# Patient Record
Sex: Female | Born: 1935 | Race: White | Hispanic: No | Smoking: Former smoker
Health system: Southern US, Academic
[De-identification: ages and names within clinical notes are randomized; demographics above are authoritative.]

## PROBLEM LIST (undated history)

## (undated) DIAGNOSIS — I499 Cardiac arrhythmia, unspecified: Secondary | ICD-10-CM

## (undated) DIAGNOSIS — I509 Heart failure, unspecified: Secondary | ICD-10-CM

## (undated) DIAGNOSIS — K449 Diaphragmatic hernia without obstruction or gangrene: Secondary | ICD-10-CM

## (undated) DIAGNOSIS — M1990 Unspecified osteoarthritis, unspecified site: Secondary | ICD-10-CM

## (undated) DIAGNOSIS — E079 Disorder of thyroid, unspecified: Secondary | ICD-10-CM

## (undated) DIAGNOSIS — K219 Gastro-esophageal reflux disease without esophagitis: Secondary | ICD-10-CM

## (undated) DIAGNOSIS — I2699 Other pulmonary embolism without acute cor pulmonale: Secondary | ICD-10-CM

## (undated) DIAGNOSIS — I10 Essential (primary) hypertension: Secondary | ICD-10-CM

## (undated) DIAGNOSIS — Z7901 Long term (current) use of anticoagulants: Secondary | ICD-10-CM

## (undated) DIAGNOSIS — I4891 Unspecified atrial fibrillation: Secondary | ICD-10-CM

## (undated) DIAGNOSIS — R1310 Dysphagia, unspecified: Secondary | ICD-10-CM

## (undated) DIAGNOSIS — J302 Other seasonal allergic rhinitis: Secondary | ICD-10-CM

## (undated) DIAGNOSIS — I739 Peripheral vascular disease, unspecified: Secondary | ICD-10-CM

## (undated) DIAGNOSIS — F0390 Unspecified dementia without behavioral disturbance: Secondary | ICD-10-CM

## (undated) HISTORY — DX: Unspecified atrial fibrillation: I48.91

## (undated) HISTORY — DX: Heart failure, unspecified: I50.9

## (undated) HISTORY — DX: Dysphagia, unspecified: R13.10

## (undated) HISTORY — DX: Disorder of thyroid, unspecified: E07.9

## (undated) HISTORY — PX: COLONOSCOPY: SHX174

## (undated) HISTORY — PX: ELBOW SURGERY: SHX618

## (undated) HISTORY — DX: Other pulmonary embolism without acute cor pulmonale: I26.99

## (undated) HISTORY — DX: Diaphragmatic hernia without obstruction or gangrene: K44.9

## (undated) HISTORY — DX: Cardiac arrhythmia, unspecified: I49.9

## (undated) HISTORY — DX: Gastro-esophageal reflux disease without esophagitis: K21.9

## (undated) HISTORY — DX: Essential (primary) hypertension: I10

## (undated) HISTORY — DX: Long term (current) use of anticoagulants: Z79.01

## (undated) HISTORY — PX: LAPAROSCOPIC HYSTERECTOMY: SHX1926

## (undated) HISTORY — DX: Unspecified osteoarthritis, unspecified site: M19.90

## (undated) HISTORY — DX: Other seasonal allergic rhinitis: J30.2

## (undated) HISTORY — DX: Heart failure, unspecified (CMS HCC): I50.9

## (undated) HISTORY — PX: COLONOSCOPY: WVUENDOPRO10

## (undated) HISTORY — DX: Other pulmonary embolism without acute cor pulmonale (CMS HCC): I26.99

## (undated) HISTORY — DX: Unspecified atrial fibrillation (CMS HCC): I48.91

## (undated) HISTORY — PX: HX HYSTERECTOMY: SHX81

## (undated) HISTORY — PX: COLONOSCOPY, DIAGNOSTIC (SCREENING): SHX174

## (undated) HISTORY — PX: OTHER SURGICAL HISTORY: SHX169

## (undated) HISTORY — PX: HYSTERECTOMY: SHX81

---

## 1993-04-30 ENCOUNTER — Emergency Department: Admit: 1993-04-30 | Disposition: A | Discharge status: Home / Self Care | Payer: Self-pay | Source: Ambulatory Visit

## 1995-06-12 ENCOUNTER — Ambulatory Visit (INDEPENDENT_AMBULATORY_CARE_PROVIDER_SITE_OTHER): Admit: 1995-06-12 | Disposition: A | Discharge status: Home / Self Care | Payer: Self-pay | Source: Ambulatory Visit

## 1995-08-26 ENCOUNTER — Ambulatory Visit: Admit: 1995-08-26 | Disposition: A | Discharge status: Home / Self Care | Payer: Self-pay | Source: Ambulatory Visit

## 1995-09-30 ENCOUNTER — Ambulatory Visit: Admit: 1995-09-30 | Disposition: A | Discharge status: Home / Self Care | Payer: Self-pay | Source: Ambulatory Visit

## 1995-11-24 ENCOUNTER — Inpatient Hospital Stay
Admission: EM | Admit: 1995-11-24 | Disposition: A | Discharge status: Home / Self Care | Payer: Self-pay | Source: Ambulatory Visit

## 1996-08-17 ENCOUNTER — Ambulatory Visit: Admit: 1996-08-17 | Disposition: A | Discharge status: Home / Self Care | Payer: Self-pay | Source: Ambulatory Visit

## 1997-09-18 ENCOUNTER — Ambulatory Visit: Admit: 1997-09-18 | Disposition: A | Discharge status: Home / Self Care | Payer: Self-pay | Source: Ambulatory Visit

## 1998-10-24 ENCOUNTER — Ambulatory Visit
Admission: RE | Admit: 1998-10-24 | Disposition: A | Discharge status: Home / Self Care | Payer: Self-pay | Source: Ambulatory Visit

## 1998-11-06 ENCOUNTER — Ambulatory Visit: Admit: 1998-11-06 | Disposition: A | Discharge status: Home / Self Care | Payer: Self-pay | Source: Ambulatory Visit

## 1998-12-06 ENCOUNTER — Ambulatory Visit
Admission: RE | Admit: 1998-12-06 | Disposition: A | Discharge status: Home / Self Care | Payer: Self-pay | Source: Ambulatory Visit

## 1999-06-27 ENCOUNTER — Ambulatory Visit
Admission: RE | Admit: 1999-06-27 | Disposition: A | Discharge status: Home / Self Care | Payer: Self-pay | Source: Ambulatory Visit

## 1999-12-15 ENCOUNTER — Inpatient Hospital Stay
Admission: EM | Admit: 1999-12-15 | Disposition: A | Discharge status: Home / Self Care | Payer: Self-pay | Source: Ambulatory Visit

## 2000-01-07 ENCOUNTER — Ambulatory Visit
Admission: RE | Admit: 2000-01-07 | Disposition: A | Discharge status: Home / Self Care | Payer: Self-pay | Source: Ambulatory Visit

## 2000-01-13 ENCOUNTER — Ambulatory Visit
Admission: RE | Admit: 2000-01-13 | Disposition: A | Discharge status: Home / Self Care | Payer: Self-pay | Source: Ambulatory Visit

## 2000-02-20 ENCOUNTER — Ambulatory Visit
Admission: RE | Admit: 2000-02-20 | Disposition: A | Discharge status: Home / Self Care | Payer: Self-pay | Source: Ambulatory Visit

## 2000-03-16 ENCOUNTER — Ambulatory Visit: Admit: 2000-03-16 | Disposition: A | Discharge status: Home / Self Care | Payer: Self-pay | Source: Ambulatory Visit

## 2000-10-07 ENCOUNTER — Ambulatory Visit
Admission: RE | Admit: 2000-10-07 | Disposition: A | Discharge status: Home / Self Care | Payer: Self-pay | Source: Ambulatory Visit

## 2000-12-23 ENCOUNTER — Ambulatory Visit
Admission: RE | Admit: 2000-12-23 | Disposition: A | Discharge status: Home / Self Care | Payer: Self-pay | Source: Ambulatory Visit

## 2001-01-07 ENCOUNTER — Ambulatory Visit
Admission: RE | Admit: 2001-01-07 | Disposition: A | Discharge status: Home / Self Care | Payer: Self-pay | Source: Ambulatory Visit

## 2001-01-13 ENCOUNTER — Ambulatory Visit
Admission: RE | Admit: 2001-01-13 | Disposition: A | Discharge status: Home / Self Care | Payer: Self-pay | Source: Ambulatory Visit

## 2002-01-20 ENCOUNTER — Ambulatory Visit: Admit: 2002-01-20 | Disposition: A | Discharge status: Home / Self Care | Payer: Self-pay | Source: Ambulatory Visit

## 2002-02-07 ENCOUNTER — Ambulatory Visit
Admission: RE | Admit: 2002-02-07 | Disposition: A | Discharge status: Home / Self Care | Payer: Self-pay | Source: Ambulatory Visit

## 2002-02-18 ENCOUNTER — Ambulatory Visit
Admission: RE | Admit: 2002-02-18 | Disposition: A | Discharge status: Home / Self Care | Payer: Self-pay | Source: Ambulatory Visit

## 2002-08-05 ENCOUNTER — Emergency Department
Admission: EM | Admit: 2002-08-05 | Disposition: A | Discharge status: Home / Self Care | Payer: Self-pay | Source: Ambulatory Visit

## 2003-04-03 ENCOUNTER — Ambulatory Visit
Admission: RE | Admit: 2003-04-03 | Disposition: A | Discharge status: Home / Self Care | Payer: Self-pay | Source: Ambulatory Visit

## 2003-05-17 ENCOUNTER — Ambulatory Visit
Admission: RE | Admit: 2003-05-17 | Disposition: A | Discharge status: Home / Self Care | Payer: Self-pay | Source: Ambulatory Visit

## 2003-06-07 ENCOUNTER — Ambulatory Visit
Admission: RE | Admit: 2003-06-07 | Disposition: A | Discharge status: Home / Self Care | Payer: Self-pay | Source: Ambulatory Visit

## 2003-09-06 ENCOUNTER — Emergency Department
Admission: EM | Admit: 2003-09-06 | Disposition: A | Discharge status: Home / Self Care | Payer: Self-pay | Source: Ambulatory Visit

## 2004-02-23 ENCOUNTER — Ambulatory Visit (INDEPENDENT_AMBULATORY_CARE_PROVIDER_SITE_OTHER): Admit: 2004-02-23 | Disposition: A | Discharge status: Home / Self Care | Payer: Self-pay | Source: Ambulatory Visit

## 2004-03-11 ENCOUNTER — Ambulatory Visit (INDEPENDENT_AMBULATORY_CARE_PROVIDER_SITE_OTHER): Admit: 2004-03-11 | Disposition: A | Discharge status: Home / Self Care | Payer: Self-pay | Source: Ambulatory Visit

## 2004-07-01 ENCOUNTER — Ambulatory Visit
Admission: RE | Admit: 2004-07-01 | Disposition: A | Discharge status: Home / Self Care | Payer: Self-pay | Source: Ambulatory Visit

## 2004-11-21 ENCOUNTER — Ambulatory Visit
Admission: RE | Admit: 2004-11-21 | Disposition: A | Discharge status: Home / Self Care | Payer: Self-pay | Source: Ambulatory Visit

## 2004-12-14 ENCOUNTER — Ambulatory Visit (INDEPENDENT_AMBULATORY_CARE_PROVIDER_SITE_OTHER): Admit: 2004-12-14 | Disposition: A | Discharge status: Home / Self Care | Payer: Self-pay | Source: Ambulatory Visit

## 2004-12-18 ENCOUNTER — Ambulatory Visit: Admit: 2004-12-18 | Disposition: A | Discharge status: Home / Self Care | Payer: Self-pay | Source: Ambulatory Visit

## 2005-04-27 ENCOUNTER — Emergency Department
Admission: EM | Admit: 2005-04-27 | Disposition: A | Discharge status: Home / Self Care | Payer: Self-pay | Source: Ambulatory Visit

## 2005-06-09 ENCOUNTER — Ambulatory Visit (INDEPENDENT_AMBULATORY_CARE_PROVIDER_SITE_OTHER): Admit: 2005-06-09 | Disposition: A | Discharge status: Home / Self Care | Payer: Self-pay | Source: Ambulatory Visit

## 2005-07-23 ENCOUNTER — Ambulatory Visit
Admission: RE | Admit: 2005-07-23 | Disposition: A | Discharge status: Home / Self Care | Payer: Self-pay | Source: Ambulatory Visit

## 2005-08-06 ENCOUNTER — Ambulatory Visit
Admission: RE | Admit: 2005-08-06 | Disposition: A | Discharge status: Home / Self Care | Payer: Self-pay | Source: Ambulatory Visit

## 2006-05-25 ENCOUNTER — Ambulatory Visit: Admit: 2006-05-25 | Disposition: A | Discharge status: Home / Self Care | Payer: Self-pay | Source: Ambulatory Visit

## 2006-06-01 ENCOUNTER — Ambulatory Visit
Admission: RE | Admit: 2006-06-01 | Disposition: A | Discharge status: Home / Self Care | Payer: Self-pay | Source: Ambulatory Visit

## 2006-06-15 ENCOUNTER — Ambulatory Visit
Admission: RE | Admit: 2006-06-15 | Disposition: A | Discharge status: Home / Self Care | Payer: Self-pay | Source: Ambulatory Visit

## 2006-10-28 ENCOUNTER — Ambulatory Visit
Admission: RE | Admit: 2006-10-28 | Disposition: A | Discharge status: Home / Self Care | Payer: Self-pay | Source: Ambulatory Visit

## 2007-01-18 ENCOUNTER — Ambulatory Visit
Admission: RE | Admit: 2007-01-18 | Disposition: A | Discharge status: Home / Self Care | Payer: Self-pay | Source: Ambulatory Visit

## 2007-01-22 ENCOUNTER — Inpatient Hospital Stay
Admission: EM | Admit: 2007-01-22 | Disposition: A | Discharge status: Home / Self Care | Payer: Self-pay | Source: Ambulatory Visit

## 2007-04-13 ENCOUNTER — Ambulatory Visit
Admission: RE | Admit: 2007-04-13 | Disposition: A | Discharge status: Home / Self Care | Payer: Self-pay | Source: Ambulatory Visit

## 2007-05-06 ENCOUNTER — Ambulatory Visit
Admission: RE | Admit: 2007-05-06 | Disposition: A | Discharge status: Home / Self Care | Payer: Self-pay | Source: Ambulatory Visit

## 2007-06-25 ENCOUNTER — Observation Stay
Admission: EM | Admit: 2007-06-25 | Disposition: A | Discharge status: Home / Self Care | Payer: Self-pay | Source: Ambulatory Visit

## 2007-07-25 ENCOUNTER — Emergency Department
Admission: EM | Admit: 2007-07-25 | Disposition: A | Discharge status: Home / Self Care | Payer: Self-pay | Source: Ambulatory Visit

## 2007-08-24 ENCOUNTER — Emergency Department
Admission: EM | Admit: 2007-08-24 | Disposition: A | Discharge status: Home / Self Care | Payer: Self-pay | Source: Ambulatory Visit

## 2007-08-26 ENCOUNTER — Observation Stay
Admission: RE | Admit: 2007-08-26 | Disposition: A | Discharge status: Home / Self Care | Payer: Self-pay | Source: Ambulatory Visit

## 2008-08-21 ENCOUNTER — Ambulatory Visit
Admission: RE | Admit: 2008-08-21 | Disposition: A | Discharge status: Home / Self Care | Payer: Self-pay | Source: Ambulatory Visit

## 2008-09-24 ENCOUNTER — Observation Stay
Admission: EM | Admit: 2008-09-24 | Disposition: A | Discharge status: Home / Self Care | Payer: Self-pay | Source: Ambulatory Visit | Admitting: Internal Medicine

## 2008-11-16 ENCOUNTER — Ambulatory Visit (INDEPENDENT_AMBULATORY_CARE_PROVIDER_SITE_OTHER): Admit: 2008-11-16 | Disposition: A | Discharge status: Home / Self Care | Payer: Self-pay | Source: Ambulatory Visit

## 2008-12-25 DIAGNOSIS — E7800 Pure hypercholesterolemia, unspecified: Secondary | ICD-10-CM | POA: Insufficient documentation

## 2008-12-25 DIAGNOSIS — F411 Generalized anxiety disorder: Secondary | ICD-10-CM | POA: Insufficient documentation

## 2008-12-25 DIAGNOSIS — E039 Hypothyroidism, unspecified: Secondary | ICD-10-CM | POA: Diagnosis present

## 2008-12-25 DIAGNOSIS — K219 Gastro-esophageal reflux disease without esophagitis: Secondary | ICD-10-CM | POA: Diagnosis present

## 2009-04-04 DIAGNOSIS — J449 Chronic obstructive pulmonary disease, unspecified: Secondary | ICD-10-CM | POA: Insufficient documentation

## 2009-04-19 ENCOUNTER — Ambulatory Visit
Admission: RE | Admit: 2009-04-19 | Disposition: A | Discharge status: Home / Self Care | Payer: Self-pay | Source: Ambulatory Visit

## 2009-05-08 ENCOUNTER — Ambulatory Visit
Admission: RE | Admit: 2009-05-08 | Disposition: A | Discharge status: Home / Self Care | Payer: Self-pay | Source: Ambulatory Visit

## 2009-05-25 ENCOUNTER — Inpatient Hospital Stay
Admission: EM | Admit: 2009-05-25 | Disposition: A | Discharge status: Home / Self Care | Payer: Self-pay | Source: Ambulatory Visit | Admitting: Internal Medicine

## 2009-06-19 ENCOUNTER — Ambulatory Visit
Admission: RE | Admit: 2009-06-19 | Disposition: A | Discharge status: Home / Self Care | Payer: Self-pay | Source: Ambulatory Visit

## 2009-06-20 DIAGNOSIS — I4821 Permanent atrial fibrillation: Secondary | ICD-10-CM | POA: Diagnosis present

## 2009-09-19 ENCOUNTER — Ambulatory Visit
Admission: RE | Admit: 2009-09-19 | Disposition: A | Discharge status: Home / Self Care | Payer: Self-pay | Source: Ambulatory Visit

## 2010-05-06 ENCOUNTER — Ambulatory Visit
Admission: RE | Admit: 2010-05-06 | Disposition: A | Discharge status: Home / Self Care | Payer: Self-pay | Source: Ambulatory Visit

## 2010-05-27 ENCOUNTER — Ambulatory Visit: Admit: 2010-05-27 | Disposition: A | Discharge status: Home / Self Care | Payer: Self-pay | Source: Ambulatory Visit

## 2010-07-25 ENCOUNTER — Ambulatory Visit (INDEPENDENT_AMBULATORY_CARE_PROVIDER_SITE_OTHER): Admit: 2010-07-25 | Disposition: A | Discharge status: Home / Self Care | Payer: Self-pay | Source: Ambulatory Visit

## 2010-07-25 ENCOUNTER — Emergency Department
Admission: EM | Admit: 2010-07-25 | Disposition: A | Discharge status: Home / Self Care | Payer: Self-pay | Source: Ambulatory Visit

## 2011-08-07 ENCOUNTER — Ambulatory Visit
Admission: RE | Admit: 2011-08-07 | Disposition: A | Discharge status: Home / Self Care | Payer: Self-pay | Source: Ambulatory Visit

## 2013-10-14 LAB — ECG 12-LEAD
P Wave Axis: 56 deg
P Wave Axis: 68 deg
P Wave Axis: -40 deg
P Wave Axis: -66 deg
P Wave Axis: 65 deg
P Wave Duration: 102 ms
P Wave Duration: 118 ms
P Wave Duration: 128 ms
P Wave Duration: 76 ms
P Wave Duration: 82 ms
P-R Interval: 158 ms
P-R Interval: 170 ms
P-R Interval: 116 ms
P-R Interval: 124 ms
P-R Interval: 166 ms
Patient Age: 80 years
Patient Age: 80 years
Patient Age: 81 years
Patient Age: 81 years
Patient Age: 81 years
Patient Age: 81 years
Patient Age: 82 years
Q-T Dispersion: 14 ms
Q-T Dispersion: 26 ms
Q-T Dispersion: 18 ms
Q-T Dispersion: 28 ms
Q-T Dispersion: 32 ms
Q-T Dispersion: 38 ms
Q-T Dispersion: 50 ms
Q-T Interval(Corrected): 414 ms
Q-T Interval(Corrected): 442 ms
Q-T Interval(Corrected): 430 ms
Q-T Interval(Corrected): 449 ms
Q-T Interval(Corrected): 453 ms
Q-T Interval(Corrected): 460 ms
Q-T Interval(Corrected): 448 ms
Q-T Interval: 414 ms
Q-T Interval: 448 ms
Q-T Interval: 280 ms
Q-T Interval: 436 ms
Q-T Interval: 476 ms
Q-T Interval: 480 ms
Q-T Interval: 452 ms
QRS Axis: -24 deg
QRS Axis: -4 deg
QRS Axis: -1 deg
QRS Axis: -12 deg
QRS Axis: -13 deg
QRS Axis: -21 deg
QRS Axis: -4 deg
QRS Duration: 88 ms
QRS Duration: 90 ms
QRS Duration: 78 ms
QRS Duration: 84 ms
QRS Duration: 86 ms
QRS Duration: 90 ms
QRS Duration: 92 ms
T Axis: 3 deg
T Axis: 6 deg
T Axis: -1 deg
T Axis: 33 deg
T Axis: 37 deg
T Axis: 8 deg
T Axis: 11 deg
Ventricular Rate: 57 /min
Ventricular Rate: 60 /min
Ventricular Rate: 157 /min
Ventricular Rate: 45 /min
Ventricular Rate: 51 /min
Ventricular Rate: 57 /min
Ventricular Rate: 58 /min

## 2014-03-29 ENCOUNTER — Emergency Department: Payer: Medicare Other

## 2014-03-29 ENCOUNTER — Emergency Department
Admission: EM | Admit: 2014-03-29 | Discharge: 2014-03-29 | Disposition: A | Discharge status: Home / Self Care | Payer: Medicare Other | Attending: Emergency Medicine | Admitting: Emergency Medicine

## 2014-03-29 DIAGNOSIS — M25559 Pain in unspecified hip: Secondary | ICD-10-CM | POA: Insufficient documentation

## 2014-03-29 DIAGNOSIS — M5431 Sciatica, right side: Secondary | ICD-10-CM

## 2014-03-29 DIAGNOSIS — I10 Essential (primary) hypertension: Secondary | ICD-10-CM | POA: Insufficient documentation

## 2014-03-29 DIAGNOSIS — M5430 Sciatica, unspecified side: Secondary | ICD-10-CM | POA: Insufficient documentation

## 2014-03-29 HISTORY — DX: Essential (primary) hypertension: I10

## 2014-03-29 HISTORY — DX: Other pulmonary embolism without acute cor pulmonale: I26.99

## 2014-03-29 MED ORDER — LIDOCAINE 5 % EX PTCH
1.0000 | MEDICATED_PATCH | Freq: Every day | CUTANEOUS | Status: DC
Start: 2014-03-29 — End: 2014-05-03

## 2014-03-29 MED ORDER — GABAPENTIN 100 MG PO CAPS
200.0000 mg | ORAL_CAPSULE | Freq: Three times a day (TID) | ORAL | Status: DC
Start: 2014-03-29 — End: 2014-03-29

## 2014-03-29 MED ORDER — HYDROMORPHONE HCL 2 MG PO TABS
2.0000 mg | ORAL_TABLET | ORAL | Status: DC | PRN
Start: 2014-03-29 — End: 2014-03-29
  Administered 2014-03-29: 2 mg via ORAL

## 2014-03-29 MED ORDER — GABAPENTIN 300 MG PO CAPS
300.0000 mg | ORAL_CAPSULE | Freq: Two times a day (BID) | ORAL | Status: DC
Start: 2014-03-29 — End: 2014-05-05

## 2014-03-29 MED ORDER — PROMETHAZINE HCL 25 MG PO TABS
12.5000 mg | ORAL_TABLET | Freq: Once | ORAL | Status: AC
Start: 2014-03-29 — End: 2014-03-29
  Administered 2014-03-29: 12.5 mg via ORAL

## 2014-03-29 MED ORDER — HYDROMORPHONE HCL 2 MG PO TABS
ORAL_TABLET | ORAL | Status: AC
Start: 2014-03-29 — End: ?
  Filled 2014-03-29: qty 1

## 2014-03-29 MED ORDER — PROMETHAZINE HCL 25 MG PO TABS
ORAL_TABLET | ORAL | Status: AC
Start: 2014-03-29 — End: ?
  Filled 2014-03-29: qty 1

## 2014-03-29 NOTE — ED Notes (Signed)
Bed: E50-A  Expected date:   Expected time:   Means of arrival:   Comments:  Ems

## 2014-03-29 NOTE — ED Notes (Signed)
Pt has been having right hip pain x1 month. Pt was seen by PCP and dx with arthritis last week. Pt has been started on Gabapentin. Today pt has had increasing right hip pain that radiates down leg. Pt states she thinks its sciatica pain and not related to arthritis. Pt has no hx of sciatica. Pt walks with walker.

## 2014-03-29 NOTE — ED Provider Notes (Addendum)
Physician/Midlevel provider first contact with patient: 03/29/14 1718         History     Chief Complaint   Patient presents with   . Hip Pain     HPI Comments: Pt has had right hip pain throughout the past 2 months that has begun worsening today. The pain originates in her right buttock. The pain radiates down to her right ankle through her right thigh. The pain feels like burning. Pt has chronic ankle swelling. Pt has 2 bad knees. Pt has bad circulation. Pt especially pained by bearing weight. Certain positions have relieved the pain somewhat. Pt has not tried anything for her pain. She has been using a walker. She is not sure whether it is due to the way she lied down to sleep last night. No back pain. No leg numbness. Pt is on a blood thinner, so she normally does not take pain medicine. Pt has had blood clots in her leg and lungs. Pt has had an allergic reaction to a certain type of pain relieving patch.     Patient is a 80 y.o. female presenting with hip pain. The history is provided by the patient.   Hip Pain  This is a new problem. The current episode started more than 1 month ago (2 months). The problem occurs constantly. The problem has been gradually worsening. Pertinent negatives include no joint swelling, numbness or urinary symptoms. The symptoms are aggravated by walking and standing (bearing weight, certain positions). She has tried position changes for the symptoms. The treatment provided mild relief.         Past Medical History   Diagnosis Date   . HTN (hypertension)    . Pulmonary embolism        Past Surgical History   Procedure Laterality Date   . Left arm     . Hysterectomy         History reviewed. No pertinent family history.    Social  History   Substance Use Topics   . Smoking status: Never Smoker    . Smokeless tobacco: Never Used   . Alcohol Use: No       .     Allergies   Allergen Reactions   . Codeine    . Erythromycin    . Iodine    . Penicillins    . Sulfa Antibiotics    . Tramadol     . Zithromax [Azithromycin]        Discharge Medication List as of 03/29/2014  8:21 PM      CONTINUE these medications which have NOT CHANGED    Details   levothyroxine (SYNTHROID, LEVOTHROID) 75 MCG tablet Take 75 mcg by mouth Once a day at 6:00am., Until Discontinued, Historical Med      losartan (COZAAR) 100 MG tablet Take 100 mg by mouth daily., Until Discontinued, Historical Med      metoprolol (LOPRESSOR) 50 MG tablet Take 50 mg by mouth 2 (two) times daily., Until Discontinued, Historical Med      pantoprazole (PROTONIX) 40 MG tablet Take 40 mg by mouth daily., Until Discontinued, Historical Med      warfarin (COUMADIN) 2.5 MG tablet Take 2.5 mg by mouth daily., Until Discontinued, Historical Med              Review of Systems   Genitourinary: Negative for difficulty urinating.   Musculoskeletal: Positive for gait problem. Negative for back pain and joint swelling.  Pt can barely walk due to pain. She has been using a walker. Chronic ankle swelling. Pt has had right hip pain throughout the past 2 months that has begun worsening today. The pain originates in her right buttock. The pain radiates down to her right ankle through her right thigh. The pain feels like burning. Chronic ankle swelling    Neurological: Negative for numbness.   All other systems reviewed and are negative.      Physical Exam    BP: 184/91 mmHg, Heart Rate: 94, Temp: 98.6 F (37 C), Resp Rate: 16, SpO2: 97 %    Physical Exam   Constitutional: She is oriented to person, place, and time. She appears well-developed and well-nourished. No distress.   HENT:   Head: Normocephalic and atraumatic.   Right Ear: External ear normal.   Left Ear: External ear normal.   Nose: Nose normal.   Mouth/Throat: Oropharynx is clear and moist. No oropharyngeal exudate.   Eyes: Conjunctivae and EOM are normal. Right eye exhibits no discharge. Left eye exhibits no discharge. No scleral icterus.   Neck: Neck supple. No tracheal deviation present. No  thyromegaly present.   Cardiovascular: Normal rate, regular rhythm and intact distal pulses.  Exam reveals no gallop and no friction rub.    No murmur heard.  Chronic veinous stasis changes. Diminished distal pulses in both feet   Pulmonary/Chest: Effort normal and breath sounds normal. No stridor. No respiratory distress. She has no wheezes. She has no rales. She exhibits no tenderness.   Abdominal: Bowel sounds are normal. She exhibits no distension and no mass. There is no tenderness. There is no rebound and no guarding.   Musculoskeletal: She exhibits tenderness. She exhibits no edema.   Tender in the buttock. Pain with straight leg raising in right leg. Pain with back flexion and rotation.    Lymphadenopathy:     She has no cervical adenopathy.   Neurological: She is alert and oriented to person, place, and time. She exhibits normal muscle tone. Coordination normal.   Skin: Skin is warm and dry. No rash noted. She is not diaphoretic. No erythema. No pallor.   Feet cold to touch. hyperpigmentary changes in her lower legs   Psychiatric: She has a normal mood and affect. Her behavior is normal. Thought content normal.   Pt is difficult to try to medicate due to her numerous allergies. Pt says physical therapy does not work. She was told that she could not have a nerve stimulator due to her coumadin regimen.    Nursing note and vitals reviewed.      MDM and ED Course     ED Medication Orders    Start     Status Ordering Provider    03/29/14 1732  promethazine (PHENERGAN) tablet 12.5 mg   Once in ED     Route: Oral  Ordered Dose: 12.5 mg     Last MAR action:  Given Pari Lombard, Kristine Garbe    03/29/14 1731  HYDROmorphone (DILAUDID) tablet 2 mg   Every 3 hours PRN     Route: Oral  Ordered Dose: 2 mg     Last MAR action:  Given Sylvana Bonk, Kristine Garbe           MDM  Number of Diagnoses or Management Options  Sciatica, right:   Diagnosis management comments: DDx:  1. Sciatica  2. Peripheral vascular disease  3. Arthritis  4.  Deconditioning      She refused a blood pressure  reading on her right leg due to pain, as well as a percocet due to anticipated adverse reactions.          Amount and/or Complexity of Data Reviewed  Tests in the radiology section of CPT: ordered and reviewed          Procedures    Clinical Impression & Disposition     Clinical Impression  Final diagnoses:   Sciatica, right        ED Disposition    Discharge [facility] discharge to home/self care.    Condition at disposition: Stable             Discharge Medication List as of 03/29/2014  8:21 PM      START taking these medications    Details   lidocaine (LIDODERM) 5 % Place 1 patch onto the skin daily. Remove & Discard patch within 12 hours or as directed by MD, Starting 03/29/2014, Until Discontinued, Print                Myna Hidalgo, MD  03/29/14 2227

## 2014-03-29 NOTE — Discharge Instructions (Addendum)
Understanding Sciatica  Sciatica is leg pain often due to pressure on a nerve in your low back that connects to the sciatic nerve. This pressure may be caused by a damaged disk or by abnormal bone growth. You may feel pain, burning, tingling, or numbness that shoots down your leg.   Pressure from a Damaged Disk  Constant wear and tear on a disk can cause it to weaken. Part of the disk may push outward (herniate). Part of the disk may then press on nearby nerves. If this nerve leads to the sciatic nerve, you may feel pain down your leg.   Pressure from Bone  A disk can wear out and thin with age. This can cause the vertebrae above and below the disk to touch, putting pressure on a nerve. Abnormal bone growths called bone spurs can also form where the bones rub together. These can narrow the spinal canal and press on nerves.     2000-2014 [employer] [address] [place], [place] [address]. All rights reserved. This information is not intended as a substitute for professional medical care. Always follow your healthcare professional's instructions.      Sciatica    Sciatica ("Lumbar Radiculopathy") causes a pain that spreads from the lower back down into the buttock, hip and leg. Sometimes leg pain can occur without any back pain. Sciatica is due to irritation or pressure on a spinal nerve as it comes out of the spinal canal. This is most often due to a bulge or rupture of a nearby spinal disk (the cartilage cushion between each spinal bone), which presses on a nearby nerve. Other causes include spinal stenosis (narrowing of the spinal canal) and spasm of the pyriform muscle (a muscle in the buttocks that the sciatic nerve passes through).  Sciatica may begin after a sudden twisting/bending force (such as in a car accident), or sometimes after a simple awkward movement. In either case, muscle spasm is commonly present and contributes to the pain.  The diagnosis of sciatica is made from the  symptoms and physical exam. Unless you had a physical injury (such as a car accident or fall), X-rays are usually not ordered for the initial evaluation of sciatica because the nerves and disks cannot be seen on an x-ray. If signs of a compressed nerve are present (for example, loss of tendon reflex or strength in the leg), an MRI (magnetic resonance imaging) scan will need to be scheduled as an outpatient.  Most sciatica (80-90%) gets better with medicine, exercise, physical therapy. If symptoms continue after at least three months of medical treatment, surgery may be considered.  Home Care:  1. You may need to stay in bed the first few days. But, as soon as possible, begin sitting or walking to avoid problems with prolonged bed rest.  2. When in bed, try to find a position of comfort. A firm mattress is best. Try lying flat on your back with pillows under your knees. You can also try lying on your side with your knees bent up towards your chest and a pillow between your knees.  3. Avoid prolonged sitting. This puts more stress on the lower back than standing or walking.  4. Some persons find relief with heat (hot shower, hot bath or heating pad) and massage, while others prefer cold packs (crushed or cubed ice in a plastic bag, wrapped in a towel). Try both and use the method that feels best for 20 minutes several times a day.  5. You may use acetaminophen (Tylenol) or ibuprofen (Motrin, Advil) to control pain, unless another pain medicine was prescribed. [ NOTE: If you have chronic liver or kidney disease or ever had a stomach ulcer or GI bleeding, talk with your doctor before using these medicines.]  6. Be aware of safe lifting methods and do not lift anything over 15 pounds until all the pain is gone.  Follow Up  with your doctor or this facility if your symptoms do not start to improve after one week. Physical therapy or further testing may be needed.  [NOTE: If X-rays were taken, they will be reviewed by a  radiologist. You will be notified of any new findings that may affect your care.]  Get Prompt Medical Attention  if any of the following occur:   Pain becomes worse, not controlled by the prescribed medicine   Weakness or numbness in one or both legs   Numbness in the groin, genital area   Loss of bowel or bladder control   2000-2014 [employer] [address] [place], [place] [address]. All rights reserved. This information is not intended as a substitute for professional medical care. Always follow your healthcare professional's instructions.

## 2014-03-30 NOTE — ED Notes (Signed)
lidoderm patch requires a pre-auth. ED does not get involved with prior auths as a rule. Since pt pain seems somewhat chronic, recommend follow-up with Dr Florian Buff and let his office attempt prior auth if still deemed necessary.

## 2014-05-03 ENCOUNTER — Inpatient Hospital Stay: Payer: Medicare Other | Admitting: Internal Medicine

## 2014-05-03 ENCOUNTER — Inpatient Hospital Stay
Admission: EM | Admit: 2014-05-03 | Discharge: 2014-05-05 | DRG: 293 | Disposition: A | Discharge status: Home / Self Care | Payer: Medicare Other | Attending: Internal Medicine | Admitting: Internal Medicine

## 2014-05-03 ENCOUNTER — Emergency Department: Payer: Medicare Other

## 2014-05-03 DIAGNOSIS — I739 Peripheral vascular disease, unspecified: Secondary | ICD-10-CM | POA: Diagnosis present

## 2014-05-03 DIAGNOSIS — Z7901 Long term (current) use of anticoagulants: Secondary | ICD-10-CM

## 2014-05-03 DIAGNOSIS — I2789 Other specified pulmonary heart diseases: Secondary | ICD-10-CM | POA: Diagnosis present

## 2014-05-03 DIAGNOSIS — E039 Hypothyroidism, unspecified: Secondary | ICD-10-CM | POA: Diagnosis present

## 2014-05-03 DIAGNOSIS — R1310 Dysphagia, unspecified: Secondary | ICD-10-CM

## 2014-05-03 DIAGNOSIS — I509 Heart failure, unspecified: Principal | ICD-10-CM | POA: Diagnosis present

## 2014-05-03 DIAGNOSIS — Z86711 Personal history of pulmonary embolism: Secondary | ICD-10-CM | POA: Diagnosis present

## 2014-05-03 DIAGNOSIS — R079 Chest pain, unspecified: Secondary | ICD-10-CM | POA: Diagnosis present

## 2014-05-03 DIAGNOSIS — M79609 Pain in unspecified limb: Secondary | ICD-10-CM | POA: Diagnosis present

## 2014-05-03 DIAGNOSIS — Z881 Allergy status to other antibiotic agents status: Secondary | ICD-10-CM

## 2014-05-03 DIAGNOSIS — I4891 Unspecified atrial fibrillation: Secondary | ICD-10-CM | POA: Diagnosis present

## 2014-05-03 DIAGNOSIS — G8929 Other chronic pain: Secondary | ICD-10-CM | POA: Diagnosis present

## 2014-05-03 DIAGNOSIS — I129 Hypertensive chronic kidney disease with stage 1 through stage 4 chronic kidney disease, or unspecified chronic kidney disease: Secondary | ICD-10-CM | POA: Diagnosis present

## 2014-05-03 DIAGNOSIS — Z880 Allergy status to penicillin: Secondary | ICD-10-CM

## 2014-05-03 DIAGNOSIS — Z9109 Other allergy status, other than to drugs and biological substances: Secondary | ICD-10-CM

## 2014-05-03 DIAGNOSIS — I10 Essential (primary) hypertension: Secondary | ICD-10-CM | POA: Diagnosis present

## 2014-05-03 DIAGNOSIS — K209 Esophagitis, unspecified without bleeding: Secondary | ICD-10-CM | POA: Diagnosis present

## 2014-05-03 DIAGNOSIS — I4819 Other persistent atrial fibrillation: Secondary | ICD-10-CM | POA: Diagnosis present

## 2014-05-03 DIAGNOSIS — R0789 Other chest pain: Secondary | ICD-10-CM

## 2014-05-03 DIAGNOSIS — Z883 Allergy status to other anti-infective agents status: Secondary | ICD-10-CM

## 2014-05-03 DIAGNOSIS — K219 Gastro-esophageal reflux disease without esophagitis: Secondary | ICD-10-CM | POA: Diagnosis present

## 2014-05-03 DIAGNOSIS — I2699 Other pulmonary embolism without acute cor pulmonale: Secondary | ICD-10-CM | POA: Diagnosis present

## 2014-05-03 DIAGNOSIS — Z90710 Acquired absence of both cervix and uterus: Secondary | ICD-10-CM

## 2014-05-03 DIAGNOSIS — Z91199 Patient's noncompliance with other medical treatment and regimen due to unspecified reason: Secondary | ICD-10-CM

## 2014-05-03 DIAGNOSIS — J810 Acute pulmonary edema: Secondary | ICD-10-CM

## 2014-05-03 DIAGNOSIS — N183 Chronic kidney disease, stage 3 unspecified: Secondary | ICD-10-CM | POA: Diagnosis present

## 2014-05-03 DIAGNOSIS — Z882 Allergy status to sulfonamides status: Secondary | ICD-10-CM

## 2014-05-03 DIAGNOSIS — Z886 Allergy status to analgesic agent status: Secondary | ICD-10-CM

## 2014-05-03 DIAGNOSIS — Z86718 Personal history of other venous thrombosis and embolism: Secondary | ICD-10-CM

## 2014-05-03 DIAGNOSIS — Z885 Allergy status to narcotic agent status: Secondary | ICD-10-CM

## 2014-05-03 HISTORY — DX: Disorder of thyroid, unspecified: E07.9

## 2014-05-03 HISTORY — DX: Peripheral vascular disease, unspecified: I73.9

## 2014-05-03 LAB — CBC AND DIFFERENTIAL
Basophils %: 0.8 % (ref 0.0–3.0)
Basophils Absolute: 0.1 10*3/uL (ref 0.0–0.3)
Eosinophils %: 2.1 % (ref 0.0–7.0)
Eosinophils Absolute: 0.1 10*3/uL (ref 0.0–0.8)
Hematocrit: 45.4 % (ref 36.0–48.0)
Hemoglobin: 15 gm/dL (ref 12.0–16.0)
Lymphocytes Absolute: 2.1 10*3/uL (ref 0.6–5.1)
Lymphocytes: 28.2 % (ref 15.0–46.0)
MCH: 30 pg (ref 28–35)
MCHC: 33 gm/dL (ref 32–36)
MCV: 92 fL (ref 80–100)
MPV: 8.2 fL (ref 6.0–10.0)
Monocytes Absolute: 0.6 10*3/uL (ref 0.1–1.7)
Monocytes: 8.7 % (ref 3.0–15.0)
Neutrophils %: 60.3 % (ref 42.0–78.0)
Neutrophils Absolute: 4.4 10*3/uL (ref 1.7–8.6)
PLT CT: 205 10*3/uL (ref 130–440)
RBC: 4.94 10*6/uL (ref 3.80–5.00)
RDW: 14 % (ref 11.0–14.0)
WBC: 7.3 10*3/uL (ref 4.0–11.0)

## 2014-05-03 LAB — ECG 12-LEAD
Patient Age: 86 years
Q-T Dispersion: 84 ms
Q-T Interval(Corrected): 438 ms
Q-T Interval: 400 ms
QRS Axis: 2 deg
QRS Duration: 90 ms
T Axis: -8 deg
Ventricular Rate: 72 /min

## 2014-05-03 LAB — I-STAT CHEM 8 CARTRIDGE
Anion Gap I-Stat: 14 (ref 7–16)
BUN I-Stat: 9 mg/dL (ref 6–20)
Calcium Ionized I-Stat: 4.9 mg/dL (ref 4.35–5.10)
Chloride I-Stat: 102 mMol/L (ref 98–112)
Creatinine I-Stat: 1.1 mg/dL (ref 0.60–1.10)
EGFR: 47 mL/min/{1.73_m2}
Glucose I-Stat: 98 mg/dL (ref 70–99)
Hematocrit I-Stat: 44 % (ref 36.0–48.0)
Hemoglobin I-Stat: 15 gm/dL (ref 12.0–16.0)
Potassium I-Stat: 4.2 mMol/L (ref 3.5–5.3)
Sodium I-Stat: 139 mMol/L (ref 135–145)
TCO2 I-Stat: 29 mMol/L (ref 24–29)

## 2014-05-03 LAB — TROPONIN I: Troponin I: 0.01 ng/mL (ref 0.00–0.02)

## 2014-05-03 LAB — I-STAT TROPONIN: Troponin I I-Stat: <0.02 ng/mL (ref 0.00–0.02)

## 2014-05-03 LAB — B-TYPE NATRIURETIC PEPTIDE: B-Natriuretic Peptide: 227.4 pg/mL — ABNORMAL HIGH (ref 0.0–100.0)

## 2014-05-03 LAB — VH I-STAT CHEM 8 NOTIFICATION

## 2014-05-03 LAB — VH I-STAT INR: i-STAT INR: 2.1 (ref 0.0–3.0)

## 2014-05-03 LAB — VH I-STAT TROPONIN NOTIFICATION

## 2014-05-03 LAB — CK: Creatine Kinase (CK): 60 U/L (ref 30–189)

## 2014-05-03 LAB — VH I-STAT INR NOTIFICATION

## 2014-05-03 MED ORDER — VITAMIN D 1000 UNITS PO TABS
1.0000 | ORAL_TABLET | Freq: Two times a day (BID) | ORAL | Status: DC
Start: 2014-05-04 — End: 2014-05-05
  Administered 2014-05-04 – 2014-05-05 (×3): 1000 [IU] via ORAL
  Filled 2014-05-03 (×4): qty 1

## 2014-05-03 MED ORDER — FUROSEMIDE 10 MG/ML IJ SOLN
INTRAMUSCULAR | Status: AC
Start: 2014-05-03 — End: ?
  Filled 2014-05-03: qty 4

## 2014-05-03 MED ORDER — GABAPENTIN 300 MG PO CAPS
300.0000 mg | ORAL_CAPSULE | Freq: Two times a day (BID) | ORAL | Status: DC
Start: 2014-05-03 — End: 2014-05-05
  Administered 2014-05-03 – 2014-05-04 (×3): 300 mg via ORAL
  Filled 2014-05-03 (×5): qty 1

## 2014-05-03 MED ORDER — ONDANSETRON HCL 4 MG/2ML IJ SOLN
4.0000 mg | Freq: Three times a day (TID) | INTRAMUSCULAR | Status: DC | PRN
Start: 2014-05-03 — End: 2014-05-05

## 2014-05-03 MED ORDER — METOPROLOL TARTRATE 50 MG PO TABS
50.0000 mg | ORAL_TABLET | Freq: Two times a day (BID) | ORAL | Status: DC
Start: 2014-05-03 — End: 2014-05-05
  Administered 2014-05-03 – 2014-05-05 (×4): 50 mg via ORAL
  Filled 2014-05-03 (×5): qty 1

## 2014-05-03 MED ORDER — ACETAMINOPHEN 325 MG PO TABS
650.0000 mg | ORAL_TABLET | ORAL | Status: DC | PRN
Start: 2014-05-03 — End: 2014-05-05

## 2014-05-03 MED ORDER — LOSARTAN POTASSIUM 50 MG PO TABS
100.0000 mg | ORAL_TABLET | Freq: Every day | ORAL | Status: DC
Start: 2014-05-04 — End: 2014-05-05
  Administered 2014-05-04 – 2014-05-05 (×2): 100 mg via ORAL
  Filled 2014-05-03 (×2): qty 2

## 2014-05-03 MED ORDER — FUROSEMIDE 10 MG/ML IJ SOLN
40.0000 mg | Freq: Two times a day (BID) | INTRAMUSCULAR | Status: DC
Start: 2014-05-04 — End: 2014-05-05
  Administered 2014-05-04 – 2014-05-05 (×3): 40 mg via INTRAVENOUS
  Filled 2014-05-03 (×4): qty 4

## 2014-05-03 MED ORDER — SODIUM CHLORIDE 0.9 % IJ SOLN
0.4000 mg | INTRAMUSCULAR | Status: DC | PRN
Start: 2014-05-03 — End: 2014-05-05

## 2014-05-03 MED ORDER — SODIUM CHLORIDE 0.9 % IJ SOLN
3.0000 mL | Freq: Three times a day (TID) | INTRAMUSCULAR | Status: DC
Start: 2014-05-03 — End: 2014-05-05
  Administered 2014-05-04 – 2014-05-05 (×5): 3 mL via INTRAVENOUS

## 2014-05-03 MED ORDER — FUROSEMIDE 10 MG/ML IJ SOLN
40.0000 mg | Freq: Once | INTRAMUSCULAR | Status: AC
Start: 2014-05-03 — End: 2014-05-03
  Administered 2014-05-03: 40 mg via INTRAVENOUS

## 2014-05-03 MED ORDER — PANTOPRAZOLE SODIUM 40 MG PO TBEC
40.0000 mg | DELAYED_RELEASE_TABLET | Freq: Every day | ORAL | Status: DC
Start: 2014-05-04 — End: 2014-05-05
  Administered 2014-05-04 – 2014-05-05 (×2): 40 mg via ORAL
  Filled 2014-05-03 (×2): qty 1

## 2014-05-03 MED ORDER — HYDRALAZINE HCL 20 MG/ML IJ SOLN
10.0000 mg | Freq: Four times a day (QID) | INTRAMUSCULAR | Status: DC | PRN
Start: 2014-05-03 — End: 2014-05-05

## 2014-05-03 MED ORDER — WARFARIN SODIUM 2.5 MG PO TABS
2.5000 mg | ORAL_TABLET | Freq: Every day | ORAL | Status: DC
Start: 2014-05-04 — End: 2014-05-04
  Filled 2014-05-03: qty 1

## 2014-05-03 MED ORDER — LEVOTHYROXINE SODIUM 88 MCG PO TABS
88.0000 ug | ORAL_TABLET | Freq: Every day | ORAL | Status: DC
Start: 2014-05-04 — End: 2014-05-05
  Administered 2014-05-04 – 2014-05-05 (×2): 88 ug via ORAL
  Filled 2014-05-03 (×3): qty 1

## 2014-05-03 NOTE — Plan of Care (Signed)
Problem: Safety  Goal: Patient will be free from injury during hospitalization  Outcome: Progressing  Notify staff for any needs. Use call bell for assistance.    Problem: Pain  Goal: Patient's pain/discomfort is manageable  Outcome: Progressing  Notify staff if experiencing any pain and/or shortness of breath.

## 2014-05-03 NOTE — ED Notes (Signed)
Lasix given over several minutes, admitting md at bedside now

## 2014-05-03 NOTE — ED Notes (Signed)
Pt states she felt a heaviness midsternal, intermittently. Ate breakfast and wasn't feeling well, laid down and when she got up she started having shortness of breath with continued chest wall discomfort.

## 2014-05-03 NOTE — H&P (Signed)
HISTORY AND PHYSICAL - [facility]    Date Time: 05/03/2014 7:26 PM  Patient Name: Barbara [name],Barbara [name]  Attending Physician: Clovis Riley, MD  Primary Care Physician: Piedad Climes, MD    CC:   Chief Complaint   Patient presents with   . Chest Pain       Assessment:                                                                                       [facility]   Principal Problem:    Acute exacerbation of CHF (congestive heart failure)  Active Problems:    Chest pain    Hypertension    Hypothyroid    Chronic anticoagulation    Esophagitis, unspecified      Plan:                                                                                                   [facility]   Acute exacerbation of CHF (congestive heart failure) likely due to medication noncompliance, she stopped taking her Lasix for 1 week. She stopped taking because of frequent urination and pain when mobilization. The plan  Admit to medical floor  Continue IV diuresis with Lasix  Monitor I's and O  Telemetry monitoring  Chest pain resolved after pain medication   Continue nitroglycerin when necessary   Monitor serial cardiac enzymes   Hypertension on the high side   Continue home medication with holding parameters   Hydralazine IV when necessary  Hypothyroid   Continue Synthroid  Chronic anticoagulation   Monitor INR daily   Continue warfarin home dose  Esophagitis, unspecified   Continue Protonix home dose   Consider GI consultation if the epigastric pain worsened  DVT prophylaxis on warfarin by mouth    Service status: Inpatient: risk of morbidity and mortality and risk of progressive disease  Anticipate discharge in:2 days.  History of Presenting Illness:                                                          [name] Barbara [name] is a 80 y.o. female was significant past medical history of pulmonary embolism and hypertension who presents to the hospital with plans of chest pain and shortness of breath  of one day duration. She was seen in her usual state health when she noticed the epigastric substernal chest pain, pricking in nature but no radiation. She also noticed chest tightness and feeling of congestion associated with  nonproductive cough. She had no fever nor  chills. She admits not taking her Lasix because of pain in her lower leg while using bath room. She also complains of pain and swelling both gave history of esophageal narrowing status post amputation. Denies any vomiting or excessive salivation. She had no urinary urgency frequency nor dysuria. Currently she is chest pain-free at the time of my examination after she received IV Lasix.  Past Medical History:                                                                        [facility]     Past Medical History   Diagnosis Date   . HTN (hypertension)    . Pulmonary embolism    . PVD (peripheral vascular disease)    . Disorder of thyroid        Past Surgical History:                                                                       [facility]     Past Surgical History   Procedure Laterality Date   . Left arm     . Hysterectomy         Family History:                                                                                    [facility]     History reviewed. No pertinent family history.    Social History:                                                                                    [facility]     History     Social History   . Marital Status: Divorced     Spouse Name: N/A     Number of Children: N/A   . Years of Education: N/A     Occupational History   . Not on file.     Social History Main Topics   . Smoking status: Never Smoker    . Smokeless tobacco: Never Used   . Alcohol Use: No   . Drug Use: No   . Sexual Activity: Not on file     Other Topics Concern   . Not on file     Social History Narrative       Code Status:  No Order  Allergies:                                                                                              [facility]     Allergies   Allergen Reactions   . Codeine    . Erythromycin    . Iodine    . Penicillins    . Sulfa Antibiotics    . Tramadol    . Zithromax [Azithromycin]        Medications:                                                                                       [facility]     No current facility-administered medications on file prior to encounter.     Current Outpatient Prescriptions on File Prior to Encounter   Medication Sig Dispense Refill   . gabapentin (NEURONTIN) 300 MG capsule Take 1 capsule (300 mg total) by mouth 2 (two) times daily. 60 capsule 0   . levothyroxine (SYNTHROID, LEVOTHROID) 75 MCG tablet Take 88 mcg by mouth Once a day at 6:00am.        . losartan (COZAAR) 100 MG tablet Take 100 mg by mouth daily.     . metoprolol (LOPRESSOR) 50 MG tablet Take 50 mg by mouth 2 (two) times daily.     . pantoprazole (PROTONIX) 40 MG tablet Take 40 mg by mouth daily.     [name] Kitchen warfarin (COUMADIN) 2.5 MG tablet Take 2.5 mg by mouth daily.     . [DISCONTINUED] lidocaine (LIDODERM) 5 % Place 1 patch onto the skin daily. Remove & Discard patch within 12 hours or as directed by MD 30 patch 0       Review Of Systems:                                                                          [facility]     1. Constitutional:  Denies fever, weight loss or fatigue.    2. Eyes:  Denies blurry vision or double vision.    3. ENT:  Denies sore throat or trouble swallowing.    4. CV:  See history of present illness    5. Respiratory:  See history of present illness.    6. GI:  See history of present illness.    7. GU: Denies pain or burning with urination.    8. MS: Denies new muscle or joint pain.    9. Skin: Denies rash.  10. Neuro: Denies any new areas of numbness or weakness.    11. Psych: Denies being depressed or anxious.    12. Heme: Denies any bleeding or recent anemia.    13. Endocrine: Denies heat or cold intolerance. Denies excessive thirst or  urination.    14. Immune:  Denies seasonal allergies or asthma exacerbation.    Physical Exam:                                                                                   [facility]     Patient Vitals for the past 24 hrs:   BP Temp Pulse Resp SpO2 Height Weight   05/03/14 1900 - 98.1 F (36.7 C) - - - - -   05/03/14 1853 194/88 mmHg - 74 22 100 % - -   05/03/14 1830 - - 79 17 100 % - -   05/03/14 1722 - - 79 18 100 % - -   05/03/14 1702 (!) 187/95 mmHg - 71 18 100 % - -   05/03/14 1632 (!) 196/167 mmHg - 66 22 100 % - -   05/03/14 1617 195/59 mmHg - 66 (!) 24 100 % - -   05/03/14 1616 195/59 mmHg - 70 (!) 23 100 % 1.6 m (5\' 3" ) 106.595 kg (235 lb)     Body mass index is 41.64 kg/(m^2).    Intake/Output Summary (Last 24 hours) at 05/03/14 1926  Last data filed at 05/03/14 1925   Gross per 24 hour   Intake      0 ml   Output    425 ml   Net   -425 ml       General: awake, alert, oriented x 3; no acute distress.  HEENT: perrla, eomi, sclera anicteric  oropharynx clear without lesions, mucous membranes moist  Neck: supple, no lymphadenopathy, no thyromegaly, no JVD, no carotid bruits  Cardiovascular: regular rate and rhythm, no murmurs, rubs or gallops  Lungs: Decreased air entry bilaterally, without wheezing, rhonchi, or rales  Abdomen: soft, epigastric area tenderness, non-distended; no palpable masses, no hepatosplenomegaly, normoactive bowel sounds, no rebound or guarding  Extremities: Bilateral lower leg swelling nonpitting no clubbing, cyanosis,  Neuro: cranial nerves grossly intact, strength 5/5 in upper and lower extremities, sensation intact,   Skin: no rashes or lesions noted  Labs and Imaging:                                                                              [facility]     Results     Procedure Component Value Units Date/Time    BNP [[id number]]  (Abnormal) Collected:  05/03/14 1620    Specimen Information:  Blood / Blood Updated:  05/03/14 1904     B-Natriuretic Peptide  227.4 (H) pg/mL     Creatine Kinase (CK) [[id number]] Collected:  05/03/14 1620  Specimen Information:  Blood / Plasma Updated:  05/03/14 1656     Creatine Kinase (CK) 60 U/L     i-Stat Troponin [[id number]] Collected:  05/03/14 1643    Specimen Information:  Blood Updated:  05/03/14 1655     Trop I, ISTAT <0.02 ng/mL     I-Stat INR [[id number]] Collected:  05/03/14 1644    Specimen Information:  Blood Updated:  05/03/14 1648     i-STAT INR 2.1     i-Stat Chem 8 CartrIDge [[id number]] Collected:  05/03/14 1645    Specimen Information:  Blood Updated:  05/03/14 1648     i-STAT Sodium 139 mMol/L      i-STAT Potassium 4.2 mMol/L      i-STAT Chloride 102 mMol/L      TCO2, ISTAT 29 mMol/L      Ionized Ca, ISTAT 4.90 mg/dL      i-STAT Glucose 98 mg/dL      i-STAT Creatinine 1.10 mg/dL      i-STAT BUN 9 mg/dL      Anion Gap, ISTAT 08.6      EGFR 47 mL/min/1.18m2      i-STAT Hematocrit 44.0 %      i-STAT Hemoglobin 15.0 gm/dL     I-Stat INR [[id number]] Collected:  05/03/14 1640    Specimen Information:  ISTAT Updated:  05/03/14 1643     I-STAT Notification Istat Notification     I-Stat Chem 8 [[id number]] Collected:  05/03/14 1620    Specimen Information:  ISTAT Updated:  05/03/14 1643     I-STAT Notification Istat Notification     I-Stat Troponin [[id number]] Collected:  05/03/14 1620    Specimen Information:  ISTAT Updated:  05/03/14 1643     I-STAT Notification Istat Notification     CBC and differential [[id number]] Collected:  05/03/14 1620    Specimen Information:  Blood / Blood Updated:  05/03/14 1639     WBC 7.3 K/cmm      RBC 4.94 M/cmm      Hemoglobin 15.0 gm/dL      Hematocrit 59.5 %      MCV 92 fL      MCH 30 pg      MCHC 33 gm/dL      RDW 63.8 %      PLT CT 205 K/cmm      MPV 8.2 fL      NEUTROPHIL % 60.3 %      Lymphocytes 28.2 %      Monocytes 8.7 %      Eosinophils % 2.1 %      Basophils % 0.8 %      Neutrophils Absolute 4.4 K/cmm      Lymphocytes Absolute 2.1 K/cmm      Monocytes Absolute 0.6 K/cmm       Eosinophils Absolute 0.1 K/cmm      BASO Absolute 0.1 K/cmm           XR Chest 2 Views   Final Result   Clinical History:   Shortness of breath      Examination:   Frontal and lateral views of the chest.      Comparison:   07 August 2011      Findings:   Heart size is stable. It is probably enlarged. Heavy calcification is    present in the aorta. The interstitium is   thickened and there is faint perihilar airspace disease. The lungs are    otherwise grossly clear. No acute skeletal  abnormalities are seen. Severe degenerative changes are present in both    shoulders.      IMPRESSION:    Findings most suggestive of congestive heart failure.      ReadingStation:[id number]            Signed by: Cala Bradford, MD    [facility]  [address]  [place], [place] [address]  Pager [phone]  IE:[email or web address], Judith Blonder, MD

## 2014-05-03 NOTE — ED Provider Notes (Signed)
[facility]  EMERGENCY DEPARTMENT  History and Physical Exam       Patient Name: ANGELIKA, JERRETT  Encounter Date:  05/03/2014  Attending Physician: Misty Stanley A. Nunzio Cory, MD  PCP: Piedad Climes, MD  Patient DOB:  09/03/1928  MRN:  [id number]  Room:  C16/C16-A      History of Presenting Illness     Chief complaint: Chest Pain      HPI/ROS is limited by: none  HPI/ROS given by: Patient and family member    Sania Noy is a 80 y.o. female who presents with chest pain onset at rest today.  Pain described as a heaviness, middle of her chest.  Pt was not feeling well prior to onset of chest pain, laid down and tried to rest.  Sob and diaphoresis associated with chest pain.  Called her son, who stated patient was so short of breath he could barely understand her on the telephone so he called called EMS.  She has had increased leg swelling over the last week but has not been taking her "fluid pill" because it makes her have to use the bathroom too often.      Also c/o food getting stuck in her throat.  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  States that it was years ago.       Review of Systems     Review of Systems   Constitutional: Positive for diaphoresis. Negative for fever and chills.   HENT: Negative for congestion, ear pain and sore throat.    Eyes: Negative for visual disturbance.   Respiratory: Positive for shortness of breath. Negative for cough.    Cardiovascular: Positive for chest pain. Negative for palpitations.   Gastrointestinal: Negative for nausea, vomiting, abdominal pain and diarrhea.   Genitourinary: Negative for difficulty urinating.   Musculoskeletal: Negative for back pain and arthralgias.   Skin: Negative for rash.   Neurological: Negative for weakness, numbness and headaches.   Hematological: Negative for adenopathy. Does not bruise/bleed easily.   All other systems reviewed and are negative.       Allergies & Medications     Pt is  allergic to codeine; erythromycin; iodine; penicillins; sulfa antibiotics; tramadol; and zithromax.    Home Meds: EMR link not correct; nurses' notes reviewed for meds and dosages     Past Medical History     Pt has a past medical history of HTN (hypertension); Pulmonary embolism; PVD (peripheral vascular disease); and Disorder of thyroid.     Past Surgical History     Pt  has past surgical history that includes left arm and Hysterectomy.     Family History     The family history is not on file.     Social History     Pt reports that she has never smoked. She has never used smokeless tobacco. She reports that she does not drink alcohol or use illicit drugs.     Physical Exam     Blood pressure 194/88, pulse 74, resp. rate 22, height 1.6 m, weight 106.595 kg, SpO2 100 %.    Physical Exam   Constitutional: She is oriented to person, place, and time. She appears well-developed and well-nourished. No distress.   HENT:   Head: Normocephalic and atraumatic.   Right Ear: External ear normal.   Left Ear: External ear normal.   Nose: Nose normal.   Mouth/Throat: Oropharynx is clear and moist.  Eyes: Conjunctivae and EOM are normal. Pupils are equal, round, and reactive to light.   Neck: Normal range of motion. Neck supple.   Cardiovascular: Normal rate, regular rhythm, normal heart sounds and intact distal pulses.  Exam reveals no gallop and no friction rub.    No murmur heard.  Pulmonary/Chest: Effort normal. No respiratory distress. She has decreased breath sounds in the right lower field and the left lower field. She has no wheezes. She has no rales.   Abdominal: Soft. Bowel sounds are normal. She exhibits no distension. There is no tenderness. There is no rebound and no guarding.   Musculoskeletal: Normal range of motion. She exhibits edema. She exhibits no tenderness.   Edema left lower extremity   Lymphadenopathy:     She has no cervical adenopathy.   Neurological: She is alert and oriented to person, place, and time.    Skin: Skin is warm and dry. No rash noted.   Psychiatric: She has a normal mood and affect.   Nursing note and vitals reviewed.       Diagnostic Results     The results of the diagnostic studies below have been reviewed by myself:    Labs  Labs Reviewed   VH I-STAT CHEM 8 NOTIFICATION   VH I-STAT TROPONIN NOTIFICATION   VH I-STAT INR NOTIFICATION   CBC AND DIFFERENTIAL   CK   VH I-STAT INR   B-TYPE NATRIURETIC PEPTIDE   I-STAT CHEM 8 CARTRIDGE   I-STAT TROPONIN       Radiologic Studies  Xr Chest 2 Views    05/03/2014   Findings most suggestive of congestive heart failure.  ReadingStation:[id number]      EKG: Sinus rhythm; negative for ischemic changes.     ED Course & Treatment     Previous records reviewed.  D/dx, workup, anticipated clinical course discussed with patient/family.  Given patient's presentation, concern for CAD over esophageal stricture and need for further evaluation is warranted. Initially, unclear if patient had a history of CHF. Patient initially denied this, no indication of a diuretic on her medicine list.  After further discussion, patient states she did not include that medication because she is no longer taking it due to side effects. Patient vacillated several times whether or not she would or would not be admitted. Finally convinced to stay by her son. Agrees to admission. Results reviewed with patient/family.  Patient appreciative of care.  All questions answered.  Patient/family comfortable with treatment plan.  Case dicussed with admitting MD.  Dr. Theodoro Grist will see the patient in the emergency department and evaluate for admission. Lasix IV administered.       Medical Decision Making     The differential diagnosis includes, but is not limited to acute coronary syndrome, unstable angina, pericarditis, aortic dissection, pulmonary embolism, CHF, atrial fibrillation/ flutter, ventricular dysrhythmia, chest wall pain, costochondritis, myofascial strain, viral syndrome, pneumonia, bronchitis,  pleurisy, biliary disorder, GERD, abnormal EKG    This chart was generated by an EMR and may contain errors or omissions not intended by the user.     Procedures / Critical Care     None     Diagnosis / Disposition     Clinical Impression  1. Chest pain, unspecified chest pain type    2. Acute pulmonary edema    3. Dysphagia        Disposition  ED Disposition     Admit Bed Type: Telemetry [5]  Admitting Physician: Cala Bradford [[id number]]  Patient Class: Inpatient [101]            The documentation recorded by my scribe, Zane Herald, accurately reflects the services I personally performed and the decisions made by me.  Moxie Kalil A. Nunzio Cory, MD         Clovis Riley, MD  05/08/14 [phone]

## 2014-05-03 NOTE — ED Notes (Signed)
sbar report called to Shanda Bumps, family notified per request. Pt aware

## 2014-05-04 DIAGNOSIS — N183 Chronic kidney disease, stage 3 unspecified: Secondary | ICD-10-CM | POA: Diagnosis present

## 2014-05-04 DIAGNOSIS — Z86711 Personal history of pulmonary embolism: Secondary | ICD-10-CM | POA: Diagnosis present

## 2014-05-04 DIAGNOSIS — I2699 Other pulmonary embolism without acute cor pulmonale: Secondary | ICD-10-CM | POA: Diagnosis present

## 2014-05-04 DIAGNOSIS — I4819 Other persistent atrial fibrillation: Secondary | ICD-10-CM | POA: Diagnosis present

## 2014-05-04 LAB — BASIC METABOLIC PANEL
Anion Gap: 15 mMol/L (ref 7.0–18.0)
BUN / Creatinine Ratio: 10.1 Ratio (ref 10.0–30.0)
BUN: 11 mg/dL (ref 7–22)
CO2: 29 mMol/L (ref 20.0–30.0)
Calcium: 9.7 mg/dL (ref 8.5–10.5)
Chloride: 101 mMol/L (ref 98–110)
Creatinine: 1.09 mg/dL (ref 0.60–1.20)
EGFR: 48 mL/min/{1.73_m2}
Glucose: 95 mg/dL (ref 70–99)
Osmolality Calc: 280 mOsm/kg (ref 275–300)
Potassium: 4 mMol/L (ref 3.5–5.3)
Sodium: 141 mMol/L (ref 136–147)

## 2014-05-04 LAB — CBC
Hematocrit: 44.5 % (ref 36.0–48.0)
Hematocrit: 49.2 % — ABNORMAL HIGH (ref 36.0–48.0)
Hemoglobin: 14.9 gm/dL (ref 12.0–16.0)
Hemoglobin: 16.4 gm/dL — ABNORMAL HIGH (ref 12.0–16.0)
MCH: 31 pg (ref 28–35)
MCH: 31 pg (ref 28–35)
MCHC: 34 gm/dL (ref 32–36)
MCHC: 33 gm/dL (ref 32–36)
MCV: 92 fL (ref 80–100)
MCV: 92 fL (ref 80–100)
MPV: 8.6 fL (ref 6.0–10.0)
MPV: 9 fL (ref 6.0–10.0)
PLT CT: 206 10*3/uL (ref 130–440)
PLT CT: 205 10*3/uL (ref 130–440)
RBC: 4.81 10*6/uL (ref 3.80–5.00)
RBC: 5.33 10*6/uL — ABNORMAL HIGH (ref 3.80–5.00)
RDW: 14 % (ref 11.0–14.0)
RDW: 14.1 % — ABNORMAL HIGH (ref 11.0–14.0)
WBC: 8 10*3/uL (ref 4.0–11.0)
WBC: 7.9 10*3/uL (ref 4.0–11.0)

## 2014-05-04 LAB — CREATININE, SERUM
Creatinine: 1.27 mg/dL — ABNORMAL HIGH (ref 0.60–1.20)
EGFR: 40 mL/min/{1.73_m2}

## 2014-05-04 LAB — PT/INR
PT INR: 1.6 — ABNORMAL HIGH (ref 0.5–1.3)
PT: 17.2 s — ABNORMAL HIGH (ref 9.5–11.5)

## 2014-05-04 LAB — TSH: TSH: 3.2 u[IU]/mL (ref 0.40–4.20)

## 2014-05-04 MED ORDER — WARFARIN SODIUM 2.5 MG PO TABS
2.5000 mg | ORAL_TABLET | ORAL | Status: DC
Start: 2014-05-04 — End: 2014-05-05
  Administered 2014-05-04: 2.5 mg via ORAL
  Filled 2014-05-04: qty 1

## 2014-05-04 NOTE — Progress Notes (Signed)
MEDICINE PROGRESS NOTE    Date Time: 05/04/2014 9:17 AM  Patient Name: Barbara [name]  Attending Physician: Piedad Climes, MD      Subjective     CC: Acute exacerbation of CHF (congestive heart failure)    Interval History/24 hour events: Has had a good diuresis with 2 kg fluid loss, has less pain with improved dyspnea.  Did not rest well, mattress aggravates her chronic leg pain.  Troponin not elevated.    HPI/Subjective: I have followed this patient for several years with a variety of problems including a history of DVT with pulmonary emboli, subsequent pulmonary hypertension, atrial fibrillation, chronic leg pain felt to be neuropathic, and general poor health. She has been followed by a pulmonologist and the most recent echocardiogram was 16 months ago revealing a normal EF but moderate pulmonary hypertension. She is anticoagulated for atrial fibrillation, and is supposed to take furosemide on a daily basis. One week ago she decided to stop the medication since she has discomfort going to the bathroom because of leg pain. Yesterday she developed the fairly sudden onset of chest discomfort and dyspnea, she was admitted with a chest x-ray suggestive of heart failure and improved with a diuretic and the use of oxygen.    As noted above she has had long-standing problems with her legs and had previously been diagnosed with peripheral neuropathy by neurology. She has been taking medication with only partial benefit. She was referred back to neurology and was seen 3 days ago, I have not received the record yet but the patient tells me she is scheduled for additional studies including NCV and possibly an MRI of the lumbar spine for spinal stenosis.    She has a history of GERD and previously required dilatation for dysphagia. Some of her discomfort yesterday may have been related to heartburn but today is improved and she is taking the PPI. She has hypothyroidism and a thyroid nodule, the current TSH is pending.      Review of Systems:     Good appetite, no change in bowel function.  Voiding well without problems.    Physical Exam:     VITAL SIGNS PHYSICAL EXAM   Temp:  [97.7 F (36.5 C)-98.1 F (36.7 C)] 97.8 F (36.6 C)  Heart Rate:  [58-86] 58  Resp Rate:  [17-24] 20  BP: (130-196)/(59-167) 139/74 mmHg  Blood Glucose:    Telemetry: A fib with occasional rapid rate in 140s      Intake/Output Summary (Last 24 hours) at 05/04/14 0917  Last data filed at 05/04/14 0551   Gross per 24 hour   Intake    315 ml   Output   1025 ml   Net   -710 ml    Physical Exam  General: awake, alert X 3  Cardiovascular: Irreg, soft murmur at apex  Lungs: Few basilar crackles   Abdomen: Soft, nontender  Extremities: Trace edema  Other: Marked tenderness of legs        Meds:     Medications were reviewed: [x]     Labs:     Labs (last 72 hours):      Recent Labs  Lab 05/04/14  0637 05/03/14  1620   WBC 8.0 7.3   HEMOGLOBIN 14.9 15.0   HEMATOCRIT 44.5 45.4   PLATELETS 206 205       No results for input(s): PT, INR, PTT in the last 168 hours.   Recent Labs  Lab 05/04/14  [phone]  SODIUM 141   POTASSIUM 4.0   CHLORIDE 101   CO2 29.0   BUN 11   CREATININE 1.09   CALCIUM 9.7   GLUCOSE 95                 Imagining reports reviewed  Radiology Results (24 Hour)     Procedure Component Value Units Date/Time    XR Chest 2 Views [[id number]] Collected:  05/03/14 1825    Order Status:  Completed Updated:  05/03/14 1827    Narrative:      Clinical History:  Shortness of breath    Examination:  Frontal and lateral views of the chest.    Comparison:  07 August 2011    Findings:  Heart size is stable. It is probably enlarged. Heavy calcification is present in the aorta. The interstitium is  thickened and there is faint perihilar airspace disease. The lungs are otherwise grossly clear. No acute skeletal  abnormalities are seen. Severe degenerative changes are present in both shoulders.      Impression:      Findings most suggestive of congestive heart  failure.    ReadingStation:[id number]            Microbiology, reviewed and are significant for:  Assessment:   Principal Problem:    Acute exacerbation of CHF (congestive heart failure)  Active Problems:    Esophagitis, unspecified    Persistent atrial fibrillation    Thromboembolic pulmonary hypertension    History of pulmonary embolism    Chest pain    Hypertension    Hypothyroid    Chronic anticoagulation    CHF (congestive heart failure)  Resolved Problems:    * No resolved hospital problems. *          Plan:   See orders  no changes to medications indicated, new lab test(s) ordered, new imaging test ordered and new nursing orders    Continue diuresis  Clarify warfarin schedule  Echocardiogram  Possible [place] tomorrow if stable          Signed by: Piedad Climes, MD  Page [phone]

## 2014-05-04 NOTE — Progress Notes (Addendum)
[facility]  [address]  [place] [address]    INITIAL ASSESSMENT  Case Management / Social Work      Estimated D/C Date: 8/28      PCP/Last visit:  Dr. Florian Buff      Home assessment/PLOF:  Resides at home alone, independent with care, family provides transportation      Financials and Insurance:  Medicare/[employer]       Community Services:  None       DME's/Supplier:  None      Inpatient Plan of Care: Patient admitted to hospital with exacerbation of CHF. Patient did not take diuretic at home because it made her get up too much causing increased pain in her legs. Plan diuresis, clarify coumadin schedule, echo.      CM Interventions: CM met with patient who admits to not taking her lasix as prescribed. Discussed consequences of noncompliance with patient-she verbalized understanding. Discussed HH but she is not interested ?could benefit from CHF-Will need MD to order.      Transportation: Children      Barriers to discharge:  None      D/C Plan and Needs:  Home      Monna Fam, RN, BSN  Case Manager  [facility]  Ph [phone]  Fx [phone]

## 2014-05-04 NOTE — Plan of Care (Signed)
Problem: Pain  Goal: Patient's pain/discomfort is manageable  Outcome: Progressing

## 2014-05-04 NOTE — Plan of Care (Signed)
Problem: Safety  Goal: Patient will be free from injury during hospitalization  Outcome: Progressing

## 2014-05-04 NOTE — Progress Notes (Signed)
Patient arrived to room 552 with belongings. No evidence of distress. Patient stated that she did not remember the specific days she takes her coumadin but knows that she takes it four days a week rather than daily. Patient did state that she remembers taking it yesterday.  Will write a sticky note to MD and notify day shift.  Notified Doctor Cho about patient having no diet order. Verbal orders received to start patient on a Regular Diet 1800 fluid restriction. Call bell within reach, bed alarm on, and bed placed in the lowest position. Will continue to monitor throughout shift.

## 2014-05-04 NOTE — UM Notes (Signed)
Hx: PE, HTN, PVD, Thyroid disorder    C/O chest pain and shortness of breath x 1 day.  Developed epigastric substernal chest pain and chest tightness, and feeling of congestion associated with nonproductive cough.    To ED: BP 195/59, P70, R23, T36.7, sat 100% O2 2L  Completed: CXR  Given: IV Lasix 40mg   Labs: BNP=227.4  CXR-suggestive of congestive heart failure     Admit 05/03/14 Inpatient  Dx: Acute CHF Exacerbation  Vital signs q4hr  Decreased air entry bilaterally   1800 fluid restriction  Telemetry  IV Lasix 40mg  bid  Lopressor 50mg  q12hr  Cozaar 100mg  qdayCoumadin qday  Echo      Diannia Ruder RN  Utilization Management  Care Management Dept    [facility]  [address]  [place], [place] [address]  T [phone]    Fax [phone]   [email or web address]

## 2014-05-04 NOTE — Progress Notes (Signed)
Nutrition Note:    Pt screened for CHF Nutrition Therapy. Admitted with acute exacerbation of CHF.  Pt stopped taking her lasix for a week d/t frequent voiding. No issues eating, appetite good. No recent wt loss. Regular diet with FR ordered. ONS per protocol.    No education needs at this time    Talbert Cage, RD, CNSC  05/04/2014 8:51 AM

## 2014-05-04 NOTE — Progress Notes (Signed)
Uneventful shift. Patient appears to be resting at this time. Call bell within reach, bed alarm on, and bed placed in the lowest position.

## 2014-05-05 ENCOUNTER — Inpatient Hospital Stay: Payer: Medicare Other

## 2014-05-05 LAB — BASIC METABOLIC PANEL
Anion Gap: 16.7 mMol/L (ref 7.0–18.0)
BUN / Creatinine Ratio: 14.4 Ratio (ref 10.0–30.0)
BUN: 18 mg/dL (ref 7–22)
CO2: 28 mMol/L (ref 20.0–30.0)
Calcium: 9.9 mg/dL (ref 8.5–10.5)
Chloride: 97 mMol/L — ABNORMAL LOW (ref 98–110)
Creatinine: 1.25 mg/dL — ABNORMAL HIGH (ref 0.60–1.20)
EGFR: 41 mL/min/{1.73_m2}
Glucose: 98 mg/dL (ref 70–99)
Osmolality Calc: 278 mOsm/kg (ref 275–300)
Potassium: 3.7 mMol/L (ref 3.5–5.3)
Sodium: 138 mMol/L (ref 136–147)

## 2014-05-05 LAB — PT/INR
PT INR: 1.5 — ABNORMAL HIGH (ref 0.5–1.3)
PT: 16.3 s — ABNORMAL HIGH (ref 9.5–11.5)

## 2014-05-05 MED ORDER — WARFARIN SODIUM 2.5 MG PO TABS
2.5000 mg | ORAL_TABLET | Freq: Every day | ORAL | Status: DC
Start: 2014-05-05 — End: 2014-08-25

## 2014-05-05 MED ORDER — FUROSEMIDE 40 MG PO TABS
40.0000 mg | ORAL_TABLET | Freq: Every day | ORAL | Status: DC
Start: 2014-05-05 — End: 2015-04-03

## 2014-05-05 MED ORDER — GABAPENTIN 300 MG PO CAPS
300.0000 mg | ORAL_CAPSULE | Freq: Three times a day (TID) | ORAL | Status: AC
Start: 2014-05-05 — End: ?

## 2014-05-05 NOTE — Progress Notes (Signed)
Patient taken down to echo at this time, assessment done before transport took pt see charting.

## 2014-05-05 NOTE — Progress Notes (Signed)
Assumed care at this time, received report from Ardelle Anton. Call bell within reach, patient appears to be asleep at this time, will assess when awake. CV lab called to get pt for echo.

## 2014-05-05 NOTE — Discharge Instructions (Signed)
Pulmonary Edema  Your healthcare provider has told you that you have pulmonary edema. Read on to learn more about pulmonary edema and how it can be treated.  What Is Pulmonary Edema?     Pulmonary edema is fluid in the air sacs (alveoli) in the lungs.   Pulmonary edema occurs when the air sacs (alveoli) in your lungs fill with fluid. The fluid buildup makes it hard for the lungs to do their job, including getting oxygen from the air you breathe. This can make it difficult to breathe. The most common cause of pulmonary edema is heart failure. When the heart doesn't work properly, it can cause pressure to rise in the veins (blood vessels) of the lungs. As pressure builds, fluid fills the alveoli. The extra fluid prevents oxygen from moving through the lungs properly. But heart failure isn't the only cause of pulmonary edema. Damage to the lungs or kidney failure can also cause fluid to fill the lungs. And in some cases, living or exercising at high altitudes can lead to fluid buildup in the lungs.  How Is Pulmonary Edema Diagnosed?  Your healthcare provider examines you and asks about your health history. You may also have one or more of the following:   Blood teststo take samples of blood.   Imaging teststo take detailed pictures of inside the body. These may include a chest x-ray and ultrasound.   Electrocardiography (ECG or EKG) to test how well the heart is functioning.  How Is Pulmonary Edema Treated?  Treatment usually depends on what's causing the edema. For instance, if it's due to heart failure, treating the heart condition will treat the edema. Treatment can also ease symptoms. Therapy often includes the following:   Oxygen.This may be given through a mask that goes over the nose. It may be given through a small tube that sits under the nose. Or it may be given through a tube that's placed into the windpipe (trachea). A ventilator--often called a breathing machine--may also be  used.   Medications.These may include diuretics ("water pills") to help relieve the body of extra fluid. The fluidpasses out of the body as urine. Medications to treat the heart may also be given. These can help improve how the heart functions, which helps reduce fluid buildup in the lungs.  What Are the Long-term Concerns?  If treated right away, pulmonary edema can be improved. It may even be cured. But, in some cases, ongoing treatment is needed to help control the problem. This mayrequire having procedures or taking medications for months or years. In some cases, long-term use of oxygen or breathing equipment is needed. This can lead to complications such as damage to lung tissue. Your healthcare provider can tell you more if needed.  Call the healthcare provider right away if you have any of the following:   Chest pain (call 911)   Severe trouble breathing (call 911)   Coughing up blood (call 911)   Skin turns blue (call 911)   Unusual or irregular heartbeat   Unable to speak full sentences before running out of breath   Sweating more than usual    2000-2014 [employer] [address], [place] [address]. All rights reserved. This information is not intended as a substitute for professional medical care. Always follow your healthcare professional's instructions.

## 2014-05-05 NOTE — Discharge Summary (Signed)
Discharge Summary    Date:05/05/2014   Patient Name: Barbara [name],Barbara [name]  Attending Physician: Piedad Climes, MD    Date of Admission:   05/03/2014    Date of Discharge:   05/05/2014      Admitting Diagnosis:   CHF    Discharge Dx:   Pulmonary hypertension    Principal Diagnosis: Acute exacerbation of CHF (congestive heart failure)  Active Hospital Problems    Diagnosis POA   . Persistent atrial fibrillation Yes   . Thromboembolic pulmonary hypertension Yes     Chronic   . Esophagitis, unspecified Yes   . History of pulmonary embolism Yes     Chronic   . Chronic kidney disease (CKD), stage III (moderate) Yes     Chronic   . Chest pain Yes   . Hypertension Yes   . Hypothyroid Yes   . Chronic anticoagulation Not Applicable   . CHF (congestive heart failure) Yes      Resolved Hospital Problems    Diagnosis POA   . Principal Problem: Acute exacerbation of CHF (congestive heart failure) Yes         Treatment Team:   Treatment Team:   Attending Provider: Piedad Climes, MD     Procedures performed:   Radiology: all results from this admission  Xr Chest 2 Views    05/03/2014   Findings most suggestive of congestive heart failure.  ReadingStation:[id number]      Hospital Course:   Reason for admission / HPI: Admitted with dyspnea and pain after stopping diuretic.  See H&P and my first Progress Note for details.    Hospital Course: Following admission she was given IV diuretics and had a fairly rapid improvement in her breathing status, there was a 2.8 kg diuresis during the hospital stay. She had one burst of rapid atrial fibrillation but otherwise the rate was controlled. She was continued on warfarin for anticoagulation, the INR became subtherapeutic during the hospital stay. The oxygen was tapered and discontinued, she did not meet criteria for home oxygen. An echocardiogram was performed on the morning of discharge and the report was pending at the time she left the facility.    Her other medical problems remained stable  throughout the hospital stay and she was advised not to discontinue the diuretic again.    Patient condition at discharge: Improved  Today:  BP 133/64 mmHg  Pulse 64  Temp(Src) 97.9 F (36.6 C) (Oral)  Resp 16  Ht 1.6 m (5\' 3" )  Wt 97.251 kg (214 lb 6.4 oz)  BMI 37.99 kg/m2  SpO2 93%  Ranges for the last 24 hours:  Temp:  [97.8 F (36.6 C)-98.2 F (36.8 C)] 97.9 F (36.6 C)  Heart Rate:  [59-88] 64  Resp Rate:  [15-19] 16  BP: (106-133)/(64-81) 133/64 mmHg    Last set of labs     Recent Labs  Lab 05/04/14  1302   WBC 7.9   HEMOGLOBIN 16.4*   HEMATOCRIT 49.2*   PLATELETS 205       Recent Labs  Lab 05/05/14  0535   SODIUM 138   POTASSIUM 3.7   CHLORIDE 97*   CO2 28.0   BUN 18   CREATININE 1.25*   EGFR 41   GLUCOSE 98   CALCIUM 9.9       Recent Labs  Lab 05/05/14  0535   PT 16.3*   PT INR 1.5*       Recent Labs  Lab 05/03/14  2111  05/03/14  1620   CREATINE KINASE (CK)  --  60   TROPONIN I 0.01  --        Micro / Labs / Path pending:     Unresulted Labs     Procedure . . . Date/Time    Echocardiogram Adult Complete W Clr/ Dopp Waveform [[id number]] Resulted:  05/05/14 7062     Updated:  05/05/14 3762            Recommendations & Instructions for providers after discharge:  1. Monitor pulmonary status  2. Monitor Hct, INR, Chem 8    Discharge Instructions:           Follow-up Information     Follow up with Nolon Stalls, MD.    Specialties:  Gastroenterology, Internal Medicine    Why:  GI doctor to discuss esophageal dilitation    Contact information:    [address]  [address]  [place] [address]  [phone]          Follow up with Piedad Climes, MD In 1 week.    Specialty:  Internal Medicine    Contact information:    [address]  [place] [address]  [phone]            Discharge Diet: Cardiac Diet, 4 Gram Sodium (Salt) Restricted Diet        Activity/Weight bearing status: As tolerated  Disposition:  Home or Self Care     Discharge Medication List      Taking          furosemide 40 MG tablet    Dose:  40 mg   Commonly known as:  LASIX   Take 1 tablet (40 mg total) by mouth daily.       gabapentin 300 MG capsule   Dose:  300 mg   What changed:  when to take this   Commonly known as:  NEURONTIN   Take 1 capsule (300 mg total) by mouth 3 (three) times daily.       levothyroxine 88 MCG tablet   Commonly known as:  SYNTHROID, LEVOTHROID       losartan 100 MG tablet   Dose:  100 mg   Commonly known as:  COZAAR   Take 100 mg by mouth daily.       metoprolol 50 MG tablet   Dose:  50 mg   Commonly known as:  LOPRESSOR   Take 50 mg by mouth 2 (two) times daily.       pantoprazole 40 MG tablet   Dose:  40 mg   Commonly known as:  PROTONIX   Take 40 mg by mouth daily.       VITAMIN D PO   Dose:  1 tablet   Take 1 tablet by mouth 2 (two) times daily.       warfarin 2.5 MG tablet   Dose:  2.5 mg   What changed:    - when to take this  - additional instructions   Commonly known as:  COUMADIN   Take 1 tablet (2.5 mg total) by mouth daily. Take daily until FU with me           Minutes spent coordinating discharge and reviewing discharge plan: 20 minutes      Signed by: Piedad Climes, MD

## 2014-05-05 NOTE — Progress Notes (Signed)
Breathing easier, still using O2.  Has been ambulating in hall.  Has muscle cramps from diuresis    Will try to wean O2, if unsuccessful start O2 at home.    Echo performed this AM and pending. [place] later today if stable

## 2014-05-05 NOTE — Progress Notes (Signed)
Family called and stated that they woluld meet pt out front to pick her up for discharge. Reviewed instructions with pt. Stressed the importance of daily weight at home. Pt stated that she doesn't have a scale at home. Took pt down to main entrance in wheel chair and also told family that the pt needs to get a scale at home to weigh herself daily to assess for fluid weight gain.

## 2014-05-05 NOTE — Progress Notes (Signed)
[facility]   [address]   [place] [address]     PROGRESS NOTE  Case Management / Social Work       Estimated D/C Date: 8/28      Inpatient plan of care:      CM Interventions: Discharge home today. Does not qualify for home O2.      Barriers to discharge: None      D/C Plan and Needs: Home with outpatient follow up with PCP.      Monna Fam, RN, BSN  Case Manager  [facility]  Ph [phone]  Fx [phone]

## 2014-05-12 DIAGNOSIS — I2724 Chronic thromboembolic pulmonary hypertension: Secondary | ICD-10-CM | POA: Diagnosis present

## 2014-06-14 ENCOUNTER — Ambulatory Visit (HOSPITAL_BASED_OUTPATIENT_CLINIC_OR_DEPARTMENT_OTHER): Admit: 2014-06-14 | Payer: Self-pay | Admitting: Gastroenterology

## 2014-06-14 ENCOUNTER — Encounter (HOSPITAL_BASED_OUTPATIENT_CLINIC_OR_DEPARTMENT_OTHER): Payer: Self-pay

## 2014-06-14 SURGERY — DONT USE, USE 1095-ESOPHAGOGASTRODUODENOSCOPY (EGD), DIAGNOSTIC
Anesthesia: Conscious Sedation | Site: Abdomen

## 2014-08-25 ENCOUNTER — Observation Stay: Payer: Medicare Other | Admitting: Emergency Medicine

## 2014-08-25 ENCOUNTER — Observation Stay
Admission: EM | Admit: 2014-08-25 | Discharge: 2014-08-26 | Disposition: A | Discharge status: Home / Self Care | Payer: Medicare Other | Attending: Emergency Medicine | Admitting: Emergency Medicine

## 2014-08-25 ENCOUNTER — Emergency Department: Payer: Medicare Other

## 2014-08-25 DIAGNOSIS — R0789 Other chest pain: Secondary | ICD-10-CM

## 2014-08-25 DIAGNOSIS — Z87891 Personal history of nicotine dependence: Secondary | ICD-10-CM | POA: Insufficient documentation

## 2014-08-25 DIAGNOSIS — Z882 Allergy status to sulfonamides status: Secondary | ICD-10-CM | POA: Insufficient documentation

## 2014-08-25 DIAGNOSIS — I129 Hypertensive chronic kidney disease with stage 1 through stage 4 chronic kidney disease, or unspecified chronic kidney disease: Secondary | ICD-10-CM | POA: Insufficient documentation

## 2014-08-25 DIAGNOSIS — K219 Gastro-esophageal reflux disease without esophagitis: Secondary | ICD-10-CM

## 2014-08-25 DIAGNOSIS — Z885 Allergy status to narcotic agent status: Secondary | ICD-10-CM | POA: Insufficient documentation

## 2014-08-25 DIAGNOSIS — Z86711 Personal history of pulmonary embolism: Secondary | ICD-10-CM | POA: Insufficient documentation

## 2014-08-25 DIAGNOSIS — R072 Precordial pain: Principal | ICD-10-CM | POA: Insufficient documentation

## 2014-08-25 DIAGNOSIS — Z7901 Long term (current) use of anticoagulants: Secondary | ICD-10-CM | POA: Insufficient documentation

## 2014-08-25 DIAGNOSIS — I272 Other secondary pulmonary hypertension: Secondary | ICD-10-CM | POA: Insufficient documentation

## 2014-08-25 DIAGNOSIS — N183 Chronic kidney disease, stage 3 (moderate): Secondary | ICD-10-CM | POA: Insufficient documentation

## 2014-08-25 DIAGNOSIS — Z880 Allergy status to penicillin: Secondary | ICD-10-CM | POA: Insufficient documentation

## 2014-08-25 DIAGNOSIS — Z881 Allergy status to other antibiotic agents status: Secondary | ICD-10-CM | POA: Insufficient documentation

## 2014-08-25 DIAGNOSIS — Z91048 Other nonmedicinal substance allergy status: Secondary | ICD-10-CM | POA: Insufficient documentation

## 2014-08-25 DIAGNOSIS — I481 Persistent atrial fibrillation: Secondary | ICD-10-CM | POA: Insufficient documentation

## 2014-08-25 DIAGNOSIS — I739 Peripheral vascular disease, unspecified: Secondary | ICD-10-CM | POA: Insufficient documentation

## 2014-08-25 DIAGNOSIS — R079 Chest pain, unspecified: Secondary | ICD-10-CM | POA: Diagnosis present

## 2014-08-25 DIAGNOSIS — I10 Essential (primary) hypertension: Secondary | ICD-10-CM

## 2014-08-25 DIAGNOSIS — E039 Hypothyroidism, unspecified: Secondary | ICD-10-CM | POA: Insufficient documentation

## 2014-08-25 DIAGNOSIS — I509 Heart failure, unspecified: Secondary | ICD-10-CM | POA: Insufficient documentation

## 2014-08-25 DIAGNOSIS — K210 Gastro-esophageal reflux disease with esophagitis: Secondary | ICD-10-CM | POA: Insufficient documentation

## 2014-08-25 DIAGNOSIS — Z90710 Acquired absence of both cervix and uterus: Secondary | ICD-10-CM | POA: Insufficient documentation

## 2014-08-25 HISTORY — DX: Unspecified osteoarthritis, unspecified site: M19.90

## 2014-08-25 HISTORY — DX: Gastro-esophageal reflux disease without esophagitis: K21.9

## 2014-08-25 HISTORY — DX: Other seasonal allergic rhinitis: J30.2

## 2014-08-25 LAB — CBC AND DIFFERENTIAL
Basophils %: 0.2 % (ref 0.0–3.0)
Basophils Absolute: 0 10*3/uL (ref 0.0–0.3)
Eosinophils %: 1.5 % (ref 0.0–7.0)
Eosinophils Absolute: 0.1 10*3/uL (ref 0.0–0.8)
Hematocrit: 46.3 % (ref 36.0–48.0)
Hemoglobin: 15 gm/dL (ref 12.0–16.0)
Lymphocytes Absolute: 1.7 10*3/uL (ref 0.6–5.1)
Lymphocytes: 20.7 % (ref 15.0–46.0)
MCH: 31 pg (ref 28–35)
MCHC: 32 gm/dL (ref 32–36)
MCV: 97 fL (ref 80–100)
MPV: 8.5 fL (ref 6.0–10.0)
Monocytes Absolute: 0.6 10*3/uL (ref 0.1–1.7)
Monocytes: 7.9 % (ref 3.0–15.0)
Neutrophils %: 69.7 % (ref 42.0–78.0)
Neutrophils Absolute: 5.6 10*3/uL (ref 1.7–8.6)
PLT CT: 203 10*3/uL (ref 130–440)
RBC: 4.8 10*6/uL (ref 3.80–5.00)
RDW: 12.3 % (ref 11.0–14.0)
WBC: 8 10*3/uL (ref 4.0–11.0)

## 2014-08-25 LAB — ECG 12-LEAD
Patient Age: 86 years
Q-T Dispersion: 48 ms
Q-T Interval(Corrected): 436 ms
Q-T Interval: 390 ms
QRS Axis: 22 deg
QRS Duration: 86 ms
T Axis: -15 deg
Ventricular Rate: 75 /min

## 2014-08-25 LAB — BASIC METABOLIC PANEL
Anion Gap: 16.5 mMol/L (ref 7.0–18.0)
BUN / Creatinine Ratio: 11.7 Ratio (ref 10.0–30.0)
BUN: 14 mg/dL (ref 7–22)
CO2: 25.7 mMol/L (ref 20.0–30.0)
Calcium: 10.1 mg/dL (ref 8.5–10.5)
Chloride: 102 mMol/L (ref 98–110)
Creatinine: 1.2 mg/dL (ref 0.60–1.20)
EGFR: 43 mL/min/{1.73_m2}
Glucose: 94 mg/dL (ref 70–99)
Osmolality Calc: 278 mOsm/kg (ref 275–300)
Potassium: 5.2 mMol/L (ref 3.5–5.3)
Sodium: 139 mMol/L (ref 136–147)

## 2014-08-25 LAB — B-TYPE NATRIURETIC PEPTIDE: B-Natriuretic Peptide: 237.2 pg/mL — ABNORMAL HIGH (ref 0.0–100.0)

## 2014-08-25 LAB — PT/INR
PT INR: 2 — ABNORMAL HIGH (ref 0.5–1.3)
PT INR: 1.8 — ABNORMAL HIGH (ref 0.5–1.3)
PT: 22.1 s — ABNORMAL HIGH (ref 9.5–11.5)
PT: 19.9 s — ABNORMAL HIGH (ref 9.5–11.5)

## 2014-08-25 LAB — I-STAT TROPONIN: Troponin I I-Stat: <0.02 ng/mL (ref 0.00–0.02)

## 2014-08-25 LAB — VH I-STAT TROPONIN NOTIFICATION

## 2014-08-25 MED ORDER — ACETAMINOPHEN 650 MG RE SUPP
650.00 mg | RECTAL | Status: DC | PRN
Start: 2014-08-25 — End: 2014-08-26

## 2014-08-25 MED ORDER — FUROSEMIDE 40 MG PO TABS
40.0000 mg | ORAL_TABLET | Freq: Every day | ORAL | Status: DC
Start: 2014-08-26 — End: 2014-08-26
  Filled 2014-08-25: qty 1

## 2014-08-25 MED ORDER — DEXAMETHASONE 4 MG PO TABS
4.00 mg | ORAL_TABLET | Freq: Every morning | ORAL | Status: DC
Start: 2014-08-26 — End: 2014-08-26
  Filled 2014-08-25: qty 1

## 2014-08-25 MED ORDER — LEVOTHYROXINE SODIUM 88 MCG PO TABS
88.0000 ug | ORAL_TABLET | Freq: Every day | ORAL | Status: DC
Start: 2014-08-26 — End: 2014-08-26
  Administered 2014-08-26: 88 ug via ORAL
  Filled 2014-08-25 (×2): qty 1

## 2014-08-25 MED ORDER — METOPROLOL TARTRATE 25 MG PO TABS
50.0000 mg | ORAL_TABLET | Freq: Two times a day (BID) | ORAL | Status: DC
Start: 2014-08-25 — End: 2014-08-26
  Administered 2014-08-25 – 2014-08-26 (×2): 50 mg via ORAL
  Filled 2014-08-25 (×2): qty 2

## 2014-08-25 MED ORDER — ACETAMINOPHEN 325 MG PO TABS
650.00 mg | ORAL_TABLET | ORAL | Status: DC | PRN
Start: 2014-08-25 — End: 2014-08-26

## 2014-08-25 MED ORDER — WARFARIN SODIUM 2 MG PO TABS
2.0000 mg | ORAL_TABLET | Freq: Every day | ORAL | Status: DC
Start: 2014-08-25 — End: 2014-08-26
  Administered 2014-08-25 (×2): 2 mg via ORAL
  Filled 2014-08-25 (×3): qty 1

## 2014-08-25 MED ORDER — DEXAMETHASONE 0.5 MG PO TABS
1.00 mg | ORAL_TABLET | Freq: Every morning | ORAL | Status: DC
Start: 2014-09-06 — End: 2014-08-26

## 2014-08-25 MED ORDER — LOSARTAN POTASSIUM 50 MG PO TABS
100.0000 mg | ORAL_TABLET | Freq: Every day | ORAL | Status: DC
Start: 2014-08-26 — End: 2014-08-26
  Administered 2014-08-26: 100 mg via ORAL
  Filled 2014-08-25: qty 2

## 2014-08-25 MED ORDER — DEXAMETHASONE SODIUM PHOSPHATE 10 MG/ML IJ SOLN
10.00 mg | Freq: Once | INTRAMUSCULAR | Status: AC
Start: 2014-08-25 — End: 2014-08-25
  Administered 2014-08-25: 10 mg via INTRAVENOUS
  Filled 2014-08-25: qty 1

## 2014-08-25 MED ORDER — GABAPENTIN 300 MG PO CAPS
300.0000 mg | ORAL_CAPSULE | Freq: Three times a day (TID) | ORAL | Status: DC
Start: 2014-08-25 — End: 2014-08-26
  Administered 2014-08-25: 300 mg via ORAL
  Filled 2014-08-25 (×5): qty 1

## 2014-08-25 MED ORDER — PANTOPRAZOLE SODIUM 40 MG PO TBEC
40.00 mg | DELAYED_RELEASE_TABLET | Freq: Every morning | ORAL | Status: DC
Start: 2014-08-26 — End: 2014-08-26
  Administered 2014-08-26: 40 mg via ORAL
  Filled 2014-08-25: qty 1

## 2014-08-25 MED ORDER — WARFARIN SODIUM 2 MG PO TABS
2.00 mg | ORAL_TABLET | Freq: Every day | ORAL | Status: DC
Start: 2014-08-26 — End: 2014-08-25

## 2014-08-25 MED ORDER — SODIUM CHLORIDE 0.9 % IJ SOLN
3.00 mL | Freq: Three times a day (TID) | INTRAMUSCULAR | Status: DC
Start: 2014-08-25 — End: 2014-08-26
  Administered 2014-08-25 – 2014-08-26 (×3): 3 mL via INTRAVENOUS

## 2014-08-25 MED ORDER — DEXAMETHASONE 4 MG PO TABS
2.00 mg | ORAL_TABLET | Freq: Every morning | ORAL | Status: DC
Start: 2014-08-30 — End: 2014-08-26

## 2014-08-25 NOTE — ED Notes (Signed)
Bed: C14-A  Expected date:   Expected time:   Means of arrival: Ambulance  Comments:  EMS

## 2014-08-25 NOTE — ED Provider Notes (Signed)
Physician/Midlevel provider first contact with patient: 08/25/14 1755         History     Chief Complaint   Patient presents with   . Chest Pain     HPI Comments: Pt presents to the ED C/O Chest pain.  Pt states that yesterday she noticed a tingling numbness sensation that continued today.  Pt today started to develop chest pressure sensation in substernal area with no radiation and continued numbness in arms.  Pt states she did not take any ASA because she is on coumadin and lasix.  Hx of PEs, PVD, CHF, pulmonary HT.  Pt denies SOB, does not use O2 at home.     Patient is a 80 y.o. female presenting with chest pain. The history is provided by the patient.   Chest Pain  Pain location:  Substernal area  Pain quality: pressure    Pain radiates to:  Does not radiate  Pain radiates to the back: no    Pain severity:  Mild  Onset quality:  Unable to specify  Duration:  1 day  Timing:  Constant  Progression:  Unchanged  Chronicity:  New  Relieved by:  Nothing  Ineffective treatments:  Rest  Associated symptoms: numbness (tingling in extremities)    Associated symptoms: no abdominal pain, no cough, no nausea, no palpitations, no shortness of breath and not vomiting             Past Medical History   Diagnosis Date   . HTN (hypertension)    . Pulmonary embolism    . PVD (peripheral vascular disease)    . Disorder of thyroid    . Seasonal allergic rhinitis    . Gastroesophageal reflux disease    . Arthritis        Past Surgical History   Procedure Laterality Date   . Left arm     . Hysterectomy     . Colonoscopy         History reviewed. No pertinent family history.    Social  History   Substance Use Topics   . Smoking status: Never Smoker    . Smokeless tobacco: Never Used   . Alcohol Use: No       .     Allergies   Allergen Reactions   . Codeine    . Erythromycin    . Iodine    . Penicillins    . Percocet [Oxycodone-Acetaminophen]    . Sulfa Antibiotics    . Tramadol    . Zithromax [Azithromycin]        Current Discharge  Medication List      CONTINUE these medications which have NOT CHANGED    Details   Cholecalciferol (VITAMIN D PO) Take 1 tablet by mouth 2 (two) times daily.      furosemide (LASIX) 40 MG tablet Take 1 tablet (40 mg total) by mouth daily.      gabapentin (NEURONTIN) 300 MG capsule Take 1 capsule (300 mg total) by mouth 3 (three) times daily.  Qty: 60 capsule, Refills: 0      levothyroxine (SYNTHROID, LEVOTHROID) 88 MCG tablet       losartan (COZAAR) 100 MG tablet Take 100 mg by mouth daily.      metoprolol (LOPRESSOR) 50 MG tablet Take 50 mg by mouth 2 (two) times daily.      pantoprazole (PROTONIX) 40 MG tablet Take 40 mg by mouth daily.      !! warfarin (COUMADIN) 1 MG tablet  Take 1 mg by mouth daily.      !! warfarin (COUMADIN) 2 MG tablet Take 2 mg by mouth daily.       !! - Potential duplicate medications found. Please discuss with provider.           Review of Systems   Constitutional: Negative for unexpected weight change.   HENT: Negative.    Respiratory: Negative for cough and shortness of breath.    Cardiovascular: Positive for chest pain. Negative for palpitations and leg swelling.   Gastrointestinal: Negative for nausea, vomiting, abdominal pain and diarrhea.   Genitourinary: Negative.    Musculoskeletal: Negative.    Neurological: Positive for numbness (tingling in extremities).       Physical Exam    BP: (!) 169/95 mmHg, Heart Rate: 90, Temp: 98.2 F (36.8 C), Resp Rate: (!) 31, SpO2: 100 %, Weight: 97.977 kg    Physical Exam   Constitutional: She is oriented to person, place, and time. She appears well-developed and well-nourished. No distress.   HENT:   Head: Normocephalic and atraumatic.   Mouth/Throat: Oropharynx is clear and moist.   Eyes: EOM are normal. Pupils are equal, round, and reactive to light.   Neck: Normal range of motion. Neck supple.   Cardiovascular: Normal rate and intact distal pulses.  Exam reveals no gallop and no friction rub.    No murmur heard.  Pulmonary/Chest: Effort  normal and breath sounds normal. No respiratory distress. She exhibits no tenderness.   Musculoskeletal: Normal range of motion.   Neurological: She is alert and oriented to person, place, and time.   Skin: Skin is warm and dry. She is not diaphoretic.   Psychiatric: She has a normal mood and affect. Her behavior is normal.   Nursing note and vitals reviewed.      Radiology Results  Radiology Results (24 Hour)     Procedure Component Value Units Date/Time    XR Chest 2 Views [[id number]] Collected:  08/25/14 1855    Order Status:  Completed Updated:  08/25/14 1857    Narrative:      Clinical History:  Shortness of Breath    Examination:  Frontal and lateral views of the chest.    Comparison:  Chest x-ray performed on May 03, 2014.    Findings:  Today's examination redemonstrates the moderate to severe cardiomegaly. The lungs are clear. No focal infiltrate or  pulmonary edema is seen. Moderate to severe degenerative change of both shoulders is reidentified.      Impression:      Moderate severe cardiomegaly is unchanged. The lungs are clear. No pulmonary edema or focal infiltrate is seen.    ReadingStation:[id number]          Lab Results  Results     Procedure Component Value Units Date/Time    I-Stat Troponin [[id number]] Collected:  08/25/14 1910    Specimen Information:  ISTAT Updated:  08/25/14 1945     I-STAT Notification Istat Notification     i-Stat Troponin [[id number]] Collected:  08/25/14 1927    Specimen Information:  Blood Updated:  08/25/14 1939     Trop I, ISTAT <0.02 ng/mL     BNP [[id number]]  (Abnormal) Collected:  08/25/14 1827    Specimen Information:  Blood Updated:  08/25/14 1920     B-Natriuretic Peptide 237.2 (H) pg/mL     Basic Metabolic Panel [[id number]] Collected:  08/25/14 1827    Specimen Information:  Plasma Updated:  08/25/14 1857  Sodium 139 mMol/L      Potassium 5.2 mMol/L      Chloride 102 mMol/L      CO2 25.7 mMol/L      CALCIUM 10.1 mg/dL      Glucose 94 mg/dL      Creatinine 9.14  mg/dL      BUN 14 mg/dL      Anion Gap 78.2 mMol/L      BUN/Creatinine Ratio 11.7 Ratio      EGFR 43 mL/min/1.5m2      Osmolality Calc 278 mOsm/kg     PT/INR [[id number]]  (Abnormal) Collected:  08/25/14 1827    Specimen Information:  Blood Updated:  08/25/14 1849     PT 22.1 (H) sec      PT INR 2.0 (H)     CBC [[id number]] Collected:  08/25/14 1827    Specimen Information:  Blood / Blood Updated:  08/25/14 1835     WBC 8.0 K/cmm      RBC 4.80 M/cmm      Hemoglobin 15.0 gm/dL      Hematocrit 52.8 %      MCV 97 fL      MCH 31 pg      MCHC 32 gm/dL      RDW 41.3 %      PLT CT 203 K/cmm      MPV 8.5 fL      NEUTROPHIL % 69.7 %      Lymphocytes 20.7 %      Monocytes 7.9 %      Eosinophils % 1.5 %      Basophils % 0.2 %      Neutrophils Absolute 5.6 K/cmm      Lymphocytes Absolute 1.7 K/cmm      Monocytes Absolute 0.6 K/cmm      Eosinophils Absolute 0.1 K/cmm      BASO Absolute 0.0 K/cmm               MDM and ED Course     ED Medication Orders     None           1752 EKG: atrial fibrillation, rate 75.      7:45 PM  Spoke to OBS       MDM        The patient presents with chest pain of unclear etiology.  Initial cardiac markers and EKG are nondiagnostic. Based upon the presentation the patient appears to be at low risk for PE, aortic dissection, pneumothorax, pneumonia or other serious conditions. The plan is to admit this patient for cardiac evaluation, observation and further testing.  The admission plan was discussed with the patient and/or family and they will comply.  Results of lab/radiology/EKG tests were discussed with the patient and/or family. All questions were answered and concerns addressed and appropriate consultation was obtained.    Procedures    Clinical Impression & Disposition     Clinical Impression  Final diagnoses:   Other chest pain        ED Disposition     Admit Bed Type: OBS Unit [31]  Admitting Physician: Theora Master [[id number]]  Patient Class: Observation [104]             Current Discharge  Medication List                  The documentation may have been recorded by my scribe, Matt Lillis , and reflects the services I personally performed and the  decisions made by me.  Edrick Kins., DO    This chart was generated by an EMR and may contain errors, including typographical, or omissions not intended by the user.       Edrick Kins., DO  08/25/14 2248

## 2014-08-25 NOTE — H&P (Signed)
[facility]  OBSERVATION UNIT  HISTORY AND PHYSICAL       Patient: Barbara [name]  Admission Date: 08/25/2014    DOB: 1928-02-09  Age: 80 y.o.    MRN: [id number]  Sex: female    PCP: Piedad Climes, MD  Attending:          HISTORY OF PRESENT ILLNESS     Barbara [name] is a 80 y.o. female who presented to the Emergency Room with middle of the chest pressure/pain. Patient states that she has been having episodic midsternal chest pressure at various stages of activity and rest in the last 2 days. She will not describe it as pain and she does describe it as a pressure. She just told family about it today. She states that it just goes away doesn't matter if she rests or not. She has had a stress test in her doctor's office. She has never seen a cardiologist. She did have any echo done this year in August which showed an EF Of 65% with mild LVH. She does have a history of atrial fibrillation and her EKG showed A. fib with a rate of 75 for which she is on Coumadin therapy, her INR was 2.0 in the ED. BNP is 237 and she does take a daily Lasix of 40 mg. Patient was a smoker many years ago. She also complained of sciatica pain which radiates down her right leg. And due to this the patient does use a walker at home. She does also have a history of PE but states she is not having any shortness of breath or pain with inspiration. Patient is being transferred to observation for further cardiac workup.     PAST MEDICAL HISTORY     Code Status: Prior    Past Medical History   Diagnosis Date   . HTN (hypertension)    . Pulmonary embolism    . PVD (peripheral vascular disease)    . Disorder of thyroid        Past Surgical History   Procedure Laterality Date   . Left arm     . Hysterectomy         Current/Home Medications    CHOLECALCIFEROL (VITAMIN D PO)    Take 1 tablet by mouth 2 (two) times daily.    FUROSEMIDE (LASIX) 40 MG TABLET    Take 1 tablet (40 mg total) by mouth daily.    GABAPENTIN (NEURONTIN) 300 MG  CAPSULE    Take 1 capsule (300 mg total) by mouth 3 (three) times daily.    LEVOTHYROXINE (SYNTHROID, LEVOTHROID) 88 MCG TABLET        LOSARTAN (COZAAR) 100 MG TABLET    Take 100 mg by mouth daily.    METOPROLOL (LOPRESSOR) 50 MG TABLET    Take 50 mg by mouth 2 (two) times daily.    PANTOPRAZOLE (PROTONIX) 40 MG TABLET    Take 40 mg by mouth daily.    WARFARIN (COUMADIN) 2.5 MG TABLET    Take 1 tablet (2.5 mg total) by mouth daily. Take daily until FU with me       Allergies:   Allergies   Allergen Reactions   . Codeine    . Erythromycin    . Iodine    . Penicillins    . Percocet [Oxycodone-Acetaminophen]    . Sulfa Antibiotics    . Tramadol    . Zithromax [Azithromycin]        History reviewed. No pertinent  family history.    SHx:  reports that she has never smoked. She has never used smokeless tobacco. She reports that she does not drink alcohol or use illicit drugs.    REVIEW OF SYSTEMS     Review of Systems   Constitutional: Negative.    HENT: Negative.    Eyes: Negative.    Respiratory: Negative.    Cardiovascular: Positive for chest pain. Negative for palpitations, orthopnea, claudication, leg swelling and PND.   Gastrointestinal: Negative.    Genitourinary: Negative.    Musculoskeletal: Positive for back pain.        Back pain with sciatica, arthritis in hips and knees   Skin: Negative.        PHYSICAL EXAM     Vital Signs: Blood pressure 165/67, pulse 85, temperature 98.2 F (36.8 C), resp. rate 24, height 1.6 m (5' 2.99"), weight 97.977 kg (216 lb), SpO2 100 %.  Constitutional: Well developed, well nourished, active, in no apparent distress.  HENT:   Head: Normocephalic, atraumatic  Ears: No external lesions.  Nose: No external lesions. No epistaxis or drainage.  Eyes: PERRL. No scleral icterus. No conjunctival injection. EOMI.  Neck: Trachea is midline. No JVD. Normal range of motion. No apparent masses.  Cardiovascular: Regular rhythm, S1 normal and S2 normal.    No murmur heard.  Pulmonary/Chest: Effort  normal. Lungs clear to auscultation bilaterally.   Abdominal: Soft, non-tender, non-distended. No masses.   Genitourinary/Anorectal: Deferred  Musculoskeletal: Normal range of motion. No deformity or apparent injury.   Neurological: Pt is alert. Cranial nerves are grossly intact. Moving all extremities without apparent deficit.   Psychiatric: Affect is appropriate. There is no agitation.   Skin: Skin is warm, dry, well perfused. No rash noted. No cyanosis. No pallor. No apparent wound.    LABS & IMAGING     Labs:  Results for orders placed or performed during the hospital encounter of 08/25/14   BNP   Result Value Ref Range    B-Natriuretic Peptide 237.2 (H) 0.0 - 100.0 pg/mL   CBC   Result Value Ref Range    WBC 8.0 4.0 - 11.0 K/cmm    RBC 4.80 3.80 - 5.00 M/cmm    Hemoglobin 15.0 12.0 - 16.0 gm/dL    Hematocrit 16.1 09.6 - 48.0 %    MCV 97 80 - 100 fL    MCH 31 28 - 35 pg    MCHC 32 32 - 36 gm/dL    RDW 04.5 40.9 - 81.1 %    PLT CT 203 130 - 440 K/cmm    MPV 8.5 6.0 - 10.0 fL    NEUTROPHIL % 69.7 42.0 - 78.0 %    Lymphocytes 20.7 15.0 - 46.0 %    Monocytes 7.9 3.0 - 15.0 %    Eosinophils % 1.5 0.0 - 7.0 %    Basophils % 0.2 0.0 - 3.0 %    Neutrophils Absolute 5.6 1.7 - 8.6 K/cmm    Lymphocytes Absolute 1.7 0.6 - 5.1 K/cmm    Monocytes Absolute 0.6 0.1 - 1.7 K/cmm    Eosinophils Absolute 0.1 0.0 - 0.8 K/cmm    BASO Absolute 0.0 0.0 - 0.3 K/cmm   Basic Metabolic Panel   Result Value Ref Range    Sodium 139 136 - 147 mMol/L    Potassium 5.2 3.5 - 5.3 mMol/L    Chloride 102 98 - 110 mMol/L    CO2 25.7 20.0 - 30.0 mMol/L  CALCIUM 10.1 8.5 - 10.5 mg/dL    Glucose 94 70 - 99 mg/dL    Creatinine 1.61 0.96 - 1.20 mg/dL    BUN 14 7 - 22 mg/dL    Anion Gap 04.5 7.0 - 18.0 mMol/L    BUN/Creatinine Ratio 11.7 10.0 - 30.0 Ratio    EGFR 43 mL/min/1.34m2    Osmolality Calc 278 275 - 300 mOsm/kg   PT/INR   Result Value Ref Range    PT 22.1 (H) 9.5 - 11.5 sec    PT INR 2.0 (H) 0.5 - 1.3   I-Stat Troponin   Result Value Ref Range     I-STAT Notification Istat Notification    i-Stat Troponin   Result Value Ref Range    Trop I, ISTAT <0.02 0.00 - 0.02 ng/mL       Imaging:  XR Chest 2 Views   Final Result   Moderate severe cardiomegaly is unchanged. The lungs are clear. No pulmonary edema or focal infiltrate is seen.      ReadingStation:[id number]          EKG: atrial fibrillation rate 75     EMERGENCY DEPARTMENT COURSE     ED Medication Orders     None          ED documentation including nurses notes were reviewed by myself.  The case was discussed with the ED Attending.    ASSESSMENT & PLAN     Barbara [name] is a 80 y.o. female admitted under OBSERVATION with chest pain .    Assessment & Plan:  1. Transferred observation  2. Monitor on telemetry  3. Serial cardiac enzymes  4. EKG in a.m.  5. Lexiscan in a.m., N/A to read  6. Cardiology consult as needed  7. Decadron one dose then start dexamethasone tomorrow.       Anticipate d/c home tomorrow        I certify the need for admission to the Observation Unit based on the patient's history and the information above.    Esmond Plants, NP   08/25/2014 8:16 PM

## 2014-08-25 NOTE — ED Notes (Signed)
Pt c/o chest pressure, midsternal since last night. Pt states nothing helped it, worse with inspiration. Pt is in afib, but takes warfarin, refused ASA for this reason.

## 2014-08-26 ENCOUNTER — Observation Stay: Payer: Medicare Other

## 2014-08-26 DIAGNOSIS — I272 Other secondary pulmonary hypertension: Secondary | ICD-10-CM

## 2014-08-26 DIAGNOSIS — R072 Precordial pain: Secondary | ICD-10-CM

## 2014-08-26 DIAGNOSIS — I481 Persistent atrial fibrillation: Secondary | ICD-10-CM

## 2014-08-26 DIAGNOSIS — I509 Heart failure, unspecified: Secondary | ICD-10-CM

## 2014-08-26 DIAGNOSIS — I739 Peripheral vascular disease, unspecified: Secondary | ICD-10-CM

## 2014-08-26 LAB — TROPONIN I
Troponin I: 0.01 ng/mL (ref 0.00–0.02)
Troponin I: 0.02 ng/mL (ref 0.00–0.02)

## 2014-08-26 LAB — LIPID PANEL
Cholesterol: 227 mg/dL — ABNORMAL HIGH (ref 75–199)
Coronary Heart Disease Risk: 5.4
HDL: 42 mg/dL — ABNORMAL LOW (ref 45–65)
LDL Calculated: 168 mg/dL
Triglycerides: 85 mg/dL (ref 10–150)
VLDL: 17 (ref 0–40)

## 2014-08-26 MED ORDER — REGADENOSON 0.4 MG/5ML IV SOLN
INTRAVENOUS | Status: AC
Start: 2014-08-26 — End: ?
  Filled 2014-08-26: qty 5

## 2014-08-26 MED ORDER — REGADENOSON 0.4 MG/5ML IV SOLN
0.40 mg | Freq: Once | INTRAVENOUS | Status: AC
Start: 2014-08-26 — End: 2014-08-26
  Administered 2014-08-26: 0.4 mg via INTRAVENOUS
  Filled 2014-08-26: qty 5

## 2014-08-26 MED ORDER — TECHNETIUM TC 99M SESTAMIBI - CARDIOLITE
49.50 | Freq: Once | Status: AC | PRN
Start: 2014-08-26 — End: 2014-08-26
  Administered 2014-08-26: 50 via INTRAVENOUS

## 2014-08-26 MED ORDER — PREDNISONE 10 MG PO KIT
10.00 mg | PACK | Freq: Every day | ORAL | Status: AC
Start: 2014-08-26 — End: 2014-09-01

## 2014-08-26 MED ORDER — TECHNETIUM TC 99M MEBROFENIN
16.37 | Freq: Once | Status: AC | PRN
Start: 2014-08-26 — End: 2014-08-26
  Administered 2014-08-26: 16 via INTRAVENOUS

## 2014-08-26 NOTE — Progress Notes (Signed)
Lexiscan cardiolite     Ordering: Puray   PCP Florian Buff MD    Reason for stopping: per lexiscan protocol. Pt unable to walk     Indication: CP     1. Baseline EKG: afib  2. lexiscan 0.4 mg injected over 10 sec per protocol  3. Appropriate hemodynamic response  4. Symptom appropriate with nausea  5. Electrically nondiagnostic  IMAGES PENDING     E.Vail Basista PAC / T. NASHED MD

## 2014-08-26 NOTE — Plan of Care (Signed)
Problem: Psychosocial and Spiritual Needs  Goal: Demonstrates ability to cope with hospitalization/illness  Outcome: Progressing

## 2014-08-26 NOTE — Plan of Care (Signed)
Problem: Pain  Goal: Patient's pain/discomfort is manageable  Intervention: Include patient/family/caregiver in decisions related to pain management  Pt will have a manageable pain level in hosp and be included in plan

## 2014-08-26 NOTE — Discharge Instructions (Signed)
Chest Pain, Uncertain Cause  Chest pain can happen for a number of reasons. Sometimes the cause can not be determined. If yourcondition does not seem serious, and your pain does not appear to be coming from your heart, your doctor may recommend watching it closely. Sometimes the signs of a serious problem take more time to appear. Therefore, watch for the warning signs listed below.  Home care  After your visit, follow these recommendations:   Rest today and avoid strenuous activity.   Take any prescribed medicine as directed.  Follow-up care  Follow up with your doctor or this facility as instructed or if you do not start to feel better within 24 hours.  Call 911  Get immediate medical attention if any of the following occur:   A change in the type of pain: if it feels different, becomes more severe, lasts longer, or begins to spread into your shoulder, arm, neck, jaw or back   Shortness of breath or increased pain with breathing   Weakness, dizziness, or fainting   Rapid heart beat  Get prompt medical attention  Call your doctor right away if any of the following occur:   Cough with dark colored sputum (phlegm) or blood   Fever of 100.4F(38C) or higher, or as directed by your health care provider   Swelling, pain or redness in one leg   2000-2015 [employer] [address], [place] [address]. All rights reserved. This information is not intended as a substitute for professional medical care. Always follow your healthcare professional's instructions.

## 2014-08-26 NOTE — Progress Notes (Signed)
Pt seen by MD after stress test and will d/c home,MD also to speak with pt and family to answer questions prior to d/c,pt stating anxious about d/c with ride and family present to take home,pt stating can get upset easily,d/c paperwork reviewed with pt regarding meds,MD f/u visit,script given with pt stating understanding,pt d/c via w/c taking all belongings with no c/o voiced or further questions at time of d/c

## 2014-08-26 NOTE — Discharge Summary (Signed)
[facility] OBSERVATION UNIT      Patient: Barbara [name]  Admission Date: 08/25/2014   DOB: 22-May-1928  Discharge Date: 08/26/2014    MRN: [id number]  Discharge Attending: Dedra Skeens   Referring Physician: Piedad Climes, MD  PCP: Piedad Climes, MD       DISCHARGE SUMMARY     Discharge Information   Admission Diagnosis:   Chest pain     Discharge Diagnosis:   Patient Active Problem List    Diagnosis Date Noted   . Precordial pain 08/25/2014   . Persistent atrial fibrillation 05/04/2014   . History of pulmonary embolism 05/04/2014   . Thromboembolic pulmonary hypertension 05/04/2014   . Chronic kidney disease (CKD), stage III (moderate) 05/04/2014   . Chest pain 05/03/2014   . Hypertension 05/03/2014   . Hypothyroid 05/03/2014   . Chronic anticoagulation 05/03/2014   . Esophagitis, unspecified 05/03/2014   . CHF (congestive heart failure) 05/03/2014        Discharge Medications:     Medication List      CHANGE how you take these medications          gabapentin 300 MG capsule   Commonly known as:  NEURONTIN   Take 1 capsule (300 mg total) by mouth 3 (three) times daily.   What changed:  when to take this         CONTINUE taking these medications          furosemide 40 MG tablet   Commonly known as:  LASIX   Take 1 tablet (40 mg total) by mouth daily.       levothyroxine 88 MCG tablet   Commonly known as:  SYNTHROID, LEVOTHROID       losartan 100 MG tablet   Commonly known as:  COZAAR       metoprolol 50 MG tablet   Commonly known as:  LOPRESSOR       pantoprazole 40 MG tablet   Commonly known as:  PROTONIX       VITAMIN D PO       * warfarin 1 MG tablet   Commonly known as:  COUMADIN       * warfarin 2 MG tablet   Commonly known as:  COUMADIN       * Notice:  This list has 2 medication(s) that are the same as other medications prescribed for you. Read the directions carefully, and ask your doctor or other care provider to review them with you.              Hospital Course   Presentation History    Patient was admitted through the emergency department because of substernal pressure.    Hospital Course (1 Days)   Serial enzymes were negative. Nuclear medicine stress test had no acute changes.    Procedures/Imaging:   XR Chest 2 Views   Final Result   Moderate severe cardiomegaly is unchanged. The lungs are clear. No pulmonary edema or focal infiltrate is seen.      ReadingStation:[id number]      NM Myocardial Perfusion Spect    (Results Pending)   Cardiac Stress Test    (Results Pending)       Treatment Team:   Attending Provider: Dedra Skeens, MD       Progress Note/Physical Exam at Discharge   Filed Vitals:    08/26/14 0730 08/26/14 0731 08/26/14 1224 08/26/14 1411   BP: 193/93 187/90 169/92 141/85  Pulse: 69 69 69 76   Temp: 97.6 F (36.4 C)  97.9 F (36.6 C)    TempSrc: Oral  Oral    Resp:       Height:       Weight:       SpO2: 100%  99%        Patient is alert and oriented with no acute distress. Lungs are clear. Heart has regular rhythm. Abdomen soft nontender.      Diagnostics     Stress Test Results: Negative EKG response to exercise stress at heart rate achieved.    Labs/Studies Pending at Discharge: No    Last Labs     Recent Labs  Lab 08/25/14  1827   WBC 8.0   RBC 4.80   HEMOGLOBIN 15.0   HEMATOCRIT 46.3   MCV 97   PLATELETS 203         Recent Labs  Lab 08/25/14  1827   SODIUM 139   POTASSIUM 5.2   CHLORIDE 102   CO2 25.7   BUN 14   CREATININE 1.20   GLUCOSE 94   CALCIUM 10.1        Patient Instructions   Discharge Diet: regular diet  Discharge Activity:  activity as tolerated    Follow Up Appointment:        Follow-up Information     Follow up with Piedad Climes, MD.    Specialty:  Internal Medicine    Contact information:    [address]  [place] [address]  [phone]             Time spent examining patient, discussing with patient/family regarding hospital course, chart review, reconciling medications and discharge planning: 20 minutes.      Donalynn Furlong, MD    3:18  PM 08/26/2014

## 2014-08-28 NOTE — UM Notes (Signed)
[facility] Utilization Management Review Sheet    NAME: Barbara [name]  MR#: [id number]    CSN#: [id number]    ROOM: 2520/2520-A AGE: 80 y.o.    ADMIT DATE AND TIME: 08/25/2014  5:43 PM      PATIENT CLASS:observation     ATTENDING PHYSICIAN: No att. providers found  PAYOR:Payor: MEDICARE / Plan: MEDICARE PART A AND B / Product Type: *No Product type* /       AUTH #: Medicare    DIAGNOSIS: chest pain       HISTORY:   Past Medical History   Diagnosis Date   . HTN (hypertension)    . Pulmonary embolism    . PVD (peripheral vascular disease)    . Disorder of thyroid    . Seasonal allergic rhinitis    . Gastroesophageal reflux disease    . Arthritis        DATE OF REVIEW: 08/28/2014    VITALS: BP 170/89 mmHg  Pulse 78  Temp(Src) 98 F (36.7 C) (Oral)  Resp 20  Ht 1.6 m (5' 2.99")  Wt 97.977 kg (216 lb)  BMI 38.27 kg/m2  SpO2 98%    Presents to the ED with chest pain:     Vital signs:  98.2-90-31  SPO2 99%  193/93     Abnormal labs:  BNP 237  INR 2.0    Admitted to the obs unit:  Serial enzymes negative   Stress test was negative     Nickola Major RN  Utilization Management  (p) [phone]  [phone]

## 2014-09-14 ENCOUNTER — Encounter (HOSPITAL_BASED_OUTPATIENT_CLINIC_OR_DEPARTMENT_OTHER): Admission: RE | Payer: Self-pay | Source: Ambulatory Visit

## 2014-09-14 ENCOUNTER — Ambulatory Visit (HOSPITAL_BASED_OUTPATIENT_CLINIC_OR_DEPARTMENT_OTHER): Admission: RE | Admit: 2014-09-14 | Payer: Medicare Other | Source: Ambulatory Visit | Admitting: Gastroenterology

## 2014-09-14 SURGERY — DONT USE, USE 1095-ESOPHAGOGASTRODUODENOSCOPY (EGD), DIAGNOSTIC
Anesthesia: Conscious Sedation | Site: Abdomen

## 2015-04-03 ENCOUNTER — Ambulatory Visit
Admission: RE | Admit: 2015-04-03 | Discharge: 2015-04-03 | Disposition: A | Discharge status: Home / Self Care | Payer: Medicare Other | Source: Ambulatory Visit | Attending: Gastroenterology | Admitting: Gastroenterology

## 2015-04-03 ENCOUNTER — Encounter (HOSPITAL_BASED_OUTPATIENT_CLINIC_OR_DEPARTMENT_OTHER)
Admission: RE | Disposition: A | Discharge status: Home / Self Care | Payer: Self-pay | Source: Ambulatory Visit | Attending: Gastroenterology

## 2015-04-03 ENCOUNTER — Encounter (HOSPITAL_BASED_OUTPATIENT_CLINIC_OR_DEPARTMENT_OTHER): Payer: Self-pay

## 2015-04-03 ENCOUNTER — Encounter (HOSPITAL_BASED_OUTPATIENT_CLINIC_OR_DEPARTMENT_OTHER): Payer: Self-pay | Admitting: Anesthesiology

## 2015-04-03 ENCOUNTER — Ambulatory Visit (HOSPITAL_BASED_OUTPATIENT_CLINIC_OR_DEPARTMENT_OTHER): Payer: Medicare Other | Admitting: Gastroenterology

## 2015-04-03 DIAGNOSIS — K219 Gastro-esophageal reflux disease without esophagitis: Secondary | ICD-10-CM | POA: Insufficient documentation

## 2015-04-03 DIAGNOSIS — I10 Essential (primary) hypertension: Secondary | ICD-10-CM | POA: Insufficient documentation

## 2015-04-03 DIAGNOSIS — R1310 Dysphagia, unspecified: Secondary | ICD-10-CM | POA: Insufficient documentation

## 2015-04-03 DIAGNOSIS — Z5309 Procedure and treatment not carried out because of other contraindication: Secondary | ICD-10-CM | POA: Insufficient documentation

## 2015-04-03 HISTORY — DX: Heart failure, unspecified: I50.9

## 2015-04-03 HISTORY — DX: Dysphagia, unspecified: R13.10

## 2015-04-03 SURGERY — DONT USE, USE 1095-ESOPHAGOGASTRODUODENOSCOPY (EGD), DIAGNOSTIC
Anesthesia: Conscious Sedation | Site: Abdomen | Wound class: Clean Contaminated

## 2015-04-03 MED ORDER — PROPOFOL 10 MG/ML IV EMUL (WRAP)
INTRAVENOUS | Status: AC
Start: 2015-04-03 — End: ?
  Filled 2015-04-03: qty 20

## 2015-04-03 SURGICAL SUPPLY — 19 items
CATH GLD PROBE BICAP 7FX210CM (Supply) IMPLANT
CATH GLD PROBE BICAP 7FX300CM (Supply) IMPLANT
CLIP QUICKPRO REPOSITIONAL (Supply) IMPLANT
CRE BALLOON 10-12 DILATOR 5835 (Supply) IMPLANT
CRE BALLOON 12-15 DILATOR 5836 (TDC (Tubes, Draines, Catheters))
CRE BALLOON 12-15 DILATOR 5836 (TDC (Tubes, Drains, Catheters)) IMPLANT
CRE BALLOON 15-18 DILATOR 5837 (Supply) IMPLANT
CRE BALLOON 18-20 DILATOR 5838 (Supply) IMPLANT
CRE BALLOON 6-8 DILAT 5833 (Supply) IMPLANT
CRE BALLOON 8-10 DILAT 5834 (Supply) IMPLANT
CRE PYLORIC COL 10-12 DIL 5847 (Supply) IMPLANT
CRE PYLORIC COL 12-15 DIL 5848 (Supply) IMPLANT
CRE PYLORIC COL 15-18 DIL 5849 (Supply) IMPLANT
CRE PYLORIC COL 8-10 DIL 5846 (Supply) IMPLANT
DIL CRE 18-20 PYL CLN 5850 (Supply) IMPLANT
FORCEP BIOPSY RAD JAW 1333-40 (Supply) IMPLANT
MARKER ENDOSCOPIC SPOT (Supply) IMPLANT
NDL INTERJECT SCLERO 25G (Supply) IMPLANT
SYSTEM INFLATION ALLIANCE II (Supply) IMPLANT

## 2015-04-03 NOTE — Progress Notes (Signed)
Pt BP reading high, dr Jennette Kettle and dr Kizzie Ide made aware and are in to see pt. Pt states she did not take her BP meds this am and is nervous. Dr Kizzie Ide and Dr Jennette Kettle recommend rescheduling procedure for another time.

## 2015-04-03 NOTE — Anesthesia Preprocedure Evaluation (Signed)
Anesthesia Plan

## 2015-06-01 ENCOUNTER — Other Ambulatory Visit: Payer: Self-pay | Admitting: Specialist

## 2015-06-01 DIAGNOSIS — K449 Diaphragmatic hernia without obstruction or gangrene: Secondary | ICD-10-CM

## 2015-06-06 ENCOUNTER — Emergency Department
Admission: EM | Admit: 2015-06-06 | Discharge: 2015-06-06 | Disposition: A | Discharge status: Home / Self Care | Payer: Medicare Other | Attending: Emergency Medicine | Admitting: Emergency Medicine

## 2015-06-06 ENCOUNTER — Emergency Department: Payer: Medicare Other

## 2015-06-06 DIAGNOSIS — R0602 Shortness of breath: Secondary | ICD-10-CM | POA: Insufficient documentation

## 2015-06-06 DIAGNOSIS — R0600 Dyspnea, unspecified: Secondary | ICD-10-CM

## 2015-06-06 DIAGNOSIS — I482 Chronic atrial fibrillation, unspecified: Secondary | ICD-10-CM

## 2015-06-06 DIAGNOSIS — I10 Essential (primary) hypertension: Secondary | ICD-10-CM | POA: Insufficient documentation

## 2015-06-06 DIAGNOSIS — R1310 Dysphagia, unspecified: Secondary | ICD-10-CM | POA: Insufficient documentation

## 2015-06-06 LAB — CBC AND DIFFERENTIAL
Basophils %: 0.5 % (ref 0.0–3.0)
Basophils Absolute: 0 10*3/uL (ref 0.0–0.3)
Eosinophils %: 0.9 % (ref 0.0–7.0)
Eosinophils Absolute: 0.1 10*3/uL (ref 0.0–0.8)
Hematocrit: 44.8 % (ref 36.0–48.0)
Hemoglobin: 14.8 gm/dL (ref 12.0–16.0)
Lymphocytes Absolute: 1.7 10*3/uL (ref 0.6–5.1)
Lymphocytes: 19.7 % (ref 15.0–46.0)
MCH: 30 pg (ref 28–35)
MCHC: 33 gm/dL (ref 32–36)
MCV: 92 fL (ref 80–100)
MPV: 8.3 fL (ref 6.0–10.0)
Monocytes Absolute: 0.5 10*3/uL (ref 0.1–1.7)
Monocytes: 6.2 % (ref 3.0–15.0)
Neutrophils %: 72.7 % (ref 42.0–78.0)
Neutrophils Absolute: 6.2 10*3/uL (ref 1.7–8.6)
PLT CT: 229 10*3/uL (ref 130–440)
RBC: 4.88 10*6/uL (ref 3.80–5.00)
RDW: 13.9 % (ref 11.0–14.0)
WBC: 8.5 10*3/uL (ref 4.0–11.0)

## 2015-06-06 LAB — BASIC METABOLIC PANEL
Anion Gap: 14.8 mMol/L (ref 7.0–18.0)
BUN / Creatinine Ratio: 7.7 Ratio — ABNORMAL LOW (ref 10.0–30.0)
BUN: 9 mg/dL (ref 7–22)
CO2: 25.5 mMol/L (ref 20.0–30.0)
Calcium: 9.9 mg/dL (ref 8.5–10.5)
Chloride: 101 mMol/L (ref 98–110)
Creatinine: 1.17 mg/dL (ref 0.60–1.20)
EGFR: 44 mL/min/{1.73_m2}
Glucose: 98 mg/dL (ref 70–99)
Osmolality Calc: 272 mOsm/kg — ABNORMAL LOW (ref 275–300)
Potassium: 4.3 mMol/L (ref 3.5–5.3)
Sodium: 137 mMol/L (ref 136–147)

## 2015-06-06 LAB — D-DIMER, QUANTITATIVE: D-Dimer: 0.3 mg/L FEU (ref 0.19–0.52)

## 2015-06-06 LAB — B-TYPE NATRIURETIC PEPTIDE: B-Natriuretic Peptide: 233.7 pg/mL — ABNORMAL HIGH (ref 0.0–100.0)

## 2015-06-06 NOTE — ED Provider Notes (Signed)
[facility]  EMERGENCY DEPARTMENT  History and Physical Exam       Patient Name: Barbara [name], Barbara [name]  Encounter Date:  06/06/2015  Physician Assistant: Gweneth Dimitri, PA-C  Attending Physician: No att. providers found  PCP: Piedad Climes, MD  Patient DOB:  14-Nov-1927  MRN:  [id number]  Room:  E58/E58-A      History of Presenting Illness     Chief complaint: Other    HPI/ROS given by: Patient and son    Barbara [name] is a 81 y.o. female who presents with shortness of breath. She has a history of dysphagia and esophageal stricture. She had previous EGD with dilation. She has been having difficulty swallowing solids for almost one year. She had difficulty swallowing a boiled egg and toast his afternoon. Later during the day she had difficulty swallowing an apple. She felt like a piece of apple was stuck. She was choking, gasping for air, and felt like she could not breathe. Her son gave her some water, but she threw it up. Then he gave her some ginger ale and she had no problems since.     She has a barium swallow scheduled for June 12, 2015.    Her son called EMS, because he was worried that she could not breathe. He was most concern about her blood pressure was elevated. By the time EMS came, she was back to her normal self. She is worried about CHF. She has a history of hypertension and she is on metoprolol and Cozaar. She has clonidine that she takes as needed.     Note: She has chronic atrial fib and she is on Coumadin.  Review of Systems     Review of Systems   Constitutional: Negative for fever, chills and unexpected weight change.   HENT: Negative.    Eyes: Negative.    Respiratory: Positive for choking and shortness of breath.         SOB and choking has resolved.   Cardiovascular: Negative for chest pain and palpitations.   Gastrointestinal: Negative for nausea, vomiting and abdominal pain.        Dysphagia.   Genitourinary: Negative.    Musculoskeletal: Negative.    Skin: Negative for rash.    Neurological: Negative.       Rest of review of systems is negative.  Allergies & Medications     Pt is allergic to codeine; erythromycin; iodine; penicillins; percocet; sulfa antibiotics; tramadol; and zithromax.    Discharge Medication List as of 06/06/2015 10:28 PM      CONTINUE these medications which have NOT CHANGED    Details   cloNIDine (CATAPRES) 0.1 MG tablet , Starting 05/07/2015, Until Discontinued, Historical Med      losartan (COZAAR) 100 MG tablet Take 100 mg by mouth daily., Until Discontinued, Historical Med      metoprolol (LOPRESSOR) 50 MG tablet Take 50 mg by mouth 2 (two) times daily., Until Discontinued, Historical Med      Cholecalciferol (VITAMIN D PO) Take 1 tablet by mouth 2 (two) times daily., Until Discontinued, Historical Med      furosemide (LASIX) 20 MG tablet Starting 03/06/2015, Until Discontinued, Historical Med      gabapentin (NEURONTIN) 300 MG capsule Take 1 capsule (300 mg total) by mouth 3 (three) times daily., Starting 05/05/2014, Until Discontinued, No Print      levothyroxine (SYNTHROID, LEVOTHROID) 88 MCG tablet Starting 03/05/2014, Until Discontinued, Historical Med      nitrofurantoin (MACRODANTIN) 50 MG  capsule Starting 04/02/2015, Until Discontinued, Historical Med      pantoprazole (PROTONIX) 40 MG tablet Take 40 mg by mouth daily., Until Discontinued, Historical Med      !! warfarin (COUMADIN) 1 MG tablet Take 1 mg by mouth daily., Until Discontinued, Historical Med      !! warfarin (COUMADIN) 2 MG tablet Take 2 mg by mouth daily., Until Discontinued, Historical Med       !! - Potential duplicate medications found. Please discuss with provider.           Past Medical History     Pt has a past medical history of HTN (hypertension); Pulmonary embolism; PVD (peripheral vascular disease); Disorder of thyroid; Seasonal allergic rhinitis; Gastroesophageal reflux disease; Arthritis; Congestive heart failure; and Dysphagia.     Past Surgical History     Pt  has past surgical  history that includes left arm; Hysterectomy; and Colonoscopy.     Family History     The family history is not on file.     Social History     Pt reports that she has never smoked. She has never used smokeless tobacco. She reports that she does not drink alcohol or use illicit drugs.     Physical Exam     Blood pressure 179/90, pulse 69, temperature 98.3 F (36.8 C), temperature source Oral, resp. rate 18, height 1.6 m, weight 99.338 kg, SpO2 97 %.      Constitutional: Not in distress.  Well-hydrated.  HEENT: Eyes: PERRL, no scleral icterus or conjunctival pallor. Nose: normal appearance.  Oral: MMM, oropharynx without erythema or exudate.  Neck: Trachea midline, supple, no thyroidmegaly or masses.  Cardiovascular: RRR, normal S1 S2, no murmurs, gallops, or rubs.   Respiratory: Normal rate. Clear to auscultation bilaterally.  Gastrointestinal: +BS, non-distended, soft, non-tender, no rebound or guarding, no hepatosplenomegaly.  Musculoskeletal: Normal appearance.  Extremities: No edema or calf tenderness.  Skin: No rashes, lesions, or jaundice.  Neurologic: CN 2-12 grossly intact. No gross motor or sensory deficits.  Psychiatric: AAOx3, affect and mood appropriate. Alert, interactive, and appropriate.         Diagnostic Results     The results of the diagnostic studies below have been reviewed by myself:    Labs  Results     Procedure Component Value Units Date/Time    B-type Natriuretic Peptide [[id number]]  (Abnormal) Collected:  06/06/15 2137    Specimen Information:  Blood Updated:  06/06/15 2234     B-Natriuretic Peptide 233.7 (H) pg/mL     Basic Metabolic Panel [[id number]]  (Abnormal) Collected:  06/06/15 2137    Specimen Information:  Plasma Updated:  06/06/15 2227     Sodium 137 mMol/L      Potassium 4.3 mMol/L      Chloride 101 mMol/L      CO2 25.5 mMol/L      Calcium 9.9 mg/dL      Glucose 98 mg/dL      Creatinine 7.82 mg/dL      BUN 9 mg/dL      Anion Gap 95.6 mMol/L      BUN/Creatinine Ratio 7.7 (L)  Ratio      EGFR 44 mL/min/1.55m2      Osmolality Calc 272 (L) mOsm/kg     D-dimer, quantitative [[id number]] Collected:  06/06/15 2137    Specimen Information:  Blood Updated:  06/06/15 2217     D-Dimer 0.30 mg/L FEU     CBC [[id number]] Collected:  06/06/15 2137  Specimen Information:  Blood from Blood Updated:  06/06/15 2207     WBC 8.5 K/cmm      RBC 4.88 M/cmm      Hemoglobin 14.8 gm/dL      Hematocrit 16.1 %      MCV 92 fL      MCH 30 pg      MCHC 33 gm/dL      RDW 09.6 %      PLT CT 229 K/cmm      MPV 8.3 fL      NEUTROPHIL % 72.7 %      Lymphocytes 19.7 %      Monocytes 6.2 %      Eosinophils % 0.9 %      Basophils % 0.5 %      Neutrophils Absolute 6.2 K/cmm      Lymphocytes Absolute 1.7 K/cmm      Monocytes Absolute 0.5 K/cmm      Eosinophils Absolute 0.1 K/cmm      BASO Absolute 0.0 K/cmm           Radiologic Studies  Xr Chest 2 Views    06/06/2015  Mild pulmonary vascular congestion. No focal pulmonary consolidation, pneumothorax, or pleural effusion. Marked cardiomegaly as previous. No acute osseous abnormality. ReadingStation:[id number]             ED Course and Medical Decision Making     ED Medication Orders     None        Patient was seen for dysphagia, SOB, and hyperternsion. She currently does not have a food bolus stuck in her esophagus. She was able to drink water without any difficulty while in the emergency room. Her SOB has completely resolved.    There was no evidence for pneumonia, CHF, pulmonary embolism, pneumothorax, cardiac tamponade, or acute cardiopulmonary problems.    EKG shows atrial fibrillation with ventricular rate of 61.    Chest x-ray was unremarkable.    CBC, BMP, and d-dimer were normal. BNP was 233.    She will follow-up with her PCP in a few days to manage her blood pressure. She should take her clonidine if her blood pressure is > 160/90.     She should keep her appointment for the barium swallow. She may eventually need an EGD.    She was advised to return to the emergency  room for worsening or new symptoms.    In addition to the above history, please see nursing notes. Allergies, meds, past medical, family, social hx, and the results of the diagnostic studies performed have been reviewed by myself.    I discussed this case with the attending physician in the emergency department and they agree with the assessment and treatment plan.     This chart was generated by an EMR and may contain errors or omissions not intended by the user.     Procedures / Critical Care     None     Diagnosis / Disposition     Clinical Impression  1. Dyspnea, unspecified type    2. Dysphagia, unspecified type    3. Chronic atrial fibrillation    4. Essential hypertension        Disposition  ED Disposition     Discharge [facility] discharge to home/self care.    Condition at disposition: Stable              Follow up for Discharged Patients  Piedad Climes, MD  [address]  [place] [address]  [phone]    Schedule an appointment as soon as possible for a visit in 1 week        Prescriptions for Discharged Patients  Discharge Medication List as of 06/06/2015 10:28 PM                   Margret Chance, [place]  06/07/15 1243    Myna Hidalgo, MD  06/09/15 2348

## 2015-06-06 NOTE — Discharge Instructions (Signed)
Understanding Dysphagia    If you have a problem swallowing foods or liquids, you may have dysphagia. This condition has a number of causes. Your doctor can find out what is causing your problem and help relieve your symptoms.  Causes of Dysphagia  With dysphagia, foods or liquids do not easily pass down the esophagus. Dysphagia may occur if the esophagus walls thicken, causing a narrowing (stricture) of the passage. Dysphagia can also be caused by any of the following:   A problem in the esophagus, such as an ulcer,stricture, irritation, infection, or cancer   Muscles in your mouth, throat, or esophagus that don't work right or are not coordinated   A nerve or brain problem (such as a stroke) that leaves your mouth, tongue, or throat muscles weak or changes how your muscles coordinate  Common Symptoms  If you have dysphagia, you may:   Feel chest pressure or pain when you swallow   Choke or cough when swallowing   Vomit after eating or drinking   Aspirate (inhale into the lungs) foods or liquids when you swallow   Have fatigue and weight loss   2000-2015 [employer] [address], [place], [place] [address]. All rights reserved. This information is not intended as a substitute for professional medical care. Always follow your healthcare professional's instructions.          Shortness of Breath (Dyspnea)  Shortness of breath is thefeelingthat you can't catch your breath or get enough air. It is also known as dyspnea.  Dyspnea can be caused by many different conditions. They include:   Acute asthma attack   Worsening ofCOPD, chronic bronchitis, oremphysema   Heart failure. This is a weak heart muscle that allows extra fluid to collect inthe lungs.   Panic attacks or anxiety. Fear can cause rapid breathing (hyperventilation).   Pneumonia, or an infection in the lung tissue.   Exposure to toxicsubstances,fumes, smoke, or certain medicines   Blood clot in the lung (pulmonary  embolism). This is often from a piece of blood clot in a deep vein of the leg (deep vein thrombosis) that breaks off and travels to the lungs.   Heart attack or heart-related chest pain (angina)   Anemia   Collapsed lung (pneumothorax)   Dehydration   Pregnancy  Shortness of breath can also be caused by a blood clot in the lungs. Symptoms include:   Chest pain   Trouble breathing or shortness of breath   Fast heartbeat   Coughing (may cough up blood)   Sweating   Fainting  Based on your visit today, the exact cause of your shortness of breath is not certain. Your tests don't show any of the serious causes of dyspnea. You may need other tests to find out if you have a serious problem. It's important to watch for any new symptoms or symptoms that get worse. Follow up with your health care provider as directed.  Home care  Follow these tips to take care of yourself at home:   When your symptoms are better, go back to your usual activities.   If you smoke, you need to stop. Join a stop-smoking program or ask your health care provider for help.   Eat a healthy diet and get plenty of sleep.   Get regular exercise. But talk with your health care provider before starting to exercise, especially if you have other medical problems.   Cut down on the amount of caffeine and stimulants you have.  Follow-up care  Follow up with your health care provider.  If a culture was done, you will be told if your treatment needs to be changed. You can call in 2 to 3 days, or as directed, for the results.  If X-rays were taken, and a radiologist did not see them while you were here, they will be reviewed. You will be told if there is a change in the reading, especially if it affects your treatment.  When to call 911  Shortness of breath may be a sign of a serious medical problem. For example, it may be a problem with your heart or lungs.Call 911if you have shortness of breath or trouble breathing, especially with any of  the other symptoms below:   You are confused or it's difficult to wake you   You faint or lose consciousness   You have a fast heartbeat   You are coughing up blood   You have pain in your chest, arm, shoulder, neck, or upper back  When to seek medical advice  Call your health care provider right awayif any of these occur:   Slight shortness of breath or wheezing   Redness, pain or swelling in your leg, arm, or other body area   Swelling in both legs or ankles   Fast weight gain   Dizzinessorweakness   Feeling your heart beat fast or hard (palpitations)   Fever of 100.45F (38C) or higher, or as directed by your health care provider   2000-2015 [employer] [facility] [address], [place], [place] [address]. All rights reserved. This information is not intended as a substitute for professional medical care. Always follow your healthcare professional's instructions.          Established High Blood Pressure    High blood pressure (hypertension) is a chronic disease. Often health care providers don't know what causes it. But it can be caused by certain health conditions and medicines.  If you have high blood pressure, you may not have any symptoms. If you do have symptoms, they may include headache, dizziness, changes in your vision, chest pain, and shortness of breath. But even without symptoms, high blood pressure that's not treated raises your risk for heart attack and stroke. High blood pressure is a serious health risk and shouldn't be ignored.  A blood pressure reading is made up of two numbers: a higher number over a lower number.The top number is the systolic pressure. The bottom number is the diastolic pressure.A normal blood pressure is less than 120 over less than 80. High blood pressure is when either the top number is 140 or higher, or the bottom number is 90 or higher. This must be the result when taking your blood pressure a number of times.The blood pressures between normal and  high are called prehypertension.  Home care  If you have high blood pressure, you should do what is listed below to lower your blood pressure. If you are taking medicines for high blood pressure, these methods may reduce or end your need for medicines in the future.   Begin a weight-loss program if you are overweight.   Cut back on how much salt you get in your diet. Here's how to do this:   Don't eat foods that have a lot of salt. These include olives, pickles, smoked meats, and salted potato chips.   Don't add salt to your food at the table.   Use only small amounts of salt when cooking.  Begin an exercise program. Talk with your health care provider about the type of exercise program that would be best for you. It doesn't have to be hard. Even brisk walking for 20 minutes 3 times a week is a good form of exercise.   Don't take medicines that have heart stimulants. This includes many cold and sinus decongestant pills and sprays, as well as diet pills. Check the warnings about hypertension on the label. Stimulants such as amphetamine or cocaine could be lethal for someone with high blood pressure. Never take these.   Limit how much caffeine you get in your diet. Switch to caffeine-free products.   Stop smoking. If you are a long-time smoker, this can be hard. Enroll in a stop-smoking program to make it more likely that you will quit for good.   Learn how to handle stress. This is an important part of any program to lower blood pressure. Learn about relaxation methods like meditation, yoga, or biofeedback.   If your provider prescribed medicines, take them exactly as directed. Missing doses may cause your blood pressure get out of control.   Consider buying an automatic blood pressure machine. You can get one of these at most pharmacies. Use this to watch your blood pressure at home. Give the results to your provider.  Follow-up care  You will need to make regular visits to your health care provider.  This is to check your blood pressure and to make changes to your medicines. Make a follow-up appointment as directed.  When to seek medical advice  Call your health care provider right away if any of these occur:   Chest pain or shortness of breath   Severe headache   Throbbing or rushing sound in the ears   Nosebleed   Sudden severe pain in your belly (abdomen)   Extreme drowsiness, confusion, or fainting   Dizziness or dizziness with a spinning sensation (vertigo)   Weakness of an arm or leg or one side of the face   You have problems speaking or seeing   2000-2015 [employer] [address], [place], [place] [address]. All rights reserved. This information is not intended as a substitute for professional medical care. Always follow your healthcare professional's instructions.

## 2015-06-06 NOTE — ED Notes (Signed)
Bed: RAU-F  Expected date:   Expected time:   Means of arrival:   Comments:  EMS ETA 1831

## 2015-06-06 NOTE — ED Notes (Signed)
Pt has history of swallowing problems, pt has a barium swallow next week. Pt couldn't get food down today.

## 2015-06-11 LAB — ECG 12-LEAD
Patient Age: 87 years
Q-T Interval(Corrected): 423 ms
Q-T Interval: 420 ms
QRS Axis: -17 deg
QRS Duration: 102 ms
T Axis: -8 years
Ventricular Rate: 61 //min

## 2015-06-13 ENCOUNTER — Ambulatory Visit (HOSPITAL_BASED_OUTPATIENT_CLINIC_OR_DEPARTMENT_OTHER): Admit: 2015-06-13 | Payer: Self-pay | Admitting: Gastroenterology

## 2015-06-13 ENCOUNTER — Encounter (HOSPITAL_BASED_OUTPATIENT_CLINIC_OR_DEPARTMENT_OTHER): Payer: Self-pay

## 2015-06-13 SURGERY — DONT USE, USE 1095-ESOPHAGOGASTRODUODENOSCOPY (EGD), DIAGNOSTIC
Anesthesia: Conscious Sedation | Site: Abdomen

## 2015-06-14 ENCOUNTER — Ambulatory Visit
Admission: RE | Admit: 2015-06-14 | Discharge: 2015-06-14 | Disposition: A | Discharge status: Home / Self Care | Payer: Medicare Other | Source: Ambulatory Visit | Attending: Specialist | Admitting: Specialist

## 2015-06-14 ENCOUNTER — Ambulatory Visit: Payer: Medicare Other

## 2015-06-14 DIAGNOSIS — K224 Dyskinesia of esophagus: Secondary | ICD-10-CM | POA: Insufficient documentation

## 2015-06-14 DIAGNOSIS — R1013 Epigastric pain: Secondary | ICD-10-CM | POA: Insufficient documentation

## 2015-06-14 DIAGNOSIS — M2578 Osteophyte, vertebrae: Secondary | ICD-10-CM | POA: Insufficient documentation

## 2015-06-14 DIAGNOSIS — K449 Diaphragmatic hernia without obstruction or gangrene: Secondary | ICD-10-CM | POA: Insufficient documentation

## 2015-06-14 MED ORDER — BARIUM SULFATE 98 % PO SUSR
100.0000 mL | Freq: Once | ORAL | Status: AC | PRN
Start: 2015-06-14 — End: 2015-06-14
  Administered 2015-06-14: 100 mL via ORAL

## 2015-06-14 MED ORDER — BARIUM SULFATE 96 % PO SUSR
100.0000 mL | Freq: Once | ORAL | Status: AC | PRN
Start: 2015-06-14 — End: 2015-06-14
  Administered 2015-06-14: 100 mL via ORAL

## 2015-06-14 MED ORDER — SOD BICARB-CITRIC AC-SIMETH 2.21-1.53-0.04 G PO PACK
1.0000 | PACK | Freq: Once | ORAL | Status: AC | PRN
Start: 2015-06-14 — End: 2015-06-14
  Administered 2015-06-14: 1 via ORAL

## 2016-06-24 ENCOUNTER — Ambulatory Visit (INDEPENDENT_AMBULATORY_CARE_PROVIDER_SITE_OTHER): Payer: Medicare Other | Admitting: Physician Assistant

## 2016-06-24 ENCOUNTER — Encounter (INDEPENDENT_AMBULATORY_CARE_PROVIDER_SITE_OTHER): Payer: Self-pay

## 2016-06-24 ENCOUNTER — Emergency Department: Payer: Medicare Other

## 2016-06-24 ENCOUNTER — Observation Stay
Admission: EM | Admit: 2016-06-24 | Discharge: 2016-06-25 | Disposition: A | Discharge status: Home / Self Care | Payer: Medicare Other | Attending: Internal Medicine | Admitting: Internal Medicine

## 2016-06-24 VITALS — BP 181/76 | HR 71 | Temp 98.2°F | Resp 22 | Ht 63.0 in | Wt 212.0 lb

## 2016-06-24 DIAGNOSIS — I110 Hypertensive heart disease with heart failure: Secondary | ICD-10-CM | POA: Insufficient documentation

## 2016-06-24 DIAGNOSIS — Z90710 Acquired absence of both cervix and uterus: Secondary | ICD-10-CM | POA: Insufficient documentation

## 2016-06-24 DIAGNOSIS — R079 Chest pain, unspecified: Secondary | ICD-10-CM

## 2016-06-24 DIAGNOSIS — I5032 Chronic diastolic (congestive) heart failure: Secondary | ICD-10-CM | POA: Insufficient documentation

## 2016-06-24 DIAGNOSIS — R0789 Other chest pain: Principal | ICD-10-CM | POA: Insufficient documentation

## 2016-06-24 DIAGNOSIS — Z885 Allergy status to narcotic agent status: Secondary | ICD-10-CM | POA: Insufficient documentation

## 2016-06-24 DIAGNOSIS — Z880 Allergy status to penicillin: Secondary | ICD-10-CM | POA: Insufficient documentation

## 2016-06-24 DIAGNOSIS — G629 Polyneuropathy, unspecified: Secondary | ICD-10-CM | POA: Insufficient documentation

## 2016-06-24 DIAGNOSIS — I482 Chronic atrial fibrillation, unspecified: Secondary | ICD-10-CM

## 2016-06-24 DIAGNOSIS — I739 Peripheral vascular disease, unspecified: Secondary | ICD-10-CM | POA: Insufficient documentation

## 2016-06-24 DIAGNOSIS — E039 Hypothyroidism, unspecified: Secondary | ICD-10-CM | POA: Insufficient documentation

## 2016-06-24 DIAGNOSIS — K449 Diaphragmatic hernia without obstruction or gangrene: Secondary | ICD-10-CM | POA: Insufficient documentation

## 2016-06-24 DIAGNOSIS — J302 Other seasonal allergic rhinitis: Secondary | ICD-10-CM | POA: Insufficient documentation

## 2016-06-24 DIAGNOSIS — R7989 Other specified abnormal findings of blood chemistry: Secondary | ICD-10-CM

## 2016-06-24 DIAGNOSIS — R002 Palpitations: Secondary | ICD-10-CM

## 2016-06-24 DIAGNOSIS — R778 Other specified abnormalities of plasma proteins: Secondary | ICD-10-CM

## 2016-06-24 DIAGNOSIS — K219 Gastro-esophageal reflux disease without esophagitis: Secondary | ICD-10-CM | POA: Insufficient documentation

## 2016-06-24 DIAGNOSIS — Z86711 Personal history of pulmonary embolism: Secondary | ICD-10-CM | POA: Insufficient documentation

## 2016-06-24 DIAGNOSIS — I493 Ventricular premature depolarization: Secondary | ICD-10-CM

## 2016-06-24 DIAGNOSIS — R0602 Shortness of breath: Secondary | ICD-10-CM

## 2016-06-24 DIAGNOSIS — M1990 Unspecified osteoarthritis, unspecified site: Secondary | ICD-10-CM | POA: Insufficient documentation

## 2016-06-24 DIAGNOSIS — Z7901 Long term (current) use of anticoagulants: Secondary | ICD-10-CM | POA: Insufficient documentation

## 2016-06-24 DIAGNOSIS — Z882 Allergy status to sulfonamides status: Secondary | ICD-10-CM | POA: Insufficient documentation

## 2016-06-24 HISTORY — DX: Unspecified atrial fibrillation: I48.91

## 2016-06-24 HISTORY — DX: Diaphragmatic hernia without obstruction or gangrene: K44.9

## 2016-06-24 LAB — CBC
Hematocrit: 44.2 % (ref 36.0–48.0)
Hemoglobin: 14.6 gm/dL (ref 12.0–16.0)
MCH: 31 pg (ref 28–35)
MCHC: 33 gm/dL (ref 32–36)
MCV: 94 fL (ref 80–100)
MPV: 8.1 fL (ref 6.0–10.0)
PLT CT: 201 10*3/uL (ref 130–440)
RBC: 4.68 10*6/uL (ref 3.80–5.00)
RDW: 13.5 % (ref 11.0–14.0)
WBC: 8 10*3/uL (ref 4.0–11.0)

## 2016-06-24 LAB — CBC AND DIFFERENTIAL
Basophils %: 0.4 % (ref 0.0–3.0)
Basophils Absolute: 0 10*3/uL (ref 0.0–0.3)
Eosinophils %: 1.6 % (ref 0.0–7.0)
Eosinophils Absolute: 0.1 10*3/uL (ref 0.0–0.8)
Hematocrit: 44.2 % (ref 36.0–48.0)
Hemoglobin: 14.5 gm/dL (ref 12.0–16.0)
Lymphocytes Absolute: 1.5 10*3/uL (ref 0.6–5.1)
Lymphocytes: 19.6 % (ref 15.0–46.0)
MCH: 31 pg (ref 28–35)
MCHC: 33 gm/dL (ref 32–36)
MCV: 95 fL (ref 80–100)
MPV: 8.3 fL (ref 6.0–10.0)
Monocytes Absolute: 0.6 10*3/uL (ref 0.1–1.7)
Monocytes: 7.3 % (ref 3.0–15.0)
Neutrophils %: 71.1 % (ref 42.0–78.0)
Neutrophils Absolute: 5.4 10*3/uL (ref 1.7–8.6)
PLT CT: 199 10*3/uL (ref 130–440)
RBC: 4.65 10*6/uL (ref 3.80–5.00)
RDW: 13.4 % (ref 11.0–14.0)
WBC: 7.6 10*3/uL (ref 4.0–11.0)

## 2016-06-24 LAB — B-TYPE NATRIURETIC PEPTIDE: B-Natriuretic Peptide: 265.5 pg/mL — ABNORMAL HIGH (ref 0.0–100.0)

## 2016-06-24 LAB — BASIC METABOLIC PANEL
Anion Gap: 14.7 mMol/L (ref 7.0–18.0)
BUN / Creatinine Ratio: 7.5 Ratio — ABNORMAL LOW (ref 10.0–30.0)
BUN: 8 mg/dL (ref 7–22)
CO2: 27.5 mMol/L (ref 20.0–30.0)
Calcium: 9.9 mg/dL (ref 8.5–10.5)
Chloride: 100 mMol/L (ref 98–110)
Creatinine: 1.06 mg/dL (ref 0.60–1.20)
EGFR: 48 mL/min/{1.73_m2} — ABNORMAL LOW (ref 60–150)
Glucose: 102 mg/dL — ABNORMAL HIGH (ref 70–99)
Osmolality Calc: 274 mOsm/kg — ABNORMAL LOW (ref 275–300)
Potassium: 4.2 mMol/L (ref 3.5–5.3)
Sodium: 138 mMol/L (ref 136–147)

## 2016-06-24 LAB — PT AND APTT
PT INR: 2.9 — ABNORMAL HIGH (ref 0.5–1.3)
PT: 29.1 s — ABNORMAL HIGH (ref 9.5–11.5)
aPTT: 39.4 s — ABNORMAL HIGH (ref 24.0–34.0)

## 2016-06-24 LAB — TROPONIN I: Troponin I: 0.02 ng/mL (ref 0.00–0.02)

## 2016-06-24 MED ORDER — WARFARIN SODIUM 2 MG PO TABS
2.0000 mg | ORAL_TABLET | Freq: Every day | ORAL | Status: DC
Start: 2016-06-25 — End: 2016-06-25
  Administered 2016-06-25: 2 mg via ORAL
  Filled 2016-06-24: qty 1

## 2016-06-24 MED ORDER — VH WARFARIN THERAPY PLACEHOLDER
1.0000 | Status: DC
Start: 2016-06-24 — End: 2016-06-25

## 2016-06-24 MED ORDER — LEVOTHYROXINE SODIUM 88 MCG PO TABS
88.0000 ug | ORAL_TABLET | Freq: Every day | ORAL | Status: DC
Start: 2016-06-25 — End: 2016-06-25
  Administered 2016-06-25: 88 ug via ORAL
  Filled 2016-06-24 (×3): qty 1

## 2016-06-24 MED ORDER — FUROSEMIDE 10 MG/ML IJ SOLN
40.0000 mg | Freq: Once | INTRAMUSCULAR | Status: AC
Start: 2016-06-24 — End: 2016-06-24
  Administered 2016-06-24: 40 mg via INTRAVENOUS

## 2016-06-24 MED ORDER — ACETAMINOPHEN 160 MG/5ML PO SOLN
650.0000 mg | ORAL | Status: DC | PRN
Start: 2016-06-24 — End: 2016-06-25

## 2016-06-24 MED ORDER — ACETAMINOPHEN 650 MG RE SUPP
650.0000 mg | RECTAL | Status: DC | PRN
Start: 2016-06-24 — End: 2016-06-25

## 2016-06-24 MED ORDER — PANTOPRAZOLE SODIUM 40 MG PO TBEC
40.0000 mg | DELAYED_RELEASE_TABLET | Freq: Every day | ORAL | Status: DC
Start: 2016-06-25 — End: 2016-06-25
  Administered 2016-06-25: 40 mg via ORAL
  Filled 2016-06-24: qty 1

## 2016-06-24 MED ORDER — CLONIDINE HCL 0.1 MG PO TABS
0.1000 mg | ORAL_TABLET | Freq: Once | ORAL | Status: AC
Start: 2016-06-24 — End: 2016-06-24
  Administered 2016-06-24: 0.1 mg via ORAL

## 2016-06-24 MED ORDER — CLONIDINE HCL 0.1 MG PO TABS
ORAL_TABLET | ORAL | Status: AC
Start: 2016-06-24 — End: ?
  Filled 2016-06-24: qty 1

## 2016-06-24 MED ORDER — GABAPENTIN 300 MG PO CAPS
600.0000 mg | ORAL_CAPSULE | Freq: Two times a day (BID) | ORAL | Status: DC
Start: 2016-06-24 — End: 2016-06-25
  Administered 2016-06-24 – 2016-06-25 (×2): 600 mg via ORAL
  Filled 2016-06-24 (×4): qty 2

## 2016-06-24 MED ORDER — FUROSEMIDE 20 MG PO TABS
20.0000 mg | ORAL_TABLET | Freq: Every day | ORAL | Status: DC
Start: 2016-06-25 — End: 2016-06-25
  Filled 2016-06-24: qty 1

## 2016-06-24 MED ORDER — SODIUM CHLORIDE 0.9 % IJ SOLN
0.4000 mg | INTRAMUSCULAR | Status: DC | PRN
Start: 2016-06-24 — End: 2016-06-25

## 2016-06-24 MED ORDER — ENOXAPARIN SODIUM 40 MG/0.4ML SC SOLN
40.0000 mg | SUBCUTANEOUS | Status: DC
Start: 2016-06-24 — End: 2016-06-24

## 2016-06-24 MED ORDER — ACETAMINOPHEN 325 MG PO TABS
650.0000 mg | ORAL_TABLET | ORAL | Status: DC | PRN
Start: 2016-06-24 — End: 2016-06-25

## 2016-06-24 MED ORDER — FUROSEMIDE 10 MG/ML IJ SOLN
INTRAMUSCULAR | Status: AC
Start: 2016-06-24 — End: ?
  Filled 2016-06-24: qty 4

## 2016-06-24 MED ORDER — CLONIDINE HCL 0.1 MG PO TABS
0.1000 mg | ORAL_TABLET | Freq: Two times a day (BID) | ORAL | Status: DC
Start: 2016-06-24 — End: 2016-06-25
  Administered 2016-06-25: 0.1 mg via ORAL
  Filled 2016-06-24 (×3): qty 1

## 2016-06-24 MED ORDER — LOSARTAN POTASSIUM 50 MG PO TABS
100.0000 mg | ORAL_TABLET | Freq: Every day | ORAL | Status: DC
Start: 2016-06-25 — End: 2016-06-25
  Administered 2016-06-25: 100 mg via ORAL
  Filled 2016-06-24: qty 2

## 2016-06-24 MED ORDER — METOPROLOL TARTRATE 50 MG PO TABS
50.0000 mg | ORAL_TABLET | Freq: Two times a day (BID) | ORAL | Status: DC
Start: 2016-06-24 — End: 2016-06-25
  Administered 2016-06-24 – 2016-06-25 (×2): 50 mg via ORAL
  Filled 2016-06-24 (×4): qty 1

## 2016-06-24 NOTE — ED Provider Notes (Addendum)
Physician/Midlevel provider first contact with patient: 06/24/16 1809         EMERGENCY DEPARTMENT HISTORY AND PHYSICAL EXAM      Date Time: 06/24/16 6:30 PM  Patient Name: Barbara [name]  Attending Physician: Marvene Staff, MD    Assessment/Plan:   Chest pain  Acute on chronic CHF  Consulted hospitalist Dr Nolon Bussing who saw the patient and will admit      MDM   The patient presents with chest pain of unclear etiology but potentially from coronary artery disease.  Initial cardiac markers and EKG are nondiagnostic. Based upon the presentation the patient appears to be at low risk for PE, aortic dissection, pneumonia. The plan is to admit this patient for cardiac evaluation, observation and further testing, and I consulted the hospitalist for admission.      History of Presenting Illness:   History provided by:  Patient  Barbara [name] is a 82 y.o. female     Patient presents for chest pain.  Context: Patient states she was seen at [facility] today (10/17) and sent to this ED for L sided chest pressure.  She states she had 2-3 episodes of this chest pressure within the last week.  Patient also states "fluttering in her chest".  Denies any nausea or diaphoresis.  Denies any weakness or numbness in her upper or lower extremities.  Patient states SOB with exertion that has been worsening and has been dx with CHF in the past.  Denies any new cough or fevers.  No h/o MI.  H/o atrial fibrillation and is on Coumadin.  Patient had been admitted in the past for chest pain but nothing outstanding had been found.   Location: chest   Quality: pressure-like  Radiation: none  Severity: moderate  Duration: Onset today (10/17)  Associated signs/symptoms: SOB, palpitations  Exacerbating/mitigating factors: exertion increases SOB      Past Medical History:     Past Medical History:   Diagnosis Date   . A-fib    . Arthritis    . Congestive heart failure    . Disorder of thyroid    . Dysphagia    . Gastroesophageal reflux disease    .  Hernia, hiatal    . HTN (hypertension)    . Pulmonary embolism    . PVD (peripheral vascular disease)    . Seasonal allergic rhinitis        Past Surgical History:     Past Surgical History:   Procedure Laterality Date   . COLONOSCOPY     . HYSTERECTOMY     . left arm         Family History:     Family History   Problem Relation Age of Onset   . No known problems Mother    . No known problems Father        Social History:     Social History     Social History   . Marital status: Divorced     Spouse name: N/A   . Number of children: N/A   . Years of education: N/A     Social History Main Topics   . Smoking status: Never Smoker   . Smokeless tobacco: Never Used   . Alcohol use No   . Drug use: No   . Sexual activity: Not on file     Other Topics Concern   . Not on file     Social History Narrative   . No narrative on  file       Allergies:     Allergies   Allergen Reactions   . Codeine Other (See Comments)   . Erythromycin    . Iodine Other (See Comments)   . Penicillins Other (See Comments)   . Percocet [Oxycodone-Acetaminophen] Other (See Comments)   . Sulfa Antibiotics Other (See Comments)   . Tramadol Other (See Comments)   . Zithromax [Azithromycin] Other (See Comments)       Medications:     Previous Medications    CHOLECALCIFEROL (VITAMIN D PO)    Take 1 tablet by mouth 2 (two) times daily.    CLONIDINE (CATAPRES) 0.1 MG TABLET        FUROSEMIDE (LASIX) 20 MG TABLET        GABAPENTIN (NEURONTIN) 300 MG CAPSULE    Take 1 capsule (300 mg total) by mouth 3 (three) times daily.    LEVOTHYROXINE (SYNTHROID, LEVOTHROID) 88 MCG TABLET        LOSARTAN (COZAAR) 100 MG TABLET    Take 100 mg by mouth daily.    METOPROLOL (LOPRESSOR) 50 MG TABLET    Take 50 mg by mouth 2 (two) times daily.    NITROFURANTOIN (MACRODANTIN) 50 MG CAPSULE        PANTOPRAZOLE (PROTONIX) 40 MG TABLET    Take 40 mg by mouth daily.    WARFARIN (COUMADIN) 1 MG TABLET    Take 1 mg by mouth daily.Three days a week take 2. Four days a week take 1         WARFARIN (COUMADIN) 2 MG TABLET    Take 2 mg by mouth daily.        Review of Systems:   Constitutional:  No fever.  No chills    Eyes:  No blurred vision.    Ears:  No ear pain.    Nose:  No congestion.  No discharge    Throat:  No sore throat.  No difficulty swallowing.    Cardiovascular:  +Chest pressure.  +Palpitations.    Respiratory: No cough.  +Shortness of breath.    GI:  No abdominal pain.  No nausea.  No vomiting.  No diarrhea.    GU:  No dysuria.    Neurological:  No headache.  No weakness.    Musculoskeletal:  No pain.    Skin:  No rash.  No skin lesions.    Endocrine:  No weight change    Psychiatric:  No depression.  No anxiety.      All other systems reviewed and negative except as above, pertinent findings in HPI.      Physical Exam:   Blood pressure 157/75, pulse 75, temperature 97.9 F (36.6 C), temperature source Oral, resp. rate 21, height 1.6 m, weight 97.4 kg, SpO2 96 %.  Constitutional:  Vitals signs reviewed. Well-appearing.  Well-nourished. No acute distress.    Head:  Atraumatic, normocephalic    Eyes:  Pupils equal, round.  Conjunctiva no injection or erythema    Nose:  Mucous membranes moist.  No discharge.     Mouth & Throat:   No erythema.  No exudates     Neck:  Supple, non tender.  No cervical lymphadenopathy.    Respiratory:  Breath sounds have basilar rales.  No distress    Cardiovascular:  Heart regular rate and rhythm.  No murmurs/gallops/rubs.  Pulses +2 bilaterally    Abdomen:  Soft. Non-tender. No distension.    Back:  No CVA tenderness bilaterally  Extremities:  Full range of motion.  1+ edema of bilateral lower legs.  No cyanosis.  No deformity.    Skin:  Warm.  Dry.  No pallor.  No rashes.  No lesions.  No bruises    Neurological:  Alert. Oriented to person,place, time.  GCS 15.  No focal motor deficits.  Cranial Nerves II-XII intact.     Psychiatric:  Normal affect.  No anxiety.  No depression.  No agitation.      Lab Results     Labs Reviewed   BASIC METABOLIC PANEL  - Abnormal; Notable for the following:        Result Value    Glucose 102 (*)     BUN/Creatinine Ratio 7.5 (*)     EGFR 48 (*)     Osmolality Calc 274 (*)     All other components within normal limits   PT AND APTT - Abnormal; Notable for the following:     PT 29.1 (*)     PT INR 2.9 (*)     aPTT 39.4 (*)     All other components within normal limits   B-TYPE NATRIURETIC PEPTIDE - Abnormal; Notable for the following:     B-Natriuretic Peptide 265.5 (*)     All other components within normal limits   CBC AND DIFFERENTIAL   TROPONIN I   TROPONIN I       Radiology Results     XR Chest 2 Views   Final Result   Cardiomegaly and probable mild congestive failure or fluid overload.      ReadingStation:[email or web address]          Labs and Radiological Studies Reviewed    Final Impression     Final diagnoses:   Chest pain, unspecified type       Disposition     ED Disposition     ED Disposition Condition Date/Time Comment    Observation  Tue Jun 24, 2016 10:04 PM Admitting Physician: Astrid Drafts [[id number]]   Diagnosis: Chest pain [[id number]]   Estimated Length of Stay: < 2 midnights   Tentative Discharge Plan?: Home or Self Care [1]   Patient Class: Observation [104]            Follow-Up Provider   No follow-up provider specified.        ATTESTATIONS    The documentation recorded by my scribe, Mizrain del Leach, accurately reflects the services I personally performed and the decisions made by me.  Fletcher Ostermiller Ames Dura, MD                Ola Raap Ames Dura, MD         Marvene Staff, MD  06/24/16 2227       Marvene Staff, MD  06/24/16 2232

## 2016-06-24 NOTE — H&P (Signed)
HISTORY AND PHYSICAL - [facility]    Date Time: 06/24/16 10:39 PM  Patient Name: [name],[name]  Attending Physician: Barbara Staff, MD  Primary Care Physician: Piedad Climes, MD    CC:   Chief Complaint   Patient presents with   . Chest Pain   . Palpitations       Assessment and Plan                                                          [facility]     Active Problems:    Chest pain    Atypical chest pain  Rule out ACS  Admit to medical floor  Monitor on telemetry  Serial troponins  Treat chest pain with aspirin and nitroglycerin  Her last stress test was 2 years ago.  Will do stress test if troponins remain negative  Continue metoprolol and statins    Chronic atrial fibrillation-  Rate controlled, On anticoagulation with Coumadin monitor INR    CHF mild pulmonary edema -  Received a dose of IV Lasix in the emergency room,  Will monitor urine output, continue with her by mouth Lasix    Physical disability due to severe neuropathy in her legs-  Supportive care    Hypothyroidism- continue with levothyroxine      Hypertension- continue with losartan, blood pressures .uncontrolled  , IV hydralazine for his BP more than 170    dvt px on coumadin       History of Presenting Illness and ROS:                               Barbara [name] is a 82 y.o. female with PMHx of  atrialibrillation, CHF who presents to the hospital with discomfort in the mid sternal area.  Patient says she suddenly felt a pressure like sensation in the middle of the chest and felt mildly nauseated,    The discomfort was different from any heartburn or dyspepsia.   Patient reports She has swelling in her legs . She denied any significant dyspnea or palpitations.  In the emergency room  Patient was found to have high blood pressure, chest x-ray showed mild pulmonary edema.  patient was initially reluctant to be admitted but afterwards agreed to stay for just one day.        Past Medical  History:                                                            [facility]       Past Medical History:   Diagnosis Date   . A-fib    . Arthritis    . Congestive heart failure    . Disorder of thyroid    . Dysphagia    . Gastroesophageal reflux disease    . Hernia, hiatal    . HTN (hypertension)    . Pulmonary embolism    . PVD (peripheral vascular disease)    . Seasonal allergic  rhinitis        Past Surgical History:                                                            [facility]       Past Surgical History:   Procedure Laterality Date   . COLONOSCOPY     . HYSTERECTOMY     . left arm         Family History:                                                                         [facility]       Family History   Problem Relation Age of Onset   . No known problems Mother    . No known problems Father        Social History:                                                                         [facility]       History   Smoking Status   . Never Smoker   Smokeless Tobacco   . Never Used     History   Alcohol Use No     History   Drug Use No       Allergies:                                                                                   [facility]       Allergies   Allergen Reactions   . Codeine Other (See Comments)   . Erythromycin    . Iodine Other (See Comments)   . Penicillins Other (See Comments)   . Percocet [Oxycodone-Acetaminophen] Other (See Comments)   . Sulfa Antibiotics Other (See Comments)   . Tramadol Other (See Comments)   . Zithromax [Azithromycin] Other (See Comments)       Medications:                                                                             [facility]       Current/Home Medications  CHOLECALCIFEROL (VITAMIN D PO)    Take 1 tablet by mouth 2 (two) times daily.    CLONIDINE (CATAPRES) 0.1 MG TABLET        FUROSEMIDE (LASIX) 20 MG TABLET        GABAPENTIN (NEURONTIN) 300 MG CAPSULE    Take 1 capsule (300 mg total) by  mouth 3 (three) times daily.    LEVOTHYROXINE (SYNTHROID, LEVOTHROID) 88 MCG TABLET        LOSARTAN (COZAAR) 100 MG TABLET    Take 100 mg by mouth daily.    METOPROLOL (LOPRESSOR) 50 MG TABLET    Take 50 mg by mouth 2 (two) times daily.    NITROFURANTOIN (MACRODANTIN) 50 MG CAPSULE        PANTOPRAZOLE (PROTONIX) 40 MG TABLET    Take 40 mg by mouth daily.    WARFARIN (COUMADIN) 1 MG TABLET    Take 1 mg by mouth daily.Three days a week take 2. Four days a week take 1        WARFARIN (COUMADIN) 2 MG TABLET    Take 2 mg by mouth daily.        Review Of Systems:                                                               [facility]       Review of Systems   Constitutional: Negative for chills, diaphoresis, fever and weight loss.   HENT: Negative.    Eyes: Negative.    Respiratory: Negative for cough, hemoptysis, sputum production, shortness of breath and wheezing.    Cardiovascular: Positive for chest pain, palpitations and leg swelling. Negative for orthopnea, claudication and PND.   Gastrointestinal: Negative for abdominal pain, blood in stool, constipation, diarrhea, heartburn, melena, nausea and vomiting.   Genitourinary: Negative.    Musculoskeletal: Positive for back pain and neck pain.   Neurological: Negative.  Negative for weakness.   Psychiatric/Behavioral: Negative.          Physical Exam:                                                                         [facility]       Patient Vitals for the past 24 hrs:   BP Temp Temp src Pulse Resp SpO2 Height Weight   06/24/16 2216 157/75 - - 75 21 - - -   06/24/16 2215 - 97.9 F (36.6 C) Oral - - - - -   06/24/16 2201 152/76 - - 76 20 96 % - -   06/24/16 2146 (!) 154/96 - - 76 19 - - -   06/24/16 2046 165/72 - - 69 18 96 % - -   06/24/16 2031 163/79 - - 74 19 94 % - -   06/24/16 2002 162/89 - - 79 15 93 % - -   06/24/16 1947 196/82 - - 81 21 99 % - -   06/24/16 1932 177/81 - - 87 (!) 24 99 % - -  06/24/16 1916 (!) 212/198 - - 97 (!) 27 98 % -  -   06/24/16 1902 (!) 196/97 - - 89 19 98 % - -   06/24/16 1901 (!) 198/102 - - - - - - -   06/24/16 1855 199/80 - - 91 (!) 24 98 % - -   06/24/16 1753 (!) 188/97 - - 84 (!) 26 95 % - -   06/24/16 1747 (!) 188/97 98.3 F (36.8 C) Oral 87 22 96 % 1.6 m (5\' 3" ) 97.4 kg (214 lb 11.7 oz)     Body mass index is 38.04 kg/m.  No intake or output data in the 24 hours ending 06/24/16 2239    General: awake, alert, oriented x 3; no acute distress.obese  HEENT: perrla, eomi, sclera anicteric  oropharynx clear without lesions, mucous membranes moist  Glands: No cervical or axillary lymphadenopathy.  Neck: supple, no lymphadenopathy, no thyromegaly, no JVD, no carotid bruits  Cardiovascular: regular rate and rhythm, no rubs or gallops  Lungs: clear to auscultation bilaterally, without wheezing, rhonchi, or rales  Abdomen: soft, non-tender, non-distended; no palpable masses, no hepatosplenomegaly, normoactive bowel sounds, no rebound or guarding  Extremities: nonpitting edema in lower extremities bilaterally  Neuro: cranial nerves grossly intact, strength 5/5 in upper and lower extremities, sensation intact,   Psych: Normal affect, not depressed   Skin: no rashes or lesions noted    Labs and Imaging:                                                                   [facility]       Results     Procedure Component Value Units Date/Time    B-type Natriuretic Peptide [[id number]]  (Abnormal) Collected:  06/24/16 1751    Specimen:  Blood Updated:  06/24/16 2117     B-Natriuretic Peptide 265.5 (H) pg/mL     Troponin I (Stat Q4 x 3) [[id number]] Collected:  06/24/16 1751    Specimen:  Plasma Updated:  06/24/16 1834     Troponin I 0.02 ng/mL     Basic Metabolic Panel [[id number]]  (Abnormal) Collected:  06/24/16 1751    Specimen:  Plasma Updated:  06/24/16 1830     Sodium 138 mMol/L      Potassium 4.2 mMol/L      Chloride 100 mMol/L      CO2 27.5 mMol/L      Calcium 9.9 mg/dL      Glucose 784 (H) mg/dL      Creatinine 6.96 mg/dL       BUN 8 mg/dL      Anion Gap 29.5 mMol/L      BUN/Creatinine Ratio 7.5 (L) Ratio      EGFR 48 (L) mL/min/1.66m2      Osmolality Calc 274 (L) mOsm/kg     PT/APTT [[id number]]  (Abnormal) Collected:  06/24/16 1751    Specimen:  Blood Updated:  06/24/16 1815     PT 29.1 (H) sec      PT INR 2.9 (H)     aPTT 39.4 (H) sec     CBC and differential [[id number]] Collected:  06/24/16 1751    Specimen:  Blood from Blood Updated:  06/24/16 1812     WBC 7.6 K/cmm  RBC 4.65 M/cmm      Hemoglobin 14.5 gm/dL      Hematocrit 78.2 %      MCV 95 fL      MCH 31 pg      MCHC 33 gm/dL      RDW 95.6 %      PLT CT 199 K/cmm      MPV 8.3 fL      NEUTROPHIL % 71.1 %      Lymphocytes 19.6 %      Monocytes 7.3 %      Eosinophils % 1.6 %      Basophils % 0.4 %      Neutrophils Absolute 5.4 K/cmm      Lymphocytes Absolute 1.5 K/cmm      Monocytes Absolute 0.6 K/cmm      Eosinophils Absolute 0.1 K/cmm      BASO Absolute 0.0 K/cmm           Imaging: Xr Chest 2 Views    Result Date: 06/24/2016  Cardiomegaly and probable mild congestive failure or fluid overload. ReadingStation:[email or web address]    Signed by: Astrid Drafts, MD    Total time spent in counseling and/or coordination of care with other physicians, other health care professionals, patient and family is 70 minutes.     [facility]  [address]  [place], [id number]    HQ:[email or web address], Judith Blonder, MD

## 2016-06-24 NOTE — Patient Instructions (Addendum)
Understanding Atrial Fibrillation    An arrhythmia is any problem with the speed or pattern of the heartbeat. Atrial fibrillation (AFib) is a common type of arrhythmia. It causes fast, chaotic electrical signals in the atria. This leads to poor functioning of the heart. It also affects how much blood your heart can pump out to the body.  Afib may occur once in a while and go away on its own. Or it may continue for longer periods and need treatment.  AFib can lead to serious problems, such as stroke. Your healthcare provider will need to monitor and manage it.  What happens during atrial fibrillation?  The heart has an electrical system that sends signals to control the heartbeat. As signals move through the heart, they tell the heart's upper chambers (atria) and lower chambers (ventricles) when to squeeze (contract) and relax. This lets blood move through the heart and out to the body and lungs.  With AFib, the atria receive abnormal signals. This causes them to contract in a fast and irregular way, and out of sync with the ventricles. When this happens, the atria also have a harder time moving blood into the ventricles. Blood may then pool in the atria, which increases the risk for blood clots and stroke. The ventricles also may contract too quickly and irregularly. As a result, they may not pump blood to the body and lungs as well as they should. This can weaken the heart muscle over time and cause heart failure.  What causes atrial fibrillation?  AFib is more common in older adults. It has many possible causes including:   Coronary artery disease   Heart valve disease   Heart attack   Heart surgery   High blood pressure   Thyroid disease   Diabetes   Lung disease   Sleep apnea   Heavy alcohol use  In some cases of AFib, doctors do not know the cause.  What are the symptoms of atrial fibrillation?  AFib may or may not cause symptoms. If symptoms do occur, they may include:   A fast, pounding,  irregular heartbeat   Shortness of breath   Tiredness   Dizziness or fainting   Chest pain  How is atrial fibrillation treated?  Treatments for AFib can include any of the options below.   Medicines. You may be prescribed:   Heart rate medicines to help slow down the heartbeat   Heart rhythm medicines to help the heart beat more regularly   Anti-clotting medicines to help reduce the risk for blood clots and stroke   Electrical cardioversion. Your healthcare provider uses special pads or paddles to send one or more brief electrical shocks to the heart. This can help reset the heartbeat to normal.   Ablation. Long, thin tubes called catheters are threaded through a blood vessel to the heart. There, the catheters send out hot or cold energy to the areas causing the abnormal signals. This energy destroys the problem tissue or cells. This improves the chances that your heart will stay in normal rhythm without using medicines. If your heart rate and rhythm can't be controlled, you may need ablation and a pacemaker. These will help control the heart rate and regularity of the heartbeat.   Surgery. During surgery, your healthcare provider may use different methods to create scar tissue in the areas of the heart causing the abnormal signals. The scar tissue disrupts the abnormal signals and may stop AFib from occurring.  What are the complications of atrial   fibrillation?  These can include:   Blood clots   Stroke   Heart failure. This problem occurs when the heart muscle weakens so much that it can no longer pump blood well.  When should I call my healthcare provider?  Call your healthcare provider right away if you have any of these:   Symptoms that don't get better with treatment, or get worse   New symptoms   Date Last Reviewed: 01/07/2015   2000-2016 [employer] [address], [place] [address]. All rights reserved. This information is not intended as a substitute for professional  medical care. Always follow your healthcare professional's instructions.

## 2016-06-24 NOTE — ED Notes (Signed)
Dr. Bradly Bienenstock aware of Pts BP 198/102. See new orders.

## 2016-06-24 NOTE — ED Triage Notes (Signed)
Pt sent here from urgent care for chest pain, SOB, palpitations, and dizziness. Pt states she has had heaviness in her chest for the past month. She states she has had SOB for the past couple of months with exertion. Today she states she had 2-3 spells of dizziness. Pt did not fall. Denies headache or dizziness at this time. Pt has a history of PE and CHF and takes coumadin.

## 2016-06-24 NOTE — ED Notes (Signed)
Bed: N6-A  Expected date:   Expected time:   Means of arrival:   Comments:  EMS

## 2016-06-24 NOTE — ED Notes (Signed)
Pt updated on plan of care. No changes from most recent assessment. Pt denies any needs at this time. Pt given 40mg  Lasix IV. Pt resting comfortably at this time. Ready for transport

## 2016-06-24 NOTE — ED Notes (Signed)
RN spoke to pt and son about staying in the hospital. Pt willing to stay now. Admitting MD informed of this

## 2016-06-24 NOTE — Progress Notes (Signed)
Subjective:    Patient ID: Barbara [name] is a 82 y.o. female.    Chest Pain    This is a recurrent problem. Episode onset: 1 mo, heaviness worsened today. The onset quality is gradual. The problem occurs constantly. The problem has been waxing and waning. The pain is present in the substernal region. The pain is at a severity of 8/10. The pain is severe. The quality of the pain is described as heavy and pressure. Associated symptoms include leg pain, palpitations and weakness. Pertinent negatives include no cough, fever, shortness of breath or vomiting.   Her past medical history is significant for arrhythmia, CHF and PE.   Pertinent negatives for past medical history include no MI.       The following portions of the patient's history were reviewed and updated as appropriate: allergies, current medications, past family history, past medical history, past social history, past surgical history and problem list.    Review of Systems   Constitutional: Negative for fever.   Respiratory: Negative for cough and shortness of breath.    Cardiovascular: Positive for chest pain and palpitations.   Gastrointestinal: Negative for vomiting.   Skin: Negative for rash and wound.   Neurological: Positive for weakness.         Objective:    BP 181/76 (BP Site: Right arm, Patient Position: Sitting, Cuff Size: Large)   Pulse 71   Temp 98.2 F (36.8 C) (Oral)   Resp 22   Ht 1.6 m (5\' 3" )   Wt 96.2 kg (212 lb)   SpO2 97%   BMI 37.55 kg/m     Physical Exam   Constitutional: She appears well-developed and well-nourished. No distress.   HENT:   Head: Normocephalic.   Right Ear: External ear normal.   Left Ear: External ear normal.   Mouth/Throat: Oropharynx is clear and moist.   Eyes: Pupils are equal, round, and reactive to light.   Neck: Normal range of motion. Neck supple.   Cardiovascular: Normal rate and normal heart sounds.    No murmur heard.  Pulmonary/Chest: Effort normal and breath sounds normal. No stridor. No  respiratory distress. She has no wheezes. She has no rales.   Musculoskeletal: Normal range of motion.   Lymphadenopathy:     She has no cervical adenopathy.   Neurological: She is alert.   Skin: Skin is warm and dry. No rash noted. She is not diaphoretic. No erythema. No pallor.   Psychiatric: She has a normal mood and affect. Her behavior is normal. Thought content normal.   Nursing note and vitals reviewed.        Assessment and Plan:       Barbara [name] was seen today for chest pain.    Diagnoses and all orders for this visit:    Chest pain, unspecified type  -     ECG 12 lead    Chronic atrial fibrillation    PVC (premature ventricular contraction)     CHANGES IN EKG, NEW HEAVINESS IN CHEST.  NEEDS FURTHER EVALUATIONEmergency Department Transfer    Pt transferred to: [facility]    Reason for transfer: Chest Pain  Pertinent studies performed: see progress note below  Mode of transportation: 911    Spoke to: EMT at receiving facility    Pt expressed understanding of the reason for transfer to the ED, and agreed to go directly there.            Joyce Gross, PA  [facility]  06/24/2016  5:02 PM

## 2016-06-25 ENCOUNTER — Observation Stay: Payer: Medicare Other

## 2016-06-25 LAB — CBC AND DIFFERENTIAL
Basophils %: 0.6 % (ref 0.0–3.0)
Basophils Absolute: 0 10*3/uL (ref 0.0–0.3)
Eosinophils %: 2.1 % (ref 0.0–7.0)
Eosinophils Absolute: 0.1 10*3/uL (ref 0.0–0.8)
Hematocrit: 39.3 % (ref 36.0–48.0)
Hemoglobin: 13.1 gm/dL (ref 12.0–16.0)
Lymphocytes Absolute: 1.1 10*3/uL (ref 0.6–5.1)
Lymphocytes: 16.7 % (ref 15.0–46.0)
MCH: 31 pg (ref 28–35)
MCHC: 33 gm/dL (ref 32–36)
MCV: 94 fL (ref 80–100)
MPV: 7.7 fL (ref 6.0–10.0)
Monocytes Absolute: 0.7 10*3/uL (ref 0.1–1.7)
Monocytes: 10 % (ref 3.0–15.0)
Neutrophils %: 70.6 % (ref 42.0–78.0)
Neutrophils Absolute: 4.8 10*3/uL (ref 1.7–8.6)
PLT CT: 166 10*3/uL (ref 130–440)
RBC: 4.2 10*6/uL (ref 3.80–5.00)
RDW: 13.4 % (ref 11.0–14.0)
WBC: 6.7 10*3/uL (ref 4.0–11.0)

## 2016-06-25 LAB — COMPREHENSIVE METABOLIC PANEL
ALT: 8 U/L (ref 0–55)
AST (SGOT): 18 U/L (ref 10–42)
Albumin/Globulin Ratio: 0.82 Ratio (ref 0.70–1.50)
Albumin: 2.8 gm/dL — ABNORMAL LOW (ref 3.5–5.0)
Alkaline Phosphatase: 91 U/L (ref 40–145)
Anion Gap: 10.8 mMol/L (ref 7.0–18.0)
BUN / Creatinine Ratio: 8.3 Ratio — ABNORMAL LOW (ref 10.0–30.0)
BUN: 8 mg/dL (ref 7–22)
Bilirubin, Total: 1 mg/dL (ref 0.1–1.2)
CO2: 28.8 mMol/L (ref 20.0–30.0)
Calcium: 9 mg/dL (ref 8.5–10.5)
Chloride: 100 mMol/L (ref 98–110)
Creatinine: 0.96 mg/dL (ref 0.60–1.20)
EGFR: 54 mL/min/{1.73_m2} — ABNORMAL LOW (ref 60–150)
Globulin: 3.4 gm/dL (ref 2.0–4.0)
Glucose: 103 mg/dL — ABNORMAL HIGH (ref 70–99)
Osmolality Calc: 271 mOsm/kg — ABNORMAL LOW (ref 275–300)
Potassium: 3.6 mMol/L (ref 3.5–5.3)
Protein, Total: 6.2 gm/dL (ref 6.0–8.3)
Sodium: 136 mMol/L (ref 136–147)

## 2016-06-25 LAB — TROPONIN I
Troponin I: 0.02 ng/mL (ref 0.00–0.02)
Troponin I: 0.03 ng/mL — ABNORMAL HIGH (ref 0.00–0.02)

## 2016-06-25 LAB — PT/INR
PT INR: 2.6 — ABNORMAL HIGH (ref 0.5–1.3)
PT: 25.8 s — ABNORMAL HIGH (ref 9.5–11.5)

## 2016-06-25 LAB — CREATININE, SERUM
Creatinine: 1.03 mg/dL (ref 0.60–1.20)
EGFR: 49 mL/min/{1.73_m2} — ABNORMAL LOW (ref 60–150)

## 2016-06-25 MED ORDER — REGADENOSON 0.4 MG/5ML IV SOLN
0.4000 mg | Freq: Once | INTRAVENOUS | Status: AC
Start: 2016-06-25 — End: 2016-06-25
  Administered 2016-06-25: 0.4 mg via INTRAVENOUS
  Filled 2016-06-25: qty 5

## 2016-06-25 MED ORDER — TECHNETIUM TC 99M SESTAMIBI - CARDIOLITE
11.0000 | Freq: Once | Status: AC | PRN
Start: 2016-06-25 — End: 2016-06-25
  Administered 2016-06-25: 11 via INTRAVENOUS

## 2016-06-25 MED ORDER — TECHNETIUM TC 99M SESTAMIBI - CARDIOLITE
33.0000 | Freq: Once | Status: AC | PRN
Start: 2016-06-25 — End: 2016-06-25
  Administered 2016-06-25: 33 via INTRAVENOUS

## 2016-06-25 MED ORDER — CLONIDINE HCL 0.1 MG PO TABS
0.1000 mg | ORAL_TABLET | Freq: Two times a day (BID) | ORAL | Status: DC | PRN
Start: 2016-06-25 — End: 2016-06-25

## 2016-06-25 MED ORDER — REGADENOSON 0.4 MG/5ML IV SOLN
INTRAVENOUS | Status: AC
Start: 2016-06-25 — End: ?
  Filled 2016-06-25: qty 5

## 2016-06-25 NOTE — Progress Notes (Addendum)
Assumed care of patient.  Patient is alert, resting in bed with call bell within reach and has some complaints of lower left leg pain at this time.  Bilateral lower calves are firm at this time. Pulses are weak .   Bed is in the lowest position with wheels locked. Tele and IV are intact.  Whiteboard is updated.    9:38 AM  Dr. Wynn Banker is aware of patient's complaint of leg pain, hardness of BLE, and therapeutice INR 2.6.  No new orders.      11:12 AM   Per Dr. Wynn Banker:  Hold metoprolol for Stress test.  Do not give morning dose of Clonidine.  OK to give all meds after ST b/c patient cannot take meds without drinking a lot of water.  BP is WDL.    Patient off to ST.    3:31 PM  Patient back from Stress test.  Vitals are being taken.      3:34 PM    Per Dr. Wynn Banker:  Ok to change diet back to cardiac.      4:06 PM  Per Dr. Wynn Banker:  Change clonidine order to PRN for SBP> 160    4:58 PM  Patient states that is ok to share information with her daughter in law, Aggie Cosier.

## 2016-06-25 NOTE — UM Notes (Signed)
ED Treatment   Lasix 40 mg x1 IV  Admission Review  History   a 82 y.o. female with PMHx of  atrialibrillation, CHF who presents to the hospital with discomfort in the mid sternal area.  Patient says she suddenly felt a pressure like sensation in the middle of the chest and felt mildly nauseated,    The discomfort was different from any heartburn or dyspepsia.Per H&P  VS  188/97, 98.3, 87, 22, 96%  Labs  bnp 265, INR 2.9,   Imagining   Chest xray  Cardiomegaly and probable mild congestive failure or fluid overload  Assessment/Plan  Atypical chest pain  Rule out ACS  Admit to medical floor  Monitor on telemetry  Serial troponins  Treat chest pain with aspirin and nitroglycerin  Her last stress test was 2 years ago.  Will do stress test if troponins remain negative  Continue metoprolol and statins  Admission Orders  Tele, CE, stress test

## 2016-06-25 NOTE — Progress Notes (Signed)
10/18 RNCM: Chart reviewed. Admitted with chest pain. CHF.  Afib. History of PE. Trops negative. Stress test today. Pt was unable to do stress test on treadmill. IV lasix changed to PO lasix. Daughter in law assist in pts care - daughter in law can be reached at [phone]. Pts POA lives in [place]. Pt has been out of room during CM screens. CM to screen pt in am. CM following.

## 2016-06-25 NOTE — Progress Notes (Addendum)
12:02 AM   Pt to 3E. Pt is in bed resting at this time. SBP elevated, scheduled metoprolol given. Will reassess.     12:23 AM   Spoke with Dr Nolon Bussing regarding pt's clonidine due now. Pt received dose in ED. MD says that since pt's BP has been running high, we can give additional dose. MD says we are holding off on coumadin until tomorrow since INR is 2.7.    1:07 AM   Spoke with MD regarding pt's legs with redness and hot to the touch. MD says she does not think pt has a DVT at this time since INR is 2.7 and pt is on coumadin. MD gave no new orders at this time.     6:17 AM   Pt is in bed resting at this time. Bed in low position and call bell within reach. Will pass report to oncoming RN.

## 2016-06-25 NOTE — Plan of Care (Signed)
Problem: Moderate/High Fall Risk Score >5  Goal: Patient will remain free of falls  Outcome: Progressing  Patient will remain free from falls during hospitalization.  Patient has been instructed on appropriate use of call bell.  Fall precautions are in place including: yellow fall slippers and fall risk sign-age.   Bed alarm is on when patient is alone in room.

## 2016-06-25 NOTE — Plan of Care (Signed)
Problem: Hemodynamic Status: Cardiac  Goal: Stable vital signs and fluid balance  Outcome: Progressing   06/25/16 0110   Goal/Interventions addressed this shift   Stable vital signs and fluid balance Monitor/assess vital signs and telemetry per unit protocol;Weigh on admission and record weight daily;Assess signs and symptoms associated with cardiac rhythm changes;Monitor lab values;Monitor for leg swelling/edema and report to LIP if abnormal;Monitor intake/output per unit protocol and/or LIP order

## 2016-06-25 NOTE — Progress Notes (Signed)
Nuclear stress test; patient administered 0.4 mg Lexiscan IV over 20 seconds.  No noted ST changes.  Noted A fib at rest.  Patient was asymptomatic.  Awaiting sestamibi images.      [place] Icis Budreau, [place] Exercise Physiologist  [phone]

## 2016-06-25 NOTE — Progress Notes (Signed)
PROGRESS NOTE - [facility]    Date Time: 06/25/16 11:07 AM  Patient Name: BarbaraBarbara [name]  Attending Physician: Cyndra Numbers, MD    Assessment/Plan:                                                                            [facility]   Chest pain , intermittent: feeling better now, troponin level not significant. Currently better.     -stress test pending     Peripheral severe  Neuropathy(bilateral leg ) : continue Gabapentin         Chronic A fib : rate controlled, continue coumadin , INR 2.6     HTN; continue medication with holding parameter     Chronic diastolic CHF :continue Lasix po, Received IV lasix in the ER last night           Case discussed with: patient and RN  Lines:                                                                                                    [facility]     Patient Lines/Drains/Airways Status    Active PICC Line / CVC Line / PIV Line / Drain / Airway / Intraosseous Line / Epidural Line / ART Line / Line / Wound / Pressure Ulcer / NG/OG Tube     Name:   Placement date:   Placement time:   Site:   Days:    Peripheral IV 06/24/16 Left Antecubital  06/24/16    1755    Antecubital    less than 1                Disposition:                                                                                          [facility]     Today's date: 06/25/2016  Length of Stay: 0    Anticipated medical stability for discharge in : 1 days    Subjective  [facility]     CC: chest pain    Interval History/24 hour events: no pain    HPI/Subjective:  Leg pain       Review of Systems:                                                                             [facility]     Review of Systems   Constitutional: Negative for chills, diaphoresis, fever, malaise/fatigue and weight loss.   HENT: Negative for congestion, ear pain, hearing loss, nosebleeds and sore  throat.    Eyes: Negative for double vision, photophobia and pain.   Respiratory: Negative for cough, hemoptysis, sputum production and shortness of breath.    Cardiovascular: Positive for chest pain. Negative for palpitations, orthopnea, leg swelling and PND.   Gastrointestinal: Negative for abdominal pain, blood in stool, constipation, diarrhea, heartburn, nausea and vomiting.   Genitourinary: Negative for dysuria, frequency, hematuria and urgency.   Musculoskeletal: Negative for myalgias and neck pain.        Neuropathic pain LE   Neurological: Negative for dizziness, tingling, tremors, weakness and headaches.   Endo/Heme/Allergies: Negative for environmental allergies and polydipsia. Does not bruise/bleed easily.         Physical Exam:                                                                                     [facility]   Temp:  [97.8 F (36.6 C)-98.5 F (36.9 C)] 97.8 F (36.6 C)  Heart Rate:  [55-107] 60  Resp Rate:  [15-27] 18  BP: (103-212)/(55-198) 126/55    Intake/Output Summary (Last 24 hours) at 06/25/16 1107  Last data filed at 06/25/16 0435   Gross per 24 hour   Intake                0 ml   Output              450 ml   Net             -450 ml     HEENT: perrla, eomi, sclera anicteric  oropharynx clear without lesions, mucous membranes moist  Glands: No cervical or axillary lymphadenopathy.  Neck: supple, no lymphadenopathy, no thyromegaly, no JVD, no carotid bruits  Cardiovascular: regular rate and rhythm, no rubs or gallops  Lungs: clear to auscultation bilaterally, without wheezing, rhonchi, or rales  Abdomen: soft, non-tender, non-distended; no palpable masses, no hepatosplenomegaly, normoactive bowel sounds, no rebound or guarding  Extremities: nonpitting edema in lower extremities bilaterally, possible venous stasis, pulses are intact in the LE  Neuro: no focal neurologic deficit       Meds:  [facility]     Medications were reviewed in the electronic record.    Labs and Imaging:                                                                               [facility] (last 72 hours):      Recent Labs  Lab 06/25/16  0315 06/24/16  2339   WBC 6.7 8.0   Hemoglobin 13.1 14.6   Hematocrit 39.3 44.2   PLT CT 166 201         Recent Labs  Lab 06/25/16  0315 06/24/16  1751   PT 25.8* 29.1*   PT INR 2.6* 2.9*      Recent Labs  Lab 06/25/16  0315 06/24/16  2339 06/24/16  1751   Sodium 136  --  138   Potassium 3.6  --  4.2   Chloride 100  --  100   CO2 28.8  --  27.5   BUN 8  --  8   Creatinine 0.96 1.03 1.06   Calcium 9.0  --  9.9   Albumin 2.8*  --   --    Protein, Total 6.2  --   --    Bilirubin, Total 1.0  --   --    Alkaline Phosphatase 91  --   --    ALT 8  --   --    AST (SGOT) 18  --   --    Glucose 103*  --  102*                   Microbiology, reviewed and are significant for:  Microbiology Results     None          Imaging, reviewed and are significant for:    I have spent 35 minutes with the patient, discussing the plan of care, of which greater than 50% of the time was spent counseling and coordinating care.    Signed by: Cyndra Numbers, MD    [facility]  [address]  [place], [id number]    Note: This chart was generated by the Epic EMR system/speech recognition and may contain inherent errors or omissions not intended by the user. Grammatical errors, random word insertions, deletions, pronoun errors and incomplete sentences are occasional consequences of this technology due to software limitations. Not all errors are caught or corrected. If there are questions or concerns about the content of this note or information contained within the body of this dictation they should be addressed directly with the author for clarification

## 2016-06-25 NOTE — Progress Notes (Signed)
Pt unable to do treadmill. Lexiscan 0.4 mg IV administered per protocol.

## 2016-06-25 NOTE — Discharge Summary (Signed)
DISCHARGE SUMMARY - [facility]    Patient Name: Barbara [name],Barbara [name]  Attending Physician: Cyndra Numbers, MD  Primary Care Physician: Piedad Climes, MD    Date of Admission: 06/24/2016  Date of Discharge: 06/25/2016  Length of Stay in the Hospital: 0    Discharge Diagnoses:                                                                      [facility] Problems    Diagnosis POA   . Chest pain Yes      Stress test reported :   Risk Assessment: Low    Single day rest stress vasodilator sestamibi SPECT perfusion study.  There is a fixed apical perfusion defect which is likely  secondary to apical thinning/scar.  There is no inducible ischemia.    Normal left ventricular size and function. Post-rest ejection fraction 68    Past Medical History:   Diagnosis Date   . A-fib    . Arthritis    . Congestive heart failure    . Disorder of thyroid    . Dysphagia    . Gastroesophageal reflux disease    . Hernia, hiatal    . HTN (hypertension)    . Pulmonary embolism    . PVD (peripheral vascular disease)    . Seasonal allergic rhinitis          Discharge Condition: stable    Discharge Instructions:                                                                   [facility]      Diet:Heart healthy    Activity:as tolerated     Patient was instructed to follow up with:   Primary Care Doctor Piedad Climes, MD in  1 week & the following Consultants:  1. Cardiologist in 1 week, Dr.plitt     Discharge Code Status: Full Code    Complete instructions and follow up are in the patient's After Visit Summary (AVS).    Discharge Medications:                                                                    [facility]        Discharge Medication List      Taking    acyclovir 5 % ointment  Commonly known as:  ZOVIRAX  Apply topically as needed.     cloNIDine 0.1 MG tablet  Dose:  0.1 mg  Commonly known as:  CATAPRES  For:  High Blood Pressure Disorder  0.1 mg as needed (high  blood pressure).     furosemide 20 MG tablet  Dose:  20 mg  Commonly known as:  LASIX  20 mg daily.     gabapentin 300 MG capsule  Dose:  300 mg  What changed:   how much to take   when to take this  Commonly known as:  NEURONTIN  Take 1 capsule (300 mg total) by mouth 3 (three) times daily.     levothyroxine 88 MCG tablet  Dose:  88 mcg  Commonly known as:  SYNTHROID, LEVOTHROID  88 mcg daily.     losartan 100 MG tablet  Dose:  100 mg  Commonly known as:  COZAAR  Take 100 mg by mouth daily.     metoprolol tartrate 50 MG tablet  Dose:  50 mg  Commonly known as:  LOPRESSOR  Take 50 mg by mouth 2 (two) times daily.     pantoprazole 40 MG tablet  Dose:  40 mg  Commonly known as:  PROTONIX  Take 40 mg by mouth daily.     VITAMIN D PO  Dose:  1 tablet  Take 1 tablet by mouth 2 (two) times daily.     * warfarin 1 MG tablet  Dose:  1 mg  Commonly known as:  COUMADIN  For:  Blood Clot in a Deep Vein, Obstructive Blood Clot in a Blood Vessel of the Lung, except tues and thurs  Take 1 mg by mouth daily.Three days a week take 2. Four days a week take 1     * warfarin 2 MG tablet  Dose:  2 mg  Commonly known as:  COUMADIN  For:  Every tues and thursday  Take 2 mg by mouth daily.        * This list has 2 medication(s) that are the same as other medications prescribed for you. Read the directions carefully, and ask your doctor or other care provider to review them with you.            STOP taking these medications    raNITIdine 150 MG tablet  Commonly known as:  ZANTAC            Brief history/Hospital course:                                                          [facility]    82 y.o. female with PMHx of  atrialibrillation, CHF who presents to the hospital with discomfort in the mid sternal area. Troponin X 3 done , 0.02-0.03 and stress test done reported :Risk Assessment: Low    Single day rest stress vasodilator sestamibi SPECT perfusion study.  There is a fixed apical perfusion defect which is likely secondary to  apical thinning/scar.  There is no inducible ischemia.    Normal left ventricular size and function. Post-rest ejection fraction 68.  Chest pain resolved , patient asked to be discharged and patient asked to follow with cardiologist as out patient.   Denies nausea or vomiting and no chest discomfort   (See full History and Physical for details.)    Consultations:  [facility]     Treatment Team: Attending Provider: Cyndra Numbers, MD; Consulting Physician: Astrid Drafts, MD; Technician: Madelynn Done, CNA; Registered Nurse: Glenice Laine, RN; Case Manager: Allison-McCloud, Vernona Rieger, RN; Registered Nurse: Lolita Rieger, RN    Procedures/Radiology performed:                                                 [facility]     XR CHEST 2 VIEWS  NM MYOCARDIAL PERFUSION SPECT (STRESS AND REST)  CV CARDIAC STRESS TEST TRACING ONLY      No orders of the defined types were placed in this encounter.         Discharge Day Exam:  Temp:  [97.8 F (36.6 C)-98.5 F (36.9 C)] 97.8 F (36.6 C)  Heart Rate:  [55-107] 66  Resp Rate:  [15-27] 20  BP: (103-212)/(55-198) 159/69  Wt Readings from Last 3 Encounters:   06/24/16 99.8 kg (220 lb)   06/24/16 96.2 kg (212 lb)   06/06/15 99.3 kg (219 lb)       General: awake, alert, oriented x 3; no acute distress.  Cardiovascular: regular rate and rhythm, no murmurs.  Lungs: clear to auscultation bilaterally, without wheezing, rhonchi, or rales  Abdomen: soft, non-tender, non-distended; no palpable masses, no hepatosplenomegaly, normoactive bowel sounds, no rebound or guarding  Extremities: no clubbing, cyanosis, or edema        Discharge Condition:                                                                        [facility]     The patient was discharged in stable condition.  Time spent coordinating discharge and reviewing discharge plan: 30  minutes      Signed by:  Cyndra Numbers, MD    [facility]  [address]  [place], [place]    CC: Piedad Climes, MD

## 2016-06-25 NOTE — Discharge Instr - AVS First Page (Signed)
Follow up with primary care provider in 1 week    Follow up with cardiologist in 1 week     Check Pt/INR Friday 10/20

## 2016-06-25 NOTE — Discharge Instructions (Signed)
Noncardiac Chest Pain    Based on your visit today, the health care provider doesn't know what is causing your chest pain. In most cases, people who come to the emergency department with chest pain don't have a problem with their heart. Instead, the pain is caused by other conditions. These may be problems with the lungs, muscles, bones, digestive tract, nerves, or mental health.  Lung problems   Inflammation around the lungs (pleurisy)   Collapsed lung (pneumothorax)   Fluid around the lungs (pleural effusion)   Lung cancer. This is a rare cause of chest pain.  Muscle or bone problems   Inflamed cartilage between the ribs (pleurisy)   Fibromyalgia   Rheumatoid arthritis  Digestive system problems   Reflux   Stomach ulcer   Spasms of the esophagus   Gall stones   Gallbladder inflammation  Mental health conditions   Panic or anxiety attacks   Emotional distress  Your condition doesn't seem serious and your pain doesn't appear to be coming from your heart. But sometimes the signs of a serious problem take more time to appear. Watch for the warning signs listed below.  Home care  Follow these guidelines when caring for yourself at home:   Rest today and avoid strenuous activity.   Take any prescribed medicine as directed.  Follow-up care  Follow up with your health care provider, or as advised, if you don't start to feel better within 24 hours.  When to seek medical advice  Call your health care provider right away if any of these occur:   A change in the type of pain. Call if it feels different, becomes more serious, lasts longer, or begins to spread into your shoulder, arm, neck, jaw, or back.   Shortness of breath   You feel more pain when you breathe   Cough with dark-colored mucus or blood   Weakness, dizziness, or fainting   Fever of 100.4F (38C) or higher, or as directed by your health care provider   Swelling, pain, or redness in one leg  Date Last Reviewed: 08/01/2013   2000-2016  [employer] [address], [place] [address]. All rights reserved. This information is not intended as a substitute for professional medical care. Always follow your healthcare professional's instructions.

## 2016-06-26 LAB — ECG 12-LEAD
Patient Age: 88 years
Patient Age: 88 years
Q-T Interval(Corrected): 440 ms
Q-T Interval(Corrected): 451 ms
Q-T Interval: 374 ms
Q-T Interval: 480 ms
QRS Axis: -11 deg
QRS Axis: -22 deg
QRS Duration: 122 ms
QRS Duration: 98 ms
T Axis: 12 years
T Axis: 1 years
Ventricular Rate: 83 //min
Ventricular Rate: 53 //min

## 2016-06-27 ENCOUNTER — Telehealth (INDEPENDENT_AMBULATORY_CARE_PROVIDER_SITE_OTHER): Payer: Self-pay

## 2016-06-27 NOTE — Telephone Encounter (Signed)
Patient is feeling better.

## 2016-08-22 ENCOUNTER — Encounter

## 2016-08-22 ENCOUNTER — Encounter (INDEPENDENT_AMBULATORY_CARE_PROVIDER_SITE_OTHER): Payer: Self-pay | Admitting: Cardiovascular Disease

## 2016-08-22 NOTE — Progress Notes (Deleted)
[facility] [place]  [address]  [address]  [place] [id number]  [phone]    Date: 08/22/2016  Patient Name: Kathryn HighlandHattie Reyes  MRN#: [id number]  DOB: 05/29/1928    Provider: Lenda Kelpavid C Plitt, MD  PCP: No primary care provider on file.    History:   Kathryn Reyes is a 82 y.o. female here to establish care. [facility] due to chest pain. Troponins were negative, nuclear stress test was done and was low risk.  She was discharged the same day. She has a known h/o Afib, on coumadin, diastolic CHF, PE, and ?PAD. Today she     Past Medical History:     Past Medical History:   Diagnosis Date    Arthritis     Atrial fibrillation (HCC)     Congestive heart failure (CHF) (HCC)     Disorder of thyroid     Dysphagia     Esophageal reflux     Hernia, hiatal     Hypertension     Pulmonary embolism (HCC)     PVD (peripheral vascular disease) (HCC)     Seasonal allergic rhinitis          Past Surgical History:     Past Surgical History:   Procedure Laterality Date    COLONOSCOPY      HX HYSTERECTOMY           Allergies:   Allergies not on file    Medications:     No current outpatient prescriptions on file.       Family History:     Family Medical History     None              Social History:     Social History     Social History    Marital status: N/A     Spouse name: N/A    Number of children: N/A    Years of education: N/A     Social History Main Topics    Smoking status: Not on file    Smokeless tobacco: Not on file    Alcohol use Not on file    Drug use: Not on file    Sexual activity: Not on file     Other Topics Concern    Not on file     Social History Narrative    No narrative on file       Review of Systems:     All systems were reviewed and are negative other than noted in the HPI.    Physical Exam:   There were no vitals filed for this visit.  There is no height or weight on file to calculate BMI.    General: No acute distress  HEENT: Moist mucous  membranes  Neck: Supple, no JVD, no bruits  Heart/Cardiovascular:  RRR  No murmurs, rubs, clicks, or gallops  2+ radial pulses  2+ femoral pulses  2+ DP pulses  2+ PT pulses  Respiratory/Lungs: Non-labored, clear to auscultation  Abdominal/Gastrointestinal: Soft, non-tender  Extremities: Warm and well-perfused, no edema x4  Skin: No rash  Neurological: Alert and oriented x3, grossly nonfocal  Psych: Calm, no affect    Cardiovascular Workup:     ABI 11/21/04  Normal lower extremity ABIs with atherosclerotic plaque noted without hemodynamically significant stenosis    VQ scan 05/25/09  Low probability scan for emboli.    Echo 06/12/13  1.  Mild LVH with  preserved systolic function.  EF 65% or  greater without wall motion abnormalities or thinning.  Mild decreased compliance.  2.  Left atrium is top normal in size to mildly dilated.  3.  The right ventricle is normal size, normal function.  4.  The right atrium is normal in size.  5.  Interatrial septum is unremarkable.  No defects.  No masses.  6.  Aorta:  Ascending aorta is normal.  7.  Aortic valve is trileaflet.  There is mild sclerosis of the valve.  There is no significant stenosis.  There is      trace AI.  8.  Mitral valve normal morphology and function.  There is      no MS. there is trace MR.  9.  Tricuspid valve normal morphology with mild tricuspid insufficiency.  Estimated RV systolic pressure about 30 to 35 mmHg consistent with mild pulmonary hypertension.  10.  Pulmonic valve normal function.  11.  Pericardium normal    Nuclear stress test 08/25/14  Small fixed inferolateral defect.  No significant ischemia with Lexiscan stress.  Left ventricular function is normal.  Low risk stress test.    Nuclear stress test 06/24/16  Single day rest stress vasodilator sestamibi SPECT perfusion study.  There is a fixed apical perfusion defect which is likely secondary to apical thinning/scar.  There is no inducible ischemia.   Normal left ventricular size and function.  Post-rest ejection fraction 68% Low risk    Assessment and Plan:     Kathryn Reyes is 82 y.o. female with    Atrial fibrillation:   - CHA2DS2- VASc of 6-7 (HTN, age, DM, female, CHF, ?PAD)   - no palpitations   - EF WNL   - continue coumadin for stroke prophylaxis, INR followed by   - continue metoprolol for rate control    Diastolic CHF: Echo 06/12/13 Mild LVH with preserved systolic function.  EF 65% or  greater without wall motion abnormalities or thinning.  Mild decreased compliance.  - no SOB or LE edema   - continue BB, ARB, lasix    Coronary artery status: Nuclear stress test 06/24/16 Single day rest stress vasodilator sestamibi SPECT perfusion study.  There is a fixed apical perfusion defect which is likely secondary to apical thinning/scar.  There is no inducible ischemia. Normal left ventricular size and function. Post-rest ejection fraction 68% Low risk  - no CP or SOB    H/o PE   - continue coumadin     [facility] AB[facility] 2006 normal   - no claudication     HTN with mild LVH: BP controlled today   - continue meds    Hyperlipidemia: FLP 08/26/14 total 227 trig 85 HDL 42 LDL 168  - not on statin, encouraged heart healthy diet and exercise     Thank you for allowing me to participate in the care of your patient. Please feel free to contact me if there are further questions.     I am scribing for, and in the presence of Dr. Lenda Kelpavid C Plitt, MD for services provided on 08/22/2016.  Rudean HittEmily Amarianna Abplanalp       ** don't forget .HVPROVIDERSCRIBEATTESTATION**

## 2016-08-25 ENCOUNTER — Ambulatory Visit (INDEPENDENT_AMBULATORY_CARE_PROVIDER_SITE_OTHER): Payer: Self-pay | Admitting: Cardiovascular Disease

## 2016-10-01 ENCOUNTER — Encounter (INDEPENDENT_AMBULATORY_CARE_PROVIDER_SITE_OTHER): Payer: Self-pay | Admitting: Cardiovascular Disease

## 2016-10-01 NOTE — Nursing Note (Deleted)
Kathryn Reyes is a 83 y.o. female here to establish care. [facility]. Troponins were negative, nuclear stress test was done and was low risk.  She was discharged the same day. She has a known h/o Afib, on coumadin, diastolic CHF, PE, and ?PAD. Today she     ----------------------------------------------------------------------------    ABI 11/21/04  Normal lower extremity ABIs with atherosclerotic plaque noted without hemodynamically significant stenosis    VQ scan 05/25/09  Low probability scan for emboli.    Echo 06/12/13  1. Mild LVH with preserved systolic function. EF 65% or greater without wall motion abnormalities or thinning. Milddecreased compliance.  2. Left atrium is top normal in size to mildly dilated.  3. The right ventricle is normal size, normal function.  4. The right atrium is normal in size.  5. Interatrial septum is unremarkable. No defects. No masses.  6. Aorta: Ascending aorta is normal.  7. Aortic valve is trileaflet. There is mild sclerosisof the valve. There is no significant stenosis. There is  trace AI.  8. Mitral valve normal morphology and function. There is  no MS. there is trace MR.  9. Tricuspid valve normal morphology with mild tricuspidinsufficiency. Estimated RV systolic pressure about 30 to 35 mmHg consistent with mild pulmonary hypertension.  10. Pulmonic valve normal function.  11. Pericardium normal    Nuclear stress test 08/25/14  Small fixed inferolateral defect. No significant ischemia with Lexiscan stress. Left ventricular function is normal. Low risk stress test.    Nuclear stress test 06/24/16  Single day rest stress vasodilator sestamibi SPECT perfusion study. There is a fixed apical perfusion defect which is likelysecondary to apical thinning/scar. There is no inducible ischemia.   Normal left ventricular size and function. Post-rest ejection fraction 68% Low risk     --------------------------------------------------------------------------------    Atrial fibrillation:   - CHA2DS2- VASc of 6-7 (HTN, age, DM, female, CHF, ?PAD)   - no palpitations   - EF WNL   - continue coumadin for stroke prophylaxis, INR followed by   - continue metoprolol for rate control    Diastolic CHF: Echo 10/5/14Mild LVH with preserved systolic function. EF 65% or greater without wall motion abnormalities or thinning. Milddecreased compliance.  - no SOB or LE edema   - continue BB, ARB, lasix    Coronary artery status: Nuclear stress test 06/24/16 Single day rest stress vasodilator sestamibi SPECT perfusion study. There is a fixed apical perfusion defect which is likelysecondary to apical thinning/scar. There is no inducible ischemia. Normal left ventricular size and function. Post-rest ejection fraction 68% Low risk  - no CP or SOB    H/o PE   - continue coumadin     ?[facility] ABIs 2006 normal   - no claudication     HTN with mild LVH: BP controlled today   - continue meds    Hyperlipidemia: FLP 08/26/14 total 227 trig 85 HDL 42 LDL 168  - not on statin, encouraged heart healthy diet and exercise

## 2016-10-02 ENCOUNTER — Ambulatory Visit (INDEPENDENT_AMBULATORY_CARE_PROVIDER_SITE_OTHER): Payer: Medicare PPO | Admitting: Cardiovascular Disease

## 2016-10-02 ENCOUNTER — Encounter (INDEPENDENT_AMBULATORY_CARE_PROVIDER_SITE_OTHER): Payer: Self-pay | Admitting: Cardiovascular Disease

## 2016-10-02 VITALS — BP 143/84 | HR 80 | Ht 63.0 in | Wt 225.0 lb

## 2016-10-02 DIAGNOSIS — I480 Paroxysmal atrial fibrillation: Secondary | ICD-10-CM

## 2016-10-02 DIAGNOSIS — E7849 Other hyperlipidemia: Secondary | ICD-10-CM

## 2016-10-02 DIAGNOSIS — I2699 Other pulmonary embolism without acute cor pulmonale: Secondary | ICD-10-CM

## 2016-10-02 DIAGNOSIS — I10 Essential (primary) hypertension: Secondary | ICD-10-CM

## 2016-10-02 DIAGNOSIS — I5030 Unspecified diastolic (congestive) heart failure: Secondary | ICD-10-CM

## 2016-10-02 DIAGNOSIS — R600 Localized edema: Secondary | ICD-10-CM

## 2016-10-02 DIAGNOSIS — E784 Other hyperlipidemia: Secondary | ICD-10-CM

## 2016-10-02 MED ORDER — METOLAZONE 2.5 MG TABLET
2.5000 mg | ORAL_TABLET | Freq: Every day | ORAL | 0 refills | Status: DC
Start: 2016-10-02 — End: 2016-11-05

## 2016-10-02 MED ORDER — FUROSEMIDE 40 MG TABLET
40.0000 mg | ORAL_TABLET | Freq: Every day | ORAL | 1 refills | Status: DC
Start: 2016-10-02 — End: 2016-11-24

## 2016-10-02 NOTE — Patient Instructions (Signed)
-   increase lasix (furosemide) to 40 mg daily   - if no improvement, start metolazone 2.5mg  QD, take 60-3130min prior to lasix

## 2016-10-02 NOTE — Progress Notes (Signed)
[facility] [place]  [address]  [address]  [place] [address]  [phone]    Date: 10/02/2016  Patient Name: Kathryn Reyes  MRN#: [id number]  DOB: 15-Nov-1927    Provider: Lenda Kelp, MD  PCP: Rayburn Go, MD    Reason for visit: Establish Care; Arrhythmia (afib ); and Leg Swelling    History:   Kathryn Reyes is a 83 y.o. female here to establish care. She presented to [facility] on 06/25/16 due to chest pain. Troponins were negative, nuclear stress test was done and was low risk. She was discharged the same day. She has a known h/o Afib, on coumadin, as well as diastolic CHF, PE, and ?PAD. Today she reports SOB while walking, long-standing and stable. This causes her to stop and rest and do breathing exercises. She is not active at home, other than walking to her mailbox. She feels her SOB is about the same when compared to a year ago. She has LE edema, also long-standing and unchanged. She reports multiple esophageal dilations. No c/o any CP, palpitations, dizziness, presyncope, syncope, orthopnea, PND, or claudication sxs. She is a former smoker. Denies alcohol use.     Past Medical History:     Past Medical History:   Diagnosis Date   . Arthritis    . Atrial fibrillation (HCC)    . Congestive heart failure (CHF) (HCC)    . Disorder of thyroid    . Dysphagia    . Esophageal reflux    . Hernia, hiatal    . Hypertension    . Pulmonary embolism (HCC)    . Seasonal allergic rhinitis            Past Surgical History:     Past Surgical History:   Procedure Laterality Date   . COLONOSCOPY     . HX HYSTERECTOMY             Allergies:     Allergies   Allergen Reactions   . Azithromycin      Other reaction(s): Other (See Comments)   . Codeine      Other reaction(s): Other (See Comments)   . Erythromycin    . Iodine      Other reaction(s): Other (See Comments)   . Oxycodone-Acetaminophen      Other reaction(s): Other (See Comments)   . Penicillins      Other reaction(s): Other (See Comments)   .  Sulfasalazine      Other reaction(s): Other (See Comments)   . Tramadol      Other reaction(s): Other (See Comments)       Medications:     Current Outpatient Prescriptions   Medication Sig   . amLODIPine (NORVASC) 5 mg Oral Tablet Take 5 mg by mouth Once a day   . furosemide (LASIX) 40 mg Oral Tablet Take 1 Tab (40 mg total) by mouth Once a day   . gabapentin (NEURONTIN) 300 mg Oral Capsule Take 300 mg by mouth Twice daily   . losartan (COZAAR) 100 mg Oral Tablet Take 100 mg by mouth Once a day   . metOLazone (ZAROXOLYN) 2.5 mg Oral Tablet Take 1 Tab (2.5 mg total) by mouth Once a day   . metoprolol (LOPRESSOR) 50 mg Oral Tablet Take 50 mg by mouth Twice daily   . pantoprazole (PROTONIX) 40 mg Oral Tablet, Delayed Release (E.C.) Take 40 mg by mouth Once a day   . warfarin (COUMADIN) 1 mg Oral  Tablet Take 1 mg by mouth Once a day       Family History:     Family Medical History     Problem Relation (Age of Onset)    Cancer Brother    Stroke Mother              Social History:     Social History     Social History   . Marital status: Divorced     Spouse name: N/A   . Number of children: N/A   . Years of education: N/A     Occupational History   . retired      Social History Main Topics   . Smoking status: Former Smoker     Packs/day: 1.00     Types: Cigarettes     Quit date: 10/02/2001   . Smokeless tobacco: Never Used   . Alcohol use No   . Drug use: Not on file   . Sexual activity: Not on file     Other Topics Concern   . Not on file     Social History Narrative   . No narrative on file       Review of Systems:     All other systems were reviewed and are negative other than noted in the HPI. No bleeding issues.    Physical Exam:     Vitals:    10/02/16 1120   BP: (!) 143/84   Pulse: 80   SpO2: 92%   Weight: 102.1 kg (225 lb)   Height: 1.6 m (5\' 3" )     Body mass index is 39.86 kg/(m^2).    General: No acute distress, obese  HEENT: Moist mucous membranes  Neck: Supple, no JVD, no bruits  Heart/Cardiovascular:   Irregular  No murmurs, rubs, or gallops  2+ radial pulses  Unable to examine LE pulses due to edema  Respiratory/Lungs: Non-labored, clear to auscultation  Abdominal/Gastrointestinal: Soft, non-tender  Extremities: Warm and well-perfused, 4+ LE edema, pitting  Skin: No rash  Neurological: Alert and oriented x3, grossly nonfocal  Psych: Normal affect    Cardiovascular Workup:     ABI 11/21/04  Normal lower extremity ABIs with atherosclerotic plaque noted without hemodynamically significant stenosis    VQ scan 05/25/09  Low probability scan for emboli.    Echo 06/12/13  1. Mild LVH with preserved systolic function. EF 65% or greater without wall motion abnormalities or thinning. Milddecreased compliance.  2. Left atrium is top normal in size to mildly dilated.  3. The right ventricle is normal size, normal function.  4. The right atrium is normal in size.  5. Interatrial septum is unremarkable. No defects. No masses.  6. Aorta: Ascending aorta is normal.  7. Aortic valve is trileaflet. There is mild sclerosisof the valve. There is no significant stenosis. There istrace AI.  8. Mitral valve normal morphology and function. There isno MS. there is trace MR.  9. Tricuspid valve normal morphology with mild tricuspidinsufficiency. Estimated RV systolic pressure about 30 to 35 mmHg consistent with mild pulmonary hypertension.  10. Pulmonic valve normal function.  11. Pericardium normal    Nuclear stress test 08/25/14  Small fixed inferolateral defect. No significant ischemia with Lexiscan stress. Left ventricular function is normal. Low risk stress test.    Nuclear stress test 06/24/16  Single day rest stress vasodilator sestamibi SPECT perfusion study. There is a fixed apical perfusion defect which is likelysecondary to apical thinning/scar. There is no inducible ischemia.  Normal left ventricular size and function. Post-rest ejection fraction 68% Low risk    Assessment and Plan:      Kathryn Reyes is 83 y.o. female with    Atrial fibrillation: ECG today shows AFib, HR 66bpm  - CHA2DS2- VASc of 6-7 (HTN, age, DM, female, CHF, ?PAD)   - continue coumadin for stroke prophylaxis, INR followed by PCP   - continue metoprolol for rate control  - echo as below  - discussed possible DCCV if no improvement in SOB with diuretics as below     LE edema: stable but severe  - increase lasix to 40 mg daily   - BMP unremarkable 06/2016  - recommend daily weights  - if no improvement, start metolazone 2.5mg  QD, take 60-30 min prior to lasix  - order BMP to be done prior to next visit    - echo to re-assess cardiac function    Diastolic CHF: Echo 10/5/14Mild LVH with preserved systolic function. EF 65% or greater without wall motion abnormalities or thinning. Milddecreased compliance.  - c/o SOB, unchanged over the past year  - pt does not weight herself daily, recommended daily weights  - stable, significant LE edema   - continue BB, ARB  - changes as above    Coronary artery status: Nuclear stress test 06/24/16 Single day rest stress vasodilator sestamibi SPECT perfusion study. There is a fixed apical perfusion defect which is likelysecondary to apical thinning/scar. There is no inducible ischemia. Normal left ventricular size and function. Post-rest ejection fraction 68% Low risk  - no CP   - c/o SOB, unchanged over the past year (see above plan)    ?PAD per [facility] note: ABIs 2006 normal   - no claudication     HTN with mild LVH: BP upper normal  - continue meds, re-assess after diuresis    Lipids: FLP 08/26/14 total 227 trig 85 HDL 42 LDL 168  - not on statin, encouraged heart healthy diet and exercise   - f/u PCP    Former smoker    Thank you for allowing me to participate in the care of your patient. Please feel free to contact me if there are further questions.     I am scribing for, and in the presence of Dr. Lenda Kelp, MD for services provided on 10/02/2016.  Corky Mull  [place]  10/02/2016, 11:50    I personally performed the services described in this documentation, as scribed in my presence, and it is both accurate and complete.    Lenda Kelp, MD    Lenda Kelp, MD  10/02/2016, 12:03

## 2016-10-16 ENCOUNTER — Telehealth (INDEPENDENT_AMBULATORY_CARE_PROVIDER_SITE_OTHER): Payer: Self-pay | Admitting: Cardiovascular Disease

## 2016-10-16 NOTE — Telephone Encounter (Signed)
-----   Message from Lenda Kelpavid C Plitt, MD sent at 10/16/2016  1:20 PM EST -----  Regarding: RE: meds  I would have her stop metolazone and just continue increased dose of Lasix (40 mg once daily) for now.  If she gains >5 pounds would have her resume Metolazone prn to be taken 30 minutes prior to Lasix (but not daily, maybe three time weekly).  If she still feels dizzy after this she should be seen asap.  She should also have labs done prior to her scheduled f/u at end of the month (ordered last visit).    ----- Message -----     From: Lauree ChandlerWoelfel, Kert Shackett, LPN     Sent: 1/9/14782/04/2017   1:00 PM       To: Lenda Kelpavid C Plitt, MD  Subject: meds                                             Patient's daughter called in stating patient was started on a booster and a fluid pill at last visit per last ov (LE edema: stable but severe  - increase lasix to 40 mg daily   - BMP unremarkable 06/2016  - recommend daily weights  - if no improvement, start metolazone 2.5mg  QD, take 60-30 min prior to lasix  - order BMP to be done prior to next visit    - echo to re-assess cardiac function)  Patient states she now feels dizzy and week daughter is wondering if she could use a adjustment on meds. Advised to make sure patient is staying hydrated and taking the meds 30-60 mins apart. Daughter also stated LE edema is improved. Please advise

## 2016-10-16 NOTE — Telephone Encounter (Signed)
Called and spoke to Daughter advised to stop zaroxolyn if gains >5 pounds restart but only 3x a week not very day continue lasix at current dose lab order printed and mailed per request of daughter. Will have labs completed before follow up appt that is to be scheduled after all testing at the end of the month. If this does not help and dizziness persists advised to be worked in sooner. Spoke to insurance specialist they are calling daughter to schedule testing and appt no auth required. N o further needs at this time Lauree Chandlerathy Teondra [place], LPN

## 2016-10-20 ENCOUNTER — Other Ambulatory Visit (INDEPENDENT_AMBULATORY_CARE_PROVIDER_SITE_OTHER): Payer: Medicare PPO

## 2016-10-22 ENCOUNTER — Other Ambulatory Visit (INDEPENDENT_AMBULATORY_CARE_PROVIDER_SITE_OTHER): Payer: Medicare PPO

## 2016-10-22 ENCOUNTER — Inpatient Hospital Stay (INDEPENDENT_AMBULATORY_CARE_PROVIDER_SITE_OTHER)
Admission: RE | Admit: 2016-10-22 | Discharge: 2016-10-22 | Disposition: A | Discharge status: Home / Self Care | Payer: Medicare PPO | Source: Ambulatory Visit | Attending: Cardiovascular Disease | Admitting: Cardiovascular Disease

## 2016-10-22 ENCOUNTER — Ambulatory Visit
Admission: RE | Admit: 2016-10-22 | Discharge: 2016-10-22 | Disposition: A | Discharge status: Home / Self Care | Payer: Medicare (Managed Care) | Source: Ambulatory Visit | Attending: Cardiovascular Disease | Admitting: Cardiovascular Disease

## 2016-10-22 DIAGNOSIS — I480 Paroxysmal atrial fibrillation: Secondary | ICD-10-CM

## 2016-10-22 DIAGNOSIS — I361 Nonrheumatic tricuspid (valve) insufficiency: Secondary | ICD-10-CM

## 2016-10-22 DIAGNOSIS — I340 Nonrheumatic mitral (valve) insufficiency: Secondary | ICD-10-CM

## 2016-10-22 LAB — BASIC METABOLIC PANEL
Anion Gap: 14.9 mMol/L (ref 7.0–18.0)
BUN / Creatinine Ratio: 11.5 Ratio (ref 10.0–30.0)
BUN: 13 mg/dL (ref 7–22)
CO2: 31.3 mMol/L — ABNORMAL HIGH (ref 20.0–30.0)
Calcium: 9.8 mg/dL (ref 8.5–10.5)
Chloride: 88 mMol/L — ABNORMAL LOW (ref 98–110)
Creatinine: 1.13 mg/dL (ref 0.60–1.20)
EGFR: 44 mL/min/{1.73_m2} — ABNORMAL LOW (ref 60–150)
Glucose: 98 mg/dL (ref 70–99)
Osmolality Calc: 261 mOsm/kg — ABNORMAL LOW (ref 275–300)
Potassium: 4.2 mMol/L (ref 3.5–5.3)
Sodium: 130 mMol/L — ABNORMAL LOW (ref 136–147)

## 2016-10-27 ENCOUNTER — Ambulatory Visit (INDEPENDENT_AMBULATORY_CARE_PROVIDER_SITE_OTHER): Payer: Self-pay | Admitting: Cardiovascular Disease

## 2016-10-27 LAB — TRANSTHORACIC ECHOCARDIOGRAM - ADULT
AV mean gradient: 0
AV peak velocity post stress: 133
AVA VTI: 0
AVA Vmax: 0
AVE E/e': 0
Biplane Simpson EF: 0
EF MEASUREMENT VALUE: 83
Interventricular Septum Diastolic Thickness by 2D: 1.2 cm
LA Volume Index: 0
LVIDD - 2D: 4.1 cm
LVIDD - 2D: 4.1 cm
LVIDS 2D: 2 cm
LVPWD: 1.2 cm
Lateral MV Annulus e': 0
MV E/A: 0
Medial MV Annulus e': 7
RVSP: 0
TR VELOCITY: 409

## 2016-10-27 NOTE — Telephone Encounter (Signed)
Patients daughter called stating patient had stopped her zaroxolyn but had also took it upon herself to decrease her lasix back to 20 mg and then had noted edema and increased lasix back to 40 mg she since then has experienced episodes of shaking/tremors. Advised this is not likely to be related to her lasix and she should take her to her PCP because she should be evaluated. Verbalized understanding Lauree Chandlerathy Lavaughn Bisig, LPN

## 2016-11-05 ENCOUNTER — Encounter (INDEPENDENT_AMBULATORY_CARE_PROVIDER_SITE_OTHER): Payer: Self-pay | Admitting: Cardiovascular Disease

## 2016-11-05 ENCOUNTER — Ambulatory Visit (INDEPENDENT_AMBULATORY_CARE_PROVIDER_SITE_OTHER): Payer: Medicare PPO | Admitting: Cardiovascular Disease

## 2016-11-05 VITALS — BP 191/85 | HR 86 | Resp 20 | Ht 63.0 in | Wt 221.0 lb

## 2016-11-05 DIAGNOSIS — I5030 Unspecified diastolic (congestive) heart failure: Secondary | ICD-10-CM

## 2016-11-05 DIAGNOSIS — R600 Localized edema: Secondary | ICD-10-CM

## 2016-11-05 DIAGNOSIS — I10 Essential (primary) hypertension: Secondary | ICD-10-CM

## 2016-11-05 DIAGNOSIS — I4891 Unspecified atrial fibrillation: Secondary | ICD-10-CM

## 2016-11-05 MED ORDER — SPIRONOLACTONE 25 MG TABLET: 25 mg | Tab | Freq: Every morning | ORAL | 1 refills | 0 days | Status: DC

## 2016-11-05 NOTE — Progress Notes (Addendum)
[facility]ART [facility] VASCULAR [facility]ST[facility] [place]  [address]  [address]  [place] [address]  [phone]    Date: 11/05/2016  Patient Name: Kathryn Reyes  MRN#: [id number]  DOB: 16-Jul-1928    Provider: Lenda Kelp, MD  PCP: Rayburn Go, MD    Reason for visit: Arrhythmia    History:   Kathryn Reyes is a 83 y.o. female with a history of A-fib and diastolic CHF. She was last seen on 10/02/16 when she had stable, but severe LE edema. Her lasix was increased and she was started on Zaroxalyn. Her daughter-in-law later called the office stating this was making her dizzy so she was recommended to stop the Zaroxalyn. Her family called again and stated that the pt had reduced her lasix back to 20 mg and then had noted edema and increased lasix back to 40 mg. Today she reports she has been feeling okay but continues to have LE edema. Her weight has gone up since her last visit.  No c/o any CP, SOB, palpitations, syncope/presyncope, orthopnea/PND, or claudication.    Past Medical History:     Past Medical History:   Diagnosis Date   . Arthritis    . Atrial fibrillation (HCC)    . Congestive heart failure (CHF) (HCC)    . Disorder of thyroid    . Dysphagia    . Esophageal reflux    . Hernia, hiatal    . Hypertension    . Pulmonary embolism (HCC)    . Seasonal allergic rhinitis            Past Surgical History:     Past Surgical History:   Procedure Laterality Date   . COLONOSCOPY     . HX HYSTERECTOMY             Allergies:     Allergies   Allergen Reactions   . Azithromycin      Other reaction(s): Other (See Comments)   . Codeine      Other reaction(s): Other (See Comments)   . Erythromycin    . Iodine      Other reaction(s): Other (See Comments)   . Oxycodone-Acetaminophen      Other reaction(s): Other (See Comments)   . Penicillins      Other reaction(s): Other (See Comments)   . Sulfasalazine      Other reaction(s): Other (See Comments)   . Tramadol      Other reaction(s): Other (See Comments)       Medications:      Current Outpatient Prescriptions   Medication Sig   . amLODIPine (NORVASC) 5 mg Oral Tablet Take 5 mg by mouth Once a day   . furosemide (LASIX) 40 mg Oral Tablet Take 1 Tab (40 mg total) by mouth Once a day   . gabapentin (NEURONTIN) 300 mg Oral Capsule Take 300 mg by mouth Twice daily   . losartan (COZAAR) 100 mg Oral Tablet Take 100 mg by mouth Once a day   . metOLazone (ZAROXOLYN) 2.5 mg Oral Tablet Take 1 Tab (2.5 mg total) by mouth Once a day   . metoprolol (LOPRESSOR) 50 mg Oral Tablet Take 50 mg by mouth Twice daily   . pantoprazole (PROTONIX) 40 mg Oral Tablet, Delayed Release (E.C.) Take 40 mg by mouth Once a day   . spironolactone (ALDACTONE) 25 mg Oral Tablet Take 1 Tab (25 mg total) by mouth Every morning with breakfast   . warfarin (COUMADIN) 1 mg Oral  Tablet Take 1 mg by mouth Once a day       Family History:     Family Medical History     Problem Relation (Age of Onset)    Cancer Brother    Stroke Mother          Social History:     Social History     Social History   . Marital status: Divorced     Spouse name: N/A   . Number of children: N/A   . Years of education: N/A     Occupational History   . retired      Social History Main Topics   . Smoking status: Former Smoker     Packs/day: 1.00     Types: Cigarettes     Quit date: 10/02/2001   . Smokeless tobacco: Never Used   . Alcohol use No   . Drug use: Not on file   . Sexual activity: Not on file     Other Topics Concern   . Not on file     Social History Narrative       Review of Systems:     All other systems were reviewed and are negative other than noted in the HPI.    Physical Exam:     Vitals:    11/05/16 1538   BP: (!) 191/85   Pulse: 86   Resp: 20   SpO2: 90%   Weight: 100.2 kg (221 lb)   Height: 1.6 m (5\' 3" )     Body mass index is 39.15 kg/(m^2).    General: No acute distress, obese  HEENT: Moist mucous membranes  Neck: Supple, no JVD, no bruits  Heart/Cardiovascular:  Irregular  No murmurs, rubs, or gallops  2+ radial pulses  Unable to  examine LE pulses due to edema  Respiratory/Lungs: Non-labored, clear to auscultation  Abdominal/Gastrointestinal: Soft, non-tender  Extremities: Warm and well-perfused, 4+ LE edema, pitting  Skin: No rash  Neurological: Alert and oriented x3, grossly nonfocal  Psych: Normal affect    Cardiovascular Workup:     ABI 11/21/04  Normal lower extremity ABIs with atherosclerotic plaque noted without hemodynamically significant stenosis    VQ scan 05/25/09  Low probability scan for emboli.    Echo 06/12/13  1. Mild LVH with preserved systolic function. EF 65% or greater without wall motion abnormalities or thinning. Milddecreased compliance.  2. Left atrium is top normal in size to mildly dilated.  3. The right ventricle is normal size, normal function.  4. The right atrium is normal in size.  5. Interatrial septum is unremarkable. No defects. No masses.  6. Aorta: Ascending aorta is normal.  7. Aortic valve is trileaflet. There is mild sclerosisof the valve. There is no significant stenosis. There istrace AI.  8. Mitral valve normal morphology and function. There isno MS. there is trace MR.  9. Tricuspid valve normal morphology with mild tricuspidinsufficiency. Estimated RV systolic pressure about 30 to 35 mmHg consistent with mild pulmonary hypertension.  10. Pulmonic valve normal function.  11. Pericardium normal    Nuclear stress test 08/25/14  Small fixed inferolateral defect. No significant ischemia with Lexiscan stress. Left ventricular function is normal. Low risk stress test.    Nuclear stress test 06/24/16  Single day rest stress vasodilator sestamibi SPECT perfusion study. There is a fixed apical perfusion defect which is likelysecondary to apical thinning/scar. There is no inducible ischemia.   Normal left ventricular size and function. Post-rest ejection fraction 68%  Low risk    Echo 10/22/16  1. Normal left ventricular size though appears to be underfilling. LV Ejection  Fraction is ~70 %. There is  flattening of the interventricular septum in diastole, consistent with right ventricular volume overload.  Mild left ventriclar hypertrophy is present.  2. Total wall motion score is 1.00. There are no regional wall motion abnormalities.  3. Severely dilated right ventricle. RV systolic pressure is consistent with critical (near systemic)  pulmonary hypertension.  4. Severely dilated left atrium.  5. Severely dilated right atrium.  6. There is mild primary mitral valve regurgitation.  7. Mild aortic valve regurgitation. No Aortic Valve Stenosis.  8. Mild pulmonary valve stenosis.  9. The aortic root is normal in size.  10. The inferior vena cava is dilated.  11. Coronary sinus measured 1.4 cm which is dilated indicating possible presence of left persistant  superior vena cava    Assessment and Plan:     Kathryn Reyes is a 83 y.o. female with    Atrial fibrillation: ECG today shows AFib, HR 66bpm  - CHA2DS2- VASc of 6-7 (HTN, age, DM, female, CHF, ?PAD)   - continue coumadin for stroke prophylaxis, INR followed by PCP   - continue metoprolol for rate control  - discussed possible DCCV if no improvement with diuretics as below     Pulmonary HTN: Echo 10/22/16 severely dilated right ventricle. RV systolic pressure is consistent with critical (near systemic) pulmonary hypertension.  - continue CCB, diuretics  - can consider evaluation with serologies, VQ scan, RHC, etc but I am not sure what difference this will make in her therapy.    - check BNP    LE edema: stable but severe  - BMP unremarkable 06/2016  - recommend daily weights  -continued LE edema  - stopped Zaroxalyn given lightheadedness/dizziness  - continue on Lasix  - will start on Aldactone 25 mg qd  - BMP, BNP to be done before next visit  - will do a venous reflux study to r/o venous reflux disease    Diastolic CHF: Echo 10/5/14Mild LVH with preserved systolic function. EF 65% or greater without wall motion abnormalities or  thinning. Milddecreased compliance. Echo 10/22/16 EF ~70%  - c/o SOB, unchanged over the past year  - weight up from last visit  - continued LE edema  - stopped Zaroxalyn given lightheadedness/dizziness  - continue BB, ARB  - will start on Aldactone 25 mg qd  - BMP, BNP in 3-4 weeks    Coronary artery status: Nuclear stress test 06/24/16 Single day rest stress vasodilator sestamibi SPECT perfusion study. There is a fixed apical perfusion defect which is likelysecondary to apical thinning/scar. There is no inducible ischemia. Normal left ventricular size and function. Post-rest ejection fraction 68% Low risk  - no CP     ?PAD per [facility] note: ABIs 2006 normal   - no claudication     HTN with mild LVH: BP is elevated  - med changes as above    Lipids: FLP 08/26/14 total 227 trig 85 HDL 42 LDL 168  - not on statin, encouraged heart healthy diet and exercise     Former smoker    Thank you for allowing me to participate in the care of your patient. Please feel free to contact me if there are further questions.     I am scribing for, and in the presence of Dr. Lenda Kelp, MD for services provided on 11/05/2016.  Dustan Demello, SCRIBE  Dustan Demello, SCRIBE  11/05/2016, 15:58    I personally performed the services described in this documentation, as scribed in my presence, and it is both accurate and complete.    Lenda Kelpavid C Tel Hevia, MD    Lenda Kelpavid C Kaydence Baba, MD  11/05/2016, 16:15

## 2016-11-10 DIAGNOSIS — I482 Chronic atrial fibrillation: Secondary | ICD-10-CM | POA: Diagnosis not present

## 2016-11-10 DIAGNOSIS — Z5181 Encounter for therapeutic drug level monitoring: Secondary | ICD-10-CM | POA: Diagnosis not present

## 2016-11-10 DIAGNOSIS — Z7901 Long term (current) use of anticoagulants: Secondary | ICD-10-CM | POA: Diagnosis not present

## 2016-11-12 ENCOUNTER — Inpatient Hospital Stay (INDEPENDENT_AMBULATORY_CARE_PROVIDER_SITE_OTHER)
Admission: RE | Admit: 2016-11-12 | Discharge: 2016-11-12 | Disposition: A | Discharge status: Home / Self Care | Payer: Medicare PPO | Source: Ambulatory Visit | Attending: Cardiovascular Disease | Admitting: Cardiovascular Disease

## 2016-11-12 DIAGNOSIS — I5030 Unspecified diastolic (congestive) heart failure: Secondary | ICD-10-CM | POA: Diagnosis not present

## 2016-11-12 DIAGNOSIS — R600 Localized edema: Secondary | ICD-10-CM | POA: Diagnosis not present

## 2016-11-12 DIAGNOSIS — I872 Venous insufficiency (chronic) (peripheral): Secondary | ICD-10-CM | POA: Diagnosis not present

## 2016-11-16 ENCOUNTER — Other Ambulatory Visit: Payer: Self-pay

## 2016-11-24 ENCOUNTER — Other Ambulatory Visit (INDEPENDENT_AMBULATORY_CARE_PROVIDER_SITE_OTHER): Payer: Self-pay | Admitting: Cardiovascular Disease

## 2016-11-24 DIAGNOSIS — R600 Localized edema: Secondary | ICD-10-CM

## 2016-11-25 MED ORDER — FUROSEMIDE 40 MG TABLET
40.0000 mg | ORAL_TABLET | Freq: Every day | ORAL | 0 refills | Status: DC
Start: 2016-11-25 — End: 2017-01-01

## 2016-11-28 DIAGNOSIS — Z7901 Long term (current) use of anticoagulants: Secondary | ICD-10-CM | POA: Diagnosis not present

## 2016-11-28 DIAGNOSIS — Z5181 Encounter for therapeutic drug level monitoring: Secondary | ICD-10-CM | POA: Diagnosis not present

## 2016-11-28 DIAGNOSIS — I482 Chronic atrial fibrillation: Secondary | ICD-10-CM | POA: Diagnosis not present

## 2016-12-17 ENCOUNTER — Encounter (INDEPENDENT_AMBULATORY_CARE_PROVIDER_SITE_OTHER): Payer: Medicare PPO | Admitting: Cardiovascular Disease

## 2016-12-17 ENCOUNTER — Ambulatory Visit
Admission: RE | Admit: 2016-12-17 | Discharge: 2016-12-17 | Disposition: A | Discharge status: Home / Self Care | Payer: Medicare (Managed Care) | Attending: Gastroenterology | Admitting: Gastroenterology

## 2016-12-17 ENCOUNTER — Encounter (HOSPITAL_BASED_OUTPATIENT_CLINIC_OR_DEPARTMENT_OTHER): Payer: Self-pay

## 2016-12-17 ENCOUNTER — Ambulatory Visit (HOSPITAL_BASED_OUTPATIENT_CLINIC_OR_DEPARTMENT_OTHER): Payer: Medicare (Managed Care) | Admitting: Anesthesiology

## 2016-12-17 ENCOUNTER — Encounter (HOSPITAL_BASED_OUTPATIENT_CLINIC_OR_DEPARTMENT_OTHER)
Admission: RE | Disposition: A | Discharge status: Home / Self Care | Payer: Self-pay | Source: Home / Self Care | Attending: Gastroenterology

## 2016-12-17 ENCOUNTER — Ambulatory Visit (HOSPITAL_BASED_OUTPATIENT_CLINIC_OR_DEPARTMENT_OTHER): Payer: Medicare (Managed Care) | Admitting: Gastroenterology

## 2016-12-17 DIAGNOSIS — R109 Unspecified abdominal pain: Secondary | ICD-10-CM | POA: Diagnosis not present

## 2016-12-17 DIAGNOSIS — R1310 Dysphagia, unspecified: Secondary | ICD-10-CM | POA: Diagnosis not present

## 2016-12-17 DIAGNOSIS — K259 Gastric ulcer, unspecified as acute or chronic, without hemorrhage or perforation: Secondary | ICD-10-CM | POA: Diagnosis not present

## 2016-12-17 HISTORY — PX: EGD: SHX3789

## 2016-12-17 SURGERY — DONT USE, USE 1095-ESOPHAGOGASTRODUODENOSCOPY (EGD), DIAGNOSTIC
Anesthesia: Anesthesia MAC / Sedation | Site: Abdomen | Wound class: Clean Contaminated

## 2016-12-17 MED ORDER — SODIUM CHLORIDE 0.9 % IV SOLN
INTRAVENOUS | Status: DC | PRN
Start: 2016-12-17 — End: 2016-12-17

## 2016-12-17 MED ORDER — PEG 3350-KCL-NABCB-NACL-NASULF 236 G PO SOLR
4.0000 L | ORAL | Status: DC | PRN
Start: 2016-12-17 — End: 2016-12-17

## 2016-12-17 MED ORDER — PROPOFOL 200 MG/20ML IV EMUL
INTRAVENOUS | Status: AC
Start: 2016-12-17 — End: ?
  Filled 2016-12-17: qty 20

## 2016-12-17 MED ORDER — SODIUM CHLORIDE 0.9 % IV SOLN
INTRAVENOUS | Status: DC
Start: 2016-12-17 — End: 2016-12-17

## 2016-12-17 MED ORDER — PROPOFOL 200 MG/20ML IV EMUL
INTRAVENOUS | Status: DC | PRN
Start: 2016-12-17 — End: 2016-12-17
  Administered 2016-12-17: 80 mg via INTRAVENOUS
  Administered 2016-12-17: 50 mg via INTRAVENOUS

## 2016-12-17 MED ORDER — ONDANSETRON 4 MG PO TBDP
4.0000 mg | ORAL_TABLET | ORAL | Status: DC | PRN
Start: 2016-12-17 — End: 2016-12-17

## 2016-12-17 MED ORDER — LACTATED RINGERS IV SOLN
50.0000 mL/h | INTRAVENOUS | Status: DC
Start: 2016-12-17 — End: 2016-12-17

## 2016-12-17 MED ORDER — ONDANSETRON HCL 4 MG/2ML IJ SOLN
4.0000 mg | INTRAMUSCULAR | Status: DC | PRN
Start: 2016-12-17 — End: 2016-12-17

## 2016-12-17 SURGICAL SUPPLY — 54 items
BASKET MED RETRIEV HEX 45X15 (Supply) IMPLANT
BRUSH CLEANING COMBINATION (Supply) ×1
BRUSH HEDGEHOG DUAL END (Supply) ×1 IMPLANT
BRUSH HEDGEHOG SINGLE END DISP (Supply) ×1 IMPLANT
BRUSH SINGLE END DISP (Supply) ×1
CAPSULE BRAVO (Supply) IMPLANT
CATH BALLOON DILATATION 5837 (Supply) IMPLANT
CATH BARRX 60 RFA FOCA (Supply) IMPLANT
CATH BARRX 90 RFA FOCAL (Supply) IMPLANT
CATH BARRX CHANNEL RFA ENDO (Supply) IMPLANT
CATH BARRX ULTRA-LONG RFA FOCA (Supply) IMPLANT
CATH GLD PROBE BICAP 7FX210CM (Supply) IMPLANT
CATH GLD PROBE BICAP 7FX300CM (Supply) IMPLANT
CATH GOLD PROBE 10FR (Supply) IMPLANT
CATH GOLD PROBE INJECTION 10F (Supply) IMPLANT
CLIP RESOLUTION 360 (Supply) IMPLANT
CONN IRRG TORRENT 1WAYVAL ENDO (Supply) ×2 IMPLANT
CRE BALLOON 10-12 DILATOR 5835 (Supply) IMPLANT
CRE BALLOON 12-15 DILATOR 5836 (Supply) IMPLANT
CRE BALLOON 18-20 DILATOR 5838 (Supply) IMPLANT
CRE BALLOON 6-8 DILAT 5833 (Supply) IMPLANT
CRE BALLOON 8-10 DILAT 5834 (Supply) IMPLANT
CRE ESOPH PYLORIC COL 10-12 (Supply) IMPLANT
CRE ESOPH PYLORIC COL 12-15 (Supply) IMPLANT
CRE ESOPH PYLORIC COL 15-18 (Supply) IMPLANT
CRE ESOPH PYLORIC COL 18-20 (Supply) IMPLANT
CRE ESOPH PYLORIC COL 6-8 (Supply) IMPLANT
CRE ESOPH PYLORIC COL 8-10 (Supply) IMPLANT
CRE PYLORIC COL 10-12 DIL 5847 (Supply) IMPLANT
CRE PYLORIC COL 12-15 DIL 5848 (Supply) IMPLANT
CRE PYLORIC COL 15-18 DIL 5849 (Supply) IMPLANT
CRE PYLORIC COL 8-10 DIL 5846 (Supply) IMPLANT
DEVICE STERIFLATE INFLATION (Supply) IMPLANT
DEVICE TALON GRASP 2.4X160 (Supply) IMPLANT
DIL CRE 18-20 PYL CLN 5850 (Supply) IMPLANT
DREAMWIRE STR .035 450CM (Supply) IMPLANT
FORCEP BIOPSY HOT RADIAL JAW 4 (Supply) IMPLANT
FORCEP BIOPSY RAD JAW 1333-40 (Supply) ×2 IMPLANT
FORCEP RAT TOOTH GRASP 2.4 (Supply) IMPLANT
FORCEP RESCUE RAT/ALL 8X230 (Supply) IMPLANT
KIT ENDOZIME BEDSIDE CARE 500 (Supply) ×2 IMPLANT
KIT PEG 18 FR (Supply) IMPLANT
KIT STD PEG 20FR PULL (Supply) IMPLANT
MARKER ENDOSCOPIC SPOT (Supply) IMPLANT
NDL INTERJECT SCLERO 25G (Supply) IMPLANT
OVERTUBE GUARD ESOPH 10.0-17.9 (Supply) IMPLANT
OVERTUBE GUARD ESOPH 8.6-10.00 (Supply) IMPLANT
PAD CLINCH ENDO TRASPORT (Supply) ×2 IMPLANT
PEG TUBE 18F GASTRO ENDOV (Supply) IMPLANT
PROBE SIDE FIRE (Supply) IMPLANT
PROBE STRAIGHT FIRE APC (Supply) IMPLANT
RESCUENET RETRIEVAL  2.5X230 (Supply) IMPLANT
SNARE CAPTIVATOR ERM (Supply) IMPLANT
SPEEDBAND (Supply) IMPLANT

## 2016-12-17 NOTE — Transfer of Care (Signed)
Anesthesia Transfer of Care Note      Patient Name:     Barbara [name]    Procedures Performed:     Procedure(s):  EGD w/ dilitation    Anesthesia Type:     Conscious Sedation    Patient Location:     PACU    Last Vitals:     Vitals:    12/17/16 1351   BP: 154/82   Pulse: 79   Resp: 17   SpO2: 94%       Post Pain:     Patient not complaining of pain, continue current therapy    Mental Status:     Awake    Respiratory Function:     Adequate    Cardiovascular:       Stable    Nausea/Vomiting:      Patient not complaining of nausea     Hydration Status:     Adequate    Post Assessment:      No apparent anesthetic complications, no reportable events     Deon Pilling, MD  12/17/2016 2:05 PM             vss

## 2016-12-17 NOTE — Anesthesia Preprocedure Evaluation (Addendum)
Anesthesia Evaluation    AIRWAY    Mallampati: II    TM distance: >3 FB  Neck ROM: full  Mouth Opening:full   CARDIOVASCULAR    cardiovascular exam normal, regular and normal       DENTAL    no notable dental hx     PULMONARY    pulmonary exam normal and clear to auscultation     OTHER FINDINGS              Relevant Problems   No active problems are marked relevant to this note.               Anesthesia Plan    ASA 3     MAC                     intravenous induction   Detailed anesthesia plan: MAC        Post op pain management: per surgeon    informed consent obtained                   Signed by: Deon Pilling 12/17/16 1:13 PM

## 2016-12-17 NOTE — Discharge Instr - AVS First Page (Signed)
Endoscopy Discharge Instructions    After you leave the hospital:  1. Due to the effects of the sedatives, you may feel tired for the remainder of today.   2. We recommend you go home after discharge  3. It is advisable to have supervision or access to seek help for a few hours after discharge  4. Do not drive, operate machinery, or sign important documents until tomorrow.  5. Please rest and drink extra fluids today.   6.  Avoid alcohol today.  7. Start with light easily tolerated foods advance to a regular diet as tolerated.  8. Resume your normal activities / work tomorrow    When to seek help -     Nausea / Vomiting: - try small amounts of bland foods like crackers or toast, & liquids such as soda, electrolyte replacement drinks or ice chips. Call for:  . New onset or ongoing nausea and vomiting   . The symptoms worsen - cold skin, confusion, blood or unexplainable black appearing vomit  Bleeding:  . Passing of blood or clots  or a change in stool consistency and or black in color  . Vomiting or spitting up blood  Infection:  . Redness or swelling at the IV site that continues to worsen. Initial warm compress may help.  . Fever  Pain / other:  . New onset of pain that does not subside over time or prevents you from normal activity or eating  . Shortness of breath or breathing trouble  . Weakness, feeling faint, passing out  . Swollen or distended abdomen    During business hours call your physician's office.   After-hours call [facility] [phone] and a physician will be contacted    OBSTRUCTIVE SLEEP APNEA BREATHING RISK INFORMATION  Please read!    BREATHING PRECAUTIONS AFTER YOUR SURGERY TODAY:     Sleep with your head of bed elevated at least 30 degrees extra pillows may be used   Use pain medications sparingly    Please use any breathing machines you may have at home while sleeping   A care partner is suggested for the first 24 hours    Call 911 or seek assistance for any respiratory  distress  (i.e. fast or slow breathing that is different from your normal breathing, prolonged pauses in breathing, blue tinged lips or nailbeds, shortness of breath, or altered mental status such as confusion, sleepiness, or irritability).        Dear Patient,      Thank you for choosing [facility] for your surgery.  We care about your safety during your surgery and when you return home. Today you were screened to help us decide whether you were at risk for problems that may arise with your breathing during surgery.  We used a questionnaire to look for signs of problems you may have with breathing.  The questionnaire is only a screening tool.     Obstructive Sleep Apnea (OSA) is a common breathing disorder that may occur during sleep, during surgery, or after surgery.  Patients who have OSA can sometimes be at a higher risk of breathing complications during and after surgery because of the use of medications to relieve pain.  We use these medications to help manage your discomfort during and after surgery.  These medications may sometimes cause your breathing to slow down or cause the tissues in your throat to relax.  Because these medications can cause these things to happen, we will monitor   your breathing during and after surgery while you are in the hospital.        You have a score on your screening questionnaire that indicated you may wish to have further testing in order to look for problems that may arise with your breathing at home after surgery or when you are asleep.  Please share this letter with your primary doctor and discuss whether you may need further testing, such as a sleep study.  A sleep study is the diagnostic test for OSA.  Becoming aware that you have OSA and working with your doctor to treat OSA can help keep you safe.    Sincerely,    Nicolas C. Restrepo, MD  Vice President Medical Affairs  [facility]

## 2016-12-17 NOTE — Anesthesia Postprocedure Evaluation (Signed)
Anesthesia Post Operative Evaluation      Patient Name:     Barbara [name]    Procedures Performed:     Procedure(s):  EGD w/ dilitation    Anesthesia Type:      Conscious Sedation    Patient Location:     ENDO/ENDO    Last vitals:     Temp:     HR: Heart Rate: 79   BP: BP: 154/82   RR: Resp Rate: 17   SpO2 SpO2: 94 %     Post Op Pain:      Patient not complaining of pain, continue current therapy     Mental Status:      Awake    Respiratory Function:      Tolerating nasal cannula    Cardiovascular:      Stable    Nausea/Vomiting:      Patient not complaining of nausea or vomiting    Hydration Status:      Adequate    Post Assessment:      no apparent anesthetic complications    Deon Pilling, MD  12/17/2016 2:05 PM

## 2016-12-18 ENCOUNTER — Encounter (HOSPITAL_BASED_OUTPATIENT_CLINIC_OR_DEPARTMENT_OTHER): Payer: Self-pay | Admitting: Gastroenterology

## 2016-12-29 ENCOUNTER — Encounter (INDEPENDENT_AMBULATORY_CARE_PROVIDER_SITE_OTHER): Payer: Self-pay | Admitting: Cardiovascular Disease

## 2016-12-29 NOTE — Nursing Note (Signed)
[facility] HEART & VASCULAR INST[facility] [place]  [address]  [address]  [place] [address]  [phone]    History:   Kathryn Reyes is a 83 y.o. female with a history of A-fib and diastolic CHF. She was last seen on 11/05/16 when she had continued, severe LE edema. She was started on Aldactone 25 mg daily. BMP and BNP were ordered. A venous reflux study was ordered and completed on 11/12/16 showing mild insufficiency of the right CFV, right GSV at [facility], and left GSV at [facility].     PE:   General:No acute distress, obese  HEENT: Moist mucous membranes  Neck: Supple, no JVD, no bruits  Heart/Cardiovascular:  Irregular  No murmurs, rubs, or gallops  2+ radial pulses  Unable to examine LE pulses due to edema  Respiratory/Lungs: Non-labored, clear to auscultation  Abdominal/Gastrointestinal: Soft, non-tender  Extremities: Warm and well-perfused, 4+ LE edema, pitting  Skin: No rash  Neurological: Alert and oriented x3, grossly nonfocal  Psych: Normal affect    Cardiovascular Workup:   ABI 11/21/04  Normal lower extremity ABIs with atherosclerotic plaque noted without hemodynamically significant stenosis    VQ scan 05/25/09  Low probability scan for emboli.    Echo 06/12/13  1. Mild LVH with preserved systolic function. EF 65% or greater without wall motion abnormalities or thinning. Milddecreased compliance.  2. Left atrium is top normal in size to mildly dilated.  3. The right ventricle is normal size, normal function.  4. The right atrium is normal in size.  5. Interatrial septum is unremarkable. No defects. No masses.  6. Aorta: Ascending aorta is normal.  7. Aortic valve is trileaflet. There is mild sclerosisof the valve. There is no significant stenosis. There istrace AI.  8. Mitral valve normal morphology and function. There isno MS. there is trace MR.  9. Tricuspid valve normal morphology with mild tricuspidinsufficiency. Estimated RV systolic pressure about 30 to 35 mmHg consistent with  mild pulmonary hypertension.  10. Pulmonic valve normal function.  11. Pericardium normal    Nuclear stress test 08/25/14  Small fixed inferolateral defect. No significant ischemia with Lexiscan stress. Left ventricular function is normal. Low risk stress test.    Nuclear stress test 06/24/16  Single day rest stress vasodilator sestamibi SPECT perfusion study. There is a fixed apical perfusion defect which is likelysecondary to apical thinning/scar. There is no inducible ischemia.   Normal left ventricular size and function. Post-rest ejection fraction 68% Low risk    Echo 10/22/16  1. Normal left ventricular size though appears to be underfilling. LV Ejection Fraction is ~70 %. There is flattening of the interventricular septum in diastole, consistent with right ventricular volume overload. Mild left ventriclar hypertrophy is present.  2. Total wall motion score is 1.00. There are no regional wall motion abnormalities.  3. Severely dilated right ventricle. RV systolic pressure is consistent with critical (near systemic) pulmonary hypertension.  4. Severely dilated left atrium.  5. Severely dilated right atrium.  6. There is mild primary mitral valve regurgitation.  7. Mild aortic valve regurgitation. No Aortic Valve Stenosis.  8. Mild pulmonary valve stenosis.  9. The aortic root is normal in size.  10. The inferior vena cava is dilated.  11. Coronary sinus measured 1.4 cm which is dilated indicating possible presence of left persistant superior vena cava    Venous reflux 11/12/16  Right CFV has mild insufficiency.  Right GSV HAS mild insufficiency at [facility].  Left GSV HAS mild insufficiency  at [facility].  No DVT was visualized in both lower extremities arterial systems.    Assessment:     Kathryn Reyes is a 83 y.o. female with    Atrial fibrillation:   - CHA2DS2- VASc of 6-7 (HTN, age, DM, female, CHF, ?PAD)     - discussed possible DCCV if no improvement with diuretics as below     Pulmonary HTN: Echo 10/22/16  severely dilated right ventricle. RV systolic pressure is consistent with critical (near systemic) pulmonary hypertension.  - can consider evaluation with serologies, VQ scan, RHC, etc but I am not sure what difference this will make in her therapy.      LE edema: Venous reflux 11/12/16 mild insufficiency of the right CFV, right GSV at [facility], and left GSV at [facility]  - previously stopped Zaroxalyn given lightheadedness/dizziness    Diastolic CHF: Echo 10/5/14Mild LVH with preserved systolic function. EF 65% or greater without wall motion abnormalities or thinning. Milddecreased compliance. Echo 10/22/16 EF ~70%  - previously stopped Zaroxalyn given lightheadedness/dizziness    Coronary artery status: Nuclear stress test 06/24/16 Single day rest stress vasodilator sestamibi SPECT perfusion study. There is a fixed apical perfusion defect which is likelysecondary to apical thinning/scar. There is no inducible ischemia. Normal left ventricular size and function. Post-rest ejection fraction 68% Low risk    ?PAD per [facility] note: ABIs 2006 normal     HTN with mild LVH:     Lipids: FLP 08/26/14 total 227 trig 85 HDL 42 LDL 168    Former smoker

## 2016-12-31 ENCOUNTER — Encounter (INDEPENDENT_AMBULATORY_CARE_PROVIDER_SITE_OTHER): Payer: Medicare PPO | Admitting: Cardiovascular Disease

## 2017-01-01 ENCOUNTER — Ambulatory Visit (INDEPENDENT_AMBULATORY_CARE_PROVIDER_SITE_OTHER): Payer: Medicare PPO | Admitting: Cardiovascular Disease

## 2017-01-01 ENCOUNTER — Encounter (INDEPENDENT_AMBULATORY_CARE_PROVIDER_SITE_OTHER): Payer: Self-pay | Admitting: Cardiovascular Disease

## 2017-01-01 VITALS — BP 156/81 | HR 77 | Resp 18 | Ht 63.0 in | Wt 221.0 lb

## 2017-01-01 DIAGNOSIS — I5032 Chronic diastolic (congestive) heart failure: Secondary | ICD-10-CM | POA: Diagnosis not present

## 2017-01-01 DIAGNOSIS — I272 Pulmonary hypertension, unspecified: Secondary | ICD-10-CM | POA: Diagnosis not present

## 2017-01-01 DIAGNOSIS — R600 Localized edema: Secondary | ICD-10-CM | POA: Diagnosis not present

## 2017-01-01 DIAGNOSIS — I10 Essential (primary) hypertension: Secondary | ICD-10-CM | POA: Diagnosis not present

## 2017-01-01 DIAGNOSIS — I480 Paroxysmal atrial fibrillation: Secondary | ICD-10-CM | POA: Diagnosis not present

## 2017-01-01 MED ORDER — FUROSEMIDE 40 MG TABLET
40.0000 mg | ORAL_TABLET | Freq: Two times a day (BID) | ORAL | 0 refills | Status: DC
Start: 2017-01-01 — End: 2017-08-10

## 2017-01-01 MED ORDER — SPIRONOLACTONE 50 MG TABLET
50.0000 mg | ORAL_TABLET | Freq: Every morning | ORAL | 0 refills | Status: DC
Start: 2017-01-01 — End: 2017-08-11

## 2017-01-01 NOTE — Progress Notes (Signed)
[facility] [place]  [address]  [address]  [place] [address]  [phone]    Date: 01/01/2017  Patient Name: Kathryn Reyes  MRN#: [id number]  DOB: Jun 04, 1928    Provider: Lenda Kelp, MD  PCP: Rayburn Go, MD    Reason for visit: Arrhythmia    History:   Kathryn Reyes is a 83 y.o. female with a history of AFib and diastolic CHF. She was last seen on 11/05/16 when she had continued, severe LE edema. She was started on Aldactone 25 mg daily. BMP and BNP were ordered. A venous reflux study was ordered and completed on 11/12/16 showing mild insufficiency of the right CFV, right GSV at [facility], and left GSV at [facility].     Today she reports continued LE edema and her BP is still running high. Otherwise, she has no c/o any CP, SOB, palpitations, syncope/presyncope, orthopnea/PND, or claudication.  She did not have the blood work done.    Past Medical History:     Past Medical History:   Diagnosis Date   . Arthritis    . Atrial fibrillation (HCC)    . Congestive heart failure (CHF) (HCC)    . Disorder of thyroid    . Dysphagia    . Esophageal reflux    . Hernia, hiatal    . Hypertension    . Pulmonary embolism (HCC)    . Seasonal allergic rhinitis      Past Surgical History:     Past Surgical History:   Procedure Laterality Date   . COLONOSCOPY     . HX HYSTERECTOMY       Allergies:     Allergies   Allergen Reactions   . Azithromycin      Other reaction(s): Other (See Comments)   . Codeine      Other reaction(s): Other (See Comments)   . Erythromycin    . Iodine      Other reaction(s): Other (See Comments)   . Oxycodone-Acetaminophen      Other reaction(s): Other (See Comments)   . Penicillins      Other reaction(s): Other (See Comments)   . Sulfasalazine      Other reaction(s): Other (See Comments)   . Tramadol      Other reaction(s): Other (See Comments)       Medications:     Current Outpatient Prescriptions   Medication Sig   . amLODIPine (NORVASC) 5 mg Oral Tablet Take 5 mg by mouth Once a day    . furosemide (LASIX) 40 mg Oral Tablet Take 1 Tab (40 mg total) by mouth Twice daily   . gabapentin (NEURONTIN) 300 mg Oral Capsule Take 300 mg by mouth Twice daily   . losartan (COZAAR) 100 mg Oral Tablet Take 100 mg by mouth Once a day   . metoprolol (LOPRESSOR) 50 mg Oral Tablet Take 50 mg by mouth Twice daily   . pantoprazole (PROTONIX) 40 mg Oral Tablet, Delayed Release (E.C.) Take 40 mg by mouth Once a day   . spironolactone (ALDACTONE) 50 mg Oral Tablet Take 1 Tab (50 mg total) by mouth Every morning with breakfast   . warfarin (COUMADIN) 1 mg Oral Tablet Take 1 mg by mouth Once a day   . [DISCONTINUED] spironolactone (ALDACTONE) 25 mg Oral Tablet Take 1 Tab (25 mg total) by mouth Every morning with breakfast       Family History:     Family Medical History  Problem Relation (Age of Onset)    Cancer Brother    Stroke Mother        Social History:     Social History     Social History   . Marital status: Divorced     Spouse name: N/A   . Number of children: N/A   . Years of education: N/A     Occupational History   . retired      Social History Main Topics   . Smoking status: Former Smoker     Packs/day: 1.00     Types: Cigarettes     Quit date: 10/02/2001   . Smokeless tobacco: Never Used   . Alcohol use No   . Drug use: Not on file   . Sexual activity: Not on file     Other Topics Concern   . Not on file     Social History Narrative       Review of Systems:     All other systems were reviewed and are negative other than noted in the HPI.    Physical Exam:     Vitals:    01/01/17 1404   BP: (!) 156/81   Pulse: 77   Resp: 18   SpO2: 93%   Weight: 100.2 kg (221 lb)   Height: 1.6 m ( )     Body mass index is 39.15 kg/(m^2).    General:No acute distress, obese  HEENT: Moist mucous membranes  Neck: Supple, no JVD, no bruits  Heart/Cardiovascular:  Irregular  No murmurs, rubs, or gallops  2+ radial pulses  Unable to examine LE pulses due to edema  Respiratory/Lungs: Non-labored, clear to auscultation   Abdominal/Gastrointestinal: Soft, non-tender  Extremities: Warm and well-perfused, 4+ LE edema, pitting  Skin: No rash  Neurological: Alert and oriented x3, grossly nonfocal  Psych: Normal affect    Cardiovascular Workup:   ABI 11/21/04  Normal lower extremity ABIs with atherosclerotic plaque noted without hemodynamically significant stenosis    VQ scan 05/25/09  Low probability scan for emboli.    Echo 06/12/13  1. Mild LVH with preserved systolic function. EF 65% or greater without wall motion abnormalities or thinning. Milddecreased compliance.  2. Left atrium is top normal in size to mildly dilated.  3. The right ventricle is normal size, normal function.  4. The right atrium is normal in size.  5. Interatrial septum is unremarkable. No defects. No masses.  6. Aorta: Ascending aorta is normal.  7. Aortic valve is trileaflet. There is mild sclerosisof the valve. There is no significant stenosis. There istrace AI.  8. Mitral valve normal morphology and function. There isno MS. there is trace MR.  9. Tricuspid valve normal morphology with mild tricuspidinsufficiency. Estimated RV systolic pressure about 30 to 35 mmHg consistent with mild pulmonary hypertension.  10. Pulmonic valve normal function.  11. Pericardium normal    Nuclear stress test 08/25/14  Small fixed inferolateral defect. No significant ischemia with Lexiscan stress. Left ventricular function is normal. Low risk stress test.    Nuclear stress test 06/24/16  Single day rest stress vasodilator sestamibi SPECT perfusion study. There is a fixed apical perfusion defect which is likelysecondary to apical thinning/scar. There is no inducible ischemia.   Normal left ventricular size and function. Post-rest ejection fraction 68% Low risk    Echo 10/22/16  1. Normal left ventricular size though appears to be underfilling. LV Ejection Fraction is ~70 %. There is flattening of the interventricular septum in diastole,  consistent with right ventricular volume overload. Mild left ventriclar hypertrophy is present.  2. Total wall motion score is 1.00. There are no regional wall motion abnormalities.  3. Severely dilated right ventricle. RV systolic pressure is consistent with critical (near systemic) pulmonary hypertension.  4. Severely dilated left atrium.  5. Severely dilated right atrium.  6. There is mild primary mitral valve regurgitation.  7. Mild aortic valve regurgitation. No Aortic Valve Stenosis.  8. Mild pulmonary valve stenosis.  9. The aortic root is normal in size.  10. The inferior vena cava is dilated.  11. Coronary sinus measured 1.4 cm which is dilated indicating possible presence of left persistant superior vena cava    Venous reflux 11/12/16  Right CFV has mild insufficiency.  Right GSV HAS mild insufficiency at [facility].  Left GSV HAS mild insufficiency at [facility].  No DVT was visualized in both lower extremities arterial systems.    Assessment and Plan:     Kathryn Reyes is a 83 y.o. female with    Atrial fibrillation:   - CHA2DS2- VASc of 6-7 (HTN, age, DM, female, CHF, ?PAD)   - HR controlled  - continue BB for rate control   - continue Coumadin for stroke prophylaxis, INR followed by PCP    Pulmonary HTN: Echo 10/22/16 severely dilated right ventricle. RV systolic pressure is consistent with critical (near systemic) pulmonary hypertension.  - can consider evaluation with serologies, VQ scan, RHC, etc but I am not sure what difference this will make in her therapy at this time  - suspect she has untreated OSA   - no c/o overt SOB   - continue diuretics     LE edema: Venous reflux 11/12/16 mild insufficiency of the right CFV, right GSV at [facility], and left GSV at [facility]  - previously stopped Zaroxalyn given lightheadedness/dizziness  - increase Lasix to 40 mg BID   - increase aldactone to 50 mg qam   - she does not wish to wear compression stockings  - encouraged she elevate her legs at rest and massage them with coconut oil   - BMP/BNP prior to next visit     Diastolic CHF: Echo 10/5/14Mild LVH with preserved systolic function. EF 65% or greater without wall motion abnormalities or thinning. Milddecreased compliance. Echo 10/22/16 EF ~70%  - as above    Coronary artery status: Nuclear stress test 06/24/16 Single day rest stress vasodilator sestamibi SPECT perfusion study. There is a fixed apical perfusion defect which is likelysecondary to apical thinning/scar. There is no inducible ischemia. Normal left ventricular size and function. Post-rest ejection fraction 68% Low risk  - no c/o CP    ?PAD per [facility] note: ABIs 2006 normal   - no claudication     HTN with mild LVH: BP elevated today   - increase diuretics as above    Lipid status: FLP 08/26/14 total 227 trig 85 HDL 42 LDL 168  - not on a statin; encouraged heart healthy diet and exercise    ?OSA:   - she declined OSA testing today    Former smoker    Thank you for allowing me to participate in the care of your patient. Please feel free to contact me if there are further questions.     I am scribing for, and in the presence of Dr. Lenda Kelp, MD for services provided on 01/01/2017.  Marcelino Freestone, SCRIBE     Marcelino Freestone, SCRIBE  01/01/2017, 14:12    I personally performed  the services described in this documentation, as scribed in my presence, and it is both accurate and complete.    Lenda Kelp, MD    Lenda Kelp, MD  01/01/2017, 14:56

## 2017-01-12 DIAGNOSIS — Z7901 Long term (current) use of anticoagulants: Secondary | ICD-10-CM | POA: Diagnosis not present

## 2017-01-12 DIAGNOSIS — Z5181 Encounter for therapeutic drug level monitoring: Secondary | ICD-10-CM | POA: Diagnosis not present

## 2017-01-12 DIAGNOSIS — I482 Chronic atrial fibrillation: Secondary | ICD-10-CM | POA: Diagnosis not present

## 2017-01-29 DIAGNOSIS — R2689 Other abnormalities of gait and mobility: Secondary | ICD-10-CM | POA: Diagnosis not present

## 2017-01-29 DIAGNOSIS — R5381 Other malaise: Secondary | ICD-10-CM | POA: Diagnosis not present

## 2017-02-09 DIAGNOSIS — Z7901 Long term (current) use of anticoagulants: Secondary | ICD-10-CM | POA: Diagnosis not present

## 2017-02-09 DIAGNOSIS — I482 Chronic atrial fibrillation: Secondary | ICD-10-CM | POA: Diagnosis not present

## 2017-02-09 DIAGNOSIS — Z5181 Encounter for therapeutic drug level monitoring: Secondary | ICD-10-CM | POA: Diagnosis not present

## 2017-02-11 DIAGNOSIS — M79604 Pain in right leg: Secondary | ICD-10-CM | POA: Diagnosis not present

## 2017-02-11 DIAGNOSIS — M79605 Pain in left leg: Secondary | ICD-10-CM | POA: Diagnosis not present

## 2017-02-11 DIAGNOSIS — R262 Difficulty in walking, not elsewhere classified: Secondary | ICD-10-CM | POA: Diagnosis not present

## 2017-02-13 DIAGNOSIS — M79604 Pain in right leg: Secondary | ICD-10-CM | POA: Diagnosis not present

## 2017-02-13 DIAGNOSIS — M79605 Pain in left leg: Secondary | ICD-10-CM | POA: Diagnosis not present

## 2017-02-13 DIAGNOSIS — R262 Difficulty in walking, not elsewhere classified: Secondary | ICD-10-CM | POA: Diagnosis not present

## 2017-02-17 DIAGNOSIS — M79604 Pain in right leg: Secondary | ICD-10-CM | POA: Diagnosis not present

## 2017-02-17 DIAGNOSIS — M79605 Pain in left leg: Secondary | ICD-10-CM | POA: Diagnosis not present

## 2017-02-17 DIAGNOSIS — R262 Difficulty in walking, not elsewhere classified: Secondary | ICD-10-CM | POA: Diagnosis not present

## 2017-02-19 DIAGNOSIS — R262 Difficulty in walking, not elsewhere classified: Secondary | ICD-10-CM | POA: Diagnosis not present

## 2017-02-19 DIAGNOSIS — M79604 Pain in right leg: Secondary | ICD-10-CM | POA: Diagnosis not present

## 2017-02-19 DIAGNOSIS — M79605 Pain in left leg: Secondary | ICD-10-CM | POA: Diagnosis not present

## 2017-02-24 DIAGNOSIS — R262 Difficulty in walking, not elsewhere classified: Secondary | ICD-10-CM | POA: Diagnosis not present

## 2017-02-24 DIAGNOSIS — M79604 Pain in right leg: Secondary | ICD-10-CM | POA: Diagnosis not present

## 2017-02-24 DIAGNOSIS — M79605 Pain in left leg: Secondary | ICD-10-CM | POA: Diagnosis not present

## 2017-02-27 DIAGNOSIS — L03116 Cellulitis of left lower limb: Secondary | ICD-10-CM | POA: Diagnosis not present

## 2017-02-27 DIAGNOSIS — R262 Difficulty in walking, not elsewhere classified: Secondary | ICD-10-CM | POA: Diagnosis not present

## 2017-02-27 DIAGNOSIS — M79604 Pain in right leg: Secondary | ICD-10-CM | POA: Diagnosis not present

## 2017-02-27 DIAGNOSIS — M79605 Pain in left leg: Secondary | ICD-10-CM | POA: Diagnosis not present

## 2017-03-03 DIAGNOSIS — M79605 Pain in left leg: Secondary | ICD-10-CM | POA: Diagnosis not present

## 2017-03-03 DIAGNOSIS — M79604 Pain in right leg: Secondary | ICD-10-CM | POA: Diagnosis not present

## 2017-03-03 DIAGNOSIS — R262 Difficulty in walking, not elsewhere classified: Secondary | ICD-10-CM | POA: Diagnosis not present

## 2017-03-04 ENCOUNTER — Ambulatory Visit
Admission: RE | Admit: 2017-03-04 | Discharge: 2017-03-04 | Disposition: A | Discharge status: Home / Self Care | Payer: Medicare (Managed Care) | Source: Ambulatory Visit | Attending: Cardiovascular Disease | Admitting: Cardiovascular Disease

## 2017-03-04 DIAGNOSIS — R600 Localized edema: Secondary | ICD-10-CM | POA: Diagnosis not present

## 2017-03-04 DIAGNOSIS — I5030 Unspecified diastolic (congestive) heart failure: Secondary | ICD-10-CM | POA: Diagnosis not present

## 2017-03-04 LAB — BASIC METABOLIC PANEL
Anion Gap: 14.1 mMol/L (ref 7.0–18.0)
BUN / Creatinine Ratio: 6.9 Ratio — ABNORMAL LOW (ref 10.0–30.0)
BUN: 10 mg/dL (ref 7–22)
CO2: 27.3 mMol/L (ref 20.0–30.0)
Calcium: 9.9 mg/dL (ref 8.5–10.5)
Chloride: 95 mMol/L — ABNORMAL LOW (ref 98–110)
Creatinine: 1.44 mg/dL — ABNORMAL HIGH (ref 0.60–1.20)
EGFR: 33 mL/min/{1.73_m2} — ABNORMAL LOW (ref 60–150)
Glucose: 97 mg/dL (ref 71–99)
Osmolality Calc: 263 mOsm/kg — ABNORMAL LOW (ref 275–300)
Potassium: 4.4 mMol/L (ref 3.5–5.3)
Sodium: 132 mMol/L — ABNORMAL LOW (ref 136–147)

## 2017-03-04 LAB — B-TYPE NATRIURETIC PEPTIDE: B-Natriuretic Peptide: 182.7 pg/mL — ABNORMAL HIGH (ref 0.0–100.0)

## 2017-03-05 ENCOUNTER — Encounter (INDEPENDENT_AMBULATORY_CARE_PROVIDER_SITE_OTHER): Payer: Self-pay | Admitting: Cardiovascular Disease

## 2017-03-05 DIAGNOSIS — M79605 Pain in left leg: Secondary | ICD-10-CM | POA: Diagnosis not present

## 2017-03-05 DIAGNOSIS — M79604 Pain in right leg: Secondary | ICD-10-CM | POA: Diagnosis not present

## 2017-03-05 DIAGNOSIS — R262 Difficulty in walking, not elsewhere classified: Secondary | ICD-10-CM | POA: Diagnosis not present

## 2017-03-05 NOTE — Nursing Note (Signed)
[facility] [place]  [address]  [address]  [place] [address]  [phone]    Date: 03/05/2017  Patient Name: Kathryn Reyes  MRN#: [id number]  DOB: [birth year]    Provider: Lenda Kelp, MD  PCP: Rayburn Go, MD      Reason for visit: No chief complaint on file.    History:     Kathryn Reyes is a 83 y.o. female with a history of AFib and diastolic CHF. She was last seen on 01/01/17 when she reported continued LE edema therefore her Lasix was increased to 40 mg BID. She also reported continued High BP so the Aldactone was increased to 50 mg QAM. BMP/BNP labs were ordered and not yet completed.       Physical Exam:   General:No acute distress, obese  HEENT: Moist mucous membranes  Neck: Supple, no JVD, no bruits  Heart/Cardiovascular:  Irregular  No murmurs, rubs, or gallops  2+ radial pulses  Unable to examine LE pulses due to edema  Respiratory/Lungs: Non-labored, clear to auscultation  Abdominal/Gastrointestinal: Soft, non-tender  Extremities: Warm and well-perfused, 4+ LE edema, pitting  Skin: No rash  Neurological: Alert and oriented x3, grossly nonfocal  Psych: Normal affect    Cardiovascular Workup:   ABI 11/21/04  Normal lower extremity ABIs with atherosclerotic plaque noted without hemodynamically significant stenosis    VQ scan 05/25/09  Low probability scan for emboli.    Echo 06/12/13  1. Mild LVH with preserved systolic function. EF 65% or greater without wall motion abnormalities or thinning. Milddecreased compliance.  2. Left atrium is top normal in size to mildly dilated.  3. The right ventricle is normal size, normal function.  4. The right atrium is normal in size.  5. Interatrial septum is unremarkable. No defects. No masses.  6. Aorta: Ascending aorta is normal.  7. Aortic valve is trileaflet. There is mild sclerosisof the valve. There is no significant stenosis. There istrace AI.  8. Mitral valve normal morphology and function. There isno MS.  there is trace MR.  9. Tricuspid valve normal morphology with mild tricuspidinsufficiency. Estimated RV systolic pressure about 30 to 35 mmHg consistent with mild pulmonary hypertension.  10. Pulmonic valve normal function.  11. Pericardium normal    Nuclear stress test 08/25/14  Small fixed inferolateral defect. No significant ischemia with Lexiscan stress. Left ventricular function is normal. Low risk stress test.    Nuclear stress test 06/24/16  Single day rest stress vasodilator sestamibi SPECT perfusion study. There is a fixed apical perfusion defect which is likelysecondary to apical thinning/scar. There is no inducible ischemia.   Normal left ventricular size and function. Post-rest ejection fraction 68% Low risk    Echo 10/22/16  1. Normal left ventricular size though appears to be underfilling. LV Ejection Fraction is ~70 %. There isflattening of the interventricular septum in diastole, consistent with right ventricular volume overload.Mild left ventriclar hypertrophy is present.  2. Total wall motion score is 1.00. There are no regional wall motion abnormalities.  3. Severely dilated right ventricle. RV systolic pressure is consistent with critical (near systemic)pulmonary hypertension.  4. Severely dilated left atrium.  5. Severely dilated right atrium.  6. There is mild primary mitral valve regurgitation.  7. Mild aortic valve regurgitation. No Aortic Valve Stenosis.  8. Mild pulmonary valve stenosis.  9. The aortic root is normal in size.  10. The inferior vena cava is dilated.  11. Coronary sinus measured 1.4 cm which is  dilated indicating possible presence of left persistantsuperior vena cava    Venous reflux 11/12/16  Right CFV has mild insufficiency.  [facility]  [facility]  No DVT was visualized in both lower extremities arterial systems.      Assessment and Plan:     Kathryn Reyes is a 83 y.o. female with      Atrial  fibrillation:   - CHA2DS2- VASc of 6-7 (HTN, age, DM, female, CHF, ?PAD)   - continue Coumadin for stroke prophylaxis, INR followed by PCP    Pulmonary HTN: Echo 10/22/16 severely dilated right ventricle. RV systolic pressure is consistent with critical (near systemic) pulmonary hypertension.  - can consider evaluation with serologies, VQ scan, RHC, etc but I am not sure what difference this will make in her therapy at this time    LE edema: Venous reflux 11/12/16 mild insufficiency of the right CFV[facility]  - previously stopped Zaroxalyn given lightheadedness/dizziness    Diastolic CHF: Echo 10/5/14Mild LVH with preserved systolic function. EF 65% or greater without wall motion abnormalities or thinning. Milddecreased compliance. Echo 10/22/16 EF ~70%    Coronary artery status: Nuclear stress test 06/24/16 Single day rest stress vasodilator sestamibi SPECT perfusion study. There is a fixed apical perfusion defect which is likelysecondary to apical thinning/scar. There is no inducible ischemia. Normal left ventricular size and function. Post-rest ejection fraction 68% Low risk    [facility]     HTN with mild LVH:    Lipid status: FLP 08/26/14 total 227 trig 85 HDL 42 LDL 168    ?OSA:     Former smoker

## 2017-03-09 ENCOUNTER — Encounter (INDEPENDENT_AMBULATORY_CARE_PROVIDER_SITE_OTHER): Payer: Self-pay | Admitting: Cardiovascular Disease

## 2017-03-09 ENCOUNTER — Ambulatory Visit (INDEPENDENT_AMBULATORY_CARE_PROVIDER_SITE_OTHER): Payer: Medicare PPO | Admitting: Cardiovascular Disease

## 2017-03-09 VITALS — BP 134/82 | HR 63 | Ht 63.0 in | Wt 184.0 lb

## 2017-03-09 DIAGNOSIS — I5032 Chronic diastolic (congestive) heart failure: Secondary | ICD-10-CM | POA: Diagnosis not present

## 2017-03-09 DIAGNOSIS — I482 Chronic atrial fibrillation: Secondary | ICD-10-CM | POA: Diagnosis not present

## 2017-03-09 DIAGNOSIS — Z7901 Long term (current) use of anticoagulants: Secondary | ICD-10-CM | POA: Diagnosis not present

## 2017-03-09 DIAGNOSIS — Z5181 Encounter for therapeutic drug level monitoring: Secondary | ICD-10-CM | POA: Diagnosis not present

## 2017-03-09 DIAGNOSIS — I480 Paroxysmal atrial fibrillation: Secondary | ICD-10-CM | POA: Diagnosis not present

## 2017-03-09 DIAGNOSIS — I10 Essential (primary) hypertension: Secondary | ICD-10-CM | POA: Diagnosis not present

## 2017-03-09 DIAGNOSIS — R600 Localized edema: Secondary | ICD-10-CM | POA: Diagnosis not present

## 2017-03-09 DIAGNOSIS — I272 Pulmonary hypertension, unspecified: Secondary | ICD-10-CM | POA: Diagnosis not present

## 2017-03-09 DIAGNOSIS — R69 Illness, unspecified: Secondary | ICD-10-CM

## 2017-03-09 NOTE — Progress Notes (Signed)
[facility] HEART & VASCULAR [facility]ST[facility] [place]  [address]  [address]  [place] [address]  [phone]    Date: 03/09/2017  Patient Name: Kathryn Reyes  MRN#: [id number]  DOB: Oct 05, 1927    Provider: Lenda Kelp, MD  PCP: Rayburn Go, MD      Reason for visit: Arrhythmia; Hypertension; CHF; and Shortness of Breath    History:     Ogechi Kuehnel is a 83 y.o. female with a history of AFib and diastolic CHF. She was last seen on 01/01/17 when she reported continued LE edema therefore her Lasix was increased to 40 mg BID. She also reported continued High BP so the Aldactone was increased to 50 mg QAM. BMP/BNP labs were ordered. Today she reports SOB but this is improved. She also has lost ~ 30 pounds since last visit and has less LE edema. Compared to her last visit she feels well. She also sees a physical therapist every week to work with her legs and this has been helping. She has occasional, fleeting right sided parasternal soreness but this is not exertional/anginal and is reproducible, longstanding and self-resolves. No c/o any palpitations, syncope/presyncope, orthopnea/PND, or claudication.    Past Medical History:     Past Medical History:   Diagnosis Date   . Arthritis    . Atrial fibrillation (CMS HCC)    . Congestive heart failure (CHF) (CMS HCC)    . Disorder of thyroid    . Dysphagia    . Esophageal reflux    . Hernia, hiatal    . Hypertension    . Pulmonary embolism (CMS HCC)    . Seasonal allergic rhinitis          Past Surgical History:     Past Surgical History:   Procedure Laterality Date   . COLONOSCOPY     . ELBOW SURGERY Left    . HX HYSTERECTOMY           Allergies:     Allergies   Allergen Reactions   . Azithromycin      Other reaction(s): Other (See Comments)   . Codeine      Other reaction(s): Other (See Comments)   . Erythromycin    . Iodine      Other reaction(s): Other (See Comments)   . Oxycodone-Acetaminophen      Other reaction(s): Other (See Comments)   . Penicillins      Other  reaction(s): Other (See Comments)   . Sulfasalazine      Other reaction(s): Other (See Comments)   . Tramadol      Other reaction(s): Other (See Comments)       Medications:     Current Outpatient Prescriptions   Medication Sig   . amLODIPine (NORVASC) 5 mg Oral Tablet Take 5 mg by mouth Once a day   . furosemide (LASIX) 40 mg Oral Tablet Take 1 Tab (40 mg total) by mouth Twice daily   . gabapentin (NEURONTIN) 300 mg Oral Capsule Take 300 mg by mouth Twice daily   . losartan (COZAAR) 100 mg Oral Tablet Take 100 mg by mouth Once a day   . metoprolol (LOPRESSOR) 50 mg Oral Tablet Take 50 mg by mouth Twice daily   . pantoprazole (PROTONIX) 40 mg Oral Tablet, Delayed Release (E.C.) Take 40 mg by mouth Once a day   . spironolactone (ALDACTONE) 50 mg Oral Tablet Take 1 Tab (50 mg total) by mouth Every morning with breakfast   . warfarin (  COUMADIN) 1 mg Oral Tablet Take 1 mg by mouth Once a day       Family History:     Family Medical History     Problem Relation (Age of Onset)    Cancer Brother    Stroke Mother              Social History:     Social History     Social History   . Marital status: Divorced     Spouse name: N/A   . Number of children: N/A   . Years of education: N/A     Occupational History   . retired      Social History Main Topics   . Smoking status: Former Smoker     Packs/day: 1.00     Types: Cigarettes     Quit date: 10/02/2001   . Smokeless tobacco: Never Used   . Alcohol use No   . Drug use: No   . Sexual activity: Not on file     Other Topics Concern   . Not on file     Social History Narrative       Review of Systems:     All other systems were reviewed and are negative other than noted in the HPI.  No bleeding issues.    Physical Exam:     Vitals:    03/09/17 1332   BP: 134/82   Pulse: 63   SpO2: 98%   Weight: 83.5 kg (184 lb)   Height: 1.6 m (5\' 3" )     Body mass index is 32.59 kg/(m^2).    General:No acute distress, obese  HEENT: Moist mucous membranes  Neck: Supple, no JVD, no bruits   Heart/Cardiovascular:  Irregular  No murmurs, rubs, or gallops  2+ radial pulses  Respiratory/Lungs: Non-labored, clear to auscultation  Abdominal/Gastrointestinal: Soft, non-tender  Extremities: Warm and well-perfused, 1-2+ LE edema, mildly pitting  Skin: No rash  Neurological: Alert and oriented x3, grossly nonfocal  Psych: Normal affect      Cardiovascular Workup:   ABI 11/21/04  Normal lower extremity ABIs with atherosclerotic plaque noted without hemodynamically significant stenosis    VQ scan 05/25/09  Low probability scan for emboli.    Echo 06/12/13  1. Mild LVH with preserved systolic function. EF 65% or greater without wall motion abnormalities or thinning. Milddecreased compliance.  2. Left atrium is top normal in size to mildly dilated.  3. The right ventricle is normal size, normal function.  4. The right atrium is normal in size.  5. Interatrial septum is unremarkable. No defects. No masses.  6. Aorta: Ascending aorta is normal.  7. Aortic valve is trileaflet. There is mild sclerosisof the valve. There is no significant stenosis. There istrace AI.  8. Mitral valve normal morphology and function. There isno MS. there is trace MR.  9. Tricuspid valve normal morphology with mild tricuspidinsufficiency. Estimated RV systolic pressure about 30 to 35 mmHg consistent with mild pulmonary hypertension.  10. Pulmonic valve normal function.  11. Pericardium normal    Nuclear stress test 08/25/14  Small fixed inferolateral defect. No significant ischemia with Lexiscan stress. Left ventricular function is normal. Low risk stress test.    Nuclear stress test 06/24/16  Single day rest stress vasodilator sestamibi SPECT perfusion study. There is a fixed apical perfusion defect which is likelysecondary to apical thinning/scar. There is no inducible ischemia.   Normal left ventricular size and function. Post-rest ejection fraction 68% Low risk  Echo 10/22/16  1. Normal left  ventricular size though appears to be underfilling. LV Ejection Fraction is ~70 %. There isflattening of the interventricular septum in diastole, consistent with right ventricular volume overload.Mild left ventriclar hypertrophy is present.  2. Total wall motion score is 1.00. There are no regional wall motion abnormalities.  3. Severely dilated right ventricle. RV systolic pressure is consistent with critical (near systemic)pulmonary hypertension.  4. Severely dilated left atrium.  5. Severely dilated right atrium.  6. There is mild primary mitral valve regurgitation.  7. Mild aortic valve regurgitation. No Aortic Valve Stenosis.  8. Mild pulmonary valve stenosis.  9. The aortic root is normal in size.  10. The inferior vena cava is dilated.  11. Coronary sinus measured 1.4 cm which is dilated indicating possible presence of left persistantsuperior vena cava    Venous reflux 11/12/16  Right CFV has mild insufficiency.  [facility]  [facility]  No DVT was visualized in both lower extremities arterial systems.    Assessment and Plan:     Fredonia HighlandHattie Lindvall is a 83 y.o. female with    Atrial fibrillation:   - CHA2DS2- VASc of 6-7 (HTN, age, DM, female, CHF, ?PAD)   - continue Coumadin for stroke prophylaxis, INRs followed by PCP    Pulmonary HTN: Echo 10/22/16 severely dilated right ventricle. RV systolic pressure is consistent with critical (near systemic) pulmonary hypertension.  - can consider evaluation with serologies, VQ scan, RHC, etc but I am not sure what difference this will make in her therapy at this time; fluid management is the main goal    LE edema: Venous reflux 11/12/16 mild insufficiency of the right CFV, [facility]  - likely a combination of CVI, pulm HTN, and possibly HFpEF  - previously stopped Zaroxalyn given lightheadedness/dizziness  - continue Lasix and Aldactone  - recommended to monitor her weight at home  (goal ~185 lbs)  - recommended getting her labs checked every 3 months due to the medications she is on  - Order BMP and B12 (per her request) to be done in about 3 months     Diastolic CHF: Echo 10/5/14Mild LVH with preserved systolic function. EF 65% or greater without wall motion abnormalities or thinning. Milddecreased compliance. Echo 10/22/16 EF ~70%  - last BNP only mildly elevated and improved    Coronary artery status: Nuclear stress test 06/24/16 Single day rest stress vasodilator sestamibi SPECT perfusion study. There is a fixed apical perfusion defect which is likelysecondary to apical thinning/scar. There is no inducible ischemia. Normal left ventricular size and function. Post-rest ejection fraction 68% Low risk  - no anginal CP    ?[facility] ABIs 2006 normal     HTN with mild LVH: BP controlled today    Lipid status: FLP 08/26/14 total 227 trig 85 HDL 42 LDL 168    Former smoker    Thank you for allowing me to participate in the care of your patient. Please feel free to contact me if there are further questions.     I am scribing for, and in the presence of Dr. Lenda Kelpavid C Tajuanna Burnett, MD for services provided on 03/09/2017.  Drinda Buttsaylor Marie Collins, SCRIBE   Drinda Buttsaylor Marie Collins, SCRIBE  03/09/2017, 13:36      I personally performed the services described in this documentation, as scribed in my presence, and it is both accurate and  complete.    Lenda Kelp, MD    Lenda Kelp, MD  03/09/2017, 14:06

## 2017-03-10 DIAGNOSIS — M79604 Pain in right leg: Secondary | ICD-10-CM | POA: Diagnosis not present

## 2017-03-10 DIAGNOSIS — R262 Difficulty in walking, not elsewhere classified: Secondary | ICD-10-CM | POA: Diagnosis not present

## 2017-03-10 DIAGNOSIS — M79605 Pain in left leg: Secondary | ICD-10-CM | POA: Diagnosis not present

## 2017-03-12 DIAGNOSIS — M79605 Pain in left leg: Secondary | ICD-10-CM | POA: Diagnosis not present

## 2017-03-12 DIAGNOSIS — R262 Difficulty in walking, not elsewhere classified: Secondary | ICD-10-CM | POA: Diagnosis not present

## 2017-03-12 DIAGNOSIS — M79604 Pain in right leg: Secondary | ICD-10-CM | POA: Diagnosis not present

## 2017-03-17 DIAGNOSIS — M79605 Pain in left leg: Secondary | ICD-10-CM | POA: Diagnosis not present

## 2017-03-17 DIAGNOSIS — M79604 Pain in right leg: Secondary | ICD-10-CM | POA: Diagnosis not present

## 2017-03-17 DIAGNOSIS — R262 Difficulty in walking, not elsewhere classified: Secondary | ICD-10-CM | POA: Diagnosis not present

## 2017-03-19 DIAGNOSIS — R531 Weakness: Secondary | ICD-10-CM | POA: Diagnosis not present

## 2017-03-19 DIAGNOSIS — M25512 Pain in left shoulder: Secondary | ICD-10-CM | POA: Diagnosis not present

## 2017-03-19 DIAGNOSIS — M79605 Pain in left leg: Secondary | ICD-10-CM | POA: Diagnosis not present

## 2017-03-19 DIAGNOSIS — R279 Unspecified lack of coordination: Secondary | ICD-10-CM | POA: Diagnosis not present

## 2017-03-19 DIAGNOSIS — M79604 Pain in right leg: Secondary | ICD-10-CM | POA: Diagnosis not present

## 2017-03-19 DIAGNOSIS — R262 Difficulty in walking, not elsewhere classified: Secondary | ICD-10-CM | POA: Diagnosis not present

## 2017-03-24 DIAGNOSIS — M79604 Pain in right leg: Secondary | ICD-10-CM | POA: Diagnosis not present

## 2017-03-24 DIAGNOSIS — R262 Difficulty in walking, not elsewhere classified: Secondary | ICD-10-CM | POA: Diagnosis not present

## 2017-03-24 DIAGNOSIS — R531 Weakness: Secondary | ICD-10-CM | POA: Diagnosis not present

## 2017-03-24 DIAGNOSIS — R279 Unspecified lack of coordination: Secondary | ICD-10-CM | POA: Diagnosis not present

## 2017-03-24 DIAGNOSIS — M79605 Pain in left leg: Secondary | ICD-10-CM | POA: Diagnosis not present

## 2017-03-24 DIAGNOSIS — M25512 Pain in left shoulder: Secondary | ICD-10-CM | POA: Diagnosis not present

## 2017-03-26 DIAGNOSIS — R279 Unspecified lack of coordination: Secondary | ICD-10-CM | POA: Diagnosis not present

## 2017-03-26 DIAGNOSIS — M25512 Pain in left shoulder: Secondary | ICD-10-CM | POA: Diagnosis not present

## 2017-03-26 DIAGNOSIS — M79604 Pain in right leg: Secondary | ICD-10-CM | POA: Diagnosis not present

## 2017-03-26 DIAGNOSIS — M79605 Pain in left leg: Secondary | ICD-10-CM | POA: Diagnosis not present

## 2017-03-26 DIAGNOSIS — R531 Weakness: Secondary | ICD-10-CM | POA: Diagnosis not present

## 2017-03-26 DIAGNOSIS — R262 Difficulty in walking, not elsewhere classified: Secondary | ICD-10-CM | POA: Diagnosis not present

## 2017-03-27 DIAGNOSIS — I10 Essential (primary) hypertension: Secondary | ICD-10-CM | POA: Diagnosis not present

## 2017-03-27 DIAGNOSIS — K219 Gastro-esophageal reflux disease without esophagitis: Secondary | ICD-10-CM | POA: Diagnosis not present

## 2017-03-27 DIAGNOSIS — J449 Chronic obstructive pulmonary disease, unspecified: Secondary | ICD-10-CM | POA: Diagnosis not present

## 2017-03-31 DIAGNOSIS — M25512 Pain in left shoulder: Secondary | ICD-10-CM | POA: Diagnosis not present

## 2017-03-31 DIAGNOSIS — R279 Unspecified lack of coordination: Secondary | ICD-10-CM | POA: Diagnosis not present

## 2017-03-31 DIAGNOSIS — M79605 Pain in left leg: Secondary | ICD-10-CM | POA: Diagnosis not present

## 2017-03-31 DIAGNOSIS — R531 Weakness: Secondary | ICD-10-CM | POA: Diagnosis not present

## 2017-03-31 DIAGNOSIS — R262 Difficulty in walking, not elsewhere classified: Secondary | ICD-10-CM | POA: Diagnosis not present

## 2017-03-31 DIAGNOSIS — M79604 Pain in right leg: Secondary | ICD-10-CM | POA: Diagnosis not present

## 2017-04-02 DIAGNOSIS — M25512 Pain in left shoulder: Secondary | ICD-10-CM | POA: Diagnosis not present

## 2017-04-02 DIAGNOSIS — R531 Weakness: Secondary | ICD-10-CM | POA: Diagnosis not present

## 2017-04-02 DIAGNOSIS — R279 Unspecified lack of coordination: Secondary | ICD-10-CM | POA: Diagnosis not present

## 2017-04-03 DIAGNOSIS — M79605 Pain in left leg: Secondary | ICD-10-CM | POA: Diagnosis not present

## 2017-04-03 DIAGNOSIS — R262 Difficulty in walking, not elsewhere classified: Secondary | ICD-10-CM | POA: Diagnosis not present

## 2017-04-03 DIAGNOSIS — M79604 Pain in right leg: Secondary | ICD-10-CM | POA: Diagnosis not present

## 2017-04-07 DIAGNOSIS — R279 Unspecified lack of coordination: Secondary | ICD-10-CM | POA: Diagnosis not present

## 2017-04-07 DIAGNOSIS — R531 Weakness: Secondary | ICD-10-CM | POA: Diagnosis not present

## 2017-04-07 DIAGNOSIS — M79605 Pain in left leg: Secondary | ICD-10-CM | POA: Diagnosis not present

## 2017-04-07 DIAGNOSIS — M25512 Pain in left shoulder: Secondary | ICD-10-CM | POA: Diagnosis not present

## 2017-04-07 DIAGNOSIS — M79604 Pain in right leg: Secondary | ICD-10-CM | POA: Diagnosis not present

## 2017-04-07 DIAGNOSIS — R262 Difficulty in walking, not elsewhere classified: Secondary | ICD-10-CM | POA: Diagnosis not present

## 2017-04-08 DIAGNOSIS — Z7901 Long term (current) use of anticoagulants: Secondary | ICD-10-CM | POA: Diagnosis not present

## 2017-04-08 DIAGNOSIS — I482 Chronic atrial fibrillation: Secondary | ICD-10-CM | POA: Diagnosis not present

## 2017-04-08 DIAGNOSIS — Z5181 Encounter for therapeutic drug level monitoring: Secondary | ICD-10-CM | POA: Diagnosis not present

## 2017-04-09 DIAGNOSIS — R279 Unspecified lack of coordination: Secondary | ICD-10-CM | POA: Diagnosis not present

## 2017-04-09 DIAGNOSIS — M25512 Pain in left shoulder: Secondary | ICD-10-CM | POA: Diagnosis not present

## 2017-04-09 DIAGNOSIS — R531 Weakness: Secondary | ICD-10-CM | POA: Diagnosis not present

## 2017-04-10 DIAGNOSIS — M79605 Pain in left leg: Secondary | ICD-10-CM | POA: Diagnosis not present

## 2017-04-10 DIAGNOSIS — R262 Difficulty in walking, not elsewhere classified: Secondary | ICD-10-CM | POA: Diagnosis not present

## 2017-04-10 DIAGNOSIS — M79604 Pain in right leg: Secondary | ICD-10-CM | POA: Diagnosis not present

## 2017-04-14 DIAGNOSIS — M79605 Pain in left leg: Secondary | ICD-10-CM | POA: Diagnosis not present

## 2017-04-14 DIAGNOSIS — M25512 Pain in left shoulder: Secondary | ICD-10-CM | POA: Diagnosis not present

## 2017-04-14 DIAGNOSIS — R279 Unspecified lack of coordination: Secondary | ICD-10-CM | POA: Diagnosis not present

## 2017-04-14 DIAGNOSIS — R262 Difficulty in walking, not elsewhere classified: Secondary | ICD-10-CM | POA: Diagnosis not present

## 2017-04-14 DIAGNOSIS — M79604 Pain in right leg: Secondary | ICD-10-CM | POA: Diagnosis not present

## 2017-04-14 DIAGNOSIS — R531 Weakness: Secondary | ICD-10-CM | POA: Diagnosis not present

## 2017-04-16 DIAGNOSIS — R531 Weakness: Secondary | ICD-10-CM | POA: Diagnosis not present

## 2017-04-16 DIAGNOSIS — M79604 Pain in right leg: Secondary | ICD-10-CM | POA: Diagnosis not present

## 2017-04-16 DIAGNOSIS — R262 Difficulty in walking, not elsewhere classified: Secondary | ICD-10-CM | POA: Diagnosis not present

## 2017-04-16 DIAGNOSIS — M25512 Pain in left shoulder: Secondary | ICD-10-CM | POA: Diagnosis not present

## 2017-04-16 DIAGNOSIS — M79605 Pain in left leg: Secondary | ICD-10-CM | POA: Diagnosis not present

## 2017-04-16 DIAGNOSIS — R279 Unspecified lack of coordination: Secondary | ICD-10-CM | POA: Diagnosis not present

## 2017-04-21 DIAGNOSIS — M79604 Pain in right leg: Secondary | ICD-10-CM | POA: Diagnosis not present

## 2017-04-21 DIAGNOSIS — R262 Difficulty in walking, not elsewhere classified: Secondary | ICD-10-CM | POA: Diagnosis not present

## 2017-04-21 DIAGNOSIS — M79605 Pain in left leg: Secondary | ICD-10-CM | POA: Diagnosis not present

## 2017-04-23 DIAGNOSIS — M79604 Pain in right leg: Secondary | ICD-10-CM | POA: Diagnosis not present

## 2017-04-23 DIAGNOSIS — R262 Difficulty in walking, not elsewhere classified: Secondary | ICD-10-CM | POA: Diagnosis not present

## 2017-04-23 DIAGNOSIS — M79605 Pain in left leg: Secondary | ICD-10-CM | POA: Diagnosis not present

## 2017-04-30 DIAGNOSIS — R531 Weakness: Secondary | ICD-10-CM | POA: Diagnosis not present

## 2017-04-30 DIAGNOSIS — R279 Unspecified lack of coordination: Secondary | ICD-10-CM | POA: Diagnosis not present

## 2017-04-30 DIAGNOSIS — M79604 Pain in right leg: Secondary | ICD-10-CM | POA: Diagnosis not present

## 2017-04-30 DIAGNOSIS — M25512 Pain in left shoulder: Secondary | ICD-10-CM | POA: Diagnosis not present

## 2017-04-30 DIAGNOSIS — M79605 Pain in left leg: Secondary | ICD-10-CM | POA: Diagnosis not present

## 2017-04-30 DIAGNOSIS — R262 Difficulty in walking, not elsewhere classified: Secondary | ICD-10-CM | POA: Diagnosis not present

## 2017-05-07 DIAGNOSIS — R531 Weakness: Secondary | ICD-10-CM | POA: Diagnosis not present

## 2017-05-07 DIAGNOSIS — R279 Unspecified lack of coordination: Secondary | ICD-10-CM | POA: Diagnosis not present

## 2017-05-07 DIAGNOSIS — M79604 Pain in right leg: Secondary | ICD-10-CM | POA: Diagnosis not present

## 2017-05-07 DIAGNOSIS — M25512 Pain in left shoulder: Secondary | ICD-10-CM | POA: Diagnosis not present

## 2017-05-07 DIAGNOSIS — M79605 Pain in left leg: Secondary | ICD-10-CM | POA: Diagnosis not present

## 2017-05-07 DIAGNOSIS — R262 Difficulty in walking, not elsewhere classified: Secondary | ICD-10-CM | POA: Diagnosis not present

## 2017-05-12 DIAGNOSIS — R262 Difficulty in walking, not elsewhere classified: Secondary | ICD-10-CM | POA: Diagnosis not present

## 2017-05-12 DIAGNOSIS — R531 Weakness: Secondary | ICD-10-CM | POA: Diagnosis not present

## 2017-05-12 DIAGNOSIS — M79604 Pain in right leg: Secondary | ICD-10-CM | POA: Diagnosis not present

## 2017-05-12 DIAGNOSIS — M25512 Pain in left shoulder: Secondary | ICD-10-CM | POA: Diagnosis not present

## 2017-05-12 DIAGNOSIS — R279 Unspecified lack of coordination: Secondary | ICD-10-CM | POA: Diagnosis not present

## 2017-05-12 DIAGNOSIS — M79605 Pain in left leg: Secondary | ICD-10-CM | POA: Diagnosis not present

## 2017-05-14 DIAGNOSIS — M79605 Pain in left leg: Secondary | ICD-10-CM | POA: Diagnosis not present

## 2017-05-14 DIAGNOSIS — M79604 Pain in right leg: Secondary | ICD-10-CM | POA: Diagnosis not present

## 2017-05-14 DIAGNOSIS — R262 Difficulty in walking, not elsewhere classified: Secondary | ICD-10-CM | POA: Diagnosis not present

## 2017-05-14 DIAGNOSIS — M25512 Pain in left shoulder: Secondary | ICD-10-CM | POA: Diagnosis not present

## 2017-05-14 DIAGNOSIS — R531 Weakness: Secondary | ICD-10-CM | POA: Diagnosis not present

## 2017-05-14 DIAGNOSIS — R279 Unspecified lack of coordination: Secondary | ICD-10-CM | POA: Diagnosis not present

## 2017-06-02 DIAGNOSIS — M79604 Pain in right leg: Secondary | ICD-10-CM | POA: Diagnosis not present

## 2017-06-02 DIAGNOSIS — R262 Difficulty in walking, not elsewhere classified: Secondary | ICD-10-CM | POA: Diagnosis not present

## 2017-06-02 DIAGNOSIS — M79605 Pain in left leg: Secondary | ICD-10-CM | POA: Diagnosis not present

## 2017-06-04 ENCOUNTER — Encounter (INDEPENDENT_AMBULATORY_CARE_PROVIDER_SITE_OTHER): Payer: Self-pay | Admitting: Cardiovascular Disease

## 2017-06-04 DIAGNOSIS — M79605 Pain in left leg: Secondary | ICD-10-CM | POA: Diagnosis not present

## 2017-06-04 DIAGNOSIS — M79604 Pain in right leg: Secondary | ICD-10-CM | POA: Diagnosis not present

## 2017-06-04 DIAGNOSIS — R262 Difficulty in walking, not elsewhere classified: Secondary | ICD-10-CM | POA: Diagnosis not present

## 2017-06-05 DIAGNOSIS — Z7901 Long term (current) use of anticoagulants: Secondary | ICD-10-CM | POA: Diagnosis not present

## 2017-06-05 DIAGNOSIS — I482 Chronic atrial fibrillation: Secondary | ICD-10-CM | POA: Diagnosis not present

## 2017-06-05 DIAGNOSIS — Z5181 Encounter for therapeutic drug level monitoring: Secondary | ICD-10-CM | POA: Diagnosis not present

## 2017-06-09 DIAGNOSIS — M79604 Pain in right leg: Secondary | ICD-10-CM | POA: Diagnosis not present

## 2017-06-09 DIAGNOSIS — M25512 Pain in left shoulder: Secondary | ICD-10-CM | POA: Diagnosis not present

## 2017-06-09 DIAGNOSIS — R531 Weakness: Secondary | ICD-10-CM | POA: Diagnosis not present

## 2017-06-09 DIAGNOSIS — R279 Unspecified lack of coordination: Secondary | ICD-10-CM | POA: Diagnosis not present

## 2017-06-09 DIAGNOSIS — R262 Difficulty in walking, not elsewhere classified: Secondary | ICD-10-CM | POA: Diagnosis not present

## 2017-06-09 DIAGNOSIS — M79605 Pain in left leg: Secondary | ICD-10-CM | POA: Diagnosis not present

## 2017-06-11 ENCOUNTER — Encounter (INDEPENDENT_AMBULATORY_CARE_PROVIDER_SITE_OTHER): Payer: Self-pay | Admitting: Cardiovascular Disease

## 2017-06-11 DIAGNOSIS — R262 Difficulty in walking, not elsewhere classified: Secondary | ICD-10-CM | POA: Diagnosis not present

## 2017-06-11 DIAGNOSIS — M79605 Pain in left leg: Secondary | ICD-10-CM | POA: Diagnosis not present

## 2017-06-11 DIAGNOSIS — M25512 Pain in left shoulder: Secondary | ICD-10-CM | POA: Diagnosis not present

## 2017-06-11 DIAGNOSIS — M79604 Pain in right leg: Secondary | ICD-10-CM | POA: Diagnosis not present

## 2017-06-11 DIAGNOSIS — R531 Weakness: Secondary | ICD-10-CM | POA: Diagnosis not present

## 2017-06-11 DIAGNOSIS — R279 Unspecified lack of coordination: Secondary | ICD-10-CM | POA: Diagnosis not present

## 2017-06-16 DIAGNOSIS — M79605 Pain in left leg: Secondary | ICD-10-CM | POA: Diagnosis not present

## 2017-06-16 DIAGNOSIS — M79604 Pain in right leg: Secondary | ICD-10-CM | POA: Diagnosis not present

## 2017-06-16 DIAGNOSIS — R262 Difficulty in walking, not elsewhere classified: Secondary | ICD-10-CM | POA: Diagnosis not present

## 2017-06-18 DIAGNOSIS — M79605 Pain in left leg: Secondary | ICD-10-CM | POA: Diagnosis not present

## 2017-06-18 DIAGNOSIS — R262 Difficulty in walking, not elsewhere classified: Secondary | ICD-10-CM | POA: Diagnosis not present

## 2017-06-18 DIAGNOSIS — R279 Unspecified lack of coordination: Secondary | ICD-10-CM | POA: Diagnosis not present

## 2017-06-18 DIAGNOSIS — M79604 Pain in right leg: Secondary | ICD-10-CM | POA: Diagnosis not present

## 2017-06-18 DIAGNOSIS — R531 Weakness: Secondary | ICD-10-CM | POA: Diagnosis not present

## 2017-06-18 DIAGNOSIS — M25512 Pain in left shoulder: Secondary | ICD-10-CM | POA: Diagnosis not present

## 2017-06-23 DIAGNOSIS — M79605 Pain in left leg: Secondary | ICD-10-CM | POA: Diagnosis not present

## 2017-06-23 DIAGNOSIS — M79604 Pain in right leg: Secondary | ICD-10-CM | POA: Diagnosis not present

## 2017-06-23 DIAGNOSIS — R279 Unspecified lack of coordination: Secondary | ICD-10-CM | POA: Diagnosis not present

## 2017-06-23 DIAGNOSIS — R531 Weakness: Secondary | ICD-10-CM | POA: Diagnosis not present

## 2017-06-23 DIAGNOSIS — R262 Difficulty in walking, not elsewhere classified: Secondary | ICD-10-CM | POA: Diagnosis not present

## 2017-06-23 DIAGNOSIS — M25512 Pain in left shoulder: Secondary | ICD-10-CM | POA: Diagnosis not present

## 2017-06-25 DIAGNOSIS — R262 Difficulty in walking, not elsewhere classified: Secondary | ICD-10-CM | POA: Diagnosis not present

## 2017-06-25 DIAGNOSIS — M79605 Pain in left leg: Secondary | ICD-10-CM | POA: Diagnosis not present

## 2017-06-25 DIAGNOSIS — M79604 Pain in right leg: Secondary | ICD-10-CM | POA: Diagnosis not present

## 2017-06-29 DIAGNOSIS — G608 Other hereditary and idiopathic neuropathies: Secondary | ICD-10-CM | POA: Diagnosis not present

## 2017-06-29 DIAGNOSIS — M5430 Sciatica, unspecified side: Secondary | ICD-10-CM | POA: Diagnosis not present

## 2017-06-30 DIAGNOSIS — R262 Difficulty in walking, not elsewhere classified: Secondary | ICD-10-CM | POA: Diagnosis not present

## 2017-06-30 DIAGNOSIS — M25512 Pain in left shoulder: Secondary | ICD-10-CM | POA: Diagnosis not present

## 2017-06-30 DIAGNOSIS — M79605 Pain in left leg: Secondary | ICD-10-CM | POA: Diagnosis not present

## 2017-06-30 DIAGNOSIS — M79604 Pain in right leg: Secondary | ICD-10-CM | POA: Diagnosis not present

## 2017-06-30 DIAGNOSIS — R531 Weakness: Secondary | ICD-10-CM | POA: Diagnosis not present

## 2017-06-30 DIAGNOSIS — R279 Unspecified lack of coordination: Secondary | ICD-10-CM | POA: Diagnosis not present

## 2017-07-01 DIAGNOSIS — R1314 Dysphagia, pharyngoesophageal phase: Secondary | ICD-10-CM | POA: Diagnosis not present

## 2017-07-01 DIAGNOSIS — H903 Sensorineural hearing loss, bilateral: Secondary | ICD-10-CM | POA: Diagnosis not present

## 2017-07-01 DIAGNOSIS — H6060 Unspecified chronic otitis externa, unspecified ear: Secondary | ICD-10-CM | POA: Diagnosis not present

## 2017-07-06 DIAGNOSIS — Z7901 Long term (current) use of anticoagulants: Secondary | ICD-10-CM | POA: Diagnosis not present

## 2017-07-06 DIAGNOSIS — Z5181 Encounter for therapeutic drug level monitoring: Secondary | ICD-10-CM | POA: Diagnosis not present

## 2017-07-06 DIAGNOSIS — I482 Chronic atrial fibrillation: Secondary | ICD-10-CM | POA: Diagnosis not present

## 2017-07-07 DIAGNOSIS — M79604 Pain in right leg: Secondary | ICD-10-CM | POA: Diagnosis not present

## 2017-07-07 DIAGNOSIS — M25512 Pain in left shoulder: Secondary | ICD-10-CM | POA: Diagnosis not present

## 2017-07-07 DIAGNOSIS — R531 Weakness: Secondary | ICD-10-CM | POA: Diagnosis not present

## 2017-07-07 DIAGNOSIS — M79605 Pain in left leg: Secondary | ICD-10-CM | POA: Diagnosis not present

## 2017-07-07 DIAGNOSIS — R262 Difficulty in walking, not elsewhere classified: Secondary | ICD-10-CM | POA: Diagnosis not present

## 2017-07-07 DIAGNOSIS — R279 Unspecified lack of coordination: Secondary | ICD-10-CM | POA: Diagnosis not present

## 2017-07-08 ENCOUNTER — Encounter (INDEPENDENT_AMBULATORY_CARE_PROVIDER_SITE_OTHER): Payer: Self-pay | Admitting: Cardiovascular Disease

## 2017-07-08 ENCOUNTER — Ambulatory Visit (INDEPENDENT_AMBULATORY_CARE_PROVIDER_SITE_OTHER): Payer: Medicare PPO | Admitting: Cardiovascular Disease

## 2017-07-08 VITALS — BP 131/75 | HR 65 | Ht 63.0 in | Wt 211.0 lb

## 2017-07-08 DIAGNOSIS — I480 Paroxysmal atrial fibrillation: Secondary | ICD-10-CM | POA: Diagnosis not present

## 2017-07-08 DIAGNOSIS — I5030 Unspecified diastolic (congestive) heart failure: Secondary | ICD-10-CM | POA: Diagnosis not present

## 2017-07-08 DIAGNOSIS — I272 Pulmonary hypertension, unspecified: Secondary | ICD-10-CM | POA: Diagnosis not present

## 2017-07-08 DIAGNOSIS — E7849 Other hyperlipidemia: Secondary | ICD-10-CM | POA: Diagnosis not present

## 2017-07-08 DIAGNOSIS — I10 Essential (primary) hypertension: Secondary | ICD-10-CM | POA: Diagnosis not present

## 2017-07-08 NOTE — Progress Notes (Signed)
[facility] HEART & VASCULAR INST[facility] [place]  [address]  [address]  [place] [address]  [phone]    Date: 07/08/2017  Patient Name: Kathryn Reyes  MRN#: [id number]  DOB: 04/20/1928    Provider: Lenda Kelp, MD  PCP: Rayburn Go, MD      Reason for visit: Arrhythmia; Hypertension; and CHF    History:     Kathryn Reyes is a 82 y.o. female with a history of AFib and diastolic CHF. She was last seen on 03/09/17 with no complaints and no changes were made. Today she states is feels fairly well, her continues to have stable DOE. She is walking more at home. No c/o any CP, palpitations, syncope/presyncope, orthopnea/PND, or claudication. She is making an appointment with an Endocrinologist for her thyroid disorder.  She has stable, mild LE edema.  She does not weigh herself at home.    Past Medical History:     Past Medical History:   Diagnosis Date   . Arthritis    . Atrial fibrillation (CMS HCC)    . Congestive heart failure (CHF) (CMS HCC)    . Disorder of thyroid    . Dysphagia    . Esophageal reflux    . Hernia, hiatal    . Hypertension    . Pulmonary embolism (CMS HCC)    . Seasonal allergic rhinitis          Past Surgical History:     Past Surgical History:   Procedure Laterality Date   . COLONOSCOPY     . ELBOW SURGERY Left    . HX HYSTERECTOMY           Allergies:     Allergies   Allergen Reactions   . Acetaminophen    . Amoxicillin    . Azithromycin      Other reaction(s): Other (See Comments)   . Codeine      Other reaction(s): Other (See Comments)   . Erythromycin    . Iodine      Other reaction(s): Other (See Comments)   . Oxycodone (Bulk)    . Oxycodone-Acetaminophen      Other reaction(s): Other (See Comments)   . Penicillins      Other reaction(s): Other (See Comments)   . Sulfasalazine      Other reaction(s): Other (See Comments)   . Tramadol      Other reaction(s): Other (See Comments)       Medications:     Current Outpatient Prescriptions   Medication Sig   . amLODIPine (NORVASC) 5 mg Oral  Tablet Take 5 mg by mouth Once a day   . cholecalciferol, vitamin D3, 1,000 unit Oral Tablet Take 2,000 Units by mouth Once a day   . furosemide (LASIX) 40 mg Oral Tablet Take 1 Tab (40 mg total) by mouth Twice daily   . gabapentin (NEURONTIN) 300 mg Oral Capsule Take 300 mg by mouth Twice daily   . levothyroxine (LEVOXYL) 88 mcg Oral Tablet Take 88 mcg by mouth Every morning   . losartan (COZAAR) 100 mg Oral Tablet Take 100 mg by mouth Once a day   . metoprolol (LOPRESSOR) 50 mg Oral Tablet Take 50 mg by mouth Twice daily   . nortriptyline (PAMELOR) 10 mg Oral Capsule Take 10 mg by mouth Every night   . pantoprazole (PROTONIX) 40 mg Oral Tablet, Delayed Release (E.C.) Take 40 mg by mouth Once a day   . spironolactone (ALDACTONE) 50 mg Oral Tablet  Take 1 Tab (50 mg total) by mouth Every morning with breakfast   . warfarin (COUMADIN) 1 mg Oral Tablet Take 1 mg by mouth Once a day Take 1- 4 days a week and 2 -3 days a week       Family History:     Family Medical History:     Problem Relation (Age of Onset)    Cancer Brother    Stroke Mother              Social History:     Social History     Social History   . Marital status: Divorced     Spouse name: N/A   . Number of children: N/A   . Years of education: N/A     Occupational History   . retired      Social History Main Topics   . Smoking status: Former Smoker     Packs/day: 1.00     Types: Cigarettes     Quit date: 10/02/2001   . Smokeless tobacco: Never Used   . Alcohol use No   . Drug use: No   . Sexual activity: Not on file     Other Topics Concern   . Not on file     Social History Narrative       Review of Systems:     All other systems were reviewed and are negative other than noted in the HPI.  No bleeding issues.    Physical Exam:     Vitals:    07/08/17 1337   BP: 131/75   Pulse: 65   SpO2: 97%   Weight: 95.7 kg (211 lb)   Height: 1.6 m (5\' 3" )     Body mass index is 37.38 kg/(m^2).    General:No acute distress, obese  HEENT: Moist mucous membranes  Neck:  Supple, no JVD, no bruits  Heart/Cardiovascular:  Irregular  No murmurs, rubs, or gallops  2+ radial pulses  Respiratory/Lungs: Non-labored, clear to auscultation  Abdominal/Gastrointestinal: Soft, non-tender  Extremities: Warm and well-perfused, 1+ LE edema, mildly pitting  Skin: No rash  Neurological: Alert and oriented x3, grossly nonfocal  Psych: Normal affect      Cardiovascular Workup:   ABI 11/21/04  Normal lower extremity ABIs with atherosclerotic plaque noted without hemodynamically significant stenosis    VQ scan 05/25/09  Low probability scan for emboli.    Echo 06/12/13  1. Mild LVH with preserved systolic function. EF 65% or greater without wall motion abnormalities or thinning. Milddecreased compliance.  2. Left atrium is top normal in size to mildly dilated.  3. The right ventricle is normal size, normal function.  4. The right atrium is normal in size.  5. Interatrial septum is unremarkable. No defects. No masses.  6. Aorta: Ascending aorta is normal.  7. Aortic valve is trileaflet. There is mild sclerosisof the valve. There is no significant stenosis. There istrace AI.  8. Mitral valve normal morphology and function. There isno MS. there is trace MR.  9. Tricuspid valve normal morphology with mild tricuspidinsufficiency. Estimated RV systolic pressure about 30 to 35 mmHg consistent with mild pulmonary hypertension.  10. Pulmonic valve normal function.  11. Pericardium normal    Nuclear stress test 08/25/14  Small fixed inferolateral defect. No significant ischemia with Lexiscan stress. Left ventricular function is normal. Low risk stress test.    Nuclear stress test 06/24/16  Single day rest stress vasodilator sestamibi SPECT perfusion study. There is a fixed apical perfusion  defect which is likelysecondary to apical thinning/scar. There is no inducible ischemia.   Normal left ventricular size and function. Post-rest ejection fraction 68% Low risk    Echo  10/22/16  1. Normal left ventricular size though appears to be underfilling. LV Ejection Fraction is ~70 %. There isflattening of the interventricular septum in diastole, consistent with right ventricular volume overload.Mild left ventriclar hypertrophy is present.  2. Total wall motion score is 1.00. There are no regional wall motion abnormalities.  3. Severely dilated right ventricle. RV systolic pressure is consistent with critical (near systemic)pulmonary hypertension.  4. Severely dilated left atrium.  5. Severely dilated right atrium.  6. There is mild primary mitral valve regurgitation.  7. Mild aortic valve regurgitation. No Aortic Valve Stenosis.  8. Mild pulmonary valve stenosis.  9. The aortic root is normal in size.  10. The inferior vena cava is dilated.  11. Coronary sinus measured 1.4 cm which is dilated indicating possible presence of left persistantsuperior vena cava    Venous reflux 11/12/16  Right CFV has mild insufficiency.  Right GSV HAS mild insufficiency at [facility].  Left GSV HAS mild insufficiency at [facility].  No DVT was visualized in both lower extremities arterial systems.    Assessment and Plan:     Kathryn Reyes is a 83 y.o. female with    Atrial fibrillation:   - CHA2DS2- VASc of 6-7 (HTN, age, DM, female, CHF, ?PAD)   - continue Coumadin for stroke prophylaxis, INRs followed by PCP    Pulmonary HTN: Echo 10/22/16 severely dilated right ventricle. RV systolic pressure is consistent with critical (near systemic) pulmonary hypertension.  - can consider evaluation with serologies, VQ scan, RHC, etc but I am not sure what difference this will make in her therapy at this time; fluid management is the main goal  - TTE prior to next visit to follow RV and PA pressures    LE edema: Venous reflux 11/12/16 mild insufficiency of the right CFV, right GSV at [facility], and left GSV at [facility]  -  likely a combination of CVI, pulm HTN, and possibly HFpEF  - previously stopped Zaroxalyn given lightheadedness/dizziness   - continue Lasix and Aldactone  - recommended to monitor her weight at home (goal ~185 lbs)  - BMP ordered today    Diastolic CHF: Echo 10/5/14Mild LVH with preserved systolic function. EF 65% or greater without wall motion abnormalities or thinning. Milddecreased compliance. Echo 10/22/16 EF ~70%  - stable SOB  - last BNP only mildly elevated and improved    Coronary artery status: Nuclear stress test 06/24/16 Single day rest stress vasodilator sestamibi SPECT perfusion study. There is a fixed apical perfusion defect which is likelysecondary to apical thinning/scar. There is no inducible ischemia. Normal left ventricular size and function. Post-rest ejection fraction 68% Low risk  - no CP    ?PAD per [facility] note: ABIs 2006 normal     HTN with mild LVH: BP controlled today    Lipid status: FLP 08/26/14 total 227 trig 85 HDL 42 LDL 168    Former smoker    Follow up in 6 months with echo done prior to visit    Thank you for allowing me to participate in the care of your patient. Please feel free to contact me if there are further questions.     I am scribing for, and in the presence of Dr. Lenda Kelp, MD for services provided on 07/08/2017.  Lowanda Foster, SCRIBE  Lowanda Foster, SCRIBE  07/08/2017, 13:54    I personally performed the services described in this documentation, as scribed in my presence, and it is both accurate and complete.    Lenda Kelpavid C Kazue Cerro, MD    Lenda Kelpavid C Guthrie Lemme, MD  07/08/2017, 14:09

## 2017-07-09 DIAGNOSIS — K219 Gastro-esophageal reflux disease without esophagitis: Secondary | ICD-10-CM | POA: Diagnosis not present

## 2017-07-09 DIAGNOSIS — I10 Essential (primary) hypertension: Secondary | ICD-10-CM | POA: Diagnosis not present

## 2017-07-09 DIAGNOSIS — I482 Chronic atrial fibrillation: Secondary | ICD-10-CM | POA: Diagnosis not present

## 2017-07-09 DIAGNOSIS — J449 Chronic obstructive pulmonary disease, unspecified: Secondary | ICD-10-CM | POA: Diagnosis not present

## 2017-07-14 DIAGNOSIS — M79604 Pain in right leg: Secondary | ICD-10-CM | POA: Diagnosis not present

## 2017-07-14 DIAGNOSIS — M79605 Pain in left leg: Secondary | ICD-10-CM | POA: Diagnosis not present

## 2017-07-14 DIAGNOSIS — M25512 Pain in left shoulder: Secondary | ICD-10-CM | POA: Diagnosis not present

## 2017-07-14 DIAGNOSIS — R262 Difficulty in walking, not elsewhere classified: Secondary | ICD-10-CM | POA: Diagnosis not present

## 2017-07-14 DIAGNOSIS — R279 Unspecified lack of coordination: Secondary | ICD-10-CM | POA: Diagnosis not present

## 2017-07-14 DIAGNOSIS — R531 Weakness: Secondary | ICD-10-CM | POA: Diagnosis not present

## 2017-07-16 DIAGNOSIS — M79605 Pain in left leg: Secondary | ICD-10-CM | POA: Diagnosis not present

## 2017-07-16 DIAGNOSIS — R262 Difficulty in walking, not elsewhere classified: Secondary | ICD-10-CM | POA: Diagnosis not present

## 2017-07-16 DIAGNOSIS — M79604 Pain in right leg: Secondary | ICD-10-CM | POA: Diagnosis not present

## 2017-07-20 ENCOUNTER — Encounter: Payer: Self-pay | Admitting: "Endocrinology

## 2017-07-20 DIAGNOSIS — E039 Hypothyroidism, unspecified: Secondary | ICD-10-CM | POA: Diagnosis not present

## 2017-07-20 DIAGNOSIS — I10 Essential (primary) hypertension: Secondary | ICD-10-CM | POA: Diagnosis not present

## 2017-07-20 DIAGNOSIS — E041 Nontoxic single thyroid nodule: Secondary | ICD-10-CM | POA: Diagnosis not present

## 2017-07-21 DIAGNOSIS — M79605 Pain in left leg: Secondary | ICD-10-CM | POA: Diagnosis not present

## 2017-07-21 DIAGNOSIS — R531 Weakness: Secondary | ICD-10-CM | POA: Diagnosis not present

## 2017-07-21 DIAGNOSIS — R262 Difficulty in walking, not elsewhere classified: Secondary | ICD-10-CM | POA: Diagnosis not present

## 2017-07-21 DIAGNOSIS — R279 Unspecified lack of coordination: Secondary | ICD-10-CM | POA: Diagnosis not present

## 2017-07-21 DIAGNOSIS — M79604 Pain in right leg: Secondary | ICD-10-CM | POA: Diagnosis not present

## 2017-07-21 DIAGNOSIS — M25512 Pain in left shoulder: Secondary | ICD-10-CM | POA: Diagnosis not present

## 2017-07-22 ENCOUNTER — Ambulatory Visit
Admission: RE | Admit: 2017-07-22 | Discharge: 2017-07-22 | Disposition: A | Discharge status: Home / Self Care | Payer: Medicare (Managed Care) | Source: Ambulatory Visit | Attending: "Endocrinology | Admitting: "Endocrinology

## 2017-07-22 DIAGNOSIS — E039 Hypothyroidism, unspecified: Secondary | ICD-10-CM | POA: Diagnosis not present

## 2017-07-22 DIAGNOSIS — E042 Nontoxic multinodular goiter: Secondary | ICD-10-CM | POA: Diagnosis not present

## 2017-07-23 DIAGNOSIS — M25512 Pain in left shoulder: Secondary | ICD-10-CM | POA: Diagnosis not present

## 2017-07-23 DIAGNOSIS — R279 Unspecified lack of coordination: Secondary | ICD-10-CM | POA: Diagnosis not present

## 2017-07-23 DIAGNOSIS — R531 Weakness: Secondary | ICD-10-CM | POA: Diagnosis not present

## 2017-07-28 DIAGNOSIS — M25512 Pain in left shoulder: Secondary | ICD-10-CM | POA: Diagnosis not present

## 2017-07-28 DIAGNOSIS — R262 Difficulty in walking, not elsewhere classified: Secondary | ICD-10-CM | POA: Diagnosis not present

## 2017-07-28 DIAGNOSIS — M79605 Pain in left leg: Secondary | ICD-10-CM | POA: Diagnosis not present

## 2017-07-28 DIAGNOSIS — M79604 Pain in right leg: Secondary | ICD-10-CM | POA: Diagnosis not present

## 2017-07-28 DIAGNOSIS — R279 Unspecified lack of coordination: Secondary | ICD-10-CM | POA: Diagnosis not present

## 2017-07-28 DIAGNOSIS — R531 Weakness: Secondary | ICD-10-CM | POA: Diagnosis not present

## 2017-08-04 DIAGNOSIS — M25512 Pain in left shoulder: Secondary | ICD-10-CM | POA: Diagnosis not present

## 2017-08-04 DIAGNOSIS — R531 Weakness: Secondary | ICD-10-CM | POA: Diagnosis not present

## 2017-08-04 DIAGNOSIS — R279 Unspecified lack of coordination: Secondary | ICD-10-CM | POA: Diagnosis not present

## 2017-08-05 DIAGNOSIS — I482 Chronic atrial fibrillation: Secondary | ICD-10-CM | POA: Diagnosis not present

## 2017-08-05 DIAGNOSIS — Z7901 Long term (current) use of anticoagulants: Secondary | ICD-10-CM | POA: Diagnosis not present

## 2017-08-05 DIAGNOSIS — Z5181 Encounter for therapeutic drug level monitoring: Secondary | ICD-10-CM | POA: Diagnosis not present

## 2017-08-06 DIAGNOSIS — R279 Unspecified lack of coordination: Secondary | ICD-10-CM | POA: Diagnosis not present

## 2017-08-06 DIAGNOSIS — R531 Weakness: Secondary | ICD-10-CM | POA: Diagnosis not present

## 2017-08-06 DIAGNOSIS — M25512 Pain in left shoulder: Secondary | ICD-10-CM | POA: Diagnosis not present

## 2017-08-10 ENCOUNTER — Other Ambulatory Visit (INDEPENDENT_AMBULATORY_CARE_PROVIDER_SITE_OTHER): Payer: Self-pay | Admitting: Cardiovascular Disease

## 2017-08-10 DIAGNOSIS — R600 Localized edema: Secondary | ICD-10-CM

## 2017-08-10 MED ORDER — FUROSEMIDE 40 MG TABLET
40.0000 mg | ORAL_TABLET | Freq: Two times a day (BID) | ORAL | 1 refills | Status: DC
Start: 2017-08-10 — End: 2017-12-24

## 2017-08-10 NOTE — Telephone Encounter (Signed)
Labs Reviewed   BASIC METABOLIC PANEL - Abnormal; Notable for the following:   Result Value   Glucose 102 (*)   BUN/Creatinine Ratio 7.5 (*)   EGFR 48 (*)   Osmolality Calc 274 (*)   All other components within normal limits   PT AND APTT - Abnormal; Notable for the following:   PT 29.1 (*)   PT INR 2.9 (*)   aPTT 39.4 (*)   All other components within normal limits   B-TYPE NATRIURETIC PEPTIDE - Abnormal; Notable for the following:   B-Natriuretic Peptide 265.5 (*)   All other components within normal limits   CBC AND DIFFERENTIAL   TROPONIN I   TROPONIN I     Radiology Results

## 2017-08-10 NOTE — Telephone Encounter (Signed)
Basic Metabolic NBVAP0/14/1030  [place]  Component Name Value Ref Range   Sodium 132 (L) 136 - 147 mMol/L    Potassium 4.4 3.5 - 5.3 mMol/L    Chloride 95 (L) 98 - 110 mMol/L    CO2 27.3 20.0 - 30.0 mMol/L    Calcium 9.9 8.5 - 10.5 mg/dL   Glucose 97 71 - 99 mg/dL   Creatinine 1.44 (H) 0.60 - 1.20 mg/dL   BUN 10 7 - 22 mg/dL   Anion Gap 14.1 7.0 - 18.0 mMol/L    BUN/Creatinine Ratio 6.9 (L) 10.0 - 30.0 Ratio    EGFR 33 (L)   Comment:  eGFR determined using CKD-EPI equation 60 - 150 mL/min/1.21m    Osmolality Calc 263 (L)   Comment:  The above 12 analytes were performed by [facility] ([phone]  [address],[place] [id number] - 300 mOsm/kg

## 2017-08-11 ENCOUNTER — Other Ambulatory Visit (INDEPENDENT_AMBULATORY_CARE_PROVIDER_SITE_OTHER): Payer: Self-pay | Admitting: Cardiovascular Disease

## 2017-08-11 DIAGNOSIS — R279 Unspecified lack of coordination: Secondary | ICD-10-CM | POA: Diagnosis not present

## 2017-08-11 DIAGNOSIS — M25512 Pain in left shoulder: Secondary | ICD-10-CM | POA: Diagnosis not present

## 2017-08-11 DIAGNOSIS — R531 Weakness: Secondary | ICD-10-CM | POA: Diagnosis not present

## 2017-08-11 DIAGNOSIS — I5032 Chronic diastolic (congestive) heart failure: Secondary | ICD-10-CM

## 2017-08-11 DIAGNOSIS — R600 Localized edema: Secondary | ICD-10-CM

## 2017-08-11 MED ORDER — SPIRONOLACTONE 50 MG TABLET
50.0000 mg | ORAL_TABLET | Freq: Every morning | ORAL | 0 refills | Status: DC
Start: 2017-08-11 — End: 2017-12-24

## 2017-08-12 ENCOUNTER — Other Ambulatory Visit (INDEPENDENT_AMBULATORY_CARE_PROVIDER_SITE_OTHER): Payer: Self-pay | Admitting: Cardiovascular Disease

## 2017-08-12 DIAGNOSIS — I10 Essential (primary) hypertension: Secondary | ICD-10-CM

## 2017-08-13 DIAGNOSIS — R279 Unspecified lack of coordination: Secondary | ICD-10-CM | POA: Diagnosis not present

## 2017-08-13 DIAGNOSIS — M25512 Pain in left shoulder: Secondary | ICD-10-CM | POA: Diagnosis not present

## 2017-08-13 DIAGNOSIS — R531 Weakness: Secondary | ICD-10-CM | POA: Diagnosis not present

## 2017-08-18 DIAGNOSIS — M25512 Pain in left shoulder: Secondary | ICD-10-CM | POA: Diagnosis not present

## 2017-08-18 DIAGNOSIS — R279 Unspecified lack of coordination: Secondary | ICD-10-CM | POA: Diagnosis not present

## 2017-08-18 DIAGNOSIS — R531 Weakness: Secondary | ICD-10-CM | POA: Diagnosis not present

## 2017-08-20 DIAGNOSIS — R531 Weakness: Secondary | ICD-10-CM | POA: Diagnosis not present

## 2017-08-20 DIAGNOSIS — M25512 Pain in left shoulder: Secondary | ICD-10-CM | POA: Diagnosis not present

## 2017-08-20 DIAGNOSIS — R279 Unspecified lack of coordination: Secondary | ICD-10-CM | POA: Diagnosis not present

## 2017-08-25 DIAGNOSIS — R279 Unspecified lack of coordination: Secondary | ICD-10-CM | POA: Diagnosis not present

## 2017-08-25 DIAGNOSIS — M25512 Pain in left shoulder: Secondary | ICD-10-CM | POA: Diagnosis not present

## 2017-08-25 DIAGNOSIS — R531 Weakness: Secondary | ICD-10-CM | POA: Diagnosis not present

## 2017-08-27 DIAGNOSIS — R279 Unspecified lack of coordination: Secondary | ICD-10-CM | POA: Diagnosis not present

## 2017-08-27 DIAGNOSIS — M25512 Pain in left shoulder: Secondary | ICD-10-CM | POA: Diagnosis not present

## 2017-08-27 DIAGNOSIS — R531 Weakness: Secondary | ICD-10-CM | POA: Diagnosis not present

## 2017-09-10 DIAGNOSIS — R531 Weakness: Secondary | ICD-10-CM | POA: Diagnosis not present

## 2017-09-10 DIAGNOSIS — M25512 Pain in left shoulder: Secondary | ICD-10-CM | POA: Diagnosis not present

## 2017-09-10 DIAGNOSIS — R279 Unspecified lack of coordination: Secondary | ICD-10-CM | POA: Diagnosis not present

## 2017-09-11 DIAGNOSIS — Z5181 Encounter for therapeutic drug level monitoring: Secondary | ICD-10-CM | POA: Diagnosis not present

## 2017-09-11 DIAGNOSIS — I482 Chronic atrial fibrillation: Secondary | ICD-10-CM | POA: Diagnosis not present

## 2017-09-11 DIAGNOSIS — Z791 Long term (current) use of non-steroidal anti-inflammatories (NSAID): Secondary | ICD-10-CM | POA: Diagnosis not present

## 2017-09-15 DIAGNOSIS — R279 Unspecified lack of coordination: Secondary | ICD-10-CM | POA: Diagnosis not present

## 2017-09-15 DIAGNOSIS — M25512 Pain in left shoulder: Secondary | ICD-10-CM | POA: Diagnosis not present

## 2017-09-15 DIAGNOSIS — R531 Weakness: Secondary | ICD-10-CM | POA: Diagnosis not present

## 2017-09-17 DIAGNOSIS — M25512 Pain in left shoulder: Secondary | ICD-10-CM | POA: Diagnosis not present

## 2017-09-17 DIAGNOSIS — R279 Unspecified lack of coordination: Secondary | ICD-10-CM | POA: Diagnosis not present

## 2017-09-17 DIAGNOSIS — R531 Weakness: Secondary | ICD-10-CM | POA: Diagnosis not present

## 2017-09-22 DIAGNOSIS — M25512 Pain in left shoulder: Secondary | ICD-10-CM | POA: Diagnosis not present

## 2017-09-22 DIAGNOSIS — R279 Unspecified lack of coordination: Secondary | ICD-10-CM | POA: Diagnosis not present

## 2017-09-22 DIAGNOSIS — R531 Weakness: Secondary | ICD-10-CM | POA: Diagnosis not present

## 2017-09-29 DIAGNOSIS — R531 Weakness: Secondary | ICD-10-CM | POA: Diagnosis not present

## 2017-09-29 DIAGNOSIS — M25512 Pain in left shoulder: Secondary | ICD-10-CM | POA: Diagnosis not present

## 2017-09-29 DIAGNOSIS — R279 Unspecified lack of coordination: Secondary | ICD-10-CM | POA: Diagnosis not present

## 2017-10-12 DIAGNOSIS — G629 Polyneuropathy, unspecified: Secondary | ICD-10-CM | POA: Diagnosis not present

## 2017-10-12 DIAGNOSIS — E039 Hypothyroidism, unspecified: Secondary | ICD-10-CM | POA: Diagnosis not present

## 2017-10-12 DIAGNOSIS — I10 Essential (primary) hypertension: Secondary | ICD-10-CM | POA: Diagnosis not present

## 2017-10-12 DIAGNOSIS — I482 Chronic atrial fibrillation: Secondary | ICD-10-CM | POA: Diagnosis not present

## 2017-10-12 DIAGNOSIS — Z5181 Encounter for therapeutic drug level monitoring: Secondary | ICD-10-CM | POA: Diagnosis not present

## 2017-10-12 DIAGNOSIS — Z7901 Long term (current) use of anticoagulants: Secondary | ICD-10-CM | POA: Diagnosis not present

## 2017-11-02 ENCOUNTER — Other Ambulatory Visit (INDEPENDENT_AMBULATORY_CARE_PROVIDER_SITE_OTHER): Payer: Self-pay

## 2017-11-10 ENCOUNTER — Inpatient Hospital Stay (INDEPENDENT_AMBULATORY_CARE_PROVIDER_SITE_OTHER)
Admission: RE | Admit: 2017-11-10 | Discharge: 2017-11-10 | Disposition: A | Discharge status: Home / Self Care | Payer: Medicare PPO | Source: Ambulatory Visit

## 2017-11-10 DIAGNOSIS — Z7901 Long term (current) use of anticoagulants: Secondary | ICD-10-CM | POA: Diagnosis not present

## 2017-11-10 DIAGNOSIS — Z5181 Encounter for therapeutic drug level monitoring: Secondary | ICD-10-CM | POA: Diagnosis not present

## 2017-11-10 DIAGNOSIS — I5030 Unspecified diastolic (congestive) heart failure: Secondary | ICD-10-CM | POA: Diagnosis not present

## 2017-11-10 DIAGNOSIS — I482 Chronic atrial fibrillation: Secondary | ICD-10-CM | POA: Diagnosis not present

## 2017-11-10 DIAGNOSIS — I361 Nonrheumatic tricuspid (valve) insufficiency: Secondary | ICD-10-CM | POA: Diagnosis not present

## 2017-11-26 DIAGNOSIS — M4606 Spinal enthesopathy, lumbar region: Secondary | ICD-10-CM | POA: Diagnosis not present

## 2017-11-26 DIAGNOSIS — M4316 Spondylolisthesis, lumbar region: Secondary | ICD-10-CM | POA: Diagnosis not present

## 2017-11-26 DIAGNOSIS — M9903 Segmental and somatic dysfunction of lumbar region: Secondary | ICD-10-CM | POA: Diagnosis not present

## 2017-12-02 DIAGNOSIS — I482 Chronic atrial fibrillation: Secondary | ICD-10-CM | POA: Diagnosis not present

## 2017-12-02 DIAGNOSIS — Z7901 Long term (current) use of anticoagulants: Secondary | ICD-10-CM | POA: Diagnosis not present

## 2017-12-02 DIAGNOSIS — Z5181 Encounter for therapeutic drug level monitoring: Secondary | ICD-10-CM | POA: Diagnosis not present

## 2017-12-23 DIAGNOSIS — Z5181 Encounter for therapeutic drug level monitoring: Secondary | ICD-10-CM | POA: Diagnosis not present

## 2017-12-23 DIAGNOSIS — I482 Chronic atrial fibrillation: Secondary | ICD-10-CM | POA: Diagnosis not present

## 2017-12-23 DIAGNOSIS — Z7901 Long term (current) use of anticoagulants: Secondary | ICD-10-CM | POA: Diagnosis not present

## 2017-12-24 ENCOUNTER — Ambulatory Visit (INDEPENDENT_AMBULATORY_CARE_PROVIDER_SITE_OTHER): Payer: Medicare PPO | Admitting: Cardiovascular Disease

## 2017-12-24 ENCOUNTER — Encounter (INDEPENDENT_AMBULATORY_CARE_PROVIDER_SITE_OTHER): Payer: Self-pay | Admitting: Cardiovascular Disease

## 2017-12-24 VITALS — BP 177/84 | HR 73 | Resp 20 | Ht 62.0 in | Wt 222.0 lb

## 2017-12-24 DIAGNOSIS — Z87891 Personal history of nicotine dependence: Secondary | ICD-10-CM | POA: Diagnosis not present

## 2017-12-24 DIAGNOSIS — I10 Essential (primary) hypertension: Secondary | ICD-10-CM | POA: Diagnosis not present

## 2017-12-24 DIAGNOSIS — I4891 Unspecified atrial fibrillation: Secondary | ICD-10-CM | POA: Diagnosis not present

## 2017-12-24 DIAGNOSIS — I5032 Chronic diastolic (congestive) heart failure: Secondary | ICD-10-CM | POA: Diagnosis not present

## 2017-12-24 DIAGNOSIS — I272 Pulmonary hypertension, unspecified: Secondary | ICD-10-CM | POA: Diagnosis not present

## 2017-12-24 DIAGNOSIS — R600 Localized edema: Secondary | ICD-10-CM | POA: Diagnosis not present

## 2017-12-24 DIAGNOSIS — I38 Endocarditis, valve unspecified: Secondary | ICD-10-CM | POA: Diagnosis not present

## 2017-12-24 DIAGNOSIS — I5030 Unspecified diastolic (congestive) heart failure: Secondary | ICD-10-CM | POA: Diagnosis not present

## 2017-12-24 MED ORDER — SPIRONOLACTONE 50 MG TABLET
50.00 mg | ORAL_TABLET | Freq: Every morning | ORAL | 0 refills | Status: DC
Start: 2017-12-24 — End: 2018-05-05

## 2017-12-24 MED ORDER — BUMETANIDE 1 MG TABLET: 1 mg | Tab | Freq: Two times a day (BID) | ORAL | 1 refills | 0 days | Status: DC

## 2017-12-24 NOTE — Patient Instructions (Signed)
Stop amlodipine    Stop Lasix    Start Bumex 1 mg twice a day. If weigh does not decrease in 1 week, increase to 2 mg twice a day

## 2017-12-24 NOTE — Progress Notes (Signed)
[facility] [place]  [address]  [address]  [place] [address]  [phone]    Date: 12/24/2017  Patient Name: Kathryn Reyes  MRN#: [id number]  DOB: 1928-03-26    Provider: Lenda Kelp, MD  PCP: Rayburn Go, MD      Reason for visit: Arrhythmia; CHF; and Follow-up After Testing    History:     Kathryn Reyes is a 84 y.o. female with a history of AFib, pulmonary HTN, and diastolic CHF. She was last seen on 07/08/17 when she reported stable DOE; an Echo was ordered and completed to reassess her RV and PA pressures.    Today she reports worsening SOB. Her LE edema fluctuates; she does not weigh herself daily at home. She ran out of her spironolactone last week. No c/o any CP, palpitations, syncope/presyncope, orthopnea/PND, or claudication.  INRs f/b PCP and therapeutic.  Home BPs run 120's systolic on meds per report.      Past Medical History:     Past Medical History:   Diagnosis Date   . Arthritis    . Atrial fibrillation (CMS HCC)    . Congestive heart failure (CHF) (CMS HCC)    . Disorder of thyroid    . Dysphagia    . Esophageal reflux    . Hernia, hiatal    . Hypertension    . Pulmonary embolism (CMS HCC)    . Seasonal allergic rhinitis          Past Surgical History:     Past Surgical History:   Procedure Laterality Date   . COLONOSCOPY     . ELBOW SURGERY Left    . HX HYSTERECTOMY           Allergies:     Allergies   Allergen Reactions   . Acetaminophen    . Amoxicillin    . Azithromycin      Other reaction(s): Other (See Comments)   . Codeine      Other reaction(s): Other (See Comments)   . Erythromycin    . Iodine      Other reaction(s): Other (See Comments)   . Oxycodone (Bulk)    . Oxycodone-Acetaminophen      Other reaction(s): Other (See Comments)   . Penicillins      Other reaction(s): Other (See Comments)   . Sulfasalazine      Other reaction(s): Other (See Comments)   . Tramadol      Other reaction(s): Other (See Comments)       Medications:     Current Outpatient  Medications   Medication Sig   . bumetanide (BUMEX) 1 mg Oral Tablet Take 1 Tab (1 mg total) by mouth Twice daily   . cholecalciferol, vitamin D3, 1,000 unit Oral Tablet Take 2,000 Units by mouth Once a day   . gabapentin (NEURONTIN) 300 mg Oral Capsule Take 300 mg by mouth Twice daily   . levothyroxine (LEVOXYL) 88 mcg Oral Tablet Take 88 mcg by mouth Every morning   . losartan (COZAAR) 100 mg Oral Tablet Take 100 mg by mouth Once a day   . metoprolol (LOPRESSOR) 50 mg Oral Tablet Take 50 mg by mouth Twice daily   . nortriptyline (PAMELOR) 10 mg Oral Capsule Take 10 mg by mouth Every night   . pantoprazole (PROTONIX) 40 mg Oral Tablet, Delayed Release (E.C.) Take 40 mg by mouth Once a day   . spironolactone (ALDACTONE) 50 mg Oral Tablet Take 1 Tab (  50 mg total) by mouth Every morning with breakfast   . warfarin (COUMADIN) 1 mg Oral Tablet Take 1 mg by mouth Once a day Take 1- 4 days a week and 2 -3 days a week       Family History:     Family Medical History:     Problem Relation (Age of Onset)    Cancer Brother    Stroke Mother              Social History:     Social History     Socioeconomic History   . Marital status: Divorced     Spouse name: Not on file   . Number of children: Not on file   . Years of education: Not on file   . Highest education level: Not on file   Occupational History   . Occupation: retired   Tobacco Use   . Smoking status: Former Smoker     Packs/day: 1.00     Types: Cigarettes     Last attempt to quit: 10/02/2001     Years since quitting: 16.2   . Smokeless tobacco: Never Used   Substance and Sexual Activity   . Alcohol use: No   . Drug use: No       Review of Systems:     All other systems were reviewed and are negative other than noted in the HPI.  No bleeding issues.    Physical Exam:       Vitals:    12/24/17 1449   BP: (!) 177/84   Pulse: 73   Resp: 20   SpO2: 99%   Weight: 100.7 kg (222 lb)   Height: 1.575 m (5\' 2" )   BMI: 40.69       General:No acute distress, obese  HEENT: Moist  mucous membranes  Neck: Supple, no JVD, no bruits  Heart/Cardiovascular:  Irregular  No murmurs, rubs, or gallops  2+ radial pulses  Respiratory/Lungs: Non-labored, clear to auscultation  Abdominal/Gastrointestinal: Soft, non-tender  Extremities: Warm and well-perfused, 3+ LE edema  Skin: No rash  Neurological: Alert and oriented x3, grossly nonfocal  Psych: Normal affect      Cardiovascular Workup:   ABI 11/21/04  Normal lower extremity ABIs with atherosclerotic plaque noted without hemodynamically significant stenosis    VQ scan 05/25/09  Low probability scan for emboli.    Echo 06/12/13  1. Mild LVH with preserved systolic function. EF 65% or greater without wall motion abnormalities or thinning. Milddecreased compliance.  2. Left atrium is top normal in size to mildly dilated.  3. The right ventricle is normal size, normal function.  4. The right atrium is normal in size.  5. Interatrial septum is unremarkable. No defects. No masses.  6. Aorta: Ascending aorta is normal.  7. Aortic valve is trileaflet. There is mild sclerosisof the valve. There is no significant stenosis. There istrace AI.  8. Mitral valve normal morphology and function. There isno MS. there is trace MR.  9. Tricuspid valve normal morphology with mild tricuspidinsufficiency. Estimated RV systolic pressure about 30 to 35 mmHg consistent with mild pulmonary hypertension.  10. Pulmonic valve normal function.  11. Pericardium normal    Nuclear stress test 08/25/14  Small fixed inferolateral defect. No significant ischemia with Lexiscan stress. Left ventricular function is normal. Low risk stress test.    Nuclear stress test 06/24/16  Single day rest stress vasodilator sestamibi SPECT perfusion study. There is a fixed apical perfusion defect which is likelysecondary  to apical thinning/scar. There is no inducible ischemia.   Normal left ventricular size and function. Post-rest ejection fraction 68% Low  risk    Echo 10/22/16  1. Normal left ventricular size though appears to be underfilling. LV Ejection Fraction is ~70 %. There isflattening of the interventricular septum in diastole, consistent with right ventricular volume overload.Mild left ventriclar hypertrophy is present.  2. Total wall motion score is 1.00. There are no regional wall motion abnormalities.  3. Severely dilated right ventricle. RV systolic pressure is consistent with critical (near systemic)pulmonary hypertension.  4. Severely dilated left atrium.  5. Severely dilated right atrium.  6. There is mild primary mitral valve regurgitation.  7. Mild aortic valve regurgitation. No Aortic Valve Stenosis.  8. Mild pulmonary valve stenosis.  9. The aortic root is normal in size.  10. The inferior vena cava is dilated.  11. Coronary sinus measured 1.4 cm which is dilated indicating possible presence of left persistantsuperior vena cava    Venous reflux 11/12/16  Right CFV has mild insufficiency.  Right GSV HAS mild insufficiency at [facility].  Left GSV HAS mild insufficiency at [facility].  No DVT was visualized in both lower extremities arterial systems.    Echo 11/10/17  1. LV Ejection Fraction is > 70 %. There is flattening of the interventricular septum in both systole and diastole, consistent with right ventricular pressure and volume overload.  2. Severely dilated right ventricle. RV systolic pressure is consistent with severe pulmonary hypertension.  3. There is severe tricuspid regurgitation.    Assessment and Plan:     Mikal Wisman is a 84 y.o. female with    Atrial fibrillation:   - EKG today shows rate controlled Afib  - CHA2DS2- VASc of 6-7 (HTN, age, DM, female, CHF, ?PAD)   - continue Coumadin for stroke prophylaxis, INRs followed by PCP (Dr. Fuller Song)  - continue metoprolol for rate control    Pulmonary HTN: Echo 10/22/16 severely dilated right ventricle. RV systolic pressure is consistent with critical (near systemic) pulmonary hypertension. Echo  11/10/17 71.0 mmHg. Severely dilated RV. Interventricular septum flattening in both systole and diasole. Severe TR.  - worsening SOB  - can consider evaluation with serologies, VQ scan, RHC, etc but I am not sure what difference this will make in her therapy at this time; fluid management is the main goal.  Discussed TVR, she is understandably not interested.  - has never been tested for OSA  - see below    Valvular disease/mild pulmonic valve stenosis/severe TR: Echo 10/22/16 mild pulmonic valve stenosis. Echo 11/10/17 severe TR. Moderate mitral annular calcification.  - volume control is mainstay of therapy    LE edema: Venous reflux 11/12/16 mild insufficiency of the right CFV, right GSV at [facility], and left GSV at [facility]  -  likely a combination of CVI, pulm HTN, and possibly HFpEF  - previously stopped Zaroxalyn given lightheadedness/dizziness  - continue Aldactone  - d/c Lasix; start Bumex 1 mg BID; advised to double to 2 mg BID if her weights do not go down in 1 week  - d/c amlodipine given LE edema  - recommended to monitor her weight at home  - order BMP to be done prior to follow up in 4 weeks    Coronary artery status: Nuclear stress test 06/24/16 Single day rest stress vasodilator sestamibi SPECT perfusion study. There is a fixed apical perfusion defect which is likelysecondary to apical thinning/scar. There is no inducible ischemia. Normal left ventricular size and  function. Post-rest ejection fraction 68% Low risk  - no CP    ?PAD [facility] note: ABIs 2006 normal     HTN with mild LVH: BP elevated today; reported to be controlled at home  - d/c amlodipine given LE edema, monitor at home  - switch Lasix to Bumex as above    Lipid status: FLP 08/26/14 total 227 trig 85 HDL 42 LDL 168    Former smoker      Thank you for allowing me to participate in the care of your patient. Please feel free to contact me if there are further questions.     I am scribing for, and in the presence of Dr. Lenda Kelpavid C Wilhelmena Zea, MD for  services provided on 12/24/2017.  [address] Hood    RienziSean Hood, [place]IBE  12/24/2017, 15:13    I personally performed the services described in this documentation, as scribed in my presence, and it is both accurate and complete.    Lenda Kelpavid C Francetta Ilg, MD    Lenda Kelpavid C Kripa Foskey, MD  12/24/2017, 15:19

## 2018-01-13 ENCOUNTER — Ambulatory Visit (INDEPENDENT_AMBULATORY_CARE_PROVIDER_SITE_OTHER): Payer: Self-pay | Admitting: Cardiovascular Disease

## 2018-01-13 DIAGNOSIS — Z7901 Long term (current) use of anticoagulants: Secondary | ICD-10-CM | POA: Diagnosis not present

## 2018-01-13 DIAGNOSIS — Z5181 Encounter for therapeutic drug level monitoring: Secondary | ICD-10-CM | POA: Diagnosis not present

## 2018-01-13 DIAGNOSIS — I482 Chronic atrial fibrillation: Secondary | ICD-10-CM | POA: Diagnosis not present

## 2018-01-13 NOTE — Telephone Encounter (Signed)
Patient had her daughter call to see if having numbness in her arms in the morning is a side effect of the bumex. Daughter does not believe it is the medication, but more that the patient is not eating well or drinking enough water. She also states she is not doing daily weights. Daughter is attempting to help patient. Reminded her of patient's appointment next Friday and to have lab work completed prior to this appointment and they could discuss with Dr. Orvis Brill the plan going forward.    No other needs at this time.    Angelene Giovanni, RN  01/13/2018, 10:16

## 2018-01-22 ENCOUNTER — Encounter (INDEPENDENT_AMBULATORY_CARE_PROVIDER_SITE_OTHER): Payer: Self-pay | Admitting: Cardiovascular Disease

## 2018-01-22 ENCOUNTER — Ambulatory Visit (INDEPENDENT_AMBULATORY_CARE_PROVIDER_SITE_OTHER): Payer: Medicare PPO | Admitting: Cardiovascular Disease

## 2018-01-22 VITALS — BP 130/71 | HR 64 | Resp 20 | Ht 62.0 in | Wt 211.0 lb

## 2018-01-22 DIAGNOSIS — I5030 Unspecified diastolic (congestive) heart failure: Secondary | ICD-10-CM | POA: Diagnosis not present

## 2018-01-22 DIAGNOSIS — I10 Essential (primary) hypertension: Secondary | ICD-10-CM | POA: Diagnosis not present

## 2018-01-22 DIAGNOSIS — Z87891 Personal history of nicotine dependence: Secondary | ICD-10-CM | POA: Diagnosis not present

## 2018-01-22 DIAGNOSIS — I272 Pulmonary hypertension, unspecified: Secondary | ICD-10-CM | POA: Diagnosis not present

## 2018-01-22 DIAGNOSIS — I4891 Unspecified atrial fibrillation: Secondary | ICD-10-CM | POA: Diagnosis not present

## 2018-01-22 DIAGNOSIS — R600 Localized edema: Secondary | ICD-10-CM | POA: Diagnosis not present

## 2018-01-22 DIAGNOSIS — E7849 Other hyperlipidemia: Secondary | ICD-10-CM | POA: Diagnosis not present

## 2018-01-22 DIAGNOSIS — I38 Endocarditis, valve unspecified: Secondary | ICD-10-CM | POA: Diagnosis not present

## 2018-01-22 NOTE — Progress Notes (Signed)
[facility] HEART & VASCULAR INST[facility] [place]  [address]  [address]  [place] [address]  [phone]    Date: 01/22/2018  Patient Name: Kathryn Reyes  MRN#: [id number]  DOB: 1928-05-22    Provider: Lenda Kelp, MD  PCP: Rayburn Go, MD      Reason for visit: Arrhythmia and CHF    History:     Latrease Kunde is a 84 y.o. female with a history of AFib, pulmonary HTN, and diastolic CHF. She was last seen on 12/24/17 when she had c/o worsened SOB and LE edema; her norvasc was dc'd and her Lasix was changed to Bumex. Labs were ordered to be done in 4 weeks. Today she states she has been feeling improved since her last visit. She has been working with physical therapy for the past few days and gets tired with this. She continues to have LE edema but this is improved and her weight is down. No c/o any CP, palpitations, syncope/presyncope, orthopnea/PND, or claudication.  DOE is stable.    Past Medical History:     Past Medical History:   Diagnosis Date   . Arthritis    . Atrial fibrillation (CMS HCC)    . Congestive heart failure (CHF) (CMS HCC)    . Disorder of thyroid    . Dysphagia    . Esophageal reflux    . Hernia, hiatal    . Hypertension    . Pulmonary embolism (CMS HCC)    . Seasonal allergic rhinitis          Past Surgical History:     Past Surgical History:   Procedure Laterality Date   . COLONOSCOPY     . ELBOW SURGERY Left    . HX HYSTERECTOMY           Allergies:     Allergies   Allergen Reactions   . Acetaminophen    . Amoxicillin    . Azithromycin      Other reaction(s): Other (See Comments)   . Codeine      Other reaction(s): Other (See Comments)   . Erythromycin    . Iodine      Other reaction(s): Other (See Comments)   . Oxycodone (Bulk)    . Oxycodone-Acetaminophen      Other reaction(s): Other (See Comments)   . Penicillins      Other reaction(s): Other (See Comments)   . Sulfasalazine      Other reaction(s): Other (See Comments)   . Tramadol      Other reaction(s): Other (See Comments)              Medications:     Current Outpatient Medications   Medication Sig   . bumetanide (BUMEX) 1 mg Oral Tablet Take 1 Tab (1 mg total) by mouth Twice daily   . cholecalciferol, vitamin D3, 1,000 unit Oral Tablet Take 2,000 Units by mouth Once a day   . gabapentin (NEURONTIN) 300 mg Oral Capsule Take 300 mg by mouth Twice daily   . levothyroxine (LEVOXYL) 88 mcg Oral Tablet Take 88 mcg by mouth Every morning   . losartan (COZAAR) 100 mg Oral Tablet Take 100 mg by mouth Once a day   . metoprolol (LOPRESSOR) 50 mg Oral Tablet Take 50 mg by mouth Twice daily   . nortriptyline (PAMELOR) 10 mg Oral Capsule Take 10 mg by mouth Every night   . pantoprazole (PROTONIX) 40 mg Oral Tablet, Delayed Release (E.C.) Take 40 mg by  mouth Once a day   . spironolactone (ALDACTONE) 50 mg Oral Tablet Take 1 Tab (50 mg total) by mouth Every morning with breakfast   . warfarin (COUMADIN) 1 mg Oral Tablet Take 1 mg by mouth Once a day Take 1- 4 days a week and 2 -3 days a week       Family History:     Family Medical History:     Problem Relation (Age of Onset)    Cancer Brother    Stroke Mother              Social History:     Social History     Socioeconomic History   . Marital status: Divorced     Spouse name: Not on file   . Number of children: Not on file   . Years of education: Not on file   . Highest education level: Not on file   Occupational History   . Occupation: retired   Tobacco Use   . Smoking status: Former Smoker     Packs/day: 1.00     Types: Cigarettes     Last attempt to quit: 10/02/2001     Years since quitting: 16.3   . Smokeless tobacco: Never Used   Substance and Sexual Activity   . Alcohol use: No   . Drug use: No       Review of Systems:     All other systems were reviewed and are negative other than noted in the HPI.  No bleeding issues.    Physical Exam:       Vitals:    01/22/18 1355   BP: 130/71   Pulse: 64   Resp: 20   SpO2: 93%   Weight: 95.7 kg (211 lb)   Height: 1.575 m ( )   BMI: 38.67       General:No  acute distress, obese  HEENT: Moist mucous membranes  Neck: Supple, no JVD, no bruits  Heart/Cardiovascular:  Irregular  No murmurs, rubs, or gallops  2+ radial pulses  Respiratory/Lungs: Non-labored, clear to auscultation  Abdominal/Gastrointestinal: Soft, non-tender  Extremities: Warm and well-perfused, 1-2+ LE edema  Skin: No rash  Neurological: Alert and oriented x3, grossly nonfocal  Psych: Normal affect      Cardiovascular Workup:   ABI 11/21/04  Normal lower extremity ABIs with atherosclerotic plaque noted without hemodynamically significant stenosis    VQ scan 05/25/09  Low probability scan for emboli.    Echo 06/12/13  1. Mild LVH with preserved systolic function. EF 65% or greater without wall motion abnormalities or thinning. Milddecreased compliance.  2. Left atrium is top normal in size to mildly dilated.  3. The right ventricle is normal size, normal function.  4. The right atrium is normal in size.  5. Interatrial septum is unremarkable. No defects. No masses.  6. Aorta: Ascending aorta is normal.  7. Aortic valve is trileaflet. There is mild sclerosisof the valve. There is no significant stenosis. There istrace AI.  8. Mitral valve normal morphology and function. There isno MS. there is trace MR.  9. Tricuspid valve normal morphology with mild tricuspidinsufficiency. Estimated RV systolic pressure about 30 to 35 mmHg consistent with mild pulmonary hypertension.  10. Pulmonic valve normal function.  11. Pericardium normal    Nuclear stress test 08/25/14  Small fixed inferolateral defect. No significant ischemia with Lexiscan stress. Left ventricular function is normal. Low risk stress test.    Nuclear stress test 06/24/16  Single day rest stress  vasodilator sestamibi SPECT perfusion study. There is a fixed apical perfusion defect which is likelysecondary to apical thinning/scar. There is no inducible ischemia.   Normal left ventricular size and function.  Post-rest ejection fraction 68% Low risk    Echo 10/22/16  1. Normal left ventricular size though appears to be underfilling. LV Ejection Fraction is ~70 %. There isflattening of the interventricular septum in diastole, consistent with right ventricular volume overload.Mild left ventriclar hypertrophy is present.  2. Total wall motion score is 1.00. There are no regional wall motion abnormalities.  3. Severely dilated right ventricle. RV systolic pressure is consistent with critical (near systemic)pulmonary hypertension.  4. Severely dilated left atrium.  5. Severely dilated right atrium.  6. There is mild primary mitral valve regurgitation.  7. Mild aortic valve regurgitation. No Aortic Valve Stenosis.  8. Mild pulmonary valve stenosis.  9. The aortic root is normal in size.  10. The inferior vena cava is dilated.  11. Coronary sinus measured 1.4 cm which is dilated indicating possible presence of left persistantsuperior vena cava    Venous reflux 11/12/16  Right CFV has mild insufficiency.  Right GSV HAS mild insufficiency at [facility].  Left GSV HAS mild insufficiency at [facility].  No DVT was visualized in both lower extremities arterial systems.    Echo 11/10/17  1. LV Ejection Fraction is > 70 %. There is flattening of the interventricular septum in both systole and diastole, consistent with right ventricular pressure and volume overload.  2. Severely dilated right ventricle. RV systolic pressure is consistent with severe pulmonary hypertension.  3. There is severe tricuspid regurgitation.    Assessment and Plan:     Krissy Orebaugh is a 84 y.o. female with    Atrial fibrillation:   - CHA2DS2- VASc of 6-7 (HTN, age, DM, female, CHF, ?PAD)   - continue Coumadin for stroke prophylaxis, INRs followed by PCP (Dr. Fuller Song)  - continue metoprolol for rate control    Pulmonary HTN: Echo 10/22/16 severely dilated right ventricle. RV systolic pressure is consistent with critical (near systemic) pulmonary hypertension. Echo  11/10/17 71.0 mmHg. Severely dilated RV. Interventricular septum flattening in both systole and diasole. Severe TR.  - can consider evaluation with serologies, VQ scan, RHC, etc but I am not sure what difference this will make in her therapy at this time; fluid management is the main goal.  Discussed TVR previously, she is understandably not interested.  - has never been tested for OSA, likely a contributing cause    Valvular disease/mild pulmonic valve stenosis/severe TR: Echo 10/22/16 mild pulmonic valve stenosis. Echo 11/10/17 severe TR. Moderate mitral annular calcification.  - volume control is mainstay of therapy    LE edema: Venous reflux 11/12/16 mild insufficiency of the right CFV, right GSV at [facility], and left GSV at [facility]  - weight down from previous visit  - likely a combination of CVI, pulm HTN, and possibly HFpEF  - previously stopped Zaroxalyn given lightheadedness/dizziness  - continue Aldactone, Bumex  - encouraged to take evening dose of Bumex in the afternoon rather than at night so she isn't up all night  - recommended to monitor her weight at home and take an extra dose of Bumex prn weight gain >2 lbs  - BMP to be done in 3 months    Coronary artery status: Nuclear stress test 06/24/16 Single day rest stress vasodilator sestamibi SPECT perfusion study. There is a fixed apical perfusion defect which is likelysecondary to apical thinning/scar. There is no  inducible ischemia. Normal left ventricular size and function. Post-rest ejection fraction 68% Low risk  - no CP    ?PAD per [facility] note: ABIs 2006 normal     HTN with mild LVH: controlled today  - continue current meds    Lipid status: FLP 08/26/14 total 227 trig 85 HDL 42 LDL 168    Former smoker      Thank you for allowing me to participate in the care of your patient. Please feel free to contact me if there are further questions.     I am scribing for, and in the presence of Dr. Lenda Kelp, MD for services provided on 01/22/2018.  Dustan DeMello,  SCRIBE    Dustan Demello, SCRIBE  01/22/2018, 14:13    I personally performed the services described in this documentation, as scribed in my presence, and it is both accurate and complete.    Lenda Kelp, MD    Lenda Kelp, MD  01/22/2018, 14:31

## 2018-02-10 DIAGNOSIS — M62830 Muscle spasm of back: Secondary | ICD-10-CM | POA: Diagnosis not present

## 2018-02-10 DIAGNOSIS — M9907 Segmental and somatic dysfunction of upper extremity: Secondary | ICD-10-CM | POA: Diagnosis not present

## 2018-02-10 DIAGNOSIS — M9903 Segmental and somatic dysfunction of lumbar region: Secondary | ICD-10-CM | POA: Diagnosis not present

## 2018-02-10 DIAGNOSIS — M9906 Segmental and somatic dysfunction of lower extremity: Secondary | ICD-10-CM | POA: Diagnosis not present

## 2018-02-10 DIAGNOSIS — M9901 Segmental and somatic dysfunction of cervical region: Secondary | ICD-10-CM | POA: Diagnosis not present

## 2018-02-10 DIAGNOSIS — M9902 Segmental and somatic dysfunction of thoracic region: Secondary | ICD-10-CM | POA: Diagnosis not present

## 2018-02-11 DIAGNOSIS — M9902 Segmental and somatic dysfunction of thoracic region: Secondary | ICD-10-CM | POA: Diagnosis not present

## 2018-02-11 DIAGNOSIS — M9903 Segmental and somatic dysfunction of lumbar region: Secondary | ICD-10-CM | POA: Diagnosis not present

## 2018-02-11 DIAGNOSIS — M9906 Segmental and somatic dysfunction of lower extremity: Secondary | ICD-10-CM | POA: Diagnosis not present

## 2018-02-11 DIAGNOSIS — M9901 Segmental and somatic dysfunction of cervical region: Secondary | ICD-10-CM | POA: Diagnosis not present

## 2018-02-11 DIAGNOSIS — M62830 Muscle spasm of back: Secondary | ICD-10-CM | POA: Diagnosis not present

## 2018-02-11 DIAGNOSIS — M9907 Segmental and somatic dysfunction of upper extremity: Secondary | ICD-10-CM | POA: Diagnosis not present

## 2018-02-12 DIAGNOSIS — Z5181 Encounter for therapeutic drug level monitoring: Secondary | ICD-10-CM | POA: Diagnosis not present

## 2018-02-12 DIAGNOSIS — Z7901 Long term (current) use of anticoagulants: Secondary | ICD-10-CM | POA: Diagnosis not present

## 2018-02-12 DIAGNOSIS — I482 Chronic atrial fibrillation: Secondary | ICD-10-CM | POA: Diagnosis not present

## 2018-02-15 DIAGNOSIS — M9901 Segmental and somatic dysfunction of cervical region: Secondary | ICD-10-CM | POA: Diagnosis not present

## 2018-02-15 DIAGNOSIS — M9902 Segmental and somatic dysfunction of thoracic region: Secondary | ICD-10-CM | POA: Diagnosis not present

## 2018-02-15 DIAGNOSIS — M9906 Segmental and somatic dysfunction of lower extremity: Secondary | ICD-10-CM | POA: Diagnosis not present

## 2018-02-15 DIAGNOSIS — M62830 Muscle spasm of back: Secondary | ICD-10-CM | POA: Diagnosis not present

## 2018-02-15 DIAGNOSIS — M9903 Segmental and somatic dysfunction of lumbar region: Secondary | ICD-10-CM | POA: Diagnosis not present

## 2018-02-15 DIAGNOSIS — M9907 Segmental and somatic dysfunction of upper extremity: Secondary | ICD-10-CM | POA: Diagnosis not present

## 2018-02-17 DIAGNOSIS — M62830 Muscle spasm of back: Secondary | ICD-10-CM | POA: Diagnosis not present

## 2018-02-17 DIAGNOSIS — M9907 Segmental and somatic dysfunction of upper extremity: Secondary | ICD-10-CM | POA: Diagnosis not present

## 2018-02-17 DIAGNOSIS — M9906 Segmental and somatic dysfunction of lower extremity: Secondary | ICD-10-CM | POA: Diagnosis not present

## 2018-02-17 DIAGNOSIS — M9902 Segmental and somatic dysfunction of thoracic region: Secondary | ICD-10-CM | POA: Diagnosis not present

## 2018-02-17 DIAGNOSIS — M9901 Segmental and somatic dysfunction of cervical region: Secondary | ICD-10-CM | POA: Diagnosis not present

## 2018-02-17 DIAGNOSIS — M9903 Segmental and somatic dysfunction of lumbar region: Secondary | ICD-10-CM | POA: Diagnosis not present

## 2018-02-25 DIAGNOSIS — M9903 Segmental and somatic dysfunction of lumbar region: Secondary | ICD-10-CM | POA: Diagnosis not present

## 2018-02-25 DIAGNOSIS — M9901 Segmental and somatic dysfunction of cervical region: Secondary | ICD-10-CM | POA: Diagnosis not present

## 2018-02-25 DIAGNOSIS — M9906 Segmental and somatic dysfunction of lower extremity: Secondary | ICD-10-CM | POA: Diagnosis not present

## 2018-02-25 DIAGNOSIS — M9907 Segmental and somatic dysfunction of upper extremity: Secondary | ICD-10-CM | POA: Diagnosis not present

## 2018-02-25 DIAGNOSIS — M9902 Segmental and somatic dysfunction of thoracic region: Secondary | ICD-10-CM | POA: Diagnosis not present

## 2018-02-25 DIAGNOSIS — M62830 Muscle spasm of back: Secondary | ICD-10-CM | POA: Diagnosis not present

## 2018-02-26 DIAGNOSIS — Z713 Dietary counseling and surveillance: Secondary | ICD-10-CM | POA: Diagnosis not present

## 2018-02-26 DIAGNOSIS — I872 Venous insufficiency (chronic) (peripheral): Secondary | ICD-10-CM | POA: Diagnosis not present

## 2018-02-26 DIAGNOSIS — R238 Other skin changes: Secondary | ICD-10-CM | POA: Diagnosis not present

## 2018-03-01 DIAGNOSIS — M62830 Muscle spasm of back: Secondary | ICD-10-CM | POA: Diagnosis not present

## 2018-03-01 DIAGNOSIS — M9907 Segmental and somatic dysfunction of upper extremity: Secondary | ICD-10-CM | POA: Diagnosis not present

## 2018-03-01 DIAGNOSIS — M9906 Segmental and somatic dysfunction of lower extremity: Secondary | ICD-10-CM | POA: Diagnosis not present

## 2018-03-01 DIAGNOSIS — M9903 Segmental and somatic dysfunction of lumbar region: Secondary | ICD-10-CM | POA: Diagnosis not present

## 2018-03-01 DIAGNOSIS — M9901 Segmental and somatic dysfunction of cervical region: Secondary | ICD-10-CM | POA: Diagnosis not present

## 2018-03-01 DIAGNOSIS — M9902 Segmental and somatic dysfunction of thoracic region: Secondary | ICD-10-CM | POA: Diagnosis not present

## 2018-03-03 DIAGNOSIS — M9907 Segmental and somatic dysfunction of upper extremity: Secondary | ICD-10-CM | POA: Diagnosis not present

## 2018-03-03 DIAGNOSIS — M62830 Muscle spasm of back: Secondary | ICD-10-CM | POA: Diagnosis not present

## 2018-03-03 DIAGNOSIS — M9906 Segmental and somatic dysfunction of lower extremity: Secondary | ICD-10-CM | POA: Diagnosis not present

## 2018-03-03 DIAGNOSIS — M9901 Segmental and somatic dysfunction of cervical region: Secondary | ICD-10-CM | POA: Diagnosis not present

## 2018-03-03 DIAGNOSIS — M9902 Segmental and somatic dysfunction of thoracic region: Secondary | ICD-10-CM | POA: Diagnosis not present

## 2018-03-03 DIAGNOSIS — M9903 Segmental and somatic dysfunction of lumbar region: Secondary | ICD-10-CM | POA: Diagnosis not present

## 2018-03-04 DIAGNOSIS — M9907 Segmental and somatic dysfunction of upper extremity: Secondary | ICD-10-CM | POA: Diagnosis not present

## 2018-03-04 DIAGNOSIS — M9903 Segmental and somatic dysfunction of lumbar region: Secondary | ICD-10-CM | POA: Diagnosis not present

## 2018-03-04 DIAGNOSIS — M9902 Segmental and somatic dysfunction of thoracic region: Secondary | ICD-10-CM | POA: Diagnosis not present

## 2018-03-04 DIAGNOSIS — M9901 Segmental and somatic dysfunction of cervical region: Secondary | ICD-10-CM | POA: Diagnosis not present

## 2018-03-04 DIAGNOSIS — M62830 Muscle spasm of back: Secondary | ICD-10-CM | POA: Diagnosis not present

## 2018-03-04 DIAGNOSIS — M9906 Segmental and somatic dysfunction of lower extremity: Secondary | ICD-10-CM | POA: Diagnosis not present

## 2018-03-08 DIAGNOSIS — M9906 Segmental and somatic dysfunction of lower extremity: Secondary | ICD-10-CM | POA: Diagnosis not present

## 2018-03-08 DIAGNOSIS — M9907 Segmental and somatic dysfunction of upper extremity: Secondary | ICD-10-CM | POA: Diagnosis not present

## 2018-03-08 DIAGNOSIS — M9901 Segmental and somatic dysfunction of cervical region: Secondary | ICD-10-CM | POA: Diagnosis not present

## 2018-03-08 DIAGNOSIS — M9903 Segmental and somatic dysfunction of lumbar region: Secondary | ICD-10-CM | POA: Diagnosis not present

## 2018-03-08 DIAGNOSIS — M9902 Segmental and somatic dysfunction of thoracic region: Secondary | ICD-10-CM | POA: Diagnosis not present

## 2018-03-08 DIAGNOSIS — M62830 Muscle spasm of back: Secondary | ICD-10-CM | POA: Diagnosis not present

## 2018-04-02 DIAGNOSIS — Z7901 Long term (current) use of anticoagulants: Secondary | ICD-10-CM | POA: Diagnosis not present

## 2018-04-02 DIAGNOSIS — I482 Chronic atrial fibrillation: Secondary | ICD-10-CM | POA: Diagnosis not present

## 2018-04-02 DIAGNOSIS — Z5181 Encounter for therapeutic drug level monitoring: Secondary | ICD-10-CM | POA: Diagnosis not present

## 2018-04-05 DIAGNOSIS — M9903 Segmental and somatic dysfunction of lumbar region: Secondary | ICD-10-CM | POA: Diagnosis not present

## 2018-04-05 DIAGNOSIS — M62830 Muscle spasm of back: Secondary | ICD-10-CM | POA: Diagnosis not present

## 2018-04-05 DIAGNOSIS — M9902 Segmental and somatic dysfunction of thoracic region: Secondary | ICD-10-CM | POA: Diagnosis not present

## 2018-04-05 DIAGNOSIS — M9901 Segmental and somatic dysfunction of cervical region: Secondary | ICD-10-CM | POA: Diagnosis not present

## 2018-04-05 DIAGNOSIS — M9906 Segmental and somatic dysfunction of lower extremity: Secondary | ICD-10-CM | POA: Diagnosis not present

## 2018-04-05 DIAGNOSIS — M9907 Segmental and somatic dysfunction of upper extremity: Secondary | ICD-10-CM | POA: Diagnosis not present

## 2018-04-07 DIAGNOSIS — M62830 Muscle spasm of back: Secondary | ICD-10-CM | POA: Diagnosis not present

## 2018-04-07 DIAGNOSIS — M9901 Segmental and somatic dysfunction of cervical region: Secondary | ICD-10-CM | POA: Diagnosis not present

## 2018-04-07 DIAGNOSIS — M9903 Segmental and somatic dysfunction of lumbar region: Secondary | ICD-10-CM | POA: Diagnosis not present

## 2018-04-07 DIAGNOSIS — M9902 Segmental and somatic dysfunction of thoracic region: Secondary | ICD-10-CM | POA: Diagnosis not present

## 2018-04-07 DIAGNOSIS — M9907 Segmental and somatic dysfunction of upper extremity: Secondary | ICD-10-CM | POA: Diagnosis not present

## 2018-04-07 DIAGNOSIS — M9906 Segmental and somatic dysfunction of lower extremity: Secondary | ICD-10-CM | POA: Diagnosis not present

## 2018-04-08 DIAGNOSIS — G629 Polyneuropathy, unspecified: Secondary | ICD-10-CM | POA: Diagnosis not present

## 2018-04-08 DIAGNOSIS — R609 Edema, unspecified: Secondary | ICD-10-CM | POA: Diagnosis not present

## 2018-04-08 DIAGNOSIS — N390 Urinary tract infection, site not specified: Secondary | ICD-10-CM | POA: Diagnosis not present

## 2018-04-08 DIAGNOSIS — M545 Low back pain: Secondary | ICD-10-CM | POA: Diagnosis not present

## 2018-04-08 DIAGNOSIS — W1830XA Fall on same level, unspecified, initial encounter: Secondary | ICD-10-CM | POA: Diagnosis not present

## 2018-04-08 DIAGNOSIS — W19XXXA Unspecified fall, initial encounter: Secondary | ICD-10-CM | POA: Diagnosis not present

## 2018-04-08 DIAGNOSIS — R32 Unspecified urinary incontinence: Secondary | ICD-10-CM | POA: Diagnosis not present

## 2018-04-08 DIAGNOSIS — G9009 Other idiopathic peripheral autonomic neuropathy: Secondary | ICD-10-CM | POA: Diagnosis not present

## 2018-04-12 DIAGNOSIS — M62830 Muscle spasm of back: Secondary | ICD-10-CM | POA: Diagnosis not present

## 2018-04-12 DIAGNOSIS — M9901 Segmental and somatic dysfunction of cervical region: Secondary | ICD-10-CM | POA: Diagnosis not present

## 2018-04-12 DIAGNOSIS — M9902 Segmental and somatic dysfunction of thoracic region: Secondary | ICD-10-CM | POA: Diagnosis not present

## 2018-04-12 DIAGNOSIS — M9906 Segmental and somatic dysfunction of lower extremity: Secondary | ICD-10-CM | POA: Diagnosis not present

## 2018-04-12 DIAGNOSIS — M9907 Segmental and somatic dysfunction of upper extremity: Secondary | ICD-10-CM | POA: Diagnosis not present

## 2018-04-12 DIAGNOSIS — M9903 Segmental and somatic dysfunction of lumbar region: Secondary | ICD-10-CM | POA: Diagnosis not present

## 2018-04-13 DIAGNOSIS — M71571 Other bursitis, not elsewhere classified, right ankle and foot: Secondary | ICD-10-CM | POA: Diagnosis not present

## 2018-04-16 DIAGNOSIS — Z7901 Long term (current) use of anticoagulants: Secondary | ICD-10-CM | POA: Diagnosis not present

## 2018-04-16 DIAGNOSIS — Z5181 Encounter for therapeutic drug level monitoring: Secondary | ICD-10-CM | POA: Diagnosis not present

## 2018-04-16 DIAGNOSIS — I480 Paroxysmal atrial fibrillation: Secondary | ICD-10-CM | POA: Diagnosis not present

## 2018-04-23 DIAGNOSIS — G603 Idiopathic progressive neuropathy: Secondary | ICD-10-CM | POA: Diagnosis not present

## 2018-04-26 DIAGNOSIS — R609 Edema, unspecified: Secondary | ICD-10-CM | POA: Diagnosis not present

## 2018-04-26 DIAGNOSIS — I5030 Unspecified diastolic (congestive) heart failure: Secondary | ICD-10-CM | POA: Diagnosis not present

## 2018-05-03 DIAGNOSIS — J449 Chronic obstructive pulmonary disease, unspecified: Secondary | ICD-10-CM | POA: Diagnosis not present

## 2018-05-03 DIAGNOSIS — I10 Essential (primary) hypertension: Secondary | ICD-10-CM | POA: Diagnosis not present

## 2018-05-03 DIAGNOSIS — R609 Edema, unspecified: Secondary | ICD-10-CM | POA: Diagnosis not present

## 2018-05-03 DIAGNOSIS — I5030 Unspecified diastolic (congestive) heart failure: Secondary | ICD-10-CM | POA: Diagnosis not present

## 2018-05-05 ENCOUNTER — Ambulatory Visit (INDEPENDENT_AMBULATORY_CARE_PROVIDER_SITE_OTHER): Payer: Medicare PPO | Admitting: Cardiovascular Disease

## 2018-05-05 ENCOUNTER — Encounter (INDEPENDENT_AMBULATORY_CARE_PROVIDER_SITE_OTHER): Payer: Self-pay | Admitting: Cardiovascular Disease

## 2018-05-05 VITALS — BP 190/96 | HR 67 | Resp 20 | Ht 63.0 in | Wt 218.0 lb

## 2018-05-05 DIAGNOSIS — M9901 Segmental and somatic dysfunction of cervical region: Secondary | ICD-10-CM | POA: Diagnosis not present

## 2018-05-05 DIAGNOSIS — M62830 Muscle spasm of back: Secondary | ICD-10-CM | POA: Diagnosis not present

## 2018-05-05 DIAGNOSIS — M9906 Segmental and somatic dysfunction of lower extremity: Secondary | ICD-10-CM | POA: Diagnosis not present

## 2018-05-05 DIAGNOSIS — I10 Essential (primary) hypertension: Secondary | ICD-10-CM | POA: Diagnosis not present

## 2018-05-05 DIAGNOSIS — R600 Localized edema: Secondary | ICD-10-CM | POA: Diagnosis not present

## 2018-05-05 DIAGNOSIS — E7849 Other hyperlipidemia: Secondary | ICD-10-CM | POA: Diagnosis not present

## 2018-05-05 DIAGNOSIS — Z87891 Personal history of nicotine dependence: Secondary | ICD-10-CM | POA: Diagnosis not present

## 2018-05-05 DIAGNOSIS — I5032 Chronic diastolic (congestive) heart failure: Secondary | ICD-10-CM | POA: Diagnosis not present

## 2018-05-05 DIAGNOSIS — M9907 Segmental and somatic dysfunction of upper extremity: Secondary | ICD-10-CM | POA: Diagnosis not present

## 2018-05-05 DIAGNOSIS — I38 Endocarditis, valve unspecified: Secondary | ICD-10-CM | POA: Diagnosis not present

## 2018-05-05 DIAGNOSIS — I4891 Unspecified atrial fibrillation: Secondary | ICD-10-CM | POA: Diagnosis not present

## 2018-05-05 DIAGNOSIS — I272 Pulmonary hypertension, unspecified: Secondary | ICD-10-CM | POA: Diagnosis not present

## 2018-05-05 DIAGNOSIS — M9903 Segmental and somatic dysfunction of lumbar region: Secondary | ICD-10-CM | POA: Diagnosis not present

## 2018-05-05 DIAGNOSIS — M9902 Segmental and somatic dysfunction of thoracic region: Secondary | ICD-10-CM | POA: Diagnosis not present

## 2018-05-05 DIAGNOSIS — I5030 Unspecified diastolic (congestive) heart failure: Secondary | ICD-10-CM

## 2018-05-05 MED ORDER — SPIRONOLACTONE 50 MG TABLET
50.00 mg | ORAL_TABLET | Freq: Every morning | ORAL | 1 refills | Status: AC
Start: 2018-05-05 — End: ?

## 2018-05-05 MED ORDER — LOSARTAN 100 MG TABLET
100.0000 mg | ORAL_TABLET | Freq: Every day | ORAL | 1 refills | Status: AC
Start: 2018-05-05 — End: ?

## 2018-05-05 MED ORDER — METOPROLOL TARTRATE 50 MG TABLET
50.00 mg | ORAL_TABLET | Freq: Two times a day (BID) | ORAL | 1 refills | Status: DC
Start: 2018-05-05 — End: 2019-02-16

## 2018-05-05 MED ORDER — BUMETANIDE 1 MG TABLET
1.00 mg | ORAL_TABLET | Freq: Two times a day (BID) | ORAL | 1 refills | Status: AC
Start: 2018-05-05 — End: ?

## 2018-05-05 NOTE — Progress Notes (Signed)
[facility] [place]  [address]  [address]  [place] [id number]  [phone]    Date: 05/05/2018  Patient Name: Kathryn HighlandHattie Hauger  MRN#: [id number]  DOB: 10/06/1927    Provider: Lenda Kelpavid C Arianie Couse, MD  PCP: Rayburn GoMark Schroeder, MD      Reason for visit: Arrhythmia    History:     Kathryn HighlandHattie Vanduyn is a 84 y.o. female with a history of AFib, pulmonary HTN, and diastolic CHF. She was last seen on 01/22/18 when she reported improved SOB and LE edema. A BMP was ordered.    Today, she reports feeling well from a cardiac standpoint with stable SOB.  Her BP is elevated in clinic (I rechecked it on the RA and it was 150/80); Rosey Batheresa reports that she is not able to check it consistently at home, but will try in the future.  She also states that Luetta NuttingHattie is not taking all of her meds regularly. EKG today shows A Fib. She has gained about 7 pounds of weight since her last visit in May. No c/o any CP, palpitations, syncope/presyncope, orthopnea/PND, or claudication.  She continues to have b/l LE edema and DOE.     Past Medical History:     Past Medical History:   Diagnosis Date   . Arthritis    . Atrial fibrillation (CMS HCC)    . Congestive heart failure (CHF) (CMS HCC)    . Disorder of thyroid    . Dysphagia    . Esophageal reflux    . Hernia, hiatal    . Hypertension    . Pulmonary embolism (CMS HCC)    . Seasonal allergic rhinitis          Past Surgical History:     Past Surgical History:   Procedure Laterality Date   . COLONOSCOPY     . ELBOW SURGERY Left    . HX HYSTERECTOMY           Allergies:     Allergies   Allergen Reactions   . Acetaminophen    . Amoxicillin    . Azithromycin      Other reaction(s): Other (See Comments)   . Codeine      Other reaction(s): Other (See Comments)   . Erythromycin    . Iodine      Other reaction(s): Other (See Comments)   . Oxycodone (Bulk)    . Oxycodone-Acetaminophen      Other reaction(s): Other (See Comments)   . Penicillins      Other reaction(s): Other (See Comments)   .  Sulfasalazine      Other reaction(s): Other (See Comments)   . Tramadol      Other reaction(s): Other (See Comments)       Medications:     Current Outpatient Medications   Medication Sig   . bumetanide (BUMEX) 1 mg Oral Tablet Take 1 Tab (1 mg total) by mouth Twice daily   . cholecalciferol, vitamin D3, 1,000 unit Oral Tablet Take 2,000 Units by mouth Once a day   . gabapentin (NEURONTIN) 300 mg Oral Capsule Take 300 mg by mouth Twice daily   . levothyroxine (LEVOXYL) 88 mcg Oral Tablet Take 88 mcg by mouth Every morning   . losartan (COZAAR) 100 mg Oral Tablet Take 1 Tab (100 mg total) by mouth Once a day   . metoprolol tartrate (LOPRESSOR) 50 mg Oral Tablet Take 1 Tab (50 mg total) by mouth Twice daily   .  nortriptyline (PAMELOR) 10 mg Oral Capsule Take 10 mg by mouth Every night   . pantoprazole (PROTONIX) 40 mg Oral Tablet, Delayed Release (E.C.) Take 40 mg by mouth Once a day   . spironolactone (ALDACTONE) 50 mg Oral Tablet Take 1 Tab (50 mg total) by mouth Every morning with breakfast   . warfarin (COUMADIN) 1 mg Oral Tablet Take 1 mg by mouth Once a day Take 1- 4 days a week and 2 -3 days a week       Family History:     Family Medical History:     Problem Relation (Age of Onset)    Cancer Brother    Stroke Mother              Social History:     Social History     Socioeconomic History   . Marital status: Divorced     Spouse name: Not on file   . Number of children: Not on file   . Years of education: Not on file   . Highest education level: Not on file   Occupational History   . Occupation: retired   Tobacco Use   . Smoking status: Former Smoker     Packs/day: 1.00     Types: Cigarettes     Last attempt to quit: 10/02/2001     Years since quitting: 16.6   . Smokeless tobacco: Never Used   Substance and Sexual Activity   . Alcohol use: No   . Drug use: No       Review of Systems:     All other systems were reviewed and are negative other than noted in the HPI.  No bleeding issues.    Physical Exam:              Vitals:    05/05/18 1548   BP: (!) 190/96   Pulse: 67   Resp: 20   SpO2: 92%   Weight: 98.9 kg (218 lb)   Height: 1.6 m (5\' 3" )   BMI: 38.7     BP recheck lower as above  General:No acute distress, obese  HEENT: Moist mucous membranes  Neck: Supple, +JVD, no bruits  Heart/Cardiovascular:  Irregular  No murmurs, rubs, or gallops  2+ radial pulses  Respiratory/Lungs: Non-labored, clear to auscultation  Abdominal/Gastrointestinal: Soft, non-tender  Extremities: Warm and well-perfused, 3+ LE edema  Skin: No rash  Neurological: Alert and oriented x3, grossly nonfocal  Psych: Normal affect      Cardiovascular Workup:   ABI 11/21/04  Normal lower extremity ABIs with atherosclerotic plaque noted without hemodynamically significant stenosis    VQ scan 05/25/09  Low probability scan for emboli.    Echo 06/12/13  1. Mild LVH with preserved systolic function. EF 65% or greater without wall motion abnormalities or thinning. Milddecreased compliance.  2. Left atrium is top normal in size to mildly dilated.  3. The right ventricle is normal size, normal function.  4. The right atrium is normal in size.  5. Interatrial septum is unremarkable. No defects. No masses.  6. Aorta: Ascending aorta is normal.  7. Aortic valve is trileaflet. There is mild sclerosisof the valve. There is no significant stenosis. There istrace AI.  8. Mitral valve normal morphology and function. There isno MS. there is trace MR.  9. Tricuspid valve normal morphology with mild tricuspidinsufficiency. Estimated RV systolic pressure about 30 to 35 mmHg consistent with mild pulmonary hypertension.  10. Pulmonic valve normal function.  11. Pericardium normal  Nuclear stress test 08/25/14  Small fixed inferolateral defect. No significant ischemia with Lexiscan stress. Left ventricular function is normal. Low risk stress test.    Nuclear stress test 06/24/16  Single day rest stress vasodilator sestamibi SPECT perfusion  study. There is a fixed apical perfusion defect which is likelysecondary to apical thinning/scar. There is no inducible ischemia.   Normal left ventricular size and function. Post-rest ejection fraction 68% Low risk    Echo 10/22/16  1. Normal left ventricular size though appears to be underfilling. LV Ejection Fraction is ~70 %. There isflattening of the interventricular septum in diastole, consistent with right ventricular volume overload.Mild left ventriclar hypertrophy is present.  2. Total wall motion score is 1.00. There are no regional wall motion abnormalities.  3. Severely dilated right ventricle. RV systolic pressure is consistent with critical (near systemic)pulmonary hypertension.  4. Severely dilated left atrium.  5. Severely dilated right atrium.  6. There is mild primary mitral valve regurgitation.  7. Mild aortic valve regurgitation. No Aortic Valve Stenosis.  8. Mild pulmonary valve stenosis.  9. The aortic root is normal in size.  10. The inferior vena cava is dilated.  11. Coronary sinus measured 1.4 cm which is dilated indicating possible presence of left persistantsuperior vena cava    Venous reflux 11/12/16  Right CFV has mild insufficiency.  Right [facility].  Left GSV HAS mild insufficiency at [facility].  No DVT was visualized in both lower extremities arterial systems.    Echo 11/10/17  1. LV Ejection Fraction is > 70 %. There is flattening of the interventricular septum in both systole and diastole, consistent with right ventricular pressure and volume overload.  2. Severely dilated right ventricle. RV systolic pressure is consistent with severe pulmonary hypertension.  3. There is severe tricuspid regurgitation.    EKG 05/05/18  A fib  Low voltage QRS    Assessment and Plan:     Eadie Repetto is a 84 y.o. female with    Atrial fibrillation:   - CHA2DS2- VASc of 6-7 (HTN, age, DM, female, CHF, ?PAD)   - continue Coumadin for stroke prophylaxis, INRs followed by PCP (Dr.  Janalyn Shy)  - continue metoprolol for rate control    LE edema: Venous reflux 11/12/16 mild insufficiency of the right CFV, right GSV at [facility], and left GSV at [facility]  - likely a combination of CVI, pulm HTN, and possibly HFpEF  - previously stopped Zaroxalyn given lightheadedness/dizziness  - continue Aldactone, Bumex  - recommended to monitor her weight at home and take an extra dose of Bumex prn weight gain >2 lbs  - BMP 04/08/18 stable, will recheck with BMP in 6 months    Pulmonary HTN: Echo 10/22/16 severely dilated right ventricle. RV systolic pressure is consistent with critical (near systemic) pulmonary hypertension. Echo 11/10/17 71.0 mmHg. Severely dilated RV. Interventricular septum flattening in both systole and diasole. Severe TR.  - can consider evaluation with serologies, VQ scan, RHC, etc but I am not sure what difference this will make in her therapy at this time; fluid management is the main goal.  Discussed TVR previously, she is understandably not interested.    Valvular disease/mild pulmonic valve stenosis/severe TR: Echo 10/22/16 mild pulmonic valve stenosis. Echo 11/10/17 severe TR. Moderate mitral annular calcification.  - volume control is mainstay of therapy    Coronary artery status: Nuclear stress test 06/24/16 Single day rest stress vasodilator sestamibi SPECT perfusion study. There is a fixed apical perfusion  defect which is likelysecondary to apical thinning/scar. There is no inducible ischemia. Normal left ventricular size and function. Post-rest ejection fraction 68% Low risk  - no CP    HTN with mild LVH: elevated today  - recommended that she check her BP at home 3-4 times per week and bring a log to the next visit  - continue current anti-hypertensive meds    Lipid status: FLP 10/12/17 Total 169, trig 84, HDL 64, LDL 108    Former smoker    Thank you for allowing me to participate in the care of your patient. Please feel free to contact me if there are further questions.     I am scribing  for, and in the presence of Dr. Lenda Kelp, MD for services provided on 05/05/2018.  Earna Coder Sites, SCRIBE    [place]xton, SCRIBE  05/05/2018, 16:03    I personally performed the services described in this documentation, as scribed in my presence, and it is both accurate and complete.    Lenda Kelp, MD    Lenda Kelp, MD  05/05/2018, 16:45

## 2018-05-06 DIAGNOSIS — M62830 Muscle spasm of back: Secondary | ICD-10-CM | POA: Diagnosis not present

## 2018-05-06 DIAGNOSIS — M9906 Segmental and somatic dysfunction of lower extremity: Secondary | ICD-10-CM | POA: Diagnosis not present

## 2018-05-06 DIAGNOSIS — M9902 Segmental and somatic dysfunction of thoracic region: Secondary | ICD-10-CM | POA: Diagnosis not present

## 2018-05-06 DIAGNOSIS — M9903 Segmental and somatic dysfunction of lumbar region: Secondary | ICD-10-CM | POA: Diagnosis not present

## 2018-05-06 DIAGNOSIS — M9907 Segmental and somatic dysfunction of upper extremity: Secondary | ICD-10-CM | POA: Diagnosis not present

## 2018-05-06 DIAGNOSIS — M9901 Segmental and somatic dysfunction of cervical region: Secondary | ICD-10-CM | POA: Diagnosis not present

## 2018-05-20 DIAGNOSIS — M9902 Segmental and somatic dysfunction of thoracic region: Secondary | ICD-10-CM | POA: Diagnosis not present

## 2018-05-20 DIAGNOSIS — M9906 Segmental and somatic dysfunction of lower extremity: Secondary | ICD-10-CM | POA: Diagnosis not present

## 2018-05-20 DIAGNOSIS — M9903 Segmental and somatic dysfunction of lumbar region: Secondary | ICD-10-CM | POA: Diagnosis not present

## 2018-05-20 DIAGNOSIS — M9907 Segmental and somatic dysfunction of upper extremity: Secondary | ICD-10-CM | POA: Diagnosis not present

## 2018-05-20 DIAGNOSIS — M9901 Segmental and somatic dysfunction of cervical region: Secondary | ICD-10-CM | POA: Diagnosis not present

## 2018-05-20 DIAGNOSIS — M62830 Muscle spasm of back: Secondary | ICD-10-CM | POA: Diagnosis not present

## 2018-05-25 DIAGNOSIS — G629 Polyneuropathy, unspecified: Secondary | ICD-10-CM | POA: Diagnosis not present

## 2018-05-25 DIAGNOSIS — R609 Edema, unspecified: Secondary | ICD-10-CM | POA: Diagnosis not present

## 2018-05-25 DIAGNOSIS — E039 Hypothyroidism, unspecified: Secondary | ICD-10-CM | POA: Diagnosis not present

## 2018-05-25 DIAGNOSIS — I872 Venous insufficiency (chronic) (peripheral): Secondary | ICD-10-CM | POA: Diagnosis not present

## 2018-05-25 DIAGNOSIS — G609 Hereditary and idiopathic neuropathy, unspecified: Secondary | ICD-10-CM | POA: Diagnosis not present

## 2018-05-25 DIAGNOSIS — R32 Unspecified urinary incontinence: Secondary | ICD-10-CM | POA: Diagnosis not present

## 2018-05-25 DIAGNOSIS — N39498 Other specified urinary incontinence: Secondary | ICD-10-CM | POA: Diagnosis not present

## 2018-05-27 DIAGNOSIS — M62830 Muscle spasm of back: Secondary | ICD-10-CM | POA: Diagnosis not present

## 2018-05-27 DIAGNOSIS — M9903 Segmental and somatic dysfunction of lumbar region: Secondary | ICD-10-CM | POA: Diagnosis not present

## 2018-05-27 DIAGNOSIS — M9901 Segmental and somatic dysfunction of cervical region: Secondary | ICD-10-CM | POA: Diagnosis not present

## 2018-05-27 DIAGNOSIS — M9907 Segmental and somatic dysfunction of upper extremity: Secondary | ICD-10-CM | POA: Diagnosis not present

## 2018-05-27 DIAGNOSIS — M9906 Segmental and somatic dysfunction of lower extremity: Secondary | ICD-10-CM | POA: Diagnosis not present

## 2018-05-27 DIAGNOSIS — M9902 Segmental and somatic dysfunction of thoracic region: Secondary | ICD-10-CM | POA: Diagnosis not present

## 2018-06-01 DIAGNOSIS — Z7901 Long term (current) use of anticoagulants: Secondary | ICD-10-CM | POA: Diagnosis not present

## 2018-06-01 DIAGNOSIS — I482 Chronic atrial fibrillation: Secondary | ICD-10-CM | POA: Diagnosis not present

## 2018-06-01 DIAGNOSIS — Z5181 Encounter for therapeutic drug level monitoring: Secondary | ICD-10-CM | POA: Diagnosis not present

## 2018-06-03 DIAGNOSIS — M9906 Segmental and somatic dysfunction of lower extremity: Secondary | ICD-10-CM | POA: Diagnosis not present

## 2018-06-03 DIAGNOSIS — M9907 Segmental and somatic dysfunction of upper extremity: Secondary | ICD-10-CM | POA: Diagnosis not present

## 2018-06-03 DIAGNOSIS — M9901 Segmental and somatic dysfunction of cervical region: Secondary | ICD-10-CM | POA: Diagnosis not present

## 2018-06-03 DIAGNOSIS — M9903 Segmental and somatic dysfunction of lumbar region: Secondary | ICD-10-CM | POA: Diagnosis not present

## 2018-06-03 DIAGNOSIS — M9902 Segmental and somatic dysfunction of thoracic region: Secondary | ICD-10-CM | POA: Diagnosis not present

## 2018-06-03 DIAGNOSIS — M62830 Muscle spasm of back: Secondary | ICD-10-CM | POA: Diagnosis not present

## 2018-06-10 ENCOUNTER — Ambulatory Visit
Admission: RE | Admit: 2018-06-10 | Discharge: 2018-06-10 | Disposition: A | Discharge status: Home / Self Care | Payer: Medicare (Managed Care) | Source: Ambulatory Visit | Attending: Emergency Medicine | Admitting: Emergency Medicine

## 2018-06-10 VITALS — BP 194/100 | HR 68 | Temp 98.8°F | Resp 16 | Wt 214.9 lb

## 2018-06-10 DIAGNOSIS — L97921 Non-pressure chronic ulcer of unspecified part of left lower leg limited to breakdown of skin: Secondary | ICD-10-CM | POA: Diagnosis not present

## 2018-06-10 DIAGNOSIS — M9901 Segmental and somatic dysfunction of cervical region: Secondary | ICD-10-CM | POA: Diagnosis not present

## 2018-06-10 DIAGNOSIS — M9902 Segmental and somatic dysfunction of thoracic region: Secondary | ICD-10-CM | POA: Diagnosis not present

## 2018-06-10 DIAGNOSIS — M9906 Segmental and somatic dysfunction of lower extremity: Secondary | ICD-10-CM | POA: Diagnosis not present

## 2018-06-10 DIAGNOSIS — L97911 Non-pressure chronic ulcer of unspecified part of right lower leg limited to breakdown of skin: Secondary | ICD-10-CM | POA: Diagnosis not present

## 2018-06-10 DIAGNOSIS — M62830 Muscle spasm of back: Secondary | ICD-10-CM | POA: Diagnosis not present

## 2018-06-10 DIAGNOSIS — M9907 Segmental and somatic dysfunction of upper extremity: Secondary | ICD-10-CM | POA: Diagnosis not present

## 2018-06-10 DIAGNOSIS — M9903 Segmental and somatic dysfunction of lumbar region: Secondary | ICD-10-CM | POA: Diagnosis not present

## 2018-06-10 DIAGNOSIS — I890 Lymphedema, not elsewhere classified: Secondary | ICD-10-CM | POA: Diagnosis not present

## 2018-06-10 NOTE — Patient Instructions (Signed)
How to care for your wound.    1) Gather all of the supplies that you will need  2) Open all sealed supplies.  3) Wash your hands with soap and water.  4) Remove the old dressing and throw it away.  5) Wash your hands again. Put on clean medical gloves if you have them.  6) Clean your wound as recommended by the wound care center.   7) Gently dry the wound with gauze.  8) Apply recommended wound dressing.  9) Cover with gauze or other recommended product.  10) Secure as recommended.    Signs of infection     Temperature greater than 101 degrees   Increased redness or swelling   Increased warmth around the wound   Red streaks around the wound or moving up the extremity   Increased drainage or discharge from the wound   Foul or pungent odor coming from the wound even after cleaning.   Increased pain    The wound care center is open weekdays from 8:00am-4:30pm. If a problem occurs after hours, call your primary doctor or go to the nearest urgent care or emergency room.       Nutrition    Eat at least 0.5 grams of protein per 1 pound of your ideal body weight unless you have a medical condition that restricts your protein intake. This number will be given to you by the wound care staff.    You can get protein from lean meat, fish, beans, milk, yogurt, cheese, nuts, and supplements such as Ensure or whey and casein powders.     Take 1000 milligrams of Vitamin C  twice per day or 2000 mgs consumed thoughout the day. This can be done with supplements, fruit, juices, and/or vegetables.    Drink 6-8 cups of water unless you are on a fluid restriction.    Take a daily multivitamin.    Quit or cut down on nicotine. Nicotine significantly hinders a wounds ability to heal    If you are a diabetic, monitor you blood sugar and stick to a healthy diet.      Myths about wound care    Leaving my wounds open to the air is helpful.  My wounds need air.  Even though you can see them, wounds are the inside of your body. The  exposed tissue is usually covered by skin.  Wounds, like your heart and other organs, get their oxygen and nutrients from the blood stream. Air can cause a wound to dry out, slowing the healing process.    Scabs are good.  A scab is not a true closure to a wound.  Scabs are dead tissue and promote bacterial growth under the scab    Hydrogen peroxide and betadine are good for cleaning wounds.  Hydrogen peroxide and betadine are harmful to certain cells (fibroblasts) that are needed for the wound to close.  Soap & water, or a specialized wound cleanser, along with gauze or a clean cloth are appropriate for cleaning wounds.      Multi-Layer Compression wraps    . The goal of compression wrapping is to support the veins and remove fluid from the legs. This will reduce the pressure in the leg and aid in wound healing.    . This wrap is to be replaced by the wound center staff on a weekly basis.    . If you have pneumatic pumps you should continue to use them while in compression wraps.    .   Showers may be taken only if the wrap is securely covered with a plastic wrap. A cast cover may be useful and can be picked up at a pharmacy.    . If your leg itches under the wrap do not stick anything under the wrap to scratch the itch. This risks the creation of new wounds.    . Let the wound center team know if you have itching on your next visit.    . Elevate your legs, higher than your heart, often during the day. Especially after extended periods of walking or sitting with your legs hanging down.    . There may be drainage on the bandage. These areas may be covered with gauze or paper towels to prevent soiling clothing or linens.    . If drainage is excessive, call the wound center for an earlier appointment.    . If the wrap gets wet, or if you experience pain, numbness, tingling, discoloration, or swelling of the toes that is not relieved with rest, remove the compression wrap promptly and contact the wound care center.      . If the wrap rolls or slides down the leg, snip the roll to relieve pressure and provide comfort.    . It is recommended that you only use safety scissors, bandage scissors, or EMT shears to cut the compression wrap. Normal scissors/shears are not recommended as the sharp edges may cause another wound.    . If you do not have access to these tools it is recommended that you unwrap the outer layer by hand.     . If you have to remove the wrap make sure the wound is covered with a dressing. If you have access to compression garments you may use those until you can return to the wound center.    . Seek immediate medical care if you notice foul odor, increasing pain, increasing drainage, fever, chills, nausea, and/or red streaking up the leg.

## 2018-06-10 NOTE — Progress Notes (Signed)
Pt presents with right lateral calf traumatic ulcer of 3 weeks' duration related to hitting it on her walker. She has sen dermatology for this, as well as for bilateral lower extremity skin changes. She has been treating with neosporin and band aids, and has compression but does not really wear them. She has a home health aide with her today. Reports no pain from the wound, denies F/C/N/V.            The wound is full thickness, measuring 1.2 x 1.6 x 0.2, with base of slough over 5% granular buds and draining moderate serosanguinous. The periwound is pink and intact, with 3+ edema noted bilaterally. The left leg has weeping pinpoints. Areas cleansed with vashe soaked gauze. Debridement performed. Dr. Roseanna Rainbow in to evaluate.    The wound was dressed with Aquacel Ag+ and ABD, moisturizer to both legs, compression with Coban 2 Lite. Pt was educated to elevate legs at home and to cut down wraps if they get wet. [name] Kitchen RTC 7-10 days.

## 2018-06-11 NOTE — Progress Notes (Signed)
[facility] History and Physical Exam      Patient Name: Barbara [name],Barbara [name]  Encounter Date:  06/10/2018  Attending Physician: Theodora Blow, MD  PCP: Piedad Climes, MD  Patient DOB:  1928-07-15  MRN:  [id number]        History of Presenting Illness     Chief complaint: wound care initial visit      Barbara [name] is a 84 y.o. female who presents to the wound care center for a right lower extremity wound sustained approximately 3 weeks ago after striking her leg on a wheelchair.  She has been using Neosporin and Band-Aids to cover the wound.  She has seen no progress toward healing.  She does have a history of lymphedema, and her aide with her today reports that she does not like to wear compression stockings, but will if directed to do so.      Review of Systems     Review of Systems   The patient denies fever, chills, nausea, vomiting or other constitutional symptoms.  10 point review of systems otherwise negative except as above.  Allergies     Pt is allergic to codeine; erythromycin; iodine; penicillins; percocet [oxycodone-acetaminophen]; sulfa antibiotics; tramadol; and zithromax [azithromycin].    Medications       Current Outpatient Medications:   .  B Complex Vitamins (VITAMIN-B COMPLEX) Tab, 1 tablet po QD, Disp: , Rfl:   .  acyclovir (ZOVIRAX) 5 % ointment, Apply topically as needed., Disp: , Rfl:   .  bumetanide (BUMEX) 1 MG tablet, Take 1 mg by mouth 2 (two) times daily, Disp: , Rfl: 1  .  cholecalciferol (VITAMIN D-1000 MAX ST) 1000 units tablet, Take 2,000 Units by mouth, Disp: , Rfl:   .  cloNIDine (CATAPRES) 0.1 MG tablet, 0.1 mg as needed (high blood pressure).  , Disp: , Rfl:   .  furosemide (LASIX) 20 MG tablet, 20 mg daily.  , Disp: , Rfl:   .  gabapentin (NEURONTIN) 300 MG capsule, Take 1 capsule (300 mg total) by mouth 3 (three) times daily. (Patient taking differently: Take 600 mg by mouth 2 (two) times daily.  ), Disp: 60 capsule, Rfl: 0  .  levothyroxine  (SYNTHROID, LEVOTHROID) 88 MCG tablet, 88 mcg daily.  , Disp: , Rfl:   .  losartan (COZAAR) 100 MG tablet, Take 100 mg by mouth daily., Disp: , Rfl:   .  metoprolol (LOPRESSOR) 50 MG tablet, Take 50 mg by mouth 2 (two) times daily., Disp: , Rfl:   .  pantoprazole (PROTONIX) 40 MG tablet, Take 40 mg by mouth daily., Disp: , Rfl:   .  spironolactone (ALDACTONE) 50 MG tablet, TAKE 1 TAB (50 MG TOTAL) BY MOUTH EVERY MORNING WITH BREAKFAST, Disp: , Rfl: 1  .  warfarin (COUMADIN) 1 MG tablet, Take 1 mg by mouth daily.Three days a week take 2. Four days a week take 1  , Disp: , Rfl:   .  warfarin (COUMADIN) 2 MG tablet, Take 2 mg by mouth daily., Disp: , Rfl:      Past Medical History     Pt has a past medical history of A-fib, Arthritis, Congestive heart failure, Disorder of thyroid, Dysphagia, Gastroesophageal reflux disease, Hernia, hiatal, HTN (hypertension), Pulmonary embolism, PVD (peripheral vascular disease), and Seasonal allergic rhinitis.    Past Surgical History     Pt has a past surgical history that includes left arm; Hysterectomy; Colonoscopy; and EGD (N/A, 12/17/2016).  Family History     The family history includes No known problems in her father and mother.    Social History     Pt reports that she has never smoked. She has never used smokeless tobacco. She reports that she does not drink alcohol or use drugs.    Physical Exam     Blood pressure (!) 194/100, pulse 68, temperature 98.8 F (37.1 C), temperature source Oral, resp. rate 16, weight 97.5 kg (214 lb 15.2 oz), SpO2 91 %.     Constitutional:  Vitals signs reviewed, well-appearing  Eyes:  Normal to inspection, no discharge  ENT:  Mouth normal to inspection, MMM  Head:  Atraumatic, normocephalic  Neck: supple  Resp/Chest:  No distress  Neuro:  GCS 15, no focal motor deficits  Skin:  Warm, dry, no rash  Upper Extremities:  Inspection normal, no cyanosis  Lower Extremities: 3+ edema noted of the bilateral lower extremities.  She has advanced  hemosiderin staining and lipodermatosclerosis as well.  The left lower extremity has multiple pinpoint openings which are weeping serous fluid.  The right lower extremity has a 1.2 x 1.6 x 0.2 cm full-thickness wound with a fibro-granular base and pink wound edges.  No surrounding erythema or induration.  No lymphangitis.  No wound odor or grossly purulent drainage.  Feet are warm.  Capillary refill is less than 3 seconds.  DP and PT pulses not palpated, likely due to edema.      Procedures     None    Diagnosis / Disposition     Clinical Impression  1. Skin ulcer of left lower leg, limited to breakdown of skin    2. Skin ulcer of right lower leg, limited to breakdown of skin        Plan: We will use Aquacel silver on the wounds covered by ABD pads.  We will compress with Coban lite.  Follow-up in the wound care center in 7 to 10 days.    Prescriptions  New Prescriptions    No medications on file           This note has been electronically signed by Theodora Blow, M.D.        *This note was generated by the Epic EMR system/ Dragon speech recognition and may contain inherent errors or omissions not intended by the user. Grammatical errors, random word insertions, deletions, pronoun errors and incomplete sentences are occasional consequences of this technology due to software limitations. Not all errors are caught or corrected. If there are questions or concerns about the content of this note or information contained within the body of this dictation they should be addressed directly with the author for clarification

## 2018-06-16 ENCOUNTER — Emergency Department
Admission: EM | Admit: 2018-06-16 | Discharge: 2018-06-16 | Disposition: A | Discharge status: Home / Self Care | Payer: Medicare (Managed Care) | Attending: Emergency Medicine | Admitting: Emergency Medicine

## 2018-06-16 ENCOUNTER — Emergency Department: Payer: Medicare (Managed Care)

## 2018-06-16 DIAGNOSIS — N183 Chronic kidney disease, stage 3 unspecified: Secondary | ICD-10-CM

## 2018-06-16 DIAGNOSIS — M1712 Unilateral primary osteoarthritis, left knee: Secondary | ICD-10-CM | POA: Diagnosis not present

## 2018-06-16 DIAGNOSIS — M79605 Pain in left leg: Secondary | ICD-10-CM | POA: Diagnosis not present

## 2018-06-16 DIAGNOSIS — J810 Acute pulmonary edema: Secondary | ICD-10-CM | POA: Diagnosis not present

## 2018-06-16 DIAGNOSIS — M6281 Muscle weakness (generalized): Secondary | ICD-10-CM | POA: Diagnosis not present

## 2018-06-16 DIAGNOSIS — N3001 Acute cystitis with hematuria: Secondary | ICD-10-CM | POA: Diagnosis not present

## 2018-06-16 DIAGNOSIS — I129 Hypertensive chronic kidney disease with stage 1 through stage 4 chronic kidney disease, or unspecified chronic kidney disease: Secondary | ICD-10-CM | POA: Diagnosis not present

## 2018-06-16 DIAGNOSIS — M1711 Unilateral primary osteoarthritis, right knee: Secondary | ICD-10-CM | POA: Diagnosis not present

## 2018-06-16 DIAGNOSIS — R0602 Shortness of breath: Secondary | ICD-10-CM | POA: Diagnosis not present

## 2018-06-16 DIAGNOSIS — M79606 Pain in leg, unspecified: Secondary | ICD-10-CM | POA: Diagnosis not present

## 2018-06-16 HISTORY — DX: Unspecified dementia, unspecified severity, without behavioral disturbance, psychotic disturbance, mood disturbance, and anxiety: F03.90

## 2018-06-16 LAB — CBC AND DIFFERENTIAL
Basophils %: 1.2 % (ref 0.0–3.0)
Basophils Absolute: 0.1 10*3/uL (ref 0.0–0.3)
Eosinophils %: 1.7 % (ref 0.0–7.0)
Eosinophils Absolute: 0.1 10*3/uL (ref 0.0–0.8)
Hematocrit: 43.5 % (ref 36.0–48.0)
Hemoglobin: 14.2 gm/dL (ref 12.0–16.0)
Lymphocytes Absolute: 0.9 10*3/uL (ref 0.6–5.1)
Lymphocytes: 13.3 % — ABNORMAL LOW (ref 15.0–46.0)
MCH: 32 pg (ref 28–35)
MCHC: 33 gm/dL (ref 32–36)
MCV: 99 fL (ref 80–100)
MPV: 9.1 fL (ref 6.0–10.0)
Monocytes Absolute: 0.5 10*3/uL (ref 0.1–1.7)
Monocytes: 7.3 % (ref 3.0–15.0)
Neutrophils %: 76.5 % (ref 42.0–78.0)
Neutrophils Absolute: 5.3 10*3/uL (ref 1.7–8.6)
PLT CT: 175 10*3/uL (ref 130–440)
RBC: 4.39 10*6/uL (ref 3.80–5.00)
RDW: 13.3 % (ref 11.0–14.0)
WBC: 6.9 10*3/uL (ref 4.0–11.0)

## 2018-06-16 LAB — COMPREHENSIVE METABOLIC PANEL
ALT: 13 U/L (ref 0–55)
AST (SGOT): 31 U/L (ref 10–42)
Albumin/Globulin Ratio: 0.84 Ratio (ref 0.80–2.00)
Albumin: 3.2 gm/dL — ABNORMAL LOW (ref 3.5–5.0)
Alkaline Phosphatase: 143 U/L (ref 40–145)
Anion Gap: 16.8 mMol/L (ref 7.0–18.0)
BUN / Creatinine Ratio: 7.8 Ratio — ABNORMAL LOW (ref 10.0–30.0)
BUN: 8 mg/dL (ref 7–22)
Bilirubin, Total: 1.9 mg/dL — ABNORMAL HIGH (ref 0.1–1.2)
CO2: 19 mMol/L — ABNORMAL LOW (ref 20–30)
Calcium: 9.8 mg/dL (ref 8.5–10.5)
Chloride: 95 mMol/L — ABNORMAL LOW (ref 98–110)
Creatinine: 1.02 mg/dL (ref 0.60–1.20)
EGFR: 49 mL/min/{1.73_m2} — ABNORMAL LOW (ref 60–150)
Globulin: 3.8 gm/dL (ref 2.0–4.0)
Glucose: 98 mg/dL (ref 71–99)
Osmolality Calc: 252 mOsm/kg — ABNORMAL LOW (ref 275–300)
Potassium: 4.8 mMol/L (ref 3.5–5.3)
Protein, Total: 7 gm/dL (ref 6.0–8.3)
Sodium: 126 mMol/L — ABNORMAL LOW (ref 136–147)

## 2018-06-16 LAB — ECG 12-LEAD
Patient Age: 89 years
Q-T Interval(Corrected): 442 ms
Q-T Interval: 428 ms
QRS Axis: -48 deg
QRS Duration: 119 ms
T Axis: 13 years
Ventricular Rate: 64 //min

## 2018-06-16 LAB — TROPONIN I: Troponin I: 0.01 ng/mL (ref 0.00–0.02)

## 2018-06-16 LAB — VH URINALYSIS WITH MICROSCOPIC
Bilirubin, UA: NEGATIVE
Blood, UA: NEGATIVE
Glucose, UA: NEGATIVE mg/dL
Ketones UA: NEGATIVE mg/dL
Leukocyte Esterase, UA: 250 Leu/uL — AB
Nitrite, UA: NEGATIVE
Protein, UR: NEGATIVE mg/dL
RBC, UA: 5 /hpf (ref 0–5)
Squam Epithel, UA: 4 /hpf — ABNORMAL HIGH (ref 0–2)
Urine Specific Gravity: 1.011 (ref 1.001–1.040)
Urobilinogen, UA: NORMAL mg/dL
WBC, UA: 28 /hpf — ABNORMAL HIGH (ref 0–4)
pH, Urine: 8 pH (ref 5.0–8.0)

## 2018-06-16 LAB — B-TYPE NATRIURETIC PEPTIDE: B-Natriuretic Peptide: 336.1 pg/mL — ABNORMAL HIGH (ref 0.0–100.0)

## 2018-06-16 LAB — PT/INR
PT INR: 2.9 — ABNORMAL HIGH (ref 0.5–1.3)
PT: 27.5 s — ABNORMAL HIGH (ref 9.5–11.5)

## 2018-06-16 MED ORDER — CEFTRIAXONE SODIUM 1 G IJ SOLR
INTRAMUSCULAR | Status: AC
Start: 2018-06-16 — End: ?
  Filled 2018-06-16: qty 1000

## 2018-06-16 MED ORDER — CEFTRIAXONE SODIUM 1 G IJ SOLR
1.00 g | Freq: Once | INTRAMUSCULAR | Status: AC
Start: 2018-06-16 — End: 2018-06-16
  Administered 2018-06-16: 16:00:00 1 g via INTRAVENOUS

## 2018-06-16 MED ORDER — VH SPIRONOLACTONE 25 MG PO TABS
50.00 mg | ORAL_TABLET | Freq: Every day | ORAL | Status: DC
Start: 2018-06-16 — End: 2018-06-16
  Administered 2018-06-16: 17:00:00 50 mg via ORAL
  Filled 2018-06-16 (×2): qty 2

## 2018-06-16 MED ORDER — VH SPIRONOLACTONE 25 MG PO TABS
25.00 mg | ORAL_TABLET | Freq: Every day | ORAL | Status: DC
Start: 2018-06-16 — End: 2018-06-16

## 2018-06-16 MED ORDER — STERILE WATER FOR INJECTION IJ SOLN
INTRAMUSCULAR | Status: AC
Start: 2018-06-16 — End: ?
  Filled 2018-06-16: qty 10

## 2018-06-16 MED ORDER — CEPHALEXIN 500 MG PO CAPS
500.00 mg | ORAL_CAPSULE | Freq: Four times a day (QID) | ORAL | 0 refills | Status: AC
Start: 2018-06-16 — End: 2018-06-23

## 2018-06-16 MED ORDER — BUMETANIDE 1 MG PO TABS
1.00 mg | ORAL_TABLET | Freq: Every day | ORAL | Status: DC
Start: 2018-06-16 — End: 2018-06-16
  Administered 2018-06-16: 17:00:00 1 mg via ORAL
  Filled 2018-06-16: qty 1

## 2018-06-16 NOTE — ED Notes (Signed)
Pt denies any pain at this time states " I ready to go home, I want all of this off." pt made aware she is not discharged yet.

## 2018-06-16 NOTE — ED Notes (Signed)
Pt would like to speak with Dr. Nechama Guard, pt would like to go home,  Dr. Nechama Guard reports pt will not be going home.

## 2018-06-16 NOTE — ED Notes (Signed)
Pt asking the paramedic student " arent you the doctor"

## 2018-06-16 NOTE — ED Notes (Signed)
Pt and pt daughter in law who is at bedside, constantly arguing over everything from medications to symptoms, to what the pt came in for today.

## 2018-06-16 NOTE — ED Triage Notes (Addendum)
Per pt she called her daughter in law who cares for her at [facility] she was having trouble walking due to left knee pain. Per daughter in law in law,  Who reports the pt called her complaining of chest pain and left arm pain and SOB as well as nausea. Pt denies this when the daughter in law mentions it. Pt only reports c/o leg pain, per daughter in law,pt also has a wound to right leg. Pt has wraps on bilateral legs and ultrasound at bedside, will remove and assess once ultrasound is done. Pt has hx of dementia. Pt and daughter in law very argumentative with each other.

## 2018-06-16 NOTE — ED Notes (Signed)
Pt given a phone to call her son per request. Spoke with her son who is on the way. Pt concerned because her daughter in law is now a pt.

## 2018-06-16 NOTE — ED Notes (Signed)
Pt able to ambulate with minimal assist with walker, to hallway and back to bed. MD aware, reports it is okay for pt to be discharged.

## 2018-06-16 NOTE — ED Notes (Signed)
Pt son here aware MD would like pt to stay and pt does not want to stay. He is at bedside await a walker to ambulate pt.

## 2018-06-16 NOTE — ED Provider Notes (Signed)
ED DOC NOTE     History provided by the patient and daughter in law    History is limited by pateint is a poor historian and there is disagreement between patient and daughter in law over whey she is here    HPI     84 y.o. female  Multiple complaitns  Right arm pain in the fourth and fifth fingers radiating up  Chronic sob  Leg pain left greater than right (worried about dvt)  Leg swelling chronically  Difficulty walking, family concerned she would fall but patient states she "walks just fine with a walker"    These problems are all chronic, patient denies any new complaints today.  Associated symptoms no chest pain.    Patient stated to me and the staff, that she just wanted to have her daughter in law with her today    Review of Systems   Review of Systems  Rest of systems negative      PAST MEDICAL/SURGICAL HISTORY:  Past Medical History:   Diagnosis Date   . A-fib    . Arthritis    . Congestive heart failure    . Dementia    . Disorder of thyroid    . Dysphagia    . Gastroesophageal reflux disease    . Hernia, hiatal    . HTN (hypertension)    . Pulmonary embolism    . PVD (peripheral vascular disease)    . Seasonal allergic rhinitis       Past Surgical History:   Procedure Laterality Date   . COLONOSCOPY     . EGD N/A 12/17/2016    Procedure: EGD w/ dilitation;  Surgeon: Jayme Cloud, MD;  Location: [facility];  Service: Gastroenterology;  Laterality: N/A;   . HYSTERECTOMY     . left arm       Additional Past Medical/Surgical History:       SOCIAL AND FAMILY HISTORY:  Social History     Tobacco Use   . Smoking status: Never Smoker   . Smokeless tobacco: Never Used   Substance Use Topics   . Alcohol use: No   . Drug use: No     Family History   Problem Relation Age of Onset   . No known problems Mother    . No known problems Father         Additional Social and Family History:  Additional SH: [x]   I reviewed SH above       Additional FH: [x]  I reviewed FH above    Allergies   Allergen Reactions   . Codeine  Other (See Comments)   . Erythromycin    . Iodine Other (See Comments)   . Penicillins Other (See Comments)   . Percocet [Oxycodone-Acetaminophen] Other (See Comments)   . Sulfa Antibiotics Other (See Comments)   . Tramadol Other (See Comments)   . Zithromax [Azithromycin] Other (See Comments)     Additional Allergies:    Prior to Admission medications    Medication Sig Start Date End Date Taking? Authorizing Provider   acyclovir (ZOVIRAX) 5 % ointment Apply topically as needed.    [provider]   B Complex Vitamins (VITAMIN-B COMPLEX) Tab 1 tablet po QD 07/08/17   [provider]   bumetanide (BUMEX) 1 MG tablet Take 1 mg by mouth 2 (two) times daily 05/05/18   [provider]   cholecalciferol (VITAMIN D-1000 MAX ST) 1000 units tablet Take 2,000 Units by mouth  [provider]   cloNIDine (CATAPRES) 0.1 MG tablet 0.1 mg as needed (high blood pressure).     05/07/15   [provider]   furosemide (LASIX) 20 MG tablet 20 mg daily.     03/06/15   [provider]   gabapentin (NEURONTIN) 300 MG capsule Take 1 capsule (300 mg total) by mouth 3 (three) times daily.  Patient taking differently: Take 600 mg by mouth 2 (two) times daily.     05/05/14   Piedad Climes, MD   levothyroxine (SYNTHROID, LEVOTHROID) 88 MCG tablet 88 mcg daily.     03/05/14   [provider]   losartan (COZAAR) 100 MG tablet Take 100 mg by mouth daily.    [provider]   metoprolol (LOPRESSOR) 50 MG tablet Take 50 mg by mouth 2 (two) times daily.    [provider]   pantoprazole (PROTONIX) 40 MG tablet Take 40 mg by mouth daily.    [provider]   spironolactone (ALDACTONE) 50 MG tablet TAKE 1 TAB (50 MG TOTAL) BY MOUTH EVERY MORNING WITH BREAKFAST 05/05/18   [provider]   warfarin (COUMADIN) 1 MG tablet Take 1 mg by mouth daily.Three days a week take 2. Four days a week take 1        [provider]   warfarin (COUMADIN) 2 MG  tablet Take 2 mg by mouth daily.    [provider]       PHYSICAL EXAM   Vitals:    06/16/18 1800   BP: (S) (!) 201/66   Pulse: 73   Resp: 18   Temp: 98.6 F (37 C)   SpO2: 100%     [x]  Nursing note reviewed [x]  Vitals reviewed   Pulse Oximetry on Room Air Normal  Physical Exam  Constitutional:       Appearance: Normal appearance.   HENT:      Head: Normocephalic.      Right Ear: External ear normal.      Left Ear: External ear normal.      Nose: Nose normal.      Mouth/Throat:      Mouth: Mucous membranes are moist.   Eyes:      Pupils: Pupils are equal, round, and reactive to light.   Neck:      Musculoskeletal: Normal range of motion.   Cardiovascular:      Rate and Rhythm: Normal rate. Rhythm irregularly irregular.   Pulmonary:      Effort: Pulmonary effort is normal. No respiratory distress.      Breath sounds: Normal breath sounds.   Musculoskeletal:      Right lower leg: Edema present.      Left lower leg: Edema present.      Comments: Chronic edema   Skin:     Comments:  well healing ulcer right lateral calf   Neurological:      General: No focal deficit present.      Mental Status: She is alert.   Psychiatric:         Mood and Affect: Mood normal.         LABS/TESTS:  Results     Procedure Component Value Units Date/Time    Urine Culture [[id number]] Collected:  06/16/18 1449    Specimen:  Urine from Voided Updated:  06/16/18 1715    Narrative:       Specimen/Source: Urine Specimens/Voided  Collected: 06/16/2018 14:49     Status: Valued  Last Updated: 06/16/2018 17:15                Culture Result (Prelim)      Culture In Progress          Prothrombin time/INR [[id number]]  (Abnormal) Collected:  06/16/18 1457    Specimen:  Blood Updated:  06/16/18 1517     PT 27.5 sec      PT INR 2.9    Narrative:       This order is a replacement of the rejected order with accession number [id number].    CMP [[id number]]  (Abnormal) Collected:  06/16/18 1400    Specimen:  Plasma Updated:  06/16/18 1515      Sodium 126 mMol/L      Potassium 4.8 mMol/L      Chloride 95 mMol/L      CO2 19 mMol/L      Calcium 9.8 mg/dL      Glucose 98 mg/dL      Creatinine 6.57 mg/dL      BUN 8 mg/dL      Protein, Total 7.0 gm/dL      Albumin 3.2 gm/dL      Alkaline Phosphatase 143 U/L      ALT 13 U/L      AST (SGOT) 31 U/L      Bilirubin, Total 1.9 mg/dL      Albumin/Globulin Ratio 0.84 Ratio      Anion Gap 16.8 mMol/L      BUN/Creatinine Ratio 7.8 Ratio      EGFR 49 mL/min/1.49m2      Osmolality Calc 252 mOsm/kg      Globulin 3.8 gm/dL     Urinalysis with Microscopic [[id number]]  (Abnormal) Collected:  06/16/18 1449    Specimen:  Urine, Random Updated:  06/16/18 1503     Color, UA Yellow     Clarity, UA Slightly Cloudy     Specific Gravity, UR 1.011     pH, Urine 8.0 pH      Protein, UR Negative mg/dL      Glucose, UA Negative mg/dL      Ketones UA Negative mg/dL      Bilirubin, UA Negative     Blood, UA Negative     Nitrite, UA Negative     Urobilinogen, UA Normal mg/dL      Leukocyte Esterase, UA 250 Leu/uL      UR Micro Performed     WBC, UA 28 /hpf      RBC, UA 5 /hpf      Bacteria, UA Rare /hpf      Squam Epithel, UA 4 /hpf      Amorphous, UA Rare /uL     BNP [[id number]]  (Abnormal) Collected:  06/16/18 1400    Specimen:  Blood Updated:  06/16/18 1435     B-Natriuretic Peptide 336.1 pg/mL     Troponin I (Stat) [[id number]] Collected:  06/16/18 1400    Specimen:  Plasma Updated:  06/16/18 1435     Troponin I 0.01 ng/mL     CBC [[id number]]  (Abnormal) Collected:  06/16/18 1400    Specimen:  Blood Updated:  06/16/18 1431     WBC 6.9 K/cmm      RBC 4.39 M/cmm      Hemoglobin 14.2 gm/dL      Hematocrit 56.3 %      MCV 99 fL      MCH 32 pg      MCHC 33 gm/dL  RDW 13.3 %      PLT CT 175 K/cmm      MPV 9.1 fL      NEUTROPHIL % 76.5 %      Lymphocytes 13.3 %      Monocytes 7.3 %      Eosinophils % 1.7 %      Basophils % 1.2 %      Neutrophils Absolute 5.3 K/cmm      Lymphocytes Absolute 0.9 K/cmm      Monocytes Absolute 0.5 K/cmm       Eosinophils Absolute 0.1 K/cmm      BASO Absolute 0.1 K/cmm             Xr Chest 2 Views    Result Date: 06/16/2018  Cardiomegaly with interstitial edema. ReadingStation:[email or web address]    Xr Knee Left 4+ Views    Result Date: 06/16/2018  There is moderate to severe degenerative change of the left knee. No fracture or dislocation is identified. No joint effusion is seen. There is mild diffuse bony osteoporosis. ReadingStation:[email or web address]    Xr Knee Right 4+ Views    Result Date: 06/16/2018  IMPRESSION: Osteoarthritic changes of the knee. No acute process. ReadingStation:[email or web address]    US Venous Low Extrem Duplex [employer] Bilat    Result Date: 06/16/2018  No evidence of deep vein thrombosis either leg. ReadingStation:[email or web address]            EKG:   I have interpreted the EKG at the time it was performed and my official reading is as follows:  Last EKG Result     Procedure Component Value Units Date/Time    ECG 12 lead (Specify reason for exam) [[id number]] Collected:  06/16/18 1230     Updated:  06/16/18 1235     Patient Age 65 years      Patient DOB 1927/12/22     Patient Height --     Patient Weight --     Interpretation Text --     Atrial fibrillation  Incomplete RBBB and LAFB  Probable anterior infarct, old  Compared to ECG 06/25/2016 02:16:00  Left anterior fascicular block now present  Incomplete right bundle-branch block now present  Right bundle-branch block now present  Myocardial infarct finding still present       Physician Interpreter --     Ventricular Rate 64 //min      QRS Duration 119 ms      P-R Interval -- ms      Q-T Interval 428 ms      Q-T Interval(Corrected) 442 ms      P Wave Axis -- deg      QRS Axis -48 deg      T Axis 13 years             ED COURSE:  BP (S) (!) 201/66 Comment: MD AWARE   Pulse 73   Temp 98.6 F (37 C) (Oral)   Resp 18   Wt 98.8 kg   SpO2 100%   BMI 38.58 kg/m   ED Course as of Jun 16 2226   Wed Jun 16, 2018   1558 This appears to be chronic   Sodium(!): 126 [SB]   1559 Bacteria, UA(!):  Rare [SB]   1559 WBC, UA(!): 28 [SB]   1559 Leukocyte Esterase, UA(!): 250 [SB]   1603 Patient has not been taking her Bumex nor her Aldactone    [SB]   1607 Paged [facility] physicians for admission    [SB]  1625 I have given patient information on her test results.  She was adamant that she wanted to be discharged home.  Her daughter-in-law who is here with her in the emergency department is currently being evaluated for a possible syncopal episode.  Patient states she lives alone uses a walker.  Her son is reportedly coming up to pick her up.  I had a long conversation that I was concerned that she may not be safe at home given that this was her daughter-in-law's concern.  She was again adamant that she did not want to be admitted to the hospital.  I have requested the nurses walk the patient in the emergency department to determine safety.    [SB]      ED Course User Index  [SB] Harriette Bouillon, MD         PROCEDURES:        IMPRESSION:     84 y.o. female with  1. Acute cystitis with hematuria    2. Generalized muscle weakness    3. Chronic kidney disease (CKD), stage III (moderate)        DIFFERENTIAL DIAGNOSIS:  UTI, Vaginitis, Herpes labialis, cystitis, bladder spasm, urinary retention      MEDICAL DECISION MAKING:  I have evaluated all labs and imaging studies in the scope of a single Emergency Department visit and have determined that at this time an acute life threatening illness  does not exist. I have explained all results to the patient and all questions have been answered.  The patient have been given strict return instructions to return to the ER for any worsening or changing symptoms and that they should follow up with a primary care provider. The patient was discharge home in stable condition.      PLAN:   ED Disposition     ED Disposition Condition Date/Time Comment    Discharge  Wed Jun 16, 2018  5:01 PM [facility] discharge to home/self care.    Condition at disposition: Stable        Discharge  Medication List as of 06/16/2018  5:01 PM      START taking these medications    Details   cephalexin (KEFLEX) 500 MG capsule Take 1 capsule (500 mg total) by mouth 4 (four) times daily for 7 days, Starting Wed 06/16/2018, Until Wed 06/23/2018, Normal                Harriette Bouillon, MD  06/16/18 2227

## 2018-06-16 NOTE — ED Notes (Signed)
Dr. Nechama Guard reports he wants pt to stay, pt wants to go home, asked this RN to walk pt.

## 2018-06-16 NOTE — ED Notes (Signed)
Pt in xray, pt daughter in law at bedside.

## 2018-06-16 NOTE — ED Notes (Addendum)
Unable to assess pt or place IV, ultrasound at bedside,with the lights off so  will do so once they are finished.

## 2018-06-16 NOTE — ED Notes (Signed)
Dr. Nechama Guard aware increased BP, pt did not take her bumex or aldactone this am. See new orders.

## 2018-06-16 NOTE — ED Notes (Signed)
Pt urinated on bed pan, pt noted to have wet pad on her panties. Pt insistent about leaving it on despite it being wet, this RN gave pt a pull up and then placed her panties over the pull up instead. Pt educated on the need to not be wearing wet pads/clothes due to UTI's

## 2018-06-16 NOTE — ED Notes (Signed)
Wraps removed from legs, dr. Nechama Guard at bedside.

## 2018-06-16 NOTE — ED Notes (Signed)
Pt and son at bedside, dr. Nechama Guard speaking with them, per pt son, his wife who is currently a pt in [facility], would like the doctor to come to [facility] and speak with her, dr. Nechama Guard aware, dr. Nechama Guard at bedside speaking with pt and pt son.

## 2018-06-16 NOTE — Discharge Instructions (Signed)
Understanding Urinary Tract Infections (UTIs)  Most UTIs are caused by bacteria, although they may also be caused by viruses or fungi. Bacteria from the bowel are the most common source of infection.The infection may start because of any of the following:   Sexual activity. During sex, bacteria can travel from the penis, vagina, or rectum into the urethra.   Bacteria on the skinoutside the rectum may travel into the urethra. This is more common in women since the rectum and urethra are closer to each other than in men. Wiping from front to back after using the toilet and keeping the area clean can help prevent germs from getting to the urethra.   Blockage of urine flow through the urinary tract. If urine sits too long, germs may start to grow out of control.      Parts of the urinary tract  The infection canoccur in any part of the urinary tract.   The kidneys collect and store urine.   The ureters carry urine from the kidneys to the bladder.   The bladder holds urine until you are ready to let it out.   The urethra carries urine from the bladder out of the body. It is shorter in women, so bacteria can move throughit more easily.The urethra is longer in men, so a UTI is less likely to reach the bladder or kidneys in men.  Date Last Reviewed: 09/09/2015   2000-2019 [employer] [address], [place] [address]. All rights reserved. This information is not intended as a substitute for professional medical care. Always follow your healthcare professional's instructions.        Bladder Infection,Female (Adult)    Urine is normally doesn't have any bacteria in it. But bacteria can get into the urinary tract from the skin around the rectum. Or they can travel in the blood from elsewhere in the body. Once they are in your urinary tract, they can cause infection in the urethra (urethritis), the bladder (cystitis), or the kidneys (pyelonephritis).  The most common place for an infection  is in the bladder. This is called a bladder infection. This is one of the most common infections in women. Most bladder infections are easily treated. They are not serious unless the infection spreads to the kidney.  The phrases "bladder infection," "UTI," and "cystitis" are often used to describe the same thing. But they are not always the same. Cystitis is an inflammation of the bladder. Themost common cause of cystitis is an infection.  Symptoms  The infection causes inflammation in the urethra and bladder. This causes many of the symptoms. The most common symptoms of a bladder infection are:   Pain or burning when urinating   Having to urinate more often than usual   Urgent need to urinate   Only a small amount of urine comes out   Blood in urine   Abdominal discomfort. This is usually in the lower abdomen above the pubic bone.   Cloudy urine   Strong- or bad-smelling urine   Unable to urinate (urinary retention)   Unable to hold urine in (urinary incontinence)   Fever   Loss of appetite   Confusion (in older adults)  Causes  Bladder infections are not contagious. You can't get one from someone else, from a toilet seat, or from sharing a bath.  The most common cause of bladder infections is bacteria from the bowels. The bacteria get onto the skin around the opening of the urethra. From   there, they can get into the urine and travel up to the bladder, causing inflammation and infection. This usually happens because of:   Wiping improperly after urinating. Always wipe from front to back.   Bowel incontinence   Pregnancy   Procedures such as having a catheter inserted   Older age   Not emptying your bladder. This can allow bacteria a chance to grow in your urine.   Dehydration   Constipation   Sex   Use of a diaphragm for birth control  Treatment  Bladder infections are diagnosed by a urine test. They are treated with antibiotics and usuallyclear up quickly without complications. Treatment  helps prevent a more serious kidney infection.  Medicines  Medicines can help in the treatment of a bladder infection:   Take antibiotics until they are used up, even if you feel better. It is important to finish them to make sure the infection has cleared.   You can use acetaminophen or ibuprofen for pain, fever, or discomfort, unless another medicine was prescribed. If you have chronic liver or kidney disease, talk with your healthcareprovider before usingthese medicines. Also talk with your provider if you've ever had a stomach ulcer or gastrointestinal bleeding, or are taking blood-thinner medicines.   If you are givenphenazopydridine to reduce burning with urination, it will cause your urine to become a bright orange color. This can stain clothing.  Care and prevention  These self-care steps can help prevent future infections:   Drink plenty of fluids to prevent dehydration and flush out your bladder. Do thisunless you must restrict fluids for other health reasons, or your doctor told you not to.   Proper cleaning after going to the bathroom is important. Wipe from front to back after using the toilet to prevent the spread of bacteria.   Urinate more often. Don't try to hold urine in for a long time.   Wear loose-fitting clothes and cotton underwear. Avoid tight-fitting pants.   Improve your diet and prevent constipation. Eat more fresh fruit and vegetables, andfiber, and less junk and fatty foods.   Avoid sex until your symptoms are gone.   Avoid caffeine, alcohol, and spicy foods. These can irritate your bladder.   Urinate right after intercourse to flush out your bladder.   If you use birth control pills and have frequent bladder infections, discuss it with your doctor.  Follow-up care  Call your healthcare provider if all symptoms are not gone after 3 days of treatment. This is especially important if you have repeat infections.  If a culture was done, you will be told if your treatment  needs to be changed. If directed, you can callto find out the results.  If X-rays were done, you will be told if the results will affect yourtreatment.  Call 911  Call 911 if any of the following occur:   Trouble breathing   Hard to wake up orconfusion   Fainting or loss of consciousness   Rapid heart rate  When to seek medical advice  Call your healthcare provider right away if any of these occur:   Fever of 100.4F (38.0C) or higher, or as directed by your healthcare provider   Symptoms are not betterby the third day of treatment   Back or belly (abdominal) pain that gets worse   Repeated vomiting, or unable to keep medicine down   Weakness or dizziness   Vaginal discharge   Pain, redness, or swelling in the outer vaginal area (labia)  Date Last   Date Last Reviewed: 06/09/2015   2000-2019 [employer] [place]. [address] [place], [place] [address]. All rights reserved. This information is not intended as a substitute for professional medical care. Always follow your healthcare professional's instructions.          Weakness with Uncertain Cause  Based on your exam today, the exact cause of your weakness is not certain. But your weakness does not seem to be a sign of a serious illness at this time. Keep an eye on your symptoms and get medical advice as instructed below.  Home care   Rest at home today. Don't over-exert yourself.   Take any medicine as prescribed.   For thenext few days, drink extra fluids (unless your healthcare provider wants you to restrict fluids for other reasons). Don't skip meals.   Unless otherwise directed, continue to take any prescription medicines.   Contact your healthcare provider if you have any questions or concerns.  Follow-up care  Follow up with your healthcare provider, or as advised.  When to seek medical advice  Call your healthcare provider right awayfor any of the following:   Symptoms get worse   Symptoms don't start getting better within 2  days   Fever of 100.4 F (38 C) or higher, or as directed by your healthcare provider  Call 911  Call 911 for any of these:   Chest, arm, neck, jaw, or upper back pain   Trouble breathing   Numbness or weakness of the face, one arm, or one leg   Slurred speech, confusion, ortrouble speaking, walking, or seeing   Blood in vomit or stool (black or red color)   Loss of consciousness   Severe headache  Date Last Reviewed: 06/08/2016   2000-2019 [employer], [place]. [address] [place], [place] [address]. All rights reserved. This information is not intended as a substitute for professional medical care. Always follow your healthcare professional's instructions.

## 2018-06-16 NOTE — ED Notes (Signed)
Pt son has left bedside, unable to convince pt to stay.

## 2018-06-16 NOTE — ED Notes (Signed)
Pt in bathroom, pt had wet pull/panties, pt wanted to put her wet panties back on over new pull up, this RN would not allow pt to do so and gave pt mesh panties to put on over pull up, pt again educated on need to not wear wet pads/pull ups/clothes due to UTIs.

## 2018-06-16 NOTE — ED Notes (Signed)
Asked the secretary to order pt a walker.

## 2018-06-16 NOTE — ED Notes (Signed)
Called pharmacy about bumex and aldactone at this time.

## 2018-06-16 NOTE — ED Notes (Signed)
Pt in the bathroom with student, will attempt IV once pt finishes

## 2018-06-16 NOTE — ED Notes (Addendum)
IV unsuccessful to [facility], RN aware.

## 2018-06-22 ENCOUNTER — Ambulatory Visit
Admission: RE | Admit: 2018-06-22 | Discharge: 2018-06-22 | Disposition: A | Discharge status: Home / Self Care | Payer: Medicare (Managed Care) | Source: Ambulatory Visit | Attending: Family Medicine | Admitting: Family Medicine

## 2018-06-22 DIAGNOSIS — L97911 Non-pressure chronic ulcer of unspecified part of right lower leg limited to breakdown of skin: Secondary | ICD-10-CM | POA: Diagnosis not present

## 2018-06-22 DIAGNOSIS — L97921 Non-pressure chronic ulcer of unspecified part of left lower leg limited to breakdown of skin: Secondary | ICD-10-CM | POA: Diagnosis not present

## 2018-06-22 NOTE — Patient Instructions (Signed)
Multi-Layer Compression wraps    . The goal of compression wrapping is to support the veins and remove fluid from the legs. This will reduce the pressure in the leg and aid in wound healing.    . This wrap is to be replaced by the wound center staff on a weekly basis.    . If you have pneumatic pumps you should continue to use them while in compression wraps.    . Showers may be taken only if the wrap is securely covered with a plastic wrap. A cast cover may be useful and can be picked up at a pharmacy.    . If your leg itches under the wrap do not stick anything under the wrap to scratch the itch. This risks the creation of new wounds.    . Let the wound center team know if you have itching on your next visit.    . Elevate your legs, higher than your heart, often during the day. Especially after extended periods of walking or sitting with your legs hanging down.    . There may be drainage on the bandage. These areas may be covered with gauze or paper towels to prevent soiling clothing or linens.    . If drainage is excessive, call the wound center for an earlier appointment.    . If the wrap gets wet, or if you experience pain, numbness, tingling, discoloration, or swelling of the toes that is not relieved with rest, remove the compression wrap promptly and contact the wound care center.     . If the wrap rolls or slides down the leg, snip the roll to relieve pressure and provide comfort.    . It is recommended that you only use safety scissors, bandage scissors, or EMT shears to cut the compression wrap. Normal scissors/shears are not recommended as the sharp edges may cause another wound.    . If you do not have access to these tools it is recommended that you unwrap the outer layer by hand.     . If you have to remove the wrap make sure the wound is covered with a dressing. If you have access to compression garments you may use those until you can return to the wound center.    . Seek immediate medical care if  you notice foul odor, increasing pain, increasing drainage, fever, chills, nausea, and/or red streaking up the leg.

## 2018-06-22 NOTE — Progress Notes (Signed)
Pt presents with right lateral calf traumatic ulcer of 4 weeks' duration related to hitting it on her walker. She has sen dermatology for this, as well as for bilateral lower extremity skin changes. She was treated with compression wraps,however she had the wraps removed 5 days ago while in the ED for "knee pain"-since then she has left the wound OTA and no compression. Today the wound is dry and scabbed -there is 3+edema to both legs.      Reports no pain from the wound, denies F/C/N/V.    Lidocaine applied- I debrided with a 3mm curette to remove the scab     The wound is full thickness, measuring 1.1 X 2.0 X 0.2 , with a red base- minimal serous drainage    The left leg has weeping pinpoints.     Areas cleansed with vashe soaked gauze.    The wound was dressed with Aquacel Ag+ , moisturizer to both legs, compression with Coban 2 Lite. Pt was educated to elevate legs at home and to cut down wraps if they get wet. [name] Kitchen RTC 7-10 days.    Plan; reduce swelling and drainage with compression wraps-monitor for S&S of infection.

## 2018-06-29 ENCOUNTER — Ambulatory Visit
Admission: RE | Admit: 2018-06-29 | Discharge: 2018-06-29 | Disposition: A | Discharge status: Home / Self Care | Payer: Medicare (Managed Care) | Source: Ambulatory Visit | Attending: Family Medicine | Admitting: Family Medicine

## 2018-06-29 DIAGNOSIS — L97921 Non-pressure chronic ulcer of unspecified part of left lower leg limited to breakdown of skin: Secondary | ICD-10-CM | POA: Diagnosis not present

## 2018-06-29 DIAGNOSIS — L97811 Non-pressure chronic ulcer of other part of right lower leg limited to breakdown of skin: Secondary | ICD-10-CM | POA: Diagnosis not present

## 2018-06-29 DIAGNOSIS — L97911 Non-pressure chronic ulcer of unspecified part of right lower leg limited to breakdown of skin: Secondary | ICD-10-CM

## 2018-06-29 NOTE — Progress Notes (Signed)
Pt presents with right lateral calf traumatic ulcer of 5 weeks' duration related to hitting it on her walker. She has sen dermatology for this, as well as for bilateral lower extremity skin changes     Reports no pain from the wound, denies F/C/N/V.    Lidocaine applied-    The wound is full thickness, measuring 1.1 X 1.4 X 0.2 , with a red base- minimal serous drainage    The left leg has fragile closure      I debrided the wound with a size 3mm curette - she tolerated well    The wound was dressed with Aquacel Ag+ , moisturizer to both legs, compression with Coban 2 Lite. Pt was educated to elevate legs at home and to cut down wraps if they get wet. [name] Kitchen RTC 7-10 days.    Plan; reduce swelling and drainage with compression wraps-monitor for S&S of infection.

## 2018-06-29 NOTE — Patient Instructions (Signed)
Multi-Layer Compression wraps    . The goal of compression wrapping is to support the veins and remove fluid from the legs. This will reduce the pressure in the leg and aid in wound healing.    . This wrap is to be replaced by the wound center staff on a weekly basis.    . If you have pneumatic pumps you should continue to use them while in compression wraps.    . Showers may be taken only if the wrap is securely covered with a plastic wrap. A cast cover may be useful and can be picked up at a pharmacy.    . If your leg itches under the wrap do not stick anything under the wrap to scratch the itch. This risks the creation of new wounds.    . Let the wound center team know if you have itching on your next visit.    . Elevate your legs, higher than your heart, often during the day. Especially after extended periods of walking or sitting with your legs hanging down.    . There may be drainage on the bandage. These areas may be covered with gauze or paper towels to prevent soiling clothing or linens.    . If drainage is excessive, call the wound center for an earlier appointment.    . If the wrap gets wet, or if you experience pain, numbness, tingling, discoloration, or swelling of the toes that is not relieved with rest, remove the compression wrap promptly and contact the wound care center.     . If the wrap rolls or slides down the leg, snip the roll to relieve pressure and provide comfort.    . It is recommended that you only use safety scissors, bandage scissors, or EMT shears to cut the compression wrap. Normal scissors/shears are not recommended as the sharp edges may cause another wound.    . If you do not have access to these tools it is recommended that you unwrap the outer layer by hand.     . If you have to remove the wrap make sure the wound is covered with a dressing. If you have access to compression garments you may use those until you can return to the wound center.    . Seek immediate medical care if  you notice foul odor, increasing pain, increasing drainage, fever, chills, nausea, and/or red streaking up the leg.

## 2018-07-01 DIAGNOSIS — M9902 Segmental and somatic dysfunction of thoracic region: Secondary | ICD-10-CM | POA: Diagnosis not present

## 2018-07-01 DIAGNOSIS — M9906 Segmental and somatic dysfunction of lower extremity: Secondary | ICD-10-CM | POA: Diagnosis not present

## 2018-07-01 DIAGNOSIS — M62830 Muscle spasm of back: Secondary | ICD-10-CM | POA: Diagnosis not present

## 2018-07-01 DIAGNOSIS — M9907 Segmental and somatic dysfunction of upper extremity: Secondary | ICD-10-CM | POA: Diagnosis not present

## 2018-07-01 DIAGNOSIS — M9903 Segmental and somatic dysfunction of lumbar region: Secondary | ICD-10-CM | POA: Diagnosis not present

## 2018-07-01 DIAGNOSIS — M9901 Segmental and somatic dysfunction of cervical region: Secondary | ICD-10-CM | POA: Diagnosis not present

## 2018-07-06 ENCOUNTER — Ambulatory Visit
Admission: RE | Admit: 2018-07-06 | Discharge: 2018-07-06 | Disposition: A | Discharge status: Home / Self Care | Payer: Medicare (Managed Care) | Source: Ambulatory Visit | Attending: Family Medicine | Admitting: Family Medicine

## 2018-07-06 DIAGNOSIS — I87311 Chronic venous hypertension (idiopathic) with ulcer of right lower extremity: Secondary | ICD-10-CM | POA: Diagnosis not present

## 2018-07-06 DIAGNOSIS — L97912 Non-pressure chronic ulcer of unspecified part of right lower leg with fat layer exposed: Secondary | ICD-10-CM | POA: Diagnosis not present

## 2018-07-06 DIAGNOSIS — L97921 Non-pressure chronic ulcer of unspecified part of left lower leg limited to breakdown of skin: Secondary | ICD-10-CM | POA: Diagnosis not present

## 2018-07-06 NOTE — Progress Notes (Signed)
[facility] [place] [facility] History and Physical Exam      Patient Name: Barbara [name],Barbara [name]  Encounter Date:  07/06/2018  Attending Physician: Theodora Blow, MD  PCP: Geraldo Docker, Wallie Renshaw, MD  Patient DOB:  August 16, 1928  MRN:  [id number]        History of Presenting Illness     Chief complaint: wound care follow up      [facility] Barbara [name] is a 84 y.o. female with a history of obesity, lymphedema, and presumed venous insufficiency.  She is followed at the wound care center for traumatic ulcerations of the bilateral lower extremities.  She's been treated with Aquacel silver dressings and Coban 2 wraps for compression.  She has tolerated this well and has been steadily improving.  She arrives today in the company of her caregiver and daughter.  Her daughter intends to take her on a 2 to 4-week trip to [place] and requests that we use alternate compression and avoid compression wraps.      Review of Systems     Review of Systems   The patient denies fever, chills, nausea, vomiting or other constitutional symptoms.  Allergies     Pt is allergic to codeine; erythromycin; iodine; penicillins; percocet [oxycodone-acetaminophen]; sulfa antibiotics; tramadol; and zithromax [azithromycin].    Medications       Current Outpatient Medications:   .  B Complex Vitamins (VITAMIN-B COMPLEX) Tab, 1 tablet po QD, Disp: , Rfl:   .  bumetanide (BUMEX) 1 MG tablet, Take 1 mg by mouth 2 (two) times daily, Disp: , Rfl: 1  .  gabapentin (NEURONTIN) 300 MG capsule, Take 1 capsule (300 mg total) by mouth 3 (three) times daily. (Patient taking differently: Take 600 mg by mouth 2 (two) times daily.  ), Disp: 60 capsule, Rfl: 0  .  levothyroxine (SYNTHROID, LEVOTHROID) 88 MCG tablet, 88 mcg daily.  , Disp: , Rfl:   .  losartan (COZAAR) 100 MG tablet, Take 100 mg by mouth daily., Disp: , Rfl:   .  metoprolol (LOPRESSOR) 50 MG tablet, Take 50 mg by mouth 2 (two) times daily., Disp: , Rfl:   .  pantoprazole (PROTONIX) 40 MG tablet,  Take 40 mg by mouth daily., Disp: , Rfl:   .  spironolactone (ALDACTONE) 50 MG tablet, TAKE 1 TAB (50 MG TOTAL) BY MOUTH EVERY MORNING WITH BREAKFAST, Disp: , Rfl: 1  .  vitamin D (CHOLECALCIFEROL) 1000 UNIT tablet, Take 1,000 Units by mouth daily, Disp: , Rfl:   .  warfarin (COUMADIN) 1 MG tablet, Take 1 mg by mouth Every tues, thurs, sat, sun , Disp: , Rfl:   .  warfarin (COUMADIN) 2 MG tablet, Take 2 mg by mouth Once every Monday, Wednesday, and Friday evening  , Disp: , Rfl:      Past Medical History     Pt has a past medical history of A-fib, Arthritis, Congestive heart failure, Dementia, Disorder of thyroid, Dysphagia, Gastroesophageal reflux disease, Hernia, hiatal, HTN (hypertension), Pulmonary embolism, PVD (peripheral vascular disease), and Seasonal allergic rhinitis.    Past Surgical History     Pt has a past surgical history that includes left arm; Hysterectomy; Colonoscopy; and EGD (N/A, 12/17/2016).    Family History     The family history includes No known problems in her father and mother.    Social History     Pt reports that she has never smoked. She has never used smokeless tobacco. She reports that she does not  drink alcohol or use drugs.    Physical Exam     There were no vitals taken for this visit.     Patient arrives ambulatory and in no acute distress.  She is alert and oriented x3 and nontoxic-appearing.  Takedown of her dressings reveals partially compressed legs with 1+ edema noted bilaterally.  Her left lower extremity wound is pinpoint in her right lower extremity wound measures 1.1 x 1.0 x 0.2 cm.  Both wound bases are red and granular.  Wound edges are pink and surrounding skin is dark with hemosiderin staining but intact.  No wound odor or grossly purulent drainage.  No surrounding erythema or induration.      Procedures     None    Diagnosis / Disposition     Clinical Impression  1. Skin ulcer of right lower leg with fat layer exposed    2. Skin ulcer of left lower leg, limited to  breakdown of skin        Plan: We will use Aquacel silver on the wounds covered with a bordered gauze and double layer Tubigrip for compression.  Follow-up in the wound care center when she returns to the area.    Prescriptions  New Prescriptions    No medications on file           This note has been electronically signed by Theodora Blow, M.D.        *This note was generated by the Epic EMR system/ Dragon speech recognition and may contain inherent errors or omissions not intended by the user. Grammatical errors, random word insertions, deletions, pronoun errors and incomplete sentences are occasional consequences of this technology due to software limitations. Not all errors are caught or corrected. If there are questions or concerns about the content of this note or information contained within the body of this dictation they should be addressed directly with the author for clarification

## 2018-07-06 NOTE — Progress Notes (Signed)
Pt presents for evaluation of right anterior and left anterior traumatic ulcerations complicated by venous insufficiency. Reports no pain from the wound, denies F/C/N/V. She has tolerated compression well. She would like to not be wrapped as she will be traveling to [facility] for an extended stay.     The wound measures 1.1 x 1.0 x 0.1 to the right anterior leg. The base is yellow moist biofilm covering red granulation. The wound is full-thickness. There is minimal to moderate sero/sang drainage noted. The left anterior leg measures 0.1 x 0.1 x 0.1 with a red moist base. The wound is partial thickness. There is minimal to moderate drainage noted.     I debrided the right anterior with a 3 mm curette. Pt tolerated well. Depth is increased to 0.2 cm. Vashe soak applied. The left was non-selectively debrided with gauze. Dr. Roseanna Rainbow into evaluate and discuss treatment plan. We will order the pt compression garments and dressings to be changed every 3 days.     The wound was dressed with Aquacel Ag and covered with bordered gauze. I applied Tubigrip "F" double layer to both lower legs. The pt's caregivers were educated on dressing changes and to remove Tubigrip if it becomes too tight.      POC:  reduce swelling with compression   monitor for S&S of infection.   Goal is wound healing

## 2018-07-06 NOTE — Patient Instructions (Signed)
Compression stockings    . The goal of stockings is to support the veins and help maintain healthy leg circulation. This will help to aid in healing and/or prevent the return of leg ulcers and leg swelling.    . Wear stockings every day.    . Put them on first thing in the morning.  o Moisturize legs at night as stockings are difficult to apply on moist legs  o DO NOT stretch socks up the leg but apply with short strokes or they will stretch beyond the knee palcement    . If sleeping in a bed, remove them at night. If sleeping in a chair they may be left on.    . If you have intermittent pneumatic pumps you should continue to use them while in compression stockings.    . Keep pressure off of the toes.    . Socks should be clean and have no wrinkles.    . Check socks throughout the day. If they slide down make sure to pull them up. Straighten out wrinkles.  o Roll on adhesive may assist keeping socks in place    . Follow washing/drying instructions.  o Most can be put in washer and dryer- place in lingerie bag so they are not stretched out    . Replace socks every 2-3 months of wear.  o Keep box label of sock brand and size in secure place such as a sock drawer to make reordering easy  o If socks are too tight then most likely you are swollen and need to take measures for edema reduction and/or call the Wound Care Service    . Having multiple pair is recommended.    . If they are easy to put on it is time to replace them.

## 2018-07-07 DIAGNOSIS — Z961 Presence of intraocular lens: Secondary | ICD-10-CM | POA: Diagnosis not present

## 2018-07-07 DIAGNOSIS — H353131 Nonexudative age-related macular degeneration, bilateral, early dry stage: Secondary | ICD-10-CM | POA: Diagnosis not present

## 2018-07-08 DIAGNOSIS — Z7901 Long term (current) use of anticoagulants: Secondary | ICD-10-CM | POA: Diagnosis not present

## 2018-07-08 DIAGNOSIS — E039 Hypothyroidism, unspecified: Secondary | ICD-10-CM | POA: Diagnosis not present

## 2018-07-08 DIAGNOSIS — Z0000 Encounter for general adult medical examination without abnormal findings: Secondary | ICD-10-CM | POA: Diagnosis not present

## 2018-07-08 DIAGNOSIS — Z5181 Encounter for therapeutic drug level monitoring: Secondary | ICD-10-CM | POA: Diagnosis not present

## 2018-07-08 DIAGNOSIS — I482 Chronic atrial fibrillation, unspecified: Secondary | ICD-10-CM | POA: Diagnosis not present

## 2018-07-08 DIAGNOSIS — N390 Urinary tract infection, site not specified: Secondary | ICD-10-CM | POA: Diagnosis not present

## 2018-07-27 DIAGNOSIS — R399 Unspecified symptoms and signs involving the genitourinary system: Secondary | ICD-10-CM | POA: Diagnosis not present

## 2018-07-27 DIAGNOSIS — R269 Unspecified abnormalities of gait and mobility: Secondary | ICD-10-CM | POA: Diagnosis not present

## 2018-07-27 DIAGNOSIS — I5022 Chronic systolic (congestive) heart failure: Secondary | ICD-10-CM | POA: Diagnosis not present

## 2018-07-27 DIAGNOSIS — Z7901 Long term (current) use of anticoagulants: Secondary | ICD-10-CM | POA: Diagnosis not present

## 2018-07-27 DIAGNOSIS — Z86711 Personal history of pulmonary embolism: Secondary | ICD-10-CM | POA: Diagnosis not present

## 2018-08-07 DIAGNOSIS — M19071 Primary osteoarthritis, right ankle and foot: Secondary | ICD-10-CM | POA: Diagnosis not present

## 2018-08-07 DIAGNOSIS — M1712 Unilateral primary osteoarthritis, left knee: Secondary | ICD-10-CM | POA: Diagnosis not present

## 2018-08-07 DIAGNOSIS — M1711 Unilateral primary osteoarthritis, right knee: Secondary | ICD-10-CM | POA: Diagnosis not present

## 2018-08-19 DIAGNOSIS — I87311 Chronic venous hypertension (idiopathic) with ulcer of right lower extremity: Secondary | ICD-10-CM | POA: Diagnosis not present

## 2018-09-07 DIAGNOSIS — T148XXA Other injury of unspecified body region, initial encounter: Secondary | ICD-10-CM | POA: Diagnosis not present

## 2018-09-20 DIAGNOSIS — Z7901 Long term (current) use of anticoagulants: Secondary | ICD-10-CM | POA: Diagnosis not present

## 2018-10-14 DIAGNOSIS — R946 Abnormal results of thyroid function studies: Secondary | ICD-10-CM | POA: Diagnosis not present

## 2018-10-14 DIAGNOSIS — I10 Essential (primary) hypertension: Secondary | ICD-10-CM | POA: Diagnosis not present

## 2018-10-14 DIAGNOSIS — Z7901 Long term (current) use of anticoagulants: Secondary | ICD-10-CM | POA: Diagnosis not present

## 2018-10-14 DIAGNOSIS — R5383 Other fatigue: Secondary | ICD-10-CM | POA: Diagnosis not present

## 2018-10-14 DIAGNOSIS — Z79899 Other long term (current) drug therapy: Secondary | ICD-10-CM | POA: Diagnosis not present

## 2018-10-15 DIAGNOSIS — Z79899 Other long term (current) drug therapy: Secondary | ICD-10-CM | POA: Diagnosis not present

## 2018-10-22 DIAGNOSIS — Z7901 Long term (current) use of anticoagulants: Secondary | ICD-10-CM | POA: Diagnosis not present

## 2018-10-22 DIAGNOSIS — E0590 Thyrotoxicosis, unspecified without thyrotoxic crisis or storm: Secondary | ICD-10-CM | POA: Diagnosis not present

## 2018-10-29 DIAGNOSIS — I10 Essential (primary) hypertension: Secondary | ICD-10-CM | POA: Diagnosis not present

## 2018-10-29 DIAGNOSIS — E0590 Thyrotoxicosis, unspecified without thyrotoxic crisis or storm: Secondary | ICD-10-CM | POA: Diagnosis not present

## 2018-10-29 DIAGNOSIS — Z7901 Long term (current) use of anticoagulants: Secondary | ICD-10-CM | POA: Diagnosis not present

## 2018-11-08 ENCOUNTER — Ambulatory Visit: Payer: Medicare HMO | Attending: Family Medicine | Admitting: Physical Therapy

## 2018-11-08 ENCOUNTER — Telehealth: Payer: Self-pay

## 2018-11-08 ENCOUNTER — Other Ambulatory Visit: Payer: Self-pay

## 2018-11-08 ENCOUNTER — Encounter: Payer: Self-pay | Admitting: Physical Therapy

## 2018-11-08 DIAGNOSIS — M25512 Pain in left shoulder: Secondary | ICD-10-CM | POA: Diagnosis present

## 2018-11-08 DIAGNOSIS — G8929 Other chronic pain: Secondary | ICD-10-CM | POA: Insufficient documentation

## 2018-11-08 DIAGNOSIS — M6281 Muscle weakness (generalized): Secondary | ICD-10-CM

## 2018-11-08 DIAGNOSIS — M25511 Pain in right shoulder: Secondary | ICD-10-CM | POA: Diagnosis present

## 2018-11-08 DIAGNOSIS — R2689 Other abnormalities of gait and mobility: Secondary | ICD-10-CM | POA: Diagnosis present

## 2018-11-08 DIAGNOSIS — R293 Abnormal posture: Secondary | ICD-10-CM | POA: Diagnosis present

## 2018-11-08 DIAGNOSIS — Z7901 Long term (current) use of anticoagulants: Secondary | ICD-10-CM | POA: Diagnosis not present

## 2018-11-08 NOTE — Therapy (Signed)
[facility] [address], [address] [place], [place], [address] Phone: [phone]   Fax:  [phone]  Physical Therapy Evaluation  Patient Details  Name: [name] MRN: [id number] Date of Birth: 1928-05-01 Referring Provider (PT): Shirlean Mylar, MD   Encounter Date: 11/08/2018  PT End of Session - 11/08/18 1943    Visit Number  1    Date for PT Re-Evaluation  01/03/19    Authorization Type  [employer]    PT Start Time  1450    PT Stop Time  1530    PT Time Calculation (min)  40 min    Equipment Utilized During Treatment  Other (comment)   manual W/C, 2-wheel walker   Activity Tolerance  Patient limited by fatigue;Patient limited by pain;Patient limited by lethargy    Behavior During Therapy  [facility] for tasks assessed/performed       Past Medical History:  Diagnosis Date  . Anticoagulant long-term use   . Essential hypertension   . Pulmonary embolus (HCC)     History reviewed. No pertinent surgical history.  There were no vitals filed for this visit.   Subjective Assessment - 11/08/18 1454    Subjective  Pt is an 85yo female presenting in manual W/C and is accompanied by her adult daughter with whom she lives.  She has been referred to PT for gait abnormality and bil shoulder pain.  She has pain in both shoulders and upper arms (points to biceps).  Pain is intermittent and wakes her at night.  She has trouble reaching above shoulder height bilaterally.  Mostly lays on Rt shoulder and back for sleep due to increased Lt shoulder pain in Lt sidelying.  Tylenol helps somewhat.  Pt uses 2 wheel walker at home and is quite limited in how much she can walk due to bil knee pain and weakness of LEs.  She uses manual W/C for all community ambulation.  She is on long-term anticoagulants (Warfarin) due to history of pulmonary emboli x 2 episodes.     Patient is accompained by:  Family member   daughter   Pertinent History  long-term  anticoagulants (Warfarin) due to history of pulmonary emboli x 2 episodes, mostly sedentary, uses 2 wheel walker in home and manual W/C in community    Limitations  Standing;Walking;House hold activities    How long can you stand comfortably?  2-5 min    How long can you walk comfortably?  2-5 min, limited by cardiovascular fatigue and bil knee pain    Diagnostic tests  none for shoulders    Patient Stated Goals  walk better with less knee pain, less pain in shoulders, daughter would like new walker ordered through Pt's insurance    Currently in Pain?  No/denies   bil shoulders can get up to 6 or 7/10        [facility] PT Assessment - 11/08/18 0001      Assessment   Medical Diagnosis  R26.89 (ICD-10-CM) - Other abnormalities of gait and mobility  M25.511 (ICD-10-CM) - Pain in right shoulder M25.512 (ICD-10-CM) - Pain in left shoulder    Referring Provider (PT)  Shirlean Mylar, MD    Onset Date/Surgical Date  --   over 10 years ago   Hand Dominance  Right    Next MD Visit  --   12/27/18   Prior Therapy  yes, home health      Precautions   Precautions  Fall  Restrictions   Weight Bearing Restrictions  No      Balance Screen   Has the patient fallen in the past 6 months  No    Has the patient had a decrease in activity level because of a fear of falling?   No    Is the patient reluctant to leave their home because of a fear of falling?   No      Home Public house manager residence    Living Arrangements  Children;Other relatives   lives with adult daughter and son in law   Type of Home  House    Home Access  Ramped entrance    Home Layout  One level    Home Equipment  Walker - 2 wheels;Wheelchair - manual;Toilet riser    Additional Comments  bathes at sink to avoid in/out of shower/tub      Prior Function   Level of Independence  Independent with household mobility with device;Requires assistive device for independence    Vocation  Retired    Leisure  [employer], TV, talks on phone      Cognition   Overall Cognitive Status  Within Functional Limits for tasks assessed      Observation/Other Assessments   Observations  significant crepitus noted in bil knees and shoulders on ROM/strength testing    Focus on Therapeutic Outcomes (FOTO)   54%   goal 43%     Observation/Other Assessments-Edema    Edema  --   bil LE pitting edema present     Posture/Postural Control   Posture Comments  Pt uses Lt hand on walker but leans on Rt forearm vs hand due to needing more support for her Rt knee      ROM / Strength   AROM / PROM / Strength  AROM;Strength;PROM      AROM   Overall AROM Comments  bil shoulders flexion 95 deg, abduction 70 deg, uses max elbow flexion and neck flexion to touch back of head, cannot reach past hips for functional ext/IR      PROM   Overall PROM Comments  bil shoulder flexion 100 deg with firm end feel, pain reported bil at end range      Strength   Overall Strength Comments  grossly 4-/5 bil LEs, 3+/5 bil ankle DF    Strength Assessment Site  Shoulder    Right/Left Shoulder  Right;Left    Right Shoulder Flexion  4/5    Right Shoulder Extension  4/5    Right Shoulder ABduction  3+/5    Right Shoulder Internal Rotation  4-/5    Right Shoulder External Rotation  3+/5    Left Shoulder Flexion  4/5    Left Shoulder Extension  4/5    Left Shoulder ABduction  3+/5    Left Shoulder Internal Rotation  4-/5    Left Shoulder External Rotation  3+/5      Transfers   Transfers  Sit to Stand;Stand to Sit    Sit to Stand  6: Modified independent (Device/Increase time);Multiple attempts;With armrests   uses momentum and reaches for walker   Stand to Sit  6: Modified independent (Device/Increase time);With upper extremity assist;Uncontrolled descent;With armrests      Ambulation/Gait   Ambulation/Gait  Yes    Ambulation/Gait Assistance  6: Modified independent (Device/Increase time)    Ambulation Distance (Feet)  30 Feet     Assistive device  Rolling walker   2 wheel walker  Gait Pattern  Step-through pattern;Decreased step length - right;Decreased step length - left;Decreased stance time - right;Decreased stance time - left;Decreased stride length;Antalgic;Right flexed knee in stance;Left flexed knee in stance;Decreased weight shift to right    Ambulation Surface  Level;Indoor    Gait Comments  Pt leans on Rt forearm on walker due to needing more support for Rt knee      Standardized Balance Assessment   Standardized Balance Assessment  Timed Up and Go Test      Timed Up and Go Test   TUG  Normal TUG    Normal TUG (seconds)  37    TUG Comments  with 2 wheel walker, forearm weight bearning on Rt                Objective measurements completed on examination: See above findings.                PT Short Term Goals - 11/08/18 2018      PT SHORT TERM GOAL #1   Title  Pt will be I in initial HEP for gentle UE/LE ROM and strength.    Time  4    Period  Weeks    Status  New    Target Date  12/06/18      PT SHORT TERM GOAL #2   Title  Pt will be able to demonstrate sit to stand and stand to sit x 3 trials with proper body mechanics and safety with use of bil UEs and walker.    Time  4    Period  Weeks    Status  New    Target Date  12/06/18      PT SHORT TERM GOAL #3   Title  Pt will report at least 20% reduction in bil shoulder pain to improve tasks such as shoulder height UE use and sleeping with less pain.    Time  4    Period  Weeks    Status  New    Target Date  12/06/18      PT SHORT TERM GOAL #4   Title  Pt will be able to ambulate at least 80 feet with weight bearing through bil hands on walker demonstrating improved strength and confidence in Rt LE/knee.    Baseline  bears weight through Rt forearm and Lt hand    Time  4    Period  Weeks    Status  New    Target Date  12/06/18        PT Long Term Goals - 11/08/18 2023      PT LONG TERM GOAL #1   Title  Pt  will be I in advanced HEP targeting UE/LE ROM, strength, balance and gait endurance.    Time  8    Period  Weeks    Status  New    Target Date  01/03/19      PT LONG TERM GOAL #2   Title  Pt will achieve strength rating of at least 4/5 throughout bil UEs/LEs for improved daily task performance such as gait and ADLs.    Time  8    Period  Weeks    Status  New    Target Date  01/03/19      PT LONG TERM GOAL #3   Title  Pt will reduce FOTO to </= 43% to demo reduced limitation in daily activities.    Time  8    Period  Weeks  Status  New    Target Date  01/03/19      PT LONG TERM GOAL #4   Title  Pt will perform TUG in </= 32 sec demonstrating reduction in fall risk.    Time  8    Period  Weeks    Status  New    Target Date  01/03/19      PT LONG TERM GOAL #5   Title  Pt will ambulate at least 200 feet on level surface with walker, weight bearing through bil hands on walker, to improve household ambulation and demo progress towards community ambulation.    Baseline  weight bears through Rt forearm and Lt hand    Time  8    Period  Weeks    Status  New    Target Date  01/03/19             Plan - 11/08/18 1954    Clinical Impression Statement  Pt is a pleasant 85yo female accompanied by her adult daughter with whom she lives.  She is referred to PT for gait abnormality and bil shoulder pain.  She has become progressively inactive and dependent on walker and W/C.  Shoulder pain has been present for 10+ years.  Pt currently performs very limited household ambulation with use of a 2-wheel walker and uses manual push W/C for all community outings.  Ambulation is limited by bil knee pain/weakness as well as poor cardiovascular health.  Pt performs Rt forearm weightbearing through walker secondary to feeling too weak and painful through Rt knee.  Transfers for sit to stand required multiple attempts and use of momentum/bil UEs.  TUG was 37 seconds placing her in category of high  fall risk.  She has significant pain and restriction in range of motion and strength in bil shoulders and knees.  Significant crepitus noted by PT in bil shoulders and bil knees.  Pt needed bathroom break during session so no treatment performed today as time was limited.  Pt's daughter hopes treating PT will work with referring MD on recommendation for new walker through insurance.  Pt will benefit from skilled PT for gait training, transfer training, UE/LE strength, UE/LE ROM, endurance, balance and Pt education to improve her safety, overall quality of life, and participation in daily activities in and out of the home.    Personal Factors and Comorbidities  Fitness;Age;Time since onset of injury/illness/exacerbation    Examination-Activity Limitations  Bathing;Transfers;Locomotion Level;Reach Overhead;Bend;Sleep;Continence;Stand;Toileting;Stairs;Carry;Dressing;Squat;Bed Mobility    Examination-Participation Restrictions  Driving;Cleaning;Meal Prep;Shop;Community Activity;Yard Work;Laundry;Medication Management    Stability/Clinical Decision Making  Evolving/Moderate complexity    Clinical Decision Making  Moderate    Rehab Potential  Good    PT Frequency  2x / week    PT Duration  8 weeks    PT Treatment/Interventions  ADLs/Self Care Home Management;Cryotherapy;Electrical Stimulation;Iontophoresis 4mg /ml Dexamethasone;Moist Heat;Gait training;DME Instruction;Stair training;Functional mobility training;Therapeutic activities;Therapeutic exercise;Balance training;Neuromuscular re-education;Patient/family education;Wheelchair mobility training;Manual techniques;Passive range of motion;Energy conservation;Taping;Vasopneumatic Device;Joint Manipulations;Spinal Manipulations    PT Next Visit Plan  begin HEP, trial shoulder pulleys, gait training with ongoing assessment of best assistive device, seated LE strength, transfer training, sit to stand, pre-gait activities   ask Pt's daughter for complete medical  history list and update in chart   PT Home Exercise Plan  begin next visit, time limited today    Consulted and Agree with Plan of Care  Patient;Family member/caregiver    Family Member Consulted  Pt's daughter       Patient  will benefit from skilled therapeutic intervention in order to improve the following deficits and impairments:  Abnormal gait, Decreased coordination, Decreased range of motion, Difficulty walking, Cardiopulmonary status limiting activity, Decreased endurance, Impaired UE functional use, Obesity, Decreased activity tolerance, Pain, Decreased balance, Hypomobility, Impaired flexibility, Improper body mechanics, Decreased mobility, Decreased strength, Increased edema, Postural dysfunction  Visit Diagnosis: Other abnormalities of gait and mobility - Plan: PT plan of care cert/re-cert  Chronic left shoulder pain - Plan: PT plan of care cert/re-cert  Chronic right shoulder pain - Plan: PT plan of care cert/re-cert  Muscle weakness (generalized) - Plan: PT plan of care cert/re-cert  Abnormal posture - Plan: PT plan of care cert/re-cert     Problem List There are no active problems to display for this patient.  Loistine Simas Averi Kilty, PT 11/08/18 8:30 PM   [facility] [address] [place], [place], [address] Phone: [phone]   Fax:  [phone]  Name: [name] MRN: [id number] Date of Birth: [birth year]

## 2018-11-08 NOTE — Telephone Encounter (Signed)
NOTES ON FILE 

## 2018-11-15 ENCOUNTER — Encounter: Payer: Self-pay | Admitting: Physical Therapy

## 2018-11-15 ENCOUNTER — Ambulatory Visit: Payer: Medicare HMO | Admitting: Physical Therapy

## 2018-11-15 DIAGNOSIS — G8929 Other chronic pain: Secondary | ICD-10-CM | POA: Diagnosis not present

## 2018-11-15 DIAGNOSIS — R293 Abnormal posture: Secondary | ICD-10-CM

## 2018-11-15 DIAGNOSIS — M25511 Pain in right shoulder: Secondary | ICD-10-CM | POA: Diagnosis not present

## 2018-11-15 DIAGNOSIS — M6281 Muscle weakness (generalized): Secondary | ICD-10-CM | POA: Diagnosis not present

## 2018-11-15 DIAGNOSIS — R2689 Other abnormalities of gait and mobility: Secondary | ICD-10-CM | POA: Diagnosis not present

## 2018-11-15 DIAGNOSIS — M25512 Pain in left shoulder: Secondary | ICD-10-CM | POA: Diagnosis not present

## 2018-11-15 NOTE — Therapy (Signed)
[facility] [address] [place], [place], [address] Phone: [phone]   Fax:  [phone]  Physical Therapy Treatment  Patient Details  Name: [name] MRN: [id number] Date of Birth: 1927/11/26 Referring Provider (PT): Shirlean Mylar, MD   Encounter Date: 11/15/2018  PT End of Session - 11/15/18 1448    Visit Number  2    Date for PT Re-Evaluation  01/03/19    Authorization Type  [employer]na Medicare    PT Start Time  1445    PT Stop Time  1525    PT Time Calculation (min)  40 min    Equipment Utilized During Treatment  Other (comment)   Rolling walker   Activity Tolerance  Patient tolerated treatment well    Behavior During Therapy  [facility] for tasks assessed/performed       Past Medical History:  Diagnosis Date  . Anticoagulant long-term use   . Essential hypertension   . Pulmonary embolus (HCC)     History reviewed. No pertinent surgical history.  There were no vitals filed for this visit.  Subjective Assessment - 11/15/18 1450    Subjective  Pt arrives accompanied by her daughter in her WC.    Patient is accompained by:  Family member    Currently in Pain?  Yes   Shoulders have a miserable ache today and her leg hurt all night.    Multiple Pain Sites  No                       [facility] Adult PT Treatment/Exercise - 11/15/18 0001      Transfers   Sit to Stand  6: Modified independent (Device/Increase time);With upper extremity assist   3x in a row, improved hand placement     Ambulation/Gait   Ambulation/Gait  Yes    Ambulation/Gait Assistance  6: Modified independent (Device/Increase time)    Ambulation Distance (Feet)  80 Feet   40 ft x 2 with RW behind   Assistive device  Rolling walker    Gait Pattern  Step-through pattern    Ambulation Surface  Level;Indoor    Gait Comments  Pt able to do with both hands on the walker handles.       Knee/Hip Exercises: Standing   Other Standing Knee  Exercises  Standing at walker x 50 sec             PT Education - 11/15/18 1531    Education Details  Initial HEP started: see [employer]    Person(s) Educated  Patient;Caregiver(s)    Methods  Explanation;Demonstration;Verbal cues;Handout    Comprehension  Returned demonstration;Verbalized understanding   Demo about 2-5x of each ex      PT Short Term Goals - 11/08/18 2018      PT SHORT TERM GOAL #1   Title  Pt will be I in initial HEP for gentle UE/LE ROM and strength.    Time  4    Period  Weeks    Status  New    Target Date  12/06/18      PT SHORT TERM GOAL #2   Title  Pt will be able to demonstrate sit to stand and stand to sit x 3 trials with proper body mechanics and safety with use of bil UEs and walker.    Time  4    Period  Weeks    Status  New    Target Date  12/06/18  PT SHORT TERM GOAL #3   Title  Pt will report at least 20% reduction in bil shoulder pain to improve tasks such as shoulder height UE use and sleeping with less pain.    Time  4    Period  Weeks    Status  New    Target Date  12/06/18      PT SHORT TERM GOAL #4   Title  Pt will be able to ambulate at least 80 feet with weight bearing through bil hands on walker demonstrating improved strength and confidence in Rt LE/knee.    Baseline  bears weight through Rt forearm and Lt hand    Time  4    Period  Weeks    Status  New    Target Date  12/06/18        PT Long Term Goals - 11/08/18 2023      PT LONG TERM GOAL #1   Title  Pt will be I in advanced HEP targeting UE/LE ROM, strength, balance and gait endurance.    Time  8    Period  Weeks    Status  New    Target Date  01/03/19      PT LONG TERM GOAL #2   Title  Pt will achieve strength rating of at least 4/5 throughout bil UEs/LEs for improved daily task performance such as gait and ADLs.    Time  8    Period  Weeks    Status  New    Target Date  01/03/19      PT LONG TERM GOAL #3   Title  Pt will reduce FOTO to </= 43% to  demo reduced limitation in daily activities.    Time  8    Period  Weeks    Status  New    Target Date  01/03/19      PT LONG TERM GOAL #4   Title  Pt will perform TUG in </= 32 sec demonstrating reduction in fall risk.    Time  8    Period  Weeks    Status  New    Target Date  01/03/19      PT LONG TERM GOAL #5   Title  Pt will ambulate at least 200 feet on level surface with walker, weight bearing through bil hands on walker, to improve household ambulation and demo progress towards community ambulation.    Baseline  weight bears through Rt forearm and Lt hand    Time  8    Period  Weeks    Status  New    Target Date  01/03/19            Plan - 11/15/18 1503    Clinical Impression Statement  Pt arrived to therapy today accompnaied by her daughter, in a WC. Initial HEP was started for shoulder AROM and AA/ROM exercises and LAQ. Pt had pretty lengthy sneezing fit that kept her from demonstrating more than a few repetitions. We discussed safety concerns regarding putting her hand on the front cross bar of her walker when she stands and walks versus using the handles. She reluctantly agreed to use the handles in the clinic. She voiced a strong preferance to put her forearm down on something to tak the weight off her shoulder & knee.  pt walked 40 ft 2x with a rolling walker, erect through her trunk, and hands on the proper handles.     Personal Factors and Comorbidities  Fitness;Age;Time since onset of injury/illness/exacerbation    Examination-Activity Limitations  Bathing;Transfers;Locomotion Level;Reach Overhead;Bend;Sleep;Continence;Stand;Toileting;Stairs;Carry;Dressing;Squat;Bed Mobility    Examination-Participation Restrictions  Driving;Cleaning;Meal Prep;Shop;Community Activity;Yard Work;Laundry;Medication Management    Stability/Clinical Decision Making  Evolving/Moderate complexity    Rehab Potential  Good    PT Frequency  2x / week    PT Duration  8 weeks    PT  Treatment/Interventions  ADLs/Self Care Home Management;Cryotherapy;Electrical Stimulation;Iontophoresis 4mg /ml Dexamethasone;Moist Heat;Gait training;DME Instruction;Stair training;Functional mobility training;Therapeutic activities;Therapeutic exercise;Balance training;Neuromuscular re-education;Patient/family education;Wheelchair mobility training;Manual techniques;Passive range of motion;Energy conservation;Taping;Vasopneumatic Device;Joint Manipulations;Spinal Manipulations    PT Next Visit Plan  Review HEP given today. Seated LE strength, shoulder isometrics,     Recommended Other Services  Access Code: [email or web address]    Consulted and Agree with Plan of Care  Patient;Family member/caregiver    Family Member Consulted  Pt's daughter       Patient will benefit from skilled therapeutic intervention in order to improve the following deficits and impairments:  Abnormal gait, Decreased coordination, Decreased range of motion, Difficulty walking, Cardiopulmonary status limiting activity, Decreased endurance, Impaired UE functional use, Obesity, Decreased activity tolerance, Pain, Decreased balance, Hypomobility, Impaired flexibility, Improper body mechanics, Decreased mobility, Decreased strength, Increased edema, Postural dysfunction  Visit Diagnosis: Other abnormalities of gait and mobility  Chronic left shoulder pain  Chronic right shoulder pain  Muscle weakness (generalized)  Abnormal posture     Problem List There are no active problems to display for this patient.   Deloy Archey, PTA 11/15/2018, 3:42 PM  [facility] [address], [address] [place], [place], [address] Phone: [phone]   Fax:  [phone]  Name: [name] MRN: [id number] Date of Birth: 03/16/1928  Access Code: [email or web address]  URL: [email or web address]  Date: 11/15/2018  Prepared by: Ane PaymentJennifer Hodaya Curto   Exercises  Seated Shoulder Rolls - 10 reps  - 1 sets - 3x daily - 7x weekly  Seated Scapular Retraction - 10 reps - 1 sets - 5 hold - 3x daily - 7x weekly  Supine Shoulder Flexion AAROM with Hands Clasped - 10 reps - 1 sets - 4x daily - 7x weekly  Seated Long Arc Quad - 5 reps - 1 sets - 5 hold - 3x daily - 7x weekly

## 2018-11-18 DIAGNOSIS — Z7901 Long term (current) use of anticoagulants: Secondary | ICD-10-CM | POA: Diagnosis not present

## 2018-11-19 DIAGNOSIS — E871 Hypo-osmolality and hyponatremia: Secondary | ICD-10-CM | POA: Diagnosis not present

## 2018-11-19 DIAGNOSIS — G4710 Hypersomnia, unspecified: Secondary | ICD-10-CM | POA: Diagnosis not present

## 2018-11-19 DIAGNOSIS — R5383 Other fatigue: Secondary | ICD-10-CM | POA: Diagnosis not present

## 2018-11-22 ENCOUNTER — Other Ambulatory Visit: Payer: Self-pay

## 2018-11-22 ENCOUNTER — Ambulatory Visit: Payer: Medicare HMO | Admitting: Physical Therapy

## 2018-11-22 ENCOUNTER — Encounter: Payer: Self-pay | Admitting: Physical Therapy

## 2018-11-22 DIAGNOSIS — R2689 Other abnormalities of gait and mobility: Secondary | ICD-10-CM

## 2018-11-22 DIAGNOSIS — M6281 Muscle weakness (generalized): Secondary | ICD-10-CM | POA: Diagnosis not present

## 2018-11-22 DIAGNOSIS — R293 Abnormal posture: Secondary | ICD-10-CM

## 2018-11-22 DIAGNOSIS — M25512 Pain in left shoulder: Secondary | ICD-10-CM | POA: Diagnosis not present

## 2018-11-22 DIAGNOSIS — G8929 Other chronic pain: Secondary | ICD-10-CM | POA: Diagnosis not present

## 2018-11-22 DIAGNOSIS — M25511 Pain in right shoulder: Secondary | ICD-10-CM

## 2018-11-22 NOTE — Therapy (Signed)
[facility] [address] [place], [place], [address] Phone: [phone]   Fax:  [phone]  Physical Therapy Treatment  Patient Details  Name: [name] MRN: [id number] Date of Birth: 1928-05-20 Referring Provider (PT): Shirlean Mylar, MD   Encounter Date: 11/22/2018  PT End of Session - 11/22/18 1450    Visit Number  3    Date for PT Re-Evaluation  01/03/19    Authorization Type  [employer]    PT Start Time  1450    PT Stop Time  1530    PT Time Calculation (min)  40 min    Equipment Utilized During Treatment  Other (comment)    Activity Tolerance  Patient tolerated treatment well    Behavior During Therapy  [facility] for tasks assessed/performed       Past Medical History:  Diagnosis Date  . Anticoagulant long-term use   . Arrhythmia   . Arthritis   . Atrial fibrillation (HCC)   . Congestive heart failure (CHF) (HCC)   . Dysphagia   . Esophageal reflux   . Essential hypertension   . Hiatal hernia   . Pulmonary embolus (HCC)   . Thyroid disease     Past Surgical History:  Procedure Laterality Date  . COLONOSCOPY    . ELBOW SURGERY    . LAPAROSCOPIC HYSTERECTOMY      There were no vitals filed for this visit.  Subjective Assessment - 11/22/18 1453    Subjective  Felt fine after last session. Don't feel too bad today.     Pertinent History  long-term anticoagulants (Warfarin) due to history of pulmonary emboli x 2 episodes, mostly sedentary, uses 2 wheel walker in home and manual W/C in community    Currently in Pain?  No/denies    Multiple Pain Sites  No                       [facility] Adult PT Treatment/Exercise - 11/22/18 0001      Ambulation/Gait   Ambulation/Gait Assistance  6: Modified independent (Device/Increase time)    Ambulation Distance (Feet)  90 Feet   45 feet 2x with seated rest break in between   Assistive device  Rolling walker    Gait Pattern  Step-through pattern     Ambulation Surface  Level;Indoor    Gait Comments  Pt able to do with both hands on the walker handles.    VC to safety when sitting     Knee/Hip Exercises: Seated   Long Arc Quad  Strengthening;Both;1 set;10 reps;Weights    Long Arc Quad Weight  2 lbs.   Vc for pt to count outloud to aide pt;s breathing   Knee/Hip Flexion  2# bil 10x, VC to count outloud      Shoulder Exercises: Seated   Other Seated Exercises  shoulder circles 10x     Other Seated Exercises  Hands clapsed flexion 5x      Shoulder Exercises: Pulleys   Flexion  2 minutes      Shoulder Exercises: Isometric Strengthening   Flexion  3X3"    Extension  3X5"   Bil   External Rotation  3X3"    ADduction  3X5"   Bil              PT Short Term Goals - 11/22/18 1618      PT SHORT TERM GOAL #1   Title  Pt will be I in initial  HEP for gentle UE/LE ROM and strength.    Time  4    Period  Weeks    Status  Achieved        PT Long Term Goals - 11/08/18 2023      PT LONG TERM GOAL #1   Title  Pt will be I in advanced HEP targeting UE/LE ROM, strength, balance and gait endurance.    Time  8    Period  Weeks    Status  New    Target Date  01/03/19      PT LONG TERM GOAL #2   Title  Pt will achieve strength rating of at least 4/5 throughout bil UEs/LEs for improved daily task performance such as gait and ADLs.    Time  8    Period  Weeks    Status  New    Target Date  01/03/19      PT LONG TERM GOAL #3   Title  Pt will reduce FOTO to </= 43% to demo reduced limitation in daily activities.    Time  8    Period  Weeks    Status  New    Target Date  01/03/19      PT LONG TERM GOAL #4   Title  Pt will perform TUG in </= 32 sec demonstrating reduction in fall risk.    Time  8    Period  Weeks    Status  New    Target Date  01/03/19      PT LONG TERM GOAL #5   Title  Pt will ambulate at least 200 feet on level surface with walker, weight bearing through bil hands on walker, to improve household  ambulation and demo progress towards community ambulation.    Baseline  weight bears through Rt forearm and Lt hand    Time  8    Period  Weeks    Status  New    Target Date  01/03/19            Plan - 11/22/18 1509    Clinical Impression Statement  Pt arrived accompanied by daughter. Pt is compliant with initial HEP. Added shoulder isometrics to HEP that her daughter will help her with at home. Pt again ambulated well using the RW with Bil UE. She sbecomes so fatigued that she choses to "plop" into the walker vs sitting slowly with control. She is fully aware of this and reports it's called "age." Also added 2# weights for LE strength today. This resistance was fatiguing to muscles and her breathing became labored. She did better when cued to count outloud.     Personal Factors and Comorbidities  Fitness;Age;Time since onset of injury/illness/exacerbation    Examination-Activity Limitations  Bathing;Transfers;Locomotion Level;Reach Overhead;Bend;Sleep;Continence;Stand;Toileting;Stairs;Carry;Dressing;Squat;Bed Mobility    Examination-Participation Restrictions  Driving;Cleaning;Meal Prep;Shop;Community Activity;Yard Work;Laundry;Medication Management    Stability/Clinical Decision Making  Evolving/Moderate complexity    Rehab Potential  Good    PT Frequency  2x / week    PT Duration  8 weeks    PT Treatment/Interventions  ADLs/Self Care Home Management;Cryotherapy;Electrical Stimulation;Iontophoresis 4mg /ml Dexamethasone;Moist Heat;Gait training;DME Instruction;Stair training;Functional mobility training;Therapeutic activities;Therapeutic exercise;Balance training;Neuromuscular re-education;Patient/family education;Wheelchair mobility training;Manual techniques;Passive range of motion;Energy conservation;Taping;Vasopneumatic Device;Joint Manipulations;Spinal Manipulations    PT Next Visit Plan  review shoulder isometrics, continue with LE strength.     PT Home Exercise Plan  Access code  [email or web address]    Consulted and Agree with Plan of Care  Patient;Family member/caregiver    Family  Member Consulted  Pt's daughter       Patient will benefit from skilled therapeutic intervention in order to improve the following deficits and impairments:  Abnormal gait, Decreased coordination, Decreased range of motion, Difficulty walking, Cardiopulmonary status limiting activity, Decreased endurance, Impaired UE functional use, Obesity, Decreased activity tolerance, Pain, Decreased balance, Hypomobility, Impaired flexibility, Improper body mechanics, Decreased mobility, Decreased strength, Increased edema, Postural dysfunction  Visit Diagnosis: Other abnormalities of gait and mobility  Chronic left shoulder pain  Chronic right shoulder pain  Abnormal posture  Muscle weakness (generalized)     Problem List There are no active problems to display for this patient.   Quavon Keisling, PTA 11/22/2018, 4:20 PM  [facility] [address] [place], [place], [address] Phone: [phone]   Fax:  [phone]  Name: [name] MRN: [id number] Date of Birth: 1928/03/30  Access Code: [email or web address]  URL: [email or web address]  Date: 11/22/2018  Prepared by: Ane Payment   Exercises  Seated Shoulder Rolls - 10 reps - 1 sets - 3x daily - 7x weekly  Seated Scapular Retraction - 10 reps - 1 sets - 5 hold - 3x daily - 7x weekly  Supine Shoulder Flexion AAROM with Hands Clasped - 10 reps - 1 sets - 4x daily - 7x weekly  Seated Long Arc Quad - 5 reps - 1 sets - 5 hold - 3x daily - 7x weekly  Isometric Shoulder Extension at Wall - 10 reps - 1 sets - 5 hold - 1x daily - 7x weekly  Isometric Shoulder Flexion at Wall - 10 reps - 1 sets - 5 hold - 1x daily - 7x weekly  Seated Isometric Shoulder External Rotation - 10 reps - 1 sets - 5 hold - 1x daily - 7x weekly  Isometric Shoulder Adduction - 10 reps - 3 sets - 1x daily -  7x weekly

## 2018-11-25 ENCOUNTER — Telehealth: Payer: Self-pay | Admitting: Cardiology

## 2018-11-25 NOTE — Telephone Encounter (Signed)
Appointment cancelled

## 2018-11-25 NOTE — Telephone Encounter (Signed)
   Primary Cardiologist:  [name] (new)  Patient contacted.  History reviewed.  No symptoms to suggest any unstable cardiac conditions.  Based on discussion, with current pandemic situation, we will be postponing this appointment.  If symptoms change, she has been instructed to contact our office.  Spoke to her daughter at length, with patient in the background. She has been feeling well overall, with the exception of some fatigue, which they are attributing to poor sleep. She has been seen routinely by Dr. Hyman Hopes and Dr. Kateri Plummer, last visit last week. We discussed options. Purpose of visit was to establish care after her move from [place]. We discussed that if she has any symptoms or concerns to call the office to have her seen sooner.  We will plan to reschedule in about 4 weeks.  Routing to C19 CANCEL pool for tracking (P CV DIV CV19 CANCEL).    Cardiac Questionnaire:    Since your last visit or hospitalization:    1. Have you been having new or worsening chest pain? no   2. Have you been having new or worsening shortness of breath? no 3. Have you been having new or worsening leg swelling, wt gain, or increase in abdominal girth (pants fitting more tightly)? no   4. Have you had any passing out spells? no    *A YES to any of these questions would result in the appointment being kept. *If all the answers to these questions are NO, we should indicate that given the current situation regarding the worldwide coronarvirus pandemic, at the recommendation of the CDC, we are looking to limit gatherings in our waiting area, and thus will reschedule their appointment beyond four weeks from today.     [name], MD  11/25/2018 12:24 PM         .

## 2018-11-26 ENCOUNTER — Ambulatory Visit: Payer: Medicare HMO | Admitting: Cardiology

## 2018-11-29 ENCOUNTER — Telehealth: Payer: Self-pay | Admitting: Physical Therapy

## 2018-11-29 ENCOUNTER — Encounter: Payer: Medicare HMO | Admitting: Physical Therapy

## 2018-11-29 NOTE — Telephone Encounter (Signed)
PTA called pt after office closure due to pandemic. Left message she would receive one more follow up call later in the week in case she had questions.   Ane Payment, PTA @TODAY @ 3:14 PM

## 2018-12-02 ENCOUNTER — Encounter: Payer: Medicare HMO | Admitting: Physical Therapy

## 2018-12-03 ENCOUNTER — Telehealth: Payer: Self-pay | Admitting: Cardiology

## 2018-12-03 DIAGNOSIS — Z7901 Long term (current) use of anticoagulants: Secondary | ICD-10-CM | POA: Diagnosis not present

## 2018-12-03 NOTE — Telephone Encounter (Signed)
New message    Patient's mom is calling to check on setting up a cancelled appointment for patient to see Dr. Cristal Deer per the message in the notes she would need to be seen in 4 weeks.

## 2018-12-06 ENCOUNTER — Encounter: Payer: Medicare HMO | Admitting: Physical Therapy

## 2018-12-06 NOTE — Telephone Encounter (Signed)
Follow up ° °Patient's daughter is returning your call. °

## 2018-12-06 NOTE — Telephone Encounter (Signed)
Left message to call back  

## 2018-12-07 NOTE — Telephone Encounter (Signed)
Left message to call back  

## 2018-12-09 ENCOUNTER — Encounter: Payer: Medicare HMO | Admitting: Physical Therapy

## 2018-12-10 DIAGNOSIS — Z7901 Long term (current) use of anticoagulants: Secondary | ICD-10-CM | POA: Diagnosis not present

## 2018-12-13 ENCOUNTER — Encounter: Payer: Medicare HMO | Admitting: Physical Therapy

## 2018-12-16 ENCOUNTER — Encounter: Payer: Medicare HMO | Admitting: Physical Therapy

## 2018-12-20 ENCOUNTER — Encounter: Payer: Medicare HMO | Admitting: Physical Therapy

## 2018-12-23 ENCOUNTER — Encounter: Payer: Medicare HMO | Admitting: Physical Therapy

## 2018-12-24 NOTE — Telephone Encounter (Signed)
Attempted to contact daughter x 2 to rescheduled cancelled appointment to virtual visit. Left message to call back.

## 2018-12-27 ENCOUNTER — Encounter: Payer: Medicare HMO | Admitting: Physical Therapy

## 2018-12-27 DIAGNOSIS — Z7901 Long term (current) use of anticoagulants: Secondary | ICD-10-CM | POA: Diagnosis not present

## 2018-12-28 ENCOUNTER — Telehealth: Payer: Self-pay | Admitting: *Deleted

## 2018-12-28 ENCOUNTER — Encounter: Payer: Medicare HMO | Admitting: Physical Therapy

## 2018-12-28 NOTE — Telephone Encounter (Signed)
A message was left to call to reschedule appointment.

## 2018-12-29 DIAGNOSIS — R269 Unspecified abnormalities of gait and mobility: Secondary | ICD-10-CM | POA: Diagnosis not present

## 2018-12-29 DIAGNOSIS — I5022 Chronic systolic (congestive) heart failure: Secondary | ICD-10-CM | POA: Diagnosis not present

## 2018-12-29 DIAGNOSIS — Z86711 Personal history of pulmonary embolism: Secondary | ICD-10-CM | POA: Diagnosis not present

## 2018-12-30 ENCOUNTER — Encounter: Payer: Medicare HMO | Admitting: Physical Therapy

## 2019-01-03 ENCOUNTER — Encounter: Payer: Medicare HMO | Admitting: Physical Therapy

## 2019-01-06 ENCOUNTER — Encounter: Payer: Medicare HMO | Admitting: Physical Therapy

## 2019-01-10 DIAGNOSIS — E0590 Thyrotoxicosis, unspecified without thyrotoxic crisis or storm: Secondary | ICD-10-CM | POA: Diagnosis not present

## 2019-01-10 DIAGNOSIS — Z7901 Long term (current) use of anticoagulants: Secondary | ICD-10-CM | POA: Diagnosis not present

## 2019-02-10 DIAGNOSIS — S81802A Unspecified open wound, left lower leg, initial encounter: Secondary | ICD-10-CM | POA: Diagnosis not present

## 2019-02-10 DIAGNOSIS — I872 Venous insufficiency (chronic) (peripheral): Secondary | ICD-10-CM | POA: Diagnosis not present

## 2019-02-10 DIAGNOSIS — Z7901 Long term (current) use of anticoagulants: Secondary | ICD-10-CM | POA: Diagnosis not present

## 2019-02-10 DIAGNOSIS — R600 Localized edema: Secondary | ICD-10-CM | POA: Diagnosis not present

## 2019-02-15 DIAGNOSIS — Z7901 Long term (current) use of anticoagulants: Secondary | ICD-10-CM | POA: Diagnosis not present

## 2019-02-16 ENCOUNTER — Other Ambulatory Visit (INDEPENDENT_AMBULATORY_CARE_PROVIDER_SITE_OTHER): Payer: Self-pay | Admitting: Cardiovascular Disease

## 2019-02-16 MED ORDER — METOPROLOL TARTRATE 50 MG TABLET
50.00 mg | ORAL_TABLET | Freq: Two times a day (BID) | ORAL | 0 refills | Status: AC
Start: 2019-02-16 — End: ?

## 2019-02-22 ENCOUNTER — Telehealth (INDEPENDENT_AMBULATORY_CARE_PROVIDER_SITE_OTHER): Payer: Medicare HMO | Admitting: Cardiology

## 2019-02-22 ENCOUNTER — Encounter: Payer: Self-pay | Admitting: Cardiology

## 2019-02-22 DIAGNOSIS — I272 Pulmonary hypertension, unspecified: Secondary | ICD-10-CM

## 2019-02-22 DIAGNOSIS — R600 Localized edema: Secondary | ICD-10-CM

## 2019-02-22 DIAGNOSIS — I4821 Permanent atrial fibrillation: Secondary | ICD-10-CM | POA: Diagnosis not present

## 2019-02-22 DIAGNOSIS — I5033 Acute on chronic diastolic (congestive) heart failure: Secondary | ICD-10-CM | POA: Diagnosis not present

## 2019-02-22 DIAGNOSIS — Z7189 Other specified counseling: Secondary | ICD-10-CM

## 2019-02-22 DIAGNOSIS — Z7901 Long term (current) use of anticoagulants: Secondary | ICD-10-CM | POA: Diagnosis not present

## 2019-02-22 DIAGNOSIS — I10 Essential (primary) hypertension: Secondary | ICD-10-CM

## 2019-02-22 DIAGNOSIS — R0602 Shortness of breath: Secondary | ICD-10-CM

## 2019-02-22 NOTE — Patient Instructions (Addendum)
Medication Instructions:  Your Physician recommend you continue on your current medication as directed.    If you need a refill on your cardiac medications before your next appointment, please call your pharmacy.   Lab work: None   Testing/Procedures: None  Follow-Up: Your physician recommends a follow up appointment on 03/02/19 at 2:15 pm with Kerin Ransom, PA  Do the following things EVERY DAY:  1) Weigh yourself EVERY morning after you go to the bathroom but before you eat or drink anything. Write this number down in a weight log/diary. If you gain 3 pounds overnight or 5 pounds in a week, call the office.  2) Take your medicines as prescribed. If you have concerns about your medications, please call us before you stop taking them.   3) Eat low salt foods-Limit salt (sodium) to 2000 mg per day. This will help prevent your body from holding onto fluid. Read food labels as many processed foods have a lot of sodium, especially canned goods and prepackaged meats. If you would like some assistance choosing low sodium foods, we would be happy to set you up with a nutritionist.  4) Stay as active as you can everyday. Staying active will give you more energy and make your muscles stronger. Start with 5 minutes at a time and work your way up to 30 minutes a day. Break up your activities--do some in the morning and some in the afternoon. Start with 3 days per week and work your way up to 5 days as you can.  If you have chest pain, feel short of breath, dizzy, or lightheaded, STOP. If you don't feel better after a short rest, call 911. If you do feel better, call the office to let us know you have symptoms with exercise.  5) Limit all fluids for the day to less than 2 liters. Fluid includes all drinks, coffee, juice, ice chips, soup, jello, and all other liquids.

## 2019-02-22 NOTE — Progress Notes (Signed)
Virtual Visit via Telephone Note   This visit type was conducted due to national recommendations for restrictions regarding the COVID-19 Pandemic (e.g. social distancing) in an effort to limit this patient's exposure and mitigate transmission in our community.  Due to her co-morbid illnesses, this patient is at least at moderate risk for complications without adequate follow up.  This format is felt to be most appropriate for this patient at this time.  The patient did not have access to video technology/had technical difficulties with video requiring transitioning to audio format only (telephone).  All issues noted in this document were discussed and addressed.  No physical exam could be performed with this format.  Please refer to the patient's chart for her  consent to telehealth for [facility].   Date:  02/22/2019   ID:  [name], DOB [birth year], MRN [id number]  Patient Location: Home Provider Location: Home  PCP:  Maurice Small, MD  Cardiologist:  Buford Dresser, MD  Electrophysiologist:  None   Evaluation Performed:  New Patient Evaluation  Chief Complaint:  Weeping bilateral edema  History of Present Illness:    [name] is a 85 y.o. female with PMH atrial fibrillation on coumadin, essential hypertension, pulmonary hypertension, and chronic diastolic heart failure  The patient does not have symptoms concerning for COVID-19 infection (fever, chills, cough, or new shortness of breath).   Telephone visit with patient and her daughter [name] today. The only records I have access to are from Dr. Tish Men at [facility], and these are scanned under the media tab. I reviewed 34 pages of notes prior to today's visit.  Had a well visit with Dr. Justin Mend yesterday. Starting around 6/4, they began noticing progressive LE edema. Legs are currently very swollen, have had legs wrapped and re-wrapped several times. There are areas of bleeding/weeping. Tries to use support  hose, but has had several days where they weren't used (daughter was traveling). Tries to elevate but legs tend to hang down during the day. Yesterday was 220 lbs (per prior notes in system, dry weight had been 185 lbs--they are not sure of current dry weight). Watching salt, elevating feet. Bought a lift chair to try to keep feet up.   Pt had been with daughter since 07/2018, has had issues with the legs leaking several times in the last several months. Has been told it is due to CHF. We discussed this definition at length today. I reviewed findings of pulmonary hypertension on echo. They do not remember discussing this (does appear that TVR was discussed at some point per notes). We reviewed different types of heart failure, difference causes of pHTN, and management strategies at length.   No PND, does have 3 pillows but moves them at night per patient; daughter says that even lying back in the reclining chair yesterday made her uncomfortable.  Taking bumex once daily currently (over the last week), had taken every other day before that. Hasn't wanted to take BID as it wears her out, makes her use the bathroom. Get very fatigued, worn out with even minimal exertion.  Significant shortness of breath intermittently. Is currently in atrial fibrillation per ECG in Dr. Jason Nest office yesterday.  Past Medical History:  Diagnosis Date  . Anticoagulant long-term use   . Arrhythmia   . Arthritis   . Atrial fibrillation ([place])   . Congestive heart failure (CHF) ([place])   . Dysphagia   . Esophageal reflux   . Essential hypertension   .  Hiatal hernia   . Pulmonary embolus ([facility])   . Thyroid disease    Past Surgical History:  Procedure Laterality Date  . COLONOSCOPY    . ELBOW SURGERY    . LAPAROSCOPIC HYSTERECTOMY       Current Meds  Medication Sig  . bumetanide (BUMEX) 1 MG tablet Take 1 mg by mouth daily.  . cholecalciferol (VITAMIN D3) 25 MCG (1000 UT) tablet Take 1,000 Units by mouth daily.  [name] Kitchen.  gabapentin (NEURONTIN) 300 MG capsule Take 300 mg by mouth 2 (two) times daily.  [name] Kitchen. levothyroxine (SYNTHROID, LEVOTHROID) 100 MCG tablet Take 100 mcg by mouth daily before breakfast.  . losartan (COZAAR) 100 MG tablet Take 100 mg by mouth daily.  . metoprolol tartrate (LOPRESSOR) 50 MG tablet Take 50 mg by mouth 2 (two) times daily.  . Multiple Vitamins-Minerals (PRESERVISION/LUTEIN) CAPS Take by mouth daily.  . nortriptyline (PAMELOR) 10 MG capsule Take 10 mg by mouth at bedtime.  . pantoprazole (PROTONIX) 40 MG tablet Take 40 mg by mouth daily.  [name] Kitchen. spironolactone (ALDACTONE) 50 MG tablet Take 50 mg by mouth. Every morning with breakfast.  . warfarin (COUMADIN) 1 MG tablet Take 1 mg by mouth one time only at 6 PM. 2 tablets on Wednesday 1 tablet every other day     Allergies:   Acetaminophen, Amoxicillin, Codeine, Erythromycin, Iodine, Oxycodone, Oxycodone-acetaminophen, Penicillins, Sulfasalazine, and Tramadol   Social History   Tobacco Use  . Smoking status: Former Smoker    Packs/day: 1.00    Types: Cigarettes    Quit date: 10/02/2001    Years since quitting: 17.4  . Smokeless tobacco: Never Used  . Tobacco comment: former smoker  Substance Use Topics  . Alcohol use: Not on file  . Drug use: Not on file     Family Hx: The patient's family history includes Cancer in her brother; Stroke in her mother.  ROS:   Please see the history of present illness.    Constitutional: Negative for chills, fever, night sweats, unintentional weight loss  HENT: Negative for ear pain and hearing loss.   Eyes: Negative for loss of vision and eye pain.  Respiratory: Negative for cough, sputum, wheezing.   Cardiovascular: See HPI. Gastrointestinal: Negative for abdominal pain, melena, and hematochezia.  Genitourinary: Negative for dysuria and hematuria.  Musculoskeletal: Negative for falls and myalgias.  Skin: Negative for itching and rash.  Neurological: Negative for focal weakness, focal sensory  changes and loss of consciousness. Has baseline difficulty with ambulation, uses wheelchair frequently Endo/Heme/Allergies: Does bruise/bleed easily.  All other systems reviewed and are negative.  Prior CV studies:   The following studies were reviewed today: Per notes: echo 2014, lexiscan 2015 low risk, echo 2019 hyperdynamic EF, septal flattening, severe RV dilation, RVSP noted as critical (near systemic) pulmonary hypertension, severe biatrial enlargement, dilated coronary sinus ?persistent left SVC. Venous study 2019 with bilateral mild insufficiency  Echo 11/10/17 with hyperdynamic EF, septal flattening, severe RV dilation, severe TR, severe pulmonary hypertension (listed at 71 mmHg in note)  Labs/Other Tests and Data Reviewed:    EKG:  Scanned ECGs in system difficult to read due to quality, but 05/05/18 (most recent) appears to be atrial fibrillation  Recent Labs: No results found for requested labs within last 8760 hours. Limited labs available in [facility].  Recent Lipid Panel No results found for: CHOL, TRIG, HDL, CHOLHDL, LDLCALC, LDLDIRECT  Wt Readings from Last 3 Encounters:  No data found for Wt     Objective:  Vital Signs:  There were no vitals taken for this visit. No vitals available.  Majority of history per daughter, though patient able to answer questions and provide fair history. In no respiratory distress on the phone.  ASSESSMENT & PLAN:    Severe peripheral edema with weeping: difficult to assess via virtual visit today. Only able to do phone (not video) visit. Needs to be seen in person. Suspect this is acute on chronic diastolic heart failure and severe pulmonary hypertension -I discussed at length the difference between right and left sided heart failure. No mention of diastolic dysfunction (likely due to afib), but hyperdynamic LV function.  -reviewed that very elevated RV pressures can occur for a variety of reasons, but the most likely management strategy will  be to avoid hypervolemia.  -note sent states that workup for this had been considered (including RHC, VQ, serologies, etc) as well as TVR. Note states that pt had not been interested in TVR, which suggests that pHTN had been addressed previously, though patient and daughter cannot recall a prior discussion. -notes state that she had lightheadedness on metolazone, and she was changed from lasix to bumex -reported dry weight per notes around 185 lbs -gave heart failure education, included on AVS below -will have them see in office next week with Corine ShelterLuke Kilroy for in person evaluation of LE edema -may need to repeat echo, though I suspect it will again show severe PH. However, if EF has dropped may change management -currently on bumex 1 mg daily. Daughter and patient somewhat disagree on whether she is taking this daily or twice daily (recommended for twice daily per report). They will bring all meds to visit -on spironolactone as well.  -Recommend checking BMET at office visit (per [facility], Cr 1.14 11/19/18, K 4.8 at that time) -may benefit from home visits for monitoring  Hypertension: on scanned note, systolic BP was 190 at one time, though better controlled on other visits. Mention is also made of LVH, though I cannot see how much. This supports diastolic dysfunction as possibly component -check BP in office -on losartan, bunex, metoprolol, spironolactone  Atrial fibrillation, permanent -CHA2DS2/VAS=at least 5 -on coumadin, managed per PCP. Last INR 2.4 02/21/19 -on metoprolol for rate control   COVID-19 Education: The signs and symptoms of COVID-19 were discussed with the patient and how to seek care for testing (follow up with PCP or arrange E-visit).  The importance of social distancing was discussed today.  Time:   Today, I have spent 47 minutes with the patient with telehealth technology discussing the above problems, including medication reconciliation time.    I also spent >45 minutes  non-face to face time reviewing 54 pages of records sent from Dr. Bernell ListPritt's office  Documentation of Prolonged Non Face-To-Face Time (> 31 minutes) I personally reviewed prior medical records (both internal and external) for this encounter.  This included review of historical hospital records, office notes, cardiac studies (e.g. Echocardiograms, Stress Tests, Cardiac Catheterizations, Event Monitors, etc), radiologic studies (e.g. MRIs, CTs, Xrays, etc), laboratory data, etc.  The pertinent findings are outlined in my note.  The total non face to face time spent for record review was >45 minutes.  This does not include the review of my own personal notes and records.  Patient Instructions  Medication Instructions:  Your Physician recommend you continue on your current medication as directed.    If you need a refill on your cardiac medications before your next appointment, please call your pharmacy.  Lab work: None   Testing/Procedures: None  Follow-Up: Your physician recommends a follow up appointment on 03/02/19 at 2:15 pm with Corine ShelterLuke Kilroy, PA  Do the following things EVERY DAY:  1) Weigh yourself EVERY morning after you go to the bathroom but before you eat or drink anything. Write this number down in a weight log/diary. If you gain 3 pounds overnight or 5 pounds in a week, call the office.  2) Take your medicines as prescribed. If you have concerns about your medications, please call us before you stop taking them.   3) Eat low salt foods-Limit salt (sodium) to 2000 mg per day. This will help prevent your body from holding onto fluid. Read food labels as many processed foods have a lot of sodium, especially canned goods and prepackaged meats. If you would like some assistance choosing low sodium foods, we would be happy to set you up with a nutritionist.  4) Stay as active as you can everyday. Staying active will give you more energy and make your muscles stronger. Start with 5 minutes  at a time and work your way up to 30 minutes a day. Break up your activities--do some in the morning and some in the afternoon. Start with 3 days per week and work your way up to 5 days as you can.  If you have chest pain, feel short of breath, dizzy, or lightheaded, STOP. If you don't feel better after a short rest, call 911. If you do feel better, call the office to let us know you have symptoms with exercise.  5) Limit all fluids for the day to less than 2 liters. Fluid includes all drinks, coffee, juice, ice chips, soup, jello, and all other liquids.    Medication Adjustments/Labs and Tests Ordered: Current medicines are reviewed at length with the patient today.  Concerns regarding medicines are outlined above.   Follow Up:  Next week in office for ASAP visit  Signed, Jodelle RedBridgette Baltazar Pekala, MD  02/22/2019 9:48 PM    [facility]

## 2019-02-24 ENCOUNTER — Telehealth: Payer: Self-pay

## 2019-02-24 NOTE — Telephone Encounter (Signed)
Left message for patient to contact office to answer covid-19 screening questions prior to appt.

## 2019-02-25 NOTE — Telephone Encounter (Signed)
Spoke with patients daughter, Phyllis,per dpr, and asked covid-19 questions. Informed [name] that she would not be able to come to the office because we are not allowing vistor's in the office at this time. She stated that her mom has memory issues and she would like to speak with the provider about her mom's health. Informed her that her mom  Meets exception standards and she will be able to come to the office with her mom. She voiced understanding.           COVID-19 Pre-Screening Questions:  . In the past 7 to 10 days have you had a cough,  shortness of breath, headache, congestion, fever (100 or greater) body aches, chills, sore throat, or sudden loss of taste or sense of smell? NO . Have you been around anyone with known Covid 19. NO . Have you been around anyone who is awaiting Covid 19 test results in the past 7 to 10 days? NO . Have you been around anyone who has been exposed to Covid 19, or has mentioned symptoms of Covid 19 within the past 7 to 10 days? NO  If you have any concerns/questions about symptoms patients report during screening (either on the phone or at threshold). Contact the provider seeing the patient or DOD for further guidance.  If neither are available contact a member of the leadership team.

## 2019-03-01 ENCOUNTER — Telehealth: Payer: Self-pay | Admitting: Cardiology

## 2019-03-01 NOTE — Telephone Encounter (Signed)
LVM for patient to confirm appt on 03-02-19 °

## 2019-03-02 ENCOUNTER — Encounter: Payer: Self-pay | Admitting: General Practice

## 2019-03-02 ENCOUNTER — Ambulatory Visit (INDEPENDENT_AMBULATORY_CARE_PROVIDER_SITE_OTHER): Payer: Medicare HMO | Admitting: General Practice

## 2019-03-02 ENCOUNTER — Other Ambulatory Visit: Payer: Self-pay

## 2019-03-02 VITALS — BP 126/62 | HR 65 | Temp 97.9°F | Ht 63.0 in | Wt 227.2 lb

## 2019-03-02 DIAGNOSIS — I10 Essential (primary) hypertension: Secondary | ICD-10-CM

## 2019-03-02 DIAGNOSIS — I4821 Permanent atrial fibrillation: Secondary | ICD-10-CM

## 2019-03-02 DIAGNOSIS — Z7189 Other specified counseling: Secondary | ICD-10-CM

## 2019-03-02 DIAGNOSIS — R600 Localized edema: Secondary | ICD-10-CM

## 2019-03-02 DIAGNOSIS — Z7901 Long term (current) use of anticoagulants: Secondary | ICD-10-CM

## 2019-03-02 DIAGNOSIS — R0602 Shortness of breath: Secondary | ICD-10-CM

## 2019-03-02 DIAGNOSIS — I5033 Acute on chronic diastolic (congestive) heart failure: Secondary | ICD-10-CM | POA: Diagnosis not present

## 2019-03-02 MED ORDER — BUMETANIDE 1 MG PO TABS: 1.0000 mg | ORAL_TABLET | Freq: Two times a day (BID) | ORAL | 2 refills | Status: DC

## 2019-03-02 MED ORDER — SPIRONOLACTONE 25 MG PO TABS: 25.0000 mg | ORAL_TABLET | Freq: Every day | ORAL | 1 refills | Status: DC

## 2019-03-02 NOTE — Progress Notes (Signed)
Cardiology Clinic Note   Patient Name: [name] Date of Encounter: 03/02/2019  Primary Care Provider:  Shirlean MylarWebb, Carol, MD Primary Cardiologist:  Jodelle RedBridgette Christopher, MD  Patient Profile    [name] is an 85 year old female presents today for follow-up evaluation of her lower extremity edema . Past Medical History    Past Medical History:  Diagnosis Date  . Anticoagulant long-term use   . Arrhythmia   . Arthritis   . Atrial fibrillation ([facility])   . Congestive heart failure (CHF) ([facility])   . Dysphagia   . Esophageal reflux   . Essential hypertension   . Hiatal hernia   . Pulmonary embolus ([facility])   . Thyroid disease    Past Surgical History:  Procedure Laterality Date  . COLONOSCOPY    . ELBOW SURGERY    . LAPAROSCOPIC HYSTERECTOMY      Allergies  Allergies  Allergen Reactions  . Acetaminophen   . Amoxicillin   . Codeine   . Erythromycin   . Iodine   . Oxycodone   . Oxycodone-Acetaminophen   . Penicillins   . Sulfasalazine   . Tramadol     History of Present Illness    [name] was last seen by Cristal Deerhristopher virtually on 02/22/2019.  During that time it was noted that she had bilateral weeping edema.  [place] she was followed by Dr. Orvis [facility]illPlitt [facility].  Her previous records can be located under the media tab.  She is also established with a PCP, Dr. Shirlean Mylararol Webb.  According to the last note her dry weight is somewhere around 185 pounds during the last visit she weighed around 220 pounds.  She also noted that she was watching her salt elevating her feet and recently bought a lift chair to try to keep her feet up.  She also recently increased her Bumex from  every other day to daily.   She has a PMH of atrial fibrillation rate controlled and on Coumadin (goal INR 2-3), essential hypertension, pulmonary hypertension, and chronic diastolic heart failure.  She presents today with her daughter.  Her lower  extremities are significantly swollen.  She has pitting edema up to her thighs and crackles are apparent during auscultation.  Her weight today is 227 pounds.  Her daughter states that she is only able to take steps before needing to sit down to rest.  She has been sleeping with 4 pillows behind her each night.  She states that she frequently does not take her Bumex medication when she is planning to go out for the day.  She is also stopped taking her spironolactone.  Patient also states that she frequently enjoys processed foods, snack foods, and eats fast food ([employer]).   On further assessment there are no open or weeping wounds on her lower extremities.  When given the option to be admitted to the hospital and receive IV diuresis she stated that she would not go to the hospital.  She was agreeable to intensive outpatient diuresis.  She was educated about fluid volume overload and encouraged to call EMS if she develops severe shortness of breath at rest.  I have ordered a lift chair and should arrive today.  She was also at her primary care doctors on Monday for a annual physical exam, and blood work was drawn.  We will request these labs.  She denies chest pain, melena, hematuria, hemoptysis, syncope, and PND.  Patient was  educated about medication compliance, heart failure, the importance of a low-sodium diet, and healthy food choices.  Home Medications    Prior to Admission medications   Medication Sig Start Date End Date Taking? Authorizing Provider  bumetanide (BUMEX) 1 MG tablet Take 1 mg by mouth daily.    [provider]  cholecalciferol (VITAMIN D3) 25 MCG (1000 UT) tablet Take 1,000 Units by mouth daily.    [provider]  gabapentin (NEURONTIN) 300 MG capsule Take 300 mg by mouth 2 (two) times daily.    [provider]  levothyroxine (SYNTHROID, LEVOTHROID) 100 MCG tablet Take 100 mcg by mouth daily before breakfast.    [provider]  losartan  (COZAAR) 100 MG tablet Take 100 mg by mouth daily.    [provider]  metoprolol tartrate (LOPRESSOR) 50 MG tablet Take 50 mg by mouth 2 (two) times daily.    [provider]  Multiple Vitamins-Minerals (PRESERVISION/LUTEIN) CAPS Take by mouth daily.    [provider]  nortriptyline (PAMELOR) 10 MG capsule Take 10 mg by mouth at bedtime.    [provider]  pantoprazole (PROTONIX) 40 MG tablet Take 40 mg by mouth daily.    [provider]  spironolactone (ALDACTONE) 50 MG tablet Take 50 mg by mouth. Every morning with breakfast.    [provider]  warfarin (COUMADIN) 1 MG tablet Take 1 mg by mouth one time only at 6 PM. 2 tablets on Wednesday 1 tablet every other day    [provider]    Family History    Family History  Problem Relation Age of Onset  . Stroke Mother   . Cancer Brother    She indicated that the status of her mother is unknown. She indicated that the status of her brother is unknown.  Social History    Social History   Socioeconomic History  . Marital status: Widowed    Spouse name: Not on file  . Number of children: Not on file  . Years of education: Not on file  . Highest education level: Not on file  Occupational History  . Not on file  Social Needs  . Financial resource strain: Not on file  . Food insecurity    Worry: Not on file    Inability: Not on file  . Transportation needs    Medical: Not on file    Non-medical: Not on file  Tobacco Use  . Smoking status: Former Smoker    Packs/day: 1.00    Types: Cigarettes    Quit date: 10/02/2001    Years since quitting: 17.4  . Smokeless tobacco: Never Used  . Tobacco comment: former smoker  Substance and Sexual Activity  . Alcohol use: Not on file  . Drug use: Not on file  . Sexual activity: Not on file  Lifestyle  . Physical activity    Days per week: Not on file    Minutes per session: Not on file  . Stress: Not on file   Relationships  . Social Musicianconnections    Talks on phone: Not on file    Gets together: Not on file    Attends religious service: Not on file    Active member of club or organization: Not on file    Attends meetings of clubs or organizations: Not on file    Relationship status: Not on file  . Intimate partner violence    Fear of current or ex partner: Not on file  Emotionally abused: Not on file    Physically abused: Not on file    Forced sexual activity: Not on file  Other Topics Concern  . Not on file  Social History Narrative  . Not on file     Review of Systems    General:  No chills, fever, night sweats or weight changes.  Cardiovascular:  No chest pain, +++dyspnea on exertion,+++ edema bilateral lower extremities, +++orthopnea, palpitations, paroxysmal nocturnal dyspnea. Dermatological: No rash, lesions/masses Respiratory: No cough, dyspnea Urologic: No hematuria, dysuria Abdominal:   No nausea, vomiting, diarrhea, bright red blood per rectum, melena, or hematemesis Neurologic:  No visual changes, wkns, changes in mental status. All other systems reviewed and are otherwise negative except as noted above.  Physical Exam    VS:  BP 126/62   Pulse 65   Temp 97.9 F (36.6 C)   Ht 5\' 3"  (1.6 m)   Wt 227 lb 3.2 oz (103.1 kg)   SpO2 92%   BMI 40.25 kg/m  , BMI Body mass index is 40.25 kg/m. GEN: Well nourished, well developed, in no acute distress. HEENT: normal. Neck: Supple, ++ JVD, carotid bruits, or masses. Cardiac: irregularly irregular, no murmurs, rubs, or gallops. No clubbing, cyanosis, edema.  Radials/DP/PT 2+ and equal bilaterally.  Respiratory:  Respirations regular and unlabored, ++crackles bilateral bases. GI: Soft, nontender, nondistended, BS + x 4. MS: no deformity or atrophy. Skin: warm and dry, no rash. Neuro:  Strength and sensation are intact. Psych: Normal affect.  Accessory Clinical Findings    ECG personally reviewed by me today-atrial  fibrillation no acute changes 65 bpm-chronic  Assessment & Plan   1.  Acute on chronic diastolic heart failure- bilateral pitting edema up to thighs and crackles bilateral lung bases Increase Bumex 2 mg in the a.m. and 1 mg in the afternoon x3 days then, 1 mg in the a.m. and 1 mg in the afternoon. Close follow up. Restart Spironolactone 25 mg tablet daily Obtain CMP CBC from Dr. Justin Mend Follow-up lab work in 1 week 2.  Bilateral lower extremity edema/heart failure education- bilateral pitting edema, severe RV dilation, severe TR  Wear support stockings Increase physical activity as tolerated low-sodium diet 3.  Permanent atrial fibrillation-INR 2.3 (goal 2-3) Continue warfarin Continue INR checks 4.  Essential hypertension- well-controlled Continue losartan 100 mg tablet daily Continue Toprol tartrate 50 mg tablet twice daily  Patient educated about when to call EMS.  She was instructed that if she has severe shortness of breath at rest she needs to call EMS.  Patient agrees to this plan and verifies new medication regimen.  Disposition: Follow-up with Dr. Harrell Gave in 1 to 2 weeks   Deberah Pelton, NP 03/02/2019, 4:46 PM

## 2019-03-02 NOTE — Patient Instructions (Addendum)
Medication Instructions:  START Bumex Take 2mg  in the morning and 1mg  in the afternoon for 3 days then Take 1mg  twice daily (morning and afternoon)  DECREASE  Spironolactone to 25mg  OR you can take half tablet  Of 50mg  once a day  If you need a refill on your cardiac medications before your next appointment, please call your pharmacy.   Lab work: None-will request labs from Dr [employer] office If you have labs (blood work) drawn today and your tests are completely normal, you will receive your results only by: [name] Kitchen MyChart Message (if you have MyChart) OR . A paper copy in the mail If you have any lab test that is abnormal or we need to change your treatment, we will call you to review the results.  Testing/Procedures: None   Follow-Up: At [facility], you and your health needs are our priority.  As part of our continuing mission to provide you with exceptional heart care, we have created designated Provider Care Teams.  These Care Teams include your primary Cardiologist (physician) and Advanced Practice Providers (APPs -  Physician Assistants and Nurse Practitioners) who all work together to provide you with the care you need, when you need it. You will need a follow up appointment in 1 weeks.You may see Buford Dresser, MD or one of the following Advanced Practice Providers on your designated Care Team:   Rosaria Ferries, PA-C . Jory Sims, DNP, ANP  Any Other Special Instructions Will Be Listed Below (If Applicable). 1. CHECK YOUR WEIGHT AT THE SAME TIME EVERY DAY 2. Low sodium diet 3. Elevate her legs  4. Support hose 5. Increase activity as tolerated    Low-Sodium Eating Plan Sodium, which is an element that makes up salt, helps you maintain a healthy balance of fluids in your body. Too much sodium can increase your blood pressure and cause fluid and waste to be held in your body. Your health care provider or dietitian may recommend following this plan if you have high  blood pressure (hypertension), kidney disease, liver disease, or heart failure. Eating less sodium can help lower your blood pressure, reduce swelling, and protect your heart, liver, and kidneys. What are tips for following this plan? General guidelines  Most people on this plan should limit their sodium intake to 1,500-2,000 mg (milligrams) of sodium each day. Reading food labels   The Nutrition Facts label lists the amount of sodium in one serving of the food. If you eat more than one serving, you must multiply the listed amount of sodium by the number of servings.  Choose foods with less than 140 mg of sodium per serving.  Avoid foods with 300 mg of sodium or more per serving. Shopping  Look for lower-sodium products, often labeled as "low-sodium" or "no salt added."  Always check the sodium content even if foods are labeled as "unsalted" or "no salt added".  Buy fresh foods. ? Avoid canned foods and premade or frozen meals. ? Avoid canned, cured, or processed meats  Buy breads that have less than 80 mg of sodium per slice. Cooking  Eat more home-cooked food and less restaurant, buffet, and fast food.  Avoid adding salt when cooking. Use salt-free seasonings or herbs instead of table salt or sea salt. Check with your health care provider or pharmacist before using salt substitutes.  Cook with plant-based oils, such as canola, sunflower, or olive oil. Meal planning  When eating at a restaurant, ask that your food be prepared with less  salt or no salt, if possible.  Avoid foods that contain MSG (monosodium glutamate). MSG is sometimes added to Cong[place]inese food, bouillon, and some canned foods. What foods are recommended? The items listed may not be a complete list. Talk with your dietitian about what dietary choices are best for you. Grains Low-sodium cereals, including oats, puffed wheat and rice, and shredded wheat. Low-sodium crackers. Unsalted rice. Unsalted pasta. Low-sodium  bread. Whole-grain breads and whole-grain pasta. Vegetables Fresh or frozen vegetables. "No salt added" canned vegetables. "No salt added" tomato sauce and paste. Low-sodium or reduced-sodium tomato and vegetable juice. Fruits Fresh, frozen, or canned fruit. Fruit juice. Meats and other protein foods Fresh or frozen (no salt added) meat, poultry, seafood, and fish. Low-sodium canned tuna and salmon. Unsalted nuts. Dried peas, beans, and lentils without added salt. Unsalted canned beans. Eggs. Unsalted nut butters. Dairy Milk. Soy milk. Cheese that is naturally low in sodium, such as ricotta cheese, fresh mozzarella, or Swiss cheese Low-sodium or reduced-sodium cheese. Cream cheese. Yogurt. Fats and oils Unsalted butter. Unsalted margarine with no trans fat. Vegetable oils such as canola or olive oils. Seasonings and other foods Fresh and dried herbs and spices. Salt-free seasonings. Low-sodium mustard and ketchup. Sodium-free salad dressing. Sodium-free light mayonnaise. Fresh or refrigerated horseradish. Lemon juice. Vinegar. Homemade, reduced-sodium, or low-sodium soups. Unsalted popcorn and pretzels. Low-salt or salt-free chips. What foods are not recommended? The items listed may not be a complete list. Talk with your dietitian about what dietary choices are best for you. Grains Instant hot cereals. Bread stuffing, pancake, and biscuit mixes. Croutons. Seasoned rice or pasta mixes. Noodle soup cups. Boxed or frozen macaroni and cheese. Regular salted crackers. Self-rising flour. Vegetables Sauerkraut, pickled vegetables, and relishes. Olives. [place] fries. Onion rings. Regular canned vegetables (not low-sodium or reduced-sodium). Regular canned tomato sauce and paste (not low-sodium or reduced-sodium). Regular tomato and vegetable juice (not low-sodium or reduced-sodium). Frozen vegetables in sauces. Meats and other protein foods Meat or fish that is salted, canned, smoked, spiced, or  pickled. Bacon, ham, sausage, hotdogs, corned beef, chipped beef, packaged lunch meats, salt pork, jerky, pickled herring, anchovies, regular canned tuna, sardines, salted nuts. Dairy Processed cheese and cheese spreads. Cheese curds. Blue cheese. Feta cheese. String cheese. Regular cottage cheese. Buttermilk. Canned milk. Fats and oils Salted butter. Regular margarine. Ghee. Bacon fat. Seasonings and other foods Onion salt, garlic salt, seasoned salt, table salt, and sea salt. Canned and packaged gravies. Worcestershire sauce. Tartar sauce. Barbecue sauce. Teriyaki sauce. Soy sauce, including reduced-sodium. Steak sauce. Fish sauce. Oyster sauce. Cocktail sauce. Horseradish that you find on the shelf. Regular ketchup and mustard. Meat flavorings and tenderizers. Bouillon cubes. Hot sauce and Tabasco sauce. Premade or packaged marinades. Premade or packaged taco seasonings. Relishes. Regular salad dressings. Salsa. Potato and tortilla chips. Corn chips and puffs. Salted popcorn and pretzels. Canned or dried soups. Pizza. Frozen entrees and pot pies. Summary  Eating less sodium can help lower your blood pressure, reduce swelling, and protect your heart, liver, and kidneys.  Most people on this plan should limit their sodium intake to 1,500-2,000 mg (milligrams) of sodium each day.  Canned, boxed, and frozen foods are high in sodium. Restaurant foods, fast foods, and pizza are also very high in sodium. You also get sodium by adding salt to food.  Try to cook at home, eat more fresh fruits and vegetables, and eat less fast food, canned, processed, or prepared foods. This information is not intended to replace advice given  to you by your health care provider. Make sure you discuss any questions you have with your health care provider. Document Released: 02/14/2002 Document Revised: 08/18/2016 Document Reviewed: 08/18/2016 Elsevier Interactive Patient Education  2019 [employer]

## 2019-03-09 ENCOUNTER — Ambulatory Visit: Payer: Medicare HMO | Admitting: Cardiology

## 2019-03-15 ENCOUNTER — Ambulatory Visit (INDEPENDENT_AMBULATORY_CARE_PROVIDER_SITE_OTHER): Payer: Medicare HMO | Admitting: Cardiology

## 2019-03-15 ENCOUNTER — Encounter: Payer: Self-pay | Admitting: Cardiology

## 2019-03-15 ENCOUNTER — Other Ambulatory Visit: Payer: Self-pay

## 2019-03-15 VITALS — Ht 63.0 in | Wt 207.0 lb

## 2019-03-15 DIAGNOSIS — I4821 Permanent atrial fibrillation: Secondary | ICD-10-CM

## 2019-03-15 DIAGNOSIS — I5033 Acute on chronic diastolic (congestive) heart failure: Secondary | ICD-10-CM | POA: Diagnosis not present

## 2019-03-15 DIAGNOSIS — I272 Pulmonary hypertension, unspecified: Secondary | ICD-10-CM

## 2019-03-15 DIAGNOSIS — R600 Localized edema: Secondary | ICD-10-CM | POA: Diagnosis not present

## 2019-03-15 DIAGNOSIS — Z79899 Other long term (current) drug therapy: Secondary | ICD-10-CM | POA: Diagnosis not present

## 2019-03-15 DIAGNOSIS — Z7189 Other specified counseling: Secondary | ICD-10-CM

## 2019-03-15 NOTE — Patient Instructions (Addendum)
Medication Instructions:  Your Physician recommend you continue on your current medication as directed.    If you need a refill on your cardiac medications before your next appointment, please call your pharmacy.   Lab work: Your physician recommends that you return for lab work in 1 week (BMP)    Testing/Procedures: None  Follow-Up: At [facility], you and your health needs are our priority.  As part of our continuing mission to provide you with exceptional heart care, we have created designated Provider Care Teams.  These Care Teams include your primary Cardiologist (physician) and Advanced Practice Providers (APPs -  Physician Assistants and Nurse Practitioners) who all work together to provide you with the care you need, when you need it. You will need a follow up appointment in 1 weeks.  Please call our office 2 months in advance to schedule this appointment.  You may see Buford Dresser, MD or one of the following Advanced Practice Providers on your designated Care Team:   Rosaria Ferries, PA-C . Jory Sims, DNP, ANP

## 2019-03-15 NOTE — Progress Notes (Signed)
Cardiology Office Note:    Date:  03/15/2019   ID:  [name], DOB 14-Aug-1928, MRN [id number]  PCP:  Maurice Small, MD  Cardiologist:  Buford Dresser, MD PhD  Referring MD: Maurice Small, MD   Chief Complaint  Patient presents with  . Follow-up    History of Present Illness:    [name] is a 85 y.o. female with a hx of atrial fibrillation on coumadin, essential hypertension, pulmonary hypertension, and chronic diastolic heart failure. Her initial consult (telemedicine) was 11/25/18, seen most recently last week by Ned Clines.  Cardiac history: Previously followed by Dr. Tish Men at [facility] (notes under media tab). Per notes:  -echo 2014, lexiscan 2015 low risk,  -echo 2019 hyperdynamic EF, septal flattening, severe RV dilation, RVSP noted as critical (near systemic) pulmonary hypertension, severe biatrial enlargement, dilated coronary sinus ?persistent left SVC.  -Venous study 2019 with bilateral mild insufficiency -Echo 11/10/17 with hyperdynamic EF, septal flattening, severe RV dilation, severe TR, severe pulmonary hypertension (listed at 71 mmHg in note)  Today: Weights have been improving. Cooking more at home, watching salt. Today 204.6 lbs, peak 227.2 lbs on 03/02/19. Previously reported dry weight of 185 lbs.   We reviewed at length today the importance of salt avoidance, elevation, and compression. Patient's daughter [name] is very good at coordinating this, to patient's dismay. They would both like to avoid admission in the future for heart failure if possible, as she has had multiple admissions for this in the past.   Denies chest pain, shortness of breath at rest. No PND. Still with mild orthopnea, LE edema improving but still severe, worse today than usual. No unexpected weight gain. No syncope or palpitations.  Past Medical History:  Diagnosis Date  . Anticoagulant long-term use   . Arrhythmia   . Arthritis   . Atrial fibrillation ([place])   .  Congestive heart failure (CHF) ([place])   . Dysphagia   . Esophageal reflux   . Essential hypertension   . Hiatal hernia   . Pulmonary embolus ([place])   . Thyroid disease     Past Surgical History:  Procedure Laterality Date  . COLONOSCOPY    . ELBOW SURGERY    . LAPAROSCOPIC HYSTERECTOMY      Current Medications: Current Outpatient Medications on File Prior to Visit  Medication Sig  . bumetanide (BUMEX) 1 MG tablet Take 1 tablet (1 mg total) by mouth 2 (two) times daily. Take as directed  . cholecalciferol (VITAMIN D3) 25 MCG (1000 UT) tablet Take 1,000 Units by mouth 2 (two) times daily.   [name] Kitchen gabapentin (NEURONTIN) 300 MG capsule Take 600 mg by mouth 2 (two) times daily.   [name] Kitchen levothyroxine (SYNTHROID) 112 MCG tablet Take 112 mcg by mouth daily before breakfast.   . losartan (COZAAR) 100 MG tablet Take 100 mg by mouth daily.  . metoprolol tartrate (LOPRESSOR) 50 MG tablet Take 50 mg by mouth 2 (two) times daily.  . Multiple Vitamins-Minerals (PRESERVISION/LUTEIN) CAPS Take 1 capsule by mouth 2 (two) times daily.   . nortriptyline (PAMELOR) 10 MG capsule Take 10 mg by mouth at bedtime.  . pantoprazole (PROTONIX) 40 MG tablet Take 40 mg by mouth daily.  [name] Kitchen spironolactone (ALDACTONE) 25 MG tablet Take 1 tablet (25 mg total) by mouth daily. Every morning with breakfast.  . warfarin (COUMADIN) 1 MG tablet Take 1 mg by mouth one time only at 6 PM. 2 tablets on Monday, and Thursday and 1 tablet on  remaining days.   No current facility-administered medications on file prior to visit.      Allergies:   Acetaminophen, Amoxicillin, Codeine, Erythromycin, Iodine, Oxycodone, Oxycodone-acetaminophen, Penicillins, Sulfasalazine, and Tramadol   Social History   Socioeconomic History  . Marital status: Widowed    Spouse name: Not on file  . Number of children: Not on file  . Years of education: Not on file  . Highest education level: Not on file  Occupational History  . Not on file  Social  Needs  . Financial resource strain: Not on file  . Food insecurity    Worry: Not on file    Inability: Not on file  . Transportation needs    Medical: Not on file    Non-medical: Not on file  Tobacco Use  . Smoking status: Former Smoker    Packs/day: 1.00    Types: Cigarettes    Quit date: 10/02/2001    Years since quitting: 17.4  . Smokeless tobacco: Never Used  . Tobacco comment: former smoker  Substance and Sexual Activity  . Alcohol use: Not on file  . Drug use: Not on file  . Sexual activity: Not on file  Lifestyle  . Physical activity    Days per week: Not on file    Minutes per session: Not on file  . Stress: Not on file  Relationships  . Social Musicianconnections    Talks on phone: Not on file    Gets together: Not on file    Attends religious service: Not on file    Active member of club or organization: Not on file    Attends meetings of clubs or organizations: Not on file    Relationship status: Not on file  Other Topics Concern  . Not on file  Social History Narrative  . Not on file     Family History: The patient's family history includes Cancer in her brother; Stroke in her mother.  ROS:   Please see the history of present illness.  Additional pertinent ROS:  Constitutional: Negative for chills, fever, night sweats, unintentional weight loss. Does feel cold today, has blanket on, with long pants and long sleeves in July. HENT: Negative for ear pain and hearing loss.   Eyes: Negative for loss of vision and eye pain.  Respiratory: Negative for cough, sputum, shortness of breath, wheezing.   Cardiovascular: See HPI. Gastrointestinal: Negative for abdominal pain, melena, and hematochezia.  Genitourinary: Negative for dysuria and hematuria.  Musculoskeletal: Negative for falls and myalgias.  Skin: Negative for itching and rash.  Neurological: Negative for focal weakness, focal sensory changes and loss of consciousness.  Endo/Heme/Allergies: Does not  bruise/bleed easily.    EKGs/Labs/Other Studies Reviewed:    The following studies were reviewed today: See cardiac history, above  EKG:  EKG is personally reviewed.  The ekg ordered 03/02/19 demonstrates afib at 65 bpm  Recent Labs: No results found for requested labs within last 8760 hours.  Recent Lipid Panel No results found for: CHOL, TRIG, HDL, CHOLHDL, VLDL, LDLCALC, LDLDIRECT  Physical Exam:    VS:  Ht 5\' 3"  (1.6 m)   Wt 207 lb (93.9 kg)   BMI 36.67 kg/m     Wt Readings from Last 3 Encounters:  03/15/19 207 lb (93.9 kg)  03/02/19 227 lb 3.2 oz (103.1 kg)     GEN: frail, elderly appearing in no acute distress HEENT: Normal NECK: JVD elevated just above clavicle sitting upright; No carotid bruits LYMPHATICS: No lymphadenopathy CARDIAC:  irregularly irregular S1 and S2, no appreciated murmurs, rubs, gallops. +RV heave. Radial pulses 2+ bilaterally. DP pulses palpable through edema. RESPIRATORY:  Clear to auscultation without rales, wheezing or rhonchi  ABDOMEN: Soft, non-tender, non-distended MUSCULOSKELETAL:  Bilateral severe pitting LE edema to above knees, daughter reports worse than usual but improved from most swollen state  SKIN: Warm and dry NEUROLOGIC:  Alert and oriented x 3 PSYCHIATRIC:  Normal affect   ASSESSMENT:    1. Acute on chronic diastolic heart failure (HCC)   2. Medication management   3. Moderate to severe pulmonary hypertension (HCC)   4. Bilateral leg edema   5. Permanent atrial fibrillation   6. Encounter for education about heart failure    PLAN:    Severe peripheral edema 2/2 acute on chronic diastolic heart failure and severe pulmonary hypertension -please see my initial consult note--on review of the records, appears that TVR and full pHTN had been proposed in the past -notes state that she had lightheadedness on metolazone, and she was changed from lasix to bumex -reported dry weight per notes around 185 lbs -attempted to get BMET  today, but lab already closed. Will bring back in one week, with BMET to monitor renal function and potassium -still losing significant volume. Continue bumex 1 mg BID and spironolactone (taking 12.5 mg daily). -continue daily weights, leg elevation, compression stockings, salt avoidance -reviewed HF education again today  Hypertension: last BP 126/62, continue current medications  Atrial fibrillation, permanent -CHA2DS2/VAS=at least 5 -on coumadin, managed per PCP. INR today was low, having coumadin increased -on metoprolol for rate control  Plan for follow up: 1 week with BMET.  Medication Adjustments/Labs and Tests Ordered: Current medicines are reviewed at length with the patient today.  Concerns regarding medicines are outlined above.  Orders Placed This Encounter  Procedures  . Basic metabolic panel   No orders of the defined types were placed in this encounter.   Patient Instructions  Medication Instructions:  Your Physician recommend you continue on your current medication as directed.    If you need a refill on your cardiac medications before your next appointment, please call your pharmacy.   Lab work: Your physician recommends that you return for lab work in 1 week (BMP)    Testing/Procedures: None  Follow-Up: At [facility], you and your health needs are our priority.  As part of our continuing mission to provide you with exceptional heart care, we have created designated Provider Care Teams.  These Care Teams include your primary Cardiologist (physician) and Advanced Practice Providers (APPs -  Physician Assistants and Nurse Practitioners) who all work together to provide you with the care you need, when you need it. You will need a follow up appointment in 1 weeks.  Please call our office 2 months in advance to schedule this appointment.  You may see Jodelle RedBridgette Matteo Banke, MD or one of the following Advanced Practice Providers on your designated Care Team:    Theodore DemarkRhonda Barrett, PA-C . Joni ReiningKathryn Lawrence, DNP, ANP       Signed, Jodelle RedBridgette Slevin Gunby, MD PhD 03/15/2019 5:20 PM     [facility]

## 2019-03-21 ENCOUNTER — Telehealth: Payer: Self-pay | Admitting: Cardiology

## 2019-03-21 NOTE — Telephone Encounter (Signed)
New Message:   Pt daughter says she have to go in with pt for her appt. She says pt has trouble remembering. She said she went back with her before during this COVID-19 Crisis.

## 2019-03-21 NOTE — Telephone Encounter (Signed)
I called pt's daughter to confirm pt's appt with Dr Harrell Gave on 03-22-19.        COVID-19 Pre-Screening Questions:   In the past 7 to 10 days have you had a cough,  shortness of breath, headache, congestion, fever (100 or greater) body aches, chills, sore throat, or sudden loss of taste or sense of smell? no  Have you been around anyone with known Covid 19.  Have you been around anyone who is awaiting Covid 19 test results in the past 7 to 10 days?  no  Have you been around anyone who has been exposed to Covid 19, or has mentioned symptoms of Covid 19 within the past 7 to 10 days?  no  If you have any concerns/questions about symptoms patients report during screening (either on the phone or at threshold). Contact the provider seeing the patient or DOD for further guidance.  If neither are available contact a member of the leadership team.           ;

## 2019-03-22 ENCOUNTER — Other Ambulatory Visit: Payer: Self-pay

## 2019-03-22 ENCOUNTER — Ambulatory Visit (INDEPENDENT_AMBULATORY_CARE_PROVIDER_SITE_OTHER): Payer: Medicare HMO | Admitting: Cardiology

## 2019-03-22 ENCOUNTER — Encounter: Payer: Self-pay | Admitting: Cardiology

## 2019-03-22 VITALS — BP 101/70 | HR 70 | Temp 98.2°F | Ht 63.0 in | Wt 204.0 lb

## 2019-03-22 DIAGNOSIS — I272 Pulmonary hypertension, unspecified: Secondary | ICD-10-CM | POA: Diagnosis not present

## 2019-03-22 DIAGNOSIS — Z7189 Other specified counseling: Secondary | ICD-10-CM

## 2019-03-22 DIAGNOSIS — Z7901 Long term (current) use of anticoagulants: Secondary | ICD-10-CM | POA: Diagnosis not present

## 2019-03-22 DIAGNOSIS — I4821 Permanent atrial fibrillation: Secondary | ICD-10-CM | POA: Diagnosis not present

## 2019-03-22 DIAGNOSIS — R600 Localized edema: Secondary | ICD-10-CM

## 2019-03-22 DIAGNOSIS — Z79899 Other long term (current) drug therapy: Secondary | ICD-10-CM

## 2019-03-22 NOTE — Telephone Encounter (Signed)
FYI for appointment today-  Thanks!

## 2019-03-22 NOTE — Telephone Encounter (Signed)
Will make MD aware.

## 2019-03-22 NOTE — Patient Instructions (Signed)
Medication Instructions:  Your Physician recommend you continue on your current medication as directed.    If you need a refill on your cardiac medications before your next appointment, please call your pharmacy.   Lab work: Your physician recommends that you return for lab work today (BMP)  If you have labs (blood work) drawn today and your tests are completely normal, you will receive your results only by: [name] Kitchen MyChart Message (if you have MyChart) OR . A paper copy in the mail If you have any lab test that is abnormal or we need to change your treatment, we will call you to review the results.  Testing/Procedures: None  Follow-Up: At [facility], you and your health needs are our priority.  As part of our continuing mission to provide you with exceptional heart care, we have created designated Provider Care Teams.  These Care Teams include your primary Cardiologist (physician) and Advanced Practice Providers (APPs -  Physician Assistants and Nurse Practitioners) who all work together to provide you with the care you need, when you need it. You will need a virtual follow up appointment in 2 weeks.  Please call our office 2 months in advance to schedule this appointment.  You may see Buford Dresser, MD or one of the following Advanced Practice Providers on your designated Care Team:   Rosaria Ferries, PA-C . Jory Sims, DNP, ANP

## 2019-03-22 NOTE — Progress Notes (Signed)
Cardiology Office Note:    Date:  03/22/2019   ID:  [name], DOB 06/26/1928, MRN [id number]  PCP:  Shirlean MylarWebb, Carol, MD  Cardiologist:  Jodelle RedBridgette Jaylenn Baiza, MD PhD  Referring MD: Shirlean MylarWebb, Carol, MD   CC: close follow up of LE edema  History of Present Illness:    [name] is a 85 y.o. female with a hx of atrial fibrillation on coumadin, essential hypertension, pulmonary hypertension, and chronic diastolic heart failure. Her initial consult (telemedicine) was 11/25/18.  Cardiac history: Previously followed by Dr. Orvis [facility]illPlitt at [facility] (notes under media tab). Per notes:  -echo 2014, lexiscan 2015 low risk,  -echo 2019 hyperdynamic EF, septal flattening, severe RV dilation, RVSP noted as critical (near systemic) pulmonary hypertension, severe biatrial enlargement, dilated coronary sinus ?persistent left SVC.  -Venous study 2019 with bilateral mild insufficiency -Echo 11/10/17 with hyperdynamic EF, septal flattening, severe RV dilation, severe TR, severe pulmonary hypertension (listed at 71 mmHg in note)  Today: Wt down to 198. Legs slowly improving. Does have two small areas of weeping on front left calf, dressed today. Prior reported dry weight was 185 lbs.  We spent a long time today discussing the cause of her swelling. I discussed that all I had was the history/timeline documented previously. I discussed that her workup before noted severe pulmonary hypertension with near systemic pulm pressures, severe TR, severe RV dilation. Notes suggest discussion for pHTN workup as well as possible TV repair were discussed and declined. Patient reaffirms today that she would not want surgery.   [name], the patient's daughter, does endorse that she has had blood clots and thinks some have gone to her lungs. She things at least three, potentially more, times she has been told she has clots in her lungs. I do not have records of this. Her EF is normal to hyperdynamic, and she had a low risk  lexiscan. I do not have diastolic information available.  We discussed that because of her severe pulmonary hypertension, as well as documented venous insufficiency, she will likely always struggle with some fluid. Discussed anatomy, how the right heart affects fluid, etc.  We discussed continued vigilance about diet, foot elevation, and compression stockings.   Denies chest pain, shortness of breath at rest. No PND. Still with mild orthopnea, LE edema improving but still severe. No unexpected weight gain. No syncope or palpitations.  Past Medical History:  Diagnosis Date  . Anticoagulant long-term use   . Arrhythmia   . Arthritis   . Atrial fibrillation (HCC)   . Congestive heart failure (CHF) (HCC)   . Dysphagia   . Esophageal reflux   . Essential hypertension   . Hiatal hernia   . Pulmonary embolus (HCC)   . Thyroid disease     Past Surgical History:  Procedure Laterality Date  . COLONOSCOPY    . ELBOW SURGERY    . LAPAROSCOPIC HYSTERECTOMY      Current Medications: Current Outpatient Medications on File Prior to Visit  Medication Sig  . bumetanide (BUMEX) 1 MG tablet Take 1 tablet (1 mg total) by mouth 2 (two) times daily. Take as directed  . cholecalciferol (VITAMIN D3) 25 MCG (1000 UT) tablet Take 1,000 Units by mouth 2 (two) times daily.   [name] Kitchen. gabapentin (NEURONTIN) 300 MG capsule Take 600 mg by mouth 2 (two) times daily.   [name] Kitchen. levothyroxine (SYNTHROID) 112 MCG tablet Take 112 mcg by mouth daily before breakfast.   . losartan (COZAAR) 100 MG tablet  Take 100 mg by mouth daily.  . metoprolol tartrate (LOPRESSOR) 50 MG tablet Take 50 mg by mouth 2 (two) times daily.  . Multiple Vitamins-Minerals (PRESERVISION/LUTEIN) CAPS Take 1 capsule by mouth 2 (two) times daily.   . nortriptyline (PAMELOR) 10 MG capsule Take 10 mg by mouth at bedtime.  . pantoprazole (PROTONIX) 40 MG tablet Take 40 mg by mouth daily.  [name] Kitchen. spironolactone (ALDACTONE) 25 MG tablet Take 1 tablet (25 mg total)  by mouth daily. Every morning with breakfast.  . warfarin (COUMADIN) 1 MG tablet Take 1 mg by mouth one time only at 6 PM. 2 tablets on Monday, and Thursday and 1 tablet on remaining days.   No current facility-administered medications on file prior to visit.      Allergies:   Acetaminophen, Amoxicillin, Codeine, Erythromycin, Iodine, Oxycodone, Oxycodone-acetaminophen, Penicillins, Sulfasalazine, and Tramadol   Social History   Socioeconomic History  . Marital status: Widowed    Spouse name: Not on file  . Number of children: Not on file  . Years of education: Not on file  . Highest education level: Not on file  Occupational History  . Not on file  Social Needs  . Financial resource strain: Not on file  . Food insecurity    Worry: Not on file    Inability: Not on file  . Transportation needs    Medical: Not on file    Non-medical: Not on file  Tobacco Use  . Smoking status: Former Smoker    Packs/day: 1.00    Types: Cigarettes    Quit date: 10/02/2001    Years since quitting: 17.4  . Smokeless tobacco: Never Used  . Tobacco comment: former smoker  Substance and Sexual Activity  . Alcohol use: Not on file  . Drug use: Not on file  . Sexual activity: Not on file  Lifestyle  . Physical activity    Days per week: Not on file    Minutes per session: Not on file  . Stress: Not on file  Relationships  . Social Musicianconnections    Talks on phone: Not on file    Gets together: Not on file    Attends religious service: Not on file    Active member of club or organization: Not on file    Attends meetings of clubs or organizations: Not on file    Relationship status: Not on file  Other Topics Concern  . Not on file  Social History Narrative  . Not on file     Family History: The patient's family history includes Cancer in her brother; Stroke in her mother.  ROS:   Please see the history of present illness.  Additional pertinent ROS: Constitutional: Negative for chills,  fever, night sweats, unintentional weight loss. Always feels a little cold at baseline HENT: Negative for ear pain and hearing loss.   Eyes: Negative for loss of vision and eye pain.  Respiratory: Negative for cough, sputum, wheezing.   Cardiovascular: See HPI. Gastrointestinal: Negative for abdominal pain, melena, and hematochezia.  Genitourinary: Negative for dysuria and hematuria.  Musculoskeletal: Negative for falls and myalgias.  Skin: Negative for itching and rash.  Neurological: Negative for focal weakness, focal sensory changes and loss of consciousness.  Endo/Heme/Allergies: Does not bruise/bleed easily.    EKGs/Labs/Other Studies Reviewed:    The following studies were reviewed today: See cardiac history, above  EKG:  EKG is personally reviewed.  The ekg ordered 03/02/19 demonstrates afib at 65 bpm  Recent Labs: No  results found for requested labs within last 8760 hours.  Recent Lipid Panel No results found for: CHOL, TRIG, HDL, CHOLHDL, VLDL, LDLCALC, LDLDIRECT  Physical Exam:    VS:  BP 101/70   Pulse 70   Temp 98.2 F (36.8 C)   Ht 5\' 3"  (1.6 m)   Wt 204 lb (92.5 kg)   SpO2 91%   BMI 36.14 kg/m     Wt Readings from Last 3 Encounters:  03/22/19 204 lb (92.5 kg)  03/15/19 207 lb (93.9 kg)  03/02/19 227 lb 3.2 oz (103.1 kg)  Home wt 198   GEN: frail appearing, in no acute distress HEENT: Normal NECK: JVD with TR pulsations visible above clavicle sitting upright LYMPHATICS: No lymphadenopathy CARDIAC: irregularly irregular rhythm, normal S1 and S2, no murmurs, rubs, gallops. +RV heave. Radial pulses 2+ bilaterally. RESPIRATORY:  Clear to auscultation without rales, wheezing or rhonchi  ABDOMEN: Soft, non-tender, non-distended MUSCULOSKELETAL:  +bilateral LE edema, severe, pitting with two punctate areas on anterior left calf draining clear fluid. Improved from exam last week though still severe. SKIN: Warm and dry NEUROLOGIC:  Alert and oriented x  3 PSYCHIATRIC:  Normal affect   ASSESSMENT:    No diagnosis found. PLAN:    Severe peripheral edema 2/2 severe pulmonary hypertension -improving today, but still very severe. We discussed at length today that this would be a chronic issue given her pHTN -unclear etiology of her pHTN. Daughter reports history of possible PE multiple times. Declined workup per notes in the past. -notes state that she had lightheadedness on metolazone, and she was changed from lasix to bumex -reported dry weight per notes around 185 lbs -check BMET today. Last Cr per [facility] was 1.09 02/21/19. If Cr elevated, would cut back bumex to once daily and monitor closely for increasing weight/fluid -Continue bumex 1 mg BID (or daily if Cr elevated) and spironolactone (taking 12.5 mg daily). -continue daily weights, leg elevation, compression stockings, salt avoidance -reviewed HF education again today  Hypertension: running low today but feeling ok  Atrial fibrillation, permanent -CHA2DS2/VAS=at least 5 -on coumadin, managed per PCP. INR today was low, having coumadin increased -on metoprolol for rate control  Plan for follow up: 2 weeks virtually  Medication Adjustments/Labs and Tests Ordered: Current medicines are reviewed at length with the patient today.  Concerns regarding medicines are outlined above.  Orders Placed This Encounter  Procedures  . Basic metabolic panel   No orders of the defined types were placed in this encounter.   Patient Instructions  Medication Instructions:  Your Physician recommend you continue on your current medication as directed.    If you need a refill on your cardiac medications before your next appointment, please call your pharmacy.   Lab work: Your physician recommends that you return for lab work today (BMP)  If you have labs (blood work) drawn today and your tests are completely normal, you will receive your results only by: [name] Kitchen MyChart Message (if you have  MyChart) OR . A paper copy in the mail If you have any lab test that is abnormal or we need to change your treatment, we will call you to review the results.  Testing/Procedures: None  Follow-Up: At [facility], you and your health needs are our priority.  As part of our continuing mission to provide you with exceptional heart care, we have created designated Provider Care Teams.  These Care Teams include your primary Cardiologist (physician) and Advanced Practice Providers (APPs -  Physician  Assistants and Nurse Practitioners) who all work together to provide you with the care you need, when you need it. You will need a virtual follow up appointment in 2 weeks.  Please call our office 2 months in advance to schedule this appointment.  You may see Jodelle RedBridgette Trygg Mantz, MD or one of the following Advanced Practice Providers on your designated Care Team:   Theodore DemarkRhonda Barrett, PA-C . Joni ReiningKathryn Lawrence, DNP, ANP        Signed, Jodelle RedBridgette Kimberlin Scheel, MD PhD 03/22/2019  [facility]

## 2019-03-23 ENCOUNTER — Encounter: Payer: Self-pay | Admitting: Cardiology

## 2019-03-23 LAB — BASIC METABOLIC PANEL
BUN/Creatinine Ratio: 18 (ref 12–28)
BUN: 27 mg/dL (ref 8–27)
CO2: 26 mmol/L (ref 20–29)
Calcium: 9.6 mg/dL (ref 8.7–10.3)
Chloride: 93 mmol/L — ABNORMAL LOW (ref 96–106)
Creatinine, Ser: 1.46 mg/dL — ABNORMAL HIGH (ref 0.57–1.00)
GFR calc Af Amer: 37 mL/min/{1.73_m2} — ABNORMAL LOW (ref >59)
GFR calc non Af Amer: 32 mL/min/{1.73_m2} — ABNORMAL LOW (ref >59)
Glucose: 93 mg/dL (ref 65–99)
Potassium: 4.3 mmol/L (ref 3.5–5.2)
Sodium: 136 mmol/L (ref 134–144)

## 2019-03-24 ENCOUNTER — Other Ambulatory Visit: Payer: Self-pay

## 2019-03-24 DIAGNOSIS — I5033 Acute on chronic diastolic (congestive) heart failure: Secondary | ICD-10-CM

## 2019-03-24 MED ORDER — BUMETANIDE 1 MG PO TABS: 1.0000 mg | ORAL_TABLET | Freq: Every day | ORAL | 2 refills | Status: DC

## 2019-04-04 NOTE — Progress Notes (Signed)
Virtual Visit via Telephone Note   This visit type was conducted due to national recommendations for restrictions regarding the COVID-19 Pandemic (e.g. social distancing) in an effort to limit this patient's exposure and mitigate transmission in our community.  Due to her co-morbid illnesses, this patient is at least at moderate risk for complications without adequate follow up.  This format is felt to be most appropriate for this patient at this time.  The patient did not have access to video technology/had technical difficulties with video requiring transitioning to audio format only (telephone).  All issues noted in this document were discussed and addressed.  No physical exam could be performed with this format.  Please refer to the patient's chart for her  consent to telehealth for [facility].   Date:  04/05/2019   ID:  Megan MorrowHattie M [name], DOB 02/10/1928, MRN [id number]  Patient Location: Home Provider Location: Office  PCP:  Shirlean MylarWebb, Carol, MD  Cardiologist:  Jodelle RedBridgette Christopher, MD  Electrophysiologist:  None   Evaluation Performed:  Follow-Up Visit  Chief Complaint: Chronic Diastolic CHF  History of Present Illness:    [name] is a 85 y.o. female who presents for ongoing assessment and management of atrial fibrillation on coumadin, chronic diastolic heart failure, pulmonary hypertension, and chronic LEE.   Per Dr. Di Kindlehristopher's last note 03/15/2019 Cardiac history: Previously followed by [facility]. [facility]Plitt [facility] (notes under media tab). Per notes:  -echo 2014, lexiscan 2015 low risk,  -echo 2019 hyperdynamic EF, septal flattening, severe RV dilation, RVSP noted as critical (near systemic) pulmonary hypertension, severe biatrial enlargement, dilated coronary sinus ?persistent left SVC.  -Venous study 2019 with bilateral mild insufficiency -Echo 11/10/17 with hyperdynamic EF, septal flattening, severe RV dilation, severe TR, severe pulmonary hypertension (listed at 71  mmHg in note).  Dr.Christopher has noted that she is not tolerant of metolazone due to significant lightheadedness. She remained on Bumex, with dry weight of 185 lbs. She was continued on coumadin for CHADS VASC Score of 4. She was to have BMET for follow up. Creatinine 1.46, potassium 4.3.    Wound on lower left leg, oozing greenish drainage. Dressing changes by daughter with follow up with Dr. Hyman HopesWebb.  Due to see Dr. Hyman HopesWebb first week of August.  Dr. Hyman HopesWebb also manages Coumadin dosing.  She is walking much better using her walker. She walked 30 yards without issues with normal breathing. She has lost down to 188 lbs. She has been more active she denies chest pain, fatigue, increasing lower extremity edema.  Her daughter is very attentive to her and manages her medications.  They have followed very strict COVID restrictions.  The patient does not have symptoms concerning for COVID-19 infection (fever, chills, cough, or new shortness of breath).    Past Medical History:  Diagnosis Date  . Anticoagulant long-term use   . Arrhythmia   . Arthritis   . Atrial fibrillation (HCC)   . Congestive heart failure (CHF) (HCC)   . Dysphagia   . Esophageal reflux   . Essential hypertension   . Hiatal hernia   . Pulmonary embolus (HCC)   . Thyroid disease    Past Surgical History:  Procedure Laterality Date  . COLONOSCOPY    . ELBOW SURGERY    . LAPAROSCOPIC HYSTERECTOMY       Current Meds  Medication Sig  . bumetanide (BUMEX) 1 MG tablet Take 1 tablet (1 mg total) by mouth daily. Take as directed  . cholecalciferol (  VITAMIN D3) 25 MCG (1000 UT) tablet Take 1,000 Units by mouth 2 (two) times daily.   [name] Kitchen. gabapentin (NEURONTIN) 300 MG capsule Take 600 mg by mouth 2 (two) times daily.   [name] Kitchen. levothyroxine (SYNTHROID) 112 MCG tablet Take 112 mcg by mouth daily before breakfast.   . losartan (COZAAR) 100 MG tablet Take 100 mg by mouth daily.  . metoprolol tartrate (LOPRESSOR) 50 MG tablet Take 50 mg by  mouth 2 (two) times daily.  . Multiple Vitamins-Minerals (PRESERVISION/LUTEIN) CAPS Take 1 capsule by mouth 2 (two) times daily.   . mupirocin ointment (BACTROBAN) 2 % Place 1 application into the nose 2 (two) times daily.  . nortriptyline (PAMELOR) 10 MG capsule Take 10 mg by mouth at bedtime.  . pantoprazole (PROTONIX) 40 MG tablet Take 40 mg by mouth daily.  [name] Kitchen. spironolactone (ALDACTONE) 25 MG tablet Take by mouth daily. Pt takes 12.5mg  daily  . warfarin (COUMADIN) 1 MG tablet Take 1 mg by mouth one time only at 6 PM. 2 tablets on Monday, and Thursday and Saturday and 1 tablet on remaining days.     Allergies:   Acetaminophen, Amoxicillin, Codeine, Erythromycin, Iodine, Oxycodone, Oxycodone-acetaminophen, Penicillins, Sulfasalazine, and Tramadol   Social History   Tobacco Use  . Smoking status: Former Smoker    Packs/day: 1.00    Types: Cigarettes    Quit date: 10/02/2001    Years since quitting: 17.5  . Smokeless tobacco: Never Used  . Tobacco comment: former smoker  Substance Use Topics  . Alcohol use: Not on file  . Drug use: Not on file     Family Hx: The patient's family history includes Cancer in her brother; Stroke in her mother.  ROS:   Please see the history of present illness.    All other systems reviewed and are negative.   Prior CV studies:   The following studies were reviewed today: Echocardiogram dated 11/10/2017 (transcribed from scanned report-please see report for further details.)  1.  LV ejection fraction is greater than 70%.  There is flattening of the intraventricular septum in both             systole and diastole consistent with right ventricular pressure and volume overload. 2.  Severely dilated right ventricle.  RV systolic pressures consistent with severe pulmonary                          hypertension. 3.  There is severe tricuspid regurgitation.  Labs/Other Tests and Data Reviewed:   ECG reviewed.  EKG:  No  Recent Labs: 03/22/2019: BUN 27;  Creatinine, Ser 1.46; Potassium 4.3; Sodium 136   Recent Lipid Panel No results found for: CHOL, TRIG, HDL, CHOLHDL, LDLCALC, LDLDIRECT  Wt Readings from Last 3 Encounters:  04/05/19 188 lb 9.6 oz (85.5 kg)  03/22/19 204 lb (92.5 kg)  03/15/19 207 lb (93.9 kg)     Objective:    Vital Signs:  BP 121/64   Pulse 61   Ht 5\' 3"  (1.6 m)   Wt 188 lb 9.6 oz (85.5 kg)   BMI 33.41 kg/m    VITAL SIGNS:  reviewed GEN:  no acute distress NEURO:  alert and oriented x 3, no obvious focal deficit PSYCH:  normal affect  ASSESSMENT & PLAN:    1. Chronic Diastolic CHF: The patient has lost approximately 35 pounds since being started on diuretic therapy and optimal medical therapy.  I will not make any changes in her  current therapy.  The patient is responded well to therapy is up walking around using a walker is not having any shortness of breath or pressure in her chest.  2. LEE Wound: Daughter is dressing it and applying Ace bandage.  It is being followed by her primary care Dr. Justin Mend.  3.  Atrial fib: Heart rate is currently well controlled.  She remains on Coumadin therapy which is also followed by primary care physician.  She denies rapid heart rhythm or shortness of breath with activity.  She remains on metoprolol 50 mg twice daily.  4.  Hypertension: Blood pressures currently well controlled at home.  She will remain on spironolactone, metoprolol.  COVID-19 Education: The signs and symptoms of COVID-19 were discussed with the patient and how to seek care for testing (follow up with PCP or arrange E-visit).  The importance of social distancing was discussed today.  Time:   Today, I have spent 15 minutes with the patient with telehealth technology discussing the above problems.     Medication Adjustments/Labs and Tests Ordered: Current medicines are reviewed at length with the patient today.  Concerns regarding medicines are outlined above.   Tests Ordered: No orders of the defined  types were placed in this encounter.   Medication Changes: No orders of the defined types were placed in this encounter.   Disposition:  Follow up follow-up with Dr. Harrell Gave in October in person visit.  Signed, Phill Myron. West Pugh, ANP, AACC  04/05/2019 2:37 PM    [facility]

## 2019-04-05 ENCOUNTER — Telehealth: Payer: Self-pay | Admitting: Adult Health

## 2019-04-05 ENCOUNTER — Telehealth (INDEPENDENT_AMBULATORY_CARE_PROVIDER_SITE_OTHER): Payer: Medicare HMO | Admitting: Adult Health

## 2019-04-05 ENCOUNTER — Encounter: Payer: Self-pay | Admitting: Adult Health

## 2019-04-05 VITALS — BP 121/64 | HR 61 | Ht 63.0 in | Wt 188.6 lb

## 2019-04-05 DIAGNOSIS — I4891 Unspecified atrial fibrillation: Secondary | ICD-10-CM

## 2019-04-05 DIAGNOSIS — I5032 Chronic diastolic (congestive) heart failure: Secondary | ICD-10-CM

## 2019-04-05 DIAGNOSIS — I10 Essential (primary) hypertension: Secondary | ICD-10-CM

## 2019-04-05 NOTE — Telephone Encounter (Signed)
LVM, asking pt to call back and give consent for Virtual Visit on 04-05-19. °

## 2019-04-05 NOTE — Patient Instructions (Signed)
Medication Instructions:  Continue current medications  If you need a refill on your cardiac medications before your next appointment, please call your pharmacy.  Labwork: None Ordered   Testing/Procedures: None Ordered  Follow-Up: . You have an appointment Monday October 26th @ 2:40 with Dr Harrell Gave  At [facility], you and your health needs are our priority.  As part of our continuing mission to provide you with exceptional heart care, we have created designated Provider Care Teams.  These Care Teams include your primary Cardiologist (physician) and Advanced Practice Providers (APPs -  Physician Assistants and Nurse Practitioners) who all work together to provide you with the care you need, when you need it.  Thank you for choosing [facility] at [facility]!!

## 2019-04-05 NOTE — Telephone Encounter (Signed)
Virtual Visit Pre-Appointment Phone Call  "(Name), I am calling you today to discuss your upcoming appointment. We are currently trying to limit exposure to the virus that causes COVID-19 by seeing patients at home rather than in the office."  1. Confirm consent - "In the setting of the current Covid19 crisis, you are scheduled for a (phone or video) visit with your provider on (date) at (time).  Just as we do with many in-office visits, in order for you to participate in this visit, we must obtain consent.  If you'd like, I can send this to your mychart (if signed up) or email for you to review.  Otherwise, I can obtain your verbal consent now.  All virtual visits are billed to your insurance company just like a normal visit would be.  By agreeing to a virtual visit, we'd like you to understand that the technology does not allow for your provider to perform an examination, and thus may limit your provider's ability to fully assess your condition. If your provider identifies any concerns that need to be evaluated in person, we will make arrangements to do so.  Finally, though the technology is pretty good, we cannot assure that it will always work on either your or our end, and in the setting of a video visit, we may have to convert it to a phone-only visit.  In either situation, we cannot ensure that we have a secure connection.  Are you willing to proceed?" STAFF: Did the patient verbally acknowledge consent to telehealth visit? Document YES/NO here: Yes  [place] VISIT   I hereby voluntarily request, consent and authorize [facility] and its employed or contracted physicians, physician assistants, nurse practitioners or other licensed health care professionals (the Practitioner), to provide me with telemedicine health care services (the Services") as deemed necessary by the treating Practitioner. I acknowledge and consent to receive the Services by the Practitioner via  telemedicine. I understand that the telemedicine visit will involve communicating with the Practitioner through live audiovisual communication technology and the disclosure of certain medical information by electronic transmission. I acknowledge that I have been given the opportunity to request an in-person assessment or other available alternative prior to the telemedicine visit and am voluntarily participating in the telemedicine visit.  I understand that I have the right to withhold or withdraw my consent to the use of telemedicine in the course of my care at any time, without affecting my right to future care or treatment, and that the Practitioner or I may terminate the telemedicine visit at any time. I understand that I have the right to inspect all information obtained and/or recorded in the course of the telemedicine visit and may receive copies of available information for a reasonable fee.  I understand that some of the potential risks of receiving the Services via telemedicine include:   Delay or interruption in medical evaluation due to technological equipment failure or disruption;  Information transmitted may not be sufficient (e.g. poor resolution of images) to allow for appropriate medical decision making by the Practitioner; and/or   In rare instances, security protocols could fail, causing a breach of personal health information.  Furthermore, I acknowledge that it is my responsibility to provide information about my medical history, conditions and care that is complete and accurate to the best of my ability. I acknowledge that Practitioner's advice, recommendations, and/or decision may be based on factors not within their control, such as incomplete or inaccurate data provided  by me or distortions of diagnostic images or specimens that may result from electronic transmissions. I understand that the practice of medicine is not an exact science and that Practitioner makes no warranties or  guarantees regarding treatment outcomes. I acknowledge that I will receive a copy of this consent concurrently upon execution via email to the email address I last provided but may also request a printed copy by calling the office of [place].    I understand that my insurance will be billed for this visit.   I have read or had this consent read to me.  I understand the contents of this consent, which adequately explains the benefits and risks of the Services being provided via telemedicine.   I have been provided ample opportunity to ask questions regarding this consent and the Services and have had my questions answered to my satisfaction.  I give my informed consent for the services to be provided through the use of telemedicine in my medical care  By participating in this telemedicine visit I agree to the above.

## 2019-04-07 ENCOUNTER — Telehealth: Payer: Self-pay | Admitting: Adult Health

## 2019-04-07 NOTE — Telephone Encounter (Signed)
New Message:    Daughter called  And said she forgot to ask how many fluid pills should pt be taking a day? Also when does she need to have lab work to see how her liver is functioning?  If she is not there, please leave her a detailed message.

## 2019-04-07 NOTE — Telephone Encounter (Signed)
Notes recorded by Meryl Crutch, RN on 03/24/2019 at 3:59 PM EDT  Daughter updated and voiced understanding. Med list updated.  ------  Notes recorded by Meryl Crutch, RN on 03/24/2019 at 11:51 AM EDT  Left message to call back.  ------  Notes recorded by Waylan Rocher, LPN on 8/63/8177 at 1:16 PM EDT  [email or web address]-daughter Algie Coffer [phone]  ------  Notes recorded by Buford Dresser, MD on 03/23/2019 at 9:15 AM EDT  Potassium is fine, kidney function is slightly worse from labs drawn in PCP office (Cr has gone from 1.09 to 1.46). I would keep the spironolactone and the morning dose of Bumex, but I would stop the afternoon bumex dose for now. If the daily weights or leg swelling gets worse off of the second dose of bumex, please call me. Otherwise we will stay with this plan for now and check in as we discussed in a few weeks.

## 2019-05-21 ENCOUNTER — Other Ambulatory Visit: Payer: Self-pay | Admitting: General Practice

## 2019-05-21 DIAGNOSIS — I5033 Acute on chronic diastolic (congestive) heart failure: Secondary | ICD-10-CM

## 2019-06-10 ENCOUNTER — Other Ambulatory Visit: Payer: Self-pay

## 2019-06-10 ENCOUNTER — Ambulatory Visit (INDEPENDENT_AMBULATORY_CARE_PROVIDER_SITE_OTHER): Payer: Medicare HMO | Admitting: Cardiology

## 2019-06-10 ENCOUNTER — Encounter: Payer: Self-pay | Admitting: Cardiology

## 2019-06-10 VITALS — BP 119/59 | HR 57 | Ht 63.0 in | Wt 189.2 lb

## 2019-06-10 DIAGNOSIS — R600 Localized edema: Secondary | ICD-10-CM

## 2019-06-10 DIAGNOSIS — I10 Essential (primary) hypertension: Secondary | ICD-10-CM

## 2019-06-10 DIAGNOSIS — Z7901 Long term (current) use of anticoagulants: Secondary | ICD-10-CM

## 2019-06-10 DIAGNOSIS — I272 Pulmonary hypertension, unspecified: Secondary | ICD-10-CM

## 2019-06-10 DIAGNOSIS — I4821 Permanent atrial fibrillation: Secondary | ICD-10-CM

## 2019-06-10 DIAGNOSIS — I5032 Chronic diastolic (congestive) heart failure: Secondary | ICD-10-CM

## 2019-06-10 NOTE — Patient Instructions (Signed)

## 2019-06-10 NOTE — Progress Notes (Signed)
Cardiology Office Note:    Date:  06/10/2019   ID:  [name], DOB 06/29/1928, MRN [id number]  PCP:  Shirlean MylarWebb, Carol, MD  Cardiologist:  Jodelle RedBridgette Starling Christofferson, MD PhD  Referring MD: Shirlean MylarWebb, Carol, MD   CC: close follow up of LE edema  History of Present Illness:    [name] is a 85 y.o. female with a hx of atrial fibrillation on coumadin, essential hypertension, pulmonary hypertension, and chronic diastolic heart failure. Her initial consult (telemedicine) was 11/25/18.  Cardiac history: Previously followed by Dr. Orvis BrillPlitt at [facility] (notes under media tab). Per notes:  -echo 2014, lexiscan 2015 low risk,  -echo 2019 hyperdynamic EF, septal flattening, severe RV dilation, RVSP noted as critical (near systemic) pulmonary hypertension, severe biatrial enlargement, dilated coronary sinus ?persistent left SVC.  -Venous study 2019 with bilateral mild insufficiency -Echo 11/10/17 with hyperdynamic EF, septal flattening, severe RV dilation, severe TR, severe pulmonary hypertension (listed at 71 mmHg in note)  Today: Patient and her daughter both feel that she is doing well. On home scale, weight was 182.6 lbs. Walking better, feeling better. Brings extensive log of weights, reviewed.  Wants to go to her home in [place] to give away furniture, asking about risk to travel. Also asking about whether she can live independently. Concern is that when living independently previously, she took diuretics intermittently and gained >40 lbs fluid.  We spent significant time today reviewing her pulmonary hypertension, right vs. Left heart failure. We reviewed that fluid/edema will be a long term issue with her pulmonary hypertension. Reviewed that temporary holds on diuretics (ie for a long car trip) are ok, but frequent holding of diuretics is likely to results in fluid retention/weight gain. Discussed importance of long term use of diuretics. Reviewed again that prior workup showed pulmonary  hypertension, and we have discussed in the past and again today that [name] does not want invasive evaluation of this. Discussed that we do not know etiology of pHTN, could be CTEPH or other.   We discussed continued vigilance about diet, foot elevation, and compression stockings.   Reviewed most recent labs 03/2019, but reported that it was done since by Dr. Hyman HopesWebb. Was referred to kidney specialist because something was low, not sure what. I reviewed labs in [facility], Cr 1.4, K 4.4  Doing bumex 1 mg daily, spironolactone 12.5 mg every day. Tolerating this well.  Denies chest pain, shortness of breath at rest or with normal exertion. PND, orthopnea, LE edema significantly. No syncope or palpitations.  Past Medical History:  Diagnosis Date  . Anticoagulant long-term use   . Arrhythmia   . Arthritis   . Atrial fibrillation (HCC)   . Congestive heart failure (CHF) (HCC)   . Dysphagia   . Esophageal reflux   . Essential hypertension   . Hiatal hernia   . Pulmonary embolus (HCC)   . Thyroid disease     Past Surgical History:  Procedure Laterality Date  . COLONOSCOPY    . ELBOW SURGERY    . LAPAROSCOPIC HYSTERECTOMY      Current Medications: Current Outpatient Medications on File Prior to Visit  Medication Sig  . bumetanide (BUMEX) 1 MG tablet Take 1 tablet (1 mg total) by mouth daily.  . cholecalciferol (VITAMIN D3) 25 MCG (1000 UT) tablet Take 1,000 Units by mouth 2 (two) times daily.   [name] Kitchen. gabapentin (NEURONTIN) 300 MG capsule Take 600 mg by mouth 2 (two) times daily.   [name] Kitchen. levothyroxine (SYNTHROID)  112 MCG tablet Take 112 mcg by mouth daily before breakfast.   . losartan (COZAAR) 100 MG tablet Take 100 mg by mouth daily.  . metoprolol tartrate (LOPRESSOR) 50 MG tablet Take 50 mg by mouth 2 (two) times daily.  . Multiple Vitamins-Minerals (PRESERVISION/LUTEIN) CAPS Take 1 capsule by mouth 2 (two) times daily.   . mupirocin ointment (BACTROBAN) 2 % Place 1 application into the nose 2  (two) times daily.  . nortriptyline (PAMELOR) 10 MG capsule Take 10 mg by mouth at bedtime.  . pantoprazole (PROTONIX) 40 MG tablet Take 40 mg by mouth daily.  [name] Kitchen spironolactone (ALDACTONE) 25 MG tablet Take by mouth daily. Pt takes 12.5mg  daily  . warfarin (COUMADIN) 1 MG tablet Take 1 mg by mouth one time only at 6 PM. 2 tablets on Monday, and Thursday and Saturday and 1 tablet on remaining days.   No current facility-administered medications on file prior to visit.      Allergies:   Acetaminophen, Amoxicillin, Codeine, Erythromycin, Iodine, Oxycodone, Oxycodone-acetaminophen, Penicillins, Sulfasalazine, and Tramadol   Social History   Socioeconomic History  . Marital status: Widowed    Spouse name: Not on file  . Number of children: Not on file  . Years of education: Not on file  . Highest education level: Not on file  Occupational History  . Not on file  Social Needs  . Financial resource strain: Not on file  . Food insecurity    Worry: Not on file    Inability: Not on file  . Transportation needs    Medical: Not on file    Non-medical: Not on file  Tobacco Use  . Smoking status: Former Smoker    Packs/day: 1.00    Types: Cigarettes    Quit date: 10/02/2001    Years since quitting: 17.6  . Smokeless tobacco: Never Used  . Tobacco comment: former smoker  Substance and Sexual Activity  . Alcohol use: Not on file  . Drug use: Not on file  . Sexual activity: Not on file  Lifestyle  . Physical activity    Days per week: Not on file    Minutes per session: Not on file  . Stress: Not on file  Relationships  . Social Musician on phone: Not on file    Gets together: Not on file    Attends religious service: Not on file    Active member of club or organization: Not on file    Attends meetings of clubs or organizations: Not on file    Relationship status: Not on file  Other Topics Concern  . Not on file  Social History Narrative  . Not on file     Family  History: The patient's family history includes Cancer in her brother; Stroke in her mother.  ROS:   Please see the history of present illness.  Additional pertinent ROS: Constitutional: Negative for chills, fever, night sweats, unintentional weight loss. Always feels cold at baseline. HENT: Negative for ear pain and hearing loss.   Eyes: Negative for loss of vision and eye pain.  Respiratory: Negative for cough, sputum, wheezing.   Cardiovascular: See HPI. Gastrointestinal: Negative for abdominal pain, melena, and hematochezia.  Genitourinary: Negative for dysuria and hematuria.  Musculoskeletal: Negative for falls and myalgias.  Skin: Negative for itching and rash.  Neurological: Negative for focal weakness, focal sensory changes and loss of consciousness.  Endo/Heme/Allergies: Does not bruise/bleed easily.    EKGs/Labs/Other Studies Reviewed:  The following studies were reviewed today: See cardiac history, above  EKG:  EKG is personally reviewed.  The ekg ordered 03/02/19 demonstrates afib at 65 bpm  Recent Labs: 03/22/2019: BUN 27; Creatinine, Ser 1.46; Potassium 4.3; Sodium 136  Recent Lipid Panel No results found for: CHOL, TRIG, HDL, CHOLHDL, VLDL, LDLCALC, LDLDIRECT  Physical Exam:    VS:  BP (!) 119/59   Pulse (!) 57   Ht  (1.6 m)   Wt 189 lb 3.2 oz (85.8 kg)   SpO2 96%   BMI 33.52 kg/m     Wt Readings from Last 3 Encounters:  06/10/19 189 lb 3.2 oz (85.8 kg)  04/05/19 188 lb 9.6 oz (85.5 kg)  03/22/19 204 lb (92.5 kg)   GEN: Well nourished, well developed in no acute distress HEENT: Normal, moist mucous membranes NECK: No sustained JVD but TR pulsations visible above clavicle while sitting upright in wheelchair CARDIAC: irregularly irregular rhythm, normal S1 and S2, no rubs, gallops. +RV heave, +2/6 SM VASCULAR: Radial and DP pulses 2+ bilaterally. No carotid bruits RESPIRATORY:  Clear to auscultation without rales, wheezing or rhonchi  ABDOMEN: Soft,  non-tender, non-distended MUSCULOSKELETAL:  Ambulates independently SKIN: Warm and dry, Edema only minimal and significantly improved bilaterally, no weeping, wrinkles in skin, not tense at all. NEUROLOGIC:  Alert and oriented x 3. No focal neuro deficits noted. PSYCHIATRIC:  Normal affect   ASSESSMENT:    1. Moderate to severe pulmonary hypertension (HCC)   2. Diastolic CHF, chronic (HCC)   3. Essential hypertension   4. Bilateral leg edema   5. Permanent atrial fibrillation (HCC)   6. Anticoagulant long-term use    PLAN:    Peripheral edema 2/2 severe pulmonary hypertension and chronic diastolic heart failure -much improved today. At her prior baseline weight, down about 40 lbs total from peak -we spent significant time today again reviewing long term management of this -patient has expressed a desire to return to [place] (short term) but also possibly live independently again. I discussed that this is a family decision, but my concern would be consistent use of diuretics. Patient does not like how they make her urinate frequently, and self-dosing with reduced use likely contributed to severe edema and weight gain in the past. -I counseled that pHTN will be lifelong condition, and main treatment is volume management with diuretics -Continue bumex 1 mg daily and spironolactone 12.5 mg daily. -continue daily weights, leg elevation, compression stockings, salt avoidance -reviewed HF education again today  Hypertension: well controlled -continue diuretics as above -metoprolol as below for rate control -continue losartan 100 mg daily  Atrial fibrillation, permanent -CHA2DS2/VAS=at least 5 -on coumadin, managed per PCP.  -on metoprolol for rate control, 50 mg BID  Plan for follow up: 3 mos or sooner PRN  TIME SPENT WITH PATIENT: >40 minutes of direct patient care. More than 50% of that time was spent on coordination of care and counseling regarding diuretics, pHTN, etiology,  long term management.  [name], MD, PhD [place]  [facility]   Medication Adjustments/Labs and Tests Ordered: Current medicines are reviewed at length with the patient today.  Concerns regarding medicines are outlined above.  No orders of the defined types were placed in this encounter.  No orders of the defined types were placed in this encounter.   Patient Instructions  Medication Instructions:  Your Physician recommend you continue on your current medication as directed.    If you need a refill on your cardiac  medications before your next appointment, please call your pharmacy.   Lab work: None  Testing/Procedures: None  Follow-Up: At [facility], you and your health needs are our priority.  As part of our continuing mission to provide you with exceptional heart care, we have created designated Provider Care Teams.  These Care Teams include your primary Cardiologist (physician) and Advanced Practice Providers (APPs -  Physician Assistants and Nurse Practitioners) who all work together to provide you with the care you need, when you need it. You will need a follow up appointment in 3 months.  Please call our office 2 months in advance to schedule this appointment.  You may see Buford Dresser, MD or one of the following Advanced Practice Providers on your designated Care Team:   Rosaria Ferries, PA-C . Jory Sims, DNP, ANP       Signed, Buford Dresser, MD PhD 06/10/2019  [facility]

## 2019-06-12 ENCOUNTER — Encounter: Payer: Self-pay | Admitting: Cardiology

## 2019-07-04 ENCOUNTER — Ambulatory Visit: Payer: Medicare HMO | Admitting: Cardiology

## 2019-08-30 ENCOUNTER — Other Ambulatory Visit: Payer: Self-pay

## 2019-08-30 ENCOUNTER — Encounter: Payer: Self-pay | Admitting: Cardiology

## 2019-08-30 ENCOUNTER — Ambulatory Visit (INDEPENDENT_AMBULATORY_CARE_PROVIDER_SITE_OTHER): Payer: Medicare HMO | Admitting: Cardiology

## 2019-08-30 VITALS — BP 116/84 | HR 57 | Temp 94.1°F | Ht 62.0 in | Wt 192.2 lb

## 2019-08-30 DIAGNOSIS — Z7901 Long term (current) use of anticoagulants: Secondary | ICD-10-CM

## 2019-08-30 DIAGNOSIS — Z79899 Other long term (current) drug therapy: Secondary | ICD-10-CM

## 2019-08-30 DIAGNOSIS — R600 Localized edema: Secondary | ICD-10-CM | POA: Diagnosis not present

## 2019-08-30 DIAGNOSIS — I272 Pulmonary hypertension, unspecified: Secondary | ICD-10-CM | POA: Diagnosis not present

## 2019-08-30 DIAGNOSIS — I4821 Permanent atrial fibrillation: Secondary | ICD-10-CM

## 2019-08-30 DIAGNOSIS — I10 Essential (primary) hypertension: Secondary | ICD-10-CM

## 2019-08-30 DIAGNOSIS — I5032 Chronic diastolic (congestive) heart failure: Secondary | ICD-10-CM | POA: Diagnosis not present

## 2019-08-30 NOTE — Patient Instructions (Signed)
Medication Instructions:  Your Physician recommend you continue on your current medication as directed.    *If you need a refill on your cardiac medications before your next appointment, please call your pharmacy*  Lab Work: Your physician recommends that you return for lab work today (BMP)  If you have labs (blood work) drawn today and your tests are completely normal, you will receive your results only by: . MyChart Message (if you have MyChart) OR . A paper copy in the mail If you have any lab test that is abnormal or we need to change your treatment, we will call you to review the results.  Testing/Procedures: None  Follow-Up: At [facility], you and your health needs are our priority.  As part of our continuing mission to provide you with exceptional heart care, we have created designated Provider Care Teams.  These Care Teams include your primary Cardiologist (physician) and Advanced Practice Providers (APPs -  Physician Assistants and Nurse Practitioners) who all work together to provide you with the care you need, when you need it.  Your next appointment:   3 month(s)  The format for your next appointment:   In Person  Provider:   Bridgette Christopher, MD   

## 2019-08-30 NOTE — Progress Notes (Signed)
Cardiology Office Note:    Date:  08/30/2019   ID:  [name], DOB 1928-04-16, MRN [id number]  PCP:  Maurice Small, MD  Cardiologist:  Buford Dresser, MD PhD  Referring MD: Maurice Small, MD   CC: close follow up of LE edema  History of Present Illness:    [name] is a 85 y.o. female with a hx of atrial fibrillation on coumadin, essential hypertension, pulmonary hypertension, and chronic diastolic heart failure. Her initial consult (telemedicine) was 11/25/18.  Cardiac history: Previously followed by Dr. Tish Men at [facility] (notes under media tab). Per notes:  -echo 2014, lexiscan 2015 low risk,  -echo 2019 hyperdynamic EF, septal flattening, severe RV dilation, RVSP noted as critical (near systemic) pulmonary hypertension, severe biatrial enlargement, dilated coronary sinus ?persistent left SVC.  -Venous study 2019 with bilateral mild insufficiency -Echo 11/10/17 with hyperdynamic EF, septal flattening, severe RV dilation, severe TR, severe pulmonary hypertension (listed at 71 mmHg in note)  Today: Brings log of weights with her, very good range between 183-185 lbs. Has loosened diet some, occasional potato chips and coke, but overall doing very well. Able to travel to [place], stayed for about a week. No significant worsening of symptoms. We discussed long term management--she will need to stay on these medications, but as she has remained largely stable, ok to loosen diet occasionally for special events/treats. Reviewed heart failure education, weights again today. Doing very well with this.  Denies chest pain, shortness of breath at rest or with normal exertion. No PND, orthopnea or unexpected weight gain. No syncope or palpitations.  Past Medical History:  Diagnosis Date  . Anticoagulant long-term use   . Arrhythmia   . Arthritis   . Atrial fibrillation ([place])   . Congestive heart failure (CHF) ([place])   . Dysphagia   . Esophageal reflux   . Essential  hypertension   . Hiatal hernia   . Pulmonary embolus ([place])   . Thyroid disease     Past Surgical History:  Procedure Laterality Date  . COLONOSCOPY    . ELBOW SURGERY    . LAPAROSCOPIC HYSTERECTOMY      Current Medications: Current Outpatient Medications on File Prior to Visit  Medication Sig  . bumetanide (BUMEX) 1 MG tablet Take 1 tablet (1 mg total) by mouth daily.  . cholecalciferol (VITAMIN D3) 25 MCG (1000 UT) tablet Take 1,000 Units by mouth 2 (two) times daily.   [name] Kitchen gabapentin (NEURONTIN) 300 MG capsule Take 600 mg by mouth 2 (two) times daily.   [name] Kitchen levothyroxine (SYNTHROID) 112 MCG tablet Take 112 mcg by mouth daily before breakfast.   . losartan (COZAAR) 100 MG tablet Take 100 mg by mouth daily.  . metoprolol tartrate (LOPRESSOR) 50 MG tablet Take 50 mg by mouth 2 (two) times daily.  . Multiple Vitamins-Minerals (PRESERVISION/LUTEIN) CAPS Take 1 capsule by mouth 2 (two) times daily.   . mupirocin ointment (BACTROBAN) 2 % Place 1 application into the nose 2 (two) times daily.  . nortriptyline (PAMELOR) 10 MG capsule Take 10 mg by mouth at bedtime.  . pantoprazole (PROTONIX) 40 MG tablet Take 40 mg by mouth daily.  [name] Kitchen spironolactone (ALDACTONE) 25 MG tablet Take by mouth daily. Pt takes 12.5mg  daily  . warfarin (COUMADIN) 1 MG tablet Take 1 mg by mouth one time only at 6 PM. 2 tablets on Monday, and Thursday and Saturday and 1 tablet on remaining days.   No current facility-administered medications on file prior to  visit.     Allergies:   Acetaminophen, Amoxicillin, Codeine, Erythromycin, Iodine, Oxycodone, Oxycodone-acetaminophen, Penicillins, Sulfasalazine, and Tramadol   Social History   Tobacco Use  . Smoking status: Former Smoker    Packs/day: 1.00    Types: Cigarettes    Quit date: 10/02/2001    Years since quitting: 17.9  . Smokeless tobacco: Never Used  . Tobacco comment: former smoker  Substance Use Topics  . Alcohol use: Not on file  . Drug use: Not on file      Family History: The patient's family history includes Cancer in her brother; Stroke in her mother.  ROS:   Please see the history of present illness.  Additional pertinent ROS: Constitutional: Negative for chills, fever, night sweats, unintentional weight loss  HENT: Negative for ear pain and hearing loss.   Eyes: Negative for loss of vision and eye pain.  Respiratory: Negative for cough, sputum, wheezing.   Cardiovascular: See HPI. Gastrointestinal: Negative for abdominal pain, melena, and hematochezia.  Genitourinary: Negative for dysuria and hematuria.  Musculoskeletal: Negative for falls and myalgias.  Skin: Negative for itching and rash.  Neurological: Negative for focal weakness, focal sensory changes and loss of consciousness.  Endo/Heme/Allergies: Does not bruise/bleed easily.    EKGs/Labs/Other Studies Reviewed:    The following studies were reviewed today: See cardiac history, above  EKG:  EKG is personally reviewed.  The ekg ordered 03/02/19 demonstrates afib at 65 bpm  Recent Labs: 03/22/2019: BUN 27; Creatinine, Ser 1.46; Potassium 4.3; Sodium 136  Recent Lipid Panel No results found for: CHOL, TRIG, HDL, CHOLHDL, VLDL, LDLCALC, LDLDIRECT  Physical Exam:    VS:  BP 116/84   Pulse (!) 57   Temp (!) 94.1 F (34.5 C)   Ht 5\' 2"  (1.575 m)   Wt 192 lb 3.2 oz (87.2 kg)   BMI 35.15 kg/m     Wt Readings from Last 3 Encounters:  08/30/19 192 lb 3.2 oz (87.2 kg)  06/10/19 189 lb 3.2 oz (85.8 kg)  04/05/19 188 lb 9.6 oz (85.5 kg)    GEN: Well nourished, well developed in no acute distress HEENT: Normal, moist mucous membranes NECK: JVD sustained at clavicle with prominent TR pulse higher at 90 degrees CARDIAC: irregularly irregular rhythm, normal S1 and S2, no rubs or gallops. 2/6 SM VASCULAR: Radial and DP pulses 2+ bilaterally. No carotid bruits RESPIRATORY:  Clear to auscultation without rales, wheezing or rhonchi  ABDOMEN: Soft, non-tender,  non-distended MUSCULOSKELETAL:  Ambulates independently SKIN: Warm and dry, compression stockings in place with trace edema.  NEUROLOGIC:  Alert and oriented x 3. No focal neuro deficits noted. PSYCHIATRIC:  Normal affect   ASSESSMENT:    1. Medication management   2. Moderate to severe pulmonary hypertension (HCC)   3. Diastolic CHF, chronic (HCC)   4. Bilateral leg edema   5. Permanent atrial fibrillation (HCC)   6. Anticoagulant long-term use   7. Essential hypertension    PLAN:    Peripheral edema 2/2 severe pulmonary hypertension and chronic diastolic heart failure -weights have been stable. Patient's daughter is very diligent and taking excellent care of her -I counseled that pHTN will be lifelong condition, and main treatment is volume management with diuretics -Continue bumex and spironolactone as noted -continue daily weights, leg elevation, compression stockings, salt avoidance -reviewed HF education again today, very diligent about this -check renal function today  Hypertension: well controlled -continue diuretics as above -metoprolol as below for rate control -continue losartan 100 mg daily  Atrial fibrillation, permanent -CHA2DS2/VAS=at least 5 -on coumadin, managed per PCP.  -on metoprolol for rate control, 50 mg BID  Plan for follow up: 3 mos or sooner PRN. Discussed alternating virtual and in person, but they prefer that she is seen in person.  TIME SPENT WITH PATIENT: 25 minutes of direct patient care. More than 50% of that time was spent on coordination of care and counseling regarding heart failure/pulmonary hypertension long term management.  Jodelle RedBridgette Syncere Kaminski, MD, PhD [place]  [facility]   Medication Adjustments/Labs and Tests Ordered: Current medicines are reviewed at length with the patient today.  Concerns regarding medicines are outlined above.  Orders Placed This Encounter  Procedures  . Basic metabolic panel   No orders of  the defined types were placed in this encounter.   Patient Instructions  Medication Instructions:  Your Physician recommend you continue on your current medication as directed.    *If you need a refill on your cardiac medications before your next appointment, please call your pharmacy*  Lab Work: Your physician recommends that you return for lab work today (BMP)  If you have labs (blood work) drawn today and your tests are completely normal, you will receive your results only by: [name] Kitchen. MyChart Message (if you have MyChart) OR . A paper copy in the mail If you have any lab test that is abnormal or we need to change your treatment, we will call you to review the results.  Testing/Procedures: None  Follow-Up: At [facility], you and your health needs are our priority.  As part of our continuing mission to provide you with exceptional heart care, we have created designated Provider Care Teams.  These Care Teams include your primary Cardiologist (physician) and Advanced Practice Providers (APPs -  Physician Assistants and Nurse Practitioners) who all work together to provide you with the care you need, when you need it.  Your next appointment:   3 month(s)  The format for your next appointment:   In Person  Provider:   Jodelle RedBridgette Ramah Langhans, MD      Signed, Jodelle RedBridgette Tegh Franek, MD PhD 08/30/2019  [facility]

## 2019-08-31 LAB — BASIC METABOLIC PANEL
BUN/Creatinine Ratio: 19 (ref 12–28)
BUN: 28 mg/dL — ABNORMAL HIGH (ref 8–27)
CO2: 22 mmol/L (ref 20–29)
Calcium: 9.8 mg/dL (ref 8.7–10.3)
Chloride: 95 mmol/L — ABNORMAL LOW (ref 96–106)
Creatinine, Ser: 1.46 mg/dL — ABNORMAL HIGH (ref 0.57–1.00)
GFR calc Af Amer: 37 mL/min/{1.73_m2} — ABNORMAL LOW (ref >59)
GFR calc non Af Amer: 32 mL/min/{1.73_m2} — ABNORMAL LOW (ref >59)
Glucose: 92 mg/dL (ref 65–99)
Potassium: 4.9 mmol/L (ref 3.5–5.2)
Sodium: 135 mmol/L (ref 134–144)

## 2019-09-01 ENCOUNTER — Encounter: Payer: Self-pay | Admitting: Cardiology

## 2019-10-14 ENCOUNTER — Other Ambulatory Visit: Payer: Self-pay | Admitting: General Practice

## 2019-11-08 ENCOUNTER — Other Ambulatory Visit: Payer: Self-pay

## 2019-11-08 ENCOUNTER — Ambulatory Visit (INDEPENDENT_AMBULATORY_CARE_PROVIDER_SITE_OTHER): Payer: Medicare HMO | Admitting: Cardiology

## 2019-11-08 ENCOUNTER — Encounter: Payer: Self-pay | Admitting: Cardiology

## 2019-11-08 VITALS — BP 132/62 | HR 45 | Ht 63.0 in | Wt 183.0 lb

## 2019-11-08 DIAGNOSIS — I4821 Permanent atrial fibrillation: Secondary | ICD-10-CM

## 2019-11-08 DIAGNOSIS — I10 Essential (primary) hypertension: Secondary | ICD-10-CM

## 2019-11-08 DIAGNOSIS — I272 Pulmonary hypertension, unspecified: Secondary | ICD-10-CM

## 2019-11-08 DIAGNOSIS — R600 Localized edema: Secondary | ICD-10-CM

## 2019-11-08 DIAGNOSIS — Z7901 Long term (current) use of anticoagulants: Secondary | ICD-10-CM

## 2019-11-08 DIAGNOSIS — I5032 Chronic diastolic (congestive) heart failure: Secondary | ICD-10-CM

## 2019-11-08 MED ORDER — METOPROLOL SUCCINATE ER 50 MG PO TB24: 50.0000 mg | ORAL_TABLET | Freq: Every day | ORAL | 3 refills | Status: DC
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
## 2019-11-08 NOTE — Patient Instructions (Signed)
Medication Instructions:  Stop: Metoprolol Tartrate 50 mg twice a day Start: Metoprolol Succinate 50 mg daily  *If you need a refill on your cardiac medications before your next appointment, please call your pharmacy*   Lab Work: None  Testing/Procedures: None   Follow-Up: At [facility], you and your health needs are our priority.  As part of our continuing mission to provide you with exceptional heart care, we have created designated Provider Care Teams.  These Care Teams include your primary Cardiologist (physician) and Advanced Practice Providers (APPs -  Physician Assistants and Nurse Practitioners) who all work together to provide you with the care you need, when you need it.  We recommend signing up for the patient portal called "MyChart".  Sign up information is provided on this After Visit Summary.  MyChart is used to connect with patients for Virtual Visits (Telemedicine).  Patients are able to view lab/test results, encounter notes, upcoming appointments, etc.  Non-urgent messages can be sent to your provider as well.   To learn more about what you can do with MyChart, go to [email or web address].    Your next appointment:   3-4 month(s)  The format for your next appointment:   In Person  Provider:   [name], MD

## 2020-02-09 ENCOUNTER — Other Ambulatory Visit: Payer: Self-pay

## 2020-02-09 ENCOUNTER — Ambulatory Visit (INDEPENDENT_AMBULATORY_CARE_PROVIDER_SITE_OTHER): Payer: Medicare HMO | Admitting: Cardiology

## 2020-02-09 VITALS — BP 121/61 | HR 53 | Wt 186.6 lb

## 2020-02-09 DIAGNOSIS — R600 Localized edema: Secondary | ICD-10-CM | POA: Diagnosis not present

## 2020-02-09 DIAGNOSIS — I4821 Permanent atrial fibrillation: Secondary | ICD-10-CM | POA: Diagnosis not present

## 2020-02-09 DIAGNOSIS — I5032 Chronic diastolic (congestive) heart failure: Secondary | ICD-10-CM

## 2020-02-09 DIAGNOSIS — Z7901 Long term (current) use of anticoagulants: Secondary | ICD-10-CM | POA: Diagnosis not present

## 2020-02-09 DIAGNOSIS — I272 Pulmonary hypertension, unspecified: Secondary | ICD-10-CM

## 2020-02-09 DIAGNOSIS — I10 Essential (primary) hypertension: Secondary | ICD-10-CM

## 2020-02-09 NOTE — Progress Notes (Signed)
Cardiology Office Note:    Date:  02/09/2020   ID:  ADAMARIZ [name], DOB 02/19/1928, MRN [id number]  PCP:  Maurice Small, MD  Cardiologist:  Buford Dresser, MD PhD  Referring MD: Maurice Small, MD   CC: follow up  History of Present Illness:    [name] is a 86 y.o. female with a hx of atrial fibrillation on coumadin, essential hypertension, pulmonary hypertension, and chronic diastolic heart failure. Her initial consult (telemedicine) was 11/25/18.  Cardiac history: Previously followed by Dr. Tish Men at [facility] (notes under media tab). Per notes:  -echo 2014, lexiscan 2015 low risk,  -echo 2019 hyperdynamic EF, septal flattening, severe RV dilation, RVSP noted as critical (near systemic) pulmonary hypertension, severe biatrial enlargement, dilated coronary sinus ?persistent left SVC.  -Venous study 2019 with bilateral mild insufficiency -Echo 11/10/17 with hyperdynamic EF, septal flattening, severe RV dilation, severe TR, severe pulmonary hypertension (listed at 71 mmHg in note)  Today: Doing very well. Weights doing well, 183 lb at home. Swelling well controlled. Trip to [place] went well. Even with travel, eating more salt, intermittent lasix she did very well.   BP is a little low for her, but she is asymptomatic. Is on a muscle relaxer for hip and leg pain.  Denies chest pain, shortness of breath at rest or with normal exertion. No PND, orthopnea, LE edema or unexpected weight gain. No syncope or palpitations.  Past Medical History:  Diagnosis Date  . Anticoagulant long-term use   . Arrhythmia   . Arthritis   . Atrial fibrillation ([place])   . Congestive heart failure (CHF) ([place])   . Dysphagia   . Esophageal reflux   . Essential hypertension   . Hiatal hernia   . Pulmonary embolus ([place])   . Thyroid disease     Past Surgical History:  Procedure Laterality Date  . COLONOSCOPY    . ELBOW SURGERY    . LAPAROSCOPIC HYSTERECTOMY      Current  Medications: Current Outpatient Medications on File Prior to Visit  Medication Sig  . bumetanide (BUMEX) 1 MG tablet Take 1 tablet (1 mg total) by mouth daily.  . cholecalciferol (VITAMIN D3) 25 MCG (1000 UT) tablet Take 1,000 Units by mouth 2 (two) times daily.   [name] Kitchen gabapentin (NEURONTIN) 300 MG capsule Take 600 mg by mouth 2 (two) times daily.   [name] Kitchen levothyroxine (SYNTHROID) 112 MCG tablet Take 112 mcg by mouth daily before breakfast.   . losartan (COZAAR) 100 MG tablet Take 100 mg by mouth daily.  . methocarbamol (ROBAXIN) 500 MG tablet Take 500 mg by mouth 2 (two) times daily as needed.  . metoprolol succinate (TOPROL-XL) 50 MG 24 hr tablet Take 1 tablet (50 mg total) by mouth daily. Take with or immediately following a meal.  . Multiple Vitamins-Minerals (PRESERVISION/LUTEIN) CAPS Take 1 capsule by mouth 2 (two) times daily.   . pantoprazole (PROTONIX) 40 MG tablet Take 40 mg by mouth daily.  [name] Kitchen spironolactone (ALDACTONE) 25 MG tablet TAKE 1 TABLET (25 MG TOTAL) BY MOUTH DAILY. EVERY MORNING WITH BREAKFAST.  [name] Kitchen warfarin (COUMADIN) 1 MG tablet Take 1 mg by mouth one time only at 6 PM. 2 tablets on Monday, and Thursday and Saturday and 1 tablet on remaining days.   No current facility-administered medications on file prior to visit.     Allergies:   Acetaminophen, Amoxicillin, Codeine, Erythromycin, Iodine, Oxycodone, Oxycodone-acetaminophen, Penicillins, Sulfasalazine, and Tramadol   Social History   Tobacco Use  .  Smoking status: Former Smoker    Packs/day: 1.00    Types: Cigarettes    Quit date: 10/02/2001    Years since quitting: 18.3  . Smokeless tobacco: Never Used  . Tobacco comment: former smoker  Substance Use Topics  . Alcohol use: Not on file  . Drug use: Not on file     Family History: The patient's family history includes Cancer in her brother; Stroke in her mother.  ROS:   Please see the history of present illness.  Additional pertinent ROS otherwise  unremarkable.  EKGs/Labs/Other Studies Reviewed:    The following studies were reviewed today: See cardiac history, above  EKG:  EKG is personally reviewed.  The ekg ordered 01/05/20 demonstrates afib at 45 bpm  Recent Labs: 08/30/2019: BUN 28; Creatinine, Ser 1.46; Potassium 4.9; Sodium 135  Recent Lipid Panel No results found for: CHOL, TRIG, HDL, CHOLHDL, VLDL, LDLCALC, LDLDIRECT  Physical Exam:    VS:  BP 121/61   Pulse (!) 53   SpO2 98%     Wt Readings from Last 3 Encounters:  11/08/19 183 lb (83 kg)  08/30/19 192 lb 3.2 oz (87.2 kg)  06/10/19 189 lb 3.2 oz (85.8 kg)   GEN: Well nourished, well developed in no acute distress HEENT: Normal, moist mucous membranes NECK: No sustained JVD but prominent TR pulsations CARDIAC: irregularly irregular rhythm, normal S1 and S2, no rubs or gallops. 2/6 systolic murmur. VASCULAR: Radial and DP pulses 2+ bilaterally. No carotid bruits RESPIRATORY:  Clear to auscultation without rales, wheezing or rhonchi  ABDOMEN: Soft, non-tender, non-distended MUSCULOSKELETAL:  Moves all 4 limbs independently SKIN: Warm and dry, trace bilateral LE edema NEUROLOGIC:  Alert and oriented x 3. No focal neuro deficits noted. PSYCHIATRIC:  Normal affect   ASSESSMENT:    1. Diastolic CHF, chronic (HCC)   2. Permanent atrial fibrillation (HCC)   3. Anticoagulant long-term use   4. Bilateral leg edema   5. Essential hypertension   6. Moderate to severe pulmonary hypertension (HCC)    PLAN:    Peripheral edema 2/2 severe pulmonary hypertension and chronic diastolic heart failure -has stabilized significantly. Daughter very involved in her care. Weights and symptoms stable. -Continue bumex and spironolactone as noted -continue daily weights, leg elevation, compression stockings, salt avoidance -we have extensively discussed her pulmonary hypertension, see prior notes  Hypertension: at goal -continue diuretics as above -metoprolol succinate as  below for rate control -continue losartan 100 mg daily  Atrial fibrillation, permanent -CHA2DS2/VAS=at least 5 -on coumadin, managed per PCP.  -tolerating metoprolol succinate 50 mg daily, monitor  Plan for follow up: 6 mos or sooner as needed  [name], MD, PhD Accident  [facility]   Medication Adjustments/Labs and Tests Ordered: Current medicines are reviewed at length with the patient today.  Concerns regarding medicines are outlined above.  No orders of the defined types were placed in this encounter.  No orders of the defined types were placed in this encounter.   Patient Instructions  Medication Instructions:  Your Physician recommend you continue on your current medication as directed.    *If you need a refill on your cardiac medications before your next appointment, please call your pharmacy*   Lab Work: None   Testing/Procedures: None   Follow-Up: At [facility], you and your health needs are our priority.  As part of our continuing mission to provide you with exceptional heart care, we have created designated Provider Care Teams.  These Care Teams include  your primary Cardiologist (physician) and Advanced Practice Providers (APPs -  Physician Assistants and Nurse Practitioners) who all work together to provide you with the care you need, when you need it.  We recommend signing up for the patient portal called "MyChart".  Sign up information is provided on this After Visit Summary.  MyChart is used to connect with patients for Virtual Visits (Telemedicine).  Patients are able to view lab/test results, encounter notes, upcoming appointments, etc.  Non-urgent messages can be sent to your provider as well.   To learn more about what you can do with MyChart, go to [email or web address].    Your next appointment:   6 month(s)  The format for your next appointment:   In Person  Provider:   [name], MD        Signed, [name], MD PhD 02/09/2020  [facility]

## 2020-02-09 NOTE — Patient Instructions (Signed)

## 2020-02-11 ENCOUNTER — Other Ambulatory Visit: Payer: Self-pay | Admitting: Cardiology

## 2020-02-13 NOTE — Telephone Encounter (Signed)
Rx request sent to pharmacy.  

## 2020-04-09 NOTE — Progress Notes (Addendum)
[facility] Note  04/10/2020     CHIEF COMPLAINT Patient presents for Retina Evaluation   HISTORY OF PRESENT ILLNESS: [name] is a 86 y.o. female who presents to the clinic today for:   HPI    Retina Evaluation    In both eyes.  This started 1 month ago.  Duration of 1 month.  Context:  distance vision and near vision.  I, the attending physician,  performed the HPI with the patient and updated documentation appropriately.          Comments    Hx of CE/IOL OU in [place]--unknown date Pt states she has noticed a decrease in her vision in her right eye mostly but states she feels like her vision is "ok".  Denies eye pain or discomfort.  Patient denies any new or worsening floaters or fol OU.       Last edited by Bernarda Caffey, MD on 04/10/2020  2:19 PM. (History)    pt is here on the referral of Dr. Frederico Hamman for concern for retinal evaluation, pt has been living with her daughter for 2 years and has seen Dr. Frederico Hamman twice, pt states she has a hx of dry macular degeneration, she has been told to take AREDS 2, pt denies being diabetic, pt denies using any gtts  Referring physician: Gevena Cotton, MD [place],  [place] [id number]  HISTORICAL INFORMATION:   Selected notes from the MEDICAL RECORD NUMBER Referred by Dr. Frederico Hamman for ret eval OU   CURRENT MEDICATIONS: No current outpatient medications on file. (Ophthalmic Drugs)   No current facility-administered medications for this visit. (Ophthalmic Drugs)   Current Outpatient Medications (Other)  Medication Sig  . bumetanide (BUMEX) 1 MG tablet Take 1 tablet (1 mg total) by mouth daily.  . cholecalciferol (VITAMIN D3) 25 MCG (1000 UT) tablet Take 1,000 Units by mouth 2 (two) times daily.   [name] Kitchen gabapentin (NEURONTIN) 300 MG capsule Take 600 mg by mouth 2 (two) times daily.   [name] Kitchen levothyroxine (SYNTHROID) 112 MCG tablet Take 112 mcg by mouth daily before breakfast.   .  losartan (COZAAR) 100 MG tablet Take 100 mg by mouth daily.  . methocarbamol (ROBAXIN) 500 MG tablet Take 500 mg by mouth 2 (two) times daily as needed.  . metoprolol succinate (TOPROL-XL) 50 MG 24 hr tablet Take 1 tablet (50 mg total) by mouth daily. Take with or immediately following a meal.  . Multiple Vitamins-Minerals (PRESERVISION/LUTEIN) CAPS Take 1 capsule by mouth 2 (two) times daily.   . pantoprazole (PROTONIX) 40 MG tablet Take 40 mg by mouth daily.  [name] Kitchen spironolactone (ALDACTONE) 25 MG tablet TAKE 1 TABLET BY MOUTH DAILY. EVERY MORNING WITH BREAKFAST  . warfarin (COUMADIN) 1 MG tablet Take 1 mg by mouth one time only at 6 PM. 2 tablets on Monday, and Thursday and Saturday and 1 tablet on remaining days.   No current facility-administered medications for this visit. (Other)      REVIEW OF SYSTEMS: ROS    Positive for: Eyes   Negative for: Constitutional, Gastrointestinal, Neurological, Skin, Genitourinary, Musculoskeletal, HENT, Endocrine, Cardiovascular, Respiratory, Psychiatric, Allergic/Imm, Heme/Lymph   Last edited by Doneen Poisson on 04/10/2020  1:27 PM. (History)       ALLERGIES Allergies  Allergen Reactions  . Acetaminophen   . Amoxicillin   . Codeine   . Erythromycin   . Iodine   . Oxycodone   . Oxycodone-Acetaminophen   .  Penicillins   . Sulfasalazine   . Tramadol     PAST MEDICAL HISTORY Past Medical History:  Diagnosis Date  . Anticoagulant long-term use   . Arrhythmia   . Arthritis   . Atrial fibrillation ([place])   . Congestive heart failure (CHF) ([place])   . Dysphagia   . Esophageal reflux   . Essential hypertension   . Hiatal hernia   . Pulmonary embolus ([place])   . Thyroid disease    Past Surgical History:  Procedure Laterality Date  . COLONOSCOPY    . ELBOW SURGERY    . LAPAROSCOPIC HYSTERECTOMY      FAMILY HISTORY Family History  Problem Relation Age of Onset  . Stroke Mother   . Cancer Brother     SOCIAL HISTORY Social History    Tobacco Use  . Smoking status: Former Smoker    Packs/day: 1.00    Types: Cigarettes    Quit date: 10/02/2001    Years since quitting: 18.5  . Smokeless tobacco: Never Used  . Tobacco comment: former smoker  Substance Use Topics  . Alcohol use: Not on file  . Drug use: Not on file         OPHTHALMIC EXAM:  Base Eye Exam    Visual Acuity (Snellen - Linear)      Right Left   Dist cc 20/150 +1 20/40 -2   Dist ph cc 20/100 +1 20/40 +2   Correction: Glasses       Tonometry (Tonopen, 1:59 PM)      Right Left   Pressure 15 15       Pupils      Dark Light Shape React APD   Right 3 2 Round Brisk 0   Left 3 2 Round Brisk 0       Visual Fields      Left Right    Full Full       Extraocular Movement      Right Left    Full Full       Neuro/Psych    Oriented x3: Yes   Mood/Affect: Normal       Dilation    Both eyes: 1.0% Mydriacyl, 2.5% Phenylephrine @ 2:00 PM        Slit Lamp and Fundus Exam    Slit Lamp Exam      Right Left   Lids/Lashes Dermatochalasis - upper lid, mild Meibomian gland dysfunction Dermatochalasis - upper lid, mild Meibomian gland dysfunction   Conjunctiva/Sclera White and quiet White and quiet   Cornea 2-3+ Punctate epithelial erosions, irregular tear film, decreased TBUT arcus, 2-3+ Punctate epithelial erosions, irregular tear film, decreased TBUT   Anterior Chamber Deep and clear, narrow temporal angle Deep and clear, narrow temporal angle   Iris Round and dilated Round and dilated   Lens PC IOL in good position, 2-3+ Posterior capsular opacification PC IOL in good position, 1-2+ Posterior capsular opacification   Vitreous Vitreous syneresis Vitreous syneresis       Fundus Exam      Right Left   Disc Pink and Sharp, 360PPA mild Pallor, Sharp rim   C/D Ratio 0.3 0.3   Macula Flat, Blunted foveal reflex, RPE mottling, clumping and atrophy, Drusen, No heme or edema Flat, Blunted foveal reflex, RPE mottling, clumping and atrophy, +GA,  No heme or edema   Vessels Vascular attenuation, mild tortuousity Vascular attenuation, Tortuous   Periphery Attached, reticular degeneration, No heme  Attached, mild reticular degeneration, No heme  Refraction    Wearing Rx      Sphere Cylinder Axis   Right -1.50 +1.50 005   Left -0.25 +1.50 175       Manifest Refraction      Sphere Cylinder Axis Dist VA   Right -1.25 +1.25 005 20/150+2   Left -0.50 +1.50 175 20/40+2          IMAGING AND PROCEDURES  Imaging and Procedures for 04/10/2020  OCT, Retina - OU - Both Eyes       Right Eye Quality was good. Central Foveal Thickness: 241. Progression has no prior data. Findings include normal foveal contour, no IRF, no SRF, retinal drusen , intraretinal hyper-reflective material, outer retinal atrophy.   Left Eye Quality was good. Central Foveal Thickness: 281. Progression has no prior data. Findings include normal foveal contour, no IRF, no SRF, retinal drusen , outer retinal atrophy, intraretinal hyper-reflective material.   Notes *Images captured and stored on drive  Diagnosis / Impression:  Nonexudative ARMD OU Focal peripapillary ORA OU No IRF/SRF OU  Clinical management:  See below  Abbreviations: NFP - Normal foveal profile. CME - cystoid macular edema. PED - pigment epithelial detachment. IRF - intraretinal fluid. SRF - subretinal fluid. EZ - ellipsoid zone. ERM - epiretinal membrane. ORA - outer retinal atrophy. ORT - outer retinal tubulation. SRHM - subretinal hyper-reflective material. IRHM - intraretinal hyper-reflective material                 ASSESSMENT/PLAN:    ICD-10-CM   1. Advanced atrophic nonexudative age-related macular degeneration of both eyes without subfoveal involvement  H35.3133   2. Retinal edema  H35.81 OCT, Retina - OU - Both Eyes  3. Essential hypertension  I10   4. Hypertensive retinopathy of both eyes  H35.033   5. Pseudophakia, both eyes  Z96.1   6. PCO (posterior  capsular opacification), bilateral  H26.493   7. Dry eyes  H04.123     1,2. Age related macular degeneration, non-exudative, with +GA, both eyes (OS>OD)  - The incidence, anatomy, and pathology of dry AMD, risk of progression, and the AREDS and AREDS 2 study including smoking risks discussed with patient.  - Recommend amsler grid monitoring  - f/u 3-4 months, DFE, OCT  3,4. Hypertensive retinopathy OU - discussed importance of tight BP control - monitor  5. Pseudophakia OU  - s/p CE/IOL OU  - IOLs in good position - monitor  6. PCO OU (OD > OS)  - PCO OD likely contributing to decreased VA  - clear from a retina standpoint to proceed with YAG capsulotomy  7. Dry eyes OU  - recommend artificial tears and lubricating ointment as needed  Ophthalmic Meds Ordered this visit:  No orders of the defined types were placed in this encounter.      Return for 3-4 mos - f/u non-exu ARMD OU, DFE, OCT.  There are no Patient Instructions on file for this visit.   Explained the diagnoses, plan, and follow up with the patient and they expressed understanding.  Patient expressed understanding of the importance of proper follow up care.  This document serves as a record of services personally performed by Gardiner Sleeper, MD, PhD. It was created on their behalf by Estill Bakes, COT an ophthalmic technician. The creation of this record is the provider's dictation and/or activities during the visit.    Electronically signed by: Estill Bakes, COT 8.2.21 @ 4:53 PM   Gardiner Sleeper, M.D., Ph.D. Diseases &  Surgery of the Retina and Vitreous [facility]  I have reviewed the above documentation for accuracy and completeness, and I agree with the above. Gardiner Sleeper, M.D., Ph.D. 04/10/20 4:53 PM    Abbreviations: M myopia (nearsighted); A astigmatism; H hyperopia (farsighted); P presbyopia; Mrx spectacle prescription;  CTL contact lenses; OD right eye; OS left eye;  OU both eyes  XT exotropia; ET esotropia; PEK punctate epithelial keratitis; PEE punctate epithelial erosions; DES dry eye syndrome; MGD meibomian gland dysfunction; ATs artificial tears; PFAT's preservative free artificial tears; [place] nuclear sclerotic cataract; PSC posterior subcapsular cataract; ERM epi-retinal membrane; PVD posterior vitreous detachment; RD retinal detachment; DM diabetes mellitus; DR diabetic retinopathy; NPDR non-proliferative diabetic retinopathy; PDR proliferative diabetic retinopathy; CSME clinically significant macular edema; DME diabetic macular edema; dbh dot blot hemorrhages; CWS cotton wool spot; POAG primary open angle glaucoma; C/D cup-to-disc ratio; HVF humphrey visual field; GVF goldmann visual field; OCT optical coherence tomography; IOP intraocular pressure; BRVO Branch retinal vein occlusion; CRVO central retinal vein occlusion; CRAO central retinal artery occlusion; BRAO branch retinal artery occlusion; RT retinal tear; SB scleral buckle; PPV pars plana vitrectomy; VH Vitreous hemorrhage; PRP panretinal laser photocoagulation; IVK intravitreal kenalog; VMT vitreomacular traction; MH Macular hole;  NVD neovascularization of the disc; NVE neovascularization elsewhere; AREDS age related eye disease study; ARMD age related macular degeneration; POAG primary open angle glaucoma; EBMD epithelial/anterior basement membrane dystrophy; ACIOL anterior chamber intraocular lens; IOL intraocular lens; PCIOL posterior chamber intraocular lens; Phaco/IOL phacoemulsification with intraocular lens placement; [place] photorefractive keratectomy; LASIK laser assisted in situ keratomileusis; HTN hypertension; DM diabetes mellitus; COPD chronic obstructive pulmonary disease

## 2020-04-10 ENCOUNTER — Encounter (INDEPENDENT_AMBULATORY_CARE_PROVIDER_SITE_OTHER): Payer: Self-pay | Admitting: Ophthalmology

## 2020-04-10 ENCOUNTER — Other Ambulatory Visit: Payer: Self-pay

## 2020-04-10 ENCOUNTER — Ambulatory Visit (INDEPENDENT_AMBULATORY_CARE_PROVIDER_SITE_OTHER): Payer: Medicare HMO | Admitting: Ophthalmology

## 2020-04-10 DIAGNOSIS — H35033 Hypertensive retinopathy, bilateral: Secondary | ICD-10-CM | POA: Diagnosis not present

## 2020-04-10 DIAGNOSIS — H353133 Nonexudative age-related macular degeneration, bilateral, advanced atrophic without subfoveal involvement: Secondary | ICD-10-CM | POA: Diagnosis not present

## 2020-04-10 DIAGNOSIS — H3581 Retinal edema: Secondary | ICD-10-CM | POA: Diagnosis not present

## 2020-04-10 DIAGNOSIS — H04123 Dry eye syndrome of bilateral lacrimal glands: Secondary | ICD-10-CM

## 2020-04-10 DIAGNOSIS — Z961 Presence of intraocular lens: Secondary | ICD-10-CM

## 2020-04-10 DIAGNOSIS — H26493 Other secondary cataract, bilateral: Secondary | ICD-10-CM

## 2020-04-10 DIAGNOSIS — I10 Essential (primary) hypertension: Secondary | ICD-10-CM | POA: Diagnosis not present

## 2020-04-16 ENCOUNTER — Encounter: Payer: Self-pay | Admitting: Cardiology

## 2020-04-29 ENCOUNTER — Other Ambulatory Visit: Payer: Self-pay | Admitting: Cardiology

## 2020-04-29 DIAGNOSIS — I5033 Acute on chronic diastolic (congestive) heart failure: Secondary | ICD-10-CM

## 2020-06-14 ENCOUNTER — Other Ambulatory Visit: Payer: Self-pay | Admitting: Family Medicine

## 2020-06-15 ENCOUNTER — Other Ambulatory Visit: Payer: Self-pay | Admitting: Family Medicine

## 2020-06-15 DIAGNOSIS — M7989 Other specified soft tissue disorders: Secondary | ICD-10-CM

## 2020-06-22 ENCOUNTER — Ambulatory Visit
Admission: RE | Admit: 2020-06-22 | Discharge: 2020-06-22 | Disposition: A | Discharge status: Home / Self Care | Payer: Medicare HMO | Source: Ambulatory Visit | Attending: Family Medicine | Admitting: Family Medicine

## 2020-06-22 DIAGNOSIS — M7989 Other specified soft tissue disorders: Secondary | ICD-10-CM

## 2020-08-02 ENCOUNTER — Emergency Department (HOSPITAL_COMMUNITY): Payer: Medicare HMO

## 2020-08-02 ENCOUNTER — Encounter (HOSPITAL_COMMUNITY): Payer: Self-pay | Admitting: Radiology

## 2020-08-02 ENCOUNTER — Emergency Department (HOSPITAL_COMMUNITY)
Admission: EM | Admit: 2020-08-02 | Discharge: 2020-08-02 | Disposition: A | Discharge status: Home / Self Care | Payer: Medicare HMO | Attending: Emergency Medicine | Admitting: Emergency Medicine

## 2020-08-02 ENCOUNTER — Other Ambulatory Visit: Payer: Self-pay

## 2020-08-02 DIAGNOSIS — I4891 Unspecified atrial fibrillation: Secondary | ICD-10-CM | POA: Diagnosis not present

## 2020-08-02 DIAGNOSIS — M79662 Pain in left lower leg: Secondary | ICD-10-CM | POA: Insufficient documentation

## 2020-08-02 DIAGNOSIS — I110 Hypertensive heart disease with heart failure: Secondary | ICD-10-CM | POA: Diagnosis not present

## 2020-08-02 DIAGNOSIS — M79661 Pain in right lower leg: Secondary | ICD-10-CM | POA: Insufficient documentation

## 2020-08-02 DIAGNOSIS — Z87891 Personal history of nicotine dependence: Secondary | ICD-10-CM | POA: Diagnosis not present

## 2020-08-02 DIAGNOSIS — Z7901 Long term (current) use of anticoagulants: Secondary | ICD-10-CM | POA: Insufficient documentation

## 2020-08-02 DIAGNOSIS — R030 Elevated blood-pressure reading, without diagnosis of hypertension: Secondary | ICD-10-CM

## 2020-08-02 DIAGNOSIS — K219 Gastro-esophageal reflux disease without esophagitis: Secondary | ICD-10-CM | POA: Diagnosis not present

## 2020-08-02 DIAGNOSIS — R748 Abnormal levels of other serum enzymes: Secondary | ICD-10-CM | POA: Diagnosis not present

## 2020-08-02 DIAGNOSIS — R1011 Right upper quadrant pain: Secondary | ICD-10-CM

## 2020-08-02 DIAGNOSIS — I5030 Unspecified diastolic (congestive) heart failure: Secondary | ICD-10-CM | POA: Diagnosis not present

## 2020-08-02 DIAGNOSIS — Z86711 Personal history of pulmonary embolism: Secondary | ICD-10-CM | POA: Diagnosis not present

## 2020-08-02 DIAGNOSIS — N3000 Acute cystitis without hematuria: Secondary | ICD-10-CM | POA: Diagnosis not present

## 2020-08-02 DIAGNOSIS — Z20822 Contact with and (suspected) exposure to covid-19: Secondary | ICD-10-CM | POA: Diagnosis not present

## 2020-08-02 DIAGNOSIS — Z79899 Other long term (current) drug therapy: Secondary | ICD-10-CM | POA: Diagnosis not present

## 2020-08-02 DIAGNOSIS — R0781 Pleurodynia: Secondary | ICD-10-CM | POA: Diagnosis not present

## 2020-08-02 LAB — CBC WITH DIFFERENTIAL/PLATELET
Abs Immature Granulocytes: 0.02 10*3/uL (ref 0.00–0.07)
Basophils Absolute: 0.1 10*3/uL (ref 0.0–0.1)
Basophils Relative: 1 %
Eosinophils Absolute: 0.3 10*3/uL (ref 0.0–0.5)
Eosinophils Relative: 3 %
HCT: 39.9 % (ref 36.0–46.0)
Hemoglobin: 12.8 g/dL (ref 12.0–15.0)
Immature Granulocytes: 0 %
Lymphocytes Relative: 14 %
Lymphs Abs: 1.1 10*3/uL (ref 0.7–4.0)
MCH: 31.2 pg (ref 26.0–34.0)
MCHC: 32.1 g/dL (ref 30.0–36.0)
MCV: 97.3 fL (ref 80.0–100.0)
Monocytes Absolute: 0.9 10*3/uL (ref 0.1–1.0)
Monocytes Relative: 11 %
Neutro Abs: 5.4 10*3/uL (ref 1.7–7.7)
Neutrophils Relative %: 71 %
Platelets: 221 10*3/uL (ref 150–400)
RBC: 4.1 MIL/uL (ref 3.87–5.11)
RDW: 13.7 % (ref 11.5–15.5)
WBC: 7.7 10*3/uL (ref 4.0–10.5)
nRBC: 0 % (ref 0.0–0.2)

## 2020-08-02 LAB — COMPREHENSIVE METABOLIC PANEL
ALT: 13 U/L (ref 0–44)
AST: 25 U/L (ref 15–41)
Albumin: 3.7 g/dL (ref 3.5–5.0)
Alkaline Phosphatase: 156 U/L — ABNORMAL HIGH (ref 38–126)
Anion gap: 12 (ref 5–15)
BUN: 23 mg/dL (ref 8–23)
CO2: 25 mmol/L (ref 22–32)
Calcium: 9.7 mg/dL (ref 8.9–10.3)
Chloride: 97 mmol/L — ABNORMAL LOW (ref 98–111)
Creatinine, Ser: 1.33 mg/dL — ABNORMAL HIGH (ref 0.44–1.00)
GFR, Estimated: 38 mL/min — ABNORMAL LOW (ref >60)
Glucose, Bld: 103 mg/dL — ABNORMAL HIGH (ref 70–99)
Potassium: 4.5 mmol/L (ref 3.5–5.1)
Sodium: 134 mmol/L — ABNORMAL LOW (ref 135–145)
Total Bilirubin: 1.2 mg/dL (ref 0.3–1.2)
Total Protein: 7.5 g/dL (ref 6.5–8.1)

## 2020-08-02 LAB — URINALYSIS, ROUTINE W REFLEX MICROSCOPIC
Bilirubin Urine: NEGATIVE
Glucose, UA: NEGATIVE mg/dL
Hgb urine dipstick: NEGATIVE
Ketones, ur: NEGATIVE mg/dL
Nitrite: POSITIVE — AB
Protein, ur: NEGATIVE mg/dL
Specific Gravity, Urine: 1.014 (ref 1.005–1.030)
pH: 6 (ref 5.0–8.0)

## 2020-08-02 LAB — RESP PANEL BY RT-PCR (FLU A&B, COVID) ARPGX2
Influenza A by PCR: NEGATIVE
Influenza B by PCR: NEGATIVE
SARS Coronavirus 2 by RT PCR: NEGATIVE

## 2020-08-02 LAB — LIPASE, BLOOD: Lipase: 37 U/L (ref 11–51)

## 2020-08-02 LAB — PROTIME-INR
INR: 2.1 — ABNORMAL HIGH (ref 0.8–1.2)
Prothrombin Time: 22.6 seconds — ABNORMAL HIGH (ref 11.4–15.2)

## 2020-08-02 MED ORDER — FUROSEMIDE 10 MG/ML IJ SOLN: 60.0000 mg | Freq: Once | INTRAMUSCULAR | Status: DC

## 2020-08-02 MED ORDER — LIDOCAINE 5 % EX PTCH
1.0000 | MEDICATED_PATCH | CUTANEOUS | Status: DC
  Administered 2020-08-02: 1 via TRANSDERMAL
  Filled 2020-08-02: qty 1

## 2020-08-02 MED ORDER — ACETAMINOPHEN 325 MG PO TABS
650.0000 mg | ORAL_TABLET | Freq: Once | ORAL | Status: AC
  Administered 2020-08-02: 650 mg via ORAL
  Filled 2020-08-02: qty 2

## 2020-08-02 MED ORDER — CEPHALEXIN 500 MG PO CAPS: 500.0000 mg | ORAL_CAPSULE | Freq: Three times a day (TID) | ORAL | 0 refills | Status: AC

## 2020-08-02 MED ORDER — LOSARTAN POTASSIUM 25 MG PO TABS
100.0000 mg | ORAL_TABLET | Freq: Once | ORAL | Status: AC
  Administered 2020-08-02: 100 mg via ORAL
  Filled 2020-08-02: qty 4

## 2020-08-02 MED ORDER — METOPROLOL TARTRATE 25 MG PO TABS
50.0000 mg | ORAL_TABLET | Freq: Once | ORAL | Status: AC
  Administered 2020-08-02: 50 mg via ORAL
  Filled 2020-08-02: qty 2

## 2020-08-02 MED ORDER — CEPHALEXIN 500 MG PO CAPS
500.0000 mg | ORAL_CAPSULE | Freq: Once | ORAL | Status: AC
  Administered 2020-08-02: 500 mg via ORAL
  Filled 2020-08-02: qty 1

## 2020-08-02 NOTE — ED Notes (Signed)
Pt to xray

## 2020-08-02 NOTE — ED Triage Notes (Signed)
Pt came from home via EMS. C/C Right sided abd pain that radiates to her back. Started yesterday late afternoon. Pain 8/10. PMH of HTN, Afib.  100 mcg of fentanyl, slightly relieved pain. Denies SOB, chest, N/V/D. Able to ambulate with walker with EMS. Hsn't had her AM meds. Took tylenol last night and tylenol this AM around 0500.  CBG 106 18 in left forearm A&O x4

## 2020-08-02 NOTE — ED Notes (Signed)
Pt sleeping comfortably.

## 2020-08-02 NOTE — ED Notes (Signed)
Pt moving around a lot to be placed on bed pan. HR elevated due to that activity

## 2020-08-02 NOTE — ED Notes (Signed)
Pt placed on purewick at 60 mmHg. 

## 2020-08-02 NOTE — ED Notes (Addendum)
D/c papers given and pt signed. Waiting on daughter to come back from cafeteria to finish d/c process

## 2020-08-02 NOTE — ED Notes (Signed)
Purewick irritated pt's labia. This nurse removed purewick and placed pt on bedpan to urinate

## 2020-08-02 NOTE — ED Provider Notes (Signed)
[facility] DEPT Provider Note   CSN: [id number] Arrival date & time: 08/02/20  0701     History Chief Complaint  Patient presents with  . Abdominal Pain    [name] is a 86 y.o. female with a past medical history significant for atrial fibrillation on chronic Coumadin which she has been compliant with, diastolic congestive heart failure, hypertension, history of PE, and pulmonary hypertension who presents to the ED via EMS due to right-sided rib/ upper abdominal pain that started yesterday evening.  Patient denies any aggravating or alleviating factors.  She has been taking Tylenol for pain with mild relief.  She notes it is difficult to describe the pain. Denies shortness of breath and central chest pain. Admits to longstanding bilateral calf pain with no change over the past few days. Denies overlying rash to region. Denies injury to right-sided ribs.  Denies fever, chills, nausea, vomiting, diarrhea, cough, urinary and vaginal symptoms. Denies any previous abdominal surgeries.   History obtained from patient and past medical records. No interpreter used during encounter.      Past Medical History:  Diagnosis Date  . Anticoagulant long-term use   . Arrhythmia   . Arthritis   . Atrial fibrillation ([place])   . Congestive heart failure (CHF) ([place])   . Dysphagia   . Esophageal reflux   . Essential hypertension   . Hiatal hernia   . Pulmonary embolus ([place])   . Thyroid disease     There are no problems to display for this patient.   Past Surgical History:  Procedure Laterality Date  . COLONOSCOPY    . ELBOW SURGERY    . LAPAROSCOPIC HYSTERECTOMY       OB History   No obstetric history on file.     Family History  Problem Relation Age of Onset  . Stroke Mother   . Cancer Brother     Social History   Tobacco Use  . Smoking status: Former Smoker    Packs/day: 1.00    Types: Cigarettes    Quit date: 10/02/2001    Years since  quitting: 18.8  . Smokeless tobacco: Never Used  . Tobacco comment: former smoker  Substance Use Topics  . Alcohol use: Not on file  . Drug use: Not on file    Home Medications Prior to Admission medications   Medication Sig Start Date End Date Taking? Authorizing Provider  bumetanide (BUMEX) 1 MG tablet TAKE 1 TABLET BY MOUTH EVERY DAY Patient taking differently: Take 1 mg by mouth daily.  04/30/20  Yes Buford Dresser, MD  cholecalciferol (VITAMIN D3) 25 MCG (1000 UT) tablet Take 1,000 Units by mouth 2 (two) times daily.    Yes [provider]  gabapentin (NEURONTIN) 300 MG capsule Take 300 mg by mouth 2 (two) times daily.    Yes [provider]  levothyroxine (SYNTHROID) 112 MCG tablet Take 112 mcg by mouth daily before breakfast.    Yes [provider]  losartan (COZAAR) 100 MG tablet Take 100 mg by mouth daily.   Yes [provider]  metoprolol succinate (TOPROL-XL) 50 MG 24 hr tablet Take 1 tablet (50 mg total) by mouth daily. Take with or immediately following a meal. 11/08/19 11/02/20 Yes Buford Dresser, MD  Multiple Vitamins-Minerals (PRESERVISION/LUTEIN) CAPS Take 1 capsule by mouth 2 (two) times daily.    Yes [provider]  pantoprazole (PROTONIX) 40 MG tablet Take 40 mg by mouth daily.   Yes [provider]  spironolactone (ALDACTONE) 25 MG tablet TAKE 1 TABLET BY MOUTH DAILY. EVERY MORNING WITH BREAKFAST Patient taking differently: Take 25 mg by mouth daily.  02/13/20  Yes Buford Dresser, MD  vitamin B-12 (CYANOCOBALAMIN) 500 MCG tablet Take 500 mcg by mouth daily.   Yes [provider]  warfarin (COUMADIN) 1 MG tablet Take 1-2 mg by mouth See admin instructions. Take 1 tablet ($RemoveB'1mg'buadvEis$ ) by mouth on Monday and Friday. All other days take 2 tablets ($RemoveBe'2mg'BVFmWDXdM$ )   Yes [provider]  cephALEXin (KEFLEX) 500 MG capsule Take 1 capsule (500 mg total) by mouth 3 (three) times daily for 5 days. 08/02/20  08/07/20  Suzy Bouchard, PA-C    Allergies    Acetaminophen, Amoxicillin, Codeine, Erythromycin, Iodine, Oxycodone, Oxycodone-acetaminophen, Penicillins, Sulfasalazine, and Tramadol  Review of Systems   Review of Systems  Constitutional: Negative for chills and fever.  Respiratory: Negative for cough and shortness of breath.   Cardiovascular: Positive for chest pain (right sided rib pain) and leg swelling.  Gastrointestinal: Negative for diarrhea, nausea and vomiting.  Genitourinary: Negative for dysuria.  Musculoskeletal: Negative for back pain.  Skin: Negative for rash.  Neurological: Negative for headaches.  All other systems reviewed and are negative.   Physical Exam Updated Vital Signs BP (!) 183/64   Pulse (!) 53   Temp 97.6 F (36.4 C) (Oral)   Resp 19   Ht $R'5\' 3"'jO$  (1.6 m)   Wt 84.6 kg   SpO2 99%   BMI 33.04 kg/m   Physical Exam Vitals and nursing note reviewed.  Constitutional:      General: She is not in acute distress. HENT:     Head: Normocephalic.  Eyes:     Pupils: Pupils are equal, round, and reactive to light.  Cardiovascular:     Rate and Rhythm: Normal rate and regular rhythm.     Pulses: Normal pulses.     Heart sounds: Normal heart sounds. No murmur heard.  No friction rub. No gallop.   Pulmonary:     Effort: Pulmonary effort is normal.     Breath sounds: Normal breath sounds.  Abdominal:     General: Abdomen is flat. Bowel sounds are normal. There is no distension.     Palpations: Abdomen is soft.     Tenderness: There is no abdominal tenderness. There is no guarding or rebound.     Comments: Abdomen soft, nondistended, with mild RUQ tenderness that radiates to right side. No rebound or guarding.   Musculoskeletal:     Cervical back: Neck supple.     Comments: Bilateral lower extremity edema. Bilateral calf tenderness.  Skin:    General: Skin is warm and dry.     Findings: No rash.     Comments: No overlying rash in right rib region    Neurological:     General: No focal deficit present.  Psychiatric:        Mood and Affect: Mood normal.        Behavior: Behavior normal.     ED Results / Procedures / Treatments   Labs (all labs ordered are listed, but only abnormal results are displayed) Labs Reviewed  COMPREHENSIVE METABOLIC PANEL - Abnormal; Notable for the following components:      Result Value   Sodium 134 (*)    Chloride 97 (*)    Glucose, Bld 103 (*)    Creatinine, Ser 1.33 (*)    Alkaline Phosphatase 156 (*)    GFR, Estimated 38 (*)  All other components within normal limits  PROTIME-INR - Abnormal; Notable for the following components:   Prothrombin Time 22.6 (*)    INR 2.1 (*)    All other components within normal limits  URINALYSIS, ROUTINE W REFLEX MICROSCOPIC - Abnormal; Notable for the following components:   Nitrite POSITIVE (*)    Leukocytes,Ua TRACE (*)    Bacteria, UA RARE (*)    All other components within normal limits  RESP PANEL BY RT-PCR (FLU A&B, COVID) ARPGX2  URINE CULTURE  CBC WITH DIFFERENTIAL/PLATELET  LIPASE, BLOOD    EKG None  Radiology CT ABDOMEN PELVIS WO CONTRAST  Result Date: 08/02/2020 CLINICAL DATA:  Right-sided abdominal pain radiating to the back EXAM: CT ABDOMEN AND PELVIS WITHOUT CONTRAST TECHNIQUE: Multidetector CT imaging of the abdomen and pelvis was performed following the standard protocol without IV contrast. COMPARISON:  None. FINDINGS: Lower chest: Cardiomegaly. Bibasilar atelectasis. Scattered areas of ground-glass opacity within the right middle lobe and left lower lobe. Hepatobiliary: Unremarkable unenhanced appearance of the liver. No focal liver lesion identified. Gallbladder within normal limits. No hyperdense gallstone. No biliary dilatation. Pancreas: Unremarkable. No pancreatic ductal dilatation or surrounding inflammatory changes. Spleen: Normal in size without focal abnormality. Adrenals/Urinary Tract: Unremarkable adrenal glands. Simple 3.7  cm upper pole left renal cyst. Kidneys have an otherwise unremarkable unenhanced appearance. No renal stone or hydronephrosis. Bilateral ureters are unremarkable. Urinary bladder is within normal limits. Stomach/Bowel: Stomach is within normal limits. Colonic diverticulosis, most pronounced within the sigmoid colon. No evidence of bowel wall thickening, distention, or inflammatory changes. Vascular/Lymphatic: Extensive aortoiliac atherosclerotic calcification including bulky calcifications at the origins of the superior mesenteric and bilateral renal arteries. No aneurysm. No abdominopelvic lymphadenopathy. Reproductive: Status post hysterectomy. No adnexal masses. Other: No free fluid. No abdominopelvic fluid collection. No pneumoperitoneum. No abdominal wall hernia. 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 Slight lumbar levocurvature with advanced multilevel lumbar spondylosis. Mild bilateral hip arthropathy. Moderate arthropathy of the pubic symphysis. No acute osseous abnormality. No suspicious bony lesion. IMPRESSION: 1. No acute abdominopelvic findings. 2. Colonic diverticulosis without evidence of acute diverticulitis. 3. Cardiomegaly. 4. Scattered areas of ground-glass opacity within the right middle lobe and left lower lobe, which may represent an infectious or inflammatory process. 5. Advanced multilevel lumbar spondylosis. 6. Aortic atherosclerosis. (ICD10-I70.0). Electronically Signed   By: Davina Poke D.O.   On: 08/02/2020 11:41   DG Ribs Unilateral W/Chest Right  Result Date: 08/02/2020 CLINICAL DATA:  Right-sided rib pain. EXAM: RIGHT RIBS AND CHEST - 3+ VIEW COMPARISON:  None FINDINGS: Cardiac enlargement. Aortic atherosclerotic calcifications. No pleural effusion or edema. No airspace opacities. No displaced rib fractures identified. Marked degenerative changes are noted involving both glenohumeral joints.  IMPRESSION: No acute findings.  No displaced rib fractures identified. Cardiac enlargement and Aortic Atherosclerosis (ICD10-I70.0). Electronically Signed   By: Kerby Moors M.D.   On: 08/02/2020 08:29    Procedures Procedures (including critical care time)  Medications Ordered in ED Medications  lidocaine (LIDODERM) 5 % 1 patch (1 patch Transdermal Patch Applied 08/02/20 0808)  cephALEXin (KEFLEX) capsule 500 mg (has no administration in time range)  acetaminophen (TYLENOL) tablet 650 mg (650 mg Oral Given 08/02/20 1306)  losartan (COZAAR) tablet 100 mg (100 mg Oral Given 08/02/20 1309)  metoprolol tartrate (LOPRESSOR) tablet 50 mg (50 mg Oral Given 08/02/20 1329)    ED Course  I have reviewed the triage vital signs and the nursing notes.  Pertinent  labs & imaging results that were available during my care of the patient were reviewed by me and considered in my medical decision making (see chart for details).  Clinical Course as of Aug 03 1527  Thu Aug 02, 2020  7846 Alkaline Phosphatase(!): 156 [CA]  0942 Creatinine(!): 1.33 [CA]  0943 INR(!): 2.1 [CA]  0951 Upon reassessment, patient has significant RUQ tenderness. Will obtain CT abdomen to rule out acute abdomen. HR in the 80s during reassessment. Will give HTN medications.   [CA]  1104 Informed by daughter that patient may have a contrast allergy. Unsure which dye. No documented CT with contrast in the patient's chart. Will switch to CT WO contrast. Discussed with patient and daughter the limitations of study.   [CA]  1105 Lipase: 37 [CA]  1240 SARS Coronavirus 2 by RT PCR: NEGATIVE [CA]  1304 Influenza B By PCR: NEGATIVE [CA]  1402 BP continues to trend up. With pulmonary opacities and fluid overload on exam, will give lasix $RemoveBe'60mg'wmdXlsWwu$  and continue to monitor BP. Discussed case with Dr. [name] Sparrow who evaluated patient at bedside and agrees with assessment and plan   [CA]  1406 Nitrite(!): POSITIVE [CA]  1445 Leukocytes,Ua(!): TRACE  [CA]  1527 Daughter notes that patient's lower extremities are always the edematous with no change from baseline. Patient would prefer to take Bumex at home rather than being given lasix here in the ED.   [CA]    Clinical Course User Index [CA] Suzy Bouchard, PA-C   MDM Rules/Calculators/A&P                         86 year old female presents to the ED due to right-sided rib pain that started yesterday evening.  Patient has a history of PE but has been compliant with her Coumadin. Patient denies any respiratory symptoms. Denies central chest pain.  Upon arrival, patient is afebrile, not tachycardic or hypoxic.  Blood pressure elevated at 186/71.  Patient in no acute distress and nontoxic-appearing physical exam reassuring.  No reproducible right-sided tenderness.  Abdomen soft, nondistended with mild RUQ tenderness that radiates to right side. Negative CVA tenderness bilaterally.  Low suspicion for pyelonephritis and kidney stone. No overlying rash to suggest shingles. Will obtain routine labs. INR to ensure in therapeutic window to rule out PE. CXR to rule out pneumonia and rib injury. Suspect symptoms could be related to possible muscle spasms or early shingles infection.   CXR personally reviewed which demonstrates: IMPRESSION:  No acute findings. No displaced rib fractures identified.    Cardiac enlargement and Aortic Atherosclerosis (ICD10-I70.0).   CBC unremarkable with no leukocytosis and normal hemoglobin. CMP significant for elevation in Alk phos, mild hyponatremia at 134, and elevated creatinine at 1.33 (which appears better than baseline). INR within therapeutic window, low suspicion for PE. Lipase normal. Doubt pancreatitis. CT scan personally reviewed which demonstrates: IMPRESSION:  1. No acute abdominopelvic findings.  2. Colonic diverticulosis without evidence of acute diverticulitis.  3. Cardiomegaly.  4. Scattered areas of ground-glass opacity within the right middle    lobe and left lower lobe, which may represent an infectious or  inflammatory process.  5. Advanced multilevel lumbar spondylosis.  6. Aortic atherosclerosis. (ICD10-I70.0).   COVID/Flu negative. UA positive for nitrites and leukocytes. Will treat for acute cystitis. Suspect right-sided pain could possibly be MSK etiology given increased activity yesterday apart from her baseline. Instructed patient to follow-up with PCP within the next week to recheck BP and alk phos.  BP improved during patient's ED stay. Patient discharged with Keflex for acute cystitis. Advised patient to take tylenol as needed for pain. Strict ED precautions discussed with patient. Patient states understanding and agrees to plan. Patient discharged home in no acute distress and stable vitals  Discussed case with Dr. [name] Sparrow who evaluated patient at bedside and agrees with assessment and plan.  Final Clinical Impression(s) / ED Diagnoses Final diagnoses:  RUQ abdominal pain  Rib pain on right side  Elevated blood pressure reading  Acute cystitis without hematuria  Elevated alkaline phosphatase level    Rx / DC Orders ED Discharge Orders         Ordered    cephALEXin (KEFLEX) 500 MG capsule  3 times daily        08/02/20 [place], Neola Worrall C, [place] 08/02/20 1544    Pattricia Boss, MD 08/03/20 [phone]

## 2020-08-02 NOTE — Discharge Instructions (Addendum)
As discussed, all of your labs and images were reassuring. Your urine showed you have a urinary tract infection. I am sending you home with an antibiotic. Take as prescribed. Please follow-up with your PCP within the next week to recheck blood pressure and check your alkaline phosphatase level. Return to the ER for new or worsening symptoms.

## 2020-08-04 LAB — URINE CULTURE: Culture: >=100000 — AB

## 2020-08-04 NOTE — Progress Notes (Signed)
[facility] Note  08/07/2020     CHIEF COMPLAINT Patient presents for Retina Follow Up   HISTORY OF PRESENT ILLNESS: [name] is a 86 y.o. female who presents to the clinic today for:   HPI    Retina Follow Up    Patient presents with  Dry AMD.  In both eyes.  Severity is moderate.  Duration of 4 months.  Since onset it is gradually improving.  I, the attending physician,  performed the HPI with the patient and updated documentation appropriately.          Comments    Patient states vision improved some after yag cap with Dr. Frederico Hamman. ? Which eye was done--patient not sure.        Last edited by Bernarda Caffey, MD on 08/07/2020  9:43 PM. (History)    pt had Yag with Dr. Frederico Hamman and states vision has improved since then, pt is using Refresh drops as needed for dryness  Referring physician: Maurice Small, MD [address],  [place] [address]  HISTORICAL INFORMATION:   Selected notes from the MEDICAL RECORD NUMBER Referred by Dr. Frederico Hamman for ret eval OU   CURRENT MEDICATIONS: No current outpatient medications on file. (Ophthalmic Drugs)   No current facility-administered medications for this visit. (Ophthalmic Drugs)   Current Outpatient Medications (Other)  Medication Sig  . bumetanide (BUMEX) 1 MG tablet TAKE 1 TABLET BY MOUTH EVERY DAY (Patient taking differently: Take 1 mg by mouth daily. )  . cephALEXin (KEFLEX) 500 MG capsule Take 1 capsule (500 mg total) by mouth 3 (three) times daily for 5 days.  . cholecalciferol (VITAMIN D3) 25 MCG (1000 UT) tablet Take 1,000 Units by mouth 2 (two) times daily.   [name] Kitchen gabapentin (NEURONTIN) 300 MG capsule Take 300 mg by mouth 2 (two) times daily.   [name] Kitchen levothyroxine (SYNTHROID) 112 MCG tablet Take 112 mcg by mouth daily before breakfast.   . losartan (COZAAR) 100 MG tablet Take 100 mg by mouth daily.  . metoprolol succinate (TOPROL-XL) 50 MG 24 hr tablet Take 1 tablet (50 mg  total) by mouth daily. Take with or immediately following a meal.  . Multiple Vitamins-Minerals (PRESERVISION/LUTEIN) CAPS Take 1 capsule by mouth 2 (two) times daily.   . pantoprazole (PROTONIX) 40 MG tablet Take 40 mg by mouth daily.  [name] Kitchen spironolactone (ALDACTONE) 25 MG tablet TAKE 1 TABLET BY MOUTH DAILY. EVERY MORNING WITH BREAKFAST (Patient taking differently: Take 25 mg by mouth daily. )  . vitamin B-12 (CYANOCOBALAMIN) 500 MCG tablet Take 500 mcg by mouth daily.  [name] Kitchen warfarin (COUMADIN) 1 MG tablet Take 1-2 mg by mouth See admin instructions. Take 1 tablet ($RemoveB'1mg'AOpPUgum$ ) by mouth on Monday and Friday. All other days take 2 tablets ($RemoveBe'2mg'iuiMqTYvc$ )   No current facility-administered medications for this visit. (Other)      REVIEW OF SYSTEMS: ROS    Positive for: Eyes   Negative for: Constitutional, Gastrointestinal, Neurological, Skin, Genitourinary, Musculoskeletal, HENT, Endocrine, Cardiovascular, Respiratory, Psychiatric, Allergic/Imm, Heme/Lymph   Last edited by Roselee Nova D, COT on 08/07/2020  1:27 PM. (History)       ALLERGIES Allergies  Allergen Reactions  . Acetaminophen   . Amoxicillin   . Codeine   . Erythromycin   . Iodine   . Oxycodone   . Oxycodone-Acetaminophen   . Penicillins   . Sulfasalazine   . Tramadol     PAST MEDICAL HISTORY Past Medical History:  Diagnosis Date  . Anticoagulant long-term use   . Arrhythmia   . Arthritis   . Atrial fibrillation ([place])   . Congestive heart failure (CHF) ([place])   . Dysphagia   . Esophageal reflux   . Essential hypertension   . Hiatal hernia   . Pulmonary embolus ([place])   . Thyroid disease    Past Surgical History:  Procedure Laterality Date  . COLONOSCOPY    . ELBOW SURGERY    . LAPAROSCOPIC HYSTERECTOMY      FAMILY HISTORY Family History  Problem Relation Age of Onset  . Stroke Mother   . Cancer Brother     SOCIAL HISTORY Social History   Tobacco Use  . Smoking status: Former Smoker    Packs/day: 1.00     Types: Cigarettes    Quit date: 10/02/2001    Years since quitting: 18.8  . Smokeless tobacco: Never Used  . Tobacco comment: former smoker  Substance Use Topics  . Alcohol use: Not on file  . Drug use: Not on file         OPHTHALMIC EXAM:  Base Eye Exam    Visual Acuity (Snellen - Linear)      Right Left   Dist cc 20/40 -2 20/50 -2   Dist ph cc 20/40 -2 20/40 +2   Correction: Glasses       Tonometry (Tonopen, 1:38 PM)      Right Left   Pressure 11 12       Pupils      Dark Light Shape React APD   Right 4 3 Round Brisk None   Left 4 3 Round Brisk None       Visual Fields (Counting fingers)      Left Right    Full Full       Extraocular Movement      Right Left    Full, Ortho Full, Ortho       Neuro/Psych    Oriented x3: Yes   Mood/Affect: Normal       Dilation    Both eyes: 1.0% Mydriacyl, 2.5% Phenylephrine @ 1:38 PM        Slit Lamp and Fundus Exam    Slit Lamp Exam      Right Left   Lids/Lashes Dermatochalasis - upper lid, mild Meibomian gland dysfunction Dermatochalasis - upper lid, mild Meibomian gland dysfunction   Conjunctiva/Sclera White and quiet White and quiet   Cornea arcus, 2+ Punctate epithelial erosions, irregular tear film, decreased TBUT, mild central haze arcus, 2-3+ Punctate epithelial erosions, irregular tear film, decreased TBUT, mild central haze   Anterior Chamber Deep and clear, narrow temporal angle Deep and clear, narrow temporal angle   Iris Round and dilated Round and dilated   Lens PC IOL in good position with open PC PC IOL in good position, 1-2+ Posterior capsular opacification   Vitreous Vitreous syneresis Vitreous syneresis       Fundus Exam      Right Left   Disc 360 PPA, mld Pallor, Sharp rim mild Pallor, Sharp rim   C/D Ratio 0.3 0.3   Macula Flat, Blunted foveal reflex, RPE mottling, clumping and atrophy, Drusen, No heme or edema Flat, Blunted foveal reflex, RPE mottling, clumping and atrophy, +GA, No heme or  edema   Vessels Vascular attenuation, mild tortuousity Vascular attenuation, Tortuous   Periphery Attached, reticular degeneration, No heme  Attached, mild reticular degeneration, No heme         Refraction  Wearing Rx      Sphere Cylinder Axis   Right -1.50 +1.50 005   Left -0.25 +1.50 175          IMAGING AND PROCEDURES  Imaging and Procedures for 08/07/2020  OCT, Retina - OU - Both Eyes       Right Eye Quality was good. Central Foveal Thickness: 231. Progression has been stable. Findings include normal foveal contour, no IRF, no SRF, retinal drusen , intraretinal hyper-reflective material, outer retinal atrophy.   Left Eye Quality was good. Central Foveal Thickness: 230. Progression has been stable. Findings include normal foveal contour, no IRF, no SRF, retinal drusen , outer retinal atrophy, intraretinal hyper-reflective material.   Notes *Images captured and stored on drive  Diagnosis / Impression:  Nonexudative ARMD OU Focal peripapillary ORA OU No IRF/SRF OU  Clinical management:  See below  Abbreviations: NFP - Normal foveal profile. CME - cystoid macular edema. PED - pigment epithelial detachment. IRF - intraretinal fluid. SRF - subretinal fluid. EZ - ellipsoid zone. ERM - epiretinal membrane. ORA - outer retinal atrophy. ORT - outer retinal tubulation. SRHM - subretinal hyper-reflective material. IRHM - intraretinal hyper-reflective material                 ASSESSMENT/PLAN:    ICD-10-CM   1. Advanced atrophic nonexudative age-related macular degeneration of both eyes without subfoveal involvement  H35.3133   2. Retinal edema  H35.81 OCT, Retina - OU - Both Eyes  3. Essential hypertension  I10   4. Hypertensive retinopathy of both eyes  H35.033   5. Pseudophakia, both eyes  Z96.1   6. PCO (posterior capsular opacification), left  H26.492   7. Dry eyes  H04.123     1,2. Age related macular degeneration, non-exudative, with +GA, both eyes  (OS>OD)  - focal peripapillary ORA OU  - stable from prior  - BCVA 20/40 OU  - The incidence, anatomy, and pathology of dry AMD, risk of progression, and the AREDS and AREDS 2 study including smoking risks discussed with patient.  - Recommend amsler grid monitoring  - f/u 6 months, DFE, OCT  3,4. Hypertensive retinopathy OU - discussed importance of tight BP control - monitor  5. Pseudophakia OU  - s/p CE/IOL OU  - IOLs in good position  - s/p YAG Cap OD (Dr. Frederico Hamman) - monitor  6. PCO OS  - mild  7. Dry eyes OU  - recommend artificial tears and lubricating ointment as needed  Ophthalmic Meds Ordered this visit:  No orders of the defined types were placed in this encounter.      Return in about 6 months (around 02/04/2021) for f/u non-exu ARMD OU, DFE, OCT.  There are no Patient Instructions on file for this visit.   Explained the diagnoses, plan, and follow up with the patient and they expressed understanding.  Patient expressed understanding of the importance of proper follow up care.  This document serves as a record of services personally performed by Gardiner Sleeper, MD, PhD. It was created on their behalf by Roselee Nova, COMT. The creation of this record is the provider's dictation and/or activities during the visit.  Electronically signed by: Roselee Nova, COMT 08/07/20 9:51 PM  Gardiner Sleeper, M.D., Ph.D. Diseases & Surgery of the Retina and Vitreous [facility]  I have reviewed the above documentation for accuracy and completeness, and I agree with the above. Gardiner Sleeper, M.D., Ph.D. 08/07/20 9:51 PM  Abbreviations: M myopia (nearsighted); A astigmatism; H hyperopia (farsighted); P presbyopia; Mrx spectacle prescription;  CTL contact lenses; OD right eye; OS left eye; OU both eyes  XT exotropia; ET esotropia; PEK punctate epithelial keratitis; PEE punctate epithelial erosions; DES dry eye syndrome; MGD meibomian gland dysfunction;  ATs artificial tears; PFAT's preservative free artificial tears; [place] nuclear sclerotic cataract; PSC posterior subcapsular cataract; ERM epi-retinal membrane; PVD posterior vitreous detachment; RD retinal detachment; DM diabetes mellitus; DR diabetic retinopathy; NPDR non-proliferative diabetic retinopathy; PDR proliferative diabetic retinopathy; CSME clinically significant macular edema; DME diabetic macular edema; dbh dot blot hemorrhages; CWS cotton wool spot; POAG primary open angle glaucoma; C/D cup-to-disc ratio; HVF humphrey visual field; GVF goldmann visual field; OCT optical coherence tomography; IOP intraocular pressure; BRVO Branch retinal vein occlusion; CRVO central retinal vein occlusion; CRAO central retinal artery occlusion; BRAO branch retinal artery occlusion; RT retinal tear; SB scleral buckle; PPV pars plana vitrectomy; VH Vitreous hemorrhage; PRP panretinal laser photocoagulation; IVK intravitreal kenalog; VMT vitreomacular traction; MH Macular hole;  NVD neovascularization of the disc; NVE neovascularization elsewhere; AREDS age related eye disease study; ARMD age related macular degeneration; POAG primary open angle glaucoma; EBMD epithelial/anterior basement membrane dystrophy; ACIOL anterior chamber intraocular lens; IOL intraocular lens; PCIOL posterior chamber intraocular lens; Phaco/IOL phacoemulsification with intraocular lens placement; [place] photorefractive keratectomy; LASIK laser assisted in situ keratomileusis; HTN hypertension; DM diabetes mellitus; COPD chronic obstructive pulmonary disease

## 2020-08-05 ENCOUNTER — Telehealth (HOSPITAL_BASED_OUTPATIENT_CLINIC_OR_DEPARTMENT_OTHER): Payer: Self-pay | Admitting: Emergency Medicine

## 2020-08-05 NOTE — Telephone Encounter (Signed)
Post ED Visit - Positive Culture Follow-up  Culture report reviewed by antimicrobial stewardship pharmacist: [facility] []  , Pharm.D. []  Enzo Bi, Pharm.D., BCPS AQ-ID []  , Pharm.D., BCPS []  Celedonio Miyamoto, .D., BCPS []  [place], .D., BCPS, AAHIVP []  Georgina Pillion, Pharm.D., BCPS, AAHIVP []  [address], PharmD, BCPS []  , PharmD, BCPS []  [place], PharmD, BCPS []  [place], PharmD []  , PharmD, BCPS []  Estella Husk, PharmD  Pharmacy Team []  Lysle Pearl, PharmD []  , PharmD []  Phillips Climes, PharmD []  , Rph []  Agapito Games) , PharmD []  Verlan Friends, PharmD []  , PharmD [x]  Mervyn Gay, PharmD []  , PharmD []  Vinnie Level, PharmD []  [facility], PharmD []  , PharmD []  Len Childs, PharmD   Positive urine culture Treated with Cephalexin, organism sensitive to the same and no further patient follow-up is required at this time.  Jaleesa Cervi 08/05/2020, 12:23 PM

## 2020-08-07 ENCOUNTER — Ambulatory Visit (INDEPENDENT_AMBULATORY_CARE_PROVIDER_SITE_OTHER): Payer: Medicare HMO | Admitting: Ophthalmology

## 2020-08-07 ENCOUNTER — Other Ambulatory Visit: Payer: Self-pay

## 2020-08-07 ENCOUNTER — Encounter (INDEPENDENT_AMBULATORY_CARE_PROVIDER_SITE_OTHER): Payer: Self-pay | Admitting: Ophthalmology

## 2020-08-07 DIAGNOSIS — H35033 Hypertensive retinopathy, bilateral: Secondary | ICD-10-CM | POA: Diagnosis not present

## 2020-08-07 DIAGNOSIS — H353133 Nonexudative age-related macular degeneration, bilateral, advanced atrophic without subfoveal involvement: Secondary | ICD-10-CM | POA: Diagnosis not present

## 2020-08-07 DIAGNOSIS — H3581 Retinal edema: Secondary | ICD-10-CM

## 2020-08-07 DIAGNOSIS — Z961 Presence of intraocular lens: Secondary | ICD-10-CM

## 2020-08-07 DIAGNOSIS — H26492 Other secondary cataract, left eye: Secondary | ICD-10-CM

## 2020-08-07 DIAGNOSIS — H04123 Dry eye syndrome of bilateral lacrimal glands: Secondary | ICD-10-CM

## 2020-08-07 DIAGNOSIS — I10 Essential (primary) hypertension: Secondary | ICD-10-CM | POA: Diagnosis not present

## 2020-08-08 ENCOUNTER — Ambulatory Visit (INDEPENDENT_AMBULATORY_CARE_PROVIDER_SITE_OTHER): Payer: Medicare HMO | Admitting: Cardiology

## 2020-08-08 ENCOUNTER — Encounter: Payer: Self-pay | Admitting: Cardiology

## 2020-08-08 VITALS — BP 141/58 | HR 49 | Ht 63.0 in | Wt 190.0 lb

## 2020-08-08 DIAGNOSIS — I4821 Permanent atrial fibrillation: Secondary | ICD-10-CM | POA: Diagnosis not present

## 2020-08-08 DIAGNOSIS — I272 Pulmonary hypertension, unspecified: Secondary | ICD-10-CM | POA: Diagnosis not present

## 2020-08-08 DIAGNOSIS — I5032 Chronic diastolic (congestive) heart failure: Secondary | ICD-10-CM | POA: Diagnosis not present

## 2020-08-08 DIAGNOSIS — Z7901 Long term (current) use of anticoagulants: Secondary | ICD-10-CM

## 2020-08-08 DIAGNOSIS — K5900 Constipation, unspecified: Secondary | ICD-10-CM

## 2020-08-08 NOTE — Patient Instructions (Signed)

## 2020-08-08 NOTE — Progress Notes (Signed)
Cardiology Office Note:    Date:  08/08/2020   ID:  [name], DOB 01/05/1928, MRN [id number]  PCP:  Megan Mylar, MD  Cardiologist:  Megan Red, MD PhD  Referring MD: Megan Mylar, MD   CC: follow up  History of Present Illness:    [name] is a 86 y.o. female with a hx of atrial fibrillation on coumadin, essential hypertension, pulmonary hypertension, and chronic diastolic heart failure. Her initial consult (telemedicine) was 11/25/18.  Cardiac history: Previously followed by Dr. Orvis Brill at [facility] (notes under media tab). Per notes:  -echo 2014, lexiscan 2015 low risk,  -echo 2019 hyperdynamic EF, septal flattening, severe RV dilation, RVSP noted as critical (near systemic) pulmonary hypertension, severe biatrial enlargement, dilated coronary sinus ?persistent left SVC.  -Venous study 2019 with bilateral mild insufficiency -Echo 11/10/17 with hyperdynamic EF, septal flattening, severe RV dilation, severe TR, severe pulmonary hypertension (listed at 71 mmHg in note)  Today: Was in ER on Thanksgiving for right sided upper abdominal pain. Etiology unclear. Pain was so severe that daughter called 911. At home, temp was 97.2 F. When EMS came, they couldn't get it temporally. Reported that they tried to take around her neck and read 2 F. She did have her pajamas and layers up to her neck at the time.   In ER, temp 97.6 F. CT abd/pelvis unremarkable. Had a UTI (>100k colonies of e coli), but thought that it was unlikely that this was the cause of the pain--treated just in case. Did have a fall about 2 weeks prior but hit her left side, not her right. Seen by Dr. Hyman Hopes about a week later, thought to have bruised ribs.   Denies lightheadedness/dizziness. Chronically fatigued during the day as she doesn't sleep well at night. HR today 49 bpm, denies symptoms.  She has also been struggling with constipation, discussed several options for management today.  Past  Medical History:  Diagnosis Date  . Anticoagulant long-term use   . Arrhythmia   . Arthritis   . Atrial fibrillation (HCC)   . Congestive heart failure (CHF) (HCC)   . Dysphagia   . Esophageal reflux   . Essential hypertension   . Hiatal hernia   . Pulmonary embolus (HCC)   . Thyroid disease     Past Surgical History:  Procedure Laterality Date  . COLONOSCOPY    . ELBOW SURGERY    . LAPAROSCOPIC HYSTERECTOMY      Current Medications: Current Outpatient Medications on File Prior to Visit  Medication Sig  . bumetanide (BUMEX) 1 MG tablet TAKE 1 TABLET BY MOUTH EVERY DAY (Patient taking differently: Take 1 mg by mouth daily. )  . cholecalciferol (VITAMIN D3) 25 MCG (1000 UT) tablet Take 1,000 Units by mouth 2 (two) times daily.   [name] Kitchen gabapentin (NEURONTIN) 300 MG capsule Take 300 mg by mouth 2 (two) times daily.   [name] Kitchen levothyroxine (SYNTHROID) 112 MCG tablet Take 112 mcg by mouth daily before breakfast.   . losartan (COZAAR) 100 MG tablet Take 100 mg by mouth daily.  . metoprolol succinate (TOPROL-XL) 50 MG 24 hr tablet Take 1 tablet (50 mg total) by mouth daily. Take with or immediately following a meal.  . Multiple Vitamins-Minerals (PRESERVISION/LUTEIN) CAPS Take 1 capsule by mouth 2 (two) times daily.   . pantoprazole (PROTONIX) 40 MG tablet Take 40 mg by mouth daily.  [name] Kitchen spironolactone (ALDACTONE) 25 MG tablet TAKE 1 TABLET BY MOUTH DAILY. EVERY MORNING WITH BREAKFAST (  Patient taking differently: Take 25 mg by mouth daily. )  . vitamin B-12 (CYANOCOBALAMIN) 500 MCG tablet Take 500 mcg by mouth daily.  [name] Kitchen warfarin (COUMADIN) 1 MG tablet Take 1-2 mg by mouth See admin instructions. Take 1 tablet (1mg ) by mouth on Monday and Friday. All other days take 2 tablets (2mg )   No current facility-administered medications on file prior to visit.     Allergies:   Acetaminophen, Amoxicillin, Codeine, Erythromycin, Iodine, Oxycodone, Oxycodone-acetaminophen, Penicillins, Sulfasalazine, and  Tramadol   Social History   Tobacco Use  . Smoking status: Former Smoker    Packs/day: 1.00    Types: Cigarettes    Quit date: 10/02/2001    Years since quitting: 18.8  . Smokeless tobacco: Never Used  . Tobacco comment: former smoker  Substance Use Topics  . Alcohol use: Not on file  . Drug use: Not on file     Family History: The patient's family history includes Cancer in her brother; Stroke in her mother.  ROS:   Please see the history of present illness.  Additional pertinent ROS otherwise unremarkable.  EKGs/Labs/Other Studies Reviewed:    The following studies were reviewed today: See cardiac history, above  EKG:  EKG is personally reviewed.  The ekg ordered 01/05/20 demonstrates afib at 45 bpm  Recent Labs: 08/02/2020: ALT 13; BUN 23; Creatinine, Ser 1.33; Hemoglobin 12.8; Platelets 221; Potassium 4.5; Sodium 134  Recent Lipid Panel No results found for: CHOL, TRIG, HDL, CHOLHDL, VLDL, LDLCALC, LDLDIRECT  Physical Exam:    VS:  BP (!) 141/58   Pulse (!) 49   Ht 5\' 3"  (1.6 m)   Wt 190 lb (86.2 kg)   SpO2 100%   BMI 33.66 kg/m     Wt Readings from Last 3 Encounters:  08/08/20 190 lb (86.2 kg)  08/02/20 186 lb 8.2 oz (84.6 kg)  02/09/20 186 lb 9.6 oz (84.6 kg)   GEN: Well nourished, well developed in no acute distress HEENT: Normal, moist mucous membranes NECK: No JVD CARDIAC: irregularly irregular rhythm, normal S1 and S2, no rubs or gallops. 2/6 systolic murmur. VASCULAR: Radial and DP pulses 2+ bilaterally. No carotid bruits RESPIRATORY:  Clear to auscultation without rales, wheezing or rhonchi  ABDOMEN: Soft, non-tender, non-distended MUSCULOSKELETAL: moves all 4 limbs independently SKIN: Warm and dry, trace bilateral LE edema NEUROLOGIC:  Alert and oriented x 3. No focal neuro deficits noted. PSYCHIATRIC:  Normal affect   ASSESSMENT:    1. Moderate to severe pulmonary hypertension (HCC)   2. Diastolic CHF, chronic (HCC)   3. Permanent atrial  fibrillation (HCC)   4. Anticoagulant long-term use   5. Constipation, unspecified constipation type    PLAN:    Peripheral edema 2/2 severe pulmonary hypertension and chronic diastolic heart failure -has stabilized significantly. Daughter very involved in her care. Weights and symptoms stable. -Continue bumex and spironolactone as noted -continue daily weights, leg elevation, compression stockings, salt avoidance -we have extensively discussed her pulmonary hypertension, see prior notes  Hypertension: elevated today, but typically at goal -continue diuretics as above -metoprolol succinate as below for rate control -continue losartan 100 mg daily  Atrial fibrillation, permanent -CHA2DS2/VAS=at least 5 -on coumadin, managed per PCP.  -tolerating metoprolol succinate 50 mg daily, monitor  Constipation: discussed both OTC and prescription options for management today. Diet is limited.  Plan for follow up: 6 mos or sooner as needed  14/01/21, MD, PhD, [facility] [place]  [facility]   Medication Adjustments/Labs and Tests  Ordered: Current medicines are reviewed at length with the patient today.  Concerns regarding medicines are outlined above.  No orders of the defined types were placed in this encounter.  No orders of the defined types were placed in this encounter.   Patient Instructions  Medication Instructions:  Your Physician recommend you continue on your current medication as directed.    *If you need a refill on your cardiac medications before your next appointment, please call your pharmacy*   Lab Work: None   Testing/Procedures: None   Follow-Up: At [facility], you and your health needs are our priority.  As part of our continuing mission to provide you with exceptional heart care, we have created designated Provider Care Teams.  These Care Teams include your primary Cardiologist (physician) and Advanced Practice Providers (APPs -   Physician Assistants and Nurse Practitioners) who all work together to provide you with the care you need, when you need it.  We recommend signing up for the patient portal called "MyChart".  Sign up information is provided on this After Visit Summary.  MyChart is used to connect with patients for Virtual Visits (Telemedicine).  Patients are able to view lab/test results, encounter notes, upcoming appointments, etc.  Non-urgent messages can be sent to your provider as well.   To learn more about what you can do with MyChart, go to [email or web address].    Your next appointment:   6 month(s)  The format for your next appointment:   In Person  Provider:   [name], MD       Signed, Megan Red, MD PhD 08/08/2020  [facility]

## 2020-08-21 ENCOUNTER — Other Ambulatory Visit: Payer: Self-pay | Admitting: Cardiology

## 2020-11-06 ENCOUNTER — Other Ambulatory Visit: Payer: Self-pay | Admitting: Cardiology

## 2020-11-06 DIAGNOSIS — I4821 Permanent atrial fibrillation: Secondary | ICD-10-CM

## 2020-11-19 ENCOUNTER — Encounter: Payer: Self-pay | Admitting: Cardiology

## 2020-12-06 ENCOUNTER — Other Ambulatory Visit: Payer: Self-pay | Admitting: Cardiology

## 2021-02-05 ENCOUNTER — Other Ambulatory Visit: Payer: Self-pay

## 2021-02-05 ENCOUNTER — Encounter (INDEPENDENT_AMBULATORY_CARE_PROVIDER_SITE_OTHER): Payer: Medicare HMO | Admitting: Ophthalmology

## 2021-02-05 ENCOUNTER — Ambulatory Visit (INDEPENDENT_AMBULATORY_CARE_PROVIDER_SITE_OTHER): Payer: Medicare HMO | Admitting: Cardiology

## 2021-02-05 ENCOUNTER — Encounter: Payer: Self-pay | Admitting: Cardiology

## 2021-02-05 VITALS — BP 124/56 | HR 54 | Ht 62.0 in | Wt 190.0 lb

## 2021-02-05 DIAGNOSIS — I5032 Chronic diastolic (congestive) heart failure: Secondary | ICD-10-CM

## 2021-02-05 DIAGNOSIS — I272 Pulmonary hypertension, unspecified: Secondary | ICD-10-CM

## 2021-02-05 DIAGNOSIS — Z7901 Long term (current) use of anticoagulants: Secondary | ICD-10-CM | POA: Diagnosis not present

## 2021-02-05 DIAGNOSIS — I4821 Permanent atrial fibrillation: Secondary | ICD-10-CM

## 2021-02-05 DIAGNOSIS — R600 Localized edema: Secondary | ICD-10-CM

## 2021-02-05 DIAGNOSIS — I10 Essential (primary) hypertension: Secondary | ICD-10-CM

## 2021-02-05 NOTE — Patient Instructions (Signed)
Medication Instructions:  Your Physician recommend you continue on your current medication as directed.    *If you need a refill on your cardiac medications before your next appointment, please call your pharmacy*   Lab Work: None ordered today   Testing/Procedures: None ordered   Follow-Up: At [facility], you and your health needs are our priority.  As part of our continuing mission to provide you with exceptional heart care, we have created designated Provider Care Teams.  These Care Teams include your primary Cardiologist (physician) and Advanced Practice Providers (APPs -  Physician Assistants and Nurse Practitioners) who all work together to provide you with the care you need, when you need it.  We recommend signing up for the patient portal called "MyChart".  Sign up information is provided on this After Visit Summary.  MyChart is used to connect with patients for Virtual Visits (Telemedicine).  Patients are able to view lab/test results, encounter notes, upcoming appointments, etc.  Non-urgent messages can be sent to your provider as well.   To learn more about what you can do with MyChart, go to [email or web address].    Your next appointment:   6 month(s) @ [address] [place], [place] [address]   The format for your next appointment:   In Person  Provider:   [name], MD

## 2021-02-05 NOTE — Progress Notes (Signed)
Cardiology Office Note:    Date:  02/05/2021   ID:  [name], DOB November 09, 1927, MRN [id number]  PCP:  Megan Small, MD  Cardiologist:  Megan Dresser, MD PhD  Referring MD: Megan Small, MD   CC: follow up  History of Present Illness:    [name] is a 87 y.o. female with a hx of atrial fibrillation on coumadin, essential hypertension, pulmonary hypertension, and chronic diastolic heart failure. Her initial consult (telemedicine) was 11/25/18.  Cardiac history: Previously followed by Dr. Tish [name] at [facility] (notes under media tab). Per notes:  -echo 2014, lexiscan 2015 low risk,  -echo 2019 hyperdynamic EF, septal flattening, severe RV dilation, RVSP noted as critical (near systemic) pulmonary hypertension, severe biatrial enlargement, dilated coronary sinus ?persistent left SVC.  -Venous study 2019 with bilateral mild insufficiency -Echo 11/10/17 with hyperdynamic EF, septal flattening, severe RV dilation, severe TR, severe pulmonary hypertension (listed at 71 mmHg in note)  Today: She is accompanied by her daughter, who also provides some history. She has some LE edema at this visit. Her daughter reports she is drinking more water recently, and is cutting back on soda. At home, her weight is typically 183-185 pounds. When she approaches 190 pounds they will change her diet including cutting out salt.    She has several bruises on her left LE. Also she has pain in her left arm due to arthritis.  She denies any chest pain, shortness of breath, palpitations, or exertional symptoms. No headaches, lightheadedness, or syncope to report. Also has no orthopnea or PND.   Past Medical History:  Diagnosis Date  . Anticoagulant long-term use   . Arrhythmia   . Arthritis   . Atrial fibrillation ([place])   . Congestive heart failure (CHF) ([place])   . Dysphagia   . Esophageal reflux   . Essential hypertension   . Hiatal hernia   . Pulmonary embolus ([name])   . Thyroid disease      Past Surgical History:  Procedure Laterality Date  . COLONOSCOPY    . ELBOW SURGERY    . LAPAROSCOPIC HYSTERECTOMY      Current Medications: Current Outpatient Medications on File Prior to Visit  Medication Sig  . bumetanide (BUMEX) 1 MG tablet TAKE 1 TABLET BY MOUTH EVERY DAY (Patient taking differently: Take 1 mg by mouth daily. )  . cholecalciferol (VITAMIN D3) 25 MCG (1000 UT) tablet Take 1,000 Units by mouth 2 (two) times daily.   [name] Kitchen gabapentin (NEURONTIN) 300 MG capsule Take 300 mg by mouth 2 (two) times daily.   [name] Kitchen levothyroxine (SYNTHROID) 112 MCG tablet Take 112 mcg by mouth daily before breakfast.   . losartan (COZAAR) 100 MG tablet Take 100 mg by mouth daily.  . metoprolol succinate (TOPROL-XL) 50 MG 24 hr tablet Take 1 tablet (50 mg total) by mouth daily. Take with or immediately following a meal.  . Multiple Vitamins-Minerals (PRESERVISION/LUTEIN) CAPS Take 1 capsule by mouth 2 (two) times daily.   . pantoprazole (PROTONIX) 40 MG tablet Take 40 mg by mouth daily.  [name] Kitchen spironolactone (ALDACTONE) 25 MG tablet TAKE 1 TABLET BY MOUTH EVERY MORNING WITH BREAKFAST  . vitamin B-12 (CYANOCOBALAMIN) 500 MCG tablet Take 500 mcg by mouth daily.  [name] Kitchen warfarin (COUMADIN) 1 MG tablet Take 1-2 mg by mouth See admin instructions. Take 1 tablet (11m) by mouth on Monday and Friday. All other days take 2 tablets (220m   No current facility-administered medications on file prior to visit.  Allergies:   Acetaminophen, Amoxicillin, Codeine, Erythromycin, Iodine, Oxycodone, Oxycodone-acetaminophen, Penicillins, Sulfasalazine, and Tramadol   Social History   Tobacco Use  . Smoking status: Former Smoker    Packs/day: 1.00    Types: Cigarettes    Quit date: 10/02/2001    Years since quitting: 19.3  . Smokeless tobacco: Never Used  . Tobacco comment: former smoker     Family History: The patient's family history includes Cancer in her brother; Stroke in her mother.  ROS:   Please see  the history of present illness.   (+) LE edema (+) Left arm pain Additional pertinent ROS otherwise unremarkable.  EKGs/Labs/Other Studies Reviewed:    The following studies were reviewed today: See cardiac history, above  EKG:  EKG is personally reviewed.   02/05/2021: Atrial fibrillation, Rate 54 bpm, iRBBB 01/05/20: Afib at 45 bpm  Recent Labs: 08/02/2020: ALT 13; BUN 23; Creatinine, Ser 1.33; Hemoglobin 12.8; Platelets 221; Potassium 4.5; Sodium 134  Recent Lipid Panel No results found for: CHOL, TRIG, HDL, CHOLHDL, VLDL, LDLCALC, LDLDIRECT  Physical Exam:    VS:  BP (!) 124/56 (BP Location: Left Arm, Patient Position: Sitting, Cuff Size: Normal)   Pulse (!) 54   Ht _0  (1.575 m)   Wt 190 lb (86.2 kg)   BMI 34.75 kg/m     Wt Readings from Last 3 Encounters:  02/05/21 190 lb (86.2 kg)  08/08/20 190 lb (86.2 kg)  08/02/20 186 lb 8.2 oz (84.6 kg)   GEN: Well nourished, well developed in no acute distress HEENT: Normal, moist mucous membranes NECK: No JVD CARDIAC: irregularly irregular rhythm, normal S1 and S2, no rubs or gallops. 2/6 systolic murmur. VASCULAR: Radial and DP pulses 2+ bilaterally. No carotid bruits RESPIRATORY:  Clear to auscultation without rales, wheezing or rhonchi  ABDOMEN: Soft, non-tender, non-distended MUSCULOSKELETAL: moves all 4 limbs independently SKIN: Warm and dry, trace bilateral LE edema NEUROLOGIC:  Alert and oriented x 3. No focal neuro deficits noted. PSYCHIATRIC:  Normal affect   ASSESSMENT:    1. Permanent atrial fibrillation ([place])   2. Diastolic CHF, chronic ([place])   3. Anticoagulant long-term use   4. Moderate to severe pulmonary hypertension ([place])   5. Bilateral leg edema   6. Essential hypertension    PLAN:    Peripheral edema 2/2 severe pulmonary hypertension and chronic diastolic heart failure -has stabilized significantly. Daughter very involved in her care. Weights and symptoms stable. -Continue bumex and  spironolactone as noted -continue daily weights, leg elevation, compression stockings, salt avoidance -we have extensively discussed her pulmonary hypertension, see prior notes -overall leg swelling significantly improved since when I first met her. They feel there is a good balance of avoiding swelling and allowing her to have good quality of life (diet, frequency of urination, etc).  Hypertension: at goal -continue diuretics as above -metoprolol succinate as below for rate control -continue losartan 100 mg daily  Atrial fibrillation, permanent -CHA2DS2/VAS=at least 5 -on coumadin, managed per PCP.  -tolerating metoprolol succinate 50 mg daily, monitor  Plan for follow up: 6 mos or sooner as needed  Megan Dresser, MD, PhD, [facility]   Medication Adjustments/Labs and Tests Ordered: Current medicines are reviewed at length with the patient today.  Concerns regarding medicines are outlined above.  Orders Placed This Encounter  Procedures  . EKG 12-Lead   No orders of the defined types were placed in this encounter.   Patient Instructions  Medication Instructions:  Your Physician  recommend you continue on your current medication as directed.    *If you need a refill on your cardiac medications before your next appointment, please call your pharmacy*   Lab Work: None ordered today   Testing/Procedures: None ordered   Follow-Up: At [facility], you and your health needs are our priority.  As part of our continuing mission to provide you with exceptional heart care, we have created designated Provider Care Teams.  These Care Teams include your primary Cardiologist (physician) and Advanced Practice Providers (APPs -  Physician Assistants and Nurse Practitioners) who all work together to provide you with the care you need, when you need it.  We recommend signing up for the patient portal called "MyChart".  Sign up information is provided on  this After Visit Summary.  MyChart is used to connect with patients for Virtual Visits (Telemedicine).  Patients are able to view lab/test results, encounter notes, upcoming appointments, etc.  Non-urgent messages can be sent to your provider as well.   To learn more about what you can do with MyChart, go to [email or web address].    Your next appointment:   6 month(s) @ [address] [place], [place] [address]   The format for your next appointment:   In Person  Provider:   Buford Dresser, MD       [facility] Stumpf,acting as a scribe for Megan Dresser, MD.,have documented all relevant documentation on the behalf of Megan Dresser, MD,as directed by  Megan Dresser, MD while in the presence of Megan Dresser, MD.  I, Megan Dresser, MD, have reviewed all documentation for this visit. The documentation on 02/12/21 for the exam, diagnosis, procedures, and orders are all accurate and complete.  Signed, Megan Dresser, MD PhD 02/05/2021  [facility]

## 2021-02-12 ENCOUNTER — Encounter: Payer: Self-pay | Admitting: Cardiology

## 2021-02-27 ENCOUNTER — Encounter (INDEPENDENT_AMBULATORY_CARE_PROVIDER_SITE_OTHER): Payer: Medicare HMO | Admitting: Ophthalmology

## 2021-04-02 NOTE — Progress Notes (Signed)
[facility] Note  04/03/2021     CHIEF COMPLAINT Patient presents for Retina Follow Up   HISTORY OF PRESENT ILLNESS: [name] is a 87 y.o. female who presents to the clinic today for:   HPI     Retina Follow Up   Patient presents with  Dry AMD.  In both eyes.  Duration of 6 months.  Since onset it is stable.  I, the attending physician,  performed the HPI with the patient and updated documentation appropriately.        Comments   Pt here for 6 mo ret f/u for non exu ARMD OU. Pt states vision is about the same, no major changes noticed.       Last edited by Bernarda Caffey, MD on 04/04/2021 10:47 PM.     Referring physician: Maurice Small, MD [address],  [place] [address]  HISTORICAL INFORMATION:   Selected notes from the MEDICAL RECORD NUMBER Referred by Dr. Frederico Hamman for ret eval OU   CURRENT MEDICATIONS: No current outpatient medications on file. (Ophthalmic Drugs)   No current facility-administered medications for this visit. (Ophthalmic Drugs)   Current Outpatient Medications (Other)  Medication Sig   bumetanide (BUMEX) 1 MG tablet TAKE 1 TABLET BY MOUTH EVERY DAY (Patient taking differently: Take 1 mg by mouth daily.)   cholecalciferol (VITAMIN D3) 25 MCG (1000 UT) tablet Take 1,000 Units by mouth 2 (two) times daily.    gabapentin (NEURONTIN) 300 MG capsule Take 300 mg by mouth 2 (two) times daily.    levothyroxine (SYNTHROID) 112 MCG tablet Take 112 mcg by mouth daily before breakfast.    losartan (COZAAR) 100 MG tablet Take 100 mg by mouth daily.   metoprolol succinate (TOPROL-XL) 50 MG 24 hr tablet Take 1 tablet (50 mg total) by mouth daily. Take with or immediately following a meal.   Multiple Vitamins-Minerals (PRESERVISION/LUTEIN) CAPS Take 1 capsule by mouth 2 (two) times daily.    pantoprazole (PROTONIX) 40 MG tablet Take 40 mg by mouth daily.   spironolactone (ALDACTONE) 25 MG tablet TAKE 1  TABLET BY MOUTH EVERY MORNING WITH BREAKFAST   vitamin B-12 (CYANOCOBALAMIN) 500 MCG tablet Take 500 mcg by mouth daily.   warfarin (COUMADIN) 1 MG tablet Take 1-2 mg by mouth See admin instructions. Take 1 tablet (25m) by mouth on Monday and Friday. All other days take 2 tablets (240m   No current facility-administered medications for this visit. (Other)      REVIEW OF SYSTEMS: ROS   Positive for: Eyes Negative for: Constitutional, Gastrointestinal, Neurological, Skin, Genitourinary, Musculoskeletal, HENT, Endocrine, Cardiovascular, Respiratory, Psychiatric, Allergic/Imm, Heme/Lymph Last edited by SiKingsley SpittleCOT on 04/03/2021  2:38 PM.        ALLERGIES Allergies  Allergen Reactions   Acetaminophen    Amoxicillin    Codeine    Erythromycin    Iodine    Oxycodone    Oxycodone-Acetaminophen    Penicillins    Sulfasalazine    Tramadol     PAST MEDICAL HISTORY Past Medical History:  Diagnosis Date   Anticoagulant long-term use    Arrhythmia    Arthritis    Atrial fibrillation ([facility])    Congestive heart failure (CHF) ([facility])    Dysphagia    Esophageal reflux    Essential hypertension    Hiatal hernia    Pulmonary embolus ([facility]   Thyroid disease    Past Surgical History:  Procedure  Laterality Date   COLONOSCOPY     ELBOW SURGERY     LAPAROSCOPIC HYSTERECTOMY      FAMILY HISTORY Family History  Problem Relation Age of Onset   Stroke Mother    Cancer Brother     SOCIAL HISTORY Social History   Tobacco Use   Smoking status: Former    Packs/day: 1.00    Types: Cigarettes    Quit date: 10/02/2001    Years since quitting: 19.5   Smokeless tobacco: Never   Tobacco comments:    former smoker         OPHTHALMIC EXAM:  Base Eye Exam     Visual Acuity (Snellen - Linear)       Right Left   Dist cc 20/60 -2 20/50 -2   Dist ph cc 20/50 -1 20/50 +2    Correction: Glasses         Tonometry (Tonopen, 2:53 PM)       Right Left   Pressure  10 12         Pupils       Dark Light Shape React APD   Right 4 3 Round Brisk None   Left 4 3 Round Brisk None         Visual Fields (Counting fingers)       Left Right    Full Full         Extraocular Movement       Right Left    Full, Ortho Full, Ortho         Neuro/Psych     Oriented x3: Yes   Mood/Affect: Normal         Dilation     Both eyes: 1.0% Mydriacyl, 2.5% Phenylephrine @ 2:54 PM           Slit Lamp and Fundus Exam     Slit Lamp Exam       Right Left   Lids/Lashes Dermatochalasis - upper lid, mild Meibomian gland dysfunction Dermatochalasis - upper lid, mild Meibomian gland dysfunction   Conjunctiva/Sclera White and quiet White and quiet   Cornea arcus, 2+ Punctate epithelial erosions, irregular tear film, decreased TBUT, mild central haze arcus, 2-3+ Punctate epithelial erosions, irregular tear film, decreased TBUT, mild central haze   Anterior Chamber Deep and clear, narrow temporal angle Deep and clear, narrow temporal angle   Iris Round and dilated Round and dilated   Lens PC IOL in good position with open PC PC IOL in good position, 1-2+ Posterior capsular opacification   Vitreous Vitreous syneresis Vitreous syneresis         Fundus Exam       Right Left   Disc 360 PPA, mld Pallor, Sharp rim mild Pallor, Sharp rim   C/D Ratio 0.3 0.3   Macula Flat, Blunted foveal reflex, RPE mottling, clumping and atrophy, Drusen, mild progression of GA, No heme or edema Flat, Blunted foveal reflex, RPE mottling, clumping and atrophy, +GA--mild progression, No heme or edema   Vessels Vascular attenuation, mild tortuousity Vascular attenuation, Tortuous   Periphery Attached, reticular degeneration, No heme  Attached, mild reticular degeneration, No heme            Refraction     Wearing Rx       Sphere Cylinder Axis   Right -1.50 +1.50 005   Left -0.25 +1.50 175            IMAGING AND PROCEDURES  Imaging and Procedures for  04/03/2021  OCT, Retina - OU - Both Eyes       Right Eye Quality was borderline. Central Foveal Thickness: 226. Progression has worsened. Findings include normal foveal contour, no IRF, no SRF, retinal drusen , intraretinal hyper-reflective material, outer retinal atrophy (Mild interval progression of ORA/GA).   Left Eye Quality was borderline. Central Foveal Thickness: 268. Progression has been stable. Findings include normal foveal contour, no IRF, no SRF, retinal drusen , outer retinal atrophy, intraretinal hyper-reflective material (Mild Interval progression of ORA/GA).   Notes *Images captured and stored on drive  Diagnosis / Impression:  Nonexudative ARMD OU Focal peripapillary ORA OU Mild interval progression of ORA/GA OU No IRF/SRF OU  Clinical management:  See below  Abbreviations: NFP - Normal foveal profile. CME - cystoid macular edema. PED - pigment epithelial detachment. IRF - intraretinal fluid. SRF - subretinal fluid. EZ - ellipsoid zone. ERM - epiretinal membrane. ORA - outer retinal atrophy. ORT - outer retinal tubulation. SRHM - subretinal hyper-reflective material. IRHM - intraretinal hyper-reflective material            ASSESSMENT/PLAN:    ICD-10-CM   1. Advanced atrophic nonexudative age-related macular degeneration of both eyes without subfoveal involvement  H35.3133     2. Retinal edema  H35.81 OCT, Retina - OU - Both Eyes    3. Essential hypertension  I10     4. Hypertensive retinopathy of both eyes  H35.033     5. Pseudophakia, both eyes  Z96.1     6. PCO (posterior capsular opacification), left  H26.492     7. Dry eyes  H04.123      1,2. Age related macular degeneration, non-exudative, with +GA, both eyes (OS>OD)  - focal peripapillary ORA OU  - OCT shows mild interval progression of GA/ORA OU at 8 mo interval  - BCVA 20/50-1 OD; 20/50+2 OS -- decreased OU  - The incidence, anatomy, and pathology of dry AMD, risk of progression, and the  AREDS and AREDS 2 study including smoking risks discussed with patient.  - Cont amsler grid monitoring  - f/u 6-9 months, DFE, OCT  3,4. Hypertensive retinopathy OU - discussed importance of tight BP control - monitor  5. Pseudophakia OU  - s/p CE/IOL OU  - IOLs in good position  - s/p YAG Cap OD (Dr. Frederico Hamman) - monitor  6. PCO OS  - mild  7. Dry eyes OU  - recommend artificial tears and lubricating ointment as needed  Ophthalmic Meds Ordered this visit:  No orders of the defined types were placed in this encounter.      Return in about 6 months (around 10/04/2021) for 6-9 months non exu ARMD OU.  There are no Patient Instructions on file for this visit.   Explained the diagnoses, plan, and follow up with the patient and they expressed understanding.  Patient expressed understanding of the importance of proper follow up care.  This document serves as a record of services personally performed by Gardiner Sleeper, MD, PhD. It was created on their behalf by Roselee Nova, COMT. The creation of this record is the provider's dictation and/or activities during the visit.  Electronically signed by: Roselee Nova, COMT 04/04/21 10:54 PM  This document serves as a record of services personally performed by Gardiner Sleeper, MD, PhD. It was created on their behalf by Leeann Must, [place], an ophthalmic technician. The creation of this record is the provider's dictation and/or activities during the visit.    Electronically signed by: Caryl Pina  English, COA _0 @ 10:54 PM  Gardiner Sleeper, M.D., Ph.D. Diseases & Surgery of the Retina and Vitreous [facility]  I have reviewed the above documentation for accuracy and completeness, and I agree with the above. Gardiner Sleeper, M.D., Ph.D. 04/04/21 10:54 PM  Abbreviations: M myopia (nearsighted); A astigmatism; H hyperopia (farsighted); P presbyopia; Mrx spectacle prescription;  CTL contact lenses; OD right eye; OS left  eye; OU both eyes  XT exotropia; ET esotropia; PEK punctate epithelial keratitis; PEE punctate epithelial erosions; DES dry eye syndrome; MGD meibomian gland dysfunction; ATs artificial tears; PFAT's preservative free artificial tears; [place] nuclear sclerotic cataract; PSC posterior subcapsular cataract; ERM epi-retinal membrane; PVD posterior vitreous detachment; RD retinal detachment; DM diabetes mellitus; DR diabetic retinopathy; NPDR non-proliferative diabetic retinopathy; PDR proliferative diabetic retinopathy; CSME clinically significant macular edema; DME diabetic macular edema; dbh dot blot hemorrhages; CWS cotton wool spot; POAG primary open angle glaucoma; C/D cup-to-disc ratio; HVF humphrey visual field; GVF goldmann visual field; OCT optical coherence tomography; IOP intraocular pressure; BRVO Branch retinal vein occlusion; CRVO central retinal vein occlusion; CRAO central retinal artery occlusion; BRAO branch retinal artery occlusion; RT retinal tear; SB scleral buckle; PPV pars plana vitrectomy; VH Vitreous hemorrhage; PRP panretinal laser photocoagulation; IVK intravitreal kenalog; VMT vitreomacular traction; MH Macular hole;  NVD neovascularization of the disc; NVE neovascularization elsewhere; AREDS age related eye disease study; ARMD age related macular degeneration; POAG primary open angle glaucoma; EBMD epithelial/anterior basement membrane dystrophy; ACIOL anterior chamber intraocular lens; IOL intraocular lens; PCIOL posterior chamber intraocular lens; Phaco/IOL phacoemulsification with intraocular lens placement; [place] photorefractive keratectomy; LASIK laser assisted in situ keratomileusis; HTN hypertension; DM diabetes mellitus; COPD chronic obstructive pulmonary disease

## 2021-04-03 ENCOUNTER — Encounter (INDEPENDENT_AMBULATORY_CARE_PROVIDER_SITE_OTHER): Payer: Self-pay | Admitting: Ophthalmology

## 2021-04-03 ENCOUNTER — Other Ambulatory Visit: Payer: Self-pay

## 2021-04-03 ENCOUNTER — Ambulatory Visit (INDEPENDENT_AMBULATORY_CARE_PROVIDER_SITE_OTHER): Payer: Medicare HMO | Admitting: Ophthalmology

## 2021-04-03 DIAGNOSIS — I10 Essential (primary) hypertension: Secondary | ICD-10-CM

## 2021-04-03 DIAGNOSIS — H3581 Retinal edema: Secondary | ICD-10-CM

## 2021-04-03 DIAGNOSIS — H35033 Hypertensive retinopathy, bilateral: Secondary | ICD-10-CM | POA: Diagnosis not present

## 2021-04-03 DIAGNOSIS — H26492 Other secondary cataract, left eye: Secondary | ICD-10-CM

## 2021-04-03 DIAGNOSIS — Z961 Presence of intraocular lens: Secondary | ICD-10-CM

## 2021-04-03 DIAGNOSIS — H353133 Nonexudative age-related macular degeneration, bilateral, advanced atrophic without subfoveal involvement: Secondary | ICD-10-CM | POA: Diagnosis not present

## 2021-04-03 DIAGNOSIS — H04123 Dry eye syndrome of bilateral lacrimal glands: Secondary | ICD-10-CM

## 2021-04-04 ENCOUNTER — Encounter (INDEPENDENT_AMBULATORY_CARE_PROVIDER_SITE_OTHER): Payer: Self-pay | Admitting: Ophthalmology

## 2021-05-05 ENCOUNTER — Other Ambulatory Visit: Payer: Self-pay | Admitting: Cardiology

## 2021-05-05 DIAGNOSIS — I4821 Permanent atrial fibrillation: Secondary | ICD-10-CM

## 2021-06-19 ENCOUNTER — Other Ambulatory Visit: Payer: Self-pay | Admitting: Cardiology

## 2021-06-19 DIAGNOSIS — I5033 Acute on chronic diastolic (congestive) heart failure: Secondary | ICD-10-CM

## 2021-06-19 NOTE — Telephone Encounter (Signed)
Rx(s) sent to pharmacy electronically.  

## 2021-07-24 ENCOUNTER — Other Ambulatory Visit: Payer: Self-pay | Admitting: Cardiology

## 2021-09-30 NOTE — Progress Notes (Shared)
[facility] Note  10/02/2021     CHIEF COMPLAINT Patient presents for No chief complaint on file.    HISTORY OF PRESENT ILLNESS: [name] is a 88 y.o. female who presents to the clinic today for:     Referring physician: Maurice Small, MD [address],  [place] [address]  HISTORICAL INFORMATION:   Selected notes from the MEDICAL RECORD NUMBER Referred by Dr. Frederico Hamman for ret eval OU   CURRENT MEDICATIONS: No current outpatient medications on file. (Ophthalmic Drugs)   No current facility-administered medications for this visit. (Ophthalmic Drugs)   Current Outpatient Medications (Other)  Medication Sig   bumetanide (BUMEX) 1 MG tablet TAKE 1 TABLET BY MOUTH EVERY DAY   cholecalciferol (VITAMIN D3) 25 MCG (1000 UT) tablet Take 1,000 Units by mouth 2 (two) times daily.    gabapentin (NEURONTIN) 300 MG capsule Take 300 mg by mouth 2 (two) times daily.    levothyroxine (SYNTHROID) 112 MCG tablet Take 112 mcg by mouth daily before breakfast.    losartan (COZAAR) 100 MG tablet Take 100 mg by mouth daily.   metoprolol succinate (TOPROL-XL) 50 MG 24 hr tablet TAKE 1 TABLET BY MOUTH DAILY. TAKE WITH OR IMMEDIATELY FOLLOWING A MEAL.   Multiple Vitamins-Minerals (PRESERVISION/LUTEIN) CAPS Take 1 capsule by mouth 2 (two) times daily.    pantoprazole (PROTONIX) 40 MG tablet Take 40 mg by mouth daily.   spironolactone (ALDACTONE) 25 MG tablet TAKE 1 TABLET BY MOUTH EVERY MORNING WITH BREAKFAST   vitamin B-12 (CYANOCOBALAMIN) 500 MCG tablet Take 500 mcg by mouth daily.   warfarin (COUMADIN) 1 MG tablet Take 1-2 mg by mouth See admin instructions. Take 1 tablet ($RemoveB'1mg'rDoPzRCp$ ) by mouth on Monday and Friday. All other days take 2 tablets ($RemoveBe'2mg'MadFAhZJq$ )   No current facility-administered medications for this visit. (Other)      REVIEW OF SYSTEMS:      ALLERGIES Allergies  Allergen Reactions   Acetaminophen    Amoxicillin    Codeine     Erythromycin    Iodine    Oxycodone    Oxycodone-Acetaminophen    Penicillins    Sulfasalazine    Tramadol     PAST MEDICAL HISTORY Past Medical History:  Diagnosis Date   Anticoagulant long-term use    Arrhythmia    Arthritis    Atrial fibrillation (HCC)    Congestive heart failure (CHF) (HCC)    Dysphagia    Esophageal reflux    Essential hypertension    Hiatal hernia    Pulmonary embolus (HCC)    Thyroid disease    Past Surgical History:  Procedure Laterality Date   COLONOSCOPY     ELBOW SURGERY     LAPAROSCOPIC HYSTERECTOMY      FAMILY HISTORY Family History  Problem Relation Age of Onset   Stroke Mother    Cancer Brother     SOCIAL HISTORY Social History   Tobacco Use   Smoking status: Former    Packs/day: 1.00    Types: Cigarettes    Quit date: 10/02/2001    Years since quitting: 20.0   Smokeless tobacco: Never   Tobacco comments:    former smoker         OPHTHALMIC EXAM:  Not recorded     IMAGING AND PROCEDURES  Imaging and Procedures for 10/02/2021          ASSESSMENT/PLAN:    ICD-10-CM   1. Advanced atrophic nonexudative age-related  macular degeneration of both eyes without subfoveal involvement  H35.3133     2. Essential hypertension  I10     3. Hypertensive retinopathy of both eyes  H35.033     4. Pseudophakia, both eyes  Z96.1     5. PCO (posterior capsular opacification), left  H26.492     6. Dry eyes  H04.123      1,2. Age related macular degeneration, non-exudative, with +GA, both eyes (OS>OD)  - focal peripapillary ORA OU  - OCT shows mild interval progression of GA/ORA OU at 8 mo interval  - BCVA 20/50-1 OD; 20/50+2 OS -- decreased OU  - The incidence, anatomy, and pathology of dry AMD, risk of progression, and the AREDS and AREDS 2 study including smoking risks discussed with patient.  - Cont amsler grid monitoring  - f/u 6-9 months, DFE, OCT  3,4. Hypertensive retinopathy OU - discussed importance of tight  BP control - monitor  5. Pseudophakia OU  - s/p CE/IOL OU  - IOLs in good position  - s/p YAG Cap OD (Dr. Frederico Hamman) - monitor  6. PCO OS  - mild  7. Dry eyes OU  - recommend artificial tears and lubricating ointment as needed  Ophthalmic Meds Ordered this visit:  No orders of the defined types were placed in this encounter.       No follow-ups on file.  There are no Patient Instructions on file for this visit.   Explained the diagnoses, plan, and follow up with the patient and they expressed understanding.  Patient expressed understanding of the importance of proper follow up care.  This document serves as a record of services personally performed by Gardiner Sleeper, MD, PhD. It was created on their behalf by San Jetty. Owens Shark, OA an ophthalmic technician. The creation of this record is the provider's dictation and/or activities during the visit.    Electronically signed by: San Jetty. Owens Shark, [place] 01.23.2023 12:26 PM   Gardiner Sleeper, M.D., Ph.D. Diseases & Surgery of the Retina and Vitreous [facility]: M myopia (nearsighted); A astigmatism; H hyperopia (farsighted); P presbyopia; Mrx spectacle prescription;  CTL contact lenses; OD right eye; OS left eye; OU both eyes  XT exotropia; ET esotropia; PEK punctate epithelial keratitis; PEE punctate epithelial erosions; DES dry eye syndrome; MGD meibomian gland dysfunction; ATs artificial tears; PFAT's preservative free artificial tears; [place] nuclear sclerotic cataract; PSC posterior subcapsular cataract; ERM epi-retinal membrane; PVD posterior vitreous detachment; RD retinal detachment; DM diabetes mellitus; DR diabetic retinopathy; NPDR non-proliferative diabetic retinopathy; PDR proliferative diabetic retinopathy; CSME clinically significant macular edema; DME diabetic macular edema; dbh dot blot hemorrhages; CWS cotton wool spot; POAG primary open angle glaucoma; C/D cup-to-disc ratio; HVF  humphrey visual field; GVF goldmann visual field; OCT optical coherence tomography; IOP intraocular pressure; BRVO Branch retinal vein occlusion; CRVO central retinal vein occlusion; CRAO central retinal artery occlusion; BRAO branch retinal artery occlusion; RT retinal tear; SB scleral buckle; PPV pars plana vitrectomy; VH Vitreous hemorrhage; PRP panretinal laser photocoagulation; IVK intravitreal kenalog; VMT vitreomacular traction; MH Macular hole;  NVD neovascularization of the disc; NVE neovascularization elsewhere; AREDS age related eye disease study; ARMD age related macular degeneration; POAG primary open angle glaucoma; EBMD epithelial/anterior basement membrane dystrophy; ACIOL anterior chamber intraocular lens; IOL intraocular lens; PCIOL posterior chamber intraocular lens; Phaco/IOL phacoemulsification with intraocular lens placement; [place] photorefractive keratectomy; LASIK laser assisted in situ keratomileusis; HTN hypertension; DM diabetes mellitus; COPD chronic obstructive pulmonary disease

## 2021-10-02 ENCOUNTER — Encounter (INDEPENDENT_AMBULATORY_CARE_PROVIDER_SITE_OTHER): Payer: Medicare HMO | Admitting: Ophthalmology

## 2021-10-02 DIAGNOSIS — Z961 Presence of intraocular lens: Secondary | ICD-10-CM

## 2021-10-02 DIAGNOSIS — H353133 Nonexudative age-related macular degeneration, bilateral, advanced atrophic without subfoveal involvement: Secondary | ICD-10-CM

## 2021-10-02 DIAGNOSIS — I10 Essential (primary) hypertension: Secondary | ICD-10-CM

## 2021-10-02 DIAGNOSIS — H26492 Other secondary cataract, left eye: Secondary | ICD-10-CM

## 2021-10-02 DIAGNOSIS — H35033 Hypertensive retinopathy, bilateral: Secondary | ICD-10-CM

## 2021-10-02 DIAGNOSIS — H04123 Dry eye syndrome of bilateral lacrimal glands: Secondary | ICD-10-CM

## 2021-10-09 NOTE — Progress Notes (Signed)
[facility] Note  10/11/2021     CHIEF COMPLAINT Patient presents for Retina Follow Up    HISTORY OF PRESENT ILLNESS: [name] is a 88 y.o. female who presents to the clinic today for:   HPI     Retina Follow Up   Patient presents with  Dry AMD.  In both eyes.  This started 6 months ago.  I, the attending physician,  performed the HPI with the patient and updated documentation appropriately.        Comments   Patient here for 6 month retina follow up for non exu ARMD OU. Patient states vision is so, so. No eye pain.       Last edited by Bernarda Caffey, MD on 10/15/2021 12:53 AM.     Patient states vision is so, so. Is being evaluated for UTI by primary care.    Referring physician: Maurice Small, MD [address],  [place] [address]  HISTORICAL INFORMATION:   Selected notes from the MEDICAL RECORD NUMBER Referred by Dr. Frederico Hamman for ret eval OU   CURRENT MEDICATIONS: No current outpatient medications on file. (Ophthalmic Drugs)   No current facility-administered medications for this visit. (Ophthalmic Drugs)   Current Outpatient Medications (Other)  Medication Sig   bumetanide (BUMEX) 1 MG tablet TAKE 1 TABLET BY MOUTH EVERY DAY   cholecalciferol (VITAMIN D3) 25 MCG (1000 UT) tablet Take 1,000 Units by mouth 2 (two) times daily.    gabapentin (NEURONTIN) 300 MG capsule Take 300 mg by mouth 2 (two) times daily.    levothyroxine (SYNTHROID) 112 MCG tablet Take 112 mcg by mouth daily before breakfast.    losartan (COZAAR) 100 MG tablet Take 100 mg by mouth daily.   metoprolol succinate (TOPROL-XL) 50 MG 24 hr tablet TAKE 1 TABLET BY MOUTH DAILY. TAKE WITH OR IMMEDIATELY FOLLOWING A MEAL.   Multiple Vitamins-Minerals (PRESERVISION/LUTEIN) CAPS Take 1 capsule by mouth 2 (two) times daily.    pantoprazole (PROTONIX) 40 MG tablet Take 40 mg by mouth daily.   spironolactone (ALDACTONE) 25 MG tablet TAKE 1 TABLET BY  MOUTH EVERY MORNING WITH BREAKFAST   vitamin B-12 (CYANOCOBALAMIN) 500 MCG tablet Take 500 mcg by mouth daily.   warfarin (COUMADIN) 1 MG tablet Take 1-2 mg by mouth See admin instructions. Take 1 tablet ($RemoveB'1mg'lTEuqxSl$ ) by mouth on Monday and Friday. All other days take 2 tablets ($RemoveBe'2mg'lTDUvTXTH$ )   No current facility-administered medications for this visit. (Other)   REVIEW OF SYSTEMS: ROS   Positive for: Eyes Negative for: Constitutional, Gastrointestinal, Neurological, Skin, Genitourinary, Musculoskeletal, HENT, Endocrine, Cardiovascular, Respiratory, Psychiatric, Allergic/Imm, Heme/Lymph Last edited by Theodore Demark, COA on 10/11/2021  2:08 PM.     ALLERGIES Allergies  Allergen Reactions   Acetaminophen    Amoxicillin    Codeine    Erythromycin    Iodine    Oxycodone    Oxycodone-Acetaminophen    Penicillins    Sulfasalazine    Tramadol    PAST MEDICAL HISTORY Past Medical History:  Diagnosis Date   Anticoagulant long-term use    Arrhythmia    Arthritis    Atrial fibrillation (HCC)    Congestive heart failure (CHF) (HCC)    Dysphagia    Esophageal reflux    Essential hypertension    Hiatal hernia    Pulmonary embolus ([place])    Thyroid disease    Past Surgical History:  Procedure Laterality Date   COLONOSCOPY  ELBOW SURGERY     LAPAROSCOPIC HYSTERECTOMY     FAMILY HISTORY Family History  Problem Relation Age of Onset   Stroke Mother    Cancer Brother    SOCIAL HISTORY Social History   Tobacco Use   Smoking status: Former    Packs/day: 1.00    Types: Cigarettes    Quit date: 10/02/2001    Years since quitting: 20.0   Smokeless tobacco: Never   Tobacco comments:    former smoker  Vaping Use   Vaping Use: Never used       OPHTHALMIC EXAM:  Base Eye Exam     Visual Acuity (Snellen - Linear)       Right Left   Dist cc 20/60 -2 20/40   Dist ph cc 20/50 -2 20/40 +2    Correction: Glasses         Tonometry (Tonopen, 2:06 PM)       Right Left    Pressure 06 07         Pupils       Dark Light Shape React APD   Right 4 3 Round Brisk None   Left 4 3 Round Brisk None         Visual Fields (Counting fingers)       Left Right    Full Full         Extraocular Movement       Right Left    Full, Ortho Full, Ortho         Neuro/Psych     Oriented x3: Yes   Mood/Affect: Normal         Dilation     Both eyes: 1.0% Mydriacyl, 2.5% Phenylephrine @ 2:05 PM           Slit Lamp and Fundus Exam     Slit Lamp Exam       Right Left   Lids/Lashes Dermatochalasis - upper lid, mild Meibomian gland dysfunction Dermatochalasis - upper lid, mild Meibomian gland dysfunction   Conjunctiva/Sclera White and quiet White and quiet   Cornea arcus, 1-2+ Punctate epithelial erosions, irregular tear film, decreased TBUT, mild central haze, 2+ guttata, 1-2+, Descemet's folds arcus, 1-2+ Punctate epithelial erosions, irregular tear film, decreased TBUT, mild central haze, 1+ guttata, no edema   Anterior Chamber Deep and clear, narrow temporal angle Deep and clear, narrow temporal angle   Iris Round and dilated Round and dilated   Lens PC IOL in good position with open PC PC IOL in good position, 1-2+ Posterior capsular opacification   Anterior Vitreous Vitreous syneresis Vitreous syneresis         Fundus Exam       Right Left   Disc 360 PPA, mld Pallor, Sharp rim mild Pallor, Sharp rim   C/D Ratio 0.3 0.3   Macula Flat, Blunted foveal reflex, RPE mottling, clumping and atrophy, Drusen, mild progression of GA, No heme or edema Flat, Blunted foveal reflex, RPE mottling, clumping and atrophy, +GA--stable, No heme or edema   Vessels Vascular attenuation, mild tortuousity Vascular attenuation, Tortuous   Periphery Attached, reticular degeneration, No heme  Attached, mild reticular degeneration, No heme            Refraction     Wearing Rx       Sphere Cylinder Axis   Right -1.50 +1.50 005   Left -0.25 +1.50 175             IMAGING AND PROCEDURES  Imaging and  Procedures for 10/11/2021  OCT, Retina - OU - Both Eyes       Right Eye Quality was good. Central Foveal Thickness: 231. Progression has worsened. Findings include normal foveal contour, no IRF, no SRF, retinal drusen , intraretinal hyper-reflective material, outer retinal atrophy (Mild interval progression of ORA/GA).   Left Eye Quality was good. Central Foveal Thickness: 222. Progression has worsened. Findings include normal foveal contour, no IRF, no SRF, retinal drusen , outer retinal atrophy, intraretinal hyper-reflective material (Mild Interval progression of ORA/GA).   Notes *Images captured and stored on drive  Diagnosis / Impression:  Nonexudative ARMD OU Focal peripapillary ORA OU Mild interval progression of ORA/GA OU No IRF/SRF OU  Clinical management:  See below  Abbreviations: NFP - Normal foveal profile. CME - cystoid macular edema. PED - pigment epithelial detachment. IRF - intraretinal fluid. SRF - subretinal fluid. EZ - ellipsoid zone. ERM - epiretinal membrane. ORA - outer retinal atrophy. ORT - outer retinal tubulation. SRHM - subretinal hyper-reflective material. IRHM - intraretinal hyper-reflective material             ASSESSMENT/PLAN:    ICD-10-CM   1. Advanced atrophic nonexudative age-related macular degeneration of both eyes without subfoveal involvement  H35.3133 OCT, Retina - OU - Both Eyes    2. Essential hypertension  I10     3. Hypertensive retinopathy of both eyes  H35.033     4. Pseudophakia, both eyes  Z96.1     5. PCO (posterior capsular opacification), left  H26.492     6. Dry eyes  H04.123       1. Age related macular degeneration, non-exudative, with +GA, both eyes (OS>OD)  - focal peripapillary ORA OU  - OCT shows mild interval progression of GA/ORA OU at 6 mo interval  - BCVA 20/50-2 OD; 20/40+2 OS -- stable OD, improved OS  - The incidence, anatomy, and pathology of dry AMD,  risk of progression, and the AREDS and AREDS 2 study including smoking risks discussed with patient.   - Cont amsler grid monitoring  - f/u 6 months, DFE, OCT  2,3. Hypertensive retinopathy OU - discussed importance of tight BP control - monitor   4. Pseudophakia OU  - s/p CE/IOL OU  - IOLs in good position  - s/p YAG Cap OD (Dr. Frederico Hamman) - monitor   5. PCO OS  - mild  - monitor  6. Dry eyes OU  - recommend artificial tears and lubricating ointment as needed   Ophthalmic Meds Ordered this visit:  No orders of the defined types were placed in this encounter.    Return in about 6 months (around 04/10/2022) for DFE, OCT.  There are no Patient Instructions on file for this visit.   Explained the diagnoses, plan, and follow up with the patient and they expressed understanding.  Patient expressed understanding of the importance of proper follow up care.  This document serves as a record of services personally performed by Gardiner Sleeper, MD, PhD. It was created on their behalf by Leonie Douglas, an ophthalmic technician. The creation of this record is the provider's dictation and/or activities during the visit.    Electronically signed by: Leonie Douglas COA, 10/15/21  1:00 AM  This document serves as a record of services personally performed by Gardiner Sleeper, MD, PhD. It was created on their behalf by Roselee Nova, COMT. The creation of this record is the provider's dictation and/or activities during the visit.  Electronically signed by: Roselee Nova,  COMT 10/15/21 1:00 AM  Gardiner Sleeper, M.D., Ph.D. Diseases & Surgery of the Retina and [place] 10/11/2021  I have reviewed the above documentation for accuracy and completeness, and I agree with the above. Gardiner Sleeper, M.D., Ph.D. 10/15/21 1:04 AM   Abbreviations: M myopia (nearsighted); A astigmatism; H hyperopia (farsighted); P presbyopia; Mrx spectacle prescription;  CTL contact  lenses; OD right eye; OS left eye; OU both eyes  XT exotropia; ET esotropia; PEK punctate epithelial keratitis; PEE punctate epithelial erosions; DES dry eye syndrome; MGD meibomian gland dysfunction; ATs artificial tears; PFAT's preservative free artificial tears; [place] nuclear sclerotic cataract; PSC posterior subcapsular cataract; ERM epi-retinal membrane; PVD posterior vitreous detachment; RD retinal detachment; DM diabetes mellitus; DR diabetic retinopathy; NPDR non-proliferative diabetic retinopathy; PDR proliferative diabetic retinopathy; CSME clinically significant macular edema; DME diabetic macular edema; dbh dot blot hemorrhages; CWS cotton wool spot; POAG primary open angle glaucoma; C/D cup-to-disc ratio; HVF humphrey visual field; GVF goldmann visual field; OCT optical coherence tomography; IOP intraocular pressure; BRVO Branch retinal vein occlusion; CRVO central retinal vein occlusion; CRAO central retinal artery occlusion; BRAO branch retinal artery occlusion; RT retinal tear; SB scleral buckle; PPV pars plana vitrectomy; VH Vitreous hemorrhage; PRP panretinal laser photocoagulation; IVK intravitreal kenalog; VMT vitreomacular traction; MH Macular hole;  NVD neovascularization of the disc; NVE neovascularization elsewhere; AREDS age related eye disease study; ARMD age related macular degeneration; POAG primary open angle glaucoma; EBMD epithelial/anterior basement membrane dystrophy; ACIOL anterior chamber intraocular lens; IOL intraocular lens; PCIOL posterior chamber intraocular lens; Phaco/IOL phacoemulsification with intraocular lens placement; [place] photorefractive keratectomy; LASIK laser assisted in situ keratomileusis; HTN hypertension; DM diabetes mellitus; COPD chronic obstructive pulmonary disease

## 2021-10-11 ENCOUNTER — Ambulatory Visit (INDEPENDENT_AMBULATORY_CARE_PROVIDER_SITE_OTHER): Payer: Medicare HMO | Admitting: Ophthalmology

## 2021-10-11 ENCOUNTER — Encounter (INDEPENDENT_AMBULATORY_CARE_PROVIDER_SITE_OTHER): Payer: Self-pay | Admitting: Ophthalmology

## 2021-10-11 ENCOUNTER — Other Ambulatory Visit: Payer: Self-pay

## 2021-10-11 DIAGNOSIS — I10 Essential (primary) hypertension: Secondary | ICD-10-CM | POA: Diagnosis not present

## 2021-10-11 DIAGNOSIS — H35033 Hypertensive retinopathy, bilateral: Secondary | ICD-10-CM | POA: Diagnosis not present

## 2021-10-11 DIAGNOSIS — H353133 Nonexudative age-related macular degeneration, bilateral, advanced atrophic without subfoveal involvement: Secondary | ICD-10-CM | POA: Diagnosis not present

## 2021-10-11 DIAGNOSIS — Z961 Presence of intraocular lens: Secondary | ICD-10-CM | POA: Diagnosis not present

## 2021-10-11 DIAGNOSIS — H26492 Other secondary cataract, left eye: Secondary | ICD-10-CM

## 2021-10-11 DIAGNOSIS — H04123 Dry eye syndrome of bilateral lacrimal glands: Secondary | ICD-10-CM

## 2021-10-15 ENCOUNTER — Encounter (INDEPENDENT_AMBULATORY_CARE_PROVIDER_SITE_OTHER): Payer: Self-pay | Admitting: Ophthalmology

## 2021-11-09 ENCOUNTER — Other Ambulatory Visit: Payer: Self-pay | Admitting: Cardiology

## 2021-11-11 NOTE — Telephone Encounter (Signed)
Rx(s) sent to pharmacy electronically.  

## 2021-11-15 ENCOUNTER — Emergency Department (HOSPITAL_COMMUNITY): Payer: Medicare HMO

## 2021-11-15 ENCOUNTER — Observation Stay (HOSPITAL_COMMUNITY): Payer: Medicare HMO

## 2021-11-15 ENCOUNTER — Encounter (HOSPITAL_COMMUNITY): Payer: Self-pay

## 2021-11-15 ENCOUNTER — Other Ambulatory Visit: Payer: Self-pay

## 2021-11-15 ENCOUNTER — Inpatient Hospital Stay (HOSPITAL_COMMUNITY)
Admission: EM | Admit: 2021-11-15 | Discharge: 2021-11-21 | DRG: 504 | Disposition: A | Discharge status: Home / Self Care | Payer: Medicare HMO | Attending: Student | Admitting: Student

## 2021-11-15 DIAGNOSIS — I5022 Chronic systolic (congestive) heart failure: Secondary | ICD-10-CM

## 2021-11-15 DIAGNOSIS — I4821 Permanent atrial fibrillation: Secondary | ICD-10-CM | POA: Diagnosis present

## 2021-11-15 DIAGNOSIS — Z886 Allergy status to analgesic agent status: Secondary | ICD-10-CM

## 2021-11-15 DIAGNOSIS — M869 Osteomyelitis, unspecified: Secondary | ICD-10-CM | POA: Diagnosis not present

## 2021-11-15 DIAGNOSIS — D649 Anemia, unspecified: Secondary | ICD-10-CM

## 2021-11-15 DIAGNOSIS — L97819 Non-pressure chronic ulcer of other part of right lower leg with unspecified severity: Secondary | ICD-10-CM | POA: Diagnosis present

## 2021-11-15 DIAGNOSIS — N183 Chronic kidney disease, stage 3 unspecified: Secondary | ICD-10-CM

## 2021-11-15 DIAGNOSIS — K219 Gastro-esophageal reflux disease without esophagitis: Secondary | ICD-10-CM | POA: Diagnosis present

## 2021-11-15 DIAGNOSIS — I872 Venous insufficiency (chronic) (peripheral): Secondary | ICD-10-CM | POA: Diagnosis present

## 2021-11-15 DIAGNOSIS — I2724 Chronic thromboembolic pulmonary hypertension: Secondary | ICD-10-CM | POA: Diagnosis present

## 2021-11-15 DIAGNOSIS — L03115 Cellulitis of right lower limb: Secondary | ICD-10-CM | POA: Diagnosis not present

## 2021-11-15 DIAGNOSIS — Z87891 Personal history of nicotine dependence: Secondary | ICD-10-CM

## 2021-11-15 DIAGNOSIS — F419 Anxiety disorder, unspecified: Secondary | ICD-10-CM | POA: Diagnosis present

## 2021-11-15 DIAGNOSIS — Z882 Allergy status to sulfonamides status: Secondary | ICD-10-CM

## 2021-11-15 DIAGNOSIS — Z79890 Hormone replacement therapy (postmenopausal): Secondary | ICD-10-CM

## 2021-11-15 DIAGNOSIS — D696 Thrombocytopenia, unspecified: Secondary | ICD-10-CM

## 2021-11-15 DIAGNOSIS — N179 Acute kidney failure, unspecified: Secondary | ICD-10-CM

## 2021-11-15 DIAGNOSIS — R7881 Bacteremia: Secondary | ICD-10-CM

## 2021-11-15 DIAGNOSIS — D631 Anemia in chronic kidney disease: Secondary | ICD-10-CM | POA: Diagnosis present

## 2021-11-15 DIAGNOSIS — Z86711 Personal history of pulmonary embolism: Secondary | ICD-10-CM

## 2021-11-15 DIAGNOSIS — Z90710 Acquired absence of both cervix and uterus: Secondary | ICD-10-CM

## 2021-11-15 DIAGNOSIS — I130 Hypertensive heart and chronic kidney disease with heart failure and stage 1 through stage 4 chronic kidney disease, or unspecified chronic kidney disease: Secondary | ICD-10-CM | POA: Diagnosis present

## 2021-11-15 DIAGNOSIS — L97519 Non-pressure chronic ulcer of other part of right foot with unspecified severity: Secondary | ICD-10-CM | POA: Diagnosis present

## 2021-11-15 DIAGNOSIS — M79604 Pain in right leg: Secondary | ICD-10-CM | POA: Diagnosis not present

## 2021-11-15 DIAGNOSIS — D638 Anemia in other chronic diseases classified elsewhere: Secondary | ICD-10-CM

## 2021-11-15 DIAGNOSIS — E871 Hypo-osmolality and hyponatremia: Secondary | ICD-10-CM

## 2021-11-15 DIAGNOSIS — Z79899 Other long term (current) drug therapy: Secondary | ICD-10-CM

## 2021-11-15 DIAGNOSIS — R21 Rash and other nonspecific skin eruption: Secondary | ICD-10-CM | POA: Diagnosis present

## 2021-11-15 DIAGNOSIS — E039 Hypothyroidism, unspecified: Secondary | ICD-10-CM | POA: Diagnosis present

## 2021-11-15 DIAGNOSIS — A419 Sepsis, unspecified organism: Principal | ICD-10-CM

## 2021-11-15 DIAGNOSIS — J449 Chronic obstructive pulmonary disease, unspecified: Secondary | ICD-10-CM | POA: Diagnosis present

## 2021-11-15 DIAGNOSIS — Z66 Do not resuscitate: Secondary | ICD-10-CM | POA: Diagnosis present

## 2021-11-15 DIAGNOSIS — I071 Rheumatic tricuspid insufficiency: Secondary | ICD-10-CM | POA: Diagnosis present

## 2021-11-15 DIAGNOSIS — N1832 Chronic kidney disease, stage 3b: Secondary | ICD-10-CM

## 2021-11-15 DIAGNOSIS — I5032 Chronic diastolic (congestive) heart failure: Secondary | ICD-10-CM

## 2021-11-15 DIAGNOSIS — Z880 Allergy status to penicillin: Secondary | ICD-10-CM

## 2021-11-15 DIAGNOSIS — Z7901 Long term (current) use of anticoagulants: Secondary | ICD-10-CM

## 2021-11-15 DIAGNOSIS — E0590 Thyrotoxicosis, unspecified without thyrotoxic crisis or storm: Secondary | ICD-10-CM | POA: Insufficient documentation

## 2021-11-15 DIAGNOSIS — Z20822 Contact with and (suspected) exposure to covid-19: Secondary | ICD-10-CM | POA: Diagnosis present

## 2021-11-15 DIAGNOSIS — L0390 Cellulitis, unspecified: Secondary | ICD-10-CM

## 2021-11-15 DIAGNOSIS — Z91041 Radiographic dye allergy status: Secondary | ICD-10-CM

## 2021-11-15 DIAGNOSIS — M2041 Other hammer toe(s) (acquired), right foot: Secondary | ICD-10-CM | POA: Diagnosis present

## 2021-11-15 DIAGNOSIS — Z885 Allergy status to narcotic agent status: Secondary | ICD-10-CM

## 2021-11-15 DIAGNOSIS — D72829 Elevated white blood cell count, unspecified: Secondary | ICD-10-CM

## 2021-11-15 DIAGNOSIS — E785 Hyperlipidemia, unspecified: Secondary | ICD-10-CM | POA: Diagnosis present

## 2021-11-15 DIAGNOSIS — I5033 Acute on chronic diastolic (congestive) heart failure: Secondary | ICD-10-CM

## 2021-11-15 DIAGNOSIS — R531 Weakness: Secondary | ICD-10-CM

## 2021-11-15 DIAGNOSIS — I10 Essential (primary) hypertension: Secondary | ICD-10-CM | POA: Diagnosis present

## 2021-11-15 LAB — CBC WITH DIFFERENTIAL/PLATELET
Abs Immature Granulocytes: 0.05 10*3/uL (ref 0.00–0.07)
Basophils Absolute: 0 10*3/uL (ref 0.0–0.1)
Basophils Relative: 0 %
Eosinophils Absolute: 0.2 10*3/uL (ref 0.0–0.5)
Eosinophils Relative: 2 %
HCT: 35.9 % — ABNORMAL LOW (ref 36.0–46.0)
Hemoglobin: 11.7 g/dL — ABNORMAL LOW (ref 12.0–15.0)
Immature Granulocytes: 0 %
Lymphocytes Relative: 2 %
Lymphs Abs: 0.3 10*3/uL — ABNORMAL LOW (ref 0.7–4.0)
MCH: 30.3 pg (ref 26.0–34.0)
MCHC: 32.6 g/dL (ref 30.0–36.0)
MCV: 93 fL (ref 80.0–100.0)
Monocytes Absolute: 0.5 10*3/uL (ref 0.1–1.0)
Monocytes Relative: 4 %
Neutro Abs: 11.3 10*3/uL — ABNORMAL HIGH (ref 1.7–7.7)
Neutrophils Relative %: 92 %
Platelets: 174 10*3/uL (ref 150–400)
RBC: 3.86 MIL/uL — ABNORMAL LOW (ref 3.87–5.11)
RDW: 14.3 % (ref 11.5–15.5)
WBC: 12.4 10*3/uL — ABNORMAL HIGH (ref 4.0–10.5)
nRBC: 0 % (ref 0.0–0.2)

## 2021-11-15 LAB — COMPREHENSIVE METABOLIC PANEL
ALT: 20 U/L (ref 0–44)
AST: 30 U/L (ref 15–41)
Albumin: 3.3 g/dL — ABNORMAL LOW (ref 3.5–5.0)
Alkaline Phosphatase: 141 U/L — ABNORMAL HIGH (ref 38–126)
Anion gap: 9 (ref 5–15)
BUN: 24 mg/dL — ABNORMAL HIGH (ref 8–23)
CO2: 26 mmol/L (ref 22–32)
Calcium: 9 mg/dL (ref 8.9–10.3)
Chloride: 95 mmol/L — ABNORMAL LOW (ref 98–111)
Creatinine, Ser: 1.29 mg/dL — ABNORMAL HIGH (ref 0.44–1.00)
GFR, Estimated: 39 mL/min — ABNORMAL LOW (ref >60)
Glucose, Bld: 130 mg/dL — ABNORMAL HIGH (ref 70–99)
Potassium: 4.4 mmol/L (ref 3.5–5.1)
Sodium: 130 mmol/L — ABNORMAL LOW (ref 135–145)
Total Bilirubin: 0.8 mg/dL (ref 0.3–1.2)
Total Protein: 6.7 g/dL (ref 6.5–8.1)

## 2021-11-15 LAB — URINALYSIS, ROUTINE W REFLEX MICROSCOPIC
Bilirubin Urine: NEGATIVE
Glucose, UA: NEGATIVE mg/dL
Hgb urine dipstick: NEGATIVE
Ketones, ur: NEGATIVE mg/dL
Leukocytes,Ua: NEGATIVE
Nitrite: NEGATIVE
Protein, ur: 30 mg/dL — AB
Specific Gravity, Urine: 1.012 (ref 1.005–1.030)
pH: 5 (ref 5.0–8.0)

## 2021-11-15 LAB — RESP PANEL BY RT-PCR (FLU A&B, COVID) ARPGX2
Influenza A by PCR: NEGATIVE
Influenza B by PCR: NEGATIVE
SARS Coronavirus 2 by RT PCR: NEGATIVE

## 2021-11-15 LAB — PROTIME-INR
INR: 2.9 — ABNORMAL HIGH (ref 0.8–1.2)
Prothrombin Time: 30 seconds — ABNORMAL HIGH (ref 11.4–15.2)

## 2021-11-15 LAB — LACTIC ACID, PLASMA: Lactic Acid, Venous: 2.3 mmol/L (ref 0.5–1.9)

## 2021-11-15 MED ORDER — LACTATED RINGERS IV BOLUS (SEPSIS)
1000.0000 mL | Freq: Once | INTRAVENOUS | Status: AC
  Administered 2021-11-15: 1000 mL via INTRAVENOUS

## 2021-11-15 MED ORDER — ACETAMINOPHEN 650 MG RE SUPP
650.0000 mg | Freq: Four times a day (QID) | RECTAL | Status: DC | PRN
Start: 2021-11-15 — End: 2021-11-21

## 2021-11-15 MED ORDER — LACTATED RINGERS IV BOLUS (SEPSIS): 1000.0000 mL | Freq: Once | INTRAVENOUS | Status: DC

## 2021-11-15 MED ORDER — SODIUM CHLORIDE 0.9 % IV SOLN
2.0000 g | INTRAVENOUS | Status: DC
  Administered 2021-11-16 – 2021-11-17 (×2): 2 g via INTRAVENOUS
  Filled 2021-11-15 (×2): qty 2

## 2021-11-15 MED ORDER — SODIUM CHLORIDE 0.9 % IV SOLN
2.0000 g | Freq: Once | INTRAVENOUS | Status: AC
  Administered 2021-11-15: 2 g via INTRAVENOUS
  Filled 2021-11-15: qty 2

## 2021-11-15 MED ORDER — SODIUM CHLORIDE 0.9 % IV SOLN: 2.0000 g | Freq: Once | INTRAVENOUS | Status: DC

## 2021-11-15 MED ORDER — ACETAMINOPHEN 325 MG PO TABS
650.0000 mg | ORAL_TABLET | Freq: Four times a day (QID) | ORAL | Status: DC | PRN
  Administered 2021-11-16 – 2021-11-19 (×9): 650 mg via ORAL
  Filled 2021-11-15 (×9): qty 2

## 2021-11-15 MED ORDER — PANTOPRAZOLE SODIUM 40 MG PO TBEC
40.0000 mg | DELAYED_RELEASE_TABLET | Freq: Every day | ORAL | Status: DC
  Administered 2021-11-16 – 2021-11-21 (×6): 40 mg via ORAL
  Filled 2021-11-15 (×6): qty 1

## 2021-11-15 MED ORDER — VANCOMYCIN HCL IN DEXTROSE 1-5 GM/200ML-% IV SOLN: 1000.0000 mg | INTRAVENOUS | Status: DC

## 2021-11-15 MED ORDER — VANCOMYCIN HCL 2000 MG/400ML IV SOLN
2000.0000 mg | Freq: Once | INTRAVENOUS | Status: AC
  Administered 2021-11-16: 2000 mg via INTRAVENOUS
  Filled 2021-11-15: qty 400

## 2021-11-15 MED ORDER — LEVOTHYROXINE SODIUM 112 MCG PO TABS
112.0000 ug | ORAL_TABLET | Freq: Every day | ORAL | Status: DC
  Administered 2021-11-16 – 2021-11-21 (×6): 112 ug via ORAL
  Filled 2021-11-15 (×6): qty 1

## 2021-11-15 MED ORDER — LACTATED RINGERS IV SOLN: INTRAVENOUS | Status: DC

## 2021-11-15 MED ORDER — METRONIDAZOLE 500 MG/100ML IV SOLN
500.0000 mg | Freq: Once | INTRAVENOUS | Status: AC
  Administered 2021-11-16: 500 mg via INTRAVENOUS
  Filled 2021-11-15: qty 100

## 2021-11-15 MED ORDER — SPIRONOLACTONE 25 MG PO TABS
50.0000 mg | ORAL_TABLET | Freq: Every day | ORAL | Status: DC
  Administered 2021-11-16: 50 mg via ORAL
  Filled 2021-11-15 (×2): qty 2

## 2021-11-15 MED ORDER — SODIUM CHLORIDE 0.9% FLUSH
3.0000 mL | Freq: Two times a day (BID) | INTRAVENOUS | Status: DC
  Administered 2021-11-16 – 2021-11-20 (×8): 3 mL via INTRAVENOUS

## 2021-11-15 MED ORDER — METOPROLOL TARTRATE 50 MG PO TABS
50.0000 mg | ORAL_TABLET | Freq: Two times a day (BID) | ORAL | Status: DC
  Administered 2021-11-16 – 2021-11-17 (×4): 50 mg via ORAL
  Filled 2021-11-15: qty 2
  Filled 2021-11-15: qty 1
  Filled 2021-11-15: qty 2
  Filled 2021-11-15: qty 1

## 2021-11-15 MED ORDER — WARFARIN SODIUM 2 MG PO TABS
2.0000 mg | ORAL_TABLET | Freq: Once | ORAL | Status: DC
Start: 2021-11-16 — End: 2021-11-16
  Filled 2021-11-15: qty 1

## 2021-11-15 MED ORDER — WARFARIN - PHARMACIST DOSING INPATIENT: Freq: Every day | Status: DC

## 2021-11-15 MED ORDER — VANCOMYCIN HCL IN DEXTROSE 1-5 GM/200ML-% IV SOLN: 1000.0000 mg | Freq: Once | INTRAVENOUS | Status: DC

## 2021-11-15 MED ORDER — POLYETHYLENE GLYCOL 3350 17 G PO PACK: 17.0000 g | PACK | Freq: Every day | ORAL | Status: DC | PRN

## 2021-11-15 MED ORDER — LOSARTAN POTASSIUM 25 MG PO TABS
100.0000 mg | ORAL_TABLET | Freq: Every day | ORAL | Status: DC
  Administered 2021-11-16: 100 mg via ORAL
  Filled 2021-11-15 (×2): qty 4

## 2021-11-15 NOTE — H&P (Signed)
History and Physical   [name] IRJ:[id number] DOB: 03-24-1928 DOA: 11/15/2021  PCP: [name], MD   Patient coming from: Home  Chief Complaint: Leg pain, general weakness  HPI: [name] is a 88 y.o. female with medical history significant of A-fib, CHF, GERD, hypertension, pulmonary embolism, thyroid disease, COPD, pulmonary hypertension, anxiety, hyper lipidemia, venous insufficiency presenting with ongoing leg pain and swelling, with some generalized weakness and concern of possible UTI.  History obtained with assistance of daughter.  Patient currently lives with her daughter last week there was concern that she was also may be having symptoms of a UTI.  She was seen by her primary doctor and started on nitrofurantoin outpatient.  She has some worsening leg wound recently and noticed a rash around the wound.  Thought this may be due to the antibiotics.  Came in for further evaluation.  Does report generalized weakness and cough.  Denies fevers, chills, chest pain, shortness of breath, abdominal pain, constipation, diarrhea, nausea, vomiting.  ED Course: Vital signs in the ED significant for temperature of 100.2, heart rate in the 60s to 70s likely due to A-fib rhythm, blood pressure in the 301S to 010X systolic, respiratory rate in The 20s.  Lab work-up showed CMP with sodium 130, chloride 95, BUN 24, creatinine stable at 1.29, glucose 130, albumin 3.3, alk phos 141 which is stable.  CBC with hemoglobin stable at 11.7 and leukocytosis to 12.4.  PT and INR both elevated at 30 and 2.9 respectively.  Lactic acid elevated at 2.3.  Repeat pending.  Respiratory panel for flu and COVID-negative.  Urinalysis showing protein and rare bacteria only.  Blood cultures are pending.  Chest x-ray showed stable cardiomegaly and no other acute abnormality.  Tib-fib x-ray on the right showed no acute abnormality.  Right second toe x-ray showed changes suspicious for osteomyelitis with recommendation  of MRI if concern for osteomyelitis.  Patient received pain, cefepime, Flagyl in the ED as well as 3 L of fluids and started on a rate of fluids.  Review of Systems: As per HPI otherwise all other systems reviewed and are negative.  Past Medical History:  Diagnosis Date   Anticoagulant long-term use    Arrhythmia    Arthritis    Atrial fibrillation (HCC)    Congestive heart failure (CHF) (HCC)    Dysphagia    Esophageal reflux    Essential hypertension    Hiatal hernia    Pulmonary embolus ([place])    Thyroid disease     Past Surgical History:  Procedure Laterality Date   COLONOSCOPY     ELBOW SURGERY     LAPAROSCOPIC HYSTERECTOMY      Social History  reports that she quit smoking about 20 years ago. Her smoking use included cigarettes. She smoked an average of 1 pack per day. She has never used smokeless tobacco. No history on file for alcohol use and drug use.  Allergies  Allergen Reactions   Acetaminophen    Amoxicillin    Codeine    Erythromycin    Iodine    Oxycodone    Oxycodone-Acetaminophen    Penicillins    Sulfasalazine    Tramadol     Family History  Problem Relation Age of Onset   Stroke Mother    Cancer Brother   Reviewed on admission  Prior to Admission medications   Medication Sig Start Date End Date Taking? Authorizing Provider  bumetanide (BUMEX) 1 MG tablet TAKE 1 TABLET BY MOUTH  EVERY DAY 06/19/21   Buford Dresser, MD  cholecalciferol (VITAMIN D3) 25 MCG (1000 UT) tablet Take 1,000 Units by mouth 2 (two) times daily.     [provider]  gabapentin (NEURONTIN) 300 MG capsule Take 300 mg by mouth 2 (two) times daily.     [provider]  levothyroxine (SYNTHROID) 112 MCG tablet Take 112 mcg by mouth daily before breakfast.     [provider]  losartan (COZAAR) 100 MG tablet Take 100 mg by mouth daily.    [provider]  metoprolol succinate (TOPROL-XL) 50 MG 24 hr tablet TAKE 1 TABLET BY MOUTH DAILY.  TAKE WITH OR IMMEDIATELY FOLLOWING A MEAL. 05/06/21   Buford Dresser, MD  Multiple Vitamins-Minerals (PRESERVISION/LUTEIN) CAPS Take 1 capsule by mouth 2 (two) times daily.     [provider]  pantoprazole (PROTONIX) 40 MG tablet Take 40 mg by mouth daily.    [provider]  spironolactone (ALDACTONE) 25 MG tablet TAKE 1 TABLET BY MOUTH EVERY MORNING WITH BREAKFAST 11/11/21   Buford Dresser, MD  vitamin B-12 (CYANOCOBALAMIN) 500 MCG tablet Take 500 mcg by mouth daily.    [provider]  warfarin (COUMADIN) 1 MG tablet Take 1-2 mg by mouth See admin instructions. Take 1 tablet (55m) by mouth on Monday and Friday. All other days take 2 tablets (222m    [provider]    Physical Exam: Vitals:   11/15/21 1848 11/15/21 1850 11/15/21 1900 11/15/21 2000  BP: (!) 122/55  (!) 121/51 137/64  Pulse: 62  (!) 40 72  Resp: 18 19 (!) 24 19  Temp:      TempSrc:      SpO2: 92%  90% 92%  Weight:      Height:        Physical Exam Constitutional:      General: She is not in acute distress.    Appearance: Normal appearance.  HENT:     Head: Normocephalic and atraumatic.     Mouth/Throat:     Mouth: Mucous membranes are moist.     Pharynx: Oropharynx is clear.  Eyes:     Extraocular Movements: Extraocular movements intact.     Pupils: Pupils are equal, round, and reactive to light.  Cardiovascular:     Rate and Rhythm: Normal rate. Rhythm irregular.     Pulses: Normal pulses.     Heart sounds: Normal heart sounds.  Pulmonary:     Effort: Pulmonary effort is normal. No respiratory distress.     Breath sounds: Normal breath sounds.  Abdominal:     General: Bowel sounds are normal. There is no distension.     Palpations: Abdomen is soft.     Tenderness: There is no abdominal tenderness.  Musculoskeletal:     Comments: Right lower extremity erythema, warmth, edema, not as much on the foot itself but on the shin near wound and also on the right  second toe.  Skin:    General: Skin is warm and dry.  Neurological:     General: No focal deficit present.     Mental Status: Mental status is at baseline.   Labs on Admission: I have personally reviewed following labs and imaging studies  CBC: Recent Labs  Lab 11/15/21 1743  WBC 12.4*  NEUTROABS 11.3*  HGB 11.7*  HCT 35.9*  MCV 93.0  PLT 17638  Basic Metabolic Panel: Recent Labs  Lab 11/15/21 1743  NA 130*  K 4.4  CL 95*  CO2 26  GLUCOSE 130*  BUN 24*  CREATININE 1.29*  CALCIUM 9.0    GFR: Estimated Creatinine Clearance: 28.9 mL/min (A) (by C-G formula based on SCr of 1.29 mg/dL (H)).  Liver Function Tests: Recent Labs  Lab 11/15/21 1743  AST 30  ALT 20  ALKPHOS 141*  BILITOT 0.8  PROT 6.7  ALBUMIN 3.3*    Urine analysis:    Component Value Date/Time   COLORURINE YELLOW 11/15/2021 1743   APPEARANCEUR CLEAR 11/15/2021 1743   LABSPEC 1.012 11/15/2021 1743   PHURINE 5.0 11/15/2021 1743   GLUCOSEU NEGATIVE 11/15/2021 1743   HGBUR NEGATIVE 11/15/2021 1743   BILIRUBINUR NEGATIVE 11/15/2021 1743   [place] 11/15/2021 1743   PROTEINUR 30 (A) 11/15/2021 1743   NITRITE NEGATIVE 11/15/2021 1743   LEUKOCYTESUR NEGATIVE 11/15/2021 1743    Radiological Exams on Admission: DG Tibia/Fibula Right  Result Date: 11/15/2021 CLINICAL DATA:  Wound. Leg pain. Allergic reaction to the lower extremities after taking an antibiotic. EXAM: RIGHT TIBIA AND FIBULA - 2 VIEW COMPARISON:  None. FINDINGS: There is diffuse decreased bone mineralization. Severe medial compartment of the knee joint space narrowing and peripheral osteophytosis degenerative change. No acute fracture is seen within the tibia or fibula. There are dense vascular calcifications. Mild anterior tibiotalar osteoarthritis. IMPRESSION: No acute fracture is seen. Severe medial knee compartment osteoarthritis. Electronically Signed   By: [name] M.D.   On: 11/15/2021 19:25   DG Chest Port 1  View  Result Date: 11/15/2021 CLINICAL DATA:  Fever and cough.  Shortness of breath. EXAM: PORTABLE CHEST 1 VIEW COMPARISON:  08/02/2020 FINDINGS: Marked cardiac enlargement remains stable. Aortic atherosclerotic calcification noted. Both lungs are clear. IMPRESSION: Stable marked cardiac enlargement. No active lung disease. Electronically Signed   By: Marlaine Hind M.D.   On: 11/15/2021 18:02   DG Toe 2nd Right  Result Date: 11/15/2021 CLINICAL DATA:  wound EXAM: RIGHT SECOND TOE COMPARISON:  None. FINDINGS: Query vague cortical erosion along the medial base of the second digit proximal phalanx on frontal view. There is no evidence of fracture or dislocation. Degenerative changes of the first digit. Subcutaneus soft tissue edema of the second digit. Vascular calcification IMPRESSION: Query vague cortical erosion along the medial base of the second digit proximal phalanx. If high clinical suspicion for osteomyelitis, recommend MRI for further evaluation (with intravenous contrast if GFR greater than 30). Electronically Signed   By: Iven Finn M.D.   On: 11/15/2021 19:26    EKG: Independently reviewed.  Atrial fibrillation at 69 bpm.  Nonspecific T wave changes.  Assessment/Plan Principal Problem:   Osteomyelitis of second toe of right foot (HCC) Active Problems:   Chronic systolic heart failure (HCC)   Chronic thromboembolic pulmonary hypertension (HCC)   Gastroesophageal reflux disease   Essential hypertension   Hypothyroidism   Permanent atrial fibrillation (HCC)   Cellulitis  Osteomyelitis of the second toe of the right foot Cellulitis > Patient presenting with leg pain and swelling with concern for possible rash on leg. > Appears to have cellulitis and not rash secondary to medication as patient thought. > Leukocytosis of 12.4, temperature of 100.2, lactic acid initially elevated to 2.3 with repeat pending. > X-ray of the right second toe with evidence of osteomyelitis with  recommendation for MRI to confirm. > Received vancomycin, cefepime, Flagyl in the ED as well as 3 L of fluid and started on a rate of fluids. - Monitor on telemetry - Continue with vancomycin and cefepime - Trend  fever curve and white count - Check CRP  - MRI of the right foot - Trend fever curve and white count - Consider podiatry versus orthopedic consult in the morning  CHF Hypertension Chronic thromboembolic pulmonary hypertension > Last echo in 2019 and [place] with EF greater than 70%, severely dilated RV, severe pulmonary hypertension, severe tricuspid regurgitation - Hold off on any further IV fluids considering his history - Continue home Bumex, losartan, metoprolol, spironolactone  GERD - Continue PPI  Hypothyroidism - Continue home Synthroid  Atrial fibrillation - Continue home metoprolol - Warfarin per pharmacy  DVT prophylaxis: Warfarin Code Status:   DNR Family Communication:  Updated at bedside Disposition Plan:   Patient is from:  Home  Anticipated DC to:  Home  Anticipated DC date:  Pending clinical course  Anticipated DC barriers: None   Consults called:  None, may benefit from Ortho versus podiatry in the morning. Admission status:  Observation, telemetry  Severity of Illness: The appropriate patient status for this patient is OBSERVATION. Observation status is judged to be reasonable and necessary in order to provide the required intensity of service to ensure the patient's safety. The patient's presenting symptoms, physical exam findings, and initial radiographic and laboratory data in the context of their medical condition is felt to place them at decreased risk for further clinical deterioration. Furthermore, it is anticipated that the patient will be medically stable for discharge from the hospital within 2 midnights of admission.    Marcelyn Bruins MD [facility]  How to contact the [facility] Attending or Consulting provider [place] or  covering provider during after hours [place], for this patient?   Check the care team in [facility] and look for a) attending/consulting [facility] provider listed and b) the [facility] team listed Log into [email or web address] and use [place]'s universal password to access. If you do not have the password, please contact the hospital operator. Locate the [facility] provider you are looking for under [facility] and page to a number that you can be directly reached. If you still have difficulty reaching the provider, please page the [facility] (Director on Call) for the Hospitalists listed on amion for assistance.  11/15/2021, 8:50 PM

## 2021-11-15 NOTE — Progress Notes (Signed)
PHARMACY -  BRIEF ANTIBIOTIC NOTE  ? ?Pharmacy has received consult(s) for aztreonam and vancomycin from an ED provider.  The patient's profile has been reviewed for ht/wt/allergies/indication/available labs.   ? ?Pharmacy to investigate beta-lactam allergy and change aztreonam to cefepime if patient has tolerated cephalosporins in the past. Patient received prescription for Keflex in 2021. ? ?One time order(s) placed for vancomycin 2 g + cefepime 2 g ? ?Further antibiotics/pharmacy consults should be ordered by admitting physician if indicated.       ?                ?Thank you, ? ?Pricilla Riffle, PharmD, BCPS ?Clinical Pharmacist ?11/15/2021 7:52 PM ? ? ?

## 2021-11-15 NOTE — ED Notes (Signed)
Patient back from MRI.

## 2021-11-15 NOTE — Sepsis Progress Note (Signed)
Elink following Code Sepsis. 

## 2021-11-15 NOTE — Sepsis Progress Note (Signed)
Pt now admitted. Messaged new Bedside RN about need to draw repeat lactic acid  ?

## 2021-11-15 NOTE — ED Notes (Signed)
Sepsis navigator nurse advised patient needed second lactic drawn.  Will draw when boluses are completed.   ?

## 2021-11-15 NOTE — Progress Notes (Signed)
ANTICOAGULATION CONSULT NOTE - Initial Consult ? ?Pharmacy Consult for warfarin ?Indication: atrial fibrillation ? ?Allergies  ?Allergen Reactions  ? Acetaminophen   ? Amoxicillin   ? Azithromycin   ?  Other reaction(s): unsure  ? Codeine   ?  Other reaction(s): unsure  ? Erythromycin   ? Erythromycin Ethylsuccinate   ? Green Dyes Other (See Comments)  ?  Allergic to ALL dyes  ? Iodine   ? Misc. Sulfonamide Containing Compounds   ? Oxycodone   ? Oxycodone-Acetaminophen   ? Penicillins   ? Sulfasalazine   ? Tramadol Hives  ? ? ?Patient Measurements: ?Height: 5\' 2"  (157.5 cm) ?Weight: 86.2 kg (190 lb 0.6 oz) ?IBW/kg (Calculated) : 50.1 ?Heparin Dosing Weight:  ? ?Vital Signs: ?Temp: 98.4 ?F (36.9 ?C) (03/10 1705) ?Temp Source: Oral (03/10 1705) ?BP: 137/64 (03/10 2000) ?Pulse Rate: 72 (03/10 2000) ? ?Labs: ?Recent Labs  ?  11/15/21 ?1743  ?HGB 11.7*  ?HCT 35.9*  ?PLT 174  ?LABPROT 30.0*  ?INR 2.9*  ?CREATININE 1.29*  ? ? ?Estimated Creatinine Clearance: 28.9 mL/min (A) (by C-G formula based on SCr of 1.29 mg/dL (H)). ? ? ?Medical History: ?Past Medical History:  ?Diagnosis Date  ? Anticoagulant long-term use   ? Arrhythmia   ? Arthritis   ? Atrial fibrillation (HCC)   ? Congestive heart failure (CHF) (HCC)   ? Dysphagia   ? Esophageal reflux   ? Essential hypertension   ? Hiatal hernia   ? Pulmonary embolus (HCC)   ? Thyroid disease   ? ? ? ?Assessment: ?88 yo female presents with osteomyelitis of second toe of rt foot.  History of Afib on warfarin at home, pharmacy consulted to manage. ? ?Home dose warfarin:1mg  on Mon and Friday, 2mg  all other days. Last dose unknown ? ?INR 2.9, Hgb 11.7, Plts WNL ? ?INR ?Goal of Therapy:  ?INR 2-3 ? ?  ?Plan:  ?Warfarin 2mg  po x 1 starting tomorrow at 1600 ?Daily INR ? ?Friday RPh ?11/15/2021, 11:41 PM ? ? ?

## 2021-11-15 NOTE — ED Notes (Signed)
Patient assisted to [facility]SC.

## 2021-11-15 NOTE — Sepsis Progress Note (Signed)
Notified bedside nurse of need to draw repeat lactic acid. 

## 2021-11-15 NOTE — ED Triage Notes (Signed)
Pt BIB EMS from home for possible UTI, leg pain, and an allergic reaction to her lower extremities after taking an antibiotic. Pt also has leg pain and a wound on her right shin. Pt has been prescribed macrobid for UTI and silvasorb for her wound that she's been taking.  ? ?190/72 ?88-108 hr ?28 rr ?95% room air ?29-35 CO2 ?192 cbg ?99.9 F ?15 GCS ? ?18g L AC ? ? ? ? ?

## 2021-11-15 NOTE — ED Notes (Signed)
Patient to MRI.

## 2021-11-15 NOTE — ED Provider Notes (Signed)
[facility]-EMERGENCY DEPT Provider Note   CSN: [id number] Arrival date & time: 11/15/21  1647     History  Chief Complaint  Patient presents with   Possible UTI   Leg Pain    [name] is a 88 y.o. female.  HPI 88 year old female history of A-fib, on chronic anticoagulation, history of hypertension, PE, presents today complaining of leg pain and swelling, concern for reaction from antibiotic.  Patient endorses some generalized weakness.  She states she has had some cough. Daughter is in route and awaiting further history. Daughter states her mother has lived with her for 3 years and felt like she was a little different las Thursday and thought she had a uti.  She took urine to [facility] last Thursday- and did not think she had a uti alhtough traces of leukocyts and sent for cx and told she had a uti. Antibiotic started yesterday nitrofurantoin 1 loc and one this am.  She has had leg wound and toe seemed worse and noted leg with rash spreading up.  They thought the rash might be due to antibiotic. Walking with her walker.  Taking po ok.  Wound to right second toe with redness. PMD is [name], was Dr. Hyman [name] who has left.     Home Medications Prior to Admission medications   Medication Sig Start Date End Date Taking? Authorizing Provider  bumetanide (BUMEX) 1 MG tablet TAKE 1 TABLET BY MOUTH EVERY DAY 06/19/21   Jodelle Redhristopher, Bridgette, MD  cholecalciferol (VITAMIN D3) 25 MCG (1000 UT) tablet Take 1,000 Units by mouth 2 (two) times daily.     [provider]  gabapentin (NEURONTIN) 300 MG capsule Take 300 mg by mouth 2 (two) times daily.     [provider]  levothyroxine (SYNTHROID) 112 MCG tablet Take 112 mcg by mouth daily before breakfast.     [provider]  losartan (COZAAR) 100 MG tablet Take 100 mg by mouth daily.    [provider]  metoprolol succinate (TOPROL-XL) 50 MG 24 hr tablet TAKE 1 TABLET BY MOUTH DAILY. TAKE WITH  OR IMMEDIATELY FOLLOWING A MEAL. 05/06/21   Jodelle Redhristopher, Bridgette, MD  Multiple Vitamins-Minerals (PRESERVISION/LUTEIN) CAPS Take 1 capsule by mouth 2 (two) times daily.     [provider]  pantoprazole (PROTONIX) 40 MG tablet Take 40 mg by mouth daily.    [provider]  spironolactone (ALDACTONE) 25 MG tablet TAKE 1 TABLET BY MOUTH EVERY MORNING WITH BREAKFAST 11/11/21   Jodelle Redhristopher, Bridgette, MD  vitamin B-12 (CYANOCOBALAMIN) 500 MCG tablet Take 500 mcg by mouth daily.    [provider]  warfarin (COUMADIN) 1 MG tablet Take 1-2 mg by mouth See admin instructions. Take 1 tablet (1mg ) by mouth on Monday and Friday. All other days take 2 tablets (2mg )    [provider]      Allergies    Acetaminophen, Amoxicillin, Codeine, Erythromycin, Iodine, Oxycodone, Oxycodone-acetaminophen, Penicillins, Sulfasalazine, and Tramadol    Review of Systems   Review of Systems  Constitutional:  Positive for fatigue.  Respiratory:  Positive for choking.   All other systems reviewed and are negative.  Physical Exam Updated Vital Signs BP 137/64    Pulse 72    Temp 98.4 F (36.9 C) (Oral)    Resp 19    Ht 1.575 m (5\' 2" )    Wt 86.2 kg    SpO2 92%    BMI 34.76 kg/m  Physical Exam Vitals and nursing note reviewed.  Constitutional:      General: She is not in acute distress.    Appearance: Normal appearance. She is obese.  HENT:     Head: Normocephalic.     Right Ear: External ear normal.     Left Ear: External ear normal.     Nose: Nose normal.     Mouth/Throat:     Mouth: Mucous membranes are dry.     Pharynx: Oropharynx is clear.  Eyes:     Extraocular Movements: Extraocular movements intact.     Pupils: Pupils are equal, round, and reactive to light.  Cardiovascular:     Rate and Rhythm: Normal rate. Rhythm irregular.  Pulmonary:     Effort: Pulmonary effort is normal.     Breath sounds: Normal breath sounds.  Abdominal:     General: Bowel sounds are  normal.  Musculoskeletal:        General: Normal range of motion.     Cervical back: Normal range of motion.     Comments: Erythema right lower extremity from knee down Wound anterior right lower extremity with some discharge no surrounding fluctuance Right second toe wound red with dorsal ulcer  Skin:    Capillary Refill: Capillary refill takes less than 2 seconds.  Neurological:     General: No focal deficit present.     Mental Status: She is alert.  Psychiatric:        Mood and Affect: Mood normal.    ED Results / Procedures / Treatments   Labs (all labs ordered are listed, but only abnormal results are displayed) Labs Reviewed  CBC WITH DIFFERENTIAL/PLATELET - Abnormal; Notable for the following components:      Result Value   WBC 12.4 (*)    RBC 3.86 (*)    Hemoglobin 11.7 (*)    HCT 35.9 (*)    Neutro Abs 11.3 (*)    Lymphs Abs 0.3 (*)    All other components within normal limits  COMPREHENSIVE METABOLIC PANEL - Abnormal; Notable for the following components:   Sodium 130 (*)    Chloride 95 (*)    Glucose, Bld 130 (*)    BUN 24 (*)    Creatinine, Ser 1.29 (*)    Albumin 3.3 (*)    Alkaline Phosphatase 141 (*)    GFR, Estimated 39 (*)    All other components within normal limits  PROTIME-INR - Abnormal; Notable for the following components:   Prothrombin Time 30.0 (*)    INR 2.9 (*)    All other components within normal limits  URINALYSIS, ROUTINE W REFLEX MICROSCOPIC - Abnormal; Notable for the following components:   Protein, ur 30 (*)    Bacteria, UA RARE (*)    All other components within normal limits  LACTIC ACID, PLASMA - Abnormal; Notable for the following components:   Lactic Acid, Venous 2.3 (*)    All other components within normal limits  RESP PANEL BY RT-PCR (FLU A&B, COVID) ARPGX2  CULTURE, BLOOD (ROUTINE X 2)  CULTURE, BLOOD (ROUTINE X 2)  LACTIC ACID, PLASMA  CBC  COMPREHENSIVE METABOLIC PANEL  C-REACTIVE PROTEIN  C-REACTIVE PROTEIN     EKG EKG Interpretation  Date/Time:  Friday November 15 2021 19:53:38 EST Ventricular Rate:  69 PR Interval:    QRS Duration: 104 QT Interval:  415 QTC Calculation: 445 R Axis:   117 Text Interpretation: Atrial fibrillation Right axis deviation Probable anteroseptal infarct, old Confirmed by Margarita Grizzle [phone]) on 11/15/2021 7:59:02 PM  Radiology  DG Tibia/Fibula Right  Result Date: 11/15/2021 CLINICAL DATA:  Wound. Leg pain. Allergic reaction to the lower extremities after taking an antibiotic. EXAM: RIGHT TIBIA AND FIBULA - 2 VIEW COMPARISON:  None. FINDINGS: There is diffuse decreased bone mineralization. Severe medial compartment of the knee joint space narrowing and peripheral osteophytosis degenerative change. No acute fracture is seen within the tibia or fibula. There are dense vascular calcifications. Mild anterior tibiotalar osteoarthritis. IMPRESSION: No acute fracture is seen. Severe medial knee compartment osteoarthritis. Electronically Signed   By: Neita Garnet M.D.   On: 11/15/2021 19:25   DG Chest Port 1 View  Result Date: 11/15/2021 CLINICAL DATA:  Fever and cough.  Shortness of breath. EXAM: PORTABLE CHEST 1 VIEW COMPARISON:  08/02/2020 FINDINGS: Marked cardiac enlargement remains stable. Aortic atherosclerotic calcification noted. Both lungs are clear. IMPRESSION: Stable marked cardiac enlargement. No active lung disease. Electronically Signed   By: Danae Orleans M.D.   On: 11/15/2021 18:02   DG Toe 2nd Right  Result Date: 11/15/2021 CLINICAL DATA:  wound EXAM: RIGHT SECOND TOE COMPARISON:  None. FINDINGS: Query vague cortical erosion along the medial base of the second digit proximal phalanx on frontal view. There is no evidence of fracture or dislocation. Degenerative changes of the first digit. Subcutaneus soft tissue edema of the second digit. Vascular calcification IMPRESSION: Query vague cortical erosion along the medial base of the second digit proximal phalanx. If  high clinical suspicion for osteomyelitis, recommend MRI for further evaluation (with intravenous contrast if GFR greater than 30). Electronically Signed   By: Tish Frederickson M.D.   On: 11/15/2021 19:26    Procedures Procedures    Medications Ordered in ED Medications  metroNIDAZOLE (FLAGYL) IVPB 500 mg (has no administration in time range)  lactated ringers bolus 1,000 mL (1,000 mLs Intravenous New Bag/Given 11/15/21 1951)    And  lactated ringers bolus 1,000 mL (has no administration in time range)    And  lactated ringers bolus 1,000 mL (1,000 mLs Intravenous New Bag/Given 11/15/21 1951)  vancomycin (VANCOREADY) IVPB 2000 mg/400 mL (has no administration in time range)  losartan (COZAAR) tablet 100 mg (has no administration in time range)  metoprolol tartrate (LOPRESSOR) tablet 50 mg (has no administration in time range)  spironolactone (ALDACTONE) tablet 50 mg (has no administration in time range)  levothyroxine (SYNTHROID) tablet 112 mcg (has no administration in time range)  pantoprazole (PROTONIX) EC tablet 40 mg (has no administration in time range)  sodium chloride flush (NS) 0.9 % injection 3 mL (has no administration in time range)  acetaminophen (TYLENOL) tablet 650 mg (has no administration in time range)    Or  acetaminophen (TYLENOL) suppository 650 mg (has no administration in time range)  polyethylene glycol (MIRALAX / GLYCOLAX) packet 17 g (has no administration in time range)  ceFEPIme (MAXIPIME) 2 g in sodium chloride 0.9 % 100 mL IVPB (2 g Intravenous New Bag/Given 11/15/21 2020)    ED Course/ Medical Decision Making/ A&P Clinical Course as of 11/15/21 2058  Fri Nov 15, 2021  1850 WBC(!): 12.4 Cbc reviewed and interpreted. Leukocytosis noted [DR]  1851 Protime-INR(!) INR reviewed interpreted and therapeutic  [DR]  1851 CBC with Differential(!) [DR]  1852 Hgb slightly down from first prior [DR]  1930 Initial lactic acid elevated at 2.3 Patient with blood  pressure 121/50  [DR]  1930 Urinalysis, Routine w reflex microscopic Urine, Catheterized(!) Urinalysis significant for 0-5 white blood cells, 0-5 red blood cells, rare bacteria, this is catheterized specimen [  DR]  1950 Chest x-Armani Gawlik reviewed and interpreted with cardiomegaly without evidence of acute abnormality noted Radiologist interpretation reviewed and notes stable marked cardiac enlargement no active lung disease [DR]  1950 Toe x-Halleigh Comes reviewed and vague cortical erosion noted by radiologist which could represent osteomyelitis and may require MRI for further evaluation [DR]    Clinical Course User Index [DR] Margarita Grizzle, MD                           Medical Decision Making Fever, weakness, uti, rle ? UTI Rle cellulitis, vs osteo vs abscess Patient will be given broad-spectrum antibiotics and IV fluids given initial elevated lactic acid.  88 year old female presents today with weakness and concern for sepsis.  Here in the ED she has a leukocytosis and elevated lactic acid.  Heart rate has been normal in the 60s to 70s although there was one recorded as 40, heart rate has not been low in any of my evaluations.  Patient has multiple positive blood sources with reports of recent urinary tract infection on nitrofurantoin, wound to right lower leg with probable cellulitis spreading up to the knee, erythema and infection of right second toe with possible osteomyelitis underlying it. Patient is receiving broad-spectrum antibiotics, IV fluids, and appears hemodynamically stable at this time. She has some mild coughing and sats have been 92 to 93%.  Chest x-Virgilio Broadhead does not show any evidence of acute infiltrate.  COVID, flu swab pending.  Amount and/or Complexity of Data Reviewed Independent Historian:     Details: Daughter at bedside gives additional history as per HPI Labs: ordered. Decision-making details documented in ED Course. Radiology: ordered and independent interpretation performed.  Decision-making details documented in ED Course. ECG/medicine tests: ordered. Decision-making details documented in ED Course.    Details: Patient remains on monitor with heart rate in 60s to 70s and in A-fib rhythm Oxygen saturations 93% on room air Discussion of management or test interpretation with external provider(s): Care discussed with Dr. Alinda Money who will see for admission  Risk Prescription drug management. Decision regarding hospitalization.           Final Clinical Impression(s) / ED Diagnoses Final diagnoses:  Sepsis, due to unspecified organism, unspecified whether acute organ dysfunction present [facility])  Osteomyelitis of other site, unspecified type [facility])    Rx / DC Orders ED Discharge Orders     None         Margarita Grizzle, MD 11/15/21 [phone]

## 2021-11-15 NOTE — Progress Notes (Signed)
Pharmacy Antibiotic Note ? ?[name] is a 88 y.o. female admitted on 11/15/2021 with leg pain and a possible allergic reaction to lower extremities after taking an antibiotic. She was prescribed nitrofurantoin for UTI, was also using topical cream/ointment for wound. Pharmacy has been consulted for vancomycin and cefepime dosing for osteomyelitis. ? ?Today, 11/15/21 ?- MRI pending ?- SCr 1.29 on admission, appears consistent (slightly lower) with previous values in Epic - no labs in Care Everywhere ? ?Plan: ?Vancomycin 2 g IV followed by 1000 mg IV q48h  ?Goal AUC 400-550 ?Expected AUC: 503 ?SCr used: 1.29 ? ?Cefepime 2 g IV q24h ? ?Continue to follow cultures, labs and clinical progress for necessary antibiotic dose adjustments and/or appropriate de-escalation ? ? ?Height: 5\' 2"  (157.5 cm) ?Weight: 86.2 kg (190 lb 0.6 oz) ?IBW/kg (Calculated) : 50.1 ? ?Temp (24hrs), Avg:98.4 ?F (36.9 ?C), Min:98.4 ?F (36.9 ?C), Max:98.4 ?F (36.9 ?C) ? ?Recent Labs  ?Lab 11/15/21 ?1743 11/15/21 ?[id number]  ?WBC 12.4*  --   ?CREATININE 1.29*  --   ?LATICACIDVEN  --  2.3*  ?  ?Estimated Creatinine Clearance: 28.9 mL/min (A) (by C-G formula based on SCr of 1.29 mg/dL (H)).   ? ?Allergies  ?Allergen Reactions  ? Acetaminophen   ? Amoxicillin   ? Codeine   ? Erythromycin   ? Iodine   ? Oxycodone   ? Oxycodone-Acetaminophen   ? Penicillins   ? Sulfasalazine   ? Tramadol   ? ? ?Antimicrobials this admission: ?Vancomcyin 3/10 >> ?Cefepime 3/10 >> ? ?Dose adjustments this admission: NA ? ?Microbiology results: ?3/10 BCx: pending ? ? ?Thank you for allowing pharmacy to be a part of this patient?s care. ? ?Tawnya Crook, PharmD, BCPS ?Clinical Pharmacist ?11/15/2021 9:07 PM ? ? ?

## 2021-11-16 DIAGNOSIS — I071 Rheumatic tricuspid insufficiency: Secondary | ICD-10-CM | POA: Diagnosis present

## 2021-11-16 DIAGNOSIS — J449 Chronic obstructive pulmonary disease, unspecified: Secondary | ICD-10-CM | POA: Diagnosis present

## 2021-11-16 DIAGNOSIS — Z90710 Acquired absence of both cervix and uterus: Secondary | ICD-10-CM | POA: Diagnosis not present

## 2021-11-16 DIAGNOSIS — N183 Chronic kidney disease, stage 3 unspecified: Secondary | ICD-10-CM

## 2021-11-16 DIAGNOSIS — N1832 Chronic kidney disease, stage 3b: Secondary | ICD-10-CM | POA: Diagnosis present

## 2021-11-16 DIAGNOSIS — I5032 Chronic diastolic (congestive) heart failure: Secondary | ICD-10-CM

## 2021-11-16 DIAGNOSIS — N179 Acute kidney failure, unspecified: Secondary | ICD-10-CM | POA: Diagnosis present

## 2021-11-16 DIAGNOSIS — D696 Thrombocytopenia, unspecified: Secondary | ICD-10-CM | POA: Diagnosis present

## 2021-11-16 DIAGNOSIS — M869 Osteomyelitis, unspecified: Secondary | ICD-10-CM | POA: Diagnosis present

## 2021-11-16 DIAGNOSIS — I2724 Chronic thromboembolic pulmonary hypertension: Secondary | ICD-10-CM | POA: Diagnosis present

## 2021-11-16 DIAGNOSIS — L97819 Non-pressure chronic ulcer of other part of right lower leg with unspecified severity: Secondary | ICD-10-CM | POA: Diagnosis present

## 2021-11-16 DIAGNOSIS — E871 Hypo-osmolality and hyponatremia: Secondary | ICD-10-CM | POA: Diagnosis present

## 2021-11-16 DIAGNOSIS — E785 Hyperlipidemia, unspecified: Secondary | ICD-10-CM | POA: Diagnosis present

## 2021-11-16 DIAGNOSIS — Z66 Do not resuscitate: Secondary | ICD-10-CM | POA: Diagnosis present

## 2021-11-16 DIAGNOSIS — L97519 Non-pressure chronic ulcer of other part of right foot with unspecified severity: Secondary | ICD-10-CM | POA: Diagnosis present

## 2021-11-16 DIAGNOSIS — I110 Hypertensive heart disease with heart failure: Secondary | ICD-10-CM | POA: Diagnosis not present

## 2021-11-16 DIAGNOSIS — D631 Anemia in chronic kidney disease: Secondary | ICD-10-CM | POA: Diagnosis present

## 2021-11-16 DIAGNOSIS — Z20822 Contact with and (suspected) exposure to covid-19: Secondary | ICD-10-CM | POA: Diagnosis present

## 2021-11-16 DIAGNOSIS — I130 Hypertensive heart and chronic kidney disease with heart failure and stage 1 through stage 4 chronic kidney disease, or unspecified chronic kidney disease: Secondary | ICD-10-CM | POA: Diagnosis present

## 2021-11-16 DIAGNOSIS — I4891 Unspecified atrial fibrillation: Secondary | ICD-10-CM | POA: Diagnosis not present

## 2021-11-16 DIAGNOSIS — I5033 Acute on chronic diastolic (congestive) heart failure: Secondary | ICD-10-CM

## 2021-11-16 DIAGNOSIS — M79604 Pain in right leg: Secondary | ICD-10-CM | POA: Diagnosis present

## 2021-11-16 DIAGNOSIS — L03115 Cellulitis of right lower limb: Secondary | ICD-10-CM | POA: Diagnosis present

## 2021-11-16 DIAGNOSIS — Z87891 Personal history of nicotine dependence: Secondary | ICD-10-CM | POA: Diagnosis not present

## 2021-11-16 DIAGNOSIS — I4821 Permanent atrial fibrillation: Secondary | ICD-10-CM | POA: Diagnosis present

## 2021-11-16 DIAGNOSIS — E039 Hypothyroidism, unspecified: Secondary | ICD-10-CM | POA: Diagnosis present

## 2021-11-16 DIAGNOSIS — Z86711 Personal history of pulmonary embolism: Secondary | ICD-10-CM | POA: Diagnosis not present

## 2021-11-16 DIAGNOSIS — I509 Heart failure, unspecified: Secondary | ICD-10-CM | POA: Diagnosis not present

## 2021-11-16 DIAGNOSIS — D72829 Elevated white blood cell count, unspecified: Secondary | ICD-10-CM

## 2021-11-16 DIAGNOSIS — D638 Anemia in other chronic diseases classified elsewhere: Secondary | ICD-10-CM | POA: Diagnosis not present

## 2021-11-16 DIAGNOSIS — K219 Gastro-esophageal reflux disease without esophagitis: Secondary | ICD-10-CM | POA: Diagnosis present

## 2021-11-16 DIAGNOSIS — M2041 Other hammer toe(s) (acquired), right foot: Secondary | ICD-10-CM | POA: Diagnosis present

## 2021-11-16 LAB — COMPREHENSIVE METABOLIC PANEL
ALT: 18 U/L (ref 0–44)
AST: 26 U/L (ref 15–41)
Albumin: 2.9 g/dL — ABNORMAL LOW (ref 3.5–5.0)
Alkaline Phosphatase: 112 U/L (ref 38–126)
Anion gap: 9 (ref 5–15)
BUN: 24 mg/dL — ABNORMAL HIGH (ref 8–23)
CO2: 24 mmol/L (ref 22–32)
Calcium: 8.8 mg/dL — ABNORMAL LOW (ref 8.9–10.3)
Chloride: 97 mmol/L — ABNORMAL LOW (ref 98–111)
Creatinine, Ser: 1.3 mg/dL — ABNORMAL HIGH (ref 0.44–1.00)
GFR, Estimated: 39 mL/min — ABNORMAL LOW (ref >60)
Glucose, Bld: 100 mg/dL — ABNORMAL HIGH (ref 70–99)
Potassium: 4.6 mmol/L (ref 3.5–5.1)
Sodium: 130 mmol/L — ABNORMAL LOW (ref 135–145)
Total Bilirubin: 1.2 mg/dL (ref 0.3–1.2)
Total Protein: 5.9 g/dL — ABNORMAL LOW (ref 6.5–8.1)

## 2021-11-16 LAB — SURGICAL PCR SCREEN
MRSA, PCR: NEGATIVE
Staphylococcus aureus: POSITIVE — AB

## 2021-11-16 LAB — BLOOD CULTURE ID PANEL (REFLEXED) - BCID2

## 2021-11-16 LAB — CBC
HCT: 33.1 % — ABNORMAL LOW (ref 36.0–46.0)
Hemoglobin: 10.5 g/dL — ABNORMAL LOW (ref 12.0–15.0)
MCH: 30.3 pg (ref 26.0–34.0)
MCHC: 31.7 g/dL (ref 30.0–36.0)
MCV: 95.4 fL (ref 80.0–100.0)
Platelets: 144 10*3/uL — ABNORMAL LOW (ref 150–400)
RBC: 3.47 MIL/uL — ABNORMAL LOW (ref 3.87–5.11)
RDW: 14.5 % (ref 11.5–15.5)
WBC: 10.4 10*3/uL (ref 4.0–10.5)
nRBC: 0 % (ref 0.0–0.2)

## 2021-11-16 LAB — LACTIC ACID, PLASMA: Lactic Acid, Venous: 1.7 mmol/L (ref 0.5–1.9)

## 2021-11-16 LAB — C-REACTIVE PROTEIN: CRP: 4.7 mg/dL — ABNORMAL HIGH (ref <1.0)

## 2021-11-16 LAB — PROTIME-INR
INR: 2.8 — ABNORMAL HIGH (ref 0.8–1.2)
Prothrombin Time: 29.6 seconds — ABNORMAL HIGH (ref 11.4–15.2)

## 2021-11-16 MED ORDER — CEFAZOLIN SODIUM-DEXTROSE 2-4 GM/100ML-% IV SOLN
2.0000 g | INTRAVENOUS | Status: AC
  Administered 2021-11-17: 2 g via INTRAVENOUS

## 2021-11-16 MED ORDER — CHLORHEXIDINE GLUCONATE 4 % EX LIQD
60.0000 mL | Freq: Once | CUTANEOUS | Status: AC
  Administered 2021-11-17: 4 via TOPICAL
  Filled 2021-11-16 (×2): qty 60

## 2021-11-16 MED ORDER — METRONIDAZOLE 500 MG/100ML IV SOLN
500.0000 mg | Freq: Two times a day (BID) | INTRAVENOUS | Status: DC
  Administered 2021-11-16 – 2021-11-18 (×5): 500 mg via INTRAVENOUS
  Filled 2021-11-16 (×5): qty 100

## 2021-11-16 MED ORDER — CHLORHEXIDINE GLUCONATE CLOTH 2 % EX PADS
6.0000 | MEDICATED_PAD | Freq: Every day | CUTANEOUS | Status: AC
  Administered 2021-11-16 – 2021-11-20 (×5): 6 via TOPICAL

## 2021-11-16 MED ORDER — MUPIROCIN 2 % EX OINT
1.0000 "application " | TOPICAL_OINTMENT | Freq: Two times a day (BID) | CUTANEOUS | Status: AC
  Administered 2021-11-16 – 2021-11-20 (×10): 1 via NASAL
  Filled 2021-11-16 (×2): qty 22

## 2021-11-16 MED ORDER — GABAPENTIN 100 MG PO CAPS
100.0000 mg | ORAL_CAPSULE | Freq: Two times a day (BID) | ORAL | Status: DC
  Administered 2021-11-16 – 2021-11-21 (×11): 100 mg via ORAL
  Filled 2021-11-16 (×11): qty 1

## 2021-11-16 NOTE — Assessment & Plan Note (Addendum)
Continue Toprol-XL and home warfarin.  INR therapeutic.

## 2021-11-16 NOTE — Assessment & Plan Note (Deleted)
CKD IIIb ?-creatinine at baseline. Monitor ?

## 2021-11-16 NOTE — Assessment & Plan Note (Signed)
-  mild. monitor

## 2021-11-16 NOTE — Progress Notes (Signed)
Received secure chat from bedside RN with concerns about pt code status. Upon speaking with pt and asking if she would prefer no extreme or invasive measure be taken if she was to stop breathing or her heart was to stop she states "I don't want to just die." When asked if she is ok with chest compressions and possible medications to sustain/save her life she states "Yes, of course." Pt appears to be alert and oriented x4.  Will change pt to full code for now.  ? ?Audrea Muscat, NP ?[facility] 7p-7a  ?[phone] ?

## 2021-11-16 NOTE — Assessment & Plan Note (Signed)
Hypertension Chronic pulmonary hypertension - Currently compensated.  Strict input and output.  Daily weights.  Last echo in 2019 in [place] showed EF of greater than 70% with severe pulmonary hypertension and severe tricuspid regurgitation -Strict input and output.  Daily weights.  Fluid restriction.  Continue Bumex, losartan, metoprolol, spironolactone, outpatient follow-up with PCP/cardiology

## 2021-11-16 NOTE — Consult Note (Signed)
Reason for Consult: Right second toe ulcer Referring Physician: Dr. Lajoyce Corners [name] is an 88 y.o. female.  HPI: 88 year old female with past medical history significant for recurrent pulmonary embolism requiring chronic anticoagulation was admitted to the hospital yesterday for right foot swelling and erythema.  She has had an ulcer on the dorsum of the second toe for many weeks.  She has a wound on the pretibial area of the right leg that happened after a bruise at the end of December.  This area has been slowly healing with wound care provided by a home health RN.  She has not been able to wear her compression stockings due to the wound on the leg.  As a result the dorsum of her second toe where she has a chronic hammertoe deformity has been rubbing the top of her shoe.  The ulcer has had a scab until just recently.  The scab sloughed off.  She began feeling poorly earlier this week.  There was suspicion for a urinary tract infection, and she was started on nitrofurantoin.  She began having increased swelling and redness of the right foot over the last couple of days.  She was found to be hypertensive yesterday and was brought to the emergency room.  Her white blood cell count was elevated along with her C-reactive protein.  She was started on antibiotics.  She is not diabetic.  She is not a smoker.  Her daughter is at bedside and provides much of the history.  The patient denies any pain at the toe.  She has been off of her Coumadin for a couple of days now.  Past Medical History:  Diagnosis Date   Anticoagulant long-term use    Arrhythmia    Arthritis    Atrial fibrillation (HCC)    Congestive heart failure (CHF) (HCC)    Dysphagia    Esophageal reflux    Essential hypertension    Hiatal hernia    Pulmonary embolus (HCC)    Thyroid disease     Past Surgical History:  Procedure Laterality Date   COLONOSCOPY     ELBOW SURGERY     LAPAROSCOPIC HYSTERECTOMY      Family History   Problem Relation Age of Onset   Stroke Mother    Cancer Brother     Social History:  reports that she quit smoking about 20 years ago. Her smoking use included cigarettes. She smoked an average of 1 pack per day. She has never used smokeless tobacco. No history on file for alcohol use and drug use.  Allergies:  Allergies  Allergen Reactions   Acetaminophen    Amoxicillin    Azithromycin     Other reaction(s): unsure   Codeine     Other reaction(s): unsure   Erythromycin    Erythromycin Ethylsuccinate    Green Dyes Other (See Comments)    Allergic to ALL dyes   Iodine    Misc. Sulfonamide Containing Compounds    Oxycodone    Oxycodone-Acetaminophen    Penicillins    Sulfasalazine    Tramadol Hives    Medications: I have reviewed the patient's current medications.  Results for orders placed or performed during the hospital encounter of 11/15/21 (from the past 48 hour(s))  CBC with Differential     Status: Abnormal   Collection Time: 11/15/21  5:43 PM  Result Value Ref Range   WBC 12.4 (H) 4.0 - 10.5 K/uL   RBC 3.86 (L) 3.87 - 5.11 MIL/uL  Hemoglobin 11.7 (L) 12.0 - 15.0 g/dL   HCT 35.9 (L) 36.0 - 46.0 %   MCV 93.0 80.0 - 100.0 fL   MCH 30.3 26.0 - 34.0 pg   MCHC 32.6 30.0 - 36.0 g/dL   RDW 14.3 11.5 - 15.5 %   Platelets 174 150 - 400 K/uL   nRBC 0.0 0.0 - 0.2 %   Neutrophils Relative % 92 %   Neutro Abs 11.3 (H) 1.7 - 7.7 K/uL   Lymphocytes Relative 2 %   Lymphs Abs 0.3 (L) 0.7 - 4.0 K/uL   Monocytes Relative 4 %   Monocytes Absolute 0.5 0.1 - 1.0 K/uL   Eosinophils Relative 2 %   Eosinophils Absolute 0.2 0.0 - 0.5 K/uL   Basophils Relative 0 %   Basophils Absolute 0.0 0.0 - 0.1 K/uL   Immature Granulocytes 0 %   Abs Immature Granulocytes 0.05 0.00 - 0.07 K/uL    Comment: Performed at [facility], [place] [address] [place], [place] [address]  Comprehensive metabolic panel     Status: Abnormal   Collection Time: 11/15/21  5:43 PM  Result  Value Ref Range   Sodium 130 (L) 135 - 145 mmol/L   Potassium 4.4 3.5 - 5.1 mmol/L   Chloride 95 (L) 98 - 111 mmol/L   CO2 26 22 - 32 mmol/L   Glucose, Bld 130 (H) 70 - 99 mg/dL    Comment: Glucose reference range applies only to samples taken after fasting for at least 8 hours.   BUN 24 (H) 8 - 23 mg/dL   Creatinine, Ser 1.29 (H) 0.44 - 1.00 mg/dL   Calcium 9.0 8.9 - 10.3 mg/dL   Total Protein 6.7 6.5 - 8.1 g/dL   Albumin 3.3 (L) 3.5 - 5.0 g/dL   AST 30 15 - 41 U/L   ALT 20 0 - 44 U/L   Alkaline Phosphatase 141 (H) 38 - 126 U/L   Total Bilirubin 0.8 0.3 - 1.2 mg/dL   GFR, Estimated 39 (L) >60 mL/min    Comment: (NOTE) Calculated using the CKD-EPI Creatinine Equation (2021)    Anion gap 9 5 - 15    Comment: Performed at [facility], [place] [address], [place], [place] [address]  Protime-INR     Status: Abnormal   Collection Time: 11/15/21  5:43 PM  Result Value Ref Range   Prothrombin Time 30.0 (H) 11.4 - 15.2 seconds   INR 2.9 (H) 0.8 - 1.2    Comment: (NOTE) INR goal varies based on device and disease states. Performed at [facility], [place] [address], [place], [place] [address]   Urinalysis, Routine w reflex microscopic Urine, Catheterized     Status: Abnormal   Collection Time: 11/15/21  5:43 PM  Result Value Ref Range   Color, Urine YELLOW YELLOW   APPearance CLEAR CLEAR   Specific Gravity, Urine 1.012 1.005 - 1.030   pH 5.0 5.0 - 8.0   Glucose, UA NEGATIVE NEGATIVE mg/dL   Hgb urine dipstick NEGATIVE NEGATIVE   Bilirubin Urine NEGATIVE NEGATIVE   Ketones, ur NEGATIVE NEGATIVE mg/dL   Protein, ur 30 (A) NEGATIVE mg/dL   Nitrite NEGATIVE NEGATIVE   Leukocytes,Ua NEGATIVE NEGATIVE   RBC / HPF 0-5 0 - 5 RBC/hpf   WBC, UA 0-5 0 - 5 WBC/hpf   Bacteria, UA RARE (A) NONE SEEN   Squamous Epithelial / LPF 0-5 0 - 5   Mucus PRESENT     Comment:  Performed at [facility], [place] [address] [place], [place] [address]   Lactic acid, plasma     Status: Abnormal   Collection Time: 11/15/21  5:44 PM  Result Value Ref Range   Lactic Acid, Venous 2.3 (HH) 0.5 - 1.9 mmol/L    Comment: CRITICAL RESULT CALLED TO, READ BACK BY AND VERIFIED WITH: TIFFANY, RN @ 1916 ON 11/15/2021 BY Ruffin Frederick, MLT Performed at [facility], [place] [address], [place], [place] [address]   Resp Panel by RT-PCR (Flu A&B, Covid) Nasopharyngeal Swab     Status: None   Collection Time: 11/15/21  5:44 PM   Specimen: Nasopharyngeal Swab; Nasopharyngeal(NP) swabs in vial transport medium  Result Value Ref Range   SARS Coronavirus 2 by RT PCR NEGATIVE NEGATIVE    Comment: (NOTE) SARS-CoV-2 target nucleic acids are NOT DETECTED.  The SARS-CoV-2 RNA is generally detectable in upper respiratory specimens during the acute phase of infection. The lowest concentration of SARS-CoV-2 viral copies this assay can detect is 138 copies/mL. A negative result does not preclude SARS-Cov-2 infection and should not be used as the sole basis for treatment or other patient management decisions. A negative result may occur with  improper specimen collection/handling, submission of specimen other than nasopharyngeal swab, presence of viral mutation(s) within the areas targeted by this assay, and inadequate number of viral copies(<138 copies/mL). A negative result must be combined with clinical observations, patient history, and epidemiological information. The expected result is Negative.  Fact Sheet for Patients:  EntrepreneurPulse.com.au  Fact Sheet for Healthcare Providers:  IncredibleEmployment.be  This test is no t yet approved or cleared by the [place] FDA and  has been authorized for detection and/or diagnosis of SARS-CoV-2 by FDA under an Emergency Use Authorization (EUA). This EUA will remain  in effect (meaning this test can be used) for the duration of the COVID-19 declaration under  Section 564(b)(1) of the Act, 21 U.S.C.section 360bbb-3(b)(1), unless the authorization is terminated  or revoked sooner.       Influenza A by PCR NEGATIVE NEGATIVE   Influenza B by PCR NEGATIVE NEGATIVE    Comment: (NOTE) The Xpert Xpress SARS-CoV-2/FLU/RSV plus assay is intended as an aid in the diagnosis of influenza from Nasopharyngeal swab specimens and should not be used as a sole basis for treatment. Nasal washings and aspirates are unacceptable for Xpert Xpress SARS-CoV-2/FLU/RSV testing.  Fact Sheet for Patients: EntrepreneurPulse.com.au  Fact Sheet for Healthcare Providers: IncredibleEmployment.be  This test is not yet approved or cleared by the [place] FDA and has been authorized for detection and/or diagnosis of SARS-CoV-2 by FDA under an Emergency Use Authorization (EUA). This EUA will remain in effect (meaning this test can be used) for the duration of the COVID-19 declaration under Section 564(b)(1) of the Act, 21 U.S.C. section 360bbb-3(b)(1), unless the authorization is terminated or revoked.  Performed at [facility], [place] [address] [place], [place] [address]   Blood culture (routine x 2)     Status: None (Preliminary result)   Collection Time: 11/15/21  6:00 PM   Specimen: BLOOD  Result Value Ref Range   Specimen Description      BLOOD RIGHT ANTECUBITAL Performed at [place] [address] [place], [place] [address]    Special Requests      BOTTLES DRAWN AEROBIC AND ANAEROBIC Blood Culture results may not be optimal due to an inadequate volume of blood received in culture bottles Performed at [facility], [place] [address] [place], [place] [address]    Culture      NO GROWTH < 12 HOURS Performed at [place] [address] [place], [place] [address]    Report Status PENDING   Blood culture (routine x 2)     Status: None (Preliminary  result)   Collection Time: 11/15/21  6:10 PM   Specimen: BLOOD  Result Value Ref Range   Specimen Description      BLOOD LEFT ANTECUBITAL Performed at [place] [address], [place], [place] [address]    Special Requests      BOTTLES DRAWN AEROBIC AND ANAEROBIC Blood Culture adequate volume Performed at [place] [address], [place], [place] [address]    Culture      NO GROWTH < 12 HOURS Performed at [place] [address], [place], [place] [address]    Report Status PENDING   Lactic acid, plasma     Status: None   Collection Time: 11/15/21 11:37 PM  Result Value Ref Range   Lactic Acid, Venous 1.7 0.5 - 1.9 mmol/L    Comment: Performed at [facility], [place] [address], [place], [place] [address]  C-reactive protein     Status: Abnormal   Collection Time: 11/15/21 11:37 PM  Result Value Ref Range   CRP 4.7 (H) <1.0 mg/dL    Comment: Performed at [place] [address] [place], [place] [address]  CBC     Status: Abnormal   Collection Time: 11/16/21  7:09 AM  Result Value Ref Range   WBC 10.4 4.0 - 10.5 K/uL   RBC 3.47 (L) 3.87 - 5.11 MIL/uL   Hemoglobin 10.5 (L) 12.0 - 15.0 g/dL   HCT 33.1 (L) 36.0 - 46.0 %   MCV 95.4 80.0 - 100.0 fL   MCH 30.3 26.0 - 34.0 pg   MCHC 31.7 30.0 - 36.0 g/dL   RDW 14.5 11.5 - 15.5 %   Platelets 144 (L) 150 - 400 K/uL    Comment: SPECIMEN CHECKED FOR CLOTS REPEATED TO VERIFY    nRBC 0.0 0.0 - 0.2 %    Comment: Performed at [facility], [place] [address], [place], [place] [address]  Comprehensive metabolic panel     Status: Abnormal   Collection Time: 11/16/21  7:09 AM  Result Value Ref Range   Sodium 130 (L) 135 - 145 mmol/L   Potassium 4.6 3.5 - 5.1 mmol/L   Chloride 97 (L) 98 - 111 mmol/L   CO2 24 22 - 32 mmol/L   Glucose, Bld 100 (H) 70 - 99 mg/dL    Comment: Glucose reference range applies only to samples taken after  fasting for at least 8 hours.   BUN 24 (H) 8 - 23 mg/dL   Creatinine, Ser 1.30 (H) 0.44 - 1.00 mg/dL   Calcium 8.8 (L) 8.9 - 10.3 mg/dL   Total Protein 5.9 (L) 6.5 - 8.1 g/dL   Albumin 2.9 (L) 3.5 - 5.0 g/dL   AST 26 15 - 41 U/L   ALT 18 0 - 44 U/L   Alkaline Phosphatase 112 38 - 126 U/L   Total Bilirubin 1.2 0.3 - 1.2 mg/dL   GFR, Estimated 39 (L) >60 mL/min    Comment: (NOTE) Calculated using the CKD-EPI Creatinine Equation (2021)    Anion gap 9 5 - 15    Comment: Performed at [facility], [place] [address], [place], [place] [address]  Protime-INR     Status: Abnormal   Collection Time: 11/16/21  7:09 AM  Result Value Ref Range   Prothrombin Time 29.6 (H) 11.4 - 15.2 seconds   INR 2.8 (H) 0.8 - 1.2    Comment: (NOTE) INR goal varies based on device and disease states. Performed at [facility], [place] [address] [place],  [address]     DG Tibia/Fibula Right  Result Date: 11/15/2021 CLINICAL DATA:  Wound. Leg pain. Allergic reaction to the lower extremities after taking an antibiotic. EXAM: RIGHT TIBIA AND FIBULA - 2 VIEW COMPARISON:  None. FINDINGS: There is diffuse decreased bone mineralization. Severe medial compartment of the knee joint space narrowing and peripheral osteophytosis degenerative change. No acute fracture is seen within the tibia or fibula. There are dense vascular calcifications. Mild anterior tibiotalar osteoarthritis. IMPRESSION: No acute fracture is seen. Severe medial knee compartment osteoarthritis. Electronically Signed   By: [name] M.D.   On: 11/15/2021 19:25   MR FOOT RIGHT WO CONTRAST  Result Date: 11/16/2021 CLINICAL DATA:  Foot swelling EXAM: MRI OF THE RIGHT FOREFOOT WITHOUT CONTRAST TECHNIQUE: Multiplanar, multisequence MR imaging of the right forefoot was performed. No intravenous contrast was administered. COMPARISON:  Radiographs 11/15/2021 FINDINGS: Despite efforts by the technologist and patient,  motion artifact is present on today's exam and could not be eliminated. This reduces exam sensitivity and specificity. Bones/Joint/Cartilage On STIR images there is abnormal diffuse marrow edema in the middle and distal phalanges of the second toe as on image 17 series 6, suspicious for osteomyelitis. No substantial degree of proximal phalangeal edema is observed. There is much more ambiguous accentuated STIR signal in the middle and distal phalanges of the fourth and fifth toes as well as in the distal phalanx of the third toe. These findings in the third through fifth toes may well be due to field heterogeneity. Spurring along the head of the first metatarsal and along the adjacent sesamoids. Small erosion or degenerative subcortical cyst medially along the first metatarsal head. Ligaments Lisfranc ligament appears intact. Muscles and Tendons Regional muscular atrophy. Soft tissues Dorsal subcutaneous edema in the forefoot. This tracks into the toes. IMPRESSION: 1. Diffuse marrow edema in the middle and distal phalanges of the second toe suspicious for osteomyelitis. No other definite suspicious regions identified. 2. Degenerative findings along the first metatarsal head. Small erosion or degenerative subcortical cyst medially along the first metatarsal head. 3. Dorsal subcutaneous edema in the forefoot tracking into the toes. 4. Regional muscular atrophy. Electronically Signed   By: Van Clines M.D.   On: 11/16/2021 08:18   DG Chest Port 1 View  Result Date: 11/15/2021 CLINICAL DATA:  Fever and cough.  Shortness of breath. EXAM: PORTABLE CHEST 1 VIEW COMPARISON:  08/02/2020 FINDINGS: Marked cardiac enlargement remains stable. Aortic atherosclerotic calcification noted. Both lungs are clear. IMPRESSION: Stable marked cardiac enlargement. No active lung disease. Electronically Signed   By: Marlaine Hind M.D.   On: 11/15/2021 18:02   DG Toe 2nd Right  Result Date: 11/15/2021 CLINICAL DATA:  wound EXAM:  RIGHT SECOND TOE COMPARISON:  None. FINDINGS: Query vague cortical erosion along the medial base of the second digit proximal phalanx on frontal view. There is no evidence of fracture or dislocation. Degenerative changes of the first digit. Subcutaneus soft tissue edema of the second digit. Vascular calcification IMPRESSION: Query vague cortical erosion along the medial base of the second digit proximal phalanx. If  high clinical suspicion for osteomyelitis, recommend MRI for further evaluation (with intravenous contrast if GFR greater than 30). Electronically Signed   By: Iven Finn M.D.   On: 11/15/2021 19:26    ROS: Positive for vomiting, chills and lethargy.  Low-grade fever in the emergency room.  10 system review was otherwise negative. PE:  Blood pressure 108/74, pulse 84, temperature 97.9 F (36.6 C), temperature source Oral, resp. rate 19, height 5\' 2"  (1.575 m), weight 86.2 kg, SpO2 95 %. Well-nourished well-developed elderly woman in no apparent distress.  Alert and oriented.  Extraocular motions are intact.  Hard of hearing.  Respirations are unlabored.  The right foot has a significant bunion deformity as well as a second hammertoe.  The second toe is crossing over the hallux slightly.  There is an ulcer over the DIP joint with visible extensor tendon.  The joint is partially visible.  There is some erythema around the wound.  The pretibial area has a oblique longitudinal wound about 6 cm x 1 cm with some fibrinous exudate.  No signs of cellulitis at that wound.  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  At this point I believe it is medically necessary to consider surgical treatment.  Specifically she would need amputation of the right foot second toe through the MTP joint.  The patient and her daughter understand the risks and benefits of the alternative treatment  options and would like to proceed with surgical treatment.  We will get her scheduled for surgery tomorrow morning.  She can eat until then.  N.p.o. after midnight.  Hold blood thinners.  The risks and benefits of the alternative treatment options have been discussed in detail.  The patient wishes to proceed with surgery and specifically understands risks of bleeding, infection, nerve damage, blood clots, need for additional surgery, amputation and death.   Wylene Simmer 11/29/21, 1:37 PM

## 2021-11-16 NOTE — Assessment & Plan Note (Signed)
resolved 

## 2021-11-16 NOTE — Progress Notes (Signed)
PHARMACY - PHYSICIAN COMMUNICATION ?CRITICAL VALUE ALERT - BLOOD CULTURE IDENTIFICATION (BCID) ? ?[name] is an 88 y.o. female who presented to [facility] on 11/15/2021 with a chief complaint of  right foot swelling and erythema ? ?Assessment:  GPC aerobic, 1/4, staph epi, no Resist ? ?Name of physician (or Provider) Contacted:  x Blount ? ?Current antibiotics: vancomycin and cefepime ? ?Changes to prescribed antibiotics recommended:  ?none ? ?Results for orders placed or performed during the hospital encounter of 11/15/21  ?Blood Culture ID Panel (Reflexed) (Collected: 11/15/2021  6:10 PM)  ?Result Value Ref Range  ? Enterococcus faecalis NOT DETECTED NOT DETECTED  ? Enterococcus Faecium NOT DETECTED NOT DETECTED  ? Listeria monocytogenes NOT DETECTED NOT DETECTED  ? Staphylococcus species DETECTED (A) NOT DETECTED  ? Staphylococcus aureus (BCID) NOT DETECTED NOT DETECTED  ? Staphylococcus epidermidis DETECTED (A) NOT DETECTED  ? Staphylococcus lugdunensis NOT DETECTED NOT DETECTED  ? Streptococcus species NOT DETECTED NOT DETECTED  ? Streptococcus agalactiae NOT DETECTED NOT DETECTED  ? Streptococcus pneumoniae NOT DETECTED NOT DETECTED  ? Streptococcus pyogenes NOT DETECTED NOT DETECTED  ? A.calcoaceticus-baumannii NOT DETECTED NOT DETECTED  ? Bacteroides fragilis NOT DETECTED NOT DETECTED  ? Enterobacterales NOT DETECTED NOT DETECTED  ? Enterobacter cloacae complex NOT DETECTED NOT DETECTED  ? Escherichia coli NOT DETECTED NOT DETECTED  ? Klebsiella aerogenes NOT DETECTED NOT DETECTED  ? Klebsiella oxytoca NOT DETECTED NOT DETECTED  ? Klebsiella pneumoniae NOT DETECTED NOT DETECTED  ? Proteus species NOT DETECTED NOT DETECTED  ? Salmonella species NOT DETECTED NOT DETECTED  ? Serratia marcescens NOT DETECTED NOT DETECTED  ? Haemophilus influenzae NOT DETECTED NOT DETECTED  ? Neisseria meningitidis NOT DETECTED NOT DETECTED  ? Pseudomonas aeruginosa NOT DETECTED NOT DETECTED  ? Stenotrophomonas maltophilia  NOT DETECTED NOT DETECTED  ? Candida albicans NOT DETECTED NOT DETECTED  ? Candida auris NOT DETECTED NOT DETECTED  ? Candida glabrata NOT DETECTED NOT DETECTED  ? Candida krusei NOT DETECTED NOT DETECTED  ? Candida parapsilosis NOT DETECTED NOT DETECTED  ? Candida tropicalis NOT DETECTED NOT DETECTED  ? Cryptococcus neoformans/gattii NOT DETECTED NOT DETECTED  ? Methicillin resistance mecA/C NOT DETECTED NOT DETECTED  ? ?Dolly Rias RPh ?11/16/2021, 11:56 PM ? ?

## 2021-11-16 NOTE — Assessment & Plan Note (Signed)
Continue PPI ?

## 2021-11-16 NOTE — H&P (View-Only) (Signed)
Reason for Consult: Right second toe ulcer Referring Physician: Dr. Lajoyce Corners [name] is an 88 y.o. female.  HPI: 88 year old female with past medical history significant for recurrent pulmonary embolism requiring chronic anticoagulation was admitted to the hospital yesterday for right foot swelling and erythema.  She has had an ulcer on the dorsum of the second toe for many weeks.  She has a wound on the pretibial area of the right leg that happened after a bruise at the end of December.  This area has been slowly healing with wound care provided by a home health RN.  She has not been able to wear her compression stockings due to the wound on the leg.  As a result the dorsum of her second toe where she has a chronic hammertoe deformity has been rubbing the top of her shoe.  The ulcer has had a scab until just recently.  The scab sloughed off.  She began feeling poorly earlier this week.  There was suspicion for a urinary tract infection, and she was started on nitrofurantoin.  She began having increased swelling and redness of the right foot over the last couple of days.  She was found to be hypertensive yesterday and was brought to the emergency room.  Her white blood cell count was elevated along with her C-reactive protein.  She was started on antibiotics.  She is not diabetic.  She is not a smoker.  Her daughter is at bedside and provides much of the history.  The patient denies any pain at the toe.  She has been off of her Coumadin for a couple of days now.  Past Medical History:  Diagnosis Date   Anticoagulant long-term use    Arrhythmia    Arthritis    Atrial fibrillation (HCC)    Congestive heart failure (CHF) (HCC)    Dysphagia    Esophageal reflux    Essential hypertension    Hiatal hernia    Pulmonary embolus (HCC)    Thyroid disease     Past Surgical History:  Procedure Laterality Date   COLONOSCOPY     ELBOW SURGERY     LAPAROSCOPIC HYSTERECTOMY      Family History   Problem Relation Age of Onset   Stroke Mother    Cancer Brother     Social History:  reports that she quit smoking about 20 years ago. Her smoking use included cigarettes. She smoked an average of 1 pack per day. She has never used smokeless tobacco. No history on file for alcohol use and drug use.  Allergies:  Allergies  Allergen Reactions   Acetaminophen    Amoxicillin    Azithromycin     Other reaction(s): unsure   Codeine     Other reaction(s): unsure   Erythromycin    Erythromycin Ethylsuccinate    Green Dyes Other (See Comments)    Allergic to ALL dyes   Iodine    Misc. Sulfonamide Containing Compounds    Oxycodone    Oxycodone-Acetaminophen    Penicillins    Sulfasalazine    Tramadol Hives    Medications: I have reviewed the patient's current medications.  Results for orders placed or performed during the hospital encounter of 11/15/21 (from the past 48 hour(s))  CBC with Differential     Status: Abnormal   Collection Time: 11/15/21  5:43 PM  Result Value Ref Range   WBC 12.4 (H) 4.0 - 10.5 K/uL   RBC 3.86 (L) 3.87 - 5.11 MIL/uL  Hemoglobin 11.7 (L) 12.0 - 15.0 g/dL   HCT 35.9 (L) 36.0 - 46.0 %   MCV 93.0 80.0 - 100.0 fL   MCH 30.3 26.0 - 34.0 pg   MCHC 32.6 30.0 - 36.0 g/dL   RDW 14.3 11.5 - 15.5 %   Platelets 174 150 - 400 K/uL   nRBC 0.0 0.0 - 0.2 %   Neutrophils Relative % 92 %   Neutro Abs 11.3 (H) 1.7 - 7.7 K/uL   Lymphocytes Relative 2 %   Lymphs Abs 0.3 (L) 0.7 - 4.0 K/uL   Monocytes Relative 4 %   Monocytes Absolute 0.5 0.1 - 1.0 K/uL   Eosinophils Relative 2 %   Eosinophils Absolute 0.2 0.0 - 0.5 K/uL   Basophils Relative 0 %   Basophils Absolute 0.0 0.0 - 0.1 K/uL   Immature Granulocytes 0 %   Abs Immature Granulocytes 0.05 0.00 - 0.07 K/uL    Comment: Performed at [facility], [place] [address] [place], [place] [address]  Comprehensive metabolic panel     Status: Abnormal   Collection Time: 11/15/21  5:43 PM  Result  Value Ref Range   Sodium 130 (L) 135 - 145 mmol/L   Potassium 4.4 3.5 - 5.1 mmol/L   Chloride 95 (L) 98 - 111 mmol/L   CO2 26 22 - 32 mmol/L   Glucose, Bld 130 (H) 70 - 99 mg/dL    Comment: Glucose reference range applies only to samples taken after fasting for at least 8 hours.   BUN 24 (H) 8 - 23 mg/dL   Creatinine, Ser 1.29 (H) 0.44 - 1.00 mg/dL   Calcium 9.0 8.9 - 10.3 mg/dL   Total Protein 6.7 6.5 - 8.1 g/dL   Albumin 3.3 (L) 3.5 - 5.0 g/dL   AST 30 15 - 41 U/L   ALT 20 0 - 44 U/L   Alkaline Phosphatase 141 (H) 38 - 126 U/L   Total Bilirubin 0.8 0.3 - 1.2 mg/dL   GFR, Estimated 39 (L) >60 mL/min    Comment: (NOTE) Calculated using the CKD-EPI Creatinine Equation (2021)    Anion gap 9 5 - 15    Comment: Performed at [facility], [place] [address] [place], [place] [address]  Protime-INR     Status: Abnormal   Collection Time: 11/15/21  5:43 PM  Result Value Ref Range   Prothrombin Time 30.0 (H) 11.4 - 15.2 seconds   INR 2.9 (H) 0.8 - 1.2    Comment: (NOTE) INR goal varies based on device and disease states. Performed at [facility], [place] [address], [place], [place] [address]   Urinalysis, Routine w reflex microscopic Urine, Catheterized     Status: Abnormal   Collection Time: 11/15/21  5:43 PM  Result Value Ref Range   Color, Urine YELLOW YELLOW   APPearance CLEAR CLEAR   Specific Gravity, Urine 1.012 1.005 - 1.030   pH 5.0 5.0 - 8.0   Glucose, UA NEGATIVE NEGATIVE mg/dL   Hgb urine dipstick NEGATIVE NEGATIVE   Bilirubin Urine NEGATIVE NEGATIVE   Ketones, ur NEGATIVE NEGATIVE mg/dL   Protein, ur 30 (A) NEGATIVE mg/dL   Nitrite NEGATIVE NEGATIVE   Leukocytes,Ua NEGATIVE NEGATIVE   RBC / HPF 0-5 0 - 5 RBC/hpf   WBC, UA 0-5 0 - 5 WBC/hpf   Bacteria, UA RARE (A) NONE SEEN   Squamous Epithelial / LPF 0-5 0 - 5   Mucus PRESENT     Comment:  Performed at [facility], [place] [address] [place], [place] [address]   Lactic acid, plasma     Status: Abnormal   Collection Time: 11/15/21  5:44 PM  Result Value Ref Range   Lactic Acid, Venous 2.3 (HH) 0.5 - 1.9 mmol/L    Comment: CRITICAL RESULT CALLED TO, READ BACK BY AND VERIFIED WITH: TIFFANY, RN @ 1916 ON 11/15/2021 BY Ruffin Frederick, MLT Performed at [facility], [place] [address] [place], [place] [address]   Resp Panel by RT-PCR (Flu A&B, Covid) Nasopharyngeal Swab     Status: None   Collection Time: 11/15/21  5:44 PM   Specimen: Nasopharyngeal Swab; Nasopharyngeal(NP) swabs in vial transport medium  Result Value Ref Range   SARS Coronavirus 2 by RT PCR NEGATIVE NEGATIVE    Comment: (NOTE) SARS-CoV-2 target nucleic acids are NOT DETECTED.  The SARS-CoV-2 RNA is generally detectable in upper respiratory specimens during the acute phase of infection. The lowest concentration of SARS-CoV-2 viral copies this assay can detect is 138 copies/mL. A negative result does not preclude SARS-Cov-2 infection and should not be used as the sole basis for treatment or other patient management decisions. A negative result may occur with  improper specimen collection/handling, submission of specimen other than nasopharyngeal swab, presence of viral mutation(s) within the areas targeted by this assay, and inadequate number of viral copies(<138 copies/mL). A negative result must be combined with clinical observations, patient history, and epidemiological information. The expected result is Negative.  Fact Sheet for Patients:  EntrepreneurPulse.com.au  Fact Sheet for Healthcare Providers:  IncredibleEmployment.be  This test is no t yet approved or cleared by the [place] FDA and  has been authorized for detection and/or diagnosis of SARS-CoV-2 by FDA under an Emergency Use Authorization (EUA). This EUA will remain  in effect (meaning this test can be used) for the duration of the COVID-19 declaration under  Section 564(b)(1) of the Act, 21 U.S.C.section 360bbb-3(b)(1), unless the authorization is terminated  or revoked sooner.       Influenza A by PCR NEGATIVE NEGATIVE   Influenza B by PCR NEGATIVE NEGATIVE    Comment: (NOTE) The Xpert Xpress SARS-CoV-2/FLU/RSV plus assay is intended as an aid in the diagnosis of influenza from Nasopharyngeal swab specimens and should not be used as a sole basis for treatment. Nasal washings and aspirates are unacceptable for Xpert Xpress SARS-CoV-2/FLU/RSV testing.  Fact Sheet for Patients: EntrepreneurPulse.com.au  Fact Sheet for Healthcare Providers: IncredibleEmployment.be  This test is not yet approved or cleared by the [place] FDA and has been authorized for detection and/or diagnosis of SARS-CoV-2 by FDA under an Emergency Use Authorization (EUA). This EUA will remain in effect (meaning this test can be used) for the duration of the COVID-19 declaration under Section 564(b)(1) of the Act, 21 U.S.C. section 360bbb-3(b)(1), unless the authorization is terminated or revoked.  Performed at [facility], [place] [address] [place], [place] [address]   Blood culture (routine x 2)     Status: None (Preliminary result)   Collection Time: 11/15/21  6:00 PM   Specimen: BLOOD  Result Value Ref Range   Specimen Description      BLOOD RIGHT ANTECUBITAL Performed at [place] [address], [place],  [address]    Special Requests      BOTTLES DRAWN AEROBIC AND ANAEROBIC Blood Culture results may not be optimal due to an inadequate volume of blood received in culture bottles Performed at [facility], [place] [address] [place], [place] [address]    Culture      NO GROWTH < 12 HOURS Performed at [place] [address] [place], [place] [address]    Report Status PENDING   Blood culture (routine x 2)     Status: None (Preliminary  result)   Collection Time: 11/15/21  6:10 PM   Specimen: BLOOD  Result Value Ref Range   Specimen Description      BLOOD LEFT ANTECUBITAL Performed at [place] [address], [place], [place] [address]    Special Requests      BOTTLES DRAWN AEROBIC AND ANAEROBIC Blood Culture adequate volume Performed at [place] [address], [place], [place] [address]    Culture      NO GROWTH < 12 HOURS Performed at [place] [address] [place], [place] [address]    Report Status PENDING   Lactic acid, plasma     Status: None   Collection Time: 11/15/21 11:37 PM  Result Value Ref Range   Lactic Acid, Venous 1.7 0.5 - 1.9 mmol/L    Comment: Performed at [facility], [place] [address], [place], [place] [address]  C-reactive protein     Status: Abnormal   Collection Time: 11/15/21 11:37 PM  Result Value Ref Range   CRP 4.7 (H) <1.0 mg/dL    Comment: Performed at [place] [address], [place], [place] [address]  CBC     Status: Abnormal   Collection Time: 11/16/21  7:09 AM  Result Value Ref Range   WBC 10.4 4.0 - 10.5 K/uL   RBC 3.47 (L) 3.87 - 5.11 MIL/uL   Hemoglobin 10.5 (L) 12.0 - 15.0 g/dL   HCT 33.1 (L) 36.0 - 46.0 %   MCV 95.4 80.0 - 100.0 fL   MCH 30.3 26.0 - 34.0 pg   MCHC 31.7 30.0 - 36.0 g/dL   RDW 14.5 11.5 - 15.5 %   Platelets 144 (L) 150 - 400 K/uL    Comment: SPECIMEN CHECKED FOR CLOTS REPEATED TO VERIFY    nRBC 0.0 0.0 - 0.2 %    Comment: Performed at [facility], [place] [address], [place],  [address]  Comprehensive metabolic panel     Status: Abnormal   Collection Time: 11/16/21  7:09 AM  Result Value Ref Range   Sodium 130 (L) 135 - 145 mmol/L   Potassium 4.6 3.5 - 5.1 mmol/L   Chloride 97 (L) 98 - 111 mmol/L   CO2 24 22 - 32 mmol/L   Glucose, Bld 100 (H) 70 - 99 mg/dL    Comment: Glucose reference range applies only to samples taken after  fasting for at least 8 hours.   BUN 24 (H) 8 - 23 mg/dL   Creatinine, Ser 1.30 (H) 0.44 - 1.00 mg/dL   Calcium 8.8 (L) 8.9 - 10.3 mg/dL   Total Protein 5.9 (L) 6.5 - 8.1 g/dL   Albumin 2.9 (L) 3.5 - 5.0 g/dL   AST 26 15 - 41 U/L   ALT 18 0 - 44 U/L   Alkaline Phosphatase 112 38 - 126 U/L   Total Bilirubin 1.2 0.3 - 1.2 mg/dL   GFR, Estimated 39 (L) >60 mL/min    Comment: (NOTE) Calculated using the CKD-EPI Creatinine Equation (2021)    Anion gap 9 5 - 15    Comment: Performed at [facility], [address] [place], [place] [address]  °Protime-INR     Status: Abnormal  ° Collection Time: 11/16/21  7:09 AM  °Result Value Ref Range  ° Prothrombin Time 29.6 (H) 11.4 - 15.2 seconds  ° INR 2.8 (H) 0.8 - 1.2  °  Comment: (NOTE) °INR goal varies based on device and disease states. °Performed at [facility], [address] °[place], [place] [address] °  ° ° °DG Tibia/Fibula Right ° °Result Date: 11/15/2021 °CLINICAL DATA:  Wound. Leg pain. Allergic reaction to the lower extremities after taking an antibiotic. EXAM: RIGHT TIBIA AND FIBULA - 2 VIEW COMPARISON:  None. FINDINGS: There is diffuse decreased bone mineralization. Severe medial compartment of the knee joint space narrowing and peripheral osteophytosis degenerative change. No acute fracture is seen within the tibia or fibula. There are dense vascular calcifications. Mild anterior tibiotalar osteoarthritis. IMPRESSION: No acute fracture is seen. Severe medial knee compartment osteoarthritis. Electronically Signed   By: Ronald  Viola M.D.   On: 11/15/2021 19:25  ° °MR FOOT RIGHT WO CONTRAST ° °Result Date: 11/16/2021 °CLINICAL DATA:  Foot swelling EXAM: MRI OF THE RIGHT FOREFOOT WITHOUT CONTRAST TECHNIQUE: Multiplanar, multisequence MR imaging of the right forefoot was performed. No intravenous contrast was administered. COMPARISON:  Radiographs 11/15/2021 FINDINGS: Despite efforts by the technologist and patient,  motion artifact is present on today's exam and could not be eliminated. This reduces exam sensitivity and specificity. Bones/Joint/Cartilage On STIR images there is abnormal diffuse marrow edema in the middle and distal phalanges of the second toe as on image 17 series 6, suspicious for osteomyelitis. No substantial degree of proximal phalangeal edema is observed. There is much more ambiguous accentuated STIR signal in the middle and distal phalanges of the fourth and fifth toes as well as in the distal phalanx of the third toe. These findings in the third through fifth toes may well be due to field heterogeneity. Spurring along the head of the first metatarsal and along the adjacent sesamoids. Small erosion or degenerative subcortical cyst medially along the first metatarsal head. Ligaments Lisfranc ligament appears intact. Muscles and Tendons Regional muscular atrophy. Soft tissues Dorsal subcutaneous edema in the forefoot. This tracks into the toes. IMPRESSION: 1. Diffuse marrow edema in the middle and distal phalanges of the second toe suspicious for osteomyelitis. No other definite suspicious regions identified. 2. Degenerative findings along the first metatarsal head. Small erosion or degenerative subcortical cyst medially along the first metatarsal head. 3. Dorsal subcutaneous edema in the forefoot tracking into the toes. 4. Regional muscular atrophy. Electronically Signed   By: Walter  Liebkemann M.D.   On: 11/16/2021 08:18  ° °DG Chest Port 1 View ° °Result Date: 11/15/2021 °CLINICAL DATA:  Fever and cough.  Shortness of breath. EXAM: PORTABLE CHEST 1 VIEW COMPARISON:  08/02/2020 FINDINGS: Marked cardiac enlargement remains stable. Aortic atherosclerotic calcification noted. Both lungs are clear. IMPRESSION: Stable marked cardiac enlargement. No active lung disease. Electronically Signed   By: Cagney Degrace A Stahl M.D.   On: 11/15/2021 18:02  ° °DG Toe 2nd Right ° °Result Date: 11/15/2021 °CLINICAL DATA:  wound EXAM:  RIGHT SECOND TOE COMPARISON:  None. FINDINGS: Query vague cortical erosion along the medial base of the second digit proximal phalanx on frontal view. There is no evidence of fracture or dislocation. Degenerative changes of the first digit. Subcutaneus soft tissue edema of the second digit. Vascular calcification IMPRESSION: Query vague cortical erosion along the medial base of the second digit proximal phalanx. If   high clinical suspicion for osteomyelitis, recommend MRI for further evaluation (with intravenous contrast if GFR greater than 30). Electronically Signed   By: Iven Finn M.D.   On: 11/15/2021 19:26    ROS: Positive for vomiting, chills and lethargy.  Low-grade fever in the emergency room.  10 system review was otherwise negative. PE:  Blood pressure 108/74, pulse 84, temperature 97.9 F (36.6 C), temperature source Oral, resp. rate 19, height 5\' 2"  (1.575 m), weight 86.2 kg, SpO2 95 %. Well-nourished well-developed elderly woman in no apparent distress.  Alert and oriented.  Extraocular motions are intact.  Hard of hearing.  Respirations are unlabored.  The right foot has a significant bunion deformity as well as a second hammertoe.  The second toe is crossing over the hallux slightly.  There is an ulcer over the DIP joint with visible extensor tendon.  The joint is partially visible.  There is some erythema around the wound.  The pretibial area has a oblique longitudinal wound about 6 cm x 1 cm with some fibrinous exudate.  No signs of cellulitis at that wound.  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  At this point I believe it is medically necessary to consider surgical treatment.  Specifically she would need amputation of the right foot second toe through the MTP joint.  The patient and her daughter understand the risks and benefits of the alternative treatment  options and would like to proceed with surgical treatment.  We will get her scheduled for surgery tomorrow morning.  She can eat until then.  N.p.o. after midnight.  Hold blood thinners.  The risks and benefits of the alternative treatment options have been discussed in detail.  The patient wishes to proceed with surgery and specifically understands risks of bleeding, infection, nerve damage, blood clots, need for additional surgery, amputation and death.   Wylene Simmer Nov 26, 2021, 1:37 PM

## 2021-11-16 NOTE — Assessment & Plan Note (Signed)
Lower extremity cellulitis Right lower shin ulcer -X-ray of the right foot was concerning for right second toe osteomyelitis.  MRI is concerning for the same.  Currently on broad-spectrum antibiotics. -Spoke to Dr. Hewitt/orthopedics who recommended to hold Coumadin and make her n.p.o. for now. -Patient also has an ulcer in the right lower shin.  Tib-fib x-ray did not show fracture.

## 2021-11-16 NOTE — Assessment & Plan Note (Signed)
Continue Synthroid °

## 2021-11-16 NOTE — Progress Notes (Signed)
?  Progress Note ? ? ?Patient: [name] [id number] DOB: September 22, 1927 DOA: 11/15/2021     0 ?DOS: the patient was seen and examined on 11/16/2021 ?  ?Brief hospital course: ?88 y.o. female with medical history significant of A-fib, CHF, GERD, hypertension, pulmonary embolism, thyroid disease, COPD, pulmonary hypertension, anxiety, hyperlipidemia, venous insufficiency presented with leg wound and pain and generalized weakness.  On presentation, right foot x-ray showed concern for possible osteomyelitis and recommended MRI.  Patient was started on broad-spectrum antibiotics. ? ?Assessment and Plan: ?* Osteomyelitis of second toe of right foot ([place]) ?Lower extremity cellulitis ?Right lower shin ulcer ?-X-ray of the right foot was concerning for right second toe osteomyelitis.  MRI is concerning for the same.  Currently on broad-spectrum antibiotics. ?-Spoke to Dr. Hewitt/orthopedics who recommended to hold Coumadin and make her n.p.o. for now. ?-Patient also has an ulcer in the right lower shin.  Tib-fib x-ray did not show fracture. ? ?Chronic diastolic CHF (congestive heart failure) ([place]) ?Hypertension ?Chronic pulmonary hypertension ?- Currently compensated.  Strict input and output.  Daily weights.  Last echo in 2019 in [place] showed EF of greater than 70% with severe pulmonary hypertension and severe tricuspid regurgitation ?-Strict input and output.  Daily weights.  Fluid restriction.  Continue Bumex, losartan, metoprolol, spironolactone, outpatient follow-up with PCP/cardiology ? ?Permanent atrial fibrillation ([place]) ?- Hold Coumadin because of possible orthopedic procedure.  Continue metoprolol.  Outpatient follow-up with cardiology. ? ?CKD (chronic kidney disease), stage III ([place]) ?CKD IIIb ?-creatinine at baseline. Monitor ? ?Hyponatremia ?-mild. monitor ? ?Hypothyroidism ?- Continue Synthroid ? ?Gastroesophageal reflux disease ?- Continue PPI ? ?Leukocytosis ?-resolved ? ? ? ? ?  ? ?Subjective:   ?Patient seen and examined at bedside hard of hearing.  Complains of right foot pain.  No overnight fever, agitation, seizures reported. ? ?Physical Exam: ?Vitals:  ? 11/15/21 2341 11/16/21 0400 11/16/21 0748 11/16/21 0800  ?BP: (!) 161/72 131/69 108/74   ?Pulse: 83 89 84   ?Resp: 17 17 19    ?Temp: 98.3 ?F (36.8 ?C) (!) 97.5 ?F (36.4 ?C) 97.9 ?F (36.6 ?C)   ?TempSrc: Oral Oral Oral   ?SpO2: 95% 90% (!) 84% 95%  ?Weight:      ?Height:      ? ?General: No acute distress, currently on room air.  Hard of hearing. ?ENT/neck: No elevated JVD.  No obvious masses  ?respiratory: Bilateral decreased breath sounds at bases with some scattered crackles ?CVS: S1-S2 heard, rate controlled ?Abdominal: Soft, nontender, nondistended, no organomegaly, bowel sounds heard ?Extremities: No cyanosis, clubbing; trace lower extremity edema  ?CNS: Awake, extremely slow to respond, poor historian.  No focal neurologic deficit.  Moving extremities. ?Lymph: No cervical lymphadenopathy ?Skin: Right lower extremity edema with wound on the lower shin and right second toe  ?psych: Could not be assessed because of mental status  ?musculoskeletal: No obvious other joint deformity/tenderness/swelling ? ?Data Reviewed: ?I have reviewed patient's investigation including blood work and imaging done during this hospitalization myself.  Today sodium is 130, potassium 4.6, creatinine 1.3, WBC 10.4, hemoglobin 10.5, platelets 144, INR 2.8 ? ?Family Communication: spoke to daughter on phone. She confirmed that patient is DNR ? ?Disposition: ?Status is: Inpatient ?Remains inpatient appropriate because: Of severity of illness.  Need for IV antibiotics ? Planned Discharge Destination: Home ? ? ? ?Author: ?Aline August, MD ?11/16/2021 10:47 AM ? ?For on call review [email or web address].  ?

## 2021-11-16 NOTE — Hospital Course (Signed)
88 y.o. female with medical history significant of A-fib, CHF, GERD, hypertension, pulmonary embolism, thyroid disease, COPD, pulmonary hypertension, anxiety, hyperlipidemia, venous insufficiency presented with leg wound and pain and generalized weakness.  On presentation, right foot x-ray showed concern for possible osteomyelitis and recommended MRI.  Patient was started on broad-spectrum antibiotics.

## 2021-11-17 ENCOUNTER — Inpatient Hospital Stay (HOSPITAL_COMMUNITY): Payer: Medicare HMO | Admitting: Certified Registered Nurse Anesthetist

## 2021-11-17 ENCOUNTER — Encounter (HOSPITAL_COMMUNITY)
Admission: EM | Disposition: A | Discharge status: Home / Self Care | Payer: Self-pay | Source: Home / Self Care | Attending: Internal Medicine

## 2021-11-17 ENCOUNTER — Other Ambulatory Visit: Payer: Self-pay

## 2021-11-17 DIAGNOSIS — N179 Acute kidney failure, unspecified: Secondary | ICD-10-CM

## 2021-11-17 DIAGNOSIS — D638 Anemia in other chronic diseases classified elsewhere: Secondary | ICD-10-CM | POA: Diagnosis not present

## 2021-11-17 DIAGNOSIS — D649 Anemia, unspecified: Secondary | ICD-10-CM

## 2021-11-17 DIAGNOSIS — I2724 Chronic thromboembolic pulmonary hypertension: Secondary | ICD-10-CM | POA: Diagnosis not present

## 2021-11-17 DIAGNOSIS — M869 Osteomyelitis, unspecified: Secondary | ICD-10-CM

## 2021-11-17 DIAGNOSIS — D696 Thrombocytopenia, unspecified: Secondary | ICD-10-CM

## 2021-11-17 DIAGNOSIS — I110 Hypertensive heart disease with heart failure: Secondary | ICD-10-CM

## 2021-11-17 DIAGNOSIS — I4891 Unspecified atrial fibrillation: Secondary | ICD-10-CM

## 2021-11-17 DIAGNOSIS — I5032 Chronic diastolic (congestive) heart failure: Secondary | ICD-10-CM | POA: Diagnosis not present

## 2021-11-17 DIAGNOSIS — I509 Heart failure, unspecified: Secondary | ICD-10-CM

## 2021-11-17 HISTORY — PX: AMPUTATION TOE: SHX6595

## 2021-11-17 LAB — BASIC METABOLIC PANEL
Anion gap: 10 (ref 5–15)
BUN: 32 mg/dL — ABNORMAL HIGH (ref 8–23)
CO2: 21 mmol/L — ABNORMAL LOW (ref 22–32)
Calcium: 8.8 mg/dL — ABNORMAL LOW (ref 8.9–10.3)
Chloride: 95 mmol/L — ABNORMAL LOW (ref 98–111)
Creatinine, Ser: 1.94 mg/dL — ABNORMAL HIGH (ref 0.44–1.00)
GFR, Estimated: 24 mL/min — ABNORMAL LOW (ref >60)
Glucose, Bld: 104 mg/dL — ABNORMAL HIGH (ref 70–99)
Potassium: 4.4 mmol/L (ref 3.5–5.1)
Sodium: 126 mmol/L — ABNORMAL LOW (ref 135–145)

## 2021-11-17 LAB — CBC WITH DIFFERENTIAL/PLATELET
Abs Immature Granulocytes: 0.05 10*3/uL (ref 0.00–0.07)
Basophils Absolute: 0.1 10*3/uL (ref 0.0–0.1)
Basophils Relative: 1 %
Eosinophils Absolute: 0.9 10*3/uL — ABNORMAL HIGH (ref 0.0–0.5)
Eosinophils Relative: 14 %
HCT: 34.4 % — ABNORMAL LOW (ref 36.0–46.0)
Hemoglobin: 10.8 g/dL — ABNORMAL LOW (ref 12.0–15.0)
Immature Granulocytes: 1 %
Lymphocytes Relative: 10 %
Lymphs Abs: 0.7 10*3/uL (ref 0.7–4.0)
MCH: 30.3 pg (ref 26.0–34.0)
MCHC: 31.4 g/dL (ref 30.0–36.0)
MCV: 96.4 fL (ref 80.0–100.0)
Monocytes Absolute: 0.7 10*3/uL (ref 0.1–1.0)
Monocytes Relative: 10 %
Neutro Abs: 4.4 10*3/uL (ref 1.7–7.7)
Neutrophils Relative %: 64 %
Platelets: 135 10*3/uL — ABNORMAL LOW (ref 150–400)
RBC: 3.57 MIL/uL — ABNORMAL LOW (ref 3.87–5.11)
RDW: 14.7 % (ref 11.5–15.5)
WBC: 6.9 10*3/uL (ref 4.0–10.5)
nRBC: 0 % (ref 0.0–0.2)

## 2021-11-17 LAB — PROTIME-INR
INR: 2.8 — ABNORMAL HIGH (ref 0.8–1.2)
Prothrombin Time: 29.2 seconds — ABNORMAL HIGH (ref 11.4–15.2)

## 2021-11-17 LAB — MAGNESIUM: Magnesium: 2.1 mg/dL (ref 1.7–2.4)

## 2021-11-17 SURGERY — AMPUTATION, TOE
Anesthesia: Monitor Anesthesia Care | Site: Toe | Laterality: Right

## 2021-11-17 MED ORDER — BUPIVACAINE-EPINEPHRINE (PF) 0.5% -1:200000 IJ SOLN
INTRAMUSCULAR | Status: AC
  Filled 2021-11-17: qty 30

## 2021-11-17 MED ORDER — SODIUM CHLORIDE 0.9 % IV SOLN: INTRAVENOUS | Status: DC

## 2021-11-17 MED ORDER — METOCLOPRAMIDE HCL 5 MG/ML IJ SOLN: 5.0000 mg | Freq: Three times a day (TID) | INTRAMUSCULAR | Status: DC | PRN

## 2021-11-17 MED ORDER — 0.9 % SODIUM CHLORIDE (POUR BTL) OPTIME
TOPICAL | Status: DC | PRN
  Administered 2021-11-17: 1000 mL

## 2021-11-17 MED ORDER — FENTANYL CITRATE PF 50 MCG/ML IJ SOSY: 25.0000 ug | PREFILLED_SYRINGE | INTRAMUSCULAR | Status: DC | PRN

## 2021-11-17 MED ORDER — CEFAZOLIN SODIUM-DEXTROSE 2-4 GM/100ML-% IV SOLN
INTRAVENOUS | Status: AC
  Filled 2021-11-17: qty 100

## 2021-11-17 MED ORDER — PROPOFOL 10 MG/ML IV BOLUS
INTRAVENOUS | Status: DC | PRN
  Administered 2021-11-17: 10 mg via INTRAVENOUS
  Administered 2021-11-17: 20 mg via INTRAVENOUS
  Administered 2021-11-17 (×6): 10 mg via INTRAVENOUS

## 2021-11-17 MED ORDER — HYDRALAZINE HCL 25 MG PO TABS
25.0000 mg | ORAL_TABLET | Freq: Four times a day (QID) | ORAL | Status: DC | PRN
  Administered 2021-11-17 – 2021-11-20 (×3): 25 mg via ORAL
  Filled 2021-11-17 (×4): qty 1

## 2021-11-17 MED ORDER — BUPIVACAINE-EPINEPHRINE 0.5% -1:200000 IJ SOLN
INTRAMUSCULAR | Status: DC | PRN
  Administered 2021-11-17: 10 mL

## 2021-11-17 MED ORDER — VANCOMYCIN HCL 750 MG/150ML IV SOLN
750.0000 mg | INTRAVENOUS | Status: DC
  Administered 2021-11-17: 750 mg via INTRAVENOUS
  Filled 2021-11-17: qty 150

## 2021-11-17 MED ORDER — ONDANSETRON HCL 4 MG/2ML IJ SOLN
INTRAMUSCULAR | Status: AC
  Filled 2021-11-17: qty 2

## 2021-11-17 MED ORDER — ONDANSETRON HCL 4 MG/2ML IJ SOLN
INTRAMUSCULAR | Status: DC | PRN
  Administered 2021-11-17: 4 mg via INTRAVENOUS

## 2021-11-17 MED ORDER — LACTATED RINGERS IV SOLN: INTRAVENOUS | Status: DC | PRN

## 2021-11-17 MED ORDER — LIDOCAINE HCL (PF) 2 % IJ SOLN
INTRAMUSCULAR | Status: AC
  Filled 2021-11-17: qty 5

## 2021-11-17 MED ORDER — ONDANSETRON HCL 4 MG PO TABS: 4.0000 mg | ORAL_TABLET | Freq: Four times a day (QID) | ORAL | Status: DC | PRN

## 2021-11-17 MED ORDER — LACTATED RINGERS IV SOLN: INTRAVENOUS | Status: DC

## 2021-11-17 MED ORDER — ONDANSETRON HCL 4 MG/2ML IJ SOLN: 4.0000 mg | Freq: Four times a day (QID) | INTRAMUSCULAR | Status: DC | PRN

## 2021-11-17 MED ORDER — LIDOCAINE HCL (CARDIAC) PF 100 MG/5ML IV SOSY
PREFILLED_SYRINGE | INTRAVENOUS | Status: DC | PRN
  Administered 2021-11-17: 20 mg via INTRAVENOUS

## 2021-11-17 MED ORDER — EPHEDRINE SULFATE-NACL 50-0.9 MG/10ML-% IV SOSY
PREFILLED_SYRINGE | INTRAVENOUS | Status: DC | PRN
  Administered 2021-11-17: 5 mg via INTRAVENOUS

## 2021-11-17 MED ORDER — METOCLOPRAMIDE HCL 5 MG PO TABS: 5.0000 mg | ORAL_TABLET | Freq: Three times a day (TID) | ORAL | Status: DC | PRN

## 2021-11-17 MED ORDER — PROPOFOL 10 MG/ML IV BOLUS
INTRAVENOUS | Status: AC
  Filled 2021-11-17: qty 20

## 2021-11-17 MED ORDER — DOCUSATE SODIUM 100 MG PO CAPS
100.0000 mg | ORAL_CAPSULE | Freq: Two times a day (BID) | ORAL | Status: DC
  Administered 2021-11-17 – 2021-11-21 (×8): 100 mg via ORAL
  Filled 2021-11-17 (×9): qty 1

## 2021-11-17 SURGICAL SUPPLY — 57 items
BAG COUNTER SPONGE SURGICOUNT (BAG) IMPLANT
BAG SURGICOUNT SPONGE COUNTING (BAG)
BLADE OSCILLATING/SAGITTAL (BLADE)
BLADE SURG 15 STRL LF DISP TIS (BLADE) ×1 IMPLANT
BLADE SURG 15 STRL SS (BLADE) ×2
BLADE SW THK.38XMED LNG THN (BLADE) IMPLANT
BNDG COHESIVE 4X5 TAN ST LF (GAUZE/BANDAGES/DRESSINGS) ×3 IMPLANT
BNDG ELASTIC 4X5.8 VLCR STR LF (GAUZE/BANDAGES/DRESSINGS) ×2 IMPLANT
BNDG ESMARK 4X9 LF (GAUZE/BANDAGES/DRESSINGS) ×3 IMPLANT
COVER BACK TABLE 60X90IN (DRAPES) ×3 IMPLANT
CUFF TOURN SGL QUICK 24 (TOURNIQUET CUFF)
CUFF TRNQT CYL 24X4X16.5-23 (TOURNIQUET CUFF) IMPLANT
DRAPE SHEET LG 3/4 BI-LAMINATE (DRAPES) ×6 IMPLANT
DRAPE U-SHAPE 47X51 STRL (DRAPES) ×3 IMPLANT
DRSG ADAPTIC 3X8 NADH LF (GAUZE/BANDAGES/DRESSINGS) ×2 IMPLANT
DRSG EMULSION OIL 3X3 NADH (GAUZE/BANDAGES/DRESSINGS) IMPLANT
DRSG MEPITEL 4X7.2 (GAUZE/BANDAGES/DRESSINGS) ×3 IMPLANT
DRSG PAD ABDOMINAL 8X10 ST (GAUZE/BANDAGES/DRESSINGS) ×2 IMPLANT
DURAPREP 26ML APPLICATOR (WOUND CARE) ×3 IMPLANT
ELECT REM PT RETURN 15FT ADLT (MISCELLANEOUS) ×3 IMPLANT
GAUZE SPONGE 4X4 12PLY STRL (GAUZE/BANDAGES/DRESSINGS) ×3 IMPLANT
GLOVE SRG 8 PF TXTR STRL LF DI (GLOVE) ×2 IMPLANT
GLOVE SURG ENC MOIS LTX SZ8 (GLOVE) ×3 IMPLANT
GLOVE SURG NEOPR MICRO LF SZ8 (GLOVE) ×3 IMPLANT
GLOVE SURG UNDER POLY LF SZ8 (GLOVE) ×4
GOWN STRL REUS W/ TWL LRG LVL3 (GOWN DISPOSABLE) ×1 IMPLANT
GOWN STRL REUS W/ TWL XL LVL3 (GOWN DISPOSABLE) ×1 IMPLANT
GOWN STRL REUS W/TWL LRG LVL3 (GOWN DISPOSABLE) ×2
GOWN STRL REUS W/TWL XL LVL3 (GOWN DISPOSABLE) ×2
KIT BASIN OR (CUSTOM PROCEDURE TRAY) ×3 IMPLANT
KIT TURNOVER KIT A (KITS) IMPLANT
NDL HYPO 25X1 1.5 SAFETY (NEEDLE) IMPLANT
NDL SAFETY ECLIPSE 18X1.5 (NEEDLE) IMPLANT
NEEDLE HYPO 18GX1.5 SHARP (NEEDLE)
NEEDLE HYPO 25X1 1.5 SAFETY (NEEDLE) IMPLANT
NS IRRIG 1000ML POUR BTL (IV SOLUTION) ×3 IMPLANT
PADDING CAST COTTON 6X4 STRL (CAST SUPPLIES) ×2 IMPLANT
PENCIL SMOKE EVACUATOR (MISCELLANEOUS) IMPLANT
PROTECTOR NERVE ULNAR (MISCELLANEOUS) ×3 IMPLANT
SANITIZER HAND PURELL 535ML FO (MISCELLANEOUS) ×3 IMPLANT
SLEEVE SCD COMPRESS KNEE MED (STOCKING) ×3 IMPLANT
SPIKE FLUID TRANSFER (MISCELLANEOUS) IMPLANT
SPONGE T-LAP 18X18 ~~LOC~~+RFID (SPONGE) ×3 IMPLANT
STOCKINETTE 6  STRL (DRAPES) ×2
STOCKINETTE 6 STRL (DRAPES) ×1 IMPLANT
SUCTION FRAZIER HANDLE 10FR (MISCELLANEOUS)
SUCTION TUBE FRAZIER 10FR DISP (MISCELLANEOUS) IMPLANT
SUT ETHILON 2 0 PS N (SUTURE) IMPLANT
SUT ETHILON 2 0 PSLX (SUTURE) ×3 IMPLANT
SUT MNCRL AB 3-0 PS2 18 (SUTURE) IMPLANT
SWAB COLLECTION DEVICE MRSA (MISCELLANEOUS) IMPLANT
SWAB CULTURE ESWAB REG 1ML (MISCELLANEOUS) IMPLANT
SYR BULB EAR ULCER 3OZ GRN STR (SYRINGE) ×3 IMPLANT
SYR CONTROL 10ML LL (SYRINGE) IMPLANT
TUBING CONNECTING 10 (TUBING) IMPLANT
TUBING CONNECTING 10' (TUBING)
UNDERPAD 30X36 HEAVY ABSORB (UNDERPADS AND DIAPERS) ×3 IMPLANT

## 2021-11-17 NOTE — Progress Notes (Signed)
Orthopedic Tech Progress Note ?Patient Details:  ?[name] ?12-May-1928 ?[id number] ? ?Ortho Devices ?Type of Ortho Device: Postop shoe/boot ?Ortho Device/Splint Location: right ?Ortho Device/Splint Interventions: Application ?  ?Post Interventions ?Patient Tolerated: Well ?Instructions Provided: Care of device ? ?Saul Fordyce ?11/17/2021, 8:31 AM ? ?

## 2021-11-17 NOTE — Transfer of Care (Signed)
Immediate Anesthesia Transfer of Care Note ? ?Patient: [name] ? ?Procedure(s) Performed: RIGHT SECOND TOE AMPUTATION (Right: Toe) ? ?Patient Location: PACU ? ?Anesthesia Type:MAC ? ?Level of Consciousness: awake, alert , oriented, drowsy and patient cooperative ? ?Airway & Oxygen Therapy: Patient Spontanous Breathing and Patient connected to face mask oxygen ? ?Post-op Assessment: Report given to RN and Post -op Vital signs reviewed and stable ? ?Post vital signs: Reviewed and stable ? ?Last Vitals:  ?Vitals Value Taken Time  ?BP 166/68 11/17/21 0808  ?Temp 36.5 ?C 11/17/21 0808  ?Pulse 49 11/17/21 0813  ?Resp 19 11/17/21 0813  ?SpO2 94 % 11/17/21 0813  ?Vitals shown include unvalidated device data. ? ?Last Pain:  ?Vitals:  ? 11/17/21 0510  ?TempSrc: Oral  ?PainSc:   ?   ? ?Patients Stated Pain Goal: 0 (11/16/21 0800) ? ?Complications: No notable events documented. ?

## 2021-11-17 NOTE — Anesthesia Postprocedure Evaluation (Signed)
Anesthesia Post Note ? ?Patient: IVETTE CASTRONOVA ? ?Procedure(s) Performed: RIGHT SECOND TOE AMPUTATION (Right: Toe) ? ?  ? ?Patient location during evaluation: PACU ?Anesthesia Type: MAC ?Level of consciousness: awake and alert ?Pain management: pain level controlled ?Vital Signs Assessment: post-procedure vital signs reviewed and stable ?Respiratory status: spontaneous breathing, nonlabored ventilation, respiratory function stable and patient connected to nasal cannula oxygen ?Cardiovascular status: blood pressure returned to baseline and stable ?Postop Assessment: no apparent nausea or vomiting ?Anesthetic complications: no ? ? ?No notable events documented. ? ?Last Vitals:  ?Vitals:  ? 11/17/21 0830 11/17/21 0845  ?BP: (!) 183/69   ?Pulse: (!) 51 (!) 47  ?Resp: 18 (!) 7  ?Temp:  36.5 ?C  ?SpO2: 97% 91%  ?  ?Last Pain:  ?Vitals:  ? 11/17/21 0845  ?TempSrc:   ?PainSc: 0-No pain  ? ? ?  ?  ?  ?  ?  ?  ? ?Estoria Geary L Tiah Heckel ? ? ? ? ?

## 2021-11-17 NOTE — Evaluation (Signed)
Physical Therapy Evaluation ?Patient Details ?Name: [name] ?MRN: [id number] ?DOB: December 02, 1927 ?Today's Date: 11/17/2021 ? ?History of Present Illness ? 88 yo female admitted with osteomyelitis. S/P R 2nd toe amputation 11/17/21.  ?Clinical Impression ? On eval, pt required Min-Mod A for mobility. She walked ~10 feet with a RW. Dyspnea 2/4 with activity. Will plan to follow and progress activity as tolerated.    ?   ? ?Recommendations for follow up therapy are one component of a multi-disciplinary discharge planning process, led by the attending physician.  Recommendations may be updated based on patient status, additional functional criteria and insurance authorization. ? ?Follow Up Recommendations Home health PT ? ?  ?Assistance Recommended at Discharge Frequent or constant Supervision/Assistance  ?Patient can return home with the following ? A little help with walking and/or transfers;A little help with bathing/dressing/bathroom;Assistance with cooking/housework;Assist for transportation;Help with stairs or ramp for entrance ? ?  ?Equipment Recommendations None recommended by PT  ?Recommendations for Other Services ?    ?  ?Functional Status Assessment Patient has had a recent decline in their functional status and demonstrates the ability to make significant improvements in function in a reasonable and predictable amount of time.  ? ?  ?Precautions / Restrictions Precautions ?Precautions: Fall ?Precaution Comments: post op shoe on R, regular shoe on L foot ?Restrictions ?Weight Bearing Restrictions: No ?Other Position/Activity Restrictions: WBAT with post op shoe and RW  ? ?  ? ?Mobility ? Bed Mobility ?Overal bed mobility: Needs Assistance ?Bed Mobility: Supine to Sit, Sit to Supine ?  ?  ?Supine to sit: Min guard, HOB elevated ?Sit to supine: Min guard, HOB elevated ?  ?General bed mobility comments: Min guard or safety, lines. ?  ? ?Transfers ?Overall transfer level: Needs assistance ?Equipment used: Rolling  walker (2 wheels) ?Transfers: Sit to/from Stand, Bed to chair/wheelchair/BSC ?Sit to Stand: Mod assist ?  ?Step pivot transfers: Min assist ?  ?  ?  ?General transfer comment: Assist to power up, stabilize, control descent. Cues for safey, hand/feet placement, technique. Poor anterior weightshift at times.  Pt prefers to brace forearm on handle and hold to front of walker to stand up. ?  ? ?Ambulation/Gait ?Ambulation/Gait assistance: Min assist ?Gait Distance (Feet): 10 Feet ?Assistive device: Rolling walker (2 wheels) ?Gait Pattern/deviations: Step-through pattern, Decreased stride length ?  ?  ?  ?General Gait Details: walked across room and back with RW. Min a to steady. ? ?Stairs ?  ?  ?  ?  ?  ? ?Wheelchair Mobility ?  ? ?Modified Rankin (Stroke Patients Only) ?  ? ?  ? ?Balance Overall balance assessment: Needs assistance ?  ?  ?  ?  ?Standing balance support: Bilateral upper extremity supported, During functional activity, Reliant on assistive device for balance ?Standing balance-Leahy Scale: Poor ?  ?  ?  ?  ?  ?  ?  ?  ?  ?  ?  ?  ?   ? ? ? ?Pertinent Vitals/Pain Pain Assessment ?Pain Assessment: No/denies pain  ? ? ?Home Living Family/patient expects to be discharged to:: Private residence ?Living Arrangements: Children ?Available Help at Discharge: Family ?Type of Home: House ?Home Access:  (incline) ?  ?  ?  ?Home Layout: One level ?Home Equipment: Conservation officer, nature (2 wheels) ?   ?  ?Prior Function Prior Level of Function : Independent/Modified Independent ?  ?  ?  ?  ?  ?  ?  ?  ?  ? ? ?Hand  Dominance  ?   ? ?  ?Extremity/Trunk Assessment  ? Upper Extremity Assessment ?Upper Extremity Assessment: Defer to OT evaluation ?  ? ?Lower Extremity Assessment ?Lower Extremity Assessment: Generalized weakness ?  ? ?Cervical / Trunk Assessment ?Cervical / Trunk Assessment: Normal  ?Communication  ? Communication: No difficulties  ?Cognition Arousal/Alertness: Awake/alert ?Behavior During Therapy: [facility] for tasks  assessed/performed ?Overall Cognitive Status: Within Functional Limits for tasks assessed ?  ?  ?  ?  ?  ?  ?  ?  ?  ?  ?  ?  ?  ?  ?  ?  ?  ?  ?  ? ?  ?General Comments   ? ?  ?Exercises    ? ?Assessment/Plan  ?  ?PT Assessment Patient needs continued PT services  ?PT Problem List Decreased strength;Decreased mobility;Decreased activity tolerance;Decreased balance;Decreased knowledge of use of DME ? ?   ?  ?PT Treatment Interventions DME instruction;Gait training;Therapeutic exercise;Balance training;Functional mobility training;Therapeutic activities;Patient/family education   ? ?PT Goals (Current goals can be found in the Care Plan section)  ?Acute Rehab PT Goals ?Patient Stated Goal: home soon ?PT Goal Formulation: With patient ?Time For Goal Achievement: 12/01/21 ?Potential to Achieve Goals: Good ? ?  ?Frequency Min 3X/week ?  ? ? ?Co-evaluation   ?  ?  ?  ?  ? ? ?  ?AM-PAC PT "6 Clicks" Mobility  ?Outcome Measure Help needed turning from your back to your side while in a flat bed without using bedrails?: A Little ?Help needed moving from lying on your back to sitting on the side of a flat bed without using bedrails?: A Little ?Help needed moving to and from a bed to a chair (including a wheelchair)?: A Little ?Help needed standing up from a chair using your arms (e.g., wheelchair or bedside chair)?: A Lot ?Help needed to walk in hospital room?: A Little ?Help needed climbing 3-5 steps with a railing? : A Lot ?6 Click Score: 16 ? ?  ?End of Session Equipment Utilized During Treatment: Gait belt ?Activity Tolerance: Patient tolerated treatment well ?Patient left: in bed;with call bell/phone within reach;with bed alarm set ?  ?PT Visit Diagnosis: Muscle weakness (generalized) (M62.81);Difficulty in walking, not elsewhere classified (R26.2) ?  ? ?Time: [id number] ?PT Time Calculation (min) (ACUTE ONLY): 34 min ? ? ?Charges:   PT Evaluation ?$PT Eval Moderate Complexity: 1 Mod ?PT Treatments ?$Gait Training: 8-22  mins ?  ?   ? ? ? ? ? ?Doreatha Massed, PT ?Acute Rehabilitation  ?Office: [phone] ?Pager: [phone] ? ?  ? ?

## 2021-11-17 NOTE — Interval H&P Note (Signed)
History and Physical Interval Note: ? ?11/17/2021 ?7:28 AM ? ?[name]  has presented today for surgery, with the diagnosis of RIGHT SECOND TOE OSTEOMYELITIS.  The various methods of treatment have been discussed with the patient and family. After consideration of risks, benefits and other options for treatment, the patient has consented to  Procedure(s): ?AMPUTATION TOE (Right) (second toe) as a surgical intervention.  The patient's history has been reviewed, patient examined, no change in status, stable for surgery.  I have reviewed the patient's chart and labs.  Questions were answered to the patient's satisfaction.   ? ?The risks and benefits of the alternative treatment options have been discussed in detail.  The patient wishes to proceed with surgery and specifically understands risks of bleeding, infection, nerve damage, blood clots, need for additional surgery, amputation and death.  ?Wylene Simmer ? ? ?

## 2021-11-17 NOTE — Progress Notes (Signed)
?Progress Note ? ? ?Patient: [name] [id number] DOB: March 26, 1928 DOA: 11/15/2021     1 ?DOS: the patient was seen and examined on 11/17/2021 ?  ?Brief hospital course: ?88 y.o. female with medical history significant of A-fib, CHF, GERD, hypertension, pulmonary embolism, thyroid disease, COPD, pulmonary hypertension, anxiety, hyperlipidemia, venous insufficiency presented with leg wound and pain and generalized weakness.  On presentation, right foot x-ray showed concern for possible osteomyelitis and recommended MRI.  Patient was started on broad-spectrum antibiotics.  Orthopedics was consulted ? ?Assessment and Plan: ?* Osteomyelitis of second toe of right foot ([place]) ?Lower extremity cellulitis ?Right lower shin ulcer ?-X-ray of the right foot was concerning for right second toe osteomyelitis.  MRI is concerning for the same.  Currently on broad-spectrum antibiotics. ?-Orthopedics following.  Plan for possible right second toe amputation today ?-Patient also has an ulcer in the right lower shin.  Tib-fib x-ray did not show fracture. ? ?Chronic diastolic CHF (congestive heart failure) ([place]) ?Hypertension ?Chronic pulmonary hypertension ?- Currently compensated.  Strict input and output.  Daily weights.  Last echo in 2019 in [place] showed EF of greater than 70% with severe pulmonary hypertension and severe tricuspid regurgitation ?-Strict input and output.  Daily weights.  Fluid restriction.  Continue metoprolol.  Hold losartan, Bumex, spironolactone today because of acute kidney injury.  Outpatient follow-up with PCP/cardiology ? ?Staph epidermidis bacteremia ?-Only in 1 bottle.  Possible contaminant. ? ?Permanent atrial fibrillation ([place]) ?-Coumadin on hold.  Continue metoprolol.  Outpatient follow-up with cardiology. ? ?Acute kidney injury on CKD (chronic kidney disease), stage III ([place]) ?CKD IIIb ?-Creatinine has worsened to 1.94 today.  Hold diuretics and losartan.  IV fluids normal saline at 75 cc  an hour for today.  Repeat a.m. labs. ? ?Hyponatremia ?-Worsening.  Sodium 126 today. ? ?Hypothyroidism ?- Continue Synthroid ? ?Gastroesophageal reflux disease ?- Continue PPI ? ?Leukocytosis ?-resolved ? ?Anemia of chronic disease ?-Hemoglobin stable ? ?Thrombocytopenia ?-Mild.  No signs of bleeding.  Monitor intermittently. ? ? ? ?  ? ?Subjective:  ?Patient seen and examined at bedside.  Poor historian.  Hard of hearing.  No agitation, seizures, vomiting reported.  Still complains of lower extremity pain. ?Physical Exam: ?Vitals:  ? 11/17/21 0645 11/17/21 [id number] 11/17/21 0815 11/17/21 0826  ?BP: (!) 154/63 (!) 166/68 (!) 172/65   ?Pulse:  70 (!) 53 (!) 52  ?Resp: (!) 26 18 (!) 26 (!) 24  ?Temp: 97.8 ?F (36.6 ?C) 97.7 ?F (36.5 ?C)    ?TempSrc:      ?SpO2: 94% 91% 96% 93%  ?Weight:      ?Height:      ? ?General: On room air.  No distress.  Elderly female lying in bed.  Extremely hard of hearing.  ENT/neck: No thyromegaly.  JVD is not elevated  ?respiratory: Decreased breath sounds at bases bilaterally with some crackles; no wheezing.  Intermittently tachypneic  ?CVS: S1-S2 heard, intermittently bradycardic  ?abdominal: Soft, nontender, slightly distended; no organomegaly, normal bowel sounds are heard ?Extremities: Trace lower extremity edema; no cyanosis  ?CNS: Awake, extremely slow to respond.  Very poor historian.  No focal neurologic deficit.  Moves extremities ?Lymph: No obvious lymphadenopathy ?Skin:Right lower extremity edema with wound on the lower shin and right second toe.  No obvious petechiae ?psych: Affect is mostly flat.  Currently not agitated.   ?Musculoskeletal: No obvious joint swelling/deformity ? ? ?Data Reviewed: ?I have reviewed patient's investigation including blood work and imaging done during this hospitalization myself.  Today sodium is 126, creatinine 1.94, WBC 6.9, hemoglobin 10.8, platelets 135, INR 2.8 ?Family Communication: spoke to daughter and granddaughter at  bedside ? ?Disposition: ?Status is: Inpatient ?Remains inpatient appropriate because: Of severity of illness.  Need for IV antibiotics ? Planned Discharge Destination: Home ? ? ? ?Author: ?Aline August, MD ?11/17/2021 8:31 AM ? ?For on call review [email or web address].  ?

## 2021-11-17 NOTE — Anesthesia Preprocedure Evaluation (Addendum)
Anesthesia Evaluation  ?Patient identified by MRN, date of birth, ID band ?Patient awake ? ? ? ?Reviewed: ?Allergy & Precautions, NPO status , Patient's Chart, lab work & pertinent test results, reviewed documented beta blocker date and time  ? ?Airway ?Mallampati: IV ? ?TM Distance: >3 FB ?Neck ROM: Full ? ?Mouth opening: Limited Mouth Opening ? Dental ? ?(+) Edentulous Upper, Edentulous Lower ?  ?Pulmonary ?COPD, former smoker, PE ?  ?Pulmonary exam normal ?breath sounds clear to auscultation ? ? ? ? ? ? Cardiovascular ?hypertension, Pt. on home beta blockers and Pt. on medications ?pulmonary hypertension+CHF  ?Normal cardiovascular exam+ dysrhythmias Atrial Fibrillation  ?Rhythm:Regular Rate:Normal ? ?TTE 2019 ?Normal EF, severe pulm HTN, severe TR ?  ?Neuro/Psych ?PSYCHIATRIC DISORDERS Anxiety negative neurological ROS ?   ? GI/Hepatic ?Neg liver ROS, hiatal hernia, GERD  ,  ?Endo/Other  ?Hypothyroidism  ? Renal/GU ?Renal disease  ?negative genitourinary ?  ?Musculoskeletal ? ?(+) Arthritis ,  ? Abdominal ?  ?Peds ? Hematology ?negative hematology ROS ?(+)   ?Anesthesia Other Findings ? ? Reproductive/Obstetrics ? ?  ? ? ? ? ? ? ? ? ? ? ? ? ? ?  ?  ? ? ? ? ? ? ? ?Anesthesia Physical ?Anesthesia Plan ? ?ASA: 3 ? ?Anesthesia Plan: MAC  ? ?Post-op Pain Management: Tylenol PO (pre-op)*  ? ?Induction: Intravenous ? ?PONV Risk Score and Plan: 2 and Ondansetron, Treatment may vary due to age or medical condition and Propofol infusion ? ?Airway Management Planned: Simple Face Mask and Natural Airway ? ?Additional Equipment:  ? ?Intra-op Plan:  ? ?Post-operative Plan:  ? ?Informed Consent: I have reviewed the patients History and Physical, chart, labs and discussed the procedure including the risks, benefits and alternatives for the proposed anesthesia with the patient or authorized representative who has indicated his/her understanding and acceptance.  ? ?Patient has DNR.  ?Discussed  DNR with patient and Suspend DNR. ?  ?Dental advisory given ? ?Plan Discussed with: CRNA ? ?Anesthesia Plan Comments:   ? ? ? ? ? ? ?Anesthesia Quick Evaluation ? ?

## 2021-11-17 NOTE — Op Note (Signed)
11/17/2021 ? ?8:03 AM ? ?PATIENT:  [name]  88 y.o. female ? ?PRE-OPERATIVE DIAGNOSIS:  RIGHT SECOND TOE OSTEOMYELITIS ? ?POST-OPERATIVE DIAGNOSIS:  RIGHT SECOND TOE OSTEOMYELITIS ? ?Procedure(s): ?RIGHT SECOND TOE AMPUTATION ? ?SURGEON:  Wylene Simmer, MD ? ?ASSISTANT: none ? ?ANESTHESIA:   local, mAC ? ?EBL:  minimal  ? ?TOURNIQUET:   ?Total Tourniquet Time Documented: ?Calf (Right) - 9 minutes ?Total: Calf (Right) - 9 minutes ? ?COMPLICATIONS:  None apparent ? ?DISPOSITION:  Extubated, awake and stable to recovery. ? ?INDICATION FOR PROCEDURE:  88 y/o female with a complex past medical history has an ulcer on the dorsum of the right foot second toe for at least the last month.  She has developed osteomyelitis and presents now for second toe amputation.  She and her daughter understand the risks and benefits of the alternative treatment options and elect surgical treatment.  They specifically understand risks of bleeding, infection, nerve damage, blood clots, need for additional surgery, revision amputation and death. ? ?PROCEDURE IN DETAIL:  After pre operative consent was obtained, and the correct operative site was identified, the patient was brought to the operating room and placed supine on the OR table.  Anesthesia was administered.  Pre-operative antibiotics were administered.  A surgical timeout was taken.  Local anesthetic was infiltrated as a metatarsal block for the right foot second toe.  The right foot was then prepped and draped in standard sterile fashion.  The foot was exsanguinated and a 4 inch Esmarch tourniquet wrapped around the ankle.  A racquet incision was marked on the skin around the base of the toe.  The incision was then made and dissection carried sharply down through the subcutaneous tissues to the level of the bone of the proximal phalanx.  Subperiosteal dissection was then carried proximally to the MTP joint.  The toe was disarticulated through the MTP joint and passed off the  field.  The neurovascular bundles were cauterized.  The wound was irrigated copiously.  Horizontal mattress sutures of 3-0 nylon were used to close the skin incision.  Sterile dressings were applied followed by a padded compression dressing.  The dressing was extended proximally to include the superficial wound at the leg.  The tourniquet was released after application of the dressings.  The patient was awakened from anesthesia and transported to the recovery room in stable condition. ? ? ?FOLLOW UP PLAN: Weightbearing as tolerated in a flat postop shoe.  Follow-up in the office in 3 weeks for a wound check and suture removal.  DVT prophylaxis with Coumadin which she takes at baseline.  Okay to resume anticoagulation on postop day 1. ? ? ?

## 2021-11-17 NOTE — Progress Notes (Signed)
Pharmacy Antibiotic Note ? ?[name] is a 88 y.o. female admitted on 11/15/2021 with leg pain and a possible allergic reaction to lower extremities after taking an antibiotic. She was prescribed nitrofurantoin for UTI, was also using topical cream/ointment for wound. Pharmacy has been consulted for vancomycin and cefepime dosing for osteomyelitis. ? ?Today,11/17/21 ?Patient is afebrile, WBC WNL, and serum creatinine up to 1.94 ?Last dose of vancomycin was 2 g IV on 3/11 at 0014 ? ?Plan: ?Change vancomycin to 750 mg IV every 48 hours ?Continue Cefepime 2 g IV q24h ?Continue to follow cultures, labs and clinical progress for necessary antibiotic dose adjustments and/or appropriate de-escalation ? ? ?Height: 5\' 2"  (157.5 cm) ?Weight: 88.4 kg (194 lb 14.2 oz) ?IBW/kg (Calculated) : 50.1 ? ?Temp (24hrs), Avg:97.7 ?F (36.5 ?C), Min:97.4 ?F (36.3 ?C), Max:98.1 ?F (36.7 ?C) ? ?Recent Labs  ?Lab 11/15/21 ?1743 11/15/21 ?1744 11/15/21 ?2337 11/16/21 ?01/16/22 11/17/21 ?0602  ?WBC 12.4*  --   --  10.4 6.9  ?CREATININE 1.29*  --   --  1.30* 1.94*  ?LATICACIDVEN  --  2.3* 1.7  --   --   ? ?  ?Estimated Creatinine Clearance: 19.5 mL/min (A) (by C-G formula based on SCr of 1.94 mg/dL (H)).   ? ?Allergies  ?Allergen Reactions  ? Acetaminophen   ? Amoxicillin   ? Azithromycin   ?  Other reaction(s): unsure  ? Codeine   ?  Other reaction(s): unsure  ? Erythromycin   ? Erythromycin Ethylsuccinate   ? Green Dyes Other (See Comments)  ?  Allergic to ALL dyes  ? Iodine   ? Misc. Sulfonamide Containing Compounds   ? Oxycodone   ? Oxycodone-Acetaminophen   ? Penicillins   ? Sulfasalazine   ? Tramadol Hives  ? ? ?Antimicrobials this admission: ?Vancomcyin 3/10 >> ?Cefepime 3/10 >> ? ?Dose adjustments this admission:  ?3/12: change vancomycin from 1 g IV q48h to 750 mg IV q48h ? ?Microbiology results: ?3/10 BCx: GPC aerobic, 1/4, staph epi, no Resist ? ? ?Thank you for allowing pharmacy to be a part of this patient?s care. ? ?5/10,  PharmD, BCPS ?Clinical Pharmacist ?11/17/2021 1:57 PM ? ? ?

## 2021-11-18 ENCOUNTER — Encounter (HOSPITAL_COMMUNITY): Payer: Self-pay | Admitting: Orthopedic Surgery

## 2021-11-18 DIAGNOSIS — I5032 Chronic diastolic (congestive) heart failure: Secondary | ICD-10-CM | POA: Diagnosis not present

## 2021-11-18 DIAGNOSIS — L03115 Cellulitis of right lower limb: Secondary | ICD-10-CM | POA: Diagnosis not present

## 2021-11-18 DIAGNOSIS — N179 Acute kidney failure, unspecified: Secondary | ICD-10-CM | POA: Diagnosis not present

## 2021-11-18 DIAGNOSIS — M869 Osteomyelitis, unspecified: Secondary | ICD-10-CM | POA: Diagnosis not present

## 2021-11-18 LAB — CBC WITH DIFFERENTIAL/PLATELET
Abs Immature Granulocytes: 0.02 10*3/uL (ref 0.00–0.07)
Basophils Absolute: 0 10*3/uL (ref 0.0–0.1)
Basophils Relative: 1 %
Eosinophils Absolute: 0.8 10*3/uL — ABNORMAL HIGH (ref 0.0–0.5)
Eosinophils Relative: 13 %
HCT: 33.2 % — ABNORMAL LOW (ref 36.0–46.0)
Hemoglobin: 10.7 g/dL — ABNORMAL LOW (ref 12.0–15.0)
Immature Granulocytes: 0 %
Lymphocytes Relative: 11 %
Lymphs Abs: 0.7 10*3/uL (ref 0.7–4.0)
MCH: 30 pg (ref 26.0–34.0)
MCHC: 32.2 g/dL (ref 30.0–36.0)
MCV: 93 fL (ref 80.0–100.0)
Monocytes Absolute: 0.7 10*3/uL (ref 0.1–1.0)
Monocytes Relative: 10 %
Neutro Abs: 4.4 10*3/uL (ref 1.7–7.7)
Neutrophils Relative %: 65 %
Platelets: 145 10*3/uL — ABNORMAL LOW (ref 150–400)
RBC: 3.57 MIL/uL — ABNORMAL LOW (ref 3.87–5.11)
RDW: 14.6 % (ref 11.5–15.5)
WBC: 6.7 10*3/uL (ref 4.0–10.5)
nRBC: 0 % (ref 0.0–0.2)

## 2021-11-18 LAB — CULTURE, BLOOD (ROUTINE X 2): Special Requests: ADEQUATE

## 2021-11-18 LAB — BASIC METABOLIC PANEL
Anion gap: 8 (ref 5–15)
BUN: 32 mg/dL — ABNORMAL HIGH (ref 8–23)
CO2: 21 mmol/L — ABNORMAL LOW (ref 22–32)
Calcium: 8.6 mg/dL — ABNORMAL LOW (ref 8.9–10.3)
Chloride: 96 mmol/L — ABNORMAL LOW (ref 98–111)
Creatinine, Ser: 1.52 mg/dL — ABNORMAL HIGH (ref 0.44–1.00)
GFR, Estimated: 32 mL/min — ABNORMAL LOW (ref >60)
Glucose, Bld: 98 mg/dL (ref 70–99)
Potassium: 4.4 mmol/L (ref 3.5–5.1)
Sodium: 125 mmol/L — ABNORMAL LOW (ref 135–145)

## 2021-11-18 LAB — PROTIME-INR
INR: 2.3 — ABNORMAL HIGH (ref 0.8–1.2)
Prothrombin Time: 25.1 seconds — ABNORMAL HIGH (ref 11.4–15.2)

## 2021-11-18 LAB — MAGNESIUM: Magnesium: 2.1 mg/dL (ref 1.7–2.4)

## 2021-11-18 MED ORDER — WARFARIN - PHARMACIST DOSING INPATIENT: Freq: Every day | Status: DC

## 2021-11-18 MED ORDER — VANCOMYCIN HCL IN DEXTROSE 1-5 GM/200ML-% IV SOLN: 1000.0000 mg | INTRAVENOUS | Status: DC

## 2021-11-18 MED ORDER — SODIUM CHLORIDE 1 G PO TABS
1.0000 g | ORAL_TABLET | Freq: Three times a day (TID) | ORAL | Status: DC
  Administered 2021-11-18 – 2021-11-19 (×5): 1 g via ORAL
  Filled 2021-11-18 (×6): qty 1

## 2021-11-18 MED ORDER — METOPROLOL SUCCINATE ER 50 MG PO TB24
50.0000 mg | ORAL_TABLET | Freq: Every day | ORAL | Status: DC
  Administered 2021-11-18 – 2021-11-21 (×4): 50 mg via ORAL
  Filled 2021-11-18 (×4): qty 1

## 2021-11-18 MED ORDER — WARFARIN 0.5 MG HALF TABLET
0.5000 mg | ORAL_TABLET | Freq: Once | ORAL | Status: AC
  Administered 2021-11-18: 0.5 mg via ORAL
  Filled 2021-11-18: qty 1

## 2021-11-18 NOTE — Progress Notes (Signed)
?Progress Note ? ? ?Patient: [name] [id number] DOB: 1927-11-26 DOA: 11/15/2021     2 ?DOS: the patient was seen and examined on 11/18/2021 ?  ?Brief hospital course: ?88 y.o. female with medical history significant of A-fib, CHF, GERD, hypertension, pulmonary embolism, thyroid disease, COPD, pulmonary hypertension, anxiety, hyperlipidemia, venous insufficiency presented with leg wound and pain and generalized weakness.  On presentation, right foot x-ray showed concern for possible osteomyelitis and recommended MRI.  Patient was started on broad-spectrum antibiotics.  Orthopedics was consulted ? ?Assessment and Plan: ?* Osteomyelitis of second toe of right foot ([place]) ?Lower extremity cellulitis ?Right lower shin ulcer ?-Currently on broad-spectrum antibiotics. ?-Orthopedics following.  Status post right second toe amputation on 11/25/2021.  Wound care as per orthopedics recommendations. ? ? ?Chronic diastolic CHF (congestive heart failure) ([place]) ?Hypertension ?Chronic pulmonary hypertension ?- Currently compensated.  Strict input and output.  Daily weights.  Last echo in 2019 in [place] showed EF of greater than 70% with severe pulmonary hypertension and severe tricuspid regurgitation ?-Strict input and output.  Daily weights.  Fluid restriction.  Continue metoprolol.  Held losartan, Bumex, spironolactone on 11/17/2021 because of acute kidney injury.  Outpatient follow-up with PCP/cardiology ? ?Staph epidermidis bacteremia ?-Only in 1 bottle.  Possible contaminant. ? ?Permanent atrial fibrillation ([place]) ?-Coumadin on hold.  Resume Coumadin today as orthopedics has given clearance.  Continue metoprolol.  Outpatient follow-up with cardiology. ? ?Acute kidney injury on CKD (chronic kidney disease), stage III ([place]) ?CKD IIIb ?-Creatinine has improved to 1.53 today. diuretics and losartan on hold.  DC IV fluids if oral intake is improved.  Repeat a.m. labs. ? ?Hyponatremia ?-Worsening.  Sodium 125 today.   Repeat a.m. sodium. ? ?Hypothyroidism ?- Continue Synthroid ? ?Gastroesophageal reflux disease ?- Continue PPI ? ?Leukocytosis ?-resolved ? ?Anemia of chronic disease ?-Hemoglobin stable ? ?Thrombocytopenia ?-Mild.  No signs of bleeding.  Monitor intermittently. ? ? ? ?  ? ?Subjective:  ?Patient seen and examined at bedside.  Poor historian.  Hard of hearing.  No fever, vomiting, agitation reported.  Wants to go home today. ?Physical Exam: ?Vitals:  ? 11/17/21 1357 11/17/21 2024 11/18/21 0108 11/18/21 0527  ?BP: 132/73 (!) 150/63 (!) 167/97 127/73  ?Pulse: (!) 59 68 63 60  ?Resp: 15 14 14 14   ?Temp: 97.7 ?F (36.5 ?C) 98.1 ?F (36.7 ?C) 98.1 ?F (36.7 ?C) 97.6 ?F (36.4 ?C)  ?TempSrc: Oral Oral Oral Oral  ?SpO2: 99% 95% 96% 98%  ?Weight:    96.1 kg  ?Height:      ? ?General: No acute distress.  Currently on 3 L oxygen via nasal cannula.  Elderly female lying in bed.  Extremely hard of hearing.   ?ENT/neck: No JVD elevation.  No palpable thyromegaly ?respiratory: Bilateral decreased breath sounds at bases with basilar crackles  ?CVS: Currently rate controlled; S1-S2 heard  ?abdominal: Soft, nontender, distended mildly no organomegaly, bowel sounds heard ?Extremities: Trace lower extremity edema present; no cyanosis  ?CNS: Alert; still very slow to respond.  Very poor historian.  No focal neurologic deficit.  Moving extremities  ?lymph: No palpable cervical lymphadenopathy skin: Right foot dressing present.  No obvious ecchymosis/lesions  ?psych: No signs of agitation.  Flat affect.   ?Musculoskeletal: No obvious other joint tenderness/swelling ? ?Data Reviewed: ?I have reviewed patient's investigation including blood work and imaging done during this hospitalization myself.  Today sodium is 125, creatinine 1.52, WBC 6.7, hemoglobin 10.7, platelets 145, INR 2.3 ?Family Communication: spoke to daughter and granddaughter  at bedside on 11/17/2021 ? ?Disposition: ?Status is: Inpatient ?Remains inpatient appropriate because: Of  severity of illness.  Need for IV antibiotics ? Planned Discharge Destination: Home ? ? ? ?Author: ?Aline August, MD ?11/18/2021 7:40 AM ? ?For on call review [email or web address]  ?

## 2021-11-18 NOTE — Progress Notes (Signed)
ANTICOAGULATION CONSULT NOTE  ? ?Pharmacy Consult for warfarin ?Indication: hx atrial fibrillation and pulmonary embolus ? ?Allergies  ?Allergen Reactions  ? Acetaminophen   ? Amoxicillin   ? Azithromycin   ?  Other reaction(s): unsure  ? Codeine   ?  Other reaction(s): unsure  ? Erythromycin   ? Erythromycin Ethylsuccinate   ? Green Dyes Other (See Comments)  ?  Allergic to ALL dyes  ? Iodine   ? Misc. Sulfonamide Containing Compounds   ? Oxycodone   ? Oxycodone-Acetaminophen   ? Penicillins   ? Sulfasalazine   ? Tramadol Hives  ? ? ?Patient Measurements: ?Height: 5\' 2"  (157.5 cm) ?Weight: 96.1 kg (211 lb 13.8 oz) ?IBW/kg (Calculated) : 50.1 ?Heparin Dosing Weight:  ? ?Vital Signs: ?Temp: 97.6 ?F (36.4 ?C) (03/13 [id number]) ?Temp Source: Oral (03/13 0527) ?BP: 127/73 (03/13 0527) ?Pulse Rate: 60 (03/13 0527) ? ?Labs: ?Recent Labs  ?  11/16/21 ?0709 11/17/21 ?0602 11/18/21 ?0427  ?HGB 10.5* 10.8* 10.7*  ?HCT 33.1* 34.4* 33.2*  ?PLT 144* 135* 145*  ?LABPROT 29.6* 29.2* 25.1*  ?INR 2.8* 2.8* 2.3*  ?CREATININE 1.30* 1.94* 1.52*  ? ? ?Estimated Creatinine Clearance: 26.1 mL/min (A) (by C-G formula based on SCr of 1.52 mg/dL (H)). ? ? ?Medications:  ?- PTA warfarin regimen: 2mg  daily (last dose taken on 3/9) ? ?Assessment: ?Patient is a 88 y.o F with hx PE and afib on warfarin PTA, presented to the ED on 11/15/2021 with c/o leg pain, right second toe ulcer and generalized weakness. Right foot MRI on 11/15/21 showed findings that's suspicious of right second toe osteomyelitis and she subsequently underwent right second toe amputation on 11/17/21.  Pharmacy has been consulted to resume warfarin back on 11/18/21. ? ?Today, 11/18/2021: ?- INR is therapeutic at 2.3 with last dose of warfarin taken PTA on 3/9 ?- cbc somewhat stable ?- no bleeding documented ?- Drug-drug intxns: being on abx can make pt more sensitive to warfarin. She's currently on vancomycin, cefepime and flagyl ? ?Goal of Therapy:  ?INR 2-3 ?Monitor platelets by  anticoagulation protocol: Yes ?  ?Plan:  ?- warfarin 0.5 mg PO x1 today ?- daily INR ?- monitor for s/sx bleeding ? ?[name] P ?11/18/2021,8:45 AM ? ? ?

## 2021-11-18 NOTE — Progress Notes (Signed)
Pharmacy Antibiotic Note ? ?[name] is a 88 y.o. female with presented to the ED on 11/15/2021 with c/o leg pain, right second toe ulcer and generalized weakness. Right foot MRI on 11/15/21 showed findings that's suspicious of right second toe osteomyelitis.  She underwent right second toe amputation on 11/17/21.  She's currently vancomycin, cefepime and flagyl for infection. ? ?Today, 11/18/2021: ?- day #3 abx ?- afib, wbc wnl ?- scr down 1.52 with hydration, crcl ~26 (hx CKD, baseline scr 1.4?) ?- 1 of 4 blood cx bottles with staph epi ? ? ?Plan: ?- adjust vancomycin to 1000 mg IV q48h for est AUC 515 ?- continue cefepime 2gm q24h and flagyl 500 mg q12h ?- monitor renal function closely ? ?______________________________________ ? ?Height: 5\' 2"  (157.5 cm) ?Weight: 96.1 kg (211 lb 13.8 oz) ?IBW/kg (Calculated) : 50.1 ? ?Temp (24hrs), Avg:97.8 ?F (36.6 ?C), Min:97.4 ?F (36.3 ?C), Max:98.1 ?F (36.7 ?C) ? ?Recent Labs  ?Lab 11/15/21 ?1743 11/15/21 ?1744 11/15/21 ?2337 11/16/21 ?01/16/22 11/17/21 ?0602 11/18/21 ?0427  ?WBC 12.4*  --   --  10.4 6.9 6.7  ?CREATININE 1.29*  --   --  1.30* 1.94* 1.52*  ?LATICACIDVEN  --  2.3* 1.7  --   --   --   ?  ?Estimated Creatinine Clearance: 26.1 mL/min (A) (by C-G formula based on SCr of 1.52 mg/dL (H)).   ? ?Allergies  ?Allergen Reactions  ? Acetaminophen   ? Amoxicillin   ? Azithromycin   ?  Other reaction(s): unsure  ? Codeine   ?  Other reaction(s): unsure  ? Erythromycin   ? Erythromycin Ethylsuccinate   ? Green Dyes Other (See Comments)  ?  Allergic to ALL dyes  ? Iodine   ? Misc. Sulfonamide Containing Compounds   ? Oxycodone   ? Oxycodone-Acetaminophen   ? Penicillins   ? Sulfasalazine   ? Tramadol Hives  ? ?3/10 Cefepime>> ?3/11 Flagyl>> ?3/11 vancomycin>> ?  ?3/10 BCx: 1/4 staph epi  ? ?Thank you for allowing pharmacy to be a part of this patientMegans care. ? ?5/10 ?11/18/2021 8:32 AM ? ?

## 2021-11-18 NOTE — Evaluation (Signed)
Occupational Therapy Evaluation ?Patient Details ?Name: [name] ?MRN: [id number] ?DOB: 04/07/1928 ?Today's Date: 11/18/2021 ? ? ?History of Present Illness 88 yo female admitted with osteomyelitis. S/P R 2nd toe amputation 11/17/21.  ? ?Clinical Impression ?  ?This 88 yo female admitted and underwent above presents to acute OT with PLOF of being Mod I from RW level for basic ADLs. Currently she is setup/Mod A for basic ADLs. I spoke to dtr over the phone and she reports she can be with pt anytime she is up on her feet (as long as her mother lets her know she is getting up) and can A her with LB ADLs as well. Dtr is aware pt needs to have post op shoe on anytime she is up on her feet and have other shoe on as well to even her out.We will continue to follow pt.  ?   ? ?Recommendations for follow up therapy are one component of a multi-disciplinary discharge planning process, led by the attending physician.  Recommendations may be updated based on patient status, additional functional criteria and insurance authorization.  ? ?Follow Up Recommendations ? Home health OT  ?  ?Assistance Recommended at Discharge Frequent or constant Supervision/Assistance  ?Patient can return home with the following A little help with walking and/or transfers;A little help with bathing/dressing/bathroom;Help with stairs or ramp for entrance;Assist for transportation;Assistance with cooking/housework;Direct supervision/assist for financial management;Direct supervision/assist for medications management ? ?  ?Functional Status Assessment ? Patient has had a recent decline in their functional status and demonstrates the ability to make significant improvements in function in a reasonable and predictable amount of time.  ?Equipment Recommendations ? None recommended by OT  ?  ?   ?Precautions / Restrictions Precautions ?Precautions: Fall ?Precaution Comments: post op shoe on R, regular shoe on L foot ?Restrictions ?Weight Bearing  Restrictions: No ?Other Position/Activity Restrictions: WBAT with post op shoe and RW  ? ?  ? ?Mobility Bed Mobility ?Overal bed mobility: Needs Assistance ?Bed Mobility: Supine to Sit ?  ?  ?Supine to sit: Min guard, HOB elevated ?  ?  ?  ?  ? ?Transfers ?Overall transfer level: Needs assistance ?Equipment used: Rolling walker (2 wheels) ?Transfers: Sit to/from Stand ?Sit to Stand: Min assist, From elevated surface ?Stand pivot transfers: Min assist ?  ?  ?  ?  ?General transfer comment: Assist to power up, stabilize, control descent. Cues for safey, hand/feet placement. Pt prefer to brace forearm on handle and hold to front of walker to stand up. ?  ? ?  ?Balance Overall balance assessment: Needs assistance ?Sitting-balance support: No upper extremity supported, Feet supported ?Sitting balance-Leahy Scale: Good ?  ?  ?Standing balance support: Bilateral upper extremity supported, Reliant on assistive device for balance ?Standing balance-Leahy Scale: Poor ?  ?  ?  ?  ?  ?  ?  ?  ?  ?  ?  ?  ?   ? ?ADL either performed or assessed with clinical judgement  ? ?ADL Overall ADL's : Needs assistance/impaired ?Eating/Feeding: Independent;Sitting ?  ?Grooming: Set up;Supervision/safety;Sitting ?  ?Upper Body Bathing: Set up;Supervision/ safety;Sitting ?  ?Lower Body Bathing: Moderate assistance ?Lower Body Bathing Details (indicate cue type and reason): min A sit<>stand ?Upper Body Dressing : Set up;Supervision/safety;Sitting ?  ?Lower Body Dressing: Moderate assistance ?Lower Body Dressing Details (indicate cue type and reason): min A sit<>stand ?Toilet Transfer: Minimal assistance;Stand-pivot;Rolling walker (2 wheels) ?Toilet Transfer Details (indicate cue type and reason): bed>recliner ?Toileting- Water quality scientist  and Hygiene: Minimal assistance;Sit to/from stand ?  ?  ?  ?  ?   ? ? ? ?Vision Patient Visual Report: No change from baseline ?   ?   ?   ?   ? ?Pertinent Vitals/Pain Pain Assessment ?Pain Assessment:  Faces ?Faces Pain Scale: Hurts a little bit ?Pain Location: tip of big toe on right ?Pain Descriptors / Indicators: Aching, Sore ?Pain Intervention(s): Limited activity within patient's tolerance, Monitored during session, Repositioned  ? ? ? ?Hand Dominance Right ?  ?Extremity/Trunk Assessment Upper Extremity Assessment ?Upper Extremity Assessment: Generalized weakness ?  ?  ?  ?  ?  ?Communication Communication ?Communication: No difficulties ?  ?Cognition Arousal/Alertness: Awake/alert ?Behavior During Therapy: [employer] for tasks assessed/performed ?Overall Cognitive Status: Impaired/Different from baseline ?Area of Impairment: Safety/judgement ?  ?  ?  ?  ?  ?  ?  ?  ?  ?  ?  ?  ?Safety/Judgement: Decreased awareness of safety ?  ?  ?General Comments: kept repeating herself ?  ?  ?General Comments  O2 on RA 94% (had O2 on in room, but not in nose when I entered) ? ?  ?   ?   ? ? ?Home Living Family/patient expects to be discharged to:: Private residence ?Living Arrangements: Children ?Available Help at Discharge: Family;Available 24 hours/day ?Type of Home: House ?Home Access:  (incline) ?  ?  ?Home Layout: One level ?  ?  ?Bathroom Shower/Tub: Sponge bathes at baseline ?  ?Bathroom Toilet: Standard ?  ?  ?Home Equipment: Rolling Walker (2 wheels);BSC/3in1, tub bench ?  ?  ?  ? ?  ?Prior Functioning/Environment Prior Level of Function : Independent/Modified Independent ?  ?  ?  ?  ?  ?  ?Mobility Comments: from a RW level ?ADLs Comments: from a RW level ?  ? ?  ?  ?OT Problem List: Decreased strength;Impaired balance (sitting and/or standing);Decreased cognition;Decreased safety awareness;Pain ?  ?   ?OT Treatment/Interventions: Self-care/ADL training;DME and/or AE instruction;Patient/family education;Balance training  ?  ?OT Goals(Current goals can be found in the care plan section) Acute Rehab OT Goals ?Patient Stated Goal: to go home and feel better ?OT Goal Formulation: With patient/family ?Time For Goal  Achievement: 12/02/21 ?Potential to Achieve Goals: Good  ?OT Frequency: Min 2X/week ?  ? ?   ?AM-PAC OT "6 Clicks" Daily Activity     ?Outcome Measure Help from another person eating meals?: None ?Help from another person taking care of personal grooming?: A Little ?Help from another person toileting, which includes using toliet, bedpan, or urinal?: A Little ?Help from another person bathing (including washing, rinsing, drying)?: A Lot ?Help from another person to put on and taking off regular upper body clothing?: A Little ?Help from another person to put on and taking off regular lower body clothing?: A Lot ?6 Click Score: 17 ?  ?End of Session Equipment Utilized During Treatment: Rolling walker (2 wheels) ?Nurse Communication:  (RN in room when I got pt up from bed and into recliner) ? ?Activity Tolerance: Patient limited by fatigue ?Patient left: in chair;with call bell/phone within reach;with chair alarm set ? ?OT Visit Diagnosis: Unsteadiness on feet (R26.81);Other abnormalities of gait and mobility (R26.89);Muscle weakness (generalized) (M62.81);Pain ?Pain - Right/Left: Right ?Pain - part of body:  (tip of great toe)  ?              ?Time: [id number] ?OT Time Calculation (min): 31 min ?Charges:  OT General Charges ?$OT Visit:  1 Visit ?OT Evaluation ?$OT Eval Moderate Complexity: 1 Mod ?OT Treatments ?$Self Care/Home Management : 8-22 mins ? ?[place], OTR/L ?Acute Rehab Services ?Pager [phone] ?Office [phone] ? ? ? ?Almon Register ?11/18/2021, 10:46 AM ?

## 2021-11-18 NOTE — Progress Notes (Signed)
Subjective: ?1 Day Post-Op Procedure(s) (LRB): ?RIGHT SECOND TOE AMPUTATION (Right) ? ?Patient reports pain as mild.  Resting comfortably in bed.  Denies fever, chills, CP.  Tolerating POs well.   ? ?Objective:  ? ?VITALS:  Temp:  [97.4 ?F (36.3 ?C)-98.1 ?F (36.7 ?C)] 97.6 ?F (36.4 ?C) (03/13 [id number]) ?Pulse Rate:  [47-70] 60 (03/13 0527) ?Resp:  [7-26] 14 (03/13 0527) ?BP: (127-193)/(63-97) 127/73 (03/13 0527) ?SpO2:  [91 %-99 %] 98 % (03/13 0527) ?Weight:  [96.1 kg] 96.1 kg (03/13 0527) ? ?General: WDWN patient in NAD. ?Psych:  Appropriate mood and affect. ?Neuro:  A&O x 3, Moving all extremities, sensation intact to light touch ?HEENT:  EOMs intact ?Chest:  Even non-labored respirations ?Skin:  Dressing D/I with scant amount of dry blood at toes, no rashes or lesions ?Extremities: warm/dry, no visible edema, erythema or echymosis.  No lymphadenopathy. ?Pulses: Popliteus 2+ ?MSK:  ROM: EHL/FHL intact, MMT: able to perform quad set ? ? ?LABS ?Recent Labs  ?  11/15/21 ?1743 11/16/21 ?0709 11/17/21 ?0602 11/18/21 ?0427  ?HGB 11.7* 10.5* 10.8* 10.7*  ?WBC 12.4* 10.4 6.9 6.7  ?PLT 174 144* 135* 145*  ? ?Recent Labs  ?  11/17/21 ?0602 11/18/21 ?0427  ?NA 126* 125*  ?K 4.4 4.4  ?CL 95* 96*  ?CO2 21* 21*  ?BUN 32* 32*  ?CREATININE 1.94* 1.52*  ?GLUCOSE 104* 98  ? ?Recent Labs  ?  11/17/21 ?0602 11/18/21 ?0427  ?INR 2.8* 2.3*  ? ? ? ?Assessment/Plan: ?1 Day Post-Op Procedure(s) (LRB): ?RIGHT SECOND TOE AMPUTATION (Right) ? ?WBAT R LE in flat post-op shoe. ?DVT ppx:  May resume Coumadin today. ?Reinforce dressing prn.  ?Plan for 3 week outpatient post-op visit. ? ?Mechele Claude PA-C ?[facility] ?Office:  [phone]  ?

## 2021-11-18 NOTE — TOC Initial Note (Signed)
Transition of Care (TOC) - Initial/Assessment Note  ? ? ?Patient Details  ?Name: [name] ?MRN: [id number] ?Date of Birth: [birth year] ? ?Transition of Care [facility]) CM/SW Contact:    ?Dessa Phi, RN ?Phone Number: ?11/18/2021, 1:14 PM ? ?Clinical Narrative: Active [facility]/PT/OT-rep Stacie aware.                 ? ? ?Expected Discharge Plan: [place] ?Barriers to Discharge: Continued Medical Work up ? ? ?Patient Goals and CMS Choice ?Patient states their goals for this hospitalization and ongoing recovery are:: Home ?CMS Medicare.gov Compare Post Acute Care list provided to:: Patient Represenative (must comment) [name] dtr [phone]) ?Choice offered to / list presented to : Adult Children ? ?Expected Discharge Plan and Services ?Expected Discharge Plan: [place] ?  ?Discharge Planning Services: CM Consult ?Post Acute Care Choice: Home Health ?Living arrangements for the past 2 months: [place] ?                ?  ?  ?  ?  ?  ?HH Arranged: RN, PT, OT ?[place] Agency: [place] ?Date HH Agency Contacted: 11/18/21 ?Time [place]: [id number] spoke with at [place]: [place] ? ?Prior Living Arrangements/Services ?Living arrangements for the past 2 months: [place] ?Lives with:: Adult Children ?Patient language and need for interpreter reviewed:: Yes ?Do you feel safe going back to the place where you live?: Yes      ?Need for Family Participation in Patient Care: Yes (Comment) ?Care giver support system in place?: Yes (comment) ?Current home services: Home PT, Home RN, Home OT [facility]) ?Criminal Activity/Legal Involvement Pertinent to Current Situation/Hospitalization: No - Comment as needed ? ?Activities of Daily Living ?Home Assistive Devices/Equipment: Gilford Rile (specify type), Eyeglasses, Raised toilet seat with rails (grabber, lift chair) ?ADL Screening (condition at time of admission) ?Patient's cognitive  ability adequate to safely complete daily activities?: Yes ?Is the patient deaf or have difficulty hearing?: No ?Does the patient have difficulty seeing, even when wearing glasses/contacts?: No ?Does the patient have difficulty concentrating, remembering, or making decisions?: Yes (forgetting some things) ?Patient able to express need for assistance with ADLs?: Yes ?Does the patient have difficulty dressing or bathing?: No ?Independently performs ADLs?: Yes (appropriate for developmental age) (independent with a walker) ?Does the patient have difficulty walking or climbing stairs?: Yes ?Weakness of Legs: None ?Weakness of Arms/Hands: None ? ?Permission Sought/Granted ?Permission sought to share information with : Case Manager ?Permission granted to share information with : Yes, Verbal Permission Granted ? Share Information with NAME: Case manager ?   ?   ?   ? ?Emotional Assessment ?Appearance:: Appears stated age ?Attitude/Demeanor/Rapport: Gracious ?Affect (typically observed): Accepting ?Orientation: : Oriented to Self, Oriented to Place, Oriented to  Time, Oriented to Situation ?Alcohol / Substance Use: Not Applicable ?Psych Involvement: No (comment) ? ?Admission diagnosis:  Osteomyelitis of other site, unspecified type ([place]) [M86.9] ?Osteomyelitis of second toe of right foot ([place]) [M86.9] ?Sepsis, due to unspecified organism, unspecified whether acute organ dysfunction present ([place]) [A41.9] ?Osteomyelitis ([place]) [M86.9] ?Patient Active Problem List  ? Diagnosis Date Noted  ? Thrombocytopenia ([place]) 11/17/2021  ? Anemia of chronic disease 11/17/2021  ? AKI (acute kidney injury) ([place]) 11/17/2021  ? Osteomyelitis ([place]) 11/16/2021  ? Chronic diastolic CHF (congestive heart failure) ([place]) 11/16/2021  ? Hyponatremia 11/16/2021  ? CKD (chronic kidney disease), stage III ([place]) 11/16/2021  ? Leukocytosis 11/16/2021  ?  Essential hypertension 11/15/2021  ? Hyperthyroidism 11/15/2021  ? Peripheral venous insufficiency  11/15/2021  ? Personal history of pulmonary embolism 11/15/2021  ? Osteomyelitis of second toe of right foot ([place]) 11/15/2021  ? Cellulitis 11/15/2021  ? Chronic thromboembolic pulmonary hypertension ([place]) 05/12/2014  ? Permanent atrial fibrillation ([place]) 06/20/2009  ? Chronic obstructive lung disease ([place]) 04/04/2009  ? Gastroesophageal reflux disease 12/25/2008  ? Generalized anxiety disorder 12/25/2008  ? Hypercholesterolemia 12/25/2008  ? Hypothyroidism 12/25/2008  ? ?PCP:  [name], MD ?Pharmacy:   ?[id number] - [place], [place] [address] ?[place] ?[place] [address] ?Phone: [phone] Fax: [phone] ? ? ? ? ?Social Determinants of Health (SDOH) Interventions ?  ? ?Readmission Risk Interventions ?Readmission Risk Prevention Plan 11/18/2021  ?Transportation Screening Complete  ?PCP or Specialist Appt within 3-5 Days Complete  ?[place] or Home Care Consult Complete  ?Social Work Consult for [place] Planning/Counseling Complete  ?Palliative Care Screening Complete  ?Medication Review Press photographer) Complete  ?Some recent data might be hidden  ? ? ? ?

## 2021-11-18 NOTE — Discharge Instructions (Signed)
Megan Simmer, MD ?[name] ? ?Please read the following information regarding your care after surgery. ? ?Medications  ?You only need a prescription for the narcotic pain medicine (ex. oxycodone, Percocet, Norco).  All of the other medicines listed below are available over the counter. ?? acetominophen (Tylenol) 650 mg every 4-6 hours as you need for minor to moderate pain ? ? ?? To help prevent blood clots, resume Coumadin after surgery.  You should also get up every hour while you are awake to move around.   ? ?Weight Bearing ?? Bear weight only on your operated foot in the post-op shoe. ? ? ?Cast / Splint / Dressing ?? Keep your splint, cast or dressing clean and dry.  Don?t put anything (coat hanger, pencil, etc) down inside of it.  If it gets damp, use a hair dryer on the cool setting to dry it.  If it gets soaked, call the office to schedule an appointment for a cast change. ? ? ?After your dressing, cast or splint is removed; you may shower, but do not soak or scrub the wound.  Allow the water to run over it, and then gently pat it dry. ? ?Swelling ?It is normal for you to have swelling where you had surgery.  To reduce swelling and pain, keep your toes above your nose for at least 3 days after surgery.  It may be necessary to keep your foot or leg elevated for several weeks.  If it hurts, it should be elevated. ? ?Follow Up ?Call my office at [phone] when you are discharged from the hospital or surgery center to schedule an appointment to be seen three weeks after surgery. ? ?Call my office at [phone] if you develop a fever >101.5? F, nausea, vomiting, bleeding from the surgical site or severe pain.   ?  ?

## 2021-11-18 NOTE — Evaluation (Signed)
Clinical/Bedside Swallow Evaluation ?Patient Details  ?Name: [name] ?MRN: [id number] ?Date of Birth: 1927/11/19 ? ?Today's Date: 11/18/2021 ?Time: SLP Start Time (ACUTE ONLY): 1455 SLP Stop Time (ACUTE ONLY): 1510 ?SLP Time Calculation (min) (ACUTE ONLY): 15 min ? ?Past Medical History:  ?Past Medical History:  ?Diagnosis Date  ? Anticoagulant long-term use   ? Arrhythmia   ? Arthritis   ? Atrial fibrillation (HCC)   ? Congestive heart failure (CHF) (HCC)   ? Dysphagia   ? Esophageal reflux   ? Essential hypertension   ? Hiatal hernia   ? Pulmonary embolus (HCC)   ? Thyroid disease   ? ?Past Surgical History:  ?Past Surgical History:  ?Procedure Laterality Date  ? AMPUTATION TOE Right 11/17/2021  ? Procedure: RIGHT SECOND TOE AMPUTATION;  Surgeon: [name], MD;  Location: [facility];  Service: Orthopedics;  Laterality: Right;  ? COLONOSCOPY    ? ELBOW SURGERY    ? LAPAROSCOPIC HYSTERECTOMY    ? ?HPI:  ?88 yo female admitted with osteomyelitis. S/P R 2nd toe amputation 11/17/21. Clinical/bedside swallow evaluation ordered on 3/13 after nursing observed patient having issues when eating eggs at breakfast meal as well as some coughing when taking her PO medications.  ?  ?Assessment / Plan / Recommendation  ?Clinical Impression ? Patient currently not presenting with clinical s/s of oral or pharyngeal phase dysphagia but do suspect esophageal phase dysphagia secondary to instances of belching, patient endorsing globus sensation with solids (not during this test) and her daughter reporting patient with long h/o GERD. SLP observed patient consume straw sips of thin liquids (water) and a small container of gelatin. She did not exhibit any overt s/s aspiration or penetration but did exhibit mild belching consistent with her h/o GERD. Patient reported she has difficulty with foods that are too dry or crumbly and if she isnt able to chew it adequately just wont eat it. SLP suspects that patient's dysphagia is primarily  esophageal in nature and do not suspect she has had any acute changes in her swallow function. No further SLP intervention recommended at this time. ?SLP Visit Diagnosis: Dysphagia, unspecified (R13.10) ?   ?Aspiration Risk ? No limitations;Mild aspiration risk  ?  ?Diet Recommendation Regular;Thin liquid  ? ?Supervision: Patient able to self feed ?Compensations: Slow rate;Small sips/bites;Follow solids with liquid  ?  ?Other  Recommendations Oral Care Recommendations: Oral care BID   ? ?Recommendations for follow up therapy are one component of a multi-disciplinary discharge planning process, led by the attending physician.  Recommendations may be updated based on patient status, additional functional criteria and insurance authorization. ? ?Follow up Recommendations No SLP follow up  ? ? ?  ?Assistance Recommended at Discharge None  ?Functional Status Assessment Patient has had a recent decline in their functional status and demonstrates the ability to make significant improvements in function in a reasonable and predictable amount of time.  ?Frequency and Duration   N/A ?  ?  ?   ? ?Prognosis   N/A ? ?  ? ?Swallow Study   ?General Date of Onset: 11/18/21 ?HPI: 88 yo female admitted with osteomyelitis. S/P R 2nd toe amputation 11/17/21. Clinical/bedside swallow evaluation ordered on 3/13 after nursing observed patient having issues when eating eggs at breakfast meal as well as some coughing when taking her PO medications. ?Type of Study: Bedside Swallow Evaluation ?Previous Swallow Assessment: none found ?Diet Prior to this Study: Regular;Thin liquids ?Temperature Spikes Noted: No ?Respiratory Status: Nasal cannula ?History of  Recent Intubation: No ?Behavior/Cognition: Alert;Cooperative;Pleasant mood ?Oral Cavity Assessment: Within Functional Limits ?Oral Care Completed by SLP: No ?Oral Cavity - Dentition: Dentures, top;Dentures, bottom ?Vision: Functional for self-feeding ?Self-Feeding Abilities: Able to feed  self ?Patient Positioning: Upright in bed ?Baseline Vocal Quality: Normal ?Volitional Cough: Strong ?Volitional Swallow: Able to elicit  ?  ?Oral/Motor/Sensory Function Overall Oral Motor/Sensory Function: Within functional limits   ?Ice Chips     ?Thin Liquid Thin Liquid: Within functional limits ?Presentation: Straw;Self Fed  ?  ?Nectar Thick     ?Honey Thick     ?Puree Puree: Not tested   ?Solid ? ? ?  Presentation: Self Fed ?Other Comments: Patient consumed gelatin without any observed difficulties or s/s aspiration or penetration  ? ?  ? ?Angela Nevin, MA, CCC-SLP ?Speech Therapy ? ? ? ? ?

## 2021-11-18 NOTE — Progress Notes (Signed)
Physical Therapy Treatment ?Patient Details ?Name: [name] ?MRN: [id number] ?DOB: 14-Dec-1927 ?Today's Date: 11/18/2021 ? ? ?History of Present Illness 88 yo female admitted with osteomyelitis. S/P R 2nd toe amputation 11/17/21. ? ?  ?PT Comments  ? ? Pt took a few steps around the room with a RW. She is unsteady. O2 sats fluctuating during session. Pt is weak and fatigues easily. Will continue to follow and progress activity as tolerated.    ?Recommendations for follow up therapy are one component of a multi-disciplinary discharge planning process, led by the attending physician.  Recommendations may be updated based on patient status, additional functional criteria and insurance authorization. ? ?Follow Up Recommendations ? Home health PT ?  ?  ?Assistance Recommended at Discharge Frequent or constant Supervision/Assistance  ?Patient can return home with the following A little help with walking and/or transfers;A little help with bathing/dressing/bathroom;Assistance with cooking/housework;Assist for transportation;Help with stairs or ramp for entrance ?  ?Equipment Recommendations ? None recommended by PT  ?  ?Recommendations for Other Services   ? ? ?  ?Precautions / Restrictions Precautions ?Precautions: Fall ?Precaution Comments: post op shoe on R, regular shoe on L foot ?Restrictions ?Weight Bearing Restrictions: No ?Other Position/Activity Restrictions: WBAT with post op shoe and RW  ?  ? ?Mobility ? Bed Mobility ?  ?  ?  ?  ?  ?  ?  ?General bed mobility comments: oob in recliner ?  ? ?Transfers ?Overall transfer level: Needs assistance ?Equipment used: Rolling walker (2 wheels) ?Transfers: Sit to/from Stand ?Sit to Stand: Min assist ?  ?  ?  ?  ?  ?General transfer comment: Assist to power up, stabilize, control descent. Cues for safey, hand/feet placement. Pt prefer to brace forearm on handle and hold to front of walker to stand up. Sit to stand x 4 for safety, technique, strengthening. ?   ? ?Ambulation/Gait ?Ambulation/Gait assistance: Min assist, +2 safety/equipment ?Gait Distance (Feet): 5 Feet ?Assistive device: Rolling walker (2 wheels) ?Gait Pattern/deviations: Step-through pattern, Decreased stride length ?  ?  ?  ?General Gait Details: Assist to stabilzie pt while she walked ~ 5 feet to bsc then back to recliner. Cues for safety, posture. Unsteady. Fatigues eaily. Fluctuating O2 readings on pulse ox. ? ? ?Stairs ?  ?  ?  ?  ?  ? ? ?Wheelchair Mobility ?  ? ?Modified Rankin (Stroke Patients Only) ?  ? ? ?  ?Balance Overall balance assessment: Needs assistance ?  ?  ?  ?  ?Standing balance support: Bilateral upper extremity supported, Reliant on assistive device for balance ?Standing balance-Leahy Scale: Poor ?  ?  ?  ?  ?  ?  ?  ?  ?  ?  ?  ?  ?  ? ?  ?Cognition Arousal/Alertness: Awake/alert ?  ?Overall Cognitive Status: Impaired/Different from baseline ?Area of Impairment: Safety/judgement ?  ?  ?  ?  ?  ?  ?  ?  ?  ?  ?  ?  ?Safety/Judgement: Decreased awareness of safety ?  ?  ?General Comments: cues for safety throughout ?  ?  ? ?  ?Exercises   ? ?  ?General Comments General comments (skin integrity, edema, etc.): O2 on RA 94% (had O2 on in room, but not in nose when I entered) ?  ?  ? ?Pertinent Vitals/Pain Pain Assessment ?Pain Assessment: Faces ?Faces Pain Scale: Hurts a little bit ?Pain Location: tip of big toe on right, UE/back ?Pain Descriptors /  Indicators: Discomfort, Sore ?Pain Intervention(s): Limited activity within patient's tolerance, Monitored during session, Repositioned  ? ? ?Home Living Family/patient expects to be discharged to:: Private residence ?Living Arrangements: Children ?Available Help at Discharge: Family;Available 24 hours/day ?Type of Home: House ?Home Access:  (incline) ?  ?  ?  ?Home Layout: One level ?Home Equipment: Rolling Walker (2 wheels);BSC/3in1 ?   ?  ?Prior Function    ?  ?  ?   ? ?PT Goals (current goals can now be found in the care plan section)  Progress towards PT goals: Progressing toward goals (slowly) ? ?  ?Frequency ? ? ? Min 3X/week ? ? ? ?  ?PT Plan Current plan remains appropriate  ? ? ?Co-evaluation   ?  ?  ?  ?  ? ?  ?AM-PAC PT "6 Clicks" Mobility   ?Outcome Measure ? Help needed turning from your back to your side while in a flat bed without using bedrails?: A Little ?Help needed moving from lying on your back to sitting on the side of a flat bed without using bedrails?: A Little ?Help needed moving to and from a bed to a chair (including a wheelchair)?: A Little ?Help needed standing up from a chair using your arms (e.g., wheelchair or bedside chair)?: A Little ?Help needed to walk in hospital room?: A Lot ?Help needed climbing 3-5 steps with a railing? : A Lot ?6 Click Score: 16 ? ?  ?End of Session Equipment Utilized During Treatment: Gait belt ?Activity Tolerance: Patient limited by fatigue ?Patient left: in bed;with call bell/phone within reach;with bed alarm set ?  ?PT Visit Diagnosis: Muscle weakness (generalized) (M62.81);Difficulty in walking, not elsewhere classified (R26.2) ?  ? ? ?Time: [id number] ?PT Time Calculation (min) (ACUTE ONLY): 21 min ? ?Charges:  $Gait Training: 8-22 mins          ?          ? ? ? ? ? ?Doreatha Massed, PT ?Acute Rehabilitation  ?Office: [phone] ?Pager: [phone] ? ?  ? ?

## 2021-11-19 DIAGNOSIS — D72829 Elevated white blood cell count, unspecified: Secondary | ICD-10-CM

## 2021-11-19 LAB — PROTIME-INR
INR: 2.3 — ABNORMAL HIGH (ref 0.8–1.2)
Prothrombin Time: 25.4 seconds — ABNORMAL HIGH (ref 11.4–15.2)

## 2021-11-19 LAB — CBC WITH DIFFERENTIAL/PLATELET
Abs Immature Granulocytes: 0.05 10*3/uL (ref 0.00–0.07)
Basophils Absolute: 0.1 10*3/uL (ref 0.0–0.1)
Basophils Relative: 1 %
Eosinophils Absolute: 0.6 10*3/uL — ABNORMAL HIGH (ref 0.0–0.5)
Eosinophils Relative: 9 %
HCT: 33.7 % — ABNORMAL LOW (ref 36.0–46.0)
Hemoglobin: 11 g/dL — ABNORMAL LOW (ref 12.0–15.0)
Immature Granulocytes: 1 %
Lymphocytes Relative: 9 %
Lymphs Abs: 0.6 10*3/uL — ABNORMAL LOW (ref 0.7–4.0)
MCH: 30.6 pg (ref 26.0–34.0)
MCHC: 32.6 g/dL (ref 30.0–36.0)
MCV: 93.6 fL (ref 80.0–100.0)
Monocytes Absolute: 0.8 10*3/uL (ref 0.1–1.0)
Monocytes Relative: 12 %
Neutro Abs: 4.8 10*3/uL (ref 1.7–7.7)
Neutrophils Relative %: 68 %
Platelets: 165 10*3/uL (ref 150–400)
RBC: 3.6 MIL/uL — ABNORMAL LOW (ref 3.87–5.11)
RDW: 14.5 % (ref 11.5–15.5)
WBC: 7 10*3/uL (ref 4.0–10.5)
nRBC: 0 % (ref 0.0–0.2)

## 2021-11-19 LAB — BASIC METABOLIC PANEL
Anion gap: 7 (ref 5–15)
BUN: 25 mg/dL — ABNORMAL HIGH (ref 8–23)
CO2: 20 mmol/L — ABNORMAL LOW (ref 22–32)
Calcium: 8.9 mg/dL (ref 8.9–10.3)
Chloride: 97 mmol/L — ABNORMAL LOW (ref 98–111)
Creatinine, Ser: 1.31 mg/dL — ABNORMAL HIGH (ref 0.44–1.00)
GFR, Estimated: 38 mL/min — ABNORMAL LOW (ref >60)
Glucose, Bld: 80 mg/dL (ref 70–99)
Potassium: 4.5 mmol/L (ref 3.5–5.1)
Sodium: 124 mmol/L — ABNORMAL LOW (ref 135–145)

## 2021-11-19 LAB — C-REACTIVE PROTEIN
CRP: 6.6 mg/dL — ABNORMAL HIGH (ref <1.0)
CRP: 4.6 mg/dL — ABNORMAL HIGH (ref <1.0)

## 2021-11-19 LAB — MAGNESIUM: Magnesium: 2 mg/dL (ref 1.7–2.4)

## 2021-11-19 MED ORDER — FUROSEMIDE 10 MG/ML IJ SOLN
60.0000 mg | Freq: Two times a day (BID) | INTRAMUSCULAR | Status: DC
  Administered 2021-11-19 (×2): 60 mg via INTRAVENOUS
  Filled 2021-11-19 (×3): qty 6

## 2021-11-19 MED ORDER — WARFARIN SODIUM 1 MG PO TABS
1.0000 mg | ORAL_TABLET | Freq: Once | ORAL | Status: DC
  Filled 2021-11-19: qty 1

## 2021-11-19 MED ORDER — WARFARIN SODIUM 1 MG PO TABS
1.5000 mg | ORAL_TABLET | Freq: Once | ORAL | Status: AC
  Administered 2021-11-19: 1.5 mg via ORAL
  Filled 2021-11-19: qty 1

## 2021-11-19 NOTE — Progress Notes (Signed)
Physical Therapy Treatment ?Patient Details ?Name: [name] ?MRN: [id number] ?DOB: [birth year] ?Today's Date: 11/19/2021 ? ? ?History of Present Illness 88 yo female admitted with osteomyelitis. S/P R 2nd toe amputation 11/17/21. ? ?  ?PT Comments  ? ? General Comments: AxO x 2 pleasant and following all directions.  Daughter present and very helpful as she has been caring for her mom > 3 years. General transfer comment: 50% VC's on safety with turns assisted to toilet.  Pt prefers to pull up on walker "this is how I do it".  Required assist for peri care and steady for balance.  Pt flexed forward and tends to place R forearm on walker for support. General transfer comment: 50% VC's on safety with turns assisted to toilet.  Pt prefers to pull up on walker "this is how I do it".  Required assist for peri care and steady for balance.  Pt flexed forward and tends to place R forearm on walker for support. General bed mobility comments: assist B LE up onto bed. ?Pt plans to return with daughter who is able to care for her 24/7.  Has all equipment already.  ?Recommendations for follow up therapy are one component of a multi-disciplinary discharge planning process, led by the attending physician.  Recommendations may be updated based on patient status, additional functional criteria and insurance authorization. ? ?Follow Up Recommendations ? Home health PT ?  ?  ?Assistance Recommended at Discharge Frequent or constant Supervision/Assistance  ?Patient can return home with the following A little help with walking and/or transfers;A little help with bathing/dressing/bathroom;Assistance with cooking/housework;Assist for transportation;Help with stairs or ramp for entrance ?  ?Equipment Recommendations ? None recommended by PT  ?  ?Recommendations for Other Services   ? ? ?  ?Precautions / Restrictions Precautions ?Precautions: Fall ?Precaution Comments: post op shoe on R, regular shoe on L foot ?Restrictions ?Weight Bearing  Restrictions: No ?Other Position/Activity Restrictions: WBAT with post op shoe and RW  ?  ? ?Mobility ? Bed Mobility ?Overal bed mobility: Needs Assistance ?Bed Mobility: Sit to Supine ?  ?  ?  ?Sit to supine: Min assist ?  ?General bed mobility comments: assist B LE up onto bed ?  ? ?Transfers ?Overall transfer level: Needs assistance ?Equipment used: Rolling walker (2 wheels) ?Transfers: Sit to/from Stand ?Sit to Stand: Min assist ?  ?  ?  ?  ?  ?General transfer comment: 50% VC's on safety with turns assisted to toilet.  Pt prefers to pull up on walker "this is how I do it".  Required assist for peri care and steady for balance.  Pt flexed forward and tends to place R forearm on walker for support. ?  ? ?Ambulation/Gait ?Ambulation/Gait assistance: Min assist ?Gait Distance (Feet): 20 Feet (10 feet x 2 to and from bathroom) ?Assistive device: Rolling walker (2 wheels) ?Gait Pattern/deviations: Step-through pattern, Decreased stride length, Shuffle, Trunk flexed ?Gait velocity: decreased ?  ?  ?General Gait Details: assisted with amb to and from bathroom with walker at [place] for balance/safety.  Pt tends to lean on walker with her R UE with c/o shoulder pain. ? ? ?Stairs ?  ?  ?  ?  ?  ? ? ?Wheelchair Mobility ?  ? ?Modified Rankin (Stroke Patients Only) ?  ? ? ?  ?Balance   ?  ?  ?  ?  ?  ?  ?  ?  ?  ?  ?  ?  ?  ?  ?  ?  ?  ?  ?  ? ?  ?  Cognition Arousal/Alertness: Awake/alert ?Behavior During Therapy: [facility] for tasks assessed/performed ?Overall Cognitive Status: Within Functional Limits for tasks assessed ?  ?  ?  ?  ?  ?  ?  ?  ?  ?  ?  ?  ?  ?  ?  ?  ?General Comments: AxO x 2 pleasant and following all directions.  Daughter present and very helpful as she has been caring for her mom > 3 years. ?  ?  ? ?  ?Exercises   ? ?  ?General Comments   ?  ?  ? ?Pertinent Vitals/Pain Pain Assessment ?Pain Assessment: Faces ?Faces Pain Scale: Hurts a little bit ?Pain Location: R shoulder.  "foot feels good" ?Pain  Descriptors / Indicators: Aching, Grimacing ?Pain Intervention(s): Monitored during session  ? ? ?Home Living   ?  ?  ?  ?  ?  ?  ?  ?  ?  ?   ?  ?Prior Function    ?  ?  ?   ? ?PT Goals (current goals can now be found in the care plan section) Progress towards PT goals: Progressing toward goals ? ?  ?Frequency ? ? ? Min 3X/week ? ? ? ?  ?PT Plan Current plan remains appropriate  ? ? ?Co-evaluation   ?  ?  ?  ?  ? ?  ?AM-PAC PT "6 Clicks" Mobility   ?Outcome Measure ? Help needed turning from your back to your side while in a flat bed without using bedrails?: A Little ?Help needed moving from lying on your back to sitting on the side of a flat bed without using bedrails?: A Little ?Help needed moving to and from a bed to a chair (including a wheelchair)?: A Little ?Help needed standing up from a chair using your arms (e.g., wheelchair or bedside chair)?: A Little ?Help needed to walk in hospital room?: A Lot ?Help needed climbing 3-5 steps with a railing? : A Lot ?6 Click Score: 16 ? ?  ?End of Session Equipment Utilized During Treatment: Gait belt ?Activity Tolerance: Patient limited by fatigue ?Patient left: in bed;with call bell/phone within reach;with bed alarm set ?Nurse Communication: Mobility status ?PT Visit Diagnosis: Muscle weakness (generalized) (M62.81);Difficulty in walking, not elsewhere classified (R26.2) ?  ? ? ?Time: 1436-1500 ?PT Time Calculation (min) (ACUTE ONLY): 24 min ? ?Charges:  $Gait Training: 8-22 mins ?$Therapeutic Activity: 8-22 mins          ?          ? ?Rica Koyanagi  PTA ?Acute  Rehabilitation Services ?Pager      [phone] ?Office      [phone] ? ?

## 2021-11-19 NOTE — Progress Notes (Signed)
ANTICOAGULATION CONSULT NOTE  ? ?Pharmacy Consult for warfarin ?Indication: hx atrial fibrillation and pulmonary embolus ? ?Allergies  ?Allergen Reactions  ? Acetaminophen   ? Amoxicillin   ? Azithromycin   ?  Other reaction(s): unsure  ? Codeine   ?  Other reaction(s): unsure  ? Erythromycin   ? Erythromycin Ethylsuccinate   ? Green Dyes Other (See Comments)  ?  Allergic to ALL dyes  ? Iodine   ? Misc. Sulfonamide Containing Compounds   ? Oxycodone   ? Oxycodone-Acetaminophen   ? Penicillins   ? Sulfasalazine   ? Tramadol Hives  ? ? ?Patient Measurements: ?Height: 5\' 2"  (157.5 cm) ?Weight: 96.1 kg (211 lb 13.8 oz) ?IBW/kg (Calculated) : 50.1 ? ?Vital Signs: ?Temp: 97.7 ?F (36.5 ?C) (03/14 0551) ?Temp Source: Oral (03/14 0551) ?BP: 151/131 (03/14 0551) ?Pulse Rate: 75 (03/14 0551) ? ?Labs: ?Recent Labs  ?  11/17/21 ?0602 11/18/21 ?0427 11/19/21 ?[id number]  ?HGB 10.8* 10.7* 11.0*  ?HCT 34.4* 33.2* 33.7*  ?PLT 135* 145* 165  ?LABPROT 29.2* 25.1* 25.4*  ?INR 2.8* 2.3* 2.3*  ?CREATININE 1.94* 1.52* 1.31*  ? ? ? ?Estimated Creatinine Clearance: 30.2 mL/min (A) (by C-G formula based on SCr of 1.31 mg/dL (H)). ? ? ?Medications:  ?- PTA warfarin regimen: 1mg  on M/W/F and 2mg  on Tues/Thurs/Sat/Sun (confirmed on 3/14 with [place] office and patient's daughter) - last dose PTA taken on 3/9 ? ?Assessment: ?Patient is a 88 y.o F with hx PE and afib on warfarin PTA, presented to the ED on 11/15/2021 with c/o leg pain, right second toe ulcer and generalized weakness. Right foot MRI on 11/15/21 showed findings that's suspicious of right second toe osteomyelitis. She subsequently underwent right second toe amputation on 11/17/21. Pharmacy has been consulted to resume warfarin on 11/18/21. ? ?Today, 11/19/2021: ?- INR is therapeutic at 2.3  ?- CBC: Hgb 11, low but stable. Pltc WNL. ?- No bleeding documented ?- No major drug interactions noted (antibiotics discontinued yesterday) ? ?Goal of Therapy:  ?INR 2-3 ?Monitor platelets by  anticoagulation protocol: Yes ?  ?Plan:  ?- Warfarin 1.5 mg PO x 1 today ?- Daily PT/INR ?- Monitor CBC and for s/sx of bleeding ? ? ?Lindell Spar, PharmD, BCPS ?Clinical Pharmacist  ?11/19/2021,7:41 AM ? ? ?

## 2021-11-19 NOTE — Care Management Important Message (Signed)
Important Message ? ?Patient Details IM Letter placed in Patients room. ?Name: [name] ?MRN: [id number] ?Date of Birth: June 18, 1928 ? ? ?Medicare Important Message Given:  Yes ? ? ? ? ?Kerin Salen ?11/19/2021, 10:30 AM ?

## 2021-11-19 NOTE — Progress Notes (Signed)
?Progress Note ? ? ?Patient: [name] [id number] DOB: 1928-06-05 DOA: 11/15/2021     3 ?DOS: the patient was seen and examined on 11/19/2021 ?  ?Brief hospital course: ?88 y.o. female with medical history significant of A-fib, CHF, GERD, hypertension, pulmonary embolism, thyroid disease, COPD, pulmonary hypertension, anxiety, hyperlipidemia, venous insufficiency presented with leg wound and pain and generalized weakness.  On presentation, right foot x-ray showed concern for possible osteomyelitis and recommended MRI.  Patient was started on broad-spectrum antibiotics.  Orthopedics was consulted ? ?Assessment and Plan: ?* Osteomyelitis of second toe of right foot ([place]) ?Lower extremity cellulitis ?Right lower shin ulcer ?-Status post right second toe amputation on 11/25/2021.  Wound care as per orthopedics recommendations. ?-Antibiotics discontinued on 11/18/2021 after discussion with orthopedics team. ?-PT recommends HHPT ? ?Chronic diastolic CHF (congestive heart failure) ([place]) ?Hypertension ?Chronic pulmonary hypertension ?- Currently compensated.  Strict input and output.  Daily weights.  Last echo in 2019 in [place] showed EF of greater than 70% with severe pulmonary hypertension and severe tricuspid regurgitation ?-Strict input and output.  Daily weights.  Fluid restriction.  Continue metoprolol.  Held losartan, Bumex, spironolactone on 11/17/2021 because of acute kidney injury.  Outpatient follow-up with PCP/cardiology ?-will start IV Lasix today because of worsening hyponatremia. ? ?Staph epidermidis bacteremia ?-Only in 1 bottle.  Possible contaminant. ? ?Permanent atrial fibrillation ([place]) ?- Resumed Coumadin on 11/18/2021 after orthopedics gave clearance.  Continue metoprolol.  Outpatient follow-up with cardiology.  Follow INR. ? ?Acute kidney injury on CKD (chronic kidney disease), stage III ([place]) ?CKD IIIb ?-Creatinine has improved to 1.31 today.  IV fluids discontinued on 11/18/2021.  Diuretics  and losartan on hold.  Will give IV Lasix today.  Repeat a.m. labs. ? ?Hyponatremia ?-Worsening.  Sodium 124 today.  Diuretic plan as above.  Repeat a.m. sodium.   ? ?Hypothyroidism ?- Continue Synthroid ? ?Gastroesophageal reflux disease ?- Continue PPI ? ?Leukocytosis ?-resolved ? ?Anemia of chronic disease ?-Hemoglobin stable ? ?Thrombocytopenia ?-Resolved ? ? ? ?  ? ?Subjective:  ?Patient seen and examined at bedside.  Poor historian.  Hard of hearing.  No agitation, seizures or fever reported.   ?Physical Exam: ?Vitals:  ? 11/18/21 0527 11/18/21 1310 11/18/21 2055 11/19/21 0551  ?BP: 127/73 137/69 94/77 (!) 151/131  ?Pulse: 60 (!) 48 (!) 56 75  ?Resp: 14 16 20 16   ?Temp: 97.6 ?F (36.4 ?C) 97.9 ?F (36.6 ?C) 98.5 ?F (36.9 ?C) 97.7 ?F (36.5 ?C)  ?TempSrc: Oral Oral Oral Oral  ?SpO2: 98% 94% 92% 92%  ?Weight: 96.1 kg     ?Height:      ? ?General: On room air.  No distress.  Elderly female lying in bed. ?ENT/neck: No thyromegaly.  JVD is not elevated  ?respiratory: Decreased breath sounds at bases bilaterally with some crackles; no wheezing CVS: S1-S2 heard, intermittently bradycardic  ?abdominal: Soft, nontender, slightly distended; no organomegaly, normal bowel sounds are heard ?Extremities: No clubbing; lower extremity edema present  ?CNS: Wakes up slightly, very slow to respond.  Poor historian.  No focal neurologic deficit.  Moves extremities ?Lymph: No obvious lymphadenopathy ?Skin: No obvious ecchymosis/lesions  ?psych: Does not participate in conversation much.  Affect is mostly flat.   ?Musculoskeletal: Right foot dressing present ? ?Data Reviewed: ?I have reviewed patient's investigation including blood work and imaging done during this hospitalization myself.  Today sodium is 124, creatinine 1.31, WBC 7, hemoglobin 11, platelets 165, INR 2.3 ?Family Communication: spoke to daughter and granddaughter at bedside  on 11/17/2021 ? ?Disposition: ?Status is: Inpatient ?Remains inpatient appropriate because: Of  severity of illness.  Need for IV antibiotics ? Planned Discharge Destination: Home ? ? ? ?Author: ?Aline August, MD ?11/19/2021 10:38 AM ? ?For on call review [email or web address].  ?

## 2021-11-20 DIAGNOSIS — R531 Weakness: Secondary | ICD-10-CM

## 2021-11-20 DIAGNOSIS — R7881 Bacteremia: Secondary | ICD-10-CM

## 2021-11-20 LAB — CBC WITH DIFFERENTIAL/PLATELET
Abs Immature Granulocytes: 0.04 10*3/uL (ref 0.00–0.07)
Basophils Absolute: 0.1 10*3/uL (ref 0.0–0.1)
Basophils Relative: 1 %
Eosinophils Absolute: 0.5 10*3/uL (ref 0.0–0.5)
Eosinophils Relative: 9 %
HCT: 34.7 % — ABNORMAL LOW (ref 36.0–46.0)
Hemoglobin: 11.4 g/dL — ABNORMAL LOW (ref 12.0–15.0)
Immature Granulocytes: 1 %
Lymphocytes Relative: 13 %
Lymphs Abs: 0.7 10*3/uL (ref 0.7–4.0)
MCH: 30.7 pg (ref 26.0–34.0)
MCHC: 32.9 g/dL (ref 30.0–36.0)
MCV: 93.5 fL (ref 80.0–100.0)
Monocytes Absolute: 0.7 10*3/uL (ref 0.1–1.0)
Monocytes Relative: 13 %
Neutro Abs: 3.5 10*3/uL (ref 1.7–7.7)
Neutrophils Relative %: 63 %
Platelets: 181 10*3/uL (ref 150–400)
RBC: 3.71 MIL/uL — ABNORMAL LOW (ref 3.87–5.11)
RDW: 14.5 % (ref 11.5–15.5)
WBC: 5.5 10*3/uL (ref 4.0–10.5)
nRBC: 0 % (ref 0.0–0.2)

## 2021-11-20 LAB — BASIC METABOLIC PANEL
Anion gap: 10 (ref 5–15)
BUN: 24 mg/dL — ABNORMAL HIGH (ref 8–23)
CO2: 22 mmol/L (ref 22–32)
Calcium: 9.1 mg/dL (ref 8.9–10.3)
Chloride: 96 mmol/L — ABNORMAL LOW (ref 98–111)
Creatinine, Ser: 1.16 mg/dL — ABNORMAL HIGH (ref 0.44–1.00)
GFR, Estimated: 45 mL/min — ABNORMAL LOW (ref >60)
Glucose, Bld: 95 mg/dL (ref 70–99)
Potassium: 3.8 mmol/L (ref 3.5–5.1)
Sodium: 128 mmol/L — ABNORMAL LOW (ref 135–145)

## 2021-11-20 LAB — CULTURE, BLOOD (ROUTINE X 2): Culture: NO GROWTH

## 2021-11-20 LAB — C-REACTIVE PROTEIN: CRP: 4.7 mg/dL — ABNORMAL HIGH (ref <1.0)

## 2021-11-20 LAB — MAGNESIUM: Magnesium: 1.9 mg/dL (ref 1.7–2.4)

## 2021-11-20 LAB — PROTIME-INR
INR: 2.6 — ABNORMAL HIGH (ref 0.8–1.2)
Prothrombin Time: 27.6 seconds — ABNORMAL HIGH (ref 11.4–15.2)

## 2021-11-20 MED ORDER — LOSARTAN POTASSIUM 50 MG PO TABS
50.0000 mg | ORAL_TABLET | Freq: Every day | ORAL | Status: DC
  Administered 2021-11-20 – 2021-11-21 (×2): 50 mg via ORAL
  Filled 2021-11-20 (×2): qty 1

## 2021-11-20 MED ORDER — SPIRONOLACTONE 25 MG PO TABS: 25.0000 mg | ORAL_TABLET | Freq: Every day | ORAL | 0 refills | Status: DC

## 2021-11-20 MED ORDER — LOSARTAN POTASSIUM 100 MG PO TABS: 100.0000 mg | ORAL_TABLET | Freq: Every day | ORAL | Status: DC

## 2021-11-20 MED ORDER — BUMETANIDE 1 MG PO TABS: ORAL_TABLET | ORAL | 0 refills | Status: DC

## 2021-11-20 MED ORDER — SENNOSIDES-DOCUSATE SODIUM 8.6-50 MG PO TABS: 1.0000 | ORAL_TABLET | Freq: Two times a day (BID) | ORAL | 0 refills | Status: DC | PRN

## 2021-11-20 MED ORDER — BUMETANIDE 1 MG PO TABS
1.0000 mg | ORAL_TABLET | Freq: Two times a day (BID) | ORAL | Status: DC
  Administered 2021-11-20 – 2021-11-21 (×2): 1 mg via ORAL
  Filled 2021-11-20 (×3): qty 1

## 2021-11-20 MED ORDER — WARFARIN 0.5 MG HALF TABLET
0.5000 mg | ORAL_TABLET | Freq: Once | ORAL | Status: AC
  Administered 2021-11-20: 0.5 mg via ORAL
  Filled 2021-11-20: qty 1

## 2021-11-20 MED ORDER — BUMETANIDE 1 MG PO TABS
1.0000 mg | ORAL_TABLET | Freq: Once | ORAL | Status: AC
  Administered 2021-11-20: 1 mg via ORAL
  Filled 2021-11-20: qty 1

## 2021-11-20 NOTE — Assessment & Plan Note (Addendum)
Likely from anxiety and holding home antihypertensive meds.  Improved after resuming losartan at lower dose. ?-Continue losartan at 50 mg daily in the setting of hyponatremia.  Was on 100 mg daily at home ?-Continue Toprol-XL 50 mg daily ?-Continue Bumex 1 mg twice daily ?-Hold Aldactone for 5 more days given hyponatremia ?-Reassess blood pressure, renal functions and electrolytes, and adjust meds as appropriate ?

## 2021-11-20 NOTE — Assessment & Plan Note (Signed)
PT/OT recommended home health and DME ?

## 2021-11-20 NOTE — Progress Notes (Signed)
Occupational Therapy Treatment ?Patient Details ?Name: [name] ?MRN: [id number] ?DOB: [birth year] ?Today's Date: 11/20/2021 ? ? ?History of present illness 88 yo female admitted with osteomyelitis. S/P R 2nd toe amputation 11/17/21. ?  ?OT comments ? Today patient limited by drowsiness and reports of pain in left foot. She was min assist to power up and min guard to take minimal steps to [facility] and then recliner with walker. She stays bent over walker and bears weight through right forearm instead of hands. She exhibits some heavy breathing with minimal activity but o2 sats 91% and above. Patient reports fatigue and being cold.  Patient has 24/7 assistance at home and has a BSC.   ? ?Recommendations for follow up therapy are one component of a multi-disciplinary discharge planning process, led by the attending physician.  Recommendations may be updated based on patient status, additional functional criteria and insurance authorization. ?   ?Follow Up Recommendations ? Home health OT  ?  ?Assistance Recommended at Discharge Frequent or constant Supervision/Assistance  ?Patient can return home with the following ? A little help with walking and/or transfers;Help with stairs or ramp for entrance;Assist for transportation;Assistance with cooking/housework;Direct supervision/assist for financial management;Direct supervision/assist for medications management;A lot of help with bathing/dressing/bathroom ?  ?Equipment Recommendations ? None recommended by OT  ?  ?Recommendations for Other Services   ? ?  ?Precautions / Restrictions Precautions ?Precautions: Fall ?Precaution Comments: post op shoe on R, regular shoe on L foot ?Restrictions ?Weight Bearing Restrictions: No ?Other Position/Activity Restrictions: WBAT with post op shoe and RW  ? ? ?  ? ?Mobility Bed Mobility ?Overal bed mobility: Needs Assistance ?Bed Mobility: Supine to Sit ?  ?  ?Supine to sit: Min guard, HOB elevated ?  ?  ?  ?  ? ?Transfers ?Overall transfer  level: Needs assistance ?Equipment used: Rolling walker (2 wheels) ?Transfers: Sit to/from Stand, Bed to chair/wheelchair/BSC ?Sit to Stand: Min assist, From elevated surface ?Stand pivot transfers: Min guard ?  ?  ?  ?  ?General transfer comment: min assist to power up and min guard with RW to take steps to [facility] then recliner. ?  ?  ?Balance Overall balance assessment: Needs assistance ?Sitting-balance support: No upper extremity supported, Feet supported ?Sitting balance-Leahy Scale: Good ?  ?  ?Standing balance support: Reliant on assistive device for balance ?Standing balance-Leahy Scale: Poor ?  ?  ?  ?  ?  ?  ?  ?  ?  ?  ?  ?  ?   ? ?ADL either performed or assessed with clinical judgement  ? ?ADL Overall ADL's : Needs assistance/impaired ?  ?  ?  ?  ?  ?  ?  ?  ?  ?  ?Lower Body Dressing: Sit to/from stand;Maximal assistance ?Lower Body Dressing Details (indicate cue type and reason): needed assistance to don shoes. demonstrates the need to have assistance to pull up clothing today ?Toilet Transfer: Stand-pivot;Rolling walker (2 wheels);BSC/3in1;Minimal assistance ?Toilet Transfer Details (indicate cue type and reason): transferred to [facility] usnig rw. min assist to power up ?Toileting- Clothing Manipulation and Hygiene: Maximal assistance;Sit to/from stand ?Toileting - Clothing Manipulation Details (indicate cue type and reason): needs assistance for clothing management while she held on to walker ?  ?  ?Functional mobility during ADLs: Rolling walker (2 wheels);Minimal assistance ?General ADL Comments: min assist, increased time and RW to power up into standing. patient pulling up on walker and bears through right elbow on walker. ?  ? ? ? ?  Cognition Arousal/Alertness: Awake/alert ?Behavior During Therapy: [facility] for tasks assessed/performed ?Overall Cognitive Status: Within Functional Limits for tasks assessed ?  ?  ?  ?  ?  ?  ?  ?  ?  ?  ?  ?  ?  ?  ?  ?  ?General Comments: Reports being sleepy today from not  sleeping last night ?  ?  ?   ?   ?   ?   ? ? ?Pertinent Vitals/ Pain       Pain Assessment ?Pain Assessment: Faces ?Faces Pain Scale: Hurts little more ?Pain Location: L foot ?Pain Descriptors / Indicators: Aching, Grimacing ?Pain Intervention(s): Monitored during session ? ? ?Frequency ? Min 2X/week  ? ? ? ? ?  ?Progress Toward Goals ? ?OT Goals(current goals can now be found in the care plan section) ? Progress towards OT goals: OT to reassess next treatment ? ?Acute Rehab OT Goals ?Patient Stated Goal: sleep ?OT Goal Formulation: With patient/family ?Time For Goal Achievement: 12/02/21 ?Potential to Achieve Goals: Good  ?Plan Discharge plan remains appropriate   ? ?Co-evaluation ? ? ?   ?  ?  ?  ?  ? ?  ?AM-PAC OT "6 Clicks" Daily Activity     ?Outcome Measure ? ? Help from another person eating meals?: None ?Help from another person taking care of personal grooming?: A Little ?Help from another person toileting, which includes using toliet, bedpan, or urinal?: A Little ?Help from another person bathing (including washing, rinsing, drying)?: A Lot ?Help from another person to put on and taking off regular upper body clothing?: A Little ?Help from another person to put on and taking off regular lower body clothing?: A Lot ?6 Click Score: 17 ? ?  ?End of Session Equipment Utilized During Treatment: Rolling walker (2 wheels) ? ?OT Visit Diagnosis: Unsteadiness on feet (R26.81);Other abnormalities of gait and mobility (R26.89);Muscle weakness (generalized) (M62.81);Pain ?Pain - Right/Left: Right ?  ?Activity Tolerance Patient limited by fatigue ?  ?Patient Left in chair;with call bell/phone within reach;with chair alarm set ?  ?Nurse Communication Mobility status ?  ? ?   ? ?Time: 2035-5974 ?OT Time Calculation (min): 26 min ? ?Charges: OT General Charges ?$OT Visit: 1 Visit ?OT Treatments ?$Self Care/Home Management : 8-22 mins ?$Therapeutic Activity: 8-22 mins ? ?Chevon Laufer, OTR/L ?Acute Care Rehab Services  ?Office  [phone] ?Pager: [phone]  ? ?Chistian Kasler L Dayane Hillenburg ?11/20/2021, 11:59 AM ?

## 2021-11-20 NOTE — Progress Notes (Signed)
A consult was placed to the IV Therapist for a new iv site, as the pt's BP was elevated;  pt had been given an oral medication approx 30 minutes prior to IV assessment;  pt currently doesn't have any IV Medication ordered for high BP;  staff to recheck pt's BP, and if an IV medication is ordered, RN will re-consult IV Therapy; pt with multiple bruises and stated "I don't want to be stuck anymore."   Family at bedside;  explained to pt's daughter that if an IV is needed, one would be placed.   ?

## 2021-11-20 NOTE — Assessment & Plan Note (Signed)
Blood culture with Staph epidermidis in 1 out of 2 bottles likely contaminant.  Bacteremia ruled out. ?

## 2021-11-20 NOTE — Progress Notes (Signed)
PROGRESS NOTE  [name]:[id number] DOB: Nov 17, 1927   PCP: [name], MD  Patient is from: Home  DOA: 11/15/2021 LOS: 4  Chief complaints Chief Complaint  Patient presents with   Possible UTI   Leg Pain     Brief Narrative / Interim history: 88 y.o. female with medical history significant of A-fib, CHF, GERD, hypertension, pulmonary embolism, thyroid disease, COPD, pulmonary hypertension, anxiety, hyperlipidemia, venous insufficiency presented with leg wound and pain and generalized weakness.  On presentation, right foot x-ray showed concern for possible osteomyelitis and recommended MRI.  Patient was started on broad-spectrum antibiotics.  Orthopedic surgery consulted.  She underwent right second toe amputation on 11/25/2021 with source control.  Antibiotics discontinued.  Therapy recommended home health and DME.  Hospital course complicated by hyponatremia, AKI and uncontrolled hypertension.   Subjective: Seen and examined earlier this morning.  No major events overnight of this morning.  Patient was sleepy but wakes to voice earlier this morning.  She says she did not have good sleep.  She denies chest pain, dyspnea, GI or UTI symptoms.  Objective: Vitals:   11/20/21 1449 11/20/21 1552 11/20/21 1610 11/20/21 1617  BP: (!) 195/74 (!) 206/65 (!) 190/88 (!) 192/78  Pulse: 72 84    Resp: 17     Temp: 98 F (36.7 C)     TempSrc: Oral     SpO2: 96%     Weight:      Height:        Examination:  GENERAL: No apparent distress.  Nontoxic. HEENT: MMM.  Vision and hearing grossly intact.  NECK: Supple.  No apparent JVD.  RESP:  No IWOB.  Fair aeration bilaterally. CVS:  RRR. Heart sounds normal.  ABD/GI/GU: BS+. Abd soft, NTND.  MSK/EXT:  Moves extremities.  Dressing over right foot DCI. SKIN: no apparent skin lesion or wound NEURO: Awake, alert and oriented appropriately.  No apparent focal neuro deficit. PSYCH: Calm. Normal affect.   Procedures:  3/12-right  second toe amputation  Microbiology summarized: COVID-19 and influenza PCR nonreactive. MRSA PCR negative. Blood culture with Staph epidermidis in 1 out of 2 bottles  Assessment and Plan: * Osteomyelitis of second toe of right foot (HCC) S/p right second toe amputation with infection source control on 11/15/2021. Broad-spectrum antibiotics discontinued Wound care and pain control per orthopedic surgery  AKI on CKD-3B Recent Labs    11/15/21 1743 11/16/21 0709 11/17/21 0602 11/18/21 0427 11/19/21 0506 11/20/21 0514  BUN 24* 24* 32* 32* 25* 24*  CREATININE 1.29* 1.30* 1.94* 1.52* 1.31* 1.16*  AKI seems to have resolved. Decreased home losartan.  Holding Aldactone. -Recheck in the morning   Hyponatremia Recent Labs  Lab 11/15/21 1743 11/16/21 0709 11/17/21 0602 11/18/21 0427 11/19/21 0506 11/20/21 0514  NA 130* 130* 126* 125* 124* 128*  Improved.  Continue Bumex.  Continue holding Aldactone.  Continue fluid restriction.  Recheck in the morning   Chronic diastolic CHF (congestive heart failure) (HCC) Last TTE in 2019 in [place] showed EF > 70% with severe P HTN and severe TVR.  She has chronic venous insufficiency.  She is on Bumex and Aldactone at home.  Diuresed with IV Lasix.  She had about 2.2 L UOP/24 hours.  Still net +1 L.  Creatinine improved. -Resume home Bumex 1 mg twice daily -Continue holding Aldactone in the setting of hyponatremia -Monitor renal functions, electrolytes and fluid status -Sodium and fluid restriction  Uncontrolled hypertension SBP in the range of 190s to 200.  They stated in the setting of holding her losartan and Aldactone for hyponatremia -P.o. Bumex 1 mg twice daily and state of home Lasix. -Resume home losartan at 50 mg daily -Continue Toprol-XL 50 mg daily -P.o. hydralazine 25 mg every 6 hours as needed  Permanent atrial fibrillation (HCC) Continue Toprol-XL and home warfarin.  Hypothyroidism - Continue  Synthroid  Gastroesophageal reflux disease - Continue PPI  Generalized weakness PT/OT recommended home health and DME  Positive blood culture Blood culture with Staph epidermidis in 1 out of 2 bottles likely contaminant.  Bacteremia ruled out.  Leukocytosis -resolved   Class II obesity Body mass index is 36.21 kg/m.         DVT prophylaxis:  SCDs Start: 11/17/21 1610  Code Status: DNR Family Communication: Patient and/or RN. Available if any question.  Level of care: Telemetry Status is: Inpatient Remains inpatient appropriate because: Due to uncontrolled hypertension, hyponatremia   Final disposition: Home with home health  Consultants:  Orthopedic surgery  Sch Meds:  Scheduled Meds:  bumetanide  1 mg Oral BID   docusate sodium  100 mg Oral BID   gabapentin  100 mg Oral BID   levothyroxine  112 mcg Oral Q0600   losartan  50 mg Oral Daily   metoprolol succinate  50 mg Oral Daily   mupirocin ointment  1 application. Nasal BID   pantoprazole  40 mg Oral Daily   sodium chloride flush  3 mL Intravenous Q12H   Warfarin - Pharmacist Dosing Inpatient   Does not apply q1600   Continuous Infusions: PRN Meds:.acetaminophen **OR** acetaminophen, hydrALAZINE, metoCLOPramide **OR** metoCLOPramide (REGLAN) injection, ondansetron **OR** ondansetron (ZOFRAN) IV, polyethylene glycol  Antimicrobials: Anti-infectives (From admission, onward)    Start     Dose/Rate Route Frequency Ordered Stop   11/19/21 2200  vancomycin (VANCOCIN) IVPB 1000 mg/200 mL premix  Status:  Discontinued        1,000 mg 200 mL/hr over 60 Minutes Intravenous Every 48 hours 11/18/21 0843 11/18/21 1121   11/17/21 2200  vancomycin (VANCOCIN) IVPB 1000 mg/200 mL premix  Status:  Discontinued        1,000 mg 200 mL/hr over 60 Minutes Intravenous Every 48 hours 11/15/21 2110 11/17/21 1356   11/17/21 2200  vancomycin (VANCOREADY) IVPB 750 mg/150 mL  Status:  Discontinued        750 mg 150 mL/hr over 60  Minutes Intravenous Every 48 hours 11/17/21 1356 11/18/21 0843   11/17/21 0727  ceFAZolin (ANCEF) 2-4 GM/100ML-% IVPB       Note to Pharmacy: Crista [facility] L: cabinet override      11/17/21 0727 11/17/21 0737   11/17/21 0600  ceFAZolin (ANCEF) IVPB 2g/100 mL premix        2 g 200 mL/hr over 30 Minutes Intravenous On call to O.R. 11/16/21 1402 11/17/21 0807   11/16/21 2100  ceFEPIme (MAXIPIME) 2 g in sodium chloride 0.9 % 100 mL IVPB  Status:  Discontinued        2 g 200 mL/hr over 30 Minutes Intravenous Every 24 hours 11/15/21 2110 11/18/21 1121   11/16/21 1200  metroNIDAZOLE (FLAGYL) IVPB 500 mg  Status:  Discontinued        500 mg 100 mL/hr over 60 Minutes Intravenous 2 times daily 11/16/21 1036 11/18/21 1121   11/15/21 2000  ceFEPIme (MAXIPIME) 2 g in sodium chloride 0.9 % 100 mL IVPB        2 g 200 mL/hr over 30 Minutes Intravenous  Once 11/15/21  1951 11/15/21 2059   11/15/21 2000  vancomycin (VANCOREADY) IVPB 2000 mg/400 mL        2,000 mg 200 mL/hr over 120 Minutes Intravenous  Once 11/15/21 1951 11/16/21 0700   11/15/21 1945  aztreonam (AZACTAM) 2 g in sodium chloride 0.9 % 100 mL IVPB  Status:  Discontinued        2 g 200 mL/hr over 30 Minutes Intravenous  Once 11/15/21 1943 11/15/21 1951   11/15/21 1945  metroNIDAZOLE (FLAGYL) IVPB 500 mg        500 mg 100 mL/hr over 60 Minutes Intravenous  Once 11/15/21 1943 11/16/21 0700   11/15/21 1945  vancomycin (VANCOCIN) IVPB 1000 mg/200 mL premix  Status:  Discontinued        1,000 mg 200 mL/hr over 60 Minutes Intravenous  Once 11/15/21 1943 11/15/21 1951        I have personally reviewed the following labs and images: CBC: Recent Labs  Lab 11/15/21 1743 11/16/21 0709 11/17/21 0602 11/18/21 0427 11/19/21 0506 11/20/21 0514  WBC 12.4* 10.4 6.9 6.7 7.0 5.5  NEUTROABS 11.3*  --  4.4 4.4 4.8 3.5  HGB 11.7* 10.5* 10.8* 10.7* 11.0* 11.4*  HCT 35.9* 33.1* 34.4* 33.2* 33.7* 34.7*  MCV 93.0 95.4 96.4 93.0 93.6 93.5  PLT 174  144* 135* 145* 165 181   BMP &GFR Recent Labs  Lab 11/16/21 0709 11/17/21 0602 11/18/21 0427 11/19/21 0506 11/20/21 0514  NA 130* 126* 125* 124* 128*  K 4.6 4.4 4.4 4.5 3.8  CL 97* 95* 96* 97* 96*  CO2 24 21* 21* 20* 22  GLUCOSE 100* 104* 98 80 95  BUN 24* 32* 32* 25* 24*  CREATININE 1.30* 1.94* 1.52* 1.31* 1.16*  CALCIUM 8.8* 8.8* 8.6* 8.9 9.1  MG  --  2.1 2.1 2.0 1.9   Estimated Creatinine Clearance: 32.9 mL/min (A) (by C-G formula based on SCr of 1.16 mg/dL (H)). Liver & Pancreas: Recent Labs  Lab 11/15/21 1743 11/16/21 0709  AST 30 26  ALT 20 18  ALKPHOS 141* 112  BILITOT 0.8 1.2  PROT 6.7 5.9*  ALBUMIN 3.3* 2.9*   No results for input(s): LIPASE, AMYLASE in the last 168 hours. No results for input(s): AMMONIA in the last 168 hours. Diabetic: No results for input(s): HGBA1C in the last 72 hours. No results for input(s): GLUCAP in the last 168 hours. Cardiac Enzymes: No results for input(s): CKTOTAL, CKMB, CKMBINDEX, TROPONINI in the last 168 hours. No results for input(s): PROBNP in the last 8760 hours. Coagulation Profile: Recent Labs  Lab 11/16/21 0709 11/17/21 0602 11/18/21 0427 11/19/21 0506 11/20/21 0514  INR 2.8* 2.8* 2.3* 2.3* 2.6*   Thyroid Function Tests: No results for input(s): TSH, T4TOTAL, FREET4, T3FREE, THYROIDAB in the last 72 hours. Lipid Profile: No results for input(s): CHOL, HDL, LDLCALC, TRIG, CHOLHDL, LDLDIRECT in the last 72 hours. Anemia Panel: No results for input(s): VITAMINB12, FOLATE, FERRITIN, TIBC, IRON, RETICCTPCT in the last 72 hours. Urine analysis:    Component Value Date/Time   COLORURINE YELLOW 11/15/2021 1743   APPEARANCEUR CLEAR 11/15/2021 1743   LABSPEC 1.012 11/15/2021 1743   PHURINE 5.0 11/15/2021 1743   GLUCOSEU NEGATIVE 11/15/2021 1743   HGBUR NEGATIVE 11/15/2021 1743   BILIRUBINUR NEGATIVE 11/15/2021 1743   KETONESUR NEGATIVE 11/15/2021 1743   PROTEINUR 30 (A) 11/15/2021 1743   NITRITE NEGATIVE  11/15/2021 1743   LEUKOCYTESUR NEGATIVE 11/15/2021 1743   Sepsis Labs: Invalid input(s): PROCALCITONIN, LACTICIDVEN  Microbiology: Recent Results (from the  past 240 hour(s))  Resp Panel by RT-PCR (Flu A&B, Covid) Nasopharyngeal Swab     Status: None   Collection Time: 11/15/21  5:44 PM   Specimen: Nasopharyngeal Swab; Nasopharyngeal(NP) swabs in vial transport medium  Result Value Ref Range Status   SARS Coronavirus 2 by RT PCR NEGATIVE NEGATIVE Final    Comment: (NOTE) SARS-CoV-2 target nucleic acids are NOT DETECTED.  The SARS-CoV-2 RNA is generally detectable in upper respiratory specimens during the acute phase of infection. The lowest concentration of SARS-CoV-2 viral copies this assay can detect is 138 copies/mL. A negative result does not preclude SARS-Cov-2 infection and should not be used as the sole basis for treatment or other patient management decisions. A negative result may occur with  improper specimen collection/handling, submission of specimen other than nasopharyngeal swab, presence of viral mutation(s) within the areas targeted by this assay, and inadequate number of viral copies(<138 copies/mL). A negative result must be combined with clinical observations, patient history, and epidemiological information. The expected result is Negative.  Fact Sheet for Patients:  BloggerCourse.com  Fact Sheet for Healthcare Providers:  SeriousBroker.it  This test is no t yet approved or cleared by the [place] FDA and  has been authorized for detection and/or diagnosis of SARS-CoV-2 by FDA under an Emergency Use Authorization (EUA). This EUA will remain  in effect (meaning this test can be used) for the duration of the COVID-19 declaration under Section 564(b)(1) of the Act, 21 U.S.C.section 360bbb-3(b)(1), unless the authorization is terminated  or revoked sooner.       Influenza A by PCR NEGATIVE NEGATIVE Final    Influenza B by PCR NEGATIVE NEGATIVE Final    Comment: (NOTE) The Xpert Xpress SARS-CoV-2/FLU/RSV plus assay is intended as an aid in the diagnosis of influenza from Nasopharyngeal swab specimens and should not be used as a sole basis for treatment. Nasal washings and aspirates are unacceptable for Xpert Xpress SARS-CoV-2/FLU/RSV testing.  Fact Sheet for Patients: BloggerCourse.com  Fact Sheet for Healthcare Providers: SeriousBroker.it  This test is not yet approved or cleared by the [place] FDA and has been authorized for detection and/or diagnosis of SARS-CoV-2 by FDA under an Emergency Use Authorization (EUA). This EUA will remain in effect (meaning this test can be used) for the duration of the COVID-19 declaration under Section 564(b)(1) of the Act, 21 U.S.C. section 360bbb-3(b)(1), unless the authorization is terminated or revoked.  Performed at [facility], [address] [place], [place] [address]   Blood culture (routine x 2)     Status: None   Collection Time: 11/15/21  6:00 PM   Specimen: BLOOD  Result Value Ref Range Status   Specimen Description   Final    BLOOD RIGHT ANTECUBITAL Performed at [facility], [address], [place], [place] [address]    Special Requests   Final    BOTTLES DRAWN AEROBIC AND ANAEROBIC Blood Culture results may not be optimal due to an inadequate volume of blood received in culture bottles Performed at [facility], [address] [place], [place] [address]    Culture   Final    NO GROWTH 5 DAYS Performed at [facility], [address], [place], [place] [address]    Report Status 11/20/2021 FINAL  Final  Blood culture (routine x 2)     Status: Abnormal   Collection Time: 11/15/21  6:10 PM   Specimen: BLOOD  Result Value Ref Range Status  Specimen Description   Final    BLOOD LEFT ANTECUBITAL Performed at  [facility], [address], [place], [place] [address]    Special Requests   Final    BOTTLES DRAWN AEROBIC AND ANAEROBIC Blood Culture adequate volume Performed at [facility], [address], [place], [place] [address]    Culture  Setup Time   Final    GRAM POSITIVE COCCI AEROBIC BOTTLE ONLY CRITICAL RESULT CALLED TO, READ BACK BY AND VERIFIED WITH: PHARMD ELLEN JACKSON 11/16/21@23 :52 BY TW    Culture (A)  Final    STAPHYLOCOCCUS EPIDERMIDIS THE SIGNIFICANCE OF ISOLATING THIS ORGANISM FROM A SINGLE SET OF BLOOD CULTURES WHEN MULTIPLE SETS ARE DRAWN IS UNCERTAIN. PLEASE NOTIFY THE MICROBIOLOGY DEPARTMENT WITHIN ONE WEEK IF SPECIATION AND SENSITIVITIES ARE REQUIRED. Performed at [facility], [address], [place], [place] [address]    Report Status 11/18/2021 FINAL  Final  Blood Culture ID Panel (Reflexed)     Status: Abnormal   Collection Time: 11/15/21  6:10 PM  Result Value Ref Range Status   Enterococcus faecalis NOT DETECTED NOT DETECTED Final   Enterococcus Faecium NOT DETECTED NOT DETECTED Final   Listeria monocytogenes NOT DETECTED NOT DETECTED Final   Staphylococcus species DETECTED (A) NOT DETECTED Final    Comment: CRITICAL RESULT CALLED TO, READ BACK BY AND VERIFIED WITH: PHARMD ELLEN JACKSON 11/16/21@23 :52 BY TW    Staphylococcus aureus (BCID) NOT DETECTED NOT DETECTED Final   Staphylococcus epidermidis DETECTED (A) NOT DETECTED Final    Comment: CRITICAL RESULT CALLED TO, READ BACK BY AND VERIFIED WITH: PHARMD ELLEN JACKSON 11/16/21@23 :52 BY TW    Staphylococcus lugdunensis NOT DETECTED NOT DETECTED Final   Streptococcus species NOT DETECTED NOT DETECTED Final   Streptococcus agalactiae NOT DETECTED NOT DETECTED Final   Streptococcus pneumoniae NOT DETECTED NOT DETECTED Final   Streptococcus pyogenes NOT DETECTED NOT DETECTED Final   A.calcoaceticus-baumannii NOT DETECTED NOT DETECTED Final   Bacteroides fragilis NOT  DETECTED NOT DETECTED Final   Enterobacterales NOT DETECTED NOT DETECTED Final   Enterobacter cloacae complex NOT DETECTED NOT DETECTED Final   Escherichia coli NOT DETECTED NOT DETECTED Final   Klebsiella aerogenes NOT DETECTED NOT DETECTED Final   Klebsiella oxytoca NOT DETECTED NOT DETECTED Final   Klebsiella pneumoniae NOT DETECTED NOT DETECTED Final   Proteus species NOT DETECTED NOT DETECTED Final   Salmonella species NOT DETECTED NOT DETECTED Final   Serratia marcescens NOT DETECTED NOT DETECTED Final   Haemophilus influenzae NOT DETECTED NOT DETECTED Final   Neisseria meningitidis NOT DETECTED NOT DETECTED Final   Pseudomonas aeruginosa NOT DETECTED NOT DETECTED Final   Stenotrophomonas maltophilia NOT DETECTED NOT DETECTED Final   Candida albicans NOT DETECTED NOT DETECTED Final   Candida auris NOT DETECTED NOT DETECTED Final   Candida glabrata NOT DETECTED NOT DETECTED Final   Candida krusei NOT DETECTED NOT DETECTED Final   Candida parapsilosis NOT DETECTED NOT DETECTED Final   Candida tropicalis NOT DETECTED NOT DETECTED Final   Cryptococcus neoformans/gattii NOT DETECTED NOT DETECTED Final   Methicillin resistance mecA/C NOT DETECTED NOT DETECTED Final    Comment: Performed at [facility], [address] [place], [place] [address]  Surgical PCR screen     Status: Abnormal   Collection Time: 11/16/21  2:42 PM   Specimen: Nasal Mucosa; Nasal Swab  Result Value Ref Range Status   MRSA, PCR NEGATIVE NEGATIVE Final   Staphylococcus aureus POSITIVE (A) NEGATIVE Final  Comment: (NOTE) The Xpert SA Assay (FDA approved for NASAL specimens in patients 56 years of age and older), is one component of a comprehensive surveillance program. It is not intended to diagnose infection nor to guide or monitor treatment. Performed at [facility], [address] [place], [place] [address]     Radiology Studies: No results found.    Silvestre Mines T.  Camrynn Mcclintic [facility]  If 7PM-7AM, please contact night-coverage [email or web address] 11/20/2021, 4:38 PM

## 2021-11-20 NOTE — Progress Notes (Signed)
ANTICOAGULATION CONSULT NOTE  ? ?Pharmacy Consult for warfarin ?Indication: hx atrial fibrillation and pulmonary embolus ? ?Allergies  ?Allergen Reactions  ? Acetaminophen   ? Amoxicillin   ? Azithromycin   ?  Other reaction(s): unsure  ? Codeine   ?  Other reaction(s): unsure  ? Erythromycin   ? Erythromycin Ethylsuccinate   ? Green Dyes Other (See Comments)  ?  Allergic to ALL dyes  ? Iodine   ? Misc. Sulfonamide Containing Compounds   ? Oxycodone   ? Oxycodone-Acetaminophen   ? Penicillins   ? Sulfasalazine   ? Tramadol Hives  ? ? ?Patient Measurements: ?Height: 5\' 2"  (157.5 cm) ?Weight: 89.8 kg (197 lb 15.6 oz) ?IBW/kg (Calculated) : 50.1 ? ?Vital Signs: ?Temp: 98 ?F (36.7 ?C) (03/15 [id number]) ?Temp Source: Oral (03/15 [id number]) ?BP: 162/92 (03/15 0610) ?Pulse Rate: 61 (03/15 0610) ? ?Labs: ?Recent Labs  ?  11/18/21 ?0427 11/19/21 ?[id number] 11/20/21 ?[id number]  ?HGB 10.7* 11.0* 11.4*  ?HCT 33.2* 33.7* 34.7*  ?PLT 145* 165 181  ?LABPROT 25.1* 25.4* 27.6*  ?INR 2.3* 2.3* 2.6*  ?CREATININE 1.52* 1.31* 1.16*  ? ? ? ?Estimated Creatinine Clearance: 32.9 mL/min (A) (by C-G formula based on SCr of 1.16 mg/dL (H)). ? ? ?Medications:  ?- PTA warfarin regimen: 1mg  on M/W/F and 2mg  on Tues/Thurs/Sat/Sun (confirmed on 3/14 with [facility] office and patient's daughter) - last dose PTA taken on 3/9 ? ?Assessment: ?Patient is a 88 y.o F with hx PE and afib on warfarin PTA, presented to the ED on 11/15/2021 with c/o leg pain, right second toe ulcer and generalized weakness. Right foot MRI on 11/15/21 showed findings that's suspicious of right second toe osteomyelitis. She subsequently underwent right second toe amputation on 11/17/21. Pharmacy has been consulted to resume warfarin on 11/18/21. ? ?Today, 11/20/2021: ?- INR is therapeutic at 2.6, trending up ?- CBC stable ?- No bleeding documented ?- No major drug interactions noted ? ?Goal of Therapy:  ?INR 2-3 ?Monitor platelets by anticoagulation protocol: Yes ?  ?Plan:  ?- Warfarin 0.5  mg PO x 1 today (reduced dose given INR trend upward despite decreased doses from home regimen) ?- Daily PT/INR ?- Monitor CBC and for s/sx of bleeding ? ?Tawnya Crook, PharmD, BCPS ?Clinical Pharmacist ?11/20/2021 8:27 AM ? ? ? ?

## 2021-11-20 NOTE — Assessment & Plan Note (Addendum)
Recent Labs  ?  11/15/21 ?1743 11/16/21 ?0709 11/17/21 ?0602 11/18/21 ?0427 11/19/21 ?4332 11/20/21 ?9518 11/21/21 ?0532  ?BUN 24* 24* 32* 32* 25* 24* 20  ?CREATININE 1.29* 1.30* 1.94* 1.52* 1.31* 1.16* 1.24*  1.29*  ?AKI seems to have resolved. Decreased home losartan.  Holding Aldactone. ?-Recheck in 1 to 2 weeks ? ?

## 2021-11-21 LAB — VITAMIN B12: Vitamin B-12: 1514 pg/mL — ABNORMAL HIGH (ref 180–914)

## 2021-11-21 LAB — RENAL FUNCTION PANEL
Albumin: 2.8 g/dL — ABNORMAL LOW (ref 3.5–5.0)
Anion gap: 7 (ref 5–15)
BUN: 20 mg/dL (ref 8–23)
CO2: 27 mmol/L (ref 22–32)
Calcium: 8.9 mg/dL (ref 8.9–10.3)
Chloride: 93 mmol/L — ABNORMAL LOW (ref 98–111)
Creatinine, Ser: 1.24 mg/dL — ABNORMAL HIGH (ref 0.44–1.00)
GFR, Estimated: 41 mL/min — ABNORMAL LOW (ref >60)
Glucose, Bld: 99 mg/dL (ref 70–99)
Phosphorus: 2.4 mg/dL — ABNORMAL LOW (ref 2.5–4.6)
Potassium: 4.1 mmol/L (ref 3.5–5.1)
Sodium: 127 mmol/L — ABNORMAL LOW (ref 135–145)

## 2021-11-21 LAB — FOLATE: Folate: 8.1 ng/mL (ref >5.9)

## 2021-11-21 LAB — IRON AND TIBC
Iron: 40 ug/dL (ref 28–170)
Saturation Ratios: 17 % (ref 10.4–31.8)
TIBC: 238 ug/dL — ABNORMAL LOW (ref 250–450)
UIBC: 198 ug/dL

## 2021-11-21 LAB — CBC WITH DIFFERENTIAL/PLATELET
Abs Immature Granulocytes: 0.09 10*3/uL — ABNORMAL HIGH (ref 0.00–0.07)
Basophils Absolute: 0.1 10*3/uL (ref 0.0–0.1)
Basophils Relative: 1 %
Eosinophils Absolute: 0.4 10*3/uL (ref 0.0–0.5)
Eosinophils Relative: 6 %
HCT: 34.6 % — ABNORMAL LOW (ref 36.0–46.0)
Hemoglobin: 11.4 g/dL — ABNORMAL LOW (ref 12.0–15.0)
Immature Granulocytes: 2 %
Lymphocytes Relative: 14 %
Lymphs Abs: 0.9 10*3/uL (ref 0.7–4.0)
MCH: 30.6 pg (ref 26.0–34.0)
MCHC: 32.9 g/dL (ref 30.0–36.0)
MCV: 93 fL (ref 80.0–100.0)
Monocytes Absolute: 0.8 10*3/uL (ref 0.1–1.0)
Monocytes Relative: 13 %
Neutro Abs: 4 10*3/uL (ref 1.7–7.7)
Neutrophils Relative %: 64 %
Platelets: 195 10*3/uL (ref 150–400)
RBC: 3.72 MIL/uL — ABNORMAL LOW (ref 3.87–5.11)
RDW: 14.6 % (ref 11.5–15.5)
WBC: 6.2 10*3/uL (ref 4.0–10.5)
nRBC: 0 % (ref 0.0–0.2)

## 2021-11-21 LAB — FERRITIN: Ferritin: 108 ng/mL (ref 11–307)

## 2021-11-21 LAB — PROTIME-INR
INR: 2.2 — ABNORMAL HIGH (ref 0.8–1.2)
Prothrombin Time: 24 seconds — ABNORMAL HIGH (ref 11.4–15.2)

## 2021-11-21 LAB — RETICULOCYTES
Immature Retic Fract: 25.7 % — ABNORMAL HIGH (ref 2.3–15.9)
RBC.: 3.68 MIL/uL — ABNORMAL LOW (ref 3.87–5.11)
Retic Count, Absolute: 87.6 10*3/uL (ref 19.0–186.0)
Retic Ct Pct: 2.4 % (ref 0.4–3.1)

## 2021-11-21 LAB — MAGNESIUM: Magnesium: 1.7 mg/dL (ref 1.7–2.4)

## 2021-11-21 LAB — CREATININE, SERUM
Creatinine, Ser: 1.29 mg/dL — ABNORMAL HIGH (ref 0.44–1.00)
GFR, Estimated: 39 mL/min — ABNORMAL LOW (ref >60)

## 2021-11-21 MED ORDER — LIP MEDEX EX OINT: 1.0000 "application " | TOPICAL_OINTMENT | CUTANEOUS | Status: DC | PRN

## 2021-11-21 MED ORDER — LIP MEDEX EX OINT: TOPICAL_OINTMENT | CUTANEOUS | Status: DC | PRN

## 2021-11-21 MED ORDER — WARFARIN SODIUM 1 MG PO TABS
1.0000 mg | ORAL_TABLET | Freq: Once | ORAL | Status: AC
  Administered 2021-11-21: 1 mg via ORAL
  Filled 2021-11-21: qty 1

## 2021-11-21 MED ORDER — LOSARTAN POTASSIUM 50 MG PO TABS: 50.0000 mg | ORAL_TABLET | Freq: Every day | ORAL | 1 refills | Status: DC

## 2021-11-21 NOTE — Progress Notes (Signed)
Patient discharged to home with daughter. Discharge instructions reviewed extensively with patient and daughter who verbalized understanding. Three in one sent home with patient. ?

## 2021-11-21 NOTE — Progress Notes (Signed)
ANTICOAGULATION CONSULT NOTE  ? ?Pharmacy Consult for warfarin ?Indication: hx atrial fibrillation and pulmonary embolus ? ?Allergies  ?Allergen Reactions  ? Acetaminophen   ? Amoxicillin   ? Azithromycin   ?  Other reaction(s): unsure  ? Codeine   ?  Other reaction(s): unsure  ? Erythromycin   ? Erythromycin Ethylsuccinate   ? Green Dyes Other (See Comments)  ?  Allergic to ALL dyes  ? Iodine   ? Misc. Sulfonamide Containing Compounds   ? Oxycodone   ? Oxycodone-Acetaminophen   ? Penicillins   ? Sulfasalazine   ? Tramadol Hives  ? ? ?Patient Measurements: ?Height: 5\' 2"  (157.5 cm) ?Weight: 87.7 kg (193 lb 5.5 oz) ?IBW/kg (Calculated) : 50.1 ? ?Vital Signs: ?Temp: 98 ?F (36.7 ?C) (03/16 0511) ?Temp Source: Oral (03/16 0511) ?BP: 157/77 (03/16 0511) ?Pulse Rate: 71 (03/16 0511) ? ?Labs: ?Recent Labs  ?  11/19/21 ?11/21/21 11/20/21 ?11/22/21 11/21/21 ?0532  ?HGB 11.0* 11.4* 11.4*  ?HCT 33.7* 34.7* 34.6*  ?PLT 165 181 195  ?LABPROT 25.4* 27.6* 24.0*  ?INR 2.3* 2.6* 2.2*  ?CREATININE 1.31* 1.16* 1.24*  1.29*  ? ? ? ?Estimated Creatinine Clearance: 29.2 mL/min (A) (by C-G formula based on SCr of 1.29 mg/dL (H)). ? ? ?Medications:  ?- PTA warfarin regimen: 1mg  on M/W/F and 2mg  on Tues/Thurs/Sat/Sun (confirmed on 3/14 with [facility] office and patient's daughter) - last dose PTA taken on 3/9 ? ?Assessment: ?Patient is a 88 y.o F with hx PE and afib on warfarin PTA, presented to the ED on 11/15/2021 with c/o leg pain, right second toe ulcer and generalized weakness. Right foot MRI on 11/15/21 showed findings that's suspicious of right second toe osteomyelitis. She subsequently underwent right second toe amputation on 11/17/21. Pharmacy has been consulted to resume warfarin on 11/18/21. ? ?Today, 11/21/2021: ?- INR is therapeutic at 2.2 (down from 2.6 after dose reduced to 0.5 mg on 3/15) ?- CBC stable ?- No bleeding documented ?- No major drug interactions noted ? ?Goal of Therapy:  ?INR 2-3 ?Monitor platelets by  anticoagulation protocol: Yes ?  ?Plan:  ?- Warfarin 1 mg PO x1 at 4p if she is not discharged today ?- Daily PT/INR ?- Monitor CBC and for s/sx of bleeding ? ?11/20/21, PharmD, BCPS ?Clinical Pharmacist ?11/21/2021 9:20 AM ? ? ? ?

## 2021-11-21 NOTE — Discharge Summary (Signed)
? ?Physician Discharge Summary  ?[name] JSH:[id number] DOB: September 11, 1927 DOA: 11/15/2021 ? ?PCP: [name], MD ? ?Admit date: 11/15/2021 ?Discharge date: 11/21/2021 ?Admitted From: Home ?Disposition: Home ?Recommendations for Outpatient Follow-up:  ?Follow ups as below. ?Please obtain CBC/BMP/Mag at follow up ?Reassess blood pressure and fluid status at follow-up. ?Adjust antihypertensive meds and diuretics as appropriate ?Please follow up on the following pending results: None ? ?Home Health: PT/OT/RN ?Equipment/Devices: 3 in 1 commode ? ?Discharge Condition: Stable ?CODE STATUS: DNR/DNI ? Follow-up Information   ? ? Megan Arthurs, MD Follow up in 2 week(s).   ?Specialty: Orthopedic Surgery ?Contact information: ?[address] ?[address] ?[place] [address] ?[phone] ? ? ?  ?  ? ? [facility] Follow up.   ?Specialty: Home Health Services ?Why: HH nursing/physical therapy/occupational therapy ?Contact information: ?[address] ?[address] ?[place] [address] ?[phone] ? ? ?  ?  ? ? [name], MD. Schedule an appointment as soon as possible for a visit in 1 week(s).   ?Specialty: Family Medicine ?Contact information: ?[address] ?[address] ?[place] [address] ?[phone] ? ? ?  ?  ? ? [name], MD .   ?Specialty: Cardiology ?Contact information: ?[address] ?[address] ?[place] [address] ?[phone] ? ? ?  ?  ? ?  ?  ? ?  ? ? ?Hospital course ?88 y.o. female with medical history significant of A-fib, CHF, GERD, hypertension, pulmonary embolism, thyroid disease, COPD, pulmonary hypertension, anxiety, hyperlipidemia, venous insufficiency presented with leg wound and pain and generalized weakness.  On presentation, right foot x-ray showed concern for possible osteomyelitis and recommended MRI.  Patient was started on broad-spectrum antibiotics.  Orthopedic surgery consulted.  She underwent right second toe amputation on 11/25/2021 with source  control.  Antibiotics discontinued.  ? ?Hospital course complicated by hyponatremia, AKI and uncontrolled hypertension, that has improved with adjustment of medications.  Patient to hold Aldactone for 5 days.  Decreased losartan to 50 mg daily.  She will continue home Bumex at 1 mg twice daily.  Recommend rechecking renal functions, electrolytes and blood pressure, and adjusting medications as appropriate. ? ?Patient has been cleared for discharge by orthopedic surgery for outpatient follow-up.  ?Home health and DME ordered as recommended by therapy.  ? ?See individual problem list below for more on hospital course. ? ?Problems addressed during this hospitalization ?Problem  ?Osteomyelitis of Second Toe of Right Foot (Hcc)  ?AKI on CKD-3B  ?Chronic Diastolic Chf (Congestive Heart Failure) (Hcc)  ?Hyponatremia  ?Uncontrolled Hypertension  ?Permanent Atrial Fibrillation (Hcc)  ?Positive Blood Culture  ?Generalized Weakness  ?  ?Assessment and Plan: ?* Osteomyelitis of second toe of right foot (HCC) ?S/p right second toe amputation with infection source control on 11/15/2021. ?Broad-spectrum antibiotics discontinued ?NWB on right foot and no dressing change until follow-up with orthopedic surgery. ? ?AKI on CKD-3B ?Recent Labs  ?  11/15/21 ?1743 11/16/21 ?0709 11/17/21 ?0602 11/18/21 ?0427 11/19/21 ?9675 11/20/21 ?9163 11/21/21 ?0532  ?BUN 24* 24* 32* 32* 25* 24* 20  ?CREATININE 1.29* 1.30* 1.94* 1.52* 1.31* 1.16* 1.24*  1.29*  ?AKI seems to have resolved. Decreased home losartan.  Holding Aldactone. ?-Recheck in 1 to 2 weeks ? ? ?Hyponatremia ?Recent Labs  ?Lab 11/15/21 ?1743 11/16/21 ?0709 11/17/21 ?0602 11/18/21 ?0427 11/19/21 ?8466 11/20/21 ?5993  ?NA 130* 130* 126* 125* 124* 128*  ?Improved.  Continue Bumex.  Continue holding Aldactone.  Continue fluid restriction.  Recheck in the morning ? ? ?Chronic  diastolic CHF (congestive heart failure) (HCC) ?Last TTE in 2019 in [place] showed EF > 70% with severe P  HTN and severe TVR.  She has chronic venous insufficiency.  She is on Bumex and Aldactone at home.  Diuresed with IV Lasix and transition to home Bumex 1 mg twice daily. ?-Continue home Bumex 1 mg twice daily ?-Continue holding Aldactone in the setting of hyponatremia ?-Reassess fluid status, renal functions and electrolytes at follow-up. ? ?Uncontrolled hypertension ?Likely from anxiety and holding home antihypertensive meds.  Improved after resuming losartan at lower dose. ?-Continue losartan at 50 mg daily in the setting of hyponatremia.  Was on 100 mg daily at home ?-Continue Toprol-XL 50 mg daily ?-Continue Bumex 1 mg twice daily ?-Hold Aldactone for 5 more days given hyponatremia ?-Reassess blood pressure, renal functions and electrolytes, and adjust meds as appropriate ? ?Permanent atrial fibrillation (HCC) ?Continue Toprol-XL and home warfarin.  INR therapeutic. ? ?Hypothyroidism ?- Continue Synthroid ? ?Gastroesophageal reflux disease ?- Continue PPI ? ?Generalized weakness ?PT/OT recommended home health and DME ? ?Positive blood culture ?Blood culture with Staph epidermidis in 1 out of 2 bottles likely contaminant.  Bacteremia ruled out. ? ?Leukocytosis ?-resolved ? ? ? ? ?  ?  ?  ?  ? ?  ? ?Vital signs ?Vitals:  ? 11/20/21 1837 11/20/21 2053 11/20/21 2316 11/21/21 0511  ?BP: (!) 178/63 ?Comment: MD aware (!) 181/59 (!) 146/76 (!) 157/77  ?Pulse: (!) 59 69 64 71  ?Temp:  98.6 ?F (37 ?C)  98 ?F (36.7 ?C)  ?Resp:  14  14  ?Height:      ?Weight:    87.7 kg  ?SpO2: 97% 95%  95%  ?TempSrc:  Oral  Oral  ?BMI (Calculated):    35.35  ?  ? ?Discharge exam ? ?GENERAL: No apparent distress.  Nontoxic. ?HEENT: MMM.  Vision and hearing grossly intact.  ?NECK: Supple.  No apparent JVD.  ?RESP: On RA.  No IWOB.  Fair aeration bilaterally. ?CVS: Irregular rhythm.  Normal rate.  3/6 SEM over LLSB. ?ABD/GI/GU: BS+. Abd soft, NTND.  ?MSK/EXT:  Moves extremities.  Dressing over right foot DCI.  Small residual petechiae  proximally ?SKIN: As above. ?NEURO: Sleepy but wakes to voice.  Oriented x4.  No apparent focal neuro deficit. ?PSYCH: Calm. Normal affect.  ? ?Discharge Instructions ?Discharge Instructions   ? ? (HEART FAILURE PATIENTS) Call MD:  Anytime you have any of the following symptoms: 1) 3 pound weight gain in 24 hours or 5 pounds in 1 week 2) shortness of breath, with or without a dry hacking cough 3) swelling in the hands, feet or stomach 4) if you have to sleep on extra pillows at night in order to breathe.   Complete by: As directed ?  ? Call MD for:  difficulty breathing, headache or visual disturbances   Complete by: As directed ?  ? Call MD for:  extreme fatigue   Complete by: As directed ?  ? Call MD for:  redness, tenderness, or signs of infection (pain, swelling, redness, odor or green/yellow discharge around incision site)   Complete by: As directed ?  ? Call MD for:  severe uncontrolled pain   Complete by: As directed ?  ? Call MD for:  temperature >100.4   Complete by: As directed ?  ? Diet - low sodium heart healthy   Complete by: As directed ?  ? Discharge instructions   Complete by: As directed ?  ? It  has been a pleasure taking care of you! ? ?You were hospitalized due to right foot infection for which you have been treated surgically medically.  There are some changes to your medication during this hospitalization.  Please review your new medication list and the directions on your medications before you take them.  Follow instruction by the surgeon about wound care and follow-up.  We also recommend follow-up with your primary care doctor and cardiologist in 1 to 2 weeks.  ? ?In addition to taking your medications as prescribed, we also recommend you avoid alcohol or over-the-counter pain medication other than plain Tylenol, limit the amount of water/fluid you drink to less than 6 cups (1500 cc) a day,  limit your sodium (salt) intake to less than 2 g (2000 mg) a day and weigh yourself daily at the same  time and keeping your weight log.   ? ? ?Take care,  ? Increase activity slowly   Complete by: As directed ?  ? No wound care   Complete by: As directed ?  ? Follow-up instruction by the surgeon  ? ?  ? ?Allergie

## 2021-12-02 ENCOUNTER — Other Ambulatory Visit: Payer: Self-pay

## 2021-12-16 NOTE — Progress Notes (Signed)
? ?Office Visit  ?  ?Patient Name: [name] ?Date of Encounter: 12/17/2021 ? ?PCP:  [name], MD ?  ?[place]  ?Cardiologist:  Buford Dresser, MD  ?Advanced Practice Provider:  No care team member to display ?Electrophysiologist:  None  ? ? ?Chief Complaint  ?  ?[name] is a 88 y.o. female with a hx of  atrial fibrillation, HTN, pulmonary hypertension, chronic diastolic heart failure presents today for hospital follow up.   ? ?Past Medical History  ?  ?Past Medical History:  ?Diagnosis Date  ? Anticoagulant long-term use   ? Arrhythmia   ? Arthritis   ? Atrial fibrillation ([place])   ? Congestive heart failure (CHF) ([place])   ? Dysphagia   ? Esophageal reflux   ? Essential hypertension   ? Hiatal hernia   ? Pulmonary embolus ([place])   ? Thyroid disease   ? ?Past Surgical History:  ?Procedure Laterality Date  ? AMPUTATION TOE Right 11/17/2021  ? Procedure: RIGHT SECOND TOE AMPUTATION;  Surgeon: Wylene Simmer, MD;  Location: [facility];  Service: Orthopedics;  Laterality: Right;  ? COLONOSCOPY    ? ELBOW SURGERY    ? LAPAROSCOPIC HYSTERECTOMY    ? ? ?Allergies ? ?Allergies  ?Allergen Reactions  ? Acetaminophen   ? Amoxicillin   ? Azithromycin   ?  Other reaction(s): unsure  ? Codeine   ?  Other reaction(s): unsure  ? Erythromycin   ? Erythromycin Ethylsuccinate   ? Green Dyes Other (See Comments)  ?  Allergic to ALL dyes  ? Iodine   ? Misc. Sulfonamide Containing Compounds   ? Oxycodone   ? Oxycodone-Acetaminophen   ? Penicillins   ? Sulfasalazine   ? Tramadol Hives  ? ? ?History of Present Illness  ?  ?[name] is a 88 y.o. female with a hx of atrial fibrillation, HTN, pulmonary hypertension, chronic diastolic heart failure last seen 02/05/21. ? ?Previously followed by Dr. Tish Men at [facility]. 2015 myoview low risk study. Echo 219 hyperdynamic EF, severe RV dilation, pulmonary hypertension,. Venous study 2019 with mild insufficiency. She established with Dr.  Harrell Gave in 2020.  ? ?Admitted 3/10-3/16/23 with osteomyelitis of right foot and had right second toe amputation 11/25/21. Hospital course complicated by hyponatremia, AKI, uncontrolled hypertension. Her Aldactone was held for 5 days, Losartan reduced to 50mg  QD, and Bumex 1mg  BID continued. ? ?She presents today for follow up with her daughter.  Since discharge notes no chest pain, shortness of breath.  Has had some increased lower extremity edema to the right leg.  She does elevate and wear compression stockings.  She has been released by the surgeon to follow-up as needed.  Has home health resuming today.  Blood pressure when checked at office visits is routinely been less than [id number] systolic. ? ?EKGs/Labs/Other Studies Reviewed:  ? ?The following studies were reviewed today: ? ?EKG: No EKG today ? ?Recent Labs: ?11/16/2021: ALT 18 ?11/21/2021: BUN 20; Creatinine, Ser 1.29; Creatinine, Ser 1.24; Hemoglobin 11.4; Magnesium 1.7; Platelets 195; Potassium 4.1; Sodium 127  ?Recent Lipid Panel ?No results found for: CHOL, TRIG, HDL, CHOLHDL, VLDL, LDLCALC, LDLDIRECT ? ?Risk Assessment/Calculations:  ? ?CHA2DS2-VASc Score = 5  ? This indicates a 7.2% annual risk of stroke. ?The patient's score is based upon: ?CHF History: 1 ?HTN History: 1 ?Diabetes History: 0 ?Stroke History: 0 ?Vascular Disease History: 0 ?Age Score: 2 ?Gender Score: 1 ?  ?Home Medications  ? ?Current  Meds  ?Medication Sig  ? acetaminophen (TYLENOL) 325 MG tablet Take 650 mg by mouth every 6 (six) hours as needed for moderate pain.  ? Apoaequorin (PREVAGEN PO) Take 1 capsule by mouth at bedtime.  ? bumetanide (BUMEX) 1 MG tablet Take 1 tablet (1 mg total) by mouth 2 (two) times daily for 5 days, THEN 1 tablet (1 mg total) daily for 25 days.  ? carboxymethylcellulose (REFRESH PLUS) 0.5 % SOLN 1 drop in the morning and at bedtime.  ? cholecalciferol (VITAMIN D3) 25 MCG (1000 UT) tablet Take 1,000 Units by mouth daily.  ? clotrimazole-betamethasone  (LOTRISONE) cream Apply 1 application. topically 2 (two) times daily as needed (rash/irritation).  ? diclofenac Sodium (VOLTAREN) 1 % GEL Apply 2 g topically 4 (four) times daily as needed (pain).  ? gabapentin (NEURONTIN) 300 MG capsule Take 300 mg by mouth 2 (two) times daily.   ? levothyroxine (SYNTHROID) 100 MCG tablet Take 100 mcg by mouth every morning.  ? losartan (COZAAR) 50 MG tablet Take 1 tablet (50 mg total) by mouth daily.  ? Menthol, Topical Analgesic, (BIOFREEZE) 10 % CREA Apply 1 application. topically daily as needed (pain).  ? metoprolol succinate (TOPROL-XL) 50 MG 24 hr tablet TAKE 1 TABLET BY MOUTH DAILY. TAKE WITH OR IMMEDIATELY FOLLOWING A MEAL.  ? Multiple Vitamins-Minerals (PRESERVISION/LUTEIN) CAPS Take 1 capsule by mouth 2 (two) times daily.   ? pantoprazole (PROTONIX) 40 MG tablet Take 40 mg by mouth daily.  ? senna-docusate (SENOKOT-S) 8.6-50 MG tablet Take 1 tablet by mouth 2 (two) times daily between meals as needed for moderate constipation or mild constipation.  ? spironolactone (ALDACTONE) 25 MG tablet Take 1 tablet (25 mg total) by mouth daily with breakfast.  ? vitamin B-12 (CYANOCOBALAMIN) 1000 MCG tablet Take 1,000 mcg by mouth daily.  ? warfarin (COUMADIN) 1 MG tablet Take 1-2 mg by mouth See admin instructions. Take 1mg  by mouth on Mondays, Wednesdays, and Fridays. Then take two of the 1 mg tablets total of 2 mg by mouth on Tuesdays, Thursdays, Saturdays, and Sundays  ?  ?Review of Systems  ?    ?All other systems reviewed and are otherwise negative except as noted above. ? ?Physical Exam  ?  ?VS:  BP (!) 144/62 (BP Location: Left Arm, Patient Position: Sitting, Cuff Size: Large)   Pulse 65   Ht 5\' 2"  (1.575 m)   Wt 184 lb (83.5 kg)   SpO2 92%   BMI 33.65 kg/m?  , BMI Body mass index is 33.65 kg/m?. ? ?Wt Readings from Last 3 Encounters:  ?12/17/21 184 lb (83.5 kg)  ?11/21/21 193 lb 5.5 oz (87.7 kg)  ?02/05/21 190 lb (86.2 kg)  ?  ?GEN: Well nourished, well developed, in  no acute distress. ?HEENT: normal. ?Neck: Supple, no JVD, carotid bruits, or masses. ?Cardiac: IRIR, no murmurs, rubs, or gallops. No clubbing, cyanosis.  Radials/PT 2+ and equal bilaterally.  ?Respiratory:  Respirations regular and unlabored, clear to auscultation bilaterally. ?GI: Soft, nontender, nondistended. ?MS: No deformity or atrophy. ?Skin: Warm and dry, no rash.  Bilateral lower extremity compression stockings.  RLE with nonpitting edema. ?Neuro:  Strength and sensation are intact. ?Psych: Normal affect. ? ?Assessment & Plan  ?  ?Permanent atrial fibrillation / Hypercoagulable state - Rate controlled. Continue Toprol 50mg  QD. Continue Coumadin which is managed by PCP.  ? ?Diastolic heart failure-continue Bumex 1 mg daily, spironolactone 5 mg daily. Low sodium diet, fluid restriction <2L, and daily weights encouraged. Educated to  contact our office for weight gain of 2 lbs overnight or 5 lbs in one week.  ? ?HTN -BP reasonably well controlled today and well controlled at recent office visit.  She will contact our office if BP consistently greater than 140/90. ? ?PAH -continue current dose diuretic. ? ?LE edema -continue Bumex 1 mg twice daily, spironolactone 25 mg daily.  Encouraged to continue compression stockings elevating the leg when sitting.  May take additional 1 mg Bumex in the afternoon for weight gain 3 pounds overnight, 5 pounds in 1 week or lower extremity edema. ? ?Disposition: Follow up in 2 month(s) with Buford Dresser, MD or APP. ? ?Signed, ?Loel Dubonnet, NP ?12/17/2021, 3:32 PM ?[place] ?

## 2021-12-17 ENCOUNTER — Encounter (HOSPITAL_BASED_OUTPATIENT_CLINIC_OR_DEPARTMENT_OTHER): Payer: Self-pay | Admitting: Family

## 2021-12-17 ENCOUNTER — Ambulatory Visit (INDEPENDENT_AMBULATORY_CARE_PROVIDER_SITE_OTHER): Payer: Medicare HMO | Admitting: Family

## 2021-12-17 VITALS — BP 144/62 | HR 65 | Ht 62.0 in | Wt 184.0 lb

## 2021-12-17 DIAGNOSIS — I10 Essential (primary) hypertension: Secondary | ICD-10-CM

## 2021-12-17 DIAGNOSIS — I4821 Permanent atrial fibrillation: Secondary | ICD-10-CM | POA: Diagnosis not present

## 2021-12-17 DIAGNOSIS — I272 Pulmonary hypertension, unspecified: Secondary | ICD-10-CM | POA: Diagnosis not present

## 2021-12-17 DIAGNOSIS — I5032 Chronic diastolic (congestive) heart failure: Secondary | ICD-10-CM

## 2021-12-17 DIAGNOSIS — R600 Localized edema: Secondary | ICD-10-CM

## 2021-12-17 MED ORDER — LOSARTAN POTASSIUM 50 MG PO TABS: 50.0000 mg | ORAL_TABLET | Freq: Every day | ORAL | 1 refills | Status: DC

## 2021-12-17 MED ORDER — BUMETANIDE 1 MG PO TABS: 1.0000 mg | ORAL_TABLET | Freq: Every day | ORAL | 1 refills | Status: DC

## 2021-12-17 MED ORDER — SPIRONOLACTONE 25 MG PO TABS: 25.0000 mg | ORAL_TABLET | Freq: Every day | ORAL | 1 refills | Status: DC

## 2021-12-17 NOTE — Patient Instructions (Addendum)
Medication Instructions:  ?Your physician has recommended you make the following change in your medication:   ? ?CONTINUE Bumex one tablet daily ? ?*may take an additional tablet in the afternoon as needed for swelling or if weight goes up 3 pounds overnight or 5 pounds in one week ? ?*if you are having to take an extra dose more than once per week, please call and let us know ? ?CONTINUE Losartan 50mg  daily  ? ?*If blood pressure is consistently more than 140/90, please call and we will consider adjusting the dose ? ?*If you need a refill on your cardiac medications before your next appointment, please call your pharmacy* ? ?Lab Work: ?None ordered today.  ? ?Testing/Procedures: ?None ordered today.  ? ?Follow-Up: ?At [facility], you and your health needs are our priority.  As part of our continuing mission to provide you with exceptional heart care, we have created designated Provider Care Teams.  These Care Teams include your primary Cardiologist (physician) and Advanced Practice Providers (APPs -  Physician Assistants and Nurse Practitioners) who all work together to provide you with the care you need, when you need it. ? ?We recommend signing up for the patient portal called "MyChart".  Sign up information is provided on this After Visit Summary.  MyChart is used to connect with patients for Virtual Visits (Telemedicine).  Patients are able to view lab/test results, encounter notes, upcoming appointments, etc.  Non-urgent messages can be sent to your provider as well.   ?To learn more about what you can do with MyChart, go to [facility].   ? ?Your next appointment:   ?As scheduled in June with Dr. July ? ?Other Instructions ? ?Our goal is for your blood pressure to be less than 140/90. If blood pressure at home consistently less than 140/90, please let Cristal Deer know. Recommend checking blood pressure 2-3 times per week.  ? ?Tips to Measure your Blood Pressure Correctly ?Here's what you can  do to ensure a correct reading: ? Don't drink a caffeinated beverage or smoke during the 30 minutes before the test. ? Sit quietly for five minutes before the test begins. ? During the measurement, sit in a chair with your feet on the floor and your arm supported so your elbow is at about heart level. ? The inflatable part of the cuff should completely cover at least 80% of your upper arm, and the cuff should be placed on bare skin, not over a shirt. ? Don't talk during the measurement. ? ?Blood Pressure Log ? ? ?Date ?  ?Time  ?Blood Pressure  ?Example: Nov 1 9 AM 124/78  ? ?    ? ?    ? ?    ? ?    ? ?    ? ?    ? ?    ? ?    ? ? ?  ?Important Information About Sugar ? ? ? ? ?  ?

## 2021-12-18 ENCOUNTER — Telehealth: Payer: Self-pay | Admitting: Family

## 2021-12-18 DIAGNOSIS — I5032 Chronic diastolic (congestive) heart failure: Secondary | ICD-10-CM

## 2021-12-18 DIAGNOSIS — I10 Essential (primary) hypertension: Secondary | ICD-10-CM

## 2021-12-18 NOTE — Telephone Encounter (Signed)
?  Pt c/o BP issue: STAT if pt c/o blurred vision, one-sided weakness or slurred speech ? ?1. What are your last 5 BP readings?  ?Yesterday 172/76, 9:55 pm 161/62 71, 10:01 pm 157/63 66  ?Today morning 12:05 108/52 63, 4 pm 120/51 64 ? ?2. Are you having any other symptoms (ex. Dizziness, headache, blurred vision, passed out)? None ? ?3. What is your BP issue? Pt's daughter calling and provided pt's BP reading. She wanted to know if pt's losartan dosage needs to change ?

## 2021-12-19 MED ORDER — LOSARTAN POTASSIUM 100 MG PO TABS: 100.0000 mg | ORAL_TABLET | Freq: Every day | ORAL | 3 refills | Status: DC

## 2021-12-19 NOTE — Telephone Encounter (Signed)
Recommend increase Losartan to 100mg  daily with repeat BMP in 1-2 weeks. Report if BP consistently >130/80 or <110/60. ? ?Loel Dubonnet, NP  ?

## 2021-12-19 NOTE — Telephone Encounter (Signed)
RN called recommendation to daughter (ok per DPR) and left on VM (ok per DPR) with instructions to call back for any questions. New prescription sent to pharmacy on file. Lab slips mailed to patient.  ? ? ?"Recommend increase Losartan to 100mg  daily with repeat BMP in 1-2 weeks. Report if BP consistently >130/80 or <110/60. ?  ? , NP " ?

## 2022-01-09 ENCOUNTER — Telehealth: Payer: Self-pay | Admitting: Cardiology

## 2022-01-09 NOTE — Telephone Encounter (Signed)
FYI

## 2022-01-09 NOTE — Telephone Encounter (Signed)
Patient's daughter called wanting to let Dr. Cristal Deer know that her mother is having lab work done at her PCP on May the 9th she will have results of them sent to [place]. She will have the BMET done there, so her mother doesn't have to get stuck twice.  ?

## 2022-01-22 NOTE — Telephone Encounter (Signed)
Labs received and provided to MD for review. ? ? ?

## 2022-01-28 ENCOUNTER — Encounter (HOSPITAL_BASED_OUTPATIENT_CLINIC_OR_DEPARTMENT_OTHER): Payer: Medicare HMO | Attending: Internal Medicine | Admitting: Internal Medicine

## 2022-01-28 DIAGNOSIS — L97512 Non-pressure chronic ulcer of other part of right foot with fat layer exposed: Secondary | ICD-10-CM | POA: Diagnosis present

## 2022-01-28 DIAGNOSIS — I4891 Unspecified atrial fibrillation: Secondary | ICD-10-CM | POA: Insufficient documentation

## 2022-01-28 DIAGNOSIS — M869 Osteomyelitis, unspecified: Secondary | ICD-10-CM | POA: Diagnosis not present

## 2022-01-28 DIAGNOSIS — I890 Lymphedema, not elsewhere classified: Secondary | ICD-10-CM | POA: Insufficient documentation

## 2022-01-28 DIAGNOSIS — L97812 Non-pressure chronic ulcer of other part of right lower leg with fat layer exposed: Secondary | ICD-10-CM | POA: Diagnosis not present

## 2022-01-28 DIAGNOSIS — L97822 Non-pressure chronic ulcer of other part of left lower leg with fat layer exposed: Secondary | ICD-10-CM | POA: Insufficient documentation

## 2022-01-28 DIAGNOSIS — Z7901 Long term (current) use of anticoagulants: Secondary | ICD-10-CM | POA: Insufficient documentation

## 2022-01-28 DIAGNOSIS — T8781 Dehiscence of amputation stump: Secondary | ICD-10-CM | POA: Insufficient documentation

## 2022-01-28 DIAGNOSIS — Z89421 Acquired absence of other right toe(s): Secondary | ICD-10-CM | POA: Diagnosis not present

## 2022-01-28 DIAGNOSIS — I872 Venous insufficiency (chronic) (peripheral): Secondary | ICD-10-CM | POA: Diagnosis not present

## 2022-01-28 DIAGNOSIS — G9009 Other idiopathic peripheral autonomic neuropathy: Secondary | ICD-10-CM | POA: Diagnosis not present

## 2022-01-28 DIAGNOSIS — G609 Hereditary and idiopathic neuropathy, unspecified: Secondary | ICD-10-CM | POA: Diagnosis not present

## 2022-01-28 DIAGNOSIS — J449 Chronic obstructive pulmonary disease, unspecified: Secondary | ICD-10-CM | POA: Insufficient documentation

## 2022-01-28 NOTE — Progress Notes (Signed)
[name], [name] ([id number]) Visit Report for 01/28/2022 Abuse Risk Screen Details Patient Name: Date of Service: [name]. 01/28/2022 9:00 A M Medical Record Number: [id number] Patient Account Number: [id number] Date of Birth/Sex: Treating RN: [birth year] (88 y.o. [name], [name] Primary Care Lennell Shanks: [name] Other Clinician: Referring Monnie Gudgel: Treating Synia Douglass/Extender: [name] in Treatment: 0 Abuse Risk Screen Items Answer ABUSE RISK SCREEN: Has anyone close to you tried to hurt or harm you recentlyo No Do you feel uncomfortable with anyone in your familyo No Has anyone forced you do things that you didnt want to doo No Electronic Signature(s) Signed: 01/28/2022 4:34:51 PM By: Shawn Stall RN, BSN Entered By: Shawn Stall on 01/28/2022 09:51:29 -------------------------------------------------------------------------------- Activities of Daily Living Details Patient Name: Date of Service: [name] 01/28/2022 9:00 A M Medical Record Number: [id number] Patient Account Number: [id number] Date of Birth/Sex: Treating RN: 1927-10-18 (88 y.o. [name], [name] Primary Care Darin Arndt: [name] Other Clinician: Referring Aasiyah Auerbach: Treating Binta Statzer/Extender: [name] in Treatment: 0 Activities of Daily Living Items Answer Activities of Daily Living (Please select one for each item) Drive Automobile Not Able T Medications ake Completely Able Use T elephone Completely Able Care for Appearance Need Assistance Use T oilet Need Assistance Bath / Shower Need Assistance Dress Self Need Assistance Feed Self Completely Able Walk Completely Able Get In / Out Bed Need Assistance Housework Not Able Prepare Meals Not Able Handle Money Not Able Shop for Self Not Able Electronic Signature(s) Signed: 01/28/2022 4:34:51 PM By: Shawn Stall RN, BSN Entered By: Shawn Stall on 01/28/2022  09:51:55 -------------------------------------------------------------------------------- Education Screening Details Patient Name: Date of Service: [name]. 01/28/2022 9:00 A M Medical Record Number: [id number] Patient Account Number: [id number] Date of Birth/Sex: Treating RN: 1928-03-17 (89 y.o. [name], [name] Primary Care [name]: [name] Other Clinician: Referring [name]: Treating [name]/Extender: [name] in Treatment: 0 Primary Learner Assessed: Caregiver daughter Learning Preferences/Education Level/Primary Language Learning Preference: Explanation, Demonstration, Printed Material Highest Education Level: College or Above Preferred Language: English Cognitive Barrier Language Barrier: No Translator Needed: No Memory Deficit: No Emotional Barrier: No Cultural/Religious Beliefs Affecting Medical Care: No Physical Barrier Impaired Vision: Yes Glasses Impaired Hearing: No Decreased Hand dexterity: No Knowledge/Comprehension Knowledge Level: High Comprehension Level: High Ability to understand written instructions: High Ability to understand verbal instructions: High Motivation Anxiety Level: Calm Cooperation: Cooperative Education Importance: Acknowledges Need Interest in Health Problems: Asks Questions Perception: Coherent Willingness to Engage in Self-Management High Activities: Readiness to Engage in Self-Management High Activities: Notes per daughter patient at times forgetful. Daughter provides care. Electronic Signature(s) Signed: 01/28/2022 4:34:51 PM By: Shawn Stall RN, BSN Entered By: Shawn Stall on 01/28/2022 09:52:42 -------------------------------------------------------------------------------- Fall Risk Assessment Details Patient Name: Date of Service: [name]. 01/28/2022 9:00 A M Medical Record Number: [id number] Patient Account Number: [id number] Date of Birth/Sex: Treating RN: April 11, 1928  (88 y.o. [name], [name] Primary Care [name]: [name] Other Clinician: Referring [name]: Treating [name]/Extender: [name] in Treatment: 0 Fall Risk Assessment Items Have you had 2 or more falls in the last 12 monthso 0 Yes Have you had any fall that resulted in injury in the last 12 monthso 0 No FALLS RISK SCREEN History of falling - immediate or within 3 months 0 No Secondary diagnosis (Do you have 2 or more medical diagnoseso) 0 No Ambulatory aid None/bed rest/wheelchair/nurse 0 Yes Crutches/cane/walker 15 Yes Furniture 0 No  Intravenous therapy Access/Saline/Heparin Lock 0 No Gait/Transferring Normal/ bed rest/ wheelchair 0 No Weak (short steps with or without shuffle, stooped but able to lift head while walking, may seek 10 Yes support from furniture) Impaired (short steps with shuffle, may have difficulty arising from chair, head down, impaired 0 No balance) Mental Status Oriented to own ability 0 No Electronic Signature(s) Signed: 01/28/2022 4:34:51 PM By: Shawn Stall RN, BSN Entered By: Shawn Stall on 01/28/2022 09:53:02 -------------------------------------------------------------------------------- Foot Assessment Details Patient Name: Date of Service: [name]. 01/28/2022 9:00 A M Medical Record Number: [id number] Patient Account Number: [id number] Date of Birth/Sex: Treating RN: 10-Oct-1927 (88 y.o. [name] Primary Care Sehaj Mcenroe: [name] Other Clinician: Referring [name]: Treating Leotha Voeltz/Extender: [name] Weeks in Treatment: 0 Foot Assessment Items Site Locations + = Sensation present, - = Sensation absent, C = Callus, U = Ulcer R = Redness, W = Warmth, M = Maceration, PU = Pre-ulcerative lesion F = Fissure, S = Swelling, D = Dryness Assessment Right: Left: Other Deformity: No No Prior Foot Ulcer: No No Prior Amputation: No No Charcot Joint: No  No Ambulatory Status: Ambulatory With Help Assistance Device: Wheelchair Gait: Surveyor, mining) Signed: 01/28/2022 4:34:51 PM By: Shawn Stall RN, BSN Entered By: Shawn Stall on 01/28/2022 10:23:53 -------------------------------------------------------------------------------- Nutrition Risk Screening Details Patient Name: Date of Service: [name]. 01/28/2022 9:00 A M Medical Record Number: [id number] Patient Account Number: [id number] Date of Birth/Sex: Treating RN: 01-17-1928 (88 y.o. [name], [name] Primary Care Leland Staszewski: [name] Other Clinician: Referring Emarie Paul: Treating Tymara Saur/Extender: [name] Weeks in Treatment: 0 Height (in): 62 Weight (lbs): 181 Body Mass Index (BMI): 33.1 Nutrition Risk Screening Items Score Screening NUTRITION RISK SCREEN: I have an illness or condition that made me change the kind and/or amount of food I eat 2 Yes I eat fewer than two meals per day 0 No I eat few fruits and vegetables, or milk products 0 No I have three or more drinks of beer, liquor or wine almost every day 0 No I have tooth or mouth problems that make it hard for me to eat 0 No I don't always have enough money to buy the food I need 0 No I eat alone most of the time 0 No I take three or more different prescribed or over-the-counter drugs a day 1 Yes Without wanting to, I have lost or gained 10 pounds in the last six months 0 No I am not always physically able to shop, cook and/or feed myself 0 No Nutrition Protocols Good Risk Protocol Moderate Risk Protocol 0 Provide education on nutrition High Risk Proctocol Risk Level: Moderate Risk Score: 3 Electronic Signature(s) Signed: 01/28/2022 4:34:51 PM By: Shawn Stall RN, BSN Entered By: Shawn Stall on 01/28/2022 09:53:11

## 2022-01-29 ENCOUNTER — Ambulatory Visit (HOSPITAL_COMMUNITY)
Admission: RE | Admit: 2022-01-29 | Discharge: 2022-01-29 | Disposition: A | Discharge status: Home / Self Care | Payer: Medicare HMO | Source: Ambulatory Visit | Attending: Internal Medicine | Admitting: Internal Medicine

## 2022-01-29 ENCOUNTER — Other Ambulatory Visit (HOSPITAL_COMMUNITY): Payer: Self-pay | Admitting: Internal Medicine

## 2022-01-29 DIAGNOSIS — L97512 Non-pressure chronic ulcer of other part of right foot with fat layer exposed: Secondary | ICD-10-CM | POA: Diagnosis present

## 2022-01-30 ENCOUNTER — Ambulatory Visit (HOSPITAL_COMMUNITY)
Admission: RE | Admit: 2022-01-30 | Discharge: 2022-01-30 | Disposition: A | Discharge status: Home / Self Care | Payer: Medicare HMO | Source: Ambulatory Visit | Attending: Internal Medicine | Admitting: Internal Medicine

## 2022-01-30 ENCOUNTER — Encounter (HOSPITAL_BASED_OUTPATIENT_CLINIC_OR_DEPARTMENT_OTHER): Payer: Medicare HMO | Admitting: Internal Medicine

## 2022-01-30 ENCOUNTER — Ambulatory Visit (INDEPENDENT_AMBULATORY_CARE_PROVIDER_SITE_OTHER)
Admission: RE | Admit: 2022-01-30 | Discharge: 2022-01-30 | Disposition: A | Discharge status: Home / Self Care | Payer: Medicare HMO | Source: Ambulatory Visit | Attending: Internal Medicine | Admitting: Internal Medicine

## 2022-01-30 ENCOUNTER — Other Ambulatory Visit (HOSPITAL_COMMUNITY): Payer: Self-pay | Admitting: Internal Medicine

## 2022-01-30 DIAGNOSIS — I739 Peripheral vascular disease, unspecified: Secondary | ICD-10-CM

## 2022-01-31 ENCOUNTER — Encounter (HOSPITAL_COMMUNITY): Payer: Medicare HMO

## 2022-02-03 ENCOUNTER — Other Ambulatory Visit: Payer: Self-pay | Admitting: Cardiology

## 2022-02-03 DIAGNOSIS — I4821 Permanent atrial fibrillation: Secondary | ICD-10-CM

## 2022-02-04 NOTE — Telephone Encounter (Signed)
Rx(s) sent to pharmacy electronically.  

## 2022-02-06 ENCOUNTER — Encounter: Payer: Self-pay | Admitting: Vascular Surgery

## 2022-02-06 ENCOUNTER — Other Ambulatory Visit: Payer: Self-pay

## 2022-02-06 ENCOUNTER — Ambulatory Visit (INDEPENDENT_AMBULATORY_CARE_PROVIDER_SITE_OTHER): Payer: Medicare HMO | Admitting: Vascular Surgery

## 2022-02-06 VITALS — BP 100/46 | HR 59 | Temp 97.9°F | Resp 20 | Ht 62.0 in | Wt 181.4 lb

## 2022-02-06 DIAGNOSIS — I70299 Other atherosclerosis of native arteries of extremities, unspecified extremity: Secondary | ICD-10-CM

## 2022-02-06 DIAGNOSIS — L97909 Non-pressure chronic ulcer of unspecified part of unspecified lower leg with unspecified severity: Secondary | ICD-10-CM

## 2022-02-06 DIAGNOSIS — I872 Venous insufficiency (chronic) (peripheral): Secondary | ICD-10-CM

## 2022-02-06 NOTE — Progress Notes (Signed)
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  This is clearly a limb threatening situation.  I have recommended that we proceed with arteriography to see what options she might have for revascularization.  Given her age she would not be a good candidate for an extensive bypass but hopefully she would have an endovascular approach to address her infrainguinal arterial occlusive disease.   I have reviewed with the patient the indications for arteriography. In addition, I have reviewed the potential complications of arteriography including but not limited to: Bleeding, arterial injury, arterial thrombosis, dye action, renal insufficiency, or other unpredictable medical problems. I have explained to the patient that if we find disease amenable to angioplasty we could potentially address this at the same time. I have discussed the potential complications of angioplasty and stenting, including but not limited to: Bleeding, arterial thrombosis, arterial injury, dissection, or the need for surgical intervention.  She does have a history of a dye allergy so she will need to be premedicated.  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  Her arteriogram is scheduled for 02/21/2022.  I will make further recommendations pending these results.  CHRONIC VENOUS INSUFFICIENCY: The patient also has evidence of chronic venous insufficiency on exam and a history of previous DVTs in the past.  Certainly the swelling in the leg is also interfering with healing.  I explained that if we can improve her arterial flow we will better be able to have her elevate her leg and do some compression therapy.  For now although I think these options are limited because of her PAD.   REASON FOR CONSULT:    Peripheral arterial  disease with right foot ulcer.  The consult is requested by the wound care center.  HPI:   [name] is a 88 y.o. female who is referred with peripheral arterial disease and an ulcer on the right foot.  I have reviewed the records from the referring office.  Of note the patient underwent noninvasive studies on 01/30/2022 and those results are discussed below.  The patient is followed at the [facility].  The patient is scheduled to be seen again tomorrow.  On my history the patient developed a wound on the right second toe and required amputation I believe about 6 weeks ago.  She has developed a wound at the base of the toe and reportedly underwent an x-ray which did not show underlying osteomyelitis.  Her activity is very limited so I do not get any history of claudication.  She denies any history of rest pain.  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  She also has a history of atrial fibrillation.  Her risk factors for peripheral arterial disease include hypertension and a remote history of tobacco use.  She quit in 2003.  She denies any history of diabetes, hypercholesterolemia, or family history of premature cardiovascular disease.  Past Medical History:  Diagnosis Date   Anticoagulant long-term use    Arrhythmia    Arthritis    Atrial fibrillation (HCC)    Congestive heart failure (CHF) (HCC)    Dysphagia    Esophageal reflux  Essential hypertension    Hiatal hernia    Pulmonary embolus (HCC)    Thyroid disease     Family History  Problem Relation Age of Onset   Stroke Mother    Cancer Brother     SOCIAL HISTORY: Social History   Tobacco Use   Smoking status: Former    Packs/day: 1.00    Types: Cigarettes    Quit date: 10/02/2001    Years since quitting: 20.3   Smokeless tobacco: Never   Tobacco comments:    former smoker  Substance Use Topics   Alcohol use: Not on  file    Allergies  Allergen Reactions   Acetaminophen    Amoxicillin    Azithromycin     Other reaction(s): unsure   Codeine     Other reaction(s): unsure   Erythromycin    Erythromycin Ethylsuccinate    Green Dyes Other (See Comments)    Allergic to ALL dyes   Iodine    Misc. Sulfonamide Containing Compounds    Oxycodone    Oxycodone-Acetaminophen    Penicillins    Sulfasalazine    Tramadol Hives    Current Outpatient Medications  Medication Sig Dispense Refill   acetaminophen (TYLENOL) 325 MG tablet Take 650 mg by mouth every 6 (six) hours as needed for moderate pain.     Apoaequorin (PREVAGEN PO) Take 1 capsule by mouth at bedtime.     bumetanide (BUMEX) 1 MG tablet Take 1 tablet (1 mg total) by mouth daily. May take an additional tablet as needed for weight gain of 3 pounds overnight or 5 pounds in one week. 100 tablet 1   carboxymethylcellulose (REFRESH PLUS) 0.5 % SOLN 1 drop in the morning and at bedtime.     cholecalciferol (VITAMIN D3) 25 MCG (1000 UT) tablet Take 1,000 Units by mouth daily.     clotrimazole-betamethasone (LOTRISONE) cream Apply 1 application. topically 2 (two) times daily as needed (rash/irritation).     diclofenac Sodium (VOLTAREN) 1 % GEL Apply 2 g topically 4 (four) times daily as needed (pain).     gabapentin (NEURONTIN) 300 MG capsule Take 300 mg by mouth 2 (two) times daily.      levothyroxine (SYNTHROID) 100 MCG tablet Take 100 mcg by mouth every morning.     losartan (COZAAR) 100 MG tablet Take 1 tablet (100 mg total) by mouth daily. 90 tablet 3   Menthol, Topical Analgesic, (BIOFREEZE) 10 % CREA Apply 1 application. topically daily as needed (pain).     metoprolol succinate (TOPROL-XL) 50 MG 24 hr tablet TAKE 1 TABLET BY MOUTH EVERY DAY WITH OR IMMEDIATELY FOLLOWING A MEAL 90 tablet 2   Multiple Vitamins-Minerals (PRESERVISION/LUTEIN) CAPS Take 1 capsule by mouth 2 (two) times daily.      pantoprazole (PROTONIX) 40 MG tablet Take 40 mg by  mouth daily.     senna-docusate (SENOKOT-S) 8.6-50 MG tablet Take 1 tablet by mouth 2 (two) times daily between meals as needed for moderate constipation or mild constipation.  0   spironolactone (ALDACTONE) 25 MG tablet Take 1 tablet (25 mg total) by mouth daily with breakfast. 90 tablet 1   vitamin B-12 (CYANOCOBALAMIN) 1000 MCG tablet Take 1,000 mcg by mouth daily.     warfarin (COUMADIN) 1 MG tablet Take 1-2 mg by mouth See admin instructions. Take 1mg  by mouth on Mondays, Wednesdays, and Fridays. Then take two of the 1 mg tablets total of 2 mg by mouth on Tuesdays, Thursdays, Saturdays, and Sundays  No current facility-administered medications for this visit.    REVIEW OF SYSTEMS:  [X]  denotes positive finding, [ ]  denotes negative finding Cardiac  Comments:  Chest pain or chest pressure:    Shortness of breath upon exertion: x   Short of breath when lying flat:    Irregular heart rhythm: x       Vascular    Pain in calf, thigh, or hip brought on by ambulation: x   Pain in feet at night that wakes you up from your sleep:  x   Blood clot in your veins: x   Leg swelling:  x       Pulmonary    Oxygen at home:    Productive cough:     Wheezing:         Neurologic    Sudden weakness in arms or legs:     Sudden numbness in arms or legs:     Sudden onset of difficulty speaking or slurred speech:    Temporary loss of vision in one eye:     Problems with dizziness:         Gastrointestinal    Blood in stool:     Vomited blood:         Genitourinary    Burning when urinating:     Blood in urine:        Psychiatric    Major depression:         Hematologic    Bleeding problems:    Problems with blood clotting too easily:        Skin    Rashes or ulcers:        Constitutional    Fever or chills:    -  PHYSICAL EXAM:   Vitals:   02/06/22 1459  BP: (!) 100/46  Pulse: (!) 59  Resp: 20  Temp: 97.9 F (36.6 C)  SpO2: 92%  Weight: 181 lb 6.4 oz (82.3 kg)   Height: 5\' 2"  (1.575 m)   Body mass index is 33.18 kg/m. GENERAL: The patient is a well-nourished female, in no acute distress. The vital signs are documented above. CARDIAC: There is a regular rate and rhythm.  VASCULAR: I do not detect carotid bruits. She does have palpable femoral pulses. I cannot palpate popliteal or pedal pulses. She has bilateral lower extremity swelling and hyperpigmentation consistent with chronic venous insufficiency. She has a wound at the base of her right second toe amputation site as shown in the photograph below.   PULMONARY: There is good air exchange bilaterally without wheezing or rales. ABDOMEN: Soft and non-tender with normal pitched bowel sounds.  MUSCULOSKELETAL: There are no major deformities. NEUROLOGIC: No focal weakness or paresthesias are detected. SKIN: There are no ulcers or rashes noted. PSYCHIATRIC: The patient has a normal affect.  DATA:    ARTERIAL DOPPLER STUDY: I reviewed the arterial Doppler study that was done on 01/30/2022.  On the right side there is a monophasic dorsalis pedis and posterior tibial signal.  ABIs 52%.  Toe pressures 0.  On the left side there is a monophasic dorsalis pedis and posterior tibial signal.  ABI is 93%.  Toe pressures 45 mmHg.  ARTERIAL DUPLEX: I have reviewed the arterial duplex scan that was done on 01/30/2022.  On the right side there is a biphasic common femoral artery signal.  There is evidence of moderate stenosis in the proximal and mid superficial femoral artery.  There is biphasic flow down to the popliteal artery  and then monophasic flow distally.  The patient appears to have tibial artery occlusive disease.  On the left side, there is a biphasic common femoral signal.  There is biphasic flow down to the distal superficial femoral artery.  There appears to be severe tibial artery occlusive disease on the left.  Images were somewhat limited on the left because of movement.  LABS: I reviewed  the labs from 11/21/2021.  GFR was 39.  Creatinine was 1.29.   Deitra Mayo [facility]

## 2022-02-07 ENCOUNTER — Encounter (HOSPITAL_BASED_OUTPATIENT_CLINIC_OR_DEPARTMENT_OTHER): Payer: Medicare HMO | Attending: Internal Medicine | Admitting: Internal Medicine

## 2022-02-07 DIAGNOSIS — I4891 Unspecified atrial fibrillation: Secondary | ICD-10-CM | POA: Diagnosis not present

## 2022-02-07 DIAGNOSIS — G609 Hereditary and idiopathic neuropathy, unspecified: Secondary | ICD-10-CM | POA: Insufficient documentation

## 2022-02-07 DIAGNOSIS — L97512 Non-pressure chronic ulcer of other part of right foot with fat layer exposed: Secondary | ICD-10-CM | POA: Diagnosis present

## 2022-02-07 DIAGNOSIS — Z7901 Long term (current) use of anticoagulants: Secondary | ICD-10-CM | POA: Insufficient documentation

## 2022-02-07 DIAGNOSIS — I872 Venous insufficiency (chronic) (peripheral): Secondary | ICD-10-CM | POA: Insufficient documentation

## 2022-02-07 DIAGNOSIS — L03031 Cellulitis of right toe: Secondary | ICD-10-CM | POA: Diagnosis not present

## 2022-02-07 DIAGNOSIS — M869 Osteomyelitis, unspecified: Secondary | ICD-10-CM | POA: Diagnosis not present

## 2022-02-07 DIAGNOSIS — J449 Chronic obstructive pulmonary disease, unspecified: Secondary | ICD-10-CM | POA: Insufficient documentation

## 2022-02-07 DIAGNOSIS — I890 Lymphedema, not elsewhere classified: Secondary | ICD-10-CM | POA: Diagnosis not present

## 2022-02-07 DIAGNOSIS — Z89421 Acquired absence of other right toe(s): Secondary | ICD-10-CM | POA: Insufficient documentation

## 2022-02-07 NOTE — Progress Notes (Signed)
SABA, GOMM ([id number]) Visit Report for 02/07/2022 Arrival Information Details Patient Name: Date of Service: AMARA, JUSTEN. 02/07/2022 9:45 A M Medical Record Number: [id number] Patient Account Number: [id number] Date of Birth/Sex: Treating RN: [birth year] (88 y.o. [name], [name] Primary Care Zuri Lascala: [name] Other Clinician: Referring Tanaja Ganger: Treating Betzalel Umbarger/Extender: [name] in Treatment: 1 Visit Information History Since Last Visit Added or deleted any medications: No Patient Arrived: Wheel Chair Any new allergies or adverse reactions: No Arrival Time: 10:12 Had a fall or experienced change in No Accompanied By: daughter activities of daily living that may affect Transfer Assistance: Manual risk of falls: Patient Identification Verified: Yes Signs or symptoms of abuse/neglect since last visito No Secondary Verification Process Completed: Yes Hospitalized since last visit: No Patient Requires Transmission-Based Precautions: No Implantable device outside of the clinic excluding No Patient Has Alerts: Yes cellular tissue based products placed in the center Patient Alerts: Patient on Blood Thinner since last visit: Has Dressing in Place as Prescribed: Yes Pain Present Now: No Electronic Signature(s) Signed: 02/07/2022 1:47:28 PM By: Shawn Stall RN, BSN Entered By: Shawn Stall on 02/07/2022 10:17:51 -------------------------------------------------------------------------------- Encounter Discharge Information Details Patient Name: Date of Service: [name] 02/07/2022 9:45 A M Medical Record Number: [id number] Patient Account Number: [id number] Date of Birth/Sex: Treating RN: 30-Apr-1928 (88 y.o. [name], [name] Primary Care Undra Trembath: [name] Other Clinician: Referring Zoeie Ritter: Treating Tremaine Fuhriman/Extender: [name] in Treatment: 1 Encounter Discharge Information Items Post Procedure  Vitals Discharge Condition: Stable Temperature (F): 98.1 Ambulatory Status: Wheelchair Pulse (bpm): 53 Discharge Destination: Home Respiratory Rate (breaths/min): 20 Transportation: Private Auto Blood Pressure (mmHg): 106/49 Accompanied By: daughter Schedule Follow-up Appointment: Yes Clinical Summary of Care: Electronic Signature(s) Signed: 02/07/2022 1:47:28 PM By: Shawn Stall RN, BSN Entered By: Shawn Stall on 02/07/2022 12:11:11 -------------------------------------------------------------------------------- Lower Extremity Assessment Details Patient Name: Date of Service: [name] 02/07/2022 9:45 A M Medical Record Number: [id number] Patient Account Number: [id number] Date of Birth/Sex: Treating RN: 05/11/1928 (88 y.o. [name], [name] Primary Care Gennett Garcia: [name] Other Clinician: Referring Jeriah Skufca: Treating Ayiden Milliman/Extender: [name] Weeks in Treatment: 1 Edema Assessment Assessed: Kyra Searles: Yes] Franne Forts: Yes] Edema: [Left: Yes] [Right: Yes] Calf Left: Right: Point of Measurement: 30 cm From Medial Instep 40 cm 44.8 cm Ankle Left: Right: Point of Measurement: 9 cm From Medial Instep 28 cm 24.6 cm Electronic Signature(s) Signed: 02/07/2022 1:47:28 PM By: Shawn Stall RN, BSN Entered By: Shawn Stall on 02/07/2022 10:23:58 -------------------------------------------------------------------------------- Multi Wound Chart Details Patient Name: Date of Service: [name] 02/07/2022 9:45 A M Medical Record Number: [id number] Patient Account Number: [id number] Date of Birth/Sex: Treating RN: [birth year] (88 y.o. [name], [name] Primary Care Saryn Cherry: [name] Other Clinician: Referring Veleka Djordjevic: Treating Arlow Spiers/Extender: [name] Weeks in Treatment: 1 Vital Signs Height(in): 62 Pulse(bpm): 53 Weight(lbs): 181 Blood Pressure(mmHg): 106/49 Body Mass Index(BMI): 33.1 Temperature(F):  98.1 Respiratory Rate(breaths/min): 20 Photos: Right, Lateral Lower Leg Right Amputation Site - Toe Left, Anterior Lower Leg Wound Location: Bump Surgical Injury Bump Wounding Event: Abrasion Open Surgical Wound Abrasion Primary Etiology: Lymphedema N/A Lymphedema Secondary Etiology: Cataracts, Anemia, Congestive Heart Cataracts, Anemia, Congestive Heart Cataracts, Anemia, Congestive Heart Comorbid History: Failure, Hypotension, Peripheral Failure, Hypotension, Peripheral Failure, Hypotension, Peripheral Venous Disease, Osteomyelitis, Venous Disease, Osteomyelitis, Venous Disease, Osteomyelitis, Neuropathy Neuropathy Neuropathy 01/14/2022 11/17/2021 01/14/2022 Date A cquired: 1 1 1  Weeks of Treatment: Healed - Epithelialized Open Open Wound Status: No No  No Wound Recurrence: 0x0x0 0.8x0.3x0.6 0x0x0 Measurements L x W x D (cm) 0 0.188 0 A (cm) : rea 0 0.113 0 Volume (cm) : 100.00% 56.50% 100.00% % Reduction in A rea: 100.00% 47.70% 100.00% % Reduction in Volume: Full Thickness Without Exposed Full Thickness Without Exposed Full Thickness Without Exposed Classification: Support Structures Support Structures Support Structures None Present Medium None Present Exudate A mount: N/A Serosanguineous N/A Exudate Type: N/A red, brown N/A Exudate Color: Distinct, outline attached Epibole Distinct, outline attached Wound Margin: None Present (0%) None Present (0%) None Present (0%) Granulation A mount: N/A N/A N/A Granulation Quality: None Present (0%) Large (67-100%) None Present (0%) Necrotic A mount: Fascia: No Fat Layer (Subcutaneous Tissue): Yes Fascia: No Exposed Structures: Fat Layer (Subcutaneous Tissue): No Fascia: No Fat Layer (Subcutaneous Tissue): No Tendon: No Tendon: No Tendon: No Muscle: No Muscle: No Muscle: No Joint: No Joint: No Joint: No Bone: No Bone: No Bone: No Large (67-100%) None Large (67-100%) Epithelialization: N/A N/A  N/A Debridement: N/A N/A N/A Pain Control: N/A N/A N/A Tissue Debrided: N/A N/A N/A Level: N/A N/A N/A Debridement A (sq cm): rea N/A N/A N/A Instrument: N/A N/A N/A Bleeding: N/A N/A N/A Hemostasis A chieved: N/A N/A N/A Procedural Pain: N/A N/A N/A Post Procedural Pain: Debridement Treatment Response: N/A N/A N/A Post Debridement Measurements L x N/A N/A N/A W x D (cm) N/A N/A N/A Post Debridement Volume: (cm) N/A N/A N/A Procedures Performed: Wound Number: 5 N/A N/A Photos: N/A N/A Right T Great oe N/A N/A Wound Location: Shear/Friction N/A N/A Wounding Event: Abrasion N/A N/A Primary Etiology: N/A N/A N/A Secondary Etiology: Cataracts, Anemia, Congestive Heart N/A N/A Comorbid History: Failure, Hypotension, Peripheral Venous Disease, Osteomyelitis, Neuropathy 02/06/2022 N/A N/A Date Acquired: 0 N/A N/A Weeks of Treatment: Open N/A N/A Wound Status: No N/A N/A Wound Recurrence: 0.7x0.7x0.1 N/A N/A Measurements L x W x D (cm) 0.385 N/A N/A A (cm) : rea 0.038 N/A N/A Volume (cm) : N/A N/A N/A % Reduction in Area: N/A N/A N/A % Reduction in Volume: Full Thickness Without Exposed N/A N/A Classification: Support Structures Medium N/A N/A Exudate Amount: Serosanguineous N/A N/A Exudate Type: red, brown N/A N/A Exudate Color: Distinct, outline attached N/A N/A Wound Margin: Medium (34-66%) N/A N/A Granulation Amount: Pink, Pale N/A N/A Granulation Quality: None Present (0%) N/A N/A Necrotic Amount: Fat Layer (Subcutaneous Tissue): Yes N/A N/A Exposed Structures: Fascia: No Tendon: No Muscle: No Joint: No Bone: No None N/A N/A Epithelialization: Debridement - Excisional N/A N/A Debridement: 10:35 N/A N/A Pre-procedure Verification/Time Out Taken: Other N/A N/A Pain Control: Subcutaneous, Slough N/A N/A Tissue Debrided: Skin/Subcutaneous Tissue N/A N/A Level: 0.49 N/A N/A Debridement A (sq cm): rea Curette N/A  N/A Instrument: Minimum N/A N/A Bleeding: Pressure N/A N/A Hemostasis A chieved: 0 N/A N/A Procedural Pain: 0 N/A N/A Post Procedural Pain: Procedure was tolerated well N/A N/A Debridement Treatment Response: 0.7x0.7x0.1 N/A N/A Post Debridement Measurements L x W x D (cm) 0.038 N/A N/A Post Debridement Volume: (cm) Debridement N/A N/A Procedures Performed: Treatment Notes Electronic Signature(s) Signed: 02/07/2022 12:22:48 PM By: Geralyn Corwin DO Signed: 02/07/2022 1:47:28 PM By: Shawn Stall RN, BSN Entered By: Geralyn Corwin on 02/07/2022 11:28:35 -------------------------------------------------------------------------------- Multi-Disciplinary Care Plan Details Patient Name: Date of Service: [name] 02/07/2022 9:45 A M Medical Record Number: [id number] Patient Account Number: [id number] Date of Birth/Sex: Treating RN: 17-Jun-1928 (88 y.o. [name] Primary Care Soyla Bainter: [name] Other Clinician: Referring Lun Muro: Treating  Javel Hersh/Extender: [name] Weeks in Treatment: 1 Active Inactive Nutrition Nursing Diagnoses: Potential for alteratiion in Nutrition/Potential for imbalanced nutrition Goals: Patient/caregiver agrees to and verbalizes understanding of need to use nutritional supplements and/or vitamins as prescribed Date Initiated: 01/28/2022 Target Resolution Date: 02/14/2022 Goal Status: Active Interventions: Assess patient nutrition upon admission and as needed per policy Provide education on nutrition Treatment Activities: Patient referred to Primary Care Physician for further nutritional evaluation : 01/28/2022 Notes: Pain, Acute or Chronic Nursing Diagnoses: Pain, acute or chronic: actual or potential Potential alteration in comfort, pain Goals: Patient will verbalize adequate pain control and receive pain control interventions during procedures as needed Date Initiated: 01/28/2022 Target Resolution  Date: 02/13/2022 Goal Status: Active Interventions: Encourage patient to take pain medications as prescribed Provide education on pain management Treatment Activities: Administer pain control measures as ordered : 01/28/2022 Notes: Wound/Skin Impairment Nursing Diagnoses: Knowledge deficit related to ulceration/compromised skin integrity Goals: Patient/caregiver will verbalize understanding of skin care regimen Date Initiated: 01/28/2022 Target Resolution Date: 02/14/2022 Goal Status: Active Interventions: Assess patient/caregiver ability to perform ulcer/skin care regimen upon admission and as needed Assess ulceration(s) every visit Provide education on ulcer and skin care Treatment Activities: Skin care regimen initiated : 01/28/2022 Topical wound management initiated : 01/28/2022 Notes: Electronic Signature(s) Signed: 02/07/2022 1:47:28 PM By: Shawn Stall RN, BSN Entered By: Shawn Stall on 02/07/2022 10:41:29 -------------------------------------------------------------------------------- Pain Assessment Details Patient Name: Date of Service: [name] 02/07/2022 9:45 A M Medical Record Number: [id number] Patient Account Number: [id number] Date of Birth/Sex: Treating RN: [birth year] (88 y.o. [name] Primary Care Mariadelosang Wynns: [name] Other Clinician: Referring Taytum Wheller: Treating Tonilynn Bieker/Extender: [name] in Treatment: 1 Active Problems Location of Pain Severity and Description of Pain Patient Has Paino No Site Locations Rate the pain. Current Pain Level: 0 Pain Management and Medication Current Pain Management: Medication: No Cold Application: No Rest: No Massage: No Activity: No T.E.N.S.: No Heat Application: No Leg drop or elevation: No Is the Current Pain Management Adequate: Adequate How does your wound impact your activities of daily livingo Sleep: No Bathing: No Appetite: No Relationship With Others:  No Bladder Continence: No Emotions: No Bowel Continence: No Work: No Toileting: No Drive: No Dressing: No Hobbies: No Psychologist, prison and probation services) Signed: 02/07/2022 1:47:28 PM By: Shawn Stall RN, BSN Entered By: Shawn Stall on 02/07/2022 10:18:10 -------------------------------------------------------------------------------- Patient/Caregiver Education Details Patient Name: Date of Service: [name] 6/2/2023andnbsp9:45 A M Medical Record Number: [id number] Patient Account Number: [id number] Date of Birth/Gender: Treating RN: 01-12-1928 (88 y.o. [name] Primary Care Physician: [name] Other Clinician: Referring Physician: Treating Physician/Extender: [name] in Treatment: 1 Education Assessment Education Provided To: Patient Education Topics Provided Wound/Skin Impairment: Handouts: Skin Care Do's and Dont's Methods: Explain/Verbal Responses: Reinforcements needed Electronic Signature(s) Signed: 02/07/2022 1:47:28 PM By: Shawn Stall RN, BSN Entered By: Shawn Stall on 02/07/2022 10:42:03 -------------------------------------------------------------------------------- Wound Assessment Details Patient Name: Date of Service: [name] 02/07/2022 9:45 A M Medical Record Number: [id number] Patient Account Number: [id number] Date of Birth/Sex: Treating RN: [birth year] (88 y.o. [name], [name] Primary Care Marthann Abshier: [name] Other Clinician: Referring Greogry Goodwyn: Treating Marlenne Ridge/Extender: [name] Weeks in Treatment: 1 Wound Status Wound Number: 2 Primary Abrasion Etiology: Wound Location: Right, Lateral Lower Leg Secondary Lymphedema Wounding Event: Bump Etiology: Etiology: Date Acquired: 01/14/2022 Wound Healed - Epithelialized Weeks Of Treatment: 1 Status: Clustered Wound: No Comorbid Cataracts, Anemia, Congestive Heart Failure, Hypotension, History:  Peripheral Venous  Disease, Osteomyelitis, Neuropathy Photos Wound Measurements Length: (cm) Width: (cm) Depth: (cm) Area: (cm) Volume: (cm) 0 % Reduction in Area: 100% 0 % Reduction in Volume: 100% 0 Epithelialization: Large (67-100%) 0 Tunneling: No 0 Undermining: No Wound Description Classification: Full Thickness Without Exposed Support Structu Wound Margin: Distinct, outline attached Exudate Amount: None Present res Foul Odor After Cleansing: No Slough/Fibrino No Wound Bed Granulation Amount: None Present (0%) Exposed Structure Necrotic Amount: None Present (0%) Fascia Exposed: No Fat Layer (Subcutaneous Tissue) Exposed: No Tendon Exposed: No Muscle Exposed: No Joint Exposed: No Bone Exposed: No Electronic Signature(s) Signed: 02/07/2022 1:47:28 PM By: Shawn Stalleaton, Bobbi RN, BSN Entered By: Shawn Stalleaton, Bobbi on 02/07/2022 10:25:49 -------------------------------------------------------------------------------- Wound Assessment Details Patient Name: Date of Service: [name], [name] 02/07/2022 9:45 A M Medical Record Number: [id number] Patient Account Number: [id number] Date of Birth/Sex: Treating RN: 10/06/1927 (88 y.o. Debara PickettF) Deaton, Millard.LoaBobbi Primary Care Kaylee Trivett: Mila PalmerWolters, Sharon Other Clinician: Referring Niyla Marone: Treating Stan Cantave/Extender: Sallee ProvencalHoffman, Jessica Wolters, Sharon Weeks in Treatment: 1 Wound Status Wound Number: 3 Primary Open Surgical Wound Etiology: Wound Location: Right Amputation Site - Toe Wound Open Wounding Event: Surgical Injury Status: Date Acquired: 11/17/2021 Comorbid Cataracts, Anemia, Congestive Heart Failure, Hypotension, Weeks Of Treatment: 1 History: Peripheral Venous Disease, Osteomyelitis, Neuropathy Clustered Wound: No Photos Wound Measurements Length: (cm) 0.8 Width: (cm) 0.3 Depth: (cm) 0.6 Area: (cm) 0.188 Volume: (cm) 0.113 % Reduction in Area: 56.5% % Reduction in Volume: 47.7% Epithelialization: None Tunneling: No Undermining: No Wound  Description Classification: Full Thickness Without Exposed Support Structures Wound Margin: Epibole Exudate Amount: Medium Exudate Type: Serosanguineous Exudate Color: red, brown Foul Odor After Cleansing: No Slough/Fibrino Yes Wound Bed Granulation Amount: None Present (0%) Exposed Structure Necrotic Amount: Large (67-100%) Fascia Exposed: No Necrotic Quality: Adherent Slough Fat Layer (Subcutaneous Tissue) Exposed: Yes Tendon Exposed: No Muscle Exposed: No Joint Exposed: No Bone Exposed: No Treatment Notes Wound #3 (Amputation Site - Toe) Wound Laterality: Right Cleanser Soap and Water Discharge Instruction: May shower and wash wound with dial antibacterial soap and water prior to dressing change. Wound Cleanser Discharge Instruction: Cleanse the wound with wound cleanser prior to applying a clean dressing using gauze sponges, not tissue or cotton balls. Peri-Wound Care Topical Compounding T opical Antibiotics Discharge Instruction: [facility] once [facility] arrives, in place of the Dakin's. Primary Dressing Dakin's Solution 0.25%, 16 (oz) Discharge Instruction: Moisten gauze with Dakin's solution Secondary Dressing Woven Gauze Sponge, Non-Sterile 4x4 in Discharge Instruction: Apply over primary dressing as directed. Secured With Conforming Stretch Gauze Bandage, Sterile 2x75 (in/in) Discharge Instruction: Secure with stretch gauze as directed. Paper Tape, 2x10 (in/yd) Discharge Instruction: Secure dressing with tape as directed. Compression Wrap Compression Stockings Add-Ons Electronic Signature(s) Signed: 02/07/2022 1:47:28 PM By: Shawn Stalleaton, Bobbi RN, BSN Signed: 02/07/2022 1:47:28 PM By: Shawn Stalleaton, Bobbi RN, BSN Entered By: Shawn Stalleaton, Bobbi on 02/07/2022 10:27:19 -------------------------------------------------------------------------------- Wound Assessment Details Patient Name: Date of Service: [name], [name] 02/07/2022 9:45 A  M Medical Record Number: [id number] Patient Account Number: [id number] Date of Birth/Sex: Treating RN: 04/20/1928 (88 y.o. Debara PickettF) Deaton, Yvonne KendallBobbi Primary Care Rhesa Forsberg: Mila PalmerWolters, Sharon Other Clinician: Referring Tereso Unangst: Treating Daryle Amis/Extender: Sallee ProvencalHoffman, Jessica Wolters, Sharon Weeks in Treatment: 1 Wound Status Wound Number: 4 Primary Abrasion Etiology: Wound Location: Left, Anterior Lower Leg Secondary Lymphedema Wounding Event: Bump Etiology: Date Acquired: 01/14/2022 Wound Open Weeks Of Treatment: 1 Status: Clustered Wound: No Comorbid Cataracts, Anemia, Congestive Heart Failure, Hypotension, History: Peripheral Venous Disease, Osteomyelitis, Neuropathy Photos Wound  Measurements Length: (cm) Width: (cm) Depth: (cm) Area: (cm) Volume: (cm) 0 % Reduction in Area: 100% 0 % Reduction in Volume: 100% 0 Epithelialization: Large (67-100%) 0 Tunneling: No 0 Undermining: No Wound Description Classification: Full Thickness Without Exposed Support Structures Wound Margin: Distinct, outline attached Exudate Amount: None Present Foul Odor After Cleansing: No Slough/Fibrino No Wound Bed Granulation Amount: None Present (0%) Exposed Structure Necrotic Amount: None Present (0%) Fascia Exposed: No Fat Layer (Subcutaneous Tissue) Exposed: No Tendon Exposed: No Muscle Exposed: No Joint Exposed: No Bone Exposed: No Electronic Signature(s) Signed: 02/07/2022 1:47:28 PM By: Shawn Stall RN, BSN Entered By: Shawn Stall on 02/07/2022 10:26:21 -------------------------------------------------------------------------------- Wound Assessment Details Patient Name: Date of Service: [name] 02/07/2022 9:45 A M Medical Record Number: [id number] Patient Account Number: [id number] Date of Birth/Sex: Treating RN: 04/22/1928 (88 y.o. [name], [name] Primary Care Bethsaida Siegenthaler: [name] Other Clinician: Referring Anahi Belmar: Treating Tildon Silveria/Extender: [name] Weeks in Treatment: 1 Wound Status Wound Number: 5 Primary Abrasion Etiology: Wound Location: Right T Great oe Wound Open Wounding Event: Shear/Friction Status: Date Acquired: 02/06/2022 Comorbid Cataracts, Anemia, Congestive Heart Failure, Hypotension, Weeks Of Treatment: 0 History: Peripheral Venous Disease, Osteomyelitis, Neuropathy Clustered Wound: No Photos Wound Measurements Length: (cm) 0.7 Width: (cm) 0.7 Depth: (cm) 0.1 Area: (cm) 0.385 Volume: (cm) 0.038 % Reduction in Area: % Reduction in Volume: Epithelialization: None Tunneling: No Undermining: No Wound Description Classification: Full Thickness Without Exposed Support Structures Wound Margin: Distinct, outline attached Exudate Amount: Medium Exudate Type: Serosanguineous Exudate Color: red, brown Foul Odor After Cleansing: No Slough/Fibrino No Wound Bed Granulation Amount: Medium (34-66%) Exposed Structure Granulation Quality: Pink, Pale Fascia Exposed: No Necrotic Amount: None Present (0%) Fat Layer (Subcutaneous Tissue) Exposed: Yes Tendon Exposed: No Muscle Exposed: No Joint Exposed: No Bone Exposed: No Treatment Notes Wound #5 (Toe Great) Wound Laterality: Right Cleanser Soap and Water Discharge Instruction: May shower and wash wound with dial antibacterial soap and water prior to dressing change. Peri-Wound Care Topical Mupirocin Ointment Discharge Instruction: IN clinic only as instructed bacitracin Discharge Instruction: apply daily Primary Dressing Secondary Dressing Woven Gauze Sponges 2x2 in Discharge Instruction: Apply over primary dressing as directed. Secured With Conforming Stretch Gauze Bandage, Sterile 2x75 (in/in) Discharge Instruction: Secure with stretch gauze as directed. Compression Wrap Compression Stockings Add-Ons Electronic Signature(s) Signed: 02/07/2022 1:47:28 PM By: Shawn Stall RN, BSN Entered By: Shawn Stall on 02/07/2022  10:28:51 -------------------------------------------------------------------------------- Vitals Details Patient Name: Date of Service: [name] 02/07/2022 9:45 A M Medical Record Number: [id number] Patient Account Number: [id number] Date of Birth/Sex: Treating RN: 1927/12/02 (88 y.o. [name], [name] Primary Care Dareion Kneece: [name] Other Clinician: Referring Dastan Krider: Treating Draco Malczewski/Extender: [name] in Treatment: 1 Vital Signs Time Taken: 10:10 Temperature (F): 98.1 Height (in): 62 Pulse (bpm): 53 Weight (lbs): 181 Respiratory Rate (breaths/min): 20 Body Mass Index (BMI): 33.1 Blood Pressure (mmHg): 106/49 Reference Range: 80 - 120 mg / dl Electronic Signature(s) Signed: 02/07/2022 1:47:28 PM By: Shawn Stall RN, BSN Entered By: Shawn Stall on 02/07/2022 10:29:05

## 2022-02-07 NOTE — Progress Notes (Signed)
VALLEN, TEER ([id number]) Visit Report for 02/07/2022 Chief Complaint Document Details Patient Name: Date of Service: [name], [name]. 02/07/2022 9:45 A [name] Medical Record Number: [id number] Patient Account Number: [id number] Date of Birth/Sex: Treating RN: 1927/11/13 (88 y.o. [name] Primary Care Provider: [name] Other Clinician: Referring Provider: Treating Provider/Extender: Theodosia Quay in Treatment: 1 Information Obtained from: Patient Chief Complaint 01/28/2022; right second toe amputation site dehiscence and bilateral lower extremity wounds. Electronic Signature(s) Signed: 02/07/2022 12:22:48 PM By: Kalman Shan DO Entered By: Kalman Shan on 02/07/2022 11:28:42 -------------------------------------------------------------------------------- Debridement Details Patient Name: Date of Service: [name]. 02/07/2022 9:45 A [name] Medical Record Number: [id number] Patient Account Number: [id number] Date of Birth/Sex: Treating RN: 02-03-1928 (88 y.o. [name], [name] Primary Care Provider: [name] Other Clinician: Referring Provider: Treating Provider/Extender: Theodosia Quay in Treatment: 1 Debridement Performed for Assessment: Wound #5 Right T Great oe Performed By: Physician Kalman Shan, DO Debridement Type: Debridement Level of Consciousness (Pre-procedure): Awake and Alert Pre-procedure Verification/Time Out Yes - 10:35 Taken: Start Time: 10:36 Pain Control: Other : Benzocaine 20% spray T Area Debrided (L x W): otal 0.7 (cm) x 0.7 (cm) = 0.49 (cm) Tissue and other material debrided: Viable, Non-Viable, Slough, Subcutaneous, Skin: Dermis , Skin: Epidermis, Slough Level: Skin/Subcutaneous Tissue Debridement Description: Excisional Instrument: Curette Bleeding: Minimum Hemostasis Achieved: Pressure End Time: 10:42 Procedural Pain: 0 Post Procedural Pain: 0 Response to Treatment:  Procedure was tolerated well Level of Consciousness (Post- Awake and Alert procedure): Post Debridement Measurements of Total Wound Length: (cm) 0.7 Width: (cm) 0.7 Depth: (cm) 0.1 Volume: (cm) 0.038 Character of Wound/Ulcer Post Debridement: Improved Post Procedure Diagnosis Same as Pre-procedure Electronic Signature(s) Signed: 02/07/2022 12:22:48 PM By: Kalman Shan DO Signed: 02/07/2022 1:47:28 PM By: Deon Pilling RN, BSN Entered By: Deon Pilling on 02/07/2022 10:42:55 -------------------------------------------------------------------------------- HPI Details Patient Name: Date of Service: [name]. 02/07/2022 9:45 A [name] Medical Record Number: [id number] Patient Account Number: [id number] Date of Birth/Sex: Treating RN: [birth year] (88 y.o. [name] Primary Care Provider: [name] Other Clinician: Referring Provider: Treating Provider/Extender: Theodosia Quay in Treatment: 1 History of Present Illness HPI Description: Admission 01/28/2022 [name] is a 88 year old female with a past medical history of idiopathic peripheral neuropathy status post amputation to the second right toe secondary to osteomyelitis, COPD and A-fib on Coumadin the presents to the clinic for a 25-month history of nonhealing ulcer to a previous amputation site on the second right toe. She states she has tried Medihoney and silver alginate in the past to this area with little benefit. She also has 2 small areas limited to skin breakdown to her lower extremities bilaterally. She has chronic venous insufficiency but not has not been wearing her compression stockings. She states she bumped her legs against an object and not so the wound started. She has been using Medihoney to the sites. She denies signs of infection. 6/2; patient presents for follow-up. She had an x-ray of her right foot done at last clinic visit and this was negative for evidence of osteomyelitis.  She also had a wound culture done that showed extra high levels of Staph aureus. I recommended [facility] antibiotics for this and this was ordered. She had ABIs with TBI's done as well that showed monophasic waveforms to the right foot with TBI of 0 and an ABI of 0.52. Urgent referral was made to vein and vascular and she saw Dr.  Dixon on 6/1, yesterday and he recommended an arteriogram. This is scheduled for 6/16. Patient also reports a new wound to the right great toe. This is a blister that has ruptured. She also reports increased erythema to the toe. Electronic Signature(s) Signed: 02/07/2022 12:22:48 PM By: Kalman Shan DO Entered By: Kalman Shan on 02/07/2022 11:30:15 -------------------------------------------------------------------------------- Physical Exam Details Patient Name: Date of Service: [name]. 02/07/2022 9:45 A [name] Medical Record Number: [id number] Patient Account Number: [id number] Date of Birth/Sex: Treating RN: 07/07/1928 (87 y.o. [name] Primary Care Provider: [name] Other Clinician: Referring Provider: Treating Provider/Extender: Edgardo Roys Weeks in Treatment: 1 Constitutional respirations regular, non-labored and within target range for patient.[name] Kitchen Psychiatric pleasant and cooperative. Notes Right foot: T the second digit there is an amputation site that is open with nonviable tissue and scant granulation tissue. Does not probe to bone. No o surrounding signs of infection. T the great toe there is a blister that has ruptured. Postdebridement there was granulation tissue throughout. Increased o erythema to the great toe. Bilateral lower extremity has epithelization to the previous wound site. No weeping noted. Venous stasis dermatitis bilaterally Faint pedal pulses Electronic Signature(s) Signed: 02/07/2022 12:22:48 PM By: Kalman Shan DO Entered By: Kalman Shan on 02/07/2022  12:13:05 -------------------------------------------------------------------------------- Physician Orders Details Patient Name: Date of Service: [name]. 02/07/2022 9:45 A [name] Medical Record Number: [id number] Patient Account Number: [id number] Date of Birth/Sex: Treating RN: 1928/06/11 (88 y.o. [name], [name] Primary Care Provider: [name] Other Clinician: Referring Provider: Treating Provider/Extender: Theodosia Quay in Treatment: 1 Verbal / Phone Orders: No Diagnosis Coding ICD-10 Coding Code Description [phone] Non-pressure chronic ulcer of other part of right foot with fat layer exposed L97.812 Non-pressure chronic ulcer of other part of right lower leg with fat layer exposed L97.822 Non-pressure chronic ulcer of other part of left lower leg with fat layer exposed G90.09 Other idiopathic peripheral autonomic neuropathy M86.9 Osteomyelitis, unspecified I48.91 Unspecified atrial fibrillation Z79.01 Long term (current) use of anticoagulants I89.0 Lymphedema, not elsewhere classified I87.2 Venous insufficiency (chronic) (peripheral) J44.9 Chronic obstructive pulmonary disease, unspecified Follow-up Appointments ppointment in 1 week. - Dr. Heber [place] [facility] [place], Room 8 Tuesday 02/18/2022 Return A Other: - Use bacitracin ointment for right great toe. ***Pick up oral antibiotic Keflex from your pharmacy start taking.*** ***[facility] will call you to ship the topical antibiotics.*** Once it arrives use topical antibiotics in place of Dakin's. Please bring it in at your appt times.*** Edema Control - Lymphedema / SCD / Other Elevate legs to the level of the heart or above for 30 minutes daily and/or when sitting, a frequency of: - throughout the day. Avoid standing for long periods of time. Home Health New wound care orders this week; continue Home Health for wound care. May utilize formulary equivalent dressing for wound treatment orders  unless otherwise specified. - Home health weekly dressing changes. All other days daughter to change. Other Home Health Orders/Instructions: - [place]. Wound Treatment Wound #3 - Amputation Site - Toe Wound Laterality: Right Cleanser: Soap and Water (Home Health) 1 x Per Day/30 Days Discharge Instructions: May shower and wash wound with dial antibacterial soap and water prior to dressing change. Cleanser: Wound Cleanser (Home Health) 1 x Per Day/30 Days Discharge Instructions: Cleanse the wound with wound cleanser prior to applying a clean dressing using gauze sponges, not tissue or cotton balls. Topical: Compounding T opical Antibiotics (Home Health) 1 x Per Day/30  Days Discharge Instructions: Apply T opical Antibiotics from [facility] daily once it arrives, in place of the Dakin's. Prim Dressing: Dakin's Solution 0.25%, 16 (oz) (Home Health) 1 x Per Day/30 Days ary Discharge Instructions: Moisten gauze with Dakin's solution Secondary Dressing: Woven Gauze Sponge, Non-Sterile 4x4 in (Home Health) 1 x Per Day/30 Days Discharge Instructions: Apply over primary dressing as directed. Secured With: Child psychotherapist, Sterile 2x75 (in/in) (Home Health) 1 x Per Day/30 Days Discharge Instructions: Secure with stretch gauze as directed. Secured With: Paper Tape, 2x10 (in/yd) (Home Health) 1 x Per Day/30 Days Discharge Instructions: Secure dressing with tape as directed. Wound #5 - T Great oe Wound Laterality: Right Cleanser: Soap and Water (Home Health) 1 x Per Day/30 Days Discharge Instructions: May shower and wash wound with dial antibacterial soap and water prior to dressing change. Topical: Mupirocin Ointment 1 x Per Day/30 Days Discharge Instructions: IN clinic only as instructed Topical: bacitracin (Home Health) 1 x Per Day/30 Days Discharge Instructions: apply daily Secondary Dressing: Woven Gauze Sponges 2x2 in ([place]) 1 x Per Day/30 Days Discharge  Instructions: Apply over primary dressing as directed. Secured With: Child psychotherapist, Sterile 2x75 (in/in) (Home Health) 1 x Per Day/30 Days Discharge Instructions: Secure with stretch gauze as directed. Electronic Signature(s) Signed: 02/07/2022 12:22:48 PM By: Kalman Shan DO Previous Signature: 02/07/2022 10:49:53 AM Version By: Kalman Shan DO Entered By: Kalman Shan on 02/07/2022 12:13:17 -------------------------------------------------------------------------------- Problem List Details Patient Name: Date of Service: [name]. 02/07/2022 9:45 A [name] Medical Record Number: [id number] Patient Account Number: [id number] Date of Birth/Sex: Treating RN: 1928/06/03 (88 y.o. [name], [name] Primary Care Provider: [name] Other Clinician: Referring Provider: Treating Provider/Extender: Theodosia Quay in Treatment: 1 Active Problems ICD-10 Encounter Code Description Active Date MDM Diagnosis [phone] Non-pressure chronic ulcer of other part of right foot with fat layer exposed 01/28/2022 No Yes L97.812 Non-pressure chronic ulcer of other part of right lower leg with fat layer 01/28/2022 No Yes exposed L97.822 Non-pressure chronic ulcer of other part of left lower leg with fat layer exposed5/23/2023 No Yes G90.09 Other idiopathic peripheral autonomic neuropathy 01/28/2022 No Yes M86.9 Osteomyelitis, unspecified 01/28/2022 No Yes I48.91 Unspecified atrial fibrillation 01/28/2022 No Yes Z79.01 Long term (current) use of anticoagulants 01/28/2022 No Yes I89.0 Lymphedema, not elsewhere classified 01/28/2022 No Yes I87.2 Venous insufficiency (chronic) (peripheral) 01/28/2022 No Yes J44.9 Chronic obstructive pulmonary disease, unspecified 01/28/2022 No Yes Inactive Problems Resolved Problems Electronic Signature(s) Signed: 02/07/2022 12:22:48 PM By: Kalman Shan DO Entered By: Kalman Shan on 02/07/2022  11:28:29 -------------------------------------------------------------------------------- Progress Note Details Patient Name: Date of Service: [name]. 02/07/2022 9:45 A [name] Medical Record Number: [id number] Patient Account Number: [id number] Date of Birth/Sex: Treating RN: October 28, 1927 (88 y.o. [name] Primary Care Provider: [name] Other Clinician: Referring Provider: Treating Provider/Extender: Theodosia Quay in Treatment: 1 Subjective Chief Complaint Information obtained from Patient 01/28/2022; right second toe amputation site dehiscence and bilateral lower extremity wounds. History of Present Illness (HPI) Admission 01/28/2022 Ms. Pavielle Wofford is a 88 year old female with a past medical history of idiopathic peripheral neuropathy status post amputation to the second right toe secondary to osteomyelitis, COPD and A-fib on Coumadin the presents to the clinic for a 41-month history of nonhealing ulcer to a previous amputation site on the second right toe. She states she has tried Medihoney and silver alginate in the past to this area with little benefit. She also has  2 small areas limited to skin breakdown to her lower extremities bilaterally. She has chronic venous insufficiency but not has not been wearing her compression stockings. She states she bumped her legs against an object and not so the wound started. She has been using Medihoney to the sites. She denies signs of infection. 6/2; patient presents for follow-up. She had an x-ray of her right foot done at last clinic visit and this was negative for evidence of osteomyelitis. She also had a wound culture done that showed extra high levels of Staph aureus. I recommended [facility] antibiotics for this and this was ordered. She had ABIs with TBI's done as well that showed monophasic waveforms to the right foot with TBI of 0 and an ABI of 0.52. Urgent referral was made to vein and vascular and she  saw Dr. Durwin Noraixon on 6/1, yesterday and he recommended an arteriogram. This is scheduled for 6/16. Patient also reports a new wound to the right great toe. This is a blister that has ruptured. She also reports increased erythema to the toe. Patient History Information obtained from Patient, Caregiver. Family History Cancer - Siblings, Heart Disease, Stroke - Mother, No family history of Diabetes. Social History Former smoker - quit 2003, Marital Status - Widowed, Alcohol Use - Never, Drug Use - No History, Caffeine Use - Never. Medical History Eyes Patient has history of Cataracts Hematologic/Lymphatic Patient has history of Anemia Respiratory Denies history of Chronic Obstructive Pulmonary Disease (COPD) Cardiovascular Patient has history of Congestive Heart Failure, Hypotension, Peripheral Venous Disease Musculoskeletal Patient has history of 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 Neurologic Patient has history of Neuropathy Medical A Surgical History Notes nd Hematologic/Lymphatic Thrombocytopenia Endocrine Hyperthyroidism, Hypothyroidism Objective Constitutional respirations regular, non-labored and within target range for patient.. Vitals Time Taken: 10:10 AM, Height: 62 in, Weight: 181 lbs, BMI: 33.1, Temperature: 98.1 F, Pulse: 53 bpm, Respiratory Rate: 20 breaths/min, Blood Pressure: 106/49 mmHg. Psychiatric pleasant and cooperative. General Notes: Right foot: T the second digit there is an amputation site that is open with nonviable tissue and scant granulation tissue. Does not probe to o bone. No surrounding signs of infection. T the great toe there is a blister that has ruptured. Postdebridement there was granulation tissue throughout. o Increased erythema to the great toe. Bilateral lower extremity has epithelization to the previous wound site. No weeping noted. Venous stasis dermatitis bilaterally Faint pedal pulses Integumentary (Hair, Skin) Wound #2  status is Healed - Epithelialized. Original cause of wound was Bump. The date acquired was: 01/14/2022. The wound has been in treatment 1 weeks. The wound is located on the Right,Lateral Lower Leg. The wound measures 0cm length x 0cm width x 0cm depth; 0cm^2 area and 0cm^3 volume. There is no tunneling or undermining noted. There is a none present amount of drainage noted. The wound margin is distinct with the outline attached to the wound base. There is no granulation within the wound bed. There is no necrotic tissue within the wound bed. Wound #3 status is Open. Original cause of wound was Surgical Injury. The date acquired was: 11/17/2021. The wound has been in treatment 1 weeks. The wound is located on the Right Amputation Site - T The wound measures 0.8cm length x 0.3cm width x 0.6cm depth; 0.188cm^2 area and 0.113cm^3 volume. oe. There is Fat Layer (Subcutaneous Tissue) exposed. There is no tunneling or undermining noted. There is a medium amount of serosanguineous drainage noted. The wound margin is epibole. There is no granulation within  the wound bed. There is a large (67-100%) amount of necrotic tissue within the wound bed including Adherent Slough. Wound #4 status is Open. Original cause of wound was Bump. The date acquired was: 01/14/2022. The wound has been in treatment 1 weeks. The wound is located on the Left,Anterior Lower Leg. The wound measures 0cm length x 0cm width x 0cm depth; 0cm^2 area and 0cm^3 volume. There is no tunneling or undermining noted. There is a none present amount of drainage noted. The wound margin is distinct with the outline attached to the wound base. There is no granulation within the wound bed. There is no necrotic tissue within the wound bed. Wound #5 status is Open. Original cause of wound was Shear/Friction. The date acquired was: 02/06/2022. The wound is located on the Right T Great. The oe wound measures 0.7cm length x 0.7cm width x 0.1cm depth; 0.385cm^2 area  and 0.038cm^3 volume. There is Fat Layer (Subcutaneous Tissue) exposed. There is no tunneling or undermining noted. There is a medium amount of serosanguineous drainage noted. The wound margin is distinct with the outline attached to the wound base. There is medium (34-66%) pink, pale granulation within the wound bed. There is no necrotic tissue within the wound bed. Assessment Active Problems ICD-10 Non-pressure chronic ulcer of other part of right foot with fat layer exposed Non-pressure chronic ulcer of other part of right lower leg with fat layer exposed Non-pressure chronic ulcer of other part of left lower leg with fat layer exposed Other idiopathic peripheral autonomic neuropathy Osteomyelitis, unspecified Unspecified atrial fibrillation Long term (current) use of anticoagulants Lymphedema, not elsewhere classified Venous insufficiency (chronic) (peripheral) Chronic obstructive pulmonary disease, unspecified Leg wounds have healed. Patient has a new wound to the right great toe, Likely from friction. I debrided nonviable tissue. I recommended bacitracin ointment here and I will prescribe her Keflex for soft tissue infection. Her amputation site on the right foot is stable. I ordered a culture at last clinic visit and we ordered [facility] antibiotics for high levels of Staph aureus. We gave her the number to call to obtain this medicine. She can start this when this arrives. For now she can use Dakin's wet-to-dry dressings. She is scheduled for an arteriogram due to abnormal ABI and TBI results. She will have this done on 6/16. Follow-up in 1 week. Procedures Wound #5 Pre-procedure diagnosis of Wound #5 is an Abrasion located on the Right T Great . There was a Excisional Skin/Subcutaneous Tissue Debridement with a oe total area of 0.49 sq cm performed by Kalman Shan, DO. With the following instrument(s): Curette to remove Viable and Non-Viable tissue/material. Material removed  includes Subcutaneous Tissue, Slough, Skin: Dermis, and Skin: Epidermis after achieving pain control using Other (Benzocaine 20% spray). A time out was conducted at 10:35, prior to the start of the procedure. A Minimum amount of bleeding was controlled with Pressure. The procedure was tolerated well with a pain level of 0 throughout and a pain level of 0 following the procedure. Post Debridement Measurements: 0.7cm length x 0.7cm width x 0.1cm depth; 0.038cm^3 volume. Character of Wound/Ulcer Post Debridement is improved. Post procedure Diagnosis Wound #5: Same as Pre-Procedure Plan Follow-up Appointments: Return Appointment in 1 week. - Dr. Heber [place] and [place], Room 8 Tuesday 02/18/2022 Other: - Use bacitracin ointment for right great toe. ***Pick up oral antibiotic Keflex from your pharmacy start taking.*** ***[facility] will call you to ship the topical antibiotics.*** Once it arrives use topical antibiotics in place of Dakin's.  Please bring it in at your appt times.*** Edema Control - Lymphedema / SCD / Other: Elevate legs to the level of the heart or above for 30 minutes daily and/or when sitting, a frequency of: - throughout the day. Avoid standing for long periods of time. Home Health: New wound care orders this week; continue Home Health for wound care. May utilize formulary equivalent dressing for wound treatment orders unless otherwise specified. - Home health weekly dressing changes. All other days daughter to change. Other Home Health Orders/Instructions: - [place]. WOUND #3: - Amputation Site - T oe Wound Laterality: Right Cleanser: Soap and Water (Home Health) 1 x Per Day/30 Days Discharge Instructions: May shower and wash wound with dial antibacterial soap and water prior to dressing change. Cleanser: Wound Cleanser (Home Health) 1 x Per Day/30 Days Discharge Instructions: Cleanse the wound with wound cleanser prior to applying a clean dressing using gauze  sponges, not tissue or cotton balls. Topical: Compounding T opical Antibiotics (Home Health) 1 x Per Day/30 Days Discharge Instructions: Apply T opical Antibiotics from [facility] daily once it arrives, in place of the Dakin's. Prim Dressing: Dakin's Solution 0.25%, 16 (oz) (Home Health) 1 x Per Day/30 Days ary Discharge Instructions: Moisten gauze with Dakin's solution Secondary Dressing: Woven Gauze Sponge, Non-Sterile 4x4 in (Home Health) 1 x Per Day/30 Days Discharge Instructions: Apply over primary dressing as directed. Secured With: Child psychotherapist, Sterile 2x75 (in/in) (Home Health) 1 x Per Day/30 Days Discharge Instructions: Secure with stretch gauze as directed. Secured With: Paper T ape, 2x10 (in/yd) ([place]) 1 x Per Day/30 Days Discharge Instructions: Secure dressing with tape as directed. WOUND #5: - T Great Wound Laterality: Right oe Cleanser: Soap and Water (Home Health) 1 x Per Day/30 Days Discharge Instructions: May shower and wash wound with dial antibacterial soap and water prior to dressing change. Topical: Mupirocin Ointment 1 x Per Day/30 Days Discharge Instructions: IN clinic only as instructed Topical: bacitracin (Home Health) 1 x Per Day/30 Days Discharge Instructions: apply daily Secondary Dressing: Woven Gauze Sponges 2x2 in ([place]) 1 x Per Day/30 Days Discharge Instructions: Apply over primary dressing as directed. Secured With: Child psychotherapist, Sterile 2x75 (in/in) (Home Health) 1 x Per Day/30 Days Discharge Instructions: Secure with stretch gauze as directed. 1. Keflex 2. Bacitracin 3. Dakin's wet-to-dry 4. [facility] 5. Follow-up in 1 week 6. In office sharp debridement Electronic Signature(s) Signed: 02/07/2022 12:22:48 PM By: Kalman Shan DO Entered By: Kalman Shan on 02/07/2022 12:21:27 -------------------------------------------------------------------------------- HxROS Details Patient Name:  Date of Service: Derrek Gu TTIE [name]. 02/07/2022 9:45 A [name] Medical Record Number: [id number] Patient Account Number: [id number] Date of Birth/Sex: Treating RN: October 13, 1927 (88 y.o. [name] Primary Care Provider: [name] Other Clinician: Referring Provider: Treating Provider/Extender: Theodosia Quay in Treatment: 1 Information Obtained From Patient Caregiver Eyes Medical History: Positive for: Cataracts Hematologic/Lymphatic Medical History: Positive for: Anemia Past Medical History Notes: Thrombocytopenia Respiratory Medical History: Negative for: Chronic Obstructive Pulmonary Disease (COPD) Cardiovascular Medical History: Positive for: Congestive Heart Failure; Hypotension; Peripheral Venous Disease Endocrine Medical History: Past Medical History Notes: Hyperthyroidism, Hypothyroidism Musculoskeletal Medical History: Positive for: 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 Neurologic Medical History: Positive for: Neuropathy HBO Extended History Items Eyes: Cataracts Immunizations Pneumococcal Vaccine: Received Pneumococcal Vaccination: No Implantable Devices None Family and Social History Cancer: Yes - Siblings; Diabetes: No; Heart Disease: Yes; Stroke: Yes - Mother; Former smoker - quit 2003; Marital Status -  Widowed; Alcohol Use: Never; Drug Use: No History; Caffeine Use: Never; Financial Concerns: No; Food, Clothing or Shelter Needs: No; Support System Lacking: No; Transportation Concerns: No Electronic Signature(s) Signed: 02/07/2022 12:22:48 PM By: Kalman Shan DO Signed: 02/07/2022 1:47:28 PM By: Deon Pilling RN, BSN Entered By: Kalman Shan on 02/07/2022 11:30:19 -------------------------------------------------------------------------------- SuperBill Details Patient Name: Date of Service: [name]. 02/07/2022 Medical Record Number: [id number] Patient Account Number: [id number] Date of  Birth/Sex: Treating RN: Oct 13, 1927 (88 y.o. [name], [name] Primary Care Provider: [name] Other Clinician: Referring Provider: Treating Provider/Extender: Theodosia Quay in Treatment: 1 Diagnosis Coding ICD-10 Codes Code Description [phone] Non-pressure chronic ulcer of other part of right foot with fat layer exposed L97.812 Non-pressure chronic ulcer of other part of right lower leg with fat layer exposed L97.822 Non-pressure chronic ulcer of other part of left lower leg with fat layer exposed G90.09 Other idiopathic peripheral autonomic neuropathy M86.9 Osteomyelitis, unspecified I48.91 Unspecified atrial fibrillation Z79.01 Long term (current) use of anticoagulants I89.0 Lymphedema, not elsewhere classified I87.2 Venous insufficiency (chronic) (peripheral) J44.9 Chronic obstructive pulmonary disease, unspecified L03.031 Cellulitis of right toe Facility Procedures CPT4 Code: [id number] Description: 11042 - DEB SUBQ TISSUE 20 SQ CM/< ICD-10 Diagnosis Description L97.512 Non-pressure chronic ulcer of other part of right foot with fat layer exposed Modifier: Quantity: 1 Physician Procedures : CPT4 Code Description Modifier [id number] - WC PHYS LEVEL 4 - EST PT ICD-10 Diagnosis Description L03.031 Cellulitis of right toe L97.512 Non-pressure chronic ulcer of other part of right foot with fat layer exposed I87.2 Venous insufficiency  (chronic) (peripheral) M86.9 Osteomyelitis, unspecified Quantity: 1 : [id number] 11042 - WC PHYS SUBQ TISS 20 SQ CM ICD-10 Diagnosis Description L97.512 Non-pressure chronic ulcer of other part of right foot with fat layer exposed Quantity: 1 Electronic Signature(s) Signed: 02/07/2022 12:22:48 PM By: Kalman Shan DO Entered By: Kalman Shan on 02/07/2022 12:22:07

## 2022-02-11 ENCOUNTER — Encounter (HOSPITAL_BASED_OUTPATIENT_CLINIC_OR_DEPARTMENT_OTHER): Payer: Medicare HMO | Admitting: Internal Medicine

## 2022-02-11 DIAGNOSIS — L97512 Non-pressure chronic ulcer of other part of right foot with fat layer exposed: Secondary | ICD-10-CM | POA: Diagnosis not present

## 2022-02-11 NOTE — Progress Notes (Signed)
[name], [name] ([id number]) Visit Report for 02/11/2022 HPI Details Patient Name: Date of Service: [name], [name]. 02/11/2022 1:45 PM Medical Record Number: [id number] Patient Account Number: [id number] Date of Birth/Sex: Treating RN: [birth year] (88 y.o. [name] Primary Care Provider: Jonathon [name] Other Clinician: Referring Provider: Treating Provider/Extender: [name] in Treatment: 2 History of Present Illness HPI Description: Admission 01/28/2022 [name] is a 88 year old female with a past medical history of idiopathic peripheral neuropathy status post amputation to the second right toe secondary to osteomyelitis, COPD and A-fib on Coumadin the presents to the clinic for a 108-month history of nonhealing ulcer to a previous amputation site on the second right toe. She states she has tried Medihoney and silver alginate in the past to this area with little benefit. She also has 2 small areas limited to skin breakdown to her lower extremities bilaterally. She has chronic venous insufficiency but not has not been wearing her compression stockings. She states she bumped her legs against an object and not so the wound started. She has been using Medihoney to the sites. She denies signs of infection. 6/2; patient presents for follow-up. She had an x-ray of her right foot done at last clinic visit and this was negative for evidence of osteomyelitis. She also had a wound culture done that showed extra high levels of Staph aureus. I recommended Megan antibiotics for this and this was ordered. She had ABIs with TBI's done as well that showed monophasic waveforms to the right foot with TBI of 0 and an ABI of 0.52. Urgent referral was made to vein and vascular and she saw Dr. Doren [name] on 6/1, yesterday and he recommended an arteriogram. This is scheduled for 6/16. Patient also reports a new wound to the right great toe. This is a blister that has ruptured. She  also reports increased erythema to the toe. 6/6; the patient was worked in urgently today at the insistence of her daughter out of concern for a new wound on the lateral part of the plantar right great toe. She has her original postsurgical wound after the amputation of the right second toe, she has a wound on the medial part of the right great toe. The patient is apparently going for an angiogram by Dr. Doren [name] in 2 weeks time. Electronic Signature(s) Signed: 02/11/2022 5:45:31 PM By: Megan Ham MD Entered By: [name] on 02/11/2022 15:34:56 -------------------------------------------------------------------------------- Physical Exam Details Patient Name: Date of Service: [name]. 02/11/2022 1:45 PM Medical Record Number: [id number] Patient Account Number: [id number] Date of Birth/Sex: Treating RN: 16-May-1928 (88 y.o. [name] Primary Care Provider: Jonathon [name] Other Clinician: Referring Provider: Treating Provider/Extender: [name] in Treatment: 2 Constitutional Patient is hypertensive.. Pulse regular and within target range for patient.[name] Kitchen Respirations regular, non-labored and within target range.. Temperature is normal and within the target range for the patient.[name] Kitchen Appears in no distress. Notes Wound exam; right foot Second toe amputation site has a small wound but that probes roughly 1 cm. I used a #3 curette to remove surface slough there was no bleeding. Tissue looks clean Area on the medial part of the first toe appears to be stable to improved The area of concern on the posterior lateral part of the first toe that the daughter called about I cannot identify a wound, and neither could our intake nurse. The toe itself probably is constantly in a varus situation and that may be the problem here.  We will put gauze laterally in this toe to try and keep it forward.. Electronic Signature(s) Signed: 02/11/2022 5:45:31 PM By: Megan Najjar MD Entered By: [name] on 02/11/2022 15:36:41 -------------------------------------------------------------------------------- Physician Orders Details Patient Name: Date of Service: [name]. 02/11/2022 1:45 PM Medical Record Number: [id number] Patient Account Number: [id number] Date of Birth/Sex: Treating RN: 12-22-1927 (88 y.o. [name], [name] Primary Care Provider: Mila [name] Other Clinician: Referring Provider: Treating Provider/Extender: [name] in Treatment: 2 Verbal / Phone Orders: No Diagnosis Coding ICD-10 Coding Code Description [phone] Non-pressure chronic ulcer of other part of right foot with fat layer exposed L97.812 Non-pressure chronic ulcer of other part of right lower leg with fat layer exposed L97.822 Non-pressure chronic ulcer of other part of left lower leg with fat layer exposed G90.09 Other idiopathic peripheral autonomic neuropathy M86.9 Osteomyelitis, unspecified I48.91 Unspecified atrial fibrillation Z79.01 Long term (current) use of anticoagulants I89.0 Lymphedema, not elsewhere classified I87.2 Venous insufficiency (chronic) (peripheral) J44.9 Chronic obstructive pulmonary disease, unspecified Follow-up Appointments ppointment in 1 week. - Dr. Mikey [name] and [name], Room 8 Tuesday 02/18/2022 Return A Other: - Use bacitracin ointment for right great toe. ***[name] start using when it arrives under the Prisma to right amputation toe site.*** Edema Control - Lymphedema / SCD / Other Elevate legs to the level of the heart or above for 30 minutes daily and/or when sitting, a frequency of: - throughout the day. Avoid standing for long periods of time. Home Health New wound care orders this week; continue Home Health for wound care. May utilize formulary equivalent dressing for wound treatment orders unless otherwise specified. - Home health weekly dressing changes. All other days daughter to  change. Other Home Health Orders/Instructions: - [facility]. Wound Treatment Wound #3 - Amputation Site - Toe Wound Laterality: Right Cleanser: Soap and Water (Home Health) 1 x Per Day/30 Days Discharge Instructions: May shower and wash wound with dial antibacterial soap and water prior to dressing change. Cleanser: Wound Cleanser (Home Health) 1 x Per Day/30 Days Discharge Instructions: Cleanse the wound with wound cleanser prior to applying a clean dressing using gauze sponges, not tissue or cotton balls. Topical: Compounding T opical Antibiotics (Home Health) 1 x Per Day/30 Days Discharge Instructions: Apply T opical Antibiotics from [facility] daily once it arrives, applies over the [place]. Prim Dressing: Promogran Prisma Matrix, 4.34 (sq in) (silver collagen) (Home Health) 1 x Per Day/30 Days ary Discharge Instructions: Moisten collagen with saline or hydrogel directly to wound bed. apply over the [place] when arrives. Secondary Dressing: Woven Gauze Sponges 2x2 in (Home Health) 1 x Per Day/30 Days Discharge Instructions: Apply over primary dressing. ***Apply a rolled gauze under the right great toe. Secured With: Insurance underwriter, Sterile 2x75 (in/in) (Home Health) 1 x Per Day/30 Days Discharge Instructions: Secure with stretch gauze as directed. Secured With: Paper Tape, 2x10 (in/yd) (Home Health) 1 x Per Day/30 Days Discharge Instructions: Secure dressing with tape as directed. Wound #5 - T Great oe Wound Laterality: Right Cleanser: Soap and Water (Home Health) 1 x Per Day/30 Days Discharge Instructions: May shower and wash wound with dial antibacterial soap and water prior to dressing change. Topical: Mupirocin Ointment 1 x Per Day/30 Days Discharge Instructions: IN clinic only as instructed Topical: bacitracin (Home Health) 1 x Per Day/30 Days Discharge Instructions: apply daily Secondary Dressing: Woven Gauze Sponges 2x2 in (Home Health) 1 x Per  Day/30 Days Discharge Instructions: Apply over primary  dressing ensure rolled 2x2 gauze to keep toe straight. Secured With: Child psychotherapist, Sterile 2x75 (in/in) (Home Health) 1 x Per Day/30 Days Discharge Instructions: Secure with stretch gauze as directed. Electronic Signature(s) Signed: 02/11/2022 5:45:31 PM By: Megan Ham MD Signed: 02/11/2022 6:06:53 PM By: Deon Pilling RN, BSN Entered By: Deon Pilling on 02/11/2022 15:14:01 -------------------------------------------------------------------------------- Problem List Details Patient Name: Date of Service: [name]. 02/11/2022 1:45 PM Medical Record Number: [id number] Patient Account Number: [id number] Date of Birth/Sex: Treating RN: November 30, 1927 (88 y.o. [name], Tammi Klippel Primary Care Provider: Jonathon [name] Other Clinician: Referring Provider: Treating Provider/Extender: [name] in Treatment: 2 Active Problems ICD-10 Encounter Code Description Active Date MDM Diagnosis [phone] Non-pressure chronic ulcer of other part of right foot with fat layer exposed 01/28/2022 No Yes L97.822 Non-pressure chronic ulcer of other part of left lower leg with fat layer exposed5/23/2023 No Yes G90.09 Other idiopathic peripheral autonomic neuropathy 01/28/2022 No Yes M86.9 Osteomyelitis, unspecified 01/28/2022 No Yes I48.91 Unspecified atrial fibrillation 01/28/2022 No Yes Z79.01 Long term (current) use of anticoagulants 01/28/2022 No Yes I89.0 Lymphedema, not elsewhere classified 01/28/2022 No Yes I87.2 Venous insufficiency (chronic) (peripheral) 01/28/2022 No Yes J44.9 Chronic obstructive pulmonary disease, unspecified 01/28/2022 No Yes Inactive Problems ICD-10 Code Description Active Date Inactive Date L97.812 Non-pressure chronic ulcer of other part of right lower leg with fat layer exposed 01/28/2022 01/28/2022 Resolved Problems Electronic Signature(s) Signed: 02/11/2022 5:45:31 PM By: Megan Ham MD Entered By: [name] on 02/11/2022 15:33:46 -------------------------------------------------------------------------------- Progress Note Details Patient Name: Date of Service: [name]. 02/11/2022 1:45 PM Medical Record Number: [id number] Patient Account Number: [id number] Date of Birth/Sex: Treating RN: May 04, 1928 (88 y.o. [name] Primary Care Provider: Jonathon [name] Other Clinician: Referring Provider: Treating Provider/Extender: [name] in Treatment: 2 Subjective History of Present Illness (HPI) Admission 01/28/2022 [name] is a 88 year old female with a past medical history of idiopathic peripheral neuropathy status post amputation to the second right toe secondary to osteomyelitis, COPD and A-fib on Coumadin the presents to the clinic for a 76-month history of nonhealing ulcer to a previous amputation site on the second right toe. She states she has tried Medihoney and silver alginate in the past to this area with little benefit. She also has 2 small areas limited to skin breakdown to her lower extremities bilaterally. She has chronic venous insufficiency but not has not been wearing her compression stockings. She states she bumped her legs against an object and not so the wound started. She has been using Medihoney to the sites. She denies signs of infection. 6/2; patient presents for follow-up. She had an x-ray of her right foot done at last clinic visit and this was negative for evidence of osteomyelitis. She also had a wound culture done that showed extra high levels of Staph aureus. I recommended Megan antibiotics for this and this was ordered. She had ABIs with TBI's done as well that showed monophasic waveforms to the right foot with TBI of 0 and an ABI of 0.52. Urgent referral was made to vein and vascular and she saw Dr. Doren [name] on 6/1, yesterday and he recommended an arteriogram. This is scheduled for 6/16.  Patient also reports a new wound to the right great toe. This is a blister that has ruptured. She also reports increased erythema to the toe. 6/6; the patient was worked in urgently today at the insistence of her daughter out of concern for a new  wound on the lateral part of the plantar right great toe. She has her original postsurgical wound after the amputation of the right second toe, she has a wound on the medial part of the right great toe. The patient is apparently going for an angiogram by Dr. Doren [name] in 2 weeks time. Objective Constitutional Patient is hypertensive.. Pulse regular and within target range for patient.[name] Kitchen Respirations regular, non-labored and within target range.. Temperature is normal and within the target range for the patient.[name] Kitchen Appears in no distress. Vitals Time Taken: 2:36 PM, Height: 62 in, Weight: 181 lbs, BMI: 33.1, Temperature: 98.1 F, Pulse: 55 bpm, Respiratory Rate: 20 breaths/min, Blood Pressure: 154/67 mmHg. General Notes: Wound exam; right foot oo Second toe amputation site has a small wound but that probes roughly 1 cm. I used a #3 curette to remove surface slough there was no bleeding. Tissue looks clean oo Area on the medial part of the first toe appears to be stable to improved oo The area of concern on the posterior lateral part of the first toe that the daughter called about I cannot identify a wound, and neither could our intake nurse. The toe itself probably is constantly in a varus situation and that may be the problem here. We will put gauze laterally in this toe to try and keep it forward.. Integumentary (Hair, Skin) Wound #3 status is Open. Original cause of wound was Surgical Injury. The date acquired was: 11/17/2021. The wound has been in treatment 2 weeks. The wound is located on the Right Amputation Site - T The wound measures 0.7cm length x 0.05cm width x 0.1cm depth; 0.027cm^2 area and 0.003cm^3 volume. oe. There is Fat Layer (Subcutaneous  Tissue) exposed. There is no tunneling or undermining noted. There is a medium amount of serosanguineous drainage noted. The wound margin is epibole. There is no granulation within the wound bed. There is a large (67-100%) amount of necrotic tissue within the wound bed including Adherent Slough. Wound #5 status is Open. Original cause of wound was Shear/Friction. The date acquired was: 02/06/2022. The wound is located on the Right T Great. The oe wound measures 0.5cm length x 0.7cm width x 0.1cm depth; 0.275cm^2 area and 0.027cm^3 volume. There is Fat Layer (Subcutaneous Tissue) exposed. There is no tunneling or undermining noted. There is a medium amount of serosanguineous drainage noted. The wound margin is distinct with the outline attached to the wound base. There is medium (34-66%) pink, pale granulation within the wound bed. There is no necrotic tissue within the wound bed. 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 - Dr. Heber [place] and [place], Room 8 Tuesday 02/18/2022 Other: - Use bacitracin ointment for right great toe. ***[name] start using when it arrives under the Prisma to right amputation toe site.*** Edema Control - Lymphedema / SCD / Other: Elevate legs to the level of the heart or above for 30 minutes daily and/or when sitting, a frequency of: - throughout the day. Avoid standing for long periods of time. Home Health: New wound care orders this week; continue Home Health for wound  care. May utilize formulary equivalent dressing for wound treatment orders  unless otherwise specified. - Home health weekly dressing changes. All other days daughter to change. Other Home Health Orders/Instructions: - [place]. WOUND #3: - Amputation Site - T oe Wound Laterality: Right Cleanser: Soap and Water (Home Health) 1 x Per Day/30 Days Discharge Instructions: May shower and wash wound with dial antibacterial soap and water prior to dressing change. Cleanser: Wound Cleanser (Home Health) 1 x Per Day/30 Days Discharge Instructions: Cleanse the wound with wound cleanser prior to applying a clean dressing using gauze sponges, not tissue or cotton balls. Topical: Compounding T opical Antibiotics (Home Health) 1 x Per Day/30 Days Discharge Instructions: Apply T opical Antibiotics from [facility] daily once it arrives, applies over the [place]. Prim Dressing: Promogran Prisma Matrix, 4.34 (sq in) (silver collagen) (Home Health) 1 x Per Day/30 Days ary Discharge Instructions: Moisten collagen with saline or hydrogel directly to wound bed. apply over the [place] when arrives. Secondary Dressing: Woven Gauze Sponges 2x2 in (Home Health) 1 x Per Day/30 Days Discharge Instructions: Apply over primary dressing. ***Apply a rolled gauze under the right great toe. Secured With: Child psychotherapist, Sterile 2x75 (in/in) (Home Health) 1 x Per Day/30 Days Discharge Instructions: Secure with stretch gauze as directed. Secured With: Paper T ape, 2x10 (in/yd) ([place]) 1 x Per Day/30 Days Discharge Instructions: Secure dressing with tape as directed. WOUND #5: - T Great Wound Laterality: Right oe Cleanser: Soap and Water (Home Health) 1 x Per Day/30 Days Discharge Instructions: May shower and wash wound with dial antibacterial soap and water prior to dressing change. Topical: Mupirocin Ointment 1 x Per Day/30 Days Discharge Instructions: IN clinic only as  instructed Topical: bacitracin (Home Health) 1 x Per Day/30 Days Discharge Instructions: apply daily Secondary Dressing: Woven Gauze Sponges 2x2 in ([place]) 1 x Per Day/30 Days Discharge Instructions: Apply over primary dressing ensure rolled 2x2 gauze to keep toe straight. Secured With: Child psychotherapist, Sterile 2x75 (in/in) (Home Health) 1 x Per Day/30 Days Discharge Instructions: Secure with stretch gauze as directed. 1. I am going to change the packing to the small hole to Prisma moistened with hydrogel. 2. T the medial part of her great toe I am continuing with the same topical antibiotic. o 3. With reference to the concern the daughter had I do not see any open wound in the area that she is concerned about but the toe itself may be pressured against this area so we will try to straighten it out with gauze Electronic Signature(s) Signed: 02/11/2022 5:45:31 PM By: Megan Ham MD Entered By: [name] on 02/11/2022 15:37:58 -------------------------------------------------------------------------------- SuperBill Details Patient Name: Date of Service: [name]. 02/11/2022 Medical Record Number: [id number] Patient Account Number: [id number] Date of Birth/Sex: Treating RN: 1928/05/13 (88 y.o. [name], [name] Primary Care Provider: Jonathon [name] Other Clinician: Referring Provider: Treating Provider/Extender: [name] in Treatment: 2 Diagnosis Coding ICD-10 Codes Code Description [phone] Non-pressure chronic ulcer of other part of right foot with fat layer exposed L97.822 Non-pressure chronic ulcer of other part of left lower leg with fat layer exposed G90.09 Other idiopathic peripheral autonomic neuropathy M86.9 Osteomyelitis, unspecified I48.91 Unspecified atrial fibrillation Z79.01 Long term (current) use of anticoagulants I89.0 Lymphedema, not elsewhere classified I87.2 Venous insufficiency (chronic)  (peripheral) J44.9 Chronic obstructive pulmonary disease, unspecified Physician Procedures : CPT4 Code Description Modifier [id number] - WC PHYS LEVEL 3 - EST PT ICD-10 Diagnosis Description L97.512 Non-pressure chronic ulcer of other part of right  foot with fat layer exposed Quantity: 1 Electronic Signature(s) Signed: 02/11/2022 5:45:31 PM By: Megan Ham MD Entered By: [name] on 02/11/2022 15:38:39

## 2022-02-13 ENCOUNTER — Ambulatory Visit (HOSPITAL_BASED_OUTPATIENT_CLINIC_OR_DEPARTMENT_OTHER): Payer: Medicare HMO | Admitting: Internal Medicine

## 2022-02-13 ENCOUNTER — Ambulatory Visit (INDEPENDENT_AMBULATORY_CARE_PROVIDER_SITE_OTHER): Payer: Medicare HMO | Admitting: Cardiology

## 2022-02-13 ENCOUNTER — Encounter (HOSPITAL_BASED_OUTPATIENT_CLINIC_OR_DEPARTMENT_OTHER): Payer: Self-pay | Admitting: Cardiology

## 2022-02-13 VITALS — BP 80/72 | HR 44 | Ht 62.0 in | Wt 181.0 lb

## 2022-02-13 DIAGNOSIS — I739 Peripheral vascular disease, unspecified: Secondary | ICD-10-CM

## 2022-02-13 DIAGNOSIS — I10 Essential (primary) hypertension: Secondary | ICD-10-CM | POA: Diagnosis not present

## 2022-02-13 DIAGNOSIS — I5032 Chronic diastolic (congestive) heart failure: Secondary | ICD-10-CM | POA: Diagnosis not present

## 2022-02-13 DIAGNOSIS — I4821 Permanent atrial fibrillation: Secondary | ICD-10-CM

## 2022-02-13 DIAGNOSIS — D6869 Other thrombophilia: Secondary | ICD-10-CM

## 2022-02-13 DIAGNOSIS — I272 Pulmonary hypertension, unspecified: Secondary | ICD-10-CM

## 2022-02-13 DIAGNOSIS — Z7901 Long term (current) use of anticoagulants: Secondary | ICD-10-CM

## 2022-02-13 NOTE — Patient Instructions (Addendum)
Medication Instructions:  Today's vital signs are while she was sleeping--not super useful. If the home nurses check, I am interested in blood pressure and heart rate. With her poor circulation, I'm ok with the blood pressure being a little higher (but not too high). I also don't want her heart rate to be too low.  If Blood pressure consistently >150/90 -->call me If blood pressure 120-159/70-90  --> no change to losartan If blood pressure 110-120 on the top -->cut losartan back to 50 mg If blood pressure consistently <110 -->stop losartan and call me  If heart rate ~60 bpm on average, no change to metoprolol If heart rate 50s --> cut to 1/2 pill (25 mg) twice a day If heart rate consistently less than 50 --> stop metoprolol and call me  *If you need a refill on your cardiac medications before your next appointment, please call your pharmacy*   Lab Work: None ordered today   Testing/Procedures: None ordered today   Follow-Up: At [facility], you and your health needs are our priority.  As part of our continuing mission to provide you with exceptional heart care, we have created designated Provider Care Teams.  These Care Teams include your primary Cardiologist (physician) and Advanced Practice Providers (APPs -  Physician Assistants and Nurse Practitioners) who all work together to provide you with the care you need, when you need it.  We recommend signing up for the patient portal called "MyChart".  Sign up information is provided on this After Visit Summary.  MyChart is used to connect with patients for Virtual Visits (Telemedicine).  Patients are able to view lab/test results, encounter notes, upcoming appointments, etc.  Non-urgent messages can be sent to your provider as well.   To learn more about what you can do with MyChart, go to [email or web address].    Your next appointment:   3 month(s)  The format for your next appointment:   In Person  Provider:   [name], MD{

## 2022-02-13 NOTE — Progress Notes (Signed)
Cardiology Office Note:    Date:  02/13/2022   ID:  [name], DOB November 13, 1927, MRN [id number]  PCP:  [name], MD  Cardiologist:  Buford Dresser, MD PhD  Referring MD: Maurice Small, MD   CC: follow up  History of Present Illness:    [name] is a 88 y.o. female with a hx of atrial fibrillation on coumadin, essential hypertension, pulmonary hypertension, and chronic diastolic heart failure. Her initial consult (telemedicine) was 11/25/18.  Cardiac history: Previously followed by Dr. Tish Men at [facility] (notes under media tab). Per notes:  -echo 2014, lexiscan 2015 low risk,  -echo 2019 hyperdynamic EF, septal flattening, severe RV dilation, RVSP noted as critical (near systemic) pulmonary hypertension, severe biatrial enlargement, dilated coronary sinus ?persistent left SVC.  -Venous study 2019 with bilateral mild insufficiency -Echo 11/10/17 with hyperdynamic EF, septal flattening, severe RV dilation, severe TR, severe pulmonary hypertension (listed at 71 mmHg in note)  At her last appointment she had some LE edema. She was drinking more water and reducing soda. Her weight averaged 183-185 lbs; at 190 lbs they planned to begin dietary changes such as cutting out salt.   Today: She was admitted to the hospital 11/15/2021 after presentation with leg wound, pain, and generalized weakness. 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 Her hospital course was complicated by hyponatremia, AKI, and uncontrolled hypertension, improved with medication adjustments. Aldactone was held for 5 days. Her losartan was decreased to 50 mg daily and continued on home Bumex at 1 mg twice daily.   She followed up with Laurann Montana, NP on 12/17/2021 and reported some increased RLE edema. She was advised to continue compression stockings and to take an additional 1 mg Bumex in the afternoon for weight gain of 3 lbs overnight, 5 lbs in 1  week, or LE edema. On 12/18/21 her daughter called and reported she had blood pressures of 878-676 systolic in the evening. Her losartan was increased to 100 mg daily.  Today she is accompanied by her daughter, who provides most of the history. She appears significantly fatigued which they attribute to having 3 different clinic visits today. She is scheduled for a peripheral vascular catheterization on 02/21/2022 with Dr. Scot Dock.   They recently noticed a strange blister that appeared on her right foot. Her daughter also noticed more skin discoloration underneath her right great toe. She also has multiple slow-healing lesions on her bilateral LE, including one laceration from a fall on 09/07/21. They had a home health nurse start recently who is assisting with wound care. In the past week she has been doing better with keeping her legs elevated.  In clinic today her blood pressure is 80/72. Her daughter notes that they did not have a good breakfast this morning. She was unable to keep down a meal from [place] earlier. Also, it was thought her blood pressure is lower because of being on multiple antibiotics. At one time she suffered from a skin reaction thought to be caused by an interaction between an antibiotic and warfarin. For a time her blood pressure was better controlled on 100 mg losartan, as they noticed blood pressures in the 130's.  She denies any palpitations, or chest pain. No lightheadedness, headaches, syncope, orthopnea, or PND.   Past Medical History:  Diagnosis Date   Anticoagulant long-term use    Arrhythmia    Arthritis    Atrial fibrillation (HCC)    Congestive heart failure (  CHF) ([place])    Dysphagia    Esophageal reflux    Essential hypertension    Hiatal hernia    Pulmonary embolus ([place])    Thyroid disease     Past Surgical History:  Procedure Laterality Date   AMPUTATION TOE Right 11/17/2021   Procedure: RIGHT SECOND TOE AMPUTATION;  Surgeon: Wylene Simmer, MD;   Location: [facility];  Service: Orthopedics;  Laterality: Right;   COLONOSCOPY     ELBOW SURGERY     LAPAROSCOPIC HYSTERECTOMY      Current Medications: Current Outpatient Medications on File Prior to Visit  Medication Sig   acetaminophen (TYLENOL) 325 MG tablet Take 650 mg by mouth every 6 (six) hours as needed for moderate pain.   Apoaequorin (PREVAGEN PO) Take 1 capsule by mouth at bedtime.   bumetanide (BUMEX) 1 MG tablet Take 1 tablet (1 mg total) by mouth daily. May take an additional tablet as needed for weight gain of 3 pounds overnight or 5 pounds in one week.   carboxymethylcellulose (REFRESH PLUS) 0.5 % SOLN 1 drop in the morning and at bedtime.   cholecalciferol (VITAMIN D3) 25 MCG (1000 UT) tablet Take 1,000 Units by mouth daily.   clotrimazole-betamethasone (LOTRISONE) cream Apply 1 application. topically 2 (two) times daily as needed (rash/irritation).   diclofenac Sodium (VOLTAREN) 1 % GEL Apply 2 g topically 4 (four) times daily as needed (pain).   gabapentin (NEURONTIN) 300 MG capsule Take 300 mg by mouth 2 (two) times daily.    levothyroxine (SYNTHROID) 100 MCG tablet Take 100 mcg by mouth every morning.   losartan (COZAAR) 100 MG tablet Take 1 tablet (100 mg total) by mouth daily.   Menthol, Topical Analgesic, (BIOFREEZE) 10 % CREA Apply 1 application. topically daily as needed (pain).   metoprolol succinate (TOPROL-XL) 50 MG 24 hr tablet TAKE 1 TABLET BY MOUTH EVERY DAY WITH OR IMMEDIATELY FOLLOWING A MEAL   Multiple Vitamins-Minerals (PRESERVISION/LUTEIN) CAPS Take 1 capsule by mouth 2 (two) times daily.    pantoprazole (PROTONIX) 40 MG tablet Take 40 mg by mouth daily.   senna-docusate (SENOKOT-S) 8.6-50 MG tablet Take 1 tablet by mouth 2 (two) times daily between meals as needed for moderate constipation or mild constipation.   spironolactone (ALDACTONE) 25 MG tablet Take 1 tablet (25 mg total) by mouth daily with breakfast.   vitamin B-12 (CYANOCOBALAMIN) 1000 MCG tablet  Take 1,000 mcg by mouth daily.   warfarin (COUMADIN) 1 MG tablet Take 1-2 mg by mouth See admin instructions. Take 1mg  by mouth on Mondays, Wednesdays, and Fridays. Then take two of the 1 mg tablets total of 2 mg by mouth on Tuesdays, Thursdays, Saturdays, and Sundays   No current facility-administered medications on file prior to visit.     Allergies:   Acetaminophen, Amoxicillin, Azithromycin, Codeine, Erythromycin, Erythromycin ethylsuccinate, Green dyes, Iodine, Misc. sulfonamide containing compounds, Oxycodone, Oxycodone-acetaminophen, Penicillins, Sulfasalazine, and Tramadol   Social History   Tobacco Use   Smoking status: Former    Packs/day: 1.00    Types: Cigarettes    Quit date: 10/02/2001    Years since quitting: 20.3   Smokeless tobacco: Never   Tobacco comments:    former smoker  Vaping Use   Vaping Use: Never used     Family History: The patient's family history includes Cancer in her brother; Stroke in her mother.  ROS:   Please see the history of present illness.   (+) Bilateral LE edema (+) Erythematous/purplish skin discoloration of bilateral LE,  R>L (+) Fatigue Additional pertinent ROS otherwise unremarkable.  EKGs/Labs/Other Studies Reviewed:    The following studies were reviewed today: See cardiac history, above  Bilateral LE Arterial Doppler 01/30/2022: Summary:  Right: Limited visualization due to involuntary movement and swelling.  Elevated velocities in the proximal/mid superficial femoral artery  suggesting 30-49% stenosis. Probable tibial occlusive disease - Posterior  tibial artery appears occluded with  reconstitution of flow in the distal calf. Peroneal artery unable to be  visualized.   Left: Limited visualization due to involuntary movement and swelling.  Elevated velocities in the proximal/mid superficial femoral artery  suggesting 50-74% stenosis. Probable tibial occlusive disease - Posterior  tibial artery appears occluded. Peroneal   artery unable to be visualized.   ABI Doppler 01/30/2022: Summary:  Right: Resting right ankle-brachial index indicates moderate right lower  extremity arterial disease.   Left: Resting left ankle-brachial index indicates mild left lower  extremity arterial disease. The left toe-brachial index is abnormal.    EKG:  EKG is personally reviewed.   02/13/2022:  atrial fibrillation at 44 bpm, iRBBB 02/05/2021: Atrial fibrillation, Rate 54 bpm, iRBBB 01/05/20: Afib at 45 bpm  Recent Labs: 11/16/2021: ALT 18 11/21/2021: BUN 20; Creatinine, Ser 1.29; Creatinine, Ser 1.24; Hemoglobin 11.4; Magnesium 1.7; Platelets 195; Potassium 4.1; Sodium 127   Recent Lipid Panel No results found for: "CHOL", "TRIG", "HDL", "CHOLHDL", "VLDL", "LDLCALC", "LDLDIRECT"  Physical Exam:    VS:  BP (!) 80/72   Pulse (!) 44   Ht $R'5\' 2"'QH$  (1.575 m)   Wt 181 lb (82.1 kg)   SpO2 96%   BMI 33.11 kg/m     Wt Readings from Last 3 Encounters:  02/13/22 181 lb (82.1 kg)  02/06/22 181 lb 6.4 oz (82.3 kg)  12/17/21 184 lb (83.5 kg)   GEN: Well nourished, well developed in no acute distress HEENT: Normal, moist mucous membranes NECK: No JVD CARDIAC: slow, irregularly irregular rhythm, normal S1 and S2, no rubs or gallops. 2/6 systolic murmur. VASCULAR: Radial and DP pulses 2+ bilaterally. No carotid bruits RESPIRATORY:  Clear to auscultation without rales, wheezing or rhonchi  ABDOMEN: Soft, non-tender, non-distended MUSCULOSKELETAL: moves all 4 limbs independently SKIN: Warm and dry, trace bilateral LE edema, Erythematous/purplish skin discoloration of bilateral LE (R>L) with dry wound on R great toe NEUROLOGIC:  Alert and oriented x 3. No focal neuro deficits noted. PSYCHIATRIC:  Normal affect   ASSESSMENT:    1. Permanent atrial fibrillation ([place])   2. Peripheral vascular disease, unspecified ([place])   3. Essential hypertension   4. Chronic diastolic heart failure ([place])   5. Anticoagulant long-term use   6.  Moderate to severe pulmonary hypertension ([place])   7. Secondary hypercoagulable state ([place])     PLAN:    Peripheral edema 2/2 severe pulmonary hypertension and chronic diastolic heart failure -has stabilized significantly. Daughter very involved in her care. Weights and symptoms stable. -Continue bumex and spironolactone as noted -continue daily weights, leg elevation, compression stockings, salt avoidance -we have extensively discussed her pulmonary hypertension, see prior notes -overall leg swelling significantly improved since when I first met her. They feel there is a good balance of avoiding swelling and allowing her to have good quality of life (diet, frequency of urination, etc).  Hypertension: low today -continue diuretics as above -metoprolol succinate, losartan range given below  If Blood pressure consistently >150/90 -->call me If blood pressure 120-159/70-90  --> no change to losartan If blood pressure 110-120 on the top -->cut losartan back  to 50 mg If blood pressure consistently <110 -->stop losartan and call me  If heart rate ~60 bpm on average, no change to metoprolol If heart rate 50s --> cut to 1/2 pill (25 mg) twice a day If heart rate consistently less than 50 --> stop metoprolol and call me   Atrial fibrillation, permanent Slow ventricular response -CHA2DS2/VAS=at least 5 -on coumadin, managed per PCP.  -see above re: metoprolol  Lower extremity wound Concern for significant PAD -following with Dr. Scot Dock, planned for arteriogram  Plan for follow up: 3 mos or sooner as needed  Total time of encounter: 50 minutes total time of encounter, including 43 minutes spent in face-to-face patient care. This time includes coordination of care and counseling regarding above conditions. Remainder of non-face-to-face time involved reviewing chart documents/testing relevant to the patient encounter and documentation in the medical record.  Buford Dresser, MD,  PhD, [facility]    Medication Adjustments/Labs and Tests Ordered: Current medicines are reviewed at length with the patient today.  Concerns regarding medicines are outlined above.   Orders Placed This Encounter  Procedures   EKG 12-Lead   No orders of the defined types were placed in this encounter.  Patient Instructions  Medication Instructions:  Today's vital signs are while she was sleeping--not super useful. If the home nurses check, I am interested in blood pressure and heart rate. With her poor circulation, I'm ok with the blood pressure being a little higher (but not too high). I also don't want her heart rate to be too low.  If Blood pressure consistently >150/90 -->call me If blood pressure 120-159/70-90  --> no change to losartan If blood pressure 110-120 on the top -->cut losartan back to 50 mg If blood pressure consistently <110 -->stop losartan and call me  If heart rate ~60 bpm on average, no change to metoprolol If heart rate 50s --> cut to 1/2 pill (25 mg) twice a day If heart rate consistently less than 50 --> stop metoprolol and call me  *If you need a refill on your cardiac medications before your next appointment, please call your pharmacy*   Lab Work: None ordered today   Testing/Procedures: None ordered today   Follow-Up: At [facility], you and your health needs are our priority.  As part of our continuing mission to provide you with exceptional heart care, we have created designated Provider Care Teams.  These Care Teams include your primary Cardiologist (physician) and Advanced Practice Providers (APPs -  Physician Assistants and Nurse Practitioners) who all work together to provide you with the care you need, when you need it.  We recommend signing up for the patient portal called "MyChart".  Sign up information is provided on this After Visit Summary.  MyChart is used to connect with patients for Virtual Visits  (Telemedicine).  Patients are able to view lab/test results, encounter notes, upcoming appointments, etc.  Non-urgent messages can be sent to your provider as well.   To learn more about what you can do with MyChart, go to [email or web address].    Your next appointment:   3 month(s)  The format for your next appointment:   In Person  Provider:   Buford Dresser, MD{            I,Mathew Stumpf,acting as a scribe for Buford Dresser, MD.,have documented all relevant documentation on the behalf of Buford Dresser, MD,as directed by  Buford Dresser, MD while in the presence of Buford Dresser, MD.  I, Buford Dresser, MD, have reviewed all documentation for this visit. The documentation on 02/13/22 for the exam, diagnosis, procedures, and orders are all accurate and complete.  Signed, Buford Dresser, MD PhD 02/13/2022  [place]

## 2022-02-18 ENCOUNTER — Encounter (HOSPITAL_BASED_OUTPATIENT_CLINIC_OR_DEPARTMENT_OTHER): Payer: Medicare HMO | Admitting: Internal Medicine

## 2022-02-18 DIAGNOSIS — L97512 Non-pressure chronic ulcer of other part of right foot with fat layer exposed: Secondary | ICD-10-CM

## 2022-02-18 DIAGNOSIS — I872 Venous insufficiency (chronic) (peripheral): Secondary | ICD-10-CM

## 2022-02-18 DIAGNOSIS — M869 Osteomyelitis, unspecified: Secondary | ICD-10-CM | POA: Diagnosis not present

## 2022-02-18 NOTE — Progress Notes (Signed)
[name], [name] ([id number]) Visit Report for 01/28/2022 Allergy List Details Patient Name: Date of Service: [name], [name]. 01/28/2022 9:00 A M Medical Record Number: [id number] Patient Account Number: [id number] Date of Birth/Sex: Treating RN: [birth year] (88 y.o. [name] Primary Care Grissel Tyrell: [name] Other Clinician: Referring Mahonri Seiden: Treating Osie Merkin/Extender: [name] Weeks in Treatment: 0 Allergies Active Allergies penicillin erythromycin base green dyes Type: Food iodine sulfasalazine tramadol oxycodone codeine Allergy Notes Electronic Signature(s) Signed: 01/28/2022 4:34:51 PM By: Shawn Stall RN, BSN Entered By: Shawn Stall on 01/28/2022 10:18:03 -------------------------------------------------------------------------------- Arrival Information Details Patient Name: Date of Service: [name]. 01/28/2022 9:00 A M Medical Record Number: [id number] Patient Account Number: [id number] Date of Birth/Sex: Treating RN: 1927/09/21 (88 y.o. F) Primary Care Khary Schaben: [name] Other Clinician: Referring Rainn Zupko: Treating Firas Guardado/Extender: [name] in Treatment: 0 Visit Information Patient Arrived: Wheel Chair Arrival Time: 09:29 Accompanied By: daughter Transfer Assistance: Manual Patient Identification Verified: Yes Secondary Verification Process Completed: Yes Patient Requires Transmission-Based Precautions: No Patient Has Alerts: Yes Patient Alerts: Patient on Blood Thinner Electronic Signature(s) Signed: 02/18/2022 8:46:54 AM By: Thayer Dallas Entered By: Thayer Dallas on 01/28/2022 09:31:44 -------------------------------------------------------------------------------- Clinic Level of Care Assessment Details Patient Name: Date of Service: [name], [name] 01/28/2022 9:00 A M Medical Record Number: [id number] Patient Account Number: [id number] Date of Birth/Sex: Treating  RN: March 25, 1928 (88 y.o. [name] Primary Care Elianys Conry: [name] Other Clinician: Referring Filiberto Wamble: Treating Hayleigh Bawa/Extender: [name] in Treatment: 0 Clinic Level of Care Assessment Items TOOL 1 Quantity Score X- 1 0 Use when EandM and Procedure is performed on INITIAL visit ASSESSMENTS - Nursing Assessment / Reassessment X- 1 20 General Physical Exam (combine w/ comprehensive assessment (listed just below) when performed on new pt. evals) X- 1 25 Comprehensive Assessment (HX, ROS, Risk Assessments, Wounds Hx, etc.) ASSESSMENTS - Wound and Skin Assessment / Reassessment X- 1 10 Dermatologic / Skin Assessment (not related to wound area) ASSESSMENTS - Ostomy and/or Continence Assessment and Care []  - 0 Incontinence Assessment and Management []  - 0 Ostomy Care Assessment and Management (repouching, etc.) PROCESS - Coordination of Care []  - 0 Simple Patient / Family Education for ongoing care X- 1 20 Complex (extensive) Patient / Family Education for ongoing care X- 1 10 Staff obtains , Records, T Results / Process Orders est X- 1 10 Staff telephones HHA, Nursing Homes / Clarify orders / etc []  - 0 Routine Transfer to another Facility (non-emergent condition) []  - 0 Routine Hospital Admission (non-emergent condition) X- 1 15 New Admissions / / Ordering NPWT Apligraf, etc. , []  - 0 Emergency Hospital Admission (emergent condition) PROCESS - Special Needs []  - 0 Pediatric / Minor Patient Management []  - 0 Isolation Patient Management []  - 0 Hearing / Language / Visual special needs []  - 0 Assessment of Community assistance (transportation, D/C planning, etc.) []  - 0 Additional assistance / Altered mentation []  - 0 Support Surface(s) Assessment (bed, cushion, seat, etc.) INTERVENTIONS - Miscellaneous []  - 0 External ear exam []  - 0 Patient Transfer (multiple staff / /  Similar devices) []  - 0 Simple Staple / Suture removal (25 or less) []  - 0 Complex Staple / Suture removal (26 or more) []  - 0 Hypo/Hyperglycemic Management (do not check if billed separately) X- 1 15 Ankle / Brachial Index (ABI) - do not check if billed separately Has the patient been seen at the hospital within  the last three years: Yes Total Score: 125 Level Of Care: New/Established - Level 4 Electronic Signature(s) Signed: 01/28/2022 4:34:51 PM By: Shawn Stalleaton, Bobbi RN, BSN Entered By: Shawn Stalleaton, Bobbi on 01/28/2022 10:42:52 -------------------------------------------------------------------------------- Encounter Discharge Information Details Patient Name: Date of Service: Megan ReapHILL, [name] 01/28/2022 9:00 A M Medical Record Number: [id number] Patient Account Number: [id number] Date of Birth/Sex: Treating RN: 07/07/1928 (88 y.o. Debara PickettF) Deaton, Yvonne KendallBobbi Primary Care Shyasia Funches: Mila PalmerWolters, Sharon Other Clinician: Referring Pamlea Finder: Treating Goble Fudala/Extender: Burman RiisHoffman, Jessica Wolters, Sharon Weeks in Treatment: 0 Encounter Discharge Information Items Post Procedure Vitals Discharge Condition: Stable Temperature (F): 98.1 Ambulatory Status: Wheelchair Pulse (bpm): 61 Discharge Destination: Home Respiratory Rate (breaths/min): 18 Transportation: Private Auto Blood Pressure (mmHg): 112/52 Accompanied By: daughter Schedule Follow-up Appointment: Yes Clinical Summary of Care: Electronic Signature(s) Signed: 01/28/2022 4:34:51 PM By: Shawn Stalleaton, Bobbi RN, BSN Entered By: Shawn Stalleaton, Bobbi on 01/28/2022 10:43:37 -------------------------------------------------------------------------------- Lower Extremity Assessment Details Patient Name: Date of Service: Megan ReapHILL, [name] 01/28/2022 9:00 A M Medical Record Number: [id number] Patient Account Number: [id number] Date of Birth/Sex: Treating RN: 06/02/1928 (88 y.o. Debara PickettF) Deaton, Millard.LoaBobbi Primary Care Jeanette Rauth: Mila PalmerWolters, Sharon Other Clinician: Referring  Ellakate Gonsalves: Treating Mubarak Bevens/Extender: Sallee ProvencalHoffman, Jessica Wolters, Sharon Weeks in Treatment: 0 Edema Assessment Assessed: Kyra Searles[Left: Yes] Franne Forts[Right: Yes] Edema: [Left: Yes] [Right: Yes] Calf Left: Right: Point of Measurement: 30 cm From Medial Instep 42 cm 44.5 cm Ankle Left: Right: Point of Measurement: 9 cm From Medial Instep 22 cm 23 cm Knee To Floor Left: Right: From Medial Instep 37 cm 37 cm Vascular Assessment Pulses: Dorsalis Pedis Palpable: [Left:Yes] [Right:Yes] Doppler Audible: [Left:Yes] [Right:Yes] Posterior Tibial Palpable: [Left:Yes] [Right:Yes] Doppler Audible: [Left:Yes] [Right:Yes] Blood Pressure: Brachial: [Left:112] [Right:112] Ankle: [Left:Dorsalis Pedis: 170 1.52] [Right:Dorsalis Pedis: 140 1.25] Notes monophasic pulses noted to all wound DP and TP. Drayson Dorko made aware. Electronic Signature(s) Signed: 01/28/2022 4:34:51 PM By: Shawn Stalleaton, Bobbi RN, BSN Entered By: Shawn Stalleaton, Bobbi on 01/28/2022 10:02:06 -------------------------------------------------------------------------------- Multi Wound Chart Details Patient Name: Date of Service: Megan ReapHILL, [name] 01/28/2022 9:00 A M Medical Record Number: [id number] Patient Account Number: [id number] Date of Birth/Sex: Treating RN: 10/17/1927 (88 y.o. F) Primary Care Antha Niday: Mila PalmerWolters, Sharon Other Clinician: Referring Felita Bump: Treating Burwell Bethel/Extender: Burman RiisHoffman, Jessica Wolters, Sharon Weeks in Treatment: 0 Vital Signs Height(in): 62 Pulse(bpm): 61 Weight(lbs): 181 Blood Pressure(mmHg): 112/52 Body Mass Index(BMI): 33.1 Temperature(F): 98.1 Respiratory Rate(breaths/min): 18 Photos: Right, Lateral Lower Leg Right Amputation Site - Toe Left, Anterior Lower Leg Wound Location: Bump Surgical Injury Bump Wounding Event: Abrasion Open Surgical Wound Abrasion Primary Etiology: Lymphedema N/A Lymphedema Secondary Etiology: 01/14/2022 11/17/2021 01/14/2022 Date Acquired: 0 0 0 Weeks of Treatment: Open Open  Open Wound Status: No No No Wound Recurrence: 0.4x0.5x0.1 1.1x0.5x0.5 0.5x0.7x0.1 Measurements L x W x D (cm) 0.157 0.432 0.275 A (cm) : rea 0.016 0.216 0.027 Volume (cm) : 0.00% N/A N/A % Reduction in A rea: 0.00% N/A N/A % Reduction in Volume: Full Thickness Without Exposed Full Thickness Without Exposed Full Thickness Without Exposed Classification: Support Structures Support Structures Support Structures Medium Medium Medium Exudate Amount: Serosanguineous Serosanguineous Serosanguineous Exudate Type: red, brown red, brown red, brown Exudate Color: Distinct, outline attached Epibole Distinct, outline attached Wound Margin: Large (67-100%) None Present (0%) Large (67-100%) Granulation Amount: Pink, Pale N/A Red Granulation Quality: None Present (0%) Large (67-100%) None Present (0%) Necrotic Amount: Fat Layer (Subcutaneous Tissue): Yes Fat Layer (Subcutaneous Tissue): Yes Fat Layer (Subcutaneous Tissue): Yes Exposed Structures: Fascia: No Fascia: No Fascia: No Tendon: No Tendon: No Tendon: No Muscle: No Muscle: No  Muscle: No Joint: No Joint: No Joint: No Bone: No Bone: No Bone: No Medium (34-66%) None Large (67-100%) Epithelialization: Chemical/Enzymatic/Mechanical Debridement - Excisional Chemical/Enzymatic/Mechanical Debridement: Pre-procedure Verification/Time Out N/A 10:20 N/A Taken: N/A Other N/A Pain Control: N/A Subcutaneous, Slough N/A Tissue Debrided: N/A Skin/Subcutaneous Tissue N/A Level: N/A 0.55 N/A Debridement A (sq cm): rea N/A Curette, N/A N/A Instrument: None None None Bleeding: Procedure was tolerated well Procedure was tolerated well Procedure was tolerated well Debridement Treatment Response: 0.4x0.5x0.1 1.1x0.5x0.5 0.5x0.7x0.1 Post Debridement Measurements L x W x D (cm) 0.016 0.216 0.027 Post Debridement Volume: (cm) Debridement Debridement Debridement Procedures Performed: Treatment Notes Wound #2 (Lower Leg)  Wound Laterality: Right, Lateral Cleanser Soap and Water Discharge Instruction: May shower and wash wound with dial antibacterial soap and water prior to dressing change. Wound Cleanser Discharge Instruction: Cleanse the wound with wound cleanser prior to applying a clean dressing using gauze sponges, not tissue or cotton balls. Peri-Wound Care Topical bacitracin ointment Discharge Instruction: Apply directly to wound bed. Primary Dressing Secondary Dressing Zetuvit Plus Silicone Border Dressing 4x4 (in/in) Discharge Instruction: Apply silicone border or bandaids. Secured With Compression Wrap Compression Stockings Add-Ons Wound #3 (Amputation Site - Toe) Wound Laterality: Right Cleanser Soap and Water Discharge Instruction: May shower and wash wound with dial antibacterial soap and water prior to dressing change. Wound Cleanser Discharge Instruction: Cleanse the wound with wound cleanser prior to applying a clean dressing using gauze sponges, not tissue or cotton balls. Peri-Wound Care Topical Primary Dressing Dakin's Solution 0.25%, 16 (oz) Discharge Instruction: Moisten gauze with Dakin's solution Secondary Dressing Woven Gauze Sponge, Non-Sterile 4x4 in Discharge Instruction: Apply over primary dressing as directed. Secured With Conforming Stretch Gauze Bandage, Sterile 2x75 (in/in) Discharge Instruction: Secure with stretch gauze as directed. Paper Tape, 2x10 (in/yd) Discharge Instruction: Secure dressing with tape as directed. Compression Wrap Compression Stockings Add-Ons Wound #4 (Lower Leg) Wound Laterality: Left, Anterior Cleanser Soap and Water Discharge Instruction: May shower and wash wound with dial antibacterial soap and water prior to dressing change. Wound Cleanser Discharge Instruction: Cleanse the wound with wound cleanser prior to applying a clean dressing using gauze sponges, not tissue or cotton balls. Peri-Wound Care Topical bacitracin  ointment Discharge Instruction: Apply directly to wound bed. Primary Dressing Secondary Dressing Zetuvit Plus Silicone Border Dressing 4x4 (in/in) Discharge Instruction: Apply silicone border or bandaids. Secured With Compression Wrap Compression Stockings Add-Ons Electronic Signature(s) Signed: 01/28/2022 12:39:58 PM By: Geralyn Corwin DO Entered By: Geralyn Corwin on 01/28/2022 12:19:55 -------------------------------------------------------------------------------- Multi-Disciplinary Care Plan Details Patient Name: Date of Service: [name]. 01/28/2022 9:00 A M Medical Record Number: [id number] Patient Account Number: [id number] Date of Birth/Sex: Treating RN: October 08, 1927 (88 y.o. [name], [name] Primary Care Karlton Maya: [name] Other Clinician: Referring Izamar Linden: Treating Wayden Schwertner/Extender: [name] in Treatment: 0 Active Inactive Nutrition Nursing Diagnoses: Potential for alteratiion in Nutrition/Potential for imbalanced nutrition Goals: Patient/caregiver agrees to and verbalizes understanding of need to use nutritional supplements and/or vitamins as prescribed Date Initiated: 01/28/2022 Target Resolution Date: 02/14/2022 Goal Status: Active Interventions: Assess patient nutrition upon admission and as needed per policy Provide education on nutrition Treatment Activities: Patient referred to Primary Care Physician for further nutritional evaluation : 01/28/2022 Notes: Orientation to the Wound Care Program Nursing Diagnoses: Knowledge deficit related to the wound healing center program Goals: Patient/caregiver will verbalize understanding of the Wound Healing Center Program Date Initiated: 01/28/2022 Target Resolution Date: 02/13/2022 Goal Status: Active Interventions: Provide education on orientation to the wound center  Notes: Pain, Acute or Chronic Nursing Diagnoses: Pain, acute or chronic: actual or  potential Potential alteration in comfort, pain Goals: Patient will verbalize adequate pain control and receive pain control interventions during procedures as needed Date Initiated: 01/28/2022 Target Resolution Date: 02/13/2022 Goal Status: Active Interventions: Encourage patient to take pain medications as prescribed Provide education on pain management Treatment Activities: Administer pain control measures as ordered : 01/28/2022 Notes: Wound/Skin Impairment Nursing Diagnoses: Knowledge deficit related to ulceration/compromised skin integrity Goals: Patient/caregiver will verbalize understanding of skin care regimen Date Initiated: 01/28/2022 Target Resolution Date: 02/14/2022 Goal Status: Active Interventions: Assess patient/caregiver ability to perform ulcer/skin care regimen upon admission and as needed Assess ulceration(s) every visit Provide education on ulcer and skin care Treatment Activities: Skin care regimen initiated : 01/28/2022 Topical wound management initiated : 01/28/2022 Notes: Electronic Signature(s) Signed: 01/28/2022 4:34:51 PM By: Shawn Stall RN, BSN Entered By: Shawn Stall on 01/28/2022 10:21:50 -------------------------------------------------------------------------------- Pain Assessment Details Patient Name: Date of Service: [name]. 01/28/2022 9:00 A M Medical Record Number: [id number] Patient Account Number: [id number] Date of Birth/Sex: Treating RN: [birth year] (88 y.o. [name] Primary Care Miloh Alcocer: [name] Other Clinician: Referring Symphoni Helbling: Treating Donovan Persley/Extender: [name] in Treatment: 0 Active Problems Location of Pain Severity and Description of Pain Patient Has Paino No Site Locations Rate the pain. Current Pain Level: 0 Pain Management and Medication Current Pain Management: Medication: No Cold Application: No Rest: No Massage: No Activity: No T.E.N.S.: No Heat  Application: No Leg drop or elevation: No Is the Current Pain Management Adequate: Adequate How does your wound impact your activities of daily livingo Sleep: No Bathing: No Appetite: No Relationship With Others: No Bladder Continence: No Emotions: No Bowel Continence: No Work: No Toileting: No Drive: No Dressing: No Hobbies: No Psychologist, prison and probation services) Signed: 01/28/2022 4:34:51 PM By: Shawn Stall RN, BSN Entered By: Shawn Stall on 01/28/2022 09:53:22 -------------------------------------------------------------------------------- Patient/Caregiver Education Details Patient Name: Date of Service: [name], [name] 5/23/2023andnbsp9:00 A M Medical Record Number: [id number] Patient Account Number: [id number] Date of Birth/Gender: Treating RN: 09-Nov-1927 (88 y.o. [name], [name] Primary Care Physician: [name] Other Clinician: Referring Physician: Treating Physician/Extender: [name] in Treatment: 0 Education Assessment Education Provided To: Patient and Caregiver daughter Education Topics Provided Welcome [facility]: o Handouts: Welcome [facility] o Methods: Explain/Verbal, Printed Responses: Reinforcements needed Electronic Signature(s) Signed: 01/28/2022 4:34:51 PM By: Shawn Stall RN, BSN Entered By: Shawn Stall on 01/28/2022 10:22:12 -------------------------------------------------------------------------------- Wound Assessment Details Patient Name: Date of Service: [name]. 01/28/2022 9:00 A M Medical Record Number: [id number] Patient Account Number: [id number] Date of Birth/Sex: Treating RN: [birth year] (88 y.o. [name], [name] Primary Care Coleman Kalas: [name] Other Clinician: Referring Darek Eifler: Treating Giavana Rooke/Extender: [name] Weeks in Treatment: 0 Wound Status Wound Number: 2 Primary Etiology: Abrasion Wound Location: Right, Lateral Lower  Leg Secondary Etiology: Lymphedema Wounding Event: Bump Wound Status: Open Date Acquired: 01/14/2022 Weeks Of Treatment: 0 Clustered Wound: No Photos Wound Measurements Length: (cm) 0.4 Width: (cm) 0.5 Depth: (cm) 0.1 Area: (cm) 0.157 Volume: (cm) 0.016 % Reduction in Area: 0% % Reduction in Volume: 0% Epithelialization: Medium (34-66%) Tunneling: No Undermining: No Wound Description Classification: Full Thickness Without Exposed Support Structures Wound Margin: Distinct, outline attached Exudate Amount: Medium Exudate Type: Serosanguineous Exudate Color: red, brown Foul Odor After Cleansing: No Slough/Fibrino No Wound Bed Granulation Amount: Large (67-100%) Exposed Structure Granulation Quality: Pink,  Pale Fascia Exposed: No Necrotic Amount: None Present (0%) Fat Layer (Subcutaneous Tissue) Exposed: Yes Tendon Exposed: No Muscle Exposed: No Joint Exposed: No Bone Exposed: No Electronic Signature(s) Signed: 01/28/2022 4:34:51 PM By: Shawn Stall RN, BSN Entered By: Shawn Stall on 01/28/2022 09:49:23 -------------------------------------------------------------------------------- Wound Assessment Details Patient Name: Date of Service: [name]. 01/28/2022 9:00 A M Medical Record Number: [id number] Patient Account Number: [id number] Date of Birth/Sex: Treating RN: 1928-08-24 (88 y.o. [name], [name] Primary Care Francena Zender: [name] Other Clinician: Referring Chandni Gagan: Treating Jenisha Faison/Extender: [name] Weeks in Treatment: 0 Wound Status Wound Number: 3 Primary Etiology: Open Surgical Wound Wound Location: Right Amputation Site - Toe Wound Status: Open Wounding Event: Surgical Injury Date Acquired: 11/17/2021 Weeks Of Treatment: 0 Clustered Wound: No Photos Wound Measurements Length: (cm) 1.1 Width: (cm) 0.5 Depth: (cm) 0.5 Area: (cm) 0.432 Volume: (cm) 0.216 % Reduction in Area: % Reduction in  Volume: Epithelialization: None Tunneling: No Undermining: No Wound Description Classification: Full Thickness Without Exposed Support Structu Wound Margin: Epibole Exudate Amount: Medium Exudate Type: Serosanguineous Exudate Color: red, brown res Foul Odor After Cleansing: No Slough/Fibrino Yes Wound Bed Granulation Amount: None Present (0%) Exposed Structure Necrotic Amount: Large (67-100%) Fascia Exposed: No Necrotic Quality: Adherent Slough Fat Layer (Subcutaneous Tissue) Exposed: Yes Tendon Exposed: No Muscle Exposed: No Joint Exposed: No Bone Exposed: No Electronic Signature(s) Signed: 01/28/2022 4:34:51 PM By: Shawn Stall RN, BSN Entered By: Shawn Stall on 01/28/2022 09:49:07 -------------------------------------------------------------------------------- Wound Assessment Details Patient Name: Date of Service: [name]. 01/28/2022 9:00 A M Medical Record Number: [id number] Patient Account Number: [id number] Date of Birth/Sex: Treating RN: 04/12/1928 (88 y.o. [name], [name] Primary Care Oiva Dibari: [name] Other Clinician: Referring Jayland Null: Treating Anaia Frith/Extender: [name] Weeks in Treatment: 0 Wound Status Wound Number: 4 Primary Etiology: Abrasion Wound Location: Left, Anterior Lower Leg Secondary Etiology: Lymphedema Wounding Event: Bump Wound Status: Open Date Acquired: 01/14/2022 Weeks Of Treatment: 0 Clustered Wound: No Photos Wound Measurements Length: (cm) 0.5 Width: (cm) 0.7 Depth: (cm) 0.1 Area: (cm) 0.275 Volume: (cm) 0.027 % Reduction in Area: % Reduction in Volume: Epithelialization: Large (67-100%) Tunneling: No Undermining: No Wound Description Classification: Full Thickness Without Exposed Support Structures Wound Margin: Distinct, outline attached Exudate Amount: Medium Exudate Type: Serosanguineous Exudate Color: red, brown Foul Odor After Cleansing: No Slough/Fibrino Yes Wound  Bed Granulation Amount: Large (67-100%) Exposed Structure Granulation Quality: Red Fascia Exposed: No Necrotic Amount: None Present (0%) Fat Layer (Subcutaneous Tissue) Exposed: Yes Tendon Exposed: No Muscle Exposed: No Joint Exposed: No Bone Exposed: No Electronic Signature(s) Signed: 01/28/2022 4:34:51 PM By: Shawn Stall RN, BSN Entered By: Shawn Stall on 01/28/2022 09:50:40 -------------------------------------------------------------------------------- Vitals Details Patient Name: Date of Service: [name]. 01/28/2022 9:00 A M Medical Record Number: [id number] Patient Account Number: [id number] Date of Birth/Sex: Treating RN: 1927/10/14 (88 y.o. F) Primary Care Jaleeya Mcnelly: [name] Other Clinician: Referring Carlus Stay: Treating [name] [name]/Extender: [name] in Treatment: 0 Vital Signs Time Taken: 09:31 Temperature (F): 98.1 Height (in): 62 Pulse (bpm): 61 Source: Stated Respiratory Rate (breaths/min): 18 Weight (lbs): 181 Blood Pressure (mmHg): 112/52 Source: Stated Reference Range: 80 - 120 mg / dl Body Mass Index (BMI): 33.1 Electronic Signature(s) Signed: 02/18/2022 8:46:54 AM By: Thayer Dallas Entered By: Thayer Dallas on 01/28/2022 09:32:55

## 2022-02-18 NOTE — Progress Notes (Addendum)
MACIL, STROMGREN ([id number]) Visit Report for 02/11/2022 Arrival Information Details Patient Name: Date of Service: [name], [name] 02/11/2022 1:45 PM Medical Record Number: [id number] Patient Account Number: [id number] Date of Birth/Sex: Treating RN: 01/24/1928 (88 y.o. [name], Tammi Klippel Primary Care Bennye Nix: [name] Other Clinician: Referring Jocelynn Gioffre: Treating Curtis Uriarte/Extender: Cheri Guppy in Treatment: 2 Visit Information History Since Last Visit Added or deleted any medications: No Patient Arrived: Wheel Chair Any new allergies or adverse reactions: No Arrival Time: 14:34 Had a fall or experienced change in No Accompanied By: daughter activities of daily living that may affect Transfer Assistance: Manual risk of falls: Patient Identification Verified: Yes Signs or symptoms of abuse/neglect since last visito No Secondary Verification Process Completed: Yes Hospitalized since last visit: No Patient Requires Transmission-Based Precautions: No Implantable device outside of the clinic excluding No Patient Has Alerts: Yes cellular tissue based products placed in the center Patient Alerts: Patient on Blood Thinner since last visit: Has Dressing in Place as Prescribed: Yes Pain Present Now: No Electronic Signature(s) Signed: 02/18/2022 8:46:10 AM By: Erenest Blank Entered By: Erenest Blank on 02/11/2022 14:36:28 -------------------------------------------------------------------------------- Lower Extremity Assessment Details Patient Name: Date of Service: Megan Bogus. 02/11/2022 1:45 PM Medical Record Number: [id number] Patient Account Number: [id number] Date of Birth/Sex: Treating RN: 08-11-1928 (88 y.o. [name], Tammi Klippel Primary Care Stephana Morell: [name] Other Clinician: Referring Ronne Stefanski: Treating Christmas Faraci/Extender: Cheri Guppy in Treatment: 2 Edema Assessment Assessed: [name]: No] [name]: No] Edema: [Left:  Yes] [Right: Yes] Calf Left: Right: Point of Measurement: 30 cm From Medial Instep 44 cm Ankle Left: Right: Point of Measurement: 9 cm From Medial Instep 25 cm Vascular Assessment Pulses: Dorsalis Pedis Palpable: [Right:Yes] Electronic Signature(s) Signed: 02/11/2022 6:06:53 PM By: Deon Pilling RN, BSN Signed: 02/18/2022 8:46:10 AM By: Erenest Blank Entered By: Erenest Blank on 02/11/2022 14:40:41 -------------------------------------------------------------------------------- Multi Wound Chart Details Patient Name: Date of Service: Megan Bogus. 02/11/2022 1:45 PM Medical Record Number: [id number] Patient Account Number: [id number] Date of Birth/Sex: Treating RN: 1927/10/11 (88 y.o. [name], [name] Primary Care Lysette Lindenbaum: [name] Other Clinician: Referring Jazmeen Axtell: Treating Inis Borneman/Extender: Cheri Guppy in Treatment: 2 Vital Signs Height(in): 36 Pulse(bpm): 5 Weight(lbs): 181 Blood Pressure(mmHg): 154/67 Body Mass Index(BMI): 33.1 Temperature(F): 98.1 Respiratory Rate(breaths/min): 20 Photos: [N/A:N/A] Right Amputation Site - Toe Right T Great oe N/A Wound Location: Surgical Injury Shear/Friction N/A Wounding Event: Open Surgical Wound Abrasion N/A Primary Etiology: Cataracts, Anemia, Congestive Heart Cataracts, Anemia, Congestive Heart N/A Comorbid History: Failure, Hypotension, Peripheral Failure, Hypotension, Peripheral Venous Disease, Osteomyelitis, Venous Disease, Osteomyelitis, Neuropathy Neuropathy 11/17/2021 02/06/2022 N/A Date Acquired: 2 0 N/A Weeks of Treatment: Open Open N/A Wound Status: No No N/A Wound Recurrence: 0.7x0.05x0.1 0.5x0.7x0.1 N/A Measurements L x W x D (cm) 0.027 0.275 N/A A (cm) : rea 0.003 0.027 N/A Volume (cm) : 93.80% 28.60% N/A % Reduction in Area: 98.60% 28.90% N/A % Reduction in Volume: Full Thickness Without Exposed Full Thickness Without Exposed N/A Classification: Support  Structures Support Structures Medium Medium N/A Exudate Amount: Serosanguineous Serosanguineous N/A Exudate Type: red, brown red, brown N/A Exudate Color: Epibole Distinct, outline attached N/A Wound Margin: None Present (0%) Medium (34-66%) N/A Granulation Amount: N/A Pink, Pale N/A Granulation Quality: Large (67-100%) None Present (0%) N/A Necrotic Amount: Fat Layer (Subcutaneous Tissue): Yes Fat Layer (Subcutaneous Tissue): Yes N/A Exposed Structures: Fascia: No Fascia: No Tendon: No Tendon: No Muscle: No Muscle: No Joint: No Joint: No Bone: No Bone: No  None None N/A Epithelialization: Treatment Notes Electronic Signature(s) Signed: 02/11/2022 5:45:31 PM By: Linton Ham MD Signed: 02/11/2022 6:06:53 PM By: Deon Pilling RN, BSN Entered By: Linton Ham on 02/11/2022 15:33:53 -------------------------------------------------------------------------------- Multi-Disciplinary Care Plan Details Patient Name: Date of Service: Megan Bogus. 02/11/2022 1:45 PM Medical Record Number: [id number] Patient Account Number: [id number] Date of Birth/Sex: Treating RN: 1927/10/29 (88 y.o. [name], Tammi Klippel Primary Care Vimal Derego: [name] Other Clinician: Referring Jazmin Vensel: Treating Breindel Collier/Extender: Cheri Guppy in Treatment: 2 Active Inactive Nutrition Nursing Diagnoses: Potential for alteratiion in Nutrition/Potential for imbalanced nutrition Goals: Patient/caregiver agrees to and verbalizes understanding of need to use nutritional supplements and/or vitamins as prescribed Date Initiated: 01/28/2022 Target Resolution Date: 03/07/2022 Goal Status: Active Interventions: Assess patient nutrition upon admission and as needed per policy Provide education on nutrition Treatment Activities: Patient referred to Primary Care Physician for further nutritional evaluation : 01/28/2022 Notes: Pain, Acute or Chronic Nursing Diagnoses: Pain, acute  or chronic: actual or potential Potential alteration in comfort, pain Goals: Patient will verbalize adequate pain control and receive pain control interventions during procedures as needed Date Initiated: 01/28/2022 Target Resolution Date: 03/07/2022 Goal Status: Active Interventions: Encourage patient to take pain medications as prescribed Provide education on pain management Treatment Activities: Administer pain control measures as ordered : 01/28/2022 Notes: Wound/Skin Impairment Nursing Diagnoses: Knowledge deficit related to ulceration/compromised skin integrity Goals: Patient/caregiver will verbalize understanding of skin care regimen Date Initiated: 01/28/2022 Target Resolution Date: 03/07/2022 Goal Status: Active Interventions: Assess patient/caregiver ability to perform ulcer/skin care regimen upon admission and as needed Assess ulceration(s) every visit Provide education on ulcer and skin care Treatment Activities: Skin care regimen initiated : 01/28/2022 Topical wound management initiated : 01/28/2022 Notes: Electronic Signature(s) Signed: 02/11/2022 6:06:53 PM By: Deon Pilling RN, BSN Entered By: Deon Pilling on 02/11/2022 15:04:45 -------------------------------------------------------------------------------- Pain Assessment Details Patient Name: Date of Service: Megan Bogus. 02/11/2022 1:45 PM Medical Record Number: [id number] Patient Account Number: [id number] Date of Birth/Sex: Treating RN: [birth year] (88 y.o. [name], Tammi Klippel Primary Care Meshawn Oconnor: [name] Other Clinician: Referring Cuyler Vandyken: Treating Adarian Bur/Extender: Cheri Guppy in Treatment: 2 Active Problems Location of Pain Severity and Description of Pain Patient Has Paino No Site Locations Pain Management and Medication Current Pain Management: Electronic Signature(s) Signed: 02/11/2022 6:06:53 PM By: Deon Pilling RN, BSN Signed: 02/18/2022 8:46:10 AM By: Erenest Blank Entered By: Erenest Blank on 02/11/2022 14:43:17 -------------------------------------------------------------------------------- Patient/Caregiver Education Details Patient Name: Date of Service: Megan Bogus 6/6/2023andnbsp1:45 PM Medical Record Number: [id number] Patient Account Number: [id number] Date of Birth/Gender: Treating RN: 1928-07-29 (88 y.o. [name] Primary Care Physician: [name] Other Clinician: Referring Physician: Treating Physician/Extender: Cheri Guppy in Treatment: 2 Education Assessment Education Provided To: Patient and Caregiver daughter Education Topics Provided Wound/Skin Impairment: Handouts: Skin Care Do's and Dont's Methods: Explain/Verbal Responses: Reinforcements needed Electronic Signature(s) Signed: 02/11/2022 6:06:53 PM By: Deon Pilling RN, BSN Entered By: Deon Pilling on 02/11/2022 15:04:59 -------------------------------------------------------------------------------- Wound Assessment Details Patient Name: Date of Service: Megan Bogus. 02/11/2022 1:45 PM Medical Record Number: [id number] Patient Account Number: [id number] Date of Birth/Sex: Treating RN: 29-Apr-1928 (88 y.o. [name] Primary Care Brandee Markin: [name] Other Clinician: Referring Yerlin Gasparyan: Treating Rogen Porte/Extender: Cheri Guppy in Treatment: 2 Wound Status Wound Number: 3 Primary Open Surgical Wound Etiology: Wound Location: Right Amputation Site - Toe Wound Open Wounding Event: Surgical Injury Status: Date Acquired: 11/17/2021 Comorbid Cataracts, Anemia, Congestive Heart  Failure, Hypotension, Weeks Of Treatment: 2 History: Peripheral Venous Disease, Osteomyelitis, Neuropathy Clustered Wound: No Photos Wound Measurements Length: (cm) 0.7 Width: (cm) 0.05 Depth: (cm) 1 Area: (cm) 0.027 Volume: (cm) 0.027 % Reduction in Area: 93.8% % Reduction in Volume:  87.5% Epithelialization: None Tunneling: No Undermining: No Wound Description Classification: Full Thickness Without Exposed Support Structu Wound Margin: Epibole Exudate Amount: Medium Exudate Type: Serosanguineous Exudate Color: red, brown Wound Bed Granulation Amount: None Present (0%) Necrotic Amount: Large (67-100%) Necrotic Quality: Adherent Slough res Foul Odor After Cleansing: No Slough/Fibrino Yes Exposed Structure Fascia Exposed: No Fat Layer (Subcutaneous Tissue) Exposed: Yes Tendon Exposed: No Muscle Exposed: No Joint Exposed: No Bone Exposed: No Electronic Signature(s) Signed: 02/18/2022 3:13:09 PM By: Deon Pilling RN, BSN Previous Signature: 02/11/2022 6:06:53 PM Version By: Deon Pilling RN, BSN Previous Signature: 02/18/2022 8:46:10 AM Version By: Erenest Blank Entered By: Deon Pilling on 02/18/2022 15:13:09 -------------------------------------------------------------------------------- Wound Assessment Details Patient Name: Date of Service: Megan Bogus. 02/11/2022 1:45 PM Medical Record Number: [id number] Patient Account Number: [id number] Date of Birth/Sex: Treating RN: 25-Apr-1928 (88 y.o. [name], [name] Primary Care Valentin Benney: [name] Other Clinician: Referring Valen Gillison: Treating Ziasia Lenoir/Extender: Cheri Guppy in Treatment: 2 Wound Status Wound Number: 5 Primary Abrasion Etiology: Wound Location: Right T Great oe Wound Open Wounding Event: Shear/Friction Status: Date Acquired: 02/06/2022 Comorbid Cataracts, Anemia, Congestive Heart Failure, Hypotension, Weeks Of Treatment: 0 History: Peripheral Venous Disease, Osteomyelitis, Neuropathy Clustered Wound: No Photos Wound Measurements Length: (cm) 0.5 Width: (cm) 0.7 Depth: (cm) 0.1 Area: (cm) 0.275 Volume: (cm) 0.027 % Reduction in Area: 28.6% % Reduction in Volume: 28.9% Epithelialization: None Tunneling: No Undermining: No Wound  Description Classification: Full Thickness Without Exposed Support Structures Wound Margin: Distinct, outline attached Exudate Amount: Medium Exudate Type: Serosanguineous Exudate Color: red, brown Foul Odor After Cleansing: No Slough/Fibrino No Wound Bed Granulation Amount: Medium (34-66%) Exposed Structure Granulation Quality: Pink, Pale Fascia Exposed: No Necrotic Amount: None Present (0%) Fat Layer (Subcutaneous Tissue) Exposed: Yes Tendon Exposed: No Muscle Exposed: No Joint Exposed: No Bone Exposed: No Electronic Signature(s) Signed: 02/11/2022 6:06:53 PM By: Deon Pilling RN, BSN Signed: 02/18/2022 8:46:10 AM By: Erenest Blank Entered By: Erenest Blank on 02/11/2022 14:45:12 -------------------------------------------------------------------------------- Vitals Details Patient Name: Date of Service: Megan Bogus. 02/11/2022 1:45 PM Medical Record Number: [id number] Patient Account Number: [id number] Date of Birth/Sex: Treating RN: February 10, 1928 (88 y.o. [name], [name] Primary Care Fantasia Jinkins: [name] Other Clinician: Referring Veleta Yamamoto: Treating Kassius Battiste/Extender: Cheri Guppy in Treatment: 2 Vital Signs Time Taken: 14:36 Temperature (F): 98.1 Height (in): 62 Pulse (bpm): 55 Weight (lbs): 181 Respiratory Rate (breaths/min): 20 Body Mass Index (BMI): 33.1 Blood Pressure (mmHg): 154/67 Reference Range: 80 - 120 mg / dl Electronic Signature(s) Signed: 02/18/2022 8:46:10 AM By: Erenest Blank Entered By: Erenest Blank on 02/11/2022 14:42:01

## 2022-02-18 NOTE — Progress Notes (Addendum)
Megan, [name] ([id number]) Visit Report for 02/18/2022 Chief Complaint Document Details Patient Name: Date of Service: [name], [name]. 02/18/2022 2:15 PM Medical Record Number: [id number] Patient Account Number: [id number] Date of Birth/Sex: Treating RN: Mar 27, 1928 (88 y.o. [name] Primary Care Provider: [name] Other Clinician: Referring Provider: Treating Provider/Extender: [name] in Treatment: 3 Information Obtained from: Patient Chief Complaint 01/28/2022; right second toe amputation site dehiscence and bilateral lower extremity wounds. Electronic Signature(s) Signed: 02/18/2022 5:27:49 PM By: Geralyn Corwin DO Entered By: Geralyn Corwin on 02/18/2022 16:50:24 -------------------------------------------------------------------------------- Debridement Details Patient Name: Date of Service: [name]. 02/18/2022 2:15 PM Medical Record Number: [id number] Patient Account Number: [id number] Date of Birth/Sex: Treating RN: September 14, 1927 (88 y.o. [name], [name] Primary Care Provider: [name] Other Clinician: Referring Provider: Treating Provider/Extender: [name] in Treatment: 3 Debridement Performed for Assessment: Wound #5 Right T Great oe Performed By: Physician Geralyn Corwin, DO Debridement Type: Debridement Level of Consciousness (Pre-procedure): Awake and Alert Pre-procedure Verification/Time Out Yes - 15:10 Taken: Start Time: 15:11 Pain Control: Lidocaine 5% topical ointment T Area Debrided (L x W): otal 0.4 (cm) x 0.6 (cm) = 0.24 (cm) Tissue and other material debrided: Viable, Non-Viable, Slough, Subcutaneous, Skin: Dermis , Skin: Epidermis, Fibrin/Exudate, Slough Level: Skin/Subcutaneous Tissue Debridement Description: Excisional Instrument: Curette Bleeding: Minimum Hemostasis Achieved: Pressure End Time: 15:15 Procedural Pain: 0 Post Procedural Pain: 0 Response to  Treatment: Procedure was tolerated well Level of Consciousness (Post- Awake and Alert procedure): Post Debridement Measurements of Total Wound Length: (cm) 0.4 Width: (cm) 0.6 Depth: (cm) 0.2 Volume: (cm) 0.038 Character of Wound/Ulcer Post Debridement: Improved Post Procedure Diagnosis Same as Pre-procedure Electronic Signature(s) Signed: 02/18/2022 5:07:46 PM By: Shawn Stall RN, BSN Signed: 02/18/2022 5:27:49 PM By: Geralyn Corwin DO Entered By: Shawn Stall on 02/18/2022 15:15:27 -------------------------------------------------------------------------------- HPI Details Patient Name: Date of Service: [name]. 02/18/2022 2:15 PM Medical Record Number: [id number] Patient Account Number: [id number] Date of Birth/Sex: Treating RN: Oct 08, 1927 (88 y.o. [name] Primary Care Provider: [name] Other Clinician: Referring Provider: Treating Provider/Extender: [name] in Treatment: 3 History of Present Illness HPI Description: Admission 01/28/2022 [name] is a 88 year old female with a past medical history of idiopathic peripheral neuropathy status post amputation to the second right toe secondary to osteomyelitis, COPD and A-fib on Coumadin the presents to the clinic for a 52-month history of nonhealing ulcer to a previous amputation site on the second right toe. She states she has tried Medihoney and silver alginate in the past to this area with little benefit. She also has 2 small areas limited to skin breakdown to her lower extremities bilaterally. She has chronic venous insufficiency but not has not been wearing her compression stockings. She states she bumped her legs against an object and not so the wound started. She has been using Medihoney to the sites. She denies signs of infection. 6/2; patient presents for follow-up. She had an x-ray of her right foot done at last clinic visit and this was negative for evidence of  osteomyelitis. She also had a wound culture done that showed extra high levels of Staph aureus. I recommended Keystone antibiotics for this and this was ordered. She had ABIs with TBI's done as well that showed monophasic waveforms to the right foot with TBI of 0 and an ABI of 0.52. Urgent referral was made to vein and vascular and she saw Dr. Durwin Nora on 6/1,  yesterday and he recommended an arteriogram. This is scheduled for 6/16. Patient also reports a new wound to the right great toe. This is a blister that has ruptured. She also reports increased erythema to the toe. 6/6; the patient was worked in urgently today at the insistence of her daughter out of concern for a new wound on the lateral part of the plantar right great toe. She has her original postsurgical wound after the amputation of the right second toe, she has a wound on the medial part of the right great toe. The patient is apparently going for an angiogram by Dr. Durwin Nora in 2 weeks time. 6/13; patient presents for follow-up. She has been using bacitracin to the abrasion on the right great toe. She has been using collagen and Keystone antibiotic to the amputation site. She has no issues or complaints today. She denies signs of infection. Electronic Signature(s) Signed: 02/18/2022 5:27:49 PM By: Geralyn Corwin DO Entered By: Geralyn Corwin on 02/18/2022 16:51:13 -------------------------------------------------------------------------------- Physical Exam Details Patient Name: Date of Service: [name], Megan Catholic. 02/18/2022 2:15 PM Medical Record Number: [id number] Patient Account Number: [id number] Date of Birth/Sex: Treating RN: 1927-12-04 (89 y.o. [name] Primary Care Provider: [name] Other Clinician: Referring Provider: Treating Provider/Extender: [name] Weeks in Treatment: 3 Constitutional respirations regular, non-labored and within target range for patient.. Cardiovascular 2+ dorsalis  pedis/posterior tibialis pulses. Psychiatric pleasant and cooperative. Notes Right foot: T the second digit there is an amputation site that is open with nonviable tissue and granulation tissue. Does not probe to bone. No surrounding o signs of infection. T the lateral aspect of the great toe there is an open wound with nonviable surface. o Electronic Signature(s) Signed: 02/18/2022 5:27:49 PM By: Geralyn Corwin DO Entered By: Geralyn Corwin on 02/18/2022 16:53:19 -------------------------------------------------------------------------------- Physician Orders Details Patient Name: Date of Service: [name]. 02/18/2022 2:15 PM Medical Record Number: [id number] Patient Account Number: [id number] Date of Birth/Sex: Treating RN: 03/25/1928 (88 y.o. [name], [name] Primary Care Provider: [name] Other Clinician: Referring Provider: Treating Provider/Extender: [name] in Treatment: 3 Verbal / Phone Orders: No Diagnosis Coding ICD-10 Coding Code Description [phone] Non-pressure chronic ulcer of other part of right foot with fat layer exposed L97.822 Non-pressure chronic ulcer of other part of left lower leg with fat layer exposed G90.09 Other idiopathic peripheral autonomic neuropathy M86.9 Osteomyelitis, unspecified I48.91 Unspecified atrial fibrillation Z79.01 Long term (current) use of anticoagulants I89.0 Lymphedema, not elsewhere classified I87.2 Venous insufficiency (chronic) (peripheral) J44.9 Chronic obstructive pulmonary disease, unspecified Follow-up Appointments ppointment in 1 week. - Dr. Mikey Bussing and [place], Room 8 Thursday 215pm 02/27/2022 Return A Dr. Mikey Bussing and [place], Room 8 Thursday 215pm 03/06/2022 Other: - [facility] continue to use when it arrives under the Prisma to right amputation toe site.*** medihoney to right great toe and right 5th toe. Angiogram with [facility] 02/21/2022 Edema Control - Lymphedema / SCD /  Other Elevate legs to the level of the heart or above for 30 minutes daily and/or when sitting, a frequency of: - throughout the day. Avoid standing for long periods of time. Home Health New wound care orders this week; continue Home Health for wound care. May utilize formulary equivalent dressing for wound treatment orders unless otherwise specified. - Home health weekly dressing changes. All other days daughter to change. medihoney to right great toe and right 5th toe. Other Home Health Orders/Instructions: - [facility]. Wound Treatment Wound #3 - Amputation Site -  Toe Wound Laterality: Right Cleanser: Soap and Water (Home Health) 1 x Per Day/30 Days Discharge Instructions: May shower and wash wound with dial antibacterial soap and water prior to dressing change. Cleanser: Wound Cleanser (Home Health) 1 x Per Day/30 Days Discharge Instructions: Cleanse the wound with wound cleanser prior to applying a clean dressing using gauze sponges, not tissue or cotton balls. Topical: Compounding T opical Antibiotics (Home Health) 1 x Per Day/30 Days Discharge Instructions: [facility], applies over the [place]. Prim Dressing: Promogran Prisma Matrix, 4.34 (sq in) (silver collagen) (Home Health) 1 x Per Day/30 Days ary Discharge Instructions: Moisten collagen with saline or hydrogel directly to wound bed. apply over the [place]. Secondary Dressing: Woven Gauze Sponges 2x2 in (Home Health) 1 x Per Day/30 Days Discharge Instructions: Apply over primary dressing. ***Apply a rolled gauze under the right great toe. Secured With: Insurance underwriterConforming Stretch Gauze Bandage, Sterile 2x75 (in/in) (Home Health) 1 x Per Day/30 Days Discharge Instructions: Secure with stretch gauze as directed. Secured With: Paper Tape, 2x10 (in/yd) (Home Health) 1 x Per Day/30 Days Discharge Instructions: Secure dressing with tape as directed. Wound #5 - T  Great oe Wound Laterality: Right Cleanser: Soap and Water (Home Health) 1 x Per Day/30 Days Discharge Instructions: May shower and wash wound with dial antibacterial soap and water prior to dressing change. Prim Dressing: MediHoney Gel, tube 1.5 (oz) (Home Health) 1 x Per Day/30 Days ary Discharge Instructions: Apply to wound bed as instructed Secondary Dressing: Woven Gauze Sponges 2x2 in (Home Health) 1 x Per Day/30 Days Discharge Instructions: Apply over primary dressing ensure rolled 2x2 gauze to keep toe straight. Secured With: Insurance underwriterConforming Stretch Gauze Bandage, Sterile 2x75 (in/in) (Home Health) 1 x Per Day/30 Days Discharge Instructions: Secure with stretch gauze as directed. Electronic Signature(s) Signed: 02/18/2022 5:27:49 PM By: Geralyn CorwinHoffman, Arias Weinert DO Entered By: Geralyn CorwinHoffman, Demetrice Amstutz on 02/18/2022 16:53:27 -------------------------------------------------------------------------------- Problem List Details Patient Name: Date of Service: Megan ReapHILL, [name] 02/18/2022 2:15 PM Medical Record Number: [id number] Patient Account Number: [id number] Date of Birth/Sex: Treating RN: 03/20/1928 (88 y.o. Debara PickettF) Deaton, Yvonne KendallBobbi Primary Care Provider: Mila PalmerWolters, Sharon Other Clinician: Referring Provider: Treating Provider/Extender: Burman RiisHoffman, Srihari Shellhammer Wolters, Sharon Weeks in Treatment: 3 Active Problems ICD-10 Encounter Code Description Active Date MDM Diagnosis [phone] Non-pressure chronic ulcer of other part of right foot with fat layer exposed 01/28/2022 No Yes L97.822 Non-pressure chronic ulcer of other part of left lower leg with fat layer exposed5/23/2023 No Yes G90.09 Other idiopathic peripheral autonomic neuropathy 01/28/2022 No Yes M86.9 Osteomyelitis, unspecified 01/28/2022 No Yes I48.91 Unspecified atrial fibrillation 01/28/2022 No Yes Z79.01 Long term (current) use of anticoagulants 01/28/2022 No Yes I89.0 Lymphedema, not elsewhere classified 01/28/2022 No Yes I87.2 Venous insufficiency (chronic)  (peripheral) 01/28/2022 No Yes J44.9 Chronic obstructive pulmonary disease, unspecified 01/28/2022 No Yes Inactive Problems ICD-10 Code Description Active Date Inactive Date L97.812 Non-pressure chronic ulcer of other part of right lower leg with fat layer exposed 01/28/2022 01/28/2022 Resolved Problems Electronic Signature(s) Signed: 02/18/2022 5:27:49 PM By: Geralyn CorwinHoffman, Rheannon Cerney DO Entered By: Geralyn CorwinHoffman, Hope Holst on 02/18/2022 16:50:08 -------------------------------------------------------------------------------- Progress Note Details Patient Name: Date of Service: Megan ReapHILL, [name] 02/18/2022 2:15 PM Medical Record Number: [id number] Patient Account Number: [id number] Date of Birth/Sex: Treating RN: 07/19/1928 (88 y.o. [name]) Deaton, Bobbi Primary Care Provider: Mila PalmerWolters, Sharon Other Clinician: Referring Provider: Treating Provider/Extender: Burman RiisHoffman, Lashe Oliveira Wolters, Sharon Weeks in Treatment: 3 Subjective Chief Complaint Information obtained from Patient 01/28/2022; right second toe amputation  site dehiscence and bilateral lower extremity wounds. History of Present Illness (HPI) Admission 01/28/2022 Ms. Sarha Bartelt is a 88 year old female with a past medical history of idiopathic peripheral neuropathy status post amputation to the second right toe secondary to osteomyelitis, COPD and A-fib on Coumadin the presents to the clinic for a 68-month history of nonhealing ulcer to a previous amputation site on the second right toe. She states she has tried Medihoney and silver alginate in the past to this area with little benefit. She also has 2 small areas limited to skin breakdown to her lower extremities bilaterally. She has chronic venous insufficiency but not has not been wearing her compression stockings. She states she bumped her legs against an object and not so the wound started. She has been using Medihoney to the sites. She denies signs of infection. 6/2; patient presents for follow-up. She had an  x-ray of her right foot done at last clinic visit and this was negative for evidence of osteomyelitis. She also had a wound culture done that showed extra high levels of Staph aureus. I recommended Keystone antibiotics for this and this was ordered. She had ABIs with TBI's done as well that showed monophasic waveforms to the right foot with TBI of 0 and an ABI of 0.52. Urgent referral was made to vein and vascular and she saw Dr. Durwin Nora on 6/1, yesterday and he recommended an arteriogram. This is scheduled for 6/16. Patient also reports a new wound to the right great toe. This is a blister that has ruptured. She also reports increased erythema to the toe. 6/6; the patient was worked in urgently today at the insistence of her daughter out of concern for a new wound on the lateral part of the plantar right great toe. She has her original postsurgical wound after the amputation of the right second toe, she has a wound on the medial part of the right great toe. The patient is apparently going for an angiogram by Dr. Durwin Nora in 2 weeks time. 6/13; patient presents for follow-up. She has been using bacitracin to the abrasion on the right great toe. She has been using collagen and Keystone antibiotic to the amputation site. She has no issues or complaints today. She denies signs of infection. Patient History Information obtained from Patient, Caregiver. Family History Cancer - Siblings, Heart Disease, Stroke - Mother, No family history of Diabetes. Social History Former smoker - quit 2003, Marital Status - Widowed, Alcohol Use - Never, Drug Use - No History, Caffeine Use - Never. Medical History Eyes Patient has history of Cataracts Hematologic/Lymphatic Patient has history of Anemia Respiratory Denies history of Chronic Obstructive Pulmonary Disease (COPD) Cardiovascular Patient has history of Congestive Heart Failure, Hypotension, Peripheral Venous Disease Musculoskeletal Patient has history of  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 Neurologic Patient has history of Neuropathy Medical A Surgical History Notes nd Hematologic/Lymphatic Thrombocytopenia Endocrine Hyperthyroidism, Hypothyroidism Objective Constitutional respirations regular, non-labored and within target range for patient.. Vitals Time Taken: 2:45 PM, Height: 62 in, Weight: 181 lbs, BMI: 33.1, Temperature: 98.1 F, Pulse: 61 bpm, Respiratory Rate: 18 breaths/min, Blood Pressure: 104/64 mmHg. Cardiovascular 2+ dorsalis pedis/posterior tibialis pulses. Psychiatric pleasant and cooperative. General Notes: Right foot: T the second digit there is an amputation site that is open with nonviable tissue and granulation tissue. Does not probe to bone. No o surrounding signs of infection. T the lateral aspect of the great toe there is an open wound with nonviable surface. o Integumentary (Hair, Skin) Wound #  3 status is Open. Original cause of wound was Surgical Injury. The date acquired was: 11/17/2021. The wound has been in treatment 3 weeks. The wound is located on the Right Amputation Site - T The wound measures 0.9cm length x 0.3cm width x 0.3cm depth; 0.212cm^2 area and 0.064cm^3 volume. oe. There is Fat Layer (Subcutaneous Tissue) exposed. There is no tunneling or undermining noted. There is a medium amount of serosanguineous drainage noted. The wound margin is epibole. There is no granulation within the wound bed. There is a large (67-100%) amount of necrotic tissue within the wound bed including Adherent Slough. Wound #5 status is Open. Original cause of wound was Shear/Friction. The date acquired was: 02/06/2022. The wound has been in treatment 1 weeks. The wound is located on the Right T Great. The wound measures 0.4cm length x 0.6cm width x 0.2cm depth; 0.188cm^2 area and 0.038cm^3 volume. There is Fat Layer oe (Subcutaneous Tissue) exposed. There is no tunneling or undermining noted. There is a medium  amount of serosanguineous drainage noted. The wound margin is distinct with the outline attached to the wound base. There is medium (34-66%) pink, pale granulation within the wound bed. There is no necrotic tissue within the wound bed. Assessment Active Problems ICD-10 Non-pressure chronic ulcer of other part of right foot with fat layer exposed Non-pressure chronic ulcer of other part of left lower leg with fat layer exposed Other idiopathic peripheral autonomic neuropathy Osteomyelitis, unspecified Unspecified atrial fibrillation Long term (current) use of anticoagulants Lymphedema, not elsewhere classified Venous insufficiency (chronic) (peripheral) Chronic obstructive pulmonary disease, unspecified Patient's wounds are stable. I debrided nonviable tissue to the right great toe. I recommended switching to Medihoney to this wound As bacitracin is not making much of a difference. She can continue Winifred and collagen to the second toe amputation site. No signs of surrounding infection. Follow-up in 1 week. Procedures Wound #5 Pre-procedure diagnosis of Wound #5 is an Abrasion located on the Right T Great . There was a Excisional Skin/Subcutaneous Tissue Debridement with a oe total area of 0.24 sq cm performed by Geralyn Corwin, DO. With the following instrument(s): Curette to remove Viable and Non-Viable tissue/material. Material removed includes Subcutaneous Tissue, Slough, Skin: Dermis, Skin: Epidermis, and Fibrin/Exudate after achieving pain control using Lidocaine 5% topical ointment. A time out was conducted at 15:10, prior to the start of the procedure. A Minimum amount of bleeding was controlled with Pressure. The procedure was tolerated well with a pain level of 0 throughout and a pain level of 0 following the procedure. Post Debridement Measurements: 0.4cm length x 0.6cm width x 0.2cm depth; 0.038cm^3 volume. Character of Wound/Ulcer Post Debridement is improved. Post  procedure Diagnosis Wound #5: Same as Pre-Procedure Plan Follow-up Appointments: Return Appointment in 1 week. - Dr. Mikey Bussing and [place], Room 8 Thursday 215pm 02/27/2022 Dr. Mikey Bussing and [place], Room 8 Thursday 215pm 03/06/2022 Other: - [facility] continue to use when it arrives under the Prisma to right amputation toe site.*** medihoney to right great toe and right 5th toe. Angiogram with [facility] 02/21/2022 Edema Control - Lymphedema / SCD / Other: Elevate legs to the level of the heart or above for 30 minutes daily and/or when sitting, a frequency of: - throughout the day. Avoid standing for long periods of time. Home Health: New wound care orders this week; continue Home Health for wound care. May utilize formulary equivalent dressing for wound treatment orders unless otherwise specified. - Home health weekly dressing changes. All other days daughter to  change. medihoney to right great toe and right 5th toe. Other Home Health Orders/Instructions: - [facility]. WOUND #3: - Amputation Site - T oe Wound Laterality: Right Cleanser: Soap and Water (Home Health) 1 x Per Day/30 Days Discharge Instructions: May shower and wash wound with dial antibacterial soap and water prior to dressing change. Cleanser: Wound Cleanser (Home Health) 1 x Per Day/30 Days Discharge Instructions: Cleanse the wound with wound cleanser prior to applying a clean dressing using gauze sponges, not tissue or cotton balls. Topical: Compounding T opical Antibiotics (Home Health) 1 x Per Day/30 Days Discharge Instructions: Apply T opical Antibiotics from [facility] daily once it arrives, applies over the [place]. Prim Dressing: Promogran Prisma Matrix, 4.34 (sq in) (silver collagen) (Home Health) 1 x Per Day/30 Days ary Discharge Instructions: Moisten collagen with saline or hydrogel directly to wound bed. apply over the [place] when arrives. Secondary Dressing: Woven Gauze Sponges 2x2 in (Home Health) 1 x  Per Day/30 Days Discharge Instructions: Apply over primary dressing. ***Apply a rolled gauze under the right great toe. Secured With: Insurance underwriter, Sterile 2x75 (in/in) (Home Health) 1 x Per Day/30 Days Discharge Instructions: Secure with stretch gauze as directed. Secured With: Paper T ape, 2x10 (in/yd) (Home Health) 1 x Per Day/30 Days Discharge Instructions: Secure dressing with tape as directed. WOUND #5: - T Great Wound Laterality: Right oe Cleanser: Soap and Water (Home Health) 1 x Per Day/30 Days Discharge Instructions: May shower and wash wound with dial antibacterial soap and water prior to dressing change. Prim Dressing: MediHoney Gel, tube 1.5 (oz) (Home Health) 1 x Per Day/30 Days ary Discharge Instructions: Apply to wound bed as instructed Secondary Dressing: Woven Gauze Sponges 2x2 in (Home Health) 1 x Per Day/30 Days Discharge Instructions: Apply over primary dressing ensure rolled 2x2 gauze to keep toe straight. Secured With: Insurance underwriter, Sterile 2x75 (in/in) (Home Health) 1 x Per Day/30 Days Discharge Instructions: Secure with stretch gauze as directed. 1. Medihoney 2. Keystone antibiotic with collagen 3. Follow-up in 1 week Electronic Signature(s) Signed: 02/18/2022 5:27:49 PM By: Geralyn Corwin DO Entered By: Geralyn Corwin on 02/18/2022 17:03:07 -------------------------------------------------------------------------------- HxROS Details Patient Name: Date of Service: [name]. 02/18/2022 2:15 PM Medical Record Number: [id number] Patient Account Number: [id number] Date of Birth/Sex: Treating RN: 08-02-1928 (88 y.o. [name] Primary Care Provider: [name] Other Clinician: Referring Provider: Treating Provider/Extender: [name] in Treatment: 3 Information Obtained From Patient Caregiver Eyes Medical History: Positive for: Cataracts Hematologic/Lymphatic Medical  History: Positive for: Anemia Past Medical History Notes: Thrombocytopenia Respiratory Medical History: Negative for: Chronic Obstructive Pulmonary Disease (COPD) Cardiovascular Medical History: Positive for: Congestive Heart Failure; Hypotension; Peripheral Venous Disease Endocrine Medical History: Past Medical History Notes: Hyperthyroidism, Hypothyroidism Musculoskeletal Medical History: Positive for: 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 Neurologic Medical History: Positive for: Neuropathy HBO Extended History Items Eyes: Cataracts Immunizations Pneumococcal Vaccine: Received Pneumococcal Vaccination: No Implantable Devices None Family and Social History Cancer: Yes - Siblings; Diabetes: No; Heart Disease: Yes; Stroke: Yes - Mother; Former smoker - quit 2003; Marital Status - Widowed; Alcohol Use: Never; Drug Use: No History; Caffeine Use: Never; Financial Concerns: No; Food, Clothing or Shelter Needs: No; Support System Lacking: No; Transportation Concerns: No Electronic Signature(s) Signed: 02/18/2022 5:07:46 PM By: Shawn Stall RN, BSN Signed: 02/18/2022 5:27:49 PM By: Geralyn Corwin DO Entered By: Geralyn Corwin on 02/18/2022 16:51:20 -------------------------------------------------------------------------------- SuperBill Details Patient Name: Date of Service: Lalanne, HA  TTIE M. 02/18/2022 Medical Record Number: [id number] Patient Account Number: [id number] Date of Birth/Sex: Treating RN: 1928-06-21 (88 y.o. 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 M86.9 Osteomyelitis, unspecified I48.91 Unspecified atrial fibrillation Z79.01 Long term (current) use of anticoagulants I89.0 Lymphedema, not elsewhere classified I87.2 Venous insufficiency (chronic) (peripheral) J44.9 Chronic obstructive pulmonary disease, unspecified Facility Procedures CPT4 Code: [id number] Description: 11042 - DEB SUBQ TISSUE 20 SQ CM/< ICD-10 Diagnosis Description L97.512 Non-pressure chronic ulcer of other part of right foot with fat layer exposed Modifier: Quantity: 1 Physician Procedures : CPT4 Code Description Modifier [id number] - WC PHYS LEVEL 2 - EST PT ICD-10 Diagnosis Description L97.512 Non-pressure chronic ulcer of other part of right foot with fat layer exposed M86.9 Osteomyelitis, unspecified I87.2 Venous insufficiency  (chronic) (peripheral) Quantity: 1 : [id number] 11042 - WC PHYS SUBQ TISS 20 SQ CM ICD-10 Diagnosis Description L97.512 Non-pressure chronic ulcer of other part of right foot with fat layer exposed Quantity: 1 Electronic Signature(s) Signed: 02/18/2022 5:27:49 PM By: Geralyn Corwin DO Entered By: Geralyn Corwin on 02/18/2022 17:03:48

## 2022-02-19 NOTE — Progress Notes (Signed)
AUDRA, HERT ([id number]) Visit Report for 02/18/2022 Arrival Information Details Patient Name: Date of Service: [name], POLLINS. 02/18/2022 2:15 PM Medical Record Number: [id number] Patient Account Number: [id number] Date of Birth/Sex: Treating RN: Mar 30, 1928 (88 y.o. [name], Tammi Klippel Primary Care Jullia Mulligan: [name] Other Clinician: Referring Dion Sibal: Treating Tian Mcmurtrey/Extender: Theodosia Quay in Treatment: 3 Visit Information History Since Last Visit Added or deleted any medications: No Patient Arrived: Wheel Chair Any new allergies or adverse reactions: No Arrival Time: 14:38 Had a fall or experienced change in No Accompanied By: Daughter activities of daily living that may affect Transfer Assistance: Manual risk of falls: Patient Requires Transmission-Based Precautions: No Signs or symptoms of abuse/neglect since last visito No Patient Has Alerts: Yes Hospitalized since last visit: No Patient Alerts: Patient on Blood Thinner Implantable device outside of the clinic excluding No cellular tissue based products placed in the center since last visit: Has Dressing in Place as Prescribed: Yes Pain Present Now: No Electronic Signature(s) Signed: 02/19/2022 8:50:49 AM By: Erenest Blank Entered By: Erenest Blank on 02/18/2022 14:45:09 -------------------------------------------------------------------------------- Encounter Discharge Information Details Patient Name: Date of Service: [name]. 02/18/2022 2:15 PM Medical Record Number: [id number] Patient Account Number: [id number] Date of Birth/Sex: Treating RN: 1928-05-01 (88 y.o. [name], Tammi Klippel Primary Care Cruise Baumgardner: [name] Other Clinician: Referring Roxana Lai: Treating Tricha Ruggirello/Extender: Theodosia Quay in Treatment: 3 Encounter Discharge Information Items Post Procedure Vitals Discharge Condition: Stable Temperature (F): 98.1 Ambulatory Status:  Wheelchair Pulse (bpm): 61 Discharge Destination: Home Respiratory Rate (breaths/min): 20 Transportation: Private Auto Blood Pressure (mmHg): 104/64 Accompanied By: daughter Schedule Follow-up Appointment: Yes Clinical Summary of Care: Electronic Signature(s) Signed: 02/18/2022 5:07:46 PM By: Deon Pilling RN, BSN Entered By: Deon Pilling on 02/18/2022 15:20:55 -------------------------------------------------------------------------------- Lower Extremity Assessment Details Patient Name: Date of Service: [name]. 02/18/2022 2:15 PM Medical Record Number: [id number] Patient Account Number: [id number] Date of Birth/Sex: Treating RN: 18-Jul-1928 (88 y.o. [name], Tammi Klippel Primary Care Tyon Cerasoli: [name] Other Clinician: Referring Camille Thau: Treating Jenna Routzahn/Extender: Edgardo Roys Weeks in Treatment: 3 Edema Assessment Assessed: [Left: No] [Right: No] Edema: [Left: Yes] [Right: Yes] Calf Left: Right: Point of Measurement: 30 cm From Medial Instep 44.4 cm Ankle Left: Right: Point of Measurement: 9 cm From Medial Instep 24.2 cm Vascular Assessment Pulses: Dorsalis Pedis Palpable: [Right:Yes] Electronic Signature(s) Signed: 02/18/2022 5:07:46 PM By: Deon Pilling RN, BSN Signed: 02/19/2022 8:50:49 AM By: Erenest Blank Entered By: Erenest Blank on 02/18/2022 14:57:24 -------------------------------------------------------------------------------- Multi Wound Chart Details Patient Name: Date of Service: [name]. 02/18/2022 2:15 PM Medical Record Number: [id number] Patient Account Number: [id number] Date of Birth/Sex: Treating RN: 1927/09/27 (88 y.o. [name], [name] Primary Care Azha Constantin: [name] Other Clinician: Referring Ferguson Gertner: Treating Shaida Route/Extender: Theodosia Quay in Treatment: 3 Vital Signs Height(in): 62 Pulse(bpm): 61 Weight(lbs): 181 Blood Pressure(mmHg): 104/64 Body Mass  Index(BMI): 33.1 Temperature(F): 98.1 Respiratory Rate(breaths/min): 18 Photos: [3:Right Amputation Site - Toe] [5:Right T Great oe] [N/A:N/A N/A] Wound Location: [3:Surgical Injury] [5:Shear/Friction] [N/A:N/A] Wounding Event: [3:Open Surgical Wound] [5:Abrasion] [N/A:N/A] Primary Etiology: [3:Cataracts, Anemia, Congestive Heart Cataracts, Anemia, Congestive Heart N/A] Comorbid History: [3:Failure, Hypotension, Peripheral Venous Disease, Osteomyelitis, Neuropathy 11/17/2021] [5:Failure, Hypotension, Peripheral Venous Disease, Osteomyelitis, Neuropathy 02/06/2022] [N/A:N/A] Date Acquired: [3:3] [5:1] [N/A:N/A] Weeks of Treatment: [3:Open] [5:Open] [N/A:N/A] Wound Status: [3:No] [5:No] [N/A:N/A] Wound Recurrence: [3:0.9x0.3x0.3] [5:0.4x0.6x0.2] [N/A:N/A] Measurements L x W x D (cm) [3:0.212] [5:0.188] [N/A:N/A] A (cm) : rea [3:0.064] [5:0.038] [N/A:N/A] Volume (cm) : [  3:50.90%] [5:51.20%] [N/A:N/A] % Reduction in A [3:rea: 70.40%] [5:0.00%] [N/A:N/A] % Reduction in Volume: [3:Full Thickness Without Exposed] [5:Full Thickness Without Exposed] [N/A:N/A] Classification: [3:Support Structures Medium] [5:Support Structures Medium] [N/A:N/A] Exudate A mount: [3:Serosanguineous] [5:Serosanguineous] [N/A:N/A] Exudate Type: [3:red, brown] [5:red, brown] [N/A:N/A] Exudate Color: [3:Epibole] [5:Distinct, outline attached] [N/A:N/A] Wound Margin: [3:None Present (0%)] [5:Medium (34-66%)] [N/A:N/A] Granulation A mount: [3:N/A] [5:Pink, Pale] [N/A:N/A] Granulation Quality: [3:Large (67-100%)] [5:None Present (0%)] [N/A:N/A] Necrotic A mount: [3:Fat Layer (Subcutaneous Tissue): Yes Fat Layer (Subcutaneous Tissue): Yes N/A] Exposed Structures: [3:Fascia: No Tendon: No Muscle: No Joint: No Bone: No None] [5:Fascia: No Tendon: No Muscle: No Joint: No Bone: No None] [N/A:N/A] Epithelialization: [3:N/A] [5:Debridement - Excisional] [N/A:N/A] Debridement: Pre-procedure Verification/Time Out N/A [5:15:10]  [N/A:N/A] Taken: [3:N/A] [5:Lidocaine 5% topical ointment] [N/A:N/A] Pain Control: [3:N/A] [5:Subcutaneous, Slough] [N/A:N/A] Tissue Debrided: [3:N/A] [5:Skin/Subcutaneous Tissue] [N/A:N/A] Level: [3:N/A] [5:0.24] [N/A:N/A] Debridement A (sq cm): [3:rea N/A] [5:Curette] [N/A:N/A] Instrument: [3:N/A] [5:Minimum] [N/A:N/A] Bleeding: [3:N/A] [5:Pressure] [N/A:N/A] Hemostasis A chieved: [3:N/A] [5:0] [N/A:N/A] Procedural Pain: [3:N/A] [5:0] [N/A:N/A] Post Procedural Pain: [3:N/A] [5:Procedure was tolerated well] [N/A:N/A] Debridement Treatment Response: [3:N/A] [5:0.4x0.6x0.2] [N/A:N/A] Post Debridement Measurements L x W x D (cm) [3:N/A] [5:0.038] [N/A:N/A] Post Debridement Volume: (cm) [3:N/A] [5:Debridement] [N/A:N/A] Treatment Notes Wound #3 (Amputation Site - Toe) Wound Laterality: Right Cleanser Soap and Water Discharge Instruction: May shower and wash wound with dial antibacterial soap and water prior to dressing change. Wound Cleanser Discharge Instruction: Cleanse the wound with wound cleanser prior to applying a clean dressing using gauze sponges, not tissue or cotton balls. Peri-Wound Care Topical Compounding T opical Antibiotics Discharge Instruction: Apply T opical Antibiotics from [facility] daily once it arrives, applies over the [place]. Primary Dressing Promogran Prisma Matrix, 4.34 (sq in) (silver collagen) Discharge Instruction: Moisten collagen with saline or hydrogel directly to wound bed. apply over the [place] when arrives. Secondary Dressing Woven Gauze Sponges 2x2 in Discharge Instruction: Apply over primary dressing. ***Apply a rolled gauze under the right great toe. Secured With Conforming Stretch Gauze Bandage, Sterile 2x75 (in/in) Discharge Instruction: Secure with stretch gauze as directed. Paper Tape, 2x10 (in/yd) Discharge Instruction: Secure dressing with tape as directed. Compression Wrap Compression Stockings Add-Ons Wound #5 (Toe Great)  Wound Laterality: Right Cleanser Soap and Water Discharge Instruction: May shower and wash wound with dial antibacterial soap and water prior to dressing change. Peri-Wound Care Topical Primary Dressing MediHoney Gel, tube 1.5 (oz) Discharge Instruction: Apply to wound bed as instructed Secondary Dressing Woven Gauze Sponges 2x2 in Discharge Instruction: Apply over primary dressing ensure rolled 2x2 gauze to keep toe straight. Secured With Conforming Stretch Gauze Bandage, Sterile 2x75 (in/in) Discharge Instruction: Secure with stretch gauze as directed. Compression Wrap Compression Stockings Add-Ons Electronic Signature(s) Signed: 02/18/2022 5:07:46 PM By: Deon Pilling RN, BSN Signed: 02/18/2022 5:27:49 PM By: Kalman Shan DO Entered By: Kalman Shan on 02/18/2022 16:50:17 -------------------------------------------------------------------------------- Multi-Disciplinary Care Plan Details Patient Name: Date of Service: [name]. 02/18/2022 2:15 PM Medical Record Number: [id number] Patient Account Number: [id number] Date of Birth/Sex: Treating RN: 30-Jul-1928 (88 y.o. [name], Tammi Klippel Primary Care Roselind Klus: [name] Other Clinician: Referring Nene Aranas: Treating Ezzie Senat/Extender: Theodosia Quay in Treatment: 3 Active Inactive Nutrition Nursing Diagnoses: Potential for alteratiion in Nutrition/Potential for imbalanced nutrition Goals: Patient/caregiver agrees to and verbalizes understanding of need to use nutritional supplements and/or vitamins as prescribed Date Initiated: 01/28/2022 Target Resolution Date: 03/07/2022 Goal Status: Active Interventions: Assess patient nutrition upon admission and as needed per policy Provide education  on nutrition Treatment Activities: Patient referred to Primary Care Physician for further nutritional evaluation : 01/28/2022 Notes: Pain, Acute or Chronic Nursing Diagnoses: Pain, acute or  chronic: actual or potential Potential alteration in comfort, pain Goals: Patient will verbalize adequate pain control and receive pain control interventions during procedures as needed Date Initiated: 01/28/2022 Target Resolution Date: 03/07/2022 Goal Status: Active Interventions: Encourage patient to take pain medications as prescribed Provide education on pain management Treatment Activities: Administer pain control measures as ordered : 01/28/2022 Notes: Wound/Skin Impairment Nursing Diagnoses: Knowledge deficit related to ulceration/compromised skin integrity Goals: Patient/caregiver will verbalize understanding of skin care regimen Date Initiated: 01/28/2022 Target Resolution Date: 03/07/2022 Goal Status: Active Interventions: Assess patient/caregiver ability to perform ulcer/skin care regimen upon admission and as needed Assess ulceration(s) every visit Provide education on ulcer and skin care Treatment Activities: Skin care regimen initiated : 01/28/2022 Topical wound management initiated : 01/28/2022 Notes: Electronic Signature(s) Signed: 02/18/2022 5:07:46 PM By: Deon Pilling RN, BSN Entered By: Deon Pilling on 02/18/2022 15:14:05 -------------------------------------------------------------------------------- Pain Assessment Details Patient Name: Date of Service: [name]. 02/18/2022 2:15 PM Medical Record Number: [id number] Patient Account Number: [id number] Date of Birth/Sex: Treating RN: 03-22-1928 (88 y.o. [name], Tammi Klippel Primary Care Marki Frede: [name] Other Clinician: Referring Sharna Gabrys: Treating Shail Urbas/Extender: Theodosia Quay in Treatment: 3 Active Problems Location of Pain Severity and Description of Pain Patient Has Paino No Site Locations Pain Management and Medication Current Pain Management: Electronic Signature(s) Signed: 02/18/2022 5:07:46 PM By: Deon Pilling RN, BSN Signed: 02/19/2022 8:50:49 AM By: Erenest Blank Entered By: Erenest Blank on 02/18/2022 14:46:53 -------------------------------------------------------------------------------- Patient/Caregiver Education Details Patient Name: Date of Service: [name] 6/13/2023andnbsp2:15 PM Medical Record Number: [id number] Patient Account Number: [id number] Date of Birth/Gender: Treating RN: 11-11-1927 (88 y.o. [name] Primary Care Physician: [name] Other Clinician: Referring Physician: Treating Physician/Extender: Theodosia Quay in Treatment: 3 Education Assessment Education Provided To: Patient and Caregiver Education Topics Provided Wound/Skin Impairment: Methods: Explain/Verbal Responses: State content correctly Electronic Signature(s) Signed: 02/18/2022 5:07:46 PM By: Deon Pilling RN, BSN Entered By: Deon Pilling on 02/18/2022 15:14:17 -------------------------------------------------------------------------------- Wound Assessment Details Patient Name: Date of Service: [name]. 02/18/2022 2:15 PM Medical Record Number: [id number] Patient Account Number: [id number] Date of Birth/Sex: Treating RN: [birth year] (88 y.o. [name], [name] Primary Care Isatou Agredano: [name] Other Clinician: Referring Lemoine Goyne: Treating Zephan Beauchaine/Extender: Edgardo Roys Weeks in Treatment: 3 Wound Status Wound Number: 3 Primary Open Surgical Wound Etiology: Wound Location: Right Amputation Site - Toe Wound Open Wounding Event: Surgical Injury Status: Date Acquired: 11/17/2021 Comorbid Cataracts, Anemia, Congestive Heart Failure, Hypotension, Weeks Of Treatment: 3 History: Peripheral Venous Disease, Osteomyelitis, Neuropathy Clustered Wound: No Photos Wound Measurements Length: (cm) 0.9 Width: (cm) 0.3 Depth: (cm) 0.3 Area: (cm) 0.212 Volume: (cm) 0.064 % Reduction in Area: 50.9% % Reduction in Volume: 70.4% Epithelialization: None Tunneling:  No Undermining: No Wound Description Classification: Full Thickness Without Exposed Support Structures Wound Margin: Epibole Exudate Amount: Medium Exudate Type: Serosanguineous Exudate Color: red, brown Foul Odor After Cleansing: No Slough/Fibrino Yes Wound Bed Granulation Amount: None Present (0%) Exposed Structure Necrotic Amount: Large (67-100%) Fascia Exposed: No Necrotic Quality: Adherent Slough Fat Layer (Subcutaneous Tissue) Exposed: Yes Tendon Exposed: No Muscle Exposed: No Joint Exposed: No Bone Exposed: No Electronic Signature(s) Signed: 02/18/2022 5:07:46 PM By: Deon Pilling RN, BSN Signed: 02/19/2022 8:50:49 AM By: Erenest Blank Entered By: Erenest Blank on 02/18/2022 15:02:30 -------------------------------------------------------------------------------- Wound Assessment Details  Patient Name: Date of Service: BAILLIE, NASON 02/18/2022 2:15 PM Medical Record Number: [id number] Patient Account Number: [id number] Date of Birth/Sex: Treating RN: Jan 13, 1928 (88 y.o. [name], [name] Primary Care Sherman Donaldson: [name] Other Clinician: Referring Ladavia Lindenbaum: Treating Carle Dargan/Extender: Edgardo Roys Weeks in Treatment: 3 Wound Status Wound Number: 5 Primary Abrasion Etiology: Wound Location: Right T Great oe Wound Open Wounding Event: Shear/Friction Status: Date Acquired: 02/06/2022 Comorbid Cataracts, Anemia, Congestive Heart Failure, Hypotension, Weeks Of Treatment: 1 History: Peripheral Venous Disease, Osteomyelitis, Neuropathy Clustered Wound: No Photos Wound Measurements Length: (cm) 0.4 Width: (cm) 0.6 Depth: (cm) 0.2 Area: (cm) 0.188 Volume: (cm) 0.038 % Reduction in Area: 51.2% % Reduction in Volume: 0% Epithelialization: None Tunneling: No Undermining: No Wound Description Classification: Full Thickness Without Exposed Support Structures Wound Margin: Distinct, outline attached Exudate Amount: Medium Exudate Type:  Serosanguineous Exudate Color: red, brown Foul Odor After Cleansing: No Slough/Fibrino No Wound Bed Granulation Amount: Medium (34-66%) Exposed Structure Granulation Quality: Pink, Pale Fascia Exposed: No Necrotic Amount: None Present (0%) Fat Layer (Subcutaneous Tissue) Exposed: Yes Tendon Exposed: No Muscle Exposed: No Joint Exposed: No Bone Exposed: No Treatment Notes Wound #5 (Toe Great) Wound Laterality: Right Cleanser Soap and Water Discharge Instruction: May shower and wash wound with dial antibacterial soap and water prior to dressing change. Peri-Wound Care Topical Primary Dressing MediHoney Gel, tube 1.5 (oz) Discharge Instruction: Apply to wound bed as instructed Secondary Dressing Woven Gauze Sponges 2x2 in Discharge Instruction: Apply over primary dressing ensure rolled 2x2 gauze to keep toe straight. Secured With Conforming Stretch Gauze Bandage, Sterile 2x75 (in/in) Discharge Instruction: Secure with stretch gauze as directed. Compression Wrap Compression Stockings Add-Ons Electronic Signature(s) Signed: 02/18/2022 5:07:46 PM By: Deon Pilling RN, BSN Signed: 02/19/2022 8:50:49 AM By: Erenest Blank Signed: 02/19/2022 8:50:49 AM By: Erenest Blank Entered By: Erenest Blank on 02/18/2022 15:03:11 -------------------------------------------------------------------------------- Vitals Details Patient Name: Date of Service: [name]. 02/18/2022 2:15 PM Medical Record Number: [id number] Patient Account Number: [id number] Date of Birth/Sex: Treating RN: 06/16/1928 (88 y.o. [name], Tammi Klippel Primary Care Oletta Buehring: [name] Other Clinician: Referring Ashlin Kreps: Treating Audriella Blakeley/Extender: Theodosia Quay in Treatment: 3 Vital Signs Time Taken: 14:45 Temperature (F): 98.1 Height (in): 62 Pulse (bpm): 61 Weight (lbs): 181 Respiratory Rate (breaths/min): 18 Body Mass Index (BMI): 33.1 Blood Pressure (mmHg):  104/64 Reference Range: 80 - 120 mg / dl Electronic Signature(s) Signed: 02/19/2022 8:50:49 AM By: Erenest Blank Entered By: Erenest Blank on 02/18/2022 14:46:40

## 2022-02-20 ENCOUNTER — Telehealth: Payer: Self-pay

## 2022-02-20 NOTE — Telephone Encounter (Signed)
-----   Message from Chuck Hint, MD sent at 02/20/2022 12:50 PM EDT ----- Regarding: RE: FYI No. That sounds like a good plan. Thayer Ohm  ----- Message ----- From: Primitivo Gauze, RN Sent: 02/20/2022  11:55 AM EDT To: Chuck Hint, MD Subject: FYI                                            Dr. Charlton Haws- Patient is scheduled for aortogram tomorrow for PAD with ulcer right leg. Her daughter called concerned regarding INR 3.7 on June 8 (previously 4.7 week prior). Patient was instructed to hold Coumadin 5 days for procedure, so last dose on June 10. I recommended for patient to have her INR checked today and call back with results. Any additional recs?   Georgianne Fick

## 2022-02-20 NOTE — Telephone Encounter (Signed)
Spoke with patient's daughter, [name]. Reports INR 1.3 today.

## 2022-02-21 ENCOUNTER — Ambulatory Visit (HOSPITAL_COMMUNITY)
Admission: RE | Admit: 2022-02-21 | Discharge: 2022-02-21 | Disposition: A | Discharge status: Home / Self Care | Payer: Medicare HMO | Attending: Vascular Surgery | Admitting: Vascular Surgery

## 2022-02-21 ENCOUNTER — Other Ambulatory Visit: Payer: Self-pay

## 2022-02-21 ENCOUNTER — Encounter (HOSPITAL_COMMUNITY)
Admission: RE | Disposition: A | Discharge status: Home / Self Care | Payer: Self-pay | Source: Home / Self Care | Attending: Vascular Surgery

## 2022-02-21 DIAGNOSIS — L97519 Non-pressure chronic ulcer of other part of right foot with unspecified severity: Secondary | ICD-10-CM | POA: Insufficient documentation

## 2022-02-21 DIAGNOSIS — Z539 Procedure and treatment not carried out, unspecified reason: Secondary | ICD-10-CM | POA: Diagnosis not present

## 2022-02-21 DIAGNOSIS — I70235 Atherosclerosis of native arteries of right leg with ulceration of other part of foot: Secondary | ICD-10-CM | POA: Insufficient documentation

## 2022-02-21 DIAGNOSIS — L97909 Non-pressure chronic ulcer of unspecified part of unspecified lower leg with unspecified severity: Secondary | ICD-10-CM

## 2022-02-21 LAB — BASIC METABOLIC PANEL
Anion gap: 12 (ref 5–15)
BUN: 47 mg/dL — ABNORMAL HIGH (ref 8–23)
CO2: 21 mmol/L — ABNORMAL LOW (ref 22–32)
Calcium: 9.5 mg/dL (ref 8.9–10.3)
Chloride: 103 mmol/L (ref 98–111)
Creatinine, Ser: 2.08 mg/dL — ABNORMAL HIGH (ref 0.44–1.00)
GFR, Estimated: 22 mL/min — ABNORMAL LOW (ref >60)
Glucose, Bld: 86 mg/dL (ref 70–99)
Potassium: 4.6 mmol/L (ref 3.5–5.1)
Sodium: 136 mmol/L (ref 135–145)

## 2022-02-21 LAB — PROTIME-INR
INR: 1.3 — ABNORMAL HIGH (ref 0.8–1.2)
Prothrombin Time: 16.1 seconds — ABNORMAL HIGH (ref 11.4–15.2)

## 2022-02-21 LAB — POCT I-STAT, CHEM 8
BUN: 48 mg/dL — ABNORMAL HIGH (ref 8–23)
Calcium, Ion: 1.33 mmol/L (ref 1.15–1.40)
Chloride: 101 mmol/L (ref 98–111)
Creatinine, Ser: 2.1 mg/dL — ABNORMAL HIGH (ref 0.44–1.00)
Glucose, Bld: 92 mg/dL (ref 70–99)
HCT: 36 % (ref 36.0–46.0)
Hemoglobin: 12.2 g/dL (ref 12.0–15.0)
Potassium: 5 mmol/L (ref 3.5–5.1)
Sodium: 136 mmol/L (ref 135–145)
TCO2: 26 mmol/L (ref 22–32)

## 2022-02-21 SURGERY — ABDOMINAL AORTOGRAM W/LOWER EXTREMITY
Anesthesia: LOCAL

## 2022-02-21 MED ORDER — DIPHENHYDRAMINE HCL 50 MG/ML IJ SOLN
25.0000 mg | Freq: Once | INTRAMUSCULAR | Status: AC
  Administered 2022-02-21: 25 mg via INTRAVENOUS
  Filled 2022-02-21: qty 1

## 2022-02-21 MED ORDER — SODIUM CHLORIDE 0.9 % IV SOLN: INTRAVENOUS | Status: DC

## 2022-02-21 MED ORDER — METHYLPREDNISOLONE SODIUM SUCC 125 MG IJ SOLR
125.0000 mg | INTRAMUSCULAR | Status: AC
  Administered 2022-02-21: 125 mg via INTRAVENOUS
  Filled 2022-02-21: qty 2

## 2022-02-21 NOTE — Progress Notes (Signed)
VASCULAR SURGERY:  This is a 89 year old woman who came in for an arteriogram because of a small wound on her right foot.  Her creatinine back in March was 1.2.  When she came in today her creatinine was 2.1.  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  I did not think we could do arteriography with adequate visualization given that it was mostly tibial disease and for this reason her arteriogram was canceled.  We will have her evaluated by nephrology and then reschedule her arteriogram taking precautions to prevent renal insufficiency.  I did look at her wound today and is stable on the right foot.  She can resume her Coumadin for now.       Cari Caraway, MD 10:30 AM

## 2022-02-24 ENCOUNTER — Telehealth: Payer: Self-pay

## 2022-02-24 NOTE — Telephone Encounter (Signed)
Patient was scheduled for aortogram with Dr. Edilia Bo on 02/21/22 but canceled due to elevated creatinine. She was advised to make an appointment with [facility], then follow up with Dr. Edilia Bo in office prior to rescheduling procedure.   Contacted patient's daughter, [name] to inquire if nephrology appointment made. She stated, patient has never been seen at [facility] and have been waiting for months to be seen from a prior referral. She has since contacted PCP office to request a referral to another practice and expecting a call soon. [name] will contact our office once she has further information or this appointment has been completed.

## 2022-02-27 ENCOUNTER — Encounter (HOSPITAL_BASED_OUTPATIENT_CLINIC_OR_DEPARTMENT_OTHER): Payer: Medicare HMO | Admitting: Internal Medicine

## 2022-02-27 DIAGNOSIS — L97512 Non-pressure chronic ulcer of other part of right foot with fat layer exposed: Secondary | ICD-10-CM

## 2022-02-27 NOTE — Progress Notes (Addendum)
TAMONICA, [name] ([id number]) Visit Report for 02/27/2022 Chief Complaint Document Details Patient Name: Date of Service: [name], [name] 02/27/2022 2:15 PM Medical Record Number: [id number] Patient Account Number: [id number] Date of Birth/Sex: Treating RN: July 11, 1928 (88 y.o. [name] Primary Care Provider: Jonathon [name] Other Clinician: Referring Provider: Treating Provider/Extender: [name] in Treatment: 4 Information Obtained from: Patient Chief Complaint 01/28/2022; right second toe amputation site dehiscence and bilateral lower extremity wounds. Electronic Signature(s) Signed: 02/27/2022 4:03:56 PM By: Megan Shan DO Entered By: [name] on 02/27/2022 15:21:39 -------------------------------------------------------------------------------- Debridement Details Patient Name: Date of Service: [name]. 02/27/2022 2:15 PM Medical Record Number: [id number] Patient Account Number: [id number] Date of Birth/Sex: Treating RN: [birth year] (88 y.o. [name], [name] Primary Care Provider: Jonathon [name] Other Clinician: Referring Provider: Treating Provider/Extender: [name] in Treatment: 4 Debridement Performed for Assessment: Wound #5 Right T Great oe Performed By: Physician Megan Shan, DO Debridement Type: Debridement Level of Consciousness (Pre-procedure): Awake and Alert Pre-procedure Verification/Time Out Yes - 15:00 Taken: Start Time: 15:01 Pain Control: Lidocaine 5% topical ointment T Area Debrided (L x W): otal 0.4 (cm) x 0.3 (cm) = 0.12 (cm) Tissue and other material debrided: Non-Viable, Slough, Slough Level: Non-Viable Tissue Debridement Description: Selective/Open Wound Instrument: Curette Bleeding: Minimum Hemostasis Achieved: Pressure End Time: 15:05 Procedural Pain: 0 Post Procedural Pain: 0 Response to Treatment: Procedure was tolerated well Level of Consciousness  (Post- Awake and Alert procedure): Post Debridement Measurements of Total Wound Length: (cm) 0.4 Width: (cm) 0.3 Depth: (cm) 0.2 Volume: (cm) 0.019 Character of Wound/Ulcer Post Debridement: Improved Post Procedure Diagnosis Same as Pre-procedure Electronic Signature(s) Signed: 02/27/2022 4:03:56 PM By: Megan Shan DO Signed: 02/27/2022 5:10:40 PM By: Megan Pilling RN, BSN Entered By: [name] on 02/27/2022 15:06:54 -------------------------------------------------------------------------------- Debridement Details Patient Name: Date of Service: [name]. 02/27/2022 2:15 PM Medical Record Number: [id number] Patient Account Number: [id number] Date of Birth/Sex: Treating RN: 05/07/1928 (88 y.o. [name], [name] Primary Care Provider: Jonathon [name] Other Clinician: Referring Provider: Treating Provider/Extender: [name] in Treatment: 4 Debridement Performed for Assessment: Wound #3 Right Amputation Site - Toe Performed By: Physician Megan Shan, DO Debridement Type: Debridement Level of Consciousness (Pre-procedure): Awake and Alert Pre-procedure Verification/Time Out Yes - 15:00 Taken: Start Time: 15:01 Pain Control: Lidocaine 5% topical ointment T Area Debrided (L x W): otal 1 (cm) x 0.2 (cm) = 0.2 (cm) Tissue and other material debrided: Non-Viable, Slough, Slough Level: Non-Viable Tissue Debridement Description: Selective/Open Wound Instrument: Curette Bleeding: Minimum Hemostasis Achieved: Pressure End Time: 15:05 Procedural Pain: 0 Post Procedural Pain: 0 Response to Treatment: Procedure was tolerated well Level of Consciousness (Post- Awake and Alert procedure): Post Debridement Measurements of Total Wound Length: (cm) 1 Width: (cm) 0.2 Depth: (cm) 0.3 Volume: (cm) 0.047 Character of Wound/Ulcer Post Debridement: Improved Post Procedure Diagnosis Same as Pre-procedure Electronic Signature(s) Signed:  02/27/2022 4:03:56 PM By: Megan Shan DO Signed: 02/27/2022 5:10:40 PM By: Megan Pilling RN, BSN Entered By: [name] on 02/27/2022 15:07:13 -------------------------------------------------------------------------------- HPI Details Patient Name: Date of Service: [name]. 02/27/2022 2:15 PM Medical Record Number: [id number] Patient Account Number: [id number] Date of Birth/Sex: Treating RN: 1928-01-27 (88 y.o. [name] Primary Care Provider: Jonathon [name] Other Clinician: Referring Provider: Treating Provider/Extender: [name] in Treatment: 4 History of Present Illness HPI Description: Admission 01/28/2022 [name] is a 88 year old female with a past medical history of  idiopathic peripheral neuropathy status post amputation to the second right toe secondary to osteomyelitis, COPD and A-fib on Coumadin the presents to the clinic for a 13-month history of nonhealing ulcer to a previous amputation site on the second right toe. She states she has tried Medihoney and silver alginate in the past to this area with little benefit. She also has 2 small areas limited to skin breakdown to her lower extremities bilaterally. She has chronic venous insufficiency but not has not been wearing her compression stockings. She states she bumped her legs against an object and not so the wound started. She has been using Medihoney to the sites. She denies signs of infection. 6/2; patient presents for follow-up. She had an x-ray of her right foot done at last clinic visit and this was negative for evidence of osteomyelitis. She also had a wound culture done that showed extra high levels of Staph aureus. I recommended Keystone antibiotics for this and this was ordered. She had ABIs with TBI's done as well that showed monophasic waveforms to the right foot with TBI of 0 and an ABI of 0.52. Urgent referral was made to vein and vascular and she saw Dr. Doren [name]  on 6/1, yesterday and he recommended an arteriogram. This is scheduled for 6/16. Patient also reports a new wound to the right great toe. This is a blister that has ruptured. She also reports increased erythema to the toe. 6/6; the patient was worked in urgently today at the insistence of her daughter out of concern for a new wound on the lateral part of the plantar right great toe. She has her original postsurgical wound after the amputation of the right second toe, she has a wound on the medial part of the right great toe. The patient is apparently going for an angiogram by Dr. Doren [name] in 2 weeks time. 6/13; patient presents for follow-up. She has been using bacitracin to the abrasion on the right great toe. She has been using collagen and Keystone antibiotic to the amputation site. She has no issues or complaints today. She denies signs of infection. 6/22; patient presents for follow-up. She states that her abdominal aortogram was canceled due to her renal function. She has been using Keystone antibiotics to the amputation site and Medihoney to the right great toe wound. At the pace of the right great toe she has a slitlike open area that she thinks was caused by the tape from the dressing. Electronic Signature(s) Signed: 02/27/2022 4:03:56 PM By: Megan Shan DO Entered By: [name] on 02/27/2022 15:24:51 -------------------------------------------------------------------------------- Physical Exam Details Patient Name: Date of Service: [name]. 02/27/2022 2:15 PM Medical Record Number: [id number] Patient Account Number: [id number] Date of Birth/Sex: Treating RN: 16-Jun-1928 (88 y.o. [name] Primary Care Provider: Jonathon [name] Other Clinician: Referring Provider: Treating Provider/Extender: Edgardo Roys Weeks in Treatment: 4 Constitutional respirations regular, non-labored and within target range for patient.[name] Kitchen Psychiatric pleasant and  cooperative. Notes Right foot: T the second digit there is an amputation site that is open with nonviable tissue and granulation tissue. Does not probe to bone. No surrounding o signs of infection. T the lateral aspect of the great toe there is an open wound with nonviable surface And scant granulation tissue. At the base of the right o great toe on the plantar aspect there is a slitlike open wound with granulation tissue throughout. This is very superficial. Electronic Signature(s) Signed: 02/27/2022 4:03:56 PM By: Megan Shan DO Entered By: [name]  on 02/27/2022 15:25:50 -------------------------------------------------------------------------------- Physician Orders Details Patient Name: Date of Service: [name], [name] 02/27/2022 2:15 PM Medical Record Number: [id number] Patient Account Number: [id number] Date of Birth/Sex: Treating RN: March 03, 1928 (88 y.o. [name], [name] Primary Care Provider: Jonathon [name] Other Clinician: Referring Provider: Treating Provider/Extender: [name] in Treatment: 4 Verbal / Phone Orders: No Diagnosis Coding ICD-10 Coding Code Description [phone] Non-pressure chronic ulcer of other part of right foot with fat layer exposed L97.822 Non-pressure chronic ulcer of other part of left lower leg with fat layer exposed G90.09 Other idiopathic peripheral autonomic neuropathy M86.9 Osteomyelitis, unspecified I48.91 Unspecified atrial fibrillation Z79.01 Long term (current) use of anticoagulants I89.0 Lymphedema, not elsewhere classified I87.2 Venous insufficiency (chronic) (peripheral) J44.9 Chronic obstructive pulmonary disease, unspecified Follow-up Appointments ppointment in 1 week. - Dr. Heber [place] and [place], Room 8 Thursday 215pm 03/06/2022 Return A Dr. Heber [place] and [place], Room 8 Thursday 3pm 03/13/2022 Other: - [facility] continue to use when it arrives under the Prisma to right amputation toe  site.*** medihoney to right great toe and right 5th toe. bactracin under the right great toe for protection. Edema Control - Lymphedema / SCD / Other Elevate legs to the level of the heart or above for 30 minutes daily and/or when sitting, a frequency of: - throughout the day. Avoid standing for long periods of time. Home Health No change in wound care orders this week; continue Home Health for wound care. May utilize formulary equivalent dressing for wound treatment orders unless otherwise specified. - Home health weekly dressing changes. All other days daughter to change. medihoney to right great toe and right 5th toe. Bacitracin ointment under right great toe as needed. Other Home Health Orders/Instructions: - [place]. Wound Treatment Wound #3 - Amputation Site - Toe Wound Laterality: Right Cleanser: Soap and Water (Home Health) 1 x Per Day/30 Days Discharge Instructions: May shower and wash wound with dial antibacterial soap and water prior to dressing change. Cleanser: Wound Cleanser (Home Health) 1 x Per Day/30 Days Discharge Instructions: Cleanse the wound with wound cleanser prior to applying a clean dressing using gauze sponges, not tissue or cotton balls. Topical: Compounding T opical Antibiotics (Home Health) 1 x Per Day/30 Days Discharge Instructions: Apply T opical Antibiotics from [facility] daily once it arrives, applies over the [place]. Prim Dressing: Promogran Prisma Matrix, 4.34 (sq in) (silver collagen) (Home Health) 1 x Per Day/30 Days ary Discharge Instructions: Moisten collagen with saline or hydrogel directly to wound bed. apply over the [place] when arrives. Secondary Dressing: Woven Gauze Sponges 2x2 in (Home Health) 1 x Per Day/30 Days Discharge Instructions: Apply over primary dressing. ***Apply a rolled gauze under the right great toe. Secured With: Child psychotherapist, Sterile 2x75 (in/in) (Home Health) 1 x Per Day/30  Days Discharge Instructions: Secure with stretch gauze or use paper tape to secure the gauze. Secured With: Paper Tape, 2x10 (in/yd) (Home Health) 1 x Per Day/30 Days Discharge Instructions: Secure dressing with tape as directed. Wound #5 - T Great oe Wound Laterality: Right Cleanser: Soap and Water (Home Health) 1 x Per Day/30 Days Discharge Instructions: May shower and wash wound with dial antibacterial soap and water prior to dressing change. Prim Dressing: MediHoney Gel, tube 1.5 (oz) (Home Health) 1 x Per Day/30 Days ary Discharge Instructions: Apply to wound bed as instructed Secondary Dressing: Woven Gauze Sponges 2x2 in ([place]) 1 x Per Day/30 Days Discharge Instructions: Apply over primary dressing ensure rolled  2x2 gauze to keep toe straight. Secured With: Child psychotherapist, Sterile 2x75 (in/in) (Home Health) 1 x Per Day/30 Days Discharge Instructions: Secure with stretch gauze or use paper tape to secure the gauze. Electronic Signature(s) Signed: 02/27/2022 4:03:56 PM By: Megan Shan DO Entered By: [name] on 02/27/2022 15:26:02 -------------------------------------------------------------------------------- Problem List Details Patient Name: Date of Service: [name]. 02/27/2022 2:15 PM Medical Record Number: [id number] Patient Account Number: [id number] Date of Birth/Sex: Treating RN: 01-Jun-1928 (88 y.o. [name], Tammi Klippel Primary Care Provider: Jonathon [name] Other Clinician: Referring Provider: Treating Provider/Extender: [name] in Treatment: 4 Active Problems ICD-10 Encounter Code Description Active Date MDM Diagnosis [phone] Non-pressure chronic ulcer of other part of right foot with fat layer exposed 01/28/2022 No Yes L97.822 Non-pressure chronic ulcer of other part of left lower leg with fat layer exposed5/23/2023 No Yes G90.09 Other idiopathic peripheral autonomic neuropathy 01/28/2022 No  Yes M86.9 Osteomyelitis, unspecified 01/28/2022 No Yes I48.91 Unspecified atrial fibrillation 01/28/2022 No Yes Z79.01 Long term (current) use of anticoagulants 01/28/2022 No Yes I89.0 Lymphedema, not elsewhere classified 01/28/2022 No Yes I87.2 Venous insufficiency (chronic) (peripheral) 01/28/2022 No Yes J44.9 Chronic obstructive pulmonary disease, unspecified 01/28/2022 No Yes Inactive Problems ICD-10 Code Description Active Date Inactive Date L97.812 Non-pressure chronic ulcer of other part of right lower leg with fat layer exposed 01/28/2022 01/28/2022 Resolved Problems Electronic Signature(s) Signed: 02/27/2022 4:03:56 PM By: Megan Shan DO Entered By: [name] on 02/27/2022 15:20:59 -------------------------------------------------------------------------------- Progress Note Details Patient Name: Date of Service: [name]. 02/27/2022 2:15 PM Medical Record Number: [id number] Patient Account Number: [id number] Date of Birth/Sex: Treating RN: [birth year] (88 y.o. [name] Primary Care Provider: Jonathon [name] Other Clinician: Referring Provider: Treating Provider/Extender: [name] in Treatment: 4 Subjective Chief Complaint Information obtained from Patient 01/28/2022; right second toe amputation site dehiscence and bilateral lower extremity wounds. History of Present Illness (HPI) Admission 01/28/2022 [name] is a 88 year old female with a past medical history of idiopathic peripheral neuropathy status post amputation to the second right toe secondary to osteomyelitis, COPD and A-fib on Coumadin the presents to the clinic for a 23-month history of nonhealing ulcer to a previous amputation site on the second right toe. She states she has tried Medihoney and silver alginate in the past to this area with little benefit. She also has 2 small areas limited to skin breakdown to her lower extremities bilaterally. She has chronic  venous insufficiency but not has not been wearing her compression stockings. She states she bumped her legs against an object and not so the wound started. She has been using Medihoney to the sites. She denies signs of infection. 6/2; patient presents for follow-up. She had an x-ray of her right foot done at last clinic visit and this was negative for evidence of osteomyelitis. She also had a wound culture done that showed extra high levels of Staph aureus. I recommended Keystone antibiotics for this and this was ordered. She had ABIs with TBI's done as well that showed monophasic waveforms to the right foot with TBI of 0 and an ABI of 0.52. Urgent referral was made to vein and vascular and she saw Dr. Doren [name] on 6/1, yesterday and he recommended an arteriogram. This is scheduled for 6/16. Patient also reports a new wound to the right great toe. This is a blister that has ruptured. She also reports increased erythema to the toe. 6/6; the patient was worked in urgently today  at the insistence of her daughter out of concern for a new wound on the lateral part of the plantar right great toe. She has her original postsurgical wound after the amputation of the right second toe, she has a wound on the medial part of the right great toe. The patient is apparently going for an angiogram by Dr. Doren [name] in 2 weeks time. 6/13; patient presents for follow-up. She has been using bacitracin to the abrasion on the right great toe. She has been using collagen and Keystone antibiotic to the amputation site. She has no issues or complaints today. She denies signs of infection. 6/22; patient presents for follow-up. She states that her abdominal aortogram was canceled due to her renal function. She has been using Keystone antibiotics to the amputation site and Medihoney to the right great toe wound. At the pace of the right great toe she has a slitlike open area that she thinks was caused by the tape from the  dressing. Patient History Information obtained from Patient, Caregiver. Family History Cancer - Siblings, Heart Disease, Stroke - Mother, No family history of Diabetes. Social History Former smoker - quit 2003, Marital Status - Widowed, Alcohol Use - Never, Drug Use - No History, Caffeine Use - Never. Medical History Eyes Patient has history of Cataracts Hematologic/Lymphatic Patient has history of Anemia Respiratory Denies history of Chronic Obstructive Pulmonary Disease (COPD) Cardiovascular Patient has history of Congestive Heart Failure, Hypotension, Peripheral Venous Disease Musculoskeletal Patient has history of 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 Neurologic Patient has history of Neuropathy Medical A Surgical History Notes nd Hematologic/Lymphatic Thrombocytopenia Endocrine Hyperthyroidism, Hypothyroidism Objective Constitutional respirations regular, non-labored and within target range for patient.. Vitals Time Taken: 2:35 PM, Height: 62 in, Weight: 181 lbs, BMI: 33.1, Temperature: 98 F, Pulse: 70 bpm, Respiratory Rate: 18 breaths/min, Blood Pressure: 166/68 mmHg. Psychiatric pleasant and cooperative. General Notes: Right foot: T the second digit there is an amputation site that is open with nonviable tissue and granulation tissue. Does not probe to bone. No o surrounding signs of infection. T the lateral aspect of the great toe there is an open wound with nonviable surface And scant granulation tissue. At the base of o the right great toe on the plantar aspect there is a slitlike open wound with granulation tissue throughout. This is very superficial. Integumentary (Hair, Skin) Wound #3 status is Open. Original cause of wound was Surgical Injury. The date acquired was: 11/17/2021. The wound has been in treatment 4 weeks. The wound is located on the Right Amputation Site - T The wound measures 1cm length x 0.2cm width x 0.3cm depth; 0.157cm^2 area and  0.047cm^3 volume. oe. There is Fat Layer (Subcutaneous Tissue) exposed. There is no tunneling or undermining noted. There is a medium amount of serosanguineous drainage noted. The wound margin is epibole. There is no granulation within the wound bed. There is a large (67-100%) amount of necrotic tissue within the wound bed including Adherent Slough. Wound #5 status is Open. Original cause of wound was Shear/Friction. The date acquired was: 02/06/2022. The wound has been in treatment 2 weeks. The wound is located on the Right T Great. The wound measures 0.4cm length x 0.3cm width x 0.2cm depth; 0.094cm^2 area and 0.019cm^3 volume. There is Fat Layer oe (Subcutaneous Tissue) exposed. There is no tunneling or undermining noted. There is a medium amount of serosanguineous drainage noted. The wound margin is distinct with the outline attached to the wound base. There is medium (34-66%)  pink, pale granulation within the wound bed. There is no necrotic tissue within the wound bed. Assessment Active Problems ICD-10 Non-pressure chronic ulcer of other part of right foot with fat layer exposed Non-pressure chronic ulcer of other part of left lower leg with fat layer exposed Other idiopathic peripheral autonomic neuropathy Osteomyelitis, unspecified Unspecified atrial fibrillation Long term (current) use of anticoagulants Lymphedema, not elsewhere classified Venous insufficiency (chronic) (peripheral) Chronic obstructive pulmonary disease, unspecified Unfortunately patient's arteriogram had to be canceled due to her renal function. Per patient they are repeating her blood work. Her wounds have shown slight improvement in appearance and size since last clinic visit. I debrided nonviable tissue. I recommended continuing Medihoney to the right great toe wound and Keystone antibiotics to the amputation site. She has a superficial slitlike wound to the base of the right great toe that I recommended just  placing antibiotic ointment and not putting any pressure to this area with bandage. Follow-up in 1 week. Procedures Wound #3 Pre-procedure diagnosis of Wound #3 is an Open Surgical Wound located on the Right Amputation Site - T . There was a Selective/Open Wound Non-Viable oe Tissue Debridement with a total area of 0.2 sq cm performed by Megan Shan, DO. With the following instrument(s): Curette to remove Non-Viable tissue/material. Material removed includes [facility] after achieving pain control using Lidocaine 5% topical ointment. A time out was conducted at 15:00, prior to the start of the procedure. A Minimum amount of bleeding was controlled with Pressure. The procedure was tolerated well with a pain level of 0 throughout and a pain level of 0 following the procedure. Post Debridement Measurements: 1cm length x 0.2cm width x 0.3cm depth; 0.047cm^3 volume. Character of Wound/Ulcer Post Debridement is improved. Post procedure Diagnosis Wound #3: Same as Pre-Procedure Wound #5 Pre-procedure diagnosis of Wound #5 is an Abrasion located on the Right T Great . There was a Selective/Open Wound Non-Viable Tissue Debridement with a oe total area of 0.12 sq cm performed by Megan Shan, DO. With the following instrument(s): Curette to remove Non-Viable tissue/material. Material removed includes [facility] after achieving pain control using Lidocaine 5% topical ointment. A time out was conducted at 15:00, prior to the start of the procedure. A Minimum amount of bleeding was controlled with Pressure. The procedure was tolerated well with a pain level of 0 throughout and a pain level of 0 following the procedure. Post Debridement Measurements: 0.4cm length x 0.3cm width x 0.2cm depth; 0.019cm^3 volume. Character of Wound/Ulcer Post Debridement is improved. Post procedure Diagnosis Wound #5: Same as Pre-Procedure Plan Follow-up Appointments: Return Appointment in 1 week. - Dr. Heber [place] and [place], Room  8 Thursday 215pm 03/06/2022 Dr. Heber [place] and [place], Room 8 Thursday 3pm 03/13/2022 Other: - [facility] continue to use when it arrives under the Prisma to right amputation toe site.*** medihoney to right great toe and right 5th toe. bactracin under the right great toe for protection. Edema Control - Lymphedema / SCD / Other: Elevate legs to the level of the heart or above for 30 minutes daily and/or when sitting, a frequency of: - throughout the day. Avoid standing for long periods of time. Home Health: No change in wound care orders this week; continue Home Health for wound care. May utilize formulary equivalent dressing for wound treatment orders unless otherwise specified. - Home health weekly dressing changes. All other days daughter to change. medihoney to right great toe and right 5th toe. Bacitracin ointment under right great toe as needed. Other Home Health  Orders/Instructions: - [facility] [facility] WOUND #3: - Amputation Site - T oe Wound Laterality: Right Cleanser: Soap and Water (Home Health) 1 x Per Day/30 Days Discharge Instructions: May shower and wash wound with dial antibacterial soap and water prior to dressing change. Cleanser: Wound Cleanser (Home Health) 1 x Per Day/30 Days Discharge Instructions: Cleanse the wound with wound cleanser prior to applying a clean dressing using gauze sponges, not tissue or cotton balls. Topical: Compounding T opical Antibiotics (Home Health) 1 x Per Day/30 Days Discharge Instructions: Apply T opical Antibiotics from [facility] daily once it arrives, applies over the [place]. Prim Dressing: Promogran Prisma Matrix, 4.34 (sq in) (silver collagen) (Home Health) 1 x Per Day/30 Days ary Discharge Instructions: Moisten collagen with saline or hydrogel directly to wound bed. apply over the [place] when arrives. Secondary Dressing: Woven Gauze Sponges 2x2 in (Home Health) 1 x Per Day/30 Days Discharge Instructions: Apply over primary  dressing. ***Apply a rolled gauze under the right great toe. Secured With: Insurance underwriter, Sterile 2x75 (in/in) (Home Health) 1 x Per Day/30 Days Discharge Instructions: Secure with stretch gauze or use paper tape to secure the gauze. Secured With: Paper T ape, 2x10 (in/yd) (Home Health) 1 x Per Day/30 Days Discharge Instructions: Secure dressing with tape as directed. WOUND #5: - T Great Wound Laterality: Right oe Cleanser: Soap and Water (Home Health) 1 x Per Day/30 Days Discharge Instructions: May shower and wash wound with dial antibacterial soap and water prior to dressing change. Prim Dressing: MediHoney Gel, tube 1.5 (oz) (Home Health) 1 x Per Day/30 Days ary Discharge Instructions: Apply to wound bed as instructed Secondary Dressing: Woven Gauze Sponges 2x2 in (Home Health) 1 x Per Day/30 Days Discharge Instructions: Apply over primary dressing ensure rolled 2x2 gauze to keep toe straight. Secured With: Insurance underwriter, Sterile 2x75 (in/in) (Home Health) 1 x Per Day/30 Days Discharge Instructions: Secure with stretch gauze or use paper tape to secure the gauze. 1. In office sharp debridement 2. Keystone antibiotics 3. Medihoney 4. Bacitracin ointment 5. Follow-up in 1 week Electronic Signature(s) Signed: 02/27/2022 4:03:56 PM By: Geralyn Corwin DO Entered By: Geralyn Corwin on 02/27/2022 15:28:58 -------------------------------------------------------------------------------- HxROS Details Patient Name: Date of Service: [name]. 02/27/2022 2:15 PM Medical Record Number: [id number] Patient Account Number: [id number] Date of Birth/Sex: Treating RN: 07/30/1928 (88 y.o. [name] Primary Care Provider: [name] Other Clinician: Referring Provider: Treating Provider/Extender: [name] in Treatment: 4 Information Obtained From Patient Caregiver Eyes Medical History: Positive for:  Cataracts Hematologic/Lymphatic Medical History: Positive for: Anemia Past Medical History Notes: Thrombocytopenia Respiratory Medical History: Negative for: Chronic Obstructive Pulmonary Disease (COPD) Cardiovascular Medical History: Positive for: Congestive Heart Failure; Hypotension; Peripheral Venous Disease Endocrine Medical History: Past Medical History Notes: Hyperthyroidism, Hypothyroidism Musculoskeletal Medical History: Positive for: 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 Neurologic Medical History: Positive for: Neuropathy HBO Extended History Items Eyes: Cataracts Immunizations Pneumococcal Vaccine: Received Pneumococcal Vaccination: No Implantable Devices None Family and Social History Cancer: Yes - Siblings; Diabetes: No; Heart Disease: Yes; Stroke: Yes - Mother; Former smoker - quit 2003; Marital Status - Widowed; Alcohol Use: Never; Drug Use: No History; Caffeine Use: Never; Financial Concerns: No; Food, Clothing or Shelter Needs: No; Support System Lacking: No; Transportation Concerns: No Electronic Signature(s) Signed: 02/27/2022 4:03:56 PM By: Geralyn Corwin DO Signed: 02/27/2022 5:10:40 PM By: Shawn Stall RN, BSN Entered By: Geralyn Corwin on 02/27/2022 15:25:00 -------------------------------------------------------------------------------- SuperBill Details Patient Name:  Date of Service: BIVIANA, SADDLER 02/27/2022 Medical Record Number: [id number] Patient Account Number: [id number] Date of Birth/Sex: Treating RN: 1928/05/21 (88 y.o. [name], [name] Primary Care Provider: [name] Other Clinician: Referring Provider: Treating Provider/Extender: [name] in Treatment: 4 Diagnosis Coding ICD-10 Codes Code Description [phone] Non-pressure chronic ulcer of other part of right foot with fat layer exposed L97.822 Non-pressure chronic ulcer of other part of left lower leg with fat layer  exposed G90.09 Other idiopathic peripheral autonomic neuropathy M86.9 Osteomyelitis, unspecified I48.91 Unspecified atrial fibrillation Z79.01 Long term (current) use of anticoagulants I89.0 Lymphedema, not elsewhere classified I87.2 Venous insufficiency (chronic) (peripheral) J44.9 Chronic obstructive pulmonary disease, unspecified Facility Procedures CPT4 Code: [id number] Description: [phone] - DEBRIDE WOUND 1ST 20 SQ CM OR < ICD-10 Diagnosis Description L97.512 Non-pressure chronic ulcer of other part of right foot with fat layer exposed Modifier: Quantity: 1 Physician Procedures : CPT4 Code Description Modifier [id number] - WC PHYS DEBR WO ANESTH 20 SQ CM ICD-10 Diagnosis Description L97.512 Non-pressure chronic ulcer of other part of right foot with fat layer exposed Quantity: 1 Electronic Signature(s) Signed: 02/27/2022 4:03:56 PM By: Geralyn Corwin DO Entered By: Geralyn Corwin on 02/27/2022 15:29:15

## 2022-03-06 ENCOUNTER — Encounter (HOSPITAL_BASED_OUTPATIENT_CLINIC_OR_DEPARTMENT_OTHER): Payer: Medicare HMO | Admitting: Internal Medicine

## 2022-03-06 DIAGNOSIS — L97512 Non-pressure chronic ulcer of other part of right foot with fat layer exposed: Secondary | ICD-10-CM | POA: Diagnosis not present

## 2022-03-06 NOTE — Progress Notes (Signed)
FERNE, ELLINGWOOD ([id number]) Visit Report for 03/06/2022 Arrival Information Details Patient Name: Date of Service: RAKISHA, PINCOCK 03/06/2022 2:15 PM Medical Record Number: [id number] Patient Account Number: [id number] Date of Birth/Sex: Treating RN: [birth year] (88 y.o. [name], [name] Primary Care Quame Spratlin: [name] Other Clinician: Referring Raelle Chambers: Treating Thresea Doble/Extender: [name] in Treatment: 5 Visit Information History Since Last Visit Added or deleted any medications: No Patient Arrived: Wheel Chair Any new allergies or adverse reactions: No Arrival Time: 14:47 Had a fall or experienced change in No Accompanied By: daughter activities of daily living that may affect Transfer Assistance: None risk of falls: Patient Identification Verified: Yes Signs or symptoms of abuse/neglect since No Secondary Verification Process Completed: Yes last visito Patient Requires Transmission-Based Precautions: No Hospitalized since last visit: No Patient Has Alerts: Yes Implantable device outside of the clinic No Patient Alerts: Patient on Blood Thinner excluding cellular tissue based products placed in the center since last visit: Has Dressing in Place as Prescribed: Yes Has Footwear/Offloading in Place as Yes Prescribed: Right: Surgical Shoe with Pressure Relief Insole Pain Present Now: No Electronic Signature(s) Signed: 03/06/2022 5:00:34 PM By: Shawn Stall RN, BSN Entered By: Shawn Stall on 03/06/2022 14:48:10 -------------------------------------------------------------------------------- Encounter Discharge Information Details Patient Name: Date of Service: [name] 03/06/2022 2:15 PM Medical Record Number: [id number] Patient Account Number: [id number] Date of Birth/Sex: Treating RN: [birth year] (88 y.o. [name], [name] Primary Care Kirandeep Fariss: [name] Other Clinician: Referring Luie Laneve: Treating Terance Pomplun/Extender:  [name] in Treatment: 5 Encounter Discharge Information Items Post Procedure Vitals Discharge Condition: Stable Temperature (F): 98.2 Ambulatory Status: Wheelchair Pulse (bpm): 65 Discharge Destination: Home Respiratory Rate (breaths/min): 20 Transportation: Private Auto Blood Pressure (mmHg): 144/73 Accompanied By: daughter Schedule Follow-up Appointment: Yes Clinical Summary of Care: Electronic Signature(s) Signed: 03/06/2022 5:00:34 PM By: Shawn Stall RN, BSN Entered By: Shawn Stall on 03/06/2022 15:24:00 -------------------------------------------------------------------------------- Lower Extremity Assessment Details Patient Name: Date of Service: [name] 03/06/2022 2:15 PM Medical Record Number: [id number] Patient Account Number: [id number] Date of Birth/Sex: Treating RN: April 30, 1928 (88 y.o. [name], [name] Primary Care Sarahelizabeth Conway: [name] Other Clinician: Referring Zehra Rucci: Treating Cherish Runde/Extender: [name] Weeks in Treatment: 5 Edema Assessment Assessed: [Left: No] [Right: No] Edema: [Left: Yes] [Right: Yes] Calf Left: Right: Point of Measurement: 30 cm From Medial Instep 39 cm Ankle Left: Right: Point of Measurement: 9 cm From Medial Instep 24 cm Electronic Signature(s) Signed: 03/06/2022 5:00:34 PM By: Shawn Stall RN, BSN Entered By: Shawn Stall on 03/06/2022 14:56:53 -------------------------------------------------------------------------------- Multi Wound Chart Details Patient Name: Date of Service: [name] 03/06/2022 2:15 PM Medical Record Number: [id number] Patient Account Number: [id number] Date of Birth/Sex: Treating RN: 04-10-1928 (88 y.o. [name], [name] Primary Care Jagjit Riner: [name] Other Clinician: Referring Lachelle Rissler: Treating Walid Haig/Extender: [name] in Treatment: 5 Vital Signs Height(in): 62 Pulse(bpm):  65 Weight(lbs): 181 Blood Pressure(mmHg): 144/73 Body Mass Index(BMI): 33.1 Temperature(F): 98.2 Respiratory Rate(breaths/min): 20 Photos: [N/A:N/A] Right Amputation Site - Toe Right T Great oe N/A Wound Location: Surgical Injury Shear/Friction N/A Wounding Event: Open Surgical Wound Abrasion N/A Primary Etiology: Cataracts, Anemia, Congestive Heart Cataracts, Anemia, Congestive Heart N/A Comorbid History: Failure, Hypotension, Peripheral Failure, Hypotension, Peripheral Venous Disease, Osteomyelitis, Venous Disease, Osteomyelitis, Neuropathy Neuropathy 11/17/2021 02/06/2022 N/A Date Acquired: 5 3 N/A Weeks of Treatment: Open Open N/A Wound Status: No No N/A Wound Recurrence: 0.7x0.3x0.7 0.3x0.4x0.2 N/A Measurements L x W x D (cm) 0.165  0.094 N/A A (cm) : rea 0.115 0.019 N/A Volume (cm) : 61.80% 75.60% N/A % Reduction in A rea: 46.80% 50.00% N/A % Reduction in Volume: 12 Position 1 (o'clock): 0.5 Maximum Distance 1 (cm): Yes No N/A Tunneling: Full Thickness Without Exposed Full Thickness Without Exposed N/A Classification: Support Structures Support Structures Medium Medium N/A Exudate A mount: Serosanguineous Serosanguineous N/A Exudate Type: red, brown red, brown N/A Exudate Color: Epibole Distinct, outline attached N/A Wound Margin: Large (67-100%) Medium (34-66%) N/A Granulation A mount: Red Pink, Pale N/A Granulation Quality: None Present (0%) None Present (0%) N/A Necrotic A mount: Fat Layer (Subcutaneous Tissue): Yes Fat Layer (Subcutaneous Tissue): Yes N/A Exposed Structures: Fascia: No Fascia: No Tendon: No Tendon: No Muscle: No Muscle: No Joint: No Joint: No Bone: No Bone: No None None N/A Epithelialization: Debridement - Excisional Debridement - Excisional N/A Debridement: Pre-procedure Verification/Time Out 15:10 15:10 N/A Taken: Lidocaine 5% topical ointment Lidocaine 5% topical ointment N/A Pain Control: Tendon,  Subcutaneous, Slough Subcutaneous, Slough N/A Tissue Debrided: Skin/Subcutaneous Tissue/Muscle Skin/Subcutaneous Tissue N/A Level: 0.21 0.12 N/A Debridement A (sq cm): rea Curette Curette N/A Instrument: Minimum Minimum N/A Bleeding: 0 0 N/A Procedural Pain: 0 0 N/A Post Procedural Pain: Procedure was tolerated well Procedure was tolerated well N/A Debridement Treatment Response: 0.7x0.3x0.7 0.3x0.4x0.2 N/A Post Debridement Measurements L x W x D (cm) 0.115 0.019 N/A Post Debridement Volume: (cm) Debridement Debridement N/A Procedures Performed: Treatment Notes Wound #3 (Amputation Site - Toe) Wound Laterality: Right Cleanser Soap and Water Discharge Instruction: May shower and wash wound with dial antibacterial soap and water prior to dressing change. Wound Cleanser Discharge Instruction: Cleanse the wound with wound cleanser prior to applying a clean dressing using gauze sponges, not tissue or cotton balls. Peri-Wound Care Topical Compounding T opical Antibiotics Discharge Instruction: Apply T opical Antibiotics from [facility] daily once it arrives, applies over the [place]. Primary Dressing Promogran Prisma Matrix, 4.34 (sq in) (silver collagen) Discharge Instruction: Moisten collagen with saline or hydrogel directly to wound bed. apply over the [place] when arrives. Secondary Dressing Woven Gauze Sponges 2x2 in Discharge Instruction: Apply over primary dressing. ***Apply a rolled gauze under the right great toe. Secured With Conforming Stretch Gauze Bandage, Sterile 2x75 (in/in) Discharge Instruction: Secure with stretch gauze or use paper tape to secure the gauze. Paper Tape, 2x10 (in/yd) Discharge Instruction: Secure dressing with tape as directed. Compression Wrap Compression Stockings Add-Ons Wound #5 (Toe Great) Wound Laterality: Right Cleanser Soap and Water Discharge Instruction: May shower and wash wound with dial antibacterial soap and water  prior to dressing change. Wound Cleanser Discharge Instruction: Cleanse the wound with wound cleanser prior to applying a clean dressing using gauze sponges, not tissue or cotton balls. Peri-Wound Care Topical Compounding T opical Antibiotics Discharge Instruction: Apply T opical Antibiotics from [facility] daily once it arrives, applies over the [place]. Primary Dressing Promogran Prisma Matrix, 4.34 (sq in) (silver collagen) Discharge Instruction: Moisten collagen with saline or hydrogel directly to wound bed. apply over the [place] when arrives. Secondary Dressing Woven Gauze Sponges 2x2 in Discharge Instruction: Apply over primary dressing. may tape down with medipore tape. Secured With Conforming Stretch Gauze Bandage, Sterile 2x75 (in/in) Discharge Instruction: Secure with stretch gauze or use paper tape to secure the gauze. Paper Tape, 2x10 (in/yd) Discharge Instruction: Secure dressing with tape as directed. Compression Wrap Compression Stockings Add-Ons Electronic Signature(s) Signed: 03/06/2022 3:54:07 PM By: Kalman Shan DO Signed: 03/06/2022 5:00:34 PM By: Deon Pilling RN, BSN Entered By: Heber Beecher,  Jessica on 03/06/2022 15:29:04 -------------------------------------------------------------------------------- Multi-Disciplinary Care Plan Details Patient Name: Date of Service: ERINA, GIORGIANNI 03/06/2022 2:15 PM Medical Record Number: [id number] Patient Account Number: [id number] Date of Birth/Sex: Treating RN: 02-Jul-1928 (88 y.o. [name], Tammi Klippel Primary Care Myishia Kasik: [name] Other Clinician: Referring Draysen Weygandt: Treating Oneda Duffett/Extender: Theodosia Quay in Treatment: 5 Active Inactive Nutrition Nursing Diagnoses: Potential for alteratiion in Nutrition/Potential for imbalanced nutrition Goals: Patient/caregiver agrees to and verbalizes understanding of need to use nutritional supplements and/or vitamins as prescribed Date  Initiated: 01/28/2022 Target Resolution Date: 04/04/2022 Goal Status: Active Interventions: Assess patient nutrition upon admission and as needed per policy Provide education on nutrition Treatment Activities: Patient referred to Primary Care Physician for further nutritional evaluation : 01/28/2022 Notes: Pain, Acute or Chronic Nursing Diagnoses: Pain, acute or chronic: actual or potential Potential alteration in comfort, pain Goals: Patient will verbalize adequate pain control and receive pain control interventions during procedures as needed Date Initiated: 01/28/2022 Target Resolution Date: 04/04/2022 Goal Status: Active Interventions: Encourage patient to take pain medications as prescribed Provide education on pain management Treatment Activities: Administer pain control measures as ordered : 01/28/2022 Notes: Wound/Skin Impairment Nursing Diagnoses: Knowledge deficit related to ulceration/compromised skin integrity Goals: Patient/caregiver will verbalize understanding of skin care regimen Date Initiated: 01/28/2022 Target Resolution Date: 04/04/2022 Goal Status: Active Interventions: Assess patient/caregiver ability to perform ulcer/skin care regimen upon admission and as needed Assess ulceration(s) every visit Provide education on ulcer and skin care Treatment Activities: Skin care regimen initiated : 01/28/2022 Topical wound management initiated : 01/28/2022 Notes: Electronic Signature(s) Signed: 03/06/2022 5:00:34 PM By: Deon Pilling RN, BSN Entered By: Deon Pilling on 03/06/2022 14:58:04 -------------------------------------------------------------------------------- Pain Assessment Details Patient Name: Date of Service: Alonza Bogus. 03/06/2022 2:15 PM Medical Record Number: [id number] Patient Account Number: [id number] Date of Birth/Sex: Treating RN: [birth year] (88 y.o. [name] Primary Care Reed Dady: [name] Other Clinician: Referring  Ladell Bey: Treating Bernardo Brayman/Extender: Theodosia Quay in Treatment: 5 Active Problems Location of Pain Severity and Description of Pain Patient Has Paino No Site Locations Rate the pain. Rate the pain. Current Pain Level: 0 Pain Management and Medication Current Pain Management: Medication: No Cold Application: No Rest: No Massage: No Activity: No T.E.N.S.: No Heat Application: No Leg drop or elevation: No Is the Current Pain Management Adequate: Adequate How does your wound impact your activities of daily livingo Sleep: No Bathing: No Appetite: No Relationship With Others: No Bladder Continence: No Emotions: No Bowel Continence: No Work: No Toileting: No Drive: No Dressing: No Hobbies: No Engineer, maintenance) Signed: 03/06/2022 5:00:34 PM By: Deon Pilling RN, BSN Entered By: Deon Pilling on 03/06/2022 14:48:35 -------------------------------------------------------------------------------- Patient/Caregiver Education Details Patient Name: Date of Service: Alonza Bogus 6/29/2023andnbsp2:15 PM Medical Record Number: [id number] Patient Account Number: [id number] Date of Birth/Gender: Treating RN: 14-Jul-1928 (88 y.o. [name] Primary Care Physician: [name] Other Clinician: Referring Physician: Treating Physician/Extender: Theodosia Quay in Treatment: 5 Education Assessment Education Provided To: Patient Education Topics Provided Wound/Skin Impairment: Handouts: Skin Care Do's and Dont's Methods: Explain/Verbal Responses: Reinforcements needed Electronic Signature(s) Signed: 03/06/2022 5:00:34 PM By: Deon Pilling RN, BSN Entered By: Deon Pilling on 03/06/2022 14:58:15 -------------------------------------------------------------------------------- Wound Assessment Details Patient Name: Date of Service: Alonza Bogus. 03/06/2022 2:15 PM Medical Record Number: [id number] Patient  Account Number: [id number] Date of Birth/Sex: Treating RN: 1928-02-21 (88 y.o. [name], Tammi Klippel Primary Care Angelica Wix: [name] Other Clinician: Referring Amonie Wisser: Treating Sung [place]/Extender: Heber Hand  Beverlyn Roux, Jasmine December Weeks in Treatment: 5 Wound Status Wound Number: 3 Primary Open Surgical Wound Etiology: Wound Location: Right Amputation Site - Toe Wound Open Wounding Event: Surgical Injury Status: Date Acquired: 11/17/2021 Comorbid Cataracts, Anemia, Congestive Heart Failure, Hypotension, Weeks Of Treatment: 5 History: Peripheral Venous Disease, Osteomyelitis, Neuropathy Clustered Wound: No Photos Wound Measurements Length: (cm) 0.7 Width: (cm) 0.3 Depth: (cm) 0.7 Area: (cm) 0.165 Volume: (cm) 0.115 % Reduction in Area: 61.8% % Reduction in Volume: 46.8% Epithelialization: None Tunneling: Yes Position (o'clock): 12 Maximum Distance: (cm) 0.5 Wound Description Classification: Full Thickness Without Exposed Support Structures Wound Margin: Epibole Exudate Amount: Medium Exudate Type: Serosanguineous Exudate Color: red, brown Foul Odor After Cleansing: No Slough/Fibrino Yes Wound Bed Granulation Amount: Large (67-100%) Exposed Structure Granulation Quality: Red Fascia Exposed: No Necrotic Amount: None Present (0%) Fat Layer (Subcutaneous Tissue) Exposed: Yes Tendon Exposed: No Muscle Exposed: No Joint Exposed: No Bone Exposed: No Treatment Notes Wound #3 (Amputation Site - Toe) Wound Laterality: Right Cleanser Soap and Water Discharge Instruction: May shower and wash wound with dial antibacterial soap and water prior to dressing change. Wound Cleanser Discharge Instruction: Cleanse the wound with wound cleanser prior to applying a clean dressing using gauze sponges, not tissue or cotton balls. Peri-Wound Care Topical Compounding T opical Antibiotics Discharge Instruction: Apply T opical Antibiotics from [facility] daily once it arrives,  applies over the [place]. Primary Dressing Promogran Prisma Matrix, 4.34 (sq in) (silver collagen) Discharge Instruction: Moisten collagen with saline or hydrogel directly to wound bed. apply over the [place] when arrives. Secondary Dressing Woven Gauze Sponges 2x2 in Discharge Instruction: Apply over primary dressing. ***Apply a rolled gauze under the right great toe. Secured With Conforming Stretch Gauze Bandage, Sterile 2x75 (in/in) Discharge Instruction: Secure with stretch gauze or use paper tape to secure the gauze. Paper Tape, 2x10 (in/yd) Discharge Instruction: Secure dressing with tape as directed. Compression Wrap Compression Stockings Add-Ons Electronic Signature(s) Signed: 03/06/2022 5:00:34 PM By: Shawn Stall RN, BSN Entered By: Shawn Stall on 03/06/2022 14:55:32 -------------------------------------------------------------------------------- Wound Assessment Details Patient Name: Date of Service: [name] 03/06/2022 2:15 PM Medical Record Number: [id number] Patient Account Number: [id number] Date of Birth/Sex: Treating RN: 03/20/1928 (88 y.o. [name], [name] Primary Care Vira Chaplin: [name] Other Clinician: Referring Denette Hass: Treating Odella Appelhans/Extender: [name] Weeks in Treatment: 5 Wound Status Wound Number: 5 Primary Abrasion Etiology: Wound Location: Right T Great oe Wound Open Wounding Event: Shear/Friction Status: Date Acquired: 02/06/2022 Comorbid Cataracts, Anemia, Congestive Heart Failure, Hypotension, Weeks Of Treatment: 3 History: Peripheral Venous Disease, Osteomyelitis, Neuropathy Clustered Wound: No Photos Wound Measurements Length: (cm) 0.3 Width: (cm) 0.4 Depth: (cm) 0.2 Area: (cm) 0.094 Volume: (cm) 0.019 Wound Description Classification: Full Thickness Without Exposed Support Structu Wound Margin: Distinct, outline attached Exudate Amount: Medium Exudate Type: Serosanguineous Exudate  Color: red, brown Foul Odor After Cleansing: Slough/Fibrino % Reduction in Area: 75.6% % Reduction in Volume: 50% Epithelialization: None Tunneling: No Undermining: No res No Yes Wound Bed Granulation Amount: Medium (34-66%) Exposed Structure Granulation Quality: Pink, Pale Fascia Exposed: No Necrotic Amount: None Present (0%) Fat Layer (Subcutaneous Tissue) Exposed: Yes Tendon Exposed: No Muscle Exposed: No Joint Exposed: No Bone Exposed: No Treatment Notes Wound #5 (Toe Great) Wound Laterality: Right Cleanser Soap and Water Discharge Instruction: May shower and wash wound with dial antibacterial soap and water prior to dressing change. Wound Cleanser Discharge Instruction: Cleanse the wound with wound cleanser prior to applying a clean dressing using gauze sponges, not  tissue or cotton balls. Peri-Wound Care Topical Compounding T opical Antibiotics Discharge Instruction: Apply T opical Antibiotics from [facility] daily once it arrives, applies over the [place]. Primary Dressing Promogran Prisma Matrix, 4.34 (sq in) (silver collagen) Discharge Instruction: Moisten collagen with saline or hydrogel directly to wound bed. apply over the [place] when arrives. Secondary Dressing Woven Gauze Sponges 2x2 in Discharge Instruction: Apply over primary dressing. may tape down with medipore tape. Secured With Conforming Stretch Gauze Bandage, Sterile 2x75 (in/in) Discharge Instruction: Secure with stretch gauze or use paper tape to secure the gauze. Paper Tape, 2x10 (in/yd) Discharge Instruction: Secure dressing with tape as directed. Compression Wrap Compression Stockings Add-Ons Electronic Signature(s) Signed: 03/06/2022 5:00:34 PM By: Deon Pilling RN, BSN Entered By: Deon Pilling on 03/06/2022 14:56:08 -------------------------------------------------------------------------------- Vitals Details Patient Name: Date of Service: Alonza Bogus. 03/06/2022 2:15  PM Medical Record Number: [id number] Patient Account Number: [id number] Date of Birth/Sex: Treating RN: Mar 20, 1928 (88 y.o. [name], Tammi Klippel Primary Care Suzane Vanderweide: [name] Other Clinician: Referring Susano Cleckler: Treating Nimrat Woolworth/Extender: Theodosia Quay in Treatment: 5 Vital Signs Time Taken: 14:45 Temperature (F): 98.2 Height (in): 62 Pulse (bpm): 65 Weight (lbs): 181 Respiratory Rate (breaths/min): 20 Body Mass Index (BMI): 33.1 Blood Pressure (mmHg): 144/73 Reference Range: 80 - 120 mg / dl Electronic Signature(s) Signed: 03/06/2022 5:00:34 PM By: Deon Pilling RN, BSN Entered By: Deon Pilling on 03/06/2022 14:48:24

## 2022-03-06 NOTE — Progress Notes (Addendum)
[name], [name] ([id number]) Visit Report for 03/06/2022 Chief Complaint Document Details Patient Name: Date of Service: [name] 03/06/2022 2:15 PM Medical Record Number: [id number] Patient Account Number: [id number] Date of Birth/Sex: Treating RN: 1928/02/25 (88 y.o. [name] Primary Care Provider: [name] Other Clinician: Referring Provider: Treating Provider/Extender: Theodosia Quay in Treatment: 5 Information Obtained from: Patient Chief Complaint 01/28/2022; right second toe amputation site dehiscence and bilateral lower extremity wounds. Electronic Signature(s) Signed: 03/06/2022 3:54:07 PM By: Kalman Shan DO Entered By: Kalman Shan on 03/06/2022 15:29:12 -------------------------------------------------------------------------------- Debridement Details Patient Name: Date of Service: [name]. 03/06/2022 2:15 PM Medical Record Number: [id number] Patient Account Number: [id number] Date of Birth/Sex: Treating RN: [birth year] (88 y.o. [name], [name] Primary Care Provider: [name] Other Clinician: Referring Provider: Treating Provider/Extender: Theodosia Quay in Treatment: 5 Debridement Performed for Assessment: Wound #3 Right Amputation Site - Toe Performed By: Physician Kalman Shan, DO Debridement Type: Debridement Level of Consciousness (Pre-procedure): Awake and Alert Pre-procedure Verification/Time Out Yes - 15:10 Taken: Start Time: 15:11 Pain Control: Lidocaine 5% topical ointment T Area Debrided (L x W): otal 0.7 (cm) x 0.3 (cm) = 0.21 (cm) Tissue and other material debrided: Viable, Non-Viable, Slough, Subcutaneous, Tendon, Slough Level: Skin/Subcutaneous Tissue/Muscle Debridement Description: Excisional Instrument: Curette Bleeding: Minimum End Time: 15:16 Procedural Pain: 0 Post Procedural Pain: 0 Response to Treatment: Procedure was tolerated well Level of  Consciousness (Post- Awake and Alert procedure): Post Debridement Measurements of Total Wound Length: (cm) 0.7 Width: (cm) 0.3 Depth: (cm) 0.7 Volume: (cm) 0.115 Character of Wound/Ulcer Post Debridement: Improved Post Procedure Diagnosis Same as Pre-procedure Electronic Signature(s) Signed: 03/06/2022 3:54:07 PM By: Kalman Shan DO Signed: 03/06/2022 5:00:34 PM By: Deon Pilling RN, BSN Entered By: Deon Pilling on 03/06/2022 15:16:56 -------------------------------------------------------------------------------- Debridement Details Patient Name: Date of Service: [name]. 03/06/2022 2:15 PM Medical Record Number: [id number] Patient Account Number: [id number] Date of Birth/Sex: Treating RN: 09/19/1927 (88 y.o. [name], [name] Primary Care Provider: [name] Other Clinician: Referring Provider: Treating Provider/Extender: Theodosia Quay in Treatment: 5 Debridement Performed for Assessment: Wound #5 Right T Great oe Performed By: Physician Kalman Shan, DO Debridement Type: Debridement Level of Consciousness (Pre-procedure): Awake and Alert Pre-procedure Verification/Time Out Yes - 15:10 Taken: Start Time: 15:11 Pain Control: Lidocaine 5% topical ointment T Area Debrided (L x W): otal 0.3 (cm) x 0.4 (cm) = 0.12 (cm) Tissue and other material debrided: Viable, Non-Viable, Slough, Subcutaneous, Slough Level: Skin/Subcutaneous Tissue Debridement Description: Excisional Instrument: Curette Bleeding: Minimum End Time: 15:16 Procedural Pain: 0 Post Procedural Pain: 0 Response to Treatment: Procedure was tolerated well Level of Consciousness (Post- Awake and Alert procedure): Post Debridement Measurements of Total Wound Length: (cm) 0.3 Width: (cm) 0.4 Depth: (cm) 0.2 Volume: (cm) 0.019 Character of Wound/Ulcer Post Debridement: Improved Post Procedure Diagnosis Same as Pre-procedure Electronic Signature(s) Signed:  03/06/2022 3:54:07 PM By: Kalman Shan DO Signed: 03/06/2022 5:00:34 PM By: Deon Pilling RN, BSN Entered By: Deon Pilling on 03/06/2022 15:17:16 -------------------------------------------------------------------------------- HPI Details Patient Name: Date of Service: [name]. 03/06/2022 2:15 PM Medical Record Number: [id number] Patient Account Number: [id number] Date of Birth/Sex: Treating RN: [birth year] (88 y.o. [name] Primary Care Provider: [name] Other Clinician: Referring Provider: Treating Provider/Extender: Theodosia Quay in Treatment: 5 History of Present Illness HPI Description: Admission 01/28/2022 [name] is a 88 year old female with a past medical history of idiopathic peripheral neuropathy  status post amputation to the second right toe secondary to osteomyelitis, COPD and A-fib on Coumadin the presents to the clinic for a 34-month history of nonhealing ulcer to a previous amputation site on the second right toe. She states she has tried Medihoney and silver alginate in the past to this area with little benefit. She also has 2 small areas limited to skin breakdown to her lower extremities bilaterally. She has chronic venous insufficiency but not has not been wearing her compression stockings. She states she bumped her legs against an object and not so the wound started. She has been using Medihoney to the sites. She denies signs of infection. 6/2; patient presents for follow-up. She had an x-ray of her right foot done at last clinic visit and this was negative for evidence of osteomyelitis. She also had a wound culture done that showed extra high levels of Staph aureus. I recommended Keystone antibiotics for this and this was ordered. She had ABIs with TBI's done as well that showed monophasic waveforms to the right foot with TBI of 0 and an ABI of 0.52. Urgent referral was made to vein and vascular and she saw Dr. Durwin Nora  on 6/1, yesterday and he recommended an arteriogram. This is scheduled for 6/16. Patient also reports a new wound to the right great toe. This is a blister that has ruptured. She also reports increased erythema to the toe. 6/6; the patient was worked in urgently today at the insistence of her daughter out of concern for a new wound on the lateral part of the plantar right great toe. She has her original postsurgical wound after the amputation of the right second toe, she has a wound on the medial part of the right great toe. The patient is apparently going for an angiogram by Dr. Durwin Nora in 2 weeks time. 6/13; patient presents for follow-up. She has been using bacitracin to the abrasion on the right great toe. She has been using collagen and Keystone antibiotic to the amputation site. She has no issues or complaints today. She denies signs of infection. 6/22; patient presents for follow-up. She states that her abdominal aortogram was canceled due to her renal function. She has been using Keystone antibiotics to the amputation site and Medihoney to the right great toe wound. At the pace of the right great toe she has a slitlike open area that she thinks was caused by the tape from the dressing. 6/29; patient presents for follow-up. She has been using Keystone antibiotics to the amputation site along with collagen. She has been using Medihoney to the right great toe wound. She has no other wounds. She denies signs of infection. Electronic Signature(s) Signed: 03/06/2022 3:54:07 PM By: Geralyn Corwin DO Entered By: Geralyn Corwin on 03/06/2022 15:30:04 -------------------------------------------------------------------------------- Physical Exam Details Patient Name: Date of Service: [name]. 03/06/2022 2:15 PM Medical Record Number: [id number] Patient Account Number: [id number] Date of Birth/Sex: Treating RN: 1927/09/25 (88 y.o. [name] Primary Care Provider: [name] Other  Clinician: Referring Provider: Treating Provider/Extender: [name] Weeks in Treatment: 5 Constitutional respirations regular, non-labored and within target range for patient.. Cardiovascular 2+ dorsalis pedis/posterior tibialis pulses. Psychiatric pleasant and cooperative. Notes Right foot: T the second digit there is an amputation site that is open with nonviable tissue and granulation tissue. Does not probe to bone. No surrounding o signs of infection. T the lateral aspect of the great toe there is an open wound with nonviable surface And scant granulation  tissue. T the base of the right o o great toe there is epithelization to the previous wound site. Electronic Signature(s) Signed: 03/06/2022 3:54:07 PM By: Kalman Shan DO Entered By: Kalman Shan on 03/06/2022 15:30:49 -------------------------------------------------------------------------------- Physician Orders Details Patient Name: Date of Service: [name]. 03/06/2022 2:15 PM Medical Record Number: [id number] Patient Account Number: [id number] Date of Birth/Sex: Treating RN: 03-Jun-1928 (88 y.o. [name], [name] Primary Care Provider: [name] Other Clinician: Referring Provider: Treating Provider/Extender: Theodosia Quay in Treatment: 5 Verbal / Phone Orders: No Diagnosis Coding ICD-10 Coding Code Description [phone] Non-pressure chronic ulcer of other part of right foot with fat layer exposed L97.822 Non-pressure chronic ulcer of other part of left lower leg with fat layer exposed G90.09 Other idiopathic peripheral autonomic neuropathy M86.9 Osteomyelitis, unspecified I48.91 Unspecified atrial fibrillation Z79.01 Long term (current) use of anticoagulants I89.0 Lymphedema, not elsewhere classified I87.2 Venous insufficiency (chronic) (peripheral) J44.9 Chronic obstructive pulmonary disease, unspecified Follow-up Appointments ppointment in 1  week. - Dr. Heber [place] and [place], Room 8 Thursday 3pm 03/13/2022 Return A ppointment in 2 weeks. - Dr. Heber [place] and [place], Room 8 Thursday 03/20/2022 3pm Return A Other: - **[facility] continue to use when it arrives under the Prisma to right amputation toe site.*** bactracin under the right great toe for as needed protection. Edema Control - Lymphedema / SCD / Other Elevate legs to the level of the heart or above for 30 minutes daily and/or when sitting, a frequency of: - throughout the day. Avoid standing for long periods of time. Home Health New wound care orders this week; continue Home Health for wound care. May utilize formulary equivalent dressing for wound treatment orders unless otherwise specified. - Home health weekly dressing changes. All other days daughter to change. Bacitracin ointment under right great toe as needed. all wounds keystone topical antibiotics and prisma. Other Home Health Orders/Instructions: - [place]. Wound Treatment Wound #3 - Amputation Site - Toe Wound Laterality: Right Cleanser: Soap and Water (Home Health) 1 x Per Day/30 Days Discharge Instructions: May shower and wash wound with dial antibacterial soap and water prior to dressing change. Cleanser: Wound Cleanser (Home Health) 1 x Per Day/30 Days Discharge Instructions: Cleanse the wound with wound cleanser prior to applying a clean dressing using gauze sponges, not tissue or cotton balls. Topical: Compounding T opical Antibiotics (Home Health) 1 x Per Day/30 Days Discharge Instructions: Apply T opical Antibiotics from [facility] daily once it arrives, applies over the [place]. Prim Dressing: Promogran Prisma Matrix, 4.34 (sq in) (silver collagen) (Home Health) 1 x Per Day/30 Days ary Discharge Instructions: Moisten collagen with saline or hydrogel directly to wound bed. apply over the [place] when arrives. Secondary Dressing: Woven Gauze Sponges 2x2 in (Home Health) 1 x Per Day/30  Days Discharge Instructions: Apply over primary dressing. ***Apply a rolled gauze under the right great toe. Secured With: Child psychotherapist, Sterile 2x75 (in/in) (Home Health) 1 x Per Day/30 Days Discharge Instructions: Secure with stretch gauze or use paper tape to secure the gauze. Secured With: Paper Tape, 2x10 (in/yd) (Home Health) 1 x Per Day/30 Days Discharge Instructions: Secure dressing with tape as directed. Wound #5 - T Great oe Wound Laterality: Right Cleanser: Soap and Water (Home Health) 1 x Per Day/30 Days Discharge Instructions: May shower and wash wound with dial antibacterial soap and water prior to dressing change. Cleanser: Wound Cleanser (Home Health) 1 x Per Day/30 Days Discharge Instructions: Cleanse the wound with  wound cleanser prior to applying a clean dressing using gauze sponges, not tissue or cotton balls. Topical: Compounding T opical Antibiotics (Home Health) 1 x Per Day/30 Days Discharge Instructions: Apply T opical Antibiotics from [facility] daily once it arrives, applies over the [place]. Prim Dressing: Promogran Prisma Matrix, 4.34 (sq in) (silver collagen) (Home Health) 1 x Per Day/30 Days ary Discharge Instructions: Moisten collagen with saline or hydrogel directly to wound bed. apply over the [place] when arrives. Secondary Dressing: Woven Gauze Sponges 2x2 in (Home Health) 1 x Per Day/30 Days Discharge Instructions: Apply over primary dressing. may tape down with medipore tape. Secured With: Child psychotherapist, Sterile 2x75 (in/in) (Home Health) 1 x Per Day/30 Days Discharge Instructions: Secure with stretch gauze or use paper tape to secure the gauze. Secured With: Paper Tape, 2x10 (in/yd) (Home Health) 1 x Per Day/30 Days Discharge Instructions: Secure dressing with tape as directed. Electronic Signature(s) Signed: 03/06/2022 3:54:07 PM By: Kalman Shan DO Entered By: Kalman Shan on 03/06/2022  15:30:57 -------------------------------------------------------------------------------- Problem List Details Patient Name: Date of Service: [name]. 03/06/2022 2:15 PM Medical Record Number: [id number] Patient Account Number: [id number] Date of Birth/Sex: Treating RN: 1927/09/09 (88 y.o. [name], Tammi Klippel Primary Care Provider: [name] Other Clinician: Referring Provider: Treating Provider/Extender: Theodosia Quay in Treatment: 5 Active Problems ICD-10 Encounter Code Description Active Date MDM Diagnosis [phone] Non-pressure chronic ulcer of other part of right foot with fat layer exposed 01/28/2022 No Yes L97.822 Non-pressure chronic ulcer of other part of left lower leg with fat layer exposed5/23/2023 No Yes G90.09 Other idiopathic peripheral autonomic neuropathy 01/28/2022 No Yes M86.9 Osteomyelitis, unspecified 01/28/2022 No Yes I48.91 Unspecified atrial fibrillation 01/28/2022 No Yes Z79.01 Long term (current) use of anticoagulants 01/28/2022 No Yes I89.0 Lymphedema, not elsewhere classified 01/28/2022 No Yes I87.2 Venous insufficiency (chronic) (peripheral) 01/28/2022 No Yes J44.9 Chronic obstructive pulmonary disease, unspecified 01/28/2022 No Yes Inactive Problems ICD-10 Code Description Active Date Inactive Date L97.812 Non-pressure chronic ulcer of other part of right lower leg with fat layer exposed 01/28/2022 01/28/2022 Resolved Problems Electronic Signature(s) Signed: 03/06/2022 3:54:07 PM By: Kalman Shan DO Entered By: Kalman Shan on 03/06/2022 15:28:58 -------------------------------------------------------------------------------- Progress Note Details Patient Name: Date of Service: [name]. 03/06/2022 2:15 PM Medical Record Number: [id number] Patient Account Number: [id number] Date of Birth/Sex: Treating RN: [birth year] (88 y.o. [name] Primary Care Provider: [name] Other Clinician: Referring  Provider: Treating Provider/Extender: Theodosia Quay in Treatment: 5 Subjective Chief Complaint Information obtained from Patient 01/28/2022; right second toe amputation site dehiscence and bilateral lower extremity wounds. History of Present Illness (HPI) Admission 01/28/2022 Ms. Leverta Schrader is a 88 year old female with a past medical history of idiopathic peripheral neuropathy status post amputation to the second right toe secondary to osteomyelitis, COPD and A-fib on Coumadin the presents to the clinic for a 52-month history of nonhealing ulcer to a previous amputation site on the second right toe. She states she has tried Medihoney and silver alginate in the past to this area with little benefit. She also has 2 small areas limited to skin breakdown to her lower extremities bilaterally. She has chronic venous insufficiency but not has not been wearing her compression stockings. She states she bumped her legs against an object and not so the wound started. She has been using Medihoney to the sites. She denies signs of infection. 6/2; patient presents for follow-up. She had an x-ray of her right foot done  at last clinic visit and this was negative for evidence of osteomyelitis. She also had a wound culture done that showed extra high levels of Staph aureus. I recommended Keystone antibiotics for this and this was ordered. She had ABIs with TBI's done as well that showed monophasic waveforms to the right foot with TBI of 0 and an ABI of 0.52. Urgent referral was made to vein and vascular and she saw Dr. Doren Custard on 6/1, yesterday and he recommended an arteriogram. This is scheduled for 6/16. Patient also reports a new wound to the right great toe. This is a blister that has ruptured. She also reports increased erythema to the toe. 6/6; the patient was worked in urgently today at the insistence of her daughter out of concern for a new wound on the lateral part of the plantar  right great toe. She has her original postsurgical wound after the amputation of the right second toe, she has a wound on the medial part of the right great toe. The patient is apparently going for an angiogram by Dr. Doren Custard in 2 weeks time. 6/13; patient presents for follow-up. She has been using bacitracin to the abrasion on the right great toe. She has been using collagen and Keystone antibiotic to the amputation site. She has no issues or complaints today. She denies signs of infection. 6/22; patient presents for follow-up. She states that her abdominal aortogram was canceled due to her renal function. She has been using Keystone antibiotics to the amputation site and Medihoney to the right great toe wound. At the pace of the right great toe she has a slitlike open area that she thinks was caused by the tape from the dressing. 6/29; patient presents for follow-up. She has been using Keystone antibiotics to the amputation site along with collagen. She has been using Medihoney to the right great toe wound. She has no other wounds. She denies signs of infection. Patient History Information obtained from Patient, Caregiver. Family History Cancer - Siblings, Heart Disease, Stroke - Mother, No family history of Diabetes. Social History Former smoker - quit 2003, Marital Status - Widowed, Alcohol Use - Never, Drug Use - No History, Caffeine Use - Never. Medical History Eyes Patient has history of Cataracts Hematologic/Lymphatic Patient has history of Anemia Respiratory Denies history of Chronic Obstructive Pulmonary Disease (COPD) Cardiovascular Patient has history of Congestive Heart Failure, Hypotension, Peripheral Venous Disease Musculoskeletal Patient has history of 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 Neurologic Patient has history of Neuropathy Medical A Surgical History Notes nd Hematologic/Lymphatic Thrombocytopenia Endocrine Hyperthyroidism,  Hypothyroidism Objective Constitutional respirations regular, non-labored and within target range for patient.. Vitals Time Taken: 2:45 PM, Height: 62 in, Weight: 181 lbs, BMI: 33.1, Temperature: 98.2 F, Pulse: 65 bpm, Respiratory Rate: 20 breaths/min, Blood Pressure: 144/73 mmHg. Cardiovascular 2+ dorsalis pedis/posterior tibialis pulses. Psychiatric pleasant and cooperative. General Notes: Right foot: T the second digit there is an amputation site that is open with nonviable tissue and granulation tissue. Does not probe to bone. No o surrounding signs of infection. T the lateral aspect of the great toe there is an open wound with nonviable surface And scant granulation tissue. T the base of o o the right great toe there is epithelization to the previous wound site. Integumentary (Hair, Skin) Wound #3 status is Open. Original cause of wound was Surgical Injury. The date acquired was: 11/17/2021. The wound has been in treatment 5 weeks. The wound is located on the Right Amputation Site - T The  wound measures 0.7cm length x 0.3cm width x 0.7cm depth; 0.165cm^2 area and 0.115cm^3 volume. oe. There is Fat Layer (Subcutaneous Tissue) exposed. There is tunneling at 12:00 with a maximum distance of 0.5cm. There is a medium amount of serosanguineous drainage noted. The wound margin is epibole. There is large (67-100%) red granulation within the wound bed. There is no necrotic tissue within the wound bed. Wound #5 status is Open. Original cause of wound was Shear/Friction. The date acquired was: 02/06/2022. The wound has been in treatment 3 weeks. The wound is located on the Right T Great. The wound measures 0.3cm length x 0.4cm width x 0.2cm depth; 0.094cm^2 area and 0.019cm^3 volume. There is Fat Layer oe (Subcutaneous Tissue) exposed. There is no tunneling or undermining noted. There is a medium amount of serosanguineous drainage noted. The wound margin is distinct with the outline attached to  the wound base. There is medium (34-66%) pink, pale granulation within the wound bed. There is no necrotic tissue within the wound bed. Assessment Active Problems ICD-10 Non-pressure chronic ulcer of other part of right foot with fat layer exposed Non-pressure chronic ulcer of other part of left lower leg with fat layer exposed Other idiopathic peripheral autonomic neuropathy Osteomyelitis, unspecified Unspecified atrial fibrillation Long term (current) use of anticoagulants Lymphedema, not elsewhere classified Venous insufficiency (chronic) (peripheral) Chronic obstructive pulmonary disease, unspecified Patient's wounds are stable. I debrided nonviable tissue. At this time I recommended Keystone and collagen to both wounds. Unfortunately patient has inadequate blood flow for wound healing on the right lower extremity. She cannot obtain an aortogram due to her kidney function. She is following up with nephrology and vein and vascular for the next steps. Procedures Wound #3 Pre-procedure diagnosis of Wound #3 is an Open Surgical Wound located on the Right Amputation Site - T . There was a Excisional Skin/Subcutaneous oe Tissue/Muscle Debridement with a total area of 0.21 sq cm performed by Geralyn Corwin, DO. With the following instrument(s): Curette to remove Viable and Non-Viable tissue/material. Material removed includes T endon, Subcutaneous Tissue, and Slough after achieving pain control using Lidocaine 5% topical ointment. A time out was conducted at 15:10, prior to the start of the procedure. A Minimum amount of bleeding was controlled with N/A. The procedure was tolerated well with a pain level of 0 throughout and a pain level of 0 following the procedure. Post Debridement Measurements: 0.7cm length x 0.3cm width x 0.7cm depth; 0.115cm^3 volume. Character of Wound/Ulcer Post Debridement is improved. Post procedure Diagnosis Wound #3: Same as Pre-Procedure Wound #5 Pre-procedure  diagnosis of Wound #5 is an Abrasion located on the Right T Great . There was a Excisional Skin/Subcutaneous Tissue Debridement with a oe total area of 0.12 sq cm performed by Geralyn Corwin, DO. With the following instrument(s): Curette to remove Viable and Non-Viable tissue/material. Material removed includes Subcutaneous Tissue and Slough and after achieving pain control using Lidocaine 5% topical ointment. A time out was conducted at 15:10, prior to the start of the procedure. A Minimum amount of bleeding was controlled with N/A. The procedure was tolerated well with a pain level of 0 throughout and a pain level of 0 following the procedure. Post Debridement Measurements: 0.3cm length x 0.4cm width x 0.2cm depth; 0.019cm^3 volume. Character of Wound/Ulcer Post Debridement is improved. Post procedure Diagnosis Wound #5: Same as Pre-Procedure Plan Follow-up Appointments: Return Appointment in 1 week. - Dr. Mikey Bussing and [place], Room 8 Thursday 3pm 03/13/2022 Return Appointment in 2 weeks. - Dr. Mikey Bussing  and Tammi Klippel, Room 8 Thursday 03/20/2022 3pm Other: - [facility] continue to use when it arrives under the Prisma to right amputation toe site.*** bactracin under the right great toe for as needed protection. Edema Control - Lymphedema / SCD / Other: Elevate legs to the level of the heart or above for 30 minutes daily and/or when sitting, a frequency of: - throughout the day. Avoid standing for long periods of time. Home Health: New wound care orders this week; continue Home Health for wound care. May utilize formulary equivalent dressing for wound treatment orders unless otherwise specified. - Home health weekly dressing changes. All other days daughter to change. Bacitracin ointment under right great toe as needed. all wounds keystone topical antibiotics and prisma. Other Home Health Orders/Instructions: - [place]. WOUND #3: - Amputation Site - T oe Wound Laterality:  Right Cleanser: Soap and Water (Home Health) 1 x Per Day/30 Days Discharge Instructions: May shower and wash wound with dial antibacterial soap and water prior to dressing change. Cleanser: Wound Cleanser (Home Health) 1 x Per Day/30 Days Discharge Instructions: Cleanse the wound with wound cleanser prior to applying a clean dressing using gauze sponges, not tissue or cotton balls. Topical: Compounding T opical Antibiotics (Home Health) 1 x Per Day/30 Days Discharge Instructions: Apply T opical Antibiotics from [facility] daily once it arrives, applies over the [place]. Prim Dressing: Promogran Prisma Matrix, 4.34 (sq in) (silver collagen) (Home Health) 1 x Per Day/30 Days ary Discharge Instructions: Moisten collagen with saline or hydrogel directly to wound bed. apply over the [place] when arrives. Secondary Dressing: Woven Gauze Sponges 2x2 in (Home Health) 1 x Per Day/30 Days Discharge Instructions: Apply over primary dressing. ***Apply a rolled gauze under the right great toe. Secured With: Child psychotherapist, Sterile 2x75 (in/in) (Home Health) 1 x Per Day/30 Days Discharge Instructions: Secure with stretch gauze or use paper tape to secure the gauze. Secured With: Paper T ape, 2x10 (in/yd) ([place]) 1 x Per Day/30 Days Discharge Instructions: Secure dressing with tape as directed. WOUND #5: - T Great Wound Laterality: Right oe Cleanser: Soap and Water (Home Health) 1 x Per Day/30 Days Discharge Instructions: May shower and wash wound with dial antibacterial soap and water prior to dressing change. Cleanser: Wound Cleanser (Home Health) 1 x Per Day/30 Days Discharge Instructions: Cleanse the wound with wound cleanser prior to applying a clean dressing using gauze sponges, not tissue or cotton balls. Topical: Compounding T opical Antibiotics (Home Health) 1 x Per Day/30 Days Discharge Instructions: Apply T opical Antibiotics from [facility] daily once it  arrives, applies over the [place]. Prim Dressing: Promogran Prisma Matrix, 4.34 (sq in) (silver collagen) (Home Health) 1 x Per Day/30 Days ary Discharge Instructions: Moisten collagen with saline or hydrogel directly to wound bed. apply over the [place] when arrives. Secondary Dressing: Woven Gauze Sponges 2x2 in (Home Health) 1 x Per Day/30 Days Discharge Instructions: Apply over primary dressing. may tape down with medipore tape. Secured With: Child psychotherapist, Sterile 2x75 (in/in) (Home Health) 1 x Per Day/30 Days Discharge Instructions: Secure with stretch gauze or use paper tape to secure the gauze. Secured With: Paper T ape, 2x10 (in/yd) ([place]) 1 x Per Day/30 Days Discharge Instructions: Secure dressing with tape as directed. 1. In office sharp debridement 2. Keystone antibiotic and collagen 3. Follow-up in 1 week Electronic Signature(s) Signed: 03/06/2022 3:54:07 PM By: Kalman Shan DO Signed: 03/06/2022 3:54:07 PM By: Kalman Shan DO Entered  By: Kalman Shan on 03/06/2022 15:35:20 -------------------------------------------------------------------------------- HxROS Details Patient Name: Date of Service: [name], [name]. 03/06/2022 2:15 PM Medical Record Number: [id number] Patient Account Number: [id number] Date of Birth/Sex: Treating RN: 07/10/1928 (88 y.o. [name] Primary Care Provider: [name] Other Clinician: Referring Provider: Treating Provider/Extender: Theodosia Quay in Treatment: 5 Information Obtained From Patient Caregiver Eyes Medical History: Positive for: Cataracts Hematologic/Lymphatic Medical History: Positive for: Anemia Past Medical History Notes: Thrombocytopenia Respiratory Medical History: Negative for: Chronic Obstructive Pulmonary Disease (COPD) Cardiovascular Medical History: Positive for: Congestive Heart Failure; Hypotension; Peripheral Venous  Disease Endocrine Medical History: Past Medical History Notes: Hyperthyroidism, Hypothyroidism Musculoskeletal Medical History: Positive for: 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 Neurologic Medical History: Positive for: Neuropathy HBO Extended History Items Eyes: Cataracts Immunizations Pneumococcal Vaccine: Received Pneumococcal Vaccination: No Implantable Devices None Family and Social History Cancer: Yes - Siblings; Diabetes: No; Heart Disease: Yes; Stroke: Yes - Mother; Former smoker - quit 2003; Marital Status - Widowed; Alcohol Use: Never; Drug Use: No History; Caffeine Use: Never; Financial Concerns: No; Food, Clothing or Shelter Needs: No; Support System Lacking: No; Transportation Concerns: No Electronic Signature(s) Signed: 03/06/2022 3:54:07 PM By: Kalman Shan DO Signed: 03/06/2022 5:00:34 PM By: Deon Pilling RN, BSN Entered By: Kalman Shan on 03/06/2022 15:30:10 -------------------------------------------------------------------------------- SuperBill Details Patient Name: Date of Service: [name]. 03/06/2022 Medical Record Number: [id number] Patient Account Number: [id number] Date of Birth/Sex: Treating RN: Nov 04, 1927 (88 y.o. [name], [name] Primary Care Provider: [name] Other Clinician: Referring Provider: Treating Provider/Extender: Theodosia Quay in Treatment: 5 Diagnosis Coding ICD-10 Codes Code Description [phone] Non-pressure chronic ulcer of other part of right foot with fat layer exposed L97.822 Non-pressure chronic ulcer of other part of left lower leg with fat layer exposed G90.09 Other idiopathic peripheral autonomic neuropathy M86.9 Osteomyelitis, unspecified I48.91 Unspecified atrial fibrillation Z79.01 Long term (current) use of anticoagulants I89.0 Lymphedema, not elsewhere classified I87.2 Venous insufficiency (chronic) (peripheral) J44.9 Chronic obstructive pulmonary disease,  unspecified Facility Procedures CPT4 Code: [id number] Description: [id number] - DEB SUBQ TISSUE 20 SQ CM/< ICD-10 Diagnosis Description L97.512 Non-pressure chronic ulcer of other part of right foot with fat layer exposed Modifier: Quantity: 1 CPT4 Code: [id number] Description: [place] - DEB MUSC/FASCIA 20 SQ CM/< ICD-10 Diagnosis Description L97.512 Non-pressure chronic ulcer of other part of right foot with fat layer exposed Modifier: 59 Quantity: 1 Physician Procedures : CPT4 Code Description Modifier [id number] - WC PHYS SUBQ TISS 20 SQ CM ICD-10 Diagnosis Description L97.512 Non-pressure chronic ulcer of other part of right foot with fat layer exposed Quantity: 1 : [id number] - WC PHYS DEBR MUSCLE/FASCIA 20 SQ CM 59 ICD-10 Diagnosis Description L97.512 Non-pressure chronic ulcer of other part of right foot with fat layer exposed Quantity: 1 Electronic Signature(s) Signed: 03/06/2022 3:54:07 PM By: Kalman Shan DO Entered By: Kalman Shan on 03/06/2022 15:35:32

## 2022-03-13 ENCOUNTER — Ambulatory Visit (HOSPITAL_BASED_OUTPATIENT_CLINIC_OR_DEPARTMENT_OTHER): Payer: Medicare HMO | Admitting: Internal Medicine

## 2022-03-14 ENCOUNTER — Telehealth: Payer: Self-pay

## 2022-03-14 NOTE — Telephone Encounter (Signed)
Pt's daughter, [name], called stating that the pt had a surgery scheduled that was canceled d/t kidney function. She was frustrated because she wasn't getting an appt with [facility] and the wound center was worried that the pt may lose her foot if not seen soon.  Reviewed pt's chart, returned call, two identifiers used. She had finally gotten an appt with [facility] for 8/30. Instructed her to reach out to them and see if she could get an earlier appt if someone canceled. She will then call this office for an appt to see Dr Edilia Bo before rescheduling surgery. Confirmed understanding.

## 2022-03-17 ENCOUNTER — Encounter (HOSPITAL_BASED_OUTPATIENT_CLINIC_OR_DEPARTMENT_OTHER): Payer: Self-pay | Admitting: Cardiology

## 2022-03-19 NOTE — Progress Notes (Addendum)
[name], [name] ([id number]) Visit Report for 01/28/2022 Chief Complaint Document Details Patient Name: Date of Service: [name], BLOWER. 01/28/2022 9:00 A M Medical Record Number: [id number] Patient Account Number: [id number] Date of Birth/Sex: Treating RN: [birth year]egan (88 y.o. F) Primary Care Provider: Mila [name] Other Clinician: Referring Provider: Treating Provider/Extender: [name] in Treatment: 0 Information Obtained from: Patient Chief Complaint 01/28/2022; right second toe amputation site dehiscence and bilateral lower extremity wounds. Electronic Signature(s) Signed: 01/28/2022 12:39:58 PM By: Megan Corwin DO Entered By: [name] on 01/28/2022 12:24:14 -------------------------------------------------------------------------------- Debridement Details Patient Name: Date of Service: [name]. 01/28/2022 9:00 A M Medical Record Number: [id number] Patient Account Number: [id number] Date of Birth/Sex: Treating RN: [name], [name] Primary Care Provider: Mila [name] Other Clinician: Referring Provider: Treating Provider/Extender: [name] in Treatment: 0 Debridement Performed for Assessment: Wound #2 Right,Lateral Lower Leg Performed By: Clinician Megan Stall, RN Debridement Type: Chemical/Enzymatic/Mechanical Agent Used: gauze and wound cleanser Level of Consciousness (Pre-procedure): Awake and Alert Pre-procedure Verification/Time Out No Taken: Bleeding: None Response to Treatment: Procedure was tolerated well Level of Consciousness (Post- Awake and Alert procedure): Post Debridement Measurements of Total Wound Length: (cm) 0.4 Width: (cm) 0.5 Depth: (cm) 0.1 Volume: (cm) 0.016 Character of Wound/Ulcer Post Debridement: Stable Post Procedure Diagnosis Same as Pre-procedure Electronic Signature(s) Signed: 01/28/2022 12:39:58 PM By: Megan Corwin DO Signed:  01/28/2022 4:34:51 PM By: Megan Stall RN, BSN Entered By: [name] on 01/28/2022 10:26:37 -------------------------------------------------------------------------------- Debridement Details Patient Name: Date of Service: [name]. 01/28/2022 9:00 A M Medical Record Number: [id number] Patient Account Number: [id number] Date of Birth/Sex: Treating RN: [name], [name] Primary Care Provider: Mila [name] Other Clinician: Referring Provider: Treating Provider/Extender: [name] in Treatment: 0 Debridement Performed for Assessment: Wound #4 Left,Anterior Lower Leg Performed By: Clinician Megan Stall, RN Debridement Type: Chemical/Enzymatic/Mechanical Agent Used: gauze and wound cleanser Level of Consciousness (Pre-procedure): Awake and Alert Pre-procedure Verification/Time Out No Taken: Bleeding: None Response to Treatment: Procedure was tolerated well Level of Consciousness (Post- Awake and Alert procedure): Post Debridement Measurements of Total Wound Length: (cm) 0.5 Width: (cm) 0.7 Depth: (cm) 0.1 Volume: (cm) 0.027 Character of Wound/Ulcer Post Debridement: Stable Post Procedure Diagnosis Same as Pre-procedure Electronic Signature(s) Signed: 01/28/2022 12:39:58 PM By: Megan Corwin DO Signed: 01/28/2022 4:34:51 PM By: Megan Stall RN, BSN Entered By: [name] on 01/28/2022 10:27:19 -------------------------------------------------------------------------------- Debridement Details Patient Name: Date of Service: [name]. 01/28/2022 9:00 A M Medical Record Number: [id number] Patient Account Number: [id number] Date of Birth/Sex: Treating RN: [name], [name], [name] Primary Care Provider: Mila [name] Other Clinician: Referring Provider: Treating Provider/Extender: [name] in Treatment: 0 Debridement Performed for Assessment: Wound #3 Right  Amputation Site - Toe Performed By: Physician Megan Corwin, DO Debridement Type: Debridement Level of Consciousness (Pre-procedure): Awake and Alert Pre-procedure Verification/Time Out Yes - 10:20 Taken: Start Time: 10:21 Pain Control: Other : benzocaine 20% T Area Debrided (L x W): otal 1.1 (cm) x 0.5 (cm) = 0.55 (cm) Tissue and other material debrided: Viable, Non-Viable, Slough, Subcutaneous, Slough Level: Skin/Subcutaneous Tissue Debridement Description: Excisional Instrument: Curette, N/A Bleeding: None Response to Treatment: Procedure was tolerated well Level of Consciousness (Post- Awake and Alert procedure): Post Debridement Measurements of Total Wound Length: (cm) 1.1 Width: (cm) 0.5 Depth: (cm) 0.5 Volume: (cm) 0.216 Character of Wound/Ulcer Post Debridement: Improved Post Procedure Diagnosis Same  as Architectural technologist) Signed: 01/28/2022 12:39:58 PM By: Megan Corwin DO Signed: 01/28/2022 4:34:51 PM By: Megan Stall RN, BSN Entered By: [name] on 01/28/2022 10:28:54 -------------------------------------------------------------------------------- HPI Details Patient Name: Date of Service: [name] TTIE M. 01/28/2022 9:00 A M Medical Record Number: [id number] Patient Account Number: [id number] Date of Birth/Sex: Treating RN: [birth year]Megan (88 y.o. F) Primary Care Provider: Mila [name] Other Clinician: Referring Provider: Treating Provider/Extender: [name] in Treatment: 0 History of Present Illness HPI Description: Admission 01/28/2022 [name] is a 88 year old female with a past medical history of idiopathic peripheral neuropathy status post amputation to the second right toe secondary to osteomyelitis, COPD and A-fib on Coumadin the presents to the clinic for a 60-month history of nonhealing ulcer to a previous amputation site on the second right toe. She states she has tried Medihoney and  silver alginate in the past to this area with little benefit. She also has 2 small areas limited to skin breakdown to her lower extremities bilaterally. She has chronic venous insufficiency but not has not been wearing her compression stockings. She states she bumped her legs against an object and not so the wound started. She has been using Medihoney to the sites. She denies signs of infection. Electronic Signature(s) Signed: 01/28/2022 12:39:58 PM By: Megan Corwin DO Entered By: [name] on 01/28/2022 12:27:53 -------------------------------------------------------------------------------- Physical Exam Details Patient Name: Date of Service: Megan Rings M. 01/28/2022 9:00 A M Medical Record Number: [id number] Patient Account Number: [id number] Date of Birth/Sex: Treating RN: 10[birth year]/[name] (88 y.o. F) Primary Care Provider: Mila [name] Other Clinician: Referring Provider: Treating Provider/Extender: [name] in Treatment: 0 Constitutional respirations regular, non-labored and within target range for patient.[name] Kitchen Psychiatric pleasant and cooperative. Notes Right foot: T the second digit there is an amputation site that is open with nonviable tissue and scant granulation tissue. Does not probe to bone. No o surrounding signs of infection. Bilateral lower extremity wounds limited to skin breakdown. No weeping noted. Venous stasis dermatitis bilaterally Faint pedal pulses Electronic Signature(s) Signed: 01/28/2022 12:39:58 PM By: Megan Corwin DO Entered By: [name] on 01/28/2022 12:28:51 -------------------------------------------------------------------------------- Physician Orders Details Patient Name: Date of Service: [name]. 01/28/2022 9:00 A M Medical Record Number: [id number] Patient Account Number: [id number] Date of Birth/Sex: Treating RN: [birth year][name] (88 y.o. [name], [name] Primary Care Provider: Mila [name] Other Clinician: Referring Provider: Treating Provider/Extender: [name] in Treatment: 0 Verbal / Phone Orders: No Diagnosis Coding ICD-10 Coding Code Description [phone] Non-pressure chronic ulcer of other part of right foot with fat layer exposed L97.812 Non-pressure chronic ulcer of other part of right lower leg with fat layer exposed L97.822 Non-pressure chronic ulcer of other part of left lower leg with fat layer exposed G90.09 Other idiopathic peripheral autonomic neuropathy M86.9 Osteomyelitis, unspecified I48.91 Unspecified atrial fibrillation Z79.01 Long term (current) use of anticoagulants I89.0 Lymphedema, not elsewhere classified Follow-up Appointments ppointment in 1 week. - Dr. Mikey Bussing and [place], Room 8 02/07/2022 0945 Friday Return A Dr. Mikey Bussing and [place], Room 8 6/8/223 215pm Thursday Other: - ***Pick up at your pharmacy [facility].*** ***Pick up bacitracin ointment for the leg wounds.** ***Once culture is back and if positive will order from [facility]. They will call you to ship the topical antibiotics.*** Edema Control - Lymphedema / SCD / Other Elevate legs to the level of the heart or above for 30 minutes daily and/or when sitting, a frequency of: - throughout  the day. Avoid standing for long periods of time. Home Health New wound care orders this week; continue Home Health for wound care. Megan utilize formulary equivalent dressing for wound treatment orders unless otherwise specified. - Home health weekly dressing changes. All other days daughter to change. Other Home Health Orders/Instructions: - [facility]. Wound Treatment Wound #2 - Lower Leg Wound Laterality: Right, Lateral Cleanser: Soap and Water (Home Health) 1 x Per Day/30 Days Discharge Instructions: Megan shower and wash wound with dial antibacterial soap and water prior to dressing change. Cleanser: Wound Cleanser (Home Health) 1 x Per Day/30  Days Discharge Instructions: Cleanse the wound with wound cleanser prior to applying a clean dressing using gauze sponges, not tissue or cotton balls. Topical: bacitracin ointment (Home Health) 1 x Per Day/30 Days Discharge Instructions: Apply directly to wound bed. Secondary Dressing: Zetuvit Plus Silicone Border Dressing 4x4 (in/in) (Home Health) 1 x Per Day/30 Days Discharge Instructions: Apply silicone border or bandaids. Wound #3 - Amputation Site - Toe Wound Laterality: Right Cleanser: Soap and Water (Home Health) 1 x Per Day/30 Days Discharge Instructions: Megan shower and wash wound with dial antibacterial soap and water prior to dressing change. Cleanser: Wound Cleanser (Home Health) 1 x Per Day/30 Days Discharge Instructions: Cleanse the wound with wound cleanser prior to applying a clean dressing using gauze sponges, not tissue or cotton balls. Prim Dressing: [facility] Solution 0.25%, 16 (oz) (Home Health) 1 x Per Day/30 Days ary Discharge Instructions: Moisten gauze with [facility]in's solution Secondary Dressing: Woven Gauze Sponge, Non-Sterile 4x4 in (Home Health) 1 x Per Day/30 Days Discharge Instructions: Apply over primary dressing as directed. Secured With: Insurance underwriter, Sterile 2x75 (in/in) (Home Health) 1 x Per Day/30 Days Discharge Instructions: Secure with stretch gauze as directed. Secured With: Paper Tape, 2x10 (in/yd) (Home Health) 1 x Per Day/30 Days Discharge Instructions: Secure dressing with tape as directed. Wound #4 - Lower Leg Wound Laterality: Left, Anterior Cleanser: Soap and Water (Home Health) 1 x Per Day/30 Days Discharge Instructions: Megan shower and wash wound with dial antibacterial soap and water prior to dressing change. Cleanser: Wound Cleanser (Home Health) 1 x Per Day/30 Days Discharge Instructions: Cleanse the wound with wound cleanser prior to applying a clean dressing using gauze sponges, not tissue or cotton balls. Topical: bacitracin  ointment (Home Health) 1 x Per Day/30 Days Discharge Instructions: Apply directly to wound bed. Secondary Dressing: Zetuvit Plus Silicone Border Dressing 4x4 (in/in) (Home Health) 1 x Per Day/30 Days Discharge Instructions: Apply silicone border or bandaids. Laboratory erobe culture (MICRO) - PCR culture of right foot. Once the culture returns will call you if Bacteria identified in Unspecified specimen by A positive. - (ICD10 L97.512 - Non-pressure chronic ulcer of other part of right foot with fat layer exposed) LOINC Code: 634-6 Convenience Name: Aerobic culture-specimen not specified Radiology X-ray, foot right - x-ray of right foot related to nonhealing surgical foot wound looking for infection. - (ICD10 L97.512 - Non-pressure chronic ulcer of other part of right foot with fat layer exposed) Services and Therapies rterial Studies- Bilateral - Vein and Vascular arterial studies to Bilateral lower legs. A Patient Medications llergies: penicillin, erythromycin base, green dyes, iodine, sulfasalazine, tramadol, oxycodone, codeine A Notifications Medication Indication Start End 01/28/2022 [facility] Solution DOSE 1 - miscellaneous 0.25 % solution - apply to gauze for wet to dry dressings Electronic Signature(s) Signed: 01/28/2022 12:39:58 PM By: Megan Corwin DO Previous Signature: 01/28/2022 11:05:44 AM Version By: Megan Corwin DO Entered  By: Megan CorwinHoffman, Brittaney Beaulieu on 01/28/2022 12:30:14 Prescription 01/28/2022 -------------------------------------------------------------------------------- Lefkowitz, Neysha M. Megan CorwinHoffman, Maribel Luis DO Patient Name: Provider: [birth year]Megan [id number] Date of Birth: NPI#: [id number] Sex: DEA #: [id number] Phone #: License #[facility] Patient Address: [address] [place], [place] [address] [address] [place], [place] [address] [phone] Allergies penicillin; erythromycin base; green  dyes; iodine; sulfasalazine; tramadol; oxycodone; codeine Provider's Orders X-ray, foot right - ICD10: L97.512 - x-ray of right foot related to nonhealing surgical foot wound looking for infection. Hand Signature: Date(s): Prescription 01/28/2022 Peter, Raylan M. Megan CorwinHoffman, Jeyli Zwicker DO Patient Name: Provider: [birth year]Megan [id number] Date of Birth: NPI#: [id number] Sex: DEA #: [id number] Phone #: License #: [facility] Patient Address: [address] [place], [place] [address] [address] Floor [place], [place] [address] [phone] Allergies penicillin; erythromycin base; green dyes; iodine; sulfasalazine; tramadol; oxycodone; codeine Provider's Orders rterial Studies- Bilateral - Vein and Vascular arterial studies to Bilateral lower legs. A Hand Signature: Date(s): Electronic Signature(s) Signed: 01/28/2022 12:39:58 PM By: Megan CorwinHoffman, Maurissa Ambrose DO Entered By: Megan CorwinHoffman, Admir Candelas on 01/28/2022 12:30:14 -------------------------------------------------------------------------------- Problem List Details Patient Name: Date of Service: Megan ReapHILL, [name] 01/28/2022 9:00 A M Medical Record Number: [id number] Patient Account Number: [id number] Date of Birth/Sex: Treating RN: [birth year]/Megan (88 y.o. F) Primary Care Provider: Mila PalmerWolters, Sharon Other Clinician: Referring Provider: Treating Provider/Extender: Megan RiisHoffman, Primrose Oler Wolters, Sharon Weeks in Treatment: 0 Active Problems ICD-10 Encounter Code Description Active Date MDM Diagnosis L97.512 Non-pressure chronic ulcer of other part of right foot with fat layer exposed 01/28/2022 No Yes L97.812 Non-pressure chronic ulcer of other part of right lower leg with fat layer 01/28/2022 No Yes exposed L97.822 Non-pressure chronic ulcer of other part of left lower leg with fat layer exposed5/23/2023 No Yes G90.09 Other idiopathic peripheral autonomic neuropathy 01/28/2022 No Yes M86.9  Osteomyelitis, unspecified 01/28/2022 No Yes I48.91 Unspecified atrial fibrillation 01/28/2022 No Yes Z79.01 Long term (current) use of anticoagulants 01/28/2022 No Yes I89.0 Lymphedema, not elsewhere classified 01/28/2022 No Yes I87.2 Venous insufficiency (chronic) (peripheral) 01/28/2022 No Yes J44.9 Chronic obstructive pulmonary disease, unspecified 01/28/2022 No Yes Inactive Problems Resolved Problems Electronic Signature(s) Signed: 01/28/2022 12:39:58 PM By: Megan CorwinHoffman, Lateesha Bezold DO Entered By: Megan CorwinHoffman, Ericberto Padget on 01/28/2022 12:38:48 -------------------------------------------------------------------------------- Progress Note Details Patient Name: Date of Service: Megan ReapHILL, [name] 01/28/2022 9:00 A M Medical Record Number: [id number] Patient Account Number: [id number] Date of Birth/Sex: Treating RN: [birth year]/Megan (88 y.o. F) Primary Care Provider: Mila PalmerWolters, Sharon Other Clinician: Referring Provider: Treating Provider/Extender: Megan RiisHoffman, Sirius Woodford Wolters, Sharon Weeks in Treatment: 0 Subjective Chief Complaint Information obtained from Patient 01/28/2022; right second toe amputation site dehiscence and bilateral lower extremity wounds. History of Present Illness (HPI) Admission 01/28/2022 Ms. Fredonia HighlandHattie Comins is a 88 year old female with a past medical history of idiopathic peripheral neuropathy status post amputation to the second right toe secondary to osteomyelitis, COPD and A-fib on Coumadin the presents to the clinic for a 1447-month history of nonhealing ulcer to a previous amputation site on the second right toe. She states she has tried Medihoney and silver alginate in the past to this area with little benefit. She also has 2 small areas limited to skin breakdown to her lower extremities bilaterally. She has chronic venous insufficiency but not has not been wearing her compression stockings. She states she bumped her legs against an object and not so the wound started. She has been using Medihoney to  the sites. She  denies signs of infection. Patient History Information obtained from Patient, Caregiver. Allergies penicillin, erythromycin base, green dyes, iodine, sulfasalazine, tramadol, oxycodone, codeine Family History Cancer - Siblings, Heart Disease, Stroke - Mother, No family history of Diabetes. Social History Former smoker - quit 2003, Marital Status - Widowed, Alcohol Use - Never, Drug Use - No History, Caffeine Use - Never. Medical History Eyes Patient has history of Cataracts Hematologic/Lymphatic Patient has history of Anemia Respiratory Denies history of Chronic Obstructive Pulmonary Disease (COPD) Cardiovascular Patient has history of Congestive Heart Failure, Hypotension, Peripheral Venous Disease Musculoskeletal Patient has history of 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 Neurologic Patient has history of Neuropathy Medical A Surgical History Notes nd Hematologic/Lymphatic Thrombocytopenia Endocrine Hyperthyroidism, Hypothyroidism Review of Systems (ROS) Eyes Complains or has symptoms of Glasses / Contacts. Ear/Nose/Mouth/Throat Denies complaints or symptoms of Chronic sinus problems or rhinitis. Cardiovascular Afib Gastrointestinal GERD Integumentary (Skin) Complains or has symptoms of Wounds - BLE. Psychiatric Denies complaints or symptoms of Claustrophobia, Suicidal. Objective Constitutional respirations regular, non-labored and within target range for patient.. Vitals Time Taken: 9:[name] AM, Height: 62 in, Source: Stated, Weight: 181 lbs, Source: Stated, BMI: 33.1, Temperature: 98.1 F, Pulse: 61 bpm, Respiratory Rate: 18 breaths/min, Blood Pressure: 112/52 mmHg. Psychiatric pleasant and cooperative. General Notes: Right foot: T the second digit there is an amputation site that is open with nonviable tissue and scant granulation tissue. Does not probe to o bone. No surrounding signs of infection. Bilateral lower extremity wounds limited  to skin breakdown. No weeping noted. Venous stasis dermatitis bilaterally Faint pedal pulses Integumentary (Hair, Skin) Wound #2 status is Open. Original cause of wound was Bump. The date acquired was: 01/14/2022. The wound is located on the Right,Lateral Lower Leg. The wound measures 0.4cm length x 0.5cm width x 0.1cm depth; 0.157cm^2 area and 0.016cm^3 volume. There is Fat Layer (Subcutaneous Tissue) exposed. There is no tunneling or undermining noted. There is a medium amount of serosanguineous drainage noted. The wound margin is distinct with the outline attached to the wound base. There is large (67-100%) pink, pale granulation within the wound bed. There is no necrotic tissue within the wound bed. Wound #3 status is Open. Original cause of wound was Surgical Injury. The date acquired was: 11/17/2021. The wound is located on the Right Amputation Site - T The wound measures 1.1cm length x 0.5cm width x 0.5cm depth; 0.432cm^2 area and 0.216cm^3 volume. There is Fat Layer (Subcutaneous Tissue) oe. exposed. There is no tunneling or undermining noted. There is a medium amount of serosanguineous drainage noted. The wound margin is epibole. There is no granulation within the wound bed. There is a large (67-100%) amount of necrotic tissue within the wound bed including Adherent Slough. Wound #4 status is Open. Original cause of wound was Bump. The date acquired was: 01/14/2022. The wound is located on the Left,Anterior Lower Leg. The wound measures 0.5cm length x 0.7cm width x 0.1cm depth; 0.275cm^2 area and 0.027cm^3 volume. There is Fat Layer (Subcutaneous Tissue) exposed. There is no tunneling or undermining noted. There is a medium amount of serosanguineous drainage noted. The wound margin is distinct with the outline attached to the wound base. There is large (67-100%) red granulation within the wound bed. There is no necrotic tissue within the wound bed. Assessment Active  Problems ICD-10 Non-pressure chronic ulcer of other part of right foot with fat layer exposed Non-pressure chronic ulcer of other part of right lower leg with fat layer exposed Non-pressure chronic ulcer of other  part of left lower leg with fat layer exposed Other idiopathic peripheral autonomic neuropathy Osteomyelitis, unspecified Unspecified atrial fibrillation Long term (current) use of anticoagulants Lymphedema, not elsewhere classified Venous insufficiency (chronic) (peripheral) Chronic obstructive pulmonary disease, unspecified Patient presents with a 63-month history of nonhealing ulcer to the right second toe amputation site. I debrided nonviable tissue. I think she would benefit from [facility] antibiotic to address bioburden and I did a PCR culture today. I am concerned about her vascular status for wound healing as I could not feel pedal pulses easily. We will obtain formal arterial studies. In the meantime she can use [facility]in's wet-to-dry dressings to the wound site. Also recommended an x-ray to assure that there is no residual bony destruction suggesting osteomyelitis. Her bilateral lower extremity wounds are superficial and appear well-healing. For now I recommended bacitracin ointment and keeping the areas covered. If these do not improve over the next 1 to 2 weeks then she would likely need compression wraps. Follow-up in 1 week. 52 minutes was spent on the encounter including face-to-face, EMR review and coordination of care Procedures Wound #2 Pre-procedure diagnosis of Wound #2 is an Abrasion located on the Right,Lateral Lower Leg . There was a Chemical/Enzymatic/Mechanical debridement performed by Megan Stall, RN.. Other agent used was gauze and wound cleanser. There was no bleeding. The procedure was tolerated well. Post Debridement Measurements: 0.4cm length x 0.5cm width x 0.1cm depth; 0.016cm^3 volume. Character of Wound/Ulcer Post Debridement is stable. Post procedure  Diagnosis Wound #2: Same as Pre-Procedure Wound #3 Pre-procedure diagnosis of Wound #3 is an Open Surgical Wound located on the Right Amputation Site - T . There was a Excisional Skin/Subcutaneous oe Tissue Debridement with a total area of 0.55 sq cm performed by Megan Corwin, DO. With the following instrument(s): Curette to remove Viable and Non- Viable tissue/material. Material removed includes Subcutaneous Tissue and Slough and after achieving pain control using Other (benzocaine 20%). A time out was conducted at 10:20, prior to the start of the procedure. There was no bleeding. The procedure was tolerated well. Post Debridement Measurements: 1.1cm length x 0.5cm width x 0.5cm depth; 0.216cm^3 volume. Character of Wound/Ulcer Post Debridement is improved. Post procedure Diagnosis Wound #3: Same as Pre-Procedure Wound #4 Pre-procedure diagnosis of Wound #4 is an Abrasion located on the Left,Anterior Lower Leg . There was a Chemical/Enzymatic/Mechanical debridement performed by Megan Stall, RN.. Other agent used was gauze and wound cleanser. There was no bleeding. The procedure was tolerated well. Post Debridement Measurements: 0.5cm length x 0.7cm width x 0.1cm depth; 0.027cm^3 volume. Character of Wound/Ulcer Post Debridement is stable. Post procedure Diagnosis Wound #4: Same as Pre-Procedure Plan Follow-up Appointments: Return Appointment in 1 week. - Dr. Mikey Bussing and [place], Room 8 02/07/[address]22 [address]. Mikey Bussing and [place], Room 8 6/8/223 215pm Thursday Other: - ***Pick up at your pharmacy [facility].*** ***Pick up bacitracin ointment for the leg wounds.** ***Once culture is back and if positive will order from [facility]. They will call you to ship the topical antibiotics.*** Edema Control - Lymphedema / SCD / Other: Elevate legs to the level of the heart or above for 30 minutes daily and/or when sitting, a frequency of: - throughout the day. Avoid standing for long periods of  time. Home Health: New wound care orders this week; continue Home Health for wound care. Megan utilize formulary equivalent dressing for wound treatment orders unless otherwise specified. - Home health weekly dressing changes. All other days daughter to change. Other Home Health Orders/Instructions: -  [facility]. Radiology ordered were: X-ray, foot right - x-ray of right foot related to nonhealing surgical foot wound looking for infection. Laboratory ordered were: Aerobic culture-specimen not specified - PCR culture of right foot. Once the culture returns will call you if positive. Services and Therapies ordered were: Arterial Studies- Bilateral - Vein and Vascular arterial studies to Bilateral lower legs. The following medication(s) was prescribed: [facility] Solution miscellaneous 0.25 % solution 1 apply to gauze for wet to dry dressings starting 01/28/2022 WOUND #2: - Lower Leg Wound Laterality: Right, Lateral Cleanser: Soap and Water (Home Health) 1 x Per Day/30 Days Discharge Instructions: Megan shower and wash wound with dial antibacterial soap and water prior to dressing change. Cleanser: Wound Cleanser (Home Health) 1 x Per Day/30 Days Discharge Instructions: Cleanse the wound with wound cleanser prior to applying a clean dressing using gauze sponges, not tissue or cotton balls. Topical: bacitracin ointment (Home Health) 1 x Per Day/30 Days Discharge Instructions: Apply directly to wound bed. Secondary Dressing: Zetuvit Plus Silicone Border Dressing 4x4 (in/in) (Home Health) 1 x Per Day/30 Days Discharge Instructions: Apply silicone border or bandaids. WOUND #3: - Amputation Site - T oe Wound Laterality: Right Cleanser: Soap and Water (Home Health) 1 x Per Day/30 Days Discharge Instructions: Megan shower and wash wound with dial antibacterial soap and water prior to dressing change. Cleanser: Wound Cleanser (Home Health) 1 x Per Day/30 Days Discharge Instructions: Cleanse the wound  with wound cleanser prior to applying a clean dressing using gauze sponges, not tissue or cotton balls. Prim Dressing: [facility] Solution 0.25%, 16 (oz) (Home Health) 1 x Per Day/30 Days ary Discharge Instructions: Moisten gauze with [facility] solution Secondary Dressing: Woven Gauze Sponge, Non-Sterile 4x4 in (Home Health) 1 x Per Day/30 Days Discharge Instructions: Apply over primary dressing as directed. Secured With: Insurance underwriter, Sterile 2x75 (in/in) (Home Health) 1 x Per Day/30 Days Discharge Instructions: Secure with stretch gauze as directed. Secured With: Paper T ape, 2x10 (in/yd) (Home Health) 1 x Per Day/30 Days Discharge Instructions: Secure dressing with tape as directed. WOUND #4: - Lower Leg Wound Laterality: Left, Anterior Cleanser: Soap and Water (Home Health) 1 x Per Day/30 Days Discharge Instructions: Megan shower and wash wound with dial antibacterial soap and water prior to dressing change. Cleanser: Wound Cleanser (Home Health) 1 x Per Day/30 Days Discharge Instructions: Cleanse the wound with wound cleanser prior to applying a clean dressing using gauze sponges, not tissue or cotton balls. Topical: bacitracin ointment (Home Health) 1 x Per Day/30 Days Discharge Instructions: Apply directly to wound bed. Secondary Dressing: Zetuvit Plus Silicone Border Dressing 4x4 (in/in) (Home Health) 1 x Per Day/30 Days Discharge Instructions: Apply silicone border or bandaids. 1. In office sharp debridement 2. PCR culture 3. Right foot x-ray 4. [facility] wet-to-dry dressings 5. Bacitracin 6. Follow-up in 1 week Electronic Signature(s) Signed: 03/19/2022 11:[name]:41 AM By: Megan Corwin DO Previous Signature: 01/28/2022 12:39:58 PM Version By: Megan Corwin DO Entered By: [name] on 01/30/2022 16:34:57 -------------------------------------------------------------------------------- HxROS Details Patient Name: Date of Service: [name] TTIE M. 01/28/2022  9:00 A M Medical Record Number: [id number] Patient Account Number: [id number] Date of Birth/Sex: Treating RN: [name] (88 y.o. F) Primary Care Provider: Mila [name] Other Clinician: Referring Provider: Treating Provider/Extender: [name] in Treatment: 0 Information Obtained From Patient Caregiver Eyes Complaints and Symptoms: Positive for: Glasses / Contacts Medical History: Positive for: Cataracts Ear/Nose/Mouth/Throat Complaints and Symptoms: Negative for: Chronic sinus problems or rhinitis Integumentary (  Skin) Complaints and Symptoms: Positive for: Wounds - BLE Psychiatric Complaints and Symptoms: Negative for: Claustrophobia; Suicidal Hematologic/Lymphatic Medical History: Positive for: Anemia Past Medical History Notes: Thrombocytopenia Respiratory Medical History: Negative for: Chronic Obstructive Pulmonary Disease (COPD) Cardiovascular Complaints and Symptoms: Review of System Notes: Afib Medical History: Positive for: Congestive Heart Failure; Hypotension; Peripheral Venous Disease Gastrointestinal Complaints and Symptoms: Review of System Notes: GERD Endocrine Medical History: Past Medical History Notes: Hyperthyroidism, Hypothyroidism Genitourinary Musculoskeletal Medical History: Positive for: 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 Neurologic Medical History: Positive for: Neuropathy Oncologic HBO Extended History Items Eyes: Cataracts Immunizations Pneumococcal Vaccine: Received Pneumococcal Vaccination: No Implantable Devices None Family and Social History Cancer: Yes - Siblings; Diabetes: No; Heart Disease: Yes; Stroke: Yes - Mother; Former smoker - quit 2003; Marital Status - Widowed; Alcohol Use: Never; Drug Use: No History; Caffeine Use: Never; Financial Concerns: No; Food, Clothing or Shelter Needs: No; Support System Lacking: No; Transportation Concerns: No Electronic  Signature(s) Signed: 01/28/2022 12:39:58 PM By: Megan Corwin DO Signed: 02/18/2022 8:46:54 AM By: Thayer Dallas Entered By: Thayer Dallas on 01/28/2022 09:51:14 -------------------------------------------------------------------------------- SuperBill Details Patient Name: Date of Service: [name]. 01/28/2022 Medical Record Number: [id number] Patient Account Number: [id number] Date of Birth/Sex: Treating RN: [birth year] (88 y.o. [name], [name] Primary Care Provider: Mila [name] Other Clinician: Referring Provider: Treating Provider/Extender: [name] in Treatment: 0 Diagnosis Coding ICD-10 Codes Code Description [phone] Non-pressure chronic ulcer of other part of right foot with fat layer exposed L97.812 Non-pressure chronic ulcer of other part of right lower leg with fat layer exposed L97.822 Non-pressure chronic ulcer of other part of left lower leg with fat layer exposed G90.09 Other idiopathic peripheral autonomic neuropathy M86.9 Osteomyelitis, unspecified I48.91 Unspecified atrial fibrillation Z79.01 Long term (current) use of anticoagulants I89.0 Lymphedema, not elsewhere classified I87.2 Venous insufficiency (chronic) (peripheral) J44.9 Chronic obstructive pulmonary disease, unspecified Facility Procedures CPT4 Code: 46962952 Description: 99214 - WOUND CARE VISIT-LEV 4 EST PT Modifier: Quantity: 1 CPT4 Code: [id number] Description: 11042 - DEB SUBQ TISSUE 20 SQ CM/< ICD-10 Diagnosis Description L97.512 Non-pressure chronic ulcer of other part of right foot with fat layer exposed Modifier: Quantity: 1 Physician Procedures : CPT4 Code Description Modifier [id number] 99204 - WC PHYS LEVEL 4 - NEW PT ICD-10 Diagnosis Description L97.512 Non-pressure chronic ulcer of other part of right foot with fat layer exposed L97.812 Non-pressure chronic ulcer of other part of right lower  leg with fat layer exposed L97.822 Non-pressure chronic  ulcer of other part of left lower leg with fat layer exposed I87.2 Venous insufficiency (chronic) (peripheral) Quantity: 1 : [id number] 11042 - WC PHYS SUBQ TISS 20 SQ CM ICD-10 Diagnosis Description L97.512 Non-pressure chronic ulcer of other part of right foot with fat layer exposed Quantity: 1 Electronic Signature(s) Signed: 01/28/2022 12:39:58 PM By: Megan Corwin DO Entered By: [name] on 01/28/2022 12:39:18

## 2022-03-20 ENCOUNTER — Encounter (HOSPITAL_BASED_OUTPATIENT_CLINIC_OR_DEPARTMENT_OTHER): Payer: Medicare HMO | Attending: Internal Medicine | Admitting: Internal Medicine

## 2022-03-20 DIAGNOSIS — I872 Venous insufficiency (chronic) (peripheral): Secondary | ICD-10-CM | POA: Insufficient documentation

## 2022-03-20 DIAGNOSIS — Z89421 Acquired absence of other right toe(s): Secondary | ICD-10-CM | POA: Diagnosis not present

## 2022-03-20 DIAGNOSIS — I739 Peripheral vascular disease, unspecified: Secondary | ICD-10-CM | POA: Diagnosis not present

## 2022-03-20 DIAGNOSIS — L97822 Non-pressure chronic ulcer of other part of left lower leg with fat layer exposed: Secondary | ICD-10-CM | POA: Insufficient documentation

## 2022-03-20 DIAGNOSIS — L97512 Non-pressure chronic ulcer of other part of right foot with fat layer exposed: Secondary | ICD-10-CM | POA: Insufficient documentation

## 2022-03-20 DIAGNOSIS — M869 Osteomyelitis, unspecified: Secondary | ICD-10-CM

## 2022-03-20 DIAGNOSIS — I890 Lymphedema, not elsewhere classified: Secondary | ICD-10-CM | POA: Diagnosis not present

## 2022-03-20 DIAGNOSIS — J449 Chronic obstructive pulmonary disease, unspecified: Secondary | ICD-10-CM

## 2022-03-20 DIAGNOSIS — G9009 Other idiopathic peripheral autonomic neuropathy: Secondary | ICD-10-CM | POA: Diagnosis not present

## 2022-03-20 DIAGNOSIS — I509 Heart failure, unspecified: Secondary | ICD-10-CM | POA: Diagnosis not present

## 2022-03-20 NOTE — Progress Notes (Addendum)
Megan MorrowHILL, Megan M. ([id number]) Visit Report for 03/20/2022 Chief Complaint Document Details Patient Name: Date of Service: [name], [name] 03/20/2022 3:00 PM Medical Record Number: [id number] Patient Account Number: [id number] Date of Birth/Sex: Treating RN: 08/19/1928 (88 y.o. [name]) [name] Primary Care Provider: Mila PalmerWolters, [name] Other Clinician: Referring Provider: Treating Provider/Extender: Megan RiisHoffman, Zidane [name] Wolters, [name] Weeks in Treatment: 7 Information Obtained from: Patient Chief Complaint 01/28/2022; right second toe amputation site dehiscence and bilateral lower extremity wounds. Electronic Signature(s) Signed: 03/20/2022 4:21:31 PM By: Megan CorwinHoffman, Tavarius Grewe DO Entered By: Megan CorwinHoffman, Srah Ake on 03/20/2022 16:09:59 -------------------------------------------------------------------------------- HPI Details Patient Name: Date of Service: Catanese, [name] 03/20/2022 3:00 PM Medical Record Number: [id number] Patient Account Number: [id number] Date of Birth/Sex: Treating RN: 03/03/1928 (88 y.o. [name]) [name] Primary Care Provider: Mila PalmerWolters, [name] Other Clinician: Referring Provider: Treating Provider/Extender: Megan RiisHoffman, Charleton Deyoung Wolters, [name] Weeks in Treatment: 7 History of Present Illness HPI Description: Admission 01/28/2022 [name] Consiglio is a 88 year old female with a past medical history of idiopathic peripheral neuropathy status post amputation to the second right toe secondary to osteomyelitis, COPD and A-fib on Coumadin the presents to the clinic for a 1951-month history of nonhealing ulcer to a previous amputation site on the second right toe. She states she has tried Medihoney and silver alginate in the past to this area with little benefit. She also has 2 small areas limited to skin breakdown to her lower extremities bilaterally. She has chronic venous insufficiency but not has not been wearing her compression stockings. She states she bumped her legs against an object and not so  the wound started. She has been using Medihoney to the sites. She denies signs of infection. 6/2; patient presents for follow-up. She had an x-ray of her right foot done at last clinic visit and this was negative for evidence of osteomyelitis. She also had a wound culture done that showed extra high levels of Staph aureus. I recommended Keystone antibiotics for this and this was ordered. She had ABIs with TBI's done as well that showed monophasic waveforms to the right foot with TBI of 0 and an ABI of 0.52. Urgent referral was made to vein and vascular and she saw Dr. Durwin Noraixon on 6/1, yesterday and he recommended an arteriogram. This is scheduled for 6/16. Patient also reports a new wound to the right great toe. This is a blister that has ruptured. She also reports increased erythema to the toe. 6/6; the patient was worked in urgently today at the insistence of her daughter out of concern for a new wound on the lateral part of the plantar right great toe. She has her original postsurgical wound after the amputation of the right second toe, she has a wound on the medial part of the right great toe. The patient is apparently going for an angiogram by Dr. Durwin Noraixon in 2 weeks time. 6/13; patient presents for follow-up. She has been using bacitracin to the abrasion on the right great toe. She has been using collagen and Keystone antibiotic to the amputation site. She has no issues or complaints today. She denies signs of infection. 6/22; patient presents for follow-up. She states that her abdominal aortogram was canceled due to her renal function. She has been using Keystone antibiotics to the amputation site and Medihoney to the right great toe wound. At the pace of the right great toe she has a slitlike open area that she thinks was caused by the tape from the dressing. 6/29; patient presents for follow-up. She has been  using Keystone antibiotics to the amputation site along with collagen. She has been using  Medihoney to the right great toe wound. She has no other wounds. She denies signs of infection. 7/13; patient presents for follow-up. She has been using Keystone antibiotics and collagen to the wound sites. She currently denies signs of infection. She states she is scheduled to see me nephrology next month. Electronic Signature(s) Signed: 03/20/2022 4:21:31 PM By: Megan Corwin DO Entered By: [name] on 03/20/2022 16:10:36 -------------------------------------------------------------------------------- Physical Exam Details Patient Name: Date of Service: Chhim, [name] 03/20/2022 3:00 PM Medical Record Number: [id number] Patient Account Number: [id number] Date of Birth/Sex: Treating RN: May 07, 1928 (88 y.o. Megan Silence Primary Care Provider: [name] Other Clinician: Referring Provider: Treating Provider/Extender: [name] in Treatment: 7 Constitutional respirations regular, non-labored and within target range for patient.[name] Kitchen Psychiatric pleasant and cooperative. Notes Right foot: T the second digit there is an amputation site that is open with pale granulation tissue. Does not probe to bone. No surrounding signs of infection. o T the lateral aspect of the great toe there is an open wound with pale granulation tissue. No surrounding signs of infection to either wound bed. o Electronic Signature(s) Signed: 03/20/2022 4:21:31 PM By: Megan Corwin DO Entered By: [name] on 03/20/2022 16:13:05 -------------------------------------------------------------------------------- Physician Orders Details Patient Name: Date of Service: Megan Reap. 03/20/2022 3:00 PM Medical Record Number: [id number] Patient Account Number: [id number] Date of Birth/Sex: Treating RN: [birth year] (88 y.o. [name], [name] Primary Care Provider: [name] Other Clinician: Referring Provider: Treating Provider/Extender: [name] in Treatment: 7 Verbal / Phone Orders: No Diagnosis Coding ICD-10 Coding Code Description [phone] Non-pressure chronic ulcer of other part of right foot with fat layer exposed L97.822 Non-pressure chronic ulcer of other part of left lower leg with fat layer exposed G90.09 Other idiopathic peripheral autonomic neuropathy M86.9 Osteomyelitis, unspecified I48.91 Unspecified atrial fibrillation Z79.01 Long term (current) use of anticoagulants I89.0 Lymphedema, not elsewhere classified I87.2 Venous insufficiency (chronic) (peripheral) J44.9 Chronic obstructive pulmonary disease, unspecified Follow-up Appointments ppointment in 1 week. - Dr. Mikey Bussing and [place], Room 8 03/27/2022 3pm Thursday Return A Other: - [facility] continue to use when it arrives under the Prisma to right amputation toe site.*** bactracin under the right great toe for as needed protection. Edema Control - Lymphedema / SCD / Other Elevate legs to the level of the heart or above for 30 minutes daily and/or when sitting, a frequency of: - throughout the day. Avoid standing for long periods of time. Home Health No change in wound care orders this week; continue Home Health for wound care. May utilize formulary equivalent dressing for wound treatment orders unless otherwise specified. - Home health weekly dressing changes. All other days daughter to change. Bacitracin ointment under right great toe as needed. all wounds keystone topical antibiotics and prisma. Other Home Health Orders/Instructions: - [facility]. Wound Treatment Wound #3 - Amputation Site - Toe Wound Laterality: Right Cleanser: Soap and Water (Home Health) 1 x Per Day/30 Days Discharge Instructions: May shower and wash wound with dial antibacterial soap and water prior to dressing change. Cleanser: Wound Cleanser (Home Health) 1 x Per Day/30 Days Discharge Instructions: Cleanse the wound with wound cleanser prior to applying a  clean dressing using gauze sponges, not tissue or cotton balls. Topical: Compounding T opical Antibiotics (Home Health) 1 x Per Day/30 Days Discharge Instructions: Apply T opical Antibiotics from [facility] daily once  [place], applies over the Prisma. Prim Dressing: Promogran Prisma Matrix, 4.34 (sq in) (silver collagen) (Home Health) 1 x Per Day/30 Days ary Discharge Instructions: Moisten collagen with saline or hydrogel directly to wound bed. apply over the [place] when arrives. Secondary Dressing: Woven Gauze Sponges 2x2 in (Home Health) 1 x Per Day/30 Days Discharge Instructions: Apply over primary dressing. ***Apply a rolled gauze under the right great toe. Secured With: Insurance underwriter, Sterile 2x75 (in/in) (Home Health) 1 x Per Day/30 Days Discharge Instructions: Secure with stretch gauze or use paper tape to secure the gauze. Secured With: Paper Tape, 2x10 (in/yd) (Home Health) 1 x Per Day/30 Days Discharge Instructions: Secure dressing with tape as directed. Wound #5 - T Great oe Wound Laterality: Right Cleanser: Soap and Water (Home Health) 1 x Per Day/30 Days Discharge Instructions: May shower and wash wound with dial antibacterial soap and water prior to dressing change. Cleanser: Wound Cleanser (Home Health) 1 x Per Day/30 Days Discharge Instructions: Cleanse the wound with wound cleanser prior to applying a clean dressing using gauze sponges, not tissue or cotton balls. Topical: Compounding T opical Antibiotics (Home Health) 1 x Per Day/30 Days Discharge Instructions: Apply T opical Antibiotics from [facility] daily once it arrives, applies over the [place]. Prim Dressing: Promogran Prisma Matrix, 4.34 (sq in) (silver collagen) (Home Health) 1 x Per Day/30 Days ary Discharge Instructions: Moisten collagen with saline or hydrogel directly to wound bed. apply over the [place] when arrives. Secondary Dressing: Woven Gauze Sponges 2x2 in (Home  Health) 1 x Per Day/30 Days Discharge Instructions: Apply over primary dressing. may tape down with medipore tape. Secured With: Insurance underwriter, Sterile 2x75 (in/in) (Home Health) 1 x Per Day/30 Days Discharge Instructions: Secure with stretch gauze or use paper tape to secure the gauze. Secured With: Paper Tape, 2x10 (in/yd) (Home Health) 1 x Per Day/30 Days Discharge Instructions: Secure dressing with tape as directed. Electronic Signature(s) Signed: 03/20/2022 4:21:31 PM By: Megan Corwin DO Entered By: [name] on 03/20/2022 16:13:13 -------------------------------------------------------------------------------- Problem List Details Patient Name: Date of Service: Megan Reap. 03/20/2022 3:00 PM Medical Record Number: [id number] Patient Account Number: [id number] Date of Birth/Sex: Treating RN: [birth year] (88 y.o. [name], [name] Primary Care Provider: [name] Other Clinician: Referring Provider: Treating Provider/Extender: [name] in Treatment: 7 Active Problems ICD-10 Encounter Code Description Active Date MDM Diagnosis L97.512 Non-pressure chronic ulcer of other part of right foot with fat layer exposed 01/28/2022 No Yes L97.822 Non-pressure chronic ulcer of other part of left lower leg with fat layer exposed5/23/2023 No Yes G90.09 Other idiopathic peripheral autonomic neuropathy 01/28/2022 No Yes M86.9 Osteomyelitis, unspecified 01/28/2022 No Yes I48.91 Unspecified atrial fibrillation 01/28/2022 No Yes Z79.01 Long term (current) use of anticoagulants 01/28/2022 No Yes I89.0 Lymphedema, not elsewhere classified 01/28/2022 No Yes I87.2 Venous insufficiency (chronic) (peripheral) 01/28/2022 No Yes J44.9 Chronic obstructive pulmonary disease, unspecified 01/28/2022 No Yes Inactive Problems ICD-10 Code Description Active Date Inactive Date L97.812 Non-pressure chronic ulcer of other part of right lower leg with  fat layer exposed 01/28/2022 01/28/2022 Resolved Problems Electronic Signature(s) Signed: 03/20/2022 4:21:31 PM By: Megan Corwin DO Entered By: [name] on 03/20/2022 16:09:41 -------------------------------------------------------------------------------- Progress Note Details Patient Name: Date of Service: Megan Reap. 03/20/2022 3:00 PM Medical Record Number: [id number] Patient Account Number: [id number] Date of Birth/Sex: Treating RN: 17-Jan-1928 (88 y.o. [name], [name] Primary Care Provider: [name] Other Clinician: Referring Provider: Treating Provider/Extender: [name]  Megan Palmer Weeks in Treatment: 7 Subjective Chief Complaint Information obtained from Patient 01/28/2022; right second toe amputation site dehiscence and bilateral lower extremity wounds. History of Present Illness (HPI) Admission 01/28/2022 Ms. Sheretha Shadd is a 88 year old female with a past medical history of idiopathic peripheral neuropathy status post amputation to the second right toe secondary to osteomyelitis, COPD and A-fib on Coumadin the presents to the clinic for a 61-month history of nonhealing ulcer to a previous amputation site on the second right toe. She states she has tried Medihoney and silver alginate in the past to this area with little benefit. She also has 2 small areas limited to skin breakdown to her lower extremities bilaterally. She has chronic venous insufficiency but not has not been wearing her compression stockings. She states she bumped her legs against an object and not so the wound started. She has been using Medihoney to the sites. She denies signs of infection. 6/2; patient presents for follow-up. She had an x-ray of her right foot done at last clinic visit and this was negative for evidence of osteomyelitis. She also had a wound culture done that showed extra high levels of Staph aureus. I recommended Keystone antibiotics for this and this was  ordered. She had ABIs with TBI's done as well that showed monophasic waveforms to the right foot with TBI of 0 and an ABI of 0.52. Urgent referral was made to vein and vascular and she saw Dr. Durwin Nora on 6/1, yesterday and he recommended an arteriogram. This is scheduled for 6/16. Patient also reports a new wound to the right great toe. This is a blister that has ruptured. She also reports increased erythema to the toe. 6/6; the patient was worked in urgently today at the insistence of her daughter out of concern for a new wound on the lateral part of the plantar right great toe. She has her original postsurgical wound after the amputation of the right second toe, she has a wound on the medial part of the right great toe. The patient is apparently going for an angiogram by Dr. Durwin Nora in 2 weeks time. 6/13; patient presents for follow-up. She has been using bacitracin to the abrasion on the right great toe. She has been using collagen and Keystone antibiotic to the amputation site. She has no issues or complaints today. She denies signs of infection. 6/22; patient presents for follow-up. She states that her abdominal aortogram was canceled due to her renal function. She has been using Keystone antibiotics to the amputation site and Medihoney to the right great toe wound. At the pace of the right great toe she has a slitlike open area that she thinks was caused by the tape from the dressing. 6/29; patient presents for follow-up. She has been using Keystone antibiotics to the amputation site along with collagen. She has been using Medihoney to the right great toe wound. She has no other wounds. She denies signs of infection. 7/13; patient presents for follow-up. She has been using Keystone antibiotics and collagen to the wound sites. She currently denies signs of infection. She states she is scheduled to see me nephrology next month. Patient History Information obtained from Patient, Caregiver. Family  History Cancer - Siblings, Heart Disease, Stroke - Mother, No family history of Diabetes. Social History Former smoker - quit 2003, Marital Status - Widowed, Alcohol Use - Never, Drug Use - No History, Caffeine Use - Never. Medical History Eyes Patient has history of Cataracts Hematologic/Lymphatic Patient has history of Anemia  Respiratory Denies history of Chronic Obstructive Pulmonary Disease (COPD) Cardiovascular Patient has history of Congestive Heart Failure, Hypotension, Peripheral Venous Disease Musculoskeletal Patient has history of 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 Neurologic Patient has history of Neuropathy Medical A Surgical History Notes nd Hematologic/Lymphatic Thrombocytopenia Endocrine Hyperthyroidism, Hypothyroidism Objective Constitutional respirations regular, non-labored and within target range for patient.. Vitals Time Taken: 3:12 PM, Height: 62 in, Weight: 181 lbs, BMI: 33.1, Temperature: 98.3 F, Pulse: 56 bpm, Respiratory Rate: 18 breaths/min, Blood Pressure: 120/65 mmHg. Psychiatric pleasant and cooperative. General Notes: Right foot: T the second digit there is an amputation site that is open with pale granulation tissue. Does not probe to bone. No surrounding o signs of infection. T the lateral aspect of the great toe there is an open wound with pale granulation tissue. No surrounding signs of infection to either wound o bed. Integumentary (Hair, Skin) Wound #3 status is Open. Original cause of wound was Surgical Injury. The date acquired was: 11/17/2021. The wound has been in treatment 7 weeks. The wound is located on the Right Amputation Site - T The wound measures 0.6cm length x 0.2cm width x 0.4cm depth; 0.094cm^2 area and 0.038cm^3 volume. oe. There is Fat Layer (Subcutaneous Tissue) exposed. There is no tunneling or undermining noted. There is a medium amount of serosanguineous drainage noted. The wound margin is epibole. There  is small (1-33%) red granulation within the wound bed. There is a large (67-100%) amount of necrotic tissue within the wound bed including Adherent Slough. Wound #5 status is Open. Original cause of wound was Shear/Friction. The date acquired was: 02/06/2022. The wound has been in treatment 5 weeks. The wound is located on the Right T Great. The wound measures 0.4cm length x 0.2cm width x 0.1cm depth; 0.063cm^2 area and 0.006cm^3 volume. There is Fat Layer oe (Subcutaneous Tissue) exposed. There is no tunneling or undermining noted. There is a medium amount of serosanguineous drainage noted. The wound margin is distinct with the outline attached to the wound base. There is small (1-33%) pink, pale granulation within the wound bed. There is a large (67-100%) amount of necrotic tissue within the wound bed. Assessment Active Problems ICD-10 Non-pressure chronic ulcer of other part of right foot with fat layer exposed Non-pressure chronic ulcer of other part of left lower leg with fat layer exposed Other idiopathic peripheral autonomic neuropathy Osteomyelitis, unspecified Unspecified atrial fibrillation Long term (current) use of anticoagulants Lymphedema, not elsewhere classified Venous insufficiency (chronic) (peripheral) Chronic obstructive pulmonary disease, unspecified Patient's wounds are stable. No signs of surrounding infection. I recommended continuing collagen and Keystone antibiotics. She does not have adequate blood flow for wound healing. She cannot obtain an arteriogram due to her kidney function currently. She is scheduled to see nephrology next month to address this. Plan Follow-up Appointments: Return Appointment in 1 week. - Dr. Mikey Bussing and [place], Room 8 03/27/2022 3pm Thursday Other: [facility] continue to use when it arrives under the Prisma to right amputation toe site.*** bactracin under the right great toe for as needed protection. Edema Control - Lymphedema /  SCD / Other: Elevate legs to the level of the heart or above for 30 minutes daily and/or when sitting, a frequency of: - throughout the day. Avoid standing for long periods of time. Home Health: No change in wound care orders this week; continue Home Health for wound care. May utilize formulary equivalent dressing for wound treatment orders unless otherwise specified. - Home health weekly dressing changes. All  other days daughter to change. Bacitracin ointment under right great toe as needed. all wounds keystone topical antibiotics and prisma. Other Home Health Orders/Instructions: - [facility]. WOUND #3: - Amputation Site - T oe Wound Laterality: Right Cleanser: Soap and Water (Home Health) 1 x Per Day/30 Days Discharge Instructions: May shower and wash wound with dial antibacterial soap and water prior to dressing change. Cleanser: Wound Cleanser (Home Health) 1 x Per Day/30 Days Discharge Instructions: Cleanse the wound with wound cleanser prior to applying a clean dressing using gauze sponges, not tissue or cotton balls. Topical: Compounding T opical Antibiotics (Home Health) 1 x Per Day/30 Days Discharge Instructions: Apply T opical Antibiotics from [facility] daily once it arrives, applies over the [place]. Prim Dressing: Promogran Prisma Matrix, 4.34 (sq in) (silver collagen) (Home Health) 1 x Per Day/30 Days ary Discharge Instructions: Moisten collagen with saline or hydrogel directly to wound bed. apply over the [place] when arrives. Secondary Dressing: Woven Gauze Sponges 2x2 in (Home Health) 1 x Per Day/30 Days Discharge Instructions: Apply over primary dressing. ***Apply a rolled gauze under the right great toe. Secured With: Insurance underwriter, Sterile 2x75 (in/in) (Home Health) 1 x Per Day/30 Days Discharge Instructions: Secure with stretch gauze or use paper tape to secure the gauze. Secured With: Paper T ape, 2x10 (in/yd) (Home Health) 1 x Per  Day/30 Days Discharge Instructions: Secure dressing with tape as directed. WOUND #5: - T Great Wound Laterality: Right oe Cleanser: Soap and Water (Home Health) 1 x Per Day/30 Days Discharge Instructions: May shower and wash wound with dial antibacterial soap and water prior to dressing change. Cleanser: Wound Cleanser (Home Health) 1 x Per Day/30 Days Discharge Instructions: Cleanse the wound with wound cleanser prior to applying a clean dressing using gauze sponges, not tissue or cotton balls. Topical: Compounding T opical Antibiotics (Home Health) 1 x Per Day/30 Days Discharge Instructions: Apply T opical Antibiotics from [facility] daily once it arrives, applies over the [place]. Prim Dressing: Promogran Prisma Matrix, 4.34 (sq in) (silver collagen) (Home Health) 1 x Per Day/30 Days ary Discharge Instructions: Moisten collagen with saline or hydrogel directly to wound bed. apply over the [place] when arrives. Secondary Dressing: Woven Gauze Sponges 2x2 in (Home Health) 1 x Per Day/30 Days Discharge Instructions: Apply over primary dressing. may tape down with medipore tape. Secured With: Insurance underwriter, Sterile 2x75 (in/in) (Home Health) 1 x Per Day/30 Days Discharge Instructions: Secure with stretch gauze or use paper tape to secure the gauze. Secured With: Paper Tape, 2x10 (in/yd) (Home Health) 1 x Per Day/30 Days Discharge Instructions: Secure dressing with tape as directed. 1. Keystone antibiotic and collagen to the wound beds 2. Follow-up in 1 week Electronic Signature(s) Signed: 03/20/2022 4:21:31 PM By: Megan Corwin DO Entered By: [name] on 03/20/2022 16:15:12 -------------------------------------------------------------------------------- HxROS Details Patient Name: Date of Service: Cheese, [name] 03/20/2022 3:00 PM Medical Record Number: [id number] Patient Account Number: [id number] Date of Birth/Sex: Treating RN: 1928/04/29 (88 y.o. Megan Silence Primary Care Provider: [name] Other Clinician: Referring Provider: Treating Provider/Extender: [name] in Treatment: 7 Information Obtained From Patient Caregiver Eyes Medical History: Positive for: Cataracts Hematologic/Lymphatic Medical History: Positive for: Anemia Past Medical History Notes: Thrombocytopenia Respiratory Medical History: Negative for: Chronic Obstructive Pulmonary Disease (COPD) Cardiovascular Medical History: Positive for: Congestive Heart Failure; Hypotension; Peripheral Venous Disease Endocrine Medical History: Past Medical History Notes: Hyperthyroidism, Hypothyroidism Musculoskeletal Medical History: Positive for:  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 Neurologic Medical History: Positive for: Neuropathy HBO Extended History Items Eyes: Cataracts Immunizations Pneumococcal Vaccine: Received Pneumococcal Vaccination: No Implantable Devices None Family and Social History Cancer: Yes - Siblings; Diabetes: No; Heart Disease: Yes; Stroke: Yes - Mother; Former smoker - quit 2003; Marital Status - Widowed; Alcohol Use: Never; Drug Use: No History; Caffeine Use: Never; Financial Concerns: No; Food, Clothing or Shelter Needs: No; Support System Lacking: No; Transportation Concerns: No Electronic Signature(s) Signed: 03/20/2022 4:21:31 PM By: Megan Corwin DO Signed: 03/20/2022 4:34:49 PM By: Shawn Stall RN, BSN Entered By: [name] on 03/20/2022 16:11:33 -------------------------------------------------------------------------------- SuperBill Details Patient Name: Date of Service: Megan Reap. 03/20/2022 Medical Record Number: [id number] Patient Account Number: [id number] Date of Birth/Sex: Treating RN: 12/05/1927 (88 y.o. [name], [name] Primary Care Provider: [name] Other Clinician: Referring Provider: Treating Provider/Extender: [name] in Treatment: 7 Diagnosis Coding ICD-10 Codes Code Description [phone] Non-pressure chronic ulcer of other part of right foot with fat layer exposed L97.822 Non-pressure chronic ulcer of other part of left lower leg with fat layer exposed G90.09 Other idiopathic peripheral autonomic neuropathy M86.9 Osteomyelitis, unspecified I48.91 Unspecified atrial fibrillation Z79.01 Long term (current) use of anticoagulants I89.0 Lymphedema, not elsewhere classified I87.2 Venous insufficiency (chronic) (peripheral) J44.9 Chronic obstructive pulmonary disease, unspecified Facility Procedures CPT4 Code: [id number] Description: 99214 - WOUND CARE VISIT-LEV 4 EST PT Modifier: Quantity: 1 Physician Procedures : CPT4 Code Description Modifier [id number] - WC PHYS LEVEL 3 - EST PT ICD-10 Diagnosis Description L97.512 Non-pressure chronic ulcer of other part of right foot with fat layer exposed M86.9 Osteomyelitis, unspecified J44.9 Chronic obstructive  pulmonary disease, unspecified I87.2 Venous insufficiency (chronic) (peripheral) Quantity: 1 Electronic Signature(s) Signed: 03/26/2022 3:48:19 PM By: Shawn Stall RN, BSN Signed: 03/27/2022 4:23:48 PM By: Megan Corwin DO Previous Signature: 03/20/2022 4:21:31 PM Version By: Megan Corwin DO Entered By: Shawn Stall on 03/26/2022 15:48:19

## 2022-03-21 NOTE — Progress Notes (Signed)
[name] ([id number]) Visit Report for 03/20/2022 Arrival Information Details Patient Name: Date of Service: [name]. 03/20/2022 3:00 PM Medical Record Number: [id number] Patient Account Number: [id number] Date of Birth/Sex: Treating RN: 06/30/1928 (88 y.o. [name], [name] Primary Care [name]: [name] Other Clinician: Referring [name]: Treating [name]/Extender: [name] in Treatment: 7 Visit Information History Since Last Visit Added or deleted any medications: Yes Patient Arrived: Wheel Chair Any new allergies or adverse reactions: No Arrival Time: 15:07 Had a fall or experienced change in No Accompanied By: daughter activities of daily living that may affect Transfer Assistance: None risk of falls: Patient Identification Verified: Yes Signs or symptoms of abuse/neglect since last visito No Secondary Verification Process Completed: Yes Hospitalized since last visit: No Patient Requires Transmission-Based Precautions: No Implantable device outside of the clinic excluding No Patient Has Alerts: Yes cellular tissue based products placed in the center Patient Alerts: Patient on Blood Thinner since last visit: Has Dressing in Place as Prescribed: Yes Pain Present Now: No Electronic Signature(s) Signed: 03/20/2022 4:37:01 PM By: [name] Entered By: [name] on 03/20/2022 15:12:30 -------------------------------------------------------------------------------- Clinic Level of Care Assessment Details Patient Name: Date of Service: [name] 03/20/2022 3:00 PM Medical Record Number: [id number] Patient Account Number: [id number] Date of Birth/Sex: Treating RN: [birth year] (88 y.o. [name], [name] Primary Care [name]: [name] Other Clinician: Referring [name]: Treating [name]/Extender: [name] in Treatment: 7 Clinic Level of Care Assessment Items TOOL 4 Quantity  Score X- 1 0 Use when only an EandM is performed on FOLLOW-UP visit ASSESSMENTS - Nursing Assessment / Reassessment X- 1 10 Reassessment of Co-morbidities (includes updates in patient status) X- 1 5 Reassessment of Adherence to Treatment Plan ASSESSMENTS - Wound and Skin A ssessment / Reassessment []  - 0 Simple Wound Assessment / Reassessment - one wound X- 2 5 Complex Wound Assessment / Reassessment - multiple wounds X- 1 10 Dermatologic / Skin Assessment (not related to wound area) ASSESSMENTS - Focused Assessment X- 1 5 Circumferential Edema Measurements - multi extremities X- 1 10 Nutritional Assessment / Counseling / Intervention []  - 0 Lower Extremity Assessment (monofilament, tuning fork, pulses) []  - 0 Peripheral Arterial Disease Assessment (using hand held doppler) ASSESSMENTS - Ostomy and/or Continence Assessment and Care []  - 0 Incontinence Assessment and Management []  - 0 Ostomy Care Assessment and Management (repouching, etc.) PROCESS - Coordination of Care []  - 0 Simple Patient / Family Education for ongoing care X- 1 20 Complex (extensive) Patient / Family Education for ongoing care X- 1 10 Staff obtains , Records, T Results / Process Orders est X- 1 10 Staff telephones HHA, Nursing Homes / Clarify orders / etc []  - 0 Routine Transfer to another Facility (non-emergent condition) []  - 0 Routine Hospital Admission (non-emergent condition) []  - 0 New Admissions / / Ordering NPWT Apligraf, etc. , []  - 0 Emergency Hospital Admission (emergent condition) []  - 0 Simple Discharge Coordination X- 1 15 Complex (extensive) Discharge Coordination PROCESS - Special Needs []  - 0 Pediatric / Minor Patient Management []  - 0 Isolation Patient Management []  - 0 Hearing / Language / Visual special needs []  - 0 Assessment of Community assistance (transportation, D/C planning, etc.) []  - 0 Additional assistance / Altered  mentation []  - 0 Support Surface(s) Assessment (bed, cushion, seat, etc.) INTERVENTIONS - Wound Cleansing / Measurement []  - 0 Simple Wound Cleansing - one wound X- 2 5 Complex Wound Cleansing -  multiple wounds X- 1 5 Wound Imaging (photographs - any number of wounds) []  - 0 Wound Tracing (instead of photographs) []  - 0 Simple Wound Measurement - one wound X- 2 5 Complex Wound Measurement - multiple wounds INTERVENTIONS - Wound Dressings X - Small Wound Dressing one or multiple wounds 2 10 []  - 0 Medium Wound Dressing one or multiple wounds []  - 0 Large Wound Dressing one or multiple wounds []  - 0 Application of Medications - topical []  - 0 Application of Medications - injection INTERVENTIONS - Miscellaneous []  - 0 External ear exam []  - 0 Specimen Collection (cultures, biopsies, blood, body fluids, etc.) []  - 0 Specimen(s) / Culture(s) sent or taken to Lab for analysis []  - 0 Patient Transfer (multiple staff / / Similar devices) []  - 0 Simple Staple / Suture removal (25 or less) []  - 0 Complex Staple / Suture removal (26 or more) []  - 0 Hypo / Hyperglycemic Management (close monitor of Blood Glucose) []  - 0 Ankle / Brachial Index (ABI) - do not check if billed separately X- 1 5 Vital Signs Has the patient been seen at the hospital within the last three years: Yes Total Score: 155 Level Of Care: New/Established - Level 4 Electronic Signature(s) Signed: 03/20/2022 4:34:49 PM By: RN, BSN Entered By: on 03/20/2022 15:41:28 -------------------------------------------------------------------------------- Encounter Discharge Information Details Patient Name: Date of Service: . 03/20/2022 3:00 PM Medical Record Number: Patient Account Number: Date of Birth/Sex: Treating RN: 08/28/1928 (88 y.o. Primary Care [name]: Other Clinician: Referring [name]: Treating  [name]/Extender: in Treatment: 7 Encounter Discharge Information Items Discharge Condition: Stable Ambulatory Status: Wheelchair Discharge Destination: Home Transportation: Private Auto Accompanied By: daughter Schedule Follow-up Appointment: Yes Clinical Summary of Care: Electronic Signature(s) Signed: 03/20/2022 4:34:49 PM By: 03/22/2022 RN, BSN Entered By: Shawn Stall on 03/20/2022 15:42:04 -------------------------------------------------------------------------------- Lower Extremity Assessment Details Patient Name: Date of Service: 03/22/2022. 03/20/2022 3:00 PM Medical Record Number: 03/22/2022 Patient Account Number: [id number] Date of Birth/Sex: Treating RN: Mar 16, 1928 (88 y.o. 99, [name] Primary Care Scottie Metayer: [name] Other Clinician: Referring Alithea Lapage: Treating Klaus Casteneda/Extender: [name] Weeks in Treatment: 7 Edema Assessment Assessed: [name]: No] 03/22/2022: Yes] Edema: [Left: Yes] [Right: Yes] Calf Left: Right: Point of Measurement: 30 cm From Medial Instep 42 cm Ankle Left: Right: Point of Measurement: 9 cm From Medial Instep 23.5 cm Vascular Assessment Pulses: Dorsalis Pedis Palpable: [Right:Yes] Electronic Signature(s) Signed: 03/21/2022 11:57:18 AM By: Shawn Stall RN Entered By: 03/22/2022 on 03/20/2022 15:22:12 -------------------------------------------------------------------------------- Multi Wound Chart Details Patient Name: Date of Service: 03/22/2022. 03/20/2022 3:00 PM Medical Record Number: [id number] Patient Account Number: 05/11/1928 Date of Birth/Sex: Treating RN: [birth year] (88 y.o. [name], [name] Primary Care Eisa Necaise: Kyra Searles Other Clinician: Referring Hosie Sharman: Treating Maebry Obrien/Extender: Franne Forts in Treatment: 7 Vital Signs Height(in): 62 Pulse(bpm): 56 Weight(lbs): 181 Blood  Pressure(mmHg): 120/65 Body Mass Index(BMI): 33.1 Temperature(F): 98.3 Respiratory Rate(breaths/min): 18 Photos: [N/A:N/A] Right Amputation Site - Toe Right T Great oe N/A Wound Location: Surgical Injury Shear/Friction N/A Wounding Event: Open Surgical Wound Abrasion N/A Primary Etiology: Cataracts, Anemia, Congestive Heart Cataracts, Anemia, Congestive Heart N/A Comorbid History: Failure, Hypotension, Peripheral Failure, Hypotension, Peripheral Venous Disease, Osteomyelitis, Venous Disease, Osteomyelitis, Neuropathy Neuropathy 11/17/2021 02/06/2022 N/A Date Acquired: 7 5 N/A Weeks of Treatment: Open Open N/A Wound Status: No No N/A Wound Recurrence: 0.6x0.2x0.4  0.4x0.2x0.1 N/A Measurements L x W x D (cm) 0.094 0.063 N/A A (cm) : rea 0.038 0.006 N/A Volume (cm) : 78.20% 83.60% N/A % Reduction in Area: 82.40% 84.20% N/A % Reduction in Volume: Full Thickness Without Exposed Full Thickness Without Exposed N/A Classification: Support Structures Support Structures Medium Medium N/A Exudate Amount: Serosanguineous Serosanguineous N/A Exudate Type: red, brown red, brown N/A Exudate Color: Epibole Distinct, outline attached N/A Wound Margin: Small (1-33%) Small (1-33%) N/A Granulation Amount: Red Pink, Pale N/A Granulation Quality: Large (67-100%) Large (67-100%) N/A Necrotic Amount: Fat Layer (Subcutaneous Tissue): Yes Fat Layer (Subcutaneous Tissue): Yes N/A Exposed Structures: Fascia: No Fascia: No Tendon: No Tendon: No Muscle: No Muscle: No Joint: No Joint: No Bone: No Bone: No Small (1-33%) Small (1-33%) N/A Epithelialization: Treatment Notes Wound #3 (Amputation Site - Toe) Wound Laterality: Right Cleanser Soap and Water Discharge Instruction: May shower and wash wound with dial antibacterial soap and water prior to dressing change. Wound Cleanser Discharge Instruction: Cleanse the wound with wound cleanser prior to applying a clean dressing  using gauze sponges, not tissue or cotton balls. Peri-Wound Care Topical Compounding T opical Antibiotics Discharge Instruction: [facility] once it arrives, applies over the [place]. Primary Dressing Promogran Prisma Matrix, 4.34 (sq in) (silver collagen) Discharge Instruction: Moisten collagen with saline or hydrogel directly to wound bed. apply over the [place] when arrives. Secondary Dressing Woven Gauze Sponges 2x2 in Discharge Instruction: Apply over primary dressing. ***Apply a rolled gauze under the right great toe. Secured With Conforming Stretch Gauze Bandage, Sterile 2x75 (in/in) Discharge Instruction: Secure with stretch gauze or use paper tape to secure the gauze. Paper Tape, 2x10 (in/yd) Discharge Instruction: Secure dressing with tape as directed. Compression Wrap Compression Stockings Add-Ons Wound #5 (Toe Great) Wound Laterality: Right Cleanser Soap and Water Discharge Instruction: May shower and wash wound with dial antibacterial soap and water prior to dressing change. Wound Cleanser Discharge Instruction: Cleanse the wound with wound cleanser prior to applying a clean dressing using gauze sponges, not tissue or cotton balls. Peri-Wound Care Topical Compounding T opical Antibiotics Discharge Instruction: [facility] once it arrives, applies over the [place]. Primary Dressing Promogran Prisma Matrix, 4.34 (sq in) (silver collagen) Discharge Instruction: Moisten collagen with saline or hydrogel directly to wound bed. apply over the [place] when arrives. Secondary Dressing Woven Gauze Sponges 2x2 in Discharge Instruction: Apply over primary dressing. may tape down with medipore tape. Secured With Conforming Stretch Gauze Bandage, Sterile 2x75 (in/in) Discharge Instruction: Secure with stretch gauze or use paper tape to secure the gauze. Paper Tape, 2x10 (in/yd) Discharge  Instruction: Secure dressing with tape as directed. Compression Wrap Compression Stockings Add-Ons Electronic Signature(s) Signed: 03/20/2022 4:21:31 PM By: Geralyn CorwinHoffman, Jessica DO Signed: 03/20/2022 4:34:49 PM By: Shawn Stalleaton, Bobbi RN, BSN Signed: 03/20/2022 4:34:49 PM By: Shawn Stalleaton, Bobbi RN, BSN Entered By: Geralyn CorwinHoffman, Jessica on 03/20/2022 16:09:47 -------------------------------------------------------------------------------- Multi-Disciplinary Care Plan Details Patient Name: Date of Service: [name], [name] 03/20/2022 3:00 PM Medical Record Number: [id number] Patient Account Number: [id number] Date of Birth/Sex: Treating RN: 06/08/1928 (88 y.o. [name]) Deaton, Bobbi Primary Care Stephenie Navejas: Megan PalmerWolters, Sharon Other Clinician: Referring Mayetta Castleman: Treating Shayn Madole/Extender: Megan RiisHoffman, Jessica Wolters, Sharon Weeks in Treatment: 7 Active Inactive Nutrition Nursing Diagnoses: Potential for alteratiion in Nutrition/Potential for imbalanced nutrition Goals: Patient/caregiver agrees to and verbalizes understanding of need to use nutritional supplements and/or vitamins as prescribed Date Initiated: 01/28/2022 Target Resolution Date: 04/04/2022 Goal Status: Active Interventions: Assess  patient nutrition upon admission and as needed per policy Provide education on nutrition Treatment Activities: Patient referred to Primary Care Physician for further nutritional evaluation : 01/28/2022 Notes: Pain, Acute or Chronic Nursing Diagnoses: Pain, acute or chronic: actual or potential Potential alteration in comfort, pain Goals: Patient will verbalize adequate pain control and receive pain control interventions during procedures as needed Date Initiated: 01/28/2022 Target Resolution Date: 04/04/2022 Goal Status: Active Interventions: Encourage patient to take pain medications as prescribed Provide education on pain management Treatment Activities: Administer pain control measures as ordered :  01/28/2022 Notes: Wound/Skin Impairment Nursing Diagnoses: Knowledge deficit related to ulceration/compromised skin integrity Goals: Patient/caregiver will verbalize understanding of skin care regimen Date Initiated: 01/28/2022 Target Resolution Date: 04/04/2022 Goal Status: Active Interventions: Assess patient/caregiver ability to perform ulcer/skin care regimen upon admission and as needed Assess ulceration(s) every visit Provide education on ulcer and skin care Treatment Activities: Skin care regimen initiated : 01/28/2022 Topical wound management initiated : 01/28/2022 Notes: Electronic Signature(s) Signed: 03/20/2022 4:34:49 PM By: Shawn Stall RN, BSN Entered By: Shawn Stall on 03/20/2022 15:14:05 -------------------------------------------------------------------------------- Pain Assessment Details Patient Name: Date of Service: Megan Reap. 03/20/2022 3:00 PM Medical Record Number: [id number] Patient Account Number: [id number] Date of Birth/Sex: Treating RN: 29-Aug-1928 (88 y.o. [name], [name] Primary Care Nakul Avino: [name] Other Clinician: Referring Brinlyn Cena: Treating Junior Huezo/Extender: [name] in Treatment: 7 Active Problems Location of Pain Severity and Description of Pain Patient Has Paino No Site Locations Pain Management and Medication Current Pain Management: Electronic Signature(s) Signed: 03/20/2022 4:34:49 PM By: Shawn Stall RN, BSN Signed: 03/20/2022 4:37:01 PM By: [name] Entered By: [name] on 03/20/2022 15:14:55 -------------------------------------------------------------------------------- Patient/Caregiver Education Details Patient Name: Date of Service: Megan Reap 7/13/2023andnbsp3:00 PM Medical Record Number: [id number] Patient Account Number: [id number] Date of Birth/Gender: Treating RN: 03-Feb-1928 (88 y.o. [name] Primary Care Physician: [name] Other  Clinician: Referring Physician: Treating Physician/Extender: [name] in Treatment: 7 Education Assessment Education Provided To: Patient Education Topics Provided Wound/Skin Impairment: Handouts: Skin Care Do's and Dont's Methods: Explain/Verbal Responses: Reinforcements needed Electronic Signature(s) Signed: 03/20/2022 4:34:49 PM By: Shawn Stall RN, BSN Entered By: Shawn Stall on 03/20/2022 15:14:15 -------------------------------------------------------------------------------- Wound Assessment Details Patient Name: Date of Service: Megan Reap. 03/20/2022 3:00 PM Medical Record Number: [id number] Patient Account Number: [id number] Date of Birth/Sex: Treating RN: October 24, 1927 (88 y.o. [name], [name] Primary Care Lyrique Hakim: [name] Other Clinician: Referring Kriste Broman: Treating Jaryah Aracena/Extender: [name] Weeks in Treatment: 7 Wound Status Wound Number: 3 Primary Open Surgical Wound Etiology: Wound Location: Right Amputation Site - Toe Wound Open Wounding Event: Surgical Injury Status: Date Acquired: 11/17/2021 Comorbid Cataracts, Anemia, Congestive Heart Failure, Hypotension, Weeks Of Treatment: 7 History: Peripheral Venous Disease, Osteomyelitis, Neuropathy Clustered Wound: No Photos Wound Measurements Length: (cm) 0.6 Width: (cm) 0.2 Depth: (cm) 0.4 Area: (cm) 0.094 Volume: (cm) 0.038 % Reduction in Area: 78.2% % Reduction in Volume: 82.4% Epithelialization: Small (1-33%) Tunneling: No Undermining: No Wound Description Classification: Full Thickness Without Exposed Support Structures Wound Margin: Epibole Exudate Amount: Medium Exudate Type: Serosanguineous Exudate Color: red, brown Foul Odor After Cleansing: No Slough/Fibrino Yes Wound Bed Granulation Amount: Small (1-33%) Exposed Structure Granulation Quality: Red Fascia Exposed: No Necrotic Amount: Large (67-100%) Fat Layer  (Subcutaneous Tissue) Exposed: Yes Necrotic Quality: Adherent Slough Tendon Exposed: No Muscle Exposed: No Joint Exposed: No Bone Exposed: No Treatment Notes Wound #3 (Amputation Site - Toe) Wound Laterality: Right Cleanser  Soap and Water Discharge Instruction: May shower and wash wound with dial antibacterial soap and water prior to dressing change. Wound Cleanser Discharge Instruction: Cleanse the wound with wound cleanser prior to applying a clean dressing using gauze sponges, not tissue or cotton balls. Peri-Wound Care Topical Compounding T opical Antibiotics Discharge Instruction: Apply T opical Antibiotics from [facility] daily once it arrives, applies over the [place]. Primary Dressing Promogran Prisma Matrix, 4.34 (sq in) (silver collagen) Discharge Instruction: Moisten collagen with saline or hydrogel directly to wound bed. apply over the [place] when arrives. Secondary Dressing Woven Gauze Sponges 2x2 in Discharge Instruction: Apply over primary dressing. ***Apply a rolled gauze under the right great toe. Secured With Conforming Stretch Gauze Bandage, Sterile 2x75 (in/in) Discharge Instruction: Secure with stretch gauze or use paper tape to secure the gauze. Paper Tape, 2x10 (in/yd) Discharge Instruction: Secure dressing with tape as directed. Compression Wrap Compression Stockings Add-Ons Electronic Signature(s) Signed: 03/21/2022 11:57:18 AM By: Fonnie Mu RN Entered By: Fonnie Mu on 03/20/2022 15:24:18 -------------------------------------------------------------------------------- Wound Assessment Details Patient Name: Date of Service: Megan Reap. 03/20/2022 3:00 PM Medical Record Number: [id number] Patient Account Number: [id number] Date of Birth/Sex: Treating RN: 1928-03-31 (88 y.o. [name], [name] Primary Care Sye Schroepfer: [name] Other Clinician: Referring Myalee Stengel: Treating Graciana Sessa/Extender: [name] Weeks in Treatment: 7 Wound Status Wound Number: 5 Primary Abrasion Etiology: Wound Location: Right T Great oe Wound Open Wounding Event: Shear/Friction Status: Date Acquired: 02/06/2022 Comorbid Cataracts, Anemia, Congestive Heart Failure, Hypotension, Weeks Of Treatment: 5 History: Peripheral Venous Disease, Osteomyelitis, Neuropathy Clustered Wound: No Photos Wound Measurements Length: (cm) 0.4 Width: (cm) 0.2 Depth: (cm) 0.1 Area: (cm) 0.063 Volume: (cm) 0.006 % Reduction in Area: 83.6% % Reduction in Volume: 84.2% Epithelialization: Small (1-33%) Tunneling: No Undermining: No Wound Description Classification: Full Thickness Without Exposed Support Structures Wound Margin: Distinct, outline attached Exudate Amount: Medium Exudate Type: Serosanguineous Exudate Color: red, brown Foul Odor After Cleansing: No Slough/Fibrino Yes Wound Bed Granulation Amount: Small (1-33%) Exposed Structure Granulation Quality: Pink, Pale Fascia Exposed: No Necrotic Amount: Large (67-100%) Fat Layer (Subcutaneous Tissue) Exposed: Yes Tendon Exposed: No Muscle Exposed: No Joint Exposed: No Bone Exposed: No Treatment Notes Wound #5 (Toe Great) Wound Laterality: Right Cleanser Soap and Water Discharge Instruction: May shower and wash wound with dial antibacterial soap and water prior to dressing change. Wound Cleanser Discharge Instruction: Cleanse the wound with wound cleanser prior to applying a clean dressing using gauze sponges, not tissue or cotton balls. Peri-Wound Care Topical Compounding T opical Antibiotics Discharge Instruction: Apply T opical Antibiotics from [facility] daily once it arrives, applies over the [place]. Primary Dressing Promogran Prisma Matrix, 4.34 (sq in) (silver collagen) Discharge Instruction: Moisten collagen with saline or hydrogel directly to wound bed. apply over the [place] when arrives. Secondary Dressing Woven Gauze Sponges 2x2  in Discharge Instruction: Apply over primary dressing. may tape down with medipore tape. Secured With Conforming Stretch Gauze Bandage, Sterile 2x75 (in/in) Discharge Instruction: Secure with stretch gauze or use paper tape to secure the gauze. Paper Tape, 2x10 (in/yd) Discharge Instruction: Secure dressing with tape as directed. Compression Wrap Compression Stockings Add-Ons Electronic Signature(s) Signed: 03/21/2022 11:57:18 AM By: Fonnie Mu RN Signed: 03/21/2022 11:57:18 AM By: Fonnie Mu RN Entered By: Fonnie Mu on 03/20/2022 15:22:54 -------------------------------------------------------------------------------- Vitals Details Patient Name: Date of Service: Megan Reap. 03/20/2022 3:00 PM Medical Record Number: [id number] Patient Account Number: [id number] Date of Birth/Sex: Treating RN: Aug 24, 1928 (88 y.o. F) Deaton,  XHBZJ Primary Care Laquinn Shippy: [name] Other Clinician: Referring Briele Lagasse: Treating Davius Goudeau/Extender: [name] in Treatment: 7 Vital Signs Time Taken: 15:12 Temperature (F): 98.3 Height (in): 62 Pulse (bpm): 56 Weight (lbs): 181 Respiratory Rate (breaths/min): 18 Body Mass Index (BMI): 33.1 Blood Pressure (mmHg): 120/65 Reference Range: 80 - 120 mg / dl Electronic Signature(s) Signed: 03/20/2022 4:37:01 PM By: [name] Entered By: [name] on 03/20/2022 15:14:45

## 2022-03-27 ENCOUNTER — Encounter (HOSPITAL_BASED_OUTPATIENT_CLINIC_OR_DEPARTMENT_OTHER): Payer: Medicare HMO | Admitting: Internal Medicine

## 2022-03-27 DIAGNOSIS — M869 Osteomyelitis, unspecified: Secondary | ICD-10-CM | POA: Diagnosis not present

## 2022-03-27 DIAGNOSIS — L97512 Non-pressure chronic ulcer of other part of right foot with fat layer exposed: Secondary | ICD-10-CM

## 2022-03-27 DIAGNOSIS — I890 Lymphedema, not elsewhere classified: Secondary | ICD-10-CM

## 2022-03-27 DIAGNOSIS — I739 Peripheral vascular disease, unspecified: Secondary | ICD-10-CM | POA: Diagnosis not present

## 2022-03-27 NOTE — Progress Notes (Signed)
PAULETTA, [name] ([id number]) Visit Report for 03/27/2022 Arrival Information Details Patient Name: Date of Service: Megan, [name]. 03/27/2022 3:00 PM Medical Record Number: [id number] Patient Account Number: [id number] Date of Birth/Sex: Treating RN: 15-Feb-1928 (88 y.o. [name], [name] Primary Care Yohana Bartha: [name] Other Clinician: Referring Mahati Vajda: Treating Stephaine Breshears/Extender: [name] in Treatment: 8 Visit Information History Since Last Visit Added or deleted any medications: No Patient Arrived: Wheel Chair Any new allergies or adverse reactions: No Arrival Time: 15:30 Had a fall or experienced change in No Accompanied By: daughter activities of daily living that may affect Transfer Assistance: None risk of falls: Patient Identification Verified: Yes Signs or symptoms of abuse/neglect since last visito No Secondary Verification Process Completed: Yes Hospitalized since last visit: No Patient Requires Transmission-Based Precautions: No Implantable device outside of the clinic excluding No Patient Has Alerts: Yes cellular tissue based products placed in the center Patient Alerts: Patient on Blood Thinner since last visit: Has Dressing in Place as Prescribed: Yes Pain Present Now: No Electronic Signature(s) Signed: 03/27/2022 4:23:23 PM By: Thayer Dallas Entered By: Thayer Dallas on 03/27/2022 15:32:34 -------------------------------------------------------------------------------- Clinic Level of Care Assessment Details Patient Name: Date of Service: [name] 03/27/2022 3:00 PM Medical Record Number: [id number] Patient Account Number: [id number] Date of Birth/Sex: Treating RN: [birth year] (88 y.o. [name] Primary Care Danaija Eskridge: [name] Other Clinician: Referring Loye Reininger: Treating Okema Rollinson/Extender: [name] in Treatment: 8 Clinic Level of Care Assessment Items TOOL 4 Quantity  Score X- 1 0 Use when only an EandM is performed on FOLLOW-UP visit ASSESSMENTS - Nursing Assessment / Reassessment X- 1 10 Reassessment of Co-morbidities (includes updates in patient status) X- 1 5 Reassessment of Adherence to Treatment Plan ASSESSMENTS - Wound and Skin A ssessment / Reassessment []  - 0 Simple Wound Assessment / Reassessment - one wound X- 2 5 Complex Wound Assessment / Reassessment - multiple wounds X- 1 10 Dermatologic / Skin Assessment (not related to wound area) ASSESSMENTS - Focused Assessment X- 1 5 Circumferential Edema Measurements - multi extremities X- 1 10 Nutritional Assessment / Counseling / Intervention []  - 0 Lower Extremity Assessment (monofilament, tuning fork, pulses) []  - 0 Peripheral Arterial Disease Assessment (using hand held doppler) ASSESSMENTS - Ostomy and/or Continence Assessment and Care []  - 0 Incontinence Assessment and Management []  - 0 Ostomy Care Assessment and Management (repouching, etc.) PROCESS - Coordination of Care []  - 0 Simple Patient / Family Education for ongoing care X- 1 20 Complex (extensive) Patient / Family Education for ongoing care X- 1 10 Staff obtains , Records, T Results / Process Orders est X- 1 10 Staff telephones HHA, Nursing Homes / Clarify orders / etc []  - 0 Routine Transfer to another Facility (non-emergent condition) []  - 0 Routine Hospital Admission (non-emergent condition) []  - 0 New Admissions / / Ordering NPWT Apligraf, etc. , []  - 0 Emergency Hospital Admission (emergent condition) []  - 0 Simple Discharge Coordination X- 1 15 Complex (extensive) Discharge Coordination PROCESS - Special Needs []  - 0 Pediatric / Minor Patient Management []  - 0 Isolation Patient Management []  - 0 Hearing / Language / Visual special needs []  - 0 Assessment of Community assistance (transportation, D/C planning, etc.) []  - 0 Additional assistance / Altered  mentation []  - 0 Support Surface(s) Assessment (bed, cushion, seat, etc.) INTERVENTIONS - Wound Cleansing / Measurement []  - 0 Simple Wound Cleansing - one wound X- 2 5 Complex Wound Cleansing -  multiple wounds X- 1 5 Wound Imaging (photographs - any number of wounds) []  - 0 Wound Tracing (instead of photographs) []  - 0 Simple Wound Measurement - one wound X- 2 5 Complex Wound Measurement - multiple wounds INTERVENTIONS - Wound Dressings X - Small Wound Dressing one or multiple wounds 2 10 []  - 0 Medium Wound Dressing one or multiple wounds []  - 0 Large Wound Dressing one or multiple wounds []  - 0 Application of Medications - topical []  - 0 Application of Medications - injection INTERVENTIONS - Miscellaneous []  - 0 External ear exam []  - 0 Specimen Collection (cultures, biopsies, blood, body fluids, etc.) []  - 0 Specimen(s) / Culture(s) sent or taken to Lab for analysis []  - 0 Patient Transfer (multiple staff / / Similar devices) []  - 0 Simple Staple / Suture removal (25 or less) []  - 0 Complex Staple / Suture removal (26 or more) []  - 0 Hypo / Hyperglycemic Management (close monitor of Blood Glucose) []  - 0 Ankle / Brachial Index (ABI) - do not check if billed separately X- 1 5 Vital Signs Has the patient been seen at the hospital within the last three years: Yes Total Score: 155 Level Of Care: New/Established - Level 4 Electronic Signature(s) Signed: 03/27/2022 5:46:02 PM By: RN, BSN Entered By: on 03/27/2022 16:02:01 -------------------------------------------------------------------------------- Encounter Discharge Information Details Patient Name: Date of Service: . 03/27/2022 3:00 PM Medical Record Number: Patient Account Number: Date of Birth/Sex: Treating RN: Dec 14, 1927 (88 y.o. Primary Care Quayshaun Hubbert: Other Clinician: Referring Rindy Kollman: Treating  Neel Buffone/Extender: in Treatment: 8 Encounter Discharge Information Items Discharge Condition: Stable Ambulatory Status: Wheelchair Discharge Destination: Home Transportation: Private Auto Accompanied By: daughter Schedule Follow-up Appointment: Yes Clinical Summary of Care: Electronic Signature(s) Signed: 03/27/2022 5:46:02 PM By: 03/29/2022 RN, BSN Entered By: Shawn Stall on 03/27/2022 16:02:35 -------------------------------------------------------------------------------- Lower Extremity Assessment Details Patient Name: Date of Service: 03/29/2022. 03/27/2022 3:00 PM Medical Record Number: 03/29/2022 Patient Account Number: [id number] Date of Birth/Sex: Treating RN: May 25, 1928 (88 y.o. 99, [name] Primary Care Katrese Shell: [name] Other Clinician: Referring Kalyani Maeda: Treating Caeleigh Prohaska/Extender: [name] Weeks in Treatment: 8 Edema Assessment Assessed: [Left: No] [Right: No] Edema: [Left: Yes] [Right: Yes] Calf Left: Right: Point of Measurement: 30 cm From Medial Instep 43.5 cm Ankle Left: Right: Point of Measurement: 9 cm From Medial Instep 23.6 cm Electronic Signature(s) Signed: 03/27/2022 4:23:23 PM By: Shawn Stall Signed: 03/27/2022 5:46:02 PM By: 03/29/2022 RN, BSN Entered By: [name] on 03/27/2022 15:43:22 -------------------------------------------------------------------------------- Multi Wound Chart Details Patient Name: Date of Service: [id number]. 03/27/2022 3:00 PM Medical Record Number: 05/11/1928 Patient Account Number: [id number] Date of Birth/Sex: Treating RN: 1928-08-03 (88 y.o. [name], [name] Primary Care Schon Zeiders: 03/29/2022 Other Clinician: Referring Chaska Hagger: Treating Tomy Khim/Extender: Thayer Dallas in Treatment: 8 Vital Signs Height(in): 62 Pulse(bpm): 60 Weight(lbs): 181 Blood Pressure(mmHg): 111/66 Body Mass  Index(BMI): 33.1 Temperature(F): 98.3 Respiratory Rate(breaths/min): 18 Photos: [N/A:N/A] Right Amputation Site - Toe Right T Great oe N/A Wound Location: Surgical Injury Shear/Friction N/A Wounding Event: Open Surgical Wound Abrasion N/A Primary Etiology: Cataracts, Anemia, Congestive Heart Cataracts, Anemia, Congestive Heart N/A Comorbid History: Failure, Hypotension, Peripheral Failure, Hypotension, Peripheral Venous Disease, Osteomyelitis, Venous Disease, Osteomyelitis, Neuropathy Neuropathy 11/17/2021 02/06/2022 N/A Date Acquired: 8 6 N/A Weeks of Treatment: Open Open N/A Wound Status: No No N/A Wound Recurrence:  0.6x0.3x0.6 0.4x0.3x0.1 N/A Measurements L x W x D (cm) 0.141 0.094 N/A A (cm) : rea 0.085 0.009 N/A Volume (cm) : 67.40% 75.60% N/A % Reduction in Area: 60.60% 76.30% N/A % Reduction in Volume: Full Thickness Without Exposed Full Thickness Without Exposed N/A Classification: Support Structures Support Structures Medium Medium N/A Exudate Amount: Serosanguineous Serosanguineous N/A Exudate Type: red, brown red, brown N/A Exudate Color: Epibole Distinct, outline attached N/A Wound Margin: Small (1-33%) Small (1-33%) N/A Granulation Amount: Red Pink, Pale N/A Granulation Quality: Large (67-100%) Large (67-100%) N/A Necrotic Amount: Fat Layer (Subcutaneous Tissue): Yes Fat Layer (Subcutaneous Tissue): Yes N/A Exposed Structures: Fascia: No Fascia: No Tendon: No Tendon: No Muscle: No Muscle: No Joint: No Joint: No Bone: No Bone: No Small (1-33%) Small (1-33%) N/A Epithelialization: Treatment Notes Wound #3 (Amputation Site - Toe) Wound Laterality: Right Cleanser Soap and Water Discharge Instruction: May shower and wash wound with dial antibacterial soap and water prior to dressing change. Wound Cleanser Discharge Instruction: Cleanse the wound with wound cleanser prior to applying a clean dressing using gauze sponges, not tissue or  cotton balls. Peri-Wound Care Topical Compounding T opical Antibiotics Discharge Instruction: Apply T opical Antibiotics from [facility] daily once it arrives, applies over the [place]. Primary Dressing Promogran Prisma Matrix, 4.34 (sq in) (silver collagen) Discharge Instruction: Moisten collagen with saline or hydrogel directly to wound bed. apply over the [place] when arrives. Secondary Dressing Woven Gauze Sponges 2x2 in Discharge Instruction: Apply over primary dressing. ***Apply a rolled gauze under the right great toe. Secured With Conforming Stretch Gauze Bandage, Sterile 2x75 (in/in) Discharge Instruction: Secure with stretch gauze or use paper tape to secure the gauze. Paper Tape, 2x10 (in/yd) Discharge Instruction: Secure dressing with tape as directed. Compression Wrap Compression Stockings Add-Ons Wound #5 (Toe Great) Wound Laterality: Right Cleanser Soap and Water Discharge Instruction: May shower and wash wound with dial antibacterial soap and water prior to dressing change. Wound Cleanser Discharge Instruction: Cleanse the wound with wound cleanser prior to applying a clean dressing using gauze sponges, not tissue or cotton balls. Peri-Wound Care Topical Compounding T opical Antibiotics Discharge Instruction: Apply T opical Antibiotics from [facility] daily once it arrives, applies over the [employer]. Primary Dressing Promogran Prisma Matrix, 4.34 (sq in) (silver collagen) Discharge Instruction: Moisten collagen with saline or hydrogel directly to wound bed. apply over the [place] when arrives. Secondary Dressing Woven Gauze Sponges 2x2 in Discharge Instruction: Apply over primary dressing. may tape down with medipore tape. Secured With Conforming Stretch Gauze Bandage, Sterile 2x75 (in/in) Discharge Instruction: Secure with stretch gauze or use paper tape to secure the gauze. Paper Tape, 2x10 (in/yd) Discharge Instruction: Secure dressing with tape  as directed. Compression Wrap Compression Stockings Add-Ons Electronic Signature(s) Signed: 03/27/2022 4:23:48 PM By: Geralyn Corwin DO Signed: 03/27/2022 5:46:02 PM By: Shawn Stall RN, BSN Entered By: Geralyn Corwin on 03/27/2022 16:09:30 -------------------------------------------------------------------------------- Multi-Disciplinary Care Plan Details Patient Name: Date of Service: [name] 03/27/2022 3:00 PM Medical Record Number: [id number] Patient Account Number: [id number] Date of Birth/Sex: Treating RN: 05/31/1928 (88 y.o. [name], [name] Primary Care Lilyanna Lunt: [name] Other Clinician: Referring Jana Swartzlander: Treating Willliam Pettet/Extender: [name] in Treatment: 8 Active Inactive Nutrition Nursing Diagnoses: Potential for alteratiion in Nutrition/Potential for imbalanced nutrition Goals: Patient/caregiver agrees to and verbalizes understanding of need to use nutritional supplements and/or vitamins as prescribed Date Initiated: 01/28/2022 Target Resolution Date: 04/04/2022 Goal Status: Active Interventions: Assess patient nutrition upon admission and as needed per  policy Provide education on nutrition Treatment Activities: Patient referred to Primary Care Physician for further nutritional evaluation : 01/28/2022 Notes: Pain, Acute or Chronic Nursing Diagnoses: Pain, acute or chronic: actual or potential Potential alteration in comfort, pain Goals: Patient will verbalize adequate pain control and receive pain control interventions during procedures as needed Date Initiated: 01/28/2022 Target Resolution Date: 04/04/2022 Goal Status: Active Interventions: Encourage patient to take pain medications as prescribed Provide education on pain management Treatment Activities: Administer pain control measures as ordered : 01/28/2022 Notes: Wound/Skin Impairment Nursing Diagnoses: Knowledge deficit related to ulceration/compromised  skin integrity Goals: Patient/caregiver will verbalize understanding of skin care regimen Date Initiated: 01/28/2022 Target Resolution Date: 04/04/2022 Goal Status: Active Interventions: Assess patient/caregiver ability to perform ulcer/skin care regimen upon admission and as needed Assess ulceration(s) every visit Provide education on ulcer and skin care Treatment Activities: Skin care regimen initiated : 01/28/2022 Topical wound management initiated : 01/28/2022 Notes: Electronic Signature(s) Signed: 03/27/2022 5:46:02 PM By: Shawn Stalleaton, Bobbi RN, BSN Entered By: Shawn Stalleaton, Bobbi on 03/27/2022 15:58:37 -------------------------------------------------------------------------------- Pain Assessment Details Patient Name: Date of Service: [name], [name] 03/27/2022 3:00 PM Medical Record Number: [id number] Patient Account Number: [id number] Date of Birth/Sex: Treating RN: 05/05/1928 (88 y.o. Megan PickettF) Deaton, Megan KendallBobbi Primary Care Evellyn Tuff: Mila PalmerWolters, Sharon Other Clinician: Referring Yurika Pereda: Treating Valor [place]/Extender: Burman RiisHoffman, Jessica Wolters, Sharon Weeks in Treatment: 8 Active Problems Location of Pain Severity and Description of Pain Patient Has Paino No Site Locations Pain Management and Medication Current Pain Management: Electronic Signature(s) Signed: 03/27/2022 4:23:23 PM By: Thayer Dallasick, Kimberly Signed: 03/27/2022 5:46:02 PM By: Shawn Stalleaton, Bobbi RN, BSN Entered By: Thayer Dallasick, Kimberly on 03/27/2022 15:34:41 -------------------------------------------------------------------------------- Patient/Caregiver Education Details Patient Name: Date of Service: [name], [name] 7/20/2023andnbsp3:00 PM Medical Record Number: [id number] Patient Account Number: [id number] Date of Birth/Gender: Treating RN: 03/06/1928 (88 y.o. [name]) Deaton, Bobbi Primary Care Physician: Mila PalmerWolters, Sharon Other Clinician: Referring Physician: Treating Physician/Extender: Burman RiisHoffman, Jessica Wolters, Sharon Weeks in Treatment:  8 Education Assessment Education Provided To: Patient Education Topics Provided Wound/Skin Impairment: Handouts: Skin Care Do's and Dont's Methods: Explain/Verbal Responses: Reinforcements needed Electronic Signature(s) Signed: 03/27/2022 5:46:02 PM By: Shawn Stalleaton, Bobbi RN, BSN Entered By: Shawn Stalleaton, Bobbi on 03/27/2022 15:58:47 -------------------------------------------------------------------------------- Wound Assessment Details Patient Name: Date of Service: [name], [name] 03/27/2022 3:00 PM Medical Record Number: [id number] Patient Account Number: [id number] Date of Birth/Sex: Treating RN: 01/23/1928 (88 y.o. Megan PickettF) Deaton, Millard.LoaBobbi Primary Care Rayvon Brandvold: Mila PalmerWolters, Sharon Other Clinician: Referring [place] Lemaster: Treating Chelly Dombeck/Extender: Sallee ProvencalHoffman, Jessica Wolters, Sharon Weeks in Treatment: 8 Wound Status Wound Number: 3 Primary Open Surgical Wound Etiology: Wound Location: Right Amputation Site - Toe Wound Open Wounding Event: Surgical Injury Status: Date Acquired: 11/17/2021 Comorbid Cataracts, Anemia, Congestive Heart Failure, Hypotension, Weeks Of Treatment: 8 History: Peripheral Venous Disease, Osteomyelitis, Neuropathy Clustered Wound: No Photos Wound Measurements Length: (cm) 0.6 Width: (cm) 0.3 Depth: (cm) 0.6 Area: (cm) 0.141 Volume: (cm) 0.085 % Reduction in Area: 67.4% % Reduction in Volume: 60.6% Epithelialization: Small (1-33%) Tunneling: No Undermining: No Wound Description Classification: Full Thickness Without Exposed Support Structures Wound Margin: Epibole Exudate Amount: Medium Exudate Type: Serosanguineous Exudate Color: red, brown Foul Odor After Cleansing: No Slough/Fibrino Yes Wound Bed Granulation Amount: Small (1-33%) Exposed Structure Granulation Quality: Red Fascia Exposed: No Necrotic Amount: Large (67-100%) Fat Layer (Subcutaneous Tissue) Exposed: Yes Necrotic Quality: Adherent Slough Tendon Exposed: No Muscle Exposed: No Joint  Exposed: No Bone Exposed: No Treatment Notes Wound #3 (Amputation Site - Toe) Wound Laterality: Right Cleanser Soap and Water Discharge Instruction: May shower and  wash wound with dial antibacterial soap and water prior to dressing change. Wound Cleanser Discharge Instruction: Cleanse the wound with wound cleanser prior to applying a clean dressing using gauze sponges, not tissue or cotton balls. Peri-Wound Care Topical Compounding T opical Antibiotics Discharge Instruction: Apply T opical Antibiotics from [facility] daily once it arrives, applies over the [place]. Primary Dressing Promogran Prisma Matrix, 4.34 (sq in) (silver collagen) Discharge Instruction: Moisten collagen with saline or hydrogel directly to wound bed. apply over the [place] when arrives. Secondary Dressing Woven Gauze Sponges 2x2 in Discharge Instruction: Apply over primary dressing. ***Apply a rolled gauze under the right great toe. Secured With Conforming Stretch Gauze Bandage, Sterile 2x75 (in/in) Discharge Instruction: Secure with stretch gauze or use paper tape to secure the gauze. Paper Tape, 2x10 (in/yd) Discharge Instruction: Secure dressing with tape as directed. Compression Wrap Compression Stockings Add-Ons Electronic Signature(s) Signed: 03/27/2022 4:23:23 PM By: Thayer Dallas Signed: 03/27/2022 5:46:02 PM By: Shawn Stall RN, BSN Entered By: Thayer Dallas on 03/27/2022 15:46:35 -------------------------------------------------------------------------------- Wound Assessment Details Patient Name: Date of Service: [name] 03/27/2022 3:00 PM Medical Record Number: [id number] Patient Account Number: [id number] Date of Birth/Sex: Treating RN: 01-03-1928 (88 y.o. [name] Primary Care Lyndon Chapel: [name] Other Clinician: Referring Jahson Emanuele: Treating [name] Biehn/Extender: [name] Weeks in Treatment: 8 Wound Status Wound Number: 5 Primary  Abrasion Etiology: Wound Location: Right T Great oe Wound Open Wounding Event: Shear/Friction Status: Date Acquired: 02/06/2022 Comorbid Cataracts, Anemia, Congestive Heart Failure, Hypotension, Weeks Of Treatment: 6 History: Peripheral Venous Disease, Osteomyelitis, Neuropathy Clustered Wound: No Photos Wound Measurements Length: (cm) 0.4 Width: (cm) 0.3 Depth: (cm) 0.1 Area: (cm) 0.094 Volume: (cm) 0.009 % Reduction in Area: 75.6% % Reduction in Volume: 76.3% Epithelialization: Small (1-33%) Tunneling: No Undermining: No Wound Description Classification: Full Thickness Without Exposed Support Structures Wound Margin: Distinct, outline attached Exudate Amount: Medium Exudate Type: Serosanguineous Exudate Color: red, brown Foul Odor After Cleansing: No Slough/Fibrino Yes Wound Bed Granulation Amount: Small (1-33%) Exposed Structure Granulation Quality: Pink, Pale Fascia Exposed: No Necrotic Amount: Large (67-100%) Fat Layer (Subcutaneous Tissue) Exposed: Yes Tendon Exposed: No Muscle Exposed: No Joint Exposed: No Bone Exposed: No Treatment Notes Wound #5 (Toe Great) Wound Laterality: Right Cleanser Soap and Water Discharge Instruction: May shower and wash wound with dial antibacterial soap and water prior to dressing change. Wound Cleanser Discharge Instruction: Cleanse the wound with wound cleanser prior to applying a clean dressing using gauze sponges, not tissue or cotton balls. Peri-Wound Care Topical Compounding T opical Antibiotics Discharge Instruction: Apply T opical Antibiotics from [facility] daily once it arrives, applies over the [place]. Primary Dressing Promogran Prisma Matrix, 4.34 (sq in) (silver collagen) Discharge Instruction: Moisten collagen with saline or hydrogel directly to wound bed. apply over the [place] when arrives. Secondary Dressing Woven Gauze Sponges 2x2 in Discharge Instruction: Apply over primary dressing. may tape  down with medipore tape. Secured With Conforming Stretch Gauze Bandage, Sterile 2x75 (in/in) Discharge Instruction: Secure with stretch gauze or use paper tape to secure the gauze. Paper Tape, 2x10 (in/yd) Discharge Instruction: Secure dressing with tape as directed. Compression Wrap Compression Stockings Add-Ons Electronic Signature(s) Signed: 03/27/2022 4:23:23 PM By: Thayer Dallas Signed: 03/27/2022 5:46:02 PM By: Shawn Stall RN, BSN Entered By: Thayer Dallas on 03/27/2022 15:47:24 -------------------------------------------------------------------------------- Vitals Details Patient Name: Date of Service: [name] 03/27/2022 3:00 PM Medical Record Number: [id number] Patient Account Number: [id number] Date of Birth/Sex: Treating RN: 1928/01/05 (88 y.o. F) Deaton,  ZOXWR Primary Care Mickie Badders: [name] Other Clinician: Referring Veleria Barnhardt: Treating Lamberto Dinapoli/Extender: [name] in Treatment: 8 Vital Signs Time Taken: 15:32 Temperature (F): 98.3 Height (in): 62 Pulse (bpm): 60 Weight (lbs): 181 Respiratory Rate (breaths/min): 18 Body Mass Index (BMI): 33.1 Blood Pressure (mmHg): 111/66 Reference Range: 80 - 120 mg / dl Electronic Signature(s) Signed: 03/27/2022 4:23:23 PM By: Thayer Dallas Entered By: Thayer Dallas on 03/27/2022 15:34:27

## 2022-03-27 NOTE — Progress Notes (Signed)
KELANI, ROBART ([id number]) Visit Report for 03/27/2022 Chief Complaint Document Details Patient Name: Date of Service: [name], [name]. 03/27/2022 3:00 PM Medical Record Number: [id number] Patient Account Number: [id number] Date of Birth/Sex: Treating RN: 09/02/1928 (88 y.o. [name] Primary Care Provider: [name] Other Clinician: Referring Provider: Treating Provider/Extender: [name] in Treatment: 8 Information Obtained from: Patient Chief Complaint 01/28/2022; right second toe amputation site dehiscence and bilateral lower extremity wounds. Electronic Signature(s) Signed: 03/27/2022 4:23:48 PM By: Geralyn Corwin DO Entered By: Geralyn Corwin on 03/27/2022 16:09:42 -------------------------------------------------------------------------------- HPI Details Patient Name: Date of Service: Ethelda Chick TTIE M. 03/27/2022 3:00 PM Medical Record Number: [id number] Patient Account Number: [id number] Date of Birth/Sex: Treating RN: 1928/04/12 (88 y.o. [name] Primary Care Provider: [name] Other Clinician: Referring Provider: Treating Provider/Extender: [name] in Treatment: 8 History of Present Illness HPI Description: Admission 01/28/2022 Ms. Janeice Stegall is a 88 year old female with a past medical history of idiopathic peripheral neuropathy status post amputation to the second right toe secondary to osteomyelitis, COPD and A-fib on Coumadin the presents to the clinic for a 87-month history of nonhealing ulcer to a previous amputation site on the second right toe. She states she has tried Medihoney and silver alginate in the past to this area with little benefit. She also has 2 small areas limited to skin breakdown to her lower extremities bilaterally. She has chronic venous insufficiency but not has not been wearing her compression stockings. She states she bumped her legs against an object and not so  the wound started. She has been using Medihoney to the sites. She denies signs of infection. 6/2; patient presents for follow-up. She had an x-ray of her right foot done at last clinic visit and this was negative for evidence of osteomyelitis. She also had a wound culture done that showed extra high levels of Staph aureus. I recommended Keystone antibiotics for this and this was ordered. She had ABIs with TBI's done as well that showed monophasic waveforms to the right foot with TBI of 0 and an ABI of 0.52. Urgent referral was made to vein and vascular and she saw Dr. Durwin Nora on 6/1, yesterday and he recommended an arteriogram. This is scheduled for 6/16. Patient also reports a new wound to the right great toe. This is a blister that has ruptured. She also reports increased erythema to the toe. 6/6; the patient was worked in urgently today at the insistence of her daughter out of concern for a new wound on the lateral part of the plantar right great toe. She has her original postsurgical wound after the amputation of the right second toe, she has a wound on the medial part of the right great toe. The patient is apparently going for an angiogram by Dr. Durwin Nora in 2 weeks time. 6/13; patient presents for follow-up. She has been using bacitracin to the abrasion on the right great toe. She has been using collagen and Keystone antibiotic to the amputation site. She has no issues or complaints today. She denies signs of infection. 6/22; patient presents for follow-up. She states that her abdominal aortogram was canceled due to her renal function. She has been using Keystone antibiotics to the amputation site and Medihoney to the right great toe wound. At the pace of the right great toe she has a slitlike open area that she thinks was caused by the tape from the dressing. 6/29; patient presents for follow-up. She has been  using Keystone antibiotics to the amputation site along with collagen. She has been using  Medihoney to the right great toe wound. She has no other wounds. She denies signs of infection. 7/13; patient presents for follow-up. She has been using Keystone antibiotics and collagen to the wound sites. She currently denies signs of infection. She states she is scheduled to see me nephrology next month. 7/20; patient presents for follow-up. She continue Keystone antibiotics and collagen to the wound sites. She has no issues or complaints today. Electronic Signature(s) Signed: 03/27/2022 4:23:48 PM By: Geralyn Corwin DO Entered By: Geralyn Corwin on 03/27/2022 16:10:32 -------------------------------------------------------------------------------- Physical Exam Details Patient Name: Date of Service: [name] 03/27/2022 3:00 PM Medical Record Number: [id number] Patient Account Number: [id number] Date of Birth/Sex: Treating RN: [birth year] (88 y.o. [name] Primary Care Provider: [name] Other Clinician: Referring Provider: Treating Provider/Extender: [name] in Treatment: 8 Constitutional respirations regular, non-labored and within target range for patient.[name] Kitchen Psychiatric pleasant and cooperative. Notes Right foot: T the second digit there is an amputation site that is open with pale granulation tissue. Does not probe to bone. No surrounding signs of infection. o T the lateral aspect of the great toe there is an open wound with pale granulation tissue. No surrounding signs of infection to either wound bed. o Electronic Signature(s) Signed: 03/27/2022 4:23:48 PM By: Geralyn Corwin DO Entered By: Geralyn Corwin on 03/27/2022 16:11:37 -------------------------------------------------------------------------------- Physician Orders Details Patient Name: Date of Service: [name] 03/27/2022 3:00 PM Medical Record Number: [id number] Patient Account Number: [id number] Date of Birth/Sex: Treating RN: 1927/10/26 (88 y.o. [name], [name] Primary Care Provider: [name] Other Clinician: Referring Provider: Treating Provider/Extender: [name] in Treatment: 8 Verbal / Phone Orders: No Diagnosis Coding ICD-10 Coding Code Description [phone] Non-pressure chronic ulcer of other part of right foot with fat layer exposed L97.822 Non-pressure chronic ulcer of other part of left lower leg with fat layer exposed G90.09 Other idiopathic peripheral autonomic neuropathy M86.9 Osteomyelitis, unspecified I48.91 Unspecified atrial fibrillation Z79.01 Long term (current) use of anticoagulants I89.0 Lymphedema, not elsewhere classified I87.2 Venous insufficiency (chronic) (peripheral) J44.9 Chronic obstructive pulmonary disease, unspecified Follow-up Appointments ppointment in 2 weeks. - Dr. Mikey Bussing and [place], Room 8 04/08/2022 3pm Tuesday Return A Other: - [facility] continue to use when it arrives under the Prisma to right amputation toe site.*** bactracin under the right great toe for as needed protection. 04/17/2022 [place] [facility] Edema Control - Lymphedema / SCD / Other Elevate legs to the level of the heart or above for 30 minutes daily and/or when sitting, a frequency of: - throughout the day. Avoid standing for long periods of time. Home Health No change in wound care orders this week; continue Home Health for wound care. May utilize formulary equivalent dressing for wound treatment orders unless otherwise specified. - Home health weekly dressing changes. All other days daughter to change. Bacitracin ointment under right great toe as needed. all wounds keystone topical antibiotics and prisma. Other Home Health Orders/Instructions: - [facility]. Wound Treatment Wound #3 - Amputation Site - Toe Wound Laterality: Right Cleanser: Soap and Water (Home Health) 1 x Per Day/30 Days Discharge Instructions: May shower and wash wound with dial antibacterial soap and  water prior to dressing change. Cleanser: Wound Cleanser (Home Health) 1 x Per Day/30 Days Discharge Instructions: Cleanse the wound with wound cleanser prior to applying a clean dressing using gauze sponges, not tissue or  cotton balls. Topical: Compounding T opical Antibiotics (Home Health) 1 x Per Day/30 Days Discharge Instructions: Apply T opical Antibiotics from [facility] daily once it arrives, applies over the [place]. Prim Dressing: Promogran Prisma Matrix, 4.34 (sq in) (silver collagen) (Home Health) 1 x Per Day/30 Days ary Discharge Instructions: Moisten collagen with saline or hydrogel directly to wound bed. apply over the [place] when arrives. Secondary Dressing: Woven Gauze Sponges 2x2 in (Home Health) 1 x Per Day/30 Days Discharge Instructions: Apply over primary dressing. ***Apply a rolled gauze under the right great toe. Secured With: Insurance underwriter, Sterile 2x75 (in/in) (Home Health) 1 x Per Day/30 Days Discharge Instructions: Secure with stretch gauze or use paper tape to secure the gauze. Secured With: Paper Tape, 2x10 (in/yd) (Home Health) 1 x Per Day/30 Days Discharge Instructions: Secure dressing with tape as directed. Wound #5 - T Great oe Wound Laterality: Right Cleanser: Soap and Water (Home Health) 1 x Per Day/30 Days Discharge Instructions: May shower and wash wound with dial antibacterial soap and water prior to dressing change. Cleanser: Wound Cleanser (Home Health) 1 x Per Day/30 Days Discharge Instructions: Cleanse the wound with wound cleanser prior to applying a clean dressing using gauze sponges, not tissue or cotton balls. Topical: Compounding T opical Antibiotics (Home Health) 1 x Per Day/30 Days Discharge Instructions: Apply T opical Antibiotics from [facility] daily once it arrives, applies over the [place]. Prim Dressing: Promogran Prisma Matrix, 4.34 (sq in) (silver collagen) (Home Health) 1 x Per Day/30 Days ary Discharge  Instructions: Moisten collagen with saline or hydrogel directly to wound bed. apply over the [place] when arrives. Secondary Dressing: Woven Gauze Sponges 2x2 in (Home Health) 1 x Per Day/30 Days Discharge Instructions: Apply over primary dressing. may tape down with medipore tape. Secured With: Insurance underwriter, Sterile 2x75 (in/in) (Home Health) 1 x Per Day/30 Days Discharge Instructions: Secure with stretch gauze or use paper tape to secure the gauze. Secured With: Paper Tape, 2x10 (in/yd) (Home Health) 1 x Per Day/30 Days Discharge Instructions: Secure dressing with tape as directed. Electronic Signature(s) Signed: 03/27/2022 4:23:48 PM By: Geralyn Corwin DO Entered By: Geralyn Corwin on 03/27/2022 16:11:46 -------------------------------------------------------------------------------- Problem List Details Patient Name: Date of Service: [name] 03/27/2022 3:00 PM Medical Record Number: [id number] Patient Account Number: [id number] Date of Birth/Sex: Treating RN: Feb 01, 1928 (88 y.o. [name], [name] Primary Care Provider: [name] Other Clinician: Referring Provider: Treating Provider/Extender: [name] in Treatment: 8 Active Problems ICD-10 Encounter Code Description Active Date MDM Diagnosis [phone] Non-pressure chronic ulcer of other part of right foot with fat layer exposed 01/28/2022 No Yes L97.822 Non-pressure chronic ulcer of other part of left lower leg with fat layer exposed5/23/2023 No Yes G90.09 Other idiopathic peripheral autonomic neuropathy 01/28/2022 No Yes M86.9 Osteomyelitis, unspecified 01/28/2022 No Yes I48.91 Unspecified atrial fibrillation 01/28/2022 No Yes Z79.01 Long term (current) use of anticoagulants 01/28/2022 No Yes I89.0 Lymphedema, not elsewhere classified 01/28/2022 No Yes I87.2 Venous insufficiency (chronic) (peripheral) 01/28/2022 No Yes J44.9 Chronic obstructive pulmonary disease,  unspecified 01/28/2022 No Yes I73.9 Peripheral vascular disease, unspecified 03/27/2022 No Yes Inactive Problems ICD-10 Code Description Active Date Inactive Date L97.812 Non-pressure chronic ulcer of other part of right lower leg with fat layer exposed 01/28/2022 01/28/2022 Resolved Problems Electronic Signature(s) Signed: 03/27/2022 4:23:48 PM By: Geralyn Corwin DO Entered By: Geralyn Corwin on 03/27/2022 16:14:29 -------------------------------------------------------------------------------- Progress Note Details Patient Name: Date of Service: [place], [name] 03/27/2022 3:00  PM Medical Record Number: [id number] Patient Account Number: [id number] Date of Birth/Sex: Treating RN: 1928/03/27 (88 y.o. [name] Primary Care Provider: [name] Other Clinician: Referring Provider: Treating Provider/Extender: [name] in Treatment: 8 Subjective Chief Complaint Information obtained from Patient 01/28/2022; right second toe amputation site dehiscence and bilateral lower extremity wounds. History of Present Illness (HPI) Admission 01/28/2022 Ms. Lavene Penagos is a 88 year old female with a past medical history of idiopathic peripheral neuropathy status post amputation to the second right toe secondary to osteomyelitis, COPD and A-fib on Coumadin the presents to the clinic for a 41-month history of nonhealing ulcer to a previous amputation site on the second right toe. She states she has tried Medihoney and silver alginate in the past to this area with little benefit. She also has 2 small areas limited to skin breakdown to her lower extremities bilaterally. She has chronic venous insufficiency but not has not been wearing her compression stockings. She states she bumped her legs against an object and not so the wound started. She has been using Medihoney to the sites. She denies signs of infection. 6/2; patient presents for follow-up. She had an x-ray of  her right foot done at last clinic visit and this was negative for evidence of osteomyelitis. She also had a wound culture done that showed extra high levels of Staph aureus. I recommended Keystone antibiotics for this and this was ordered. She had ABIs with TBI's done as well that showed monophasic waveforms to the right foot with TBI of 0 and an ABI of 0.52. Urgent referral was made to vein and vascular and she saw Dr. Durwin Nora on 6/1, yesterday and he recommended an arteriogram. This is scheduled for 6/16. Patient also reports a new wound to the right great toe. This is a blister that has ruptured. She also reports increased erythema to the toe. 6/6; the patient was worked in urgently today at the insistence of her daughter out of concern for a new wound on the lateral part of the plantar right great toe. She has her original postsurgical wound after the amputation of the right second toe, she has a wound on the medial part of the right great toe. The patient is apparently going for an angiogram by Dr. Durwin Nora in 2 weeks time. 6/13; patient presents for follow-up. She has been using bacitracin to the abrasion on the right great toe. She has been using collagen and Keystone antibiotic to the amputation site. She has no issues or complaints today. She denies signs of infection. 6/22; patient presents for follow-up. She states that her abdominal aortogram was canceled due to her renal function. She has been using Keystone antibiotics to the amputation site and Medihoney to the right great toe wound. At the pace of the right great toe she has a slitlike open area that she thinks was caused by the tape from the dressing. 6/29; patient presents for follow-up. She has been using Keystone antibiotics to the amputation site along with collagen. She has been using Medihoney to the right great toe wound. She has no other wounds. She denies signs of infection. 7/13; patient presents for follow-up. She has been  using Keystone antibiotics and collagen to the wound sites. She currently denies signs of infection. She states she is scheduled to see me nephrology next month. 7/20; patient presents for follow-up. She continue Keystone antibiotics and collagen to the wound sites. She has no issues or complaints today. Patient History Information obtained from  Patient, Caregiver. Family History Cancer - Siblings, Heart Disease, Stroke - Mother, No family history of Diabetes. Social History Former smoker - quit 2003, Marital Status - Widowed, Alcohol Use - Never, Drug Use - No History, Caffeine Use - Never. Medical History Eyes Patient has history of Cataracts Hematologic/Lymphatic Patient has history of Anemia Respiratory Denies history of Chronic Obstructive Pulmonary Disease (COPD) Cardiovascular Patient has history of Congestive Heart Failure, Hypotension, Peripheral Venous Disease Musculoskeletal Patient has history of 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 Neurologic Patient has history of Neuropathy Medical A Surgical History Notes nd Hematologic/Lymphatic Thrombocytopenia Endocrine Hyperthyroidism, Hypothyroidism Objective Constitutional respirations regular, non-labored and within target range for patient.. Vitals Time Taken: 3:32 PM, Height: 62 in, Weight: 181 lbs, BMI: 33.1, Temperature: 98.3 F, Pulse: 60 bpm, Respiratory Rate: 18 breaths/min, Blood Pressure: 111/66 mmHg. Psychiatric pleasant and cooperative. General Notes: Right foot: T the second digit there is an amputation site that is open with pale granulation tissue. Does not probe to bone. No surrounding o signs of infection. T the lateral aspect of the great toe there is an open wound with pale granulation tissue. No surrounding signs of infection to either wound o bed. Integumentary (Hair, Skin) Wound #3 status is Open. Original cause of wound was Surgical Injury. The date acquired was: 11/17/2021. The wound  has been in treatment 8 weeks. The wound is located on the Right Amputation Site - T The wound measures 0.6cm length x 0.3cm width x 0.6cm depth; 0.141cm^2 area and 0.085cm^3 volume. oe. There is Fat Layer (Subcutaneous Tissue) exposed. There is no tunneling or undermining noted. There is a medium amount of serosanguineous drainage noted. The wound margin is epibole. There is small (1-33%) red granulation within the wound bed. There is a large (67-100%) amount of necrotic tissue within the wound bed including Adherent Slough. Wound #5 status is Open. Original cause of wound was Shear/Friction. The date acquired was: 02/06/2022. The wound has been in treatment 6 weeks. The wound is located on the Right T Great. The wound measures 0.4cm length x 0.3cm width x 0.1cm depth; 0.094cm^2 area and 0.009cm^3 volume. There is Fat Layer oe (Subcutaneous Tissue) exposed. There is no tunneling or undermining noted. There is a medium amount of serosanguineous drainage noted. The wound margin is distinct with the outline attached to the wound base. There is small (1-33%) pink, pale granulation within the wound bed. There is a large (67-100%) amount of necrotic tissue within the wound bed. Assessment Active Problems ICD-10 Non-pressure chronic ulcer of other part of right foot with fat layer exposed Non-pressure chronic ulcer of other part of left lower leg with fat layer exposed Other idiopathic peripheral autonomic neuropathy Osteomyelitis, unspecified Unspecified atrial fibrillation Long term (current) use of anticoagulants Lymphedema, not elsewhere classified Venous insufficiency (chronic) (peripheral) Chronic obstructive pulmonary disease, unspecified Patient's wounds are stable. Her main issue here is blood flow. She is unable to obtain an arteriogram due to her kidney function. She is scheduled to see nephrology on 8/10. For now can continue [facility] antibiotic and collagen. Follow-up in 2  weeks. Plan Follow-up Appointments: Return Appointment in 2 weeks. - Dr. Mikey Bussing and [place], Room 8 04/08/2022 3pm Tuesday Other: - **[facility] continue to use when it arrives under the Prisma to right amputation toe site.*** bactracin under the right great toe for as needed protection. 04/17/2022 [place] [facility] Edema Control - Lymphedema / SCD / Other: Elevate legs to the level of the heart or above for 30 minutes  daily and/or when sitting, a frequency of: - throughout the day. Avoid standing for long periods of time. Home Health: No change in wound care orders this week; continue Home Health for wound care. May utilize formulary equivalent dressing for wound treatment orders unless otherwise specified. - Home health weekly dressing changes. All other days daughter to change. Bacitracin ointment under right great toe as needed. all wounds keystone topical antibiotics and prisma. Other Home Health Orders/Instructions: - [facility]. WOUND #3: - Amputation Site - T oe Wound Laterality: Right Cleanser: Soap and Water (Home Health) 1 x Per Day/30 Days Discharge Instructions: May shower and wash wound with dial antibacterial soap and water prior to dressing change. Cleanser: Wound Cleanser (Home Health) 1 x Per Day/30 Days Discharge Instructions: Cleanse the wound with wound cleanser prior to applying a clean dressing using gauze sponges, not tissue or cotton balls. Topical: Compounding T opical Antibiotics (Home Health) 1 x Per Day/30 Days Discharge Instructions: [facility] once [facility], applies over the [place]. Prim Dressing: Promogran Prisma Matrix, 4.34 (sq in) (silver collagen) (Home Health) 1 x Per Day/30 Days ary Discharge Instructions: Moisten collagen with saline or hydrogel directly to wound bed. apply over the [place] when arrives. Secondary Dressing: Woven Gauze Sponges 2x2 in (Home Health) 1 x Per Day/30 Days Discharge  Instructions: Apply over primary dressing. ***Apply a rolled gauze under the right great toe. Secured With: Insurance underwriterConforming Stretch Gauze Bandage, Sterile 2x75 (in/in) (Home Health) 1 x Per Day/30 Days Discharge Instructions: Secure with stretch gauze or use paper tape to secure the gauze. Secured With: Paper Tape, 2x10 (in/yd) (Home Health) 1 x Per Day/30 Days Discharge Instructions: Secure dressing with tape as directed. WOUND #5: - T Great Wound Laterality: Right oe Cleanser: Soap and Water (Home Health) 1 x Per Day/30 Days Discharge Instructions: May shower and wash wound with dial antibacterial soap and water prior to dressing change. Cleanser: Wound Cleanser (Home Health) 1 x Per Day/30 Days Discharge Instructions: Cleanse the wound with wound cleanser prior to applying a clean dressing using gauze sponges, not tissue or cotton balls. Topical: Compounding T opical Antibiotics (Home Health) 1 x Per Day/30 Days Discharge Instructions: [facility] once [facility], applies over the [place]. Prim Dressing: Promogran Prisma Matrix, 4.34 (sq in) (silver collagen) (Home Health) 1 x Per Day/30 Days ary Discharge Instructions: Moisten collagen with saline or hydrogel directly to wound bed. apply over the [place] when arrives. Secondary Dressing: Woven Gauze Sponges 2x2 in (Home Health) 1 x Per Day/30 Days Discharge Instructions: Apply over primary dressing. may tape down with medipore tape. Secured With: Insurance underwriterConforming Stretch Gauze Bandage, Sterile 2x75 (in/in) (Home Health) 1 x Per Day/30 Days Discharge Instructions: Secure with stretch gauze or use paper tape to secure the gauze. Secured With: Paper Tape, 2x10 (in/yd) (Home Health) 1 x Per Day/30 Days Discharge Instructions: Secure dressing with tape as directed. 1. Keystone antibiotic and collagen 2. Follow-up in 2 weeks Electronic Signature(s) Signed: 03/27/2022 4:23:48 PM By: Geralyn CorwinHoffman, Hara Milholland DO Entered  By: Geralyn CorwinHoffman, Ashlinn Hemrick on 03/27/2022 16:12:41 -------------------------------------------------------------------------------- HxROS Details Patient Name: Date of Service: Ethelda ChickHILL, [name] 03/27/2022 3:00 PM Medical Record Number: [id number] Patient Account Number: [id number] Date of Birth/Sex: Treating RN: 04/09/1928 (88 y.o. [name]) Deaton, Bobbi Primary Care Provider: Mila PalmerWolters, Sharon Other Clinician: Referring Provider: Treating Provider/Extender: Burman RiisHoffman, Roylene Heaton Wolters, Sharon Weeks in Treatment: 8 Information Obtained From Patient Caregiver Eyes Medical History: Positive for:  Cataracts Hematologic/Lymphatic Medical History: Positive for: Anemia Past Medical History Notes: Thrombocytopenia Respiratory Medical History: Negative for: Chronic Obstructive Pulmonary Disease (COPD) Cardiovascular Medical History: Positive for: Congestive Heart Failure; Hypotension; Peripheral Venous Disease Endocrine Medical History: Past Medical History Notes: Hyperthyroidism, Hypothyroidism Musculoskeletal Medical History: Positive for: 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 Neurologic Medical History: Positive for: Neuropathy HBO Extended History Items Eyes: Cataracts Immunizations Pneumococcal Vaccine: Received Pneumococcal Vaccination: No Implantable Devices None Family and Social History Cancer: Yes - Siblings; Diabetes: No; Heart Disease: Yes; Stroke: Yes - Mother; Former smoker - quit 2003; Marital Status - Widowed; Alcohol Use: Never; Drug Use: No History; Caffeine Use: Never; Financial Concerns: No; Food, Clothing or Shelter Needs: No; Support System Lacking: No; Transportation Concerns: No Electronic Signature(s) Signed: 03/27/2022 4:23:48 PM By: Geralyn Corwin DO Signed: 03/27/2022 5:46:02 PM By: Shawn Stall RN, BSN Entered By: Geralyn Corwin on 03/27/2022 16:10:38 -------------------------------------------------------------------------------- SuperBill  Details Patient Name: Date of Service: [name] 03/27/2022 Medical Record Number: [id number] Patient Account Number: [id number] Date of Birth/Sex: Treating RN: [birth year] (88 y.o. [name], [name] Primary Care Provider: [name] Other Clinician: Referring Provider: Treating Provider/Extender: [name] in Treatment: 8 Diagnosis Coding ICD-10 Codes Code Description [phone] Non-pressure chronic ulcer of other part of right foot with fat layer exposed L97.822 Non-pressure chronic ulcer of other part of left lower leg with fat layer exposed G90.09 Other idiopathic peripheral autonomic neuropathy M86.9 Osteomyelitis, unspecified I48.91 Unspecified atrial fibrillation Z79.01 Long term (current) use of anticoagulants I89.0 Lymphedema, not elsewhere classified I87.2 Venous insufficiency (chronic) (peripheral) J44.9 Chronic obstructive pulmonary disease, unspecified I73.9 Peripheral vascular disease, unspecified Facility Procedures CPT4 Code: [id number] Description: 99214 - WOUND CARE VISIT-LEV 4 EST PT Modifier: Quantity: 1 Physician Procedures : CPT4 Code Description Modifier [id number] - WC PHYS LEVEL 3 - EST PT ICD-10 Diagnosis Description L97.512 Non-pressure chronic ulcer of other part of right foot with fat layer exposed M86.9 Osteomyelitis, unspecified I73.9 Peripheral vascular  disease, unspecified I89.0 Lymphedema, not elsewhere classified Quantity: 1 Electronic Signature(s) Signed: 03/27/2022 4:23:48 PM By: Geralyn Corwin DO Entered By: Geralyn Corwin on 03/27/2022 16:15:13

## 2022-03-28 NOTE — Progress Notes (Signed)
[facility] Note  04/11/2022     CHIEF COMPLAINT Patient presents for Retina Follow Up   HISTORY OF PRESENT ILLNESS: [name] is a 88 y.o. female who presents to the clinic today for:   HPI     Retina Follow Up   Patient presents with  Dry AMD.  In both eyes.  This started 6 months ago.  I, the attending physician,  performed the HPI with the patient and updated documentation appropriately.        Comments   Patient here for 6 months retina follow up for atropic non exu ARMD OU. Patient states vision as good as expected. No eye pain.       Last edited by Bernarda Caffey, MD on 04/12/2022  2:38 AM.    Patient states vision is stable, eyes are very dry, uses AT's BID OU, pt still follows with Dr. Frederico Hamman   Referring physician: Gevena Cotton, MD [address] [place],  [place] [address]  HISTORICAL INFORMATION:   Selected notes from the MEDICAL RECORD NUMBER Referred by Dr. Frederico Hamman for ret eval OU   CURRENT MEDICATIONS: Current Outpatient Medications (Ophthalmic Drugs)  Medication Sig   carboxymethylcellulose (REFRESH PLUS) 0.5 % SOLN Place 1 drop into both eyes 2 (two) times daily.   No current facility-administered medications for this visit. (Ophthalmic Drugs)   Current Outpatient Medications (Other)  Medication Sig   acetaminophen (TYLENOL) 325 MG tablet Take 650 mg by mouth every 6 (six) hours as needed for moderate pain.   bumetanide (BUMEX) 1 MG tablet Take 1 tablet (1 mg total) by mouth daily. May take an additional tablet as needed for weight gain of 3 pounds overnight or 5 pounds in one week.   cholecalciferol (VITAMIN D3) 25 MCG (1000 UT) tablet Take 1,000 Units by mouth daily.   clotrimazole-betamethasone (LOTRISONE) cream Apply 1 application. topically 2 (two) times daily as needed (rash/irritation).   diclofenac Sodium (VOLTAREN) 1 % GEL Apply 2 g topically 4 (four) times daily as needed (pain).   gabapentin  (NEURONTIN) 300 MG capsule Take 300 mg by mouth 2 (two) times daily.    levothyroxine (SYNTHROID) 100 MCG tablet Take 100 mcg by mouth every morning.   losartan (COZAAR) 100 MG tablet Take 1 tablet (100 mg total) by mouth daily.   Menthol, Topical Analgesic, (BIOFREEZE) 10 % CREA Apply 1 application. topically daily as needed (pain).   metoprolol succinate (TOPROL-XL) 50 MG 24 hr tablet TAKE 1 TABLET BY MOUTH EVERY DAY WITH OR IMMEDIATELY FOLLOWING A MEAL (Patient taking differently: Take 50 mg by mouth daily.)   Multiple Vitamins-Minerals (PRESERVISION/LUTEIN) CAPS Take 1 capsule by mouth 2 (two) times daily.    pantoprazole (PROTONIX) 40 MG tablet Take 40 mg by mouth daily.   senna-docusate (SENOKOT-S) 8.6-50 MG tablet Take 1 tablet by mouth 2 (two) times daily between meals as needed for moderate constipation or mild constipation.   spironolactone (ALDACTONE) 25 MG tablet Take 1 tablet (25 mg total) by mouth daily with breakfast.   vitamin B-12 (CYANOCOBALAMIN) 1000 MCG tablet Take 1,000 mcg by mouth daily.   warfarin (COUMADIN) 1 MG tablet Take 1-2 mg by mouth See admin instructions. Take 92m by mouth on Tues, Thur, Saturday & Sunday, Then take  2 mg on Monday, Wednesday, Friday.   Apoaequorin (PREVAGEN PO) Take 1 capsule by mouth at bedtime. (Patient not taking: Reported on 04/11/2022)   No current facility-administered medications for this  visit. (Other)   REVIEW OF SYSTEMS: ROS   Positive for: Eyes Negative for: Constitutional, Gastrointestinal, Neurological, Skin, Genitourinary, Musculoskeletal, HENT, Endocrine, Cardiovascular, Respiratory, Psychiatric, Allergic/Imm, Heme/Lymph Last edited by Theodore Demark, COA on 04/11/2022  2:18 PM.     ALLERGIES Allergies  Allergen Reactions   Amoxicillin Other (See Comments)    UNK reaction   Azithromycin     Other reaction(s): unsure   Codeine     Other reaction(s): unsure   Erythromycin Other (See Comments)    UNK reaction    Erythromycin Ethylsuccinate    Green Dyes Other (See Comments)    Allergic to ALL dyes   Iodine Other (See Comments)    Unk reaction   Misc. Sulfonamide Containing Compounds    Oxycodone Other (See Comments)    Unk reaction   Oxycodone-Acetaminophen Other (See Comments)    Unk reaction   Penicillins Other (See Comments)    Unk reaction   Sulfasalazine    Tramadol Hives   PAST MEDICAL HISTORY Past Medical History:  Diagnosis Date   Anticoagulant long-term use    Arrhythmia    Arthritis    Atrial fibrillation (HCC)    Congestive heart failure (CHF) (HCC)    Dysphagia    Esophageal reflux    Essential hypertension    Hiatal hernia    Pulmonary embolus ([place])    Thyroid disease    Past Surgical History:  Procedure Laterality Date   AMPUTATION TOE Right 11/17/2021   Procedure: RIGHT SECOND TOE AMPUTATION;  Surgeon: Wylene Simmer, MD;  Location: [facility];  Service: Orthopedics;  Laterality: Right;   COLONOSCOPY     ELBOW SURGERY     LAPAROSCOPIC HYSTERECTOMY     FAMILY HISTORY Family History  Problem Relation Age of Onset   Stroke Mother    Cancer Brother    SOCIAL HISTORY Social History   Tobacco Use   Smoking status: Former    Packs/day: 1.00    Types: Cigarettes    Quit date: 10/02/2001    Years since quitting: 20.5   Smokeless tobacco: Never   Tobacco comments:    former smoker  Vaping Use   Vaping Use: Never used       OPHTHALMIC EXAM:  Base Eye Exam     Visual Acuity (Snellen - Linear)       Right Left   Dist cc 20/70 -1 20/50 -2   Dist ph cc 20/60 -2 20/40 -2    Correction: Glasses         Tonometry (Tonopen, 2:15 PM)       Right Left   Pressure 09 11         Pupils       Dark Light Shape React APD   Right 4 3 Round Brisk None   Left 4 3 Round Brisk None         Visual Fields (Counting fingers)       Left Right    Full Full         Extraocular Movement       Right Left    Full, Ortho Full, Ortho          Neuro/Psych     Oriented x3: Yes   Mood/Affect: Normal         Dilation     Both eyes: 1.0% Mydriacyl, 2.5% Phenylephrine @ 2:14 PM           Slit Lamp and Fundus Exam  Slit Lamp Exam       Right Left   Lids/Lashes Dermatochalasis - upper lid, mild Meibomian gland dysfunction Dermatochalasis - upper lid, mild Meibomian gland dysfunction   Conjunctiva/Sclera White and quiet White and quiet   Cornea arcus, 2+ Punctate epithelial erosions, irregular tear film, decreased TBUT, mild central haze, 2+ guttata, 1-2+, Descemet's folds, tear film debris arcus, 1-2+ Punctate epithelial erosions, irregular tear film with debris, decreased TBUT, mild central haze, 1+ guttata, no edema   Anterior Chamber Deep and clear, narrow temporal angle Deep and clear, narrow temporal angle   Iris Round and dilated Round and dilated   Lens PC IOL in good position with open PC PC IOL in good position, 1-2+ Posterior capsular opacification   Anterior Vitreous Vitreous syneresis Vitreous syneresis         Fundus Exam       Right Left   Disc 360 PPA, mld Pallor, Sharp rim mild Pallor, Sharp rim   C/D Ratio 0.3 0.3   Macula Flat, Blunted foveal reflex, RPE mottling, clumping and atrophy, Drusen, prominent GA superior and nasal to fovea, No heme or edema Flat, Blunted foveal reflex, RPE mottling, clumping and atrophy, +GA -- stable, No heme or edema   Vessels Vascular attenuation, mild tortuousity attenuated, Tortuous   Periphery Attached, reticular degeneration, No heme  Attached, mild reticular degeneration, No heme            IMAGING AND PROCEDURES  Imaging and Procedures for 04/11/2022  OCT, Retina - OU - Both Eyes       Right Eye Quality was good. Central Foveal Thickness: 295. Progression has no prior data. Findings include normal foveal contour, no IRF, no SRF, retinal drusen , intraretinal hyper-reflective material, outer retinal atrophy (perifoveal ORA/GA).   Left Eye Quality was  good. Central Foveal Thickness: 320. Progression has no prior data. Findings include normal foveal contour, no IRF, no SRF, retinal drusen , intraretinal hyper-reflective material, outer retinal atrophy (Diffuse ORA/GA greatest superior and nasal mac).   Notes *Images captured and stored on drive  Diagnosis / Impression:  Nonexudative ARMD OU OD: perifoveal ORA/GA OS: Diffuse ORA/GA greatest superior and nasal mac No IRF/SRF OU  Clinical management:  See below  Abbreviations: NFP - Normal foveal profile. CME - cystoid macular edema. PED - pigment epithelial detachment. IRF - intraretinal fluid. SRF - subretinal fluid. EZ - ellipsoid zone. ERM - epiretinal membrane. ORA - outer retinal atrophy. ORT - outer retinal tubulation. SRHM - subretinal hyper-reflective material. IRHM - intraretinal hyper-reflective material            ASSESSMENT/PLAN:    ICD-10-CM   1. Advanced atrophic nonexudative age-related macular degeneration of both eyes without subfoveal involvement  H35.3133 OCT, Retina - OU - Both Eyes    2. Essential hypertension  I10     3. Hypertensive retinopathy of both eyes  H35.033     4. Pseudophakia, both eyes  Z96.1     5. PCO (posterior capsular opacification), left  H26.492     6. Dry eyes  H04.123      1. Age related macular degeneration, non-exudative, with +GA, both eyes (OS>OD)  - focal peripapillary ORA OU  - OCT shows OD: perifoveal ORA/GA; OS: Diffuse ORA/GA greatest superior and nasal mac  - BCVA 20/60-2 OD; 20/40-2 OS  - The incidence, anatomy, and pathology of dry AMD, risk of progression, and the AREDS and AREDS 2 study including smoking risks discussed with patient.   -  Cont amsler grid monitoring  - f/u 6 mos, DFE, OCT  2,3. Hypertensive retinopathy OU - discussed importance of tight BP control - monitor  4. Pseudophakia OU  - s/p CE/IOL OU  - IOLs in good position  - s/p YAG Cap OD (Dr. Frederico Hamman) - monitor  5. PCO OS  - 1-2+ -- likely  visually significant  - discussed possible YAG cap OS  - pt wishes to f/u in 3-4 wks for possible YAG OS  6. Dry eyes OU  - recommend artificial tears and lubricating ointment as needed   Ophthalmic Meds Ordered this visit:  No orders of the defined types were placed in this encounter.    Return in about 6 months (around 10/12/2022) for f/u 3-4 weeks, possible Yag OS, DFE, OCT.  There are no Patient Instructions on file for this visit.   Explained the diagnoses, plan, and follow up with the patient and they expressed understanding.  Patient expressed understanding of the importance of proper follow up care.  This document serves as a record of services personally performed by Gardiner Sleeper, MD, PhD. It was created on their behalf by Renaldo Reel, [place] an ophthalmic technician. The creation of this record is the provider's dictation and/or activities during the visit.    Electronically signed by:  Renaldo Reel, COT  03/28/22 2:42 AM  This document serves as a record of services personally performed by Gardiner Sleeper, MD, PhD. It was created on their behalf by San Jetty. Owens Shark, OA an ophthalmic technician. The creation of this record is the provider's dictation and/or activities during the visit.    Electronically signed by: San Jetty. Owens Shark, [place] 08.04.2023 2:42 AM   Gardiner Sleeper, M.D., Ph.D. Diseases & Surgery of the Retina and Vitreous [facility]  I have reviewed the above documentation for accuracy and completeness, and I agree with the above. Gardiner Sleeper, M.D., Ph.D. 04/12/22 2:42 AM  Abbreviations: M myopia (nearsighted); A astigmatism; H hyperopia (farsighted); P presbyopia; Mrx spectacle prescription;  CTL contact lenses; OD right eye; OS left eye; OU both eyes  XT exotropia; ET esotropia; PEK punctate epithelial keratitis; PEE punctate epithelial erosions; DES dry eye syndrome; MGD meibomian gland dysfunction; ATs artificial tears;  PFAT's preservative free artificial tears; [place] nuclear sclerotic cataract; PSC posterior subcapsular cataract; ERM epi-retinal membrane; PVD posterior vitreous detachment; RD retinal detachment; DM diabetes mellitus; DR diabetic retinopathy; NPDR non-proliferative diabetic retinopathy; PDR proliferative diabetic retinopathy; CSME clinically significant macular edema; DME diabetic macular edema; dbh dot blot hemorrhages; CWS cotton wool spot; POAG primary open angle glaucoma; C/D cup-to-disc ratio; HVF humphrey visual field; GVF goldmann visual field; OCT optical coherence tomography; IOP intraocular pressure; BRVO Branch retinal vein occlusion; CRVO central retinal vein occlusion; CRAO central retinal artery occlusion; BRAO branch retinal artery occlusion; RT retinal tear; SB scleral buckle; PPV pars plana vitrectomy; VH Vitreous hemorrhage; PRP panretinal laser photocoagulation; IVK intravitreal kenalog; VMT vitreomacular traction; MH Macular hole;  NVD neovascularization of the disc; NVE neovascularization elsewhere; AREDS age related eye disease study; ARMD age related macular degeneration; POAG primary open angle glaucoma; EBMD epithelial/anterior basement membrane dystrophy; ACIOL anterior chamber intraocular lens; IOL intraocular lens; PCIOL posterior chamber intraocular lens; Phaco/IOL phacoemulsification with intraocular lens placement; [place] photorefractive keratectomy; LASIK laser assisted in situ keratomileusis; HTN hypertension; DM diabetes mellitus; COPD chronic obstructive pulmonary disease

## 2022-04-08 ENCOUNTER — Encounter (HOSPITAL_BASED_OUTPATIENT_CLINIC_OR_DEPARTMENT_OTHER): Payer: Medicare HMO | Attending: Internal Medicine | Admitting: Internal Medicine

## 2022-04-08 DIAGNOSIS — I890 Lymphedema, not elsewhere classified: Secondary | ICD-10-CM | POA: Diagnosis not present

## 2022-04-08 DIAGNOSIS — Z7901 Long term (current) use of anticoagulants: Secondary | ICD-10-CM | POA: Diagnosis not present

## 2022-04-08 DIAGNOSIS — M869 Osteomyelitis, unspecified: Secondary | ICD-10-CM | POA: Insufficient documentation

## 2022-04-08 DIAGNOSIS — Z89421 Acquired absence of other right toe(s): Secondary | ICD-10-CM | POA: Insufficient documentation

## 2022-04-08 DIAGNOSIS — I739 Peripheral vascular disease, unspecified: Secondary | ICD-10-CM | POA: Insufficient documentation

## 2022-04-08 DIAGNOSIS — I872 Venous insufficiency (chronic) (peripheral): Secondary | ICD-10-CM | POA: Diagnosis not present

## 2022-04-08 DIAGNOSIS — L97512 Non-pressure chronic ulcer of other part of right foot with fat layer exposed: Secondary | ICD-10-CM | POA: Insufficient documentation

## 2022-04-08 DIAGNOSIS — I4891 Unspecified atrial fibrillation: Secondary | ICD-10-CM | POA: Insufficient documentation

## 2022-04-08 DIAGNOSIS — J449 Chronic obstructive pulmonary disease, unspecified: Secondary | ICD-10-CM | POA: Diagnosis not present

## 2022-04-08 DIAGNOSIS — L97822 Non-pressure chronic ulcer of other part of left lower leg with fat layer exposed: Secondary | ICD-10-CM | POA: Diagnosis not present

## 2022-04-08 DIAGNOSIS — G9009 Other idiopathic peripheral autonomic neuropathy: Secondary | ICD-10-CM | POA: Diagnosis not present

## 2022-04-08 NOTE — Progress Notes (Signed)
[place] MorrowHILL, Danaya M. ([id number]) Visit Report for 04/08/2022 Arrival Information Details Patient Name: Date of Service: [name], [name] 04/08/2022 3:00 PM Medical Record Number: [id number] Patient Account Number: [id number] Date of Birth/Sex: Treating RN: 02/05/1928 (88 y.o. Debara PickettF) Deaton, Yvonne KendallBobbi Primary Care Toye Rouillard: Mila PalmerWolters, Sharon Other Clinician: Referring Oberia Beaudoin: Treating Cherylene Ferrufino/Extender: Burman RiisHoffman, Jessica Wolters, Sharon Weeks in Treatment: 10 Visit Information History Since Last Visit Added or deleted any medications: No Patient Arrived: Wheel Chair Any new allergies or adverse reactions: No Arrival Time: 15:09 Had a fall or experienced change in No Accompanied By: daughter activities of daily living that may affect Transfer Assistance: None risk of falls: Patient Identification Verified: Yes Signs or symptoms of abuse/neglect since last visito No Secondary Verification Process Completed: Yes Hospitalized since last visit: No Patient Requires Transmission-Based Precautions: No Implantable device outside of the clinic excluding No Patient Has Alerts: Yes cellular tissue based products placed in the center Patient Alerts: Patient on Blood Thinner since last visit: Has Dressing in Place as Prescribed: Yes Pain Present Now: No Electronic Signature(s) Signed: 04/08/2022 4:26:53 PM By: Thayer Dallasick, Kimberly Entered By: Thayer Dallasick, Kimberly on 04/08/2022 15:10:55 -------------------------------------------------------------------------------- Clinic Level of Care Assessment Details Patient Name: Date of Service: [name], [name] 04/08/2022 3:00 PM Medical Record Number: [id number] Patient Account Number: [id number] Date of Birth/Sex: Treating RN: 12/03/1927 (88 y.o. Debara PickettF) Deaton, Millard.LoaBobbi Primary Care Tatayana Beshears: Mila PalmerWolters, Sharon Other Clinician: Referring Shandel Busic: Treating Harmonie Verrastro/Extender: Burman RiisHoffman, Jessica Wolters, Sharon Weeks in Treatment: 10 Clinic Level of Care Assessment Items TOOL 4 Quantity  Score X- 1 0 Use when only an EandM is performed on FOLLOW-UP visit ASSESSMENTS - Nursing Assessment / Reassessment X- 1 10 Reassessment of Co-morbidities (includes updates in patient status) X- 1 5 Reassessment of Adherence to Treatment Plan ASSESSMENTS - Wound and Skin A ssessment / Reassessment []  - 0 Simple Wound Assessment / Reassessment - one wound X- 2 5 Complex Wound Assessment / Reassessment - multiple wounds X- 1 10 Dermatologic / Skin Assessment (not related to wound area) ASSESSMENTS - Focused Assessment X- 1 5 Circumferential Edema Measurements - multi extremities []  - 0 Nutritional Assessment / Counseling / Intervention []  - 0 Lower Extremity Assessment (monofilament, tuning fork, pulses) []  - 0 Peripheral Arterial Disease Assessment (using hand held doppler) ASSESSMENTS - Ostomy and/or Continence Assessment and Care []  - 0 Incontinence Assessment and Management []  - 0 Ostomy Care Assessment and Management (repouching, etc.) PROCESS - Coordination of Care []  - 0 Simple Patient / Family Education for ongoing care X- 1 20 Complex (extensive) Patient / Family Education for ongoing care X- 1 10 Staff obtains ChiropractorConsents, Records, T Results / Process Orders est X- 1 10 Staff telephones HHA, Nursing Homes / Clarify orders / etc []  - 0 Routine Transfer to another Facility (non-emergent condition) []  - 0 Routine Hospital Admission (non-emergent condition) []  - 0 New Admissions / Manufacturing engineernsurance Authorizations / Ordering NPWT Apligraf, etc. , []  - 0 Emergency Hospital Admission (emergent condition) []  - 0 Simple Discharge Coordination X- 1 15 Complex (extensive) Discharge Coordination PROCESS - Special Needs []  - 0 Pediatric / Minor Patient Management []  - 0 Isolation Patient Management []  - 0 Hearing / Language / Visual special needs []  - 0 Assessment of Community assistance (transportation, D/C planning, etc.) []  - 0 Additional assistance / Altered  mentation []  - 0 Support Surface(s) Assessment (bed, cushion, seat, etc.) INTERVENTIONS - Wound Cleansing / Measurement []  - 0 Simple Wound Cleansing - one wound X- 2 5 Complex Wound Cleansing -  multiple wounds X- 1 5 Wound Imaging (photographs - any number of wounds)  - 0 Wound Tracing (instead of photographs)  - 0 Simple Wound Measurement - one wound X- 2 5 Complex Wound Measurement - multiple wounds INTERVENTIONS - Wound Dressings X - Small Wound Dressing one or multiple wounds 2 10  - 0 Medium Wound Dressing one or multiple wounds  - 0 Large Wound Dressing one or multiple wounds  - 0 Application of Medications - topical  - 0 Application of Medications - injection INTERVENTIONS - Miscellaneous  - 0 External ear exam  - 0 Specimen Collection (cultures, biopsies, blood, body fluids, etc.)  - 0 Specimen(s) / Culture(s) sent or taken to Lab for analysis  - 0 Patient Transfer (multiple staff / Nurse, adult / Similar devices)  - 0 Simple Staple / Suture removal (25 or less)  - 0 Complex Staple / Suture removal (26 or more)  - 0 Hypo / Hyperglycemic Management (close monitor of Blood Glucose)  - 0 Ankle / Brachial Index (ABI) - do not check if billed separately X- 1 5 Vital Signs Has the patient been seen at the hospital within the last three years: Yes Total Score: 145 Level Of Care: New/Established - Level 4 Electronic Signature(s) Signed: 04/08/2022 4:41:40 PM By: Shawn Stall RN, BSN Entered By: Shawn Stall on 04/08/2022 15:37:20 -------------------------------------------------------------------------------- Encounter Discharge Information Details Patient Name: Date of Service: [name] 04/08/2022 3:00 PM Medical Record Number: [id number] Patient Account Number: [id number] Date of Birth/Sex: Treating RN: Nov 18, 1927 (88 y.o. [name] Primary Care Grissel Tyrell: [name] Other Clinician: Referring Vicente Weidler: Treating  Cordney Barstow/Extender: [name] in Treatment: 10 Encounter Discharge Information Items Discharge Condition: Stable Ambulatory Status: Wheelchair Discharge Destination: Home Transportation: Private Auto Accompanied By: daughter Schedule Follow-up Appointment: Yes Clinical Summary of Care: Electronic Signature(s) Signed: 04/08/2022 4:41:40 PM By: Shawn Stall RN, BSN Entered By: Shawn Stall on 04/08/2022 15:37:47 -------------------------------------------------------------------------------- Lower Extremity Assessment Details Patient Name: Date of Service: [name] 04/08/2022 3:00 PM Medical Record Number: [id number] Patient Account Number: [id number] Date of Birth/Sex: Treating RN: 1928/08/17 (88 y.o. [name], [name] Primary Care Waylyn Tenbrink: [name] Other Clinician: Referring Farhaan Mabee: Treating Brinnley Lacap/Extender: [name] Weeks in Treatment: 10 Edema Assessment Assessed: Kyra Searles: No] Franne Forts: No] Edema: [Left: Yes] [Right: Yes] Calf Left: Right: Point of Measurement: 30 cm From Medial Instep 41.4 cm Ankle Left: Right: Point of Measurement: 9 cm From Medial Instep 23.4 cm Electronic Signature(s) Signed: 04/08/2022 4:26:53 PM By: Thayer Dallas Signed: 04/08/2022 4:41:40 PM By: Shawn Stall RN, BSN Entered By: Thayer Dallas on 04/08/2022 15:21:52 -------------------------------------------------------------------------------- Multi Wound Chart Details Patient Name: Date of Service: [name] 04/08/2022 3:00 PM Medical Record Number: [id number] Patient Account Number: [id number] Date of Birth/Sex: Treating RN: 01-Nov-1927 (88 y.o. [name], [name] Primary Care Clarisse Rodriges: [name] Other Clinician: Referring Nimisha Rathel: Treating Jesusa Stenerson/Extender: [name] in Treatment: 10 Vital Signs Height(in): 62 Pulse(bpm): 60 Weight(lbs): 181 Blood Pressure(mmHg): 133/57 Body Mass  Index(BMI): 33.1 Temperature(F): 98.1 Respiratory Rate(breaths/min): 18 Photos: [N/A:N/A] Right Amputation Site - Toe Right T Great oe N/A Wound Location: Surgical Injury Shear/Friction N/A Wounding Event: Open Surgical Wound Abrasion N/A Primary Etiology: Cataracts, Anemia, Congestive Heart Cataracts, Anemia, Congestive Heart N/A Comorbid History: Failure, Hypotension, Peripheral Failure, Hypotension, Peripheral Venous Disease, Osteomyelitis, Venous Disease, Osteomyelitis, Neuropathy Neuropathy 11/17/2021 02/06/2022 N/A Date Acquired: 10 8 N/A Weeks of Treatment: Open Open N/A Wound Status: No No N/A Wound Recurrence:  0.5x0.3x0.3 0.1x0.1x0.1 N/A Measurements L x W x D (cm) 0.118 0.008 N/A A (cm) : rea 0.035 0.001 N/A Volume (cm) : 72.70% 97.90% N/A % Reduction in Area: 83.80% 97.40% N/A % Reduction in Volume: Full Thickness Without Exposed Full Thickness Without Exposed N/A Classification: Support Structures Support Structures Medium Medium N/A Exudate Amount: Serosanguineous Serosanguineous N/A Exudate Type: red, brown red, brown N/A Exudate Color: Epibole Distinct, outline attached N/A Wound Margin: Small (1-33%) Small (1-33%) N/A Granulation Amount: Red Pink, Pale N/A Granulation Quality: Large (67-100%) Large (67-100%) N/A Necrotic Amount: Fat Layer (Subcutaneous Tissue): Yes Fat Layer (Subcutaneous Tissue): Yes N/A Exposed Structures: Fascia: No Fascia: No Tendon: No Tendon: No Muscle: No Muscle: No Joint: No Joint: No Bone: No Bone: No Small (1-33%) Small (1-33%) N/A Epithelialization: Treatment Notes Wound #3 (Amputation Site - Toe) Wound Laterality: Right Cleanser Soap and Water Discharge Instruction: May shower and wash wound with dial antibacterial soap and water prior to dressing change. Wound Cleanser Discharge Instruction: Cleanse the wound with wound cleanser prior to applying a clean dressing using gauze sponges, not tissue or  cotton balls. Peri-Wound Care Topical Compounding T opical Antibiotics Discharge Instruction: Apply T opical Antibiotics from [facility] daily once it arrives, applies over the [place]. Primary Dressing Promogran Prisma Matrix, 4.34 (sq in) (silver collagen) Discharge Instruction: Moisten collagen with saline or hydrogel directly to wound bed. apply over the [place] when arrives. Secondary Dressing Woven Gauze Sponges 2x2 in Discharge Instruction: Apply over primary dressing. ***Apply a rolled gauze under the right great toe. Secured With Conforming Stretch Gauze Bandage, Sterile 2x75 (in/in) Discharge Instruction: Secure with stretch gauze or use paper tape to secure the gauze. Paper Tape, 2x10 (in/yd) Discharge Instruction: Secure dressing with tape as directed. Compression Wrap Compression Stockings Add-Ons Wound #5 (Toe Great) Wound Laterality: Right Cleanser Soap and Water Discharge Instruction: May shower and wash wound with dial antibacterial soap and water prior to dressing change. Wound Cleanser Discharge Instruction: Cleanse the wound with wound cleanser prior to applying a clean dressing using gauze sponges, not tissue or cotton balls. Peri-Wound Care Topical Compounding T opical Antibiotics Discharge Instruction: Apply T opical Antibiotics from [facility] daily once it arrives, applies over the [place]. Primary Dressing Promogran Prisma Matrix, 4.34 (sq in) (silver collagen) Discharge Instruction: Moisten collagen with saline or hydrogel directly to wound bed. apply over the [place] when arrives. Secondary Dressing Woven Gauze Sponges 2x2 in Discharge Instruction: Apply over primary dressing. may tape down with medipore tape. Secured With Conforming Stretch Gauze Bandage, Sterile 2x75 (in/in) Discharge Instruction: Secure with stretch gauze or use paper tape to secure the gauze. Paper Tape, 2x10 (in/yd) Discharge Instruction: Secure dressing with tape  as directed. Compression Wrap Compression Stockings Add-Ons Electronic Signature(s) Signed: 04/08/2022 4:05:23 PM By: Geralyn Corwin DO Signed: 04/08/2022 4:41:40 PM By: Shawn Stall RN, BSN Entered By: Geralyn Corwin on 04/08/2022 15:50:58 -------------------------------------------------------------------------------- Multi-Disciplinary Care Plan Details Patient Name: Date of Service: [name] 04/08/2022 3:00 PM Medical Record Number: [id number] Patient Account Number: [id number] Date of Birth/Sex: Treating RN: December 11, 1927 (88 y.o. [name], [name] Primary Care Jakala Herford: [name] Other Clinician: Referring Antoria Lanza: Treating Jalan Bodi/Extender: [name] in Treatment: 10 Active Inactive Nutrition Nursing Diagnoses: Potential for alteratiion in Nutrition/Potential for imbalanced nutrition Goals: Patient/caregiver agrees to and verbalizes understanding of need to use nutritional supplements and/or vitamins as prescribed Date Initiated: 01/28/2022 Target Resolution Date: 05/02/2022 Goal Status: Active Interventions: Assess patient nutrition upon admission and as needed per  policy Provide education on nutrition Treatment Activities: Patient referred to Primary Care Physician for further nutritional evaluation : 01/28/2022 Notes: Pain, Acute or Chronic Nursing Diagnoses: Pain, acute or chronic: actual or potential Potential alteration in comfort, pain Goals: Patient will verbalize adequate pain control and receive pain control interventions during procedures as needed Date Initiated: 01/28/2022 Target Resolution Date: 05/02/2022 Goal Status: Active Interventions: Encourage patient to take pain medications as prescribed Provide education on pain management Treatment Activities: Administer pain control measures as ordered : 01/28/2022 Notes: Wound/Skin Impairment Nursing Diagnoses: Knowledge deficit related to ulceration/compromised  skin integrity Goals: Patient/caregiver will verbalize understanding of skin care regimen Date Initiated: 01/28/2022 Target Resolution Date: 05/09/2022 Goal Status: Active Interventions: Assess patient/caregiver ability to perform ulcer/skin care regimen upon admission and as needed Assess ulceration(s) every visit Provide education on ulcer and skin care Treatment Activities: Skin care regimen initiated : 01/28/2022 Topical wound management initiated : 01/28/2022 Notes: Electronic Signature(s) Signed: 04/08/2022 4:41:40 PM By: Shawn Stall RN, BSN Entered By: Shawn Stall on 04/08/2022 15:34:46 -------------------------------------------------------------------------------- Pain Assessment Details Patient Name: Date of Service: [name] 04/08/2022 3:00 PM Medical Record Number: [id number] Patient Account Number: [id number] Date of Birth/Sex: Treating RN: [birth year] (88 y.o. [name], [name] Primary Care Tameria Patti: [name] Other Clinician: Referring Xuan Mateus: Treating Raenette Sakata/Extender: [name] in Treatment: 10 Active Problems Location of Pain Severity and Description of Pain Patient Has Paino No Site Locations Pain Management and Medication Current Pain Management: Electronic Signature(s) Signed: 04/08/2022 4:26:53 PM By: Thayer Dallas Signed: 04/08/2022 4:41:40 PM By: Shawn Stall RN, BSN Entered By: Thayer Dallas on 04/08/2022 15:15:12 -------------------------------------------------------------------------------- Patient/Caregiver Education Details Patient Name: Date of Service: [name] 8/1/2023andnbsp3:00 PM Medical Record Number: [id number] Patient Account Number: [id number] Date of Birth/Gender: Treating RN: 1928-03-19 (88 y.o. [name] Primary Care Physician: [name] Other Clinician: Referring Physician: Treating Physician/Extender: [name] in Treatment:  10 Education Assessment Education Provided To: Patient and Caregiver Education Topics Provided Wound/Skin Impairment: Handouts: Skin Care Do's and Dont's Methods: Explain/Verbal Responses: Reinforcements needed Electronic Signature(s) Signed: 04/08/2022 4:41:40 PM By: Shawn Stall RN, BSN Entered By: Shawn Stall on 04/08/2022 15:36:32 -------------------------------------------------------------------------------- Wound Assessment Details Patient Name: Date of Service: [name] 04/08/2022 3:00 PM Medical Record Number: [id number] Patient Account Number: [id number] Date of Birth/Sex: Treating RN: 24-Mar-1928 (88 y.o. [name], [name] Primary Care Aleksia Freiman: [name] Other Clinician: Referring Kendall Arnell: Treating Jahmiya Guidotti/Extender: [name] Weeks in Treatment: 10 Wound Status Wound Number: 3 Primary Open Surgical Wound Etiology: Wound Location: Right Amputation Site - Toe Wound Open Wounding Event: Surgical Injury Status: Date Acquired: 11/17/2021 Comorbid Cataracts, Anemia, Congestive Heart Failure, Hypotension, Weeks Of Treatment: 10 History: Peripheral Venous Disease, Osteomyelitis, Neuropathy Clustered Wound: No Photos Wound Measurements Length: (cm) 0.5 Width: (cm) 0.3 Depth: (cm) 0.3 Area: (cm) 0.118 Volume: (cm) 0.035 % Reduction in Area: 72.7% % Reduction in Volume: 83.8% Epithelialization: Small (1-33%) Tunneling: No Undermining: No Wound Description Classification: Full Thickness Without Exposed Support Structures Wound Margin: Epibole Exudate Amount: Medium Exudate Type: Serosanguineous Exudate Color: red, brown Foul Odor After Cleansing: No Slough/Fibrino Yes Wound Bed Granulation Amount: Small (1-33%) Exposed Structure Granulation Quality: Red Fascia Exposed: No Necrotic Amount: Large (67-100%) Fat Layer (Subcutaneous Tissue) Exposed: Yes Necrotic Quality: Adherent Slough Tendon Exposed: No Muscle Exposed:  No Joint Exposed: No Bone Exposed: No Treatment Notes Wound #3 (Amputation Site - Toe) Wound Laterality: Right Cleanser Soap and Water Discharge Instruction: May  shower and wash wound with dial antibacterial soap and water prior to dressing change. Wound Cleanser Discharge Instruction: Cleanse the wound with wound cleanser prior to applying a clean dressing using gauze sponges, not tissue or cotton balls. Peri-Wound Care Topical Compounding T opical Antibiotics Discharge Instruction: Apply T opical Antibiotics from [facility] daily once it arrives, applies over the [place]. Primary Dressing Promogran Prisma Matrix, 4.34 (sq in) (silver collagen) Discharge Instruction: Moisten collagen with saline or hydrogel directly to wound bed. apply over the [place] when arrives. Secondary Dressing Woven Gauze Sponges 2x2 in Discharge Instruction: Apply over primary dressing. ***Apply a rolled gauze under the right great toe. Secured With Conforming Stretch Gauze Bandage, Sterile 2x75 (in/in) Discharge Instruction: Secure with stretch gauze or use paper tape to secure the gauze. Paper Tape, 2x10 (in/yd) Discharge Instruction: Secure dressing with tape as directed. Compression Wrap Compression Stockings Add-Ons Electronic Signature(s) Signed: 04/08/2022 4:26:53 PM By: Thayer Dallas Signed: 04/08/2022 4:41:40 PM By: Shawn Stall RN, BSN Entered By: Thayer Dallas on 04/08/2022 15:22:42 -------------------------------------------------------------------------------- Wound Assessment Details Patient Name: Date of Service: [name] 04/08/2022 3:00 PM Medical Record Number: [id number] Patient Account Number: [id number] Date of Birth/Sex: Treating RN: 02/13/1928 (88 y.o. [name], [name] Primary Care Islam Villescas: [name] Other Clinician: Referring Yarisbel Miranda: Treating Shanekia Latella/Extender: [name] Weeks in Treatment: 10 Wound Status Wound Number: 5 Primary  Abrasion Etiology: Wound Location: Right T Great oe Wound Open Wounding Event: Shear/Friction Status: Date Acquired: 02/06/2022 Comorbid Cataracts, Anemia, Congestive Heart Failure, Hypotension, Weeks Of Treatment: 8 History: Peripheral Venous Disease, Osteomyelitis, Neuropathy Clustered Wound: No Photos Wound Measurements Length: (cm) 0.1 Width: (cm) 0.1 Depth: (cm) 0.1 Area: (cm) 0.008 Volume: (cm) 0.001 % Reduction in Area: 97.9% % Reduction in Volume: 97.4% Epithelialization: Small (1-33%) Tunneling: No Undermining: No Wound Description Classification: Full Thickness Without Exposed Support Structures Wound Margin: Distinct, outline attached Exudate Amount: Medium Exudate Type: Serosanguineous Exudate Color: red, brown Foul Odor After Cleansing: No Slough/Fibrino Yes Wound Bed Granulation Amount: Small (1-33%) Exposed Structure Granulation Quality: Pink, Pale Fascia Exposed: No Necrotic Amount: Large (67-100%) Fat Layer (Subcutaneous Tissue) Exposed: Yes Tendon Exposed: No Muscle Exposed: No Joint Exposed: No Bone Exposed: No Treatment Notes Wound #5 (Toe Great) Wound Laterality: Right Cleanser Soap and Water Discharge Instruction: May shower and wash wound with dial antibacterial soap and water prior to dressing change. Wound Cleanser Discharge Instruction: Cleanse the wound with wound cleanser prior to applying a clean dressing using gauze sponges, not tissue or cotton balls. Peri-Wound Care Topical Compounding T opical Antibiotics Discharge Instruction: Apply T opical Antibiotics from [facility] daily once it arrives, applies over the [place]. Primary Dressing Promogran Prisma Matrix, 4.34 (sq in) (silver collagen) Discharge Instruction: Moisten collagen with saline or hydrogel directly to wound bed. apply over the [place] when arrives. Secondary Dressing Woven Gauze Sponges 2x2 in Discharge Instruction: Apply over primary dressing. may tape  down with medipore tape. Secured With Conforming Stretch Gauze Bandage, Sterile 2x75 (in/in) Discharge Instruction: Secure with stretch gauze or use paper tape to secure the gauze. Paper Tape, 2x10 (in/yd) Discharge Instruction: Secure dressing with tape as directed. Compression Wrap Compression Stockings Add-Ons Electronic Signature(s) Signed: 04/08/2022 4:26:53 PM By: Thayer Dallas Signed: 04/08/2022 4:41:40 PM By: Shawn Stall RN, BSN Entered By: Thayer Dallas on 04/08/2022 15:23:30 -------------------------------------------------------------------------------- Vitals Details Patient Name: Date of Service: [name] 04/08/2022 3:00 PM Medical Record Number: [id number] Patient Account Number: [id number] Date of Birth/Sex: Treating RN: 09/22/1927 (88 y.o.  [name], [name] Primary Care Milo Solana: [name] Other Clinician: Referring Criss Pallone: Treating Celestia Duva/Extender: [name] in Treatment: 10 Vital Signs Time Taken: 15:12 Temperature (F): 98.1 Height (in): 62 Pulse (bpm): 60 Weight (lbs): 181 Respiratory Rate (breaths/min): 18 Body Mass Index (BMI): 33.1 Blood Pressure (mmHg): 133/57 Reference Range: 80 - 120 mg / dl Electronic Signature(s) Signed: 04/08/2022 4:26:53 PM By: Thayer Dallas Entered By: Thayer Dallas on 04/08/2022 15:15:03

## 2022-04-08 NOTE — Progress Notes (Signed)
KORENE, DULA ([id number]) Visit Report for 04/08/2022 Chief Complaint Document Details Patient Name: Date of Service: LOWANDA, CASHAW. 04/08/2022 3:00 PM Medical Record Number: [id number] Patient Account Number: [id number] Date of Birth/Sex: Treating RN: 05/28/1928 (88 y.o. [name] Primary Care Provider: [name] Other Clinician: Referring Provider: Treating Provider/Extender: [name] in Treatment: 10 Information Obtained from: Patient Chief Complaint 01/28/2022; right second toe amputation site dehiscence and bilateral lower extremity wounds. Electronic Signature(s) Signed: 04/08/2022 4:05:23 PM By: Geralyn Corwin DO Entered By: Geralyn Corwin on 04/08/2022 15:51:14 -------------------------------------------------------------------------------- HPI Details Patient Name: Date of Service: Mensing, [name] 04/08/2022 3:00 PM Medical Record Number: [id number] Patient Account Number: [id number] Date of Birth/Sex: Treating RN: [birth year] (88 y.o. [name] Primary Care Provider: [name] Other Clinician: Referring Provider: Treating Provider/Extender: [name] in Treatment: 10 History of Present Illness HPI Description: Admission 01/28/2022 Ms. Ena Demary is a 88 year old female with a past medical history of idiopathic peripheral neuropathy status post amputation to the second right toe secondary to osteomyelitis, COPD and A-fib on Coumadin the presents to the clinic for a 26-month history of nonhealing ulcer to a previous amputation site on the second right toe. She states she has tried Medihoney and silver alginate in the past to this area with little benefit. She also has 2 small areas limited to skin breakdown to her lower extremities bilaterally. She has chronic venous insufficiency but not has not been wearing her compression stockings. She states she bumped her legs against an object and not so  the wound started. She has been using Medihoney to the sites. She denies signs of infection. 6/2; patient presents for follow-up. She had an x-ray of her right foot done at last clinic visit and this was negative for evidence of osteomyelitis. She also had a wound culture done that showed extra high levels of Staph aureus. I recommended Keystone antibiotics for this and this was ordered. She had ABIs with TBI's done as well that showed monophasic waveforms to the right foot with TBI of 0 and an ABI of 0.52. Urgent referral was made to vein and vascular and she saw Dr. Durwin Nora on 6/1, yesterday and he recommended an arteriogram. This is scheduled for 6/16. Patient also reports a new wound to the right great toe. This is a blister that has ruptured. She also reports increased erythema to the toe. 6/6; the patient was worked in urgently today at the insistence of her daughter out of concern for a new wound on the lateral part of the plantar right great toe. She has her original postsurgical wound after the amputation of the right second toe, she has a wound on the medial part of the right great toe. The patient is apparently going for an angiogram by Dr. Durwin Nora in 2 weeks time. 6/13; patient presents for follow-up. She has been using bacitracin to the abrasion on the right great toe. She has been using collagen and Keystone antibiotic to the amputation site. She has no issues or complaints today. She denies signs of infection. 6/22; patient presents for follow-up. She states that her abdominal aortogram was canceled due to her renal function. She has been using Keystone antibiotics to the amputation site and Medihoney to the right great toe wound. At the pace of the right great toe she has a slitlike open area that she thinks was caused by the tape from the dressing. 6/29; patient presents for follow-up. She has been  using Keystone antibiotics to the amputation site along with collagen. She has been using  Medihoney to the right great toe wound. She has no other wounds. She denies signs of infection. 7/13; patient presents for follow-up. She has been using Keystone antibiotics and collagen to the wound sites. She currently denies signs of infection. She states she is scheduled to see me nephrology next month. 7/20; patient presents for follow-up. She continue Keystone antibiotics and collagen to the wound sites. She has no issues or complaints today. 8/1; patient presents for follow up. She continues to use keystone antibiotics and collagen to the wound sites. She has no issues or complaints today. Electronic Signature(s) Signed: 04/08/2022 4:05:23 PM By: Geralyn Corwin DO Entered By: Geralyn Corwin on 04/08/2022 15:54:48 -------------------------------------------------------------------------------- Physical Exam Details Patient Name: Date of Service: Sow, [name] 04/08/2022 3:00 PM Medical Record Number: [id number] Patient Account Number: [id number] Date of Birth/Sex: Treating RN: 04-20-1928 (88 y.o. [name] Primary Care Provider: [name] Other Clinician: Referring Provider: Treating Provider/Extender: [name] in Treatment: 10 Constitutional respirations regular, non-labored and within target range for patient.[name] Kitchen Psychiatric pleasant and cooperative. Notes Right foot: T the second digit there is an amputation site that is open with pale granulation tissue. Does not probe to bone. No surrounding signs of infection. o T the lateral aspect of the great toe there is an open wound with pale granulation tissue. No surrounding signs of infection to either wound bed. o Electronic Signature(s) Signed: 04/08/2022 4:05:23 PM By: Geralyn Corwin DO Entered By: Geralyn Corwin on 04/08/2022 16:01:25 -------------------------------------------------------------------------------- Physician Orders Details Patient Name: Date of Service: [name] 04/08/2022 3:00 PM Medical Record Number: [id number] Patient Account Number: [id number] Date of Birth/Sex: Treating RN: 03-Aug-1928 (88 y.o. [name], [name] Primary Care Provider: [name] Other Clinician: Referring Provider: Treating Provider/Extender: [name] in Treatment: 10 Verbal / Phone Orders: No Diagnosis Coding ICD-10 Coding Code Description [phone] Non-pressure chronic ulcer of other part of right foot with fat layer exposed L97.822 Non-pressure chronic ulcer of other part of left lower leg with fat layer exposed G90.09 Other idiopathic peripheral autonomic neuropathy M86.9 Osteomyelitis, unspecified I48.91 Unspecified atrial fibrillation Z79.01 Long term (current) use of anticoagulants I89.0 Lymphedema, not elsewhere classified I87.2 Venous insufficiency (chronic) (peripheral) J44.9 Chronic obstructive pulmonary disease, unspecified I73.9 Peripheral vascular disease, unspecified Follow-up Appointments ppointment in 2 weeks. - Dr. Mikey Bussing and [place], Room 8 04/22/2022 3pm Tuesday Return A Other: - [facility] continue to use when it arrives under the Prisma to right amputation toe site.*** bactracin under the right great toe for as needed protection. 04/17/2022 [place] [facility] Edema Control - Lymphedema / SCD / Other Elevate legs to the level of the heart or above for 30 minutes daily and/or when sitting, a frequency of: - throughout the day. Avoid standing for long periods of time. Home Health No change in wound care orders this week; continue Home Health for wound care. May utilize formulary equivalent dressing for wound treatment orders unless otherwise specified. - Home health weekly dressing changes. All other days daughter to change. Bacitracin ointment under right great toe as needed. all wounds keystone topical antibiotics and prisma. Other Home Health Orders/Instructions: - [facility]. Wound  Treatment Wound #3 - Amputation Site - Toe Wound Laterality: Right Cleanser: Soap and Water (Home Health) 1 x Per Day/30 Days Discharge Instructions: May shower and wash wound with dial antibacterial soap and water prior to dressing change.  Cleanser: Wound Cleanser (Home Health) 1 x Per Day/30 Days Discharge Instructions: Cleanse the wound with wound cleanser prior to applying a clean dressing using gauze sponges, not tissue or cotton balls. Topical: Compounding T opical Antibiotics (Home Health) 1 x Per Day/30 Days Discharge Instructions: Apply T opical Antibiotics from [facility] daily once it arrives, applies over the [place]. Prim Dressing: Promogran Prisma Matrix, 4.34 (sq in) (silver collagen) (Home Health) 1 x Per Day/30 Days ary Discharge Instructions: Moisten collagen with saline or hydrogel directly to wound bed. apply over the [place] when arrives. Secondary Dressing: Woven Gauze Sponges 2x2 in (Home Health) 1 x Per Day/30 Days Discharge Instructions: Apply over primary dressing. ***Apply a rolled gauze under the right great toe. Secured With: Insurance underwriter, Sterile 2x75 (in/in) (Home Health) 1 x Per Day/30 Days Discharge Instructions: Secure with stretch gauze or use paper tape to secure the gauze. Secured With: Paper Tape, 2x10 (in/yd) (Home Health) 1 x Per Day/30 Days Discharge Instructions: Secure dressing with tape as directed. Wound #5 - T Great oe Wound Laterality: Right Cleanser: Soap and Water (Home Health) 1 x Per Day/30 Days Discharge Instructions: May shower and wash wound with dial antibacterial soap and water prior to dressing change. Cleanser: Wound Cleanser (Home Health) 1 x Per Day/30 Days Discharge Instructions: Cleanse the wound with wound cleanser prior to applying a clean dressing using gauze sponges, not tissue or cotton balls. Topical: Compounding T opical Antibiotics (Home Health) 1 x Per Day/30 Days Discharge Instructions: Apply T  opical Antibiotics from [facility] daily once it arrives, applies over the [place]. Prim Dressing: Promogran Prisma Matrix, 4.34 (sq in) (silver collagen) (Home Health) 1 x Per Day/30 Days ary Discharge Instructions: Moisten collagen with saline or hydrogel directly to wound bed. apply over the [place] when arrives. Secondary Dressing: Woven Gauze Sponges 2x2 in (Home Health) 1 x Per Day/30 Days Discharge Instructions: Apply over primary dressing. may tape down with medipore tape. Secured With: Insurance underwriter, Sterile 2x75 (in/in) (Home Health) 1 x Per Day/30 Days Discharge Instructions: Secure with stretch gauze or use paper tape to secure the gauze. Secured With: Paper Tape, 2x10 (in/yd) (Home Health) 1 x Per Day/30 Days Discharge Instructions: Secure dressing with tape as directed. Electronic Signature(s) Signed: 04/08/2022 4:05:23 PM By: Geralyn Corwin DO Entered By: Geralyn Corwin on 04/08/2022 16:01:32 -------------------------------------------------------------------------------- Problem List Details Patient Name: Date of Service: [name] 04/08/2022 3:00 PM Medical Record Number: [id number] Patient Account Number: [id number] Date of Birth/Sex: Treating RN: 16-Jan-1928 (88 y.o. [name], [name] Primary Care Provider: [name] Other Clinician: Referring Provider: Treating Provider/Extender: [name] in Treatment: 10 Active Problems ICD-10 Encounter Code Description Active Date MDM Diagnosis [phone] Non-pressure chronic ulcer of other part of right foot with fat layer exposed 01/28/2022 No Yes L97.822 Non-pressure chronic ulcer of other part of left lower leg with fat layer exposed5/23/2023 No Yes G90.09 Other idiopathic peripheral autonomic neuropathy 01/28/2022 No Yes M86.9 Osteomyelitis, unspecified 01/28/2022 No Yes I48.91 Unspecified atrial fibrillation 01/28/2022 No Yes Z79.01 Long term (current) use of  anticoagulants 01/28/2022 No Yes I89.0 Lymphedema, not elsewhere classified 01/28/2022 No Yes I87.2 Venous insufficiency (chronic) (peripheral) 01/28/2022 No Yes J44.9 Chronic obstructive pulmonary disease, unspecified 01/28/2022 No Yes I73.9 Peripheral vascular disease, unspecified 03/27/2022 No Yes Inactive Problems ICD-10 Code Description Active Date Inactive Date L97.812 Non-pressure chronic ulcer of other part of right lower leg with fat layer exposed 01/28/2022 01/28/2022 Resolved Problems Electronic Signature(s)  Signed: 04/08/2022 4:05:23 PM By: Geralyn Corwin DO Entered By: Geralyn Corwin on 04/08/2022 15:50:52 -------------------------------------------------------------------------------- Progress Note Details Patient Name: Date of Service: [place], [name] 04/08/2022 3:00 PM Medical Record Number: [id number] Patient Account Number: [id number] Date of Birth/Sex: Treating RN: May 19, 1928 (88 y.o. [name] Primary Care Provider: [name] Other Clinician: Referring Provider: Treating Provider/Extender: [name] in Treatment: 10 Subjective Chief Complaint Information obtained from Patient 01/28/2022; right second toe amputation site dehiscence and bilateral lower extremity wounds. History of Present Illness (HPI) Admission 01/28/2022 Ms. Jenalyn Girdner is a 88 year old female with a past medical history of idiopathic peripheral neuropathy status post amputation to the second right toe secondary to osteomyelitis, COPD and A-fib on Coumadin the presents to the clinic for a 67-month history of nonhealing ulcer to a previous amputation site on the second right toe. She states she has tried Medihoney and silver alginate in the past to this area with little benefit. She also has 2 small areas limited to skin breakdown to her lower extremities bilaterally. She has chronic venous insufficiency but not has not been wearing her compression stockings. She  states she bumped her legs against an object and not so the wound started. She has been using Medihoney to the sites. She denies signs of infection. 6/2; patient presents for follow-up. She had an x-ray of her right foot done at last clinic visit and this was negative for evidence of osteomyelitis. She also had a wound culture done that showed extra high levels of Staph aureus. I recommended Keystone antibiotics for this and this was ordered. She had ABIs with TBI's done as well that showed monophasic waveforms to the right foot with TBI of 0 and an ABI of 0.52. Urgent referral was made to vein and vascular and she saw Dr. Durwin Nora on 6/1, yesterday and he recommended an arteriogram. This is scheduled for 6/16. Patient also reports a new wound to the right great toe. This is a blister that has ruptured. She also reports increased erythema to the toe. 6/6; the patient was worked in urgently today at the insistence of her daughter out of concern for a new wound on the lateral part of the plantar right great toe. She has her original postsurgical wound after the amputation of the right second toe, she has a wound on the medial part of the right great toe. The patient is apparently going for an angiogram by Dr. Durwin Nora in 2 weeks time. 6/13; patient presents for follow-up. She has been using bacitracin to the abrasion on the right great toe. She has been using collagen and Keystone antibiotic to the amputation site. She has no issues or complaints today. She denies signs of infection. 6/22; patient presents for follow-up. She states that her abdominal aortogram was canceled due to her renal function. She has been using Keystone antibiotics to the amputation site and Medihoney to the right great toe wound. At the pace of the right great toe she has a slitlike open area that she thinks was caused by the tape from the dressing. 6/29; patient presents for follow-up. She has been using Keystone antibiotics to the  amputation site along with collagen. She has been using Medihoney to the right great toe wound. She has no other wounds. She denies signs of infection. 7/13; patient presents for follow-up. She has been using Keystone antibiotics and collagen to the wound sites. She currently denies signs of infection. She states she is scheduled to see me  nephrology next month. 7/20; patient presents for follow-up. She continue Keystone antibiotics and collagen to the wound sites. She has no issues or complaints today. 8/1; patient presents for follow up. She continues to use keystone antibiotics and collagen to the wound sites. She has no issues or complaints today. Patient History Information obtained from Patient, Caregiver. Family History Cancer - Siblings, Heart Disease, Stroke - Mother, No family history of Diabetes. Social History Former smoker - quit 2003, Marital Status - Widowed, Alcohol Use - Never, Drug Use - No History, Caffeine Use - Never. Medical History Eyes Patient has history of Cataracts Hematologic/Lymphatic Patient has history of Anemia Respiratory Denies history of Chronic Obstructive Pulmonary Disease (COPD) Cardiovascular Patient has history of Congestive Heart Failure, Hypotension, Peripheral Venous Disease Musculoskeletal Patient has history of 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 Neurologic Patient has history of Neuropathy Medical A Surgical History Notes nd Hematologic/Lymphatic Thrombocytopenia Endocrine Hyperthyroidism, Hypothyroidism Objective Constitutional respirations regular, non-labored and within target range for patient.. Vitals Time Taken: 3:12 PM, Height: 62 in, Weight: 181 lbs, BMI: 33.1, Temperature: 98.1 F, Pulse: 60 bpm, Respiratory Rate: 18 breaths/min, Blood Pressure: 133/57 mmHg. Psychiatric pleasant and cooperative. General Notes: Right foot: T the second digit there is an amputation site that is open with pale granulation tissue.  Does not probe to bone. No surrounding o signs of infection. T the lateral aspect of the great toe there is an open wound with pale granulation tissue. No surrounding signs of infection to either wound o bed. Integumentary (Hair, Skin) Wound #3 status is Open. Original cause of wound was Surgical Injury. The date acquired was: 11/17/2021. The wound has been in treatment 10 weeks. The wound is located on the Right Amputation Site - T The wound measures 0.5cm length x 0.3cm width x 0.3cm depth; 0.118cm^2 area and 0.035cm^3 volume. oe. There is Fat Layer (Subcutaneous Tissue) exposed. There is no tunneling or undermining noted. There is a medium amount of serosanguineous drainage noted. The wound margin is epibole. There is small (1-33%) red granulation within the wound bed. There is a large (67-100%) amount of necrotic tissue within the wound bed including Adherent Slough. Wound #5 status is Open. Original cause of wound was Shear/Friction. The date acquired was: 02/06/2022. The wound has been in treatment 8 weeks. The wound is located on the Right T Great. The wound measures 0.1cm length x 0.1cm width x 0.1cm depth; 0.008cm^2 area and 0.001cm^3 volume. There is Fat Layer oe (Subcutaneous Tissue) exposed. There is no tunneling or undermining noted. There is a medium amount of serosanguineous drainage noted. The wound margin is distinct with the outline attached to the wound base. There is small (1-33%) pink, pale granulation within the wound bed. There is a large (67-100%) amount of necrotic tissue within the wound bed. Assessment Active Problems ICD-10 Non-pressure chronic ulcer of other part of right foot with fat layer exposed Non-pressure chronic ulcer of other part of left lower leg with fat layer exposed Other idiopathic peripheral autonomic neuropathy Osteomyelitis, unspecified Unspecified atrial fibrillation Long term (current) use of anticoagulants Lymphedema, not elsewhere  classified Venous insufficiency (chronic) (peripheral) Chronic obstructive pulmonary disease, unspecified Peripheral vascular disease, unspecified Patient's wounds are stable. No signs of infection. This is a palliative wound care case due to PAD and no option for revascularization. I recommended continuing keystone and collagen. Follow up in 2 weeks. Plan Follow-up Appointments: Return Appointment in 2 weeks. - Dr. Mikey BussingHoffman and [place], Room 8 04/22/2022 3pm Tuesday Other: - **  [facility] continue to use when it arrives under the Prisma to right amputation toe site.*** bactracin under the right great toe for as needed protection. 04/17/2022 [place] [facility] Edema Control - Lymphedema / SCD / Other: Elevate legs to the level of the heart or above for 30 minutes daily and/or when sitting, a frequency of: - throughout the day. Avoid standing for long periods of time. Home Health: No change in wound care orders this week; continue Home Health for wound care. May utilize formulary equivalent dressing for wound treatment orders unless otherwise specified. - Home health weekly dressing changes. All other days daughter to change. Bacitracin ointment under right great toe as needed. all wounds keystone topical antibiotics and prisma. Other Home Health Orders/Instructions: - [facility]. WOUND #3: - Amputation Site - T oe Wound Laterality: Right Cleanser: Soap and Water (Home Health) 1 x Per Day/30 Days Discharge Instructions: May shower and wash wound with dial antibacterial soap and water prior to dressing change. Cleanser: Wound Cleanser (Home Health) 1 x Per Day/30 Days Discharge Instructions: Cleanse the wound with wound cleanser prior to applying a clean dressing using gauze sponges, not tissue or cotton balls. Topical: Compounding T opical Antibiotics (Home Health) 1 x Per Day/30 Days Discharge Instructions: Apply T opical Antibiotics from [facility] daily once it arrives,  applies over the [place]. Prim Dressing: Promogran Prisma Matrix, 4.34 (sq in) (silver collagen) (Home Health) 1 x Per Day/30 Days ary Discharge Instructions: Moisten collagen with saline or hydrogel directly to wound bed. apply over the [place] when arrives. Secondary Dressing: Woven Gauze Sponges 2x2 in (Home Health) 1 x Per Day/30 Days Discharge Instructions: Apply over primary dressing. ***Apply a rolled gauze under the right great toe. Secured With: Insurance underwriter, Sterile 2x75 (in/in) (Home Health) 1 x Per Day/30 Days Discharge Instructions: Secure with stretch gauze or use paper tape to secure the gauze. Secured With: Paper Tape, 2x10 (in/yd) (Home Health) 1 x Per Day/30 Days Discharge Instructions: Secure dressing with tape as directed. WOUND #5: - T Great Wound Laterality: Right oe Cleanser: Soap and Water (Home Health) 1 x Per Day/30 Days Discharge Instructions: May shower and wash wound with dial antibacterial soap and water prior to dressing change. Cleanser: Wound Cleanser (Home Health) 1 x Per Day/30 Days Discharge Instructions: Cleanse the wound with wound cleanser prior to applying a clean dressing using gauze sponges, not tissue or cotton balls. Topical: Compounding T opical Antibiotics (Home Health) 1 x Per Day/30 Days Discharge Instructions: Apply T opical Antibiotics from [facility] daily once it arrives, applies over the [place]. Prim Dressing: Promogran Prisma Matrix, 4.34 (sq in) (silver collagen) (Home Health) 1 x Per Day/30 Days ary Discharge Instructions: Moisten collagen with saline or hydrogel directly to wound bed. apply over the [place] when arrives. Secondary Dressing: Woven Gauze Sponges 2x2 in (Home Health) 1 x Per Day/30 Days Discharge Instructions: Apply over primary dressing. may tape down with medipore tape. Secured With: Insurance underwriter, Sterile 2x75 (in/in) (Home Health) 1 x Per Day/30 Days Discharge  Instructions: Secure with stretch gauze or use paper tape to secure the gauze. Secured With: Paper Tape, 2x10 (in/yd) (Home Health) 1 x Per Day/30 Days Discharge Instructions: Secure dressing with tape as directed. 1. Keystone and collagen daily 2. Follow up in 2 weeks Electronic Signature(s) Signed: 04/08/2022 4:05:23 PM By: Geralyn Corwin DO Entered By: Geralyn Corwin on 04/08/2022 16:03:31 -------------------------------------------------------------------------------- HxROS Details Patient Name: Date of Service: Scalf, [name]  04/08/2022 3:00 PM Medical Record Number: [id number] Patient Account Number: [id number] Date of Birth/Sex: Treating RN: [birth year] (88 y.o. [name] Primary Care Provider: [name] Other Clinician: Referring Provider: Treating Provider/Extender: [name] in Treatment: 10 Information Obtained From Patient Caregiver Eyes Medical History: Positive for: Cataracts Hematologic/Lymphatic Medical History: Positive for: Anemia Past Medical History Notes: Thrombocytopenia Respiratory Medical History: Negative for: Chronic Obstructive Pulmonary Disease (COPD) Cardiovascular Medical History: Positive for: Congestive Heart Failure; Hypotension; Peripheral Venous Disease Endocrine Medical History: Past Medical History Notes: Hyperthyroidism, Hypothyroidism Musculoskeletal Medical History: Positive for: 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 Neurologic Medical History: Positive for: Neuropathy HBO Extended History Items Eyes: Cataracts Immunizations Pneumococcal Vaccine: Received Pneumococcal Vaccination: No Implantable Devices None Family and Social History Cancer: Yes - Siblings; Diabetes: No; Heart Disease: Yes; Stroke: Yes - Mother; Former smoker - quit 2003; Marital Status - Widowed; Alcohol Use: Never; Drug Use: No History; Caffeine Use: Never; Financial Concerns: No; Food, Clothing or  Shelter Needs: No; Support System Lacking: No; Transportation Concerns: No Electronic Signature(s) Signed: 04/08/2022 4:05:23 PM By: Geralyn Corwin DO Signed: 04/08/2022 4:41:40 PM By: Shawn Stall RN, BSN Entered By: Geralyn Corwin on 04/08/2022 15:54:53 -------------------------------------------------------------------------------- SuperBill Details Patient Name: Date of Service: [name] 04/08/2022 Medical Record Number: [id number] Patient Account Number: [id number] Date of Birth/Sex: Treating RN: 1928-05-04 (88 y.o. [name], [name] Primary Care Provider: [name] Other Clinician: Referring Provider: Treating Provider/Extender: [name] in Treatment: 10 Diagnosis Coding ICD-10 Codes Code Description [id number] Non-pressure chronic ulcer of other part of right foot with fat layer exposed L97.822 Non-pressure chronic ulcer of other part of left lower leg with fat layer exposed G90.09 Other idiopathic peripheral autonomic neuropathy M86.9 Osteomyelitis, unspecified I48.91 Unspecified atrial fibrillation Z79.01 Long term (current) use of anticoagulants I89.0 Lymphedema, not elsewhere classified I87.2 Venous insufficiency (chronic) (peripheral) J44.9 Chronic obstructive pulmonary disease, unspecified I73.9 Peripheral vascular disease, unspecified Facility Procedures Physician Procedures : CPT4 Code Description Modifier [id number] - WC PHYS LEVEL 3 - EST PT ICD-10 Diagnosis Description L97.512 Non-pressure chronic ulcer of other part of right foot with fat layer exposed M86.9 Osteomyelitis, unspecified I73.9 Peripheral vascular  disease, unspecified I87.2 Venous insufficiency (chronic) (peripheral) Quantity: 1 Electronic Signature(s) Signed: 04/08/2022 4:05:23 PM By: Geralyn Corwin DO Entered By: Geralyn Corwin on 04/08/2022 16:04:12

## 2022-04-11 ENCOUNTER — Encounter (INDEPENDENT_AMBULATORY_CARE_PROVIDER_SITE_OTHER): Payer: Self-pay | Admitting: Ophthalmology

## 2022-04-11 ENCOUNTER — Ambulatory Visit (INDEPENDENT_AMBULATORY_CARE_PROVIDER_SITE_OTHER): Payer: Medicare HMO | Admitting: Ophthalmology

## 2022-04-11 DIAGNOSIS — H353133 Nonexudative age-related macular degeneration, bilateral, advanced atrophic without subfoveal involvement: Secondary | ICD-10-CM | POA: Diagnosis not present

## 2022-04-11 DIAGNOSIS — I10 Essential (primary) hypertension: Secondary | ICD-10-CM

## 2022-04-11 DIAGNOSIS — Z961 Presence of intraocular lens: Secondary | ICD-10-CM

## 2022-04-11 DIAGNOSIS — H26492 Other secondary cataract, left eye: Secondary | ICD-10-CM

## 2022-04-11 DIAGNOSIS — H04123 Dry eye syndrome of bilateral lacrimal glands: Secondary | ICD-10-CM

## 2022-04-11 DIAGNOSIS — H35033 Hypertensive retinopathy, bilateral: Secondary | ICD-10-CM | POA: Diagnosis not present

## 2022-04-12 ENCOUNTER — Encounter (INDEPENDENT_AMBULATORY_CARE_PROVIDER_SITE_OTHER): Payer: Self-pay | Admitting: Ophthalmology

## 2022-04-22 ENCOUNTER — Encounter (HOSPITAL_BASED_OUTPATIENT_CLINIC_OR_DEPARTMENT_OTHER): Payer: Medicare HMO | Admitting: Internal Medicine

## 2022-04-22 DIAGNOSIS — L97512 Non-pressure chronic ulcer of other part of right foot with fat layer exposed: Secondary | ICD-10-CM

## 2022-04-22 DIAGNOSIS — I739 Peripheral vascular disease, unspecified: Secondary | ICD-10-CM

## 2022-04-22 DIAGNOSIS — M869 Osteomyelitis, unspecified: Secondary | ICD-10-CM | POA: Diagnosis not present

## 2022-04-22 DIAGNOSIS — I872 Venous insufficiency (chronic) (peripheral): Secondary | ICD-10-CM | POA: Diagnosis not present

## 2022-04-23 NOTE — Progress Notes (Signed)
[name] ([id number]) Visit Report for 04/22/2022 Chief Complaint Document Details Patient Name: Date of Service: [name]. 04/22/2022 3:00 PM Medical Record Number: [id number] Patient Account Number: [id number] Date of Birth/Sex: Treating RN: May 30, 1928 (88 y.o. [name] Primary Care Provider: [name] Other Clinician: Referring Provider: Treating Provider/Extender: Theodosia Quay in Treatment: 12 Information Obtained from: Patient Chief Complaint 01/28/2022; right second toe amputation site dehiscence and bilateral lower extremity wounds. Electronic Signature(s) Signed: 04/22/2022 4:20:15 PM By: Kalman Shan DO Entered By: Kalman Shan on 04/22/2022 15:58:53 -------------------------------------------------------------------------------- HPI Details Patient Name: Date of Service: Grau, [name] 04/22/2022 3:00 PM Medical Record Number: [id number] Patient Account Number: [id number] Date of Birth/Sex: Treating RN: 1928-02-08 (88 y.o. [name] Primary Care Provider: [name] Other Clinician: Referring Provider: Treating Provider/Extender: Theodosia Quay in Treatment: 12 History of Present Illness HPI Description: Admission 01/28/2022 [name] is a 88 year old female with a past medical history of idiopathic peripheral neuropathy status post amputation to the second right toe secondary to osteomyelitis, COPD and A-fib on Coumadin the presents to the clinic for a 36-month history of nonhealing ulcer to a previous amputation site on the second right toe. She states she has tried Medihoney and silver alginate in the past to this area with little benefit. She also has 2 small areas limited to skin breakdown to her lower extremities bilaterally. She has chronic venous insufficiency but not has not been wearing her compression stockings. She states she bumped her legs against an object and not  so the wound started. She has been using Medihoney to the sites. She denies signs of infection. 6/2; patient presents for follow-up. She had an x-ray of her right foot done at last clinic visit and this was negative for evidence of osteomyelitis. She also had a wound culture done that showed extra high levels of Staph aureus. I recommended Keystone antibiotics for this and this was ordered. She had ABIs with TBI's done as well that showed monophasic waveforms to the right foot with TBI of 0 and an ABI of 0.52. Urgent referral was made to vein and vascular and she saw Dr. Doren Custard on 6/1, yesterday and he recommended an arteriogram. This is scheduled for 6/16. Patient also reports a new wound to the right great toe. This is a blister that has ruptured. She also reports increased erythema to the toe. 6/6; the patient was worked in urgently today at the insistence of her daughter out of concern for a new wound on the lateral part of the plantar right great toe. She has her original postsurgical wound after the amputation of the right second toe, she has a wound on the medial part of the right great toe. The patient is apparently going for an angiogram by Dr. Doren Custard in 2 weeks time. 6/13; patient presents for follow-up. She has been using bacitracin to the abrasion on the right great toe. She has been using collagen and Keystone antibiotic to the amputation site. She has no issues or complaints today. She denies signs of infection. 6/22; patient presents for follow-up. She states that her abdominal aortogram was canceled due to her renal function. She has been using Keystone antibiotics to the amputation site and Medihoney to the right great toe wound. At the pace of the right great toe she has a slitlike open area that she thinks was caused by the tape from the dressing. 6/29; patient presents for follow-up. She has been  using Keystone antibiotics to the amputation site along with collagen. She has been using  Medihoney to the right great toe wound. She has no other wounds. She denies signs of infection. 7/13; patient presents for follow-up. She has been using Keystone antibiotics and collagen to the wound sites. She currently denies signs of infection. She states she is scheduled to see me nephrology next month. 7/20; patient presents for follow-up. She continue Keystone antibiotics and collagen to the wound sites. She has no issues or complaints today. 8/1; patient presents for follow up. She continues to use keystone antibiotics and collagen to the wound sites. She has no issues or complaints today. 8/15; patient presents for follow-up. She has been using Keystone antibiotics and collagen to the wound sites. She followed up with her nephrologist who made medication changes. She is supposed to get a repeat BMP in 2 weeks. Her decrease in renal function was a limiting factor in obtaining an arteriogram for potential intervention for revascularization. She currently denies signs of infection. Electronic Signature(s) Signed: 04/22/2022 4:20:15 PM By: Kalman Shan DO Entered By: Kalman Shan on 04/22/2022 16:00:08 -------------------------------------------------------------------------------- Physical Exam Details Patient Name: Date of Service: [name], [name] 04/22/2022 3:00 PM Medical Record Number: [id number] Patient Account Number: [id number] Date of Birth/Sex: Treating RN: [birth year] (88 y.o. [name] Primary Care Provider: [name] Other Clinician: Referring Provider: Treating Provider/Extender: Theodosia Quay in Treatment: 12 Constitutional respirations regular, non-labored and within target range for patient.. Cardiovascular 2+ dorsalis pedis/posterior tibialis pulses. Psychiatric pleasant and cooperative. Notes Right foot: T the second digit there is an amputation site that is open with pale granulation tissue. Does not probe to bone. No  surrounding signs of infection. o T the lateral aspect of the great toe there is an open wound with pale granulation tissue. No surrounding signs of infection to either wound bed. o Electronic Signature(s) Signed: 04/22/2022 4:20:15 PM By: Kalman Shan DO Entered By: Kalman Shan on 04/22/2022 16:01:52 -------------------------------------------------------------------------------- Physician Orders Details Patient Name: Date of Service: [name]. 04/22/2022 3:00 PM Medical Record Number: [id number] Patient Account Number: [id number] Date of Birth/Sex: Treating RN: May 26, 1928 (88 y.o. [name], [name] Primary Care Provider: [name] Other Clinician: Referring Provider: Treating Provider/Extender: Theodosia Quay in Treatment: 12 Verbal / Phone Orders: No Diagnosis Coding ICD-10 Coding Code Description [phone] Non-pressure chronic ulcer of other part of right foot with fat layer exposed L97.822 Non-pressure chronic ulcer of other part of left lower leg with fat layer exposed G90.09 Other idiopathic peripheral autonomic neuropathy M86.9 Osteomyelitis, unspecified I48.91 Unspecified atrial fibrillation Z79.01 Long term (current) use of anticoagulants I89.0 Lymphedema, not elsewhere classified I87.2 Venous insufficiency (chronic) (peripheral) J44.9 Chronic obstructive pulmonary disease, unspecified I73.9 Peripheral vascular disease, unspecified Follow-up Appointments Return appointment in 1 month. - Dr. Heber Carpenter and [place], Room 8 05/20/2022 64m Tuesday Other: - bactracin under the right great toe for as needed protection. Follow up with [facility]. Anesthetic Wound #3 Right Amputation Site - Toe (In clinic) Topical Lidocaine 5% applied to wound bed Wound #5 Right T Great oe (In clinic) Topical Lidocaine 5% applied to wound bed Edema Control - Lymphedema / SCD / Other Elevate legs to the level of the heart or above for 30 minutes daily  and/or when sitting, a frequency of: - throughout the day. Avoid standing for long periods of time. Home Health No change in wound care orders this week; continue Home Health for wound care. May utilize  formulary equivalent dressing for wound treatment orders unless otherwise specified. - Home health weekly dressing changes. All other days daughter to change. Bacitracin ointment under right great toe as needed. all wounds keystone topical antibiotics and prisma. Other Home Health Orders/Instructions: - [facility]. Wound Treatment Wound #3 - Amputation Site - Toe Wound Laterality: Right Cleanser: Soap and Water (Home Health) 1 x Per Day/30 Days Discharge Instructions: May shower and wash wound with dial antibacterial soap and water prior to dressing change. Cleanser: Wound Cleanser (Home Health) 1 x Per Day/30 Days Discharge Instructions: Cleanse the wound with wound cleanser prior to applying a clean dressing using gauze sponges, not tissue or cotton balls. Topical: Compounding T opical Antibiotics (Home Health) 1 x Per Day/30 Days Discharge Instructions: Apply T opical Antibiotics from [facility] daily once it arrives, applies over the [place]. Prim Dressing: Promogran Prisma Matrix, 4.34 (sq in) (silver collagen) (Home Health) 1 x Per Day/30 Days ary Discharge Instructions: Moisten collagen with saline or hydrogel directly to wound bed. apply over the [place] when arrives. Secondary Dressing: Woven Gauze Sponges 2x2 in (Home Health) 1 x Per Day/30 Days Discharge Instructions: Apply over primary dressing. ***Apply a rolled gauze under the right great toe. Secured With: Insurance underwriter, Sterile 2x75 (in/in) (Home Health) 1 x Per Day/30 Days Discharge Instructions: Secure with stretch gauze or use paper tape to secure the gauze. Secured With: Paper Tape, 2x10 (in/yd) (Home Health) 1 x Per Day/30 Days Discharge Instructions: Secure dressing with tape as  directed. Wound #5 - T Great oe Wound Laterality: Right Cleanser: Soap and Water (Home Health) 1 x Per Day/30 Days Discharge Instructions: May shower and wash wound with dial antibacterial soap and water prior to dressing change. Cleanser: Wound Cleanser (Home Health) 1 x Per Day/30 Days Discharge Instructions: Cleanse the wound with wound cleanser prior to applying a clean dressing using gauze sponges, not tissue or cotton balls. Topical: Compounding T opical Antibiotics (Home Health) 1 x Per Day/30 Days Discharge Instructions: Apply T opical Antibiotics from [facility] daily once it arrives, applies over the [place]. Prim Dressing: Promogran Prisma Matrix, 4.34 (sq in) (silver collagen) (Home Health) 1 x Per Day/30 Days ary Discharge Instructions: Moisten collagen with saline or hydrogel directly to wound bed. apply over the [place] when arrives. Secondary Dressing: Woven Gauze Sponges 2x2 in (Home Health) 1 x Per Day/30 Days Discharge Instructions: Apply over primary dressing. may tape down with medipore tape. Secured With: Insurance underwriter, Sterile 2x75 (in/in) (Home Health) 1 x Per Day/30 Days Discharge Instructions: Secure with stretch gauze or use paper tape to secure the gauze. Secured With: Paper Tape, 2x10 (in/yd) (Home Health) 1 x Per Day/30 Days Discharge Instructions: Secure dressing with tape as directed. Patient Medications llergies: penicillin, erythromycin base, green dyes, iodine, sulfasalazine, tramadol, oxycodone, codeine A Notifications Medication Indication Start End 04/22/2022 lidocaine DOSE 0 - topical 5 % gel - apply topically for any debridements in clinic Electronic Signature(s) Signed: 04/22/2022 4:20:15 PM By: Geralyn Corwin DO Entered By: Geralyn Corwin on 04/22/2022 16:02:00 Prescription 04/22/2022 -------------------------------------------------------------------------------- Stender, Chayse M. Geralyn Corwin DO Patient  Name: Provider: 04/26/1928 [id number] Date of Birth: NPI#: [id number] Sex: DEA #: [phone] Phone #: License #: Eligha Bridegroom [facility] Patient Address: [address] [facility] [address] [place], [place] [address] [address] [place], [place] [address] [phone] Allergies penicillin; erythromycin base; green dyes; iodine; sulfasalazine; tramadol; oxycodone; codeine Medication Medication: Route: Strength: Form:  lidocaine 5 % topical gel topical 5% gel Class: TOPICAL LOCAL ANESTHETICS Dose: Frequency / Time: Indication: 0 apply topically for any debridements in clinic Number of Refills: Number of Units: 0 Generic Substitution: Start Date: End Date: One Time Use: Substitution Permitted [id number] No Note to Pharmacy: Hand Signature: Date(s): Electronic Signature(s) Signed: 04/22/2022 4:20:15 PM By: Kalman Shan DO Entered By: Kalman Shan on 04/22/2022 16:02:00 -------------------------------------------------------------------------------- Problem List Details Patient Name: Date of Service: [name]. 04/22/2022 3:00 PM Medical Record Number: [id number] Patient Account Number: [id number] Date of Birth/Sex: Treating RN: Mar 23, 1928 (88 y.o. [name], Tammi Klippel Primary Care Provider: [name] Other Clinician: Referring Provider: Treating Provider/Extender: Theodosia Quay in Treatment: 12 Active Problems ICD-10 Encounter Code Description Active Date MDM Diagnosis [phone] Non-pressure chronic ulcer of other part of right foot with fat layer exposed 01/28/2022 No Yes L97.822 Non-pressure chronic ulcer of other part of left lower leg with fat layer exposed5/23/2023 No Yes G90.09 Other idiopathic peripheral autonomic neuropathy 01/28/2022 No Yes M86.9 Osteomyelitis, unspecified 01/28/2022 No Yes I48.91 Unspecified atrial fibrillation 01/28/2022 No Yes Z79.01 Long term (current) use of  anticoagulants 01/28/2022 No Yes I89.0 Lymphedema, not elsewhere classified 01/28/2022 No Yes I87.2 Venous insufficiency (chronic) (peripheral) 01/28/2022 No Yes J44.9 Chronic obstructive pulmonary disease, unspecified 01/28/2022 No Yes I73.9 Peripheral vascular disease, unspecified 03/27/2022 No Yes Inactive Problems ICD-10 Code Description Active Date Inactive Date L97.812 Non-pressure chronic ulcer of other part of right lower leg with fat layer exposed 01/28/2022 01/28/2022 Resolved Problems Electronic Signature(s) Signed: 04/22/2022 4:20:15 PM By: Kalman Shan DO Entered By: Kalman Shan on 04/22/2022 15:58:43 -------------------------------------------------------------------------------- Progress Note Details Patient Name: Date of Service: [name]. 04/22/2022 3:00 PM Medical Record Number: [id number] Patient Account Number: [id number] Date of Birth/Sex: Treating RN: [birth year] (88 y.o. [name] Primary Care Provider: [name] Other Clinician: Referring Provider: Treating Provider/Extender: Theodosia Quay in Treatment: 12 Subjective Chief Complaint Information obtained from Patient 01/28/2022; right second toe amputation site dehiscence and bilateral lower extremity wounds. History of Present Illness (HPI) Admission 01/28/2022 [name] is a 88 year old female with a past medical history of idiopathic peripheral neuropathy status post amputation to the second right toe secondary to osteomyelitis, COPD and A-fib on Coumadin the presents to the clinic for a 1-month history of nonhealing ulcer to a previous amputation site on the second right toe. She states she has tried Medihoney and silver alginate in the past to this area with little benefit. She also has 2 small areas limited to skin breakdown to her lower extremities bilaterally. She has chronic venous insufficiency but not has not been wearing her compression stockings. She  states she bumped her legs against an object and not so the wound started. She has been using Medihoney to the sites. She denies signs of infection. 6/2; patient presents for follow-up. She had an x-ray of her right foot done at last clinic visit and this was negative for evidence of osteomyelitis. She also had a wound culture done that showed extra high levels of Staph aureus. I recommended Keystone antibiotics for this and this was ordered. She had ABIs with TBI's done as well that showed monophasic waveforms to the right foot with TBI of 0 and an ABI of 0.52. Urgent referral was made to vein and vascular and she saw Dr. Doren Custard on 6/1, yesterday and he recommended an arteriogram. This is scheduled for 6/16. Patient also reports a new wound to the right great toe.  This is a blister that has ruptured. She also reports increased erythema to the toe. 6/6; the patient was worked in urgently today at the insistence of her daughter out of concern for a new wound on the lateral part of the plantar right great toe. She has her original postsurgical wound after the amputation of the right second toe, she has a wound on the medial part of the right great toe. The patient is apparently going for an angiogram by Dr. Doren Custard in 2 weeks time. 6/13; patient presents for follow-up. She has been using bacitracin to the abrasion on the right great toe. She has been using collagen and Keystone antibiotic to the amputation site. She has no issues or complaints today. She denies signs of infection. 6/22; patient presents for follow-up. She states that her abdominal aortogram was canceled due to her renal function. She has been using Keystone antibiotics to the amputation site and Medihoney to the right great toe wound. At the pace of the right great toe she has a slitlike open area that she thinks was caused by the tape from the dressing. 6/29; patient presents for follow-up. She has been using Keystone antibiotics to the  amputation site along with collagen. She has been using Medihoney to the right great toe wound. She has no other wounds. She denies signs of infection. 7/13; patient presents for follow-up. She has been using Keystone antibiotics and collagen to the wound sites. She currently denies signs of infection. She states she is scheduled to see me nephrology next month. 7/20; patient presents for follow-up. She continue Keystone antibiotics and collagen to the wound sites. She has no issues or complaints today. 8/1; patient presents for follow up. She continues to use keystone antibiotics and collagen to the wound sites. She has no issues or complaints today. 8/15; patient presents for follow-up. She has been using Keystone antibiotics and collagen to the wound sites. She followed up with her nephrologist who made medication changes. She is supposed to get a repeat BMP in 2 weeks. Her decrease in renal function was a limiting factor in obtaining an arteriogram for potential intervention for revascularization. She currently denies signs of infection. Patient History Information obtained from Patient, Caregiver. Family History Cancer - Siblings, Heart Disease, Stroke - Mother, No family history of Diabetes. Social History Former smoker - quit 2003, Marital Status - Widowed, Alcohol Use - Never, Drug Use - No History, Caffeine Use - Never. Medical History Eyes Patient has history of Cataracts Hematologic/Lymphatic Patient has history of Anemia Respiratory Denies history of Chronic Obstructive Pulmonary Disease (COPD) Cardiovascular Patient has history of Congestive Heart Failure, Hypotension, Peripheral Venous Disease Musculoskeletal Patient has history of 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 Neurologic Patient has history of Neuropathy Medical A Surgical History Notes nd Hematologic/Lymphatic Thrombocytopenia Endocrine Hyperthyroidism,  Hypothyroidism Objective Constitutional respirations regular, non-labored and within target range for patient.. Vitals Time Taken: 3:27 PM, Height: 62 in, Weight: 181 lbs, BMI: 33.1, Temperature: 98.7 F, Pulse: 63 bpm, Respiratory Rate: 20 breaths/min, Blood Pressure: 137/72 mmHg. Cardiovascular 2+ dorsalis pedis/posterior tibialis pulses. Psychiatric pleasant and cooperative. General Notes: Right foot: T the second digit there is an amputation site that is open with pale granulation tissue. Does not probe to bone. No surrounding o signs of infection. T the lateral aspect of the great toe there is an open wound with pale granulation tissue. No surrounding signs of infection to either wound o bed. Integumentary (Hair, Skin) Wound #3 status is Open. Original  cause of wound was Surgical Injury. The date acquired was: 11/17/2021. The wound has been in treatment 12 weeks. The wound is located on the Right Amputation Site - T The wound measures 0.5cm length x 0.3cm width x 0.5cm depth; 0.118cm^2 area and 0.059cm^3 volume. oe. There is Fat Layer (Subcutaneous Tissue) exposed. There is no tunneling or undermining noted. There is a medium amount of serosanguineous drainage noted. The wound margin is epibole. There is medium (34-66%) red granulation within the wound bed. There is a medium (34-66%) amount of necrotic tissue within the wound bed including Adherent Slough. Wound #5 status is Open. Original cause of wound was Shear/Friction. The date acquired was: 02/06/2022. The wound has been in treatment 10 weeks. The wound is located on the Right T Great. The wound measures 0.3cm length x 0.4cm width x 0.2cm depth; 0.094cm^2 area and 0.019cm^3 volume. There is Fat oe Layer (Subcutaneous Tissue) exposed. There is no tunneling or undermining noted. There is a medium amount of serosanguineous drainage noted. The wound margin is distinct with the outline attached to the wound base. There is medium  (34-66%) pink, pale granulation within the wound bed. There is a medium (34- 66%) amount of necrotic tissue within the wound bed including Adherent Slough. Assessment Active Problems ICD-10 Non-pressure chronic ulcer of other part of right foot with fat layer exposed Non-pressure chronic ulcer of other part of left lower leg with fat layer exposed Other idiopathic peripheral autonomic neuropathy Osteomyelitis, unspecified Unspecified atrial fibrillation Long term (current) use of anticoagulants Lymphedema, not elsewhere classified Venous insufficiency (chronic) (peripheral) Chronic obstructive pulmonary disease, unspecified Peripheral vascular disease, unspecified Patient's amputation site is stable. Her right great toe wound appears well-healing. I recommended continuing Keystone antibiotic with collagen. Patient's ability to obtain the arteriogram for potential revascularization is on hold because of her decrease in renal function. Medication changes have been made by her nephrologist and She is supposed to have repeat lab work in 2 weeks. Pending these results she will follow-up with vein and vascular to see if she needs to reschedule her arteriogram. These wounds will be very slow to heal and her amputation site may not heal at all. She is understanding of this. Follow-up in 1 month. Plan Follow-up Appointments: Return appointment in 1 month. - Dr. Mikey Bussing and [place], Room 8 05/20/2022 44m Tuesday Other: - bactracin under the right great toe for as needed protection. Follow up with [facility]. Anesthetic: Wound #3 Right Amputation Site - T oe: (In clinic) Topical Lidocaine 5% applied to wound bed Wound #5 Right T Great: oe (In clinic) Topical Lidocaine 5% applied to wound bed Edema Control - Lymphedema / SCD / Other: Elevate legs to the level of the heart or above for 30 minutes daily and/or when sitting, a frequency of: - throughout the day. Avoid standing for long periods of  time. Home Health: No change in wound care orders this week; continue Home Health for wound care. May utilize formulary equivalent dressing for wound treatment orders unless otherwise specified. - Home health weekly dressing changes. All other days daughter to change. Bacitracin ointment under right great toe as needed. all wounds keystone topical antibiotics and prisma. Other Home Health Orders/Instructions: - [facility]. The following medication(s) was prescribed: lidocaine topical 5 % gel 0 apply topically for any debridements in clinic was prescribed at facility WOUND #3: - Amputation Site - T oe Wound Laterality: Right Cleanser: Soap and Water (Home Health) 1 x Per Day/30 Days Discharge Instructions:  May shower and wash wound with dial antibacterial soap and water prior to dressing change. Cleanser: Wound Cleanser (Home Health) 1 x Per Day/30 Days Discharge Instructions: Cleanse the wound with wound cleanser prior to applying a clean dressing using gauze sponges, not tissue or cotton balls. Topical: Compounding T opical Antibiotics (Home Health) 1 x Per Day/30 Days Discharge Instructions: Apply T opical Antibiotics from [facility] daily once it arrives, applies over the [place]. Prim Dressing: Promogran Prisma Matrix, 4.34 (sq in) (silver collagen) (Home Health) 1 x Per Day/30 Days ary Discharge Instructions: Moisten collagen with saline or hydrogel directly to wound bed. apply over the [place] when arrives. Secondary Dressing: Woven Gauze Sponges 2x2 in (Home Health) 1 x Per Day/30 Days Discharge Instructions: Apply over primary dressing. ***Apply a rolled gauze under the right great toe. Secured With: Child psychotherapist, Sterile 2x75 (in/in) (Home Health) 1 x Per Day/30 Days Discharge Instructions: Secure with stretch gauze or use paper tape to secure the gauze. Secured With: Paper Tape, 2x10 (in/yd) (Home Health) 1 x Per Day/30 Days Discharge  Instructions: Secure dressing with tape as directed. WOUND #5: - T Great Wound Laterality: Right oe Cleanser: Soap and Water (Home Health) 1 x Per Day/30 Days Discharge Instructions: May shower and wash wound with dial antibacterial soap and water prior to dressing change. Cleanser: Wound Cleanser (Home Health) 1 x Per Day/30 Days Discharge Instructions: Cleanse the wound with wound cleanser prior to applying a clean dressing using gauze sponges, not tissue or cotton balls. Topical: Compounding T opical Antibiotics (Home Health) 1 x Per Day/30 Days Discharge Instructions: Apply T opical Antibiotics from [facility] daily once it arrives, applies over the [place]. Prim Dressing: Promogran Prisma Matrix, 4.34 (sq in) (silver collagen) (Home Health) 1 x Per Day/30 Days ary Discharge Instructions: Moisten collagen with saline or hydrogel directly to wound bed. apply over the [place] when arrives. Secondary Dressing: Woven Gauze Sponges 2x2 in (Home Health) 1 x Per Day/30 Days Discharge Instructions: Apply over primary dressing. may tape down with medipore tape. Secured With: Child psychotherapist, Sterile 2x75 (in/in) (Home Health) 1 x Per Day/30 Days Discharge Instructions: Secure with stretch gauze or use paper tape to secure the gauze. Secured With: Paper Tape, 2x10 (in/yd) (Home Health) 1 x Per Day/30 Days Discharge Instructions: Secure dressing with tape as directed. 1. [place] antibiotic and collagen 2. Follow-up in 1 month Electronic Signature(s) Signed: 04/22/2022 4:20:15 PM By: Kalman Shan DO Entered By: Kalman Shan on 04/22/2022 16:05:55 -------------------------------------------------------------------------------- HxROS Details Patient Name: Date of Service: [name], [name] 04/22/2022 3:00 PM Medical Record Number: [id number] Patient Account Number: [id number] Date of Birth/Sex: Treating RN: 03-May-1928 (88 y.o. [name] Primary Care Provider:  [name] Other Clinician: Referring Provider: Treating Provider/Extender: Theodosia Quay in Treatment: 12 Information Obtained From Patient Caregiver Eyes Medical History: Positive for: Cataracts Hematologic/Lymphatic Medical History: Positive for: Anemia Past Medical History Notes: Thrombocytopenia Respiratory Medical History: Negative for: Chronic Obstructive Pulmonary Disease (COPD) Cardiovascular Medical History: Positive for: Congestive Heart Failure; Hypotension; Peripheral Venous Disease Endocrine Medical History: Past Medical History Notes: Hyperthyroidism, Hypothyroidism Musculoskeletal Medical History: Positive for: 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 Neurologic Medical History: Positive for: Neuropathy HBO Extended History Items Eyes: Cataracts Immunizations Pneumococcal Vaccine: Received Pneumococcal Vaccination: No Implantable Devices None Family and Social History Cancer: Yes - Siblings; Diabetes: No; Heart Disease: Yes; Stroke: Yes - Mother; Former smoker - quit 2003; Marital Status - Widowed; Alcohol Use:  Never; Drug Use: No History; Caffeine Use: Never; Financial Concerns: No; Food, Clothing or Shelter Needs: No; Support System Lacking: No; Transportation Concerns: No Electronic Signature(s) Signed: 04/22/2022 4:20:15 PM By: Kalman Shan DO Signed: 04/22/2022 4:22:40 PM By: Deon Pilling RN, BSN Entered By: Kalman Shan on 04/22/2022 16:00:12 -------------------------------------------------------------------------------- SuperBill Details Patient Name: Date of Service: [name]. 04/22/2022 Medical Record Number: [id number] Patient Account Number: [id number] Date of Birth/Sex: Treating RN: [birth year] (88 y.o. [name], [name] Primary Care Provider: [name] Other Clinician: Referring Provider: Treating Provider/Extender: Theodosia Quay in Treatment:  12 Diagnosis Coding ICD-10 Codes Code Description [phone] Non-pressure chronic ulcer of other part of right foot with fat layer exposed L97.822 Non-pressure chronic ulcer of other part of left lower leg with fat layer exposed G90.09 Other idiopathic peripheral autonomic neuropathy M86.9 Osteomyelitis, unspecified I48.91 Unspecified atrial fibrillation Z79.01 Long term (current) use of anticoagulants I89.0 Lymphedema, not elsewhere classified I87.2 Venous insufficiency (chronic) (peripheral) J44.9 Chronic obstructive pulmonary disease, unspecified I73.9 Peripheral vascular disease, unspecified Facility Procedures Physician Procedures : CPT4 Code Description Modifier [id number] - WC PHYS LEVEL 3 - EST PT ICD-10 Diagnosis Description L97.512 Non-pressure chronic ulcer of other part of right foot with fat layer exposed I73.9 Peripheral vascular disease, unspecified M86.9  Osteomyelitis, unspecified I87.2 Venous insufficiency (chronic) (peripheral) Quantity: 1 Electronic Signature(s) Signed: 04/22/2022 4:20:15 PM By: Kalman Shan DO Entered By: Kalman Shan on 04/22/2022 16:06:29

## 2022-05-09 ENCOUNTER — Encounter (INDEPENDENT_AMBULATORY_CARE_PROVIDER_SITE_OTHER): Payer: Medicare HMO | Admitting: Ophthalmology

## 2022-05-09 DIAGNOSIS — Z961 Presence of intraocular lens: Secondary | ICD-10-CM

## 2022-05-09 DIAGNOSIS — H04123 Dry eye syndrome of bilateral lacrimal glands: Secondary | ICD-10-CM

## 2022-05-09 DIAGNOSIS — H26492 Other secondary cataract, left eye: Secondary | ICD-10-CM

## 2022-05-09 DIAGNOSIS — I10 Essential (primary) hypertension: Secondary | ICD-10-CM

## 2022-05-09 DIAGNOSIS — H353133 Nonexudative age-related macular degeneration, bilateral, advanced atrophic without subfoveal involvement: Secondary | ICD-10-CM

## 2022-05-09 DIAGNOSIS — H35033 Hypertensive retinopathy, bilateral: Secondary | ICD-10-CM

## 2022-05-09 NOTE — Progress Notes (Signed)
[name] ([id number]) Visit Report for 04/22/2022 Arrival Information Details Patient Name: Date of Service: [name], [place]. 04/22/2022 3:00 PM Medical Record Number: [id number] Patient Account Number: [id number] Date of Birth/Sex: Treating RN: 04-Feb-1928 (88 y.o. [name], [name] Primary Care Terrin Imparato: [name] Other Clinician: Referring Sahith Nurse: Treating Annalycia Done/Extender: [name] in Treatment: 12 Visit Information History Since Last Visit All ordered tests and consults were completed: Yes Patient Arrived: Wheel Chair Added or deleted any medications: Yes Arrival Time: 15:20 Any new allergies or adverse reactions: No Accompanied By: daughter Had a fall or experienced change in No Transfer Assistance: None activities of daily living that may affect Patient Identification Verified: Yes risk of falls: Secondary Verification Process Completed: Yes Signs or symptoms of abuse/neglect since last visito No Patient Requires Transmission-Based Precautions: No Hospitalized since last visit: No Patient Has Alerts: Yes Implantable device outside of the clinic excluding No Patient Alerts: Patient on Blood Thinner cellular tissue based products placed in the center since last visit: Has Dressing in Place as Prescribed: Yes Pain Present Now: No Notes Per daughter patient went to [facility] the doctor adjusted the BP and fluid pill will do blood work the next two weeks decide if patient can proceed with vascular procedure. Electronic Signature(s) Signed: 04/22/2022 4:22:40 PM By: Shawn Stall RN, BSN Entered By: Shawn Stall on 04/22/2022 15:27:19 -------------------------------------------------------------------------------- Clinic Level of Care Assessment Details Patient Name: Date of Service: [name]. 04/22/2022 3:00 PM Medical Record Number: [id number] Patient Account Number: [id number] Date of Birth/Sex: Treating  RN: [birth year] (88 y.o. [name], [name] Primary Care Tenita Cue: [name] Other Clinician: Referring Junell Cullifer: Treating Chaun Uemura/Extender: [name] in Treatment: 12 Clinic Level of Care Assessment Items TOOL 4 Quantity Score X- 1 0 Use when only an EandM is performed on FOLLOW-UP visit ASSESSMENTS - Nursing Assessment / Reassessment X- 1 10 Reassessment of Co-morbidities (includes updates in patient status) X- 1 5 Reassessment of Adherence to Treatment Plan ASSESSMENTS - Wound and Skin A ssessment / Reassessment []  - 0 Simple Wound Assessment / Reassessment - one wound X- 2 5 Complex Wound Assessment / Reassessment - multiple wounds X- 1 10 Dermatologic / Skin Assessment (not related to wound area) ASSESSMENTS - Focused Assessment X- 1 5 Circumferential Edema Measurements - multi extremities X- 1 10 Nutritional Assessment / Counseling / Intervention []  - 0 Lower Extremity Assessment (monofilament, tuning fork, pulses) []  - 0 Peripheral Arterial Disease Assessment (using hand held doppler) ASSESSMENTS - Ostomy and/or Continence Assessment and Care []  - 0 Incontinence Assessment and Management []  - 0 Ostomy Care Assessment and Management (repouching, etc.) PROCESS - Coordination of Care []  - 0 Simple Patient / Family Education for ongoing care X- 1 20 Complex (extensive) Patient / Family Education for ongoing care X- 1 10 Staff obtains , Records, T Results / Process Orders est X- 1 10 Staff telephones HHA, Nursing Homes / Clarify orders / etc []  - 0 Routine Transfer to another Facility (non-emergent condition) []  - 0 Routine Hospital Admission (non-emergent condition) []  - 0 New Admissions / / Ordering NPWT Apligraf, etc. , []  - 0 Emergency Hospital Admission (emergent condition) []  - 0 Simple Discharge Coordination X- 1 15 Complex (extensive) Discharge Coordination PROCESS - Special Needs []   - 0 Pediatric / Minor Patient Management []  - 0 Isolation Patient Management []  - 0 Hearing / Language / Visual special needs []  - 0 Assessment of Community assistance (transportation,  D/C planning, etc.) []  - 0 Additional assistance / Altered mentation []  - 0 Support Surface(s) Assessment (bed, cushion, seat, etc.) INTERVENTIONS - Wound Cleansing / Measurement []  - 0 Simple Wound Cleansing - one wound X- 2 5 Complex Wound Cleansing - multiple wounds X- 1 5 Wound Imaging (photographs - any number of wounds) []  - 0 Wound Tracing (instead of photographs) []  - 0 Simple Wound Measurement - one wound X- 2 5 Complex Wound Measurement - multiple wounds INTERVENTIONS - Wound Dressings X - Small Wound Dressing one or multiple wounds 2 10 []  - 0 Medium Wound Dressing one or multiple wounds []  - 0 Large Wound Dressing one or multiple wounds []  - 0 Application of Medications - topical []  - 0 Application of Medications - injection INTERVENTIONS - Miscellaneous []  - 0 External ear exam []  - 0 Specimen Collection (cultures, biopsies, blood, body fluids, etc.) []  - 0 Specimen(s) / Culture(s) sent or taken to Lab for analysis []  - 0 Patient Transfer (multiple staff / / Similar devices) []  - 0 Simple Staple / Suture removal (25 or less) []  - 0 Complex Staple / Suture removal (26 or more) []  - 0 Hypo / Hyperglycemic Management (close monitor of Blood Glucose) []  - 0 Ankle / Brachial Index (ABI) - do not check if billed separately X- 1 5 Vital Signs Has the patient been seen at the hospital within the last three years: Yes Total Score: 155 Level Of Care: New/Established - Level 4 Electronic Signature(s) Signed: 04/22/2022 4:22:40 PM By: RN, BSN Entered By: on 04/22/2022 15:49:55 -------------------------------------------------------------------------------- Encounter Discharge Information Details Patient Name: Date of Service: . 04/22/2022 3:00 PM Medical Record Number: Patient Account Number: Date of Birth/Sex: Treating RN: [birth year] (88 y.o. Primary Care Melvern Ramone: Other Clinician: Referring Rosette Bellavance: Treating Chania Kochanski/Extender: Nurse, adult in Treatment: 12 Encounter Discharge Information Items Discharge Condition: Stable Ambulatory Status: Wheelchair Discharge Destination: Home Transportation: Private Auto Accompanied By: daughter Schedule Follow-up Appointment: Yes Clinical Summary of Care: Electronic Signature(s) Signed: 04/22/2022 4:22:40 PM By: RN, BSN Entered By: on 04/22/2022 15:50:32 -------------------------------------------------------------------------------- Lower Extremity Assessment Details Patient Name: Date of Service: 04/24/2022. 04/22/2022 3:00 PM Medical Record Number: Shawn Stall Patient Account Number: 04/24/2022 Date of Birth/Sex: Treating RN: 1928-08-05 (88 y.o. [id number], [id number] Primary Care Jamil Castillo: 05/11/1928 Other Clinician: Referring Jalan Bodi: Treating Bernetta Sutley/Extender: 99 Weeks in Treatment: 12 Edema Assessment Assessed: [name]: No] [name]: Yes] Edema: [Left: Yes] [Right: Yes] Calf Left: Right: Point of Measurement: 30 cm From Medial Instep 41 cm Ankle Left: Right: Point of Measurement: 9 cm From Medial Instep 23 cm Vascular Assessment Pulses: Dorsalis Pedis Palpable: [Right:Yes] Electronic Signature(s) Signed: 04/22/2022 4:22:40 PM By: 04/24/2022 RN, BSN Entered By: Shawn Stall on 04/22/2022 15:30:16 -------------------------------------------------------------------------------- Multi Wound Chart Details Patient Name: Date of Service: 04/24/2022. 04/22/2022 3:00 PM Medical Record Number: 04/24/2022 Patient Account Number: [id number] Date of Birth/Sex: Treating RN: [birth year] (88 y.o. 99,  [name] Primary Care Zenith Kercheval: [name] Other Clinician: Referring Janith Nielson: Treating Alean Kromer/Extender: [name] in Treatment: 12 Vital Signs Height(in): 62 Pulse(bpm): 63 Weight(lbs): 181 Blood Pressure(mmHg): 137/72 Body Mass Index(BMI): 33.1 Temperature(F): 98.7 Respiratory Rate(breaths/min): 20 Photos: [N/A:N/A] Right Amputation Site - Toe Right T Great oe N/A Wound Location: Surgical Injury Shear/Friction N/A Wounding Event: Open Surgical Wound Abrasion N/A Primary Etiology: Cataracts, Anemia, Congestive  Heart Cataracts, Anemia, Congestive Heart N/A Comorbid History: Failure, Hypotension, Peripheral Failure, Hypotension, Peripheral Venous Disease, Osteomyelitis, Venous Disease, Osteomyelitis, Neuropathy Neuropathy 11/17/2021 02/06/2022 N/A Date Acquired: 12 10 N/A Weeks of Treatment: Open Open N/A Wound Status: No No N/A Wound Recurrence: 0.5x0.3x0.5 0.3x0.4x0.2 N/A Measurements L x W x D (cm) 0.118 0.094 N/A A (cm) : rea 0.059 0.019 N/A Volume (cm) : 72.70% 75.60% N/A % Reduction in Area: 72.70% 50.00% N/A % Reduction in Volume: Full Thickness Without Exposed Full Thickness Without Exposed N/A Classification: Support Structures Support Structures Medium Medium N/A Exudate Amount: Serosanguineous Serosanguineous N/A Exudate Type: red, brown red, brown N/A Exudate Color: Epibole Distinct, outline attached N/A Wound Margin: Medium (34-66%) Medium (34-66%) N/A Granulation Amount: Red Pink, Pale N/A Granulation Quality: Medium (34-66%) Medium (34-66%) N/A Necrotic Amount: Fat Layer (Subcutaneous Tissue): Yes Fat Layer (Subcutaneous Tissue): Yes N/A Exposed Structures: Fascia: No Fascia: No Tendon: No Tendon: No Muscle: No Muscle: No Joint: No Joint: No Bone: No Bone: No Small (1-33%) Small (1-33%) N/A Epithelialization: Treatment Notes Wound #3 (Amputation Site - Toe) Wound Laterality:  Right Cleanser Soap and Water Discharge Instruction: May shower and wash wound with dial antibacterial soap and water prior to dressing change. Wound Cleanser Discharge Instruction: Cleanse the wound with wound cleanser prior to applying a clean dressing using gauze sponges, not tissue or cotton balls. Peri-Wound Care Topical Compounding T opical Antibiotics Discharge Instruction: Apply T opical Antibiotics from [facility] daily once it arrives, applies over the [place]. Primary Dressing Promogran Prisma Matrix, 4.34 (sq in) (silver collagen) Discharge Instruction: Moisten collagen with saline or hydrogel directly to wound bed. apply over the [place] when arrives. Secondary Dressing Woven Gauze Sponges 2x2 in Discharge Instruction: Apply over primary dressing. ***Apply a rolled gauze under the right great toe. Secured With Conforming Stretch Gauze Bandage, Sterile 2x75 (in/in) Discharge Instruction: Secure with stretch gauze or use paper tape to secure the gauze. Paper Tape, 2x10 (in/yd) Discharge Instruction: Secure dressing with tape as directed. Compression Wrap Compression Stockings Add-Ons Wound #5 (Toe Great) Wound Laterality: Right Cleanser Soap and Water Discharge Instruction: May shower and wash wound with dial antibacterial soap and water prior to dressing change. Wound Cleanser Discharge Instruction: Cleanse the wound with wound cleanser prior to applying a clean dressing using gauze sponges, not tissue or cotton balls. Peri-Wound Care Topical Compounding T opical Antibiotics Discharge Instruction: Apply T opical Antibiotics from [facility] daily once it arrives, applies over the [place]. Primary Dressing Promogran Prisma Matrix, 4.34 (sq in) (silver collagen) Discharge Instruction: Moisten collagen with saline or hydrogel directly to wound bed. apply over the [place] when arrives. Secondary Dressing Woven Gauze Sponges 2x2 in Discharge Instruction:  Apply over primary dressing. may tape down with medipore tape. Secured With Conforming Stretch Gauze Bandage, Sterile 2x75 (in/in) Discharge Instruction: Secure with stretch gauze or use paper tape to secure the gauze. Paper Tape, 2x10 (in/yd) Discharge Instruction: Secure dressing with tape as directed. Compression Wrap Compression Stockings Add-Ons Electronic Signature(s) Signed: 04/22/2022 4:20:15 PM By: Kalman Shan DO Signed: 04/22/2022 4:22:40 PM By: Deon Pilling RN, BSN Entered By: Kalman Shan on 04/22/2022 15:58:48 -------------------------------------------------------------------------------- Multi-Disciplinary Care Plan Details Patient Name: Date of Service: [name]. 04/22/2022 3:00 PM Medical Record Number: [id number] Patient Account Number: [id number] Date of Birth/Sex: Treating RN: 04-Apr-1928 (88 y.o. [name] Primary Care Loy Little: [name] Other Clinician: Referring Jolly Carlini: Treating Diany Formosa/Extender: Theodosia Quay in Treatment: 12 Active Inactive Nutrition Nursing Diagnoses: Potential for  alteratiion in Nutrition/Potential for imbalanced nutrition Goals: Patient/caregiver agrees to and verbalizes understanding of need to use nutritional supplements and/or vitamins as prescribed Date Initiated: 01/28/2022 Target Resolution Date: 06/06/2022 Goal Status: Active Interventions: Assess patient nutrition upon admission and as needed per policy Provide education on nutrition Treatment Activities: Patient referred to Primary Care Physician for further nutritional evaluation : 01/28/2022 Notes: Pain, Acute or Chronic Nursing Diagnoses: Pain, acute or chronic: actual or potential Potential alteration in comfort, pain Goals: Patient will verbalize adequate pain control and receive pain control interventions during procedures as needed Date Initiated: 01/28/2022 Target Resolution Date: 06/06/2022 Goal Status:  Active Interventions: Encourage patient to take pain medications as prescribed Provide education on pain management Treatment Activities: Administer pain control measures as ordered : 01/28/2022 Notes: Wound/Skin Impairment Nursing Diagnoses: Knowledge deficit related to ulceration/compromised skin integrity Goals: Patient/caregiver will verbalize understanding of skin care regimen Date Initiated: 01/28/2022 Target Resolution Date: 06/06/2022 Goal Status: Active Interventions: Assess patient/caregiver ability to perform ulcer/skin care regimen upon admission and as needed Assess ulceration(s) every visit Provide education on ulcer and skin care Treatment Activities: Skin care regimen initiated : 01/28/2022 Topical wound management initiated : 01/28/2022 Notes: Electronic Signature(s) Signed: 04/22/2022 4:22:40 PM By: Deon Pilling RN, BSN Entered By: Deon Pilling on 04/22/2022 15:35:17 -------------------------------------------------------------------------------- Pain Assessment Details Patient Name: Date of Service: [name]. 04/22/2022 3:00 PM Medical Record Number: [id number] Patient Account Number: [id number] Date of Birth/Sex: Treating RN: Dec 16, 1927 (88 y.o. [name] Primary Care Lydia Toren: [name] Other Clinician: Referring Jaylee Freeze: Treating Keylee Shrestha/Extender: Theodosia Quay in Treatment: 12 Active Problems Location of Pain Severity and Description of Pain Patient Has Paino No Site Locations Rate the pain. Current Pain Level: 0 Pain Management and Medication Current Pain Management: Medication: No Cold Application: No Rest: No Massage: No Activity: No T.E.N.S.: No Heat Application: No Leg drop or elevation: No Is the Current Pain Management Adequate: Adequate How does your wound impact your activities of daily livingo Sleep: No Bathing: No Appetite: No Relationship With Others: No Bladder Continence:  No Emotions: No Bowel Continence: No Work: No Toileting: No Drive: No Dressing: No Hobbies: No Engineer, maintenance) Signed: 04/22/2022 4:22:40 PM By: Deon Pilling RN, BSN Entered By: Deon Pilling on 04/22/2022 15:27:40 -------------------------------------------------------------------------------- Patient/Caregiver Education Details Patient Name: Date of Service: [name] 8/15/2023andnbsp3:00 PM Medical Record Number: [id number] Patient Account Number: [id number] Date of Birth/Gender: Treating RN: [birth year] (88 y.o. [name] Primary Care Physician: [name] Other Clinician: Referring Physician: Treating Physician/Extender: Theodosia Quay in Treatment: 12 Education Assessment Education Provided To: Patient Education Topics Provided Nutrition: Handouts: Elevated Blood Sugars: How Do They Affect Wound Healing Methods: Explain/Verbal Responses: Reinforcements needed Electronic Signature(s) Signed: 04/22/2022 4:22:40 PM By: Deon Pilling RN, BSN Entered By: Deon Pilling on 04/22/2022 15:35:29 -------------------------------------------------------------------------------- Wound Assessment Details Patient Name: Date of Service: [name]. 04/22/2022 3:00 PM Medical Record Number: [id number] Patient Account Number: [id number] Date of Birth/Sex: Treating RN: 08/28/1928 (88 y.o. [name], Tammi Klippel Primary Care Flo Berroa: [name] Other Clinician: Referring Valen Mascaro: Treating Deshay Kirstein/Extender: Theodosia Quay in Treatment: 12 Wound Status Wound Number: 3 Primary Open Surgical Wound Etiology: Wound Location: Right Amputation Site - Toe Wound Open Wounding Event: Surgical Injury Status: Date Acquired: 11/17/2021 Comorbid Cataracts, Anemia, Congestive Heart Failure, Hypotension, Weeks Of Treatment: 12 History: Peripheral Venous Disease, Osteomyelitis, Neuropathy Clustered Wound:  No Photos Wound Measurements Length: (cm) 0.5 Width: (cm) 0.3 Depth: (cm) 0.5  Area: (cm) 0.118 Volume: (cm) 0.059 % Reduction in Area: 72.7% % Reduction in Volume: 72.7% Epithelialization: Small (1-33%) Tunneling: No Undermining: No Wound Description Classification: Full Thickness Without Exposed Support Structures Wound Margin: Epibole Exudate Amount: Medium Exudate Type: Serosanguineous Exudate Color: red, brown Foul Odor After Cleansing: No Slough/Fibrino Yes Wound Bed Granulation Amount: Medium (34-66%) Exposed Structure Granulation Quality: Red Fascia Exposed: No Necrotic Amount: Medium (34-66%) Fat Layer (Subcutaneous Tissue) Exposed: Yes Necrotic Quality: Adherent Slough Tendon Exposed: No Muscle Exposed: No Joint Exposed: No Bone Exposed: No Treatment Notes Wound #3 (Amputation Site - Toe) Wound Laterality: Right Cleanser Soap and Water Discharge Instruction: May shower and wash wound with dial antibacterial soap and water prior to dressing change. Wound Cleanser Discharge Instruction: Cleanse the wound with wound cleanser prior to applying a clean dressing using gauze sponges, not tissue or cotton balls. Peri-Wound Care Topical Compounding T opical Antibiotics Discharge Instruction: Apply T opical Antibiotics from [facility] daily once it arrives, applies over the [place]. Primary Dressing Promogran Prisma Matrix, 4.34 (sq in) (silver collagen) Discharge Instruction: Moisten collagen with saline or hydrogel directly to wound bed. apply over the [place] when arrives. Secondary Dressing Woven Gauze Sponges 2x2 in Discharge Instruction: Apply over primary dressing. ***Apply a rolled gauze under the right great toe. Secured With Conforming Stretch Gauze Bandage, Sterile 2x75 (in/in) Discharge Instruction: Secure with stretch gauze or use paper tape to secure the gauze. Paper Tape, 2x10 (in/yd) Discharge Instruction: Secure dressing with tape as  directed. Compression Wrap Compression Stockings Add-Ons Electronic Signature(s) Signed: 04/22/2022 4:22:40 PM By: Deon Pilling RN, BSN Signed: 05/09/2022 1:11:09 PM By: Rhae Hammock RN Entered By: Rhae Hammock on 04/22/2022 15:34:00 -------------------------------------------------------------------------------- Wound Assessment Details Patient Name: Date of Service: [name]. 04/22/2022 3:00 PM Medical Record Number: [id number] Patient Account Number: [id number] Date of Birth/Sex: Treating RN: 04-Mar-1928 (88 y.o. [name], [name] Primary Care Hailyn Zarr: [name] Other Clinician: Referring Tatiyana Foucher: Treating Sieanna Vanstone/Extender: Theodosia Quay in Treatment: 12 Wound Status Wound Number: 5 Primary Abrasion Etiology: Wound Location: Right T Great oe Wound Open Wounding Event: Shear/Friction Status: Date Acquired: 02/06/2022 Comorbid Cataracts, Anemia, Congestive Heart Failure, Hypotension, Weeks Of Treatment: 10 History: Peripheral Venous Disease, Osteomyelitis, Neuropathy Clustered Wound: No Photos Wound Measurements Length: (cm) 0.3 Width: (cm) 0.4 Depth: (cm) 0.2 Area: (cm) 0.094 Volume: (cm) 0.019 % Reduction in Area: 75.6% % Reduction in Volume: 50% Epithelialization: Small (1-33%) Tunneling: No Undermining: No Wound Description Classification: Full Thickness Without Exposed Support Structures Wound Margin: Distinct, outline attached Exudate Amount: Medium Exudate Type: Serosanguineous Exudate Color: red, brown Foul Odor After Cleansing: No Slough/Fibrino Yes Wound Bed Granulation Amount: Medium (34-66%) Exposed Structure Granulation Quality: Pink, Pale Fascia Exposed: No Necrotic Amount: Medium (34-66%) Fat Layer (Subcutaneous Tissue) Exposed: Yes Necrotic Quality: Adherent Slough Tendon Exposed: No Muscle Exposed: No Joint Exposed: No Bone Exposed: No Treatment Notes Wound #5 (Toe Great) Wound Laterality:  Right Cleanser Soap and Water Discharge Instruction: May shower and wash wound with dial antibacterial soap and water prior to dressing change. Wound Cleanser Discharge Instruction: Cleanse the wound with wound cleanser prior to applying a clean dressing using gauze sponges, not tissue or cotton balls. Peri-Wound Care Topical Compounding T opical Antibiotics Discharge Instruction: Apply T opical Antibiotics from [facility] daily once it arrives, applies over the [place]. Primary Dressing Promogran Prisma Matrix, 4.34 (sq in) (silver collagen) Discharge Instruction: Moisten collagen with saline or hydrogel directly to wound bed. apply over the [place] when arrives.  Secondary Dressing Woven Gauze Sponges 2x2 in Discharge Instruction: Apply over primary dressing. may tape down with medipore tape. Secured With Conforming Stretch Gauze Bandage, Sterile 2x75 (in/in) Discharge Instruction: Secure with stretch gauze or use paper tape to secure the gauze. Paper Tape, 2x10 (in/yd) Discharge Instruction: Secure dressing with tape as directed. Compression Wrap Compression Stockings Add-Ons Electronic Signature(s) Signed: 04/22/2022 4:22:40 PM By: Deon Pilling RN, BSN Signed: 05/09/2022 1:11:09 PM By: Rhae Hammock RN Entered By: Rhae Hammock on 04/22/2022 15:34:19 -------------------------------------------------------------------------------- Vitals Details Patient Name: Date of Service: [name]. 04/22/2022 3:00 PM Medical Record Number: [id number] Patient Account Number: [id number] Date of Birth/Sex: Treating RN: 10/05/1927 (88 y.o. [name], Tammi Klippel Primary Care Caylan Chenard: [name] Other Clinician: Referring Yoali Conry: Treating Nickia Boesen/Extender: Theodosia Quay in Treatment: 12 Vital Signs Time Taken: 15:27 Temperature (F): 98.7 Height (in): 62 Pulse (bpm): 63 Weight (lbs): 181 Respiratory Rate (breaths/min): 20 Body Mass Index  (BMI): 33.1 Blood Pressure (mmHg): 137/72 Reference Range: 80 - 120 mg / dl Electronic Signature(s) Signed: 04/22/2022 4:22:40 PM By: Deon Pilling RN, BSN Entered By: Deon Pilling on 04/22/2022 15:27:32

## 2022-05-20 ENCOUNTER — Encounter (HOSPITAL_BASED_OUTPATIENT_CLINIC_OR_DEPARTMENT_OTHER): Payer: Medicare HMO | Attending: Internal Medicine | Admitting: Internal Medicine

## 2022-05-20 DIAGNOSIS — I739 Peripheral vascular disease, unspecified: Secondary | ICD-10-CM | POA: Diagnosis not present

## 2022-05-20 DIAGNOSIS — I890 Lymphedema, not elsewhere classified: Secondary | ICD-10-CM | POA: Diagnosis not present

## 2022-05-20 DIAGNOSIS — L97822 Non-pressure chronic ulcer of other part of left lower leg with fat layer exposed: Secondary | ICD-10-CM | POA: Diagnosis not present

## 2022-05-20 DIAGNOSIS — G9009 Other idiopathic peripheral autonomic neuropathy: Secondary | ICD-10-CM | POA: Diagnosis not present

## 2022-05-20 DIAGNOSIS — G629 Polyneuropathy, unspecified: Secondary | ICD-10-CM | POA: Insufficient documentation

## 2022-05-20 DIAGNOSIS — M869 Osteomyelitis, unspecified: Secondary | ICD-10-CM | POA: Insufficient documentation

## 2022-05-20 DIAGNOSIS — E039 Hypothyroidism, unspecified: Secondary | ICD-10-CM | POA: Diagnosis not present

## 2022-05-20 DIAGNOSIS — I509 Heart failure, unspecified: Secondary | ICD-10-CM | POA: Insufficient documentation

## 2022-05-20 DIAGNOSIS — I872 Venous insufficiency (chronic) (peripheral): Secondary | ICD-10-CM | POA: Insufficient documentation

## 2022-05-20 DIAGNOSIS — L97512 Non-pressure chronic ulcer of other part of right foot with fat layer exposed: Secondary | ICD-10-CM | POA: Insufficient documentation

## 2022-05-20 NOTE — Progress Notes (Signed)
[name], Megan M. ([id number]) Visit Report for 05/20/2022 Chief Complaint Document Details Patient Name: Date of Service: [name], [name] 05/20/2022 1:45 PM Medical Record Number: [id number] Patient Account Number: [id number] Date of Birth/Sex: Treating RN: 09/16/1927 (88 y.o. [name]) [name], Megan Primary Care Provider: Mila [name], Megan Other Clinician: Referring Provider: Treating Provider/Extender: Megan RiisHoffman, Lorenia Hoston [name], [name] in Treatment: 16 Information Obtained from: Patient Chief Complaint 01/28/2022; right second toe amputation site dehiscence and bilateral lower extremity wounds. Electronic Signature(s) Signed: 05/20/2022 2:17:28 PM By: Megan CorwinHoffman, [name] Entered By: Megan CorwinHoffman, Domanique Huesman on 05/20/2022 14:12:54 -------------------------------------------------------------------------------- HPI Details Patient Name: Date of Service: Megan ChickHILL, [name] 05/20/2022 1:45 PM Medical Record Number: [id number] Patient Account Number: [id number] Date of Birth/Sex: Treating RN: 07/06/1928 (88 y.o. [name]) [name], Megan Primary Care Provider: Mila [name], Megan Other Clinician: Referring Provider: Treating Provider/Extender: Megan RiisHoffman, Elenna Spratling [name], [name] in Treatment: 16 History of Present Illness HPI Description: Admission 01/28/2022 Ms. Megan HighlandHattie [name] is a 88 year old female with a past medical history of idiopathic peripheral neuropathy status post amputation to the second right toe secondary to osteomyelitis, COPD and A-fib on Coumadin the presents to the clinic for a 5073-month history of nonhealing ulcer to a previous amputation site on the second right toe. She states she has tried Medihoney and silver alginate in the past to this area with little benefit. She also has 2 small areas limited to skin breakdown to her lower extremities bilaterally. She has chronic venous insufficiency but not has not been wearing her compression stockings. She states she bumped her legs against an object and not  so the wound started. She has been using Medihoney to the sites. She denies signs of infection. 6/2; patient presents for follow-up. She had an x-ray of her right foot done at last clinic visit and this was negative for evidence of osteomyelitis. She also had a wound culture done that showed extra high levels of Staph aureus. I recommended Keystone antibiotics for this and this was ordered. She had ABIs with TBI's done as well that showed monophasic waveforms to the right foot with TBI of 0 and an ABI of 0.52. Urgent referral was made to vein and vascular and she saw Dr. Durwin Noraixon on 6/1, yesterday and he recommended an arteriogram. This is scheduled for 6/16. Patient also reports a new wound to the right great toe. This is a blister that has ruptured. She also reports increased erythema to the toe. 6/6; the patient was worked in urgently today at the insistence of her daughter out of concern for a new wound on the lateral part of the plantar right great toe. She has her original postsurgical wound after the amputation of the right second toe, she has a wound on the medial part of the right great toe. The patient is apparently going for an angiogram by Dr. Durwin Noraixon in 2 [name] time. 6/13; patient presents for follow-up. She has been using bacitracin to the abrasion on the right great toe. She has been using collagen and Keystone antibiotic to the amputation site. She has no issues or complaints today. She denies signs of infection. 6/22; patient presents for follow-up. She states that her abdominal aortogram was canceled due to her renal function. She has been using Keystone antibiotics to the amputation site and Medihoney to the right great toe wound. At the pace of the right great toe she has a slitlike open area that she thinks was caused by the tape from the dressing. 6/29; patient presents for follow-up. She has been  using Keystone antibiotics to the amputation site along with collagen. She has been using  Medihoney to the right great toe wound. She has no other wounds. She denies signs of infection. 7/13; patient presents for follow-up. She has been using Keystone antibiotics and collagen to the wound sites. She currently denies signs of infection. She states she is scheduled to see me nephrology next month. 7/20; patient presents for follow-up. She continue Keystone antibiotics and collagen to the wound sites. She has no issues or complaints today. 8/1; patient presents for follow up. She continues to use keystone antibiotics and collagen to the wound sites. She has no issues or complaints today. 8/15; patient presents for follow-up. She has been using Keystone antibiotics and collagen to the wound sites. She followed up with her nephrologist who made medication changes. She is supposed to get a repeat BMP in 2 [name]. Her decrease in renal function was a limiting factor in obtaining an arteriogram for potential intervention for revascularization. She currently denies signs of infection. 9/2; patient presents for follow-up. She has been using Keystone antibiotic and collagen to the wound beds. She has no issues or complaints today. Reports there has been improvement in kidney function however not cleared to have her arteriogram just yet. Electronic Signature(s) Signed: 05/20/2022 2:17:28 PM By: Megan Corwin [name] Entered By: [name] on 05/20/2022 14:13:26 -------------------------------------------------------------------------------- Physical Exam Details Patient Name: Date of Service: [name]. 05/20/2022 1:45 PM Medical Record Number: [id number] Patient Account Number: [id number] Date of Birth/Sex: Treating RN: 01-Mar-1928 (88 y.o. Megan Silence Primary Care Provider: [name] Other Clinician: Referring Provider: Treating Provider/Extender: [name] in Treatment: 16 Constitutional respirations regular, non-labored and within target range  for patient.. Cardiovascular 2+ dorsalis pedis/posterior tibialis pulses. Psychiatric pleasant and cooperative. Notes Right foot: T the second digit there is an amputation site that is open with pale granulation tissue. Does not probe to bone. No surrounding signs of infection. o T the lateral aspect of the great toe there is an open wound with pale granulation tissue. No surrounding signs of infection to either wound bed. o Electronic Signature(s) Signed: 05/20/2022 2:17:28 PM By: Megan Corwin [name] Entered By: [name] on 05/20/2022 14:13:52 -------------------------------------------------------------------------------- Physician Orders Details Patient Name: Date of Service: [name]. 05/20/2022 1:45 PM Medical Record Number: [id number] Patient Account Number: [id number] Date of Birth/Sex: Treating RN: Apr 24, 1928 (88 y.o. Roel Cluck Primary Care Provider: [name] Other Clinician: Referring Provider: Treating Provider/Extender: [name] in Treatment: [phone] Verbal / Phone Orders: No Diagnosis Coding ICD-10 Coding Code Description [phone] Non-pressure chronic ulcer of other part of right foot with fat layer exposed L97.822 Non-pressure chronic ulcer of other part of left lower leg with fat layer exposed G90.09 Other idiopathic peripheral autonomic neuropathy M86.9 Osteomyelitis, unspecified I48.91 Unspecified atrial fibrillation Z79.01 Long term (current) use of anticoagulants I89.0 Lymphedema, not elsewhere classified I87.2 Venous insufficiency (chronic) (peripheral) J44.9 Chronic obstructive pulmonary disease, unspecified I73.9 Peripheral vascular disease, unspecified Follow-up Appointments Return appointment in 1 month. - with Dr. Mikey Bussing and [name] Other: - Follow up with [facility]. Anesthetic (In clinic) Topical Lidocaine 5% applied to wound bed (In clinic) Topical Lidocaine 5% applied to wound bed (In clinic)  Topical Lidocaine 4% applied to wound bed (In clinic) Topical Lidocaine 4% applied to wound bed Edema Control - Lymphedema / SCD / Other Elevate legs to the level of the heart or above for 30 minutes daily and/or when sitting,  a frequency of: - throughout the day. Avoid standing for long periods of time. Home Health No change in wound care orders this week; continue Home Health for wound care. May utilize formulary equivalent dressing for wound treatment orders unless otherwise specified. - Home health weekly dressing changes. All other days daughter to change. All wounds keystone topical antibiotics and Prisma. Other Home Health Orders/Instructions: - [facility]. Wound Treatment Wound #3 - Amputation Site - Toe Wound Laterality: Right Cleanser: Soap and Water (Home Health) 1 x Per Day/30 Days Discharge Instructions: May shower and wash wound with dial antibacterial soap and water prior to dressing change. Cleanser: Wound Cleanser (Home Health) 1 x Per Day/30 Days Discharge Instructions: Cleanse the wound with wound cleanser prior to applying a clean dressing using gauze sponges, not tissue or cotton balls. Topical: Compounding T opical Antibiotics (Home Health) 1 x Per Day/30 Days Discharge Instructions: Apply T opical Antibiotics from [facility] daily once it arrives, applies over the [place]. Prim Dressing: Promogran Prisma Matrix, 4.34 (sq in) (silver collagen) (Home Health) 1 x Per Day/30 Days ary Discharge Instructions: Moisten collagen with saline or hydrogel directly to wound bed. apply over the [place] when arrives. Secondary Dressing: Woven Gauze Sponges 2x2 in (Home Health) 1 x Per Day/30 Days Discharge Instructions: Apply over primary dressing. ***Apply a rolled gauze under the right great toe. Secured With: Insurance underwriter, Sterile 2x75 (in/in) (Home Health) 1 x Per Day/30 Days Discharge Instructions: Secure with stretch gauze or use paper tape  to secure the gauze. Secured With: Paper Tape, 2x10 (in/yd) (Home Health) 1 x Per Day/30 Days Discharge Instructions: Secure dressing with tape as directed. Wound #5 - T Great oe Wound Laterality: Right Cleanser: Soap and Water (Home Health) 1 x Per Day/30 Days Discharge Instructions: May shower and wash wound with dial antibacterial soap and water prior to dressing change. Cleanser: Wound Cleanser (Home Health) 1 x Per Day/30 Days Discharge Instructions: Cleanse the wound with wound cleanser prior to applying a clean dressing using gauze sponges, not tissue or cotton balls. Topical: Compounding T opical Antibiotics (Home Health) 1 x Per Day/30 Days Discharge Instructions: Apply T opical Antibiotics from [facility] daily once it arrives, applies over the [place]. Prim Dressing: Promogran Prisma Matrix, 4.34 (sq in) (silver collagen) (Home Health) 1 x Per Day/30 Days ary Discharge Instructions: Moisten collagen with saline or hydrogel directly to wound bed. apply over the [place] when arrives. Secondary Dressing: Woven Gauze Sponges 2x2 in (Home Health) 1 x Per Day/30 Days Discharge Instructions: Apply over primary dressing. may tape down with medipore tape. Secured With: Insurance underwriter, Sterile 2x75 (in/in) (Home Health) 1 x Per Day/30 Days Discharge Instructions: Secure with stretch gauze or use paper tape to secure the gauze. Secured With: Paper Tape, 2x10 (in/yd) (Home Health) 1 x Per Day/30 Days Discharge Instructions: Secure dressing with tape as directed. Electronic Signature(s) Signed: 05/20/2022 2:17:28 PM By: Megan Corwin [name] Entered By: [name] on 05/20/2022 14:14:03 -------------------------------------------------------------------------------- Problem List Details Patient Name: Date of Service: [name]. 05/20/2022 1:45 PM Medical Record Number: [id number] Patient Account Number: [id number] Date of Birth/Sex: Treating RN: 1927/11/08  (88 y.o. Roel Cluck Primary Care Provider: [name] Other Clinician: Referring Provider: Treating Provider/Extender: [name] in Treatment: 16 Active Problems ICD-10 Encounter Code Description Active Date MDM Diagnosis L97.512 Non-pressure chronic ulcer of other part of right foot with fat layer exposed 01/28/2022 No Yes L97.822 Non-pressure chronic  ulcer of other part of left lower leg with fat layer exposed5/23/2023 No Yes G90.09 Other idiopathic peripheral autonomic neuropathy 01/28/2022 No Yes M86.9 Osteomyelitis, unspecified 01/28/2022 No Yes I48.91 Unspecified atrial fibrillation 01/28/2022 No Yes Z79.01 Long term (current) use of anticoagulants 01/28/2022 No Yes I89.0 Lymphedema, not elsewhere classified 01/28/2022 No Yes I87.2 Venous insufficiency (chronic) (peripheral) 01/28/2022 No Yes J44.9 Chronic obstructive pulmonary disease, unspecified 01/28/2022 No Yes I73.9 Peripheral vascular disease, unspecified 03/27/2022 No Yes Inactive Problems ICD-10 Code Description Active Date Inactive Date L97.812 Non-pressure chronic ulcer of other part of right lower leg with fat layer exposed 01/28/2022 01/28/2022 Resolved Problems Electronic Signature(s) Signed: 05/20/2022 2:17:28 PM By: Megan Corwin [name] Entered By: [name] on 05/20/2022 14:12:43 -------------------------------------------------------------------------------- Progress Note Details Patient Name: Date of Service: [name]. 05/20/2022 1:45 PM Medical Record Number: [id number] Patient Account Number: [id number] Date of Birth/Sex: Treating RN: 11/03/1927 (88 y.o. Megan Silence Primary Care Provider: [name] Other Clinician: Referring Provider: Treating Provider/Extender: [name] in Treatment: 16 Subjective Chief Complaint Information obtained from Patient 01/28/2022; right second toe amputation site dehiscence and bilateral  lower extremity wounds. History of Present Illness (HPI) Admission 01/28/2022 Ms. Tatisha Cerino is a 88 year old female with a past medical history of idiopathic peripheral neuropathy status post amputation to the second right toe secondary to osteomyelitis, COPD and A-fib on Coumadin the presents to the clinic for a 71-month history of nonhealing ulcer to a previous amputation site on the second right toe. She states she has tried Medihoney and silver alginate in the past to this area with little benefit. She also has 2 small areas limited to skin breakdown to her lower extremities bilaterally. She has chronic venous insufficiency but not has not been wearing her compression stockings. She states she bumped her legs against an object and not so the wound started. She has been using Medihoney to the sites. She denies signs of infection. 6/2; patient presents for follow-up. She had an x-ray of her right foot done at last clinic visit and this was negative for evidence of osteomyelitis. She also had a wound culture done that showed extra high levels of Staph aureus. I recommended Keystone antibiotics for this and this was ordered. She had ABIs with TBI's done as well that showed monophasic waveforms to the right foot with TBI of 0 and an ABI of 0.52. Urgent referral was made to vein and vascular and she saw Dr. Durwin Nora on 6/1, yesterday and he recommended an arteriogram. This is scheduled for 6/16. Patient also reports a new wound to the right great toe. This is a blister that has ruptured. She also reports increased erythema to the toe. 6/6; the patient was worked in urgently today at the insistence of her daughter out of concern for a new wound on the lateral part of the plantar right great toe. She has her original postsurgical wound after the amputation of the right second toe, she has a wound on the medial part of the right great toe. The patient is apparently going for an angiogram by Dr. Durwin Nora in 2  [name] time. 6/13; patient presents for follow-up. She has been using bacitracin to the abrasion on the right great toe. She has been using collagen and Keystone antibiotic to the amputation site. She has no issues or complaints today. She denies signs of infection. 6/22; patient presents for follow-up. She states that her abdominal aortogram was canceled due to her renal function. She has been using  Keystone antibiotics to the amputation site and Medihoney to the right great toe wound. At the pace of the right great toe she has a slitlike open area that she thinks was caused by the tape from the dressing. 6/29; patient presents for follow-up. She has been using Keystone antibiotics to the amputation site along with collagen. She has been using Medihoney to the right great toe wound. She has no other wounds. She denies signs of infection. 7/13; patient presents for follow-up. She has been using Keystone antibiotics and collagen to the wound sites. She currently denies signs of infection. She states she is scheduled to see me nephrology next month. 7/20; patient presents for follow-up. She continue Keystone antibiotics and collagen to the wound sites. She has no issues or complaints today. 8/1; patient presents for follow up. She continues to use keystone antibiotics and collagen to the wound sites. She has no issues or complaints today. 8/15; patient presents for follow-up. She has been using Keystone antibiotics and collagen to the wound sites. She followed up with her nephrologist who made medication changes. She is supposed to get a repeat BMP in 2 [name]. Her decrease in renal function was a limiting factor in obtaining an arteriogram for potential intervention for revascularization. She currently denies signs of infection. 9/2; patient presents for follow-up. She has been using Keystone antibiotic and collagen to the wound beds. She has no issues or complaints today. Reports there has been  improvement in kidney function however not cleared to have her arteriogram just yet. Patient History Information obtained from Patient, Caregiver. Family History Cancer - Siblings, Heart Disease, Stroke - Mother, No family history of Diabetes. Social History Former smoker - quit 2003, Marital Status - Widowed, Alcohol Use - Never, Drug Use - No History, Caffeine Use - Never. Medical History Eyes Patient has history of Cataracts Hematologic/Lymphatic Patient has history of Anemia Respiratory Denies history of Chronic Obstructive Pulmonary Disease (COPD) Cardiovascular Patient has history of Congestive Heart Failure, Hypotension, Peripheral Venous Disease Musculoskeletal Patient has history of 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 Neurologic Patient has history of Neuropathy Medical A Surgical History Notes nd Hematologic/Lymphatic Thrombocytopenia Endocrine Hyperthyroidism, Hypothyroidism Objective Constitutional respirations regular, non-labored and within target range for patient.. Vitals Time Taken: 1:53 PM, Height: 62 in, Weight: 181 lbs, BMI: 33.1, Temperature: 98.2 F, Pulse: 64 bpm, Respiratory Rate: 18 breaths/min, Blood Pressure: 129/68 mmHg. Cardiovascular 2+ dorsalis pedis/posterior tibialis pulses. Psychiatric pleasant and cooperative. General Notes: Right foot: T the second digit there is an amputation site that is open with pale granulation tissue. Does not probe to bone. No surrounding o signs of infection. T the lateral aspect of the great toe there is an open wound with pale granulation tissue. No surrounding signs of infection to either wound o bed. Integumentary (Hair, Skin) Wound #3 status is Open. Original cause of wound was Surgical Injury. The date acquired was: 11/17/2021. The wound has been in treatment 16 [name]. The wound is located on the Right Amputation Site - T The wound measures 0.4cm length x 0.4cm width x 0.3cm depth; 0.126cm^2 area  and 0.038cm^3 volume. oe. There is Fat Layer (Subcutaneous Tissue) exposed. There is no tunneling or undermining noted. There is a medium amount of serosanguineous drainage noted. The wound margin is epibole. There is medium (34-66%) pink, pale granulation within the wound bed. There is a medium (34-66%) amount of necrotic tissue within the wound bed including Adherent Slough. Wound #5 status is Open. Original cause of wound  was Shear/Friction. The date acquired was: 02/06/2022. The wound has been in treatment 14 [name]. The wound is located on the Right T Great. The wound measures 0.3cm length x 0.4cm width x 0.2cm depth; 0.094cm^2 area and 0.019cm^3 volume. There is Fat oe Layer (Subcutaneous Tissue) exposed. There is no tunneling or undermining noted. There is a medium amount of serosanguineous drainage noted. The wound margin is distinct with the outline attached to the wound base. There is large (67-100%) pink granulation within the wound bed. There is no necrotic tissue within the wound bed. Assessment Active Problems ICD-10 Non-pressure chronic ulcer of other part of right foot with fat layer exposed Non-pressure chronic ulcer of other part of left lower leg with fat layer exposed Other idiopathic peripheral autonomic neuropathy Osteomyelitis, unspecified Unspecified atrial fibrillation Long term (current) use of anticoagulants Lymphedema, not elsewhere classified Venous insufficiency (chronic) (peripheral) Chronic obstructive pulmonary disease, unspecified Peripheral vascular disease, unspecified Patient's wounds are stable. I recommended continuing Keystone and collagen. Follow-up in 1 month. Patient has a history of osteomyelitis to the wound site in the setting of peripheral vascular disease without the ability for revascularization. Wounds will likely not heal. Plan Follow-up Appointments: Return appointment in 1 month. - with Dr. Mikey Bussing and [name] Other: - Follow up with  [facility]. Anesthetic: (In clinic) Topical Lidocaine 5% applied to wound bed (In clinic) Topical Lidocaine 5% applied to wound bed (In clinic) Topical Lidocaine 4% applied to wound bed (In clinic) Topical Lidocaine 4% applied to wound bed Edema Control - Lymphedema / SCD / Other: Elevate legs to the level of the heart or above for 30 minutes daily and/or when sitting, a frequency of: - throughout the day. Avoid standing for long periods of time. Home Health: No change in wound care orders this week; continue Home Health for wound care. May utilize formulary equivalent dressing for wound treatment orders unless otherwise specified. - Home health weekly dressing changes. All other days daughter to change. All wounds keystone topical antibiotics and Prisma. Other Home Health Orders/Instructions: - [facility]. WOUND #3: - Amputation Site - T oe Wound Laterality: Right Cleanser: Soap and Water (Home Health) 1 x Per Day/30 Days Discharge Instructions: May shower and wash wound with dial antibacterial soap and water prior to dressing change. Cleanser: Wound Cleanser (Home Health) 1 x Per Day/30 Days Discharge Instructions: Cleanse the wound with wound cleanser prior to applying a clean dressing using gauze sponges, not tissue or cotton balls. Topical: Compounding T opical Antibiotics (Home Health) 1 x Per Day/30 Days Discharge Instructions: Apply T opical Antibiotics from [facility] daily once it arrives, applies over the [place]. Prim Dressing: Promogran Prisma Matrix, 4.34 (sq in) (silver collagen) (Home Health) 1 x Per Day/30 Days ary Discharge Instructions: Moisten collagen with saline or hydrogel directly to wound bed. apply over the [place] when arrives. Secondary Dressing: Woven Gauze Sponges 2x2 in (Home Health) 1 x Per Day/30 Days Discharge Instructions: Apply over primary dressing. ***Apply a rolled gauze under the right great toe. Secured With: Doctor, hospital, Sterile 2x75 (in/in) (Home Health) 1 x Per Day/30 Days Discharge Instructions: Secure with stretch gauze or use paper tape to secure the gauze. Secured With: Paper T ape, 2x10 (in/yd) (Home Health) 1 x Per Day/30 Days Discharge Instructions: Secure dressing with tape as directed. WOUND #5: - T Great Wound Laterality: Right oe Cleanser: Soap and Water (Home Health) 1 x Per Day/30 Days Discharge Instructions: May shower and wash wound with  dial antibacterial soap and water prior to dressing change. Cleanser: Wound Cleanser (Home Health) 1 x Per Day/30 Days Discharge Instructions: Cleanse the wound with wound cleanser prior to applying a clean dressing using gauze sponges, not tissue or cotton balls. Topical: Compounding T opical Antibiotics (Home Health) 1 x Per Day/30 Days Discharge Instructions: Apply T opical Antibiotics from [facility] daily once it arrives, applies over the [place]. Prim Dressing: Promogran Prisma Matrix, 4.34 (sq in) (silver collagen) (Home Health) 1 x Per Day/30 Days ary Discharge Instructions: Moisten collagen with saline or hydrogel directly to wound bed. apply over the [place] when arrives. Secondary Dressing: Woven Gauze Sponges 2x2 in (Home Health) 1 x Per Day/30 Days Discharge Instructions: Apply over primary dressing. may tape down with medipore tape. Secured With: Insurance underwriter, Sterile 2x75 (in/in) (Home Health) 1 x Per Day/30 Days Discharge Instructions: Secure with stretch gauze or use paper tape to secure the gauze. Secured With: Paper T ape, 2x10 (in/yd) (Home Health) 1 x Per Day/30 Days Discharge Instructions: Secure dressing with tape as directed. 1. [place] and collagen to the wound beds 2. Follow-up in 1 month Electronic Signature(s) Signed: 05/20/2022 2:17:28 PM By: Megan Corwin [name] Entered By: [name] on 05/20/2022  14:16:59 -------------------------------------------------------------------------------- HxROS Details Patient Name: Date of Service: Megan Chick TTIE M. 05/20/2022 1:45 PM Medical Record Number: [id number] Patient Account Number: [id number] Date of Birth/Sex: Treating RN: 1927-09-22 (88 y.o. Megan Silence Primary Care Provider: [name] Other Clinician: Referring Provider: Treating Provider/Extender: [name] in Treatment: 16 Information Obtained From Patient Caregiver Eyes Medical History: Positive for: Cataracts Hematologic/Lymphatic Medical History: Positive for: Anemia Past Medical History Notes: Thrombocytopenia Respiratory Medical History: Negative for: Chronic Obstructive Pulmonary Disease (COPD) Cardiovascular Medical History: Positive for: Congestive Heart Failure; Hypotension; Peripheral Venous Disease Endocrine Medical History: Past Medical History Notes: Hyperthyroidism, Hypothyroidism Musculoskeletal Medical History: Positive for: 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 Neurologic Medical History: Positive for: Neuropathy HBO Extended History Items Eyes: Cataracts Immunizations Pneumococcal Vaccine: Received Pneumococcal Vaccination: No Implantable Devices None Family and Social History Cancer: Yes - Siblings; Diabetes: No; Heart Disease: Yes; Stroke: Yes - Mother; Former smoker - quit 2003; Marital Status - Widowed; Alcohol Use: Never; Drug Use: No History; Caffeine Use: Never; Financial Concerns: No; Food, Clothing or Shelter Needs: No; Support System Lacking: No; Transportation Concerns: No Electronic Signature(s) Signed: 05/20/2022 2:17:28 PM By: Megan Corwin [name] Signed: 05/20/2022 5:16:45 PM By: Shawn Stall RN, BSN Entered By: [name] on 05/20/2022 14:13:31 -------------------------------------------------------------------------------- SuperBill Details Patient Name: Date of  Service: [name]. 05/20/2022 Medical Record Number: [id number] Patient Account Number: [id number] Date of Birth/Sex: Treating RN: 1927-12-15 (88 y.o. Roel Cluck Primary Care Provider: [name] Other Clinician: Referring Provider: Treating Provider/Extender: [name] in Treatment: 16 Diagnosis Coding ICD-10 Codes Code Description [phone] Non-pressure chronic ulcer of other part of right foot with fat layer exposed L97.822 Non-pressure chronic ulcer of other part of left lower leg with fat layer exposed G90.09 Other idiopathic peripheral autonomic neuropathy M86.9 Osteomyelitis, unspecified I48.91 Unspecified atrial fibrillation Z79.01 Long term (current) use of anticoagulants I89.0 Lymphedema, not elsewhere classified I87.2 Venous insufficiency (chronic) (peripheral) J44.9 Chronic obstructive pulmonary disease, unspecified I73.9 Peripheral vascular disease, unspecified Facility Procedures CPT4 Code: [id number] Description: 99213 - WOUND CARE VISIT-LEV 3 EST PT Modifier: Quantity: 1 Physician Procedures : CPT4 Code Description Modifier [id number] - WC PHYS LEVEL 3 - EST PT ICD-10 Diagnosis Description L97.512 Non-pressure  chronic ulcer of other part of right foot with fat layer exposed M86.9 Osteomyelitis, unspecified I73.9 Peripheral vascular  disease, unspecified I87.2 Venous insufficiency (chronic) (peripheral) Quantity: 1 Electronic Signature(s) Signed: 05/20/2022 2:17:28 PM By: Megan Corwin [name] Entered By: [name] on 05/20/2022 14:17:06

## 2022-05-20 NOTE — Progress Notes (Signed)
[name] ([id number]) Visit Report for 05/20/2022 Arrival Information Details Patient Name: Date of Service: [name], [name]. 05/20/2022 1:45 PM Medical Record Number: [id number] Patient Account Number: [id number] Date of Birth/Sex: Treating RN: 1927/12/11 (88 y.o. [name] Primary Care Babette Stum: [name] Other Clinician: Referring Nakiesha Rumsey: Treating Machael Raine/Extender: [name] in Treatment: 16 Visit Information History Since Last Visit Added or deleted any medications: No Patient Arrived: Wheel Chair Any new allergies or adverse reactions: No Arrival Time: 13:45 Had a fall or experienced change in No Accompanied By: Daughter activities of daily living that may affect Transfer Assistance: Manual risk of falls: Patient Identification Verified: Yes Signs or symptoms of abuse/neglect since No Secondary Verification Process Completed: Yes last visito Patient Requires Transmission-Based Precautions: No Hospitalized since last visit: No Patient Has Alerts: Yes Implantable device outside of the clinic No Patient Alerts: Patient on Blood Thinner excluding cellular tissue based products placed in the center since last visit: Has Dressing in Place as Prescribed: Yes Has Footwear/Offloading in Place as Yes Prescribed: Right: Surgical Shoe with Pressure Relief Insole Pain Present Now: No Electronic Signature(s) Signed: 05/20/2022 4:35:55 PM By: Antonieta Iba Entered By: Antonieta Iba on 05/20/2022 13:51:47 -------------------------------------------------------------------------------- Clinic Level of Care Assessment Details Patient Name: Date of Service: [name], [name] 05/20/2022 1:45 PM Medical Record Number: [id number] Patient Account Number: [id number] Date of Birth/Sex: Treating RN: [birth year] (88 y.o. [name] Primary Care [name]: [name] Other Clinician: Referring Taedyn Glasscock: Treating Thana Ramp/Extender:  [name] in Treatment: 16 Clinic Level of Care Assessment Items TOOL 4 Quantity Score X- 1 0 Use when only an EandM is performed on FOLLOW-UP visit ASSESSMENTS - Nursing Assessment / Reassessment X- 1 10 Reassessment of Co-morbidities (includes updates in patient status) X- 1 5 Reassessment of Adherence to Treatment Plan ASSESSMENTS - Wound and Skin A ssessment / Reassessment []  - 0 Simple Wound Assessment / Reassessment - one wound X- 2 5 Complex Wound Assessment / Reassessment - multiple wounds []  - 0 Dermatologic / Skin Assessment (not related to wound area) ASSESSMENTS - Focused Assessment []  - 0 Circumferential Edema Measurements - multi extremities []  - 0 Nutritional Assessment / Counseling / Intervention []  - 0 Lower Extremity Assessment (monofilament, tuning fork, pulses) []  - 0 Peripheral Arterial Disease Assessment (using hand held doppler) ASSESSMENTS - Ostomy and/or Continence Assessment and Care []  - 0 Incontinence Assessment and Management []  - 0 Ostomy Care Assessment and Management (repouching, etc.) PROCESS - Coordination of Care []  - 0 Simple Patient / Family Education for ongoing care X- 1 20 Complex (extensive) Patient / Family Education for ongoing care []  - 0 Staff obtains , Records, T Results / Process Orders est X- 1 10 Staff telephones HHA, Nursing Homes / Clarify orders / etc []  - 0 Routine Transfer to another Facility (non-emergent condition) []  - 0 Routine Hospital Admission (non-emergent condition) []  - 0 New Admissions / / Ordering NPWT Apligraf, etc. , []  - 0 Emergency Hospital Admission (emergent condition) []  - 0 Simple Discharge Coordination []  - 0 Complex (extensive) Discharge Coordination PROCESS - Special Needs []  - 0 Pediatric / Minor Patient Management []  - 0 Isolation Patient Management []  - 0 Hearing / Language / Visual special needs []  - 0 Assessment  of Community assistance (transportation, D/C planning, etc.) []  - 0 Additional assistance / Altered mentation []  - 0 Support Surface(s) Assessment (bed, cushion, seat, etc.) INTERVENTIONS - Wound Cleansing / Measurement []  -  0 Simple Wound Cleansing - one wound X- 2 5 Complex Wound Cleansing - multiple wounds X- 1 5 Wound Imaging (photographs - any number of wounds) []  - 0 Wound Tracing (instead of photographs) []  - 0 Simple Wound Measurement - one wound X- 2 5 Complex Wound Measurement - multiple wounds INTERVENTIONS - Wound Dressings X - Small Wound Dressing one or multiple wounds 2 10 []  - 0 Medium Wound Dressing one or multiple wounds []  - 0 Large Wound Dressing one or multiple wounds []  - 0 Application of Medications - topical []  - 0 Application of Medications - injection INTERVENTIONS - Miscellaneous []  - 0 External ear exam []  - 0 Specimen Collection (cultures, biopsies, blood, body fluids, etc.) []  - 0 Specimen(s) / Culture(s) sent or taken to Lab for analysis []  - 0 Patient Transfer (multiple staff / / Similar devices) []  - 0 Simple Staple / Suture removal (25 or less) []  - 0 Complex Staple / Suture removal (26 or more) []  - 0 Hypo / Hyperglycemic Management (close monitor of Blood Glucose) []  - 0 Ankle / Brachial Index (ABI) - do not check if billed separately X- 1 5 Vital Signs Has the patient been seen at the hospital within the last three years: Yes Total Score: 105 Level Of Care: New/Established - Level 3 Electronic Signature(s) Signed: 05/20/2022 4:35:55 PM By: Entered By: on 05/20/2022 14:11:28 -------------------------------------------------------------------------------- Encounter Discharge Information Details Patient Name: Date of Service: . 05/20/2022 1:45 PM Medical Record Number: Patient Account Number: Date of Birth/Sex: Treating RN: December 21, 1927 (88 y.o. Primary Care [name]: Other Clinician: Referring Lona Six: Treating Aubreyana Saltz/Extender: in Treatment: 16 Encounter Discharge Information Items Discharge Condition: Stable Ambulatory Status: Wheelchair Discharge Destination: Home Transportation: Private Auto Accompanied By: daughter Schedule Follow-up Appointment: Yes Clinical Summary of Care: Provided on 05/20/2022 Form Type Recipient Paper Patient Patient Electronic Signature(s) Signed: 05/20/2022 4:35:55 PM By: Antonieta Iba Entered By: Antonieta Iba on 05/20/2022 14:20:48 -------------------------------------------------------------------------------- Lower Extremity Assessment Details Patient Name: Date of Service: [name] 05/20/2022 1:45 PM Medical Record Number: [id number] Patient Account Number: [id number] Date of Birth/Sex: Treating RN: 1928/04/26 (88 y.o. [name] Primary Care Klee Kolek: [name] Other Clinician: Referring Kasheem Toner: Treating Sullivan Blasing/Extender: [name] Weeks in Treatment: 16 Edema Assessment Assessed: 07/20/2022: No] 07/20/2022: Yes] Edema: [Left: Ye] [Right: s] Calf Left: Right: Point of Measurement: 30 cm From Medial Instep 42 cm Ankle Left: Right: Point of Measurement: 9 cm From Medial Instep 23.2 cm Vascular Assessment Pulses: Dorsalis Pedis Palpable: [Right:Yes] Electronic Signature(s) Signed: 05/20/2022 4:35:55 PM By: Antonieta Iba Entered By: 07/20/2022 on 05/20/2022 13:58:50 -------------------------------------------------------------------------------- Multi Wound Chart Details Patient Name: Date of Service: 07/20/2022. 05/20/2022 1:45 PM Medical Record Number: [id number] Patient Account Number: 05/11/1928 Date of Birth/Sex: Treating RN: 20-Oct-1927 (88 y.o. [name], [name] Primary Care Kacin Dancy: Kyra Searles Other Clinician: Referring Cyndee Giammarco: Treating  Ethelyne Erich/Extender: Franne Forts in Treatment: 16 Vital Signs Height(in): 62 Pulse(bpm): 64 Weight(lbs): 181 Blood Pressure(mmHg): 129/68 Body Mass Index(BMI): 33.1 Temperature(F): 98.2 Respiratory Rate(breaths/min): 18 Photos: [N/A:N/A] Right Amputation Site - Toe Right T Great oe N/A Wound Location: Surgical Injury Shear/Friction N/A Wounding Event: Open Surgical Wound Abrasion N/A Primary Etiology: Cataracts, Anemia, Congestive Heart Cataracts, Anemia, Congestive Heart N/A Comorbid History: Failure, Hypotension, Peripheral Failure, Hypotension, Peripheral Venous Disease, Osteomyelitis, Venous Disease, Osteomyelitis, Neuropathy Neuropathy 11/17/2021 02/06/2022 N/A Date  Acquired: 16 14 N/A Weeks of Treatment: Open Open N/A Wound Status: No No N/A Wound Recurrence: 0.4x0.4x0.3 0.3x0.4x0.2 N/A Measurements L x W x D (cm) 0.126 0.094 N/A A (cm) : rea 0.038 0.019 N/A Volume (cm) : 70.80% 75.60% N/A % Reduction in Area: 82.40% 50.00% N/A % Reduction in Volume: Full Thickness Without Exposed Full Thickness Without Exposed N/A Classification: Support Structures Support Structures Medium Medium N/A Exudate Amount: Serosanguineous Serosanguineous N/A Exudate Type: red, brown red, brown N/A Exudate Color: Epibole Distinct, outline attached N/A Wound Margin: Medium (34-66%) Large (67-100%) N/A Granulation Amount: Pink, Pale Pink N/A Granulation Quality: Medium (34-66%) None Present (0%) N/A Necrotic Amount: Fat Layer (Subcutaneous Tissue): Yes Fat Layer (Subcutaneous Tissue): Yes N/A Exposed Structures: Fascia: No Fascia: No Tendon: No Tendon: No Muscle: No Muscle: No Joint: No Joint: No Bone: No Bone: No Small (1-33%) Large (67-100%) N/A Epithelialization: Treatment Notes Electronic Signature(s) Signed: 05/20/2022 2:17:28 PM By: Geralyn CorwinHoffman, Jessica DO Signed: 05/20/2022 5:16:45 PM By: Shawn Stalleaton, Bobbi RN, BSN Entered By: Geralyn CorwinHoffman,  Jessica on 05/20/2022 14:12:48 -------------------------------------------------------------------------------- Multi-Disciplinary Care Plan Details Patient Name: Date of Service: [name], [name] 05/20/2022 1:45 PM Medical Record Number: [id number] Patient Account Number: [id number] Date of Birth/Sex: Treating RN: 03/08/1928 (88 y.o. Megan CluckF) Barnhart, Jodi Primary Care Jden Want: Mila PalmerWolters, Sharon Other Clinician: Referring Renuka Farfan: Treating Aurther Harlin/Extender: Burman RiisHoffman, Jessica Wolters, Sharon Weeks in Treatment: 16 Active Inactive Nutrition Nursing Diagnoses: Potential for alteratiion in Nutrition/Potential for imbalanced nutrition Goals: Patient/caregiver agrees to and verbalizes understanding of need to use nutritional supplements and/or vitamins as prescribed Date Initiated: 01/28/2022 Target Resolution Date: 06/06/2022 Goal Status: Active Interventions: Assess patient nutrition upon admission and as needed per policy Provide education on nutrition Treatment Activities: Education provided on Nutrition : 04/22/2022 Patient referred to Primary Care Physician for further nutritional evaluation : 01/28/2022 Notes: Pain, Acute or Chronic Nursing Diagnoses: Pain, acute or chronic: actual or potential Potential alteration in comfort, pain Goals: Patient will verbalize adequate pain control and receive pain control interventions during procedures as needed Date Initiated: 01/28/2022 Target Resolution Date: 06/06/2022 Goal Status: Active Interventions: Encourage patient to take pain medications as prescribed Provide education on pain management Treatment Activities: Administer pain control measures as ordered : 01/28/2022 Notes: Wound/Skin Impairment Nursing Diagnoses: Knowledge deficit related to ulceration/compromised skin integrity Goals: Patient/caregiver will verbalize understanding of skin care regimen Date Initiated: 01/28/2022 Target Resolution Date: 06/06/2022 Goal Status:  Active Interventions: Assess patient/caregiver ability to perform ulcer/skin care regimen upon admission and as needed Assess ulceration(s) every visit Provide education on ulcer and skin care Treatment Activities: Skin care regimen initiated : 01/28/2022 Topical wound management initiated : 01/28/2022 Notes: Electronic Signature(s) Signed: 05/20/2022 4:35:55 PM By: Antonieta IbaBarnhart, Jodi Entered By: Antonieta IbaBarnhart, Jodi on 05/20/2022 13:44:50 -------------------------------------------------------------------------------- Pain Assessment Details Patient Name: Date of Service: [name], [name] 05/20/2022 1:45 PM Medical Record Number: [id number] Patient Account Number: [id number] Date of Birth/Sex: Treating RN: 07/16/1928 (88 y.o. Megan CluckF) Barnhart, Jodi Primary Care Kina Shiffman: Mila PalmerWolters, Sharon Other Clinician: Referring Hershell Brandl: Treating Dallan Schonberg/Extender: Burman RiisHoffman, Jessica Wolters, Sharon Weeks in Treatment: 16 Active Problems Location of Pain Severity and Description of Pain Patient Has Paino No Site Locations Pain Management and Medication Current Pain Management: Electronic Signature(s) Signed: 05/20/2022 4:35:55 PM By: Antonieta IbaBarnhart, Jodi Entered By: Antonieta IbaBarnhart, Jodi on 05/20/2022 13:53:43 -------------------------------------------------------------------------------- Patient/Caregiver Education Details Patient Name: Date of Service: [name], [name] 9/12/2023andnbsp1:45 PM Medical Record Number: [id number] Patient Account Number: [id number] Date of Birth/Gender: Treating RN: 07/27/1928 (88 y.o. Megan CluckF) Barnhart, Jodi Primary Care Physician: Mila PalmerWolters, Sharon  Other Clinician: Referring Physician: Treating Physician/Extender: [name] in Treatment: 16 Education Assessment Education Provided To: Patient Education Topics Provided Nutrition: Methods: Explain/Verbal, Printed Responses: State content correctly Wound/Skin Impairment: Methods: Demonstration, Explain/Verbal,  Printed Responses: State content correctly Electronic Signature(s) Signed: 05/20/2022 4:35:55 PM By: Antonieta Iba Entered By: Antonieta Iba on 05/20/2022 13:45:20 -------------------------------------------------------------------------------- Wound Assessment Details Patient Name: Date of Service: [name] 05/20/2022 1:45 PM Medical Record Number: [id number] Patient Account Number: [id number] Date of Birth/Sex: Treating RN: Sep 19, 1927 (88 y.o. [name] Primary Care Qualyn Oyervides: [name] Other Clinician: Referring Jarek Longton: Treating Toiya Morrish/Extender: [name] in Treatment: 16 Wound Status Wound Number: 3 Primary Open Surgical Wound Etiology: Wound Location: Right Amputation Site - Toe Wound Open Wounding Event: Surgical Injury Status: Date Acquired: 11/17/2021 Comorbid Cataracts, Anemia, Congestive Heart Failure, Hypotension, Weeks Of Treatment: 16 History: Peripheral Venous Disease, Osteomyelitis, Neuropathy Clustered Wound: No Photos Wound Measurements Length: (cm) 0.4 Width: (cm) 0.4 Depth: (cm) 0.3 Area: (cm) 0.126 Volume: (cm) 0.038 % Reduction in Area: 70.8% % Reduction in Volume: 82.4% Epithelialization: Small (1-33%) Tunneling: No Undermining: No Wound Description Classification: Full Thickness Without Exposed Support Structures Wound Margin: Epibole Exudate Amount: Medium Exudate Type: Serosanguineous Exudate Color: red, brown Foul Odor After Cleansing: No Slough/Fibrino Yes Wound Bed Granulation Amount: Medium (34-66%) Exposed Structure Granulation Quality: Pink, Pale Fascia Exposed: No Necrotic Amount: Medium (34-66%) Fat Layer (Subcutaneous Tissue) Exposed: Yes Necrotic Quality: Adherent Slough Tendon Exposed: No Muscle Exposed: No Joint Exposed: No Bone Exposed: No Treatment Notes Wound #3 (Amputation Site - Toe) Wound Laterality: Right Cleanser Soap and Water Discharge Instruction: May  shower and wash wound with dial antibacterial soap and water prior to dressing change. Wound Cleanser Discharge Instruction: Cleanse the wound with wound cleanser prior to applying a clean dressing using gauze sponges, not tissue or cotton balls. Peri-Wound Care Topical Compounding T opical Antibiotics Discharge Instruction: Apply T opical Antibiotics from [facility] daily once it arrives, applies over the [place]. Primary Dressing Promogran Prisma Matrix, 4.34 (sq in) (silver collagen) Discharge Instruction: Moisten collagen with saline or hydrogel directly to wound bed. apply over the [place] when arrives. Secondary Dressing Woven Gauze Sponges 2x2 in Discharge Instruction: Apply over primary dressing. ***Apply a rolled gauze under the right great toe. Secured With Conforming Stretch Gauze Bandage, Sterile 2x75 (in/in) Discharge Instruction: Secure with stretch gauze or use paper tape to secure the gauze. Paper Tape, 2x10 (in/yd) Discharge Instruction: Secure dressing with tape as directed. Compression Wrap Compression Stockings Add-Ons Electronic Signature(s) Signed: 05/20/2022 4:35:55 PM By: Antonieta Iba Entered By: Antonieta Iba on 05/20/2022 14:01:03 -------------------------------------------------------------------------------- Wound Assessment Details Patient Name: Date of Service: [name] 05/20/2022 1:45 PM Medical Record Number: [id number] Patient Account Number: [id number] Date of Birth/Sex: Treating RN: February 25, 1928 (88 y.o. [name] Primary Care Ebone Alcivar: [name] Other Clinician: Referring Guillaume Weninger: Treating Maylen Waltermire/Extender: [name] in Treatment: 16 Wound Status Wound Number: 5 Primary Abrasion Etiology: Wound Location: Right T Great oe Wound Open Wounding Event: Shear/Friction Status: Date Acquired: 02/06/2022 Comorbid Cataracts, Anemia, Congestive Heart Failure, Hypotension, Weeks Of  Treatment: 14 History: Peripheral Venous Disease, Osteomyelitis, Neuropathy Clustered Wound: No Photos Wound Measurements Length: (cm) 0.3 Width: (cm) 0.4 Depth: (cm) 0.2 Area: (cm) 0.094 Volume: (cm) 0.019 % Reduction in Area: 75.6% % Reduction in Volume: 50% Epithelialization: Large (67-100%) Tunneling: No Undermining: No Wound Description Classification: Full Thickness Without Exposed Support Structures Wound Margin: Distinct, outline attached Exudate Amount:  Medium Exudate Type: Serosanguineous Exudate Color: red, brown Foul Odor After Cleansing: No Slough/Fibrino No Wound Bed Granulation Amount: Large (67-100%) Exposed Structure Granulation Quality: Pink Fascia Exposed: No Necrotic Amount: None Present (0%) Fat Layer (Subcutaneous Tissue) Exposed: Yes Tendon Exposed: No Muscle Exposed: No Joint Exposed: No Bone Exposed: No Treatment Notes Wound #5 (Toe Great) Wound Laterality: Right Cleanser Soap and Water Discharge Instruction: May shower and wash wound with dial antibacterial soap and water prior to dressing change. Wound Cleanser Discharge Instruction: Cleanse the wound with wound cleanser prior to applying a clean dressing using gauze sponges, not tissue or cotton balls. Peri-Wound Care Topical Compounding T opical Antibiotics Discharge Instruction: Apply T opical Antibiotics from [name] daily once it arrives, applies over the [place]. Primary Dressing Promogran Prisma Matrix, 4.34 (sq in) (silver collagen) Discharge Instruction: Moisten collagen with saline or hydrogel directly to wound bed. apply over the [place] when arrives. Secondary Dressing Woven Gauze Sponges 2x2 in Discharge Instruction: Apply over primary dressing. may tape down with medipore tape. Secured With Conforming Stretch Gauze Bandage, Sterile 2x75 (in/in) Discharge Instruction: Secure with stretch gauze or use paper tape to secure the gauze. Paper Tape, 2x10  (in/yd) Discharge Instruction: Secure dressing with tape as directed. Compression Wrap Compression Stockings Add-Ons Electronic Signature(s) Signed: 05/20/2022 4:35:55 PM By: Antonieta Iba Entered By: Antonieta Iba on 05/20/2022 14:08:21 -------------------------------------------------------------------------------- Vitals Details Patient Name: Date of Service: [name] 05/20/2022 1:45 PM Medical Record Number: [id number] Patient Account Number: [id number] Date of Birth/Sex: Treating RN: 1928/03/24 (88 y.o. [name] Primary Care Taequan Stockhausen: [name] Other Clinician: Referring Anahlia Iseminger: Treating Trashawn Oquendo/Extender: [name] in Treatment: 16 Vital Signs Time Taken: 13:53 Temperature (F): 98.2 Height (in): 62 Pulse (bpm): 64 Weight (lbs): 181 Respiratory Rate (breaths/min): 18 Body Mass Index (BMI): 33.1 Blood Pressure (mmHg): 129/68 Reference Range: 80 - 120 mg / dl Electronic Signature(s) Signed: 05/20/2022 4:35:55 PM By: Antonieta Iba Entered By: Antonieta Iba on 05/20/2022 13:53:32

## 2022-05-21 ENCOUNTER — Encounter (HOSPITAL_BASED_OUTPATIENT_CLINIC_OR_DEPARTMENT_OTHER): Payer: Self-pay | Admitting: Cardiology

## 2022-05-21 ENCOUNTER — Ambulatory Visit (INDEPENDENT_AMBULATORY_CARE_PROVIDER_SITE_OTHER): Payer: Medicare HMO | Admitting: Cardiology

## 2022-05-21 VITALS — BP 108/57 | HR 53 | Ht 62.0 in | Wt 182.2 lb

## 2022-05-21 DIAGNOSIS — I739 Peripheral vascular disease, unspecified: Secondary | ICD-10-CM

## 2022-05-21 DIAGNOSIS — D6869 Other thrombophilia: Secondary | ICD-10-CM

## 2022-05-21 DIAGNOSIS — I4821 Permanent atrial fibrillation: Secondary | ICD-10-CM | POA: Diagnosis not present

## 2022-05-21 DIAGNOSIS — I272 Pulmonary hypertension, unspecified: Secondary | ICD-10-CM | POA: Diagnosis not present

## 2022-05-21 NOTE — Progress Notes (Signed)
Cardiology Office Note:    Date:  05/21/2022   ID:  TALULA [name], DOB 09/21/1927, MRN [id number]  PCP:  [name], MD  Cardiologist:  Buford Dresser, MD PhD  Referring MD: [name], MD   CC: follow up  History of Present Illness:    [name] is a 88 y.o. female with a hx of atrial fibrillation on coumadin, essential hypertension, pulmonary hypertension, and chronic diastolic heart failure, here for follow up. Her initial consult (telemedicine) was 11/25/18.   Cardiac history: Previously followed by Dr. Tish Men at [facility] (notes under media tab). Per notes:  -echo 2014, lexiscan 2015 low risk,  -echo 2019 hyperdynamic EF, septal flattening, severe RV dilation, RVSP noted as critical (near systemic) pulmonary hypertension, severe biatrial enlargement, dilated coronary sinus ?persistent left SVC.  -Venous study 2019 with bilateral mild insufficiency -Echo 11/10/17 with hyperdynamic EF, septal flattening, severe RV dilation, severe TR, severe pulmonary hypertension (listed at 71 mmHg in note)  Today:  She is accompanied by her daughter, who provided the history. She says she has been okay.   Her at home systolic blood pressures are around 108, 110, 112, 120's. If she is more worried about something it will read around 140. She is not sure the machine was accurate, as the nurse would come to check her BP and it would read around 120. She has since ordered a new machine.   She has been experiencing some stress over a family member's recent parkinson's diagnosis.  She denies any palpitations, chest pain, shortness of breath, or peripheral edema. No lightheadedness, headaches, syncope, orthopnea, or PND.   Past Medical History:  Diagnosis Date   Anticoagulant long-term use    Arrhythmia    Arthritis    Atrial fibrillation (HCC)    Congestive heart failure (CHF) (HCC)    Dysphagia    Esophageal reflux    Essential hypertension    Hiatal hernia     Pulmonary embolus ([place])    Thyroid disease     Past Surgical History:  Procedure Laterality Date   AMPUTATION TOE Right 11/17/2021   Procedure: RIGHT SECOND TOE AMPUTATION;  Surgeon: Wylene Simmer, MD;  Location: [facility];  Service: Orthopedics;  Laterality: Right;   COLONOSCOPY     ELBOW SURGERY     LAPAROSCOPIC HYSTERECTOMY      Current Medications: Current Outpatient Medications on File Prior to Visit  Medication Sig   acetaminophen (TYLENOL) 325 MG tablet Take 650 mg by mouth every 6 (six) hours as needed for moderate pain.   Apoaequorin (PREVAGEN PO) Take 1 capsule by mouth at bedtime. (Patient not taking: Reported on 04/11/2022)   bumetanide (BUMEX) 1 MG tablet Take 1 tablet (1 mg total) by mouth daily. May take an additional tablet as needed for weight gain of 3 pounds overnight or 5 pounds in one week.   carboxymethylcellulose (REFRESH PLUS) 0.5 % SOLN Place 1 drop into both eyes 2 (two) times daily.   cholecalciferol (VITAMIN D3) 25 MCG (1000 UT) tablet Take 1,000 Units by mouth daily.   clotrimazole-betamethasone (LOTRISONE) cream Apply 1 application. topically 2 (two) times daily as needed (rash/irritation).   diclofenac Sodium (VOLTAREN) 1 % GEL Apply 2 g topically 4 (four) times daily as needed (pain).   gabapentin (NEURONTIN) 300 MG capsule Take 300 mg by mouth 2 (two) times daily.    levothyroxine (SYNTHROID) 100 MCG tablet Take 100 mcg by mouth every morning.   losartan (COZAAR) 100 MG tablet  Take 1 tablet (100 mg total) by mouth daily.   Menthol, Topical Analgesic, (BIOFREEZE) 10 % CREA Apply 1 application. topically daily as needed (pain).   metoprolol succinate (TOPROL-XL) 50 MG 24 hr tablet TAKE 1 TABLET BY MOUTH EVERY DAY WITH OR IMMEDIATELY FOLLOWING A MEAL (Patient taking differently: Take 50 mg by mouth daily.)   Multiple Vitamins-Minerals (PRESERVISION/LUTEIN) CAPS Take 1 capsule by mouth 2 (two) times daily.    pantoprazole (PROTONIX) 40 MG tablet Take 40 mg by mouth  daily.   senna-docusate (SENOKOT-S) 8.6-50 MG tablet Take 1 tablet by mouth 2 (two) times daily between meals as needed for moderate constipation or mild constipation.   spironolactone (ALDACTONE) 25 MG tablet Take 1 tablet (25 mg total) by mouth daily with breakfast.   vitamin B-12 (CYANOCOBALAMIN) 1000 MCG tablet Take 1,000 mcg by mouth daily.   warfarin (COUMADIN) 1 MG tablet Take 1-2 mg by mouth See admin instructions. Take 18m by mouth on Tues, Thur, Saturday & Sunday, Then take  2 mg on Monday, Wednesday, Friday.   No current facility-administered medications on file prior to visit.     Allergies:   Amoxicillin, Azithromycin, Codeine, Erythromycin, Erythromycin ethylsuccinate, Green dyes, Iodine, Misc. sulfonamide containing compounds, Oxycodone, Oxycodone-acetaminophen, Penicillins, Sulfasalazine, and Tramadol   Social History   Tobacco Use   Smoking status: Former    Packs/day: 1.00    Types: Cigarettes    Quit date: 10/02/2001    Years since quitting: 20.6   Smokeless tobacco: Never   Tobacco comments:    former smoker  Vaping Use   Vaping Use: Never used     Family History: The patient's family history includes Cancer in her brother; Stroke in her mother.  ROS:   Please see the history of present illness.   (+) Stress  Additional pertinent ROS otherwise unremarkable.  EKGs/Labs/Other Studies Reviewed:    The following studies were reviewed today:  Bilateral LE Arterial Doppler 01/30/2022: Summary:  Right: Limited visualization due to involuntary movement and swelling.  Elevated velocities in the proximal/mid superficial femoral artery  suggesting 30-49% stenosis. Probable tibial occlusive disease - Posterior  tibial artery appears occluded with  reconstitution of flow in the distal calf. Peroneal artery unable to be  visualized.   Left: Limited visualization due to involuntary movement and swelling.  Elevated velocities in the proximal/mid superficial femoral  artery  suggesting 50-74% stenosis. Probable tibial occlusive disease - Posterior  tibial artery appears occluded. Peroneal  artery unable to be visualized.   ABI Doppler 01/30/2022: Summary:  Right: Resting right ankle-brachial index indicates moderate right lower  extremity arterial disease.   Left: Resting left ankle-brachial index indicates mild left lower  extremity arterial disease. The left toe-brachial index is abnormal.    EKG:  EKG is personally reviewed.   05/21/22: not ordered today 02/13/2022:  atrial fibrillation at 44 bpm, iRBBB 02/05/2021: Atrial fibrillation, Rate 54 bpm, iRBBB 01/05/20: Afib at 45 bpm  Recent Labs: 11/16/2021: ALT 18 11/21/2021: Magnesium 1.7; Platelets 195 02/21/2022: BUN 47; Creatinine, Ser 2.08; Hemoglobin 12.2; Potassium 4.6; Sodium 136   Recent Lipid Panel No results found for: "CHOL", "TRIG", "HDL", "CHOLHDL", "VLDL", "LDLCALC", "LDLDIRECT"  Physical Exam:    VS:  BP (!) 108/57 (BP Location: Left Arm, Patient Position: Sitting, Cuff Size: Large)   Pulse (!) 53   Ht 5' 2" (1.575 m)   Wt 182 lb 3.2 oz (82.6 kg)   SpO2 97%   BMI 33.32 kg/m     Wt  Readings from Last 3 Encounters:  05/21/22 182 lb 3.2 oz (82.6 kg)  02/21/22 181 lb (82.1 kg)  02/13/22 181 lb (82.1 kg)   GEN: Well nourished, well developed in no acute distress, in wheelchair HEENT: Normal, moist mucous membranes NECK: No JVD CARDIAC: slow, irregularly irregular rhythm, normal S1 and S2, no rubs or gallops. 2/6 systolic murmur. VASCULAR: Radial and DP pulses 2+ bilaterally. No carotid bruits RESPIRATORY:  Clear to auscultation without rales, wheezing or rhonchi  ABDOMEN: Soft, non-tender, non-distended MUSCULOSKELETAL: moves all 4 limbs independently SKIN: Warm and dry, trace bilateral LE edema, Erythematous/purplish skin discoloration of bilateral LE (R>L) NEUROLOGIC:  Alert and oriented x 3. No focal neuro deficits noted. PSYCHIATRIC:  Normal affect   ASSESSMENT:     No diagnosis found.   PLAN:    Peripheral edema 2/2 severe pulmonary hypertension and chronic diastolic heart failure -has stabilized significantly. Daughter very involved in her care. Weights and symptoms stable. -Continue bumex and spironolactone as noted -continue daily weights, leg elevation, compression stockings, salt avoidance -we have extensively discussed her pulmonary hypertension, see prior notes -overall leg swelling significantly improved since when I first met her. They feel there is a good balance of avoiding swelling and allowing her to have good quality of life (diet, frequency of urination, etc).  Hypertension: l -continue diuretics as above -metoprolol succinate, losartan range given below  If Blood pressure consistently >150/90 -->call me If blood pressure 120-159/70-90  --> no change to losartan If blood pressure 110-120 on the top -->cut losartan back to 50 mg If blood pressure consistently <110 -->stop losartan and call me  If heart rate ~60 bpm on average, no change to metoprolol If heart rate 50s --> cut to 1/2 pill (25 mg) twice a day If heart rate consistently less than 50 --> stop metoprolol and call me   Atrial fibrillation, permanent Slow ventricular response -CHA2DS2/VAS=at least 5 -on coumadin, managed per PCP.  -see above re: metoprolol  Lower extremity wound Concern for significant PAD -following with Dr. Scot Dock, managing conservatively  Plan for follow up: 3 months.   Buford Dresser, MD, PhD, [facility]    Medication Adjustments/Labs and Tests Ordered: Current medicines are reviewed at length with the patient today.  Concerns regarding medicines are outlined above.   No orders of the defined types were placed in this encounter.  No orders of the defined types were placed in this encounter.  Patient Instructions  Medication Instructions:  Your Physician recommend you continue on your current  medication as directed.    *If you need a refill on your cardiac medications before your next appointment, please call your pharmacy*   Lab Work: None ordered today   Testing/Procedures: None ordered today   Follow-Up: At [facility], you and your health needs are our priority.  As part of our continuing mission to provide you with exceptional heart care, we have created designated Provider Care Teams.  These Care Teams include your primary Cardiologist (physician) and Advanced Practice Providers (APPs -  Physician Assistants and Nurse Practitioners) who all work together to provide you with the care you need, when you need it.  We recommend signing up for the patient portal called "MyChart".  Sign up information is provided on this After Visit Summary.  MyChart is used to connect with patients for Virtual Visits (Telemedicine).  Patients are able to view lab/test results, encounter notes, upcoming appointments, etc.  Non-urgent messages can be sent to  your provider as well.   To learn more about what you can do with MyChart, go to [email or web address].    Your next appointment:   3 month(s)  The format for your next appointment:   In Person  Provider:   Buford Dresser, MD              I,Breanna Adamick,acting as a scribe for Buford Dresser, MD.,have documented all relevant documentation on the behalf of Buford Dresser, MD,as directed by  Buford Dresser, MD while in the presence of Buford Dresser, MD.   I, Buford Dresser, MD, have reviewed all documentation for this visit. The documentation on 05/21/22 for the exam, diagnosis, procedures, and orders are all accurate and complete.  Signed, Buford Dresser, MD PhD 05/21/2022  [place]

## 2022-05-21 NOTE — Patient Instructions (Signed)
Medication Instructions:  Your Physician recommend you continue on your current medication as directed.    *If you need a refill on your cardiac medications before your next appointment, please call your pharmacy*   Lab Work: None ordered today   Testing/Procedures: None ordered today   Follow-Up: At [facility], you and your health needs are our priority.  As part of our continuing mission to provide you with exceptional heart care, we have created designated Provider Care Teams.  These Care Teams include your primary Cardiologist (physician) and Advanced Practice Providers (APPs -  Physician Assistants and Nurse Practitioners) who all work together to provide you with the care you need, when you need it.  We recommend signing up for the patient portal called "MyChart".  Sign up information is provided on this After Visit Summary.  MyChart is used to connect with patients for Virtual Visits (Telemedicine).  Patients are able to view lab/test results, encounter notes, upcoming appointments, etc.  Non-urgent messages can be sent to your provider as well.   To learn more about what you can do with MyChart, go to [email or web address].    Your next appointment:   3 month(s)  The format for your next appointment:   In Person  Provider:   Bridgette Christopher, MD          

## 2022-05-30 IMAGING — MR MR FOOT*R* W/O CM
6 series · 40 of 40 positions shown · non-contrast
Comparison: Radiographs 11/15/2021

CLINICAL DATA: Foot swelling

EXAM:
MRI OF THE RIGHT FOREFOOT WITHOUT CONTRAST
TECHNIQUE: Multiplanar, multisequence MR imaging of the right forefoot was
performed. No intravenous contrast was administered.

[Series 4: T1 · coronal · right · 3.0mm · 0.47mm/px · 9 of 32 slices shown (1 of 2)]
[im 1/32]
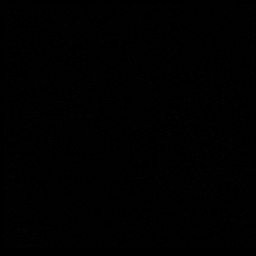
[im 4/32]
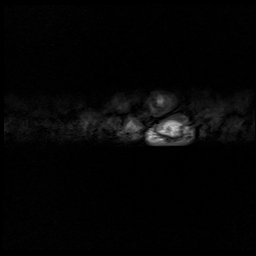
[im 8/32]
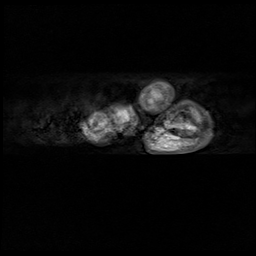
[im 12/32]
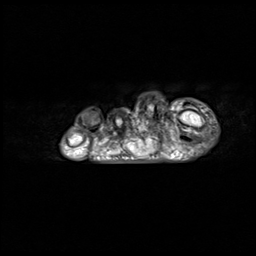
[im 16/32]
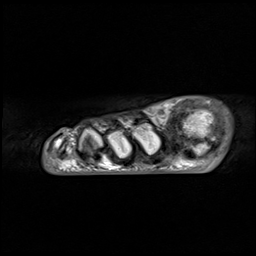
[im 20/32]
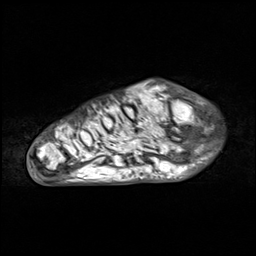
[im 24/32]
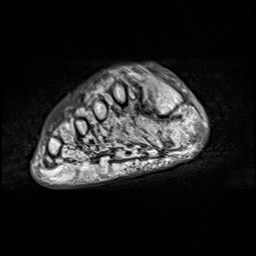
[im 28/32]
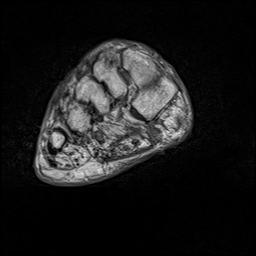
[im 32/32]
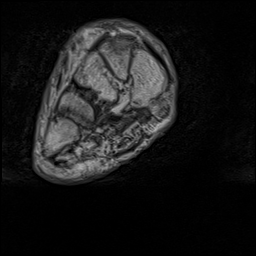

[Series 5: T2 fat-sat · coronal · right · 3.0mm · 0.38mm/px · 8 of 35 slices shown (1 of 3)]
[im 1/35]
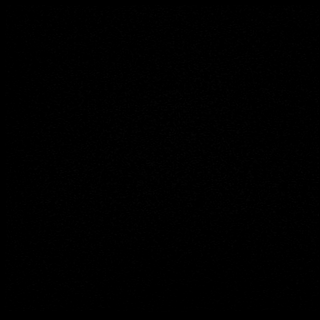
[im 5/35]
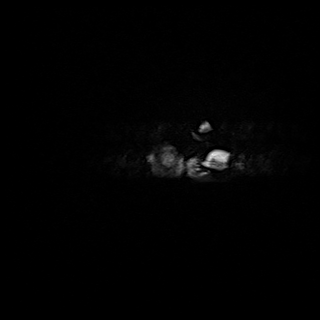
[im 10/35]
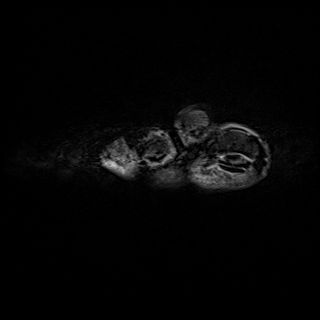
[im 15/35]
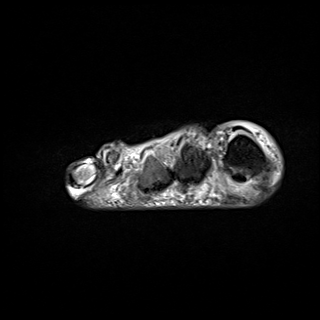
[im 20/35]
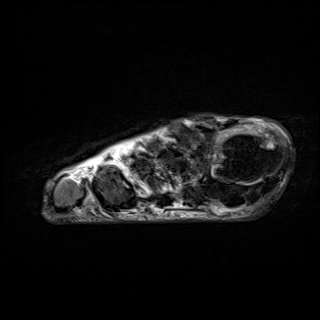
[im 25/35]
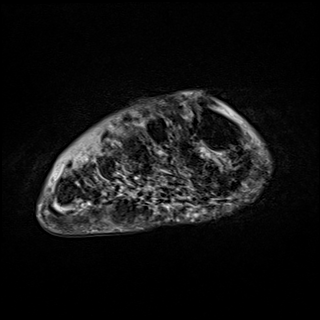
[im 30/35]
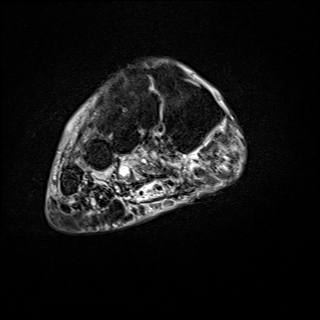
[im 35/35]
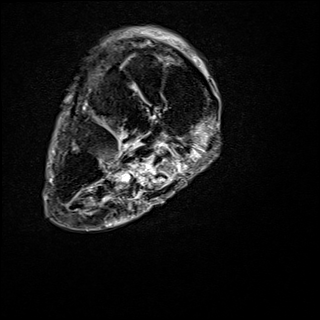

[Series 6: T2 fat-sat · axial · right · 3.0mm · 0.61mm/px · z∈[-83,-16]mm · 5 of 22 slices shown (2 of 3)]
[im 1/22]
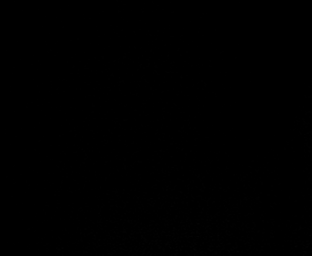
[im 6/22]
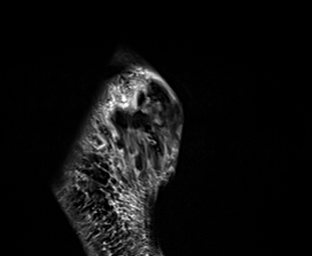
[im 11/22]
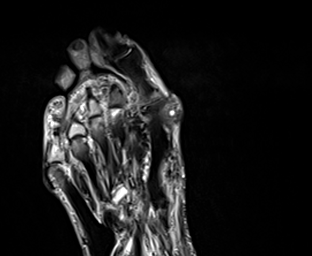
[im 16/22]
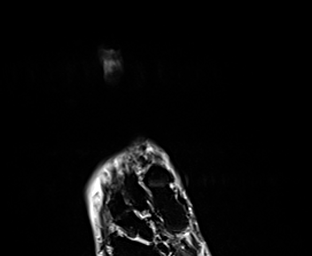
[im 22/22]
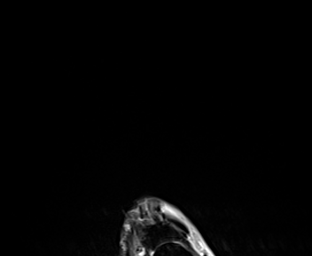

[Series 7: T1 · axial · right · 3.0mm · 0.60mm/px · z∈[-83,-23]mm · 4 of 18 slices shown (2 of 2)]
[im 1/18]
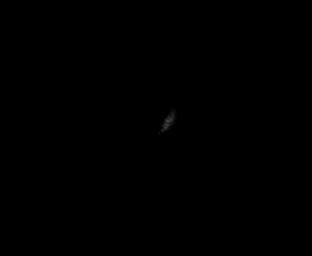
[im 6/18]
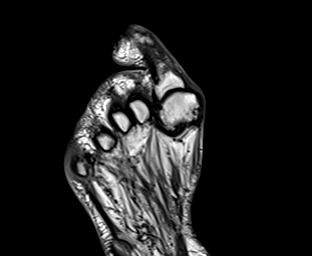
[im 12/18]
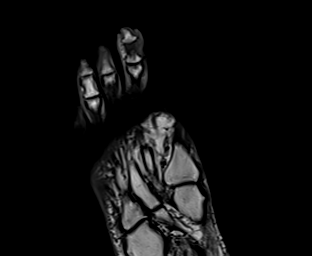
[im 18/18]
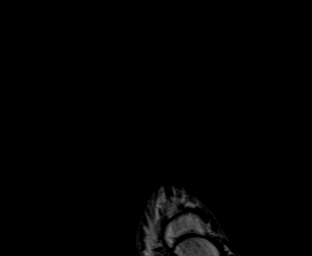

[Series 8: STIR · sagittal · right · 3.0mm · 0.35mm/px · 6 of 27 slices shown]
[im 1/27]
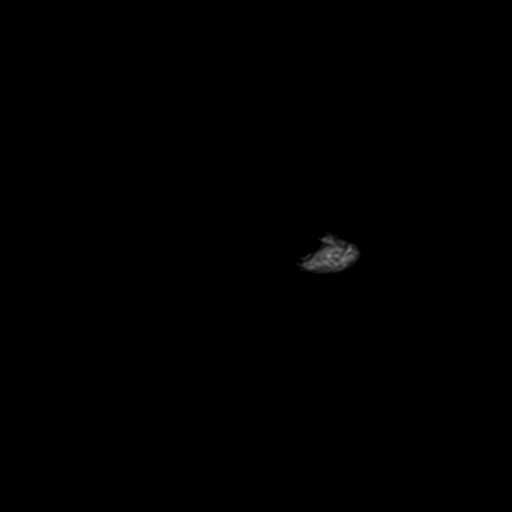
[im 6/27]
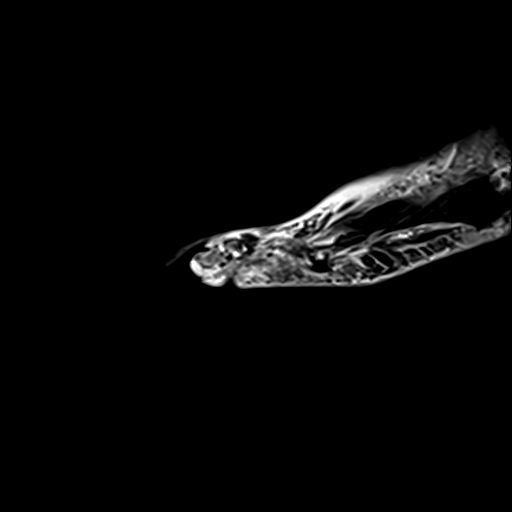
[im 11/27]
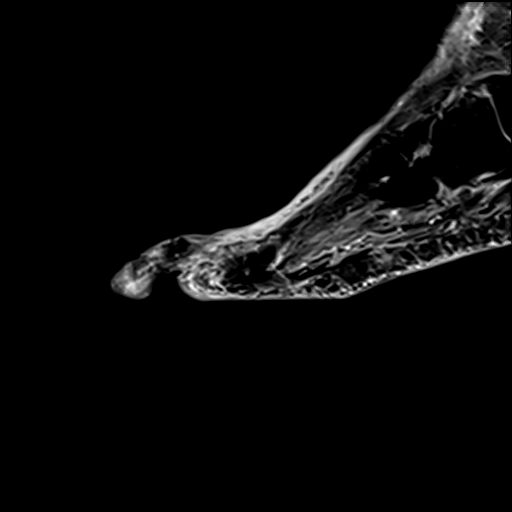
[im 16/27]
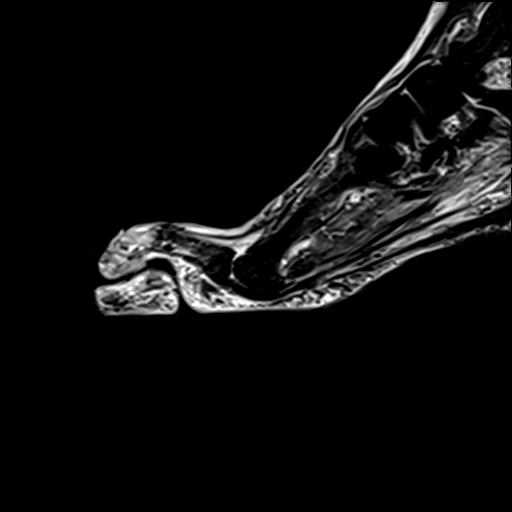
[im 21/27]
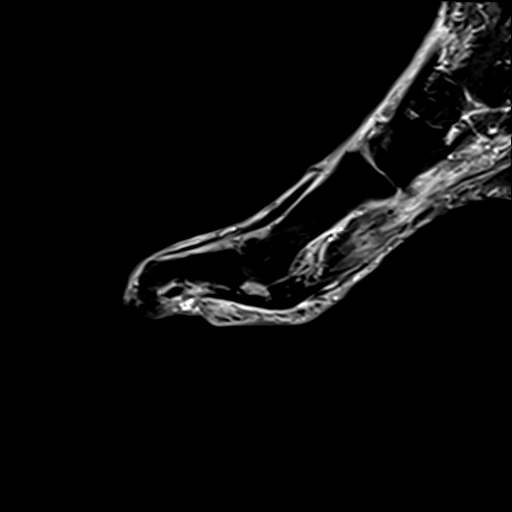
[im 27/27]
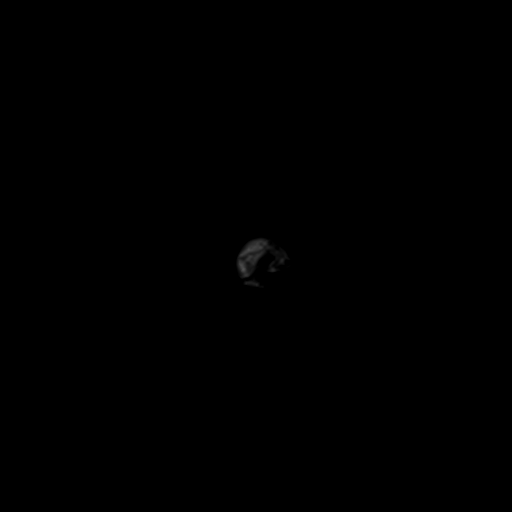

[Series 9: T2 fat-sat · coronal · right · 3.0mm · 0.38mm/px · 8 of 35 slices shown (3 of 3)]
[im 1/35]
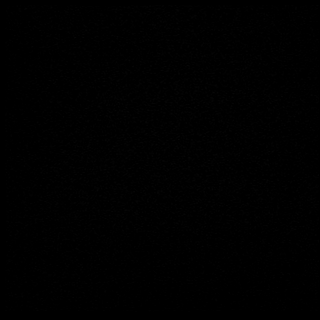
[im 5/35]
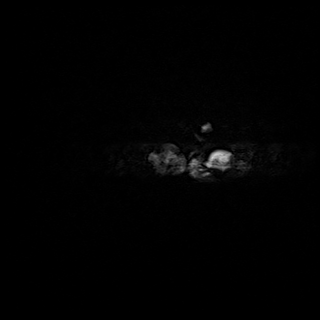
[im 10/35]
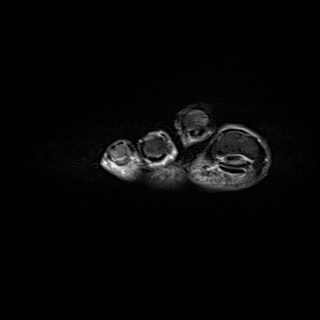
[im 15/35]
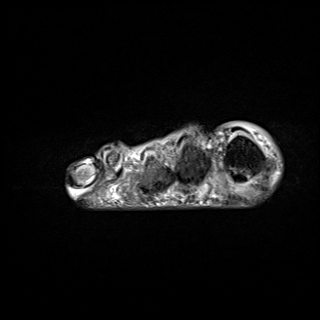
[im 20/35]
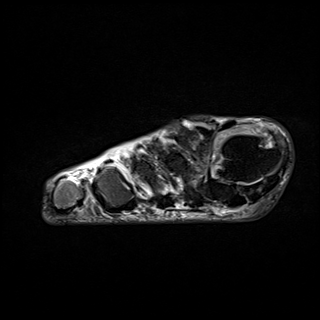
[im 25/35]
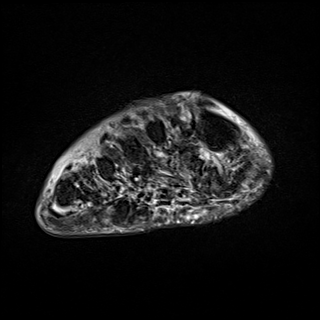
[im 30/35]
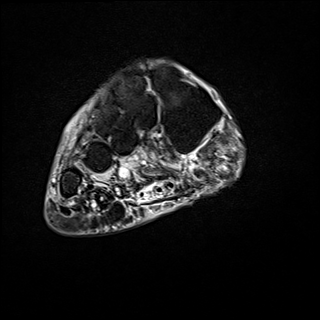
[im 35/35]
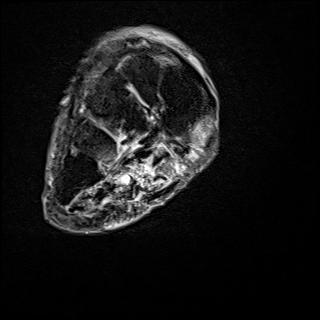

[40 of 40 positions shown; findings below may reference images not displayed]

FINDINGS: Despite efforts by the technologist and patient, motion artifact is
present on today's exam and could not be eliminated. This reduces
exam sensitivity and specificity.

Bones/Joint/Cartilage

On STIR images there is abnormal diffuse marrow edema in the middle
and distal phalanges of the second toe as on image 17 series 6,
suspicious for osteomyelitis. No substantial degree of proximal
phalangeal edema is observed.

There is much more ambiguous accentuated STIR signal in the middle
and distal phalanges of the fourth and fifth toes as well as in the
distal phalanx of the third toe. These findings in the third through
fifth toes may well be due to field heterogeneity.

Spurring along the head of the first metatarsal and along the
adjacent sesamoids. Small erosion or degenerative subcortical cyst
medially along the first metatarsal head.

Ligaments

Lisfranc ligament appears intact.

Muscles and Tendons

Regional muscular atrophy.

Soft tissues

Dorsal subcutaneous edema in the forefoot. This tracks into the
toes.
IMPRESSION: 1. Diffuse marrow edema in the middle and distal phalanges of the
second toe suspicious for osteomyelitis. No other definite
suspicious regions identified.
2. Degenerative findings along the first metatarsal head. Small
erosion or degenerative subcortical cyst medially along the first
metatarsal head.
3. Dorsal subcutaneous edema in the forefoot tracking into the toes.
4. Regional muscular atrophy.

## 2022-06-03 ENCOUNTER — Telehealth: Payer: Self-pay

## 2022-06-03 NOTE — Telephone Encounter (Signed)
Spoke with patient's daughter [name] to inquire if [facility] appointment completed so patient can follow up with Dr. Scot Dock as recommended, since no contact with office has been made. [name] reports patient was seen by Dr. Johnney Ou on 04/17/22 and advised not to do anything with [facility] for now regarding aortogram until kidney function improves. She continues to follow up with the wound care center and will see Dr. Johnney Ou tentatively in November.

## 2022-06-09 ENCOUNTER — Encounter (HOSPITAL_BASED_OUTPATIENT_CLINIC_OR_DEPARTMENT_OTHER): Payer: Self-pay | Admitting: Cardiology

## 2022-06-17 ENCOUNTER — Encounter (HOSPITAL_BASED_OUTPATIENT_CLINIC_OR_DEPARTMENT_OTHER): Payer: Medicare HMO | Attending: Internal Medicine | Admitting: Internal Medicine

## 2022-06-17 DIAGNOSIS — I739 Peripheral vascular disease, unspecified: Secondary | ICD-10-CM | POA: Diagnosis not present

## 2022-06-17 DIAGNOSIS — L97512 Non-pressure chronic ulcer of other part of right foot with fat layer exposed: Secondary | ICD-10-CM | POA: Diagnosis present

## 2022-06-17 DIAGNOSIS — S81801A Unspecified open wound, right lower leg, initial encounter: Secondary | ICD-10-CM

## 2022-06-17 DIAGNOSIS — X58XXXA Exposure to other specified factors, initial encounter: Secondary | ICD-10-CM | POA: Diagnosis not present

## 2022-06-17 DIAGNOSIS — Z89411 Acquired absence of right great toe: Secondary | ICD-10-CM | POA: Insufficient documentation

## 2022-06-17 DIAGNOSIS — Z89421 Acquired absence of other right toe(s): Secondary | ICD-10-CM | POA: Diagnosis not present

## 2022-06-17 DIAGNOSIS — I890 Lymphedema, not elsewhere classified: Secondary | ICD-10-CM

## 2022-06-17 DIAGNOSIS — I4891 Unspecified atrial fibrillation: Secondary | ICD-10-CM | POA: Insufficient documentation

## 2022-06-17 DIAGNOSIS — I872 Venous insufficiency (chronic) (peripheral): Secondary | ICD-10-CM | POA: Insufficient documentation

## 2022-06-17 DIAGNOSIS — Z7901 Long term (current) use of anticoagulants: Secondary | ICD-10-CM | POA: Insufficient documentation

## 2022-06-17 DIAGNOSIS — G9009 Other idiopathic peripheral autonomic neuropathy: Secondary | ICD-10-CM | POA: Insufficient documentation

## 2022-06-17 DIAGNOSIS — M869 Osteomyelitis, unspecified: Secondary | ICD-10-CM | POA: Insufficient documentation

## 2022-06-17 DIAGNOSIS — J449 Chronic obstructive pulmonary disease, unspecified: Secondary | ICD-10-CM | POA: Insufficient documentation

## 2022-06-17 NOTE — Progress Notes (Signed)
AFUA, HOOTS ([id number]) [id number] Page 1 of 9 Visit Report for 06/17/2022 Chief Complaint Document Details Patient Name: Date of Service: [name], [name] 06/17/2022 2:15 PM Medical Record Number: [id number] Patient Account Number: [id number] Date of Birth/Sex: Treating RN: 1928-05-13 (88 y.o. [name] Primary Care Provider: Windy Carina Other Clinician: Referring Provider: Treating Provider/Extender: Theodosia Quay in Treatment: 20 Information Obtained from: Patient Chief Complaint 01/28/2022; right second toe amputation site dehiscence and bilateral lower extremity wounds. Electronic Signature(s) Signed: 06/17/2022 4:32:14 PM By: Kalman Shan DO Entered By: Kalman Shan on 06/17/2022 15:19:04 -------------------------------------------------------------------------------- HPI Details Patient Name: Date of Service: [name]. 06/17/2022 2:15 PM Medical Record Number: [id number] Patient Account Number: [id number] Date of Birth/Sex: Treating RN: 09/24/1927 (88 y.o. [name] Primary Care Provider: Windy Carina Other Clinician: Referring Provider: Treating Provider/Extender: Theodosia Quay in Treatment: 20 History of Present Illness HPI Description: Admission 01/28/2022 [name] is a 88 year old female with a past medical history of idiopathic peripheral neuropathy status post amputation to the second right toe secondary to osteomyelitis, COPD and A-fib on Coumadin the presents to the clinic for a 19-month history of nonhealing ulcer to a previous amputation site on the second right toe. She states she has tried Medihoney and silver alginate in the past to this area with little benefit. She also has 2 small areas limited to skin breakdown to her lower extremities bilaterally. She has chronic venous insufficiency but not has not been wearing her compression stockings. She  states she bumped her legs against an object and not so the wound started. She has been using Medihoney to the sites. She denies signs of infection. 6/2; patient presents for follow-up. She had an x-ray of her right foot done at last clinic visit and this was negative for evidence of osteomyelitis. She also had a wound culture done that showed extra high levels of Staph aureus. I recommended Keystone antibiotics for this and this was ordered. She had ABIs with TBI's done as well that showed monophasic waveforms to the right foot with TBI of 0 and an ABI of 0.52. Urgent referral was made to vein and vascular and she saw Dr. Doren Custard on 6/1, yesterday and he recommended an arteriogram. This is scheduled for 6/16. Patient also reports a new wound to the right great toe. This is a blister that has ruptured. She also reports increased erythema to the toe. 6/6; the patient was worked in urgently today at the insistence of her daughter out of concern for a new wound on the lateral part of the plantar right great toe. She has her original postsurgical wound after the amputation of the right second toe, she has a wound on the medial part of the right great toe. The patient is apparently going for an angiogram by Dr. Doren Custard in 2 weeks time. 6/13; patient presents for follow-up. She has been using bacitracin to the abrasion on the right great toe. She has been using collagen and Keystone antibiotic to the amputation site. She has no issues or complaints today. She denies signs of infection. 6/22; patient presents for follow-up. She states that her abdominal aortogram was canceled due to her renal function. She has been using Keystone antibiotics to the amputation site and Medihoney to the right great toe wound. At the pace of the right great toe she has a slitlike open area that she thinks was caused by the tape from the dressing. 6/29; patient presents  for follow-up. She has been using Keystone antibiotics to the  amputation site along with collagen. She has been using Medihoney to the right great toe wound. She has no other wounds. She denies signs of infection. 7/13; patient presents for follow-up. She has been using Keystone antibiotics and collagen to the wound sites. She currently denies signs of infection. She states she is scheduled to see me nephrology next month. [name] ([id number]) [id number] Page 2 of 9 7/20; patient presents for follow-up. She continue Keystone antibiotics and collagen to the wound sites. She has no issues or complaints today. 8/1; patient presents for follow up. She continues to use keystone antibiotics and collagen to the wound sites. She has no issues or complaints today. 8/15; patient presents for follow-up. She has been using Keystone antibiotics and collagen to the wound sites. She followed up with her nephrologist who made medication changes. She is supposed to get a repeat BMP in 2 weeks. Her decrease in renal function was a limiting factor in obtaining an arteriogram for potential intervention for revascularization. She currently denies signs of infection. 9/2; patient presents for follow-up. She has been using Keystone antibiotic and collagen to the wound beds. She has no issues or complaints today. Reports there has been improvement in kidney function however not cleared to have her arteriogram just yet. 10/10; patient presents for follow-up. She has been using Keystone antibiotic and collagen to the wound beds. She reports 2 new wounds 1 to the anterior right lower extremity and another to the plantar aspect of the right foot. She states that the right plantar foot wound was caused by the home health nurse changing the dressing and causing a skin tear. She is not sure how the right anterior leg wound started. It appears to be from trauma. She denies signs of infection. Electronic Signature(s) Signed: 06/17/2022 4:32:14 PM By: Kalman Shan DO Entered By: Kalman Shan on 06/17/2022 15:20:51 -------------------------------------------------------------------------------- Physical Exam Details Patient Name: Date of Service: [name]. 06/17/2022 2:15 PM Medical Record Number: [id number] Patient Account Number: [id number] Date of Birth/Sex: Treating RN: 09-05-1928 (88 y.o. [name] Primary Care Provider: Windy Carina Other Clinician: Referring Provider: Treating Provider/Extender: Theodosia Quay in Treatment: 20 Constitutional respirations regular, non-labored and within target range for patient.. Cardiovascular 2+ dorsalis pedis/posterior tibialis pulses. Psychiatric pleasant and cooperative. Notes Right lower extremity: T the second digit there is an amputation site with that appears healed. T the lateral aspect of the great toe there is an open wound with o o pale granulation tissue. T the anterior aspect there is an open wound with granulation tissue and tightly adhered nonviable tissue. T the plantar foot wound o o there is an area of skin breakdown to the first met head. Electronic Signature(s) Signed: 06/17/2022 4:32:14 PM By: Kalman Shan DO Entered By: Kalman Shan on 06/17/2022 15:30:41 -------------------------------------------------------------------------------- Physician Orders Details Patient Name: Date of Service: [name]. 06/17/2022 2:15 PM Medical Record Number: [id number] Patient Account Number: [id number] Date of Birth/Sex: Treating RN: [birth year] (88 y.o. [name] Primary Care Provider: Windy Carina Other Clinician: Referring Provider: Treating Provider/Extender: Theodosia Quay in Treatment: 20 Verbal / Phone Orders: No Diagnosis Coding Follow-up Appointments ppointment in 1 week. - with Dr. Heber [place] and [address], [place] M ([id number]) [id number] Page 3 of  9 Edema Control - Lymphedema / SCD / Other Elevate legs to the level of the heart or above for 30  minutes daily and/or when sitting, a frequency of: - throughout the day. Avoid standing for long periods of time. Home Health New wound care orders this week; continue Home Health for wound care. May utilize formulary equivalent dressing for wound treatment orders unless otherwise specified. - Home health weekly dressing changes. All other days daughter to change. All wounds keystone topical antibiotics and Prisma left great toe. RIght lower leg alginate and bordered foam. right first met head alginate and foam donut. tubigrip size E one layer to right foot and leg. Other Home Health Orders/Instructions: - [place]. Wound Treatment Wound #5 - T Great oe Wound Laterality: Right Cleanser: Soap and Water (Home Health) 1 x Per Day/30 Days Discharge Instructions: May shower and wash wound with dial antibacterial soap and water prior to dressing change. Cleanser: Wound Cleanser (Home Health) 1 x Per Day/30 Days Discharge Instructions: Cleanse the wound with wound cleanser prior to applying a clean dressing using gauze sponges, not tissue or cotton balls. Topical: Compounding T opical Antibiotics (Home Health) 1 x Per Day/30 Days Discharge Instructions: Apply T opical Antibiotics from [facility] daily once it arrives, applies over the [place]. Prim Dressing: Promogran Prisma Matrix, 4.34 (sq in) (silver collagen) (Home Health) 1 x Per Day/30 Days ary Discharge Instructions: Moisten collagen with saline or hydrogel directly to wound bed. apply over the [place] when arrives. Secondary Dressing: Woven Gauze Sponges 2x2 in (Home Health) 1 x Per Day/30 Days Discharge Instructions: Apply over primary dressing. may tape down with medipore tape. Secured With: Child psychotherapist, Sterile 2x75 (in/in) (Home Health) 1 x Per Day/30 Days Discharge Instructions: Secure with stretch  gauze or use paper tape to secure the gauze. Secured With: Paper Tape, 2x10 (in/yd) (Home Health) 1 x Per Day/30 Days Discharge Instructions: Secure dressing with tape as directed. Wound #6 - Lower Leg Wound Laterality: Right, Anterior Cleanser: Wound Cleanser (Home Health) 1 x Per Day/30 Days Discharge Instructions: Cleanse the wound with wound cleanser prior to applying a clean dressing using gauze sponges, not tissue or cotton balls. Prim Dressing: KerraCel Ag Gelling Fiber Dressing, 2x2 in (silver alginate) (Home Health) 1 x Per Day/30 Days ary Discharge Instructions: Apply silver alginate to wound bed as instructed Secondary Dressing: Zetuvit Plus Silicone Border Dressing 4x4 (in/in) ([place]) 1 x Per Day/30 Days Discharge Instructions: Apply silicone border over primary dressing as directed. Compression Wrap: Tubigrip size E 1 x Per Day/30 Days Discharge Instructions: one layer only. Wound #7 - Metatarsal head first Wound Laterality: Right Cleanser: Wound Cleanser (Home Health) 1 x Per Day/30 Days Discharge Instructions: Cleanse the wound with wound cleanser prior to applying a clean dressing using gauze sponges, not tissue or cotton balls. Prim Dressing: KerraCel Ag Gelling Fiber Dressing, 2x2 in (silver alginate) (Home Health) 1 x Per Day/30 Days ary Discharge Instructions: Apply silver alginate to wound bed as instructed Secondary Dressing: Woven Gauze Sponges 2x2 in 1 x Per Day/30 Days Discharge Instructions: Apply over primary dressing as directed. Secondary Dressing: callous pad (patient's daughter to purchase over the counter) ([place]) 1 x Per Day/30 Days Discharge Instructions: apply to periwound. Secured With: 69M Medipore H Soft Cloth Surgical T ape, 4 x 10 (in/yd) (Home Health) 1 x Per Day/30 Days Discharge Instructions: Secure with tape as directed. Electronic Signature(s) Signed: 06/17/2022 4:32:14 PM By: Kalman Shan DO Entered By: Kalman Shan on  06/17/2022 15:31:11 Vipond, Marylynn Pearson ([id number]) [id number] Page 4 of 9 -------------------------------------------------------------------------------- Problem List Details Patient Name: Date  of Service: DUSTI, TETRO 06/17/2022 2:15 PM Medical Record Number: [id number] Patient Account Number: [id number] Date of Birth/Sex: Treating RN: [birth year] (88 y.o. [name], Tammi Klippel Primary Care Provider: Windy Carina Other Clinician: Referring Provider: Treating Provider/Extender: Theodosia Quay in Treatment: 20 Active Problems ICD-10 Encounter Code Description Active Date MDM Diagnosis L97.512 Non-pressure chronic ulcer of other part of right foot with fat layer exposed 01/28/2022 No Yes G90.09 Other idiopathic peripheral autonomic neuropathy 01/28/2022 No Yes M86.9 Osteomyelitis, unspecified 01/28/2022 No Yes I48.91 Unspecified atrial fibrillation 01/28/2022 No Yes Z79.01 Long term (current) use of anticoagulants 01/28/2022 No Yes I89.0 Lymphedema, not elsewhere classified 01/28/2022 No Yes I87.2 Venous insufficiency (chronic) (peripheral) 01/28/2022 No Yes J44.9 Chronic obstructive pulmonary disease, unspecified 01/28/2022 No Yes I73.9 Peripheral vascular disease, unspecified 03/27/2022 No Yes S81.801A Unspecified open wound, right lower leg, initial encounter 06/17/2022 No Yes Inactive Problems ICD-10 Code Description Active Date Inactive Date L97.812 Non-pressure chronic ulcer of other part of right lower leg with fat layer exposed 01/28/2022 01/28/2022 Resolved Problems ICD-10 Code Description Active Date Resolved Date L97.822 Non-pressure chronic ulcer of other part of left lower leg with fat layer exposed 01/28/2022 01/28/2022 Dionisio, Wynonia M ([id number]) [id number] Page 5 of 9 Electronic Signature(s) Signed: 06/17/2022 4:32:14 PM By: Kalman Shan DO Entered By: Kalman Shan on 06/17/2022  15:18:47 -------------------------------------------------------------------------------- Progress Note Details Patient Name: Date of Service: [name]. 06/17/2022 2:15 PM Medical Record Number: [id number] Patient Account Number: [id number] Date of Birth/Sex: Treating RN: 09-Oct-1927 (88 y.o. [name] Primary Care Provider: Windy Carina Other Clinician: Referring Provider: Treating Provider/Extender: Theodosia Quay in Treatment: 20 Subjective Chief Complaint Information obtained from Patient 01/28/2022; right second toe amputation site dehiscence and bilateral lower extremity wounds. History of Present Illness (HPI) Admission 01/28/2022 Ms. Jasara Corrigan is a 88 year old female with a past medical history of idiopathic peripheral neuropathy status post amputation to the second right toe secondary to osteomyelitis, COPD and A-fib on Coumadin the presents to the clinic for a 24-month history of nonhealing ulcer to a previous amputation site on the second right toe. She states she has tried Medihoney and silver alginate in the past to this area with little benefit. She also has 2 small areas limited to skin breakdown to her lower extremities bilaterally. She has chronic venous insufficiency but not has not been wearing her compression stockings. She states she bumped her legs against an object and not so the wound started. She has been using Medihoney to the sites. She denies signs of infection. 6/2; patient presents for follow-up. She had an x-ray of her right foot done at last clinic visit and this was negative for evidence of osteomyelitis. She also had a wound culture done that showed extra high levels of Staph aureus. I recommended Keystone antibiotics for this and this was ordered. She had ABIs with TBI's done as well that showed monophasic waveforms to the right foot with TBI of 0 and an ABI of 0.52. Urgent referral was made to vein and vascular and she  saw Dr. Doren Custard on 6/1, yesterday and he recommended an arteriogram. This is scheduled for 6/16. Patient also reports a new wound to the right great toe. This is a blister that has ruptured. She also reports increased erythema to the toe. 6/6; the patient was worked in urgently today at the insistence of her daughter out of concern for a new wound on the lateral part of the plantar right great toe. She  has her original postsurgical wound after the amputation of the right second toe, she has a wound on the medial part of the right great toe. The patient is apparently going for an angiogram by Dr. Doren Custard in 2 weeks time. 6/13; patient presents for follow-up. She has been using bacitracin to the abrasion on the right great toe. She has been using collagen and Keystone antibiotic to the amputation site. She has no issues or complaints today. She denies signs of infection. 6/22; patient presents for follow-up. She states that her abdominal aortogram was canceled due to her renal function. She has been using Keystone antibiotics to the amputation site and Medihoney to the right great toe wound. At the pace of the right great toe she has a slitlike open area that she thinks was caused by the tape from the dressing. 6/29; patient presents for follow-up. She has been using Keystone antibiotics to the amputation site along with collagen. She has been using Medihoney to the right great toe wound. She has no other wounds. She denies signs of infection. 7/13; patient presents for follow-up. She has been using Keystone antibiotics and collagen to the wound sites. She currently denies signs of infection. She states she is scheduled to see me nephrology next month. 7/20; patient presents for follow-up. She continue Keystone antibiotics and collagen to the wound sites. She has no issues or complaints today. 8/1; patient presents for follow up. She continues to use keystone antibiotics and collagen to the wound sites. She  has no issues or complaints today. 8/15; patient presents for follow-up. She has been using Keystone antibiotics and collagen to the wound sites. She followed up with her nephrologist who made medication changes. She is supposed to get a repeat BMP in 2 weeks. Her decrease in renal function was a limiting factor in obtaining an arteriogram for potential intervention for revascularization. She currently denies signs of infection. 9/2; patient presents for follow-up. She has been using Keystone antibiotic and collagen to the wound beds. She has no issues or complaints today. Reports there has been improvement in kidney function however not cleared to have her arteriogram just yet. 10/10; patient presents for follow-up. She has been using Keystone antibiotic and collagen to the wound beds. She reports 2 new wounds 1 to the anterior right lower extremity and another to the plantar aspect of the right foot. She states that the right plantar foot wound was caused by the home health nurse changing the dressing and causing a skin tear. She is not sure how the right anterior leg wound started. It appears to be from trauma. She denies signs of infection. Patient History Information obtained from Patient, Caregiver. Family History Cancer - Siblings, Heart Disease, Stroke - Mother, No family history of Diabetes. ASLAN, [place] ([id number]) [id number] Page 6 of 9 Social History Former smoker - quit 2003, Marital Status - Widowed, Alcohol Use - Never, Drug Use - No History, Caffeine Use - Never. Medical History Eyes Patient has history of Cataracts Hematologic/Lymphatic Patient has history of Anemia Respiratory Denies history of Chronic Obstructive Pulmonary Disease (COPD) Cardiovascular Patient has history of Congestive Heart Failure, Hypotension, Peripheral Venous Disease Musculoskeletal Patient has history of Osteomyelitis - right foot second toe  amputated Neurologic Patient has history of Neuropathy Medical A Surgical History Notes nd Hematologic/Lymphatic Thrombocytopenia Endocrine Hyperthyroidism, Hypothyroidism Objective Constitutional respirations regular, non-labored and within target range for patient.. Vitals Time Taken: 2:54 PM, Height: 62 in, Weight: 181 lbs, BMI: 33.1, Temperature: 97.9 F, Pulse:  69 bpm, Respiratory Rate: 18 breaths/min, Blood Pressure: 110/68 mmHg. Cardiovascular 2+ dorsalis pedis/posterior tibialis pulses. Psychiatric pleasant and cooperative. General Notes: Right lower extremity: T the second digit there is an amputation site with that appears healed. T the lateral aspect of the great toe there is an o o open wound with pale granulation tissue. T the anterior aspect there is an open wound with granulation tissue and tightly adhered nonviable tissue. T the o o plantar foot wound there is an area of skin breakdown to the first met head. Integumentary (Hair, Skin) Wound #3 status is Open. Original cause of wound was Surgical Injury. The date acquired was: 11/17/2021. The wound has been in treatment 20 weeks. The wound is located on the Right Amputation Site - T The wound measures 0cm length x 0cm width x 0cm depth; 0cm^2 area and 0cm^3 volume. There is no oe. tunneling or undermining noted. There is a none present amount of drainage noted. The wound margin is epibole. There is no granulation within the wound bed. There is no necrotic tissue within the wound bed. Wound #5 status is Open. Original cause of wound was Shear/Friction. The date acquired was: 02/06/2022. The wound has been in treatment 18 weeks. The wound is located on the Right T Great. The wound measures 0.3cm length x 0.5cm width x 0.2cm depth; 0.118cm^2 area and 0.024cm^3 volume. There is Fat oe Layer (Subcutaneous Tissue) exposed. There is a medium amount of serosanguineous drainage noted. The wound margin is distinct with the outline  attached to the wound base. There is large (67-100%) pink granulation within the wound bed. There is no necrotic tissue within the wound bed. Wound #6 status is Open. Original cause of wound was Bump. The date acquired was: 06/16/2022. The wound is located on the Right,Anterior Lower Leg. The wound measures 0.5cm length x 1cm width x 0.1cm depth; 0.393cm^2 area and 0.039cm^3 volume. There is Fat Layer (Subcutaneous Tissue) exposed. There is no tunneling or undermining noted. There is a medium amount of serosanguineous drainage noted. The wound margin is distinct with the outline attached to the wound base. There is large (67-100%) red granulation within the wound bed. There is no necrotic tissue within the wound bed. The periwound skin appearance did not exhibit: Callus, Crepitus, Excoriation, Induration, Rash, Scarring, Dry/Scaly, Maceration, Atrophie Blanche, Cyanosis, Ecchymosis, Hemosiderin Staining, Mottled, Pallor, Rubor, Erythema. Wound #7 status is Open. Original cause of wound was Skin T ear/Laceration. The date acquired was: 06/13/2022. The wound is located on the Right Metatarsal head first. The wound measures 0.5cm length x 0.2cm width x 0.1cm depth; 0.079cm^2 area and 0.008cm^3 volume. There is Fat Layer (Subcutaneous Tissue) exposed. There is no tunneling or undermining noted. There is a medium amount of serosanguineous drainage noted. The wound margin is distinct with the outline attached to the wound base. There is large (67-100%) pink, pale granulation within the wound bed. There is no necrotic tissue within the wound bed. The periwound skin appearance did not exhibit: Callus, Crepitus, Excoriation, Induration, Rash, Scarring, Dry/Scaly, Maceration, Atrophie Blanche, Cyanosis, Ecchymosis, Hemosiderin Staining, Mottled, Pallor, Rubor, Erythema. 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 idiopathic peripheral  autonomic neuropathy Osteomyelitis, unspecified Unspecified atrial fibrillation Zwart, Carley M ([id number]) [id number] Page 7 of 9 Long term (current) use of anticoagulants Lymphedema, not elsewhere classified Venous insufficiency (chronic) (peripheral) Chronic obstructive pulmonary disease, unspecified Peripheral vascular disease, unspecified Unspecified open wound, right lower leg, initial encounter  The second amputation toe site appears healed. The right lateral great toe wound is still present and I recommended continue with Keystone antibiotic ointment and collagen to this area. She has 2 new wounds that are related to trauma. I recommended silver alginate to these areas and aggressive offloading to the plantar foot wound. I recommended Tubigrip. She has severe peripheral arterial disease to the right leg. She is currently not a candidate for revascularization to this lower extremity. Plan Follow-up Appointments: Return Appointment in 1 week. - with Dr. Heber [place] and Tammi Klippel Edema Control - Lymphedema / SCD / Other: Elevate legs to the level of the heart or above for 30 minutes daily and/or when sitting, a frequency of: - throughout the day. Avoid standing for long periods of time. Home Health: New wound care orders this week; continue Home Health for wound care. May utilize formulary equivalent dressing for wound treatment orders unless otherwise specified. - Home health weekly dressing changes. All other days daughter to change. All wounds keystone topical antibiotics and Prisma left great toe. RIght lower leg alginate and bordered foam. right first met head alginate and foam donut. tubigrip size E one layer to right foot and leg. Other Home Health Orders/Instructions: - [place]. WOUND #5: - T Great Wound Laterality: Right oe Cleanser: Soap and Water (Home Health) 1 x Per Day/30 Days Discharge Instructions: May shower and wash wound with dial  antibacterial soap and water prior to dressing change. Cleanser: Wound Cleanser (Home Health) 1 x Per Day/30 Days Discharge Instructions: Cleanse the wound with wound cleanser prior to applying a clean dressing using gauze sponges, not tissue or cotton balls. Topical: Compounding T opical Antibiotics (Home Health) 1 x Per Day/30 Days Discharge Instructions: Apply T opical Antibiotics from [facility] daily once it arrives, applies over the [place]. Prim Dressing: Promogran Prisma Matrix, 4.34 (sq in) (silver collagen) (Home Health) 1 x Per Day/30 Days ary Discharge Instructions: Moisten collagen with saline or hydrogel directly to wound bed. apply over the [place] when arrives. Secondary Dressing: Woven Gauze Sponges 2x2 in (Home Health) 1 x Per Day/30 Days Discharge Instructions: Apply over primary dressing. may tape down with medipore tape. Secured With: Child psychotherapist, Sterile 2x75 (in/in) (Home Health) 1 x Per Day/30 Days Discharge Instructions: Secure with stretch gauze or use paper tape to secure the gauze. Secured With: Paper T ape, 2x10 (in/yd) ([place]) 1 x Per Day/30 Days Discharge Instructions: Secure dressing with tape as directed. WOUND #6: - Lower Leg Wound Laterality: Right, Anterior Cleanser: Wound Cleanser (Home Health) 1 x Per Day/30 Days Discharge Instructions: Cleanse the wound with wound cleanser prior to applying a clean dressing using gauze sponges, not tissue or cotton balls. Prim Dressing: KerraCel Ag Gelling Fiber Dressing, 2x2 in (silver alginate) (Home Health) 1 x Per Day/30 Days ary Discharge Instructions: Apply silver alginate to wound bed as instructed Secondary Dressing: Zetuvit Plus Silicone Border Dressing 4x4 (in/in) ([place]) 1 x Per Day/30 Days Discharge Instructions: Apply silicone border over primary dressing as directed. Com pression Wrap: Tubigrip size E 1 x Per Day/30 Days Discharge Instructions: one layer only. WOUND  #7: - Metatarsal head first Wound Laterality: Right Cleanser: Wound Cleanser (Home Health) 1 x Per Day/30 Days Discharge Instructions: Cleanse the wound with wound cleanser prior to applying a clean dressing using gauze sponges, not tissue or cotton balls. Prim Dressing: KerraCel Ag Gelling Fiber Dressing, 2x2 in (silver alginate) (Home Health) 1 x Per Day/30 Days ary Discharge  Instructions: Apply silver alginate to wound bed as instructed Secondary Dressing: Woven Gauze Sponges 2x2 in 1 x Per Day/30 Days Discharge Instructions: Apply over primary dressing as directed. Secondary Dressing: callous pad (patient's daughter to purchase over the counter) ([place]) 1 x Per Day/30 Days Discharge Instructions: apply to periwound. Secured With: 44M Medipore H Soft Cloth Surgical T ape, 4 x 10 (in/yd) (Home Health) 1 x Per Day/30 Days Discharge Instructions: Secure with tape as directed. 1. Keystone antibiotic and collagen 2. Silver alginate 3. Aggressive offloading 4. Follow-up in 1 week Electronic Signature(s) Signed: 06/17/2022 4:32:14 PM By: Kalman Shan DO Entered By: Kalman Shan on 06/17/2022 15:32:49 Vital, Marylynn Pearson ([id number]) [id number] Page 8 of 9 -------------------------------------------------------------------------------- HxROS Details Patient Name: Date of Service: AZZIE, THIEM 06/17/2022 2:15 PM Medical Record Number: [id number] Patient Account Number: [id number] Date of Birth/Sex: Treating RN: 1928-06-24 (88 y.o. [name] Primary Care Provider: Windy Carina Other Clinician: Referring Provider: Treating Provider/Extender: Theodosia Quay in Treatment: 20 Information Obtained From Patient Caregiver Eyes Medical History: Positive for: Cataracts Hematologic/Lymphatic Medical History: Positive for: Anemia Past Medical History Notes: Thrombocytopenia Respiratory Medical History: Negative for:  Chronic Obstructive Pulmonary Disease (COPD) Cardiovascular Medical History: Positive for: Congestive Heart Failure; Hypotension; Peripheral Venous Disease Endocrine Medical History: Past Medical History Notes: Hyperthyroidism, Hypothyroidism Musculoskeletal Medical History: Positive for: 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 Neurologic Medical History: Positive for: Neuropathy HBO Extended History Items Eyes: Cataracts Immunizations Pneumococcal Vaccine: Received Pneumococcal Vaccination: No Implantable Devices None Family and Social History Cancer: Yes - Siblings; Diabetes: No; Heart Disease: Yes; Stroke: Yes - Mother; Former smoker - quit 2003; Marital Status - Widowed; Alcohol Use: Never; Drug Use: No History; Caffeine Use: Never; Financial Concerns: No; Food, Clothing or Shelter Needs: No; Support System Lacking: No; Transportation Concerns: No Electronic Signature(s) Signed: 06/17/2022 4:32:14 PM By: Kalman Shan DO Signed: 06/17/2022 6:12:04 PM By: Deon Pilling RN, BSN Entered By: Kalman Shan on 06/17/2022 15:20:56 Lajeunesse, Marylynn Pearson ([id number]) [id number] Page 9 of 9 -------------------------------------------------------------------------------- SuperBill Details Patient Name: Date of Service: KARLE, DESROSIER 06/17/2022 Medical Record Number: [id number] Patient Account Number: [id number] Date of Birth/Sex: Treating RN: 01-26-1928 (88 y.o. [name], Tammi Klippel Primary Care Provider: Windy Carina Other Clinician: Referring Provider: Treating Provider/Extender: Theodosia Quay in Treatment: 20 Diagnosis Coding ICD-10 Codes Code Description [phone] Non-pressure chronic ulcer of other part of right foot with fat layer exposed G90.09 Other idiopathic peripheral autonomic neuropathy M86.9 Osteomyelitis, unspecified I48.91 Unspecified atrial fibrillation Z79.01 Long term (current) use of  anticoagulants I89.0 Lymphedema, not elsewhere classified I87.2 Venous insufficiency (chronic) (peripheral) J44.9 Chronic obstructive pulmonary disease, unspecified I73.9 Peripheral vascular disease, unspecified S81.801A Unspecified open wound, right lower leg, initial encounter Facility Procedures : CPT4 Code: [id number] Description: [place] VISIT-LEV 5 EST PT Modifier: Quantity: 1 Physician Procedures : CPT4 Code Description Modifier [id number] - WC PHYS LEVEL 3 - EST PT ICD-10 Diagnosis Description L97.512 Non-pressure chronic ulcer of other part of right foot with fat layer exposed I89.0 Lymphedema, not elsewhere classified S81.801A Unspecified  open wound, right lower leg, initial encounter I73.9 Peripheral vascular disease, unspecified Quantity: 1 Electronic Signature(s) Signed: 06/17/2022 4:32:14 PM By: Kalman Shan DO Entered By: Kalman Shan on 06/17/2022 15:33:22

## 2022-06-26 ENCOUNTER — Encounter (HOSPITAL_BASED_OUTPATIENT_CLINIC_OR_DEPARTMENT_OTHER): Payer: Medicare HMO | Admitting: Internal Medicine

## 2022-06-26 DIAGNOSIS — I739 Peripheral vascular disease, unspecified: Secondary | ICD-10-CM | POA: Diagnosis not present

## 2022-06-26 DIAGNOSIS — I872 Venous insufficiency (chronic) (peripheral): Secondary | ICD-10-CM

## 2022-06-26 DIAGNOSIS — L97512 Non-pressure chronic ulcer of other part of right foot with fat layer exposed: Secondary | ICD-10-CM | POA: Diagnosis not present

## 2022-06-26 DIAGNOSIS — S81801A Unspecified open wound, right lower leg, initial encounter: Secondary | ICD-10-CM | POA: Diagnosis not present

## 2022-06-26 NOTE — Progress Notes (Signed)
SHIREE, ALTEMUS ([id number]) [id number] Page 1 of 9 Visit Report for 06/26/2022 Chief Complaint Document Details Patient Name: Date of Service: [name] [name]. 06/26/2022 1:30 PM Medical Record Number: [id number] Patient Account Number: [id number] Date of Birth/Sex: Treating RN: 12-04-1927 (88 y.o. F) Primary Care Provider: Jorge [name] Other Clinician: Referring Provider: Treating Provider/Extender: [name] [name] in Treatment: 21 Information Obtained from: Patient Chief Complaint 01/28/2022; right second toe amputation site dehiscence and bilateral lower extremity wounds. Electronic Signature(s) Signed: 06/26/2022 4:16:28 PM By: Geralyn Corwin DO Entered By: Geralyn Corwin on 06/26/2022 14:11:54 -------------------------------------------------------------------------------- HPI Details Patient Name: Date of Service: [name] [name] TTIE M. 06/26/2022 1:30 PM Medical Record Number: [id number] Patient Account Number: [id number] Date of Birth/Sex: Treating RN: 1928-05-20 (88 y.o. F) Primary Care Provider: Jorge [name] Other Clinician: Referring Provider: Treating Provider/Extender: [name] [name] in Treatment: 21 History of Present Illness HPI Description: Admission 01/28/2022 [name] [name] is a 88 year old female with a past medical history of idiopathic peripheral neuropathy status post amputation to the second right toe secondary to osteomyelitis, COPD and A-fib on Coumadin the presents to the clinic for a 74-month history of nonhealing ulcer to a previous amputation site on the second right toe. She states she has tried Medihoney and silver alginate in the past to this area with little benefit. She also has 2 small areas limited to skin breakdown to her lower extremities bilaterally. She has chronic venous insufficiency but not has not been wearing her compression stockings. She states she bumped her legs against  an object and not so the wound started. She has been using Medihoney to the sites. She denies signs of infection. 6/2; patient presents for follow-up. She had an x-ray of her right foot done at last clinic visit and this was negative for evidence of osteomyelitis. She also had a wound culture done that showed extra high levels of Staph aureus. I recommended Keystone antibiotics for this and this was ordered. She had ABIs with TBI's done as well that showed monophasic waveforms to the right foot with TBI of 0 and an ABI of 0.52. Urgent referral was made to vein and vascular and she saw Dr. Durwin Nora on 6/1, yesterday and he recommended an arteriogram. This is scheduled for 6/16. Patient also reports a new wound to the right great toe. This is a blister that has ruptured. She also reports increased erythema to the toe. 6/6; the patient was worked in urgently today at the insistence of her daughter out of concern for a new wound on the lateral part of the plantar right great toe. She has her original postsurgical wound after the amputation of the right second toe, she has a wound on the medial part of the right great toe. The patient is apparently going for an angiogram by Dr. Durwin Nora in 2 [name] time. 6/13; patient presents for follow-up. She has been using bacitracin to the abrasion on the right great toe. She has been using collagen and Keystone antibiotic to the amputation site. She has no issues or complaints today. She denies signs of infection. 6/22; patient presents for follow-up. She states that her abdominal aortogram was canceled due to her renal function. She has been using Keystone antibiotics to the amputation site and Medihoney to the right great toe wound. At the pace of the right great toe she has a slitlike open area that she thinks was caused by the tape from the dressing. 6/29; patient presents for follow-up. She has  been using Keystone antibiotics to the amputation site along with collagen.  She has been using Medihoney to the right great toe wound. She has no other wounds. She denies signs of infection. 7/13; patient presents for follow-up. She has been using Keystone antibiotics and collagen to the wound sites. She currently denies signs of infection. She states she is scheduled to see me nephrology next month. [name] [name] ([id number]) [id number] Page 2 of 9 7/20; patient presents for follow-up. She continue Keystone antibiotics and collagen to the wound sites. She has no issues or complaints today. 8/1; patient presents for follow up. She continues to use keystone antibiotics and collagen to the wound sites. She has no issues or complaints today. 8/15; patient presents for follow-up. She has been using Keystone antibiotics and collagen to the wound sites. She followed up with her nephrologist who made medication changes. She is supposed to get a repeat BMP in 2 [name]. Her decrease in renal function was a limiting factor in obtaining an arteriogram for potential intervention for revascularization. She currently denies signs of infection. 9/2; patient presents for follow-up. She has been using Keystone antibiotic and collagen to the wound beds. She has no issues or complaints today. Reports there has been improvement in kidney function however not cleared to have her arteriogram just yet. 10/10; patient presents for follow-up. She has been using Keystone antibiotic and collagen to the wound beds. She reports 2 new wounds 1 to the anterior right lower extremity and another to the plantar aspect of the right foot. She states that the right plantar foot wound was caused by the home health nurse changing the dressing and causing a skin tear. She is not sure how the right anterior leg wound started. It appears to be from trauma. She denies signs of infection. 10/19; patient presents for follow-up. She has been using Keystone antibiotic and collagen to the right  great toe wound. She is been using silver alginate to the right anterior and right plantar foot wound. She has been using Tubigrip to the right lower extremity. The plantar foot wound has healed. She has no issues or complaints today. Electronic Signature(s) Signed: 06/26/2022 4:16:28 PM By: Geralyn Corwin DO Entered By: Geralyn Corwin on 06/26/2022 14:12:42 -------------------------------------------------------------------------------- Physical Exam Details Patient Name: Date of Service: Bowring, Gaylyn Rong TTIE M. 06/26/2022 1:30 PM Medical Record Number: [id number] Patient Account Number: [id number] Date of Birth/Sex: Treating RN: 12-Aug-1928 (88 y.o. F) Primary Care Provider: Jorge [name] Other Clinician: Referring Provider: Treating Provider/Extender: [name] [name] in Treatment: 21 Constitutional respirations regular, non-labored and within target range for patient.. Cardiovascular 2+ dorsalis pedis/posterior tibialis pulses. Psychiatric pleasant and cooperative. Notes Right lower extremity: T the lateral aspect of the great toe there is an open wound with pale granulation tissue. T the anterior aspect there is an open wound o o with granulation tissue and tightly adhered nonviable tissue. T the plantar foot wound there is epithelialization to the previous wound site. o Electronic Signature(s) Signed: 06/26/2022 4:16:28 PM By: Geralyn Corwin DO Entered By: Geralyn Corwin on 06/26/2022 14:17:17 -------------------------------------------------------------------------------- Physician Orders Details Patient Name: Date of Service: [name] 06/26/2022 1:30 PM Medical Record Number: [id number] Patient Account Number: [id number] Date of Birth/Sex: Treating RN: May 12, 1928 (88 y.o. [name] Primary Care Provider: Jorge [name] Other Clinician: Referring Provider: Treating Provider/Extender: [name] [name] in Treatment:  21 Verbal / Phone Orders: No Diagnosis [address] NALIAH, [place]DINGTON ([id number]) [id number] Page 3 of 9 ICD-10  Coding Code Description L97.512 Non-pressure chronic ulcer of other part of right foot with fat layer exposed G90.09 Other idiopathic peripheral autonomic neuropathy M86.9 Osteomyelitis, unspecified I48.91 Unspecified atrial fibrillation Z79.01 Long term (current) use of anticoagulants I89.0 Lymphedema, not elsewhere classified I87.2 Venous insufficiency (chronic) (peripheral) J44.9 Chronic obstructive pulmonary disease, unspecified I73.9 Peripheral vascular disease, unspecified S81.801A Unspecified open wound, right lower leg, initial encounter Follow-up Appointments Return appointment in 3 [name]. - with Dr. Mikey Bussing and [name] Anesthetic (In clinic) Topical Lidocaine 5% applied to wound bed Edema Control - Lymphedema / SCD / Other Elevate legs to the level of the heart or above for 30 minutes daily and/or when sitting, a frequency of: - throughout the day. Avoid standing for long periods of time. Off-Loading Open toe surgical shoe to: - while sitting and standing. Home Health New wound care orders this week; continue Home Health for wound care. May utilize formulary equivalent dressing for wound treatment orders unless otherwise specified. - Home health weekly dressing changes. Medihoney to all wounds. may apply medihoney to right 5th toe abrasion. Other Home Health Orders/Instructions: - [facility]. Wound Treatment Wound #5 - T Great oe Wound Laterality: Right Cleanser: Soap and Water (Home Health) 1 x Per Day/30 Days Discharge Instructions: May shower and wash wound with dial antibacterial soap and water prior to dressing change. Cleanser: Wound Cleanser (Home Health) 1 x Per Day/30 Days Discharge Instructions: Cleanse the wound with wound cleanser prior to applying a clean dressing using gauze sponges, not tissue or cotton balls. Prim  Dressing: MediHoney Gel, tube 1.5 (oz) (Home Health) 1 x Per Day/30 Days ary Discharge Instructions: Apply to wound bed as instructed Secondary Dressing: Woven Gauze Sponges 2x2 in (Home Health) 1 x Per Day/30 Days Discharge Instructions: Apply over primary dressing. may tape down with medipore tape. Secured With: Insurance underwriter, Sterile 2x75 (in/in) (Home Health) 1 x Per Day/30 Days Discharge Instructions: Secure with stretch gauze or use paper tape to secure the gauze. Secured With: Paper Tape, 2x10 (in/yd) (Home Health) 1 x Per Day/30 Days Discharge Instructions: Secure dressing with tape as directed. Wound #6 - Lower Leg Wound Laterality: Right, Anterior Cleanser: Wound Cleanser (Home Health) 1 x Per Day/30 Days Discharge Instructions: Cleanse the wound with wound cleanser prior to applying a clean dressing using gauze sponges, not tissue or cotton balls. Prim Dressing: KerraCel Ag Gelling Fiber Dressing, 2x2 in (silver alginate) (Home Health) 1 x Per Day/30 Days ary Discharge Instructions: Apply silver alginate to wound bed as instructed Prim Dressing: MediHoney Gel, tube 1.5 (oz) (Home Health) 1 x Per Day/30 Days ary Discharge Instructions: Apply to wound bed as instructed Secondary Dressing: Zetuvit Plus Silicone Border Dressing 4x4 (in/in) (Home Health) 1 x Per Day/30 Days Discharge Instructions: Apply silicone border over primary dressing as directed. Compression Wrap: Tubigrip size E 1 x Per Day/30 Days Discharge Instructions: one layer only. Electronic Signature(s) Signed: 06/26/2022 4:16:28 PM By: Geralyn Corwin DO Entered By: Geralyn Corwin on 06/26/2022 14:18:57 Reading, [name] ([id number]) [id number] Page 4 of 9 -------------------------------------------------------------------------------- Problem List Details Patient Name: Date of Service: ORABELLE, RYLEE 06/26/2022 1:30 PM Medical Record Number: [id number] Patient  Account Number: [id number] Date of Birth/Sex: Treating RN: 1928/06/16 (88 y.o. [name] Primary Care Provider: Jorge [name] Other Clinician: Referring Provider: Treating Provider/Extender: [name] [name] in Treatment: 21 Active Problems ICD-10 Encounter Code Description Active Date MDM Diagnosis L97.512 Non-pressure chronic ulcer of other part of right foot with  fat layer exposed 01/28/2022 No Yes G90.09 Other idiopathic peripheral autonomic neuropathy 01/28/2022 No Yes M86.9 Osteomyelitis, unspecified 01/28/2022 No Yes I48.91 Unspecified atrial fibrillation 01/28/2022 No Yes Z79.01 Long term (current) use of anticoagulants 01/28/2022 No Yes I89.0 Lymphedema, not elsewhere classified 01/28/2022 No Yes I87.2 Venous insufficiency (chronic) (peripheral) 01/28/2022 No Yes J44.9 Chronic obstructive pulmonary disease, unspecified 01/28/2022 No Yes I73.9 Peripheral vascular disease, unspecified 03/27/2022 No Yes S81.801A Unspecified open wound, right lower leg, initial encounter 06/17/2022 No Yes Inactive Problems ICD-10 Code Description Active Date Inactive Date L97.812 Non-pressure chronic ulcer of other part of right lower leg with fat layer exposed 01/28/2022 01/28/2022 Resolved Problems ICD-10 Code Description Active Date Resolved Date Modena MorrowHILL, Luverta M ([id number]) [id number] Page 5 of 9 [phone] Non-pressure chronic ulcer of other part of left lower leg with fat layer exposed 01/28/2022 01/28/2022 Electronic Signature(s) Signed: 06/26/2022 4:16:28 PM By: Geralyn CorwinHoffman, Deisi Salonga DO Entered By: Geralyn CorwinHoffman, Viraat Vanpatten on 06/26/2022 14:11:39 -------------------------------------------------------------------------------- Progress Note Details Patient Name: Date of Service: [name], [name] TTIE M. 06/26/2022 1:30 PM Medical Record Number: [id number] Patient Account Number: [id number] Date of Birth/Sex: Treating RN: 08/26/1928 (88 y.o. F) Primary Care Provider:  Jorge NyHayes, Romana Other Clinician: Referring Provider: Treating Provider/Extender: [name] LarssonHoffman, Kasiyah Platter Hayes, Romana [name] in Treatment: 21 Subjective Chief Complaint Information obtained from Patient 01/28/2022; right second toe amputation site dehiscence and bilateral lower extremity wounds. History of Present Illness (HPI) Admission 01/28/2022 Ms. [name] HighlandHattie [name] is a [age] year old female with a past medical history of idiopathic peripheral neuropathy status post amputation to the second right toe secondary to osteomyelitis, COPD and A-fib on Coumadin the presents to the clinic for a 8034-month history of nonhealing ulcer to a previous amputation site on the second right toe. She states she has tried Medihoney and silver alginate in the past to this area with little benefit. She also has 2 small areas limited to skin breakdown to her lower extremities bilaterally. She has chronic venous insufficiency but not has not been wearing her compression stockings. She states she bumped her legs against an object and not so the wound started. She has been using Medihoney to the sites. She denies signs of infection. 6/2; patient presents for follow-up. She had an x-ray of her right foot done at last clinic visit and this was negative for evidence of osteomyelitis. She also had a wound culture done that showed extra high levels of Staph aureus. I recommended Keystone antibiotics for this and this was ordered. She had ABIs with TBI's done as well that showed monophasic waveforms to the right foot with TBI of 0 and an ABI of 0.52. Urgent referral was made to vein and vascular and she saw Dr. Durwin Noraixon on 6/1, yesterday and he recommended an arteriogram. This is scheduled for 6/16. Patient also reports a new wound to the right great toe. This is a blister that has ruptured. She also reports increased erythema to the toe. 6/6; the patient was worked in urgently today at the insistence of her daughter out of concern for a new  wound on the lateral part of the plantar right great toe. She has her original postsurgical wound after the amputation of the right second toe, she has a wound on the medial part of the right great toe. The patient is apparently going for an angiogram by Dr. Durwin Noraixon in 2 [name] time. 6/13; patient presents for follow-up. She has been using bacitracin to the abrasion on the right great toe. She has been using collagen and Keystone antibiotic to the amputation  site. She has no issues or complaints today. She denies signs of infection. 6/22; patient presents for follow-up. She states that her abdominal aortogram was canceled due to her renal function. She has been using Keystone antibiotics to the amputation site and Medihoney to the right great toe wound. At the pace of the right great toe she has a slitlike open area that she thinks was caused by the tape from the dressing. 6/29; patient presents for follow-up. She has been using Keystone antibiotics to the amputation site along with collagen. She has been using Medihoney to the right great toe wound. She has no other wounds. She denies signs of infection. 7/13; patient presents for follow-up. She has been using Keystone antibiotics and collagen to the wound sites. She currently denies signs of infection. She states she is scheduled to see me nephrology next month. 7/20; patient presents for follow-up. She continue Keystone antibiotics and collagen to the wound sites. She has no issues or complaints today. 8/1; patient presents for follow up. She continues to use keystone antibiotics and collagen to the wound sites. She has no issues or complaints today. 8/15; patient presents for follow-up. She has been using Keystone antibiotics and collagen to the wound sites. She followed up with her nephrologist who made medication changes. She is supposed to get a repeat BMP in 2 [name]. Her decrease in renal function was a limiting factor in obtaining an arteriogram  for potential intervention for revascularization. She currently denies signs of infection. 9/2; patient presents for follow-up. She has been using Keystone antibiotic and collagen to the wound beds. She has no issues or complaints today. Reports there has been improvement in kidney function however not cleared to have her arteriogram just yet. 10/10; patient presents for follow-up. She has been using Keystone antibiotic and collagen to the wound beds. She reports 2 new wounds 1 to the anterior right lower extremity and another to the plantar aspect of the right foot. She states that the right plantar foot wound was caused by the home health nurse changing the dressing and causing a skin tear. She is not sure how the right anterior leg wound started. It appears to be from trauma. She denies signs of infection. 10/19; patient presents for follow-up. She has been using Keystone antibiotic and collagen to the right great toe wound. She is been using silver alginate to the right anterior and right plantar foot wound. She has been using Tubigrip to the right lower extremity. The plantar foot wound has healed. She has no issues or complaints today. Patient History CARNESHIA, RAKER ([id number]) [id number] Page 6 of 9 Information obtained from Patient, Caregiver. Family History Cancer - Siblings, Heart Disease, Stroke - Mother, No family history of Diabetes. Social History Former smoker - quit 2003, Marital Status - Widowed, Alcohol Use - Never, Drug Use - No History, Caffeine Use - Never. Medical History Eyes Patient has history of Cataracts Hematologic/Lymphatic Patient has history of Anemia Respiratory Denies history of Chronic Obstructive Pulmonary Disease (COPD) Cardiovascular Patient has history of Congestive Heart Failure, Hypotension, Peripheral Venous Disease Musculoskeletal Patient has history of Osteomyelitis - right foot second toe  amputated Neurologic Patient has history of Neuropathy Medical A Surgical History Notes nd Hematologic/Lymphatic Thrombocytopenia Endocrine Hyperthyroidism, Hypothyroidism Objective Constitutional respirations regular, non-labored and within target range for patient.. Vitals Time Taken: 1:40 PM, Height: 62 in, Weight: 181 lbs, BMI: 33.1, Temperature: 97.9 F, Pulse: 61 bpm, Respiratory Rate: 20 breaths/min, Blood Pressure: 122/49 mmHg. Cardiovascular 2+ dorsalis  pedis/posterior tibialis pulses. Psychiatric pleasant and cooperative. General Notes: Right lower extremity: T the lateral aspect of the great toe there is an open wound with pale granulation tissue. T the anterior aspect there is o o an open wound with granulation tissue and tightly adhered nonviable tissue. T the plantar foot wound there is epithelialization to the previous wound site. o Integumentary (Hair, Skin) Wound #5 status is Open. Original cause of wound was Shear/Friction. The date acquired was: 02/06/2022. The wound has been in treatment 19 [name]. The wound is located on the Right T Great. The wound measures 0.3cm length x 0.3cm width x 0.2cm depth; 0.071cm^2 area and 0.014cm^3 volume. There is Fat oe Layer (Subcutaneous Tissue) exposed. There is no tunneling or undermining noted. There is a none present amount of drainage noted. The wound margin is distinct with the outline attached to the wound base. There is no granulation within the wound bed. There is a large (67-100%) amount of necrotic tissue within the wound bed including Eschar. The periwound skin appearance exhibited: Callus, Dry/Scaly. The periwound skin appearance did not exhibit: Crepitus, Excoriation, Induration, Rash, Scarring, Maceration, Atrophie Blanche, Cyanosis, Ecchymosis, Hemosiderin Staining, Mottled, Pallor, Rubor, Erythema. Wound #6 status is Open. Original cause of wound was Bump. The date acquired was: 06/16/2022. The wound has been in  treatment 1 [name]. The wound is located on the Right,Anterior Lower Leg. The wound measures 0.5cm length x 0.8cm width x 0.1cm depth; 0.314cm^2 area and 0.031cm^3 volume. There is Fat Layer (Subcutaneous Tissue) exposed. There is no tunneling or undermining noted. There is a medium amount of serosanguineous drainage noted. The wound margin is distinct with the outline attached to the wound base. There is medium (34-66%) red granulation within the wound bed. There is a medium (34-66%) amount of necrotic tissue within the wound bed including Adherent Slough. The periwound skin appearance did not exhibit: Callus, Crepitus, Excoriation, Induration, Rash, Scarring, Dry/Scaly, Maceration, Atrophie Blanche, Cyanosis, Ecchymosis, Hemosiderin Staining, Mottled, Pallor, Rubor, Erythema. Wound #7 status is Healed - Epithelialized. Original cause of wound was Skin T ear/Laceration. The date acquired was: 06/13/2022. The wound has been in treatment 1 [name]. The wound is located on the Right Metatarsal head first. The wound measures 0cm length x 0cm width x 0cm depth; 0cm^2 area and 0cm^3 volume. There is no tunneling or undermining noted. There is a none present amount of drainage noted. The wound margin is distinct with the outline attached to the wound base. There is no granulation within the wound bed. There is no necrotic tissue within the wound bed. The periwound skin appearance did not exhibit: Callus, Crepitus, Excoriation, Induration, Rash, Scarring, Dry/Scaly, Maceration, Atrophie Blanche, Cyanosis, Ecchymosis, Hemosiderin Staining, Mottled, Pallor, Rubor, Erythema. 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 idiopathic peripheral autonomic neuropathy [name], [name] M ([id number]) [id number] Page 7 of 9 Osteomyelitis, unspecified Unspecified atrial fibrillation Long term (current) use of  anticoagulants Lymphedema, not elsewhere classified Venous insufficiency (chronic) (peripheral) Chronic obstructive pulmonary disease, unspecified Peripheral vascular disease, unspecified Unspecified open wound, right lower leg, initial encounter Her wounds appear well-healing. The right plantar foot wound has healed. At this time I recommended Medihoney and silver alginate to all the wound beds. Continue Tubigrip daily. Follow-up in 3 [name]. Plan Follow-up Appointments: Return appointment in 3 [name]. - with Dr. Heber [place]. [place] and Tammi Klippel Anesthetic: (In clinic) Topical Lidocaine 5% applied to wound bed Edema Control - Lymphedema / SCD / Other: Elevate legs  to the level of the heart or above for 30 minutes daily and/or when sitting, a frequency of: - throughout the day. Avoid standing for long periods of time. Off-Loading: Open toe surgical shoe to: - while sitting and standing. Home Health: New wound care orders this week; continue Home Health for wound care. May utilize formulary equivalent dressing for wound treatment orders unless otherwise specified. - Home health weekly dressing changes. Medihoney to all wounds. may apply medihoney to right 5th toe abrasion. Other Home Health Orders/Instructions: - [facility]. WOUND #5: - T Great Wound Laterality: Right oe Cleanser: Soap and Water (Home Health) 1 x Per Day/30 Days Discharge Instructions: May shower and wash wound with dial antibacterial soap and water prior to dressing change. Cleanser: Wound Cleanser (Home Health) 1 x Per Day/30 Days Discharge Instructions: Cleanse the wound with wound cleanser prior to applying a clean dressing using gauze sponges, not tissue or cotton balls. Prim Dressing: MediHoney Gel, tube 1.5 (oz) (Home Health) 1 x Per Day/30 Days ary Discharge Instructions: Apply to wound bed as instructed Secondary Dressing: Woven Gauze Sponges 2x2 in (Home Health) 1 x Per Day/30 Days Discharge Instructions: Apply over  primary dressing. may tape down with medipore tape. Secured With: Insurance underwriter, Sterile 2x75 (in/in) (Home Health) 1 x Per Day/30 Days Discharge Instructions: Secure with stretch gauze or use paper tape to secure the gauze. Secured With: Paper T ape, 2x10 (in/yd) (Home Health) 1 x Per Day/30 Days Discharge Instructions: Secure dressing with tape as directed. WOUND #6: - Lower Leg Wound Laterality: Right, Anterior Cleanser: Wound Cleanser (Home Health) 1 x Per Day/30 Days Discharge Instructions: Cleanse the wound with wound cleanser prior to applying a clean dressing using gauze sponges, not tissue or cotton balls. Prim Dressing: KerraCel Ag Gelling Fiber Dressing, 2x2 in (silver alginate) (Home Health) 1 x Per Day/30 Days ary Discharge Instructions: Apply silver alginate to wound bed as instructed Prim Dressing: MediHoney Gel, tube 1.5 (oz) (Home Health) 1 x Per Day/30 Days ary Discharge Instructions: Apply to wound bed as instructed Secondary Dressing: Zetuvit Plus Silicone Border Dressing 4x4 (in/in) (Home Health) 1 x Per Day/30 Days Discharge Instructions: Apply silicone border over primary dressing as directed. Com pression Wrap: Tubigrip size E 1 x Per Day/30 Days Discharge Instructions: one layer only. 1. Silver alginate and Medihoney to all the wound beds 2. Follow-up in 3 [name] 3. Tubigrip daily to the right lower extremity Electronic Signature(s) Signed: 06/26/2022 4:16:28 PM By: Geralyn Corwin DO Entered By: Geralyn Corwin on 06/26/2022 14:20:35 -------------------------------------------------------------------------------- HxROS Details Patient Name: Date of Service: Waggoner, [name] TTIE M. 06/26/2022 1:30 PM Medical Record Number: [id number] Patient Account Number: [id number] Date of Birth/Sex: Treating RN: [birth year] (88 y.o. F) Primary Care Provider: Jorge [name] Other Clinician: Referring Provider: Treating Provider/Extender: [name] [name] in Treatment: [address] [name] ([id number]) [id number] Page 8 of 9 Information Obtained From Patient Caregiver Eyes Medical History: Positive for: Cataracts Hematologic/Lymphatic Medical History: Positive for: Anemia Past Medical History Notes: Thrombocytopenia Respiratory Medical History: Negative for: Chronic Obstructive Pulmonary Disease (COPD) Cardiovascular Medical History: Positive for: Congestive Heart Failure; Hypotension; Peripheral Venous Disease Endocrine Medical History: Past Medical History Notes: Hyperthyroidism, Hypothyroidism Musculoskeletal Medical History: Positive for: 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 Neurologic Medical History: Positive for: Neuropathy HBO Extended History Items Eyes: Cataracts Immunizations Pneumococcal Vaccine: Received Pneumococcal Vaccination: No Implantable Devices None Family and Social History Cancer: Yes - Siblings; Diabetes: No; Heart Disease: Yes; Stroke:  Yes - Mother; Former smoker - quit 2003; Marital Status - Widowed; Alcohol Use: Never; Drug Use: No History; Caffeine Use: Never; Financial Concerns: No; Food, Clothing or Shelter Needs: No; Support System Lacking: No; Transportation Concerns: No Electronic Signature(s) Signed: 06/26/2022 4:16:28 PM By: Geralyn Corwin DO Entered By: Geralyn Corwin on 06/26/2022 14:12:47 -------------------------------------------------------------------------------- SuperBill Details Patient Name: Date of Service: [name] 06/26/2022 Medical Record Number: [id number] Patient Account Number: [id number] Date of Birth/Sex: Treating RN: 04-Jul-1928 (88 y.o. [name] [place], [place] ([id number]) [id number] Page 9 of 9 Primary Care Provider: Jorge [name] Other Clinician: Referring Provider: Treating Provider/Extender: [name] [name] in Treatment: 21 Diagnosis  Coding ICD-10 Codes Code Description [phone] Non-pressure chronic ulcer of other part of right foot with fat layer exposed G90.09 Other idiopathic peripheral autonomic neuropathy M86.9 Osteomyelitis, unspecified I48.91 Unspecified atrial fibrillation Z79.01 Long term (current) use of anticoagulants I89.0 Lymphedema, not elsewhere classified I87.2 Venous insufficiency (chronic) (peripheral) J44.9 Chronic obstructive pulmonary disease, unspecified I73.9 Peripheral vascular disease, unspecified S81.801A Unspecified open wound, right lower leg, initial encounter Facility Procedures : CPT4 Code: [id number] Description: 56433 - WOUND CARE VISIT-LEV 5 EST PT Modifier: Quantity: 1 Physician Procedures : CPT4 Code Description Modifier [id number] - WC PHYS LEVEL 3 - EST PT ICD-10 Diagnosis Description L97.512 Non-pressure chronic ulcer of other part of right foot with fat layer exposed I73.9 Peripheral vascular disease, unspecified S81.801A  Unspecified open wound, right lower leg, initial encounter I87.2 Venous insufficiency (chronic) (peripheral) Quantity: 1 Electronic Signature(s) Signed: 06/26/2022 4:16:28 PM By: Geralyn Corwin DO Entered By: Geralyn Corwin on 06/26/2022 14:21:23

## 2022-06-27 NOTE — Progress Notes (Signed)
EVELYNA, FOLKER ([id number]) [id number] Page 1 of 12 Visit Report for 06/26/2022 Arrival Information Details Patient Name: Date of Service: [name], [name]. 06/26/2022 1:30 PM Medical Record Number: [id number] Patient Account Number: [id number] Date of Birth/Sex: Treating RN: [birth year] (88 y.o. [name], [name] Primary Care Sherron Mapp: [name] Other Clinician: Referring Sterling Mondo: Treating Azhane Eckart/Extender: Noreene Larsson Weeks in Treatment: 21 Visit Information History Since Last Visit Added or deleted any medications: No Patient Arrived: Wheel Chair Any new allergies or adverse reactions: No Arrival Time: 13:35 Had a fall or experienced change in No Accompanied By: self activities of daily living that may affect Transfer Assistance: Manual risk of falls: Patient Identification Verified: Yes Signs or symptoms of abuse/neglect since No Secondary Verification Process Completed: Yes last visito Patient Requires Transmission-Based Precautions: No Hospitalized since last visit: No Patient Has Alerts: Yes Implantable device outside of the clinic No Patient Alerts: Patient on Blood Thinner excluding cellular tissue based products placed in the center since last visit: Has Dressing in Place as Prescribed: Yes Has Footwear/Offloading in Place as Yes Prescribed: Right: Surgical Shoe with Pressure Relief Insole Pain Present Now: No Electronic Signature(s) Signed: 06/26/2022 6:08:05 PM By: Shawn Stall RN, BSN Entered By: Shawn Stall on 06/26/2022 13:48:22 -------------------------------------------------------------------------------- Clinic Level of Care Assessment Details Patient Name: Date of Service: [name], Megan Catholic. 06/26/2022 1:30 PM Medical Record Number: [id number] Patient Account Number: [id number] Date of Birth/Sex: Treating RN: 12-08-1927 (88 y.o. [name] Primary Care Albana Saperstein: [name] Other  Clinician: Referring Serai Tukes: Treating Kailie Polus/Extender: Noreene Larsson Weeks in Treatment: 21 Clinic Level of Care Assessment Items TOOL 4 Quantity Score X- 1 0 Use when only an EandM is performed on FOLLOW-UP visit ASSESSMENTS - Nursing Assessment / Reassessment X- 1 10 Reassessment of Co-morbidities (includes updates in patient status) X- 1 5 Reassessment of Adherence to Treatment Plan ASSESSMENTS - Wound and Skin A ssessment / Reassessment []  - 0 Simple Wound Assessment / Reassessment - one wound X- 3 5 Complex Wound Assessment / Reassessment - multiple wounds X- 1 10 Dermatologic / Skin Assessment (not related to wound area) [name], [name] ( ) [id number] Page 2 of 12 ASSESSMENTS - Focused Assessment X- 2 5 Circumferential Edema Measurements - multi extremities []  - 0 Nutritional Assessment / Counseling / Intervention []  - 0 Lower Extremity Assessment (monofilament, tuning fork, pulses) []  - 0 Peripheral Arterial Disease Assessment (using hand held doppler) ASSESSMENTS - Ostomy and/or Continence Assessment and Care []  - 0 Incontinence Assessment and Management []  - 0 Ostomy Care Assessment and Management (repouching, etc.) PROCESS - Coordination of Care []  - 0 Simple Patient / Family Education for ongoing care X- 1 20 Complex (extensive) Patient / Family Education for ongoing care X- 1 10 Staff obtains 01-14-1979, Records, T Results / Process Orders est X- 1 10 Staff telephones HHA, Nursing Homes / Clarify orders / etc []  - 0 Routine Transfer to another Facility (non-emergent condition) []  - 0 Routine Hospital Admission (non-emergent condition) []  - 0 New Admissions / / Ordering NPWT Apligraf, etc. , []  - 0 Emergency Hospital Admission (emergent condition) []  - 0 Simple Discharge Coordination X- 1 15 Complex (extensive) Discharge Coordination PROCESS - Special Needs []  -  0 Pediatric / Minor Patient Management []  - 0 Isolation Patient Management []  - 0 Hearing / Language / Visual special needs []  - 0 Assessment of Community assistance (transportation, D/C planning, etc.) []  - 0 Additional assistance / Altered mentation []  -  0 Support Surface(s) Assessment (bed, cushion, seat, etc.) INTERVENTIONS - Wound Cleansing / Measurement []  - 0 Simple Wound Cleansing - one wound X- 3 5 Complex Wound Cleansing - multiple wounds X- 1 5 Wound Imaging (photographs - any number of wounds) []  - 0 Wound Tracing (instead of photographs) []  - 0 Simple Wound Measurement - one wound X- 3 5 Complex Wound Measurement - multiple wounds INTERVENTIONS - Wound Dressings X - Small Wound Dressing one or multiple wounds 3 10 []  - 0 Medium Wound Dressing one or multiple wounds []  - 0 Large Wound Dressing one or multiple wounds []  - 0 Application of Medications - topical []  - 0 Application of Medications - injection INTERVENTIONS - Miscellaneous []  - 0 External ear exam []  - 0 Specimen Collection (cultures, biopsies, blood, body fluids, etc.) []  - 0 Specimen(s) / Culture(s) sent or taken to Lab for analysis []  - 0 Patient Transfer (multiple staff / Michiel SitesHoyer Lift / Similar devices) []  - 0 Simple Staple / Suture removal (25 or less) [name], [name] ([id number]) [id number] Page 3 of 12 []  - 0 Complex Staple / Suture removal (26 or more) []  - 0 Hypo / Hyperglycemic Management (close monitor of Blood Glucose) []  - 0 Ankle / Brachial Index (ABI) - do not check if billed separately X- 1 5 Vital Signs Has the patient been seen at the hospital within the last three years: Yes Total Score: 175 Level Of Care: New/Established - Level 5 Electronic Signature(s) Signed: 06/26/2022 6:08:05 PM By: Shawn Stalleaton, Bobbi RN, BSN Entered By: Shawn Stalleaton, Bobbi on 06/26/2022  14:07:46 -------------------------------------------------------------------------------- Encounter Discharge Information Details Patient Name: Date of Service: Megan ReapHILL, Megan TTIE [name]. 06/26/2022 1:30 PM Medical Record Number: [id number] Patient Account Number: [id number] Date of Birth/Sex: Treating RN: 05/09/1928 (88 y.o. [name]) Deaton, Bobbi Primary Care Morgana Rowley: Jorge NyHayes, Romana Other Clinician: Referring Alaa Eyerman: Treating Jeanette Rauth/Extender: Noreene LarssonHoffman, Jessica Hayes, Romana Weeks in Treatment: 21 Encounter Discharge Information Items Discharge Condition: Stable Ambulatory Status: Wheelchair Discharge Destination: Home Transportation: Private Auto Accompanied By: daughter Schedule Follow-up Appointment: Yes Clinical Summary of Care: Electronic Signature(s) Signed: 06/26/2022 6:08:05 PM By: Shawn Stalleaton, Bobbi RN, BSN Entered By: Shawn Stalleaton, Bobbi on 06/26/2022 14:08:11 -------------------------------------------------------------------------------- Lower Extremity Assessment Details Patient Name: Date of Service: Megan ReapHILL, Megan TTIE [name]. 06/26/2022 1:30 PM Medical Record Number: [id number] Patient Account Number: [id number] Date of Birth/Sex: Treating RN: 06/26/1928 (88 y.o. [name]) Deaton, Bobbi Primary Care Takeo Harts: Jorge NyHayes, Romana Other Clinician: Referring Zannie Locastro: Treating Pharrell Ledford/Extender: Noreene LarssonHoffman, Jessica Hayes, Romana Weeks in Treatment: 21 Edema Assessment Assessed: [Left: No] [Right: Yes] Edema: [Left: Ye] [Right: s] Calf Left: Right: Point of Measurement: 30 cm From Medial Instep 45.5 cm Ankle Left: Right: Modena MorrowHILL, Rilya [name] ([id number]) [id number] Page 4 of 12 Point of Measurement: 9 cm From Medial Instep 24 cm Vascular Assessment Pulses: Dorsalis Pedis Palpable: [Right:No] Electronic Signature(s) Signed: 06/26/2022 6:08:05 PM By: Shawn Stalleaton, Bobbi RN, BSN Entered By: Shawn Stalleaton, Bobbi on 06/26/2022  13:49:32 -------------------------------------------------------------------------------- Multi Wound Chart Details Patient Name: Date of Service: Megan ReapHILL, Megan TTIE [name]. 06/26/2022 1:30 PM Medical Record Number: [id number] Patient Account Number: [id number] Date of Birth/Sex: Treating RN: 07/03/1928 (88 y.o. F) Primary Care Adonai Selsor: Jorge NyHayes, Romana Other Clinician: Referring Lenix Benoist: Treating Quenna Doepke/Extender: Noreene LarssonHoffman, Jessica Hayes, Romana Weeks in Treatment: 21 Vital Signs Height(in): 62 Pulse(bpm): 61 Weight(lbs): 181 Blood Pressure(mmHg): 122/49 Body Mass Index(BMI): 33.1 Temperature(F): 97.9 Respiratory Rate(breaths/min): 20 [5:Photos:] Right T Great oe Right, Anterior Lower Leg Right Metatarsal head first Wound Location: Shear/Friction Bump Skin Tear/Laceration Wounding Event: Abrasion Skin Tear Skin Tear Primary  Etiology: Cataracts, Anemia, Congestive Heart Cataracts, Anemia, Congestive Heart Cataracts, Anemia, Congestive Heart Comorbid History: Failure, Hypotension, Peripheral Failure, Hypotension, Peripheral Failure, Hypotension, Peripheral Venous Disease, Osteomyelitis, Venous Disease, Osteomyelitis, Venous Disease, Osteomyelitis, Neuropathy Neuropathy Neuropathy 02/06/2022 06/16/2022 06/13/2022 Date Acquired: Weeks of Treatment: Open Open Healed - Epithelialized Wound Status: No No No Wound Recurrence: 0.3x0.3x0.2 0.5x0.8x0.1 0x0x0 Measurements L x W x D (cm) 0.071 0.314 0 A (cm) : rea 0.014 0.031 0 Volume (cm) : 81.60% 20.10% 100.00% % Reduction in Area: 63.20% 20.50% 100.00% % Reduction in Volume: Full Thickness Without Exposed Full Thickness Without Exposed Full Thickness Without Exposed Classification: Support Structures Support Structures Support Structures None Present Medium None Present Exudate A mount: N/A Serosanguineous N/A Exudate Type: N/A red, brown N/A Exudate Color: Distinct, outline attached Distinct, outline attached Distinct,  outline attached Wound Margin: None Present (0%) Medium (34-66%) None Present (0%) Granulation Amount: N/A Red N/A Granulation Quality: Large (67-100%) Medium (34-66%) None Present (0%) Necrotic Amount: Eschar Adherent Slough N/A Necrotic Tissue: Fat Layer (Subcutaneous Tissue): Yes Fat Layer (Subcutaneous Tissue): Yes Fascia: No Exposed Structures: Fascia: No Fascia: No Fat Layer (Subcutaneous Tissue): No Tendon: No Tendon: No Tendon: No Muscle: No Muscle: No Muscle: No Thurston, Natelie [name] ([id number]) [id number] Page 5 of 12 Joint: No Joint: No Joint: No Bone: No Bone: No Bone: No Large (67-100%) None Large (67-100%) Epithelialization: Callus: Yes Excoriation: No Excoriation: No Periwound Skin Texture: Excoriation: No Induration: No Induration: No Induration: No Callus: No Callus: No Crepitus: No Crepitus: No Crepitus: No Rash: No Rash: No Rash: No Scarring: No Scarring: No Scarring: No Dry/Scaly: Yes Maceration: No Maceration: No Periwound Skin Moisture: Maceration: No Dry/Scaly: No Dry/Scaly: No Atrophie Blanche: No Atrophie Blanche: No Atrophie Blanche: No Periwound Skin Color: Cyanosis: No Cyanosis: No Cyanosis: No Ecchymosis: No Ecchymosis: No Ecchymosis: No Erythema: No Erythema: No Erythema: No Hemosiderin Staining: No Hemosiderin Staining: No Hemosiderin Staining: No Mottled: No Mottled: No Mottled: No Pallor: No Pallor: No Pallor: No Rubor: No Rubor: No Rubor: No Treatment Notes Wound #5 (Toe Great) Wound Laterality: Right Cleanser Soap and Water Discharge Instruction: May shower and wash wound with dial antibacterial soap and water prior to dressing change. Wound Cleanser Discharge Instruction: Cleanse the wound with wound cleanser prior to applying a clean dressing using gauze sponges, not tissue or cotton balls. Peri-Wound Care Topical Primary Dressing MediHoney Gel, tube 1.5 (oz) Discharge  Instruction: Apply to wound bed as instructed Secondary Dressing Woven Gauze Sponges 2x2 in Discharge Instruction: Apply over primary dressing. may tape down with medipore tape. Secured With Conforming Stretch Gauze Bandage, Sterile 2x75 (in/in) Discharge Instruction: Secure with stretch gauze or use paper tape to secure the gauze. Paper Tape, 2x10 (in/yd) Discharge Instruction: Secure dressing with tape as directed. Compression Wrap Compression Stockings Add-Ons Wound #6 (Lower Leg) Wound Laterality: Right, Anterior Cleanser Wound Cleanser Discharge Instruction: Cleanse the wound with wound cleanser prior to applying a clean dressing using gauze sponges, not tissue or cotton balls. Peri-Wound Care Topical Primary Dressing KerraCel Ag Gelling Fiber Dressing, 2x2 in (silver alginate) Discharge Instruction: Apply silver alginate to wound bed as instructed MediHoney Gel, tube 1.5 (oz) Discharge Instruction: Apply to wound bed as instructed Secondary Dressing Zetuvit Plus Silicone Border Dressing 4x4 (in/in) Discharge Instruction: Apply silicone border over primary dressing as directed. Secured With Compression Wrap Tubigrip size E Discharge Instruction: one layer only. [name], [name] ([id number]) [id number] Page 6 of 12 Compression Stockings Add-Ons Electronic Signature(s) Signed: 06/26/2022 4:16:28 PM  By: Kalman Shan DO Entered By: Kalman Shan on 06/26/2022 14:11:46 -------------------------------------------------------------------------------- Multi-Disciplinary Care Plan Details Patient Name: Date of Service: Megan Bogus. 06/26/2022 1:30 PM Medical Record Number: [id number] Patient Account Number: [id number] Date of Birth/Sex: Treating RN: 05-10-1928 (88 y.o. [name], Tammi Klippel Primary Care [place] Lapaglia: Windy Carina Other Clinician: Referring Jennifier Smitherman: Treating Juanpablo Ciresi/Extender: Veneda Melter Weeks in Treatment:  21 Active Inactive Nutrition Nursing Diagnoses: Potential for alteratiion in Nutrition/Potential for imbalanced nutrition Goals: Patient/caregiver agrees to and verbalizes understanding of need to use nutritional supplements and/or vitamins as prescribed Date Initiated: 01/28/2022 Target Resolution Date: 08/08/2022 Goal Status: Active Interventions: Assess patient nutrition upon admission and as needed per policy Provide education on nutrition Treatment Activities: Education provided on Nutrition : 04/22/2022 Patient referred to Primary Care Physician for further nutritional evaluation : 01/28/2022 Notes: Wound/Skin Impairment Nursing Diagnoses: Knowledge deficit related to ulceration/compromised skin integrity Goals: Patient/caregiver will verbalize understanding of skin care regimen Date Initiated: 01/28/2022 Target Resolution Date: 08/07/2022 Goal Status: Active Interventions: Assess patient/caregiver ability to perform ulcer/skin care regimen upon admission and as needed Assess ulceration(s) every visit Provide education on ulcer and skin care Treatment Activities: Skin care regimen initiated : 01/28/2022 Topical wound management initiated : 01/28/2022 Notes: Electronic Signature(s) Signed: 06/26/2022 6:08:05 PM By: Deon Pilling RN, BSN Entered By: Deon Pilling on 06/26/2022 13:53:08 [name], [name] ([id number]) [id number] Page 7 of 12 -------------------------------------------------------------------------------- Pain Assessment Details Patient Name: Date of Service: [name], [name] 06/26/2022 1:30 PM Medical Record Number: [id number] Patient Account Number: [id number] Date of Birth/Sex: Treating RN: 1927/10/07 (88 y.o. [name] Primary Care Awab Abebe: Windy Carina Other Clinician: Referring Even Budlong: Treating Wauneta Silveria/Extender: Veneda Melter Weeks in Treatment: 21 Active Problems Location of Pain Severity and  Description of Pain Patient Has Paino No Site Locations Rate the pain. Current Pain Level: 0 Pain Management and Medication Current Pain Management: Medication: No Cold Application: No Rest: No Massage: No Activity: No T.E.N.S.: No Heat Application: No Leg drop or elevation: No Is the Current Pain Management Adequate: Adequate How does your wound impact your activities of daily livingo Sleep: No Bathing: No Appetite: No Relationship With Others: No Bladder Continence: No Emotions: No Bowel Continence: No Work: No Toileting: No Drive: No Dressing: No Hobbies: No Engineer, maintenance) Signed: 06/26/2022 6:08:05 PM By: Deon Pilling RN, BSN Entered By: Deon Pilling on 06/26/2022 13:48:40 -------------------------------------------------------------------------------- Patient/Caregiver Education Details Patient Name: Date of Service: Megan Bogus 10/19/2023andnbsp1:30 PM Medical Record Number: [id number] Patient Account Number: [id number] Date of Birth/Gender: Treating RN: 01-30-1928 (88 y.o. [name] Primary Care Physician: Windy Carina Other Clinician: WESLYN, [name] ([id number]) [id number] Page 8 of 12 Referring Physician: Treating Physician/Extender: Memory Dance in Treatment: 21 Education Assessment Education Provided To: Patient and Caregiver Education Topics Provided Wound/Skin Impairment: Handouts: Skin Care Do's and Dont's Methods: Explain/Verbal Responses: Reinforcements needed Electronic Signature(s) Signed: 06/26/2022 6:08:05 PM By: Deon Pilling RN, BSN Entered By: Deon Pilling on 06/26/2022 13:53:20 -------------------------------------------------------------------------------- Wound Assessment Details Patient Name: Date of Service: Megan Bogus. 06/26/2022 1:30 PM Medical Record Number: [id number] Patient Account Number: [id number] Date of Birth/Sex: Treating RN: 1928/02/24 (88  y.o. [name] Primary Care Dalaysia Harms: Windy Carina Other Clinician: Referring Lyndsi Altic: Treating Rodrecus Belsky/Extender: Veneda Melter Weeks in Treatment: 21 Wound Status Wound Number: 5 Primary Abrasion Etiology: Wound Location: Right T Great oe Wound Open Wounding Event: Shear/Friction Status: Date Acquired: 02/06/2022 Comorbid Cataracts, Anemia, Congestive Heart Failure, Hypotension, Weeks Of  Treatment: 19 History: Peripheral Venous Disease, Osteomyelitis, Neuropathy Clustered Wound: No Photos Wound Measurements Length: (cm) 0.3 Width: (cm) 0.3 Depth: (cm) 0.2 Area: (cm) 0.071 Volume: (cm) 0.014 % Reduction in Area: 81.6% % Reduction in Volume: 63.2% Epithelialization: Large (67-100%) Tunneling: No Undermining: No Wound Description Classification: Full Thickness Without Exposed Suppo Wound Margin: Distinct, outline attached Exudate Amount: None Present rt Structures Foul Odor After Cleansing: No Slough/Fibrino No Wound Bed Harig, Aalaya [name] ([id number]) [id number] Page 9 of 12 Granulation Amount: None Present (0%) Exposed Structure Necrotic Amount: Large (67-100%) Fascia Exposed: No Necrotic Quality: Eschar Fat Layer (Subcutaneous Tissue) Exposed: Yes Tendon Exposed: No Muscle Exposed: No Joint Exposed: No Bone Exposed: No Periwound Skin Texture Texture Color No Abnormalities Noted: No No Abnormalities Noted: No Callus: Yes Atrophie Blanche: No Crepitus: No Cyanosis: No Excoriation: No Ecchymosis: No Induration: No Erythema: No Rash: No Hemosiderin Staining: No Scarring: No Mottled: No Pallor: No Moisture Rubor: No No Abnormalities Noted: No Dry / Scaly: Yes Maceration: No Treatment Notes Wound #5 (Toe Great) Wound Laterality: Right Cleanser Soap and Water Discharge Instruction: May shower and wash wound with dial antibacterial soap and water prior to dressing change. Wound Cleanser Discharge  Instruction: Cleanse the wound with wound cleanser prior to applying a clean dressing using gauze sponges, not tissue or cotton balls. Peri-Wound Care Topical Primary Dressing MediHoney Gel, tube 1.5 (oz) Discharge Instruction: Apply to wound bed as instructed Secondary Dressing Woven Gauze Sponges 2x2 in Discharge Instruction: Apply over primary dressing. may tape down with medipore tape. Secured With Conforming Stretch Gauze Bandage, Sterile 2x75 (in/in) Discharge Instruction: Secure with stretch gauze or use paper tape to secure the gauze. Paper Tape, 2x10 (in/yd) Discharge Instruction: Secure dressing with tape as directed. Compression Wrap Compression Stockings Add-Ons Electronic Signature(s) Signed: 06/26/2022 4:05:37 PM By: Fonnie Mu RN Signed: 06/26/2022 6:08:05 PM By: Shawn Stall RN, BSN Entered By: Fonnie Mu on 06/26/2022 13:50:56 -------------------------------------------------------------------------------- Wound Assessment Details Patient Name: Date of Service: [name]. 06/26/2022 1:30 PM Medical Record Number: [id number] Patient Account Number: [id number] Date of Birth/Sex: Treating RN: 1928-02-13 (88 y.o. [name] Primary Care Cloma Rahrig: [name] Other Clinician: ARNETTE, [name] ([id number]) [id number] Page 10 of 12 Referring Aletha Allebach: Treating Erandy Mceachern/Extender: Noreene Larsson Weeks in Treatment: 21 Wound Status Wound Number: 6 Primary Skin Tear Etiology: Wound Location: Right, Anterior Lower Leg Wound Open Wounding Event: Bump Status: Date Acquired: 06/16/2022 Comorbid Cataracts, Anemia, Congestive Heart Failure, Hypotension, Weeks Of Treatment: 1 History: Peripheral Venous Disease, Osteomyelitis, Neuropathy Clustered Wound: No Photos Wound Measurements Length: (cm) 0.5 Width: (cm) 0.8 Depth: (cm) 0.1 Area: (cm) 0.314 Volume: (cm) 0.031 % Reduction in Area: 20.1% %  Reduction in Volume: 20.5% Epithelialization: None Tunneling: No Undermining: No Wound Description Classification: Full Thickness Without Exposed Support Structures Wound Margin: Distinct, outline attached Exudate Amount: Medium Exudate Type: Serosanguineous Exudate Color: red, brown Foul Odor After Cleansing: No Slough/Fibrino Yes Wound Bed Granulation Amount: Medium (34-66%) Exposed Structure Granulation Quality: Red Fascia Exposed: No Necrotic Amount: Medium (34-66%) Fat Layer (Subcutaneous Tissue) Exposed: Yes Necrotic Quality: Adherent Slough Tendon Exposed: No Muscle Exposed: No Joint Exposed: No Bone Exposed: No Periwound Skin Texture Texture Color No Abnormalities Noted: No No Abnormalities Noted: No Callus: No Atrophie Blanche: No Crepitus: No Cyanosis: No Excoriation: No Ecchymosis: No Induration: No Erythema: No Rash: No Hemosiderin Staining: No Scarring: No Mottled: No Pallor: No Moisture Rubor: No No Abnormalities Noted: No Dry / Scaly: No Maceration: No Treatment Notes Wound #  6 (Lower Leg) Wound Laterality: Right, Anterior Cleanser Wound Cleanser Discharge Instruction: Cleanse the wound with wound cleanser prior to applying a clean dressing using gauze sponges, not tissue or cotton balls. Peri-Wound Care Topical Noga, Zelene [name] ([id number]) [id number] Page 11 of 12 Primary Dressing KerraCel Ag Gelling Fiber Dressing, 2x2 in (silver alginate) Discharge Instruction: Apply silver alginate to wound bed as instructed MediHoney Gel, tube 1.5 (oz) Discharge Instruction: Apply to wound bed as instructed Secondary Dressing Zetuvit Plus Silicone Border Dressing 4x4 (in/in) Discharge Instruction: Apply silicone border over primary dressing as directed. Secured With Compression Wrap Tubigrip size E Discharge Instruction: one layer only. Compression Stockings Add-Ons Electronic Signature(s) Signed: 06/26/2022 4:05:37 PM By:  Fonnie Mu RN Signed: 06/26/2022 6:08:05 PM By: Shawn Stall RN, BSN Entered By: Fonnie Mu on 06/26/2022 13:51:35 -------------------------------------------------------------------------------- Wound Assessment Details Patient Name: Date of Service: [name]. 06/26/2022 1:30 PM Medical Record Number: [id number] Patient Account Number: [id number] Date of Birth/Sex: Treating RN: 1928-04-21 (88 y.o. [name], [name] Primary Care Marcelis Wissner: [name] Other Clinician: Referring Addysin Porco: Treating Jerson Furukawa/Extender: Noreene Larsson Weeks in Treatment: 21 Wound Status Wound Number: 7 Primary Skin Tear Etiology: Wound Location: Right Metatarsal head first Wound Healed - Epithelialized Wounding Event: Skin Tear/Laceration Status: Date Acquired: 06/13/2022 Comorbid Cataracts, Anemia, Congestive Heart Failure, Hypotension, Weeks Of Treatment: 1 History: Peripheral Venous Disease, Osteomyelitis, Neuropathy Clustered Wound: No Photos Wound Measurements Length: (cm) Width: (cm) Depth: (cm) Area: (cm) Volume: (cm) 0 % Reduction in Area: 100% 0 % Reduction in Volume: 100% 0 Epithelialization: Large (67-100%) 0 Tunneling: No 0 Undermining: No Wound Description Classification: Full Thickness Without Exposed Support Structures Wound Margin: Distinct, outline attached [name], [name] ([id number]) Exudate Amount: None Present Foul Odor After Cleansing: No Slough/Fibrino No [id number] Page 12 of 12 Wound Bed Granulation Amount: None Present (0%) Exposed Structure Necrotic Amount: None Present (0%) Fascia Exposed: No Fat Layer (Subcutaneous Tissue) Exposed: No Tendon Exposed: No Muscle Exposed: No Joint Exposed: No Bone Exposed: No Periwound Skin Texture Texture Color No Abnormalities Noted: No No Abnormalities Noted: No Callus: No Atrophie Blanche: No Crepitus: No Cyanosis: No Excoriation: No Ecchymosis:  No Induration: No Erythema: No Rash: No Hemosiderin Staining: No Scarring: No Mottled: No Pallor: No Moisture Rubor: No No Abnormalities Noted: No Dry / Scaly: No Maceration: No Electronic Signature(s) Signed: 06/26/2022 4:05:37 PM By: Fonnie Mu RN Signed: 06/26/2022 6:08:05 PM By: Shawn Stall RN, BSN Entered By: Fonnie Mu on 06/26/2022 13:53:03 -------------------------------------------------------------------------------- Vitals Details Patient Name: Date of Service: [name]. 06/26/2022 1:30 PM Medical Record Number: [id number] Patient Account Number: [id number] Date of Birth/Sex: Treating RN: 12-10-1927 (88 y.o. [name], [name] Primary Care Lestine Rahe: [name] Other Clinician: Referring Paizleigh Wilds: Treating Jonetta Dagley/Extender: Noreene Larsson Weeks in Treatment: 21 Vital Signs Time Taken: 13:40 Temperature (F): 97.9 Height (in): 62 Pulse (bpm): 61 Weight (lbs): 181 Respiratory Rate (breaths/min): 20 Body Mass Index (BMI): 33.1 Blood Pressure (mmHg): 122/49 Reference Range: 80 - 120 mg / dl Electronic Signature(s) Signed: 06/26/2022 6:08:05 PM By: Shawn Stall RN, BSN Entered By: Shawn Stall on 06/26/2022 13:48:55

## 2022-06-27 NOTE — Progress Notes (Signed)
MAHROSH, DONNELL ([id number]) [id number] Page 1 of 12 Visit Report for 06/17/2022 Arrival Information Details Patient Name: Date of Service: [name], [name] 06/17/2022 2:15 PM Medical Record Number: [id number] Patient Account Number: [id number] Date of Birth/Sex: Treating RN: [birth year] (88 y.o. [name], [name] Primary Care Vernica Wachtel: [name] Other Clinician: Referring Scout Guyett: Treating Blaize Nipper/Extender: Noreene Larsson Weeks in Treatment: 20 Visit Information History Since Last Visit All ordered tests and consults were completed: No Patient Arrived: Wheel Chair Added or deleted any medications: No Arrival Time: 14:56 Any new allergies or adverse reactions: No Accompanied By: daughter Pain Present Now: No Transfer Assistance: None Patient Requires Transmission-Based Precautions: No Patient Has Alerts: Yes Patient Alerts: Patient on Blood Thinner Electronic Signature(s) Signed: 06/27/2022 11:35:40 AM By: Dayton Scrape Entered By: Dayton Scrape on 06/17/2022 14:59:13 -------------------------------------------------------------------------------- Clinic Level of Care Assessment Details Patient Name: Date of Service: Megan, [name] 06/17/2022 2:15 PM Medical Record Number: [id number] Patient Account Number: [id number] Date of Birth/Sex: Treating RN: 1927/11/06 (88 y.o. [name], [name] Primary Care Ambry Dix: [name] Other Clinician: Referring Son Barkan: Treating Luv Mish/Extender: Noreene Larsson Weeks in Treatment: 20 Clinic Level of Care Assessment Items TOOL 4 Quantity Score X- 1 0 Use when only an EandM is performed on FOLLOW-UP visit ASSESSMENTS - Nursing Assessment / Reassessment X- 1 10 Reassessment of Co-morbidities (includes updates in patient status) X- 1 5 Reassessment of Adherence to Treatment Plan ASSESSMENTS - Wound and Skin A ssessment / Reassessment []  - 0 Simple Wound Assessment / Reassessment  - one wound X- 4 5 Complex Wound Assessment / Reassessment - multiple wounds []  - 0 Dermatologic / Skin Assessment (not related to wound area) ASSESSMENTS - Focused Assessment X- 1 5 Circumferential Edema Measurements - multi extremities []  - 0 Nutritional Assessment / Counseling / Intervention []  - 0 Lower Extremity Assessment (monofilament, tuning fork, pulses) []  - 0 Peripheral Arterial Disease Assessment (using hand held doppler) ASSESSMENTS - Ostomy and/or Continence Assessment and Care []  - 0 Incontinence Assessment and Management [name], Megan M ( ) [id number] Page 2 of 12 []  - 0 Ostomy Care Assessment and Management (repouching, etc.) PROCESS - Coordination of Care []  - 0 Simple Patient / Family Education for ongoing care X- 1 20 Complex (extensive) Patient / Family Education for ongoing care X- 1 10 Staff obtains Consents, Records, T Results / Process Orders est X- 1 10 Staff telephones HHA, Nursing Homes / Clarify orders / etc []  - 0 Routine Transfer to another Facility (non-emergent condition) []  - 0 Routine Hospital Admission (non-emergent condition) []  - 0 New Admissions / / Ordering NPWT Apligraf, etc. , []  - 0 Emergency Hospital Admission (emergent condition) []  - 0 Simple Discharge Coordination X- 1 15 Complex (extensive) Discharge Coordination PROCESS - Special Needs []  - 0 Pediatric / Minor Patient Management []  - 0 Isolation Patient Management []  - 0 Hearing / Language / Visual special needs []  - 0 Assessment of Community assistance (transportation, D/C planning, etc.) []  - 0 Additional assistance / Altered mentation []  - 0 Support Surface(s) Assessment (bed, cushion, seat, etc.) INTERVENTIONS - Wound Cleansing / Measurement []  - 0 Simple Wound Cleansing - one wound X- 4 5 Complex Wound Cleansing - multiple wounds X- 1 5 Wound Imaging (photographs - any number of wounds) []  -  0 Wound Tracing (instead of photographs) []  - 0 Simple Wound Measurement - one wound X- 4 5 Complex Wound Measurement - multiple wounds INTERVENTIONS - Wound Dressings X -  Small Wound Dressing one or multiple wounds 3 10  - 0 Medium Wound Dressing one or multiple wounds  - 0 Large Wound Dressing one or multiple wounds  - 0 Application of Medications - topical  - 0 Application of Medications - injection INTERVENTIONS - Miscellaneous  - 0 External ear exam  - 0 Specimen Collection (cultures, biopsies, blood, body fluids, etc.)  - 0 Specimen(s) / Culture(s) sent or taken to Lab for analysis  - 0 Patient Transfer (multiple staff / Nurse, adult / Similar devices)  - 0 Simple Staple / Suture removal (25 or less)  - 0 Complex Staple / Suture removal (26 or more)  - 0 Hypo / Hyperglycemic Management (close monitor of Blood Glucose)  - 0 Ankle / Brachial Index (ABI) - do not check if billed separately X- 1 5 Vital Signs Has the patient been seen at the hospital within the last three years: Yes Total Score: 175 Level Of Care: New/Established - Level 5 [name], Megan M ([id number]) [id number] Page 3 of 12 Electronic Signature(s) Signed: 06/17/2022 6:12:04 PM By: Shawn Stall RN, BSN Entered By: Shawn Stall on 06/17/2022 15:22:34 -------------------------------------------------------------------------------- Encounter Discharge Information Details Patient Name: Date of Service: [name]. 06/17/2022 2:15 PM Medical Record Number: [id number] Patient Account Number: [id number] Date of Birth/Sex: Treating RN: [birth year] (88 y.o. [name] Primary Care [name]: [name] Other Clinician: Referring [name]: Treating [name]/Extender: Noreene Larsson Weeks in Treatment: 20 Encounter Discharge Information Items Discharge Condition: Stable Ambulatory Status: Wheelchair Discharge Destination:  Home Transportation: Private Auto Accompanied By: daughter Schedule Follow-up Appointment: Yes Clinical Summary of Care: Electronic Signature(s) Signed: 06/17/2022 6:12:04 PM By: Shawn Stall RN, BSN Entered By: Shawn Stall on 06/17/2022 15:22:58 -------------------------------------------------------------------------------- Lower Extremity Assessment Details Patient Name: Date of Service: [name]. 06/17/2022 2:15 PM Medical Record Number: [id number] Patient Account Number: [id number] Date of Birth/Sex: Treating RN: Jul 03, 1928 (88 y.o. [name] Primary Care Vincent Streater: [name] Other Clinician: Referring Bliss Tsang: Treating Day Deery/Extender: Noreene Larsson Weeks in Treatment: 20 Edema Assessment Assessed: [Left: No] [Right: Yes] Edema: [Left: Ye] [Right: s] Calf Left: Right: Point of Measurement: 30 cm From Medial Instep 43 cm Ankle Left: Right: Point of Measurement: 9 cm From Medial Instep 24 cm Electronic Signature(s) Signed: 06/17/2022 6:12:04 PM By: Shawn Stall RN, BSN Entered By: Shawn Stall on 06/17/2022 15:17:53 Holan, [name] ([id number]) [id number] Page 4 of 12 -------------------------------------------------------------------------------- Multi Wound Chart Details Patient Name: Date of Service: JOSSALIN, CHERVENAK 06/17/2022 2:15 PM Medical Record Number: [id number] Patient Account Number: [id number] Date of Birth/Sex: Treating RN: [birth year] (88 y.o. [name], [name] Primary Care Yousif Edelson: [name] Other Clinician: Referring Sabastien Tyler: Treating Shelena Castelluccio/Extender: Noreene Larsson Weeks in Treatment: 20 Vital Signs Height(in): 62 Pulse(bpm): 69 Weight(lbs): 181 Blood Pressure(mmHg): 110/68 Body Mass Index(BMI): 33.1 Temperature(F): 97.9 Respiratory Rate(breaths/min): 18 [3:Photos:] Right Amputation Site - Toe Right T Great oe Right, Anterior Lower Leg Wound  Location: Surgical Injury Shear/Friction Bump Wounding Event: Open Surgical Wound Abrasion Skin T ear Primary Etiology: Cataracts, Anemia, Congestive Heart Cataracts, Anemia, Congestive Heart Cataracts, Anemia, Congestive Heart Comorbid History: Failure, Hypotension, Peripheral Failure, Hypotension, Peripheral Failure, Hypotension, Peripheral Venous Disease, Osteomyelitis, Venous Disease, Osteomyelitis, Venous Disease, Osteomyelitis, Neuropathy Neuropathy Neuropathy 11/17/2021 02/06/2022 06/16/2022 Date Acquired: 20 18 0 Weeks of Treatment: Open Open Open Wound Status: No No No Wound Recurrence: 0x0x0 0.3x0.5x0.2 0.5x1x0.1 Measurements L x W x D (cm) 0 0.118 0.393 A (cm) : rea 0 0.024 0.039  Volume (cm) : 100.00% 69.40% N/A % Reduction in Area: 100.00% 36.80% N/A % Reduction in Volume: Full Thickness Without Exposed Full Thickness Without Exposed Full Thickness Without Exposed Classification: Support Structures Support Structures Support Structures None Present Medium Medium Exudate Amount: N/A Serosanguineous Serosanguineous Exudate Type: N/A red, brown red, brown Exudate Color: Epibole Distinct, outline attached Distinct, outline attached Wound Margin: None Present (0%) Large (67-100%) Large (67-100%) Granulation Amount: N/A Pink Red Granulation Quality: None Present (0%) None Present (0%) None Present (0%) Necrotic Amount: Fascia: No Fat Layer (Subcutaneous Tissue): Yes Fat Layer (Subcutaneous Tissue): Yes Exposed Structures: Fat Layer (Subcutaneous Tissue): No Fascia: No Fascia: No Tendon: No Tendon: No Tendon: No Muscle: No Muscle: No Muscle: No Joint: No Joint: No Joint: No Bone: No Bone: No Bone: No Large (67-100%) Large (67-100%) None Epithelialization: Excoriation: No Periwound Skin Texture: Induration: No Callus: No Crepitus: No Rash: No Scarring: No Maceration: No Periwound Skin Moisture: Dry/Scaly: No Atrophie Blanche: No Periwound  Skin Color: Cyanosis: No Ecchymosis: No Erythema: No Hemosiderin Staining: No Mottled: No Pallor: No Rubor: No Hodzic, Amylee M ([id number]) [id number] Page 5 of 12 Wound Number: 7 N/A N/A Photos: N/A N/A Right Metatarsal head first N/A N/A Wound Location: Skin T ear/Laceration N/A N/A Wounding Event: Skin T ear N/A N/A Primary Etiology: Cataracts, Anemia, Congestive Heart N/A N/A Comorbid History: Failure, Hypotension, Peripheral Venous Disease, Osteomyelitis, Neuropathy 06/13/2022 N/A N/A Date Acquired: 0 N/A N/A Weeks of Treatment: Open N/A N/A Wound Status: No N/A N/A Wound Recurrence: 0.5x0.2x0.1 N/A N/A Measurements L x W x D (cm) 0.079 N/A N/A A (cm) : rea 0.008 N/A N/A Volume (cm) : N/A N/A N/A % Reduction in Area: N/A N/A N/A % Reduction in Volume: Full Thickness Without Exposed N/A N/A Classification: Support Structures Medium N/A N/A Exudate Amount: Serosanguineous N/A N/A Exudate Type: red, brown N/A N/A Exudate Color: Distinct, outline attached N/A N/A Wound Margin: Large (67-100%) N/A N/A Granulation Amount: Pink, Pale N/A N/A Granulation Quality: None Present (0%) N/A N/A Necrotic Amount: Fat Layer (Subcutaneous Tissue): Yes N/A N/A Exposed Structures: Fascia: No Tendon: No Muscle: No Joint: No Bone: No Small (1-33%) N/A N/A Epithelialization: Excoriation: No N/A N/A Periwound Skin Texture: Induration: No Callus: No Crepitus: No Rash: No Scarring: No Maceration: No N/A N/A Periwound Skin Moisture: Dry/Scaly: No Atrophie Blanche: No N/A N/A Periwound Skin Color: Cyanosis: No Ecchymosis: No Erythema: No Hemosiderin Staining: No Mottled: No Pallor: No Rubor: No Treatment Notes Electronic Signature(s) Signed: 06/17/2022 4:32:14 PM By: Geralyn Corwin DO Signed: 06/17/2022 6:12:04 PM By: Shawn Stall RN, BSN Entered By: Geralyn Corwin on 06/17/2022  15:18:56 -------------------------------------------------------------------------------- Multi-Disciplinary Care Plan Details Patient Name: Date of Service: [name]. 06/17/2022 2:15 PM Medical Record Number: [id number] Patient Account Number: [id number] Date of Birth/Sex: Treating RN: 25-Apr-1928 (88 y.o. [name] Primary Care Yareth Macdonnell: [name] Other Clinician: Referring Royer Cristobal: Treating Relena Ivancic/Extender: Noreene Larsson [place], [place] Judie Petit ([id number]) [id number] Page 6 of 12 Weeks in Treatment: 20 Active Inactive Nutrition Nursing Diagnoses: Potential for alteratiion in Nutrition/Potential for imbalanced nutrition Goals: Patient/caregiver agrees to and verbalizes understanding of need to use nutritional supplements and/or vitamins as prescribed Date Initiated: 01/28/2022 Target Resolution Date: 08/08/2022 Goal Status: Active Interventions: Assess patient nutrition upon admission and as needed per policy Provide education on nutrition Treatment Activities: Education provided on Nutrition : 05/20/2022 Patient referred to Primary Care Physician for further nutritional evaluation : 01/28/2022 Notes: Wound/Skin Impairment Nursing Diagnoses: Knowledge deficit related to ulceration/compromised skin  integrity Goals: Patient/caregiver will verbalize understanding of skin care regimen Date Initiated: 01/28/2022 Target Resolution Date: 08/07/2022 Goal Status: Active Interventions: Assess patient/caregiver ability to perform ulcer/skin care regimen upon admission and as needed Assess ulceration(s) every visit Provide education on ulcer and skin care Treatment Activities: Skin care regimen initiated : 01/28/2022 Topical wound management initiated : 01/28/2022 Notes: Electronic Signature(s) Signed: 06/17/2022 6:12:04 PM By: Shawn Stalleaton, Bobbi RN, BSN Entered By: Shawn Stalleaton, Bobbi on 06/17/2022  15:21:44 -------------------------------------------------------------------------------- Pain Assessment Details Patient Name: Date of Service: Megan ReapHILL, [name] 06/17/2022 2:15 PM Medical Record Number: [id number] Patient Account Number: [id number] Date of Birth/Sex: Treating RN: 09/17/1927 (88 y.o. Megan SilenceF) Deaton, Bobbi Primary Care Cohl Behrens: Jorge NyHayes, Romana Other Clinician: Referring Joangel Vanosdol: Treating Sanjuana Mruk/Extender: Noreene LarssonHoffman, Jessica Hayes, Romana Weeks in Treatment: 20 Active Problems Location of Pain Severity and Description of Pain Patient Has Paino No Site Locations Rate the pain. Modena MorrowHILL, Margurete M ([id number]) [id number] Page 7 of 12 Rate the pain. Current Pain Level: 0 Pain Management and Medication Current Pain Management: Medication: No Cold Application: No Rest: No Massage: No Activity: No T.E.N.S.: No Heat Application: No Leg drop or elevation: No Is the Current Pain Management Adequate: Adequate How does your wound impact your activities of daily livingo Sleep: No Bathing: No Appetite: No Relationship With Others: No Bladder Continence: No Emotions: No Bowel Continence: No Work: No Toileting: No Drive: No Dressing: No Hobbies: No Psychologist, prison and probation serviceslectronic Signature(s) Signed: 06/17/2022 6:12:04 PM By: Shawn Stalleaton, Bobbi RN, BSN Entered By: Shawn Stalleaton, Bobbi on 06/17/2022 15:17:44 -------------------------------------------------------------------------------- Patient/Caregiver Education Details Patient Name: Date of Service: Megan ReapHILL, [name] 10/10/2023andnbsp2:15 PM Medical Record Number: [id number] Patient Account Number: [id number] Date of Birth/Gender: Treating RN: 04/12/1928 (88 y.o. Megan SilenceF) Deaton, Bobbi Primary Care Physician: Jorge NyHayes, Romana Other Clinician: Referring Physician: Treating Physician/Extender: Noreene LarssonHoffman, Jessica Hayes, Romana Weeks in Treatment: 20 Education Assessment Education Provided To: Patient Education Topics Provided Wound/Skin  Impairment: Handouts: Skin Care Do's and Dont's Methods: Explain/Verbal Responses: Reinforcements needed Electronic Signature(s) Signed: 06/17/2022 6:12:04 PM By: Shawn Stalleaton, Bobbi RN, BSN Entered By: Shawn Stalleaton, Bobbi on 06/17/2022 15:21:57 Bacchi, Ginette PitmanHATTIE M ([id number]) [id number] Page 8 of 12 -------------------------------------------------------------------------------- Wound Assessment Details Patient Name: Date of Service: Megan ReapHILL, [name] 06/17/2022 2:15 PM Medical Record Number: [id number] Patient Account Number: [id number] Date of Birth/Sex: Treating RN: 03/09/1928 (88 y.o. Megan PickettF) Deaton, Megan KendallBobbi Primary Care Ezequiel Macauley: Jorge NyHayes, Romana Other Clinician: Referring Arvie Villarruel: Treating Ashlan Dignan/Extender: Noreene LarssonHoffman, Jessica Hayes, Romana Weeks in Treatment: 20 Wound Status Wound Number: 3 Primary Open Surgical Wound Etiology: Wound Location: Right Amputation Site - Toe Wound Open Wounding Event: Surgical Injury Status: Date Acquired: 11/17/2021 Comorbid Cataracts, Anemia, Congestive Heart Failure, Hypotension, Weeks Of Treatment: 20 History: Peripheral Venous Disease, Osteomyelitis, Neuropathy Clustered Wound: No Photos Wound Measurements Length: (cm) Width: (cm) Depth: (cm) Area: (cm) Volume: (cm) 0 % Reduction in Area: 100% 0 % Reduction in Volume: 100% 0 Epithelialization: Large (67-100%) 0 Tunneling: No 0 Undermining: No Wound Description Classification: Full Thickness Without Exposed Support Structures Wound Margin: Epibole Exudate Amount: None Present Foul Odor After Cleansing: No Slough/Fibrino No Wound Bed Granulation Amount: None Present (0%) Exposed Structure Necrotic Amount: None Present (0%) Fascia Exposed: No Fat Layer (Subcutaneous Tissue) Exposed: No Tendon Exposed: No Muscle Exposed: No Joint Exposed: No Bone Exposed: No Periwound Skin Texture Texture Color No Abnormalities Noted: No No Abnormalities Noted: No Moisture No  Abnormalities Noted: No Electronic Signature(s) Signed: 06/17/2022 6:12:04 PM By: Shawn Stalleaton, Bobbi RN, BSN Entered By: Shawn Stalleaton, Bobbi on 06/17/2022 15:11:40 Dinse, Ginette PitmanHATTIE M ([id number]) [id number] Page 9  of 12 -------------------------------------------------------------------------------- Wound Assessment Details Patient Name: Date of Service: ASHARI, LLEWELLYN 06/17/2022 2:15 PM Medical Record Number: [id number] Patient Account Number: [id number] Date of Birth/Sex: Treating RN: 10-05-1927 (88 y.o. [name], Tammi Klippel Primary Care Mahlon Gabrielle: Windy Carina Other Clinician: Referring Gotham Raden: Treating Kelsha Older/Extender: Veneda Melter Weeks in Treatment: 20 Wound Status Wound Number: 5 Primary Abrasion Etiology: Wound Location: Right T Great oe Wound Open Wounding Event: Shear/Friction Status: Date Acquired: 02/06/2022 Comorbid Cataracts, Anemia, Congestive Heart Failure, Hypotension, Weeks Of Treatment: 18 History: Peripheral Venous Disease, Osteomyelitis, Neuropathy Clustered Wound: No Photos Wound Measurements Length: (cm) 0.3 Width: (cm) 0.5 Depth: (cm) 0.2 Area: (cm) 0.118 Volume: (cm) 0.024 % Reduction in Area: 69.4% % Reduction in Volume: 36.8% Epithelialization: Large (67-100%) Wound Description Classification: Full Thickness Without Exposed Suppo Wound Margin: Distinct, outline attached Exudate Amount: Medium Exudate Type: Serosanguineous Exudate Color: red, brown rt Structures Foul Odor After Cleansing: No Slough/Fibrino No Wound Bed Granulation Amount: Large (67-100%) Exposed Structure Granulation Quality: Pink Fascia Exposed: No Necrotic Amount: None Present (0%) Fat Layer (Subcutaneous Tissue) Exposed: Yes Tendon Exposed: No Muscle Exposed: No Joint Exposed: No Bone Exposed: No Periwound Skin Texture Texture Color No Abnormalities Noted: No No Abnormalities Noted: No Moisture No Abnormalities Noted:  No Electronic Signature(s) Signed: 06/17/2022 6:12:04 PM By: Deon Pilling RN, BSN Entered By: Deon Pilling on 06/17/2022 15:15:33 Snellings, Marylynn Pearson ([id number]) [id number] Page 10 of 12 -------------------------------------------------------------------------------- Wound Assessment Details Patient Name: Date of Service: TIMBERLYN, PICKFORD 06/17/2022 2:15 PM Medical Record Number: [id number] Patient Account Number: [id number] Date of Birth/Sex: Treating RN: June 19, 1928 (88 y.o. [name], Tammi Klippel Primary Care Ellina Sivertsen: Windy Carina Other Clinician: Referring Jene Huq: Treating Koren Plyler/Extender: Veneda Melter Weeks in Treatment: 20 Wound Status Wound Number: 6 Primary Skin Tear Etiology: Wound Location: Right, Anterior Lower Leg Wound Open Wounding Event: Bump Status: Date Acquired: 06/16/2022 Comorbid Cataracts, Anemia, Congestive Heart Failure, Hypotension, Weeks Of Treatment: 0 History: Peripheral Venous Disease, Osteomyelitis, Neuropathy Clustered Wound: No Photos Wound Measurements Length: (cm) 0.5 Width: (cm) 1 Depth: (cm) 0.1 Area: (cm) 0.393 Volume: (cm) 0.039 % Reduction in Area: % Reduction in Volume: Epithelialization: None Tunneling: No Undermining: No Wound Description Classification: Full Thickness Without Exposed Suppo Wound Margin: Distinct, outline attached Exudate Amount: Medium Exudate Type: Serosanguineous Exudate Color: red, brown rt Structures Foul Odor After Cleansing: No Slough/Fibrino No Wound Bed Granulation Amount: Large (67-100%) Exposed Structure Granulation Quality: Red Fascia Exposed: No Necrotic Amount: None Present (0%) Fat Layer (Subcutaneous Tissue) Exposed: Yes Tendon Exposed: No Muscle Exposed: No Joint Exposed: No Bone Exposed: No Periwound Skin Texture Texture Color No Abnormalities Noted: No No Abnormalities Noted: No Callus: No Atrophie Blanche: No Crepitus: No Cyanosis:  No Excoriation: No Ecchymosis: No Induration: No Erythema: No Rash: No Hemosiderin Staining: No Scarring: No Mottled: No Pallor: No Moisture Rubor: No No Abnormalities Noted: No Dry / Scaly: No Maceration: No Ditmars, Hitomi M ([id number]) [id number] Page 11 of 12 Electronic Signature(s) Signed: 06/17/2022 6:12:04 PM By: Deon Pilling RN, BSN Entered By: Deon Pilling on 06/17/2022 15:13:19 -------------------------------------------------------------------------------- Wound Assessment Details Patient Name: Date of Service: Alonza Bogus. 06/17/2022 2:15 PM Medical Record Number: [id number] Patient Account Number: [id number] Date of Birth/Sex: Treating RN: [birth year] (88 y.o. [name] Primary Care Stefanny Pieri: Windy Carina Other Clinician: Referring Darric Plante: Treating Landers Prajapati/Extender: Veneda Melter Weeks in Treatment: 20 Wound Status Wound Number: 7 Primary Skin Tear Etiology: Wound Location: Right Metatarsal head first Wound Open Wounding Event: Skin  Tear/Laceration Status: Date Acquired: 06/13/2022 Comorbid Cataracts, Anemia, Congestive Heart Failure, Hypotension, Weeks Of Treatment: 0 History: Peripheral Venous Disease, Osteomyelitis, Neuropathy Clustered Wound: No Photos Wound Measurements Length: (cm) 0.5 Width: (cm) 0.2 Depth: (cm) 0.1 Area: (cm) 0.079 Volume: (cm) 0.008 % Reduction in Area: % Reduction in Volume: Epithelialization: Small (1-33%) Tunneling: No Undermining: No Wound Description Classification: Full Thickness Without Exposed Support Structures Wound Margin: Distinct, outline attached Exudate Amount: Medium Exudate Type: Serosanguineous Exudate Color: red, brown Foul Odor After Cleansing: No Slough/Fibrino No Wound Bed Granulation Amount: Large (67-100%) Exposed Structure Granulation Quality: Pink, Pale Fascia Exposed: No Necrotic Amount: None Present (0%) Fat Layer (Subcutaneous  Tissue) Exposed: Yes Tendon Exposed: No Muscle Exposed: No Joint Exposed: No Bone Exposed: No Periwound Skin Texture Texture Color No Abnormalities Noted: No No Abnormalities Noted: No Callus: No Atrophie Blanche: No Crepitus: No Cyanosis: No Excoriation: No Ecchymosis: No Induration: No Erythema: No Urbanczyk, Hadiya M ([id number]) [id number] Page 12 of 12 Rash: No Hemosiderin Staining: No Scarring: No Mottled: No Pallor: No Moisture Rubor: No No Abnormalities Noted: No Dry / Scaly: No Maceration: No Electronic Signature(s) Signed: 06/17/2022 6:12:04 PM By: Shawn Stall RN, BSN Entered By: Shawn Stall on 06/17/2022 15:15:13 -------------------------------------------------------------------------------- Vitals Details Patient Name: Date of Service: [name]. 06/17/2022 2:15 PM Medical Record Number: [id number] Patient Account Number: [id number] Date of Birth/Sex: Treating RN: 1927/10/15 (88 y.o. [name], [name] Primary Care Brenen Beigel: [name] Other Clinician: Referring Yaw Escoto: Treating Skyley Grandmaison/Extender: Noreene Larsson Weeks in Treatment: 20 Vital Signs Time Taken: 14:54 Temperature (F): 97.9 Height (in): 62 Pulse (bpm): 69 Weight (lbs): 181 Respiratory Rate (breaths/min): 18 Body Mass Index (BMI): 33.1 Blood Pressure (mmHg): 110/68 Reference Range: 80 - 120 mg / dl Electronic Signature(s) Signed: 06/27/2022 11:35:40 AM By: Dayton Scrape Entered By: Dayton Scrape on 06/17/2022 15:01:00

## 2022-07-11 ENCOUNTER — Encounter (HOSPITAL_BASED_OUTPATIENT_CLINIC_OR_DEPARTMENT_OTHER): Payer: Medicare HMO | Attending: Internal Medicine | Admitting: Internal Medicine

## 2022-07-11 DIAGNOSIS — J449 Chronic obstructive pulmonary disease, unspecified: Secondary | ICD-10-CM | POA: Insufficient documentation

## 2022-07-11 DIAGNOSIS — I890 Lymphedema, not elsewhere classified: Secondary | ICD-10-CM | POA: Diagnosis not present

## 2022-07-11 DIAGNOSIS — G629 Polyneuropathy, unspecified: Secondary | ICD-10-CM | POA: Diagnosis not present

## 2022-07-11 DIAGNOSIS — L97512 Non-pressure chronic ulcer of other part of right foot with fat layer exposed: Secondary | ICD-10-CM | POA: Insufficient documentation

## 2022-07-11 DIAGNOSIS — Z7901 Long term (current) use of anticoagulants: Secondary | ICD-10-CM | POA: Diagnosis not present

## 2022-07-11 DIAGNOSIS — S80212A Abrasion, left knee, initial encounter: Secondary | ICD-10-CM | POA: Insufficient documentation

## 2022-07-11 DIAGNOSIS — I509 Heart failure, unspecified: Secondary | ICD-10-CM | POA: Diagnosis not present

## 2022-07-11 DIAGNOSIS — I872 Venous insufficiency (chronic) (peripheral): Secondary | ICD-10-CM | POA: Diagnosis not present

## 2022-07-11 DIAGNOSIS — I4891 Unspecified atrial fibrillation: Secondary | ICD-10-CM | POA: Diagnosis not present

## 2022-07-11 DIAGNOSIS — E039 Hypothyroidism, unspecified: Secondary | ICD-10-CM | POA: Diagnosis not present

## 2022-07-11 DIAGNOSIS — I739 Peripheral vascular disease, unspecified: Secondary | ICD-10-CM | POA: Diagnosis not present

## 2022-07-11 DIAGNOSIS — S81802A Unspecified open wound, left lower leg, initial encounter: Secondary | ICD-10-CM | POA: Insufficient documentation

## 2022-07-12 NOTE — Progress Notes (Signed)
[name], [name] ([id number]) [id number] Page 1 of 10 Visit Report for 07/11/2022 Arrival Information Details Patient Name: Date of Service: [name], ORDERS. 07/11/2022 9:45 A [name] Medical Record Number: [id number] Patient Account Number: [id number] Date of Birth/Sex: Treating RN: 29-Sep-1927 (88 y.o. F) Primary Care Kinan Safley: Windy Carina Other Clinician: Referring Elham Fini: Treating Rayma Hegg/Extender: Lennox Laity in Treatment: 23 Visit Information History Since Last Visit Added or deleted any medications: Yes Patient Arrived: Wheel Chair Any new allergies or adverse reactions: No Arrival Time: 09:59 Had a fall or experienced change in No Accompanied By: daughter activities of daily living that may affect Transfer Assistance: None risk of falls: Patient Identification Verified: Yes Signs or symptoms of abuse/neglect since last visito No Secondary Verification Process Completed: Yes Hospitalized since last visit: No Patient Requires Transmission-Based Precautions: No Implantable device outside of the clinic excluding No Patient Has Alerts: Yes cellular tissue based products placed in the center Patient Alerts: Patient on Blood Thinner since last visit: Has Dressing in Place as Prescribed: Yes Pain Present Now: No Notes per daughter monday patient had redness and wound to right fifth, went to urgent care for oral antibiotics, and now no wound or redness to right 5th toe. [name] made aware. Electronic Signature(s) Signed: 07/11/2022 5:25:29 PM By: Deon Pilling RN, BSN Entered By: Deon Pilling on 07/11/2022 10:38:34 -------------------------------------------------------------------------------- Encounter Discharge Information Details Patient Name: Date of Service: Megan Bogus. 07/11/2022 9:45 A [name] Medical Record Number: [id number] Patient Account Number: [id number] Date of Birth/Sex: Treating RN: Sep 14, 1927 (88 y.o. [name] Primary Care Toshio Slusher: Windy Carina Other Clinician: Referring Lizbett Garciagarcia: Treating Alex Mcmanigal/Extender: Lennox Laity in Treatment: 23 Encounter Discharge Information Items Post Procedure Vitals Discharge Condition: Stable Temperature (F): 98 Ambulatory Status: Wheelchair Pulse (bpm): 65 Discharge Destination: Home Respiratory Rate (breaths/min): 20 Transportation: Private Auto Blood Pressure (mmHg): 110/64 Accompanied By: daughter Schedule Follow-up Appointment: Yes Clinical Summary of Care: Electronic Signature(s) Signed: 07/11/2022 5:25:29 PM By: Deon Pilling RN, BSN Entered By: Deon Pilling on 07/11/2022 10:42:59 [name], [name] ([id number]) [id number] Page 2 of 10 -------------------------------------------------------------------------------- Lower Extremity Assessment Details Patient Name: Date of Service: [name], [name] 07/11/2022 9:45 A [name] Medical Record Number: [id number] Patient Account Number: [id number] Date of Birth/Sex: Treating RN: 11-09-1927 (88 y.o. F) Primary Care Dechelle Attaway: Windy Carina Other Clinician: Referring Sally-Anne Wamble: Treating Taijon Vink/Extender: Harlon Ditty, Radene Journey in Treatment: 23 Edema Assessment Assessed: Shirlyn Goltz: No] [Right: No] Edema: [Left: Ye] [Right: s] Calf Left: Right: Point of Measurement: 30 cm From Medial Instep 45 cm Ankle Left: Right: Point of Measurement: 9 cm From Medial Instep 24.5 cm Electronic Signature(s) Signed: 07/11/2022 12:37:25 PM By: Erenest Blank Entered By: Erenest Blank on 07/11/2022 10:14:10 -------------------------------------------------------------------------------- Multi Wound Chart Details Patient Name: Date of Service: Megan Bogus. 07/11/2022 9:45 A [name] Medical Record Number: [id number] Patient Account Number: [id number] Date of Birth/Sex: Treating RN: [birth year] (88 y.o. F) Primary Care Reata Petrov: Windy Carina Other Clinician: Referring  Jawann Urbani: Treating Shailene Demonbreun/Extender: Harlon Ditty, Radene Journey in Treatment: 23 Vital Signs Height(in): 62 Pulse(bpm): 65 Weight(lbs): 181 Blood Pressure(mmHg): 110/64 Body Mass Index(BMI): 33.1 Temperature(F): 98 Respiratory Rate(breaths/min): 18 [5:Photos:] [N/A:N/A] Right T Great oe Right, Anterior Lower Leg N/A Wound Location: Shear/Friction Bump N/A Wounding Event: Abrasion Skin Tear N/A Primary Etiology: Cataracts, Anemia, Congestive Heart Cataracts, Anemia, Congestive Heart N/A Comorbid History: Failure, Hypotension, Peripheral Failure, Hypotension, Peripheral Venous Disease, Osteomyelitis, Venous Disease, Osteomyelitis, [name], [name] ([id number]) [id number] Page 3 of 10  Neuropathy Neuropathy 02/06/2022 06/16/2022 N/A Date Acquired: 22 3 N/A Weeks of Treatment: Open Open N/A Wound Status: No No N/A Wound Recurrence: 0.1x0.1x0.1 0.6x0.5x0.1 N/A Measurements L x W x D (cm) 0.008 0.236 N/A A (cm) : rea 0.001 0.024 N/A Volume (cm) : 97.90% 39.90% N/A % Reduction in Area: 97.40% 38.50% N/A % Reduction in Volume: Full Thickness Without Exposed Full Thickness Without Exposed N/A Classification: Support Structures Support Structures None Present Medium N/A Exudate A mount: N/A Serosanguineous N/A Exudate Type: N/A red, brown N/A Exudate Color: Distinct, outline attached Distinct, outline attached N/A Wound Margin: None Present (0%) None Present (0%) N/A Granulation A mount: None Present (0%) Large (67-100%) N/A Necrotic A mount: Fat Layer (Subcutaneous Tissue): Yes Fat Layer (Subcutaneous Tissue): Yes N/A Exposed Structures: Fascia: No Fascia: No Tendon: No Tendon: No Muscle: No Muscle: No Joint: No Joint: No Bone: No Bone: No Large (67-100%) None N/A Epithelialization: Debridement - Selective/Open Wound N/A N/A Debridement: Pre-procedure Verification/Time Out 10:30 N/A N/A Taken: Lidocaine 5% topical  ointment N/A N/A Pain Control: Necrotic/Eschar, Callus N/A N/A Tissue Debrided: Non-Viable Tissue N/A N/A Level: 0.09 N/A N/A Debridement A (sq cm): rea Curette N/A N/A Instrument: None N/A N/A Bleeding: Procedure was tolerated well N/A N/A Debridement Treatment Response: 0.2x0.2x0.2 N/A N/A Post Debridement Measurements L x W x D (cm) 0.006 N/A N/A Post Debridement Volume: (cm) Callus: Yes Excoriation: No N/A Periwound Skin Texture: Excoriation: No Induration: No Induration: No Callus: No Crepitus: No Crepitus: No Rash: No Rash: No Scarring: No Scarring: No Dry/Scaly: Yes Maceration: No N/A Periwound Skin Moisture: Maceration: No Dry/Scaly: No Atrophie Blanche: No Atrophie Blanche: No N/A Periwound Skin Color: Cyanosis: No Cyanosis: No Ecchymosis: No Ecchymosis: No Erythema: No Erythema: No Hemosiderin Staining: No Hemosiderin Staining: No Mottled: No Mottled: No Pallor: No Pallor: No Rubor: No Rubor: No Debridement N/A N/A Procedures Performed: Treatment Notes Wound #5 (Toe Great) Wound Laterality: Right Cleanser Soap and Water Discharge Instruction: May shower and wash wound with dial antibacterial soap and water prior to dressing change. Wound Cleanser Discharge Instruction: Cleanse the wound with wound cleanser prior to applying a clean dressing using gauze sponges, not tissue or cotton balls. Peri-Wound Care Topical Topical antibiotics Discharge Instruction: apply directly to wound bed. Primary Dressing FIBRACOL Plus Dressing, 2x2 in (collagen) Discharge Instruction: Moisten collagen with saline or hydrogel Secondary Dressing Woven Gauze Sponges 2x2 in Discharge Instruction: Apply over primary dressing. may tape down with medipore tape. Secured With [employer], Sterile 2x75 (in/in) Hermans, Naraly [name] ([id number]) [id number] Page 4 of 10 Discharge Instruction: Secure with stretch gauze or  use paper tape to secure the gauze. Paper Tape, 2x10 (in/yd) Discharge Instruction: Secure dressing with tape as directed. Compression Wrap Compression Stockings Add-Ons Wound #6 (Lower Leg) Wound Laterality: Right, Anterior Cleanser Soap and Water Discharge Instruction: May shower and wash wound with dial antibacterial soap and water prior to dressing change. Wound Cleanser Discharge Instruction: Cleanse the wound with wound cleanser prior to applying a clean dressing using gauze sponges, not tissue or cotton balls. Peri-Wound Care Topical Topical antibiotics Discharge Instruction: apply directly to wound bed. Primary Dressing FIBRACOL Plus Dressing, 2x2 in (collagen) Discharge Instruction: Moisten collagen with saline or hydrogel Secondary Dressing Woven Gauze Sponges 2x2 in Discharge Instruction: Apply over primary dressing. may tape down with medipore tape. Secured With Paper Tape, 2x10 (in/yd) Discharge Instruction: Secure dressing with tape as directed. Compression Wrap Compression Stockings Add-Ons Electronic Signature(s) Signed: 07/11/2022 12:29:37 PM By: Linton Ham  MD Entered By: Linton Ham on 07/11/2022 10:44:59 -------------------------------------------------------------------------------- Multi-Disciplinary Care Plan Details Patient Name: Date of Service: [name], MELONI. 07/11/2022 9:45 A [name] Medical Record Number: [id number] Patient Account Number: [id number] Date of Birth/Sex: Treating RN: February 26, 1928 (88 y.o. [name] Primary Care Oumou Smead: Windy Carina Other Clinician: Referring Quanesha Klimaszewski: Treating Kimon Loewen/Extender: Lennox Laity in Treatment: 23 Active Inactive Nutrition Nursing Diagnoses: Potential for alteratiion in Nutrition/Potential for imbalanced nutrition Goals: Patient/caregiver agrees to and verbalizes understanding of need to use nutritional supplements and/or vitamins as prescribed [name], [name]  ([id number]) [id number] Page 5 of 10 Date Initiated: 01/28/2022 Target Resolution Date: 08/08/2022 Goal Status: Active Interventions: Assess patient nutrition upon admission and as needed per policy Provide education on nutrition Treatment Activities: Education provided on Nutrition : 05/20/2022 Patient referred to Primary Care Physician for further nutritional evaluation : 01/28/2022 Notes: Wound/Skin Impairment Nursing Diagnoses: Knowledge deficit related to ulceration/compromised skin integrity Goals: Patient/caregiver will verbalize understanding of skin care regimen Date Initiated: 01/28/2022 Target Resolution Date: 08/07/2022 Goal Status: Active Interventions: Assess patient/caregiver ability to perform ulcer/skin care regimen upon admission and as needed Assess ulceration(s) every visit Provide education on ulcer and skin care Treatment Activities: Skin care regimen initiated : 01/28/2022 Topical wound management initiated : 01/28/2022 Notes: Electronic Signature(s) Signed: 07/11/2022 5:25:29 PM By: Deon Pilling RN, BSN Entered By: Deon Pilling on 07/11/2022 10:35:32 -------------------------------------------------------------------------------- Pain Assessment Details Patient Name: Date of Service: Megan Bogus. 07/11/2022 9:45 A [name] Medical Record Number: [id number] Patient Account Number: [id number] Date of Birth/Sex: Treating RN: [birth year] (88 y.o. F) Primary Care Yeiren Whitecotton: Windy Carina Other Clinician: Referring Bailen Geffre: Treating Bettie Capistran/Extender: Lennox Laity in Treatment: 23 Active Problems Location of Pain Severity and Description of Pain Patient Has Paino No Site Locations [place], Lyriq [name] ([id number]) [id number] Page 6 of 10 Pain Management and Medication Current Pain Management: Electronic Signature(s) Signed: 07/11/2022 12:37:25 PM By: Erenest Blank Entered By: Erenest Blank on  07/11/2022 10:01:59 -------------------------------------------------------------------------------- Patient/Caregiver Education Details Patient Name: Date of Service: Megan Bogus 11/3/2023andnbsp9:45 A [name] Medical Record Number: [id number] Patient Account Number: [id number] Date of Birth/Gender: Treating RN: 04/17/1928 (88 y.o. [name] Primary Care Physician: Windy Carina Other Clinician: Referring Physician: Treating Physician/Extender: Lennox Laity in Treatment: 23 Education Assessment Education Provided To: Patient Education Topics Provided Wound/Skin Impairment: Handouts: Caring for Your Ulcer Methods: Explain/Verbal Responses: Reinforcements needed Electronic Signature(s) Signed: 07/11/2022 5:25:29 PM By: Deon Pilling RN, BSN Entered By: Deon Pilling on 07/11/2022 10:36:21 -------------------------------------------------------------------------------- Wound Assessment Details Patient Name: Date of Service: Megan Bogus. 07/11/2022 9:45 A [name] Medical Record Number: [id number] Patient Account Number: [id number] Date of Birth/Sex: Treating RN: [birth year] (88 y.o. F) Primary Care Jenne Sellinger: Windy Carina Other Clinician: SHATERRICA, [name] ([id number]) [id number] Page 7 of 10 Referring Kimbrely Buckel: Treating Daelyn Mozer/Extender: Harlon Ditty, Radene Journey in Treatment: 23 Wound Status Wound Number: 5 Primary Abrasion Etiology: Wound Location: Right T Great oe Wound Open Wounding Event: Shear/Friction Status: Date Acquired: 02/06/2022 Comorbid Cataracts, Anemia, Congestive Heart Failure, Hypotension, Weeks Of Treatment: 22 History: Peripheral Venous Disease, Osteomyelitis, Neuropathy Clustered Wound: No Photos Wound Measurements Length: (cm) 0.1 Width: (cm) 0.1 Depth: (cm) 0.1 Area: (cm) 0.008 Volume: (cm) 0.001 % Reduction in Area: 97.9% % Reduction in Volume: 97.4% Epithelialization: Large  (67-100%) Tunneling: No Undermining: No Wound Description Classification: Full Thickness Without Exposed Support Structures Wound Margin: Distinct, outline attached Exudate Amount: None Present Foul Odor After Cleansing: No Slough/Fibrino No Wound  Bed Granulation Amount: None Present (0%) Exposed Structure Necrotic Amount: None Present (0%) Fascia Exposed: No Fat Layer (Subcutaneous Tissue) Exposed: Yes Tendon Exposed: No Muscle Exposed: No Joint Exposed: No Bone Exposed: No Periwound Skin Texture Texture Color No Abnormalities Noted: No No Abnormalities Noted: No Callus: Yes Atrophie Blanche: No Crepitus: No Cyanosis: No Excoriation: No Ecchymosis: No Induration: No Erythema: No Rash: No Hemosiderin Staining: No Scarring: No Mottled: No Pallor: No Moisture Rubor: No No Abnormalities Noted: No Dry / Scaly: Yes Maceration: No Treatment Notes Wound #5 (Toe Great) Wound Laterality: Right Cleanser Soap and Water Discharge Instruction: May shower and wash wound with dial antibacterial soap and water prior to dressing change. Wound Cleanser Discharge Instruction: Cleanse the wound with wound cleanser prior to applying a clean dressing using gauze sponges, not tissue or cotton balls. Peri-Wound Care Topical [name], [name] ([id number]) [id number] Page 8 of 10 Topical antibiotics Discharge Instruction: apply directly to wound bed. Primary Dressing FIBRACOL Plus Dressing, 2x2 in (collagen) Discharge Instruction: Moisten collagen with saline or hydrogel Secondary Dressing Woven Gauze Sponges 2x2 in Discharge Instruction: Apply over primary dressing. may tape down with medipore tape. Secured With [employer] Stretch Gauze Bandage, Sterile 2x75 (in/in) Discharge Instruction: Secure with stretch gauze or use paper tape to secure the gauze. Paper Tape, 2x10 (in/yd) Discharge Instruction: Secure dressing with tape as directed. Compression  Wrap Compression Stockings Add-Ons Electronic Signature(s) Signed: 07/11/2022 12:37:25 PM By: Erenest Blank Entered By: Erenest Blank on 07/11/2022 10:15:32 -------------------------------------------------------------------------------- Wound Assessment Details Patient Name: Date of Service: Megan Bogus. 07/11/2022 9:45 A [name] Medical Record Number: [id number] Patient Account Number: [id number] Date of Birth/Sex: Treating RN: [birth year] (88 y.o. F) Primary Care Excell Neyland: Windy Carina Other Clinician: Referring Kenetha Cozza: Treating Merry [place]/Extender: Harlon Ditty, Radene Journey in Treatment: 23 Wound Status Wound Number: 6 Primary Skin Tear Etiology: Wound Location: Right, Anterior Lower Leg Wound Open Wounding Event: Bump Status: Date Acquired: 06/16/2022 Comorbid Cataracts, Anemia, Congestive Heart Failure, Hypotension, Weeks Of Treatment: 3 History: Peripheral Venous Disease, Osteomyelitis, Neuropathy Clustered Wound: No Photos Wound Measurements Length: (cm) 0.6 Width: (cm) 0.5 Depth: (cm) 0.1 Area: (cm) 0.236 Volume: (cm) 0.024 % Reduction in Area: 39.9% % Reduction in Volume: 38.5% Epithelialization: None Tunneling: No Undermining: No Wound Description [name], [name] ([id number]) Classification: Full Thickness Without Exposed Support Structures Wound Margin: Distinct, outline attached Exudate Amount: Medium Exudate Type: Serosanguineous Exudate Color: red, brown [id number] Page 9 of 10 Foul Odor After Cleansing: No Slough/Fibrino Yes Wound Bed Granulation Amount: None Present (0%) Exposed Structure Necrotic Amount: Large (67-100%) Fascia Exposed: No Necrotic Quality: Adherent Slough Fat Layer (Subcutaneous Tissue) Exposed: Yes Tendon Exposed: No Muscle Exposed: No Joint Exposed: No Bone Exposed: No Periwound Skin Texture Texture Color No Abnormalities Noted: No No Abnormalities Noted: No Callus: No Atrophie  Blanche: No Crepitus: No Cyanosis: No Excoriation: No Ecchymosis: No Induration: No Erythema: No Rash: No Hemosiderin Staining: No Scarring: No Mottled: No Pallor: No Moisture Rubor: No No Abnormalities Noted: No Dry / Scaly: No Maceration: No Treatment Notes Wound #6 (Lower Leg) Wound Laterality: Right, Anterior Cleanser Soap and Water Discharge Instruction: May shower and wash wound with dial antibacterial soap and water prior to dressing change. Wound Cleanser Discharge Instruction: Cleanse the wound with wound cleanser prior to applying a clean dressing using gauze sponges, not tissue or cotton balls. Peri-Wound Care Topical Topical antibiotics Discharge Instruction: apply directly to wound bed. Primary Dressing FIBRACOL Plus Dressing, 2x2 in (collagen) Discharge Instruction: Moisten collagen with saline  or hydrogel Secondary Dressing Woven Gauze Sponges 2x2 in Discharge Instruction: Apply over primary dressing. may tape down with medipore tape. Secured With Paper Tape, 2x10 (in/yd) Discharge Instruction: Secure dressing with tape as directed. Compression Wrap Compression Stockings Add-Ons Electronic Signature(s) Signed: 07/11/2022 12:37:25 PM By: Erenest Blank Entered By: Erenest Blank on 07/11/2022 10:16:17 Reining, [name] ([id number]) [id number] Page 10 of 10 -------------------------------------------------------------------------------- Vitals Details Patient Name: Date of Service: [name], [name] 07/11/2022 9:45 A [name] Medical Record Number: [id number] Patient Account Number: [id number] Date of Birth/Sex: Treating RN: 05-20-1928 (88 y.o. F) Primary Care Damondre Pfeifle: Windy Carina Other Clinician: Referring Gaylin Bulthuis: Treating Rayanne Padmanabhan/Extender: Harlon Ditty, Radene Journey in Treatment: 23 Vital Signs Time Taken: 10:02 Temperature (F): 98 Height (in): 62 Pulse (bpm): 65 Weight (lbs): 181 Respiratory Rate (breaths/min):  18 Body Mass Index (BMI): 33.1 Blood Pressure (mmHg): 110/64 Reference Range: 80 - 120 mg / dl Electronic Signature(s) Signed: 07/11/2022 12:37:25 PM By: Erenest Blank Entered By: Erenest Blank on 07/11/2022 10:06:58

## 2022-07-12 NOTE — Progress Notes (Signed)
Megan, [name] ([id number]) [id number] Page 1 of 8 Visit Report for 07/11/2022 Debridement Details Patient Name: Date of Service: [name]. 07/11/2022 9:45 A [name] Medical Record Number: [id number] Patient Account Number: [id number] Date of Birth/Sex: Treating RN: April 22, 1928 (88 y.o. F) Primary Care Provider: Windy Carina Other Clinician: Referring Provider: Treating Provider/Extender: Lennox Laity in Treatment: 23 Debridement Performed for Assessment: Wound #5 Right T Great oe Performed By: Physician [name]., MD Debridement Type: Debridement Level of Consciousness (Pre-procedure): Awake and Alert Pre-procedure Verification/Time Out Yes - 10:30 Taken: Start Time: 10:31 Pain Control: Lidocaine 5% topical ointment T Area Debrided (L x W): otal 0.3 (cm) x 0.3 (cm) = 0.09 (cm) Tissue and other material debrided: Non-Viable, Callus, Eschar Level: Non-Viable Tissue Debridement Description: Selective/Open Wound Instrument: Curette Bleeding: None Response to Treatment: Procedure was tolerated well Level of Consciousness (Post- Awake and Alert procedure): Post Debridement Measurements of Total Wound Length: (cm) 0.2 Width: (cm) 0.2 Depth: (cm) 0.2 Volume: (cm) 0.006 Character of Wound/Ulcer Post Debridement: Improved Post Procedure Diagnosis Same as Pre-procedure Electronic Signature(s) Signed: 07/11/2022 12:29:37 PM By: Megan Ham MD Entered By: [name] on 07/11/2022 10:45:09 -------------------------------------------------------------------------------- HPI Details Patient Name: Date of Service: Megan Bogus. 07/11/2022 9:45 A [name] Medical Record Number: [id number] Patient Account Number: [id number] Date of Birth/Sex: Treating RN: [birth year] (88 y.o. F) Primary Care Provider: Windy Carina Other Clinician: Referring Provider: Treating Provider/Extender: Harlon Ditty, Radene Journey in  Treatment: 23 History of Present Illness HPI Description: Admission 01/28/2022 [name] is a 88 year old female with a past medical history of idiopathic peripheral neuropathy status post amputation to the second right toe secondary to osteomyelitis, COPD and A-fib on Coumadin the presents to the clinic for a 52-month history of nonhealing ulcer to a previous amputation site on the second right toe. She states she has tried Medihoney and silver alginate in the past to this area with little benefit. She also has 2 small areas limited to skin breakdown to her lower extremities bilaterally. She has chronic venous insufficiency but not has not been wearing her compression stockings. She states she bumped her [name] ([id number]) [id number] Page 2 of 8 legs against an object and not so the wound started. She has been using Medihoney to the sites. She denies signs of infection. 6/2; patient presents for follow-up. She had an x-ray of her right foot done at last clinic visit and this was negative for evidence of osteomyelitis. She also had a wound culture done that showed extra high levels of Staph aureus. I recommended Keystone antibiotics for this and this was ordered. She had ABIs with TBI's done as well that showed monophasic waveforms to the right foot with TBI of 0 and an ABI of 0.52. Urgent referral was made to vein and vascular and she saw Dr. Doren [name] on 6/1, yesterday and he recommended an arteriogram. This is scheduled for 6/16. Patient also reports a new wound to the right great toe. This is a blister that has ruptured. She also reports increased erythema to the toe. 6/6; the patient was worked in urgently today at the insistence of her daughter out of concern for a new wound on the lateral part of the plantar right great toe. She has her original postsurgical wound after the amputation of the right second toe, she has a wound on the medial part of the right  great toe. The patient is apparently going for an angiogram by Dr. Doren [name]  in 2 weeks time. 6/13; patient presents for follow-up. She has been using bacitracin to the abrasion on the right great toe. She has been using collagen and Keystone antibiotic to the amputation site. She has no issues or complaints today. She denies signs of infection. 6/22; patient presents for follow-up. She states that her abdominal aortogram was canceled due to her renal function. She has been using Keystone antibiotics to the amputation site and Medihoney to the right great toe wound. At the pace of the right great toe she has a slitlike open area that she thinks was caused by the tape from the dressing. 6/29; patient presents for follow-up. She has been using Keystone antibiotics to the amputation site along with collagen. She has been using Medihoney to the right great toe wound. She has no other wounds. She denies signs of infection. 7/13; patient presents for follow-up. She has been using Keystone antibiotics and collagen to the wound sites. She currently denies signs of infection. She states she is scheduled to see me nephrology next month. 7/20; patient presents for follow-up. She continue Keystone antibiotics and collagen to the wound sites. She has no issues or complaints today. 8/1; patient presents for follow up. She continues to use keystone antibiotics and collagen to the wound sites. She has no issues or complaints today. 8/15; patient presents for follow-up. She has been using Keystone antibiotics and collagen to the wound sites. She followed up with her nephrologist who made medication changes. She is supposed to get a repeat BMP in 2 weeks. Her decrease in renal function was a limiting factor in obtaining an arteriogram for potential intervention for revascularization. She currently denies signs of infection. 9/2; patient presents for follow-up. She has been using Keystone antibiotic and collagen to the  wound beds. She has no issues or complaints today. Reports there has been improvement in kidney function however not cleared to have her arteriogram just yet. 10/10; patient presents for follow-up. She has been using Keystone antibiotic and collagen to the wound beds. She reports 2 new wounds 1 to the anterior right lower extremity and another to the plantar aspect of the right foot. She states that the right plantar foot wound was caused by the home health nurse changing the dressing and causing a skin tear. She is not sure how the right anterior leg wound started. It appears to be from trauma. She denies signs of infection. 10/19; patient presents for follow-up. She has been using Keystone antibiotic and collagen to the right great toe wound. She is been using silver alginate to the right anterior and right plantar foot wound. She has been using Tubigrip to the right lower extremity. The plantar foot wound has healed. She has no issues or complaints today. 11/3; since the patient was last here she was seen in urgent care apparently for an area on the dorsal aspect of the right fifth toe perhaps over the PIP. I saw a picture of this on the daughter's cell phone. There was slough on this. Urgent care gave him doxycycline. She is also changed the dressing to all wounds back the [facility] and collagen which includes her right leg and left first toe Electronic Signature(s) Signed: 07/11/2022 12:29:37 PM By: Megan Ham MD Entered By: [name] on 07/11/2022 10:46:06 -------------------------------------------------------------------------------- Physical Exam Details Patient Name: Date of Service: Megan Bogus. 07/11/2022 9:45 A [name] Medical Record Number: [id number] Patient Account Number: [id number] Date of Birth/Sex: Treating RN: [birth year] (88 y.o. F) Primary Care Provider:  Jorge NyHayes, Romana Other Clinician: Referring Provider: Treating Provider/Extender: Davy Piqueobson, Kanyon Seibold Hayes, Lester Carolinaomana Weeks  in Treatment: 23 Constitutional Sitting or standing Blood Pressure is within target range for patient.. Pulse regular and within target range for patient.[name] Kitchen. Respirations regular, non-labored and within target range.. Temperature is normal and within the target range for the patient.[name] Kitchen. Appears in no distress. Notes Wound exam; she has a small wound on the anterior right lower extremity this is in the setting of significant lymphedema with skin changes of stasis dermatitis. This has pale granulation. No evidence of infection. There is nothing open on the dorsal right fifth toe and no evidence of infection Medial aspect of the left first toe eschar and dry skin removed from the edges this is still open but a very tiny open area Electronic Signature(s) Signed: 07/11/2022 12:29:37 PM By: Baltazar Najjarobson, Tashawna Thom MD Allinson, Ginette PitmanHATTIE [name] ([id number]) [id number] Page 3 of 8 Entered By: Baltazar Najjarobson, Markale Birdsell on 07/11/2022 10:47:14 -------------------------------------------------------------------------------- Physician Orders Details Patient Name: Date of Service: [name], HA TTIE [name]. 07/11/2022 9:45 A [name] Medical Record Number: [id number] Patient Account Number: [id number] Date of Birth/Sex: Treating RN: 03/14/1928 (88 y.o. Debara PickettF) Deaton, Yvonne KendallBobbi Primary Care Provider: Jorge NyHayes, Romana Other Clinician: Referring Provider: Treating Provider/Extender: Marnette Burgessobson, Zandra Lajeunesse Hayes, Romana Weeks in Treatment: 23 Verbal / Phone Orders: No Diagnosis Coding ICD-10 Coding Code Description L97.512 Non-pressure chronic ulcer of other part of right foot with fat layer exposed G90.09 Other idiopathic peripheral autonomic neuropathy M86.9 Osteomyelitis, unspecified I48.91 Unspecified atrial fibrillation Z79.01 Long term (current) use of anticoagulants I89.0 Lymphedema, not elsewhere classified I87.2 Venous insufficiency (chronic) (peripheral) J44.9 Chronic obstructive pulmonary disease, unspecified I73.9 Peripheral  vascular disease, unspecified S81.801A Unspecified open wound, right lower leg, initial encounter Follow-up Appointments Return appointment in 3 weeks. - with Dr. Mikey BussingHoffman Other: - [facility]. Anesthetic (In clinic) Topical Lidocaine 5% applied to wound bed Edema Control - Lymphedema / SCD / Other Elevate legs to the level of the heart or above for 30 minutes daily and/or when sitting, a frequency of: - throughout the day. Avoid standing for long periods of time. Moisturize legs daily. - every night before bed. Other Edema Control Orders/Instructions: - tubigrip size E to right leg apply in the morning and remove at night. Off-Loading Open toe surgical shoe to: - while sitting and standing. Home Health New wound care orders this week; continue Home Health for wound care. May utilize formulary equivalent dressing for wound treatment orders unless otherwise specified. - Home health weekly dressing changes. keystone and collagen to wounds change every day. Other Home Health Orders/Instructions: - [facility]. Wound Treatment Wound #5 - T Great oe Wound Laterality: Right Cleanser: Soap and Water (Home Health) 1 x Per Day/30 Days Discharge Instructions: May shower and wash wound with dial antibacterial soap and water prior to dressing change. Cleanser: Wound Cleanser (Home Health) 1 x Per Day/30 Days Discharge Instructions: Cleanse the wound with wound cleanser prior to applying a clean dressing using gauze sponges, not tissue or cotton balls. Topical: Topical antibiotics 1 x Per Day/30 Days Discharge Instructions: apply directly to wound bed. Prim Dressing: FIBRACOL Plus Dressing, 2x2 in (collagen) 1 x Per Day/30 Days ary Discharge Instructions: Moisten collagen with saline or hydrogel Secondary Dressing: Woven Gauze Sponges 2x2 in (Home Health) 1 x Per Day/30 Days Discharge Instructions: Apply over primary dressing. may tape down with medipore  tape. Secured With: Insurance underwriterConforming Stretch Gauze Bandage, Sterile 2x75 (in/in) (Home Health) 1 x Per Day/30 Days Discharge Instructions: Secure  with stretch gauze or use paper tape to secure the gauze. TAHINA, MCKANE ([id number]) [id number] Page 4 of 8 Secured With: Paper Tape, 2x10 (in/yd) (Home Health) 1 x Per Day/30 Days Discharge Instructions: Secure dressing with tape as directed. Wound #6 - Lower Leg Wound Laterality: Right, Anterior Cleanser: Soap and Water (Home Health) 1 x Per Day/30 Days Discharge Instructions: May shower and wash wound with dial antibacterial soap and water prior to dressing change. Cleanser: Wound Cleanser (Home Health) 1 x Per Day/30 Days Discharge Instructions: Cleanse the wound with wound cleanser prior to applying a clean dressing using gauze sponges, not tissue or cotton balls. Topical: Topical antibiotics 1 x Per Day/30 Days Discharge Instructions: apply directly to wound bed. Prim Dressing: FIBRACOL Plus Dressing, 2x2 in (collagen) 1 x Per Day/30 Days ary Discharge Instructions: Moisten collagen with saline or hydrogel Secondary Dressing: Woven Gauze Sponges 2x2 in (Home Health) 1 x Per Day/30 Days Discharge Instructions: Apply over primary dressing. may tape down with medipore tape. Secured With: Paper Tape, 2x10 (in/yd) (Home Health) 1 x Per Day/30 Days Discharge Instructions: Secure dressing with tape as directed. Electronic Signature(s) Signed: 07/11/2022 12:29:37 PM By: Megan Ham MD Signed: 07/11/2022 5:25:29 PM By: Deon Pilling RN, BSN Entered By: Deon Pilling on 07/11/2022 10:42:08 -------------------------------------------------------------------------------- Problem List Details Patient Name: Date of Service: Megan Bogus. 07/11/2022 9:45 A [name] Medical Record Number: [id number] Patient Account Number: [id number] Date of Birth/Sex: Treating RN: [birth year] (88 y.o. [name] Primary Care Provider: Windy Carina Other Clinician: Referring Provider: Treating Provider/Extender: Harlon Ditty, Radene Journey in Treatment: 23 Active Problems ICD-10 Encounter Code Description Active Date MDM Diagnosis L97.512 Non-pressure chronic ulcer of other part of right foot with fat layer exposed 01/28/2022 No Yes G90.09 Other idiopathic peripheral autonomic neuropathy 01/28/2022 No Yes M86.9 Osteomyelitis, unspecified 01/28/2022 No Yes I48.91 Unspecified atrial fibrillation 01/28/2022 No Yes S81.801A Unspecified open wound, right lower leg, initial encounter 06/17/2022 No Yes Z79.01 Long term (current) use of anticoagulants 01/28/2022 No Yes Carlyon, Jo-Anne [name] ([id number]) [id number] Page 5 of 8 I89.0 Lymphedema, not elsewhere classified 01/28/2022 No Yes I87.2 Venous insufficiency (chronic) (peripheral) 01/28/2022 No Yes J44.9 Chronic obstructive pulmonary disease, unspecified 01/28/2022 No Yes I73.9 Peripheral vascular disease, unspecified 03/27/2022 No Yes Inactive Problems ICD-10 Code Description Active Date Inactive Date L97.812 Non-pressure chronic ulcer of other part of right lower leg with fat layer exposed 01/28/2022 01/28/2022 Resolved Problems ICD-10 Code Description Active Date Resolved Date L97.822 Non-pressure chronic ulcer of other part of left lower leg with fat layer exposed 01/28/2022 01/28/2022 Electronic Signature(s) Signed: 07/11/2022 12:29:37 PM By: Megan Ham MD Entered By: [name] on 07/11/2022 10:44:41 -------------------------------------------------------------------------------- Progress Note Details Patient Name: Date of Service: Megan Bogus. 07/11/2022 9:45 A [name] Medical Record Number: [id number] Patient Account Number: [id number] Date of Birth/Sex: Treating RN: 02/08/1928 (88 y.o. F) Primary Care Provider: Windy Carina Other Clinician: Referring Provider: Treating Provider/Extender: Harlon Ditty, Radene Journey in Treatment:  23 Subjective History of Present Illness (HPI) Admission 01/28/2022 Ms. Minha Awwad is a 88 year old female with a past medical history of idiopathic peripheral neuropathy status post amputation to the second right toe secondary to osteomyelitis, COPD and A-fib on Coumadin the presents to the clinic for a 59-month history of nonhealing ulcer to a previous amputation site on the second right toe. She states she has tried Medihoney and silver alginate in the past to this area with little benefit. She also has 2 small areas limited to  skin breakdown to her lower extremities bilaterally. She has chronic venous insufficiency but not has not been wearing her compression stockings. She states she bumped her legs against an object and not so the wound started. She has been using Medihoney to the sites. She denies signs of infection. 6/2; patient presents for follow-up. She had an x-ray of her right foot done at last clinic visit and this was negative for evidence of osteomyelitis. She also had a wound culture done that showed extra high levels of Staph aureus. I recommended Keystone antibiotics for this and this was ordered. She had ABIs with TBI's done as well that showed monophasic waveforms to the right foot with TBI of 0 and an ABI of 0.52. Urgent referral was made to vein and vascular and she saw Dr. Doren [name] on 6/1, yesterday and he recommended an arteriogram. This is scheduled for 6/16. Patient also reports a new wound to the right great toe. This is a blister that has ruptured. She also reports increased erythema to the toe. 6/6; the patient was worked in urgently today at the insistence of her daughter out of concern for a new wound on the lateral part of the plantar right great toe. She has her original postsurgical wound after the amputation of the right second toe, she has a wound on the medial part of the right great toe. The patient is apparently going for an angiogram by Dr. Doren [name] in 2 weeks  time. 6/13; patient presents for follow-up. She has been using bacitracin to the abrasion on the right great toe. She has been using collagen and Keystone antibiotic Megan, [name] ([id number]) [id number] Page 6 of 8 to the amputation site. She has no issues or complaints today. She denies signs of infection. 6/22; patient presents for follow-up. She states that her abdominal aortogram was canceled due to her renal function. She has been using Keystone antibiotics to the amputation site and Medihoney to the right great toe wound. At the pace of the right great toe she has a slitlike open area that she thinks was caused by the tape from the dressing. 6/29; patient presents for follow-up. She has been using Keystone antibiotics to the amputation site along with collagen. She has been using Medihoney to the right great toe wound. She has no other wounds. She denies signs of infection. 7/13; patient presents for follow-up. She has been using Keystone antibiotics and collagen to the wound sites. She currently denies signs of infection. She states she is scheduled to see me nephrology next month. 7/20; patient presents for follow-up. She continue Keystone antibiotics and collagen to the wound sites. She has no issues or complaints today. 8/1; patient presents for follow up. She continues to use keystone antibiotics and collagen to the wound sites. She has no issues or complaints today. 8/15; patient presents for follow-up. She has been using Keystone antibiotics and collagen to the wound sites. She followed up with her nephrologist who made medication changes. She is supposed to get a repeat BMP in 2 weeks. Her decrease in renal function was a limiting factor in obtaining an arteriogram for potential intervention for revascularization. She currently denies signs of infection. 9/2; patient presents for follow-up. She has been using Keystone antibiotic and collagen to the wound  beds. She has no issues or complaints today. Reports there has been improvement in kidney function however not cleared to have her arteriogram just yet. 10/10; patient presents for follow-up. She has been using Keystone antibiotic and collagen  to the wound beds. She reports 2 new wounds 1 to the anterior right lower extremity and another to the plantar aspect of the right foot. She states that the right plantar foot wound was caused by the home health nurse changing the dressing and causing a skin tear. She is not sure how the right anterior leg wound started. It appears to be from trauma. She denies signs of infection. 10/19; patient presents for follow-up. She has been using Keystone antibiotic and collagen to the right great toe wound. She is been using silver alginate to the right anterior and right plantar foot wound. She has been using Tubigrip to the right lower extremity. The plantar foot wound has healed. She has no issues or complaints today. 11/3; since the patient was last here she was seen in urgent care apparently for an area on the dorsal aspect of the right fifth toe perhaps over the PIP. I saw a picture of this on the daughter's cell phone. There was slough on this. Urgent care gave him doxycycline. She is also changed the dressing to all wounds back the [facility] and collagen which includes her right leg and left first toe Objective Constitutional Sitting or standing Blood Pressure is within target range for patient.. Pulse regular and within target range for patient.[name] Kitchen Respirations regular, non-labored and within target range.. Temperature is normal and within the target range for the patient.[name] Kitchen Appears in no distress. Vitals Time Taken: 10:02 AM, Height: 62 in, Weight: 181 lbs, BMI: 33.1, Temperature: 98 F, Pulse: 65 bpm, Respiratory Rate: 18 breaths/min, Blood Pressure: 110/64 mmHg. General Notes: Wound exam; she has a small wound on the anterior right lower extremity this is in  the setting of significant lymphedema with skin changes of stasis dermatitis. This has pale granulation. No evidence of infection. oo There is nothing open on the dorsal right fifth toe and no evidence of infection oo Medial aspect of the left first toe eschar and dry skin removed from the edges this is still open but a very tiny open area Integumentary (Hair, Skin) Wound #5 status is Open. Original cause of wound was Shear/Friction. The date acquired was: 02/06/2022. The wound has been in treatment 22 weeks. The wound is located on the Right T Great. The wound measures 0.1cm length x 0.1cm width x 0.1cm depth; 0.008cm^2 area and 0.001cm^3 volume. There is Fat oe Layer (Subcutaneous Tissue) exposed. There is no tunneling or undermining noted. There is a none present amount of drainage noted. The wound margin is distinct with the outline attached to the wound base. There is no granulation within the wound bed. There is no necrotic tissue within the wound bed. The periwound skin appearance exhibited: Callus, Dry/Scaly. The periwound skin appearance did not exhibit: Crepitus, Excoriation, Induration, Rash, Scarring, Maceration, Atrophie Blanche, Cyanosis, Ecchymosis, Hemosiderin Staining, Mottled, Pallor, Rubor, Erythema. Wound #6 status is Open. Original cause of wound was Bump. The date acquired was: 06/16/2022. The wound has been in treatment 3 weeks. The wound is located on the Right,Anterior Lower Leg. The wound measures 0.6cm length x 0.5cm width x 0.1cm depth; 0.236cm^2 area and 0.024cm^3 volume. There is Fat Layer (Subcutaneous Tissue) exposed. There is no tunneling or undermining noted. There is a medium amount of serosanguineous drainage noted. The wound margin is distinct with the outline attached to the wound base. There is no granulation within the wound bed. There is a large (67-100%) amount of necrotic tissue within the wound bed including Adherent Slough. The periwound skin  appearance did  not exhibit: Callus, Crepitus, Excoriation, Induration, Rash, Scarring, Dry/Scaly, Maceration, Atrophie Blanche, Cyanosis, Ecchymosis, Hemosiderin Staining, Mottled, Pallor, Rubor, Erythema. 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 idiopathic peripheral autonomic neuropathy Osteomyelitis, unspecified Unspecified atrial fibrillation Unspecified open wound, right lower leg, initial encounter Long term (current) use of anticoagulants Lymphedema, not elsewhere classified Venous insufficiency (chronic) (peripheral) Chronic obstructive pulmonary disease, unspecified Belgrave, Nysha [name] ([id number]) [id number] Page 7 of 8 Peripheral vascular disease, unspecified Procedures Wound #5 Pre-procedure diagnosis of Wound #5 is an Abrasion located on the Right T Great . There was a Selective/Open Wound Non-Viable Tissue Debridement with a oe total area of 0.09 sq cm performed by [name]., MD. With the following instrument(s): Curette to remove Non-Viable tissue/material. Material removed includes Eschar and Callus and after achieving pain control using Lidocaine 5% topical ointment. A time out was conducted at 10:30, prior to the start of the procedure. There was no bleeding. The procedure was tolerated well. Post Debridement Measurements: 0.2cm length x 0.2cm width x 0.2cm depth; 0.006cm^3 volume. Character of Wound/Ulcer Post Debridement is improved. Post procedure Diagnosis Wound #5: Same as Pre-Procedure Plan Follow-up Appointments: Return appointment in 3 weeks. - with Dr. Heber [place] Other: - Will call [place] for refill. Anesthetic: (In clinic) Topical Lidocaine 5% applied to wound bed Edema Control - Lymphedema / SCD / Other: Elevate legs to the level of the heart or above for 30 minutes daily and/or when sitting, a frequency of: - throughout the day. Avoid standing for long  periods of time. Moisturize legs daily. - every night before bed. Other Edema Control Orders/Instructions: - tubigrip size E to right leg apply in the morning and remove at night. Off-Loading: Open toe surgical shoe to: - while sitting and standing. Home Health: New wound care orders this week; continue Home Health for wound care. May utilize formulary equivalent dressing for wound treatment orders unless otherwise specified. - Home health weekly dressing changes. keystone and collagen to wounds change every day. Other Home Health Orders/Instructions: - [place]. WOUND #5: - T Great Wound Laterality: Right oe Cleanser: Soap and Water (Home Health) 1 x Per Day/30 Days Discharge Instructions: May shower and wash wound with dial antibacterial soap and water prior to dressing change. Cleanser: Wound Cleanser (Home Health) 1 x Per Day/30 Days Discharge Instructions: Cleanse the wound with wound cleanser prior to applying a clean dressing using gauze sponges, not tissue or cotton balls. Topical: Topical antibiotics 1 x Per Day/30 Days Discharge Instructions: apply directly to wound bed. Prim Dressing: FIBRACOL Plus Dressing, 2x2 in (collagen) 1 x Per Day/30 Days ary Discharge Instructions: Moisten collagen with saline or hydrogel Secondary Dressing: Woven Gauze Sponges 2x2 in (Home Health) 1 x Per Day/30 Days Discharge Instructions: Apply over primary dressing. may tape down with medipore tape. Secured With: Child psychotherapist, Sterile 2x75 (in/in) (Home Health) 1 x Per Day/30 Days Discharge Instructions: Secure with stretch gauze or use paper tape to secure the gauze. Secured With: Paper T ape, 2x10 (in/yd) ([place]) 1 x Per Day/30 Days Discharge Instructions: Secure dressing with tape as directed. WOUND #6: - Lower Leg Wound Laterality: Right, Anterior Cleanser: Soap and Water (Home Health) 1 x Per Day/30 Days Discharge Instructions: May shower and wash wound  with dial antibacterial soap and water prior to dressing change. Cleanser: Wound Cleanser (Home Health) 1 x Per Day/30 Days Discharge Instructions: Cleanse the wound  with wound cleanser prior to applying a clean dressing using gauze sponges, not tissue or cotton balls. Topical: Topical antibiotics 1 x Per Day/30 Days Discharge Instructions: apply directly to wound bed. Prim Dressing: FIBRACOL Plus Dressing, 2x2 in (collagen) 1 x Per Day/30 Days ary Discharge Instructions: Moisten collagen with saline or hydrogel Secondary Dressing: Woven Gauze Sponges 2x2 in (Home Health) 1 x Per Day/30 Days Discharge Instructions: Apply over primary dressing. may tape down with medipore tape. Secured With: Paper T ape, 2x10 (in/yd) ([place]) 1 x Per Day/30 Days Discharge Instructions: Secure dressing with tape as directed. I was not going challenge the [facility] and collagen changed with the patient's daughter 2. There was nothing open on the right fifth toe today. I am not exactly sure what they were looking at an urgent care. I saw no evidence of infection today 3. The patient has PAD but apparently significant chronic renal failure currently being evaluated by nephrology. She does not appear to have critical limb ischemia by definition although I do not mean to minimize the risk of significant skin breakdown because of the PAD and/or the lymphedema.In the absence of symptoms and the small wounds I be surprised if vascular would be interested in doing a CO2 angiogram Electronic Signature(s) Signed: 07/11/2022 12:29:37 PM By: Megan Ham MD Entered By: [name] on 07/11/2022 10:49:50 [name], Megan Pearson ([id number]) [id number] Page 8 of 8 -------------------------------------------------------------------------------- SuperBill Details Patient Name: Date of Service: KINLEIGH, NAULT 07/11/2022 Medical Record Number: [id number] Patient Account Number: [id number] Date of  Birth/Sex: Treating RN: 25-May-1928 (88 y.o. [name], Tammi Klippel Primary Care Provider: Windy Carina Other Clinician: Referring Provider: Treating Provider/Extender: Harlon Ditty, Radene Journey in Treatment: 23 Diagnosis Coding ICD-10 Codes Code Description [phone] Non-pressure chronic ulcer of other part of right foot with fat layer exposed G90.09 Other idiopathic peripheral autonomic neuropathy M86.9 Osteomyelitis, unspecified I48.91 Unspecified atrial fibrillation Z79.01 Long term (current) use of anticoagulants I89.0 Lymphedema, not elsewhere classified I87.2 Venous insufficiency (chronic) (peripheral) J44.9 Chronic obstructive pulmonary disease, unspecified I73.9 Peripheral vascular disease, unspecified S81.801A Unspecified open wound, right lower leg, initial encounter Facility Procedures : CPT4 Code: [id number] Description: 42876 - DEBRIDE WOUND 1ST 20 SQ CM OR < ICD-10 Diagnosis Description L97.512 Non-pressure chronic ulcer of other part of right foot with fat layer exposed Modifier: Quantity: 1 Physician Procedures : CPT4 Code Description Modifier [id number] - WC PHYS DEBR WO ANESTH 20 SQ CM ICD-10 Diagnosis Description L97.512 Non-pressure chronic ulcer of other part of right foot with fat layer exposed Quantity: 1 Electronic Signature(s) Signed: 07/11/2022 12:29:37 PM By: Megan Ham MD Entered By: [name] on 07/11/2022 10:49:59

## 2022-07-17 ENCOUNTER — Encounter (HOSPITAL_BASED_OUTPATIENT_CLINIC_OR_DEPARTMENT_OTHER): Payer: Medicare HMO | Admitting: Internal Medicine

## 2022-07-18 ENCOUNTER — Other Ambulatory Visit (HOSPITAL_BASED_OUTPATIENT_CLINIC_OR_DEPARTMENT_OTHER): Payer: Self-pay | Admitting: Family

## 2022-07-18 DIAGNOSIS — I5032 Chronic diastolic (congestive) heart failure: Secondary | ICD-10-CM

## 2022-07-18 DIAGNOSIS — I272 Pulmonary hypertension, unspecified: Secondary | ICD-10-CM

## 2022-07-18 NOTE — Telephone Encounter (Signed)
Rx request sent to pharmacy.  

## 2022-07-24 ENCOUNTER — Encounter (HOSPITAL_BASED_OUTPATIENT_CLINIC_OR_DEPARTMENT_OTHER): Payer: Medicare HMO | Admitting: Internal Medicine

## 2022-07-24 DIAGNOSIS — L97512 Non-pressure chronic ulcer of other part of right foot with fat layer exposed: Secondary | ICD-10-CM | POA: Diagnosis not present

## 2022-07-24 DIAGNOSIS — S81802A Unspecified open wound, left lower leg, initial encounter: Secondary | ICD-10-CM

## 2022-07-24 DIAGNOSIS — I872 Venous insufficiency (chronic) (peripheral): Secondary | ICD-10-CM | POA: Diagnosis not present

## 2022-07-24 DIAGNOSIS — T798XXA Other early complications of trauma, initial encounter: Secondary | ICD-10-CM | POA: Diagnosis not present

## 2022-07-25 NOTE — Progress Notes (Signed)
MELISA, DONOFRIO ([id number]) [id number] Page 1 of 10 Visit Report for 07/24/2022 Chief Complaint Document Details Patient Name: Date of Service: [name], [name]. 07/24/2022 2:00 PM Medical Record Number: [id number] Patient Account Number: [id number] Date of Birth/Sex: Treating RN: [name], 1929 (88 y.o. F) Primary Care Provider: Jorge [name] Other Clinician: Referring Provider: Treating Provider/Extender: [name] Weeks in Treatment: 25 Information Obtained from: Patient Chief Complaint 01/28/2022; right second toe amputation site dehiscence and bilateral lower extremity wounds. Electronic Signature(s) Signed: 07/24/2022 5:05:23 PM By: Megan Corwin DO Entered By: [name] on 07/24/2022 16:16:54 -------------------------------------------------------------------------------- Debridement Details Patient Name: Date of Service: [name]. 07/24/2022 2:00 PM Medical Record Number: [id number] Patient Account Number: [id number] Date of Birth/Sex: Treating RN: 10/21/1927 (88 y.o. [name], [name] Primary Care Provider: Jorge [name] Other Clinician: Referring Provider: Treating Provider/Extender: [name] Weeks in Treatment: 25 Debridement Performed for Assessment: Wound #8 Left Knee Performed By: Physician Megan Corwin, DO Debridement Type: Debridement Level of Consciousness (Pre-procedure): Awake and Alert Pre-procedure Verification/Time Out Yes - 15:45 Taken: Start Time: 15:45 Pain Control: Lidocaine T Area Debrided (L x W): otal 2 (cm) x 1.3 (cm) = 2.6 (cm) Tissue and other material debrided: Viable, Non-Viable, Eschar, Slough, Subcutaneous, Slough Level: Skin/Subcutaneous Tissue Debridement Description: Excisional Instrument: Curette Bleeding: Minimum Hemostasis Achieved: Pressure End Time: 15:45 Procedural Pain: 0 Post Procedural Pain: 0 Response to Treatment: Procedure was  tolerated well Level of Consciousness (Post- Awake and Alert procedure): Post Debridement Measurements of Total Wound Length: (cm) 2 Width: (cm) 1.3 Depth: (cm) 0.1 Volume: (cm) 0.204 Character of Wound/Ulcer Post Debridement: Improved Post Procedure Diagnosis [name], [name] ([id number]) [id number] Page 2 of 10 Same as Pre-procedure Electronic Signature(s) Signed: 07/24/2022 5:05:23 PM By: Megan Corwin DO Signed: 07/25/2022 12:09:11 PM By: Megan Mu RN Entered By: [name] on 07/24/2022 16:30:58 -------------------------------------------------------------------------------- HPI Details Patient Name: Date of Service: [name]. 07/24/2022 2:00 PM Medical Record Number: [id number] Patient Account Number: [id number] Date of Birth/Sex: Treating RN: 10-23-1927 (88 y.o. F) Primary Care Provider: Jorge [name] Other Clinician: Referring Provider: Treating Provider/Extender: [name] Weeks in Treatment: 25 History of Present Illness HPI Description: Admission 01/28/2022 [name] is a 88 year old female with a past medical history of idiopathic peripheral neuropathy status post amputation to the second right toe secondary to osteomyelitis, COPD and A-fib on Coumadin the presents to the clinic for a 74-month history of nonhealing ulcer to a previous amputation site on the second right toe. She states she has tried Medihoney and silver alginate in the past to this area with little benefit. She also has 2 small areas limited to skin breakdown to her lower extremities bilaterally. She has chronic venous insufficiency but not has not been wearing her compression stockings. She states she bumped her legs against an object and not so the wound started. She has been using Medihoney to the sites. She denies signs of infection. 6/2; patient presents for follow-up. She had an x-ray of her right foot done at last clinic  visit and this was negative for evidence of osteomyelitis. She also had a wound culture done that showed extra high levels of [name]. I recommended [name] antibiotics for this and this was ordered. She had ABIs with TBI's done as well that showed monophasic waveforms to the right foot with TBI of 0 and an ABI of 0.52. Urgent referral was made to vein and vascular and she saw [name] on 6/1, yesterday  and he recommended an arteriogram. This is scheduled for 6/16. Patient also reports a new wound to the right great toe. This is a blister that has ruptured. She also reports increased erythema to the toe. 6/6; the patient was worked in urgently today at the insistence of her daughter out of concern for a new wound on the lateral part of the plantar right great toe. She has her original postsurgical wound after the amputation of the right second toe, she has a wound on the medial part of the right great toe. The patient is apparently going for an angiogram by [name] in 2 weeks time. 6/13; patient presents for follow-up. She has been using bacitracin to the abrasion on the right great toe. She has been using collagen and [name] antibiotic to the amputation site. She has no issues or complaints today. She denies signs of infection. 6/22; patient presents for follow-up. She states that her abdominal aortogram was canceled due to her renal function. She has been using [name] antibiotics to the amputation site and Medihoney to the right great toe wound. At the pace of the right great toe she has a slitlike open area that she thinks was caused by the tape from the dressing. 6/29; patient presents for follow-up. She has been using [name] antibiotics to the amputation site along with collagen. She has been using Medihoney to the right great toe wound. She has no other wounds. She denies signs of infection. 7/13; patient presents for follow-up. She has been using [name] antibiotics and collagen  to the wound sites. She currently denies signs of infection. She states she is scheduled to see me nephrology next month. 7/20; patient presents for follow-up. She continue [name] antibiotics and collagen to the wound sites. She has no issues or complaints today. 8/1; patient presents for follow up. She continues to use [name] antibiotics and collagen to the wound sites. She has no issues or complaints today. 8/15; patient presents for follow-up. She has been using [name] antibiotics and collagen to the wound sites. She followed up with her nephrologist who made medication changes. She is supposed to get a repeat BMP in 2 weeks. Her decrease in renal function was a limiting factor in obtaining an arteriogram for potential intervention for revascularization. She currently denies signs of infection. 9/2; patient presents for follow-up. She has been using [name] antibiotic and collagen to the wound beds. She has no issues or complaints today. Reports there has been improvement in kidney function however not cleared to have her arteriogram just yet. 10/10; patient presents for follow-up. She has been using [name] antibiotic and collagen to the wound beds. She reports 2 new wounds 1 to the anterior right lower extremity and another to the plantar aspect of the right foot. She states that the right plantar foot wound was caused by the home health nurse changing the dressing and causing a skin tear. She is not sure how the right anterior leg wound started. It appears to be from trauma. She denies signs of infection. 10/19; patient presents for follow-up. She has been using [name] antibiotic and collagen to the right great toe wound. She is been using silver alginate to the right anterior and right plantar foot wound. She has been using Tubigrip to the right lower extremity. The plantar foot wound has healed. She has no issues or complaints today. 11/3; since the patient was last here she was seen  in urgent care apparently for an area on the dorsal aspect of  the right fifth toe perhaps over the PIP. I saw a picture of this on the daughter's cell phone. There was slough on this. Urgent care gave him doxycycline. She is also changed the dressing to all wounds back the [facility] and collagen which includes her right leg and left first toe 11/16; patient presents for follow-up. She has a new wound to the left knee. She states she fell. She has been using antibiotic ointment and collagen to this area. She has been using collagen and [name] antibiotic to the right great toe wound. The anterior right leg wound is healed. She denies signs of infection. [name], [name] ([id number]) [id number] Page 3 of 10 Electronic Signature(s) Signed: 07/24/2022 5:05:23 PM By: Megan Corwin DO Entered By: [name] on 07/24/2022 16:18:56 -------------------------------------------------------------------------------- Physical Exam Details Patient Name: Date of Service: Grine, [name]. 07/24/2022 2:00 PM Medical Record Number: [id number] Patient Account Number: [id number] Date of Birth/Sex: Treating RN: [birth year] (89 y.o. F) Primary Care Provider: Jorge [name] Other Clinician: Referring Provider: Treating Provider/Extender: [name] Weeks in Treatment: 25 Constitutional respirations regular, non-labored and within target range for patient.. Cardiovascular 2+ dorsalis pedis/posterior tibialis pulses. Psychiatric pleasant and cooperative. Notes T the anterior right lower extremity there is a thin hardened scab. There is no bogginess an area appears well-healed. T the right great toe there is a small open o o wound with granulation tissue. No drainage noted. T the anterior left leg over the knee there is an open wound with nonviable dried fluid. Postdebridement there is granulation tissue. No signs of infection to any o of the wound  beds. Electronic Signature(s) Signed: 07/24/2022 5:05:23 PM By: Megan Corwin DO Entered By: [name] on 07/24/2022 16:26:29 -------------------------------------------------------------------------------- Physician Orders Details Patient Name: Date of Service: Balthazar, Megan TTIE [name]. 07/24/2022 2:00 PM Medical Record Number: [id number] Patient Account Number: [id number] Date of Birth/Sex: Treating RN: 11/21/1927 (88 y.o. Toniann Fail Primary Care Provider: Jorge [name] Other Clinician: Referring Provider: Treating Provider/Extender: [name] Weeks in Treatment: 25 Verbal / Phone Orders: No Diagnosis Coding Follow-up Appointments ppointment in 2 weeks. - w/ Dr. Mikey Bussing Return A Other: - Will call [facility] for refill. Anesthetic (In clinic) Topical Lidocaine 5% applied to wound bed Edema Control - Lymphedema / SCD / Other Elevate legs to the level of the heart or above for 30 minutes daily and/or when sitting, a frequency of: - throughout the day. Avoid standing for long periods of time. Moisturize legs daily. - every night before bed. Other Edema Control Orders/Instructions: - tubigrip size E to right leg apply in the morning and remove at night. Off-Loading Open toe surgical shoe to: - while sitting and standing. [name], [name] ([id number]) [id number] Page 4 of 10 Home Health New wound care orders this week; continue Home Health for wound care. Megan utilize formulary equivalent dressing for wound treatment orders unless otherwise specified. - Home health weekly dressing changes. [name] and collagen to wounds change every day. New wound- use medihoney and Hydraferablue to left knee. Other Home Health Orders/Instructions: - [facility]. Wound Treatment Wound #5 - T Great oe Wound Laterality: Right Cleanser: Soap and Water (Home Health) 1 x Per Day/30 Days Discharge Instructions: Megan shower and wash  wound with dial antibacterial soap and water prior to dressing change. Cleanser: Wound Cleanser (Home Health) 1 x Per Day/30 Days Discharge Instructions: Cleanse the wound with wound cleanser prior to applying a clean dressing using gauze sponges, not tissue or  cotton balls. Topical: [name] T opical antibiotics 1 x Per Day/30 Days Discharge Instructions: apply directly to wound bed. Prim Dressing: FIBRACOL Plus Dressing, 2x2 in (collagen) 1 x Per Day/30 Days ary Discharge Instructions: Moisten collagen with saline or hydrogel Secondary Dressing: Woven Gauze Sponges 2x2 in (Home Health) 1 x Per Day/30 Days Discharge Instructions: Apply over primary dressing. Megan tape down with medipore tape. Secured With: Insurance underwriter, Sterile 2x75 (in/in) (Home Health) 1 x Per Day/30 Days Discharge Instructions: Secure with stretch gauze or use paper tape to secure the gauze. Secured With: Paper Tape, 2x10 (in/yd) (Home Health) 1 x Per Day/30 Days Discharge Instructions: Secure dressing with tape as directed. Compression Wrap: tubi grips size E 1 x Per Day/30 Days Wound #8 - Knee Wound Laterality: Left Cleanser: Soap and Water (Home Health) 1 x Per Day/30 Days Discharge Instructions: Megan shower and wash wound with dial antibacterial soap and water prior to dressing change. Cleanser: Wound Cleanser (Home Health) 1 x Per Day/30 Days Discharge Instructions: Cleanse the wound with wound cleanser prior to applying a clean dressing using gauze sponges, not tissue or cotton balls. Prim Dressing: Hydrofera Blue Ready Foam, 4x5 in (Home Health) 1 x Per Day/30 Days ary Discharge Instructions: Apply to wound bed as instructed Prim Dressing: MediHoney Gel, tube 1.5 (oz) (Home Health) 1 x Per Day/30 Days ary Discharge Instructions: Apply to wound bed as instructed Secondary Dressing: Woven Gauze Sponges 2x2 in (Home Health) 1 x Per Day/30 Days Discharge Instructions: Apply over primary dressing.  Megan tape down with medipore tape. Secondary Dressing: Zetuvit Plus Silicone Border Dressing 4x4 (in/in) (Home Health) 1 x Per Day/30 Days Discharge Instructions: Apply silicone border over primary dressing as directed. Electronic Signature(s) Signed: 07/24/2022 5:05:23 PM By: Megan Corwin DO Entered By: [name] on 07/24/2022 16:26:39 -------------------------------------------------------------------------------- Problem List Details Patient Name: Date of Service: [name]. 07/24/2022 2:00 PM Medical Record Number: [id number] Patient Account Number: [id number] Date of Birth/Sex: Treating RN: May 11, 1928 (88 y.o. F) Primary Care Provider: Jorge [name] Other Clinician: Referring Provider: Treating Provider/Extender: [name] Weeks in Treatment: [address] [name] ([id number]) [id number] Page 5 of 10 ICD-10 Encounter Code Description Active Date MDM Diagnosis L97.512 Non-pressure chronic ulcer of other part of right foot with fat layer exposed 01/28/2022 No Yes G90.09 Other idiopathic peripheral autonomic neuropathy 01/28/2022 No Yes M86.9 Osteomyelitis, unspecified 01/28/2022 No Yes I48.91 Unspecified atrial fibrillation 01/28/2022 No Yes S81.801A Unspecified open wound, right lower leg, initial encounter 06/17/2022 No Yes Z79.01 Long term (current) use of anticoagulants 01/28/2022 No Yes I89.0 Lymphedema, not elsewhere classified 01/28/2022 No Yes I87.2 Venous insufficiency (chronic) (peripheral) 01/28/2022 No Yes J44.9 Chronic obstructive pulmonary disease, unspecified 01/28/2022 No Yes I73.9 Peripheral vascular disease, unspecified 03/27/2022 No Yes S81.802A Unspecified open wound, left lower leg, initial encounter 07/24/2022 No Yes T79.8XXA Other early complications of trauma, initial encounter 07/24/2022 No Yes Inactive Problems ICD-10 Code Description Active Date Inactive Date L97.812 Non-pressure  chronic ulcer of other part of right lower leg with fat layer exposed 01/28/2022 01/28/2022 Resolved Problems ICD-10 Code Description Active Date Resolved Date L97.822 Non-pressure chronic ulcer of other part of left lower leg with fat layer exposed 01/28/2022 01/28/2022 Electronic Signature(s) Signed: 07/24/2022 5:05:23 PM By: Megan Corwin DO Entered By: [name] on 07/24/2022 16:15:10 [name], [name] ([id number]) [id number] Page 6 of 10 -------------------------------------------------------------------------------- Progress Note Details Patient Name: Date of Service: [name], [name] 07/24/2022 2:00 PM Medical Record Number: [id number]  Patient Account Number: [id number] Date of Birth/Sex: Treating RN: 1928-01-10 (88 y.o. F) Primary Care Provider: Jorge [name] Other Clinician: Referring Provider: Treating Provider/Extender: [name] Weeks in Treatment: 25 Subjective Chief Complaint Information obtained from Patient 01/28/2022; right second toe amputation site dehiscence and bilateral lower extremity wounds. History of Present Illness (HPI) Admission 01/28/2022 Ms. Sorrel Cassetta is a 88 year old female with a past medical history of idiopathic peripheral neuropathy status post amputation to the second right toe secondary to osteomyelitis, COPD and A-fib on Coumadin the presents to the clinic for a 64-month history of nonhealing ulcer to a previous amputation site on the second right toe. She states she has tried Medihoney and silver alginate in the past to this area with little benefit. She also has 2 small areas limited to skin breakdown to her lower extremities bilaterally. She has chronic venous insufficiency but not has not been wearing her compression stockings. She states she bumped her legs against an object and not so the wound started. She has been using Medihoney to the sites. She denies signs of infection. 6/2; patient  presents for follow-up. She had an x-ray of her right foot done at last clinic visit and this was negative for evidence of osteomyelitis. She also had a wound culture done that showed extra high levels of [name]. I recommended [name] antibiotics for this and this was ordered. She had ABIs with TBI's done as well that showed monophasic waveforms to the right foot with TBI of 0 and an ABI of 0.52. Urgent referral was made to vein and vascular and she saw [name] on 6/1, yesterday and he recommended an arteriogram. This is scheduled for 6/16. Patient also reports a new wound to the right great toe. This is a blister that has ruptured. She also reports increased erythema to the toe. 6/6; the patient was worked in urgently today at the insistence of her daughter out of concern for a new wound on the lateral part of the plantar right great toe. She has her original postsurgical wound after the amputation of the right second toe, she has a wound on the medial part of the right great toe. The patient is apparently going for an angiogram by [name] in 2 weeks time. 6/13; patient presents for follow-up. She has been using bacitracin to the abrasion on the right great toe. She has been using collagen and [name] antibiotic to the amputation site. She has no issues or complaints today. She denies signs of infection. 6/22; patient presents for follow-up. She states that her abdominal aortogram was canceled due to her renal function. She has been using [name] antibiotics to the amputation site and Medihoney to the right great toe wound. At the pace of the right great toe she has a slitlike open area that she thinks was caused by the tape from the dressing. 6/29; patient presents for follow-up. She has been using [name] antibiotics to the amputation site along with collagen. She has been using Medihoney to the right great toe wound. She has no other wounds. She denies signs of infection. 7/13;  patient presents for follow-up. She has been using [name] antibiotics and collagen to the wound sites. She currently denies signs of infection. She states she is scheduled to see me nephrology next month. 7/20; patient presents for follow-up. She continue [name] antibiotics and collagen to the wound sites. She has no issues or complaints today. 8/1; patient presents for follow up. She continues to use [name] antibiotics  and collagen to the wound sites. She has no issues or complaints today. 8/15; patient presents for follow-up. She has been using [name] antibiotics and collagen to the wound sites. She followed up with her nephrologist who made medication changes. She is supposed to get a repeat BMP in 2 weeks. Her decrease in renal function was a limiting factor in obtaining an arteriogram for potential intervention for revascularization. She currently denies signs of infection. 9/2; patient presents for follow-up. She has been using [name] antibiotic and collagen to the wound beds. She has no issues or complaints today. Reports there has been improvement in kidney function however not cleared to have her arteriogram just yet. 10/10; patient presents for follow-up. She has been using [name] antibiotic and collagen to the wound beds. She reports 2 new wounds 1 to the anterior right lower extremity and another to the plantar aspect of the right foot. She states that the right plantar foot wound was caused by the home health nurse changing the dressing and causing a skin tear. She is not sure how the right anterior leg wound started. It appears to be from trauma. She denies signs of infection. 10/19; patient presents for follow-up. She has been using [name] antibiotic and collagen to the right great toe wound. She is been using silver alginate to the right anterior and right plantar foot wound. She has been using Tubigrip to the right lower extremity. The plantar foot wound has healed. She  has no issues or complaints today. 11/3; since the patient was last here she was seen in urgent care apparently for an area on the dorsal aspect of the right fifth toe perhaps over the PIP. I saw a picture of this on the daughter's cell phone. There was slough on this. Urgent care gave him doxycycline. She is also changed the dressing to all wounds back the [facility] and collagen which includes her right leg and left first toe 11/16; patient presents for follow-up. She has a new wound to the left knee. She states she fell. She has been using antibiotic ointment and collagen to this area. She has been using collagen and [name] antibiotic to the right great toe wound. The anterior right leg wound is healed. She denies signs of infection. Patient History Information obtained from Patient, Caregiver. Family History Cancer - Siblings, Heart Disease, Stroke - Mother, No family history of Diabetes. [name], [name] ([id number]) [id number] Page 7 of 10 Social History Former smoker - quit 2003, Marital Status - Widowed, Alcohol Use - Never, Drug Use - No History, Caffeine Use - Never. Medical History Eyes Patient has history of Cataracts Hematologic/Lymphatic Patient has history of Anemia Respiratory Denies history of Chronic Obstructive Pulmonary Disease (COPD) Cardiovascular Patient has history of Congestive Heart Failure, Hypotension, Peripheral Venous Disease Musculoskeletal Patient has history of 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 Neurologic Patient has history of Neuropathy Medical A Surgical History Notes nd Hematologic/Lymphatic Thrombocytopenia Endocrine Hyperthyroidism, Hypothyroidism Objective Constitutional respirations regular, non-labored and within target range for patient.. Vitals Time Taken: 3:32 PM, Height: 62 in, Weight: 181 lbs, BMI: 33.1, Temperature: 98 F, Pulse: 54 bpm, Respiratory Rate: 18 breaths/min, Blood  Pressure: 176/67 mmHg. Cardiovascular 2+ dorsalis pedis/posterior tibialis pulses. Psychiatric pleasant and cooperative. General Notes: T the anterior right lower extremity there is a thin hardened scab. There is no bogginess an area appears well-healed. T the right great toe there o o is a small open wound with granulation tissue. No drainage noted. T the anterior left  leg over the knee there is an open wound with nonviable dried fluid. o Postdebridement there is granulation tissue. No signs of infection to any of the wound beds. Integumentary (Hair, Skin) Wound #5 status is Open. Original cause of wound was Shear/Friction. The date acquired was: 02/06/2022. The wound has been in treatment 23 weeks. The wound is located on the Right T Great. The wound measures 0.3cm length x 0.3cm width x 0.2cm depth; 0.071cm^2 area and 0.014cm^3 volume. There is Fat oe Layer (Subcutaneous Tissue) exposed. There is no tunneling or undermining noted. There is a none present amount of drainage noted. The wound margin is distinct with the outline attached to the wound base. There is large (67-100%) granulation within the wound bed. There is no necrotic tissue within the wound bed. The periwound skin appearance exhibited: Callus. The periwound skin appearance did not exhibit: Crepitus, Excoriation, Induration, Rash, Scarring, Dry/Scaly, Maceration, Atrophie Blanche, Cyanosis, Ecchymosis, Hemosiderin Staining, Mottled, Pallor, Rubor, Erythema. Wound #6 status is Healed - Epithelialized. Original cause of wound was Bump. The date acquired was: 06/16/2022. The wound has been in treatment 5 weeks. The wound is located on the Right,Anterior Lower Leg. The wound measures 0cm length x 0cm width x 0cm depth; 0cm^2 area and 0cm^3 volume. There is Fat Layer (Subcutaneous Tissue) exposed. There is no tunneling or undermining noted. There is a medium amount of serosanguineous drainage noted. The wound margin is distinct with the  outline attached to the wound base. There is no granulation within the wound bed. There is a large (67-100%) amount of necrotic tissue within the wound bed. The periwound skin appearance did not exhibit: Callus, Crepitus, Excoriation, Induration, Rash, Scarring, Dry/Scaly, Maceration, Atrophie Blanche, Cyanosis, Ecchymosis, Hemosiderin Staining, Mottled, Pallor, Rubor, Erythema. General Notes: scabbed over Wound #8 status is Open. Original cause of wound was Trauma. The date acquired was: 07/24/2022. The wound is located on the Left Knee. The wound measures 2cm length x 1.3cm width x 0.1cm depth; 2.042cm^2 area and 0.204cm^3 volume. There is no tunneling or undermining noted. There is a medium amount of serosanguineous drainage noted. The wound margin is distinct with the outline attached to the wound base. There is no granulation within the wound bed. There is a large (67-100%) amount of necrotic tissue within the wound bed including Eschar. The periwound skin appearance did not exhibit: Callus, Crepitus, Excoriation, Induration, Rash, Scarring, Dry/Scaly, Maceration, Atrophie Blanche, Cyanosis, Ecchymosis, Hemosiderin Staining, Mottled, Pallor, Rubor, Erythema. Periwound temperature was noted as No Abnormality. The periwound has tenderness on palpation. 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 idiopathic peripheral autonomic neuropathy Osteomyelitis, unspecified Unspecified atrial fibrillation Unspecified open wound, right lower leg, initial encounter Long term (current) use of anticoagulants Milosevic, Chrystina [name] ([id number]) [id number] Page 8 of 10 Lymphedema, not elsewhere classified Venous insufficiency (chronic) (peripheral) Chronic obstructive pulmonary disease, unspecified Peripheral vascular disease, unspecified Unspecified open wound, left lower leg, initial encounter Other early  complications of trauma, initial encounter Patient's right anterior leg wound appears healed. She has a small wound still remaining to the right great toe and I recommended continuing collagen and [name] antibiotic care. She developed a new wound to the left knee after falling. I debrided nonviable tissue and recommended Medihoney and Hydrofera Blue to this area. She is using Tubigrip to the right leg and compression stocking to the left leg. Plan Follow-up Appointments: Return Appointment in 2 weeks. - w/ Dr. Mikey Bussing Other: - [facility]. Anesthetic: (In clinic) Topical Lidocaine 5% applied to wound bed Edema Control - Lymphedema / SCD / Other: Elevate legs to the level of the heart or above for 30 minutes daily and/or when sitting, a frequency of: - throughout the day. Avoid standing for long periods of time. Moisturize legs daily. - every night before bed. Other Edema Control Orders/Instructions: - tubigrip size E to right leg apply in the morning and remove at night. Off-Loading: Open toe surgical shoe to: - while sitting and standing. Home Health: New wound care orders this week; continue Home Health for wound care. Megan utilize formulary equivalent dressing for wound treatment orders unless otherwise specified. - Home health weekly dressing changes. [name] and collagen to wounds change every day. New wound- use medihoney and Hydraferablue to left knee. Other Home Health Orders/Instructions: - [facility]. WOUND #5: - T Great Wound Laterality: Right oe Cleanser: Soap and Water (Home Health) 1 x Per Day/30 Days Discharge Instructions: Megan shower and wash wound with dial antibacterial soap and water prior to dressing change. Cleanser: Wound Cleanser (Home Health) 1 x Per Day/30 Days Discharge Instructions: Cleanse the wound with wound cleanser prior to applying a clean dressing using gauze sponges, not tissue or cotton balls. Topical: [name] T  opical antibiotics 1 x Per Day/30 Days Discharge Instructions: apply directly to wound bed. Prim Dressing: FIBRACOL Plus Dressing, 2x2 in (collagen) 1 x Per Day/30 Days ary Discharge Instructions: Moisten collagen with saline or hydrogel Secondary Dressing: Woven Gauze Sponges 2x2 in (Home Health) 1 x Per Day/30 Days Discharge Instructions: Apply over primary dressing. Megan tape down with medipore tape. Secured With: Insurance underwriterConforming Stretch Gauze Bandage, Sterile 2x75 (in/in) (Home Health) 1 x Per Day/30 Days Discharge Instructions: Secure with stretch gauze or use paper tape to secure the gauze. Secured With: Paper T ape, 2x10 (in/yd) (Home Health) 1 x Per Day/30 Days Discharge Instructions: Secure dressing with tape as directed. Com pression Wrap: tubi grips size E 1 x Per Day/30 Days WOUND #8: - Knee Wound Laterality: Left Cleanser: Soap and Water (Home Health) 1 x Per Day/30 Days Discharge Instructions: Megan shower and wash wound with dial antibacterial soap and water prior to dressing change. Cleanser: Wound Cleanser (Home Health) 1 x Per Day/30 Days Discharge Instructions: Cleanse the wound with wound cleanser prior to applying a clean dressing using gauze sponges, not tissue or cotton balls. Prim Dressing: Hydrofera Blue Ready Foam, 4x5 in (Home Health) 1 x Per Day/30 Days ary Discharge Instructions: Apply to wound bed as instructed Prim Dressing: MediHoney Gel, tube 1.5 (oz) (Home Health) 1 x Per Day/30 Days ary Discharge Instructions: Apply to wound bed as instructed Secondary Dressing: Woven Gauze Sponges 2x2 in (Home Health) 1 x Per Day/30 Days Discharge Instructions: Apply over primary dressing. Megan tape down with medipore tape. Secondary Dressing: Zetuvit Plus Silicone Border Dressing 4x4 (in/in) (Home Health) 1 x Per Day/30 Days Discharge Instructions: Apply silicone border over primary dressing as directed. 1. In office sharp debridement 2. Medihoney and Hydrofera Blue 3.  Collagen and [name] antibiotic ointment to the right great toe Electronic Signature(s) Signed: 07/24/2022 5:05:23 PM By: Megan CorwinHoffman, Megan Napp DO Entered By: Megan CorwinHoffman, Brysin Towery on 07/24/2022 16:28:32 HxROS Details -------------------------------------------------------------------------------- [name], [name] ([id number]) [id number] Page 9 of 10 Patient Name: Date of Service: Megan ReapHILL, Megan TTIE [name]. 07/24/2022 2:00 PM Medical Record Number: [id number] Patient Account Number: [id number] Date of Birth/Sex: Treating RN: 08/09/1928 (88 y.o. F) Primary Care Provider: Jorge NyHayes, Romana Other Clinician:  Referring Provider: Treating Provider/Extender: Rushie Goltz in Treatment: 25 Information Obtained From Patient Caregiver Eyes Medical History: Positive for: Cataracts Hematologic/Lymphatic Medical History: Positive for: Anemia Past Medical History Notes: Thrombocytopenia Respiratory Medical History: Negative for: Chronic Obstructive Pulmonary Disease (COPD) Cardiovascular Medical History: Positive for: Congestive Heart Failure; Hypotension; Peripheral Venous Disease Endocrine Medical History: Past Medical History Notes: Hyperthyroidism, Hypothyroidism Musculoskeletal Medical History: Positive for: 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 Neurologic Medical History: Positive for: Neuropathy HBO Extended History Items Eyes: Cataracts Immunizations Pneumococcal Vaccine: Received Pneumococcal Vaccination: No Implantable Devices None Family and Social History Cancer: Yes - Siblings; Diabetes: No; Heart Disease: Yes; Stroke: Yes - Mother; Former smoker - quit 2003; Marital Status - Widowed; Alcohol Use: Never; Drug Use: No History; Caffeine Use: Never; Financial Concerns: No; Food, Clothing or Shelter Needs: No; Support System Lacking: No; Transportation Concerns: No Electronic Signature(s) Signed: 07/24/2022 5:05:23 PM By: Megan Corwin DO Entered By: [name] on 07/24/2022 16:19:02 [name], [name] ([id number]) [id number] Page 10 of 10 -------------------------------------------------------------------------------- SuperBill Details Patient Name: Date of Service: LUCIEL, BRICKMAN 07/24/2022 Medical Record Number: [id number] Patient Account Number: [id number] Date of Birth/Sex: Treating RN: 1927-12-30 (88 y.o. F) Primary Care Provider: Jorge [name] Other Clinician: Referring Provider: Treating Provider/Extender: [name] Weeks in Treatment: 25 Diagnosis Coding ICD-10 Codes Code Description [phone] Non-pressure chronic ulcer of other part of right foot with fat layer exposed G90.09 Other idiopathic peripheral autonomic neuropathy M86.9 Osteomyelitis, unspecified I48.91 Unspecified atrial fibrillation S81.801A Unspecified open wound, right lower leg, initial encounter Z79.01 Long term (current) use of anticoagulants I89.0 Lymphedema, not elsewhere classified I87.2 Venous insufficiency (chronic) (peripheral) J44.9 Chronic obstructive pulmonary disease, unspecified I73.9 Peripheral vascular disease, unspecified S81.802A Unspecified open wound, left lower leg, initial encounter T79.8XXA Other early complications of trauma, initial encounter Facility Procedures : CPT4 Code: [id number] Description: [phone] - DEBRIDE WOUND 1ST 20 SQ CM OR < ICD-10 Diagnosis Description S81.802A Unspecified open wound, left lower leg, initial encounter Modifier: Quantity: 1 Physician Procedures : CPT4 Code Description Modifier [id number] - WC PHYS LEVEL 3 - EST PT ICD-10 Diagnosis Description S81.802A Unspecified open wound, left lower leg, initial encounter T79.8XXA Other early complications of trauma, initial encounter I87.2 Venous  insufficiency (chronic) (peripheral) Quantity: 1 : [id number] - WC PHYS DEBR WO ANESTH 20 SQ CM ICD-10 Diagnosis Description S81.802A  Unspecified open wound, left lower leg, initial encounter Quantity: 1 Electronic Signature(s) Signed: 07/24/2022 5:05:23 PM By: Megan Corwin DO Entered By: [name] on 07/24/2022 16:29:37

## 2022-07-25 NOTE — Progress Notes (Signed)
RAMONIA, [name] ([id number]) [id number] Page 1 of 10 Visit Report for 07/24/2022 Arrival Information Details Patient Name: Date of Service: [name], [name]. 07/24/2022 2:00 PM Medical Record Number: [id number] Patient Account Number: [id number] Date of Birth/Sex: Treating RN: 1928/05/22 (88 y.o. F) Primary Care [name]: [name] Other Clinician: Referring [name]: Treating Jary Louvier/Extender: [name] Weeks in Treatment: 25 Visit Information History Since Last Visit Added or deleted any medications: No Patient Arrived: Wheel Chair Any new allergies or adverse reactions: No Arrival Time: 15:29 Had a fall or experienced change in No Accompanied By: Daughter activities of daily living that may affect Transfer Assistance: None risk of falls: Patient Identification Verified: Yes Signs or symptoms of abuse/neglect since last visito No Secondary Verification Process Completed: Yes Hospitalized since last visit: No Patient Requires Transmission-Based Precautions: No Implantable device outside of the clinic excluding No Patient Has Alerts: Yes cellular tissue based products placed in the center Patient Alerts: Patient on Blood Thinner since last visit: Has Dressing in Place as Prescribed: Yes Pain Present Now: No Electronic Signature(s) Signed: 07/25/2022 12:40:42 PM By: Thayer Dallas Entered By: Thayer Dallas on 07/24/2022 15:30:00 -------------------------------------------------------------------------------- Encounter Discharge Information Details Patient Name: Date of Service: [name]. 07/24/2022 2:00 PM Medical Record Number: [id number] Patient Account Number: [id number] Date of Birth/Sex: Treating RN: [birth year] (88 y.o. [name], [name] Primary Care [name]: [name] Other Clinician: Referring [name]: Treating [name]: [name] Weeks in Treatment: 25 Encounter Discharge  Information Items Post Procedure Vitals Discharge Condition: Stable Temperature (F): 98.7 Ambulatory Status: Ambulatory Pulse (bpm): 74 Discharge Destination: Home Respiratory Rate (breaths/min): 17 Transportation: Private Auto Blood Pressure (mmHg): 120/80 Accompanied By: daughter Schedule Follow-up Appointment: Yes Clinical Summary of Care: Patient Declined Electronic Signature(s) Signed: 07/25/2022 12:09:11 PM By: Fonnie Mu RN Entered By: Fonnie Mu on 07/24/2022 16:34:10 [name], [name] ([id number]) [id number] Page 2 of 10 -------------------------------------------------------------------------------- Lower Extremity Assessment Details Patient Name: Date of Service: [name], [name] 07/24/2022 2:00 PM Medical Record Number: [id number] Patient Account Number: [id number] Date of Birth/Sex: Treating RN: Aug 27, 1928 (88 y.o. F) Primary Care [name]: [name] Other Clinician: Referring [name]: Treating [name]/Extender: [name] Weeks in Treatment: 25 Edema Assessment Assessed: [Left: No] [Right: No] Edema: [Left: Ye] [Right: s] Calf Left: Right: Point of Measurement: 30 cm From Medial Instep 44 cm 45 cm Ankle Left: Right: Point of Measurement: 9 cm From Medial Instep 26 cm 22.2 cm Electronic Signature(s) Signed: 07/25/2022 12:40:42 PM By: Thayer Dallas Entered By: Thayer Dallas on 07/24/2022 15:32:14 -------------------------------------------------------------------------------- Multi Wound Chart Details Patient Name: Date of Service: [name]. 07/24/2022 2:00 PM Medical Record Number: [id number] Patient Account Number: [id number] Date of Birth/Sex: Treating RN: [birth year] (88 y.o. F) Primary Care Zakaree Mcclenahan: [name] Other Clinician: Referring [name]: Treating [name]: [name] Weeks in Treatment: 25 Vital Signs Height(in): 62 Pulse(bpm):  54 Weight(lbs): 181 Blood Pressure(mmHg): 176/67 Body Mass Index(BMI): 33.1 Temperature(F): 98 Respiratory Rate(breaths/min): 18 [5:Photos:] [8:No Photos] Right T Great oe Right, Anterior Lower Leg Left Knee Wound Location: Shear/Friction Bump Trauma Wounding Event: Abrasion Skin Tear Trauma, Other Primary Etiology: Cataracts, Anemia, Congestive Heart Cataracts, Anemia, Congestive Heart Cataracts, Anemia, Congestive Heart Comorbid History: Failure, Hypotension, Peripheral Failure, Hypotension, Peripheral Failure, Hypotension, Peripheral Venous Disease, Osteomyelitis, Venous Disease, Osteomyelitis, Venous Disease, Osteomyelitis, [name], [name] ([id number]) [id number] Page 3 of 10 Neuropathy Neuropathy Neuropathy 02/06/2022 06/16/2022 07/24/2022 Date Acquired: 23 5 0 Weeks of Treatment: Open Healed - Epithelialized Open Wound Status:  No No No Wound Recurrence: 0.3x0.3x0.2 0x0x0 2x1.3x0.1 Measurements L x W x D (cm) 0.071 0 2.042 A (cm) : rea 0.014 0 0.204 Volume (cm) : 81.60% 100.00% N/A % Reduction in Area: 63.20% 100.00% N/A % Reduction in Volume: Full Thickness Without Exposed Full Thickness Without Exposed Full Thickness With Exposed Support Classification: Support Structures Support Structures Structures None Present Medium Medium Exudate A mount: N/A Serosanguineous Serosanguineous Exudate Type: N/A red, brown red, brown Exudate Color: Distinct, outline attached Distinct, outline attached Distinct, outline attached Wound Margin: Large (67-100%) None Present (0%) None Present (0%) Granulation Amount: None Present (0%) Large (67-100%) Large (67-100%) Necrotic Amount: N/A N/A Eschar Necrotic Tissue: Fat Layer (Subcutaneous Tissue): Yes Fat Layer (Subcutaneous Tissue): Yes Fascia: No Exposed Structures: Fascia: No Fascia: No Fat Layer (Subcutaneous Tissue): No Tendon: No Tendon: No Tendon: No Muscle: No Muscle: No Muscle:  No Joint: No Joint: No Joint: No Bone: No Bone: No Bone: No Large (67-100%) None None Epithelialization: Callus: Yes Excoriation: No Excoriation: No Periwound Skin Texture: Excoriation: No Induration: No Induration: No Induration: No Callus: No Callus: No Crepitus: No Crepitus: No Crepitus: No Rash: No Rash: No Rash: No Scarring: No Scarring: No Scarring: No Maceration: No Maceration: No Maceration: No Periwound Skin Moisture: Dry/Scaly: No Dry/Scaly: No Dry/Scaly: No Atrophie Blanche: No Atrophie Blanche: No Atrophie Blanche: No Periwound Skin Color: Cyanosis: No Cyanosis: No Cyanosis: No Ecchymosis: No Ecchymosis: No Ecchymosis: No Erythema: No Erythema: No Erythema: No Hemosiderin Staining: No Hemosiderin Staining: No Hemosiderin Staining: No Mottled: No Mottled: No Mottled: No Pallor: No Pallor: No Pallor: No Rubor: No Rubor: No Rubor: No N/A N/A No Abnormality Temperature: N/A N/A Yes Tenderness on Palpation: N/A scabbed over N/A Assessment Notes: Treatment Notes Electronic Signature(s) Signed: 07/24/2022 5:05:23 PM By: Geralyn Corwin DO Entered By: Geralyn Corwin on 07/24/2022 16:15:17 -------------------------------------------------------------------------------- Multi-Disciplinary Care Plan Details Patient Name: Date of Service: [name]. 07/24/2022 2:00 PM Medical Record Number: [id number] Patient Account Number: [id number] Date of Birth/Sex: Treating RN: [birth year] (88 y.o. [name], [name] Primary Care Tahj Lindseth: [name] Other Clinician: Referring Kaulder Zahner: Treating Amila Callies/Extender: [name] Weeks in Treatment: 25 Active Inactive Nutrition Nursing Diagnoses: Potential for alteratiion in Nutrition/Potential for imbalanced nutrition Goals: Patient/caregiver agrees to and verbalizes understanding of need to use nutritional supplements and/or vitamins as prescribed [name], [name]  ([id number]) [id number] Page 4 of 10 Date Initiated: 01/28/2022 Target Resolution Date: 08/08/2022 Goal Status: Active Interventions: Assess patient nutrition upon admission and as needed per policy Provide education on nutrition Treatment Activities: Education provided on Nutrition : 04/22/2022 Patient referred to Primary Care Physician for further nutritional evaluation : 01/28/2022 Notes: Wound/Skin Impairment Nursing Diagnoses: Knowledge deficit related to ulceration/compromised skin integrity Goals: Patient/caregiver will verbalize understanding of skin care regimen Date Initiated: 01/28/2022 Target Resolution Date: 08/07/2022 Goal Status: Active Interventions: Assess patient/caregiver ability to perform ulcer/skin care regimen upon admission and as needed Assess ulceration(s) every visit Provide education on ulcer and skin care Treatment Activities: Skin care regimen initiated : 01/28/2022 Topical wound management initiated : 01/28/2022 Notes: Electronic Signature(s) Signed: 07/25/2022 12:09:11 PM By: Fonnie Mu RN Entered By: Fonnie Mu on 07/24/2022 15:56:47 -------------------------------------------------------------------------------- Pain Assessment Details Patient Name: Date of Service: [name]. 07/24/2022 2:00 PM Medical Record Number: [id number] Patient Account Number: [id number] Date of Birth/Sex: Treating RN: Jan 03, 1928 (88 y.o. F) Primary Care Sunset Joshi: [name] Other Clinician: Referring Traci Gafford: Treating Shannan Slinker/Extender: [name] Weeks in Treatment: 25 Active Problems Location of Pain  Severity and Description of Pain Patient Has Paino No [address] [place], Alegandra [name] ([id number]) [id number] Page 5 of 10 Pain Management and Medication Current Pain Management: Electronic Signature(s) Signed: 07/25/2022 12:40:42 PM By: Erenest Blank Entered By: Erenest Blank on 07/24/2022 15:32:30 -------------------------------------------------------------------------------- Patient/Caregiver Education Details Patient Name: Date of Service: Megan Bogus 11/16/2023andnbsp2:00 PM Medical Record Number: [id number] Patient Account Number: [id number] Date of Birth/Gender: Treating RN: [birth year] (88 y.o. Benjaman Lobe Primary Care Physician: Windy Carina Other Clinician: Referring Physician: Treating Physician/Extender: Veneda Melter Weeks in Treatment: 25 Education Assessment Education Provided To: Patient and Caregiver Education Topics Provided Wound/Skin Impairment: Methods: Explain/Verbal Responses: State content correctly [employer]) Signed: 07/25/2022 12:09:11 PM By: Megan Hammock RN Entered By: [name] on 07/24/2022 15:56:59 -------------------------------------------------------------------------------- Wound Assessment Details Patient Name: Date of Service: Megan Bogus. 07/24/2022 2:00 PM Medical Record Number: [id number] Patient Account Number: [id number] Date of Birth/Sex: Treating RN: [birth year] (88 y.o. F) Primary Care [place] Rosamond: Windy Carina Other Clinician: Referring Shantina Chronister: Treating Brinton Brandel/Extender: Veneda Melter [place], [place] Jerilynn Mages ([id number]) [id number] Page 6 of 10 Weeks in Treatment: 25 Wound Status Wound Number: 5 Primary Abrasion Etiology: Wound Location: Right T Great oe Wound Open Wounding Event: Shear/Friction Status: Date Acquired: 02/06/2022 Comorbid Cataracts, Anemia, Congestive Heart Failure, Hypotension, Weeks Of Treatment: 23 History: Peripheral Venous Disease, Osteomyelitis, Neuropathy Clustered Wound: No Photos Wound Measurements Length: (cm) 0.3 Width: (cm) 0.3 Depth: (cm) 0.2 Area: (cm) 0.071 Volume: (cm) 0.014 % Reduction in Area: 81.6% % Reduction in Volume: 63.2% Epithelialization: Large  (67-100%) Tunneling: No Undermining: No Wound Description Classification: Full Thickness Without Exposed Support Structures Wound Margin: Distinct, outline attached Exudate Amount: None Present Foul Odor After Cleansing: No Slough/Fibrino No Wound Bed Granulation Amount: Large (67-100%) Exposed Structure Necrotic Amount: None Present (0%) Fascia Exposed: No Fat Layer (Subcutaneous Tissue) Exposed: Yes Tendon Exposed: No Muscle Exposed: No Joint Exposed: No Bone Exposed: No Periwound Skin Texture Texture Color No Abnormalities Noted: No No Abnormalities Noted: No Callus: Yes Atrophie Blanche: No Crepitus: No Cyanosis: No Excoriation: No Ecchymosis: No Induration: No Erythema: No Rash: No Hemosiderin Staining: No Scarring: No Mottled: No Pallor: No Moisture Rubor: No No Abnormalities Noted: No Dry / Scaly: No Maceration: No Treatment Notes Wound #5 (Toe Great) Wound Laterality: Right Cleanser Soap and Water Discharge Instruction: May shower and wash wound with dial antibacterial soap and water prior to dressing change. Wound Cleanser Discharge Instruction: Cleanse the wound with wound cleanser prior to applying a clean dressing using gauze sponges, not tissue or cotton balls. Peri-Wound Care Topical Keystone Topical antibiotics [name], [name] ([id number]) [id number] Page 7 of 10 Discharge Instruction: apply directly to wound bed. Primary Dressing FIBRACOL Plus Dressing, 2x2 in (collagen) Discharge Instruction: Moisten collagen with saline or hydrogel Secondary Dressing Woven Gauze Sponges 2x2 in Discharge Instruction: Apply over primary dressing. may tape down with medipore tape. Secured With Conforming Stretch Gauze Bandage, Sterile 2x75 (in/in) Discharge Instruction: Secure with stretch gauze or use paper tape to secure the gauze. Paper Tape, 2x10 (in/yd) Discharge Instruction: Secure dressing with tape as directed. Compression  Wrap tubi grips size E Compression Stockings Add-Ons Electronic Signature(s) Signed: 07/25/2022 12:40:42 PM By: Erenest Blank Entered By: Erenest Blank on 07/24/2022 15:38:27 -------------------------------------------------------------------------------- Wound Assessment Details Patient Name: Date of Service: Megan Bogus. 07/24/2022 2:00 PM Medical Record Number: [id number] Patient Account Number: [id number] Date of Birth/Sex: Treating RN: 1928/03/24 (88 y.o. [name], [name] Primary Care Coleen Cardiff: Windy Carina Other Clinician:  Referring Joella Saefong: Treating Kace Hartje/Extender: Veneda Melter Weeks in Treatment: 25 Wound Status Wound Number: 6 Primary Skin Tear Etiology: Wound Location: Right, Anterior Lower Leg Wound Healed - Epithelialized Wounding Event: Bump Status: Date Acquired: 06/16/2022 Comorbid Cataracts, Anemia, Congestive Heart Failure, Hypotension, Weeks Of Treatment: 5 History: Peripheral Venous Disease, Osteomyelitis, Neuropathy Clustered Wound: No Photos Wound Measurements Length: (cm) Width: (cm) Depth: (cm) Area: (cm) Volume: (cm) 0 % Reduction in Area: 100% 0 % Reduction in Volume: 100% 0 Epithelialization: None 0 Tunneling: No 0 Undermining: No Wound Description [name], [name] ([id number]) Classification: Full Thickness Without Exposed Support Structures Wound Margin: Distinct, outline attached Exudate Amount: Medium Exudate Type: Serosanguineous Exudate Color: red, brown [id number] Page 8 of 10 Foul Odor After Cleansing: No Slough/Fibrino Yes Wound Bed Granulation Amount: None Present (0%) Exposed Structure Necrotic Amount: Large (67-100%) Fascia Exposed: No Fat Layer (Subcutaneous Tissue) Exposed: Yes Tendon Exposed: No Muscle Exposed: No Joint Exposed: No Bone Exposed: No Periwound Skin Texture Texture Color No Abnormalities Noted: No No Abnormalities Noted: No Callus:  No Atrophie Blanche: No Crepitus: No Cyanosis: No Excoriation: No Ecchymosis: No Induration: No Erythema: No Rash: No Hemosiderin Staining: No Scarring: No Mottled: No Pallor: No Moisture Rubor: No No Abnormalities Noted: No Dry / Scaly: No Maceration: No Assessment Notes scabbed over Treatment Notes Wound #6 (Lower Leg) Wound Laterality: Right, Anterior Cleanser Peri-Wound Care Topical Primary Dressing Secondary Dressing Secured With Compression Wrap Compression Stockings Add-Ons Electronic Signature(s) Signed: 07/25/2022 12:09:11 PM By: Megan Hammock RN Entered By: [name] on 07/24/2022 15:55:05 -------------------------------------------------------------------------------- Wound Assessment Details Patient Name: Date of Service: Megan Bogus. 07/24/2022 2:00 PM Medical Record Number: [id number] Patient Account Number: [id number] Date of Birth/Sex: Treating RN: 10-Apr-1928 (88 y.o. [name], [name] Primary Care Nazier Neyhart: Windy Carina Other Clinician: Referring Jagar Lua: Treating [name]/Extender: Veneda Melter Weeks in Treatment: 25 Wound Status Wound Number: 8 Primary Trauma, Other Etiology: [name], [name] ([id number]) [id number] Page 9 of 10 Etiology: Wound Location: Left Knee Wound Open Wounding Event: Trauma Status: Date Acquired: 07/24/2022 Comorbid Cataracts, Anemia, Congestive Heart Failure, Hypotension, Weeks Of Treatment: 0 History: Peripheral Venous Disease, Osteomyelitis, Neuropathy Clustered Wound: No Wound Measurements Length: (cm) 2 Width: (cm) 1.3 Depth: (cm) 0.1 Area: (cm) 2.042 Volume: (cm) 0.204 % Reduction in Area: % Reduction in Volume: Epithelialization: None Tunneling: No Undermining: No Wound Description Classification: Full Thickness With Exposed Support Structures Wound Margin: Distinct, outline attached Exudate Amount: Medium Exudate Type:  Serosanguineous Exudate Color: red, brown Foul Odor After Cleansing: No Slough/Fibrino Yes Wound Bed Granulation Amount: None Present (0%) Exposed Structure Necrotic Amount: Large (67-100%) Fascia Exposed: No Necrotic Quality: Eschar Fat Layer (Subcutaneous Tissue) Exposed: No Tendon Exposed: No Muscle Exposed: No Joint Exposed: No Bone Exposed: No Periwound Skin Texture Texture Color No Abnormalities Noted: No No Abnormalities Noted: No Callus: No Atrophie Blanche: No Crepitus: No Cyanosis: No Excoriation: No Ecchymosis: No Induration: No Erythema: No Rash: No Hemosiderin Staining: No Scarring: No Mottled: No Pallor: No Moisture Rubor: No No Abnormalities Noted: No Dry / Scaly: No Temperature / Pain Maceration: No Temperature: No Abnormality Tenderness on Palpation: Yes Treatment Notes Wound #8 (Knee) Wound Laterality: Left Cleanser Soap and Water Discharge Instruction: May shower and wash wound with dial antibacterial soap and water prior to dressing change. Wound Cleanser Discharge Instruction: Cleanse the wound with wound cleanser prior to applying a clean dressing using gauze sponges, not tissue or cotton balls. Peri-Wound Care Topical Primary Dressing Hydrofera Blue Ready Foam, 4x5  in Discharge Instruction: Apply to wound bed as instructed MediHoney Gel, tube 1.5 (oz) Discharge Instruction: Apply to wound bed as instructed Secondary Dressing Woven Gauze Sponges 2x2 in Discharge Instruction: Apply over primary dressing. may tape down with medipore tape. Zetuvit Plus Silicone Border Dressing 4x4 (in/in) Discharge Instruction: Apply silicone border over primary dressing as directed. Secured With [employer], Mallissa [name] ([id number]) [id number] Page 10 of 10 Compression Stockings Add-Ons Electronic Signature(s) Signed: 07/25/2022 12:09:11 PM By: Megan Hammock RN Entered By: [name] on 07/24/2022  15:52:43 -------------------------------------------------------------------------------- Vitals Details Patient Name: Date of Service: Megan Bogus. 07/24/2022 2:00 PM Medical Record Number: [id number] Patient Account Number: [id number] Date of Birth/Sex: Treating RN: 1928-06-19 (88 y.o. F) Primary Care Gurvir Schrom: Windy Carina Other Clinician: Referring Square Jowett: Treating Nel Stoneking/Extender: Veneda Melter Weeks in Treatment: 25 Vital Signs Time Taken: 15:32 Temperature (F): 98 Height (in): 62 Pulse (bpm): 54 Weight (lbs): 181 Respiratory Rate (breaths/min): 18 Body Mass Index (BMI): 33.1 Blood Pressure (mmHg): 176/67 Reference Range: 80 - 120 mg / dl Electronic Signature(s) Signed: 07/25/2022 12:40:42 PM By: Erenest Blank Entered By: Erenest Blank on 07/24/2022 15:39:33

## 2022-08-07 ENCOUNTER — Encounter (HOSPITAL_BASED_OUTPATIENT_CLINIC_OR_DEPARTMENT_OTHER): Payer: Medicare HMO | Admitting: Internal Medicine

## 2022-08-07 DIAGNOSIS — L97512 Non-pressure chronic ulcer of other part of right foot with fat layer exposed: Secondary | ICD-10-CM | POA: Diagnosis not present

## 2022-08-07 DIAGNOSIS — T798XXA Other early complications of trauma, initial encounter: Secondary | ICD-10-CM

## 2022-08-07 DIAGNOSIS — S81802A Unspecified open wound, left lower leg, initial encounter: Secondary | ICD-10-CM | POA: Diagnosis not present

## 2022-08-07 DIAGNOSIS — I890 Lymphedema, not elsewhere classified: Secondary | ICD-10-CM | POA: Diagnosis not present

## 2022-08-08 NOTE — Progress Notes (Signed)
KEARSTEN, GINTHER ([id number]) [id number] Page 1 of 11 Visit Report for 08/07/2022 Arrival Information Details Patient Name: Date of Service: Megan, [name] 08/07/2022 2:30 PM Medical Record Number: [id number] Patient Account Number: [id number] Date of Birth/Sex: Treating RN: 02/03/1928 (88 y.o. [name], [name] Primary Care [name]: [name] Other Clinician: Referring [name]: Treating [name]/Extender: [name] Weeks in Treatment: 27 Visit Information History Since Last Visit Added or deleted any medications: No Patient Arrived: Wheel Chair Any new allergies or adverse reactions: No Arrival Time: 14:55 Had a fall or experienced change in No Accompanied By: daughter activities of daily living that may affect Transfer Assistance: None risk of falls: Patient Identification Verified: Yes Signs or symptoms of abuse/neglect since last visito No Secondary Verification Process Completed: Yes Hospitalized since last visit: No Patient Requires Transmission-Based Precautions: No Implantable device outside of the clinic excluding No Patient Has Alerts: Yes cellular tissue based products placed in the center Patient Alerts: Patient on Blood Thinner since last visit: Has Dressing in Place as Prescribed: Yes Has Compression in Place as Prescribed: Yes Pain Present Now: No Electronic Signature(s) Signed: 08/07/2022 6:11:43 PM By: Megan Stall RN, BSN Entered By: [name] on 08/07/2022 14:59:19 -------------------------------------------------------------------------------- Clinic Level of Care Assessment Details Patient Name: Date of Service: [name]. 08/07/2022 2:30 PM Medical Record Number: [id number] Patient Account Number: [id number] Date of Birth/Sex: Treating RN: 1928-06-19 (88 y.o. [name], [name] Primary Care Chey Cho: [name] Other Clinician: Referring [name]: Treating [name]/Extender: [name] Weeks in Treatment: 27 Clinic Level of Care Assessment Items TOOL 4 Quantity Score X- 1 0 Use when only an EandM is performed on FOLLOW-UP visit ASSESSMENTS - Nursing Assessment / Reassessment X- 1 10 Reassessment of Co-morbidities (includes updates in patient status) X- 1 5 Reassessment of Adherence to Treatment Plan ASSESSMENTS - Wound and Skin A ssessment / Reassessment []  - 0 Simple Wound Assessment / Reassessment - one wound X- 2 5 Complex Wound Assessment / Reassessment - multiple wounds []  - 0 Dermatologic / Skin Assessment (not related to wound area) ASSESSMENTS - Focused Assessment X- 2 5 Circumferential Edema Measurements - multi extremities []  - 0 Nutritional Assessment / Counseling / Intervention Megan, [name] (  .pdf Page 2 of 11 []  - 0 Lower Extremity Assessment (monofilament, tuning fork, pulses) []  - 0 Peripheral Arterial Disease Assessment (using hand held doppler) ASSESSMENTS - Ostomy and/or Continence Assessment and Care []  - 0 Incontinence Assessment and Management []  - 0 Ostomy Care Assessment and Management (repouching, etc.) PROCESS - Coordination of Care []  - 0 Simple Patient / Family Education for ongoing care X- 1 20 Complex (extensive) Patient / Family Education for ongoing care X- 1 10 Staff obtains Modena Morrow, Records, T Results / Process Orders est []  - 0 Staff telephones HHA, Nursing Homes / Clarify orders / etc []  - 0 Routine Transfer to another Facility (non-emergent condition) []  - 0 Routine Hospital Admission (non-emergent condition) []  - 0 New Admissions / [id number] / Ordering NPWT Apligraf, etc. , []  - 0 Emergency Hospital Admission (emergent condition) []  - 0 Simple Discharge Coordination X- 1 15 Complex (extensive) Discharge Coordination PROCESS - Special Needs []  - 0 Pediatric / Minor Patient Management []  - 0 Isolation Patient  Management []  - 0 Hearing / Language / Visual special needs []  - 0 Assessment of Community assistance (transportation, D/C planning, etc.) []  - 0 Additional assistance / Altered mentation []  - 0 Support Surface(s) Assessment (bed, cushion, seat,  etc.) INTERVENTIONS - Wound Cleansing / Measurement []  - 0 Simple Wound Cleansing - one wound X- 2 5 Complex Wound Cleansing - multiple wounds X- 1 5 Wound Imaging (photographs - any number of wounds) []  - 0 Wound Tracing (instead of photographs) []  - 0 Simple Wound Measurement - one wound X- 2 5 Complex Wound Measurement - multiple wounds INTERVENTIONS - Wound Dressings []  - 0 Small Wound Dressing one or multiple wounds X- 2 15 Medium Wound Dressing one or multiple wounds []  - 0 Large Wound Dressing one or multiple wounds X- 1 5 Application of Medications - topical []  - 0 Application of Medications - injection INTERVENTIONS - Miscellaneous []  - 0 External ear exam []  - 0 Specimen Collection (cultures, biopsies, blood, body fluids, etc.) []  - 0 Specimen(s) / Culture(s) sent or taken to Lab for analysis []  - 0 Patient Transfer (multiple staff / / Similar devices) []  - 0 Simple Staple / Suture removal (25 or less) []  - 0 Complex Staple / Suture removal (26 or more) []  - 0 Hypo / Hyperglycemic Management (close monitor of Blood Glucose) Erion, Juletta [name] (  .pdf Page 3 of 11 []  - 0 Ankle / Brachial Index (ABI) - do not check if billed separately X- 1 5 Vital Signs Has the patient been seen at the hospital within the last three years: Yes Total Score: 145 Level Of Care: New/Established - Level 4 Electronic Signature(s) Signed: 08/07/2022 5:26:06 PM By: RN Entered By: on 08/07/2022 15:36:36 -------------------------------------------------------------------------------- Encounter Discharge Information Details Patient Name: Date of  Service: . 08/07/2022 2:30 PM Medical Record Number: Patient Account Number: Nurse, adult Date of Birth/Sex: Treating RN: 1927/11/15 (88 y.o. , [name] Primary Care [name]: [id number] Other Clinician: Referring [name]: Treating [name]/Extender: ) [id number] Weeks in Treatment: 27 Encounter Discharge Information Items Discharge Condition: Stable Ambulatory Status: Wheelchair Discharge Destination: Home Transportation: Private Auto Accompanied By: self Schedule Follow-up Appointment: Yes Clinical Summary of Care: Patient Declined Electronic Signature(s) Signed: 08/07/2022 5:26:06 PM By: Fonnie Mu RN Entered By: Fonnie Mu on 08/07/2022 15:37:12 -------------------------------------------------------------------------------- Lower Extremity Assessment Details Patient Name: Date of Service: [name]. 08/07/2022 2:30 PM Medical Record Number: [id number] Patient Account Number: [id number] Date of Birth/Sex: Treating RN: 04/24/1928 (88 y.o. [name] Primary Care Saivion Goettel: [name] Other Clinician: Referring Milinda Sweeney: Treating Kasten Leveque/Extender: [name] Weeks in Treatment: 27 Edema Assessment Assessed: [Left: No] [Right: Yes] Edema: [Left: Ye] [Right: s] Calf Left: Right: Point of Measurement: 30 cm From Medial Instep 45 cm Ankle Left: Right: Point of Measurement: 9 cm From Medial Instep 24 cm Vascular Assessment Left: 08/09/2022.pdf Page 4 of 11Right:] Pulses: Dorsalis Pedis Palpable: Fonnie Mu.pdf Page 4 of 11Yes] Electronic Signature(s) Signed: 08/07/2022 6:11:43 PM By: 08/09/2022 RN, BSN Entered By: [name] on 08/07/2022 14:59:52 -------------------------------------------------------------------------------- Multi Wound Chart Details Patient Name: Date of Service: [id number]. 08/07/2022 2:30  PM Medical Record Number: 05/11/1928 Patient Account Number: [id number] Date of Birth/Sex: Treating RN: 1928/05/13 (88 y.o. F) Primary Care Frisco Cordts: [name] Other Clinician: Referring Donneisha Beane: Treating Erandy Mceachern/Extender: 28 Weeks in Treatment: 27 Vital Signs Height(in): 62 Pulse(bpm): 64 Weight(lbs): 181 Blood Pressure(mmHg): 125/66 Body Mass Index(BMI): 33.1 Temperature(F): 98.2 Respiratory Rate(breaths/min): 18 [5:Photos:] [N/A:N/A] Right T Great oe Left Knee N/A Wound Location: Shear/Friction Trauma N/A Wounding Event: Abrasion Trauma, Other N/A Primary Etiology: Cataracts, Anemia, Congestive Heart Cataracts, Anemia, Congestive Heart N/A Comorbid History: Failure,  Hypotension, Peripheral Failure, Hypotension, Peripheral Venous Disease, Osteomyelitis, Venous Disease, Osteomyelitis, Neuropathy Neuropathy 02/06/2022 07/24/2022 N/A Date Acquired: 25 2 N/A Weeks of Treatment: Open Open N/A Wound Status: No No N/A Wound Recurrence: 0x0x0 0.8x0.4x0.1 N/A Measurements L x W x D (cm) 0 0.251 N/A A (cm) : rea 0 0.025 N/A Volume (cm) : 100.00% 87.70% N/A % Reduction in Area: 100.00% 87.70% N/A % Reduction in Volume: Full Thickness Without Exposed Full Thickness With Exposed Support N/A Classification: Support Structures Structures None Present None Present N/A Exudate Amount: Distinct, outline attached Distinct, outline attached N/A Wound Margin: None Present (0%) Medium (34-66%) N/A Granulation Amount: None Present (0%) Medium (34-66%) N/A Necrotic Amount: Fat Layer (Subcutaneous Tissue): Yes Fascia: No N/A Exposed Structures: Fascia: No Fat Layer (Subcutaneous Tissue): No Tendon: No Tendon: No Muscle: No Muscle: No Joint: No Joint: No Bone: No Bone: No Large (67-100%) Large (67-100%) N/A Epithelialization: Callus: Yes Excoriation: No N/A Periwound Skin Texture: Excoriation: No Induration: No Induration:  No Callus: No Crepitus: No Crepitus: No Rash: No Rash: No [place], [name] ([id number]) [id number] Page 5 of 11 Scarring: No Scarring: No Maceration: No Maceration: No N/A Periwound Skin Moisture: Dry/Scaly: No Dry/Scaly: No Atrophie Blanche: No Atrophie Blanche: No N/A Periwound Skin Color: Cyanosis: No Cyanosis: No Ecchymosis: No Ecchymosis: No Erythema: No Erythema: No Hemosiderin Staining: No Hemosiderin Staining: No Mottled: No Mottled: No Pallor: No Pallor: No Rubor: No Rubor: No N/A No Abnormality N/A Temperature: N/A Yes N/A Tenderness on Palpation: Treatment Notes Wound #5 (Toe Great) Wound Laterality: Right Cleanser Peri-Wound Care Topical Primary Dressing Secondary Dressing Secured With Compression Wrap Compression Stockings Add-Ons Wound #8 (Knee) Wound Laterality: Left Cleanser Soap and Water Discharge Instruction: May shower and wash wound with dial antibacterial soap and water prior to dressing change. Wound Cleanser Discharge Instruction: Cleanse the wound with wound cleanser prior to applying a clean dressing using gauze sponges, not tissue or cotton balls. Peri-Wound Care Topical Primary Dressing MediHoney Gel, tube 1.5 (oz) Discharge Instruction: Apply to wound bed as instructed Hydrofera Blue Ready Foam, 4x5 in Discharge Instruction: Apply to wound bed as instructed Secondary Dressing Woven Gauze Sponges 2x2 in Discharge Instruction: Apply over primary dressing. may tape down with medipore tape. Zetuvit Plus Silicone Border Dressing 4x4 (in/in) Discharge Instruction: Apply silicone border over primary dressing as directed. Secured With Compression Wrap Compression Stockings Add-Ons Electronic Signature(s) Signed: 08/07/2022 5:09:59 PM By: Geralyn Corwin DO Entered By: Geralyn Corwin on 08/07/2022 16:37:19 Madaris, [name] ([id number]) [id number] Page 6 of  11 -------------------------------------------------------------------------------- Multi-Disciplinary Care Plan Details Patient Name: Date of Service: ADDALIE, CALLES 08/07/2022 2:30 PM Medical Record Number: [id number] Patient Account Number: [id number] Date of Birth/Sex: Treating RN: [birth year] (88 y.o. [name], [name] Primary Care Destony Prevost: [name] Other Clinician: Referring Dayna Alia: Treating Ravenna Legore/Extender: [name] Weeks in Treatment: 27 Active Inactive Nutrition Nursing Diagnoses: Potential for alteratiion in Nutrition/Potential for imbalanced nutrition Goals: Patient/caregiver agrees to and verbalizes understanding of need to use nutritional supplements and/or vitamins as prescribed Date Initiated: 01/28/2022 Target Resolution Date: 08/08/2022 Goal Status: Active Interventions: Assess patient nutrition upon admission and as needed per policy Provide education on nutrition Treatment Activities: Education provided on Nutrition : 05/20/2022 Patient referred to Primary Care Physician for further nutritional evaluation : 01/28/2022 Notes: Wound/Skin Impairment Nursing Diagnoses: Knowledge deficit related to ulceration/compromised skin integrity Goals: Patient/caregiver will verbalize understanding of skin care regimen Date Initiated: 01/28/2022 Target Resolution Date: 08/07/2022 Goal Status: Active Interventions: Assess patient/caregiver ability to  perform ulcer/skin care regimen upon admission and as needed Assess ulceration(s) every visit Provide education on ulcer and skin care Treatment Activities: Skin care regimen initiated : 01/28/2022 Topical wound management initiated : 01/28/2022 Notes: Electronic Signature(s) Signed: 08/07/2022 5:26:06 PM By: Fonnie MuBreedlove, [name] RN Entered By: Fonnie MuBreedlove, [name] on 08/07/2022 15:35:18 -------------------------------------------------------------------------------- Pain Assessment Details Patient  Name: Date of Service: Megan ReapHILL, HA TTIE [name]. 08/07/2022 2:30 PM Medical Record Number: [id number] Patient Account Number: [id number] Modena MorrowHILL, [name] ([id number]) [id number] Page 7 of 11 Date of Birth/Sex: Treating RN: 12/06/1927 (88 y.o. [name]) Deaton, Bobbi Primary Care Lavere Shinsky: Other Clinician: Jorge NyHayes, Romana Referring Alaria Oconnor: Treating Coryn Mosso/Extender: Megan LarssonHoffman, Jessica Hayes, Romana Weeks in Treatment: 27 Active Problems Location of Pain Severity and Description of Pain Patient Has Paino No Site Locations Rate the pain. Current Pain Level: 0 Pain Management and Medication Current Pain Management: Medication: No Cold Application: No Rest: No Massage: No Activity: No T.E.N.S.: No Heat Application: No Leg drop or elevation: No Is the Current Pain Management Adequate: Adequate How does your wound impact your activities of daily livingo Sleep: No Bathing: No Appetite: No Relationship With Others: No Bladder Continence: No Emotions: No Bowel Continence: No Work: No Toileting: No Drive: No Dressing: No Hobbies: No Psychologist, prison and probation serviceslectronic Signature(s) Signed: 08/07/2022 6:11:43 PM By: Megan Stalleaton, Bobbi RN, BSN Entered By: Megan Stalleaton, Bobbi on 08/07/2022 14:59:37 -------------------------------------------------------------------------------- Patient/Caregiver Education Details Patient Name: Date of Service: Megan ReapHILL, HA TTIE [name]. 11/30/2023andnbsp2:30 PM Medical Record Number: [id number] Patient Account Number: [id number] Date of Birth/Gender: Treating RN: 10/07/1927 (88 y.o. Megan RowanF) Breedlove, [name] Primary Care Physician: Megan NyHayes, Romana Other Clinician: Referring Physician: Treating Physician/Extender: Megan LarssonHoffman, Jessica Hayes, Romana Weeks in Treatment: 27 Education Assessment Education Provided To: Patient Education Topics Provided Nutrition: Methods: Explain/Verbal Modena MorrowHILL, [name] ([id number]) [id number] Page 8 of 11 Responses: Reinforcements  needed, State content correctly Wound/Skin Impairment: Methods: Explain/Verbal Responses: Reinforcements needed, State content correctly Electronic Signature(s) Signed: 08/07/2022 5:26:06 PM By: Fonnie MuBreedlove, [name] RN Entered By: Fonnie MuBreedlove, [name] on 08/07/2022 15:35:34 -------------------------------------------------------------------------------- Wound Assessment Details Patient Name: Date of Service: Megan ReapHILL, HA TTIE [name]. 08/07/2022 2:30 PM Medical Record Number: [id number] Patient Account Number: [id number] Date of Birth/Sex: Treating RN: 01/08/1928 (88 y.o. Megan PickettF) Deaton, Megan KendallBobbi Primary Care Robey Massmann: Megan NyHayes, Romana Other Clinician: Referring Rosetta Rupnow: Treating Keelie Zemanek/Extender: Megan LarssonHoffman, Jessica Hayes, Romana Weeks in Treatment: 27 Wound Status Wound Number: 5 Primary Abrasion Etiology: Wound Location: Right T Great oe Wound Open Wounding Event: Shear/Friction Status: Date Acquired: 02/06/2022 Comorbid Cataracts, Anemia, Congestive Heart Failure, Hypotension, Weeks Of Treatment: 25 History: Peripheral Venous Disease, Osteomyelitis, Neuropathy Clustered Wound: No Photos Wound Measurements Length: (cm) Width: (cm) Depth: (cm) Area: (cm) Volume: (cm) 0 % Reduction in Area: 100% 0 % Reduction in Volume: 100% 0 Epithelialization: Large (67-100%) 0 Tunneling: No 0 Undermining: No Wound Description Classification: Full Thickness Without Exposed Suppo Wound Margin: Distinct, outline attached Exudate Amount: None Present rt Structures Foul Odor After Cleansing: No Slough/Fibrino No Wound Bed Granulation Amount: None Present (0%) Exposed Structure Necrotic Amount: None Present (0%) Fascia Exposed: No Fat Layer (Subcutaneous Tissue) Exposed: Yes Tendon Exposed: No Muscle Exposed: No Joint Exposed: No Bone Exposed: No Periwound Skin Texture Texture Color Clapper, [name] ([id number]) [id number] Page 9 of 11 No Abnormalities Noted: No No Abnormalities  Noted: No Callus: Yes Atrophie Blanche: No Crepitus: No Cyanosis: No Excoriation: No Ecchymosis: No Induration: No Erythema: No Rash: No Hemosiderin Staining: No Scarring: No Mottled: No Pallor: No Moisture Rubor: No No Abnormalities Noted: No Dry / Scaly: No Maceration: No Electronic Signature(s) Signed: 08/07/2022 6:11:43  PM By: Megan Stall RN, BSN Entered By: [name] on 08/07/2022 15:01:26 -------------------------------------------------------------------------------- Wound Assessment Details Patient Name: Date of Service: [place], [name]. 08/07/2022 2:30 PM Medical Record Number: [id number] Patient Account Number: [id number] Date of Birth/Sex: Treating RN: 1928/03/03 (88 y.o. [name], [name] Primary Care Loreta Blouch: [name] Other Clinician: Referring Tykerria Mccubbins: Treating Tejal Monroy/Extender: [name] Weeks in Treatment: 27 Wound Status Wound Number: 8 Primary Trauma, Other Etiology: Wound Location: Left Knee Wound Open Wounding Event: Trauma Status: Date Acquired: 07/24/2022 Comorbid Cataracts, Anemia, Congestive Heart Failure, Hypotension, Weeks Of Treatment: 2 History: Peripheral Venous Disease, Osteomyelitis, Neuropathy Clustered Wound: No Photos Wound Measurements Length: (cm) 0.8 Width: (cm) 0.4 Depth: (cm) 0.1 Area: (cm) 0.251 Volume: (cm) 0.025 % Reduction in Area: 87.7% % Reduction in Volume: 87.7% Epithelialization: Large (67-100%) Tunneling: No Undermining: No Wound Description Classification: Full Thickness With Exposed Suppo Wound Margin: Distinct, outline attached Exudate Amount: None Present rt Structures Foul Odor After Cleansing: No Slough/Fibrino Yes Wound Bed Granulation Amount: Medium (34-66%) Exposed Structure Necrotic Amount: Medium (34-66%) Fascia Exposed: No Fat Layer (Subcutaneous Tissue) Exposed: No Tendon Exposed: No Muscle Exposed: No Colgan, [name] ([id number])  [id number] Page 10 of 11 Joint Exposed: No Bone Exposed: No Periwound Skin Texture Texture Color No Abnormalities Noted: No No Abnormalities Noted: No Callus: No Atrophie Blanche: No Crepitus: No Cyanosis: No Excoriation: No Ecchymosis: No Induration: No Erythema: No Rash: No Hemosiderin Staining: No Scarring: No Mottled: No Pallor: No Moisture Rubor: No No Abnormalities Noted: No Dry / Scaly: No Temperature / Pain Maceration: No Temperature: No Abnormality Tenderness on Palpation: Yes Treatment Notes Wound #8 (Knee) Wound Laterality: Left Cleanser Soap and Water Discharge Instruction: May shower and wash wound with dial antibacterial soap and water prior to dressing change. Wound Cleanser Discharge Instruction: Cleanse the wound with wound cleanser prior to applying a clean dressing using gauze sponges, not tissue or cotton balls. Peri-Wound Care Topical Primary Dressing MediHoney Gel, tube 1.5 (oz) Discharge Instruction: Apply to wound bed as instructed Hydrofera Blue Ready Foam, 4x5 in Discharge Instruction: Apply to wound bed as instructed Secondary Dressing Woven Gauze Sponges 2x2 in Discharge Instruction: Apply over primary dressing. may tape down with medipore tape. Zetuvit Plus Silicone Border Dressing 4x4 (in/in) Discharge Instruction: Apply silicone border over primary dressing as directed. Secured With Compression Wrap Compression Stockings Facilities manager) Signed: 08/07/2022 6:11:43 PM By: Megan Stall RN, BSN Entered By: [name] on 08/07/2022 15:02:49 -------------------------------------------------------------------------------- Vitals Details Patient Name: Date of Service: [name]. 08/07/2022 2:30 PM Medical Record Number: [id number] Patient Account Number: [id number] Date of Birth/Sex: Treating RN: [birth year] (88 y.o. [name] Primary Care Sherine Cortese: [name] Other  Clinician: Referring Merrell Rettinger: Treating Leilanni Halvorson/Extender: [name] Weeks in Treatment: [address] [name] ([id number]) [id number] Page 11 of 11 Time Taken: 14:55 Temperature (F): 98.2 Height (in): 62 Pulse (bpm): 64 Weight (lbs): 181 Respiratory Rate (breaths/min): 18 Body Mass Index (BMI): 33.1 Blood Pressure (mmHg): 125/66 Reference Range: 80 - 120 mg / dl Electronic Signature(s) Signed: 08/07/2022 6:11:43 PM By: Megan Stall RN, BSN Entered By: [name] on 08/07/2022 14:59:30

## 2022-08-08 NOTE — Progress Notes (Signed)
[name] ([id number]) [id number] Page 1 of 9 Visit Report for 08/07/2022 Chief Complaint Document Details Patient Name: Date of Service: [name], [name]. 08/07/2022 2:30 PM Medical Record Number: [id number] Patient Account Number: [id number] Date of Birth/Sex: Treating RN: 1928/03/09 (88 y.o. F) Primary Care Provider: [name] Other Clinician: Referring Provider: Treating Provider/Extender: Noreene Larsson Weeks in Treatment: 27 Information Obtained from: Patient Chief Complaint 01/28/2022; right second toe amputation site dehiscence and bilateral lower extremity wounds. Electronic Signature(s) Signed: 08/07/2022 5:09:59 PM By: Geralyn Corwin DO Entered By: Geralyn Corwin on 08/07/2022 16:39:21 -------------------------------------------------------------------------------- HPI Details Patient Name: Date of Service: [name]. 08/07/2022 2:30 PM Medical Record Number: [id number] Patient Account Number: [id number] Date of Birth/Sex: Treating RN: 08-23-1928 (88 y.o. F) Primary Care Provider: [name] Other Clinician: Referring Provider: Treating Provider/Extender: Noreene Larsson Weeks in Treatment: 27 History of Present Illness HPI Description: Admission 01/28/2022 [name] is a 88 year old female with a past medical history of idiopathic peripheral neuropathy status post amputation to the second right toe secondary to osteomyelitis, COPD and A-fib on Coumadin the presents to the clinic for a 82-month history of nonhealing ulcer to a previous amputation site on the second right toe. She states she has tried Medihoney and silver alginate in the past to this area with little benefit. She also has 2 small areas limited to skin breakdown to her lower extremities bilaterally. She has chronic venous insufficiency but not has not been wearing her compression stockings. She states she bumped her legs against  an object and not so the wound started. She has been using Medihoney to the sites. She denies signs of infection. 6/2; patient presents for follow-up. She had an x-ray of her right foot done at last clinic visit and this was negative for evidence of osteomyelitis. She also had a wound culture done that showed extra high levels of Staph aureus. I recommended Keystone antibiotics for this and this was ordered. She had ABIs with TBI's done as well that showed monophasic waveforms to the right foot with TBI of 0 and an ABI of 0.52. Urgent referral was made to vein and vascular and she saw Dr. Durwin Nora on 6/1, yesterday and he recommended an arteriogram. This is scheduled for 6/16. Patient also reports a new wound to the right great toe. This is a blister that has ruptured. She also reports increased erythema to the toe. 6/6; the patient was worked in urgently today at the insistence of her daughter out of concern for a new wound on the lateral part of the plantar right great toe. She has her original postsurgical wound after the amputation of the right second toe, she has a wound on the medial part of the right great toe. The patient is apparently going for an angiogram by Dr. Durwin Nora in 2 weeks time. 6/13; patient presents for follow-up. She has been using bacitracin to the abrasion on the right great toe. She has been using collagen and Keystone antibiotic to the amputation site. She has no issues or complaints today. She denies signs of infection. 6/22; patient presents for follow-up. She states that her abdominal aortogram was canceled due to her renal function. She has been using Keystone antibiotics to the amputation site and Medihoney to the right great toe wound. At the pace of the right great toe she has a slitlike open area that she thinks was caused by the tape from the dressing. 6/29; patient presents for follow-up. She has  been using Keystone antibiotics to the amputation site along with collagen.  She has been using Medihoney to the right great toe wound. She has no other wounds. She denies signs of infection. 7/13; patient presents for follow-up. She has been using Keystone antibiotics and collagen to the wound sites. She currently denies signs of infection. She states she is scheduled to see me nephrology next month. [name] ([id number]) [id number] Page 2 of 9 7/20; patient presents for follow-up. She continue Keystone antibiotics and collagen to the wound sites. She has no issues or complaints today. 8/1; patient presents for follow up. She continues to use keystone antibiotics and collagen to the wound sites. She has no issues or complaints today. 8/15; patient presents for follow-up. She has been using Keystone antibiotics and collagen to the wound sites. She followed up with her nephrologist who made medication changes. She is supposed to get a repeat BMP in 2 weeks. Her decrease in renal function was a limiting factor in obtaining an arteriogram for potential intervention for revascularization. She currently denies signs of infection. 9/2; patient presents for follow-up. She has been using Keystone antibiotic and collagen to the wound beds. She has no issues or complaints today. Reports there has been improvement in kidney function however not cleared to have her arteriogram just yet. 10/10; patient presents for follow-up. She has been using Keystone antibiotic and collagen to the wound beds. She reports 2 new wounds 1 to the anterior right lower extremity and another to the plantar aspect of the right foot. She states that the right plantar foot wound was caused by the home health nurse changing the dressing and causing a skin tear. She is not sure how the right anterior leg wound started. It appears to be from trauma. She denies signs of infection. 10/19; patient presents for follow-up. She has been using Keystone antibiotic and collagen to the right  great toe wound. She is been using silver alginate to the right anterior and right plantar foot wound. She has been using Tubigrip to the right lower extremity. The plantar foot wound has healed. She has no issues or complaints today. 11/3; since the patient was last here she was seen in urgent care apparently for an area on the dorsal aspect of the right fifth toe perhaps over the PIP. I saw a picture of this on the daughter's cell phone. There was slough on this. Urgent care gave him doxycycline. She is also changed the dressing to all wounds back the [facility] and collagen which includes her right leg and left first toe 11/16; patient presents for follow-up. She has a new wound to the left knee. She states she fell. She has been using antibiotic ointment and collagen to this area. She has been using collagen and Keystone antibiotic to the right great toe wound. The anterior right leg wound is healed. She denies signs of infection. 11/30; patient presents for follow-up. The right great toe wound has healed. She has 1 remaining wound to the left knee. She has been using Hydrofera Blue and Medihoney here. Electronic Signature(s) Signed: 08/07/2022 5:09:59 PM By: Geralyn Corwin DO Entered By: Geralyn Corwin on 08/07/2022 16:40:10 -------------------------------------------------------------------------------- Physical Exam Details Patient Name: Date of Service: [name]. 08/07/2022 2:30 PM Medical Record Number: [id number] Patient Account Number: [id number] Date of Birth/Sex: Treating RN: 06-17-1928 (88 y.o. F) Primary Care Provider: [name] Other Clinician: Referring Provider: Treating Provider/Extender: Noreene Larsson Weeks in Treatment: 27 Constitutional respirations regular, non-labored  and within target range for patient.. Cardiovascular 2+ dorsalis pedis/posterior tibialis pulses. Psychiatric pleasant and cooperative. Notes T the right great toe there  is epithelization and small scab to previous wound site. o T the left knee there is a small open wound with granulation tissue and scant nonviable tissue. No surrounding signs of infection today at the wound beds. o Electronic Signature(s) Signed: 08/07/2022 5:09:59 PM By: Geralyn CorwinHoffman, Earnestine Tuohey DO Entered By: Geralyn CorwinHoffman, Sirron Francesconi on 08/07/2022 16:40:50 -------------------------------------------------------------------------------- Physician Orders Details Patient Name: Date of Service: Megan ReapHILL, Megan TTIE [name]. 08/07/2022 2:30 PM Medical Record Number: [id number] Patient Account Number: [id number] Megan MorrowHILL, Danamarie [name] ([id number]) [id number] Page 3 of 9 Date of Birth/Sex: Treating RN: 11/09/1927 (88 y.o. Toniann FailF) Breedlove, [name] Primary Care Provider: Other Clinician: Jorge NyHayes, Romana Referring Provider: Treating Provider/Extender: Noreene LarssonHoffman, Swayze Pries Hayes, Romana Weeks in Treatment: 2127 Verbal / Phone Orders: No Diagnosis Coding Follow-up Appointments ppointment in 2 weeks. - w/ Dr. Mikey BussingHOffman Return A Anesthetic (In clinic) Topical Lidocaine 5% applied to wound bed Edema Control - Lymphedema / SCD / Other Elevate legs to the level of the heart or above for 30 minutes daily and/or when sitting, a frequency of: - throughout the day. Avoid standing for long periods of time. Moisturize legs daily. - every night before bed. Other Edema Control Orders/Instructions: - tubigrip size E to right leg apply in the morning and remove at night. Off-Loading Open toe surgical shoe to: - while sitting and standing. Wound Treatment Wound #8 - Knee Wound Laterality: Left Cleanser: Soap and Water (Home Health) 1 x Per Day/30 Days Discharge Instructions: May shower and wash wound with dial antibacterial soap and water prior to dressing change. Cleanser: Wound Cleanser (Home Health) 1 x Per Day/30 Days Discharge Instructions: Cleanse the wound with wound cleanser prior to applying a clean dressing using  gauze sponges, not tissue or cotton balls. Prim Dressing: MediHoney Gel, tube 1.5 (oz) (Home Health) 1 x Per Day/30 Days ary Discharge Instructions: Apply to wound bed as instructed Prim Dressing: Hydrofera Blue Ready Foam, 4x5 in (DME) (Home Health) (Generic) 1 x Per Day/30 Days ary Discharge Instructions: Apply to wound bed as instructed Secondary Dressing: Woven Gauze Sponges 2x2 in (Home Health) 1 x Per Day/30 Days Discharge Instructions: Apply over primary dressing. may tape down with medipore tape. Secondary Dressing: Zetuvit Plus Silicone Border Dressing 4x4 (in/in) (DME) (Home Health) (Generic) 1 x Per Day/30 Days Discharge Instructions: Apply silicone border over primary dressing as directed. Electronic Signature(s) Signed: 08/07/2022 5:09:59 PM By: Geralyn CorwinHoffman, Odyssey Vasbinder DO Entered By: Geralyn CorwinHoffman, Robyn Galati on 08/07/2022 16:40:58 -------------------------------------------------------------------------------- Problem List Details Patient Name: Date of Service: Megan ReapHILL, Megan TTIE [name]. 08/07/2022 2:30 PM Medical Record Number: [id number] Patient Account Number: [id number] Date of Birth/Sex: Treating RN: 12/22/1927 (88 y.o. F) Primary Care Provider: Jorge NyHayes, Romana Other Clinician: Referring Provider: Treating Provider/Extender: Noreene LarssonHoffman, Jerris Fleer Hayes, Romana Weeks in Treatment: 27 Active Problems ICD-10 Encounter Code Description Active Date MDM Diagnosis L97.512 Non-pressure chronic ulcer of other part of right foot with fat layer exposed 01/28/2022 No Yes Simonich, Ikea [name] ([id number]) [id number] Page 4 of 9 G90.09 Other idiopathic peripheral autonomic neuropathy 01/28/2022 No Yes M86.9 Osteomyelitis, unspecified 01/28/2022 No Yes I48.91 Unspecified atrial fibrillation 01/28/2022 No Yes S81.801A Unspecified open wound, right lower leg, initial encounter 06/17/2022 No Yes Z79.01 Long term (current) use of anticoagulants 01/28/2022 No Yes I89.0 Lymphedema, not elsewhere  classified 01/28/2022 No Yes I87.2 Venous insufficiency (chronic) (peripheral) 01/28/2022 No Yes J44.9 Chronic obstructive pulmonary disease, unspecified 01/28/2022 No Yes I73.9 Peripheral vascular disease,  unspecified 03/27/2022 No Yes S81.802A Unspecified open wound, left lower leg, initial encounter 07/24/2022 No Yes T79.8XXA Other early complications of trauma, initial encounter 07/24/2022 No Yes Inactive Problems ICD-10 Code Description Active Date Inactive Date L97.812 Non-pressure chronic ulcer of other part of right lower leg with fat layer exposed 01/28/2022 01/28/2022 Resolved Problems ICD-10 Code Description Active Date Resolved Date L97.822 Non-pressure chronic ulcer of other part of left lower leg with fat layer exposed 01/28/2022 01/28/2022 Electronic Signature(s) Signed: 08/07/2022 5:09:59 PM By: Geralyn Corwin DO Entered By: Geralyn Corwin on 08/07/2022 16:37:13 Progress Note Details -------------------------------------------------------------------------------- [name] ([id number]) [id number] Page 5 of 9 Patient Name: Date of Service: Megan, [name] 08/07/2022 2:30 PM Medical Record Number: [id number] Patient Account Number: [id number] Date of Birth/Sex: Treating RN: 1928/06/16 (88 y.o. F) Primary Care Provider: [name] Other Clinician: Referring Provider: Treating Provider/Extender: Noreene Larsson Weeks in Treatment: 27 Subjective Chief Complaint Information obtained from Patient 01/28/2022; right second toe amputation site dehiscence and bilateral lower extremity wounds. History of Present Illness (HPI) Admission 01/28/2022 Ms. Soua Lenk is a 88 year old female with a past medical history of idiopathic peripheral neuropathy status post amputation to the second right toe secondary to osteomyelitis, COPD and A-fib on Coumadin the presents to the clinic for a 59-month history of nonhealing ulcer to a previous  amputation site on the second right toe. She states she has tried Medihoney and silver alginate in the past to this area with little benefit. She also has 2 small areas limited to skin breakdown to her lower extremities bilaterally. She has chronic venous insufficiency but not has not been wearing her compression stockings. She states she bumped her legs against an object and not so the wound started. She has been using Medihoney to the sites. She denies signs of infection. 6/2; patient presents for follow-up. She had an x-ray of her right foot done at last clinic visit and this was negative for evidence of osteomyelitis. She also had a wound culture done that showed extra high levels of Staph aureus. I recommended Keystone antibiotics for this and this was ordered. She had ABIs with TBI's done as well that showed monophasic waveforms to the right foot with TBI of 0 and an ABI of 0.52. Urgent referral was made to vein and vascular and she saw Dr. Durwin Nora on 6/1, yesterday and he recommended an arteriogram. This is scheduled for 6/16. Patient also reports a new wound to the right great toe. This is a blister that has ruptured. She also reports increased erythema to the toe. 6/6; the patient was worked in urgently today at the insistence of her daughter out of concern for a new wound on the lateral part of the plantar right great toe. She has her original postsurgical wound after the amputation of the right second toe, she has a wound on the medial part of the right great toe. The patient is apparently going for an angiogram by Dr. Durwin Nora in 2 weeks time. 6/13; patient presents for follow-up. She has been using bacitracin to the abrasion on the right great toe. She has been using collagen and Keystone antibiotic to the amputation site. She has no issues or complaints today. She denies signs of infection. 6/22; patient presents for follow-up. She states that her abdominal aortogram was canceled due to her  renal function. She has been using Keystone antibiotics to the amputation site and Medihoney to the right great toe wound. At the pace of the right great  toe she has a slitlike open area that she thinks was caused by the tape from the dressing. 6/29; patient presents for follow-up. She has been using Keystone antibiotics to the amputation site along with collagen. She has been using Medihoney to the right great toe wound. She has no other wounds. She denies signs of infection. 7/13; patient presents for follow-up. She has been using Keystone antibiotics and collagen to the wound sites. She currently denies signs of infection. She states she is scheduled to see me nephrology next month. 7/20; patient presents for follow-up. She continue Keystone antibiotics and collagen to the wound sites. She has no issues or complaints today. 8/1; patient presents for follow up. She continues to use keystone antibiotics and collagen to the wound sites. She has no issues or complaints today. 8/15; patient presents for follow-up. She has been using Keystone antibiotics and collagen to the wound sites. She followed up with her nephrologist who made medication changes. She is supposed to get a repeat BMP in 2 weeks. Her decrease in renal function was a limiting factor in obtaining an arteriogram for potential intervention for revascularization. She currently denies signs of infection. 9/2; patient presents for follow-up. She has been using Keystone antibiotic and collagen to the wound beds. She has no issues or complaints today. Reports there has been improvement in kidney function however not cleared to have her arteriogram just yet. 10/10; patient presents for follow-up. She has been using Keystone antibiotic and collagen to the wound beds. She reports 2 new wounds 1 to the anterior right lower extremity and another to the plantar aspect of the right foot. She states that the right plantar foot wound was caused by the  home health nurse changing the dressing and causing a skin tear. She is not sure how the right anterior leg wound started. It appears to be from trauma. She denies signs of infection. 10/19; patient presents for follow-up. She has been using Keystone antibiotic and collagen to the right great toe wound. She is been using silver alginate to the right anterior and right plantar foot wound. She has been using Tubigrip to the right lower extremity. The plantar foot wound has healed. She has no issues or complaints today. 11/3; since the patient was last here she was seen in urgent care apparently for an area on the dorsal aspect of the right fifth toe perhaps over the PIP. I saw a picture of this on the daughter's cell phone. There was slough on this. Urgent care gave him doxycycline. She is also changed the dressing to all wounds back the [facility] and collagen which includes her right leg and left first toe 11/16; patient presents for follow-up. She has a new wound to the left knee. She states she fell. She has been using antibiotic ointment and collagen to this area. She has been using collagen and Keystone antibiotic to the right great toe wound. The anterior right leg wound is healed. She denies signs of infection. 11/30; patient presents for follow-up. The right great toe wound has healed. She has 1 remaining wound to the left knee. She has been using Hydrofera Blue and Medihoney here. Patient History Information obtained from Patient, Caregiver. Family History Cancer - Siblings, Heart Disease, Stroke - Mother, No family history of Diabetes. Social History Former smoker - quit 2003, Marital Status - Widowed, Alcohol Use - Never, Drug Use - No History, Caffeine Use - Never. Medical History Eyes Patient has history of Cataracts Hematologic/Lymphatic [name], [name] (  [id number]) [id number] Page 6 of 9 Patient has history of Anemia Respiratory Denies history of  Chronic Obstructive Pulmonary Disease (COPD) Cardiovascular Patient has history of Congestive Heart Failure, Hypotension, Peripheral Venous Disease Musculoskeletal Patient has history of 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 Neurologic Patient has history of Neuropathy Medical A Surgical History Notes nd Hematologic/Lymphatic Thrombocytopenia Endocrine Hyperthyroidism, Hypothyroidism Objective Constitutional respirations regular, non-labored and within target range for patient.. Vitals Time Taken: 2:55 PM, Height: 62 in, Weight: 181 lbs, BMI: 33.1, Temperature: 98.2 F, Pulse: 64 bpm, Respiratory Rate: 18 breaths/min, Blood Pressure: 125/66 mmHg. Cardiovascular 2+ dorsalis pedis/posterior tibialis pulses. Psychiatric pleasant and cooperative. General Notes: T the right great toe there is epithelization and small scab to previous wound site. T the left knee there is a small open wound with granulation o o tissue and scant nonviable tissue. No surrounding signs of infection today at the wound beds. Integumentary (Hair, Skin) Wound #5 status is Open. Original cause of wound was Shear/Friction. The date acquired was: 02/06/2022. The wound has been in treatment 25 weeks. The wound is located on the Right T Great. The wound measures 0cm length x 0cm width x 0cm depth; 0cm^2 area and 0cm^3 volume. There is Fat Layer oe (Subcutaneous Tissue) exposed. There is no tunneling or undermining noted. There is a none present amount of drainage noted. The wound margin is distinct with the outline attached to the wound base. There is no granulation within the wound bed. There is no necrotic tissue within the wound bed. The periwound skin appearance exhibited: Callus. The periwound skin appearance did not exhibit: Crepitus, Excoriation, Induration, Rash, Scarring, Dry/Scaly, Maceration, Atrophie Blanche, Cyanosis, Ecchymosis, Hemosiderin Staining, Mottled, Pallor, Rubor, Erythema. Wound #8  status is Open. Original cause of wound was Trauma. The date acquired was: 07/24/2022. The wound has been in treatment 2 weeks. The wound is located on the Left Knee. The wound measures 0.8cm length x 0.4cm width x 0.1cm depth; 0.251cm^2 area and 0.025cm^3 volume. There is no tunneling or undermining noted. There is a none present amount of drainage noted. The wound margin is distinct with the outline attached to the wound base. There is medium (34-66%) granulation within the wound bed. There is a medium (34-66%) amount of necrotic tissue within the wound bed. The periwound skin appearance did not exhibit: Callus, Crepitus, Excoriation, Induration, Rash, Scarring, Dry/Scaly, Maceration, Atrophie Blanche, Cyanosis, Ecchymosis, Hemosiderin Staining, Mottled, Pallor, Rubor, Erythema. Periwound temperature was noted as No Abnormality. The periwound has tenderness on palpation. 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 idiopathic peripheral autonomic neuropathy Osteomyelitis, unspecified Unspecified atrial fibrillation Unspecified open wound, right lower leg, initial encounter Long term (current) use of anticoagulants Lymphedema, not elsewhere classified Venous insufficiency (chronic) (peripheral) Chronic obstructive pulmonary disease, unspecified Peripheral vascular disease, unspecified Unspecified open wound, left lower leg, initial encounter Other early complications of trauma, initial encounter Patient has 1 remaining wound to the left knee. I recommended continuing Medihoney and Hydrofera Blue here. Continue with compression stockings daily. Follow-up in 2 weeks. [name], [name] ([id number]) [id number] Page 7 of 9 Plan Follow-up Appointments: Return Appointment in 2 weeks. - w/ Dr. Mikey Bussing Anesthetic: (In clinic) Topical Lidocaine 5% applied to wound bed Edema Control - Lymphedema / SCD  / Other: Elevate legs to the level of the heart or above for 30 minutes daily and/or when sitting, a frequency of: - throughout the day. Avoid standing for long periods of time.  Moisturize legs daily. - every night before bed. Other Edema Control Orders/Instructions: - tubigrip size E to right leg apply in the morning and remove at night. Off-Loading: Open toe surgical shoe to: - while sitting and standing. WOUND #8: - Knee Wound Laterality: Left Cleanser: Soap and Water (Home Health) 1 x Per Day/30 Days Discharge Instructions: May shower and wash wound with dial antibacterial soap and water prior to dressing change. Cleanser: Wound Cleanser (Home Health) 1 x Per Day/30 Days Discharge Instructions: Cleanse the wound with wound cleanser prior to applying a clean dressing using gauze sponges, not tissue or cotton balls. Prim Dressing: MediHoney Gel, tube 1.5 (oz) (Home Health) 1 x Per Day/30 Days ary Discharge Instructions: Apply to wound bed as instructed Prim Dressing: Hydrofera Blue Ready Foam, 4x5 in (DME) (Generic) 1 x Per Day/30 Days ary Discharge Instructions: Apply to wound bed as instructed Secondary Dressing: Woven Gauze Sponges 2x2 in (Home Health) 1 x Per Day/30 Days Discharge Instructions: Apply over primary dressing. may tape down with medipore tape. Secondary Dressing: Zetuvit Plus Silicone Border Dressing 4x4 (in/in) (DME) (Generic) 1 x Per Day/30 Days Discharge Instructions: Apply silicone border over primary dressing as directed. 1. Medihoney and Hydrofera Blue 2. Follow-up in 2 weeks Electronic Signature(s) Signed: 08/07/2022 5:09:59 PM By: Geralyn Corwin DO Entered By: Geralyn Corwin on 08/07/2022 16:42:36 -------------------------------------------------------------------------------- HxROS Details Patient Name: Date of Service: [name]. 08/07/2022 2:30 PM Medical Record Number: [id number] Patient Account Number: [id number] Date of Birth/Sex: Treating  RN: [birth year] (88 y.o. F) Primary Care Provider: [name] Other Clinician: Referring Provider: Treating Provider/Extender: Noreene Larsson Weeks in Treatment: 27 Information Obtained From Patient Caregiver Eyes Medical History: Positive for: Cataracts Hematologic/Lymphatic Medical History: Positive for: Anemia Past Medical History Notes: Thrombocytopenia Respiratory Medical History: Negative for: Chronic Obstructive Pulmonary Disease (COPD) Cardiovascular Medical History: Positive for: Congestive Heart Failure; Hypotension; Peripheral Venous Disease [name], [name] ([id number]) [id number] Page 8 of 9 Endocrine Medical History: Past Medical History Notes: Hyperthyroidism, Hypothyroidism Musculoskeletal Medical History: Positive for: 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 Neurologic Medical History: Positive for: Neuropathy HBO Extended History Items Eyes: Cataracts Immunizations Pneumococcal Vaccine: Received Pneumococcal Vaccination: No Implantable Devices None Family and Social History Cancer: Yes - Siblings; Diabetes: No; Heart Disease: Yes; Stroke: Yes - Mother; Former smoker - quit 2003; Marital Status - Widowed; Alcohol Use: Never; Drug Use: No History; Caffeine Use: Never; Financial Concerns: No; Food, Clothing or Shelter Needs: No; Support System Lacking: No; Transportation Concerns: No Electronic Signature(s) Signed: 08/07/2022 5:09:59 PM By: Geralyn Corwin DO Entered By: Geralyn Corwin on 08/07/2022 16:40:16 -------------------------------------------------------------------------------- SuperBill Details Patient Name: Date of Service: [name]. 08/07/2022 Medical Record Number: [id number] Patient Account Number: [id number] Date of Birth/Sex: Treating RN: 15-May-1928 (88 y.o. [name], [name] Primary Care Provider: [name] Other Clinician: Referring Provider: Treating  Provider/Extender: Noreene Larsson Weeks in Treatment: 27 Diagnosis Coding ICD-10 Codes Code Description [phone] Non-pressure chronic ulcer of other part of right foot with fat layer exposed G90.09 Other idiopathic peripheral autonomic neuropathy M86.9 Osteomyelitis, unspecified I48.91 Unspecified atrial fibrillation S81.801A Unspecified open wound, right lower leg, initial encounter Z79.01 Long term (current) use of anticoagulants I89.0 Lymphedema, not elsewhere classified I87.2 Venous insufficiency (chronic) (peripheral) J44.9 Chronic obstructive pulmonary disease, unspecified I73.9 Peripheral vascular disease, unspecified S81.802A Unspecified open wound, left lower leg, initial encounter T79.8XXA Other early complications of trauma, initial encounter [name], [name] ([id number]) [id number] Page 9 of 9 Facility Procedures : CPT4  Code: 71165790 Description: 99214 - WOUND CARE VISIT-LEV 4 EST PT Modifier: Quantity: 1 Physician Procedures : CPT4 Code Description Modifier [id number] 99213 - WC PHYS LEVEL 3 - EST PT ICD-10 Diagnosis Description S81.802A Unspecified open wound, left lower leg, initial encounter T79.8XXA Other early complications of trauma, initial encounter L97.512 Non-pressure  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

## 2022-08-26 ENCOUNTER — Encounter (HOSPITAL_BASED_OUTPATIENT_CLINIC_OR_DEPARTMENT_OTHER): Payer: Medicare HMO | Attending: Internal Medicine | Admitting: Internal Medicine

## 2022-08-26 DIAGNOSIS — J449 Chronic obstructive pulmonary disease, unspecified: Secondary | ICD-10-CM | POA: Diagnosis not present

## 2022-08-26 DIAGNOSIS — Z872 Personal history of diseases of the skin and subcutaneous tissue: Secondary | ICD-10-CM | POA: Insufficient documentation

## 2022-08-26 DIAGNOSIS — G9009 Other idiopathic peripheral autonomic neuropathy: Secondary | ICD-10-CM | POA: Insufficient documentation

## 2022-08-26 DIAGNOSIS — Z89421 Acquired absence of other right toe(s): Secondary | ICD-10-CM | POA: Diagnosis not present

## 2022-08-26 DIAGNOSIS — I890 Lymphedema, not elsewhere classified: Secondary | ICD-10-CM | POA: Diagnosis not present

## 2022-08-26 DIAGNOSIS — I4891 Unspecified atrial fibrillation: Secondary | ICD-10-CM | POA: Insufficient documentation

## 2022-08-26 DIAGNOSIS — I872 Venous insufficiency (chronic) (peripheral): Secondary | ICD-10-CM | POA: Insufficient documentation

## 2022-08-26 DIAGNOSIS — Z09 Encounter for follow-up examination after completed treatment for conditions other than malignant neoplasm: Secondary | ICD-10-CM | POA: Insufficient documentation

## 2022-08-26 DIAGNOSIS — Z7901 Long term (current) use of anticoagulants: Secondary | ICD-10-CM | POA: Diagnosis not present

## 2022-08-26 DIAGNOSIS — S81802A Unspecified open wound, left lower leg, initial encounter: Secondary | ICD-10-CM

## 2022-08-26 DIAGNOSIS — I739 Peripheral vascular disease, unspecified: Secondary | ICD-10-CM | POA: Insufficient documentation

## 2022-08-26 DIAGNOSIS — T798XXA Other early complications of trauma, initial encounter: Secondary | ICD-10-CM | POA: Diagnosis not present

## 2022-08-26 NOTE — Progress Notes (Signed)
Megan, [name] ([id number]) [id number] Page 1 of 8 Visit Report for 08/26/2022 Chief Complaint Document Details Patient Name: Date of Service: [name], [name] 08/26/2022 2:45 PM Medical Record Number: [id number] Patient Account Number: [id number] Date of Birth/Sex: Treating RN: 01/06/1928 (88 y.o. F) Primary Care Provider: Windy Carina Other Clinician: Referring Provider: Treating Provider/Extender: Veneda Melter Weeks in Treatment: 30 Information Obtained from: Patient Chief Complaint 01/28/2022; right second toe amputation site dehiscence and bilateral lower extremity wounds. Electronic Signature(s) Signed: 08/26/2022 3:21:24 PM By: Kalman Shan DO Entered By: Kalman Shan on 08/26/2022 15:17:24 -------------------------------------------------------------------------------- HPI Details Patient Name: Date of Service: [name]. 08/26/2022 2:45 PM Medical Record Number: [id number] Patient Account Number: [id number] Date of Birth/Sex: Treating RN: [birth year] (88 y.o. F) Primary Care Provider: Windy Carina Other Clinician: Referring Provider: Treating Provider/Extender: Veneda Melter Weeks in Treatment: 30 History of Present Illness HPI Description: Admission 01/28/2022 [name] is a 88 year old female with a past medical history of idiopathic peripheral neuropathy status post amputation to the second right toe secondary to osteomyelitis, COPD and A-fib on Coumadin the presents to the clinic for a 32-month history of nonhealing ulcer to a previous amputation site on the second right toe. She states she has tried Medihoney and silver alginate in the past to this area with little benefit. She also has 2 small areas limited to skin breakdown to her lower extremities bilaterally. She has chronic venous insufficiency but not has not been wearing her compression stockings. She states she bumped her legs against  an object and not so the wound started. She has been using Medihoney to the sites. She denies signs of infection. 6/2; patient presents for follow-up. She had an x-ray of her right foot done at last clinic visit and this was negative for evidence of osteomyelitis. She also had a wound culture done that showed extra high levels of Staph aureus. I recommended Keystone antibiotics for this and this was ordered. She had ABIs with TBI's done as well that showed monophasic waveforms to the right foot with TBI of 0 and an ABI of 0.52. Urgent referral was made to vein and vascular and she saw Dr. Doren Custard on 6/1, yesterday and he recommended an arteriogram. This is scheduled for 6/16. Patient also reports a new wound to the right great toe. This is a blister that has ruptured. She also reports increased erythema to the toe. 6/6; the patient was worked in urgently today at the insistence of her daughter out of concern for a new wound on the lateral part of the plantar right great toe. She has her original postsurgical wound after the amputation of the right second toe, she has a wound on the medial part of the right great toe. The patient is apparently going for an angiogram by Dr. Doren Custard in 2 weeks time. 6/13; patient presents for follow-up. She has been using bacitracin to the abrasion on the right great toe. She has been using collagen and Keystone antibiotic to the amputation site. She has no issues or complaints today. She denies signs of infection. 6/22; patient presents for follow-up. She states that her abdominal aortogram was canceled due to her renal function. She has been using Keystone antibiotics to the amputation site and Medihoney to the right great toe wound. At the pace of the right great toe she has a slitlike open area that she thinks was caused by the tape from the dressing. 6/29; patient presents for follow-up. She has  been using Keystone antibiotics to the amputation site along with collagen.  She has been using Medihoney to the right great toe wound. She has no other wounds. She denies signs of infection. 7/13; patient presents for follow-up. She has been using Keystone antibiotics and collagen to the wound sites. She currently denies signs of infection. She states she is scheduled to see me nephrology next month. Megan, [name] ([id number]) [id number] Page 2 of 8 7/20; patient presents for follow-up. She continue Keystone antibiotics and collagen to the wound sites. She has no issues or complaints today. 8/1; patient presents for follow up. She continues to use keystone antibiotics and collagen to the wound sites. She has no issues or complaints today. 8/15; patient presents for follow-up. She has been using Keystone antibiotics and collagen to the wound sites. She followed up with her nephrologist who made medication changes. She is supposed to get a repeat BMP in 2 weeks. Her decrease in renal function was a limiting factor in obtaining an arteriogram for potential intervention for revascularization. She currently denies signs of infection. 9/2; patient presents for follow-up. She has been using Keystone antibiotic and collagen to the wound beds. She has no issues or complaints today. Reports there has been improvement in kidney function however not cleared to have her arteriogram just yet. 10/10; patient presents for follow-up. She has been using Keystone antibiotic and collagen to the wound beds. She reports 2 new wounds 1 to the anterior right lower extremity and another to the plantar aspect of the right foot. She states that the right plantar foot wound was caused by the home health nurse changing the dressing and causing a skin tear. She is not sure how the right anterior leg wound started. It appears to be from trauma. She denies signs of infection. 10/19; patient presents for follow-up. She has been using Keystone antibiotic and collagen to the right  great toe wound. She is been using silver alginate to the right anterior and right plantar foot wound. She has been using Tubigrip to the right lower extremity. The plantar foot wound has healed. She has no issues or complaints today. 11/3; since the patient was last here she was seen in urgent care apparently for an area on the dorsal aspect of the right fifth toe perhaps over the PIP. I saw a picture of this on the daughter's cell phone. There was slough on this. Urgent care gave him doxycycline. She is also changed the dressing to all wounds back the [facility] and collagen which includes her right leg and left first toe 11/16; patient presents for follow-up. She has a new wound to the left knee. She states she fell. She has been using antibiotic ointment and collagen to this area. She has been using collagen and Keystone antibiotic to the right great toe wound. The anterior right leg wound is healed. She denies signs of infection. 11/30; patient presents for follow-up. The right great toe wound has healed. She has 1 remaining wound to the left knee. She has been using Hydrofera Blue and Medihoney here. 12/19; patient presents for follow-up. Her left knee wound has healed. She has no issues or complaints today. Electronic Signature(s) Signed: 08/26/2022 3:21:24 PM By: Kalman Shan DO Entered By: Kalman Shan on 08/26/2022 15:17:46 -------------------------------------------------------------------------------- Physical Exam Details Patient Name: Date of Service: [name]. 08/26/2022 2:45 PM Medical Record Number: [id number] Patient Account Number: [id number] Date of Birth/Sex: Treating RN: 05/13/1928 (88 y.o. F) Primary Care Provider: Windy Carina  Other Clinician: Referring Provider: Treating Provider/Extender: Lurlean Leyden, Lester [place] in Treatment: 30 Constitutional respirations regular, non-labored and within target range for patient.. Cardiovascular 2+  dorsalis pedis/posterior tibialis pulses. Psychiatric pleasant and cooperative. Notes T the left knee there is epithelization to the previous wound site. No open wounds to the lower extremities bilaterally. o Electronic Signature(s) Signed: 08/26/2022 3:21:24 PM By: Geralyn Corwin DO Entered By: Geralyn Corwin on 08/26/2022 15:18:15 -------------------------------------------------------------------------------- Physician Orders Details Patient Name: Date of Service: Longan, [name] 08/26/2022 2:45 PM [place]house, [name] ([id number]) [id number] Page 3 of 8 Medical Record Number: [id number] Patient Account Number: [id number] Date of Birth/Sex: Treating RN: 20-Dec-1927 (88 y.o. [name], [name] Primary Care Provider: [name] Other Clinician: Referring Provider: Treating Provider/Extender: Noreene Larsson Weeks in Treatment: 30 Verbal / Phone Orders: No Diagnosis Coding Discharge From [facility] Services Discharge from Wound Care Center Edema Control - Lymphedema / SCD / Other Elevate legs to the level of the heart or above for 30 minutes daily and/or when sitting, a frequency of: Avoid standing for long periods of time. Patient to wear own compression stockings every day. Electronic Signature(s) Signed: 08/26/2022 3:21:24 PM By: Geralyn Corwin DO Entered By: Geralyn Corwin on 08/26/2022 15:18:22 -------------------------------------------------------------------------------- Problem List Details Patient Name: Date of Service: [name]. 08/26/2022 2:45 PM Medical Record Number: [id number] Patient Account Number: [id number] Date of Birth/Sex: Treating RN: 1927/11/04 (88 y.o. F) Primary Care Provider: [name] Other Clinician: Referring Provider: Treating Provider/Extender: Noreene Larsson Weeks in Treatment: 30 Active Problems ICD-10 Encounter Code Description Active Date MDM Diagnosis L97.512  Non-pressure chronic ulcer of other part of right foot with fat layer exposed 01/28/2022 No Yes G90.09 Other idiopathic peripheral autonomic neuropathy 01/28/2022 No Yes M86.9 Osteomyelitis, unspecified 01/28/2022 No Yes I48.91 Unspecified atrial fibrillation 01/28/2022 No Yes S81.801A Unspecified open wound, right lower leg, initial encounter 06/17/2022 No Yes Z79.01 Long term (current) use of anticoagulants 01/28/2022 No Yes I89.0 Lymphedema, not elsewhere classified 01/28/2022 No Yes I87.2 Venous insufficiency (chronic) (peripheral) 01/28/2022 No Yes Hohn, Martita M ([id number]) [id number] Page 4 of 8 J44.9 Chronic obstructive pulmonary disease, unspecified 01/28/2022 No Yes I73.9 Peripheral vascular disease, unspecified 03/27/2022 No Yes S81.802A Unspecified open wound, left lower leg, initial encounter 07/24/2022 No Yes T79.8XXA Other early complications of trauma, initial encounter 07/24/2022 No Yes Inactive Problems ICD-10 Code Description Active Date Inactive Date L97.812 Non-pressure chronic ulcer of other part of right lower leg with fat layer exposed 01/28/2022 01/28/2022 Resolved Problems ICD-10 Code Description Active Date Resolved Date L97.822 Non-pressure chronic ulcer of other part of left lower leg with fat layer exposed 01/28/2022 01/28/2022 Electronic Signature(s) Signed: 08/26/2022 3:21:24 PM By: Geralyn Corwin DO Entered By: Geralyn Corwin on 08/26/2022 15:17:13 -------------------------------------------------------------------------------- Progress Note Details Patient Name: Date of Service: [name]. 08/26/2022 2:45 PM Medical Record Number: [id number] Patient Account Number: [id number] Date of Birth/Sex: Treating RN: 25-May-1928 (88 y.o. F) Primary Care Provider: [name] Other Clinician: Referring Provider: Treating Provider/Extender: Noreene Larsson Weeks in Treatment: 30 Subjective Chief Complaint Information  obtained from Patient 01/28/2022; right second toe amputation site dehiscence and bilateral lower extremity wounds. History of Present Illness (HPI) Admission 01/28/2022 Ms. Alpa Salvo is a 88 year old female with a past medical history of idiopathic peripheral neuropathy status post amputation to the second right toe secondary to osteomyelitis, COPD and A-fib on Coumadin the presents to the clinic for a 1-month history of nonhealing ulcer to a previous amputation site on  the second right toe. She states she has tried Medihoney and silver alginate in the past to this area with little benefit. She also has 2 small areas limited to skin breakdown to her lower extremities bilaterally. She has chronic venous insufficiency but not has not been wearing her compression stockings. She states she bumped her legs against an object and not so the wound started. She has been using Medihoney to the sites. She denies signs of infection. 6/2; patient presents for follow-up. She had an x-ray of her right foot done at last clinic visit and this was negative for evidence of osteomyelitis. She also had a wound culture done that showed extra high levels of Staph aureus. I recommended Keystone antibiotics for this and this was ordered. She had ABIs with TBI's done as well that showed monophasic waveforms to the right foot with TBI of 0 and an ABI of 0.52. Urgent referral was made to vein and vascular and she saw Dr. Doren Custard on 6/1, yesterday and he recommended an arteriogram. This is scheduled for 6/16. Patient also reports a new wound to the right great toe. This is a blister that has ruptured. She also reports increased erythema to the toe. 6/6; the patient was worked in urgently today at the insistence of her daughter out of concern for a new wound on the lateral part of the plantar right great toe. She has her original postsurgical wound after the amputation of the right second toe, she has a wound on the medial part of  the right great toe. [name], DOOD ([id number]) [id number] Page 5 of 8 The patient is apparently going for an angiogram by Dr. Doren Custard in 2 weeks time. 6/13; patient presents for follow-up. She has been using bacitracin to the abrasion on the right great toe. She has been using collagen and Keystone antibiotic to the amputation site. She has no issues or complaints today. She denies signs of infection. 6/22; patient presents for follow-up. She states that her abdominal aortogram was canceled due to her renal function. She has been using Keystone antibiotics to the amputation site and Medihoney to the right great toe wound. At the pace of the right great toe she has a slitlike open area that she thinks was caused by the tape from the dressing. 6/29; patient presents for follow-up. She has been using Keystone antibiotics to the amputation site along with collagen. She has been using Medihoney to the right great toe wound. She has no other wounds. She denies signs of infection. 7/13; patient presents for follow-up. She has been using Keystone antibiotics and collagen to the wound sites. She currently denies signs of infection. She states she is scheduled to see me nephrology next month. 7/20; patient presents for follow-up. She continue Keystone antibiotics and collagen to the wound sites. She has no issues or complaints today. 8/1; patient presents for follow up. She continues to use keystone antibiotics and collagen to the wound sites. She has no issues or complaints today. 8/15; patient presents for follow-up. She has been using Keystone antibiotics and collagen to the wound sites. She followed up with her nephrologist who made medication changes. She is supposed to get a repeat BMP in 2 weeks. Her decrease in renal function was a limiting factor in obtaining an arteriogram for potential intervention for revascularization. She currently denies signs of infection. 9/2;  patient presents for follow-up. She has been using Keystone antibiotic and collagen to the wound beds. She has no issues or complaints today.  Reports there has been improvement in kidney function however not cleared to have her arteriogram just yet. 10/10; patient presents for follow-up. She has been using Keystone antibiotic and collagen to the wound beds. She reports 2 new wounds 1 to the anterior right lower extremity and another to the plantar aspect of the right foot. She states that the right plantar foot wound was caused by the home health nurse changing the dressing and causing a skin tear. She is not sure how the right anterior leg wound started. It appears to be from trauma. She denies signs of infection. 10/19; patient presents for follow-up. She has been using Keystone antibiotic and collagen to the right great toe wound. She is been using silver alginate to the right anterior and right plantar foot wound. She has been using Tubigrip to the right lower extremity. The plantar foot wound has healed. She has no issues or complaints today. 11/3; since the patient was last here she was seen in urgent care apparently for an area on the dorsal aspect of the right fifth toe perhaps over the PIP. I saw a picture of this on the daughter's cell phone. There was slough on this. Urgent care gave him doxycycline. She is also changed the dressing to all wounds back the [facility] and collagen which includes her right leg and left first toe 11/16; patient presents for follow-up. She has a new wound to the left knee. She states she fell. She has been using antibiotic ointment and collagen to this area. She has been using collagen and Keystone antibiotic to the right great toe wound. The anterior right leg wound is healed. She denies signs of infection. 11/30; patient presents for follow-up. The right great toe wound has healed. She has 1 remaining wound to the left knee. She has been using Hydrofera Blue and  Medihoney here. 12/19; patient presents for follow-up. Her left knee wound has healed. She has no issues or complaints today. Patient History Information obtained from Patient, Caregiver. Family History Cancer - Siblings, Heart Disease, Stroke - Mother, No family history of Diabetes. Social History Former smoker - quit 2003, Marital Status - Widowed, Alcohol Use - Never, Drug Use - No History, Caffeine Use - Never. Medical History Eyes Patient has history of Cataracts Hematologic/Lymphatic Patient has history of Anemia Respiratory Denies history of Chronic Obstructive Pulmonary Disease (COPD) Cardiovascular Patient has history of Congestive Heart Failure, Hypotension, Peripheral Venous Disease Musculoskeletal Patient has history of 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 Neurologic Patient has history of Neuropathy Medical A Surgical History Notes nd Hematologic/Lymphatic Thrombocytopenia Endocrine Hyperthyroidism, Hypothyroidism Objective Constitutional Ficken, Jadalynn M ([id number]) [id number] Page 6 of 8 respirations regular, non-labored and within target range for patient.. Vitals Time Taken: 2:59 PM, Height: 62 in, Weight: 181 lbs, BMI: 33.1, Temperature: 98.5 F, Pulse: 65 bpm, Respiratory Rate: 17 breaths/min, Blood Pressure: 145/54 mmHg. Cardiovascular 2+ dorsalis pedis/posterior tibialis pulses. Psychiatric pleasant and cooperative. General Notes: T the left knee there is epithelization to the previous wound site. No open wounds to the lower extremities bilaterally. o Integumentary (Hair, Skin) Wound #8 status is Healed - Epithelialized. Original cause of wound was Trauma. The date acquired was: 07/24/2022. The wound has been in treatment 4 weeks. The wound is located on the Left Knee. The wound measures 0cm length x 0cm width x 0cm depth; 0cm^2 area and 0cm^3 volume. There is a none present amount of drainage  noted. Assessment Active Problems ICD-10 Non-pressure chronic ulcer of other part of  right foot with fat layer exposed Other idiopathic peripheral autonomic neuropathy Osteomyelitis, unspecified Unspecified atrial fibrillation Unspecified open wound, right lower leg, initial encounter Long term (current) use of anticoagulants Lymphedema, not elsewhere classified Venous insufficiency (chronic) (peripheral) Chronic obstructive pulmonary disease, unspecified Peripheral vascular disease, unspecified Unspecified open wound, left lower leg, initial encounter Other early complications of trauma, initial encounter Patient has done well with Medihoney and Hydrofera Blue. Her left knee wound has healed. She has no open wounds currently. She knows to call with any questions or concerns. She may follow-up as needed. Plan Discharge From [facility] Services: Discharge from Wound Care Center Edema Control - Lymphedema / SCD / Other: Elevate legs to the level of the heart or above for 30 minutes daily and/or when sitting, a frequency of: Avoid standing for long periods of time. Patient to wear own compression stockings every day. 1. Follow-up as needed 2. Discharge from clinic due to closed wound Electronic Signature(s) Signed: 08/26/2022 3:21:24 PM By: Geralyn Corwin DO Entered By: Geralyn Corwin on 08/26/2022 15:20:22 -------------------------------------------------------------------------------- HxROS Details Patient Name: Date of Service: [name]. 08/26/2022 2:45 PM Medical Record Number: [id number] Patient Account Number: [id number] Date of Birth/Sex: Treating RN: 1927-12-30 (88 y.o. F) Primary Care Provider: [name] Other Clinician: Referring Provider: Treating Provider/Extender: Noreene Larsson [place], [place] Judie Petit ([id number]) [id number] Page 7 of 8 Weeks in Treatment: 30 Information Obtained From Patient Caregiver Eyes Medical  History: Positive for: Cataracts Hematologic/Lymphatic Medical History: Positive for: Anemia Past Medical History Notes: Thrombocytopenia Respiratory Medical History: Negative for: Chronic Obstructive Pulmonary Disease (COPD) Cardiovascular Medical History: Positive for: Congestive Heart Failure; Hypotension; Peripheral Venous Disease Endocrine Medical History: Past Medical History Notes: Hyperthyroidism, Hypothyroidism Musculoskeletal Medical History: Positive for: 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 Neurologic Medical History: Positive for: Neuropathy HBO Extended History Items Eyes: Cataracts Immunizations Pneumococcal Vaccine: Received Pneumococcal Vaccination: No Implantable Devices None Family and Social History Cancer: Yes - Siblings; Diabetes: No; Heart Disease: Yes; Stroke: Yes - Mother; Former smoker - quit 2003; Marital Status - Widowed; Alcohol Use: Never; Drug Use: No History; Caffeine Use: Never; Financial Concerns: No; Food, Clothing or Shelter Needs: No; Support System Lacking: No; Transportation Concerns: No Electronic Signature(s) Signed: 08/26/2022 3:21:24 PM By: Geralyn Corwin DO Entered By: Geralyn Corwin on 08/26/2022 15:17:51 -------------------------------------------------------------------------------- SuperBill Details Patient Name: Date of Service: [name]. 08/26/2022 Medical Record Number: [id number] Patient Account Number: [id number] JACQUES, NICOLAY ([id number]) [id number] Page 8 of 8 Date of Birth/Sex: Treating RN: Sep 26, 1927 (88 y.o. [name], [name] Primary Care Provider: [name] Other Clinician: Referring Provider: Treating Provider/Extender: Noreene Larsson Weeks in Treatment: 30 Diagnosis Coding ICD-10 Codes Code Description [phone] Non-pressure chronic ulcer of other part of right foot with fat layer exposed G90.09 Other idiopathic peripheral autonomic  neuropathy M86.9 Osteomyelitis, unspecified I48.91 Unspecified atrial fibrillation S81.801A Unspecified open wound, right lower leg, initial encounter Z79.01 Long term (current) use of anticoagulants I89.0 Lymphedema, not elsewhere classified I87.2 Venous insufficiency (chronic) (peripheral) J44.9 Chronic obstructive pulmonary disease, unspecified I73.9 Peripheral vascular disease, unspecified S81.802A Unspecified open wound, left lower leg, initial encounter T79.8XXA Other early complications of trauma, initial encounter Facility Procedures : CPT4 Code: [id number] Description: 99213 - WOUND CARE VISIT-LEV 3 EST PT Modifier: Quantity: 1 Physician Procedures : CPT4 Code Description Modifier [id number] 99213 - WC PHYS LEVEL 3 - EST PT ICD-10 Diagnosis Description S81.802A Unspecified open wound, left lower leg, initial encounter T79.8XXA Other early complications of trauma, initial encounter I87.2  Venous  insufficiency (chronic) (peripheral) I73.9 Peripheral vascular disease, unspecified Quantity: 1 Electronic Signature(s) Signed: 08/26/2022 3:21:24 PM By: Kalman Shan DO Entered By: Kalman Shan on 08/26/2022 15:20:56

## 2022-08-28 ENCOUNTER — Encounter (HOSPITAL_BASED_OUTPATIENT_CLINIC_OR_DEPARTMENT_OTHER): Payer: Self-pay | Admitting: Cardiology

## 2022-08-28 ENCOUNTER — Ambulatory Visit (INDEPENDENT_AMBULATORY_CARE_PROVIDER_SITE_OTHER): Payer: Medicare HMO | Admitting: Cardiology

## 2022-08-28 VITALS — BP 136/64 | HR 55 | Ht 62.0 in | Wt 183.0 lb

## 2022-08-28 DIAGNOSIS — I10 Essential (primary) hypertension: Secondary | ICD-10-CM

## 2022-08-28 DIAGNOSIS — I5032 Chronic diastolic (congestive) heart failure: Secondary | ICD-10-CM | POA: Diagnosis not present

## 2022-08-28 DIAGNOSIS — I272 Pulmonary hypertension, unspecified: Secondary | ICD-10-CM

## 2022-08-28 DIAGNOSIS — D6869 Other thrombophilia: Secondary | ICD-10-CM | POA: Diagnosis not present

## 2022-08-28 DIAGNOSIS — I4821 Permanent atrial fibrillation: Secondary | ICD-10-CM | POA: Diagnosis not present

## 2022-08-28 DIAGNOSIS — Z7901 Long term (current) use of anticoagulants: Secondary | ICD-10-CM

## 2022-08-28 NOTE — Progress Notes (Signed)
Megan, [name] ([id number]) [id number] Page 1 of 7 Visit Report for 08/26/2022 Arrival Information Details Patient Name: Date of Service: [name], [name] 08/26/2022 2:45 PM Medical Record Number: [id number] Patient Account Number: [id number] Date of Birth/Sex: Treating RN: [birth year] (88 y.o. F) Primary Care Jovi Zavadil: [name] Other Clinician: Referring Belia Febo: Treating Arnold Depinto/Extender: Noreene Larsson Weeks in Treatment: 30 Visit Information History Since Last Visit Added or deleted any medications: No Patient Arrived: Wheel Chair Any new allergies or adverse reactions: No Arrival Time: 14:58 Had a fall or experienced change in No Accompanied By: daughter activities of daily living that may affect Transfer Assistance: None risk of falls: Patient Identification Verified: Yes Signs or symptoms of abuse/neglect since last visito No Secondary Verification Process Completed: Yes Hospitalized since last visit: No Patient Requires Transmission-Based Precautions: No Implantable device outside of the clinic excluding No Patient Has Alerts: Yes cellular tissue based products placed in the center Patient Alerts: Patient on Blood Thinner since last visit: Has Dressing in Place as Prescribed: Yes Pain Present Now: No Electronic Signature(s) Signed: 08/26/2022 3:33:43 PM By: Thayer Dallas Entered By: Thayer Dallas on 08/26/2022 14:58:37 -------------------------------------------------------------------------------- Clinic Level of Care Assessment Details Patient Name: Date of Service: Megan, [name] 08/26/2022 2:45 PM Medical Record Number: [id number] Patient Account Number: [id number] Date of Birth/Sex: Treating RN: [birth year] (88 y.o. [name], [name] Primary Care Flavius Repsher: [name] Other Clinician: Referring Ezmae Speers: Treating Tenicia Gural/Extender: Noreene Larsson Weeks in Treatment: 30 Clinic Level of Care  Assessment Items TOOL 4 Quantity Score X- 1 0 Use when only an EandM is performed on FOLLOW-UP visit ASSESSMENTS - Nursing Assessment / Reassessment X- 1 10 Reassessment of Co-morbidities (includes updates in patient status) X- 1 5 Reassessment of Adherence to Treatment Plan ASSESSMENTS - Wound and Skin A ssessment / Reassessment X - Simple Wound Assessment / Reassessment - one wound 1 5 []  - 0 Complex Wound Assessment / Reassessment - multiple wounds []  - 0 Dermatologic / Skin Assessment (not related to wound area) ASSESSMENTS - Focused Assessment X- 1 5 Circumferential Edema Measurements - multi extremities []  - 0 Nutritional Assessment / Counseling / Intervention VERDELLE, VALTIERRA (  .pdf Page 2 of 7 []  - 0 Lower Extremity Assessment (monofilament, tuning fork, pulses) []  - 0 Peripheral Arterial Disease Assessment (using hand held doppler) ASSESSMENTS - Ostomy and/or Continence Assessment and Care []  - 0 Incontinence Assessment and Management []  - 0 Ostomy Care Assessment and Management (repouching, etc.) PROCESS - Coordination of Care X - Simple Patient / Family Education for ongoing care 1 15 []  - 0 Complex (extensive) Patient / Family Education for ongoing care X- 1 10 Staff obtains Modena Morrow, Records, T Results / Process Orders est []  - 0 Staff telephones HHA, Nursing Homes / Clarify orders / etc []  - 0 Routine Transfer to another Facility (non-emergent condition) []  - 0 Routine Hospital Admission (non-emergent condition) []  - 0 New Admissions / [id number] / Ordering NPWT Apligraf, etc. , []  - 0 Emergency Hospital Admission (emergent condition) X- 1 10 Simple Discharge Coordination []  - 0 Complex (extensive) Discharge Coordination PROCESS - Special Needs []  - 0 Pediatric / Minor Patient Management []  - 0 Isolation Patient Management []  - 0 Hearing / Language / Visual special needs []  - 0 Assessment  of Community assistance (transportation, D/C planning, etc.) []  - 0 Additional assistance / Altered mentation []  - 0 Support Surface(s) Assessment (bed, cushion, seat, etc.) INTERVENTIONS - Wound Cleansing / Measurement X -  Simple Wound Cleansing - one wound 1 5 []  - 0 Complex Wound Cleansing - multiple wounds X- 1 5 Wound Imaging (photographs - any number of wounds) []  - 0 Wound Tracing (instead of photographs) X- 1 5 Simple Wound Measurement - one wound []  - 0 Complex Wound Measurement - multiple wounds INTERVENTIONS - Wound Dressings []  - 0 Small Wound Dressing one or multiple wounds []  - 0 Medium Wound Dressing one or multiple wounds []  - 0 Large Wound Dressing one or multiple wounds []  - 0 Application of Medications - topical []  - 0 Application of Medications - injection INTERVENTIONS - Miscellaneous []  - 0 External ear exam []  - 0 Specimen Collection (cultures, biopsies, blood, body fluids, etc.) []  - 0 Specimen(s) / Culture(s) sent or taken to Lab for analysis []  - 0 Patient Transfer (multiple staff / Nurse, adultHoyer Lift / Similar devices) []  - 0 Simple Staple / Suture removal (25 or less) []  - 0 Complex Staple / Suture removal (26 or more) []  - 0 Hypo / Hyperglycemic Management (close monitor of Blood Glucose) [name], [name] ([id number]) [id number] Page 3 of 7 []  - 0 Ankle / Brachial Index (ABI) - do not check if billed separately X- 1 5 Vital Signs Has the patient been seen at the hospital within the last three years: Yes Total Score: 80 Level Of Care: New/Established - Level 3 Electronic Signature(s) Signed: 08/27/2022 5:39:23 PM By: Fonnie MuBreedlove, [name] RN Entered By: Fonnie MuBreedlove, [name] on 08/26/2022 15:14:18 -------------------------------------------------------------------------------- Encounter Discharge Information Details Patient Name: Date of Service: Megan ReapHILL, [name] 08/26/2022 2:45 PM Medical Record Number: [id number] Patient  Account Number: [id number] Date of Birth/Sex: Treating RN: 08/18/1928 (88 y.o. Ardis RowanF) Breedlove, [name] Primary Care Jeananne Bedwell: Jorge NyHayes, Romana Other Clinician: Referring Carlyn Mullenbach: Treating Jaquilla Woodroof/Extender: Noreene LarssonHoffman, Jessica Hayes, Romana Weeks in Treatment: 30 Encounter Discharge Information Items Discharge Condition: Stable Ambulatory Status: Wheelchair Discharge Destination: Home Transportation: Private Auto Accompanied By: daughter Schedule Follow-up Appointment: Yes Clinical Summary of Care: Patient Declined Electronic Signature(s) Signed: 08/27/2022 5:39:23 PM By: Fonnie MuBreedlove, [name] RN Entered By: Fonnie MuBreedlove, [name] on 08/26/2022 15:18:28 -------------------------------------------------------------------------------- Lower Extremity Assessment Details Patient Name: Date of Service: Megan ReapHILL, [name] 08/26/2022 2:45 PM Medical Record Number: [id number] Patient Account Number: [id number] Date of Birth/Sex: Treating RN: 05/28/1928 (88 y.o. F) Primary Care Luay Balding: Jorge NyHayes, Romana Other Clinician: Referring Yetta Marceaux: Treating Khamila Bassinger/Extender: Noreene LarssonHoffman, Jessica Hayes, Romana Weeks in Treatment: 30 Edema Assessment Assessed: [Left: No] [Right: No] Edema: [Left: Ye] [Right: s] Calf Left: Right: Point of Measurement: From Medial Instep 44.7 cm Ankle Left: Right: Point of Measurement: From Medial Instep 24.4 cm Electronic Signature(s) Signed: 08/26/2022 3:33:43 PM By: Kathaleen Grinderick, Kimberly Navejas, Ginette PitmanHATTIE M ([id number]) [id number] Page 4 of 7 Signed: 08/26/2022 3:33:43 PM By: Thayer Dallasick, Kimberly Entered By: Thayer Dallasick, Kimberly on 08/26/2022 15:06:22 -------------------------------------------------------------------------------- Multi Wound Chart Details Patient Name: Date of Service: Megan ReapHILL, [name] 08/26/2022 2:45 PM Medical Record Number: [id number] Patient Account Number: [id number] Date of Birth/Sex: Treating RN: 01/24/1928 (88 y.o. F) Primary Care Kenan Moodie: Jorge NyHayes, Romana  Other Clinician: Referring Marina Boerner: Treating Izak Anding/Extender: Noreene LarssonHoffman, Jessica Hayes, Romana Weeks in Treatment: 30 Vital Signs Height(in): 62 Pulse(bpm): 65 Weight(lbs): 181 Blood Pressure(mmHg): 145/54 Body Mass Index(BMI): 33.1 Temperature(F): 98.5 Respiratory Rate(breaths/min): 17 [8:Photos: No Photos Left Knee Wound Location: Trauma Wounding Event: Trauma, Other Primary Etiology: 07/24/2022 Date Acquired: 4 Weeks of Treatment: Healed - Epithelialized Wound Status: No Wound Recurrence: 0x0x0 Measurements L x W x D (cm) 0 A (cm) : rea 0  Volume (cm) : 100.00% % Reduction in  A rea: 100.00% % Reduction in Volume: Full Thickness With Exposed Support Classification: Structures None Present Exudate Amount:] [N/A:N/A N/A N/A N/A N/A N/A N/A N/A N/A N/A N/A N/A N/A N/A N/A] Treatment Notes Wound #8 (Knee) Wound Laterality: Left Cleanser Peri-Wound Care Topical Primary Dressing Secondary Dressing Secured With Compression Wrap Compression Stockings Add-Ons Electronic Signature(s) Signed: 08/26/2022 3:21:24 PM By: Geralyn Corwin DO Entered By: Geralyn Corwin on 08/26/2022 15:17:18 [place]dale, [name] ([id number]) [id number] Page 5 of 7 -------------------------------------------------------------------------------- Multi-Disciplinary Care Plan Details Patient Name: Date of Service: ZAILYNN, BRANDEL 08/26/2022 2:45 PM Medical Record Number: [id number] Patient Account Number: [id number] Date of Birth/Sex: Treating RN: 04/18/1928 (88 y.o. [name], [name] Primary Care Kimberley Dastrup: [name] Other Clinician: Referring Kevonna Nolte: Treating Azalea Cedar/Extender: Noreene Larsson Weeks in Treatment: 30 Active Inactive Electronic Signature(s) Signed: 08/27/2022 5:39:23 PM By: Fonnie Mu RN Entered By: Fonnie Mu on 08/26/2022 15:12:26 -------------------------------------------------------------------------------- Pain  Assessment Details Patient Name: Date of Service: Megan Reap. 08/26/2022 2:45 PM Medical Record Number: [id number] Patient Account Number: [id number] Date of Birth/Sex: Treating RN: 1928-04-22 (88 y.o. F) Primary Care Taygen Acklin: [name] Other Clinician: Referring Cap Massi: Treating Wallace Cogliano/Extender: Noreene Larsson Weeks in Treatment: 30 Active Problems Location of Pain Severity and Description of Pain Patient Has Paino No Site Locations Pain Management and Medication Current Pain Management: Electronic Signature(s) Signed: 08/26/2022 3:33:43 PM By: Thayer Dallas Entered By: Thayer Dallas on 08/26/2022 15:01:13 Belcastro, [name] ([id number]) [id number] Page 6 of 7 -------------------------------------------------------------------------------- Patient/Caregiver Education Details Patient Name: Date of Service: PRESCILLA, MONGER 12/19/2023andnbsp2:45 PM Medical Record Number: [id number] Patient Account Number: [id number] Date of Birth/Gender: Treating RN: 10-Nov-1927 (88 y.o. Toniann Fail Primary Care Physician: [name] Other Clinician: Referring Physician: Treating Physician/Extender: Noreene Larsson Weeks in Treatment: 30 Education Assessment Education Provided To: Patient Education Topics Provided Wound/Skin Impairment: Methods: Explain/Verbal Responses: State content correctly Electronic Signature(s) Signed: 08/27/2022 5:39:23 PM By: Fonnie Mu RN Entered By: Fonnie Mu on 08/26/2022 15:12:40 -------------------------------------------------------------------------------- Wound Assessment Details Patient Name: Date of Service: Megan Reap. 08/26/2022 2:45 PM Medical Record Number: [id number] Patient Account Number: [id number] Date of Birth/Sex: Treating RN: 08/10/1928 (88 y.o. [name], [name] Primary Care Jacqualyn Sedgwick: [name] Other Clinician: Referring  Maximilliano Kersh: Treating Korrina Zern/Extender: Noreene Larsson Weeks in Treatment: 30 Wound Status Wound Number: 8 Primary Etiology: Trauma, Other Wound Location: Left Knee Wound Status: Healed - Epithelialized Wounding Event: Trauma Date Acquired: 07/24/2022 Weeks Of Treatment: 4 Clustered Wound: No Wound Measurements Length: (cm) Width: (cm) Depth: (cm) Area: (cm) Volume: (cm) 0 % Reduction in Area: 100% 0 % Reduction in Volume: 100% 0 0 0 Wound Description Classification: Full Thickness With Exposed Support Exudate Amount: None Present Structures Periwound Skin Texture Texture Color No Abnormalities Noted: No No Abnormalities Noted: No Moisture No Abnormalities Noted: No Bickle, Shawntay M ([id number]) [id number] Page 7 of 7 Treatment Notes Wound #8 (Knee) Wound Laterality: Left Cleanser Peri-Wound Care Topical Primary Dressing Secondary Dressing Secured With Compression Wrap Compression Stockings Add-Ons Electronic Signature(s) Signed: 08/27/2022 5:39:23 PM By: Fonnie Mu RN Entered By: Fonnie Mu on 08/26/2022 15:13:47 -------------------------------------------------------------------------------- Vitals Details Patient Name: Date of Service: Megan Reap. 08/26/2022 2:45 PM Medical Record Number: [id number] Patient Account Number: [id number] Date of Birth/Sex: Treating RN: July 14, 1928 (88 y.o. F) Primary Care Pleasant Britz: [name] Other Clinician: Referring Alveria Mcglaughlin: Treating Darcie Mellone/Extender: Noreene Larsson Weeks in Treatment: 30 Vital Signs Time Taken: 14:59 Temperature (F): 98.5 Height (in): 62 Pulse (  bpm): 65 Weight (lbs): 181 Respiratory Rate (breaths/min): 17 Body Mass Index (BMI): 33.1 Blood Pressure (mmHg): 145/54 Reference Range: 80 - 120 mg / dl Electronic Signature(s) Signed: 08/26/2022 3:33:43 PM By: Thayer Dallas Entered By: Thayer Dallas on 08/26/2022  15:01:05

## 2022-08-28 NOTE — Patient Instructions (Signed)
Medication Instructions:  Your physician recommends that you continue on your current medications as directed. Please refer to the Current Medication list given to you today.   *If you need a refill on your cardiac medications before your next appointment, please call your pharmacy*  Lab Work: NONE  Testing/Procedures: NONE  Follow-Up: At [facility], you and your health needs are our priority.  As part of our continuing mission to provide you with exceptional heart care, we have created designated Provider Care Teams.  These Care Teams include your primary Cardiologist (physician) and Advanced Practice Providers (APPs -  Physician Assistants and Nurse Practitioners) who all work together to provide you with the care you need, when you need it.  We recommend signing up for the patient portal called "MyChart".  Sign up information is provided on this After Visit Summary.  MyChart is used to connect with patients for Virtual Visits (Telemedicine).  Patients are able to view lab/test results, encounter notes, upcoming appointments, etc.  Non-urgent messages can be sent to your provider as well.   To learn more about what you can do with MyChart, go to [email or web address].    Your next appointment:   3 month(s)  The format for your next appointment:   In Person  Provider:   [name], MD    Important Information About Sugar

## 2022-08-28 NOTE — Progress Notes (Signed)
Cardiology Office Note:    Date:  08/28/2022   ID:  [name], DOB Sep 24, 1927, MRN [id number]  PCP:  Saintclair Halsted, FNP  Cardiologist:  Buford Dresser, MD PhD  Referring MD: Saintclair Halsted, FNP   CC: follow up  History of Present Illness:    [name] is a 88 y.o. female with a hx of atrial fibrillation on coumadin, essential hypertension, pulmonary hypertension, and chronic diastolic heart failure, here for follow up. Her initial consult (telemedicine) was 11/25/18.   Cardiac history: Previously followed by Dr. Tish Men at [facility] (notes under media tab). Per notes:  -echo 2014, lexiscan 2015 low risk,  -echo 2019 hyperdynamic EF, septal flattening, severe RV dilation, RVSP noted as critical (near systemic) pulmonary hypertension, severe biatrial enlargement, dilated coronary sinus ?persistent left SVC.  -Venous study 2019 with bilateral mild insufficiency -Echo 11/10/17 with hyperdynamic EF, septal flattening, severe RV dilation, severe TR, severe pulmonary hypertension (listed at 71 mmHg in note)  At her previous visit on 05/21/2022 She was accompanied by her daughter who provided the history. She reported that she has been okay. Her at home systolic blood pressures were around 108, 110, 112, 120's. If she is more worried about something it would read around 140. She is not sure the machine was accurate, as the nurse would come to check her BP and it would read around 120. Her stress level was increased due to a family member recently being diagnosed with parkinson's disease.   Today, her daughter reports that her weight has been very consistent. She finally finished her wound treatment yesterday. She does have a spot on her right calf that was weeping clear liquid slowly for a week and a half.  On the same leg she found some fluid that looked like water with a little blood in it from another little scrap on the calf. She also had a lot of swelling during that  time. Her daughter gave her the lasix medication for a few days and the swelling went down and it finally stopped weeping.   Her legs are still showing the edema but her daughter tries to make sure that if she has a a bad day where they swell a lot, she will try to make sure she is taking it easier, keeps her legs elevated, and gives her the lasix medication.   Her daughter has noticed that she seems to be very cold a lot of the time. She wanted to make sure that the metoprolol medication that she is on is not contributing to this. Her blood pressure at home has been well managed but the other day she had a blood pressure of 108/69. This was on a day where she was resting more. Her daughter wanted to confirm that this and the feeling cold were not an issue from the medication.   She is able to walk from the house to the car when they out, she does not constantly use the wheelchair.   She denies any palpitations, chest pain, shortness of breath. No lightheadedness, headaches, syncope, orthopnea, or PND.    Past Medical History:  Diagnosis Date   Anticoagulant long-term use    Arrhythmia    Arthritis    Atrial fibrillation (HCC)    Congestive heart failure (CHF) (HCC)    Dysphagia    Esophageal reflux    Essential hypertension    Hiatal hernia    Pulmonary embolus (HCC)    Thyroid disease  Past Surgical History:  Procedure Laterality Date   AMPUTATION TOE Right 11/17/2021   Procedure: RIGHT SECOND TOE AMPUTATION;  Surgeon: Wylene Simmer, MD;  Location: [facility];  Service: Orthopedics;  Laterality: Right;   COLONOSCOPY     ELBOW SURGERY     LAPAROSCOPIC HYSTERECTOMY      Current Medications: Current Outpatient Medications on File Prior to Visit  Medication Sig   acetaminophen (TYLENOL) 325 MG tablet Take 650 mg by mouth every 6 (six) hours as needed for moderate pain.   Apoaequorin (PREVAGEN PO) Take 1 capsule by mouth at bedtime.   bumetanide (BUMEX) 1 MG tablet 0.5 tablets (0.5  mg total) daily. Per Dr. Johnney Ou   carboxymethylcellulose (REFRESH PLUS) 0.5 % SOLN Place 1 drop into both eyes 2 (two) times daily.   cholecalciferol (VITAMIN D3) 25 MCG (1000 UT) tablet Take 1,000 Units by mouth daily.   clotrimazole-betamethasone (LOTRISONE) cream Apply 1 application. topically 2 (two) times daily as needed (rash/irritation).   diclofenac Sodium (VOLTAREN) 1 % GEL Apply 2 g topically 4 (four) times daily as needed (pain).   gabapentin (NEURONTIN) 300 MG capsule Take 300 mg by mouth 2 (two) times daily.    levothyroxine (SYNTHROID) 100 MCG tablet Take 100 mcg by mouth every morning.   losartan (COZAAR) 100 MG tablet Take 0.5 tablets by mouth daily.   Menthol, Topical Analgesic, (BIOFREEZE) 10 % CREA Apply 1 application. topically daily as needed (pain).   metoprolol succinate (TOPROL-XL) 50 MG 24 hr tablet TAKE 1 TABLET BY MOUTH EVERY DAY WITH OR IMMEDIATELY FOLLOWING A MEAL (Patient taking differently: Take 50 mg by mouth daily.)   Multiple Vitamins-Minerals (PRESERVISION/LUTEIN) CAPS Take 1 capsule by mouth 2 (two) times daily.    pantoprazole (PROTONIX) 40 MG tablet Take 40 mg by mouth daily.   senna-docusate (SENOKOT-S) 8.6-50 MG tablet Take 1 tablet by mouth 2 (two) times daily between meals as needed for moderate constipation or mild constipation.   spironolactone (ALDACTONE) 25 MG tablet TAKE 1 TABLET (25 MG TOTAL) BY MOUTH DAILY WITH BREAKFAST.   vitamin B-12 (CYANOCOBALAMIN) 1000 MCG tablet Take 1,000 mcg by mouth daily.   warfarin (COUMADIN) 1 MG tablet Take 1-2 mg by mouth See admin instructions. Take 17m by mouth on Tues, Thur, Saturday & Sunday, Then take  2 mg on Monday, Wednesday, Friday.   No current facility-administered medications on file prior to visit.     Allergies:   Amoxicillin, Azithromycin, Codeine, Erythromycin, Erythromycin ethylsuccinate, Green dyes, Iodine, Misc. sulfonamide containing compounds, Oxycodone, Oxycodone-acetaminophen, Penicillins,  Sulfasalazine, and Tramadol   Social History   Tobacco Use   Smoking status: Former    Packs/day: 1.00    Types: Cigarettes    Quit date: 10/02/2001    Years since quitting: 20.9   Smokeless tobacco: Never   Tobacco comments:    former smoker  Vaping Use   Vaping Use: Never used     Family History: The patient's family history includes Cancer in her brother; Stroke in her mother.  ROS:   Please see the history of present illness.   (+) Bilateral LE edema  Additional pertinent ROS otherwise unremarkable.  EKGs/Labs/Other Studies Reviewed:    The following studies were reviewed today:  Bilateral LE Arterial Doppler 01/30/2022: Summary:  Right: Limited visualization due to involuntary movement and swelling.  Elevated velocities in the proximal/mid superficial femoral artery  suggesting 30-49% stenosis. Probable tibial occlusive disease - Posterior  tibial artery appears occluded with  reconstitution of flow in  the distal calf. Peroneal artery unable to be  visualized.   Left: Limited visualization due to involuntary movement and swelling.  Elevated velocities in the proximal/mid superficial femoral artery  suggesting 50-74% stenosis. Probable tibial occlusive disease - Posterior  tibial artery appears occluded. Peroneal  artery unable to be visualized.   ABI Doppler 01/30/2022: Summary:  Right: Resting right ankle-brachial index indicates moderate right lower  extremity arterial disease.   Left: Resting left ankle-brachial index indicates mild left lower  extremity arterial disease. The left toe-brachial index is abnormal.    EKG:  EKG is personally reviewed.  08/28/2022: atrial fib at 55 bpm, rbbb  05/21/22: not ordered today 02/13/2022:  atrial fibrillation at 44 bpm, iRBBB 02/05/2021: Atrial fibrillation, Rate 54 bpm, iRBBB 01/05/20: Afib at 45 bpm  Recent Labs: 11/16/2021: ALT 18 11/21/2021: Magnesium 1.7; Platelets 195 02/21/2022: BUN 47; Creatinine, Ser 2.08;  Hemoglobin 12.2; Potassium 4.6; Sodium 136   Recent Lipid Panel No results found for: "CHOL", "TRIG", "HDL", "CHOLHDL", "VLDL", "LDLCALC", "LDLDIRECT"  Physical Exam:    VS:  BP 136/64 (BP Location: Right Arm, Patient Position: Sitting, Cuff Size: Large)   Pulse (!) 55   Ht $R'5\' 2"'Oo$  (1.575 m)   Wt 183 lb (83 kg)   BMI 33.47 kg/m     Wt Readings from Last 3 Encounters:  08/28/22 183 lb (83 kg)  05/21/22 182 lb 3.2 oz (82.6 kg)  02/21/22 181 lb (82.1 kg)   GEN: Well nourished, well developed in no acute distress, in wheelchair HEENT: Normal, moist mucous membranes NECK: No JVD CARDIAC:  slow, irregularly irregular rhythm, normal S1 and S2, no rubs or gallops.  2/6 systolic murmur. VASCULAR: Radial and DP pulses 2+ bilaterally. No carotid bruits RESPIRATORY:  Clear to auscultation without rales, wheezing or rhonchi  ABDOMEN: Soft, non-tender, non-distended MUSCULOSKELETAL: moves all 4 limbs independently SKIN: Warm and dry,  trace bilateral LE edema,  Erythematous/purplish skin discoloration of bilateral LE (R>L) NEUROLOGIC:  Alert and oriented x 3. No focal neuro deficits noted. PSYCHIATRIC:  Normal affect   ASSESSMENT:    1. Diastolic CHF, chronic ([place])   2. Permanent atrial fibrillation ([place])   3. Moderate to severe pulmonary hypertension ([employer])   4. Secondary hypercoagulable state ([place])   5. Essential hypertension   6. Anticoagulant long-term use    PLAN:    Peripheral edema 2/2 severe pulmonary hypertension and chronic diastolic heart failure -has stabilized significantly. Daughter very involved in her care. Weights and symptoms stable. -Continue bumex and spironolactone as noted -continue daily weights, leg elevation, compression stockings, salt avoidance -we have extensively discussed her pulmonary hypertension, see prior notes -overall leg swelling significantly improved since when I first met her. They feel there is a good balance of avoiding swelling and allowing her  to have good quality of life (diet, frequency of urination, etc).  Hypertension: -continue diuretics as above -metoprolol succinate, losartan range as below  If Blood pressure consistently >150/90 -->call me If blood pressure 110-159/70-90  --> no change to losartan If blood pressure consistently <110 -->stop losartan and call me  If heart rate ~60 bpm on average, no change to metoprolol If heart rate 50s --> cut to 1/2 pill (25 mg) once a day If heart rate consistently less than 50 --> stop metoprolol and call me   Atrial fibrillation, permanent Slow ventricular response -CHA2DS2/VAS=at least 5 -on coumadin, managed per PCP.  -see above re: metoprolol  Lower extremity wound Concern for significant PAD -following with Dr.  Scot Dock, managing conservatively -has been following with wound care  Plan for follow up: 3 months  Buford Dresser, MD, PhD, [facility]    Medication Adjustments/Labs and Tests Ordered: Current medicines are reviewed at length with the patient today.  Concerns regarding medicines are outlined above.   Orders Placed This Encounter  Procedures   EKG 12-Lead   No orders of the defined types were placed in this encounter.  Patient Instructions  Medication Instructions:  Your physician recommends that you continue on your current medications as directed. Please refer to the Current Medication list given to you today.   *If you need a refill on your cardiac medications before your next appointment, please call your pharmacy*  Lab Work: NONE  Testing/Procedures: NONE  Follow-Up: At [facility], you and your health needs are our priority.  As part of our continuing mission to provide you with exceptional heart care, we have created designated Provider Care Teams.  These Care Teams include your primary Cardiologist (physician) and Advanced Practice Providers (APPs -  Physician Assistants and Nurse Practitioners) who  all work together to provide you with the care you need, when you need it.  We recommend signing up for the patient portal called "MyChart".  Sign up information is provided on this After Visit Summary.  MyChart is used to connect with patients for Virtual Visits (Telemedicine).  Patients are able to view lab/test results, encounter notes, upcoming appointments, etc.  Non-urgent messages can be sent to your provider as well.   To learn more about what you can do with MyChart, go to [email or web address].    Your next appointment:   3 month(s)  The format for your next appointment:   In Person  Provider:   Buford Dresser, MD    Important Information About [place],acting as a scribe for Buford Dresser, MD.,have documented all relevant documentation on the behalf of Buford Dresser, MD,as directed by  Buford Dresser, MD while in the presence of Buford Dresser, MD.   I, Buford Dresser, MD, have reviewed all documentation for this visit. The documentation on 10/13/22 for the exam, diagnosis, procedures, and orders are all accurate and complete.   Signed, Buford Dresser, MD PhD 08/28/2022  [place]

## 2022-09-30 ENCOUNTER — Encounter (HOSPITAL_BASED_OUTPATIENT_CLINIC_OR_DEPARTMENT_OTHER): Payer: Medicare HMO | Attending: Internal Medicine | Admitting: Internal Medicine

## 2022-09-30 DIAGNOSIS — T8781 Dehiscence of amputation stump: Secondary | ICD-10-CM | POA: Insufficient documentation

## 2022-09-30 DIAGNOSIS — Z09 Encounter for follow-up examination after completed treatment for conditions other than malignant neoplasm: Secondary | ICD-10-CM | POA: Diagnosis not present

## 2022-09-30 DIAGNOSIS — I890 Lymphedema, not elsewhere classified: Secondary | ICD-10-CM | POA: Diagnosis not present

## 2022-09-30 DIAGNOSIS — Z7901 Long term (current) use of anticoagulants: Secondary | ICD-10-CM | POA: Diagnosis not present

## 2022-09-30 DIAGNOSIS — I739 Peripheral vascular disease, unspecified: Secondary | ICD-10-CM | POA: Insufficient documentation

## 2022-09-30 DIAGNOSIS — J449 Chronic obstructive pulmonary disease, unspecified: Secondary | ICD-10-CM | POA: Diagnosis not present

## 2022-09-30 DIAGNOSIS — Y835 Amputation of limb(s) as the cause of abnormal reaction of the patient, or of later complication, without mention of misadventure at the time of the procedure: Secondary | ICD-10-CM | POA: Diagnosis not present

## 2022-09-30 DIAGNOSIS — G9009 Other idiopathic peripheral autonomic neuropathy: Secondary | ICD-10-CM | POA: Insufficient documentation

## 2022-09-30 DIAGNOSIS — I4891 Unspecified atrial fibrillation: Secondary | ICD-10-CM | POA: Diagnosis not present

## 2022-09-30 DIAGNOSIS — I872 Venous insufficiency (chronic) (peripheral): Secondary | ICD-10-CM

## 2022-09-30 DIAGNOSIS — M869 Osteomyelitis, unspecified: Secondary | ICD-10-CM | POA: Diagnosis not present

## 2022-09-30 NOTE — Progress Notes (Signed)
[name], [name] ([id number]) [id number] Page 1 of 8 Visit Report for 09/30/2022 Chief Complaint Document Details Patient Name: Date of Service: Megan, [name] 09/30/2022 2:15 PM Medical Record Number: [id number] Patient Account Number: [id number] Date of Birth/Sex: Treating RN: [birth year] (89 y.o. F) Primary Care Provider: Windy Carina Other Clinician: Referring Provider: Treating Provider/Extender: Veneda Melter Weeks in Treatment: 43 Information Obtained from: Patient Chief Complaint 01/28/2022; right second toe amputation site dehiscence and bilateral lower extremity wounds. Electronic Signature(s) Signed: 09/30/2022 4:21:39 PM By: Kalman Shan DO Entered By: Kalman Shan on 09/30/2022 15:08:24 -------------------------------------------------------------------------------- HPI Details Patient Name: Date of Service: Megan Gu TTIE M. 09/30/2022 2:15 PM Medical Record Number: [id number] Patient Account Number: [id number] Date of Birth/Sex: Treating RN: April 11, 1928 (89 y.o. F) Primary Care Provider: Windy Carina Other Clinician: Referring Provider: Treating Provider/Extender: Veneda Melter Weeks in Treatment: 35 History of Present Illness HPI Description: Admission 01/28/2022 [name] is a 89 year old female with a past medical history of idiopathic peripheral neuropathy status post amputation to the second right toe secondary to osteomyelitis, COPD and A-fib on Coumadin the presents to the clinic for a 54-month history of nonhealing ulcer to a previous amputation site on the second right toe. She states she has tried Medihoney and silver alginate in the past to this area with little benefit. She also has 2 small areas limited to skin breakdown to her lower extremities bilaterally. She has chronic venous insufficiency but not has not been wearing her compression stockings. She states she bumped her legs against an  object and not so the wound started. She has been using Medihoney to the sites. She denies signs of infection. 6/2; patient presents for follow-up. She had an x-ray of her right foot done at last clinic visit and this was negative for evidence of osteomyelitis. She also had a wound culture done that showed extra high levels of Staph aureus. I recommended Keystone antibiotics for this and this was ordered. She had ABIs with TBI's done as well that showed monophasic waveforms to the right foot with TBI of 0 and an ABI of 0.52. Urgent referral was made to vein and vascular and she saw Dr. Doren Custard on 6/1, yesterday and he recommended an arteriogram. This is scheduled for 6/16. Patient also reports a new wound to the right great toe. This is a blister that has ruptured. She also reports increased erythema to the toe. 6/6; the patient was worked in urgently today at the insistence of her daughter out of concern for a new wound on the lateral part of the plantar right great toe. She has her original postsurgical wound after the amputation of the right second toe, she has a wound on the medial part of the right great toe. The patient is apparently going for an angiogram by Dr. Doren Custard in 2 weeks time. 6/13; patient presents for follow-up. She has been using bacitracin to the abrasion on the right great toe. She has been using collagen and Keystone antibiotic to the amputation site. She has no issues or complaints today. She denies signs of infection. 6/22; patient presents for follow-up. She states that her abdominal aortogram was canceled due to her renal function. She has been using Keystone antibiotics to the amputation site and Medihoney to the right great toe wound. At the pace of the right great toe she has a slitlike open area that she thinks was caused by the tape from the dressing. 6/29; patient presents for follow-up. She has  been using Keystone antibiotics to the amputation site along with collagen. She  has been using Medihoney to the right great toe wound. She has no other wounds. She denies signs of infection. 7/13; patient presents for follow-up. She has been using Keystone antibiotics and collagen to the wound sites. She currently denies signs of infection. She states she is scheduled to see me nephrology next month. [name], [name] ([id number]) [id number] Page 2 of 8 7/20; patient presents for follow-up. She continue Keystone antibiotics and collagen to the wound sites. She has no issues or complaints today. 8/1; patient presents for follow up. She continues to use keystone antibiotics and collagen to the wound sites. She has no issues or complaints today. 8/15; patient presents for follow-up. She has been using Keystone antibiotics and collagen to the wound sites. She followed up with her nephrologist who made medication changes. She is supposed to get a repeat BMP in 2 weeks. Her decrease in renal function was a limiting factor in obtaining an arteriogram for potential intervention for revascularization. She currently denies signs of infection. 9/2; patient presents for follow-up. She has been using Keystone antibiotic and collagen to the wound beds. She has no issues or complaints today. Reports there has been improvement in kidney function however not cleared to have her arteriogram just yet. 10/10; patient presents for follow-up. She has been using Keystone antibiotic and collagen to the wound beds. She reports 2 new wounds 1 to the anterior right lower extremity and another to the plantar aspect of the right foot. She states that the right plantar foot wound was caused by the home health nurse changing the dressing and causing a skin tear. She is not sure how the right anterior leg wound started. It appears to be from trauma. She denies signs of infection. 10/19; patient presents for follow-up. She has been using Keystone antibiotic and collagen to the right  great toe wound. She is been using silver alginate to the right anterior and right plantar foot wound. She has been using Tubigrip to the right lower extremity. The plantar foot wound has healed. She has no issues or complaints today. 11/3; since the patient was last here she was seen in urgent care apparently for an area on the dorsal aspect of the right fifth toe perhaps over the PIP. I saw a picture of this on the daughter's cell phone. There was slough on this. Urgent care gave him doxycycline. She is also changed the dressing to all wounds back the [facility] and collagen which includes her right leg and left first toe 11/16; patient presents for follow-up. She has a new wound to the left knee. She states she fell. She has been using antibiotic ointment and collagen to this area. She has been using collagen and Keystone antibiotic to the right great toe wound. The anterior right leg wound is healed. She denies signs of infection. 11/30; patient presents for follow-up. The right great toe wound has healed. She has 1 remaining wound to the left knee. She has been using Hydrofera Blue and Medihoney here. 12/19; patient presents for follow-up. Her left knee wound has healed. She has no issues or complaints today. 09/30/2022 Patient's daughter called for an appointment due to increased swelling to her lower extremities bilaterally. Today patient presents with increased swelling to her lower extremities although there is no increased redness or warmth. She was evaluated by her nephrologist who increased her diuretics. Per patient and daughter her swelling has gone down to the  leg Over the past several days. She does not have any open wounds. She was advised to elevate her legs and not consume excess salt By her PCP and nephrologist. Patient has compression stockings that she has been using sporadically. Electronic Signature(s) Signed: 09/30/2022 4:21:39 PM By: Geralyn Corwin DO Entered By: Geralyn Corwin on 09/30/2022 15:10:44 -------------------------------------------------------------------------------- Physical Exam Details Patient Name: Date of Service: [name] 09/30/2022 2:15 PM Medical Record Number: [id number] Patient Account Number: [id number] Date of Birth/Sex: Treating RN: 17-Oct-1927 (89 y.o. F) Primary Care Provider: [name] Other Clinician: Referring Provider: Treating Provider/Extender: Noreene Larsson Weeks in Treatment: 35 Constitutional respirations regular, non-labored and within target range for patient.. Cardiovascular 2+ dorsalis pedis/posterior tibialis pulses. Psychiatric pleasant and cooperative. Notes 2+ pitting edema to the knees bilaterally. Venous stasis dermatitis. Electronic Signature(s) Signed: 09/30/2022 4:21:39 PM By: Geralyn Corwin DO Entered By: Geralyn Corwin on 09/30/2022 15:11:23 Bendall, [name] ([id number]) [id number] Page 3 of 8 -------------------------------------------------------------------------------- Physician Orders Details Patient Name: Date of Service: SIMAYA, LUMADUE 09/30/2022 2:15 PM Medical Record Number: [id number] Patient Account Number: [id number] Date of Birth/Sex: Treating RN: 12/02/1927 (89 y.o. [name] Primary Care Provider: [name] Other Clinician: Referring Provider: Treating Provider/Extender: Noreene Larsson Weeks in Treatment: 90 Verbal / Phone Orders: No Diagnosis Coding ICD-10 Coding Code Description I89.0 Lymphedema, not elsewhere classified I87.2 Venous insufficiency (chronic) (peripheral) G90.09 Other idiopathic peripheral autonomic neuropathy M86.9 Osteomyelitis, unspecified I48.91 Unspecified atrial fibrillation Z79.01 Long term (current) use of anticoagulants J44.9 Chronic obstructive pulmonary disease, unspecified I73.9 Peripheral vascular disease, unspecified Discharge From [facility] Discharge from  [facility] - Call if any future wound care needs. Edema Control - Lymphedema / SCD / Other Elevate legs to the level of the heart or above for 30 minutes daily and/or when sitting for 3-4 times a day throughout the day. Avoid standing for long periods of time. Exercise regularly Moisturize legs daily. - every night before bed. Other Edema Control Orders/Instructions: - tubigrip size E one layer apply in the morning and remove at night or patient may wear mild compression stockings. Non Wound Condition Other Non Wound Condition Orders/Instructions: - continue following with Kidney Doctor with lower leg fluid control. Also ask about arterial blood flow procedure. If kidney doctor in agreement then follow with Vein and Vascular for arterial blood flow. Electronic Signature(s) Signed: 09/30/2022 4:21:39 PM By: Geralyn Corwin DO Entered By: Geralyn Corwin on 09/30/2022 15:11:29 -------------------------------------------------------------------------------- Problem List Details Patient Name: Date of Service: [name] 09/30/2022 2:15 PM Medical Record Number: [id number] Patient Account Number: [id number] Date of Birth/Sex: Treating RN: 1928-01-15 (89 y.o. F) Primary Care Provider: [name] Other Clinician: Referring Provider: Treating Provider/Extender: Noreene Larsson Weeks in Treatment: 35 Active Problems ICD-10 Encounter Code Description Active Date MDM Diagnosis I89.0 Lymphedema, not elsewhere classified 01/28/2022 No Yes Reamy, Kashmir M ([id number]) [id number] Page 4 of 8 I87.2 Venous insufficiency (chronic) (peripheral) 01/28/2022 No Yes G90.09 Other idiopathic peripheral autonomic neuropathy 01/28/2022 No Yes M86.9 Osteomyelitis, unspecified 01/28/2022 No Yes I48.91 Unspecified atrial fibrillation 01/28/2022 No Yes Z79.01 Long term (current) use of anticoagulants 01/28/2022 No Yes J44.9 Chronic obstructive pulmonary  disease, unspecified 01/28/2022 No Yes I73.9 Peripheral vascular disease, unspecified 03/27/2022 No Yes Inactive Problems ICD-10 Code Description Active Date Inactive Date L97.812 Non-pressure chronic ulcer of other part of right lower leg with fat layer exposed 01/28/2022 01/28/2022 Resolved Problems ICD-10 Code Description Active Date Resolved Date L97.512 Non-pressure chronic ulcer  of other part of right foot with fat layer exposed 01/28/2022 01/28/2022 L79.892 Non-pressure chronic ulcer of other part of left lower leg with fat layer exposed 01/28/2022 01/28/2022 S81.801A Unspecified open wound, right lower leg, initial encounter 06/17/2022 06/17/2022 J19.417E Unspecified open wound, left lower leg, initial encounter 07/24/2022 07/24/2022 T79.8XXA Other early complications of trauma, initial encounter 07/24/2022 07/24/2022 Electronic Signature(s) Signed: 09/30/2022 4:21:39 PM By: Kalman Shan DO Entered By: Kalman Shan on 09/30/2022 15:08:00 -------------------------------------------------------------------------------- Progress Note Details Patient Name: Date of Service: Alonza Bogus. 09/30/2022 2:15 PM Medical Record Number: [id number] Patient Account Number: [id number] LEONARDO, PLAIA ([id number]) [id number] Page 5 of 8 Date of Birth/Sex: Treating RN: 10-09-1927 (89 y.o. F) Primary Care Provider: Other Clinician: Windy Carina Referring Provider: Treating Provider/Extender: Veneda Melter Weeks in Treatment: 47 Subjective Chief Complaint Information obtained from Patient 01/28/2022; right second toe amputation site dehiscence and bilateral lower extremity wounds. History of Present Illness (HPI) Admission 01/28/2022 Ms. Sally-Ann Cutbirth is a 89 year old female with a past medical history of idiopathic peripheral neuropathy status post amputation to the second right toe secondary to osteomyelitis, COPD and A-fib on Coumadin the presents  to the clinic for a 3-month history of nonhealing ulcer to a previous amputation site on the second right toe. She states she has tried Medihoney and silver alginate in the past to this area with little benefit. She also has 2 small areas limited to skin breakdown to her lower extremities bilaterally. She has chronic venous insufficiency but not has not been wearing her compression stockings. She states she bumped her legs against an object and not so the wound started. She has been using Medihoney to the sites. She denies signs of infection. 6/2; patient presents for follow-up. She had an x-ray of her right foot done at last clinic visit and this was negative for evidence of osteomyelitis. She also had a wound culture done that showed extra high levels of Staph aureus. I recommended Keystone antibiotics for this and this was ordered. She had ABIs with TBI's done as well that showed monophasic waveforms to the right foot with TBI of 0 and an ABI of 0.52. Urgent referral was made to vein and vascular and she saw Dr. Doren Custard on 6/1, yesterday and he recommended an arteriogram. This is scheduled for 6/16. Patient also reports a new wound to the right great toe. This is a blister that has ruptured. She also reports increased erythema to the toe. 6/6; the patient was worked in urgently today at the insistence of her daughter out of concern for a new wound on the lateral part of the plantar right great toe. She has her original postsurgical wound after the amputation of the right second toe, she has a wound on the medial part of the right great toe. The patient is apparently going for an angiogram by Dr. Doren Custard in 2 weeks time. 6/13; patient presents for follow-up. She has been using bacitracin to the abrasion on the right great toe. She has been using collagen and Keystone antibiotic to the amputation site. She has no issues or complaints today. She denies signs of infection. 6/22; patient presents for  follow-up. She states that her abdominal aortogram was canceled due to her renal function. She has been using Keystone antibiotics to the amputation site and Medihoney to the right great toe wound. At the pace of the right great toe she has a slitlike open area that she thinks was caused by the tape from the dressing.  6/29; patient presents for follow-up. She has been using Keystone antibiotics to the amputation site along with collagen. She has been using Medihoney to the right great toe wound. She has no other wounds. She denies signs of infection. 7/13; patient presents for follow-up. She has been using Keystone antibiotics and collagen to the wound sites. She currently denies signs of infection. She states she is scheduled to see me nephrology next month. 7/20; patient presents for follow-up. She continue Keystone antibiotics and collagen to the wound sites. She has no issues or complaints today. 8/1; patient presents for follow up. She continues to use keystone antibiotics and collagen to the wound sites. She has no issues or complaints today. 8/15; patient presents for follow-up. She has been using Keystone antibiotics and collagen to the wound sites. She followed up with her nephrologist who made medication changes. She is supposed to get a repeat BMP in 2 weeks. Her decrease in renal function was a limiting factor in obtaining an arteriogram for potential intervention for revascularization. She currently denies signs of infection. 9/2; patient presents for follow-up. She has been using Keystone antibiotic and collagen to the wound beds. She has no issues or complaints today. Reports there has been improvement in kidney function however not cleared to have her arteriogram just yet. 10/10; patient presents for follow-up. She has been using Keystone antibiotic and collagen to the wound beds. She reports 2 new wounds 1 to the anterior right lower extremity and another to the plantar aspect of the  right foot. She states that the right plantar foot wound was caused by the home health nurse changing the dressing and causing a skin tear. She is not sure how the right anterior leg wound started. It appears to be from trauma. She denies signs of infection. 10/19; patient presents for follow-up. She has been using Keystone antibiotic and collagen to the right great toe wound. She is been using silver alginate to the right anterior and right plantar foot wound. She has been using Tubigrip to the right lower extremity. The plantar foot wound has healed. She has no issues or complaints today. 11/3; since the patient was last here she was seen in urgent care apparently for an area on the dorsal aspect of the right fifth toe perhaps over the PIP. I saw a picture of this on the daughter's cell phone. There was slough on this. Urgent care gave him doxycycline. She is also changed the dressing to all wounds back the [facility] and collagen which includes her right leg and left first toe 11/16; patient presents for follow-up. She has a new wound to the left knee. She states she fell. She has been using antibiotic ointment and collagen to this area. She has been using collagen and Keystone antibiotic to the right great toe wound. The anterior right leg wound is healed. She denies signs of infection. 11/30; patient presents for follow-up. The right great toe wound has healed. She has 1 remaining wound to the left knee. She has been using Hydrofera Blue and Medihoney here. 12/19; patient presents for follow-up. Her left knee wound has healed. She has no issues or complaints today. 09/30/2022 Patient's daughter called for an appointment due to increased swelling to her lower extremities bilaterally. Today patient presents with increased swelling to her lower extremities although there is no increased redness or warmth. She was evaluated by her nephrologist who increased her diuretics. Per patient and daughter her  swelling has gone down to the leg Over  the past several days. She does not have any open wounds. She was advised to elevate her legs and not consume excess salt By her PCP and nephrologist. Patient has compression stockings that she has been using sporadically. Patient History Information obtained from Patient, Caregiver. Family History Cancer - Siblings, Heart Disease, Stroke - Mother, No family history of Diabetes. Social History SUJEILY, BROSCHART ([id number]) [id number] Page 6 of 8 Former smoker - quit 2003, Marital Status - Widowed, Alcohol Use - Never, Drug Use - No History, Caffeine Use - Never. Medical History Eyes Patient has history of Cataracts Hematologic/Lymphatic Patient has history of Anemia Respiratory Denies history of Chronic Obstructive Pulmonary Disease (COPD) Cardiovascular Patient has history of Congestive Heart Failure, Hypotension, Peripheral Venous Disease Musculoskeletal Patient has history of 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 Neurologic Patient has history of Neuropathy Medical A Surgical History Notes nd Hematologic/Lymphatic Thrombocytopenia Endocrine Hyperthyroidism, Hypothyroidism Objective Constitutional respirations regular, non-labored and within target range for patient.. Vitals Time Taken: 2:33 PM, Height: 62 in, Weight: 181 lbs, BMI: 33.1, Temperature: 97.6 F, Pulse: 75 bpm, Respiratory Rate: 18 breaths/min, Blood Pressure: 150/67 mmHg. Cardiovascular 2+ dorsalis pedis/posterior tibialis pulses. Psychiatric pleasant and cooperative. General Notes: 2+ pitting edema to the knees bilaterally. Venous stasis dermatitis. Assessment Active Problems ICD-10 Lymphedema, not elsewhere classified Venous insufficiency (chronic) (peripheral) Other idiopathic peripheral autonomic neuropathy Osteomyelitis, unspecified Unspecified atrial fibrillation Long term (current) use of anticoagulants Chronic  obstructive pulmonary disease, unspecified Peripheral vascular disease, unspecified Patient had increased swelling to the lower extremities bilaterally that started 2 weeks ago. Her diuretics were increased 1 week ago. She reports improvement in swelling however patient still has significant edema on exam. I recommended she follow-up with her nephrologist to discuss her diuretic regimen. Unfortunately patient has severe PAD to the right lower extremity with a right ABI of 0.52, TBI of 0 and monophasic waveforms throughout. For now I recommended Tubigrip to help with swelling. She may follow-up as needed. Plan Discharge From [facility] Services: Discharge from [place] - Call if any future wound care needs. Edema Control - Lymphedema / SCD / Other: Elevate legs to the level of the heart or above for 30 minutes daily and/or when sitting for 3-4 times a day throughout the day. Avoid standing for long periods of time. Exercise regularly Moisturize legs daily. - every night before bed. Other Edema Control Orders/Instructions: - tubigrip size E one layer apply in the morning and remove at night or patient may wear mild compression stockings. Non Wound Condition: REGIS, CAPURRO ([id number]) [id number] Page 7 of 8 Other Non Wound Condition Orders/Instructions: - continue following with Kidney Doctor with lower leg fluid control. Also ask about arterial blood flow procedure. If kidney doctor in agreement then follow with Vein and Vascular for arterial blood flow. 1. Daily compression stockings 2. Follow-up as needed Electronic Signature(s) Signed: 09/30/2022 4:21:39 PM By: Kalman Shan DO Entered By: Kalman Shan on 09/30/2022 15:13:22 -------------------------------------------------------------------------------- HxROS Details Patient Name: Date of Service: Alonza Bogus. 09/30/2022 2:15 PM Medical Record Number: [id number] Patient Account Number:  [id number] Date of Birth/Sex: Treating RN: 1928/08/19 (89 y.o. F) Primary Care Provider: Windy Carina Other Clinician: Referring Provider: Treating Provider/Extender: Veneda Melter Weeks in Treatment: 77 Information Obtained From Patient Caregiver Eyes Medical History: Positive for: Cataracts Hematologic/Lymphatic Medical History: Positive for: Anemia Past Medical History Notes: Thrombocytopenia Respiratory Medical History: Negative for: Chronic Obstructive Pulmonary Disease (COPD) Cardiovascular Medical History: Positive for: Congestive Heart Failure; Hypotension; Peripheral  Venous Disease Endocrine Medical History: Past Medical History Notes: Hyperthyroidism, Hypothyroidism Musculoskeletal Medical History: Positive for: 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 Neurologic Medical History: Positive for: Neuropathy HBO Extended History Items Eyes: Cataracts Immunizations Mccaffery, Diarra M ([id number]) [id number] Page 8 of 8 Pneumococcal Vaccine: Received Pneumococcal Vaccination: No Implantable Devices None Family and Social History Cancer: Yes - Siblings; Diabetes: No; Heart Disease: Yes; Stroke: Yes - Mother; Former smoker - quit 2003; Marital Status - Widowed; Alcohol Use: Never; Drug Use: No History; Caffeine Use: Never; Financial Concerns: No; Food, Clothing or Shelter Needs: No; Support System Lacking: No; Transportation Concerns: No Electronic Signature(s) Signed: 09/30/2022 4:21:39 PM By: Kalman Shan DO Entered By: Kalman Shan on 09/30/2022 15:10:55 -------------------------------------------------------------------------------- SuperBill Details Patient Name: Date of Service: Alonza Bogus. 09/30/2022 Medical Record Number: [id number] Patient Account Number: [id number] Date of Birth/Sex: Treating RN: Nov 01, 1927 (89 y.o. [name] Primary Care Provider: Windy Carina Other  Clinician: Referring Provider: Treating Provider/Extender: Veneda Melter Weeks in Treatment: 35 Diagnosis Coding ICD-10 Codes Code Description I89.0 Lymphedema, not elsewhere classified I87.2 Venous insufficiency (chronic) (peripheral) G90.09 Other idiopathic peripheral autonomic neuropathy M86.9 Osteomyelitis, unspecified I48.91 Unspecified atrial fibrillation Z79.01 Long term (current) use of anticoagulants J44.9 Chronic obstructive pulmonary disease, unspecified I73.9 Peripheral vascular disease, unspecified Facility Procedures : CPT4 Code: [id number] Description: 62376 - WOUND CARE VISIT-LEV 2 EST PT Modifier: Quantity: 1 Physician Procedures : CPT4 Code Description Modifier [id number] - WC PHYS LEVEL 3 - EST PT ICD-10 Diagnosis Description I89.0 Lymphedema, not elsewhere classified I87.2 Venous insufficiency (chronic) (peripheral) I73.9 Peripheral vascular disease, unspecified J44.9  Chronic obstructive pulmonary disease, unspecified Quantity: 1 Electronic Signature(s) Signed: 09/30/2022 4:21:39 PM By: Kalman Shan DO Entered By: Kalman Shan on 09/30/2022 15:13:41

## 2022-10-01 NOTE — Progress Notes (Signed)
[name] ([id number]) [id number] Page 1 of 6 Visit Report for 09/30/2022 Arrival Information Details Patient Name: Date of Service: [name], [name] 09/30/2022 2:15 PM Medical Record Number: [id number] Patient Account Number: [id number] Date of Birth/Sex: Treating RN: 11/11/1927 (89 y.o. F) Primary Care Terecia Plaut: Windy Carina Other Clinician: Referring Lue Sykora: Treating Lien Lyman/Extender: Veneda Melter Weeks in Treatment: 44 Visit Information History Since Last Visit Added or deleted any medications: Yes Patient Arrived: Wheel Chair Any new allergies or adverse reactions: No Arrival Time: 14:29 Had a fall or experienced change in No Accompanied By: daughter activities of daily living that may affect Transfer Assistance: None risk of falls: Patient Identification Verified: Yes Signs or symptoms of abuse/neglect since last visito No Secondary Verification Process Completed: Yes Hospitalized since last visit: No Patient Requires Transmission-Based Precautions: No Implantable device outside of the clinic excluding No Patient Has Alerts: Yes cellular tissue based products placed in the center Patient Alerts: Patient on Blood Thinner since last visit: Pain Present Now: No Electronic Signature(s) Signed: 09/30/2022 4:56:53 PM By: Erenest Blank Entered By: Erenest Blank on 09/30/2022 14:32:56 -------------------------------------------------------------------------------- Clinic Level of Care Assessment Details Patient Name: Date of Service: [name] 09/30/2022 2:15 PM Medical Record Number: [id number] Patient Account Number: [id number] Date of Birth/Sex: Treating RN: [birth year] (89 y.o. [name], [name] Primary Care Carmella Kees: Windy Carina Other Clinician: Referring Mick Tanguma: Treating Damone Fancher/Extender: Veneda Melter Weeks in Treatment: 35 Clinic Level of Care Assessment Items TOOL 4 Quantity Score X- 1  0 Use when only an EandM is performed on FOLLOW-UP visit ASSESSMENTS - Nursing Assessment / Reassessment X- 1 10 Reassessment of Co-morbidities (includes updates in patient status) X- 1 5 Reassessment of Adherence to Treatment Plan ASSESSMENTS - Wound and Skin A ssessment / Reassessment []  - 0 Simple Wound Assessment / Reassessment - one wound []  - 0 Complex Wound Assessment / Reassessment - multiple wounds X- 1 10 Dermatologic / Skin Assessment (not related to wound area) ASSESSMENTS - Focused Assessment []  - 0 Circumferential Edema Measurements - multi extremities []  - 0 Nutritional Assessment / Counseling / Intervention []  - 0 Lower Extremity Assessment (monofilament, tuning fork, pulses) [name] ([id number]) [id number] Page 2 of 6 []  - 0 Peripheral Arterial Disease Assessment (using hand held doppler) ASSESSMENTS - Ostomy and/or Continence Assessment and Care []  - 0 Incontinence Assessment and Management []  - 0 Ostomy Care Assessment and Management (repouching, etc.) PROCESS - Coordination of Care []  - 0 Simple Patient / Family Education for ongoing care X- 1 20 Complex (extensive) Patient / Family Education for ongoing care X- 1 10 Staff obtains Programmer, systems, Records, T Results / Process Orders est []  - 0 Staff telephones HHA, Nursing Homes / Clarify orders / etc []  - 0 Routine Transfer to another Facility (non-emergent condition) []  - 0 Routine Hospital Admission (non-emergent condition) []  - 0 New Admissions / Biomedical engineer / Ordering NPWT Apligraf, etc. , []  - 0 Emergency Hospital Admission (emergent condition) []  - 0 Simple Discharge Coordination X- 1 15 Complex (extensive) Discharge Coordination PROCESS - Special Needs []  - 0 Pediatric / Minor Patient Management []  - 0 Isolation Patient Management []  - 0 Hearing / Language / Visual special needs []  - 0 Assessment of Community assistance (transportation, D/C  planning, etc.) []  - 0 Additional assistance / Altered mentation []  - 0 Support Surface(s) Assessment (bed, cushion, seat, etc.) INTERVENTIONS - Wound Cleansing / Measurement []  - 0 Simple Wound Cleansing - one wound []  -  0 Complex Wound Cleansing - multiple wounds []  - 0 Wound Imaging (photographs - any number of wounds) []  - 0 Wound Tracing (instead of photographs) []  - 0 Simple Wound Measurement - one wound []  - 0 Complex Wound Measurement - multiple wounds INTERVENTIONS - Wound Dressings []  - 0 Small Wound Dressing one or multiple wounds []  - 0 Medium Wound Dressing one or multiple wounds []  - 0 Large Wound Dressing one or multiple wounds []  - 0 Application of Medications - topical []  - 0 Application of Medications - injection INTERVENTIONS - Miscellaneous []  - 0 External ear exam []  - 0 Specimen Collection (cultures, biopsies, blood, body fluids, etc.) []  - 0 Specimen(s) / Culture(s) sent or taken to Lab for analysis []  - 0 Patient Transfer (multiple staff / Civil Service fast streamer / Similar devices) []  - 0 Simple Staple / Suture removal (25 or less) []  - 0 Complex Staple / Suture removal (26 or more) []  - 0 Hypo / Hyperglycemic Management (close monitor of Blood Glucose) []  - 0 Ankle / Brachial Index (ABI) - do not check if billed separately [name], [name] ([id number]) [id number] Page 3 of 6 X- 1 5 Vital Signs Has the patient been seen at the hospital within the last three years: Yes Total Score: 75 Level Of Care: New/Established - Level 2 Electronic Signature(s) Signed: 09/30/2022 6:38:55 PM By: Deon Pilling RN, BSN Entered By: Deon Pilling on 09/30/2022 15:06:08 -------------------------------------------------------------------------------- Encounter Discharge Information Details Patient Name: Date of Service: Megan Bogus. 09/30/2022 2:15 PM Medical Record Number: [id number] Patient Account Number: [id number] Date of Birth/Sex:  Treating RN: Sep 18, 1927 (89 y.o. [name] Primary Care Melea Prezioso: Windy Carina Other Clinician: Referring Adeja Sarratt: Treating Lavette Yankovich/Extender: Veneda Melter Weeks in Treatment: 18 Encounter Discharge Information Items Discharge Condition: Stable Ambulatory Status: Wheelchair Discharge Destination: Home Transportation: Private Auto Accompanied By: daughter Schedule Follow-up Appointment: No Clinical Summary of Care: Notes tubigrip size E applied to right leg. Electronic Signature(s) Signed: 09/30/2022 6:38:55 PM By: Deon Pilling RN, BSN Entered By: Deon Pilling on 09/30/2022 15:06:50 -------------------------------------------------------------------------------- Lower Extremity Assessment Details Patient Name: Date of Service: Megan Bogus. 09/30/2022 2:15 PM Medical Record Number: [id number] Patient Account Number: [id number] Date of Birth/Sex: Treating RN: 1927-11-19 (89 y.o. F) Primary Care Pamelia Botto: Windy Carina Other Clinician: Referring Kambry Takacs: Treating Illeana Edick/Extender: Veneda Melter Weeks in Treatment: 35 Edema Assessment Assessed: [Left: No] [Right: No] Edema: [Left: Ye] [Right: s] Calf Left: Right: Point of Measurement: From Medial Instep 51 cm Ankle Left: Right: Point of Measurement: From Medial Instep 24.3 cm [name], [name] ([id number]) [id number] Page 4 of 6 Electronic Signature(s) Signed: 09/30/2022 4:56:53 PM By: Erenest Blank Entered By: Erenest Blank on 09/30/2022 14:43:50 -------------------------------------------------------------------------------- Multi Wound Chart Details Patient Name: Date of Service: Megan Bogus. 09/30/2022 2:15 PM Medical Record Number: [id number] Patient Account Number: [id number] Date of Birth/Sex: Treating RN: August 06, 1928 (89 y.o. F) Primary Care Tanae Petrosky: Windy Carina Other Clinician: Referring Harbor Paster: Treating Taniyah Ballow/Extender: Veneda Melter Weeks in Treatment: 35 Vital Signs Height(in): 62 Pulse(bpm): 75 Weight(lbs): 181 Blood Pressure(mmHg): 150/67 Body Mass Index(BMI): 33.1 Temperature(F): 97.6 Respiratory Rate(breaths/min): 18 [Treatment Notes:Wound Assessments Treatment Notes] Electronic Signature(s) Signed: 09/30/2022 4:21:39 PM By: Kalman Shan DO Entered By: Kalman Shan on 09/30/2022 15:08:04 -------------------------------------------------------------------------------- Multi-Disciplinary Care Plan Details Patient Name: Date of Service: Megan Bogus. 09/30/2022 2:15 PM Medical Record Number: [id number] Patient Account Number: [id number] Date of Birth/Sex: Treating RN: [birth year] (89 y.o. [name] Primary Care  Tashana Haberl: [name] Other Clinician: Referring Prerna Harold: Treating Nishanth Mccaughan/Extender: Noreene Larsson Weeks in Treatment: 35 Active Inactive Electronic Signature(s) Signed: 09/30/2022 6:38:55 PM By: Shawn Stall RN, BSN Entered By: Shawn Stall on 09/30/2022 14:57:03 -------------------------------------------------------------------------------- Pain Assessment Details Patient Name: Date of Service: [name]. 09/30/2022 2:15 PM Medical Record Number: [id number] Patient Account Number: [id number] [name], [name] ([id number]) [phone].pdf Page 5 of 6 Date of Birth/Sex: Treating RN: 05-15-1928 (89 y.o. F) Primary Care Julien Berryman: Other Clinician: [name] Referring Estephan Gallardo: Treating Jaeley Wiker/Extender: Noreene Larsson Weeks in Treatment: 35 Active Problems Location of Pain Severity and Description of Pain Patient Has Paino No Site Locations Pain Management and Medication Current Pain Management: Electronic Signature(s) Signed: 09/30/2022 4:56:53 PM By: Thayer Dallas Entered By: Thayer Dallas on 09/30/2022  14:41:47 -------------------------------------------------------------------------------- Patient/Caregiver Education Details Patient Name: Date of Service: [name] 1/23/2024andnbsp2:15 PM Medical Record Number: [id number] Patient Account Number: [id number] Date of Birth/Gender: Treating RN: [birth year] (89 y.o. [name] Primary Care Physician: [name] Other Clinician: Referring Physician: Treating Physician/Extender: Noreene Larsson Weeks in Treatment: 35 Education Assessment Education Provided To: Patient Education Topics Provided Venous: Handouts: Controlling Swelling with Compression Stockings, Managing Venous Insufficiency Methods: Explain/Verbal Responses: Reinforcements needed Electronic Signature(s) Signed: 09/30/2022 6:38:55 PM By: Shawn Stall RN, BSN Entered By: Shawn Stall on 09/30/2022 14:57:25 [name], [name] ([id number]) [id number] Page 6 of 6 -------------------------------------------------------------------------------- Vitals Details Patient Name: Date of Service: [name], [name] 09/30/2022 2:15 PM Medical Record Number: [id number] Patient Account Number: [id number] Date of Birth/Sex: Treating RN: 1928/03/31 (89 y.o. F) Primary Care Eveline Sauve: [name] Other Clinician: Referring Detric Scalisi: Treating Rishita Petron/Extender: Noreene Larsson Weeks in Treatment: 35 Vital Signs Time Taken: 14:33 Temperature (F): 97.6 Height (in): 62 Pulse (bpm): 75 Weight (lbs): 181 Respiratory Rate (breaths/min): 18 Body Mass Index (BMI): 33.1 Blood Pressure (mmHg): 150/67 Reference Range: 80 - 120 mg / dl Electronic Signature(s) Signed: 09/30/2022 4:56:53 PM By: Thayer Dallas Entered By: Thayer Dallas on 09/30/2022 14:41:40

## 2022-10-10 ENCOUNTER — Other Ambulatory Visit: Payer: Self-pay | Admitting: Family Medicine

## 2022-10-10 ENCOUNTER — Ambulatory Visit
Admission: RE | Admit: 2022-10-10 | Discharge: 2022-10-10 | Disposition: A | Discharge status: Home / Self Care | Payer: Medicare HMO | Source: Ambulatory Visit | Attending: Family Medicine | Admitting: Family Medicine

## 2022-10-10 ENCOUNTER — Encounter (INDEPENDENT_AMBULATORY_CARE_PROVIDER_SITE_OTHER): Payer: Medicare HMO | Admitting: Ophthalmology

## 2022-10-10 DIAGNOSIS — R7981 Abnormal blood-gas level: Secondary | ICD-10-CM

## 2022-10-13 ENCOUNTER — Encounter (HOSPITAL_BASED_OUTPATIENT_CLINIC_OR_DEPARTMENT_OTHER): Payer: Self-pay | Admitting: Cardiology

## 2022-10-16 NOTE — Progress Notes (Shared)
[facility] Note  10/17/2022     CHIEF COMPLAINT Patient presents for No chief complaint on file.   HISTORY OF PRESENT ILLNESS: [name] is a 89 y.o. female who presents to the clinic today for:    Patient states vision is stable, eyes are very dry, uses AT's BID OU, pt still follows with Dr. Frederico Hamman   Referring physician: [name], MD [address] [place],  [place] [address]  HISTORICAL INFORMATION:   Selected notes from the MEDICAL RECORD NUMBER Referred by Dr. Frederico Hamman for ret eval OU   CURRENT MEDICATIONS: Current Outpatient Medications (Ophthalmic Drugs)  Medication Sig   carboxymethylcellulose (REFRESH PLUS) 0.5 % SOLN Place 1 drop into both eyes 2 (two) times daily.   No current facility-administered medications for this visit. (Ophthalmic Drugs)   Current Outpatient Medications (Other)  Medication Sig   acetaminophen (TYLENOL) 325 MG tablet Take 650 mg by mouth every 6 (six) hours as needed for moderate pain.   Apoaequorin (PREVAGEN PO) Take 1 capsule by mouth at bedtime.   bumetanide (BUMEX) 1 MG tablet 0.5 tablets (0.5 mg total) daily. Per Dr. Johnney Ou   cholecalciferol (VITAMIN D3) 25 MCG (1000 UT) tablet Take 1,000 Units by mouth daily.   clotrimazole-betamethasone (LOTRISONE) cream Apply 1 application. topically 2 (two) times daily as needed (rash/irritation).   diclofenac Sodium (VOLTAREN) 1 % GEL Apply 2 g topically 4 (four) times daily as needed (pain).   gabapentin (NEURONTIN) 300 MG capsule Take 300 mg by mouth 2 (two) times daily.    levothyroxine (SYNTHROID) 100 MCG tablet Take 100 mcg by mouth every morning.   losartan (COZAAR) 100 MG tablet Take 0.5 tablets by mouth daily.   Menthol, Topical Analgesic, (BIOFREEZE) 10 % CREA Apply 1 application. topically daily as needed (pain).   metoprolol succinate (TOPROL-XL) 50 MG 24 hr tablet TAKE 1 TABLET BY MOUTH EVERY DAY WITH OR IMMEDIATELY FOLLOWING A MEAL  (Patient taking differently: Take 50 mg by mouth daily.)   Multiple Vitamins-Minerals (PRESERVISION/LUTEIN) CAPS Take 1 capsule by mouth 2 (two) times daily.    pantoprazole (PROTONIX) 40 MG tablet Take 40 mg by mouth daily.   senna-docusate (SENOKOT-S) 8.6-50 MG tablet Take 1 tablet by mouth 2 (two) times daily between meals as needed for moderate constipation or mild constipation.   spironolactone (ALDACTONE) 25 MG tablet TAKE 1 TABLET (25 MG TOTAL) BY MOUTH DAILY WITH BREAKFAST.   vitamin B-12 (CYANOCOBALAMIN) 1000 MCG tablet Take 1,000 mcg by mouth daily.   warfarin (COUMADIN) 1 MG tablet Take 1-2 mg by mouth See admin instructions. Take 1mg  by mouth on Tues, Thur, Saturday & Sunday, Then take  2 mg on Monday, Wednesday, Friday.   No current facility-administered medications for this visit. (Other)   REVIEW OF SYSTEMS:   ALLERGIES Allergies  Allergen Reactions   Amoxicillin Other (See Comments)    UNK reaction   Azithromycin     Other reaction(s): unsure   Codeine     Other reaction(s): unsure   Erythromycin Other (See Comments)    UNK reaction   Erythromycin Ethylsuccinate    Green Dyes Other (See Comments)    Allergic to ALL dyes   Iodine Other (See Comments)    Unk reaction   Misc. Sulfonamide Containing Compounds    Oxycodone Other (See Comments)    Unk reaction   Oxycodone-Acetaminophen Other (See Comments)    Unk reaction   Penicillins Other (See Comments)  Unk reaction   Sulfasalazine    Tramadol Hives   PAST MEDICAL HISTORY Past Medical History:  Diagnosis Date   Anticoagulant long-term use    Arrhythmia    Arthritis    Atrial fibrillation (HCC)    Congestive heart failure (CHF) (HCC)    Dysphagia    Esophageal reflux    Essential hypertension    Hiatal hernia    Pulmonary embolus ([place])    Thyroid disease    Past Surgical History:  Procedure Laterality Date   AMPUTATION TOE Right 11/17/2021   Procedure: RIGHT SECOND TOE AMPUTATION;  Surgeon:  Wylene Simmer, MD;  Location: [facility];  Service: Orthopedics;  Laterality: Right;   COLONOSCOPY     ELBOW SURGERY     LAPAROSCOPIC HYSTERECTOMY     FAMILY HISTORY Family History  Problem Relation Age of Onset   Stroke Mother    Cancer Brother    SOCIAL HISTORY Social History   Tobacco Use   Smoking status: Former    Packs/day: 1.00    Types: Cigarettes    Quit date: 10/02/2001    Years since quitting: 21.0   Smokeless tobacco: Never   Tobacco comments:    former smoker  Vaping Use   Vaping Use: Never used       OPHTHALMIC EXAM:  Not recorded     IMAGING AND PROCEDURES  Imaging and Procedures for 10/17/2022          ASSESSMENT/PLAN:  No diagnosis found.  1. Age related macular degeneration, non-exudative, with +GA, both eyes (OS>OD)  - focal peripapillary ORA OU  - OCT shows OD: perifoveal ORA/GA; OS: Diffuse ORA/GA greatest superior and nasal mac  - BCVA 20/60-2 OD; 20/40-2 OS  - The incidence, anatomy, and pathology of dry AMD, risk of progression, and the AREDS and AREDS 2 study including smoking risks discussed with patient.   - Cont amsler grid monitoring  - f/u 6 mos, DFE, OCT  2,3. Hypertensive retinopathy OU - discussed importance of tight BP control - monitor  4. Pseudophakia OU  - s/p CE/IOL OU  - IOLs in good position  - s/p YAG Cap OD (Dr. Frederico Hamman) - monitor  5. PCO OS  - 1-2+ -- likely visually significant  - discussed possible YAG cap OS  - pt wishes to f/u in 3-4 wks for possible YAG OS  6. Dry eyes OU  - recommend artificial tears and lubricating ointment as needed   Ophthalmic Meds Ordered this visit:  No orders of the defined types were placed in this encounter.    No follow-ups on file.  There are no Patient Instructions on file for this visit.   This document serves as a record of services personally performed by Gardiner Sleeper, MD, PhD. It was created on their behalf by Joetta Manners COT, an ophthalmic technician.  The creation of this record is the provider's dictation and/or activities during the visit.    Electronically signed by: Joetta Manners COT 02.08.2024 10:47 AM   Abbreviations: M myopia (nearsighted); A astigmatism; H hyperopia (farsighted); P presbyopia; Mrx spectacle prescription;  CTL contact lenses; OD right eye; OS left eye; OU both eyes  XT exotropia; ET esotropia; PEK punctate epithelial keratitis; PEE punctate epithelial erosions; DES dry eye syndrome; MGD meibomian gland dysfunction; ATs artificial tears; PFAT's preservative free artificial tears; [place] nuclear sclerotic cataract; PSC posterior subcapsular cataract; ERM epi-retinal membrane; PVD posterior vitreous detachment; RD retinal detachment; DM diabetes mellitus; DR diabetic retinopathy; NPDR non-proliferative diabetic  retinopathy; PDR proliferative diabetic retinopathy; CSME clinically significant macular edema; DME diabetic macular edema; dbh dot blot hemorrhages; CWS cotton wool spot; POAG primary open angle glaucoma; C/D cup-to-disc ratio; HVF humphrey visual field; GVF goldmann visual field; OCT optical coherence tomography; IOP intraocular pressure; BRVO Branch retinal vein occlusion; CRVO central retinal vein occlusion; CRAO central retinal artery occlusion; BRAO branch retinal artery occlusion; RT retinal tear; SB scleral buckle; PPV pars plana vitrectomy; VH Vitreous hemorrhage; PRP panretinal laser photocoagulation; IVK intravitreal kenalog; VMT vitreomacular traction; MH Macular hole;  NVD neovascularization of the disc; NVE neovascularization elsewhere; AREDS age related eye disease study; ARMD age related macular degeneration; POAG primary open angle glaucoma; EBMD epithelial/anterior basement membrane dystrophy; ACIOL anterior chamber intraocular lens; IOL intraocular lens; PCIOL posterior chamber intraocular lens; Phaco/IOL phacoemulsification with intraocular lens placement; [place] photorefractive keratectomy; LASIK laser  assisted in situ keratomileusis; HTN hypertension; DM diabetes mellitus; COPD chronic obstructive pulmonary disease

## 2022-10-17 ENCOUNTER — Encounter (INDEPENDENT_AMBULATORY_CARE_PROVIDER_SITE_OTHER): Payer: Medicare HMO | Admitting: Ophthalmology

## 2022-10-23 ENCOUNTER — Emergency Department (HOSPITAL_COMMUNITY): Payer: Medicare HMO

## 2022-10-23 ENCOUNTER — Other Ambulatory Visit: Payer: Self-pay

## 2022-10-23 ENCOUNTER — Encounter (HOSPITAL_COMMUNITY): Payer: Self-pay

## 2022-10-23 ENCOUNTER — Inpatient Hospital Stay (HOSPITAL_COMMUNITY)
Admission: EM | Admit: 2022-10-23 | Discharge: 2022-11-07 | DRG: 177 | Disposition: A | Discharge status: Skilled Nursing Facility | Payer: Medicare HMO | Attending: Student | Admitting: Student

## 2022-10-23 DIAGNOSIS — I082 Rheumatic disorders of both aortic and tricuspid valves: Secondary | ICD-10-CM | POA: Diagnosis present

## 2022-10-23 DIAGNOSIS — Z66 Do not resuscitate: Secondary | ICD-10-CM | POA: Diagnosis present

## 2022-10-23 DIAGNOSIS — Z882 Allergy status to sulfonamides status: Secondary | ICD-10-CM

## 2022-10-23 DIAGNOSIS — E1122 Type 2 diabetes mellitus with diabetic chronic kidney disease: Secondary | ICD-10-CM | POA: Diagnosis present

## 2022-10-23 DIAGNOSIS — I071 Rheumatic tricuspid insufficiency: Secondary | ICD-10-CM | POA: Insufficient documentation

## 2022-10-23 DIAGNOSIS — Z79890 Hormone replacement therapy (postmenopausal): Secondary | ICD-10-CM

## 2022-10-23 DIAGNOSIS — R1310 Dysphagia, unspecified: Secondary | ICD-10-CM | POA: Diagnosis present

## 2022-10-23 DIAGNOSIS — L89626 Pressure-induced deep tissue damage of left heel: Secondary | ICD-10-CM | POA: Diagnosis present

## 2022-10-23 DIAGNOSIS — L89896 Pressure-induced deep tissue damage of other site: Secondary | ICD-10-CM | POA: Diagnosis present

## 2022-10-23 DIAGNOSIS — J1282 Pneumonia due to coronavirus disease 2019: Secondary | ICD-10-CM | POA: Diagnosis present

## 2022-10-23 DIAGNOSIS — R0902 Hypoxemia: Secondary | ICD-10-CM | POA: Diagnosis present

## 2022-10-23 DIAGNOSIS — Z823 Family history of stroke: Secondary | ICD-10-CM

## 2022-10-23 DIAGNOSIS — Z89421 Acquired absence of other right toe(s): Secondary | ICD-10-CM

## 2022-10-23 DIAGNOSIS — R601 Generalized edema: Secondary | ICD-10-CM

## 2022-10-23 DIAGNOSIS — I959 Hypotension, unspecified: Secondary | ICD-10-CM | POA: Diagnosis not present

## 2022-10-23 DIAGNOSIS — L84 Corns and callosities: Secondary | ICD-10-CM | POA: Diagnosis present

## 2022-10-23 DIAGNOSIS — Z7901 Long term (current) use of anticoagulants: Secondary | ICD-10-CM

## 2022-10-23 DIAGNOSIS — L89156 Pressure-induced deep tissue damage of sacral region: Secondary | ICD-10-CM | POA: Diagnosis present

## 2022-10-23 DIAGNOSIS — M1990 Unspecified osteoarthritis, unspecified site: Secondary | ICD-10-CM | POA: Diagnosis present

## 2022-10-23 DIAGNOSIS — Z91041 Radiographic dye allergy status: Secondary | ICD-10-CM

## 2022-10-23 DIAGNOSIS — I5032 Chronic diastolic (congestive) heart failure: Secondary | ICD-10-CM | POA: Diagnosis present

## 2022-10-23 DIAGNOSIS — Z86711 Personal history of pulmonary embolism: Secondary | ICD-10-CM | POA: Diagnosis present

## 2022-10-23 DIAGNOSIS — J9601 Acute respiratory failure with hypoxia: Secondary | ICD-10-CM | POA: Diagnosis present

## 2022-10-23 DIAGNOSIS — I2724 Chronic thromboembolic pulmonary hypertension: Secondary | ICD-10-CM | POA: Diagnosis present

## 2022-10-23 DIAGNOSIS — Z881 Allergy status to other antibiotic agents status: Secondary | ICD-10-CM

## 2022-10-23 DIAGNOSIS — Z90710 Acquired absence of both cervix and uterus: Secondary | ICD-10-CM

## 2022-10-23 DIAGNOSIS — Z79899 Other long term (current) drug therapy: Secondary | ICD-10-CM

## 2022-10-23 DIAGNOSIS — E039 Hypothyroidism, unspecified: Secondary | ICD-10-CM | POA: Diagnosis present

## 2022-10-23 DIAGNOSIS — E871 Hypo-osmolality and hyponatremia: Secondary | ICD-10-CM | POA: Diagnosis present

## 2022-10-23 DIAGNOSIS — I5033 Acute on chronic diastolic (congestive) heart failure: Secondary | ICD-10-CM | POA: Diagnosis present

## 2022-10-23 DIAGNOSIS — U071 COVID-19: Principal | ICD-10-CM | POA: Diagnosis present

## 2022-10-23 DIAGNOSIS — K219 Gastro-esophageal reflux disease without esophagitis: Secondary | ICD-10-CM | POA: Diagnosis present

## 2022-10-23 DIAGNOSIS — Z6829 Body mass index (BMI) 29.0-29.9, adult: Secondary | ICD-10-CM

## 2022-10-23 DIAGNOSIS — K5900 Constipation, unspecified: Secondary | ICD-10-CM | POA: Diagnosis not present

## 2022-10-23 DIAGNOSIS — Z789 Other specified health status: Secondary | ICD-10-CM

## 2022-10-23 DIAGNOSIS — I4821 Permanent atrial fibrillation: Secondary | ICD-10-CM | POA: Diagnosis present

## 2022-10-23 DIAGNOSIS — I3139 Other pericardial effusion (noninflammatory): Secondary | ICD-10-CM | POA: Diagnosis present

## 2022-10-23 DIAGNOSIS — Z87891 Personal history of nicotine dependence: Secondary | ICD-10-CM

## 2022-10-23 DIAGNOSIS — R609 Edema, unspecified: Principal | ICD-10-CM

## 2022-10-23 DIAGNOSIS — Z885 Allergy status to narcotic agent status: Secondary | ICD-10-CM

## 2022-10-23 DIAGNOSIS — Z880 Allergy status to penicillin: Secondary | ICD-10-CM

## 2022-10-23 DIAGNOSIS — I350 Nonrheumatic aortic (valve) stenosis: Secondary | ICD-10-CM

## 2022-10-23 DIAGNOSIS — E669 Obesity, unspecified: Secondary | ICD-10-CM | POA: Diagnosis present

## 2022-10-23 DIAGNOSIS — N1832 Chronic kidney disease, stage 3b: Secondary | ICD-10-CM | POA: Diagnosis present

## 2022-10-23 DIAGNOSIS — L89616 Pressure-induced deep tissue damage of right heel: Secondary | ICD-10-CM | POA: Diagnosis present

## 2022-10-23 DIAGNOSIS — T380X5A Adverse effect of glucocorticoids and synthetic analogues, initial encounter: Secondary | ICD-10-CM | POA: Diagnosis not present

## 2022-10-23 DIAGNOSIS — I130 Hypertensive heart and chronic kidney disease with heart failure and stage 1 through stage 4 chronic kidney disease, or unspecified chronic kidney disease: Secondary | ICD-10-CM | POA: Diagnosis present

## 2022-10-23 LAB — RESP PANEL BY RT-PCR (RSV, FLU A&B, COVID)  RVPGX2
Influenza A by PCR: NEGATIVE
Influenza B by PCR: NEGATIVE
Resp Syncytial Virus by PCR: NEGATIVE
SARS Coronavirus 2 by RT PCR: POSITIVE — AB

## 2022-10-23 LAB — COMPREHENSIVE METABOLIC PANEL
ALT: 12 U/L (ref 0–44)
AST: 39 U/L (ref 15–41)
Albumin: 3.4 g/dL — ABNORMAL LOW (ref 3.5–5.0)
Alkaline Phosphatase: 141 U/L — ABNORMAL HIGH (ref 38–126)
Anion gap: 15 (ref 5–15)
BUN: 16 mg/dL (ref 8–23)
CO2: 28 mmol/L (ref 22–32)
Calcium: 9.2 mg/dL (ref 8.9–10.3)
Chloride: 89 mmol/L — ABNORMAL LOW (ref 98–111)
Creatinine, Ser: 1.57 mg/dL — ABNORMAL HIGH (ref 0.44–1.00)
GFR, Estimated: 31 mL/min — ABNORMAL LOW (ref >60)
Glucose, Bld: 88 mg/dL (ref 70–99)
Potassium: 3.7 mmol/L (ref 3.5–5.1)
Sodium: 132 mmol/L — ABNORMAL LOW (ref 135–145)
Total Bilirubin: 2 mg/dL — ABNORMAL HIGH (ref 0.3–1.2)
Total Protein: 7.3 g/dL (ref 6.5–8.1)

## 2022-10-23 LAB — CBC WITH DIFFERENTIAL/PLATELET
Abs Immature Granulocytes: 0.01 10*3/uL (ref 0.00–0.07)
Basophils Absolute: 0 10*3/uL (ref 0.0–0.1)
Basophils Relative: 1 %
Eosinophils Absolute: 0.1 10*3/uL (ref 0.0–0.5)
Eosinophils Relative: 1 %
HCT: 38.4 % (ref 36.0–46.0)
Hemoglobin: 12.1 g/dL (ref 12.0–15.0)
Immature Granulocytes: 0 %
Lymphocytes Relative: 12 %
Lymphs Abs: 0.5 10*3/uL — ABNORMAL LOW (ref 0.7–4.0)
MCH: 27.6 pg (ref 26.0–34.0)
MCHC: 31.5 g/dL (ref 30.0–36.0)
MCV: 87.5 fL (ref 80.0–100.0)
Monocytes Absolute: 0.6 10*3/uL (ref 0.1–1.0)
Monocytes Relative: 13 %
Neutro Abs: 3.1 10*3/uL (ref 1.7–7.7)
Neutrophils Relative %: 73 %
Platelets: 117 10*3/uL — ABNORMAL LOW (ref 150–400)
RBC: 4.39 MIL/uL (ref 3.87–5.11)
RDW: 15.8 % — ABNORMAL HIGH (ref 11.5–15.5)
WBC: 4.2 10*3/uL (ref 4.0–10.5)
nRBC: 0 % (ref 0.0–0.2)

## 2022-10-23 LAB — BRAIN NATRIURETIC PEPTIDE: B Natriuretic Peptide: 373.1 pg/mL — ABNORMAL HIGH (ref 0.0–100.0)

## 2022-10-23 LAB — PROTIME-INR
INR: 2.4 — ABNORMAL HIGH (ref 0.8–1.2)
Prothrombin Time: 26.2 seconds — ABNORMAL HIGH (ref 11.4–15.2)

## 2022-10-23 LAB — TROPONIN I (HIGH SENSITIVITY): Troponin I (High Sensitivity): 29 ng/L — ABNORMAL HIGH (ref <18)

## 2022-10-23 MED ORDER — SODIUM CHLORIDE 0.9% FLUSH: 3.0000 mL | INTRAVENOUS | Status: DC | PRN

## 2022-10-23 MED ORDER — SODIUM CHLORIDE 0.9% FLUSH
3.0000 mL | Freq: Two times a day (BID) | INTRAVENOUS | Status: DC
  Administered 2022-10-24 – 2022-11-07 (×28): 3 mL via INTRAVENOUS

## 2022-10-23 MED ORDER — ACETAMINOPHEN 325 MG PO TABS
650.0000 mg | ORAL_TABLET | ORAL | Status: DC | PRN
  Administered 2022-10-24 – 2022-11-05 (×17): 650 mg via ORAL
  Filled 2022-10-23 (×17): qty 2

## 2022-10-23 MED ORDER — ONDANSETRON HCL 4 MG/2ML IJ SOLN: 4.0000 mg | Freq: Four times a day (QID) | INTRAMUSCULAR | Status: DC | PRN

## 2022-10-23 MED ORDER — BUMETANIDE 0.25 MG/ML IJ SOLN
0.5000 mg | Freq: Once | INTRAMUSCULAR | Status: AC
  Administered 2022-10-23: 0.5 mg via INTRAVENOUS
  Filled 2022-10-23: qty 2

## 2022-10-23 MED ORDER — SODIUM CHLORIDE 0.9 % IV SOLN
100.0000 mg | Freq: Every day | INTRAVENOUS | Status: AC
  Administered 2022-10-25 – 2022-10-26 (×2): 100 mg via INTRAVENOUS
  Filled 2022-10-23 (×2): qty 20

## 2022-10-23 MED ORDER — SODIUM CHLORIDE 0.9 % IV SOLN
200.0000 mg | Freq: Once | INTRAVENOUS | Status: AC
  Administered 2022-10-24: 200 mg via INTRAVENOUS
  Filled 2022-10-23: qty 40

## 2022-10-23 MED ORDER — SODIUM CHLORIDE 0.9 % IV SOLN: 250.0000 mL | INTRAVENOUS | Status: DC | PRN

## 2022-10-23 MED ORDER — DEXAMETHASONE 6 MG PO TABS
6.0000 mg | ORAL_TABLET | ORAL | Status: AC
  Administered 2022-10-23 – 2022-11-01 (×10): 6 mg via ORAL
  Filled 2022-10-23 (×8): qty 1
  Filled 2022-10-23: qty 2
  Filled 2022-10-23 (×2): qty 1

## 2022-10-23 NOTE — Assessment & Plan Note (Addendum)
Looks like she takes coumadin chronically for this and for permanent a.fib - continue coumadin per pharmacy

## 2022-10-23 NOTE — ED Notes (Signed)
Patient currently on 3L [place] satting 95%.

## 2022-10-23 NOTE — ED Notes (Signed)
Patient satting 98% now on 2L [place].

## 2022-10-23 NOTE — ED Provider Notes (Signed)
[facility] Provider Note   CSN: [id number] Arrival date & time: 10/23/22  2126     History  Chief Complaint  Patient presents with   Edema    BIB EMS from home for bilateral edema in lower legs, seeing wound MD for wounds on legs, hx of fluid on her lungs, no hx of COPD, hx of controlled afib, no complaints of SOB at current, VSS, reports daughter currently has covid   Shortness of Breath    [name] is a 89 y.o. female.  EMS called to the office for shortness of breath, leg swelling.  Family member sick with COVID in the house.  Patient has significant swelling to her legs per EMS.  She does not really complain of a lot of symptoms but she does have increased work of breathing.  She has a history of atrial fibrillation on Coumadin.  History of edema and is on Bumex.  She denies any active chest pain or shortness of breath or cough or sputum production.  Denies any abdominal pain.  The history is provided by the patient and the EMS personnel.       Home Medications Prior to Admission medications   Medication Sig Start Date End Date Taking? Authorizing Provider  acetaminophen (TYLENOL) 325 MG tablet Take 650 mg by mouth every 6 (six) hours as needed for moderate pain.    [provider]  Apoaequorin (PREVAGEN PO) Take 1 capsule by mouth at bedtime.    [provider]  bumetanide (BUMEX) 1 MG tablet 0.5 tablets (0.5 mg total) daily. Per Dr. Johnney Ou 12/17/21   [provider]  carboxymethylcellulose (REFRESH PLUS) 0.5 % SOLN Place 1 drop into both eyes 2 (two) times daily.    [provider]  cholecalciferol (VITAMIN D3) 25 MCG (1000 UT) tablet Take 1,000 Units by mouth daily.    [provider]  clotrimazole-betamethasone (LOTRISONE) cream Apply 1 application. topically 2 (two) times daily as needed (rash/irritation). 11/09/21   [provider]  diclofenac Sodium (VOLTAREN) 1 % GEL  Apply 2 g topically 4 (four) times daily as needed (pain).    [provider]  gabapentin (NEURONTIN) 300 MG capsule Take 300 mg by mouth 2 (two) times daily.     [provider]  levothyroxine (SYNTHROID) 100 MCG tablet Take 100 mcg by mouth every morning. 09/05/21   [provider]  losartan (COZAAR) 100 MG tablet Take 0.5 tablets by mouth daily.    [provider]  Menthol, Topical Analgesic, (BIOFREEZE) 10 % CREA Apply 1 application. topically daily as needed (pain).    [provider]  metoprolol succinate (TOPROL-XL) 50 MG 24 hr tablet TAKE 1 TABLET BY MOUTH EVERY DAY WITH OR IMMEDIATELY FOLLOWING A MEAL Patient taking differently: Take 50 mg by mouth daily. 02/04/22   Buford Dresser, MD  Multiple Vitamins-Minerals (PRESERVISION/LUTEIN) CAPS Take 1 capsule by mouth 2 (two) times daily.     [provider]  pantoprazole (PROTONIX) 40 MG tablet Take 40 mg by mouth daily.    [provider]  senna-docusate (SENOKOT-S) 8.6-50 MG tablet Take 1 tablet by mouth 2 (two) times daily between meals as needed for moderate constipation or mild constipation. 11/20/21   Mercy Riding, MD  spironolactone (ALDACTONE) 25 MG tablet TAKE 1 TABLET (25 MG TOTAL) BY MOUTH DAILY WITH BREAKFAST. 07/18/22   Buford Dresser, MD  vitamin B-12 (CYANOCOBALAMIN) 1000 MCG tablet Take 1,000 mcg by mouth  daily.    [provider]  warfarin (COUMADIN) 1 MG tablet Take 1-2 mg by mouth See admin instructions. Take 14m by mouth on Tues, Thur, Saturday & Sunday, Then take  2 mg on Monday, Wednesday, Friday.    [provider]      Allergies    Amoxicillin, Azithromycin, Codeine, Erythromycin, Erythromycin ethylsuccinate, Green dyes, Iodine, Misc. sulfonamide containing compounds, Oxycodone, Oxycodone-acetaminophen, Penicillins, Sulfasalazine, and Tramadol    Review of Systems   Review of Systems  Physical Exam Updated Vital Signs BP  (!) 143/76   Pulse 87   Temp 98.1 F (36.7 C) (Oral)   Resp (!) 23   Ht 5' 5"$  (1.651 m)   Wt 82.6 kg   SpO2 100%   BMI 30.29 kg/m  Physical Exam Vitals and nursing note reviewed.  Constitutional:      General: She is not in acute distress.    Appearance: She is well-developed.  HENT:     Head: Normocephalic and atraumatic.  Eyes:     Conjunctiva/sclera: Conjunctivae normal.  Cardiovascular:     Rate and Rhythm: Normal rate.     Pulses: Normal pulses.     Heart sounds: Normal heart sounds. No murmur heard. Pulmonary:     Effort: Pulmonary effort is normal. No respiratory distress.     Breath sounds: Rhonchi present. No decreased breath sounds.  Abdominal:     Palpations: Abdomen is soft.     Tenderness: There is no abdominal tenderness.  Musculoskeletal:        General: No swelling.     Cervical back: Neck supple.     Right lower leg: Edema present.     Left lower leg: Edema present.     Comments: 3+ pitting edema bilaterally with chronic venous stasis changes over both legs  Skin:    General: Skin is warm and dry.     Capillary Refill: Capillary refill takes less than 2 seconds.  Neurological:     Mental Status: She is alert.  Psychiatric:        Mood and Affect: Mood normal.     ED Results / Procedures / Treatments   Labs (all labs ordered are listed, but only abnormal results are displayed) Labs Reviewed  RESP PANEL BY RT-PCR (RSV, FLU A&B, COVID)  [id number] - Abnormal; Notable for the following components:      Result Value   SARS Coronavirus 2 by RT PCR POSITIVE (*)    All other components within normal limits  CBC WITH DIFFERENTIAL/PLATELET - Abnormal; Notable for the following components:   RDW 15.8 (*)    Platelets 117 (*)    Lymphs Abs 0.5 (*)    All other components within normal limits  COMPREHENSIVE METABOLIC PANEL - Abnormal; Notable for the following components:   Sodium 132 (*)    Chloride 89 (*)    Creatinine, Ser 1.57 (*)    Albumin 3.4 (*)     Alkaline Phosphatase 141 (*)    Total Bilirubin 2.0 (*)    GFR, Estimated 31 (*)    All other components within normal limits  BRAIN NATRIURETIC PEPTIDE - Abnormal; Notable for the following components:   B Natriuretic Peptide 373.1 (*)    All other components within normal limits  PROTIME-INR - Abnormal; Notable for the following components:   Prothrombin Time 26.2 (*)    INR 2.4 (*)    All other components within normal limits  TROPONIN I (HIGH SENSITIVITY) - Abnormal; Notable for  the following components:   Troponin I (High Sensitivity) 29 (*)    All other components within normal limits    EKG EKG Interpretation  Date/Time:  Thursday October 23 2022 21:45:05 EST Ventricular Rate:  93 PR Interval:  167 QRS Duration: 100 QT Interval:  367 QTC Calculation: 457 R Axis:   168 Text Interpretation: Sinus rhythm Atrial premature complexes in couplets Low voltage, precordial leads Probable right ventricular hypertrophy Lateral infarct, age indeterminate Confirmed by Lennice Sites [phone]) on 10/23/2022 9:57:46 PM  Radiology DG Chest Portable 1 View  Result Date: 10/23/2022 CLINICAL DATA:  Shortness of breath EXAM: PORTABLE CHEST 1 VIEW COMPARISON:  10/10/2022 FINDINGS: Diffuse cardiac enlargement. Pulmonary vascular congestion. Perihilar and basilar interstitial changes likely edema. Small bilateral pleural effusions. There is mild progression since prior study. No pneumothorax. Apical pleural calcifications, likely postinflammatory. Calcification of the aorta. Degenerative changes in the shoulders. IMPRESSION: Cardiac enlargement with pulmonary vascular congestion, interstitial edema, and small bilateral pleural effusions, progressing since prior study. Electronically Signed   By: Lucienne Capers M.D.   On: 10/23/2022 21:51    Procedures Procedures    Medications Ordered in ED Medications  bumetanide (BUMEX) injection 0.5 mg (0.5 mg Intravenous Given 10/23/22 2214)    ED  Course/ Medical Decision Making/ A&P                             Medical Decision Making Amount and/or Complexity of Data Reviewed Labs: ordered. Radiology: ordered.  Risk Prescription drug management. Decision regarding hospitalization.   [name] is here with shortness of breath, edema.  COVID exposure.  She has hypoxia with EMS and upon arrival here she is 82 on room air.  Placed on 2 L of oxygen.  She looks grossly volume overloaded on exam.  She does not have any major symptoms but she does here with increased work of breathing, rhonchi, significant pitting edema in her legs.  She has a history of A-fib on Coumadin, PE, congestive heart failure.  She had COVID exposure at home as well.  She denies any fevers or chills.  Differential includes COVID hypoxia versus volume overload hypoxia seems less likely to be ACS.  EKG per my review and interpretation shows sinus tachycardia, no obvious ischemic changes.  Will get CBC, BMP, troponin, BNP, COVID test, chest x-ray.  Give a dose IV Bumex.  Per my review and interpretation of chest x-ray consistent with volume overload may be infectious process.  Radiology reads this mostly as congestion and interstitial edema.  INR is at goal at 2.4.  Per my further review and interpretation of labs her troponin is 29, BNP is 375, COVID test is positive.  There is no significant leukocytosis.  Overall we will admit patient for hypoxia in the setting of COVID and anasarca.  Will admit to medicine.  She is stable on 2 L of oxygen.  This chart was dictated using voice recognition software.  Despite best efforts to proofread,  errors can occur which can change the documentation meaning.         Final Clinical Impression(s) / ED Diagnoses Final diagnoses:  Peripheral edema  Anasarca  Acute respiratory failure with hypoxia [facility])  COVID-19    Rx / DC Orders ED Discharge Orders     None         Lennice Sites, DO 10/23/22 2302

## 2022-10-23 NOTE — Assessment & Plan Note (Addendum)
Cont coumadin BB stopped recently due to hypotension.

## 2022-10-23 NOTE — Assessment & Plan Note (Signed)
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

## 2022-10-24 DIAGNOSIS — E669 Obesity, unspecified: Secondary | ICD-10-CM | POA: Diagnosis present

## 2022-10-24 DIAGNOSIS — I350 Nonrheumatic aortic (valve) stenosis: Secondary | ICD-10-CM | POA: Diagnosis not present

## 2022-10-24 DIAGNOSIS — R601 Generalized edema: Secondary | ICD-10-CM | POA: Diagnosis present

## 2022-10-24 DIAGNOSIS — Z86711 Personal history of pulmonary embolism: Secondary | ICD-10-CM

## 2022-10-24 DIAGNOSIS — Z66 Do not resuscitate: Secondary | ICD-10-CM | POA: Diagnosis present

## 2022-10-24 DIAGNOSIS — I130 Hypertensive heart and chronic kidney disease with heart failure and stage 1 through stage 4 chronic kidney disease, or unspecified chronic kidney disease: Secondary | ICD-10-CM | POA: Diagnosis present

## 2022-10-24 DIAGNOSIS — M79609 Pain in unspecified limb: Secondary | ICD-10-CM | POA: Diagnosis not present

## 2022-10-24 DIAGNOSIS — J1282 Pneumonia due to coronavirus disease 2019: Secondary | ICD-10-CM | POA: Diagnosis present

## 2022-10-24 DIAGNOSIS — Z87891 Personal history of nicotine dependence: Secondary | ICD-10-CM | POA: Diagnosis not present

## 2022-10-24 DIAGNOSIS — I272 Pulmonary hypertension, unspecified: Secondary | ICD-10-CM | POA: Diagnosis not present

## 2022-10-24 DIAGNOSIS — E1122 Type 2 diabetes mellitus with diabetic chronic kidney disease: Secondary | ICD-10-CM | POA: Diagnosis present

## 2022-10-24 DIAGNOSIS — Z6829 Body mass index (BMI) 29.0-29.9, adult: Secondary | ICD-10-CM | POA: Diagnosis not present

## 2022-10-24 DIAGNOSIS — I2724 Chronic thromboembolic pulmonary hypertension: Secondary | ICD-10-CM

## 2022-10-24 DIAGNOSIS — R609 Edema, unspecified: Secondary | ICD-10-CM | POA: Diagnosis not present

## 2022-10-24 DIAGNOSIS — J9601 Acute respiratory failure with hypoxia: Secondary | ICD-10-CM | POA: Diagnosis present

## 2022-10-24 DIAGNOSIS — R0902 Hypoxemia: Secondary | ICD-10-CM | POA: Diagnosis not present

## 2022-10-24 DIAGNOSIS — I4821 Permanent atrial fibrillation: Secondary | ICD-10-CM

## 2022-10-24 DIAGNOSIS — R0602 Shortness of breath: Secondary | ICD-10-CM | POA: Diagnosis not present

## 2022-10-24 DIAGNOSIS — E039 Hypothyroidism, unspecified: Secondary | ICD-10-CM | POA: Diagnosis present

## 2022-10-24 DIAGNOSIS — L89156 Pressure-induced deep tissue damage of sacral region: Secondary | ICD-10-CM | POA: Diagnosis present

## 2022-10-24 DIAGNOSIS — I959 Hypotension, unspecified: Secondary | ICD-10-CM | POA: Diagnosis not present

## 2022-10-24 DIAGNOSIS — I5033 Acute on chronic diastolic (congestive) heart failure: Secondary | ICD-10-CM

## 2022-10-24 DIAGNOSIS — E871 Hypo-osmolality and hyponatremia: Secondary | ICD-10-CM | POA: Diagnosis present

## 2022-10-24 DIAGNOSIS — I082 Rheumatic disorders of both aortic and tricuspid valves: Secondary | ICD-10-CM | POA: Diagnosis present

## 2022-10-24 DIAGNOSIS — N1832 Chronic kidney disease, stage 3b: Secondary | ICD-10-CM | POA: Diagnosis present

## 2022-10-24 DIAGNOSIS — T380X5A Adverse effect of glucocorticoids and synthetic analogues, initial encounter: Secondary | ICD-10-CM | POA: Diagnosis not present

## 2022-10-24 DIAGNOSIS — L89896 Pressure-induced deep tissue damage of other site: Secondary | ICD-10-CM | POA: Diagnosis present

## 2022-10-24 DIAGNOSIS — I3139 Other pericardial effusion (noninflammatory): Secondary | ICD-10-CM | POA: Diagnosis present

## 2022-10-24 DIAGNOSIS — I5023 Acute on chronic systolic (congestive) heart failure: Secondary | ICD-10-CM | POA: Diagnosis not present

## 2022-10-24 DIAGNOSIS — Z7901 Long term (current) use of anticoagulants: Secondary | ICD-10-CM | POA: Diagnosis not present

## 2022-10-24 DIAGNOSIS — Z79890 Hormone replacement therapy (postmenopausal): Secondary | ICD-10-CM | POA: Diagnosis not present

## 2022-10-24 DIAGNOSIS — U071 COVID-19: Secondary | ICD-10-CM | POA: Diagnosis not present

## 2022-10-24 DIAGNOSIS — Z79899 Other long term (current) drug therapy: Secondary | ICD-10-CM | POA: Diagnosis not present

## 2022-10-24 LAB — BASIC METABOLIC PANEL
Anion gap: 16 — ABNORMAL HIGH (ref 5–15)
BUN: 16 mg/dL (ref 8–23)
CO2: 27 mmol/L (ref 22–32)
Calcium: 8.8 mg/dL — ABNORMAL LOW (ref 8.9–10.3)
Chloride: 92 mmol/L — ABNORMAL LOW (ref 98–111)
Creatinine, Ser: 1.47 mg/dL — ABNORMAL HIGH (ref 0.44–1.00)
GFR, Estimated: 33 mL/min — ABNORMAL LOW (ref >60)
Glucose, Bld: 86 mg/dL (ref 70–99)
Potassium: 3.3 mmol/L — ABNORMAL LOW (ref 3.5–5.1)
Sodium: 135 mmol/L (ref 135–145)

## 2022-10-24 LAB — TROPONIN I (HIGH SENSITIVITY): Troponin I (High Sensitivity): 29 ng/L — ABNORMAL HIGH (ref <18)

## 2022-10-24 MED ORDER — POTASSIUM CHLORIDE 10 MEQ/100ML IV SOLN
10.0000 meq | Freq: Once | INTRAVENOUS | Status: DC
  Administered 2022-10-24: 10 meq via INTRAVENOUS
  Filled 2022-10-24: qty 100

## 2022-10-24 MED ORDER — BUMETANIDE 2 MG PO TABS
2.0000 mg | ORAL_TABLET | Freq: Two times a day (BID) | ORAL | Status: DC
  Administered 2022-10-24 – 2022-11-01 (×16): 2 mg via ORAL
  Filled 2022-10-24 (×18): qty 1

## 2022-10-24 MED ORDER — POTASSIUM CHLORIDE CRYS ER 20 MEQ PO TBCR
40.0000 meq | EXTENDED_RELEASE_TABLET | Freq: Once | ORAL | Status: AC
  Administered 2022-10-24: 40 meq via ORAL
  Filled 2022-10-24: qty 2

## 2022-10-24 MED ORDER — GABAPENTIN 300 MG PO CAPS: 300.0000 mg | ORAL_CAPSULE | Freq: Two times a day (BID) | ORAL | Status: DC

## 2022-10-24 MED ORDER — WARFARIN - PHARMACIST DOSING INPATIENT: Freq: Every day | Status: DC

## 2022-10-24 MED ORDER — POTASSIUM CHLORIDE 10 MEQ/100ML IV SOLN
10.0000 meq | INTRAVENOUS | Status: DC
  Administered 2022-10-24: 10 meq via INTRAVENOUS
  Filled 2022-10-24: qty 100

## 2022-10-24 MED ORDER — GABAPENTIN 300 MG PO CAPS
300.0000 mg | ORAL_CAPSULE | Freq: Two times a day (BID) | ORAL | Status: DC
  Administered 2022-10-24 – 2022-11-07 (×30): 300 mg via ORAL
  Filled 2022-10-24 (×30): qty 1

## 2022-10-24 MED ORDER — BUMETANIDE 1 MG PO TABS
1.0000 mg | ORAL_TABLET | Freq: Two times a day (BID) | ORAL | Status: DC
  Administered 2022-10-24 (×2): 1 mg via ORAL
  Filled 2022-10-24 (×3): qty 1

## 2022-10-24 MED ORDER — APOAEQUORIN 10 MG PO CAPS: ORAL_CAPSULE | Freq: Every day | ORAL | Status: DC

## 2022-10-24 MED ORDER — POLYVINYL ALCOHOL 1.4 % OP SOLN
1.0000 [drp] | Freq: Two times a day (BID) | OPHTHALMIC | Status: DC
  Administered 2022-10-24 – 2022-11-06 (×21): 1 [drp] via OPHTHALMIC
  Filled 2022-10-24 (×3): qty 15

## 2022-10-24 MED ORDER — LEVOTHYROXINE SODIUM 100 MCG PO TABS
100.0000 ug | ORAL_TABLET | Freq: Every day | ORAL | Status: DC
  Administered 2022-10-24 – 2022-11-07 (×14): 100 ug via ORAL
  Filled 2022-10-24 (×14): qty 1

## 2022-10-24 MED ORDER — MUPIROCIN CALCIUM 2 % EX CREA
TOPICAL_CREAM | Freq: Every day | CUTANEOUS | Status: DC
  Filled 2022-10-24: qty 15

## 2022-10-24 MED ORDER — WARFARIN SODIUM 2 MG PO TABS
2.0000 mg | ORAL_TABLET | ORAL | Status: DC
  Administered 2022-10-24: 2 mg via ORAL
  Filled 2022-10-24: qty 1

## 2022-10-24 MED ORDER — VITAMIN D 25 MCG (1000 UNIT) PO TABS
1000.0000 [IU] | ORAL_TABLET | Freq: Every day | ORAL | Status: DC
  Administered 2022-10-24 – 2022-11-07 (×15): 1000 [IU] via ORAL
  Filled 2022-10-24 (×15): qty 1

## 2022-10-24 MED ORDER — MAGNESIUM SULFATE 2 GM/50ML IV SOLN
2.0000 g | Freq: Once | INTRAVENOUS | Status: AC
  Administered 2022-10-24: 2 g via INTRAVENOUS
  Filled 2022-10-24: qty 50

## 2022-10-24 MED ORDER — POTASSIUM CHLORIDE 20 MEQ PO PACK
40.0000 meq | PACK | Freq: Once | ORAL | Status: AC
  Administered 2022-10-24: 40 meq via ORAL
  Filled 2022-10-24: qty 2

## 2022-10-24 MED ORDER — VITAMIN B-12 1000 MCG PO TABS
1000.0000 ug | ORAL_TABLET | Freq: Every day | ORAL | Status: DC
  Administered 2022-10-24 – 2022-11-07 (×15): 1000 ug via ORAL
  Filled 2022-10-24 (×15): qty 1

## 2022-10-24 MED ORDER — WARFARIN SODIUM 1 MG PO TABS
1.0000 mg | ORAL_TABLET | ORAL | Status: DC
  Administered 2022-10-24: 1 mg via ORAL
  Filled 2022-10-24: qty 1

## 2022-10-24 MED ORDER — PANTOPRAZOLE SODIUM 40 MG PO TBEC
40.0000 mg | DELAYED_RELEASE_TABLET | Freq: Every day | ORAL | Status: DC
  Administered 2022-10-24 – 2022-11-07 (×15): 40 mg via ORAL
  Filled 2022-10-24 (×15): qty 1

## 2022-10-24 NOTE — Progress Notes (Signed)
The patient is a 89 yr old woman who was brought to the ED by her daughter, [name], out of concern for her mothers increased swelling, increasing SOB/DOE. The daughter herself had been diagnosed with COVID-19, and had concern for her mother being positive.   In the ED she was found to be hypoxic. She was positive for COVID. Chest x-ray demosntrated pulmonary vascular congestion with interstitial edema and bilateral pleural effusions that was found to be worse than prior exam. She has 4+ pitting edema of her lower extremities with weeping. She has ulcerations on her toes bilaterally.  She has been admitted to a telemetry bed earlier today by my colleague, Dr. Alcario Drought. She will receive decadron, Remdesevir and bumex. Wound care has been consulted to attend to her toes. Magnesium and potassium have been supplemented. She will be on airborne precautions. She has been continued on her home medications for atrial fibrillation and hypothyroidism.  I have seen and examined this patient myself.

## 2022-10-24 NOTE — Plan of Care (Signed)
  Problem: Education: Goal: Knowledge of General Education information will improve Description Including pain rating scale, medication(s)/side effects and non-pharmacologic comfort measures Outcome: Progressing   

## 2022-10-24 NOTE — ED Notes (Signed)
ED TO INPATIENT HANDOFF REPORT  ED Nurse Name and Phone #: [place] Mam [phone]  S Name/Age/Gender [name] 89 y.o. female Room/Bed: 003C/003C  Code Status   Code Status: DNR  Home/SNF/Other Home Patient oriented to: self and place   Triage Complete: Triage complete  Chief Complaint COVID-19 [U07.1] Pneumonia due to COVID-19 virus [U07.1, J12.82]  Triage Note No notes on file   Allergies Allergies  Allergen Reactions   Amoxicillin Other (See Comments)    Unknown reaction    Azithromycin Other (See Comments)    Unknown reaction    Codeine Other (See Comments)    Unknown reaction    Erythromycin Other (See Comments)    Unknown reaction    Green Dyes Other (See Comments)    Allergic to ALL dyes   Iodine Other (See Comments)    Unknown reaction    Misc. Sulfonamide Containing Compounds    Oxycodone Other (See Comments)    Unknown reaction      Oxycodone-Acetaminophen Other (See Comments)    Unknown reaction    Penicillins Other (See Comments)    Unknown reaction    Sulfasalazine    Tramadol Hives    Level of Care/Admitting Diagnosis ED Disposition     ED Disposition  Admit   Condition  --   Comment  Hospital Area: [place] [[id number]]  Level of Care: Telemetry Medical [104]  May admit patient to [facility] or [facility] if equivalent level of care is available:: Yes  Covid Evaluation: Confirmed COVID Positive  Diagnosis: Pneumonia due to COVID-19 virus [id number]  Admitting Physician: Karie Kirks [phone]  Attending Physician: Karie Kirks [id number]  Certification:: I certify this patient will need inpatient services for at least 2 midnights  Estimated Length of Stay: 3          B Medical/Surgery History Past Medical History:  Diagnosis Date   Anticoagulant long-term use    Arrhythmia    Arthritis    Atrial fibrillation ([place])    Congestive heart failure (CHF) ([place])    Dysphagia    Esophageal reflux    Essential  hypertension    Hiatal hernia    Pulmonary embolus ([place])    Thyroid disease    Past Surgical History:  Procedure Laterality Date   AMPUTATION TOE Right 11/17/2021   Procedure: RIGHT SECOND TOE AMPUTATION;  Surgeon: Wylene Simmer, MD;  Location: [facility];  Service: Orthopedics;  Laterality: Right;   COLONOSCOPY     ELBOW SURGERY     LAPAROSCOPIC HYSTERECTOMY       A IV Location/Drains/Wounds Patient Lines/Drains/Airways Status     Active Line/Drains/Airways     Name Placement date Placement time Site Days   Peripheral IV 10/24/22 20 G Left Antecubital 10/24/22  0228  Antecubital  less than 1   Peripheral IV 10/24/22 20 G Anterior;Distal;Left Forearm 10/24/22  0659  Forearm  less than 1   External Urinary Catheter --  --  --  --   Wound / Incision (Open or Dehisced) 11/16/21 Non-pressure wound Foot Right epithelial bed showing with purulent drainage 11/16/21  0000  Foot  342   Wound / Incision (Open or Dehisced) 11/16/21 Non-pressure wound Pretibial Distal;Right purulent drainage 11/16/21  0000  Pretibial  342            Intake/Output Last 24 hours  Intake/Output Summary (Last 24 hours) at 10/24/2022 1131 Last data filed at 10/24/2022 0924 Gross per 24 hour  Intake 519.62 ml  Output --  Net 519.62 ml    Labs/Imaging Results for orders placed or performed during the hospital encounter of 10/23/22 (from the past 48 hour(s))  CBC with Differential     Status: Abnormal   Collection Time: 10/23/22  9:52 PM  Result Value Ref Range   WBC 4.2 4.0 - 10.5 K/uL   RBC 4.39 3.87 - 5.11 MIL/uL   Hemoglobin 12.1 12.0 - 15.0 g/dL   HCT 38.4 36.0 - 46.0 %   MCV 87.5 80.0 - 100.0 fL   MCH 27.6 26.0 - 34.0 pg   MCHC 31.5 30.0 - 36.0 g/dL   RDW 15.8 (H) 11.5 - 15.5 %   Platelets 117 (L) 150 - 400 K/uL    Comment: REPEATED TO VERIFY   nRBC 0.0 0.0 - 0.2 %   Neutrophils Relative % 73 %   Neutro Abs 3.1 1.7 - 7.7 K/uL   Lymphocytes Relative 12 %   Lymphs Abs 0.5 (L) 0.7 - 4.0 K/uL    Monocytes Relative 13 %   Monocytes Absolute 0.6 0.1 - 1.0 K/uL   Eosinophils Relative 1 %   Eosinophils Absolute 0.1 0.0 - 0.5 K/uL   Basophils Relative 1 %   Basophils Absolute 0.0 0.0 - 0.1 K/uL   Immature Granulocytes 0 %   Abs Immature Granulocytes 0.01 0.00 - 0.07 K/uL    Comment: Performed at [facility], [address], [place], [place] [address]  Comprehensive metabolic panel     Status: Abnormal   Collection Time: 10/23/22  9:52 PM  Result Value Ref Range   Sodium 132 (L) 135 - 145 mmol/L   Potassium 3.7 3.5 - 5.1 mmol/L    Comment: HEMOLYSIS AT THIS LEVEL MAY AFFECT RESULT   Chloride 89 (L) 98 - 111 mmol/L   CO2 28 22 - 32 mmol/L   Glucose, Bld 88 70 - 99 mg/dL    Comment: Glucose reference range applies only to samples taken after fasting for at least 8 hours.   BUN 16 8 - 23 mg/dL   Creatinine, Ser 1.57 (H) 0.44 - 1.00 mg/dL   Calcium 9.2 8.9 - 10.3 mg/dL   Total Protein 7.3 6.5 - 8.1 g/dL   Albumin 3.4 (L) 3.5 - 5.0 g/dL   AST 39 15 - 41 U/L    Comment: HEMOLYSIS AT THIS LEVEL MAY AFFECT RESULT   ALT 12 0 - 44 U/L    Comment: HEMOLYSIS AT THIS LEVEL MAY AFFECT RESULT   Alkaline Phosphatase 141 (H) 38 - 126 U/L   Total Bilirubin 2.0 (H) 0.3 - 1.2 mg/dL    Comment: HEMOLYSIS AT THIS LEVEL MAY AFFECT RESULT   GFR, Estimated 31 (L) >60 mL/min    Comment: (NOTE) Calculated using the CKD-EPI Creatinine Equation (2021)    Anion gap 15 5 - 15    Comment: Performed at [facility], [place] [address], [place], [place] [address]  Troponin I (High Sensitivity)     Status: Abnormal   Collection Time: 10/23/22  9:52 PM  Result Value Ref Range   Troponin I (High Sensitivity) 29 (H) <18 ng/L    Comment: (NOTE) Elevated high sensitivity troponin I (hsTnI) values and significant  changes across serial measurements may suggest ACS but many other  chronic and acute conditions are known to elevate hsTnI results.  Refer to the "Links" section for chest pain algorithms  and additional  guidance. Performed at [facility], [place] [address] [place], [place] [address]  Brain natriuretic peptide     Status: Abnormal   Collection Time: 10/23/22  9:52 PM  Result Value Ref Range   B Natriuretic Peptide 373.1 (H) 0.0 - 100.0 pg/mL    Comment: Performed at [facility] [address], [place], [place] [address]  Protime-INR     Status: Abnormal   Collection Time: 10/23/22  9:52 PM  Result Value Ref Range   Prothrombin Time 26.2 (H) 11.4 - 15.2 seconds   INR 2.4 (H) 0.8 - 1.2    Comment: (NOTE) INR goal varies based on device and disease states. Performed at [facility], [place] [address], [place], [place] [address]   Resp panel by RT-PCR (RSV, Flu A&B, Covid) Anterior Nasal Swab     Status: Abnormal   Collection Time: 10/23/22  9:56 PM   Specimen: Anterior Nasal Swab  Result Value Ref Range   SARS Coronavirus 2 by RT PCR POSITIVE (A) NEGATIVE   Influenza A by PCR NEGATIVE NEGATIVE   Influenza B by PCR NEGATIVE NEGATIVE    Comment: (NOTE) The Xpert Xpress SARS-CoV-2/FLU/RSV plus assay is intended as an aid in the diagnosis of influenza from Nasopharyngeal swab specimens and should not be used as a sole basis for treatment. Nasal washings and aspirates are unacceptable for Xpert Xpress SARS-CoV-2/FLU/RSV testing.  Fact Sheet for Patients: EntrepreneurPulse.com.au  Fact Sheet for Healthcare Providers: IncredibleEmployment.be  This test is not yet approved or cleared by the [place] FDA and has been authorized for detection and/or diagnosis of SARS-CoV-2 by FDA under an Emergency Use Authorization (EUA). This EUA will remain in effect (meaning this test can be used) for the duration of the COVID-19 declaration under Section 564(b)(1) of the Act, 21 U.S.C. section 360bbb-3(b)(1), unless the authorization is terminated or revoked.     Resp Syncytial Virus by PCR NEGATIVE NEGATIVE    Comment:  (NOTE) Fact Sheet for Patients: EntrepreneurPulse.com.au  Fact Sheet for Healthcare Providers: IncredibleEmployment.be  This test is not yet approved or cleared by the [place] FDA and has been authorized for detection and/or diagnosis of SARS-CoV-2 by FDA under an Emergency Use Authorization (EUA). This EUA will remain in effect (meaning this test can be used) for the duration of the COVID-19 declaration under Section 564(b)(1) of the Act, 21 U.S.C. section 360bbb-3(b)(1), unless the authorization is terminated or revoked.  Performed at [facility], [place] [address] [place], [place] [address]   Troponin I (High Sensitivity)     Status: Abnormal   Collection Time: 10/23/22 11:19 PM  Result Value Ref Range   Troponin I (High Sensitivity) 29 (H) <18 ng/L    Comment: (NOTE) Elevated high sensitivity troponin I (hsTnI) values and significant  changes across serial measurements may suggest ACS but many other  chronic and acute conditions are known to elevate hsTnI results.  Refer to the "Links" section for chest pain algorithms and additional  guidance. Performed at [facility], [place] [address], [place],  [id number]   Basic metabolic panel     Status: Abnormal   Collection Time: 10/23/22 11:19 PM  Result Value Ref Range   Sodium 135 135 - 145 mmol/L   Potassium 3.3 (L) 3.5 - 5.1 mmol/L   Chloride 92 (L) 98 - 111 mmol/L   CO2 27 22 - 32 mmol/L   Glucose, Bld 86 70 - 99 mg/dL    Comment: Glucose reference range applies only to samples taken after fasting for at least  8 hours.   BUN 16 8 - 23 mg/dL   Creatinine, Ser 1.47 (H) 0.44 - 1.00 mg/dL   Calcium 8.8 (L) 8.9 - 10.3 mg/dL   GFR, Estimated 33 (L) >60 mL/min    Comment: (NOTE) Calculated using the CKD-EPI Creatinine Equation (2021)    Anion gap 16 (H) 5 - 15    Comment: Performed at [facility] [address], [place], [place] [address]   DG Chest Portable  1 View  Result Date: 10/23/2022 CLINICAL DATA:  Shortness of breath EXAM: PORTABLE CHEST 1 VIEW COMPARISON:  10/10/2022 FINDINGS: Diffuse cardiac enlargement. Pulmonary vascular congestion. Perihilar and basilar interstitial changes likely edema. Small bilateral pleural effusions. There is mild progression since prior study. No pneumothorax. Apical pleural calcifications, likely postinflammatory. Calcification of the aorta. Degenerative changes in the shoulders. IMPRESSION: Cardiac enlargement with pulmonary vascular congestion, interstitial edema, and small bilateral pleural effusions, progressing since prior study. Electronically Signed   By: Lucienne Capers M.D.   On: 10/23/2022 21:51    Pending Labs Unresulted Labs (From admission, onward)     Start     Ordered   10/25/22 0500  Protime-INR  Daily,   R      10/24/22 0113   10/24/22 [id number]  Basic metabolic panel  Daily,   R     Comments: As Scheduled for 5 days    10/23/22 2352            Vitals/Pain Today's Vitals   10/24/22 0829 10/24/22 1015 10/24/22 1021 10/24/22 1130  BP:      Pulse:  64 74 66  Resp:  15 16 16  $ Temp: (!) 97.4 F (36.3 C)     TempSrc: Oral     SpO2:  98% 98% 97%  Weight:      Height:      PainSc:        Isolation Precautions Airborne and Contact precautions  Medications Medications  dexamethasone (DECADRON) tablet 6 mg (6 mg Oral Given 10/23/22 2314)  remdesivir 200 mg in sodium chloride 0.9% 250 mL IVPB (0 mg Intravenous Stopped 10/24/22 0309)    Followed by  remdesivir 100 mg in sodium chloride 0.9 % 100 mL IVPB (has no administration in time range)  sodium chloride flush (NS) 0.9 % injection 3 mL (3 mLs Intravenous Given 10/24/22 0040)  sodium chloride flush (NS) 0.9 % injection 3 mL (has no administration in time range)  0.9 %  sodium chloride infusion (has no administration in time range)  acetaminophen (TYLENOL) tablet 650 mg (650 mg Oral Given 10/24/22 0159)  ondansetron (ZOFRAN) injection 4  mg (has no administration in time range)  Warfarin - Pharmacist Dosing Inpatient (has no administration in time range)  warfarin (COUMADIN) tablet 1 mg (1 mg Oral Given 10/24/22 0200)  warfarin (COUMADIN) tablet 2 mg (has no administration in time range)  bumetanide (BUMEX) tablet 1 mg (1 mg Oral Given 10/24/22 0200)  pantoprazole (PROTONIX) EC tablet 40 mg (has no administration in time range)  levothyroxine (SYNTHROID) tablet 100 mcg (100 mcg Oral Given 10/24/22 0555)  cholecalciferol (VITAMIN D3) 25 MCG (1000 UNIT) tablet 1,000 Units (has no administration in time range)  polyvinyl alcohol (LIQUIFILM TEARS) 1.4 % ophthalmic solution 1 drop (has no administration in time range)  cyanocobalamin (VITAMIN B12) tablet 1,000 mcg (has no administration in time range)  gabapentin (NEURONTIN) capsule 300 mg (300 mg Oral Given 10/24/22 0335)  potassium chloride (KLOR-CON) packet 40 mEq (has no administration in  time range)  bumetanide (BUMEX) injection 0.5 mg (0.5 mg Intravenous Given 10/23/22 2214)  magnesium sulfate IVPB 2 g 50 mL (0 g Intravenous Stopped 10/24/22 0758)  potassium chloride SA (KLOR-CON M) CR tablet 40 mEq (40 mEq Oral Given 10/24/22 0806)    Mobility non-ambulatory     Focused Assessments Pulmonary Assessment Handoff:  Lung sounds: Bilateral Breath Sounds: Rhonchi O2 Device: Nasal Cannula O2 Flow Rate (L/min): 2 L/min    R Recommendations: See Admitting Provider Note  Report given to:   Additional Notes:

## 2022-10-24 NOTE — Plan of Care (Signed)
  Problem: Education: Goal: Knowledge of risk factors and measures for prevention of condition will improve Outcome: Progressing   Problem: Coping: Goal: Psychosocial and spiritual needs will be supported Outcome: Progressing   Problem: Respiratory: Goal: Will maintain a patent airway Outcome: Progressing Goal: Complications related to the disease process, condition or treatment will be avoided or minimized Outcome: Progressing   

## 2022-10-24 NOTE — Hospital Course (Signed)
The patient is a 89 yr old woman who was brought to the ED by her daughter, [name], out of concern for her mothers increased swelling, increasing SOB/DOE. The daughter herself had been diagnosed with COVID-19, and had concern for her mother being positive.    In the ED she was found to be hypoxic. She was positive for COVID. Chest x-ray demosntrated pulmonary vascular congestion with interstitial edema and bilateral pleural effusions that was found to be worse than prior exam. She has 4+ pitting edema of her lower extremities with weeping. She has ulcerations on her toes bilaterally.   She has been admitted to a telemetry bed earlier today by my colleague, Dr. Alcario Drought. She will receive decadron, Remdesevir and bumex. Wound care has been consulted to attend to her toes. Magnesium and potassium have been supplemented. She will be on airborne precautions. She has been continued on her home medications for atrial fibrillation and hypothyroidism.

## 2022-10-24 NOTE — Assessment & Plan Note (Signed)
Pt with COVID-19 and new O2 requirement. Unclear how much of the CXR findings represent pulm edema vs PNA from COVID-19 COVID pathway Remdesivir Decadron O2 via [place] 

## 2022-10-24 NOTE — Progress Notes (Signed)
ANTICOAGULATION CONSULT NOTE - Initial Consult  Pharmacy Consult for warfarin Indication: atrial fibrillation  Allergies  Allergen Reactions   Amoxicillin Other (See Comments)    Unknown reaction    Azithromycin Other (See Comments)    Unknown reaction    Codeine Other (See Comments)    Unknown reaction    Erythromycin Other (See Comments)    Unknown reaction    Green Dyes Other (See Comments)    Allergic to ALL dyes   Iodine Other (See Comments)    Unknown reaction    Misc. Sulfonamide Containing Compounds    Oxycodone Other (See Comments)    Unknown reaction      Oxycodone-Acetaminophen Other (See Comments)    Unknown reaction    Penicillins Other (See Comments)    Unknown reaction    Sulfasalazine    Tramadol Hives    Patient Measurements: Height: 5' 5"$  (165.1 cm) Weight: 82.6 kg (182 lb) IBW/kg (Calculated) : 57  Vital Signs: Temp: 98.1 F (36.7 C) (02/15 2146) Temp Source: Oral (02/15 2146) BP: 128/77 (02/16 0030) Pulse Rate: 85 (02/16 0030)  Labs: Recent Labs    10/23/22 2152 10/23/22 2319  HGB 12.1  --   HCT 38.4  --   PLT 117*  --   LABPROT 26.2*  --   INR 2.4*  --   CREATININE 1.57*  --   TROPONINIHS 29* 29*    Estimated Creatinine Clearance: 24.3 mL/min (A) (by C-G formula based on SCr of 1.57 mg/dL (H)).   Medical History: Past Medical History:  Diagnosis Date   Anticoagulant long-term use    Arrhythmia    Arthritis    Atrial fibrillation (HCC)    Congestive heart failure (CHF) (HCC)    Dysphagia    Esophageal reflux    Essential hypertension    Hiatal hernia    Pulmonary embolus (HCC)    Thyroid disease     Assessment: 89yo female admitted for hypoxia in the setting of COVID and anasarca, to continue Coumadin for Afib; current INR is at goal with last dose of Coumadin taken 2/14 PTA.  Goal of Therapy:  INR 2-3   Plan:  Continue home Coumadin regimen of 45m TTSS and 245mMWF. Monitor INR.  VeWynona NeatPharmD, BCPS   10/24/2022,1:09 AM

## 2022-10-24 NOTE — Assessment & Plan Note (Addendum)
Unclear how much of CXR findings represent CHF vs COVID PNA. Pt got IV bumex in ED Does seem to have significant peripheral edema, which is reportedly worse than baseline. Ill just continue home PO bumex dose for now and defer to day team if they want additional IV bumex during stay. Strict intake and output.

## 2022-10-24 NOTE — ED Notes (Signed)
Patient repositioned to help with pain in her butt.

## 2022-10-24 NOTE — Consult Note (Signed)
[facility] Nurse Consult Note: Reason for Consult: Consult requested for toes.  Pt is in isolation for Covid.  Left 4th toe tip has a nailbed that is eventually going to die and fall off, it is dark brown-black but intact; no open wound or drainage Right plantar great toe with dry yellow callous, slightly raised above skin level, no open wound or drainage Right 3rd toe with brown dry callous .3X.3cm, no open wound or drainage Dressing procedure/placement/frequency: Topical treatment orders provided for bedside nurses to perform as follows to promote moist healing: Apply Bactroban to left 4th toe and right great toe/right 3rd toe callous areas Q day, may leave open to air. Pt should follow-up with a podiatrist after discharge for further plan of care. Please re-consult if further assistance is needed.  Thank-you,  Julien Girt MSN, [place], [place], [place], [place]

## 2022-10-24 NOTE — Assessment & Plan Note (Signed)
Due to COVID-19 / pulmonary edema as discussed above.

## 2022-10-24 NOTE — H&P (Signed)
History and Physical    Patient: [name] [id number] DOB: 05/19/1928 DOA: 10/23/2022 DOS: the patient was seen and examined on 10/24/2022 PCP: Saintclair Halsted, FNP  Patient coming from: Home  Chief Complaint:  Chief Complaint  Patient presents with   Edema    BIB EMS from home for bilateral edema in lower legs, seeing wound MD for wounds on legs, hx of fluid on her lungs, no hx of COPD, hx of controlled afib, no complaints of SOB at current, VSS, reports daughter currently has covid   Shortness of Breath   HPI: [name] is a 89 y.o. female with medical history significant of dCHF, A.Fib, HTN, PAH.  Pt on chronic coumadin for prior PE and A.Fib.  Pt lives at home with daughter.  Daughter has COVID-19.  Pt with worsening BLE edema recently, increased SOB / DOE.  Pt in to ED.  No CP, no cough.   Review of Systems: As mentioned in the history of present illness. All other systems reviewed and are negative. Past Medical History:  Diagnosis Date   Anticoagulant long-term use    Arrhythmia    Arthritis    Atrial fibrillation (HCC)    Congestive heart failure (CHF) (HCC)    Dysphagia    Esophageal reflux    Essential hypertension    Hiatal hernia    Pulmonary embolus ([place])    Thyroid disease    Past Surgical History:  Procedure Laterality Date   AMPUTATION TOE Right 11/17/2021   Procedure: RIGHT SECOND TOE AMPUTATION;  Surgeon: Wylene Simmer, MD;  Location: [facility];  Service: Orthopedics;  Laterality: Right;   COLONOSCOPY     ELBOW SURGERY     LAPAROSCOPIC HYSTERECTOMY     Social History:  reports that she quit smoking about 21 years ago. Her smoking use included cigarettes. She smoked an average of 1 pack per day. She has never used smokeless tobacco. No history on file for alcohol use and drug use.  Allergies  Allergen Reactions   Amoxicillin Other (See Comments)    Unknown reaction    Azithromycin Other (See Comments)    Unknown reaction    Codeine Other (See  Comments)    Unknown reaction    Erythromycin Other (See Comments)    Unknown reaction    Green Dyes Other (See Comments)    Allergic to ALL dyes   Iodine Other (See Comments)    Unknown reaction    Misc. Sulfonamide Containing Compounds    Oxycodone Other (See Comments)    Unknown reaction      Oxycodone-Acetaminophen Other (See Comments)    Unknown reaction    Penicillins Other (See Comments)    Unknown reaction    Sulfasalazine    Tramadol Hives    Family History  Problem Relation Age of Onset   Stroke Mother    Cancer Brother     Prior to Admission medications   Medication Sig Start Date End Date Taking? Authorizing Provider  acetaminophen (TYLENOL) 325 MG tablet Take 650 mg by mouth daily as needed for moderate pain.   Yes [provider]  Apoaequorin (PREVAGEN PO) Take 1 capsule by mouth daily.   Yes [provider]  bumetanide (BUMEX) 1 MG tablet Take 1 mg by mouth 2 (two) times daily. 2 am 1 pm 6 hours apart, not after 6 pm 12/17/21  Yes [provider]  carboxymethylcellulose (REFRESH PLUS) 0.5 % SOLN Place 1 drop into both eyes 2 (two)  times daily.   Yes [provider]  cholecalciferol (VITAMIN D3) 25 MCG (1000 UT) tablet Take 1,000 Units by mouth daily.   Yes [provider]  clotrimazole-betamethasone (LOTRISONE) cream Apply 1 application. topically 2 (two) times daily as needed (rash/irritation). 11/09/21  Yes [provider]  gabapentin (NEURONTIN) 300 MG capsule Take 300 mg by mouth 2 (two) times daily.    Yes [provider]  levothyroxine (SYNTHROID) 100 MCG tablet Take 100 mcg by mouth every morning. 09/05/21  Yes [provider]  Multiple Vitamins-Minerals (PRESERVISION/LUTEIN) CAPS Take 1 capsule by mouth 2 (two) times daily.    Yes [provider]  pantoprazole (PROTONIX) 40 MG tablet Take 40 mg by mouth daily.   Yes [provider]  vitamin B-12 (CYANOCOBALAMIN) 1000  MCG tablet Take 1,000 mcg by mouth daily.   Yes [provider]  warfarin (COUMADIN) 1 MG tablet Take 1-2 mg by mouth See admin instructions. Take 15m by mouth on Tues, Thur, Saturday & Sunday, Then take  2 mg on Monday, Wednesday, Friday.   Yes [provider]  metoprolol succinate (TOPROL-XL) 50 MG 24 hr tablet TAKE 1 TABLET BY MOUTH EVERY DAY WITH OR IMMEDIATELY FOLLOWING A MEAL Patient not taking: Reported on 10/24/2022 02/04/22   CBuford Dresser MD    Physical Exam: Vitals:   10/23/22 2330 10/24/22 0000 10/24/22 0030 10/24/22 0100  BP: 134/75 133/75 128/77 120/66  Pulse: 83 87 85 83  Resp: (!) 22 18 (!) 21   Temp:      TempSrc:      SpO2: 98% 99% 96% 94%  Weight:      Height:       Constitutional: NAD, calm, comfortable Respiratory: Rhonchi present Cardiovascular: IRR, IRR, no murmurs / rubs / gallops. 2+ BLE edema. Abdomen: no tenderness, no masses palpated. No hepatosplenomegaly. Bowel sounds positive.  Neurologic: CN 2-12 grossly intact. Sensation intact, DTR normal. Strength 5/5 in all 4.  Psychiatric: Normal judgment and insight. Alert and oriented x 3. Normal mood.   Data Reviewed:       Latest Ref Rng & Units 10/23/2022    9:52 PM 02/21/2022    7:12 AM 11/21/2021    5:32 AM  CBC  WBC 4.0 - 10.5 K/uL 4.2   6.2   Hemoglobin 12.0 - 15.0 g/dL 12.1  12.2  11.4   Hematocrit 36.0 - 46.0 % 38.4  36.0  34.6   Platelets 150 - 400 K/uL 117   195       Latest Ref Rng & Units 10/23/2022    9:52 PM 02/21/2022    8:35 AM 02/21/2022    7:12 AM  CMP  Glucose 70 - 99 mg/dL 88  86  92   BUN 8 - 23 mg/dL 16  47  48   Creatinine 0.44 - 1.00 mg/dL 1.57  2.08  2.10   Sodium 135 - 145 mmol/L 132  136  136   Potassium 3.5 - 5.1 mmol/L 3.7  4.6  5.0   Chloride 98 - 111 mmol/L 89  103  101   CO2 22 - 32 mmol/L 28  21    Calcium 8.9 - 10.3 mg/dL 9.2  9.5    Total Protein 6.5 - 8.1 g/dL 7.3     Total Bilirubin 0.3 - 1.2 mg/dL 2.0     Alkaline Phos 38 - 126 U/L  141     AST 15 - 41 U/L 39     ALT  0 - 44 U/L 12      COVID-19 positive  CXR: IMPRESSION: Cardiac enlargement with pulmonary vascular congestion, interstitial edema, and small bilateral pleural effusions, progressing since prior study.  Assessment and Plan: * COVID-19 Pt with COVID-19 and new O2 requirement. Unclear how much of the CXR findings represent pulm edema vs PNA from COVID-19 COVID pathway Remdesivir Decadron O2 via [place]  Hypoxia Due to COVID-19 / pulmonary edema as discussed above.  Acute on chronic diastolic CHF (congestive heart failure) ([place]) Unclear how much of CXR findings represent CHF vs COVID PNA. Pt got IV bumex in ED Does seem to have significant peripheral edema, which is reportedly worse than baseline. Ill just continue home PO bumex dose for now and defer to day team if they want additional IV bumex during stay. Strict intake and output.  Personal history of pulmonary embolism Looks like she takes coumadin chronically for this and for permanent a.fib INR today is theraputic at 2.4 Continue coumadin per pharm  Permanent atrial fibrillation (HCC) Cont coumadin BB stopped recently due to hypotension.  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 Observation status is judged to be reasonable and necessary in order to provide the required intensity of service to ensure the patient's safety. The patient's presenting symptoms, physical exam findings, and initial radiographic and laboratory data in the context of their medical condition is felt to place them at decreased risk for further clinical deterioration. Furthermore, it is anticipated that the patient will be medically stable for discharge from the hospital within 2 midnights of  admission.   Author: Etta Quill., DO 10/24/2022 1:22 AM  For on call review [email or web address].

## 2022-10-25 ENCOUNTER — Inpatient Hospital Stay (HOSPITAL_COMMUNITY): Payer: Medicare HMO

## 2022-10-25 DIAGNOSIS — N1832 Chronic kidney disease, stage 3b: Secondary | ICD-10-CM | POA: Diagnosis not present

## 2022-10-25 DIAGNOSIS — I5033 Acute on chronic diastolic (congestive) heart failure: Secondary | ICD-10-CM

## 2022-10-25 DIAGNOSIS — U071 COVID-19: Secondary | ICD-10-CM | POA: Diagnosis not present

## 2022-10-25 DIAGNOSIS — R0902 Hypoxemia: Secondary | ICD-10-CM | POA: Diagnosis not present

## 2022-10-25 LAB — PROTIME-INR
INR: 3.6 — ABNORMAL HIGH (ref 0.8–1.2)
Prothrombin Time: 35.5 seconds — ABNORMAL HIGH (ref 11.4–15.2)

## 2022-10-25 LAB — PROCALCITONIN: Procalcitonin: <0.1 ng/mL

## 2022-10-25 LAB — BASIC METABOLIC PANEL
Anion gap: 12 (ref 5–15)
BUN: 18 mg/dL (ref 8–23)
CO2: 29 mmol/L (ref 22–32)
Calcium: 8.4 mg/dL — ABNORMAL LOW (ref 8.9–10.3)
Chloride: 90 mmol/L — ABNORMAL LOW (ref 98–111)
Creatinine, Ser: 1.47 mg/dL — ABNORMAL HIGH (ref 0.44–1.00)
GFR, Estimated: 33 mL/min — ABNORMAL LOW (ref >60)
Glucose, Bld: 174 mg/dL — ABNORMAL HIGH (ref 70–99)
Potassium: 3.5 mmol/L (ref 3.5–5.1)
Sodium: 131 mmol/L — ABNORMAL LOW (ref 135–145)

## 2022-10-25 LAB — ECHOCARDIOGRAM COMPLETE BUBBLE STUDY
AR max vel: 1.48 cm2
AV Area VTI: 1.1 cm2
AV Area mean vel: 1.34 cm2
AV Mean grad: 39 mmHg
AV Peak grad: 26.1 mmHg
Ao pk vel: 2.56 m/s
S' Lateral: 2.3 cm

## 2022-10-25 LAB — D-DIMER, QUANTITATIVE: D-Dimer, Quant: 0.66 ug/mL-FEU — ABNORMAL HIGH (ref 0.00–0.50)

## 2022-10-25 LAB — C-REACTIVE PROTEIN: CRP: 1.3 mg/dL — ABNORMAL HIGH (ref <1.0)

## 2022-10-25 LAB — LACTATE DEHYDROGENASE: LDH: 130 U/L (ref 98–192)

## 2022-10-25 NOTE — Assessment & Plan Note (Signed)
-   patient has history of CKD3b. Baseline creat ~ 1.4, eGFR 33

## 2022-10-25 NOTE — Plan of Care (Signed)
  Problem: Coping: Goal: Psychosocial and spiritual needs will be supported Outcome: Progressing   Problem: Respiratory: Goal: Will maintain a patent airway Outcome: Progressing   

## 2022-10-25 NOTE — Progress Notes (Signed)
Echocardiogram 2D Echocardiogram has been performed.  Ronny Flurry 10/25/2022, 4:42 PM

## 2022-10-25 NOTE — Progress Notes (Signed)
[facility] for warfarin Indication: atrial fibrillation  Allergies  Allergen Reactions   Amoxicillin Other (See Comments)    Unknown reaction    Azithromycin Other (See Comments)    Unknown reaction    Codeine Other (See Comments)    Unknown reaction    Erythromycin Other (See Comments)    Unknown reaction    Green Dyes Other (See Comments)    Allergic to ALL dyes   Iodine Other (See Comments)    Unknown reaction    Misc. Sulfonamide Containing Compounds    Oxycodone Other (See Comments)    Unknown reaction      Oxycodone-Acetaminophen Other (See Comments)    Unknown reaction    Penicillins Other (See Comments)    Unknown reaction    Sulfasalazine    Tramadol Hives    Patient Measurements: Height: 5' 5"$  (165.1 cm) Weight: 82.6 kg (182 lb) IBW/kg (Calculated) : 57  Vital Signs: Temp: 97.7 F (36.5 C) (02/17 0359) Temp Source: Oral (02/17 0359) BP: 140/104 (02/17 0359) Pulse Rate: 84 (02/17 0359)  Labs: Recent Labs    10/23/22 2152 10/23/22 2319 10/25/22 0247  HGB 12.1  --   --   HCT 38.4  --   --   PLT 117*  --   --   LABPROT 26.2*  --  35.5*  INR 2.4*  --  3.6*  CREATININE 1.57* 1.47* 1.47*  TROPONINIHS 29* 29*  --      Estimated Creatinine Clearance: 25.9 mL/min (A) (by C-G formula based on SCr of 1.47 mg/dL (H)).  Assessment: 89yo female admitted for hypoxia in the setting of COVID and anasarca, to continue Coumadin for Afib  INR elevated this AM  Goal of Therapy:  INR 2-3   Plan:  Hold warfarin Daily INR  Thank you Anette Guarneri, PharmD  10/25/2022,8:32 AM

## 2022-10-25 NOTE — Progress Notes (Signed)
Progress Note    [name]   [id number]  DOB: 10-Jun-1928  DOA: 10/23/2022     1 PCP: Saintclair Halsted, FNP  Initial CC: SOB, swelling  Hospital Course: [name] is a 89 yr old female with PMH A-fib, CHF, dysphagia, HTN, GERD, history of PE, hypothyroidism who was brought to the hospital with worsening shortness of breath and edema.  Patient resides at home with her daughter who was noted to have COVID-19.  On testing after admission, patient was found to be positive for COVID and started on treatment.  Interval History:  No events overnight.  Breathing seems to be comfortable this morning.  Still remains on 3 L oxygen.  Assessment and Plan: * COVID-19 - Testing positive for COVID-19 on admission with new hypoxia - Continue remdesivir, Decadron - Continue trending inflammatory markers - Continue COVID precautions - check PCT  Hypoxia - Due to COVID-19 / pulmonary edema as discussed above. -Not on oxygen at home, wean as able  Acute on chronic diastolic CHF (congestive heart failure) (HCC) - Unclear how much of CXR findings represent CHF vs COVID PNA - BNP elevated; pulmonary edema, small pleural effusions; DOE; pulmonary crackles, B/L leg edema - continue bumex - follow up echo  Chronic kidney disease, stage 3b (HCC) - patient has history of CKD3b. Baseline creat ~ 1.4, eGFR 33   Personal history of pulmonary embolism Looks like she takes coumadin chronically for this and for permanent a.fib - continue coumadin per pharmacy  Permanent atrial fibrillation ([place]) Cont coumadin BB stopped recently due to hypotension.  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 Level 1 (Bedfast) - Unable to balance while sitting on edge of bed       Is the above level different from baseline mobility prior to current illness? Yes - Recommend PT order                Barriers to discharge:  Disposition Plan:  Home 2-3 days Status is: Inpt  Objective: Blood pressure (!) 138/98, pulse 88, temperature 98 F (36.7 C), temperature source Oral, resp. rate 18, height 5' 5"$  (1.651 m), weight 82.6 kg, SpO2 98 %.  Examination:  Physical Exam Constitutional:      Appearance: She is well-developed.  HENT:     Head: Normocephalic and atraumatic.     Mouth/Throat:     Mouth: Mucous membranes are moist.  Eyes:     Extraocular Movements: Extraocular movements intact.  Cardiovascular:     Rate and Rhythm: Normal rate.  Pulmonary:     Effort: Pulmonary effort is normal. No respiratory distress.     Breath sounds: Rhonchi present.  Abdominal:     General: Bowel sounds are normal. There is no distension.     Palpations: Abdomen is soft.     Tenderness: There is no abdominal tenderness.  Musculoskeletal:        General: Swelling (2+ B/L  LE edema) present. Normal range of motion.     Cervical back: Normal range of motion and neck supple.  Skin:    General: Skin is warm and dry.  Neurological:     Mental Status: She is alert. Mental status is at baseline.  Psychiatric:        Mood and Affect: Mood normal.      Consultants:    Procedures:    Data Reviewed: Results for orders placed or performed during the hospital encounter of 10/23/22 (from the past 24 hour(s))  Basic metabolic panel     Status: Abnormal   Collection Time: 10/25/22  2:47 AM  Result Value Ref Range   Sodium 131 (L) 135 - 145 mmol/L   Potassium 3.5 3.5 - 5.1 mmol/L   Chloride 90 (L) 98 - 111 mmol/L   CO2 29 22  - 32 mmol/L   Glucose, Bld 174 (H) 70 - 99 mg/dL   BUN 18 8 - 23 mg/dL   Creatinine, Ser 1.47 (H) 0.44 - 1.00 mg/dL   Calcium 8.4 (L) 8.9 - 10.3 mg/dL   GFR, Estimated 33 (L) >60 mL/min   Anion gap 12 5 - 15  Protime-INR     Status: Abnormal   Collection Time: 10/25/22  2:47 AM  Result Value Ref Range   Prothrombin Time 35.5 (H) 11.4 - 15.2 seconds   INR 3.6 (H) 0.8 - 1.2  C-reactive protein     Status: Abnormal   Collection Time: 10/25/22  9:57 AM  Result Value Ref Range   CRP 1.3 (H) <1.0 mg/dL  D-dimer, quantitative     Status: Abnormal   Collection Time: 10/25/22  9:57 AM  Result Value Ref Range   D-Dimer, Quant 0.66 (H) 0.00 - 0.50 ug/mL-FEU  Lactate dehydrogenase     Status: None   Collection Time: 10/25/22  9:57 AM  Result Value Ref Range   LDH 130 98 - 192 U/L    I have reviewed pertinent nursing notes, vitals, labs, and images as necessary. I have ordered labwork to follow up on as indicated.  I have reviewed the last notes from staff over past 24 hours. I have discussed patient's care plan and test results with nursing staff, CM/SW, and other staff as appropriate.  Time spent: Greater than 50% of the 55 minute visit was spent in counseling/coordination of care for the patient as laid out in the A&P.   LOS: 1 day   Dwyane Dee, MD [facility] 10/25/2022, 12:54 PM

## 2022-10-26 DIAGNOSIS — J9601 Acute respiratory failure with hypoxia: Secondary | ICD-10-CM | POA: Diagnosis not present

## 2022-10-26 DIAGNOSIS — I350 Nonrheumatic aortic (valve) stenosis: Secondary | ICD-10-CM | POA: Diagnosis not present

## 2022-10-26 DIAGNOSIS — I272 Pulmonary hypertension, unspecified: Secondary | ICD-10-CM | POA: Diagnosis not present

## 2022-10-26 DIAGNOSIS — I5023 Acute on chronic systolic (congestive) heart failure: Secondary | ICD-10-CM

## 2022-10-26 DIAGNOSIS — U071 COVID-19: Secondary | ICD-10-CM | POA: Diagnosis not present

## 2022-10-26 DIAGNOSIS — I5033 Acute on chronic diastolic (congestive) heart failure: Secondary | ICD-10-CM | POA: Diagnosis not present

## 2022-10-26 DIAGNOSIS — J1282 Pneumonia due to coronavirus disease 2019: Secondary | ICD-10-CM

## 2022-10-26 DIAGNOSIS — R0902 Hypoxemia: Secondary | ICD-10-CM | POA: Diagnosis not present

## 2022-10-26 LAB — CBC WITH DIFFERENTIAL/PLATELET
Abs Immature Granulocytes: 0.01 10*3/uL (ref 0.00–0.07)
Basophils Absolute: 0 10*3/uL (ref 0.0–0.1)
Basophils Relative: 1 %
Eosinophils Absolute: 0.1 10*3/uL (ref 0.0–0.5)
Eosinophils Relative: 3 %
HCT: 33.3 % — ABNORMAL LOW (ref 36.0–46.0)
Hemoglobin: 10.1 g/dL — ABNORMAL LOW (ref 12.0–15.0)
Immature Granulocytes: 0 %
Lymphocytes Relative: 19 %
Lymphs Abs: 0.7 10*3/uL (ref 0.7–4.0)
MCH: 26.6 pg (ref 26.0–34.0)
MCHC: 30.3 g/dL (ref 30.0–36.0)
MCV: 87.9 fL (ref 80.0–100.0)
Monocytes Absolute: 0.5 10*3/uL (ref 0.1–1.0)
Monocytes Relative: 14 %
Neutro Abs: 2.3 10*3/uL (ref 1.7–7.7)
Neutrophils Relative %: 63 %
Platelets: 145 10*3/uL — ABNORMAL LOW (ref 150–400)
RBC: 3.79 MIL/uL — ABNORMAL LOW (ref 3.87–5.11)
RDW: 15.9 % — ABNORMAL HIGH (ref 11.5–15.5)
WBC: 3.6 10*3/uL — ABNORMAL LOW (ref 4.0–10.5)
nRBC: 0 % (ref 0.0–0.2)

## 2022-10-26 LAB — BASIC METABOLIC PANEL
Anion gap: 11 (ref 5–15)
BUN: 20 mg/dL (ref 8–23)
CO2: 31 mmol/L (ref 22–32)
Calcium: 8.5 mg/dL — ABNORMAL LOW (ref 8.9–10.3)
Chloride: 88 mmol/L — ABNORMAL LOW (ref 98–111)
Creatinine, Ser: 1.38 mg/dL — ABNORMAL HIGH (ref 0.44–1.00)
GFR, Estimated: 36 mL/min — ABNORMAL LOW (ref >60)
Glucose, Bld: 105 mg/dL — ABNORMAL HIGH (ref 70–99)
Potassium: 2.9 mmol/L — ABNORMAL LOW (ref 3.5–5.1)
Sodium: 130 mmol/L — ABNORMAL LOW (ref 135–145)

## 2022-10-26 LAB — MAGNESIUM: Magnesium: 1.8 mg/dL (ref 1.7–2.4)

## 2022-10-26 LAB — PROTIME-INR
INR: 2.5 — ABNORMAL HIGH (ref 0.8–1.2)
Prothrombin Time: 26.3 seconds — ABNORMAL HIGH (ref 11.4–15.2)

## 2022-10-26 LAB — PROCALCITONIN: Procalcitonin: <0.1 ng/mL

## 2022-10-26 LAB — LACTATE DEHYDROGENASE: LDH: 136 U/L (ref 98–192)

## 2022-10-26 LAB — D-DIMER, QUANTITATIVE: D-Dimer, Quant: 0.6 ug/mL-FEU — ABNORMAL HIGH (ref 0.00–0.50)

## 2022-10-26 LAB — C-REACTIVE PROTEIN: CRP: 1.1 mg/dL — ABNORMAL HIGH (ref <1.0)

## 2022-10-26 MED ORDER — WARFARIN SODIUM 2 MG PO TABS
2.0000 mg | ORAL_TABLET | Freq: Once | ORAL | Status: AC
  Administered 2022-10-26: 2 mg via ORAL
  Filled 2022-10-26: qty 1

## 2022-10-26 MED ORDER — POTASSIUM CHLORIDE CRYS ER 20 MEQ PO TBCR
40.0000 meq | EXTENDED_RELEASE_TABLET | Freq: Two times a day (BID) | ORAL | Status: AC
  Administered 2022-10-26 (×2): 40 meq via ORAL
  Filled 2022-10-26 (×2): qty 2

## 2022-10-26 MED ORDER — POTASSIUM CHLORIDE CRYS ER 20 MEQ PO TBCR: 40.0000 meq | EXTENDED_RELEASE_TABLET | Freq: Three times a day (TID) | ORAL | Status: DC

## 2022-10-26 MED ORDER — MAGNESIUM SULFATE 2 GM/50ML IV SOLN
2.0000 g | Freq: Once | INTRAVENOUS | Status: AC
  Administered 2022-10-26: 2 g via INTRAVENOUS
  Filled 2022-10-26: qty 50

## 2022-10-26 MED ORDER — SPIRONOLACTONE 12.5 MG HALF TABLET
12.5000 mg | ORAL_TABLET | Freq: Every day | ORAL | Status: DC
  Administered 2022-10-26 – 2022-11-01 (×7): 12.5 mg via ORAL
  Filled 2022-10-26 (×7): qty 1

## 2022-10-26 MED ORDER — LORAZEPAM 0.5 MG PO TABS
0.2500 mg | ORAL_TABLET | Freq: Once | ORAL | Status: AC
  Administered 2022-10-26: 0.25 mg via ORAL
  Filled 2022-10-26: qty 1

## 2022-10-26 NOTE — Progress Notes (Signed)
Dr Marlowe Sax would like to give lorazepam 0.87m PO x1.  MOnnie Boer PharmD, BCIDP, AAHIVP, CPP Infectious Disease Pharmacist 10/26/2022 9:54 PM

## 2022-10-26 NOTE — Progress Notes (Signed)
Progress Note    [name]   [id number]  DOB: Mar 02, 1928  DOA: 10/23/2022     2 PCP: Saintclair Halsted, FNP  Initial CC: SOB, swelling  Hospital Course: [name] is a 89 yr old female with PMH A-fib, CHF, dysphagia, HTN, GERD, history of PE, hypothyroidism who was brought to the hospital with worsening shortness of breath and edema.  Patient resides at home with her daughter who was noted to have COVID-19.  On testing after admission, patient was found to be positive for COVID and started on treatment.  Interval History:  No events overnight.  Breathing seems to be comfortable this morning.  O2 weaned some since yesterday, on 2 L this morning.  Assessment and Plan: * COVID-19 - Testing positive for COVID-19 on admission with new hypoxia - Continue remdesivir, Decadron - Continue trending inflammatory markers - Continue COVID precautions - PCT remains negative  Hypoxia - Due to COVID-19 / pulmonary edema as discussed above. -Not on oxygen at home, wean as able  Acute on chronic diastolic CHF (congestive heart failure) (HCC) - Unclear how much of CXR findings represent CHF vs COVID PNA - BNP elevated; pulmonary edema, small pleural effusions; DOE; pulmonary crackles, B/L leg edema - continue bumex -Echo updated: Severe AS new.  EF 60 to 65%, no RWMA, mild LVH.  RV moderately enlarged and elevated PA pressures which are previously known.  Severely dilated LA and RA.  Small pericardial effusion  Chronic kidney disease, stage 3b ([name]) - patient has history of CKD3b. Baseline creat ~ 1.4, eGFR 33   Severe aortic stenosis - per echo read this appears new. Per report: "Apical doppler suggests severe AS though signal could be contaminated " -Appears to have low mobility at baseline but given new findings, will consult cardiology for further input in case of need for medication adjustment  Personal history of pulmonary embolism Looks like she takes coumadin chronically for this  and for permanent a.fib - continue coumadin per pharmacy  Permanent atrial fibrillation ([name]) Cont coumadin BB stopped recently due to hypotension.  Chronic thromboembolic pulmonary hypertension (HCC) [name] due to prior PEs   Old records reviewed in assessment of this patient  Antimicrobials: Remdesivir 2/16 >> 10/26/2022  DVT prophylaxis:  Coumadinwarfarin (COUMADIN) tablet 2 mg   Code Status:   Code Status: DNR  Mobility Assessment (last 72 hours)     Mobility Assessment     Row Name 10/24/22 1343           Does patient have an order for bedrest or is patient medically unstable No - Continue assessment       What is the highest level of mobility based on the progressive mobility assessment? Level 1 (Bedfast) - Unable to balance while sitting on edge of bed       Is the above level different from baseline mobility prior to current illness? Yes - Recommend PT order                Barriers to discharge:  Disposition Plan:  Home 2-3 days Status is: Inpt  Objective: Blood pressure 121/60, pulse 77, temperature 98 F (36.7 C), temperature source Oral, resp. rate 20, height 5' 5"$  (1.651 m), weight 82.6 kg, SpO2 98 %.  Examination:  Physical Exam Constitutional:      Appearance: She is well-developed.  HENT:     Head: Normocephalic and atraumatic.     Mouth/Throat:     Mouth: Mucous membranes are moist.  Eyes:     Extraocular Movements: Extraocular movements intact.  Cardiovascular:     Rate and Rhythm: Normal rate.  Pulmonary:     Effort: Pulmonary effort is normal. No respiratory distress.     Breath sounds: Rhonchi present.  Abdominal:     General: Bowel sounds are normal. There is no distension.     Palpations: Abdomen is soft.     Tenderness: There is no abdominal tenderness.  Musculoskeletal:        General: Swelling (2+ B/L LE edema) present. Normal range of motion.     Cervical back: Normal range of motion and neck supple.  Skin:    General: Skin  is warm and dry.  Neurological:     Mental Status: She is alert. Mental status is at baseline.  Psychiatric:        Mood and Affect: Mood normal.      Consultants:  Cardiology  Procedures:    Data Reviewed: Results for orders placed or performed during the hospital encounter of 10/23/22 (from the past 24 hour(s))  Procalcitonin - Baseline     Status: None   Collection Time: 10/25/22 12:54 PM  Result Value Ref Range   Procalcitonin <0.10 ng/mL  Basic metabolic panel     Status: Abnormal   Collection Time: 10/26/22  8:03 AM  Result Value Ref Range   Sodium 130 (L) 135 - 145 mmol/L   Potassium 2.9 (L) 3.5 - 5.1 mmol/L   Chloride 88 (L) 98 - 111 mmol/L   CO2 31 22 - 32 mmol/L   Glucose, Bld 105 (H) 70 - 99 mg/dL   BUN 20 8 - 23 mg/dL   Creatinine, Ser 1.38 (H) 0.44 - 1.00 mg/dL   Calcium 8.5 (L) 8.9 - 10.3 mg/dL   GFR, Estimated 36 (L) >60 mL/min   Anion gap 11 5 - 15  Protime-INR     Status: Abnormal   Collection Time: 10/26/22  8:03 AM  Result Value Ref Range   Prothrombin Time 26.3 (H) 11.4 - 15.2 seconds   INR 2.5 (H) 0.8 - 1.2  CBC with Differential/Platelet     Status: Abnormal   Collection Time: 10/26/22  8:03 AM  Result Value Ref Range   WBC 3.6 (L) 4.0 - 10.5 K/uL   RBC 3.79 (L) 3.87 - 5.11 MIL/uL   Hemoglobin 10.1 (L) 12.0 - 15.0 g/dL   HCT 33.3 (L) 36.0 - 46.0 %   MCV 87.9 80.0 - 100.0 fL   MCH 26.6 26.0 - 34.0 pg   MCHC 30.3 30.0 - 36.0 g/dL   RDW 15.9 (H) 11.5 - 15.5 %   Platelets 145 (L) 150 - 400 K/uL   nRBC 0.0 0.0 - 0.2 %   Neutrophils Relative % 63 %   Neutro Abs 2.3 1.7 - 7.7 K/uL   Lymphocytes Relative 19 %   Lymphs Abs 0.7 0.7 - 4.0 K/uL   Monocytes Relative 14 %   Monocytes Absolute 0.5 0.1 - 1.0 K/uL   Eosinophils Relative 3 %   Eosinophils Absolute 0.1 0.0 - 0.5 K/uL   Basophils Relative 1 %   Basophils Absolute 0.0 0.0 - 0.1 K/uL   Immature Granulocytes 0 %   Abs Immature Granulocytes 0.01 0.00 - 0.07 K/uL  Magnesium     Status: None    Collection Time: 10/26/22  8:03 AM  Result Value Ref Range   Magnesium 1.8 1.7 - 2.4 mg/dL  Procalcitonin     Status: None  Collection Time: 10/26/22  8:03 AM  Result Value Ref Range   Procalcitonin <0.10 ng/mL  C-reactive protein     Status: Abnormal   Collection Time: 10/26/22  8:03 AM  Result Value Ref Range   CRP 1.1 (H) <1.0 mg/dL  D-dimer, quantitative     Status: Abnormal   Collection Time: 10/26/22  8:03 AM  Result Value Ref Range   D-Dimer, Quant 0.60 (H) 0.00 - 0.50 ug/mL-FEU  Lactate dehydrogenase     Status: None   Collection Time: 10/26/22  8:03 AM  Result Value Ref Range   LDH 136 98 - 192 U/L    I have reviewed pertinent nursing notes, vitals, labs, and images as necessary. I have ordered labwork to follow up on as indicated.  I have reviewed the last notes from staff over past 24 hours. I have discussed patient's care plan and test results with nursing staff, CM/SW, and other staff as appropriate.  Time spent: Greater than 50% of the 55 minute visit was spent in counseling/coordination of care for the patient as laid out in the A&P.   LOS: 2 days   Dwyane Dee, MD [facility] 10/26/2022, 12:12 PM

## 2022-10-26 NOTE — Assessment & Plan Note (Signed)
-   per echo read this appears new. Per report: "Apical doppler suggests severe AS though signal could be contaminated " -Appears to have low mobility at baseline but given new findings, will consult cardiology for further input in case of need for medication adjustment - poor TAVR candidate but cardiology will continue to follow patient at discharge as well

## 2022-10-26 NOTE — Consult Note (Addendum)
Cardiology Consultation   Patient ID: [name] MRN: [id number]; DOB: 08-06-1928  Admit date: 10/23/2022 Date of Consult: 10/26/2022  PCP:  [name], Megan Providers Cardiologist:  Buford Dresser, MD        Patient Profile:   [name] is a 89 y.o. female with a hx of permanent atrial fibrillation on coumadin, hypertension, pulmonary hypertension, chronic diastolic heart failure who is being seen 10/26/2022 for the evaluation of worsening AS at the request of Dr. Sabino [name].  History of Present Illness:   [name] was brought to the ED on 2/15 by EMS for reported shortness of breath and worsening lower extremity edema. She lives at home with her daughter who had recently become ill with COVID-19. Patient is followed by Dr. Harrell Gave, and was last seen in office in December of 2023. At that visit, appears that patient was doing well. Per notes, for HFpEF management she was continued on Bumex, spironolactone. HTN was to be managed with Losartan and metoprolol. On admission, it is unclear whether patient is taking Losartan, Metoprolol, or Spironolactone, reportedly due to low BP.  Initial workup at [facility] noted patient to be positive for COVID-19. CXR with pulmonary edema vs COVID pneumonia. BNP 373.1. Patient hypertensive with initial BP 143/76.    On exam today, patient is only able to provide very limited/short term HPI. She does not recall significant shortness of breath at home prior to admission. She says that she was told her legs were getting more swollen but is unable to say that she personally noticed a change. She has limited mobility at baseline, reports using a walker for very limited ambulation around the house. Her primarily complaint today is left heel pain. Denies chest pain, palpitations, dyspnea at this time.   Attempted to contact patient's daughter today for additional HPI but was unable to reach her.  Past Medical History:   Diagnosis Date   Anticoagulant long-term use    Arrhythmia    Arthritis    Atrial fibrillation (HCC)    Congestive heart failure (CHF) (HCC)    Dysphagia    Esophageal reflux    Essential hypertension    Hiatal hernia    Pulmonary embolus ([place])    Thyroid disease     Past Surgical History:  Procedure Laterality Date   AMPUTATION TOE Right 11/17/2021   Procedure: RIGHT SECOND TOE AMPUTATION;  Surgeon: Wylene Simmer, MD;  Location: [facility];  Service: Orthopedics;  Laterality: Right;   COLONOSCOPY     ELBOW SURGERY     LAPAROSCOPIC HYSTERECTOMY       Home Medications:  Prior to Admission medications   Medication Sig Start Date End Date Taking? Authorizing Provider  acetaminophen (TYLENOL) 325 MG tablet Take 650 mg by mouth daily as needed for moderate pain.   Yes [provider]  Apoaequorin (PREVAGEN PO) Take 1 capsule by mouth daily.   Yes [provider]  bumetanide (BUMEX) 1 MG tablet Take 1 mg by mouth 2 (two) times daily. 2 am 1 pm 6 hours apart, not after 6 pm 12/17/21  Yes [provider]  carboxymethylcellulose (REFRESH PLUS) 0.5 % SOLN Place 1 drop into both eyes 2 (two) times daily.   Yes [provider]  cholecalciferol (VITAMIN D3) 25 MCG (1000 UT) tablet Take 1,000 Units by mouth daily.   Yes [provider]  clotrimazole-betamethasone (LOTRISONE) cream Apply 1 application. topically 2 (two) times daily as needed (rash/irritation). 11/09/21  Yes [provider]  gabapentin (NEURONTIN) 300 MG capsule Take 300 mg by mouth 2 (two) times daily.    Yes [provider]  levothyroxine (SYNTHROID) 100 MCG tablet Take 100 mcg by mouth every morning. 09/05/21  Yes [provider]  Multiple Vitamins-Minerals (PRESERVISION/LUTEIN) CAPS Take 1 capsule by mouth 2 (two) times daily.    Yes [provider]  pantoprazole (PROTONIX) 40 MG tablet Take 40 mg by mouth daily.   Yes [provider]  vitamin  B-12 (CYANOCOBALAMIN) 1000 MCG tablet Take 1,000 mcg by mouth daily.   Yes [provider]  warfarin (COUMADIN) 1 MG tablet Take 1-2 mg by mouth See admin instructions. Take 74m by mouth on Tues, Thur, Saturday & Sunday, Then take  2 mg on Monday, Wednesday, Friday.   Yes [provider]  metoprolol succinate (TOPROL-XL) 50 MG 24 hr tablet TAKE 1 TABLET BY MOUTH EVERY DAY WITH OR IMMEDIATELY FOLLOWING A MEAL Patient not taking: Reported on 10/24/2022 02/04/22   CBuford Dresser MD    Inpatient Medications: Scheduled Meds:  bumetanide  2 mg Oral BID   cholecalciferol  1,000 Units Oral Daily   cyanocobalamin  1,000 mcg Oral Daily   dexamethasone  6 mg Oral Q24H   gabapentin  300 mg Oral BID   levothyroxine  100 mcg Oral Q0600   mupirocin cream   Topical Daily   pantoprazole  40 mg Oral Daily   polyvinyl alcohol  1 drop Both Eyes BID   sodium chloride flush  3 mL Intravenous Q12H   warfarin  2 mg Oral ONCE-1600   Warfarin - Pharmacist Dosing Inpatient   Does not apply q1600   Continuous Infusions:  sodium chloride     remdesivir 100 mg in sodium chloride 0.9 % 100 mL IVPB 100 mg (10/25/22 1100)   PRN Meds: sodium chloride, acetaminophen, ondansetron (ZOFRAN) IV, sodium chloride flush  Allergies:    Allergies  Allergen Reactions   Amoxicillin Other (See Comments)    Unknown reaction    Azithromycin Other (See Comments)    Unknown reaction    Codeine Other (See Comments)    Unknown reaction    Erythromycin Other (See Comments)    Unknown reaction    Green Dyes Other (See Comments)    Allergic to ALL dyes   Iodine Other (See Comments)    Unknown reaction    Misc. Sulfonamide Containing Compounds    Oxycodone Other (See Comments)    Unknown reaction      Oxycodone-Acetaminophen Other (See Comments)    Unknown reaction    Penicillins Other (See Comments)    Unknown reaction    Sulfasalazine    Tramadol Hives    Social History:   Social History    Socioeconomic History   Marital status: Widowed    Spouse name: Not on file   Number of children: Not on file   Years of education: Not on file   Highest education level: Not on file  Occupational History   Not on file  Tobacco Use   Smoking status: Former    Packs/day: 1.00    Types: Cigarettes    Quit date: 10/02/2001    Years since quitting: 21.0   Smokeless tobacco: Never   Tobacco comments:    former smoker  Vaping Use   Vaping Use: Never used  Substance and Sexual Activity   Alcohol use: Not on file   Drug use: Not on file   Sexual activity: Not  on file  Other Topics Concern   Not on file  Social History Narrative   Not on file   Social Determinants of Health   Financial Resource Strain: Not on file  Food Insecurity: Not on file  Transportation Needs: Not on file  Physical Activity: Not on file  Stress: Not on file  Social Connections: Not on file  Intimate Partner Violence: Not on file    Family History:    Family History  Problem Relation Age of Onset   Stroke Mother    Cancer Brother      ROS:  Please see the history of present illness.   All other ROS reviewed and negative.     Physical Exam/Data:   Vitals:   10/25/22 1718 10/25/22 2014 10/26/22 0433 10/26/22 0857  BP: 139/73 (!) 146/69 138/61 121/60  Pulse: 81 87 82 77  Resp: 20 20 20 20  $ Temp:  97.7 F (36.5 C) 98 F (36.7 C) 98 F (36.7 C)  TempSrc:  Oral Oral Oral  SpO2: 97% 97% 97% 98%  Weight:      Height:        Intake/Output Summary (Last 24 hours) at 10/26/2022 0907 Last data filed at 10/26/2022 0600 Gross per 24 hour  Intake 600 ml  Output 1175 ml  Net -575 ml      10/23/2022    9:32 PM 08/28/2022    3:40 PM 05/21/2022    3:39 PM  Last 3 Weights  Weight (lbs) 182 lb 183 lb 182 lb 3.2 oz  Weight (kg) 82.555 kg 83.008 kg 82.645 kg     Body mass index is 30.29 kg/m.  General:  Frail and uncomfortable appearing HEENT: normal Neck: JVP to mandible Vascular: No  carotid bruits; Distal pulses 2+ bilaterally Cardiac:  normal S1, S2; RRR; no obvious murmur noted Lungs:  clear to auscultation bilaterally, no wheezing, rhonchi or rales  Abd: soft, nontender, no hepatomegaly  Ext: soft, non-pitting edema RLE. LLE with pitting edema to mid-calf, 2+. Multiple chronic wounds bilateral lower extremities. Musculoskeletal:  No deformities, BUE and BLE strength normal and equal Skin: warm and dry  Neuro:  CNs 2-12 intact, no focal abnormalities noted Psych:  Normal affect   EKG:  The EKG was personally reviewed and demonstrates:  atrial fibrillation, controlled rates Telemetry:  Telemetry was personally reviewed and demonstrates:  permanent atrial fibrillation, rates typically 70s-80s.  Relevant CV Studies:  10/25/22 TTE  IMPRESSIONS     1. Apical doppler suggests severe AS though signal could be contaminated;  suggest FU limited study with further doppler interrogation of AS.   2. Left ventricular ejection fraction, by estimation, is 60 to 65%. The  left ventricle has normal function. The left ventricle has no regional  wall motion abnormalities. There is mild concentric left ventricular  hypertrophy. Left ventricular diastolic  function could not be evaluated. Elevated left atrial pressure.   3. Right ventricular systolic function is moderately reduced. The right  ventricular size is moderately enlarged. There is severely elevated  pulmonary artery systolic pressure.   4. Left atrial size was severely dilated.   5. Right atrial size was severely dilated.   6. A small pericardial effusion is present.   7. The mitral valve is normal in structure. Mild mitral valve  regurgitation. No evidence of mitral stenosis.   8. Tricuspid valve regurgitation is severe.   9. The aortic valve is tricuspid. Aortic valve regurgitation is mild.  Severe aortic valve stenosis.  10.  The inferior vena cava is dilated in size with <50% respiratory  variability,  suggesting right atrial pressure of 15 mmHg.  11. Agitated saline contrast bubble study was negative, with no evidence  of any interatrial shunt.   FINDINGS   Left Ventricle: Left ventricular ejection fraction, by estimation, is 60  to 65%. The left ventricle has normal function. The left ventricle has no  regional wall motion abnormalities. The left ventricular internal cavity  size was normal in size. There is   mild concentric left ventricular hypertrophy. Left ventricular diastolic  function could not be evaluated due to atrial fibrillation. Left  ventricular diastolic function could not be evaluated. Elevated left  atrial pressure.   Right Ventricle: The right ventricular size is moderately enlarged. Right  ventricular systolic function is moderately reduced. There is severely  elevated pulmonary artery systolic pressure. The tricuspid regurgitant  velocity is 3.62 m/s, and with an  assumed right atrial pressure of 15 mmHg, the estimated right ventricular  systolic pressure is 0000000 mmHg.   Left Atrium: Left atrial size was severely dilated.   Right Atrium: Right atrial size was severely dilated.   Pericardium: A small pericardial effusion is present.   Mitral Valve: The mitral valve is normal in structure. Mild mitral annular  calcification. Mild mitral valve regurgitation. No evidence of mitral  valve stenosis.   Tricuspid Valve: The tricuspid valve is normal in structure. Tricuspid  valve regurgitation is severe. No evidence of tricuspid stenosis.   Aortic Valve: The aortic valve is tricuspid. Aortic valve regurgitation is  mild. Severe aortic stenosis is present. Aortic valve mean gradient  measures 39.0 mmHg. Aortic valve peak gradient measures 26.1 mmHg. Aortic  valve area, by VTI measures 1.10 cm.   Pulmonic Valve: The pulmonic valve was normal in structure. Pulmonic valve  regurgitation is trivial. No evidence of pulmonic stenosis.   Aorta: The aortic root is  normal in size and structure.   Venous: The inferior vena cava is dilated in size with less than 50%  respiratory variability, suggesting right atrial pressure of 15 mmHg.   IAS/Shunts: No atrial level shunt detected by color flow Doppler. Agitated  saline contrast was given intravenously to evaluate for intracardiac  shunting. Agitated saline contrast bubble study was negative, with no  evidence of any interatrial shunt.   Additional Comments: Apical doppler suggests severe AS though signal could  be contaminated; suggest FU limited study with further doppler  interrogation of AS.   Laboratory Data:  High Sensitivity Troponin:   Recent Labs  Lab 10/23/22 2152 10/23/22 2319  TROPONINIHS 29* 29*     Chemistry Recent Labs  Lab 10/23/22 2319 10/25/22 0247 10/26/22 0803  NA 135 131* 130*  K 3.3* 3.5 2.9*  CL 92* 90* 88*  CO2 27 29 31  $ GLUCOSE 86 174* 105*  BUN 16 18 20  $ CREATININE 1.47* 1.47* 1.38*  CALCIUM 8.8* 8.4* 8.5*  MG  --   --  1.8  GFRNONAA 33* 33* 36*  ANIONGAP 16* 12 11    Recent Labs  Lab 10/23/22 2152  PROT 7.3  ALBUMIN 3.4*  AST 39  ALT 12  ALKPHOS 141*  BILITOT 2.0*   Lipids No results for input(s): "CHOL", "TRIG", "HDL", "LABVLDL", "LDLCALC", "CHOLHDL" in the last 168 hours.  Hematology Recent Labs  Lab 10/23/22 2152 10/26/22 0803  WBC 4.2 3.6*  RBC 4.39 3.79*  HGB 12.1 10.1*  HCT 38.4 33.3*  MCV 87.5 87.9  MCH 27.6 26.6  MCHC 31.5 30.3  RDW 15.8* 15.9*  PLT 117* 145*   Thyroid No results for input(s): "TSH", "FREET4" in the last 168 hours.  BNP Recent Labs  Lab 10/23/22 2152  BNP 373.1*    DDimer  Recent Labs  Lab 10/25/22 0957 10/26/22 0803  DDIMER 0.66* 0.60*     Radiology/Studies:  ECHOCARDIOGRAM COMPLETE BUBBLE STUDY  Result Date: 10/25/2022    ECHOCARDIOGRAM REPORT   Patient Name:   ROSEMERY GOINES Breese Date of Exam: 10/25/2022 Medical Rec #:  [id number]     Height:       65.0 in Accession #:    [id number]    Weight:        182.0 lb Date of Birth:  1927/09/14      BSA:          1.900 m Patient Age:    76 years      BP:           140/104 mmHg Patient Gender: F             HR:           66 bpm. Exam Location:  Inpatient Procedure: 2D Echo, Cardiac Doppler, Color Doppler and Saline Contrast Bubble            Study Indications:    CHF  History:        Patient has prior history of Echocardiogram examinations, most                 recent 11/10/2017. CHF, Pulmonary HTN; Arrythmias:Atrial                 Fibrillation. CKD, stage 3.  Sonographer:    Ronny Flurry Referring Phys: [id number] AVA SWAYZE IMPRESSIONS  1. Apical doppler suggests severe AS though signal could be contaminated; suggest FU limited study with further doppler interrogation of AS.  2. Left ventricular ejection fraction, by estimation, is 60 to 65%. The left ventricle has normal function. The left ventricle has no regional wall motion abnormalities. There is mild concentric left ventricular hypertrophy. Left ventricular diastolic function could not be evaluated. Elevated left atrial pressure.  3. Right ventricular systolic function is moderately reduced. The right ventricular size is moderately enlarged. There is severely elevated pulmonary artery systolic pressure.  4. Left atrial size was severely dilated.  5. Right atrial size was severely dilated.  6. A small pericardial effusion is present.  7. The mitral valve is normal in structure. Mild mitral valve regurgitation. No evidence of mitral stenosis.  8. Tricuspid valve regurgitation is severe.  9. The aortic valve is tricuspid. Aortic valve regurgitation is mild. Severe aortic valve stenosis. 10. The inferior vena cava is dilated in size with <50% respiratory variability, suggesting right atrial pressure of 15 mmHg. 11. Agitated saline contrast bubble study was negative, with no evidence of any interatrial shunt. FINDINGS  Left Ventricle: Left ventricular ejection fraction, by estimation, is 60 to 65%. The left ventricle has  normal function. The left ventricle has no regional wall motion abnormalities. The left ventricular internal cavity size was normal in size. There is  mild concentric left ventricular hypertrophy. Left ventricular diastolic function could not be evaluated due to atrial fibrillation. Left ventricular diastolic function could not be evaluated. Elevated left atrial pressure. Right Ventricle: The right ventricular size is moderately enlarged. Right ventricular systolic function is moderately reduced. There is severely elevated pulmonary artery systolic pressure. The tricuspid regurgitant velocity is 3.62 m/s, and with an assumed  right atrial pressure of 15 mmHg, the estimated right ventricular systolic pressure is 0000000 mmHg. Left Atrium: Left atrial size was severely dilated. Right Atrium: Right atrial size was severely dilated. Pericardium: A small pericardial effusion is present. Mitral Valve: The mitral valve is normal in structure. Mild mitral annular calcification. Mild mitral valve regurgitation. No evidence of mitral valve stenosis. Tricuspid Valve: The tricuspid valve is normal in structure. Tricuspid valve regurgitation is severe. No evidence of tricuspid stenosis. Aortic Valve: The aortic valve is tricuspid. Aortic valve regurgitation is mild. Severe aortic stenosis is present. Aortic valve mean gradient measures 39.0 mmHg. Aortic valve peak gradient measures 26.1 mmHg. Aortic valve area, by VTI measures 1.10 cm. Pulmonic Valve: The pulmonic valve was normal in structure. Pulmonic valve regurgitation is trivial. No evidence of pulmonic stenosis. Aorta: The aortic root is normal in size and structure. Venous: The inferior vena cava is dilated in size with less than 50% respiratory variability, suggesting right atrial pressure of 15 mmHg. IAS/Shunts: No atrial level shunt detected by color flow Doppler. Agitated saline contrast was given intravenously to evaluate for intracardiac shunting. Agitated saline  contrast bubble study was negative, with no evidence of any interatrial shunt. Additional Comments: Apical doppler suggests severe AS though signal could be contaminated; suggest FU limited study with further doppler interrogation of AS.  LEFT VENTRICLE PLAX 2D LVIDd:         3.70 cm   Diastology LVIDs:         2.30 cm   LV e' medial:    7.83 cm/s LV PW:         1.70 cm   LV E/e' medial:  20.4 LV IVS:        1.30 cm   LV e' lateral:   10.70 cm/s LVOT diam:     1.80 cm   LV E/e' lateral: 14.9 LV SV:         72 LV SV Index:   38 LVOT Area:     2.54 cm  RIGHT VENTRICLE            IVC RV S prime:     9.57 cm/s  IVC diam: 3.70 cm TAPSE (M-mode): 2.1 cm LEFT ATRIUM              Index        RIGHT ATRIUM           Index LA diam:        5.90 cm  3.10 cm/m   RA Area:     41.20 cm LA Vol (A2C):   115.0 ml 60.51 ml/m  RA Volume:   169.00 ml 88.93 ml/m LA Vol (A4C):   67.6 ml  35.57 ml/m LA Biplane Vol: 89.3 ml  46.99 ml/m  AORTIC VALVE AV Area (Vmax):    1.48 cm AV Area (Vmean):   1.34 cm AV Area (VTI):     1.10 cm AV Vmax:           255.67 cm/s AV Vmean:          190.667 cm/s AV VTI:            0.655 m AV Peak Grad:      26.1 mmHg AV Mean Grad:      39.0 mmHg LVOT Vmax:         149.00 cm/s LVOT Vmean:        100.233 cm/s LVOT VTI:          0.284 m  LVOT/AV VTI ratio: 0.43  AORTA Ao Root diam: 3.60 cm Ao Asc diam:  3.60 cm MV E velocity: 159.50 cm/s  TRICUSPID VALVE                             TR Peak grad:   52.4 mmHg                             TR Vmax:        362.00 cm/s                              SHUNTS                             Systemic VTI:  0.28 m                             Systemic Diam: 1.80 cm Kirk Ruths MD Electronically signed by Kirk Ruths MD Signature Date/Time: 10/25/2022/4:57:36 PM    Final    DG CHEST PORT 1 VIEW  Result Date: 10/25/2022 CLINICAL DATA:  COVID-19 infection.  Shortness of breath. EXAM: PORTABLE CHEST 1 VIEW COMPARISON:  Single-view of the chest 10/23/2022 and PA and  lateral chest 10/23/2022. 0 FINDINGS: Marked cardiomegaly. There is interstitial pulmonary edema. Small bilateral pleural effusions and basilar airspace disease are worse on the left. Aortic atherosclerosis noted. No pneumothorax. IMPRESSION: No change in interstitial edema with small pleural effusions and basilar airspace disease, worse on the left. Marked cardiomegaly. Electronically Signed   By: Inge Rise M.D.   On: 10/25/2022 08:58   DG Chest Portable 1 View  Result Date: 10/23/2022 CLINICAL DATA:  Shortness of breath EXAM: PORTABLE CHEST 1 VIEW COMPARISON:  10/10/2022 FINDINGS: Diffuse cardiac enlargement. Pulmonary vascular congestion. Perihilar and basilar interstitial changes likely edema. Small bilateral pleural effusions. There is mild progression since prior study. No pneumothorax. Apical pleural calcifications, likely postinflammatory. Calcification of the aorta. Degenerative changes in the shoulders. IMPRESSION: Cardiac enlargement with pulmonary vascular congestion, interstitial edema, and small bilateral pleural effusions, progressing since prior study. Electronically Signed   By: Lucienne Capers M.D.   On: 10/23/2022 21:51     Assessment and Plan:   Acute on chronic HFpEF Severe Pulmonary hypertension Acute hypoxic respiratory failure with COVID-19  Patient admitted on 2/16 after being brought to the ED with worsening lower extremity edema and dyspnea. BNP noted to be elevated at 373.1. TTE with LVEF 60-65%, severely elevated pulmonary artery systolic pressure, RV systolic pressure estimated 67.37mHg.  In the setting of active COVID-19 infection, difficult to discern degree to which symptoms are a manifestation of worsening HFpEF and pulmonary hypertension. RV systolic pressure is consistent with historical echocardiograms and admission weight is stable with last clinic weight. Agree with increased diuresis for lower extremity edema and pulmonary hypertension. Home Bumex  increased to 235mBID. Continue to closely monitor daily I/O. Would restrict oral intake to <150014mer 24 hours.  K 2.9 today, Mg 1.8. Will replace both. Maintain K>4, Mg >2.0 Resume spironolactone today, 12.5mg12malf home dose). Per pharmacy, last fill for this was 09/30/22 though unclear if patient was taking.  Severe Aortic Stenosis  TTE this admission with doppler suggesting severe AS. AV mean gradient 39 mmHg, Vmax 255  cm/s.   Though images suggest severe AS, per Dr. Stanford Breed who interpreted the study, signal could be contaminated. Would consider repeating a limited echo to more closely examine AS once patient has recovered from acute illness.  From discussions with patient, she appears to have limited mobility at baseline. Unclear whether she has symptoms of AS/exertional dyspnea.   Permanent atrial fibrillation  Patient with permanent atrial fibrillation, on Coumadin and Metoprolol for rate control though there is question as to patient being off Metoprolol recently due to low BP.   Continue Coumadin per pharmacy. Currently not receiving Metoprolol. Rates still well controlled. Unless BP is low, would consider resuming Metoprolol, perhaps at lower dose.   Hypertension  Patient's BP currently at goal without Metoprolol or Losartan. Would not plan to resume Losartan at this time. As above, Metoprolol may need to be resumed for rate control.   Chronic lower extremity wounds  Management per primary team, wound care team.   Risk Assessment/Risk Scores:        New York Heart Association (NYHA) Functional Class NYHA Class II (functional limitation makes assessment difficult)  CHA2DS2-VASc Score = 5   This indicates a 7.2% annual risk of stroke. The patient's score is based upon: CHF History: 1 HTN History: 1 Diabetes History: 0 Stroke History: 0 Vascular Disease History: 0 Age Score: 2 Gender Score: 1         For questions or updates, please contact [place] Please consult [email or web address] for contact info under    Signed, Lily Kocher, PA-C  10/26/2022 9:07 AM  ATTENDING NOTE  I saw and examined the patient independently. The patient isn't able to tell me why she is here. Her only complaints are that she has been cold and her mouth is dry. She denies shortness of breath, chest pain, cough, fever.  PE: Appears comfortable. 1+ edema. Breathing easily. I did not appreciate a murmur.  I agree with the assessment and plan in the above note. We do not have many options for managing her AS acutely. She appears to be tolerating it reasonably well from a hemodynamic standpoint despite COVID. I agree with diuresis for pulmonary hypertension. Follow strict I/O's and weights. BP and heart rates are well controlled.

## 2022-10-26 NOTE — Progress Notes (Signed)
[place] for warfarin Indication: atrial fibrillation  Allergies  Allergen Reactions   Amoxicillin Other (See Comments)    Unknown reaction    Azithromycin Other (See Comments)    Unknown reaction    Codeine Other (See Comments)    Unknown reaction    Erythromycin Other (See Comments)    Unknown reaction    Green Dyes Other (See Comments)    Allergic to ALL dyes   Iodine Other (See Comments)    Unknown reaction    Misc. Sulfonamide Containing Compounds    Oxycodone Other (See Comments)    Unknown reaction      Oxycodone-Acetaminophen Other (See Comments)    Unknown reaction    Penicillins Other (See Comments)    Unknown reaction    Sulfasalazine    Tramadol Hives    Patient Measurements: Height: 5' 5"$  (165.1 cm) Weight: 82.6 kg (182 lb) IBW/kg (Calculated) : 57  Vital Signs: Temp: 98 F (36.7 C) (02/18 0857) Temp Source: Oral (02/18 0857) BP: 121/60 (02/18 0857) Pulse Rate: 77 (02/18 0857)  Labs: Recent Labs    10/23/22 2152 10/23/22 2319 10/25/22 0247 10/26/22 0803  HGB 12.1  --   --  10.1*  HCT 38.4  --   --  33.3*  PLT 117*  --   --  145*  LABPROT 26.2*  --  35.5* 26.3*  INR 2.4*  --  3.6* 2.5*  CREATININE 1.57* 1.47* 1.47* 1.38*  TROPONINIHS 29* 29*  --   --      Estimated Creatinine Clearance: 27.6 mL/min (A) (by C-G formula based on SCr of 1.38 mg/dL (H)).  Assessment: 89yo female admitted for hypoxia in the setting of COVID and anasarca, to continue Coumadin for Afib  Dose PTA - 2 mg MWF, 1 mg TThSS  INR down from 3.6 to 2.5  Goal of Therapy:  INR 2-3   Plan:  Warfarin 2 mg po x 1 today  Daily INR  Thank you Anette Guarneri, PharmD  10/26/2022,9:00 AM

## 2022-10-27 DIAGNOSIS — I350 Nonrheumatic aortic (valve) stenosis: Secondary | ICD-10-CM | POA: Diagnosis not present

## 2022-10-27 DIAGNOSIS — I5033 Acute on chronic diastolic (congestive) heart failure: Secondary | ICD-10-CM | POA: Diagnosis not present

## 2022-10-27 DIAGNOSIS — R0902 Hypoxemia: Secondary | ICD-10-CM | POA: Diagnosis not present

## 2022-10-27 DIAGNOSIS — U071 COVID-19: Secondary | ICD-10-CM | POA: Diagnosis not present

## 2022-10-27 LAB — CBC WITH DIFFERENTIAL/PLATELET
Abs Immature Granulocytes: 0.04 10*3/uL (ref 0.00–0.07)
Basophils Absolute: 0 10*3/uL (ref 0.0–0.1)
Basophils Relative: 1 %
Eosinophils Absolute: 0 10*3/uL (ref 0.0–0.5)
Eosinophils Relative: 1 %
HCT: 33.8 % — ABNORMAL LOW (ref 36.0–46.0)
Hemoglobin: 10.6 g/dL — ABNORMAL LOW (ref 12.0–15.0)
Immature Granulocytes: 1 %
Lymphocytes Relative: 17 %
Lymphs Abs: 0.6 10*3/uL — ABNORMAL LOW (ref 0.7–4.0)
MCH: 27.1 pg (ref 26.0–34.0)
MCHC: 31.4 g/dL (ref 30.0–36.0)
MCV: 86.4 fL (ref 80.0–100.0)
Monocytes Absolute: 0.2 10*3/uL (ref 0.1–1.0)
Monocytes Relative: 6 %
Neutro Abs: 2.6 10*3/uL (ref 1.7–7.7)
Neutrophils Relative %: 74 %
Platelets: 88 10*3/uL — ABNORMAL LOW (ref 150–400)
RBC: 3.91 MIL/uL (ref 3.87–5.11)
RDW: 15.8 % — ABNORMAL HIGH (ref 11.5–15.5)
WBC: 3.5 10*3/uL — ABNORMAL LOW (ref 4.0–10.5)
nRBC: 0 % (ref 0.0–0.2)

## 2022-10-27 LAB — C-REACTIVE PROTEIN: CRP: 1 mg/dL — ABNORMAL HIGH (ref <1.0)

## 2022-10-27 LAB — BASIC METABOLIC PANEL
Anion gap: 10 (ref 5–15)
BUN: 20 mg/dL (ref 8–23)
CO2: 31 mmol/L (ref 22–32)
Calcium: 8.3 mg/dL — ABNORMAL LOW (ref 8.9–10.3)
Chloride: 89 mmol/L — ABNORMAL LOW (ref 98–111)
Creatinine, Ser: 1.38 mg/dL — ABNORMAL HIGH (ref 0.44–1.00)
GFR, Estimated: 36 mL/min — ABNORMAL LOW (ref >60)
Glucose, Bld: 83 mg/dL (ref 70–99)
Potassium: 4.5 mmol/L (ref 3.5–5.1)
Sodium: 130 mmol/L — ABNORMAL LOW (ref 135–145)

## 2022-10-27 LAB — D-DIMER, QUANTITATIVE: D-Dimer, Quant: 1.1 ug/mL-FEU — ABNORMAL HIGH (ref 0.00–0.50)

## 2022-10-27 LAB — MAGNESIUM: Magnesium: 2.1 mg/dL (ref 1.7–2.4)

## 2022-10-27 LAB — LACTATE DEHYDROGENASE: LDH: 168 U/L (ref 98–192)

## 2022-10-27 LAB — PROCALCITONIN: Procalcitonin: <0.1 ng/mL

## 2022-10-27 LAB — PROTIME-INR
INR: 2 — ABNORMAL HIGH (ref 0.8–1.2)
Prothrombin Time: 22.3 seconds — ABNORMAL HIGH (ref 11.4–15.2)

## 2022-10-27 MED ORDER — WARFARIN SODIUM 2 MG PO TABS
2.0000 mg | ORAL_TABLET | Freq: Once | ORAL | Status: AC
  Administered 2022-10-27: 2 mg via ORAL
  Filled 2022-10-27: qty 1

## 2022-10-27 NOTE — Progress Notes (Signed)
ANTICOAGULATION CONSULT NOTE - Follow Up Consult  Pharmacy Consult for Warfarin Indication: atrial fibrillation and hx PE  Patient Measurements: Height: 5' 5"$  (165.1 cm) Weight: 82.6 kg (182 lb) IBW/kg (Calculated) : 57  Vital Signs: Temp: 98 F (36.7 C) (02/19 0916) Temp Source: Oral (02/19 0916) BP: 118/68 (02/19 0916) Pulse Rate: 88 (02/19 0916)  Labs: Recent Labs    10/25/22 0247 10/26/22 0803 10/27/22 0209  HGB  --  10.1* 10.6*  HCT  --  33.3* 33.8*  PLT  --  145* 88*  LABPROT 35.5* 26.3* 22.3*  INR 3.6* 2.5* 2.0*  CREATININE 1.47* 1.38* 1.38*    Estimated Creatinine Clearance: 27.6 mL/min (A) (by C-G formula based on SCr of 1.38 mg/dL (H)).  Assessment: 89 yr old female admitted for hypoxia in the setting of COVID and anasarca. Pharmacy consulted for continuation of PTA Warfarin for atrial fibrillation and hx PE.    PTA warfarin regimen: 2 mg MWF, 1 mg TTSS  INR therapeutic (2.4) on admit, but up to 3.6 on 2/17 and warfarin dose held.  INR 2.5 on 2/18 and warfarin resumed with 2 mg. INR trended down to 2.0 today.  Platelet count trended down to 88.  No bleeding reported per RN.  Remdesivir completed 2/18.  On Dexamethasone 6 mg PO daily.   Goal of Therapy:  INR 2-3 Monitor platelets by anticoagulation protocol: Yes   Plan:  Warfarin 2 mg x 1 today, usual Monday dose. Daily PT/INR and CBC. Monitor for signs/symptoms of bleeding.  Arty Baumgartner, [place] 10/27/2022,1:03 PM

## 2022-10-27 NOTE — Progress Notes (Signed)
Progress Note    [name]   [id number]  DOB: 1928-05-01  DOA: 10/23/2022     3 PCP: Saintclair Halsted, FNP  Initial CC: SOB, swelling  Hospital Course: [name] is a 89 yr old female with PMH A-fib, CHF, dysphagia, HTN, GERD, history of PE, hypothyroidism who was brought to the hospital with worsening shortness of breath and edema.  Patient resides at home with her daughter who was noted to have COVID-19.  On testing after admission, patient was found to be positive for COVID and started on treatment.  Interval History:  No events overnight.  Breathing appears comfortable this morning.  She has no concerns in general.  Lower extremity swelling persists but shows some mild improvement.  Assessment and Plan: * COVID-19 - Testing positive for COVID-19 on admission with new hypoxia -Completed remdesivir x 3 days.  Continue course of Decadron - Continue trending inflammatory markers - Continue COVID precautions - PCT remains negative  Hypoxia - Due to COVID-19 / pulmonary edema as discussed above. -Not on oxygen at home, wean as able  Acute on chronic diastolic CHF (congestive heart failure) (HCC) - Unclear how much of CXR findings represent CHF vs COVID PNA - BNP elevated; pulmonary edema, small pleural effusions; DOE; pulmonary crackles, B/L leg edema - continue bumex -Echo updated: Severe AS new.  EF 60 to 65%, no RWMA, mild LVH.  RV moderately enlarged and elevated PA pressures which are previously known.  Severely dilated LA and RA.  Small pericardial effusion  Severe aortic stenosis - per echo read this appears new. Per report: "Apical doppler suggests severe AS though signal could be contaminated " -Appears to have low mobility at baseline but given new findings, will consult cardiology for further input in case of need for medication adjustment - poor TAVR candidate but cardiology will continue to follow patient at discharge as well  Chronic kidney disease, stage 3b  ([place]) - patient has history of CKD3b. Baseline creat ~ 1.4, eGFR 33   Personal history of pulmonary embolism Looks like she takes coumadin chronically for this and for permanent a.fib - continue coumadin per pharmacy  Permanent atrial fibrillation ([place]) Cont coumadin BB stopped recently due to hypotension.  Chronic thromboembolic pulmonary hypertension (HCC) [place] due to prior PEs   Old records reviewed in assessment of this patient  Antimicrobials: Remdesivir 2/16 >> 10/26/2022  DVT prophylaxis:  warfarin (COUMADIN) tablet 2 mg   Code Status:   Code Status: DNR  Mobility Assessment (last 72 hours)     Mobility Assessment     Row Name 10/27/22 0800 10/26/22 0857 10/24/22 1343       Does patient have an order for bedrest or is patient medically unstable No - Continue assessment No - Continue assessment No - Continue assessment     What is the highest level of mobility based on the progressive mobility assessment? Level 1 (Bedfast) - Unable to balance while sitting on edge of bed Level 1 (Bedfast) - Unable to balance while sitting on edge of bed Level 1 (Bedfast) - Unable to balance while sitting on edge of bed     Is the above level different from baseline mobility prior to current illness? Yes - Recommend PT order Yes - Recommend PT order Yes - Recommend PT order              Barriers to discharge:  Disposition Plan:  Home pending PT/OT evals Status is: Inpt  Objective: Blood pressure 118/68,  pulse 88, temperature 98 F (36.7 C), temperature source Oral, resp. rate 18, height 5' 5"$  (1.651 m), weight 82.6 kg, SpO2 98 %.  Examination:  Physical Exam Constitutional:      Appearance: She is well-developed.  HENT:     Head: Normocephalic and atraumatic.     Mouth/Throat:     Mouth: Mucous membranes are moist.  Eyes:     Extraocular Movements: Extraocular movements intact.  Cardiovascular:     Rate and Rhythm: Normal rate.  Pulmonary:     Effort: Pulmonary effort  is normal. No respiratory distress.     Breath sounds: Rhonchi present.  Abdominal:     General: Bowel sounds are normal. There is no distension.     Palpations: Abdomen is soft.     Tenderness: There is no abdominal tenderness.  Musculoskeletal:        General: Swelling (2+ B/L LE edema) present. Normal range of motion.     Cervical back: Normal range of motion and neck supple.  Skin:    General: Skin is warm and dry.  Neurological:     Mental Status: She is alert. Mental status is at baseline.  Psychiatric:        Mood and Affect: Mood normal.      Consultants:  Cardiology  Procedures:    Data Reviewed: Results for orders placed or performed during the hospital encounter of 10/23/22 (from the past 24 hour(s))  Basic metabolic panel     Status: Abnormal   Collection Time: 10/27/22  2:09 AM  Result Value Ref Range   Sodium 130 (L) 135 - 145 mmol/L   Potassium 4.5 3.5 - 5.1 mmol/L   Chloride 89 (L) 98 - 111 mmol/L   CO2 31 22 - 32 mmol/L   Glucose, Bld 83 70 - 99 mg/dL   BUN 20 8 - 23 mg/dL   Creatinine, Ser 1.38 (H) 0.44 - 1.00 mg/dL   Calcium 8.3 (L) 8.9 - 10.3 mg/dL   GFR, Estimated 36 (L) >60 mL/min   Anion gap 10 5 - 15  Protime-INR     Status: Abnormal   Collection Time: 10/27/22  2:09 AM  Result Value Ref Range   Prothrombin Time 22.3 (H) 11.4 - 15.2 seconds   INR 2.0 (H) 0.8 - 1.2  CBC with Differential/Platelet     Status: Abnormal   Collection Time: 10/27/22  2:09 AM  Result Value Ref Range   WBC 3.5 (L) 4.0 - 10.5 K/uL   RBC 3.91 3.87 - 5.11 MIL/uL   Hemoglobin 10.6 (L) 12.0 - 15.0 g/dL   HCT 33.8 (L) 36.0 - 46.0 %   MCV 86.4 80.0 - 100.0 fL   MCH 27.1 26.0 - 34.0 pg   MCHC 31.4 30.0 - 36.0 g/dL   RDW 15.8 (H) 11.5 - 15.5 %   Platelets 88 (L) 150 - 400 K/uL   nRBC 0.0 0.0 - 0.2 %   Neutrophils Relative % 74 %   Neutro Abs 2.6 1.7 - 7.7 K/uL   Lymphocytes Relative 17 %   Lymphs Abs 0.6 (L) 0.7 - 4.0 K/uL   Monocytes Relative 6 %   Monocytes  Absolute 0.2 0.1 - 1.0 K/uL   Eosinophils Relative 1 %   Eosinophils Absolute 0.0 0.0 - 0.5 K/uL   Basophils Relative 1 %   Basophils Absolute 0.0 0.0 - 0.1 K/uL   Immature Granulocytes 1 %   Abs Immature Granulocytes 0.04 0.00 - 0.07 K/uL  Magnesium  Status: None   Collection Time: 10/27/22  2:09 AM  Result Value Ref Range   Magnesium 2.1 1.7 - 2.4 mg/dL  Procalcitonin     Status: None   Collection Time: 10/27/22  2:09 AM  Result Value Ref Range   Procalcitonin <0.10 ng/mL  C-reactive protein     Status: Abnormal   Collection Time: 10/27/22  2:09 AM  Result Value Ref Range   CRP 1.0 (H) <1.0 mg/dL  D-dimer, quantitative     Status: Abnormal   Collection Time: 10/27/22  2:09 AM  Result Value Ref Range   D-Dimer, Quant 1.10 (H) 0.00 - 0.50 ug/mL-FEU  Lactate dehydrogenase     Status: None   Collection Time: 10/27/22  2:09 AM  Result Value Ref Range   LDH 168 98 - 192 U/L    I have reviewed pertinent nursing notes, vitals, labs, and images as necessary. I have ordered labwork to follow up on as indicated.  I have reviewed the last notes from staff over past 24 hours. I have discussed patient's care plan and test results with nursing staff, CM/SW, and other staff as appropriate.  Time spent: Greater than 50% of the 55 minute visit was spent in counseling/coordination of care for the patient as laid out in the A&P.   LOS: 3 days   Dwyane Dee, MD [facility] 10/27/2022, 1:11 PM

## 2022-10-27 NOTE — Progress Notes (Signed)
Progress Note  Patient Name: [name] Date of Encounter: 10/27/2022  Primary Cardiologist:   Buford Dresser, MD   Subjective   No chest pain.  She thinks her breathing is OK.    Inpatient Medications    Scheduled Meds:  bumetanide  2 mg Oral BID   cholecalciferol  1,000 Units Oral Daily   cyanocobalamin  1,000 mcg Oral Daily   dexamethasone  6 mg Oral Q24H   gabapentin  300 mg Oral BID   levothyroxine  100 mcg Oral Q0600   mupirocin cream   Topical Daily   pantoprazole  40 mg Oral Daily   polyvinyl alcohol  1 drop Both Eyes BID   sodium chloride flush  3 mL Intravenous Q12H   spironolactone  12.5 mg Oral Daily   Warfarin - Pharmacist Dosing Inpatient   Does not apply q1600   Continuous Infusions:  sodium chloride     PRN Meds: sodium chloride, acetaminophen, ondansetron (ZOFRAN) IV, sodium chloride flush   Vital Signs    Vitals:   10/26/22 0857 10/26/22 2051 10/27/22 0612 10/27/22 0916  BP: 121/60 130/64 119/65 118/68  Pulse: 77 82 84 88  Resp: 20 16 18 18  $ Temp: 98 F (36.7 C) 98.2 F (36.8 C) 97.8 F (36.6 C) 98 F (36.7 C)  TempSrc: Oral Oral Oral Oral  SpO2: 98% 98% 97% 98%  Weight:      Height:        Intake/Output Summary (Last 24 hours) at 10/27/2022 1155 Last data filed at 10/27/2022 0955 Gross per 24 hour  Intake 360 ml  Output 1975 ml  Net -1615 ml   Filed Weights   10/23/22 2132  Weight: 82.6 kg    Telemetry    Atrial fib with controlled rate - Personally Reviewed  ECG    NA - Personally Reviewed  Physical Exam   GEN: No acute distress.   Neck: No  JVD Cardiac: Irregular RR, 3/6 apical systolic murmur, no diastolic murmurs, rubs, or gallops.  Respiratory:     Decreased breath sounds basilar coarse crackles.  GI: Soft, nontender, non-distended  MS:   Mild non pitting  edema; No deformity. Neuro:  Nonfocal  Psych: Normal affect, somewhat confused  Labs    Chemistry Recent Labs  Lab 10/23/22 2152  10/23/22 2319 10/25/22 0247 10/26/22 0803 10/27/22 0209  NA 132*   < > 131* 130* 130*  K 3.7   < > 3.5 2.9* 4.5  CL 89*   < > 90* 88* 89*  CO2 28   < > 29 31 31  $ GLUCOSE 88   < > 174* 105* 83  BUN 16   < > 18 20 20  $ CREATININE 1.57*   < > 1.47* 1.38* 1.38*  CALCIUM 9.2   < > 8.4* 8.5* 8.3*  PROT 7.3  --   --   --   --   ALBUMIN 3.4*  --   --   --   --   AST 39  --   --   --   --   ALT 12  --   --   --   --   ALKPHOS 141*  --   --   --   --   BILITOT 2.0*  --   --   --   --   GFRNONAA 31*   < > 33* 36* 36*  ANIONGAP 15   < > 12 11 10   $ < > =  values in this interval not displayed.     Hematology Recent Labs  Lab 10/23/22 2152 10/26/22 0803 10/27/22 0209  WBC 4.2 3.6* 3.5*  RBC 4.39 3.79* 3.91  HGB 12.1 10.1* 10.6*  HCT 38.4 33.3* 33.8*  MCV 87.5 87.9 86.4  MCH 27.6 26.6 27.1  MCHC 31.5 30.3 31.4  RDW 15.8* 15.9* 15.8*  PLT 117* 145* 88*    Cardiac EnzymesNo results for input(s): "TROPONINI" in the last 168 hours. No results for input(s): "TROPIPOC" in the last 168 hours.   BNP Recent Labs  Lab 10/23/22 2152  BNP 373.1*     DDimer  Recent Labs  Lab 10/25/22 0957 10/26/22 0803 10/27/22 0209  DDIMER 0.66* 0.60* 1.10*     Radiology    ECHOCARDIOGRAM COMPLETE BUBBLE STUDY  Result Date: 10/25/2022    ECHOCARDIOGRAM REPORT   Patient Name:   [name] [place] Date of Exam: 10/25/2022 Medical Rec #:  [id number]     Height:       65.0 in Accession #:    [id number]    Weight:       182.0 lb Date of Birth:  06-May-1928      BSA:          1.900 m Patient Age:    89 years      BP:           140/104 mmHg Patient Gender: F             HR:           66 bpm. Exam Location:  Inpatient Procedure: 2D Echo, Cardiac Doppler, Color Doppler and Saline Contrast Bubble            Study Indications:    CHF  History:        Patient has prior history of Echocardiogram examinations, most                 recent 11/10/2017. CHF, Pulmonary HTN; Arrythmias:Atrial                 Fibrillation. CKD,  stage 3.  Sonographer:    Ronny Flurry Referring Phys: [id number] AVA SWAYZE IMPRESSIONS  1. Apical doppler suggests severe AS though signal could be contaminated; suggest FU limited study with further doppler interrogation of AS.  2. Left ventricular ejection fraction, by estimation, is 60 to 65%. The left ventricle has normal function. The left ventricle has no regional wall motion abnormalities. There is mild concentric left ventricular hypertrophy. Left ventricular diastolic function could not be evaluated. Elevated left atrial pressure.  3. Right ventricular systolic function is moderately reduced. The right ventricular size is moderately enlarged. There is severely elevated pulmonary artery systolic pressure.  4. Left atrial size was severely dilated.  5. Right atrial size was severely dilated.  6. A small pericardial effusion is present.  7. The mitral valve is normal in structure. Mild mitral valve regurgitation. No evidence of mitral stenosis.  8. Tricuspid valve regurgitation is severe.  9. The aortic valve is tricuspid. Aortic valve regurgitation is mild. Severe aortic valve stenosis. 10. The inferior vena cava is dilated in size with <50% respiratory variability, suggesting right atrial pressure of 15 mmHg. 11. Agitated saline contrast bubble study was negative, with no evidence of any interatrial shunt. FINDINGS  Left Ventricle: Left ventricular ejection fraction, by estimation, is 60 to 65%. The left ventricle has normal function. The left ventricle has no regional wall motion abnormalities. The left ventricular internal cavity size  was normal in size. There is  mild concentric left ventricular hypertrophy. Left ventricular diastolic function could not be evaluated due to atrial fibrillation. Left ventricular diastolic function could not be evaluated. Elevated left atrial pressure. Right Ventricle: The right ventricular size is moderately enlarged. Right ventricular systolic function is moderately  reduced. There is severely elevated pulmonary artery systolic pressure. The tricuspid regurgitant velocity is 3.62 m/s, and with an assumed right atrial pressure of 15 mmHg, the estimated right ventricular systolic pressure is 0000000 mmHg. Left Atrium: Left atrial size was severely dilated. Right Atrium: Right atrial size was severely dilated. Pericardium: A small pericardial effusion is present. Mitral Valve: The mitral valve is normal in structure. Mild mitral annular calcification. Mild mitral valve regurgitation. No evidence of mitral valve stenosis. Tricuspid Valve: The tricuspid valve is normal in structure. Tricuspid valve regurgitation is severe. No evidence of tricuspid stenosis. Aortic Valve: The aortic valve is tricuspid. Aortic valve regurgitation is mild. Severe aortic stenosis is present. Aortic valve mean gradient measures 39.0 mmHg. Aortic valve peak gradient measures 26.1 mmHg. Aortic valve area, by VTI measures 1.10 cm. Pulmonic Valve: The pulmonic valve was normal in structure. Pulmonic valve regurgitation is trivial. No evidence of pulmonic stenosis. Aorta: The aortic root is normal in size and structure. Venous: The inferior vena cava is dilated in size with less than 50% respiratory variability, suggesting right atrial pressure of 15 mmHg. IAS/Shunts: No atrial level shunt detected by color flow Doppler. Agitated saline contrast was given intravenously to evaluate for intracardiac shunting. Agitated saline contrast bubble study was negative, with no evidence of any interatrial shunt. Additional Comments: Apical doppler suggests severe AS though signal could be contaminated; suggest FU limited study with further doppler interrogation of AS.  LEFT VENTRICLE PLAX 2D LVIDd:         3.70 cm   Diastology LVIDs:         2.30 cm   LV e' medial:    7.83 cm/s LV PW:         1.70 cm   LV E/e' medial:  20.4 LV IVS:        1.30 cm   LV e' lateral:   10.70 cm/s LVOT diam:     1.80 cm   LV E/e' lateral: 14.9  LV SV:         72 LV SV Index:   38 LVOT Area:     2.54 cm  RIGHT VENTRICLE            IVC RV S prime:     9.57 cm/s  IVC diam: 3.70 cm TAPSE (M-mode): 2.1 cm LEFT ATRIUM              Index        RIGHT ATRIUM           Index LA diam:        5.90 cm  3.10 cm/m   RA Area:     41.20 cm LA Vol (A2C):   115.0 ml 60.51 ml/m  RA Volume:   169.00 ml 88.93 ml/m LA Vol (A4C):   67.6 ml  35.57 ml/m LA Biplane Vol: 89.3 ml  46.99 ml/m  AORTIC VALVE AV Area (Vmax):    1.48 cm AV Area (Vmean):   1.34 cm AV Area (VTI):     1.10 cm AV Vmax:           255.67 cm/s AV Vmean:          190.667  cm/s AV VTI:            0.655 m AV Peak Grad:      26.1 mmHg AV Mean Grad:      39.0 mmHg LVOT Vmax:         149.00 cm/s LVOT Vmean:        100.233 cm/s LVOT VTI:          0.284 m LVOT/AV VTI ratio: 0.43  AORTA Ao Root diam: 3.60 cm Ao Asc diam:  3.60 cm MV E velocity: 159.50 cm/s  TRICUSPID VALVE                             TR Peak grad:   52.4 mmHg                             TR Vmax:        362.00 cm/s                              SHUNTS                             Systemic VTI:  0.28 m                             Systemic Diam: 1.80 cm Kirk Ruths MD Electronically signed by Kirk Ruths MD Signature Date/Time: 10/25/2022/4:57:36 PM    Final     Cardiac Studies   Echo  10/25/22   1. Apical doppler suggests severe AS though signal could be contaminated;  suggest FU limited study with further doppler interrogation of AS.   2. Left ventricular ejection fraction, by estimation, is 60 to 65%. The  left ventricle has normal function. The left ventricle has no regional  wall motion abnormalities. There is mild concentric left ventricular  hypertrophy. Left ventricular diastolic  function could not be evaluated. Elevated left atrial pressure.   3. Right ventricular systolic function is moderately reduced. The right  ventricular size is moderately enlarged. There is severely elevated  pulmonary artery systolic pressure.    4. Left atrial size was severely dilated.   5. Right atrial size was severely dilated.   6. A small pericardial effusion is present.   7. The mitral valve is normal in structure. Mild mitral valve  regurgitation. No evidence of mitral stenosis.   8. Tricuspid valve regurgitation is severe.   9. The aortic valve is tricuspid. Aortic valve regurgitation is mild.  Severe aortic valve stenosis.  10. The inferior vena cava is dilated in size with <50% respiratory  variability, suggesting right atrial pressure of 15 mmHg.  11. Agitated saline contrast bubble study was negative, with no evidence  of any interatrial shunt.   Patient Profile     89 y.o. female  with a hx of permanent atrial fibrillation on coumadin, hypertension, pulmonary hypertension, chronic diastolic heart failure who is being seen 10/26/2022 for the evaluation of worsening AS at the request of Dr. Sabino Gasser.   Assessment & Plan    Acute on chronic HFpEF:   Intake and output are incomplete.  Difficult to assess contribution of volume to her respiratory complaints with acute COVID.   Reasonable to continue current dose of Bumex   Severe AS:  Plan is an out patient repeat limited echo once she is recovered from her acute illness.  She likely would be a poor candidate for TAVR.      Atrial fib:  Permanent.   On warfarin and rate seems to be well controlled.   HTN:  BP and HR are OK.  No change in meds.   For questions or updates, please contact [place] Please consult [email or web address] for contact info under Cardiology/STEMI.   Signed, Minus Breeding, MD  10/27/2022, 11:55 AM

## 2022-10-28 DIAGNOSIS — I5033 Acute on chronic diastolic (congestive) heart failure: Secondary | ICD-10-CM | POA: Diagnosis not present

## 2022-10-28 DIAGNOSIS — R0902 Hypoxemia: Secondary | ICD-10-CM | POA: Diagnosis not present

## 2022-10-28 DIAGNOSIS — U071 COVID-19: Secondary | ICD-10-CM | POA: Diagnosis not present

## 2022-10-28 DIAGNOSIS — I350 Nonrheumatic aortic (valve) stenosis: Secondary | ICD-10-CM | POA: Diagnosis not present

## 2022-10-28 LAB — BASIC METABOLIC PANEL
Anion gap: 11 (ref 5–15)
BUN: 19 mg/dL (ref 8–23)
CO2: 33 mmol/L — ABNORMAL HIGH (ref 22–32)
Calcium: 8.6 mg/dL — ABNORMAL LOW (ref 8.9–10.3)
Chloride: 87 mmol/L — ABNORMAL LOW (ref 98–111)
Creatinine, Ser: 1.39 mg/dL — ABNORMAL HIGH (ref 0.44–1.00)
GFR, Estimated: 36 mL/min — ABNORMAL LOW (ref >60)
Glucose, Bld: 118 mg/dL — ABNORMAL HIGH (ref 70–99)
Potassium: 4.4 mmol/L (ref 3.5–5.1)
Sodium: 131 mmol/L — ABNORMAL LOW (ref 135–145)

## 2022-10-28 LAB — CBC WITH DIFFERENTIAL/PLATELET
Abs Immature Granulocytes: 0.02 10*3/uL (ref 0.00–0.07)
Basophils Absolute: 0 10*3/uL (ref 0.0–0.1)
Basophils Relative: 0 %
Eosinophils Absolute: 0 10*3/uL (ref 0.0–0.5)
Eosinophils Relative: 0 %
HCT: 33.5 % — ABNORMAL LOW (ref 36.0–46.0)
Hemoglobin: 10.4 g/dL — ABNORMAL LOW (ref 12.0–15.0)
Immature Granulocytes: 1 %
Lymphocytes Relative: 17 %
Lymphs Abs: 0.5 10*3/uL — ABNORMAL LOW (ref 0.7–4.0)
MCH: 26.9 pg (ref 26.0–34.0)
MCHC: 31 g/dL (ref 30.0–36.0)
MCV: 86.6 fL (ref 80.0–100.0)
Monocytes Absolute: 0.2 10*3/uL (ref 0.1–1.0)
Monocytes Relative: 5 %
Neutro Abs: 2.3 10*3/uL (ref 1.7–7.7)
Neutrophils Relative %: 77 %
Platelets: 86 10*3/uL — ABNORMAL LOW (ref 150–400)
RBC: 3.87 MIL/uL (ref 3.87–5.11)
RDW: 15.7 % — ABNORMAL HIGH (ref 11.5–15.5)
WBC: 3 10*3/uL — ABNORMAL LOW (ref 4.0–10.5)
nRBC: 0 % (ref 0.0–0.2)

## 2022-10-28 LAB — C-REACTIVE PROTEIN: CRP: 1 mg/dL — ABNORMAL HIGH (ref <1.0)

## 2022-10-28 LAB — MAGNESIUM: Magnesium: 1.9 mg/dL (ref 1.7–2.4)

## 2022-10-28 LAB — PROTIME-INR
INR: 2 — ABNORMAL HIGH (ref 0.8–1.2)
Prothrombin Time: 22.1 seconds — ABNORMAL HIGH (ref 11.4–15.2)

## 2022-10-28 LAB — LACTATE DEHYDROGENASE: LDH: 145 U/L (ref 98–192)

## 2022-10-28 LAB — D-DIMER, QUANTITATIVE: D-Dimer, Quant: 0.95 ug/mL-FEU — ABNORMAL HIGH (ref 0.00–0.50)

## 2022-10-28 MED ORDER — SENNOSIDES-DOCUSATE SODIUM 8.6-50 MG PO TABS
1.0000 | ORAL_TABLET | Freq: Two times a day (BID) | ORAL | Status: DC
  Administered 2022-10-28 – 2022-11-07 (×20): 1 via ORAL
  Filled 2022-10-28 (×20): qty 1

## 2022-10-28 MED ORDER — POLYETHYLENE GLYCOL 3350 17 G PO PACK
17.0000 g | PACK | Freq: Every day | ORAL | Status: DC
  Administered 2022-10-28 – 2022-11-06 (×10): 17 g via ORAL
  Filled 2022-10-28 (×11): qty 1

## 2022-10-28 MED ORDER — WARFARIN SODIUM 2 MG PO TABS
2.0000 mg | ORAL_TABLET | Freq: Once | ORAL | Status: AC
  Administered 2022-10-28: 2 mg via ORAL
  Filled 2022-10-28: qty 1

## 2022-10-28 NOTE — Evaluation (Signed)
Occupational Therapy Evaluation Patient Details Name: [name] MRN: [id number] DOB: [birth year] Today's Date: 10/28/2022   History of Present Illness Pt is a 89 y/o female admitted for hypoxia in setting of COVID-19 infection and acute on chronic CHF. PMH: a fib, cHF, PE, R toe amputation, HTN, hiatal hernia, thyroid disease.   Clinical Impression   PTA, pt lives with daughter, typically Modified Independent with ADLs/mobility using a RW. Pt presents now with deficits in cognition, strength, and cardiopulmonary endurance. Evaluation limited due to confusion and self limiting behaviors. However, pt able to demo good movement in extremities and easily come to long sitting in bed. Despite reports of bathroom assist needs, pt declined OT assist for [facility] transfers. Anticipate once pt back in own home environment, she will quickly return to PLOF with family assist. However, will postpone finalizing DC recs until OOB ADLs assessed.       Recommendations for follow up therapy are one component of a multi-disciplinary discharge planning process, led by the attending physician.  Recommendations may be updated based on patient status, additional functional criteria and insurance authorization.   Follow Up Recommendations  Other (comment) (TBD pending OOB ADL assessment; Home if family able to provide 24/7 physical assist for ADLs/mobility; SNF rehab if family unable to provide this assistance)     Assistance Recommended at Discharge Frequent or constant Supervision/Assistance  Patient can return home with the following A lot of help with walking and/or transfers;A lot of help with bathing/dressing/bathroom    Functional Status Assessment  Patient has had a recent decline in their functional status and demonstrates the ability to make significant improvements in function in a reasonable and predictable amount of time.  Equipment Recommendations  None recommended by OT    Recommendations for Other  Services       Precautions / Restrictions Precautions Precautions: Fall;Other (comment) Precaution Comments: monitor O2 (does not wear at baseline) Restrictions Weight Bearing Restrictions: No      Mobility Bed Mobility Overal bed mobility: Needs Assistance             General bed mobility comments: declined to sit EOB but able to demo long sitting with B bedrails without assistance    Transfers                   General transfer comment: pt declined to attempt due to preference to sleep      Balance                                           ADL either performed or assessed with clinical judgement   ADL Overall ADL's : Needs assistance/impaired Eating/Feeding: Set up;Bed level   Grooming: Set up;Bed level;Sitting;Wash/dry face   Upper Body Bathing: Minimal assistance;Sitting   Lower Body Bathing: Moderate assistance;Sitting/lateral leans   Upper Body Dressing : Minimal assistance;Sitting;Bed level   Lower Body Dressing: Sitting/lateral leans;Maximal assistance       Toileting- Clothing Manipulation and Hygiene: Maximal assistance;Bed level Toileting - Clothing Manipulation Details (indicate cue type and reason): reported need to use bathroom but unsure about purewick use. offered BSC though pt also declined this; anticipate likely able to transfer to [facility] though due to pt confusion, unable to attempt       General ADL Comments: Limited by baseline cognitive deficits causing self limiting behaviors and difficult to encourage participation. Pt able  to demo BLE/UE movement, long sitting in bed w/o assist though declined OOB attempts due to preference to sleep     Vision Baseline Vision/History: 1 Wears glasses Ability to See in Adequate Light: 1 Impaired Patient Visual Report: No change from baseline Vision Assessment?: No apparent visual deficits     Perception     Praxis      Pertinent Vitals/Pain Pain Assessment Pain  Assessment: Faces Faces Pain Scale: Hurts little more Pain Location: back of R LE Pain Descriptors / Indicators: Sore Pain Intervention(s): Monitored during session     Hand Dominance Right   Extremity/Trunk Assessment Upper Extremity Assessment Upper Extremity Assessment: Generalized weakness   Lower Extremity Assessment Lower Extremity Assessment: Defer to PT evaluation   Cervical / Trunk Assessment Cervical / Trunk Assessment: Kyphotic   Communication Communication Communication: No difficulties   Cognition Arousal/Alertness: Awake/alert Behavior During Therapy: WFL for tasks assessed/performed, Flat affect Overall Cognitive Status: Impaired/Different from baseline Area of Impairment: Orientation, Attention, Memory, Following commands, Safety/judgement, Awareness, Problem solving                 Orientation Level: Disoriented to, Time, Situation, Place Current Attention Level: Sustained Memory: Decreased short-term memory Following Commands: Follows one step commands with increased time Safety/Judgement: Decreased awareness of safety, Decreased awareness of deficits Awareness: Intellectual Problem Solving: Slow processing, Requires verbal cues, Requires tactile cues General Comments: Pt reports unaware of being in hospital until a prior staff member told her. Pt reports never being in the hospital before (saw a prior admission in 2023 at least in chart), that she did not have covid because she had not been anywhere and denied family having covid (per chart, daughter with covid). Poor problem solving and awareness, declined oob attempts d/t preference to sleep but also reports did not trust purewick; difficult to redirect and educate for meaningful participation this AM.     General Comments       Exercises     Shoulder Instructions      Home Living Family/patient expects to be discharged to:: Private residence Living Arrangements: Children Available Help at  Discharge: Family;Available 24 hours/day Type of Home: House       Home Layout: One level     Bathroom Shower/Tub: Sponge bathes at baseline   Bathroom Toilet: Standard     Home Equipment: Conservation officer, nature (2 wheels);BSC/3in1          Prior Functioning/Environment Prior Level of Function : Independent/Modified Independent;Patient poor historian/Family not available             Mobility Comments: per chart, pt uses RW for mobility ADLs Comments: per prior admission info, pt Mod I for ADLs, sponge bathes at baseline. pt poor historian so unable to provide accurate info        OT Problem List: Decreased strength;Decreased activity tolerance;Decreased cognition;Cardiopulmonary status limiting activity      OT Treatment/Interventions: Self-care/ADL training;Therapeutic exercise;Energy conservation;DME and/or AE instruction;Therapeutic activities    OT Goals(Current goals can be found in the care plan section) Acute Rehab OT Goals Patient Stated Goal: sleep, get rid of this O2 OT Goal Formulation: Patient unable to participate in goal setting Time For Goal Achievement: 11/11/22 Potential to Achieve Goals: Fair ADL Goals Pt Will Perform Lower Body Bathing: with min assist;sit to/from stand Pt Will Perform Lower Body Dressing: with min assist;sit to/from stand Pt Will Transfer to Toilet: with min assist;stand pivot transfer;bedside commode  OT Frequency: Min 2X/week    Co-evaluation  AM-PAC OT "6 Clicks" Daily Activity     Outcome Measure Help from another person eating meals?: A Little Help from another person taking care of personal grooming?: A Little Help from another person toileting, which includes using toliet, bedpan, or urinal?: A Lot Help from another person bathing (including washing, rinsing, drying)?: A Lot Help from another person to put on and taking off regular upper body clothing?: A Little Help from another person to put on and taking  off regular lower body clothing?: A Lot 6 Click Score: 15   End of Session Equipment Utilized During Treatment: Oxygen Nurse Communication: Mobility status  Activity Tolerance: Other (comment) (limited by cognition) Patient left: in bed;with call bell/phone within reach;with bed alarm set  OT Visit Diagnosis: Muscle weakness (generalized) (M62.81);Other symptoms and signs involving cognitive function                Time: [id number] OT Time Calculation (min): 18 min Charges:  OT General Charges $OT Visit: 1 Visit OT Evaluation $OT Eval Moderate Complexity: 1 Mod  Malachy Chamber, OTR/L Acute Rehab Services Office: [phone]   Layla Maw 10/28/2022, 8:56 AM

## 2022-10-28 NOTE — Progress Notes (Signed)
ANTICOAGULATION CONSULT NOTE - Follow Up Consult  Pharmacy Consult for Warfarin Indication: atrial fibrillation and hx PE  Patient Measurements: Height: 5' 5"$  (165.1 cm) Weight: 79.8 kg (175 lb 14.8 oz) IBW/kg (Calculated) : 57  Vital Signs: Temp: 97.8 F (36.6 C) (02/20 1105) Temp Source: Oral (02/20 1105) BP: 112/53 (02/20 1105) Pulse Rate: 77 (02/20 1105)  Labs: Recent Labs    10/26/22 0803 10/27/22 0209 10/28/22 0244  HGB 10.1* 10.6* 10.4*  HCT 33.3* 33.8* 33.5*  PLT 145* 88* 86*  LABPROT 26.3* 22.3* 22.1*  INR 2.5* 2.0* 2.0*  CREATININE 1.38* 1.38* 1.39*     Estimated Creatinine Clearance: 26.9 mL/min (A) (by C-G formula based on SCr of 1.39 mg/dL (H)).  Assessment: 89 yr old female admitted for hypoxia in the setting of COVID and anasarca. Pharmacy consulted for continuation of PTA Warfarin for atrial fibrillation and hx PE.    PTA warfarin regimen: 2 mg MWF, 1 mg TTSS  INR therapeutic (2.4) on admit, but up to 3.6 on 2/17 and warfarin dose held.  INR 2.5 on 2/18 and warfarin resumed with 2 mg. INR trended down to 2.0 on 2/19 and 2 mg repeated. INR 2.0 again today. Platelet count trended down to 88>86.  No bleeding reported per RN.  Remdesivir completed 2/18.  On Dexamethasone 6 mg PO daily.   Goal of Therapy:  INR 2-3 Monitor platelets by anticoagulation protocol: Yes   Plan:  Warfarin 2 mg x 1 again today, to try to keep INR at goal. Daily PT/INR and CBC. Monitor for signs/symptoms of bleeding.  Arty Baumgartner,  10/28/2022,1:03 PM

## 2022-10-28 NOTE — Progress Notes (Signed)
Progress Note  Patient Name: [name] Date of Encounter: 10/28/2022  Primary Cardiologist:   Buford Dresser, MD   Subjective   Denies chest pain or SOB.    Inpatient Medications    Scheduled Meds:  bumetanide  2 mg Oral BID   cholecalciferol  1,000 Units Oral Daily   cyanocobalamin  1,000 mcg Oral Daily   dexamethasone  6 mg Oral Q24H   gabapentin  300 mg Oral BID   levothyroxine  100 mcg Oral Q0600   mupirocin cream   Topical Daily   pantoprazole  40 mg Oral Daily   polyvinyl alcohol  1 drop Both Eyes BID   sodium chloride flush  3 mL Intravenous Q12H   spironolactone  12.5 mg Oral Daily   Warfarin - Pharmacist Dosing Inpatient   Does not apply q1600   Continuous Infusions:  sodium chloride     PRN Meds: sodium chloride, acetaminophen, ondansetron (ZOFRAN) IV, sodium chloride flush   Vital Signs    Vitals:   10/27/22 1700 10/27/22 2206 10/28/22 0435 10/28/22 0755  BP: 122/61 132/64 (!) 145/64 135/65  Pulse:  84 80   Resp: (!) 21 20 18 18  $ Temp: 97.7 F (36.5 C) 98 F (36.7 C) (!) 97.5 F (36.4 C) 98.1 F (36.7 C)  TempSrc: Oral Oral  Axillary  SpO2: 97% 99% 94% 96%  Weight:      Height:        Intake/Output Summary (Last 24 hours) at 10/28/2022 1041 Last data filed at 10/28/2022 0022 Gross per 24 hour  Intake 240 ml  Output 0 ml  Net 240 ml   Filed Weights   10/23/22 2132  Weight: 82.6 kg    Telemetry    Atrial fib with rate controlled.  - Personally Reviewed  ECG    NA   Physical Exam   GEN: No acute distress.   Neck: No  JVD Cardiac: Irregular RR, 2/6 apical systolic murmur, no diastolic murmurs, rubs, or gallops.  Respiratory:     Decreased breath sounds GI: Soft, nontender, non-distended  MS:   No piitting  edema; No deformity. Neuro:  Nonfocal  Psych:    Confused but pleasant  Labs    Chemistry Recent Labs  Lab 10/23/22 2152 10/23/22 2319 10/26/22 0803 10/27/22 0209 10/28/22 0244  NA 132*   < > 130* 130*  131*  K 3.7   < > 2.9* 4.5 4.4  CL 89*   < > 88* 89* 87*  CO2 28   < > 31 31 33*  GLUCOSE 88   < > 105* 83 118*  BUN 16   < > 20 20 19  $ CREATININE 1.57*   < > 1.38* 1.38* 1.39*  CALCIUM 9.2   < > 8.5* 8.3* 8.6*  PROT 7.3  --   --   --   --   ALBUMIN 3.4*  --   --   --   --   AST 39  --   --   --   --   ALT 12  --   --   --   --   ALKPHOS 141*  --   --   --   --   BILITOT 2.0*  --   --   --   --   GFRNONAA 31*   < > 36* 36* 36*  ANIONGAP 15   < > 11 10 11   $ < > = values in this interval not  displayed.     Hematology Recent Labs  Lab 10/26/22 0803 10/27/22 0209 10/28/22 0244  WBC 3.6* 3.5* 3.0*  RBC 3.79* 3.91 3.87  HGB 10.1* 10.6* 10.4*  HCT 33.3* 33.8* 33.5*  MCV 87.9 86.4 86.6  MCH 26.6 27.1 26.9  MCHC 30.3 31.4 31.0  RDW 15.9* 15.8* 15.7*  PLT 145* 88* 86*    Cardiac EnzymesNo results for input(s): "TROPONINI" in the last 168 hours. No results for input(s): "TROPIPOC" in the last 168 hours.   BNP Recent Labs  Lab 10/23/22 2152  BNP 373.1*     DDimer  Recent Labs  Lab 10/26/22 0803 10/27/22 0209 10/28/22 0244  DDIMER 0.60* 1.10* 0.95*     Radiology    No results found.  Cardiac Studies   Echo  10/25/22   1. Apical doppler suggests severe AS though signal could be contaminated;  suggest FU limited study with further doppler interrogation of AS.   2. Left ventricular ejection fraction, by estimation, is 60 to 65%. The  left ventricle has normal function. The left ventricle has no regional  wall motion abnormalities. There is mild concentric left ventricular  hypertrophy. Left ventricular diastolic  function could not be evaluated. Elevated left atrial pressure.   3. Right ventricular systolic function is moderately reduced. The right  ventricular size is moderately enlarged. There is severely elevated  pulmonary artery systolic pressure.   4. Left atrial size was severely dilated.   5. Right atrial size was severely dilated.   6. A small  pericardial effusion is present.   7. The mitral valve is normal in structure. Mild mitral valve  regurgitation. No evidence of mitral stenosis.   8. Tricuspid valve regurgitation is severe.   9. The aortic valve is tricuspid. Aortic valve regurgitation is mild.  Severe aortic valve stenosis.  10. The inferior vena cava is dilated in size with <50% respiratory  variability, suggesting right atrial pressure of 15 mmHg.  11. Agitated saline contrast bubble study was negative, with no evidence  of any interatrial shunt.   Patient Profile     89 y.o. female  with a hx of permanent atrial fibrillation on coumadin, hypertension, pulmonary hypertension, chronic diastolic heart failure who is being seen 10/26/2022 for the evaluation of worsening AS at the request of Dr. Sabino Gasser.   Assessment & Plan    Acute on chronic HFpEF:   Intake and output are incomplete.  Tolerating current dose of Bumex.  No change in therapy.    Severe AS:  Plan is an out patient repeat limited echo once she is recovered from her acute illness.  She likely would be a poor candidate for TAVR.    No further in patient imagining.   Atrial fib:  Permanent.   On warfarin and rate seems to be well controlled.   HTN:  BP and HR are OK.  No change in meds.   Please call with further questions.   For questions or updates, please contact [place] Please consult [email or web address] for contact info under Cardiology/STEMI.   Signed, Minus Breeding, MD  10/28/2022, 10:41 AM

## 2022-10-28 NOTE — TOC Initial Note (Signed)
Transition of Care [facility]) - Initial/Assessment Note    Patient Details  Name: [name] MRN: [id number] Date of Birth: 28-Sep-1927  Transition of [facility]) CM/SW Contact:    Tom-Johnson, Renea Ee, RN Phone Number: 10/28/2022, 2:34 PM  Clinical Narrative:                  CM spoke with patient's daughter, [name] via phone about needs for post hospital transition. Patient is admitted for Covid-19 Virus and on Air bourne precaution.  OT recommends Home if family able to provide 24/7 physical assist for ADLs/mobility; SNF Rehab if family unable to provide this assistance.  CM relay recommendation to [facility] and she states she will not make any decision at this time until MD talks to her about patient's bilateral toes with wound, WOC following and results of INR. CM notified MD.  [name] states she also has Covid and not feeling well and could hardly get out of bed. CM informed [name] that another call will be made when appropriate to discuss disposition.  CM will continue to follow as patient progresses with care towards discharge.              Barriers to Discharge: Continued Medical Work up   Patient Goals and CMS Choice Patient states their goals for this hospitalization and ongoing recovery are:: To return home CMS Medicare.gov Compare Post Acute Care list provided to:: Patient Choice offered to / list presented to : Patient, Adult Children (Daughter, [name])      Expected Discharge Plan and Services   Discharge Planning Services: CM Consult   Living arrangements for the past 2 months: Single Family Home                                      Prior Living Arrangements/Services Living arrangements for the past 2 months: Single Family Home Lives with:: Adult Children (Daughter, [name]) Patient language and need for interpreter reviewed:: Yes Do you feel safe going back to the place where you live?: Yes      Need for Family Participation in Patient  Care: Yes (Comment) Care giver support system in place?: Yes (comment)   Criminal Activity/Legal Involvement Pertinent to Current Situation/Hospitalization: No - Comment as needed  Activities of Daily Living Home Assistive Devices/Equipment: Walker (specify type) ADL Screening (condition at time of admission) Patient's cognitive ability adequate to safely complete daily activities?: Yes Is the patient deaf or have difficulty hearing?: No Does the patient have difficulty seeing, even when wearing glasses/contacts?: No Does the patient have difficulty concentrating, remembering, or making decisions?: Yes Patient able to express need for assistance with ADLs?: Yes Does the patient have difficulty dressing or bathing?: Yes Independently performs ADLs?: No Communication: Independent Dressing (OT): Needs assistance Is this a change from baseline?: Change from baseline, expected to last >3 days Grooming: Needs assistance Is this a change from baseline?: Change from baseline, expected to last >3 days Feeding: Independent Bathing: Needs assistance Is this a change from baseline?: Change from baseline, expected to last >3 days Toileting: Needs assistance Is this a change from baseline?: Change from baseline, expected to last >3days In/Out Bed: Dependent Is this a change from baseline?: Change from baseline, expected to last >3 days Walks in Home: Needs assistance Is this a change from baseline?: Change from baseline, expected to last >3 days Does the patient have difficulty walking or climbing stairs?: Yes  Weakness of Legs: Both Weakness of Arms/Hands: Both  Permission Sought/Granted Permission sought to share information with : Case Manager, Family Supports Permission granted to share information with : Yes, Verbal Permission Granted              Emotional Assessment Appearance:: Appears stated age Attitude/Demeanor/Rapport: Engaged, Gracious Affect (typically observed): Accepting,  Appropriate, Calm, Hopeful, Pleasant Orientation: : Oriented to Self Alcohol / Substance Use: Not Applicable Psych Involvement: No (comment)  Admission diagnosis:  Anasarca [R60.1] Peripheral edema [R60.9] Acute respiratory failure with hypoxia ([place]) [J96.01] Pneumonia due to COVID-19 virus [U07.1, J12.82] COVID-19 [U07.1] Patient Active Problem List   Diagnosis Date Noted   Severe aortic stenosis 10/26/2022   Pneumonia due to COVID-19 virus 10/24/2022   COVID-19 10/23/2022   Hypoxia 10/23/2022   Positive blood culture 11/20/2021   Generalized weakness 11/20/2021   Thrombocytopenia ([place]) 11/17/2021   Anemia of chronic disease 11/17/2021   AKI on CKD-3B 11/17/2021   Osteomyelitis ([place]) 11/16/2021   Acute on chronic diastolic CHF (congestive heart failure) ([place]) 11/16/2021   Hyponatremia 11/16/2021   Chronic kidney disease, stage 3b ([place]) 11/16/2021   Leukocytosis 11/16/2021   Uncontrolled hypertension 11/15/2021   Hyperthyroidism 11/15/2021   Peripheral venous insufficiency 11/15/2021   Personal history of pulmonary embolism 11/15/2021   Osteomyelitis of second toe of right foot ([place]) 11/15/2021   Cellulitis 11/15/2021   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   Permanent atrial fibrillation ([place]) 06/20/2009   Chronic obstructive lung disease ([place]) 04/04/2009   Gastroesophageal reflux disease 12/25/2008   Generalized anxiety disorder 12/25/2008   Hypercholesterolemia 12/25/2008   Hypothyroidism 12/25/2008   PCP:  Saintclair Halsted, FNP Pharmacy:   [id number] [place] [id number]: [phone]: [phone]    Social Determinants of Health (SDOH) Social History: SDOH Screenings   Food Insecurity: Food Insecurity Present (10/27/2022)  Housing: Low Risk  (10/27/2022)  Transportation Needs: No Transportation Needs (10/27/2022)  Utilities: Not At Risk (10/27/2022)   Tobacco Use: Medium Risk (10/23/2022)   SDOH Interventions:     Readmission Risk Interventions    11/18/2021   12:55 PM  Readmission Risk Prevention Plan  Transportation Screening Complete  PCP or Specialist Appt within 3-5 Days Complete  HRI or [place]  Social Work Consult for [place]anning/Counseling Complete  Palliative Care Screening Complete  Medication Review (Press photographer Complete

## 2022-10-28 NOTE — Evaluation (Signed)
Physical Therapy Evaluation  Patient Details Name: [name] MRN: [id number] DOB: Feb 17, 1928 Today's Date: 10/28/2022  History of Present Illness  Pt is a 89 y/o female admitted for hypoxia in setting of COVID-19 infection and acute on chronic CHF. PMH: a fib, cHF, PE, R toe amputation, HTN, hiatal hernia, thyroid disease.   Clinical Impression  Pt admitted with above diagnosis. Pt currently with functional limitations due to the deficits listed below (see PT Problem List). At the time of PT eval pt was able to perform transfers only with up to mod assist and RW for support. Pt requiring increased multimodal cues and assist for safe transfer. Pt oriented x1 and unable to provide meaningful history. Information taken from chart review. Anticipate pt will require SNF level rehab prior to return home, however if daughter is comfortable providing current level of assist, we can shift to home health follow up. CM updated. Acutely, pt will benefit from skilled PT to increase their independence and safety with mobility to allow discharge to the venue listed below.          Recommendations for follow up therapy are one component of a multi-disciplinary discharge planning process, led by the attending physician.  Recommendations may be updated based on patient status, additional functional criteria and insurance authorization.  Follow Up Recommendations Skilled nursing-short term rehab (<3 hours/day) Can patient physically be transported by private vehicle: Yes    Assistance Recommended at Discharge Frequent or constant Supervision/Assistance  Patient can return home with the following  A lot of help with walking and/or transfers;A lot of help with bathing/dressing/bathroom;Assistance with cooking/housework;Assistance with feeding;Help with stairs or ramp for entrance;Assist for transportation;Direct supervision/assist for medications management;Direct supervision/assist for financial management     Equipment Recommendations None recommended by PT  Recommendations for Other Services       Functional Status Assessment Patient has had a recent decline in their functional status and demonstrates the ability to make significant improvements in function in a reasonable and predictable amount of time.     Precautions / Restrictions Precautions Precautions: Fall Restrictions Weight Bearing Restrictions: No      Mobility  Bed Mobility Overal bed mobility: Needs Assistance Bed Mobility: Supine to Sit     Supine to sit: Mod assist     General bed mobility comments: Assist to elevate trunk and scoot out fully to EOB. Pt able to initiate Le's towards EOB.    Transfers Overall transfer level: Needs assistance Equipment used: Rolling walker (2 wheels) Transfers: Sit to/from Stand, Bed to chair/wheelchair/BSC Sit to Stand: Mod assist   Step pivot transfers: Min guard       General transfer comment: Assist required for power up to full stand. VC's for hand placement on seated surface for safety but pt wanting to pull to stand from center bar of walker instead.    Ambulation/Gait               General Gait Details: Deferred  Stairs            Wheelchair Mobility    Modified Rankin (Stroke Patients Only)       Balance Overall balance assessment: Needs assistance Sitting-balance support: Feet supported, No upper extremity supported Sitting balance-Leahy Scale: Fair     Standing balance support: Bilateral upper extremity supported, During functional activity, Reliant on assistive device for balance Standing balance-Leahy Scale: Poor  Pertinent Vitals/Pain Pain Assessment Pain Assessment: Faces Faces Pain Scale: Hurts whole lot Pain Location: Pt reports "heel" on left but with palpation, appears to be more lower leg than heel. Pain Descriptors / Indicators: Sore Pain Intervention(s): Limited activity within  patient's tolerance, Monitored during session, Repositioned    Home Living Family/patient expects to be discharged to:: Private residence Living Arrangements: Children Available Help at Discharge: Family;Available 24 hours/day Type of Home: House         Home Layout: One level Home Equipment: Conservation officer, nature (2 wheels);BSC/3in1      Prior Function Prior Level of Function : Independent/Modified Independent;Patient poor historian/Family not available             Mobility Comments: per chart, pt uses RW for mobility ADLs Comments: per prior admission info, pt Mod I for ADLs, sponge bathes at baseline. pt poor historian so unable to provide accurate info     Hand Dominance   Dominant Hand: Right    Extremity/Trunk Assessment   Upper Extremity Assessment Upper Extremity Assessment: Generalized weakness    Lower Extremity Assessment Lower Extremity Assessment: Generalized weakness;LLE deficits/detail LLE Deficits / Details: Pt complaining of pain in her L heel. Noted foam dressing placed on bilateral heels. Pulled foam back to assess and noted heel felt boggy. Pt reports pain to touch in posterior lower leg but not with palpation to heel.    Cervical / Trunk Assessment Cervical / Trunk Assessment: Kyphotic  Communication   Communication: No difficulties  Cognition Arousal/Alertness: Awake/alert Behavior During Therapy: WFL for tasks assessed/performed, Flat affect Overall Cognitive Status: Impaired/Different from baseline Area of Impairment: Orientation, Attention, Memory, Following commands, Safety/judgement, Awareness, Problem solving                 Orientation Level: Disoriented to, Time, Situation, Place Current Attention Level: Sustained Memory: Decreased short-term memory Following Commands: Follows one step commands with increased time Safety/Judgement: Decreased awareness of safety, Decreased awareness of deficits Awareness: Intellectual Problem  Solving: Slow processing, Requires verbal cues, Requires tactile cues General Comments: Pt confused, oriented to self only. Repeating questions.        General Comments General comments (skin integrity, edema, etc.): Pt with difficulty feeding self. States she cannot pick up her sandwich. Therapist assisted in setting tray up. Bread taken off top of sandwich and pt used a fork to eat the tuna salad out of the sandwich. Unable to reach across tray for bag of chips or drink.    Exercises     Assessment/Plan    PT Assessment Patient needs continued PT services  PT Problem List Decreased strength;Decreased activity tolerance;Decreased balance;Decreased mobility;Decreased knowledge of use of DME;Decreased safety awareness;Decreased knowledge of precautions;Pain       PT Treatment Interventions DME instruction;Gait training;Stair training;Functional mobility training;Therapeutic activities;Therapeutic exercise;Balance training;Patient/family education    PT Goals (Current goals can be found in the Care Plan section)  Acute Rehab PT Goals Patient Stated Goal: None stated PT Goal Formulation: Patient unable to participate in goal setting Time For Goal Achievement: 11/11/22 Potential to Achieve Goals: Fair    Frequency Min 2X/week     Co-evaluation               AM-PAC PT "6 Clicks" Mobility  Outcome Measure Help needed turning from your back to your side while in a flat bed without using bedrails?: A Little Help needed moving from lying on your back to sitting on the side of a flat bed without using bedrails?: A  Lot Help needed moving to and from a bed to a chair (including a wheelchair)?: A Lot Help needed standing up from a chair using your arms (e.g., wheelchair or bedside chair)?: A Lot Help needed to walk in hospital room?: Total Help needed climbing 3-5 steps with a railing? : Total 6 Click Score: 11    End of Session Equipment Utilized During Treatment: Gait  belt Activity Tolerance: Patient limited by fatigue Patient left: in chair;with call bell/phone within reach;with chair alarm set Nurse Communication: Mobility status PT Visit Diagnosis: Unsteadiness on feet (R26.81);Pain Pain - part of body:  (L heel)    Time: 1430-1457 PT Time Calculation (min) (ACUTE ONLY): 27 min   Charges:   PT Evaluation $PT Eval Moderate Complexity: 1 Mod PT Treatments $Gait Training: 8-22 mins        Rolinda Roan, PT, DPT Acute Rehabilitation Services Secure Chat Preferred Office: [phone]   Thelma Comp 10/28/2022, 3:56 PM

## 2022-10-28 NOTE — Progress Notes (Signed)
Progress Note    [name]   [id number]  DOB: 09-15-1927  DOA: 10/23/2022     4 PCP: Saintclair Halsted, FNP  Initial CC: SOB, swelling  Hospital Course: [name] is a 89 yr old female with PMH A-fib, CHF, dysphagia, HTN, GERD, history of PE, hypothyroidism who was brought to the hospital with worsening shortness of breath and edema.  Patient resides at home with her daughter who was noted to have COVID-19.  On testing after admission, patient was found to be positive for COVID and started on treatment.  Interval History:  No events overnight. Poor memory this morning; can't remember she's here for covid and LE edema. Otherwise she's comfortable appearing and had no questions.   Assessment and Plan: * COVID-19 - Testing positive for COVID-19 on admission with new hypoxia -Completed remdesivir x 3 days.  Continue course of Decadron - Continue trending inflammatory markers - Continue COVID precautions - PCT remains negative  Hypoxia - Due to COVID-19 / pulmonary edema as discussed above. -Not on oxygen at home, wean as able  Acute on chronic diastolic CHF (congestive heart failure) (HCC) - Unclear how much of CXR findings represent CHF vs COVID PNA - BNP elevated; pulmonary edema, small pleural effusions; DOE; pulmonary crackles, B/L leg edema - continue bumex -Echo updated: Severe AS new.  EF 60 to 65%, no RWMA, mild LVH.  RV moderately enlarged and elevated PA pressures which are previously known.  Severely dilated LA and RA.  Small pericardial effusion  Severe aortic stenosis - per echo read this appears new. Per report: "Apical doppler suggests severe AS though signal could be contaminated " -Appears to have low mobility at baseline but given new findings, will consult cardiology for further input in case of need for medication adjustment - poor TAVR candidate but cardiology will continue to follow patient at discharge as well  Chronic kidney disease, stage 3b ([place]) -  patient has history of CKD3b. Baseline creat ~ 1.4, eGFR 33   Personal history of pulmonary embolism Looks like she takes coumadin chronically for this and for permanent a.fib - continue coumadin per pharmacy  Permanent atrial fibrillation ([place]) Cont coumadin BB stopped recently due to hypotension.  Chronic thromboembolic pulmonary hypertension (HCC) [place] due to prior PEs   Old records reviewed in assessment of this patient  Antimicrobials: Remdesivir 2/16 >> 10/26/2022  DVT prophylaxis:  warfarin (COUMADIN) tablet 2 mg   Code Status:   Code Status: DNR  Mobility Assessment (last 72 hours)     Mobility Assessment     Row Name 10/28/22 0800 10/27/22 0800 10/26/22 0857       Does patient have an order for bedrest or is patient medically unstable No - Continue assessment No - Continue assessment No - Continue assessment     What is the highest level of mobility based on the progressive mobility assessment? Level 2 (Chairfast) - Balance while sitting on edge of bed and cannot stand Level 1 (Bedfast) - Unable to balance while sitting on edge of bed Level 1 (Bedfast) - Unable to balance while sitting on edge of bed     Is the above level different from baseline mobility prior to current illness? Yes - Recommend PT order Yes - Recommend PT order Yes - Recommend PT order              Barriers to discharge:  Disposition Plan:  Home pending PT/OT evals Status is: Inpt  Objective: Blood pressure [name] Kitchen)  112/53, pulse 77, temperature 97.8 F (36.6 C), temperature source Oral, resp. rate 20, height 5' 5"$  (1.651 m), weight 79.8 kg, SpO2 97 %.  Examination:  Physical Exam Constitutional:      Appearance: She is well-developed.  HENT:     Head: Normocephalic and atraumatic.     Mouth/Throat:     Mouth: Mucous membranes are moist.  Eyes:     Extraocular Movements: Extraocular movements intact.  Cardiovascular:     Rate and Rhythm: Normal rate.  Pulmonary:     Effort: Pulmonary  effort is normal. No respiratory distress.     Breath sounds: Rhonchi present.  Abdominal:     General: Bowel sounds are normal. There is no distension.     Palpations: Abdomen is soft.     Tenderness: There is no abdominal tenderness.  Musculoskeletal:        General: Swelling (2+ B/L LE edema) present. Normal range of motion.     Cervical back: Normal range of motion and neck supple.  Skin:    General: Skin is warm and dry.     Comments: Chronic stasis changes noted in bilateral lower extremities  Neurological:     Mental Status: She is alert. Mental status is at baseline.  Psychiatric:        Mood and Affect: Mood normal.      Consultants:  Cardiology  Procedures:    Data Reviewed: Results for orders placed or performed during the hospital encounter of 10/23/22 (from the past 24 hour(s))  Basic metabolic panel     Status: Abnormal   Collection Time: 10/28/22  2:44 AM  Result Value Ref Range   Sodium 131 (L) 135 - 145 mmol/L   Potassium 4.4 3.5 - 5.1 mmol/L   Chloride 87 (L) 98 - 111 mmol/L   CO2 33 (H) 22 - 32 mmol/L   Glucose, Bld 118 (H) 70 - 99 mg/dL   BUN 19 8 - 23 mg/dL   Creatinine, Ser 1.39 (H) 0.44 - 1.00 mg/dL   Calcium 8.6 (L) 8.9 - 10.3 mg/dL   GFR, Estimated 36 (L) >60 mL/min   Anion gap 11 5 - 15  Protime-INR     Status: Abnormal   Collection Time: 10/28/22  2:44 AM  Result Value Ref Range   Prothrombin Time 22.1 (H) 11.4 - 15.2 seconds   INR 2.0 (H) 0.8 - 1.2  CBC with Differential/Platelet     Status: Abnormal   Collection Time: 10/28/22  2:44 AM  Result Value Ref Range   WBC 3.0 (L) 4.0 - 10.5 K/uL   RBC 3.87 3.87 - 5.11 MIL/uL   Hemoglobin 10.4 (L) 12.0 - 15.0 g/dL   HCT 33.5 (L) 36.0 - 46.0 %   MCV 86.6 80.0 - 100.0 fL   MCH 26.9 26.0 - 34.0 pg   MCHC 31.0 30.0 - 36.0 g/dL   RDW 15.7 (H) 11.5 - 15.5 %   Platelets 86 (L) 150 - 400 K/uL   nRBC 0.0 0.0 - 0.2 %   Neutrophils Relative % 77 %   Neutro Abs 2.3 1.7 - 7.7 K/uL   Lymphocytes  Relative 17 %   Lymphs Abs 0.5 (L) 0.7 - 4.0 K/uL   Monocytes Relative 5 %   Monocytes Absolute 0.2 0.1 - 1.0 K/uL   Eosinophils Relative 0 %   Eosinophils Absolute 0.0 0.0 - 0.5 K/uL   Basophils Relative 0 %   Basophils Absolute 0.0 0.0 - 0.1 K/uL   Immature  Granulocytes 1 %   Abs Immature Granulocytes 0.02 0.00 - 0.07 K/uL  Magnesium     Status: None   Collection Time: 10/28/22  2:44 AM  Result Value Ref Range   Magnesium 1.9 1.7 - 2.4 mg/dL  C-reactive protein     Status: Abnormal   Collection Time: 10/28/22  2:44 AM  Result Value Ref Range   CRP 1.0 (H) <1.0 mg/dL  D-dimer, quantitative     Status: Abnormal   Collection Time: 10/28/22  2:44 AM  Result Value Ref Range   D-Dimer, Quant 0.95 (H) 0.00 - 0.50 ug/mL-FEU  Lactate dehydrogenase     Status: None   Collection Time: 10/28/22  2:44 AM  Result Value Ref Range   LDH 145 98 - 192 U/L    I have reviewed pertinent nursing notes, vitals, labs, and images as necessary. I have ordered labwork to follow up on as indicated.  I have reviewed the last notes from staff over past 24 hours. I have discussed patient's care plan and test results with nursing staff, CM/SW, and other staff as appropriate.  Time spent: Greater than 50% of the 55 minute visit was spent in counseling/coordination of care for the patient as laid out in the A&P.   LOS: 4 days   Dwyane Dee, MD [facility] 10/28/2022, 1:06 PM

## 2022-10-28 NOTE — Progress Notes (Signed)
Heart Failure Navigator Progress Note  Assessed for Heart & Vascular TOC clinic readiness.  Patient admitted for worsening LE edema and increased shortness of breath. Positive for COVID. Worsening AS on ECHO. EF 60-65%. Discussed with Dr. Percival Spanish and will not schedule HF TOC appt at this time.   Navigator available for reassessment of patient.   Kerby Nora, PharmD, BCPS Heart Failure Stewardship Pharmacist Phone [phone]

## 2022-10-29 DIAGNOSIS — U071 COVID-19: Secondary | ICD-10-CM | POA: Diagnosis not present

## 2022-10-29 LAB — CBC WITH DIFFERENTIAL/PLATELET
Abs Immature Granulocytes: 0.01 10*3/uL (ref 0.00–0.07)
Basophils Absolute: 0 10*3/uL (ref 0.0–0.1)
Basophils Relative: 0 %
Eosinophils Absolute: 0 10*3/uL (ref 0.0–0.5)
Eosinophils Relative: 0 %
HCT: 35 % — ABNORMAL LOW (ref 36.0–46.0)
Hemoglobin: 11.1 g/dL — ABNORMAL LOW (ref 12.0–15.0)
Immature Granulocytes: 0 %
Lymphocytes Relative: 16 %
Lymphs Abs: 0.4 10*3/uL — ABNORMAL LOW (ref 0.7–4.0)
MCH: 27 pg (ref 26.0–34.0)
MCHC: 31.7 g/dL (ref 30.0–36.0)
MCV: 85.2 fL (ref 80.0–100.0)
Monocytes Absolute: 0.1 10*3/uL (ref 0.1–1.0)
Monocytes Relative: 3 %
Neutro Abs: 2.1 10*3/uL (ref 1.7–7.7)
Neutrophils Relative %: 81 %
Platelets: 80 10*3/uL — ABNORMAL LOW (ref 150–400)
RBC: 4.11 MIL/uL (ref 3.87–5.11)
RDW: 15.8 % — ABNORMAL HIGH (ref 11.5–15.5)
WBC: 2.6 10*3/uL — ABNORMAL LOW (ref 4.0–10.5)
nRBC: 0 % (ref 0.0–0.2)

## 2022-10-29 LAB — BASIC METABOLIC PANEL
Anion gap: 6 (ref 5–15)
BUN: 20 mg/dL (ref 8–23)
CO2: 40 mmol/L — ABNORMAL HIGH (ref 22–32)
Calcium: 9 mg/dL (ref 8.9–10.3)
Chloride: 81 mmol/L — ABNORMAL LOW (ref 98–111)
Creatinine, Ser: 1.36 mg/dL — ABNORMAL HIGH (ref 0.44–1.00)
GFR, Estimated: 37 mL/min — ABNORMAL LOW (ref >60)
Glucose, Bld: 125 mg/dL — ABNORMAL HIGH (ref 70–99)
Potassium: 4 mmol/L (ref 3.5–5.1)
Sodium: 127 mmol/L — ABNORMAL LOW (ref 135–145)

## 2022-10-29 LAB — PROTIME-INR
INR: 2.3 — ABNORMAL HIGH (ref 0.8–1.2)
Prothrombin Time: 25.1 seconds — ABNORMAL HIGH (ref 11.4–15.2)

## 2022-10-29 LAB — MAGNESIUM: Magnesium: 1.9 mg/dL (ref 1.7–2.4)

## 2022-10-29 MED ORDER — WARFARIN SODIUM 1 MG PO TABS
1.0000 mg | ORAL_TABLET | Freq: Once | ORAL | Status: AC
  Administered 2022-10-29: 1 mg via ORAL
  Filled 2022-10-29: qty 1

## 2022-10-29 NOTE — TOC Progression Note (Addendum)
Transition of Care [facility]) - Initial/Assessment Note    Patient Details  Name: [name] MRN: [id number] Date of Birth: Jun 28, 1928  Transition of Care [facility]) CM/SW Contact:    Milinda Antis, [place] Phone Number: 10/29/2022, 10:30 AM  Clinical Narrative:                 LCSW notified by Dr. Sabino Gasser and RNCM that patient's family is requesting that patient be discharged to a short term SNF.   LCSW contacted the patient's daughter to discuss further and there was no answer.  LCSW left a VM requesting a returned call.   15:45-CSW received a returned call from the patient's daughter.  The daughter expressed understanding of PT recommendation and is agreeable to SNF placement at time of discharge. Patient reports preference for "a good facility". CSW discussed insurance authorization process and will provided daughter with the website where the  Medicare SNF ratings list can be accessed.  CSW will send out referrals for review and provide bed offers as available.   Skilled Nursing Rehab Facilities-   [email or web address]   Ratings out of 5 stars (5 the highest)   Name Address  Phone # [place] Inspection Overall  [facility] [address], [place] 4 5 2 3  $ [facility]  [address] [place], [place] [phone] 4 2 5 5  $ [facility] [place], [place] 1 3 1 1  $ [place], [place] 2 2 4 4  $ [facility] [address] [place] 2 1 2 1  $ [place] 3 3 4 4  $ [facility] [address] [place] 4 1 3 2  $ [facility] [address], [place] 4 1 3 2  $ [address] ([place]) [place], [place] [phone] 3 1 2 1  $ [facility] [phone] [address], [place] [phone] 3 1 1 1  $ [facility] [address], [facility] [phone] 3 2 2 2  $ [facility] ([place]) [place]. [place], [place] [phone] 3 1 1 1  $ Dustin Flock [address] [phone] 4 2 4 4          $ [place]      [facility] 4 1 3 2  $ [facility]  [address] [place] 3 1 5 4  $ [facility], [place] [address], [place] [phone] 1 1 2 1  $ [facility] [address] [facility] [phone] 2 2 4 4          $ [address] (no [facility]) [place] [facility] [address], [place] [phone] 5 5 5 5  $ [facility] (No Humana) [address], [place] 4 1 4 3  $ [facility] (No UHC) [place], [place] [address] 5 5 5 5  $ [facility] [address], [facility] [phone] 2 3 5 5  $ [place] [address] [place] 1 1 2 1  $ [facility] [address] 3 1 1 1  $ [place], [place] 5 2 5 5  $ [facility]  [address] [place] 2 2 1 1  $ [facility] [address] [place] 3 2 1 1  $ [facility] [place], [place] 2 2 2 $ 2  [facility] [address], [place] [phone] 1 1 1 1  $ [place] [address] [place]  [phone] 2 4 3 3  $ [facility] [address], [place] [phone] 3 2 3 3  $ [place] [address], [place] 2 1 1 1  $ [place] (No Humana) [address] [place] [phone] 2 2 3 3  $ [facility]) [address], [place] [phone] 2 1 1 1          $ [facility] [place], [place] 5 4 5 5  $ [facility])  [phone], [place] 2 1 2 1  $ [facility]) [place] [address], [place] 3 1 4 3   $ [facility] [place] [address], [place] 3 3 4 4  $ [address] [place], [place] 2 3 1 1  $ [facility]) [place] [phone] 2 1 4 3      $ Barriers to Discharge: Continued Medical Work up   Patient Goals and CMS Choice Patient states their goals for this hospitalization and ongoing recovery are:: To return home CMS Medicare.gov Compare Post Acute Care list provided to:: Patient Choice offered to / list presented to : Patient, Adult Children (Daughter, [name])      Expected Discharge Plan and Services   Discharge Planning Services: CM Consult   Living arrangements for the past 2 months: Single Family Home                                      Prior Living Arrangements/Services Living arrangements for the past 2 months: Single Family Home Lives with:: Adult Children (Daughter, [name]) Patient language and need for interpreter reviewed:: Yes Do you feel safe going back to the place where you live?: Yes      Need for Family Participation in Patient Care: Yes (Comment) Care giver support system in place?: Yes (comment)   Criminal Activity/Legal Involvement Pertinent to Current Situation/Hospitalization: No - Comment as needed  Activities of Daily Living Home Assistive Devices/Equipment: Walker (specify type) ADL Screening (condition at time of admission) Patient's cognitive ability adequate to safely complete daily activities?: Yes Is the patient deaf or have difficulty hearing?: No Does the patient have difficulty seeing, even when wearing glasses/contacts?: No Does the patient have difficulty concentrating, remembering, or making decisions?: Yes Patient able to express need for assistance with ADLs?: Yes Does the patient have difficulty dressing or bathing?: Yes Independently performs ADLs?: No Communication: Independent Dressing (OT): Needs assistance Is this a change  from baseline?: Change from baseline, expected to last >3 days Grooming: Needs assistance Is this a change from baseline?: Change from baseline, expected to last >3 days Feeding: Independent Bathing: Needs assistance Is this a change from baseline?: Change from baseline, expected to last >3 days Toileting: Needs assistance Is this a change from baseline?: Change from baseline, expected to last >3days In/Out Bed: Dependent Is this a change from baseline?: Change from baseline, expected to last >3 days Walks in Home: Needs assistance Is this a change from baseline?: Change from baseline, expected to last >3 days Does the patient have difficulty walking or climbing stairs?: Yes Weakness of Legs: Both Weakness of Arms/Hands: Both  Permission Sought/Granted Permission sought to share information with : Case Manager, Family Supports Permission granted to share information  with : Yes, Verbal Permission Granted              Emotional Assessment Appearance:: Appears stated age Attitude/Demeanor/Rapport: Engaged, Gracious Affect (typically observed): Accepting, Appropriate, Calm, Hopeful, Pleasant Orientation: : Oriented to Self Alcohol / Substance Use: Not Applicable Psych Involvement: No (comment)  Admission diagnosis:  Anasarca [R60.1] Peripheral edema [R60.9] Acute respiratory failure with hypoxia ([place]) [J96.01] Pneumonia due to COVID-19 virus [U07.1, J12.82] COVID-19 [U07.1] Patient Active Problem List   Diagnosis Date Noted   Severe aortic stenosis 10/26/2022   Pneumonia due to COVID-19 virus 10/24/2022   COVID-19 10/23/2022   Hypoxia 10/23/2022   Positive blood culture 11/20/2021   Generalized weakness 11/20/2021   Thrombocytopenia ([place]) 11/17/2021   Anemia of chronic disease 11/17/2021   AKI on CKD-3B 11/17/2021   Osteomyelitis ([place]) 11/16/2021   Acute on chronic diastolic CHF (congestive heart failure) ([place]) 11/16/2021   Hyponatremia 11/16/2021   Chronic kidney  disease, stage 3b ([place]) 11/16/2021   Leukocytosis 11/16/2021   Uncontrolled hypertension 11/15/2021   Hyperthyroidism 11/15/2021   Peripheral venous insufficiency 11/15/2021   Personal history of pulmonary embolism 11/15/2021   Osteomyelitis of second toe of right foot ([place]) 11/15/2021   Cellulitis 11/15/2021   Chronic thromboembolic pulmonary hypertension ([place]) 05/12/2014   Permanent atrial fibrillation ([place]) 06/20/2009   Chronic obstructive lung disease ([place]) 04/04/2009   Gastroesophageal reflux disease 12/25/2008   Generalized anxiety disorder 12/25/2008   Hypercholesterolemia 12/25/2008   Hypothyroidism 12/25/2008   PCP:  Saintclair Halsted, FNP Pharmacy:   [id number] [place], [id number]: [phone]: [phone]    Social Determinants of Health (SDOH) Social History: SDOH Screenings   Food Insecurity: Food Insecurity Present (10/27/2022)  Housing: Low Risk  (10/27/2022)  Transportation Needs: No Transportation Needs (10/27/2022)  Utilities: Not At Risk (10/27/2022)  Tobacco Use: Medium Risk (10/23/2022)   SDOH Interventions:     Readmission Risk Interventions    11/18/2021   12:55 PM  Readmission Risk Prevention Plan  Transportation Screening Complete  PCP or Specialist Appt within 3-5 Days Complete  HRI or [place]  Social Work Consult for RVeteranPlanning/Counseling Complete  Palliative Care Screening Complete  Medication Review (Press photographer Complete

## 2022-10-29 NOTE — [place] FL2 (Signed)
[place] MEDICAID FL2 LEVEL OF CARE FORM     IDENTIFICATION  Patient Name: [name] Birthdate: [birth year] Sex: female Admission Date (Current Location): 10/23/2022  [facility] [place] Number:  Herbalist and Address:  [facility] [place], [place] [address], [place], [place] [address]      Provider Number: [id number]  Attending Physician Name and Address:  Lavina Hamman, MD  Relative Name and Phone Number:  Algie Coffer (Daughter) [phone]    Current Level of Care: Hospital Recommended Level of Care: [place] Prior Approval Number:    Date Approved/Denied:   PASRR Number: pending  Discharge Plan: SNF    Current Diagnoses: Patient Active Problem List   Diagnosis Date Noted   Severe aortic stenosis 10/26/2022   Pneumonia due to COVID-19 virus 10/24/2022   COVID-19 10/23/2022   Hypoxia 10/23/2022   Positive blood culture 11/20/2021   Generalized weakness 11/20/2021   Thrombocytopenia ([place]) 11/17/2021   Anemia of chronic disease 11/17/2021   AKI on CKD-3B 11/17/2021   Osteomyelitis ([place]) 11/16/2021   Acute on chronic diastolic CHF (congestive heart failure) ([place]) 11/16/2021   Hyponatremia 11/16/2021   Chronic kidney disease, stage 3b ([place]) 11/16/2021   Leukocytosis 11/16/2021   Uncontrolled hypertension 11/15/2021   Hyperthyroidism 11/15/2021   Peripheral venous insufficiency 11/15/2021   Personal history of pulmonary embolism 11/15/2021   Osteomyelitis of second toe of right foot ([place]) 11/15/2021   Cellulitis 11/15/2021   Chronic thromboembolic pulmonary hypertension ([place]) 05/12/2014   Permanent atrial fibrillation ([place]) 06/20/2009   Chronic obstructive lung disease ([place]) 04/04/2009   Gastroesophageal reflux disease 12/25/2008   Generalized anxiety disorder 12/25/2008   Hypercholesterolemia 12/25/2008   Hypothyroidism 12/25/2008    Orientation RESPIRATION BLADDER Height & Weight        O2 (3L nasal  cannula) Incontinent Weight: 175 lb 14.8 oz (79.8 kg) Height:  5' 5"$  (165.1 cm)  BEHAVIORAL SYMPTOMS/MOOD NEUROLOGICAL BOWEL NUTRITION STATUS      Continent Diet (see d/c summary)  AMBULATORY STATUS COMMUNICATION OF NEEDS Skin   Total Care Verbally Normal                       Personal Care Assistance Level of Assistance  Bathing, Dressing, Feeding Bathing Assistance: Maximum assistance Feeding assistance: Limited assistance Dressing Assistance: Maximum assistance     Functional Limitations Info  Sight, Hearing, Speech Sight Info: Impaired Hearing Info: Adequate Speech Info: Adequate    SPECIAL CARE FACTORS FREQUENCY  OT (By licensed OT), PT (By licensed PT)     PT Frequency: 5x/ week OT Frequency: 5x/ week            Contractures Contractures Info: Not present    Additional Factors Info  Allergies, Code Status Code Status Info: DNR Allergies Info: Amoxicillin  Azithromycin  Codeine  Erythromycin  Green Dyes  Iodine  Misc. Sulfonamide Containing Compounds  Oxycodone  Oxycodone-acetaminophen  Penicillins  Sulfasalazine  Tramadol           Current Medications (10/29/2022):  This is the current hospital active medication list Current Facility-Administered Medications  Medication Dose Route Frequency Provider Last Rate Last Admin   0.9 %  sodium chloride infusion  250 mL Intravenous PRN Etta Quill, DO       acetaminophen (TYLENOL) tablet 650 mg  650 mg Oral Q4H PRN Etta Quill, DO   650 mg at 10/29/22 0550   bumetanide (BUMEX) tablet 2 mg  2  mg Oral BID Swayze, Ava, DO   2 mg at 10/29/22 [id number]   cholecalciferol (VITAMIN D3) 25 MCG (1000 UNIT) tablet 1,000 Units  1,000 Units Oral Daily Etta Quill, DO   1,000 Units at 10/29/22 [id number]   cyanocobalamin (VITAMIN B12) tablet 1,000 mcg  1,000 mcg Oral Daily Etta Quill, DO   1,000 mcg at 10/29/22 0854   dexamethasone (DECADRON) tablet 6 mg  6 mg Oral Q24H [place]olo, Adam, DO   6 mg at 10/28/22 2020    gabapentin (NEURONTIN) capsule 300 mg  300 mg Oral BID Etta Quill, DO   300 mg at 10/29/22 [id number]   levothyroxine (SYNTHROID) tablet 100 mcg  100 mcg Oral Q0600 Etta Quill, DO   100 mcg at 10/29/22 0545   mupirocin cream (BACTROBAN) 2 %   Topical Daily Swayze, Ava, DO   Given at 10/29/22 0855   ondansetron (ZOFRAN) injection 4 mg  4 mg Intravenous Q6H PRN Etta Quill, DO       pantoprazole (PROTONIX) EC tablet 40 mg  40 mg Oral Daily Jennette Kettle M, DO   40 mg at 10/29/22 [id number]   polyethylene glycol (MIRALAX / GLYCOLAX) packet 17 g  17 g Oral Daily Dwyane Dee, MD   17 g at 10/29/22 0854   polyvinyl alcohol (LIQUIFILM TEARS) 1.4 % ophthalmic solution 1 drop  1 drop Both Eyes BID Etta Quill, DO   1 drop at 10/29/22 [id number]   senna-docusate (Senokot-S) tablet 1 tablet  1 tablet Oral BID Dwyane Dee, MD   1 tablet at 10/29/22 0853   sodium chloride flush (NS) 0.9 % injection 3 mL  3 mL Intravenous Q12H Jennette Kettle M, DO   3 mL at 10/29/22 [id number]   sodium chloride flush (NS) 0.9 % injection 3 mL  3 mL Intravenous PRN Etta Quill, DO       spironolactone (ALDACTONE) tablet 12.5 mg  12.5 mg Oral Daily Lily Kocher, PA-C   12.5 mg at 10/29/22 [id number]   Warfarin - Pharmacist Dosing Inpatient   Does not apply q1600 Laren Everts, [facility]   Given by Other at 10/26/22 1608     Discharge Medications: Please see discharge summary for a list of discharge medications.  Relevant Imaging Results:  Relevant Lab Results:   Additional Information SSN: [id number] F Samanatha Brammer, LCSWA

## 2022-10-29 NOTE — Progress Notes (Signed)
Pt's daughter and caregiver called with concern's for pt''s toes. Pt c/o pain in foot to daughter. 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 Pt normally has poor circulation per pt/dtr. Upon assessment, toes on both feet have dried scabs. Dorsal pedal pulses +2. Cap refill <3. L 4th toe has multiple dry wounds, darker in color ,dusky but warm to the touch with good cap refill. Pt denies pain at this time. Pt has memory issues but eventually stated that earlier in the day she did have a shooting pain across the top of her foot where her toes branched off. Daughter would like MD to evaluated toes.

## 2022-10-29 NOTE — Progress Notes (Signed)
[facility] Progress Note Patient: [name] [id number] DOB: 10/06/1927 DOA: 10/23/2022  DOS: the patient was seen and examined on 10/29/2022  Brief hospital course: Ms. Handal is a 89 yr old female with PMH A-fib, CHF, dysphagia, HTN, GERD, history of PE, hypothyroidism who was brought to the hospital with worsening shortness of breath and edema.  Patient resides at home with her daughter who was noted to have COVID-19.  On testing after admission, patient was found to be positive for COVID and started on treatment. Assessment and Plan: COVID-19 infection Testing positive for COVID-19 on admission with new hypoxia Completed remdesivir x 3 days.  Continue course of Decadron Continue COVID precautions PCT remains negative   Hypoxia Due to COVID-19 / pulmonary edema as discussed above. Not on oxygen at home, wean as able   Acute on chronic diastolic CHF (congestive heart failure) ([place]) Unclear how much of CXR findings represent CHF vs COVID PNA BNP elevated; pulmonary edema, small pleural effusions; DOE; pulmonary crackles, B/L leg edema Continue bumex and Aldactone Echo updated: Severe AS new.  EF 60 to 65%, no RWMA, mild LVH.  RV moderately enlarged and elevated PA pressures which are previously known.  Severely dilated LA and RA.  Small pericardial effusion   Severe aortic stenosis Per echo read this appears new. Per report: "Apical doppler suggests severe AS though signal could be contaminated " Cardiology was consulted. Poor TAVR candidate but cardiology will continue to follow patient at discharge as well   Chronic kidney disease, stage 3b [facility]) patient has history of CKD3b. Baseline creat ~ 1.4, eGFR 33   Personal history of pulmonary embolism Looks like she takes coumadin chronically for this and for permanent a.fib continue coumadin per pharmacy   Permanent atrial fibrillation ([place]) Cont coumadin BB stopped recently due to hypotension.   Chronic thromboembolic  pulmonary hypertension (HCC) PAH due to prior PEs   Subjective: Denies any acute complaint.  Has some fatigue and tiredness.  No cough.  No chest pain.  Still on oxygen.  Physical Exam: General: in Mild distress, No Rash Cardiovascular: S1 and S2 Present, No Murmur Respiratory: Good respiratory effort, Bilateral Air entry present.  Bilateral basal crackles, No wheezes Abdomen: Bowel Sound present, No tenderness Extremities: Bilateral edema, no evidence of toe infections Neuro: Alert and oriented x3, no new focal deficit  Data Reviewed: I have Reviewed nursing notes, Vitals, and Lab results. Since last encounter, pertinent lab results CBC and BMP   . I have ordered test including CBC and BMP  .   Disposition: Status is: Inpatient Remains inpatient appropriate because: Need for diuresis   Family Communication: No one at bedside Level of care: Telemetry Medical Continue telemetry for CHF Vitals:   10/28/22 2139 10/29/22 0437 10/29/22 0845 10/29/22 1511  BP: 128/68 (!) 144/59 (!) 175/70 (!) 151/75  Pulse: 81 77 67 78  Resp: 16 18 19 18  $ Temp: 98.5 F (36.9 C) 97.7 F (36.5 C) (!) 97.3 F (36.3 C) 98 F (36.7 C)  TempSrc: Oral Oral Oral Oral  SpO2: (!) 85% 95% 99% 99%  Weight:      Height:         Author: Berle Mull, MD 10/29/2022 6:48 PM  Please look on [email or web address] to find out who is on call.

## 2022-10-29 NOTE — TOC Progression Note (Addendum)
Transition of Care [facility]) - Initial/Assessment Note    Patient Details  Name: [name] MRN: [id number] Date of Birth: [birth year]  Transition of Care [facility]) CM/SW Contact:    Milinda Antis, [place] Phone Number: 10/29/2022, 10:33 AM  Clinical Narrative:                 RE: [name] Date of Birth: [birth year] Date: 10/29/2022  Please be advised that the above-named patient has a primary diagnosis of dementia which supersedes any psychiatric diagnosis. Patient will require a short-term nursing home stay - anticipated 30 days or less for rehabilitation and strengthening.  The plan is for return home.     Barriers to Discharge: Continued Medical Work up   Patient Goals and CMS Choice Patient states their goals for this hospitalization and ongoing recovery are:: To return home CMS Medicare.gov Compare Post Acute Care list provided to:: Patient Choice offered to / list presented to : Patient, Adult Children (Daughter, [name])      Expected Discharge Plan and Services   Discharge Planning Services: CM Consult   Living arrangements for the past 2 months: Single Family Home                                      Prior Living Arrangements/Services Living arrangements for the past 2 months: Single Family Home Lives with:: Adult Children (Daughter, [name]) Patient language and need for interpreter reviewed:: Yes Do you feel safe going back to the place where you live?: Yes      Need for Family Participation in Patient Care: Yes (Comment) Care giver support system in place?: Yes (comment)   Criminal Activity/Legal Involvement Pertinent to Current Situation/Hospitalization: No - Comment as needed  Activities of Daily Living Home Assistive Devices/Equipment: Walker (specify type) ADL Screening (condition at time of admission) Patient's cognitive ability adequate to safely complete daily activities?: Yes Is the patient deaf or have difficulty hearing?: No Does the patient  have difficulty seeing, even when wearing glasses/contacts?: No Does the patient have difficulty concentrating, remembering, or making decisions?: Yes Patient able to express need for assistance with ADLs?: Yes Does the patient have difficulty dressing or bathing?: Yes Independently performs ADLs?: No Communication: Independent Dressing (OT): Needs assistance Is this a change from baseline?: Change from baseline, expected to last >3 days Grooming: Needs assistance Is this a change from baseline?: Change from baseline, expected to last >3 days Feeding: Independent Bathing: Needs assistance Is this a change from baseline?: Change from baseline, expected to last >3 days Toileting: Needs assistance Is this a change from baseline?: Change from baseline, expected to last >3days In/Out Bed: Dependent Is this a change from baseline?: Change from baseline, expected to last >3 days Walks in Home: Needs assistance Is this a change from baseline?: Change from baseline, expected to last >3 days Does the patient have difficulty walking or climbing stairs?: Yes Weakness of Legs: Both Weakness of Arms/Hands: Both  Permission Sought/Granted Permission sought to share information with : Case Manager, Family Supports Permission granted to share information with : Yes, Verbal Permission Granted              Emotional Assessment Appearance:: Appears stated age Attitude/Demeanor/Rapport: Engaged, Gracious Affect (typically observed): Accepting, Appropriate, Calm, Hopeful, Pleasant Orientation: : Oriented to Self Alcohol / Substance Use: Not Applicable Psych Involvement: No (comment)  Admission diagnosis:  Anasarca [R60.1] Peripheral edema [R60.9] Acute  respiratory failure with hypoxia ([place]) [J96.01] Pneumonia due to COVID-19 virus [U07.1, J12.82] COVID-19 [U07.1] Patient Active Problem List   Diagnosis Date Noted   Severe aortic stenosis 10/26/2022   Pneumonia due to COVID-19 virus  10/24/2022   COVID-19 10/23/2022   Hypoxia 10/23/2022   Positive blood culture 11/20/2021   Generalized weakness 11/20/2021   Thrombocytopenia ([place]) 11/17/2021   Anemia of chronic disease 11/17/2021   AKI on CKD-3B 11/17/2021   Osteomyelitis ([place]) 11/16/2021   Acute on chronic diastolic CHF (congestive heart failure) ([place]) 11/16/2021   Hyponatremia 11/16/2021   Chronic kidney disease, stage 3b ([place]) 11/16/2021   Leukocytosis 11/16/2021   Uncontrolled hypertension 11/15/2021   Hyperthyroidism 11/15/2021   Peripheral venous insufficiency 11/15/2021   Personal history of pulmonary embolism 11/15/2021   Osteomyelitis of second toe of right foot ([place]) 11/15/2021   Cellulitis 11/15/2021   Chronic thromboembolic pulmonary hypertension ([place]) 05/12/2014   Permanent atrial fibrillation ([place]) 06/20/2009   Chronic obstructive lung disease ([place]) 04/04/2009   Gastroesophageal reflux disease 12/25/2008   Generalized anxiety disorder 12/25/2008   Hypercholesterolemia 12/25/2008   Hypothyroidism 12/25/2008   PCP:  Saintclair Halsted, FNP Pharmacy:   [id number] [id number]: [phone]: [phone]    Social Determinants of Health (SDOH) Social History: SDOH Screenings   Food Insecurity: Food Insecurity Present (10/27/2022)  Housing: Low Risk  (10/27/2022)  Transportation Needs: No Transportation Needs (10/27/2022)  Utilities: Not At Risk (10/27/2022)  Tobacco Use: Medium Risk (10/23/2022)   SDOH Interventions:     Readmission Risk Interventions    11/18/2021   12:55 PM  Readmission Risk Prevention Plan  Transportation Screening Complete  PCP or Specialist Appt within 3-5 Days Complete  [place] or [place]  Social Work Consult for RSeymourPlanning/Counseling Complete  Palliative Care Screening Complete  Medication Review (Press photographer Complete

## 2022-10-29 NOTE — Progress Notes (Signed)
Mobility Specialist Progress Note   10/29/22 1050  Mobility  Activity Transferred to/from [facility]  Level of Assistance Moderate assist, patient does 50-74%  Assistive Device Front wheel walker  Distance Ambulated (ft) 2 ft  Activity Response Tolerated well  Mobility Referral Yes  $Mobility charge 1 Mobility   Pt requesting assistance to get from chair to [facility] d/t BM urgency. Required ModA on initial stand d/t general weakness and low seated level. Pt impulsive d/t urgency and needed multimodal cues to safely turn and pivot to [facility]. Pt left w/ call bell in reach and NT notified about placement.   Holland Falling Mobility Specialist Please contact via SecureChat or  Rehab office at [phone]

## 2022-10-29 NOTE — Progress Notes (Signed)
ANTICOAGULATION CONSULT NOTE - Follow Up Consult  Pharmacy Consult for Warfarin Indication: atrial fibrillation and hx PE  Patient Measurements: Height: 5' 5"$  (165.1 cm) Weight: 79.8 kg (175 lb 14.8 oz) IBW/kg (Calculated) : 57  Vital Signs: Temp: 97.3 F (36.3 C) (02/21 0845) Temp Source: Oral (02/21 0845) BP: 175/70 (02/21 0845) Pulse Rate: 67 (02/21 0845)  Labs: Recent Labs    10/27/22 0209 10/28/22 0244 10/29/22 0301  HGB 10.6* 10.4* 11.1*  HCT 33.8* 33.5* 35.0*  PLT 88* 86* 80*  LABPROT 22.3* 22.1* 25.1*  INR 2.0* 2.0* 2.3*  CREATININE 1.38* 1.39* 1.36*     Estimated Creatinine Clearance: 27.5 mL/min (A) (by C-G formula based on SCr of 1.36 mg/dL (H)).  Assessment: 89 yr old female admitted for hypoxia in the setting of COVID and anasarca. Pharmacy consulted for continuation of PTA Warfarin for atrial fibrillation and hx PE.    PTA warfarin regimen: 2 mg MWF, 1 mg TTSS  INR therapeutic (2.4) on admit, but up to 3.6 on 2/17 and warfarin dose held.  INR 2.5 on 2/18 and warfarin resumed with 2 mg. INR trended down to 2.0 on 2/19 and 2 mg dose repeated > same INR 2.0 on 2/20 and 2 mg dose continued.  INR 2.3 today. Platelet count trended down to 88>86>80.  No bleeding reported per RN.  Remdesivir completed 2/18.  On day #7 of 10 days Dexamethasone 6 mg PO daily.   Goal of Therapy:  INR 2-3 Monitor platelets by anticoagulation protocol: Yes   Plan:  Warfarin 1 mg x 1 today. Daily PT/INR and CBC. Monitor for signs/symptoms of bleeding.  Arty Baumgartner, [place] 10/29/2022,11:08 AM

## 2022-10-29 NOTE — Progress Notes (Signed)
Occupational Therapy Treatment Patient Details Name: [name] MRN: [id number] DOB: [birth year] Today's Date: 10/29/2022   History of present illness Pt is a 89 y/o female admitted for hypoxia in setting of COVID-19 infection and acute on chronic CHF. PMH: a fib, cHF, PE, R toe amputation, HTN, hiatal hernia, thyroid disease.   OT comments  Pt with improving participation and cognition though deficits still evident. Focus on standing trials with RW at min guard, standing tolerance during LB ADLs and sequencing of basic tasks. Pt requires Total A for LB ADLs in standing d/t poor standing balance and reliance on UE support. Pt also w/ frequent urinary incontinence during session. Noted CM/SW spoke w/ daughter (who also has COVID and not feeling well) in regards to assist level needed at home. Per daughter, currently prefer SNF rehab at DC and feel this is an appropriate DC venue.   Recommendations for follow up therapy are one component of a multi-disciplinary discharge planning process, led by the attending physician.  Recommendations may be updated based on patient status, additional functional criteria and insurance authorization.    Follow Up Recommendations  Skilled nursing-short term rehab (<3 hours/day)     Assistance Recommended at Discharge Frequent or constant Supervision/Assistance  Patient can return home with the following  A lot of help with bathing/dressing/bathroom;A little help with walking and/or transfers   Equipment Recommendations  None recommended by OT    Recommendations for Other Services      Precautions / Restrictions Precautions Precautions: Fall Precaution Comments: monitor O2 (does not wear at baseline); urine incontinence Restrictions Weight Bearing Restrictions: No       Mobility Bed Mobility               General bed mobility comments: up in chair on entry    Transfers Overall transfer level: Needs assistance Equipment used: Rolling  walker (2 wheels) Transfers: Sit to/from Stand Sit to Stand: Min guard           General transfer comment: able to stand x3 from recliner w/ L UE on armrest and R UE on front part of RW; min guard and slow to rise. able to stand approx 4 min for LB ADL assist     Balance Overall balance assessment: Needs assistance Sitting-balance support: Feet supported, No upper extremity supported Sitting balance-Leahy Scale: Fair     Standing balance support: Bilateral upper extremity supported, During functional activity, Reliant on assistive device for balance Standing balance-Leahy Scale: Poor                             ADL either performed or assessed with clinical judgement   ADL Overall ADL's : Needs assistance/impaired                 Upper Body Dressing : Minimal assistance;Sitting   Lower Body Dressing: Total assistance;Sit to/from stand Lower Body Dressing Details (indicate cue type and reason): Total A to doff/don clean socks and doff/don clean mesh underwear d/t urinary incontinence               General ADL Comments: Focus on standing trials, endurance and sequencing basic tasks    Extremity/Trunk Assessment Upper Extremity Assessment Upper Extremity Assessment: Generalized weakness   Lower Extremity Assessment Lower Extremity Assessment: Defer to PT evaluation        Vision   Vision Assessment?: No apparent visual deficits   Perception     Praxis  Cognition Arousal/Alertness: Awake/alert Behavior During Therapy: WFL for tasks assessed/performed, Flat affect Overall Cognitive Status: Impaired/Different from baseline Area of Impairment: Orientation, Attention, Memory, Following commands, Safety/judgement, Awareness, Problem solving                 Orientation Level: Disoriented to, Time, Situation Current Attention Level: Sustained Memory: Decreased short-term memory Following Commands: Follows one step commands with  increased time Safety/Judgement: Decreased awareness of safety, Decreased awareness of deficits Awareness: Intellectual Problem Solving: Slow processing, Requires verbal cues, Requires tactile cues General Comments: showing insight into place, situation though confusion still noted. Pt w/ decreased awareness of needs, current abilities and what assist needed. reports self as 89 y/o        Exercises      Shoulder Instructions       General Comments      Pertinent Vitals/ Pain       Pain Assessment Pain Assessment: Faces Faces Pain Scale: Hurts a little bit Pain Location: R knee Pain Descriptors / Indicators: Sore Pain Intervention(s): Monitored during session  Home Living                                          Prior Functioning/Environment              Frequency  Min 2X/week        Progress Toward Goals  OT Goals(current goals can now be found in the care plan section)  Progress towards OT goals: Progressing toward goals  Acute Rehab OT Goals Patient Stated Goal: be able to go home OT Goal Formulation: With patient Time For Goal Achievement: 11/11/22 Potential to Achieve Goals: Fair ADL Goals Pt Will Perform Lower Body Bathing: with min assist;sit to/from stand Pt Will Perform Lower Body Dressing: with min assist;sit to/from stand Pt Will Transfer to Toilet: with min assist;stand pivot transfer;bedside commode  Plan Discharge plan needs to be updated    Co-evaluation                 AM-PAC OT "6 Clicks" Daily Activity     Outcome Measure   Help from another person eating meals?: A Little Help from another person taking care of personal grooming?: A Little Help from another person toileting, which includes using toliet, bedpan, or urinal?: A Lot Help from another person bathing (including washing, rinsing, drying)?: A Lot Help from another person to put on and taking off regular upper body clothing?: A Little Help from  another person to put on and taking off regular lower body clothing?: Total 6 Click Score: 14    End of Session Equipment Utilized During Treatment: Oxygen;Rolling walker (2 wheels)  OT Visit Diagnosis: Muscle weakness (generalized) (M62.81);Other symptoms and signs involving cognitive function   Activity Tolerance Patient tolerated treatment well   Patient Left in chair;with call bell/phone within reach;with chair alarm set   Nurse Communication Mobility status        Time: [id number] OT Time Calculation (min): 27 min  Charges: OT General Charges $OT Visit: 1 Visit OT Treatments $Self Care/Home Management : 23-37 mins  Malachy Chamber, OTR/L Acute Rehab Services Office: [phone]   Layla Maw 10/29/2022, 2:20 PM

## 2022-10-30 ENCOUNTER — Inpatient Hospital Stay (HOSPITAL_COMMUNITY): Payer: Medicare HMO

## 2022-10-30 ENCOUNTER — Telehealth: Payer: Self-pay | Admitting: Cardiology

## 2022-10-30 DIAGNOSIS — R609 Edema, unspecified: Secondary | ICD-10-CM | POA: Diagnosis not present

## 2022-10-30 DIAGNOSIS — U071 COVID-19: Secondary | ICD-10-CM

## 2022-10-30 DIAGNOSIS — R0602 Shortness of breath: Secondary | ICD-10-CM

## 2022-10-30 LAB — BASIC METABOLIC PANEL
Anion gap: 10 (ref 5–15)
BUN: 23 mg/dL (ref 8–23)
CO2: 37 mmol/L — ABNORMAL HIGH (ref 22–32)
Calcium: 9.1 mg/dL (ref 8.9–10.3)
Chloride: 82 mmol/L — ABNORMAL LOW (ref 98–111)
Creatinine, Ser: 1.49 mg/dL — ABNORMAL HIGH (ref 0.44–1.00)
GFR, Estimated: 33 mL/min — ABNORMAL LOW (ref >60)
Glucose, Bld: 113 mg/dL — ABNORMAL HIGH (ref 70–99)
Potassium: 4.3 mmol/L (ref 3.5–5.1)
Sodium: 129 mmol/L — ABNORMAL LOW (ref 135–145)

## 2022-10-30 LAB — CBC WITH DIFFERENTIAL/PLATELET
Abs Immature Granulocytes: 0.01 10*3/uL (ref 0.00–0.07)
Basophils Absolute: 0 10*3/uL (ref 0.0–0.1)
Basophils Relative: 0 %
Eosinophils Absolute: 0 10*3/uL (ref 0.0–0.5)
Eosinophils Relative: 0 %
HCT: 34.2 % — ABNORMAL LOW (ref 36.0–46.0)
Hemoglobin: 11 g/dL — ABNORMAL LOW (ref 12.0–15.0)
Immature Granulocytes: 0 %
Lymphocytes Relative: 15 %
Lymphs Abs: 0.5 10*3/uL — ABNORMAL LOW (ref 0.7–4.0)
MCH: 27.1 pg (ref 26.0–34.0)
MCHC: 32.2 g/dL (ref 30.0–36.0)
MCV: 84.2 fL (ref 80.0–100.0)
Monocytes Absolute: 0.2 10*3/uL (ref 0.1–1.0)
Monocytes Relative: 5 %
Neutro Abs: 2.6 10*3/uL (ref 1.7–7.7)
Neutrophils Relative %: 80 %
Platelets: 87 10*3/uL — ABNORMAL LOW (ref 150–400)
RBC: 4.06 MIL/uL (ref 3.87–5.11)
RDW: 15.7 % — ABNORMAL HIGH (ref 11.5–15.5)
WBC: 3.2 10*3/uL — ABNORMAL LOW (ref 4.0–10.5)
nRBC: 0 % (ref 0.0–0.2)

## 2022-10-30 LAB — PROTIME-INR
INR: 2.4 — ABNORMAL HIGH (ref 0.8–1.2)
Prothrombin Time: 26.3 seconds — ABNORMAL HIGH (ref 11.4–15.2)

## 2022-10-30 LAB — MAGNESIUM: Magnesium: 1.8 mg/dL (ref 1.7–2.4)

## 2022-10-30 MED ORDER — WARFARIN SODIUM 1 MG PO TABS
1.0000 mg | ORAL_TABLET | Freq: Once | ORAL | Status: AC
  Administered 2022-10-30: 1 mg via ORAL
  Filled 2022-10-30: qty 1

## 2022-10-30 NOTE — Plan of Care (Signed)
  Problem: Coping: Goal: Psychosocial and spiritual needs will be supported Outcome: Progressing   Problem: Activity: Goal: Risk for activity intolerance will decrease Outcome: Progressing   Problem: Nutrition: Goal: Adequate nutrition will be maintained Outcome: Progressing   Problem: Coping: Goal: Level of anxiety will decrease Outcome: Progressing   Problem: Elimination: Goal: Will not experience complications related to bowel motility Outcome: Progressing Goal: Will not experience complications related to urinary retention Outcome: Progressing   Problem: Pain Managment: Goal: General experience of comfort will improve Outcome: Progressing   Problem: Skin Integrity: Goal: Risk for impaired skin integrity will decrease Outcome: Progressing

## 2022-10-30 NOTE — Progress Notes (Signed)
Bilaterally lower extremity venous study completed.   Preliminary results relayed to RN.  Please see CV Procedures for preliminary results.  Larence Thone, RVT  12:59 PM 10/30/22

## 2022-10-30 NOTE — Telephone Encounter (Signed)
Patient's daughter is requesting call back from Dr. Harrell Gave in regards to her mother being in hospital. She states she needs more clarification on what they are being told about some test done. Requesting results be viewed and then a call back.

## 2022-10-30 NOTE — Telephone Encounter (Signed)
Discussed Echo with patients daughter  Offered sooner appointment with Dr Harrell Gave for post hospital follow up, declined Scheduled to see Dr Harrell Gave 3/14 Advised daughter to call if anything changes and wants her seen sooner, verbalized understanding

## 2022-10-30 NOTE — Progress Notes (Signed)
[facility] Progress Note Patient: [name] [id number] DOB: [birth year] DOA: 10/23/2022  DOS: the patient was seen and examined on 10/30/2022  Brief hospital course: [name] is a 89 yr old female with PMH A-fib, CHF, dysphagia, HTN, GERD, history of PE, hypothyroidism who was brought to the hospital with worsening shortness of breath and edema.  Patient resides at home with her daughter who was noted to have COVID-19.  On testing after admission, patient was found to be positive for COVID and started on treatment. Assessment and Plan: COVID-19 infection Testing positive for COVID-19 on admission with new hypoxia Completed remdesivir x 3 days.  Continue course of Decadron Continue COVID precautions PCT remains negative  Hypoxia Due to COVID-19 / pulmonary edema as discussed above. Not on oxygen at home, wean as able   Acute on chronic diastolic CHF (congestive heart failure) ([place]) Unclear how much of CXR findings represent CHF vs COVID PNA BNP elevated; pulmonary edema, small pleural effusions; DOE; pulmonary crackles, B/L leg edema Continue bumex and Aldactone Echo updated: Severe AS new.  EF 60 to 65%, no RWMA, mild LVH.  RV moderately enlarged and elevated PA pressures which are previously known.  Severely dilated LA and RA.  Small pericardial effusion   Severe aortic stenosis Per echo read this appears new. Per report: "Apical doppler suggests severe AS though signal could be contaminated " Cardiology was consulted. Poor TAVR candidate but cardiology will continue to follow patient at discharge as well   Chronic kidney disease, stage 3b [facility]) patient has history of CKD3b. Baseline creat ~ 1.4, eGFR 33   Personal history of pulmonary embolism Looks like she takes coumadin chronically for this and for permanent a.fib continue coumadin per pharmacy   Permanent atrial fibrillation ([place]) Cont coumadin BB stopped recently due to hypotension.   Chronic thromboembolic  pulmonary hypertension ([facility]) PAH due to prior PEs.  Lower extremity Doppler negative for DVT.   Subjective:   Physical Exam: General: in Mild distress, No Rash Cardiovascular: S1 and S2 Present, No Murmur Respiratory: Good respiratory effort, Bilateral Air entry present.  Bilateral basal crackles, No wheezes Abdomen: Bowel Sound present, No tenderness Extremities: Improving bilateral edema, no evidence of toe infections Neuro: Alert and oriented x3, no new focal deficit  Data Reviewed: I have Reviewed nursing notes, Vitals, and Lab results. Since last encounter, pertinent lab results CBC and BMP   . I have ordered test including CBC and BMP  .   Disposition: Status is: Inpatient Remains inpatient appropriate because: Awaiting SNF placement   Family Communication: No one at bedside Level of care: Telemetry Medical Continue telemetry for CHF Vitals:   10/29/22 1511 10/29/22 2059 10/30/22 0602 10/30/22 0905  BP: (!) 151/75 (!) 141/57 (!) 150/62 (!) 147/66  Pulse: 78 80 83 80  Resp: 18 20  18  $ Temp: 98 F (36.7 C) 98 F (36.7 C) 98.4 F (36.9 C) 98 F (36.7 C)  TempSrc: Oral Oral Oral Oral  SpO2: 99% 95%  96%  Weight:      Height:         Author: Berle Mull, MD 10/30/2022 6:07 PM  Please look on [email or web address] to find out who is on call.

## 2022-10-30 NOTE — Progress Notes (Addendum)
ANTICOAGULATION CONSULT NOTE - Follow Up Consult  Pharmacy Consult for Warfarin Indication: atrial fibrillation and hx PE  Patient Measurements: Height: 5' 5"$  (165.1 cm) Weight: 79.8 kg (175 lb 14.8 oz) IBW/kg (Calculated) : 57  Vital Signs: Temp: 98 F (36.7 C) (02/22 0905) Temp Source: Oral (02/22 0905) BP: 147/66 (02/22 0905) Pulse Rate: 80 (02/22 0905)  Labs: Recent Labs    10/28/22 0244 10/29/22 0301 10/30/22 0311  HGB 10.4* 11.1* 11.0*  HCT 33.5* 35.0* 34.2*  PLT 86* 80* 87*  LABPROT 22.1* 25.1* 26.3*  INR 2.0* 2.3* 2.4*  CREATININE 1.39* 1.36* 1.49*     Estimated Creatinine Clearance: 25.1 mL/min (A) (by C-G formula based on SCr of 1.49 mg/dL (H)).  Assessment: 89 yr old female admitted for hypoxia in the setting of COVID and anasarca. Pharmacy consulted for continuation of PTA Warfarin for atrial fibrillation and hx PE.    PTA warfarin regimen: 2 mg MWF, 1 mg TTSS  INR was therapeutic (2.4) on admit, but up to 3.6 on 2/17 and warfarin dose held.  Later INR down to 2.5 on 2/18, thus warfarin resumed.   Today, INR 2.4 today.  Platelet count trended down 80 , up to 87 today.  No bleeding reported.  Bilateral LE duplex 10/30/22:  No evidence of DVT bilaterally.   Goal of Therapy:  INR 2-3 Monitor platelets by anticoagulation protocol: Yes     Plan:  Warfarin 1 mg x 1 today. Daily PT/INR and CBC. Monitor for signs/symptoms of bleeding.   [name], RPh Clinical Pharmacist 10/30/2022,1:54 PM

## 2022-10-30 NOTE — Progress Notes (Signed)
Mobility Specialist Progress Note   10/30/22 0940  Mobility  Activity Stood at bedside (STS, LE exericse)  Level of Assistance Moderate assist, patient does 50-74%  Assistive Device Front wheel walker  Range of Motion/Exercises Active;All extremities  Activity Response Tolerated well   Patient received in supine, pleasantly confused and agreeable to participate. Required multimodal cueing throuhgout session. Was min A for bed mobility and completed seated LE exercises with supervision. Patient then complete x2 STS with mod A and cues for hand/foot placement. Requested to lay back down secondary to being tired. Returned to supine with min-mod A and tolerated without complaint or incident. Was left in bed with all needs met, call bell in reach.   [name], BS EXP Mobility Specialist Please contact via SecureChat or Rehab office at [phone]

## 2022-10-30 NOTE — TOC Progression Note (Signed)
Transition of Care [facility]) - Initial/Assessment Note    Patient Details  Name: [name] MRN: [id number] Date of Birth: 1927/12/02  Transition of Care [facility]) CM/SW Contact:    Milinda Antis, [place] Phone Number: 10/30/2022, 8:57 AM  Clinical Narrative:                 LCSW contacted [facility] to inquire about the facility's ability to extend a bed offer and is awaiting a response.  TOC following.     Barriers to Discharge: Continued Medical Work up   Patient Goals and CMS Choice Patient states their goals for this hospitalization and ongoing recovery are:: To return home CMS Medicare.gov Compare Post Acute Care list provided to:: Patient Choice offered to / list presented to : Patient, Adult Children (Daughter, [name])      Expected Discharge Plan and Services   Discharge Planning Services: CM Consult   Living arrangements for the past 2 months: Single Family Home                                      Prior Living Arrangements/Services Living arrangements for the past 2 months: Single Family Home Lives with:: Adult Children (Daughter, [name]) Patient language and need for interpreter reviewed:: Yes Do you feel safe going back to the place where you live?: Yes      Need for Family Participation in Patient Care: Yes (Comment) Care giver support system in place?: Yes (comment)   Criminal Activity/Legal Involvement Pertinent to Current Situation/Hospitalization: No - Comment as needed  Activities of Daily Living Home Assistive Devices/Equipment: Walker (specify type) ADL Screening (condition at time of admission) Patient's cognitive ability adequate to safely complete daily activities?: Yes Is the patient deaf or have difficulty hearing?: No Does the patient have difficulty seeing, even when wearing glasses/contacts?: No Does the patient have difficulty concentrating, remembering, or making decisions?: Yes Patient able to express need for assistance with  ADLs?: Yes Does the patient have difficulty dressing or bathing?: Yes Independently performs ADLs?: No Communication: Independent Dressing (OT): Needs assistance Is this a change from baseline?: Change from baseline, expected to last >3 days Grooming: Needs assistance Is this a change from baseline?: Change from baseline, expected to last >3 days Feeding: Independent Bathing: Needs assistance Is this a change from baseline?: Change from baseline, expected to last >3 days Toileting: Needs assistance Is this a change from baseline?: Change from baseline, expected to last >3days In/Out Bed: Dependent Is this a change from baseline?: Change from baseline, expected to last >3 days Walks in Home: Needs assistance Is this a change from baseline?: Change from baseline, expected to last >3 days Does the patient have difficulty walking or climbing stairs?: Yes Weakness of Legs: Both Weakness of Arms/Hands: Both  Permission Sought/Granted Permission sought to share information with : Case Manager, Family Supports Permission granted to share information with : Yes, Verbal Permission Granted              Emotional Assessment Appearance:: Appears stated age Attitude/Demeanor/Rapport: Engaged, Gracious Affect (typically observed): Accepting, Appropriate, Calm, Hopeful, Pleasant Orientation: : Oriented to Self Alcohol / Substance Use: Not Applicable Psych Involvement: No (comment)  Admission diagnosis:  Anasarca [R60.1] Peripheral edema [R60.9] Acute respiratory failure with hypoxia ([place]) [J96.01] Pneumonia due to COVID-19 virus [U07.1, J12.82] COVID-19 [U07.1] Patient Active Problem List   Diagnosis Date Noted   Severe aortic stenosis 10/26/2022  Pneumonia due to COVID-19 virus 10/24/2022   COVID-19 10/23/2022   Hypoxia 10/23/2022   Positive blood culture 11/20/2021   Generalized weakness 11/20/2021   Thrombocytopenia (HCC) 11/17/2021   Anemia of chronic disease 11/17/2021   AKI  on CKD-3B 11/17/2021   Osteomyelitis ([place]) 11/16/2021   Acute on chronic diastolic CHF (congestive heart failure) ([place]) 11/16/2021   Hyponatremia 11/16/2021   Chronic kidney disease, stage 3b ([place]) 11/16/2021   Leukocytosis 11/16/2021   Uncontrolled hypertension 11/15/2021   Hyperthyroidism 11/15/2021   Peripheral venous insufficiency 11/15/2021   Personal history of pulmonary embolism 11/15/2021   Osteomyelitis of second toe of right foot ([place]) 11/15/2021   Cellulitis 11/15/2021   Chronic thromboembolic pulmonary hypertension ([place]) 05/12/2014   Permanent atrial fibrillation ([place]) 06/20/2009   Chronic obstructive lung disease ([place]) 04/04/2009   Gastroesophageal reflux disease 12/25/2008   Generalized anxiety disorder 12/25/2008   Hypercholesterolemia 12/25/2008   Hypothyroidism 12/25/2008   PCP:  Saintclair Halsted, FNP Pharmacy[facility]   [id number] [place] [id number]: [phone]: [phone]    Social Determinants of Health (SDOH) Social History: SDOH Screenings   Food Insecurity: Food Insecurity Present (10/27/2022)  Housing: Low Risk  (10/27/2022)  Transportation Needs: No Transportation Needs (10/27/2022)  Utilities: Not At Risk (10/27/2022)  Tobacco Use: Medium Risk (10/23/2022)   SDOH Interventions:     Readmission Risk Interventions    11/18/2021   12:55 PM  Readmission Risk Prevention Plan  Transportation Screening Complete  PCP or Specialist Appt within 3-5 Days Complete  HRI or [place] Village[place]  Social Work Consult for RTalentPlanning/Counseling Complete  Palliative Care Screening Complete  Medication Review (Press photographer Complete

## 2022-10-31 ENCOUNTER — Other Ambulatory Visit: Payer: Self-pay | Admitting: Cardiology

## 2022-10-31 DIAGNOSIS — U071 COVID-19: Secondary | ICD-10-CM | POA: Diagnosis not present

## 2022-10-31 DIAGNOSIS — I4821 Permanent atrial fibrillation: Secondary | ICD-10-CM

## 2022-10-31 LAB — BASIC METABOLIC PANEL
Anion gap: 10 (ref 5–15)
BUN: 23 mg/dL (ref 8–23)
CO2: 38 mmol/L — ABNORMAL HIGH (ref 22–32)
Calcium: 9 mg/dL (ref 8.9–10.3)
Chloride: 81 mmol/L — ABNORMAL LOW (ref 98–111)
Creatinine, Ser: 1.4 mg/dL — ABNORMAL HIGH (ref 0.44–1.00)
GFR, Estimated: 35 mL/min — ABNORMAL LOW (ref >60)
Glucose, Bld: 128 mg/dL — ABNORMAL HIGH (ref 70–99)
Potassium: 3.8 mmol/L (ref 3.5–5.1)
Sodium: 129 mmol/L — ABNORMAL LOW (ref 135–145)

## 2022-10-31 LAB — PROTIME-INR
INR: 2.7 — ABNORMAL HIGH (ref 0.8–1.2)
Prothrombin Time: 28.5 seconds — ABNORMAL HIGH (ref 11.4–15.2)

## 2022-10-31 MED ORDER — WARFARIN SODIUM 1 MG PO TABS
1.0000 mg | ORAL_TABLET | Freq: Once | ORAL | Status: AC
  Administered 2022-10-31: 1 mg via ORAL
  Filled 2022-10-31: qty 1

## 2022-10-31 MED ORDER — BISACODYL 10 MG RE SUPP
10.0000 mg | Freq: Once | RECTAL | Status: AC
  Administered 2022-10-31: 10 mg via RECTAL
  Filled 2022-10-31: qty 1

## 2022-10-31 NOTE — Progress Notes (Signed)
[facility] Progress Note Patient: [name] [id number] DOB: 02/19/1928 DOA: 10/23/2022  DOS: the patient was seen and examined on 10/31/2022  Brief hospital course: [name] is a 89 yr old female with PMH A-fib, CHF, dysphagia, HTN, GERD, history of PE, hypothyroidism who was brought to the hospital with worsening shortness of breath and edema.  Patient resides at home with her daughter who was noted to have COVID-19.  On testing after admission, patient was found to be positive for COVID and started on treatment. Assessment and Plan: COVID-19 infection Testing positive for COVID-19 on admission with new hypoxia Completed remdesivir x 3 days.  Continue course of Decadron Continue COVID precautions. PCT remains negative   Hypoxia Due to COVID-19 / pulmonary edema as discussed above. Not on oxygen at home, wean as able   Acute on chronic diastolic CHF (congestive heart failure) ([place]) Unclear how much of CXR findings represent CHF vs COVID PNA BNP elevated; pulmonary edema, small pleural effusions; DOE; pulmonary crackles, B/L leg edema Continue bumex and Aldactone Echo updated: Severe AS new.  EF 60 to 65%, no RWMA, mild LVH.  RV moderately enlarged and elevated PA pressures which are previously known.  Severely dilated LA and RA. Small pericardial effusion   Severe aortic stenosis Per echo read this appears new. Per report: "Apical doppler suggests severe AS though signal could be contaminated " Cardiology was consulted. Poor TAVR candidate but cardiology will continue to follow patient at discharge as well   Chronic kidney disease, stage 3b [facility]) patient has history of CKD3b. Baseline creat ~ 1.4, eGFR 33   Personal history of pulmonary embolism Looks like she takes coumadin chronically for this and for permanent a.fib continue coumadin per pharmacy   Permanent atrial fibrillation ([place]) Cont coumadin BB stopped recently due to hypotension.   Chronic thromboembolic  pulmonary hypertension (HCC) PAH due to prior PEs.  Lower extremity Doppler negative for DVT.  Constipation. Add Dulcolax suppository.  Subjective: No nausea vomiting or deformities.  Physical Exam: General: in Mild distress, No Rash Cardiovascular: S1 and S2 Present, No Murmur Respiratory: Good respiratory effort, Bilateral Air entry present. No Crackles, No wheezes Abdomen: Bowel Sound present, No tenderness Extremities: Bilateral edema Neuro: Alert and oriented x3, no new focal deficit  Data Reviewed: I have Reviewed nursing notes, Vitals, and Lab results. Since last encounter, pertinent lab results CBC and BMP   . I have ordered test including CBC and BMP  .   Disposition: Status is: Inpatient Remains inpatient appropriate because: Monitor for pneumonia.   Family Communication: No one at bedside.   Level of care: Telemetry Medical Continue telemetry for CHF Vitals:   10/31/22 0604 10/31/22 0812 10/31/22 1100 10/31/22 1908  BP: (!) 144/71 (!) 137/44  (!) 127/112  Pulse: 71 79  70  Resp: '18 18  17  '$ Temp: 98.3 F (36.8 C) (!) 97.4 F (36.3 C)  98.1 F (36.7 C)  TempSrc: Oral Oral  Oral  SpO2: 94% 98%  97%  Weight:   84.5 kg   Height:         Author: Berle Mull, MD 10/31/2022 7:16 PM  Please look on [email or web address] to find out who is on call.

## 2022-10-31 NOTE — Telephone Encounter (Signed)
Rx request sent to pharmacy.  

## 2022-10-31 NOTE — Progress Notes (Signed)
ANTICOAGULATION CONSULT NOTE - Follow Up Consult  Pharmacy Consult for Warfarin Indication: atrial fibrillation and hx PE  Patient Measurements: Height: '5\' 5"'$  (165.1 cm) Weight: 84.5 kg (186 lb 4.6 oz) IBW/kg (Calculated) : 57  Vital Signs: Temp: 97.4 F (36.3 C) (02/23 0812) Temp Source: Oral (02/23 0812) BP: 137/44 (02/23 0812) Pulse Rate: 79 (02/23 0812)  Labs: Recent Labs    10/29/22 0301 10/30/22 0311 10/31/22 0410  HGB 11.1* 11.0*  --   HCT 35.0* 34.2*  --   PLT 80* 87*  --   LABPROT 25.1* 26.3* 28.5*  INR 2.3* 2.4* 2.7*  CREATININE 1.36* 1.49* 1.40*     Estimated Creatinine Clearance: 27.5 mL/min (A) (by C-G formula based on SCr of 1.4 mg/dL (H)).  Assessment: 89 yr old female admitted for hypoxia in the setting of COVID and anasarca. Pharmacy consulted for continuation of PTA Warfarin for atrial fibrillation and hx PE.    PTA warfarin regimen: 2 mg MWF, 1 mg TTSS  INR was therapeutic (2.4) on admit, but up to 3.6 on 2/17 and warfarin dose held.  Later INR down to 2.5 on 2/18, thus warfarin resumed.   Today, INR 2.7.  Platelet count trended down 80 , up to 87 on 10/30/22.  No CBC today.  No bleeding reported.   Bilateral LE duplex done  10/30/22:  No evidence of DVT bilaterally.   Goal of Therapy:  INR 2-3 Monitor platelets by anticoagulation protocol: Yes     Plan:  Warfarin 1 mg x 1 today. Daily PT/INR and CBC. Monitor for signs/symptoms of bleeding.   Nicole Cella, RPh Clinical Pharmacist 10/31/2022,12:22 PM

## 2022-10-31 NOTE — Progress Notes (Signed)
Physical Therapy Treatment Patient Details Name: [name] MRN: [id number] DOB: 1928/02/25 Today's Date: 10/31/2022   History of Present Illness Pt is a 89 y/o female admitted for hypoxia in setting of COVID-19 infection and acute on chronic CHF. PMH: a fib, cHF, PE, R toe amputation, HTN, hiatal hernia, thyroid disease.    PT Comments    Pt with incremental progress towards acute goals this date. Pt needing increased encouragement throughout session for all mobility, as pt stating "I can't" when prompted for bed mobility and functional transfers however pt needing grossly less assist since eval and demonstrating good initiation with all mobility. Pt able to demonstrate transfers to standing with min guard assist ad RW support and step pivot transfers with min assist for RW management. Pt with low foot clearance throughout and fatiguing quickly with continued urinary incontinence in standing. Current plan remains appropriate to address deficits and maximize functional independence and decrease caregiver burden. Pt continues to benefit from skilled PT services to progress toward functional mobility goals.     Recommendations for follow up therapy are one component of a multi-disciplinary discharge planning process, led by the attending physician.  Recommendations may be updated based on patient status, additional functional criteria and insurance authorization.  Follow Up Recommendations  Skilled nursing-short term rehab (<3 hours/day) Can patient physically be transported by private vehicle: Yes   Assistance Recommended at Discharge Frequent or constant Supervision/Assistance  Patient can return home with the following A lot of help with walking and/or transfers;A lot of help with bathing/dressing/bathroom;Assistance with cooking/housework;Assistance with feeding;Help with stairs or ramp for entrance;Assist for transportation;Direct supervision/assist for medications management;Direct  supervision/assist for financial management   Equipment Recommendations  None recommended by PT    Recommendations for Other Services       Precautions / Restrictions Precautions Precautions: Fall Precaution Comments: monitor O2 (does not wear at baseline); urine incontinence Restrictions Weight Bearing Restrictions: No     Mobility  Bed Mobility Overal bed mobility: Needs Assistance Bed Mobility: Supine to Sit     Supine to sit: Mod assist     General bed mobility comments: mod a to elevate trunk na dbring BLEs to EOB    Transfers Overall transfer level: Needs assistance Equipment used: Rolling walker (2 wheels) Transfers: Sit to/from Stand, Bed to chair/wheelchair/BSC Sit to Stand: Min guard   Step pivot transfers: Min assist       General transfer comment: able to stand from EOB and BSC, min assist to step pivot from EOB>BSC>recliner with assist to steady RW and for encrouagement as pt repeating that she "cant do it"    Ambulation/Gait               General Gait Details: Deferred   Stairs             Wheelchair Mobility    Modified Rankin (Stroke Patients Only)       Balance Overall balance assessment: Needs assistance Sitting-balance support: Feet supported, No upper extremity supported Sitting balance-Leahy Scale: Fair     Standing balance support: Bilateral upper extremity supported, During functional activity, Reliant on assistive device for balance Standing balance-Leahy Scale: Poor                              Cognition Arousal/Alertness: Awake/alert Behavior During Therapy: WFL for tasks assessed/performed, Flat affect Overall Cognitive Status: Impaired/Different from baseline Area of Impairment: Orientation, Attention, Memory, Following commands, Safety/judgement, Awareness,  Problem solving                 Orientation Level: Disoriented to, Time, Situation Current Attention Level: Sustained Memory:  Decreased short-term memory Following Commands: Follows one step commands with increased time Safety/Judgement: Decreased awareness of safety, Decreased awareness of deficits Awareness: Intellectual Problem Solving: Slow processing, Requires verbal cues, Requires tactile cues General Comments: showing insight into place, Pt w/ decreased awareness of needs, current abilities and what assist needed. reports self as 89 y/o        Exercises      General Comments General comments (skin integrity, edema, etc.): Lunch tray set up for pt at end of session      Pertinent Vitals/Pain Pain Assessment Pain Assessment: Faces Faces Pain Scale: Hurts a little bit Pain Location: R knee Pain Descriptors / Indicators: Sore Pain Intervention(s): Monitored during session, Limited activity within patient's tolerance    Home Living                          Prior Function            PT Goals (current goals can now be found in the care plan section) Acute Rehab PT Goals Patient Stated Goal: None stated PT Goal Formulation: Patient unable to participate in goal setting Time For Goal Achievement: 11/11/22 Progress towards PT goals: Progressing toward goals    Frequency    Min 2X/week      PT Plan      Co-evaluation              AM-PAC PT "6 Clicks" Mobility   Outcome Measure  Help needed turning from your back to your side while in a flat bed without using bedrails?: A Little Help needed moving from lying on your back to sitting on the side of a flat bed without using bedrails?: A Lot Help needed moving to and from a bed to a chair (including a wheelchair)?: A Lot Help needed standing up from a chair using your arms (e.g., wheelchair or bedside chair)?: A Lot Help needed to walk in hospital room?: Total Help needed climbing 3-5 steps with a railing? : Total 6 Click Score: 11    End of Session Equipment Utilized During Treatment: Gait belt Activity Tolerance:  Patient limited by fatigue Patient left: in chair;with call bell/phone within reach;with chair alarm set Nurse Communication: Mobility status PT Visit Diagnosis: Unsteadiness on feet (R26.81);Pain Pain - part of body:  (L heel)     Time: [id number] PT Time Calculation (min) (ACUTE ONLY): 28 min  Charges:  $Therapeutic Activity: 23-37 mins                    Terik Haughey R. PTA Acute Rehabilitation Services Office: [place] 10/31/2022, 12:24 PM

## 2022-10-31 NOTE — TOC Progression Note (Signed)
Transition of Care [facility]) - Initial/Assessment Note    Patient Details  Name: [name] MRN: [id number] Date of Birth: [birth year]  [facility] CM/SW Contact:    Milinda Antis, [place] Phone Number: 10/31/2022, 11:17 AM  Clinical Narrative:                 11:15- LCSW contacted the patient's daughter to present bed offers. There was no answer.  LCSW left a VM requesting a returned call.    TOC following.     Barriers to Discharge: Continued Medical Work up   Patient Goals and CMS Choice Patient states their goals for this hospitalization and ongoing recovery are:: To return home CMS Medicare.gov Compare Post Acute Care list provided to:: Patient Choice offered to / list presented to : Patient, Adult Children (Daughter, [name])      Expected Discharge Plan and Services   Discharge Planning Services: CM Consult   Living arrangements for the past 2 months: Single Family Home                                      Prior Living Arrangements/Services Living arrangements for the past 2 months: Single Family Home Lives with:: Adult Children (Daughter, [name]) Patient language and need for interpreter reviewed:: Yes Do you feel safe going back to the place where you live?: Yes      Need for Family Participation in Patient Care: Yes (Comment) Care giver support system in place?: Yes (comment)   Criminal Activity/Legal Involvement Pertinent to Current Situation/Hospitalization: No - Comment as needed  Activities of Daily Living Home Assistive Devices/Equipment: Walker (specify type) ADL Screening (condition at time of admission) Patient's cognitive ability adequate to safely complete daily activities?: Yes Is the patient deaf or have difficulty hearing?: No Does the patient have difficulty seeing, even when wearing glasses/contacts?: No Does the patient have difficulty concentrating, remembering, or making decisions?: Yes Patient able to express need  for assistance with ADLs?: Yes Does the patient have difficulty dressing or bathing?: Yes Independently performs ADLs?: No Communication: Independent Dressing (OT): Needs assistance Is this a change from baseline?: Change from baseline, expected to last >3 days Grooming: Needs assistance Is this a change from baseline?: Change from baseline, expected to last >3 days Feeding: Independent Bathing: Needs assistance Is this a change from baseline?: Change from baseline, expected to last >3 days Toileting: Needs assistance Is this a change from baseline?: Change from baseline, expected to last >3days In/Out Bed: Dependent Is this a change from baseline?: Change from baseline, expected to last >3 days Walks in Home: Needs assistance Is this a change from baseline?: Change from baseline, expected to last >3 days Does the patient have difficulty walking or climbing stairs?: Yes Weakness of Legs: Both Weakness of Arms/Hands: Both  Permission Sought/Granted Permission sought to share information with : Case Manager, Family Supports Permission granted to share information with : Yes, Verbal Permission Granted              Emotional Assessment Appearance:: Appears stated age Attitude/Demeanor/Rapport: Engaged, Gracious Affect (typically observed): Accepting, Appropriate, Calm, Hopeful, Pleasant Orientation: : Oriented to Self Alcohol / Substance Use: Not Applicable Psych Involvement: No (comment)  Admission diagnosis:  Anasarca [R60.1] Peripheral edema [R60.9] Acute respiratory failure with hypoxia (HCC) [J96.01] Pneumonia due to COVID-19 virus [U07.1, J12.82] COVID-19 [U07.1] Patient Active Problem List   Diagnosis Date Noted  Severe aortic stenosis 10/26/2022   Pneumonia due to COVID-19 virus 10/24/2022   COVID-19 10/23/2022   Hypoxia 10/23/2022   Positive blood culture 11/20/2021   Generalized weakness 11/20/2021   Thrombocytopenia (HCC) 11/17/2021   Anemia of chronic  disease 11/17/2021   AKI on CKD-3B 11/17/2021   Osteomyelitis ([place]) 11/16/2021   Acute on chronic diastolic CHF (congestive heart failure) ([place]) 11/16/2021   Hyponatremia 11/16/2021   Chronic kidney disease, stage 3b ([place]) 11/16/2021   Leukocytosis 11/16/2021   Uncontrolled hypertension 11/15/2021   Hyperthyroidism 11/15/2021   Peripheral venous insufficiency 11/15/2021   Personal history of pulmonary embolism 11/15/2021   Osteomyelitis of second toe of right foot ([place]) 11/15/2021   Cellulitis 11/15/2021   Chronic thromboembolic pulmonary hypertension ([place]) 05/12/2014   Permanent atrial fibrillation ([place]) 06/20/2009   Chronic obstructive lung disease ([place]) 04/04/2009   Gastroesophageal reflux disease 12/25/2008   Generalized anxiety disorder 12/25/2008   Hypercholesterolemia 12/25/2008   Hypothyroidism 12/25/2008   PCP:  Saintclair Halsted, FNP [id number] [place]Phone: [phone]: [phone]    Social Determinants of Health (SDOH) Social History: SDOH Screenings   Food Insecurity: Food Insecurity Present (10/27/2022)  Housing: Low Risk  (10/27/2022)  Transportation Needs: No Transportation Needs (10/27/2022)  Utilities: Not At Risk (10/27/2022)  Tobacco Use: Medium Risk (10/23/2022)   SDOH Interventions:     Readmission Risk Interventions    11/18/2021   12:55 PM  Readmission Risk Prevention Plan  Transportation Screening Complete  PCP or Specialist Appt within 3-5 Days Complete  HRI or [place]Complete  Social Work Consult for [place]oganPlanning/Counseling Complete  Palliative Care Screening Complete  Medication Review (Press photographer Complete

## 2022-11-01 ENCOUNTER — Inpatient Hospital Stay (HOSPITAL_COMMUNITY): Payer: Medicare HMO

## 2022-11-01 DIAGNOSIS — U071 COVID-19: Secondary | ICD-10-CM | POA: Diagnosis not present

## 2022-11-01 LAB — BASIC METABOLIC PANEL
Anion gap: 10 (ref 5–15)
Anion gap: 13 (ref 5–15)
BUN: 18 mg/dL (ref 8–23)
BUN: 21 mg/dL (ref 8–23)
CO2: 38 mmol/L — ABNORMAL HIGH (ref 22–32)
CO2: 35 mmol/L — ABNORMAL HIGH (ref 22–32)
Calcium: 8.6 mg/dL — ABNORMAL LOW (ref 8.9–10.3)
Calcium: 8.9 mg/dL (ref 8.9–10.3)
Chloride: 78 mmol/L — ABNORMAL LOW (ref 98–111)
Chloride: 79 mmol/L — ABNORMAL LOW (ref 98–111)
Creatinine, Ser: 1.25 mg/dL — ABNORMAL HIGH (ref 0.44–1.00)
Creatinine, Ser: 1.2 mg/dL — ABNORMAL HIGH (ref 0.44–1.00)
GFR, Estimated: 40 mL/min — ABNORMAL LOW (ref >60)
GFR, Estimated: 42 mL/min — ABNORMAL LOW (ref >60)
Glucose, Bld: 117 mg/dL — ABNORMAL HIGH (ref 70–99)
Glucose, Bld: 133 mg/dL — ABNORMAL HIGH (ref 70–99)
Potassium: 4 mmol/L (ref 3.5–5.1)
Potassium: 3.5 mmol/L (ref 3.5–5.1)
Sodium: 126 mmol/L — ABNORMAL LOW (ref 135–145)
Sodium: 127 mmol/L — ABNORMAL LOW (ref 135–145)

## 2022-11-01 LAB — CBC WITH DIFFERENTIAL/PLATELET
Abs Immature Granulocytes: 0.02 10*3/uL (ref 0.00–0.07)
Basophils Absolute: 0 10*3/uL (ref 0.0–0.1)
Basophils Relative: 0 %
Eosinophils Absolute: 0 10*3/uL (ref 0.0–0.5)
Eosinophils Relative: 1 %
HCT: 35.4 % — ABNORMAL LOW (ref 36.0–46.0)
Hemoglobin: 11.2 g/dL — ABNORMAL LOW (ref 12.0–15.0)
Immature Granulocytes: 1 %
Lymphocytes Relative: 15 %
Lymphs Abs: 0.7 10*3/uL (ref 0.7–4.0)
MCH: 26.9 pg (ref 26.0–34.0)
MCHC: 31.6 g/dL (ref 30.0–36.0)
MCV: 85.1 fL (ref 80.0–100.0)
Monocytes Absolute: 0.2 10*3/uL (ref 0.1–1.0)
Monocytes Relative: 5 %
Neutro Abs: 3.5 10*3/uL (ref 1.7–7.7)
Neutrophils Relative %: 78 %
Platelets: 95 10*3/uL — ABNORMAL LOW (ref 150–400)
RBC: 4.16 MIL/uL (ref 3.87–5.11)
RDW: 15.7 % — ABNORMAL HIGH (ref 11.5–15.5)
WBC: 4.4 10*3/uL (ref 4.0–10.5)
nRBC: 0 % (ref 0.0–0.2)

## 2022-11-01 LAB — PROTIME-INR
INR: 2.5 — ABNORMAL HIGH (ref 0.8–1.2)
Prothrombin Time: 27.1 seconds — ABNORMAL HIGH (ref 11.4–15.2)

## 2022-11-01 LAB — BRAIN NATRIURETIC PEPTIDE: B Natriuretic Peptide: 329.2 pg/mL — ABNORMAL HIGH (ref 0.0–100.0)

## 2022-11-01 LAB — OSMOLALITY: Osmolality: 278 mOsm/kg (ref 275–295)

## 2022-11-01 MED ORDER — ENSURE ENLIVE PO LIQD
237.0000 mL | Freq: Three times a day (TID) | ORAL | Status: DC
  Administered 2022-11-01 – 2022-11-07 (×18): 237 mL via ORAL

## 2022-11-01 MED ORDER — WARFARIN SODIUM 1 MG PO TABS
1.0000 mg | ORAL_TABLET | ORAL | Status: DC
  Administered 2022-11-01 – 2022-11-02 (×2): 1 mg via ORAL
  Filled 2022-11-01 (×2): qty 1

## 2022-11-01 MED ORDER — WARFARIN SODIUM 2 MG PO TABS: 2.0000 mg | ORAL_TABLET | ORAL | Status: DC

## 2022-11-01 MED ORDER — BUMETANIDE 2 MG PO TABS
3.0000 mg | ORAL_TABLET | Freq: Two times a day (BID) | ORAL | Status: DC
  Administered 2022-11-01 – 2022-11-03 (×4): 3 mg via ORAL
  Filled 2022-11-01 (×4): qty 1

## 2022-11-01 MED ORDER — SPIRONOLACTONE 25 MG PO TABS
25.0000 mg | ORAL_TABLET | Freq: Every day | ORAL | Status: DC
  Administered 2022-11-02 – 2022-11-05 (×4): 25 mg via ORAL
  Filled 2022-11-01 (×4): qty 1

## 2022-11-01 NOTE — Plan of Care (Signed)
  Problem: Coping: Goal: Psychosocial and spiritual needs will be supported Outcome: Not Progressing   Problem: Respiratory: Goal: Will maintain a patent airway Outcome: Not Progressing   Problem: Health Behavior/Discharge Planning: Goal: Ability to manage health-related needs will improve Outcome: Not Progressing   Problem: Clinical Measurements: Goal: Ability to maintain clinical measurements within normal limits will improve Outcome: Not Progressing Goal: Will remain free from infection Outcome: Not Progressing Goal: Diagnostic test results will improve Outcome: Not Progressing Goal: Respiratory complications will improve Outcome: Not Progressing Goal: Cardiovascular complication will be avoided Outcome: Not Progressing   Problem: Activity: Goal: Risk for activity intolerance will decrease Outcome: Not Progressing   Problem: Nutrition: Goal: Adequate nutrition will be maintained Outcome: Not Progressing   Problem: Coping: Goal: Level of anxiety will decrease Outcome: Not Progressing   Problem: Elimination: Goal: Will not experience complications related to bowel motility Outcome: Not Progressing Goal: Will not experience complications related to urinary retention Outcome: Not Progressing   Problem: Pain Managment: Goal: General experience of comfort will improve Outcome: Not Progressing   Problem: Safety: Goal: Ability to remain free from injury will improve Outcome: Not Progressing   Problem: Skin Integrity: Goal: Risk for impaired skin integrity will decrease Outcome: Not Progressing

## 2022-11-01 NOTE — Progress Notes (Signed)
Dentures cleaned per daughters request. Pt insisted on putting a paper towel under lower dentures to hold it in place. Pt states she puts any type of paper she can find to put in to hold them in place. Daughter did tell this RN this as well.   [name]

## 2022-11-01 NOTE — Progress Notes (Signed)
[facility] Progress Note Patient: [name] [id number] DOB: June 30, 1928 DOA: 10/23/2022  DOS: the patient was seen and examined on 11/01/2022  Brief hospital course: Ms. Birch is a 89 yr old female with PMH A-fib, CHF, dysphagia, HTN, GERD, history of PE, hypothyroidism who was brought to the hospital with worsening shortness of breath and edema.  Patient resides at home with her daughter who was noted to have COVID-19.  On testing after admission, patient was found to be positive for COVID and started on treatment. Assessment and Plan: COVID-19 infection Testing positive for COVID-19 on admission with new hypoxia Completed remdesivir x 3 days.  Continue course of Decadron Continue COVID precautions. PCT remains negative   Hypoxia Due to COVID-19 / pulmonary edema as discussed above. Not on oxygen at home, wean as able   Acute on chronic diastolic CHF (congestive heart failure) ([place]) Unclear how much of CXR findings represent CHF vs COVID PNA BNP elevated; pulmonary edema, small pleural effusions; DOE; pulmonary crackles, B/L leg edema Continue bumex and Aldactone Echo updated: Severe AS new.  EF 60 to 65%, no RWMA, mild LVH.  RV moderately enlarged and elevated PA pressures which are previously known.  Severely dilated LA and RA. Small pericardial effusion BNP appears to be improving.  Will increase Bumex dose.   Severe aortic stenosis Per echo read this appears new. Per report: "Apical doppler suggests severe AS though signal could be contaminated " Cardiology was consulted. Poor TAVR candidate but cardiology will continue to follow patient at discharge as well   Chronic kidney disease, stage 3b [facility]) patient has history of CKD3b. Baseline creat ~ 1.4, eGFR 33 Renal function stable.  Hyponatremia. Related to steroid use as well as volume overload. Monitor for now.  Add Ensure.   Personal history of pulmonary embolism Looks like she takes coumadin chronically for this  and for permanent a.fib continue coumadin per pharmacy   Permanent atrial fibrillation ([place]) Cont coumadin BB stopped recently due to hypotension.   Chronic thromboembolic pulmonary hypertension (HCC) PAH due to prior PEs.  Lower extremity Doppler negative for DVT.  Constipation. Add Dulcolax suppository.  Subjective: No acute complaint.  No nausea no vomiting no fever no chills.  Physical Exam: Clear to auscultation. S1-S2 present.  Aortic systolic murmur. Bilateral basal crackles. Bilateral lower extremity edema improving.  Data Reviewed: I have Reviewed nursing notes, Vitals, and Lab results. Since last encounter, pertinent lab results CBC and BMP   . I have ordered test including CBC and BMP  .    Disposition: Status is: Inpatient Remains inpatient appropriate because: Monitor for pneumonia. warfarin (COUMADIN) tablet 1 mg  warfarin (COUMADIN) tablet 2 mg   Family Communication: No one at bedside.   Level of care: Telemetry Medical Continue telemetry for CHF Vitals:   10/31/22 2027 10/31/22 2032 11/01/22 0618 11/01/22 0856  BP: (!) 115/58  127/61 130/66  Pulse: 81  80 78  Resp:   18 18  Temp: (!) 97.5 F (36.4 C)  98 F (36.7 C) 98 F (36.7 C)  TempSrc: Oral  Oral Oral  SpO2: (!) 89% 96% 98% 98%  Weight:      Height:         Author: Berle Mull, MD 11/01/2022 6:12 PM  Please look on [email or web address] to find out who is on call.

## 2022-11-01 NOTE — Progress Notes (Signed)
Mobility Specialist - Progress Note   11/01/22 1216  Mobility  Activity Transferred to/from [facility]  Level of Assistance +2 (takes two people)  Information systems manager Ambulated (ft) 3 ft  Activity Response Tolerated fair  Mobility Referral Yes  $Mobility charge 1 Mobility   Pt was received in chair and neeing assistance to [facility]. Pt was ModA +2 for transfer. Pt had x1 urine incontinence during transfer. Pt with successful BM. Pt was returned to chair with all needs met and RN present.   [name]  Mobility Specialist Please contact via Solicitor or Rehab office at [phone]

## 2022-11-01 NOTE — Progress Notes (Signed)
ANTICOAGULATION CONSULT NOTE - Follow Up Consult  Pharmacy Consult for Warfarin Indication: atrial fibrillation and hx PE  Patient Measurements: Height: '5\' 5"'$  (165.1 cm) Weight: 84.5 kg (186 lb 4.6 oz) IBW/kg (Calculated) : 57  Vital Signs: Temp: 98 F (36.7 C) (02/24 0856) Temp Source: Oral (02/24 0856) BP: 130/66 (02/24 0856) Pulse Rate: 78 (02/24 0856)  Labs: Recent Labs    10/30/22 0311 10/31/22 0410 11/01/22 0720  HGB 11.0*  --  11.2*  HCT 34.2*  --  35.4*  PLT 87*  --  95*  LABPROT 26.3* 28.5* 27.1*  INR 2.4* 2.7* 2.5*  CREATININE 1.49* 1.40* 1.25*     Estimated Creatinine Clearance: 30.8 mL/min (A) (by C-G formula based on SCr of 1.25 mg/dL (H)).  Assessment: 89 yr old female admitted for hypoxia in the setting of COVID and anasarca. Pharmacy consulted for continuation of PTA Warfarin for atrial fibrillation and hx PE.    PTA warfarin regimen: 2 mg MWF, 1 mg TTSS  INR was therapeutic (2.4) on admit, but up to 3.6 on 2/17 and warfarin dose held.  Later INR down to 2.5 on 2/18, thus warfarin resumed.   Today ,2/24 INR = 2.5, remains therapeutic.  - Hgb 10-11s stable,  chronic thrombocytopenia : PLTC 95 improved  No bleeding reported.  SCr down to 12.5  Bilateral LE duplex done  10/30/22:  No evidence of DVT bilaterally. INR remains therapeutic ,  I will resume Goal of Therapy:  INR 2-3 Monitor platelets by anticoagulation protocol: Yes     Plan:  Warfarin resume PTA dose of  '1mg'$  qTTSS and '2mg'$  qMWF Change PT/INR check to qMWF Monitor for signs/symptoms of bleeding.   Nicole Cella, RPh Clinical Pharmacist 11/01/2022,10:00 AM

## 2022-11-02 DIAGNOSIS — U071 COVID-19: Secondary | ICD-10-CM | POA: Diagnosis not present

## 2022-11-02 LAB — BASIC METABOLIC PANEL
Anion gap: 10 (ref 5–15)
Anion gap: 10 (ref 5–15)
BUN: 18 mg/dL (ref 8–23)
BUN: 34 mg/dL — ABNORMAL HIGH (ref 8–23)
CO2: 37 mmol/L — ABNORMAL HIGH (ref 22–32)
CO2: 38 mmol/L — ABNORMAL HIGH (ref 22–32)
Calcium: 8.7 mg/dL — ABNORMAL LOW (ref 8.9–10.3)
Calcium: 9 mg/dL (ref 8.9–10.3)
Chloride: 80 mmol/L — ABNORMAL LOW (ref 98–111)
Chloride: 80 mmol/L — ABNORMAL LOW (ref 98–111)
Creatinine, Ser: 1.23 mg/dL — ABNORMAL HIGH (ref 0.44–1.00)
Creatinine, Ser: 1.23 mg/dL — ABNORMAL HIGH (ref 0.44–1.00)
GFR, Estimated: 41 mL/min — ABNORMAL LOW (ref >60)
GFR, Estimated: 41 mL/min — ABNORMAL LOW (ref >60)
Glucose, Bld: 118 mg/dL — ABNORMAL HIGH (ref 70–99)
Glucose, Bld: 128 mg/dL — ABNORMAL HIGH (ref 70–99)
Potassium: 3.8 mmol/L (ref 3.5–5.1)
Potassium: 3.7 mmol/L (ref 3.5–5.1)
Sodium: 127 mmol/L — ABNORMAL LOW (ref 135–145)
Sodium: 128 mmol/L — ABNORMAL LOW (ref 135–145)

## 2022-11-02 MED ORDER — UREA 15 G PO PACK
15.0000 g | PACK | Freq: Two times a day (BID) | ORAL | Status: DC
  Administered 2022-11-02 – 2022-11-03 (×3): 15 g via ORAL
  Filled 2022-11-02 (×3): qty 1

## 2022-11-02 NOTE — Plan of Care (Signed)
  Problem: Coping: Goal: Psychosocial and spiritual needs will be supported Outcome: Not Progressing   Problem: Respiratory: Goal: Will maintain a patent airway Outcome: Not Progressing   Problem: Health Behavior/Discharge Planning: Goal: Ability to manage health-related needs will improve Outcome: Not Progressing   Problem: Clinical Measurements: Goal: Ability to maintain clinical measurements within normal limits will improve Outcome: Not Progressing Goal: Will remain free from infection Outcome: Not Progressing Goal: Diagnostic test results will improve Outcome: Not Progressing Goal: Respiratory complications will improve Outcome: Not Progressing Goal: Cardiovascular complication will be avoided Outcome: Not Progressing   Problem: Activity: Goal: Risk for activity intolerance will decrease Outcome: Not Progressing   Problem: Nutrition: Goal: Adequate nutrition will be maintained Outcome: Not Progressing   Problem: Coping: Goal: Level of anxiety will decrease Outcome: Not Progressing   Problem: Elimination: Goal: Will not experience complications related to bowel motility Outcome: Not Progressing Goal: Will not experience complications related to urinary retention Outcome: Not Progressing   Problem: Pain Managment: Goal: General experience of comfort will improve Outcome: Not Progressing   Problem: Safety: Goal: Ability to remain free from injury will improve Outcome: Not Progressing   Problem: Skin Integrity: Goal: Risk for impaired skin integrity will decrease Outcome: Not Progressing

## 2022-11-02 NOTE — Progress Notes (Signed)
[facility] Progress Note Patient: [name] [id number] DOB: [birth year] DOA: 10/23/2022  DOS: the patient was seen and examined on 11/02/2022  Brief hospital course: [name] is a 89 yr old female with PMH A-fib, CHF, dysphagia, HTN, GERD, history of PE, hypothyroidism who was brought to the hospital with worsening shortness of breath and edema.  Patient resides at home with her daughter who was noted to have COVID-19.  On testing after admission, patient was found to be positive for COVID and started on treatment. Assessment and Plan: COVID-19 infection Acute hypoxic respiratory failure. Testing positive for COVID-19 on admission with new hypoxia Completed remdesivir x 3 days.  Completing course of Decadron Continue COVID precautions.  Out of isolation on 2/27. Not on any oxygen at baseline.  Currently on 2 LPM.  Saturating 89% on room air on 2/25. PCT remains negative   Acute on chronic diastolic CHF (congestive heart failure) ([place]) Unclear how much of CXR findings represent CHF vs COVID PNA BNP elevated; pulmonary edema, small pleural effusions; DOE; pulmonary crackles, B/L leg edema Continue bumex and Aldactone Echo updated: Severe AS new.  EF 60 to 65%, no RWMA, mild LVH.  RV moderately enlarged and elevated PA pressures which are previously known.  Severely dilated LA and RA. Small pericardial effusion BNP appears to be improving. Bumex dose increased.  Monitor renal function.   Severe aortic stenosis Per echo read this appears new. Per report: "Apical doppler suggests severe AS though signal could be contaminated " Cardiology was consulted. Poor TAVR candidate but cardiology will continue to follow patient at discharge as well   Chronic kidney disease, stage 3b [facility]) Patient has history of CKD3b. Baseline creat ~ 1.4, eGFR 33 Renal function stable.   Hyponatremia. Related to steroid use as well as volume overload. Monitor for now.  Add Ensure.  Add urea packet.    Personal history of pulmonary embolism Looks like she takes coumadin chronically for this and for permanent a.fib continue coumadin per pharmacy   Permanent atrial fibrillation ([place]) Cont coumadin BB stopped recently due to hypotension.   Chronic thromboembolic pulmonary hypertension (HCC) PAH due to prior PEs.  Lower extremity Doppler negative for DVT.   Constipation. Add Dulcolax suppository.   Subjective: No nausea no vomiting.  No abdominal pain.  Feels constipated.  Had 3 bowel movements yesterday per RN.  Physical Exam: General: in moderate distress, No Rash Cardiovascular: S1 and S2 Present, aortic systolic murmur Respiratory: Good respiratory effort, Bilateral Air entry present. No Crackles, No wheezes Abdomen: Bowel Sound present, No tenderness Extremities: Trace edema Neuro: Alert and oriented x3, no new focal deficit  Data Reviewed: I have Reviewed nursing notes, Vitals, and Lab results. Since last encounter, pertinent lab results CBC and BMP   . I have ordered test including CBC and BMP  .   Disposition: Status is: Inpatient Remains inpatient appropriate because: Need to stabilize sodium levels. warfarin (COUMADIN) tablet 1 mg  warfarin (COUMADIN) tablet 2 mg   Family Communication: No one at bedside Level of care: Telemetry Medical Continue telemetry for now due to CHF Vitals:   11/02/22 0905 11/02/22 1300 11/02/22 1330 11/02/22 1600  BP:    114/62  Pulse:    82  Resp:    16  Temp:    97.8 F (36.6 C)  TempSrc:    Oral  SpO2: 95% (!) 89% 97% 98%  Weight:      Height:         Author:  Berle Mull, MD 11/02/2022 4:42 PM  Please look on [email or web address] to find out who is on call.

## 2022-11-03 ENCOUNTER — Inpatient Hospital Stay (HOSPITAL_COMMUNITY): Payer: Medicare HMO

## 2022-11-03 DIAGNOSIS — U071 COVID-19: Secondary | ICD-10-CM | POA: Diagnosis not present

## 2022-11-03 LAB — BASIC METABOLIC PANEL
Anion gap: 8 (ref 5–15)
BUN: 43 mg/dL — ABNORMAL HIGH (ref 8–23)
CO2: 40 mmol/L — ABNORMAL HIGH (ref 22–32)
Calcium: 9 mg/dL (ref 8.9–10.3)
Chloride: 79 mmol/L — ABNORMAL LOW (ref 98–111)
Creatinine, Ser: 1.26 mg/dL — ABNORMAL HIGH (ref 0.44–1.00)
GFR, Estimated: 40 mL/min — ABNORMAL LOW (ref >60)
Glucose, Bld: 98 mg/dL (ref 70–99)
Potassium: 4 mmol/L (ref 3.5–5.1)
Sodium: 127 mmol/L — ABNORMAL LOW (ref 135–145)

## 2022-11-03 LAB — PROTIME-INR
INR: 1.6 — ABNORMAL HIGH (ref 0.8–1.2)
Prothrombin Time: 19.2 seconds — ABNORMAL HIGH (ref 11.4–15.2)

## 2022-11-03 LAB — CBC
HCT: 35.6 % — ABNORMAL LOW (ref 36.0–46.0)
Hemoglobin: 11.3 g/dL — ABNORMAL LOW (ref 12.0–15.0)
MCH: 27.2 pg (ref 26.0–34.0)
MCHC: 31.7 g/dL (ref 30.0–36.0)
MCV: 85.8 fL (ref 80.0–100.0)
Platelets: 109 10*3/uL — ABNORMAL LOW (ref 150–400)
RBC: 4.15 MIL/uL (ref 3.87–5.11)
RDW: 16 % — ABNORMAL HIGH (ref 11.5–15.5)
WBC: 5.4 10*3/uL (ref 4.0–10.5)
nRBC: 0 % (ref 0.0–0.2)

## 2022-11-03 MED ORDER — DICLOFENAC SODIUM 1 % EX GEL
4.0000 g | Freq: Four times a day (QID) | CUTANEOUS | Status: DC
  Administered 2022-11-03 – 2022-11-07 (×15): 4 g via TOPICAL
  Filled 2022-11-03: qty 100

## 2022-11-03 MED ORDER — WARFARIN SODIUM 3 MG PO TABS
3.0000 mg | ORAL_TABLET | Freq: Once | ORAL | Status: AC
  Administered 2022-11-03: 3 mg via ORAL
  Filled 2022-11-03: qty 1

## 2022-11-03 MED ORDER — LIDOCAINE 5 % EX PTCH
1.0000 | MEDICATED_PATCH | Freq: Every day | CUTANEOUS | Status: DC
  Administered 2022-11-04 – 2022-11-06 (×4): 1 via TRANSDERMAL
  Filled 2022-11-03 (×4): qty 1

## 2022-11-03 MED ORDER — BUMETANIDE 2 MG PO TABS
2.0000 mg | ORAL_TABLET | Freq: Two times a day (BID) | ORAL | Status: DC
  Administered 2022-11-03 – 2022-11-05 (×5): 2 mg via ORAL
  Filled 2022-11-03 (×6): qty 1

## 2022-11-03 NOTE — Progress Notes (Signed)
Physical Therapy Treatment Patient Details Name: SKYLAR MAROLDA MRN: [id number] DOB: 20-Oct-1927 Today's Date: 11/03/2022   History of Present Illness Pt is a 89 y/o female admitted for hypoxia in setting of COVID-19 infection and acute on chronic CHF. PMH: a fib, cHF, PE, R toe amputation, HTN, hiatal hernia, thyroid disease.    PT Comments    Pt with slow but steady progress towards acute goals, however pt continues to be limited by impaired cognition, decreased activity tolerance and impaired balance/postural reactions. Pt continues to sate often throughout session that she is unable to physical complete task (ie; bed mobility, standing) however with max encouragement to try pt able to complete with minimal assist. Pt able to complete bed mobility with up to min assist to manage Les this date and step pivot EOB<>BSC with min assist to manage RW and for cues for sequencing. Current plan remains appropriate to address deficits and maximize functional independence and decrease caregiver burden. Pt continues to benefit from skilled PT services to progress toward functional mobility goals.     Recommendations for follow up therapy are one component of a multi-disciplinary discharge planning process, led by the attending physician.  Recommendations may be updated based on patient status, additional functional criteria and insurance authorization.  Follow Up Recommendations  Skilled nursing-short term rehab (<3 hours/day) Can patient physically be transported by private vehicle: Yes   Assistance Recommended at Discharge Frequent or constant Supervision/Assistance  Patient can return home with the following A lot of help with walking and/or transfers;A lot of help with bathing/dressing/bathroom;Assistance with cooking/housework;Assistance with feeding;Help with stairs or ramp for entrance;Assist for transportation;Direct supervision/assist for medications management;Direct supervision/assist for financial  management   Equipment Recommendations  None recommended by PT    Recommendations for Other Services       Precautions / Restrictions Precautions Precautions: Fall Precaution Comments: monitor O2 (does not wear at baseline); urine incontinence Restrictions Weight Bearing Restrictions: No     Mobility  Bed Mobility Overal bed mobility: Needs Assistance Bed Mobility: Supine to Sit, Sit to Supine     Supine to sit: Min assist Sit to supine: Min assist   General bed mobility comments: light min a to elevate trunk and bring BLEs to EOB, pt able to bring LEs back into bed    Transfers Overall transfer level: Needs assistance Equipment used: Rolling walker (2 wheels) Transfers: Sit to/from Stand, Bed to chair/wheelchair/BSC Sit to Stand: Min guard   Step pivot transfers: Mod assist       General transfer comment: able to stand from EOB and BSC, min assist to step pivot from EOB<>BSC to manage RW as pt with anterior flexed trunk resting R forearm on RW despite cues    Ambulation/Gait               General Gait Details: Deferred   Stairs             Wheelchair Mobility    Modified Rankin (Stroke Patients Only)       Balance Overall balance assessment: Needs assistance Sitting-balance support: Feet supported, No upper extremity supported Sitting balance-Leahy Scale: Fair     Standing balance support: Bilateral upper extremity supported, During functional activity, Reliant on assistive device for balance Standing balance-Leahy Scale: Poor                              Cognition Arousal/Alertness: Awake/alert Behavior During Therapy: WFL for tasks assessed/performed,  Flat affect Overall Cognitive Status: Impaired/Different from baseline Area of Impairment: Orientation, Attention, Memory, Following commands, Safety/judgement, Awareness, Problem solving                 Orientation Level: Disoriented to, Time, Situation Current  Attention Level: Sustained Memory: Decreased short-term memory Following Commands: Follows one step commands with increased time Safety/Judgement: Decreased awareness of safety, Decreased awareness of deficits Awareness: Intellectual Problem Solving: Slow processing, Requires verbal cues, Requires tactile cues General Comments: showing insight into place, Pt w/ decreased awareness of needs, current abilities and what assist needed.        Exercises      General Comments        Pertinent Vitals/Pain Pain Assessment Pain Assessment: Faces Faces Pain Scale: Hurts little more Pain Location: generalized Pain Descriptors / Indicators: Sore Pain Intervention(s): Monitored during session, Limited activity within patient's tolerance, Repositioned    Home Living                          Prior Function            PT Goals (current goals can now be found in the care plan section) Acute Rehab PT Goals Patient Stated Goal: None stated PT Goal Formulation: Patient unable to participate in goal setting Time For Goal Achievement: 11/11/22 Progress towards PT goals: Progressing toward goals    Frequency    Min 2X/week      PT Plan      Co-evaluation              AM-PAC PT "6 Clicks" Mobility   Outcome Measure  Help needed turning from your back to your side while in a flat bed without using bedrails?: A Little Help needed moving from lying on your back to sitting on the side of a flat bed without using bedrails?: A Lot Help needed moving to and from a bed to a chair (including a wheelchair)?: A Lot Help needed standing up from a chair using your arms (e.g., wheelchair or bedside chair)?: A Lot Help needed to walk in hospital room?: Total Help needed climbing 3-5 steps with a railing? : Total 6 Click Score: 11    End of Session   Activity Tolerance: Patient limited by fatigue Patient left: with call bell/phone within reach;in bed;with bed alarm set Nurse  Communication: Mobility status PT Visit Diagnosis: Unsteadiness on feet (R26.81);Pain Pain - part of body:  (L heel)     Time: [id number] PT Time Calculation (min) (ACUTE ONLY): 25 min  Charges:  $Therapeutic Activity: 23-37 mins                     Iven Earnhart R. PTA Acute Rehabilitation Services Office: [place] 11/03/2022, 4:04 PM

## 2022-11-03 NOTE — Plan of Care (Signed)
  Problem: Coping: Goal: Psychosocial and spiritual needs will be supported Outcome: Progressing   Problem: Respiratory: Goal: Will maintain a patent airway Outcome: Progressing   Problem: Health Behavior/Discharge Planning: Goal: Ability to manage health-related needs will improve Outcome: Progressing   Problem: Clinical Measurements: Goal: Ability to maintain clinical measurements within normal limits will improve Outcome: Progressing Goal: Will remain free from infection Outcome: Progressing Goal: Diagnostic test results will improve Outcome: Progressing Goal: Respiratory complications will improve Outcome: Progressing Goal: Cardiovascular complication will be avoided Outcome: Progressing   Problem: Activity: Goal: Risk for activity intolerance will decrease Outcome: Progressing   Problem: Nutrition: Goal: Adequate nutrition will be maintained Outcome: Progressing   Problem: Coping: Goal: Level of anxiety will decrease Outcome: Progressing   Problem: Elimination: Goal: Will not experience complications related to bowel motility Outcome: Progressing Goal: Will not experience complications related to urinary retention Outcome: Progressing   Problem: Pain Managment: Goal: General experience of comfort will improve Outcome: Progressing   Problem: Safety: Goal: Ability to remain free from injury will improve Outcome: Progressing   Problem: Skin Integrity: Goal: Risk for impaired skin integrity will decrease Outcome: Progressing

## 2022-11-03 NOTE — Progress Notes (Signed)
[facility] Progress Note Patient: [name] [id number] DOB: [birth year] DOA: 10/23/2022  DOS: the patient was seen and examined on 11/03/2022  Brief hospital course: Ms. Matkin is a 89 yr old female with PMH A-fib, CHF, dysphagia, HTN, GERD, history of PE, hypothyroidism who was brought to the hospital with worsening shortness of breath and edema.  Patient resides at home with her daughter who was noted to have COVID-19.  On testing after admission, patient was found to be positive for COVID and started on treatment. Assessment and Plan: COVID-19 infection Acute hypoxic respiratory failure. Testing positive for COVID-19 on admission with new hypoxia Completed remdesivir x 3 days.  Completing course of Decadron Continue COVID precautions.  Out of isolation on 2/27. Not on any oxygen at baseline.  Currently on 2 LPM.  Saturating 89% on room air on 2/25. PCT remains negative   Acute on chronic diastolic CHF (congestive heart failure) ([place]) Unclear how much of CXR findings represent CHF vs COVID PNA BNP elevated; pulmonary edema, small pleural effusions; DOE; pulmonary crackles, B/L leg edema Continue bumex and Aldactone Echo updated: Severe AS new.  EF 60 to 65%, no RWMA, mild LVH.  RV moderately enlarged and elevated PA pressures which are previously known.  Severely dilated LA and RA. Small pericardial effusion BNP appears to be improving. Bumex dose increased.  Monitor renal function.  Significant improvement in volume status.  Will return back to 2 mg twice daily dose of Bumex.   Severe aortic stenosis Per echo read this appears new. Per report: "Apical doppler suggests severe AS though signal could be contaminated " Cardiology was consulted. Poor TAVR candidate but cardiology will continue to follow patient at discharge as well   Chronic kidney disease, stage 3b [facility]) Patient has history of CKD3b. Baseline creat ~ 1.4, eGFR 33 Renal function stable.   Hyponatremia. Related  to steroid use as well as volume overload. Monitor for now.  Add Ensure.  Add urea packet.   Personal history of pulmonary embolism Looks like she takes coumadin chronically for this and for permanent a.fib continue coumadin per pharmacy   Permanent atrial fibrillation ([place]) Cont coumadin.  INR is somewhat subtherapeutic today.  Patient will be receiving a higher dose of Coumadin today. BB stopped recently due to hypotension.   Chronic thromboembolic pulmonary hypertension (HCC) PAH due to prior PEs.  Lower extremity Doppler negative for DVT.   Constipation. Add Dulcolax suppository.   Subjective: Patient reports pain on her left knee.  No nausea vomiting or no fever no chills.  Daughter reported that the patient had some burning while urinating although patient denied any burning urination at the time of my evaluation earlier.  Physical Exam: General: in Mild distress, No Rash Cardiovascular: S1 and S2 Present, No Murmur Respiratory: Good respiratory effort, Bilateral Air entry present. Basal Crackles, No wheezes Abdomen: Bowel Sound present, No tenderness Extremities: No edema Neuro: Alert and oriented x3, no new focal deficit  Data Reviewed: I have Reviewed nursing notes, Vitals, and Lab results. Since last encounter, pertinent lab results CBC and BMP   . I have ordered test including CBC and BMP  . I have ordered imaging x-ray knee  .   Disposition: Status is: Inpatient Remains inpatient appropriate because: Need stabilization of sodium.   Family Communication: d/w daughter on the phone Level of care: Telemetry Medical   Vitals:   11/02/22 2144 11/03/22 0500 11/03/22 0741 11/03/22 1525  BP: 101/61  (!) 101/55 111/68  Pulse: 73  (!)  57 (!) 51  Resp:   18 18  Temp:   98.6 F (37 C) 98.4 F (36.9 C)  TempSrc:   Oral Oral  SpO2:   97% 95%  Weight:  86.6 kg    Height:         Author: Berle Mull, MD 11/03/2022 7:13 PM  Please look on [email or web address] to find out  who is on call.

## 2022-11-03 NOTE — Progress Notes (Signed)
ANTICOAGULATION CONSULT NOTE - Follow Up Consult  Pharmacy Consult for Warfarin Indication: atrial fibrillation and hx PE  Patient Measurements: Height: '5\' 5"'$  (165.1 cm) Weight: 86.6 kg (190 lb 14.7 oz) IBW/kg (Calculated) : 57  Vital Signs: Temp: 98.6 F (37 C) (02/26 0741) Temp Source: Oral (02/26 0741) BP: 101/55 (02/26 0741) Pulse Rate: 57 (02/26 0741)  Labs: Recent Labs    11/01/22 0720 11/01/22 1827 11/02/22 0340 11/02/22 1812 11/03/22 0713  HGB 11.2*  --   --   --  11.3*  HCT 35.4*  --   --   --  35.6*  PLT 95*  --   --   --  109*  LABPROT 27.1*  --   --   --  19.2*  INR 2.5*  --   --   --  1.6*  CREATININE 1.25* 1.20* 1.23* 1.23*  --      Estimated Creatinine Clearance: 31.7 mL/min (A) (by C-G formula based on SCr of 1.23 mg/dL (H)).  Assessment: 89 yr old female admitted for hypoxia in the setting of COVID and anasarca. Pharmacy consulted for continuation of PTA Warfarin for atrial fibrillation and hx PE. 2/18 warfarin resumed.  PTA warfarin regimen: 2 mg MWF, 1 mg TTSS  INR now SUBtherapeutic at 1.6. Hgb 10-11s stable, chronic thrombocytopenia: Plt stable. No bleeding reported. PO intake is somewhat variable, mostly between 25-55%  Goal of Therapy:  INR 2-3 Monitor platelets by anticoagulation protocol: Yes    Plan:  Warfarin 3 mg PO x 1 Monitor daily INR, CBC, clinical course, s/sx of bleed, PO intake/diet, Drug-Drug Interactions     Thank you for allowing [place] to participate in this patients care. Jens Som, PharmD 11/03/2022 10:30 AM  **Pharmacist phone directory can be found on [email or web address] listed under [place]**

## 2022-11-03 NOTE — TOC Progression Note (Addendum)
Transition of Care [facility]) - Initial/Assessment Note    Patient Details  Name: [name] MRN: [id number] Date of Birth: 02-01-1928  Transition of Care [facility]) CM/SW Contact:    Milinda Antis, [place] Phone Number: 11/03/2022, 8:54 AM  Clinical Narrative:                 LCSW contacted the patient's daughter in an attempt to present be offers as the patient is not fully oriented.  There was no answer.  LCSW left voicemail requesting a returned call.    13:30-  LCSW received a returned call from the patient's daughter.  Bed offers were presented.  The family will review bed offers and inform LCSW of whether the family is agreeable to SNF placement at one of the facilities given or home with home health.   TOC following.  TOC following.     Barriers to Discharge: Continued Medical Work up   Patient Goals and CMS Choice Patient states their goals for this hospitalization and ongoing recovery are:: To return home CMS Medicare.gov Compare Post Acute Care list provided to:: Patient Choice offered to / list presented to : Patient, Adult Children (Daughter, [name])      Expected Discharge Plan and Services   Discharge Planning Services: CM Consult   Living arrangements for the past 2 months: Single Family Home                                      Prior Living Arrangements/Services Living arrangements for the past 2 months: Single Family Home Lives with:: Adult Children (Daughter, [name]) Patient language and need for interpreter reviewed:: Yes Do you feel safe going back to the place where you live?: Yes      Need for Family Participation in Patient Care: Yes (Comment) Care giver support system in place?: Yes (comment)   Criminal Activity/Legal Involvement Pertinent to Current Situation/Hospitalization: No - Comment as needed  Activities of Daily Living Home Assistive Devices/Equipment: Walker (specify type) ADL Screening (condition at time of admission) Patient's  cognitive ability adequate to safely complete daily activities?: Yes Is the patient deaf or have difficulty hearing?: No Does the patient have difficulty seeing, even when wearing glasses/contacts?: No Does the patient have difficulty concentrating, remembering, or making decisions?: Yes Patient able to express need for assistance with ADLs?: Yes Does the patient have difficulty dressing or bathing?: Yes Independently performs ADLs?: No Communication: Independent Dressing (OT): Needs assistance Is this a change from baseline?: Change from baseline, expected to last >3 days Grooming: Needs assistance Is this a change from baseline?: Change from baseline, expected to last >3 days Feeding: Independent Bathing: Needs assistance Is this a change from baseline?: Change from baseline, expected to last >3 days Toileting: Needs assistance Is this a change from baseline?: Change from baseline, expected to last >3days In/Out Bed: Dependent Is this a change from baseline?: Change from baseline, expected to last >3 days Walks in Home: Needs assistance Is this a change from baseline?: Change from baseline, expected to last >3 days Does the patient have difficulty walking or climbing stairs?: Yes Weakness of Legs: Both Weakness of Arms/Hands: Both  Permission Sought/Granted Permission sought to share information with : Case Manager, Family Supports Permission granted to share information with : Yes, Verbal Permission Granted              Emotional Assessment Appearance:: Appears stated age Attitude/Demeanor/Rapport:  Engaged, Gracious Affect (typically observed): Accepting, Appropriate, Calm, Hopeful, Pleasant Orientation: : Oriented to Self Alcohol / Substance Use: Not Applicable Psych Involvement: No (comment)  Admission diagnosis:  Anasarca [R60.1] Peripheral edema [R60.9] Acute respiratory failure with hypoxia ([place]) [J96.01] Pneumonia due to COVID-19 virus [U07.1, J12.82] COVID-19  [U07.1] Patient Active Problem List   Diagnosis Date Noted   Severe aortic stenosis 10/26/2022   Pneumonia due to COVID-19 virus 10/24/2022   COVID-19 10/23/2022   Hypoxia 10/23/2022   Positive blood culture 11/20/2021   Generalized weakness 11/20/2021   Thrombocytopenia ([place]) 11/17/2021   Anemia of chronic disease 11/17/2021   AKI on CKD-3B 11/17/2021   Osteomyelitis ([place]) 11/16/2021   Acute on chronic diastolic CHF (congestive heart failure) ([place]) 11/16/2021   Hyponatremia 11/16/2021   Chronic kidney disease, stage 3b ([place]) 11/16/2021   Leukocytosis 11/16/2021   Uncontrolled hypertension 11/15/2021   Hyperthyroidism 11/15/2021   Peripheral venous insufficiency 11/15/2021   Personal history of pulmonary embolism 11/15/2021   Osteomyelitis of second toe of right foot ([place]) 11/15/2021   Cellulitis 11/15/2021   Chronic thromboembolic pulmonary hypertension ([place]) 05/12/2014   Permanent atrial fibrillation ([place]) 06/20/2009   Chronic obstructive lung disease ([place]) 04/04/2009   Gastroesophageal reflux disease 12/25/2008   Generalized anxiety disorder 12/25/2008   Hypercholesterolemia 12/25/2008   Hypothyroidism 12/25/2008   PCP:  Saintclair Halsted, FNP Pharmacy[facility]   [id number] [place] [address] [place]Phone: [phone]: [phone]    Social Determinants of Health (SDOH) Social History: SDOH Screenings   Food Insecurity: Food Insecurity Present (10/27/2022)  Housing: Low Risk  (10/27/2022)  Transportation Needs: No Transportation Needs (10/27/2022)  Utilities: Not At Risk (10/27/2022)  Tobacco Use: Medium Risk (10/23/2022)   SDOH Interventions:     Readmission Risk Interventions    11/18/2021   12:55 PM  Readmission Risk Prevention Plan  Transportation Screening Complete  PCP or Specialist Appt within 3-5 Days Complete  HRI or HDeWittComplete  Social Work Consult for R[place] Planning/Counseling Complete  Palliative Care Screening Complete  Medication Review (Press photographer Complete

## 2022-11-04 ENCOUNTER — Inpatient Hospital Stay (HOSPITAL_COMMUNITY): Payer: Medicare HMO

## 2022-11-04 DIAGNOSIS — M79609 Pain in unspecified limb: Secondary | ICD-10-CM

## 2022-11-04 DIAGNOSIS — U071 COVID-19: Secondary | ICD-10-CM | POA: Diagnosis not present

## 2022-11-04 LAB — CBC WITH DIFFERENTIAL/PLATELET
Abs Immature Granulocytes: 0.03 10*3/uL (ref 0.00–0.07)
Basophils Absolute: 0 10*3/uL (ref 0.0–0.1)
Basophils Relative: 1 %
Eosinophils Absolute: 0.1 10*3/uL (ref 0.0–0.5)
Eosinophils Relative: 2 %
HCT: 34.9 % — ABNORMAL LOW (ref 36.0–46.0)
Hemoglobin: 10.9 g/dL — ABNORMAL LOW (ref 12.0–15.0)
Immature Granulocytes: 1 %
Lymphocytes Relative: 14 %
Lymphs Abs: 0.8 10*3/uL (ref 0.7–4.0)
MCH: 27 pg (ref 26.0–34.0)
MCHC: 31.2 g/dL (ref 30.0–36.0)
MCV: 86.6 fL (ref 80.0–100.0)
Monocytes Absolute: 0.7 10*3/uL (ref 0.1–1.0)
Monocytes Relative: 13 %
Neutro Abs: 3.9 10*3/uL (ref 1.7–7.7)
Neutrophils Relative %: 69 %
Platelets: 103 10*3/uL — ABNORMAL LOW (ref 150–400)
RBC: 4.03 MIL/uL (ref 3.87–5.11)
RDW: 16.4 % — ABNORMAL HIGH (ref 11.5–15.5)
WBC: 5.6 10*3/uL (ref 4.0–10.5)
nRBC: 0 % (ref 0.0–0.2)

## 2022-11-04 LAB — COMPREHENSIVE METABOLIC PANEL
ALT: 18 U/L (ref 0–44)
AST: 32 U/L (ref 15–41)
Albumin: 2.4 g/dL — ABNORMAL LOW (ref 3.5–5.0)
Alkaline Phosphatase: 96 U/L (ref 38–126)
Anion gap: 7 (ref 5–15)
BUN: 44 mg/dL — ABNORMAL HIGH (ref 8–23)
CO2: 41 mmol/L — ABNORMAL HIGH (ref 22–32)
Calcium: 9 mg/dL (ref 8.9–10.3)
Chloride: 81 mmol/L — ABNORMAL LOW (ref 98–111)
Creatinine, Ser: 1.33 mg/dL — ABNORMAL HIGH (ref 0.44–1.00)
GFR, Estimated: 38 mL/min — ABNORMAL LOW (ref >60)
Glucose, Bld: 106 mg/dL — ABNORMAL HIGH (ref 70–99)
Potassium: 4 mmol/L (ref 3.5–5.1)
Sodium: 129 mmol/L — ABNORMAL LOW (ref 135–145)
Total Bilirubin: 1.3 mg/dL — ABNORMAL HIGH (ref 0.3–1.2)
Total Protein: 5.6 g/dL — ABNORMAL LOW (ref 6.5–8.1)

## 2022-11-04 LAB — PROTIME-INR
INR: 1.5 — ABNORMAL HIGH (ref 0.8–1.2)
Prothrombin Time: 17.9 seconds — ABNORMAL HIGH (ref 11.4–15.2)

## 2022-11-04 LAB — VAS US ABI WITH/WO TBI
Left ABI: 0.46
Right ABI: 0.48

## 2022-11-04 LAB — MAGNESIUM: Magnesium: 2 mg/dL (ref 1.7–2.4)

## 2022-11-04 MED ORDER — WARFARIN SODIUM 2 MG PO TABS
2.0000 mg | ORAL_TABLET | Freq: Once | ORAL | Status: AC
  Administered 2022-11-04: 2 mg via ORAL
  Filled 2022-11-04: qty 1

## 2022-11-04 MED ORDER — ACETAMINOPHEN 500 MG PO TABS
1000.0000 mg | ORAL_TABLET | Freq: Three times a day (TID) | ORAL | Status: DC
  Administered 2022-11-04 – 2022-11-07 (×9): 1000 mg via ORAL
  Filled 2022-11-04 (×9): qty 2

## 2022-11-04 MED ORDER — ENOXAPARIN SODIUM 80 MG/0.8ML IJ SOSY
80.0000 mg | PREFILLED_SYRINGE | Freq: Once | INTRAMUSCULAR | Status: AC
  Administered 2022-11-04: 80 mg via SUBCUTANEOUS
  Filled 2022-11-04: qty 0.8

## 2022-11-04 NOTE — TOC Progression Note (Addendum)
Transition of Care [facility]) - Initial/Assessment Note    Patient Details  Name: [name] MRN: [id number] Date of Birth: 03-Apr-1928  Transition of Care [name] CM/SW Contact:    Milinda Antis, [place] Phone Number: 11/04/2022, 10:04 AM  Clinical Narrative:                 LCSW contacted the patient's daughter to receive facility choice.  There was no answer.  LCSW left a VM requesting a returned call.    15:30-  LCSW spoke with the patient's daughter and the family has accepted the bed offer at [facility]  LCSW contacted the facility, solidified, bed offer and requested that they begin insurance auth.  TOC following.     Barriers to Discharge: Continued Medical Work up   Patient Goals and CMS Choice Patient states their goals for this hospitalization and ongoing recovery are:: To return home CMS Medicare.gov Compare Post Acute Care list provided to:: Patient Choice offered to / list presented to : Patient, Adult Children (Daughter, [name])      Expected Discharge Plan and Services   Discharge Planning Services: CM Consult   Living arrangements for the past 2 months: Single Family Home                                      Prior Living Arrangements/Services Living arrangements for the past 2 months: Single Family Home Lives with:: Adult Children (Daughter, [name]) Patient language and need for interpreter reviewed:: Yes Do you feel safe going back to the place where you live?: Yes      Need for Family Participation in Patient Care: Yes (Comment) Care giver support system in place?: Yes (comment)   Criminal Activity/Legal Involvement Pertinent to Current Situation/Hospitalization: No - Comment as needed  Activities of Daily Living Home Assistive Devices/Equipment: Walker (specify type) ADL Screening (condition at time of admission) Patient's cognitive ability adequate to safely complete daily activities?: Yes Is the patient deaf or have difficulty hearing?:  No Does the patient have difficulty seeing, even when wearing glasses/contacts?: No Does the patient have difficulty concentrating, remembering, or making decisions?: Yes Patient able to express need for assistance with ADLs?: Yes Does the patient have difficulty dressing or bathing?: Yes Independently performs ADLs?: No Communication: Independent Dressing (OT): Needs assistance Is this a change from baseline?: Change from baseline, expected to last >3 days Grooming: Needs assistance Is this a change from baseline?: Change from baseline, expected to last >3 days Feeding: Independent Bathing: Needs assistance Is this a change from baseline?: Change from baseline, expected to last >3 days Toileting: Needs assistance Is this a change from baseline?: Change from baseline, expected to last >3days In/Out Bed: Dependent Is this a change from baseline?: Change from baseline, expected to last >3 days Walks in Home: Needs assistance Is this a change from baseline?: Change from baseline, expected to last >3 days Does the patient have difficulty walking or climbing stairs?: Yes Weakness of Legs: Both Weakness of Arms/Hands: Both  Permission Sought/Granted Permission sought to share information with : Case Manager, Family Supports Permission granted to share information with : Yes, Verbal Permission Granted              Emotional Assessment Appearance:: Appears stated age Attitude/Demeanor/Rapport: Engaged, Gracious Affect (typically observed): Accepting, Appropriate, Calm, Hopeful, Pleasant Orientation: : Oriented to Self Alcohol / Substance Use: Not Applicable Psych Involvement: No (comment)  Admission diagnosis:  Anasarca [R60.1] Peripheral edema [R60.9] Acute respiratory failure with hypoxia (HCC) [J96.01] Pneumonia due to COVID-19 virus [U07.1, J12.82] COVID-19 [U07.1] Patient Active Problem List   Diagnosis Date Noted   Severe aortic stenosis 10/26/2022   Pneumonia due to  COVID-19 virus 10/24/2022   COVID-19 10/23/2022   Hypoxia 10/23/2022   Positive blood culture 11/20/2021   Generalized weakness 11/20/2021   Thrombocytopenia (HCC) 11/17/2021   Anemia of chronic disease 11/17/2021   AKI on CKD-3B 11/17/2021   Osteomyelitis ([place]) 11/16/2021   Acute on chronic diastolic CHF (congestive heart failure) ([place]) 11/16/2021   Hyponatremia 11/16/2021   Chronic kidney disease, stage 3b ([place]) 11/16/2021   Leukocytosis 11/16/2021   Uncontrolled hypertension 11/15/2021   Hyperthyroidism 11/15/2021   Peripheral venous insufficiency 11/15/2021   Personal history of pulmonary embolism 11/15/2021   Osteomyelitis of second toe of right foot ([place]) 11/15/2021   Cellulitis 11/15/2021   Chronic thromboembolic pulmonary hypertension ([place]) 05/12/2014   Permanent atrial fibrillation ([place]) 06/20/2009   Chronic obstructive lung disease ([place]) 04/04/2009   Gastroesophageal reflux disease 12/25/2008   Generalized anxiety disorder 12/25/2008   Hypercholesterolemia 12/25/2008   Hypothyroidism 12/25/2008   PCP:  Saintclair Halsted, FNP Pharmacy[facility]   [id number] [place], [address]Phone: [phone]: [phone]    Social Determinants of Health (SDOH) Social History: SDOH Screenings   Food Insecurity: Food Insecurity Present (10/27/2022)  Housing: Low Risk  (10/27/2022)  Transportation Needs: No Transportation Needs (10/27/2022)  Utilities: Not At Risk (10/27/2022)  Tobacco Use: Medium Risk (10/23/2022)   SDOH Interventions:     Readmission Risk Interventions    11/18/2021   12:55 PM  Readmission Risk Prevention Plan  Transportation Screening Complete  PCP or Specialist Appt within 3-5 Days Complete  HRI or H[place]villeComplete  Social Work Consult for R[place]idelbergPlanning/Counseling Complete  Palliative Care Screening Complete  Medication Review (Press photographer Complete

## 2022-11-04 NOTE — Progress Notes (Addendum)
ANTICOAGULATION CONSULT NOTE - Follow Up Consult  Pharmacy Consult for Warfarin Indication: atrial fibrillation and hx PE  Patient Measurements: Height: '5\' 5"'$  (165.1 cm) Weight: 84.4 kg (186 lb 1.1 oz) IBW/kg (Calculated) : 57  Vital Signs: Temp: 98 F (36.7 C) (02/27 0916) Temp Source: Oral (02/27 0247) BP: 110/62 (02/27 0916) Pulse Rate: 75 (02/27 0916)  Labs: Recent Labs    11/02/22 1812 11/03/22 0713 11/04/22 0421 11/04/22 0826  HGB  --  11.3*  --  10.9*  HCT  --  35.6*  --  34.9*  PLT  --  109*  --  103*  LABPROT  --  19.2* 17.9*  --   INR  --  1.6* 1.5*  --   CREATININE 1.23* 1.26*  --  1.33*     Estimated Creatinine Clearance: 29 mL/min (A) (by C-G formula based on SCr of 1.33 mg/dL (H)).  Assessment: 89 yr old female admitted for hypoxia in the setting of COVID and anasarca. Pharmacy consulted for continuation of PTA Warfarin for atrial fibrillation and hx PE. 2/18 warfarin resumed.  PTA warfarin regimen: 2 mg MWF, 1 mg TTSS  INR remains SUBtherapeutic at 1.5. Hgb 10-11s stable, chronic thrombocytopenia: Plt stable. No bleeding reported. PO intake is somewhat variable, between 25-75%  Goal of Therapy:  INR 2-3 Monitor platelets by anticoagulation protocol: Yes    Plan:  Warfarin 2 mg PO x 1 Monitor daily INR, CBC, clinical course, s/sx of bleed, PO intake/diet, Drug-Drug Interactions Will give Lovenox 80 mg subQ x 1 dose today (per conversation with MD), f/u need for additional doses    Thank you for allowing [place] to participate in this patients care. Jens Som, PharmD 11/04/2022 9:35 AM  **Pharmacist phone directory can be found on [email or web address] listed under [place]**

## 2022-11-04 NOTE — Care Management Important Message (Signed)
Important Message  Patient Details  Name: [name] MRN: [id number] Date of Birth: Apr 25, 1928   Medicare Important Message Given:  Yes     Latishia Suitt 11/04/2022, 3:02 PM

## 2022-11-04 NOTE — Progress Notes (Addendum)
Mobility Specialist Progress Note   11/04/22 1120  Mobility  Activity Transferred to/from [facility]  Level of Assistance Moderate assist, patient does 50-74%  Assistive Device Front wheel walker  Distance Ambulated (ft) 2 ft  Activity Response Tolerated fair  Mobility Referral Yes  $Mobility charge 1 Mobility   Received in bed reluctant to mobility but agreeable after max encouragement. MinA to EOB d/t general weakness and decreased core strength. Pt anxious throughout transitions but able to continue w/ consistent encouragement. Once sitting EOB pt requesting to use of BSC d/t BM urgency, modA to stand + verbal cues for proper RW mechanics d/t heavy flexed position w/ pt leaning on RW. Able to turn and pivot to [facility] w/o incident. Left on [facility] w/ call bell in reach.   Holland Falling Mobility Specialist Please contact via SecureChat or  Rehab office at [phone]

## 2022-11-04 NOTE — Progress Notes (Signed)
ABI has been completed.   Results can be found under chart review under CV PROC. 11/04/2022 5:10 PM Amybeth Sieg RVT, RDMS

## 2022-11-04 NOTE — Progress Notes (Signed)
[facility] Progress Note Patient: Megan BERTHOLF [id number] DOB: Aug 15, 1928 DOA: 10/23/2022  DOS: the patient was seen and examined on 11/04/2022  Brief hospital course: [name] is a 89 yr old female with PMH A-fib, CHF, dysphagia, HTN, GERD, history of PE, hypothyroidism who was brought to the hospital with worsening shortness of breath and edema.  Patient resides at home with her daughter who was noted to have COVID-19.  On testing after admission, patient was found to be positive for COVID and started on treatment. Assessment and Plan: COVID-19 infection Acute hypoxic respiratory failure Testing positive for COVID-19 on admission with new hypoxia Completed remdesivir x 3 days and 10-day course of Decadron. Out of isolation on 2/27. Not on any oxygen at baseline.  Currently on 2 LPM.  Saturating 89% on room air PCT remains negative   Acute on chronic diastolic CHF (congestive heart failure) (HCC) BNP elevated; pulmonary edema, small pleural effusions; DOE; pulmonary crackles, B/L leg edema Echo this admission shows Severe AS new.  EF 60 to 65%, no RWMA, mild LVH.  RV moderately enlarged and elevated PA pressures which are previously known.  Severely dilated LA and RA. Small pericardial effusion.  BNP appears to be improving. Allergic to furosemide.  On Bumex. Bumex dose increased briefly.  With improvement in volume level Continue bumex and Aldactone   Severe aortic stenosis Per echo read this appears new. Per report: "Apical doppler suggests severe AS though signal could be contaminated " Cardiology was consulted. Poor TAVR candidate but cardiology will continue to follow patient at discharge as well   Chronic kidney disease, stage 3b [facility]) Patient has history of CKD3b. Baseline creat ~ 1.4, eGFR 33 Renal function stable.   Hyponatremia. Related to steroid use as well as volume overload.  Improving. Monitor for now.  Add Ensure.   Briefly treated with urea packet.    Personal history of pulmonary embolism Permanent atrial fibrillation (HCC) Subtherapeutic INR. Chronic thromboembolic pulmonary hypertension (HCC) Cont coumadin.  INR remains subtherapeutic. Receiving 1 dose of Lovenox renally adjusted on 2/27 for bridging. BB stopped recently due to hypotension. PAH due to prior PEs.  Lower extremity Doppler negative for DVT.   Constipation. Add Dulcolax suppository.  Left leg pain. X-ray knee shows severe osteoarthritis, x-ray foot shows diffuse osteoarthritis without any acute fracture.  ABI shows severe bilateral lower extremity disease, compared to previous ABI on right from 0.52 to 0.48.  And on left difference of ABI is from 0.9 to 0.46. Outpatient follow-up with vascular surgery recommended.  Patient follows up with Dr. Scot Dock outpatient   Subjective: No nausea no vomiting no fever no chills.  Reported some neck pain.  No leg pain.  Physical Exam: General: in Mild distress, No Rash Cardiovascular: S1 and S2 Present, No Murmur Respiratory: Good respiratory effort, Bilateral Air entry present. No Crackles, No wheezes Abdomen: Bowel Sound present, No tenderness Extremities: No edema Neuro: Alert and oriented x3, no new focal deficit  Data Reviewed: I have Reviewed nursing notes, Vitals, and Lab results. Since last encounter, pertinent lab results CBC and CMP and INR   . I have ordered test including CMP and INR  .  Disposition: Status is: Inpatient Remains inpatient appropriate because: Awaiting SNF placement, medically stable.   Family Communication: Discussed with daughter on 2/26. Level of care: Telemetry Medical continue due to CHF. Vitals:   11/04/22 0247 11/04/22 0500 11/04/22 0916 11/04/22 1641  BP: 95/61  110/62 (!) 110/57  Pulse: 72  75 (!)  45  Resp: '20  18 18  '$ Temp: 97.7 F (36.5 C)  98 F (36.7 C) 98.2 F (36.8 C)  TempSrc: Oral     SpO2: 94%  99% (!) 79%  Weight:  84.4 kg    Height:         Author: Berle Mull,  MD 11/04/2022 6:31 PM  Please look on [email or web address] to find out who is on call.

## 2022-11-05 DIAGNOSIS — N1832 Chronic kidney disease, stage 3b: Secondary | ICD-10-CM | POA: Diagnosis not present

## 2022-11-05 DIAGNOSIS — U071 COVID-19: Secondary | ICD-10-CM | POA: Diagnosis not present

## 2022-11-05 DIAGNOSIS — I5033 Acute on chronic diastolic (congestive) heart failure: Secondary | ICD-10-CM | POA: Diagnosis not present

## 2022-11-05 DIAGNOSIS — R0902 Hypoxemia: Secondary | ICD-10-CM | POA: Diagnosis not present

## 2022-11-05 LAB — RENAL FUNCTION PANEL
Albumin: 2.4 g/dL — ABNORMAL LOW (ref 3.5–5.0)
Anion gap: 12 (ref 5–15)
BUN: 36 mg/dL — ABNORMAL HIGH (ref 8–23)
CO2: 36 mmol/L — ABNORMAL HIGH (ref 22–32)
Calcium: 8.9 mg/dL (ref 8.9–10.3)
Chloride: 81 mmol/L — ABNORMAL LOW (ref 98–111)
Creatinine, Ser: 1.26 mg/dL — ABNORMAL HIGH (ref 0.44–1.00)
GFR, Estimated: 40 mL/min — ABNORMAL LOW (ref >60)
Glucose, Bld: 87 mg/dL (ref 70–99)
Phosphorus: 3.3 mg/dL (ref 2.5–4.6)
Potassium: 4.4 mmol/L (ref 3.5–5.1)
Sodium: 129 mmol/L — ABNORMAL LOW (ref 135–145)

## 2022-11-05 LAB — CBC
HCT: 34.2 % — ABNORMAL LOW (ref 36.0–46.0)
Hemoglobin: 10.6 g/dL — ABNORMAL LOW (ref 12.0–15.0)
MCH: 26.8 pg (ref 26.0–34.0)
MCHC: 31 g/dL (ref 30.0–36.0)
MCV: 86.4 fL (ref 80.0–100.0)
Platelets: 118 10*3/uL — ABNORMAL LOW (ref 150–400)
RBC: 3.96 MIL/uL (ref 3.87–5.11)
RDW: 16.3 % — ABNORMAL HIGH (ref 11.5–15.5)
WBC: 6.3 10*3/uL (ref 4.0–10.5)
nRBC: 0 % (ref 0.0–0.2)

## 2022-11-05 LAB — PROTIME-INR
INR: 1.7 — ABNORMAL HIGH (ref 0.8–1.2)
Prothrombin Time: 19.9 seconds — ABNORMAL HIGH (ref 11.4–15.2)

## 2022-11-05 LAB — MAGNESIUM: Magnesium: 2.1 mg/dL (ref 1.7–2.4)

## 2022-11-05 MED ORDER — WARFARIN SODIUM 2 MG PO TABS
2.0000 mg | ORAL_TABLET | Freq: Once | ORAL | Status: AC
  Administered 2022-11-05: 2 mg via ORAL
  Filled 2022-11-05: qty 1

## 2022-11-05 MED ORDER — ENOXAPARIN SODIUM 80 MG/0.8ML IJ SOSY
80.0000 mg | PREFILLED_SYRINGE | Freq: Once | INTRAMUSCULAR | Status: AC
  Administered 2022-11-05: 80 mg via SUBCUTANEOUS
  Filled 2022-11-05: qty 0.8

## 2022-11-05 MED ORDER — SPIRONOLACTONE 12.5 MG HALF TABLET
12.5000 mg | ORAL_TABLET | Freq: Every day | ORAL | Status: DC
  Administered 2022-11-06: 12.5 mg via ORAL
  Filled 2022-11-05 (×2): qty 1

## 2022-11-05 NOTE — Progress Notes (Signed)
ANTICOAGULATION CONSULT NOTE - Follow Up Consult  Pharmacy Consult for Warfarin Indication: atrial fibrillation and hx PE  Patient Measurements: Height: '5\' 5"'$  (165.1 cm) Weight: 84.7 kg (186 lb 11.7 oz) IBW/kg (Calculated) : 57  Vital Signs: Temp: 97.9 F (36.6 C) (02/28 0504) Temp Source: Oral (02/28 0504) BP: 130/74 (02/28 0504) Pulse Rate: 73 (02/28 0504)  Labs: Recent Labs    11/02/22 1812 11/03/22 0713 11/04/22 0421 11/04/22 0826 11/05/22 0132  HGB  --  11.3*  --  10.9*  --   HCT  --  35.6*  --  34.9*  --   PLT  --  109*  --  103*  --   LABPROT  --  19.2* 17.9*  --  19.9*  INR  --  1.6* 1.5*  --  1.7*  CREATININE 1.23* 1.26*  --  1.33*  --      Estimated Creatinine Clearance: 29 mL/min (A) (by C-G formula based on SCr of 1.33 mg/dL (H)).  Assessment: 89 yr old female admitted for hypoxia in the setting of COVID and anasarca. Pharmacy consulted for continuation of PTA Warfarin for atrial fibrillation and hx PE. 2/18 warfarin resumed.  PTA warfarin regimen: 2 mg MWF, 1 mg TTSS  INR remains SUBtherapeutic at 1.7. Received Lovenox '80mg'$  SQ x1 yesterday since subtherapeutic INR. Hgb 10-11s stable, chronic thrombocytopenia - plts low, stable.   Goal of Therapy:  INR 2-3 Monitor platelets by anticoagulation protocol: Yes  Plan:  Warfarin 2 mg PO x 1 Monitor daily INR, CBC Will give additional Lovenox 80 mg subQ x 1 dose today per d/w MD (CrCl borderline for q12 dosing)  Dimple Nanas, PharmD, BCPS 11/05/2022 7:54 AM

## 2022-11-05 NOTE — TOC Progression Note (Signed)
Transition of Care [facility]) - Initial/Assessment Note    Patient Details  Name: [name] MRN: [id number] Date of Birth: [birth year]  Transition of Care [facility]) CM/SW Contact:    Milinda Antis, [place] Phone Number: 11/05/2022, 2:30 PM  Clinical Narrative:                 10:00-  LCSW contacted the facility [facility]) to receive an update on insurance authorization.  There was no answer.  LCSW left a VM for [place] in admissions requesting a returned call.  1300-  LCSW contacted the facility and was transferred to [facility] in social work.  There was no answer.  LCSW left a VM requesting a returned call.     14:21-  LCSW attempted to contact the administrator at [facility], Merrily Pew, to inquire about the status of insurance authorization.  There was no answer.  LCSW left a VM requesting a returned call.   TOC following.     Barriers to Discharge: Continued Medical Work up   Patient Goals and CMS Choice Patient states their goals for this hospitalization and ongoing recovery are:: To return home CMS Medicare.gov Compare Post Acute Care list provided to:: Patient Choice offered to / list presented to : Patient, Adult Children (Daughter, [name])      Expected Discharge Plan and Services   Discharge Planning Services: CM Consult   Living arrangements for the past 2 months: Single Family Home                                      Prior Living Arrangements/Services Living arrangements for the past 2 months: Single Family Home Lives with:: Adult Children (Daughter, [name]) Patient language and need for interpreter reviewed:: Yes Do you feel safe going back to the place where you live?: Yes      Need for Family Participation in Patient Care: Yes (Comment) Care giver support system in place?: Yes (comment)   Criminal Activity/Legal Involvement Pertinent to Current Situation/Hospitalization: No - Comment as needed  Activities of Daily Living Home Assistive Devices/Equipment:  Walker (specify type) ADL Screening (condition at time of admission) Patient's cognitive ability adequate to safely complete daily activities?: Yes Is the patient deaf or have difficulty hearing?: No Does the patient have difficulty seeing, even when wearing glasses/contacts?: No Does the patient have difficulty concentrating, remembering, or making decisions?: Yes Patient able to express need for assistance with ADLs?: Yes Does the patient have difficulty dressing or bathing?: Yes Independently performs ADLs?: No Communication: Independent Dressing (OT): Needs assistance Is this a change from baseline?: Change from baseline, expected to last >3 days Grooming: Needs assistance Is this a change from baseline?: Change from baseline, expected to last >3 days Feeding: Independent Bathing: Needs assistance Is this a change from baseline?: Change from baseline, expected to last >3 days Toileting: Needs assistance Is this a change from baseline?: Change from baseline, expected to last >3days In/Out Bed: Dependent Is this a change from baseline?: Change from baseline, expected to last >3 days Walks in Home: Needs assistance Is this a change from baseline?: Change from baseline, expected to last >3 days Does the patient have difficulty walking or climbing stairs?: Yes Weakness of Legs: Both Weakness of Arms/Hands: Both  Permission Sought/Granted Permission sought to share information with : Case Manager, Family Supports Permission granted to share information with : Yes, Verbal Permission Granted  Emotional Assessment Appearance:: Appears stated age Attitude/Demeanor/Rapport: Engaged, Gracious Affect (typically observed): Accepting, Appropriate, Calm, Hopeful, Pleasant Orientation: : Oriented to Self Alcohol / Substance Use: Not Applicable Psych Involvement: No (comment)  Admission diagnosis:  Anasarca [R60.1] Peripheral edema [R60.9] Acute respiratory failure with  hypoxia ([place]) [J96.01] Pneumonia due to COVID-19 virus [U07.1, J12.82] COVID-19 [U07.1] Patient Active Problem List   Diagnosis Date Noted   Severe aortic stenosis 10/26/2022   Pneumonia due to COVID-19 virus 10/24/2022   COVID-19 10/23/2022   Hypoxia 10/23/2022   Positive blood culture 11/20/2021   Generalized weakness 11/20/2021   Thrombocytopenia ([place]) 11/17/2021   Anemia of chronic disease 11/17/2021   AKI on CKD-3B 11/17/2021   Osteomyelitis ([place]) 11/16/2021   Acute on chronic diastolic CHF (congestive heart failure) ([place]) 11/16/2021   Hyponatremia 11/16/2021   Chronic kidney disease, stage 3b ([place]) 11/16/2021   Leukocytosis 11/16/2021   Uncontrolled hypertension 11/15/2021   Hyperthyroidism 11/15/2021   Peripheral venous insufficiency 11/15/2021   Personal history of pulmonary embolism 11/15/2021   Osteomyelitis of second toe of right foot ([place]) 11/15/2021   Cellulitis 11/15/2021   Chronic thromboembolic pulmonary hypertension ([place]) 05/12/2014   Permanent atrial fibrillation ([place]) 06/20/2009   Chronic obstructive lung disease ([place]) 04/04/2009   Gastroesophageal reflux disease 12/25/2008   Generalized anxiety disorder 12/25/2008   Hypercholesterolemia 12/25/2008   Hypothyroidism 12/25/2008   PCP:  Saintclair Halsted, FNP Pharmacy:   [id number] [id number]: [phone]: [phone]    Social Determinants of Health (SDOH) Social History: SDOH Screenings   Food Insecurity: Food Insecurity Present (10/27/2022)  Housing: Low Risk  (10/27/2022)  Transportation Needs: No Transportation Needs (10/27/2022)  Utilities: Not At Risk (10/27/2022)  Tobacco Use: Medium Risk (10/23/2022)   SDOH Interventions:     Readmission Risk Interventions    11/18/2021   12:55 PM  Readmission Risk Prevention Plan  Transportation Screening Complete  PCP or Specialist Appt within 3-5 Days Complete   [place] or [place]  Social Work Consult for RBonne TerrePlanning/Counseling Complete  Palliative Care Screening Complete  Medication Review (Press photographer Complete

## 2022-11-05 NOTE — Progress Notes (Signed)
PROGRESS NOTE  [name] [id number] DOB: Feb 11, 1928   PCP: Saintclair Halsted, FNP  Patient is from: Home.  Lives with daughter.  DOA: 10/23/2022 LOS: 81  Chief complaints Chief Complaint  Patient presents with   Edema    BIB EMS from home for bilateral edema in lower legs, seeing wound MD for wounds on legs, hx of fluid on her lungs, no hx of COPD, hx of controlled afib, no complaints of SOB at current, VSS, reports daughter currently has covid   Shortness of Breath     Brief Narrative / Interim history: 89 yr old female with PMH permanent A-fib/PE on warfarin,, diastolic CHF, [id number], dysphagia, HTN, GERD, hypothyroidism who was brought to the hospital with worsening shortness of breath and edema. Patient resides at home with her daughter who was noted to have COVID-19. On testing after admission, patient was found to be positive for COVID and started on treatment.  Patient does hospitalized for acute hypoxic respiratory failure in the setting of CHF exacerbation, COVID-19 infection and severe aortic stenosis.  Patient completed treatment course with 3 days of remdesivir and 10 days of Decadron for COVID-19 infection.  Evaluated by cardiology about severe aortic stenosis and deemed to be not a candidate for surgical intervention.  Cardiology to follow outpatient.  Patient is stable for discharge pending SNF bed.  Subjective: Seen and examined earlier this morning.  No major events overnight of this morning.  Complains of left heel pain.  Not able to elaborate.  She is not a great historian.  She is awake and oriented to self and place but not time.  Objective: Vitals:   11/04/22 2021 11/05/22 0500 11/05/22 0504 11/05/22 0944  BP: 119/69  130/74 122/65  Pulse: 70  73 65  Resp: '18  18 18  '$ Temp: 97.6 F (36.4 C)  97.9 F (36.6 C) 97.9 F (36.6 C)  TempSrc: Oral  Oral Oral  SpO2: (!) 88%  95% 95%  Weight:  84.7 kg    Height:        Examination:  GENERAL: No apparent  distress.  Nontoxic. HEENT: MMM.  Vision and hearing grossly intact.  NECK: Supple.  No apparent JVD.  RESP:  No IWOB.  Fair aeration bilaterally. CVS:  RRR. Heart sounds normal.  ABD/GI/GU: BS+. Abd soft, NTND.  MSK/EXT:  Moves extremities. No apparent deformity. No edema.  SKIN: no apparent skin lesion or wound NEURO: Awake, alert and oriented appropriately.  No apparent focal neuro deficit. PSYCH: Calm. Normal affect.   Procedures:  None  Microbiology summarized: 2/15-COVID-19 PCR positive.  Assessment and plan: Principal Problem:   COVID-19 Active Problems:   Hypoxia   Acute on chronic diastolic CHF (congestive heart failure) (HCC)   Chronic kidney disease, stage 3b (HCC)   Severe aortic stenosis   Chronic thromboembolic pulmonary hypertension (HCC)   Permanent atrial fibrillation (HCC)   Personal history of pulmonary embolism   Pneumonia due to COVID-19 virus  Acute respiratory failure with hypoxia-multifactorial including CHF exacerbation, severe aortic stenosis and COVID-19 infection.   -Treat treatable causes  -Wean oxygen as able.  Encourage incentive spirometry.   -PT/OT   Acute on chronic diastolic CHF: Presents with dyspnea, DOE, edema.  Had pulmonary edema with pleural effusion, crackles and BLE edema on presentation.  BNP elevated.  TTE with severe aortic stenosis and LVEF of 60 to 65% and moderately enlarged RV and severe LAE and RAE but no RWMA.  Reportedly allergic to furosemide.  She  is on Bumex 1 mg twice daily at home.  Still on 2 L.  1+ BLE edema. -Currently on Bumex 2 mg twice daily -Decrease Aldactone to 12.5 mg daily given persistent hyponatremia   Severe aortic stenosis -Deemed to be poor TAVR candidate but cardiology will follow outpatient with limited echo  Permanent A-fib/history of PE with PAH: On warfarin.  INR subtherapeutic. -Warfarin per pharmacy.  Bridging with Lovenox daily   CKD-3B: Relatively stable. Recent Labs    10/29/22 0301  10/30/22 0311 10/31/22 0410 11/01/22 0720 11/01/22 1827 11/02/22 0340 11/02/22 1812 11/03/22 0713 11/04/22 0826 11/05/22 0757  BUN '20 23 23 18 21 18 '$ 34* 43* 44* 36*  CREATININE 1.36* 1.49* 1.40* 1.25* 1.20* 1.23* 1.23* 1.26* 1.33* 1.26*  -Monitor  Possible cognitive impairment: She is awake and alert but only oriented to self and place. -Reorientation and delirium precaution -Minimize or avoid sedating medications   Left leg pain/left heel pain: X-ray shows severe osteoarthritis.  No acute fracture. -Agree with the scheduled Tylenol -Voltaren gel -Continue gabapentin.  Severe PAD: Right ABI 0.48 (previously 0.52).  Left ABI 0.46 (previously 0.9).  -Outpatient follow-up with vascular surgery recommended.  Patient follows up with Dr. Scot Dock outpatient  Hyponatremia: could be due to fluid overload, CKD, and Aldactone. -Diuretics as above -Decrease Aldactone. -Recheck in the morning  Hypothyroidism -Continue home Synthroid.  Constipation. -Bowel regimen  Obesity Body mass index is 31.07 kg/m.  Pressure skin injury: POA Pressure Injury 10/24/22 Pretibial Right Deep Tissue Pressure Injury - Purple or maroon localized area of discolored intact skin or blood-filled blister due to damage of underlying soft tissue from pressure and/or shear. (Active)  10/24/22 1341  Location: Pretibial  Location Orientation: Right  Staging: Deep Tissue Pressure Injury - Purple or maroon localized area of discolored intact skin or blood-filled blister due to damage of underlying soft tissue from pressure and/or shear.  Wound Description (Comments):   Present on Admission: Yes  Dressing Type Foam - Lift dressing to assess site every shift 11/05/22 0731     Pressure Injury 10/24/22 Heel Left Deep Tissue Pressure Injury - Purple or maroon localized area of discolored intact skin or blood-filled blister due to damage of underlying soft tissue from pressure and/or shear. (Active)  10/24/22 1343   Location: Heel  Location Orientation: Left  Staging: Deep Tissue Pressure Injury - Purple or maroon localized area of discolored intact skin or blood-filled blister due to damage of underlying soft tissue from pressure and/or shear.  Wound Description (Comments):   Present on Admission: Yes  Dressing Type Foam - Lift dressing to assess site every shift 11/05/22 0731     Pressure Injury 10/24/22 Heel Right Deep Tissue Pressure Injury - Purple or maroon localized area of discolored intact skin or blood-filled blister due to damage of underlying soft tissue from pressure and/or shear. (Active)  10/24/22 1343  Location: Heel  Location Orientation: Right  Staging: Deep Tissue Pressure Injury - Purple or maroon localized area of discolored intact skin or blood-filled blister due to damage of underlying soft tissue from pressure and/or shear.  Wound Description (Comments):   Present on Admission: Yes  Dressing Type Foam - Lift dressing to assess site every shift 11/05/22 0731     Pressure Injury 10/24/22 Sacrum Deep Tissue Pressure Injury - Purple or maroon localized area of discolored intact skin or blood-filled blister due to damage of underlying soft tissue from pressure and/or shear. (Active)  10/24/22 1343  Location: Sacrum  Location Orientation:  Staging: Deep Tissue Pressure Injury - Purple or maroon localized area of discolored intact skin or blood-filled blister due to damage of underlying soft tissue from pressure and/or shear.  Wound Description (Comments):   Present on Admission: Yes  Dressing Type Foam - Lift dressing to assess site every shift 11/05/22 0731   DVT prophylaxis:   warfarin (COUMADIN) tablet 2 mg  Code Status: DNR/DNI Family Communication: None at the bedside Level of care: Telemetry Medical Status is: Inpatient Remains inpatient appropriate because: Safe disposition.   Final disposition: SNF Consultants:  Cardiology  35 minutes with more than 50% spent  in reviewing records, counseling patient/family and coordinating care.   Sch Meds:  Scheduled Meds:  acetaminophen  1,000 mg Oral TID   bumetanide  2 mg Oral BID   cholecalciferol  1,000 Units Oral Daily   cyanocobalamin  1,000 mcg Oral Daily   diclofenac Sodium  4 g Topical QID   feeding supplement  237 mL Oral TID BM   gabapentin  300 mg Oral BID   levothyroxine  100 mcg Oral Q0600   lidocaine  1 patch Transdermal QHS   mupirocin cream   Topical Daily   pantoprazole  40 mg Oral Daily   polyethylene glycol  17 g Oral Daily   polyvinyl alcohol  1 drop Both Eyes BID   senna-docusate  1 tablet Oral BID   sodium chloride flush  3 mL Intravenous Q12H   [START ON 11/06/2022] spironolactone  12.5 mg Oral Daily   warfarin  2 mg Oral ONCE-1600   Warfarin - Pharmacist Dosing Inpatient   Does not apply q1600   Continuous Infusions:  sodium chloride     PRN Meds:.sodium chloride, acetaminophen, ondansetron (ZOFRAN) IV, sodium chloride flush  Antimicrobials: Anti-infectives (From admission, onward)    Start     Dose/Rate Route Frequency Ordered Stop   10/25/22 1000  remdesivir 100 mg in sodium chloride 0.9 % 100 mL IVPB       See Hyperspace for full Linked Orders Report.   100 mg 200 mL/hr over 30 Minutes Intravenous Daily 10/23/22 2302 10/26/22 1049   10/24/22 0200  remdesivir 200 mg in sodium chloride 0.9% 250 mL IVPB       See Hyperspace for full Linked Orders Report.   200 mg 580 mL/hr over 30 Minutes Intravenous Once 10/23/22 2302 10/24/22 0309        I have personally reviewed the following labs and images: CBC: Recent Labs  Lab 10/30/22 0311 11/01/22 0720 11/03/22 0713 11/04/22 0826 11/05/22 0757  WBC 3.2* 4.4 5.4 5.6 6.3  NEUTROABS 2.6 3.5  --  3.9  --   HGB 11.0* 11.2* 11.3* 10.9* 10.6*  HCT 34.2* 35.4* 35.6* 34.9* 34.2*  MCV 84.2 85.1 85.8 86.6 86.4  PLT 87* 95* 109* 103* 118*   BMP &GFR Recent Labs  Lab 10/30/22 0311 10/31/22 0410 11/02/22 0340  11/02/22 1812 11/03/22 0713 11/04/22 0826 11/05/22 0757  NA 129*   < > 127* 128* 127* 129* 129*  K 4.3   < > 3.8 3.7 4.0 4.0 4.4  CL 82*   < > 80* 80* 79* 81* 81*  CO2 37*   < > 37* 38* 40* 41* 36*  GLUCOSE 113*   < > 118* 128* 98 106* 87  BUN 23   < > 18 34* 43* 44* 36*  CREATININE 1.49*   < > 1.23* 1.23* 1.26* 1.33* 1.26*  CALCIUM 9.1   < > 8.7* 9.0 9.0  9.0 8.9  MG 1.8  --   --   --   --  2.0 2.1  PHOS  --   --   --   --   --   --  3.3   < > = values in this interval not displayed.   Estimated Creatinine Clearance: 30.6 mL/min (A) (by C-G formula based on SCr of 1.26 mg/dL (H)). Liver & Pancreas: Recent Labs  Lab 11/04/22 0826 11/05/22 0757  AST 32  --   ALT 18  --   ALKPHOS 96  --   BILITOT 1.3*  --   PROT 5.6*  --   ALBUMIN 2.4* 2.4*   No results for input(s): "LIPASE", "AMYLASE" in the last 168 hours. No results for input(s): "AMMONIA" in the last 168 hours. Diabetic: No results for input(s): "HGBA1C" in the last 72 hours. No results for input(s): "GLUCAP" in the last 168 hours. Cardiac Enzymes: No results for input(s): "CKTOTAL", "CKMB", "CKMBINDEX", "TROPONINI" in the last 168 hours. No results for input(s): "PROBNP" in the last 8760 hours. Coagulation Profile: Recent Labs  Lab 10/31/22 0410 11/01/22 0720 11/03/22 0713 11/04/22 0421 11/05/22 0132  INR 2.7* 2.5* 1.6* 1.5* 1.7*   Thyroid Function Tests: No results for input(s): "TSH", "T4TOTAL", "FREET4", "T3FREE", "THYROIDAB" in the last 72 hours. Lipid Profile: No results for input(s): "CHOL", "HDL", "LDLCALC", "TRIG", "CHOLHDL", "LDLDIRECT" in the last 72 hours. Anemia Panel: No results for input(s): "VITAMINB12", "FOLATE", "FERRITIN", "TIBC", "IRON", "RETICCTPCT" in the last 72 hours. Urine analysis:    Component Value Date/Time   COLORURINE YELLOW 11/15/2021 1743   APPEARANCEUR CLEAR 11/15/2021 1743   LABSPEC 1.012 11/15/2021 1743   PHURINE 5.0 11/15/2021 1743   GLUCOSEU NEGATIVE 11/15/2021 1743    HGBUR NEGATIVE 11/15/2021 1743   BILIRUBINUR NEGATIVE 11/15/2021 1743   [place] 11/15/2021 1743   PROTEINUR 30 (A) 11/15/2021 1743   NITRITE NEGATIVE 11/15/2021 1743   LEUKOCYTESUR NEGATIVE 11/15/2021 1743   Sepsis Labs: Invalid input(s): "PROCALCITONIN", "LACTICIDVEN"  Microbiology: No results found for this or any previous visit (from the past 240 hour(s)).  Radiology Studies: VAS [place] ABI WITH/WO TBI  Result Date: 11/04/2022  LOWER EXTREMITY DOPPLER STUDY Patient Name:  ALGENE HOLLANDER  Date of Exam:   11/04/2022 Medical Rec #: [id number]      Accession #:    [id number] Date of Birth: 1928/04/27       Patient Gender: F Patient Age:   55 years Exam Location:  [facility] Procedure:      VAS [place] ABI WITH/WO TBI Referring Phys: PRANAV PATEL --------------------------------------------------------------------------------  Indications: leg pain High Risk Factors: Hypertension, hyperlipidemia, past history of smoking. Other Factors: Afib, CKD,.  Performing Technologist: Rogelia Rohrer RVT, RDMS  Examination Guidelines: A complete evaluation includes at minimum, Doppler waveform signals and systolic blood pressure reading at the level of bilateral brachial, anterior tibial, and posterior tibial arteries, when vessel segments are accessible. Bilateral testing is considered an integral part of a complete examination. Photoelectric Plethysmograph (PPG) waveforms and toe systolic pressure readings are included as required and additional duplex testing as needed. Limited examinations for reoccurring indications may be performed as noted.  ABI Findings: +---------+------------------+-----+----------+--------+ Right    Rt Pressure (mmHg)IndexWaveform  Comment  +---------+------------------+-----+----------+--------+ Brachial 159                    triphasic          +---------+------------------+-----+----------+--------+ PTA      77  0.48 monophasic          +---------+------------------+-----+----------+--------+ DP       76                0.48 monophasic         +---------+------------------+-----+----------+--------+ Great Toe62                0.39 Abnormal           +---------+------------------+-----+----------+--------+ +---------+------------------+-----+----------+-------+ Left     Lt Pressure (mmHg)IndexWaveform  Comment +---------+------------------+-----+----------+-------+ Brachial 145                    biphasic          +---------+------------------+-----+----------+-------+ PTA      73                0.46 monophasic        +---------+------------------+-----+----------+-------+ DP       73                0.46 monophasic        +---------+------------------+-----+----------+-------+ Great Toe34                0.21 Abnormal          +---------+------------------+-----+----------+-------+ +-------+-----------+-----------+------------+------------+ ABI/TBIToday's ABIToday's TBIPrevious ABIPrevious TBI +-------+-----------+-----------+------------+------------+ Right  0.48       0.39       0.52        0            +-------+-----------+-----------+------------+------------+ Left   0.46       0.21       0.93        0.38         +-------+-----------+-----------+------------+------------+  Summary: Right: Resting right ankle-brachial index indicates severe right lower extremity arterial disease. The right toe-brachial index is abnormal. Left: Resting left ankle-brachial index indicates severe left lower extremity arterial disease. The left toe-brachial index is abnormal. *See table(s) above for measurements and observations.  Electronically signed by Deitra Mayo MD on 11/04/2022 at 7:32:57 PM.    Final    DG Foot Complete Left  Result Date: 11/04/2022 CLINICAL DATA:  Left foot pain EXAM: LEFT FOOT - COMPLETE 3+ VIEW COMPARISON:  None Available. FINDINGS: Frontal, oblique, and lateral views of  the left foot are obtained. Hammertoe deformities are noted. No acute displaced fracture. Mild diffuse osteoarthritis greatest at the tarsometatarsal joints and first metatarsophalangeal joint. Soft tissues are unremarkable. IMPRESSION: 1. Mild diffuse osteoarthritis. 2. Hammertoe deformities. 3. No acute fracture. Electronically Signed   By: Randa Ngo M.D.   On: 11/04/2022 18:28      Aicha Clingenpeel T. [place]  If 7PM-7AM, please contact night-coverage [email or web address] 11/05/2022, 1:57 PM

## 2022-11-05 NOTE — Progress Notes (Signed)
OT Cancellation Note  Patient Details Name: [name] MRN: [id number] DOB: 14-Nov-1927   Cancelled Treatment:    Reason Eval/Treat Not Completed: Other (comment) Attempted OT session x2 this AM with pt sleeping on first attempt and then eating breakfast on second attempt around 10:30AM. If unable to check back this PM, will follow up tomorrow for OT session.   Layla Maw 11/05/2022, 10:52 AM

## 2022-11-06 DIAGNOSIS — I5033 Acute on chronic diastolic (congestive) heart failure: Secondary | ICD-10-CM | POA: Diagnosis not present

## 2022-11-06 DIAGNOSIS — U071 COVID-19: Secondary | ICD-10-CM | POA: Diagnosis not present

## 2022-11-06 DIAGNOSIS — N1832 Chronic kidney disease, stage 3b: Secondary | ICD-10-CM | POA: Diagnosis not present

## 2022-11-06 DIAGNOSIS — R0902 Hypoxemia: Secondary | ICD-10-CM | POA: Diagnosis not present

## 2022-11-06 LAB — RENAL FUNCTION PANEL
Albumin: 2.6 g/dL — ABNORMAL LOW (ref 3.5–5.0)
Anion gap: 9 (ref 5–15)
BUN: 34 mg/dL — ABNORMAL HIGH (ref 8–23)
CO2: 38 mmol/L — ABNORMAL HIGH (ref 22–32)
Calcium: 8.9 mg/dL (ref 8.9–10.3)
Chloride: 79 mmol/L — ABNORMAL LOW (ref 98–111)
Creatinine, Ser: 1.34 mg/dL — ABNORMAL HIGH (ref 0.44–1.00)
GFR, Estimated: 37 mL/min — ABNORMAL LOW (ref >60)
Glucose, Bld: 79 mg/dL (ref 70–99)
Phosphorus: 3.7 mg/dL (ref 2.5–4.6)
Potassium: 4.2 mmol/L (ref 3.5–5.1)
Sodium: 126 mmol/L — ABNORMAL LOW (ref 135–145)

## 2022-11-06 LAB — CBC
HCT: 32.7 % — ABNORMAL LOW (ref 36.0–46.0)
Hemoglobin: 10.2 g/dL — ABNORMAL LOW (ref 12.0–15.0)
MCH: 26.8 pg (ref 26.0–34.0)
MCHC: 31.2 g/dL (ref 30.0–36.0)
MCV: 86.1 fL (ref 80.0–100.0)
Platelets: 126 10*3/uL — ABNORMAL LOW (ref 150–400)
RBC: 3.8 MIL/uL — ABNORMAL LOW (ref 3.87–5.11)
RDW: 16.8 % — ABNORMAL HIGH (ref 11.5–15.5)
WBC: 6.6 10*3/uL (ref 4.0–10.5)
nRBC: 0 % (ref 0.0–0.2)

## 2022-11-06 LAB — PROTIME-INR
INR: 2.3 — ABNORMAL HIGH (ref 0.8–1.2)
Prothrombin Time: 25.2 seconds — ABNORMAL HIGH (ref 11.4–15.2)

## 2022-11-06 LAB — MAGNESIUM: Magnesium: 2.1 mg/dL (ref 1.7–2.4)

## 2022-11-06 LAB — GLUCOSE, CAPILLARY: Glucose-Capillary: 163 mg/dL — ABNORMAL HIGH (ref 70–99)

## 2022-11-06 MED ORDER — ACETAMINOPHEN 325 MG PO TABS: 650.0000 mg | ORAL_TABLET | Freq: Four times a day (QID) | ORAL | Status: AC | PRN

## 2022-11-06 MED ORDER — SPIRONOLACTONE 25 MG PO TABS: 12.5000 mg | ORAL_TABLET | Freq: Every day | ORAL | Status: DC

## 2022-11-06 MED ORDER — DICLOFENAC SODIUM 1 % EX GEL: 4.0000 g | Freq: Four times a day (QID) | CUTANEOUS | Status: AC

## 2022-11-06 MED ORDER — POLYETHYLENE GLYCOL 3350 17 GM/SCOOP PO POWD: 17.0000 g | Freq: Two times a day (BID) | ORAL | 0 refills | Status: DC | PRN

## 2022-11-06 MED ORDER — ENSURE ENLIVE PO LIQD: 237.0000 mL | Freq: Three times a day (TID) | ORAL | 12 refills | Status: DC

## 2022-11-06 MED ORDER — BUMETANIDE 1 MG PO TABS: 1.0000 mg | ORAL_TABLET | Freq: Two times a day (BID) | ORAL | Status: DC

## 2022-11-06 MED ORDER — WARFARIN SODIUM 1 MG PO TABS
1.0000 mg | ORAL_TABLET | Freq: Once | ORAL | Status: AC
  Administered 2022-11-06: 1 mg via ORAL
  Filled 2022-11-06: qty 1

## 2022-11-06 MED ORDER — SENNOSIDES-DOCUSATE SODIUM 8.6-50 MG PO TABS: 1.0000 | ORAL_TABLET | Freq: Two times a day (BID) | ORAL | 0 refills | Status: AC | PRN

## 2022-11-06 MED ORDER — BUMETANIDE 1 MG PO TABS
1.0000 mg | ORAL_TABLET | Freq: Two times a day (BID) | ORAL | Status: DC
  Administered 2022-11-06 (×2): 1 mg via ORAL
  Filled 2022-11-06 (×4): qty 1

## 2022-11-06 NOTE — Progress Notes (Signed)
Occupational Therapy Treatment Patient Details Name: [name] MRN: [id number] DOB: [birth year] Today's Date: 11/06/2022   History of present illness Pt is a 89 y/o female admitted for hypoxia in setting of COVID-19 infection and acute on chronic CHF. PMH: a fib, cHF, PE, R toe amputation, HTN, hiatal hernia, thyroid disease.   OT comments  On entry, pt expressing need for BM. Focused session on [facility] transfers, safety with RW use, skin integrity and gradual increase of activity tolerance. Pt's granddaughter present and noted improved interactions with family present. Continue to rec SNF rehab at [place] as pt below functional baseline and at risk for falls without consistent hands on assist.   Recommendations for follow up therapy are one component of a multi-disciplinary discharge planning process, led by the attending physician.  Recommendations may be updated based on patient status, additional functional criteria and insurance authorization.    Follow Up Recommendations  Skilled nursing-short term rehab (<3 hours/day)     Assistance Recommended at Discharge Frequent or constant Supervision/Assistance  Patient can return home with the following  A lot of help with bathing/dressing/bathroom;A little help with walking and/or transfers   Equipment Recommendations  None recommended by OT    Recommendations for Other Services      Precautions / Restrictions Precautions Precautions: Fall Precaution Comments: monitor O2 (does not wear at baseline); urine incontinence Restrictions Weight Bearing Restrictions: No       Mobility Bed Mobility               General bed mobility comments: up in chair on entry    Transfers Overall transfer level: Needs assistance Equipment used: Rolling walker (2 wheels) Transfers: Sit to/from Stand, Bed to chair/wheelchair/BSC Sit to Stand: Min guard     Step pivot transfers: Min assist     General transfer comment: see BSC transfer in ADL  section     Balance Overall balance assessment: Needs assistance Sitting-balance support: Feet supported, No upper extremity supported Sitting balance-Leahy Scale: Fair     Standing balance support: Bilateral upper extremity supported, During functional activity, Reliant on assistive device for balance Standing balance-Leahy Scale: Poor                             ADL either performed or assessed with clinical judgement   ADL Overall ADL's : Needs assistance/impaired                         Toilet Transfer: Minimal assistance;Stand-pivot;BSC/3in1;Rolling walker (2 wheels) Toilet Transfer Details (indicate cue type and reason): to/from [facility] from recliner, minor assist for RW navigation Toileting- Clothing Manipulation and Hygiene: Total assistance;Sit to/from stand Toileting - Clothing Manipulation Details (indicate cue type and reason): Total A for peri care after BM and initial incontinence during transfer to [facility]            Extremity/Trunk Assessment Upper Extremity Assessment Upper Extremity Assessment: Generalized weakness   Lower Extremity Assessment Lower Extremity Assessment: Defer to PT evaluation        Vision   Vision Assessment?: No apparent visual deficits   Perception     Praxis      Cognition Arousal/Alertness: Awake/alert Behavior During Therapy: WFL for tasks assessed/performed, Flat affect Overall Cognitive Status: Impaired/Different from baseline Area of Impairment: Orientation, Attention, Memory, Following commands, Safety/judgement, Awareness, Problem solving  Orientation Level: Disoriented to, Time, Situation Current Attention Level: Sustained Memory: Decreased short-term memory Following Commands: Follows one step commands with increased time Safety/Judgement: Decreased awareness of safety, Decreased awareness of deficits Awareness: Intellectual Problem Solving: Slow processing, Requires verbal  cues, Requires tactile cues General Comments: flat affect, did demonstrate consistent appropriate responses with granddaughter in the room. following directions consistently        Exercises      Shoulder Instructions       General Comments Granddaughter present and supportive, hands on to assist. Noted pt complaining of itching and small scabby areas noted at lower part of back - RN aware and in to assist    Pertinent Vitals/ Pain       Pain Assessment Pain Assessment: Faces Faces Pain Scale: Hurts a little bit Pain Location: generalized Pain Descriptors / Indicators: Sore Pain Intervention(s): Monitored during session  Home Living                                          Prior Functioning/Environment              Frequency  Min 2X/week        Progress Toward Goals  OT Goals(current goals can now be found in the care plan section)  Progress towards OT goals: Progressing toward goals  Acute Rehab OT Goals Patient Stated Goal: feel better, stop itching OT Goal Formulation: With patient Time For Goal Achievement: 11/11/22 Potential to Achieve Goals: Fair ADL Goals Pt Will Perform Lower Body Bathing: with min assist;sit to/from stand Pt Will Perform Lower Body Dressing: with min assist;sit to/from stand Pt Will Transfer to Toilet: with min assist;stand pivot transfer;bedside commode  Plan Discharge plan remains appropriate    Co-evaluation                 AM-PAC OT "6 Clicks" Daily Activity     Outcome Measure   Help from another person eating meals?: A Little Help from another person taking care of personal grooming?: A Little Help from another person toileting, which includes using toliet, bedpan, or urinal?: A Lot Help from another person bathing (including washing, rinsing, drying)?: A Lot Help from another person to put on and taking off regular upper body clothing?: A Little Help from another person to put on and taking off  regular lower body clothing?: Total 6 Click Score: 14    End of Session Equipment Utilized During Treatment: Rolling walker (2 wheels)  OT Visit Diagnosis: Muscle weakness (generalized) (M62.81);Other symptoms and signs involving cognitive function   Activity Tolerance Patient tolerated treatment well   Patient Left in chair;with call bell/phone within reach;with chair alarm set;with family/visitor present   Nurse Communication Mobility status        Time: [id number] OT Time Calculation (min): 38 min  Charges: OT General Charges $OT Visit: 1 Visit OT Treatments $Self Care/Home Management : 23-37 mins  Malachy Chamber, OTR/L Acute Rehab Services Office: [phone]   Layla Maw 11/06/2022, 1:49 PM

## 2022-11-06 NOTE — Progress Notes (Signed)
Physical Therapy Treatment Patient Details Name:  MRN: [id number] DOB: 01-19-1928 Today's Date: 11/06/2022   History of Present Illness Pt is a 89 y/o female admitted for hypoxia in setting of COVID-19 infection and acute on chronic CHF. PMH: a fib, cHF, PE, R toe amputation, HTN, hiatal hernia, thyroid disease.    PT Comments    Pt greeted supine in bed, sleeping but easily woken and agreeable to session with slow but steady progress towards acute goals. Pt able to complete bed mobility and transfer to stand with RW support with min guard assist for safety with pt demonstrating improved initiation of all mobility and improved motor planning with less cues needed overall for sequencing. Pt able to perform short gait bout in room with RW support and min assist to steady and manage RW. Current plan continues to remain appropriate to address deficits and maximize functional independence and decrease caregiver burden. Pt continues to benefit from skilled PT services to progress toward functional mobility goals.     Recommendations for follow up therapy are one component of a multi-disciplinary discharge planning process, led by the attending physician.  Recommendations may be updated based on patient status, additional functional criteria and insurance authorization.  Follow Up Recommendations  Skilled nursing-short term rehab (<3 hours/day) Can patient physically be transported by private vehicle: Yes   Assistance Recommended at Discharge Frequent or constant Supervision/Assistance  Patient can return home with the following A lot of help with walking and/or transfers;A lot of help with bathing/dressing/bathroom;Assistance with cooking/housework;Assistance with feeding;Help with stairs or ramp for entrance;Assist for transportation;Direct supervision/assist for medications management;Direct supervision/assist for financial management   Equipment Recommendations  None recommended by PT     Recommendations for Other Services       Precautions / Restrictions Precautions Precautions: Fall Precaution Comments: monitor O2 (does not wear at baseline); urine incontinence Restrictions Weight Bearing Restrictions: No     Mobility  Bed Mobility Overal bed mobility: Needs Assistance Bed Mobility: Supine to Sit     Supine to sit: Min guard     General bed mobility comments: min gaurd for safety pt able to initiate mobility this date and elevate trunk without assist    Transfers Overall transfer level: Needs assistance Equipment used: Rolling walker (2 wheels) Transfers: Sit to/from Stand, Bed to chair/wheelchair/BSC Sit to Stand: Min guard           General transfer comment: min gaurd to power up to stand from EOBx1 and recliner x1    Ambulation/Gait Ambulation/Gait assistance: Min assist Gait Distance (Feet): 5 Feet Assistive device: Rolling walker (2 wheels) Gait Pattern/deviations: Step-through pattern, Decreased stride length, Trunk flexed Gait velocity: decr     General Gait Details: very flexed trunk with R forearm resting on RW, slow steps in room wth minmal foot clearance   Stairs             Wheelchair Mobility    Modified Rankin (Stroke Patients Only)       Balance Overall balance assessment: Needs assistance Sitting-balance support: Feet supported, No upper extremity supported Sitting balance-Leahy Scale: Fair     Standing balance support: Bilateral upper extremity supported, During functional activity, Reliant on assistive device for balance Standing balance-Leahy Scale: Poor Standing balance comment: heavy reliance on external support                            Cognition Arousal/Alertness: Awake/alert Behavior During Therapy: WFL for tasks assessed/performed,  Flat affect Overall Cognitive Status: Impaired/Different from baseline Area of Impairment: Orientation, Attention, Memory, Following commands,  Safety/judgement, Awareness, Problem solving                 Orientation Level: Disoriented to, Time, Situation Current Attention Level: Sustained Memory: Decreased short-term memory Following Commands: Follows one step commands with increased time Safety/Judgement: Decreased awareness of safety, Decreased awareness of deficits Awareness: Intellectual Problem Solving: Slow processing, Requires verbal cues, Requires tactile cues General Comments: Pt w/ decreased awareness of needs, current abilities and what assist needed.        Exercises      General Comments        Pertinent Vitals/Pain Pain Assessment Pain Assessment: Faces Faces Pain Scale: Hurts a little bit Pain Location: generalized Pain Descriptors / Indicators: Sore Pain Intervention(s): Monitored during session, Limited activity within patient's tolerance    Home Living                          Prior Function            PT Goals (current goals can now be found in the care plan section) Acute Rehab PT Goals Patient Stated Goal: None stated PT Goal Formulation: Patient unable to participate in goal setting Time For Goal Achievement: 11/11/22 Progress towards PT goals: Progressing toward goals    Frequency    Min 2X/week      PT Plan      Co-evaluation              AM-PAC PT "6 Clicks" Mobility   Outcome Measure  Help needed turning from your back to your side while in a flat bed without using bedrails?: A Little Help needed moving from lying on your back to sitting on the side of a flat bed without using bedrails?: A Lot Help needed moving to and from a bed to a chair (including a wheelchair)?: A Lot Help needed standing up from a chair using your arms (e.g., wheelchair or bedside chair)?: A Lot Help needed to walk in hospital room?: A Lot Help needed climbing 3-5 steps with a railing? : Total 6 Click Score: 12    End of Session   Activity Tolerance: Patient limited by  fatigue Patient left: with call bell/phone within reach;in chair Nurse Communication: Mobility status PT Visit Diagnosis: Unsteadiness on feet (R26.81);Pain Pain - part of body:  (L heel)     Time: [id number] PT Time Calculation (min) (ACUTE ONLY): 22 min  Charges:  $Therapeutic Activity: 8-22 mins                     [name] R. PTA Acute Rehabilitation Services Office: [place] 11/06/2022, 10:54 AM

## 2022-11-06 NOTE — Progress Notes (Signed)
ANTICOAGULATION CONSULT NOTE - Follow Up Consult  Pharmacy Consult for Warfarin Indication: atrial fibrillation and hx PE  Patient Measurements: Height: '5\' 5"'$  (165.1 cm) Weight: 84.2 kg (185 lb 10 oz) IBW/kg (Calculated) : 57  Vital Signs: Temp: 98 F (36.7 C) (02/29 0532) Temp Source: Oral (02/29 0532) BP: 98/51 (02/29 0532) Pulse Rate: 80 (02/29 0532)  Labs: Recent Labs    11/04/22 0421 11/04/22 [id number] 11/04/22 0826 11/05/22 0132 11/05/22 0757 11/06/22 0416  HGB  --  10.9*   < >  --  10.6* 10.2*  HCT  --  34.9*  --   --  34.2* 32.7*  PLT  --  103*  --   --  118* 126*  LABPROT 17.9*  --   --  19.9*  --  25.2*  INR 1.5*  --   --  1.7*  --  2.3*  CREATININE  --  1.33*  --   --  1.26*  --    < > = values in this interval not displayed.     Estimated Creatinine Clearance: 30.5 mL/min (A) (by C-G formula based on SCr of 1.26 mg/dL (H)).  Assessment: 89 yr old female admitted for hypoxia in the setting of COVID and anasarca. Pharmacy consulted for continuation of PTA Warfarin for atrial fibrillation and hx PE. 2/18 warfarin resumed.  PTA warfarin regimen: 2 mg MWF, 1 mg TTSS  INR therapeutic at 2.3. Hgb 10-11s stable, chronic thrombocytopenia - plts low, stable. Received Lovenox '80mg'$  SQ x2 doses (2/27 and 2/28) as bridge for subtherapeutic INR.   Goal of Therapy:  INR 2-3 Monitor platelets by anticoagulation protocol: Yes  Plan:  Warfarin 1 mg PO x 1 Monitor daily INR, CBC  Dimple Nanas, PharmD, BCPS 11/06/2022 7:22 AM

## 2022-11-06 NOTE — Progress Notes (Signed)
PROGRESS NOTE  [name] [id number] DOB: Dec 11, 1927   PCP: Saintclair Halsted, FNP  Patient is from: Home.  Lives with daughter.  DOA: 10/23/2022 LOS: 24  Chief complaints Chief Complaint  Patient presents with   Edema    BIB EMS from home for bilateral edema in lower legs, seeing wound MD for wounds on legs, hx of fluid on her lungs, no hx of COPD, hx of controlled afib, no complaints of SOB at current, VSS, reports daughter currently has covid   Shortness of Breath     Brief Narrative / Interim history: 89 yr old female with PMH permanent A-fib/PE on warfarin,, diastolic CHF, [id number], dysphagia, HTN, GERD, hypothyroidism who was brought to the hospital with worsening shortness of breath and edema. Patient resides at home with her daughter who was noted to have COVID-19. On testing after admission, patient was found to be positive for COVID and started on treatment.  Patient does hospitalized for acute hypoxic respiratory failure in the setting of CHF exacerbation, COVID-19 infection and severe aortic stenosis.  Patient completed treatment course with 3 days of remdesivir and 10 days of Decadron for COVID-19 infection.  Evaluated by cardiology about severe aortic stenosis and deemed to be not a candidate for surgical intervention.  Cardiology to follow outpatient.  Patient is stable for discharge pending SNF bed.  Subjective: Seen and examined earlier this morning.  No major events overnight of this morning.  She is sleepy but wakes to voice.  Oriented to self and "hospital".  She is new to the area.  No complaints.  She denies pain today.  Objective: Vitals:   11/04/22 2021 11/05/22 0500 11/05/22 0504 11/05/22 0944  BP: 119/69  130/74 122/65  Pulse: 70  73 65  Resp: '18  18 18  '$ Temp: 97.6 F (36.4 C)  97.9 F (36.6 C) 97.9 F (36.6 C)  TempSrc: Oral  Oral Oral  SpO2: (!) 88%  95% 95%  Weight:  84.7 kg    Height:        Examination:  GENERAL: Appears frail.  No  apparent distress. HEENT: MMM.  Vision and hearing grossly intact.  NECK: Supple.  No apparent JVD.  RESP:  No IWOB.  Fair aeration bilaterally. CVS:  RRR. Heart sounds normal.  ABD/GI/GU: BS+. Abd soft, NTND.  MSK/EXT:   No apparent deformity. Moves extremities but globally weak. SKIN: no apparent skin lesion or wound NEURO: Sleepy but wakes to voice.  Oriented to self and "hospital".  No apparent focal neuro deficit. PSYCH: Calm. Normal affect.   Procedures:  None  Microbiology summarized: 2/15-COVID-19 PCR positive.  Assessment and plan: Principal Problem:   COVID-19 Active Problems:   Hypoxia   Acute on chronic diastolic CHF (congestive heart failure) (HCC)   Chronic kidney disease, stage 3b (HCC)   Severe aortic stenosis   Chronic thromboembolic pulmonary hypertension (HCC)   Permanent atrial fibrillation (HCC)   Personal history of pulmonary embolism   Pneumonia due to COVID-19 virus  Acute respiratory failure with hypoxia-multifactorial including CHF exacerbation, severe aortic stenosis and COVID-19 infection.  Improving. -Treat treatable causes  -Wean oxygen as able, encourage IS, -OOB/PT/OT   Acute on chronic diastolic CHF: Presents with dyspnea, DOE, edema.  Had pulmonary edema with pleural effusion, crackles and BLE edema on presentation.  BNP elevated.  TTE with severe aortic stenosis and LVEF of 60 to 65% and moderately enlarged RV and severe LAE and RAE but no RWMA.  Reportedly allergic to furosemide.  She is on Bumex 1 mg twice daily at home.  Still on 2 L.  1+ BLE edema. -Decrease Bumex to 1 mg twice daily.  Soft blood pressures. -Continue Aldactone to 12.5 mg daily given persistent hyponatremia -Strict intake and output, daily weight and renal functions.   Severe aortic stenosis: No murmur on exam -Deemed to be poor TAVR candidate but cardiology will follow outpatient with limited echo  Permanent A-fib/history of PE with PAH: On warfarin.  INR therapeutic  today. -Warfarin per pharmacy.     CKD-3B: Stable. Recent Labs    10/29/22 0301 10/30/22 0311 10/31/22 0410 11/01/22 0720 11/01/22 1827 11/02/22 0340 11/02/22 1812 11/03/22 0713 11/04/22 0826 11/05/22 0757  BUN '20 23 23 18 21 18 '$ 34* 43* 44* 36*  CREATININE 1.36* 1.49* 1.40* 1.25* 1.20* 1.23* 1.23* 1.26* 1.33* 1.26*  -Decrease diuretics as above.  Possible cognitive impairment:  oriented to self and "hospital".  She is from [place].  New to the area. -Reorientation and delirium precaution -Minimize or avoid sedating medications   Left leg pain/left heel pain: X-ray shows severe osteoarthritis.  No acute fracture. -Continue scheduled Tylenol and Voltaren gel. -Continue gabapentin.  Severe PAD: Right ABI 0.48 (previously 0.52).  Left ABI 0.46 (previously 0.9).  -Outpatient follow-up with vascular surgery recommended.  Patient follows up with Dr. Scot Dock outpatient  Hyponatremia: could be due to fluid overload, CKD, and Aldactone. -Diuretics as above -Decreased Aldactone -Recheck in the morning  Hypothyroidism -Continue home Synthroid.  Constipation. -Bowel regimen  Obesity Body mass index is 31.07 kg/m.  Pressure skin injury: POA Pressure Injury 10/24/22 Pretibial Right Deep Tissue Pressure Injury - Purple or maroon localized area of discolored intact skin or blood-filled blister due to damage of underlying soft tissue from pressure and/or shear. (Active)  10/24/22 1341  Location: Pretibial  Location Orientation: Right  Staging: Deep Tissue Pressure Injury - Purple or maroon localized area of discolored intact skin or blood-filled blister due to damage of underlying soft tissue from pressure and/or shear.  Wound Description (Comments):   Present on Admission: Yes  Dressing Type Foam - Lift dressing to assess site every shift 11/05/22 0731     Pressure Injury 10/24/22 Heel Left Deep Tissue Pressure Injury - Purple or maroon localized area of discolored intact skin  or blood-filled blister due to damage of underlying soft tissue from pressure and/or shear. (Active)  10/24/22 1343  Location: Heel  Location Orientation: Left  Staging: Deep Tissue Pressure Injury - Purple or maroon localized area of discolored intact skin or blood-filled blister due to damage of underlying soft tissue from pressure and/or shear.  Wound Description (Comments):   Present on Admission: Yes  Dressing Type Foam - Lift dressing to assess site every shift 11/05/22 0731     Pressure Injury 10/24/22 Heel Right Deep Tissue Pressure Injury - Purple or maroon localized area of discolored intact skin or blood-filled blister due to damage of underlying soft tissue from pressure and/or shear. (Active)  10/24/22 1343  Location: Heel  Location Orientation: Right  Staging: Deep Tissue Pressure Injury - Purple or maroon localized area of discolored intact skin or blood-filled blister due to damage of underlying soft tissue from pressure and/or shear.  Wound Description (Comments):   Present on Admission: Yes  Dressing Type Foam - Lift dressing to assess site every shift 11/05/22 0731     Pressure Injury 10/24/22 Sacrum Deep Tissue Pressure Injury - Purple or maroon localized area of discolored intact skin or blood-filled blister due  to damage of underlying soft tissue from pressure and/or shear. (Active)  10/24/22 1343  Location: Sacrum  Location Orientation:   Staging: Deep Tissue Pressure Injury - Purple or maroon localized area of discolored intact skin or blood-filled blister due to damage of underlying soft tissue from pressure and/or shear.  Wound Description (Comments):   Present on Admission: Yes  Dressing Type Foam - Lift dressing to assess site every shift 11/05/22 0731   DVT prophylaxis:   warfarin (COUMADIN) tablet 2 mg  Code Status: DNR/DNI Family Communication: None at the bedside Level of care: Telemetry Medical Status is: Inpatient Remains inpatient appropriate  because: Safe disposition.   Final disposition: SNF Consultants:  Cardiology  35 minutes with more than 50% spent in reviewing records, counseling patient/family and coordinating care.   Sch Meds:  Scheduled Meds:  acetaminophen  1,000 mg Oral TID   bumetanide  2 mg Oral BID   cholecalciferol  1,000 Units Oral Daily   cyanocobalamin  1,000 mcg Oral Daily   diclofenac Sodium  4 g Topical QID   feeding supplement  237 mL Oral TID BM   gabapentin  300 mg Oral BID   levothyroxine  100 mcg Oral Q0600   lidocaine  1 patch Transdermal QHS   mupirocin cream   Topical Daily   pantoprazole  40 mg Oral Daily   polyethylene glycol  17 g Oral Daily   polyvinyl alcohol  1 drop Both Eyes BID   senna-docusate  1 tablet Oral BID   sodium chloride flush  3 mL Intravenous Q12H   [START ON 11/06/2022] spironolactone  12.5 mg Oral Daily   warfarin  2 mg Oral ONCE-1600   Warfarin - Pharmacist Dosing Inpatient   Does not apply q1600   Continuous Infusions:  sodium chloride     PRN Meds:.sodium chloride, acetaminophen, ondansetron (ZOFRAN) IV, sodium chloride flush  Antimicrobials: Anti-infectives (From admission, onward)    Start     Dose/Rate Route Frequency Ordered Stop   10/25/22 1000  remdesivir 100 mg in sodium chloride 0.9 % 100 mL IVPB       See Hyperspace for full Linked Orders Report.   100 mg 200 mL/hr over 30 Minutes Intravenous Daily 10/23/22 2302 10/26/22 1049   10/24/22 0200  remdesivir 200 mg in sodium chloride 0.9% 250 mL IVPB       See Hyperspace for full Linked Orders Report.   200 mg 580 mL/hr over 30 Minutes Intravenous Once 10/23/22 2302 10/24/22 0309        I have personally reviewed the following labs and images: CBC: Recent Labs  Lab 10/30/22 0311 11/01/22 0720 11/03/22 0713 11/04/22 0826 11/05/22 0757  WBC 3.2* 4.4 5.4 5.6 6.3  NEUTROABS 2.6 3.5  --  3.9  --   HGB 11.0* 11.2* 11.3* 10.9* 10.6*  HCT 34.2* 35.4* 35.6* 34.9* 34.2*  MCV 84.2 85.1 85.8  86.6 86.4  PLT 87* 95* 109* 103* 118*   BMP &GFR Recent Labs  Lab 10/30/22 0311 10/31/22 0410 11/02/22 0340 11/02/22 1812 11/03/22 0713 11/04/22 0826 11/05/22 0757  NA 129*   < > 127* 128* 127* 129* 129*  K 4.3   < > 3.8 3.7 4.0 4.0 4.4  CL 82*   < > 80* 80* 79* 81* 81*  CO2 37*   < > 37* 38* 40* 41* 36*  GLUCOSE 113*   < > 118* 128* 98 106* 87  BUN 23   < > 18 34* 43* 44* 36*  CREATININE 1.49*   < > 1.23* 1.23* 1.26* 1.33* 1.26*  CALCIUM 9.1   < > 8.7* 9.0 9.0 9.0 8.9  MG 1.8  --   --   --   --  2.0 2.1  PHOS  --   --   --   --   --   --  3.3   < > = values in this interval not displayed.   Estimated Creatinine Clearance: 30.6 mL/min (A) (by C-G formula based on SCr of 1.26 mg/dL (H)). Liver & Pancreas: Recent Labs  Lab 11/04/22 0826 11/05/22 0757  AST 32  --   ALT 18  --   ALKPHOS 96  --   BILITOT 1.3*  --   PROT 5.6*  --   ALBUMIN 2.4* 2.4*   No results for input(s): "LIPASE", "AMYLASE" in the last 168 hours. No results for input(s): "AMMONIA" in the last 168 hours. Diabetic: No results for input(s): "HGBA1C" in the last 72 hours. No results for input(s): "GLUCAP" in the last 168 hours. Cardiac Enzymes: No results for input(s): "CKTOTAL", "CKMB", "CKMBINDEX", "TROPONINI" in the last 168 hours. No results for input(s): "PROBNP" in the last 8760 hours. Coagulation Profile: Recent Labs  Lab 10/31/22 0410 11/01/22 0720 11/03/22 0713 11/04/22 0421 11/05/22 0132  INR 2.7* 2.5* 1.6* 1.5* 1.7*   Thyroid Function Tests: No results for input(s): "TSH", "T4TOTAL", "FREET4", "T3FREE", "THYROIDAB" in the last 72 hours. Lipid Profile: No results for input(s): "CHOL", "HDL", "LDLCALC", "TRIG", "CHOLHDL", "LDLDIRECT" in the last 72 hours. Anemia Panel: No results for input(s): "VITAMINB12", "FOLATE", "FERRITIN", "TIBC", "IRON", "RETICCTPCT" in the last 72 hours. Urine analysis:    Component Value Date/Time   COLORURINE YELLOW 11/15/2021 1743   APPEARANCEUR CLEAR  11/15/2021 1743   LABSPEC 1.012 11/15/2021 1743   PHURINE 5.0 11/15/2021 1743   GLUCOSEU NEGATIVE 11/15/2021 1743   HGBUR NEGATIVE 11/15/2021 1743   BILIRUBINUR NEGATIVE 11/15/2021 1743   [place] 11/15/2021 1743   PROTEINUR 30 (A) 11/15/2021 1743   NITRITE NEGATIVE 11/15/2021 1743   LEUKOCYTESUR NEGATIVE 11/15/2021 1743   Sepsis Labs: Invalid input(s): "PROCALCITONIN", "LACTICIDVEN"  Microbiology: No results found for this or any previous visit (from the past 240 hour(s)).  Radiology Studies: VAS [place] ABI WITH/WO TBI  Result Date: 11/04/2022  LOWER EXTREMITY DOPPLER STUDY Patient Name:  [name]  Date of Exam:   11/04/2022 Medical Rec #: [id number]      Accession #:    [id number] Date of Birth: 06-01-1928       Patient Gender: F Patient Age:   33 years Exam Location:  [facility] Procedure:      VAS [place] ABI WITH/WO TBI Referring Phys: PRANAV PATEL --------------------------------------------------------------------------------  Indications: leg pain High Risk Factors: Hypertension, hyperlipidemia, past history of smoking. Other Factors: Afib, CKD,.  Performing Technologist: Rogelia Rohrer RVT, RDMS  Examination Guidelines: A complete evaluation includes at minimum, Doppler waveform signals and systolic blood pressure reading at the level of bilateral brachial, anterior tibial, and posterior tibial arteries, when vessel segments are accessible. Bilateral testing is considered an integral part of a complete examination. Photoelectric Plethysmograph (PPG) waveforms and toe systolic pressure readings are included as required and additional duplex testing as needed. Limited examinations for reoccurring indications may be performed as noted.  ABI Findings: +---------+------------------+-----+----------+--------+ Right    Rt Pressure (mmHg)IndexWaveform  Comment  +---------+------------------+-----+----------+--------+ Brachial 159  triphasic           +---------+------------------+-----+----------+--------+ PTA      77                0.48 monophasic         +---------+------------------+-----+----------+--------+ DP       76                0.48 monophasic         +---------+------------------+-----+----------+--------+ Great Toe62                0.39 Abnormal           +---------+------------------+-----+----------+--------+ +---------+------------------+-----+----------+-------+ Left     Lt Pressure (mmHg)IndexWaveform  Comment +---------+------------------+-----+----------+-------+ Brachial 145                    biphasic          +---------+------------------+-----+----------+-------+ PTA      73                0.46 monophasic        +---------+------------------+-----+----------+-------+ DP       73                0.46 monophasic        +---------+------------------+-----+----------+-------+ Great Toe34                0.21 Abnormal          +---------+------------------+-----+----------+-------+ +-------+-----------+-----------+------------+------------+ ABI/TBIToday's ABIToday's TBIPrevious ABIPrevious TBI +-------+-----------+-----------+------------+------------+ Right  0.48       0.39       0.52        0            +-------+-----------+-----------+------------+------------+ Left   0.46       0.21       0.93        0.38         +-------+-----------+-----------+------------+------------+  Summary: Right: Resting right ankle-brachial index indicates severe right lower extremity arterial disease. The right toe-brachial index is abnormal. Left: Resting left ankle-brachial index indicates severe left lower extremity arterial disease. The left toe-brachial index is abnormal. *See table(s) above for measurements and observations.  Electronically signed by Deitra Mayo MD on 11/04/2022 at 7:32:57 PM.    Final    DG Foot Complete Left  Result Date: 11/04/2022 CLINICAL DATA:  Left foot pain  EXAM: LEFT FOOT - COMPLETE 3+ VIEW COMPARISON:  None Available. FINDINGS: Frontal, oblique, and lateral views of the left foot are obtained. Hammertoe deformities are noted. No acute displaced fracture. Mild diffuse osteoarthritis greatest at the tarsometatarsal joints and first metatarsophalangeal joint. Soft tissues are unremarkable. IMPRESSION: 1. Mild diffuse osteoarthritis. 2. Hammertoe deformities. 3. No acute fracture. Electronically Signed   By: Randa Ngo M.D.   On: 11/04/2022 18:28      Deontrae Drinkard T. [place]  If 7PM-7AM, please contact night-coverage [email or web address] 11/05/2022, 1:57 PM

## 2022-11-07 DIAGNOSIS — R0902 Hypoxemia: Secondary | ICD-10-CM | POA: Diagnosis not present

## 2022-11-07 DIAGNOSIS — I5033 Acute on chronic diastolic (congestive) heart failure: Secondary | ICD-10-CM | POA: Diagnosis not present

## 2022-11-07 DIAGNOSIS — U071 COVID-19: Secondary | ICD-10-CM | POA: Diagnosis not present

## 2022-11-07 DIAGNOSIS — I071 Rheumatic tricuspid insufficiency: Secondary | ICD-10-CM | POA: Insufficient documentation

## 2022-11-07 DIAGNOSIS — N1832 Chronic kidney disease, stage 3b: Secondary | ICD-10-CM | POA: Diagnosis not present

## 2022-11-07 LAB — CBC
HCT: 33.9 % — ABNORMAL LOW (ref 36.0–46.0)
Hemoglobin: 10.4 g/dL — ABNORMAL LOW (ref 12.0–15.0)
MCH: 26.7 pg (ref 26.0–34.0)
MCHC: 30.7 g/dL (ref 30.0–36.0)
MCV: 87.1 fL (ref 80.0–100.0)
Platelets: 149 10*3/uL — ABNORMAL LOW (ref 150–400)
RBC: 3.89 MIL/uL (ref 3.87–5.11)
RDW: 16.9 % — ABNORMAL HIGH (ref 11.5–15.5)
WBC: 6.3 10*3/uL (ref 4.0–10.5)
nRBC: 0 % (ref 0.0–0.2)

## 2022-11-07 LAB — RENAL FUNCTION PANEL
Albumin: 2.6 g/dL — ABNORMAL LOW (ref 3.5–5.0)
Anion gap: 11 (ref 5–15)
BUN: 33 mg/dL — ABNORMAL HIGH (ref 8–23)
CO2: 36 mmol/L — ABNORMAL HIGH (ref 22–32)
Calcium: 9.1 mg/dL (ref 8.9–10.3)
Chloride: 80 mmol/L — ABNORMAL LOW (ref 98–111)
Creatinine, Ser: 1.28 mg/dL — ABNORMAL HIGH (ref 0.44–1.00)
GFR, Estimated: 39 mL/min — ABNORMAL LOW (ref >60)
Glucose, Bld: 89 mg/dL (ref 70–99)
Phosphorus: 3.5 mg/dL (ref 2.5–4.6)
Potassium: 4.2 mmol/L (ref 3.5–5.1)
Sodium: 127 mmol/L — ABNORMAL LOW (ref 135–145)

## 2022-11-07 LAB — PROTIME-INR
INR: 2.4 — ABNORMAL HIGH (ref 0.8–1.2)
Prothrombin Time: 25.9 seconds — ABNORMAL HIGH (ref 11.4–15.2)

## 2022-11-07 LAB — MAGNESIUM: Magnesium: 2.2 mg/dL (ref 1.7–2.4)

## 2022-11-07 MED ORDER — BUMETANIDE 2 MG PO TABS
2.0000 mg | ORAL_TABLET | Freq: Every morning | ORAL | Status: DC
  Administered 2022-11-07: 2 mg via ORAL
  Filled 2022-11-07: qty 1

## 2022-11-07 MED ORDER — BUMETANIDE 1 MG PO TABS
1.0000 mg | ORAL_TABLET | Freq: Every day | ORAL | Status: DC
  Administered 2022-11-07: 1 mg via ORAL
  Filled 2022-11-07: qty 1

## 2022-11-07 MED ORDER — BUMETANIDE 1 MG PO TABS: ORAL_TABLET | ORAL | Status: DC

## 2022-11-07 MED ORDER — WARFARIN SODIUM 2 MG PO TABS
2.0000 mg | ORAL_TABLET | Freq: Once | ORAL | Status: DC
  Filled 2022-11-07: qty 1

## 2022-11-07 NOTE — Progress Notes (Addendum)
Mobility Specialist Progress Note   11/07/22 1122  Mobility  Activity Transferred from bed to chair  Level of Assistance Minimal assist, patient does 75% or more  Assistive Device Front wheel walker  Distance Ambulated (ft) 2 ft  Activity Response Tolerated well  Mobility Referral Yes  $Mobility charge 1 Mobility   Pt found in bed reluctant to mobility expressing tiredness but agreeable after encouragement. MinG to EOB and MinA to stand and pivot to chair. Pt requiring verbal cues on proper RW mechanics, pt tends to lean on the R side of the RW when ambulating. Left in chair w/o fault, call bell in reach and chair alarm on.    Holland Falling Mobility Specialist Please contact via SecureChat or  Rehab office at [phone]

## 2022-11-07 NOTE — Progress Notes (Signed)
ANTICOAGULATION CONSULT NOTE - Follow Up Consult  Pharmacy Consult for Warfarin Indication: atrial fibrillation and hx PE  Patient Measurements: Height: '5\' 5"'$  (165.1 cm) Weight: 80.3 kg (177 lb 0.5 oz) IBW/kg (Calculated) : 57  Vital Signs: Temp: 98.6 F (37 C) (03/01 0845) Temp Source: Oral (03/01 0845) BP: 118/64 (03/01 0845) Pulse Rate: 73 (03/01 0845)  Labs: Recent Labs    11/05/22 0132 11/05/22 0757 11/05/22 0757 11/06/22 0416 11/06/22 0843 11/07/22 0820  HGB  --  10.6*   < > 10.2*  --  10.4*  HCT  --  34.2*  --  32.7*  --  33.9*  PLT  --  118*  --  126*  --  149*  LABPROT 19.9*  --   --  25.2*  --  25.9*  INR 1.7*  --   --  2.3*  --  2.4*  CREATININE  --  1.26*  --   --  1.34* 1.28*   < > = values in this interval not displayed.     Estimated Creatinine Clearance: 29.4 mL/min (A) (by C-G formula based on SCr of 1.28 mg/dL (H)).  Assessment: 89 yr old female admitted for hypoxia in the setting of COVID and anasarca. Pharmacy consulted for continuation of PTA Warfarin for atrial fibrillation and hx PE. 2/18 warfarin resumed.  PTA warfarin regimen: 2 mg MWF, 1 mg TTSS  INR therapeutic at 2.4. Hgb 10-11s stable, chronic thrombocytopenia - plts low, stable.  Goal of Therapy:  INR 2-3 Monitor platelets by anticoagulation protocol: Yes  Plan:  Warfarin 2 mg PO x 1 (home regimen) Monitor daily INR, CBC  Dimple Nanas, PharmD, BCPS 11/07/2022 10:38 AM

## 2022-11-07 NOTE — Progress Notes (Signed)
Report given to [place] at [facility]. Patient waiting for PTAR at this time.

## 2022-11-07 NOTE — TOC Progression Note (Signed)
Transition of Care [facility]) - Initial/Assessment Note    Patient Details  Name: [name] MRN: [id number] Date of Birth: December 20, 1927  [facility] CM/SW Contact:    Milinda Antis, [place] Phone Number: 11/07/2022, 8:37 AM  Clinical Narrative:                 LCSW contacted [facility] to inquire about the status of insurance authorization and is awaiting a response.  TOC following.    Barriers to Discharge: Continued Medical Work up   Patient Goals and CMS Choice Patient states their goals for this hospitalization and ongoing recovery are:: To return home CMS Medicare.gov Compare Post Acute Care list provided to:: Patient Choice offered to / list presented to : Patient, Adult Children (Daughter, [name])      Expected Discharge Plan and Services   Discharge Planning Services: CM Consult   Living arrangements for the past 2 months: Single Family Home                                      Prior Living Arrangements/Services Living arrangements for the past 2 months: Single Family Home Lives with:: Adult Children (Daughter, [name]) Patient language and need for interpreter reviewed:: Yes Do you feel safe going back to the place where you live?: Yes      Need for Family Participation in Patient Care: Yes (Comment) Care giver support system in place?: Yes (comment)   Criminal Activity/Legal Involvement Pertinent to Current Situation/Hospitalization: No - Comment as needed  Activities of Daily Living Home Assistive Devices/Equipment: Walker (specify type) ADL Screening (condition at time of admission) Patient's cognitive ability adequate to safely complete daily activities?: Yes Is the patient deaf or have difficulty hearing?: No Does the patient have difficulty seeing, even when wearing glasses/contacts?: No Does the patient have difficulty concentrating, remembering, or making decisions?: Yes Patient able to express need for assistance with ADLs?:  Yes Does the patient have difficulty dressing or bathing?: Yes Independently performs ADLs?: No Communication: Independent Dressing (OT): Needs assistance Is this a change from baseline?: Change from baseline, expected to last >3 days Grooming: Needs assistance Is this a change from baseline?: Change from baseline, expected to last >3 days Feeding: Independent Bathing: Needs assistance Is this a change from baseline?: Change from baseline, expected to last >3 days Toileting: Needs assistance Is this a change from baseline?: Change from baseline, expected to last >3days In/Out Bed: Dependent Is this a change from baseline?: Change from baseline, expected to last >3 days Walks in Home: Needs assistance Is this a change from baseline?: Change from baseline, expected to last >3 days Does the patient have difficulty walking or climbing stairs?: Yes Weakness of Legs: Both Weakness of Arms/Hands: Both  Permission Sought/Granted Permission sought to share information with : Case Manager, Family Supports Permission granted to share information with : Yes, Verbal Permission Granted              Emotional Assessment Appearance:: Appears stated age Attitude/Demeanor/Rapport: Engaged, Gracious Affect (typically observed): Accepting, Appropriate, Calm, Hopeful, Pleasant Orientation: : Oriented to Self Alcohol / Substance Use: Not Applicable Psych Involvement: No (comment)  Admission diagnosis:  Anasarca [R60.1] Peripheral edema [R60.9] Acute respiratory failure with hypoxia (HCC) [J96.01] Pneumonia due to COVID-19 virus [U07.1, J12.82] COVID-19 [U07.1] Patient Active Problem List   Diagnosis Date Noted   Severe aortic stenosis 10/26/2022   Pneumonia due to  COVID-19 virus 10/24/2022   COVID-19 10/23/2022   Hypoxia 10/23/2022   Positive blood culture 11/20/2021   Generalized weakness 11/20/2021   Thrombocytopenia ([place]) 11/17/2021   Anemia of chronic disease 11/17/2021   AKI on  CKD-3B 11/17/2021   Osteomyelitis ([place]) 11/16/2021   Acute on chronic diastolic CHF (congestive heart failure) ([place]) 11/16/2021   Hyponatremia 11/16/2021   Chronic kidney disease, stage 3b ([place]) 11/16/2021   Leukocytosis 11/16/2021   Uncontrolled hypertension 11/15/2021   Hyperthyroidism 11/15/2021   Peripheral venous insufficiency 11/15/2021   Personal history of pulmonary embolism 11/15/2021   Osteomyelitis of second toe of right foot ([place]) 11/15/2021   Cellulitis 11/15/2021   Chronic thromboembolic pulmonary hypertension ([place]) 05/12/2014   Permanent atrial fibrillation ([place]) 06/20/2009   Chronic obstructive lung disease ([place]) 04/04/2009   Gastroesophageal reflux disease 12/25/2008   Generalized anxiety disorder 12/25/2008   Hypercholesterolemia 12/25/2008   Hypothyroidism 12/25/2008   PCP:  Saintclair Halsted, FNP Pharmacy:   [id number] [id number]: [phone]: [phone]    Social Determinants of Health (SDOH) Social History: SDOH Screenings   Food Insecurity: Food Insecurity Present (10/27/2022)  Housing: Low Risk  (10/27/2022)  Transportation Needs: No Transportation Needs (10/27/2022)  Utilities: Not At Risk (10/27/2022)  Tobacco Use: Medium Risk (10/23/2022)   SDOH Interventions:     Readmission Risk Interventions    11/18/2021   12:55 PM  Readmission Risk Prevention Plan  Transportation Screening Complete  PCP or Specialist Appt within 3-5 Days Complete  HRI or HPortalComplete  Social Work Consult for [place]Counseling Complete  Palliative Care Screening Complete  Medication Review (Press photographer Complete

## 2022-11-07 NOTE — TOC Transition Note (Addendum)
Transition of Care [facility]) - CM/SW Discharge Note   Patient Details  Name: ROSALYN LAUR MRN: [id number] Date of Birth: November 03, 1927  Transition of Care [facility]) CM/SW Contact:  Milinda Antis, [place] Phone Number: 11/07/2022, 1:14 PM   Clinical Narrative:    Patient will DC to: [facility] Anticipated DC date: 11/07/2022 Family notified: Yes, daughter [name] via Stage manager by: Corey Harold   Per MD patient ready for DC to SNF. RN to call report prior to discharge [id number] room 410B). RN,  patient's family, and facility notified of DC. Discharge Summary and FL2 sent to facility. DC packet on chart. Ambulance transport will be requested for patient.   CSW will sign off for now as social work intervention is no longer needed. Please consult [place] again if new needs arise.  14:32-  LCSW received a call from the patient's daughter requesting to speak with MD.  MD and floor RN notified.    Final next level of care: Skilled Nursing Facility Barriers to Discharge: Barriers Resolved   Patient Goals and CMS Choice CMS Medicare.gov Compare Post Acute Care list provided to:: Patient Choice offered to / list presented to : Patient, Adult Children (Daughter, [name])  Discharge Placement                Patient chooses bed at:  [facility]) Patient to be transferred to facility by: [facility] Name of family member notified: Algie Coffer (Daughter) [phone] Patient and family notified of of transfer: 11/07/22  Discharge Plan and Services Additional resources added to the After Visit Summary for     Discharge Planning Services: CM Consult                                 Social Determinants of Health (SDOH) Interventions SDOH Screenings   Food Insecurity: Food Insecurity Present (10/27/2022)  Housing: Low Risk  (10/27/2022)  Transportation Needs: No Transportation Needs (10/27/2022)  Utilities: Not At Risk (10/27/2022)  Tobacco Use: Medium Risk (10/23/2022)      Readmission Risk Interventions    11/18/2021   12:55 PM  Readmission Risk Prevention Plan  Transportation Screening Complete  PCP or Specialist Appt within 3-5 Days Complete  HRI or [place] Complete  Social Work Consult for [place] Planning/Counseling Complete  Palliative Care Screening Complete  Medication Review Press photographer) Complete

## 2022-11-07 NOTE — Discharge Summary (Addendum)
Physician Discharge Summary  SOFI DIETSCH [id number] DOB: [birth year] DOA: 10/23/2022  PCP: Saintclair Halsted, FNP  Admit date: 10/23/2022 Discharge date: 11/07/2022 Admitted From: Home Disposition: SNF Recommendations for Outpatient Follow-up:  Follow up as below. Reassess fluid status, blood pressure, CMP and CBC and adjust diuretics/meds as appropriate Continue goal of care discussion and/or referral to palliative care. Please follow up on the following pending results: None  Discharge Condition: Stable but guarded prognosis CODE STATUS: DNR/DNI  Contact information for follow-up providers     Angelia Mould, MD. Schedule an appointment as soon as possible for a visit in 2 week(s).   Specialties: Vascular Surgery, Cardiology Contact information: [address] [place] [address] [place] [facility]. Schedule an appointment as soon as possible for a visit in 3 week(s).   Specialty: Cardiology Contact information: [address] [place] [id number] [name] [phone]             Contact information for after-discharge care     Destination     [facility] .   Service: Skilled Nursing Contact information: [address] Test Update Address [place] [facility] course 89 yr old female with PMH permanent A-fib/PE on warfarin,, diastolic CHF, [id number], dysphagia, HTN, GERD, hypothyroidism who was brought to the hospital with worsening shortness of breath and edema. Patient resides at home with her daughter who was noted to have COVID-19. On testing after admission, patient was found to be positive for COVID and started on treatment.  Patient does hospitalized for acute hypoxic respiratory failure in the setting of CHF exacerbation, COVID-19 infection and severe aortic stenosis.   Patient  completed treatment course with 3 days of remdesivir and 10 days of Decadron for COVID-19 infection.   In regards to CHF, TTE with LVEF of 60 to 65%, indeterminate DD, severe LAE and RAE, severe AVR and TVR.  Evaluated by cardiology about severe aortic stenosis and deemed to be not a candidate for surgical intervention.  Patient has allergies to Lasix and diuresed with Bumex and Aldactone.  She is discharged on Bumex 2 mg in the morning and 1 mg in the afternoon and Aldactone 12.5 mg daily.  Reassess fluid status, blood pressure and BMP in about a week and adjust diuretics/meds as appropriate.  Cardiology to arrange outpatient follow-up.  Overall, patient has guarded prognosis given her age and advanced disease process.  Continue goal of care discussion   Patient is stable for discharge pending SNF bed.  See individual problem list below for more.   Problems addressed during this hospitalization Principal Problem:   COVID-19 Active Problems:   Hypoxia   Acute on chronic diastolic CHF (congestive heart failure) (HCC)   Chronic kidney disease, stage 3b (HCC)   Severe aortic stenosis   Chronic thromboembolic pulmonary hypertension (HCC)   Permanent atrial fibrillation (HCC)   Personal history of pulmonary embolism   Pneumonia due to COVID-19 virus   Acute respiratory failure with hypoxia-multifactorial including CHF exacerbation, severe aortic stenosis and COVID-19 infection.  Resolved. -Encourage incentive from injury -Aspiration precaution   Acute on chronic diastolic CHF: Presents with dyspnea, DOE, edema.  Had pulmonary edema with pleural effusion, crackles and BLE edema on presentation.  BNP elevated.  TTE with severe aortic and  tricuspid stenosis and LVEF of 60 to 65% and moderately enlarged RV and severe LAE and RAE but no RWMA.  Reportedly allergic to furosemide.  She is on Bumex 1 mg twice daily at home.  Diuresed with increased dose of Bumex.  Net -12 L.  Weight is unreliable.   Discharge weight 177 pounds. -Discharged on Bumex 2 mg in the morning and 1 mg in the afternoon. -Aldactone 12.5 mg daily -Reassess fluid status, blood pressure and renal function in 1 week and adjust as appropriate -Cardiology to arrange outpatient follow-up -Recommend ongoing goals of care discussion   Severe aortic stenosis/severe tricuspid stenosis: No murmur on exam -Deemed to be poor TAVR candidate but cardiology will follow outpatient with limited echo   Permanent A-fib/history of PE with PAH: On warfarin.  INR therapeutic today. -Continue warfarin.   CKD-3B: Stable. -Recheck in 1 week.   Possible cognitive impairment:  oriented to self and "hospital".  She is from [place].  New to the area. -Reorientation and delirium precaution -Minimize or avoid sedating medications   Left leg pain/left heel pain: X-ray shows severe osteoarthritis.  No acute fracture. -Continue Tylenol, gabapentin and Voltaren gel.   Severe PAD: Right ABI 0.48 (previously 0.52).  Left ABI 0.46 (previously 0.9).  -Outpatient follow-up with vascular surgery recommended.  Patient follows up with Dr. Scot Dock outpatient   Hyponatremia: could be due to fluid overload, CKD, and Aldactone. -Decreased Aldactone to 12.5 mg daily -Increased Bumex to 2 mg in the morning.  Continue 1 mg in the afternoon.   Hypothyroidism -Continue home Synthroid.   Constipation. -Bowel regimen   Obesity Body mass index is 29.46 kg/m.  Pressure skin injury: POA Pressure Injury 10/24/22 Pretibial Right Deep Tissue Pressure Injury - Purple or maroon localized area of discolored intact skin or blood-filled blister due to damage of underlying soft tissue from pressure and/or shear. (Active)  10/24/22 1341  Location: Pretibial  Location Orientation: Right  Staging: Deep Tissue Pressure Injury - Purple or maroon localized area of discolored intact skin or blood-filled blister due to damage of underlying soft tissue from pressure  and/or shear.  Wound Description (Comments):   Present on Admission: Yes  Dressing Type Foam - Lift dressing to assess site every shift 11/07/22 1100     Pressure Injury 10/24/22 Heel Left Deep Tissue Pressure Injury - Purple or maroon localized area of discolored intact skin or blood-filled blister due to damage of underlying soft tissue from pressure and/or shear. (Active)  10/24/22 1343  Location: Heel  Location Orientation: Left  Staging: Deep Tissue Pressure Injury - Purple or maroon localized area of discolored intact skin or blood-filled blister due to damage of underlying soft tissue from pressure and/or shear.  Wound Description (Comments):   Present on Admission: Yes  Dressing Type Foam - Lift dressing to assess site every shift 11/07/22 1100     Pressure Injury 10/24/22 Heel Right Deep Tissue Pressure Injury - Purple or maroon localized area of discolored intact skin or blood-filled blister due to damage of underlying soft tissue from pressure and/or shear. (Active)  10/24/22 1343  Location: Heel  Location Orientation: Right  Staging: Deep Tissue Pressure Injury - Purple or maroon localized area of discolored intact skin or blood-filled blister due to damage of underlying soft tissue from pressure and/or shear.  Wound Description (Comments):   Present on Admission: Yes  Dressing Type Foam - Lift dressing to assess site every shift 11/07/22 1100     Pressure Injury 10/24/22 Sacrum  Deep Tissue Pressure Injury - Purple or maroon localized area of discolored intact skin or blood-filled blister due to damage of underlying soft tissue from pressure and/or shear. (Active)  10/24/22 1343  Location: Sacrum  Location Orientation:   Staging: Deep Tissue Pressure Injury - Purple or maroon localized area of discolored intact skin or blood-filled blister due to damage of underlying soft tissue from pressure and/or shear.  Wound Description (Comments):   Present on Admission: Yes  Dressing  Type Foam - Lift dressing to assess site every shift 11/07/22 1100    Vital signs Vitals:   11/06/22 2121 11/07/22 0500 11/07/22 0524 11/07/22 0845  BP: (!) 99/57  129/66 118/64  Pulse: 78  67 73  Temp: 98.4 F (36.9 C)  99.6 F (37.6 C) 98.6 F (37 C)  Resp: '19  19 17  '$ Height:      Weight:  80.3 kg    SpO2: 97%  97% 97%  TempSrc: Oral  Oral Oral  BMI (Calculated):  29.46       Discharge exam  GENERAL: No apparent distress.  Nontoxic. HEENT: MMM.  Vision and hearing grossly intact.  NECK: Supple.  No apparent JVD.  RESP:  No IWOB.  Fair aeration bilaterally. CVS:  RRR. Heart sounds normal.  ABD/GI/GU: BS+. Abd soft, NTND.  MSK/EXT:  Moves extremities. No apparent deformity.  1+ BLE edema. SKIN: no apparent skin lesion or wound NEURO: Awake and alert. Oriented to self and "hospital".  No apparent focal neuro deficit. PSYCH: Calm. Normal affect.   Discharge Instructions Discharge Instructions     Diet - low sodium heart healthy   Complete by: As directed    Discharge wound care:   Complete by: As directed    10/24/22 1456    Wound care  Daily      Comments: Apply Bactroban to left 4th toe and right great toe/right 3rd toe callous areas Q day, may leave open to air Recommend offloading both heels when she is in bed.   Increase activity slowly   Complete by: As directed       Allergies as of 11/07/2022       Reactions   Amoxicillin Other (See Comments)   Unknown reaction    Azithromycin Other (See Comments)   Unknown reaction    Codeine Other (See Comments)   Unknown reaction    Erythromycin Other (See Comments)   Unknown reaction    Green Dyes Other (See Comments)   Allergic to ALL dyes   Iodine Other (See Comments)   Unknown reaction    Misc. Sulfonamide Containing Compounds    Oxycodone Other (See Comments)   Unknown reaction    Oxycodone-acetaminophen Other (See Comments)   Unknown reaction    Penicillins Other (See Comments)   Unknown reaction     Sulfasalazine    Tramadol Hives        Medication List     STOP taking these medications    clotrimazole-betamethasone cream Commonly known as: LOTRISONE   metoprolol succinate 50 MG 24 hr tablet Commonly known as: TOPROL-XL   PREVAGEN PO       TAKE these medications    acetaminophen 325 MG tablet Commonly known as: TYLENOL Take 2 tablets (650 mg total) by mouth every 6 (six) hours as needed for mild pain or headache. What changed:  when to take this reasons to take this   bumetanide 1 MG tablet Commonly known as: BUMEX Take 2 mg at 8 am and  1 mg at 3 pm What changed:  how much to take how to take this when to take this additional instructions   carboxymethylcellulose 0.5 % Soln Commonly known as: REFRESH PLUS Place 1 drop into both eyes 2 (two) times daily.   cholecalciferol 25 MCG (1000 UNIT) tablet Commonly known as: VITAMIN D3 Take 1,000 Units by mouth daily.   cyanocobalamin 1000 MCG tablet Commonly known as: VITAMIN B12 Take 1,000 mcg by mouth daily.   diclofenac Sodium 1 % Gel Commonly known as: VOLTAREN Apply 4 g topically 4 (four) times daily. To bilateral feet and knees   feeding supplement Liqd Take 237 mLs by mouth 3 (three) times daily between meals.   gabapentin 300 MG capsule Commonly known as: NEURONTIN Take 300 mg by mouth 2 (two) times daily.   levothyroxine 100 MCG tablet Commonly known as: SYNTHROID Take 100 mcg by mouth every morning.   pantoprazole 40 MG tablet Commonly known as: PROTONIX Take 40 mg by mouth daily.   polyethylene glycol powder 17 GM/SCOOP powder Commonly known as: MiraLax Take 17 g by mouth 2 (two) times daily as needed for moderate constipation.   PreserVision/Lutein Caps Take 1 capsule by mouth 2 (two) times daily.   senna-docusate 8.6-50 MG tablet Commonly known as: Senokot-S Take 1 tablet by mouth 2 (two) times daily between meals as needed for mild constipation.   spironolactone 25 MG  tablet Commonly known as: ALDACTONE Take 0.5 tablets (12.5 mg total) by mouth daily.   warfarin 1 MG tablet Commonly known as: COUMADIN Take 1-2 mg by mouth See admin instructions. Take '1mg'$  by mouth on Tues, Thur, Saturday & Sunday, Then take  2 mg on Monday, Wednesday, Friday.               Discharge Care Instructions  (From admission, onward)           Start     Ordered   11/07/22 0000  Discharge wound care:       Comments: 10/24/22 1456    Wound care  Daily      Comments: Apply Bactroban to left 4th toe and right great toe/right 3rd toe callous areas Q day, may leave open to air Recommend offloading both heels when she is in bed.   11/07/22 1222            Consultations: Cardiology  Procedures/Studies:   VAS [place] ABI WITH/WO TBI  Result Date: 11/04/2022  [place] STUDY Patient Name:  NASYAH GOTH  Date of Exam:   11/04/2022 Medical Rec #: [id number]      Accession #:    [id number] Date of Birth: July 08, 1928       Patient Gender: F Patient Age:   89 years Exam Location:  [facility] Procedure:      VAS [place] ABI WITH/WO TBI Referring Phys: PRANAV PATEL --------------------------------------------------------------------------------  Indications: leg pain High Risk Factors: Hypertension, hyperlipidemia, past history of smoking. Other Factors: Afib, CKD,.  Performing Technologist: Rogelia Rohrer RVT, RDMS  Examination Guidelines: A complete evaluation includes at minimum, Doppler waveform signals and systolic blood pressure reading at the level of bilateral brachial, anterior tibial, and posterior tibial arteries, when vessel segments are accessible. Bilateral testing is considered an integral part of a complete examination. Photoelectric Plethysmograph (PPG) waveforms and toe systolic pressure readings are included as required and additional duplex testing as needed. Limited examinations for reoccurring indications may be performed as noted.  ABI Findings:  +---------+------------------+-----+----------+--------+ Right  Rt Pressure (mmHg)IndexWaveform  Comment  +---------+------------------+-----+----------+--------+ Brachial 159                    triphasic          +---------+------------------+-----+----------+--------+ PTA      77                0.48 monophasic         +---------+------------------+-----+----------+--------+ DP       76                0.48 monophasic         +---------+------------------+-----+----------+--------+ Great Toe62                0.39 Abnormal           +---------+------------------+-----+----------+--------+ +---------+------------------+-----+----------+-------+ Left     Lt Pressure (mmHg)IndexWaveform  Comment +---------+------------------+-----+----------+-------+ Brachial 145                    biphasic          +---------+------------------+-----+----------+-------+ PTA      73                0.46 monophasic        +---------+------------------+-----+----------+-------+ DP       73                0.46 monophasic        +---------+------------------+-----+----------+-------+ Great Toe34                0.21 Abnormal          +---------+------------------+-----+----------+-------+ +-------+-----------+-----------+------------+------------+ ABI/TBIToday's ABIToday's TBIPrevious ABIPrevious TBI +-------+-----------+-----------+------------+------------+ Right  0.48       0.39       0.52        0            +-------+-----------+-----------+------------+------------+ Left   0.46       0.21       0.93        0.38         +-------+-----------+-----------+------------+------------+  Summary: Right: Resting right ankle-brachial index indicates severe right lower extremity arterial disease. The right toe-brachial index is abnormal. Left: Resting left ankle-brachial index indicates severe left lower extremity arterial disease. The left toe-brachial index is  abnormal. *See table(s) above for measurements and observations.  Electronically signed by Deitra Mayo MD on 11/04/2022 at 7:32:57 PM.    Final    DG Foot Complete Left  Result Date: 11/04/2022 CLINICAL DATA:  Left foot pain EXAM: LEFT FOOT - COMPLETE 3+ VIEW COMPARISON:  None Available. FINDINGS: Frontal, oblique, and lateral views of the left foot are obtained. Hammertoe deformities are noted. No acute displaced fracture. Mild diffuse osteoarthritis greatest at the tarsometatarsal joints and first metatarsophalangeal joint. Soft tissues are unremarkable. IMPRESSION: 1. Mild diffuse osteoarthritis. 2. Hammertoe deformities. 3. No acute fracture. Electronically Signed   By: Randa Ngo M.D.   On: 11/04/2022 18:28   DG Knee Left Port  Result Date: 11/03/2022 CLINICAL DATA:  Left knee pain. EXAM: PORTABLE LEFT KNEE - 1-2 VIEW COMPARISON:  None Available. FINDINGS: There is diffuse decreased bone mineralization. Severe medial compartment joint space narrowing with bone-on-bone contact and peripheral osteophytosis. Moderate peripheral lateral compartment degenerative osteophytosis. Mild lateral compartment chondrocalcinosis. Severe patellofemoral joint space narrowing with moderate peripheral degenerative osteophytosis. Small joint effusion. No acute fracture or dislocation. High-grade atherosclerotic calcifications. IMPRESSION: 1. Severe medial and patellofemoral compartment osteoarthritis. 2. Small joint effusion. Electronically Signed   By: Jori Moll  Viola M.D.   On: 11/03/2022 12:31   DG CHEST PORT 1 VIEW  Result Date: 11/01/2022 CLINICAL DATA:  141880 SOB (shortness of breath) 141880 [id number] COVID [id number] EXAM: PORTABLE CHEST 1 VIEW COMPARISON:  10/25/2022 mid FINDINGS: Stable cardiomegaly. Aortic atherosclerosis. Persistently coarsened interstitial markings bilaterally. Small left pleural effusion. No pneumothorax. Severe degenerative changes of both glenohumeral joints. IMPRESSION:  Persistently coarsened interstitial markings bilaterally may represent bronchitic lung changes, edema, or viral infection. Small left pleural effusion. Overall, appearance is similar to the previous study. Electronically Signed   By: Davina Poke D.O.   On: 11/01/2022 11:03   VAS [place] LOWER EXTREMITY VENOUS (DVT)  Result Date: 10/30/2022  Lower Venous DVT Study Patient Name:  NATAHLIA ADDEO Janney  Date of Exam:   10/30/2022 Medical Rec #: [id number]      Accession #:    [id number] Date of Birth: [birth year]       Patient Gender: F Patient Age:   61 years Exam Location:  [facility] Procedure:      VAS [place] LOWER EXTREMITY VENOUS (DVT) Referring Phys: PRANAV PATEL --------------------------------------------------------------------------------  Indications: Edema, SOB, and COVID-19.  Anticoagulation: Coumadin. Comparison Study: Previous study 06/22/20 negative. Performing Technologist: Candie Chroman  Examination Guidelines: A complete evaluation includes B-mode imaging, spectral Doppler, color Doppler, and power Doppler as needed of all accessible portions of each vessel. Bilateral testing is considered an integral part of a complete examination. Limited examinations for reoccurring indications may be performed as noted. The reflux portion of the exam is performed with the patient in reverse Trendelenburg.  +---------+---------------+---------+-----------+----------+--------------+ RIGHT    CompressibilityPhasicitySpontaneityPropertiesThrombus Aging +---------+---------------+---------+-----------+----------+--------------+ CFV      Full           Yes      Yes                                 +---------+---------------+---------+-----------+----------+--------------+ SFJ      Full                                                        +---------+---------------+---------+-----------+----------+--------------+ FV Prox  Full                                                         +---------+---------------+---------+-----------+----------+--------------+ FV Mid   Full                                                        +---------+---------------+---------+-----------+----------+--------------+ FV DistalFull                                                        +---------+---------------+---------+-----------+----------+--------------+ PFV      Full                                                        +---------+---------------+---------+-----------+----------+--------------+  POP      Full           Yes      Yes                                 +---------+---------------+---------+-----------+----------+--------------+ PTV      Full                                                        +---------+---------------+---------+-----------+----------+--------------+ PERO     Full                                                        +---------+---------------+---------+-----------+----------+--------------+   +---------+---------------+---------+-----------+----------+--------------+ LEFT     CompressibilityPhasicitySpontaneityPropertiesThrombus Aging +---------+---------------+---------+-----------+----------+--------------+ CFV      Full           Yes      Yes                                 +---------+---------------+---------+-----------+----------+--------------+ SFJ      Full                                                        +---------+---------------+---------+-----------+----------+--------------+ FV Prox  Full                                                        +---------+---------------+---------+-----------+----------+--------------+ FV Mid   Full                                                        +---------+---------------+---------+-----------+----------+--------------+ FV DistalFull                                                         +---------+---------------+---------+-----------+----------+--------------+ PFV      Full                                                        +---------+---------------+---------+-----------+----------+--------------+ POP      Full           Yes      Yes                                 +---------+---------------+---------+-----------+----------+--------------+  PTV      Full                                                        +---------+---------------+---------+-----------+----------+--------------+ PERO     Full                                                        +---------+---------------+---------+-----------+----------+--------------+     Summary: BILATERAL: - No evidence of deep vein thrombosis seen in the lower extremities, bilaterally. - No evidence of superficial venous thrombosis in the lower extremities, bilaterally. -No evidence of popliteal cyst, bilaterally.   *See table(s) above for measurements and observations. Electronically signed by Monica Martinez MD on 10/30/2022 at 2:26:17 PM.    Final    ECHOCARDIOGRAM COMPLETE BUBBLE STUDY  Result Date: 10/25/2022    ECHOCARDIOGRAM REPORT   Patient Name:   ILMA LIMBACH Haber Date of Exam: 10/25/2022 Medical Rec #:  [id number]     Height:       65.0 in Accession #:    [id number]    Weight:       182.0 lb Date of Birth:  06-28-1928      BSA:          1.900 m Patient Age:    55 years      BP:           140/104 mmHg Patient Gender: F             HR:           66 bpm. Exam Location:  Inpatient Procedure: 2D Echo, Cardiac Doppler, Color Doppler and [place] Contrast Bubble            Study Indications:    CHF  History:        Patient has prior history of Echocardiogram examinations, most                 recent 11/10/2017. CHF, Pulmonary HTN; Arrythmias:Atrial                 Fibrillation. CKD, stage 3.  Sonographer:    Ronny Flurry Referring Phys: [id number] AVA SWAYZE IMPRESSIONS  1. Apical doppler suggests severe AS though signal could  be contaminated; suggest FU limited study with further doppler interrogation of AS.  2. Left ventricular ejection fraction, by estimation, is 60 to 65%. The left ventricle has normal function. The left ventricle has no regional wall motion abnormalities. There is mild concentric left ventricular hypertrophy. Left ventricular diastolic function could not be evaluated. Elevated left atrial pressure.  3. Right ventricular systolic function is moderately reduced. The right ventricular size is moderately enlarged. There is severely elevated pulmonary artery systolic pressure.  4. Left atrial size was severely dilated.  5. Right atrial size was severely dilated.  6. A small pericardial effusion is present.  7. The mitral valve is normal in structure. Mild mitral valve regurgitation. No evidence of mitral stenosis.  8. Tricuspid valve regurgitation is severe.  9. The aortic valve is tricuspid. Aortic valve regurgitation is mild. Severe aortic valve stenosis. 10. The inferior vena cava is dilated in size with <50% respiratory  variability, suggesting right atrial pressure of 15 mmHg. 11. Agitated saline contrast bubble study was negative, with no evidence of any interatrial shunt. FINDINGS  Left Ventricle: Left ventricular ejection fraction, by estimation, is 60 to 65%. The left ventricle has normal function. The left ventricle has no regional wall motion abnormalities. The left ventricular internal cavity size was normal in size. There is  mild concentric left ventricular hypertrophy. Left ventricular diastolic function could not be evaluated due to atrial fibrillation. Left ventricular diastolic function could not be evaluated. Elevated left atrial pressure. Right Ventricle: The right ventricular size is moderately enlarged. Right ventricular systolic function is moderately reduced. There is severely elevated pulmonary artery systolic pressure. The tricuspid regurgitant velocity is 3.62 m/s, and with an assumed right  atrial pressure of 15 mmHg, the estimated right ventricular systolic pressure is 0000000 mmHg. Left Atrium: Left atrial size was severely dilated. Right Atrium: Right atrial size was severely dilated. Pericardium: A small pericardial effusion is present. Mitral Valve: The mitral valve is normal in structure. Mild mitral annular calcification. Mild mitral valve regurgitation. No evidence of mitral valve stenosis. Tricuspid Valve: The tricuspid valve is normal in structure. Tricuspid valve regurgitation is severe. No evidence of tricuspid stenosis. Aortic Valve: The aortic valve is tricuspid. Aortic valve regurgitation is mild. Severe aortic stenosis is present. Aortic valve mean gradient measures 39.0 mmHg. Aortic valve peak gradient measures 26.1 mmHg. Aortic valve area, by VTI measures 1.10 cm. Pulmonic Valve: The pulmonic valve was normal in structure. Pulmonic valve regurgitation is trivial. No evidence of pulmonic stenosis. Aorta: The aortic root is normal in size and structure. Venous: The inferior vena cava is dilated in size with less than 50% respiratory variability, suggesting right atrial pressure of 15 mmHg. IAS/Shunts: No atrial level shunt detected by color flow Doppler. Agitated saline contrast was given intravenously to evaluate for intracardiac shunting. Agitated saline contrast bubble study was negative, with no evidence of any interatrial shunt. Additional Comments: Apical doppler suggests severe AS though signal could be contaminated; suggest FU limited study with further doppler interrogation of AS.  LEFT VENTRICLE PLAX 2D LVIDd:         3.70 cm   Diastology LVIDs:         2.30 cm   LV e' medial:    7.83 cm/s LV PW:         1.70 cm   LV E/e' medial:  20.4 LV IVS:        1.30 cm   LV e' lateral:   10.70 cm/s LVOT diam:     1.80 cm   LV E/e' lateral: 14.9 LV SV:         72 LV SV Index:   38 LVOT Area:     2.54 cm  RIGHT VENTRICLE            IVC RV S prime:     9.57 cm/s  IVC diam: 3.70 cm TAPSE  (M-mode): 2.1 cm LEFT ATRIUM              Index        RIGHT ATRIUM           Index LA diam:        5.90 cm  3.10 cm/m   RA Area:     41.20 cm LA Vol (A2C):   115.0 ml 60.51 ml/m  RA Volume:   169.00 ml 88.93 ml/m LA Vol (A4C):   67.6 ml  35.57 ml/m LA Biplane Vol:  89.3 ml  46.99 ml/m  AORTIC VALVE AV Area (Vmax):    1.48 cm AV Area (Vmean):   1.34 cm AV Area (VTI):     1.10 cm AV Vmax:           255.67 cm/s AV Vmean:          190.667 cm/s AV VTI:            0.655 m AV Peak Grad:      26.1 mmHg AV Mean Grad:      39.0 mmHg LVOT Vmax:         149.00 cm/s LVOT Vmean:        100.233 cm/s LVOT VTI:          0.284 m LVOT/AV VTI ratio: 0.43  AORTA Ao Root diam: 3.60 cm Ao Asc diam:  3.60 cm MV E velocity: 159.50 cm/s  TRICUSPID VALVE                             TR Peak grad:   52.4 mmHg                             TR Vmax:        362.00 cm/s                              SHUNTS                             Systemic VTI:  0.28 m                             Systemic Diam: 1.80 cm Kirk Ruths MD Electronically signed by Kirk Ruths MD Signature Date/Time: 10/25/2022/4:57:36 PM    Final    DG CHEST PORT 1 VIEW  Result Date: 10/25/2022 CLINICAL DATA:  COVID-19 infection.  Shortness of breath. EXAM: PORTABLE CHEST 1 VIEW COMPARISON:  Single-view of the chest 10/23/2022 and PA and lateral chest 10/23/2022. 0 FINDINGS: Marked cardiomegaly. There is interstitial pulmonary edema. Small bilateral pleural effusions and basilar airspace disease are worse on the left. Aortic atherosclerosis noted. No pneumothorax. IMPRESSION: No change in interstitial edema with small pleural effusions and basilar airspace disease, worse on the left. Marked cardiomegaly. Electronically Signed   By: Inge Rise M.D.   On: 10/25/2022 08:58   DG Chest Portable 1 View  Result Date: 10/23/2022 CLINICAL DATA:  Shortness of breath EXAM: PORTABLE CHEST 1 VIEW COMPARISON:  10/10/2022 FINDINGS: Diffuse cardiac enlargement. Pulmonary  vascular congestion. Perihilar and basilar interstitial changes likely edema. Small bilateral pleural effusions. There is mild progression since prior study. No pneumothorax. Apical pleural calcifications, likely postinflammatory. Calcification of the aorta. Degenerative changes in the shoulders. IMPRESSION: Cardiac enlargement with pulmonary vascular congestion, interstitial edema, and small bilateral pleural effusions, progressing since prior study. Electronically Signed   By: Lucienne Capers M.D.   On: 10/23/2022 21:51   DG Chest 2 View  Result Date: 10/10/2022 CLINICAL DATA:  Low oxygen EXAM: CHEST - 2 VIEW COMPARISON:  Chest x-ray 10/18/2021 FINDINGS: The heart is enlarged. There central pulmonary vascular congestion. There are minimal atelectatic changes in the lung bases. There is a trace left pleural effusion. No pneumothorax. No acute fractures. Degenerative changes affect the  shoulders. IMPRESSION: 1. Cardiomegaly with central pulmonary vascular congestion. 2. Trace left pleural effusion. Electronically Signed   By: Ronney Asters M.D.   On: 10/10/2022 16:24       The results of significant diagnostics from this hospitalization (including imaging, microbiology, ancillary and laboratory) are listed below for reference.     Microbiology: No results found for this or any previous visit (from the past 240 hour(s)).   Labs:  CBC: Recent Labs  Lab 11/01/22 0720 11/03/22 0713 11/04/22 0826 11/05/22 0757 11/06/22 0416 11/07/22 0820  WBC 4.4 5.4 5.6 6.3 6.6 6.3  NEUTROABS 3.5  --  3.9  --   --   --   HGB 11.2* 11.3* 10.9* 10.6* 10.2* 10.4*  HCT 35.4* 35.6* 34.9* 34.2* 32.7* 33.9*  MCV 85.1 85.8 86.6 86.4 86.1 87.1  PLT 95* 109* 103* 118* 126* 149*   BMP &GFR Recent Labs  Lab 11/03/22 0713 11/04/22 0826 11/05/22 0757 11/06/22 0843 11/07/22 0820  NA 127* 129* 129* 126* 127*  K 4.0 4.0 4.4 4.2 4.2  CL 79* 81* 81* 79* 80*  CO2 40* 41* 36* 38* 36*  GLUCOSE 98 106* 87 79 89   BUN 43* 44* 36* 34* 33*  CREATININE 1.26* 1.33* 1.26* 1.34* 1.28*  CALCIUM 9.0 9.0 8.9 8.9 9.1  MG  --  2.0 2.1 2.1 2.2  PHOS  --   --  3.3 3.7 3.5   Estimated Creatinine Clearance: 29.4 mL/min (A) (by C-G formula based on SCr of 1.28 mg/dL (H)). Liver & Pancreas: Recent Labs  Lab 11/04/22 0826 11/05/22 0757 11/06/22 0843 11/07/22 0820  AST 32  --   --   --   ALT 18  --   --   --   ALKPHOS 96  --   --   --   BILITOT 1.3*  --   --   --   PROT 5.6*  --   --   --   ALBUMIN 2.4* 2.4* 2.6* 2.6*   No results for input(s): "LIPASE", "AMYLASE" in the last 168 hours. No results for input(s): "AMMONIA" in the last 168 hours. Diabetic: No results for input(s): "HGBA1C" in the last 72 hours. Recent Labs  Lab 11/06/22 1643  GLUCAP 163*   Cardiac Enzymes: No results for input(s): "CKTOTAL", "CKMB", "CKMBINDEX", "TROPONINI" in the last 168 hours. No results for input(s): "PROBNP" in the last 8760 hours. Coagulation Profile: Recent Labs  Lab 11/03/22 0713 11/04/22 0421 11/05/22 0132 11/06/22 0416 11/07/22 0820  INR 1.6* 1.5* 1.7* 2.3* 2.4*   Thyroid Function Tests: No results for input(s): "TSH", "T4TOTAL", "FREET4", "T3FREE", "THYROIDAB" in the last 72 hours. Lipid Profile: No results for input(s): "CHOL", "HDL", "LDLCALC", "TRIG", "CHOLHDL", "LDLDIRECT" in the last 72 hours. Anemia Panel: No results for input(s): "VITAMINB12", "FOLATE", "FERRITIN", "TIBC", "IRON", "RETICCTPCT" in the last 72 hours. Urine analysis:    Component Value Date/Time   COLORURINE YELLOW 11/15/2021 1743   APPEARANCEUR CLEAR 11/15/2021 1743   LABSPEC 1.012 11/15/2021 1743   PHURINE 5.0 11/15/2021 1743   GLUCOSEU NEGATIVE 11/15/2021 1743   HGBUR NEGATIVE 11/15/2021 1743   BILIRUBINUR NEGATIVE 11/15/2021 1743   [place] 11/15/2021 1743   PROTEINUR 30 (A) 11/15/2021 1743   NITRITE NEGATIVE 11/15/2021 1743   LEUKOCYTESUR NEGATIVE 11/15/2021 1743   Sepsis Labs: Invalid input(s):  "PROCALCITONIN", "LACTICIDVEN"   SIGNED:  Mercy Riding, MD  [facility] 11/07/2022, 3:13 PM

## 2022-11-07 NOTE — Care Management Important Message (Signed)
Important Message  Patient Details  Name: [name] MRN: [id number] Date of Birth: 1928-03-29   Medicare Important Message Given:  Yes Patient left prior to IM delivery   Patient left prior to IM delivery will mail copy to the patient home address  Orbie Pyo 11/07/2022, 4:18 PM

## 2022-11-12 DIAGNOSIS — I4819 Other persistent atrial fibrillation: Secondary | ICD-10-CM | POA: Diagnosis not present

## 2022-11-12 DIAGNOSIS — J9601 Acute respiratory failure with hypoxia: Secondary | ICD-10-CM | POA: Diagnosis not present

## 2022-11-12 DIAGNOSIS — N1832 Chronic kidney disease, stage 3b: Secondary | ICD-10-CM

## 2022-11-12 DIAGNOSIS — I5033 Acute on chronic diastolic (congestive) heart failure: Secondary | ICD-10-CM | POA: Diagnosis not present

## 2022-11-12 DIAGNOSIS — I350 Nonrheumatic aortic (valve) stenosis: Secondary | ICD-10-CM | POA: Diagnosis not present

## 2022-11-18 ENCOUNTER — Telehealth: Payer: Self-pay | Admitting: Cardiology

## 2022-11-18 ENCOUNTER — Ambulatory Visit (HOSPITAL_BASED_OUTPATIENT_CLINIC_OR_DEPARTMENT_OTHER): Payer: Medicare HMO | Admitting: Cardiology

## 2022-11-18 DIAGNOSIS — I350 Nonrheumatic aortic (valve) stenosis: Secondary | ICD-10-CM

## 2022-11-18 NOTE — Telephone Encounter (Signed)
Pt daughter calling stating the hospital told her they wanted her to have another Echo done. Pt is now in a rehab facility and she wants to know if Dr. Harrell Gave wants to send an order to the rehab center to have it done. Please advise.

## 2022-11-18 NOTE — Telephone Encounter (Signed)
Spoke with daughter regarding follow up Echo and appointment  Daughter stated mother still trying to get her strength back and doing PT  Scheduled her follow up limited echo and post hospital same day per daughter preference Advised daughter if anything came up between now and then to call for sooner visit

## 2022-11-20 ENCOUNTER — Ambulatory Visit (HOSPITAL_BASED_OUTPATIENT_CLINIC_OR_DEPARTMENT_OTHER): Payer: Medicare HMO | Admitting: Cardiology

## 2022-12-05 ENCOUNTER — Encounter (INDEPENDENT_AMBULATORY_CARE_PROVIDER_SITE_OTHER): Payer: Medicare HMO | Admitting: Ophthalmology

## 2022-12-19 ENCOUNTER — Encounter (HOSPITAL_BASED_OUTPATIENT_CLINIC_OR_DEPARTMENT_OTHER): Payer: Self-pay | Admitting: Family

## 2022-12-19 ENCOUNTER — Ambulatory Visit (INDEPENDENT_AMBULATORY_CARE_PROVIDER_SITE_OTHER): Payer: Medicare HMO | Admitting: Family

## 2022-12-19 ENCOUNTER — Ambulatory Visit (INDEPENDENT_AMBULATORY_CARE_PROVIDER_SITE_OTHER): Payer: Medicare HMO

## 2022-12-19 VITALS — BP 136/60 | HR 72 | Ht 65.0 in | Wt 153.2 lb

## 2022-12-19 DIAGNOSIS — I10 Essential (primary) hypertension: Secondary | ICD-10-CM | POA: Diagnosis not present

## 2022-12-19 DIAGNOSIS — I350 Nonrheumatic aortic (valve) stenosis: Secondary | ICD-10-CM

## 2022-12-19 DIAGNOSIS — I5032 Chronic diastolic (congestive) heart failure: Secondary | ICD-10-CM | POA: Diagnosis not present

## 2022-12-19 DIAGNOSIS — I272 Pulmonary hypertension, unspecified: Secondary | ICD-10-CM | POA: Diagnosis not present

## 2022-12-19 LAB — ECHOCARDIOGRAM LIMITED
AR max vel: 1.08 cm2
AV Area VTI: 1.19 cm2
AV Area mean vel: 0.99 cm2
AV Mean grad: 12 mmHg
AV Peak grad: 20.1 mmHg
Ao pk vel: 2.24 m/s
Area-P 1/2: 4.68 cm2
S' Lateral: 2.05 cm

## 2022-12-19 MED ORDER — SPIRONOLACTONE 25 MG PO TABS: 12.5000 mg | ORAL_TABLET | Freq: Every day | ORAL | 3 refills | Status: DC

## 2022-12-19 NOTE — Progress Notes (Signed)
Office Visit    Patient Name: [name] Date of Encounter: 12/19/2022  PCP:  Eloisa Northern, MD   [facility]  Cardiologist:  [name], MD  Advanced Practice Provider:  No care team member to display Electrophysiologist:  None    Chief Complaint    [name] is a 89 y.o. female with a hx of  atrial fibrillation, HTN, pulmonary hypertension, chronic diastolic heart failure presents today for hospital follow up.    Past Medical History    Past Medical History:  Diagnosis Date   Anticoagulant long-term use    Arrhythmia    Arthritis    Atrial fibrillation    Congestive heart failure (CHF)    Dysphagia    Esophageal reflux    Essential hypertension    Hiatal hernia    Pulmonary embolus    Thyroid disease    Past Surgical History:  Procedure Laterality Date   AMPUTATION TOE Right 11/17/2021   Procedure: RIGHT SECOND TOE AMPUTATION;  Surgeon: [name], MD;  Location: [facility];  Service: Orthopedics;  Laterality: Right;   COLONOSCOPY     ELBOW SURGERY     LAPAROSCOPIC HYSTERECTOMY      Allergies  Allergies  Allergen Reactions   Amoxicillin Other (See Comments)    Unknown reaction    Azithromycin Other (See Comments)    Unknown reaction    Codeine Other (See Comments)    Unknown reaction    Erythromycin Other (See Comments)    Unknown reaction    Green Dyes Other (See Comments)    Allergic to ALL dyes   Iodine Other (See Comments)    Unknown reaction    Misc. Sulfonamide Containing Compounds    Oxycodone Other (See Comments)    Unknown reaction      Oxycodone-Acetaminophen Other (See Comments)    Unknown reaction    Penicillins Other (See Comments)    Unknown reaction    Sulfasalazine    Tramadol Hives    History of Present Illness    [name] is a 89 y.o. female with a hx of atrial fibrillation on Coumadin, HTN, pulmonary hypertension, chronic diastolic heart failure last seen 02/05/21.  Previously  followed by Dr. Orvis Brill at [facility]. 2015 myoview low risk study. Echo 219 hyperdynamic EF, severe RV dilation, pulmonary hypertension,. Venous study 2019 with mild insufficiency. She established with Dr. Cristal Deer in 2020.   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 Hospital course complicated by hyponatremia, AKI, uncontrolled hypertension. Her Aldactone was held for 5 days, Losartan reduced to 50mg  QD, and Bumex 1mg  BID continued.  Admitted 2/15 - 11/07/2022 for acute hypoxic respiratory failure in setting of CHF exacerbation, COVID-19 infection, severe aortic stenosis.  Treated with 3 days of remdesivir and 10 days of Decadron.  Echocardiogram LVEF 60 to 65%, indeterminate diastolic function, severe LAE and RAE, severe AVR and TVR.  Not candidate for surgical intervention.  Diuresed with Bumex and Aldactone.  Discharged on Bumex 2 mg a.m. and 1 mg afternoon, Spironolactone 12.5mg  QD. Discharge weight 177 lbs.  Presents today for follow up. Repeat limited echo after her appt today after recovery from acute illness. Weight 155 lbs on home scale and on our scale 153 lbs. Since [facility] has been taking Bumex 1mg  BID. Did have to hold Spironolactone for a few days due to hypotension but has resumed 12.5mg  QD with no recurrent hypotension. Working  with HH RN/PT to increase strength, stamina. Follows with podiatry and presently on PO abx for two infected toes.   EKGs/Labs/Other Studies Reviewed:   The following studies were reviewed today: Cardiac Studies & Procedures       ECHOCARDIOGRAM  ECHOCARDIOGRAM COMPLETE BUBBLE STUDY 10/25/2022  Narrative ECHOCARDIOGRAM REPORT    Patient Name:   [name] Date of Exam: 10/25/2022 Medical Rec #:  [id number]     Height:       65.0 in Accession #:    [id number]    Weight:       182.0 lb Date of Birth:  08/17/1928      BSA:          1.900 m Patient Age:    89 years      BP:            140/104 mmHg Patient Gender: F             HR:           66 bpm. Exam Location:  Inpatient  Procedure: 2D Echo, Cardiac Doppler, Color Doppler and Saline Contrast Bubble Study  Indications:    CHF  History:        Patient has prior history of Echocardiogram examinations, most recent 11/10/2017. CHF, Pulmonary HTN; Arrythmias:Atrial Fibrillation. CKD, stage 3.  Sonographer:    Lucendia Herrlich Referring Phys: [id number] AVA SWAYZE  IMPRESSIONS   1. Apical doppler suggests severe AS though signal could be contaminated; suggest FU limited study with further doppler interrogation of AS. 2. Left ventricular ejection fraction, by estimation, is 60 to 65%. The left ventricle has normal function. The left ventricle has no regional wall motion abnormalities. There is mild concentric left ventricular hypertrophy. Left ventricular diastolic function could not be evaluated. Elevated left atrial pressure. 3. Right ventricular systolic function is moderately reduced. The right ventricular size is moderately enlarged. There is severely elevated pulmonary artery systolic pressure. 4. Left atrial size was severely dilated. 5. Right atrial size was severely dilated. 6. A small pericardial effusion is present. 7. The mitral valve is normal in structure. Mild mitral valve regurgitation. No evidence of mitral stenosis. 8. Tricuspid valve regurgitation is severe. 9. The aortic valve is tricuspid. Aortic valve regurgitation is mild. Severe aortic valve stenosis. 10. The inferior vena cava is dilated in size with <50% respiratory variability, suggesting right atrial pressure of 15 mmHg. 11. Agitated saline contrast bubble study was negative, with no evidence of any interatrial shunt.  FINDINGS Left Ventricle: Left ventricular ejection fraction, by estimation, is 60 to 65%. The left ventricle has normal function. The left ventricle has no regional wall motion abnormalities. The left ventricular internal cavity  size was normal in size. There is mild concentric left ventricular hypertrophy. Left ventricular diastolic function could not be evaluated due to atrial fibrillation. Left ventricular diastolic function could not be evaluated. Elevated left atrial pressure.  Right Ventricle: The right ventricular size is moderately enlarged. Right ventricular systolic function is moderately reduced. There is severely elevated pulmonary artery systolic pressure. The tricuspid regurgitant velocity is 3.62 m/s, and with an assumed right atrial pressure of 15 mmHg, the estimated right ventricular systolic pressure is 67.4 mmHg.  Left Atrium: Left atrial size was severely dilated.  Right Atrium: Right atrial size was severely dilated.  Pericardium: A small pericardial effusion is present.  Mitral Valve: The mitral valve is normal in structure. Mild mitral annular calcification. Mild mitral valve regurgitation. No evidence of mitral valve stenosis.  Tricuspid Valve: The tricuspid valve is normal in structure. Tricuspid valve regurgitation is severe. No evidence of tricuspid stenosis.  Aortic Valve: The aortic valve is tricuspid. Aortic valve regurgitation is mild. Severe aortic stenosis is present. Aortic valve mean gradient measures 39.0 mmHg. Aortic valve peak gradient measures 26.1 mmHg. Aortic valve area, by VTI measures 1.10 cm.  Pulmonic Valve: The pulmonic valve was normal in structure. Pulmonic valve regurgitation is trivial. No evidence of pulmonic stenosis.  Aorta: The aortic root is normal in size and structure.  Venous: The inferior vena cava is dilated in size with less than 50% respiratory variability, suggesting right atrial pressure of 15 mmHg.  IAS/Shunts: No atrial level shunt detected by color flow Doppler. Agitated saline contrast was given intravenously to evaluate for intracardiac shunting. Agitated saline contrast bubble study was negative, with no evidence of any interatrial  shunt.  Additional Comments: Apical doppler suggests severe AS though signal could be contaminated; suggest FU limited study with further doppler interrogation of AS.   LEFT VENTRICLE PLAX 2D LVIDd:         3.70 cm   Diastology LVIDs:         2.30 cm   LV e' medial:    7.83 cm/s LV PW:         1.70 cm   LV E/e' medial:  20.4 LV IVS:        1.30 cm   LV e' lateral:   10.70 cm/s LVOT diam:     1.80 cm   LV E/e' lateral: 14.9 LV SV:         72 LV SV Index:   38 LVOT Area:     2.54 cm   RIGHT VENTRICLE            IVC RV S prime:     9.57 cm/s  IVC diam: 3.70 cm TAPSE (M-mode): 2.1 cm  LEFT ATRIUM              Index        RIGHT ATRIUM           Index LA diam:        5.90 cm  3.10 cm/m   RA Area:     41.20 cm LA Vol (A2C):   115.0 ml 60.51 ml/m  RA Volume:   169.00 ml 88.93 ml/m LA Vol (A4C):   67.6 ml  35.57 ml/m LA Biplane Vol: 89.3 ml  46.99 ml/m AORTIC VALVE AV Area (Vmax):    1.48 cm AV Area (Vmean):   1.34 cm AV Area (VTI):     1.10 cm AV Vmax:           255.67 cm/s AV Vmean:          190.667 cm/s AV VTI:            0.655 m AV Peak Grad:      26.1 mmHg AV Mean Grad:      39.0 mmHg LVOT Vmax:         149.00 cm/s LVOT Vmean:        100.233 cm/s LVOT VTI:          0.284 m LVOT/AV VTI ratio: 0.43  AORTA Ao Root diam: 3.60 cm Ao Asc diam:  3.60 cm  MV E velocity: 159.50 cm/s  TRICUSPID VALVE TR Peak grad:   52.4 mmHg TR Vmax:        362.00 cm/s  SHUNTS Systemic VTI:  0.28 m Systemic Diam:  1.80 cm  Olga Millers MD Electronically signed by Olga Millers MD Signature Date/Time: 10/25/2022/4:57:36 PM    Final             EKG: No EKG today  Recent Labs: 11/01/2022: B Natriuretic Peptide 329.2 11/04/2022: ALT 18 11/07/2022: BUN 33; Creatinine, Ser 1.28; Hemoglobin 10.4; Magnesium 2.2; Platelets 149; Potassium 4.2; Sodium 127  Recent Lipid Panel No results found for: "CHOL", "TRIG", "HDL", "CHOLHDL", "VLDL", "LDLCALC", "LDLDIRECT"  Risk  Assessment/Calculations:    CHA2DS2-VASc Score = 5   This indicates a 7.2% annual risk of stroke. The patient's score is based upon: CHF History: 1 HTN History: 1 Diabetes History: 0 Stroke History: 0 Vascular Disease History: 0 Age Score: 2 Gender Score: 1       Home Medications   Current Meds  Medication Sig   acetaminophen (TYLENOL) 325 MG tablet Take 2 tablets (650 mg total) by mouth every 6 (six) hours as needed for mild pain or headache.   bumetanide (BUMEX) 1 MG tablet Take 1 mg by mouth 2 (two) times daily.   carboxymethylcellulose (REFRESH PLUS) 0.5 % SOLN Place 1 drop into both eyes 2 (two) times daily.   cholecalciferol (VITAMIN D3) 25 MCG (1000 UT) tablet Take 1,000 Units by mouth daily.   diclofenac Sodium (VOLTAREN) 1 % GEL Apply 4 g topically 4 (four) times daily. To bilateral feet and knees   gabapentin (NEURONTIN) 300 MG capsule Take 300 mg by mouth 2 (two) times daily.    levothyroxine (SYNTHROID) 100 MCG tablet Take 100 mcg by mouth every morning.   Multiple Vitamins-Minerals (PRESERVISION/LUTEIN) CAPS Take 1 capsule by mouth 2 (two) times daily.    pantoprazole (PROTONIX) 40 MG tablet Take 40 mg by mouth daily.   polyethylene glycol powder (MIRALAX) 17 GM/SCOOP powder Take 17 g by mouth 2 (two) times daily as needed for moderate constipation.   senna-docusate (SENOKOT-S) 8.6-50 MG tablet Take 1 tablet by mouth 2 (two) times daily between meals as needed for mild constipation.   vitamin B-12 (CYANOCOBALAMIN) 1000 MCG tablet Take 1,000 mcg by mouth daily.   warfarin (COUMADIN) 1 MG tablet Take 1-2 mg by mouth as directed. Take 1 mg every Monday, Wednesday, and Friday. Take 2 mg all other days.   [DISCONTINUED] spironolactone (ALDACTONE) 25 MG tablet Take 0.5 tablets (12.5 mg total) by mouth daily.    Review of Systems      All other systems reviewed and are otherwise negative except as noted above.  Physical Exam    VS:  BP 136/60   Pulse 72   Ht 5\' 5"   (1.651 m)   Wt 153 lb 3.2 oz (69.5 kg) Comment: weighed at home yesterday (4/11)--unable to stand to weigh today  BMI 25.49 kg/m  , BMI Body mass index is 25.49 kg/m.  Wt Readings from Last 3 Encounters:  12/19/22 153 lb 3.2 oz (69.5 kg)  11/07/22 177 lb 0.5 oz (80.3 kg)  08/28/22 183 lb (83 kg)    GEN: Well nourished, well developed, in no acute distress. HEENT: normal. Neck: Supple, no JVD, carotid bruits, or masses. Cardiac: IRIR, no murmurs, rubs, or gallops. No clubbing, cyanosis.  Radials/PT 2+ and equal bilaterally.  Respiratory:  Respirations regular and unlabored, clear to auscultation bilaterally. GI: Soft, nontender, nondistended. MS: No deformity or atrophy. Skin: Warm and dry, no rash.  Bilateral lower extremity compression stockings.  RLE with nonpitting edema. Neuro:  Strength and sensation are intact. Psych: Normal affect.  Assessment & Plan  Permanent atrial fibrillation / Hypercoagulable state - Rate controlled. Continue Toprol  QD. Continue Coumadin which is managed by PCP.  Presently doing home INR check.  Denies bleeding complications.  Diastolic heart failure / PAH-grossly euvolemic on exam.  Discharge weight 177 pounds.  Weight today in clinic 153 pounds with home 855 pounds.  Continue Bumex 1 mg twice daily, spironolactone 12.5 mg daily.  Has BMP upcoming next week nephrology.  Pending renal function, careful titration of diuretics. Low sodium diet, fluid restriction <2L, and daily weights encouraged. Educated to contact our office for weight gain of 2 lbs overnight or 5 lbs in one week.   HTN - BP reasonably well controlled.  Continue present antihypertensive regimen.  CKDIII - Careful titration of diuretic and antihypertensive.  Has upcoming appointment with nephrology and BMP will be checked at that time.  Severe AS/TS - By echo during admission. Repeat echo after clinic visit today to reassess stenosis when no longer volume overloaded and post  COVID.  Disposition: Follow up  in 3-6 weeks  with [name], MD or APP.  Signed, Alver Sorrow, NP 12/19/2022, 4:30 PM [facility]

## 2022-12-19 NOTE — Patient Instructions (Addendum)
Medication Instructions:  Continue current medications.   We will consider adjusting the doses based on blood work.   *If you need a refill on your cardiac medications before your next appointment, please call your pharmacy*  Lab Work: Recommend lab work next week with either primary care or nephrology for [employer] (basic metabolic panel).  If you will reach out to [place] when you have those lab results.   Testing/Procedures: We will have the results of your echocardiogram within one week. We will reach out to you when these results are available.   Follow-Up: At [name], you and your health needs are our priority.  As part of our continuing mission to provide you with exceptional heart care, we have created designated Provider Care Teams.  These Care Teams include your primary Cardiologist (physician) and Advanced Practice Providers (APPs -  Physician Assistants and Nurse Practitioners) who all work together to provide you with the care you need, when you need it.  We recommend signing up for the patient portal called "MyChart".  Sign up information is provided on this After Visit Summary.  MyChart is used to connect with patients for Virtual Visits (Telemedicine).  Patients are able to view lab/test results, encounter notes, upcoming appointments, etc.  Non-urgent messages can be sent to your provider as well.   To learn more about what you can do with MyChart, go to [email or web address].    Your next appointment:   3-6 weeks  Provider:   [name], MD    Other Instructions  Recommend weighing daily and keeping a log. Please call our office if you have weight gain of 2 pounds overnight or 5 pounds in 1 week.   Date  Time Weight

## 2023-01-05 NOTE — Progress Notes (Signed)
[facility] - Clinic Note  01/07/2023     CHIEF COMPLAINT Patient presents for Retina Follow Up   HISTORY OF PRESENT ILLNESS: [name] is a 89 y.o. female who presents to the clinic today for:   HPI     Retina Follow Up   Patient presents with  Dry AMD.  In both eyes.  This started 9 months ago.  Duration of 9 months.  Since onset it is gradually worsening.  I, the attending physician,  performed the HPI with the patient and updated documentation appropriately.        Comments   9 month retina follow up ARMD ou and possible yag os pt is reporting she is having more trouble with seeing things are more blurred in OS        Last edited by Rennis Chris, MD on 01/07/2023  4:49 PM.    Pt is delayed to follow up due to health problems, pt had covid in February, pt was in the hospital from February 15-March 1 and then in rehab from March 1-March 29, her daughter states she complained during that time about her vision being decreased; however, pt states vision seems stable  Referring physician: Camie Patience, FNP [address] [place],  [place] [address]  HISTORICAL INFORMATION:   Selected notes from the MEDICAL RECORD NUMBER Referred by Dr. Karleen Hampshire for ret eval OU   CURRENT MEDICATIONS: Current Outpatient Medications (Ophthalmic Drugs)  Medication Sig   carboxymethylcellulose (REFRESH PLUS) 0.5 % SOLN Place 1 drop into both eyes 2 (two) times daily.   prednisoLONE acetate (PRED FORTE) 1 % ophthalmic suspension Place 1 drop into the left eye 4 (four) times daily for 7 days.   No current facility-administered medications for this visit. (Ophthalmic Drugs)   Current Outpatient Medications (Other)  Medication Sig   acetaminophen (TYLENOL) 325 MG tablet Take 2 tablets (650 mg total) by mouth every 6 (six) hours as needed for mild pain or headache.   cholecalciferol (VITAMIN D3) 25 MCG (1000 UT) tablet Take 1,000 Units by mouth daily.   diclofenac  Sodium (VOLTAREN) 1 % GEL Apply 4 g topically 4 (four) times daily. To bilateral feet and knees   gabapentin (NEURONTIN) 300 MG capsule Take 300 mg by mouth 2 (two) times daily.    levothyroxine (SYNTHROID) 100 MCG tablet Take 100 mcg by mouth every morning.   Multiple Vitamins-Minerals (PRESERVISION/LUTEIN) CAPS Take 1 capsule by mouth 2 (two) times daily.    pantoprazole (PROTONIX) 40 MG tablet Take 40 mg by mouth daily.   polyethylene glycol powder (MIRALAX) 17 GM/SCOOP powder Take 17 g by mouth 2 (two) times daily as needed for moderate constipation.   spironolactone (ALDACTONE) 25 MG tablet Take 0.5 tablets (12.5 mg total) by mouth daily.   vitamin B-12 (CYANOCOBALAMIN) 1000 MCG tablet Take 1,000 mcg by mouth daily.   warfarin (COUMADIN) 1 MG tablet Take 1-2 mg by mouth as directed. Take 1 mg every Monday, Wednesday, and Friday. Take 2 mg all other days.   bumetanide (BUMEX) 1 MG tablet Take 1 mg by mouth 2 (two) times daily.   senna-docusate (SENOKOT-S) 8.6-50 MG tablet Take 1 tablet by mouth 2 (two) times daily between meals as needed for mild constipation.   No current facility-administered medications for this visit. (Other)   REVIEW OF SYSTEMS: ROS   Positive for: Eyes Negative for: Constitutional, Gastrointestinal, Neurological, Skin, Genitourinary, Musculoskeletal, HENT, Endocrine, Cardiovascular, Respiratory, Psychiatric, Allergic/Imm, Heme/Lymph Last edited  by Etheleen Mayhew, COT on 01/07/2023  2:39 PM.      ALLERGIES Allergies  Allergen Reactions   Amoxicillin Other (See Comments)    Unknown reaction    Azithromycin Other (See Comments)    Unknown reaction    Codeine Other (See Comments)    Unknown reaction    Erythromycin Other (See Comments)    Unknown reaction    Green Dyes Other (See Comments)    Allergic to ALL dyes   Iodine Other (See Comments)    Unknown reaction    Misc. Sulfonamide Containing Compounds    Oxycodone Other (See Comments)    Unknown  reaction      Oxycodone-Acetaminophen Other (See Comments)    Unknown reaction    Penicillins Other (See Comments)    Unknown reaction    Sulfasalazine    Tramadol Hives   PAST MEDICAL HISTORY Past Medical History:  Diagnosis Date   Anticoagulant long-term use    Arrhythmia    Arthritis    Atrial fibrillation (HCC)    Congestive heart failure (CHF) (HCC)    Dysphagia    Esophageal reflux    Essential hypertension    Hiatal hernia    Pulmonary embolus (HCC)    Thyroid disease    Past Surgical History:  Procedure Laterality Date   AMPUTATION TOE Right 11/17/2021   Procedure: RIGHT SECOND TOE AMPUTATION;  Surgeon: [name], MD;  Location: [facility];  Service: Orthopedics;  Laterality: Right;   COLONOSCOPY     ELBOW SURGERY     LAPAROSCOPIC HYSTERECTOMY     FAMILY HISTORY Family History  Problem Relation Age of Onset   Stroke Mother    Cancer Brother    SOCIAL HISTORY Social History   Tobacco Use   Smoking status: Former    Packs/day: 1    Types: Cigarettes    Quit date: 10/02/2001    Years since quitting: 21.2   Smokeless tobacco: Never   Tobacco comments:    former smoker  Vaping Use   Vaping Use: Never used       OPHTHALMIC EXAM:  Base Eye Exam     Visual Acuity (Snellen - Linear)       Right Left   Dist cc 20/80 -2 20/150   Dist ph cc 20/70 NI    Correction: Glasses         Tonometry (Tonopen, 2:45 PM)       Right Left   Pressure 12 13         Pupils       Pupils Dark Light Shape React APD   Right PERRL 4 3 Round Slow None   Left PERRL 4 3 Round Slow None         Visual Fields       Left Right    Full Full         Extraocular Movement       Right Left    Full, Ortho Full, Ortho         Neuro/Psych     Oriented x3: Yes   Mood/Affect: Normal         Dilation     Both eyes: 2.5% Phenylephrine @ 2:44 PM           Slit Lamp and Fundus Exam     Slit Lamp Exam       Right Left   Lids/Lashes  Dermatochalasis - upper lid, mild Meibomian gland dysfunction Dermatochalasis - upper lid,  mild Meibomian gland dysfunction   Conjunctiva/Sclera White and quiet White and quiet   Cornea arcus, 2-3+ Punctate epithelial erosions, irregular tear film, decreased TBUT, mild central haze, 2+ guttata, 1-2+, Descemet's folds, tear film debris, well healed cataract wound arcus, 1-2+ Punctate epithelial erosions, decreased TBUT, mild central haze, 1+ guttata, no edema, tear film debris   Anterior Chamber Deep and clear, narrow temporal angle Deep and clear, narrow temporal angle   Iris Round and dilated Round and dilated   Lens PC IOL in good position with open PC PC IOL in good position, 2+ Posterior capsular opacification   Anterior Vitreous Vitreous syneresis Vitreous syneresis         Fundus Exam       Right Left   Disc 360 PPA, mld Pallor, Sharp rim mild Pallor, Sharp rim   C/D Ratio 0.3 0.3   Macula Flat, Blunted foveal reflex, RPE mottling, clumping and atrophy, Drusen, prominent GA superior and nasal to fovea -- slightly increased, No heme or edema Flat, Blunted foveal reflex, RPE mottling, clumping and atrophy, +GA -- slightly increased, No heme or edema   Vessels Vascular attenuation, mild tortuousity attenuated, Tortuous   Periphery Attached, reticular degeneration, No heme  Attached, mild reticular degeneration, No heme           Refraction     Wearing Rx       Sphere Cylinder Axis   Right -1.50 +1.50 005   Left -0.25 +1.50 175            IMAGING AND PROCEDURES  Imaging and Procedures for 01/07/2023  OCT, Retina - OU - Both Eyes       Right Eye Quality was borderline. Central Foveal Thickness: 227. Progression has worsened. Findings include normal foveal contour, no IRF, no SRF, retinal drusen , outer retinal atrophy (Mild progression of perifoveal ORA/GA -- seen best on en face image).   Left Eye Quality was borderline. Central Foveal Thickness: 333. Progression has  worsened. Findings include normal foveal contour, no IRF, no SRF, retinal drusen , intraretinal hyper-reflective material, subretinal fluid, outer retinal atrophy (Mild progression of ORA/GA greatest superior and nasal mac -- seen best on en face image).   Notes *Images captured and stored on drive  Diagnosis / Impression:  Nonexudative ARMD OU -- No IRF/SRF OU OD: Mild progression of perifoveal ORA/GA -- seen best on en face images OS: Mild progression of ORA/GA greatest superior and nasal mac -- seen best on en face images   Clinical management:  See below  Abbreviations: NFP - Normal foveal profile. CME - cystoid macular edema. PED - pigment epithelial detachment. IRF - intraretinal fluid. SRF - subretinal fluid. EZ - ellipsoid zone. ERM - epiretinal membrane. ORA - outer retinal atrophy. ORT - outer retinal tubulation. SRHM - subretinal hyper-reflective material. IRHM - intraretinal hyper-reflective material      Yag Capsulotomy - OS - Left Eye       Procedure note: YAG Capsulotomy, Left Eye  Informed consent obtained. Pre-op dilating drops (1% Topicamide and 2.5% Phenylephrine), and topical anesthesia given. Power: 7.2 mJ Shots: 20 Posterior capsulotomy in cruciate formation performed without difficulty. Patient tolerated procedure well. No complications. Rx pred forte 4 times a day for 7 days, then stop. Pt received written and verbal post laser education. Recheck in 2 weeks w/ dilated exam.            ASSESSMENT/PLAN:    ICD-10-CM   1. Advanced atrophic nonexudative age-related macular degeneration  of both eyes without subfoveal involvement  H35.3133 OCT, Retina - OU - Both Eyes    2. Essential hypertension  I10     3. Hypertensive retinopathy of both eyes  H35.033     4. Pseudophakia, both eyes  Z96.1     5. PCO (posterior capsular opacification), left  H26.492 Yag Capsulotomy - OS - Left Eye    6. Dry eyes  H04.123       1. Age related macular  degeneration, non-exudative, with +GA, both eyes (OS>OD)  - delayed f/u -- 8 mos instead of 3-4 wks for YAG / 6 mos for AMD  - focal peripapillary ORA OU  - BCVA OD 20/70 from 20/60; OS 20/150 from 20/40 -- decreased OU  - OCT shows Mild progression of ORA/GA greatest superior and nasal mac OU -- seen best on en face images  - The incidence, anatomy, and pathology of dry AMD, risk of progression, and the AREDS and AREDS 2 study including smoking risks discussed with patient.   - Cont amsler grid monitoring  - f/u 6-9 mos, DFE, OCT  2,3. Hypertensive retinopathy OU - discussed importance of tight BP control - monitor  4. Pseudophakia OU  - s/p CE/IOL OU  - IOLs in good position  - s/p YAG Cap OD (Dr. Karleen Hampshire) - monitor  5. PCO OS  - 1-2+ -- likely visually significant  - recommend YAG cap OS, 05.01.24  - pt wishes to proceed with procedure  - RBA of procedure discussed, questions answered - informed consent obtained and signed - start PF qid OS x7 days - see procedure note - f/u 2 weeks, DFE, OCT  6. Dry eyes OU  - recommend artificial tears and lubricating ointment as needed   Ophthalmic Meds Ordered this visit:  Meds ordered this encounter  Medications   prednisoLONE acetate (PRED FORTE) 1 % ophthalmic suspension    Sig: Place 1 drop into the left eye 4 (four) times daily for 7 days.    Dispense:  10 mL    Refill:  0     Return in about 2 weeks (around 01/21/2023) for f/u s/p Yag Cap OS, DFE, OCT.  There are no Patient Instructions on file for this visit.   Explained the diagnoses, plan, and follow up with the patient and they expressed understanding.  Patient expressed understanding of the importance of proper follow up care.  This document serves as a record of services personally performed by Karie Chimera, MD, PhD. It was created on their behalf by De Blanch, an ophthalmic technician. The creation of this record is the provider's dictation and/or  activities during the visit.    Electronically signed by: De Blanch, OA, 01/07/23  4:51 PM  This document serves as a record of services personally performed by Karie Chimera, MD, PhD. It was created on their behalf by Glee Arvin. Manson Passey, OA an ophthalmic technician. The creation of this record is the provider's dictation and/or activities during the visit.    Electronically signed by: Glee Arvin. Manson Passey, [place] 05.01.2024 4:51 PM   Karie Chimera, M.D., Ph.D. Diseases & Surgery of the Retina and Vitreous [facility]  I have reviewed the above documentation for accuracy and completeness, and I agree with the above. Karie Chimera, M.D., Ph.D. 01/07/23 4:55 PM  Abbreviations: M myopia (nearsighted); A astigmatism; H hyperopia (farsighted); P presbyopia; Mrx spectacle prescription;  CTL contact lenses; OD right eye; OS left eye; OU  both eyes  XT exotropia; ET esotropia; PEK punctate epithelial keratitis; PEE punctate epithelial erosions; DES dry eye syndrome; MGD meibomian gland dysfunction; ATs artificial tears; PFAT's preservative free artificial tears; NSC nuclear sclerotic cataract; PSC posterior subcapsular cataract; ERM epi-retinal membrane; PVD posterior vitreous detachment; RD retinal detachment; DM diabetes mellitus; DR diabetic retinopathy; NPDR non-proliferative diabetic retinopathy; PDR proliferative diabetic retinopathy; CSME clinically significant macular edema; DME diabetic macular edema; dbh dot blot hemorrhages; CWS cotton wool spot; POAG primary open angle glaucoma; C/D cup-to-disc ratio; HVF humphrey visual field; GVF goldmann visual field; OCT optical coherence tomography; IOP intraocular pressure; BRVO Branch retinal vein occlusion; CRVO central retinal vein occlusion; CRAO central retinal artery occlusion; BRAO branch retinal artery occlusion; RT retinal tear; SB scleral buckle; PPV pars plana vitrectomy; VH Vitreous hemorrhage; PRP panretinal laser  photocoagulation; IVK intravitreal kenalog; VMT vitreomacular traction; MH Macular hole;  NVD neovascularization of the disc; NVE neovascularization elsewhere; AREDS age related eye disease study; ARMD age related macular degeneration; POAG primary open angle glaucoma; EBMD epithelial/anterior basement membrane dystrophy; ACIOL anterior chamber intraocular lens; IOL intraocular lens; PCIOL posterior chamber intraocular lens; Phaco/IOL phacoemulsification with intraocular lens placement; PRK photorefractive keratectomy; LASIK laser assisted in situ keratomileusis; HTN hypertension; DM diabetes mellitus; COPD chronic obstructive pulmonary disease

## 2023-01-07 ENCOUNTER — Encounter (INDEPENDENT_AMBULATORY_CARE_PROVIDER_SITE_OTHER): Payer: Self-pay | Admitting: Ophthalmology

## 2023-01-07 ENCOUNTER — Ambulatory Visit (INDEPENDENT_AMBULATORY_CARE_PROVIDER_SITE_OTHER): Payer: Medicare HMO | Admitting: Ophthalmology

## 2023-01-07 DIAGNOSIS — H35033 Hypertensive retinopathy, bilateral: Secondary | ICD-10-CM | POA: Diagnosis not present

## 2023-01-07 DIAGNOSIS — Z961 Presence of intraocular lens: Secondary | ICD-10-CM

## 2023-01-07 DIAGNOSIS — H353133 Nonexudative age-related macular degeneration, bilateral, advanced atrophic without subfoveal involvement: Secondary | ICD-10-CM

## 2023-01-07 DIAGNOSIS — H26492 Other secondary cataract, left eye: Secondary | ICD-10-CM

## 2023-01-07 DIAGNOSIS — I10 Essential (primary) hypertension: Secondary | ICD-10-CM | POA: Diagnosis not present

## 2023-01-07 DIAGNOSIS — H04123 Dry eye syndrome of bilateral lacrimal glands: Secondary | ICD-10-CM

## 2023-01-07 MED ORDER — PREDNISOLONE ACETATE 1 % OP SUSP: 1.0000 [drp] | Freq: Four times a day (QID) | OPHTHALMIC | 0 refills | Status: AC

## 2023-01-08 ENCOUNTER — Telehealth: Payer: Self-pay

## 2023-01-08 ENCOUNTER — Telehealth: Payer: Self-pay | Admitting: Cardiology

## 2023-01-08 NOTE — Telephone Encounter (Addendum)
Pt's daughter, [name], called stating that the pt's nephrologist has released her for an arteriogram by Dr. Edilia Bo now that her creatinine is improved.  Reviewed pt's chart, returned call for clarification, no answer, lf vm with possible availability for 5/23.  Phyllis returned call.  Called [place], no answer, lf vm to confirm if pt can be scheduled for 5/23.

## 2023-01-08 NOTE — Telephone Encounter (Signed)
Home nurse called to inform office that she was unable to get pt INR labs today due to her machine being broke. She wanted to know if pt would be able to have it done tomorrow during her office visit to avoid her having to be stuck multiple times. She requesting a call back if and when possible to be advised on whether or not that's an option. Please advise.

## 2023-01-08 NOTE — Telephone Encounter (Signed)
Spoke with nurse and explained no anticoag machine at this location but could send to [facility] after visit tomorrow.  Unfortunately lab would be sent out and no results until Monday so she stated she would try to meet up with another nurse to see about getting another machine. Would not be able to forward to managing MD until then Advised to call back if anything changes

## 2023-01-09 ENCOUNTER — Encounter (HOSPITAL_BASED_OUTPATIENT_CLINIC_OR_DEPARTMENT_OTHER): Payer: Self-pay | Admitting: Cardiology

## 2023-01-09 ENCOUNTER — Ambulatory Visit (INDEPENDENT_AMBULATORY_CARE_PROVIDER_SITE_OTHER): Payer: Medicare HMO | Admitting: Cardiology

## 2023-01-09 ENCOUNTER — Other Ambulatory Visit (INDEPENDENT_AMBULATORY_CARE_PROVIDER_SITE_OTHER): Payer: Self-pay | Admitting: Ophthalmology

## 2023-01-09 VITALS — BP 121/71 | HR 75 | Ht 65.0 in | Wt 159.4 lb

## 2023-01-09 DIAGNOSIS — I272 Pulmonary hypertension, unspecified: Secondary | ICD-10-CM

## 2023-01-09 DIAGNOSIS — I739 Peripheral vascular disease, unspecified: Secondary | ICD-10-CM | POA: Diagnosis not present

## 2023-01-09 DIAGNOSIS — I5032 Chronic diastolic (congestive) heart failure: Secondary | ICD-10-CM

## 2023-01-09 DIAGNOSIS — I350 Nonrheumatic aortic (valve) stenosis: Secondary | ICD-10-CM

## 2023-01-09 DIAGNOSIS — R600 Localized edema: Secondary | ICD-10-CM

## 2023-01-09 NOTE — Progress Notes (Signed)
Cardiology Office Note:    Date:  01/09/2023   ID:  [name], DOB February 25, 1928, MRN [id number]  PCP:  Eloisa Northern, MD  Cardiologist:  [name], MD PhD  Referring MD: Eloisa Northern, MD   CC: follow up  History of Present Illness:    [name] is a 89 y.o. female with a hx of atrial fibrillation on coumadin, essential hypertension, pulmonary hypertension, and chronic diastolic heart failure, here for follow up. Her initial consult (telemedicine) was 11/25/18.   Cardiac history: Previously followed by Dr. Orvis Brill at [facility] (notes under media tab). Per notes:  -echo 2014, lexiscan 2015 low risk,  -echo 2019 hyperdynamic EF, septal flattening, severe RV dilation, RVSP noted as critical (near systemic) pulmonary hypertension, severe biatrial enlargement, dilated coronary sinus ?persistent left SVC.  -Venous study 2019 with bilateral mild insufficiency -Echo 11/10/17 with hyperdynamic EF, septal flattening, severe RV dilation, severe TR, severe pulmonary hypertension (listed at 71 mmHg in note)  Today: Recent hospitalizations/treatments reviewed. Lost 50 lbs in fluid. Continues to deal with LE wounds. Daughter asking about risk of surgery if vascular procedure needed. Discussed at length today. No chest pain, breathing stable.  Discussed her echo at length. Suggests low flow low gradient severe aortic stenosis. Reviewed risk, management options.  Past Medical History:  Diagnosis Date   Anticoagulant long-term use    Arrhythmia    Arthritis    Atrial fibrillation (HCC)    Congestive heart failure (CHF) (HCC)    Dysphagia    Esophageal reflux    Essential hypertension    Hiatal hernia    Pulmonary embolus (HCC)    Thyroid disease     Past Surgical History:  Procedure Laterality Date   AMPUTATION TOE Right 11/17/2021   Procedure: RIGHT SECOND TOE AMPUTATION;  Surgeon: [name], MD;  Location: [facility];  Service: Orthopedics;  Laterality: Right;    COLONOSCOPY     ELBOW SURGERY     LAPAROSCOPIC HYSTERECTOMY      Current Medications: Current Outpatient Medications on File Prior to Visit  Medication Sig   acetaminophen (TYLENOL) 325 MG tablet Take 2 tablets (650 mg total) by mouth every 6 (six) hours as needed for mild pain or headache.   bumetanide (BUMEX) 1 MG tablet Take 1 mg by mouth 2 (two) times daily.   carboxymethylcellulose (REFRESH PLUS) 0.5 % SOLN Place 1 drop into both eyes 2 (two) times daily.   cefdinir (OMNICEF) 300 MG capsule Take 300 mg by mouth 2 (two) times daily.   cholecalciferol (VITAMIN D3) 25 MCG (1000 UT) tablet Take 1,000 Units by mouth daily.   diclofenac Sodium (VOLTAREN) 1 % GEL Apply 4 g topically 4 (four) times daily. To bilateral feet and knees   gabapentin (NEURONTIN) 300 MG capsule Take 300 mg by mouth 2 (two) times daily.    levothyroxine (SYNTHROID) 100 MCG tablet Take 100 mcg by mouth every morning.   Multiple Vitamins-Minerals (PRESERVISION/LUTEIN) CAPS Take 1 capsule by mouth 2 (two) times daily.    pantoprazole (PROTONIX) 40 MG tablet Take 40 mg by mouth daily.   polyethylene glycol powder (MIRALAX) 17 GM/SCOOP powder Take 17 g by mouth 2 (two) times daily as needed for moderate constipation.   prednisoLONE acetate (PRED FORTE) 1 % ophthalmic suspension Place 1 drop into the left eye 4 (four) times daily for 7 days.   senna-docusate (SENOKOT-S) 8.6-50 MG tablet Take 1 tablet by mouth 2 (two) times daily between meals as needed  for mild constipation.   spironolactone (ALDACTONE) 25 MG tablet Take 0.5 tablets (12.5 mg total) by mouth daily.   vitamin B-12 (CYANOCOBALAMIN) 1000 MCG tablet Take 1,000 mcg by mouth daily.   warfarin (COUMADIN) 1 MG tablet Take 1-2 mg by mouth as directed. Take 1 mg every Monday, Wednesday, and Friday. Take 2 mg all other days.   No current facility-administered medications on file prior to visit.     Allergies:   Amoxicillin, Azithromycin, Codeine, Erythromycin, Green  dyes, Iodine, Misc. sulfonamide containing compounds, Oxycodone, Oxycodone-acetaminophen, Penicillins, Sulfasalazine, and Tramadol   Social History   Tobacco Use   Smoking status: Former    Packs/day: 1    Types: Cigarettes    Quit date: 10/02/2001    Years since quitting: 21.2   Smokeless tobacco: Never   Tobacco comments:    former smoker  Vaping Use   Vaping Use: Never used     Family History: The patient's family history includes Cancer in her brother; Stroke in her mother.  ROS:   Please see the history of present illness.   (+) Bilateral LE edema  Additional pertinent ROS otherwise unremarkable.  EKGs/Labs/Other Studies Reviewed:    The following studies were reviewed today:  Bilateral LE Arterial Doppler 01/30/2022: Summary:  Right: Limited visualization due to involuntary movement and swelling.  Elevated velocities in the proximal/mid superficial femoral artery  suggesting 30-49% stenosis. Probable tibial occlusive disease - Posterior  tibial artery appears occluded with  reconstitution of flow in the distal calf. Peroneal artery unable to be  visualized.   Left: Limited visualization due to involuntary movement and swelling.  Elevated velocities in the proximal/mid superficial femoral artery  suggesting 50-74% stenosis. Probable tibial occlusive disease - Posterior  tibial artery appears occluded. Peroneal  artery unable to be visualized.   ABI Doppler 01/30/2022: Summary:  Right: Resting right ankle-brachial index indicates moderate right lower  extremity arterial disease.   Left: Resting left ankle-brachial index indicates mild left lower  extremity arterial disease. The left toe-brachial index is abnormal.    EKG:  EKG is personally reviewed.  01/09/23: not ordered today 08/28/2022: atrial fib at 55 bpm, rbbb  05/21/22: not ordered today 02/13/2022:  atrial fibrillation at 44 bpm, iRBBB 02/05/2021: Atrial fibrillation, Rate 54 bpm, iRBBB 01/05/20: Afib at  45 bpm  Recent Labs: 11/01/2022: B Natriuretic Peptide 329.2 11/04/2022: ALT 18 11/07/2022: BUN 33; Creatinine, Ser 1.28; Hemoglobin 10.4; Magnesium 2.2; Platelets 149; Potassium 4.2; Sodium 127   Recent Lipid Panel No results found for: "CHOL", "TRIG", "HDL", "CHOLHDL", "VLDL", "LDLCALC", "LDLDIRECT"  Physical Exam:    VS:  BP 121/71 (BP Location: Left Arm, Patient Position: Sitting, Cuff Size: Normal)   Pulse 75   Ht 5\' 5"  (1.651 m)   Wt 159 lb 6.4 oz (72.3 kg)   SpO2 97%   BMI 26.53 kg/m     Wt Readings from Last 3 Encounters:  01/09/23 159 lb 6.4 oz (72.3 kg)  12/19/22 153 lb 3.2 oz (69.5 kg)  11/07/22 177 lb 0.5 oz (80.3 kg)   GEN: Well nourished, well developed in no acute distress, in wheelchair HEENT: Normal, moist mucous membranes NECK: No JVD CARDIAC:  slow, irregularly irregular rhythm, normal S1 and S2, no rubs or gallops.  2/6 systolic murmur. VASCULAR: Radial and DP pulses 2+ bilaterally. No carotid bruits RESPIRATORY:  Clear to auscultation without rales, wheezing or rhonchi  ABDOMEN: Soft, non-tender, non-distended MUSCULOSKELETAL: moves all 4 limbs independently, though in wheelchair SKIN: Warm and  dry,  trace bilateral LE edema,  Erythematous/purplish skin discoloration of bilateral LE (R>L) NEUROLOGIC:  Alert and oriented x 3. No focal neuro deficits noted. PSYCHIATRIC:  Normal affect   ASSESSMENT:    1. Severe aortic stenosis   2. Diastolic CHF, chronic (HCC)   3. Moderate to severe pulmonary hypertension (HCC)   4. Peripheral vascular disease, unspecified (HCC)   5. Bilateral leg edema     PLAN:    Low flow low gradient severe aortic stenosis -reviewed echo at length today -reviewed what this is, symptoms it causes -patient does not want to undergo TAVR at this time, defers referral. With her comorbidities, unclear that she would be able to tolerate TAVR and she could not tolerate open AVR. They agree and do not wish to pursue at this  time.  Peripheral edema 2/2 severe pulmonary hypertension and chronic diastolic heart failure -has stabilized significantly. Daughter very involved in her care. Weights and symptoms stable. -Continue bumex and spironolactone as noted -continue daily weights, leg elevation, compression stockings, salt avoidance -we have extensively discussed her pulmonary hypertension, see prior notes  Lower extremity wounds PAD -following with Dr. Edilia Bo -discussed at length. Daughter is hesitant and wants to think about it. I reviewed that this is likely the best option to try to prevent further wounds and promote healing  Hypertension: -continue diuretics as above  Atrial fibrillation, permanent Slow ventricular response -CHA2DS2/VAS=at least 5 -on coumadin, managed per PCP.   Plan for follow up: 6 weeks  [name], MD, PhD, [facility]  [facility]    Signed, [name], MD PhD 01/09/2023  [facility]

## 2023-01-09 NOTE — Patient Instructions (Signed)
Medication Instructions:  None  *If you need a refill on your cardiac medications before your next appointment, please call your pharmacy*   Lab Work: None   Testing/Procedures: None   Follow-Up: At [facility], you and your health needs are our priority.  As part of our continuing mission to provide you with exceptional heart care, we have created designated Provider Care Teams.  These Care Teams include your primary Cardiologist (physician) and Advanced Practice Providers (APPs -  Physician Assistants and Nurse Practitioners) who all work together to provide you with the care you need, when you need it.  We recommend signing up for the patient portal called "MyChart".  Sign up information is provided on this After Visit Summary.  MyChart is used to connect with patients for Virtual Visits (Telemedicine).  Patients are able to view lab/test results, encounter notes, upcoming appointments, etc.  Non-urgent messages can be sent to your provider as well.   To learn more about what you can do with MyChart, go to [email or web address].    Your next appointment:   6 week(s)  Provider:   [name], MD    Other Instructions None

## 2023-01-15 ENCOUNTER — Observation Stay (HOSPITAL_COMMUNITY): Payer: Medicare HMO

## 2023-01-15 ENCOUNTER — Inpatient Hospital Stay (HOSPITAL_COMMUNITY)
Admission: EM | Admit: 2023-01-15 | Discharge: 2023-01-21 | DRG: 871 | Disposition: A | Discharge status: Home Health | Payer: Medicare HMO | Attending: Internal Medicine | Admitting: Internal Medicine

## 2023-01-15 ENCOUNTER — Emergency Department (HOSPITAL_COMMUNITY): Payer: Medicare HMO

## 2023-01-15 ENCOUNTER — Other Ambulatory Visit: Payer: Self-pay

## 2023-01-15 DIAGNOSIS — L89613 Pressure ulcer of right heel, stage 3: Secondary | ICD-10-CM | POA: Diagnosis present

## 2023-01-15 DIAGNOSIS — R531 Weakness: Secondary | ICD-10-CM

## 2023-01-15 DIAGNOSIS — I272 Pulmonary hypertension, unspecified: Secondary | ICD-10-CM | POA: Diagnosis present

## 2023-01-15 DIAGNOSIS — N390 Urinary tract infection, site not specified: Secondary | ICD-10-CM | POA: Diagnosis present

## 2023-01-15 DIAGNOSIS — N1832 Chronic kidney disease, stage 3b: Secondary | ICD-10-CM | POA: Diagnosis not present

## 2023-01-15 DIAGNOSIS — Z9102 Food additives allergy status: Secondary | ICD-10-CM

## 2023-01-15 DIAGNOSIS — S81802A Unspecified open wound, left lower leg, initial encounter: Secondary | ICD-10-CM | POA: Diagnosis present

## 2023-01-15 DIAGNOSIS — I10 Essential (primary) hypertension: Secondary | ICD-10-CM | POA: Diagnosis present

## 2023-01-15 DIAGNOSIS — Z881 Allergy status to other antibiotic agents status: Secondary | ICD-10-CM

## 2023-01-15 DIAGNOSIS — Z79899 Other long term (current) drug therapy: Secondary | ICD-10-CM

## 2023-01-15 DIAGNOSIS — Z86711 Personal history of pulmonary embolism: Secondary | ICD-10-CM | POA: Diagnosis present

## 2023-01-15 DIAGNOSIS — Z66 Do not resuscitate: Secondary | ICD-10-CM | POA: Diagnosis present

## 2023-01-15 DIAGNOSIS — L89623 Pressure ulcer of left heel, stage 3: Secondary | ICD-10-CM | POA: Diagnosis present

## 2023-01-15 DIAGNOSIS — I130 Hypertensive heart and chronic kidney disease with heart failure and stage 1 through stage 4 chronic kidney disease, or unspecified chronic kidney disease: Secondary | ICD-10-CM | POA: Diagnosis present

## 2023-01-15 DIAGNOSIS — I5032 Chronic diastolic (congestive) heart failure: Secondary | ICD-10-CM | POA: Diagnosis present

## 2023-01-15 DIAGNOSIS — Z888 Allergy status to other drugs, medicaments and biological substances status: Secondary | ICD-10-CM

## 2023-01-15 DIAGNOSIS — E039 Hypothyroidism, unspecified: Secondary | ICD-10-CM | POA: Diagnosis present

## 2023-01-15 DIAGNOSIS — Z7901 Long term (current) use of anticoagulants: Secondary | ICD-10-CM

## 2023-01-15 DIAGNOSIS — Z79890 Hormone replacement therapy (postmenopausal): Secondary | ICD-10-CM

## 2023-01-15 DIAGNOSIS — L89153 Pressure ulcer of sacral region, stage 3: Secondary | ICD-10-CM | POA: Diagnosis present

## 2023-01-15 DIAGNOSIS — Z880 Allergy status to penicillin: Secondary | ICD-10-CM

## 2023-01-15 DIAGNOSIS — Z90710 Acquired absence of both cervix and uterus: Secondary | ICD-10-CM

## 2023-01-15 DIAGNOSIS — K449 Diaphragmatic hernia without obstruction or gangrene: Secondary | ICD-10-CM | POA: Diagnosis present

## 2023-01-15 DIAGNOSIS — Z86718 Personal history of other venous thrombosis and embolism: Secondary | ICD-10-CM

## 2023-01-15 DIAGNOSIS — N3000 Acute cystitis without hematuria: Secondary | ICD-10-CM | POA: Diagnosis not present

## 2023-01-15 DIAGNOSIS — A419 Sepsis, unspecified organism: Secondary | ICD-10-CM | POA: Diagnosis not present

## 2023-01-15 DIAGNOSIS — I350 Nonrheumatic aortic (valve) stenosis: Secondary | ICD-10-CM | POA: Diagnosis present

## 2023-01-15 DIAGNOSIS — B965 Pseudomonas (aeruginosa) (mallei) (pseudomallei) as the cause of diseases classified elsewhere: Secondary | ICD-10-CM | POA: Diagnosis present

## 2023-01-15 DIAGNOSIS — Z8616 Personal history of COVID-19: Secondary | ICD-10-CM

## 2023-01-15 DIAGNOSIS — K5732 Diverticulitis of large intestine without perforation or abscess without bleeding: Secondary | ICD-10-CM | POA: Diagnosis present

## 2023-01-15 DIAGNOSIS — D649 Anemia, unspecified: Secondary | ICD-10-CM | POA: Diagnosis present

## 2023-01-15 DIAGNOSIS — R0902 Hypoxemia: Secondary | ICD-10-CM | POA: Diagnosis present

## 2023-01-15 DIAGNOSIS — Z885 Allergy status to narcotic agent status: Secondary | ICD-10-CM

## 2023-01-15 DIAGNOSIS — I739 Peripheral vascular disease, unspecified: Secondary | ICD-10-CM | POA: Diagnosis present

## 2023-01-15 DIAGNOSIS — D631 Anemia in chronic kidney disease: Secondary | ICD-10-CM | POA: Diagnosis present

## 2023-01-15 DIAGNOSIS — Z87891 Personal history of nicotine dependence: Secondary | ICD-10-CM

## 2023-01-15 DIAGNOSIS — M1990 Unspecified osteoarthritis, unspecified site: Secondary | ICD-10-CM | POA: Diagnosis present

## 2023-01-15 DIAGNOSIS — Z89421 Acquired absence of other right toe(s): Secondary | ICD-10-CM

## 2023-01-15 DIAGNOSIS — K5150 Left sided colitis without complications: Secondary | ICD-10-CM | POA: Diagnosis present

## 2023-01-15 DIAGNOSIS — Z1152 Encounter for screening for COVID-19: Secondary | ICD-10-CM

## 2023-01-15 DIAGNOSIS — J449 Chronic obstructive pulmonary disease, unspecified: Secondary | ICD-10-CM | POA: Diagnosis present

## 2023-01-15 DIAGNOSIS — I4821 Permanent atrial fibrillation: Secondary | ICD-10-CM | POA: Diagnosis present

## 2023-01-15 DIAGNOSIS — K219 Gastro-esophageal reflux disease without esophagitis: Secondary | ICD-10-CM | POA: Diagnosis present

## 2023-01-15 DIAGNOSIS — L8990 Pressure ulcer of unspecified site, unspecified stage: Secondary | ICD-10-CM | POA: Diagnosis present

## 2023-01-15 DIAGNOSIS — Z882 Allergy status to sulfonamides status: Secondary | ICD-10-CM

## 2023-01-15 LAB — COMPREHENSIVE METABOLIC PANEL
ALT: 14 U/L (ref 0–44)
AST: 30 U/L (ref 15–41)
Albumin: 2.8 g/dL — ABNORMAL LOW (ref 3.5–5.0)
Alkaline Phosphatase: 131 U/L — ABNORMAL HIGH (ref 38–126)
Anion gap: 10 (ref 5–15)
BUN: 15 mg/dL (ref 8–23)
CO2: 28 mmol/L (ref 22–32)
Calcium: 9.1 mg/dL (ref 8.9–10.3)
Chloride: 95 mmol/L — ABNORMAL LOW (ref 98–111)
Creatinine, Ser: 1.16 mg/dL — ABNORMAL HIGH (ref 0.44–1.00)
GFR, Estimated: 44 mL/min — ABNORMAL LOW (ref >60)
Glucose, Bld: 98 mg/dL (ref 70–99)
Potassium: 3.5 mmol/L (ref 3.5–5.1)
Sodium: 133 mmol/L — ABNORMAL LOW (ref 135–145)
Total Bilirubin: 1.4 mg/dL — ABNORMAL HIGH (ref 0.3–1.2)
Total Protein: 6.4 g/dL — ABNORMAL LOW (ref 6.5–8.1)

## 2023-01-15 LAB — APTT: aPTT: 40 seconds — ABNORMAL HIGH (ref 24–36)

## 2023-01-15 LAB — IRON AND TIBC
Iron: 14 ug/dL — ABNORMAL LOW (ref 28–170)
Saturation Ratios: 7 % — ABNORMAL LOW (ref 10.4–31.8)
TIBC: 204 ug/dL — ABNORMAL LOW (ref 250–450)
UIBC: 190 ug/dL

## 2023-01-15 LAB — URINALYSIS, W/ REFLEX TO CULTURE (INFECTION SUSPECTED)
Bilirubin Urine: NEGATIVE
Glucose, UA: NEGATIVE mg/dL
Ketones, ur: NEGATIVE mg/dL
Nitrite: POSITIVE — AB
Protein, ur: NEGATIVE mg/dL
Specific Gravity, Urine: 1.01 (ref 1.005–1.030)
WBC, UA: >50 WBC/hpf (ref 0–5)
pH: 6 (ref 5.0–8.0)

## 2023-01-15 LAB — RETICULOCYTES
Immature Retic Fract: 8.5 % (ref 2.3–15.9)
RBC.: 3.44 MIL/uL — ABNORMAL LOW (ref 3.87–5.11)
Retic Count, Absolute: 38.9 10*3/uL (ref 19.0–186.0)
Retic Ct Pct: 1.1 % (ref 0.4–3.1)

## 2023-01-15 LAB — CBC WITH DIFFERENTIAL/PLATELET
Abs Immature Granulocytes: 0.08 10*3/uL — ABNORMAL HIGH (ref 0.00–0.07)
Basophils Absolute: 0 10*3/uL (ref 0.0–0.1)
Basophils Relative: 0 %
Eosinophils Absolute: 0 10*3/uL (ref 0.0–0.5)
Eosinophils Relative: 0 %
HCT: 33.4 % — ABNORMAL LOW (ref 36.0–46.0)
Hemoglobin: 10.4 g/dL — ABNORMAL LOW (ref 12.0–15.0)
Immature Granulocytes: 1 %
Lymphocytes Relative: 7 %
Lymphs Abs: 1.1 10*3/uL (ref 0.7–4.0)
MCH: 29.9 pg (ref 26.0–34.0)
MCHC: 31.1 g/dL (ref 30.0–36.0)
MCV: 96 fL (ref 80.0–100.0)
Monocytes Absolute: 0.9 10*3/uL (ref 0.1–1.0)
Monocytes Relative: 6 %
Neutro Abs: 13 10*3/uL — ABNORMAL HIGH (ref 1.7–7.7)
Neutrophils Relative %: 86 %
Platelets: 189 10*3/uL (ref 150–400)
RBC: 3.48 MIL/uL — ABNORMAL LOW (ref 3.87–5.11)
RDW: 17.8 % — ABNORMAL HIGH (ref 11.5–15.5)
WBC: 15.2 10*3/uL — ABNORMAL HIGH (ref 4.0–10.5)
nRBC: 0 % (ref 0.0–0.2)

## 2023-01-15 LAB — RESP PANEL BY RT-PCR (RSV, FLU A&B, COVID)  RVPGX2
Influenza A by PCR: NEGATIVE
Influenza B by PCR: NEGATIVE
Resp Syncytial Virus by PCR: NEGATIVE
SARS Coronavirus 2 by RT PCR: NEGATIVE

## 2023-01-15 LAB — FERRITIN: Ferritin: 162 ng/mL (ref 11–307)

## 2023-01-15 LAB — PHOSPHORUS: Phosphorus: 2.7 mg/dL (ref 2.5–4.6)

## 2023-01-15 LAB — CREATININE, URINE, RANDOM: Creatinine, Urine: 55 mg/dL

## 2023-01-15 LAB — VITAMIN B12: Vitamin B-12: 784 pg/mL (ref 180–914)

## 2023-01-15 LAB — MAGNESIUM: Magnesium: 1.6 mg/dL — ABNORMAL LOW (ref 1.7–2.4)

## 2023-01-15 LAB — FOLATE: Folate: 9.5 ng/mL (ref >5.9)

## 2023-01-15 LAB — LACTIC ACID, PLASMA: Lactic Acid, Venous: 1 mmol/L (ref 0.5–1.9)

## 2023-01-15 LAB — CULTURE, BLOOD (ROUTINE X 2)

## 2023-01-15 LAB — TSH: TSH: 0.997 u[IU]/mL (ref 0.350–4.500)

## 2023-01-15 LAB — PROTIME-INR
INR: 2 — ABNORMAL HIGH (ref 0.8–1.2)
Prothrombin Time: 22.6 seconds — ABNORMAL HIGH (ref 11.4–15.2)

## 2023-01-15 LAB — SODIUM, URINE, RANDOM: Sodium, Ur: 70 mmol/L

## 2023-01-15 LAB — PROCALCITONIN: Procalcitonin: 0.1 ng/mL

## 2023-01-15 LAB — CK: Total CK: 34 U/L — ABNORMAL LOW (ref 38–234)

## 2023-01-15 MED ORDER — ACETAMINOPHEN 325 MG PO TABS
650.0000 mg | ORAL_TABLET | Freq: Once | ORAL | Status: AC
  Administered 2023-01-15: 650 mg via ORAL
  Filled 2023-01-15: qty 2

## 2023-01-15 MED ORDER — LACTATED RINGERS IV SOLN: 150.0000 mL/h | INTRAVENOUS | Status: AC

## 2023-01-15 MED ORDER — LEVOFLOXACIN IN D5W 750 MG/150ML IV SOLN: 750.0000 mg | INTRAVENOUS | Status: DC

## 2023-01-15 MED ORDER — LACTATED RINGERS IV BOLUS (SEPSIS)
1000.0000 mL | Freq: Once | INTRAVENOUS | Status: AC
  Administered 2023-01-15: 1000 mL via INTRAVENOUS

## 2023-01-15 MED ORDER — IBUPROFEN 200 MG PO TABS
600.0000 mg | ORAL_TABLET | Freq: Once | ORAL | Status: AC
  Administered 2023-01-15: 600 mg via ORAL
  Filled 2023-01-15: qty 3

## 2023-01-15 MED ORDER — LEVOFLOXACIN IN D5W 750 MG/150ML IV SOLN
750.0000 mg | INTRAVENOUS | Status: DC
  Administered 2023-01-15: 750 mg via INTRAVENOUS
  Filled 2023-01-15: qty 150

## 2023-01-15 MED ORDER — LACTATED RINGERS IV BOLUS (SEPSIS)
250.0000 mL | Freq: Once | INTRAVENOUS | Status: AC
  Administered 2023-01-15: 250 mL via INTRAVENOUS

## 2023-01-15 MED ORDER — ACETAMINOPHEN 325 MG PO TABS
ORAL_TABLET | ORAL | Status: AC
  Filled 2023-01-15: qty 2

## 2023-01-15 MED ORDER — LACTATED RINGERS IV SOLN: INTRAVENOUS | Status: DC

## 2023-01-15 MED ORDER — WARFARIN SODIUM 2 MG PO TABS
2.0000 mg | ORAL_TABLET | Freq: Once | ORAL | Status: AC
  Administered 2023-01-15: 2 mg via ORAL
  Filled 2023-01-15: qty 1

## 2023-01-15 MED ORDER — WARFARIN - PHARMACIST DOSING INPATIENT: Freq: Every day | Status: DC

## 2023-01-15 MED ORDER — MAGNESIUM SULFATE 2 GM/50ML IV SOLN
2.0000 g | Freq: Once | INTRAVENOUS | Status: AC
  Administered 2023-01-16: 2 g via INTRAVENOUS
  Filled 2023-01-15: qty 50

## 2023-01-15 NOTE — Assessment & Plan Note (Signed)
Chronic-stable.

## 2023-01-15 NOTE — Assessment & Plan Note (Signed)
Obtain anemia panel in AM 

## 2023-01-15 NOTE — Progress Notes (Signed)
Elink monitoring for the code sepsis protocol.  

## 2023-01-15 NOTE — ED Triage Notes (Signed)
Patient BIB EMS from home c/o fever and AMS x1 day. Family report worsening confusion this afternoon. 500LR given by EMS. Pt denies N/V. Pt a/ox1

## 2023-01-15 NOTE — Assessment & Plan Note (Signed)
Continue coumadin per pharmacy

## 2023-01-15 NOTE — Assessment & Plan Note (Signed)
Will need PT OT assessment prior to dc

## 2023-01-15 NOTE — Assessment & Plan Note (Signed)
Contnue coumadin per pahrmacy

## 2023-01-15 NOTE — Progress Notes (Signed)
ANTICOAGULATION CONSULT NOTE - Initial Consult  Pharmacy Consult for Warfarin Indication: atrial fibrillation, H/O PE  Allergies  Allergen Reactions   Amoxicillin Other (See Comments)    Unknown reaction    Azithromycin Other (See Comments)    Unknown reaction    Codeine Other (See Comments)    Unknown reaction    Erythromycin Other (See Comments)    Unknown reaction    Green Dyes Other (See Comments)    Allergic to ALL dyes   Iodine Other (See Comments)    Unknown reaction    Misc. Sulfonamide Containing Compounds    Oxycodone Other (See Comments)    Unknown reaction      Oxycodone-Acetaminophen Other (See Comments)    Unknown reaction    Penicillins Other (See Comments)    Unknown reaction    Sulfasalazine    Tramadol Hives    Patient Measurements: Height: 5\' 5"  (165.1 cm) Weight: 72.3 kg (159 lb 6.3 oz) IBW/kg (Calculated) : 57   Vital Signs: Temp: 98.9 F (37.2 C) (05/09 1952) Temp Source: Oral (05/09 1952) BP: 131/71 (05/09 2200) Pulse Rate: 73 (05/09 2200)  Labs: Recent Labs    01/15/23 1842  HGB 10.4*  HCT 33.4*  PLT 189  APTT 40*  LABPROT 22.6*  INR 2.0*  CREATININE 1.16*  CKTOTAL 34*    Estimated Creatinine Clearance: 30.8 mL/min (A) (by C-G formula based on SCr of 1.16 mg/dL (H)).   Medical History: Past Medical History:  Diagnosis Date   Anticoagulant long-term use    Arrhythmia    Arthritis    Atrial fibrillation (HCC)    Congestive heart failure (CHF) (HCC)    Dysphagia    Esophageal reflux    Essential hypertension    Hiatal hernia    Pulmonary embolus (HCC)    Thyroid disease     Medications:  PTA Warfarin 2mg  M-F and 1mg  Sat and Sun - LD dose taken 5/8 @ 2200  Assessment: 89 yr female admitted with sepsis/UTI.  Patient recently completed course of cefdinir for UTI and presents to ED with fever PMH significant for COPD, PE, AFib, CKD, PAD, CHF Levofloxacin started for sepsis/UTI, which can increase effect of warfarin;  will need to closely monitor INR while also on levofloxacin  Goal of Therapy:  INR 2-3 Monitor platelets by anticoagulation protocol: Yes   Plan:  Warfarin 2mg  po x 1 tonight Daily PT/INR Closely follow INR due to patient on levofloxacin  Judith Campillo, Joselyn Glassman, PharmD 01/15/2023,11:32 PM

## 2023-01-15 NOTE — ED Provider Notes (Signed)
[place] EMERGENCY DEPARTMENT AT [facility] Provider Note   CSN: [id number] Arrival date & time: 01/15/23  1816     History  Chief Complaint  Patient presents with   Fever    [name] is a 89 y.o. female.  Pt is a 89 yo female with pmhx significant for atrial fibrillation, PE (on coumadin), HTN, pulmonary hypertension, chronic diastolic heart failure, and CKD.  Pt developed a fever today around 1330.  She was given tylenol by her daughter, but fever continued to elevated.  Per EMS, she was recently tx'd for a UTI.  Daughter said she finished cefdinir last night.  Pt denies any sx.       Home Medications Prior to Admission medications   Medication Sig Start Date End Date Taking? Authorizing Provider  acetaminophen (TYLENOL) 325 MG tablet Take 2 tablets (650 mg total) by mouth every 6 (six) hours as needed for mild pain or headache. Patient taking differently: Take 650 mg by mouth every 6 (six) hours as needed for mild pain, headache or fever. 11/06/22  Yes Almon Hercules, MD  bumetanide (BUMEX) 0.5 MG tablet Take 0.5 mg by mouth 2 (two) times daily.   Yes [provider]  carboxymethylcellulose (REFRESH PLUS) 0.5 % SOLN Place 1 drop into both eyes 2 (two) times daily.   Yes [provider]  cholecalciferol (VITAMIN D3) 25 MCG (1000 UT) tablet Take 1,000 Units by mouth daily.   Yes [provider]  diclofenac Sodium (VOLTAREN) 1 % GEL Apply 4 g topically 4 (four) times daily. To bilateral feet and knees Patient taking differently: Apply 4 g topically 4 (four) times daily as needed (pain). To bilateral feet and knees 11/06/22  Yes Gonfa, Taye T, MD  gabapentin (NEURONTIN) 300 MG capsule Take 300 mg by mouth 2 (two) times daily.    Yes [provider]  levothyroxine (SYNTHROID) 100 MCG tablet Take 100 mcg by mouth every morning. 09/05/21  Yes [provider]  Multiple Vitamins-Minerals (PRESERVISION/LUTEIN) CAPS Take 1 capsule  by mouth 2 (two) times daily.    Yes [provider]  pantoprazole (PROTONIX) 40 MG tablet Take 40 mg by mouth daily.   Yes [provider]  polyethylene glycol powder (MIRALAX) 17 GM/SCOOP powder Take 17 g by mouth 2 (two) times daily as needed for moderate constipation. 11/06/22  Yes Almon Hercules, MD  senna-docusate (SENOKOT-S) 8.6-50 MG tablet Take 1 tablet by mouth 2 (two) times daily between meals as needed for mild constipation. 11/06/22  Yes Almon Hercules, MD  spironolactone (ALDACTONE) 25 MG tablet Take 0.5 tablets (12.5 mg total) by mouth daily. 12/19/22  Yes Alver Sorrow, NP  vitamin B-12 (CYANOCOBALAMIN) 1000 MCG tablet Take 1,000 mcg by mouth daily.   Yes [provider]  warfarin (COUMADIN) 1 MG tablet Take 1-2 mg by mouth as directed. Take 2 tablets (2 mg) (Monday thru Friday) & Take 1 tablet (1 mg) on (Saturday & Sunday)   Yes [provider]      Allergies    Amoxicillin, Azithromycin, Codeine, Erythromycin, Green dyes, Iodine, Misc. sulfonamide containing compounds, Oxycodone, Oxycodone-acetaminophen, Penicillins, Sulfasalazine, and Tramadol    Review of Systems   Review of Systems  Constitutional:  Positive for fever.    Physical Exam Updated Vital Signs BP (!) 85/50   Pulse 79   Temp 98.9 F (37.2 C) (Oral)   Resp 17   Ht 5\' 5"  (1.651 m)   Wt 72.3  kg   SpO2 99%   BMI 26.52 kg/m  Physical Exam Vitals and nursing note reviewed.  Constitutional:      Appearance: Normal appearance.  HENT:     Head: Normocephalic and atraumatic.     Right Ear: External ear normal.     Left Ear: External ear normal.     Nose: Nose normal.     Mouth/Throat:     Mouth: Mucous membranes are moist.     Pharynx: Oropharynx is clear.  Cardiovascular:     Rate and Rhythm: Normal rate. Rhythm irregular.     Pulses: Normal pulses.     Heart sounds: Normal heart sounds.  Pulmonary:     Effort: Pulmonary effort is normal.     Breath sounds:  Normal breath sounds.  Abdominal:     General: Abdomen is flat.     Palpations: Abdomen is soft.  Musculoskeletal:        General: Normal range of motion.     Cervical back: Normal range of motion and neck supple.  Skin:    General: Skin is warm.     Capillary Refill: Capillary refill takes less than 2 seconds.  Neurological:     General: No focal deficit present.     Mental Status: She is alert. Mental status is at baseline.  Psychiatric:        Mood and Affect: Mood normal.        Behavior: Behavior normal.     ED Results / Procedures / Treatments   Labs (all labs ordered are listed, but only abnormal results are displayed) Labs Reviewed  COMPREHENSIVE METABOLIC PANEL - Abnormal; Notable for the following components:      Result Value   Sodium 133 (*)    Chloride 95 (*)    Creatinine, Ser 1.16 (*)    Total Protein 6.4 (*)    Albumin 2.8 (*)    Alkaline Phosphatase 131 (*)    Total Bilirubin 1.4 (*)    GFR, Estimated 44 (*)    All other components within normal limits  CBC WITH DIFFERENTIAL/PLATELET - Abnormal; Notable for the following components:   WBC 15.2 (*)    RBC 3.48 (*)    Hemoglobin 10.4 (*)    HCT 33.4 (*)    RDW 17.8 (*)    Neutro Abs 13.0 (*)    Abs Immature Granulocytes 0.08 (*)    All other components within normal limits  PROTIME-INR - Abnormal; Notable for the following components:   Prothrombin Time 22.6 (*)    INR 2.0 (*)    All other components within normal limits  APTT - Abnormal; Notable for the following components:   aPTT 40 (*)    All other components within normal limits  URINALYSIS, W/ REFLEX TO CULTURE (INFECTION SUSPECTED) - Abnormal; Notable for the following components:   APPearance CLOUDY (*)    Hgb urine dipstick SMALL (*)    Nitrite POSITIVE (*)    Leukocytes,Ua LARGE (*)    Bacteria, UA MANY (*)    All other components within normal limits  CULTURE, BLOOD (ROUTINE X 2)  CULTURE, BLOOD (ROUTINE X 2)  RESP PANEL BY RT-PCR  (RSV, FLU A&B, COVID)  RVPGX2  URINE CULTURE  LACTIC ACID, PLASMA    EKG EKG Interpretation  Date/Time:  Thursday Jan 15 2023 18:43:14 EDT Ventricular Rate:  90 PR Interval:    QRS Duration: 105 QT Interval:  375 QTC Calculation: 459 R Axis:   163 Text  Interpretation: Atrial fibrillation Right ventricular hypertrophy Lateral infarct, age indeterminate Confirmed by [name] ([id number]) on 01/15/2023 7:05:24 PM  Radiology DG Chest Port 1 View  Result Date: 01/15/2023 CLINICAL DATA:  Questionable sepsis, evaluate for abnormality. EXAM: PORTABLE CHEST 1 VIEW COMPARISON:  11/01/2022 FINDINGS: Stable cardiomegaly. Aortic atherosclerotic calcification. Chronic bronchitic changes and interstitial coarsening. Probable small bilateral pleural effusions. No pneumothorax. No definite displaced rib fractures. Advanced arthritis both shoulders. IMPRESSION: 1. Cardiomegaly and interstitial coarsening suggestive of edema. Chronic bronchitic lung changes or viral infection could appear similarly. 2. Small bilateral pleural effusions. Electronically Signed   By: Minerva Fester M.D.   On: 01/15/2023 19:25    Procedures Procedures    Medications Ordered in ED Medications  acetaminophen (TYLENOL) 325 MG tablet (has no administration in time range)  lactated ringers infusion (has no administration in time range)  acetaminophen (TYLENOL) tablet 650 mg (650 mg Oral Given 01/15/23 1852)  ibuprofen (ADVIL) tablet 600 mg (600 mg Oral Given 01/15/23 1852)  lactated ringers bolus 1,000 mL (0 mLs Intravenous Stopped 01/15/23 2149)    And  lactated ringers bolus 1,000 mL (1,000 mLs Intravenous New Bag/Given 01/15/23 2102)    And  lactated ringers bolus 250 mL (250 mLs Intravenous New Bag/Given 01/15/23 2103)    ED Course/ Medical Decision Making/ A&P                             Medical Decision Making Amount and/or Complexity of Data Reviewed Labs: ordered. Radiology: ordered. ECG/medicine tests:  ordered.  Risk OTC drugs. Prescription drug management. Decision regarding hospitalization.   This patient presents to the ED for concern of fever, this involves an extensive number of treatment options, and is a complaint that carries with it a high risk of complications and morbidity.  The differential diagnosis includes sepsis, sirs, infection   Co morbidities that complicate the patient evaluation  atrial fibrillation, PE (on coumadin), HTN, pulmonary hypertension, chronic diastolic heart failure, and CKD.   Additional history obtained:  Additional history obtained from epic chart review External records from outside source obtained and reviewed including EMS report   Lab Tests:  I Ordered, and personally interpreted labs.  The pertinent results include:  inr 2.0, lactic 1.0, cbc with wbc elevated at 15.2, hgb 10.4 (chronic anemia), cmp with cr 1.16 (chronic), albumin 2.8 (chronic), covid/flu/rsv neg, ua + for uti    Imaging Studies ordered:  I ordered imaging studies including cxr  I independently visualized and interpreted imaging which showed  Cardiomegaly and interstitial coarsening suggestive of edema.  Chronic bronchitic lung changes or viral infection could appear  similarly.  2. Small bilateral pleural effusions.   I agree with the radiologist interpretation   Cardiac Monitoring:  The patient was maintained on a cardiac monitor.  I personally viewed and interpreted the cardiac monitored which showed an underlying rhythm of: afib   Medicines ordered and prescription drug management:  I ordered medication including tylenol/ibuprofen  for fever  Reevaluation of the patient after these medicines showed that the patient improved I have reviewed the patients home medicines and have made adjustments as needed   Test Considered:  ct   Critical Interventions:  Abx, ivfs   Consultations Obtained:  I requested consultation with the hospitalist (Dr.  Adela Glimpse),  and discussed lab and imaging findings as well as pertinent plan - she will admit   Problem List / ED Course:  Sepsis:  initial bp  ok, but it has trended downwards.  She is given sepsis fluids and iv levaquin as she just finished cefidnir.  BP did respond to fluids.  Code sepsis called.  Blood and urine cultures sent.   Reevaluation:  After the interventions noted above, I reevaluated the patient and found that they have :improved   Social Determinants of Health:  Lives with daughter   Dispostion:  After consideration of the diagnostic results and the patients response to treatment, I feel that the patent would benefit from admission.  CRITICAL CARE Performed by: [name]   Total critical care time: 30 minutes  Critical care time was exclusive of separately billable procedures and treating other patients.  Critical care was necessary to treat or prevent imminent or life-threatening deterioration.  Critical care was time spent personally by me on the following activities: development of treatment plan with patient and/or surrogate as well as nursing, discussions with consultants, evaluation of patient's response to treatment, examination of patient, obtaining history from patient or surrogate, ordering and performing treatments and interventions, ordering and review of laboratory studies, ordering and review of radiographic studies, pulse oximetry and re-evaluation of patient's condition.           Final Clinical Impression(s) / ED Diagnoses Final diagnoses:  Acute cystitis without hematuria  Sepsis without acute organ dysfunction, due to unspecified organism [facility])    Rx / DC Orders ED Discharge Orders     None         Megan Lefevre, MD 01/15/23 2211

## 2023-01-15 NOTE — Assessment & Plan Note (Signed)
-  SIRS criteria met with  elevated white blood cell count,       Component Value Date/Time   WBC 15.2 (H) 01/15/2023 1842   LYMPHSABS 1.1 01/15/2023 1842       fever   RR >20 Today's Vitals   01/15/23 2000 01/15/23 2030 01/15/23 2103 01/15/23 2108  BP: (!) 104/57 (!) 92/52 (!) 85/50   Pulse: 78 80 80 79  Resp: (!) 28 20 (!) 22 17  Temp:      TempSrc:      SpO2: 100% 98% 99% 99%  Weight:      Height:         The recent clinical data is shown below. Vitals:   01/15/23 2000 01/15/23 2030 01/15/23 2103 01/15/23 2108  BP: (!) 104/57 (!) 92/52 (!) 85/50   Pulse: 78 80 80 79  Resp: (!) 28 20 (!) 22 17  Temp:      TempSrc:      SpO2: 100% 98% 99% 99%  Weight:      Height:         -Most likely source being:  urinary,        - Obtain serial lactic acid and procalcitonin level.  - Initiated IV antibiotics in ER: Antibiotics Given (last 72 hours)     Date/Time Action Medication Dose Rate   01/15/23 2109 New Bag/Given   levofloxacin (LEVAQUIN) IVPB 750 mg 750 mg 100 mL/hr       Will continue  on :levaquin   - await results of blood and urine culture  - Rehydrate aggressively  Intravenous fluids were administered,      10:11 PM

## 2023-01-15 NOTE — Assessment & Plan Note (Signed)
Pressure wounds of left leg no evidence of infection at this time Cont to monitor order wound consult

## 2023-01-15 NOTE — Assessment & Plan Note (Signed)
Will replace and recheck 

## 2023-01-15 NOTE — Subjective & Objective (Signed)
Presented from home reporting fever for 1 day, given Tylenol but still febrile Recently finished cefdinir for UTI Pt on anticoagulation with coumadin in the setting of PE/ a.fib  Her machine to check INR recently broke

## 2023-01-15 NOTE — Progress Notes (Signed)
Pharmacy Antibiotic Note  [name] is a 89 y.o. female admitted on 01/15/2023 with sepsis/UTI.  Pharmacy has been consulted for Levaquin dosing.  Patient recently completed course of Cefdinir.  Plan: Levaquin 750mg  IV q48h Follow renal function F/u culture results and sensitivities  Height: 5\' 5"  (165.1 cm) Weight: 72.3 kg (159 lb 6.3 oz) IBW/kg (Calculated) : 57  Temp (24hrs), Avg:100 F (37.8 C), Min:98.9 F (37.2 C), Max:101.1 F (38.4 C)  Recent Labs  Lab 01/15/23 1842  WBC 15.2*  CREATININE 1.16*  LATICACIDVEN 1.0    Estimated Creatinine Clearance: 30.8 mL/min (A) (by C-G formula based on SCr of 1.16 mg/dL (H)).    Allergies  Allergen Reactions   Amoxicillin Other (See Comments)    Unknown reaction    Azithromycin Other (See Comments)    Unknown reaction    Codeine Other (See Comments)    Unknown reaction    Erythromycin Other (See Comments)    Unknown reaction    Green Dyes Other (See Comments)    Allergic to ALL dyes   Iodine Other (See Comments)    Unknown reaction    Misc. Sulfonamide Containing Compounds    Oxycodone Other (See Comments)    Unknown reaction      Oxycodone-Acetaminophen Other (See Comments)    Unknown reaction    Penicillins Other (See Comments)    Unknown reaction    Sulfasalazine    Tramadol Hives    Antimicrobials this admission: 5/9 Levaquin >>      Dose adjustments this admission:    Microbiology results: 5/9 BCx:   5/9 UCx:       Thank you for allowing pharmacy to be a part of this patient's care.  Maryellen Pile, PharmD 01/15/2023 11:19 PM

## 2023-01-15 NOTE — Assessment & Plan Note (Signed)
-  chronic avoid nephrotoxic medications such as NSAIDs, Vanco Zosyn combo,  avoid hypotension, continue to follow renal function  

## 2023-01-15 NOTE — H&P (Addendum)
[name]:[id number] DOB: Feb 01, 1928 DOA: 01/15/2023     PCP: Camie Patience, FNP   Outpatient Specialists: CARDS:  Dr. [name], MD  NEphrology:  Dr. Glenna Fellows    Vascular Dr.Dickson   Patient arrived to ER on 01/15/23 at 1816 Referred by Attending Jacalyn Lefevre, MD   Patient coming from:    home Lives With family     Chief Complaint:   Chief Complaint  Patient presents with   Fever    HPI: [name] is a 89 y.o. female with medical history significant of COPD, PE/ A.fib on coumadin, CKD 3b, PAD, chronic leg wounds, diastolic CHF, hypothyroidism Severe aortic stenosis     Presented with   fever Presented from home reporting fever for 1 day, given Tylenol but still febrile Recently finished cefdinir for UTI Pt on anticoagulation with coumadin in the setting of PE/ a.fib  Her machine to check INR recently broke   Recent admit with Covid and went to SNF after that has been at home since March  Has a wound on the heal but it was not infected Denies significant ETOH intake   Does not smoke   Lab Results  Component Value Date   SARSCOV2NAA NEGATIVE 01/15/2023   SARSCOV2NAA POSITIVE (A) 10/23/2022   SARSCOV2NAA NEGATIVE 11/15/2021   SARSCOV2NAA NEGATIVE 08/02/2020       Regarding pertinent Chronic problems:     Severe aortic stenosis Deemed to be poor TAVR candidate but cardiology will follow outpatient with limited echo   chronic CHF diastolic  - last echo April 2024 TTE with severe aortic and tricuspid stenosis and LVEF of 60 to 65% and moderately enlarged RV and severe LAE and RAE but no RWMA.    LVEF of 60 to 65   On Bumex and Aldactone     Hypothyroidism: on synthroid     COPD - not   followed by pulmonology   was on baseline oxygen  1L    A. Fib -  - CHA2DS2 vas score      7     current  on anticoagulation with Coumadin        Hx of DVT/PE on - anticoagulation with Coumadin       CKD stage IIIb baseline Cr1.3 Estimated  Creatinine Clearance: 30.8 mL/min (A) (by C-G formula based on SCr of 1.16 mg/dL (H)).  Lab Results  Component Value Date   CREATININE 1.16 (H) 01/15/2023   CREATININE 1.28 (H) 11/07/2022   CREATININE 1.34 (H) 11/06/2022        Chronic anemia - baseline hg Hemoglobin & Hematocrit  Recent Labs    11/06/22 0416 11/07/22 0820 01/15/23 1842  HGB 10.2* 10.4* 10.4*   Iron/TIBC/Ferritin/ %Sat    Component Value Date/Time   IRON 40 11/21/2021 0532   TIBC 238 (L) 11/21/2021 0532   FERRITIN 108 11/21/2021 0532   IRONPCTSAT 17 11/21/2021 0532    While in ER:   Noted to meet sepsis criteria given that patient recently received cephalosporins with no improvement she was started on Levaquin with pharmacy to dose    Lab Orders         Blood Culture (routine x 2)         Resp panel by RT-PCR (RSV, Flu A&B, Covid) Anterior Nasal Swab         Urine Culture         Lactic acid, plasma         Comprehensive  metabolic panel         CBC with Differential         Protime-INR         APTT         Urinalysis, w/ Reflex to Culture (Infection Suspected) -Urine, Clean Catch        CXR -  Cardiomegaly and interstitial coarsening suggestive of edema. Chronic bronchitic lung changes or viral infection could appear similarly. 2. Small bilateral pleural effusions.    Following Medications were ordered in ER: Medications  acetaminophen (TYLENOL) 325 MG tablet (has no administration in time range)  lactated ringers infusion (has no administration in time range)  acetaminophen (TYLENOL) tablet 650 mg (650 mg Oral Given 01/15/23 1852)  ibuprofen (ADVIL) tablet 600 mg (600 mg Oral Given 01/15/23 1852)  lactated ringers bolus 1,000 mL (0 mLs Intravenous Stopped 01/15/23 2149)    And  lactated ringers bolus 1,000 mL (1,000 mLs Intravenous New Bag/Given 01/15/23 2102)    And  lactated ringers bolus 250 mL (250 mLs Intravenous New Bag/Given 01/15/23 2103)       ED Triage Vitals  Enc Vitals Group     BP  01/15/23 1837 (!) 154/142     Pulse Rate 01/15/23 1837 90     Resp 01/15/23 1837 19     Temp 01/15/23 1843 (!) 101.1 F (38.4 C)     Temp Source 01/15/23 1843 Rectal     SpO2 01/15/23 1843 99 %     Weight 01/15/23 1826 159 lb 6.3 oz (72.3 kg)     Height 01/15/23 1826 5\' 5"  (1.651 m)     Head Circumference --      Peak Flow --      Pain Score --      Pain Loc --      Pain Edu? --      Excl. in GC? --   TMAX(24)@     _________________________________________ Significant initial  Findings: Abnormal Labs Reviewed  COMPREHENSIVE METABOLIC PANEL - Abnormal; Notable for the following components:      Result Value   Sodium 133 (*)    Chloride 95 (*)    Creatinine, Ser 1.16 (*)    Total Protein 6.4 (*)    Albumin 2.8 (*)    Alkaline Phosphatase 131 (*)    Total Bilirubin 1.4 (*)    GFR, Estimated 44 (*)    All other components within normal limits  CBC WITH DIFFERENTIAL/PLATELET - Abnormal; Notable for the following components:   WBC 15.2 (*)    RBC 3.48 (*)    Hemoglobin 10.4 (*)    HCT 33.4 (*)    RDW 17.8 (*)    Neutro Abs 13.0 (*)    Abs Immature Granulocytes 0.08 (*)    All other components within normal limits  PROTIME-INR - Abnormal; Notable for the following components:   Prothrombin Time 22.6 (*)    INR 2.0 (*)    All other components within normal limits  APTT - Abnormal; Notable for the following components:   aPTT 40 (*)    All other components within normal limits  URINALYSIS, W/ REFLEX TO CULTURE (INFECTION SUSPECTED) - Abnormal; Notable for the following components:   APPearance CLOUDY (*)    Hgb urine dipstick SMALL (*)    Nitrite POSITIVE (*)    Leukocytes,Ua LARGE (*)    Bacteria, UA MANY (*)    All other components within normal limits      _________________________  Cardiac Panel (last 3 results) Recent Labs    01/15/23 1842  CKTOTAL 34*    ECG: Ordered Personally reviewed and interpreted by me showing: HR : 90 Rhythm: A.fib. Right  ventricular hypertrophy Lateral infarct, age indeterminate QTC 459  BNP (last 3 results) Recent Labs    10/23/22 2152 11/01/22 0720  BNP 373.1* 329.2*     COVID-19 Labs  No results for input(s): "DDIMER", "FERRITIN", "LDH", "CRP" in the last 72 hours.  Lab Results  Component Value Date   SARSCOV2NAA NEGATIVE 01/15/2023   SARSCOV2NAA POSITIVE (A) 10/23/2022   SARSCOV2NAA NEGATIVE 11/15/2021   SARSCOV2NAA NEGATIVE 08/02/2020    ____________________ This patient meets SIRS Criteria and may be septic.    The recent clinical data is shown below. Vitals:   01/15/23 2000 01/15/23 2030 01/15/23 2103 01/15/23 2108  BP: (!) 104/57 (!) 92/52 (!) 85/50   Pulse: 78 80 80 79  Resp: (!) 28 20 (!) 22 17  Temp:      TempSrc:      SpO2: 100% 98% 99% 99%  Weight:      Height:        WBC     Component Value Date/Time   WBC 15.2 (H) 01/15/2023 1842   LYMPHSABS 1.1 01/15/2023 1842   MONOABS 0.9 01/15/2023 1842   EOSABS 0.0 01/15/2023 1842   BASOSABS 0.0 01/15/2023 1842    Lactic Acid, Venous    Component Value Date/Time   LATICACIDVEN 1.0 01/15/2023 1842    Procalcitonin   Ordered      UA   evidence of UTI      Urine analysis:    Component Value Date/Time   COLORURINE YELLOW 01/15/2023 1832   APPEARANCEUR CLOUDY (A) 01/15/2023 1832   LABSPEC 1.010 01/15/2023 1832   PHURINE 6.0 01/15/2023 1832   GLUCOSEU NEGATIVE 01/15/2023 1832   HGBUR SMALL (A) 01/15/2023 1832   BILIRUBINUR NEGATIVE 01/15/2023 1832   KETONESUR NEGATIVE 01/15/2023 1832   PROTEINUR NEGATIVE 01/15/2023 1832   NITRITE POSITIVE (A) 01/15/2023 1832   LEUKOCYTESUR LARGE (A) 01/15/2023 1832    Results for orders placed or performed during the hospital encounter of 01/15/23  Blood Culture (routine x 2)     Status: None (Preliminary result)   Collection Time: 01/15/23  6:41 PM   Specimen: BLOOD RIGHT ARM  Result Value Ref Range Status   Specimen Description   Final    BLOOD RIGHT ARM Performed at  [facility], [address], [place], [place] [address]    Special Requests   Final    BOTTLES DRAWN AEROBIC AND ANAEROBIC Blood Culture adequate volume Performed at [facility], [address], [place], [place] [address]    Culture PENDING  Incomplete   Report Status PENDING  Incomplete  Blood Culture (routine x 2)     Status: None (Preliminary result)   Collection Time: 01/15/23  6:43 PM   Specimen: BLOOD LEFT ARM  Result Value Ref Range Status   Specimen Description   Final    BLOOD LEFT ARM Performed at [facility], [address] [place], [place] [address]    Special Requests   Final    BOTTLES DRAWN AEROBIC AND ANAEROBIC Blood Culture adequate volume Performed at [facility], [address] [place], [place] [address]    Culture PENDING  Incomplete   Report Status PENDING  Incomplete  Resp panel by RT-PCR (RSV, Flu A&B, Covid) Anterior Nasal Swab  Status: None   Collection Time: 01/15/23  7:02 PM   Specimen: Anterior Nasal Swab  Result Value Ref Range Status   SARS Coronavirus 2 by RT PCR NEGATIVE NEGATIVE Final         Influenza A by PCR NEGATIVE NEGATIVE Final   Influenza B by PCR NEGATIVE NEGATIVE Final         Resp Syncytial Virus by PCR NEGATIVE NEGATIVE Final          ABX started Antibiotics Given (last 72 hours)     Date/Time Action Medication Dose Rate   01/15/23 2109 New Bag/Given   levofloxacin (LEVAQUIN) IVPB 750 mg 750 mg 100 mL/hr       No results found for the last 90 days.    __________________________________________________________ Recent Labs  Lab 01/15/23 1842  NA 133*  K 3.5  CO2 28  GLUCOSE 98  BUN 15  CREATININE 1.16*  CALCIUM 9.1    Cr    stable,   Lab Results  Component Value Date   CREATININE 1.16 (H) 01/15/2023   CREATININE 1.28 (H) 11/07/2022   CREATININE 1.34 (H) 11/06/2022    Recent Labs  Lab 01/15/23 1842  AST 30  ALT 14  ALKPHOS 131*  BILITOT 1.4*   PROT 6.4*  ALBUMIN 2.8*   Lab Results  Component Value Date   CALCIUM 9.1 01/15/2023   PHOS 3.5 11/07/2022    Plt: Lab Results  Component Value Date   PLT 189 01/15/2023       Recent Labs  Lab 01/15/23 1842  WBC 15.2*  NEUTROABS 13.0*  HGB 10.4*  HCT 33.4*  MCV 96.0  PLT 189    HG/HCT  stable,      Component Value Date/Time   HGB 10.4 (L) 01/15/2023 1842   HCT 33.4 (L) 01/15/2023 1842   MCV 96.0 01/15/2023 1842      _______________________________________________ Hospitalist was called for admission for   Acute cystitis without hematuria    Sepsis without acute organ dysfunction, due to unspecified organism [facility])    The following Work up has been ordered so far:  Orders Placed This Encounter  Procedures   Blood Culture (routine x 2)   Resp panel by RT-PCR (RSV, Flu A&B, Covid) Anterior Nasal Swab   Urine Culture   DG Chest Port 1 View   Lactic acid, plasma   Comprehensive metabolic panel   CBC with Differential   Protime-INR   APTT   Urinalysis, w/ Reflex to Culture (Infection Suspected) -Urine, Clean Catch   Diet NPO time specified   Document height and weight   Assess and Document Glasgow Coma Scale   Document vital signs within 1-hour of fluid bolus completion.  Notify provider of abnormal vital signs despite fluid resuscitation.   Refer to Sidebar Report: Sepsis Bundle ED/IP   Notify provider for difficulties obtaining IV access   Initiate Carrier Fluid Protocol   Check Rectal Temperature   Catherize if unable to void   DO NOT delay antibiotics if unable to obtain blood culture.   Code Sepsis activation.  This occurs automatically when order is signed and prioritizes pharmacy, lab, and radiology services for STAT collections and interventions.  If [facility] downtime, call [facility] [phone]) to activate Code Sepsis.   Consult to hospitalist   ED EKG 12-Lead   Insert peripheral IV X 1   Insert 2nd peripheral IV if not already present.     OTHER  Significant initial  Findings:  labs showing:  DM  labs:  HbA1C: No results for input(s): "HGBA1C" in the last 8760 hours.     CBG (last 3)  No results for input(s): "GLUCAP" in the last 72 hours.        Cultures:    Component Value Date/Time   SDES  01/15/2023 1843    BLOOD LEFT ARM Performed at [facility], [address], [place], [place] [address]    SPECREQUEST  01/15/2023 1843    BOTTLES DRAWN AEROBIC AND ANAEROBIC Blood Culture adequate volume Performed at [facility], [address] [place], [place] [address]    CULT PENDING 01/15/2023 1843   REPTSTATUS PENDING 01/15/2023 1843     Radiological Exams on Admission: CT ABDOMEN PELVIS WO CONTRAST  Result Date: 01/15/2023 CLINICAL DATA:  Complicated history with pyelonephritis suspected. EXAM: CT ABDOMEN AND PELVIS WITHOUT CONTRAST TECHNIQUE: Multidetector CT imaging of the abdomen and pelvis was performed following the standard protocol without IV contrast. RADIATION DOSE REDUCTION: This exam was performed according to the departmental dose-optimization program which includes automated exposure control, adjustment of the mA and/or kV according to patient size and/or use of iterative reconstruction technique. COMPARISON:  CT abdomen and pelvis 08/02/2020 FINDINGS: Lower chest: Trace bilateral pleural effusions peribronchial wall thickening with mucous plugging and patchy infiltrates in the lower lungs. Marked cardiomegaly. Hepatobiliary: Unremarkable noncontrast appearance of the liver. Unremarkable gallbladder and biliary tree. Pancreas: No acute abnormality. Spleen: Unremarkable. Adrenals/Urinary Tract: Stable adrenal glands. No urinary calculi. No hydronephrosis. Low-density cystic lesions compatible with benign cysts. No follow-up recommended. Mild bladder wall thickening may be due to underdistention or cystitis. Evaluation pyelonephritis is limited by lack of IV contrast. No perinephric  stranding. Stomach/Bowel: Normal caliber large and small bowel. Colonic diverticulosis about the sigmoid and descending colon. There is wall thickening, adjacent stranding, and small amount of free fluid about the distal descending and sigmoid colon compatible with diverticulitis. No organized fluid collection. No free air. Normal appendix.  Stomach is within normal limits. Vascular/Lymphatic: Advanced aortic atherosclerotic calcification. No lymphadenopathy. Reproductive: Hysterectomy. Other: No abdominal wall hernia. Musculoskeletal: No acute osseous abnormality. Advanced thoracolumbar spondylosis. Bilateral L5 pars defects with grade 1 spondylolisthesis. IMPRESSION: 1. Acute uncomplicated diverticulitis of the distal descending and sigmoid colon. 2. Mild bladder wall thickening may be due to underdistention or cystitis. Correlate with urinalysis. 3. Trace bilateral pleural effusions with bilateral lower lung infiltrates which may be due to edema given marked cardiomegaly though atypical infection is not excluded. 4. Peribronchial wall thickening and mucous plugging in the lower lungs. Aortic Atherosclerosis (ICD10-I70.0). Electronically Signed   By: Minerva Fester M.D.   On: 01/15/2023 23:04   DG Chest Port 1 View  Result Date: 01/15/2023 CLINICAL DATA:  Questionable sepsis, evaluate for abnormality. EXAM: PORTABLE CHEST 1 VIEW COMPARISON:  11/01/2022 FINDINGS: Stable cardiomegaly. Aortic atherosclerotic calcification. Chronic bronchitic changes and interstitial coarsening. Probable small bilateral pleural effusions. No pneumothorax. No definite displaced rib fractures. Advanced arthritis both shoulders. IMPRESSION: 1. Cardiomegaly and interstitial coarsening suggestive of edema. Chronic bronchitic lung changes or viral infection could appear similarly. 2. Small bilateral pleural effusions. Electronically Signed   By: Minerva Fester M.D.   On: 01/15/2023 19:25    _______________________________________________________________________________________________________ Latest  Blood pressure (!) 85/50, pulse 79, temperature 98.9 F (37.2 C), temperature source Oral, resp. rate 17, height 5\' 5"  (1.651 m), weight 72.3 kg, SpO2 99 %.   Vitals  labs and radiology finding personally reviewed  Review of Systems:    Pertinent positives  include:   Fevers, chills, fatigue  Constitutional:  No weight loss, night sweats,, weight loss  HEENT:  No headaches, Difficulty swallowing,Tooth/dental problems,Sore throat,  No sneezing, itching, ear ache, nasal congestion, post nasal drip,  Cardio-vascular:  No chest pain, Orthopnea, PND, anasarca, dizziness, palpitations.no Bilateral lower extremity swelling  GI:  No heartburn, indigestion, abdominal pain, nausea, vomiting, diarrhea, change in bowel habits, loss of appetite, melena, blood in stool, hematemesis Resp:  no shortness of breath at rest. No dyspnea on exertion, No excess mucus, no productive cough, No non-productive cough, No coughing up of blood.No change in color of mucus.No wheezing. Skin:  no rash or lesions. No jaundice GU:  no dysuria, change in color of urine, no urgency or frequency. No straining to urinate.  No flank pain.  Musculoskeletal:  No joint pain or no joint swelling. No decreased range of motion. No back pain.  Psych:  No change in mood or affect. No depression or anxiety. No memory loss.  Neuro: no localizing neurological complaints, no tingling, no weakness, no double vision, no gait abnormality, no slurred speech, no confusion  All systems reviewed and apart from HOPI all are negative _______________________________________________________________________________________________ Past Medical History:   Past Medical History:  Diagnosis Date   Anticoagulant long-term use    Arrhythmia    Arthritis    Atrial fibrillation (HCC)    Congestive heart failure (CHF) (HCC)     Dysphagia    Esophageal reflux    Essential hypertension    Hiatal hernia    Pulmonary embolus (HCC)    Thyroid disease       Past Surgical History:  Procedure Laterality Date   AMPUTATION TOE Right 11/17/2021   Procedure: RIGHT SECOND TOE AMPUTATION;  Surgeon: [name], MD;  Location: [facility];  Service: Orthopedics;  Laterality: Right;   COLONOSCOPY     ELBOW SURGERY     LAPAROSCOPIC HYSTERECTOMY      Social History:  Ambulatory   walker     reports that she quit smoking about 21 years ago. Her smoking use included cigarettes. She smoked an average of 1 pack per day. She has never used smokeless tobacco. No history on file for alcohol use and drug use.     Family History:  Family History  Problem Relation Age of Onset   Stroke Mother    Cancer Brother    ______________________________________________________________________________________________ Allergies: Allergies  Allergen Reactions   Amoxicillin Other (See Comments)    Unknown reaction    Azithromycin Other (See Comments)    Unknown reaction    Codeine Other (See Comments)    Unknown reaction    Erythromycin Other (See Comments)    Unknown reaction    Green Dyes Other (See Comments)    Allergic to ALL dyes   Iodine Other (See Comments)    Unknown reaction    Misc. Sulfonamide Containing Compounds    Oxycodone Other (See Comments)    Unknown reaction      Oxycodone-Acetaminophen Other (See Comments)    Unknown reaction    Penicillins Other (See Comments)    Unknown reaction    Sulfasalazine    Tramadol Hives     Prior to Admission medications   Medication Sig Start Date End Date Taking? Authorizing Provider  acetaminophen (TYLENOL) 325 MG tablet Take 2 tablets (650 mg total) by mouth every 6 (six) hours as needed for mild pain or headache. Patient taking differently: Take 650 mg by mouth every 6 (six) hours as needed  for mild pain, headache or fever. 11/06/22  Yes Almon Hercules, MD  bumetanide  (BUMEX) 0.5 MG tablet Take 0.5 mg by mouth 2 (two) times daily.   Yes [provider]  carboxymethylcellulose (REFRESH PLUS) 0.5 % SOLN Place 1 drop into both eyes 2 (two) times daily.   Yes [provider]  cholecalciferol (VITAMIN D3) 25 MCG (1000 UT) tablet Take 1,000 Units by mouth daily.   Yes [provider]  diclofenac Sodium (VOLTAREN) 1 % GEL Apply 4 g topically 4 (four) times daily. To bilateral feet and knees Patient taking differently: Apply 4 g topically 4 (four) times daily as needed (pain). To bilateral feet and knees 11/06/22  Yes Gonfa, Taye T, MD  gabapentin (NEURONTIN) 300 MG capsule Take 300 mg by mouth 2 (two) times daily.    Yes [provider]  levothyroxine (SYNTHROID) 100 MCG tablet Take 100 mcg by mouth every morning. 09/05/21  Yes [provider]  Multiple Vitamins-Minerals (PRESERVISION/LUTEIN) CAPS Take 1 capsule by mouth 2 (two) times daily.    Yes [provider]  pantoprazole (PROTONIX) 40 MG tablet Take 40 mg by mouth daily.   Yes [provider]  polyethylene glycol powder (MIRALAX) 17 GM/SCOOP powder Take 17 g by mouth 2 (two) times daily as needed for moderate constipation. 11/06/22  Yes Almon Hercules, MD  senna-docusate (SENOKOT-S) 8.6-50 MG tablet Take 1 tablet by mouth 2 (two) times daily between meals as needed for mild constipation. 11/06/22  Yes Almon Hercules, MD  spironolactone (ALDACTONE) 25 MG tablet Take 0.5 tablets (12.5 mg total) by mouth daily. 12/19/22  Yes Alver Sorrow, NP  vitamin B-12 (CYANOCOBALAMIN) 1000 MCG tablet Take 1,000 mcg by mouth daily.   Yes [provider]  warfarin (COUMADIN) 1 MG tablet Take 1-2 mg by mouth as directed. Take 2 tablets (2 mg) (Monday thru Friday) & Take 1 tablet (1 mg) on (Saturday & Sunday)   Yes [provider]    ___________________________________________________________________________________________________ Physical Exam:     01/15/2023    9:08 PM 01/15/2023    9:03 PM 01/15/2023    8:30 PM  Vitals with BMI  Systolic  85 92  Diastolic  50 52  Pulse 79 80 80    1. General:  in No  Acute distress   Chronically ill   -appearing 2. Psychological: Alert and   Oriented 3. Head/ENT:    Dry Mucous Membranes                          Head Non traumatic, neck supple                          Poor Dentition 4. SKIN decreased Skin turgor,  Skin clean Dry and intact no rash    5. Heart: Regular rate and rhythm no  Murmur, no Rub or gallop 6. Lungs:  no wheezes or crackles   7. Abdomen: Soft,  non-tender, Non distended bowel sounds present 8. Lower extremities: no clubbing, cyanosis, no  edema 9. Neurologically Grossly intact, moving all 4 extremities equally   10. MSK: Normal range of motion    Chart has been reviewed  ______________________________________________________________________________________________  Assessment/Plan 89 y.o. female with medical history significant of COPD, PE/ A.fib on coumadin, CKD 3b, PAD, chronic leg wounds, diastolic CHF, hypothyroidism Severe aortic stenosis   Admitted for   Acute cystitis without hematuria  Sepsis without acute organ dysfunction, due to unspecified organism [facility])    Present on Admission:  Hypothyroidism  Chronic kidney disease, stage 3b (HCC)  Chronic obstructive lung disease (HCC)  Sepsis (HCC)  Anemia  Permanent atrial fibrillation (HCC)  Uncontrolled hypertension  Hypomagnesemia  Personal history of pulmonary embolism  Leg wound, left  Diverticulitis large intestine w/o perforation or abscess w/o bleeding  Pressure injury of skin  UTI (urinary tract infection)  Chronic diastolic CHF (congestive heart failure) (HCC)   Hypothyroidism - Check TSH continue home medications Synthroid at po q day  Chronic kidney disease, stage 3b (HCC)  -chronic avoid nephrotoxic medications such as NSAIDs, Vanco Zosyn combo,  avoid hypotension, continue to  follow renal function   Chronic obstructive lung disease (HCC) Chronic stable  Sepsis (HCC)  -SIRS criteria met with  elevated white blood cell count,       Component Value Date/Time   WBC 15.2 (H) 01/15/2023 1842   LYMPHSABS 1.1 01/15/2023 1842       fever   RR >20 Today's Vitals   01/15/23 2000 01/15/23 2030 01/15/23 2103 01/15/23 2108  BP: (!) 104/57 (!) 92/52 (!) 85/50   Pulse: 78 80 80 79  Resp: (!) 28 20 (!) 22 17  Temp:      TempSrc:      SpO2: 100% 98% 99% 99%  Weight:      Height:         The recent clinical data is shown below. Vitals:   01/15/23 2000 01/15/23 2030 01/15/23 2103 01/15/23 2108  BP: (!) 104/57 (!) 92/52 (!) 85/50   Pulse: 78 80 80 79  Resp: (!) 28 20 (!) 22 17  Temp:      TempSrc:      SpO2: 100% 98% 99% 99%  Weight:      Height:         -Most likely source being:  urinary,        - Obtain serial lactic acid and procalcitonin level.  - Initiated IV antibiotics in ER: Antibiotics Given (last 72 hours)     Date/Time Action Medication Dose Rate   01/15/23 2109 New Bag/Given   levofloxacin (LEVAQUIN) IVPB 750 mg 750 mg 100 mL/hr       Will continue  on :levaquin   - await results of blood and urine culture  - Rehydrate aggressively  Intravenous fluids were administered,      10:11 PM   Anemia Obtain anemia panel in AM  Permanent atrial fibrillation (HCC) Contnue coumadin per pahrmacy  Uncontrolled hypertension Allow permissive htn  Hypomagnesemia Will replace and recheck  Personal history of pulmonary embolism Continue coumadin per pharmacy  Generalized weakness Will need PT OT assessment prior to dc  Leg wound, left Pressure wounds of left leg no evidence of infection at this time Cont to monitor order wound consult  Diverticulitis large intestine w/o perforation or abscess w/o bleeding   - no Evidence of perforation - Bowel rest clear liquid/   - Will rehydrate  - Continue IV antibiotics                  started on  cipro flagyl   01/16/23   Pressure injury of skin Wound care consult ordered    UTI (urinary tract infection) Patient recently been receiving ceftriaxone.  Will switch to Cipro Flagyl given associated diverticulitis await results of urine culture adjust antibiotics as needed  Chronic diastolic CHF (congestive heart failure) (HCC) -  currently appears to be slightly on the dry side, hold home diuretics for tonight and restart when appears euvolemic, carefuly follow fluid status and Cr    Other plan as per orders.  DVT prophylaxis:  coumadin   Code Status DNR/DNI  as per patient  I had personally discussed CODE STATUS with patient and family    ACP none  Family Communication:   Family   at  Bedside  plan of care was discussed with   Daughter   Diet  Diet Orders (From admission, onward)     Start     Ordered   01/15/23 1832  Diet NPO time specified  (Undifferentiated presentation (screening labs and basic nursing orders))  Diet effective now        01/15/23 1831            Disposition Plan:    To home once workup is complete and patient is stable   Following barriers for discharge:                            Electrolytes corrected                                                       Afebrile, white count improving able to transition to PO antibiotics         Consult Orders  (From admission, onward)           Start     Ordered   01/15/23 2146  Consult to hospitalist  Once       Provider:  (Not yet assigned)  Question Answer Comment  Place call to: [facility]   Reason for Consult Admit      01/15/23 2145                               Would benefit from PT/OT eval prior to DC  Ordered               Wound consult Consults called: none   Admission status:  ED Disposition     ED Disposition  Admit   Condition  --   Comment  The patient appears reasonably stabilized for admission considering the current resources, flow, and  capabilities available in the ED at this time, and I doubt any other [facility] requiring further screening and/or treatment in the ED prior to admission is  present.          Obs       Level of care  tele  24H     patient no longer qualifies COVID-19 Labs   Lab Results  Component Value Date   SARSCOV2NAA NEGATIVE 01/15/2023     Precautions: admitted as   Covid Negative        Kezia Benevides 01/16/2023, 12:45 AM    [facility]     after 2 AM please page floor coverage PA If 7AM-7PM, please contact the day team taking care of the patient using [email or web address]

## 2023-01-15 NOTE — Assessment & Plan Note (Signed)
-   Check TSH continue home medications Synthroid at 100 mcg po q day  

## 2023-01-15 NOTE — Assessment & Plan Note (Signed)
Allow permissive htn ?

## 2023-01-16 ENCOUNTER — Encounter (HOSPITAL_COMMUNITY): Payer: Self-pay | Admitting: Internal Medicine

## 2023-01-16 DIAGNOSIS — R0902 Hypoxemia: Secondary | ICD-10-CM | POA: Diagnosis present

## 2023-01-16 DIAGNOSIS — N390 Urinary tract infection, site not specified: Secondary | ICD-10-CM | POA: Diagnosis present

## 2023-01-16 DIAGNOSIS — I5032 Chronic diastolic (congestive) heart failure: Secondary | ICD-10-CM | POA: Diagnosis present

## 2023-01-16 DIAGNOSIS — E039 Hypothyroidism, unspecified: Secondary | ICD-10-CM | POA: Diagnosis present

## 2023-01-16 DIAGNOSIS — I130 Hypertensive heart and chronic kidney disease with heart failure and stage 1 through stage 4 chronic kidney disease, or unspecified chronic kidney disease: Secondary | ICD-10-CM | POA: Diagnosis present

## 2023-01-16 DIAGNOSIS — L89153 Pressure ulcer of sacral region, stage 3: Secondary | ICD-10-CM | POA: Diagnosis present

## 2023-01-16 DIAGNOSIS — K5732 Diverticulitis of large intestine without perforation or abscess without bleeding: Secondary | ICD-10-CM | POA: Diagnosis not present

## 2023-01-16 DIAGNOSIS — J449 Chronic obstructive pulmonary disease, unspecified: Secondary | ICD-10-CM | POA: Diagnosis present

## 2023-01-16 DIAGNOSIS — Z66 Do not resuscitate: Secondary | ICD-10-CM | POA: Diagnosis present

## 2023-01-16 DIAGNOSIS — R531 Weakness: Secondary | ICD-10-CM | POA: Diagnosis present

## 2023-01-16 DIAGNOSIS — N1832 Chronic kidney disease, stage 3b: Secondary | ICD-10-CM | POA: Diagnosis present

## 2023-01-16 DIAGNOSIS — L89623 Pressure ulcer of left heel, stage 3: Secondary | ICD-10-CM | POA: Diagnosis present

## 2023-01-16 DIAGNOSIS — Z8616 Personal history of COVID-19: Secondary | ICD-10-CM | POA: Diagnosis not present

## 2023-01-16 DIAGNOSIS — B965 Pseudomonas (aeruginosa) (mallei) (pseudomallei) as the cause of diseases classified elsewhere: Secondary | ICD-10-CM | POA: Diagnosis present

## 2023-01-16 DIAGNOSIS — L89613 Pressure ulcer of right heel, stage 3: Secondary | ICD-10-CM | POA: Diagnosis present

## 2023-01-16 DIAGNOSIS — I272 Pulmonary hypertension, unspecified: Secondary | ICD-10-CM | POA: Diagnosis present

## 2023-01-16 DIAGNOSIS — A419 Sepsis, unspecified organism: Secondary | ICD-10-CM | POA: Diagnosis present

## 2023-01-16 DIAGNOSIS — N3000 Acute cystitis without hematuria: Secondary | ICD-10-CM | POA: Diagnosis present

## 2023-01-16 DIAGNOSIS — I739 Peripheral vascular disease, unspecified: Secondary | ICD-10-CM | POA: Diagnosis present

## 2023-01-16 DIAGNOSIS — L8990 Pressure ulcer of unspecified site, unspecified stage: Secondary | ICD-10-CM | POA: Diagnosis present

## 2023-01-16 DIAGNOSIS — A4150 Gram-negative sepsis, unspecified: Secondary | ICD-10-CM | POA: Diagnosis not present

## 2023-01-16 DIAGNOSIS — K5150 Left sided colitis without complications: Secondary | ICD-10-CM | POA: Diagnosis present

## 2023-01-16 DIAGNOSIS — K449 Diaphragmatic hernia without obstruction or gangrene: Secondary | ICD-10-CM | POA: Diagnosis present

## 2023-01-16 DIAGNOSIS — K219 Gastro-esophageal reflux disease without esophagitis: Secondary | ICD-10-CM | POA: Diagnosis present

## 2023-01-16 DIAGNOSIS — Z1152 Encounter for screening for COVID-19: Secondary | ICD-10-CM | POA: Diagnosis not present

## 2023-01-16 DIAGNOSIS — I4821 Permanent atrial fibrillation: Secondary | ICD-10-CM | POA: Diagnosis present

## 2023-01-16 DIAGNOSIS — I350 Nonrheumatic aortic (valve) stenosis: Secondary | ICD-10-CM | POA: Diagnosis present

## 2023-01-16 DIAGNOSIS — D631 Anemia in chronic kidney disease: Secondary | ICD-10-CM | POA: Diagnosis present

## 2023-01-16 LAB — CBC WITH DIFFERENTIAL/PLATELET
Abs Immature Granulocytes: 0.04 10*3/uL (ref 0.00–0.07)
Basophils Absolute: 0 10*3/uL (ref 0.0–0.1)
Basophils Relative: 0 %
Eosinophils Absolute: 0.1 10*3/uL (ref 0.0–0.5)
Eosinophils Relative: 1 %
HCT: 28.2 % — ABNORMAL LOW (ref 36.0–46.0)
Hemoglobin: 8.9 g/dL — ABNORMAL LOW (ref 12.0–15.0)
Immature Granulocytes: 0 %
Lymphocytes Relative: 11 %
Lymphs Abs: 1.2 10*3/uL (ref 0.7–4.0)
MCH: 30.4 pg (ref 26.0–34.0)
MCHC: 31.6 g/dL (ref 30.0–36.0)
MCV: 96.2 fL (ref 80.0–100.0)
Monocytes Absolute: 0.9 10*3/uL (ref 0.1–1.0)
Monocytes Relative: 8 %
Neutro Abs: 8.2 10*3/uL — ABNORMAL HIGH (ref 1.7–7.7)
Neutrophils Relative %: 80 %
Platelets: 144 10*3/uL — ABNORMAL LOW (ref 150–400)
RBC: 2.93 MIL/uL — ABNORMAL LOW (ref 3.87–5.11)
RDW: 17.8 % — ABNORMAL HIGH (ref 11.5–15.5)
WBC: 10.4 10*3/uL (ref 4.0–10.5)
nRBC: 0 % (ref 0.0–0.2)

## 2023-01-16 LAB — PHOSPHORUS: Phosphorus: 3 mg/dL (ref 2.5–4.6)

## 2023-01-16 LAB — COMPREHENSIVE METABOLIC PANEL
ALT: 19 U/L (ref 0–44)
AST: 43 U/L — ABNORMAL HIGH (ref 15–41)
Albumin: 2.3 g/dL — ABNORMAL LOW (ref 3.5–5.0)
Alkaline Phosphatase: 107 U/L (ref 38–126)
Anion gap: 8 (ref 5–15)
BUN: 16 mg/dL (ref 8–23)
CO2: 28 mmol/L (ref 22–32)
Calcium: 8.5 mg/dL — ABNORMAL LOW (ref 8.9–10.3)
Chloride: 95 mmol/L — ABNORMAL LOW (ref 98–111)
Creatinine, Ser: 1.25 mg/dL — ABNORMAL HIGH (ref 0.44–1.00)
GFR, Estimated: 40 mL/min — ABNORMAL LOW (ref >60)
Glucose, Bld: 92 mg/dL (ref 70–99)
Potassium: 3.5 mmol/L (ref 3.5–5.1)
Sodium: 131 mmol/L — ABNORMAL LOW (ref 135–145)
Total Bilirubin: 1.3 mg/dL — ABNORMAL HIGH (ref 0.3–1.2)
Total Protein: 5.3 g/dL — ABNORMAL LOW (ref 6.5–8.1)

## 2023-01-16 LAB — PREALBUMIN: Prealbumin: 6 mg/dL — ABNORMAL LOW (ref 18–38)

## 2023-01-16 LAB — MAGNESIUM: Magnesium: 2.1 mg/dL (ref 1.7–2.4)

## 2023-01-16 LAB — PROTIME-INR
INR: 2.1 — ABNORMAL HIGH (ref 0.8–1.2)
Prothrombin Time: 24.1 seconds — ABNORMAL HIGH (ref 11.4–15.2)

## 2023-01-16 LAB — CULTURE, BLOOD (ROUTINE X 2): Culture: NO GROWTH

## 2023-01-16 LAB — OSMOLALITY, URINE: Osmolality, Ur: 335 mOsm/kg (ref 300–900)

## 2023-01-16 LAB — OSMOLALITY: Osmolality: 280 mOsm/kg (ref 275–295)

## 2023-01-16 MED ORDER — ACETAMINOPHEN 325 MG PO TABS
650.0000 mg | ORAL_TABLET | Freq: Four times a day (QID) | ORAL | Status: DC | PRN
  Administered 2023-01-16 – 2023-01-21 (×2): 650 mg via ORAL
  Filled 2023-01-16 (×3): qty 2

## 2023-01-16 MED ORDER — LACTATED RINGERS IV SOLN: INTRAVENOUS | Status: AC

## 2023-01-16 MED ORDER — ONDANSETRON HCL 4 MG/2ML IJ SOLN: 4.0000 mg | Freq: Four times a day (QID) | INTRAMUSCULAR | Status: DC | PRN

## 2023-01-16 MED ORDER — SENNOSIDES-DOCUSATE SODIUM 8.6-50 MG PO TABS
1.0000 | ORAL_TABLET | Freq: Two times a day (BID) | ORAL | Status: DC | PRN
  Administered 2023-01-19: 1 via ORAL
  Filled 2023-01-16: qty 1

## 2023-01-16 MED ORDER — VITAMIN C 500 MG PO TABS
250.0000 mg | ORAL_TABLET | Freq: Two times a day (BID) | ORAL | Status: DC
  Administered 2023-01-16 – 2023-01-21 (×11): 250 mg via ORAL
  Filled 2023-01-16 (×11): qty 1

## 2023-01-16 MED ORDER — POLYETHYLENE GLYCOL 3350 17 G PO PACK
17.0000 g | PACK | Freq: Two times a day (BID) | ORAL | Status: DC | PRN
  Administered 2023-01-16 – 2023-01-19 (×2): 17 g via ORAL
  Filled 2023-01-16 (×2): qty 1

## 2023-01-16 MED ORDER — ONDANSETRON HCL 4 MG PO TABS: 4.0000 mg | ORAL_TABLET | Freq: Four times a day (QID) | ORAL | Status: DC | PRN

## 2023-01-16 MED ORDER — CIPROFLOXACIN IN D5W 400 MG/200ML IV SOLN: 400.0000 mg | Freq: Two times a day (BID) | INTRAVENOUS | Status: DC

## 2023-01-16 MED ORDER — METRONIDAZOLE 500 MG/100ML IV SOLN
500.0000 mg | Freq: Two times a day (BID) | INTRAVENOUS | Status: DC
  Administered 2023-01-16 – 2023-01-17 (×3): 500 mg via INTRAVENOUS
  Filled 2023-01-16 (×3): qty 100

## 2023-01-16 MED ORDER — CIPROFLOXACIN IN D5W 400 MG/200ML IV SOLN: 400.0000 mg | INTRAVENOUS | Status: DC

## 2023-01-16 MED ORDER — MEDIHONEY WOUND/BURN DRESSING EX PSTE
1.0000 | PASTE | Freq: Every day | CUTANEOUS | Status: DC
  Administered 2023-01-16 – 2023-01-21 (×6): 1 via TOPICAL
  Filled 2023-01-16: qty 44

## 2023-01-16 MED ORDER — SODIUM CHLORIDE 0.9 % IV SOLN
2.0000 g | INTRAVENOUS | Status: DC
  Administered 2023-01-16: 2 g via INTRAVENOUS
  Filled 2023-01-16 (×2): qty 20

## 2023-01-16 MED ORDER — PANTOPRAZOLE SODIUM 40 MG PO TBEC
40.0000 mg | DELAYED_RELEASE_TABLET | Freq: Every day | ORAL | Status: DC
  Administered 2023-01-16 – 2023-01-21 (×6): 40 mg via ORAL
  Filled 2023-01-16 (×6): qty 1

## 2023-01-16 MED ORDER — GABAPENTIN 300 MG PO CAPS
300.0000 mg | ORAL_CAPSULE | Freq: Two times a day (BID) | ORAL | Status: DC
  Administered 2023-01-16 – 2023-01-21 (×12): 300 mg via ORAL
  Filled 2023-01-16 (×12): qty 1

## 2023-01-16 MED ORDER — WARFARIN SODIUM 1 MG PO TABS
1.5000 mg | ORAL_TABLET | Freq: Once | ORAL | Status: AC
  Administered 2023-01-16: 1.5 mg via ORAL
  Filled 2023-01-16: qty 1

## 2023-01-16 MED ORDER — ZINC SULFATE 220 (50 ZN) MG PO CAPS
220.0000 mg | ORAL_CAPSULE | Freq: Every day | ORAL | Status: DC
  Administered 2023-01-16 – 2023-01-21 (×6): 220 mg via ORAL
  Filled 2023-01-16 (×6): qty 1

## 2023-01-16 MED ORDER — LEVOTHYROXINE SODIUM 100 MCG PO TABS
100.0000 ug | ORAL_TABLET | Freq: Every morning | ORAL | Status: DC
  Administered 2023-01-16 – 2023-01-21 (×6): 100 ug via ORAL
  Filled 2023-01-16 (×6): qty 1

## 2023-01-16 MED ORDER — ADULT MULTIVITAMIN W/MINERALS CH
1.0000 | ORAL_TABLET | Freq: Every day | ORAL | Status: DC
  Administered 2023-01-16 – 2023-01-20 (×5): 1 via ORAL
  Filled 2023-01-16 (×6): qty 1

## 2023-01-16 MED ORDER — ENSURE ENLIVE PO LIQD
237.0000 mL | Freq: Two times a day (BID) | ORAL | Status: DC
  Administered 2023-01-16 – 2023-01-18 (×3): 237 mL via ORAL

## 2023-01-16 NOTE — [place] FL2 (Signed)
[place] MEDICAID FL2 LEVEL OF CARE FORM     IDENTIFICATION  Patient Name: [name] Birthdate: 1927-10-14 Sex: female Admission Date (Current Location): 01/15/2023  [facility] and [place] Number:  Producer, television/film/video and Address:  [facility],  [address] [place], [place] [address]      Provider Number: [id number]  Attending Physician Name and Address:  Dorcas Carrow, MD  Relative Name and Phone Number:  Kerrin Mo (Daughter) [phone]    Current Level of Care: Hospital Recommended Level of Care: Skilled Nursing Facility Prior Approval Number:    Date Approved/Denied:   PASRR Number: [id number]  Discharge Plan: SNF    Current Diagnoses: Patient Active Problem List   Diagnosis Date Noted   Diverticulitis large intestine w/o perforation or abscess w/o bleeding 01/16/2023   Pressure injury of skin 01/16/2023   UTI (urinary tract infection) 01/16/2023   Sepsis (HCC) 01/15/2023   Hypomagnesemia 01/15/2023   Leg wound, left 01/15/2023   Severe tricuspid valve regurgitation 11/07/2022   Severe aortic stenosis 10/26/2022   Pneumonia due to COVID-19 virus 10/24/2022   COVID-19 10/23/2022   Hypoxia 10/23/2022   Positive blood culture 11/20/2021   Generalized weakness 11/20/2021   Thrombocytopenia (HCC) 11/17/2021   Anemia 11/17/2021   AKI on CKD-3B 11/17/2021   Osteomyelitis (HCC) 11/16/2021   Chronic diastolic CHF (congestive heart failure) (HCC) 11/16/2021   Hyponatremia 11/16/2021   Chronic kidney disease, stage 3b (HCC) 11/16/2021   Leukocytosis 11/16/2021   Uncontrolled hypertension 11/15/2021   Hyperthyroidism 11/15/2021   Peripheral venous insufficiency 11/15/2021   Personal history of pulmonary embolism 11/15/2021   Osteomyelitis of second toe of right foot (HCC) 11/15/2021   Cellulitis 11/15/2021   Chronic thromboembolic pulmonary hypertension (HCC) 05/12/2014   Permanent atrial fibrillation (HCC) 06/20/2009   Chronic obstructive  lung disease (HCC) 04/04/2009   Gastroesophageal reflux disease 12/25/2008   Generalized anxiety disorder 12/25/2008   Hypercholesterolemia 12/25/2008   Hypothyroidism 12/25/2008    Orientation RESPIRATION BLADDER Height & Weight     Self, Place  O2 (02 via Tina) Continent Weight: 72.3 kg Height:  5\' 5"  (165.1 cm)  BEHAVIORAL SYMPTOMS/MOOD NEUROLOGICAL BOWEL NUTRITION STATUS      Continent Diet (Heart healthy diet)  AMBULATORY STATUS COMMUNICATION OF NEEDS Skin   Extensive Assist Verbally PU Stage and Appropriate Care (Left Heel Stage 3-foam dressing; Right Heel pressure injury- medihoney and foam dressing; Sacrum deep pressure injury-foam dressing)     PU Stage 3 Dressing:  (prn)                 Personal Care Assistance Level of Assistance  Bathing, Feeding, Dressing Bathing Assistance: Limited assistance Feeding assistance: Limited assistance Dressing Assistance: Limited assistance     Functional Limitations Info  Sight, Hearing, Speech Sight Info: Impaired (glasses) Hearing Info: Adequate Speech Info: Adequate    SPECIAL CARE FACTORS FREQUENCY  PT (By licensed PT), OT (By licensed OT)     PT Frequency: 5x/wk OT Frequency: 5x/wk            Contractures Contractures Info: Not present    Additional Factors Info  Code Status, Allergies, Psychotropic Code Status Info: DNR Allergies Info: Amoxicillin, Azithromycin, Codeine, Erythromycin, Green Dyes, Iodine, Misc. Sulfonamide Containing Compounds, Oxycodone, Oxycodone-acetaminophen, Penicillins, Sulfasalazine, Tramadol Psychotropic Info: N/A         Current Medications (01/16/2023):  This is the current hospital active medication list Current Facility-Administered Medications  Medication Dose Route Frequency Provider Last Rate Last Admin  acetaminophen (TYLENOL) tablet 650 mg  650 mg Oral Q6H PRN Dorcas Carrow, MD   650 mg at 01/16/23 1351   ascorbic acid (VITAMIN C) tablet 250 mg  250 mg Oral BID Dorcas Carrow, MD   250 mg at 01/16/23 1321   cefTRIAXone (ROCEPHIN) 2 g in sodium chloride 0.9 % 100 mL IVPB  2 g Intravenous Q24H Dorcas Carrow, MD 200 mL/hr at 01/16/23 1002 2 g at 01/16/23 1002   feeding supplement (ENSURE ENLIVE / ENSURE PLUS) liquid 237 mL  237 mL Oral BID BM Dorcas Carrow, MD   237 mL at 01/16/23 1321   gabapentin (NEURONTIN) capsule 300 mg  300 mg Oral BID Therisa Doyne, MD   300 mg at 01/16/23 1610   lactated ringers infusion  150 mL/hr Intravenous Continuous Therisa Doyne, MD   Stopped at 01/16/23 0058   lactated ringers infusion   Intravenous Continuous Doutova, Anastassia, MD 75 mL/hr at 01/16/23 0130 Rate Change at 01/16/23 0130   leptospermum manuka honey (MEDIHONEY) paste 1 Application  1 Application Topical Daily Therisa Doyne, MD   1 Application at 01/16/23 0959   levothyroxine (SYNTHROID) tablet 100 mcg  100 mcg Oral q morning Doutova, Anastassia, MD   100 mcg at 01/16/23 0545   metroNIDAZOLE (FLAGYL) IVPB 500 mg  500 mg Intravenous Q12H Doutova, Anastassia, MD 100 mL/hr at 01/16/23 1220 500 mg at 01/16/23 1220   multivitamin with minerals tablet 1 tablet  1 tablet Oral Daily Dorcas Carrow, MD   1 tablet at 01/16/23 1321   ondansetron (ZOFRAN) tablet 4 mg  4 mg Oral Q6H PRN Therisa Doyne, MD       Or   ondansetron (ZOFRAN) injection 4 mg  4 mg Intravenous Q6H PRN Doutova, Anastassia, MD       pantoprazole (PROTONIX) EC tablet 40 mg  40 mg Oral Daily Doutova, Anastassia, MD   40 mg at 01/16/23 0959   polyethylene glycol (MIRALAX / GLYCOLAX) packet 17 g  17 g Oral BID PRN Therisa Doyne, MD   17 g at 01/16/23 0546   senna-docusate (Senokot-S) tablet 1 tablet  1 tablet Oral BID BM PRN Doutova, Anastassia, MD       warfarin (COUMADIN) tablet 1.5 mg  1.5 mg Oral ONCE-1600 Wofford, Drew A, RPH       Warfarin - Pharmacist Dosing Inpatient   Does not apply q1600 Poindexter, Leann T, RPH       zinc sulfate capsule 220 mg  220 mg Oral Daily Dorcas Carrow, MD   220 mg at 01/16/23 1321     Discharge Medications: Please see discharge summary for a list of discharge medications.  Relevant Imaging Results:  Relevant Lab Results:   Additional Information SSN: [id number]  Howell Rucks, RN

## 2023-01-16 NOTE — Evaluation (Signed)
Physical Therapy Evaluation Patient Details Name: [name] MRN: [id number] DOB: 1928/06/24 Today's Date: 01/16/2023  History of Present Illness  89 y.o. female with medical history significant of COPD, PE/ A.fib on coumadin, CKD 3b, PAD, chronic leg wounds, diastolic CHF, hypothyroidism Severe aortic stenosis. Pt had recent admission for Covid 2/15-11/07/22, DCed to SNF, home since March. Admitted for   Acute cystitis without hematuria, Sepsis  Clinical Impression  Pt admitted with above diagnosis. MIn assist for stand pivot transfer from recliner to bedside commode then back to recliner. Verbal cues for safe hand placement. Pt declined ambulation attempt 2* fatigue. She would benefit from 24/7 assist due to risk for falls.  Pt currently with functional limitations due to the deficits listed below (see PT Problem List). Pt will benefit from acute skilled PT to increase their independence and safety with mobility to allow discharge.          Recommendations for follow up therapy are one component of a multi-disciplinary discharge planning process, led by the attending physician.  Recommendations may be updated based on patient status, additional functional criteria and insurance authorization.  Follow Up Recommendations Can patient physically be transported by private vehicle: No     Assistance Recommended at Discharge Intermittent Supervision/Assistance  Patient can return home with the following  A lot of help with walking and/or transfers;A lot of help with bathing/dressing/bathroom;Assistance with cooking/housework;Assist for transportation;Help with stairs or ramp for entrance    Equipment Recommendations None recommended by PT  Recommendations for Other Services       Functional Status Assessment Patient has had a recent decline in their functional status and demonstrates the ability to make significant improvements in function in a reasonable and predictable amount of time.      Precautions / Restrictions Precautions Precautions: Fall Restrictions Weight Bearing Restrictions: No      Mobility  Bed Mobility               General bed mobility comments: up in recliner    Transfers Overall transfer level: Needs assistance Equipment used: Rolling walker (2 wheels) Transfers: Sit to/from Stand, Bed to chair/wheelchair/BSC Sit to Stand: Min assist   Step pivot transfers: Min assist       General transfer comment: Required cues for hand placment, pt held RW on middle cross bar with 1 hand; SPT to bedside commode then to recliner    Ambulation/Gait               General Gait Details: deferred 2* fatigue with SPT  Stairs            Wheelchair Mobility    Modified Rankin (Stroke Patients Only)       Balance   Sitting-balance support: Feet supported, No upper extremity supported Sitting balance-Leahy Scale: Fair Sitting balance - Comments: static sitting-good. dynamic sitting-fair+     Standing balance-Leahy Scale: Poor                               Pertinent Vitals/Pain Pain Assessment Pain Assessment: Faces Faces Pain Scale: Hurts even more Pain Location: L heel Pain Descriptors / Indicators: Sore Pain Intervention(s): Limited activity within patient's tolerance, Monitored during session, Repositioned    Home Living Family/patient expects to be discharged to:: Private residence Living Arrangements: Children Available Help at Discharge: Family;Available 24 hours/day Type of Home: House Home Access: Level entry       Home Layout: One level  Home Equipment: Agricultural consultant (2 wheels);BSC/3in1      Prior Function Prior Level of Function : Needs assist             Mobility Comments: per chart, pt uses RW for mobility ADLs Comments: per prior admission info, pt min assist for ADLs, sponge bathes at baseline w help from daughter. pt poor historian so unable to provide accurate info     Hand  Dominance   Dominant Hand: Right    Extremity/Trunk Assessment   Upper Extremity Assessment Upper Extremity Assessment: Defer to OT evaluation RUE Deficits / Details: Chronic appearing shoulder AROM limitations with AROM for shoulder flexion <90 degrees. Elbow and hand AROM WFL. Functional grip strength LUE Deficits / Details: Chronic appearing shoulder AROM limitations with AROM for shoulder flexion <90 degrees. Elbow and hand AROM WFL. Functional grip strength    Lower Extremity Assessment Lower Extremity Assessment: Overall WFL for tasks assessed    Cervical / Trunk Assessment Cervical / Trunk Assessment: Normal  Communication   Communication: No difficulties  Cognition Arousal/Alertness: Awake/alert Behavior During Therapy: WFL for tasks assessed/performed Overall Cognitive Status: No family/caregiver present to determine baseline cognitive functioning                                 General Comments: decreased STM        General Comments      Exercises General Exercises - Lower Extremity Long Arc Quad: AROM, Both, 10 reps Hip Flexion/Marching: AROM, Both, 10 reps, Seated   Assessment/Plan    PT Assessment Patient needs continued PT services  PT Problem List Decreased activity tolerance;Decreased balance;Decreased mobility;Decreased cognition       PT Treatment Interventions DME instruction;Gait training;Therapeutic exercise;Therapeutic activities;Patient/family education;Functional mobility training    PT Goals (Current goals can be found in the Care Plan section)  Acute Rehab PT Goals Patient Stated Goal: to go home PT Goal Formulation: With patient Time For Goal Achievement: 01/30/23 Potential to Achieve Goals: Fair    Frequency Min 1X/week     Co-evaluation               AM-PAC PT "6 Clicks" Mobility  Outcome Measure Help needed turning from your back to your side while in a flat bed without using bedrails?: A Little Help needed  moving from lying on your back to sitting on the side of a flat bed without using bedrails?: A Little Help needed moving to and from a bed to a chair (including a wheelchair)?: A Little Help needed standing up from a chair using your arms (e.g., wheelchair or bedside chair)?: A Little Help needed to walk in hospital room?: A Lot Help needed climbing 3-5 steps with a railing? : Total 6 Click Score: 15    End of Session Equipment Utilized During Treatment: Gait belt Activity Tolerance: Patient limited by fatigue Patient left: in chair;with chair alarm set;with call bell/phone within reach Nurse Communication: Mobility status PT Visit Diagnosis: Unsteadiness on feet (R26.81);Difficulty in walking, not elsewhere classified (R26.2);Pain Pain - Right/Left: Left Pain - part of body: Ankle and joints of foot    Time: 0981-1914 PT Time Calculation (min) (ACUTE ONLY): 32 min   Charges:   PT Evaluation $PT Eval Moderate Complexity: 1 Mod PT Treatments $Therapeutic Activity: 8-22 mins       Ralene Bathe Kistler PT 01/16/2023  Acute Rehabilitation Services  Office [phone]

## 2023-01-16 NOTE — Progress Notes (Signed)
PROGRESS NOTE    [name]  WUJ:[id number] DOB: [birth year] DOA: 01/15/2023 PCP: Camie Patience, FNP    Brief Narrative:  89 year old female with chronic multiple medical issues, history significant for COPD and chronic hypoxemia on 2 L oxygen at home, paroxysmal atrial fibrillation on Coumadin, pulmonary embolism, hypothyroidism, severe aortic stenosis who presents from home with fever.  Reportedly she had a temperature at home, Tylenol was given she did not respond so brought to the ER.  She recently finished cefdinir for UTI.  Patient was recently admitted to the hospital and went to a SNF and subsequently going home with her daughter.  Urinalysis was abnormal.  CT scan abdomen pelvis also showed uncomplicated left-sided colitis.   Assessment & Plan:   Acute UTI without hematuria, sepsis present on admission with presence of temperature one 1.1, blood pressure 85/50, respiratory 33 and WBC count of 15.2. 2 possible source of infection.  UTI and left-sided colitis. Patient will continue Rocephin and Flagyl today.  She does not have much abdominal symptoms, will advance to regular diet as tolerated. Urine cultures pending.  Blood cultures pending.  Continue antibiotics until clinical improvement.  Chronic medical issues including Hypothyroidism, on Synthroid.  Continued. CKD stage IIIb, at about her baseline.  Monitor on treatment. COPD and chronic hypoxemia, chronic and stable. Multiple ulcers, not infected.  Continue wound care. Permanent atrial fibrillation, on Coumadin therapy. Hypomagnesemia, replace aggressively.  Pressure Injury 01/16/23 Heel Left Stage 3 -  Full thickness tissue loss. Subcutaneous fat may be visible but bone, tendon or muscle are NOT exposed. (Active)  01/16/23 0022  Location: Heel  Location Orientation: Left  Staging: Stage 3 -  Full thickness tissue loss. Subcutaneous fat may be visible but bone, tendon or muscle are NOT exposed.  Wound Description  (Comments):   Present on Admission: Yes     Pressure Injury 01/16/23 Heel Right Deep Tissue Pressure Injury - Purple or maroon localized area of discolored intact skin or blood-filled blister due to damage of underlying soft tissue from pressure and/or shear. (Active)  01/16/23 0023  Location: Heel  Location Orientation: Right  Staging: Deep Tissue Pressure Injury - Purple or maroon localized area of discolored intact skin or blood-filled blister due to damage of underlying soft tissue from pressure and/or shear.  Wound Description (Comments):   Present on Admission:      Pressure Injury 01/16/23 Sacrum Deep Tissue Pressure Injury - Purple or maroon localized area of discolored intact skin or blood-filled blister due to damage of underlying soft tissue from pressure and/or shear. (Active)  01/16/23 0025  Location: Sacrum  Location Orientation:   Staging: Deep Tissue Pressure Injury - Purple or maroon localized area of discolored intact skin or blood-filled blister due to damage of underlying soft tissue from pressure and/or shear.  Wound Description (Comments):   Present on Admission: Yes       DVT prophylaxis:  warfarin (COUMADIN) tablet 1.5 mg   Code Status: DNR Family Communication: Unable to talk to her daughter Disposition Plan: Status is: Inpatient Remains inpatient appropriate because: IV antibiotics,     Consultants:  None  Procedures:  None  Antimicrobials:  Levaquin and Flagyl 5/9--- 5/10 Rocephin and Flagyl 5/10---   Subjective: Patient examined in the morning rounds.  She was trying to work with physical therapy.  Needed 2 people to help her out of bed.  Denies any complaints.  Denies any nausea vomiting or abdominal pain.  No family at the bedside.  She  is on liquid diet and wants some real food.  Objective: Vitals:   01/16/23 0333 01/16/23 0737 01/16/23 0858 01/16/23 1133  BP: 100/61 (!) 91/57 (!) 117/55 110/68  Pulse: 70 63 68 (!) 55  Resp: 19 (!) 24   (!) 24  Temp: 98.5 F (36.9 C) 97.9 F (36.6 C) 98 F (36.7 C) 97.9 F (36.6 C)  TempSrc: Oral Oral  Oral  SpO2: 93% 100% 97% 100%  Weight:      Height:        Intake/Output Summary (Last 24 hours) at 01/16/2023 1404 Last data filed at 01/16/2023 7425 Gross per 24 hour  Intake 2827.5 ml  Output 100 ml  Net 2727.5 ml   Filed Weights   01/15/23 1826  Weight: 72.3 kg    Examination:  General exam: Chronically sick looking.  Frail and debilitated.  Not in any distress. Looks fairly comfortable sitting in the bed, currently on 2 L of oxygen. Respiratory system: No added sounds. Cardiovascular system: S1 & S2 heard, RRR. No pedal edema. Gastrointestinal system: Abdomen is nondistended, soft and nontender. No organomegaly or masses felt. Normal bowel sounds heard. Central nervous system: Alert and oriented. No focal neurological deficits.  Gross generalized weakness. Extremities:  Multiple pressure ulcers along with large ulcer on the left heel as documented above.    Data Reviewed: I have personally reviewed following labs and imaging studies  CBC: Recent Labs  Lab 01/15/23 1842 01/16/23 0526  WBC 15.2* 10.4  NEUTROABS 13.0* 8.2*  HGB 10.4* 8.9*  HCT 33.4* 28.2*  MCV 96.0 96.2  PLT 189 144*   Basic Metabolic Panel: Recent Labs  Lab 01/15/23 1842 01/16/23 0526  NA 133* 131*  K 3.5 3.5  CL 95* 95*  CO2 28 28  GLUCOSE 98 92  BUN 15 16  CREATININE 1.16* 1.25*  CALCIUM 9.1 8.5*  MG 1.6* 2.1  PHOS 2.7 3.0   GFR: Estimated Creatinine Clearance: 28.6 mL/min (A) (by C-G formula based on SCr of 1.25 mg/dL (H)). Liver Function Tests: Recent Labs  Lab 01/15/23 1842 01/16/23 0526  AST 30 43*  ALT 14 19  ALKPHOS 131* 107  BILITOT 1.4* 1.3*  PROT 6.4* 5.3*  ALBUMIN 2.8* 2.3*   No results for input(s): "LIPASE", "AMYLASE" in the last 168 hours. No results for input(s): "AMMONIA" in the last 168 hours. Coagulation Profile: Recent Labs  Lab 01/15/23 1842  01/16/23 0526  INR 2.0* 2.1*   Cardiac Enzymes: Recent Labs  Lab 01/15/23 1842  CKTOTAL 34*   BNP (last 3 results) No results for input(s): "PROBNP" in the last 8760 hours. HbA1C: No results for input(s): "HGBA1C" in the last 72 hours. CBG: No results for input(s): "GLUCAP" in the last 168 hours. Lipid Profile: No results for input(s): "CHOL", "HDL", "LDLCALC", "TRIG", "CHOLHDL", "LDLDIRECT" in the last 72 hours. Thyroid Function Tests: Recent Labs    01/15/23 1842  TSH 0.997   Anemia Panel: Recent Labs    01/15/23 1842  VITAMINB12 784  FOLATE 9.5  FERRITIN 162  TIBC 204*  IRON 14*  RETICCTPCT 1.1   Sepsis Labs: Recent Labs  Lab 01/15/23 1842  PROCALCITON 0.10  LATICACIDVEN 1.0    Recent Results (from the past 240 hour(s))  Blood Culture (routine x 2)     Status: None (Preliminary result)   Collection Time: 01/15/23  6:41 PM   Specimen: BLOOD RIGHT ARM  Result Value Ref Range Status   Specimen Description   Final  BLOOD RIGHT ARM Performed at [facility], [address], [place], [place] [address]    Special Requests   Final    BOTTLES DRAWN AEROBIC AND ANAEROBIC Blood Culture adequate volume Performed at [facility], [address] [place], [place] [address]    Culture   Final    NO GROWTH < 12 HOURS Performed at [facility], [address], [place], [place] [address]    Report Status PENDING  Incomplete  Blood Culture (routine x 2)     Status: None (Preliminary result)   Collection Time: 01/15/23  6:43 PM   Specimen: BLOOD LEFT ARM  Result Value Ref Range Status   Specimen Description   Final    BLOOD LEFT ARM Performed at [facility], [address], [place], [place] [address]    Special Requests   Final    BOTTLES DRAWN AEROBIC AND ANAEROBIC Blood Culture adequate volume Performed at [facility], [address], [place], [place] [address]    Culture   Final    NO GROWTH < 12  HOURS Performed at [facility], [address] [place], [place] [address]    Report Status PENDING  Incomplete  Resp panel by RT-PCR (RSV, Flu A&B, Covid) Anterior Nasal Swab     Status: None   Collection Time: 01/15/23  7:02 PM   Specimen: Anterior Nasal Swab  Result Value Ref Range Status   SARS Coronavirus 2 by RT PCR NEGATIVE NEGATIVE Final    Comment: (NOTE) SARS-CoV-2 target nucleic acids are NOT DETECTED.  The SARS-CoV-2 RNA is generally detectable in upper respiratory specimens during the acute phase of infection. The lowest concentration of SARS-CoV-2 viral copies this assay can detect is 138 copies/mL. A negative result does not preclude SARS-Cov-2 infection and should not be used as the sole basis for treatment or other patient management decisions. A negative result may occur with  improper specimen collection/handling, submission of specimen other than nasopharyngeal swab, presence of viral mutation(s) within the areas targeted by this assay, and inadequate number of viral copies(<138 copies/mL). A negative result must be combined with clinical observations, patient history, and epidemiological information. The expected result is Negative.  Fact Sheet for Patients:  BloggerCourse.com  Fact Sheet for Healthcare Providers:  SeriousBroker.it  This test is no t yet approved or cleared by the [place] FDA and  has been authorized for detection and/or diagnosis of SARS-CoV-2 by FDA under an Emergency Use Authorization (EUA). This EUA will remain  in effect (meaning this test can be used) for the duration of the COVID-19 declaration under Section 564(b)(1) of the Act, 21 U.S.C.section 360bbb-3(b)(1), unless the authorization is terminated  or revoked sooner.       Influenza A by PCR NEGATIVE NEGATIVE Final   Influenza B by PCR NEGATIVE NEGATIVE Final    Comment: (NOTE) The Xpert Xpress SARS-CoV-2/FLU/RSV  plus assay is intended as an aid in the diagnosis of influenza from Nasopharyngeal swab specimens and should not be used as a sole basis for treatment. Nasal washings and aspirates are unacceptable for Xpert Xpress SARS-CoV-2/FLU/RSV testing.  Fact Sheet for Patients: BloggerCourse.com  Fact Sheet for Healthcare Providers: SeriousBroker.it  This test is not yet approved or cleared by the [place] FDA and has been authorized for detection and/or diagnosis of SARS-CoV-2 by FDA under an Emergency Use Authorization (EUA). This EUA will remain in effect (meaning this test can be used) for  the duration of the COVID-19 declaration under Section 564(b)(1) of the Act, 21 U.S.C. section 360bbb-3(b)(1), unless the authorization is terminated or revoked.     Resp Syncytial Virus by PCR NEGATIVE NEGATIVE Final    Comment: (NOTE) Fact Sheet for Patients: BloggerCourse.com  Fact Sheet for Healthcare Providers: SeriousBroker.it  This test is not yet approved or cleared by the [place] FDA and has been authorized for detection and/or diagnosis of SARS-CoV-2 by FDA under an Emergency Use Authorization (EUA). This EUA will remain in effect (meaning this test can be used) for the duration of the COVID-19 declaration under Section 564(b)(1) of the Act, 21 U.S.C. section 360bbb-3(b)(1), unless the authorization is terminated or revoked.  Performed at [facility], [address], [place], [place] [address]          Radiology Studies: CT ABDOMEN PELVIS WO CONTRAST  Result Date: 01/15/2023 CLINICAL DATA:  Complicated history with pyelonephritis suspected. EXAM: CT ABDOMEN AND PELVIS WITHOUT CONTRAST TECHNIQUE: Multidetector CT imaging of the abdomen and pelvis was performed following the standard protocol without IV contrast. RADIATION DOSE REDUCTION: This exam was  performed according to the departmental dose-optimization program which includes automated exposure control, adjustment of the mA and/or kV according to patient size and/or use of iterative reconstruction technique. COMPARISON:  CT abdomen and pelvis 08/02/2020 FINDINGS: Lower chest: Trace bilateral pleural effusions peribronchial wall thickening with mucous plugging and patchy infiltrates in the lower lungs. Marked cardiomegaly. Hepatobiliary: Unremarkable noncontrast appearance of the liver. Unremarkable gallbladder and biliary tree. Pancreas: No acute abnormality. Spleen: Unremarkable. Adrenals/Urinary Tract: Stable adrenal glands. No urinary calculi. No hydronephrosis. Low-density cystic lesions compatible with benign cysts. No follow-up recommended. Mild bladder wall thickening may be due to underdistention or cystitis. Evaluation pyelonephritis is limited by lack of IV contrast. No perinephric stranding. Stomach/Bowel: Normal caliber large and small bowel. Colonic diverticulosis about the sigmoid and descending colon. There is wall thickening, adjacent stranding, and small amount of free fluid about the distal descending and sigmoid colon compatible with diverticulitis. No organized fluid collection. No free air. Normal appendix.  Stomach is within normal limits. Vascular/Lymphatic: Advanced aortic atherosclerotic calcification. No lymphadenopathy. Reproductive: Hysterectomy. Other: No abdominal wall hernia. Musculoskeletal: No acute osseous abnormality. Advanced thoracolumbar spondylosis. Bilateral L5 pars defects with grade 1 spondylolisthesis. IMPRESSION: 1. Acute uncomplicated diverticulitis of the distal descending and sigmoid colon. 2. Mild bladder wall thickening may be due to underdistention or cystitis. Correlate with urinalysis. 3. Trace bilateral pleural effusions with bilateral lower lung infiltrates which may be due to edema given marked cardiomegaly though atypical infection is not excluded. 4.  Peribronchial wall thickening and mucous plugging in the lower lungs. Aortic Atherosclerosis (ICD10-I70.0). Electronically Signed   By: Minerva Fester M.D.   On: 01/15/2023 23:04   DG Chest Port 1 View  Result Date: 01/15/2023 CLINICAL DATA:  Questionable sepsis, evaluate for abnormality. EXAM: PORTABLE CHEST 1 VIEW COMPARISON:  11/01/2022 FINDINGS: Stable cardiomegaly. Aortic atherosclerotic calcification. Chronic bronchitic changes and interstitial coarsening. Probable small bilateral pleural effusions. No pneumothorax. No definite displaced rib fractures. Advanced arthritis both shoulders. IMPRESSION: 1. Cardiomegaly and interstitial coarsening suggestive of edema. Chronic bronchitic lung changes or viral infection could appear similarly. 2. Small bilateral pleural effusions. Electronically Signed   By: Minerva Fester M.D.   On: 01/15/2023 19:25        Scheduled Meds:  ascorbic acid  250 mg Oral BID   feeding supplement  237 mL Oral BID BM   gabapentin  300  mg Oral BID   leptospermum manuka honey  1 Application Topical Daily   levothyroxine  100 mcg Oral q morning   multivitamin with minerals  1 tablet Oral Daily   pantoprazole  40 mg Oral Daily   warfarin  1.5 mg Oral ONCE-1600   Warfarin - Pharmacist Dosing Inpatient   Does not apply q1600   zinc sulfate  220 mg Oral Daily   Continuous Infusions:  cefTRIAXone (ROCEPHIN)  IV 2 g (01/16/23 1002)   lactated ringers Stopped (01/16/23 0058)   lactated ringers 75 mL/hr at 01/16/23 0130   metronidazole 500 mg (01/16/23 1220)     LOS: 0 days    Time spent: 35 minutes    Dorcas Carrow, MD [facility] Pager [phone]

## 2023-01-16 NOTE — Progress Notes (Signed)
Pt hasn't urinated for more than 6 hours. Pt has attempted to void on [facility] but was unsuccessful. Bladder scanned pt 3x and read 190-230 ml. NP Virgel Manifold made aware. Will continue to monitor pt.

## 2023-01-16 NOTE — Assessment & Plan Note (Signed)
Patient recently been receiving ceftriaxone.  Will switch to Cipro Flagyl given associated diverticulitis await results of urine culture adjust antibiotics as needed

## 2023-01-16 NOTE — Progress Notes (Signed)
ANTICOAGULATION CONSULT NOTE - Initial Consult  Pharmacy Consult for Warfarin Indication: atrial fibrillation, H/O PE  Allergies  Allergen Reactions   Amoxicillin Other (See Comments)    Unknown reaction    Azithromycin Other (See Comments)    Unknown reaction    Codeine Other (See Comments)    Unknown reaction    Erythromycin Other (See Comments)    Unknown reaction    Green Dyes Other (See Comments)    Allergic to ALL dyes   Iodine Other (See Comments)    Unknown reaction    Misc. Sulfonamide Containing Compounds    Oxycodone Other (See Comments)    Unknown reaction      Oxycodone-Acetaminophen Other (See Comments)    Unknown reaction    Penicillins Other (See Comments)    Unknown reaction    Sulfasalazine    Tramadol Hives    Patient Measurements: Height: 5\' 5"  (165.1 cm) Weight: 72.3 kg (159 lb 6.3 oz) IBW/kg (Calculated) : 57   Vital Signs: Temp: 97.9 F (36.6 C) (05/10 0737) Temp Source: Oral (05/10 0737) BP: 91/57 (05/10 0737) Pulse Rate: 63 (05/10 0737)  Labs: Recent Labs    01/15/23 1842 01/16/23 0526  HGB 10.4* 8.9*  HCT 33.4* 28.2*  PLT 189 144*  APTT 40*  --   LABPROT 22.6* 24.1*  INR 2.0* 2.1*  CREATININE 1.16* 1.25*  CKTOTAL 34*  --      Estimated Creatinine Clearance: 28.6 mL/min (A) (by C-G formula based on SCr of 1.25 mg/dL (H)). Medications:  PTA Warfarin 2mg  M-F and 1mg  Sat and Sun - LD dose taken 5/8 @ 2200  Assessment: 89 yr female admitted with sepsis/UTI. Patient recently completed course of cefdinir for UTI and presents to ED with fever. PMH significant for COPD, PE, AFib, CKD, PAD, CHF. Pharmacy consulted to continue warfarin while admitted.  Today, 01/16/2023: INR remains therapeutic on PTA dosing CBC: Hgb & Plt both lower today; neither Clear liquid diet ordered; intake not yet charted Drug interactions with warfarin:  Flagyl started for intra-abdominal coverage; can increase INR via reduced warfarin  clearance Received Levaquin x 1 in ED; switching to Rocephin today, but both can increase warfarin sensitivity  Goal of Therapy:  INR 2-3 Monitor platelets by anticoagulation protocol: Yes   Plan:  Warfarin 1.5 mg today - anticipate bump in INR d/t multiple drug interactions & acute illness, but with INR borderline low on admission, want to avoid undershooting as well Daily PT/INR; CBC at least q72 hr Monitor for s/s bleeding or thrombosis  Trooper Olander A, PharmD 01/16/2023,8:27 AM

## 2023-01-16 NOTE — Assessment & Plan Note (Signed)
Wound care consult ordered.

## 2023-01-16 NOTE — TOC Initial Note (Addendum)
Transition of Care [facility]) - Initial/Assessment Note    Patient Details  Name: [name] MRN: [id number] Date of Birth: [birth year]  Transition of Care [facility]) CM/SW Contact:    Howell Rucks, RN Phone Number: 01/16/2023, 1:25 PM  Clinical Narrative:  Met with pt in room to introduce role of TOC/NCM an review dc plans, PT recommendation for short term rehab, pt gave verbal ok to speak with her daughter for dc plans. NCM call to pt's dtr [name]), voicemail left requesting call back.  FL2 completed. Will continue to follow -4:12pm Call to pt's dtr [name]), introduced role of TOC/NCM and review dc needs, informed of PT recommendation for short term rehab.Dtr reports pt currently has  HH skilled nursing and  HH PT through [facility],  home DME- walker, wheelchair, chair lyft x 2. Dtr states she would like to speak with pt/family regarding decision for short term rehab, reports pt  recently in short term rehab, seemed to have improved functional mobility once discharged home. Await family decision. Will continue to follow.                  Expected Discharge Plan: Skilled Nursing Facility Barriers to Discharge: Continued Medical Work up   Patient Goals and CMS Choice             Expected Discharge Plan and Services   Discharge Planning Services: CM Consult   Living arrangements for the past 2 months: Single Family Home                                      Prior Living Arrangements/Services Living arrangements for the past 2 months: Single Family Home Lives with:: Adult Children Patient language and need for interpreter reviewed:: Yes        Need for Family Participation in Patient Care: Yes (Comment) Care giver support system in place?: Yes (comment)   Criminal Activity/Legal Involvement Pertinent to Current Situation/Hospitalization: No - Comment as needed  Activities of Daily Living Home Assistive Devices/Equipment: Dentures (specify type), Eyeglasses, Oxygen,  Walker (specify type), Wheelchair ADL Screening (condition at time of admission) Patient's cognitive ability adequate to safely complete daily activities?: Yes Is the patient deaf or have difficulty hearing?: Yes Does the patient have difficulty seeing, even when wearing glasses/contacts?: Yes Does the patient have difficulty concentrating, remembering, or making decisions?: Yes Patient able to express need for assistance with ADLs?: Yes Does the patient have difficulty dressing or bathing?: Yes Independently performs ADLs?: No Communication: Independent Dressing (OT): Needs assistance Is this a change from baseline?: Pre-admission baseline Grooming: Needs assistance Is this a change from baseline?: Pre-admission baseline Feeding: Independent Bathing: Needs assistance Is this a change from baseline?: Pre-admission baseline Toileting: Needs assistance Is this a change from baseline?: Pre-admission baseline In/Out Bed: Independent Walks in Home: Needs assistance Is this a change from baseline?: Pre-admission baseline Does the patient have difficulty walking or climbing stairs?: Yes Weakness of Legs: Both Weakness of Arms/Hands: Both  Permission Sought/Granted Permission sought to share information with : Case Manager Permission granted to share information with : Yes, Verbal Permission Granted  Share Information with NAME: Fannie Knee, RN           Emotional Assessment Appearance:: Appears stated age Attitude/Demeanor/Rapport: Gracious Affect (typically observed): Accepting Orientation: : Oriented to Self, Oriented to Place Alcohol / Substance Use: Not Applicable Psych Involvement: No (comment)  Admission diagnosis:  Acute cystitis without hematuria [N30.00] Sepsis (HCC) [A41.9] Sepsis without acute organ dysfunction, due to unspecified organism [facility]) [A41.9] UTI (urinary tract infection) [N39.0] Patient Active Problem List   Diagnosis Date Noted   Diverticulitis large  intestine w/o perforation or abscess w/o bleeding 01/16/2023   Pressure injury of skin 01/16/2023   UTI (urinary tract infection) 01/16/2023   Sepsis (HCC) 01/15/2023   Hypomagnesemia 01/15/2023   Leg wound, left 01/15/2023   Severe tricuspid valve regurgitation 11/07/2022   Severe aortic stenosis 10/26/2022   Pneumonia due to COVID-19 virus 10/24/2022   COVID-19 10/23/2022   Hypoxia 10/23/2022   Positive blood culture 11/20/2021   Generalized weakness 11/20/2021   Thrombocytopenia (HCC) 11/17/2021   Anemia 11/17/2021   AKI on CKD-3B 11/17/2021   Osteomyelitis (HCC) 11/16/2021   Chronic diastolic CHF (congestive heart failure) (HCC) 11/16/2021   Hyponatremia 11/16/2021   Chronic kidney disease, stage 3b (HCC) 11/16/2021   Leukocytosis 11/16/2021   Uncontrolled hypertension 11/15/2021   Hyperthyroidism 11/15/2021   Peripheral venous insufficiency 11/15/2021   Personal history of pulmonary embolism 11/15/2021   Osteomyelitis of second toe of right foot (HCC) 11/15/2021   Cellulitis 11/15/2021   Chronic thromboembolic pulmonary hypertension (HCC) 05/12/2014   Permanent atrial fibrillation (HCC) 06/20/2009   Chronic obstructive lung disease (HCC) 04/04/2009   Gastroesophageal reflux disease 12/25/2008   Generalized anxiety disorder 12/25/2008   Hypercholesterolemia 12/25/2008   Hypothyroidism 12/25/2008   PCP:  Camie Patience, FNP Pharmacy:   [facility] [phone] Gin[place] Otto, [place] - [address] [place] [address] Phone: [phone] Fax: [phone]     Social Determinants of Health (SDOH) Social History: SDOH Screenings   Food Insecurity: Food Insecurity Present (01/15/2023)  Housing: Low Risk  (01/15/2023)  Transportation Needs: No Transportation Needs (01/15/2023)  Utilities: Not At Risk (01/15/2023)  Tobacco Use: Medium Risk (01/16/2023)   SDOH Interventions:     Readmission Risk Interventions    11/18/2021   12:55 PM  Readmission  Risk Prevention Plan  Transportation Screening Complete  PCP or Specialist Appt within 3-5 Days Complete  HRI or Home Care Consult Complete  Social Work Consult for Recovery Care Planning/Counseling Complete  Palliative Care Screening Complete  Medication Review [employer]) Complete

## 2023-01-16 NOTE — Assessment & Plan Note (Signed)
-   no Evidence of perforation - Bowel rest clear liquid/   - Will rehydrate  - Continue IV antibiotics                 started on  cipro flagyl   01/16/23

## 2023-01-16 NOTE — Evaluation (Addendum)
Occupational Therapy Evaluation Patient Details Name: [name] MRN: [id number] DOB: 1928-02-13 Today's Date: 01/16/2023   History of Present Illness 89 y.o. female with medical history significant of COPD, PE/ A.fib on coumadin, CKD 3b, PAD, chronic leg wounds, diastolic CHF, hypothyroidism Severe aortic stenosis. Pt had recent admission for Covid 2/15-11/07/22, DCed to SNF, home since March. Admitted for   Acute cystitis without hematuria, Sepsis   Clinical Impression   Pt presents with the below deficits, compromising her ADL performance and overall functional independence. She reports requiring approximately min assist from her daughter for self-care tasks at her baseline; she was a questionable historian at times. At current, she is presenting below her baseline level of functioning for ADL management, therefore she will benefit from further OT services to maximize her safety and independence with self-care. She does require cues and education for general safety during progressive activity. If her family is unable to manage her care and current assistance levels, then she may need a higher frequency of post-acute therapy services.      Recommendations for follow up therapy are one component of a multi-disciplinary discharge planning process, led by the attending physician.  Recommendations may be updated based on patient status, additional functional criteria and insurance authorization.   Assistance Recommended at Discharge Frequent or constant Supervision/Assistance  Patient can return home with the following Direct supervision/assist for financial management;Assist for transportation;Direct supervision/assist for medications management;Assistance with cooking/housework;A lot of help with bathing/dressing/bathroom    Functional Status Assessment  Patient has had a recent decline in their functional status and demonstrates the ability to make significant improvements in function in a  reasonable and predictable amount of time.  Equipment Recommendations  None recommended by OT       Precautions / Restrictions Precautions Precautions: Fall Restrictions Weight Bearing Restrictions: No      Mobility Bed Mobility Overal bed mobility: Needs Assistance Bed Mobility: Supine to Sit     Supine to sit: Min guard, HOB elevatedTransfers Overall transfer level: Needs assistance Equipment used: Rolling walker (2 wheels) Transfers: Sit to/from Stand, Bed to chair/wheelchair/BSC Sit to Stand: Min assist, +2 safety/equipment     Step pivot transfers: Min assist, +2 safety/equipment     General transfer comment: Required cues for hand placment, though pt opted to place her upper extremity at her desired position on the walker instead      Balance       Sitting balance - Comments: static sitting-good. dynamic sitting-fair+     Standing balance-Leahy Scale: Poor         ADL either performed or assessed with clinical judgement   ADL Overall ADL's : Needs assistance/impaired Eating/Feeding: Sitting;Set up   Grooming: Minimal assistance;Sitting Grooming Details (indicate cue type and reason): based on clinical judgement, at chair level         Upper Body Dressing : Minimal assistance;Sitting Upper Body Dressing Details (indicate cue type and reason): based on clinical judgement Lower Body Dressing: Maximal assistance Lower Body Dressing Details (indicate cue type and reason): She required total assist to donn her socks seated EOB. Pt reports her daughter helps her with this at her baseline. Toilet Transfer: Moderate assistance;Rolling walker (2 wheels);BSC/3in1;Cueing for safety   Toileting- Clothing Manipulation and Hygiene: Maximal assistance;Sit to/from stand;Cueing for safety Toileting - Clothing Manipulation Details (indicate cue type and reason): She required increased assist for peri-hygiene and clothing management in standing, as well as steadying  assist & use of RW for added stability in standing.  Vision   Additional Comments: She wears corrective lenses.            Pertinent Vitals/Pain Pain Assessment Pain Assessment:  (She denied having pain at rest, however presented with withdrawal response and grimacing when her L heel was touched.)     Hand Dominance Right   Extremity/Trunk Assessment Upper Extremity Assessment Upper Extremity Assessment:  RUE Deficits / Details: Chronic appearing shoulder AROM limitations with AROM for shoulder flexion <90 degrees. Elbow and hand AROM WFL. Functional grip strength LUE Deficits / Details: Chronic appearing shoulder AROM limitations with AROM for shoulder flexion <90 degrees. Elbow and hand AROM WFL. Functional grip strength   Lower Extremity Assessment Lower Extremity Assessment: Overall WFL for tasks assessed      Communication Communication Communication: No difficulties   Cognition Arousal/Alertness: Awake/alert Behavior During Therapy: WFL for tasks assessed/performed Overall Cognitive Status: No family/caregiver present to determine baseline cognitive functioning Area of Impairment: Memory, Safety/judgement, Awareness          Safety/Judgement: Decreased awareness of safety                      Home Living Family/patient expects to be discharged to:: Private residence Living Arrangements: Children Available Help at Discharge: Family Type of Home: House Home Access: Level entry     Home Layout: One level     Bathroom Shower/Tub: Sponge bathes at baseline   [employer]: Standard     Home Equipment: Agricultural consultant (2 wheels);BSC/3in1          Prior Functioning/Environment Prior Level of Function : Needs assist             Mobility Comments: Per chart, pt uses RW for mobility ADLs Comments: Per prior admission info, pt min assist for ADLs, sponge bathes at baseline with help from daughter. Her daughter manages household  chores. Pt poor historian so unable to provide accurate info        OT Problem List: Decreased strength;Decreased range of motion;Decreased activity tolerance;Impaired balance (sitting and/or standing);Decreased cognition;Decreased safety awareness;Decreased knowledge of use of DME or AE;Pain      OT Treatment/Interventions: Self-care/ADL training;Therapeutic exercise;Balance training;Energy conservation;Therapeutic activities;DME and/or AE instruction;Cognitive remediation/compensation    OT Goals(Current goals can be found in the care plan section) Acute Rehab OT Goals Patient Stated Goal: To return home OT Goal Formulation: With patient Time For Goal Achievement: 01/30/23 Potential to Achieve Goals: Good ADL Goals Pt Will Perform Grooming: with set-up;sitting Pt Will Perform Upper Body Dressing: with set-up;sitting Pt Will Transfer to Toilet: with supervision;ambulating Pt Will Perform Toileting - Clothing Manipulation and hygiene: sit to/from stand;with supervision  OT Frequency: Min 1X/week       AM-PAC OT "6 Clicks" Daily Activity     Outcome Measure Help from another person eating meals?: None Help from another person taking care of personal grooming?: A Little Help from another person toileting, which includes using toliet, bedpan, or urinal?: A Lot Help from another person bathing (including washing, rinsing, drying)?: A Lot Help from another person to put on and taking off regular upper body clothing?: A Little Help from another person to put on and taking off regular lower body clothing?: A Lot 6 Click Score: 16   End of Session Equipment Utilized During Treatment: Rolling walker (2 wheels);Gait belt;Oxygen Nurse Communication: Other (comment);Mobility status (Blood pressure and heart with pt in chair at the end of the session)  Activity Tolerance: Other (comment) (Fair tolerance) Patient left: in chair;with  call bell/phone within reach;with chair alarm set  OT Visit  Diagnosis: Unsteadiness on feet (R26.81);Muscle weakness (generalized) (M62.81)                Time: 5409-8119 OT Time Calculation (min): 36 min Charges:  OT General Charges $OT Visit: 1 Visit OT Evaluation $OT Eval Moderate Complexity: 1 Mod OT Treatments $Self Care/Home Management : 8-22 mins    Reuben Likes, OTR/L 01/16/2023, 11:24 AM

## 2023-01-16 NOTE — Consult Note (Signed)
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, left heel and sacrum with DTPI  Wound type:Pressure Pressure Injury POA: Yes Measurement: Left heel measures 1cm x 1cm, bedside RN to measure and place measurements for left heel and sacral DTPI on flowsheets today with application of next dressing Wound bed:red granulation tissue and yellow (nonviable tissue) Drainage (amount, consistency, odor) small Periwound:intact Dressing procedure/placement/frequency: I have provide wound care guidance for Nursing via the Orders using Medihoney to the right heel wound daily after cleansing and a silicone foam with xeroform as a wound contact layer to the left heel and sacral DTPI. Turning and repositioning is in place. Bilateral pressure redistribution heel boots are provided for offloading of heels.  WOC nursing team will not follow, but will remain available to this patient, the nursing and medical teams.  Please re-consult if needed.  Thank you for inviting [place] to participate in this patient's Plan of Care.  Ladona Mow, MSN, RN, CNS, GNP, Leda Min, [employer], [employer] phone:  [phone]

## 2023-01-16 NOTE — Assessment & Plan Note (Signed)
-   currently appears to be slightly on the dry side, hold home diuretics for tonight and restart when appears euvolemic, carefuly follow fluid status and Cr  

## 2023-01-16 NOTE — Progress Notes (Signed)
Initial Nutrition Assessment  INTERVENTION:   -Ensure Plus High Protein po BID, each supplement provides 350 kcal and 20 grams of protein.   To aid wound healing: -Multivitamin with minerals daily -250 mg Vitamin C BID -220 mg Zinc sulfate daily x 14 days  NUTRITION DIAGNOSIS:   Increased nutrient needs related to wound healing as evidenced by estimated needs.  GOAL:   Patient will meet greater than or equal to 90% of their needs  MONITOR:   PO intake, Supplement acceptance, Labs, Weight trends, I & O's, Skin  REASON FOR ASSESSMENT:   Consult Assessment of nutrition requirement/status  ASSESSMENT:   89 y.o. female with medical history significant of COPD, PE/ A.fib on coumadin, CKD 3b, PAD, chronic leg wounds, diastolic CHF, hypothyroidism Severe aortic stenosis. Admitted for sepsis.  Unable to see patient given provider in room and therapies. Per chart review, pt with wounds, stage 3 on left heel.  Currently consuming 100% of meals. Will order supplements and vitamins to aid in wound healing.   Per weight records, pt has lost 17 lbs since 3/1 (9% wt loss x 2 months, significant for time frame).  Medications: IV Mg sulfate, Miralax  Labs reviewed: Low Na Low iron   NUTRITION - FOCUSED PHYSICAL EXAM:  Unable to complete at this time.  Diet Order:   Diet Order             Diet Heart Room service appropriate? Yes; Fluid consistency: Thin  Diet effective now                   EDUCATION NEEDS:   No education needs have been identified at this time  Skin:  Skin Assessment: Skin Integrity Issues: Skin Integrity Issues:: Stage III, DTI DTI: right heel, sacrum Stage III: left heel  Last BM:  5/8  Height:   Ht Readings from Last 1 Encounters:  01/15/23 5\' 5"  (1.651 m)    Weight:   Wt Readings from Last 1 Encounters:  01/15/23 72.3 kg    BMI:  Body mass index is 26.52 kg/m.  Estimated Nutritional Needs:   Kcal:  1700-1900  Protein:   75-85g  Fluid:  1.9L/day    Tilda Franco, MS, RD, LDN Inpatient Clinical Dietitian Contact information available via Amion

## 2023-01-17 DIAGNOSIS — K5732 Diverticulitis of large intestine without perforation or abscess without bleeding: Secondary | ICD-10-CM | POA: Diagnosis not present

## 2023-01-17 DIAGNOSIS — N3000 Acute cystitis without hematuria: Secondary | ICD-10-CM | POA: Diagnosis not present

## 2023-01-17 LAB — CBC
HCT: 28.9 % — ABNORMAL LOW (ref 36.0–46.0)
Hemoglobin: 9 g/dL — ABNORMAL LOW (ref 12.0–15.0)
MCH: 29.8 pg (ref 26.0–34.0)
MCHC: 31.1 g/dL (ref 30.0–36.0)
MCV: 95.7 fL (ref 80.0–100.0)
Platelets: 143 10*3/uL — ABNORMAL LOW (ref 150–400)
RBC: 3.02 MIL/uL — ABNORMAL LOW (ref 3.87–5.11)
RDW: 17.2 % — ABNORMAL HIGH (ref 11.5–15.5)
WBC: 7.9 10*3/uL (ref 4.0–10.5)
nRBC: 0 % (ref 0.0–0.2)

## 2023-01-17 LAB — BASIC METABOLIC PANEL
Anion gap: 8 (ref 5–15)
BUN: 15 mg/dL (ref 8–23)
CO2: 28 mmol/L (ref 22–32)
Calcium: 8.9 mg/dL (ref 8.9–10.3)
Chloride: 94 mmol/L — ABNORMAL LOW (ref 98–111)
Creatinine, Ser: 1.09 mg/dL — ABNORMAL HIGH (ref 0.44–1.00)
GFR, Estimated: 48 mL/min — ABNORMAL LOW (ref >60)
Glucose, Bld: 94 mg/dL (ref 70–99)
Potassium: 3.7 mmol/L (ref 3.5–5.1)
Sodium: 130 mmol/L — ABNORMAL LOW (ref 135–145)

## 2023-01-17 LAB — URINE CULTURE: Culture: >=100000 — AB

## 2023-01-17 LAB — PROTIME-INR
INR: 2.3 — ABNORMAL HIGH (ref 0.8–1.2)
Prothrombin Time: 25.9 seconds — ABNORMAL HIGH (ref 11.4–15.2)

## 2023-01-17 MED ORDER — SODIUM CHLORIDE 0.9 % IV SOLN
2.0000 g | INTRAVENOUS | Status: DC
  Administered 2023-01-17 – 2023-01-18 (×2): 2 g via INTRAVENOUS
  Filled 2023-01-17 (×3): qty 12.5

## 2023-01-17 MED ORDER — METRONIDAZOLE 500 MG PO TABS
500.0000 mg | ORAL_TABLET | Freq: Two times a day (BID) | ORAL | Status: DC
  Administered 2023-01-17 – 2023-01-21 (×9): 500 mg via ORAL
  Filled 2023-01-17 (×9): qty 1

## 2023-01-17 MED ORDER — WARFARIN SODIUM 1 MG PO TABS
1.0000 mg | ORAL_TABLET | Freq: Once | ORAL | Status: AC
  Administered 2023-01-17: 1 mg via ORAL
  Filled 2023-01-17: qty 1

## 2023-01-17 NOTE — Progress Notes (Signed)
Pharmacy Antibiotic Note  [name] is a 89 y.o. female admitted on 01/15/2023 with UTI.  Pharmacy has been consulted for Cefepime dosing.  ID: sepsis: UTI vs colitis - Afebrile, WBC WNL, Scr 1  Antimicrobials this admission: 5/9 Levaquin >>  5/10  5/10 Rocephin >> 5/11 5/10 flagyl >> 5/11 Cefepime>>  Microbiology results: 5/9 BCx: NGTD 5/9 UCx: PSA Plan: D/c Rocephin Start Cefepime 2g IV q24h. Pharmacy will sign off. Please reconsult for further dosing assitance.   Height: 5\' 5"  (165.1 cm) Weight: 72.3 kg (159 lb 6.3 oz) IBW/kg (Calculated) : 57  Temp (24hrs), Avg:98 F (36.7 C), Min:97.9 F (36.6 C), Max:98 F (36.7 C)  Recent Labs  Lab 01/15/23 1842 01/16/23 0526 01/17/23 0505  WBC 15.2* 10.4 7.9  CREATININE 1.16* 1.25* 1.09*  LATICACIDVEN 1.0  --   --     Estimated Creatinine Clearance: 32.8 mL/min (A) (by C-G formula based on SCr of 1.09 mg/dL (H)).    Allergies  Allergen Reactions   Amoxicillin Other (See Comments)    Unknown reaction  Tolerates Keflex, Cefepime   Azithromycin Other (See Comments)    Unknown reaction    Codeine Other (See Comments)    Unknown reaction    Erythromycin Other (See Comments)    Unknown reaction    Green Dyes Other (See Comments)    Allergic to ALL dyes   Iodine Other (See Comments)    Unknown reaction    Misc. Sulfonamide Containing Compounds    Oxycodone Other (See Comments)    Unknown reaction      Oxycodone-Acetaminophen Other (See Comments)    Unknown reaction    Penicillins Other (See Comments)    Unknown reaction  Tolerates Keflex, Cefepime   Sulfasalazine    Tramadol Hives    Emory Leaver S. Merilynn Finland, PharmD, BCPS Clinical Staff Pharmacist [email or web address]  Pasty Spillers 01/17/2023 12:05 PM

## 2023-01-17 NOTE — Progress Notes (Signed)
PROGRESS NOTE    KRISHIKA ZARAZUA  ZOX:[id number] DOB: [birth year] DOA: 01/15/2023 PCP: Camie Patience, FNP    Brief Narrative:  89 year old female with chronic multiple medical issues, history significant for COPD and chronic hypoxemia on 2 L oxygen at home, paroxysmal atrial fibrillation on Coumadin, pulmonary embolism, hypothyroidism, severe aortic stenosis who presents from home with fever.  Reportedly she had temperature at home, Tylenol was given she did not respond so brought to the ER.  She recently finished cefdinir for UTI.  Patient was recently admitted to the hospital and went to a SNF and subsequently going home with her daughter.  Urinalysis was abnormal.  CT scan abdomen pelvis also showed uncomplicated left-sided colitis.   Assessment & Plan:   Acute UTI without hematuria, sepsis present on admission with presence of temperature 101, blood pressure 85/50, respiratory 33 and WBC count of 15.2. 2 possible source of infection.  UTI and left-sided colitis. Blood cultures negative.  Urine culture growing Pseudomonas. On Rocephin and Flagyl. Changing  to cefepime, change to oral Flagyl today.  Left-sided diverticulitis: Minimal symptoms.  Tolerating regular diet.  Will treat with 7 days of antibiotics.  Chronic medical issues including Hypothyroidism, on Synthroid.  Continued. CKD stage IIIb, at about her baseline.  Monitor on treatment. COPD and chronic hypoxemia, chronic and stable. Multiple ulcers, not infected.  Continue wound care. Permanent atrial fibrillation, on Coumadin therapy.  Stable. Hypomagnesemia, replaced and improved.   Pressure Injury 01/16/23 Heel Left Stage 3 -  Full thickness tissue loss. Subcutaneous fat may be visible but bone, tendon or muscle are NOT exposed. (Active)  01/16/23 0022  Location: Heel  Location Orientation: Left  Staging: Stage 3 -  Full thickness tissue loss. Subcutaneous fat may be visible but bone, tendon or muscle are NOT exposed.   Wound Description (Comments):   Present on Admission: Yes     Pressure Injury 01/16/23 Heel Right Deep Tissue Pressure Injury - Purple or maroon localized area of discolored intact skin or blood-filled blister due to damage of underlying soft tissue from pressure and/or shear. (Active)  01/16/23 0023  Location: Heel  Location Orientation: Right  Staging: Deep Tissue Pressure Injury - Purple or maroon localized area of discolored intact skin or blood-filled blister due to damage of underlying soft tissue from pressure and/or shear.  Wound Description (Comments):   Present on Admission:      Pressure Injury 01/16/23 Sacrum Deep Tissue Pressure Injury - Purple or maroon localized area of discolored intact skin or blood-filled blister due to damage of underlying soft tissue from pressure and/or shear. (Active)  01/16/23 0025  Location: Sacrum  Location Orientation:   Staging: Deep Tissue Pressure Injury - Purple or maroon localized area of discolored intact skin or blood-filled blister due to damage of underlying soft tissue from pressure and/or shear.  Wound Description (Comments):   Present on Admission: Yes       DVT prophylaxis:    Code Status: DNR Family Communication: No family at the bedside.  Unable to talk to the daughter. Disposition Plan: Status is: Inpatient Remains inpatient appropriate because: IV antibiotics, needs SNF.     Consultants:  None  Procedures:  None  Antimicrobials:  Levaquin and Flagyl 5/9--- 5/10 Rocephin and Flagyl 5/10--- Cefepime and Flagyl 5/11---   Subjective:  Patient seen and examined.  Denies any complaints.  Denies any nausea vomiting or abdominal pain.  She finished her breakfast.  Objective: Vitals:   01/16/23 0858 01/16/23 1133 01/16/23 2023  01/17/23 0451  BP: (!) 117/55 110/68 (!) 155/67 (!) 138/55  Pulse: 68 (!) 55 68 71  Resp:  (!) 24 18 20   Temp: 98 F (36.7 C) 97.9 F (36.6 C) 98 F (36.7 C) 97.9 F (36.6 C)   TempSrc:  Oral Oral Oral  SpO2: 97% 100% 96% 97%  Weight:      Height:        Intake/Output Summary (Last 24 hours) at 01/17/2023 1158 Last data filed at 01/16/2023 1500 Gross per 24 hour  Intake 1055 ml  Output 200 ml  Net 855 ml    Filed Weights   01/15/23 1826  Weight: 72.3 kg    Examination:  General exam: Chronically sick looking.  Frail and debilitated.  Not in any distress. Looks fairly comfortable laying in bed, currently on 2 L of oxygen. Respiratory system: No added sounds. Cardiovascular system: S1 & S2 heard, RRR. No pedal edema. Gastrointestinal system: Abdomen is nondistended, soft and nontender. No organomegaly or masses felt. Normal bowel sounds heard. Central nervous system: Alert and oriented. No focal neurological deficits.  Gross generalized weakness. Extremities:  Multiple pressure ulcers along with large ulcer on the left heel as documented above.    Data Reviewed: I have personally reviewed following labs and imaging studies  CBC: Recent Labs  Lab 01/15/23 1842 01/16/23 0526 01/17/23 0505  WBC 15.2* 10.4 7.9  NEUTROABS 13.0* 8.2*  --   HGB 10.4* 8.9* 9.0*  HCT 33.4* 28.2* 28.9*  MCV 96.0 96.2 95.7  PLT 189 144* 143*    Basic Metabolic Panel: Recent Labs  Lab 01/15/23 1842 01/16/23 0526 01/17/23 0505  NA 133* 131* 130*  K 3.5 3.5 3.7  CL 95* 95* 94*  CO2 28 28 28   GLUCOSE 98 92 94  BUN 15 16 15   CREATININE 1.16* 1.25* 1.09*  CALCIUM 9.1 8.5* 8.9  MG 1.6* 2.1  --   PHOS 2.7 3.0  --     GFR: Estimated Creatinine Clearance: 32.8 mL/min (A) (by C-G formula based on SCr of 1.09 mg/dL (H)). Liver Function Tests: Recent Labs  Lab 01/15/23 1842 01/16/23 0526  AST 30 43*  ALT 14 19  ALKPHOS 131* 107  BILITOT 1.4* 1.3*  PROT 6.4* 5.3*  ALBUMIN 2.8* 2.3*    No results for input(s): "LIPASE", "AMYLASE" in the last 168 hours. No results for input(s): "AMMONIA" in the last 168 hours. Coagulation Profile: Recent Labs  Lab  01/15/23 1842 01/16/23 0526 01/17/23 0505  INR 2.0* 2.1* 2.3*    Cardiac Enzymes: Recent Labs  Lab 01/15/23 1842  CKTOTAL 34*    BNP (last 3 results) No results for input(s): "PROBNP" in the last 8760 hours. HbA1C: No results for input(s): "HGBA1C" in the last 72 hours. CBG: No results for input(s): "GLUCAP" in the last 168 hours. Lipid Profile: No results for input(s): "CHOL", "HDL", "LDLCALC", "TRIG", "CHOLHDL", "LDLDIRECT" in the last 72 hours. Thyroid Function Tests: Recent Labs    01/15/23 1842  TSH 0.997    Anemia Panel: Recent Labs    01/15/23 1842  VITAMINB12 784  FOLATE 9.5  FERRITIN 162  TIBC 204*  IRON 14*  RETICCTPCT 1.1    Sepsis Labs: Recent Labs  Lab 01/15/23 1842  PROCALCITON 0.10  LATICACIDVEN 1.0     Recent Results (from the past 240 hour(s))  Urine Culture     Status: Abnormal (Preliminary result)   Collection Time: 01/15/23  6:32 PM   Specimen: Urine, Random  Result Value  Ref Range Status   Specimen Description   Final    URINE, RANDOM Performed at [facility], [address] [place], [place] [address]    Special Requests   Final    NONE Reflexed from [id number] Performed at [facility], [address] [place], [place] [address]    Culture (A)  Final    >=100,000 COLONIES/mL PSEUDOMONAS AERUGINOSA SUSCEPTIBILITIES TO FOLLOW Performed at [facility], [address] [place], [place] [address]    Report Status PENDING  Incomplete  Blood Culture (routine x 2)     Status: None (Preliminary result)   Collection Time: 01/15/23  6:41 PM   Specimen: BLOOD RIGHT ARM  Result Value Ref Range Status   Specimen Description   Final    BLOOD RIGHT ARM Performed at [facility], [address] [place], [place] [address]    Special Requests   Final    BOTTLES DRAWN AEROBIC AND ANAEROBIC Blood Culture adequate volume Performed at [facility], [address]  [place], [place] [address]    Culture   Final    NO GROWTH < 12 HOURS Performed at [facility], [address] [place], [place] [address]    Report Status PENDING  Incomplete  Blood Culture (routine x 2)     Status: None (Preliminary result)   Collection Time: 01/15/23  6:43 PM   Specimen: BLOOD LEFT ARM  Result Value Ref Range Status   Specimen Description   Final    BLOOD LEFT ARM Performed at [facility], [address] [place], [place] [address]    Special Requests   Final    BOTTLES DRAWN AEROBIC AND ANAEROBIC Blood Culture adequate volume Performed at [facility], [address] [place], [place] [address]    Culture   Final    NO GROWTH < 12 HOURS Performed at [facility], [address], [place], [place] [address]    Report Status PENDING  Incomplete  Resp panel by RT-PCR (RSV, Flu A&B, Covid) Anterior Nasal Swab     Status: None   Collection Time: 01/15/23  7:02 PM   Specimen: Anterior Nasal Swab  Result Value Ref Range Status   SARS Coronavirus 2 by RT PCR NEGATIVE NEGATIVE Final    Comment: (NOTE) SARS-CoV-2 target nucleic acids are NOT DETECTED.  The SARS-CoV-2 RNA is generally detectable in upper respiratory specimens during the acute phase of infection. The lowest concentration of SARS-CoV-2 viral copies this assay can detect is 138 copies/mL. A negative result does not preclude SARS-Cov-2 infection and should not be used as the sole basis for treatment or other patient management decisions. A negative result may occur with  improper specimen collection/handling, submission of specimen other than nasopharyngeal swab, presence of viral mutation(s) within the areas targeted by this assay, and inadequate number of viral copies(<138 copies/mL). A negative result must be combined with clinical observations, patient history, and epidemiological information. The expected result is Negative.  Fact Sheet for Patients:   BloggerCourse.com  Fact Sheet for Healthcare Providers:  SeriousBroker.it  This test is no t yet approved or cleared by the [place] FDA and  has been authorized for detection and/or diagnosis of SARS-CoV-2 by FDA under an Emergency Use Authorization (EUA). This EUA will remain  in effect (meaning this test can be used) for the duration of the COVID-19 declaration under Section 564(b)(1) of the Act, 21  U.S.C.section 360bbb-3(b)(1), unless the authorization is terminated  or revoked sooner.       Influenza A by PCR NEGATIVE NEGATIVE Final   Influenza B by PCR NEGATIVE NEGATIVE Final    Comment: (NOTE) The Xpert Xpress SARS-CoV-2/FLU/RSV plus assay is intended as an aid in the diagnosis of influenza from Nasopharyngeal swab specimens and should not be used as a sole basis for treatment. Nasal washings and aspirates are unacceptable for Xpert Xpress SARS-CoV-2/FLU/RSV testing.  Fact Sheet for Patients: BloggerCourse.com  Fact Sheet for Healthcare Providers: SeriousBroker.it  This test is not yet approved or cleared by the [place] FDA and has been authorized for detection and/or diagnosis of SARS-CoV-2 by FDA under an Emergency Use Authorization (EUA). This EUA will remain in effect (meaning this test can be used) for the duration of the COVID-19 declaration under Section 564(b)(1) of the Act, 21 U.S.C. section 360bbb-3(b)(1), unless the authorization is terminated or revoked.     Resp Syncytial Virus by PCR NEGATIVE NEGATIVE Final    Comment: (NOTE) Fact Sheet for Patients: BloggerCourse.com  Fact Sheet for Healthcare Providers: SeriousBroker.it  This test is not yet approved or cleared by the [place] FDA and has been authorized for detection and/or diagnosis of SARS-CoV-2 by FDA under an Emergency Use  Authorization (EUA). This EUA will remain in effect (meaning this test can be used) for the duration of the COVID-19 declaration under Section 564(b)(1) of the Act, 21 U.S.C. section 360bbb-3(b)(1), unless the authorization is terminated or revoked.  Performed at [facility], [address] [place], [place] [address]          Radiology Studies: CT ABDOMEN PELVIS WO CONTRAST  Result Date: 01/15/2023 CLINICAL DATA:  Complicated history with pyelonephritis suspected. EXAM: CT ABDOMEN AND PELVIS WITHOUT CONTRAST TECHNIQUE: Multidetector CT imaging of the abdomen and pelvis was performed following the standard protocol without IV contrast. RADIATION DOSE REDUCTION: This exam was performed according to the departmental dose-optimization program which includes automated exposure control, adjustment of the mA and/or kV according to patient size and/or use of iterative reconstruction technique. COMPARISON:  CT abdomen and pelvis 08/02/2020 FINDINGS: Lower chest: Trace bilateral pleural effusions peribronchial wall thickening with mucous plugging and patchy infiltrates in the lower lungs. Marked cardiomegaly. Hepatobiliary: Unremarkable noncontrast appearance of the liver. Unremarkable gallbladder and biliary tree. Pancreas: No acute abnormality. Spleen: Unremarkable. Adrenals/Urinary Tract: Stable adrenal glands. No urinary calculi. No hydronephrosis. Low-density cystic lesions compatible with benign cysts. No follow-up recommended. Mild bladder wall thickening may be due to underdistention or cystitis. Evaluation pyelonephritis is limited by lack of IV contrast. No perinephric stranding. Stomach/Bowel: Normal caliber large and small bowel. Colonic diverticulosis about the sigmoid and descending colon. There is wall thickening, adjacent stranding, and small amount of free fluid about the distal descending and sigmoid colon compatible with diverticulitis. No organized fluid collection. No  free air. Normal appendix.  Stomach is within normal limits. Vascular/Lymphatic: Advanced aortic atherosclerotic calcification. No lymphadenopathy. Reproductive: Hysterectomy. Other: No abdominal wall hernia. Musculoskeletal: No acute osseous abnormality. Advanced thoracolumbar spondylosis. Bilateral L5 pars defects with grade 1 spondylolisthesis. IMPRESSION: 1. Acute uncomplicated diverticulitis of the distal descending and sigmoid colon. 2. Mild bladder wall thickening may be due to underdistention or cystitis. Correlate with urinalysis. 3. Trace bilateral pleural effusions with bilateral lower lung infiltrates which may be due to edema given marked cardiomegaly though atypical infection is not excluded. 4. Peribronchial wall thickening and mucous plugging in the lower lungs. Aortic Atherosclerosis (ICD10-I70.0). Electronically  Signed   By: Minerva Fester M.D.   On: 01/15/2023 23:04   DG Chest Port 1 View  Result Date: 01/15/2023 CLINICAL DATA:  Questionable sepsis, evaluate for abnormality. EXAM: PORTABLE CHEST 1 VIEW COMPARISON:  11/01/2022 FINDINGS: Stable cardiomegaly. Aortic atherosclerotic calcification. Chronic bronchitic changes and interstitial coarsening. Probable small bilateral pleural effusions. No pneumothorax. No definite displaced rib fractures. Advanced arthritis both shoulders. IMPRESSION: 1. Cardiomegaly and interstitial coarsening suggestive of edema. Chronic bronchitic lung changes or viral infection could appear similarly. 2. Small bilateral pleural effusions. Electronically Signed   By: Minerva Fester M.D.   On: 01/15/2023 19:25        Scheduled Meds:  ascorbic acid  250 mg Oral BID   feeding supplement  237 mL Oral BID BM   gabapentin  300 mg Oral BID   leptospermum manuka honey  1 Application Topical Daily   levothyroxine  100 mcg Oral q morning   metroNIDAZOLE  500 mg Oral Q8H   multivitamin with minerals  1 tablet Oral Daily   pantoprazole  40 mg Oral Daily   Warfarin  - Pharmacist Dosing Inpatient   Does not apply q1600   zinc sulfate  220 mg Oral Daily   Continuous Infusions:     LOS: 1 day    Time spent: 35 minutes    Dorcas Carrow, MD [facility] Pager [phone]

## 2023-01-17 NOTE — Plan of Care (Signed)
  Problem: Coping: Goal: Level of anxiety will decrease Outcome: Not Progressing   

## 2023-01-17 NOTE — Progress Notes (Signed)
ANTICOAGULATION CONSULT NOTE  Pharmacy Consult for Warfarin Indication: atrial fibrillation, H/O PE  Allergies  Allergen Reactions   Amoxicillin Other (See Comments)    Unknown reaction  Tolerates Keflex, Cefepime   Azithromycin Other (See Comments)    Unknown reaction    Codeine Other (See Comments)    Unknown reaction    Erythromycin Other (See Comments)    Unknown reaction    Green Dyes Other (See Comments)    Allergic to ALL dyes   Iodine Other (See Comments)    Unknown reaction    Misc. Sulfonamide Containing Compounds    Oxycodone Other (See Comments)    Unknown reaction      Oxycodone-Acetaminophen Other (See Comments)    Unknown reaction    Penicillins Other (See Comments)    Unknown reaction  Tolerates Keflex, Cefepime   Sulfasalazine    Tramadol Hives    Patient Measurements: Height: 5\' 5"  (165.1 cm) Weight: 72.3 kg (159 lb 6.3 oz) IBW/kg (Calculated) : 57   Vital Signs: Temp: 97.9 F (36.6 C) (05/11 0451) Temp Source: Oral (05/11 0451) BP: 138/55 (05/11 0451) Pulse Rate: 71 (05/11 0451)  Labs: Recent Labs    01/15/23 1842 01/16/23 0526 01/17/23 0505  HGB 10.4* 8.9* 9.0*  HCT 33.4* 28.2* 28.9*  PLT 189 144* 143*  APTT 40*  --   --   LABPROT 22.6* 24.1* 25.9*  INR 2.0* 2.1* 2.3*  CREATININE 1.16* 1.25* 1.09*  CKTOTAL 34*  --   --      Estimated Creatinine Clearance: 32.8 mL/min (A) (by C-G formula based on SCr of 1.09 mg/dL (H)). Medications:  PTA Warfarin 2mg  M-F and 1mg  Sat and Sun - LD dose taken 5/8 @ 2200  Assessment: 89 yr female admitted with sepsis/UTI. Patient recently completed course of cefdinir for UTI and presents to ED with fever. PMH significant for COPD, PE, AFib, CKD, PAD, CHF. Pharmacy consulted to continue warfarin while admitted.  Today, 01/17/2023: INR remains therapeutic with dose empirically reduced yesterday for concomitant Flagyl CBC: Hgb & Plt both low but stable from yesterday (suspect primarily dilutional);  Hgb noted to be low on admission Clear liquid diet ordered; intake not yet charted Drug interactions with warfarin:  Flagyl started for intra-abdominal coverage; can increase INR via reduced warfarin clearance Received Levaquin x 1 in ED; switching to Rocephin today, but both can increase warfarin sensitivity  Goal of Therapy:  INR 2-3 Monitor platelets by anticoagulation protocol: Yes   Plan:  Warfarin 1 mg today (PTA dose) - anticipate bump in INR d/t multiple drug interactions & acute illness, but with INR borderline low on admission, want to avoid undershooting as well Daily PT/INR; CBC at least q72 hr Monitor for s/s bleeding or thrombosis  Yaris Ferrell A, PharmD 01/17/2023,11:38 AM

## 2023-01-18 DIAGNOSIS — K5732 Diverticulitis of large intestine without perforation or abscess without bleeding: Secondary | ICD-10-CM | POA: Diagnosis not present

## 2023-01-18 DIAGNOSIS — N3000 Acute cystitis without hematuria: Secondary | ICD-10-CM | POA: Diagnosis not present

## 2023-01-18 LAB — CULTURE, BLOOD (ROUTINE X 2)
Culture: NO GROWTH
Special Requests: ADEQUATE

## 2023-01-18 LAB — URINE CULTURE

## 2023-01-18 LAB — PROTIME-INR
INR: 2.4 — ABNORMAL HIGH (ref 0.8–1.2)
Prothrombin Time: 26 seconds — ABNORMAL HIGH (ref 11.4–15.2)

## 2023-01-18 MED ORDER — WARFARIN SODIUM 1 MG PO TABS
1.0000 mg | ORAL_TABLET | Freq: Once | ORAL | Status: AC
  Administered 2023-01-18: 1 mg via ORAL
  Filled 2023-01-18: qty 1

## 2023-01-18 NOTE — Progress Notes (Addendum)
ANTICOAGULATION CONSULT NOTE  Pharmacy Consult for Warfarin Indication: atrial fibrillation, H/O PE  Allergies  Allergen Reactions   Amoxicillin Other (See Comments)    Unknown reaction  Tolerates Keflex, Cefepime   Azithromycin Other (See Comments)    Unknown reaction    Codeine Other (See Comments)    Unknown reaction    Erythromycin Other (See Comments)    Unknown reaction    Green Dyes Other (See Comments)    Allergic to ALL dyes   Iodine Other (See Comments)    Unknown reaction    Misc. Sulfonamide Containing Compounds    Oxycodone Other (See Comments)    Unknown reaction      Oxycodone-Acetaminophen Other (See Comments)    Unknown reaction    Penicillins Other (See Comments)    Unknown reaction  Tolerates Keflex, Cefepime   Sulfasalazine    Tramadol Hives    Patient Measurements: Height: 5\' 5"  (165.1 cm) Weight: 72.3 kg (159 lb 6.3 oz) IBW/kg (Calculated) : 57   Vital Signs: Temp: 97.6 F (36.4 C) (05/12 0421) Temp Source: Oral (05/12 0421) BP: 153/71 (05/12 0421) Pulse Rate: 82 (05/12 0421)  Labs: Recent Labs    01/15/23 1842 01/16/23 0526 01/17/23 0505 01/18/23 0654  HGB 10.4* 8.9* 9.0*  --   HCT 33.4* 28.2* 28.9*  --   PLT 189 144* 143*  --   APTT 40*  --   --   --   LABPROT 22.6* 24.1* 25.9* 26.0*  INR 2.0* 2.1* 2.3* 2.4*  CREATININE 1.16* 1.25* 1.09*  --   CKTOTAL 34*  --   --   --      Estimated Creatinine Clearance: 32.8 mL/min (A) (by C-G formula based on SCr of 1.09 mg/dL (H)). Medications:  PTA Warfarin 2mg  M-F and 1mg  Sat and Sun - LD dose taken 5/8 @ 2200  Assessment: 89 yr female admitted with sepsis/UTI. Patient recently completed course of cefdinir for UTI and presents to ED with fever. PMH significant for COPD, PE, AFib, CKD, PAD, CHF. Pharmacy consulted to continue warfarin while admitted.  Today, 01/18/2023: INR remains therapeutic after several doses empirically reduced for concomitant Flagyl CBC: 5/11 - Hgb & Plt both  low but stable (suspect primarily dilutional); Hgb noted to be low on admission No bleeding noted per RN/MD Heart-healthy diet ordered; intake not charted Drug interactions with warfarin:  Flagyl started for intra-abdominal coverage; can increase INR via reduced warfarin clearance Rocephin can increase warfarin sensitivity  Goal of Therapy:  INR 2-3 Monitor platelets by anticoagulation protocol: Yes   Plan:  Warfarin 1 mg today (PTA dose) Significant interaction with Flagyl noted: if INR continues to trend up on 5/13, consider keeping dose at 1 mg daily while on Flagyl Daily PT/INR; CBC at least q72 hr Monitor for s/s bleeding or thrombosis  Sinahi Knights A, PharmD 01/18/2023,11:23 AM

## 2023-01-18 NOTE — Progress Notes (Signed)
PROGRESS NOTE    [name]  ZOX:[id number] DOB: 1928-05-28 DOA: 01/15/2023 PCP: Camie Patience, FNP    Brief Narrative:  89 year old female with chronic multiple medical issues, history significant for COPD and chronic hypoxemia on 2 L oxygen at home, paroxysmal atrial fibrillation on Coumadin, pulmonary embolism, hypothyroidism, severe aortic stenosis who presents from home with fever.  Reportedly she had temperature at home, Tylenol was given she did not respond so brought to the ER.  She recently finished cefdinir for UTI.  Patient was recently admitted to the hospital and went to a SNF and subsequently going home with her daughter.  Urinalysis was abnormal.  CT scan abdomen pelvis also showed uncomplicated left-sided colitis.   Assessment & Plan:   Acute UTI without hematuria, sepsis present on admission with presence of temperature 101, blood pressure 85/50, respiratory 33 and WBC count of 15.2. 2 possible source of infection.  UTI and left-sided colitis. Blood cultures negative.  Urine culture growing Pseudomonas. Currently on cefepime.  Will continue until final cultures. Flagyl due to presence of diverticulitis.  Left-sided diverticulitis: Minimal symptoms.  Tolerating regular diet.  Will treat with 7 days of antibiotics.  Chronic medical issues including Hypothyroidism, on Synthroid.  Continued. CKD stage IIIb, at about her baseline.  Monitor on treatment. COPD and chronic hypoxemia, chronic and stable. Multiple ulcers, not infected.  Continue wound care. Permanent atrial fibrillation, on Coumadin therapy.  Stable. Hypomagnesemia, replaced and improved.   Pressure Injury 01/16/23 Heel Left Stage 3 -  Full thickness tissue loss. Subcutaneous fat may be visible but bone, tendon or muscle are NOT exposed. (Active)  01/16/23 0022  Location: Heel  Location Orientation: Left  Staging: Stage 3 -  Full thickness tissue loss. Subcutaneous fat may be visible but bone, tendon or  muscle are NOT exposed.  Wound Description (Comments):   Present on Admission: Yes     Pressure Injury 01/16/23 Heel Right Deep Tissue Pressure Injury - Purple or maroon localized area of discolored intact skin or blood-filled blister due to damage of underlying soft tissue from pressure and/or shear. (Active)  01/16/23 0023  Location: Heel  Location Orientation: Right  Staging: Deep Tissue Pressure Injury - Purple or maroon localized area of discolored intact skin or blood-filled blister due to damage of underlying soft tissue from pressure and/or shear.  Wound Description (Comments):   Present on Admission:      Pressure Injury 01/16/23 Sacrum Deep Tissue Pressure Injury - Purple or maroon localized area of discolored intact skin or blood-filled blister due to damage of underlying soft tissue from pressure and/or shear. (Active)  01/16/23 0025  Location: Sacrum  Location Orientation:   Staging: Deep Tissue Pressure Injury - Purple or maroon localized area of discolored intact skin or blood-filled blister due to damage of underlying soft tissue from pressure and/or shear.  Wound Description (Comments):   Present on Admission: Yes       DVT prophylaxis: Therapeutic on Coumadin   Code Status: DNR Family Communication: No family at the bedside.  Daughter unable to pick up the phone. Disposition Plan: Status is: Inpatient Remains inpatient appropriate because: IV antibiotics, needs SNF.     Consultants:  None  Procedures:  None  Antimicrobials:  Levaquin and Flagyl 5/9--- 5/10 Rocephin and Flagyl 5/10--- Cefepime and Flagyl 5/11---   Subjective:  Patient seen and examined.  More sleepy today. Nursing staff reported more confusion and impulsiveness yesterday evening.  Remains afebrile.  Blood pressures are adequate.  No  family at the bedside.  Objective: Vitals:   01/17/23 0451 01/17/23 1324 01/17/23 2109 01/18/23 0421  BP: (!) 138/55 (!) 177/78 (!) 142/78 (!) 153/71   Pulse: 71 80 83 82  Resp: 20 20 18 20   Temp: 97.9 F (36.6 C) 97.7 F (36.5 C) 98 F (36.7 C) 97.6 F (36.4 C)  TempSrc: Oral Oral  Oral  SpO2: 97% 97% 94% 95%  Weight:      Height:        Intake/Output Summary (Last 24 hours) at 01/18/2023 1042 Last data filed at 01/17/2023 2030 Gross per 24 hour  Intake 340 ml  Output --  Net 340 ml    Filed Weights   01/15/23 1826  Weight: 72.3 kg    Examination:  General exam: Chronically sick looking.  Frail and debilitated.  Not in any distress.  Sleepy today. Respiratory system: No added sounds. Cardiovascular system: S1 & S2 heard, RRR. No pedal edema. Gastrointestinal system: Abdomen is nondistended, soft and nontender. No organomegaly or masses felt. Normal bowel sounds heard. Central nervous system: Mostly sleepy.  No focal neurological deficits.  Gross generalized weakness. Extremities:  Multiple pressure ulcers along with large ulcer on the left heel as documented above.    Data Reviewed: I have personally reviewed following labs and imaging studies  CBC: Recent Labs  Lab 01/15/23 1842 01/16/23 0526 01/17/23 0505  WBC 15.2* 10.4 7.9  NEUTROABS 13.0* 8.2*  --   HGB 10.4* 8.9* 9.0*  HCT 33.4* 28.2* 28.9*  MCV 96.0 96.2 95.7  PLT 189 144* 143*    Basic Metabolic Panel: Recent Labs  Lab 01/15/23 1842 01/16/23 0526 01/17/23 0505  NA 133* 131* 130*  K 3.5 3.5 3.7  CL 95* 95* 94*  CO2 28 28 28   GLUCOSE 98 92 94  BUN 15 16 15   CREATININE 1.16* 1.25* 1.09*  CALCIUM 9.1 8.5* 8.9  MG 1.6* 2.1  --   PHOS 2.7 3.0  --     GFR: Estimated Creatinine Clearance: 32.8 mL/min (A) (by C-G formula based on SCr of 1.09 mg/dL (H)). Liver Function Tests: Recent Labs  Lab 01/15/23 1842 01/16/23 0526  AST 30 43*  ALT 14 19  ALKPHOS 131* 107  BILITOT 1.4* 1.3*  PROT 6.4* 5.3*  ALBUMIN 2.8* 2.3*    No results for input(s): "LIPASE", "AMYLASE" in the last 168 hours. No results for input(s): "AMMONIA" in the last  168 hours. Coagulation Profile: Recent Labs  Lab 01/15/23 1842 01/16/23 0526 01/17/23 0505 01/18/23 0654  INR 2.0* 2.1* 2.3* 2.4*    Cardiac Enzymes: Recent Labs  Lab 01/15/23 1842  CKTOTAL 34*    BNP (last 3 results) No results for input(s): "PROBNP" in the last 8760 hours. HbA1C: No results for input(s): "HGBA1C" in the last 72 hours. CBG: No results for input(s): "GLUCAP" in the last 168 hours. Lipid Profile: No results for input(s): "CHOL", "HDL", "LDLCALC", "TRIG", "CHOLHDL", "LDLDIRECT" in the last 72 hours. Thyroid Function Tests: Recent Labs    01/15/23 1842  TSH 0.997    Anemia Panel: Recent Labs    01/15/23 1842  VITAMINB12 784  FOLATE 9.5  FERRITIN 162  TIBC 204*  IRON 14*  RETICCTPCT 1.1    Sepsis Labs: Recent Labs  Lab 01/15/23 1842  PROCALCITON 0.10  LATICACIDVEN 1.0     Recent Results (from the past 240 hour(s))  Urine Culture     Status: Abnormal (Preliminary result)   Collection Time: 01/15/23  6:32 PM  Specimen: Urine, Random  Result Value Ref Range Status   Specimen Description   Final    URINE, RANDOM Performed at [facility], [address] [place], [place] [address]    Special Requests   Final    NONE Reflexed from [id number] Performed at [facility], [address], [place], [place] [address]    Culture (A)  Final    >=100,000 COLONIES/mL PSEUDOMONAS AERUGINOSA SUSCEPTIBILITIES TO FOLLOW Performed at [facility], [address], [place], [place] [address]    Report Status PENDING  Incomplete  Blood Culture (routine x 2)     Status: None (Preliminary result)   Collection Time: 01/15/23  6:41 PM   Specimen: BLOOD RIGHT ARM  Result Value Ref Range Status   Specimen Description   Final    BLOOD RIGHT ARM Performed at [facility], [address] [place], [place] [address]    Special Requests   Final    BOTTLES DRAWN AEROBIC AND ANAEROBIC Blood Culture adequate  volume Performed at [facility], [address], [place], [place] [address]    Culture   Final    NO GROWTH 2 DAYS Performed at [facility], [address] [place], [place] [address]    Report Status PENDING  Incomplete  Blood Culture (routine x 2)     Status: None (Preliminary result)   Collection Time: 01/15/23  6:43 PM   Specimen: BLOOD LEFT ARM  Result Value Ref Range Status   Specimen Description   Final    BLOOD LEFT ARM Performed at [facility], [address] [place], [place] [address]    Special Requests   Final    BOTTLES DRAWN AEROBIC AND ANAEROBIC Blood Culture adequate volume Performed at [facility], [address], [place], [place] [address]    Culture   Final    NO GROWTH 2 DAYS Performed at [facility], [address] [place], [place] [address]    Report Status PENDING  Incomplete  Resp panel by RT-PCR (RSV, Flu A&B, Covid) Anterior Nasal Swab     Status: None   Collection Time: 01/15/23  7:02 PM   Specimen: Anterior Nasal Swab  Result Value Ref Range Status   SARS Coronavirus 2 by RT PCR NEGATIVE NEGATIVE Final    Comment: (NOTE) SARS-CoV-2 target nucleic acids are NOT DETECTED.  The SARS-CoV-2 RNA is generally detectable in upper respiratory specimens during the acute phase of infection. The lowest concentration of SARS-CoV-2 viral copies this assay can detect is 138 copies/mL. A negative result does not preclude SARS-Cov-2 infection and should not be used as the sole basis for treatment or other patient management decisions. A negative result may occur with  improper specimen collection/handling, submission of specimen other than nasopharyngeal swab, presence of viral mutation(s) within the areas targeted by this assay, and inadequate number of viral copies(<138 copies/mL). A negative result must be combined with clinical observations, patient history, and epidemiological information. The  expected result is Negative.  Fact Sheet for Patients:  BloggerCourse.com  Fact Sheet for Healthcare Providers:  SeriousBroker.it  This test is no t yet approved or cleared by the [place] FDA and  has been authorized for detection and/or diagnosis of SARS-CoV-2 by FDA under an Emergency Use Authorization (EUA). This EUA will remain  in effect (meaning this test can be used) for the duration of the COVID-19 declaration under Section 564(b)(1)  of the Act, 21 U.S.C.section 360bbb-3(b)(1), unless the authorization is terminated  or revoked sooner.       Influenza A by PCR NEGATIVE NEGATIVE Final   Influenza B by PCR NEGATIVE NEGATIVE Final    Comment: (NOTE) The Xpert Xpress SARS-CoV-2/FLU/RSV plus assay is intended as an aid in the diagnosis of influenza from Nasopharyngeal swab specimens and should not be used as a sole basis for treatment. Nasal washings and aspirates are unacceptable for Xpert Xpress SARS-CoV-2/FLU/RSV testing.  Fact Sheet for Patients: BloggerCourse.com  Fact Sheet for Healthcare Providers: SeriousBroker.it  This test is not yet approved or cleared by the [place] FDA and has been authorized for detection and/or diagnosis of SARS-CoV-2 by FDA under an Emergency Use Authorization (EUA). This EUA will remain in effect (meaning this test can be used) for the duration of the COVID-19 declaration under Section 564(b)(1) of the Act, 21 U.S.C. section 360bbb-3(b)(1), unless the authorization is terminated or revoked.     Resp Syncytial Virus by PCR NEGATIVE NEGATIVE Final    Comment: (NOTE) Fact Sheet for Patients: BloggerCourse.com  Fact Sheet for Healthcare Providers: SeriousBroker.it  This test is not yet approved or cleared by the [place] FDA and has been authorized for detection and/or  diagnosis of SARS-CoV-2 by FDA under an Emergency Use Authorization (EUA). This EUA will remain in effect (meaning this test can be used) for the duration of the COVID-19 declaration under Section 564(b)(1) of the Act, 21 U.S.C. section 360bbb-3(b)(1), unless the authorization is terminated or revoked.  Performed at [facility], [address], [place], [place] [address]          Radiology Studies: No results found.      Scheduled Meds:  ascorbic acid  250 mg Oral BID   feeding supplement  237 mL Oral BID BM   gabapentin  300 mg Oral BID   leptospermum manuka honey  1 Application Topical Daily   levothyroxine  100 mcg Oral q morning   metroNIDAZOLE  500 mg Oral Q12H   multivitamin with minerals  1 tablet Oral Daily   pantoprazole  40 mg Oral Daily   Warfarin - Pharmacist Dosing Inpatient   Does not apply q1600   zinc sulfate  220 mg Oral Daily   Continuous Infusions:  ceFEPime (MAXIPIME) IV Stopped (01/17/23 1324)      LOS: 2 days    Time spent: 35 minutes    Dorcas Carrow, MD [facility] Pager [phone]

## 2023-01-18 NOTE — Plan of Care (Signed)
  Problem: Nutrition: Goal: Adequate nutrition will be maintained Outcome: Progressing   Problem: Elimination: Goal: Will not experience complications related to bowel motility Outcome: Progressing   

## 2023-01-19 DIAGNOSIS — K5732 Diverticulitis of large intestine without perforation or abscess without bleeding: Secondary | ICD-10-CM | POA: Diagnosis not present

## 2023-01-19 DIAGNOSIS — N3000 Acute cystitis without hematuria: Secondary | ICD-10-CM | POA: Diagnosis not present

## 2023-01-19 LAB — PROTIME-INR
INR: 2.8 — ABNORMAL HIGH (ref 0.8–1.2)
Prothrombin Time: 29.7 seconds — ABNORMAL HIGH (ref 11.4–15.2)

## 2023-01-19 LAB — CULTURE, BLOOD (ROUTINE X 2): Special Requests: ADEQUATE

## 2023-01-19 LAB — GLUCOSE, CAPILLARY: Glucose-Capillary: 90 mg/dL (ref 70–99)

## 2023-01-19 MED ORDER — SODIUM CHLORIDE 0.9 % IV SOLN
2.0000 g | INTRAVENOUS | Status: DC
  Administered 2023-01-19: 2 g via INTRAVENOUS

## 2023-01-19 MED ORDER — WARFARIN SODIUM 1 MG PO TABS
1.0000 mg | ORAL_TABLET | Freq: Once | ORAL | Status: AC
  Administered 2023-01-19: 1 mg via ORAL
  Filled 2023-01-19: qty 1

## 2023-01-19 NOTE — Progress Notes (Signed)
ANTICOAGULATION CONSULT NOTE  Pharmacy Consult for Warfarin Indication: atrial fibrillation, H/O PE  Allergies  Allergen Reactions   Amoxicillin Other (See Comments)    Unknown reaction  Tolerates Keflex, Cefepime   Azithromycin Other (See Comments)    Unknown reaction    Codeine Other (See Comments)    Unknown reaction    Erythromycin Other (See Comments)    Unknown reaction    Green Dyes Other (See Comments)    Allergic to ALL dyes   Iodine Other (See Comments)    Unknown reaction    Misc. Sulfonamide Containing Compounds    Oxycodone Other (See Comments)    Unknown reaction      Oxycodone-Acetaminophen Other (See Comments)    Unknown reaction    Penicillins Other (See Comments)    Unknown reaction  Tolerates Keflex, Cefepime   Sulfasalazine    Tramadol Hives    Patient Measurements: Height: 5\' 5"  (165.1 cm) Weight: 72.3 kg (159 lb 6.3 oz) IBW/kg (Calculated) : 57   Vital Signs: Temp: 98.2 F (36.8 C) (05/13 0842) Temp Source: Oral (05/13 0842) BP: 129/47 (05/13 0842) Pulse Rate: 79 (05/13 0842)  Labs: Recent Labs    01/17/23 0505 01/18/23 0654 01/19/23 0428  HGB 9.0*  --   --   HCT 28.9*  --   --   PLT 143*  --   --   LABPROT 25.9* 26.0* 29.7*  INR 2.3* 2.4* 2.8*  CREATININE 1.09*  --   --      Estimated Creatinine Clearance: 32.8 mL/min (A) (by C-G formula based on SCr of 1.09 mg/dL (H)). Medications:  PTA Warfarin 2mg  M-F and 1mg  Sat and Sun - LD dose taken 5/8 @ 2200  Assessment: 89 yr female admitted with sepsis/UTI. Patient recently completed course of cefdinir for UTI and presents to ED with fever. PMH significant for COPD, PE, AFib, CKD, PAD, CHF. Pharmacy consulted to continue warfarin while admitted.  Today, 01/19/2023: INR remains therapeutic after several doses empirically reduced for concomitant Flagyl but has been trending upward since admission 2 >> 2.8 now CBC: 5/11 - Hgb & Plt both low but stable (suspect primarily dilutional);  Hgb noted to be low on admission No bleeding noted per RN/MD Heart-healthy diet ordered; 50-95% charted Drug interactions with warfarin:  Flagyl started for intra-abdominal coverage; can increase INR via reduced warfarin clearance Rocephin can increase warfarin sensitivity - switched to cefepime 5/11  Goal of Therapy:  INR 2-3 Monitor platelets by anticoagulation protocol: Yes   Plan:  Repeat warfarin 1 mg again today at 1600 Significant interaction with Flagyl noted: if INR continues to trend up on 5/14, consider keeping dose at 1 mg daily while on Flagyl Daily PT/INR; CBC at least q72 hr Monitor for s/s bleeding or thrombosis   Hessie Knows, PharmD, BCPS Secure Chat if ?s 01/19/2023 10:33 AM

## 2023-01-19 NOTE — TOC Progression Note (Signed)
Transition of Care [facility]) - Progression Note    Patient Details  Name: [name] MRN: [id number] Date of Birth: 11-Jan-1928  [facility] CM/SW Contact  Afiya Ferrebee, Olegario Messier, RN Phone Number: 01/19/2023, 10:54 AM  Clinical Narrative: Noted per MD-dtr [name] wants d/c home-left vm w/[facility]-await call back. [facility] rep Kasi informed of HHRN/PT/OT/aide/CSW-will await to confirm services.      Expected Discharge Plan: Home w Home Health Services Barriers to Discharge: Continued Medical Work up  Expected Discharge Plan and Services   Discharge Planning Services: CM Consult   Living arrangements for the past 2 months: Single Family Home                             HH Agency: Other - See comment ([facility]) Date HH Agency Contacted: 01/19/23 Time HH Agency Contacted: 1054 Representative spoke with at [facility] Agency: Kasi   Social Determinants of Health (SDOH) Interventions SDOH Screenings   Food Insecurity: Food Insecurity Present (01/15/2023)  Housing: Low Risk  (01/15/2023)  Transportation Needs: No Transportation Needs (01/15/2023)  Utilities: Not At Risk (01/15/2023)  Tobacco Use: Medium Risk (01/16/2023)    Readmission Risk Interventions    11/18/2021   12:55 PM  Readmission Risk Prevention Plan  Transportation Screening Complete  PCP or Specialist Appt within 3-5 Days Complete  HRI or Home Care Consult Complete  Social Work Consult for Recovery Care Planning/Counseling Complete  Palliative Care Screening Complete  Medication Review Oceanographer) Complete

## 2023-01-19 NOTE — Progress Notes (Signed)
PROGRESS NOTE    [name]  ZOX:[id number] DOB: 1928/05/22 DOA: 01/15/2023 PCP: Camie Patience, FNP    Brief Narrative:  89 year old female with chronic multiple medical issues, history significant for COPD and chronic hypoxemia on 2 L oxygen at home, paroxysmal atrial fibrillation on Coumadin, pulmonary embolism, hypothyroidism, severe aortic stenosis who presents from home with fever.  Reportedly she had temperature at home, Tylenol was given she did not respond so brought to the ER.  She recently finished cefdinir for UTI.  Patient was recently admitted to the hospital and went to a SNF and subsequently going home with her daughter.  Urinalysis was abnormal.  CT scan abdomen pelvis also showed uncomplicated left-sided colitis.   Assessment & Plan:   Acute UTI without hematuria, sepsis present on admission with presence of temperature 101, blood pressure 85/50, respiratory 33 and WBC count of 15.2. Blood cultures negative.  Urine culture with Pseudomonas. Currently on cefepime day 3/7. Flagyl due to presence of diverticulitis.  Left-sided diverticulitis: Minimal symptoms.  Tolerating regular diet.  Will treat with 7 days of antibiotics.  Chronic medical issues including Hypothyroidism, on Synthroid.  Continued. CKD stage IIIb, at about her baseline.  Monitor on treatment. COPD and chronic hypoxemia, chronic and stable. Multiple ulcers, not infected.  Continue wound care. Permanent atrial fibrillation, on Coumadin therapy.  Stable. Hypomagnesemia, replaced and improved.   Disposition: PT OT recommended SNF placement. Discussed with patient's daughter on 5/12, she would try to arrange more care at home and take her home with home health.  Pressure Injury 01/16/23 Heel Left Stage 3 -  Full thickness tissue loss. Subcutaneous fat may be visible but bone, tendon or muscle are NOT exposed. (Active)  01/16/23 0022  Location: Heel  Location Orientation: Left  Staging: Stage 3 -  Full  thickness tissue loss. Subcutaneous fat may be visible but bone, tendon or muscle are NOT exposed.  Wound Description (Comments):   Present on Admission: Yes     Pressure Injury 01/16/23 Heel Right Deep Tissue Pressure Injury - Purple or maroon localized area of discolored intact skin or blood-filled blister due to damage of underlying soft tissue from pressure and/or shear. (Active)  01/16/23 0023  Location: Heel  Location Orientation: Right  Staging: Deep Tissue Pressure Injury - Purple or maroon localized area of discolored intact skin or blood-filled blister due to damage of underlying soft tissue from pressure and/or shear.  Wound Description (Comments):   Present on Admission:      Pressure Injury 01/16/23 Sacrum Deep Tissue Pressure Injury - Purple or maroon localized area of discolored intact skin or blood-filled blister due to damage of underlying soft tissue from pressure and/or shear. (Active)  01/16/23 0025  Location: Sacrum  Location Orientation:   Staging: Deep Tissue Pressure Injury - Purple or maroon localized area of discolored intact skin or blood-filled blister due to damage of underlying soft tissue from pressure and/or shear.  Wound Description (Comments):   Present on Admission: Yes       DVT prophylaxis: Therapeutic on Coumadin   Code Status: DNR Family Communication: No family at the bedside.   Disposition Plan: Status is: Inpatient Remains inpatient appropriate because: IV antibiotics,      Consultants:  None  Procedures:  None  Antimicrobials:  Levaquin and Flagyl 5/9--- 5/10 Rocephin and Flagyl 5/10--- Cefepime and Flagyl 5/11---   Subjective:  Patient seen and examined.  Today she is complaining of some pain on her left heel that she has  a sore.  Denies any other complaints.  She tells me that she would like to go home not to go to skilled rehab.  Afebrile.  Objective: Vitals:   01/18/23 1330 01/18/23 1947 01/19/23 0412 01/19/23 0842   BP: (!) 152/74 (!) 150/68 (!) 136/50 (!) 129/47  Pulse: 70 84 75 79  Resp: 16 18 20 20   Temp: 98.2 F (36.8 C) 98.5 F (36.9 C) 98.4 F (36.9 C) 98.2 F (36.8 C)  TempSrc: Oral Oral Oral Oral  SpO2: 100% 98% 93% (!) 88%  Weight:      Height:        Intake/Output Summary (Last 24 hours) at 01/19/2023 1011 Last data filed at 01/19/2023 0839 Gross per 24 hour  Intake 580 ml  Output 625 ml  Net -45 ml    Filed Weights   01/15/23 1826  Weight: 72.3 kg    Examination:  General exam: Chronically sick looking.  Frail and debilitated.  Eating breakfast. Respiratory system: No added sounds. Cardiovascular system: S1 & S2 heard, RRR. No pedal edema.  Multiple pressure ulcers as above.  She also has chronic ischemic changes on her toes. Gastrointestinal system: Abdomen is nondistended, soft and nontender. No organomegaly or masses felt. Normal bowel sounds heard. Central nervous system: Mostly sleepy.  No focal neurological deficits.  Gross generalized weakness. Extremities:  Multiple pressure ulcers along with stage 3 pressure ulcer left heel as documented above.    Data Reviewed: I have personally reviewed following labs and imaging studies  CBC: Recent Labs  Lab 01/15/23 1842 01/16/23 0526 01/17/23 0505  WBC 15.2* 10.4 7.9  NEUTROABS 13.0* 8.2*  --   HGB 10.4* 8.9* 9.0*  HCT 33.4* 28.2* 28.9*  MCV 96.0 96.2 95.7  PLT 189 144* 143*    Basic Metabolic Panel: Recent Labs  Lab 01/15/23 1842 01/16/23 0526 01/17/23 0505  NA 133* 131* 130*  K 3.5 3.5 3.7  CL 95* 95* 94*  CO2 28 28 28   GLUCOSE 98 92 94  BUN 15 16 15   CREATININE 1.16* 1.25* 1.09*  CALCIUM 9.1 8.5* 8.9  MG 1.6* 2.1  --   PHOS 2.7 3.0  --     GFR: Estimated Creatinine Clearance: 32.8 mL/min (A) (by C-G formula based on SCr of 1.09 mg/dL (H)). Liver Function Tests: Recent Labs  Lab 01/15/23 1842 01/16/23 0526  AST 30 43*  ALT 14 19  ALKPHOS 131* 107  BILITOT 1.4* 1.3*  PROT 6.4* 5.3*   ALBUMIN 2.8* 2.3*    No results for input(s): "LIPASE", "AMYLASE" in the last 168 hours. No results for input(s): "AMMONIA" in the last 168 hours. Coagulation Profile: Recent Labs  Lab 01/15/23 1842 01/16/23 0526 01/17/23 0505 01/18/23 0654 01/19/23 0428  INR 2.0* 2.1* 2.3* 2.4* 2.8*    Cardiac Enzymes: Recent Labs  Lab 01/15/23 1842  CKTOTAL 34*    BNP (last 3 results) No results for input(s): "PROBNP" in the last 8760 hours. HbA1C: No results for input(s): "HGBA1C" in the last 72 hours. CBG: No results for input(s): "GLUCAP" in the last 168 hours. Lipid Profile: No results for input(s): "CHOL", "HDL", "LDLCALC", "TRIG", "CHOLHDL", "LDLDIRECT" in the last 72 hours. Thyroid Function Tests: No results for input(s): "TSH", "T4TOTAL", "FREET4", "T3FREE", "THYROIDAB" in the last 72 hours.  Anemia Panel: No results for input(s): "VITAMINB12", "FOLATE", "FERRITIN", "TIBC", "IRON", "RETICCTPCT" in the last 72 hours.  Sepsis Labs: Recent Labs  Lab 01/15/23 1842  PROCALCITON 0.10  LATICACIDVEN 1.0  Recent Results (from the past 240 hour(s))  Urine Culture     Status: Abnormal   Collection Time: 01/15/23  6:32 PM   Specimen: Urine, Random  Result Value Ref Range Status   Specimen Description   Final    URINE, RANDOM Performed at [facility], [address] [place], [place] [address]    Special Requests   Final    NONE Reflexed from [id number] Performed at [facility], [address] [place], [place] [address]    Culture >=100,000 COLONIES/mL PSEUDOMONAS AERUGINOSA (A)  Final   Report Status 01/18/2023 FINAL  Final   Organism ID, Bacteria PSEUDOMONAS AERUGINOSA (A)  Final      Susceptibility   Pseudomonas aeruginosa - MIC*    CEFTAZIDIME 4 SENSITIVE Sensitive     CIPROFLOXACIN <=0.25 SENSITIVE Sensitive     GENTAMICIN 4 SENSITIVE Sensitive     IMIPENEM 2 SENSITIVE Sensitive     PIP/TAZO 8 SENSITIVE Sensitive      CEFEPIME 2 SENSITIVE Sensitive     * >=100,000 COLONIES/mL PSEUDOMONAS AERUGINOSA  Blood Culture (routine x 2)     Status: None (Preliminary result)   Collection Time: 01/15/23  6:41 PM   Specimen: BLOOD RIGHT ARM  Result Value Ref Range Status   Specimen Description   Final    BLOOD RIGHT ARM Performed at [facility], [address] [place], [place] [address]    Special Requests   Final    BOTTLES DRAWN AEROBIC AND ANAEROBIC Blood Culture adequate volume Performed at [facility], [address] [place], [place] [address]    Culture   Final    NO GROWTH 4 DAYS Performed at [facility], [address] [place], [place] [address]    Report Status PENDING  Incomplete  Blood Culture (routine x 2)     Status: None (Preliminary result)   Collection Time: 01/15/23  6:43 PM   Specimen: BLOOD LEFT ARM  Result Value Ref Range Status   Specimen Description   Final    BLOOD LEFT ARM Performed at [facility], [address], [place], [place] [address]    Special Requests   Final    BOTTLES DRAWN AEROBIC AND ANAEROBIC Blood Culture adequate volume Performed at [facility], [address], [place], [place] [address]    Culture   Final    NO GROWTH 4 DAYS Performed at [facility], [address], [place], [place] [address]    Report Status PENDING  Incomplete  Resp panel by RT-PCR (RSV, Flu A&B, Covid) Anterior Nasal Swab     Status: None   Collection Time: 01/15/23  7:02 PM   Specimen: Anterior Nasal Swab  Result Value Ref Range Status   SARS Coronavirus 2 by RT PCR NEGATIVE NEGATIVE Final    Comment: (NOTE) SARS-CoV-2 target nucleic acids are NOT DETECTED.  The SARS-CoV-2 RNA is generally detectable in upper respiratory specimens during the acute phase of infection. The lowest concentration of SARS-CoV-2 viral copies this assay can detect is 138 copies/mL. A negative result does not preclude SARS-Cov-2 infection  and should not be used as the sole basis for treatment or other patient management decisions. A negative result may occur with  improper specimen collection/handling, submission of specimen other than nasopharyngeal swab, presence of viral mutation(s) within the areas targeted by this assay, and inadequate number of viral copies(<138 copies/mL). A negative result must be combined  with clinical observations, patient history, and epidemiological information. The expected result is Negative.  Fact Sheet for Patients:  BloggerCourse.com  Fact Sheet for Healthcare Providers:  SeriousBroker.it  This test is no t yet approved or cleared by the [place] FDA and  has been authorized for detection and/or diagnosis of SARS-CoV-2 by FDA under an Emergency Use Authorization (EUA). This EUA will remain  in effect (meaning this test can be used) for the duration of the COVID-19 declaration under Section 564(b)(1) of the Act, 21 U.S.C.section 360bbb-3(b)(1), unless the authorization is terminated  or revoked sooner.       Influenza A by PCR NEGATIVE NEGATIVE Final   Influenza B by PCR NEGATIVE NEGATIVE Final    Comment: (NOTE) The Xpert Xpress SARS-CoV-2/FLU/RSV plus assay is intended as an aid in the diagnosis of influenza from Nasopharyngeal swab specimens and should not be used as a sole basis for treatment. Nasal washings and aspirates are unacceptable for Xpert Xpress SARS-CoV-2/FLU/RSV testing.  Fact Sheet for Patients: BloggerCourse.com  Fact Sheet for Healthcare Providers: SeriousBroker.it  This test is not yet approved or cleared by the [place] FDA and has been authorized for detection and/or diagnosis of SARS-CoV-2 by FDA under an Emergency Use Authorization (EUA). This EUA will remain in effect (meaning this test can be used) for the duration of the COVID-19 declaration  under Section 564(b)(1) of the Act, 21 U.S.C. section 360bbb-3(b)(1), unless the authorization is terminated or revoked.     Resp Syncytial Virus by PCR NEGATIVE NEGATIVE Final    Comment: (NOTE) Fact Sheet for Patients: BloggerCourse.com  Fact Sheet for Healthcare Providers: SeriousBroker.it  This test is not yet approved or cleared by the [place] FDA and has been authorized for detection and/or diagnosis of SARS-CoV-2 by FDA under an Emergency Use Authorization (EUA). This EUA will remain in effect (meaning this test can be used) for the duration of the COVID-19 declaration under Section 564(b)(1) of the Act, 21 U.S.C. section 360bbb-3(b)(1), unless the authorization is terminated or revoked.  Performed at [facility], [address] [place], [place] [address]          Radiology Studies: No results found.      Scheduled Meds:  ascorbic acid  250 mg Oral BID   feeding supplement  237 mL Oral BID BM   gabapentin  300 mg Oral BID   leptospermum manuka honey  1 Application Topical Daily   levothyroxine  100 mcg Oral q morning   metroNIDAZOLE  500 mg Oral Q12H   multivitamin with minerals  1 tablet Oral Daily   pantoprazole  40 mg Oral Daily   Warfarin - Pharmacist Dosing Inpatient   Does not apply q1600   zinc sulfate  220 mg Oral Daily   Continuous Infusions:  ceFEPime (MAXIPIME) IV 2 g (01/18/23 1250)      LOS: 3 days    Time spent: 35 minutes    Dorcas Carrow, MD [facility] Pager [phone]

## 2023-01-19 NOTE — Progress Notes (Signed)
IV infiltrated. Antibiotic stopped. Pt attempted x3, IV consult was entered to assist with access.

## 2023-01-19 NOTE — Progress Notes (Signed)
Physical Therapy Treatment Patient Details Name: KELLY-ANNE FILBECK MRN: [id number] DOB: Dec 07, 1927 Today's Date: 01/19/2023   History of Present Illness 89 y.o. female with medical history significant of COPD, PE/ A.fib on coumadin, CKD 3b, PAD, chronic leg wounds, diastolic CHF, hypothyroidism Severe aortic stenosis. Pt had recent admission for Covid 2/15-11/07/22, DCed to SNF, home since March. Admitted for   Acute cystitis without hematuria, Sepsis    PT Comments     Pt admitted with above diagnosis.  Pt currently with functional limitations due to the deficits listed below (see PT Problem List). Pt in bed when PT arrived. Pt indicated she did not want any more of her lunch. Pt sates she had gotten OOB earlier with nursing staff to [facility] and was up in the recliner. Pt reported she was cold, R knee pain and L distal LE pain. Pt required increased time and cues for all functional mobility tasks with min A for supine <> sit, min A for STS, min guard for gait tasks 18 feet with RW and multimodal cues for proper UE placement on RW. Pt elected to return to bed, all needs met. PT monitored O2 t/o intervention with pt denying SOB or dizziness, please see below. Pt will benefit from acute skilled PT to increase their independence and safety with mobility to allow discharge.   Pt reported she does not like the [place] and removed when in bed supplemental O2 PLOF, at rest with 2 L/min 98%, on RA at rest 94%.  Pt desaturated with gait to 82% and in 1;13 was unable to recover > 87% with cues for pursed lip breathing and supplemental O2 donned at 2 L/min and pt quickly recovered to 90% and end of tx session pt 97% on 2 L/min. Nurse made aware of desaturation with exertion.    Recommendations for follow up therapy are one component of a multi-disciplinary discharge planning process, led by the attending physician.  Recommendations may be updated based on patient status, additional functional criteria and insurance  authorization.  Follow Up Recommendations  Can patient physically be transported by private vehicle: No    Assistance Recommended at Discharge Frequent or constant Supervision/Assistance  Patient can return home with the following A lot of help with bathing/dressing/bathroom;Assistance with cooking/housework;Assist for transportation;Help with stairs or ramp for entrance;A little help with walking and/or transfers   Equipment Recommendations  None recommended by PT    Recommendations for Other Services       Precautions / Restrictions Precautions Precautions: Fall Restrictions Weight Bearing Restrictions: No     Mobility  Bed Mobility Overal bed mobility: Needs Assistance Bed Mobility: Supine to Sit, Sit to Supine     Supine to sit: Min guard, HOB elevated Sit to supine: Min assist   General bed mobility comments: cues for encouragement and A for LEs for sit to supine    Transfers Overall transfer level: Needs assistance Equipment used: Rolling walker (2 wheels) Transfers: Sit to/from Stand, Bed to chair/wheelchair/BSC Sit to Stand: Min assist           General transfer comment: Required cues for hand placment    Ambulation/Gait Ambulation/Gait assistance: Min guard Gait Distance (Feet): 18 Feet Assistive device: Rolling walker (2 wheels) Gait Pattern/deviations: Step-to pattern, Trunk flexed Gait velocity: decreased     General Gait Details: pt electing to rest R UE/forarm on RW and hold center of walker with R UE, regardless of cues pt declined to mofity posture   Stairs  Wheelchair Mobility    Modified Rankin (Stroke Patients Only)       Balance Overall balance assessment: Needs assistance Sitting-balance support: Feet supported, No upper extremity supported Sitting balance-Leahy Scale: Fair     Standing balance support: Bilateral upper extremity supported, During functional activity, Reliant on assistive device for  balance Standing balance-Leahy Scale: Poor                              Cognition Arousal/Alertness: Awake/alert Behavior During Therapy: WFL for tasks assessed/performed Overall Cognitive Status: No family/caregiver present to determine baseline cognitive functioning Area of Impairment: Memory, Safety/judgement, Awareness                         Safety/Judgement: Decreased awareness of safety     General Comments: decreased STM        Exercises      General Comments        Pertinent Vitals/Pain Pain Assessment Pain Assessment: Faces Faces Pain Scale: Hurts even more Pain Location: L heel and R knee Pain Descriptors / Indicators: Sore, Aching Pain Intervention(s): Limited activity within patient's tolerance, Monitored during session, Repositioned    Home Living Family/patient expects to be discharged to:: Private residence Living Arrangements: Children Available Help at Discharge: Family;Available 24 hours/day Type of Home: House Home Access: Level entry       Home Layout: One level Home Equipment: Agricultural consultant (2 wheels);BSC/3in1      Prior Function            PT Goals (current goals can now be found in the care plan section) Acute Rehab PT Goals Patient Stated Goal: to go home PT Goal Formulation: With patient Time For Goal Achievement: 01/30/23 Potential to Achieve Goals: Fair    Frequency    Min 1X/week      PT Plan      Co-evaluation              AM-PAC PT "6 Clicks" Mobility   Outcome Measure  Help needed turning from your back to your side while in a flat bed without using bedrails?: A Little Help needed moving from lying on your back to sitting on the side of a flat bed without using bedrails?: A Little Help needed moving to and from a bed to a chair (including a wheelchair)?: A Little Help needed standing up from a chair using your arms (e.g., wheelchair or bedside chair)?: A Little Help needed to walk  in hospital room?: A Little Help needed climbing 3-5 steps with a railing? : Total 6 Click Score: 16    End of Session Equipment Utilized During Treatment: Gait belt Activity Tolerance: Patient limited by fatigue Patient left: with call bell/phone within reach Nurse Communication: Mobility status PT Visit Diagnosis: Unsteadiness on feet (R26.81);Difficulty in walking, not elsewhere classified (R26.2);Pain Pain - Right/Left: Right Pain - part of body: Knee     Time: 1208-1232 PT Time Calculation (min) (ACUTE ONLY): 24 min  Charges:  $Gait Training: 8-22 mins $Therapeutic Activity: 8-22 mins                      Johnny Bridge, PT Acute Rehab    Jacqualyn [name] 01/19/2023, 1:30 PM

## 2023-01-20 DIAGNOSIS — A4150 Gram-negative sepsis, unspecified: Secondary | ICD-10-CM | POA: Diagnosis not present

## 2023-01-20 DIAGNOSIS — N390 Urinary tract infection, site not specified: Secondary | ICD-10-CM | POA: Diagnosis not present

## 2023-01-20 LAB — PROTIME-INR
INR: 2.9 — ABNORMAL HIGH (ref 0.8–1.2)
Prothrombin Time: 30.7 seconds — ABNORMAL HIGH (ref 11.4–15.2)

## 2023-01-20 MED ORDER — SODIUM CHLORIDE 0.9 % IV SOLN: 1.0000 g | Freq: Two times a day (BID) | INTRAVENOUS | Status: DC

## 2023-01-20 MED ORDER — WARFARIN SODIUM 1 MG PO TABS
1.0000 mg | ORAL_TABLET | Freq: Once | ORAL | Status: AC
  Administered 2023-01-20: 1 mg via ORAL
  Filled 2023-01-20: qty 1

## 2023-01-20 MED ORDER — SODIUM CHLORIDE 0.9 % IV SOLN
2.0000 g | Freq: Two times a day (BID) | INTRAVENOUS | Status: DC
  Administered 2023-01-20 (×2): 2 g via INTRAVENOUS
  Filled 2023-01-20 (×3): qty 12.5

## 2023-01-20 MED ORDER — LEVOFLOXACIN 750 MG PO TABS: 375.0000 mg | ORAL_TABLET | Freq: Every day | ORAL | 0 refills | Status: DC

## 2023-01-20 NOTE — Progress Notes (Addendum)
The patient sat in the chair x 1.5 hours. Activity tolerated moderately well.

## 2023-01-20 NOTE — TOC Transition Note (Addendum)
Transition of Care [facility]) - CM/SW Discharge Note   Patient Details  Name: [name] MRN: [id number] Date of Birth: 1927-11-21  Transition of Care [facility]) CM/SW Contact:  Lanier Clam, RN Phone Number: 01/20/2023, 3:16 PM   Clinical Narrative:  Spoke to Phyllis-informed her we can support medical need per med necessity form,but PT[facility] will bill insurance & if denied it is the patient's responsibility.[name] currently declines PTAR, due to cost if insurance deny per PTAR quoted gestimate cost [phone]. Amber(nurse) to call [name].Supv Diana aware. PTAR forms will be in shadow chart just in case needed. No further CM needs.     Final next level of care: Home w Home Health Services Barriers to Discharge: No Barriers Identified   Patient Goals and CMS Choice CMS Medicare.gov Compare Post Acute Care list provided to:: Patient Represenative (must comment) Choice offered to / list presented to : Adult Children  Discharge Placement                         Discharge Plan and Services Additional resources added to the After Visit Summary for     Discharge Planning Services: CM Consult                        [facility] Agency: Other - See comment Trudi Ida [facility]) Date HH Agency Contacted: 01/19/23 Time HH Agency Contacted: 1054 Representative spoke with at [facility] Agency: Kasi  Social Determinants of Health (SDOH) Interventions SDOH Screenings   Food Insecurity: Food Insecurity Present (01/15/2023)  Housing: Low Risk  (01/15/2023)  Transportation Needs: No Transportation Needs (01/15/2023)  Utilities: Not At Risk (01/15/2023)  Tobacco Use: Medium Risk (01/16/2023)     Readmission Risk Interventions    11/18/2021   12:55 PM  Readmission Risk Prevention Plan  Transportation Screening Complete  PCP or Specialist Appt within 3-5 Days Complete  HRI or Home Care Consult Complete  Social Work Consult for Recovery Care Planning/Counseling Complete  Palliative Care Screening Complete  Medication  Review Oceanographer) Complete

## 2023-01-20 NOTE — Progress Notes (Signed)
The patient sat in the chair x 2.5 hours. Activity tolerated moderately well.

## 2023-01-20 NOTE — TOC Progression Note (Signed)
Transition of Care [facility]) - Progression Note    Patient Details  Name: [name] MRN: [id number] Date of Birth: 19-Feb-1928  Transition of Care [facility]) CM/SW Contact  Howell Rucks, RN Phone Number: 01/20/2023, 11:11 AM  Clinical Narrative:  Chat from MD inquiring confirmation of family discharge plan is for home with [facility] PT/OT/RN/Aide/CSW or short term rehab. NCM call to pt's dtr [name]), left vm with name and number requesting call back. MD states will wait confirmation from family prior to dc order. Will continue to follow     Expected Discharge Plan: Home w Home Health Services Barriers to Discharge: Continued Medical Work up  Expected Discharge Plan and Services   Discharge Planning Services: CM Consult   Living arrangements for the past 2 months: Single Family Home                             HH Agency: Other - See comment ([facility]) Date HH Agency Contacted: 01/19/23 Time HH Agency Contacted: 1054 Representative spoke with at [facility] Agency: Kasi   Social Determinants of Health (SDOH) Interventions SDOH Screenings   Food Insecurity: Food Insecurity Present (01/15/2023)  Housing: Low Risk  (01/15/2023)  Transportation Needs: No Transportation Needs (01/15/2023)  Utilities: Not At Risk (01/15/2023)  Tobacco Use: Medium Risk (01/16/2023)    Readmission Risk Interventions    11/18/2021   12:55 PM  Readmission Risk Prevention Plan  Transportation Screening Complete  PCP or Specialist Appt within 3-5 Days Complete  HRI or Home Care Consult Complete  Social Work Consult for Recovery Care Planning/Counseling Complete  Palliative Care Screening Complete  Medication Review Oceanographer) Complete

## 2023-01-20 NOTE — Progress Notes (Signed)
ANTICOAGULATION CONSULT NOTE  Pharmacy Consult for Warfarin Indication: atrial fibrillation, history of PE  Allergies  Allergen Reactions   Amoxicillin Other (See Comments)    Unknown reaction  Tolerates Keflex, Cefepime   Azithromycin Other (See Comments)    Unknown reaction    Codeine Other (See Comments)    Unknown reaction    Erythromycin Other (See Comments)    Unknown reaction    Green Dyes Other (See Comments)    Allergic to ALL dyes   Iodine Other (See Comments)    Unknown reaction    Misc. Sulfonamide Containing Compounds    Oxycodone Other (See Comments)    Unknown reaction      Oxycodone-Acetaminophen Other (See Comments)    Unknown reaction    Penicillins Other (See Comments)    Unknown reaction  Tolerates Keflex, Cefepime   Sulfasalazine    Tramadol Hives    Patient Measurements: Height: 5\' 5"  (165.1 cm) Weight: 72.3 kg (159 lb 6.3 oz) IBW/kg (Calculated) : 57   Vital Signs: Temp: 98.7 F (37.1 C) (05/14 0422) Temp Source: Oral (05/14 0422) BP: 105/56 (05/14 0422) Pulse Rate: 85 (05/14 0422)  Labs: Recent Labs    01/18/23 0654 01/19/23 0428 01/20/23 0514  LABPROT 26.0* 29.7* 30.7*  INR 2.4* 2.8* 2.9*     Estimated Creatinine Clearance: 32.8 mL/min (A) (by C-G formula based on SCr of 1.09 mg/dL (H)). Medications:  PTA Warfarin 2mg  M-F and 1mg  Sat and Sun  -last dose PTA taken 5/8 @ 2200  Assessment: 89 yr female admitted with sepsis/UTI. Patient recently completed course of cefdinir for UTI and presents to ED with fever. PMH significant for COPD, PE, AFib, CKD, PAD, CHF. Pharmacy consulted to continue warfarin while admitted.  Today, 01/20/2023: INR = 2.9 remains therapeutic on empirically reduced dose due to DDI with metronidazole. INR on upper end of therapeutic range. CBC: Last checked 5/11. Hgb & Plt slightly low but stable No bleeding reported Heart-healthy diet; less than 50% meal intake charted Drug interactions with warfarin:   Metronidazole started for intra-abdominal coverage; can increase INR via reduced warfarin clearance Cephalosporins have some potential to enhance effects of warfarin  Goal of Therapy:  INR 2-3 Monitor platelets by anticoagulation protocol: Yes   Plan:  Repeat warfarin 1 mg PO today Significant interaction with metronidazole noted INR daily. CBC with AM labs tomorrow.  Monitor for signs of bleeding.  If patient were to discharge on interacting antibiotics (metronidazole or FQ) > would recommend to continue with reduced dose of warfarin 1 mg PO daily while on antibiotics with INR check within 72 hours of hospital discharge and at the completion of antibiotic regimen.  Cindi Carbon, PharmD 01/20/23 8:51 AM

## 2023-01-20 NOTE — Progress Notes (Signed)
PHARMACY NOTE:  ANTIMICROBIAL RENAL DOSAGE ADJUSTMENT  Current antimicrobial regimen includes a mismatch between antimicrobial dosage and estimated renal function.  As per policy approved by the Pharmacy & Therapeutics and Medical Executive Committees, the antimicrobial dosage will be adjusted accordingly.  Current antimicrobial dosage:  Cefepime 2 g IV q24h  Indication: Pseudomonas UTI, intra-abdominal infection  Renal Function:  Estimated Creatinine Clearance: 32.8 mL/min (A) (by C-G formula based on SCr of 1.09 mg/dL (H)).    Antimicrobial dosage has been changed to:  Cefepime 2 g IV q12h  Cindi Carbon, PharmD 01/20/23 9:03 AM

## 2023-01-20 NOTE — Progress Notes (Signed)
Call placed to patients daughter [name] to discuss upcoming discharge. Daughter states that she feels comfortable for pt to return home. She has been followed by [facility] PT and RN. She stated that she is hoping the pt will be able to transfer well and is hopeful mobility will improve.

## 2023-01-20 NOTE — Progress Notes (Signed)
Occupational Therapy Treatment Patient Details Name: [name] MRN: [id number] DOB: 04-11-1928 Today's Date: 01/20/2023   History of present illness Ms. Threats is a 89 yr old female admitted to the hospital with acute UTI and sepsis. She was hospitalized around February/March of this yr for COVID-19. PMH: COPD, PE, a fib, PAD, CKD 3, chronic leg wounds, diastolic CHF aortic stenosis   OT comments  Pt was seen for ADL instruction and functional strengthening. She required assist for sit to stand, a stand-pivot transfer using a RW, and for toileting at bedside commode level. She reported feelings of L heel discomfort & fatigue, and was further noted to be with compromised endurance and generalized strength deficits. She required slight encouragement for progressive participation. Continue OT plan of care.   uu Recommendations for follow up therapy are one component of a multi-disciplinary discharge planning process, led by the attending physician.  Recommendations may be updated based on patient status, additional functional criteria and insurance authorization.    Assistance Recommended at Discharge Frequent or constant Supervision/Assistance  Patient can return home with the following  Direct supervision/assist for financial management;Assist for transportation;Direct supervision/assist for medications management;Assistance with cooking/housework;A lot of help with bathing/dressing/bathroom;A little help with walking and/or transfers   Equipment Recommendations  None recommended by OT       Precautions / Restrictions Precautions Precautions: Fall Restrictions Weight Bearing Restrictions: No       Mobility Bed Mobility Overal bed mobility: Needs Assistance Bed Mobility: Sit to Supine Rolling: Min assist              Transfers Overall transfer level: Needs assistance Equipment used: Rolling walker (2 wheels) Transfers: Sit to/from Stand, Bed to chair/wheelchair/BSC Sit to  Stand: Min assist Stand pivot transfers: Min assist         General transfer comment: Required cues for hand placment         ADL either performed or assessed with clinical judgement   ADL Overall ADL's : Needs assistance/impaired Eating/Feeding: Sitting;Set up   Grooming: Minimal assistance;Sitting Grooming Details (indicate cue type and reason): based on clinical judgement, at chair level         Upper Body Dressing : Minimal assistance;Sitting Upper Body Dressing Details (indicate cue type and reason): based on clinical judgement Lower Body Dressing: Maximal assistance   Toilet Transfer: Minimal assistance;Cueing for safety;Rolling walker (2 wheels);BSC/3in1;Stand-pivot Toilet Transfer Details (indicate cue type and reason): She required cues for walker positioning, to reach back prior to sitting, and to not sit prematurely. Toileting- Clothing Manipulation and Hygiene: Maximal assistance;Sit to/from stand;Cueing for safety Toileting - Clothing Manipulation Details (indicate cue type and reason): She was unable to safely perform hygiene in standing, given unsteadiness in standing and feelings of fatigue. As such, she required increased assist in this regard, as well as assistance for clothing management. Performed with the pt at bedside commode level.                       Cognition Arousal/Alertness: Awake/alert Behavior During Therapy: WFL for tasks assessed/performed Overall Cognitive Status: No family/caregiver present to determine baseline cognitive functioning Area of Impairment: Memory, Safety/judgement                Safety/Judgement: Decreased awareness of safety                         Pertinent Vitals/ Pain       Pain Assessment Pain  Assessment: Faces Pain Score: 4  Pain Location: L heel Pain Descriptors / Indicators: Sore, Aching Pain Intervention(s): Limited activity within patient's tolerance         Frequency  Min 1X/week         Progress Toward Goals  OT Goals(current goals can now be found in the care plan section)  Progress towards OT goals: Progressing toward goals  Acute Rehab OT Goals Patient Stated Goal: to return home OT Goal Formulation: With patient Time For Goal Achievement: 01/30/23 Potential to Achieve Goals: Good  Plan Discharge plan remains appropriate       AM-PAC OT "6 Clicks" Daily Activity     Outcome Measure   Help from another person eating meals?: None Help from another person taking care of personal grooming?: A Little Help from another person toileting, which includes using toliet, bedpan, or urinal?: A Lot Help from another person bathing (including washing, rinsing, drying)?: A Lot Help from another person to put on and taking off regular upper body clothing?: A Little Help from another person to put on and taking off regular lower body clothing?: A Lot 6 Click Score: 16    End of Session Equipment Utilized During Treatment: Rolling walker (2 wheels);Gait belt;Oxygen  OT Visit Diagnosis: Unsteadiness on feet (R26.81);Muscle weakness (generalized) (M62.81)   Activity Tolerance Patient limited by fatigue   Patient Left in bed;with call bell/phone within reach;with bed alarm set   Nurse Communication Mobility status        Time: 4098-1191 OT Time Calculation (min): 29 min  Charges: OT General Charges $OT Visit: 1 Visit OT Treatments $Self Care/Home Management : 8-22 mins $Therapeutic Activity: 8-22 mins     Reuben Likes, OTR/L 01/20/2023, 12:01 PM

## 2023-01-20 NOTE — Progress Notes (Signed)
The patients daughter expressed concerns regarding safety of care and transport home today. She stated that it is not possible to safely transport pt home today but will be able tomorrow.Provider updated.

## 2023-01-20 NOTE — Discharge Summary (Signed)
Physician Discharge Summary  [name] ZOX:[id number] DOB: 23-Aug-1928 DOA: 01/15/2023  PCP: Camie Patience, FNP  Admit date: 01/15/2023 Discharge date: 01/20/2023  Admitted From: Home Disposition: Home with home health  Recommendations for Outpatient Follow-up:  Follow up with PCP in 1-2 weeks Please obtain BMP/CBC/INR  Home Health: PT/OT/home health aide/social worker Equipment/Devices: Already present at home  Discharge Condition: Fair CODE STATUS: DNR Diet recommendation: Regular diet, nutritional supplements  Discharge summary: 89 year old female with chronic multiple medical issues, history significant for COPD and chronic hypoxemia on 2 L oxygen at home, paroxysmal atrial fibrillation on Coumadin, pulmonary embolism, hypothyroidism, severe aortic stenosis who presents from home with fever.  Reportedly she had temperature at home, Tylenol was given she did not respond so brought to the ER.  She recently finished cefdinir for UTI.  Patient was recently admitted to the hospital and went to a SNF and subsequently going home with her daughter.  Urinalysis was abnormal.  CT scan abdomen pelvis also showed uncomplicated left-sided colitis. Patient was admitted with acute UTI, left-sided diverticulitis.  Treated with IV antibiotics with clinical improvement.  Culture with Pseudomonas. Fairly debilitated and needing a lot of help, however went home with her daughter.     Acute UTI without hematuria, sepsis present on admission with presence of temperature 101, blood pressure 85/50, respiratory 33 and WBC count of 15.2. Blood cultures negative.  Urine culture with Pseudomonas. Currently on cefepime day 4/7. Due to adequate clinical improvement, patient will complete additional 3 days of Levaquin 375 mg daily adjusted to her kidney functions.   Left-sided diverticulitis: Minimal symptoms.  Tolerating regular diet.  Patient 5 days of antibiotics.  Levaquin to continue.  No bowel symptoms.    Chronic medical issues including Hypothyroidism, on Synthroid.  Continued. CKD stage IIIb, at about her baseline.  Monitor on treatment. COPD and chronic hypoxemia, chronic and stable. Multiple ulcers, not infected.  Continue wound care. Permanent atrial fibrillation, on Coumadin therapy.  Stable. Hypomagnesemia, replaced and improved.    Disposition: PT OT recommended SNF placement. Discussed with patient's daughter , she would take patient home.     Discharge Diagnoses:  Principal Problem:   Sepsis (HCC) Active Problems:   Chronic diastolic CHF (congestive heart failure) (HCC)   Hypothyroidism   Chronic kidney disease, stage 3b (HCC)   Chronic obstructive lung disease (HCC)   Uncontrolled hypertension   Permanent atrial fibrillation (HCC)   Personal history of pulmonary embolism   Anemia   Generalized weakness   Hypomagnesemia   Leg wound, left   Diverticulitis large intestine w/o perforation or abscess w/o bleeding   Pressure injury of skin   UTI (urinary tract infection)    Discharge Instructions  Discharge Instructions     Diet - low sodium heart healthy   Complete by: As directed    Discharge wound care:   Complete by: As directed    Left heel and sacrum , Wound care to left heel and sacral:  Cleanse with NS, pat dry. Cover with single layer of xeroform gauze [employer] [place] # [id number]), top with dry gauze and secure with silicone foam. Change xeroform daily, may reuse foam for up to 3 days.  Wound care to right heel pressure injury stage 3: Cleanse with NS, pat dry. Apply Medihoney as directed. Top with dry gauze, secure with Kerlix roll gauze/paper tape and place foot into [employer] Boot   Increase activity slowly   Complete by: As directed       Allergies  as of 01/20/2023       Reactions   Amoxicillin Other (See Comments)   Unknown reaction  Tolerates Keflex, Cefepime   Azithromycin Other (See Comments)   Unknown reaction    Codeine Other (See Comments)   Unknown  reaction    Erythromycin Other (See Comments)   Unknown reaction    Green Dyes Other (See Comments)   Allergic to ALL dyes   Iodine Other (See Comments)   Unknown reaction    Misc. Sulfonamide Containing Compounds    Oxycodone Other (See Comments)   Unknown reaction    Oxycodone-acetaminophen Other (See Comments)   Unknown reaction    Penicillins Other (See Comments)   Unknown reaction  Tolerates Keflex, Cefepime   Sulfasalazine    Tramadol Hives        Medication List     STOP taking these medications    bumetanide 0.5 MG tablet Commonly known as: BUMEX   carboxymethylcellulose 0.5 % Soln Commonly known as: REFRESH PLUS       TAKE these medications    acetaminophen 325 MG tablet Commonly known as: TYLENOL Take 2 tablets (650 mg total) by mouth every 6 (six) hours as needed for mild pain or headache. What changed: reasons to take this   cholecalciferol 25 MCG (1000 UNIT) tablet Commonly known as: VITAMIN D3 Take 1,000 Units by mouth daily.   cyanocobalamin 1000 MCG tablet Commonly known as: VITAMIN B12 Take 1,000 mcg by mouth daily.   diclofenac Sodium 1 % Gel Commonly known as: VOLTAREN Apply 4 g topically 4 (four) times daily. To bilateral feet and knees What changed:  when to take this reasons to take this   gabapentin 300 MG capsule Commonly known as: NEURONTIN Take 300 mg by mouth 2 (two) times daily.   levofloxacin 750 MG tablet Commonly known as: Levaquin Take 0.5 tablets (375 mg total) by mouth daily for 4 days.   levothyroxine 100 MCG tablet Commonly known as: SYNTHROID Take 100 mcg by mouth every morning.   pantoprazole 40 MG tablet Commonly known as: PROTONIX Take 40 mg by mouth daily.   polyethylene glycol powder 17 GM/SCOOP powder Commonly known as: MiraLax Take 17 g by mouth 2 (two) times daily as needed for moderate constipation.   PreserVision/Lutein Caps Take 1 capsule by mouth 2 (two) times daily.   senna-docusate  8.6-50 MG tablet Commonly known as: Senokot-S Take 1 tablet by mouth 2 (two) times daily between meals as needed for mild constipation.   spironolactone 25 MG tablet Commonly known as: ALDACTONE Take 0.5 tablets (12.5 mg total) by mouth daily.   warfarin 1 MG tablet Commonly known as: COUMADIN Take 1-2 mg by mouth as directed. Take 2 tablets (2 mg) (Monday thru Friday) & Take 1 tablet (1 mg) on (Saturday & Sunday)               Discharge Care Instructions  (From admission, onward)           Start     Ordered   01/20/23 0000  Discharge wound care:       Comments: Left heel and sacrum , Wound care to left heel and sacral:  Cleanse with NS, pat dry. Cover with single layer of xeroform gauze [employer] [place] # [id number]), top with dry gauze and secure with silicone foam. Change xeroform daily, may reuse foam for up to 3 days.  Wound care to right heel pressure injury stage 3: Cleanse with NS, pat dry. Apply Medihoney as  directed. Top with dry gauze, secure with Kerlix roll gauze/paper tape and place foot into [employer]   01/20/23 1149            Follow-up Information     [facility] Follow up.   Why: HH nursing/physical/occupational therapy/aide/social Advice worker information: [address] [place] [address] [phone]                Allergies  Allergen Reactions   Amoxicillin Other (See Comments)    Unknown reaction  Tolerates Keflex, Cefepime   Azithromycin Other (See Comments)    Unknown reaction    Codeine Other (See Comments)    Unknown reaction    Erythromycin Other (See Comments)    Unknown reaction    Green Dyes Other (See Comments)    Allergic to ALL dyes   Iodine Other (See Comments)    Unknown reaction    Misc. Sulfonamide Containing Compounds    Oxycodone Other (See Comments)    Unknown reaction      Oxycodone-Acetaminophen Other (See Comments)    Unknown reaction    Penicillins Other (See Comments)     Unknown reaction  Tolerates Keflex, Cefepime   Sulfasalazine    Tramadol Hives    Consultations: None   Procedures/Studies: CT ABDOMEN PELVIS WO CONTRAST  Result Date: 01/15/2023 CLINICAL DATA:  Complicated history with pyelonephritis suspected. EXAM: CT ABDOMEN AND PELVIS WITHOUT CONTRAST TECHNIQUE: Multidetector CT imaging of the abdomen and pelvis was performed following the standard protocol without IV contrast. RADIATION DOSE REDUCTION: This exam was performed according to the departmental dose-optimization program which includes automated exposure control, adjustment of the mA and/or kV according to patient size and/or use of iterative reconstruction technique. COMPARISON:  CT abdomen and pelvis 08/02/2020 FINDINGS: Lower chest: Trace bilateral pleural effusions peribronchial wall thickening with mucous plugging and patchy infiltrates in the lower lungs. Marked cardiomegaly. Hepatobiliary: Unremarkable noncontrast appearance of the liver. Unremarkable gallbladder and biliary tree. Pancreas: No acute abnormality. Spleen: Unremarkable. Adrenals/Urinary Tract: Stable adrenal glands. No urinary calculi. No hydronephrosis. Low-density cystic lesions compatible with benign cysts. No follow-up recommended. Mild bladder wall thickening may be due to underdistention or cystitis. Evaluation pyelonephritis is limited by lack of IV contrast. No perinephric stranding. Stomach/Bowel: Normal caliber large and small bowel. Colonic diverticulosis about the sigmoid and descending colon. There is wall thickening, adjacent stranding, and small amount of free fluid about the distal descending and sigmoid colon compatible with diverticulitis. No organized fluid collection. No free air. Normal appendix.  Stomach is within normal limits. Vascular/Lymphatic: Advanced aortic atherosclerotic calcification. No lymphadenopathy. Reproductive: Hysterectomy. Other: No abdominal wall hernia. Musculoskeletal: No acute osseous  abnormality. Advanced thoracolumbar spondylosis. Bilateral L5 pars defects with grade 1 spondylolisthesis. IMPRESSION: 1. Acute uncomplicated diverticulitis of the distal descending and sigmoid colon. 2. Mild bladder wall thickening may be due to underdistention or cystitis. Correlate with urinalysis. 3. Trace bilateral pleural effusions with bilateral lower lung infiltrates which may be due to edema given marked cardiomegaly though atypical infection is not excluded. 4. Peribronchial wall thickening and mucous plugging in the lower lungs. Aortic Atherosclerosis (ICD10-I70.0). Electronically Signed   By: Minerva Fester M.D.   On: 01/15/2023 23:04   DG Chest Port 1 View  Result Date: 01/15/2023 CLINICAL DATA:  Questionable sepsis, evaluate for abnormality. EXAM: PORTABLE CHEST 1 VIEW COMPARISON:  11/01/2022 FINDINGS: Stable cardiomegaly. Aortic atherosclerotic calcification. Chronic bronchitic changes and interstitial coarsening. Probable small bilateral pleural effusions. No pneumothorax. No definite  displaced rib fractures. Advanced arthritis both shoulders. IMPRESSION: 1. Cardiomegaly and interstitial coarsening suggestive of edema. Chronic bronchitic lung changes or viral infection could appear similarly. 2. Small bilateral pleural effusions. Electronically Signed   By: Minerva Fester M.D.   On: 01/15/2023 19:25   Yag Capsulotomy - OS - Left Eye  Result Date: 01/07/2023 Procedure note: YAG Capsulotomy, Left Eye Informed consent obtained. Pre-op dilating drops (1% Topicamide and 2.5% Phenylephrine), and topical anesthesia given. Power: 7.2 mJ Shots: 20 Posterior capsulotomy in cruciate formation performed without difficulty. Patient tolerated procedure well. No complications. Rx pred forte 4 times a day for 7 days, then stop. Pt received written and verbal post laser education. Recheck in 2 weeks w/ dilated exam.   OCT, Retina - OU - Both Eyes  Result Date: 01/07/2023 Right Eye Quality was borderline.  Central Foveal Thickness: 227. Progression has worsened. Findings include normal foveal contour, no IRF, no SRF, retinal drusen , outer retinal atrophy (Mild progression of perifoveal ORA/GA -- seen best on en face image). Left Eye Quality was borderline. Central Foveal Thickness: 333. Progression has worsened. Findings include normal foveal contour, no IRF, no SRF, retinal drusen , intraretinal hyper-reflective material, subretinal fluid, outer retinal atrophy (Mild progression of ORA/GA greatest superior and nasal mac -- seen best on en face image). Notes *Images captured and stored on drive Diagnosis / Impression: Nonexudative ARMD OU -- No IRF/SRF OU OD: Mild progression of perifoveal ORA/GA -- seen best on en face images OS: Mild progression of ORA/GA greatest superior and nasal mac -- seen best on en face images Clinical management: See below Abbreviations: NFP - Normal foveal profile. CME - cystoid macular edema. PED - pigment epithelial detachment. IRF - intraretinal fluid. SRF - subretinal fluid. EZ - ellipsoid zone. ERM - epiretinal membrane. ORA - outer retinal atrophy. ORT - outer retinal tubulation. SRHM - subretinal hyper-reflective material. IRHM - intraretinal hyper-reflective material   (Echo, Carotid, EGD, Colonoscopy, ERCP)    Subjective: Patient seen and examined.  No overnight events.  Sleepy today.  Some pain on her heel but denies any complaints.  No other events noted since yesterday.   Discharge Exam: Vitals:   01/20/23 0422 01/20/23 1219  BP: (!) 105/56 (!) 142/59  Pulse: 85 61  Resp: 19 20  Temp: 98.7 F (37.1 C) 97.8 F (36.6 C)  SpO2: 94% 100%   Vitals:   01/19/23 1317 01/19/23 2101 01/20/23 0422 01/20/23 1219  BP: (!) 142/60 (!) 160/68 (!) 105/56 (!) 142/59  Pulse: 80 74 85 61  Resp: 18 18 19 20   Temp: 97.8 F (36.6 C) 98 F (36.7 C) 98.7 F (37.1 C) 97.8 F (36.6 C)  TempSrc: Oral Oral Oral Oral  SpO2: 100% 98% 94% 100%  Weight:      Height:         General: Pt is alert, awake, not in acute distress Chronically sick looking.  Very frail and debilitated. Cardiovascular: RRR, S1/S2 +, no rubs, no gallops Respiratory: CTA bilaterally, no wheezing, no rhonchi Abdominal: Soft, NT, ND, bowel sounds + Extremities: no edema,  Multiple pressure ulceration along with a stage III pressure ulcer left heel as documented.    The results of significant diagnostics from this hospitalization (including imaging, microbiology, ancillary and laboratory) are listed below for reference.     Microbiology: Recent Results (from the past 240 hour(s))  Urine Culture     Status: Abnormal   Collection Time: 01/15/23  6:32 PM   Specimen: Urine,  Random  Result Value Ref Range Status   Specimen Description   Final    URINE, RANDOM Performed at [facility], [address] [place], [place] [address]    Special Requests   Final    NONE Reflexed from [id number] Performed at [facility], [address], [place], [place] [address]    Culture >=100,000 COLONIES/mL PSEUDOMONAS AERUGINOSA (A)  Final   Report Status 01/18/2023 FINAL  Final   Organism ID, Bacteria PSEUDOMONAS AERUGINOSA (A)  Final      Susceptibility   Pseudomonas aeruginosa - MIC*    CEFTAZIDIME 4 SENSITIVE Sensitive     CIPROFLOXACIN <=0.25 SENSITIVE Sensitive     GENTAMICIN 4 SENSITIVE Sensitive     IMIPENEM 2 SENSITIVE Sensitive     PIP/TAZO 8 SENSITIVE Sensitive     CEFEPIME 2 SENSITIVE Sensitive     * >=100,000 COLONIES/mL PSEUDOMONAS AERUGINOSA  Blood Culture (routine x 2)     Status: None   Collection Time: 01/15/23  6:41 PM   Specimen: BLOOD RIGHT ARM  Result Value Ref Range Status   Specimen Description   Final    BLOOD RIGHT ARM Performed at [facility], [address] [place], [place] [address]    Special Requests   Final    BOTTLES DRAWN AEROBIC AND ANAEROBIC Blood Culture adequate volume Performed at [facility], [address] [place], [place] [address]    Culture   Final    NO GROWTH 5 DAYS Performed at [facility], [address] [place], [place] [address]    Report Status 01/20/2023 FINAL  Final  Blood Culture (routine x 2)     Status: None   Collection Time: 01/15/23  6:43 PM   Specimen: BLOOD LEFT ARM  Result Value Ref Range Status   Specimen Description   Final    BLOOD LEFT ARM Performed at [facility], [address], [place], [place] [address]    Special Requests   Final    BOTTLES DRAWN AEROBIC AND ANAEROBIC Blood Culture adequate volume Performed at [facility], [address] [place], [place] [address]    Culture   Final    NO GROWTH 5 DAYS Performed at [facility], [address] [place], [place] [address]    Report Status 01/20/2023 FINAL  Final  Resp panel by RT-PCR (RSV, Flu A&B, Covid) Anterior Nasal Swab     Status: None   Collection Time: 01/15/23  7:02 PM   Specimen: Anterior Nasal Swab  Result Value Ref Range Status   SARS Coronavirus 2 by RT PCR NEGATIVE NEGATIVE Final    Comment: (NOTE) SARS-CoV-2 target nucleic acids are NOT DETECTED.  The SARS-CoV-2 RNA is generally detectable in upper respiratory specimens during the acute phase of infection. The lowest concentration of SARS-CoV-2 viral copies this assay can detect is 138 copies/mL. A negative result does not preclude SARS-Cov-2 infection and should not be used as the sole basis for treatment or other patient management decisions. A negative result may occur with  improper specimen collection/handling, submission of specimen other than nasopharyngeal swab, presence of viral mutation(s) within the areas targeted by this assay, and inadequate number of viral copies(<138 copies/mL). A negative result must be combined with clinical observations, patient history, and epidemiological information. The expected result is Negative.  Fact Sheet for Patients:   BloggerCourse.com  Fact Sheet for Healthcare Providers:  SeriousBroker.it  This  test is no t yet approved or cleared by the [place] and  has been authorized for detection and/or diagnosis of SARS-CoV-2 by FDA under an Emergency Use Authorization (EUA). This EUA will remain  in effect (meaning this test can be used) for the duration of the COVID-19 declaration under Section 564(b)(1) of the Act, 21 U.S.C.section 360bbb-3(b)(1), unless the authorization is terminated  or revoked sooner.       Influenza A by PCR NEGATIVE NEGATIVE Final   Influenza B by PCR NEGATIVE NEGATIVE Final    Comment: (NOTE) The Xpert Xpress SARS-CoV-2/FLU/RSV plus assay is intended as an aid in the diagnosis of influenza from Nasopharyngeal swab specimens and should not be used as a sole basis for treatment. Nasal washings and aspirates are unacceptable for Xpert Xpress SARS-CoV-2/FLU/RSV testing.  Fact Sheet for Patients: BloggerCourse.com  Fact Sheet for Healthcare Providers: SeriousBroker.it  This test is not yet approved or cleared by the [place] FDA and has been authorized for detection and/or diagnosis of SARS-CoV-2 by FDA under an Emergency Use Authorization (EUA). This EUA will remain in effect (meaning this test can be used) for the duration of the COVID-19 declaration under Section 564(b)(1) of the Act, 21 U.S.C. section 360bbb-3(b)(1), unless the authorization is terminated or revoked.     Resp Syncytial Virus by PCR NEGATIVE NEGATIVE Final    Comment: (NOTE) Fact Sheet for Patients: BloggerCourse.com  Fact Sheet for Healthcare Providers: SeriousBroker.it  This test is not yet approved or cleared by the [place] FDA and has been authorized for detection and/or diagnosis of SARS-CoV-2 by FDA under an Emergency Use  Authorization (EUA). This EUA will remain in effect (meaning this test can be used) for the duration of the COVID-19 declaration under Section 564(b)(1) of the Act, 21 U.S.C. section 360bbb-3(b)(1), unless the authorization is terminated or revoked.  Performed at [facility], [address], [place], [place] [address]      Labs: BNP (last 3 results) Recent Labs    10/23/22 2152 11/01/22 0720  BNP 373.1* 329.2*   Basic Metabolic Panel: Recent Labs  Lab 01/15/23 1842 01/16/23 0526 01/17/23 0505  NA 133* 131* 130*  K 3.5 3.5 3.7  CL 95* 95* 94*  CO2 28 28 28   GLUCOSE 98 92 94  BUN 15 16 15   CREATININE 1.16* 1.25* 1.09*  CALCIUM 9.1 8.5* 8.9  MG 1.6* 2.1  --   PHOS 2.7 3.0  --    Liver Function Tests: Recent Labs  Lab 01/15/23 1842 01/16/23 0526  AST 30 43*  ALT 14 19  ALKPHOS 131* 107  BILITOT 1.4* 1.3*  PROT 6.4* 5.3*  ALBUMIN 2.8* 2.3*   No results for input(s): "LIPASE", "AMYLASE" in the last 168 hours. No results for input(s): "AMMONIA" in the last 168 hours. CBC: Recent Labs  Lab 01/15/23 1842 01/16/23 0526 01/17/23 0505  WBC 15.2* 10.4 7.9  NEUTROABS 13.0* 8.2*  --   HGB 10.4* 8.9* 9.0*  HCT 33.4* 28.2* 28.9*  MCV 96.0 96.2 95.7  PLT 189 144* 143*   Cardiac Enzymes: Recent Labs  Lab 01/15/23 1842  CKTOTAL 34*   BNP: Invalid input(s): "POCBNP" CBG: Recent Labs  Lab 01/19/23 1202  GLUCAP 90   D-Dimer No results for input(s): "DDIMER" in the last 72 hours. Hgb A1c No results for input(s): "HGBA1C" in the last 72 hours. Lipid Profile No results for input(s): "CHOL", "HDL", "LDLCALC", "TRIG", "CHOLHDL", "LDLDIRECT" in the last 72 hours. Thyroid function  studies No results for input(s): "TSH", "T4TOTAL", "T3FREE", "THYROIDAB" in the last 72 hours.  Invalid input(s): "FREET3" Anemia work up No results for input(s): "VITAMINB12", "FOLATE", "FERRITIN", "TIBC", "IRON", "RETICCTPCT" in the last 72 hours. Urinalysis     Component Value Date/Time   COLORURINE YELLOW 01/15/2023 1832   APPEARANCEUR CLOUDY (A) 01/15/2023 1832   LABSPEC 1.010 01/15/2023 1832   PHURINE 6.0 01/15/2023 1832   GLUCOSEU NEGATIVE 01/15/2023 1832   HGBUR SMALL (A) 01/15/2023 1832   BILIRUBINUR NEGATIVE 01/15/2023 1832   KETONESUR NEGATIVE 01/15/2023 1832   PROTEINUR NEGATIVE 01/15/2023 1832   NITRITE POSITIVE (A) 01/15/2023 1832   LEUKOCYTESUR LARGE (A) 01/15/2023 1832   Sepsis Labs Recent Labs  Lab 01/15/23 1842 01/16/23 0526 01/17/23 0505  WBC 15.2* 10.4 7.9   Microbiology Recent Results (from the past 240 hour(s))  Urine Culture     Status: Abnormal   Collection Time: 01/15/23  6:32 PM   Specimen: Urine, Random  Result Value Ref Range Status   Specimen Description   Final    URINE, RANDOM Performed at [facility], [address], [place], [place] [address]    Special Requests   Final    NONE Reflexed from [id number] Performed at [facility], [address], [place], [place] [address]    Culture >=100,000 COLONIES/mL PSEUDOMONAS AERUGINOSA (A)  Final   Report Status 01/18/2023 FINAL  Final   Organism ID, Bacteria PSEUDOMONAS AERUGINOSA (A)  Final      Susceptibility   Pseudomonas aeruginosa - MIC*    CEFTAZIDIME 4 SENSITIVE Sensitive     CIPROFLOXACIN <=0.25 SENSITIVE Sensitive     GENTAMICIN 4 SENSITIVE Sensitive     IMIPENEM 2 SENSITIVE Sensitive     PIP/TAZO 8 SENSITIVE Sensitive     CEFEPIME 2 SENSITIVE Sensitive     * >=100,000 COLONIES/mL PSEUDOMONAS AERUGINOSA  Blood Culture (routine x 2)     Status: None   Collection Time: 01/15/23  6:41 PM   Specimen: BLOOD RIGHT ARM  Result Value Ref Range Status   Specimen Description   Final    BLOOD RIGHT ARM Performed at [facility], [address], [place], [place] [address]    Special Requests   Final    BOTTLES DRAWN AEROBIC AND ANAEROBIC Blood Culture adequate volume Performed at [facility], [address] [place], [place] [address]    Culture   Final    NO GROWTH 5 DAYS Performed at [facility], [address], [place], [place] [address]    Report Status 01/20/2023 FINAL  Final  Blood Culture (routine x 2)     Status: None   Collection Time: 01/15/23  6:43 PM   Specimen: BLOOD LEFT ARM  Result Value Ref Range Status   Specimen Description   Final    BLOOD LEFT ARM Performed at [facility], [address], [place], [place] [address]    Special Requests   Final    BOTTLES DRAWN AEROBIC AND ANAEROBIC Blood Culture adequate volume Performed at [facility], [address], [place], [place] [address]    Culture   Final    NO GROWTH 5 DAYS Performed at [facility], [address] [place], [place] [address]    Report Status 01/20/2023 FINAL  Final  Resp panel by RT-PCR (RSV, Flu A&B, Covid) Anterior Nasal Swab     Status: None   Collection Time: 01/15/23  7:02 PM   Specimen: Anterior Nasal Swab  Result Value Ref Range Status   SARS Coronavirus 2 by RT PCR NEGATIVE NEGATIVE Final    Comment: (NOTE) SARS-CoV-2 target nucleic acids are NOT DETECTED.  The SARS-CoV-2 RNA is generally detectable in upper respiratory specimens during the acute phase of infection. The lowest concentration of SARS-CoV-2 viral copies this assay can detect is 138 copies/mL. A negative result does not preclude SARS-Cov-2 infection and should not be used as the sole basis for treatment or other patient management decisions. A negative result may occur with  improper specimen collection/handling, submission of specimen other than nasopharyngeal swab, presence of viral mutation(s) within the areas targeted by this assay, and inadequate number of viral copies(<138 copies/mL). A negative result must be combined with clinical observations, patient history, and epidemiological information. The expected result is Negative.  Fact Sheet for Patients:   BloggerCourse.com  Fact Sheet for Healthcare Providers:  SeriousBroker.it  This test is no t yet approved or cleared by the [place] FDA and  has been authorized for detection and/or diagnosis of SARS-CoV-2 by FDA under an Emergency Use Authorization (EUA). This EUA will remain  in effect (meaning this test can be used) for the duration of the COVID-19 declaration under Section 564(b)(1) of the Act, 21 U.S.C.section 360bbb-3(b)(1), unless the authorization is terminated  or revoked sooner.       Influenza A by PCR NEGATIVE NEGATIVE Final   Influenza B by PCR NEGATIVE NEGATIVE Final    Comment: (NOTE) The Xpert Xpress SARS-CoV-2/FLU/RSV plus assay is intended as an aid in the diagnosis of influenza from Nasopharyngeal swab specimens and should not be used as a sole basis for treatment. Nasal washings and aspirates are unacceptable for Xpert Xpress SARS-CoV-2/FLU/RSV testing.  Fact Sheet for Patients: BloggerCourse.com  Fact Sheet for Healthcare Providers: SeriousBroker.it  This test is not yet approved or cleared by the [place] FDA and has been authorized for detection and/or diagnosis of SARS-CoV-2 by FDA under an Emergency Use Authorization (EUA). This EUA will remain in effect (meaning this test can be used) for the duration of the COVID-19 declaration under Section 564(b)(1) of the Act, 21 U.S.C. section 360bbb-3(b)(1), unless the authorization is terminated or revoked.     Resp Syncytial Virus by PCR NEGATIVE NEGATIVE Final    Comment: (NOTE) Fact Sheet for Patients: BloggerCourse.com  Fact Sheet for Healthcare Providers: SeriousBroker.it  This test is not yet approved or cleared by the [place] FDA and has been authorized for detection and/or diagnosis of SARS-CoV-2 by FDA under an Emergency Use  Authorization (EUA). This EUA will remain in effect (meaning this test can be used) for the duration of the COVID-19 declaration under Section 564(b)(1) of the Act, 21 U.S.C. section 360bbb-3(b)(1), unless the authorization is terminated or revoked.  Performed at [facility], [address] [place], [place] [address]      Time coordinating discharge: 35 minutes  SIGNED:   Dorcas Carrow, MD  [facility] 01/20/2023, 1:11 PM

## 2023-01-21 ENCOUNTER — Encounter (INDEPENDENT_AMBULATORY_CARE_PROVIDER_SITE_OTHER): Payer: Medicare HMO | Admitting: Ophthalmology

## 2023-01-21 DIAGNOSIS — I10 Essential (primary) hypertension: Secondary | ICD-10-CM

## 2023-01-21 DIAGNOSIS — H26492 Other secondary cataract, left eye: Secondary | ICD-10-CM

## 2023-01-21 DIAGNOSIS — H35033 Hypertensive retinopathy, bilateral: Secondary | ICD-10-CM

## 2023-01-21 DIAGNOSIS — N3000 Acute cystitis without hematuria: Secondary | ICD-10-CM | POA: Diagnosis not present

## 2023-01-21 DIAGNOSIS — H353133 Nonexudative age-related macular degeneration, bilateral, advanced atrophic without subfoveal involvement: Secondary | ICD-10-CM

## 2023-01-21 DIAGNOSIS — A419 Sepsis, unspecified organism: Secondary | ICD-10-CM | POA: Diagnosis not present

## 2023-01-21 DIAGNOSIS — Z961 Presence of intraocular lens: Secondary | ICD-10-CM

## 2023-01-21 DIAGNOSIS — H04123 Dry eye syndrome of bilateral lacrimal glands: Secondary | ICD-10-CM

## 2023-01-21 LAB — CBC
HCT: 31 % — ABNORMAL LOW (ref 36.0–46.0)
Hemoglobin: 9.7 g/dL — ABNORMAL LOW (ref 12.0–15.0)
MCH: 30.2 pg (ref 26.0–34.0)
MCHC: 31.3 g/dL (ref 30.0–36.0)
MCV: 96.6 fL (ref 80.0–100.0)
Platelets: 194 10*3/uL (ref 150–400)
RBC: 3.21 MIL/uL — ABNORMAL LOW (ref 3.87–5.11)
RDW: 16.9 % — ABNORMAL HIGH (ref 11.5–15.5)
WBC: 5 10*3/uL (ref 4.0–10.5)
nRBC: 0 % (ref 0.0–0.2)

## 2023-01-21 LAB — PROTIME-INR
INR: 3.1 — ABNORMAL HIGH (ref 0.8–1.2)
Prothrombin Time: 32.1 seconds — ABNORMAL HIGH (ref 11.4–15.2)

## 2023-01-21 LAB — CREATININE, SERUM
Creatinine, Ser: 0.94 mg/dL (ref 0.44–1.00)
GFR, Estimated: 57 mL/min — ABNORMAL LOW (ref >60)

## 2023-01-21 MED ORDER — LEVOFLOXACIN 500 MG PO TABS
750.0000 mg | ORAL_TABLET | ORAL | Status: DC
  Administered 2023-01-21: 750 mg via ORAL
  Filled 2023-01-21: qty 2

## 2023-01-21 MED ORDER — HYDRALAZINE HCL 20 MG/ML IJ SOLN: 10.0000 mg | Freq: Once | INTRAMUSCULAR | Status: DC

## 2023-01-21 NOTE — Discharge Summary (Signed)
Physician Discharge Summary   Patient: [name] MRN: [id number] DOB: 08-25-1928  Admit date:     01/15/2023  Discharge date: 01/21/23  Discharge Physician: Rickey Barbara   PCP: Camie Patience, FNP   Recommendations at discharge:   Follow up with PCP in 1-2 weeks   Discharge Diagnoses: Principal Problem:   Sepsis (HCC) Active Problems:   Chronic diastolic CHF (congestive heart failure) (HCC)   Hypothyroidism   Chronic kidney disease, stage 3b (HCC)   Chronic obstructive lung disease (HCC)   Uncontrolled hypertension   Permanent atrial fibrillation (HCC)   Personal history of pulmonary embolism   Anemia   Generalized weakness   Hypomagnesemia   Leg wound, left   Diverticulitis large intestine w/o perforation or abscess w/o bleeding   Pressure injury of skin   UTI (urinary tract infection)  Resolved Problems:   * No resolved hospital problems. *  Hospital Course: 89 year old female with chronic multiple medical issues, history significant for COPD and chronic hypoxemia on 2 L oxygen at home, paroxysmal atrial fibrillation on Coumadin, pulmonary embolism, hypothyroidism, severe aortic stenosis who presents from home with fever.  Reportedly she had temperature at home, Tylenol was given she did not respond so brought to the ER.  She recently finished cefdinir for UTI.  Patient was recently admitted to the hospital and went to a SNF and subsequently going home with her daughter.  Urinalysis was abnormal.  CT scan abdomen pelvis also showed uncomplicated left-sided colitis. Patient was admitted with acute UTI, left-sided diverticulitis.   Assessment and Plan: Acute UTI without hematuria, sepsis present on admission with presence of temperature 101, blood pressure 85/50, respiratory 33 and WBC count of 15.2. Blood cultures negative.  Urine culture with Pseudomonas. -Pt was treated with 4 days of cefepime IV this visit -Clinically improved. Abx transitioned to levaquin 750mg  PO at  time of dc x 1 dose which would allow pt to complete total 6 days of abx   Left-sided diverticulitis: Minimal symptoms.  Tolerating regular diet. Pt completed abx as per above   Hypothyroidism -Pt was continued on Synthroid.  CKD stage IIIb -renal function remained stable -Initially concerns of dehydration, thus diuretics were held -Resume diuretics on d/c  COPD and chronic hypoxemia -remained on minimal o2 support  Stage 3 PI to right heel, left heel and sacrum with DTPI, present on admit -WOC was consulted this visit  Permanent atrial fibrillatio -Rate controlled -Pt to continue coumadin per home dosing -Pt to follow up with coumadin clinic  Hypomagnesemia, -replaced   Disposition: PT OT recommended SNF placement -Family had declined SNF and instead opted to have pt return home      Consultants: Wound ostomy RN Procedures performed:   Disposition: Home Diet recommendation:  Discharge Diet Orders (From admission, onward)     Start     Ordered   01/20/23 0000  Diet - low sodium heart healthy        01/20/23 1149           Cardiac diet DISCHARGE MEDICATION: Allergies as of 01/21/2023       Reactions   Amoxicillin Other (See Comments)   Unknown reaction  Tolerates Keflex, Cefepime   Azithromycin Other (See Comments)   Unknown reaction    Codeine Other (See Comments)   Unknown reaction    Erythromycin Other (See Comments)   Unknown reaction    Green Dyes Other (See Comments)   Allergic to ALL dyes   Iodine Other (See  Comments)   Unknown reaction    Misc. Sulfonamide Containing Compounds    Oxycodone Other (See Comments)   Unknown reaction    Oxycodone-acetaminophen Other (See Comments)   Unknown reaction    Penicillins Other (See Comments)   Unknown reaction  Tolerates Keflex, Cefepime   Sulfasalazine    Tramadol Hives        Medication List     TAKE these medications    acetaminophen 325 MG tablet Commonly known as: TYLENOL Take 2  tablets (650 mg total) by mouth every 6 (six) hours as needed for mild pain or headache. What changed: reasons to take this   bumetanide 0.5 MG tablet Commonly known as: BUMEX Take 0.5 mg by mouth 2 (two) times daily.   carboxymethylcellulose 0.5 % Soln Commonly known as: REFRESH PLUS Place 1 drop into both eyes 2 (two) times daily.   cholecalciferol 25 MCG (1000 UNIT) tablet Commonly known as: VITAMIN D3 Take 1,000 Units by mouth daily.   cyanocobalamin 1000 MCG tablet Commonly known as: VITAMIN B12 Take 1,000 mcg by mouth daily.   diclofenac Sodium 1 % Gel Commonly known as: VOLTAREN Apply 4 g topically 4 (four) times daily. To bilateral feet and knees What changed:  when to take this reasons to take this   gabapentin 300 MG capsule Commonly known as: NEURONTIN Take 300 mg by mouth 2 (two) times daily.   levothyroxine 100 MCG tablet Commonly known as: SYNTHROID Take 100 mcg by mouth every morning.   pantoprazole 40 MG tablet Commonly known as: PROTONIX Take 40 mg by mouth daily.   polyethylene glycol powder 17 GM/SCOOP powder Commonly known as: MiraLax Take 17 g by mouth 2 (two) times daily as needed for moderate constipation.   PreserVision/Lutein Caps Take 1 capsule by mouth 2 (two) times daily.   senna-docusate 8.6-50 MG tablet Commonly known as: Senokot-S Take 1 tablet by mouth 2 (two) times daily between meals as needed for mild constipation.   spironolactone 25 MG tablet Commonly known as: ALDACTONE Take 0.5 tablets (12.5 mg total) by mouth daily.   warfarin 1 MG tablet Commonly known as: COUMADIN Take 1-2 mg by mouth as directed. Take 2 tablets (2 mg) (Monday thru Friday) & Take 1 tablet (1 mg) on (Saturday & Sunday)               Discharge Care Instructions  (From admission, onward)           Start     Ordered   01/20/23 0000  Discharge wound care:       Comments: Left heel and sacrum , Wound care to left heel and sacral:  Cleanse  with NS, pat dry. Cover with single layer of xeroform gauze [employer] # [id number]), top with dry gauze and secure with silicone foam. Change xeroform daily, may reuse foam for up to 3 days.  Wound care to right heel pressure injury stage 3: Cleanse with NS, pat dry. Apply Medihoney as directed. Top with dry gauze, secure with Kerlix roll gauze/paper tape and place foot into [employer]   01/20/23 1149            Follow-up Information     [facility] Follow up.   Why: HH nursing/physical/occupational therapy/aide/social Advice worker information: [address] [place] [address] [phone]                Discharge Exam: Ceasar Mons Weights   01/15/23 1826  Weight: 72.3 kg  General exam: Awake, laying in bed, in nad Respiratory system: Normal respiratory effort, no wheezing Cardiovascular system: regular rate, s1, s2 Gastrointestinal system: Soft, nondistended, positive BS Central nervous system: CN2-12 grossly intact, strength intact Extremities: Perfused, no clubbing Skin: Normal skin turgor, no notable skin lesions seen Psychiatry: Mood normal // no visual hallucinations   Condition at discharge: fair  The results of significant diagnostics from this hospitalization (including imaging, microbiology, ancillary and laboratory) are listed below for reference.   Imaging Studies: CT ABDOMEN PELVIS WO CONTRAST  Result Date: 01/15/2023 CLINICAL DATA:  Complicated history with pyelonephritis suspected. EXAM: CT ABDOMEN AND PELVIS WITHOUT CONTRAST TECHNIQUE: Multidetector CT imaging of the abdomen and pelvis was performed following the standard protocol without IV contrast. RADIATION DOSE REDUCTION: This exam was performed according to the departmental dose-optimization program which includes automated exposure control, adjustment of the mA and/or kV according to patient size and/or use of iterative reconstruction technique. COMPARISON:  CT abdomen and  pelvis 08/02/2020 FINDINGS: Lower chest: Trace bilateral pleural effusions peribronchial wall thickening with mucous plugging and patchy infiltrates in the lower lungs. Marked cardiomegaly. Hepatobiliary: Unremarkable noncontrast appearance of the liver. Unremarkable gallbladder and biliary tree. Pancreas: No acute abnormality. Spleen: Unremarkable. Adrenals/Urinary Tract: Stable adrenal glands. No urinary calculi. No hydronephrosis. Low-density cystic lesions compatible with benign cysts. No follow-up recommended. Mild bladder wall thickening may be due to underdistention or cystitis. Evaluation pyelonephritis is limited by lack of IV contrast. No perinephric stranding. Stomach/Bowel: Normal caliber large and small bowel. Colonic diverticulosis about the sigmoid and descending colon. There is wall thickening, adjacent stranding, and small amount of free fluid about the distal descending and sigmoid colon compatible with diverticulitis. No organized fluid collection. No free air. Normal appendix.  Stomach is within normal limits. Vascular/Lymphatic: Advanced aortic atherosclerotic calcification. No lymphadenopathy. Reproductive: Hysterectomy. Other: No abdominal wall hernia. Musculoskeletal: No acute osseous abnormality. Advanced thoracolumbar spondylosis. Bilateral L5 pars defects with grade 1 spondylolisthesis. IMPRESSION: 1. Acute uncomplicated diverticulitis of the distal descending and sigmoid colon. 2. Mild bladder wall thickening may be due to underdistention or cystitis. Correlate with urinalysis. 3. Trace bilateral pleural effusions with bilateral lower lung infiltrates which may be due to edema given marked cardiomegaly though atypical infection is not excluded. 4. Peribronchial wall thickening and mucous plugging in the lower lungs. Aortic Atherosclerosis (ICD10-I70.0). Electronically Signed   By: Minerva Fester M.D.   On: 01/15/2023 23:04   DG Chest Port 1 View  Result Date: 01/15/2023 CLINICAL DATA:   Questionable sepsis, evaluate for abnormality. EXAM: PORTABLE CHEST 1 VIEW COMPARISON:  11/01/2022 FINDINGS: Stable cardiomegaly. Aortic atherosclerotic calcification. Chronic bronchitic changes and interstitial coarsening. Probable small bilateral pleural effusions. No pneumothorax. No definite displaced rib fractures. Advanced arthritis both shoulders. IMPRESSION: 1. Cardiomegaly and interstitial coarsening suggestive of edema. Chronic bronchitic lung changes or viral infection could appear similarly. 2. Small bilateral pleural effusions. Electronically Signed   By: Minerva Fester M.D.   On: 01/15/2023 19:25   Yag Capsulotomy - OS - Left Eye  Result Date: 01/07/2023 Procedure note: YAG Capsulotomy, Left Eye Informed consent obtained. Pre-op dilating drops (1% Topicamide and 2.5% Phenylephrine), and topical anesthesia given. Power: 7.2 mJ Shots: 20 Posterior capsulotomy in cruciate formation performed without difficulty. Patient tolerated procedure well. No complications. Rx pred forte 4 times a day for 7 days, then stop. Pt received written and verbal post laser education. Recheck in 2 weeks w/ dilated exam.   OCT, Retina - OU - Both Eyes  Result Date:  01/07/2023 Right Eye Quality was borderline. Central Foveal Thickness: 227. Progression has worsened. Findings include normal foveal contour, no IRF, no SRF, retinal drusen , outer retinal atrophy (Mild progression of perifoveal ORA/GA -- seen best on en face image). Left Eye Quality was borderline. Central Foveal Thickness: 333. Progression has worsened. Findings include normal foveal contour, no IRF, no SRF, retinal drusen , intraretinal hyper-reflective material, subretinal fluid, outer retinal atrophy (Mild progression of ORA/GA greatest superior and nasal mac -- seen best on en face image). Notes *Images captured and stored on drive Diagnosis / Impression: Nonexudative ARMD OU -- No IRF/SRF OU OD: Mild progression of perifoveal ORA/GA -- seen best on en  face images OS: Mild progression of ORA/GA greatest superior and nasal mac -- seen best on en face images Clinical management: See below Abbreviations: NFP - Normal foveal profile. CME - cystoid macular edema. PED - pigment epithelial detachment. IRF - intraretinal fluid. SRF - subretinal fluid. EZ - ellipsoid zone. ERM - epiretinal membrane. ORA - outer retinal atrophy. ORT - outer retinal tubulation. SRHM - subretinal hyper-reflective material. IRHM - intraretinal hyper-reflective material    Microbiology: Results for orders placed or performed during the hospital encounter of 01/15/23  Urine Culture     Status: Abnormal   Collection Time: 01/15/23  6:32 PM   Specimen: Urine, Random  Result Value Ref Range Status   Specimen Description   Final    URINE, RANDOM Performed at [facility], [address] [place], [place] [address]    Special Requests   Final    NONE Reflexed from [id number] Performed at [facility], [address] [place], [place] [address]    Culture >=100,000 COLONIES/mL PSEUDOMONAS AERUGINOSA (A)  Final   Report Status 01/18/2023 FINAL  Final   Organism ID, Bacteria PSEUDOMONAS AERUGINOSA (A)  Final      Susceptibility   Pseudomonas aeruginosa - MIC*    CEFTAZIDIME 4 SENSITIVE Sensitive     CIPROFLOXACIN <=0.25 SENSITIVE Sensitive     GENTAMICIN 4 SENSITIVE Sensitive     IMIPENEM 2 SENSITIVE Sensitive     PIP/TAZO 8 SENSITIVE Sensitive     CEFEPIME 2 SENSITIVE Sensitive     * >=100,000 COLONIES/mL PSEUDOMONAS AERUGINOSA  Blood Culture (routine x 2)     Status: None   Collection Time: 01/15/23  6:41 PM   Specimen: BLOOD RIGHT ARM  Result Value Ref Range Status   Specimen Description   Final    BLOOD RIGHT ARM Performed at [facility], [address], [place], [place] [address]    Special Requests   Final    BOTTLES DRAWN AEROBIC AND ANAEROBIC Blood Culture adequate volume Performed at [facility],  [address], [place], [place] [address]    Culture   Final    NO GROWTH 5 DAYS Performed at [facility], [address], [place], [place] [address]    Report Status 01/20/2023 FINAL  Final  Blood Culture (routine x 2)     Status: None   Collection Time: 01/15/23  6:43 PM   Specimen: BLOOD LEFT ARM  Result Value Ref Range Status   Specimen Description   Final    BLOOD LEFT ARM Performed at [facility], [address], [place], [place] [address]    Special Requests   Final    BOTTLES DRAWN AEROBIC AND ANAEROBIC Blood Culture adequate volume Performed at [facility], [address] [place], [place] [address]    Culture   Final    NO GROWTH 5 DAYS Performed at [facility], [address] [place], [place] [address]    Report Status 01/20/2023 FINAL  Final  Resp panel by RT-PCR (RSV, Flu A&B, Covid) Anterior Nasal Swab     Status: None   Collection Time: 01/15/23  7:02 PM   Specimen: Anterior Nasal Swab  Result Value Ref Range Status   SARS Coronavirus 2 by RT PCR NEGATIVE NEGATIVE Final    Comment: (NOTE) SARS-CoV-2 target nucleic acids are NOT DETECTED.  The SARS-CoV-2 RNA is generally detectable in upper respiratory specimens during the acute phase of infection. The lowest concentration of SARS-CoV-2 viral copies this assay can detect is 138 copies/mL. A negative result does not preclude SARS-Cov-2 infection and should not be used as the sole basis for treatment or other patient management decisions. A negative result may occur with  improper specimen collection/handling, submission of specimen other than nasopharyngeal swab, presence of viral mutation(s) within the areas targeted by this assay, and inadequate number of viral copies(<138 copies/mL). A negative result must be combined with clinical observations, patient history, and epidemiological information. The expected result is Negative.  Fact Sheet for Patients:   BloggerCourse.com  Fact Sheet for Healthcare Providers:  SeriousBroker.it  This test is no t yet approved or cleared by the [place] FDA and  has been authorized for detection and/or diagnosis of SARS-CoV-2 by FDA under an Emergency Use Authorization (EUA). This EUA will remain  in effect (meaning this test can be used) for the duration of the COVID-19 declaration under Section 564(b)(1) of the Act, 21 U.S.C.section 360bbb-3(b)(1), unless the authorization is terminated  or revoked sooner.       Influenza A by PCR NEGATIVE NEGATIVE Final   Influenza B by PCR NEGATIVE NEGATIVE Final    Comment: (NOTE) The Xpert Xpress SARS-CoV-2/FLU/RSV plus assay is intended as an aid in the diagnosis of influenza from Nasopharyngeal swab specimens and should not be used as a sole basis for treatment. Nasal washings and aspirates are unacceptable for Xpert Xpress SARS-CoV-2/FLU/RSV testing.  Fact Sheet for Patients: BloggerCourse.com  Fact Sheet for Healthcare Providers: SeriousBroker.it  This test is not yet approved or cleared by the [place] FDA and has been authorized for detection and/or diagnosis of SARS-CoV-2 by FDA under an Emergency Use Authorization (EUA). This EUA will remain in effect (meaning this test can be used) for the duration of the COVID-19 declaration under Section 564(b)(1) of the Act, 21 U.S.C. section 360bbb-3(b)(1), unless the authorization is terminated or revoked.     Resp Syncytial Virus by PCR NEGATIVE NEGATIVE Final    Comment: (NOTE) Fact Sheet for Patients: BloggerCourse.com  Fact Sheet for Healthcare Providers: SeriousBroker.it  This test is not yet approved or cleared by the [place] FDA and has been authorized for detection and/or diagnosis of SARS-CoV-2 by FDA under an Emergency Use  Authorization (EUA). This EUA will remain in effect (meaning this test can be used) for the duration of the COVID-19 declaration under Section 564(b)(1) of the Act, 21 U.S.C. section 360bbb-3(b)(1), unless the authorization is terminated or revoked.  Performed at [facility], [address], [place], [place] [address]     Labs: CBC: Recent Labs  Lab 01/15/23 1842 01/16/23 0526 01/17/23 0505 01/21/23 0447  WBC 15.2* 10.4 7.9 5.0  NEUTROABS 13.0* 8.2*  --   --   HGB 10.4* 8.9* 9.0*  9.7*  HCT 33.4* 28.2* 28.9* 31.0*  MCV 96.0 96.2 95.7 96.6  PLT 189 144* 143* 194   Basic Metabolic Panel: Recent Labs  Lab 01/15/23 1842 01/16/23 0526 01/17/23 0505 01/21/23 0447  NA 133* 131* 130*  --   K 3.5 3.5 3.7  --   CL 95* 95* 94*  --   CO2 28 28 28   --   GLUCOSE 98 92 94  --   BUN 15 16 15   --   CREATININE 1.16* 1.25* 1.09* 0.94  CALCIUM 9.1 8.5* 8.9  --   MG 1.6* 2.1  --   --   PHOS 2.7 3.0  --   --    Liver Function Tests: Recent Labs  Lab 01/15/23 1842 01/16/23 0526  AST 30 43*  ALT 14 19  ALKPHOS 131* 107  BILITOT 1.4* 1.3*  PROT 6.4* 5.3*  ALBUMIN 2.8* 2.3*   CBG: Recent Labs  Lab 01/19/23 1202  GLUCAP 90    Discharge time spent: less than 30 minutes.  Signed: Rickey Barbara, MD [facility] 01/21/2023

## 2023-01-21 NOTE — Hospital Course (Signed)
89 year old female with chronic multiple medical issues, history significant for COPD and chronic hypoxemia on 2 L oxygen at home, paroxysmal atrial fibrillation on Coumadin, pulmonary embolism, hypothyroidism, severe aortic stenosis who presents from home with fever.  Reportedly she had temperature at home, Tylenol was given she did not respond so brought to the ER.  She recently finished cefdinir for UTI.  Patient was recently admitted to the hospital and went to a SNF and subsequently going home with her daughter.  Urinalysis was abnormal.  CT scan abdomen pelvis also showed uncomplicated left-sided colitis. Patient was admitted with acute UTI, left-sided diverticulitis.

## 2023-01-21 NOTE — Plan of Care (Signed)
  Problem: Clinical Measurements: Goal: Diagnostic test results will improve Outcome: Progressing   Problem: Education: Goal: Knowledge of General Education information will improve Description: Including pain rating scale, medication(s)/side effects and non-pharmacologic comfort measures Outcome: Progressing   Problem: Clinical Measurements: Goal: Ability to maintain clinical measurements within normal limits will improve Outcome: Progressing   Problem: Activity: Goal: Risk for activity intolerance will decrease Outcome: Progressing

## 2023-01-21 NOTE — Progress Notes (Signed)
AVS and discharge instructions reviewed w/ patient. Patient's daughter and granddaughter at the bedside and had no further questions. Everyone verbalized understanding and  had no further questions. Daughter is patient's transportation to home.

## 2023-01-21 NOTE — Progress Notes (Signed)
ANTICOAGULATION CONSULT NOTE  Pharmacy Consult for Warfarin Indication: atrial fibrillation, history of PE  Allergies  Allergen Reactions   Amoxicillin Other (See Comments)    Unknown reaction  Tolerates Keflex, Cefepime   Azithromycin Other (See Comments)    Unknown reaction    Codeine Other (See Comments)    Unknown reaction    Erythromycin Other (See Comments)    Unknown reaction    Green Dyes Other (See Comments)    Allergic to ALL dyes   Iodine Other (See Comments)    Unknown reaction    Misc. Sulfonamide Containing Compounds    Oxycodone Other (See Comments)    Unknown reaction      Oxycodone-Acetaminophen Other (See Comments)    Unknown reaction    Penicillins Other (See Comments)    Unknown reaction  Tolerates Keflex, Cefepime   Sulfasalazine    Tramadol Hives    Patient Measurements: Height: 5\' 5"  (165.1 cm) Weight: 72.3 kg (159 lb 6.3 oz) IBW/kg (Calculated) : 57   Vital Signs: Temp: 98 F (36.7 C) (05/15 0609) Temp Source: Oral (05/15 0609) BP: 132/68 (05/15 0609) Pulse Rate: 69 (05/15 0609)  Labs: Recent Labs    01/19/23 0428 01/20/23 0514 01/21/23 0447  HGB  --   --  9.7*  HCT  --   --  31.0*  PLT  --   --  194  LABPROT 29.7* 30.7* 32.1*  INR 2.8* 2.9* 3.1*  CREATININE  --   --  0.94     Estimated Creatinine Clearance: 38 mL/min (by C-G formula based on SCr of 0.94 mg/dL). Medications:  PTA Warfarin 2mg  M-F and 1mg  Sat and Sun  -last dose PTA taken 5/8 @ 2200  Assessment: 89 yr female admitted with sepsis/UTI. Patient recently completed course of cefdinir for UTI and presents to ED with fever. PMH significant for COPD, PE, AFib, CKD, PAD, CHF. Pharmacy consulted to continue warfarin while admitted.  Today, 01/21/2023: INR = 3.1 is slightly supratherapeutic. Pt remains on empirically reduced dose due to DDI with metronidazole.  CBC: Hgb slightly low but stable. Plt WNL. No bleeding reported Heart-healthy diet; less than 50% meal  intake charted Drug interactions with warfarin:  Metronidazole started for intra-abdominal coverage; can increase INR via reduced warfarin clearance Cephalosporins have some potential to enhance effects of warfarin  Goal of Therapy:  INR 2-3 Monitor platelets by anticoagulation protocol: Yes   Plan:  Hold warfarin today as INR has slowly trended up to slightly supratherapeutic on reduced dose Significant interaction with metronidazole noted INR daily.  Monitor for signs of bleeding.  If patient were to discharge today on interacting antibiotics (metronidazole or FQ) > would recommend to continue with reduced dose of warfarin 1 mg PO daily while on antibiotics with INR check soon after discharge - by Monday 5/20.  Cindi Carbon, PharmD 01/21/23 8:41 AM

## 2023-01-22 ENCOUNTER — Telehealth: Payer: Self-pay

## 2023-01-22 NOTE — Telephone Encounter (Signed)
Called pt's daughter, pt was just d/c'd from the hospital yesterday. Relayed Dr. Adele Dan msg and scheduled appt around their schedule. Confirmed understanding.

## 2023-01-22 NOTE — Telephone Encounter (Signed)
-----   Message from Chuck Hint, MD sent at 01/16/2023 12:02 PM EDT ----- Regarding: RE: Proceed with Surgery I will just need to see her in the office for a routine office visit.  She does not need follow-up ABIs if she had them done in February of this year.  Thank you. CD ----- Message ----- From: Hurshel Keys, RN Sent: 01/16/2023  10:31 AM EDT To: Chuck Hint, MD Subject: Proceed with Surgery                           Dr. Edilia Bo,  This pt's daughter, [name], called last week about moving forward with the arteriogram that was originally scheduled for 02/21/22 now that her kidney function is improved. I attempted to make an appt for her, but we've been playing phone tag. She called again yesterday, but now I see she's currently admitted from the ED. She had an ABI in Feb 2024. At this point, I realize it depends on her status, but I wanted to check with you on how to follow up once she is discharged.  Thanks, Lawanna Kobus, RN - Triage

## 2023-02-12 ENCOUNTER — Ambulatory Visit (INDEPENDENT_AMBULATORY_CARE_PROVIDER_SITE_OTHER): Payer: Medicare HMO | Admitting: Vascular Surgery

## 2023-02-12 ENCOUNTER — Encounter: Payer: Self-pay | Admitting: Vascular Surgery

## 2023-02-12 VITALS — BP 92/61 | HR 59 | Resp 20 | Ht 65.0 in | Wt 160.0 lb

## 2023-02-12 DIAGNOSIS — L97909 Non-pressure chronic ulcer of unspecified part of unspecified lower leg with unspecified severity: Secondary | ICD-10-CM

## 2023-02-12 DIAGNOSIS — I70299 Other atherosclerosis of native arteries of extremities, unspecified extremity: Secondary | ICD-10-CM

## 2023-02-12 NOTE — Progress Notes (Signed)
REASON FOR VISIT:   Follow-up of peripheral arterial disease with ulceration right leg.  MEDICAL ISSUES:   PERIPHERAL ARTERIAL DISEASE WITH ULCERATION: This patient has nonhealing wounds of both feet with evidence of infrainguinal arterial occlusive disease bilaterally.  This is clearly a limb threatening situation.  I have recommended arteriography in order to determine if she has any endovascular options for revascularization.  Her renal function has improved and her most recent labs in May showed a normal creatinine.  She does have significant cardiac issues and I have sent Dr. Cristal Deer a message to discuss proceeding with arteriography.  Given her age and comorbidities she would not be a candidate for open surgery.  However I think it would be reasonable to proceed with arteriography and possible endovascular intervention.  Without revascularization she is at high risk for limb loss.  I have reviewed with the patient the indications for arteriography. In addition, I have reviewed the potential complications of arteriography including but not limited to: Bleeding, arterial injury, arterial thrombosis, dye action, renal insufficiency, or other unpredictable medical problems. I have explained to the patient that if we find disease amenable to angioplasty we could potentially address this at the same time. I have discussed the potential complications of angioplasty and stenting, including but not limited to: Bleeding, arterial thrombosis, arterial injury, dissection, or the need for surgical intervention.  [name] has atherosclerosis of the native arteries of the Bilateral lower extremities causing ulceration. The patient is on best medical therapy for peripheral arterial disease. The patient has been counseled about the risks of tobacco use in atherosclerotic disease. The patient has been counseled to abstain from any tobacco use. An aortogram with bilateral lower extremity runoff  angiography and Bilateral lower extremity intervention and is indicated to better evaluate the patient's lower extremity circulation because of the limb threatening nature of the patient's diagnosis. Based on the patient's clinical exam and non-invasive data, we anticipate an endovascular intervention in the femoropopliteal and tibial vessels.  Stenting or  athrectomy would be favored because of the improved primary patency of these interventions as compared to plain balloon angioplasty.   HPI:   [name] is a pleasant 89 y.o. female who I saw a year ago on 02/06/2022 with peripheral arterial disease and an ulcer on her right leg.  She had a nonhealing wound of the right foot and evidence of tibial artery occlusive disease.  She was set up for an arteriogram but her creatinine was significantly elevated and this was subsequently canceled.  She was then lost to follow-up.  Of note she did get COVID and was in the hospital some with this.  She also had significant leg swelling which ultimately responded to diuresis.  She developed wounds on the right third toe and left second toe about 6 months ago.  About 4 to 5 months ago she got a wound on her left heel.  She has known infrainguinal arterial occlusive disease.  I do not get any history of claudication although her activity is very limited.  She denies any history of rest pain.  Past Medical History:  Diagnosis Date   Anticoagulant long-term use    Arrhythmia    Arthritis    Atrial fibrillation (HCC)    Congestive heart failure (CHF) (HCC)    Dysphagia    Esophageal reflux    Essential hypertension    Hiatal hernia    Pulmonary embolus (HCC)    Thyroid disease  Family History  Problem Relation Age of Onset   Stroke Mother    Cancer Brother     SOCIAL HISTORY: Social History   Tobacco Use   Smoking status: Former    Packs/day: 1    Types: Cigarettes    Quit date: 10/02/2001    Years since quitting: 21.3   Smokeless  tobacco: Never   Tobacco comments:    former smoker  Substance Use Topics   Alcohol use: Not on file    Allergies  Allergen Reactions   Amoxicillin Other (See Comments)    Unknown reaction  Tolerates Keflex, Cefepime   Azithromycin Other (See Comments)    Unknown reaction    Codeine Other (See Comments)    Unknown reaction    Erythromycin Other (See Comments)    Unknown reaction    Green Dyes Other (See Comments)    Allergic to ALL dyes   Iodine Other (See Comments)    Unknown reaction    Misc. Sulfonamide Containing Compounds    Oxycodone Other (See Comments)    Unknown reaction      Oxycodone-Acetaminophen Other (See Comments)    Unknown reaction    Penicillins Other (See Comments)    Unknown reaction  Tolerates Keflex, Cefepime   Sulfasalazine    Tramadol Hives    Current Outpatient Medications  Medication Sig Dispense Refill   acetaminophen (TYLENOL) 325 MG tablet Take 2 tablets (650 mg total) by mouth every 6 (six) hours as needed for mild pain or headache. (Patient taking differently: Take 650 mg by mouth every 6 (six) hours as needed for mild pain, headache or fever.)     bumetanide (BUMEX) 0.5 MG tablet Take 0.5 mg by mouth 2 (two) times daily.     carboxymethylcellulose (REFRESH PLUS) 0.5 % SOLN Place 1 drop into both eyes 2 (two) times daily.     cholecalciferol (VITAMIN D3) 25 MCG (1000 UT) tablet Take 1,000 Units by mouth daily.     ciprofloxacin (CIPRO) 500 MG tablet SMARTSIG:1 Tablet(s) By Mouth Every 12 Hours     diclofenac Sodium (VOLTAREN) 1 % GEL Apply 4 g topically 4 (four) times daily. To bilateral feet and knees (Patient taking differently: Apply 4 g topically 4 (four) times daily as needed (pain). To bilateral feet and knees)     gabapentin (NEURONTIN) 300 MG capsule Take 300 mg by mouth 2 (two) times daily.      levothyroxine (SYNTHROID) 100 MCG tablet Take 100 mcg by mouth every morning.     Multiple Vitamins-Minerals (PRESERVISION/LUTEIN) CAPS  Take 1 capsule by mouth 2 (two) times daily.      pantoprazole (PROTONIX) 40 MG tablet Take 40 mg by mouth daily.     polyethylene glycol powder (MIRALAX) 17 GM/SCOOP powder Take 17 g by mouth 2 (two) times daily as needed for moderate constipation. 255 g 0   senna-docusate (SENOKOT-S) 8.6-50 MG tablet Take 1 tablet by mouth 2 (two) times daily between meals as needed for mild constipation. 60 tablet 0   spironolactone (ALDACTONE) 25 MG tablet Take 0.5 tablets (12.5 mg total) by mouth daily. 45 tablet 3   vitamin B-12 (CYANOCOBALAMIN) 1000 MCG tablet Take 1,000 mcg by mouth daily.     warfarin (COUMADIN) 1 MG tablet Take 1-2 mg by mouth as directed. Take 2 tablets (2 mg) (Monday thru Friday) & Take 1 tablet (1 mg) on (Saturday & Sunday)     No current facility-administered medications for this visit.    REVIEW OF  SYSTEMS:  [X]  denotes positive finding, [ ]  denotes negative finding Cardiac  Comments:  Chest pain or chest pressure:    Shortness of breath upon exertion: x   Short of breath when lying flat:    Irregular heart rhythm:        Vascular    Pain in calf, thigh, or hip brought on by ambulation:    Pain in feet at night that wakes you up from your sleep:     Blood clot in your veins:    Leg swelling:         Pulmonary    Oxygen at home:    Productive cough:     Wheezing:         Neurologic    Sudden weakness in arms or legs:     Sudden numbness in arms or legs:     Sudden onset of difficulty speaking or slurred speech:    Temporary loss of vision in one eye:     Problems with dizziness:         Gastrointestinal    Blood in stool:     Vomited blood:         Genitourinary    Burning when urinating:     Blood in urine:        Psychiatric    Major depression:         Hematologic    Bleeding problems:    Problems with blood clotting too easily:        Skin    Rashes or ulcers: x       Constitutional    Fever or chills:     PHYSICAL EXAM:   Vitals:    02/12/23 1410  BP: 92/61  Pulse: (!) 59  Resp: 20  SpO2: (!) 89%  Weight: 160 lb (72.6 kg)  Height: 5\' 5"  (1.651 m)    GENERAL: The patient is a well-nourished female, in no acute distress. The vital signs are documented above. CARDIAC: There is a regular rate and rhythm.  VASCULAR: I do not detect carotid bruits. She has palpable femoral pulses. I cannot palpate popliteal pulses. I cannot palpate pedal pulses. PULMONARY: There is good air exchange bilaterally without wheezing or rales. ABDOMEN: Soft and non-tender with normal pitched bowel sounds.  MUSCULOSKELETAL: There are no major deformities or cyanosis. NEUROLOGIC: No focal weakness or paresthesias are detected. SKIN: She has an ulcer on the right third toe as documented in the photograph below.  She has an ulcer on the left second toe and also on the heel as documented in the photographs below.     PSYCHIATRIC: The patient has a normal affect.  DATA:    LABS: I reviewed her labs from 01/21/2023.  Her creatinine was 0.94.  GFR was 57.  ARTERIAL DOPPLER STUDY: I reviewed the arterial Doppler study that was done on 11/04/2022.  On the right side she had a monophasic posterior tibial and dorsalis pedis signal.  ABI was 48%.  Toe pressure was 62 mmHg.  On the left side she had a monophasic posterior tibial and dorsalis pedis signal.  ABI was 46%.  Toe pressure was 34 mmHg.  ECHO: I reviewed her echo that was done on 12/19/2022.  Ejection fraction was 65 to 70%.  Of note she had severely elevated pulmonary artery systolic pressure.  Waverly Ferrari [facility] [phone]

## 2023-02-13 ENCOUNTER — Encounter (HOSPITAL_BASED_OUTPATIENT_CLINIC_OR_DEPARTMENT_OTHER): Payer: Self-pay | Admitting: Cardiology

## 2023-02-13 ENCOUNTER — Telehealth (INDEPENDENT_AMBULATORY_CARE_PROVIDER_SITE_OTHER): Payer: Medicare HMO | Admitting: Cardiology

## 2023-02-13 VITALS — BP 99/53 | HR 72 | Temp 98.1°F | Ht 63.0 in | Wt 159.0 lb

## 2023-02-13 DIAGNOSIS — I10 Essential (primary) hypertension: Secondary | ICD-10-CM

## 2023-02-13 DIAGNOSIS — I4821 Permanent atrial fibrillation: Secondary | ICD-10-CM

## 2023-02-13 DIAGNOSIS — I272 Pulmonary hypertension, unspecified: Secondary | ICD-10-CM

## 2023-02-13 DIAGNOSIS — I350 Nonrheumatic aortic (valve) stenosis: Secondary | ICD-10-CM | POA: Diagnosis not present

## 2023-02-13 DIAGNOSIS — I5032 Chronic diastolic (congestive) heart failure: Secondary | ICD-10-CM | POA: Diagnosis not present

## 2023-02-13 DIAGNOSIS — I110 Hypertensive heart disease with heart failure: Secondary | ICD-10-CM

## 2023-02-13 DIAGNOSIS — Z7901 Long term (current) use of anticoagulants: Secondary | ICD-10-CM

## 2023-02-13 DIAGNOSIS — I739 Peripheral vascular disease, unspecified: Secondary | ICD-10-CM

## 2023-02-13 DIAGNOSIS — R600 Localized edema: Secondary | ICD-10-CM

## 2023-02-13 DIAGNOSIS — D6869 Other thrombophilia: Secondary | ICD-10-CM

## 2023-02-13 NOTE — Patient Instructions (Signed)
Medication Instructions:  Your physician recommends that you continue on your current medications as directed. Please refer to the Current Medication list given to you today.  *If you need a refill on your cardiac medications before your next appointment, please call your pharmacy*  Follow-Up: At [facility], you and your health needs are our priority.  As part of our continuing mission to provide you with exceptional heart care, we have created designated Provider Care Teams.  These Care Teams include your primary Cardiologist (physician) and Advanced Practice Providers (APPs -  Physician Assistants and Nurse Practitioners) who all work together to provide you with the care you need, when you need it.  We recommend signing up for the patient portal called "MyChart".  Sign up information is provided on this After Visit Summary.  MyChart is used to connect with patients for Virtual Visits (Telemedicine).  Patients are able to view lab/test results, encounter notes, upcoming appointments, etc.  Non-urgent messages can be sent to your provider as well.   To learn more about what you can do with MyChart, go to [email or web address].    Your next appointment:   Monday 04/06/23 at 2:20 PM (Telephone Visit)  Provider:   [name], MD

## 2023-02-13 NOTE — Progress Notes (Signed)
Virtual Visit via Telephone Note   Because of Vonetta M Vorce's co-morbid illnesses, she is at least at moderate risk for complications without adequate follow up.  This format is felt to be most appropriate for this patient at this time.  The patient did not have access to video technology/had technical difficulties with video requiring transitioning to audio format only (telephone).  All issues noted in this document were discussed and addressed.  No physical exam could be performed with this format.  Please refer to the patient's chart for her consent to telehealth for [facility].   The patient was identified using 2 identifiers.  Patient Location: Home Provider Location: Office/Clinic   Date:  02/13/2023   ID:  CARSYN SALM, DOB [birth year], MRN [id number]  PCP:  Camie Patience, FNP  Cardiologist:  [name], MD PhD  Referring MD: Eloisa Northern, MD   CC: follow up  History of Present Illness:    EMMRIE DARRAS is a 89 y.o. female with a hx of atrial fibrillation on coumadin, essential hypertension, pulmonary hypertension, and chronic diastolic heart failure, here for follow up. Her initial consult (telemedicine) was 11/25/18.   Cardiac history: Previously followed by Dr. Orvis Brill at [facility] (notes under media tab). Per notes:  -echo 2014, lexiscan 2015 low risk,  -echo 2019 hyperdynamic EF, septal flattening, severe RV dilation, RVSP noted as critical (near systemic) pulmonary hypertension, severe biatrial enlargement, dilated coronary sinus ?persistent left SVC.  -Venous study 2019 with bilateral mild insufficiency -Echo 11/10/17 with hyperdynamic EF, septal flattening, severe RV dilation, severe TR, severe pulmonary hypertension (listed at 71 mmHg in note)  Had recent hospitalization, lost 50 lbs in fluid. Continues to deal with LE wounds.  Today: Reviewed recent hospitalization for UTI/sepsis. Reviewed hospitalization. Also saw Dr. Edilia Bo yesterday.  Discussed over the phone with patient and daughter. Discussed recent hospitalization, UTI treatment. Reviewed PAD, nonhealing wounds. Discussed risk/benefit at length today  Past Medical History:  Diagnosis Date   Anticoagulant long-term use    Arrhythmia    Arthritis    Atrial fibrillation (HCC)    Congestive heart failure (CHF) (HCC)    Dysphagia    Esophageal reflux    Essential hypertension    Hiatal hernia    Pulmonary embolus (HCC)    Thyroid disease     Past Surgical History:  Procedure Laterality Date   AMPUTATION TOE Right 11/17/2021   Procedure: RIGHT SECOND TOE AMPUTATION;  Surgeon: [name], MD;  Location: [facility];  Service: Orthopedics;  Laterality: Right;   COLONOSCOPY     ELBOW SURGERY     LAPAROSCOPIC HYSTERECTOMY      Current Medications: Current Outpatient Medications on File Prior to Visit  Medication Sig   acetaminophen (TYLENOL) 325 MG tablet Take 2 tablets (650 mg total) by mouth every 6 (six) hours as needed for mild pain or headache. (Patient taking differently: Take 650 mg by mouth every 6 (six) hours as needed for mild pain, headache or fever.)   bumetanide (BUMEX) 0.5 MG tablet Take 0.5 mg by mouth 2 (two) times daily.   carboxymethylcellulose (REFRESH PLUS) 0.5 % SOLN Place 1 drop into both eyes 2 (two) times daily.   cholecalciferol (VITAMIN D3) 25 MCG (1000 UT) tablet Take 1,000 Units by mouth daily.   ciprofloxacin (CIPRO) 500 MG tablet SMARTSIG:1 Tablet(s) By Mouth Every 12 Hours   diclofenac Sodium (VOLTAREN) 1 % GEL Apply 4 g topically 4 (four) times daily. To bilateral  feet and knees (Patient taking differently: Apply 4 g topically 4 (four) times daily as needed (pain). To bilateral feet and knees)   gabapentin (NEURONTIN) 300 MG capsule Take 300 mg by mouth 2 (two) times daily.    levothyroxine (SYNTHROID) 100 MCG tablet Take 100 mcg by mouth every morning.   Multiple Vitamins-Minerals (PRESERVISION/LUTEIN) CAPS Take 1 capsule by mouth 2  (two) times daily.    pantoprazole (PROTONIX) 40 MG tablet Take 40 mg by mouth daily.   polyethylene glycol powder (MIRALAX) 17 GM/SCOOP powder Take 17 g by mouth 2 (two) times daily as needed for moderate constipation.   senna-docusate (SENOKOT-S) 8.6-50 MG tablet Take 1 tablet by mouth 2 (two) times daily between meals as needed for mild constipation.   spironolactone (ALDACTONE) 25 MG tablet Take 0.5 tablets (12.5 mg total) by mouth daily.   vitamin B-12 (CYANOCOBALAMIN) 1000 MCG tablet Take 1,000 mcg by mouth daily.   warfarin (COUMADIN) 1 MG tablet Take 1-2 mg by mouth as directed. Take 2 tablets (2 mg) (Monday thru Friday) & Take 1 tablet (1 mg) on (Saturday & Sunday)   No current facility-administered medications on file prior to visit.     Allergies:   Amoxicillin, Azithromycin, Codeine, Erythromycin, Green dyes, Iodine, Misc. sulfonamide containing compounds, Oxycodone, Oxycodone-acetaminophen, Penicillins, Sulfasalazine, and Tramadol   Social History   Tobacco Use   Smoking status: Former    Packs/day: 1    Types: Cigarettes    Quit date: 10/02/2001    Years since quitting: 21.3   Smokeless tobacco: Never   Tobacco comments:    former smoker  Vaping Use   Vaping Use: Never used     Family History: The patient's family history includes Cancer in her brother; Stroke in her mother.  ROS:   Please see the history of present illness.   Additional pertinent ROS otherwise unremarkable.  EKGs/Labs/Other Studies Reviewed:    The following studies were reviewed today Echo 12/31/2022: 1. Left ventricular ejection fraction, by estimation, is 65 to 70%. The  left ventricle has hyperdynamic function. There is mild concentric left  ventricular hypertrophy. Left ventricular diastolic function could not be  evaluated.   2. Right ventricular systolic function is mildly reduced. The right  ventricular size is mildly enlarged. There is severely elevated pulmonary  artery systolic  pressure. The estimated right ventricular systolic  pressure is 68.6 mmHg.   3. Left atrial size was severely dilated.   4. Right atrial size was severely dilated.   5. Mild mitral valve regurgitation. No evidence of mitral stenosis.  Moderate mitral annular calcification.   6. Tricuspid valve regurgitation is moderate to severe.   7. Moderate paradoxical low-flow, low-gradient aortic stenosis. Stroke  volume index 33 ml/m sq. The aortic valve is tricuspid. There is moderate  calcification of the aortic valve. There is moderate thickening of the  aortic valve. Aortic valve  regurgitation is mild. Moderate aortic valve stenosis. Aortic valve mean  gradient measures 12.0 mmHg. Aortic valve Vmax measures 2.24 m/s. Aortic  valve acceleration time measures 74 msec.   8. The inferior vena cava is dilated in size with <50% respiratory  variability, suggesting right atrial pressure of 15 mmHg.   Comparison(s): Prior images reviewed side by side. The aortic valve  gradients were inaccurately reported on the previous study (TR velocities  were interpreted as representing aortic flow). TR is less and RV function  has improved.   ABI 11/04/22 Summary:  Right: Resting right ankle-brachial index indicates  severe right lower  extremity arterial disease. The right toe-brachial index is abnormal.   Left: Resting left ankle-brachial index indicates severe left lower  extremity arterial disease. The left toe-brachial index is abnormal.   Bilateral LE Arterial Doppler 01/30/2022: Summary:  Right: Limited visualization due to involuntary movement and swelling.  Elevated velocities in the proximal/mid superficial femoral artery  suggesting 30-49% stenosis. Probable tibial occlusive disease - Posterior  tibial artery appears occluded with  reconstitution of flow in the distal calf. Peroneal artery unable to be  visualized.   Left: Limited visualization due to involuntary movement and swelling.   Elevated velocities in the proximal/mid superficial femoral artery  suggesting 50-74% stenosis. Probable tibial occlusive disease - Posterior  tibial artery appears occluded. Peroneal  artery unable to be visualized.   ABI Doppler 01/30/2022: Summary:  Right: Resting right ankle-brachial index indicates moderate right lower  extremity arterial disease.   Left: Resting left ankle-brachial index indicates mild left lower  extremity arterial disease. The left toe-brachial index is abnormal.    EKG:  EKG is personally reviewed.  02/13/23 (phone visit) 08/28/2022: atrial fib at 55 bpm, rbbb  05/21/22: not ordered today 02/13/2022:  atrial fibrillation at 44 bpm, iRBBB 02/05/2021: Atrial fibrillation, Rate 54 bpm, iRBBB 01/05/20: Afib at 45 bpm  Recent Labs: 11/01/2022: B Natriuretic Peptide 329.2 01/15/2023: TSH 0.997 01/16/2023: ALT 19; Magnesium 2.1 01/17/2023: BUN 15; Potassium 3.7; Sodium 130 01/21/2023: Creatinine, Ser 0.94; Hemoglobin 9.7; Platelets 194   Recent Lipid Panel No results found for: "CHOL", "TRIG", "HDL", "CHOLHDL", "VLDL", "LDLCALC", "LDLDIRECT"  Physical Exam:    VS:  BP (!) 99/53   Pulse 72   Temp 98.1 F (36.7 C)   Ht 5\' 3"  (1.6 m)   Wt 159 lb (72.1 kg)   SpO2 97%   BMI 28.17 kg/m     Wt Readings from Last 3 Encounters:  02/13/23 159 lb (72.1 kg)  02/12/23 160 lb (72.6 kg)  01/15/23 159 lb 6.3 oz (72.3 kg)   Speaking comfortably on the phone, no audible wheezing In no acute distress Alert and oriented Normal affect Normal speech   ASSESSMENT:    1. Chronic diastolic heart failure (HCC)   2. Severe aortic stenosis   3. Essential hypertension   4. Permanent atrial fibrillation (HCC)   5. Secondary hypercoagulable state (HCC)   6. Moderate to severe pulmonary hypertension (HCC)   7. Anticoagulant long-term use   8. Bilateral leg edema   9. Peripheral vascular disease, unspecified (HCC)     PLAN:    Peripheral edema 2/2 severe pulmonary  hypertension and chronic diastolic heart failure -has stabilized significantly. Daughter very involved in her care. Weights and symptoms stable. -Continue bumex and spironolactone as noted -continue daily weights, leg elevation, compression stockings, salt avoidance -we have extensively discussed her pulmonary hypertension, see prior notes  Lower extremity wounds PAD -following with Dr. Edilia Bo -nonhealing lower extremity wounds -I recommended they pursue angiography for further evaluation and management, discussed risk/benefit today to the best of my ability  Hypertension: -continue diuretics as above  Atrial fibrillation, permanent Slow ventricular response -CHA2DS2/VAS=at least 5 -on coumadin, managed per PCP.   Plan for follow up: 6 weeks, virtual ok  Today, I have spent 25 minutes with the patient with telehealth technology discussing the above problems.  Additional time spent in chart review, documentation, and communication.   [name], MD, PhD, [facility]  [facility]    Medication Adjustments/Labs and Tests Ordered: Current medicines  are reviewed at length with the patient today.  Concerns regarding medicines are outlined above.   No orders of the defined types were placed in this encounter.  No orders of the defined types were placed in this encounter.  Patient Instructions  Medication Instructions:  Your physician recommends that you continue on your current medications as directed. Please refer to the Current Medication list given to you today.  *If you need a refill on your cardiac medications before your next appointment, please call your pharmacy*  Follow-Up: At [facility], you and your health needs are our priority.  As part of our continuing mission to provide you with exceptional heart care, we have created designated Provider Care Teams.  These Care Teams include your primary Cardiologist (physician) and Advanced Practice  Providers (APPs -  Physician Assistants and Nurse Practitioners) who all work together to provide you with the care you need, when you need it.  We recommend signing up for the patient portal called "MyChart".  Sign up information is provided on this After Visit Summary.  MyChart is used to connect with patients for Virtual Visits (Telemedicine).  Patients are able to view lab/test results, encounter notes, upcoming appointments, etc.  Non-urgent messages can be sent to your provider as well.   To learn more about what you can do with MyChart, go to [email or web address].    Your next appointment:   Monday 04/06/23 at 2:20 PM (Telephone Visit)  Provider:   [name], MD        Signed, [name], MD PhD 02/13/2023  [facility]

## 2023-02-19 ENCOUNTER — Other Ambulatory Visit: Payer: Self-pay

## 2023-02-19 ENCOUNTER — Telehealth: Payer: Self-pay

## 2023-02-19 DIAGNOSIS — R399 Unspecified symptoms and signs involving the genitourinary system: Secondary | ICD-10-CM

## 2023-02-19 DIAGNOSIS — I7025 Atherosclerosis of native arteries of other extremities with ulceration: Secondary | ICD-10-CM

## 2023-02-19 NOTE — Telephone Encounter (Signed)
Attempted to reach patient/daughter to schedule aortogram, no answer. Left VM for patient to return call.

## 2023-02-19 NOTE — Telephone Encounter (Signed)
Spoke with patient's daughter [name]. Scheduled procedure for 6/24 and instructions provided. She voiced understanding.

## 2023-02-27 ENCOUNTER — Encounter (HOSPITAL_BASED_OUTPATIENT_CLINIC_OR_DEPARTMENT_OTHER): Payer: Self-pay | Admitting: Cardiology

## 2023-02-27 ENCOUNTER — Telehealth: Payer: Self-pay

## 2023-02-27 NOTE — Addendum Note (Signed)
Addended by: Primitivo Gauze on: 02/27/2023 04:12 PM   Modules accepted: Orders

## 2023-02-27 NOTE — Telephone Encounter (Signed)
Received a call from patient's daughter [name] reporting that patient developed urinary symptoms of abdominal pressure, urgency and burning. She had a U/A completed and states Dr. Chanetta Marshall advised "urine looks pretty clear but because of urinary history, take OTC- AZO extra-strength pain relief and will send off for culture." [name] inquires if this will interfere with procedure.   Spoke with Dr. Edilia Bo and recommend for patient to plan for aortogram as scheduled on 6/24 with a U/A completed upon arrival, unless family wants to postpone. If bacteria is present, then will have to postpone procedure. Patient/family can call if any changes over the weekend.   Informed [name] of provider recommendations and she voiced understanding. U/A order placed.

## 2023-03-02 ENCOUNTER — Encounter (HOSPITAL_COMMUNITY)
Admission: RE | Disposition: A | Discharge status: Home / Self Care | Payer: Self-pay | Source: Home / Self Care | Attending: Vascular Surgery

## 2023-03-02 ENCOUNTER — Other Ambulatory Visit: Payer: Self-pay

## 2023-03-02 ENCOUNTER — Ambulatory Visit (HOSPITAL_COMMUNITY)
Admission: RE | Admit: 2023-03-02 | Discharge: 2023-03-02 | Disposition: A | Discharge status: Home / Self Care | Payer: Medicare HMO | Attending: Vascular Surgery | Admitting: Vascular Surgery

## 2023-03-02 DIAGNOSIS — I872 Venous insufficiency (chronic) (peripheral): Secondary | ICD-10-CM

## 2023-03-02 DIAGNOSIS — Z539 Procedure and treatment not carried out, unspecified reason: Secondary | ICD-10-CM | POA: Insufficient documentation

## 2023-03-02 DIAGNOSIS — I70221 Atherosclerosis of native arteries of extremities with rest pain, right leg: Secondary | ICD-10-CM | POA: Diagnosis not present

## 2023-03-02 DIAGNOSIS — N390 Urinary tract infection, site not specified: Secondary | ICD-10-CM

## 2023-03-02 DIAGNOSIS — R399 Unspecified symptoms and signs involving the genitourinary system: Secondary | ICD-10-CM

## 2023-03-02 DIAGNOSIS — I7025 Atherosclerosis of native arteries of other extremities with ulceration: Secondary | ICD-10-CM

## 2023-03-02 LAB — URINALYSIS, COMPLETE (UACMP) WITH MICROSCOPIC
Bilirubin Urine: NEGATIVE
Glucose, UA: NEGATIVE mg/dL
Hgb urine dipstick: NEGATIVE
Ketones, ur: NEGATIVE mg/dL
Leukocytes,Ua: NEGATIVE
Nitrite: POSITIVE — AB
Protein, ur: NEGATIVE mg/dL
Specific Gravity, Urine: 1.003 — ABNORMAL LOW (ref 1.005–1.030)
pH: 7 (ref 5.0–8.0)

## 2023-03-02 LAB — POCT I-STAT, CHEM 8
BUN: 15 mg/dL (ref 8–23)
Calcium, Ion: 1.22 mmol/L (ref 1.15–1.40)
Chloride: 86 mmol/L — ABNORMAL LOW (ref 98–111)
Creatinine, Ser: 1.1 mg/dL — ABNORMAL HIGH (ref 0.44–1.00)
Glucose, Bld: 86 mg/dL (ref 70–99)
HCT: 31 % — ABNORMAL LOW (ref 36.0–46.0)
Hemoglobin: 10.5 g/dL — ABNORMAL LOW (ref 12.0–15.0)
Potassium: 3.6 mmol/L (ref 3.5–5.1)
Sodium: 127 mmol/L — ABNORMAL LOW (ref 135–145)
TCO2: 32 mmol/L (ref 22–32)

## 2023-03-02 LAB — PROTIME-INR
INR: 2 — ABNORMAL HIGH (ref 0.8–1.2)
Prothrombin Time: 22.9 seconds — ABNORMAL HIGH (ref 11.4–15.2)

## 2023-03-02 SURGERY — ABDOMINAL AORTOGRAM W/LOWER EXTREMITY
Anesthesia: LOCAL

## 2023-03-02 MED ORDER — METHYLPREDNISOLONE SODIUM SUCC 125 MG IJ SOLR: 125.0000 mg | Freq: Once | INTRAMUSCULAR | Status: DC

## 2023-03-02 MED ORDER — SODIUM CHLORIDE 0.9 % IV SOLN: INTRAVENOUS | Status: DC

## 2023-03-02 MED ORDER — DIPHENHYDRAMINE HCL 50 MG/ML IJ SOLN: 25.0000 mg | Freq: Once | INTRAMUSCULAR | Status: DC

## 2023-03-02 NOTE — Progress Notes (Signed)
In and out cath with 26F cath and drained clear yellow urine and urine sent to lab for u/a

## 2023-03-03 ENCOUNTER — Other Ambulatory Visit: Payer: Self-pay

## 2023-03-03 ENCOUNTER — Telehealth: Payer: Self-pay

## 2023-03-03 DIAGNOSIS — I7025 Atherosclerosis of native arteries of other extremities with ulceration: Secondary | ICD-10-CM

## 2023-03-03 MED ORDER — CIPROFLOXACIN HCL 250 MG PO TABS: 250.0000 mg | ORAL_TABLET | Freq: Two times a day (BID) | ORAL | 0 refills | Status: AC

## 2023-03-03 NOTE — Telephone Encounter (Signed)
Abnormal U/A showing positive for nitrites and bacteria. Per Dr. Edilia Bo, start patient on Cipro 250 mg BID for 3 days starting today and can proceed with rescheduling arteriogram on Wed.   Rx sent to pharmacy. Attempted to reach patient's daughter [name]. Left VM to return call.

## 2023-03-04 ENCOUNTER — Encounter (HOSPITAL_COMMUNITY)
Admission: RE | Disposition: A | Discharge status: Home / Self Care | Payer: Self-pay | Source: Home / Self Care | Attending: Vascular Surgery

## 2023-03-04 ENCOUNTER — Ambulatory Visit (HOSPITAL_COMMUNITY)
Admission: RE | Admit: 2023-03-04 | Discharge: 2023-03-04 | Disposition: A | Discharge status: Home / Self Care | Payer: Medicare HMO | Attending: Vascular Surgery | Admitting: Vascular Surgery

## 2023-03-04 ENCOUNTER — Other Ambulatory Visit: Payer: Self-pay

## 2023-03-04 DIAGNOSIS — Z87891 Personal history of nicotine dependence: Secondary | ICD-10-CM | POA: Insufficient documentation

## 2023-03-04 DIAGNOSIS — I7025 Atherosclerosis of native arteries of other extremities with ulceration: Secondary | ICD-10-CM

## 2023-03-04 DIAGNOSIS — Z86711 Personal history of pulmonary embolism: Secondary | ICD-10-CM | POA: Insufficient documentation

## 2023-03-04 DIAGNOSIS — I509 Heart failure, unspecified: Secondary | ICD-10-CM | POA: Diagnosis not present

## 2023-03-04 DIAGNOSIS — I70235 Atherosclerosis of native arteries of right leg with ulceration of other part of foot: Secondary | ICD-10-CM | POA: Diagnosis present

## 2023-03-04 DIAGNOSIS — L97529 Non-pressure chronic ulcer of other part of left foot with unspecified severity: Secondary | ICD-10-CM | POA: Insufficient documentation

## 2023-03-04 DIAGNOSIS — I70244 Atherosclerosis of native arteries of left leg with ulceration of heel and midfoot: Secondary | ICD-10-CM | POA: Insufficient documentation

## 2023-03-04 DIAGNOSIS — L97519 Non-pressure chronic ulcer of other part of right foot with unspecified severity: Secondary | ICD-10-CM | POA: Insufficient documentation

## 2023-03-04 DIAGNOSIS — I110 Hypertensive heart disease with heart failure: Secondary | ICD-10-CM | POA: Insufficient documentation

## 2023-03-04 DIAGNOSIS — L97429 Non-pressure chronic ulcer of left heel and midfoot with unspecified severity: Secondary | ICD-10-CM | POA: Diagnosis not present

## 2023-03-04 DIAGNOSIS — I8290 Acute embolism and thrombosis of unspecified vein: Secondary | ICD-10-CM

## 2023-03-04 DIAGNOSIS — I70245 Atherosclerosis of native arteries of left leg with ulceration of other part of foot: Secondary | ICD-10-CM | POA: Diagnosis not present

## 2023-03-04 HISTORY — PX: ABDOMINAL AORTOGRAM W/LOWER EXTREMITY: CATH118223

## 2023-03-04 LAB — POCT I-STAT, CHEM 8
BUN: 11 mg/dL (ref 8–23)
Calcium, Ion: 1.23 mmol/L (ref 1.15–1.40)
Chloride: 89 mmol/L — ABNORMAL LOW (ref 98–111)
Creatinine, Ser: 1.1 mg/dL — ABNORMAL HIGH (ref 0.44–1.00)
Glucose, Bld: 83 mg/dL (ref 70–99)
HCT: 35 % — ABNORMAL LOW (ref 36.0–46.0)
Hemoglobin: 11.9 g/dL — ABNORMAL LOW (ref 12.0–15.0)
Potassium: 3.4 mmol/L — ABNORMAL LOW (ref 3.5–5.1)
Sodium: 133 mmol/L — ABNORMAL LOW (ref 135–145)
TCO2: 37 mmol/L — ABNORMAL HIGH (ref 22–32)

## 2023-03-04 LAB — PROTIME-INR
INR: 1.4 — ABNORMAL HIGH (ref 0.8–1.2)
Prothrombin Time: 17.2 seconds — ABNORMAL HIGH (ref 11.4–15.2)

## 2023-03-04 SURGERY — ABDOMINAL AORTOGRAM W/LOWER EXTREMITY
Anesthesia: LOCAL

## 2023-03-04 MED ORDER — METHYLPREDNISOLONE SODIUM SUCC 125 MG IJ SOLR
125.0000 mg | Freq: Once | INTRAMUSCULAR | Status: AC
  Administered 2023-03-04: 125 mg via INTRAVENOUS
  Filled 2023-03-04: qty 2

## 2023-03-04 MED ORDER — HYDRALAZINE HCL 20 MG/ML IJ SOLN
INTRAMUSCULAR | Status: AC
  Filled 2023-03-04: qty 1

## 2023-03-04 MED ORDER — HEPARIN (PORCINE) IN NACL 1000-0.9 UT/500ML-% IV SOLN
INTRAVENOUS | Status: DC | PRN
  Administered 2023-03-04 (×2): 500 mL

## 2023-03-04 MED ORDER — SODIUM CHLORIDE 0.9 % IV SOLN: 250.0000 mL | INTRAVENOUS | Status: DC | PRN

## 2023-03-04 MED ORDER — SODIUM CHLORIDE 0.9% FLUSH: 3.0000 mL | INTRAVENOUS | Status: DC | PRN

## 2023-03-04 MED ORDER — ACETAMINOPHEN 325 MG PO TABS: 650.0000 mg | ORAL_TABLET | ORAL | Status: DC | PRN

## 2023-03-04 MED ORDER — HYDRALAZINE HCL 20 MG/ML IJ SOLN: 5.0000 mg | INTRAMUSCULAR | Status: DC | PRN

## 2023-03-04 MED ORDER — LIDOCAINE HCL (PF) 1 % IJ SOLN
INTRAMUSCULAR | Status: DC | PRN
  Administered 2023-03-04: 10 mL

## 2023-03-04 MED ORDER — HEPARIN SODIUM (PORCINE) 1000 UNIT/ML IJ SOLN
INTRAMUSCULAR | Status: DC | PRN
  Administered 2023-03-04: 2000 [IU] via INTRAVENOUS
  Administered 2023-03-04: 4000 [IU] via INTRAVENOUS
  Administered 2023-03-04: 3000 [IU] via INTRAVENOUS

## 2023-03-04 MED ORDER — HYDRALAZINE HCL 20 MG/ML IJ SOLN
INTRAMUSCULAR | Status: DC | PRN
  Administered 2023-03-04: 10 mg via INTRAVENOUS

## 2023-03-04 MED ORDER — DIPHENHYDRAMINE HCL 50 MG/ML IJ SOLN
25.0000 mg | Freq: Once | INTRAMUSCULAR | Status: AC
  Administered 2023-03-04: 25 mg via INTRAVENOUS
  Filled 2023-03-04: qty 1

## 2023-03-04 MED ORDER — HEPARIN SODIUM (PORCINE) 1000 UNIT/ML IJ SOLN
INTRAMUSCULAR | Status: AC
  Filled 2023-03-04: qty 10

## 2023-03-04 MED ORDER — CLOPIDOGREL BISULFATE 75 MG PO TABS: 75.0000 mg | ORAL_TABLET | Freq: Every day | ORAL | Status: DC

## 2023-03-04 MED ORDER — FENTANYL CITRATE (PF) 100 MCG/2ML IJ SOLN
INTRAMUSCULAR | Status: AC
  Filled 2023-03-04: qty 2

## 2023-03-04 MED ORDER — ASPIRIN 81 MG PO TBEC: 81.0000 mg | DELAYED_RELEASE_TABLET | Freq: Every day | ORAL | 2 refills | Status: DC

## 2023-03-04 MED ORDER — SODIUM CHLORIDE 0.9 % IV SOLN: INTRAVENOUS | Status: DC

## 2023-03-04 MED ORDER — FENTANYL CITRATE (PF) 100 MCG/2ML IJ SOLN
INTRAMUSCULAR | Status: DC | PRN
  Administered 2023-03-04: 25 ug via INTRAVENOUS

## 2023-03-04 MED ORDER — PROTAMINE SULFATE 10 MG/ML IV SOLN
INTRAVENOUS | Status: DC | PRN
  Administered 2023-03-04: 25 mg via INTRAVENOUS
  Administered 2023-03-04: 5 mg via INTRAVENOUS

## 2023-03-04 MED ORDER — LABETALOL HCL 5 MG/ML IV SOLN
INTRAVENOUS | Status: AC
  Filled 2023-03-04: qty 4

## 2023-03-04 MED ORDER — ATORVASTATIN CALCIUM 10 MG PO TABS: 10.0000 mg | ORAL_TABLET | Freq: Every day | ORAL | 11 refills | Status: DC

## 2023-03-04 MED ORDER — PROTAMINE SULFATE 10 MG/ML IV SOLN
INTRAVENOUS | Status: AC
  Filled 2023-03-04: qty 5

## 2023-03-04 MED ORDER — SODIUM CHLORIDE 0.9 % WEIGHT BASED INFUSION: 1.0000 mL/kg/h | INTRAVENOUS | Status: DC

## 2023-03-04 MED ORDER — LIDOCAINE HCL (PF) 1 % IJ SOLN
INTRAMUSCULAR | Status: AC
  Filled 2023-03-04: qty 30

## 2023-03-04 MED ORDER — SODIUM CHLORIDE 0.9% FLUSH: 3.0000 mL | Freq: Two times a day (BID) | INTRAVENOUS | Status: DC

## 2023-03-04 MED ORDER — LABETALOL HCL 5 MG/ML IV SOLN: 10.0000 mg | INTRAVENOUS | Status: DC | PRN

## 2023-03-04 MED ORDER — ATORVASTATIN CALCIUM 10 MG PO TABS: 10.0000 mg | ORAL_TABLET | Freq: Every day | ORAL | Status: DC

## 2023-03-04 MED ORDER — ONDANSETRON HCL 4 MG/2ML IJ SOLN: 4.0000 mg | Freq: Four times a day (QID) | INTRAMUSCULAR | Status: DC | PRN

## 2023-03-04 MED ORDER — LABETALOL HCL 5 MG/ML IV SOLN
INTRAVENOUS | Status: DC | PRN
  Administered 2023-03-04 (×2): 10 mg via INTRAVENOUS

## 2023-03-04 SURGICAL SUPPLY — 30 items
BALLN COYOTE OTW 3X120X150 (BALLOONS) ×1
BALLN COYOTE OTW 3X60X150 (BALLOONS) ×1
BALLN COYOTE OTW 4X60X150 (BALLOONS) ×1
BALLN STERLING SL OTW 3X80X150 (BALLOONS) ×1
BALLOON COYOTE OTW 3X120X150 (BALLOONS) IMPLANT
BALLOON COYOTE OTW 3X60X150 (BALLOONS) IMPLANT
BALLOON COYOTE OTW 4X60X150 (BALLOONS) IMPLANT
BALLOON STRLNG SL OTW 3X80X150 (BALLOONS) IMPLANT
CATH CXI SUPP 2.6F 150 ANG (CATHETERS) IMPLANT
CATH OMNI FLUSH 5F 65CM (CATHETERS) IMPLANT
CATH QUICKCROSS .035X135CM (MICROCATHETER) IMPLANT
CATH QUICKCROSS SUPP .035X90CM (MICROCATHETER) IMPLANT
CATH SHOCKWAVE M5 4.5X60 (CATHETERS) IMPLANT
CLOSURE PERCLOSE PROSTYLE (VASCULAR PRODUCTS) IMPLANT
GLIDEWIRE ADV .035X260CM (WIRE) IMPLANT
KIT ENCORE 26 ADVANTAGE (KITS) IMPLANT
KIT MICROPUNCTURE NIT STIFF (SHEATH) IMPLANT
KIT PV (KITS) ×1 IMPLANT
SHEATH CATAPULT 6FR 60 (SHEATH) IMPLANT
SHEATH PINNACLE 5F 10CM (SHEATH) IMPLANT
SHEATH PROBE COVER 6X72 (BAG) IMPLANT
STOPCOCK MORSE 400PSI 3WAY (MISCELLANEOUS) IMPLANT
SYR MEDRAD MARK 7 150ML (SYRINGE) ×1 IMPLANT
TRANSDUCER W/STOPCOCK (MISCELLANEOUS) ×1 IMPLANT
TRAY PV CATH (CUSTOM PROCEDURE TRAY) ×1 IMPLANT
TUBING CIL FLEX 10 FLL-RA (TUBING) IMPLANT
WIRE BENTSON .035X145CM (WIRE) IMPLANT
WIRE G V18X300CM (WIRE) IMPLANT
WIRE SPARTACORE .014X190CM (WIRE) IMPLANT
WIRE SPARTACORE .014X300CM (WIRE) IMPLANT

## 2023-03-04 NOTE — Op Note (Addendum)
Patient name: OAKLEY ORBAN     MRN: [id number]        DOB: 1927/09/15            Sex: female   03/04/2023 Pre-operative Diagnosis: Bilateral lower extremity critical limb ischemia with tissue loss Post-operative diagnosis:  Same Surgeon:  [name], MD Procedure Performed: 1.  Ultrasound-guided micropuncture access of the right common femoral artery 2.  Aortogram 3.  Second-order cannulation, left lower extremity angiogram 4.  Third cannulation x 2, superficial femoral artery angiogram, tibioperoneal trunk angiogram 5.  Balloon angioplasty 3 x 60 mm superficial femoral artery, popliteal artery, peroneal artery 6.  Shockwave-ultrasound assisted balloon angioplasty 5 x 60 mm popliteal artery, distal superficial femoral artery-270 pulses 7.  Contrast volume 8.  Device assisted closure-Mynx     Indications: Patient is a 89 year old female with critical limb ischemia with tissue loss and bilateral feet.  ABIs reduced, nonpalpable pulses.  After discussing risk and benefits of bilateral lower extremity angiogram with emphasis on the left, as there are more wounds present, Joeli elected to proceed.   Findings:  Bilateral renal arteries patent, patent infrarenal abdominal aorta.  Less than 50% stenosis of the right common iliac artery, confirmed by pullback pressure, otherwise no stenosis appreciated in the aortoiliac segments bilaterally.   On the right: Patent common femoral artery, patent profunda, patent superficial femoral artery with significant disease.  Popliteal artery patent with disease.  Anterior tibial artery not appreciated.  There appears to be occlusion at the tibioperoneal trunk.  No reconstitution appreciated distally however this is probably due to contrast timing   Left: Patent common femoral artery, profunda, eccentric plaque throughout the superficial femoral artery with tandem, flow-limiting stenosis appreciated in the distal superficial femoral artery,  multiple segments within the popliteal artery including the P1, P2, and P3 segments with greater than 80% stenosis.  There is single-vessel peroneal artery outflow with greater than 90% focal stenosis proximally.  Runoff to the foot is from medial lateral perforators filling collateral branches.              Procedure:  The patient was identified in the holding area and taken to room 8.  The patient was then placed supine on the table and prepped and draped in the usual sterile fashion.  A time out was called.  Ultrasound was used to evaluate the right common femoral artery.  It was patent .  A digital ultrasound image was acquired.  A micropuncture needle was used to access the right common femoral artery under ultrasound guidance.  An 018 wire was advanced without resistance and a micropuncture sheath was placed.  The 018 wire was removed and a benson wire was placed.  The micropuncture sheath was exchanged for a 5 french sheath.  An omniflush catheter was advanced over the wire to the level of L-1.  An abdominal angiogram was obtained.  Next, using the omniflush catheter and a benson wire, the aortic bifurcation was crossed and the catheter was placed into theleft external iliac artery and left runoff was obtained.  right runoff was performed via retrograde sheath injections.   I elected to attempt intervention on the left SFA, popliteal, peroneal arteries.  A 6 x 60 sheath was brought onto the field and parked in the left superficial femoral artery.  The patient was heparinized.  Diagnostic ultrasound followed to further define the lesions.  A wire was used to traverse the superficial femoral artery, popliteal artery,  peroneal artery.  Angiography was used and the tibioperoneal trunk to ensure true lumen.  Next, I used plain balloon angioplasty 3 x 60 mm.  This demonstrated hardly any improvement.I was very concerned using a larger regular angioplasty balloon due to the eccentric plaquing risk of rupture,  therefore I elected to move to ultrasound assisted angioplasty with the shockwave device.  I initially sized artery to 4.5 mm balloon, however this popped, necessitating I increased the size further to 5mm as we did not have another balloon.  The ultrasound is inflated to 4 atm followed by 9 rounds of 30 pulses throughout the popliteal and superficial femoral artery.   Follow-up angiography demonstrated significant improvement with resolution of flow-limiting stenosis with stenosis less than 30% throughout the superficial femoral artery and popliteal artery.  Next attention turned to the peroneal artery.  Regular balloon angioplasty was used, and this was inflated for 3 minutes.  Follow-up angiography demonstrated resolution of flow-limiting stenosis, with rest, inline flow to the chest via the peroneal artery.     I was happy with this result.  The right groin was closed using a proglide device.    Impression: Successful ultrasound assisted balloon angioplasty using the shockwave device of the superficial femoral artery, popliteal artery.  Successful balloon angioplasty of the peroneal artery.  Single-vessel peroneal artery outflow.  The patient has been maximally revascularized.       Fara Olden, MD [facility] Office: [phone]

## 2023-03-04 NOTE — Discharge Instructions (Signed)
Femoral Site Care This sheet gives you information about how to care for yourself after your procedure. Your health care provider may also give you more specific instructions. If you have problems or questions, contact your health care provider. What can I expect after the procedure?  After the procedure, it is common to have: Bruising that usually fades within 1-2 weeks. Tenderness at the site. Follow these instructions at home: Wound care Follow instructions from your health care provider about how to take care of your insertion site. Make sure you: Wash your hands with soap and water before you change your bandage (dressing). If soap and water are not available, use hand sanitizer. Remove your dressing as told by your health care provider. 24 hours Do not take baths, swim, or use a hot tub until your health care provider approves. You may shower 24-48 hours after the procedure or as told by your health care provider. Gently wash the site with plain soap and water. Pat the area dry with a clean towel. Do not rub the site. This may cause bleeding. Do not apply powder or lotion to the site. Keep the site clean and dry. Check your femoral site every day for signs of infection. Check for: Redness, swelling, or pain. Fluid or blood. Warmth. Pus or a bad smell. Activity For the first 2-3 days after your procedure, or as long as directed: Avoid climbing stairs as much as possible. Do not squat. Do not lift anything that is heavier than 10 lb (4.5 kg), or the limit that you are told, until your health care provider says that it is safe. For 5 days Rest as directed. Avoid sitting for a long time without moving. Get up to take short walks every 1-2 hours. Do not drive for 24 hours if you were given a medicine to help you relax (sedative). General instructions Take over-the-counter and prescription medicines only as told by your health care provider. Keep all follow-up visits as told by your  health care provider. This is important. Contact a health care provider if you have: A fever or chills. You have redness, swelling, or pain around your insertion site. Get help right away if: The catheter insertion area swells very fast. You pass out. You suddenly start to sweat or your skin gets clammy. The catheter insertion area is bleeding, and the bleeding does not stop when you hold steady pressure on the area. The area near or just beyond the catheter insertion site becomes pale, cool, tingly, or numb. These symptoms may represent a serious problem that is an emergency. Do not wait to see if the symptoms will go away. Get medical help right away. Call your local emergency services (911 in the [place] Do not drive yourself to the hospital. Summary After the procedure, it is common to have bruising that usually fades within 1-2 weeks. Check your femoral site every day for signs of infection. Do not lift anything that is heavier than 10 lb (4.5 kg), or the limit that you are told, until your health care provider says that it is safe. This information is not intended to replace advice given to you by your health care provider. Make sure you discuss any questions you have with your health care provider. Document Revised: 09/07/2017 Document Reviewed: 09/07/2017 Elsevier Patient Education  2020 [employer]  

## 2023-03-04 NOTE — H&P (Signed)
Patient seen and examined in preop holding.  No complaints. No changes to medication history or physical exam since last seen in clinic. Patient with bilateral lower extremity numbness, left foot worse than right.  This has been present for over 6 months. She is nonoperative candidate due to age and comorbidities After discussing the risks and benefits of bilateral lower extremity angiogram with emphasis on the left, [name] elected to proceed.   [name] MD   REASON FOR VISIT:   Follow-up of peripheral arterial disease with ulceration right leg.  MEDICAL ISSUES:   PERIPHERAL ARTERIAL DISEASE WITH ULCERATION: This patient has nonhealing wounds of both feet with evidence of infrainguinal arterial occlusive disease bilaterally.  This is clearly a limb threatening situation.  I have recommended arteriography in order to determine if she has any endovascular options for revascularization.  Her renal function has improved and her most recent labs in May showed a normal creatinine.  She does have significant cardiac issues and I have sent Dr. Cristal Deer a message to discuss proceeding with arteriography.  Given her age and comorbidities she would not be a candidate for open surgery.  However I think it would be reasonable to proceed with arteriography and possible endovascular intervention.  Without revascularization she is at high risk for limb loss.  I have reviewed with the patient the indications for arteriography. In addition, I have reviewed the potential complications of arteriography including but not limited to: Bleeding, arterial injury, arterial thrombosis, dye action, renal insufficiency, or other unpredictable medical problems. I have explained to the patient that if we find disease amenable to angioplasty we could potentially address this at the same time. I have discussed the potential complications of angioplasty and stenting, including but not limited to: Bleeding,  arterial thrombosis, arterial injury, dissection, or the need for surgical intervention.  [name] has atherosclerosis of the native arteries of the Bilateral lower extremities causing ulceration. The patient is on best medical therapy for peripheral arterial disease. The patient has been counseled about the risks of tobacco use in atherosclerotic disease. The patient has been counseled to abstain from any tobacco use. An aortogram with bilateral lower extremity runoff angiography and Bilateral lower extremity intervention and is indicated to better evaluate the patient's lower extremity circulation because of the limb threatening nature of the patient's diagnosis. Based on the patient's clinical exam and non-invasive data, we anticipate an endovascular intervention in the femoropopliteal and tibial vessels.  Stenting or  athrectomy would be favored because of the improved primary patency of these interventions as compared to plain balloon angioplasty.   HPI:   [name] is a pleasant 89 y.o. female who I saw a year ago on 02/06/2022 with peripheral arterial disease and an ulcer on her right leg.  She had a nonhealing wound of the right foot and evidence of tibial artery occlusive disease.  She was set up for an arteriogram but her creatinine was significantly elevated and this was subsequently canceled.  She was then lost to follow-up.  Of note she did get COVID and was in the hospital some with this.  She also had significant leg swelling which ultimately responded to diuresis.  She developed wounds on the right third toe and left second toe about 6 months ago.  About 4 to 5 months ago she got a wound on her left heel.  She has known infrainguinal arterial occlusive disease.  I do not get any history of  claudication although her activity is very limited.  She denies any history of rest pain.  Past Medical History:  Diagnosis Date   Anticoagulant long-term use    Arrhythmia    Arthritis     Atrial fibrillation (HCC)    Congestive heart failure (CHF) (HCC)    Dysphagia    Esophageal reflux    Essential hypertension    Hiatal hernia    Pulmonary embolus (HCC)    Thyroid disease     Family History  Problem Relation Age of Onset   Stroke Mother    Cancer Brother     SOCIAL HISTORY: Social History   Tobacco Use   Smoking status: Former    Packs/day: 1    Types: Cigarettes    Quit date: 10/02/2001    Years since quitting: 21.4   Smokeless tobacco: Never   Tobacco comments:    former smoker  Substance Use Topics   Alcohol use: Not on file    Allergies  Allergen Reactions   Amoxicillin Other (See Comments)    Unknown reaction  Tolerates Keflex, Cefepime   Azithromycin Other (See Comments)    Unknown reaction    Codeine Other (See Comments)    Unknown reaction    Erythromycin Other (See Comments)    Unknown reaction    Green Dyes Other (See Comments)    Allergic to ALL dyes   Iodine Other (See Comments)    Unknown reaction    Misc. Sulfonamide Containing Compounds    Oxycodone Other (See Comments)    Unknown reaction      Oxycodone-Acetaminophen Other (See Comments)    Unknown reaction    Penicillins Other (See Comments)    Unknown reaction  Tolerates Keflex, Cefepime   Sulfa Antibiotics     Per Pt's daughter - unknown reaction    Sulfasalazine     Unsure of allergy    Tramadol Hives    Current Facility-Administered Medications  Medication Dose Route Frequency Provider Last Rate Last Admin   0.9 %  sodium chloride infusion   Intravenous Continuous [name], MD 100 mL/hr at 03/04/23 1022 New Bag at 03/04/23 1022   Heparin (Porcine) in NaCl 1000-0.9 UT/500ML-% SOLN    PRN [name], MD   500 mL at 03/04/23 1349    REVIEW OF SYSTEMS:  [X]  denotes positive finding, [ ]  denotes negative finding Cardiac  Comments:  Chest pain or chest pressure:    Shortness of breath upon exertion: x   Short of breath when lying flat:     Irregular heart rhythm:        Vascular    Pain in calf, thigh, or hip brought on by ambulation:    Pain in feet at night that wakes you up from your sleep:     Blood clot in your veins:    Leg swelling:         Pulmonary    Oxygen at home:    Productive cough:     Wheezing:         Neurologic    Sudden weakness in arms or legs:     Sudden numbness in arms or legs:     Sudden onset of difficulty speaking or slurred speech:    Temporary loss of vision in one eye:     Problems with dizziness:         Gastrointestinal    Blood in stool:     Vomited blood:  Genitourinary    Burning when urinating:     Blood in urine:        Psychiatric    Major depression:         Hematologic    Bleeding problems:    Problems with blood clotting too easily:        Skin    Rashes or ulcers: x       Constitutional    Fever or chills:     PHYSICAL EXAM:   Vitals:   03/04/23 0959 03/04/23 1349 03/04/23 1350  BP: (!) 177/66    Pulse: (!) 55    Resp: 18    Temp: 97.7 F (36.5 C)    TempSrc: Oral    SpO2: 100% 90% 96%  Weight: 76.7 kg    Height: 5\' 4"  (1.626 m)      GENERAL: The patient is a well-nourished female, in no acute distress. The vital signs are documented above. CARDIAC: There is a regular rate and rhythm.  VASCULAR: I do not detect carotid bruits. She has palpable femoral pulses. I cannot palpate popliteal pulses. I cannot palpate pedal pulses. PULMONARY: There is good air exchange bilaterally without wheezing or rales. ABDOMEN: Soft and non-tender with normal pitched bowel sounds.  MUSCULOSKELETAL: There are no major deformities or cyanosis. NEUROLOGIC: No focal weakness or paresthesias are detected. SKIN: She has an ulcer on the right third toe as documented in the photograph below.  She has an ulcer on the left second toe and also on the heel as documented in the photographs below.     PSYCHIATRIC: The patient has a normal affect.  DATA:     LABS: I reviewed her labs from 01/21/2023.  Her creatinine was 0.94.  GFR was 57.  ARTERIAL DOPPLER STUDY: I reviewed the arterial Doppler study that was done on 11/04/2022.  On the right side she had a monophasic posterior tibial and dorsalis pedis signal.  ABI was 48%.  Toe pressure was 62 mmHg.  On the left side she had a monophasic posterior tibial and dorsalis pedis signal.  ABI was 46%.  Toe pressure was 34 mmHg.  ECHO: I reviewed her echo that was done on 12/19/2022.  Ejection fraction was 65 to 70%.  Of note she had severely elevated pulmonary artery systolic pressure.  [name] [facility] [phone]

## 2023-03-05 ENCOUNTER — Other Ambulatory Visit (INDEPENDENT_AMBULATORY_CARE_PROVIDER_SITE_OTHER): Payer: Self-pay | Admitting: Ophthalmology

## 2023-03-05 ENCOUNTER — Telehealth: Payer: Self-pay

## 2023-03-05 ENCOUNTER — Other Ambulatory Visit (HOSPITAL_BASED_OUTPATIENT_CLINIC_OR_DEPARTMENT_OTHER): Payer: Self-pay | Admitting: Family

## 2023-03-05 ENCOUNTER — Encounter (HOSPITAL_COMMUNITY): Payer: Self-pay | Admitting: Vascular Surgery

## 2023-03-05 ENCOUNTER — Telehealth: Payer: Self-pay | Admitting: Vascular Surgery

## 2023-03-05 DIAGNOSIS — I5032 Chronic diastolic (congestive) heart failure: Secondary | ICD-10-CM

## 2023-03-05 DIAGNOSIS — I272 Pulmonary hypertension, unspecified: Secondary | ICD-10-CM

## 2023-03-05 MED ORDER — IODIXANOL 320 MG/ML IV SOLN
INTRAVENOUS | Status: DC | PRN
  Administered 2023-03-04: 50 mL via INTRA_ARTERIAL

## 2023-03-05 NOTE — Telephone Encounter (Signed)
-----   Message from [name], MD sent at 03/04/2023  5:09 PM EDT ----- One month with ABI, follow up with [facility]

## 2023-03-05 NOTE — Telephone Encounter (Signed)
Pt's daughter, [name], called asking when to take the pt's bandage off.  Reviewed pt's chart, returned call for clarification, two identifiers used. Reviewed pt's d/c paperwork with her and provided clear wound care instructions. Confirmed understanding.

## 2023-03-10 ENCOUNTER — Encounter (HOSPITAL_BASED_OUTPATIENT_CLINIC_OR_DEPARTMENT_OTHER): Payer: Medicare HMO | Attending: General Surgery | Admitting: General Surgery

## 2023-03-10 DIAGNOSIS — I5032 Chronic diastolic (congestive) heart failure: Secondary | ICD-10-CM | POA: Diagnosis not present

## 2023-03-10 DIAGNOSIS — L97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 without evidence of necrosis: Secondary | ICD-10-CM | POA: Diagnosis not present

## 2023-03-10 DIAGNOSIS — I4891 Unspecified atrial fibrillation: Secondary | ICD-10-CM | POA: Insufficient documentation

## 2023-03-10 DIAGNOSIS — D649 Anemia, unspecified: Secondary | ICD-10-CM | POA: Diagnosis not present

## 2023-03-10 DIAGNOSIS — L97522 Non-pressure chronic ulcer of other part of left foot with fat layer exposed: Secondary | ICD-10-CM | POA: Diagnosis not present

## 2023-03-10 DIAGNOSIS — J449 Chronic obstructive pulmonary disease, unspecified: Secondary | ICD-10-CM | POA: Diagnosis not present

## 2023-03-10 DIAGNOSIS — Z87891 Personal history of nicotine dependence: Secondary | ICD-10-CM | POA: Diagnosis not present

## 2023-03-10 DIAGNOSIS — I872 Venous insufficiency (chronic) (peripheral): Secondary | ICD-10-CM | POA: Diagnosis not present

## 2023-03-10 DIAGNOSIS — L89623 Pressure ulcer of left heel, stage 3: Secondary | ICD-10-CM | POA: Diagnosis present

## 2023-03-10 DIAGNOSIS — L97526 Non-pressure chronic ulcer of other part of left foot with bone involvement without evidence of necrosis: Secondary | ICD-10-CM | POA: Diagnosis not present

## 2023-03-10 DIAGNOSIS — I130 Hypertensive heart and chronic kidney disease with heart failure and stage 1 through stage 4 chronic kidney disease, or unspecified chronic kidney disease: Secondary | ICD-10-CM | POA: Insufficient documentation

## 2023-03-10 DIAGNOSIS — N1832 Chronic kidney disease, stage 3b: Secondary | ICD-10-CM | POA: Diagnosis not present

## 2023-03-10 DIAGNOSIS — Z09 Encounter for follow-up examination after completed treatment for conditions other than malignant neoplasm: Secondary | ICD-10-CM | POA: Diagnosis not present

## 2023-03-10 DIAGNOSIS — Z7901 Long term (current) use of anticoagulants: Secondary | ICD-10-CM | POA: Diagnosis not present

## 2023-03-10 DIAGNOSIS — E039 Hypothyroidism, unspecified: Secondary | ICD-10-CM | POA: Diagnosis not present

## 2023-03-10 DIAGNOSIS — I739 Peripheral vascular disease, unspecified: Secondary | ICD-10-CM | POA: Insufficient documentation

## 2023-03-10 LAB — POCT ACTIVATED CLOTTING TIME
Activated Clotting Time: 263 seconds
Activated Clotting Time: 238 seconds
Activated Clotting Time: 281 seconds

## 2023-03-10 NOTE — Progress Notes (Signed)
[name] ([id number]) [id number] Page 1 of 13 Visit Report for 03/10/2023 Allergy List Details Patient Name: Date of Service: [name], [name] 03/10/2023 12:45 PM Medical Record Number: [id number] Patient Account Number: [id number] Date of Birth/Sex: Treating RN: [birth year] (89 y.o. [name] Primary Care Rolanda Campa: [name] Other Clinician: Referring Masie Bermingham: Treating Kamilo Och/Extender: Merceda Elks, Tereso Newcomer Weeks in Treatment: 0 Allergies Active Allergies penicillin erythromycin base green dyes Type: Food iodine sulfasalazine tramadol oxycodone codeine Allergy Notes Electronic Signature(s) Signed: 03/10/2023 3:06:27 PM By: Samuella Bruin Entered By: Samuella Bruin on 03/10/2023 12:36:38 -------------------------------------------------------------------------------- Arrival Information Details Patient Name: Date of Service: [name]. 03/10/2023 12:45 PM Medical Record Number: [id number] Patient Account Number: [id number] Date of Birth/Sex: Treating RN: 06/30/1928 (89 y.o. [name] Primary Care Roshawnda Pecora: [name] Other Clinician: Referring Keylani Perlstein: Treating Siarah Deleo/Extender: Claudie Leach Weeks in Treatment: 0 Visit Information Patient Arrived: Wheel Chair Arrival Time: 12:41 Accompanied By: daughter Transfer Assistance: Manual Patient Identification Verified: Yes Secondary Verification Process Completed: Yes Patient Has Alerts: Yes Patient Alerts: ABI R: 0.48 L: 0.46 2/24 TBI R: 0.39 L: 0.21 2/24 [name], [name] ([id number]) History Since Last Visit Added or deleted any medications: No Any new allergies or adverse reactions: No Had a fall or experienced change in activities of daily living that may affect risk of falls: No Signs or symptoms of abuse/neglect since last visito No Hospitalized since last visit: No Implantable device outside of the clinic excluding  cellular tissue based products placed in the center since last visit: No Pain Present Now: No [id number] Page 2 of 13 Electronic Signature(s) Signed: 03/10/2023 3:06:27 PM By: Samuella Bruin Entered By: Samuella Bruin on 03/10/2023 12:51:42 -------------------------------------------------------------------------------- Clinic Level of Care Assessment Details Patient Name: Date of Service: [name] 03/10/2023 12:45 PM Medical Record Number: [id number] Patient Account Number: [id number] Date of Birth/Sex: Treating RN: [birth year] (89 y.o. [name] Primary Care Tamecca Artiga: [name] Other Clinician: Referring Meliyah Simon: Treating Laquonda Welby/Extender: Claudie Leach Weeks in Treatment: 0 Clinic Level of Care Assessment Items TOOL 1 Quantity Score X- 1 0 Use when EandM and Procedure is performed on INITIAL visit ASSESSMENTS - Nursing Assessment / Reassessment X- 1 20 General Physical Exam (combine w/ comprehensive assessment (listed just below) when performed on new pt. evals) X- 1 25 Comprehensive Assessment (HX, ROS, Risk Assessments, Wounds Hx, etc.) ASSESSMENTS - Wound and Skin Assessment / Reassessment []  - 0 Dermatologic / Skin Assessment (not related to wound area) ASSESSMENTS - Ostomy and/or Continence Assessment and Care []  - 0 Incontinence Assessment and Management []  - 0 Ostomy Care Assessment and Management (repouching, etc.) PROCESS - Coordination of Care X - Simple Patient / Family Education for ongoing care 1 15 []  - 0 Complex (extensive) Patient / Family Education for ongoing care X- 1 10 Staff obtains Chiropractor, Records, T Results / Process Orders est []  - 0 Staff telephones HHA, Nursing Homes / Clarify orders / etc []  - 0 Routine Transfer to another Facility (non-emergent condition) []  - 0 Routine Hospital Admission (non-emergent condition) X- 1 15 New Admissions / Manufacturing engineer /  Ordering NPWT Apligraf, etc. , []  - 0 Emergency Hospital Admission (emergent condition) PROCESS - Special Needs []  - 0 Pediatric / Minor Patient Management []  - 0 Isolation Patient Management []  - 0 Hearing / Language / Visual special needs []  - 0 Assessment of Community assistance (transportation, D/C planning, etc.) []  - 0 Additional assistance / Altered mentation []  -  0 Support Surface(s) Assessment (bed, cushion, seat, etc.) INTERVENTIONS - Miscellaneous []  - 0 External ear exam []  - 0 Patient Transfer (multiple staff / Nurse, adult / Similar devices) []  - 0 Simple Staple / Suture removal (25 or less) []  - 0 Complex Staple / Suture removal (26 or more) [name], [name] ([id number]) [id number] Page 3 of 13 []  - 0 Hypo/Hyperglycemic Management (do not check if billed separately) []  - 0 Ankle / Brachial Index (ABI) - do not check if billed separately Has the patient been seen at the hospital within the last three years: Yes Total Score: 85 Level Of Care: New/Established - Level 3 Electronic Signature(s) Signed: 03/10/2023 3:06:27 PM By: Samuella Bruin Entered By: Samuella Bruin on 03/10/2023 13:34:42 -------------------------------------------------------------------------------- Encounter Discharge Information Details Patient Name: Date of Service: [name]. 03/10/2023 12:45 PM Medical Record Number: [id number] Patient Account Number: [id number] Date of Birth/Sex: Treating RN: [birth year] (89 y.o. [name] Primary Care Vick Filter: [name] Other Clinician: Referring Vivan Vanderveer: Treating Khi Mcmillen/Extender: Claudie Leach Weeks in Treatment: 0 Encounter Discharge Information Items Post Procedure Vitals Discharge Condition: Stable Temperature (F): 98.3 Ambulatory Status: Wheelchair Pulse (bpm): 75 Discharge Destination: Home Respiratory Rate (breaths/min): 18 Transportation: Private Auto Blood Pressure  (mmHg): 126/64 Accompanied By: daughter Schedule Follow-up Appointment: Yes Clinical Summary of Care: Patient Declined Electronic Signature(s) Signed: 03/10/2023 3:06:27 PM By: Samuella Bruin Entered By: Samuella Bruin on 03/10/2023 13:48:44 -------------------------------------------------------------------------------- Lower Extremity Assessment Details Patient Name: Date of Service: [name]. 03/10/2023 12:45 PM Medical Record Number: [id number] Patient Account Number: [id number] Date of Birth/Sex: Treating RN: Jan 02, 1928 (89 y.o. [name] Primary Care Tobie Hellen: [name] Other Clinician: Referring Jiles Goya: Treating Harjit Leider/Extender: Claudie Leach Weeks in Treatment: 0 Edema Assessment Assessed: [Left: No] [Right: No] [Left: Edema] [Right: :] Calf Left: Right: Point of Measurement: From Medial Instep 36.4 cm 38.5 cm Ankle Left: Right: Point of Measurement: From Medial Instep 21.8 cm 22 cm Vascular Assessment [name], [name] ([id number]) [Right:[id number] Page 4 of 13] Pulses: Dorsalis Pedis Palpable: [Left:Yes] [Right:Yes] Electronic Signature(s) Signed: 03/10/2023 3:06:27 PM By: Samuella Bruin Entered By: Samuella Bruin on 03/10/2023 13:02:27 -------------------------------------------------------------------------------- Multi Wound Chart Details Patient Name: Date of Service: [name]. 03/10/2023 12:45 PM Medical Record Number: [id number] Patient Account Number: [id number] Date of Birth/Sex: Treating RN: 1927/11/09 (89 y.o. F) Primary Care Hailey Miles: [name] Other Clinician: Referring Aizah Gehlhausen: Treating Detrice Cales/Extender: Claudie Leach Weeks in Treatment: 0 Vital Signs Height(in): Pulse(bpm): 75 Weight(lbs): Blood Pressure(mmHg): 126/64 Body Mass Index(BMI): Temperature(F): 98.3 Respiratory Rate(breaths/min): 18 [10:Photos:] Left T Second oe Right T  Third oe Left Calcaneus Wound Location: Pressure Injury Pressure Injury Pressure Injury Wounding Event: Pressure Ulcer Pressure Ulcer Pressure Ulcer Primary Etiology: Cataracts, Anemia, Arrhythmia, Cataracts, Anemia, Arrhythmia, Cataracts, Anemia, Arrhythmia, Comorbid History: Congestive Heart Failure, Congestive Heart Failure, Congestive Heart Failure, Hypotension, Peripheral Venous Hypotension, Peripheral Venous Hypotension, Peripheral Venous Disease, Osteomyelitis, Neuropathy Disease, Osteomyelitis, NeuropathyDisease, Osteomyelitis, Neuropathy 10/09/2022 10/09/2022 10/22/2022 Date Acquired: 0 0 0 Weeks of Treatment: Open Open Open Wound Status: No No No Wound Recurrence: 0.9x1x0.1 0.9x1x0.1 0.6x0.7x0.3 Measurements L x W x D (cm) 0.707 0.707 0.33 A (cm) : rea 0.071 0.071 0.099 Volume (cm) : 12 Starting Position 1 (o'clock): 12 Ending Position 1 (o'clock): 0.2 Maximum Distance 1 (cm): No No Yes Undermining: Category/Stage IV Category/Stage IV Category/Stage II Classification: None Present Medium Medium Exudate A mount: N/A Serosanguineous Serosanguineous Exudate Type: N/A red, brown red, brown Exudate Color: Distinct, outline attached Distinct,  outline attached Distinct, outline attached Wound Margin: None Present (0%) Large (67-100%) Small (1-33%) Granulation A mount: N/A Red, Pink Red Granulation Quality: Large (67-100%) Small (1-33%) Large (67-100%) Necrotic A mount: Eschar Eschar, Adherent Slough Adherent Slough Necrotic Tissue: Fat Layer (Subcutaneous Tissue): Yes Fat Layer (Subcutaneous Tissue): Yes Fat Layer (Subcutaneous Tissue): Yes Exposed Structures: Bone: Yes Bone: Yes Fascia: No Fascia: No Fascia: No Tendon: No Tendon: No Tendon: No Muscle: No Muscle: No Muscle: No Joint: No Joint: No Joint: No Bone: No Small (1-33%) None None Epithelialization: Debridement - Excisional Debridement - Selective/Open Wound Debridement -  Excisional Debridement: [name], [name] ([id number]) [id number] Page 5 of 13 13:26 13:26 13:26 Pre-procedure Verification/Time Out Taken: Lidocaine 4% Topical Solution Lidocaine 4% Topical Solution Lidocaine 4% Topical Solution Pain Control: Subcutaneous, Ambulance person, [employer], [employer] Tissue Debrided: Skin/Subcutaneous Tissue Non-Viable Tissue Skin/Subcutaneous Tissue Level: 0.71 0.71 0.33 Debridement A (sq cm): rea Curette Curette Curette Instrument: Minimum Minimum Minimum Bleeding: Pressure Pressure Pressure Hemostasis A chieved: Procedure was tolerated well Procedure was tolerated well Procedure was tolerated well Debridement Treatment Response: 0.9x1x0.1 0.9x1x0.1 0.6x0.7x0.3 Post Debridement Measurements L x W x D (cm) 0.071 0.071 0.099 Post Debridement Volume: (cm) Category/Stage IV Category/Stage IV Category/Stage II Post Debridement Stage: No Abnormalities Noted No Abnormalities Noted Callus: Yes Periwound Skin Texture: Dry/Scaly: Yes Dry/Scaly: Yes Dry/Scaly: Yes Periwound Skin Moisture: No Abnormalities Noted Rubor: Yes No Abnormalities Noted Periwound Skin Color: No Abnormality No Abnormality No Abnormality Temperature: Debridement Debridement Debridement Procedures Performed: Treatment Notes Wound #10 (Toe Second) Wound Laterality: Left Cleanser Soap and Water Discharge Instruction: May shower and wash wound with dial antibacterial soap and water prior to dressing change. Wound Cleanser Discharge Instruction: Cleanse the wound with wound cleanser prior to applying a clean dressing using gauze sponges, not tissue or cotton balls. Peri-Wound Care Topical Primary Dressing Promogran Prisma Matrix, 4.34 (sq in) (silver collagen) Discharge Instruction: Moisten collagen with saline or hydrogel Secondary Dressing Woven Gauze Sponge, Non-Sterile 4x4 in Discharge Instruction: Apply over primary dressing as  directed. Secured With 41M Medipore H Soft Cloth Surgical T ape, 4 x 10 (in/yd) Discharge Instruction: Secure with tape as directed. Compression Wrap Compression Stockings Add-Ons Wound #11 (Toe Third) Wound Laterality: Right Cleanser Soap and Water Discharge Instruction: May shower and wash wound with dial antibacterial soap and water prior to dressing change. Wound Cleanser Discharge Instruction: Cleanse the wound with wound cleanser prior to applying a clean dressing using gauze sponges, not tissue or cotton balls. Peri-Wound Care Topical Primary Dressing Promogran Prisma Matrix, 4.34 (sq in) (silver collagen) Discharge Instruction: Moisten collagen with saline or hydrogel Secondary Dressing Woven Gauze Sponge, Non-Sterile 4x4 in Discharge Instruction: Apply over primary dressing as directed. Secured With 41M Medipore H Soft Cloth Surgical T ape, 4 x 10 (in/yd) Discharge Instruction: Secure with tape as directed. Compression [name], [name] ([id number]) [id number] Page 6 of 13 Compression Stockings Add-Ons Wound #9 (Calcaneus) Wound Laterality: Left Cleanser Soap and Water Discharge Instruction: May shower and wash wound with dial antibacterial soap and water prior to dressing change. Wound Cleanser Discharge Instruction: Cleanse the wound with wound cleanser prior to applying a clean dressing using gauze sponges, not tissue or cotton balls. Peri-Wound Care Topical Primary Dressing Promogran Prisma Matrix, 4.34 (sq in) (silver collagen) Discharge Instruction: Moisten collagen with saline or hydrogel Secondary Dressing ALLEVYN Heel 4 1/2in x 5 1/2in / 10.5cm x 13.5cm Discharge Instruction: Apply over primary dressing as directed. Woven Gauze Sponge, Non-Sterile 4x4 in Discharge Instruction:  Apply over primary dressing as directed. Secured With 17M Medipore H Soft Cloth Surgical T ape, 4 x 10 (in/yd) Discharge Instruction: Secure with tape as  directed. Compression Wrap Compression Stockings Add-Ons Electronic Signature(s) Signed: 03/10/2023 2:19:52 PM By: Duanne Guess MD FACS Entered By: Duanne Guess on 03/10/2023 14:19:52 -------------------------------------------------------------------------------- Multi-Disciplinary Care Plan Details Patient Name: Date of Service: [name]. 03/10/2023 12:45 PM Medical Record Number: [id number] Patient Account Number: [id number] Date of Birth/Sex: Treating RN: 04-07-1928 (89 y.o. [name] Primary Care Demyah Smyre: [name] Other Clinician: Referring Floyd Lusignan: Treating Olimpia Tinch/Extender: Claudie Leach Weeks in Treatment: 0 Active Inactive Abuse / Safety / Falls / Self Care Management Nursing Diagnoses: Impaired home maintenance Impaired physical mobility Potential for falls Goals: Patient/caregiver will verbalize/demonstrate measure taken to improve self care Date Initiated: 03/10/2023 Target Resolution Date: 05/15/2023 Goal Status: Active Patient/caregiver will verbalize/demonstrate measures taken to improve the patient's personal safety Date Initiated: 03/10/2023 Target Resolution Date: 05/15/2023 GEOVANNA, ROCKMORE ([id number]) [id number] Page 7 of 13 Goal Status: Active Interventions: Assess fall risk on admission and as needed Assess: immobility, friction, shearing, incontinence upon admission and as needed Provide education on fall prevention Notes: Wound/Skin Impairment Nursing Diagnoses: Impaired tissue integrity Knowledge deficit related to ulceration/compromised skin integrity Goals: Patient/caregiver will verbalize understanding of skin care regimen Date Initiated: 03/10/2023 Target Resolution Date: 05/15/2023 Goal Status: Active Interventions: Assess patient/caregiver ability to obtain necessary supplies Assess patient/caregiver ability to perform ulcer/skin care regimen upon admission and as needed Assess  ulceration(s) every visit Treatment Activities: Skin care regimen initiated : 03/10/2023 Topical wound management initiated : 03/10/2023 Notes: Electronic Signature(s) Signed: 03/10/2023 3:06:27 PM By: Samuella Bruin Entered By: Samuella Bruin on 03/10/2023 13:21:15 -------------------------------------------------------------------------------- Pain Assessment Details Patient Name: Date of Service: [name]. 03/10/2023 12:45 PM Medical Record Number: [id number] Patient Account Number: [id number] Date of Birth/Sex: Treating RN: 07/20/1928 (89 y.o. [name] Primary Care Aviva Wolfer: [name] Other Clinician: Referring Rayson Rando: Treating Lijah Bourque/Extender: Claudie Leach Weeks in Treatment: 0 Active Problems Location of Pain Severity and Description of Pain Patient Has Paino No Site Locations Rate the pain. Current Pain Level: 0 Pain Management and Medication Westling, Stanley [name] ([id number]) [id number] Page 8 of 13 Current Pain Management: Electronic Signature(s) Signed: 03/10/2023 3:06:27 PM By: Gelene Mink By: Samuella Bruin on 03/10/2023 13:20:24 -------------------------------------------------------------------------------- Patient/Caregiver Education Details Patient Name: Date of Service: [name] 7/2/2024andnbsp12:45 PM Medical Record Number: [id number] Patient Account Number: [id number] Date of Birth/Gender: Treating RN: 1928-03-16 (89 y.o. [name] Primary Care Physician: [name] Other Clinician: Referring Physician: Treating Physician/Extender: Tiajuana Amass in Treatment: 0 Education Assessment Education Provided To: Patient Education Topics Provided Wound Debridement: Methods: Explain/Verbal Responses: Reinforcements needed, State content correctly Wound/Skin Impairment: Methods: Explain/Verbal Responses: Reinforcements needed, State  content correctly Electronic Signature(s) Signed: 03/10/2023 3:06:27 PM By: Samuella Bruin Entered By: Samuella Bruin on 03/10/2023 13:34:18 -------------------------------------------------------------------------------- Wound Assessment Details Patient Name: Date of Service: [name]. 03/10/2023 12:45 PM Medical Record Number: [id number] Patient Account Number: [id number] Date of Birth/Sex: Treating RN: [birth year] (89 y.o. [name] Primary Care Barak Bialecki: [name] Other Clinician: Referring Dazia Lippold: Treating Laurent Cargile/Extender: Claudie Leach Weeks in Treatment: 0 Wound Status Wound Number: 10 Primary Pressure Ulcer Etiology: Wound Location: Left T Second oe Wound Open Wounding Event: Pressure Injury Status: Date Acquired: 10/09/2022 Comorbid Cataracts, Anemia, Arrhythmia, Congestive Heart Failure, Weeks Of Treatment: 0 History: Hypotension, Peripheral Venous Disease, Osteomyelitis, Clustered Wound: No  Neuropathy Photos [name], [name] ([id number]) [id number] Page 9 of 13 Wound Measurements Length: (cm) 0.9 Width: (cm) 1 Depth: (cm) 0.1 Area: (cm) 0.707 Volume: (cm) 0.071 % Reduction in Area: % Reduction in Volume: Epithelialization: Small (1-33%) Tunneling: No Undermining: No Wound Description Classification: Category/Stage IV Wound Margin: Distinct, outline attached Exudate Amount: None Present Foul Odor After Cleansing: No Slough/Fibrino No Wound Bed Granulation Amount: None Present (0%) Exposed Structure Necrotic Amount: Large (67-100%) Fascia Exposed: No Necrotic Quality: Eschar Fat Layer (Subcutaneous Tissue) Exposed: Yes Tendon Exposed: No Muscle Exposed: No Joint Exposed: No Bone Exposed: Yes Periwound Skin Texture Texture Color No Abnormalities Noted: Yes No Abnormalities Noted: Yes Moisture Temperature / Pain No Abnormalities Noted: No Temperature: No Abnormality Dry / Scaly:  Yes Treatment Notes Wound #10 (Toe Second) Wound Laterality: Left Cleanser Soap and Water Discharge Instruction: May shower and wash wound with dial antibacterial soap and water prior to dressing change. Wound Cleanser Discharge Instruction: Cleanse the wound with wound cleanser prior to applying a clean dressing using gauze sponges, not tissue or cotton balls. Peri-Wound Care Topical Primary Dressing Promogran Prisma Matrix, 4.34 (sq in) (silver collagen) Discharge Instruction: Moisten collagen with saline or hydrogel Secondary Dressing Woven Gauze Sponge, Non-Sterile 4x4 in Discharge Instruction: Apply over primary dressing as directed. Secured With 7M Medipore H Soft Cloth Surgical T ape, 4 x 10 (in/yd) Discharge Instruction: Secure with tape as directed. Compression Wrap Compression Stockings Add-Ons Electronic Signature(s) [name], [name] ([id number]) [id number] Page 10 of 13 Signed: 03/10/2023 3:06:27 PM By: Samuella Bruin Entered By: Samuella Bruin on 03/10/2023 13:27:51 -------------------------------------------------------------------------------- Wound Assessment Details Patient Name: Date of Service: [name], [name] 03/10/2023 12:45 PM Medical Record Number: [id number] Patient Account Number: [id number] Date of Birth/Sex: Treating RN: 07/03/1928 (89 y.o. [name] Primary Care Roston Grunewald: [name] Other Clinician: Referring Chasyn Cinque: Treating Joelle Roswell/Extender: Claudie Leach Weeks in Treatment: 0 Wound Status Wound Number: 11 Primary Pressure Ulcer Etiology: Wound Location: Right T Third oe Wound Open Wounding Event: Pressure Injury Status: Date Acquired: 10/09/2022 Comorbid Cataracts, Anemia, Arrhythmia, Congestive Heart Failure, Weeks Of Treatment: 0 History: Hypotension, Peripheral Venous Disease, Osteomyelitis, Clustered Wound: No Neuropathy Photos Wound Measurements Length: (cm) 0.9 Width:  (cm) 1 Depth: (cm) 0.1 Area: (cm) 0.707 Volume: (cm) 0.071 % Reduction in Area: % Reduction in Volume: Epithelialization: None Tunneling: No Undermining: No Wound Description Classification: Category/Stage IV Wound Margin: Distinct, outline attached Exudate Amount: Medium Exudate Type: Serosanguineous Exudate Color: red, brown Foul Odor After Cleansing: No Slough/Fibrino Yes Wound Bed Granulation Amount: Large (67-100%) Exposed Structure Granulation Quality: Red, Pink Fascia Exposed: No Necrotic Amount: Small (1-33%) Fat Layer (Subcutaneous Tissue) Exposed: Yes Necrotic Quality: Eschar, Adherent Slough Tendon Exposed: No Muscle Exposed: No Joint Exposed: No Bone Exposed: Yes Periwound Skin Texture Texture Color No Abnormalities Noted: Yes No Abnormalities Noted: No Rubor: Yes Moisture No Abnormalities Noted: No Temperature / Pain Dry / Scaly: Yes Temperature: No Abnormality [name], [name] ([id number]) [id number] Page 11 of 13 Treatment Notes Wound #11 (Toe Third) Wound Laterality: Right Cleanser Soap and Water Discharge Instruction: May shower and wash wound with dial antibacterial soap and water prior to dressing change. Wound Cleanser Discharge Instruction: Cleanse the wound with wound cleanser prior to applying a clean dressing using gauze sponges, not tissue or cotton balls. Peri-Wound Care Topical Primary Dressing Promogran Prisma Matrix, 4.34 (sq in) (silver collagen) Discharge Instruction: Moisten collagen with saline or hydrogel Secondary Dressing Woven Gauze Sponge, Non-Sterile 4x4 in Discharge Instruction: Apply over  primary dressing as directed. Secured With 46M Medipore H Soft Cloth Surgical T ape, 4 x 10 (in/yd) Discharge Instruction: Secure with tape as directed. Compression Wrap Compression Stockings Add-Ons Electronic Signature(s) Signed: 03/10/2023 3:06:27 PM By: Samuella Bruin Entered By: Samuella Bruin on  03/10/2023 13:26:13 -------------------------------------------------------------------------------- Wound Assessment Details Patient Name: Date of Service: [name]. 03/10/2023 12:45 PM Medical Record Number: [id number] Patient Account Number: [id number] Date of Birth/Sex: Treating RN: [birth year] (89 y.o. [name] Primary Care Ivonne Freeburg: [name] Other Clinician: Referring Sosha Shepherd: Treating Shams Fill/Extender: Claudie Leach Weeks in Treatment: 0 Wound Status Wound Number: 9 Primary Pressure Ulcer Etiology: Wound Location: Left Calcaneus Wound Open Wounding Event: Pressure Injury Status: Date Acquired: 10/22/2022 Comorbid Cataracts, Anemia, Arrhythmia, Congestive Heart Failure, Weeks Of Treatment: 0 History: Hypotension, Peripheral Venous Disease, Osteomyelitis, Clustered Wound: No Neuropathy Photos [name], [name] ([id number]) [id number] Page 12 of 13 Wound Measurements Length: (cm) 0.6 Width: (cm) 0.7 Depth: (cm) 0.3 Area: (cm) 0.33 Volume: (cm) 0.099 % Reduction in Area: % Reduction in Volume: Epithelialization: None Tunneling: No Undermining: Yes Starting Position (o'clock): 12 Ending Position (o'clock): 12 Maximum Distance: (cm) 0.2 Wound Description Classification: Category/Stage II Wound Margin: Distinct, outline attached Exudate Amount: Medium Exudate Type: Serosanguineous Exudate Color: red, brown Foul Odor After Cleansing: No Slough/Fibrino Yes Wound Bed Granulation Amount: Small (1-33%) Exposed Structure Granulation Quality: Red Fascia Exposed: No Necrotic Amount: Large (67-100%) Fat Layer (Subcutaneous Tissue) Exposed: Yes Necrotic Quality: Adherent Slough Tendon Exposed: No Muscle Exposed: No Joint Exposed: No Bone Exposed: No Periwound Skin Texture Texture Color No Abnormalities Noted: No No Abnormalities Noted: Yes Callus: Yes Temperature / Pain Temperature: No  Abnormality Moisture No Abnormalities Noted: No Dry / Scaly: Yes Treatment Notes Wound #9 (Calcaneus) Wound Laterality: Left Cleanser Soap and Water Discharge Instruction: May shower and wash wound with dial antibacterial soap and water prior to dressing change. Wound Cleanser Discharge Instruction: Cleanse the wound with wound cleanser prior to applying a clean dressing using gauze sponges, not tissue or cotton balls. Peri-Wound Care Topical Primary Dressing Promogran Prisma Matrix, 4.34 (sq in) (silver collagen) Discharge Instruction: Moisten collagen with saline or hydrogel Secondary Dressing ALLEVYN Heel 4 1/2in x 5 1/2in / 10.5cm x 13.5cm Discharge Instruction: Apply over primary dressing as directed. Woven Gauze Sponge, Non-Sterile 4x4 in Discharge Instruction: Apply over primary dressing as directed. Secured With 46M Medipore H Soft Cloth Surgical T ape, 4 x 10 (in/yd) Discharge Instruction: Secure with tape as directed. Compression Wrap Compression Stockings Add-Ons Electronic Signature(s) Signed: 03/10/2023 3:06:27 PM By: Samuella Bruin Entered By: Samuella Bruin on 03/10/2023 13:13:23 Horst, [name] ([id number]) [id number] Page 13 of 13 -------------------------------------------------------------------------------- Vitals Details Patient Name: Date of Service: SHMYA, TATUM 03/10/2023 12:45 PM Medical Record Number: [id number] Patient Account Number: [id number] Date of Birth/Sex: Treating RN: Dec 25, 1927 (89 y.o. [name] Primary Care [place]: [name] Other Clinician: Referring Sundeep Destin: Treating Maya Arcand/Extender: Claudie Leach Weeks in Treatment: 0 Vital Signs Time Taken: 12:50 Temperature (F): 98.3 Pulse (bpm): 75 Respiratory Rate (breaths/min): 18 Blood Pressure (mmHg): 126/64 Reference Range: 80 - 120 mg / dl Electronic Signature(s) Signed: 03/10/2023 3:06:27 PM By: Samuella Bruin Entered By: Samuella Bruin on 03/10/2023 12:50:54

## 2023-03-10 NOTE — Progress Notes (Addendum)
TIONNI, BAAB ([id number]) [id number] Page 1 of 14 Visit Report for 03/10/2023 Chief Complaint Document Details Patient Name: Date of Service: [name], [name] 03/10/2023 12:45 PM Medical Record Number: [id number] Patient Account Number: [id number] Date of Birth/Sex: Treating RN: [birth year] (89 y.o. F) Primary Care Provider: [name] Other Clinician: Referring Provider: Treating Provider/Extender: Claudie Leach Weeks in Treatment: 0 Information Obtained from: Patient Chief Complaint 01/28/2022; right second toe amputation site dehiscence and bilateral lower extremity wounds. 03/10/2023: pressure ulcers of right heel, ulcers on toes Electronic Signature(s) Signed: 03/10/2023 2:20:23 PM By: Duanne Guess MD FACS Previous Signature: 03/10/2023 12:43:02 PM Version By: Duanne Guess MD FACS Entered By: Duanne Guess on 03/10/2023 14:20:23 -------------------------------------------------------------------------------- Debridement Details Patient Name: Date of Service: [name] 03/10/2023 12:45 PM Medical Record Number: [id number] Patient Account Number: [id number] Date of Birth/Sex: Treating RN: 01/21/1928 (89 y.o. [name] Primary Care Provider: [name] Other Clinician: Referring Provider: Treating Provider/Extender: Claudie Leach Weeks in Treatment: 0 Debridement Performed for Assessment: Wound #11 Right T Third oe Performed By: Physician Duanne Guess, MD Debridement Type: Debridement Level of Consciousness (Pre-procedure): Awake and Alert Pre-procedure Verification/Time Out Yes - 13:26 Taken: Start Time: 13:26 Pain Control: Lidocaine 4% Topical Solution Percent of Wound Bed Debrided: 100% T Area Debrided (cm): otal 0.71 Tissue and other material debrided: Non-Viable, Eschar, Slough, Slough Level: Non-Viable Tissue Debridement Description: Selective/Open Wound Instrument:  Curette Bleeding: Minimum Hemostasis Achieved: Pressure Response to Treatment: Procedure was tolerated well Level of Consciousness (Post- Awake and Alert procedure): Post Debridement Measurements of Total Wound Length: (cm) 0.9 Stage: Category/Stage IV Width: (cm) 1 Depth: (cm) 0.1 Volume: (cm) 0.071 Character of Wound/Ulcer Post Debridement: Improved Post Procedure Diagnosis [name], [name] ([id number]) [id number] Page 2 of 14 Same as Pre-procedure Notes scribed for Dr. Lady Gary by Samuella Bruin, RN Electronic Signature(s) Signed: 03/10/2023 3:06:27 PM By: Samuella Bruin Signed: 03/10/2023 4:21:51 PM By: Duanne Guess MD FACS Entered By: Samuella Bruin on 03/10/2023 13:27:01 -------------------------------------------------------------------------------- Debridement Details Patient Name: Date of Service: [name] 03/10/2023 12:45 PM Medical Record Number: [id number] Patient Account Number: [id number] Date of Birth/Sex: Treating RN: [birth year] (89 y.o. [name] Primary Care Provider: [name] Other Clinician: Referring Provider: Treating Provider/Extender: Claudie Leach Weeks in Treatment: 0 Debridement Performed for Assessment: Wound #10 Left T Second oe Performed By: Physician Duanne Guess, MD Debridement Type: Debridement Level of Consciousness (Pre-procedure): Awake and Alert Pre-procedure Verification/Time Out Yes - 13:26 Taken: Start Time: 13:26 Pain Control: Lidocaine 4% T opical Solution Percent of Wound Bed Debrided: 100% T Area Debrided (cm): otal 0.71 Tissue and other material debrided: Non-Viable, Slough, Subcutaneous, Slough Level: Skin/Subcutaneous Tissue Debridement Description: Excisional Instrument: Curette Bleeding: Minimum Hemostasis Achieved: Pressure Response to Treatment: Procedure was tolerated well Level of Consciousness (Post- Awake and Alert procedure): Post  Debridement Measurements of Total Wound Length: (cm) 0.9 Stage: Category/Stage IV Width: (cm) 1 Depth: (cm) 0.1 Volume: (cm) 0.071 Character of Wound/Ulcer Post Debridement: Improved Post Procedure Diagnosis Same as Pre-procedure Notes scribed for Dr. Lady Gary by Samuella Bruin, RN Electronic Signature(s) Signed: 03/10/2023 3:06:27 PM By: Samuella Bruin Signed: 03/10/2023 4:21:51 PM By: Duanne Guess MD FACS Entered By: Samuella Bruin on 03/10/2023 13:28:51 Debridement Details -------------------------------------------------------------------------------- [name] ([id number]) [id number] Page 3 of 14 Patient Name: Date of Service: [name], [name] 03/10/2023 12:45 PM Medical Record Number: [id number] Patient Account Number: [id number] Date of Birth/Sex: Treating RN: Oct 07, 1927 (89 y.o. [name] Primary  Care Provider: [name] Other Clinician: Referring Provider: Treating Provider/Extender: Claudie Leach Weeks in Treatment: 0 Debridement Performed for Assessment: Wound #9 Left Calcaneus Performed By: Physician Duanne Guess, MD Debridement Type: Debridement Level of Consciousness (Pre-procedure): Awake and Alert Pre-procedure Verification/Time Out Yes - 13:26 Taken: Start Time: 13:26 Pain Control: Lidocaine 4% T opical Solution Percent of Wound Bed Debrided: 100% T Area Debrided (cm): otal 0.33 Tissue and other material debrided: Non-Viable, Slough, Subcutaneous, Slough Level: Skin/Subcutaneous Tissue Debridement Description: Excisional Instrument: Curette Bleeding: Minimum Hemostasis Achieved: Pressure Response to Treatment: Procedure was tolerated well Level of Consciousness (Post- Awake and Alert procedure): Post Debridement Measurements of Total Wound Length: (cm) 0.6 Stage: Category/Stage II Width: (cm) 0.7 Depth: (cm) 0.3 Volume: (cm) 0.099 Character of Wound/Ulcer Post Debridement:  Improved Post Procedure Diagnosis Same as Pre-procedure Notes scribed for Dr. Lady Gary by Samuella Bruin, RN Electronic Signature(s) Signed: 03/10/2023 3:06:27 PM By: Samuella Bruin Signed: 03/10/2023 4:21:51 PM By: Duanne Guess MD FACS Entered By: Samuella Bruin on 03/10/2023 13:32:43 -------------------------------------------------------------------------------- HPI Details Patient Name: Date of Service: [name] 03/10/2023 12:45 PM Medical Record Number: [id number] Patient Account Number: [id number] Date of Birth/Sex: Treating RN: [birth year] (89 y.o. F) Primary Care Provider: [name] Other Clinician: Referring Provider: Treating Provider/Extender: Claudie Leach Weeks in Treatment: 0 History of Present Illness HPI Description: Admission 01/28/2022 Ms. 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 She states she has tried Medihoney and silver alginate in the past to this area with little benefit. She also has 2 small areas limited to skin breakdown to her lower extremities bilaterally. She has chronic venous insufficiency but not has not been wearing her compression stockings. She states she bumped her legs against an object and not so the wound started. She has been using Medihoney to the sites. She denies signs of infection. 6/2; patient presents for follow-up. She had an x-ray of her right foot done at last clinic visit and this was negative for evidence of osteomyelitis. She also had a wound culture done that showed extra high levels of Staph aureus. I recommended Keystone antibiotics for this and this was ordered. She had ABIs with TBI's done as well that showed  monophasic waveforms to the right foot with TBI of 0 and an ABI of 0.52. Urgent referral was made to vein and vascular and she saw Dr. Durwin Nora on 6/1, yesterday and he recommended an arteriogram. This is scheduled for 6/16. Patient also reports a new wound to the right great toe. This is a blister that has ruptured. She also reports increased erythema to the toe. ARWILDA, ZELMER ([id number]) [id number] Page 4 of 14 6/6; the patient was worked in urgently today at the insistence of her daughter out of concern for a new wound on the lateral part of the plantar right great toe. She has her original postsurgical wound after the amputation of the right second toe, she has a wound on the medial part of the right great toe. The patient is apparently going for an angiogram by Dr. Durwin Nora in 2 weeks time. 6/13; patient presents for follow-up. She has been using bacitracin to the abrasion on the right great toe. She has been using collagen and [address]  antibiotic to the amputation site. She has no issues or complaints today. She denies signs of infection. 6/22; patient presents for follow-up. She states that her abdominal aortogram was canceled due to her renal function. She has been using Keystone antibiotics to the amputation site and Medihoney to the right great toe wound. At the pace of the right great toe she has a slitlike open area that she thinks was caused by the tape from the dressing. 6/29; patient presents for follow-up. She has been using Keystone antibiotics to the amputation site along with collagen. She has been using Medihoney to the right great toe wound. She has no other wounds. She denies signs of infection. 7/13; patient presents for follow-up. She has been using Keystone antibiotics and collagen to the wound sites. She currently denies signs of infection. She states she is scheduled to see me nephrology next month. 7/20; patient presents for follow-up. She continue  Keystone antibiotics and collagen to the wound sites. She has no issues or complaints today. 8/1; patient presents for follow up. She continues to use keystone antibiotics and collagen to the wound sites. She has no issues or complaints today. 8/15; patient presents for follow-up. She has been using Keystone antibiotics and collagen to the wound sites. She followed up with her nephrologist who made medication changes. She is supposed to get a repeat BMP in 2 weeks. Her decrease in renal function was a limiting factor in obtaining an arteriogram for potential intervention for revascularization. She currently denies signs of infection. 9/2; patient presents for follow-up. She has been using Keystone antibiotic and collagen to the wound beds. She has no issues or complaints today. Reports there has been improvement in kidney function however not cleared to have her arteriogram just yet. 10/10; patient presents for follow-up. She has been using Keystone antibiotic and collagen to the wound beds. She reports 2 new wounds 1 to the anterior right lower extremity and another to the plantar aspect of the right foot. She states that the right plantar foot wound was caused by the home health nurse changing the dressing and causing a skin tear. She is not sure how the right anterior leg wound started. It appears to be from trauma. She denies signs of infection. 10/19; patient presents for follow-up. She has been using Keystone antibiotic and collagen to the right great toe wound. She is been using silver alginate to the right anterior and right plantar foot wound. She has been using Tubigrip to the right lower extremity. The plantar foot wound has healed. She has no issues or complaints today. 11/3; since the patient was last here she was seen in urgent care apparently for an area on the dorsal aspect of the right fifth toe perhaps over the PIP. I saw a picture of this on the daughter's cell phone. There was  slough on this. Urgent care gave him doxycycline. She is also changed the dressing to all wounds back the [facility] and collagen which includes her right leg and left first toe 11/16; patient presents for follow-up. She has a new wound to the left knee. She states she fell. She has been using antibiotic ointment and collagen to this area. She has been using collagen and Keystone antibiotic to the right great toe wound. The anterior right leg wound is healed. She denies signs of infection. 11/30; patient presents for follow-up. The right great toe wound has healed. She has 1 remaining wound to the left knee. She has been using [employer]	[place]  and Medihoney here. 12/19; patient presents for follow-up. Her left knee wound has healed. She has no issues or complaints today. 09/30/2022 Patient's daughter called for an appointment due to increased swelling to her lower extremities bilaterally. Today patient presents with increased swelling to her lower extremities although there is no increased redness or warmth. She was evaluated by her nephrologist who increased her diuretics. Per patient and daughter her swelling has gone down to the leg Over the past several days. She does not have any open wounds. She was advised to elevate her legs and not consume excess salt By her PCP and nephrologist. Patient has compression stockings that she has been using sporadically. READMISSION 03/10/2023 Since her last visit to the clinic, the patient has been hospitalized at least twice, in February with COVID pneumonia and CHF exacerbation. She was discharged to a skilled nursing facility and then readmitted in April with fevers. She was noted to have pressure ulcers on her heels and sacrum. Per family declined skilled nursing placement upon discharge and she has apparently been residing at home. She has followed with the vascular surgery clinic regarding peripheral artery disease and due to ulcerations on her feet, she  ultimately underwent arteriography with balloon angioplasties of the superficial femoral artery, popliteal artery and peroneal artery on the left, along with shockwave ultrasound assisted balloon angioplasty of the popliteal artery and distal SFA. Given her multiple significant medical comorbidities, she is not a candidate for open surgery. She is deemed to be maximally vascularized this procedure, which was performed on 04 March 2023. T oday, she has a stage III pressure ulcer on her left heel and wounds on the PIP joints of her right third and left second toes. No sacral wound is present and the ulcer on her left heel has closed. Electronic Signature(s) Signed: 03/10/2023 2:21:29 PM By: Duanne Guess MD FACS Previous Signature: 03/10/2023 12:48:47 PM Version By: Duanne Guess MD FACS Entered By: Duanne Guess on 03/10/2023 14:21:29 -------------------------------------------------------------------------------- Physical Exam Details Patient Name: Date of Service: [name] 03/10/2023 12:45 PM Medical Record Number: [id number] Patient Account Number: [id number] Date of Birth/Sex: Treating RN: 1928-05-10 (89 y.o. F) Primary Care Provider: [name] Other Clinician: Referring Provider: Treating Provider/Extender: Claudie Leach Weeks in Treatment: 0 Gille, [name] ([id number]) [id number] Page 5 of 14 Constitutional . . . . Appears chronically ill but in no acute distress. Respiratory Normal work of breathing on supplemental oxygen. Notes 03/10/2023: On the dorsal surface of the PIP joint on her left second and right third toes, there are circular ulcerations suggestive of a friction-induced injury. These are covered with slough, underneath which bone is exposed. On her left heel, there is a clean circular ulcer consistent with a stage III pressure ulceration. There is some slough accumulation here, as well with callus at the edges of the  wound. Electronic Signature(s) Signed: 03/10/2023 3:02:56 PM By: Duanne Guess MD FACS Previous Signature: 03/10/2023 2:21:48 PM Version By: Duanne Guess MD FACS Entered By: Duanne Guess on 03/10/2023 15:02:55 -------------------------------------------------------------------------------- Physician Orders Details Patient Name: Date of Service: [name] 03/10/2023 12:45 PM Medical Record Number: [id number] Patient Account Number: [id number] Date of Birth/Sex: Treating RN: Apr 26, 1928 (89 y.o. 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 - Dr. Lady Gary - room 2 Return A Anesthetic (In clinic) Topical Lidocaine 4% applied to wound bed Bathing/ Shower/ Hygiene May shower and wash wound with soap and water. Edema Control - Lymphedema / SCD / Other Elevate legs to the level of the heart or above for 30 minutes daily and/or when sitting for 3-4 times a day throughout the day. Avoid standing for long periods of time. Patient to wear own compression stockings every day. Additional Orders / Instructions Follow Nutritious Diet - add in protein shakes every day to diet - recommend premier protein 500 mg x3 a day vitamin C, zinc 30-50 mg per day Home Health New wound care orders  this week; continue Home Health for wound care. May utilize formulary equivalent dressing for wound treatment orders unless otherwise specified. Dressing changes to be completed by Home Health on Monday / Wednesday / Friday except when patient has scheduled visit at [facility]. Other Home Health Orders/Instructions: - [facility] Wound Treatment Wound #10 - T Second oe Wound Laterality: Left Cleanser: Soap and Water 1 x Per Day/30 Days Izzo, [name] ([id number]) [id number] Page 6 of 14 Discharge Instructions: May shower and wash wound with dial antibacterial soap and water prior to dressing change. Cleanser: Wound Cleanser 1 x Per Day/30 Days Discharge Instructions: Cleanse the wound with wound cleanser prior to applying a clean dressing using gauze sponges, not tissue or cotton balls. Prim Dressing: Promogran Prisma Matrix, 4.34 (sq in) (silver collagen) (DME) (Generic) 1 x Per Day/30 Days ary Discharge Instructions: Moisten collagen with saline or hydrogel Secondary Dressing: Woven Gauze Sponge, Non-Sterile 4x4 in (DME) (Generic) 1 x Per Day/30 Days Discharge Instructions: Apply over primary dressing as directed. Secured With: 1M Medipore H Soft Cloth Surgical T ape, 4 x 10 (in/yd) (DME) (Generic) 1 x Per Day/30 Days Discharge Instructions: Secure with tape as directed. Wound #11 - T Third oe Wound Laterality: Right Cleanser: Soap and Water 1 x Per Day/30 Days Discharge Instructions: May shower and wash wound with dial antibacterial soap and water prior to dressing change. Cleanser: Wound Cleanser 1 x Per Day/30 Days Discharge Instructions: Cleanse the wound with wound cleanser prior to applying a clean dressing using gauze sponges, not tissue or cotton balls. Prim Dressing: Promogran Prisma Matrix, 4.34 (sq in) (silver collagen) (DME) (Generic) 1 x Per Day/30 Days ary Discharge Instructions: Moisten collagen with saline or hydrogel Secondary Dressing:  Woven Gauze Sponge, Non-Sterile 4x4 in (DME) (Generic) 1 x Per Day/30 Days Discharge Instructions: Apply over primary dressing as directed. Secured With: 1M Medipore H Soft Cloth Surgical T ape, 4 x 10 (in/yd) (DME) (Generic) 1 x Per Day/30 Days Discharge Instructions: Secure with tape as directed. Wound #9 - Calcaneus Wound Laterality: Left Cleanser: Soap and Water 1 x Per Day/30 Days Discharge Instructions: May shower and wash wound with dial antibacterial soap and water prior to dressing change. Cleanser: Wound Cleanser 1 x Per Day/30 Days Discharge Instructions: Cleanse the wound with wound cleanser prior to applying a clean dressing using gauze sponges, not tissue or cotton balls. Prim Dressing: Promogran Prisma Matrix, 4.34 (sq in) (silver collagen) (DME) (Generic) 1 x Per Day/30 Days ary  Discharge Instructions: Moisten collagen with saline or hydrogel Secondary Dressing: ALLEVYN Heel 4 1/2in x 5 1/2in / 10.5cm x 13.5cm (DME) (Generic) 1 x Per Day/30 Days Discharge Instructions: Apply over primary dressing as directed. Secondary Dressing: Woven Gauze Sponge, Non-Sterile 4x4 in (DME) (Generic) 1 x Per Day/30 Days Discharge Instructions: Apply over primary dressing as directed. Secured With: 93M Medipore H Soft Cloth Surgical T ape, 4 x 10 (in/yd) (DME) (Generic) 1 x Per Day/30 Days Discharge Instructions: Secure with tape as directed. Patient Medications llergies: penicillin, erythromycin base, green dyes, iodine, sulfasalazine, tramadol, oxycodone, codeine A Notifications Medication Indication Start End 03/10/2023 lidocaine DOSE topical 4 % cream - cream topical Electronic Signature(s) Signed: 03/11/2023 4:16:02 PM By: Samuella Bruin Signed: 03/11/2023 4:41:42 PM By: Duanne Guess MD FACS Previous Signature: 03/10/2023 4:21:51 PM Version By: Duanne Guess MD FACS Entered By: Samuella Bruin on 03/11/2023 13:39:50 Problem List  Details -------------------------------------------------------------------------------- [name] ([id number]) [id number] Page 7 of 14 Patient Name: Date of Service: [name], [name] 03/10/2023 12:45 PM Medical Record Number: [id number] Patient Account Number: [id number] Date of Birth/Sex: Treating RN: 1928/06/27 (89 y.o. F) Primary Care Provider: [name] Other Clinician: Referring Provider: Treating Provider/Extender: Claudie Leach Weeks in Treatment: 0 Active Problems ICD-10 Encounter Code Description Active Date MDM Diagnosis [phone] Pressure ulcer of left heel, stage 3 03/10/2023 No Yes 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 03/10/2023 No Yes without evidence of necrosis 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 03/10/2023 No Yes without evidence of necrosis I73.9 Peripheral vascular disease, unspecified 03/10/2023 No Yes I50.32 Chronic diastolic (congestive) heart failure 03/10/2023 No Yes N18.32 Chronic kidney disease, stage 3b 03/10/2023 No Yes I87.2 Venous insufficiency (chronic) (peripheral) 03/10/2023 No Yes Inactive Problems Resolved Problems Electronic Signature(s) Signed: 03/10/2023 2:19:45 PM By: Duanne Guess MD FACS Previous Signature: 03/10/2023 2:19:07 PM Version By: Duanne Guess MD FACS Previous Signature: 03/10/2023 12:41:02 PM Version By: Duanne Guess MD FACS Entered By: Duanne Guess on 03/10/2023 14:19:45 -------------------------------------------------------------------------------- Progress Note Details Patient Name: Date of Service: [name] 03/10/2023 12:45 PM Medical Record Number: [id number] Patient Account Number: [id number] Date of Birth/Sex: Treating RN: 1927/10/05 (89 y.o. F) Primary Care Provider: [name] Other Clinician: Referring Provider: Treating Provider/Extender: Claudie Leach Weeks in Treatment: 0 Subjective Chief Complaint Information obtained from Patient 01/28/2022; right second toe amputation site dehiscence and bilateral lower extremity wounds. 03/10/2023: pressure ulcers of right heel, ulcers on toes [name], [name] ([id number]) [id number] Page 8 of 14 History of Present Illness (HPI) Admission 01/28/2022 Ms. 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 She states she has tried Medihoney and silver alginate in the past to this area with little benefit. She also has 2 small areas limited to skin breakdown to her lower extremities bilaterally. She has chronic venous insufficiency but not has not been wearing her compression stockings. She states she bumped her legs against an object and not so the wound started. She has been using Medihoney to the sites. She denies signs of infection. 6/2; patient presents for follow-up. She had an x-ray of her right foot done at last clinic visit and this was  negative for evidence of osteomyelitis. She also had a wound culture done that showed extra high levels of Staph aureus. I recommended Keystone antibiotics for this and this was ordered. She had ABIs with TBI's done as well that showed monophasic waveforms to the right foot with TBI of 0 and an ABI of 0.52. Urgent referral was made to vein and vascular and she saw Dr. Durwin Nora on 6/1, yesterday and he recommended an arteriogram. This is scheduled for 6/16. Patient also reports a new wound to the right great toe. This is a blister that has ruptured. She also reports increased erythema to the toe. 6/6; the patient was worked in urgently today at the insistence of her daughter  out of concern for a new wound on the lateral part of the plantar right great toe. She has her original postsurgical wound after the amputation of the right second toe, she has a wound on the medial part of the right great toe. The patient is apparently going for an angiogram by Dr. Durwin Nora in 2 weeks time. 6/13; patient presents for follow-up. She has been using bacitracin to the abrasion on the right great toe. She has been using collagen and Keystone antibiotic to the amputation site. She has no issues or complaints today. She denies signs of infection. 6/22; patient presents for follow-up. She states that her abdominal aortogram was canceled due to her renal function. She has been using Keystone antibiotics to the amputation site and Medihoney to the right great toe wound. At the pace of the right great toe she has a slitlike open area that she thinks was caused by the tape from the dressing. 6/29; patient presents for follow-up. She has been using Keystone antibiotics to the amputation site along with collagen. She has been using Medihoney to the right great toe wound. She has no other wounds. She denies signs of infection. 7/13; patient presents for follow-up. She has been using Keystone antibiotics and collagen to the wound sites. She currently denies signs of infection. She states she is scheduled to see me nephrology next month. 7/20; patient presents for follow-up. She continue Keystone antibiotics and collagen to the wound sites. She has no issues or complaints today. 8/1; patient presents for follow up. She continues to use keystone antibiotics and collagen to the wound sites. She has no issues or complaints today. 8/15; patient presents for follow-up. She has been using Keystone antibiotics and collagen to the wound sites. She followed up with her nephrologist who made medication changes. She is supposed to get a repeat BMP in 2 weeks. Her decrease in renal function was a limiting factor in  obtaining an arteriogram for potential intervention for revascularization. She currently denies signs of infection. 9/2; patient presents for follow-up. She has been using Keystone antibiotic and collagen to the wound beds. She has no issues or complaints today. Reports there has been improvement in kidney function however not cleared to have her arteriogram just yet. 10/10; patient presents for follow-up. She has been using Keystone antibiotic and collagen to the wound beds. She reports 2 new wounds 1 to the anterior right lower extremity and another to the plantar aspect of the right foot. She states that the right plantar foot wound was caused by the home health nurse changing the dressing and causing a skin tear. She is not sure how the right anterior leg wound started. It appears to be from trauma. She denies signs of infection. 10/19; patient presents for follow-up. She has been  using Keystone antibiotic and collagen to the right great toe wound. She is been using silver alginate to the right anterior and right plantar foot wound. She has been using Tubigrip to the right lower extremity. The plantar foot wound has healed. She has no issues or complaints today. 11/3; since the patient was last here she was seen in urgent care apparently for an area on the dorsal aspect of the right fifth toe perhaps over the PIP. I saw a picture of this on the daughter's cell phone. There was slough on this. Urgent care gave him doxycycline. She is also changed the dressing to all wounds back the [facility] and collagen which includes her right leg and left first toe 11/16; patient presents for follow-up. She has a new wound to the left knee. She states she fell. She has been using antibiotic ointment and collagen to this area. She has been using collagen and Keystone antibiotic to the right great toe wound. The anterior right leg wound is healed. She denies signs of infection. 11/30; patient presents for  follow-up. The right great toe wound has healed. She has 1 remaining wound to the left knee. She has been using Hydrofera Blue and Medihoney here. 12/19; patient presents for follow-up. Her left knee wound has healed. She has no issues or complaints today. 09/30/2022 Patient's daughter called for an appointment due to increased swelling to her lower extremities bilaterally. Today patient presents with increased swelling to her lower extremities although there is no increased redness or warmth. She was evaluated by her nephrologist who increased her diuretics. Per patient and daughter her swelling has gone down to the leg Over the past several days. She does not have any open wounds. She was advised to elevate her legs and not consume excess salt By her PCP and nephrologist. Patient has compression stockings that she has been using sporadically. READMISSION 03/10/2023 Since her last visit to the clinic, the patient has been hospitalized at least twice, in February with COVID pneumonia and CHF exacerbation. She was discharged to a skilled nursing facility and then readmitted in April with fevers. She was noted to have pressure ulcers on her heels and sacrum. Per family declined skilled nursing placement upon discharge and she has apparently been residing at home. She has followed with the vascular surgery clinic regarding peripheral artery disease and due to ulcerations on her feet, she ultimately underwent arteriography with balloon angioplasties of the superficial femoral artery, popliteal artery and peroneal artery on the left, along with shockwave ultrasound assisted balloon angioplasty of the popliteal artery and distal SFA. Given her multiple significant medical comorbidities, she is not a candidate for open surgery. She is deemed to be maximally vascularized this procedure, which was performed on 04 March 2023. T oday, she has a stage III pressure ulcer on her left heel and wounds on the PIP joints  of her right third and left second toes. No sacral wound is present and the ulcer on her left heel has closed. Patient History Information obtained from Patient, Caregiver, Chart. Allergies penicillin, erythromycin base, green dyes, iodine, sulfasalazine, tramadol, oxycodone, codeine Family History Cancer - Siblings, Heart Disease, Stroke - Mother, [name], [name] ([id number]) [id number] Page 9 of 14 No family history of Diabetes. Social History Former smoker - quit 2003, Marital Status - Widowed, Alcohol Use - Never, Drug Use - No History, Caffeine Use - Never. Medical History Eyes Patient has history of Cataracts Hematologic/Lymphatic Patient has history of Anemia Respiratory Denies history of  Chronic Obstructive Pulmonary Disease (COPD) Cardiovascular Patient has history of Arrhythmia - a-fib, Congestive Heart Failure, Hypotension, Peripheral Venous Disease Musculoskeletal Patient has history of 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 Neurologic Patient has history of Neuropathy Hospitalization/Surgery History - Abdominal aortogram w/lower extremity. - Amputation toe (Right). - Colonoscopy. - Elbow surgery. - Laparoscopic hysterectomy. Medical A Surgical History Notes nd Hematologic/Lymphatic Thrombocytopenia, Anticoagulant long-term use Respiratory Pulmonary embolus Gastrointestinal Hiatal hernia Endocrine Hyperthyroidism, Hypothyroidism Genitourinary CKD stage III Musculoskeletal arthritis Review of Systems (ROS) Constitutional Symptoms (General Health) Denies complaints or symptoms of Fatigue, Fever, Chills, Marked Weight Change. Ear/Nose/Mouth/Throat Denies complaints or symptoms of Chronic sinus problems or rhinitis. Integumentary (Skin) Complains or has symptoms of Wounds. Psychiatric Denies complaints or symptoms of Claustrophobia. Objective Constitutional Appears chronically ill but in no acute distress. Vitals Time  Taken: 12:50 PM, Temperature: 98.3 F, Pulse: 75 bpm, Respiratory Rate: 18 breaths/min, Blood Pressure: 126/64 mmHg. Respiratory Normal work of breathing on supplemental oxygen. General Notes: 03/10/2023: On the dorsal surface of the PIP joint on her left second and right third toes, there are circular ulcerations suggestive of a friction- induced injury. These are covered with slough, underneath which bone is exposed. On her left heel, there is a clean circular ulcer consistent with a stage III pressure ulceration. There is some slough accumulation here, as well with callus at the edges of the wound. Integumentary (Hair, Skin) Wound #10 status is Open. Original cause of wound was Pressure Injury. The date acquired was: 10/09/2022. The wound is located on the Left T Second. The oe wound measures 0.9cm length x 1cm width x 0.1cm depth; 0.707cm^2 area and 0.071cm^3 volume. There is bone and Fat Layer (Subcutaneous Tissue) exposed. There is no tunneling or undermining noted. There is a none present amount of drainage noted. The wound margin is distinct with the outline attached to the wound base. There is no granulation within the wound bed. There is a large (67-100%) amount of necrotic tissue within the wound bed including Eschar. The periwound skin appearance had no abnormalities noted for texture. The periwound skin appearance had no abnormalities noted for color. The periwound skin appearance exhibited: Dry/Scaly. Periwound temperature was noted as No Abnormality. Wound #11 status is Open. Original cause of wound was Pressure Injury. The date acquired was: 10/09/2022. The wound is located on the Right T Third. The oe wound measures 0.9cm length x 1cm width x 0.1cm depth; 0.707cm^2 area and 0.071cm^3 volume. There is bone and Fat Layer (Subcutaneous Tissue) exposed. There is no tunneling or undermining noted. There is a medium amount of serosanguineous drainage noted. The wound margin is distinct with  the outline attached to the wound base. There is large (67-100%) red, pink granulation within the wound bed. There is a small (1-33%) amount of necrotic tissue within the wound bed including Eschar and Adherent Slough. The periwound skin appearance had no abnormalities noted for texture. The periwound skin appearance exhibited: Dry/Scaly, Rubor. Periwound temperature was noted as No Abnormality. Wound #9 status is Open. Original cause of wound was Pressure Injury. The date acquired was: 10/22/2022. The wound is located on the Left Calcaneus. The wound measures 0.6cm length x 0.7cm width x 0.3cm depth; 0.33cm^2 area and 0.099cm^3 volume. There is Fat Layer (Subcutaneous Tissue) exposed. There is no tunneling noted, however, there is undermining starting at 12:00 and ending at 12:00 with a maximum distance of 0.2cm. There is a medium amount of serosanguineous drainage noted. The wound margin is distinct with the outline  attached to the wound base. There is small (1-33%) red granulation within the wound bed. There is a large (67-100%) amount of necrotic tissue within the wound bed including Adherent Slough. The periwound skin appearance had no abnormalities noted for color. The periwound skin appearance exhibited: Callus, Dry/Scaly. Periwound temperature was noted as No Abnormality. 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 There was a Excisional Skin/Subcutaneous Tissue Debridement oe with a total area of 0.71 sq cm performed by Duanne Guess, MD. With the following instrument(s): Curette to remove Non-Viable tissue/material. Material removed includes Subcutaneous Tissue and Slough and after achieving pain control using Lidocaine 4% T opical Solution. No specimens were taken. A time out was conducted at 13:26, prior to the start of the procedure. A Minimum amount of bleeding was controlled with Pressure. The procedure was tolerated well. Post Debridement Measurements: 0.9cm length x 1cm width x 0.1cm depth; 0.071cm^3 volume. Post debridement Stage noted as Category/Stage IV. Character of Wound/Ulcer Post Debridement is improved. Post procedure Diagnosis Wound #10: Same as Pre-Procedure General Notes: scribed for Dr. Lady Gary by Samuella Bruin, RN. Wound #11 Pre-procedure diagnosis of Wound #11 is a Pressure Ulcer located on the Right T Third . There was a Selective/Open Wound Non-Viable Tissue Debridement oe with a total area of 0.71 sq cm performed by Duanne Guess, MD. With the following instrument(s): Curette to remove Non-Viable tissue/material. Material removed includes Eschar and Slough and after achieving pain control using Lidocaine 4% T opical Solution. No specimens were taken. A time out was conducted at 13:26, prior to the start of the procedure. A Minimum amount of bleeding was controlled with Pressure. The procedure was tolerated well. Post Debridement Measurements: 0.9cm length x 1cm width x 0.1cm depth; 0.071cm^3 volume. Post debridement Stage noted as Category/Stage IV. Character of Wound/Ulcer Post Debridement is improved. Post procedure Diagnosis Wound #11: Same as Pre-Procedure General Notes: scribed for Dr. Lady Gary by Samuella Bruin, RN. Wound #9 Pre-procedure diagnosis of Wound #9 is a Pressure Ulcer located on the Left Calcaneus . There was a Excisional Skin/Subcutaneous  Tissue Debridement with a total area of 0.33 sq cm performed by Duanne Guess, MD. With the following instrument(s): Curette to remove Non-Viable tissue/material. Material removed includes Subcutaneous Tissue and Slough and after achieving pain control using Lidocaine 4% T opical Solution. No specimens were taken. A time out was conducted at 13:26, prior to the start of the procedure. A Minimum amount of bleeding was controlled with Pressure. The procedure was tolerated well. Post Debridement Measurements: 0.6cm length x 0.7cm width x 0.3cm depth; 0.099cm^3 volume. Post debridement Stage noted as Category/Stage II. Character of Wound/Ulcer Post Debridement is improved. Post procedure Diagnosis Wound #9: Same as Pre-Procedure General Notes: scribed for Dr. Lady Gary by Samuella Bruin, RN. Plan Follow-up Appointments: Return Appointment in 1 week. - Dr. Lady Gary - room 2 Anesthetic: (In clinic) Topical Lidocaine 4% applied to wound bed Bathing/ Shower/ Hygiene: May shower and wash wound with soap and water. Edema Control - Lymphedema / SCD / Other: Elevate  legs to the level of the heart or above for 30 minutes daily and/or when sitting for 3-4 times a day throughout the day. Avoid standing for long periods of time. Patient to wear own compression stockings every day. Additional Orders / Instructions: Follow Nutritious Diet - add in protein shakes every day to diet - recommend premier protein 500 mg x3 a day vitamin C, zinc 30-50 mg per day Home Health: New wound care orders this week; continue Home Health for wound care. May utilize formulary equivalent dressing for wound treatment orders unless otherwise specified. Dressing changes to be completed by Home Health on Monday / Wednesday / Friday except when patient has scheduled visit at [facility]. Other Home Health Orders/Instructions: - Medi Home The following medication(s) was prescribed: lidocaine topical 4 % cream cream topical  was prescribed at facility WOUND #10: - T Second Wound Laterality: Left oe Cleanser: Soap and Water 1 x Per Day/30 Days Discharge Instructions: May shower and wash wound with dial antibacterial soap and water prior to dressing change. Cleanser: Wound Cleanser 1 x Per Day/30 Days Discharge Instructions: Cleanse the wound with wound cleanser prior to applying a clean dressing using gauze sponges, not tissue or cotton balls. Prim Dressing: Promogran Prisma Matrix, 4.34 (sq in) (silver collagen) (DME) (Generic) 1 x Per Day/30 Days ary Discharge Instructions: Moisten collagen with saline or hydrogel Secondary Dressing: Woven Gauze Sponge, Non-Sterile 4x4 in (DME) (Generic) 1 x Per Day/30 Days Discharge Instructions: Apply over primary dressing as directed. Secured With: 1M Medipore H Soft Cloth Surgical T ape, 4 x 10 (in/yd) (DME) (Generic) 1 x Per Day/30 Days Discharge Instructions: Secure with tape as directed. [name], [name] ([id number]) [id number] Page 11 of 14 WOUND #11: - T Third oe Wound Laterality: Right Cleanser: Soap and Water 1 x Per Day/30 Days Discharge Instructions: May shower and wash wound with dial antibacterial soap and water prior to dressing change. Cleanser: Wound Cleanser 1 x Per Day/30 Days Discharge Instructions: Cleanse the wound with wound cleanser prior to applying a clean dressing using gauze sponges, not tissue or cotton balls. Prim Dressing: Promogran Prisma Matrix, 4.34 (sq in) (silver collagen) (DME) (Generic) 1 x Per Day/30 Days ary Discharge Instructions: Moisten collagen with saline or hydrogel Secondary Dressing: Woven Gauze Sponge, Non-Sterile 4x4 in (DME) (Generic) 1 x Per Day/30 Days Discharge Instructions: Apply over primary dressing as directed. Secured With: 1M Medipore H Soft Cloth Surgical T ape, 4 x 10 (in/yd) (DME) (Generic) 1 x Per Day/30 Days Discharge Instructions: Secure with tape as directed. WOUND #9: - Calcaneus  Wound Laterality: Left Cleanser: Soap and Water 1 x Per Day/30 Days Discharge Instructions: May shower and wash wound with dial antibacterial soap and water prior to dressing change. Cleanser: Wound Cleanser 1 x Per Day/30 Days Discharge Instructions: Cleanse the wound with wound cleanser prior to applying a clean dressing using gauze sponges, not tissue or cotton balls. Prim Dressing: Promogran Prisma Matrix, 4.34 (sq in) (silver collagen) (DME) (Generic) 1 x Per Day/30 Days ary Discharge Instructions: Moisten collagen with saline or hydrogel Secondary Dressing: ALLEVYN Heel 4 1/2in x 5 1/2in / 10.5cm x 13.5cm (DME) (Generic) 1 x Per Day/30 Days Discharge Instructions: Apply over primary dressing as directed. Secondary Dressing: Woven Gauze Sponge, Non-Sterile 4x4 in (DME) (Generic) 1 x Per Day/30 Days Discharge Instructions: Apply over primary dressing as directed. Secured With: 1M Medipore H Soft Cloth Surgical T ape, 4 x 10 (in/yd) (DME) (Generic) 1 x Per Day/30 Days  Discharge Instructions: Secure with tape as directed. 03/10/2023: On the dorsal surface of the PIP joint on her left second and right third toes, there are circular ulcerations suggestive of a friction-induced injury. These are covered with slough, underneath which bone is exposed. On her left heel, there is a clean circular ulcer consistent with a stage III pressure ulceration. There is some slough accumulation here, as well with callus at the edges of the wound. I used a curette to debride slough, eschar, and callus from her wounds. Subcutaneous tissue was also debrided from the heel and left toe. We will apply Prisma silver collagen to all sites. I discussed the importance of protein intake with a goal of around 100 g of protein per day. The patient is not getting anywhere near this and I recommended the use of a protein shake supplement to try and achieve something close to this goal. I also have asked her to start taking  vitamin C 500 mg 3 times a day and zinc 30 to 50 mg/day. She does have Prevalon boots but they have not been using them since her discharge from the hospital. Today resume doing so for better heel offloading. Follow-up. Electronic Signature(s) Signed: 03/11/2023 4:16:02 PM By: Samuella Bruin Signed: 03/11/2023 4:41:42 PM By: Duanne Guess MD FACS Previous Signature: 03/10/2023 3:05:53 PM Version By: Duanne Guess MD FACS Previous Signature: 03/10/2023 3:04:33 PM Version By: Duanne Guess MD FACS Entered By: Samuella Bruin on 03/11/2023 13:40:03 -------------------------------------------------------------------------------- HxROS Details Patient Name: Date of Service: [name] 03/10/2023 12:45 PM Medical Record Number: [id number] Patient Account Number: [id number] Date of Birth/Sex: Treating RN: March 07, 1928 (89 y.o. [name] Primary Care Provider: [name] Other Clinician: Referring Provider: Treating Provider/Extender: Claudie Leach Weeks in Treatment: 0 Information Obtained From Patient Caregiver Chart Constitutional Symptoms (General Health) Complaints and Symptoms: Negative for: Fatigue; Fever; Chills; Marked Weight Change Ear/Nose/Mouth/Throat Complaints and Symptoms: Negative for: Chronic sinus problems or rhinitis Integumentary (Skin) Complaints and Symptoms: Positive for: Wounds [name], [name] ([id number]) [id number] Page 12 of 14 Psychiatric Complaints and Symptoms: Negative for: Claustrophobia Eyes Medical History: Positive for: Cataracts Hematologic/Lymphatic Medical History: Positive for: Anemia Past Medical History Notes: Thrombocytopenia, Anticoagulant long-term use Respiratory Medical History: Negative for: Chronic Obstructive Pulmonary Disease (COPD) Past Medical History Notes: Pulmonary embolus Cardiovascular Medical History: Positive for: Arrhythmia - a-fib; Congestive Heart  Failure; Hypotension; Peripheral Venous Disease Gastrointestinal Medical History: Past Medical History Notes: Hiatal hernia Endocrine Medical History: Past Medical History Notes: Hyperthyroidism, Hypothyroidism Genitourinary Medical History: Past Medical History Notes: CKD stage III Immunological Musculoskeletal Medical History: Positive for: 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 Past Medical History Notes: arthritis Neurologic Medical History: Positive for: Neuropathy Oncologic HBO Extended History Items Eyes: Cataracts Immunizations Pneumococcal Vaccine: Received Pneumococcal Vaccination: No Implantable Devices None Hospitalization / Surgery History Type of Hospitalization/Surgery Devore, [name] ([id number]) [id number] Page 13 of 14 Abdominal aortogram w/lower extremity Amputation toe (Right) Colonoscopy Elbow surgery Laparoscopic hysterectomy Family and Social History Cancer: Yes - Siblings; Diabetes: No; Heart Disease: Yes; Stroke: Yes - Mother; Former smoker - quit 2003; Marital Status - Widowed; Alcohol Use: Never; Drug Use: No History; Caffeine Use: Never; Financial Concerns: No; Food, Clothing or Shelter Needs: No; Support System Lacking: No; Transportation Concerns: No Electronic Signature(s) Signed: 03/10/2023 1:00:40 PM By: Duanne Guess MD FACS Signed: 03/10/2023 3:06:27 PM By: Samuella Bruin Entered By: Samuella Bruin on 03/10/2023 12:52:17 -------------------------------------------------------------------------------- SuperBill Details Patient Name: Date of Service: Megan Chick TTIE [name]. 03/10/2023 Medical  Record Number: [id number] Patient Account Number: [id number] Date of Birth/Sex: Treating RN: October 21, 1927 (89 y.o. 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 I73.9 Peripheral vascular disease, unspecified I50.32 Chronic diastolic (congestive) heart failure N18.32 Chronic kidney disease, stage 3b I87.2 Venous insufficiency (chronic) (peripheral) Facility Procedures : CPT4 Code: 96295284 Description: 99213 - WOUND CARE VISIT-LEV 3 EST PT Modifier: Quantity: 1 : CPT4 Code: 13244010 Description: 11042 - DEB SUBQ TISSUE 20 SQ CM/< ICD-10 Diagnosis Description L89.623 Pressure ulcer of left heel, stage 3 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 witho Modifier: ut evidence of necr Quantity: 1 osis : CPT4 Code: [id number] Description: 97597 - DEBRIDE WOUND 1ST 20 SQ CM OR < ICD-10 Diagnosis Description 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 with Modifier: out evidence of nec Quantity: 1 rosis Physician Procedures : CPT4 Code Description Modifier [id number] 99214 - WC PHYS LEVEL 4 - EST PT 25 ICD-10 Diagnosis Description L89.623 Pressure ulcer of left heel, 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 without evidence of necro I73.9 Peripheral vascular disease, unspecified Quantity: 1 osis sis : [id number] 11042 - WC PHYS SUBQ TISS 20 SQ CM ICD-10 Diagnosis Description Ponder, Lincoln [name] ([id number]) [id number] [phone] Pressure ulcer of left heel, stage 3 L97.526 Non-pressure chronic ulcer of other part of left foot with  bone involvement without evidence of necrosis Quantity: 1 Page 14 of 14 : [id number] 97597 - WC  PHYS DEBR WO ANESTH 20 SQ CM ICD-10 Diagnosis Description 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

## 2023-03-10 NOTE — Progress Notes (Signed)
MISS, DUGAL ([id number]) [phone] [id number] Page 1 of 4 Visit Report for 03/10/2023 Abuse Risk Screen Details Patient Name: Date of Service: [name], [name] 03/10/2023 12:45 PM Medical Record Number: [id number] Patient Account Number: [id number] Date of Birth/Sex: Treating RN: 07-22-1928 (89 y.o. [name] Primary Care Frances Ambrosino: [name] Other Clinician: Referring Chancy Smigiel: Treating Braedin Millhouse/Extender: Claudie Leach Weeks in Treatment: 0 Abuse Risk Screen Items Answer ABUSE RISK SCREEN: Has anyone close to you tried to hurt or harm you recentlyo No Do you feel uncomfortable with anyone in your familyo No Has anyone forced you do things that you didnt want to doo No Electronic Signature(s) Signed: 03/10/2023 3:06:27 PM By: Samuella Bruin Entered By: Samuella Bruin on 03/10/2023 12:41:05 -------------------------------------------------------------------------------- Activities of Daily Living Details Patient Name: Date of Service: [name], [name] 03/10/2023 12:45 PM Medical Record Number: [id number] Patient Account Number: [id number] Date of Birth/Sex: Treating RN: 25-Aug-1928 (89 y.o. [name] Primary Care Yesenia Fontenette: [name] Other Clinician: Referring Cher Franzoni: Treating Shawnya Mayor/Extender: Claudie Leach Weeks in Treatment: 0 Activities of Daily Living Items Answer Activities of Daily Living (Please select one for each item) Drive Automobile Not Able T Medications ake Need Assistance Use T elephone Completely Able Care for Appearance Need Assistance Use T oilet Need Assistance Bath / Shower Need Assistance Dress Self Need Assistance Feed Self Need Assistance Walk Need Assistance Get In / Out Bed Need Assistance Housework Need Assistance Prepare Meals Need Assistance Handle Money Need Assistance Shop for Self Need Assistance Electronic Signature(s) Signed: 03/10/2023 3:06:27 PM  By: Samuella Bruin Entered By: Samuella Bruin on 03/10/2023 12:53:03 Donica, [name] ([id number]) [id number] Page 2 of 4 -------------------------------------------------------------------------------- Education Screening Details Patient Name: Date of Service: [name], [name] 03/10/2023 12:45 PM Medical Record Number: [id number] Patient Account Number: [id number] Date of Birth/Sex: Treating RN: [birth year] (89 y.o. [name] Primary Care Zeva Leber: [name] Other Clinician: Referring Stefanie Hodgens: Treating Mirian Casco/Extender: Tiajuana Amass in Treatment: 0 Primary Learner Assessed: Patient Learning Preferences/Education Level/Primary Language Learning Preference: Explanation, Demonstration, Video, Printed Material Highest Education Level: High School Preferred Language: English Cognitive Barrier Language Barrier: No Translator Needed: No Memory Deficit: No Emotional Barrier: No Cultural/Religious Beliefs Affecting Medical Care: No Physical Barrier Impaired Vision: Yes Glasses Impaired Hearing: No Decreased Hand dexterity: No Knowledge/Comprehension Knowledge Level: Medium Comprehension Level: Medium Ability to understand written instructions: Medium Ability to understand verbal instructions: Medium Motivation Anxiety Level: Calm Cooperation: Cooperative Education Importance: Acknowledges Need Interest in Health Problems: Asks Questions Perception: Coherent Willingness to Engage in Self-Management Medium Activities: Readiness to Engage in Self-Management Medium Activities: Electronic Signature(s) Signed: 03/10/2023 3:06:27 PM By: Samuella Bruin Entered By: Samuella Bruin on 03/10/2023 12:53:50 -------------------------------------------------------------------------------- Fall Risk Assessment Details Patient Name: Date of Service: [name]. 03/10/2023 12:45 PM Medical Record Number:  [id number] Patient Account Number: [id number] Date of Birth/Sex: Treating RN: Jan 31, 1928 (89 y.o. [name] Primary Care Ebelyn Bohnet: [name] Other Clinician: Referring Brannan Cassedy: Treating Jayceon Troy/Extender: Claudie Leach Weeks in Treatment: 0 Fall Risk Assessment Items Have you had 2 or more falls in the last 12 monthso 0 Yes [place], Jeanene M ([id number]) [id number] Page 3 of 4 Have you had any fall that resulted in injury in the last 12 monthso 0 No FALLS RISK SCREEN History of falling - immediate or within 3 months 25 Yes Secondary diagnosis (Do you have 2 or more medical diagnoseso) 15 Yes Ambulatory aid None/bed rest/wheelchair/nurse 0 Yes Crutches/cane/walker  15 Yes Furniture 0 No Intravenous therapy Access/Saline/Heparin Lock 0 No Gait/Transferring Normal/ bed rest/ wheelchair 0 Yes Weak (short steps with or without shuffle, stooped but able to lift head while walking, may seek 0 No support from furniture) Impaired (short steps with shuffle, may have difficulty arising from chair, head down, impaired 0 No balance) Mental Status Oriented to own ability 0 Yes Electronic Signature(s) Signed: 03/10/2023 3:06:27 PM By: Samuella Bruin Entered By: Samuella Bruin on 03/10/2023 12:54:45 -------------------------------------------------------------------------------- Foot Assessment Details Patient Name: Date of Service: [name]. 03/10/2023 12:45 PM Medical Record Number: [id number] Patient Account Number: [id number] Date of Birth/Sex: Treating RN: 27-Dec-1927 (89 y.o. [name] Primary Care Bronislaw Switzer: [name] Other Clinician: Referring Rudell Marlowe: Treating Rayjon Wery/Extender: Claudie Leach Weeks in Treatment: 0 Foot Assessment Items Site Locations + = Sensation present, - = Sensation absent, C = Callus, U = Ulcer R = Redness, W = Warmth, M = Maceration, PU = Pre-ulcerative  lesion F = Fissure, S = Swelling, D = Dryness Assessment Right: Left: Other Deformity: No No Prior Foot Ulcer: No No Prior Amputation: No No Charcot Joint: No No Ambulatory Status: Ambulatory With Help Assistance Device: SHALYNN, MANALAC ([id number]) [phone] [id number] Page 4 of 4 Gait: Electronic Signature(s) Signed: 03/10/2023 3:06:27 PM By: Samuella Bruin Entered By: Samuella Bruin on 03/10/2023 13:01:15 -------------------------------------------------------------------------------- Nutrition Risk Screening Details Patient Name: Date of Service: [name], [name] 03/10/2023 12:45 PM Medical Record Number: [id number] Patient Account Number: [id number] Date of Birth/Sex: Treating RN: 04/26/1928 (89 y.o. [name] Primary Care Yaritzel Stange: [name] Other Clinician: Referring Alleen Kehm: Treating Minnette Merida/Extender: Claudie Leach Weeks in Treatment: 0 Height (in): Weight (lbs): Body Mass Index (BMI): Nutrition Risk Screening Items Score Screening NUTRITION RISK SCREEN: I have an illness or condition that made me change the kind and/or amount of food I eat 0 No I eat fewer than two meals per day 0 No I eat few fruits and vegetables, or milk products 0 No I have three or more drinks of beer, liquor or wine almost every day 0 No I have tooth or mouth problems that make it hard for me to eat 0 No I don't always have enough money to buy the food I need 0 No I eat alone most of the time 0 No I take three or more different prescribed or over-the-counter drugs a day 1 Yes Without wanting to, I have lost or gained 10 pounds in the last six months 0 No I am not always physically able to shop, cook and/or feed myself 2 Yes Nutrition Protocols Good Risk Protocol Moderate Risk Protocol 0 Provide education on nutrition High Risk Proctocol Risk Level: Moderate Risk Score: 3 Electronic Signature(s) Signed: 03/10/2023 3:06:27  PM By: Samuella Bruin Entered By: Samuella Bruin on 03/10/2023 12:55:27

## 2023-03-17 ENCOUNTER — Other Ambulatory Visit: Payer: Self-pay | Admitting: *Deleted

## 2023-03-17 DIAGNOSIS — I7025 Atherosclerosis of native arteries of other extremities with ulceration: Secondary | ICD-10-CM

## 2023-03-18 ENCOUNTER — Encounter (HOSPITAL_BASED_OUTPATIENT_CLINIC_OR_DEPARTMENT_OTHER): Payer: Medicare HMO | Admitting: General Surgery

## 2023-03-18 DIAGNOSIS — L89623 Pressure ulcer of left heel, stage 3: Secondary | ICD-10-CM | POA: Diagnosis not present

## 2023-03-19 NOTE — Progress Notes (Signed)
[name], [name] ([id number]) [id number] Page 1 of 12 Visit Report for 03/18/2023 Arrival Information Details Patient Name: Date of Service: [name], [name] 03/18/2023 3:30 PM Medical Record Number: [id number] Patient Account Number: [id number] Date of Birth/Sex: Treating RN: 09/14/1927 (89 y.o. F) Primary Care [name]: [name] Other Clinician: Referring [name]: Treating [name]/Extender: [name] in Treatment: 1 Visit Information History Since Last Visit All ordered tests and consults were completed: No Patient Arrived: Wheel Chair Added or deleted any medications: No Arrival Time: 15:47 Any new allergies or adverse reactions: No Accompanied By: daughter Had a fall or experienced change in No Transfer Assistance: None activities of daily living that may affect Patient Identification Verified: Yes risk of falls: Secondary Verification Process Completed: Yes Signs or symptoms of abuse/neglect since last visito No Patient Has Alerts: Yes Hospitalized since last visit: No Patient Alerts: ABI R: 0.48 L: 0.46 2/24 Implantable device outside of the clinic excluding No TBI R: 0.39 L: 0.21 2/24 cellular tissue based products placed in the center since last visit: Has Dressing in Place as Prescribed: Yes Pain Present Now: No Electronic Signature(s) Signed: 03/18/2023 5:46:42 PM By: Megan Deed RN, BSN Entered By: [name] on 03/18/2023 15:59:42 -------------------------------------------------------------------------------- Encounter Discharge Information Details Patient Name: Date of Service: Megan Reap. 03/18/2023 3:30 PM Medical Record Number: [id number] Patient Account Number: [id number] Date of Birth/Sex: Treating RN: 1928/03/28 (89 y.o. [name] Primary Care [name]: [name] Other Clinician: Referring Erial Fikes: Treating Sania Noy/Extender: [name] Weeks in  Treatment: 1 Encounter Discharge Information Items Post Procedure Vitals Discharge Condition: Stable Temperature (F): 98.4 Ambulatory Status: Wheelchair Pulse (bpm): 76 Discharge Destination: Home Respiratory Rate (breaths/min): 18 Transportation: Private Auto Blood Pressure (mmHg): 169/69 Accompanied By: daughter Schedule Follow-up Appointment: Yes Clinical Summary of Care: Patient Declined Electronic Signature(s) Signed: 03/18/2023 5:46:42 PM By: Megan Deed RN, BSN Entered By: [name] on 03/18/2023 17:07:05 [name] ([id number]) [id number] Page 2 of 12 -------------------------------------------------------------------------------- Lower Extremity Assessment Details Patient Name: Date of Service: Megan, [name] 03/18/2023 3:30 PM Medical Record Number: [id number] Patient Account Number: [id number] Date of Birth/Sex: Treating RN: 1928-07-26 (89 y.o. [name] Primary Care [name]: [name] Other Clinician: Referring Codie Hainer: Treating Beatryce Colombo/Extender: [name] Weeks in Treatment: 1 Edema Assessment Assessed: [Left: No] [Right: No] Edema: [Left: Yes] [Right: Yes] Calf Left: Right: Point of Measurement: From Medial Instep 36.4 cm 38.5 cm Ankle Left: Right: Point of Measurement: From Medial Instep 21.8 cm 22 cm Vascular Assessment Pulses: Dorsalis Pedis Palpable: [Left:No] [Right:No] Extremity colors, hair growth, and conditions: Extremity Color: [Left:Hyperpigmented] [Right:Hyperpigmented] Hair Growth on Extremity: [Left:No] [Right:No] Temperature of Extremity: [Left:Warm < 3 seconds] [Right:Warm < 3 seconds] Electronic Signature(s) Signed: 03/18/2023 5:46:42 PM By: Megan Deed RN, BSN Entered By: [name] on 03/18/2023 16:01:04 -------------------------------------------------------------------------------- Multi Wound Chart Details Patient Name: Date of Service: Megan Reap. 03/18/2023 3:30 PM Medical Record Number: [id number] Patient Account Number: [id number] Date of Birth/Sex: Treating RN: 1927-12-04 (89 y.o. F) Primary Care Kaeya Schiffer: [name] Other Clinician: Referring Malaquias Lenker: Treating Qamar Rosman/Extender: [name] Weeks in Treatment: 1 Vital Signs Height(in): 64 Pulse(bpm): 76 Weight(lbs): 157 Blood Pressure(mmHg): 169/69 Body Mass Index(BMI): 26.9 Temperature(F): 98.4 Respiratory Rate(breaths/min): 18 [10:Photos:] [[id number] Page 3 of 12] Left T Second oe Right T Third oe Left T Great oe Wound Location: Pressure Injury Pressure Injury Not Known Wounding Event: Pressure Ulcer Pressure Ulcer T be determined o Primary Etiology: Cataracts, Anemia,  Arrhythmia, Cataracts, Anemia, Arrhythmia, Cataracts, Anemia, Arrhythmia, Comorbid History: Congestive Heart Failure, Congestive Heart Failure, Congestive Heart Failure, Hypotension, Peripheral Venous Hypotension, Peripheral Venous Hypotension, Peripheral Venous Disease, Osteomyelitis, Neuropathy Disease, Osteomyelitis, Neuropathy Disease, Osteomyelitis, Neuropathy 10/09/2022 10/09/2022 03/17/2023 Date Acquired: 1 1 0 Weeks of Treatment: Open Open Open Wound Status: No No No Wound Recurrence: 0.9x1x0.1 0.9x0.9x0.1 0.7x0.9x0.1 Measurements L x W x D (cm) 0.707 0.636 0.495 A (cm) : rea 0.071 0.064 0.049 Volume (cm) : 0.00% 10.00% N/A % Reduction in A rea: 0.00% 9.90% N/A % Reduction in Volume: Category/Stage IV Category/Stage IV Full Thickness Without Exposed Classification: Support Structures Small Medium Medium Exudate A mount: Serosanguineous Serosanguineous Serous Exudate Type: red, brown red, brown amber Exudate Color: Distinct, outline attached Distinct, outline attached Flat and Intact Wound Margin: Large (67-100%) Medium (34-66%) Small (1-33%) Granulation A mount: Red Pink Pink Granulation Quality: Small (1-33%)  Medium (34-66%) Large (67-100%) Necrotic A mount: Fat Layer (Subcutaneous Tissue): Yes Fat Layer (Subcutaneous Tissue): Yes Fat Layer (Subcutaneous Tissue): Yes Exposed Structures: Joint: Yes Joint: Yes Fascia: No Bone: Yes Bone: Yes Tendon: No Fascia: No Fascia: No Muscle: No Tendon: No Tendon: No Joint: No Muscle: No Muscle: No Bone: No Small (1-33%) None Small (1-33%) Epithelialization: Debridement - Selective/Open Wound Debridement - Selective/Open Wound Debridement - Selective/Open Wound Debridement: Pre-procedure Verification/Time Out 16:10 16:10 16:10 Taken: Lidocaine 4% Topical Solution Lidocaine 4% Topical Solution Lidocaine 4% Topical Solution Pain Control: Principal Financial Tissue Debrided: Non-Viable Tissue Non-Viable Tissue Non-Viable Tissue Level: 0.71 0.64 0.49 Debridement A (sq cm): rea Curette Curette Curette Instrument: Minimum Minimum Minimum Bleeding: Pressure Pressure Pressure Hemostasis A chieved: 0 0 0 Procedural Pain: 0 0 0 Post Procedural Pain: Procedure was tolerated well Procedure was tolerated well Procedure was tolerated well Debridement Treatment Response: 0.9x1x0.1 0.9x0.9x0.1 0.7x0.9x0.1 Post Debridement Measurements L x W x D (cm) 0.071 0.064 0.049 Post Debridement Volume: (cm) Category/Stage IV Category/Stage IV N/A Post Debridement Stage: No Abnormalities Noted No Abnormalities Noted No Abnormalities Noted Periwound Skin Texture: Dry/Scaly: Yes Dry/Scaly: Yes No Abnormalities Noted Periwound Skin Moisture: No Abnormalities Noted Rubor: No Erythema: Yes Periwound Skin Color: N/A N/A Circumferential Erythema Location: No Abnormality No Abnormality No Abnormality Temperature: N/A N/A Yes Tenderness on Palpation: Debridement Debridement Debridement Procedures Performed: Wound Number: 9 N/A N/A Photos: N/A N/A Left Calcaneus N/A N/A Wound Location: Pressure Injury N/A N/A Wounding Event: Pressure Ulcer N/A  N/A Primary Etiology: Cataracts, Anemia, Arrhythmia, N/A N/A Comorbid History: Congestive Heart Failure, Hypotension, Peripheral Venous Disease, Osteomyelitis, Neuropathy 10/22/2022 N/A N/A Date Acquired: 1 N/A N/A Weeks of Treatment: Open N/A N/A Wound Status: No N/A N/A Wound Recurrence: [name], [name] ([id number]) [id number] Page 4 of 12 0.6x0.9x0.4 N/A N/A Measurements L x W x D (cm) 0.424 N/A N/A A (cm) : rea 0.17 N/A N/A Volume (cm) : -28.50% N/A N/A % Reduction in A rea: -71.70% N/A N/A % Reduction in Volume: Category/Stage II N/A N/A Classification: Medium N/A N/A Exudate A mount: Serosanguineous N/A N/A Exudate Type: red, brown N/A N/A Exudate Color: Distinct, outline attached N/A N/A Wound Margin: Small (1-33%) N/A N/A Granulation A mount: Red N/A N/A Granulation Quality: Large (67-100%) N/A N/A Necrotic A mount: Fat Layer (Subcutaneous Tissue): Yes N/A N/A Exposed Structures: Fascia: No Tendon: No Muscle: No Joint: No Bone: No None N/A N/A Epithelialization: Debridement - Excisional N/A N/A Debridement: Pre-procedure Verification/Time Out 16:10 N/A N/A Taken: Lidocaine 4% Topical Solution N/A N/A Pain Control: Subcutaneous, Slough N/A N/A Tissue Debrided: Skin/Subcutaneous Tissue N/A  N/A Level: 0.42 N/A N/A Debridement A (sq cm): rea Curette N/A N/A Instrument: Minimum N/A N/A Bleeding: Pressure N/A N/A Hemostasis A chieved: 0 N/A N/A Procedural Pain: 0 N/A N/A Post Procedural Pain: Procedure was tolerated well N/A N/A Debridement Treatment Response: 0.6x0.9x0.4 N/A N/A Post Debridement Measurements L x W x D (cm) 0.17 N/A N/A Post Debridement Volume: (cm) Category/Stage II N/A N/A Post Debridement Stage: Callus: No N/A N/A Periwound Skin Texture: Dry/Scaly: Yes N/A N/A Periwound Skin Moisture: No Abnormalities Noted N/A N/A Periwound Skin Color: N/A N/A N/A Erythema Location: No  Abnormality N/A N/A Temperature: Yes N/A N/A Tenderness on Palpation: Debridement N/A N/A Procedures Performed: Treatment Notes Electronic Signature(s) Signed: 03/18/2023 4:25:34 PM By: Duanne Guess MD FACS Entered By: Duanne Guess on 03/18/2023 16:25:34 -------------------------------------------------------------------------------- Multi-Disciplinary Care Plan Details Patient Name: Date of Service: Megan Reap. 03/18/2023 3:30 PM Medical Record Number: [id number] Patient Account Number: [id number] Date of Birth/Sex: Treating RN: [birth year] (89 y.o. [name] Primary Care Yazmina Pareja: [name] Other Clinician: Referring Jeorge Reister: Treating Reginald Mangels/Extender: [name] in Treatment: 1 Multidisciplinary Care Plan reviewed with physician Active Inactive Abuse / Safety / Falls / Self Care Management Nursing Diagnoses: Impaired home maintenance Impaired physical mobility [name], [name] ([id number]) [id number] Page 5 of 12 Potential for falls Goals: Patient/caregiver will verbalize/demonstrate measure taken to improve self care Date Initiated: 03/10/2023 Target Resolution Date: 05/15/2023 Goal Status: Active Patient/caregiver will verbalize/demonstrate measures taken to improve the patient's personal safety Date Initiated: 03/10/2023 Target Resolution Date: 05/15/2023 Goal Status: Active Interventions: Assess fall risk on admission and as needed Assess: immobility, friction, shearing, incontinence upon admission and as needed Provide education on fall prevention Notes: Wound/Skin Impairment Nursing Diagnoses: Impaired tissue integrity Knowledge deficit related to ulceration/compromised skin integrity Goals: Patient/caregiver will verbalize understanding of skin care regimen Date Initiated: 03/10/2023 Target Resolution Date: 05/15/2023 Goal Status: Active Interventions: Assess patient/caregiver ability to obtain  necessary supplies Assess patient/caregiver ability to perform ulcer/skin care regimen upon admission and as needed Assess ulceration(s) every visit Treatment Activities: Skin care regimen initiated : 03/10/2023 Topical wound management initiated : 03/10/2023 Notes: Electronic Signature(s) Signed: 03/18/2023 5:46:42 PM By: Megan Deed RN, BSN Entered By: [name] on 03/18/2023 16:10:11 -------------------------------------------------------------------------------- Pain Assessment Details Patient Name: Date of Service: Megan Reap. 03/18/2023 3:30 PM Medical Record Number: [id number] Patient Account Number: [id number] Date of Birth/Sex: Treating RN: [birth year] (89 y.o. F) Primary Care Abbee Cremeens: [name] Other Clinician: Referring Natally Ribera: Treating Adaiah Jaskot/Extender: [name] Weeks in Treatment: 1 Active Problems Location of Pain Severity and Description of Pain Patient Has Paino No Site Locations Rate the pain. [name], [name] ([id number]) [id number] Page 6 of 12 Rate the pain. Current Pain Level: 0 Character of Pain Describe the Pain: Tender Pain Management and Medication Current Pain Management: Electronic Signature(s) Signed: 03/18/2023 5:46:42 PM By: Megan Deed RN, BSN Entered By: [name] on 03/18/2023 16:00:04 -------------------------------------------------------------------------------- Patient/Caregiver Education Details Patient Name: Date of Service: Megan Reap 7/10/2024andnbsp3:30 PM Medical Record Number: [id number] Patient Account Number: [id number] Date of Birth/Gender: Treating RN: [birth year] (89 y.o. [name] Primary Care Physician: [name] Other Clinician: Referring Physician: Treating Physician/Extender: [name] in Treatment: 1 Education Assessment Education Provided To: Patient Education Topics Provided Tissue  Oxygenation: Methods: Explain/Verbal Responses: Reinforcements needed, State content correctly Wound/Skin Impairment: Methods: Explain/Verbal Responses: Reinforcements needed, State content correctly Electronic Signature(s) Signed: 03/18/2023 5:46:42 PM By: Megan Deed RN, BSN Entered By: Daneil Dan,  [name] on 03/18/2023 16:10:33 [name], [name] ([id number]) [id number] Page 7 of 12 -------------------------------------------------------------------------------- Wound Assessment Details Patient Name: Date of Service: DELPHA, PERKO 03/18/2023 3:30 PM Medical Record Number: [id number] Patient Account Number: [id number] Date of Birth/Sex: Treating RN: 1928/07/12 (89 y.o. F) Primary Care Mariyah Upshaw: [name] Other Clinician: Referring Marguerite Barba: Treating Marijke Guadiana/Extender: [name] Weeks in Treatment: 1 Wound Status Wound Number: 10 Primary Pressure Ulcer Etiology: Wound Location: Left T Second oe Wound Open Wounding Event: Pressure Injury Status: Date Acquired: 10/09/2022 Comorbid Cataracts, Anemia, Arrhythmia, Congestive Heart Failure, Weeks Of Treatment: 1 History: Hypotension, Peripheral Venous Disease, Osteomyelitis, Clustered Wound: No Neuropathy Photos Wound Measurements Length: (cm) 0.9 Width: (cm) 1 Depth: (cm) 0.1 Area: (cm) 0.707 Volume: (cm) 0.071 % Reduction in Area: 0% % Reduction in Volume: 0% Epithelialization: Small (1-33%) Tunneling: No Undermining: No Wound Description Classification: Category/Stage IV Wound Margin: Distinct, outline attached Exudate Amount: Small Exudate Type: Serosanguineous Exudate Color: red, brown Foul Odor After Cleansing: No Slough/Fibrino No Wound Bed Granulation Amount: Large (67-100%) Exposed Structure Granulation Quality: Red Fascia Exposed: No Necrotic Amount: Small (1-33%) Fat Layer (Subcutaneous Tissue) Exposed: Yes Necrotic Quality: Adherent Slough Tendon Exposed:  No Muscle Exposed: No Joint Exposed: Yes Bone Exposed: Yes Periwound Skin Texture Texture Color No Abnormalities Noted: Yes No Abnormalities Noted: Yes Moisture Temperature / Pain No Abnormalities Noted: Yes Temperature: No Abnormality Treatment Notes Wound #10 (Toe Second) Wound Laterality: Left Cleanser Soap and Water Discharge Instruction: May shower and wash wound with dial antibacterial soap and water prior to dressing change. Wound Cleanser Discharge Instruction: Cleanse the wound with wound cleanser prior to applying a clean dressing using gauze sponges, not tissue or cotton balls. Peri-Wound Care AMESHIA, PEWITT ([id number]) [id number] Page 8 of 12 Topical Primary Dressing Promogran Prisma Matrix, 4.34 (sq in) (silver collagen) Discharge Instruction: Moisten collagen with saline or hydrogel Secondary Dressing Woven Gauze Sponge, Non-Sterile 4x4 in Discharge Instruction: Apply over primary dressing as directed. Secured With 46M Medipore H Soft Cloth Surgical T ape, 4 x 10 (in/yd) Discharge Instruction: Secure with tape as directed. Compression Wrap Compression Stockings Add-Ons Electronic Signature(s) Signed: 03/18/2023 5:46:42 PM By: Megan Deed RN, BSN Entered By: [name] on 03/18/2023 16:02:27 -------------------------------------------------------------------------------- Wound Assessment Details Patient Name: Date of Service: Megan Reap. 03/18/2023 3:30 PM Medical Record Number: [id number] Patient Account Number: [id number] Date of Birth/Sex: Treating RN: [birth year] (89 y.o. F) Primary Care Dejanique Ruehl: [name] Other Clinician: Referring Dymond Gutt: Treating Icarus Partch/Extender: [name] Weeks in Treatment: 1 Wound Status Wound Number: 11 Primary Pressure Ulcer Etiology: Wound Location: Right T Third oe Wound Open Wounding Event: Pressure Injury Status: Date Acquired: 10/09/2022 Comorbid  Cataracts, Anemia, Arrhythmia, Congestive Heart Failure, Weeks Of Treatment: 1 History: Hypotension, Peripheral Venous Disease, Osteomyelitis, Clustered Wound: No Neuropathy Photos Wound Measurements Length: (cm) 0.9 Width: (cm) 0.9 Depth: (cm) 0.1 Area: (cm) 0.636 Volume: (cm) 0.064 % Reduction in Area: 10% % Reduction in Volume: 9.9% Epithelialization: None Tunneling: No Undermining: No Wound Description Classification: Category/Stage IV Wound Margin: Distinct, outline attached Exudate Amount: Medium [name], [name] ([id number]) Exudate Type: Serosanguineous Exudate Color: red, brown Foul Odor After Cleansing: No Slough/Fibrino Yes [id number] Page 9 of 12 Wound Bed Granulation Amount: Medium (34-66%) Exposed Structure Granulation Quality: Pink Fascia Exposed: No Necrotic Amount: Medium (34-66%) Fat Layer (Subcutaneous Tissue) Exposed: Yes Necrotic Quality: Adherent Slough Tendon Exposed: No Muscle Exposed: No Joint Exposed: Yes Bone Exposed: Yes Periwound Skin Texture Texture Color No Abnormalities Noted: Yes  No Abnormalities Noted: Yes Moisture Temperature / Pain No Abnormalities Noted: Yes Temperature: No Abnormality Treatment Notes Wound #11 (Toe Third) Wound Laterality: Right Cleanser Soap and Water Discharge Instruction: May shower and wash wound with dial antibacterial soap and water prior to dressing change. Wound Cleanser Discharge Instruction: Cleanse the wound with wound cleanser prior to applying a clean dressing using gauze sponges, not tissue or cotton balls. Peri-Wound Care Topical Primary Dressing Promogran Prisma Matrix, 4.34 (sq in) (silver collagen) Discharge Instruction: Moisten collagen with saline or hydrogel Secondary Dressing Woven Gauze Sponge, Non-Sterile 4x4 in Discharge Instruction: Apply over primary dressing as directed. Secured With 3M Medipore H Soft Cloth Surgical T ape, 4 x 10 (in/yd) Discharge  Instruction: Secure with tape as directed. Compression Wrap Compression Stockings Add-Ons Electronic Signature(s) Signed: 03/18/2023 5:46:42 PM By: Megan Deed RN, BSN Entered By: [name] on 03/18/2023 16:03:15 -------------------------------------------------------------------------------- Wound Assessment Details Patient Name: Date of Service: Megan Reap. 03/18/2023 3:30 PM Medical Record Number: [id number] Patient Account Number: [id number] Date of Birth/Sex: Treating RN: 1928-01-19 (89 y.o. F) Primary Care Gayle Martinez: [name] Other Clinician: Referring Logon Uttech: Treating Yitzhak Awan/Extender: [name] Weeks in Treatment: 1 Wound Status Wound Number: 12 Primary T be determined o Etiology: Wound Location: Left T Great oe Wound Open Wounding Event: Not Known [name], [name] ([id number]) [id number] Page 10 of 12 Wounding Event: Not Known Status: Date Acquired: 03/17/2023 Comorbid Cataracts, Anemia, Arrhythmia, Congestive Heart Failure, Weeks Of Treatment: 0 History: Hypotension, Peripheral Venous Disease, Osteomyelitis, Clustered Wound: No Neuropathy Photos Wound Measurements Length: (cm) 0.7 Width: (cm) 0.9 Depth: (cm) 0.1 Area: (cm) 0.495 Volume: (cm) 0.049 % Reduction in Area: % Reduction in Volume: Epithelialization: Small (1-33%) Tunneling: No Undermining: No Wound Description Classification: Full Thickness Without Exposed Suppo Wound Margin: Flat and Intact Exudate Amount: Medium Exudate Type: Serous Exudate Color: amber rt Structures Foul Odor After Cleansing: No Slough/Fibrino Yes Wound Bed Granulation Amount: Small (1-33%) Exposed Structure Granulation Quality: Pink Fascia Exposed: No Necrotic Amount: Large (67-100%) Fat Layer (Subcutaneous Tissue) Exposed: Yes Necrotic Quality: Adherent Slough Tendon Exposed: No Muscle Exposed: No Joint Exposed: No Bone Exposed: No Periwound Skin  Texture Texture Color No Abnormalities Noted: Yes No Abnormalities Noted: No Erythema: Yes Moisture Erythema Location: Circumferential No Abnormalities Noted: Yes Temperature / Pain Temperature: No Abnormality Tenderness on Palpation: Yes Electronic Signature(s) Signed: 03/18/2023 5:46:42 PM By: Megan Deed RN, BSN Entered By: [name] on 03/18/2023 16:04:34 -------------------------------------------------------------------------------- Wound Assessment Details Patient Name: Date of Service: Megan Reap. 03/18/2023 3:30 PM Medical Record Number: [id number] Patient Account Number: [id number] Date of Birth/Sex: Treating RN: 1928-05-15 (89 y.o. F) Primary Care Shreshta Medley: [name] Other Clinician: Referring Langston Summerfield: Treating Larkin Morelos/Extender: Merceda Elks, Tereso Newcomer Weeks in Treatment: 1 Wound Status [name], [name] ([id number]) [id number] Page 11 of 12 Wound Number: 9 Primary Pressure Ulcer Etiology: Wound Location: Left Calcaneus Wound Open Wounding Event: Pressure Injury Status: Date Acquired: 10/22/2022 Comorbid Cataracts, Anemia, Arrhythmia, Congestive Heart Failure, Weeks Of Treatment: 1 History: Hypotension, Peripheral Venous Disease, Osteomyelitis, Clustered Wound: No Neuropathy Photos Wound Measurements Length: (cm) 0.6 Width: (cm) 0.9 Depth: (cm) 0.4 Area: (cm) 0.424 Volume: (cm) 0.17 % Reduction in Area: -28.5% % Reduction in Volume: -71.7% Epithelialization: None Tunneling: No Undermining: No Wound Description Classification: Category/Stage II Wound Margin: Distinct, outline attached Exudate Amount: Medium Exudate Type: Serosanguineous Exudate Color: red, brown Foul Odor After Cleansing: No Slough/Fibrino Yes Wound Bed Granulation Amount: Small (1-33%) Exposed Structure Granulation Quality: Red Fascia Exposed: No  Necrotic Amount: Large (67-100%) Fat Layer (Subcutaneous Tissue) Exposed:  Yes Necrotic Quality: Adherent Slough Tendon Exposed: No Muscle Exposed: No Joint Exposed: No Bone Exposed: No Periwound Skin Texture Texture Color No Abnormalities Noted: Yes No Abnormalities Noted: Yes Moisture Temperature / Pain No Abnormalities Noted: No Temperature: No Abnormality Dry / Scaly: Yes Tenderness on Palpation: Yes Treatment Notes Wound #9 (Calcaneus) Wound Laterality: Left Cleanser Soap and Water Discharge Instruction: May shower and wash wound with dial antibacterial soap and water prior to dressing change. Wound Cleanser Discharge Instruction: Cleanse the wound with wound cleanser prior to applying a clean dressing using gauze sponges, not tissue or cotton balls. Peri-Wound Care Topical Primary Dressing Promogran Prisma Matrix, 4.34 (sq in) (silver collagen) Discharge Instruction: Moisten collagen with saline or hydrogel Secondary Dressing ALLEVYN Heel 4 1/2in x 5 1/2in / 10.5cm x 13.5cm Discharge Instruction: Apply over primary dressing as directed. Woven Gauze Sponge, Non-Sterile 4x4 in [name], [name] ([id number]) [id number] Page 12 of 12 Discharge Instruction: Apply over primary dressing as directed. Secured With 22M Medipore H Soft Cloth Surgical T ape, 4 x 10 (in/yd) Discharge Instruction: Secure with tape as directed. Compression Wrap Compression Stockings Add-Ons Electronic Signature(s) Signed: 03/18/2023 5:46:42 PM By: Megan Deed RN, BSN Entered By: [name] on 03/18/2023 16:05:24 -------------------------------------------------------------------------------- Vitals Details Patient Name: Date of Service: Megan Reap. 03/18/2023 3:30 PM Medical Record Number: [id number] Patient Account Number: [id number] Date of Birth/Sex: Treating RN: October 27, 1927 (89 y.o. F) Primary Care Davina Howlett: [name] Other Clinician: Referring Nevaya Nagele: Treating Chamya Hunton/Extender: [name] Weeks in  Treatment: 1 Vital Signs Time Taken: 03:48 Temperature (F): 98.4 Height (in): 64 Pulse (bpm): 76 Weight (lbs): 157 Respiratory Rate (breaths/min): 18 Body Mass Index (BMI): 26.9 Blood Pressure (mmHg): 169/69 Reference Range: 80 - 120 mg / dl Electronic Signature(s) Signed: 03/18/2023 5:46:42 PM By: Megan Deed RN, BSN Entered By: [name] on 03/18/2023 15:59:49

## 2023-03-19 NOTE — Progress Notes (Signed)
[name], [name] ([id number]) [id number] Page 1 of 14 Visit Report for 03/18/2023 Chief Complaint Document Details Patient Name: Date of Service: [name] 03/18/2023 3:30 PM Medical Record Number: [id number] Patient Account Number: [id number] Date of Birth/Sex: Treating RN: 1928-03-25 (89 y.o. F) Primary Care Provider: [name] Other Clinician: Referring Provider: Treating Provider/Extender: Claudie Leach Weeks in Treatment: 1 Information Obtained from: Patient Chief Complaint 01/28/2022; right second toe amputation site dehiscence and bilateral lower extremity wounds. 03/10/2023: pressure ulcers of right heel, ulcers on toes Electronic Signature(s) Signed: 03/18/2023 4:25:44 PM By: Duanne Guess MD FACS Entered By: Duanne Guess on 03/18/2023 16:25:44 -------------------------------------------------------------------------------- Debridement Details Patient Name: Date of Service: Megan Reap. 03/18/2023 3:30 PM Medical Record Number: [id number] Patient Account Number: [id number] Date of Birth/Sex: Treating RN: [birth year] (89 y.o. [name] Primary Care Provider: [name] Other Clinician: Referring Provider: Treating Provider/Extender: Claudie Leach Weeks in Treatment: 1 Debridement Performed for Assessment: Wound #10 Left T Second oe Performed By: Physician Duanne Guess, MD Debridement Type: Debridement Level of Consciousness (Pre-procedure): Awake and Alert Pre-procedure Verification/Time Out Yes - 16:10 Taken: Start Time: 16:13 Pain Control: Lidocaine 4% T opical Solution Percent of Wound Bed Debrided: 100% T Area Debrided (cm): otal 0.71 Tissue and other material debrided: Non-Viable, Slough, Slough Level: Non-Viable Tissue Debridement Description: Selective/Open Wound Instrument: Curette Bleeding: Minimum Hemostasis Achieved: Pressure Procedural Pain: 0 Post Procedural  Pain: 0 Response to Treatment: Procedure was tolerated well Level of Consciousness (Post- Awake and Alert procedure): Post Debridement Measurements of Total Wound Length: (cm) 0.9 Stage: Category/Stage IV Width: (cm) 1 Depth: (cm) 0.1 Volume: (cm) 0.071 Character of Wound/Ulcer Post Debridement: Improved [name], [name] ([id number]) [id number] Page 2 of 14 Post Procedure Diagnosis Same as Pre-procedure Notes scribed for Dr. Lady Gary by Zenaida Deed, RN Electronic Signature(s) Signed: 03/18/2023 4:32:31 PM By: Duanne Guess MD FACS Signed: 03/18/2023 5:46:42 PM By: Zenaida Deed RN, BSN Entered By: Zenaida Deed on 03/18/2023 16:16:31 -------------------------------------------------------------------------------- Debridement Details Patient Name: Date of Service: Megan Reap. 03/18/2023 3:30 PM Medical Record Number: [id number] Patient Account Number: [id number] Date of Birth/Sex: Treating RN: 1928-05-16 (89 y.o. [name] Primary Care Provider: [name] Other Clinician: Referring Provider: Treating Provider/Extender: Claudie Leach Weeks in Treatment: 1 Debridement Performed for Assessment: Wound #12 Left T Great oe Performed By: Physician Duanne Guess, MD Debridement Type: Debridement Severity of Tissue Pre Debridement: Fat layer exposed Level of Consciousness (Pre-procedure): Awake and Alert Pre-procedure Verification/Time Out Yes - 16:10 Taken: Start Time: 16:13 Pain Control: Lidocaine 4% T opical Solution Percent of Wound Bed Debrided: 100% T Area Debrided (cm): otal 0.49 Tissue and other material debrided: Non-Viable, Slough, Slough Level: Non-Viable Tissue Debridement Description: Selective/Open Wound Instrument: Curette Bleeding: Minimum Hemostasis Achieved: Pressure Procedural Pain: 0 Post Procedural Pain: 0 Response to Treatment: Procedure was tolerated well Level of Consciousness (Post-  Awake and Alert procedure): Post Debridement Measurements of Total Wound Length: (cm) 0.7 Width: (cm) 0.9 Depth: (cm) 0.1 Volume: (cm) 0.049 Character of Wound/Ulcer Post Debridement: Improved Severity of Tissue Post Debridement: Fat layer exposed Post Procedure Diagnosis Same as Pre-procedure Notes scribed for Dr. Lady Gary by Zenaida Deed, RN Electronic Signature(s) Signed: 03/18/2023 4:32:31 PM By: Duanne Guess MD FACS Signed: 03/18/2023 5:46:42 PM By: Zenaida Deed RN, BSN Entered By: Zenaida Deed on 03/18/2023 16:17:06 [name], [name] ([id number]) [id number] Page 3 of 14 -------------------------------------------------------------------------------- Debridement Details Patient Name: Date of Service: [name], [name]. 03/18/2023 3:30 PM  Medical Record Number: [id number] Patient Account Number: [id number] Date of Birth/Sex: Treating RN: [birth year] (89 y.o. [name] Primary Care Provider: [name] Other Clinician: Referring Provider: Treating Provider/Extender: Claudie Leach Weeks in Treatment: 1 Debridement Performed for Assessment: Wound #11 Right T Third oe Performed By: Physician Duanne Guess, MD Debridement Type: Debridement Level of Consciousness (Pre-procedure): Awake and Alert Pre-procedure Verification/Time Out Yes - 16:10 Taken: Start Time: 16:13 Pain Control: Lidocaine 4% T opical Solution Percent of Wound Bed Debrided: 100% T Area Debrided (cm): otal 0.64 Tissue and other material debrided: Non-Viable, Slough, Slough Level: Non-Viable Tissue Debridement Description: Selective/Open Wound Instrument: Curette Bleeding: Minimum Hemostasis Achieved: Pressure Procedural Pain: 0 Post Procedural Pain: 0 Response to Treatment: Procedure was tolerated well Level of Consciousness (Post- Awake and Alert procedure): Post Debridement Measurements of Total Wound Length: (cm) 0.9 Stage:  Category/Stage IV Width: (cm) 0.9 Depth: (cm) 0.1 Volume: (cm) 0.064 Character of Wound/Ulcer Post Debridement: Improved Post Procedure Diagnosis Same as Pre-procedure Notes scribed for Dr. Lady Gary by Zenaida Deed, RN Electronic Signature(s) Signed: 03/18/2023 4:32:31 PM By: Duanne Guess MD FACS Signed: 03/18/2023 5:46:42 PM By: Zenaida Deed RN, BSN Entered By: Zenaida Deed on 03/18/2023 16:17:36 -------------------------------------------------------------------------------- Debridement Details Patient Name: Date of Service: Megan Reap. 03/18/2023 3:30 PM Medical Record Number: [id number] Patient Account Number: [id number] Date of Birth/Sex: Treating RN: 1928/05/19 (89 y.o. [name] Primary Care Provider: [name] Other Clinician: Referring Provider: Treating Provider/Extender: Claudie Leach Weeks in Treatment: 1 Debridement Performed for Assessment: Wound #9 Left Calcaneus Performed By: Physician Duanne Guess, MD Debridement Type: Debridement Level of Consciousness (Pre-procedure): Awake and Alert Pre-procedure Verification/Time Out Yes - 16:10 [name], [name] ([id number]) [id number] Page 4 of 14 Yes - 16:10 Taken: Start Time: 16:13 Pain Control: Lidocaine 4% T opical Solution Percent of Wound Bed Debrided: 100% T Area Debrided (cm): otal 0.42 Tissue and other material debrided: Viable, Non-Viable, Slough, Subcutaneous, Slough Level: Skin/Subcutaneous Tissue Debridement Description: Excisional Instrument: Curette Bleeding: Minimum Hemostasis Achieved: Pressure Procedural Pain: 0 Post Procedural Pain: 0 Response to Treatment: Procedure was tolerated well Level of Consciousness (Post- Awake and Alert procedure): Post Debridement Measurements of Total Wound Length: (cm) 0.6 Stage: Category/Stage II Width: (cm) 0.9 Depth: (cm) 0.4 Volume: (cm) 0.17 Character of Wound/Ulcer Post Debridement:  Improved Post Procedure Diagnosis Same as Pre-procedure Notes scribed for Dr. Lady Gary by Zenaida Deed, RN Electronic Signature(s) Signed: 03/18/2023 4:32:31 PM By: Duanne Guess MD FACS Signed: 03/18/2023 5:46:42 PM By: Zenaida Deed RN, BSN Entered By: Zenaida Deed on 03/18/2023 16:19:06 -------------------------------------------------------------------------------- HPI Details Patient Name: Date of Service: Megan Reap. 03/18/2023 3:30 PM Medical Record Number: [id number] Patient Account Number: [id number] Date of Birth/Sex: Treating RN: 11-22-1927 (89 y.o. F) Primary Care Provider: [name] Other Clinician: Referring Provider: Treating Provider/Extender: Claudie Leach Weeks in Treatment: 1 History of Present Illness HPI Description: Admission 01/28/2022 Ms. 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 She states she has tried Medihoney and silver alginate in the past to this area with little benefit. She also has 2 small areas limited to skin breakdown to her lower extremities bilaterally. She has chronic venous insufficiency but not has not been wearing her compression stockings.  She states she bumped her legs against an object and not so the wound started. She has been using Medihoney to the sites. She denies signs of infection. 6/2; patient presents for follow-up. She had an x-ray of her right foot done at last clinic visit and this was negative for evidence of osteomyelitis. She also had a wound culture done that showed extra high levels of Staph aureus. I recommended Keystone antibiotics for this and this was ordered. She had ABIs with TBI's done as well that showed  monophasic waveforms to the right foot with TBI of 0 and an ABI of 0.52. Urgent referral was made to vein and vascular and she saw Dr. Durwin Nora on 6/1, yesterday and he recommended an arteriogram. This is scheduled for 6/16. Patient also reports a new wound to the right great toe. This is a blister that has ruptured. She also reports increased erythema to the toe. 6/6; the patient was worked in urgently today at the insistence of her daughter out of concern for a new wound on the lateral part of the plantar right great toe. She has her original postsurgical wound after the amputation of the right second toe, she has a wound on the medial part of the right great toe. The patient is apparently going for an angiogram by Dr. Durwin Nora in 2 weeks time. 6/13; patient presents for follow-up. She has been using bacitracin to the abrasion on the right great toe. She has been using collagen and Keystone antibiotic to the amputation site. She has no issues or complaints today. She denies signs of infection. 6/22; patient presents for follow-up. She states that her abdominal aortogram was canceled due to her renal function. She has been using Keystone antibiotics to the amputation site and Medihoney to the right great toe wound. At the pace of the right great toe she has a slitlike open area that she thinks was caused by the tape from the dressing. [name], [name] ([id number]) [id number] Page 5 of 14 6/29; patient presents for follow-up. She has been using Keystone antibiotics to the amputation site along with collagen. She has been using Medihoney to the right great toe wound. She has no other wounds. She denies signs of infection. 7/13; patient presents for follow-up. She has been using Keystone antibiotics and collagen to the wound sites. She currently denies signs of infection. She states she is scheduled to see me nephrology next month. 7/20; patient presents for follow-up. She continue  Keystone antibiotics and collagen to the wound sites. She has no issues or complaints today. 8/1; patient presents for follow up. She continues to use keystone antibiotics and collagen to the wound sites. She has no issues or complaints today. 8/15; patient presents for follow-up. She has been using Keystone antibiotics and collagen to the wound sites. She followed up with her nephrologist who made medication changes. She is supposed to get a repeat BMP in 2 weeks. Her decrease in renal function was a limiting factor in obtaining an arteriogram for potential intervention for revascularization. She currently denies signs of infection. 9/2; patient presents for follow-up. She has been using Keystone antibiotic and collagen to the wound beds. She has no issues or complaints today. Reports there has been improvement in kidney function however not cleared to have her arteriogram just yet. 10/10; patient presents for follow-up. She has been using Keystone antibiotic and collagen to the wound beds. She reports 2 new wounds 1 to the anterior right lower extremity and another to the plantar  aspect of the right foot. She states that the right plantar foot wound was caused by the home health nurse changing the dressing and causing a skin tear. She is not sure how the right anterior leg wound started. It appears to be from trauma. She denies signs of infection. 10/19; patient presents for follow-up. She has been using Keystone antibiotic and collagen to the right great toe wound. She is been using silver alginate to the right anterior and right plantar foot wound. She has been using Tubigrip to the right lower extremity. The plantar foot wound has healed. She has no issues or complaints today. 11/3; since the patient was last here she was seen in urgent care apparently for an area on the dorsal aspect of the right fifth toe perhaps over the PIP. I saw a picture of this on the daughter's cell phone. There was  slough on this. Urgent care gave him doxycycline. She is also changed the dressing to all wounds back the [facility] and collagen which includes her right leg and left first toe 11/16; patient presents for follow-up. She has a new wound to the left knee. She states she fell. She has been using antibiotic ointment and collagen to this area. She has been using collagen and Keystone antibiotic to the right great toe wound. The anterior right leg wound is healed. She denies signs of infection. 11/30; patient presents for follow-up. The right great toe wound has healed. She has 1 remaining wound to the left knee. She has been using Hydrofera Blue and Medihoney here. 12/19; patient presents for follow-up. Her left knee wound has healed. She has no issues or complaints today. 09/30/2022 Patient's daughter called for an appointment due to increased swelling to her lower extremities bilaterally. Today patient presents with increased swelling to her lower extremities although there is no increased redness or warmth. She was evaluated by her nephrologist who increased her diuretics. Per patient and daughter her swelling has gone down to the leg Over the past several days. She does not have any open wounds. She was advised to elevate her legs and not consume excess salt By her PCP and nephrologist. Patient has compression stockings that she has been using sporadically. READMISSION 03/10/2023 Since her last visit to the clinic, the patient has been hospitalized at least twice, in February with COVID pneumonia and CHF exacerbation. She was discharged to a skilled nursing facility and then readmitted in April with fevers. She was noted to have pressure ulcers on her heels and sacrum. Per family declined skilled nursing placement upon discharge and she has apparently been residing at home. She has followed with the vascular surgery clinic regarding peripheral artery disease and due to ulcerations on her feet, she  ultimately underwent arteriography with balloon angioplasties of the superficial femoral artery, popliteal artery and peroneal artery on the left, along with shockwave ultrasound assisted balloon angioplasty of the popliteal artery and distal SFA. Given her multiple significant medical comorbidities, she is not a candidate for open surgery. She is deemed to be maximally vascularized this procedure, which was performed on 04 March 2023. T oday, she has a stage III pressure ulcer on her left heel and wounds on the PIP joints of her right third and left second toes. No sacral wound is present and the ulcer on her left heel has closed. 03/18/2023: She has a new ulcer on her left great toe. It was just noticed yesterday and the etiology is unknown. The fat layer is exposed and  there is some slough accumulation. The other wounds are all essentially unchanged in size. There is a little bit more granulation tissue on the other toe wounds, but bone is still frankly exposed on both sides. The heel ulcer has thick slough accumulation. Electronic Signature(s) Signed: 03/18/2023 4:26:53 PM By: Duanne Guess MD FACS Entered By: Duanne Guess on 03/18/2023 16:26:53 -------------------------------------------------------------------------------- Physical Exam Details Patient Name: Date of Service: Megan Reap. 03/18/2023 3:30 PM Medical Record Number: [id number] Patient Account Number: [id number] Date of Birth/Sex: Treating RN: [birth year] (89 y.o. F) Primary Care Provider: [name] Other Clinician: Referring Provider: Treating Provider/Extender: Claudie Leach Weeks in Treatment: 1 Constitutional Hypertensive, asymptomatic. . . . no acute distress. Respiratory Normal work of breathing on supplemental oxygen. CHRISA, [name] ([id number]) [id number] Page 6 of 14 Notes 03/18/2023: She has a new ulcer on her left great toe. It was just noticed yesterday and  the etiology is unknown. The fat layer is exposed and there is some slough accumulation. The other wounds are all essentially unchanged in size. There is a little bit more granulation tissue on the other toe wounds, but bone is still frankly exposed on both sides. The heel ulcer has thick slough accumulation. Electronic Signature(s) Signed: 03/18/2023 4:29:20 PM By: Duanne Guess MD FACS Previous Signature: 03/18/2023 4:27:35 PM Version By: Duanne Guess MD FACS Entered By: Duanne Guess on 03/18/2023 16:29:20 -------------------------------------------------------------------------------- Physician Orders Details Patient Name: Date of Service: Megan Reap. 03/18/2023 3:30 PM Medical Record Number: [id number] Patient Account Number: [id number] Date of Birth/Sex: Treating RN: March 30, 1928 (89 y.o. 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 - Dr. Lady Gary - room 1 Return A Thursday 7/18 @ 1:15 pm Anesthetic (In clinic) Topical Lidocaine 4% applied to wound bed Bathing/ Shower/ Hygiene May shower and wash wound with soap and water. Edema Control - Lymphedema / SCD / Other Elevate legs to the  level of the heart or above for 30 minutes daily and/or when sitting for 3-4 times a day throughout the day. Avoid standing for long periods of time. Patient to wear own compression stockings every day. Additional Orders / Instructions Follow Nutritious Diet - add in protein shakes every day to diet - recommend premier protein 500 mg x3 a day vitamin C, zinc 30-50 mg per day Home Health No change in wound care orders this week; continue Home Health for wound care. May utilize formulary equivalent dressing for wound treatment orders unless otherwise specified. Dressing changes to be completed by Home Health on Monday / Wednesday / Friday except when patient has scheduled visit at [facility]. Other Home Health Orders/Instructions: - [facility] Home Wound Treatment Wound #10 - T Second oe Wound Laterality: Left Cleanser: Soap and Water 1 x Per Day/30 Days Discharge Instructions: May shower and wash wound with dial antibacterial soap and water prior to dressing  change. Cleanser: Wound Cleanser 1 x Per Day/30 Days Discharge Instructions: Cleanse the wound with wound cleanser prior to applying a clean dressing using gauze sponges, not tissue or cotton balls. KARSYN, JAMIE ([id number]) [id number] Page 7 of 14 Prim Dressing: Promogran Prisma Matrix, 4.34 (sq in) (silver collagen) (Generic) 1 x Per Day/30 Days ary Discharge Instructions: Moisten collagen with saline or hydrogel Secondary Dressing: Woven Gauze Sponge, Non-Sterile 4x4 in (Generic) 1 x Per Day/30 Days Discharge Instructions: Apply over primary dressing as directed. Secured With: 76M Medipore H Soft Cloth Surgical T ape, 4 x 10 (in/yd) (Generic) 1 x Per Day/30 Days Discharge Instructions: Secure with tape as directed. Wound #11 - T Third oe Wound Laterality: Right Cleanser: Soap and Water 1 x Per Day/30 Days Discharge Instructions: May shower and wash wound with dial antibacterial soap and water prior to  dressing change. Cleanser: Wound Cleanser 1 x Per Day/30 Days Discharge Instructions: Cleanse the wound with wound cleanser prior to applying a clean dressing using gauze sponges, not tissue or cotton balls. Prim Dressing: Promogran Prisma Matrix, 4.34 (sq in) (silver collagen) (Generic) 1 x Per Day/30 Days ary Discharge Instructions: Moisten collagen with saline or hydrogel Secondary Dressing: Woven Gauze Sponge, Non-Sterile 4x4 in (Generic) 1 x Per Day/30 Days Discharge Instructions: Apply over primary dressing as directed. Secured With: 76M Medipore H Soft Cloth Surgical T ape, 4 x 10 (in/yd) (Generic) 1 x Per Day/30 Days Discharge Instructions: Secure with tape as directed. Wound #9 - Calcaneus Wound Laterality: Left Cleanser: Soap and Water 1 x Per Day/30 Days Discharge Instructions: May shower and wash wound with dial antibacterial soap and water prior to dressing change. Cleanser: Wound Cleanser 1 x Per Day/30 Days Discharge Instructions: Cleanse the wound with wound cleanser prior to applying a clean dressing using gauze sponges, not tissue or cotton balls. Prim Dressing: Promogran Prisma Matrix, 4.34 (sq in) (silver collagen) (Generic) 1 x Per Day/30 Days ary Discharge Instructions: Moisten collagen with saline or hydrogel Secondary Dressing: ALLEVYN Heel 4 1/2in x 5 1/2in / 10.5cm x 13.5cm (Generic) 1 x Per Day/30 Days Discharge Instructions: Apply over primary dressing as directed. Secondary Dressing: Woven Gauze Sponge, Non-Sterile 4x4 in (Generic) 1 x Per Day/30 Days Discharge Instructions: Apply over primary dressing as directed. Secured With: 76M Medipore H Soft Cloth Surgical T ape, 4 x 10 (in/yd) (Generic) 1 x Per Day/30 Days Discharge Instructions: Secure with tape as directed. Electronic Signature(s) Signed: 03/18/2023 4:32:31 PM By: Duanne Guess MD FACS Entered By: Duanne Guess on 03/18/2023  16:29:35 -------------------------------------------------------------------------------- Problem List Details Patient Name: Date of Service: Megan Reap. 03/18/2023 3:30 PM Medical Record Number: [id number] Patient Account Number: [id number] Date of Birth/Sex: Treating RN: [birth year] (89 y.o. 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 03/10/2023 No Yes without evidence of necrosis 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 03/10/2023 No Yes without evidence of necrosis L97.522 Non-pressure chronic ulcer of other part of left foot with fat layer exposed 03/18/2023 No Yes I73.9 Peripheral vascular disease, unspecified 03/10/2023 No Yes I50.32 Chronic diastolic (congestive) heart failure 03/10/2023 No Yes N18.32 Chronic kidney disease, stage  3b 03/10/2023 No Yes I87.2 Venous insufficiency (chronic) (peripheral) 03/10/2023 No Yes Inactive Problems Resolved Problems Electronic Signature(s) Signed: 03/18/2023 4:25:26 PM By: Duanne Guess MD FACS Entered By: Duanne Guess on 03/18/2023 16:25:26 -------------------------------------------------------------------------------- Progress Note Details Patient Name: Date of Service: Megan Reap. 03/18/2023 3:30 PM Medical Record Number: [id number] Patient Account Number: [id number] Date of Birth/Sex: Treating RN: 04-03-1928 (89 y.o. F) Primary Care Provider: [name] Other Clinician: Referring Provider: Treating Provider/Extender: Claudie Leach Weeks in  Treatment: 1 Subjective Chief Complaint Information obtained from Patient 01/28/2022; right second toe amputation site dehiscence and bilateral lower extremity wounds. 03/10/2023: pressure ulcers of right heel, ulcers on toes History of Present Illness (HPI) Admission 01/28/2022 Ms. Tona Qualley is a 89 year old female with a past medical history of idiopathic peripheral neuropathy status post amputation to the second right toe secondary to osteomyelitis, COPD and A-fib on Coumadin the presents to the clinic for a 44-month history of nonhealing ulcer to a previous amputation site on the second right toe. She states she has tried Medihoney and silver alginate in the past to this area with little benefit. She also has 2 small areas limited to skin breakdown to her lower extremities bilaterally. She has chronic venous insufficiency but not has not been wearing her compression stockings. She states she bumped her legs against an object and not so the wound started. She has been using Medihoney to the sites. She denies signs of infection. 6/2; patient presents for follow-up. She had an x-ray of her right foot done at last clinic visit and this was negative for evidence of osteomyelitis. She also had a wound culture done that showed extra high levels of Staph aureus. I recommended Keystone antibiotics for this and this was ordered. She had ABIs with TBI's done as well that showed monophasic waveforms to the right foot with TBI of 0 and an ABI of 0.52. Urgent referral was made to vein and vascular and she saw Dr. Durwin Nora on 6/1, yesterday and he recommended an arteriogram. This is scheduled for 6/16. Patient also reports a new wound to the right great toe. This is a blister that has ruptured. She also reports increased erythema to the toe. [name], [name] ([id number]) [id number] Page 9 of 14 6/6; the patient was worked in urgently today at the insistence of her daughter out of concern  for a new wound on the lateral part of the plantar right great toe. She has her original postsurgical wound after the amputation of the right second toe, she has a wound on the medial part of the right great toe. The patient is apparently going for an angiogram by Dr. Durwin Nora in 2 weeks time. 6/13; patient presents for follow-up. She has been using bacitracin to the abrasion on the right great toe. She has been using collagen and Keystone antibiotic to the amputation site. She has no issues or complaints today. She denies signs of infection. 6/22; patient presents for follow-up. She states that her abdominal aortogram was canceled due to her renal function. She has been using Keystone antibiotics to the amputation site and Medihoney to the right great toe wound. At the pace of the right great toe she has a slitlike open area that she thinks was caused by the tape from the dressing. 6/29; patient presents for follow-up. She has been using Keystone antibiotics to the amputation site along with collagen. She has been using Medihoney to the right great toe wound. She has no other wounds. She  denies signs of infection. 7/13; patient presents for follow-up. She has been using Keystone antibiotics and collagen to the wound sites. She currently denies signs of infection. She states she is scheduled to see me nephrology next month. 7/20; patient presents for follow-up. She continue Keystone antibiotics and collagen to the wound sites. She has no issues or complaints today. 8/1; patient presents for follow up. She continues to use keystone antibiotics and collagen to the wound sites. She has no issues or complaints today. 8/15; patient presents for follow-up. She has been using Keystone antibiotics and collagen to the wound sites. She followed up with her nephrologist who made medication changes. She is supposed to get a repeat BMP in 2 weeks. Her decrease in renal function was a limiting factor in obtaining an  arteriogram for potential intervention for revascularization. She currently denies signs of infection. 9/2; patient presents for follow-up. She has been using Keystone antibiotic and collagen to the wound beds. She has no issues or complaints today. Reports there has been improvement in kidney function however not cleared to have her arteriogram just yet. 10/10; patient presents for follow-up. She has been using Keystone antibiotic and collagen to the wound beds. She reports 2 new wounds 1 to the anterior right lower extremity and another to the plantar aspect of the right foot. She states that the right plantar foot wound was caused by the home health nurse changing the dressing and causing a skin tear. She is not sure how the right anterior leg wound started. It appears to be from trauma. She denies signs of infection. 10/19; patient presents for follow-up. She has been using Keystone antibiotic and collagen to the right great toe wound. She is been using silver alginate to the right anterior and right plantar foot wound. She has been using Tubigrip to the right lower extremity. The plantar foot wound has healed. She has no issues or complaints today. 11/3; since the patient was last here she was seen in urgent care apparently for an area on the dorsal aspect of the right fifth toe perhaps over the PIP. I saw a picture of this on the daughter's cell phone. There was slough on this. Urgent care gave him doxycycline. She is also changed the dressing to all wounds back the [facility] and collagen which includes her right leg and left first toe 11/16; patient presents for follow-up. She has a new wound to the left knee. She states she fell. She has been using antibiotic ointment and collagen to this area. She has been using collagen and Keystone antibiotic to the right great toe wound. The anterior right leg wound is healed. She denies signs of infection. 11/30; patient presents for follow-up. The right  great toe wound has healed. She has 1 remaining wound to the left knee. She has been using Hydrofera Blue and Medihoney here. 12/19; patient presents for follow-up. Her left knee wound has healed. She has no issues or complaints today. 09/30/2022 Patient's daughter called for an appointment due to increased swelling to her lower extremities bilaterally. Today patient presents with increased swelling to her lower extremities although there is no increased redness or warmth. She was evaluated by her nephrologist who increased her diuretics. Per patient and daughter her swelling has gone down to the leg Over the past several days. She does not have any open wounds. She was advised to elevate her legs and not consume excess salt By her PCP and nephrologist. Patient has compression stockings that she has  been using sporadically. READMISSION 03/10/2023 Since her last visit to the clinic, the patient has been hospitalized at least twice, in February with COVID pneumonia and CHF exacerbation. She was discharged to a skilled nursing facility and then readmitted in April with fevers. She was noted to have pressure ulcers on her heels and sacrum. Per family declined skilled nursing placement upon discharge and she has apparently been residing at home. She has followed with the vascular surgery clinic regarding peripheral artery disease and due to ulcerations on her feet, she ultimately underwent arteriography with balloon angioplasties of the superficial femoral artery, popliteal artery and peroneal artery on the left, along with shockwave ultrasound assisted balloon angioplasty of the popliteal artery and distal SFA. Given her multiple significant medical comorbidities, she is not a candidate for open surgery. She is deemed to be maximally vascularized this procedure, which was performed on 04 March 2023. T oday, she has a stage III pressure ulcer on her left heel and wounds on the PIP joints of her right third  and left second toes. No sacral wound is present and the ulcer on her left heel has closed. 03/18/2023: She has a new ulcer on her left great toe. It was just noticed yesterday and the etiology is unknown. The fat layer is exposed and there is some slough accumulation. The other wounds are all essentially unchanged in size. There is a little bit more granulation tissue on the other toe wounds, but bone is still frankly exposed on both sides. The heel ulcer has thick slough accumulation. Patient History Information obtained from Patient, Caregiver, Chart. Family History Cancer - Siblings, Heart Disease, Stroke - Mother, No family history of Diabetes. Social History Former smoker - quit 2003, Marital Status - Widowed, Alcohol Use - Never, Drug Use - No History, Caffeine Use - Never. Medical History Eyes Patient has history of Cataracts Hematologic/Lymphatic Patient has history of Anemia Respiratory Denies history of Chronic Obstructive Pulmonary Disease (COPD) Cardiovascular Patient has history of Arrhythmia - a-fib, Congestive Heart Failure, Hypotension, Peripheral Venous Disease Musculoskeletal [name], [name] ([id number]) [id number] Page 10 of 14 Patient has history of 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 Neurologic Patient has history of Neuropathy Hospitalization/Surgery History - Abdominal aortogram w/lower extremity. - Amputation toe (Right). - Colonoscopy. - Elbow surgery. - Laparoscopic hysterectomy. Medical A Surgical History Notes nd Hematologic/Lymphatic Thrombocytopenia, Anticoagulant long-term use Respiratory Pulmonary embolus Gastrointestinal Hiatal hernia Endocrine Hyperthyroidism, Hypothyroidism Genitourinary CKD stage III Musculoskeletal arthritis Objective Constitutional Hypertensive, asymptomatic. no acute distress. Vitals Time Taken: 3:48 AM, Height: 64 in, Weight: 157 lbs, BMI: 26.9, Temperature: 98.4 F, Pulse: 76  bpm, Respiratory Rate: 18 breaths/min, Blood Pressure: 169/69 mmHg. Respiratory Normal work of breathing on supplemental oxygen. General Notes: 03/18/2023: She has a new ulcer on her left great toe. It was just noticed yesterday and the etiology is unknown. The fat layer is exposed and there is some slough accumulation. The other wounds are all essentially unchanged in size. There is a little bit more granulation tissue on the other toe wounds, but bone is still frankly exposed on both sides. The heel ulcer has thick slough accumulation. Integumentary (Hair, Skin) Wound #10 status is Open. Original cause of wound was Pressure Injury. The date acquired was: 10/09/2022. The wound has been in treatment 1 weeks. The wound is located on the Left T Second. The wound measures 0.9cm length x 1cm width x 0.1cm depth; 0.707cm^2 area and 0.071cm^3 volume. There is oe bone, joint, and Fat Layer (Subcutaneous  Tissue) exposed. There is no tunneling or undermining noted. There is a small amount of serosanguineous drainage noted. The wound margin is distinct with the outline attached to the wound base. There is large (67-100%) red granulation within the wound bed. There is a small (1-33%) amount of necrotic tissue within the wound bed including Adherent Slough. The periwound skin appearance had no abnormalities noted for texture. The periwound skin appearance had no abnormalities noted for moisture. The periwound skin appearance had no abnormalities noted for color. Periwound temperature was noted as No Abnormality. Wound #11 status is Open. Original cause of wound was Pressure Injury. The date acquired was: 10/09/2022. The wound has been in treatment 1 weeks. The wound is located on the Right T Third. The wound measures 0.9cm length x 0.9cm width x 0.1cm depth; 0.636cm^2 area and 0.064cm^3 volume. There is oe bone, joint, and Fat Layer (Subcutaneous Tissue) exposed. There is no tunneling or undermining noted. There  is a medium amount of serosanguineous drainage noted. The wound margin is distinct with the outline attached to the wound base. There is medium (34-66%) pink granulation within the wound bed. There is a medium (34-66%) amount of necrotic tissue within the wound bed including Adherent Slough. The periwound skin appearance had no abnormalities noted for texture. The periwound skin appearance had no abnormalities noted for moisture. The periwound skin appearance had no abnormalities noted for color. Periwound temperature was noted as No Abnormality. Wound #12 status is Open. Original cause of wound was Not Known. The date acquired was: 03/17/2023. The wound is located on the Left T Great. The wound oe measures 0.7cm length x 0.9cm width x 0.1cm depth; 0.495cm^2 area and 0.049cm^3 volume. There is Fat Layer (Subcutaneous Tissue) exposed. There is no tunneling or undermining noted. There is a medium amount of serous drainage noted. The wound margin is flat and intact. There is small (1-33%) pink granulation within the wound bed. There is a large (67-100%) amount of necrotic tissue within the wound bed including Adherent Slough. The periwound skin appearance had no abnormalities noted for texture. The periwound skin appearance had no abnormalities noted for moisture. The periwound skin appearance exhibited: Erythema. The surrounding wound skin color is noted with erythema which is circumferential. Periwound temperature was noted as No Abnormality. The periwound has tenderness on palpation. Wound #9 status is Open. Original cause of wound was Pressure Injury. The date acquired was: 10/22/2022. The wound has been in treatment 1 weeks. The wound is located on the Left Calcaneus. The wound measures 0.6cm length x 0.9cm width x 0.4cm depth; 0.424cm^2 area and 0.17cm^3 volume. There is Fat Layer (Subcutaneous Tissue) exposed. There is no tunneling or undermining noted. There is a medium amount of serosanguineous  drainage noted. The wound margin is distinct with the outline attached to the wound base. There is small (1-33%) red granulation within the wound bed. There is a large (67-100%) amount of necrotic tissue within the wound bed including Adherent Slough. The periwound skin appearance had no abnormalities noted for texture. The periwound skin appearance had no abnormalities noted for color. The periwound skin appearance exhibited: Dry/Scaly. Periwound temperature was noted as No Abnormality. The periwound has tenderness on palpation. 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 There was a Selective/Open Wound Non-Viable Tissue oe Debridement with a total area of 0.71 sq cm performed by Duanne Guess, MD. With the following instrument(s): Curette to remove Non-Viable tissue/material. Material removed includes [facility] after achieving pain control using Lidocaine 4% Topical Solution. No specimens were taken. A time out was conducted at 16:10, prior to the start of the procedure. A Minimum amount of bleeding was controlled with Pressure. The procedure was tolerated well with a pain level of 0 throughout and a pain level of 0 following the procedure. Post Debridement Measurements: 0.9cm length x 1cm width x 0.1cm depth; 0.071cm^3 volume. Post  debridement Stage noted as Category/Stage IV. Character of Wound/Ulcer Post Debridement is improved. Post procedure Diagnosis Wound #10: Same as Pre-Procedure General Notes: scribed for Dr. Lady Gary by Zenaida Deed, RN. Wound #11 Pre-procedure diagnosis of Wound #11 is a Pressure Ulcer located on the Right T Third . There was a Selective/Open Wound Non-Viable Tissue Debridement oe with a total area of 0.64 sq cm performed by Duanne Guess, MD. With the following instrument(s): Curette to remove Non-Viable tissue/material. Material removed includes [facility] after achieving pain control using Lidocaine 4% Topical Solution. No specimens were taken. A time out was conducted at 16:10, prior to the start of the procedure. A Minimum amount of bleeding was controlled with Pressure. The procedure was tolerated well with a pain level of 0 throughout and a pain level of 0 following the procedure. Post Debridement Measurements: 0.9cm length x 0.9cm width x 0.1cm depth; 0.064cm^3 volume. Post debridement Stage noted as Category/Stage IV. Character of Wound/Ulcer Post Debridement is improved. Post procedure Diagnosis Wound #11: Same as Pre-Procedure General Notes: scribed for Dr. Lady Gary by Zenaida Deed, RN. Wound #12 Pre-procedure diagnosis of Wound #12 is an Arterial Insufficiency Ulcer located on the Left T Great .Severity of Tissue Pre Debridement is: Fat layer oe exposed. There was a Selective/Open Wound Non-Viable Tissue Debridement with a total area of 0.49 sq cm performed by Duanne Guess, MD. With the following instrument(s): Curette to remove Non-Viable tissue/material. Material removed includes [facility] after achieving pain control using Lidocaine 4% Topical Solution. No specimens were taken. A time out was conducted at 16:10, prior to the start of the procedure. A Minimum amount of bleeding was controlled with Pressure. The procedure was tolerated well with a pain level of 0 throughout and a  pain level of 0 following the procedure. Post Debridement Measurements: 0.7cm length x 0.9cm width x 0.1cm depth; 0.049cm^3 volume. Character of Wound/Ulcer Post Debridement is improved. Severity of Tissue Post Debridement is: Fat layer exposed. Post procedure Diagnosis Wound #12: Same as Pre-Procedure General Notes: scribed for Dr. Lady Gary by Zenaida Deed, RN. Wound #9 Pre-procedure diagnosis of Wound #9 is a Pressure Ulcer located on the Left Calcaneus . There was a Excisional Skin/Subcutaneous Tissue Debridement with a total area of 0.42 sq cm performed by Duanne Guess, MD. With the following instrument(s): Curette to remove Viable and Non-Viable tissue/material. Material removed includes Subcutaneous Tissue and Slough and after achieving pain control using Lidocaine 4% T opical Solution. No specimens were taken. A time out was conducted at 16:10, prior to the start of the procedure. A Minimum amount of bleeding  was controlled with Pressure. The procedure was tolerated well with a pain level of 0 throughout and a pain level of 0 following the procedure. Post Debridement Measurements: 0.6cm length x 0.9cm width x 0.4cm depth; 0.17cm^3 volume. Post debridement Stage noted as Category/Stage II. Character of Wound/Ulcer Post Debridement is improved. Post procedure Diagnosis Wound #9: Same as Pre-Procedure General Notes: scribed for Dr. Lady Gary by Zenaida Deed, RN. Plan Follow-up Appointments: Return Appointment in 1 week. - Dr. Lady Gary - room 1 Thursday 7/18 @ 1:15 pm Anesthetic: (In clinic) Topical Lidocaine 4% applied to wound bed Bathing/ Shower/ Hygiene: May shower and wash wound with soap and water. Edema Control - Lymphedema / SCD / Other: Elevate legs to the level of the heart or above for 30 minutes daily and/or when sitting for 3-4 times a day throughout the day. Avoid standing for long periods of time. Patient to wear own compression stockings every day. Additional Orders /  Instructions: Follow Nutritious Diet - add in protein shakes every day to diet - recommend premier protein 500 mg x3 a day vitamin C, zinc 30-50 mg per day Home Health: No change in wound care orders this week; continue Home Health for wound care. May utilize formulary equivalent dressing for wound treatment orders unless otherwise specified. Dressing changes to be completed by Home Health on Monday / Wednesday / Friday except when patient has scheduled visit at [facility]. Other Home Health Orders/Instructions: - Medi Home WOUND #10: - T Second Wound Laterality: Left oe Cleanser: Soap and Water 1 x Per Day/30 Days Discharge Instructions: May shower and wash wound with dial antibacterial soap and water prior to dressing change. Cleanser: Wound Cleanser 1 x Per Day/30 Days Discharge Instructions: Cleanse the wound with wound cleanser prior to applying a clean dressing using gauze sponges, not tissue or cotton balls. Prim Dressing: Promogran Prisma Matrix, 4.34 (sq in) (silver collagen) (Generic) 1 x Per Day/30 Days ary Discharge Instructions: Moisten collagen with saline or hydrogel Secondary Dressing: Woven Gauze Sponge, Non-Sterile 4x4 in (Generic) 1 x Per Day/30 Days [name], [name] [name] ([id number]) [id number] Page 12 of 14 Discharge Instructions: Apply over primary dressing as directed. Secured With: 28M Medipore H Soft Cloth Surgical T ape, 4 x 10 (in/yd) (Generic) 1 x Per Day/30 Days Discharge Instructions: Secure with tape as directed. WOUND #11: - T Third Wound Laterality: Right oe Cleanser: Soap and Water 1 x Per Day/30 Days Discharge Instructions: May shower and wash wound with dial antibacterial soap and water prior to dressing change. Cleanser: Wound Cleanser 1 x Per Day/30 Days Discharge Instructions: Cleanse the wound with wound cleanser prior to applying a clean dressing using gauze sponges, not tissue or cotton balls. Prim Dressing: Promogran  Prisma Matrix, 4.34 (sq in) (silver collagen) (Generic) 1 x Per Day/30 Days ary Discharge Instructions: Moisten collagen with saline or hydrogel Secondary Dressing: Woven Gauze Sponge, Non-Sterile 4x4 in (Generic) 1 x Per Day/30 Days Discharge Instructions: Apply over primary dressing as directed. Secured With: 28M Medipore H Soft Cloth Surgical T ape, 4 x 10 (in/yd) (Generic) 1 x Per Day/30 Days Discharge Instructions: Secure with tape as directed. WOUND #9: - Calcaneus Wound Laterality: Left Cleanser: Soap and Water 1 x Per Day/30 Days Discharge Instructions: May shower and wash wound with dial antibacterial soap and water prior to dressing change. Cleanser: Wound Cleanser 1 x Per Day/30 Days Discharge Instructions: Cleanse the wound with wound cleanser prior to applying a clean dressing using gauze sponges, not tissue or  cotton balls. Prim Dressing: Promogran Prisma Matrix, 4.34 (sq in) (silver collagen) (Generic) 1 x Per Day/30 Days ary Discharge Instructions: Moisten collagen with saline or hydrogel Secondary Dressing: ALLEVYN Heel 4 1/2in x 5 1/2in / 10.5cm x 13.5cm (Generic) 1 x Per Day/30 Days Discharge Instructions: Apply over primary dressing as directed. Secondary Dressing: Woven Gauze Sponge, Non-Sterile 4x4 in (Generic) 1 x Per Day/30 Days Discharge Instructions: Apply over primary dressing as directed. Secured With: 53M Medipore H Soft Cloth Surgical T ape, 4 x 10 (in/yd) (Generic) 1 x Per Day/30 Days Discharge Instructions: Secure with tape as directed. 03/18/2023: She has a new ulcer on her left great toe. It was just noticed yesterday and the etiology is unknown. The fat layer is exposed and there is some slough accumulation. The other wounds are all essentially unchanged in size. There is a little bit more granulation tissue on the other toe wounds, but bone is still frankly exposed on both sides. The heel ulcer has thick slough accumulation. I used a curette to debride slough  from the new ulcer on her left great toe, as well as the pre-existing ulcers on her toe joints. I debrided slough and subcutaneous tissue from the heel. We will continue Prisma silver collagen to all sites. Continue to float the heel. She is taking vitamin C and zinc as recommended and her daughter says she plans to pick up some protein shakes later this week. She has follow-up with vascular surgery in a couple of weeks and they may choose to intervene on the right leg at that point. She will follow-up here in 1 week's time. Electronic Signature(s) Signed: 03/18/2023 4:31:16 PM By: Duanne Guess MD FACS Entered By: Duanne Guess on 03/18/2023 16:31:16 -------------------------------------------------------------------------------- HxROS Details Patient Name: Date of Service: Megan Reap. 03/18/2023 3:30 PM Medical Record Number: [id number] Patient Account Number: [id number] Date of Birth/Sex: Treating RN: [birth year] (89 y.o. F) Primary Care Provider: [name] Other Clinician: Referring Provider: Treating Provider/Extender: Tiajuana Amass in Treatment: 1 Information Obtained From Patient Caregiver Chart Eyes Medical History: Positive for: Cataracts Hematologic/Lymphatic Medical History: Positive for: Anemia Past Medical History Notes: Thrombocytopenia, Anticoagulant long-term use Respiratory Medical History: Negative for: Chronic Obstructive Pulmonary Disease (COPD) Past Medical History NotesKIMBLY, EANES ([id number]) [id number] Page 13 of 14 Pulmonary embolus Cardiovascular Medical History: Positive for: Arrhythmia - a-fib; Congestive Heart Failure; Hypotension; Peripheral Venous Disease Gastrointestinal Medical History: Past Medical History Notes: Hiatal hernia Endocrine Medical History: Past Medical History Notes: Hyperthyroidism, Hypothyroidism Genitourinary Medical History: Past Medical History  Notes: CKD stage III Musculoskeletal Medical History: Positive for: 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 Past Medical History Notes: arthritis Neurologic Medical History: Positive for: Neuropathy HBO Extended History Items Eyes: Cataracts Immunizations Pneumococcal Vaccine: Received Pneumococcal Vaccination: No Implantable Devices None Hospitalization / Surgery History Type of Hospitalization/Surgery Abdominal aortogram w/lower extremity Amputation toe (Right) Colonoscopy Elbow surgery Laparoscopic hysterectomy Family and Social History Cancer: Yes - Siblings; Diabetes: No; Heart Disease: Yes; Stroke: Yes - Mother; Former smoker - quit 2003; Marital Status - Widowed; Alcohol Use: Never; Drug Use: No History; Caffeine Use: Never; Financial Concerns: No; Food, Clothing or Shelter Needs: No; Support System Lacking: No; Transportation Concerns: No Electronic Signature(s) Signed: 03/18/2023 4:32:31 PM By: Duanne Guess MD FACS Entered By: Duanne Guess on 03/18/2023 16:27:01 SuperBill Details -------------------------------------------------------------------------------- Modena Morrow ([id number]) [id number] Page 14 of 14 Patient Name: Date of Service: STEPHAINE, BRESHEARS 03/18/2023 Medical Record Number: [id number] Patient Account Number:  [id number] Date of Birth/Sex: Treating RN: Jun 21, 1928 (89 y.o. 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 without evidence of necrosis L97.522 Non-pressure chronic ulcer of other part  of left foot with fat layer exposed I73.9 Peripheral vascular disease, unspecified I50.32 Chronic diastolic (congestive) heart failure N18.32 Chronic kidney disease, stage 3b I87.2 Venous insufficiency (chronic) (peripheral) Facility Procedures CPT4 Code Description Modifier Quantity [id number] 11042 - DEB SUBQ TISSUE 20 SQ CM/< 1 ICD-10 Diagnosis Description L89.623 Pressure ulcer of left heel, stage 3 [id number] - DEBRIDE WOUND 1ST 20 SQ CM OR < 1 ICD-10 Diagnosis Description 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 without evidence of necrosis L97.522 Non-pressure chronic ulcer of other part of left foot with fat layer exposed Physician Procedures Quantity CPT4 Code Description Modifier [id number] 11042 - WC PHYS SUBQ TISS 20 SQ CM 1 ICD-10 Diagnosis Description L89.623 Pressure ulcer of left heel, stage 3 [id number] - WC PHYS DEBR WO ANESTH 20 SQ CM 1 ICD-10 Diagnosis Description 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

## 2023-03-26 ENCOUNTER — Encounter (HOSPITAL_BASED_OUTPATIENT_CLINIC_OR_DEPARTMENT_OTHER): Payer: Medicare HMO | Admitting: General Surgery

## 2023-03-26 DIAGNOSIS — L89623 Pressure ulcer of left heel, stage 3: Secondary | ICD-10-CM | POA: Diagnosis not present

## 2023-03-26 NOTE — Progress Notes (Signed)
Megan, [name] ([id number]) [id number] Page 1 of 19 Visit Report for 03/26/2023 Arrival Information Details Patient Name: Date of Service: [name], [name]. 03/26/2023 1:15 PM Medical Record Number: [id number] Patient Account Number: [id number] Date of Birth/Sex: Treating RN: 1927/12/12 (89 y.o. F) Primary Care Tayja Manzer: [name] Other Clinician: Referring Capone Schwinn: Treating Ryin Ambrosius/Extender: Tiajuana Amass in Treatment: 2 Visit Information History Since Last Visit All ordered tests and consults were completed: No Patient Arrived: Wheel Chair Added or deleted any medications: No Arrival Time: 13:08 Any new allergies or adverse reactions: No Accompanied By: daughter Had a fall or experienced change in No Transfer Assistance: None activities of daily living that may affect Patient Identification Verified: Yes risk of falls: Secondary Verification Process Completed: Yes Signs or symptoms of abuse/neglect since last visito No Patient Has Alerts: Yes Hospitalized since last visit: No Patient Alerts: ABI R: 0.48 L: 0.46 2/24 Implantable device outside of the clinic excluding No TBI R: 0.39 L: 0.21 2/24 cellular tissue based products placed in the center since last visit: Pain Present Now: No Electronic Signature(s) Signed: 03/26/2023 1:47:42 PM By: Dayton Scrape Entered By: Dayton Scrape on 03/26/2023 13:08:53 -------------------------------------------------------------------------------- Encounter Discharge Information Details Patient Name: Date of Service: [name]. 03/26/2023 1:15 PM Medical Record Number: [id number] Patient Account Number: [id number] Date of Birth/Sex: Treating RN: December 13, 1927 (89 y.o. [name] Primary Care Sheril Hammond: [name] Other Clinician: Referring Clifton Safley: Treating Raysa Bosak/Extender: Claudie Leach Weeks in Treatment: 2 Encounter Discharge Information Items Post  Procedure Vitals Discharge Condition: Stable Temperature (F): 98.5 Ambulatory Status: Wheelchair Pulse (bpm): 81 Discharge Destination: Home Respiratory Rate (breaths/min): 18 Transportation: Private Auto Blood Pressure (mmHg): 149/68 Accompanied By: daughter Schedule Follow-up Appointment: Yes Clinical Summary of Care: Patient Declined Electronic Signature(s) Unsigned Entered ByZenaida Deed on 03/26/2023 15:55:38 Signature(s): Prak, [name] ([id number]) [id number] Date(s): [id number] Page 2 of 19 -------------------------------------------------------------------------------- Lower Extremity Assessment Details Patient Name: Date of Service: Megan, [name] 03/26/2023 1:15 PM Medical Record Number: [id number] Patient Account Number: [id number] Date of Birth/Sex: Treating RN: 1927/12/25 (89 y.o. [name] Primary Care Hampton Cost: [name] Other Clinician: Referring Kloe Oates: Treating Camelle Henkels/Extender: Claudie Leach Weeks in Treatment: 2 Edema Assessment Assessed: [Left: No] [Right: No] Edema: [Left: Yes] [Right: Yes] Calf Left: Right: Point of Measurement: From Medial Instep 42 cm 43 cm Ankle Left: Right: Point of Measurement: From Medial Instep 22 cm 22 cm Vascular Assessment Pulses: Dorsalis Pedis Palpable: [Left:Yes] [Right:Yes] Extremity colors, hair growth, and conditions: Extremity Color: [Left:Hyperpigmented] [Right:Hyperpigmented] Hair Growth on Extremity: [Left:No] [Right:No] Temperature of Extremity: [Left:Warm < 3 seconds] [Right:Warm < 3 seconds] Toe Nail Assessment Left: Right: Thick: Yes Yes Discolored: Yes Yes Deformed: Yes Yes Improper Length and Hygiene: Yes Yes Electronic Signature(s) Unsigned Entered ByZenaida Deed on 03/26/2023 13:52:09 -------------------------------------------------------------------------------- Multi Wound Chart Details Patient Name: Date of Service: Megan, [name] 03/26/2023 1:15 PM Medical Record Number: [id number] Patient Account Number: [id number] Date of Birth/Sex: Treating RN: 1927/10/03 (89 y.o. F) Primary Care Jahid Weida: [name] Other Clinician: Referring Baker Moronta: Treating Aleasha Fregeau/Extender: Claudie Leach Weeks in Treatment: 2 Vital Signs Height(in): 64 Pulse(bpm): 81 Weight(lbs): 157 Blood Pressure(mmHg): 149/68 Body Mass Index(BMI): 26.9 Temperature(F): 98.5 Respiratory Rate(breaths/min): 20 Morrow, Genell M ([id number]) [id number] Page 3 of 19 [10:Photos:] Left T Second oe Right T Third oe Left T Great oe Wound Location: Pressure Injury Pressure Injury Gradually Appeared Wounding Event: Pressure Ulcer Pressure Ulcer Arterial Insufficiency Ulcer Primary Etiology: Cataracts,  Anemia, Arrhythmia, Cataracts, Anemia, Arrhythmia, Cataracts, Anemia, Arrhythmia, Comorbid History: Congestive Heart Failure, Congestive Heart Failure, Congestive Heart Failure, Hypotension, Peripheral Venous Hypotension, Peripheral Venous Hypotension, Peripheral Venous Disease, Osteomyelitis, Neuropathy Disease, Osteomyelitis, Neuropathy Disease, Osteomyelitis, Neuropathy 10/09/2022 10/09/2022 03/17/2023 Date Acquired: 2 2 1  Weeks of Treatment: Open Open Open Wound Status: No No No Wound Recurrence: 0.7x0.9x0.1 0.9x1.1x0.1 1.7x0.9x0.1 Measurements L x W x D (cm) 0.495 0.778 1.202 A (cm) : rea 0.049 0.078 0.12 Volume (cm) : 30.00% -10.00% -142.80% % Reduction in A rea: 31.00% -9.90% -144.90% % Reduction in Volume: Category/Stage IV Category/Stage IV Full Thickness Without Exposed Classification: Support Structures Small Medium Medium Exudate A mount: Serosanguineous Serosanguineous Serous Exudate Type: red, brown red, brown amber Exudate Color: Distinct, outline attached Distinct, outline attached Flat and Intact Wound Margin: Large (67-100%) Medium (34-66%) Small (1-33%) Granulation A  mount: Red Pink Pink Granulation Quality: Small (1-33%) Medium (34-66%) Large (67-100%) Necrotic A mount: Adherent Slough Adherent [employer] Necrotic Tissue: Fat Layer (Subcutaneous Tissue): Yes Fat Layer (Subcutaneous Tissue): Yes Fat Layer (Subcutaneous Tissue): Yes Exposed Structures: Joint: Yes Joint: Yes Fascia: No Bone: Yes Bone: Yes Tendon: No Fascia: No Fascia: No Muscle: No Tendon: No Tendon: No Joint: No Muscle: No Muscle: No Bone: No Small (1-33%) None Small (1-33%) Epithelialization: N/A Debridement - Selective/Open Wound Debridement - Selective/Open Wound Debridement: Pre-procedure Verification/Time Out N/A 14:15 14:15 Taken: N/A Lidocaine 4% Topical Solution Lidocaine 4% Topical Solution Pain Control: N/A Necrotic/Eschar, [employer] Tissue Debrided: N/A Non-Viable Tissue Non-Viable Tissue Level: N/A 0.78 1.2 Debridement A (sq cm): rea N/A Curette Curette Instrument: N/A Minimum Minimum Bleeding: N/A Pressure Pressure Hemostasis A chieved: N/A 4 4 Procedural Pain: N/A 3 3 Post Procedural Pain: N/A Procedure was tolerated well Procedure was tolerated well Debridement Treatment Response: N/A 0.9x1.1x0.1 1.7x0.9x0.1 Post Debridement Measurements L x W x D (cm) N/A 0.078 0.12 Post Debridement Volume: (cm) N/A Category/Stage IV N/A Post Debridement Stage: No Abnormalities Noted No Abnormalities Noted No Abnormalities Noted Periwound Skin Texture: Dry/Scaly: Yes Dry/Scaly: Yes No Abnormalities Noted Periwound Skin Moisture: No Abnormalities Noted Rubor: No Erythema: Yes Periwound Skin Color: N/A N/A Circumferential Erythema Location: No Abnormality No Abnormality No Abnormality Temperature: N/A N/A Yes Tenderness on Palpation: N/A Debridement Debridement Procedures Performed: Wound Number: [id number]  Photos: No Photos Left, Dorsal T Great oe Left, Plantar T Third oe Right, Dorsal T Great oe Wound Location: Not Known  Gradually Appeared Gradually Appeared Wounding Event: T be determined o Vasculitis Arterial Insufficiency Ulcer Primary Etiology: Wirt, Jeanise M ([id number]) [id number] Page 4 of 19 Cataracts, Anemia, Arrhythmia, Cataracts, Anemia, Arrhythmia, Cataracts, Anemia, Arrhythmia, Comorbid History: Congestive Heart Failure, Congestive Heart Failure, Congestive Heart Failure, Hypotension, Peripheral Venous Hypotension, Peripheral Venous Hypotension, Peripheral Venous Disease, Osteomyelitis, Neuropathy Disease, Osteomyelitis, Neuropathy Disease, Osteomyelitis, Neuropathy 03/24/2023 03/24/2023 03/26/2023 Date Acquired: 0 0 0 Weeks of Treatment: Open Open Open Wound Status: No No No Wound Recurrence: 0.2x0.5x0.1 0.4x0.4x0.1 0.3x0.3x0.1 Measurements L x W x D (cm) 0.079 0.126 0.071 A (cm) : rea 0.008 0.013 0.007 Volume (cm) : N/A N/A N/A % Reduction in A rea: N/A N/A N/A % Reduction in Volume: Unclassifiable Full Thickness Without Exposed Full Thickness Without Exposed Classification: Support Structures Support Structures N/A Medium None Present Exudate A mount: N/A Serosanguineous N/A Exudate Type: N/A red, brown N/A Exudate Color: N/A Distinct, outline attached Flat and Intact Wound Margin: N/A Medium (34-66%) Large (67-100%) Granulation A mount: N/A Red Pink Granulation Quality: N/A Medium (34-66%) N/A Necrotic A mount: N/A Eschar  N/A Necrotic Tissue: N/A Fat Layer (Subcutaneous Tissue): Yes Fat Layer (Subcutaneous Tissue): Yes Exposed Structures: Fascia: No Tendon: No Muscle: No Joint: No Bone: No N/A N/A Small (1-33%) Epithelialization: Debridement - Selective/Open Wound Debridement - Selective/Open Wound Debridement - Selective/Open Wound Debridement: Pre-procedure Verification/Time Out 14:15 14:15 14:15 Taken: Lidocaine 4% Topical Solution Lidocaine 4% Topical Solution Lidocaine 4% Topical Solution Pain Control: Proofreader, Ambulance person Tissue Debrided: Non-Viable Tissue Skin/Epidermis Non-Viable Tissue Level: 0.08 0.13 0.07 Debridement A (sq cm): rea Curette Curette Curette Instrument: Minimum Minimum Minimum Bleeding: Pressure Pressure Pressure Hemostasis A chieved: 4 4 4  Procedural Pain: 3 3 3  Post Procedural Pain: Procedure was tolerated well Procedure was tolerated well Procedure was tolerated well Debridement Treatment Response: 0.2x0.5x0.1 0.4x0.4x0.2 0.3x0.3x0.1 Post Debridement Measurements L x W x D (cm) 0.008 0.025 0.007 Post Debridement Volume: (cm) N/A N/A N/A Post Debridement Stage: Scarring: Yes No Abnormalities Noted Periwound Skin Texture: Maceration: No No Abnormalities Noted Periwound Skin Moisture: Dry/Scaly: No Hemosiderin Staining: Yes No Abnormalities Noted Periwound Skin Color: N/A N/A N/A Erythema Location: N/A No Abnormality No Abnormality Temperature: N/A N/A Yes Tenderness on Palpation: Debridement Debridement Debridement Procedures Performed: Wound Number: 9 N/A N/A Photos: N/A N/A Left Calcaneus N/A N/A Wound Location: Pressure Injury N/A N/A Wounding Event: Pressure Ulcer N/A N/A Primary Etiology: Cataracts, Anemia, Arrhythmia, N/A N/A Comorbid History: Congestive Heart Failure, Hypotension, Peripheral Venous Disease, Osteomyelitis, Neuropathy 10/22/2022 N/A N/A Date Acquired: 2 N/A N/A Weeks of Treatment: Open N/A N/A Wound Status: No N/A N/A Wound Recurrence: 0.9x0.8x0.4 N/A N/A Measurements L x W x D (cm) 0.565 N/A N/A A (cm) : rea 0.226 N/A N/A Volume (cm) : -71.20% N/A N/A % Reduction in A rea: -128.30% N/A N/A % Reduction in Volume: Category/Stage II N/A N/A Classification: Medium N/A N/A Exudate A mountHILA, BOLDING ([id number]) [id number] Page 5 of 19 Serosanguineous N/A N/A Exudate Type: red, brown N/A N/A Exudate Color: Distinct, outline attached N/A N/A Wound  Margin: Small (1-33%) N/A N/A Granulation Amount: Red N/A N/A Granulation Quality: Large (67-100%) N/A N/A Necrotic Amount: Adherent Slough N/A N/A Necrotic Tissue: Fat Layer (Subcutaneous Tissue): Yes N/A N/A Exposed Structures: Fascia: No Tendon: No Muscle: No Joint: No Bone: No None N/A N/A Epithelialization: Debridement - Selective/Open Wound N/A N/A Debridement: Pre-procedure Verification/Time Out 14:15 N/A N/A Taken: Lidocaine 4% Topical Solution N/A N/A Pain Control: Slough N/A N/A Tissue Debrided: Non-Viable Tissue N/A N/A Level: 0.57 N/A N/A Debridement A (sq cm): rea Curette N/A N/A Instrument: Minimum N/A N/A Bleeding: Pressure N/A N/A Hemostasis A chieved: 4 N/A N/A Procedural Pain: 3 N/A N/A Post Procedural Pain: Procedure was tolerated well N/A N/A Debridement Treatment Response: 0.9x0.8x0.4 N/A N/A Post Debridement Measurements L x W x D (cm) 0.226 N/A N/A Post Debridement Volume: (cm) Category/Stage II N/A N/A Post Debridement Stage: Callus: No N/A N/A Periwound Skin Texture: Dry/Scaly: Yes N/A N/A Periwound Skin Moisture: No Abnormalities Noted N/A N/A Periwound Skin Color: N/A N/A N/A Erythema Location: No Abnormality N/A N/A Temperature: Yes N/A N/A Tenderness on Palpation: Debridement N/A N/A Procedures Performed: Treatment Notes Wound #10 (Toe Second) Wound Laterality: Left Cleanser Soap and Water Discharge Instruction: May shower and wash wound with dial antibacterial soap and water prior to dressing change. Wound Cleanser Discharge Instruction: Cleanse the wound with wound cleanser prior to applying a clean dressing using gauze sponges, not tissue or cotton balls. Peri-Wound Care Topical Primary Dressing Promogran Prisma Matrix, 4.34 (sq in) (silver collagen) Discharge Instruction: Moisten collagen  with saline or hydrogel Secondary Dressing Woven Gauze Sponge, Non-Sterile 4x4 in Discharge Instruction: Apply over  primary dressing as directed. Secured With 74M Medipore H Soft Cloth Surgical T ape, 4 x 10 (in/yd) Discharge Instruction: Secure with tape as directed. Compression Wrap Compression Stockings Add-Ons Wound #11 (Toe Third) Wound Laterality: Right Cleanser Soap and Water Discharge Instruction: May shower and wash wound with dial antibacterial soap and water prior to dressing change. Wound Cleanser Discharge Instruction: Cleanse the wound with wound cleanser prior to applying a clean dressing using gauze sponges, not tissue or cotton balls. RIHANNA, MARSEILLE ([id number]) [id number] Page 6 of 19 Peri-Wound Care Topical Primary Dressing Promogran Prisma Matrix, 4.34 (sq in) (silver collagen) Discharge Instruction: Moisten collagen with saline or hydrogel Secondary Dressing Woven Gauze Sponge, Non-Sterile 4x4 in Discharge Instruction: Apply over primary dressing as directed. Secured With 74M Medipore H Soft Cloth Surgical T ape, 4 x 10 (in/yd) Discharge Instruction: Secure with tape as directed. Compression Wrap Compression Stockings Add-Ons Wound #12 (Toe Great) Wound Laterality: Left Cleanser Soap and Water Discharge Instruction: May shower and wash wound with dial antibacterial soap and water prior to dressing change. Wound Cleanser Discharge Instruction: Cleanse the wound with wound cleanser prior to applying a clean dressing using gauze sponges, not tissue or cotton balls. Peri-Wound Care Topical Primary Dressing Promogran Prisma Matrix, 4.34 (sq in) (silver collagen) Discharge Instruction: Moisten collagen with saline or hydrogel Secondary Dressing Woven Gauze Sponge, Non-Sterile 4x4 in Discharge Instruction: Apply over primary dressing as directed. Secured With 74M Medipore H Soft Cloth Surgical T ape, 4 x 10 (in/yd) Discharge Instruction: Secure with tape as directed. Compression Wrap Compression Stockings Add-Ons Wound #13 (Toe Great) Wound  Laterality: Dorsal, Left Cleanser Soap and Water Discharge Instruction: May shower and wash wound with dial antibacterial soap and water prior to dressing change. Wound Cleanser Discharge Instruction: Cleanse the wound with wound cleanser prior to applying a clean dressing using gauze sponges, not tissue or cotton balls. Peri-Wound Care Topical Primary Dressing Promogran Prisma Matrix, 4.34 (sq in) (silver collagen) Discharge Instruction: Moisten collagen with saline or hydrogel Secondary Dressing Woven Gauze Sponge, Non-Sterile 4x4 in Discharge Instruction: Apply over primary dressing as directed. Secured With 74M Medipore H Soft Cloth Surgical T ape, 4 x 10 (in/yd) Discharge Instruction: Secure with tape as directed. CHINYERE, GALIANO ([id number]) [id number] Page 7 of 19 Compression Wrap Compression Stockings Add-Ons Wound #14 (Toe Third) Wound Laterality: Left Cleanser Soap and Water Discharge Instruction: May shower and wash wound with dial antibacterial soap and water prior to dressing change. Wound Cleanser Discharge Instruction: Cleanse the wound with wound cleanser prior to applying a clean dressing using gauze sponges, not tissue or cotton balls. Peri-Wound Care Topical Primary Dressing Promogran Prisma Matrix, 4.34 (sq in) (silver collagen) Discharge Instruction: Moisten collagen with saline or hydrogel Secondary Dressing Woven Gauze Sponge, Non-Sterile 4x4 in Discharge Instruction: Apply over primary dressing as directed. Secured With 74M Medipore H Soft Cloth Surgical T ape, 4 x 10 (in/yd) Discharge Instruction: Secure with tape as directed. Compression Wrap Compression Stockings Add-Ons Wound #15 (Toe Great) Wound Laterality: Dorsal, Right Cleanser Soap and Water Discharge Instruction: May shower and wash wound with dial antibacterial soap and water prior to dressing change. Wound Cleanser Discharge Instruction: Cleanse the wound with  wound cleanser prior to applying a clean dressing using gauze sponges, not tissue or cotton balls. Peri-Wound Care Topical Primary Dressing Promogran Prisma Matrix, 4.34 (sq in) (silver collagen) Discharge Instruction: Moisten collagen with saline  or hydrogel Secondary Dressing Woven Gauze Sponge, Non-Sterile 4x4 in Discharge Instruction: Apply over primary dressing as directed. Secured With 61M Medipore H Soft Cloth Surgical T ape, 4 x 10 (in/yd) Discharge Instruction: Secure with tape as directed. Compression Wrap Compression Stockings Add-Ons Wound #9 (Calcaneus) Wound Laterality: Left Cleanser Soap and Water Discharge Instruction: May shower and wash wound with dial antibacterial soap and water prior to dressing change. Wound Cleanser Discharge Instruction: Cleanse the wound with wound cleanser prior to applying a clean dressing using gauze sponges, not tissue or cotton balls. Peri-Wound Care DANE, BLOCH ([id number]) [id number] Page 8 of 19 Topical Primary Dressing Promogran Prisma Matrix, 4.34 (sq in) (silver collagen) Discharge Instruction: Moisten collagen with saline or hydrogel Secondary Dressing ALLEVYN Heel 4 1/2in x 5 1/2in / 10.5cm x 13.5cm Discharge Instruction: Apply over primary dressing as directed. Woven Gauze Sponge, Non-Sterile 4x4 in Discharge Instruction: Apply over primary dressing as directed. Secured With 61M Medipore H Soft Cloth Surgical T ape, 4 x 10 (in/yd) Discharge Instruction: Secure with tape as directed. Compression Wrap Compression Stockings Add-Ons Electronic Signature(s) Signed: 03/26/2023 4:10:55 PM By: Duanne Guess MD FACS Entered By: Duanne Guess on 03/26/2023 16:10:55 -------------------------------------------------------------------------------- Multi-Disciplinary Care Plan Details Patient Name: Date of Service: [name]. 03/26/2023 1:15 PM Medical Record Number: [id number] Patient Account  Number: [id number] Date of Birth/Sex: Treating RN: 1928/08/14 (89 y.o. [name] Primary Care Lazara Grieser: [name] Other Clinician: Referring Marshia Tropea: Treating Jalin Alicea/Extender: Tiajuana Amass in Treatment: 2 Multidisciplinary Care Plan reviewed with physician Active Inactive Abuse / Safety / Falls / Self Care Management Nursing Diagnoses: Impaired home maintenance Impaired physical mobility Potential for falls Goals: Patient/caregiver will verbalize/demonstrate measure taken to improve self care Date Initiated: 03/10/2023 Target Resolution Date: 05/15/2023 Goal Status: Active Patient/caregiver will verbalize/demonstrate measures taken to improve the patient's personal safety Date Initiated: 03/10/2023 Target Resolution Date: 05/15/2023 Goal Status: Active Interventions: Assess fall risk on admission and as needed Assess: immobility, friction, shearing, incontinence upon admission and as needed Provide education on fall prevention Notes: Wound/Skin Impairment Nursing Diagnoses: Impaired tissue integrity Kolek, [name] ([id number]) [id number] Page 9 of 19 Knowledge deficit related to ulceration/compromised skin integrity Goals: Patient/caregiver will verbalize understanding of skin care regimen Date Initiated: 03/10/2023 Target Resolution Date: 05/15/2023 Goal Status: Active Interventions: Assess patient/caregiver ability to obtain necessary supplies Assess patient/caregiver ability to perform ulcer/skin care regimen upon admission and as needed Assess ulceration(s) every visit Treatment Activities: Skin care regimen initiated : 03/10/2023 Topical wound management initiated : 03/10/2023 Notes: Electronic Signature(s) Unsigned Entered By: Zenaida Deed on 03/26/2023 14:02:44 -------------------------------------------------------------------------------- Pain Assessment Details Patient Name: Date of Service: SPIRIT, WERNLI 03/26/2023 1:15 PM Medical Record Number: [id number] Patient Account Number: [id number] Date of Birth/Sex: Treating RN: [birth year] (89 y.o. F) Primary Care Nikholas Geffre: [name] Other Clinician: Referring Javante Nilsson: Treating Gunner Iodice/Extender: Claudie Leach Weeks in Treatment: 2 Active Problems Location of Pain Severity and Description of Pain Patient Has Paino Yes Site Locations Rate the pain. Current Pain Level: 3 Worst Pain Level: 10 Least Pain Level: 0 Tolerable Pain Level: 4 Pain Management and Medication Current Pain Management: Electronic Signature(s) Signed: 03/26/2023 1:47:42 PM By: Dayton Scrape Entered By: Dayton Scrape on 03/26/2023 13:09:35 Malenfant, [name] ([id number]) [id number] Page 10 of 19 -------------------------------------------------------------------------------- Patient/Caregiver Education Details Patient Name: Date of Service: IMUNIQUE, SAMAD 7/18/2024andnbsp1:15 PM Medical Record Number: [id number] Patient Account Number: [id number] Date of Birth/Gender: Treating RN: [birth year] (89 y.o. F) Zenaida Deed  Primary Care Physician: [name] Other Clinician: Referring Physician: Treating Physician/Extender: Tiajuana Amass in Treatment: 2 Education Assessment Education Provided To: Patient and Caregiver Education Topics Provided Wound/Skin Impairment: Methods: Demonstration Responses: State content correctly Electronic Signature(s) Unsigned Entered By: Zenaida Deed on 03/26/2023 14:03:09 -------------------------------------------------------------------------------- Wound Assessment Details Patient Name: Date of Service: LIDIYA, REISE 03/26/2023 1:15 PM Medical Record Number: [id number] Patient Account Number: [id number] Date of Birth/Sex: Treating RN: Mar 15, 1928 (89 y.o. F) Primary Care Hoang Reich: [name] Other Clinician: Referring Fany Cavanaugh: Treating Marleah Beever/Extender:  Claudie Leach Weeks in Treatment: 2 Wound Status Wound Number: 10 Primary Pressure Ulcer Etiology: Wound Location: Left T Second oe Wound Open Wounding Event: Pressure Injury Status: Date Acquired: 10/09/2022 Comorbid Cataracts, Anemia, Arrhythmia, Congestive Heart Failure, Weeks Of Treatment: 2 History: Hypotension, Peripheral Venous Disease, Osteomyelitis, Clustered Wound: No Neuropathy Photos Wound Measurements Fricke, Ramina M ([id number]) Length: (cm) 0.7 Width: (cm) 0.9 Depth: (cm) 0.1 Area: (cm) 0.495 Volume: (cm) 0.049 [id number] Page 11 of 19 % Reduction in Area: 30% % Reduction in Volume: 31% Epithelialization: Small (1-33%) Tunneling: No Undermining: No Wound Description Classification: Category/Stage IV Wound Margin: Distinct, outline attached Exudate Amount: Small Exudate Type: Serosanguineous Exudate Color: red, brown Foul Odor After Cleansing: No Slough/Fibrino No Wound Bed Granulation Amount: Large (67-100%) Exposed Structure Granulation Quality: Red Fascia Exposed: No Necrotic Amount: Small (1-33%) Fat Layer (Subcutaneous Tissue) Exposed: Yes Necrotic Quality: Adherent Slough Tendon Exposed: No Muscle Exposed: No Joint Exposed: Yes Bone Exposed: Yes Periwound Skin Texture Texture Color No Abnormalities Noted: Yes No Abnormalities Noted: Yes Moisture Temperature / Pain No Abnormalities Noted: Yes Temperature: No Abnormality Treatment Notes Wound #10 (Toe Second) Wound Laterality: Left Cleanser Soap and Water Discharge Instruction: May shower and wash wound with dial antibacterial soap and water prior to dressing change. Wound Cleanser Discharge Instruction: Cleanse the wound with wound cleanser prior to applying a clean dressing using gauze sponges, not tissue or cotton balls. Peri-Wound Care Topical Primary Dressing Promogran Prisma Matrix, 4.34 (sq in) (silver collagen) Discharge Instruction:  Moisten collagen with saline or hydrogel Secondary Dressing Woven Gauze Sponge, Non-Sterile 4x4 in Discharge Instruction: Apply over primary dressing as directed. Secured With 43M Medipore H Soft Cloth Surgical T ape, 4 x 10 (in/yd) Discharge Instruction: Secure with tape as directed. Compression Wrap Compression Stockings Add-Ons Electronic Signature(s) Unsigned Previous Signature: 03/26/2023 1:47:42 PM Version By: Dayton Scrape Entered By: Zenaida Deed on 03/26/2023 13:57:44 Signature(s): Dower, [name] ([id number]) [id number] Date(s): [id number].pdf Page 12 of 19 -------------------------------------------------------------------------------- Wound Assessment Details Patient Name: Date of Service: EMMILIA, SOWDER 03/26/2023 1:15 PM Medical Record Number: [id number] Patient Account Number: [id number] Date of Birth/Sex: Treating RN: 1928-03-22 (89 y.o. F) Primary Care Monty Mccarrell: [name] Other Clinician: Referring Mikah Rottinghaus: Treating Lisia Westbay/Extender: Claudie Leach Weeks in Treatment: 2 Wound Status Wound Number: 11 Primary Pressure Ulcer Etiology: Wound Location: Right T Third oe Wound Open Wounding Event: Pressure Injury Status: Date Acquired: 10/09/2022 Comorbid Cataracts, Anemia, Arrhythmia, Congestive Heart Failure, Weeks Of Treatment: 2 History: Hypotension, Peripheral Venous Disease, Osteomyelitis, Clustered Wound: No Neuropathy Photos Wound Measurements Length: (cm) 0.9 Width: (cm) 1.1 Depth: (cm) 0.1 Area: (cm) 0.778 Volume: (cm) 0.078 % Reduction in Area: -10% % Reduction in Volume: -9.9% Epithelialization: None Wound Description Classification: Category/Stage IV Wound Margin: Distinct, outline attached Exudate Amount: Medium Exudate Type: Serosanguineous Exudate Color: red, brown Foul Odor After Cleansing: No Slough/Fibrino Yes Wound Bed Granulation Amount: Medium (34-66%) Exposed Structure Granulation Quality:  Pink Fascia Exposed:  No Necrotic Amount: Medium (34-66%) Fat Layer (Subcutaneous Tissue) Exposed: Yes Necrotic Quality: Adherent Slough Tendon Exposed: No Muscle Exposed: No Joint Exposed: Yes Bone Exposed: Yes Periwound Skin Texture Texture Color No Abnormalities Noted: Yes No Abnormalities Noted: Yes Moisture Temperature / Pain No Abnormalities Noted: Yes Temperature: No Abnormality Treatment Notes Wound #11 (Toe Third) Wound Laterality: Right Cleanser Soap and Water Discharge Instruction: May shower and wash wound with dial antibacterial soap and water prior to dressing change. Wound Cleanser Discharge Instruction: Cleanse the wound with wound cleanser prior to applying a clean dressing using gauze sponges, not tissue or cotton balls. Peri-Wound Care ANELIESE, BEAUDRY ([id number]) [id number] Page 13 of 19 Topical Primary Dressing Promogran Prisma Matrix, 4.34 (sq in) (silver collagen) Discharge Instruction: Moisten collagen with saline or hydrogel Secondary Dressing Woven Gauze Sponge, Non-Sterile 4x4 in Discharge Instruction: Apply over primary dressing as directed. Secured With 34M Medipore H Soft Cloth Surgical T ape, 4 x 10 (in/yd) Discharge Instruction: Secure with tape as directed. Compression Wrap Compression Stockings Add-Ons Electronic Signature(s) Signed: 03/26/2023 1:47:42 PM By: Dayton Scrape Entered By: Dayton Scrape on 03/26/2023 13:29:48 -------------------------------------------------------------------------------- Wound Assessment Details Patient Name: Date of Service: SHIQUITA, COLLIGNON 03/26/2023 1:15 PM Medical Record Number: [id number] Patient Account Number: [id number] Date of Birth/Sex: Treating RN: 01-30-1928 (89 y.o. F) Primary Care Khing Belcher: [name] Other Clinician: Referring Cheray Pardi: Treating Rennae Ferraiolo/Extender: Claudie Leach Weeks in Treatment: 2 Wound Status Wound Number: 12 Primary Arterial  Insufficiency Ulcer Etiology: Wound Location: Left T Great oe Wound Open Wounding Event: Gradually Appeared Status: Date Acquired: 03/17/2023 Comorbid Cataracts, Anemia, Arrhythmia, Congestive Heart Failure, Weeks Of Treatment: 1 History: Hypotension, Peripheral Venous Disease, Osteomyelitis, Clustered Wound: No Neuropathy Photos Wound Measurements Length: (cm) 1.7 Width: (cm) 0.9 Depth: (cm) 0.1 Area: (cm) 1.202 Volume: (cm) 0.12 % Reduction in Area: -142.8% % Reduction in Volume: -144.9% Epithelialization: Small (1-33%) Tunneling: No Undermining: No Wound Description Classification: Full Thickness Without Exposed Support Structures Wound Margin: Flat and Intact Exudate Amount: Medium Domine, Reine M ([id number]) Exudate Type: Serous Exudate Color: amber Foul Odor After Cleansing: No Slough/Fibrino Yes [id number].pdf Page 14 of 19 Wound Bed Granulation Amount: Small (1-33%) Exposed Structure Granulation Quality: Pink Fascia Exposed: No Necrotic Amount: Large (67-100%) Fat Layer (Subcutaneous Tissue) Exposed: Yes Necrotic Quality: Adherent Slough Tendon Exposed: No Muscle Exposed: No Joint Exposed: No Bone Exposed: No Periwound Skin Texture Texture Color No Abnormalities Noted: Yes No Abnormalities Noted: No Erythema: Yes Moisture Erythema Location: Circumferential No Abnormalities Noted: Yes Temperature / Pain Temperature: No Abnormality Tenderness on Palpation: Yes Treatment Notes Wound #12 (Toe Great) Wound Laterality: Left Cleanser Soap and Water Discharge Instruction: May shower and wash wound with dial antibacterial soap and water prior to dressing change. Wound Cleanser Discharge Instruction: Cleanse the wound with wound cleanser prior to applying a clean dressing using gauze sponges, not tissue or cotton balls. Peri-Wound Care Topical Primary Dressing Promogran Prisma Matrix, 4.34 (sq in) (silver collagen) Discharge  Instruction: Moisten collagen with saline or hydrogel Secondary Dressing Woven Gauze Sponge, Non-Sterile 4x4 in Discharge Instruction: Apply over primary dressing as directed. Secured With 34M Medipore H Soft Cloth Surgical T ape, 4 x 10 (in/yd) Discharge Instruction: Secure with tape as directed. Compression Wrap Compression Stockings Add-Ons Electronic Signature(s) Unsigned Previous Signature: 03/26/2023 1:47:42 PM Version By: Dayton Scrape Entered By: Zenaida Deed on 03/26/2023 13:58:05 -------------------------------------------------------------------------------- Wound Assessment Details Patient Name: Date of Service: DALAYSIA, HARMS 03/26/2023 1:15 PM Medical Record Number: [id number] Patient Account Number:  [id number] Date of Birth/Sex: Treating RN: [birth year] (89 y.o. F) Primary Care Sheilah [name]: [name] Other Clinician: Referring Sharica Roedel: Treating Kamari Buch/Extender: Claudie Leach Weeks in Treatment: 2 Glick, [name] ([id number]) [id number] Page 15 of 19 Wound Status Wound Number: 13 Primary T be determined o Etiology: Wound Location: Left, Dorsal T Great oe Wound Open Wounding Event: Not Known Status: Date Acquired: 03/24/2023 Comorbid Cataracts, Anemia, Arrhythmia, Congestive Heart Failure, Weeks Of Treatment: 0 History: Hypotension, Peripheral Venous Disease, Osteomyelitis, Clustered Wound: No Neuropathy Photos Wound Measurements Length: (cm) 0.2 Width: (cm) 0.5 Depth: (cm) 0.1 Area: (cm) 0.079 Volume: (cm) 0.008 % Reduction in Area: % Reduction in Volume: Tunneling: No Undermining: No Wound Description Classification: Unclassifiable Periwound Skin Texture Texture Color No Abnormalities Noted: No No Abnormalities Noted: No Moisture No Abnormalities Noted: No Electronic Signature(s) Unsigned Previous Signature: 03/26/2023 1:47:42 PM Version By: Dayton Scrape Entered By: Zenaida Deed on 03/26/2023  13:58:42 -------------------------------------------------------------------------------- Wound Assessment Details Patient Name: Date of Service: BITHA, FAUTEUX 03/26/2023 1:15 PM Medical Record Number: [id number] Patient Account Number: [id number] Date of Birth/Sex: Treating RN: 06/02/1928 (89 y.o. [name] Primary Care Joah Patlan: [name] Other Clinician: Referring Ranald Alessio: Treating Rylan Bernard/Extender: Claudie Leach Weeks in Treatment: 2 Wound Status Wound Number: 14 Primary Vasculitis Etiology: Wound Location: Left, Plantar T Third oe Wound Open Wounding Event: Gradually Appeared Status: Date Acquired: 03/24/2023 Comorbid Cataracts, Anemia, Arrhythmia, Congestive Heart Failure, Weeks Of Treatment: 0 History: Hypotension, Peripheral Venous Disease, Osteomyelitis, Clustered Wound: No Neuropathy Photos Romain, [name] ([id number]) [id number] Page 16 of 19 Wound Measurements Length: (cm) 0.4 Width: (cm) 0.4 Depth: (cm) 0.1 Area: (cm) 0.126 Volume: (cm) 0.013 % Reduction in Area: % Reduction in Volume: Tunneling: No Undermining: No Wound Description Classification: Full Thickness Without Exposed Suppo Wound Margin: Distinct, outline attached Exudate Amount: Medium Exudate Type: Serosanguineous Exudate Color: red, brown rt Structures Foul Odor After Cleansing: No Slough/Fibrino No Wound Bed Granulation Amount: Medium (34-66%) Exposed Structure Granulation Quality: Red Fat Layer (Subcutaneous Tissue) Exposed: Yes Necrotic Amount: Medium (34-66%) Necrotic Quality: Eschar Periwound Skin Texture Texture Color No Abnormalities Noted: No No Abnormalities Noted: No Scarring: Yes Hemosiderin Staining: Yes Moisture Temperature / Pain No Abnormalities Noted: No Temperature: No Abnormality Dry / Scaly: No Maceration: No Electronic Signature(s) Unsigned Entered By: Brenton Grills on 03/26/2023  14:01:22 -------------------------------------------------------------------------------- Wound Assessment Details Patient Name: Date of Service: JESSLYNN, KRUCK 03/26/2023 1:15 PM Medical Record Number: [id number] Patient Account Number: [id number] Date of Birth/Sex: Treating RN: Dec 19, 1927 (89 y.o. [name] Primary Care Muadh Creasy: [name] Other Clinician: Referring Tahirih Lair: Treating Lashawn Orrego/Extender: Claudie Leach Weeks in Treatment: 2 Wound Status Wound Number: 15 Primary Arterial Insufficiency Ulcer Etiology: Wound Location: Right, Dorsal T Great oe Wound Open Wounding Event: Gradually Appeared Status: Date Acquired: 03/26/2023 Comorbid Cataracts, Anemia, Arrhythmia, Congestive Heart Failure, Weeks Of Treatment: 0 History: Hypotension, Peripheral Venous Disease, Osteomyelitis, Clustered Wound: No Neuropathy Gago, Parlee M ([id number]) [id number] Page 17 of 19 Wound Measurements Length: (cm) 0.3 Width: (cm) 0.3 Depth: (cm) 0.1 Area: (cm) 0.071 Volume: (cm) 0.007 % Reduction in Area: % Reduction in Volume: Epithelialization: Small (1-33%) Tunneling: No Undermining: No Wound Description Classification: Full Thickness Without Exposed Support Structures Wound Margin: Flat and Intact Exudate Amount: None Present Foul Odor After Cleansing: No Slough/Fibrino No Wound Bed Granulation Amount: Large (67-100%) Exposed Structure Granulation Quality: Pink Fascia Exposed: No Fat Layer (Subcutaneous Tissue) Exposed: Yes Tendon Exposed: No Muscle Exposed: No Joint Exposed: No Bone Exposed: No  Periwound Skin Texture Texture Color No Abnormalities Noted: Yes No Abnormalities Noted: Yes Moisture Temperature / Pain No Abnormalities Noted: Yes Temperature: No Abnormality Tenderness on Palpation: Yes Treatment Notes Wound #15 (Toe Great) Wound Laterality: Dorsal, Right Cleanser Soap and Water Discharge Instruction:  May shower and wash wound with dial antibacterial soap and water prior to dressing change. Wound Cleanser Discharge Instruction: Cleanse the wound with wound cleanser prior to applying a clean dressing using gauze sponges, not tissue or cotton balls. Peri-Wound Care Topical Primary Dressing Promogran Prisma Matrix, 4.34 (sq in) (silver collagen) Discharge Instruction: Moisten collagen with saline or hydrogel Secondary Dressing Woven Gauze Sponge, Non-Sterile 4x4 in Discharge Instruction: Apply over primary dressing as directed. Secured With 62M Medipore H Soft Cloth Surgical T ape, 4 x 10 (in/yd) Discharge Instruction: Secure with tape as directed. Compression Wrap Compression Stockings Add-Ons Electronic Signature(s) Unsigned Entered By: Zenaida Deed on 03/26/2023 14:31:53 Signature(s): Donaghey, [name] ([id number]) [phone] Date(s): [id number].pdf Page 18 of 19 -------------------------------------------------------------------------------- Wound Assessment Details Patient Name: Date of Service: LORAE, ROIG 03/26/2023 1:15 PM Medical Record Number: [id number] Patient Account Number: [id number] Date of Birth/Sex: Treating RN: [birth year] (89 y.o. F) Primary Care Islah Eve: [name] Other Clinician: Referring Lorrie Strauch: Treating Johnrobert Foti/Extender: Claudie Leach Weeks in Treatment: 2 Wound Status Wound Number: 9 Primary Pressure Ulcer Etiology: Wound Location: Left Calcaneus Wound Open Wounding Event: Pressure Injury Status: Date Acquired: 10/22/2022 Comorbid Cataracts, Anemia, Arrhythmia, Congestive Heart Failure, Weeks Of Treatment: 2 History: Hypotension, Peripheral Venous Disease, Osteomyelitis, Clustered Wound: No Neuropathy Photos Wound Measurements Length: (cm) 0.9 Width: (cm) 0.8 Depth: (cm) 0.4 Area: (cm) 0.565 Volume: (cm) 0.226 % Reduction in Area: -71.2% % Reduction in Volume: -128.3% Epithelialization:  None Tunneling: No Undermining: No Wound Description Classification: Category/Stage II Wound Margin: Distinct, outline attached Exudate Amount: Medium Exudate Type: Serosanguineous Exudate Color: red, brown Foul Odor After Cleansing: No Slough/Fibrino Yes Wound Bed Granulation Amount: Small (1-33%) Exposed Structure Granulation Quality: Red Fascia Exposed: No Necrotic Amount: Large (67-100%) Fat Layer (Subcutaneous Tissue) Exposed: Yes Necrotic Quality: Adherent Slough Tendon Exposed: No Muscle Exposed: No Joint Exposed: No Bone Exposed: No Periwound Skin Texture Texture Color No Abnormalities Noted: Yes No Abnormalities Noted: Yes Moisture Temperature / Pain No Abnormalities Noted: No Temperature: No Abnormality Dry / Scaly: Yes Tenderness on Palpation: Yes Treatment Notes Wound #9 (Calcaneus) Wound Laterality: Left Cleanser Soap and Water Discharge Instruction: May shower and wash wound with dial antibacterial soap and water prior to dressing change. Wound Cleanser Discharge Instruction: Cleanse the wound with wound cleanser prior to applying a clean dressing using gauze sponges, not tissue or cotton balls. Peri-Wound Care AUDRIELLE, VANKUREN ([id number]) [id number] Page 19 of 19 Topical Primary Dressing Promogran Prisma Matrix, 4.34 (sq in) (silver collagen) Discharge Instruction: Moisten collagen with saline or hydrogel Secondary Dressing ALLEVYN Heel 4 1/2in x 5 1/2in / 10.5cm x 13.5cm Discharge Instruction: Apply over primary dressing as directed. Woven Gauze Sponge, Non-Sterile 4x4 in Discharge Instruction: Apply over primary dressing as directed. Secured With 62M Medipore H Soft Cloth Surgical T ape, 4 x 10 (in/yd) Discharge Instruction: Secure with tape as directed. Compression Wrap Compression Stockings Add-Ons Electronic Signature(s) Unsigned Previous Signature: 03/26/2023 1:47:42 PM Version By: Dayton Scrape Entered By: Zenaida Deed on 03/26/2023 13:57:21 -------------------------------------------------------------------------------- Vitals Details Patient Name: Date of Service: [name]. 03/26/2023 1:15 PM Medical Record Number: [id number] Patient Account Number: [id number] Date of Birth/Sex: Treating RN: 1927-12-29 (89 y.o. F) Primary Care Joeph Szatkowski: [name]  Other Clinician: Referring Shayann Garbutt: Treating Bettyjean Stefanski/Extender: Merceda Elks, Lester [place] in Treatment: 2 Vital Signs Time Taken: 01:08 Temperature (F): 98.5 Height (in): 64 Pulse (bpm): 81 Weight (lbs): 157 Respiratory Rate (breaths/min): 20 Body Mass Index (BMI): 26.9 Blood Pressure (mmHg): 149/68 Reference Range: 80 - 120 mg / dl Electronic Signature(s) Signed: 03/26/2023 1:47:42 PM By: Dayton Scrape Entered By: Dayton Scrape on 03/26/2023 13:09:21

## 2023-03-28 NOTE — Progress Notes (Signed)
[name], [name] ([id number]) [id number] Page 1 of 18 Visit Report for 03/26/2023 Chief Complaint Document Details Patient Name: Date of Service: [name], [name]. 03/26/2023 1:15 PM Medical Record Number: [id number] Patient Account Number: [id number] Date of Birth/Sex: Treating RN: 08/01/1928 (89 y.o. F) Primary Care Provider: [name] Other Clinician: Referring Provider: Treating Provider/Extender: Claudie Leach Weeks in Treatment: 2 Information Obtained from: Patient Chief Complaint 01/28/2022; right second toe amputation site dehiscence and bilateral lower extremity wounds. 03/10/2023: pressure ulcers of right heel, ulcers on toes Electronic Signature(s) Signed: 03/26/2023 4:11:06 PM By: Duanne Guess MD FACS Entered By: Duanne Guess on 03/26/2023 16:11:06 -------------------------------------------------------------------------------- Debridement Details Patient Name: Date of Service: [name]. 03/26/2023 1:15 PM Medical Record Number: [id number] Patient Account Number: [id number] Date of Birth/Sex: Treating RN: Mar 26, 1928 (89 y.o. [name] Primary Care Provider: [name] Other Clinician: Referring Provider: Treating Provider/Extender: Claudie Leach Weeks in Treatment: 2 Debridement Performed for Assessment: Wound #11 Right T Third oe Performed By: Physician Duanne Guess, MD Debridement Type: Debridement Level of Consciousness (Pre-procedure): Awake and Alert Pre-procedure Verification/Time Out Yes - 14:15 Taken: Start Time: 14:16 Pain Control: Lidocaine 4% Topical Solution Percent of Wound Bed Debrided: 100% T Area Debrided (cm): otal 0.78 Tissue and other material debrided: Non-Viable, Eschar, Slough, Slough Level: Non-Viable Tissue Debridement Description: Selective/Open Wound Instrument: Curette Bleeding: Minimum Hemostasis Achieved: Pressure Procedural Pain: 4 Post  Procedural Pain: 3 Response to Treatment: Procedure was tolerated well Level of Consciousness (Post- Awake and Alert procedure): Post Debridement Measurements of Total Wound Length: (cm) 0.9 Stage: Category/Stage IV Width: (cm) 1.1 Depth: (cm) 0.1 Volume: (cm) 0.078 Character of Wound/Ulcer Post Debridement: Stable [name], [name] ([id number]) [id number] Page 2 of 18 Post Procedure Diagnosis Same as Pre-procedure Notes scribed for Dr. Lady Gary by Zenaida Deed, RN Electronic Signature(s) Signed: 03/26/2023 4:23:46 PM By: Duanne Guess MD FACS Signed: 03/26/2023 4:59:36 PM By: Zenaida Deed RN, BSN Entered By: Zenaida Deed on 03/26/2023 14:18:17 -------------------------------------------------------------------------------- Debridement Details Patient Name: Date of Service: [name]. 03/26/2023 1:15 PM Medical Record Number: [id number] Patient Account Number: [id number] Date of Birth/Sex: Treating RN: 10-01-1927 (89 y.o. [name] Primary Care Provider: [name] Other Clinician: Referring Provider: Treating Provider/Extender: Claudie Leach Weeks in Treatment: 2 Debridement Performed for Assessment: Wound #14 Left T Third oe Performed By: Physician Duanne Guess, MD Debridement Type: Debridement Severity of Tissue Pre Debridement: Fat layer exposed Level of Consciousness (Pre-procedure): Awake and Alert Pre-procedure Verification/Time Out Yes - 14:15 Taken: Start Time: 14:16 Pain Control: Lidocaine 4% T opical Solution Percent of Wound Bed Debrided: 100% T Area Debrided (cm): otal 0.13 Tissue and other material debrided: Non-Viable, Eschar, Slough, Skin: Epidermis, Slough Level: Skin/Epidermis Debridement Description: Selective/Open Wound Instrument: Curette Specimen: Tissue Culture Number of Specimens T aken: 1 Bleeding: Minimum Hemostasis Achieved: Pressure Procedural Pain: 4 Post Procedural  Pain: 3 Response to Treatment: Procedure was tolerated well Level of Consciousness (Post- Awake and Alert procedure): Post Debridement Measurements of Total Wound Length: (cm) 0.4 Width: (cm) 0.4 Depth: (cm) 0.2 Volume: (cm) 0.025 Character of Wound/Ulcer Post Debridement: Requires Further Debridement Severity of Tissue Post Debridement: Bone involvement without necrosis Post Procedure Diagnosis Same as Pre-procedure Notes scribed for Dr. Lady Gary by Zenaida Deed, RN Electronic Signature(s) Signed: 03/26/2023 4:23:46 PM By: Duanne Guess MD FACS Signed: 03/26/2023 4:59:36 PM By: Zenaida Deed RN, BSN Entered By: Zenaida Deed on 03/26/2023 14:26:46 [name], [name] ([id number]) [id number] Page 3 of 18 --------------------------------------------------------------------------------  Debridement Details Patient Name: Date of Service: [name], [name] 03/26/2023 1:15 PM Medical Record Number: [id number] Patient Account Number: [id number] Date of Birth/Sex: Treating RN: [birth year] (89 y.o. [name], [name] Primary Care Provider: [name] Other Clinician: Referring Provider: Treating Provider/Extender: Claudie Leach Weeks in Treatment: 2 Debridement Performed for Assessment: Wound #12 Left T Great oe Performed By: Physician Duanne Guess, MD Debridement Type: Debridement Severity of Tissue Pre Debridement: Fat layer exposed Level of Consciousness (Pre-procedure): Awake and Alert Pre-procedure Verification/Time Out Yes - 14:15 Taken: Start Time: 14:16 Pain Control: Lidocaine 4% T opical Solution Percent of Wound Bed Debrided: 100% T Area Debrided (cm): otal 1.2 Tissue and other material debrided: Non-Viable, Slough, Slough Level: Non-Viable Tissue Debridement Description: Selective/Open Wound Instrument: Curette Bleeding: Minimum Hemostasis Achieved: Pressure Procedural Pain: 4 Post Procedural Pain: 3 Response to  Treatment: Procedure was tolerated well Level of Consciousness (Post- Awake and Alert procedure): Post Debridement Measurements of Total Wound Length: (cm) 1.7 Width: (cm) 0.9 Depth: (cm) 0.1 Volume: (cm) 0.12 Character of Wound/Ulcer Post Debridement: Stable Severity of Tissue Post Debridement: Fat layer exposed Post Procedure Diagnosis Same as Pre-procedure Notes scribed for Dr. Lady Gary by Zenaida Deed, RN Electronic Signature(s) Signed: 03/26/2023 4:23:46 PM By: Duanne Guess MD FACS Signed: 03/26/2023 4:59:36 PM By: Zenaida Deed RN, BSN Entered By: Zenaida Deed on 03/26/2023 14:28:42 -------------------------------------------------------------------------------- Debridement Details Patient Name: Date of Service: [name]. 03/26/2023 1:15 PM Medical Record Number: [id number] Patient Account Number: [id number] Date of Birth/Sex: Treating RN: 17-Nov-1927 (89 y.o. [name] Primary Care Provider: [name] Other Clinician: Referring Provider: Treating Provider/Extender: Claudie Leach Weeks in Treatment: 2 Debridement Performed for Assessment: Wound #13 Left,Dorsal T Great oe Performed By: Physician Duanne Guess, MD Debridement Type: Debridement Nhung, Danko [name] ([id number]) [id number] Page 4 of 18 Severity of Tissue Pre Debridement: Fat layer exposed Level of Consciousness (Pre-procedure): Awake and Alert Pre-procedure Verification/Time Out Yes - 14:15 Taken: Start Time: 14:16 Pain Control: Lidocaine 4% T opical Solution Percent of Wound Bed Debrided: 100% T Area Debrided (cm): otal 0.08 Tissue and other material debrided: Non-Viable, Slough, Slough Level: Non-Viable Tissue Debridement Description: Selective/Open Wound Instrument: Curette Bleeding: Minimum Hemostasis Achieved: Pressure Procedural Pain: 4 Post Procedural Pain: 3 Response to Treatment: Procedure was tolerated well Level of  Consciousness (Post- Awake and Alert procedure): Post Debridement Measurements of Total Wound Length: (cm) 0.2 Width: (cm) 0.5 Depth: (cm) 0.1 Volume: (cm) 0.008 Character of Wound/Ulcer Post Debridement: Stable Severity of Tissue Post Debridement: Fat layer exposed Post Procedure Diagnosis Same as Pre-procedure Notes scribed for Dr. Lady Gary by Zenaida Deed, RN Electronic Signature(s) Signed: 03/26/2023 4:23:46 PM By: Duanne Guess MD FACS Signed: 03/26/2023 4:59:36 PM By: Zenaida Deed RN, BSN Entered By: Zenaida Deed on 03/26/2023 14:29:17 -------------------------------------------------------------------------------- Debridement Details Patient Name: Date of Service: [name]. 03/26/2023 1:15 PM Medical Record Number: [id number] Patient Account Number: [id number] Date of Birth/Sex: Treating RN: 1928-07-18 (89 y.o. [name] Primary Care Provider: [name] Other Clinician: Referring Provider: Treating Provider/Extender: Claudie Leach Weeks in Treatment: 2 Debridement Performed for Assessment: Wound #9 Left Calcaneus Performed By: Physician Duanne Guess, MD Debridement Type: Debridement Level of Consciousness (Pre-procedure): Awake and Alert Pre-procedure Verification/Time Out Yes - 14:15 Taken: Start Time: 14:16 Pain Control: Lidocaine 4% T opical Solution Percent of Wound Bed Debrided: 100% T Area Debrided (cm): otal 0.57 Tissue and other material debrided: Viable, Non-Viable, Slough, Slough Level: Non-Viable Tissue Debridement Description: Selective/Open Wound  Instrument: Curette Bleeding: Minimum Hemostasis Achieved: Pressure Procedural Pain: 4 Post Procedural Pain: 3 Response to Treatment: Procedure was tolerated well Level of Consciousness (Post- Awake and Alert procedure): [name], [name] ([id number]) [id number] Page 5 of 18 Post Debridement Measurements of Total Wound Length: (cm)  0.9 Stage: Category/Stage II Width: (cm) 0.8 Depth: (cm) 0.4 Volume: (cm) 0.226 Character of Wound/Ulcer Post Debridement: Stable Post Procedure Diagnosis Same as Pre-procedure Notes scribed for Dr. Lady Gary by Zenaida Deed, RN Electronic Signature(s) Signed: 03/26/2023 4:23:46 PM By: Duanne Guess MD FACS Signed: 03/26/2023 4:59:36 PM By: Zenaida Deed RN, BSN Entered By: Zenaida Deed on 03/26/2023 14:30:00 -------------------------------------------------------------------------------- Debridement Details Patient Name: Date of Service: [name]. 03/26/2023 1:15 PM Medical Record Number: [id number] Patient Account Number: [id number] Date of Birth/Sex: Treating RN: 1928/01/03 (89 y.o. [name], [name] Primary Care Provider: [name] Other Clinician: Referring Provider: Treating Provider/Extender: Claudie Leach Weeks in Treatment: 2 Debridement Performed for Assessment: Wound #15 Right,Dorsal T Great oe Performed By: Physician Duanne Guess, MD Debridement Type: Debridement Severity of Tissue Pre Debridement: Fat layer exposed Level of Consciousness (Pre-procedure): Awake and Alert Pre-procedure Verification/Time Out Yes - 14:15 Taken: Start Time: 14:16 Pain Control: Lidocaine 4% Topical Solution Percent of Wound Bed Debrided: 100% T Area Debrided (cm): otal 0.07 Tissue and other material debrided: Non-Viable, Eschar Level: Non-Viable Tissue Debridement Description: Selective/Open Wound Instrument: Curette Bleeding: Minimum Hemostasis Achieved: Pressure Procedural Pain: 4 Post Procedural Pain: 3 Response to Treatment: Procedure was tolerated well Level of Consciousness (Post- Awake and Alert procedure): Post Debridement Measurements of Total Wound Length: (cm) 0.3 Width: (cm) 0.3 Depth: (cm) 0.1 Volume: (cm) 0.007 Character of Wound/Ulcer Post Debridement: Stable Severity of Tissue Post Debridement: Fat layer exposed Post  Procedure Diagnosis Same as Pre-procedure Notes scribed for Dr. Lady Gary by Zenaida Deed, RN Electronic Signature(s) Signed: 03/26/2023 4:23:46 PM By: Duanne Guess MD FACS [name], [name] ([id number]) [id number] Page 6 of 18 Signed: 03/26/2023 4:59:36 PM By: Zenaida Deed RN, BSN Entered By: Zenaida Deed on 03/26/2023 14:32:41 -------------------------------------------------------------------------------- HPI Details Patient Name: Date of Service: [name]. 03/26/2023 1:15 PM Medical Record Number: [id number] Patient Account Number: [id number] Date of Birth/Sex: Treating RN: [birth year] (89 y.o. F) Primary Care Provider: [name] Other Clinician: Referring Provider: Treating Provider/Extender: Claudie Leach Weeks in Treatment: 2 History of Present Illness HPI Description: Admission 01/28/2022 Ms. 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 She states she has tried Medihoney and silver alginate in the past to this area with little benefit. She also has 2 small areas limited to skin breakdown to her lower extremities bilaterally. She has chronic venous insufficiency but not has not been wearing her compression stockings. She states she bumped her legs against an object and not so the wound started. She has been using Medihoney to the sites. She denies signs of infection. 6/2; patient presents for follow-up. She had an x-ray of her right foot done at last clinic visit and this was negative for evidence of osteomyelitis. She also had a wound culture done that showed extra high levels of Staph aureus. I recommended Keystone antibiotics for this and this was  ordered. She had ABIs with TBI's done as well that showed  monophasic waveforms to the right foot with TBI of 0 and an ABI of 0.52. Urgent referral was made to vein and vascular and she saw Dr. Durwin Nora on 6/1, yesterday and he recommended an arteriogram. This is scheduled for 6/16. Patient also reports a new wound to the right great toe. This is a blister that has ruptured. She also reports increased erythema to the toe. 6/6; the patient was worked in urgently today at the insistence of her daughter out of concern for a new wound on the lateral part of the plantar right great toe. She has her original postsurgical wound after the amputation of the right second toe, she has a wound on the medial part of the right great toe. The patient is apparently going for an angiogram by Dr. Durwin Nora in 2 weeks time. 6/13; patient presents for follow-up. She has been using bacitracin to the abrasion on the right great toe. She has been using collagen and Keystone antibiotic to the amputation site. She has no issues or complaints today. She denies signs of infection. 6/22; patient presents for follow-up. She states that her abdominal aortogram was canceled due to her renal function. She has been using Keystone antibiotics to the amputation site and Medihoney to the right great toe wound. At the pace of the right great toe she has a slitlike open area that she thinks was caused by the tape from the dressing. 6/29; patient presents for follow-up. She has been using Keystone antibiotics to the amputation site along with collagen. She has been using Medihoney to the right great toe wound. She has no other wounds. She denies signs of infection. 7/13; patient presents for follow-up. She has been using Keystone antibiotics and collagen to the wound sites. She currently denies signs of infection. She states she is scheduled to see me nephrology next month. 7/20; patient presents for follow-up. She continue Keystone antibiotics  and collagen to the wound sites. She has no issues or complaints today. 8/1; patient presents for follow up. She continues to use keystone antibiotics and collagen to the wound sites. She has no issues or complaints today. 8/15; patient presents for follow-up. She has been using Keystone antibiotics and collagen to the wound sites. She followed up with her nephrologist who made medication changes. She is supposed to get a repeat BMP in 2 weeks. Her decrease in renal function was a limiting factor in obtaining an arteriogram for potential intervention for revascularization. She currently denies signs of infection. 9/2; patient presents for follow-up. She has been using Keystone antibiotic and collagen to the wound beds. She has no issues or complaints today. Reports there has been improvement in kidney function however not cleared to have her arteriogram just yet. 10/10; patient presents for follow-up. She has been using Keystone antibiotic and collagen to the wound beds. She reports 2 new wounds 1 to the anterior right lower extremity and another to the plantar aspect of the right foot. She states that the right plantar foot wound was caused by the home health nurse changing the dressing and causing a skin tear. She is not sure how the right anterior leg wound started. It appears to be from trauma. She denies signs of infection. 10/19; patient presents for follow-up. She has been using Keystone antibiotic and collagen to the right great toe wound. She is been using silver alginate to the right anterior and right plantar foot wound. She has been using Tubigrip to the right lower extremity. The plantar foot wound has  healed. She has no issues or complaints today. 11/3; since the patient was last here she was seen in urgent care apparently for an area on the dorsal aspect of the right fifth toe perhaps over the PIP. I saw a picture of this on the daughter's cell phone. There was slough on this. Urgent  care gave him doxycycline. She is also changed the dressing to all wounds back the [facility] and collagen which includes her right leg and left first toe 11/16; patient presents for follow-up. She has a new wound to the left knee. She states she fell. She has been using antibiotic ointment and collagen to this area. She has been using collagen and Keystone antibiotic to the right great toe wound. The anterior right leg wound is healed. She denies signs of infection. 11/30; patient presents for follow-up. The right great toe wound has healed. She has 1 remaining wound to the left knee. She has been using Hydrofera Blue and Medihoney here. 12/19; patient presents for follow-up. Her left knee wound has healed. She has no issues or complaints today. 09/30/2022 Patient's daughter called for an appointment due to increased swelling to her lower extremities bilaterally. Today patient presents with increased swelling to her [name], [name] ([id number]) [id number] Page 7 of 18 lower extremities although there is no increased redness or warmth. She was evaluated by her nephrologist who increased her diuretics. Per patient and daughter her swelling has gone down to the leg Over the past several days. She does not have any open wounds. She was advised to elevate her legs and not consume excess salt By her PCP and nephrologist. Patient has compression stockings that she has been using sporadically. READMISSION 03/10/2023 Since her last visit to the clinic, the patient has been hospitalized at least twice, in February with COVID pneumonia and CHF exacerbation. She was discharged to a skilled nursing facility and then readmitted in April with fevers. She was noted to have pressure ulcers on her heels and sacrum. Per family declined skilled nursing placement upon discharge and she has apparently been residing at home. She has followed with the vascular surgery clinic regarding peripheral  artery disease and due to ulcerations on her feet, she ultimately underwent arteriography with balloon angioplasties of the superficial femoral artery, popliteal artery and peroneal artery on the left, along with shockwave ultrasound assisted balloon angioplasty of the popliteal artery and distal SFA. Given her multiple significant medical comorbidities, she is not a candidate for open surgery. She is deemed to be maximally vascularized this procedure, which was performed on 04 March 2023. T oday, she has a stage III pressure ulcer on her left heel and wounds on the PIP joints of her right third and left second toes. No sacral wound is present and the ulcer on her left heel has closed. 03/18/2023: She has a new ulcer on her left great toe. It was just noticed yesterday and the etiology is unknown. The fat layer is exposed and there is some slough accumulation. The other wounds are all essentially unchanged in size. There is a little bit more granulation tissue on the other toe wounds, but bone is still frankly exposed on both sides. The heel ulcer has thick slough accumulation. 03/26/2023: She has a new wound on the tip of her left third toe. It is black. Neither the patient nor her daughter are aware of how it began. The other wounds are stable. Electronic Signature(s) Signed: 03/26/2023 4:12:17 PM By: Duanne Guess MD FACS Entered By:  Duanne Guess on 03/26/2023 16:12:16 -------------------------------------------------------------------------------- Physical Exam Details Patient Name: Date of Service: KEOSHA, ROSSA 03/26/2023 1:15 PM Medical Record Number: [id number] Patient Account Number: [id number] Date of Birth/Sex: Treating RN: 10-18-1927 (89 y.o. F) Primary Care Provider: [name] Other Clinician: Referring Provider: Treating Provider/Extender: Claudie Leach Weeks in Treatment: 2 Constitutional Slightly hypertensive. . . . no acute  distress. Respiratory Normal work of breathing on room air. Notes 03/26/2023: She has a new wound on the tip of her left third toe. It is black. Neither the patient nor her daughter are aware of how it began. The other wounds are stable. Electronic Signature(s) Signed: 03/26/2023 4:14:50 PM By: Duanne Guess MD FACS Entered By: Duanne Guess on 03/26/2023 16:14:50 -------------------------------------------------------------------------------- Physician Orders Details Patient Name: Date of Service: [name]. 03/26/2023 1:15 PM Medical Record Number: [id number] Patient Account Number: [id number] Date of Birth/Sex: Treating RN: 1928/09/01 (89 y.o. 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 - Dr. Lady Gary - room 3 Return A Tuesday 7/23 @ 1:45 pm Anesthetic (In clinic) Topical Lidocaine 4% applied to wound bed Bathing/ Shower/ Hygiene May shower and wash wound with soap and water. Edema Control - Lymphedema / SCD / Other Elevate legs to the level of the heart or above for 30 minutes daily and/or when sitting for  3-4 times a day throughout the day. Avoid standing for long periods of time. Patient to wear own compression stockings every day. Additional Orders / Instructions Follow Nutritious Diet - add in protein shakes every day to diet - recommend premier protein 500 mg x3 a day vitamin C, zinc 30-50 mg per day Home Health New wound care orders this week; continue Home Health for wound care. May utilize formulary equivalent dressing for wound treatment orders unless otherwise specified. - HH 2x/week for 2 weeks the recertify for wound care Other Home Health Orders/Instructions: - [facility] Home Wound Treatment Wound #10 - T Second oe Wound Laterality: Left Cleanser: Soap and Water 1 x Per Day/30 Days Discharge Instructions: May shower and wash wound with dial antibacterial soap and water prior to dressing change. Cleanser: Wound Cleanser 1 x Per Day/30 Days Discharge Instructions: Cleanse the wound with wound cleanser prior to applying a clean dressing using gauze sponges, not tissue or cotton balls. Prim Dressing: Promogran Prisma Matrix, 4.34 (sq in) (silver collagen) (Generic) 1 x Per Day/30 Days ary Discharge Instructions: Moisten collagen with saline or hydrogel Secondary Dressing: Woven Gauze Sponge, Non-Sterile 4x4 in (Generic) 1 x Per Day/30 Days Discharge Instructions: Apply over primary dressing as directed. Secured With: 57M Medipore H Soft Cloth Surgical T ape, 4 x 10 (in/yd) (Generic) 1 x Per Day/30 Days Discharge Instructions: Secure with tape as directed. Wound #11 - T Third oe Wound Laterality: Right Cleanser: Soap and Water  1 x Per Day/30 Days Discharge Instructions: May shower and wash wound with dial antibacterial soap and water prior to dressing change. Cleanser: Wound Cleanser 1 x Per Day/30 Days Discharge Instructions: Cleanse the wound with wound cleanser prior to applying a clean dressing using gauze sponges, not tissue or cotton balls. Prim Dressing: Promogran Prisma Matrix,  4.34 (sq in) (silver collagen) (Generic) 1 x Per Day/30 Days ary Discharge Instructions: Moisten collagen with saline or hydrogel Secondary Dressing: Woven Gauze Sponge, Non-Sterile 4x4 in (Generic) 1 x Per Day/30 Days Discharge Instructions: Apply over primary dressing as directed. Secured With: 67M Medipore H Soft Cloth Surgical T ape, 4 x 10 (in/yd) (Generic) 1 x Per Day/30 Days Discharge Instructions: Secure with tape as directed. Wound #12 - T Great oe Wound Laterality: Left Cleanser: Soap and Water 1 x Per Day/30 Days Discharge Instructions: May shower and wash wound with dial antibacterial soap and water prior to dressing change. Cleanser: Wound Cleanser 1 x Per Day/30 Days Discharge Instructions: Cleanse the wound with wound cleanser prior to applying a clean dressing using gauze sponges, not tissue or cotton balls. AIMA, MCWHIRT ([id number]) [id number] Page 9 of 18 Prim Dressing: Promogran Prisma Matrix, 4.34 (sq in) (silver collagen) (Generic) 1 x Per Day/30 Days ary Discharge Instructions: Moisten collagen with saline or hydrogel Secondary Dressing: Woven Gauze Sponge, Non-Sterile 4x4 in (Generic) 1 x Per Day/30 Days Discharge Instructions: Apply over primary dressing as directed. Secured With: 67M Medipore H Soft Cloth Surgical T ape, 4 x 10 (in/yd) (Generic) 1 x Per Day/30 Days Discharge Instructions: Secure with tape as directed. Wound #13 - T Great oe Wound Laterality: Dorsal, Left Cleanser: Soap and Water 1 x Per Day/30 Days Discharge Instructions: May shower and wash wound with dial antibacterial soap and water prior to dressing change. Cleanser: Wound Cleanser 1 x Per Day/30 Days Discharge Instructions: Cleanse the wound with wound cleanser prior to applying a clean dressing using gauze sponges, not tissue or cotton balls. Prim Dressing: Promogran Prisma Matrix, 4.34 (sq in) (silver collagen) (Generic) 1 x Per Day/30 Days ary Discharge  Instructions: Moisten collagen with saline or hydrogel Secondary Dressing: Woven Gauze Sponge, Non-Sterile 4x4 in (Generic) 1 x Per Day/30 Days Discharge Instructions: Apply over primary dressing as directed. Secured With: 67M Medipore H Soft Cloth Surgical T ape, 4 x 10 (in/yd) (Generic) 1 x Per Day/30 Days Discharge Instructions: Secure with tape as directed. Wound #14 - T Third oe Wound Laterality: Left Cleanser: Soap and Water 1 x Per Day/30 Days Discharge Instructions: May shower and wash wound with dial antibacterial soap and water prior to dressing change. Cleanser: Wound Cleanser 1 x Per Day/30 Days Discharge Instructions: Cleanse the wound with wound cleanser prior to applying a clean dressing using gauze sponges, not tissue or cotton balls. Prim Dressing: Promogran Prisma Matrix, 4.34 (sq in) (silver collagen) (Generic) 1 x Per Day/30 Days ary Discharge Instructions: Moisten collagen with saline or hydrogel Secondary Dressing: Woven Gauze Sponge, Non-Sterile 4x4 in (Generic) 1 x Per Day/30 Days Discharge Instructions: Apply over primary dressing as directed. Secured With: 67M Medipore H Soft Cloth Surgical T ape, 4 x 10 (in/yd) (Generic) 1 x Per Day/30 Days Discharge Instructions: Secure with tape as directed. Wound #15 - T Great oe Wound Laterality: Dorsal, Right Cleanser: Soap and Water 1 x Per Day/30 Days Discharge Instructions: May shower and wash wound with dial antibacterial soap and water prior to dressing change. Cleanser: Wound Cleanser 1 x Per Day/30 Days Discharge  Instructions: Cleanse the wound with wound cleanser prior to applying a clean dressing using gauze sponges, not tissue or cotton balls. Prim Dressing: Promogran Prisma Matrix, 4.34 (sq in) (silver collagen) (Generic) 1 x Per Day/30 Days ary Discharge Instructions: Moisten collagen with saline or hydrogel Secondary Dressing: Woven Gauze Sponge, Non-Sterile 4x4 in (Generic) 1 x Per Day/30 Days Discharge  Instructions: Apply over primary dressing as directed. Secured With: 45M Medipore H Soft Cloth Surgical T ape, 4 x 10 (in/yd) (Generic) 1 x Per Day/30 Days Discharge Instructions: Secure with tape as directed. Wound #9 - Calcaneus Wound Laterality: Left Cleanser: Soap and Water 1 x Per Day/30 Days Discharge Instructions: May shower and wash wound with dial antibacterial soap and water prior to dressing change. Cleanser: Wound Cleanser 1 x Per Day/30 Days Discharge Instructions: Cleanse the wound with wound cleanser prior to applying a clean dressing using gauze sponges, not tissue or cotton balls. Prim Dressing: Promogran Prisma Matrix, 4.34 (sq in) (silver collagen) (Generic) 1 x Per Day/30 Days ary Discharge Instructions: Moisten collagen with saline or hydrogel Secondary Dressing: ALLEVYN Heel 4 1/2in x 5 1/2in / 10.5cm x 13.5cm (Generic) 1 x Per Day/30 Days Discharge Instructions: Apply over primary dressing as directed. Secondary Dressing: Woven Gauze Sponge, Non-Sterile 4x4 in (Generic) 1 x Per Day/30 Days Discharge Instructions: Apply over primary dressing as directed. Secured With: 45M Medipore H Soft Cloth Surgical T ape, 4 x 10 (in/yd) (Generic) 1 x Per Day/30 Days Discharge Instructions: Secure with tape as directed. [name], [name] ([id number]) [id number] Page 10 of 18 Laboratory naerobe culture (MICRO) - left 3rd toe Bacteria identified in Unspecified specimen by A LOINC Code: 635-3 Convenience Name: Anaerobic culture Patient Medications llergies: penicillin, erythromycin base, green dyes, iodine, sulfasalazine, tramadol, oxycodone, codeine A Notifications Medication Indication Start End 03/26/2023 levofloxacin DOSE oral 750 mg tablet - 1 tab p.o. daily x 7 days Electronic Signature(s) Signed: 03/26/2023 4:23:46 PM By: Duanne Guess MD FACS Previous Signature: 03/26/2023 4:17:14 PM Version By: Duanne Guess MD FACS Entered By: Duanne Guess  on 03/26/2023 16:18:29 -------------------------------------------------------------------------------- Problem List Details Patient Name: Date of Service: [name]. 03/26/2023 1:15 PM Medical Record Number: [id number] Patient Account Number: [id number] Date of Birth/Sex: Treating RN: 25-Jun-1928 (89 y.o. 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 03/10/2023 No Yes without evidence of necrosis 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 03/10/2023 No Yes without evidence of necrosis L97.522 Non-pressure chronic ulcer of other part of left foot with fat layer exposed 03/18/2023 No Yes I73.9 Peripheral vascular disease, unspecified 03/10/2023 No Yes I50.32 Chronic diastolic (congestive) heart failure 03/10/2023 No Yes N18.32 Chronic kidney disease, stage 3b 03/10/2023 No Yes Mosso, Atara [name] ([id number]) [id number] Page 11 of 18 I87.2 Venous insufficiency (chronic) (peripheral) 03/10/2023 No Yes Inactive Problems Resolved Problems Electronic Signature(s) Signed: 03/26/2023 4:10:14 PM By: Duanne Guess MD FACS Entered By: Duanne Guess on 03/26/2023 16:10:14 -------------------------------------------------------------------------------- Progress Note Details Patient Name: Date of Service: [name]. 03/26/2023 1:15 PM Medical Record Number: [id number] Patient Account Number: [id number] Date of Birth/Sex: Treating RN: [birth year] (89 y.o. F) Primary Care Provider: [name] Other Clinician: Referring Provider: Treating Provider/Extender: Merceda Elks,  Romana Weeks in Treatment: 2 Subjective Chief Complaint Information obtained from Patient 01/28/2022; right second toe amputation site dehiscence and bilateral lower extremity wounds. 03/10/2023: pressure ulcers of right heel, ulcers on toes History of Present Illness (HPI) Admission 01/28/2022 Ms. 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 She states she has tried Medihoney and silver alginate in the past to this area with little benefit. She also has 2 small areas limited to skin breakdown to her lower extremities bilaterally. She has chronic venous insufficiency but not has not been wearing her compression stockings. She states she bumped her legs against an object and not so the wound started. She has been using Medihoney to the sites. She denies signs of infection. 6/2; patient presents for follow-up. She had an x-ray of her right foot done at last clinic visit and this was negative for evidence of osteomyelitis. She also had a wound culture done that showed extra high levels of Staph aureus. I recommended Keystone antibiotics for this and this was ordered. She had ABIs with TBI's done as well that showed monophasic waveforms to the right foot with TBI of 0 and an ABI of 0.52. Urgent referral was made to vein and vascular and she saw Dr. Durwin Nora on 6/1, yesterday and he recommended an arteriogram. This is scheduled for 6/16. Patient also reports a new wound to the right great toe. This is a blister that has ruptured. She also reports increased erythema to the toe. 6/6; the patient was worked in urgently today at the insistence of her daughter out of concern for a new wound on the lateral part of the plantar right great  toe. She has her original postsurgical wound after the amputation of the right second toe, she has a wound on the medial part of the right great toe. The patient is apparently going for an angiogram by Dr. Durwin Nora in 2 weeks time. 6/13; patient presents for follow-up. She has been using bacitracin to the abrasion on the right great toe. She has been using collagen and Keystone antibiotic to the amputation site. She has no issues or complaints today. She denies signs of infection. 6/22; patient presents for follow-up. She states that her abdominal aortogram was canceled due to her renal function. She has been using Keystone antibiotics to the amputation site and Medihoney to the right great toe wound. At the pace of the right great toe she has a slitlike open area that she thinks was caused by the tape from the dressing. 6/29; patient presents for follow-up. She has been using Keystone antibiotics to the amputation site along with collagen. She has been using Medihoney to the right great toe wound. She has no other wounds. She denies signs of infection. 7/13; patient presents for follow-up. She has been using Keystone antibiotics and collagen to the wound sites. She currently denies signs of infection. She states she is scheduled to see me nephrology next month. 7/20; patient presents for follow-up. She continue Keystone antibiotics and collagen to the wound sites. She has no issues or complaints today. 8/1; patient presents for follow up. She continues to use keystone antibiotics and collagen to the wound sites. She has no issues or complaints today. 8/15; patient presents for follow-up. She  has been using Keystone antibiotics and collagen to the wound sites. She followed up with her nephrologist who made medication changes. She is supposed to get a repeat BMP in 2 weeks. Her decrease in renal function was a limiting factor in obtaining an arteriogram for potential intervention for revascularization.  She currently denies signs of infection. 9/2; patient presents for follow-up. She has been using Keystone antibiotic and collagen to the wound beds. She has no issues or complaints today. Reports there has been improvement in kidney function however not cleared to have her arteriogram just yet. 10/10; patient presents for follow-up. She has been using Keystone antibiotic and collagen to the wound beds. She reports 2 new wounds 1 to the anterior right lower extremity and another to the plantar aspect of the right foot. She states that the right plantar foot wound was caused by the home health nurse changing [name], [name] ([id number]) [id number] Page 12 of 18 the dressing and causing a skin tear. She is not sure how the right anterior leg wound started. It appears to be from trauma. She denies signs of infection. 10/19; patient presents for follow-up. She has been using Keystone antibiotic and collagen to the right great toe wound. She is been using silver alginate to the right anterior and right plantar foot wound. She has been using Tubigrip to the right lower extremity. The plantar foot wound has healed. She has no issues or complaints today. 11/3; since the patient was last here she was seen in urgent care apparently for an area on the dorsal aspect of the right fifth toe perhaps over the PIP. I saw a picture of this on the daughter's cell phone. There was slough on this. Urgent care gave him doxycycline. She is also changed the dressing to all wounds back the [facility] and collagen which includes her right leg and left first toe 11/16; patient presents for follow-up. She has a new wound to the left knee. She states she fell. She has been using antibiotic ointment and collagen to this area. She has been using collagen and Keystone antibiotic to the right great toe wound. The anterior right leg wound is healed. She denies signs of infection. 11/30; patient presents for  follow-up. The right great toe wound has healed. She has 1 remaining wound to the left knee. She has been using Hydrofera Blue and Medihoney here. 12/19; patient presents for follow-up. Her left knee wound has healed. She has no issues or complaints today. 09/30/2022 Patient's daughter called for an appointment due to increased swelling to her lower extremities bilaterally. Today patient presents with increased swelling to her lower extremities although there is no increased redness or warmth. She was evaluated by her nephrologist who increased her diuretics. Per patient and daughter her swelling has gone down to the leg Over the past several days. She does not have any open wounds. She was advised to elevate her legs and not consume excess salt By her PCP and nephrologist. Patient has compression stockings that she has been using sporadically. READMISSION 03/10/2023 Since her last visit to the clinic, the patient has been hospitalized at least twice, in February with COVID pneumonia and CHF exacerbation. She was discharged to a skilled nursing facility and then readmitted in April with fevers. She was noted to have pressure ulcers on her heels and sacrum. Per family declined skilled nursing placement upon discharge and she has apparently been residing at home. She has followed with the vascular surgery clinic regarding peripheral artery  disease and due to ulcerations on her feet, she ultimately underwent arteriography with balloon angioplasties of the superficial femoral artery, popliteal artery and peroneal artery on the left, along with shockwave ultrasound assisted balloon angioplasty of the popliteal artery and distal SFA. Given her multiple significant medical comorbidities, she is not a candidate for open surgery. She is deemed to be maximally vascularized this procedure, which was performed on 04 March 2023. T oday, she has a stage III pressure ulcer on her left heel and wounds on the PIP joints  of her right third and left second toes. No sacral wound is present and the ulcer on her left heel has closed. 03/18/2023: She has a new ulcer on her left great toe. It was just noticed yesterday and the etiology is unknown. The fat layer is exposed and there is some slough accumulation. The other wounds are all essentially unchanged in size. There is a little bit more granulation tissue on the other toe wounds, but bone is still frankly exposed on both sides. The heel ulcer has thick slough accumulation. 03/26/2023: She has a new wound on the tip of her left third toe. It is black. Neither the patient nor her daughter are aware of how it began. The other wounds are stable. Patient History Information obtained from Patient, Caregiver, Chart. Family History Cancer - Siblings, Heart Disease, Stroke - Mother, No family history of Diabetes. Social History Former smoker - quit 2003, Marital Status - Widowed, Alcohol Use - Never, Drug Use - No History, Caffeine Use - Never. Medical History Eyes Patient has history of Cataracts Hematologic/Lymphatic Patient has history of Anemia Respiratory Denies history of Chronic Obstructive Pulmonary Disease (COPD) Cardiovascular Patient has history of Arrhythmia - a-fib, Congestive Heart Failure, Hypotension, Peripheral Venous Disease Musculoskeletal Patient has history of 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 Neurologic Patient has history of Neuropathy Hospitalization/Surgery History - Abdominal aortogram w/lower extremity. - Amputation toe (Right). - Colonoscopy. - Elbow surgery. - Laparoscopic hysterectomy. Medical A Surgical History Notes nd Hematologic/Lymphatic Thrombocytopenia, Anticoagulant long-term use Respiratory Pulmonary embolus Gastrointestinal Hiatal hernia Endocrine Hyperthyroidism, Hypothyroidism Genitourinary CKD stage III Musculoskeletal arthritis Wedin, [name] ([id number]) [id number]  Page 13 of 18 Objective Constitutional Slightly hypertensive. no acute distress. Vitals Time Taken: 1:08 AM, Height: 64 in, Weight: 157 lbs, BMI: 26.9, Temperature: 98.5 F, Pulse: 81 bpm, Respiratory Rate: 20 breaths/min, Blood Pressure: 149/68 mmHg. Respiratory Normal work of breathing on room air. General Notes: 03/26/2023: She has a new wound on the tip of her left third toe. It is black. Neither the patient nor her daughter are aware of how it began. The other wounds are stable. Integumentary (Hair, Skin) Wound #10 status is Open. Original cause of wound was Pressure Injury. The date acquired was: 10/09/2022. The wound has been in treatment 2 weeks. The wound is located on the Left T Second. The wound measures 0.7cm length x 0.9cm width x 0.1cm depth; 0.495cm^2 area and 0.049cm^3 volume. There is oe bone, joint, and Fat Layer (Subcutaneous Tissue) exposed. There is no tunneling or undermining noted. There is a small amount of serosanguineous drainage noted. The wound margin is distinct with the outline attached to the wound base. There is large (67-100%) red granulation within the wound bed. There is a small (1-33%) amount of necrotic tissue within the wound bed including Adherent Slough. The periwound skin appearance had no abnormalities noted for texture. The periwound skin appearance had no abnormalities noted for moisture. The periwound skin appearance had no abnormalities  noted for color. Periwound temperature was noted as No Abnormality. Wound #11 status is Open. Original cause of wound was Pressure Injury. The date acquired was: 10/09/2022. The wound has been in treatment 2 weeks. The wound is located on the Right T Third. The wound measures 0.9cm length x 1.1cm width x 0.1cm depth; 0.778cm^2 area and 0.078cm^3 volume. There is oe bone, joint, and Fat Layer (Subcutaneous Tissue) exposed. There is a medium amount of serosanguineous drainage noted. The wound margin is distinct with the  outline attached to the wound base. There is medium (34-66%) pink granulation within the wound bed. There is a medium (34-66%) amount of necrotic tissue within the wound bed including Adherent Slough. The periwound skin appearance had no abnormalities noted for texture. The periwound skin appearance had no abnormalities noted for moisture. The periwound skin appearance had no abnormalities noted for color. Periwound temperature was noted as No Abnormality. Wound #12 status is Open. Original cause of wound was Gradually Appeared. The date acquired was: 03/17/2023. The wound has been in treatment 1 weeks. The wound is located on the Left T Great. The wound measures 1.7cm length x 0.9cm width x 0.1cm depth; 1.202cm^2 area and 0.12cm^3 volume. There is Fat oe Layer (Subcutaneous Tissue) exposed. There is no tunneling or undermining noted. There is a medium amount of serous drainage noted. The wound margin is flat and intact. There is small (1-33%) pink granulation within the wound bed. There is a large (67-100%) amount of necrotic tissue within the wound bed including Adherent Slough. The periwound skin appearance had no abnormalities noted for texture. The periwound skin appearance had no abnormalities noted for moisture. The periwound skin appearance exhibited: Erythema. The surrounding wound skin color is noted with erythema which is circumferential. Periwound temperature was noted as No Abnormality. The periwound has tenderness on palpation. Wound #13 status is Open. Original cause of wound was Not Known. The date acquired was: 03/24/2023. The wound is located on the Left,Dorsal T Great. The oe wound measures 0.2cm length x 0.5cm width x 0.1cm depth; 0.079cm^2 area and 0.008cm^3 volume. There is no tunneling or undermining noted. Wound #14 status is Open. Original cause of wound was Gradually Appeared. The date acquired was: 03/24/2023. The wound is located on the Left T Third. oe The wound measures  0.4cm length x 0.4cm width x 0.1cm depth; 0.126cm^2 area and 0.013cm^3 volume. There is Fat Layer (Subcutaneous Tissue) exposed. There is no tunneling or undermining noted. There is a medium amount of serosanguineous drainage noted. The wound margin is distinct with the outline attached to the wound base. There is medium (34-66%) red granulation within the wound bed. There is a medium (34-66%) amount of necrotic tissue within the wound bed including Eschar. The periwound skin appearance exhibited: Scarring, Hemosiderin Staining. The periwound skin appearance did not exhibit: Dry/Scaly, Maceration. Periwound temperature was noted as No Abnormality. Wound #15 status is Open. Original cause of wound was Gradually Appeared. The date acquired was: 03/26/2023. The wound is located on the Right,Dorsal T oe Great. The wound measures 0.3cm length x 0.3cm width x 0.1cm depth; 0.071cm^2 area and 0.007cm^3 volume. There is Fat Layer (Subcutaneous Tissue) exposed. There is no tunneling or undermining noted. There is a none present amount of drainage noted. The wound margin is flat and intact. There is large (67- 100%) pink granulation within the wound bed. The periwound skin appearance had no abnormalities noted for texture. The periwound skin appearance had no abnormalities noted for moisture. The periwound  skin appearance had no abnormalities noted for color. Periwound temperature was noted as No Abnormality. The periwound has tenderness on palpation. Wound #9 status is Open. Original cause of wound was Pressure Injury. The date acquired was: 10/22/2022. The wound has been in treatment 2 weeks. The wound is located on the Left Calcaneus. The wound measures 0.9cm length x 0.8cm width x 0.4cm depth; 0.565cm^2 area and 0.226cm^3 volume. There is Fat Layer (Subcutaneous Tissue) exposed. There is no tunneling or undermining noted. There is a medium amount of serosanguineous drainage noted. The wound margin is distinct  with the outline attached to the wound base. There is small (1-33%) red granulation within the wound bed. There is a large (67-100%) amount of necrotic tissue within the wound bed including Adherent Slough. The periwound skin appearance had no abnormalities noted for texture. The periwound skin appearance had no abnormalities noted for color. The periwound skin appearance exhibited: Dry/Scaly. Periwound temperature was noted as No Abnormality. The periwound has tenderness on palpation. 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 There was a Selective/Open Wound Non-Viable Tissue Debridement oe with a total area of 0.78 sq cm performed by Duanne Guess, MD. With the following instrument(s): Curette to remove Non-Viable tissue/material. Material removed includes Eschar and Slough and after achieving pain control using Lidocaine 4% T opical Solution. No specimens were taken. A time out was conducted at 14:15, prior to the start of the procedure. A Minimum amount of bleeding was controlled with Pressure. The procedure was tolerated well with a pain level of 4 throughout and a pain level of 3 following the procedure. Post Debridement Measurements: 0.9cm length x 1.1cm width x 0.1cm depth; 0.078cm^3 volume. Post debridement Stage noted as  Category/Stage IV. Character of Wound/Ulcer Post Debridement is stable. Post procedure Diagnosis Wound #11: Same as Pre-Procedure General Notes: scribed for Dr. Lady Gary by Zenaida Deed, RN. Wound #12 Pre-procedure diagnosis of Wound #12 is an Arterial Insufficiency Ulcer located on the Left T Great .Severity of Tissue Pre Debridement is: Fat layer oe exposed. There was a Selective/Open Wound Non-Viable Tissue Debridement with a total area of 1.2 sq cm performed by Duanne Guess, MD. With the following instrument(s): Curette to remove Non-Viable tissue/material. Material removed includes [facility] after achieving pain control using Lidocaine 4% Topical Solution. No specimens were taken. A time out was conducted at 14:15, prior to the start of the procedure. A Minimum amount of bleeding was controlled with Pressure. The procedure was tolerated well with a pain level of 4 throughout and a pain level of 3 following the procedure. Post Debridement Measurements: 1.7cm length x 0.9cm width x 0.1cm depth; 0.12cm^3 volume. Character of Wound/Ulcer Post Debridement is stable. Severity of Tissue Post Debridement is: Fat layer exposed. Post procedure Diagnosis Wound #12: Same as Pre-Procedure General Notes: scribed for Dr. Lady Gary by Zenaida Deed, RN. Wound #13 Pre-procedure diagnosis of Wound #13 is an Arterial Insufficiency Ulcer located on the Left,Dorsal T Great .  Severity of Tissue Pre Debridement is: Fat layer oe exposed. There was a Selective/Open Wound Non-Viable Tissue Debridement with a total area of 0.08 sq cm performed by Duanne Guess, MD. With the following instrument(s): Curette to remove Non-Viable tissue/material. Material removed includes [facility] after achieving pain control using Lidocaine 4% Topical Solution. No specimens were taken. A time out was conducted at 14:15, prior to the start of the procedure. A Minimum amount of bleeding was controlled with Pressure. The procedure was  tolerated well with a pain level of 4 throughout and a pain level of 3 following the procedure. Post Debridement Measurements: 0.2cm length x 0.5cm width x 0.1cm depth; 0.008cm^3 volume. Character of Wound/Ulcer Post Debridement is stable. Severity of Tissue Post Debridement is: Fat layer exposed. Post procedure Diagnosis Wound #13: Same as Pre-Procedure General Notes: scribed for Dr. Lady Gary by Zenaida Deed, RN. Wound #14 Pre-procedure diagnosis of Wound #14 is an Arterial Insufficiency Ulcer located on the Left T Third .Severity of Tissue Pre Debridement is: Fat layer oe exposed. There was a Selective/Open Wound Skin/Epidermis Debridement with a total area of 0.13 sq cm performed by Duanne Guess, MD. With the following instrument(s): Curette to remove Non-Viable tissue/material. Material removed includes Eschar, Slough, and Skin: Epidermis after achieving pain control using Lidocaine 4% T opical Solution. 1 specimen was taken by a Tissue Culture and sent to the lab per facility protocol. A time out was conducted at 14:15, prior to the start of the procedure. A Minimum amount of bleeding was controlled with Pressure. The procedure was tolerated well with a pain level of 4 throughout and a pain level of 3 following the procedure. Post Debridement Measurements: 0.4cm length x 0.4cm width x 0.2cm depth; 0.025cm^3 volume. Character of Wound/Ulcer Post Debridement requires further debridement. Severity of Tissue Post Debridement is: Bone involvement without necrosis. Post procedure Diagnosis Wound #14: Same as Pre-Procedure General Notes: scribed for Dr. Lady Gary by Zenaida Deed, RN. Wound #15 Pre-procedure diagnosis of Wound #15 is an Arterial Insufficiency Ulcer located on the Right,Dorsal T Great .Severity of Tissue Pre Debridement is: Fat oe layer exposed. There was a Selective/Open Wound Non-Viable Tissue Debridement with a total area of 0.07 sq cm performed by Duanne Guess, MD.  With the following instrument(s): Curette to remove Non-Viable tissue/material. Material removed includes Eschar after achieving pain control using Lidocaine 4% Topical Solution. No specimens were taken. A time out was conducted at 14:15, prior to the start of the procedure. A Minimum amount of bleeding was controlled with Pressure. The procedure was tolerated well with a pain level of 4 throughout and a pain level of 3 following the procedure. Post Debridement Measurements: 0.3cm length x 0.3cm width x 0.1cm depth; 0.007cm^3 volume. Character of Wound/Ulcer Post Debridement is stable. Severity of Tissue Post Debridement is: Fat layer exposed. Post procedure Diagnosis Wound #15: Same as Pre-Procedure General Notes: scribed for Dr. Lady Gary by Zenaida Deed, RN. Wound #9 Pre-procedure diagnosis of Wound #9 is a Pressure Ulcer located on the Left Calcaneus . There was a Selective/Open Wound Non-Viable Tissue Debridement with a total area of 0.57 sq cm performed by Duanne Guess, MD. With the following instrument(s): Curette to remove Viable and Non-Viable tissue/material. Material removed includes [facility] after achieving pain control using Lidocaine 4% Topical Solution. No specimens were taken. A time out was conducted at 14:15, prior to the start of the procedure. A Minimum amount of bleeding was controlled with Pressure. The procedure was tolerated well with a pain level of 4  throughout and a pain level of 3 following the procedure. Post Debridement Measurements: 0.9cm length x 0.8cm width x 0.4cm depth; 0.226cm^3 volume. Post debridement Stage noted as Category/Stage II. Character of Wound/Ulcer Post Debridement is stable. Post procedure Diagnosis Wound #9: Same as Pre-Procedure General Notes: scribed for Dr. Lady Gary by Zenaida Deed, RN. Plan Follow-up Appointments: Return Appointment in 1 week. - Dr. Lady Gary - room 3 Tuesday 7/23 @ 1:45 pm Anesthetic: (In clinic) Topical Lidocaine 4% applied  to wound bed Bathing/ Shower/ Hygiene: May shower and wash wound with soap and water. Edema Control - Lymphedema / SCD / Other: Elevate legs to the level of the heart or above for 30 minutes daily and/or when sitting for 3-4 times a day throughout the day. Avoid standing for long periods of time. Patient to wear own compression stockings every day. Additional Orders / Instructions: Follow Nutritious Diet - add in protein shakes every day to diet - recommend premier protein 500 mg x3 a day vitamin C, zinc 30-50 mg per day Home Health: New wound care orders this week; continue Home Health for wound care. May utilize formulary equivalent dressing for wound treatment orders unless otherwise specified. - HH 2x/week for 2 weeks the recertify for wound care Other Home Health Orders/Instructions: [facility] [place], Jakayla [name] ([id number]) [id number] Page 15 of 18 Laboratory ordered were: Anaerobic culture - left 3rd toe The following medication(s) was prescribed: levofloxacin oral 750 mg tablet 1 tab p.o. daily x 7 days starting 03/26/2023 WOUND #10: - T Second Wound Laterality: Left oe Cleanser: Soap and Water 1 x Per Day/30 Days Discharge Instructions: May shower and wash wound with dial antibacterial soap and water prior to dressing change. Cleanser: Wound Cleanser 1 x Per Day/30 Days Discharge Instructions: Cleanse the wound with wound cleanser prior to applying a clean dressing using gauze sponges, not tissue or cotton balls. Prim Dressing: Promogran Prisma Matrix, 4.34 (sq in) (silver collagen) (Generic) 1 x Per Day/30 Days ary Discharge Instructions: Moisten collagen with saline or hydrogel Secondary Dressing: Woven Gauze Sponge, Non-Sterile 4x4 in (Generic) 1 x Per Day/30 Days Discharge Instructions: Apply over primary dressing as directed. Secured With: 68M Medipore H Soft Cloth Surgical T ape, 4 x 10 (in/yd) (Generic) 1 x Per Day/30 Days Discharge Instructions:  Secure with tape as directed. WOUND #11: - T Third Wound Laterality: Right oe Cleanser: Soap and Water 1 x Per Day/30 Days Discharge Instructions: May shower and wash wound with dial antibacterial soap and water prior to dressing change. Cleanser: Wound Cleanser 1 x Per Day/30 Days Discharge Instructions: Cleanse the wound with wound cleanser prior to applying a clean dressing using gauze sponges, not tissue or cotton balls. Prim Dressing: Promogran Prisma Matrix, 4.34 (sq in) (silver collagen) (Generic) 1 x Per Day/30 Days ary Discharge Instructions: Moisten collagen with saline or hydrogel Secondary Dressing: Woven Gauze Sponge, Non-Sterile 4x4 in (Generic) 1 x Per Day/30 Days Discharge Instructions: Apply over primary dressing as directed. Secured With: 68M Medipore H Soft Cloth Surgical T ape, 4 x 10 (in/yd) (Generic) 1 x Per Day/30 Days Discharge Instructions: Secure with tape as directed. WOUND #12: - T Great Wound Laterality: Left oe Cleanser: Soap and Water 1 x Per Day/30 Days Discharge Instructions: May shower and wash wound with dial antibacterial soap and water prior to dressing change. Cleanser: Wound Cleanser 1 x Per Day/30 Days Discharge Instructions: Cleanse the wound with wound cleanser prior to applying a clean dressing using gauze sponges, not tissue or  cotton balls. Prim Dressing: Promogran Prisma Matrix, 4.34 (sq in) (silver collagen) (Generic) 1 x Per Day/30 Days ary Discharge Instructions: Moisten collagen with saline or hydrogel Secondary Dressing: Woven Gauze Sponge, Non-Sterile 4x4 in (Generic) 1 x Per Day/30 Days Discharge Instructions: Apply over primary dressing as directed. Secured With: 67M Medipore H Soft Cloth Surgical T ape, 4 x 10 (in/yd) (Generic) 1 x Per Day/30 Days Discharge Instructions: Secure with tape as directed. WOUND #13: - T Great Wound Laterality: Dorsal, Left oe Cleanser: Soap and Water 1 x Per Day/30 Days Discharge Instructions: May shower  and wash wound with dial antibacterial soap and water prior to dressing change. Cleanser: Wound Cleanser 1 x Per Day/30 Days Discharge Instructions: Cleanse the wound with wound cleanser prior to applying a clean dressing using gauze sponges, not tissue or cotton balls. Prim Dressing: Promogran Prisma Matrix, 4.34 (sq in) (silver collagen) (Generic) 1 x Per Day/30 Days ary Discharge Instructions: Moisten collagen with saline or hydrogel Secondary Dressing: Woven Gauze Sponge, Non-Sterile 4x4 in (Generic) 1 x Per Day/30 Days Discharge Instructions: Apply over primary dressing as directed. Secured With: 67M Medipore H Soft Cloth Surgical T ape, 4 x 10 (in/yd) (Generic) 1 x Per Day/30 Days Discharge Instructions: Secure with tape as directed. WOUND #14: - T Third Wound Laterality: Left oe Cleanser: Soap and Water 1 x Per Day/30 Days Discharge Instructions: May shower and wash wound with dial antibacterial soap and water prior to dressing change. Cleanser: Wound Cleanser 1 x Per Day/30 Days Discharge Instructions: Cleanse the wound with wound cleanser prior to applying a clean dressing using gauze sponges, not tissue or cotton balls. Prim Dressing: Promogran Prisma Matrix, 4.34 (sq in) (silver collagen) (Generic) 1 x Per Day/30 Days ary Discharge Instructions: Moisten collagen with saline or hydrogel Secondary Dressing: Woven Gauze Sponge, Non-Sterile 4x4 in (Generic) 1 x Per Day/30 Days Discharge Instructions: Apply over primary dressing as directed. Secured With: 67M Medipore H Soft Cloth Surgical T ape, 4 x 10 (in/yd) (Generic) 1 x Per Day/30 Days Discharge Instructions: Secure with tape as directed. WOUND #15: - T Great Wound Laterality: Dorsal, Right oe Cleanser: Soap and Water 1 x Per Day/30 Days Discharge Instructions: May shower and wash wound with dial antibacterial soap and water prior to dressing change. Cleanser: Wound Cleanser 1 x Per Day/30 Days Discharge Instructions: Cleanse the  wound with wound cleanser prior to applying a clean dressing using gauze sponges, not tissue or cotton balls. Prim Dressing: Promogran Prisma Matrix, 4.34 (sq in) (silver collagen) (Generic) 1 x Per Day/30 Days ary Discharge Instructions: Moisten collagen with saline or hydrogel Secondary Dressing: Woven Gauze Sponge, Non-Sterile 4x4 in (Generic) 1 x Per Day/30 Days Discharge Instructions: Apply over primary dressing as directed. Secured With: 67M Medipore H Soft Cloth Surgical T ape, 4 x 10 (in/yd) (Generic) 1 x Per Day/30 Days Discharge Instructions: Secure with tape as directed. WOUND #9: - Calcaneus Wound Laterality: Left Cleanser: Soap and Water 1 x Per Day/30 Days Discharge Instructions: May shower and wash wound with dial antibacterial soap and water prior to dressing change. Cleanser: Wound Cleanser 1 x Per Day/30 Days Discharge Instructions: Cleanse the wound with wound cleanser prior to applying a clean dressing using gauze sponges, not tissue or cotton balls. Prim Dressing: Promogran Prisma Matrix, 4.34 (sq in) (silver collagen) (Generic) 1 x Per Day/30 Days ary Discharge Instructions: Moisten collagen with saline or hydrogel Secondary Dressing: ALLEVYN Heel 4 1/2in x 5 1/2in / 10.5cm x 13.5cm (Generic) 1  x Per Day/30 Days Discharge Instructions: Apply over primary dressing as directed. Secondary Dressing: Woven Gauze Sponge, Non-Sterile 4x4 in (Generic) 1 x Per Day/30 Days Discharge Instructions: Apply over primary dressing as directed. Secured With: 66M Medipore H Soft Cloth Surgical T ape, 4 x 10 (in/yd) (Generic) 1 x Per Day/30 Days Discharge Instructions: Secure with tape as directed. 03/26/2023: She has a new wound on the tip of her left third toe. It is black. Neither the patient nor her daughter are aware of how it began. The other wounds are stable. I used a curette to debride slough and eschar from all of her wounds. I also debrided subcutaneous tissue from the heel ulcer.  When I took the eschar off the KILA, GODINA ([id number]) [id number] Page 16 of 18 tip of her left third toe, frank pus oozed out. I took a culture of this. The bone is exposed at the tip of this toe. I sent in a prescription for levofloxacin due to the patient's multiple antibiotic allergies. Once her culture data return, I will tailor antibiotic therapy as indicated. Continue Prisma silver collagen to all sites. Follow-up in 1 week Electronic Signature(s) Signed: 03/26/2023 4:19:51 PM By: Duanne Guess MD FACS Entered By: Duanne Guess on 03/26/2023 16:19:51 -------------------------------------------------------------------------------- HxROS Details Patient Name: Date of Service: [name]. 03/26/2023 1:15 PM Medical Record Number: [id number] Patient Account Number: [id number] Date of Birth/Sex: Treating RN: [birth year] (89 y.o. F) Primary Care Provider: [name] Other Clinician: Referring Provider: Treating Provider/Extender: Claudie Leach Weeks in Treatment: 2 Information Obtained From Patient Caregiver Chart Eyes Medical History: Positive for: Cataracts Hematologic/Lymphatic Medical History: Positive for: Anemia Past Medical History Notes: Thrombocytopenia, Anticoagulant long-term use Respiratory Medical History: Negative for: Chronic Obstructive Pulmonary Disease (COPD) Past Medical History Notes: Pulmonary embolus Cardiovascular Medical History: Positive for: Arrhythmia - a-fib; Congestive Heart Failure; Hypotension; Peripheral Venous Disease Gastrointestinal Medical History: Past Medical History Notes: Hiatal hernia Endocrine Medical History: Past Medical History Notes: Hyperthyroidism, Hypothyroidism Genitourinary Medical History: Past Medical History Notes: CKD stage III Musculoskeletal Medical History: Positive for: 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 Past Medical History  Notes: arthritis Anthis, Regnia [name] ([id number]) [id number] Page 17 of 18 Neurologic Medical History: Positive for: Neuropathy HBO Extended History Items Eyes: Cataracts Immunizations Pneumococcal Vaccine: Received Pneumococcal Vaccination: No Implantable Devices None Hospitalization / Surgery History Type of Hospitalization/Surgery Abdominal aortogram w/lower extremity Amputation toe (Right) Colonoscopy Elbow surgery Laparoscopic hysterectomy Family and Social History Cancer: Yes - Siblings; Diabetes: No; Heart Disease: Yes; Stroke: Yes - Mother; Former smoker - quit 2003; Marital Status - Widowed; Alcohol Use: Never; Drug Use: No History; Caffeine Use: Never; Financial Concerns: No; Food, Clothing or Shelter Needs: No; Support System Lacking: No; Transportation Concerns: No Electronic Signature(s) Signed: 03/26/2023 4:23:46 PM By: Duanne Guess MD FACS Entered By: Duanne Guess on 03/26/2023 16:12:23 -------------------------------------------------------------------------------- SuperBill Details Patient Name: Date of Service: [name]. 03/26/2023 Medical Record Number: [id number] Patient Account Number: [id number] Date of Birth/Sex: Treating RN: Jan 12, 1928 (89 y.o. 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 without evidence of necrosis L97.522 Non-pressure chronic ulcer of other part of left foot with fat layer  exposed I73.9 Peripheral vascular disease, unspecified I50.32 Chronic diastolic (congestive) heart failure N18.32 Chronic  kidney disease, stage 3b I87.2 Venous insufficiency (chronic) (peripheral) Facility Procedures : Klassen, HAT CPT4 Code: [id number] TIE [name] [phone] Description: [id number] - DEBRIDE WOUND 1ST 20 SQ CM OR < ICD-10 Diagnosis Description L89.623 Pressure ulcer of left heel, 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 without evidence of 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 Quantity: 1 osis sis : [id number] - WC PHYS DEBR WO ANESTH 20 SQ CM ICD-10 Diagnosis Description L89.623 Pressure ulcer of left heel, 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 without evidence of necr L97.526 Non-pressure  chronic ulcer of other part of left foot with bone involvement without evidence of 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 Quantity: 1 osis sis Electronic Signature(s) Signed: 03/26/2023 4:22:13 PM By: Duanne Guess MD FACS Entered By: Duanne Guess on 03/26/2023 16:22:13

## 2023-03-31 ENCOUNTER — Encounter (HOSPITAL_BASED_OUTPATIENT_CLINIC_OR_DEPARTMENT_OTHER): Payer: Medicare HMO | Attending: General Surgery | Admitting: General Surgery

## 2023-03-31 DIAGNOSIS — J449 Chronic obstructive pulmonary disease, unspecified: Secondary | ICD-10-CM | POA: Diagnosis not present

## 2023-03-31 DIAGNOSIS — N1832 Chronic kidney disease, stage 3b: Secondary | ICD-10-CM | POA: Insufficient documentation

## 2023-03-31 DIAGNOSIS — I4891 Unspecified atrial fibrillation: Secondary | ICD-10-CM | POA: Insufficient documentation

## 2023-03-31 DIAGNOSIS — Z7901 Long term (current) use of anticoagulants: Secondary | ICD-10-CM | POA: Insufficient documentation

## 2023-03-31 DIAGNOSIS — I130 Hypertensive heart and chronic kidney disease with heart failure and stage 1 through stage 4 chronic kidney disease, or unspecified chronic kidney disease: Secondary | ICD-10-CM | POA: Insufficient documentation

## 2023-03-31 DIAGNOSIS — L89623 Pressure ulcer of left heel, stage 3: Secondary | ICD-10-CM | POA: Diagnosis present

## 2023-03-31 DIAGNOSIS — L97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 without evidence of necrosis: Secondary | ICD-10-CM | POA: Insufficient documentation

## 2023-03-31 DIAGNOSIS — I5032 Chronic diastolic (congestive) heart failure: Secondary | ICD-10-CM | POA: Diagnosis not present

## 2023-03-31 NOTE — Progress Notes (Signed)
[name], [name] ([id number]) [id number] Page 1 of 17 Visit Report for 03/31/2023 Arrival Information Details Patient Name: Date of Service: [name] 03/31/2023 1:45 PM Medical Record Number: [id number] Patient Account Number: [id number] Date of Birth/Sex: Treating RN: [name] (89 y.o. [name] Primary Care [name]: [name] Other Clinician: Referring [name]: Treating [name]/Extender: [name] in Treatment: 3 Visit Information History Since Last Visit Added or deleted any medications: Yes Patient Arrived: Wheel Chair Any new allergies or adverse reactions: No Arrival Time: 13:55 Had a fall or experienced change in No Accompanied By: daughter activities of daily living that may affect Transfer Assistance: None risk of falls: Patient Identification Verified: Yes Signs or symptoms of abuse/neglect since last visito No Secondary Verification Process Completed: Yes Hospitalized since last visit: No Patient Has Alerts: Yes Implantable device outside of the clinic excluding No Patient Alerts: ABI R: 0.48 L: 0.46 2/24 cellular tissue based products placed in the center TBI R: 0.39 L: 0.21 2/24 since last visit: Has Dressing in Place as Prescribed: Yes Pain Present Now: No Electronic Signature(s) Signed: 03/31/2023 3:57:07 PM By: Megan Pulling RN, BSN Entered By: [name] on 03/31/2023 13:56:26 -------------------------------------------------------------------------------- Encounter Discharge Information Details Patient Name: Date of Service: Megan Reap. 03/31/2023 1:45 PM Medical Record Number: [id number] Patient Account Number: [id number] Date of Birth/Sex: Treating RN: [name] (89 y.o. [name] Primary Care [name]: [name] Other Clinician: Referring Dayanne Yiu: Treating Javeon Macmurray/Extender: [name] in Treatment: 3 Encounter Discharge Information  Items Post Procedure Vitals Discharge Condition: Stable Temperature (F): 98.6 Ambulatory Status: Wheelchair Pulse (bpm): 73 Discharge Destination: Home Respiratory Rate (breaths/min): 18 Transportation: Private Auto Blood Pressure (mmHg): 155/78 Accompanied By: daughter Schedule Follow-up Appointment: Yes Clinical Summary of Care: Patient Declined Electronic Signature(s) Signed: 03/31/2023 3:57:07 PM By: Megan Pulling RN, BSN Entered By: [name] on 03/31/2023 15:49:28 [name], [name] ([id number]) [id number] Page 2 of 17 -------------------------------------------------------------------------------- Lower Extremity Assessment Details Patient Name: Date of Service: [name] 03/31/2023 1:45 PM Medical Record Number: [id number] Patient Account Number: [id number] Date of Birth/Sex: Treating RN: [name][birth year] (89 y.o. [name] Primary Care [name]: [name] Other Clinician: Referring [name]: Treating [name]/Extender: [name] in Treatment: 3 Edema Assessment Assessed: [Left: No] [Right: No] Edema: [Left: Yes] [Right: Yes] Calf Left: Right: Point of Measurement: From Medial Instep 35.5 cm 37 cm Ankle Left: Right: Point of Measurement: From Medial Instep 21.5 cm 20.5 cm Vascular Assessment Extremity colors, hair growth, and conditions: Extremity Color: [Left:Hyperpigmented] [Right:Hyperpigmented] Hair Growth on Extremity: [Left:No] [Right:No] Temperature of Extremity: [Left:Warm < 3 seconds] [Right:Warm < 3 seconds] Electronic Signature(s) Signed: 03/31/2023 3:57:07 PM By: Megan Pulling RN, BSN Entered By: [name] on 03/31/2023 14:49:40 -------------------------------------------------------------------------------- Multi Wound Chart Details Patient Name: Date of Service: Megan Reap. 03/31/2023 1:45 PM Medical Record Number: [id number] Patient Account Number: [id number] Date of  Birth/Sex: Treating RN: [name] (89 y.o. F) Primary Care [name]: [name] Other Clinician: Referring [name]: Treating [name]/Extender: [name] in Treatment: 3 Vital Signs Height(in): 64 Pulse(bpm): 73 Weight(lbs): 157 Blood Pressure(mmHg): 155/78 Body Mass Index(BMI): 26.9 Temperature(F): 98.6 Respiratory Rate(breaths/min): 20 [10:Photos:] [[id number] Page 3 of 17] Left T Second oe Right T Third oe Left T Great oe Wound Location: Pressure Injury Pressure Injury Gradually Appeared Wounding Event: Pressure Ulcer Pressure Ulcer Arterial Insufficiency Ulcer Primary Etiology: Cataracts, Anemia, Arrhythmia, Cataracts, Anemia, Arrhythmia, Cataracts, Anemia, Arrhythmia, Comorbid History: Congestive Heart Failure, Congestive  Heart Failure, Congestive Heart Failure, Hypotension, Peripheral Venous Hypotension, Peripheral Venous Hypotension, Peripheral Venous Disease, Osteomyelitis, Neuropathy Disease, Osteomyelitis, Neuropathy Disease, Osteomyelitis, Neuropathy 10/09/2022 10/09/2022 03/17/2023 Date Acquired: 3 3 1  [name] of Treatment: Open Open Open Wound Status: No No No Wound Recurrence: 0.6x0.9x0.1 0.8x0.9x0.1 0.4x0.3x0.1 Measurements L x W x D (cm) 0.424 0.565 0.094 A (cm) : rea 0.042 0.057 0.009 Volume (cm) : 40.00% 20.10% 81.00% % Reduction in A rea: 40.80% 19.70% 81.60% % Reduction in Volume: Category/Stage IV Category/Stage IV Full Thickness Without Exposed Classification: Support Structures Small Medium Medium Exudate A mount: Serosanguineous Serosanguineous Serous Exudate Type: red, brown red, brown amber Exudate Color: Distinct, outline attached Distinct, outline attached Flat and Intact Wound Margin: Large (67-100%) Large (67-100%) Small (1-33%) Granulation A mount: Red Pink Pink Granulation Quality: Small (1-33%) Small (1-33%) Large (67-100%) Necrotic A mount: Adherent Slough Adherent  [employer], Adherent Slough Necrotic Tissue: Fat Layer (Subcutaneous Tissue): Yes Fat Layer (Subcutaneous Tissue): Yes Fat Layer (Subcutaneous Tissue): Yes Exposed Structures: Joint: Yes Joint: Yes Fascia: No Bone: Yes Bone: Yes Tendon: No Fascia: No Fascia: No Muscle: No Tendon: No Tendon: No Joint: No Muscle: No Muscle: No Bone: No Medium (34-66%) Small (1-33%) Large (67-100%) Epithelialization: Debridement - Selective/Open Wound Debridement - Selective/Open Wound Debridement - Selective/Open Wound Debridement: Pre-procedure Verification/Time Out 14:45 14:45 14:45 Taken: Lidocaine 4% Topical Solution Lidocaine 4% Topical Solution Lidocaine 4% Topical Solution Pain Control: [employer] Tissue Debrided: Non-Viable Tissue Non-Viable Tissue Non-Viable Tissue Level: 0.42 0.57 0.09 Debridement A (sq cm): rea Curette Curette Curette Instrument: Minimum Minimum Minimum Bleeding: Pressure Pressure Pressure Hemostasis A chieved: Procedure was tolerated well Procedure was tolerated well Procedure was tolerated well Debridement Treatment Response: 0.6x0.9x0.1 0.8x0.9x0.1 0.4x0.3x0.1 Post Debridement Measurements L x W x D (cm) 0.042 0.057 0.009 Post Debridement Volume: (cm) Category/Stage IV Category/Stage IV N/A Post Debridement Stage: No Abnormalities Noted No Abnormalities Noted No Abnormalities Noted Periwound Skin Texture: Dry/Scaly: Yes Dry/Scaly: Yes No Abnormalities Noted Periwound Skin Moisture: No Abnormalities Noted Rubor: No Erythema: Yes Periwound Skin Color: N/A N/A Circumferential Erythema Location: No Abnormality No Abnormality No Abnormality Temperature: N/A N/A Yes Tenderness on Palpation: Debridement Debridement Debridement Procedures Performed: Wound Number: [id number]  Photos: Left, Dorsal T Great oe Left T Third oe Right, Dorsal T Great oe Wound Location: Gradually Appeared Gradually Appeared Gradually Appeared Wounding  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 Etiology: Cataracts, Anemia, Arrhythmia, Cataracts, Anemia, Arrhythmia, Cataracts, Anemia, Arrhythmia, Comorbid History: Congestive Heart Failure, Congestive Heart Failure, Congestive Heart Failure, Hypotension, Peripheral Venous Hypotension, Peripheral Venous Hypotension, Peripheral Venous Disease, Osteomyelitis, Neuropathy Disease, Osteomyelitis, Neuropathy Disease, Osteomyelitis, Neuropathy 03/24/2023 03/24/2023 03/26/2023 Date Acquired: 0 0 0 [name] of Treatment: Open Open Open Wound Status: No No No Wound Recurrence: 0.4x0.4x0.1 0.4x0.6x0.1 0.5x0.8x0.1 Measurements L x W x D (cm) 0.126 0.188 0.314 A (cm) : rea 0.013 0.019 0.031 Volume (cm) : -59.50% -49.20% -342.30% % Reduction in Area: -62.50% -46.20% -342.90% % Reduction in Volume: [name], [name] ([id number]) [id number] Page 4 of 17 Full Thickness Without Exposed Full Thickness With Exposed Support Full Thickness Without Exposed Classification: Support Structures Structures Support Structures Medium Medium None Present Exudate A mount: Serosanguineous Serosanguineous N/A Exudate Type: red, brown red, brown N/A Exudate Color: N/A Distinct, outline attached Flat and Intact Wound Margin: Small (1-33%) Medium (34-66%) Large (67-100%) Granulation A mount: Red, Pink Red Pink Granulation Quality: Large (67-100%) Medium (34-66%) Small (1-33%) Necrotic A mount: Adherent Slough Adherent [facility] Necrotic Tissue:  Fat Layer (Subcutaneous Tissue): Yes Fat Layer (Subcutaneous Tissue): Yes Fat Layer (Subcutaneous Tissue): Yes Exposed Structures: Bone: Yes Fascia: No Tendon: No Muscle: No Joint: No Bone: No None None Small (1-33%) Epithelialization: N/A Debridement - Selective/Open Wound Debridement - Selective/Open Wound Debridement: Pre-procedure Verification/Time Out N/A 14:45  14:45 Taken: N/A Lidocaine 4% Topical Solution Lidocaine 4% Topical Solution Pain Control: N/A [employer] Tissue Debrided: N/A Non-Viable Tissue Non-Viable Tissue Level: N/A 0.19 0.31 Debridement A (sq cm): rea N/A Curette Curette Instrument: N/A Minimum Minimum Bleeding: N/A Pressure Pressure Hemostasis A chieved: N/A Procedure was tolerated well Procedure was tolerated well Debridement Treatment Response: N/A 0.4x0.6x0.1 0.5x0.8x0.1 Post Debridement Measurements L x W x D (cm) N/A 0.019 0.031 Post Debridement Volume: (cm) N/A N/A N/A Post Debridement Stage: Scarring: Yes No Abnormalities Noted Periwound Skin Texture: Maceration: No No Abnormalities Noted Periwound Skin Moisture: Dry/Scaly: No Hemosiderin Staining: Yes No Abnormalities Noted Periwound Skin Color: N/A N/A N/A Erythema Location: N/A No Abnormality No Abnormality Temperature: N/A N/A Yes Tenderness on Palpation: N/A Debridement Debridement Procedures Performed: Wound Number: 9 N/A N/A Photos: N/A N/A Left Calcaneus N/A N/A Wound Location: Pressure Injury N/A N/A Wounding Event: Pressure Ulcer N/A N/A Primary Etiology: Cataracts, Anemia, Arrhythmia, N/A N/A Comorbid History: Congestive Heart Failure, Hypotension, Peripheral Venous Disease, Osteomyelitis, Neuropathy 10/22/2022 N/A N/A Date Acquired: 3 N/A N/A [name] of Treatment: Open N/A N/A Wound Status: No N/A N/A Wound Recurrence: 0.9x0.7x0.5 N/A N/A Measurements L x W x D (cm) 0.495 N/A N/A A (cm) : rea 0.247 N/A N/A Volume (cm) : -50.00% N/A N/A % Reduction in A rea: -149.50% N/A N/A % Reduction in Volume: Category/Stage II N/A N/A Classification: Medium N/A N/A Exudate A mount: Serosanguineous N/A N/A Exudate Type: red, brown N/A N/A Exudate Color: Distinct, outline attached N/A N/A Wound Margin: Small (1-33%) N/A N/A Granulation A mount: Red N/A N/A Granulation Quality: Large (67-100%) N/A N/A Necrotic A  mount: Adherent Slough N/A N/A Necrotic Tissue: Fat Layer (Subcutaneous Tissue): Yes N/A N/A Exposed Structures: Fascia: No Tendon: No Muscle: No Joint: No Bone: No None N/A N/A Epithelialization: Debridement - Selective/Open Wound N/A N/A Debridement: Pre-procedure Verification/Time Out 14:45 N/A N/A Bender, [name] ([id number]) [id number] Page 5 of 17 Taken: Lidocaine 4% Topical Solution N/A N/A Pain Control: Slough N/A N/A Tissue Debrided: Non-Viable Tissue N/A N/A Level: 0.49 N/A N/A Debridement A (sq cm): rea Curette N/A N/A Instrument: Minimum N/A N/A Bleeding: Pressure N/A N/A Hemostasis A chieved: Procedure was tolerated well N/A N/A Debridement Treatment Response: 0.9x0.7x0.5 N/A N/A Post Debridement Measurements L x W x D (cm) 0.247 N/A N/A Post Debridement Volume: (cm) Category/Stage II N/A N/A Post Debridement Stage: Callus: No N/A N/A Periwound Skin Texture: Dry/Scaly: Yes N/A N/A Periwound Skin Moisture: No Abnormalities Noted N/A N/A Periwound Skin Color: N/A N/A N/A Erythema Location: No Abnormality N/A N/A Temperature: Yes N/A N/A Tenderness on Palpation: Debridement N/A N/A Procedures Performed: Treatment Notes Electronic Signature(s) Signed: 03/31/2023 3:09:10 PM By: Duanne Guess MD FACS Entered By: Duanne Guess on 03/31/2023 15:09:10 -------------------------------------------------------------------------------- Multi-Disciplinary Care Plan Details Patient Name: Date of Service: Megan Reap. 03/31/2023 1:45 PM Medical Record Number: [id number] Patient Account Number: [id number] Date of Birth/Sex: Treating RN: [name] (89 y.o. [name] Primary Care Ailani Governale: [name] Other Clinician: Referring Oliviagrace Crisanti: Treating Olivya Sobol/Extender: Tiajuana Amass in Treatment: 3 Multidisciplinary Care Plan reviewed with physician Active Inactive Abuse / Safety / Falls /  Self Care Management Nursing Diagnoses: Impaired home maintenance  Impaired physical mobility Potential for falls Goals: Patient/caregiver will verbalize/demonstrate measure taken to improve self care Date Initiated: 03/10/2023 Target Resolution Date: 05/15/2023 Goal Status: Active Patient/caregiver will verbalize/demonstrate measures taken to improve the patient's personal safety Date Initiated: 03/10/2023 Target Resolution Date: 05/15/2023 Goal Status: Active Interventions: Assess fall risk on admission and as needed Assess: immobility, friction, shearing, incontinence upon admission and as needed Provide education on fall prevention Notes: Wound/Skin Impairment Nursing Diagnoses: Impaired tissue integrity Knowledge deficit related to ulceration/compromised skin integrity Ken, Nihal Judie Petit ([id number]) [id number] Page 6 of 17 Goals: Patient/caregiver will verbalize understanding of skin care regimen Date Initiated: 03/10/2023 Target Resolution Date: 05/15/2023 Goal Status: Active Interventions: Assess patient/caregiver ability to obtain necessary supplies Assess patient/caregiver ability to perform ulcer/skin care regimen upon admission and as needed Assess ulceration(s) every visit Treatment Activities: Skin care regimen initiated : 03/10/2023 Topical wound management initiated : 03/10/2023 Notes: Electronic Signature(s) Signed: 03/31/2023 3:57:07 PM By: Megan Pulling RN, BSN Entered By: [name] on 03/31/2023 14:49:53 -------------------------------------------------------------------------------- Pain Assessment Details Patient Name: Date of Service: Megan Reap. 03/31/2023 1:45 PM Medical Record Number: [id number] Patient Account Number: [id number] Date of Birth/Sex: Treating RN: [name][birth year] (89 y.o. [name] Primary Care Ayomide Zuleta: [name] Other Clinician: Referring Canaan Holzer: Treating Madisin Hasan/Extender: [name] in Treatment: 3 Active Problems Location of Pain Severity and Description of Pain Patient Has Paino No Site Locations Pain Management and Medication Current Pain Management: Electronic Signature(s) Signed: 03/31/2023 3:57:07 PM By: Megan Pulling RN, BSN Entered By: [name] on 03/31/2023 13:57:18 Swoyer, [name] ([id number]) [id number] Page 7 of 17 -------------------------------------------------------------------------------- Patient/Caregiver Education Details Patient Name: Date of Service: ALFIE, ALDERFER 7/23/2024andnbsp1:45 PM Medical Record Number: [id number] Patient Account Number: [id number] Date of Birth/Gender: Treating RN: [name] (89 y.o. [name] Primary Care Physician: [name] Other Clinician: Referring Physician: Treating Physician/Extender: Tiajuana Amass in Treatment: 3 Education Assessment Education Provided To: Patient Education Topics Provided Wound/Skin Impairment: Methods: Explain/Verbal Responses: State content correctly Electronic Signature(s) Signed: 03/31/2023 3:57:07 PM By: Megan Pulling RN, BSN Entered By: [name] on 03/31/2023 14:50:28 -------------------------------------------------------------------------------- Wound Assessment Details Patient Name: Date of Service: Megan Reap. 03/31/2023 1:45 PM Medical Record Number: [id number] Patient Account Number: [id number] Date of Birth/Sex: Treating RN: [birth year] (89 y.o. [name] Primary Care Harlym Gehling: [name] Other Clinician: Referring Rob Mciver: Treating Jilliam Bellmore/Extender: [name] in Treatment: 3 Wound Status Wound Number: 10 Primary Pressure Ulcer Etiology: Wound Location: Left T Second oe Wound Open Wounding Event: Pressure Injury Status: Date Acquired: 10/09/2022 Comorbid Cataracts, Anemia, Arrhythmia, Congestive Heart Failure, [name] Of Treatment:  3 History: Hypotension, Peripheral Venous Disease, Osteomyelitis, Clustered Wound: No Neuropathy Photos Wound Measurements Length: (cm) 0.6 Width: (cm) 0.9 Harland, Crissie [name] ([id number]) Depth: (cm) Area: (cm) Volume: (cm) % Reduction in Area: 40% % Reduction in Volume: 40.8% [id number] Page 8 of 17 0.1 Epithelialization: Medium (34-66%) 0.424 Tunneling: No 0.042 Undermining: No Wound Description Classification: Category/Stage IV Wound Margin: Distinct, outline attached Exudate Amount: Small Exudate Type: Serosanguineous Exudate Color: red, brown Foul Odor After Cleansing: No Slough/Fibrino No Wound Bed Granulation Amount: Large (67-100%) Exposed Structure Granulation Quality: Red Fascia Exposed: No Necrotic Amount: Small (1-33%) Fat Layer (Subcutaneous Tissue) Exposed: Yes Necrotic Quality: Adherent Slough Tendon Exposed: No Muscle Exposed: No Joint Exposed: Yes Bone Exposed: Yes Periwound Skin Texture Texture Color No Abnormalities Noted: Yes No Abnormalities Noted: Yes Moisture Temperature / Pain No Abnormalities Noted: Yes Temperature: No  Abnormality Treatment Notes Wound #10 (Toe Second) Wound Laterality: Left Cleanser Soap and Water Discharge Instruction: May shower and wash wound with dial antibacterial soap and water prior to dressing change. Wound Cleanser Discharge Instruction: Cleanse the wound with wound cleanser prior to applying a clean dressing using gauze sponges, not tissue or cotton balls. Peri-Wound Care Topical Primary Dressing Promogran Prisma Matrix, 4.34 (sq in) (silver collagen) Discharge Instruction: Moisten collagen with saline or hydrogel Secondary Dressing Woven Gauze Sponge, Non-Sterile 4x4 in Discharge Instruction: Apply over primary dressing as directed. Secured With 49M Medipore H Soft Cloth Surgical T ape, 4 x 10 (in/yd) Discharge Instruction: Secure with tape as directed. Compression Wrap Compression  Stockings Add-Ons Electronic Signature(s) Signed: 03/31/2023 3:57:07 PM By: Megan Pulling RN, BSN Entered By: [name] on 03/31/2023 14:07:18 -------------------------------------------------------------------------------- Wound Assessment Details Patient Name: Date of Service: Megan Reap. 03/31/2023 1:45 PM Medical Record Number: [id number] Patient Account Number: [id number] Date of Birth/Sex: Treating RN: [birth year][name] (89 y.o. [address], [address] [place], Melysa Judie Petit ([id number]) [id number].pdf Page 9 of 17 Primary Care Jaselyn Nahm: [name] Other Clinician: Referring Miarose Lippert: Treating Evaleigh Mccamy/Extender: [name] in Treatment: 3 Wound Status Wound Number: 11 Primary Pressure Ulcer Etiology: Wound Location: Right T Third oe Wound Open Wounding Event: Pressure Injury Status: Date Acquired: 10/09/2022 Comorbid Cataracts, Anemia, Arrhythmia, Congestive Heart Failure, [name] Of Treatment: 3 History: Hypotension, Peripheral Venous Disease, Osteomyelitis, Clustered Wound: No Neuropathy Photos Wound Measurements Length: (cm) 0.8 Width: (cm) 0.9 Depth: (cm) 0.1 Area: (cm) 0.565 Volume: (cm) 0.057 % Reduction in Area: 20.1% % Reduction in Volume: 19.7% Epithelialization: Small (1-33%) Tunneling: No Undermining: No Wound Description Classification: Category/Stage IV Wound Margin: Distinct, outline attached Exudate Amount: Medium Exudate Type: Serosanguineous Exudate Color: red, brown Foul Odor After Cleansing: No Slough/Fibrino Yes Wound Bed Granulation Amount: Large (67-100%) Exposed Structure Granulation Quality: Pink Fascia Exposed: No Necrotic Amount: Small (1-33%) Fat Layer (Subcutaneous Tissue) Exposed: Yes Necrotic Quality: Adherent Slough Tendon Exposed: No Muscle Exposed: No Joint Exposed: Yes Bone Exposed: Yes Periwound Skin Texture Texture Color No Abnormalities Noted: Yes No Abnormalities Noted:  Yes Moisture Temperature / Pain No Abnormalities Noted: Yes Temperature: No Abnormality Treatment Notes Wound #11 (Toe Third) Wound Laterality: Right Cleanser Soap and Water Discharge Instruction: May shower and wash wound with dial antibacterial soap and water prior to dressing change. Wound Cleanser Discharge Instruction: Cleanse the wound with wound cleanser prior to applying a clean dressing using gauze sponges, not tissue or cotton balls. Peri-Wound Care Topical Primary Dressing Promogran Prisma Matrix, 4.34 (sq in) (silver collagen) Discharge Instruction: Moisten collagen with saline or hydrogel [name], [name] ([id number]) [id number] Page 10 of 17 Secondary Dressing Woven Gauze Sponge, Non-Sterile 4x4 in Discharge Instruction: Apply over primary dressing as directed. Secured With 49M Medipore H Soft Cloth Surgical T ape, 4 x 10 (in/yd) Discharge Instruction: Secure with tape as directed. Compression Wrap Compression Stockings Add-Ons Electronic Signature(s) Signed: 03/31/2023 3:57:07 PM By: Megan Pulling RN, BSN Entered By: [name] on 03/31/2023 14:09:07 -------------------------------------------------------------------------------- Wound Assessment Details Patient Name: Date of Service: Megan Reap. 03/31/2023 1:45 PM Medical Record Number: [id number] Patient Account Number: [id number] Date of Birth/Sex: Treating RN: [birth year] [name] (89 y.o. 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 T Great oe Wound Open Wounding Event: Gradually Appeared Status: Date Acquired: 03/17/2023 Comorbid Cataracts, Anemia,  Arrhythmia, Congestive Heart Failure, [name] Of Treatment: 1 History: Hypotension, Peripheral Venous Disease,  Osteomyelitis, Clustered Wound: No Neuropathy Photos Wound Measurements Length: (cm) 0.4 Width: (cm) 0.3 Depth: (cm) 0.1 Area: (cm) 0.094 Volume: (cm) 0.009 % Reduction in Area: 81% % Reduction in Volume: 81.6% Epithelialization: Large (67-100%) Tunneling: No Undermining: No Wound Description Classification: Full Thickness Without Exposed Suppo Wound Margin: Flat and Intact Exudate Amount: Medium Exudate Type: Serous Exudate Color: amber rt Structures Foul Odor After Cleansing: No Slough/Fibrino Yes Wound Bed Granulation Amount: Small (1-33%) Exposed Structure Granulation Quality: Pink Fascia Exposed: No Saindon, Anelisse [name] ([id number]) [id number] Page 11 of 17 Necrotic Amount: Large (67-100%) Fat Layer (Subcutaneous Tissue) Exposed: Yes Necrotic Quality: Eschar, Adherent Slough Tendon Exposed: No Muscle Exposed: No Joint Exposed: No Bone Exposed: No Periwound Skin Texture Texture Color No Abnormalities Noted: Yes No Abnormalities Noted: No Erythema: Yes Moisture Erythema Location: Circumferential No Abnormalities Noted: Yes Temperature / Pain Temperature: No Abnormality Tenderness on Palpation: Yes Treatment Notes Wound #12 (Toe Great) Wound Laterality: Left Cleanser Soap and Water Discharge Instruction: May shower and wash wound with dial antibacterial soap and water prior to dressing change. Wound Cleanser Discharge Instruction: Cleanse the wound with wound cleanser prior to applying a clean dressing using gauze sponges, not tissue or cotton balls. Peri-Wound Care Topical Primary Dressing Promogran Prisma Matrix, 4.34 (sq in) (silver collagen) Discharge Instruction: Moisten collagen with saline or hydrogel Secondary Dressing Woven Gauze Sponge, Non-Sterile 4x4 in Discharge Instruction: Apply over primary dressing as directed. Secured With 74M Medipore H Soft Cloth Surgical T ape, 4 x 10 (in/yd) Discharge Instruction: Secure with  tape as directed. Compression Wrap Compression Stockings Add-Ons Electronic Signature(s) Signed: 03/31/2023 3:57:07 PM By: Megan Pulling RN, BSN Entered By: [name] on 03/31/2023 14:13:31 -------------------------------------------------------------------------------- Wound Assessment Details Patient Name: Date of Service: Megan Reap. 03/31/2023 1:45 PM Medical Record Number: [id number] Patient Account Number: [id number] Date of Birth/Sex: Treating RN: [name] (89 y.o. 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, Dorsal T Great oe Wound Open Wounding Event: Gradually Appeared Status: Date Acquired: 03/24/2023 Comorbid Cataracts, Anemia, Arrhythmia, Congestive Heart Failure, [name] Of Treatment: 0 History: Hypotension, Peripheral Venous Disease, Osteomyelitis, Clustered Wound: No Neuropathy [name], [name] ([id number]) [id number] Page 12 of 17 Photos Wound Measurements Length: (cm) 0.4 Width: (cm) 0.4 Depth: (cm) 0.1 Area: (cm) 0.126 Volume: (cm) 0.013 % Reduction in Area: -59.5% % Reduction in Volume: -62.5% Epithelialization: None Tunneling: No Undermining: No Wound Description Classification: Full Thickness Without Exposed Support Structures Exudate Amount: Medium Exudate Type: Serosanguineous Exudate Color: red, brown Foul Odor After Cleansing: No Slough/Fibrino Yes Wound Bed Granulation Amount: Small (1-33%) Exposed Structure Granulation Quality: Red, Pink Fat Layer (Subcutaneous Tissue) Exposed: Yes Necrotic Amount: Large (67-100%) Necrotic Quality: Adherent Slough Periwound Skin Texture Texture Color No Abnormalities Noted: No No Abnormalities Noted: No Moisture No Abnormalities Noted:  No Treatment Notes Wound #13 (Toe Great) Wound Laterality: Dorsal, Left Cleanser Soap and Water Discharge Instruction: May shower and wash wound with dial antibacterial soap and water prior to dressing change. Wound Cleanser Discharge Instruction: Cleanse the wound with wound cleanser prior to applying a clean dressing using gauze sponges, not tissue or cotton balls. Peri-Wound Care Topical Primary Dressing Promogran Prisma Matrix, 4.34 (sq in) (silver collagen) Discharge Instruction: Moisten collagen with saline or hydrogel Secondary Dressing Woven Gauze  Sponge, Non-Sterile 4x4 in Discharge Instruction: Apply over primary dressing as directed. Secured With 110M Medipore H Soft Cloth Surgical T ape, 4 x 10 (in/yd) Discharge Instruction: Secure with tape as directed. Compression Wrap Compression Stockings Add-Ons Electronic Signature(s) Signed: 03/31/2023 3:57:07 PM By: Megan Pulling RN, BSN [name], [name] ([id number]) [id number] Page 13 of 17 Signed: 03/31/2023 3:57:07 PM By: Megan Pulling RN, BSN Entered By: [name] on 03/31/2023 14:12:08 -------------------------------------------------------------------------------- Wound Assessment Details Patient Name: Date of Service: Megan Reap. 03/31/2023 1:45 PM Medical Record Number: [id number] Patient Account Number: [id number] Date of Birth/Sex: Treating RN: [name] (89 y.o. 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 T Third oe Wound Open Wounding Event: Gradually Appeared Status: Date Acquired: 03/24/2023 Comorbid Cataracts, Anemia, Arrhythmia, Congestive Heart Failure, [name] Of Treatment: 0 History: Hypotension, Peripheral Venous Disease,  Osteomyelitis, Clustered Wound: No Neuropathy Photos Wound Measurements Length: (cm) 0.4 Width: (cm) 0.6 Depth: (cm) 0.1 Area: (cm) 0.188 Volume: (cm) 0.019 % Reduction in Area: -49.2% % Reduction in Volume: -46.2% Epithelialization: None Tunneling: No Undermining: No Wound Description Classification: Full Thickness With Exposed Sup Wound Margin: Distinct, outline attached Exudate Amount: Medium Exudate Type: Serosanguineous Exudate Color: red, brown port Structures Foul Odor After Cleansing: No Slough/Fibrino Yes Wound Bed Granulation Amount: Medium (34-66%) Exposed Structure Granulation Quality: Red Fat Layer (Subcutaneous Tissue) Exposed: Yes Necrotic Amount: Medium (34-66%) Bone Exposed: Yes Necrotic Quality: Adherent Slough Periwound Skin Texture Texture Color No Abnormalities Noted: No No Abnormalities Noted: No Scarring: Yes Hemosiderin Staining: Yes Moisture Temperature / Pain No Abnormalities Noted: No Temperature: No Abnormality Dry / Scaly: No Maceration: No Treatment Notes [name], [name] ([id number]) [id number] Page 14 of 17 Wound #14 (Toe Third) Wound Laterality: Left Cleanser Soap and Water Discharge Instruction: May shower and wash wound with dial antibacterial soap and water prior to dressing change. Wound Cleanser Discharge Instruction: Cleanse the wound with wound cleanser prior to applying a clean dressing using gauze sponges, not tissue or cotton balls. Peri-Wound Care Topical Primary Dressing Promogran Prisma Matrix, 4.34 (sq in) (silver collagen) Discharge Instruction: Moisten collagen with saline or hydrogel Secondary Dressing Woven Gauze Sponge, Non-Sterile 4x4 in Discharge Instruction: Apply over primary dressing as directed. Secured With 110M Medipore H Soft Cloth Surgical T ape, 4 x 10 (in/yd) Discharge Instruction: Secure with tape as directed. Compression Wrap Compression Stockings Add-Ons Electronic  Signature(s) Signed: 03/31/2023 3:57:07 PM By: Megan Pulling RN, BSN Entered By: [name] on 03/31/2023 14:51:27 -------------------------------------------------------------------------------- Wound Assessment Details Patient Name: Date of Service: Megan Reap. 03/31/2023 1:45 PM Medical Record Number: [id number] Patient Account Number: [id number] Date of Birth/Sex: Treating RN: [name] (89 y.o. [name] Primary Care Aleya Durnell: [name] Other Clinician: Referring Quanasia Defino: Treating Ember Gottwald/Extender: [name] in Treatment: 3 Wound Status Wound Number: 15 Primary Arterial Insufficiency Ulcer Etiology: Wound Location: Right, Dorsal T Great oe Wound Open Wounding Event: Gradually Appeared Status: Date Acquired: 03/26/2023 Comorbid Cataracts, Anemia, Arrhythmia, Congestive Heart Failure, [name] Of Treatment: 0 History: Hypotension, Peripheral Venous Disease, Osteomyelitis, Clustered Wound: No Neuropathy Photos Wound Measurements Length: (cm) 0.5 [name], [name] ([id number]) Width: (cm) 0.8 Depth: (cm) 0.1 Area: (cm) 0.314 Volume: (cm) 0.031 % Reduction in Area: -342.3% [phone].pdf Page 15 of 17 % Reduction in Volume: -342.9% Epithelialization: Small (1-33%) Tunneling: No Undermining: No Wound Description Classification: Full  Thickness Without Exposed Support Structures Wound Margin: Flat and Intact Exudate Amount: None Present Foul Odor After Cleansing: No Slough/Fibrino No Wound Bed Granulation Amount: Large (67-100%) Exposed Structure Granulation Quality: Pink Fascia Exposed: No Necrotic Amount: Small (1-33%) Fat Layer (Subcutaneous Tissue) Exposed: Yes Necrotic Quality: Adherent Slough Tendon Exposed: No Muscle Exposed: No Joint Exposed: No Bone Exposed: No Periwound Skin Texture Texture Color No Abnormalities Noted: Yes No Abnormalities Noted: Yes Moisture Temperature / Pain No  Abnormalities Noted: Yes Temperature: No Abnormality Tenderness on Palpation: Yes Treatment Notes Wound #15 (Toe Great) Wound Laterality: Dorsal, Right Cleanser Soap and Water Discharge Instruction: May shower and wash wound with dial antibacterial soap and water prior to dressing change. Wound Cleanser Discharge Instruction: Cleanse the wound with wound cleanser prior to applying a clean dressing using gauze sponges, not tissue or cotton balls. Peri-Wound Care Topical Primary Dressing Promogran Prisma Matrix, 4.34 (sq in) (silver collagen) Discharge Instruction: Moisten collagen with saline or hydrogel Secondary Dressing Woven Gauze Sponge, Non-Sterile 4x4 in Discharge Instruction: Apply over primary dressing as directed. Secured With 26M Medipore H Soft Cloth Surgical T ape, 4 x 10 (in/yd) Discharge Instruction: Secure with tape as directed. Compression Wrap Compression Stockings Add-Ons Electronic Signature(s) Signed: 03/31/2023 3:57:07 PM By: Megan Pulling RN, BSN Entered By: [name] on 03/31/2023 14:16:14 -------------------------------------------------------------------------------- Wound Assessment Details Patient Name: Date of Service: Megan Reap. 03/31/2023 1:45 PM Medical Record Number: [id number] Patient Account Number: [id number] [name], [name] ([id number]) [phone].pdf Page 16 of 17 Date of Birth/Sex: Treating RN: [name] (89 y.o. 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 Wound Open Wounding Event: Pressure Injury Status: Date Acquired: 10/22/2022 Comorbid Cataracts, Anemia, Arrhythmia, Congestive Heart Failure, [name] Of Treatment: 3 History: Hypotension, Peripheral Venous Disease,  Osteomyelitis, Clustered Wound: No Neuropathy Photos Wound Measurements Length: (cm) 0.9 Width: (cm) 0.7 Depth: (cm) 0.5 Area: (cm) 0.495 Volume: (cm) 0.247 % Reduction in Area: -50% % Reduction in Volume: -149.5% Epithelialization: None Tunneling: No Undermining: No Wound Description Classification: Category/Stage II Wound Margin: Distinct, outline attached Exudate Amount: Medium Exudate Type: Serosanguineous Exudate Color: red, brown Foul Odor After Cleansing: No Slough/Fibrino Yes Wound Bed Granulation Amount: Small (1-33%) Exposed Structure Granulation Quality: Red Fascia Exposed: No Necrotic Amount: Large (67-100%) Fat Layer (Subcutaneous Tissue) Exposed: Yes Necrotic Quality: Adherent Slough Tendon Exposed: No Muscle Exposed: No Joint Exposed: No Bone Exposed: No Periwound Skin Texture Texture Color No Abnormalities Noted: Yes No Abnormalities Noted: Yes Moisture Temperature / Pain No Abnormalities Noted: No Temperature: No Abnormality Dry / Scaly: Yes Tenderness on Palpation: Yes Treatment Notes Wound #9 (Calcaneus) Wound Laterality: Left Cleanser Soap and Water Discharge Instruction: May shower and wash wound with dial antibacterial soap and water prior to dressing change. Wound Cleanser Discharge Instruction: Cleanse the wound with wound cleanser prior to applying a clean dressing using gauze sponges, not tissue or cotton balls. Peri-Wound Care Topical Primary Dressing Promogran Prisma Matrix, 4.34 (sq in) (silver collagen) [name], [name] ([id number]) [id number] Page 17 of 17 Discharge Instruction: Moisten collagen with saline or hydrogel Secondary Dressing ALLEVYN Heel 4 1/2in x 5 1/2in / 10.5cm x 13.5cm Discharge Instruction: Apply over primary dressing as directed. Woven Gauze Sponge, Non-Sterile 4x4 in Discharge Instruction: Apply over primary dressing as directed. Secured With 26M Medipore H Soft Cloth Surgical T ape,  4 x 10 (in/yd)  Discharge Instruction: Secure with tape as directed. Compression Wrap Compression Stockings Add-Ons Electronic Signature(s) Signed: 03/31/2023 3:57:07 PM By: Megan Pulling RN, BSN Entered By: [name] on 03/31/2023 14:17:50 -------------------------------------------------------------------------------- Vitals Details Patient Name: Date of Service: Megan Reap. 03/31/2023 1:45 PM Medical Record Number: [id number] Patient Account Number: [id number] Date of Birth/Sex: Treating RN: [birth year] (89 y.o. [name] Primary Care Dwayn Moravek: [name] Other Clinician: Referring Seabron Iannello: Treating Efrat Zuidema/Extender: [name] in Treatment: 3 Vital Signs Time Taken: 13:50 Temperature (F): 98.6 Height (in): 64 Pulse (bpm): 73 Weight (lbs): 157 Respiratory Rate (breaths/min): 20 Body Mass Index (BMI): 26.9 Blood Pressure (mmHg): 155/78 Reference Range: 80 - 120 mg / dl Electronic Signature(s) Signed: 03/31/2023 3:57:07 PM By: Megan Pulling RN, BSN Entered By: [name] on 03/31/2023 13:57:07

## 2023-03-31 NOTE — Progress Notes (Signed)
UNDINE, NEALIS ([id number]) [id number] Page 1 of 18 Visit Report for 03/31/2023 Chief Complaint Document Details Patient Name: Date of Service: [name], [name] 03/31/2023 1:45 PM Medical Record Number: [id number] Patient Account Number: [id number] Date of Birth/Sex: Treating RN: 1928/07/28 (89 y.o. F) Primary Care Provider: [name] Other Clinician: Referring Provider: Treating Provider/Extender: Claudie Leach Weeks in Treatment: 3 Information Obtained from: Patient Chief Complaint 01/28/2022; right second toe amputation site dehiscence and bilateral lower extremity wounds. 03/10/2023: pressure ulcers of right heel, ulcers on toes Electronic Signature(s) Signed: 03/31/2023 3:09:24 PM By: Duanne Guess MD FACS Entered By: Duanne Guess on 03/31/2023 15:09:24 -------------------------------------------------------------------------------- Debridement Details Patient Name: Date of Service: [name]. 03/31/2023 1:45 PM Medical Record Number: [id number] Patient Account Number: [id number] Date of Birth/Sex: Treating RN: Jan 13, 1928 (89 y.o. [name] Primary Care Provider: [name] Other Clinician: Referring Provider: Treating Provider/Extender: Claudie Leach Weeks in Treatment: 3 Debridement Performed for Assessment: Wound #10 Left T Second oe Performed By: Physician Duanne Guess, MD Debridement Type: Debridement Level of Consciousness (Pre-procedure): Awake and Alert Pre-procedure Verification/Time Out Yes - 14:45 Taken: Start Time: 14:50 Pain Control: Lidocaine 4% T opical Solution Percent of Wound Bed Debrided: 100% T Area Debrided (cm): otal 0.42 Tissue and other material debrided: Non-Viable, Slough, Slough Level: Non-Viable Tissue Debridement Description: Selective/Open Wound Instrument: Curette Bleeding: Minimum Hemostasis Achieved: Pressure Response to Treatment: Procedure was  tolerated well Level of Consciousness (Post- Awake and Alert procedure): Post Debridement Measurements of Total Wound Length: (cm) 0.6 Stage: Category/Stage IV Width: (cm) 0.9 Depth: (cm) 0.1 Volume: (cm) 0.042 Character of Wound/Ulcer Post Debridement: Improved Post Procedure Diagnosis Curfman, Onnika M ([id number]) [id number] Page 2 of 18 Same as Pre-procedure Notes Scribed for Dr Lady Gary by Redmond Pulling, Rn Electronic Signature(s) Signed: 03/31/2023 3:57:07 PM By: Redmond Pulling RN, BSN Signed: 03/31/2023 4:18:25 PM By: Duanne Guess MD FACS Entered By: Redmond Pulling on 03/31/2023 14:52:52 -------------------------------------------------------------------------------- Debridement Details Patient Name: Date of Service: [name]. 03/31/2023 1:45 PM Medical Record Number: [id number] Patient Account Number: [id number] Date of Birth/Sex: Treating RN: Mar 13, 1928 (89 y.o. [name] Primary Care Provider: [name] Other Clinician: Referring Provider: Treating Provider/Extender: Claudie Leach Weeks in Treatment: 3 Debridement Performed for Assessment: Wound #12 Left T Great oe Performed By: Physician Duanne Guess, MD Debridement Type: Debridement Severity of Tissue Pre Debridement: Fat layer exposed Level of Consciousness (Pre-procedure): Awake and Alert Pre-procedure Verification/Time Out Yes - 14:45 Taken: Start Time: 14:50 Pain Control: Lidocaine 4% T opical Solution Percent of Wound Bed Debrided: 100% T Area Debrided (cm): otal 0.09 Tissue and other material debrided: Non-Viable, Slough, Slough Level: Non-Viable Tissue Debridement Description: Selective/Open Wound Instrument: Curette Bleeding: Minimum Hemostasis Achieved: Pressure Response to Treatment: Procedure was tolerated well Level of Consciousness (Post- Awake and Alert procedure): Post Debridement Measurements of Total Wound Length: (cm)  0.4 Width: (cm) 0.3 Depth: (cm) 0.1 Volume: (cm) 0.009 Character of Wound/Ulcer Post Debridement: Improved Severity of Tissue Post Debridement: Fat layer exposed Post Procedure Diagnosis Same as Pre-procedure Notes Scribed for Dr Lady Gary by Redmond Pulling, Rn Electronic Signature(s) Signed: 03/31/2023 3:57:07 PM By: Redmond Pulling RN, BSN Signed: 03/31/2023 4:18:25 PM By: Duanne Guess MD FACS Entered By: Redmond Pulling on 03/31/2023 14:53:25 Admire, [name] ([id number]) [id number] Page 3 of 18 -------------------------------------------------------------------------------- Debridement Details Patient Name: Date of Service: [name], [name] 03/31/2023 1:45 PM Medical Record Number: [id number] Patient Account Number: [id number] Date of Birth/Sex: Treating RN: 1928-09-04 (  89 y.o. [name] Primary Care Provider: [name] Other Clinician: Referring Provider: Treating Provider/Extender: Claudie Leach Weeks in Treatment: 3 Debridement Performed for Assessment: Wound #14 Left T Third oe Performed By: Physician Duanne Guess, MD Debridement Type: Debridement Severity of Tissue Pre Debridement: Fat layer exposed Level of Consciousness (Pre-procedure): Awake and Alert Pre-procedure Verification/Time Out Yes - 14:45 Taken: Start Time: 14:50 Pain Control: Lidocaine 4% T opical Solution Percent of Wound Bed Debrided: 100% T Area Debrided (cm): otal 0.19 Tissue and other material debrided: Non-Viable, Slough, Slough Level: Non-Viable Tissue Debridement Description: Selective/Open Wound Instrument: Curette Bleeding: Minimum Hemostasis Achieved: Pressure Response to Treatment: Procedure was tolerated well Level of Consciousness (Post- Awake and Alert procedure): Post Debridement Measurements of Total Wound Length: (cm) 0.4 Width: (cm) 0.6 Depth: (cm) 0.1 Volume: (cm) 0.019 Character of Wound/Ulcer Post Debridement:  Improved Severity of Tissue Post Debridement: Fat layer exposed Post Procedure Diagnosis Same as Pre-procedure Notes Scribed for Dr Lady Gary by Redmond Pulling, Rn Electronic Signature(s) Signed: 03/31/2023 3:57:07 PM By: Redmond Pulling RN, BSN Signed: 03/31/2023 4:18:25 PM By: Duanne Guess MD FACS Entered By: Redmond Pulling on 03/31/2023 14:55:01 -------------------------------------------------------------------------------- Debridement Details Patient Name: Date of Service: [name]. 03/31/2023 1:45 PM Medical Record Number: [id number] Patient Account Number: [id number] Date of Birth/Sex: Treating RN: 1927-11-27 (89 y.o. [name] Primary Care Provider: [name] Other Clinician: Referring Provider: Treating Provider/Extender: Claudie Leach Weeks in Treatment: 3 Debridement Performed for Assessment: Wound #9 Left Calcaneus Performed By: Physician Duanne Guess, MD Debridement Type: Debridement Level of Consciousness (Pre-procedure): Awake and Alert Pre-procedure Verification/Time Out Yes - 14:45 Taken: Start Time: 14:50 Pain Control: Lidocaine 4% T opical Solution Percent of Wound Bed Debrided: 100% T Area Debrided (cm): otal 0.49 Tissue and other material debrided: Non-Viable, [address] [place], [place] ([id number]) [phone].pdf Page 4 of 18 Level: Non-Viable Tissue Debridement Description: Selective/Open Wound Instrument: Curette Bleeding: Minimum Hemostasis Achieved: Pressure Response to Treatment: Procedure was tolerated well Level of Consciousness (Post- Awake and Alert procedure): Post Debridement Measurements of Total Wound Length: (cm) 0.9 Stage: Category/Stage II Width: (cm) 0.7 Depth: (cm) 0.5 Volume: (cm) 0.247 Character of Wound/Ulcer Post Debridement: Improved Post Procedure Diagnosis Same as Pre-procedure Notes Scribed for Dr Lady Gary by Redmond Pulling, Rn Electronic Signature(s) Signed:  03/31/2023 3:57:07 PM By: Redmond Pulling RN, BSN Signed: 03/31/2023 4:18:25 PM By: Duanne Guess MD FACS Entered By: Redmond Pulling on 03/31/2023 14:55:31 -------------------------------------------------------------------------------- Debridement Details Patient Name: Date of Service: [name]. 03/31/2023 1:45 PM Medical Record Number: [id number] Patient Account Number: [id number] Date of Birth/Sex: Treating RN: Apr 18, 1928 (89 y.o. [name] Primary Care Provider: [name] Other Clinician: Referring Provider: Treating Provider/Extender: Claudie Leach Weeks in Treatment: 3 Debridement Performed for Assessment: Wound #11 Right T Third oe Performed By: Physician Duanne Guess, MD Debridement Type: Debridement Level of Consciousness (Pre-procedure): Awake and Alert Pre-procedure Verification/Time Out Yes - 14:45 Taken: Start Time: 14:50 Pain Control: Lidocaine 4% T opical Solution Percent of Wound Bed Debrided: 100% T Area Debrided (cm): otal 0.57 Tissue and other material debrided: Non-Viable, Slough, Slough Level: Non-Viable Tissue Debridement Description: Selective/Open Wound Instrument: Curette Bleeding: Minimum Hemostasis Achieved: Pressure Response to Treatment: Procedure was tolerated well Level of Consciousness (Post- Awake and Alert procedure): Post Debridement Measurements of Total Wound Length: (cm) 0.8 Stage: Category/Stage IV Width: (cm) 0.9 Depth: (cm) 0.1 Volume: (cm) 0.057 Character of Wound/Ulcer Post Debridement: Improved Post Procedure Diagnosis Same as Pre-procedure Notes  ADDELYNN, BATTE ([id number]) [id number] Page 5 of 18 Scribed for Dr Lady Gary by Redmond Pulling, Rn Electronic Signature(s) Signed: 03/31/2023 3:57:07 PM By: Redmond Pulling RN, BSN Signed: 03/31/2023 4:18:25 PM By: Duanne Guess MD FACS Entered By: Redmond Pulling on 03/31/2023  14:56:02 -------------------------------------------------------------------------------- Debridement Details Patient Name: Date of Service: [name]. 03/31/2023 1:45 PM Medical Record Number: [id number] Patient Account Number: [id number] Date of Birth/Sex: Treating RN: 1928/07/13 (89 y.o. [name] Primary Care Provider: [name] Other Clinician: Referring Provider: Treating Provider/Extender: Claudie Leach Weeks in Treatment: 3 Debridement Performed for Assessment: Wound #15 Right,Dorsal T Great oe Performed By: Physician Duanne Guess, MD Debridement Type: Debridement Severity of Tissue Pre Debridement: Fat layer exposed Level of Consciousness (Pre-procedure): Awake and Alert Pre-procedure Verification/Time Out Yes - 14:45 Taken: Start Time: 14:50 Pain Control: Lidocaine 4% T opical Solution Percent of Wound Bed Debrided: 100% T Area Debrided (cm): otal 0.31 Tissue and other material debrided: Non-Viable, Slough, Slough Level: Non-Viable Tissue Debridement Description: Selective/Open Wound Instrument: Curette Bleeding: Minimum Hemostasis Achieved: Pressure Response to Treatment: Procedure was tolerated well Level of Consciousness (Post- Awake and Alert procedure): Post Debridement Measurements of Total Wound Length: (cm) 0.5 Width: (cm) 0.8 Depth: (cm) 0.1 Volume: (cm) 0.031 Character of Wound/Ulcer Post Debridement: Improved Severity of Tissue Post Debridement: Fat layer exposed Post Procedure Diagnosis Same as Pre-procedure Notes Scribed for Dr Lady Gary by Redmond Pulling, Rn Electronic Signature(s) Signed: 03/31/2023 3:57:07 PM By: Redmond Pulling RN, BSN Signed: 03/31/2023 4:18:25 PM By: Duanne Guess MD FACS Entered By: Redmond Pulling on 03/31/2023 14:56:44 -------------------------------------------------------------------------------- HPI Details Patient Name: Date of Service: [name]. 03/31/2023 1:45 PM Medical  Record Number: [id number] Patient Account Number: [id number] Date of Birth/Sex: Treating RN: November 16, 1927 (89 y.o. Archer Vise, [name] ([id number]) [id number] Page 6 of 18 Primary Care Provider: [name] Other Clinician: Referring Provider: Treating Provider/Extender: Claudie Leach Weeks in Treatment: 3 History of Present Illness HPI Description: Admission 01/28/2022 Ms. 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 She states she has tried Medihoney and silver alginate in the past to this area with little benefit. She also has 2 small areas limited to skin breakdown to her lower extremities bilaterally. She has chronic venous insufficiency but not has not been wearing her compression stockings. She states she bumped her legs against an object and not so the wound started. She has been using Medihoney to the sites. She denies signs of infection. 6/2; patient presents for follow-up. She had an x-ray of her right foot done at last clinic visit and this was negative for evidence of osteomyelitis. She also had a wound culture done that showed extra high levels of Staph aureus. I recommended Keystone antibiotics for this and this was ordered. She had ABIs with TBI's done as well that showed monophasic waveforms to the right foot with TBI of 0 and an ABI of 0.52. Urgent referral was made to vein and vascular and she saw Dr. Durwin Nora on 6/1, yesterday and he recommended an arteriogram. This is scheduled for 6/16. Patient also reports a new wound to the right great toe. This is a blister that has ruptured. She also reports increased erythema to the toe. 6/6; the  patient was worked in  urgently today at the insistence of her daughter out of concern for a new wound on the lateral part of the plantar right great toe. She has her original postsurgical wound after the amputation of the right second toe, she has a wound on the medial part of the right great toe. The patient is apparently going for an angiogram by Dr. Durwin Nora in 2 weeks time. 6/13; patient presents for follow-up. She has been using bacitracin to the abrasion on the right great toe. She has been using collagen and Keystone antibiotic to the amputation site. She has no issues or complaints today. She denies signs of infection. 6/22; patient presents for follow-up. She states that her abdominal aortogram was canceled due to her renal function. She has been using Keystone antibiotics to the amputation site and Medihoney to the right great toe wound. At the pace of the right great toe she has a slitlike open area that she thinks was caused by the tape from the dressing. 6/29; patient presents for follow-up. She has been using Keystone antibiotics to the amputation site along with collagen. She has been using Medihoney to the right great toe wound. She has no other wounds. She denies signs of infection. 7/13; patient presents for follow-up. She has been using Keystone antibiotics and collagen to the wound sites. She currently denies signs of infection. She states she is scheduled to see me nephrology next month. 7/20; patient presents for follow-up. She continue Keystone antibiotics and collagen to the wound sites. She has no issues or complaints today. 8/1; patient presents for follow up. She continues to use keystone antibiotics and collagen to the wound sites. She has no issues or complaints today. 8/15; patient presents for follow-up. She has been using Keystone antibiotics and collagen to the wound sites. She followed up with her nephrologist who made medication changes. She is supposed to get a repeat BMP in 2 weeks. Her  decrease in renal function was a limiting factor in obtaining an arteriogram for potential intervention for revascularization. She currently denies signs of infection. 9/2; patient presents for follow-up. She has been using Keystone antibiotic and collagen to the wound beds. She has no issues or complaints today. Reports there has been improvement in kidney function however not cleared to have her arteriogram just yet. 10/10; patient presents for follow-up. She has been using Keystone antibiotic and collagen to the wound beds. She reports 2 new wounds 1 to the anterior right lower extremity and another to the plantar aspect of the right foot. She states that the right plantar foot wound was caused by the home health nurse changing the dressing and causing a skin tear. She is not sure how the right anterior leg wound started. It appears to be from trauma. She denies signs of infection. 10/19; patient presents for follow-up. She has been using Keystone antibiotic and collagen to the right great toe wound. She is been using silver alginate to the right anterior and right plantar foot wound. She has been using Tubigrip to the right lower extremity. The plantar foot wound has healed. She has no issues or complaints today. 11/3; since the patient was last here she was seen in urgent care apparently for an area on the dorsal aspect of the right fifth toe perhaps over the PIP. I saw a picture of this on the daughter's cell phone. There was slough on this. Urgent care gave him doxycycline. She is also changed the dressing to all wounds  back the Brigantine and collagen which includes her right leg and left first toe 11/16; patient presents for follow-up. She has a new wound to the left knee. She states she fell. She has been using antibiotic ointment and collagen to this area. She has been using collagen and Keystone antibiotic to the right great toe wound. The anterior right leg wound is healed. She denies signs  of infection. 11/30; patient presents for follow-up. The right great toe wound has healed. She has 1 remaining wound to the left knee. She has been using Hydrofera Blue and Medihoney here. 12/19; patient presents for follow-up. Her left knee wound has healed. She has no issues or complaints today. 09/30/2022 Patient's daughter called for an appointment due to increased swelling to her lower extremities bilaterally. Today patient presents with increased swelling to her lower extremities although there is no increased redness or warmth. She was evaluated by her nephrologist who increased her diuretics. Per patient and daughter her swelling has gone down to the leg Over the past several days. She does not have any open wounds. She was advised to elevate her legs and not consume excess salt By her PCP and nephrologist. Patient has compression stockings that she has been using sporadically. READMISSION 03/10/2023 Since her last visit to the clinic, the patient has been hospitalized at least twice, in February with COVID pneumonia and CHF exacerbation. She was discharged to a skilled nursing facility and then readmitted in April with fevers. She was noted to have pressure ulcers on her heels and sacrum. Per family declined skilled nursing placement upon discharge and she has apparently been residing at home. She has followed with the vascular surgery clinic regarding peripheral artery disease and due to ulcerations on her feet, she ultimately underwent arteriography with balloon angioplasties of the superficial femoral artery, popliteal artery and peroneal artery on the left, along with shockwave ultrasound assisted balloon angioplasty of the popliteal artery and distal SFA. Given her multiple significant medical comorbidities, she is not a candidate for open surgery. She is deemed to be maximally vascularized this procedure, which was performed on 04 March 2023. T oday, she has a stage III pressure ulcer on  her left heel and wounds on the PIP joints of her right third and left second toes. No sacral wound is present and the ulcer on her left heel has closed. 03/18/2023: She has a new ulcer on her left great toe. It was just noticed yesterday and the etiology is unknown. The fat layer is exposed and there is some slough accumulation. The other wounds are all essentially unchanged in size. There is a little bit more granulation tissue on the other toe wounds, but bone is still frankly exposed on both sides. The heel ulcer has thick slough accumulation. MALERIE, EAKINS ([id number]) [id number] Page 7 of 18 03/26/2023: She has a new wound on the tip of her left third toe. It is black. Neither the patient nor her daughter are aware of how it began. The other wounds are stable. 03/31/2023: The culture that I took from her left third toe returned with MRSA. We contacted the patient's daughter to have her stop levofloxacin and start doxycycline, but she has not yet picked up the new antibiotic. All of her wounds have accumulated slough. Bone remains exposed. She is scheduled to follow- up with vascular surgery tomorrow to evaluate the patency of her revascularization. Electronic Signature(s) Signed: 03/31/2023 3:13:43 PM By: Duanne Guess MD FACS Entered By: Duanne Guess on 03/31/2023  15:13:43 -------------------------------------------------------------------------------- Physical Exam Details Patient Name: Date of Service: [name], [name] 03/31/2023 1:45 PM Medical Record Number: [id number] Patient Account Number: [id number] Date of Birth/Sex: Treating RN: [birth year] (89 y.o. F) Primary Care Provider: [name] Other Clinician: Referring Provider: Treating Provider/Extender: Claudie Leach Weeks in Treatment: 3 Constitutional Hypertensive, asymptomatic. . . . Chronically ill-appearing, but in no acute distress.[name] Kitchen Respiratory Normal work of breathing on  supplemental oxygen. Notes All of her wounds have accumulated slough. Bone remains exposed. Electronic Signature(s) Signed: 03/31/2023 3:14:53 PM By: Duanne Guess MD FACS Entered By: Duanne Guess on 03/31/2023 15:14:53 -------------------------------------------------------------------------------- Physician Orders Details Patient Name: Date of Service: [name]. 03/31/2023 1:45 PM Medical Record Number: [id number] Patient Account Number: [id number] Date of Birth/Sex: Treating RN: [birth year] (89 y.o. 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 - Dr. Lady Gary - room 3 Return A Tuesday 8/1 @ 2:45 pm Anesthetic (In clinic) Topical Lidocaine 4% applied to wound bed Bathing/ Shower/ Hygiene May shower and wash wound with soap and water. Edema Control - Lymphedema / SCD / Other Elevate legs to the level of the heart or above for 30 minutes daily and/or when sitting for 3-4 times a day throughout the day. Avoid standing for long periods of time. Patient to wear own compression stockings every day. Additional Orders  / Instructions Follow Nutritious Diet - add in protein shakes every day to diet - recommend premier protein 500 mg x3 a day vitamin C, zinc 30-50 mg per day Home Health New wound care orders this week; continue Home Health for wound care. May utilize formulary equivalent dressing for wound treatment orders unless otherwise specified. - HH 2x/week for 2 weeks the recertify for wound care Other Home Health Orders/Instructions: - [facility] Home Wound Treatment Wound #10 - T Second oe Wound Laterality: Left Cleanser: Soap and Water 1 x Per Day/30 Days Discharge Instructions: May shower and wash wound with dial antibacterial soap and water prior to dressing change. Cleanser: Wound Cleanser 1 x Per Day/30 Days Discharge Instructions: Cleanse the wound with wound cleanser prior to applying a clean dressing using gauze sponges, not tissue or cotton balls. Prim Dressing: Promogran Prisma Matrix, 4.34 (sq in) (silver collagen) (Generic) 1 x Per Day/30 Days ary Discharge Instructions: Moisten collagen with saline or hydrogel Secondary Dressing: Woven Gauze Sponge, Non-Sterile 4x4 in (Generic) 1 x Per Day/30 Days Discharge Instructions: Apply over primary dressing as directed. Secured With: 37M Medipore H Soft Cloth Surgical T ape, 4 x 10 (in/yd) (Generic) 1 x Per Day/30 Days Discharge Instructions: Secure with tape as directed. Wound #11 - T Third oe Wound Laterality: Right Cleanser: Soap and Water 1 x Per Day/30 Days Discharge Instructions: May shower and wash wound with dial antibacterial soap and water prior to dressing change. Cleanser: Wound  Cleanser 1 x Per Day/30 Days Discharge Instructions: Cleanse the wound with wound cleanser prior to applying a clean dressing using gauze sponges, not tissue or cotton balls. Prim Dressing: Promogran Prisma Matrix, 4.34 (sq in) (silver collagen) (Generic) 1 x Per Day/30 Days ary Discharge Instructions: Moisten collagen with saline or hydrogel Secondary  Dressing: Woven Gauze Sponge, Non-Sterile 4x4 in (Generic) 1 x Per Day/30 Days Discharge Instructions: Apply over primary dressing as directed. Secured With: 61M Medipore H Soft Cloth Surgical T ape, 4 x 10 (in/yd) (Generic) 1 x Per Day/30 Days Discharge Instructions: Secure with tape as directed. Wound #12 - T Great oe Wound Laterality: Left Cleanser: Soap and Water 1 x Per Day/30 Days Discharge Instructions: May shower and wash wound with dial antibacterial soap and water prior to dressing change. Cleanser: Wound Cleanser 1 x Per Day/30 Days Discharge Instructions: Cleanse the wound with wound cleanser prior to applying a clean dressing using gauze sponges, not tissue or cotton balls. Prim Dressing: Promogran Prisma Matrix, 4.34 (sq in) (silver collagen) (Generic) 1 x Per Day/30 Days ary Discharge Instructions: Moisten collagen with saline or hydrogel Secondary Dressing: Woven Gauze Sponge, Non-Sterile 4x4 in (Generic) 1 x Per Day/30 Days Discharge Instructions: Apply over primary dressing as directed. Secured With: 61M Medipore H Soft Cloth Surgical T ape, 4 x 10 (in/yd) (Generic) 1 x Per Day/30 Days Discharge Instructions: Secure with tape as directed. Wound #13 - T Great oe Wound Laterality: Dorsal, Left Cleanser: Soap and Water 1 x Per Day/30 Days Discharge Instructions: May shower and wash wound with dial antibacterial soap and water prior to dressing change. BLASA, RAISCH ([id number]) [id number] Page 9 of 18 Cleanser: Wound Cleanser 1 x Per Day/30 Days Discharge Instructions: Cleanse the wound with wound cleanser prior to applying a clean dressing using gauze sponges, not tissue or cotton balls. Prim Dressing: Promogran Prisma Matrix, 4.34 (sq in) (silver collagen) (Generic) 1 x Per Day/30 Days ary Discharge Instructions: Moisten collagen with saline or hydrogel Secondary Dressing: Woven Gauze Sponge, Non-Sterile 4x4 in (Generic) 1 x Per Day/30  Days Discharge Instructions: Apply over primary dressing as directed. Secured With: 61M Medipore H Soft Cloth Surgical T ape, 4 x 10 (in/yd) (Generic) 1 x Per Day/30 Days Discharge Instructions: Secure with tape as directed. Wound #14 - T Third oe Wound Laterality: Left Cleanser: Soap and Water 1 x Per Day/30 Days Discharge Instructions: May shower and wash wound with dial antibacterial soap and water prior to dressing change. Cleanser: Wound Cleanser 1 x Per Day/30 Days Discharge Instructions: Cleanse the wound with wound cleanser prior to applying a clean dressing using gauze sponges, not tissue or cotton balls. Prim Dressing: Promogran Prisma Matrix, 4.34 (sq in) (silver collagen) (Generic) 1 x Per Day/30 Days ary Discharge Instructions: Moisten collagen with saline or hydrogel Secondary Dressing: Woven Gauze Sponge, Non-Sterile 4x4 in (Generic) 1 x Per Day/30 Days Discharge Instructions: Apply over primary dressing as directed. Secured With: 61M Medipore H Soft Cloth Surgical T ape, 4 x 10 (in/yd) (Generic) 1 x Per Day/30 Days Discharge Instructions: Secure with tape as directed. Wound #15 - T Great oe Wound Laterality: Dorsal, Right Cleanser: Soap and Water 1 x Per Day/30 Days Discharge Instructions: May shower and wash wound with dial antibacterial soap and water prior to dressing change. Cleanser: Wound Cleanser 1 x Per Day/30 Days Discharge Instructions: Cleanse the wound with wound cleanser prior to applying a clean dressing using gauze sponges, not tissue or cotton balls. Prim Dressing: Promogran  Prisma Matrix, 4.34 (sq in) (silver collagen) (Generic) 1 x Per Day/30 Days ary Discharge Instructions: Moisten collagen with saline or hydrogel Secondary Dressing: Woven Gauze Sponge, Non-Sterile 4x4 in (Generic) 1 x Per Day/30 Days Discharge Instructions: Apply over primary dressing as directed. Secured With: 216M Medipore H Soft Cloth Surgical T ape, 4 x 10 (in/yd) (Generic) 1 x Per  Day/30 Days Discharge Instructions: Secure with tape as directed. Wound #9 - Calcaneus Wound Laterality: Left Cleanser: Soap and Water 1 x Per Day/30 Days Discharge Instructions: May shower and wash wound with dial antibacterial soap and water prior to dressing change. Cleanser: Wound Cleanser 1 x Per Day/30 Days Discharge Instructions: Cleanse the wound with wound cleanser prior to applying a clean dressing using gauze sponges, not tissue or cotton balls. Prim Dressing: Promogran Prisma Matrix, 4.34 (sq in) (silver collagen) (Generic) 1 x Per Day/30 Days ary Discharge Instructions: Moisten collagen with saline or hydrogel Secondary Dressing: ALLEVYN Heel 4 1/2in x 5 1/2in / 10.5cm x 13.5cm (Generic) 1 x Per Day/30 Days Discharge Instructions: Apply over primary dressing as directed. Secondary Dressing: Woven Gauze Sponge, Non-Sterile 4x4 in (Generic) 1 x Per Day/30 Days Discharge Instructions: Apply over primary dressing as directed. Secured With: 216M Medipore H Soft Cloth Surgical T ape, 4 x 10 (in/yd) (Generic) 1 x Per Day/30 Days Discharge Instructions: Secure with tape as directed. Patient Medications llergies: penicillin, erythromycin base, green dyes, iodine, sulfasalazine, tramadol, oxycodone, codeine A Notifications Medication Indication Start End 03/31/2023 lidocaine DOSE topical 4 % cream - cream topical once daily Electronic Signature(s) Kroner, Makaylyn M ([id number]) [id number] Page 10 of 18 Signed: 03/31/2023 4:18:25 PM By: Duanne Guess MD FACS Entered By: Duanne Guess on 03/31/2023 15:15:10 -------------------------------------------------------------------------------- Problem List Details Patient Name: Date of Service: [name]. 03/31/2023 1:45 PM Medical Record Number: [id number] Patient Account Number: [id number] Date of Birth/Sex: Treating RN: 21-Nov-1927 (89 y.o. 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 03/10/2023 No Yes without evidence of necrosis 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 03/10/2023 No Yes without evidence of necrosis L97.522 Non-pressure chronic ulcer of other part of left foot with fat layer exposed 03/18/2023 No Yes I73.9 Peripheral vascular disease, unspecified 03/10/2023 No Yes I50.32 Chronic diastolic (congestive) heart failure 03/10/2023 No Yes N18.32 Chronic kidney disease, stage 3b 03/10/2023 No Yes I87.2 Venous insufficiency (chronic) (peripheral) 03/10/2023 No Yes Inactive Problems Resolved Problems Electronic Signature(s) Signed: 03/31/2023 3:07:28 PM By: Duanne Guess MD FACS Entered By: Duanne Guess on 03/31/2023 15:07:28 Mcgurn, [name] ([id number]) [id number] Page 11 of 18 -------------------------------------------------------------------------------- Progress Note Details Patient Name: Date of Service: [name], [name] 03/31/2023 1:45 PM Medical Record Number: [id number] Patient Account Number: [id number] Date of Birth/Sex: Treating RN: 22-Sep-1927 (89 y.o. F) Primary Care Provider: [name] Other Clinician: Referring Provider: Treating Provider/Extender: Claudie Leach Weeks in Treatment: 3 Subjective Chief Complaint Information obtained from Patient 01/28/2022; right second toe amputation site dehiscence and bilateral lower extremity wounds. 03/10/2023: pressure ulcers of right heel, ulcers on toes History of Present Illness (HPI) Admission 01/28/2022 Ms. 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 She states she has tried Medihoney and silver alginate in the past to this area with little benefit. She also has 2 small areas limited to skin breakdown to her lower extremities bilaterally. She has chronic venous insufficiency but not has not been wearing her compression stockings. She states she bumped her legs against an object and not so the wound started. She has been using Medihoney to the sites. She denies signs of infection. 6/2; patient presents for follow-up. She had an x-ray of her right foot done at last clinic visit and this was negative for evidence of osteomyelitis. She also had a wound culture done that showed extra high levels of Staph aureus. I recommended Keystone antibiotics for this and this was ordered. She had ABIs with TBI's done as well that showed monophasic waveforms to the right foot with TBI of 0 and an ABI of 0.52. Urgent referral was made to vein and vascular and she saw Dr. Durwin Nora on 6/1, yesterday and he recommended an arteriogram. This is scheduled for 6/16. Patient also reports a new wound to the right great toe. This is a blister that has ruptured. She also reports increased erythema to the toe. 6/6; the patient was worked in urgently today at the insistence of her daughter out of concern for a new wound on the lateral part of the plantar right great toe. She has her original postsurgical wound after the amputation of the right second toe, she has a wound on the medial part of the right great toe. The patient is apparently going for an angiogram by Dr. Durwin Nora in 2 weeks time. 6/13; patient presents for follow-up. She has been using bacitracin to the abrasion on the right great toe. She has been using collagen and Keystone  antibiotic to the amputation site. She has no issues or complaints today. She denies signs of infection. 6/22; patient presents for follow-up. She states that her abdominal aortogram was canceled due to her renal function. She has been using Keystone antibiotics to the amputation site and Medihoney to the right great toe wound. At the pace of the right great toe she has a slitlike open area that she thinks was caused by the tape from the dressing. 6/29; patient presents for follow-up. She has been using Keystone antibiotics to the amputation site along with collagen. She has been using Medihoney to the right great toe wound. She has no other wounds. She denies signs of infection. 7/13; patient presents for follow-up. She has been using Keystone antibiotics and collagen to the wound sites. She currently denies signs of infection. She states she is scheduled to see me nephrology next month. 7/20; patient presents for follow-up. She continue Keystone antibiotics and collagen to the wound sites. She has no issues or complaints today. 8/1; patient presents for follow up. She continues to use keystone antibiotics and collagen to the wound sites. She has no issues or complaints today. 8/15; patient presents for follow-up. She has been using Keystone antibiotics and collagen to the wound sites. She followed up with her nephrologist who made medication changes. She is supposed to get a repeat BMP in 2 weeks. Her decrease in renal function was a limiting factor in obtaining an arteriogram for potential intervention for revascularization. She currently denies signs of infection. 9/2; patient presents for  follow-up. She has been using Keystone antibiotic and collagen to the wound beds. She has no issues or complaints today. Reports there has been improvement in kidney function however not cleared to have her arteriogram just yet. 10/10; patient presents for follow-up. She has been using Keystone antibiotic and  collagen to the wound beds. She reports 2 new wounds 1 to the anterior right lower extremity and another to the plantar aspect of the right foot. She states that the right plantar foot wound was caused by the home health nurse changing the dressing and causing a skin tear. She is not sure how the right anterior leg wound started. It appears to be from trauma. She denies signs of infection. 10/19; patient presents for follow-up. She has been using Keystone antibiotic and collagen to the right great toe wound. She is been using silver alginate to the right anterior and right plantar foot wound. She has been using Tubigrip to the right lower extremity. The plantar foot wound has healed. She has no issues or complaints today. 11/3; since the patient was last here she was seen in urgent care apparently for an area on the dorsal aspect of the right fifth toe perhaps over the PIP. I saw a picture of this on the daughter's cell phone. There was slough on this. Urgent care gave him doxycycline. She is also changed the dressing to all wounds back the [facility] and collagen which includes her right leg and left first toe 11/16; patient presents for follow-up. She has a new wound to the left knee. She states she fell. She has been using antibiotic ointment and collagen to this area. She has been using collagen and Keystone antibiotic to the right great toe wound. The anterior right leg wound is healed. She denies signs of infection. 11/30; patient presents for follow-up. The right great toe wound has healed. She has 1 remaining wound to the left knee. She has been using Hydrofera Blue and Medihoney here. 12/19; patient presents for follow-up. Her left knee wound has healed. She has no issues or complaints today. 09/30/2022 Patient's daughter called for an appointment due to increased swelling to her lower extremities bilaterally. Today patient presents with increased swelling to her lower extremities although  there is no increased redness or warmth. She was evaluated by her nephrologist who increased her diuretics. Per patient and KARRI, KALLENBACH ([id number]) [id number] Page 12 of 18 daughter her swelling has gone down to the leg Over the past several days. She does not have any open wounds. She was advised to elevate her legs and not consume excess salt By her PCP and nephrologist. Patient has compression stockings that she has been using sporadically. READMISSION 03/10/2023 Since her last visit to the clinic, the patient has been hospitalized at least twice, in February with COVID pneumonia and CHF exacerbation. She was discharged to a skilled nursing facility and then readmitted in April with fevers. She was noted to have pressure ulcers on her heels and sacrum. Per family declined skilled nursing placement upon discharge and she has apparently been residing at home. She has followed with the vascular surgery clinic regarding peripheral artery disease and due to ulcerations on her feet, she ultimately underwent arteriography with balloon angioplasties of the superficial femoral artery, popliteal artery and peroneal artery on the left, along with shockwave ultrasound assisted balloon angioplasty of the popliteal artery and distal SFA. Given her multiple significant medical comorbidities, she is not a candidate for open surgery. She is deemed to  be maximally vascularized this procedure, which was performed on 04 March 2023. T oday, she has a stage III pressure ulcer on her left heel and wounds on the PIP joints of her right third and left second toes. No sacral wound is present and the ulcer on her left heel has closed. 03/18/2023: She has a new ulcer on her left great toe. It was just noticed yesterday and the etiology is unknown. The fat layer is exposed and there is some slough accumulation. The other wounds are all essentially unchanged in size. There is a little bit more  granulation tissue on the other toe wounds, but bone is still frankly exposed on both sides. The heel ulcer has thick slough accumulation. 03/26/2023: She has a new wound on the tip of her left third toe. It is black. Neither the patient nor her daughter are aware of how it began. The other wounds are stable. 03/31/2023: The culture that I took from her left third toe returned with MRSA. We contacted the patient's daughter to have her stop levofloxacin and start doxycycline, but she has not yet picked up the new antibiotic. All of her wounds have accumulated slough. Bone remains exposed. She is scheduled to follow- up with vascular surgery tomorrow to evaluate the patency of her revascularization. Patient History Information obtained from Patient, Caregiver, Chart. Family History Cancer - Siblings, Heart Disease, Stroke - Mother, No family history of Diabetes. Social History Former smoker - quit 2003, Marital Status - Widowed, Alcohol Use - Never, Drug Use - No History, Caffeine Use - Never. Medical History Eyes Patient has history of Cataracts Hematologic/Lymphatic Patient has history of Anemia Respiratory Denies history of Chronic Obstructive Pulmonary Disease (COPD) Cardiovascular Patient has history of Arrhythmia - a-fib, Congestive Heart Failure, Hypotension, Peripheral Venous Disease Musculoskeletal Patient has history of 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 Neurologic Patient has history of Neuropathy Hospitalization/Surgery History - Abdominal aortogram w/lower extremity. - Amputation toe (Right). - Colonoscopy. - Elbow surgery. - Laparoscopic hysterectomy. Medical A Surgical History Notes nd Hematologic/Lymphatic Thrombocytopenia, Anticoagulant long-term use Respiratory Pulmonary embolus Gastrointestinal Hiatal hernia Endocrine Hyperthyroidism, Hypothyroidism Genitourinary CKD stage III Musculoskeletal arthritis Objective Constitutional Hypertensive,  asymptomatic. Chronically ill-appearing, but in no acute distress.. Vitals Time Taken: 1:50 PM, Height: 64 in, Weight: 157 lbs, BMI: 26.9, Temperature: 98.6 F, Pulse: 73 bpm, Respiratory Rate: 20 breaths/min, Blood Pressure: 155/78 mmHg. Respiratory Normal work of breathing on supplemental oxygen. General Notes: All of her wounds have accumulated slough. Bone remains exposed. POPPI, SCANTLING ([id number]) [id number] Page 13 of 18 Integumentary (Hair, Skin) Wound #10 status is Open. Original cause of wound was Pressure Injury. The date acquired was: 10/09/2022. The wound has been in treatment 3 weeks. The wound is located on the Left T Second. The wound measures 0.6cm length x 0.9cm width x 0.1cm depth; 0.424cm^2 area and 0.042cm^3 volume. There is oe bone, joint, and Fat Layer (Subcutaneous Tissue) exposed. There is no tunneling or undermining noted. There is a small amount of serosanguineous drainage noted. The wound margin is distinct with the outline attached to the wound base. There is large (67-100%) red granulation within the wound bed. There is a small (1-33%) amount of necrotic tissue within the wound bed including Adherent Slough. The periwound skin appearance had no abnormalities noted for texture. The periwound skin appearance had no abnormalities noted for moisture. The periwound skin appearance had no abnormalities noted for color. Periwound temperature was noted as No Abnormality. Wound #11 status is Open. Original  cause of wound was Pressure Injury. The date acquired was: 10/09/2022. The wound has been in treatment 3 weeks. The wound is located on the Right T Third. The wound measures 0.8cm length x 0.9cm width x 0.1cm depth; 0.565cm^2 area and 0.057cm^3 volume. There is oe bone, joint, and Fat Layer (Subcutaneous Tissue) exposed. There is no tunneling or undermining noted. There is a medium amount of serosanguineous drainage noted. The wound margin is  distinct with the outline attached to the wound base. There is large (67-100%) pink granulation within the wound bed. There is a small (1-33%) amount of necrotic tissue within the wound bed including Adherent Slough. The periwound skin appearance had no abnormalities noted for texture. The periwound skin appearance had no abnormalities noted for moisture. The periwound skin appearance had no abnormalities noted for color. Periwound temperature was noted as No Abnormality. Wound #12 status is Open. Original cause of wound was Gradually Appeared. The date acquired was: 03/17/2023. The wound has been in treatment 1 weeks. The wound is located on the Left T Great. The wound measures 0.4cm length x 0.3cm width x 0.1cm depth; 0.094cm^2 area and 0.009cm^3 volume. There is Fat oe Layer (Subcutaneous Tissue) exposed. There is no tunneling or undermining noted. There is a medium amount of serous drainage noted. The wound margin is flat and intact. There is small (1-33%) pink granulation within the wound bed. There is a large (67-100%) amount of necrotic tissue within the wound bed including Eschar and Adherent Slough. The periwound skin appearance had no abnormalities noted for texture. The periwound skin appearance had no abnormalities noted for moisture. The periwound skin appearance exhibited: Erythema. The surrounding wound skin color is noted with erythema which is circumferential. Periwound temperature was noted as No Abnormality. The periwound has tenderness on palpation. Wound #13 status is Open. Original cause of wound was Gradually Appeared. The date acquired was: 03/24/2023. The wound is located on the [employer]. The wound measures 0.4cm length x 0.4cm width x 0.1cm depth; 0.126cm^2 area and 0.013cm^3 volume. There is Fat Layer (Subcutaneous Tissue) exposed. There is no tunneling or undermining noted. There is a medium amount of serosanguineous drainage noted. There is small (1-33%) red,  pink granulation within the wound bed. There is a large (67-100%) amount of necrotic tissue within the wound bed including Adherent Slough. Wound #14 status is Open. Original cause of wound was Gradually Appeared. The date acquired was: 03/24/2023. The wound is located on the Left T Third. oe The wound measures 0.4cm length x 0.6cm width x 0.1cm depth; 0.188cm^2 area and 0.019cm^3 volume. There is bone and Fat Layer (Subcutaneous Tissue) exposed. There is no tunneling or undermining noted. There is a medium amount of serosanguineous drainage noted. The wound margin is distinct with the outline attached to the wound base. There is medium (34-66%) red granulation within the wound bed. There is a medium (34-66%) amount of necrotic tissue within the wound bed including Adherent Slough. The periwound skin appearance exhibited: Scarring, Hemosiderin Staining. The periwound skin appearance did not exhibit: Dry/Scaly, Maceration. Periwound temperature was noted as No Abnormality. Wound #15 status is Open. Original cause of wound was Gradually Appeared. The date acquired was: 03/26/2023. The wound is located on the Right,Dorsal T oe Great. The wound measures 0.5cm length x 0.8cm width x 0.1cm depth; 0.314cm^2 area and 0.031cm^3 volume. There is Fat Layer (Subcutaneous Tissue) exposed. There is no tunneling or undermining noted. There is a none present amount of drainage noted. The wound  margin is flat and intact. There is large (67- 100%) pink granulation within the wound bed. There is a small (1-33%) amount of necrotic tissue within the wound bed including Adherent Slough. The periwound skin appearance had no abnormalities noted for texture. The periwound skin appearance had no abnormalities noted for moisture. The periwound skin appearance had no abnormalities noted for color. Periwound temperature was noted as No Abnormality. The periwound has tenderness on palpation. Wound #9 status is Open. Original cause  of wound was Pressure Injury. The date acquired was: 10/22/2022. The wound has been in treatment 3 weeks. The wound is located on the Left Calcaneus. The wound measures 0.9cm length x 0.7cm width x 0.5cm depth; 0.495cm^2 area and 0.247cm^3 volume. There is Fat Layer (Subcutaneous Tissue) exposed. There is no tunneling or undermining noted. There is a medium amount of serosanguineous drainage noted. The wound margin is distinct with the outline attached to the wound base. There is small (1-33%) red granulation within the wound bed. There is a large (67-100%) amount of necrotic tissue within the wound bed including Adherent Slough. The periwound skin appearance had no abnormalities noted for texture. The periwound skin appearance had no abnormalities noted for color. The periwound skin appearance exhibited: Dry/Scaly. Periwound temperature was noted as No Abnormality. The periwound has tenderness on palpation. 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 There was a Selective/Open Wound Non-Viable Tissue oe Debridement with a total area of 0.42 sq cm performed by Duanne Guess, MD. With the following instrument(s): Curette to remove Non-Viable tissue/material. Material removed includes [facility] after achieving pain control using Lidocaine 4% Topical Solution. No specimens were taken. A time out was conducted at 14:45,  prior to the start of the procedure. A Minimum amount of bleeding was controlled with Pressure. The procedure was tolerated well. Post Debridement Measurements: 0.6cm length x 0.9cm width x 0.1cm depth; 0.042cm^3 volume. Post debridement Stage noted as Category/Stage IV. Character of Wound/Ulcer Post Debridement is improved. Post procedure Diagnosis Wound #10: Same as Pre-Procedure General Notes: Scribed for Dr Lady Gary by Redmond Pulling, Rn. Wound #11 Pre-procedure diagnosis of Wound #11 is a Pressure Ulcer located on the Right T Third . There was a Selective/Open Wound Non-Viable Tissue Debridement oe with a total area of 0.57 sq cm performed by Duanne Guess, MD. With the following instrument(s): Curette to remove Non-Viable tissue/material. Material Tersea, Aulds [name] ([id number]) [id number] Page 14 of 18 removed includes Slough after achieving pain control using Lidocaine 4% Topical Solution. No specimens were taken. A time out was conducted at 14:45, prior to the start of the procedure. A Minimum amount of bleeding was controlled with Pressure. The procedure was tolerated well. Post Debridement Measurements: 0.8cm length x 0.9cm width x 0.1cm depth; 0.057cm^3 volume. Post debridement Stage noted as Category/Stage IV. Character of Wound/Ulcer Post Debridement is improved. Post procedure Diagnosis Wound #11: Same as Pre-Procedure General Notes: Scribed for Dr Lady Gary by Redmond Pulling, Rn. Wound #12 Pre-procedure diagnosis of Wound #12 is an Arterial Insufficiency  Ulcer located on the Left T Great .Severity of Tissue Pre Debridement is: Fat layer oe exposed. There was a Selective/Open Wound Non-Viable Tissue Debridement with a total area of 0.09 sq cm performed by Duanne Guess, MD. With the following instrument(s): Curette to remove Non-Viable tissue/material. Material removed includes [facility] after achieving pain control using Lidocaine 4% Topical Solution. No  specimens were taken. A time out was conducted at 14:45, prior to the start of the procedure. A Minimum amount of bleeding was controlled with Pressure. The procedure was tolerated well. Post Debridement Measurements: 0.4cm length x 0.3cm width x 0.1cm depth; 0.009cm^3 volume. Character of Wound/Ulcer Post Debridement is improved. Severity of Tissue Post Debridement is: Fat layer exposed. Post procedure Diagnosis Wound #12: Same as Pre-Procedure General Notes: Scribed for Dr Lady Gary by Redmond Pulling, Rn. Wound #14 Pre-procedure diagnosis of Wound #14 is an Arterial Insufficiency Ulcer located on the Left T Third .Severity of Tissue Pre Debridement is: Fat layer oe exposed. There was a Selective/Open Wound Non-Viable Tissue Debridement with a total area of 0.19 sq cm performed by Duanne Guess, MD. With the following instrument(s): Curette to remove Non-Viable tissue/material. Material removed includes [facility] after achieving pain control using Lidocaine 4% Topical Solution. No specimens were taken. A time out was conducted at 14:45, prior to the start of the procedure. A Minimum amount of bleeding was controlled with Pressure. The procedure was tolerated well. Post Debridement Measurements: 0.4cm length x 0.6cm width x 0.1cm depth; 0.019cm^3 volume. Character of Wound/Ulcer Post Debridement is improved. Severity of Tissue Post Debridement is: Fat layer exposed. Post procedure Diagnosis Wound #14: Same as Pre-Procedure General Notes: Scribed for Dr Lady Gary by Redmond Pulling, Rn. Wound #15 Pre-procedure diagnosis of Wound #15 is an Arterial Insufficiency Ulcer located on the Right,Dorsal T Great .Severity of Tissue Pre Debridement is: Fat oe layer exposed. There was a Selective/Open Wound Non-Viable Tissue Debridement with a total area of 0.31 sq cm performed by Duanne Guess, MD. With the following instrument(s): Curette to remove Non-Viable tissue/material. Material removed includes [facility] after  achieving pain control using Lidocaine 4% Topical Solution. No specimens were taken. A time out was conducted at 14:45, prior to the start of the procedure. A Minimum amount of bleeding was controlled with Pressure. The procedure was tolerated well. Post Debridement Measurements: 0.5cm length x 0.8cm width x 0.1cm depth; 0.031cm^3 volume. Character of Wound/Ulcer Post Debridement is improved. Severity of Tissue Post Debridement is: Fat layer exposed. Post procedure Diagnosis Wound #15: Same as Pre-Procedure General Notes: Scribed for Dr Lady Gary by Redmond Pulling, Rn. Wound #9 Pre-procedure diagnosis of Wound #9 is a Pressure Ulcer located on the Left Calcaneus . There was a Selective/Open Wound Non-Viable Tissue Debridement with a total area of 0.49 sq cm performed by Duanne Guess, MD. With the following instrument(s): Curette to remove Non-Viable tissue/material. Material removed includes [facility] after achieving pain control using Lidocaine 4% Topical Solution. No specimens were taken. A time out was conducted at 14:45, prior to the start of the procedure. A Minimum amount of bleeding was controlled with Pressure. The procedure was tolerated well. Post Debridement Measurements: 0.9cm length x 0.7cm width x 0.5cm depth; 0.247cm^3 volume. Post debridement Stage noted as Category/Stage II. Character of Wound/Ulcer Post Debridement is improved. Post procedure Diagnosis Wound #9: Same as Pre-Procedure General Notes: Scribed for Dr Lady Gary by Redmond Pulling, Rn. Plan Follow-up Appointments: Return Appointment in 1 week. - Dr. Lady Gary - room 3 Tuesday 8/1 @ 2:45 pm Anesthetic: (In clinic)  Topical Lidocaine 4% applied to wound bed Bathing/ Shower/ Hygiene: May shower and wash wound with soap and water. Edema Control - Lymphedema / SCD / Other: Elevate legs to the level of the heart or above for 30 minutes daily and/or when sitting for 3-4 times a day throughout the day. Avoid standing for long periods  of time. Patient to wear own compression stockings every day. Additional Orders / Instructions: Follow Nutritious Diet - add in protein shakes every day to diet - recommend premier protein 500 mg x3 a day vitamin C, zinc 30-50 mg per day Home Health: New wound care orders this week; continue Home Health for wound care. May utilize formulary equivalent dressing for wound treatment orders unless otherwise specified. - HH 2x/week for 2 weeks the recertify for wound care Other Home Health Orders/Instructions: - Medi Home The following medication(s) was prescribed: lidocaine topical 4 % cream cream topical once daily was prescribed at facility WOUND #10: - T Second Wound Laterality: Left oe Cleanser: Soap and Water 1 x Per Day/30 Days Discharge Instructions: May shower and wash wound with dial antibacterial soap and water prior to dressing change. Cleanser: Wound Cleanser 1 x Per Day/30 Days Discharge Instructions: Cleanse the wound with wound cleanser prior to applying a clean dressing using gauze sponges, not tissue or cotton balls. Prim Dressing: Promogran Prisma Matrix, 4.34 (sq in) (silver collagen) (Generic) 1 x Per Day/30 Days ary Discharge Instructions: Moisten collagen with saline or hydrogel Secondary Dressing: Woven Gauze Sponge, Non-Sterile 4x4 in (Generic) 1 x Per Day/30 Days Discharge Instructions: Apply over primary dressing as directed. Secured With: 59M Medipore H Soft Cloth Surgical T ape, 4 x 10 (in/yd) (Generic) 1 x Per Day/30 Days Discharge Instructions: Secure with tape as directed. WOUND #11: - T Third Wound Laterality: Right oe Cleanser: Soap and Water 1 x Per Day/30 Days Discharge Instructions: May shower and wash wound with dial antibacterial soap and water prior to dressing change. Cleanser: Wound Cleanser 1 x Per Day/30 Days Discharge Instructions: Cleanse the wound with wound cleanser prior to applying a clean dressing using gauze sponges, not tissue or cotton  balls. Prim Dressing: Promogran Prisma Matrix, 4.34 (sq in) (silver collagen) (Generic) 1 x Per Day/30 Days ary Discharge Instructions: Moisten collagen with saline or hydrogel Secondary Dressing: Woven Gauze Sponge, Non-Sterile 4x4 in (Generic) 1 x Per Day/30 Days Discharge Instructions: Apply over primary dressing as directed. DAJE, STARK ([id number]) [id number] Page 15 of 18 Secured With: 59M Medipore H Soft Cloth Surgical T ape, 4 x 10 (in/yd) (Generic) 1 x Per Day/30 Days Discharge Instructions: Secure with tape as directed. WOUND #12: - T Great Wound Laterality: Left oe Cleanser: Soap and Water 1 x Per Day/30 Days Discharge Instructions: May shower and wash wound with dial antibacterial soap and water prior to dressing change. Cleanser: Wound Cleanser 1 x Per Day/30 Days Discharge Instructions: Cleanse the wound with wound cleanser prior to applying a clean dressing using gauze sponges, not tissue or cotton balls. Prim Dressing: Promogran Prisma Matrix, 4.34 (sq in) (silver collagen) (Generic) 1 x Per Day/30 Days ary Discharge Instructions: Moisten collagen with saline or hydrogel Secondary Dressing: Woven Gauze Sponge, Non-Sterile 4x4 in (Generic) 1 x Per Day/30 Days Discharge Instructions: Apply over primary dressing as directed. Secured With: 59M Medipore H Soft Cloth Surgical T ape, 4 x 10 (in/yd) (Generic) 1 x Per Day/30 Days Discharge Instructions: Secure with tape as directed. WOUND #13: - T Great Wound Laterality: Dorsal, Left oe Cleanser:  Soap and Water 1 x Per Day/30 Days Discharge Instructions: May shower and wash wound with dial antibacterial soap and water prior to dressing change. Cleanser: Wound Cleanser 1 x Per Day/30 Days Discharge Instructions: Cleanse the wound with wound cleanser prior to applying a clean dressing using gauze sponges, not tissue or cotton balls. Prim Dressing: Promogran Prisma Matrix, 4.34 (sq in) (silver collagen)  (Generic) 1 x Per Day/30 Days ary Discharge Instructions: Moisten collagen with saline or hydrogel Secondary Dressing: Woven Gauze Sponge, Non-Sterile 4x4 in (Generic) 1 x Per Day/30 Days Discharge Instructions: Apply over primary dressing as directed. Secured With: 34M Medipore H Soft Cloth Surgical T ape, 4 x 10 (in/yd) (Generic) 1 x Per Day/30 Days Discharge Instructions: Secure with tape as directed. WOUND #14: - T Third Wound Laterality: Left oe Cleanser: Soap and Water 1 x Per Day/30 Days Discharge Instructions: May shower and wash wound with dial antibacterial soap and water prior to dressing change. Cleanser: Wound Cleanser 1 x Per Day/30 Days Discharge Instructions: Cleanse the wound with wound cleanser prior to applying a clean dressing using gauze sponges, not tissue or cotton balls. Prim Dressing: Promogran Prisma Matrix, 4.34 (sq in) (silver collagen) (Generic) 1 x Per Day/30 Days ary Discharge Instructions: Moisten collagen with saline or hydrogel Secondary Dressing: Woven Gauze Sponge, Non-Sterile 4x4 in (Generic) 1 x Per Day/30 Days Discharge Instructions: Apply over primary dressing as directed. Secured With: 34M Medipore H Soft Cloth Surgical T ape, 4 x 10 (in/yd) (Generic) 1 x Per Day/30 Days Discharge Instructions: Secure with tape as directed. WOUND #15: - T Great Wound Laterality: Dorsal, Right oe Cleanser: Soap and Water 1 x Per Day/30 Days Discharge Instructions: May shower and wash wound with dial antibacterial soap and water prior to dressing change. Cleanser: Wound Cleanser 1 x Per Day/30 Days Discharge Instructions: Cleanse the wound with wound cleanser prior to applying a clean dressing using gauze sponges, not tissue or cotton balls. Prim Dressing: Promogran Prisma Matrix, 4.34 (sq in) (silver collagen) (Generic) 1 x Per Day/30 Days ary Discharge Instructions: Moisten collagen with saline or hydrogel Secondary Dressing: Woven Gauze Sponge, Non-Sterile 4x4 in  (Generic) 1 x Per Day/30 Days Discharge Instructions: Apply over primary dressing as directed. Secured With: 34M Medipore H Soft Cloth Surgical T ape, 4 x 10 (in/yd) (Generic) 1 x Per Day/30 Days Discharge Instructions: Secure with tape as directed. WOUND #9: - Calcaneus Wound Laterality: Left Cleanser: Soap and Water 1 x Per Day/30 Days Discharge Instructions: May shower and wash wound with dial antibacterial soap and water prior to dressing change. Cleanser: Wound Cleanser 1 x Per Day/30 Days Discharge Instructions: Cleanse the wound with wound cleanser prior to applying a clean dressing using gauze sponges, not tissue or cotton balls. Prim Dressing: Promogran Prisma Matrix, 4.34 (sq in) (silver collagen) (Generic) 1 x Per Day/30 Days ary Discharge Instructions: Moisten collagen with saline or hydrogel Secondary Dressing: ALLEVYN Heel 4 1/2in x 5 1/2in / 10.5cm x 13.5cm (Generic) 1 x Per Day/30 Days Discharge Instructions: Apply over primary dressing as directed. Secondary Dressing: Woven Gauze Sponge, Non-Sterile 4x4 in (Generic) 1 x Per Day/30 Days Discharge Instructions: Apply over primary dressing as directed. Secured With: 34M Medipore H Soft Cloth Surgical T ape, 4 x 10 (in/yd) (Generic) 1 x Per Day/30 Days Discharge Instructions: Secure with tape as directed. 03/31/2023: All of her wounds have accumulated slough. Bone remains exposed. I used a curette to debride slough off of each of her wounds. I instructed  the patient's daughter to please pick up the antibiotic for treatment of methicillin- resistant Staph aureus that was cultured from her third toe last week. Despite having been maximally revascularized with her procedure in June, I am concerned that she has microvessel disease that is preventing healing and contributing to the formation of new wounds. I will follow-up the results of her vascular studies. We will continue Prisma silver collagen to all of the wound sites. Continue  offloading. Hopefully she will start the antibiotic in short order. Follow-up in 1 week. Electronic Signature(s) Signed: 03/31/2023 3:18:01 PM By: Duanne Guess MD FACS Entered By: Duanne Guess on 03/31/2023 15:18:01 HxROS Details -------------------------------------------------------------------------------- Modena Morrow ([id number]) [id number] Page 16 of 18 Patient Name: Date of Service: [name], [name] 03/31/2023 1:45 PM Medical Record Number: [id number] Patient Account Number: [id number] Date of Birth/Sex: Treating RN: 1928-05-19 (89 y.o. F) Primary Care Provider: [name] Other Clinician: Referring Provider: Treating Provider/Extender: Tiajuana Amass in Treatment: 3 Information Obtained From Patient Caregiver Chart Eyes Medical History: Positive for: Cataracts Hematologic/Lymphatic Medical History: Positive for: Anemia Past Medical History Notes: Thrombocytopenia, Anticoagulant long-term use Respiratory Medical History: Negative for: Chronic Obstructive Pulmonary Disease (COPD) Past Medical History Notes: Pulmonary embolus Cardiovascular Medical History: Positive for: Arrhythmia - a-fib; Congestive Heart Failure; Hypotension; Peripheral Venous Disease Gastrointestinal Medical History: Past Medical History Notes: Hiatal hernia Endocrine Medical History: Past Medical History Notes: Hyperthyroidism, Hypothyroidism Genitourinary Medical History: Past Medical History Notes: CKD stage III Musculoskeletal Medical History: Positive for: 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 Past Medical History Notes: arthritis Neurologic Medical History: Positive for: Neuropathy HBO Extended History Items Eyes: Cataracts Immunizations Pneumococcal Vaccine: Received Pneumococcal Vaccination: No Implantable Devices None Hospitalization / Surgery History Type of Hospitalization/Surgery Budzik, [name]  ([id number]) [id number] Page 17 of 18 Abdominal aortogram w/lower extremity Amputation toe (Right) Colonoscopy Elbow surgery Laparoscopic hysterectomy Family and Social History Cancer: Yes - Siblings; Diabetes: No; Heart Disease: Yes; Stroke: Yes - Mother; Former smoker - quit 2003; Marital Status - Widowed; Alcohol Use: Never; Drug Use: No History; Caffeine Use: Never; Financial Concerns: No; Food, Clothing or Shelter Needs: No; Support System Lacking: No; Transportation Concerns: No Electronic Signature(s) Signed: 03/31/2023 4:18:25 PM By: Duanne Guess MD FACS Entered By: Duanne Guess on 03/31/2023 15:14:27 -------------------------------------------------------------------------------- SuperBill Details Patient Name: Date of Service: [name]. 03/31/2023 Medical Record Number: [id number] Patient Account Number: [id number] Date of Birth/Sex: Treating RN: May 09, 1928 (89 y.o. 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 without evidence of necrosis L97.522 Non-pressure chronic ulcer of other part of left foot with fat layer exposed I73.9 Peripheral vascular disease, unspecified I50.32 Chronic diastolic (congestive) heart failure N18.32 Chronic kidney disease, stage 3b I87.2 Venous insufficiency (chronic) (peripheral) Facility Procedures : CPT4 Code: [id number] Description: 36644 - DEBRIDE WOUND 1ST 20 SQ CM OR < ICD-10 Diagnosis Description L89.623 Pressure ulcer of left heel, stage 3 L97.526 Non-pressure chronic ulcer of other part of left foot  with bone involvement witho L97.516 Non-pressure chronic ulcer of  other part of right foot with bone involvement with L97.522 Non-pressure chronic ulcer of other part of left foot with fat layer exposed Modifier: ut  evidence of necr out evidence of nec Quantity: 1 osis rosis Physician Procedures : CPT4 Code Description Modifier [phone] [id number] - WC PHYS LEVEL 4 - EST PT 25 ICD-10 Diagnosis Description L89.623 Pressure ulcer of left heel, 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 without evidence of 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 Quantity: 1 osis sis : [id number] - WC PHYS DEBR WO ANESTH 20 SQ CM ICD-10 Diagnosis Description L89.623 Pressure ulcer of left heel, stage 3 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 without evidence of necro L97.516 Non-pressure  chronic ulcer of other part of right foot with bone involvement without evidence of necr L97.522 Non-pressure chronic ulcer of other part of left foot with fat layer exposed Kroft, Lazara M ([id number]) [id number] Page Quantity: 1 sis osis 18 of 18 Electronic Signature(s) Signed: 03/31/2023 3:18:24 PM By: Duanne Guess MD FACS Entered By: Duanne Guess on 03/31/2023 15:18:24

## 2023-04-02 ENCOUNTER — Emergency Department (HOSPITAL_COMMUNITY): Payer: Medicare HMO

## 2023-04-02 ENCOUNTER — Other Ambulatory Visit: Payer: Self-pay

## 2023-04-02 ENCOUNTER — Emergency Department (HOSPITAL_COMMUNITY)
Admission: EM | Admit: 2023-04-02 | Discharge: 2023-04-03 | Disposition: A | Discharge status: Home / Self Care | Payer: Medicare HMO | Attending: Emergency Medicine | Admitting: Emergency Medicine

## 2023-04-02 ENCOUNTER — Encounter (HOSPITAL_COMMUNITY): Payer: Self-pay

## 2023-04-02 ENCOUNTER — Ambulatory Visit: Payer: Medicare HMO | Admitting: Physician Assistant

## 2023-04-02 ENCOUNTER — Ambulatory Visit (HOSPITAL_COMMUNITY)
Admission: RE | Admit: 2023-04-02 | Discharge: 2023-04-02 | Disposition: A | Discharge status: Home / Self Care | Payer: Medicare HMO | Source: Ambulatory Visit | Attending: Vascular Surgery | Admitting: Vascular Surgery

## 2023-04-02 VITALS — BP 149/67 | HR 54 | Temp 97.4°F | Resp 18 | Ht 64.0 in | Wt 169.0 lb

## 2023-04-02 DIAGNOSIS — I509 Heart failure, unspecified: Secondary | ICD-10-CM | POA: Diagnosis not present

## 2023-04-02 DIAGNOSIS — I872 Venous insufficiency (chronic) (peripheral): Secondary | ICD-10-CM | POA: Diagnosis not present

## 2023-04-02 DIAGNOSIS — R011 Cardiac murmur, unspecified: Secondary | ICD-10-CM | POA: Insufficient documentation

## 2023-04-02 DIAGNOSIS — I7025 Atherosclerosis of native arteries of other extremities with ulceration: Secondary | ICD-10-CM

## 2023-04-02 DIAGNOSIS — Z7982 Long term (current) use of aspirin: Secondary | ICD-10-CM | POA: Diagnosis not present

## 2023-04-02 DIAGNOSIS — Z20822 Contact with and (suspected) exposure to covid-19: Secondary | ICD-10-CM | POA: Diagnosis not present

## 2023-04-02 DIAGNOSIS — R7989 Other specified abnormal findings of blood chemistry: Secondary | ICD-10-CM | POA: Diagnosis present

## 2023-04-02 DIAGNOSIS — E876 Hypokalemia: Secondary | ICD-10-CM

## 2023-04-02 DIAGNOSIS — I4821 Permanent atrial fibrillation: Secondary | ICD-10-CM | POA: Diagnosis present

## 2023-04-02 DIAGNOSIS — Z7901 Long term (current) use of anticoagulants: Secondary | ICD-10-CM | POA: Insufficient documentation

## 2023-04-02 DIAGNOSIS — E871 Hypo-osmolality and hyponatremia: Secondary | ICD-10-CM | POA: Diagnosis not present

## 2023-04-02 DIAGNOSIS — E039 Hypothyroidism, unspecified: Secondary | ICD-10-CM | POA: Diagnosis present

## 2023-04-02 DIAGNOSIS — R531 Weakness: Secondary | ICD-10-CM | POA: Diagnosis not present

## 2023-04-02 DIAGNOSIS — N1832 Chronic kidney disease, stage 3b: Secondary | ICD-10-CM | POA: Diagnosis present

## 2023-04-02 DIAGNOSIS — I5032 Chronic diastolic (congestive) heart failure: Secondary | ICD-10-CM | POA: Diagnosis present

## 2023-04-02 DIAGNOSIS — I110 Hypertensive heart disease with heart failure: Secondary | ICD-10-CM | POA: Diagnosis not present

## 2023-04-02 DIAGNOSIS — J449 Chronic obstructive pulmonary disease, unspecified: Secondary | ICD-10-CM | POA: Diagnosis present

## 2023-04-02 LAB — CBC WITH DIFFERENTIAL/PLATELET
Abs Immature Granulocytes: 0.03 10*3/uL (ref 0.00–0.07)
Basophils Absolute: 0.1 10*3/uL (ref 0.0–0.1)
Basophils Relative: 1 %
Eosinophils Absolute: 0.1 10*3/uL (ref 0.0–0.5)
Eosinophils Relative: 2 %
HCT: 31 % — ABNORMAL LOW (ref 36.0–46.0)
Hemoglobin: 10.2 g/dL — ABNORMAL LOW (ref 12.0–15.0)
Immature Granulocytes: 0 %
Lymphocytes Relative: 13 %
Lymphs Abs: 1.1 10*3/uL (ref 0.7–4.0)
MCH: 30.9 pg (ref 26.0–34.0)
MCHC: 32.9 g/dL (ref 30.0–36.0)
MCV: 93.9 fL (ref 80.0–100.0)
Monocytes Absolute: 0.6 10*3/uL (ref 0.1–1.0)
Monocytes Relative: 7 %
Neutro Abs: 6.3 10*3/uL (ref 1.7–7.7)
Neutrophils Relative %: 77 %
Platelets: 189 10*3/uL (ref 150–400)
RBC: 3.3 MIL/uL — ABNORMAL LOW (ref 3.87–5.11)
RDW: 14.2 % (ref 11.5–15.5)
WBC: 8.2 10*3/uL (ref 4.0–10.5)
nRBC: 0 % (ref 0.0–0.2)

## 2023-04-02 LAB — COMPREHENSIVE METABOLIC PANEL
ALT: 14 U/L (ref 0–44)
AST: 28 U/L (ref 15–41)
Albumin: 3.1 g/dL — ABNORMAL LOW (ref 3.5–5.0)
Alkaline Phosphatase: 112 U/L (ref 38–126)
Anion gap: 10 (ref 5–15)
BUN: 11 mg/dL (ref 8–23)
CO2: 28 mmol/L (ref 22–32)
Calcium: 8.6 mg/dL — ABNORMAL LOW (ref 8.9–10.3)
Chloride: 88 mmol/L — ABNORMAL LOW (ref 98–111)
Creatinine, Ser: 1.15 mg/dL — ABNORMAL HIGH (ref 0.44–1.00)
GFR, Estimated: 44 mL/min — ABNORMAL LOW (ref >60)
Glucose, Bld: 111 mg/dL — ABNORMAL HIGH (ref 70–99)
Potassium: 2.7 mmol/L — CL (ref 3.5–5.1)
Sodium: 126 mmol/L — ABNORMAL LOW (ref 135–145)
Total Bilirubin: 0.7 mg/dL (ref 0.3–1.2)
Total Protein: 6.5 g/dL (ref 6.5–8.1)

## 2023-04-02 LAB — PHOSPHORUS: Phosphorus: 2.9 mg/dL (ref 2.5–4.6)

## 2023-04-02 LAB — URINALYSIS, ROUTINE W REFLEX MICROSCOPIC
Bilirubin Urine: NEGATIVE
Glucose, UA: NEGATIVE mg/dL
Hgb urine dipstick: NEGATIVE
Ketones, ur: NEGATIVE mg/dL
Leukocytes,Ua: NEGATIVE
Nitrite: NEGATIVE
Protein, ur: NEGATIVE mg/dL
Specific Gravity, Urine: 1.003 — ABNORMAL LOW (ref 1.005–1.030)
pH: 7 (ref 5.0–8.0)

## 2023-04-02 LAB — MAGNESIUM: Magnesium: 1.8 mg/dL (ref 1.7–2.4)

## 2023-04-02 LAB — PROTIME-INR
INR: 2.8 — ABNORMAL HIGH (ref 0.8–1.2)
Prothrombin Time: 29.7 seconds — ABNORMAL HIGH (ref 11.4–15.2)

## 2023-04-02 LAB — SARS CORONAVIRUS 2 BY RT PCR: SARS Coronavirus 2 by RT PCR: NEGATIVE

## 2023-04-02 LAB — VAS US ABI WITH/WO TBI
Left ABI: 0.9
Right ABI: 1.13

## 2023-04-02 LAB — I-STAT CG4 LACTIC ACID, ED: Lactic Acid, Venous: 1.3 mmol/L (ref 0.5–1.9)

## 2023-04-02 MED ORDER — POTASSIUM CHLORIDE 10 MEQ/100ML IV SOLN
10.0000 meq | INTRAVENOUS | Status: AC
  Administered 2023-04-02 (×3): 10 meq via INTRAVENOUS
  Filled 2023-04-02 (×3): qty 100

## 2023-04-02 MED ORDER — POTASSIUM CHLORIDE CRYS ER 20 MEQ PO TBCR: 20.0000 meq | EXTENDED_RELEASE_TABLET | Freq: Every day | ORAL | 0 refills | Status: DC

## 2023-04-02 MED ORDER — POTASSIUM CHLORIDE CRYS ER 20 MEQ PO TBCR
40.0000 meq | EXTENDED_RELEASE_TABLET | Freq: Once | ORAL | Status: AC
  Administered 2023-04-02: 40 meq via ORAL
  Filled 2023-04-02: qty 2

## 2023-04-02 MED ORDER — SODIUM CHLORIDE 0.9 % IV BOLUS
1000.0000 mL | Freq: Once | INTRAVENOUS | Status: AC
  Administered 2023-04-02: 1000 mL via INTRAVENOUS

## 2023-04-02 MED ORDER — BACITRACIN ZINC 500 UNIT/GM EX OINT
TOPICAL_OINTMENT | Freq: Two times a day (BID) | CUTANEOUS | Status: DC
  Administered 2023-04-02: 2 via TOPICAL
  Filled 2023-04-02 (×2): qty 0.9

## 2023-04-02 NOTE — ED Triage Notes (Signed)
Patient presented to ER because of low sodium and chloride per PCP. Na 127 Chl 88. Family endorses confusion and weakness but states she also is on antibiotics for a UTI.

## 2023-04-02 NOTE — Discharge Instructions (Addendum)
You have been evaluated for your symptoms.  You did receive IV fluid as well as potassium supplementation.  Please take potassium as prescribed and follow-up closely with your doctor early next week for recheck of your labs.  You may continue taking antibiotic as previously prescribed.

## 2023-04-02 NOTE — Progress Notes (Signed)
Office Note     CC:  follow up Requesting Provider:  Camie Patience, FNP  HPI: [name] is a 89 y.o. (1927-12-05) female who presents with her daughter for follow up after recent Aortogram. On 03/04/23 she underwent Aortogram, Arteriogram of LLE with balloon angioplasty and shockwave ultrasound assisted balloon angioplasty of her left SFA, popliteal and peroneal artery by Dr. Karin Lieu. She was noted to have single vessel peroneal artery outflow on the left. This procedure was performed due to nonhealing wounds of both feet. On the right she was noted to have diseased popliteal artery with occlusion of the TPT.  No appreciable tibial runoff but suspect due to contrast timing.   She is managed by the wound care clinic and Dr. Lady Gary for her bilateral foot wounds. Her most recent visit was on 03/31/23. Per her daughter she has developed more wounds on the toes although they are getting smaller in size. Having a lot of swelling as well with weeping. Trying to elevate her legs as much as possible. She has had issues with her Sodium levels and is actually headed to hospital after today's visit due to her sodium being too low. She mostly is in wheel chair with limited ambulation. She does not have any claudication or rest pain. She has neuropathy in her feet so decreased sensation.   Past Medical History:  Diagnosis Date   Anticoagulant long-term use    Arrhythmia    Arthritis    Atrial fibrillation (HCC)    Congestive heart failure (CHF) (HCC)    Dysphagia    Esophageal reflux    Essential hypertension    Hiatal hernia    Pulmonary embolus (HCC)    Thyroid disease     Past Surgical History:  Procedure Laterality Date   ABDOMINAL AORTOGRAM W/LOWER EXTREMITY N/A 03/04/2023   Procedure: ABDOMINAL AORTOGRAM W/LOWER EXTREMITY;  Surgeon: [name], MD;  Location: [facility] INVASIVE CV LAB;  Service: Cardiovascular;  Laterality: N/A;   AMPUTATION TOE Right 11/17/2021   Procedure: RIGHT SECOND TOE  AMPUTATION;  Surgeon: [name], MD;  Location: [facility] ORS;  Service: Orthopedics;  Laterality: Right;   COLONOSCOPY     ELBOW SURGERY     LAPAROSCOPIC HYSTERECTOMY      Social History   Socioeconomic History   Marital status: Widowed    Spouse name: Not on file   Number of children: Not on file   Years of education: Not on file   Highest education level: Not on file  Occupational History   Not on file  Tobacco Use   Smoking status: Former    Current packs/day: 0.00    Types: Cigarettes    Quit date: 10/02/2001    Years since quitting: 21.5   Smokeless tobacco: Never   Tobacco comments:    former smoker  Vaping Use   Vaping status: Never Used  Substance and Sexual Activity   Alcohol use: Not on file   Drug use: Not on file   Sexual activity: Not on file  Other Topics Concern   Not on file  Social History Narrative   Not on file   Social Determinants of Health   Financial Resource Strain: Not on file  Food Insecurity: Food Insecurity Present (01/15/2023)   Hunger Vital Sign    Worried About Running Out of Food in the Last Year: Sometimes true    Ran Out of Food in the Last Year: Sometimes true  Transportation Needs: No Transportation Needs (01/15/2023)  PRAPARE - Administrator, Civil Service (Medical): No    Lack of Transportation (Non-Medical): No  Physical Activity: Not on file  Stress: Not on file  Social Connections: Not on file  Intimate Partner Violence: Not At Risk (01/15/2023)   Humiliation, Afraid, Rape, and Kick questionnaire    Fear of Current or Ex-Partner: No    Emotionally Abused: No    Physically Abused: No    Sexually Abused: No    Family History  Problem Relation Age of Onset   Stroke Mother    Cancer Brother     Current Outpatient Medications  Medication Sig Dispense Refill   acetaminophen (TYLENOL) 325 MG tablet Take 2 tablets (650 mg total) by mouth every 6 (six) hours as needed for mild pain or headache. (Patient taking  differently: Take 650 mg by mouth every 6 (six) hours as needed for mild pain, headache or fever.)     aspirin EC 81 MG tablet Take 1 tablet (81 mg total) by mouth daily. Swallow whole. 150 tablet 2   atorvastatin (LIPITOR) 10 MG tablet Take 1 tablet (10 mg total) by mouth daily. 30 tablet 11   bumetanide (BUMEX) 0.5 MG tablet Take 0.5 mg by mouth 2 (two) times daily.     carboxymethylcellulose (REFRESH PLUS) 0.5 % SOLN Place 1 drop into both eyes 2 (two) times daily.     cholecalciferol (VITAMIN D3) 25 MCG (1000 UT) tablet Take 1,000 Units by mouth daily.     diclofenac Sodium (VOLTAREN) 1 % GEL Apply 4 g topically 4 (four) times daily. To bilateral feet and knees (Patient taking differently: Apply 4 g topically 4 (four) times daily as needed (pain). To bilateral feet and knees)     gabapentin (NEURONTIN) 300 MG capsule Take 300 mg by mouth 2 (two) times daily.      levothyroxine (SYNTHROID) 100 MCG tablet Take 100 mcg by mouth daily before breakfast.     Multiple Vitamins-Minerals (PRESERVISION/LUTEIN) CAPS Take 1 capsule by mouth 2 (two) times daily.      OVER THE COUNTER MEDICATION Take 1 capsule by mouth at bedtime. Equate Neuriva     pantoprazole (PROTONIX) 40 MG tablet Take 40 mg by mouth daily.     polyethylene glycol powder (MIRALAX) 17 GM/SCOOP powder Take 17 g by mouth 2 (two) times daily as needed for moderate constipation. 255 g 0   senna-docusate (SENOKOT-S) 8.6-50 MG tablet Take 1 tablet by mouth 2 (two) times daily between meals as needed for mild constipation. (Patient taking differently: Take 1 tablet by mouth at bedtime.) 60 tablet 0   spironolactone (ALDACTONE) 25 MG tablet Take 0.5 tablets (12.5 mg total) by mouth daily. 45 tablet 3   vitamin B-12 (CYANOCOBALAMIN) 1000 MCG tablet Take 1,000 mcg by mouth daily.     warfarin (COUMADIN) 1 MG tablet Take 1-2 mg by mouth as directed. Take 2 tablets (2 mg) (Monday thru Friday) & Take 1 tablet (1 mg) on (Saturday & Sunday)      potassium chloride SA (KLOR-CON M) 20 MEQ tablet Take 1 tablet (20 mEq total) by mouth daily. 3 tablet 0   No current facility-administered medications for this visit.   Facility-Administered Medications Ordered in Other Visits  Medication Dose Route Frequency Provider Last Rate Last Admin   iodixanol (VISIPAQUE) 320 MG/ML injection    PRN [name], MD   50 mL at 03/04/23 1545    Allergies  Allergen Reactions   Amoxicillin Other (See Comments)  Unknown reaction  Tolerates Keflex, Cefepime   Azithromycin Other (See Comments)    Unknown reaction    Codeine Other (See Comments)    Unknown reaction    Erythromycin Other (See Comments)    Unknown reaction    Green Dyes Other (See Comments)    Allergic to ALL dyes   Iodine Other (See Comments)    Unknown reaction    Misc. Sulfonamide Containing Compounds    Oxycodone Other (See Comments)    Unknown reaction      Oxycodone-Acetaminophen Other (See Comments)    Unknown reaction    Penicillins Other (See Comments)    Unknown reaction  Tolerates Keflex, Cefepime   Sulfa Antibiotics     Per Pt's daughter - unknown reaction    Sulfasalazine     Unsure of allergy    Tramadol Hives     REVIEW OF SYSTEMS:  [X]  denotes positive finding, [ ]  denotes negative finding Cardiac  Comments:  Chest pain or chest pressure:    Shortness of breath upon exertion:    Short of breath when lying flat:    Irregular heart rhythm:        Vascular    Pain in calf, thigh, or hip brought on by ambulation:    Pain in feet at night that wakes you up from your sleep:     Blood clot in your veins:    Leg swelling:         Pulmonary    Oxygen at home:    Productive cough:     Wheezing:         Neurologic    Sudden weakness in arms or legs:     Sudden numbness in arms or legs:     Sudden onset of difficulty speaking or slurred speech:    Temporary loss of vision in one eye:     Problems with dizziness:         Gastrointestinal     Blood in stool:     Vomited blood:         Genitourinary    Burning when urinating:     Blood in urine:        Psychiatric    Major depression:         Hematologic    Bleeding problems:    Problems with blood clotting too easily:        Skin    Rashes or ulcers:        Constitutional    Fever or chills:      PHYSICAL EXAMINATION:  Vitals:   04/02/23 1344  BP: (!) 149/67  Pulse: (!) 54  Resp: 18  Temp: (!) 97.4 F (36.3 C)  TempSrc: Temporal  SpO2: 98%  Weight: 169 lb (76.7 kg)  Height: 5\' 4"  (1.626 m)    General:  WDWN in NAD; vital signs documented above Gait: Not observed, in wheel chair HENT: WNL, normocephalic Pulmonary: normal non-labored breathing without  wheezing Cardiac: regular HR Abdomen: soft Vascular Exam/Pulses: 2+ femoral pulses Extremities: without ischemic changes, without Gangrene , without cellulitis; with open wound of bilateral toes. Toes are dressed. I did not remove these as they had collagen and medicated dressings in place Musculoskeletal: no muscle wasting or atrophy  Neurologic: A&O X 3 Psychiatric:  The pt has Normal affect.   Non-Invasive Vascular Imaging:   +-------+----------------+-----------+------------+------------+  ABI/TBIToday's ABI     Today's TBIPrevious ABIPrevious TBI  +-------+----------------+-----------+------------+------------+  Right 1.13 (calcified)bandages   0.48  0.39          +-------+----------------+-----------+------------+------------+  Left  0.90 (calcified)bandages   0.46        0.21          +-------+----------------+-----------+------------+------------+    ASSESSMENT/PLAN:: 89 y.o. female here for follow up for peripheral artery disease with bilateral foot wounds. On 03/04/23 she underwent Aortogram, Arteriogram of LLE with balloon angioplasty and shockwave ultrasound assisted balloon angioplasty of her left SFA, popliteal and peroneal artery by Dr. Karin Lieu.  She has Doppler  left PT and Peroneal signals. Right faint Pero signal. She has no further revascularization options on the LLE. It is optimized from vascular standpoint. On the right she has known occlusion of the TPT. Unable to visualize tibial vessels well on angiogram. She would need more formal RLE arteriogram. Given renal insufficiency will hold off at this time to avoid further contrast use in a short interim.  - Continue diligent wound care - Will make referral to [facility] for Offloading shoes - Patient and her daughter know to call for earlier follow up if wounds progress - Will arrange follow up in 1 month with Dr. Edilia Bo or Dr. Genia Del, PA-C [facility] [phone]  Clinic MD:   Edilia Bo

## 2023-04-02 NOTE — ED Provider Notes (Signed)
[place] EMERGENCY DEPARTMENT AT [facility] Provider Note   CSN: [id number] Arrival date & time: 04/02/23  1615     History  Chief Complaint  Patient presents with   Abnormal Lab    [name] is a 89 y.o. female.  The history is provided by the patient and medical records. No language interpreter was used.  Abnormal Lab    89 year old female with multiple comorbidities which includes history of PE currently on warfarin, A-fib, thyroid disease, hypertension, CHF, reflux presents ED with concerns of confusion and generalized weakness.  Family member report patient is currently being treated for a UTI and foot infection and is on antibiotic.  Per daughter, patient had a physical approximately month ago.  At that time she was noted to have some abnormal lab values.  She had it with  redrawn recently and today office contacted stating that patient has some abnormal lab values and need to come to the ER.  Furthermore, patient has vascular disease and is currently being managed by vascular surgeon.  She has  problem with peripheral vein surgery several weeks ago and she has some wounds on her toes on both feet requiring wound care.  She was also told that she had a UTI and office recommend initially taking levofloxacin but now switching over to doxycycline for which she has been taking for the past 2 days.  Patient currently without any specific complaint.  She does not complain of any fever chills nausea vomiting diarrhea having any abdominal pain or dysuria.  Daughter states patient has been feeling nauseous and having been eating or drinking quite as much like her usual self.  She is a DNR status.  She does wear supplemental oxygen at home.  Home Medications Prior to Admission medications   Medication Sig Start Date End Date Taking? Authorizing Provider  acetaminophen (TYLENOL) 325 MG tablet Take 2 tablets (650 mg total) by mouth every 6 (six) hours as needed for mild pain or  headache. Patient taking differently: Take 650 mg by mouth every 6 (six) hours as needed for mild pain, headache or fever. 11/06/22   Almon Hercules, MD  aspirin EC 81 MG tablet Take 1 tablet (81 mg total) by mouth daily. Swallow whole. 03/04/23 03/03/24  [name], MD  atorvastatin (LIPITOR) 10 MG tablet Take 1 tablet (10 mg total) by mouth daily. 03/04/23 03/03/24  [name], MD  bumetanide (BUMEX) 0.5 MG tablet Take 0.5 mg by mouth 2 (two) times daily.    [provider]  carboxymethylcellulose (REFRESH PLUS) 0.5 % SOLN Place 1 drop into both eyes 2 (two) times daily.    [provider]  cholecalciferol (VITAMIN D3) 25 MCG (1000 UT) tablet Take 1,000 Units by mouth daily.    [provider]  diclofenac Sodium (VOLTAREN) 1 % GEL Apply 4 g topically 4 (four) times daily. To bilateral feet and knees Patient taking differently: Apply 4 g topically 4 (four) times daily as needed (pain). To bilateral feet and knees 11/06/22   Almon Hercules, MD  gabapentin (NEURONTIN) 300 MG capsule Take 300 mg by mouth 2 (two) times daily.     [provider]  levothyroxine (SYNTHROID) 100 MCG tablet Take 100 mcg by mouth daily before breakfast. 09/05/21   [provider]  Multiple Vitamins-Minerals (PRESERVISION/LUTEIN) CAPS Take 1 capsule by mouth 2 (two) times daily.     [provider]  OVER THE COUNTER MEDICATION Take 1 capsule by  mouth at bedtime. Equate Neuriva    [provider]  pantoprazole (PROTONIX) 40 MG tablet Take 40 mg by mouth daily.    [provider]  polyethylene glycol powder (MIRALAX) 17 GM/SCOOP powder Take 17 g by mouth 2 (two) times daily as needed for moderate constipation. 11/06/22   Almon Hercules, MD  senna-docusate (SENOKOT-S) 8.6-50 MG tablet Take 1 tablet by mouth 2 (two) times daily between meals as needed for mild constipation. Patient taking differently: Take 1 tablet by mouth at bedtime. 11/06/22   Almon Hercules, MD  spironolactone (ALDACTONE) 25 MG tablet Take 0.5 tablets (12.5 mg total) by mouth daily. 12/19/22   Alver Sorrow, NP  vitamin B-12 (CYANOCOBALAMIN) 1000 MCG tablet Take 1,000 mcg by mouth daily.    [provider]  warfarin (COUMADIN) 1 MG tablet Take 1-2 mg by mouth as directed. Take 2 tablets (2 mg) (Monday thru Friday) & Take 1 tablet (1 mg) on (Saturday & Sunday)    [provider]      Allergies    Amoxicillin, Azithromycin, Codeine, Erythromycin, Green dyes, Iodine, Misc. sulfonamide containing compounds, Oxycodone, Oxycodone-acetaminophen, Penicillins, Sulfa antibiotics, Sulfasalazine, and Tramadol    Review of Systems   Review of Systems  All other systems reviewed and are negative.   Physical Exam Updated Vital Signs BP (!) 123/57 (BP Location: Left Arm)   Pulse (!) 59   Temp (!) 97.4 F (36.3 C) (Oral)   Resp 16   Ht 5\' 4"  (1.626 m)   Wt 76.6 kg   SpO2 100%   BMI 28.99 kg/m  Physical Exam Vitals and nursing note reviewed.  Constitutional:      General: She is not in acute distress.    Appearance: She is well-developed.     Comments: Patient with supplemental oxygen appears to be in no acute respiratory discomfort.  HENT:     Head: Atraumatic.  Eyes:     Conjunctiva/sclera: Conjunctivae normal.  Cardiovascular:     Rate and Rhythm: Normal rate and regular rhythm.     Pulses: Normal pulses.     Heart sounds: Murmur heard.  Pulmonary:     Effort: Pulmonary effort is normal.  Abdominal:     Palpations: Abdomen is soft.     Tenderness: There is no abdominal tenderness.  Musculoskeletal:     Cervical back: Neck supple.  Skin:    Findings: No rash.     Comments: Patient has several pressure ulcers noted to her heels and her toes on both feet.  She does have palpable dorsalis pedis pulses.  Neurological:     Mental Status: She is alert. Mental status is at baseline.  Psychiatric:        Mood and Affect: Mood normal.     ED  Results / Procedures / Treatments   Labs (all labs ordered are listed, but only abnormal results are displayed) Labs Reviewed  CBC WITH DIFFERENTIAL/PLATELET - Abnormal; Notable for the following components:      Result Value   RBC 3.30 (*)    Hemoglobin 10.2 (*)    HCT 31.0 (*)    All other components within normal limits  COMPREHENSIVE METABOLIC PANEL - Abnormal; Notable for the following components:   Sodium 126 (*)    Potassium 2.7 (*)    Chloride 88 (*)    Glucose, Bld 111 (*)    Creatinine, Ser 1.15 (*)    Calcium 8.6 (*)    Albumin 3.1 (*)  GFR, Estimated 44 (*)    All other components within normal limits  URINALYSIS, ROUTINE W REFLEX MICROSCOPIC - Abnormal; Notable for the following components:   Color, Urine STRAW (*)    Specific Gravity, Urine 1.003 (*)    All other components within normal limits  PROTIME-INR - Abnormal; Notable for the following components:   Prothrombin Time 29.7 (*)    INR 2.8 (*)    All other components within normal limits  SARS CORONAVIRUS 2 BY RT PCR  URINE CULTURE  MAGNESIUM  PHOSPHORUS  I-STAT CG4 LACTIC ACID, ED    EKG None  Radiology DG Chest Portable 1 View  Result Date: 04/02/2023 CLINICAL DATA:  Weakness EXAM: PORTABLE CHEST 1 VIEW COMPARISON:  Chest radiograph 01/15/2023 FINDINGS: No significant change from 01/15/2023. Stable cardiomegaly. Aortic atherosclerotic calcification. Pulmonary vascular congestion. Small bilateral pleural effusions or pleural thickening. No focal consolidation or pneumothorax. Advanced arthritis both shoulders. IMPRESSION: Cardiomegaly and pulmonary vascular congestion. Small bilateral pleural effusions or pleural thickening. Electronically Signed   By: Minerva Fester M.D.   On: 04/02/2023 20:06   VAS [place] ABI WITH/WO TBI  Result Date: 04/02/2023  LOWER EXTREMITY DOPPLER STUDY Patient Name:  ARLINA SABINA  Date of Exam:   04/02/2023 Medical Rec #: [id number]      Accession #:    [id number] Date of Birth:  11/06/1927       Patient Gender: F Patient Age:   37 years Exam Location:  [facility] Procedure:      VAS [place] ABI WITH/WO TBI Referring Phys: --------------------------------------------------------------------------------  Indications: Ulceration, and peripheral artery disease. High Risk Factors: Hypertension, hyperlipidemia.  Vascular Interventions: 11/17/2021: Right second toe amputation. Limitations: Today's exam was limited due to bandages. Performing Technologist: Dorthula Matas RVS, RCS  Examination Guidelines: A complete evaluation includes at minimum, Doppler waveform signals and systolic blood pressure reading at the level of bilateral brachial, anterior tibial, and posterior tibial arteries, when vessel segments are accessible. Bilateral testing is considered an integral part of a complete examination. Photoelectric Plethysmograph (PPG) waveforms and toe systolic pressure readings are included as required and additional duplex testing as needed. Limited examinations for reoccurring indications may be performed as noted.  ABI Findings: +--------+------------------+-----+----------+--------+ Right   Rt Pressure (mmHg)IndexWaveform  Comment  +--------+------------------+-----+----------+--------+ [id number]                                       +--------+------------------+-----+----------+--------+ PTA     149               0.94 monophasic         +--------+------------------+-----+----------+--------+ DP      178               1.13 monophasic         +--------+------------------+-----+----------+--------+ +--------+------------------+-----+----------+-------+ Left    Lt Pressure (mmHg)IndexWaveform  Comment +--------+------------------+-----+----------+-------+ [id number]                                      +--------+------------------+-----+----------+-------+ PTA     156               0.99 monophasic         +--------+------------------+-----+----------+-------+ DP      126               0.80 monophasic        +--------+------------------+-----+----------+-------+ +-------+----------------+-----------+------------+------------+  ABI/TBIToday's ABI     Today's TBIPrevious ABIPrevious TBI +-------+----------------+-----------+------------+------------+ Right  1.13 (calcified)bandages   0.48        0.39         +-------+----------------+-----------+------------+------------+ Left   0.90 (calcified)bandages   0.46        0.21         +-------+----------------+-----------+------------+------------+  Summary: Right: Ankle brachial index is likely overestimated due to the presence of calcified vessels. Monophasic waveforms.  Left: Ankle brachial index is likely overestimated due to the presence of calcified vessels. Monophasic waveforms.  *See table(s) above for measurements and observations.  Electronically signed by Waverly Ferrari MD on 04/02/2023 at 1:43:17 PM.    Final     Procedures Procedures    Medications Ordered in ED Medications  potassium chloride 10 mEq in 100 mL IVPB (10 mEq Intravenous New Bag/Given 04/02/23 2015)  potassium chloride SA (KLOR-CON M) CR tablet 40 mEq (40 mEq Oral Given 04/02/23 1856)  sodium chloride 0.9 % bolus 1,000 mL (1,000 mLs Intravenous New Bag/Given 04/02/23 1837)    ED Course/ Medical Decision Making/ A&P                             Medical Decision Making Amount and/or Complexity of Data Reviewed Labs: ordered. Radiology: ordered. ECG/medicine tests: ordered.  Risk OTC drugs. Prescription drug management.   BP (!) 123/57 (BP Location: Left Arm)   Pulse (!) 59   Temp (!) 97.4 F (36.3 C) (Oral)   Resp 16   Ht 5\' 4"  (1.626 m)   Wt 76.6 kg   SpO2 100%   BMI 28.99 kg/m   76:63 PM 89 year old female with multiple comorbidities which includes history of PE currently on warfarin, A-fib, thyroid disease, hypertension, CHF, reflux  presents ED with concerns of confusion and generalized weakness.  Family member report patient is currently being treated for a UTI and foot infection and is on antibiotic.  Per daughter, patient had a physical approximately month ago.  At that time she was noted to have some abnormal lab values.  She had it with  redrawn recently and today office contacted stating that patient has some abnormal lab values and need to come to the ER.  Furthermore, patient has vascular disease and is currently being managed by vascular surgeon.  She has  problem with peripheral vein surgery several weeks ago and she has some wounds on her toes on both feet requiring wound care.  She was also told that she had a UTI and office recommend initially taking levofloxacin but now switching over to doxycycline for which she has been taking for the past 2 days.  Patient currently without any specific complaint.  She does not complain of any fever chills nausea vomiting diarrhea having any abdominal pain or dysuria.  Daughter states patient has been feeling nauseous and having been eating or drinking quite as much like her usual self.  She is a DNR status.  She does wear supplemental oxygen at home.  On exam, this is a well-appearing elderly female resting company in bed appears to be in no acute discomfort.  She does wear supplemental oxygen.  Heart with normal rate and rhythm, lungs without overt wheezes rales or rhonchi.  Abdomen is soft nontender.  Patient does have some pressure ulcers noted to her toes as well as her heels.  -Labs ordered, independently viewed and interpreted by me.  Labs remarkable for Na+ 126, K+  2.7, Cl 88.  IVF and potassium supplementation started.   -The patient was maintained on a cardiac monitor.  I personally viewed and interpreted the cardiac monitored which showed an underlying rhythm of: irregularly irregular -Imaging independently viewed and interpreted by me and I agree with radiologist's  interpretation.  Result remarkable for INR 2.8. Na+ 126, IVF given.  K+ 2.7 supplementation given.  UA without evidence of UTI -This patient presents to the ED for concern of abnormal labs, this involves an extensive number of treatment options, and is a complaint that carries with it a high risk of complications and morbidity.  The differential diagnosis includes electrolytes derangement, infection,  -Co morbidities that complicate the patient evaluation includes CHF, afib, HTN -Treatment includes Na+ and K+ supplementation -Reevaluation of the patient after these medicines showed that the patient improved -PCP office notes or outside notes reviewed -Discussion with specialist attending Dr. Silverio Lay and Hospitalist Dr. Cyndia Bent who felt pt can be d/c home and have labs recheck.   -Escalation to admission/observation considered: patients feels much better, is comfortable with discharge, and will follow up with PCP -Prescription medication considered, patient comfortable with Potassium supplementation -Social Determinant of Health considered which includes food insecurity  10:09 PM Pt sign out to oncoming team, dispo after finishing with potassium infusion       Final Clinical Impression(s) / ED Diagnoses Final diagnoses:  Hypokalemia  Hyponatremia    Rx / DC Orders ED Discharge Orders          Ordered    potassium chloride SA (KLOR-CON M) 20 MEQ tablet  Daily        04/02/23 2115              Fayrene Helper, PA-C 04/02/23 2209    Charlynne Pander, MD 04/02/23 2308

## 2023-04-02 NOTE — ED Notes (Signed)
Pt was given a sandwich and something to drink

## 2023-04-06 ENCOUNTER — Encounter (HOSPITAL_BASED_OUTPATIENT_CLINIC_OR_DEPARTMENT_OTHER): Payer: Self-pay | Admitting: Cardiology

## 2023-04-06 ENCOUNTER — Telehealth (INDEPENDENT_AMBULATORY_CARE_PROVIDER_SITE_OTHER): Payer: Medicare HMO | Admitting: Cardiology

## 2023-04-06 VITALS — BP 138/60 | HR 66 | Ht 64.0 in | Wt 156.0 lb

## 2023-04-06 DIAGNOSIS — Z7189 Other specified counseling: Secondary | ICD-10-CM

## 2023-04-06 DIAGNOSIS — I872 Venous insufficiency (chronic) (peripheral): Secondary | ICD-10-CM

## 2023-04-06 DIAGNOSIS — I5032 Chronic diastolic (congestive) heart failure: Secondary | ICD-10-CM

## 2023-04-06 DIAGNOSIS — I4821 Permanent atrial fibrillation: Secondary | ICD-10-CM

## 2023-04-06 DIAGNOSIS — Z7901 Long term (current) use of anticoagulants: Secondary | ICD-10-CM

## 2023-04-06 DIAGNOSIS — I272 Pulmonary hypertension, unspecified: Secondary | ICD-10-CM | POA: Diagnosis not present

## 2023-04-06 DIAGNOSIS — D6869 Other thrombophilia: Secondary | ICD-10-CM

## 2023-04-06 NOTE — Progress Notes (Signed)
Virtual Visit via Telephone Note   Because of [name]'s co-morbid illnesses, she is at least at moderate risk for complications without adequate follow up.  This format is felt to be most appropriate for this patient at this time.  The patient did not have access to video technology/had technical difficulties with video requiring transitioning to audio format only (telephone).  All issues noted in this document were discussed and addressed.  No physical exam could be performed with this format.  Please refer to the patient's chart for her consent to telehealth for [facility].   The patient was identified using 2 identifiers.  Patient Location: Home Provider Location: Office/Clinic   Date:  04/06/2023   ID:  [name], DOB Jul 02, 1928, MRN [id number]  PCP:  Camie Patience, FNP  Cardiologist:  [name], MD PhD  Referring MD: Camie Patience, FNP   CC: follow up  History of Present Illness: .   ALAISHA [name] is a 89 y.o. female with a hx of atrial fibrillation on coumadin, essential hypertension, pulmonary hypertension, low flow low gradient aortic stenosis, PAD with wound, and chronic diastolic heart failure, here for follow up. Her initial consult (telemedicine) was 11/25/18.    Cardiac history: Previously followed by Dr. Orvis Brill at [facility] (notes under media tab). Per notes:  -echo 2014, lexiscan 2015 low risk,  -echo 2019 hyperdynamic EF, septal flattening, severe RV dilation, RVSP noted as critical (near systemic) pulmonary hypertension, severe biatrial enlargement, dilated coronary sinus ?persistent left SVC.  -Venous study 2019 with bilateral mild insufficiency -Echo 11/10/17 with hyperdynamic EF, septal flattening, severe RV dilation, severe TR, severe pulmonary hypertension (listed at 71 mmHg in note) -Echo 12/19/22 with EF 65-70%, RVSP 68.6, mod-severe TR, moderate low flow low gradient aortic stenosis (prior echo 10/25/22 noted severe low  flow low gradient but there was contamination from TR) -s/p peripheral angiography and angioplasty 03/04/23 to left SFA, popliteal, peroneal arteries   Today: History predominantly provided by patient's daughter [name].   Reviewed recent ER visit 04/02/23 for generalized weakness. Found to have Na 126, Cl 88, and K 2.7. Was currently being treated for UTI and foot infection. Per daughter, r foot has three sores on toes that are new since her peripheral angiogram.   They have to work on making sure she stays hydrated, but they feel that she has been stable with this. Going to PCP tomorrow for lab check and PCP visit.  Swelling has been generally well controlled. Occasional worsening edema. Tries to keep feet partially elevated. Weight has been trending down but appetite chronically low.   We discussed goals of care, home health also brought up hospice recently. They do not want to stop therapy, which is why they have not pursued hospice to this point.  ROS: ROS otherwise negative except as noted.   Studies Reviewed: [name] Kitchen    EKG:     (virtual visit, not ordered)  Physical Exam:   VS:  BP 138/60   Pulse 66   Ht 5\' 4"  (1.626 m)   Wt 156 lb (70.8 kg)   SpO2 99%   BMI 26.78 kg/m    Wt Readings from Last 3 Encounters:  04/06/23 156 lb (70.8 kg)  04/02/23 168 lb 14 oz (76.6 kg)  04/02/23 169 lb (76.7 kg)    Speaking comfortably on the phone, no audible wheezing In no acute distress Alert and oriented Normal affect Normal speech   ASSESSMENT AND PLAN: .  Low flow low gradient aortic stenosis -initially reported as severe, then re-evaluated and noted as moderate -reviewed what this is, symptoms it causes -patient does not want to undergo TAVR at this time, defers referral. With her comorbidities, unclear that she would be able to tolerate TAVR and she could not tolerate open AVR. They agree and do not wish to pursue at this time.  Peripheral edema 2/2 severe pulmonary hypertension  and chronic diastolic heart failure -Daughter very involved in her care. Weights and symptoms stable. -Continue bumex and spironolactone as noted -continue daily weights, leg elevation, compression stockings, salt avoidance -we have extensively discussed her pulmonary hypertension, see prior notes  Lower extremity wounds PAD -following with Dr. Edilia Bo and Dr. Karin Lieu -nonhealing lower extremity wounds, s/p peripheral angiogram. Being treat for infection, followed by wound care  Hypertension: -continue diuretics as above  Atrial fibrillation, permanent Slow ventricular response -CHA2DS2/VAS=at least 5 -on coumadin, managed per PCP.   Electrolyte abnormalities -hyponatremia, hypokalemia, hypochloremia -follow up labs with pcp  Goals of care: see HPI, family not ready for hospice at this time. She is DNR.  Plan for follow up: 6 weeks, virtual ok  Today, I have spent 43 minutes with the patient with telehealth technology discussing the above problems.  Additional time spent in chart review, documentation, and communication.   Signed, [name], MD   [name], MD, PhD, [facility]  [facility]  [place]  [facility] at [facility] [address] [place], [place] [address] [phone]

## 2023-04-06 NOTE — Patient Instructions (Signed)
Medication Instructions:  Your physician recommends that you continue on your current medications as directed. Please refer to the Current Medication list given to you today.   Labwork: NONE  Testing/Procedures: NONE  Follow-Up: 05/20/2023 TELEPHONE VISIT 2:20 PM

## 2023-04-08 ENCOUNTER — Ambulatory Visit (HOSPITAL_BASED_OUTPATIENT_CLINIC_OR_DEPARTMENT_OTHER): Payer: Medicare HMO | Admitting: General Surgery

## 2023-04-09 ENCOUNTER — Encounter (HOSPITAL_BASED_OUTPATIENT_CLINIC_OR_DEPARTMENT_OTHER): Payer: Medicare HMO | Attending: General Surgery | Admitting: General Surgery

## 2023-04-09 ENCOUNTER — Other Ambulatory Visit (HOSPITAL_BASED_OUTPATIENT_CLINIC_OR_DEPARTMENT_OTHER): Payer: Self-pay | Admitting: Family

## 2023-04-09 DIAGNOSIS — I872 Venous insufficiency (chronic) (peripheral): Secondary | ICD-10-CM | POA: Insufficient documentation

## 2023-04-09 DIAGNOSIS — D649 Anemia, unspecified: Secondary | ICD-10-CM | POA: Diagnosis not present

## 2023-04-09 DIAGNOSIS — I4891 Unspecified atrial fibrillation: Secondary | ICD-10-CM | POA: Diagnosis not present

## 2023-04-09 DIAGNOSIS — I5032 Chronic diastolic (congestive) heart failure: Secondary | ICD-10-CM | POA: Diagnosis not present

## 2023-04-09 DIAGNOSIS — I130 Hypertensive heart and chronic kidney disease with heart failure and stage 1 through stage 4 chronic kidney disease, or unspecified chronic kidney disease: Secondary | ICD-10-CM | POA: Diagnosis not present

## 2023-04-09 DIAGNOSIS — I771 Stricture of artery: Secondary | ICD-10-CM | POA: Insufficient documentation

## 2023-04-09 DIAGNOSIS — N1832 Chronic kidney disease, stage 3b: Secondary | ICD-10-CM | POA: Diagnosis not present

## 2023-04-09 DIAGNOSIS — Z7901 Long term (current) use of anticoagulants: Secondary | ICD-10-CM | POA: Insufficient documentation

## 2023-04-09 DIAGNOSIS — I739 Peripheral vascular disease, unspecified: Secondary | ICD-10-CM | POA: Insufficient documentation

## 2023-04-09 DIAGNOSIS — I272 Pulmonary hypertension, unspecified: Secondary | ICD-10-CM

## 2023-04-09 DIAGNOSIS — L97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 without evidence of necrosis: Secondary | ICD-10-CM | POA: Insufficient documentation

## 2023-04-09 DIAGNOSIS — L89623 Pressure ulcer of left heel, stage 3: Secondary | ICD-10-CM | POA: Diagnosis not present

## 2023-04-09 NOTE — Progress Notes (Addendum)
[name], Megan ([id number]) [id number] Page 1 of 19 Visit Report for 04/09/2023 Chief Complaint Document Details Patient Name: Date of Service: [name], [name] 04/09/2023 2:45 PM Medical Record Number: [id number] Patient Account Number: [id number] Date of Birth/Sex: Treating RN: 1927-11-03 (89 y.o. F) Primary Care Provider: Jorge [name] Other Clinician: Referring Provider: Treating Provider/Extender: [name] Weeks in Treatment: 4 Information Obtained from: Patient Chief Complaint 01/28/2022; right second toe amputation site dehiscence and bilateral lower extremity wounds. 03/10/2023: pressure ulcers of right heel, ulcers on toes Electronic Signature(s) Signed: 04/09/2023 3:18:56 PM By: Megan Guess MD FACS Entered By: [name] on 04/09/2023 15:18:56 -------------------------------------------------------------------------------- Debridement Details Patient Name: Date of Service: [name]. 04/09/2023 2:45 PM Medical Record Number: [id number] Patient Account Number: [id number] Date of Birth/Sex: Treating RN: May 12, 1928 (89 y.o. [name], [name] Primary Care Provider: Jorge [name] Other Clinician: Referring Provider: Treating Provider/Extender: [name] Weeks in Treatment: 4 Debridement Performed for Assessment: Wound #13 Left,Dorsal T Great oe Performed By: Physician Megan Guess, MD Debridement Type: Debridement Severity of Tissue Pre Debridement: Fat layer exposed Level of Consciousness (Pre-procedure): Awake and Alert Pre-procedure Verification/Time Out Yes - 15:20 Taken: Start Time: 15:23 Pain Control: Lidocaine 4% Topical Solution Percent of Wound Bed Debrided: 100% T Area Debrided (cm): otal 0.52 Tissue and other material debrided: Non-Viable, Eschar, Slough, Slough Level: Non-Viable Tissue Debridement Description: Selective/Open Wound Instrument: Curette Bleeding:  Minimum Hemostasis Achieved: Pressure Procedural Pain: 2 Post Procedural Pain: 1 Response to Treatment: Procedure was tolerated well Level of Consciousness (Post- Awake and Alert procedure): Post Debridement Measurements of Total Wound Length: (cm) 0.6 Width: (cm) 1.1 Depth: (cm) 0.2 Volume: (cm) 0.104 [name], [name] ([id number]) [id number] Page 2 of 19 Character of Wound/Ulcer Post Debridement: Improved Severity of Tissue Post Debridement: Bone involvement without necrosis Post Procedure Diagnosis Same as Pre-procedure Notes scribed for Dr. Lady [name] by Megan Deed, RN Electronic Signature(s) Signed: 04/09/2023 3:45:57 PM By: Megan Guess MD FACS Signed: 04/09/2023 4:06:41 PM By: Megan Deed RN, BSN Entered By: [name] on 04/09/2023 15:28:38 -------------------------------------------------------------------------------- Debridement Details Patient Name: Date of Service: [name]. 04/09/2023 2:45 PM Medical Record Number: [id number] Patient Account Number: [id number] Date of Birth/Sex: Treating RN: 1928/01/07 (89 y.o. [name], [name] Primary Care Provider: Jorge [name] Other Clinician: Referring Provider: Treating Provider/Extender: [name] Weeks in Treatment: 4 Debridement Performed for Assessment: Wound #12 Left T Great oe Performed By: Physician Megan Guess, MD Debridement Type: Debridement Severity of Tissue Pre Debridement: Fat layer exposed Level of Consciousness (Pre-procedure): Awake and Alert Pre-procedure Verification/Time Out Yes - 15:20 Taken: Start Time: 15:23 Pain Control: Lidocaine 4% Topical Solution Percent of Wound Bed Debrided: 100% T Area Debrided (cm): otal 0.16 Tissue and other material debrided: Non-Viable, Eschar, Skin: Epidermis Level: Skin/Epidermis Debridement Description: Selective/Open Wound Instrument: Curette Bleeding: Minimum Hemostasis Achieved:  Pressure Procedural Pain: 2 Post Procedural Pain: 1 Response to Treatment: Procedure was tolerated well Level of Consciousness (Post- Awake and Alert procedure): Post Debridement Measurements of Total Wound Length: (cm) 0.4 Width: (cm) 0.5 Depth: (cm) 0.1 Volume: (cm) 0.016 Character of Wound/Ulcer Post Debridement: Improved Severity of Tissue Post Debridement: Fat layer exposed Post Procedure Diagnosis Same as Pre-procedure Notes scribed for Dr. Lady [name] by Megan Deed, RN Electronic Signature(s) Signed: 04/09/2023 3:45:57 PM By: Megan Guess MD FACS Signed: 04/09/2023 4:06:41 PM By: Megan Deed RN, BSN Entered By: [name] on 04/09/2023 15:29:30 [name], [name] ([id number]) [id number] Page 3 of 19 -------------------------------------------------------------------------------- Debridement  Details Patient Name: Date of Service: [name], [name] 04/09/2023 2:45 PM Medical Record Number: [id number] Patient Account Number: [id number] Date of Birth/Sex: Treating RN: 01-May-1928 (89 y.o. [name] Primary Care Provider: Jorge [name] Other Clinician: Referring Provider: Treating Provider/Extender: [name] Weeks in Treatment: 4 Debridement Performed for Assessment: Wound #9 Left Calcaneus Performed By: Physician Megan Guess, MD Debridement Type: Debridement Level of Consciousness (Pre-procedure): Awake and Alert Pre-procedure Verification/Time Out Yes - 15:20 Taken: Start Time: 15:23 Pain Control: Lidocaine 4% T opical Solution Percent of Wound Bed Debrided: 100% T Area Debrided (cm): otal 0.33 Tissue and other material debrided: Non-Viable, Slough, Slough Level: Non-Viable Tissue Debridement Description: Selective/Open Wound Instrument: Curette Bleeding: Minimum Hemostasis Achieved: Pressure Procedural Pain: 2 Post Procedural Pain: 1 Response to Treatment: Procedure was tolerated well Level of  Consciousness (Post- Awake and Alert procedure): Post Debridement Measurements of Total Wound Length: (cm) 0.7 Stage: Category/Stage II Width: (cm) 0.6 Depth: (cm) 0.1 Volume: (cm) 0.033 Character of Wound/Ulcer Post Debridement: Improved Post Procedure Diagnosis Same as Pre-procedure Notes scribed for Dr. Lady [name] by Megan Deed, RN Electronic Signature(s) Signed: 04/09/2023 3:45:57 PM By: Megan Guess MD FACS Signed: 04/09/2023 4:06:41 PM By: Megan Deed RN, BSN Entered By: [name] on 04/09/2023 15:30:04 -------------------------------------------------------------------------------- Debridement Details Patient Name: Date of Service: [name]. 04/09/2023 2:45 PM Medical Record Number: [id number] Patient Account Number: [id number] Date of Birth/Sex: Treating RN: May 27, 1928 (89 y.o. [name] Primary Care Provider: Jorge [name] Other Clinician: Referring Provider: Treating Provider/Extender: [name] Weeks in Treatment: 4 Debridement Performed for Assessment: Wound #11 Right T Third oe Performed By: Physician Megan Guess, MD Debridement Type: Debridement Level of Consciousness (Pre-procedure): Awake and Alert Pre-procedure Verification/Time Out Yes - 15:20 [name], [name] ([id number]) [id number] Page 4 of 19 Yes - 15:20 Taken: Start Time: 15:23 Pain Control: Lidocaine 4% Topical Solution Percent of Wound Bed Debrided: 100% T Area Debrided (cm): otal 0.57 Tissue and other material debrided: Non-Viable, Eschar, Slough, Slough Level: Non-Viable Tissue Debridement Description: Selective/Open Wound Instrument: Curette Bleeding: Minimum Hemostasis Achieved: Pressure Procedural Pain: 2 Post Procedural Pain: 1 Response to Treatment: Procedure was tolerated well Level of Consciousness (Post- Awake and Alert procedure): Post Debridement Measurements of Total Wound Length: (cm) 0.8 Stage:  Category/Stage IV Width: (cm) 0.9 Depth: (cm) 0.1 Volume: (cm) 0.057 Character of Wound/Ulcer Post Debridement: Improved Post Procedure Diagnosis Same as Pre-procedure Notes scribed for Dr. Lady [name] by Megan Deed, RN Electronic Signature(s) Signed: 04/09/2023 3:45:57 PM By: Megan Guess MD FACS Signed: 04/09/2023 4:06:41 PM By: Megan Deed RN, BSN Entered By: [name] on 04/09/2023 15:31:16 -------------------------------------------------------------------------------- Debridement Details Patient Name: Date of Service: [name]. 04/09/2023 2:45 PM Medical Record Number: [id number] Patient Account Number: [id number] Date of Birth/Sex: Treating RN: [birth year] (89 y.o. [name] Primary Care Provider: Jorge [name] Other Clinician: Referring Provider: Treating Provider/Extender: [name] Weeks in Treatment: 4 Debridement Performed for Assessment: Wound #15 Right,Dorsal T Great oe Performed By: Physician Megan Guess, MD Debridement Type: Debridement Severity of Tissue Pre Debridement: Bone involvement without necrosis Level of Consciousness (Pre-procedure): Awake and Alert Pre-procedure Verification/Time Out Yes - 15:20 Taken: Start Time: 15:23 Pain Control: Lidocaine 4% Topical Solution Percent of Wound Bed Debrided: 100% T Area Debrided (cm): otal 0.38 Tissue and other material debrided: Non-Viable, Eschar, Slough, Slough Level: Non-Viable Tissue Debridement Description: Selective/Open Wound Instrument: Curette Bleeding: Minimum Hemostasis Achieved: Pressure Procedural Pain: 2 Post Procedural Pain: 1 Response to  Treatment: Procedure was tolerated well Level of Consciousness (Post- Awake and Alert procedure): Post Debridement Measurements of Total Wound Kirshenbaum, Jenavieve [name] ([id number]) [id number] Page 5 of 19 Length: (cm) 0.6 Width: (cm) 0.8 Depth: (cm) 0.1 Volume: (cm) 0.038 Character of  Wound/Ulcer Post Debridement: Improved Severity of Tissue Post Debridement: Bone involvement without necrosis Post Procedure Diagnosis Same as Pre-procedure Notes scribed for Dr. Lady [name] by Megan Deed, RN Electronic Signature(s) Signed: 04/09/2023 3:45:57 PM By: Megan Guess MD FACS Signed: 04/09/2023 4:06:41 PM By: Megan Deed RN, BSN Entered By: [name] on 04/09/2023 15:31:55 -------------------------------------------------------------------------------- Debridement Details Patient Name: Date of Service: [name]. 04/09/2023 2:45 PM Medical Record Number: [id number] Patient Account Number: [id number] Date of Birth/Sex: Treating RN: 1927/12/18 (89 y.o. [name] Primary Care Provider: Jorge [name] Other Clinician: Referring Provider: Treating Provider/Extender: [name] Weeks in Treatment: 4 Debridement Performed for Assessment: Wound #10 Left T Second oe Performed By: Physician Megan Guess, MD Debridement Type: Debridement Level of Consciousness (Pre-procedure): Awake and Alert Pre-procedure Verification/Time Out Yes - 15:20 Taken: Start Time: 15:23 Pain Control: Lidocaine 4% T opical Solution Percent of Wound Bed Debrided: 100% T Area Debrided (cm): otal 0.38 Tissue and other material debrided: Non-Viable, Slough, Slough Level: Non-Viable Tissue Debridement Description: Selective/Open Wound Instrument: Curette Bleeding: Minimum Hemostasis Achieved: Pressure Procedural Pain: 2 Post Procedural Pain: 1 Response to Treatment: Procedure was tolerated well Level of Consciousness (Post- Awake and Alert procedure): Post Debridement Measurements of Total Wound Length: (cm) 0.6 Stage: Category/Stage IV Width: (cm) 0.8 Depth: (cm) 0.1 Volume: (cm) 0.038 Character of Wound/Ulcer Post Debridement: Improved Post Procedure Diagnosis Same as Pre-procedure Notes scribed for Dr. Lady [name] by Megan Deed, RN Electronic  Signature(s) Signed: 04/09/2023 3:45:57 PM By: Megan Guess MD FACS Signed: 04/09/2023 4:06:41 PM By: Megan Deed RN, BSN Entered By: [name] on 04/09/2023 15:32:41 [name], [name] ([id number]) [id number] Page 6 of 19 -------------------------------------------------------------------------------- Debridement Details Patient Name: Date of Service: TENNISHA, BEEDE 04/09/2023 2:45 PM Medical Record Number: [id number] Patient Account Number: [id number] Date of Birth/Sex: Treating RN: 1927-12-24 (89 y.o. [name], [name] Primary Care Provider: Jorge [name] Other Clinician: Referring Provider: Treating Provider/Extender: [name] Weeks in Treatment: 4 Debridement Performed for Assessment: Wound #14 Left T Third oe Performed By: Physician Megan Guess, MD Debridement Type: Debridement Severity of Tissue Pre Debridement: Bone involvement without necrosis Level of Consciousness (Pre-procedure): Awake and Alert Pre-procedure Verification/Time Out Yes - 15:20 Taken: Start Time: 15:23 Pain Control: Lidocaine 4% T opical Solution Percent of Wound Bed Debrided: 100% T Area Debrided (cm): otal 0.13 Tissue and other material debrided: Non-Viable, Slough, Slough Level: Non-Viable Tissue Debridement Description: Selective/Open Wound Instrument: Curette Bleeding: Minimum Hemostasis Achieved: Pressure Procedural Pain: 2 Post Procedural Pain: 1 Response to Treatment: Procedure was tolerated well Level of Consciousness (Post- Awake and Alert procedure): Post Debridement Measurements of Total Wound Length: (cm) 0.4 Width: (cm) 0.4 Depth: (cm) 0.1 Volume: (cm) 0.013 Character of Wound/Ulcer Post Debridement: Improved Severity of Tissue Post Debridement: Bone involvement without necrosis Post Procedure Diagnosis Same as Pre-procedure Notes scribed for Dr. Lady [name] by Megan Deed, RN Electronic Signature(s) Signed: 04/09/2023  3:45:57 PM By: Megan Guess MD FACS Signed: 04/09/2023 4:06:41 PM By: Megan Deed RN, BSN Entered By: [name] on 04/09/2023 15:33:32 -------------------------------------------------------------------------------- HPI Details Patient Name: Date of Service: [name]. 04/09/2023 2:45 PM Medical Record Number: [id number] Patient Account Number: [id number] Date of Birth/Sex: Treating RN: Jan 17, 1928 (89 y.o. F) Primary Care  Provider: Jorge [name] Other Clinician: Referring Provider: Treating Provider/Extender: [name] Weeks in Treatment: [address] [name] ([id number]) [id number] Page 7 of 19 History of Present Illness HPI Description: Admission 01/28/2022 Ms. 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 She states she has tried Medihoney and silver alginate in the past to this area with little benefit. She also has 2 small areas limited to skin breakdown to her lower extremities bilaterally. She has chronic venous insufficiency but not has not been wearing her compression stockings. She states she bumped her legs against an object and not so the wound started. She has been using Medihoney to the sites. She denies signs of infection. 6/2; patient presents for follow-up. She had an x-ray of her right foot done at last clinic visit and this was negative for evidence of osteomyelitis. She also had a wound culture done that showed extra high levels of Staph aureus. I recommended Keystone antibiotics for this and this was ordered. She had ABIs with TBI's done as well that showed monophasic waveforms to the right foot with TBI of 0 and an ABI of 0.52. Urgent  referral was made to vein and vascular and she saw Dr. Durwin Nora on 6/1, yesterday and he recommended an arteriogram. This is scheduled for 6/16. Patient also reports a new wound to the right great toe. This is a blister that has ruptured. She also reports increased erythema to the toe. 6/6; the patient was worked in urgently today at the insistence of her daughter out of concern for a new wound on the lateral part of the plantar right great toe. She has her original postsurgical wound after the amputation of the right second toe, she has a wound on the medial part of the right great toe. The patient is apparently going for an angiogram by Dr. Durwin Nora in 2 weeks time. 6/13; patient presents for follow-up. She has been using bacitracin to the abrasion on the right great toe. She has been using collagen and Keystone antibiotic to the amputation site. She has no issues or complaints today. She denies signs of infection. 6/22; patient presents for follow-up. She states that her abdominal aortogram was canceled due to her renal function. She has been using Keystone antibiotics to the amputation site and Medihoney to the right great toe wound. At the pace of the right great toe she has a slitlike open area that she thinks was caused by the tape from the dressing. 6/29; patient presents for follow-up. She has been using Keystone antibiotics to the amputation site along with collagen. She has been using Medihoney to the right great toe wound. She has no other wounds. She denies signs of infection. 7/13; patient presents for follow-up. She has been using Keystone antibiotics and collagen to the wound sites. She currently denies signs of infection. She states she is scheduled to see me nephrology next month. 7/20; patient presents for follow-up. She continue Keystone antibiotics and collagen to the wound sites. She has no issues or complaints today. 8/1; patient presents for follow up. She continues to use keystone  antibiotics and collagen to the wound sites. She has no issues or complaints today. 8/15; patient presents for follow-up. She has been using Keystone antibiotics  and collagen to the wound sites. She followed up with her nephrologist who made medication changes. She is supposed to get a repeat BMP in 2 weeks. Her decrease in renal function was a limiting factor in obtaining an arteriogram for potential intervention for revascularization. She currently denies signs of infection. 9/2; patient presents for follow-up. She has been using Keystone antibiotic and collagen to the wound beds. She has no issues or complaints today. Reports there has been improvement in kidney function however not cleared to have her arteriogram just yet. 10/10; patient presents for follow-up. She has been using Keystone antibiotic and collagen to the wound beds. She reports 2 new wounds 1 to the anterior right lower extremity and another to the plantar aspect of the right foot. She states that the right plantar foot wound was caused by the home health nurse changing the dressing and causing a skin tear. She is not sure how the right anterior leg wound started. It appears to be from trauma. She denies signs of infection. 10/19; patient presents for follow-up. She has been using Keystone antibiotic and collagen to the right great toe wound. She is been using silver alginate to the right anterior and right plantar foot wound. She has been using Tubigrip to the right lower extremity. The plantar foot wound has healed. She has no issues or complaints today. 11/3; since the patient was last here she was seen in urgent care apparently for an area on the dorsal aspect of the right fifth toe perhaps over the PIP. I saw a picture of this on the daughter's cell phone. There was slough on this. Urgent care gave him doxycycline. She is also changed the dressing to all wounds back the [facility] and collagen which includes her right leg and left  first toe 11/16; patient presents for follow-up. She has a new wound to the left knee. She states she fell. She has been using antibiotic ointment and collagen to this area. She has been using collagen and Keystone antibiotic to the right great toe wound. The anterior right leg wound is healed. She denies signs of infection. 11/30; patient presents for follow-up. The right great toe wound has healed. She has 1 remaining wound to the left knee. She has been using Hydrofera Blue and Medihoney here. 12/19; patient presents for follow-up. Her left knee wound has healed. She has no issues or complaints today. 09/30/2022 Patient's daughter called for an appointment due to increased swelling to her lower extremities bilaterally. Today patient presents with increased swelling to her lower extremities although there is no increased redness or warmth. She was evaluated by her nephrologist who increased her diuretics. Per patient and daughter her swelling has gone down to the leg Over the past several days. She does not have any open wounds. She was advised to elevate her legs and not consume excess salt By her PCP and nephrologist. Patient has compression stockings that she has been using sporadically. READMISSION 03/10/2023 Since her last visit to the clinic, the patient has been hospitalized at least twice, in February with COVID pneumonia and CHF exacerbation. She was discharged to a skilled nursing facility and then readmitted in April with fevers. She was noted to have pressure ulcers on her heels and sacrum. Per family declined skilled nursing placement upon discharge and she has apparently been residing at home. She has followed with the vascular surgery clinic regarding peripheral artery disease and due to ulcerations on her feet, she ultimately underwent arteriography with balloon angioplasties  of the superficial femoral artery, popliteal artery and peroneal artery on the left, along with shockwave  ultrasound assisted balloon angioplasty of the popliteal artery and distal SFA. Given her multiple significant medical comorbidities, she is not a candidate for open surgery. She is deemed to be maximally vascularized this procedure, which was performed on 04 March 2023. T oday, she has a stage III pressure ulcer on her left heel and wounds on the PIP joints of her right third and left second toes. No sacral wound is present and the ulcer on her left heel has closed. 03/18/2023: She has a new ulcer on her left great toe. It was just noticed yesterday and the etiology is unknown. The fat layer is exposed and there is some slough accumulation. The other wounds are all essentially unchanged in size. There is a little bit more granulation tissue on the other toe wounds, but bone is still frankly exposed on both sides. The heel ulcer has thick slough accumulation. 03/26/2023: She has a new wound on the tip of her left third toe. It is black. Neither the patient nor her daughter are aware of how it began. The other wounds are stable. 03/31/2023: The culture that I took from her left third toe returned with MRSA. We contacted the patient's daughter to have her stop levofloxacin and start doxycycline, but she has not yet picked up the new antibiotic. All of her wounds have accumulated slough. Bone remains exposed. She is scheduled to follow- up with vascular surgery tomorrow to evaluate the patency of her revascularization. LILLEIGH, CRYDER ([id number]) [id number] Page 8 of 19 04/09/2023: She saw her vascular surgery and her ABIs are improved; her TBI's could not be checked due to her bandages. She did finally start taking the prescribed doxycycline. All of her wounds look about the same today. Electronic Signature(s) Signed: 04/09/2023 3:36:56 PM By: Megan Guess MD FACS Entered By: [name] on 04/09/2023  15:36:56 -------------------------------------------------------------------------------- Physical Exam Details Patient Name: Date of Service: [name]. 04/09/2023 2:45 PM Medical Record Number: [id number] Patient Account Number: [id number] Date of Birth/Sex: Treating RN: 12-26-1927 (89 y.o. F) Primary Care Provider: Jorge [name] Other Clinician: Referring Provider: Treating Provider/Extender: Merceda Elks, Tereso Newcomer Weeks in Treatment: 4 Constitutional . . . . no acute distress. Respiratory Normal work of breathing on supplemental oxygen. Notes 04/09/2023: No real change to any of the wounds. Bone remains exposed in multiple sites. The heel ulcer is possibly a little bit smaller, but otherwise unchanged. Electronic Signature(s) Signed: 04/09/2023 3:39:20 PM By: Megan Guess MD FACS Entered By: [name] on 04/09/2023 15:39:20 -------------------------------------------------------------------------------- Physician Orders Details Patient Name: Date of Service: [name]. 04/09/2023 2:45 PM Medical Record Number: [id number] Patient Account Number: [id number] Date of Birth/Sex: Treating RN: 1928-01-03 (89 y.o. 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 - Dr. Lady [name] -  room 1 Return A Biagini, Nickole [name] ([id number]) [id number] Page 9 of 19 Thursday 8/15 @ 2:30 pm Nurse Visit: - 8/8 @ 3:30 pm Anesthetic (In clinic) Topical Lidocaine 4% applied to wound bed Bathing/ Shower/ Hygiene May shower and wash wound with soap and water. Edema Control - Lymphedema / SCD / Other Elevate legs to the level of the heart or above for 30 minutes daily and/or when sitting for 3-4 times a day throughout the day. Avoid standing for long periods of time. Patient to wear own compression stockings every day. Additional Orders / Instructions Follow Nutritious Diet - add in protein shakes every day to diet - recommend premier protein 500 mg x3 a day vitamin C, zinc 30-50 mg per day Home Health No change in wound care orders this week; continue Home Health for wound care. May utilize formulary equivalent dressing for wound treatment orders unless otherwise specified. Other Home Health Orders/Instructions: - Medi Home Wound Treatment Wound #10 - T Second oe Wound Laterality: Left Cleanser: Soap and Water 1 x Per Day/30 Days Discharge Instructions: May shower and wash wound with dial antibacterial soap and water [name] to dressing change. Cleanser: Wound Cleanser 1 x Per Day/30 Days Discharge Instructions: Cleanse the wound with wound cleanser [name] to applying a clean dressing using gauze sponges, not tissue or cotton balls. Prim Dressing: Promogran Prisma Matrix, 4.34 (sq in) (silver collagen) (Generic) 1 x Per Day/30 Days ary Discharge Instructions: Moisten collagen with saline or hydrogel Secondary Dressing: Woven Gauze Sponge, Non-Sterile 4x4 in (Generic) 1 x Per Day/30 Days Discharge Instructions: Apply over primary dressing as directed. Secured With: 36M Medipore H Soft Cloth Surgical T ape, 4 x 10 (in/yd) (Generic) 1 x Per Day/30 Days Discharge  Instructions: Secure with tape as directed. Wound #11 - T Third oe Wound Laterality: Right Cleanser: Soap and Water 1 x Per Day/30 Days Discharge Instructions: May shower and wash wound with dial antibacterial soap and water [name] to dressing change. Cleanser: Wound Cleanser 1 x Per Day/30 Days Discharge Instructions: Cleanse the wound with wound cleanser [name] to applying a clean dressing using gauze sponges, not tissue or cotton balls. Prim Dressing: Promogran Prisma Matrix, 4.34 (sq in) (silver collagen) (Generic) 1 x Per Day/30 Days ary Discharge Instructions: Moisten collagen with saline or hydrogel Secondary Dressing: Woven Gauze Sponge, Non-Sterile 4x4 in (Generic) 1 x Per Day/30 Days Discharge Instructions: Apply over primary dressing as directed. Secured With: 36M Medipore H Soft Cloth Surgical T ape, 4 x 10 (in/yd) (Generic) 1 x Per Day/30 Days Discharge Instructions: Secure with tape as directed. Wound #12 - T Great oe Wound Laterality: Left Cleanser: Soap and Water 1 x Per Day/30 Days Discharge Instructions: May shower and wash wound with dial antibacterial soap and water [name] to dressing change. Cleanser: Wound Cleanser 1 x Per Day/30 Days Discharge Instructions: Cleanse the wound with wound cleanser [name] to applying a clean dressing using gauze sponges, not tissue or cotton balls. Prim Dressing: Promogran Prisma Matrix, 4.34 (sq in) (silver collagen) (Generic) 1 x Per Day/30 Days ary Discharge Instructions: Moisten collagen with saline or hydrogel Secondary Dressing: Woven Gauze Sponge, Non-Sterile 4x4 in (Generic) 1 x Per Day/30 Days Discharge Instructions: Apply over primary dressing as directed. Secured With: 36M Medipore H Soft Cloth Surgical T ape, 4 x 10 (in/yd) (Generic) 1 x Per Day/30 Days Discharge Instructions: Secure with tape as directed. Wound #13 - T Great oe Wound Laterality: Dorsal, Left Cleanser: Soap and Water 1 x Per Day/30  Days Discharge Instructions:  May shower and wash wound with dial antibacterial soap and water [name] to dressing change. Cleanser: Wound Cleanser 1 x Per Day/30 Days [name], Skarlett [name] ([id number]) [id number] Page 10 of 19 Discharge Instructions: Cleanse the wound with wound cleanser [name] to applying a clean dressing using gauze sponges, not tissue or cotton balls. Prim Dressing: Promogran Prisma Matrix, 4.34 (sq in) (silver collagen) (Generic) 1 x Per Day/30 Days ary Discharge Instructions: Moisten collagen with saline or hydrogel Secondary Dressing: Woven Gauze Sponge, Non-Sterile 4x4 in (Generic) 1 x Per Day/30 Days Discharge Instructions: Apply over primary dressing as directed. Secured With: 4104M Medipore H Soft Cloth Surgical T ape, 4 x 10 (in/yd) (Generic) 1 x Per Day/30 Days Discharge Instructions: Secure with tape as directed. Wound #14 - T Third oe Wound Laterality: Left Cleanser: Soap and Water 1 x Per Day/30 Days Discharge Instructions: May shower and wash wound with dial antibacterial soap and water [name] to dressing change. Cleanser: Wound Cleanser 1 x Per Day/30 Days Discharge Instructions: Cleanse the wound with wound cleanser [name] to applying a clean dressing using gauze sponges, not tissue or cotton balls. Prim Dressing: Promogran Prisma Matrix, 4.34 (sq in) (silver collagen) (Generic) 1 x Per Day/30 Days ary Discharge Instructions: Moisten collagen with saline or hydrogel Secondary Dressing: Woven Gauze Sponge, Non-Sterile 4x4 in (Generic) 1 x Per Day/30 Days Discharge Instructions: Apply over primary dressing as directed. Secured With: 4104M Medipore H Soft Cloth Surgical T ape, 4 x 10 (in/yd) (Generic) 1 x Per Day/30 Days Discharge Instructions: Secure with tape as directed. Wound #15 - T Great oe Wound Laterality: Dorsal, Right Cleanser: Soap and Water 1 x Per Day/30 Days Discharge Instructions: May shower and wash wound with dial antibacterial soap and water [name] to dressing  change. Cleanser: Wound Cleanser 1 x Per Day/30 Days Discharge Instructions: Cleanse the wound with wound cleanser [name] to applying a clean dressing using gauze sponges, not tissue or cotton balls. Prim Dressing: Promogran Prisma Matrix, 4.34 (sq in) (silver collagen) (Generic) 1 x Per Day/30 Days ary Discharge Instructions: Moisten collagen with saline or hydrogel Secondary Dressing: Woven Gauze Sponge, Non-Sterile 4x4 in (Generic) 1 x Per Day/30 Days Discharge Instructions: Apply over primary dressing as directed. Secured With: 4104M Medipore H Soft Cloth Surgical T ape, 4 x 10 (in/yd) (Generic) 1 x Per Day/30 Days Discharge Instructions: Secure with tape as directed. Wound #9 - Calcaneus Wound Laterality: Left Cleanser: Soap and Water 1 x Per Day/30 Days Discharge Instructions: May shower and wash wound with dial antibacterial soap and water [name] to dressing change. Cleanser: Wound Cleanser 1 x Per Day/30 Days Discharge Instructions: Cleanse the wound with wound cleanser [name] to applying a clean dressing using gauze sponges, not tissue or cotton balls. Prim Dressing: Promogran Prisma Matrix, 4.34 (sq in) (silver collagen) (Generic) 1 x Per Day/30 Days ary Discharge Instructions: Moisten collagen with saline or hydrogel Secondary Dressing: ALLEVYN Heel 4 1/2in x 5 1/2in / 10.5cm x 13.5cm (Generic) 1 x Per Day/30 Days Discharge Instructions: Apply over primary dressing as directed. Secondary Dressing: Woven Gauze Sponge, Non-Sterile 4x4 in (Generic) 1 x Per Day/30 Days Discharge Instructions: Apply over primary dressing as directed. Secured With: 4104M Medipore H Soft Cloth Surgical T ape, 4 x 10 (in/yd) (Generic) 1 x Per Day/30 Days Discharge Instructions: Secure with tape as directed. Electronic Signature(s) Signed: 04/09/2023 3:45:57 PM By: Megan Guess MD FACS Entered By: [name] on 04/09/2023 15:39:36 [place], [name] ([id number]) [id number] Page  11  of 19 -------------------------------------------------------------------------------- Problem List Details Patient Name: Date of Service: AREYON, ENGSTRAND 04/09/2023 2:45 PM Medical Record Number: [id number] Patient Account Number: [id number] Date of Birth/Sex: Treating RN: Aug 17, 1928 (89 y.o. [name], [name] Primary Care Provider: Jorge [name] Other Clinician: Referring Provider: Treating Provider/Extender: [name] Weeks in Treatment: 4 Active Problems ICD-10 Encounter Code Description Active Date MDM Diagnosis [id number] Pressure ulcer of left heel, stage 3 03/10/2023 No Yes 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 03/10/2023 No Yes without evidence of necrosis 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 03/10/2023 No Yes without evidence of necrosis L97.522 Non-pressure chronic ulcer of other part of left foot with fat layer exposed 03/18/2023 No Yes I73.9 Peripheral vascular disease, unspecified 03/10/2023 No Yes I50.32 Chronic diastolic (congestive) heart failure 03/10/2023 No Yes N18.32 Chronic kidney disease, stage 3b 03/10/2023 No Yes I87.2 Venous insufficiency (chronic) (peripheral) 03/10/2023 No Yes Inactive Problems Resolved Problems Electronic Signature(s) Signed: 04/09/2023 3:18:08 PM By: Megan Guess MD FACS Entered By: [name] on 04/09/2023 15:18:08 -------------------------------------------------------------------------------- Progress Note Details Patient Name: Date of Service: [name]. 04/09/2023 2:45 PM Medical Record Number: [id number] Patient Account Number: [id number] [name], [name] ([id number]) [id number] Page 12 of 19 Date of Birth/Sex: Treating RN: 1928/09/08 (89 y.o. F) Primary Care Provider: Other Clinician: Jorge [name] Referring Provider: Treating Provider/Extender: Tiajuana Amass in  Treatment: 4 Subjective Chief Complaint Information obtained from Patient 01/28/2022; right second toe amputation site dehiscence and bilateral lower extremity wounds. 03/10/2023: pressure ulcers of right heel, ulcers on toes History of Present Illness (HPI) Admission 01/28/2022 Ms. 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 She states she has tried Medihoney and silver alginate in the past to this area with little benefit. She also has 2 small areas limited to skin breakdown to her lower extremities bilaterally. She has chronic venous insufficiency but not has not been wearing her compression stockings. She states she bumped her legs against an object and not so the wound started. She has been using Medihoney to the sites. She denies signs of infection. 6/2; patient presents for follow-up. She had an x-ray of her right foot done at last clinic visit and this was negative for evidence of osteomyelitis. She also had a wound culture done that showed extra high levels of Staph aureus. I recommended Keystone antibiotics for this and this was ordered. She had ABIs with TBI's done as well that showed monophasic waveforms to the right foot with TBI of 0 and an ABI of 0.52. Urgent referral was made to vein and vascular and she saw Dr. Durwin Nora on 6/1, yesterday and he recommended an arteriogram. This is scheduled for 6/16. Patient also reports a new wound to the right great toe. This is a blister that has ruptured. She also reports increased erythema to the toe. 6/6; the patient was worked in urgently today at the insistence of her daughter out of concern for a new wound on the lateral part of the plantar right great toe. She has her  original postsurgical wound after the amputation of the right second toe, she has a wound on the medial part  of the right great toe. The patient is apparently going for an angiogram by Dr. Durwin Nora in 2 weeks time. 6/13; patient presents for follow-up. She has been using bacitracin to the abrasion on the right great toe. She has been using collagen and Keystone antibiotic to the amputation site. She has no issues or complaints today. She denies signs of infection. 6/22; patient presents for follow-up. She states that her abdominal aortogram was canceled due to her renal function. She has been using Keystone antibiotics to the amputation site and Medihoney to the right great toe wound. At the pace of the right great toe she has a slitlike open area that she thinks was caused by the tape from the dressing. 6/29; patient presents for follow-up. She has been using Keystone antibiotics to the amputation site along with collagen. She has been using Medihoney to the right great toe wound. She has no other wounds. She denies signs of infection. 7/13; patient presents for follow-up. She has been using Keystone antibiotics and collagen to the wound sites. She currently denies signs of infection. She states she is scheduled to see me nephrology next month. 7/20; patient presents for follow-up. She continue Keystone antibiotics and collagen to the wound sites. She has no issues or complaints today. 8/1; patient presents for follow up. She continues to use keystone antibiotics and collagen to the wound sites. She has no issues or complaints today. 8/15; patient presents for follow-up. She has been using Keystone antibiotics and collagen to the wound sites. She followed up with her nephrologist who made medication changes. She is supposed to get a repeat BMP in 2 weeks. Her decrease in renal function was a limiting factor in obtaining an arteriogram for potential intervention for revascularization. She currently  denies signs of infection. 9/2; patient presents for follow-up. She has been using Keystone antibiotic and collagen to the wound beds. She has no issues or complaints today. Reports there has been improvement in kidney function however not cleared to have her arteriogram just yet. 10/10; patient presents for follow-up. She has been using Keystone antibiotic and collagen to the wound beds. She reports 2 new wounds 1 to the anterior right lower extremity and another to the plantar aspect of the right foot. She states that the right plantar foot wound was caused by the home health nurse changing the dressing and causing a skin tear. She is not sure how the right anterior leg wound started. It appears to be from trauma. She denies signs of infection. 10/19; patient presents for follow-up. She has been using Keystone antibiotic and collagen to the right great toe wound. She is been using silver alginate to the right anterior and right plantar foot wound. She has been using Tubigrip to the right lower extremity. The plantar foot wound has healed. She has no issues or complaints today. 11/3; since the patient was last here she was seen in urgent care apparently for an area on the dorsal aspect of the right fifth toe perhaps over the PIP. I saw a picture of this on the daughter's cell phone. There was slough on this. Urgent care gave him doxycycline. She is also changed the dressing to all wounds back the [facility] and collagen which includes her right leg and left first toe 11/16; patient presents for follow-up. She has a new wound to the left knee. She states she fell. She has been using antibiotic ointment and collagen to this area. She has been using collagen and Keystone antibiotic to  the right great toe wound. The anterior right leg wound is healed. She denies signs of infection. 11/30; patient presents for follow-up. The right great toe wound has healed. She has 1 remaining wound to the left knee. She  has been using Hydrofera Blue and Medihoney here. 12/19; patient presents for follow-up. Her left knee wound has healed. She has no issues or complaints today. 09/30/2022 Patient's daughter called for an appointment due to increased swelling to her lower extremities bilaterally. Today patient presents with increased swelling to her lower extremities although there is no increased redness or warmth. She was evaluated by her nephrologist who increased her diuretics. Per patient and daughter her swelling has gone down to the leg Over the past several days. She does not have any open wounds. She was advised to elevate her legs and not consume excess salt By her PCP and nephrologist. Patient has compression stockings that she has been using sporadically. READMISSION 03/10/2023 Since her last visit to the clinic, the patient has been hospitalized at least twice, in February with COVID pneumonia and CHF exacerbation. She was discharged to a skilled nursing facility and then readmitted in April with fevers. She was noted to have pressure ulcers on her heels and sacrum. Per family declined skilled nursing placement upon discharge and she has apparently been residing at home. She has followed with the vascular surgery clinic regarding peripheral artery disease and due to ulcerations on her feet, she ultimately underwent arteriography with balloon angioplasties of the superficial femoral artery, popliteal artery and peroneal artery on the left, along with shockwave ultrasound assisted balloon angioplasty of the popliteal artery and distal SFA. Given her multiple significant medical comorbidities, she is not a candidate for open surgery. She is deemed to be maximally vascularized this procedure, which was performed on 04 March 2023. T oday, she has a stage III pressure ulcer on her left heel and wounds on the PIP joints of her right third and left second toes. No Platner, Marien [name] ([id number])  [id number] Page 13 of 19 sacral wound is present and the ulcer on her left heel has closed. 03/18/2023: She has a new ulcer on her left great toe. It was just noticed yesterday and the etiology is unknown. The fat layer is exposed and there is some slough accumulation. The other wounds are all essentially unchanged in size. There is a little bit more granulation tissue on the other toe wounds, but bone is still frankly exposed on both sides. The heel ulcer has thick slough accumulation. 03/26/2023: She has a new wound on the tip of her left third toe. It is black. Neither the patient nor her daughter are aware of how it began. The other wounds are stable. 03/31/2023: The culture that I took from her left third toe returned with MRSA. We contacted the patient's daughter to have her stop levofloxacin and start doxycycline, but she has not yet picked up the new antibiotic. All of her wounds have accumulated slough. Bone remains exposed. She is scheduled to follow- up with vascular surgery tomorrow to evaluate the patency of her revascularization. 04/09/2023: She saw her vascular surgery and her ABIs are improved; her TBI's could not be checked due to her bandages. She did finally start taking the prescribed doxycycline. All of her wounds look about the same today. Patient History Information obtained from Patient, Caregiver, Chart. Family History Cancer - Siblings, Heart Disease, Stroke - Mother, No family history of Diabetes. Social History Former smoker - quit 2003, Marital  Status - Widowed, Alcohol Use - Never, Drug Use - No History, Caffeine Use - Never. Medical History Eyes Patient has history of Cataracts Hematologic/Lymphatic Patient has history of Anemia Respiratory Denies history of Chronic Obstructive Pulmonary Disease (COPD) Cardiovascular Patient has history of Arrhythmia - a-fib, Congestive Heart Failure, Hypotension, Peripheral Venous  Disease Musculoskeletal Patient has history of 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 Neurologic Patient has history of Neuropathy Hospitalization/Surgery History - Abdominal aortogram w/lower extremity. - Amputation toe (Right). - Colonoscopy. - Elbow surgery. - Laparoscopic hysterectomy. Medical A Surgical History Notes nd Hematologic/Lymphatic Thrombocytopenia, Anticoagulant long-term use Respiratory Pulmonary embolus Gastrointestinal Hiatal hernia Endocrine Hyperthyroidism, Hypothyroidism Genitourinary CKD stage III Musculoskeletal arthritis Objective Constitutional no acute distress. Vitals Time Taken: 2:54 PM, Height: 64 in, Weight: 157 lbs, BMI: 26.9, Temperature: 98.2 F, Pulse: 74 bpm, Respiratory Rate: 18 breaths/min, Blood Pressure: 126/72 mmHg. Respiratory Normal work of breathing on supplemental oxygen. General Notes: 04/09/2023: No real change to any of the wounds. Bone remains exposed in multiple sites. The heel ulcer is possibly a little bit smaller, but otherwise unchanged. Integumentary (Hair, Skin) Wound #10 status is Open. Original cause of wound was Pressure Injury. The date acquired was: 10/09/2022. The wound has been in treatment 4 weeks. The wound is located on the Left T Second. The wound measures 0.6cm length x 0.8cm width x 0.1cm depth; 0.377cm^2 area and 0.038cm^3 volume. There is oe bone, joint, and Fat Layer (Subcutaneous Tissue) exposed. There is no tunneling or undermining noted. There is a small amount of serosanguineous drainage noted. The wound margin is distinct with the outline attached to the wound base. There is large (67-100%) red granulation within the wound bed. There is a small (1-33%) amount of necrotic tissue within the wound bed including Adherent Slough. The periwound skin appearance had no abnormalities noted for texture. The periwound skin appearance had no abnormalities noted for moisture. The periwound skin appearance had no  abnormalities noted for color. [address] SALEEN, ATMORE ([id number]) [id number] Page 14 of 19 temperature was noted as No Abnormality. Wound #11 status is Open. Original cause of wound was Pressure Injury. The date acquired was: 10/09/2022. The wound has been in treatment 4 weeks. The wound is located on the Right T Third. The wound measures 0.8cm length x 0.9cm width x 0.1cm depth; 0.565cm^2 area and 0.057cm^3 volume. There is oe bone, joint, and Fat Layer (Subcutaneous Tissue) exposed. There is no tunneling or undermining noted. There is a medium amount of serosanguineous drainage noted. The wound margin is distinct with the outline attached to the wound base. There is large (67-100%) red granulation within the wound bed. There is a small (1-33%) amount of necrotic tissue within the wound bed including Adherent Slough. The periwound skin appearance had no abnormalities noted for texture. The periwound skin appearance had no abnormalities noted for moisture. The periwound skin appearance had no abnormalities noted for color. Periwound temperature was noted as No Abnormality. The periwound has tenderness on palpation. Wound #12 status is Open. Original cause of wound was Gradually Appeared. The date acquired was: 03/17/2023. The wound has been in treatment 3 weeks. The wound is located on the Left T Great. The wound measures 0.4cm length x 0.5cm width x 0.1cm depth; 0.157cm^2 area and 0.016cm^3 volume. There is Fat oe Layer (Subcutaneous Tissue) exposed. There is no tunneling noted, however, there is undermining starting at 12:00 and ending at 12:00 with a maximum distance of 0.3cm. There is a small amount of purulent drainage  noted. The wound margin is flat and intact. There is small (1-33%) pink granulation within the wound bed. There is a large (67-100%) amount of necrotic tissue within the wound bed including Adherent Slough. The periwound skin appearance had  no abnormalities noted for texture. The periwound skin appearance had no abnormalities noted for moisture. The periwound skin appearance did not exhibit: Erythema. Periwound temperature was noted as No Abnormality. The periwound has tenderness on palpation. Wound #13 status is Open. Original cause of wound was Gradually Appeared. The date acquired was: 03/24/2023. The wound has been in treatment 2 weeks. The wound is located on the Left,Dorsal T Great. The wound measures 0.6cm length x 1.1cm width x 0.1cm depth; 0.518cm^2 area and 0.052cm^3 volume. oe There is Fat Layer (Subcutaneous Tissue) exposed. There is no tunneling or undermining noted. There is a medium amount of serosanguineous drainage noted. The wound margin is distinct with the outline attached to the wound base. There is small (1-33%) red granulation within the wound bed. There is a large (67-100%) amount of necrotic tissue within the wound bed including Eschar and Adherent Slough. The periwound skin appearance had no abnormalities noted for texture. The periwound skin appearance had no abnormalities noted for moisture. The periwound skin appearance exhibited: Erythema. The surrounding wound skin color is noted with erythema which is circumferential. Periwound temperature was noted as No Abnormality. The periwound has tenderness on palpation. Wound #14 status is Open. Original cause of wound was Gradually Appeared. The date acquired was: 03/24/2023. The wound has been in treatment 2 weeks. The wound is located on the Left T Third. The wound measures 0.4cm length x 0.4cm width x 0.1cm depth; 0.126cm^2 area and 0.013cm^3 volume. There is oe bone and Fat Layer (Subcutaneous Tissue) exposed. There is no tunneling or undermining noted. There is a medium amount of serosanguineous drainage noted. The wound margin is distinct with the outline attached to the wound base. There is large (67-100%) red granulation within the wound bed. There is a  small (1-33%) amount of necrotic tissue within the wound bed including Adherent Slough. The periwound skin appearance exhibited: Scarring, Hemosiderin Staining. The periwound skin appearance did not exhibit: Dry/Scaly, Maceration. Periwound temperature was noted as No Abnormality. The periwound has tenderness on palpation. Wound #15 status is Open. Original cause of wound was Gradually Appeared. The date acquired was: 03/26/2023. The wound has been in treatment 2 weeks. The wound is located on the Right,Dorsal T [place]. The wound measures 0.6cm length x 0.8cm width x 0.1cm depth; 0.377cm^2 area and 0.038cm^3 volume. oe There is Fat Layer (Subcutaneous Tissue) exposed. There is no tunneling or undermining noted. There is a medium amount of serosanguineous drainage noted. The wound margin is flat and intact. There is large (67-100%) red granulation within the wound bed. There is a small (1-33%) amount of necrotic tissue within the wound bed including Adherent Slough. The periwound skin appearance had no abnormalities noted for texture. The periwound skin appearance had no abnormalities noted for moisture. The periwound skin appearance had no abnormalities noted for color. Periwound temperature was noted as No Abnormality. The periwound has tenderness on palpation. Wound #9 status is Open. Original cause of wound was Pressure Injury. The date acquired was: 10/22/2022. The wound has been in treatment 4 weeks. The wound is located on the Left Calcaneus. The wound measures 0.7cm length x 0.6cm width x 0.1cm depth; 0.33cm^2 area and 0.033cm^3 volume. There is Fat Layer (Subcutaneous Tissue) exposed. There is no tunneling or undermining  noted. There is a small amount of serous drainage noted. The wound margin is distinct with the outline attached to the wound base. There is small (1-33%) pink, pale granulation within the wound bed. There is a large (67-100%) amount of necrotic tissue within the wound bed  including Adherent Slough. The periwound skin appearance had no abnormalities noted for texture. The periwound skin appearance had no abnormalities noted for moisture. The periwound skin appearance had no abnormalities noted for color. Periwound temperature was noted as No Abnormality. The periwound has tenderness on palpation. 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 There was a Selective/Open Wound Non-Viable Tissue oe Debridement with a total area of 0.38 sq cm performed by Megan Guess, MD. With the following instrument(s): Curette to remove Non-Viable tissue/material. Material removed includes [facility] after achieving pain control using Lidocaine 4% Topical Solution. No specimens were taken. A time out was conducted at 15:20, [name] to the start of the procedure. A Minimum amount of bleeding was controlled with Pressure. The procedure was tolerated well with a pain level of 2 throughout and a pain level of 1 following the procedure. Post Debridement Measurements: 0.6cm length x 0.8cm width x 0.1cm depth; 0.038cm^3 volume. Post debridement Stage noted as Category/Stage IV. Character of Wound/Ulcer Post Debridement is improved. Post procedure Diagnosis Wound #10: Same as Pre-Procedure General Notes: scribed for Dr. Lady [name] by Megan Deed, RN. Wound #11 Pre-procedure diagnosis of Wound #11 is a Pressure  Ulcer located on the Right T Third . There was a Selective/Open Wound Non-Viable Tissue Debridement oe with a total area of 0.57 sq cm performed by Megan Guess, MD. With the following instrument(s): Curette to remove Non-Viable tissue/material. Material removed includes Eschar and Slough and after achieving pain control using Lidocaine 4% T opical Solution. No specimens were taken. A time out was conducted at 15:20, [name] to the start of the procedure. A Minimum amount of bleeding was controlled with Pressure. The procedure was tolerated well with a Test, Jenalyn [name] ([id number]) [id number] Page 15 of 19 pain level of 2 throughout and a pain level of 1 following the procedure. Post Debridement Measurements: 0.8cm length x 0.9cm width x 0.1cm depth; 0.057cm^3 volume. Post debridement Stage noted as Category/Stage IV. Character of Wound/Ulcer Post Debridement is improved. Post procedure Diagnosis Wound #11: Same as Pre-Procedure General Notes: scribed for Dr. Lady [name] by Megan Deed, RN. Wound #12 Pre-procedure diagnosis of Wound #12 is an Arterial Insufficiency Ulcer located on the Left T Great .Severity of Tissue Pre Debridement is: Fat layer oe exposed. There was a Selective/Open Wound Skin/Epidermis Debridement with a total area of 0.16 sq cm performed by Megan Guess, MD. With the following instrument(s): Curette to remove Non-Viable tissue/material. Material removed includes Eschar and Skin: Epidermis and after achieving pain control using Lidocaine 4% T opical Solution. No specimens were taken. A time out was conducted at 15:20, [name] to the start of the procedure. A Minimum amount of bleeding was controlled with Pressure. The procedure was tolerated well  with a pain level of 2 throughout and a pain level of 1 following the procedure. Post Debridement Measurements: 0.4cm length x 0.5cm width x 0.1cm depth; 0.016cm^3 volume. Character of Wound/Ulcer Post  Debridement is improved. Severity of Tissue Post Debridement is: Fat layer exposed. Post procedure Diagnosis Wound #12: Same as Pre-Procedure General Notes: scribed for Dr. Lady [name] by Megan Deed, RN. Wound #13 Pre-procedure diagnosis of Wound #13 is an Arterial Insufficiency Ulcer located on the Left,Dorsal T Great .Severity of Tissue Pre Debridement is: Fat layer oe exposed. There was a Selective/Open Wound Non-Viable Tissue Debridement with a total area of 0.52 sq cm performed by Megan Guess, MD. With the following instrument(s): Curette to remove Non-Viable tissue/material. Material removed includes Eschar and Slough and after achieving pain control using Lidocaine 4% T opical Solution. No specimens were taken. A time out was conducted at 15:20, [name] to the start of the procedure. A Minimum amount of bleeding was controlled with Pressure. The procedure was tolerated well with a pain level of 2 throughout and a pain level of 1 following the procedure. Post Debridement Measurements: 0.6cm length x 1.1cm width x 0.2cm depth; 0.104cm^3 volume. Character of Wound/Ulcer Post Debridement is improved. Severity of Tissue Post Debridement is: Bone involvement without necrosis. Post procedure Diagnosis Wound #13: Same as Pre-Procedure General Notes: scribed for Dr. Lady [name] by Megan Deed, RN. Wound #14 Pre-procedure diagnosis of Wound #14 is an Arterial Insufficiency Ulcer located on the Left T Third .Severity of Tissue Pre Debridement is: Bone oe involvement without necrosis. There was a Selective/Open Wound Non-Viable Tissue Debridement with a total area of 0.13 sq cm performed by Megan Guess, MD. With the following instrument(s): Curette to remove Non-Viable tissue/material. Material removed includes [facility] after achieving pain control using Lidocaine 4% T opical Solution. No specimens were taken. A time out was conducted at 15:20, [name] to the start of the procedure. A Minimum amount  of bleeding was controlled with Pressure. The procedure was tolerated well with a pain level of 2 throughout and a pain level of 1 following the procedure. Post Debridement Measurements: 0.4cm length x 0.4cm width x 0.1cm depth; 0.013cm^3 volume. Character of Wound/Ulcer Post Debridement is improved. Severity of Tissue Post Debridement is: Bone involvement without necrosis. Post procedure Diagnosis Wound #14: Same as Pre-Procedure General Notes: scribed for Dr. Lady [name] by Megan Deed, RN. Wound #15 Pre-procedure diagnosis of Wound #15 is an Arterial Insufficiency Ulcer located on the Right,Dorsal T Great .Severity of Tissue Pre Debridement is: Bone oe involvement without necrosis. There was a Selective/Open Wound Non-Viable Tissue Debridement with a total area of 0.38 sq cm performed by Megan Guess, MD. With the following instrument(s): Curette to remove Non-Viable tissue/material. Material removed includes Eschar and Slough and after achieving pain control using Lidocaine 4% T opical Solution. No specimens were taken. A time out was conducted at 15:20, [name] to the start of the procedure. A Minimum amount of bleeding was controlled with Pressure. The procedure was tolerated well with a pain level of 2 throughout and a pain level of 1 following the procedure. Post Debridement Measurements: 0.6cm length x 0.8cm width x 0.1cm depth; 0.038cm^3 volume. Character of Wound/Ulcer Post Debridement is improved. Severity of Tissue Post Debridement is: Bone involvement without necrosis. Post procedure Diagnosis Wound #15: Same as Pre-Procedure General Notes: scribed for Dr. Lady [name] by Megan Deed, RN. Wound #9 Pre-procedure diagnosis of Wound #9 is a Pressure Ulcer located on the Left Calcaneus . There was a Selective/Open Wound Non-Viable  Tissue Debridement with a total area of 0.33 sq cm performed by Megan Guess, MD. With the following instrument(s): Curette to remove Non-Viable  tissue/material. Material removed includes [facility] after achieving pain control using Lidocaine 4% Topical Solution. No specimens were taken. A time out was conducted at 15:20, [name] to the start of the procedure. A Minimum amount of bleeding was controlled with Pressure. The procedure was tolerated well with a pain level of 2 throughout and a pain level of 1 following the procedure. Post Debridement Measurements: 0.7cm length x 0.6cm width x 0.1cm depth; 0.033cm^3 volume. Post debridement Stage noted as Category/Stage II. Character of Wound/Ulcer Post Debridement is improved. Post procedure Diagnosis Wound #9: Same as Pre-Procedure General Notes: scribed for Dr. Lady [name] by Megan Deed, RN. Plan Follow-up Appointments: Return Appointment in 1 week. - Dr. Lady [name] - room 1 Thursday 8/15 @ 2:30 pm Nurse Visit: - 8/8 @ 3:30 pm Anesthetic: (In clinic) Topical Lidocaine 4% applied to wound bed Bathing/ Shower/ Hygiene: May shower and wash wound with soap and water. Edema Control - Lymphedema / SCD / Other: Elevate legs to the level of the heart or above for 30 minutes daily and/or when sitting for 3-4 times a day throughout the day. Avoid standing for long periods of time. Patient to wear own compression stockings every day. Additional Orders / Instructions: Follow Nutritious Diet - add in protein shakes every day to diet - recommend premier protein 500 mg x3 a day vitamin C, zinc 30-50 mg per day Home Health: No change in wound care orders this week; continue Home Health for wound care. May utilize formulary equivalent dressing for wound treatment orders unless otherwise specified. Other Home Health Orders/Instructions: - Medi Home WOUND #10: - T Second Wound Laterality: Left oe Cleanser: Soap and Water 1 x Per Day/30 Days Discharge Instructions: May shower and wash wound with dial antibacterial soap and water [name] to dressing change. Cleanser: Wound Cleanser 1 x Per Day/30 Days Discharge  Instructions: Cleanse the wound with wound cleanser [name] to applying a clean dressing using gauze sponges, not tissue or cotton balls. Prim Dressing: Promogran Prisma Matrix, 4.34 (sq in) (silver collagen) (Generic) 1 x Per Day/30 Days ary Discharge Instructions: Moisten collagen with saline or hydrogel Langlinais, Noni [name] ([id number]) [id number] Page 16 of 19 Secondary Dressing: Woven Gauze Sponge, Non-Sterile 4x4 in (Generic) 1 x Per Day/30 Days Discharge Instructions: Apply over primary dressing as directed. Secured With: 67M Medipore H Soft Cloth Surgical T ape, 4 x 10 (in/yd) (Generic) 1 x Per Day/30 Days Discharge Instructions: Secure with tape as directed. WOUND #11: - T Third Wound Laterality: Right oe Cleanser: Soap and Water 1 x Per Day/30 Days Discharge Instructions: May shower and wash wound with dial antibacterial soap and water [name] to dressing change. Cleanser: Wound Cleanser 1 x Per Day/30 Days Discharge Instructions: Cleanse the wound with wound cleanser [name] to applying a clean dressing using gauze sponges, not tissue or cotton balls. Prim Dressing: Promogran Prisma Matrix, 4.34 (sq in) (silver collagen) (Generic) 1 x Per Day/30 Days ary Discharge Instructions: Moisten collagen with saline or hydrogel Secondary Dressing: Woven Gauze Sponge, Non-Sterile 4x4 in (Generic) 1 x Per Day/30 Days Discharge Instructions: Apply over primary dressing as directed. Secured With: 67M Medipore H Soft Cloth Surgical T ape, 4 x 10 (in/yd) (Generic) 1 x Per Day/30 Days Discharge Instructions: Secure with tape as directed. WOUND #12: - T Great Wound Laterality: Left oe Cleanser: Soap and Water 1 x Per Day/30 Days  Discharge Instructions: May shower and wash wound with dial antibacterial soap and water [name] to dressing change. Cleanser: Wound Cleanser 1 x Per Day/30 Days Discharge Instructions: Cleanse the wound with wound cleanser [name] to applying a clean dressing  using gauze sponges, not tissue or cotton balls. Prim Dressing: Promogran Prisma Matrix, 4.34 (sq in) (silver collagen) (Generic) 1 x Per Day/30 Days ary Discharge Instructions: Moisten collagen with saline or hydrogel Secondary Dressing: Woven Gauze Sponge, Non-Sterile 4x4 in (Generic) 1 x Per Day/30 Days Discharge Instructions: Apply over primary dressing as directed. Secured With: 26M Medipore H Soft Cloth Surgical T ape, 4 x 10 (in/yd) (Generic) 1 x Per Day/30 Days Discharge Instructions: Secure with tape as directed. WOUND #13: - T Great Wound Laterality: Dorsal, Left oe Cleanser: Soap and Water 1 x Per Day/30 Days Discharge Instructions: May shower and wash wound with dial antibacterial soap and water [name] to dressing change. Cleanser: Wound Cleanser 1 x Per Day/30 Days Discharge Instructions: Cleanse the wound with wound cleanser [name] to applying a clean dressing using gauze sponges, not tissue or cotton balls. Prim Dressing: Promogran Prisma Matrix, 4.34 (sq in) (silver collagen) (Generic) 1 x Per Day/30 Days ary Discharge Instructions: Moisten collagen with saline or hydrogel Secondary Dressing: Woven Gauze Sponge, Non-Sterile 4x4 in (Generic) 1 x Per Day/30 Days Discharge Instructions: Apply over primary dressing as directed. Secured With: 26M Medipore H Soft Cloth Surgical T ape, 4 x 10 (in/yd) (Generic) 1 x Per Day/30 Days Discharge Instructions: Secure with tape as directed. WOUND #14: - T Third Wound Laterality: Left oe Cleanser: Soap and Water 1 x Per Day/30 Days Discharge Instructions: May shower and wash wound with dial antibacterial soap and water [name] to dressing change. Cleanser: Wound Cleanser 1 x Per Day/30 Days Discharge Instructions: Cleanse the wound with wound cleanser [name] to applying a clean dressing using gauze sponges, not tissue or cotton balls. Prim Dressing: Promogran Prisma Matrix, 4.34 (sq in) (silver collagen) (Generic) 1 x Per Day/30  Days ary Discharge Instructions: Moisten collagen with saline or hydrogel Secondary Dressing: Woven Gauze Sponge, Non-Sterile 4x4 in (Generic) 1 x Per Day/30 Days Discharge Instructions: Apply over primary dressing as directed. Secured With: 26M Medipore H Soft Cloth Surgical T ape, 4 x 10 (in/yd) (Generic) 1 x Per Day/30 Days Discharge Instructions: Secure with tape as directed. WOUND #15: - T Great Wound Laterality: Dorsal, Right oe Cleanser: Soap and Water 1 x Per Day/30 Days Discharge Instructions: May shower and wash wound with dial antibacterial soap and water [name] to dressing change. Cleanser: Wound Cleanser 1 x Per Day/30 Days Discharge Instructions: Cleanse the wound with wound cleanser [name] to applying a clean dressing using gauze sponges, not tissue or cotton balls. Prim Dressing: Promogran Prisma Matrix, 4.34 (sq in) (silver collagen) (Generic) 1 x Per Day/30 Days ary Discharge Instructions: Moisten collagen with saline or hydrogel Secondary Dressing: Woven Gauze Sponge, Non-Sterile 4x4 in (Generic) 1 x Per Day/30 Days Discharge Instructions: Apply over primary dressing as directed. Secured With: 26M Medipore H Soft Cloth Surgical T ape, 4 x 10 (in/yd) (Generic) 1 x Per Day/30 Days Discharge Instructions: Secure with tape as directed. WOUND #9: - Calcaneus Wound Laterality: Left Cleanser: Soap and Water 1 x Per Day/30 Days Discharge Instructions: May shower and wash wound with dial antibacterial soap and water [name] to dressing change. Cleanser: Wound Cleanser 1 x Per Day/30 Days Discharge Instructions: Cleanse the wound with wound cleanser [name] to applying a clean dressing using gauze sponges,  not tissue or cotton balls. Prim Dressing: Promogran Prisma Matrix, 4.34 (sq in) (silver collagen) (Generic) 1 x Per Day/30 Days ary Discharge Instructions: Moisten collagen with saline or hydrogel Secondary Dressing: ALLEVYN Heel 4 1/2in x 5 1/2in / 10.5cm x 13.5cm (Generic) 1 x Per  Day/30 Days Discharge Instructions: Apply over primary dressing as directed. Secondary Dressing: Woven Gauze Sponge, Non-Sterile 4x4 in (Generic) 1 x Per Day/30 Days Discharge Instructions: Apply over primary dressing as directed. Secured With: 55M Medipore H Soft Cloth Surgical T ape, 4 x 10 (in/yd) (Generic) 1 x Per Day/30 Days Discharge Instructions: Secure with tape as directed. 04/09/2023: No real change to any of her wounds today. I used a curette to debride slough from all of the wounds with the exception of the 2 on her left great toe. I debrided eschar and slough from the dorsal wound which ended up revealing exposed tendon and bone. I debrided eschar and callus from the wound near the tip of her toe. We will continue Prisma silver collagen to all sites. She will complete the oral course of doxycycline that was prescribed. Follow-up in 1 week. Electronic Signature(s) Signed: 04/09/2023 3:40:52 PM By: Megan Guess MD FACS Entered By: [name] on 04/09/2023 15:40:52 Zerkle, [name] ([id number]) [id number] Page 17 of 19 -------------------------------------------------------------------------------- HxROS Details Patient Name: Date of Service: [name], [name] 04/09/2023 2:45 PM Medical Record Number: [id number] Patient Account Number: [id number] Date of Birth/Sex: Treating RN: 1928-03-25 (89 y.o. F) Primary Care Provider: Jorge [name] Other Clinician: Referring Provider: Treating Provider/Extender: Tiajuana Amass in Treatment: 4 Information Obtained From Patient Caregiver Chart Eyes Medical History: Positive for: Cataracts Hematologic/Lymphatic Medical History: Positive for: Anemia Past Medical History Notes: Thrombocytopenia, Anticoagulant long-term use Respiratory Medical History: Negative for: Chronic Obstructive Pulmonary Disease (COPD) Past Medical History Notes: Pulmonary embolus Cardiovascular Medical  History: Positive for: Arrhythmia - a-fib; Congestive Heart Failure; Hypotension; Peripheral Venous Disease Gastrointestinal Medical History: Past Medical History Notes: Hiatal hernia Endocrine Medical History: Past Medical History Notes: Hyperthyroidism, Hypothyroidism Genitourinary Medical History: Past Medical History Notes: CKD stage III Musculoskeletal Medical History: Positive for: 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 Past Medical History Notes: arthritis Neurologic Medical History: Positive for: Neuropathy HBO Extended History Items Eyes: Cataracts Immunizations Steve, [name] ([id number]) [id number] Page 18 of 19 Pneumococcal Vaccine: Received Pneumococcal Vaccination: No Implantable Devices None Hospitalization / Surgery History Type of Hospitalization/Surgery Abdominal aortogram w/lower extremity Amputation toe (Right) Colonoscopy Elbow surgery Laparoscopic hysterectomy Family and Social History Cancer: Yes - Siblings; Diabetes: No; Heart Disease: Yes; Stroke: Yes - Mother; Former smoker - quit 2003; Marital Status - Widowed; Alcohol Use: Never; Drug Use: No History; Caffeine Use: Never; Financial Concerns: No; Food, Clothing or Shelter Needs: No; Support System Lacking: No; Transportation Concerns: No Electronic Signature(s) Signed: 04/09/2023 3:45:57 PM By: Megan Guess MD FACS Entered By: [name] on 04/09/2023 15:38:09 -------------------------------------------------------------------------------- SuperBill Details Patient Name: Date of Service: [name]. 04/09/2023 Medical Record Number: [id number] Patient Account Number: [id number] Date of Birth/Sex: Treating RN: Dec 29, 1927 (89 y.o. 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 without evidence of necrosis L97.522 Non-pressure chronic ulcer of other  part of left foot with fat layer exposed I73.9 Peripheral vascular disease, unspecified I50.32 Chronic diastolic (congestive) heart failure N18.32 Chronic kidney disease, stage 3b I87.2 Venous insufficiency (chronic) (peripheral) Facility Procedures : CPT4 Code: [id number] Description: [id number] - DEBRIDE WOUND 1ST 20 SQ CM OR < ICD-10 Diagnosis Description L89.623 Pressure ulcer of left heel, 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 with L97.526 Non-pressure chronic ulcer of  other part of left foot with bone involvement witho L97.522 Non-pressure chronic ulcer of other part of left foot with fat layer exposed Modifier: out evidence of nec ut evidence of necr Quantity: 1 rosis osis Physician Procedures : CPT4 Code Description Modifier [id number] - WC PHYS LEVEL 4 - EST PT 25 ICD-10 Diagnosis Description L89.623 Pressure ulcer of left heel, 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 without evidence of necro Cundy, Latrelle [name] ([id number]) [id number] [phone] Non-pressure chronic ulcer of other part of left foot with  fat layer exposed Quantity: 1 osis sis Page 19 of 19 : [id number] - WC PHYS DEBR WO ANESTH 20 SQ CM 1 ICD-10 Diagnosis Description L89.623 Pressure ulcer of left heel, 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

## 2023-04-09 NOTE — Progress Notes (Signed)
Megan, [name] ([id number]) [id number] Page 1 of 17 Visit Report for 04/09/2023 Arrival Information Details Patient Name: Date of Service: Megan, [name] 04/09/2023 2:45 PM Medical Record Number: [id number] Patient Account Number: [id number] Date of Birth/Sex: Treating RN: 05/15/1928 (89 y.o. [name], [name] Primary Care Florian Chauca: [name] Other Clinician: Referring Lluvia Gwynne: Treating Ronen Bromwell/Extender: Claudie Leach Weeks in Treatment: 4 Visit Information History Since Last Visit Added or deleted any medications: Yes Patient Arrived: Wheel Chair Any new allergies or adverse reactions: No Arrival Time: 14:42 Had a fall or experienced change in No Accompanied By: daughter activities of daily living that may affect Transfer Assistance: None risk of falls: Patient Identification Verified: Yes Signs or symptoms of abuse/neglect since last visito No Secondary Verification Process Completed: Yes Hospitalized since last visit: No Patient Requires Transmission-Based Precautions: No Implantable device outside of the clinic excluding No Patient Has Alerts: Yes cellular tissue based products placed in the center Patient Alerts: ABI R: 0.48 L: 0.46 2/24 since last visit: TBI R: 0.39 L: 0.21 2/24 Has Dressing in Place as Prescribed: Yes Pain Present Now: No Electronic Signature(s) Signed: 04/09/2023 4:06:41 PM By: Zenaida Deed RN, BSN Entered By: Zenaida Deed on 04/09/2023 14:58:16 -------------------------------------------------------------------------------- Encounter Discharge Information Details Patient Name: Date of Service: Megan Reap. 04/09/2023 2:45 PM Medical Record Number: [id number] Patient Account Number: [id number] Date of Birth/Sex: Treating RN: 08/17/1928 (89 y.o. [name] Primary Care Jaydeen Darley: [name] Other Clinician: Referring Jatniel Verastegui: Treating Kayela Humphres/Extender: Claudie Leach Weeks in Treatment: 4 Encounter Discharge Information Items Post Procedure Vitals Discharge Condition: Stable Temperature (F): 98.2 Ambulatory Status: Wheelchair Pulse (bpm): 74 Discharge Destination: Home Respiratory Rate (breaths/min): 18 Transportation: Private Auto Blood Pressure (mmHg): 126/72 Accompanied By: daughter Schedule Follow-up Appointment: Yes Clinical Summary of Care: Patient Declined Electronic Signature(s) Signed: 04/09/2023 4:06:41 PM By: Zenaida Deed RN, BSN Entered By: Zenaida Deed on 04/09/2023 16:02:44 [name], [name] ([id number]) [id number] Page 2 of 17 -------------------------------------------------------------------------------- Lower Extremity Assessment Details Patient Name: Date of Service: Megan, [name] 04/09/2023 2:45 PM Medical Record Number: [id number] Patient Account Number: [id number] Date of Birth/Sex: Treating RN: 08/08/1928 (89 y.o. [name] Primary Care Takiah Maiden: [name] Other Clinician: Referring Samyah Bilbo: Treating Zyheir Daft/Extender: Claudie Leach Weeks in Treatment: 4 Edema Assessment Assessed: [Left: No] [Right: No] Edema: [Left: Yes] [Right: Yes] Calf Left: Right: Point of Measurement: From Medial Instep 35.5 cm 37 cm Ankle Left: Right: Point of Measurement: From Medial Instep 21.5 cm 20.5 cm Vascular Assessment Pulses: Dorsalis Pedis Palpable: [Left:No] [Right:No] Extremity colors, hair growth, and conditions: Extremity Color: [Left:Hyperpigmented] [Right:Hyperpigmented] Hair Growth on Extremity: [Left:No] [Right:No] Temperature of Extremity: [Left:Warm < 3 seconds] [Right:Warm < 3 seconds] Electronic Signature(s) Signed: 04/09/2023 4:06:41 PM By: Zenaida Deed RN, BSN Entered By: Zenaida Deed on 04/09/2023 15:13:36 -------------------------------------------------------------------------------- Multi Wound Chart Details Patient Name: Date of  Service: Megan Reap. 04/09/2023 2:45 PM Medical Record Number: [id number] Patient Account Number: [id number] Date of Birth/Sex: Treating RN: 09-04-1928 (89 y.o. F) Primary Care Makyna Niehoff: [name] Other Clinician: Referring Graycee Greeson: Treating Netra Postlethwait/Extender: Claudie Leach Weeks in Treatment: 4 Vital Signs Height(in): 64 Pulse(bpm): 74 Weight(lbs): 157 Blood Pressure(mmHg): 126/72 Body Mass Index(BMI): 26.9 Temperature(F): 98.2 Respiratory Rate(breaths/min): 18 [10:Photos:] [id number] Page 3 of 17] Left T Second oe Right T Third oe Left T Great oe Wound Location: Pressure Injury Pressure Injury Gradually Appeared Wounding Event: Pressure Ulcer Pressure Ulcer Arterial Insufficiency Ulcer Primary Etiology: Cataracts, Anemia, Arrhythmia, Cataracts,  Anemia, Arrhythmia, Cataracts, Anemia, Arrhythmia, Comorbid History: Congestive Heart Failure, Congestive Heart Failure, Congestive Heart Failure, Hypotension, Peripheral Venous Hypotension, Peripheral Venous Hypotension, Peripheral Venous Disease, Osteomyelitis, Neuropathy Disease, Osteomyelitis, Neuropathy Disease, Osteomyelitis, Neuropathy 10/09/2022 10/09/2022 03/17/2023 Date Acquired: 4 4 3  Weeks of Treatment: Open Open Open Wound Status: No No No Wound Recurrence: 0.6x0.8x0.1 0.8x0.9x0.1 0.4x0.5x0.1 Measurements L x W x D (cm) 0.377 0.565 0.157 A (cm) : rea 0.038 0.057 0.016 Volume (cm) : 46.70% 20.10% 68.30% % Reduction in A rea: 46.50% 19.70% 67.30% % Reduction in Volume: 12 Starting Position 1 (o'clock): 12 Ending Position 1 (o'clock): 0.3 Maximum Distance 1 (cm): No No Yes Undermining: Category/Stage IV Category/Stage IV Full Thickness Without Exposed Classification: Support Structures Small Medium Small Exudate A mount: Serosanguineous Serosanguineous Purulent Exudate Type: red, brown red, brown yellow, brown, green Exudate Color: Distinct, outline  attached Distinct, outline attached Flat and Intact Wound Margin: Large (67-100%) Large (67-100%) Small (1-33%) Granulation Amount: Red Red Pink Granulation Quality: Small (1-33%) Small (1-33%) Large (67-100%) Necrotic Amount: Adherent Slough Adherent [employer] Necrotic Tissue: Fat Layer (Subcutaneous Tissue): Yes Fat Layer (Subcutaneous Tissue): Yes Fat Layer (Subcutaneous Tissue): Yes Exposed Structures: Joint: Yes Joint: Yes Fascia: No Bone: Yes Bone: Yes Tendon: No Fascia: No Fascia: No Muscle: No Tendon: No Tendon: No Joint: No Muscle: No Muscle: No Bone: No Small (1-33%) None None Epithelialization: No Abnormalities Noted No Abnormalities Noted No Abnormalities Noted Periwound Skin Texture: Dry/Scaly: Yes Dry/Scaly: Yes No Abnormalities Noted Periwound Skin Moisture: No Abnormalities Noted Rubor: No Erythema: No Periwound Skin Color: N/A N/A N/A Erythema Location: No Abnormality No Abnormality No Abnormality Temperature: N/A Yes Yes Tenderness on Palpation: Wound Number: 13 [id number] 15  Photos: Left, Dorsal T Great oe Left T Third oe Right, Dorsal T Great oe Wound Location: Gradually Appeared Gradually Appeared Gradually Appeared Wounding 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 Etiology: Cataracts, Anemia, Arrhythmia, Cataracts, Anemia, Arrhythmia, Cataracts, Anemia, Arrhythmia, Comorbid History: Congestive Heart Failure, Congestive Heart Failure, Congestive Heart Failure, Hypotension, Peripheral Venous Hypotension, Peripheral Venous Hypotension, Peripheral Venous Disease, Osteomyelitis, Neuropathy Disease, Osteomyelitis, Neuropathy Disease, Osteomyelitis, Neuropathy 03/24/2023 03/24/2023 03/26/2023 Date Acquired: 2 2 2  Weeks of Treatment: Open Open Open Wound Status: No No No Wound Recurrence: 0.6x1.1x0.1 0.4x0.4x0.1 0.6x0.8x0.1 Measurements L x W x D (cm) 0.518 0.126 0.377 A  (cm) : rea 0.052 0.013 0.038 Volume (cm) : -555.70% 0.00% -431.00% % Reduction in Area: -550.00% 0.00% -442.90% % Reduction in Volume: No No No Undermining: Full Thickness Without Exposed Full Thickness With Exposed Support Full Thickness Without Exposed Classification: Support Structures Structures Support Structures Medium Medium Medium Exudate Amount: Serosanguineous Serosanguineous Serosanguineous Exudate Type: red, brown red, brown red, brown Exudate Color: Distinct, outline attached Distinct, outline attached Flat and Intact Wound Margin: Small (1-33%) Large (67-100%) Large (67-100%) Granulation Amount: Ferrer, Karema Judie Petit ([id number]) [id number] Page 4 of 17 Red Red Red Granulation Quality: Large (67-100%) Small (1-33%) Small (1-33%) Necrotic Amount: Eschar, Adherent Slough Adherent [employer] Necrotic Tissue: Fat Layer (Subcutaneous Tissue): Yes Fat Layer (Subcutaneous Tissue): Yes Fat Layer (Subcutaneous Tissue): Yes Exposed Structures: Bone: Yes Fascia: No Tendon: No Muscle: No Joint: No Bone: No None None Small (1-33%) Epithelialization: No Abnormalities Noted Scarring: Yes No Abnormalities Noted Periwound Skin Texture: No Abnormalities Noted Maceration: No No Abnormalities Noted Periwound Skin Moisture: Dry/Scaly: No Erythema: Yes Hemosiderin Staining: Yes No Abnormalities Noted Periwound Skin Color: Circumferential N/A N/A Erythema Location: No Abnormality No Abnormality No Abnormality Temperature: Yes Yes Yes Tenderness  on Palpation: Wound Number: 9 N/A N/A Photos: N/A N/A Left Calcaneus N/A N/A Wound Location: Pressure Injury N/A N/A Wounding Event: Pressure Ulcer N/A N/A Primary Etiology: Cataracts, Anemia, Arrhythmia, N/A N/A Comorbid History: Congestive Heart Failure, Hypotension, Peripheral Venous Disease, Osteomyelitis, Neuropathy 10/22/2022 N/A N/A Date Acquired: 4 N/A N/A Weeks of  Treatment: Open N/A N/A Wound Status: No N/A N/A Wound Recurrence: 0.7x0.6x0.1 N/A N/A Measurements L x W x D (cm) 0.33 N/A N/A A (cm) : rea 0.033 N/A N/A Volume (cm) : 0.00% N/A N/A % Reduction in A rea: 66.70% N/A N/A % Reduction in Volume: No N/A N/A Undermining: Category/Stage II N/A N/A Classification: Small N/A N/A Exudate A mount: Serous N/A N/A Exudate Type: amber N/A N/A Exudate Color: Distinct, outline attached N/A N/A Wound Margin: Small (1-33%) N/A N/A Granulation A mount: Pink, Pale N/A N/A Granulation Quality: Large (67-100%) N/A N/A Necrotic A mount: Adherent Slough N/A N/A Necrotic Tissue: Fat Layer (Subcutaneous Tissue): Yes N/A N/A Exposed Structures: Fascia: No Tendon: No Muscle: No Joint: No Bone: No Small (1-33%) N/A N/A Epithelialization: Callus: No N/A N/A Periwound Skin Texture: Dry/Scaly: Yes N/A N/A Periwound Skin Moisture: No Abnormalities Noted N/A N/A Periwound Skin Color: N/A N/A N/A Erythema Location: No Abnormality N/A N/A Temperature: Yes N/A N/A Tenderness on Palpation: Treatment Notes Electronic Signature(s) Signed: 04/09/2023 3:18:46 PM By: Duanne Guess MD FACS Entered By: Duanne Guess on 04/09/2023 15:18:46 [name], [name] ([id number]) [id number] Page 5 of 17 -------------------------------------------------------------------------------- Multi-Disciplinary Care Plan Details Patient Name: Date of Service: Megan, [name] 04/09/2023 2:45 PM Medical Record Number: [id number] Patient Account Number: [id number] Date of Birth/Sex: Treating RN: November 22, 1927 (89 y.o. [name] Primary Care Zahir Eisenhour: [name] Other Clinician: Referring Gracyn Allor: Treating Raed Schalk/Extender: Tiajuana Amass in Treatment: 4 Multidisciplinary Care Plan reviewed with physician Active Inactive Abuse / Safety / Falls / Self Care Management Nursing Diagnoses: Impaired home  maintenance Impaired physical mobility Potential for falls Goals: Patient/caregiver will verbalize/demonstrate measure taken to improve self care Date Initiated: 03/10/2023 Target Resolution Date: 05/15/2023 Goal Status: Active Patient/caregiver will verbalize/demonstrate measures taken to improve the patient's personal safety Date Initiated: 03/10/2023 Target Resolution Date: 05/15/2023 Goal Status: Active Interventions: Assess fall risk on admission and as needed Assess: immobility, friction, shearing, incontinence upon admission and as needed Provide education on fall prevention Notes: Wound/Skin Impairment Nursing Diagnoses: Impaired tissue integrity Knowledge deficit related to ulceration/compromised skin integrity Goals: Patient/caregiver will verbalize understanding of skin care regimen Date Initiated: 03/10/2023 Target Resolution Date: 05/15/2023 Goal Status: Active Interventions: Assess patient/caregiver ability to obtain necessary supplies Assess patient/caregiver ability to perform ulcer/skin care regimen upon admission and as needed Assess ulceration(s) every visit Treatment Activities: Skin care regimen initiated : 03/10/2023 Topical wound management initiated : 03/10/2023 Notes: Electronic Signature(s) Signed: 04/09/2023 4:06:41 PM By: Zenaida Deed RN, BSN Entered By: Zenaida Deed on 04/09/2023 14:43:46 [name], [name] ([id number]) [id number] Page 6 of 17 -------------------------------------------------------------------------------- Pain Assessment Details Patient Name: Date of Service: Megan, [name] 04/09/2023 2:45 PM Medical Record Number: [id number] Patient Account Number: [id number] Date of Birth/Sex: Treating RN: 1927/09/21 (89 y.o. [name] Primary Care Andris Brothers: [name] Other Clinician: Referring Johari Bennetts: Treating Nazar Kuan/Extender: Claudie Leach Weeks in Treatment: 4 Active Problems Location of  Pain Severity and Description of Pain Patient Has Paino No Site Locations Rate the pain. Current Pain Level: 0 Pain Management and Medication Current Pain Management: Electronic Signature(s) Signed: 04/09/2023 4:06:41 PM By: Zenaida Deed RN, BSN Entered  By: Zenaida Deed on 04/09/2023 14:58:27 -------------------------------------------------------------------------------- Patient/Caregiver Education Details Patient Name: Date of Service: Megan, [name] 8/1/2024andnbsp2:45 PM Medical Record Number: [id number] Patient Account Number: [id number] Date of Birth/Gender: Treating RN: [birth year] (89 y.o. [name] Primary Care Physician: [name] Other Clinician: Referring Physician: Treating Physician/Extender: Tiajuana Amass in Treatment: 4 Education Assessment Education Provided To: Patient Education Topics Provided Tissue Oxygenation: Methods: Explain/Verbal Responses: Reinforcements needed, State content correctly BIRGIT, BOWLAN ([id number]) [id number] Page 7 of 17 Wound/Skin Impairment: Methods: Explain/Verbal Responses: Reinforcements needed, State content correctly Electronic Signature(s) Signed: 04/09/2023 4:06:41 PM By: Zenaida Deed RN, BSN Entered By: Zenaida Deed on 04/09/2023 14:44:22 -------------------------------------------------------------------------------- Wound Assessment Details Patient Name: Date of Service: Megan Reap. 04/09/2023 2:45 PM Medical Record Number: [id number] Patient Account Number: [id number] Date of Birth/Sex: Treating RN: Feb 27, 1928 (89 y.o. [name], [name] Primary Care Kyron Schlitt: [name] Other Clinician: Referring Terrie Haring: Treating Bryer Cozzolino/Extender: Claudie Leach Weeks in Treatment: 4 Wound Status Wound Number: 10 Primary Pressure Ulcer Etiology: Wound Location: Left T Second oe Wound Open Wounding Event: Pressure Injury Status: Date  Acquired: 10/09/2022 Comorbid Cataracts, Anemia, Arrhythmia, Congestive Heart Failure, Weeks Of Treatment: 4 History: Hypotension, Peripheral Venous Disease, Osteomyelitis, Clustered Wound: No Neuropathy Photos Wound Measurements Length: (cm) 0.6 Width: (cm) 0.8 Depth: (cm) 0.1 Area: (cm) 0.377 Volume: (cm) 0.038 % Reduction in Area: 46.7% % Reduction in Volume: 46.5% Epithelialization: Small (1-33%) Tunneling: No Undermining: No Wound Description Classification: Category/Stage IV Wound Margin: Distinct, outline attached Exudate Amount: Small Exudate Type: Serosanguineous Exudate Color: red, brown Foul Odor After Cleansing: No Slough/Fibrino No Wound Bed Granulation Amount: Large (67-100%) Exposed Structure Granulation Quality: Red Fascia Exposed: No Necrotic Amount: Small (1-33%) Fat Layer (Subcutaneous Tissue) Exposed: Yes Necrotic Quality: Adherent Slough Tendon Exposed: No Muscle Exposed: No Joint Exposed: Yes Bone Exposed: [address] [place], Quintavia Judie Petit ([id number]) [id number] Page 8 of 17 Texture Color No Abnormalities Noted: Yes No Abnormalities Noted: Yes Moisture Temperature / Pain No Abnormalities Noted: Yes Temperature: No Abnormality Treatment Notes Wound #10 (Toe Second) Wound Laterality: Left Cleanser Soap and Water Discharge Instruction: May shower and wash wound with dial antibacterial soap and water prior to dressing change. Wound Cleanser Discharge Instruction: Cleanse the wound with wound cleanser prior to applying a clean dressing using gauze sponges, not tissue or cotton balls. Peri-Wound Care Topical Primary Dressing Promogran Prisma Matrix, 4.34 (sq in) (silver collagen) Discharge Instruction: Moisten collagen with saline or hydrogel Secondary Dressing Woven Gauze Sponge, Non-Sterile 4x4 in Discharge Instruction: Apply over primary dressing as directed. Secured With 40M Medipore H Soft Cloth Surgical  T ape, 4 x 10 (in/yd) Discharge Instruction: Secure with tape as directed. Compression Wrap Compression Stockings Add-Ons Electronic Signature(s) Signed: 04/09/2023 3:41:08 PM By: Dayton Scrape Signed: 04/09/2023 4:06:41 PM By: Zenaida Deed RN, BSN Entered By: Dayton Scrape on 04/09/2023 15:11:00 -------------------------------------------------------------------------------- Wound Assessment Details Patient Name: Date of Service: Megan Reap. 04/09/2023 2:45 PM Medical Record Number: [id number] Patient Account Number: [id number] Date of Birth/Sex: Treating RN: 16-Jun-1928 (89 y.o. [name] Primary Care Davelyn Gwinn: [name] Other Clinician: Referring Keano Guggenheim: Treating Nixxon Faria/Extender: Claudie Leach Weeks in Treatment: 4 Wound Status Wound Number: 11 Primary Pressure Ulcer Etiology: Wound Location: Right T Third oe Wound Open Wounding Event: Pressure Injury Status: Date Acquired: 10/09/2022 Comorbid Cataracts, Anemia, Arrhythmia, Congestive Heart Failure, Weeks Of Treatment: 4 History: Hypotension, Peripheral Venous Disease, Osteomyelitis, Clustered Wound: No Neuropathy Photos [name], [name] ([id number]) [id number]  Page 9 of 17 Wound Measurements Length: (cm) 0.8 Width: (cm) 0.9 Depth: (cm) 0.1 Area: (cm) 0.565 Volume: (cm) 0.057 % Reduction in Area: 20.1% % Reduction in Volume: 19.7% Epithelialization: None Tunneling: No Undermining: No Wound Description Classification: Category/Stage IV Wound Margin: Distinct, outline attached Exudate Amount: Medium Exudate Type: Serosanguineous Exudate Color: red, brown Foul Odor After Cleansing: No Slough/Fibrino Yes Wound Bed Granulation Amount: Large (67-100%) Exposed Structure Granulation Quality: Red Fascia Exposed: No Necrotic Amount: Small (1-33%) Fat Layer (Subcutaneous Tissue) Exposed: Yes Necrotic Quality: Adherent Slough Tendon Exposed: No Muscle Exposed:  No Joint Exposed: Yes Bone Exposed: Yes Periwound Skin Texture Texture Color No Abnormalities Noted: Yes No Abnormalities Noted: Yes Moisture Temperature / Pain No Abnormalities Noted: Yes Temperature: No Abnormality Tenderness on Palpation: Yes Treatment Notes Wound #11 (Toe Third) Wound Laterality: Right Cleanser Soap and Water Discharge Instruction: May shower and wash wound with dial antibacterial soap and water prior to dressing change. Wound Cleanser Discharge Instruction: Cleanse the wound with wound cleanser prior to applying a clean dressing using gauze sponges, not tissue or cotton balls. Peri-Wound Care Topical Primary Dressing Promogran Prisma Matrix, 4.34 (sq in) (silver collagen) Discharge Instruction: Moisten collagen with saline or hydrogel Secondary Dressing Woven Gauze Sponge, Non-Sterile 4x4 in Discharge Instruction: Apply over primary dressing as directed. Secured With 72M Medipore H Soft Cloth Surgical T ape, 4 x 10 (in/yd) Discharge Instruction: Secure with tape as directed. Compression Wrap Compression Stockings Add-Ons BRIGITTE, HOPP ([id number]) [id number] Page 10 of 17 Electronic Signature(s) Signed: 04/09/2023 3:41:08 PM By: Dayton Scrape Signed: 04/09/2023 4:06:41 PM By: Zenaida Deed RN, BSN Entered By: Dayton Scrape on 04/09/2023 15:11:39 -------------------------------------------------------------------------------- Wound Assessment Details Patient Name: Date of Service: Megan Reap. 04/09/2023 2:45 PM Medical Record Number: [id number] Patient Account Number: [id number] Date of Birth/Sex: Treating RN: 06/02/1928 (89 y.o. 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 Great oe Wound  Open Wounding Event: Gradually Appeared Status: Date Acquired: 03/17/2023 Comorbid Cataracts, Anemia, Arrhythmia, Congestive Heart Failure, Weeks Of Treatment: 3 History: Hypotension, Peripheral Venous Disease, Osteomyelitis, Clustered Wound: No Neuropathy Photos Wound Measurements Length: (cm) 0.4 Width: (cm) 0.5 Depth: (cm) 0.1 Area: (cm) 0.157 Volume: (cm) 0.016 % Reduction in Area: 68.3% % Reduction in Volume: 67.3% Epithelialization: None Tunneling: No Undermining: Yes Starting Position (o'clock): 12 Ending Position (o'clock): 12 Maximum Distance: (cm) 0.3 Wound Description Classification: Full Thickness Without Exposed Suppo Wound Margin: Flat and Intact Exudate Amount: Small Exudate Type: Purulent Exudate Color: yellow, brown, green rt Structures Foul Odor After Cleansing: No Slough/Fibrino Yes Wound Bed Granulation Amount: Small (1-33%) Exposed Structure Granulation Quality: Pink Fascia Exposed: No Necrotic Amount: Large (67-100%) Fat Layer (Subcutaneous Tissue) Exposed: Yes Necrotic Quality: Adherent Slough Tendon Exposed: No Muscle Exposed: No Joint Exposed: No Bone Exposed: No [address] Color Schlossberg, Onesha M ([id number]) [id number] Page 11 of 17 No Abnormalities Noted: Yes No Abnormalities Noted: No Erythema: No Moisture No Abnormalities Noted: Yes Temperature / Pain Temperature: No Abnormality Tenderness on Palpation: Yes Treatment Notes Wound #12 (Toe Great) Wound Laterality: Left Cleanser Soap and Water Discharge Instruction: May shower and wash wound with dial antibacterial soap and water prior to dressing change. Wound Cleanser Discharge Instruction: Cleanse the wound with wound cleanser prior to applying a clean dressing using gauze sponges, not tissue or cotton balls. Peri-Wound Care  Topical Primary Dressing Promogran Prisma Matrix, 4.34 (sq in) (silver collagen) Discharge Instruction: Moisten  collagen with saline or hydrogel Secondary Dressing Woven Gauze Sponge, Non-Sterile 4x4 in Discharge Instruction: Apply over primary dressing as directed. Secured With 51M Medipore H Soft Cloth Surgical T ape, 4 x 10 (in/yd) Discharge Instruction: Secure with tape as directed. Compression Wrap Compression Stockings Add-Ons Electronic Signature(s) Signed: 04/09/2023 3:41:08 PM By: Dayton Scrape Signed: 04/09/2023 4:06:41 PM By: Zenaida Deed RN, BSN Entered By: Dayton Scrape on 04/09/2023 15:12:28 -------------------------------------------------------------------------------- Wound Assessment Details Patient Name: Date of Service: Megan Reap. 04/09/2023 2:45 PM Medical Record Number: [id number] Patient Account Number: [id number] Date of Birth/Sex: Treating RN: 06-01-1928 (89 y.o. [name] Primary Care Marzell Isakson: [name] Other Clinician: Referring Noreene Boreman: Treating Soniyah Mcglory/Extender: Claudie Leach Weeks in Treatment: 4 Wound Status Wound Number: 13 Primary Arterial Insufficiency Ulcer Etiology: Wound Location: Left, Dorsal T Great oe Wound Open Wounding Event: Gradually Appeared Status: Date Acquired: 03/24/2023 Comorbid Cataracts, Anemia, Arrhythmia, Congestive Heart Failure, Weeks Of Treatment: 2 History: Hypotension, Peripheral Venous Disease, Osteomyelitis, Clustered Wound: No Neuropathy Photos Arnone, [name] ([id number]) [id number] Page 12 of 17 Wound Measurements Length: (cm) 0.6 Width: (cm) 1.1 Depth: (cm) 0.1 Area: (cm) 0.518 Volume: (cm) 0.052 % Reduction in Area: -555.7% % Reduction in Volume: -550% Epithelialization: None Tunneling: No Undermining: No Wound Description Classification: Full Thickness Without Exposed Support Structures Wound Margin: Distinct, outline attached Exudate Amount: Medium Exudate Type: Serosanguineous Exudate Color: red, brown Foul Odor After Cleansing: No Slough/Fibrino  Yes Wound Bed Granulation Amount: Small (1-33%) Exposed Structure Granulation Quality: Red Fat Layer (Subcutaneous Tissue) Exposed: Yes Necrotic Amount: Large (67-100%) Necrotic Quality: Eschar, Adherent Slough Periwound Skin Texture Texture Color No Abnormalities Noted: Yes No Abnormalities Noted: No Erythema: Yes Moisture Erythema Location: Circumferential No Abnormalities Noted: Yes Temperature / Pain Temperature: No Abnormality Tenderness on Palpation: Yes Treatment Notes Wound #13 (Toe Great) Wound Laterality: Dorsal, Left Cleanser Soap and Water Discharge Instruction: May shower and wash wound with dial antibacterial soap and water prior to dressing change. Wound Cleanser Discharge Instruction: Cleanse the wound with wound cleanser prior to applying a clean dressing using gauze sponges, not tissue or cotton balls. Peri-Wound Care Topical Primary Dressing Promogran Prisma Matrix, 4.34 (sq in) (silver collagen) Discharge Instruction: Moisten collagen with saline or hydrogel Secondary Dressing Woven Gauze Sponge, Non-Sterile 4x4 in Discharge Instruction: Apply over primary dressing as directed. Secured With 51M Medipore H Soft Cloth Surgical T ape, 4 x 10 (in/yd) Discharge Instruction: Secure with tape as directed. Compression Wrap Compression Stockings Add-Ons CHANTI, LESS ([id number]) [id number] Page 13 of 17 Electronic Signature(s) Signed: 04/09/2023 3:41:08 PM By: Dayton Scrape Signed: 04/09/2023 4:06:41 PM By: Zenaida Deed RN, BSN Entered By: Dayton Scrape on 04/09/2023 15:13:17 -------------------------------------------------------------------------------- Wound Assessment Details Patient Name: Date of Service: Megan Reap. 04/09/2023 2:45 PM Medical Record Number: [id number] Patient Account Number: [id number] Date of Birth/Sex: Treating RN: November 19, 1927 (89 y.o. 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 T Third oe Wound Open Wounding Event: Gradually Appeared Status: Date Acquired: 03/24/2023 Comorbid Cataracts, Anemia, Arrhythmia, Congestive Heart Failure, Weeks Of Treatment: 2 History: Hypotension, Peripheral Venous Disease, Osteomyelitis, Clustered Wound: No Neuropathy Photos Wound Measurements Length: (cm) 0.4 Width: (cm) 0.4 Depth: (cm) 0.1 Area: (cm) 0.126 Volume: (cm) 0.013 % Reduction in Area: 0% %  Reduction in Volume: 0% Epithelialization: None Tunneling: No Undermining: No Wound Description Classification: Full Thickness With Exposed Supp Wound Margin: Distinct, outline attached Exudate Amount: Medium Exudate Type: Serosanguineous Exudate Color: red, brown ort Structures Foul Odor After Cleansing: No Slough/Fibrino Yes Wound Bed Granulation Amount: Large (67-100%) Exposed Structure Granulation Quality: Red Fat Layer (Subcutaneous Tissue) Exposed: Yes Necrotic Amount: Small (1-33%) Bone Exposed: Yes Necrotic Quality: Adherent Slough Periwound Skin Texture Texture Color No Abnormalities Noted: No No Abnormalities Noted: No Scarring: Yes Hemosiderin Staining: Yes Moisture Temperature / Pain No Abnormalities Noted: No Temperature: No Abnormality Dry / Scaly: No Tenderness on Palpation: Yes Maceration: No Juncaj, Johan Judie Petit ([id number]) [id number] Page 14 of 17 Treatment Notes Wound #14 (Toe Third) Wound Laterality: Left Cleanser Soap and Water Discharge Instruction: May shower and wash wound with dial antibacterial soap and water prior to dressing change. Wound Cleanser Discharge Instruction: Cleanse the wound with wound cleanser prior to applying a clean dressing using gauze sponges, not tissue or cotton  balls. Peri-Wound Care Topical Primary Dressing Promogran Prisma Matrix, 4.34 (sq in) (silver collagen) Discharge Instruction: Moisten collagen with saline or hydrogel Secondary Dressing Woven Gauze Sponge, Non-Sterile 4x4 in Discharge Instruction: Apply over primary dressing as directed. Secured With 44M Medipore H Soft Cloth Surgical T ape, 4 x 10 (in/yd) Discharge Instruction: Secure with tape as directed. Compression Wrap Compression Stockings Add-Ons Electronic Signature(s) Signed: 04/09/2023 3:41:08 PM By: Dayton Scrape Signed: 04/09/2023 4:06:41 PM By: Zenaida Deed RN, BSN Entered By: Dayton Scrape on 04/09/2023 15:14:02 -------------------------------------------------------------------------------- Wound Assessment Details Patient Name: Date of Service: Megan Reap. 04/09/2023 2:45 PM Medical Record Number: [id number] Patient Account Number: [id number] Date of Birth/Sex: Treating RN: 02/16/1928 (89 y.o. [name] Primary Care Hellen Shanley: [name] Other Clinician: Referring Cloyce Blankenhorn: Treating Hasna Stefanik/Extender: Claudie Leach Weeks in Treatment: 4 Wound Status Wound Number: 15 Primary Arterial Insufficiency Ulcer Etiology: Wound Location: Right, Dorsal T Great oe Wound Open Wounding Event: Gradually Appeared Status: Date Acquired: 03/26/2023 Comorbid Cataracts, Anemia, Arrhythmia, Congestive Heart Failure, Weeks Of Treatment: 2 History: Hypotension, Peripheral Venous Disease, Osteomyelitis, Clustered Wound: No Neuropathy Photos Mcquaig, [name] ([id number]) [id number] Page 15 of 17 Wound Measurements Length: (cm) 0.6 Width: (cm) 0.8 Depth: (cm) 0.1 Area: (cm) 0.377 Volume: (cm) 0.038 % Reduction in Area: -431% % Reduction in Volume: -442.9% Epithelialization: Small (1-33%) Tunneling: No Undermining: No Wound Description Classification: Full Thickness Without Exposed Support Structures Wound Margin: Flat and  Intact Exudate Amount: Medium Exudate Type: Serosanguineous Exudate Color: red, brown Foul Odor After Cleansing: No Slough/Fibrino No Wound Bed Granulation Amount: Large (67-100%) Exposed Structure Granulation Quality: Red Fascia Exposed: No Necrotic Amount: Small (1-33%) Fat Layer (Subcutaneous Tissue) Exposed: Yes Necrotic Quality: Adherent Slough Tendon Exposed: No Muscle Exposed: No Joint Exposed: No Bone Exposed: No Periwound Skin Texture Texture Color No Abnormalities Noted: Yes No Abnormalities Noted: Yes Moisture Temperature / Pain No Abnormalities Noted: Yes Temperature: No Abnormality Tenderness on Palpation: Yes Treatment Notes Wound #15 (Toe Great) Wound Laterality: Dorsal, Right Cleanser Soap and Water Discharge Instruction: May shower and wash wound with dial antibacterial soap and water prior to dressing change. Wound Cleanser Discharge Instruction: Cleanse the wound with wound cleanser prior to applying a clean dressing using gauze sponges, not tissue or cotton balls. Peri-Wound Care Topical Primary Dressing Promogran Prisma Matrix, 4.34 (sq in) (silver collagen) Discharge Instruction: Moisten collagen with saline or hydrogel Secondary Dressing Woven Gauze Sponge, Non-Sterile 4x4 in Discharge Instruction: Apply over primary dressing as directed. Secured  With 19M Medipore H Soft Cloth Surgical T ape, 4 x 10 (in/yd) Discharge Instruction: Secure with tape as directed. Compression Wrap Compression Stockings Add-Ons Electronic Signature(s) Signed: 04/09/2023 3:41:08 PM By: Dayton Scrape Signed: 04/09/2023 4:06:41 PM By: Zenaida Deed RN, BSN Entered By: Dayton Scrape on 04/09/2023 15:14:44 Nazar, [name] ([id number]) [id number] Page 16 of 17 -------------------------------------------------------------------------------- Wound Assessment Details Patient Name: Date of Service: SHEVON, VARLEY 04/09/2023 2:45 PM Medical Record Number:  [id number] Patient Account Number: [id number] Date of Birth/Sex: Treating RN: 10/14/1927 (89 y.o. [name], [name] Primary Care Jeramine Delis: [name] Other Clinician: Referring Burnadette Baskett: Treating Mame Twombly/Extender: Claudie Leach Weeks in Treatment: 4 Wound Status Wound Number: 9 Primary Pressure Ulcer Etiology: Wound Location: Left Calcaneus Wound Open Wounding Event: Pressure Injury Status: Date Acquired: 10/22/2022 Comorbid Cataracts, Anemia, Arrhythmia, Congestive Heart Failure, Weeks Of Treatment: 4 History: Hypotension, Peripheral Venous Disease, Osteomyelitis, Clustered Wound: No Neuropathy Photos Wound Measurements Length: (cm) Width: (cm) Depth: (cm) Area: (cm) Volume: (cm) 0.7 % Reduction in Area: 0% 0.6 % Reduction in Volume: 66.7% 0.1 Epithelialization: Small (1-33%) 0.33 Tunneling: No 0.033 Undermining: No Wound Description Classification: Category/Stage II Wound Margin: Distinct, outline attached Exudate Amount: Small Exudate Type: Serous Exudate Color: amber Foul Odor After Cleansing: No Slough/Fibrino Yes Wound Bed Granulation Amount: Small (1-33%) Exposed Structure Granulation Quality: Pink, Pale Fascia Exposed: No Necrotic Amount: Large (67-100%) Fat Layer (Subcutaneous Tissue) Exposed: Yes Necrotic Quality: Adherent Slough Tendon Exposed: No Muscle Exposed: No Joint Exposed: No Bone Exposed: No Periwound Skin Texture Texture Color No Abnormalities Noted: Yes No Abnormalities Noted: Yes Moisture Temperature / Pain No Abnormalities Noted: Yes Temperature: No Abnormality Tenderness on Palpation: Yes Treatment Notes Wound #9 (Calcaneus) Wound Laterality: Left Cleanser Soap and Water Elem, [name] ([id number]) [id number] Page [phone] of 17 Discharge Instruction: May shower and wash wound with dial antibacterial soap and water prior to dressing change. Wound Cleanser Discharge Instruction: Cleanse the  wound with wound cleanser prior to applying a clean dressing using gauze sponges, not tissue or cotton balls. Peri-Wound Care Topical Primary Dressing Promogran Prisma Matrix, 4.34 (sq in) (silver collagen) Discharge Instruction: Moisten collagen with saline or hydrogel Secondary Dressing ALLEVYN Heel 4 1/2in x 5 1/2in / 10.5cm x 13.5cm Discharge Instruction: Apply over primary dressing as directed. Woven Gauze Sponge, Non-Sterile 4x4 in Discharge Instruction: Apply over primary dressing as directed. Secured With 19M Medipore H Soft Cloth Surgical T ape, 4 x 10 (in/yd) Discharge Instruction: Secure with tape as directed. Compression Wrap Compression Stockings Add-Ons Electronic Signature(s) Signed: 04/09/2023 4:06:41 PM By: Zenaida Deed RN, BSN Entered By: Zenaida Deed on 04/09/2023 15:12:47 -------------------------------------------------------------------------------- Vitals Details Patient Name: Date of Service: Megan Reap. 04/09/2023 2:45 PM Medical Record Number: [id number] Patient Account Number: [id number] Date of Birth/Sex: Treating RN: Feb 19, 1928 (89 y.o. [name] Primary Care Davinder Haff: [name] Other Clinician: Referring Unika Nazareno: Treating Ruairi Stutsman/Extender: Claudie Leach Weeks in Treatment: 4 Vital Signs Time Taken: 14:54 Temperature (F): 98.2 Height (in): 64 Pulse (bpm): 74 Weight (lbs): 157 Respiratory Rate (breaths/min): 18 Body Mass Index (BMI): 26.9 Blood Pressure (mmHg): 126/72 Reference Range: 80 - 120 mg / dl Electronic Signature(s) Signed: 04/09/2023 4:06:41 PM By: Zenaida Deed RN, BSN Entered By: Zenaida Deed on 04/09/2023 14:55:06

## 2023-04-16 ENCOUNTER — Ambulatory Visit (HOSPITAL_BASED_OUTPATIENT_CLINIC_OR_DEPARTMENT_OTHER): Payer: Medicare HMO | Admitting: Internal Medicine

## 2023-04-23 ENCOUNTER — Encounter (HOSPITAL_BASED_OUTPATIENT_CLINIC_OR_DEPARTMENT_OTHER): Payer: Medicare HMO | Admitting: General Surgery

## 2023-04-23 DIAGNOSIS — L97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 without evidence of necrosis: Secondary | ICD-10-CM | POA: Diagnosis not present

## 2023-04-30 ENCOUNTER — Encounter (HOSPITAL_BASED_OUTPATIENT_CLINIC_OR_DEPARTMENT_OTHER): Payer: Medicare HMO | Admitting: General Surgery

## 2023-04-30 DIAGNOSIS — L97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 without evidence of necrosis: Secondary | ICD-10-CM | POA: Diagnosis not present

## 2023-05-05 ENCOUNTER — Emergency Department (HOSPITAL_COMMUNITY): Payer: Medicare HMO

## 2023-05-05 ENCOUNTER — Inpatient Hospital Stay (HOSPITAL_COMMUNITY)
Admission: EM | Admit: 2023-05-05 | Discharge: 2023-05-12 | DRG: 603 | Disposition: A | Discharge status: Home Health | Payer: Medicare HMO | Attending: Internal Medicine | Admitting: Internal Medicine

## 2023-05-05 DIAGNOSIS — Z66 Do not resuscitate: Secondary | ICD-10-CM | POA: Diagnosis present

## 2023-05-05 DIAGNOSIS — S40021A Contusion of right upper arm, initial encounter: Secondary | ICD-10-CM | POA: Diagnosis present

## 2023-05-05 DIAGNOSIS — N1832 Chronic kidney disease, stage 3b: Secondary | ICD-10-CM | POA: Diagnosis present

## 2023-05-05 DIAGNOSIS — I350 Nonrheumatic aortic (valve) stenosis: Secondary | ICD-10-CM

## 2023-05-05 DIAGNOSIS — Z8673 Personal history of transient ischemic attack (TIA), and cerebral infarction without residual deficits: Secondary | ICD-10-CM

## 2023-05-05 DIAGNOSIS — Z86711 Personal history of pulmonary embolism: Secondary | ICD-10-CM | POA: Diagnosis present

## 2023-05-05 DIAGNOSIS — Z823 Family history of stroke: Secondary | ICD-10-CM

## 2023-05-05 DIAGNOSIS — I5032 Chronic diastolic (congestive) heart failure: Secondary | ICD-10-CM | POA: Diagnosis present

## 2023-05-05 DIAGNOSIS — G629 Polyneuropathy, unspecified: Secondary | ICD-10-CM | POA: Diagnosis present

## 2023-05-05 DIAGNOSIS — K219 Gastro-esophageal reflux disease without esophagitis: Secondary | ICD-10-CM | POA: Diagnosis present

## 2023-05-05 DIAGNOSIS — R451 Restlessness and agitation: Secondary | ICD-10-CM | POA: Diagnosis present

## 2023-05-05 DIAGNOSIS — N390 Urinary tract infection, site not specified: Secondary | ICD-10-CM | POA: Diagnosis present

## 2023-05-05 DIAGNOSIS — I2724 Chronic thromboembolic pulmonary hypertension: Secondary | ICD-10-CM | POA: Diagnosis present

## 2023-05-05 DIAGNOSIS — Z881 Allergy status to other antibiotic agents status: Secondary | ICD-10-CM

## 2023-05-05 DIAGNOSIS — D6832 Hemorrhagic disorder due to extrinsic circulating anticoagulants: Secondary | ICD-10-CM | POA: Diagnosis present

## 2023-05-05 DIAGNOSIS — L03116 Cellulitis of left lower limb: Secondary | ICD-10-CM | POA: Diagnosis not present

## 2023-05-05 DIAGNOSIS — Z79890 Hormone replacement therapy (postmenopausal): Secondary | ICD-10-CM

## 2023-05-05 DIAGNOSIS — L03115 Cellulitis of right lower limb: Principal | ICD-10-CM

## 2023-05-05 DIAGNOSIS — N952 Postmenopausal atrophic vaginitis: Secondary | ICD-10-CM | POA: Diagnosis present

## 2023-05-05 DIAGNOSIS — E222 Syndrome of inappropriate secretion of antidiuretic hormone: Secondary | ICD-10-CM | POA: Diagnosis present

## 2023-05-05 DIAGNOSIS — Z885 Allergy status to narcotic agent status: Secondary | ICD-10-CM

## 2023-05-05 DIAGNOSIS — Z90710 Acquired absence of both cervix and uterus: Secondary | ICD-10-CM

## 2023-05-05 DIAGNOSIS — I739 Peripheral vascular disease, unspecified: Secondary | ICD-10-CM | POA: Diagnosis present

## 2023-05-05 DIAGNOSIS — E871 Hypo-osmolality and hyponatremia: Secondary | ICD-10-CM | POA: Diagnosis present

## 2023-05-05 DIAGNOSIS — Z882 Allergy status to sulfonamides status: Secondary | ICD-10-CM

## 2023-05-05 DIAGNOSIS — Z7901 Long term (current) use of anticoagulants: Secondary | ICD-10-CM

## 2023-05-05 DIAGNOSIS — E039 Hypothyroidism, unspecified: Secondary | ICD-10-CM | POA: Diagnosis present

## 2023-05-05 DIAGNOSIS — Z87891 Personal history of nicotine dependence: Secondary | ICD-10-CM

## 2023-05-05 DIAGNOSIS — Z886 Allergy status to analgesic agent status: Secondary | ICD-10-CM

## 2023-05-05 DIAGNOSIS — F0390 Unspecified dementia without behavioral disturbance: Secondary | ICD-10-CM | POA: Diagnosis present

## 2023-05-05 DIAGNOSIS — T45515A Adverse effect of anticoagulants, initial encounter: Secondary | ICD-10-CM | POA: Diagnosis present

## 2023-05-05 DIAGNOSIS — Z91041 Radiographic dye allergy status: Secondary | ICD-10-CM

## 2023-05-05 DIAGNOSIS — Z89421 Acquired absence of other right toe(s): Secondary | ICD-10-CM

## 2023-05-05 DIAGNOSIS — Z79899 Other long term (current) drug therapy: Secondary | ICD-10-CM

## 2023-05-05 DIAGNOSIS — I130 Hypertensive heart and chronic kidney disease with heart failure and stage 1 through stage 4 chronic kidney disease, or unspecified chronic kidney disease: Secondary | ICD-10-CM | POA: Diagnosis present

## 2023-05-05 DIAGNOSIS — Z880 Allergy status to penicillin: Secondary | ICD-10-CM

## 2023-05-05 DIAGNOSIS — Z7982 Long term (current) use of aspirin: Secondary | ICD-10-CM

## 2023-05-05 DIAGNOSIS — I4821 Permanent atrial fibrillation: Secondary | ICD-10-CM | POA: Diagnosis present

## 2023-05-05 DIAGNOSIS — R5381 Other malaise: Secondary | ICD-10-CM

## 2023-05-05 DIAGNOSIS — D62 Acute posthemorrhagic anemia: Secondary | ICD-10-CM

## 2023-05-05 LAB — COMPREHENSIVE METABOLIC PANEL
ALT: 15 U/L (ref 0–44)
AST: 29 U/L (ref 15–41)
Albumin: 2.8 g/dL — ABNORMAL LOW (ref 3.5–5.0)
Alkaline Phosphatase: 122 U/L (ref 38–126)
Anion gap: 10 (ref 5–15)
BUN: 29 mg/dL — ABNORMAL HIGH (ref 8–23)
CO2: 27 mmol/L (ref 22–32)
Calcium: 8.5 mg/dL — ABNORMAL LOW (ref 8.9–10.3)
Chloride: 88 mmol/L — ABNORMAL LOW (ref 98–111)
Creatinine, Ser: 1.29 mg/dL — ABNORMAL HIGH (ref 0.44–1.00)
GFR, Estimated: 39 mL/min — ABNORMAL LOW (ref >60)
Glucose, Bld: 93 mg/dL (ref 70–99)
Potassium: 3.6 mmol/L (ref 3.5–5.1)
Sodium: 125 mmol/L — ABNORMAL LOW (ref 135–145)
Total Bilirubin: 0.8 mg/dL (ref 0.3–1.2)
Total Protein: 6 g/dL — ABNORMAL LOW (ref 6.5–8.1)

## 2023-05-05 LAB — CBC WITH DIFFERENTIAL/PLATELET
Abs Immature Granulocytes: 0.04 10*3/uL (ref 0.00–0.07)
Basophils Absolute: 0.1 10*3/uL (ref 0.0–0.1)
Basophils Relative: 1 %
Eosinophils Absolute: 0.4 10*3/uL (ref 0.0–0.5)
Eosinophils Relative: 4 %
HCT: 25.3 % — ABNORMAL LOW (ref 36.0–46.0)
Hemoglobin: 8.2 g/dL — ABNORMAL LOW (ref 12.0–15.0)
Immature Granulocytes: 1 %
Lymphocytes Relative: 19 %
Lymphs Abs: 1.5 10*3/uL (ref 0.7–4.0)
MCH: 30.9 pg (ref 26.0–34.0)
MCHC: 32.4 g/dL (ref 30.0–36.0)
MCV: 95.5 fL (ref 80.0–100.0)
Monocytes Absolute: 0.8 10*3/uL (ref 0.1–1.0)
Monocytes Relative: 10 %
Neutro Abs: 5.4 10*3/uL (ref 1.7–7.7)
Neutrophils Relative %: 65 %
Platelets: 205 10*3/uL (ref 150–400)
RBC: 2.65 MIL/uL — ABNORMAL LOW (ref 3.87–5.11)
RDW: 14.5 % (ref 11.5–15.5)
WBC: 8.1 10*3/uL (ref 4.0–10.5)
nRBC: 0 % (ref 0.0–0.2)

## 2023-05-05 MED ORDER — SODIUM CHLORIDE 0.9 % IV SOLN
2.0000 g | Freq: Once | INTRAVENOUS | Status: AC
  Administered 2023-05-05: 2 g via INTRAVENOUS
  Filled 2023-05-05: qty 12.5

## 2023-05-05 MED ORDER — LACTATED RINGERS IV BOLUS
1000.0000 mL | Freq: Once | INTRAVENOUS | Status: AC
  Administered 2023-05-05: 1000 mL via INTRAVENOUS

## 2023-05-05 MED ORDER — VANCOMYCIN HCL IN DEXTROSE 1-5 GM/200ML-% IV SOLN
1000.0000 mg | Freq: Once | INTRAVENOUS | Status: AC
  Administered 2023-05-06: 1000 mg via INTRAVENOUS
  Filled 2023-05-05: qty 200

## 2023-05-05 NOTE — Progress Notes (Signed)
GAYNEL, JANAS ([id number]) [id number] Page 1 of 15 Visit Report for 04/23/2023 Arrival Information Details Patient Name: Date of Service: [name]. 04/23/2023 2:30 PM Medical Record Number: [id number] Patient Account Number: [id number] Date of Birth/Sex: Treating RN: Jan 26, 1928 (89 y.o. [name] Primary Care [name]: [name] Other Clinician: Referring [name]: Treating [name]/Extender: [name] in Treatment: 6 Visit Information History Since Last Visit All ordered tests and consults were completed: Yes Patient Arrived: Wheel Chair Added or deleted any medications: No Arrival Time: 15:13 Any new allergies or adverse reactions: No Accompanied By: daughter Had a fall or experienced change in No Transfer Assistance: EasyPivot Patient Lift activities of daily living that may affect Patient Identification Verified: Yes risk of falls: Secondary Verification Process Completed: Yes Signs or symptoms of abuse/neglect since last visito No Patient Requires Transmission-Based Precautions: No Hospitalized since last visit: No Patient Has Alerts: Yes Implantable device outside of the clinic excluding No Patient Alerts: ABI R: 0.48 L: 0.46 2/24 cellular tissue based products placed in the center TBI R: 0.39 L: 0.21 2/24 since last visit: Has Dressing in Place as Prescribed: Yes Pain Present Now: No Electronic Signature(s) Signed: 05/05/2023 2:03:55 PM By: [name] Entered By: [name] on 04/23/2023 15:17:21 -------------------------------------------------------------------------------- Encounter Discharge Information Details Patient Name: Date of Service: Megan Reap. 04/23/2023 2:30 PM Medical Record Number: [id number] Patient Account Number: [id number] Date of Birth/Sex: Treating RN: 11-May-1928 (89 y.o. [name] Primary Care [name]: [name] Other Clinician: Referring  [name]: Treating [name]/Extender: [name] in Treatment: 6 Encounter Discharge Information Items Post Procedure Vitals Discharge Condition: Stable Temperature (F): 98.6 Ambulatory Status: Wheelchair Pulse (bpm): 80 Discharge Destination: Home Respiratory Rate (breaths/min): 22 Transportation: Private Auto Blood Pressure (mmHg): 138/65 Accompanied By: daughter Schedule Follow-up Appointment: Yes Clinical Summary of Care: Patient Declined Electronic Signature(s) Signed: 05/05/2023 2:03:55 PM By: [name] Entered By: [name] on 04/23/2023 16:23:52 [name] ([id number]) [id number] Page 2 of 15 -------------------------------------------------------------------------------- Lower Extremity Assessment Details Patient Name: Date of Service: [name] 04/23/2023 2:30 PM Medical Record Number: [id number] Patient Account Number: [id number] Date of Birth/Sex: Treating RN: 1928/04/06 (89 y.o. [name] Primary Care Rex Magee: [name] Other Clinician: Referring Buddie Marston: Treating [name]/Extender: [name] in Treatment: 6 Edema Assessment Assessed: [Left: No] [Right: No] Edema: [Left: Yes] [Right: Yes] Calf Left: Right: Point of Measurement: From Medial Instep 35.5 cm 37 cm Ankle Left: Right: Point of Measurement: From Medial Instep 21.5 cm 20.5 cm Vascular Assessment Pulses: Dorsalis Pedis Palpable: [Left:Yes] [Right:Yes] Extremity colors, hair growth, and conditions: Extremity Color: [Left:Hyperpigmented] [Right:Hyperpigmented] Hair Growth on Extremity: [Left:No] [Right:No] Temperature of Extremity: [Left:Warm < 3 seconds] [Right:Warm < 3 seconds] Toe Nail Assessment Left: Right: Thick: Yes Yes Discolored: Yes Yes Deformed: Yes Yes Improper Length and Hygiene: Yes Yes Electronic Signature(s) Signed: 05/05/2023 2:03:55 PM By: [name] Entered By:  [name] on 04/23/2023 15:26:39 -------------------------------------------------------------------------------- Multi Wound Chart Details Patient Name: Date of Service: Megan Reap. 04/23/2023 2:30 PM Medical Record Number: [id number] Patient Account Number: [id number] Date of Birth/Sex: Treating RN: September 17, 1927 (89 y.o. F) Primary Care Abdulhamid Olgin: [name] Other Clinician: Referring Mackenna Kamer: Treating [name]/Extender: [name] in Treatment: 6 Vital Signs Height(in): 64 Pulse(bpm): 74 Weight(lbs): 157 Blood Pressure(mmHg): 185/71 Body Mass Index(BMI): 26.9 Temperature(F): 98.2 Respiratory Rate(breaths/min): 18 [name] ([id number]) [id number] Page 3 of 15 [10:Photos:] Left T Second oe Right T Third  oe Left T Great oe Wound Location: Pressure Injury Pressure Injury Gradually Appeared Wounding Event: Pressure Ulcer Pressure Ulcer Arterial Insufficiency Ulcer Primary Etiology: Cataracts, Anemia, Arrhythmia, Cataracts, Anemia, Arrhythmia, Cataracts, Anemia, Arrhythmia, Comorbid History: Congestive Heart Failure, Congestive Heart Failure, Congestive Heart Failure, Hypotension, Peripheral Venous Hypotension, Peripheral Venous Hypotension, Peripheral Venous Disease, Osteomyelitis, Neuropathy Disease, Osteomyelitis, Neuropathy Disease, Osteomyelitis, Neuropathy 10/09/2022 10/09/2022 03/17/2023 Date Acquired: 6 6 5  [name] of Treatment: Open Open Open Wound Status: No No No Wound Recurrence: 0.5x0.7x0.1 0.8x0.8x0.1 0.4x0.5x0.1 Measurements L x W x D (cm) 0.275 0.503 0.157 A (cm) : rea 0.027 0.05 0.016 Volume (cm) : 61.10% 28.90% 68.30% % Reduction in A rea: 62.00% 29.60% 67.30% % Reduction in Volume: Category/Stage IV Category/Stage IV Full Thickness Without Exposed Classification: Support Structures Small Medium Small Exudate A mount: Serosanguineous Serosanguineous Purulent Exudate Type: red, brown  red, brown yellow, brown, green Exudate Color: Distinct, outline attached Distinct, outline attached Flat and Intact Wound Margin: Large (67-100%) Large (67-100%) Small (1-33%) Granulation A mount: Red Red Pink Granulation Quality: Small (1-33%) Small (1-33%) Large (67-100%) Necrotic A mount: Adherent Slough Adherent [employer] Necrotic Tissue: Fat Layer (Subcutaneous Tissue): Yes Fat Layer (Subcutaneous Tissue): Yes Fat Layer (Subcutaneous Tissue): Yes Exposed Structures: Joint: Yes Joint: Yes Fascia: No Bone: Yes Bone: Yes Tendon: No Fascia: No Fascia: No Muscle: No Tendon: No Tendon: No Joint: No Muscle: No Muscle: No Bone: No Small (1-33%) None None Epithelialization: Debridement - Selective/Open Wound Debridement - Selective/Open Wound Debridement - Selective/Open Wound Debridement: Pre-procedure Verification/Time Out 15:55 15:55 15:55 Taken: Lidocaine 4% Topical Solution Lidocaine 4% Topical Solution Lidocaine 4% Topical Solution Pain Control: Necrotic/Eschar, Ambulance person, Ambulance person, [employer] Tissue Debrided: Non-Viable Tissue Non-Viable Tissue Non-Viable Tissue Level: 0.27 0.5 0.16 Debridement A (sq cm): rea Curette Curette Curette Instrument: Minimum Minimum Minimum Bleeding: Pressure Pressure Pressure Hemostasis A chieved: 0 0 0 Procedural Pain: 0 0 0 Post Procedural Pain: Procedure was tolerated well Procedure was tolerated well Procedure was tolerated well Debridement Treatment Response: 0.5x0.7x0.1 0.8x0.8x0.1 0.4x0.5x0.1 Post Debridement Measurements L x W x D (cm) 0.027 0.05 0.016 Post Debridement Volume: (cm) Category/Stage IV Category/Stage IV N/A Post Debridement Stage: No Abnormalities Noted No Abnormalities Noted No Abnormalities Noted Periwound Skin Texture: Dry/Scaly: Yes Dry/Scaly: Yes No Abnormalities Noted Periwound Skin Moisture: No Abnormalities Noted Rubor: No Erythema: No Periwound Skin  Color: N/A N/A N/A Erythema Location: No Abnormality No Abnormality No Abnormality Temperature: N/A Yes Yes Tenderness on Palpation: Debridement Debridement Debridement Procedures Performed: Wound Number: [id number] 15  Photos: Left, Dorsal T Great oe Left T Third oe Right, Dorsal T Great oe Wound Location: Gradually Appeared Gradually Appeared Gradually Appeared Wounding Event: ALAIYAH, NEDROW ([id number]) [id number] Page 4 of 15 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 Etiology: Cataracts, Anemia, Arrhythmia, Cataracts, Anemia, Arrhythmia, Cataracts, Anemia, Arrhythmia, Comorbid History: Congestive Heart Failure, Congestive Heart Failure, Congestive Heart Failure, Hypotension, Peripheral Venous Hypotension, Peripheral Venous Hypotension, Peripheral Venous Disease, Osteomyelitis, Neuropathy Disease, Osteomyelitis, Neuropathy Disease, Osteomyelitis, Neuropathy 03/24/2023 03/24/2023 03/26/2023 Date Acquired: 4 4 4  [name] of Treatment: Open Open Open Wound Status: No No No Wound Recurrence: 0.6x1x0.1 0.4x0.4x0.1 0.5x0.6x0.1 Measurements L x W x D (cm) 0.471 0.126 0.236 A (cm) : rea 0.047 0.013 0.024 Volume (cm) : -496.20% 0.00% -232.40% % Reduction in A rea: -487.50% 0.00% -242.90% % Reduction in Volume: Full Thickness Without Exposed Full Thickness With Exposed Support Full Thickness Without Exposed Classification: Support Structures Structures Support Structures Medium Medium Medium Exudate A mount: Serosanguineous  Serosanguineous Serosanguineous Exudate Type: red, brown red, brown red, brown Exudate Color: Distinct, outline attached Distinct, outline attached Flat and Intact Wound Margin: Small (1-33%) Large (67-100%) Large (67-100%) Granulation A mount: Red Red Red Granulation Quality: Large (67-100%) Small (1-33%) Small (1-33%) Necrotic A mount: Eschar, Adherent Slough Adherent [employer] Necrotic Tissue: Fat Layer (Subcutaneous Tissue): Yes Fat Layer (Subcutaneous Tissue): Yes Fat Layer (Subcutaneous Tissue): Yes Exposed Structures: Bone: Yes Fascia: No Tendon: No Muscle: No Joint: No Bone: No None None Small (1-33%) Epithelialization: Debridement - Selective/Open Wound Debridement - Selective/Open Wound Debridement - Selective/Open Wound Debridement: Pre-procedure Verification/Time Out 15:55 15:55 15:55 Taken: Lidocaine 4% Topical Solution Lidocaine 4% Topical Solution Lidocaine 4% Topical Solution Pain Control: Necrotic/Eschar, Ambulance person, Ambulance person, [employer] Tissue Debrided: Non-Viable Tissue Non-Viable Tissue Non-Viable Tissue Level: 0.47 0.13 0.24 Debridement A (sq cm): rea Curette Curette Curette Instrument: Minimum Minimum Minimum Bleeding: Pressure Pressure Pressure Hemostasis A chieved: 0 0 0 Procedural Pain: 0 0 0 Post Procedural Pain: Procedure was tolerated well Procedure was tolerated well Procedure was tolerated well Debridement Treatment Response: 0.6x1x0.1 0.4x0.4x0.1 0.5x0.6x0.1 Post Debridement Measurements L x W x D (cm) 0.047 0.013 0.024 Post Debridement Volume: (cm) N/A N/A N/A Post Debridement Stage: No Abnormalities Noted Scarring: Yes No Abnormalities Noted Periwound Skin Texture: No Abnormalities Noted Maceration: No No Abnormalities Noted Periwound Skin Moisture: Dry/Scaly: No Erythema: Yes Hemosiderin Staining: Yes No Abnormalities Noted Periwound Skin Color: Circumferential N/A N/A Erythema Location: No Abnormality No Abnormality No Abnormality Temperature: Yes Yes Yes Tenderness on Palpation: Debridement Debridement Debridement Procedures Performed: Wound Number: 9 N/A N/A Photos: N/A N/A Left Calcaneus N/A N/A Wound Location: Pressure Injury N/A N/A Wounding Event: Pressure Ulcer N/A N/A Primary Etiology: Cataracts, Anemia, Arrhythmia, N/A N/A Comorbid  History: Congestive Heart Failure, Hypotension, Peripheral Venous Disease, Osteomyelitis, Neuropathy 10/22/2022 N/A N/A Date Acquired: 6 N/A N/A [name] of Treatment: Open N/A N/A Wound Status: No N/A N/A Wound Recurrence: 0.7x0.6x0.1 N/A N/A Measurements L x W x D (cm) 0.33 N/A N/A A (cm) : rea 0.033 N/A N/A Volume (cm) : 0.00% N/A N/A % Reduction in A rea: 66.70% N/A N/A % Reduction in Volume: Category/Stage II N/A N/A Classification: Small N/A N/A Exudate A mountFALENA, WINTERFELD ([id number]) [id number] Page 5 of 15 Serous N/A N/A Exudate Type: amber N/A N/A Exudate Color: Distinct, outline attached N/A N/A Wound Margin: Small (1-33%) N/A N/A Granulation Amount: Pink, Pale N/A N/A Granulation Quality: Large (67-100%) N/A N/A Necrotic Amount: Adherent Slough N/A N/A Necrotic Tissue: Fat Layer (Subcutaneous Tissue): Yes N/A N/A Exposed Structures: Fascia: No Tendon: No Muscle: No Joint: No Bone: No Small (1-33%) N/A N/A Epithelialization: Debridement - Excisional N/A N/A Debridement: Pre-procedure Verification/Time Out 15:55 N/A N/A Taken: Lidocaine 4% Topical Solution N/A N/A Pain Control: Necrotic/Eschar, Subcutaneous, N/A N/A Tissue Debrided: Slough Skin/Subcutaneous Tissue N/A N/A Level: 0.33 N/A N/A Debridement A (sq cm): rea Curette N/A N/A Instrument: Minimum N/A N/A Bleeding: Pressure N/A N/A Hemostasis Achieved: 0 N/A N/A Procedural Pain: 0 N/A N/A Post Procedural Pain: Debridement Treatment Response: Procedure was tolerated well N/A N/A Post Debridement Measurements L x 0.7x0.6x0.1 N/A N/A W x D (cm) 0.033 N/A N/A Post Debridement Volume: (cm) Category/Stage II N/A N/A Post Debridement Stage: Callus: No N/A N/A Periwound Skin Texture: Dry/Scaly: Yes N/A N/A Periwound Skin Moisture: No Abnormalities Noted N/A N/A Periwound Skin Color: N/A N/A N/A Erythema Location: No Abnormality N/A  N/A Temperature: Yes N/A N/A Tenderness on Palpation: Debridement N/A N/A Procedures  Performed: Treatment Notes Electronic Signature(s) Signed: 04/23/2023 4:06:06 PM By: Duanne Guess MD FACS Previous Signature: 04/23/2023 3:50:40 PM Version By: Duanne Guess MD FACS Entered By: Duanne Guess on 04/23/2023 16:06:05 -------------------------------------------------------------------------------- Multi-Disciplinary Care Plan Details Patient Name: Date of Service: Megan Reap. 04/23/2023 2:30 PM Medical Record Number: [id number] Patient Account Number: [id number] Date of Birth/Sex: Treating RN: 02-24-1928 (89 y.o. [name] Primary Care Brantley Naser: [name] Other Clinician: Referring Shantrell Placzek: Treating Anaalicia Reimann/Extender: Tiajuana Amass in Treatment: 6 Multidisciplinary Care Plan reviewed with physician Active Inactive Abuse / Safety / Falls / Self Care Management Nursing Diagnoses: Impaired home maintenance Impaired physical mobility Potential for falls Goals: Patient/caregiver will verbalize/demonstrate measure taken to improve self care Date Initiated: 03/10/2023 Target Resolution Date: 05/15/2023 MATISON, STONEBACK ([id number]) [phone].pdf Page 6 of 15 Goal Status: Active Patient/caregiver will verbalize/demonstrate measures taken to improve the patient's personal safety Date Initiated: 03/10/2023 Target Resolution Date: 05/15/2023 Goal Status: Active Interventions: Assess fall risk on admission and as needed Assess: immobility, friction, shearing, incontinence upon admission and as needed Provide education on fall prevention Notes: Wound/Skin Impairment Nursing Diagnoses: Impaired tissue integrity Knowledge deficit related to ulceration/compromised skin integrity Goals: Patient/caregiver will verbalize understanding of skin care regimen Date Initiated: 03/10/2023 Target Resolution Date: 05/15/2023 Goal Status:  Active Interventions: Assess patient/caregiver ability to obtain necessary supplies Assess patient/caregiver ability to perform ulcer/skin care regimen upon admission and as needed Assess ulceration(s) every visit Treatment Activities: Skin care regimen initiated : 03/10/2023 Topical wound management initiated : 03/10/2023 Notes: Electronic Signature(s) Signed: 05/05/2023 2:03:55 PM By: [name] Entered By: [name] on 04/23/2023 15:47:12 -------------------------------------------------------------------------------- Pain Assessment Details Patient Name: Date of Service: Megan Reap. 04/23/2023 2:30 PM Medical Record Number: [id number] Patient Account Number: [id number] Date of Birth/Sex: Treating RN: 1928-09-05 (89 y.o. [name] Primary Care Mitesh Rosendahl: [name] Other Clinician: Referring Denyla Cortese: Treating Marguriete Wootan/Extender: [name] in Treatment: 6 Active Problems Location of Pain Severity and Description of Pain Patient Has Paino No Site Locations [place], [place] ([id number]) [id number] Page 7 of 15 Pain Management and Medication Current Pain Management: Electronic Signature(s) Signed: 05/05/2023 2:03:55 PM By: [name] Entered By: [name] on 04/23/2023 15:18:12 -------------------------------------------------------------------------------- Patient/Caregiver Education Details Patient Name: Date of Service: Megan Reap 8/15/2024andnbsp2:30 PM Medical Record Number: [id number] Patient Account Number: [id number] Date of Birth/Gender: Treating RN: January 01, 1928 (89 y.o. [name] Primary Care Physician: [name] Other Clinician: Referring Physician: Treating Physician/Extender: Tiajuana Amass in Treatment: 6 Education Assessment Education Provided To: Patient and Caregiver Education Topics Provided Wound/Skin Impairment: Methods:  Explain/Verbal Responses: State content correctly Electronic Signature(s) Signed: 05/05/2023 2:03:55 PM By: [name] Entered By: [name] on 04/23/2023 15:47:34 -------------------------------------------------------------------------------- Wound Assessment Details Patient Name: Date of Service: Megan Reap. 04/23/2023 2:30 PM Medical Record Number: [id number] Patient Account Number: [id number] Date of Birth/Sex: Treating RN: 1928-08-14 (89 y.o. [name] Primary Care Ajdin Macke: [name] Other Clinician: Referring Axxel Gude: Treating Pritika Alvarez/Extender: Megan Leach [place], [place] Judie Petit ([id number]) [id number] Page 8 of 15 [name] in Treatment: 6 Wound Status Wound Number: 10 Primary Pressure Ulcer Etiology: Wound Location: Left T Second oe Wound Open Wounding Event: Pressure Injury Status: Date Acquired: 10/09/2022 Comorbid Cataracts, Anemia, Arrhythmia, Congestive Heart Failure, [name] Of Treatment: 6 History: Hypotension, Peripheral Venous Disease, Osteomyelitis, Clustered Wound: No Neuropathy Photos Wound Measurements Length: (cm) 0.5 Width: (cm) 0.7 Depth: (cm) 0.1 Area: (cm) 0.275 Volume: (cm) 0.027 % Reduction  in Area: 61.1% % Reduction in Volume: 62% Epithelialization: Small (1-33%) Tunneling: No Undermining: No Wound Description Classification: Category/Stage IV Wound Margin: Distinct, outline attached Exudate Amount: Small Exudate Type: Serosanguineous Exudate Color: red, brown Foul Odor After Cleansing: No Slough/Fibrino No Wound Bed Granulation Amount: Large (67-100%) Exposed Structure Granulation Quality: Red Fascia Exposed: No Necrotic Amount: Small (1-33%) Fat Layer (Subcutaneous Tissue) Exposed: Yes Necrotic Quality: Adherent Slough Tendon Exposed: No Muscle Exposed: No Joint Exposed: Yes Bone Exposed: Yes Periwound Skin Texture Texture Color No Abnormalities Noted: Yes No  Abnormalities Noted: Yes Moisture Temperature / Pain No Abnormalities Noted: Yes Temperature: No Abnormality Electronic Signature(s) Signed: 05/05/2023 2:03:55 PM By: [name] Entered By: [name] on 04/23/2023 15:39:40 -------------------------------------------------------------------------------- Wound Assessment Details Patient Name: Date of Service: Megan Reap. 04/23/2023 2:30 PM Medical Record Number: [id number] Patient Account Number: [id number] Date of Birth/Sex: Treating RN: [birth year] (89 y.o. [name] Primary Care Annlouise Gerety: [name] Other Clinician: Referring Shadow Stiggers: Treating Trenda Corliss/Extender: Megan Leach [place], [place] Judie Petit ([id number]) [id number] Page 9 of 15 [name] in Treatment: 6 Wound Status Wound Number: 11 Primary Pressure Ulcer Etiology: Wound Location: Right T Third oe Wound Open Wounding Event: Pressure Injury Status: Date Acquired: 10/09/2022 Comorbid Cataracts, Anemia, Arrhythmia, Congestive Heart Failure, [name] Of Treatment: 6 History: Hypotension, Peripheral Venous Disease, Osteomyelitis, Clustered Wound: No Neuropathy Photos Wound Measurements Length: (cm) 0.8 Width: (cm) 0.8 Depth: (cm) 0.1 Area: (cm) 0.503 Volume: (cm) 0.05 % Reduction in Area: 28.9% % Reduction in Volume: 29.6% Epithelialization: None Tunneling: No Undermining: No Wound Description Classification: Category/Stage IV Wound Margin: Distinct, outline attached Exudate Amount: Medium Exudate Type: Serosanguineous Exudate Color: red, brown Foul Odor After Cleansing: No Slough/Fibrino Yes Wound Bed Granulation Amount: Large (67-100%) Exposed Structure Granulation Quality: Red Fascia Exposed: No Necrotic Amount: Small (1-33%) Fat Layer (Subcutaneous Tissue) Exposed: Yes Necrotic Quality: Adherent Slough Tendon Exposed: No Muscle Exposed: No Joint Exposed: Yes Bone Exposed: Yes Periwound Skin  Texture Texture Color No Abnormalities Noted: Yes No Abnormalities Noted: Yes Moisture Temperature / Pain No Abnormalities Noted: Yes Temperature: No Abnormality Tenderness on Palpation: Yes Electronic Signature(s) Signed: 05/05/2023 2:03:55 PM By: [name] Entered By: [name] on 04/23/2023 15:41:39 -------------------------------------------------------------------------------- Wound Assessment Details Patient Name: Date of Service: Megan Reap. 04/23/2023 2:30 PM Medical Record Number: [id number] Patient Account Number: [id number] Date of Birth/Sex: Treating RN: 18-Nov-1927 (89 y.o. 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 Great oe Wound Open Wounding Event: Gradually Appeared Status: Date Acquired: 03/17/2023 Comorbid Cataracts, Anemia, Arrhythmia, Congestive Heart Failure, [name] Of Treatment: 5 History: Hypotension, Peripheral Venous Disease, Osteomyelitis, Clustered Wound: No Neuropathy Photos Wound Measurements Length: (cm) 0.4 Width: (cm) 0.5 Depth: (cm) 0.1 Area: (cm) 0.157 Volume: (cm) 0.016 % Reduction in Area: 68.3% % Reduction in Volume: 67.3% Epithelialization: None Tunneling: No Undermining: No Wound Description Classification: Full Thickness Without Exposed Suppo Wound Margin: Flat and Intact Exudate Amount: Small Exudate Type: Purulent Exudate Color: yellow, brown, green rt Structures Foul Odor After Cleansing: No Slough/Fibrino Yes Wound Bed Granulation Amount: Small (1-33%) Exposed Structure Granulation Quality: Pink Fascia Exposed: No Necrotic Amount: Large (67-100%) Fat Layer (Subcutaneous Tissue) Exposed: Yes Necrotic  Quality: Adherent Slough Tendon Exposed: No Muscle Exposed: No Joint Exposed: No Bone Exposed: No Periwound Skin Texture Texture  Color No Abnormalities Noted: Yes No Abnormalities Noted: No Erythema: No Moisture No Abnormalities Noted: Yes Temperature / Pain Temperature: No Abnormality Tenderness on Palpation: Yes Electronic Signature(s) Signed: 05/05/2023 2:03:55 PM By: [name] Entered By: [name] on 04/23/2023 15:42:24 -------------------------------------------------------------------------------- Wound Assessment Details Patient Name: Date of Service: Megan Reap. 04/23/2023 2:30 PM Medical Record Number: [id number] Patient Account Number: [id number] MYKAEL, HOUZE ([id number]) [phone].pdf Page 11 of 15 Date of Birth/Sex: Treating RN: November 17, 1927 (89 y.o. [name] Primary Care Wynonia Medero: Other Clinician: Jorge [name] Referring Kenni Newton: Treating Rashika Bettes/Extender: [name] in Treatment: 6 Wound Status Wound Number: 13 Primary Arterial Insufficiency Ulcer Etiology: Wound Location: Left, Dorsal T Great oe Wound Open Wounding Event: Gradually Appeared Status: Date Acquired: 03/24/2023 Comorbid Cataracts, Anemia, Arrhythmia, Congestive Heart Failure, [name] Of Treatment: 4 History: Hypotension, Peripheral Venous Disease, Osteomyelitis, Clustered Wound: No Neuropathy Photos Wound Measurements Length: (cm) 0.6 Width: (cm) 1 Depth: (cm) 0.1 Area: (cm) 0.471 Volume: (cm) 0.047 % Reduction in Area: -496.2% % Reduction in Volume: -487.5% Epithelialization: None Tunneling: No Undermining: No Wound Description Classification: Full Thickness Without Exposed Suppo Wound Margin: Distinct, outline attached Exudate Amount: Medium Exudate Type: Serosanguineous Exudate Color: red, brown rt Structures Foul Odor After Cleansing: No Slough/Fibrino Yes Wound Bed Granulation Amount: Small (1-33%)  Exposed Structure Granulation Quality: Red Fat Layer (Subcutaneous Tissue) Exposed: Yes Necrotic Amount: Large (67-100%) Necrotic Quality: Eschar, Adherent Slough Periwound Skin Texture Texture Color No Abnormalities Noted: Yes No Abnormalities Noted: No Erythema: Yes Moisture Erythema Location: Circumferential No Abnormalities Noted: Yes Temperature / Pain Temperature: No Abnormality Tenderness on Palpation: Yes Electronic Signature(s) Signed: 05/05/2023 2:03:55 PM By: [name] Entered By: [name] on 04/23/2023 15:43:21 -------------------------------------------------------------------------------- Wound Assessment Details Patient Name: Date of Service: Megan Reap. 04/23/2023 2:30 PM Medical Record Number: [id number] Patient Account Number: [id number] SUHAYLAH, SHELDON ([id number]) [phone].pdf Page 12 of 15 Date of Birth/Sex: Treating RN: [birth year] (89 y.o. 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 T Third oe Wound Open Wounding Event: Gradually Appeared Status: Date Acquired: 03/24/2023 Comorbid Cataracts, Anemia, Arrhythmia, Congestive Heart Failure, [name] Of Treatment: 4 History: Hypotension, Peripheral Venous Disease, Osteomyelitis, Clustered Wound: No Neuropathy Photos Wound Measurements Length: (cm) 0.4 Width: (cm) 0.4 Depth: (cm) 0.1 Area: (cm) 0.126 Volume: (cm) 0.013 % Reduction in Area: 0% % Reduction in Volume: 0% Epithelialization: None Tunneling: No Undermining: No Wound Description Classification: Full Thickness With Exposed Supp Wound Margin: Distinct, outline attached Exudate Amount: Medium Exudate Type: Serosanguineous Exudate Color: red, brown ort Structures Foul Odor After  Cleansing: No Slough/Fibrino Yes Wound Bed Granulation Amount: Large (67-100%) Exposed Structure Granulation Quality: Red Fat Layer (Subcutaneous Tissue) Exposed: Yes Necrotic Amount: Small (1-33%) Bone Exposed: Yes Necrotic Quality: Adherent Slough Periwound Skin Texture Texture Color No Abnormalities Noted: No No Abnormalities Noted: No Scarring: Yes Hemosiderin Staining: Yes Moisture Temperature / Pain No Abnormalities Noted: No Temperature: No Abnormality Dry / Scaly: No Tenderness on Palpation: Yes Maceration: No Electronic Signature(s) Signed: 05/05/2023 2:03:55 PM By: [name] Entered By: [name] on 04/23/2023 15:44:02 -------------------------------------------------------------------------------- Wound Assessment Details Patient Name: Date of Service: Megan Reap. 04/23/2023 2:30 PM Medical Record Number: [id number] Patient Account Number: [id number] QUIARA, KORZENIEWSKI ([id number]) [phone].pdf Page 13 of 15 Date of Birth/Sex: Treating RN: 20-Dec-1927 (89 y.o. [name] Primary  Care Malakye Nolden: Other Clinician: Jorge [name] Referring Neil Errickson: Treating Arryana Tolleson/Extender: [name] in Treatment: 6 Wound Status Wound Number: 15 Primary Arterial Insufficiency Ulcer Etiology: Wound Location: Right, Dorsal T Great oe Wound Open Wounding Event: Gradually Appeared Status: Date Acquired: 03/26/2023 Comorbid Cataracts, Anemia, Arrhythmia, Congestive Heart Failure, [name] Of Treatment: 4 History: Hypotension, Peripheral Venous Disease, Osteomyelitis, Clustered Wound: No Neuropathy Photos Wound Measurements Length: (cm) 0.5 Width: (cm) 0.6 Depth: (cm) 0.1 Area: (cm) 0.236 Volume: (cm) 0.024 % Reduction in Area: -232.4% % Reduction in Volume: -242.9% Epithelialization: Small (1-33%) Tunneling: No Undermining: No Wound Description Classification: Full Thickness Without Exposed Suppo Wound  Margin: Flat and Intact Exudate Amount: Medium Exudate Type: Serosanguineous Exudate Color: red, brown rt Structures Foul Odor After Cleansing: No Slough/Fibrino No Wound Bed Granulation Amount: Large (67-100%) Exposed Structure Granulation Quality: Red Fascia Exposed: No Necrotic Amount: Small (1-33%) Fat Layer (Subcutaneous Tissue) Exposed: Yes Necrotic Quality: Adherent Slough Tendon Exposed: No Muscle Exposed: No Joint Exposed: No Bone Exposed: No Periwound Skin Texture Texture Color No Abnormalities Noted: Yes No Abnormalities Noted: Yes Moisture Temperature / Pain No Abnormalities Noted: Yes Temperature: No Abnormality Tenderness on Palpation: Yes Electronic Signature(s) Signed: 05/05/2023 2:03:55 PM By: [name] Entered By: [name] on 04/23/2023 15:44:55 -------------------------------------------------------------------------------- Wound Assessment Details Patient Name: Date of Service: Megan Reap. 04/23/2023 2:30 PM Esperanza, Ginette [name] ([id number]) [id number] Page 14 of 15 Medical Record Number: [id number] Patient Account Number: [id number] Date of Birth/Sex: Treating RN: [birth year] (89 y.o. [name] Primary Care Rondel Episcopo: [name] Other Clinician: Referring Kameka Whan: Treating Blakley Michna/Extender: [name] in Treatment: 6 Wound Status Wound Number: 9 Primary Pressure Ulcer Etiology: Wound Location: Left Calcaneus Wound Open Wounding Event: Pressure Injury Status: Date Acquired: 10/22/2022 Comorbid Cataracts, Anemia, Arrhythmia, Congestive Heart Failure, [name] Of Treatment: 6 History: Hypotension, Peripheral Venous Disease, Osteomyelitis, Clustered Wound: No Neuropathy Photos Wound Measurements Length: (cm) 0.7 Width: (cm) 0.6 Depth: (cm) 0.1 Area: (cm) 0.33 Volume: (cm) 0.033 % Reduction in Area: 0% % Reduction in Volume: 66.7% Epithelialization: Small (1-33%) Tunneling:  No Undermining: No Wound Description Classification: Category/Stage II Wound Margin: Distinct, outline attached Exudate Amount: Small Exudate Type: Serous Exudate Color: amber Foul Odor After Cleansing: No Slough/Fibrino Yes Wound Bed Granulation Amount: Small (1-33%) Exposed Structure Granulation Quality: Pink, Pale Fascia Exposed: No Necrotic Amount: Large (67-100%) Fat Layer (Subcutaneous Tissue) Exposed: Yes Necrotic Quality: Adherent Slough Tendon Exposed: No Muscle Exposed: No Joint Exposed: No Bone Exposed: No Periwound Skin Texture Texture Color No Abnormalities Noted: Yes No Abnormalities Noted: Yes Moisture Temperature / Pain No Abnormalities Noted: Yes Temperature: No Abnormality Tenderness on Palpation: Yes Electronic Signature(s) Signed: 05/05/2023 2:03:55 PM By: [name] Entered By: [name] on 04/23/2023 15:46:19 Vitals Details -------------------------------------------------------------------------------- Modena Morrow ([id number]) [id number] Page 15 of 15 Patient Name: Date of Service: ZAREENA, YAU 04/23/2023 2:30 PM Medical Record Number: [id number] Patient Account Number: [id number] Date of Birth/Sex: Treating RN: [birth year] (89 y.o. [name] Primary Care Elissa Grieshop: [name] Other Clinician: Referring Loys Hoselton: Treating Yazmeen Woolf/Extender: [name] in Treatment: 6 Vital Signs Time Taken: 15:17 Temperature (F): 98.2 Height (in): 64 Pulse (bpm): 74 Weight (lbs): 157 Respiratory Rate (breaths/min): 18 Body Mass Index (BMI): 26.9 Blood Pressure (mmHg): 185/71 Reference Range: 80 - 120 mg / dl Electronic Signature(s) Signed: 05/05/2023 2:03:55 PM By: [name] Entered By: [name] on 04/23/2023 15:18:02

## 2023-05-05 NOTE — Progress Notes (Signed)
DEERICKA, LUNNY ([id number]) [id number] Page 1 of 19 Visit Report for 04/30/2023 Chief Complaint Document Details Patient Name: Date of Service: [name], [name] 04/30/2023 2:30 PM Medical Record Number: [id number] Patient Account Number: [id number] Date of Birth/Sex: Treating RN: 10-26-1927 (89 y.o. F) Primary Care Provider: Jorge [name] Other Clinician: Referring Provider: Treating Provider/Extender: [name] in Treatment: 7 Information Obtained from: Patient Chief Complaint 01/28/2022; right second toe amputation site dehiscence and bilateral lower extremity wounds. 03/10/2023: pressure ulcers of right heel, ulcers on toes Electronic Signature(s) Signed: 04/30/2023 3:54:56 PM By: Megan Guess MD FACS Entered By: [name] on 04/30/2023 15:54:56 -------------------------------------------------------------------------------- Debridement Details Patient Name: Date of Service: Megan Reap. 04/30/2023 2:30 PM Medical Record Number: [id number] Patient Account Number: [id number] Date of Birth/Sex: Treating RN: 1928-02-21 (89 y.o. [name] Primary Care Provider: Jorge [name] Other Clinician: Referring Provider: Treating Provider/Extender: [name] in Treatment: 7 Debridement Performed for Assessment: Wound #13 Left,Dorsal T Great oe Performed By: Physician Megan Guess, MD Debridement Type: Debridement Severity of Tissue Pre Debridement: Fat layer exposed Level of Consciousness (Pre-procedure): Awake and Alert Pre-procedure Verification/Time Out Yes - 15:26 Taken: Start Time: 15:28 Pain Control: Lidocaine 4% Topical Solution Percent of Wound Bed Debrided: 100% T Area Debrided (cm): otal 0.42 Tissue and other material debrided: Non-Viable, Eschar, Slough, Slough Level: Non-Viable Tissue Debridement Description: Selective/Open Wound Instrument: Curette Bleeding:  Minimum Hemostasis Achieved: Pressure End Time: 15:30 Procedural Pain: 0 Post Procedural Pain: 0 Response to Treatment: Procedure was tolerated well Level of Consciousness (Post- Awake and Alert procedure): Post Debridement Measurements of Total Wound Length: (cm) 0.6 Width: (cm) 0.9 Depth: (cm) 0.1 Volume: (cm) 0.042 [name], [name] ([id number]) [id number] Page 2 of 19 Character of Wound/Ulcer Post Debridement: Improved Severity of Tissue Post Debridement: Fat layer exposed Post Procedure Diagnosis Same as Pre-procedure Electronic Signature(s) Signed: 04/30/2023 4:10:53 PM By: Megan Guess MD FACS Signed: 05/05/2023 2:02:59 PM By: [name] Entered By: [name] on 04/30/2023 15:28:34 -------------------------------------------------------------------------------- Debridement Details Patient Name: Date of Service: Megan Reap. 04/30/2023 2:30 PM Medical Record Number: [id number] Patient Account Number: [id number] Date of Birth/Sex: Treating RN: [birth year] (89 y.o. [name] Primary Care Provider: Jorge [name] Other Clinician: Referring Provider: Treating Provider/Extender: [name] in Treatment: 7 Debridement Performed for Assessment: Wound #12 Left T Great oe Performed By: Physician Megan Guess, MD Debridement Type: Debridement Severity of Tissue Pre Debridement: Fat layer exposed Level of Consciousness (Pre-procedure): Awake and Alert Pre-procedure Verification/Time Out Yes - 15:26 Taken: Start Time: 15:28 Pain Control: Lidocaine 4% Topical Solution Percent of Wound Bed Debrided: 100% T Area Debrided (cm): otal 0.2 Tissue and other material debrided: Non-Viable, Eschar, Slough, Slough Level: Non-Viable Tissue Debridement Description: Selective/Open Wound Instrument: Curette Bleeding: Minimum Hemostasis Achieved: Pressure End Time: 15:30 Procedural Pain: 0 Post Procedural Pain:  0 Response to Treatment: Procedure was tolerated well Level of Consciousness (Post- Awake and Alert procedure): Post Debridement Measurements of Total Wound Length: (cm) 0.5 Width: (cm) 0.5 Depth: (cm) 0.1 Volume: (cm) 0.02 Character of Wound/Ulcer Post Debridement: Improved Severity of Tissue Post Debridement: Fat layer exposed Post Procedure Diagnosis Same as Pre-procedure Electronic Signature(s) Signed: 04/30/2023 4:10:53 PM By: Megan Guess MD FACS Signed: 05/05/2023 2:02:59 PM By: [name] Entered By: [name] on 04/30/2023 15:28:55 [name], [name] ([id number]) [id number] Page 3 of 19 -------------------------------------------------------------------------------- Debridement Details Patient Name: Date of Service: [name]. 04/30/2023 2:30 PM Medical Record Number:  [id number] Patient Account Number: [id number] Date of Birth/Sex: Treating RN: 1927/09/23 (89 y.o. [name] Primary Care Provider: Jorge [name] Other Clinician: Referring Provider: Treating Provider/Extender: [name] in Treatment: 7 Debridement Performed for Assessment: Wound #10 Left T Second oe Performed By: Physician Megan Guess, MD Debridement Type: Debridement Level of Consciousness (Pre-procedure): Awake and Alert Pre-procedure Verification/Time Out Yes - 15:26 Taken: Start Time: 15:28 Pain Control: Lidocaine 4% Topical Solution Percent of Wound Bed Debrided: 100% T Area Debrided (cm): otal 0.24 Tissue and other material debrided: Non-Viable, Eschar, Slough, Slough Level: Non-Viable Tissue Debridement Description: Selective/Open Wound Instrument: Curette Bleeding: Minimum Hemostasis Achieved: Pressure End Time: 15:30 Procedural Pain: 0 Post Procedural Pain: 0 Response to Treatment: Procedure was tolerated well Level of Consciousness (Post- Awake and Alert procedure): Post Debridement Measurements of Total  Wound Length: (cm) 0.5 Stage: Category/Stage IV Width: (cm) 0.6 Depth: (cm) 0.1 Volume: (cm) 0.024 Character of Wound/Ulcer Post Debridement: Improved Post Procedure Diagnosis Same as Pre-procedure Electronic Signature(s) Signed: 04/30/2023 4:10:53 PM By: Megan Guess MD FACS Signed: 05/05/2023 2:02:59 PM By: [name] Entered By: [name] on 04/30/2023 15:30:52 -------------------------------------------------------------------------------- Debridement Details Patient Name: Date of Service: Megan Reap. 04/30/2023 2:30 PM Medical Record Number: [id number] Patient Account Number: [id number] Date of Birth/Sex: Treating RN: [birth year] (89 y.o. [name] Primary Care Provider: Jorge [name] Other Clinician: Referring Provider: Treating Provider/Extender: [name] in Treatment: 7 Debridement Performed for Assessment: Wound #9 Left Calcaneus Performed By: Physician Megan Guess, MD Debridement Type: Debridement Level of Consciousness (Pre-procedure): Awake and Alert Pre-procedure Verification/Time Out Yes - 15:26 Taken: Start Time: 15:28 NEKESHA, MESTER ([id number]) [id number] Page 4 of 19 Pain Control: Lidocaine 4% Topical Solution Percent of Wound Bed Debrided: 100% T Area Debrided (cm): otal 0.13 Tissue and other material debrided: Non-Viable, Eschar, Slough, Subcutaneous, Slough Level: Skin/Subcutaneous Tissue Debridement Description: Excisional Instrument: Curette Bleeding: Minimum Hemostasis Achieved: Pressure End Time: 15:30 Procedural Pain: 0 Post Procedural Pain: 0 Response to Treatment: Procedure was tolerated well Level of Consciousness (Post- Awake and Alert procedure): Post Debridement Measurements of Total Wound Length: (cm) 0.4 Stage: Category/Stage II Width: (cm) 0.4 Depth: (cm) 0.1 Volume: (cm) 0.013 Character of Wound/Ulcer Post Debridement: Improved Post Procedure  Diagnosis Same as Pre-procedure Electronic Signature(s) Signed: 04/30/2023 4:10:53 PM By: Megan Guess MD FACS Signed: 05/05/2023 2:02:59 PM By: [name] Entered By: [name] on 04/30/2023 15:31:44 -------------------------------------------------------------------------------- Debridement Details Patient Name: Date of Service: Megan Reap. 04/30/2023 2:30 PM Medical Record Number: [id number] Patient Account Number: [id number] Date of Birth/Sex: Treating RN: Feb 24, 1928 (89 y.o. [name] Primary Care Provider: Jorge [name] Other Clinician: Referring Provider: Treating Provider/Extender: [name] in Treatment: 7 Debridement Performed for Assessment: Wound #11 Right T Second oe Performed By: Physician Megan Guess, MD Debridement Type: Debridement Level of Consciousness (Pre-procedure): Awake and Alert Pre-procedure Verification/Time Out Yes - 15:26 Taken: Start Time: 15:28 Pain Control: Lidocaine 4% Topical Solution Percent of Wound Bed Debrided: 100% T Area Debrided (cm): otal 0.5 Tissue and other material debrided: Non-Viable, Eschar, Slough, Slough Level: Non-Viable Tissue Debridement Description: Selective/Open Wound Instrument: Curette Bleeding: Minimum Hemostasis Achieved: Pressure End Time: 15:30 Procedural Pain: 0 Post Procedural Pain: 0 Response to Treatment: Procedure was tolerated well Level of Consciousness (Post- Awake and Alert procedure): Post Debridement Measurements of Total Wound Length: (cm) 0.8 Stage: Category/Stage IV Width: (cm) 0.8 Depth: (cm) 0.1 Volume: (cm) 0.05 [name], [name] ([id number]) [id number] Page 5  of 19 Character of Wound/Ulcer Post Debridement: Improved Post Procedure Diagnosis Same as Pre-procedure Electronic Signature(s) Signed: 04/30/2023 4:10:53 PM By: Megan Guess MD FACS Signed: 05/05/2023 2:02:59 PM By: [name] Entered By:  [name] on 04/30/2023 15:32:23 -------------------------------------------------------------------------------- Debridement Details Patient Name: Date of Service: Megan Reap. 04/30/2023 2:30 PM Medical Record Number: [id number] Patient Account Number: [id number] Date of Birth/Sex: Treating RN: 1927/11/25 (89 y.o. [name] Primary Care Provider: Jorge [name] Other Clinician: Referring Provider: Treating Provider/Extender: [name] in Treatment: 7 Debridement Performed for Assessment: Wound #14 Left T Third oe Performed By: Physician Megan Guess, MD Debridement Type: Debridement Severity of Tissue Pre Debridement: Fat layer exposed Level of Consciousness (Pre-procedure): Awake and Alert Pre-procedure Verification/Time Out Yes - 15:26 Taken: Start Time: 15:28 Pain Control: Lidocaine 4% Topical Solution Percent of Wound Bed Debrided: 100% T Area Debrided (cm): otal 0.13 Tissue and other material debrided: Non-Viable, Eschar, Slough, Slough Level: Non-Viable Tissue Debridement Description: Selective/Open Wound Instrument: Curette Bleeding: Minimum Hemostasis Achieved: Pressure End Time: 15:30 Procedural Pain: 0 Post Procedural Pain: 0 Response to Treatment: Procedure was tolerated well Level of Consciousness (Post- Awake and Alert procedure): Post Debridement Measurements of Total Wound Length: (cm) 0.4 Width: (cm) 0.4 Depth: (cm) 0.1 Volume: (cm) 0.013 Character of Wound/Ulcer Post Debridement: Improved Severity of Tissue Post Debridement: Fat layer exposed Post Procedure Diagnosis Same as Pre-procedure Electronic Signature(s) Signed: 04/30/2023 4:10:53 PM By: Megan Guess MD FACS Signed: 05/05/2023 2:02:59 PM By: [name] Entered By: [name] on 04/30/2023 15:33:23 [name], [name] ([id number]) [id number] Page 6 of  19 -------------------------------------------------------------------------------- Debridement Details Patient Name: Date of Service: [name], [name] 04/30/2023 2:30 PM Medical Record Number: [id number] Patient Account Number: [id number] Date of Birth/Sex: Treating RN: 07-14-1928 (89 y.o. [name] Primary Care Provider: Jorge [name] Other Clinician: Referring Provider: Treating Provider/Extender: [name] in Treatment: 7 Debridement Performed for Assessment: Wound #15 Right,Dorsal T Great oe Performed By: Physician Megan Guess, MD Debridement Type: Debridement Severity of Tissue Pre Debridement: Fat layer exposed Level of Consciousness (Pre-procedure): Awake and Alert Pre-procedure Verification/Time Out Yes - 15:26 Taken: Start Time: 15:28 Pain Control: Lidocaine 4% Topical Solution Percent of Wound Bed Debrided: 100% T Area Debrided (cm): otal 0.2 Tissue and other material debrided: Non-Viable, Eschar, Slough, Slough Level: Non-Viable Tissue Debridement Description: Selective/Open Wound Instrument: Curette Bleeding: Minimum Hemostasis Achieved: Pressure End Time: 15:30 Procedural Pain: 0 Post Procedural Pain: 0 Response to Treatment: Procedure was tolerated well Level of Consciousness (Post- Awake and Alert procedure): Post Debridement Measurements of Total Wound Length: (cm) 0.5 Width: (cm) 0.5 Depth: (cm) 0.1 Volume: (cm) 0.02 Character of Wound/Ulcer Post Debridement: Improved Severity of Tissue Post Debridement: Fat layer exposed Post Procedure Diagnosis Same as Pre-procedure Electronic Signature(s) Signed: 04/30/2023 4:10:53 PM By: Megan Guess MD FACS Signed: 05/05/2023 2:02:59 PM By: [name] Entered By: [name] on 04/30/2023 15:34:06 -------------------------------------------------------------------------------- HPI Details Patient Name: Date of Service: Megan Reap. 04/30/2023 2:30 PM Medical  Record Number: [id number] Patient Account Number: [id number] Date of Birth/Sex: Treating RN: [birth year] (89 y.o. F) Primary Care Provider: Jorge [name] Other Clinician: Referring Provider: Treating Provider/Extender: [name] in Treatment: 7 History of Present Illness HPI Description: Admission 01/28/2022 Ms. 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 She states she has tried Medihoney and silver alginate in the past to this area with little benefit. She also has 2 small areas limited to skin breakdown to her lower extremities bilaterally. She has chronic venous insufficiency but not has not been wearing her compression stockings. She states she bumped her legs against an object and not so the wound started. She has been using Medihoney to the sites. She denies signs of infection. 6/2; patient presents for follow-up. She had an x-ray of her right foot done at last clinic visit and this was negative for evidence of osteomyelitis. She also had Perz, Shanyla [name] ([id number]) [id number] Page 7 of 19 a wound culture done that showed extra high levels of Staph aureus. I recommended Keystone antibiotics for this and this was ordered. She had ABIs with TBI's done as well that showed monophasic waveforms to the right foot with TBI of 0 and an ABI of 0.52. Urgent referral was made to vein and vascular and she saw Dr. Durwin Nora on 6/1, yesterday and he recommended an arteriogram. This is scheduled for 6/16. Patient also reports a new wound to the right great toe. This is a blister that has ruptured. She also reports increased erythema to the toe. 6/6; the patient was worked in  urgently today at the insistence of her daughter out of concern for a new wound on the lateral part of the plantar right great toe. She has her original postsurgical wound after the amputation of the right second toe, she has a wound on the medial part of the right great toe. The patient is apparently going for an angiogram by Dr. Durwin Nora in 2 [name] time. 6/13; patient presents for follow-up. She has been using bacitracin to the abrasion on the right great toe. She has been using collagen and Keystone antibiotic to the amputation site. She has no issues or complaints today. She denies signs of infection. 6/22; patient presents for follow-up. She states that her abdominal aortogram was canceled due to her renal function. She has been using Keystone antibiotics to the amputation site and Medihoney to the right great toe wound. At the pace of the right great toe she has a slitlike open area that she thinks was caused by the tape from the dressing. 6/29; patient presents for follow-up. She has been using Keystone antibiotics to the amputation site along with collagen. She has been using Medihoney to the right great toe wound. She has no other wounds. She denies signs of infection. 7/13; patient presents for follow-up. She has been using Keystone antibiotics and collagen to the wound sites. She currently denies signs of infection. She states she is scheduled to see me nephrology next month. 7/20; patient presents for follow-up. She continue Keystone antibiotics and collagen to the wound sites. She has no issues or complaints today. 8/1; patient presents for follow up. She continues to use keystone antibiotics and collagen to the wound sites. She has no issues or complaints today. 8/15; patient presents for follow-up. She has been using Keystone antibiotics and collagen to the wound sites. She followed up with her nephrologist who made medication changes. She is supposed to get a repeat BMP in 2 [name]. Her  decrease in renal function was a limiting factor in obtaining an arteriogram for potential intervention for revascularization. She currently denies signs of infection. 9/2; patient presents for follow-up. She has been using Keystone antibiotic and  collagen to the wound beds. She has no issues or complaints today. Reports there has been improvement in kidney function however not cleared to have her arteriogram just yet. 10/10; patient presents for follow-up. She has been using Keystone antibiotic and collagen to the wound beds. She reports 2 new wounds 1 to the anterior right lower extremity and another to the plantar aspect of the right foot. She states that the right plantar foot wound was caused by the home health nurse changing the dressing and causing a skin tear. She is not sure how the right anterior leg wound started. It appears to be from trauma. She denies signs of infection. 10/19; patient presents for follow-up. She has been using Keystone antibiotic and collagen to the right great toe wound. She is been using silver alginate to the right anterior and right plantar foot wound. She has been using Tubigrip to the right lower extremity. The plantar foot wound has healed. She has no issues or complaints today. 11/3; since the patient was last here she was seen in urgent care apparently for an area on the dorsal aspect of the right fifth toe perhaps over the PIP. I saw a picture of this on the daughter's cell phone. There was slough on this. Urgent care gave him doxycycline. She is also changed the dressing to all wounds back the [facility] and collagen which includes her right leg and left first toe 11/16; patient presents for follow-up. She has a new wound to the left knee. She states she fell. She has been using antibiotic ointment and collagen to this area. She has been using collagen and Keystone antibiotic to the right great toe wound. The anterior right leg wound is healed. She denies signs  of infection. 11/30; patient presents for follow-up. The right great toe wound has healed. She has 1 remaining wound to the left knee. She has been using Hydrofera Blue and Medihoney here. 12/19; patient presents for follow-up. Her left knee wound has healed. She has no issues or complaints today. 09/30/2022 Patient's daughter called for an appointment due to increased swelling to her lower extremities bilaterally. Today patient presents with increased swelling to her lower extremities although there is no increased redness or warmth. She was evaluated by her nephrologist who increased her diuretics. Per patient and daughter her swelling has gone down to the leg Over the past several days. She does not have any open wounds. She was advised to elevate her legs and not consume excess salt By her PCP and nephrologist. Patient has compression stockings that she has been using sporadically. READMISSION 03/10/2023 Since her last visit to the clinic, the patient has been hospitalized at least twice, in February with COVID pneumonia and CHF exacerbation. She was discharged to a skilled nursing facility and then readmitted in April with fevers. She was noted to have pressure ulcers on her heels and sacrum. Per family declined skilled nursing placement upon discharge and she has apparently been residing at home. She has followed with the vascular surgery clinic regarding peripheral artery disease and due to ulcerations on her feet, she ultimately underwent arteriography with balloon angioplasties of the superficial femoral artery, popliteal artery and peroneal artery on the left, along with shockwave ultrasound assisted balloon angioplasty of the popliteal artery and distal SFA. Given her multiple significant medical comorbidities, she is not a candidate for open surgery. She is deemed to be maximally vascularized this procedure, which was performed on 04 March 2023. T oday, she has a stage  III pressure ulcer on  her left heel and wounds on the PIP joints of her right third and left second toes. No sacral wound is present and the ulcer on her left heel has closed. 03/18/2023: She has a new ulcer on her left great toe. It was just noticed yesterday and the etiology is unknown. The fat layer is exposed and there is some slough accumulation. The other wounds are all essentially unchanged in size. There is a little bit more granulation tissue on the other toe wounds, but bone is still frankly exposed on both sides. The heel ulcer has thick slough accumulation. 03/26/2023: She has a new wound on the tip of her left third toe. It is black. Neither the patient nor her daughter are aware of how it began. The other wounds are stable. 03/31/2023: The culture that I took from her left third toe returned with MRSA. We contacted the patient's daughter to have her stop levofloxacin and start doxycycline, but she has not yet picked up the new antibiotic. All of her wounds have accumulated slough. Bone remains exposed. She is scheduled to follow- up with vascular surgery tomorrow to evaluate the patency of her revascularization. 04/09/2023: She saw her vascular surgeon and her ABIs are improved; her TBI's could not be checked due to her bandages. She did finally start taking the prescribed doxycycline. All of her wounds look about the same today. 04/23/2023: No significant changes to any of her wounds aside from being slightly smaller other than the left dorsal great toe wound, which is stable. She has accumulated slough and eschar on all of the surfaces. 04/30/2023: All of the wounds are stable with the exception of the left dorsal great toe wound which now has tendon exposed in addition to bone. There is slough and eschar accumulation on all sites. JAMIELEE, KHANAM ([id number]) [id number] Page 8 of 19 Electronic Signature(s) Signed: 04/30/2023 3:55:45 PM By: Megan Guess MD FACS Entered By: [name] on 04/30/2023 15:55:45 -------------------------------------------------------------------------------- Physical Exam Details Patient Name: Date of Service: Megan Reap. 04/30/2023 2:30 PM Medical Record Number: [id number] Patient Account Number: [id number] Date of Birth/Sex: Treating RN: December 14, 1927 (89 y.o. F) Primary Care Provider: Jorge [name] Other Clinician: Referring Provider: Treating Provider/Extender: [name] in Treatment: 7 Constitutional Hypertensive, asymptomatic. . . . no acute distress. Respiratory Normal work of breathing on supplemental oxygen. Notes 04/30/2023: All of the wounds are stable with the exception of the left dorsal great toe wound which now has tendon exposed in addition to bone. There is slough and eschar accumulation on all sites. Electronic Signature(s) Signed: 04/30/2023 3:57:36 PM By: Megan Guess MD FACS Entered By: [name] on 04/30/2023 15:57:36 -------------------------------------------------------------------------------- Physician Orders Details Patient Name: Date of Service: Megan Reap. 04/30/2023 2:30 PM Medical Record Number: [id number] Patient Account Number: [id number] Date of Birth/Sex: Treating RN: Jan 06, 1928 (89 y.o. 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 - Dr. Lady Gary - room 1 - Please schedule appointment. Return A Anesthetic (In clinic) Topical Lidocaine 4% applied to wound bed Belgarde, Alyze [name] ([id number]) [id number] Page 9 of [address] Bathing/ Shower/ Hygiene May shower and wash wound with soap and water. Edema Control - Lymphedema / SCD / Other Elevate legs to the level of the heart or above for 30 minutes daily and/or when sitting for 3-4 times a day throughout the day. Avoid standing for long periods of time. Patient to wear own compression stockings every day. Additional Orders / Instructions Follow Nutritious Diet - add in protein shakes every day to diet - recommend premier protein 500 mg x3 a day vitamin C, zinc 30-50 mg per day Home Health No change in wound care orders this week; continue Home Health for wound care. May utilize formulary equivalent dressing for wound treatment orders unless otherwise specified. Other Home Health Orders/Instructions: - Medi Home Wound Treatment Wound #10 - T Second oe Wound Laterality: Left Cleanser: Soap and Water 1 x Per Day/30 Days Discharge Instructions: May shower and wash wound with dial antibacterial soap and water prior to dressing change. Cleanser: Wound Cleanser 1 x Per Day/30 Days Discharge Instructions: Cleanse the wound with wound cleanser prior to applying a clean dressing using gauze sponges, not tissue or cotton balls. Prim Dressing: Promogran Prisma Matrix, 4.34 (sq in) (silver collagen) (Generic) 1 x Per Day/30 Days ary Discharge Instructions: Moisten collagen with saline or hydrogel Secondary Dressing: Woven Gauze Sponge, Non-Sterile 4x4 in (Generic) 1 x Per Day/30 Days Discharge Instructions: Apply over primary dressing as directed. Secured With: 41M Medipore H Soft Cloth Surgical  T ape, 4 x 10 (in/yd) (Generic) 1 x Per Day/30 Days Discharge Instructions: Secure with tape as directed. Wound #11 - T Second oe Wound Laterality: Right Cleanser: Soap and Water 1 x Per Day/30 Days Discharge Instructions: May shower and wash wound with dial antibacterial soap and water prior to dressing change. Cleanser: Wound Cleanser 1 x Per Day/30 Days Discharge Instructions: Cleanse the wound with wound cleanser prior to applying a clean dressing using gauze sponges, not tissue or cotton balls. Prim Dressing: Promogran Prisma Matrix, 4.34 (sq in) (silver collagen) (Generic) 1 x Per Day/30 Days ary Discharge Instructions: Moisten collagen with saline or hydrogel Secondary Dressing: Woven Gauze Sponge, Non-Sterile 4x4 in (Generic) 1 x Per Day/30 Days Discharge Instructions: Apply over primary dressing as directed. Secured With: 41M Medipore H Soft Cloth Surgical T ape, 4 x 10 (in/yd) (Generic) 1 x Per Day/30 Days Discharge Instructions: Secure with tape as directed. Wound #12 - T Great oe Wound Laterality: Left Cleanser: Soap and Water 1 x Per Day/30 Days Discharge Instructions: May shower and wash wound with dial antibacterial soap and water prior to dressing change. Cleanser: Wound Cleanser 1 x Per Day/30 Days Discharge Instructions: Cleanse the wound with wound cleanser prior to applying a clean dressing using gauze sponges, not tissue or cotton balls. Prim Dressing: Promogran Prisma Matrix, 4.34 (sq in) (silver collagen) (Generic) 1 x Per Day/30 Days ary Discharge Instructions: Moisten collagen with saline or hydrogel Secondary Dressing: Woven Gauze Sponge, Non-Sterile 4x4 in (Generic) 1 x Per Day/30 Days Discharge Instructions: Apply over primary dressing as directed. Secured With: 41M Medipore H Soft Cloth Surgical T ape, 4 x 10 (in/yd) (Generic) 1 x Per Day/30 Days Discharge Instructions: Secure with tape as directed. Wound #13 - T Great oe Wound Laterality:  Dorsal,  Left Cleanser: Soap and Water 1 x Per Day/30 Days Discharge Instructions: May shower and wash wound with dial antibacterial soap and water prior to dressing change. Cleanser: Wound Cleanser 1 x Per Day/30 Days Discharge Instructions: Cleanse the wound with wound cleanser prior to applying a clean dressing using gauze sponges, not tissue or cotton balls. Prim Dressing: Promogran Prisma Matrix, 4.34 (sq in) (silver collagen) (Generic) 1 x Per Day/30 Days ary Discharge Instructions: Moisten collagen with saline or hydrogel Secondary Dressing: Woven Gauze Sponge, Non-Sterile 4x4 in (Generic) 1 x Per Day/30 Days [name], [name] ([id number]) [id number] Page 10 of 19 Discharge Instructions: Apply over primary dressing as directed. Secured With: 91M Medipore H Soft Cloth Surgical T ape, 4 x 10 (in/yd) (Generic) 1 x Per Day/30 Days Discharge Instructions: Secure with tape as directed. Wound #14 - T Third oe Wound Laterality: Left Cleanser: Soap and Water 1 x Per Day/30 Days Discharge Instructions: May shower and wash wound with dial antibacterial soap and water prior to dressing change. Cleanser: Wound Cleanser 1 x Per Day/30 Days Discharge Instructions: Cleanse the wound with wound cleanser prior to applying a clean dressing using gauze sponges, not tissue or cotton balls. Prim Dressing: Promogran Prisma Matrix, 4.34 (sq in) (silver collagen) (Generic) 1 x Per Day/30 Days ary Discharge Instructions: Moisten collagen with saline or hydrogel Secondary Dressing: Woven Gauze Sponge, Non-Sterile 4x4 in (Generic) 1 x Per Day/30 Days Discharge Instructions: Apply over primary dressing as directed. Secured With: 91M Medipore H Soft Cloth Surgical T ape, 4 x 10 (in/yd) (Generic) 1 x Per Day/30 Days Discharge Instructions: Secure with tape as directed. Wound #15 - T Great oe Wound Laterality: Dorsal, Right Cleanser: Soap and Water 1 x Per Day/30 Days Discharge Instructions: May  shower and wash wound with dial antibacterial soap and water prior to dressing change. Cleanser: Wound Cleanser 1 x Per Day/30 Days Discharge Instructions: Cleanse the wound with wound cleanser prior to applying a clean dressing using gauze sponges, not tissue or cotton balls. Prim Dressing: Promogran Prisma Matrix, 4.34 (sq in) (silver collagen) (Generic) 1 x Per Day/30 Days ary Discharge Instructions: Moisten collagen with saline or hydrogel Secondary Dressing: Woven Gauze Sponge, Non-Sterile 4x4 in (Generic) 1 x Per Day/30 Days Discharge Instructions: Apply over primary dressing as directed. Secured With: 91M Medipore H Soft Cloth Surgical T ape, 4 x 10 (in/yd) (Generic) 1 x Per Day/30 Days Discharge Instructions: Secure with tape as directed. Wound #9 - Calcaneus Wound Laterality: Left Cleanser: Soap and Water 1 x Per Day/30 Days Discharge Instructions: May shower and wash wound with dial antibacterial soap and water prior to dressing change. Cleanser: Wound Cleanser 1 x Per Day/30 Days Discharge Instructions: Cleanse the wound with wound cleanser prior to applying a clean dressing using gauze sponges, not tissue or cotton balls. Prim Dressing: Promogran Prisma Matrix, 4.34 (sq in) (silver collagen) (Generic) 1 x Per Day/30 Days ary Discharge Instructions: Moisten collagen with saline or hydrogel Secondary Dressing: ALLEVYN Heel 4 1/2in x 5 1/2in / 10.5cm x 13.5cm (Generic) 1 x Per Day/30 Days Discharge Instructions: Apply over primary dressing as directed. Secondary Dressing: Woven Gauze Sponge, Non-Sterile 4x4 in (Generic) 1 x Per Day/30 Days Discharge Instructions: Apply over primary dressing as directed. Secured With: 91M Medipore H Soft Cloth Surgical T ape, 4 x 10 (in/yd) (Generic) 1 x Per Day/30 Days Discharge Instructions: Secure with tape as directed. Electronic Signature(s) Signed: 04/30/2023 4:10:53 PM By: Megan Guess MD FACS Entered By: Lady Gary,  Javaya [place] on 04/30/2023  16:01:03 -------------------------------------------------------------------------------- Problem List Details Patient Name: Date of Service: SRISTI, OLLILA 04/30/2023 2:30 PM Medical Record Number: [id number] Patient Account Number: [id number] Date of Birth/Sex: Treating RN: Sep 06, 1928 (89 y.o. [name] Lowman, [name] ([id number]) [phone].pdf Page 11 of 19 Primary Care Provider: Jorge [name] Other Clinician: Referring Provider: Treating Provider/Extender: [name] in Treatment: 7 Active Problems ICD-10 Encounter Code Description Active Date MDM Diagnosis [id number] Pressure ulcer of left heel, stage 3 03/10/2023 No Yes 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 03/10/2023 No Yes without evidence of necrosis 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 03/10/2023 No Yes without evidence of necrosis L97.522 Non-pressure chronic ulcer of other part of left foot with fat layer exposed 03/18/2023 No Yes I73.9 Peripheral vascular disease, unspecified 03/10/2023 No Yes I50.32 Chronic diastolic (congestive) heart failure 03/10/2023 No Yes N18.32 Chronic kidney disease, stage 3b 03/10/2023 No Yes I87.2 Venous insufficiency (chronic) (peripheral) 03/10/2023 No Yes Inactive Problems Resolved Problems Electronic Signature(s) Signed: 04/30/2023 3:49:46 PM By: Megan Guess MD FACS Entered By: [name] on 04/30/2023 15:49:46 -------------------------------------------------------------------------------- Progress Note Details Patient Name: Date of Service: Megan Reap. 04/30/2023 2:30 PM Medical Record Number: [id number] Patient Account Number: [id number] Date of Birth/Sex: Treating RN: 06-16-1928 (89 y.o. F) Primary Care Provider: Jorge [name] Other Clinician: Referring Provider: Treating Provider/Extender: [name] in  Treatment: 7 Subjective Chief Complaint Information obtained from Patient 01/28/2022; right second toe amputation site dehiscence and bilateral lower extremity wounds. 03/10/2023: pressure ulcers of right heel, ulcers on toes Mcculley, Baljit [name] ([id number]) [id number] Page 12 of 19 History of Present Illness (HPI) Admission 01/28/2022 Ms. 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 She states she has tried Medihoney and silver alginate in the past to this area with little benefit. She also has 2 small areas limited to skin breakdown to her lower extremities bilaterally. She has chronic venous insufficiency but not has not been wearing her compression stockings. She states she bumped her legs against an object and not so the wound started. She has been using Medihoney to the sites. She denies signs of infection. 6/2; patient presents for follow-up. She had an x-ray of her right foot done at last clinic visit and this was negative for evidence of osteomyelitis. She also had a wound culture done that showed extra high levels of Staph aureus. I recommended Keystone antibiotics for this and this was ordered. She had ABIs with TBI's done as well that showed monophasic waveforms to the right foot with TBI of 0 and an ABI of 0.52. Urgent referral was made to vein and vascular and she saw Dr. Durwin Nora on 6/1, yesterday and he recommended an arteriogram. This is scheduled for 6/16. Patient also reports a new wound to the right great toe. This is a blister that has ruptured. She also reports increased erythema to the toe. 6/6; the patient was worked in urgently today at the insistence of her daughter out of concern  for a new wound on the lateral part of the plantar right great toe. She has her original postsurgical wound after the amputation of the right  second toe, she has a wound on the medial part of the right great toe. The patient is apparently going for an angiogram by Dr. Durwin Nora in 2 [name] time. 6/13; patient presents for follow-up. She has been using bacitracin to the abrasion on the right great toe. She has been using collagen and Keystone antibiotic to the amputation site. She has no issues or complaints today. She denies signs of infection. 6/22; patient presents for follow-up. She states that her abdominal aortogram was canceled due to her renal function. She has been using Keystone antibiotics to the amputation site and Medihoney to the right great toe wound. At the pace of the right great toe she has a slitlike open area that she thinks was caused by the tape from the dressing. 6/29; patient presents for follow-up. She has been using Keystone antibiotics to the amputation site along with collagen. She has been using Medihoney to the right great toe wound. She has no other wounds. She denies signs of infection. 7/13; patient presents for follow-up. She has been using Keystone antibiotics and collagen to the wound sites. She currently denies signs of infection. She states she is scheduled to see me nephrology next month. 7/20; patient presents for follow-up. She continue Keystone antibiotics and collagen to the wound sites. She has no issues or complaints today. 8/1; patient presents for follow up. She continues to use keystone antibiotics and collagen to the wound sites. She has no issues or complaints today. 8/15; patient presents for follow-up. She has been using Keystone antibiotics and collagen to the wound sites. She followed up with her nephrologist who made medication changes. She is supposed to get a repeat BMP in 2 [name]. Her decrease in renal function was a limiting factor in obtaining an  arteriogram for potential intervention for revascularization. She currently denies signs of infection. 9/2; patient presents for follow-up. She has been using Keystone antibiotic and collagen to the wound beds. She has no issues or complaints today. Reports there has been improvement in kidney function however not cleared to have her arteriogram just yet. 10/10; patient presents for follow-up. She has been using Keystone antibiotic and collagen to the wound beds. She reports 2 new wounds 1 to the anterior right lower extremity and another to the plantar aspect of the right foot. She states that the right plantar foot wound was caused by the home health nurse changing the dressing and causing a skin tear. She is not sure how the right anterior leg wound started. It appears to be from trauma. She denies signs of infection. 10/19; patient presents for follow-up. She has been using Keystone antibiotic and collagen to the right great toe wound. She is been using silver alginate to the right anterior and right plantar foot wound. She has been using Tubigrip to the right lower extremity. The plantar foot wound has healed. She has no issues or complaints today. 11/3; since the patient was last here she was seen in urgent care apparently for an area on the dorsal aspect of the right fifth toe perhaps over the PIP. I saw a picture of this on the daughter's cell phone. There was slough on this. Urgent care gave him doxycycline. She is also changed the dressing to all wounds back the [facility] and collagen which includes her right leg and left first toe 11/16; patient presents for follow-up. She has a new wound to the left knee. She states she fell. She has been using antibiotic ointment and collagen to this  area. She has been using collagen and Keystone antibiotic to the right great toe wound. The anterior right leg wound is healed. She denies signs of infection. 11/30; patient presents for follow-up. The right  great toe wound has healed. She has 1 remaining wound to the left knee. She has been using Hydrofera Blue and Medihoney here. 12/19; patient presents for follow-up. Her left knee wound has healed. She has no issues or complaints today. 09/30/2022 Patient's daughter called for an appointment due to increased swelling to her lower extremities bilaterally. Today patient presents with increased swelling to her lower extremities although there is no increased redness or warmth. She was evaluated by her nephrologist who increased her diuretics. Per patient and daughter her swelling has gone down to the leg Over the past several days. She does not have any open wounds. She was advised to elevate her legs and not consume excess salt By her PCP and nephrologist. Patient has compression stockings that she has been using sporadically. READMISSION 03/10/2023 Since her last visit to the clinic, the patient has been hospitalized at least twice, in February with COVID pneumonia and CHF exacerbation. She was discharged to a skilled nursing facility and then readmitted in April with fevers. She was noted to have pressure ulcers on her heels and sacrum. Per family declined skilled nursing placement upon discharge and she has apparently been residing at home. She has followed with the vascular surgery clinic regarding peripheral artery disease and due to ulcerations on her feet, she ultimately underwent arteriography with balloon angioplasties of the superficial femoral artery, popliteal artery and peroneal artery on the left, along with shockwave ultrasound assisted balloon angioplasty of the popliteal artery and distal SFA. Given her multiple significant medical comorbidities, she is not a candidate for open surgery. She is deemed to be maximally vascularized this procedure, which was performed on 04 March 2023. T oday, she has a stage III pressure ulcer on her left heel and wounds on the PIP joints of her right third  and left second toes. No sacral wound is present and the ulcer on her left heel has closed. 03/18/2023: She has a new ulcer on her left great toe. It was just noticed yesterday and the etiology is unknown. The fat layer is exposed and there is some slough accumulation. The other wounds are all essentially unchanged in size. There is a little bit more granulation tissue on the other toe wounds, but bone is still frankly exposed on both sides. The heel ulcer has thick slough accumulation. 03/26/2023: She has a new wound on the tip of her left third toe. It is black. Neither the patient nor her daughter are aware of how it began. The other wounds are stable. 03/31/2023: The culture that I took from her left third toe returned with MRSA. We contacted the patient's daughter to have her stop levofloxacin and start doxycycline, but she has not yet picked up the new antibiotic. All of her wounds have accumulated slough. Bone remains exposed. She is scheduled to follow- up with vascular surgery tomorrow to evaluate the patency of her revascularization. [name], [name] ([id number]) [id number] Page 13 of 19 04/09/2023: She saw her vascular surgeon and her ABIs are improved; her TBI's could not be checked due to her bandages. She did finally start taking the prescribed doxycycline. All of her wounds look about the same today. 04/23/2023: No significant changes to any of her wounds aside from being slightly smaller other than the left dorsal great toe  wound, which is stable. She has accumulated slough and eschar on all of the surfaces. 04/30/2023: All of the wounds are stable with the exception of the left dorsal great toe wound which now has tendon exposed in addition to bone. There is slough and eschar accumulation on all sites. Patient History Information obtained from Patient, Caregiver, Chart. Family History Cancer - Siblings, Heart Disease, Stroke - Mother, No family history of  Diabetes. Social History Former smoker - quit 2003, Marital Status - Widowed, Alcohol Use - Never, Drug Use - No History, Caffeine Use - Never. Medical History Eyes Patient has history of Cataracts Hematologic/Lymphatic Patient has history of Anemia Respiratory Denies history of Chronic Obstructive Pulmonary Disease (COPD) Cardiovascular Patient has history of Arrhythmia - a-fib, Congestive Heart Failure, Hypotension, Peripheral Venous Disease Musculoskeletal Patient has history of 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 Neurologic Patient has history of Neuropathy Hospitalization/Surgery History - Abdominal aortogram w/lower extremity. - Amputation toe (Right). - Colonoscopy. - Elbow surgery. - Laparoscopic hysterectomy. Medical A Surgical History Notes nd Hematologic/Lymphatic Thrombocytopenia, Anticoagulant long-term use Respiratory Pulmonary embolus Gastrointestinal Hiatal hernia Endocrine Hyperthyroidism, Hypothyroidism Genitourinary CKD stage III Musculoskeletal arthritis Objective Constitutional Hypertensive, asymptomatic. no acute distress. Vitals Time Taken: 2:45 AM, Height: 64 in, Weight: 157 lbs, BMI: 26.9, Temperature: 97.8 F, Pulse: 77 bpm, Respiratory Rate: 18 breaths/min, Blood Pressure: 157/72 mmHg. Respiratory Normal work of breathing on supplemental oxygen. General Notes: 04/30/2023: All of the wounds are stable with the exception of the left dorsal great toe wound which now has tendon exposed in addition to bone. There is slough and eschar accumulation on all sites. Integumentary (Hair, Skin) Wound #10 status is Open. Original cause of wound was Pressure Injury. The date acquired was: 10/09/2022. The wound has been in treatment 7 [name]. The wound is located on the Left T Second. The wound measures 0.5cm length x 0.6cm width x 0.1cm depth; 0.236cm^2 area and 0.024cm^3 volume. There is oe bone, joint, and Fat Layer (Subcutaneous Tissue) exposed.  There is no tunneling or undermining noted. There is a small amount of serosanguineous drainage noted. The wound margin is distinct with the outline attached to the wound base. There is large (67-100%) red granulation within the wound bed. There is a small (1-33%) amount of necrotic tissue within the wound bed including Adherent Slough. The periwound skin appearance had no abnormalities noted for texture. The periwound skin appearance had no abnormalities noted for moisture. The periwound skin appearance had no abnormalities noted for color. Periwound temperature was noted as No Abnormality. Wound #11 status is Open. Original cause of wound was Pressure Injury. The date acquired was: 10/09/2022. The wound has been in treatment 7 [name]. The wound is located on the Right T Second. The wound measures 0.8cm length x 0.8cm width x 0.1cm depth; 0.503cm^2 area and 0.05cm^3 volume. There is oe bone, joint, and Fat Layer (Subcutaneous Tissue) exposed. There is no tunneling or undermining noted. There is a medium amount of serosanguineous drainage noted. The wound margin is distinct with the outline attached to the wound base. There is large (67-100%) red granulation within the wound bed. There is a small [name], [name] ([id number]) [id number] Page 14 of 19 (1-33%) amount of necrotic tissue within the wound bed including Adherent Slough. The periwound skin appearance had no abnormalities noted for texture. The periwound skin appearance had no abnormalities noted for moisture. The periwound skin appearance had no abnormalities noted for color. Periwound temperature was noted as No Abnormality. The periwound has  tenderness on palpation. Wound #12 status is Open. Original cause of wound was Gradually Appeared. The date acquired was: 03/17/2023. The wound has been in treatment 6 [name]. The wound is located on the Left T Great. The wound measures 0.5cm length x 0.5cm width x 0.1cm depth;  0.196cm^2 area and 0.02cm^3 volume. There is Fat oe Layer (Subcutaneous Tissue) exposed. There is no tunneling or undermining noted. There is a small amount of purulent drainage noted. The wound margin is flat and intact. There is small (1-33%) pink granulation within the wound bed. There is a large (67-100%) amount of necrotic tissue within the wound bed including Adherent Slough. The periwound skin appearance had no abnormalities noted for texture. The periwound skin appearance had no abnormalities noted for moisture. The periwound skin appearance did not exhibit: Erythema. Periwound temperature was noted as No Abnormality. The periwound has tenderness on palpation. Wound #13 status is Open. Original cause of wound was Gradually Appeared. The date acquired was: 03/24/2023. The wound has been in treatment 5 [name]. The wound is located on the Left,Dorsal T Great. The wound measures 0.6cm length x 0.9cm width x 0.1cm depth; 0.424cm^2 area and 0.042cm^3 volume. oe There is Fat Layer (Subcutaneous Tissue) exposed. There is no tunneling or undermining noted. There is a medium amount of serosanguineous drainage noted. The wound margin is distinct with the outline attached to the wound base. There is small (1-33%) red granulation within the wound bed. There is a large (67-100%) amount of necrotic tissue within the wound bed including Eschar and Adherent Slough. The periwound skin appearance had no abnormalities noted for texture. The periwound skin appearance had no abnormalities noted for moisture. The periwound skin appearance exhibited: Erythema. The surrounding wound skin color is noted with erythema which is circumferential. Periwound temperature was noted as No Abnormality. The periwound has tenderness on palpation. Wound #14 status is Open. Original cause of wound was Gradually Appeared. The date acquired was: 03/24/2023. The wound has been in treatment 5 [name]. The wound is located on the Left T  Third. The wound measures 0.4cm length x 0.4cm width x 0.1cm depth; 0.126cm^2 area and 0.013cm^3 volume. There is oe bone and Fat Layer (Subcutaneous Tissue) exposed. There is no tunneling or undermining noted. There is a medium amount of serosanguineous drainage noted. The wound margin is distinct with the outline attached to the wound base. There is large (67-100%) red granulation within the wound bed. There is a small (1-33%) amount of necrotic tissue within the wound bed including Adherent Slough. The periwound skin appearance exhibited: Scarring, Hemosiderin Staining. The periwound skin appearance did not exhibit: Dry/Scaly, Maceration. Periwound temperature was noted as No Abnormality. The periwound has tenderness on palpation. Wound #15 status is Open. Original cause of wound was Gradually Appeared. The date acquired was: 03/26/2023. The wound has been in treatment 5 [name]. The wound is located on the Right,Dorsal T [place]. The wound measures 0.5cm length x 0.5cm width x 0.1cm depth; 0.196cm^2 area and 0.02cm^3 volume. oe There is Fat Layer (Subcutaneous Tissue) exposed. There is no tunneling or undermining noted. There is a medium amount of serosanguineous drainage noted. The wound margin is flat and intact. There is large (67-100%) red granulation within the wound bed. There is a small (1-33%) amount of necrotic tissue within the wound bed including Adherent Slough. The periwound skin appearance had no abnormalities noted for texture. The periwound skin appearance had no abnormalities noted for moisture. The periwound skin appearance had no abnormalities noted for  color. Periwound temperature was noted as No Abnormality. The periwound has tenderness on palpation. Wound #9 status is Open. Original cause of wound was Pressure Injury. The date acquired was: 10/22/2022. The wound has been in treatment 7 [name]. The wound is located on the Left Calcaneus. The wound measures 0.4cm length x 0.4cm width  x 0.1cm depth; 0.126cm^2 area and 0.013cm^3 volume. There is Fat Layer (Subcutaneous Tissue) exposed. There is no tunneling or undermining noted. There is a small amount of serous drainage noted. The wound margin is distinct with the outline attached to the wound base. There is small (1-33%) pink, pale granulation within the wound bed. There is a large (67-100%) amount of necrotic tissue within the wound bed. The periwound skin appearance had no abnormalities noted for texture. The periwound skin appearance had no abnormalities noted for moisture. The periwound skin appearance had no abnormalities noted for color. Periwound temperature was noted as No Abnormality. The periwound has tenderness on palpation. 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 There was a Selective/Open Wound Non-Viable Tissue oe Debridement with a total area of 0.24 sq cm performed by Megan Guess, MD. With the following instrument(s): Curette to remove Non-Viable tissue/material. Material removed includes Eschar and Slough and after achieving pain control using Lidocaine 4% T opical Solution. No specimens were taken. A time out was conducted at 15:26, prior to the start of the procedure. A Minimum amount of bleeding was controlled with Pressure. The procedure was tolerated well with a pain level of 0 throughout and a pain level of 0  following the procedure. Post Debridement Measurements: 0.5cm length x 0.6cm width x 0.1cm depth; 0.024cm^3 volume. Post debridement Stage noted as Category/Stage IV. Character of Wound/Ulcer Post Debridement is improved. Post procedure Diagnosis Wound #10: Same as Pre-Procedure Wound #11 Pre-procedure diagnosis of Wound #11 is a Pressure Ulcer located on the Right T Second . There was a Selective/Open Wound Non-Viable Tissue oe Debridement with a total area of 0.5 sq cm performed by Megan Guess, MD. With the following instrument(s): Curette to remove Non-Viable tissue/material. Material removed includes Eschar and Slough and after achieving pain control using Lidocaine 4% T opical Solution. No specimens were taken. A time out was conducted at 15:26, prior to the start of the procedure. A Minimum amount of bleeding was controlled with Pressure. The procedure was tolerated well with a pain level of 0 throughout and a pain level of 0 following the procedure. Post Debridement Measurements: 0.8cm length x 0.8cm width x 0.1cm depth; 0.05cm^3 volume. Post debridement Stage noted as Category/Stage IV. Character of Wound/Ulcer Post Debridement is improved. Post procedure Diagnosis Wound #11: Same as Pre-Procedure [name], [name] ([id number]) [id number] Page 15 of 19 Wound #12 Pre-procedure diagnosis of Wound #12 is an Arterial Insufficiency Ulcer located on the Left T Great .Severity of Tissue Pre Debridement is: Fat layer oe exposed. There was a Selective/Open Wound Non-Viable Tissue Debridement with a total area of 0.2 sq cm performed by Megan Guess, MD. With  the following instrument(s): Curette to remove Non-Viable tissue/material. Material removed includes Eschar and Slough and after achieving pain control using Lidocaine 4% T opical Solution. No specimens were taken. A time out was conducted at 15:26, prior to the start of the procedure. A Minimum amount  of bleeding was controlled with Pressure. The procedure was tolerated well with a pain level of 0 throughout and a pain level of 0 following the procedure. Post Debridement Measurements: 0.5cm length x 0.5cm width x 0.1cm depth; 0.02cm^3 volume. Character of Wound/Ulcer Post Debridement is improved. Severity of Tissue Post Debridement is: Fat layer exposed. Post procedure Diagnosis Wound #12: Same as Pre-Procedure Wound #13 Pre-procedure diagnosis of Wound #13 is an Arterial Insufficiency Ulcer located on the Left,Dorsal T Great .Severity of Tissue Pre Debridement is: Fat layer oe exposed. There was a Selective/Open Wound Non-Viable Tissue Debridement with a total area of 0.42 sq cm performed by Megan Guess, MD. With the following instrument(s): Curette to remove Non-Viable tissue/material. Material removed includes Eschar and Slough and after achieving pain control using Lidocaine 4% T opical Solution. No specimens were taken. A time out was conducted at 15:26, prior to the start of the procedure. A Minimum amount of bleeding was controlled with Pressure. The procedure was tolerated well with a pain level of 0 throughout and a pain level of 0 following the procedure. Post Debridement Measurements: 0.6cm length x 0.9cm width x 0.1cm depth; 0.042cm^3 volume. Character of Wound/Ulcer Post Debridement is improved. Severity of Tissue Post Debridement is: Fat layer exposed. Post procedure Diagnosis Wound #13: Same as Pre-Procedure Wound #14 Pre-procedure diagnosis of Wound #14 is an Arterial Insufficiency Ulcer located on the Left T Third .Severity of Tissue Pre Debridement is: Fat layer oe exposed. There was a Selective/Open Wound Non-Viable Tissue Debridement with a total area of 0.13 sq cm performed by Megan Guess, MD. With the following instrument(s): Curette to remove Non-Viable tissue/material. Material removed includes Eschar and Slough and after achieving pain control using Lidocaine  4% T opical Solution. No specimens were taken. A time out was conducted at 15:26, prior to the start of the procedure. A Minimum amount of bleeding was controlled with Pressure. The procedure was tolerated well with a pain level of 0 throughout and a pain level of 0 following the procedure. Post Debridement Measurements: 0.4cm length x 0.4cm width x 0.1cm depth; 0.013cm^3 volume. Character of Wound/Ulcer Post Debridement is improved. Severity of Tissue Post Debridement is: Fat layer exposed. Post procedure Diagnosis Wound #14: Same as Pre-Procedure Wound #15 Pre-procedure diagnosis of Wound #15 is an Arterial Insufficiency Ulcer located on the Right,Dorsal T Great .Severity of Tissue Pre Debridement is: Fat oe layer exposed. There was a Selective/Open Wound Non-Viable Tissue Debridement with a total area of 0.2 sq cm performed by Megan Guess, MD. With the following instrument(s): Curette to remove Non-Viable tissue/material. Material removed includes Eschar and Slough and after achieving pain control using Lidocaine 4% T opical Solution. No specimens were taken. A time out was conducted at 15:26, prior to the start of the procedure. A Minimum amount of bleeding was controlled with Pressure. The procedure was tolerated well with a pain level of 0 throughout and a pain level of 0 following the procedure. Post Debridement Measurements: 0.5cm length x 0.5cm width x 0.1cm depth; 0.02cm^3 volume. Character of Wound/Ulcer Post Debridement is improved. Severity of Tissue Post Debridement is: Fat layer exposed. Post procedure Diagnosis Wound #15: Same as Pre-Procedure Wound #9 Pre-procedure diagnosis of Wound #9 is  a Pressure Ulcer located on the Left Calcaneus . There was a Excisional Skin/Subcutaneous Tissue Debridement with a total area of 0.13 sq cm performed by Megan Guess, MD. With the following instrument(s): Curette to remove Non-Viable tissue/material. Material removed includes Eschar,  Subcutaneous Tissue, and Slough after achieving pain control using Lidocaine 4% T opical Solution. No specimens were taken. A time out was conducted at 15:26, prior to the start of the procedure. A Minimum amount of bleeding was controlled with Pressure. The procedure was tolerated well with a pain level of 0 throughout and a pain level of 0 following the procedure. Post Debridement Measurements: 0.4cm length x 0.4cm width x 0.1cm depth; 0.013cm^3 volume. Post debridement Stage noted as Category/Stage II. Character of Wound/Ulcer Post Debridement is improved. Post procedure Diagnosis Wound #9: Same as Pre-Procedure Plan Follow-up Appointments: Return Appointment in 1 week. - Dr. Lady Gary - room 1 - Please schedule appointment. Anesthetic: (In clinic) Topical Lidocaine 4% applied to wound bed Bathing/ Shower/ Hygiene: May shower and wash wound with soap and water. Edema Control - Lymphedema / SCD / Other: Elevate legs to the level of the heart or above for 30 minutes daily and/or when sitting for 3-4 times a day throughout the day. Avoid standing for long periods of time. Patient to wear own compression stockings every day. Additional Orders / Instructions: Follow Nutritious Diet - add in protein shakes every day to diet - recommend premier protein 500 mg x3 a day vitamin C, zinc 30-50 mg per day Home Health: No change in wound care orders this week; continue Home Health for wound care. May utilize formulary equivalent dressing for wound treatment orders unless otherwise specified. Other Home Health Orders/Instructions: - Medi Home WOUND #10: - T Second Wound Laterality: Left oe Cleanser: Soap and Water 1 x Per Day/30 Days Discharge Instructions: May shower and wash wound with dial antibacterial soap and water prior to dressing change. Cleanser: Wound Cleanser 1 x Per Day/30 Days Discharge Instructions: Cleanse the wound with wound cleanser prior to applying a clean dressing using gauze  sponges, not tissue or cotton balls. Prim Dressing: Promogran Prisma Matrix, 4.34 (sq in) (silver collagen) (Generic) 1 x Per Day/30 Days ary Discharge Instructions: Moisten collagen with saline or hydrogel Secondary Dressing: Woven Gauze Sponge, Non-Sterile 4x4 in (Generic) 1 x Per Day/30 Days Discharge Instructions: Apply over primary dressing as directed. Secured With: 84M Medipore H Soft Cloth Surgical T ape, 4 x 10 (in/yd) (Generic) 1 x Per Day/30 Days Discharge Instructions: Secure with tape as directed. WOUND #11: - T Second Wound Laterality: Right oe Cleanser: Soap and Water 1 x Per Day/30 Days Discharge Instructions: May shower and wash wound with dial antibacterial soap and water prior to dressing change. AEVA, ALSMAN ([id number]) [id number] Page 16 of 19 Cleanser: Wound Cleanser 1 x Per Day/30 Days Discharge Instructions: Cleanse the wound with wound cleanser prior to applying a clean dressing using gauze sponges, not tissue or cotton balls. Prim Dressing: Promogran Prisma Matrix, 4.34 (sq in) (silver collagen) (Generic) 1 x Per Day/30 Days ary Discharge Instructions: Moisten collagen with saline or hydrogel Secondary Dressing: Woven Gauze Sponge, Non-Sterile 4x4 in (Generic) 1 x Per Day/30 Days Discharge Instructions: Apply over primary dressing as directed. Secured With: 84M Medipore H Soft Cloth Surgical T ape, 4 x 10 (in/yd) (Generic) 1 x Per Day/30 Days Discharge Instructions: Secure with tape as directed. WOUND #12: - T Great Wound Laterality: Left oe Cleanser: Soap and Water 1 x Per Day/30  Days Discharge Instructions: May shower and wash wound with dial antibacterial soap and water prior to dressing change. Cleanser: Wound Cleanser 1 x Per Day/30 Days Discharge Instructions: Cleanse the wound with wound cleanser prior to applying a clean dressing using gauze sponges, not tissue or cotton balls. Prim Dressing: Promogran Prisma Matrix, 4.34 (sq  in) (silver collagen) (Generic) 1 x Per Day/30 Days ary Discharge Instructions: Moisten collagen with saline or hydrogel Secondary Dressing: Woven Gauze Sponge, Non-Sterile 4x4 in (Generic) 1 x Per Day/30 Days Discharge Instructions: Apply over primary dressing as directed. Secured With: 19M Medipore H Soft Cloth Surgical T ape, 4 x 10 (in/yd) (Generic) 1 x Per Day/30 Days Discharge Instructions: Secure with tape as directed. WOUND #13: - T Great Wound Laterality: Dorsal, Left oe Cleanser: Soap and Water 1 x Per Day/30 Days Discharge Instructions: May shower and wash wound with dial antibacterial soap and water prior to dressing change. Cleanser: Wound Cleanser 1 x Per Day/30 Days Discharge Instructions: Cleanse the wound with wound cleanser prior to applying a clean dressing using gauze sponges, not tissue or cotton balls. Prim Dressing: Promogran Prisma Matrix, 4.34 (sq in) (silver collagen) (Generic) 1 x Per Day/30 Days ary Discharge Instructions: Moisten collagen with saline or hydrogel Secondary Dressing: Woven Gauze Sponge, Non-Sterile 4x4 in (Generic) 1 x Per Day/30 Days Discharge Instructions: Apply over primary dressing as directed. Secured With: 19M Medipore H Soft Cloth Surgical T ape, 4 x 10 (in/yd) (Generic) 1 x Per Day/30 Days Discharge Instructions: Secure with tape as directed. WOUND #14: - T Third Wound Laterality: Left oe Cleanser: Soap and Water 1 x Per Day/30 Days Discharge Instructions: May shower and wash wound with dial antibacterial soap and water prior to dressing change. Cleanser: Wound Cleanser 1 x Per Day/30 Days Discharge Instructions: Cleanse the wound with wound cleanser prior to applying a clean dressing using gauze sponges, not tissue or cotton balls. Prim Dressing: Promogran Prisma Matrix, 4.34 (sq in) (silver collagen) (Generic) 1 x Per Day/30 Days ary Discharge Instructions: Moisten collagen with saline or hydrogel Secondary Dressing: Woven Gauze Sponge,  Non-Sterile 4x4 in (Generic) 1 x Per Day/30 Days Discharge Instructions: Apply over primary dressing as directed. Secured With: 19M Medipore H Soft Cloth Surgical T ape, 4 x 10 (in/yd) (Generic) 1 x Per Day/30 Days Discharge Instructions: Secure with tape as directed. WOUND #15: - T Great Wound Laterality: Dorsal, Right oe Cleanser: Soap and Water 1 x Per Day/30 Days Discharge Instructions: May shower and wash wound with dial antibacterial soap and water prior to dressing change. Cleanser: Wound Cleanser 1 x Per Day/30 Days Discharge Instructions: Cleanse the wound with wound cleanser prior to applying a clean dressing using gauze sponges, not tissue or cotton balls. Prim Dressing: Promogran Prisma Matrix, 4.34 (sq in) (silver collagen) (Generic) 1 x Per Day/30 Days ary Discharge Instructions: Moisten collagen with saline or hydrogel Secondary Dressing: Woven Gauze Sponge, Non-Sterile 4x4 in (Generic) 1 x Per Day/30 Days Discharge Instructions: Apply over primary dressing as directed. Secured With: 19M Medipore H Soft Cloth Surgical T ape, 4 x 10 (in/yd) (Generic) 1 x Per Day/30 Days Discharge Instructions: Secure with tape as directed. WOUND #9: - Calcaneus Wound Laterality: Left Cleanser: Soap and Water 1 x Per Day/30 Days Discharge Instructions: May shower and wash wound with dial antibacterial soap and water prior to dressing change. Cleanser: Wound Cleanser 1 x Per Day/30 Days Discharge Instructions: Cleanse the wound with wound cleanser prior to applying a clean dressing using gauze  sponges, not tissue or cotton balls. Prim Dressing: Promogran Prisma Matrix, 4.34 (sq in) (silver collagen) (Generic) 1 x Per Day/30 Days ary Discharge Instructions: Moisten collagen with saline or hydrogel Secondary Dressing: ALLEVYN Heel 4 1/2in x 5 1/2in / 10.5cm x 13.5cm (Generic) 1 x Per Day/30 Days Discharge Instructions: Apply over primary dressing as directed. Secondary Dressing: Woven Gauze Sponge,  Non-Sterile 4x4 in (Generic) 1 x Per Day/30 Days Discharge Instructions: Apply over primary dressing as directed. Secured With: 22M Medipore H Soft Cloth Surgical T ape, 4 x 10 (in/yd) (Generic) 1 x Per Day/30 Days Discharge Instructions: Secure with tape as directed. 04/30/2023: All of the wounds are stable with the exception of the left dorsal great toe wound which now has tendon exposed in addition to bone. There is slough and eschar accumulation on all sites. I used a curette to debride slough and eschar from all of the wound sites. I also debrided subcutaneous tissue from the heel wound. Will continue Prisma silver collagen to all locations. Continue to float the heel while sitting or in bed. Follow-up in 1 week. Electronic Signature(s) Signed: 04/30/2023 4:02:11 PM By: Megan Guess MD FACS Entered By: [name] on 04/30/2023 16:02:10 [name], [name] ([id number]) [id number] Page 17 of 19 -------------------------------------------------------------------------------- HxROS Details Patient Name: Date of Service: MONIQUEA, STITELER 04/30/2023 2:30 PM Medical Record Number: [id number] Patient Account Number: [id number] Date of Birth/Sex: Treating RN: 10/19/1927 (89 y.o. F) Primary Care Provider: Jorge [name] Other Clinician: Referring Provider: Treating Provider/Extender: [name] in Treatment: 7 Information Obtained From Patient Caregiver Chart Eyes Medical History: Positive for: Cataracts Hematologic/Lymphatic Medical History: Positive for: Anemia Past Medical History Notes: Thrombocytopenia, Anticoagulant long-term use Respiratory Medical History: Negative for: Chronic Obstructive Pulmonary Disease (COPD) Past Medical History Notes: Pulmonary embolus Cardiovascular Medical History: Positive for: Arrhythmia - a-fib; Congestive Heart Failure; Hypotension; Peripheral Venous Disease Gastrointestinal Medical  History: Past Medical History Notes: Hiatal hernia Endocrine Medical History: Past Medical History Notes: Hyperthyroidism, Hypothyroidism Genitourinary Medical History: Past Medical History Notes: CKD stage III Musculoskeletal Medical History: Positive for: 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 Past Medical History Notes: arthritis Neurologic Medical History: Positive for: Neuropathy HBO Extended History Items Eyes: Cataracts Immunizations Buchinger, [name] ([id number]) [id number] Page 18 of 19 Pneumococcal Vaccine: Received Pneumococcal Vaccination: No Implantable Devices None Hospitalization / Surgery History Type of Hospitalization/Surgery Abdominal aortogram w/lower extremity Amputation toe (Right) Colonoscopy Elbow surgery Laparoscopic hysterectomy Family and Social History Cancer: Yes - Siblings; Diabetes: No; Heart Disease: Yes; Stroke: Yes - Mother; Former smoker - quit 2003; Marital Status - Widowed; Alcohol Use: Never; Drug Use: No History; Caffeine Use: Never; Financial Concerns: No; Food, Clothing or Shelter Needs: No; Support System Lacking: No; Transportation Concerns: No Electronic Signature(s) Signed: 04/30/2023 4:10:53 PM By: Megan Guess MD FACS Entered By: [name] on 04/30/2023 15:57:10 -------------------------------------------------------------------------------- SuperBill Details Patient Name: Date of Service: Megan Reap 04/30/2023 Medical Record Number: [id number] Patient Account Number: [id number] Date of Birth/Sex: Treating RN: April 05, 1928 (89 y.o. [name] Primary Care Provider: Jorge [name] Other Clinician: Referring Provider: Treating Provider/Extender: [name] in Treatment: 7 Diagnosis Coding ICD-10 Codes Code Description [phone] Pressure ulcer of left heel, stage 3 L97.516 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  involvement without evidence of necrosis 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 without evidence of necrosis L97.522 Non-pressure chronic ulcer of other part of left foot  with fat layer exposed I73.9 Peripheral vascular disease, unspecified I50.32 Chronic diastolic (congestive) heart failure N18.32 Chronic kidney disease, stage 3b I87.2 Venous insufficiency (chronic) (peripheral) Facility Procedures : CPT4 Code: 16109604 Description: 11042 - DEB SUBQ TISSUE 20 SQ CM/< ICD-10 Diagnosis Description L89.623 Pressure ulcer of left heel, stage 3 Modifier: Quantity: 1 : CPT4 Code: [id number] Description: 97597 - DEBRIDE WOUND 1ST 20 SQ CM OR < ICD-10 Diagnosis Description 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 with L97.526 Non-pressure chronic ulcer of other part of left foot with bone  involvement witho L97.522 Non-pressure chronic ulcer of other part of left foot with fat layer exposed Modifier: out evidence of nec ut evidence of necr Quantity: 1 rosis osis Physician Procedures : CPT4 Code Description Modifier [id number] - WC PHYS LEVEL 3 - EST PT 25 ICD-10 Diagnosis Description Harshbarger, Crystin [name] ([id number]) [id number] [phone] Pressure ulcer of left heel, stage 3 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 without evidence of necrosis L97.522 Non-pressure chronic ulcer of other part of left foot  with fat layer exposed Quantity: 1 Page 19 of 19 : [id number] - WC PHYS SUBQ TISS 20 SQ CM 1 ICD-10 Diagnosis Description L89.623 Pressure ulcer of left heel, stage 3 Quantity: : [id number] - WC PHYS DEBR WO ANESTH 20 SQ CM 1 ICD-10 Diagnosis Description L97.516 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  involvement without evidence of necrosis L97.526 Non-pressure chronic ulcer of other part of left  foot with bone involvement without evidence of necrosis L97.522 Non-pressure chronic ulcer of other part of left foot with fat layer exposed Quantity: Electronic Signature(s) Signed: 04/30/2023 4:03:51 PM By: Megan Guess MD FACS Entered By: [name] on 04/30/2023 16:03:50

## 2023-05-05 NOTE — ED Triage Notes (Signed)
Pt has hx of afib, chf, presents with left leg pain , redness, swelling , warm, nontender. Pt has mulitple toes amputated bilateral , years ago that are dressed on arrival , pt has home health at home

## 2023-05-05 NOTE — Progress Notes (Signed)
Megan, [name] ([id number]) [id number] Page 1 of 19 Visit Report for 04/23/2023 Chief Complaint Document Details Patient Name: Date of Service: [name], [name]. 04/23/2023 2:30 PM Medical Record Number: [id number] Patient Account Number: [id number] Date of Birth/Sex: Treating RN: 1927-11-01 (89 y.o. F) Primary Care Provider: Jorge [name] Other Clinician: Referring Provider: Treating Provider/Extender: [name] in Treatment: 6 Information Obtained from: Patient Chief Complaint 01/28/2022; right second toe amputation site dehiscence and bilateral lower extremity wounds. 03/10/2023: pressure ulcers of right heel, ulcers on toes Electronic Signature(s) Signed: 04/23/2023 4:10:11 PM By: Megan Guess MD FACS Previous Signature: 04/23/2023 3:50:50 PM Version By: Megan Guess MD FACS Entered By: [name] on 04/23/2023 16:10:11 -------------------------------------------------------------------------------- Debridement Details Patient Name: Date of Service: [name]. 04/23/2023 2:30 PM Medical Record Number: [id number] Patient Account Number: [id number] Date of Birth/Sex: Treating RN: 20-Oct-1927 (89 y.o. [name] Primary Care Provider: Jorge [name] Other Clinician: Referring Provider: Treating Provider/Extender: [name] in Treatment: 6 Debridement Performed for Assessment: Wound #12 Left T Great oe Performed By: Physician Megan Guess, MD Debridement Type: Debridement Severity of Tissue Pre Debridement: Fat layer exposed Level of Consciousness (Pre-procedure): Awake and Alert Pre-procedure Verification/Time Out Yes - 15:55 Taken: Start Time: 15:56 Pain Control: Lidocaine 4% Topical Solution Percent of Wound Bed Debrided: 100% T Area Debrided (cm): otal 0.16 Tissue and other material debrided: Non-Viable, Eschar, Slough, Slough Level: Non-Viable Tissue Debridement  Description: Selective/Open Wound Instrument: Curette Bleeding: Minimum Hemostasis Achieved: Pressure End Time: 16:00 Procedural Pain: 0 Post Procedural Pain: 0 Response to Treatment: Procedure was tolerated well Level of Consciousness (Post- Awake and Alert procedure): Post Debridement Measurements of Total Wound Length: (cm) 0.4 Width: (cm) 0.5 Depth: (cm) 0.1 [name], [name] ([id number]) [id number] Page 2 of 19 Volume: (cm) 0.016 Character of Wound/Ulcer Post Debridement: Stable Severity of Tissue Post Debridement: Fat layer exposed Post Procedure Diagnosis Same as Pre-procedure Notes Scribed for Dr Lady Gary by Megan Grills RN Electronic Signature(s) Signed: 04/23/2023 4:15:46 PM By: Megan Guess MD FACS Signed: 05/05/2023 2:03:55 PM By: [name] Entered By: [name] on 04/23/2023 15:58:48 -------------------------------------------------------------------------------- Debridement Details Patient Name: Date of Service: [name]. 04/23/2023 2:30 PM Medical Record Number: [id number] Patient Account Number: [id number] Date of Birth/Sex: Treating RN: [birth year] (89 y.o. [name] Primary Care Provider: Jorge [name] Other Clinician: Referring Provider: Treating Provider/Extender: [name] in Treatment: 6 Debridement Performed for Assessment: Wound #13 Left,Dorsal T Great oe Performed By: Physician Megan Guess, MD Debridement Type: Debridement Severity of Tissue Pre Debridement: Fat layer exposed Level of Consciousness (Pre-procedure): Awake and Alert Pre-procedure Verification/Time Out Yes - 15:55 Taken: Start Time: 15:56 Pain Control: Lidocaine 4% Topical Solution Percent of Wound Bed Debrided: 100% T Area Debrided (cm): otal 0.47 Tissue and other material debrided: Non-Viable, Eschar, Slough, Slough Level: Non-Viable Tissue Debridement Description: Selective/Open  Wound Instrument: Curette Bleeding: Minimum Hemostasis Achieved: Pressure End Time: 16:00 Procedural Pain: 0 Post Procedural Pain: 0 Response to Treatment: Procedure was tolerated well Level of Consciousness (Post- Awake and Alert procedure): Post Debridement Measurements of Total Wound Length: (cm) 0.6 Width: (cm) 1 Depth: (cm) 0.1 Volume: (cm) 0.047 Character of Wound/Ulcer Post Debridement: Stable Severity of Tissue Post Debridement: Fat layer exposed Post Procedure Diagnosis Same as Pre-procedure Notes Scribed for Dr Lady Gary by Megan Grills RN Electronic Signature(s) Signed: 04/23/2023 4:15:46 PM By: Megan Guess MD FACS Signed: 05/05/2023 2:03:55 PM By: [name] Entered By: [name] on  04/23/2023 15:59:01 [name], [name] ([id number]) [id number] Page 3 of 19 -------------------------------------------------------------------------------- Debridement Details Patient Name: Date of Service: [name], [name] 04/23/2023 2:30 PM Medical Record Number: [id number] Patient Account Number: [id number] Date of Birth/Sex: Treating RN: 1927/11/26 (89 y.o. [name] Primary Care Provider: Jorge [name] Other Clinician: Referring Provider: Treating Provider/Extender: [name] in Treatment: 6 Debridement Performed for Assessment: Wound #14 Left T Third oe Performed By: Physician Megan Guess, MD Debridement Type: Debridement Severity of Tissue Pre Debridement: Fat layer exposed Level of Consciousness (Pre-procedure): Awake and Alert Pre-procedure Verification/Time Out Yes - 15:55 Taken: Start Time: 15:56 Pain Control: Lidocaine 4% Topical Solution Percent of Wound Bed Debrided: 100% T Area Debrided (cm): otal 0.13 Tissue and other material debrided: Non-Viable, Eschar, Slough, Slough Level: Non-Viable Tissue Debridement Description: Selective/Open Wound Instrument: Curette Bleeding:  Minimum Hemostasis Achieved: Pressure End Time: 16:00 Procedural Pain: 0 Post Procedural Pain: 0 Response to Treatment: Procedure was tolerated well Level of Consciousness (Post- Awake and Alert procedure): Post Debridement Measurements of Total Wound Length: (cm) 0.4 Width: (cm) 0.4 Depth: (cm) 0.1 Volume: (cm) 0.013 Character of Wound/Ulcer Post Debridement: Stable Severity of Tissue Post Debridement: Fat layer exposed Post Procedure Diagnosis Same as Pre-procedure Notes Scribed for Dr Lady Gary by Megan Grills RN Electronic Signature(s) Signed: 04/23/2023 4:15:46 PM By: Megan Guess MD FACS Signed: 05/05/2023 2:03:55 PM By: [name] Entered By: [name] on 04/23/2023 15:59:49 -------------------------------------------------------------------------------- Debridement Details Patient Name: Date of Service: [name]. 04/23/2023 2:30 PM Medical Record Number: [id number] Patient Account Number: [id number] Date of Birth/Sex: Treating RN: 1927/09/15 (89 y.o. [name] Primary Care Provider: Jorge [name] Other Clinician: Referring Provider: Treating Provider/Extender: [name] in Treatment: 6 Debridement Performed for Assessment: Wound #11 Right T Third JAYLEANNA, PRUITT ([id number]) [id number] Page 4 of 19 Performed By: Physician Megan Guess, MD Debridement Type: Debridement Level of Consciousness (Pre-procedure): Awake and Alert Pre-procedure Verification/Time Out Yes - 15:55 Taken: Start Time: 15:56 Pain Control: Lidocaine 4% Topical Solution Percent of Wound Bed Debrided: 100% T Area Debrided (cm): otal 0.5 Tissue and other material debrided: Non-Viable, Eschar, Slough, Slough Level: Non-Viable Tissue Debridement Description: Selective/Open Wound Instrument: Curette Bleeding: Minimum Hemostasis Achieved: Pressure End Time: 16:00 Procedural Pain: 0 Post Procedural Pain:  0 Response to Treatment: Procedure was tolerated well Level of Consciousness (Post- Awake and Alert procedure): Post Debridement Measurements of Total Wound Length: (cm) 0.8 Stage: Category/Stage IV Width: (cm) 0.8 Depth: (cm) 0.1 Volume: (cm) 0.05 Character of Wound/Ulcer Post Debridement: Stable Post Procedure Diagnosis Same as Pre-procedure Notes Scribed for Dr Lady Gary by Megan Grills RN Electronic Signature(s) Signed: 04/23/2023 4:15:46 PM By: Megan Guess MD FACS Signed: 05/05/2023 2:03:55 PM By: [name] Entered By: [name] on 04/23/2023 16:00:31 -------------------------------------------------------------------------------- Debridement Details Patient Name: Date of Service: [name]. 04/23/2023 2:30 PM Medical Record Number: [id number] Patient Account Number: [id number] Date of Birth/Sex: Treating RN: [birth year] (89 y.o. [name] Primary Care Provider: Jorge [name] Other Clinician: Referring Provider: Treating Provider/Extender: [name] in Treatment: 6 Debridement Performed for Assessment: Wound #15 Right,Dorsal T Great oe Performed By: Physician Megan Guess, MD Debridement Type: Debridement Severity of Tissue Pre Debridement: Fat layer exposed Level of Consciousness (Pre-procedure): Awake and Alert Pre-procedure Verification/Time Out Yes - 15:55 Taken: Start Time: 15:56 Pain Control: Lidocaine 4% Topical Solution Percent of Wound Bed Debrided: 100% T Area Debrided (cm): otal 0.24 Tissue and other material debrided: Non-Viable,  Eschar, Slough, Slough Level: Non-Viable Tissue Debridement Description: Selective/Open Wound Instrument: Curette Bleeding: Minimum Hemostasis Achieved: Pressure End Time: 16:00 Procedural Pain: 0 Post Procedural Pain: 0 [name], [name] ([id number]) [id number] Page 5 of 19 Response to Treatment: Procedure was tolerated well Level of  Consciousness (Post- Awake and Alert procedure): Post Debridement Measurements of Total Wound Length: (cm) 0.5 Width: (cm) 0.6 Depth: (cm) 0.1 Volume: (cm) 0.024 Character of Wound/Ulcer Post Debridement: Stable Severity of Tissue Post Debridement: Fat layer exposed Post Procedure Diagnosis Same as Pre-procedure Notes Scribed for Dr Lady Gary by Megan Grills RN Electronic Signature(s) Signed: 04/23/2023 4:15:46 PM By: Megan Guess MD FACS Signed: 05/05/2023 2:03:55 PM By: [name] Entered By: [name] on 04/23/2023 16:03:33 -------------------------------------------------------------------------------- Debridement Details Patient Name: Date of Service: [name]. 04/23/2023 2:30 PM Medical Record Number: [id number] Patient Account Number: [id number] Date of Birth/Sex: Treating RN: [birth year] (89 y.o. F) Primary Care Provider: Jorge [name] Other Clinician: Referring Provider: Treating Provider/Extender: [name] in Treatment: 6 Debridement Performed for Assessment: Wound #9 Left Calcaneus Performed By: Physician Megan Guess, MD Debridement Type: Debridement Level of Consciousness (Pre-procedure): Awake and Alert Pre-procedure Verification/Time Out Yes - 15:55 Taken: Start Time: 15:56 Pain Control: Lidocaine 4% Topical Solution Percent of Wound Bed Debrided: 100% T Area Debrided (cm): otal 0.33 Tissue and other material debrided: Non-Viable, Eschar, Slough, Subcutaneous, Slough Level: Skin/Subcutaneous Tissue Debridement Description: Excisional Instrument: Curette Bleeding: Minimum Hemostasis Achieved: Pressure End Time: 16:00 Procedural Pain: 0 Post Procedural Pain: 0 Response to Treatment: Procedure was tolerated well Level of Consciousness (Post- Awake and Alert procedure): Post Debridement Measurements of Total Wound Length: (cm) 0.7 Stage: Category/Stage III Width: (cm) 0.6 Depth: (cm) 0.1 Volume: (cm)  0.033 Character of Wound/Ulcer Post Debridement: Stable Post Procedure Diagnosis Same as Pre-procedure Notes Scribed for Dr Lady Gary by Megan Grills RN [name], [name] ([id number]) [phone].pdf Page 6 of 19 Electronic Signature(s) Signed: 04/23/2023 4:06:41 PM By: Megan Guess MD FACS Entered By: [name] on 04/23/2023 16:06:41 -------------------------------------------------------------------------------- Debridement Details Patient Name: Date of Service: [name]. 04/23/2023 2:30 PM Medical Record Number: [id number] Patient Account Number: [id number] Date of Birth/Sex: Treating RN: [birth year] (89 y.o. F) Primary Care Provider: Jorge [name] Other Clinician: Referring Provider: Treating Provider/Extender: [name] in Treatment: 6 Debridement Performed for Assessment: Wound #10 Left T Second oe Performed By: Physician Megan Guess, MD Debridement Type: Debridement Level of Consciousness (Pre-procedure): Awake and Alert Pre-procedure Verification/Time Out Yes - 15:55 Taken: Start Time: 15:56 Pain Control: Lidocaine 4% Topical Solution Percent of Wound Bed Debrided: 100% T Area Debrided (cm): otal 0.27 Tissue and other material debrided: Non-Viable, Eschar, Slough, Slough Level: Non-Viable Tissue Debridement Description: Selective/Open Wound Instrument: Curette Bleeding: Minimum Hemostasis Achieved: Pressure End Time: 16:00 Procedural Pain: 0 Post Procedural Pain: 0 Response to Treatment: Procedure was tolerated well Level of Consciousness (Post- Awake and Alert procedure): Post Debridement Measurements of Total Wound Length: (cm) 0.5 Stage: Category/Stage IV Width: (cm) 0.7 Depth: (cm) 0.1 Volume: (cm) 0.027 Character of Wound/Ulcer Post Debridement: Stable Post Procedure Diagnosis Same as Pre-procedure Notes Scribed for Dr Lady Gary by Megan Grills RN Electronic Signature(s) Signed:  04/23/2023 4:06:56 PM By: Megan Guess MD FACS Entered By: [name] on 04/23/2023 16:06:56 -------------------------------------------------------------------------------- HPI Details Patient Name: Date of Service: [name]. 04/23/2023 2:30 PM Medical Record Number: [id number] Patient Account Number: [id number] Date of Birth/Sex: Treating RN: [birth year] (89 y.o. F) Primary Care Provider: Jorge [name] Other Clinician: Blanchet, Naliyah  Judie Petit ([id number]) [id number] Page 7 of 19 Referring Provider: Treating Provider/Extender: [name] in Treatment: 6 History of Present Illness HPI Description: Admission 01/28/2022 Ms. 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 She states she has tried Medihoney and silver alginate in the past to this area with little benefit. She also has 2 small areas limited to skin breakdown to her lower extremities bilaterally. She has chronic venous insufficiency but not has not been wearing her compression stockings. She states she bumped her legs against an object and not so the wound started. She has been using Medihoney to the sites. She denies signs of infection. 6/2; patient presents for follow-up. She had an x-ray of her right foot done at last clinic visit and this was negative for evidence of osteomyelitis. She also had a wound culture done that showed extra high levels of Staph aureus. I recommended Keystone antibiotics for this and this was ordered. She had ABIs with TBI's done as well that showed monophasic waveforms to the right foot with TBI of 0 and an ABI of 0.52. Urgent referral was made to vein and vascular and she  saw Dr. Durwin Nora on 6/1, yesterday and he recommended an arteriogram. This is scheduled for 6/16. Patient also reports a new wound to the right great toe. This is a blister that has ruptured. She also reports increased erythema to the toe. 6/6; the patient was worked in urgently today at the insistence of her daughter out of concern for a new wound on the lateral part of the plantar right great toe. She has her original postsurgical wound after the amputation of the right second toe, she has a wound on the medial part of the right great toe. The patient is apparently going for an angiogram by Dr. Durwin Nora in 2 [name] time. 6/13; patient presents for follow-up. She has been using bacitracin to the abrasion on the right great toe. She has been using collagen and Keystone antibiotic to the amputation site. She has no issues or complaints today. She denies signs of infection. 6/22; patient presents for follow-up. She states that her abdominal aortogram was canceled due to her renal function. She has been using Keystone antibiotics to the amputation site and Medihoney to the right great toe wound. At the pace of the right great toe she has a slitlike open area that she thinks was caused by the tape from the dressing. 6/29; patient presents for follow-up. She has been using Keystone antibiotics to the amputation site along with collagen. She has been using Medihoney to the right great toe wound. She has no other wounds. She denies signs of infection. 7/13; patient presents for follow-up. She has been using Keystone antibiotics and collagen to the wound sites. She currently denies signs of infection. She states she is scheduled to see me nephrology next month. 7/20; patient presents for follow-up. She continue Keystone antibiotics and collagen to the wound sites. She has no issues or complaints today. 8/1; patient presents for follow up. She continues to use keystone antibiotics and collagen to the wound sites. She  has no issues or complaints today. 8/15; patient presents for follow-up. She has been using Keystone antibiotics and collagen to the wound sites. She  followed up with her nephrologist who made medication changes. She is supposed to get a repeat BMP in 2 [name]. Her decrease in renal function was a limiting factor in obtaining an arteriogram for potential intervention for revascularization. She currently denies signs of infection. 9/2; patient presents for follow-up. She has been using Keystone antibiotic and collagen to the wound beds. She has no issues or complaints today. Reports there has been improvement in kidney function however not cleared to have her arteriogram just yet. 10/10; patient presents for follow-up. She has been using Keystone antibiotic and collagen to the wound beds. She reports 2 new wounds 1 to the anterior right lower extremity and another to the plantar aspect of the right foot. She states that the right plantar foot wound was caused by the home health nurse changing the dressing and causing a skin tear. She is not sure how the right anterior leg wound started. It appears to be from trauma. She denies signs of infection. 10/19; patient presents for follow-up. She has been using Keystone antibiotic and collagen to the right great toe wound. She is been using silver alginate to the right anterior and right plantar foot wound. She has been using Tubigrip to the right lower extremity. The plantar foot wound has healed. She has no issues or complaints today. 11/3; since the patient was last here she was seen in urgent care apparently for an area on the dorsal aspect of the right fifth toe perhaps over the PIP. I saw a picture of this on the daughter's cell phone. There was slough on this. Urgent care gave him doxycycline. She is also changed the dressing to all wounds back the [facility] and collagen which includes her right leg and left first toe 11/16; patient presents for  follow-up. She has a new wound to the left knee. She states she fell. She has been using antibiotic ointment and collagen to this area. She has been using collagen and Keystone antibiotic to the right great toe wound. The anterior right leg wound is healed. She denies signs of infection. 11/30; patient presents for follow-up. The right great toe wound has healed. She has 1 remaining wound to the left knee. She has been using Hydrofera Blue and Medihoney here. 12/19; patient presents for follow-up. Her left knee wound has healed. She has no issues or complaints today. 09/30/2022 Patient's daughter called for an appointment due to increased swelling to her lower extremities bilaterally. Today patient presents with increased swelling to her lower extremities although there is no increased redness or warmth. She was evaluated by her nephrologist who increased her diuretics. Per patient and daughter her swelling has gone down to the leg Over the past several days. She does not have any open wounds. She was advised to elevate her legs and not consume excess salt By her PCP and nephrologist. Patient has compression stockings that she has been using sporadically. READMISSION 03/10/2023 Since her last visit to the clinic, the patient has been hospitalized at least twice, in February with COVID pneumonia and CHF exacerbation. She was discharged to a skilled nursing facility and then readmitted in April with fevers. She was noted to have pressure ulcers on her heels and sacrum. Per family declined skilled nursing placement upon discharge and she has apparently been residing at home. She has followed with the vascular surgery clinic regarding peripheral artery disease and due to ulcerations on her feet, she ultimately underwent arteriography with balloon angioplasties of the superficial femoral artery, popliteal artery  and peroneal artery on the left, along with shockwave ultrasound assisted balloon angioplasty of  the popliteal artery and distal SFA. Given her multiple significant medical comorbidities, she is not a candidate for open surgery. She is deemed to be maximally vascularized this procedure, which was performed on 04 March 2023. T oday, she has a stage III pressure ulcer on her left heel and wounds on the PIP joints of her right third and left second toes. No sacral wound is present and the ulcer on her left heel has closed. 03/18/2023: She has a new ulcer on her left great toe. It was just noticed yesterday and the etiology is unknown. The fat layer is exposed and there is some slough accumulation. The other wounds are all essentially unchanged in size. There is a little bit more granulation tissue on the other toe wounds, but bone is still frankly exposed on both sides. The heel ulcer has thick slough accumulation. 03/26/2023: She has a new wound on the tip of her left third toe. It is black. Neither the patient nor her daughter are aware of how it began. The other wounds are PASTY, LIPFORD ([id number]) [id number] Page 8 of 19 stable. 03/31/2023: The culture that I took from her left third toe returned with MRSA. We contacted the patient's daughter to have her stop levofloxacin and start doxycycline, but she has not yet picked up the new antibiotic. All of her wounds have accumulated slough. Bone remains exposed. She is scheduled to follow- up with vascular surgery tomorrow to evaluate the patency of her revascularization. 04/09/2023: She saw her vascular surgeon and her ABIs are improved; her TBI's could not be checked due to her bandages. She did finally start taking the prescribed doxycycline. All of her wounds look about the same today. 04/23/2023: No significant changes to any of her wounds aside from being slightly smaller other than the left dorsal great toe wound, which is stable. She has accumulated slough and eschar on all of the surfaces. Electronic  Signature(s) Signed: 04/23/2023 4:11:06 PM By: Megan Guess MD FACS Previous Signature: 04/23/2023 3:51:14 PM Version By: Megan Guess MD FACS Entered By: [name] on 04/23/2023 16:11:06 -------------------------------------------------------------------------------- Physical Exam Details Patient Name: Date of Service: [name]. 04/23/2023 2:30 PM Medical Record Number: [id number] Patient Account Number: [id number] Date of Birth/Sex: Treating RN: 14-Jan-1928 (89 y.o. F) Primary Care Provider: Jorge [name] Other Clinician: Referring Provider: Treating Provider/Extender: [name] in Treatment: 6 Constitutional Hypertensive, asymptomatic. . . . no acute distress. Respiratory Normal work of breathing on supplemental oxygen. Notes 04/23/2023: No significant changes to any of her wounds aside from being slightly smaller, other than the left dorsal great toe wound, which is stable. She has accumulated slough and eschar on all of the surfaces. Electronic Signature(s) Signed: 04/23/2023 4:11:50 PM By: Megan Guess MD FACS Entered By: [name] on 04/23/2023 16:11:49 -------------------------------------------------------------------------------- Physician Orders Details Patient Name: Date of Service: [name]. 04/23/2023 2:30 PM Medical Record Number: [id number] Patient Account Number: [id number] Date of Birth/Sex: Treating RN: [birth year] (89 y.o. 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 - Dr. Lady Gary - room 1 Return A Anesthetic (In clinic) Topical Lidocaine 4% applied to wound bed Bathing/ Shower/ Hygiene May shower and wash wound with soap and water. Edema Control - Lymphedema / SCD / Other Elevate legs to the level of the heart or above for 30 minutes daily and/or when sitting for 3-4 times a day throughout the day. Avoid standing for long periods of time. Patient to wear own compression stockings every day. Additional Orders / Instructions Follow Nutritious Diet - add in protein shakes every day to diet - recommend premier protein 500 mg x3 a day vitamin C, zinc 30-50 mg per day Home Health No change in wound care orders this week; continue Home Health for wound care. May utilize formulary equivalent dressing for wound treatment orders unless otherwise specified. Other Home Health Orders/Instructions: - Medi Home Wound Treatment Wound #10 - T Second oe Wound Laterality: Left Cleanser: Soap and Water 1 x Per Day/30 Days Discharge Instructions: May shower and wash wound with dial antibacterial soap and water prior to dressing change. Cleanser: Wound Cleanser 1 x Per Day/30 Days Discharge Instructions: Cleanse the wound with wound cleanser prior to applying a clean dressing using gauze sponges, not tissue or cotton balls. Prim Dressing: Promogran Prisma Matrix, 4.34 (sq in) (silver collagen) (Generic) 1 x Per Day/30 Days ary Discharge Instructions: Moisten collagen with saline or hydrogel Secondary Dressing: Woven Gauze Sponge,  Non-Sterile 4x4 in (Generic) 1 x Per Day/30 Days Discharge Instructions: Apply over primary dressing as directed. Secured With: 17M Medipore H Soft Cloth Surgical T ape, 4 x 10 (in/yd) (Generic) 1 x Per Day/30 Days Discharge Instructions: Secure with tape as directed. Wound #11 - T Third oe Wound Laterality: Right Cleanser: Soap and Water 1 x Per Day/30 Days Discharge Instructions: May shower and wash wound with dial antibacterial soap and water prior to dressing change. Cleanser: Wound Cleanser 1 x Per Day/30 Days Discharge Instructions: Cleanse the wound with wound cleanser prior to applying a clean dressing using gauze sponges, not tissue or cotton balls. Prim Dressing: Promogran Prisma Matrix, 4.34 (sq in) (silver collagen) (Generic) 1 x Per Day/30 Days ary Discharge Instructions: Moisten collagen with saline or hydrogel Secondary Dressing: Woven Gauze Sponge, Non-Sterile 4x4 in (Generic) 1 x Per Day/30 Days Discharge Instructions: Apply over primary dressing as directed. Secured With: 17M Medipore H Soft Cloth Surgical T ape, 4 x 10 (in/yd) (Generic) 1 x Per Day/30 Days Discharge Instructions: Secure with tape as directed. Wound #12 - T Great oe Wound Laterality: Left Cleanser: Soap and Water 1 x Per Day/30 Days Discharge Instructions: May shower and wash wound with dial antibacterial soap and water prior to dressing change. Cleanser: Wound Cleanser 1 x Per Day/30 Days Discharge Instructions: Cleanse the wound with wound cleanser prior to applying a clean dressing using gauze sponges, not tissue or cotton balls. Prim Dressing: Promogran Prisma Matrix, 4.34 (sq in) (silver collagen) (Generic) 1 x Per Day/30 Days ary Discharge Instructions: Moisten collagen with saline or hydrogel Secondary Dressing: Woven Gauze Sponge, Non-Sterile 4x4 in (Generic) 1 x Per Day/30 Days Discharge Instructions: Apply over primary dressing as directed. Secured With: 17M Medipore H Soft  Cloth Surgical T ape, 4  x 10 (in/yd) (Generic) 1 x Per Day/30 Days Discharge Instructions: Secure with tape as directed. ISAMARI, CASADAY ([id number]) [id number] Page 10 of 19 Wound #13 - T Great oe Wound Laterality: Dorsal, Left Cleanser: Soap and Water 1 x Per Day/30 Days Discharge Instructions: May shower and wash wound with dial antibacterial soap and water prior to dressing change. Cleanser: Wound Cleanser 1 x Per Day/30 Days Discharge Instructions: Cleanse the wound with wound cleanser prior to applying a clean dressing using gauze sponges, not tissue or cotton balls. Prim Dressing: Promogran Prisma Matrix, 4.34 (sq in) (silver collagen) (Generic) 1 x Per Day/30 Days ary Discharge Instructions: Moisten collagen with saline or hydrogel Secondary Dressing: Woven Gauze Sponge, Non-Sterile 4x4 in (Generic) 1 x Per Day/30 Days Discharge Instructions: Apply over primary dressing as directed. Secured With: 5577M Medipore H Soft Cloth Surgical T ape, 4 x 10 (in/yd) (Generic) 1 x Per Day/30 Days Discharge Instructions: Secure with tape as directed. Wound #14 - T Third oe Wound Laterality: Left Cleanser: Soap and Water 1 x Per Day/30 Days Discharge Instructions: May shower and wash wound with dial antibacterial soap and water prior to dressing change. Cleanser: Wound Cleanser 1 x Per Day/30 Days Discharge Instructions: Cleanse the wound with wound cleanser prior to applying a clean dressing using gauze sponges, not tissue or cotton balls. Prim Dressing: Promogran Prisma Matrix, 4.34 (sq in) (silver collagen) (Generic) 1 x Per Day/30 Days ary Discharge Instructions: Moisten collagen with saline or hydrogel Secondary Dressing: Woven Gauze Sponge, Non-Sterile 4x4 in (Generic) 1 x Per Day/30 Days Discharge Instructions: Apply over primary dressing as directed. Secured With: 5577M Medipore H Soft Cloth Surgical T ape, 4 x 10 (in/yd) (Generic) 1 x Per Day/30 Days Discharge Instructions: Secure with  tape as directed. Wound #15 - T Great oe Wound Laterality: Dorsal, Right Cleanser: Soap and Water 1 x Per Day/30 Days Discharge Instructions: May shower and wash wound with dial antibacterial soap and water prior to dressing change. Cleanser: Wound Cleanser 1 x Per Day/30 Days Discharge Instructions: Cleanse the wound with wound cleanser prior to applying a clean dressing using gauze sponges, not tissue or cotton balls. Prim Dressing: Promogran Prisma Matrix, 4.34 (sq in) (silver collagen) (Generic) 1 x Per Day/30 Days ary Discharge Instructions: Moisten collagen with saline or hydrogel Secondary Dressing: Woven Gauze Sponge, Non-Sterile 4x4 in (Generic) 1 x Per Day/30 Days Discharge Instructions: Apply over primary dressing as directed. Secured With: 5577M Medipore H Soft Cloth Surgical T ape, 4 x 10 (in/yd) (Generic) 1 x Per Day/30 Days Discharge Instructions: Secure with tape as directed. Wound #9 - Calcaneus Wound Laterality: Left Cleanser: Soap and Water 1 x Per Day/30 Days Discharge Instructions: May shower and wash wound with dial antibacterial soap and water prior to dressing change. Cleanser: Wound Cleanser 1 x Per Day/30 Days Discharge Instructions: Cleanse the wound with wound cleanser prior to applying a clean dressing using gauze sponges, not tissue or cotton balls. Prim Dressing: Promogran Prisma Matrix, 4.34 (sq in) (silver collagen) (Generic) 1 x Per Day/30 Days ary Discharge Instructions: Moisten collagen with saline or hydrogel Secondary Dressing: ALLEVYN Heel 4 1/2in x 5 1/2in / 10.5cm x 13.5cm (Generic) 1 x Per Day/30 Days Discharge Instructions: Apply over primary dressing as directed. Secondary Dressing: Woven Gauze Sponge, Non-Sterile 4x4 in (Generic) 1 x Per Day/30 Days Discharge Instructions: Apply over primary dressing as directed. Secured With: 5577M Medipore H Soft Cloth Surgical T ape, 4 x  10 (in/yd) (Generic) 1 x Per Day/30 Days Discharge Instructions: Secure with  tape as directed. Electronic Signature(s) Signed: 04/23/2023 4:15:46 PM By: Megan Guess MD FACS Entered By: [name] on 04/23/2023 16:12:44 [name], [name] ([id number]) [id number] Page 11 of 19 -------------------------------------------------------------------------------- Problem List Details Patient Name: Date of Service: ASHIYA, BRUTUS 04/23/2023 2:30 PM Medical Record Number: [id number] Patient Account Number: [id number] Date of Birth/Sex: Treating RN: [birth year] (89 y.o. 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 03/10/2023 No Yes without evidence of necrosis 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 03/10/2023 No Yes without evidence of necrosis L97.522 Non-pressure chronic ulcer of other part of left foot with fat layer exposed 03/18/2023 No Yes I73.9 Peripheral vascular disease, unspecified 03/10/2023 No Yes I50.32 Chronic diastolic (congestive) heart failure 03/10/2023 No Yes N18.32 Chronic kidney disease, stage 3b 03/10/2023 No Yes I87.2 Venous insufficiency (chronic) (peripheral) 03/10/2023 No Yes Inactive Problems Resolved Problems Electronic Signature(s) Signed: 04/23/2023 4:05:52 PM By: Megan Guess MD FACS Previous Signature: 04/23/2023 3:50:12 PM Version By: Megan Guess MD FACS Entered By: [name] on 04/23/2023 16:05:52 Progress Note Details -------------------------------------------------------------------------------- Modena Morrow ([id number]) [id number] Page 12 of 19 Patient Name: Date of  Service: LOVETTE, PUCCI 04/23/2023 2:30 PM Medical Record Number: [id number] Patient Account Number: [id number] Date of Birth/Sex: Treating RN: 1927-09-29 (89 y.o. F) Primary Care Provider: Jorge [name] Other Clinician: Referring Provider: Treating Provider/Extender: [name] in Treatment: 6 Subjective Chief Complaint Information obtained from Patient 01/28/2022; right second toe amputation site dehiscence and bilateral lower extremity wounds. 03/10/2023: pressure ulcers of right heel, ulcers on toes History of Present Illness (HPI) Admission 01/28/2022 Ms. 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 She states she has tried Medihoney and silver alginate in the past to this area with little benefit. She also has 2 small areas limited to skin breakdown to her lower extremities bilaterally. She has chronic venous insufficiency but not has not been wearing her compression stockings. She states she bumped her legs against an object and not so the wound started. She has been using Medihoney to the sites. She denies signs of infection. 6/2; patient presents for follow-up. She had an x-ray of her right foot done at last clinic visit and this was negative for evidence of osteomyelitis. She also had a wound culture done that showed extra high levels of Staph aureus. I recommended Keystone antibiotics for this and this was ordered. She had ABIs with TBI's done as well that showed monophasic waveforms to the right foot with TBI of 0 and an ABI of 0.52. Urgent referral was made to vein and vascular and she saw Dr. Durwin Nora on 6/1, yesterday and he recommended an arteriogram. This is scheduled for 6/16.  Patient also reports a new wound to the right great toe. This is a blister that has ruptured. She also reports increased erythema to the toe. 6/6; the patient  was worked in urgently today at the insistence of her daughter out of concern for a new wound on the lateral part of the plantar right great toe. She has her original postsurgical wound after the amputation of the right second toe, she has a wound on the medial part of the right great toe. The patient is apparently going for an angiogram by Dr. Durwin Nora in 2 [name] time. 6/13; patient presents for follow-up. She has been using bacitracin to the abrasion on the right great toe. She has been using collagen and Keystone antibiotic to the amputation site. She has no issues or complaints today. She denies signs of infection. 6/22; patient presents for follow-up. She states that her abdominal aortogram was canceled due to her renal function. She has been using Keystone antibiotics to the amputation site and Medihoney to the right great toe wound. At the pace of the right great toe she has a slitlike open area that she thinks was caused by the tape from the dressing. 6/29; patient presents for follow-up. She has been using Keystone antibiotics to the amputation site along with collagen. She has been using Medihoney to the right great toe wound. She has no other wounds. She denies signs of infection. 7/13; patient presents for follow-up. She has been using Keystone antibiotics and collagen to the wound sites. She currently denies signs of infection. She states she is scheduled to see me nephrology next month. 7/20; patient presents for follow-up. She continue Keystone antibiotics and collagen to the wound sites. She has no issues or complaints today. 8/1; patient presents for follow up. She continues to use keystone antibiotics and collagen to the wound sites. She has no issues or complaints today. 8/15; patient presents for follow-up. She has been using  Keystone antibiotics and collagen to the wound sites. She followed up with her nephrologist who made medication changes. She is supposed to get a repeat BMP in 2 [name]. Her decrease in renal function was a limiting factor in obtaining an arteriogram for potential intervention for revascularization. She currently denies signs of infection. 9/2; patient presents for follow-up. She has been using Keystone antibiotic and collagen to the wound beds. She has no issues or complaints today. Reports there has been improvement in kidney function however not cleared to have her arteriogram just yet. 10/10; patient presents for follow-up. She has been using Keystone antibiotic and collagen to the wound beds. She reports 2 new wounds 1 to the anterior right lower extremity and another to the plantar aspect of the right foot. She states that the right plantar foot wound was caused by the home health nurse changing the dressing and causing a skin tear. She is not sure how the right anterior leg wound started. It appears to be from trauma. She denies signs of infection. 10/19; patient presents for follow-up. She has been using Keystone antibiotic and collagen to the right great toe wound. She is been using silver alginate to the right anterior and right plantar foot wound. She has been using Tubigrip to the right lower extremity. The plantar foot wound has healed. She has no issues or complaints today. 11/3; since the patient was last here she was seen in urgent care apparently for an area on the dorsal aspect of the right fifth toe perhaps over the PIP. I saw a picture of this on the daughter's cell phone. There was slough on this. Urgent care gave him doxycycline. She is also changed the dressing to all wounds back the  Keystone and collagen which includes her right leg and left first toe 11/16; patient presents for follow-up. She has a new wound to the left knee. She states she fell. She has been using antibiotic  ointment and collagen to this area. She has been using collagen and Keystone antibiotic to the right great toe wound. The anterior right leg wound is healed. She denies signs of infection. 11/30; patient presents for follow-up. The right great toe wound has healed. She has 1 remaining wound to the left knee. She has been using Hydrofera Blue and Medihoney here. 12/19; patient presents for follow-up. Her left knee wound has healed. She has no issues or complaints today. 09/30/2022 Patient's daughter called for an appointment due to increased swelling to her lower extremities bilaterally. Today patient presents with increased swelling to her lower extremities although there is no increased redness or warmth. She was evaluated by her nephrologist who increased her diuretics. Per patient and daughter her swelling has gone down to the leg Over the past several days. She does not have any open wounds. She was advised to elevate her legs and not consume excess salt By her PCP and nephrologist. Patient has compression stockings that she has been using sporadically. READMISSION 03/10/2023 Since her last visit to the clinic, the patient has been hospitalized at least twice, in February with COVID pneumonia and CHF exacerbation. She was discharged to a skilled nursing facility and then readmitted in April with fevers. She was noted to have pressure ulcers on her heels and sacrum. Per family declined skilled nursing placement upon discharge and she has apparently been residing at home. She has followed with the vascular surgery clinic regarding peripheral artery disease and due to ulcerations on her feet, she ultimately underwent arteriography with balloon angioplasties of the superficial femoral artery, Leiner, Katelyn [name] ([id number]) [id number] Page 13 of 19 popliteal artery and peroneal artery on the left, along with shockwave ultrasound assisted balloon angioplasty of the popliteal  artery and distal SFA. Given her multiple significant medical comorbidities, she is not a candidate for open surgery. She is deemed to be maximally vascularized this procedure, which was performed on 04 March 2023. T oday, she has a stage III pressure ulcer on her left heel and wounds on the PIP joints of her right third and left second toes. No sacral wound is present and the ulcer on her left heel has closed. 03/18/2023: She has a new ulcer on her left great toe. It was just noticed yesterday and the etiology is unknown. The fat layer is exposed and there is some slough accumulation. The other wounds are all essentially unchanged in size. There is a little bit more granulation tissue on the other toe wounds, but bone is still frankly exposed on both sides. The heel ulcer has thick slough accumulation. 03/26/2023: She has a new wound on the tip of her left third toe. It is black. Neither the patient nor her daughter are aware of how it began. The other wounds are stable. 03/31/2023: The culture that I took from her left third toe returned with MRSA. We contacted the patient's daughter to have her stop levofloxacin and start doxycycline, but she has not yet picked up the new antibiotic. All of her wounds have accumulated slough. Bone remains exposed. She is scheduled to follow- up with vascular surgery tomorrow to evaluate the patency of her revascularization. 04/09/2023: She saw her vascular surgeon and her ABIs are improved; her TBI's could not be checked due to  her bandages. She did finally start taking the prescribed doxycycline. All of her wounds look about the same today. 04/23/2023: No significant changes to any of her wounds aside from being slightly smaller other than the left dorsal great toe wound, which is stable. She has accumulated slough and eschar on all of the surfaces. Patient History Information obtained from Patient, Caregiver, Chart. Family History Cancer - Siblings, Heart Disease,  Stroke - Mother, No family history of Diabetes. Social History Former smoker - quit 2003, Marital Status - Widowed, Alcohol Use - Never, Drug Use - No History, Caffeine Use - Never. Medical History Eyes Patient has history of Cataracts Hematologic/Lymphatic Patient has history of Anemia Respiratory Denies history of Chronic Obstructive Pulmonary Disease (COPD) Cardiovascular Patient has history of Arrhythmia - a-fib, Congestive Heart Failure, Hypotension, Peripheral Venous Disease Musculoskeletal Patient has history of 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 Neurologic Patient has history of Neuropathy Hospitalization/Surgery History - Abdominal aortogram w/lower extremity. - Amputation toe (Right). - Colonoscopy. - Elbow surgery. - Laparoscopic hysterectomy. Medical A Surgical History Notes nd Hematologic/Lymphatic Thrombocytopenia, Anticoagulant long-term use Respiratory Pulmonary embolus Gastrointestinal Hiatal hernia Endocrine Hyperthyroidism, Hypothyroidism Genitourinary CKD stage III Musculoskeletal arthritis Objective Constitutional Hypertensive, asymptomatic. no acute distress. Vitals Time Taken: 3:17 PM, Height: 64 in, Weight: 157 lbs, BMI: 26.9, Temperature: 98.2 F, Pulse: 74 bpm, Respiratory Rate: 18 breaths/min, Blood Pressure: 185/71 mmHg. Respiratory Normal work of breathing on supplemental oxygen. General Notes: 04/23/2023: No significant changes to any of her wounds aside from being slightly smaller, other than the left dorsal great toe wound, which is stable. She has accumulated slough and eschar on all of the surfaces. Integumentary (Hair, Skin) [name], [name] ([id number]) [id number] Page 14 of 19 Wound #10 status is Open. Original cause of wound was Pressure Injury. The date acquired was: 10/09/2022. The wound has been in treatment 6 [name]. The wound is located on the Left T Second. The wound measures 0.5cm length x  0.7cm width x 0.1cm depth; 0.275cm^2 area and 0.027cm^3 volume. There is oe bone, joint, and Fat Layer (Subcutaneous Tissue) exposed. There is no tunneling or undermining noted. There is a small amount of serosanguineous drainage noted. The wound margin is distinct with the outline attached to the wound base. There is large (67-100%) red granulation within the wound bed. There is a small (1-33%) amount of necrotic tissue within the wound bed including Adherent Slough. The periwound skin appearance had no abnormalities noted for texture. The periwound skin appearance had no abnormalities noted for moisture. The periwound skin appearance had no abnormalities noted for color. Periwound temperature was noted as No Abnormality. Wound #11 status is Open. Original cause of wound was Pressure Injury. The date acquired was: 10/09/2022. The wound has been in treatment 6 [name]. The wound is located on the Right T Third. The wound measures 0.8cm length x 0.8cm width x 0.1cm depth; 0.503cm^2 area and 0.05cm^3 volume. There is bone, oe joint, and Fat Layer (Subcutaneous Tissue) exposed. There is no tunneling or undermining noted. There is a medium amount of serosanguineous drainage noted. The wound margin is distinct with the outline attached to the wound base. There is large (67-100%) red granulation within the wound bed. There is a small (1-33%) amount of necrotic tissue within the wound bed including Adherent Slough. The periwound skin appearance had no abnormalities noted for texture. The periwound skin appearance had no abnormalities noted for moisture. The periwound skin appearance had no abnormalities noted for color. Periwound temperature was noted  as No Abnormality. The periwound has tenderness on palpation. Wound #12 status is Open. Original cause of wound was Gradually Appeared. The date acquired was: 03/17/2023. The wound has been in treatment 5 [name]. The wound is located on the Left T Great. The wound  measures 0.4cm length x 0.5cm width x 0.1cm depth; 0.157cm^2 area and 0.016cm^3 volume. There is Fat oe Layer (Subcutaneous Tissue) exposed. There is no tunneling or undermining noted. There is a small amount of purulent drainage noted. The wound margin is flat and intact. There is small (1-33%) pink granulation within the wound bed. There is a large (67-100%) amount of necrotic tissue within the wound bed including Adherent Slough. The periwound skin appearance had no abnormalities noted for texture. The periwound skin appearance had no abnormalities noted for moisture. The periwound skin appearance did not exhibit: Erythema. Periwound temperature was noted as No Abnormality. The periwound has tenderness on palpation. Wound #13 status is Open. Original cause of wound was Gradually Appeared. The date acquired was: 03/24/2023. The wound has been in treatment 4 [name]. The wound is located on the Left,Dorsal T Great. The wound measures 0.6cm length x 1cm width x 0.1cm depth; 0.471cm^2 area and 0.047cm^3 volume. oe There is Fat Layer (Subcutaneous Tissue) exposed. There is no tunneling or undermining noted. There is a medium amount of serosanguineous drainage noted. The wound margin is distinct with the outline attached to the wound base. There is small (1-33%) red granulation within the wound bed. There is a large (67-100%) amount of necrotic tissue within the wound bed including Eschar and Adherent Slough. The periwound skin appearance had no abnormalities noted for texture. The periwound skin appearance had no abnormalities noted for moisture. The periwound skin appearance exhibited: Erythema. The surrounding wound skin color is noted with erythema which is circumferential. Periwound temperature was noted as No Abnormality. The periwound has tenderness on palpation. Wound #14 status is Open. Original cause of wound was Gradually Appeared. The date acquired was: 03/24/2023. The wound has been in treatment  4 [name]. The wound is located on the Left T Third. The wound measures 0.4cm length x 0.4cm width x 0.1cm depth; 0.126cm^2 area and 0.013cm^3 volume. There is oe bone and Fat Layer (Subcutaneous Tissue) exposed. There is no tunneling or undermining noted. There is a medium amount of serosanguineous drainage noted. The wound margin is distinct with the outline attached to the wound base. There is large (67-100%) red granulation within the wound bed. There is a small (1-33%) amount of necrotic tissue within the wound bed including Adherent Slough. The periwound skin appearance exhibited: Scarring, Hemosiderin Staining. The periwound skin appearance did not exhibit: Dry/Scaly, Maceration. Periwound temperature was noted as No Abnormality. The periwound has tenderness on palpation. Wound #15 status is Open. Original cause of wound was Gradually Appeared. The date acquired was: 03/26/2023. The wound has been in treatment 4 [name]. The wound is located on the Right,Dorsal T [place]. The wound measures 0.5cm length x 0.6cm width x 0.1cm depth; 0.236cm^2 area and 0.024cm^3 volume. oe There is Fat Layer (Subcutaneous Tissue) exposed. There is no tunneling or undermining noted. There is a medium amount of serosanguineous drainage noted. The wound margin is flat and intact. There is large (67-100%) red granulation within the wound bed. There is a small (1-33%) amount of necrotic tissue within the wound bed including Adherent Slough. The periwound skin appearance had no abnormalities noted for texture. The periwound skin appearance had no abnormalities noted for moisture. The periwound skin  appearance had no abnormalities noted for color. Periwound temperature was noted as No Abnormality. The periwound has tenderness on palpation. Wound #9 status is Open. Original cause of wound was Pressure Injury. The date acquired was: 10/22/2022. The wound has been in treatment 6 [name]. The wound is located on the Left Calcaneus.  The wound measures 0.7cm length x 0.6cm width x 0.1cm depth; 0.33cm^2 area and 0.033cm^3 volume. There is Fat Layer (Subcutaneous Tissue) exposed. There is no tunneling or undermining noted. There is a small amount of serous drainage noted. The wound margin is distinct with the outline attached to the wound base. There is small (1-33%) pink, pale granulation within the wound bed. There is a large (67-100%) amount of necrotic tissue within the wound bed including Adherent Slough. The periwound skin appearance had no abnormalities noted for texture. The periwound skin appearance had no abnormalities noted for moisture. The periwound skin appearance had no abnormalities noted for color. Periwound temperature was noted as No Abnormality. The periwound has tenderness on palpation. 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 There was a Selective/Open Wound Non-Viable Tissue oe Debridement with a total area of 0.27 sq cm performed by Megan Guess, MD. With the following instrument(s): Curette to remove Non-Viable tissue/material. Material removed includes Eschar and Slough and after achieving pain control using Lidocaine 4% T opical Solution. No specimens were taken. A time out was conducted at 15:55, prior to the start of the procedure. A Minimum amount of bleeding was controlled with Pressure. The procedure  was tolerated well with a pain level of 0 throughout and a pain level of 0 following the procedure. Post Debridement Measurements: 0.5cm length x 0.7cm width x 0.1cm depth; 0.027cm^3 volume. Post debridement Stage noted as Category/Stage IV. Character of Wound/Ulcer Post Debridement is stable. Post procedure Diagnosis Wound #10: Same as Pre-Procedure General Notes: Scribed for Dr Lady Gary by Megan Grills RN. [name], [name] ([id number]) [id number] Page 15 of 19 Wound #11 Pre-procedure diagnosis of Wound #11 is a Pressure Ulcer located on the Right T Third . There was a Selective/Open Wound Non-Viable Tissue Debridement oe with a total area of 0.5 sq cm performed by Megan Guess, MD. With the following instrument(s): Curette to remove Non-Viable tissue/material. Material removed includes Eschar and Slough and after achieving pain control using Lidocaine 4% T opical Solution. No specimens were taken. A time out was conducted at 15:55, prior to the start of the procedure. A Minimum amount of bleeding was controlled with Pressure. The procedure was tolerated well with a pain level of 0 throughout and a pain level of 0 following the procedure. Post Debridement Measurements: 0.8cm length x 0.8cm width x 0.1cm depth; 0.05cm^3 volume. Post debridement Stage noted as Category/Stage IV. Character of Wound/Ulcer Post Debridement is stable. Post procedure Diagnosis Wound #11: Same as Pre-Procedure General Notes: Scribed for Dr Lady Gary by Megan Grills RN. Wound #12 Pre-procedure diagnosis of Wound #12 is an Arterial Insufficiency Ulcer located on the Left T Great .Severity of Tissue  Pre Debridement is: Fat layer oe exposed. There was a Selective/Open Wound Non-Viable Tissue Debridement with a total area of 0.16 sq cm performed by Megan Guess, MD. With the following instrument(s): Curette to remove Non-Viable tissue/material. Material removed includes Eschar and Slough and  after achieving pain control using Lidocaine 4% T opical Solution. No specimens were taken. A time out was conducted at 15:55, prior to the start of the procedure. A Minimum amount of bleeding was controlled with Pressure. The procedure was tolerated well with a pain level of 0 throughout and a pain level of 0 following the procedure. Post Debridement Measurements: 0.4cm length x 0.5cm width x 0.1cm depth; 0.016cm^3 volume. Character of Wound/Ulcer Post Debridement is stable. Severity of Tissue Post Debridement is: Fat layer exposed. Post procedure Diagnosis Wound #12: Same as Pre-Procedure General Notes: Scribed for Dr Lady Gary by Megan Grills RN. Wound #13 Pre-procedure diagnosis of Wound #13 is an Arterial Insufficiency Ulcer located on the Left,Dorsal T Great .Severity of Tissue Pre Debridement is: Fat layer oe exposed. There was a Selective/Open Wound Non-Viable Tissue Debridement with a total area of 0.47 sq cm performed by Megan Guess, MD. With the following instrument(s): Curette to remove Non-Viable tissue/material. Material removed includes Eschar and Slough and after achieving pain control using Lidocaine 4% T opical Solution. No specimens were taken. A time out was conducted at 15:55, prior to the start of the procedure. A Minimum amount of bleeding was controlled with Pressure. The procedure was tolerated well with a pain level of 0 throughout and a pain level of 0 following the procedure. Post Debridement Measurements: 0.6cm length x 1cm width x 0.1cm depth; 0.047cm^3 volume. Character of Wound/Ulcer Post Debridement is stable. Severity of Tissue Post Debridement is: Fat layer exposed. Post procedure Diagnosis Wound #13: Same as Pre-Procedure General Notes: Scribed for Dr Lady Gary by Megan Grills RN. Wound #14 Pre-procedure diagnosis of Wound #14 is an Arterial Insufficiency Ulcer located on the Left T Third .Severity of Tissue Pre Debridement is: Fat layer oe exposed. There was  a Selective/Open Wound Non-Viable Tissue Debridement with a total area of 0.13 sq cm performed by Megan Guess, MD. With the following instrument(s): Curette to remove Non-Viable tissue/material. Material removed includes Eschar and Slough and after achieving pain control using Lidocaine 4% T opical Solution. No specimens were taken. A time out was conducted at 15:55, prior to the start of the procedure. A Minimum amount of bleeding was controlled with Pressure. The procedure was tolerated well with a pain level of 0 throughout and a pain level of 0 following the procedure. Post Debridement Measurements: 0.4cm length x 0.4cm width x 0.1cm depth; 0.013cm^3 volume. Character of Wound/Ulcer Post Debridement is stable. Severity of Tissue Post Debridement is: Fat layer exposed. Post procedure Diagnosis Wound #14: Same as Pre-Procedure General Notes: Scribed for Dr Lady Gary by Megan Grills RN. Wound #15 Pre-procedure diagnosis of Wound #15 is an Arterial Insufficiency Ulcer located on the Right,Dorsal T Great .Severity of Tissue Pre Debridement is: Fat oe layer exposed. There was a Selective/Open Wound Non-Viable Tissue Debridement with a total area of 0.24 sq cm performed by Megan Guess, MD. With the following instrument(s): Curette to remove Non-Viable tissue/material. Material removed includes Eschar and Slough and after achieving pain control using Lidocaine 4% T opical Solution. No specimens were taken. A time out was conducted at 15:55, prior to the start of the procedure. A Minimum amount of bleeding was controlled with Pressure. The procedure was tolerated well with a  pain level of 0 throughout and a pain level of 0 following the procedure. Post Debridement Measurements: 0.5cm length x 0.6cm width x 0.1cm depth; 0.024cm^3 volume. Character of Wound/Ulcer Post Debridement is stable. Severity of Tissue Post Debridement is: Fat layer exposed. Post procedure Diagnosis Wound #15: Same as  Pre-Procedure General Notes: Scribed for Dr Lady Gary by Megan Grills RN. Wound #9 Pre-procedure diagnosis of Wound #9 is a Pressure Ulcer located on the Left Calcaneus . There was a Excisional Skin/Subcutaneous Tissue Debridement with a total area of 0.33 sq cm performed by Megan Guess, MD. With the following instrument(s): Curette to remove Non-Viable tissue/material. Material removed includes Eschar, Subcutaneous Tissue, and Slough after achieving pain control using Lidocaine 4% T opical Solution. No specimens were taken. A time out was conducted at 15:55, prior to the start of the procedure. A Minimum amount of bleeding was controlled with Pressure. The procedure was tolerated well with a pain level of 0 throughout and a pain level of 0 following the procedure. Post Debridement Measurements: 0.7cm length x 0.6cm width x 0.1cm depth; 0.033cm^3 volume. Post debridement Stage noted as Category/Stage III. Character of Wound/Ulcer Post Debridement is stable. Post procedure Diagnosis Wound #9: Same as Pre-Procedure General Notes: Scribed for Dr Lady Gary by Megan Grills RN. Plan Follow-up Appointments: Return Appointment in 1 week. - Dr. Lady Gary - room 1 Anesthetic: (In clinic) Topical Lidocaine 4% applied to wound bed Bathing/ Shower/ Hygiene: May shower and wash wound with soap and water. Edema Control - Lymphedema / SCD / Other: Elevate legs to the level of the heart or above for 30 minutes daily and/or when sitting for 3-4 times a day throughout the day. Avoid standing for long periods of time. Patient to wear own compression stockings every day. Additional Orders / Instructions: Follow Nutritious Diet - add in protein shakes every day to diet - recommend premier protein 500 mg x3 a day vitamin C, zinc 30-50 mg per day Home Health: No change in wound care orders this week; continue Home Health for wound care. May utilize formulary equivalent dressing for wound treatment orders  unless otherwise specified. Other Home Health Orders/Instructions: - Medi Home WOUND #10: - T Second Wound Laterality: Left oe Cleanser: Soap and Water 1 x Per Day/30 Days [name], [name] ([id number]) [id number] Page 16 of 19 Discharge Instructions: May shower and wash wound with dial antibacterial soap and water prior to dressing change. Cleanser: Wound Cleanser 1 x Per Day/30 Days Discharge Instructions: Cleanse the wound with wound cleanser prior to applying a clean dressing using gauze sponges, not tissue or cotton balls. Prim Dressing: Promogran Prisma Matrix, 4.34 (sq in) (silver collagen) (Generic) 1 x Per Day/30 Days ary Discharge Instructions: Moisten collagen with saline or hydrogel Secondary Dressing: Woven Gauze Sponge, Non-Sterile 4x4 in (Generic) 1 x Per Day/30 Days Discharge Instructions: Apply over primary dressing as directed. Secured With: 94M Medipore H Soft Cloth Surgical T ape, 4 x 10 (in/yd) (Generic) 1 x Per Day/30 Days Discharge Instructions: Secure with tape as directed. WOUND #11: - T Third Wound Laterality: Right oe Cleanser: Soap and Water 1 x Per Day/30 Days Discharge Instructions: May shower and wash wound with dial antibacterial soap and water prior to dressing change. Cleanser: Wound Cleanser 1 x Per Day/30 Days Discharge Instructions: Cleanse the wound with wound cleanser prior to applying a clean dressing using gauze sponges, not tissue or cotton balls. Prim Dressing: Promogran Prisma Matrix, 4.34 (sq in) (silver collagen) (Generic) 1 x Per Day/30 Days  ary Discharge Instructions: Moisten collagen with saline or hydrogel Secondary Dressing: Woven Gauze Sponge, Non-Sterile 4x4 in (Generic) 1 x Per Day/30 Days Discharge Instructions: Apply over primary dressing as directed. Secured With: 78M Medipore H Soft Cloth Surgical T ape, 4 x 10 (in/yd) (Generic) 1 x Per Day/30 Days Discharge Instructions: Secure with tape as directed. WOUND  #12: - T Great Wound Laterality: Left oe Cleanser: Soap and Water 1 x Per Day/30 Days Discharge Instructions: May shower and wash wound with dial antibacterial soap and water prior to dressing change. Cleanser: Wound Cleanser 1 x Per Day/30 Days Discharge Instructions: Cleanse the wound with wound cleanser prior to applying a clean dressing using gauze sponges, not tissue or cotton balls. Prim Dressing: Promogran Prisma Matrix, 4.34 (sq in) (silver collagen) (Generic) 1 x Per Day/30 Days ary Discharge Instructions: Moisten collagen with saline or hydrogel Secondary Dressing: Woven Gauze Sponge, Non-Sterile 4x4 in (Generic) 1 x Per Day/30 Days Discharge Instructions: Apply over primary dressing as directed. Secured With: 78M Medipore H Soft Cloth Surgical T ape, 4 x 10 (in/yd) (Generic) 1 x Per Day/30 Days Discharge Instructions: Secure with tape as directed. WOUND #13: - T Great Wound Laterality: Dorsal, Left oe Cleanser: Soap and Water 1 x Per Day/30 Days Discharge Instructions: May shower and wash wound with dial antibacterial soap and water prior to dressing change. Cleanser: Wound Cleanser 1 x Per Day/30 Days Discharge Instructions: Cleanse the wound with wound cleanser prior to applying a clean dressing using gauze sponges, not tissue or cotton balls. Prim Dressing: Promogran Prisma Matrix, 4.34 (sq in) (silver collagen) (Generic) 1 x Per Day/30 Days ary Discharge Instructions: Moisten collagen with saline or hydrogel Secondary Dressing: Woven Gauze Sponge, Non-Sterile 4x4 in (Generic) 1 x Per Day/30 Days Discharge Instructions: Apply over primary dressing as directed. Secured With: 78M Medipore H Soft Cloth Surgical T ape, 4 x 10 (in/yd) (Generic) 1 x Per Day/30 Days Discharge Instructions: Secure with tape as directed. WOUND #14: - T Third Wound Laterality: Left oe Cleanser: Soap and Water 1 x Per Day/30 Days Discharge Instructions: May shower and wash wound with dial antibacterial  soap and water prior to dressing change. Cleanser: Wound Cleanser 1 x Per Day/30 Days Discharge Instructions: Cleanse the wound with wound cleanser prior to applying a clean dressing using gauze sponges, not tissue or cotton balls. Prim Dressing: Promogran Prisma Matrix, 4.34 (sq in) (silver collagen) (Generic) 1 x Per Day/30 Days ary Discharge Instructions: Moisten collagen with saline or hydrogel Secondary Dressing: Woven Gauze Sponge, Non-Sterile 4x4 in (Generic) 1 x Per Day/30 Days Discharge Instructions: Apply over primary dressing as directed. Secured With: 78M Medipore H Soft Cloth Surgical T ape, 4 x 10 (in/yd) (Generic) 1 x Per Day/30 Days Discharge Instructions: Secure with tape as directed. WOUND #15: - T Great Wound Laterality: Dorsal, Right oe Cleanser: Soap and Water 1 x Per Day/30 Days Discharge Instructions: May shower and wash wound with dial antibacterial soap and water prior to dressing change. Cleanser: Wound Cleanser 1 x Per Day/30 Days Discharge Instructions: Cleanse the wound with wound cleanser prior to applying a clean dressing using gauze sponges, not tissue or cotton balls. Prim Dressing: Promogran Prisma Matrix, 4.34 (sq in) (silver collagen) (Generic) 1 x Per Day/30 Days ary Discharge Instructions: Moisten collagen with saline or hydrogel Secondary Dressing: Woven Gauze Sponge, Non-Sterile 4x4 in (Generic) 1 x Per Day/30 Days Discharge Instructions: Apply over primary dressing as directed. Secured With: 78M Medipore Financial risk analyst Surgical T  ape, 4 x 10 (in/yd) (Generic) 1 x Per Day/30 Days Discharge Instructions: Secure with tape as directed. WOUND #9: - Calcaneus Wound Laterality: Left Cleanser: Soap and Water 1 x Per Day/30 Days Discharge Instructions: May shower and wash wound with dial antibacterial soap and water prior to dressing change. Cleanser: Wound Cleanser 1 x Per Day/30 Days Discharge Instructions: Cleanse the wound with wound cleanser prior to  applying a clean dressing using gauze sponges, not tissue or cotton balls. Prim Dressing: Promogran Prisma Matrix, 4.34 (sq in) (silver collagen) (Generic) 1 x Per Day/30 Days ary Discharge Instructions: Moisten collagen with saline or hydrogel Secondary Dressing: ALLEVYN Heel 4 1/2in x 5 1/2in / 10.5cm x 13.5cm (Generic) 1 x Per Day/30 Days Discharge Instructions: Apply over primary dressing as directed. Secondary Dressing: Woven Gauze Sponge, Non-Sterile 4x4 in (Generic) 1 x Per Day/30 Days Discharge Instructions: Apply over primary dressing as directed. Secured With: 9M Medipore H Soft Cloth Surgical T ape, 4 x 10 (in/yd) (Generic) 1 x Per Day/30 Days Discharge Instructions: Secure with tape as directed. 04/23/2023: No significant changes to any of her wounds aside from being slightly smaller other than the left dorsal great toe wound, which is stable. She has accumulated slough and eschar on all of the surfaces. I used a curette to debride slough and eschar from all of her wounds. I also debrided subcutaneous tissue from her right heel wound. We will continue Prisma silver collagen to all of the wound sites. Continue to offload the heel as much as possible. Follow-up in 2 [name]. Electronic Signature(s) SKYELYN, BILLINGSLEA ([id number]) [id number] Page 17 of 19 Signed: 04/23/2023 4:14:28 PM By: Megan Guess MD FACS Entered By: [name] on 04/23/2023 16:14:28 -------------------------------------------------------------------------------- HxROS Details Patient Name: Date of Service: Ola, HA TTIE [name]. 04/23/2023 2:30 PM Medical Record Number: [id number] Patient Account Number: [id number] Date of Birth/Sex: Treating RN: 05-Jul-1928 (89 y.o. F) Primary Care Provider: Jorge [name] Other Clinician: Referring Provider: Treating Provider/Extender: Tiajuana Amass in Treatment: 6 Information Obtained From Patient Caregiver Chart Eyes Medical  History: Positive for: Cataracts Hematologic/Lymphatic Medical History: Positive for: Anemia Past Medical History Notes: Thrombocytopenia, Anticoagulant long-term use Respiratory Medical History: Negative for: Chronic Obstructive Pulmonary Disease (COPD) Past Medical History Notes: Pulmonary embolus Cardiovascular Medical History: Positive for: Arrhythmia - a-fib; Congestive Heart Failure; Hypotension; Peripheral Venous Disease Gastrointestinal Medical History: Past Medical History Notes: Hiatal hernia Endocrine Medical History: Past Medical History Notes: Hyperthyroidism, Hypothyroidism Genitourinary Medical History: Past Medical History Notes: CKD stage III Musculoskeletal Medical History: Positive for: 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 Past Medical History Notes: arthritis Neurologic Medical History: Positive for: Neuropathy HBO Extended History Items Schnackenberg, Sarahanne [name] ([id number]) [id number] Page 18 of 19 Eyes: Cataracts Immunizations Pneumococcal Vaccine: Received Pneumococcal Vaccination: No Implantable Devices None Hospitalization / Surgery History Type of Hospitalization/Surgery Abdominal aortogram w/lower extremity Amputation toe (Right) Colonoscopy Elbow surgery Laparoscopic hysterectomy Family and Social History Cancer: Yes - Siblings; Diabetes: No; Heart Disease: Yes; Stroke: Yes - Mother; Former smoker - quit 2003; Marital Status - Widowed; Alcohol Use: Never; Drug Use: No History; Caffeine Use: Never; Financial Concerns: No; Food, Clothing or Shelter Needs: No; Support System Lacking: No; Transportation Concerns: No Electronic Signature(s) Signed: 04/23/2023 4:15:46 PM By: Megan Guess MD FACS Entered By: [name] on 04/23/2023 16:11:12 -------------------------------------------------------------------------------- SuperBill Details Patient Name: Date of Service: [name].  04/23/2023 Medical Record Number: [id number] Patient Account Number: [id number] Date of Birth/Sex: Treating RN: April 12, 1928 (89 y.o. 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 without evidence of necrosis L97.522 Non-pressure chronic ulcer of other part of left foot with fat layer exposed I73.9 Peripheral vascular disease, unspecified I50.32 Chronic diastolic (congestive) heart failure N18.32 Chronic kidney disease, stage 3b I87.2 Venous insufficiency (chronic) (peripheral) Facility Procedures : CPT4 Code: [id number] Description: 11042 - DEB SUBQ TISSUE 20 SQ CM/< ICD-10 Diagnosis Description L89.623 Pressure ulcer of left heel, stage 3 Modifier: Quantity: 1 Physician Procedures : CPT4 Code Description Modifier [id number] - WC PHYS LEVEL 3 - EST PT 25 ICD-10 Diagnosis Description L89.623 Pressure ulcer of left heel, 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 without evidence of 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 Quantity: 1 osis sis : [id number] - WC PHYS SUBQ TISS 20 SQ CM ICD-10 Diagnosis Description L89.623 Pressure ulcer of left heel, stage 3 Quantity: 1 : [id number] - WC PHYS DEBR WO ANESTH 20 SQ CM ICD-10 Diagnosis Description 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

## 2023-05-05 NOTE — Progress Notes (Signed)
[name] ([id number]) [id number] Page 1 of 18 Visit Report for 04/30/2023 Arrival Information Details Patient Name: Date of Service: [name], [name] 04/30/2023 2:30 PM Medical Record Number: [id number] Patient Account Number: [id number] Date of Birth/Sex: Treating RN: [birth year] (89 y.o. F) Primary Care Shuntavia Yerby: [name] Other Clinician: Referring Jermaine Neuharth: Treating Alphonsa Brickle/Extender: Tiajuana Amass in Treatment: 7 Visit Information History Since Last Visit All ordered tests and consults were completed: No Patient Arrived: Wheel Chair Added or deleted any medications: No Arrival Time: 14:44 Any new allergies or adverse reactions: No Accompanied By: daughter Had a fall or experienced change in No Transfer Assistance: None activities of daily living that Megan affect Patient Identification Verified: Yes risk of falls: Secondary Verification Process Completed: Yes Signs or symptoms of abuse/neglect since last visito No Patient Requires Transmission-Based Precautions: No Hospitalized since last visit: No Patient Has Alerts: Yes Implantable device outside of the clinic excluding No Patient Alerts: ABI R: 0.48 L: 0.46 2/24 cellular tissue based products placed in the center TBI R: 0.39 L: 0.21 2/24 since last visit: Pain Present Now: No Electronic Signature(s) Signed: 04/30/2023 4:20:42 PM By: Dayton Scrape Entered By: Dayton Scrape on 04/30/2023 14:45:22 -------------------------------------------------------------------------------- Encounter Discharge Information Details Patient Name: Date of Service: [name]. 04/30/2023 2:30 PM Medical Record Number: [id number] Patient Account Number: [id number] Date of Birth/Sex: Treating RN: [name] (89 y.o. [name] Primary Care Wanell Lorenzi: [name] Other Clinician: Referring Alvah Lagrow: Treating Yordi Krager/Extender: Claudie Leach Weeks in Treatment:  7 Encounter Discharge Information Items Post Procedure Vitals Discharge Condition: Stable Temperature (F): 98.2 Ambulatory Status: Wheelchair Pulse (bpm): 78 Discharge Destination: Home Respiratory Rate (breaths/min): 18 Transportation: Private Auto Blood Pressure (mmHg): 132/78 Accompanied By: daughter Schedule Follow-up Appointment: Yes Clinical Summary of Care: Patient Declined Electronic Signature(s) Signed: 05/05/2023 2:02:59 PM By: Brenton Grills Entered By: Brenton Grills on 04/30/2023 15:52:30 [name], [name] ([id number]) [id number] Page 2 of 18 -------------------------------------------------------------------------------- Lower Extremity Assessment Details Patient Name: Date of Service: [name], [name] 04/30/2023 2:30 PM Medical Record Number: [id number] Patient Account Number: [id number] Date of Birth/Sex: Treating RN: [name] (89 y.o. [name] Primary Care Maximiano Lott: [name] Other Clinician: Referring Malique Driskill: Treating Ellison Rieth/Extender: Claudie Leach Weeks in Treatment: 7 Edema Assessment Assessed: [Left: No] [Right: No] Edema: [Left: Yes] [Right: Yes] Calf Left: Right: Point of Measurement: From Medial Instep 35.5 cm 40.5 cm Ankle Left: Right: Point of Measurement: From Medial Instep 22 cm 22.1 cm Vascular Assessment Pulses: Dorsalis Pedis Palpable: [Left:Yes] [Right:Yes] Extremity colors, hair growth, and conditions: Extremity Color: [Left:Hyperpigmented] [Right:Hyperpigmented] Hair Growth on Extremity: [Left:No] [Right:No] Temperature of Extremity: [Left:Warm < 3 seconds] [Right:Warm < 3 seconds] Toe Nail Assessment Left: Right: Thick: Yes Yes Discolored: Yes Yes Deformed: Yes Yes Improper Length and Hygiene: Yes Yes Electronic Signature(s) Signed: 05/05/2023 2:02:59 PM By: Brenton Grills Entered By: Brenton Grills on 04/30/2023  14:53:50 -------------------------------------------------------------------------------- Multi Wound Chart Details Patient Name: Date of Service: [name]. 04/30/2023 2:30 PM Medical Record Number: [id number] Patient Account Number: [id number] Date of Birth/Sex: Treating RN: [name] (89 y.o. F) Primary Care Roza Creamer: [name] Other Clinician: Referring Saphire Barnhart: Treating Lev Cervone/Extender: Claudie Leach Weeks in Treatment: 7 Vital Signs Height(in): 64 Pulse(bpm): 77 Weight(lbs): 157 Blood Pressure(mmHg): 157/72 Body Mass Index(BMI): 26.9 Temperature(F): 97.8 Respiratory Rate(breaths/min): 18 Figeroa, [name] ([id number]) [id number] Page 3 of 18 [10:Photos:] Left T Second oe Right T Second oe Left T Great oe Wound Location: Pressure Injury Pressure Injury  Gradually Appeared Wounding Event: Pressure Ulcer Pressure Ulcer Arterial Insufficiency Ulcer Primary Etiology: Cataracts, Anemia, Arrhythmia, Cataracts, Anemia, Arrhythmia, Cataracts, Anemia, Arrhythmia, Comorbid History: Congestive Heart Failure, Congestive Heart Failure, Congestive Heart Failure, Hypotension, Peripheral Venous Hypotension, Peripheral Venous Hypotension, Peripheral Venous Disease, Osteomyelitis, Neuropathy Disease, Osteomyelitis, Neuropathy Disease, Osteomyelitis, Neuropathy 10/09/2022 10/09/2022 03/17/2023 Date Acquired: 7 7 6  Weeks of Treatment: Open Open Open Wound Status: No No No Wound Recurrence: 0.5x0.6x0.1 0.8x0.8x0.1 0.5x0.5x0.1 Measurements L x W x D (cm) 0.236 0.503 0.196 A (cm) : rea 0.024 0.05 0.02 Volume (cm) : 66.60% 28.90% 60.40% % Reduction in A rea: 66.20% 29.60% 59.20% % Reduction in Volume: Category/Stage IV Category/Stage IV Full Thickness Without Exposed Classification: Support Structures Small Medium Small Exudate A mount: Serosanguineous Serosanguineous Purulent Exudate Type: red, brown red, brown yellow, brown,  green Exudate Color: Distinct, outline attached Distinct, outline attached Flat and Intact Wound Margin: Large (67-100%) Large (67-100%) Small (1-33%) Granulation A mount: Red Red Pink Granulation Quality: Small (1-33%) Small (1-33%) Large (67-100%) Necrotic A mount: Adherent Slough Adherent [employer] Necrotic Tissue: Fat Layer (Subcutaneous Tissue): Yes Fat Layer (Subcutaneous Tissue): Yes Fat Layer (Subcutaneous Tissue): Yes Exposed Structures: Joint: Yes Joint: Yes Fascia: No Bone: Yes Bone: Yes Tendon: No Fascia: No Fascia: No Muscle: No Tendon: No Tendon: No Joint: No Muscle: No Muscle: No Bone: No Small (1-33%) None None Epithelialization: Debridement - Selective/Open Wound Debridement - Selective/Open Wound Debridement - Selective/Open Wound Debridement: Pre-procedure Verification/Time Out 15:26 15:26 15:26 Taken: Lidocaine 4% Topical Solution Lidocaine 4% Topical Solution Lidocaine 4% Topical Solution Pain Control: Necrotic/Eschar, Ambulance person, Ambulance person, [employer] Tissue Debrided: Non-Viable Tissue Non-Viable Tissue Non-Viable Tissue Level: 0.24 0.5 0.2 Debridement A (sq cm): rea Curette Curette Curette Instrument: Minimum Minimum Minimum Bleeding: Pressure Pressure Pressure Hemostasis A chieved: 0 0 0 Procedural Pain: 0 0 0 Post Procedural Pain: Procedure was tolerated well Procedure was tolerated well Procedure was tolerated well Debridement Treatment Response: 0.5x0.6x0.1 0.8x0.8x0.1 0.5x0.5x0.1 Post Debridement Measurements L x W x D (cm) 0.024 0.05 0.02 Post Debridement Volume: (cm) Category/Stage IV Category/Stage IV N/A Post Debridement Stage: No Abnormalities Noted No Abnormalities Noted No Abnormalities Noted Periwound Skin Texture: Dry/Scaly: Yes Dry/Scaly: Yes No Abnormalities Noted Periwound Skin Moisture: No Abnormalities Noted Rubor: No Erythema: No Periwound Skin Color: N/A N/A  N/A Erythema Location: No Abnormality No Abnormality No Abnormality Temperature: N/A Yes Yes Tenderness on Palpation: Debridement Debridement Debridement Procedures Performed: Wound Number: [id number]  Photos: Left, Dorsal T Great oe Left T Third oe Right, Dorsal T Great oe Wound Location: Gradually Appeared Gradually Appeared Gradually Appeared Wounding Event: ARLISA, VANNOSTRAND ([id number]) [id number] Page 4 of 18 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 Etiology: Cataracts, Anemia, Arrhythmia, Cataracts, Anemia, Arrhythmia, Cataracts, Anemia, Arrhythmia, Comorbid History: Congestive Heart Failure, Congestive Heart Failure, Congestive Heart Failure, Hypotension, Peripheral Venous Hypotension, Peripheral Venous Hypotension, Peripheral Venous Disease, Osteomyelitis, Neuropathy Disease, Osteomyelitis, Neuropathy Disease, Osteomyelitis, Neuropathy 03/24/2023 03/24/2023 03/26/2023 Date Acquired: 5 5 5  Weeks of Treatment: Open Open Open Wound Status: No No No Wound Recurrence: 0.6x0.9x0.1 0.4x0.4x0.1 0.5x0.5x0.1 Measurements L x W x D (cm) 0.424 0.126 0.196 A (cm) : rea 0.042 0.013 0.02 Volume (cm) : -436.70% 0.00% -176.10% % Reduction in A rea: -425.00% 0.00% -185.70% % Reduction in Volume: Full Thickness Without Exposed Full Thickness With Exposed Support Full Thickness Without Exposed Classification: Support Structures Structures Support Structures Medium Medium Medium Exudate A mount: Serosanguineous Serosanguineous Serosanguineous Exudate Type: red, brown red, brown red, brown Exudate  Color: Distinct, outline attached Distinct, outline attached Flat and Intact Wound Margin: Small (1-33%) Large (67-100%) Large (67-100%) Granulation A mount: Red Red Red Granulation Quality: Large (67-100%) Small (1-33%) Small (1-33%) Necrotic A mount: Eschar, Adherent Slough Adherent [employer] Necrotic Tissue: Fat Layer (Subcutaneous Tissue): Yes Fat Layer (Subcutaneous Tissue): Yes Fat Layer (Subcutaneous Tissue): Yes Exposed Structures: Bone: Yes Fascia: No Tendon: No Muscle: No Joint: No Bone: No None None Small (1-33%) Epithelialization: Debridement - Selective/Open Wound Debridement - Selective/Open Wound Debridement - Selective/Open Wound Debridement: Pre-procedure Verification/Time Out 15:26 15:26 15:26 Taken: Lidocaine 4% Topical Solution Lidocaine 4% Topical Solution Lidocaine 4% Topical Solution Pain Control: Necrotic/Eschar, Ambulance person, Ambulance person, [employer] Tissue Debrided: Non-Viable Tissue Non-Viable Tissue Non-Viable Tissue Level: 0.42 0.13 0.2 Debridement A (sq cm): rea Curette Curette Curette Instrument: Minimum Minimum Minimum Bleeding: Pressure Pressure Pressure Hemostasis A chieved: 0 0 0 Procedural Pain: 0 0 0 Post Procedural Pain: Procedure was tolerated well Procedure was tolerated well Procedure was tolerated well Debridement Treatment Response: 0.6x0.9x0.1 0.4x0.4x0.1 0.5x0.5x0.1 Post Debridement Measurements L x W x D (cm) 0.042 0.013 0.02 Post Debridement Volume: (cm) N/A N/A N/A Post Debridement Stage: No Abnormalities Noted Scarring: Yes No Abnormalities Noted Periwound Skin Texture: No Abnormalities Noted Maceration: No No Abnormalities Noted Periwound Skin Moisture: Dry/Scaly: No Erythema: Yes Hemosiderin Staining: Yes No Abnormalities Noted Periwound Skin Color: Circumferential N/A N/A Erythema Location: No Abnormality No Abnormality No Abnormality Temperature: Yes Yes Yes Tenderness on Palpation: Debridement Debridement Debridement Procedures Performed: Wound Number: 9 N/A N/A Photos: N/A N/A Left Calcaneus N/A N/A Wound Location: Pressure Injury N/A N/A Wounding Event: Pressure Ulcer N/A N/A Primary Etiology: Cataracts, Anemia, Arrhythmia, N/A N/A Comorbid History: Congestive  Heart Failure, Hypotension, Peripheral Venous Disease, Osteomyelitis, Neuropathy 10/22/2022 N/A N/A Date Acquired: 7 N/A N/A Weeks of Treatment: Open N/A N/A Wound Status: No N/A N/A Wound Recurrence: 0.4x0.4x0.1 N/A N/A Measurements L x W x D (cm) 0.126 N/A N/A A (cm) : rea 0.013 N/A N/A Volume (cm) : 61.80% N/A N/A % Reduction in A rea: 86.90% N/A N/A % Reduction in Volume: Category/Stage II N/A N/A Classification: Small N/A N/A Exudate A mountJUSTI, DEMBY ([id number]) [id number] Page 5 of 18 Serous N/A N/A Exudate Type: amber N/A N/A Exudate Color: Distinct, outline attached N/A N/A Wound Margin: Small (1-33%) N/A N/A Granulation Amount: Pink, Pale N/A N/A Granulation Quality: Large (67-100%) N/A N/A Necrotic Amount: N/A N/A N/A Necrotic Tissue: Fat Layer (Subcutaneous Tissue): Yes N/A N/A Exposed Structures: Fascia: No Tendon: No Muscle: No Joint: No Bone: No Small (1-33%) N/A N/A Epithelialization: Debridement - Excisional N/A N/A Debridement: Pre-procedure Verification/Time Out 15:26 N/A N/A Taken: Lidocaine 4% Topical Solution N/A N/A Pain Control: Necrotic/Eschar, Subcutaneous, N/A N/A Tissue Debrided: Slough Skin/Subcutaneous Tissue N/A N/A Level: 0.13 N/A N/A Debridement A (sq cm): rea Curette N/A N/A Instrument: Minimum N/A N/A Bleeding: Pressure N/A N/A Hemostasis Achieved: 0 N/A N/A Procedural Pain: 0 N/A N/A Post Procedural Pain: Debridement Treatment Response: Procedure was tolerated well N/A N/A Post Debridement Measurements L x 0.4x0.4x0.1 N/A N/A W x D (cm) 0.013 N/A N/A Post Debridement Volume: (cm) Category/Stage II N/A N/A Post Debridement Stage: Callus: No N/A N/A Periwound Skin Texture: Dry/Scaly: Yes N/A N/A Periwound Skin Moisture: No Abnormalities Noted N/A N/A Periwound Skin Color: N/A N/A N/A Erythema Location: No Abnormality N/A N/A Temperature: Yes N/A N/A Tenderness  on Palpation: Debridement N/A N/A Procedures Performed: Treatment Notes Electronic Signature(s) Signed: 04/30/2023 3:51:01 PM By: Duanne Guess  MD FACS Entered By: Duanne Guess on 04/30/2023 15:51:01 -------------------------------------------------------------------------------- Multi-Disciplinary Care Plan Details Patient Name: Date of Service: TATELYN, TULLIO 04/30/2023 2:30 PM Medical Record Number: [id number] Patient Account Number: [id number] Date of Birth/Sex: Treating RN: [name] (89 y.o. [name] Primary Care Brand Siever: [name] Other Clinician: Referring Megann Easterwood: Treating Arrow Tomko/Extender: Tiajuana Amass in Treatment: 7 Multidisciplinary Care Plan reviewed with physician Active Inactive Abuse / Safety / Falls / Self Care Management Nursing Diagnoses: Impaired home maintenance Impaired physical mobility Potential for falls Goals: Patient/caregiver will verbalize/demonstrate measure taken to improve self care Date Initiated: 03/10/2023 Target Resolution Date: 05/15/2023 JODETTE, BRANDWEIN ([id number]) [phone].pdf Page 6 of 18 Goal Status: Active Patient/caregiver will verbalize/demonstrate measures taken to improve the patient's personal safety Date Initiated: 03/10/2023 Target Resolution Date: 05/15/2023 Goal Status: Active Interventions: Assess fall risk on admission and as needed Assess: immobility, friction, shearing, incontinence upon admission and as needed Provide education on fall prevention Notes: Wound/Skin Impairment Nursing Diagnoses: Impaired tissue integrity Knowledge deficit related to ulceration/compromised skin integrity Goals: Patient/caregiver will verbalize understanding of skin care regimen Date Initiated: 03/10/2023 Target Resolution Date: 05/15/2023 Goal Status: Active Interventions: Assess patient/caregiver ability to obtain necessary supplies Assess patient/caregiver ability to  perform ulcer/skin care regimen upon admission and as needed Assess ulceration(s) every visit Treatment Activities: Skin care regimen initiated : 03/10/2023 Topical wound management initiated : 03/10/2023 Notes: Electronic Signature(s) Signed: 05/05/2023 2:02:59 PM By: Brenton Grills Entered By: Brenton Grills on 04/30/2023 15:19:03 -------------------------------------------------------------------------------- Pain Assessment Details Patient Name: Date of Service: [name]. 04/30/2023 2:30 PM Medical Record Number: [id number] Patient Account Number: [id number] Date of Birth/Sex: Treating RN: [birth year] [name] (89 y.o. F) Primary Care Ranbir Chew: [name] Other Clinician: Referring Janelys Glassner: Treating Kristofor Michalowski/Extender: Claudie Leach Weeks in Treatment: 7 Active Problems Location of Pain Severity and Description of Pain Patient Has Paino No Site Locations [place], Jesusita [name] ([id number]) [id number] Page 7 of 18 Pain Management and Medication Current Pain Management: Electronic Signature(s) Signed: 04/30/2023 4:20:42 PM By: Dayton Scrape Entered By: Dayton Scrape on 04/30/2023 14:45:50 -------------------------------------------------------------------------------- Patient/Caregiver Education Details Patient Name: Date of Service: [name] 8/22/2024andnbsp2:30 PM Medical Record Number: [id number] Patient Account Number: [id number] Date of Birth/Gender: Treating RN: [birth year] (89 y.o. [name] Primary Care Physician: [name] Other Clinician: Referring Physician: Treating Physician/Extender: Tiajuana Amass in Treatment: 7 Education Assessment Education Provided To: Patient Education Topics Provided Wound/Skin Impairment: Methods: Explain/Verbal Responses: State content correctly Electronic Signature(s) Signed: 05/05/2023 2:02:59 PM By: Brenton Grills Entered By: Brenton Grills on 04/30/2023  15:19:37 -------------------------------------------------------------------------------- Wound Assessment Details Patient Name: Date of Service: [name]. 04/30/2023 2:30 PM Medical Record Number: [id number] Patient Account Number: [id number] Date of Birth/Sex: Treating RN: [birth year] (89 y.o. [name] Primary Care Samuel Mcpeek: [name] Other Clinician: Referring Lona Six: Treating Adis Sturgill/Extender: Claudie Leach [place], [place] Judie Petit ([id number]) [id number] Page 8 of 18 Weeks in Treatment: 7 Wound Status Wound Number: 10 Primary Pressure Ulcer Etiology: Wound Location: Left T Second oe Wound Open Wounding Event: Pressure Injury Status: Date Acquired: 10/09/2022 Comorbid Cataracts, Anemia, Arrhythmia, Congestive Heart Failure, Weeks Of Treatment: 7 History: Hypotension, Peripheral Venous Disease, Osteomyelitis, Clustered Wound: No Neuropathy Photos Wound Measurements Length: (cm) 0.5 Width: (cm) 0.6 Depth: (cm) 0.1 Area: (cm) 0.236 Volume: (cm) 0.024 % Reduction in Area: 66.6% % Reduction in Volume: 66.2% Epithelialization: Small (1-33%) Tunneling: No Undermining: No Wound Description Classification: Category/Stage IV Wound Margin: Distinct, outline attached Exudate Amount:  Small Exudate Type: Serosanguineous Exudate Color: red, brown Foul Odor After Cleansing: No Slough/Fibrino No Wound Bed Granulation Amount: Large (67-100%) Exposed Structure Granulation Quality: Red Fascia Exposed: No Necrotic Amount: Small (1-33%) Fat Layer (Subcutaneous Tissue) Exposed: Yes Necrotic Quality: Adherent Slough Tendon Exposed: No Muscle Exposed: No Joint Exposed: Yes Bone Exposed: Yes Periwound Skin Texture Texture Color No Abnormalities Noted: Yes No Abnormalities Noted: Yes Moisture Temperature / Pain No Abnormalities Noted: Yes Temperature: No Abnormality Treatment Notes Wound #10 (Toe Second) Wound Laterality:  Left Cleanser Soap and Water Discharge Instruction: Megan shower and wash wound with dial antibacterial soap and water prior to dressing change. Wound Cleanser Discharge Instruction: Cleanse the wound with wound cleanser prior to applying a clean dressing using gauze sponges, not tissue or cotton balls. Peri-Wound Care Topical Primary Dressing Promogran Prisma Matrix, 4.34 (sq in) (silver collagen) Discharge Instruction: Moisten collagen with saline or hydrogel Secondary Dressing Woven Gauze Sponge, Non-Sterile 4x4 in [place], Jeanine [name] ([id number]) [id number] Page 9 of 18 Discharge Instruction: Apply over primary dressing as directed. Secured With 66M Medipore H Soft Cloth Surgical T ape, 4 x 10 (in/yd) Discharge Instruction: Secure with tape as directed. Compression Wrap Compression Stockings Add-Ons Electronic Signature(s) Signed: 05/05/2023 2:02:59 PM By: Brenton Grills Entered By: Brenton Grills on 04/30/2023 15:10:47 -------------------------------------------------------------------------------- Wound Assessment Details Patient Name: Date of Service: [name]. 04/30/2023 2:30 PM Medical Record Number: [id number] Patient Account Number: [id number] Date of Birth/Sex: Treating RN: [name] (89 y.o. [name] Primary Care Gaetano Romberger: [name] Other Clinician: Referring Dorean Daniello: Treating Oryan Winterton/Extender: Claudie Leach Weeks in Treatment: 7 Wound Status Wound Number: 11 Primary Pressure Ulcer Etiology: Wound Location: Right T Second oe Wound Open Wounding Event: Pressure Injury Status: Date Acquired: 10/09/2022 Comorbid Cataracts, Anemia, Arrhythmia, Congestive Heart Failure, Weeks Of Treatment: 7 History: Hypotension, Peripheral Venous Disease, Osteomyelitis, Clustered Wound: No Neuropathy Photos Wound Measurements Length: (cm) 0.8 Width: (cm) 0.8 Depth: (cm) 0.1 Area: (cm) 0.503 Volume: (cm) 0.05 %  Reduction in Area: 28.9% % Reduction in Volume: 29.6% Epithelialization: None Tunneling: No Undermining: No Wound Description Classification: Category/Stage IV Wound Margin: Distinct, outline attached Exudate Amount: Medium Exudate Type: Serosanguineous Exudate Color: red, brown Foul Odor After Cleansing: No Slough/Fibrino Yes Wound Bed Granulation Amount: Large (67-100%) Exposed Structure Granulation Quality: Red Fascia Exposed: No Necrotic Amount: Small (1-33%) Fat Layer (Subcutaneous Tissue) Exposed: Yes Necrotic Quality: Adherent Slough Tendon Exposed: No Schlie, Minnah [name] ([id number]) [id number] Page 10 of 18 Muscle Exposed: No Joint Exposed: Yes Bone Exposed: Yes Periwound Skin Texture Texture Color No Abnormalities Noted: Yes No Abnormalities Noted: Yes Moisture Temperature / Pain No Abnormalities Noted: Yes Temperature: No Abnormality Tenderness on Palpation: Yes Treatment Notes Wound #11 (Toe Second) Wound Laterality: Right Cleanser Soap and Water Discharge Instruction: Megan shower and wash wound with dial antibacterial soap and water prior to dressing change. Wound Cleanser Discharge Instruction: Cleanse the wound with wound cleanser prior to applying a clean dressing using gauze sponges, not tissue or cotton balls. Peri-Wound Care Topical Primary Dressing Promogran Prisma Matrix, 4.34 (sq in) (silver collagen) Discharge Instruction: Moisten collagen with saline or hydrogel Secondary Dressing Woven Gauze Sponge, Non-Sterile 4x4 in Discharge Instruction: Apply over primary dressing as directed. Secured With 66M Medipore H Soft Cloth Surgical T ape, 4 x 10 (in/yd) Discharge Instruction: Secure with tape as directed. Compression Wrap Compression Stockings Add-Ons Electronic Signature(s) Signed: 05/05/2023 2:02:59 PM By: Brenton Grills Entered By: Brenton Grills on 04/30/2023  15:11:29 -------------------------------------------------------------------------------- Wound Assessment Details Patient  Name: Date of Service: [name], [name] 04/30/2023 2:30 PM Medical Record Number: [id number] Patient Account Number: [id number] Date of Birth/Sex: Treating RN: [name] (89 y.o. 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 T Great oe Wound Open Wounding Event: Gradually Appeared Status: Date Acquired: 03/17/2023 Comorbid Cataracts, Anemia, Arrhythmia, Congestive Heart Failure, Weeks Of Treatment: 6 History: Hypotension, Peripheral Venous Disease, Osteomyelitis, Clustered Wound: No Neuropathy Photos [place], [name] ([id number]) [id number] Page 11 of 18 Wound Measurements Length: (cm) 0.5 Width: (cm) 0.5 Depth: (cm) 0.1 Area: (cm) 0.196 Volume: (cm) 0.02 % Reduction in Area: 60.4% % Reduction in Volume: 59.2% Epithelialization: None Tunneling: No Undermining: No Wound Description Classification: Full Thickness Without Exposed Support Structures Wound Margin: Flat and Intact Exudate Amount: Small Exudate Type: Purulent Exudate Color: yellow, brown, green Foul Odor After Cleansing: No Slough/Fibrino Yes Wound Bed Granulation Amount: Small (1-33%) Exposed Structure Granulation Quality: Pink Fascia Exposed: No Necrotic Amount: Large (67-100%) Fat Layer (Subcutaneous Tissue) Exposed: Yes Necrotic Quality: Adherent Slough Tendon Exposed: No Muscle Exposed: No Joint Exposed: No Bone Exposed: No Periwound Skin Texture Texture Color No Abnormalities Noted: Yes No Abnormalities Noted: No Erythema: No Moisture No Abnormalities Noted: Yes Temperature / Pain Temperature: No Abnormality Tenderness on  Palpation: Yes Treatment Notes Wound #12 (Toe Great) Wound Laterality: Left Cleanser Soap and Water Discharge Instruction: Megan shower and wash wound with dial antibacterial soap and water prior to dressing change. Wound Cleanser Discharge Instruction: Cleanse the wound with wound cleanser prior to applying a clean dressing using gauze sponges, not tissue or cotton balls. Peri-Wound Care Topical Primary Dressing Promogran Prisma Matrix, 4.34 (sq in) (silver collagen) Discharge Instruction: Moisten collagen with saline or hydrogel Secondary Dressing Woven Gauze Sponge, Non-Sterile 4x4 in Discharge Instruction: Apply over primary dressing as directed. Secured With 37M Medipore H Soft Cloth Surgical T ape, 4 x 10 (in/yd) Discharge Instruction: Secure with tape as directed. Compression Wrap Compression Stockings LEASHA, KAROW ([id number]) [id number] Page 12 of 18 Add-Ons Electronic Signature(s) Signed: 05/05/2023 2:02:59 PM By: Brenton Grills Entered By: Brenton Grills on 04/30/2023 15:12:10 -------------------------------------------------------------------------------- Wound Assessment Details Patient Name: Date of Service: KAMISHA, FINSETH 04/30/2023 2:30 PM Medical Record Number: [id number] Patient Account Number: [id number] Date of Birth/Sex: Treating RN: [birth year] [name] (89 y.o. [name] Primary Care Byron Tipping: [name] Other Clinician: Referring Keyonta Barradas: Treating Fredi Hurtado/Extender: Claudie Leach Weeks in Treatment: 7 Wound Status Wound Number: 13 Primary Arterial Insufficiency Ulcer Etiology: Wound Location: Left, Dorsal T Great oe Wound Open Wounding Event: Gradually Appeared Status: Date Acquired: 03/24/2023 Comorbid Cataracts, Anemia, Arrhythmia, Congestive Heart Failure, Weeks Of Treatment: 5 History: Hypotension, Peripheral Venous Disease, Osteomyelitis, Clustered Wound: No Neuropathy Photos Wound  Measurements Length: (cm) 0.6 Width: (cm) 0.9 Depth: (cm) 0.1 Area: (cm) 0.424 Volume: (cm) 0.042 % Reduction in Area: -436.7% % Reduction in Volume: -425% Epithelialization: None Tunneling: No Undermining: No Wound Description Classification: Full Thickness Without Exposed Suppo Wound Margin: Distinct, outline attached Exudate Amount: Medium Exudate Type: Serosanguineous Exudate Color: red, brown rt Structures Foul Odor After Cleansing: No Slough/Fibrino Yes Wound Bed Granulation Amount: Small (1-33%) Exposed Structure Granulation Quality: Red Fat Layer (Subcutaneous Tissue) Exposed: Yes Necrotic Amount: Large (67-100%) Necrotic Quality: Eschar, Adherent Slough Periwound Skin Texture Texture Color No Abnormalities Noted: Yes No Abnormalities Noted: No Erythema: Yes Moisture Erythema Location: Circumferential No Abnormalities  Noted: Yes Temperature / Pain Temperature: No Abnormality Tenderness on Palpation: Yes Poyser, Leonela [name] ([id number]) [id number] Page 13 of 18 Treatment Notes Wound #13 (Toe Great) Wound Laterality: Dorsal, Left Cleanser Soap and Water Discharge Instruction: Megan shower and wash wound with dial antibacterial soap and water prior to dressing change. Wound Cleanser Discharge Instruction: Cleanse the wound with wound cleanser prior to applying a clean dressing using gauze sponges, not tissue or cotton balls. Peri-Wound Care Topical Primary Dressing Promogran Prisma Matrix, 4.34 (sq in) (silver collagen) Discharge Instruction: Moisten collagen with saline or hydrogel Secondary Dressing Woven Gauze Sponge, Non-Sterile 4x4 in Discharge Instruction: Apply over primary dressing as directed. Secured With 35M Medipore H Soft Cloth Surgical T ape, 4 x 10 (in/yd) Discharge Instruction: Secure with tape as directed. Compression Wrap Compression Stockings Add-Ons Electronic Signature(s) Signed: 05/05/2023 2:02:59 PM By: Brenton Grills Entered By: Brenton Grills on 04/30/2023 15:12:57 -------------------------------------------------------------------------------- Wound Assessment Details Patient Name: Date of Service: [name]. 04/30/2023 2:30 PM Medical Record Number: [id number] Patient Account Number: [id number] Date of Birth/Sex: Treating RN: [birth year] (89 y.o. [name] Primary Care Faatima Tench: [name] Other Clinician: Referring Amritpal Shropshire: Treating Satonya Lux/Extender: Claudie Leach Weeks in Treatment: 7 Wound Status Wound Number: 14 Primary Arterial Insufficiency Ulcer Etiology: Wound Location: Left T Third oe Wound Open Wounding Event: Gradually Appeared Status: Date Acquired: 03/24/2023 Comorbid Cataracts, Anemia, Arrhythmia, Congestive Heart Failure, Weeks Of Treatment: 5 History: Hypotension, Peripheral Venous Disease, Osteomyelitis, Clustered Wound: No Neuropathy Photos Trindade, [name] ([id number]) [id number] Page 14 of 18 Wound Measurements Length: (cm) 0.4 Width: (cm) 0.4 Depth: (cm) 0.1 Area: (cm) 0.126 Volume: (cm) 0.013 % Reduction in Area: 0% % Reduction in Volume: 0% Epithelialization: None Tunneling: No Undermining: No Wound Description Classification: Full Thickness With Exposed Supp Wound Margin: Distinct, outline attached Exudate Amount: Medium Exudate Type: Serosanguineous Exudate Color: red, brown ort Structures Foul Odor After Cleansing: No Slough/Fibrino Yes Wound Bed Granulation Amount: Large (67-100%) Exposed Structure Granulation Quality: Red Fat Layer (Subcutaneous Tissue) Exposed: Yes Necrotic Amount: Small (1-33%) Bone Exposed: Yes Necrotic Quality: Adherent Slough Periwound Skin Texture Texture Color No Abnormalities Noted: No No Abnormalities Noted: No Scarring: Yes Hemosiderin Staining: Yes Moisture Temperature / Pain No Abnormalities Noted: No Temperature: No Abnormality Dry / Scaly:  No Tenderness on Palpation: Yes Maceration: No Treatment Notes Wound #14 (Toe Third) Wound Laterality: Left Cleanser Soap and Water Discharge Instruction: Megan shower and wash wound with dial antibacterial soap and water prior to dressing change. Wound Cleanser Discharge Instruction: Cleanse the wound with wound cleanser prior to applying a clean dressing using gauze sponges, not tissue or cotton balls. Peri-Wound Care Topical Primary Dressing Promogran Prisma Matrix, 4.34 (sq in) (silver collagen) Discharge Instruction: Moisten collagen with saline or hydrogel Secondary Dressing Woven Gauze Sponge, Non-Sterile 4x4 in Discharge Instruction: Apply over primary dressing as directed. Secured With 35M Medipore H Soft Cloth Surgical T ape, 4 x 10 (in/yd) Discharge Instruction: Secure with tape as directed. Compression Wrap Compression Stockings Add-Ons Electronic Signature(s) Signed: 05/05/2023 2:02:59 PM By: Brenton Grills Entered By: Brenton Grills on 04/30/2023 15:13:59 Hilton, [name] ([id number]) [id number] Page 15 of 18 -------------------------------------------------------------------------------- Wound Assessment Details Patient Name: Date of Service: [name], [name] 04/30/2023 2:30 PM Medical Record Number: [id number] Patient Account Number: [id number] Date of Birth/Sex: Treating RN: [name] (89 y.o. [name] Primary Care Veroncia Jezek: [name] Other Clinician: Referring Willet Schleifer: Treating Dona Klemann/Extender: Claudie Leach Weeks in Treatment: 7 Wound Status Wound Number:  15 Primary Arterial Insufficiency Ulcer Etiology: Wound Location: Right, Dorsal T Great oe Wound Open Wounding Event: Gradually Appeared Status: Date Acquired: 03/26/2023 Comorbid Cataracts, Anemia, Arrhythmia, Congestive Heart Failure, Weeks Of Treatment: 5 History: Hypotension, Peripheral Venous Disease, Osteomyelitis, Clustered Wound: No  Neuropathy Photos Wound Measurements Length: (cm) 0.5 Width: (cm) 0.5 Depth: (cm) 0.1 Area: (cm) 0.196 Volume: (cm) 0.02 % Reduction in Area: -176.1% % Reduction in Volume: -185.7% Epithelialization: Small (1-33%) Tunneling: No Undermining: No Wound Description Classification: Full Thickness Without Exposed Support Structures Wound Margin: Flat and Intact Exudate Amount: Medium Exudate Type: Serosanguineous Exudate Color: red, brown Foul Odor After Cleansing: No Slough/Fibrino No Wound Bed Granulation Amount: Large (67-100%) Exposed Structure Granulation Quality: Red Fascia Exposed: No Necrotic Amount: Small (1-33%) Fat Layer (Subcutaneous Tissue) Exposed: Yes Necrotic Quality: Adherent Slough Tendon Exposed: No Muscle Exposed: No Joint Exposed: No Bone Exposed: No Periwound Skin Texture Texture Color No Abnormalities Noted: Yes No Abnormalities Noted: Yes Moisture Temperature / Pain No Abnormalities Noted: Yes Temperature: No Abnormality Tenderness on Palpation: Yes Treatment Notes Wound #15 (Toe Great) Wound Laterality: Dorsal, Right Cleanser Soap and Water Discharge Instruction: Megan shower and wash wound with dial antibacterial soap and water prior to dressing change. Wound Cleanser Discharge Instruction: Cleanse the wound with wound cleanser prior to applying a clean dressing using gauze sponges, not tissue or cotton balls. Peri-Wound Care JONEL, WHIPP ([id number]) [id number] Page 16 of 18 Topical Primary Dressing Promogran Prisma Matrix, 4.34 (sq in) (silver collagen) Discharge Instruction: Moisten collagen with saline or hydrogel Secondary Dressing Woven Gauze Sponge, Non-Sterile 4x4 in Discharge Instruction: Apply over primary dressing as directed. Secured With 53M Medipore H Soft Cloth Surgical T ape, 4 x 10 (in/yd) Discharge Instruction: Secure with tape as directed. Compression Wrap Compression  Stockings Add-Ons Electronic Signature(s) Signed: 05/05/2023 2:02:59 PM By: Brenton Grills Entered By: Brenton Grills on 04/30/2023 15:14:38 -------------------------------------------------------------------------------- Wound Assessment Details Patient Name: Date of Service: [name]. 04/30/2023 2:30 PM Medical Record Number: [id number] Patient Account Number: [id number] Date of Birth/Sex: Treating RN: [name] (89 y.o. [name] Primary Care Deloras Reichard: [name] Other Clinician: Referring Jessye Imhoff: Treating Trinna Kunst/Extender: Claudie Leach Weeks in Treatment: 7 Wound Status Wound Number: 9 Primary Pressure Ulcer Etiology: Wound Location: Left Calcaneus Wound Open Wounding Event: Pressure Injury Status: Date Acquired: 10/22/2022 Comorbid Cataracts, Anemia, Arrhythmia, Congestive Heart Failure, Weeks Of Treatment: 7 History: Hypotension, Peripheral Venous Disease, Osteomyelitis, Clustered Wound: No Neuropathy Photos Wound Measurements Length: (cm) 0.4 Width: (cm) 0.4 Depth: (cm) 0.1 Area: (cm) 0.126 Volume: (cm) 0.013 % Reduction in Area: 61.8% % Reduction in Volume: 86.9% Epithelialization: Small (1-33%) Tunneling: No Undermining: No Wound Description Classification: Category/Stage II Wound Margin: Distinct, outline attached Exudate Amount: Small [name], [name] ([id number]) Exudate Type: Serous Exudate Color: amber Foul Odor After Cleansing: No Slough/Fibrino Yes [phone].pdf Page 17 of 18 Wound Bed Granulation Amount: Small (1-33%) Exposed Structure Granulation Quality: Pink, Pale Fascia Exposed: No Necrotic Amount: Large (67-100%) Fat Layer (Subcutaneous Tissue) Exposed: Yes Tendon Exposed: No Muscle Exposed: No Joint Exposed: No Bone Exposed: No Periwound Skin Texture Texture Color No Abnormalities Noted: Yes No Abnormalities Noted: Yes Moisture Temperature / Pain No Abnormalities Noted:  Yes Temperature: No Abnormality Tenderness on Palpation: Yes Treatment Notes Wound #9 (Calcaneus) Wound Laterality: Left Cleanser Soap and Water Discharge Instruction: Megan shower and wash wound with dial antibacterial soap and water prior to dressing change. Wound Cleanser Discharge Instruction: Cleanse the wound with wound cleanser prior to applying a  clean dressing using gauze sponges, not tissue or cotton balls. Peri-Wound Care Topical Primary Dressing Promogran Prisma Matrix, 4.34 (sq in) (silver collagen) Discharge Instruction: Moisten collagen with saline or hydrogel Secondary Dressing ALLEVYN Heel 4 1/2in x 5 1/2in / 10.5cm x 13.5cm Discharge Instruction: Apply over primary dressing as directed. Woven Gauze Sponge, Non-Sterile 4x4 in Discharge Instruction: Apply over primary dressing as directed. Secured With 13M Medipore H Soft Cloth Surgical T ape, 4 x 10 (in/yd) Discharge Instruction: Secure with tape as directed. Compression Wrap Compression Stockings Add-Ons Electronic Signature(s) Signed: 05/05/2023 2:02:59 PM By: Brenton Grills Entered By: Brenton Grills on 04/30/2023 15:30:13 -------------------------------------------------------------------------------- Vitals Details Patient Name: Date of Service: [name]. 04/30/2023 2:30 PM Medical Record Number: [id number] Patient Account Number: [id number] Date of Birth/Sex: Treating RN: [name] (89 y.o. F) Primary Care Merilyn Pagan: [name] Other Clinician: Referring Ginni Eichler: Treating Gailen Venne/Extender: Claudie Leach Weeks in Treatment: [address], [name] ([id number]) [id number] Page 18 of 18 Vital Signs Time Taken: 02:45 Temperature (F): 97.8 Height (in): 64 Pulse (bpm): 77 Weight (lbs): 157 Respiratory Rate (breaths/min): 18 Body Mass Index (BMI): 26.9 Blood Pressure (mmHg): 157/72 Reference Range: 80 - 120 mg / dl Electronic Signature(s) Signed: 04/30/2023  4:20:42 PM By: Dayton Scrape Entered By: Dayton Scrape on 04/30/2023 14:45:43

## 2023-05-05 NOTE — Progress Notes (Signed)
A consult was received from an ED physician for Vancomycin per pharmacy dosing.  The patient's profile has been reviewed for ht/wt/allergies/indication/available labs.   A one time order has been placed for Vancomycin 1gm IV.  Further antibiotics/pharmacy consults should be ordered by admitting physician if indicated.                       Thank you, Junita Push PharmD 05/05/2023  11:46 PM

## 2023-05-05 NOTE — ED Provider Notes (Signed)
[place] EMERGENCY DEPARTMENT AT [facility] Provider Note   CSN: [id number] Arrival date & time: 05/05/23  2229     History {Add pertinent medical, surgical, social history, OB history to HPI:1} Chief Complaint  Patient presents with   Leg Pain    Pt has hx of afib, chf, presents with left leg pain , redness, swelling , warm, nontender. Pt has mulitple toes amputated bilateral , years ago that are dressed on arrival , pt has home health at home    [name] is a 89 y.o. female.  Patient does not know why she is here.  She has no complaints.  She is oriented to person and place.  By report she was complaining of left leg pain and redness earlier today.  Patient has a history of pulmonary embolism, atrial fibrillation, anticoagulation use, CHF, hiatal hernia, previous toe amputations.  She denies any pain.  Family's not available.  She does not know why she is here she denies have any leg pain.  No known fever at home.  No chest pain, shortness of breath, abdominal pain, nausea, vomiting.   The history is provided by the patient.  Leg Pain Associated symptoms: no fever        Home Medications Prior to Admission medications   Medication Sig Start Date End Date Taking? Authorizing Provider  acetaminophen (TYLENOL) 325 MG tablet Take 2 tablets (650 mg total) by mouth every 6 (six) hours as needed for mild pain or headache. Patient taking differently: Take 650 mg by mouth every 6 (six) hours as needed for mild pain, headache or fever. 11/06/22   Almon Hercules, MD  ascorbic acid (VITAMIN C) 500 MG tablet Take 500 mg by mouth daily.    [provider]  aspirin EC 81 MG tablet Take 1 tablet (81 mg total) by mouth daily. Swallow whole. 03/04/23 03/03/24  [name], MD  atorvastatin (LIPITOR) 10 MG tablet Take 1 tablet (10 mg total) by mouth daily. 03/04/23 03/03/24  [name], MD  bumetanide (BUMEX) 0.5 MG tablet Take 0.5 mg by mouth 2 (two) times daily.     [provider]  carboxymethylcellulose (REFRESH PLUS) 0.5 % SOLN Place 1 drop into both eyes 2 (two) times daily.    [provider]  cholecalciferol (VITAMIN D3) 25 MCG (1000 UT) tablet Take 1,000 Units by mouth daily.    [provider]  diclofenac Sodium (VOLTAREN) 1 % GEL Apply 4 g topically 4 (four) times daily. To bilateral feet and knees Patient taking differently: Apply 4 g topically 4 (four) times daily as needed (pain). To bilateral feet and knees 11/06/22   Almon Hercules, MD  doxycycline (VIBRAMYCIN) 100 MG capsule Take 100 mg by mouth 2 (two) times daily. 03/30/23   [provider]  gabapentin (NEURONTIN) 300 MG capsule Take 300 mg by mouth 2 (two) times daily.     [provider]  levothyroxine (SYNTHROID) 100 MCG tablet Take 100 mcg by mouth daily before breakfast. 09/05/21   [provider]  Multiple Vitamins-Minerals (PRESERVISION/LUTEIN) CAPS Take 1 capsule by mouth 2 (two) times daily.     [provider]  OVER THE COUNTER MEDICATION Take 1 capsule by mouth at bedtime. Equate Neuriva    [provider]  pantoprazole (PROTONIX) 40 MG tablet Take 40 mg by mouth daily.    [provider]  polyethylene glycol powder (MIRALAX) 17 GM/SCOOP powder Take 17 g by mouth 2 (two)  times daily as needed for moderate constipation. 11/06/22   Almon Hercules, MD  potassium chloride SA (KLOR-CON M) 20 MEQ tablet Take 1 tablet (20 mEq total) by mouth daily. 04/02/23   Fayrene Helper, PA-C  senna-docusate (SENOKOT-S) 8.6-50 MG tablet Take 1 tablet by mouth 2 (two) times daily between meals as needed for mild constipation. Patient taking differently: Take 1 tablet by mouth at bedtime. 11/06/22   Almon Hercules, MD  spironolactone (ALDACTONE) 25 MG tablet Take 0.5 tablets (12.5 mg total) by mouth daily. 12/19/22   Alver Sorrow, NP  vitamin B-12 (CYANOCOBALAMIN) 1000 MCG tablet Take 1,000 mcg by mouth daily.    [provider]  warfarin (COUMADIN) 1 MG tablet Take 1-2 mg by mouth as directed. Take 2 tablets (2 mg) (Monday thru Friday) & Take 1 tablet (1 mg) on (Saturday & Sunday)    [provider]  zinc gluconate 50 MG tablet Take 50 mg by mouth daily.    [provider]      Allergies    Amoxicillin, Azithromycin, Codeine, Erythromycin, Green dyes, Iodine, Misc. sulfonamide containing compounds, Oxycodone, Oxycodone-acetaminophen, Penicillins, Sulfa antibiotics, Sulfasalazine, and Tramadol    Review of Systems   Review of Systems  Constitutional:  Negative for activity change, appetite change and fever.  HENT:  Negative for congestion.   Respiratory:  Negative for chest tightness.   Gastrointestinal:  Negative for abdominal pain, nausea and vomiting.  Genitourinary:  Negative for dysuria and hematuria.  Musculoskeletal:  Positive for arthralgias and myalgias.  Skin:  Positive for wound.  Neurological:  Negative for dizziness, weakness and headaches.   all other systems are negative except as noted in the HPI and PMH.    Physical Exam Updated Vital Signs BP (!) 146/63   Pulse 77   Temp 98.1 F (36.7 C) (Oral)   Resp (!) 31   SpO2 99%  Physical Exam Vitals and nursing note reviewed.  Constitutional:      General: She is not in acute distress.    Appearance: She is well-developed.  HENT:     Head: Normocephalic and atraumatic.     Mouth/Throat:     Pharynx: No oropharyngeal exudate.  Eyes:     Conjunctiva/sclera: Conjunctivae normal.     Pupils: Pupils are equal, round, and reactive to light.  Neck:     Comments: No meningismus. Cardiovascular:     Rate and Rhythm: Tachycardia present. Rhythm irregular.     Heart sounds: Normal heart sounds. No murmur heard. Pulmonary:     Effort: Pulmonary effort is normal. No respiratory distress.     Breath sounds: Normal breath sounds.  Abdominal:     Palpations: Abdomen is soft.     Tenderness: There is no abdominal  tenderness. There is no guarding or rebound.  Musculoskeletal:        General: No tenderness. Normal range of motion.     Cervical back: Normal range of motion and neck supple.     Right lower leg: Edema present.  Skin:    General: Skin is warm.     Findings: Erythema and rash present.     Comments: Multiple toe amputations as depicted.  Scattered wounds and erythema to dorsal feet as depicted.  Streaking erythema overlying left medial lower leg without fluctuance or crepitus. Intact DP and PT pulses.  Neurological:     Mental Status: She is alert and oriented to person, place, and time.     Cranial Nerves:  No cranial nerve deficit.     Motor: No abnormal muscle tone.     Coordination: Coordination normal.     Comments:  5/5 strength throughout. CN 2-12 intact.Equal grip strength.   Psychiatric:        Behavior: Behavior normal.          ED Results / Procedures / Treatments   Labs (all labs ordered are listed, but only abnormal results are displayed) Labs Reviewed  CULTURE, BLOOD (ROUTINE X 2)  CULTURE, BLOOD (ROUTINE X 2)  LACTIC ACID, PLASMA  LACTIC ACID, PLASMA  CBC WITH DIFFERENTIAL/PLATELET  COMPREHENSIVE METABOLIC PANEL  SEDIMENTATION RATE  C-REACTIVE PROTEIN  URINALYSIS, ROUTINE W REFLEX MICROSCOPIC    EKG None  Radiology No results found.  Procedures Procedures  {Document cardiac monitor, telemetry assessment procedure when appropriate:1}  Medications Ordered in ED Medications  ceFEPIme (MAXIPIME) 1 g in sodium chloride 0.9 % 100 mL IVPB (has no administration in time range)  lactated ringers bolus 1,000 mL (has no administration in time range)    ED Course/ Medical Decision Making/ A&P   {   Click here for ABCD2, HEART and other calculatorsREFRESH Note before signing :1}                              Medical Decision Making Amount and/or Complexity of Data Reviewed Labs: ordered. Decision-making details documented in ED Course. Radiology:  ordered and independent interpretation performed. Decision-making details documented in ED Course. ECG/medicine tests: ordered and independent interpretation performed. Decision-making details documented in ED Course.   Apparent left lower leg cellulitis.  Patient denies complaints.  Vitals are stable.  No distress.  {Document critical care time when appropriate:1} {Document review of labs and clinical decision tools ie heart score, Chads2Vasc2 etc:1}  {Document your independent review of radiology images, and any outside records:1} {Document your discussion with family members, caretakers, and with consultants:1} {Document social determinants of health affecting pt's care:1} {Document your decision making why or why not admission, treatments were needed:1} Final Clinical Impression(s) / ED Diagnoses Final diagnoses:  None    Rx / DC Orders ED Discharge Orders     None

## 2023-05-06 ENCOUNTER — Encounter (HOSPITAL_COMMUNITY): Payer: Self-pay | Admitting: Family Medicine

## 2023-05-06 ENCOUNTER — Other Ambulatory Visit: Payer: Self-pay

## 2023-05-06 DIAGNOSIS — Z7901 Long term (current) use of anticoagulants: Secondary | ICD-10-CM | POA: Diagnosis not present

## 2023-05-06 DIAGNOSIS — N952 Postmenopausal atrophic vaginitis: Secondary | ICD-10-CM | POA: Diagnosis present

## 2023-05-06 DIAGNOSIS — Z66 Do not resuscitate: Secondary | ICD-10-CM | POA: Diagnosis present

## 2023-05-06 DIAGNOSIS — I5032 Chronic diastolic (congestive) heart failure: Secondary | ICD-10-CM | POA: Diagnosis present

## 2023-05-06 DIAGNOSIS — E222 Syndrome of inappropriate secretion of antidiuretic hormone: Secondary | ICD-10-CM | POA: Diagnosis present

## 2023-05-06 DIAGNOSIS — I4821 Permanent atrial fibrillation: Secondary | ICD-10-CM | POA: Diagnosis present

## 2023-05-06 DIAGNOSIS — N3000 Acute cystitis without hematuria: Secondary | ICD-10-CM | POA: Diagnosis not present

## 2023-05-06 DIAGNOSIS — D62 Acute posthemorrhagic anemia: Secondary | ICD-10-CM | POA: Diagnosis present

## 2023-05-06 DIAGNOSIS — I130 Hypertensive heart and chronic kidney disease with heart failure and stage 1 through stage 4 chronic kidney disease, or unspecified chronic kidney disease: Secondary | ICD-10-CM | POA: Diagnosis present

## 2023-05-06 DIAGNOSIS — Z87891 Personal history of nicotine dependence: Secondary | ICD-10-CM | POA: Diagnosis not present

## 2023-05-06 DIAGNOSIS — E039 Hypothyroidism, unspecified: Secondary | ICD-10-CM | POA: Diagnosis present

## 2023-05-06 DIAGNOSIS — N390 Urinary tract infection, site not specified: Secondary | ICD-10-CM | POA: Diagnosis present

## 2023-05-06 DIAGNOSIS — K219 Gastro-esophageal reflux disease without esophagitis: Secondary | ICD-10-CM | POA: Diagnosis present

## 2023-05-06 DIAGNOSIS — L03116 Cellulitis of left lower limb: Secondary | ICD-10-CM | POA: Diagnosis present

## 2023-05-06 DIAGNOSIS — S40021A Contusion of right upper arm, initial encounter: Secondary | ICD-10-CM | POA: Diagnosis present

## 2023-05-06 DIAGNOSIS — Z7982 Long term (current) use of aspirin: Secondary | ICD-10-CM | POA: Diagnosis not present

## 2023-05-06 DIAGNOSIS — I739 Peripheral vascular disease, unspecified: Secondary | ICD-10-CM | POA: Diagnosis present

## 2023-05-06 DIAGNOSIS — D6832 Hemorrhagic disorder due to extrinsic circulating anticoagulants: Secondary | ICD-10-CM | POA: Diagnosis present

## 2023-05-06 DIAGNOSIS — G629 Polyneuropathy, unspecified: Secondary | ICD-10-CM | POA: Diagnosis present

## 2023-05-06 DIAGNOSIS — I2724 Chronic thromboembolic pulmonary hypertension: Secondary | ICD-10-CM | POA: Diagnosis present

## 2023-05-06 DIAGNOSIS — F0390 Unspecified dementia without behavioral disturbance: Secondary | ICD-10-CM | POA: Diagnosis present

## 2023-05-06 DIAGNOSIS — Z79890 Hormone replacement therapy (postmenopausal): Secondary | ICD-10-CM | POA: Diagnosis not present

## 2023-05-06 DIAGNOSIS — Z881 Allergy status to other antibiotic agents status: Secondary | ICD-10-CM | POA: Diagnosis not present

## 2023-05-06 DIAGNOSIS — E871 Hypo-osmolality and hyponatremia: Secondary | ICD-10-CM | POA: Diagnosis not present

## 2023-05-06 DIAGNOSIS — I350 Nonrheumatic aortic (valve) stenosis: Secondary | ICD-10-CM | POA: Diagnosis present

## 2023-05-06 DIAGNOSIS — N1832 Chronic kidney disease, stage 3b: Secondary | ICD-10-CM | POA: Diagnosis present

## 2023-05-06 LAB — IRON AND TIBC
Iron: 39 ug/dL (ref 28–170)
Saturation Ratios: 18 % (ref 10.4–31.8)
TIBC: 211 ug/dL — ABNORMAL LOW (ref 250–450)
UIBC: 172 ug/dL

## 2023-05-06 LAB — URINALYSIS, ROUTINE W REFLEX MICROSCOPIC
Bilirubin Urine: NEGATIVE
Glucose, UA: NEGATIVE mg/dL
Hgb urine dipstick: NEGATIVE
Ketones, ur: NEGATIVE mg/dL
Nitrite: NEGATIVE
Protein, ur: NEGATIVE mg/dL
Specific Gravity, Urine: 1.004 — ABNORMAL LOW (ref 1.005–1.030)
WBC, UA: >50 WBC/hpf (ref 0–5)
pH: 7 (ref 5.0–8.0)

## 2023-05-06 LAB — OSMOLALITY: Osmolality: 270 mOsm/kg — ABNORMAL LOW (ref 275–295)

## 2023-05-06 LAB — VITAMIN B12: Vitamin B-12: 630 pg/mL (ref 180–914)

## 2023-05-06 LAB — CBC
HCT: 23.5 % — ABNORMAL LOW (ref 36.0–46.0)
Hemoglobin: 7.7 g/dL — ABNORMAL LOW (ref 12.0–15.0)
MCH: 31.4 pg (ref 26.0–34.0)
MCHC: 32.8 g/dL (ref 30.0–36.0)
MCV: 95.9 fL (ref 80.0–100.0)
Platelets: 183 10*3/uL (ref 150–400)
RBC: 2.45 MIL/uL — ABNORMAL LOW (ref 3.87–5.11)
RDW: 14.6 % (ref 11.5–15.5)
WBC: 9.5 10*3/uL (ref 4.0–10.5)
nRBC: 0 % (ref 0.0–0.2)

## 2023-05-06 LAB — PROTIME-INR
INR: 4 — ABNORMAL HIGH (ref 0.8–1.2)
Prothrombin Time: 39.2 seconds — ABNORMAL HIGH (ref 11.4–15.2)

## 2023-05-06 LAB — RETICULOCYTES
Immature Retic Fract: 14.3 % (ref 2.3–15.9)
RBC.: 2.58 MIL/uL — ABNORMAL LOW (ref 3.87–5.11)
Retic Count, Absolute: 51.1 10*3/uL (ref 19.0–186.0)
Retic Ct Pct: 2 % (ref 0.4–3.1)

## 2023-05-06 LAB — FERRITIN: Ferritin: 97 ng/mL (ref 11–307)

## 2023-05-06 LAB — OSMOLALITY, URINE: Osmolality, Ur: 131 mOsm/kg — ABNORMAL LOW (ref 300–900)

## 2023-05-06 LAB — BASIC METABOLIC PANEL
Anion gap: 9 (ref 5–15)
BUN: 30 mg/dL — ABNORMAL HIGH (ref 8–23)
CO2: 28 mmol/L (ref 22–32)
Calcium: 8.6 mg/dL — ABNORMAL LOW (ref 8.9–10.3)
Chloride: 90 mmol/L — ABNORMAL LOW (ref 98–111)
Creatinine, Ser: 1.18 mg/dL — ABNORMAL HIGH (ref 0.44–1.00)
GFR, Estimated: 43 mL/min — ABNORMAL LOW (ref >60)
Glucose, Bld: 106 mg/dL — ABNORMAL HIGH (ref 70–99)
Potassium: 3.5 mmol/L (ref 3.5–5.1)
Sodium: 127 mmol/L — ABNORMAL LOW (ref 135–145)

## 2023-05-06 LAB — C-REACTIVE PROTEIN: CRP: 6.2 mg/dL — ABNORMAL HIGH (ref <1.0)

## 2023-05-06 LAB — FOLATE: Folate: 16.8 ng/mL (ref >5.9)

## 2023-05-06 LAB — SEDIMENTATION RATE: Sed Rate: 84 mm/h — ABNORMAL HIGH (ref 0–22)

## 2023-05-06 LAB — TSH: TSH: 1.854 u[IU]/mL (ref 0.350–4.500)

## 2023-05-06 LAB — LACTIC ACID, PLASMA: Lactic Acid, Venous: 0.9 mmol/L (ref 0.5–1.9)

## 2023-05-06 LAB — SODIUM, URINE, RANDOM: Sodium, Ur: 22 mmol/L

## 2023-05-06 MED ORDER — PANTOPRAZOLE SODIUM 40 MG PO TBEC
40.0000 mg | DELAYED_RELEASE_TABLET | Freq: Every day | ORAL | Status: DC
  Administered 2023-05-06 – 2023-05-12 (×7): 40 mg via ORAL
  Filled 2023-05-06 (×7): qty 1

## 2023-05-06 MED ORDER — ONDANSETRON HCL 4 MG PO TABS: 4.0000 mg | ORAL_TABLET | Freq: Four times a day (QID) | ORAL | Status: DC | PRN

## 2023-05-06 MED ORDER — LEVOTHYROXINE SODIUM 100 MCG PO TABS
100.0000 ug | ORAL_TABLET | Freq: Every day | ORAL | Status: DC
  Administered 2023-05-06 – 2023-05-12 (×6): 100 ug via ORAL
  Filled 2023-05-06 (×6): qty 1

## 2023-05-06 MED ORDER — POLYETHYLENE GLYCOL 3350 17 G PO PACK
17.0000 g | PACK | Freq: Every day | ORAL | Status: DC | PRN
  Administered 2023-05-07: 17 g via ORAL
  Filled 2023-05-06: qty 1

## 2023-05-06 MED ORDER — POLYVINYL ALCOHOL 1.4 % OP SOLN: 1.0000 [drp] | Freq: Two times a day (BID) | OPHTHALMIC | Status: DC | PRN

## 2023-05-06 MED ORDER — WARFARIN - PHARMACIST DOSING INPATIENT: Freq: Every day | Status: DC

## 2023-05-06 MED ORDER — GABAPENTIN 300 MG PO CAPS
300.0000 mg | ORAL_CAPSULE | Freq: Two times a day (BID) | ORAL | Status: DC
  Administered 2023-05-06 – 2023-05-12 (×13): 300 mg via ORAL
  Filled 2023-05-06 (×13): qty 1

## 2023-05-06 MED ORDER — ACETAMINOPHEN 325 MG PO TABS
650.0000 mg | ORAL_TABLET | Freq: Four times a day (QID) | ORAL | Status: DC | PRN
  Administered 2023-05-06 – 2023-05-08 (×5): 650 mg via ORAL
  Filled 2023-05-06 (×5): qty 2

## 2023-05-06 MED ORDER — ONDANSETRON HCL 4 MG/2ML IJ SOLN: 4.0000 mg | Freq: Four times a day (QID) | INTRAMUSCULAR | Status: DC | PRN

## 2023-05-06 MED ORDER — SPIRONOLACTONE 12.5 MG HALF TABLET
12.5000 mg | ORAL_TABLET | Freq: Every day | ORAL | Status: DC
  Administered 2023-05-06 – 2023-05-07 (×2): 12.5 mg via ORAL
  Filled 2023-05-06 (×3): qty 1

## 2023-05-06 MED ORDER — HYDRALAZINE HCL 20 MG/ML IJ SOLN: 5.0000 mg | INTRAMUSCULAR | Status: DC | PRN

## 2023-05-06 MED ORDER — DOCUSATE SODIUM 100 MG PO CAPS
100.0000 mg | ORAL_CAPSULE | Freq: Two times a day (BID) | ORAL | Status: DC
  Administered 2023-05-06 – 2023-05-12 (×11): 100 mg via ORAL
  Filled 2023-05-06 (×11): qty 1

## 2023-05-06 MED ORDER — VANCOMYCIN HCL IN DEXTROSE 1-5 GM/200ML-% IV SOLN: 1000.0000 mg | INTRAVENOUS | Status: DC

## 2023-05-06 MED ORDER — ASPIRIN 81 MG PO TBEC
81.0000 mg | DELAYED_RELEASE_TABLET | Freq: Every day | ORAL | Status: DC
  Administered 2023-05-06 – 2023-05-12 (×7): 81 mg via ORAL
  Filled 2023-05-06 (×7): qty 1

## 2023-05-06 MED ORDER — ACETAMINOPHEN 650 MG RE SUPP: 650.0000 mg | Freq: Four times a day (QID) | RECTAL | Status: DC | PRN

## 2023-05-06 MED ORDER — CLOTRIMAZOLE 1 % VA CREA
1.0000 | TOPICAL_CREAM | Freq: Once | VAGINAL | Status: AC
  Administered 2023-05-06: 1 via VAGINAL
  Filled 2023-05-06: qty 45

## 2023-05-06 MED ORDER — SODIUM CHLORIDE 0.9 % IV SOLN
2.0000 g | INTRAVENOUS | Status: DC
  Administered 2023-05-06: 2 g via INTRAVENOUS
  Filled 2023-05-06: qty 12.5

## 2023-05-06 MED ORDER — ATORVASTATIN CALCIUM 10 MG PO TABS
10.0000 mg | ORAL_TABLET | Freq: Every day | ORAL | Status: DC
  Administered 2023-05-06 – 2023-05-12 (×7): 10 mg via ORAL
  Filled 2023-05-06 (×6): qty 1

## 2023-05-06 MED ORDER — ESTRADIOL 0.1 MG/GM VA CREA
1.0000 | TOPICAL_CREAM | Freq: Every day | VAGINAL | Status: DC
  Administered 2023-05-06 – 2023-05-11 (×5): 1 via VAGINAL
  Filled 2023-05-06: qty 42.5

## 2023-05-06 MED ORDER — BUMETANIDE 0.5 MG PO TABS
0.5000 mg | ORAL_TABLET | Freq: Two times a day (BID) | ORAL | Status: DC
  Administered 2023-05-06 – 2023-05-07 (×4): 0.5 mg via ORAL
  Filled 2023-05-06 (×5): qty 1

## 2023-05-06 MED ORDER — ALBUTEROL SULFATE (2.5 MG/3ML) 0.083% IN NEBU: 2.5000 mg | INHALATION_SOLUTION | RESPIRATORY_TRACT | Status: DC | PRN

## 2023-05-06 NOTE — ED Notes (Signed)
While changing pt bed and brief, bruising to the upper left arm underneath the blood pressure cuff was noted. Blood pressure cuff was removed and swelling and bruising was found underneath the BP cuff. Applied ice pack.

## 2023-05-06 NOTE — Plan of Care (Signed)

## 2023-05-06 NOTE — Progress Notes (Signed)
ANTICOAGULATION CONSULT NOTE - Initial Consult  Pharmacy Consult for Warfarin Indication:  hx VTE  & afib  Allergies  Allergen Reactions   Amoxicillin Other (See Comments)    Unknown reaction  Tolerates Keflex, Cefepime   Azithromycin Other (See Comments)    Unknown reaction    Codeine Other (See Comments)    Unknown reaction    Erythromycin Other (See Comments)    Unknown reaction    Green Dyes Other (See Comments)    Allergic to ALL dyes   Iodine Other (See Comments)    Unknown reaction    Misc. Sulfonamide Containing Compounds    Oxycodone Other (See Comments)    Unknown reaction      Oxycodone-Acetaminophen Other (See Comments)    Unknown reaction    Penicillins Other (See Comments)    Unknown reaction  Tolerates Keflex, Cefepime   Sulfa Antibiotics     Per Pt's daughter - unknown reaction    Sulfasalazine     Unsure of allergy    Tramadol Hives    Patient Measurements:    Vital Signs: Temp: 98.1 F (36.7 C) (08/27 2249) Temp Source: Oral (08/27 2249) BP: 167/61 (08/28 0000) Pulse Rate: 66 (08/28 0000)  Labs: Recent Labs    05/05/23 2255  HGB 8.2*  HCT 25.3*  PLT 205  CREATININE 1.29*    CrCl cannot be calculated (Unknown ideal weight.).   Medical History: Past Medical History:  Diagnosis Date   Anticoagulant long-term use    Arrhythmia    Arthritis    Atrial fibrillation (HCC)    Congestive heart failure (CHF) (HCC)    Dysphagia    Esophageal reflux    Essential hypertension    Hiatal hernia    Pulmonary embolus (HCC)    Thyroid disease     Medications:  Warfarin 1mg  on MWF and 2mg  on TuThSaSu.  Last dose 8/26 ~10p.  Assessment: 62 yoF on chronic warfarin for afib + hx VTE.  Her INR is 4.0- elevated on admission despite no warfarin in past 24h.  She is admitted with cellulitis and started on broad-spectrum antibiotics which could interact to further increase INR.  Scr slightly elevated above baseline.  CBC: Hg low at 8.2 on  admission.  No overt bleeding noted.   Goal of Therapy:  INR 2-3   Plan:  Hold warfarin today for elevated INR F/U daily PT/INR  Junita Push PharmD 05/06/2023,2:47 AM

## 2023-05-06 NOTE — Progress Notes (Addendum)
Pharmacy Antibiotic Note  [name] is a 89 y.o. female admitted on 05/05/2023 with cellulitis.  Pharmacy has been consulted for Cefepime + Vancomycin dosing.  Baseline Scr ~1.1 Weight from 6/24 = 72.6kg, estimated CrCl ~53ml/min  Plan: Cefepime 2gm IV q24h Vancomycin 1250mg  IV q24h to target AUC 400-550. Estimated AUC on this regimen =  500. Monitor renal function and cx data      Temp (24hrs), Avg:98.1 F (36.7 C), Min:98.1 F (36.7 C), Max:98.1 F (36.7 C)  Recent Labs  Lab 05/05/23 2255 05/05/23 2348  WBC 8.1  --   CREATININE 1.29*  --   LATICACIDVEN  --  0.9    CrCl cannot be calculated (Unknown ideal weight.).    Allergies  Allergen Reactions   Amoxicillin Other (See Comments)    Unknown reaction  Tolerates Keflex, Cefepime   Azithromycin Other (See Comments)    Unknown reaction    Codeine Other (See Comments)    Unknown reaction    Erythromycin Other (See Comments)    Unknown reaction    Green Dyes Other (See Comments)    Allergic to ALL dyes   Iodine Other (See Comments)    Unknown reaction    Misc. Sulfonamide Containing Compounds    Oxycodone Other (See Comments)    Unknown reaction      Oxycodone-Acetaminophen Other (See Comments)    Unknown reaction    Penicillins Other (See Comments)    Unknown reaction  Tolerates Keflex, Cefepime   Sulfa Antibiotics     Per Pt's daughter - unknown reaction    Sulfasalazine     Unsure of allergy    Tramadol Hives    Antimicrobials this admission: 8/27 Cefepime >>  8/28 Vancomycin >>   Dose adjustments this admission:  Microbiology results: 8/27 BCx:   Thank you for allowing pharmacy to be a part of this patient's care.  Junita Push PharmD 05/06/2023 1:59 AM

## 2023-05-06 NOTE — ED Notes (Signed)
ED TO INPATIENT HANDOFF REPORT  Name/Age/Gender [name] 89 y.o. female  Code Status    Code Status Orders  (From admission, onward)           Start     Ordered   05/06/23 0725  Do not attempt resuscitation (DNR)  Continuous       Question Answer Comment  If patient has no pulse and is not breathing Do Not Attempt Resuscitation   If patient has a pulse and/or is breathing: Medical Treatment Goals LIMITED ADDITIONAL INTERVENTIONS: Use medication/IV fluids and cardiac monitoring as indicated; Do not use intubation or mechanical ventilation (DNI), also provide comfort medications.  Transfer to Progressive/Stepdown as indicated, avoid Intensive Care.   Consent: Discussion documented in EHR or advanced directives reviewed      05/06/23 0726           Code Status History     Date Active Date Inactive Code Status Order ID Comments User Context   05/06/2023 0531 05/06/2023 0726 DNR [id number]  Gery Pray, MD ED   05/06/2023 0531 05/06/2023 0531 Full Code [id number]  Gery Pray, MD ED   03/04/2023 1648 03/04/2023 2332 Full Code [id number]  [name], MD Inpatient   01/15/2023 2244 01/21/2023 2055 DNR [id number]  Therisa Doyne, MD ED   10/23/2022 2352 11/07/2022 2012 DNR [id number]  Hillary Bow, DO ED   11/16/2021 1034 11/21/2021 2153 DNR [id number]  Glade Lloyd, MD Inpatient   11/16/2021 0141 11/16/2021 1034 Full Code [id number]  Charissa Bash, NP Inpatient   11/15/2021 2231 11/16/2021 0141 DNR [id number]  Synetta Fail, MD ED   11/15/2021 2041 11/15/2021 2231 Full Code [id number]  Synetta Fail, MD ED       Home/SNF/Other Home  Chief Complaint Cellulitis of leg, left [L03.116]  Level of Care/Admitting Diagnosis ED Disposition     ED Disposition  Admit   Condition  --   Comment  Hospital Area: [facility] [[id number]]  Level of Care: Med-Surg [16]  May admit patient to [facility] or [facility] if equivalent level of care is  available:: Yes  Covid Evaluation: Asymptomatic - no recent exposure (last 10 days) testing not required  Diagnosis: Cellulitis of leg, left [188416]  Admitting Physician: Gery Pray [[id number]]  Attending Physician: Gery Pray [[id number]]  Certification:: I certify this patient will need inpatient services for at least 2 midnights  Expected Medical Readiness: 05/08/2023          Medical History Past Medical History:  Diagnosis Date   Anticoagulant long-term use    Arrhythmia    Arthritis    Atrial fibrillation (HCC)    Congestive heart failure (CHF) (HCC)    Dysphagia    Esophageal reflux    Essential hypertension    Hiatal hernia    Pulmonary embolus (HCC)    Thyroid disease     Allergies Allergies  Allergen Reactions   Amoxicillin Other (See Comments)    Unknown reaction  Tolerates Keflex, Cefepime   Azithromycin Other (See Comments)    Unknown reaction    Codeine Other (See Comments)    Unknown reaction    Erythromycin Other (See Comments)    Unknown reaction    Green Dyes Other (See Comments)    Allergic to ALL dyes   Iodine Other (See Comments)    Unknown reaction    Misc. Sulfonamide Containing Compounds    Oxycodone Other (See Comments)    Unknown reaction  Oxycodone-Acetaminophen Other (See Comments)    Unknown reaction    Penicillins Other (See Comments)    Unknown reaction  Tolerates Keflex, Cefepime   Sulfa Antibiotics     Per Pt's daughter - unknown reaction    Sulfasalazine     Unsure of allergy    Tramadol Hives    IV Location/Drains/Wounds Patient Lines/Drains/Airways Status     Active Line/Drains/Airways     Name Placement date Placement time Site Days   Peripheral IV 05/05/23 20 G Left Antecubital 05/05/23  2244  Antecubital  1   Sheath 03/04/23 Right Femoral;Arterial 03/04/23  1358  Femoral;Arterial  63   Pressure Injury 01/16/23 Heel Left Stage 3 -  Full thickness tissue loss. Subcutaneous fat may be visible but bone, tendon  or muscle are NOT exposed. 01/16/23  0022  -- 110   Pressure Injury 01/16/23 Heel Right Deep Tissue Pressure Injury - Purple or maroon localized area of discolored intact skin or blood-filled blister due to damage of underlying soft tissue from pressure and/or shear. 01/16/23  0023  -- 110   Pressure Injury 01/16/23 Sacrum Deep Tissue Pressure Injury - Purple or maroon localized area of discolored intact skin or blood-filled blister due to damage of underlying soft tissue from pressure and/or shear. 01/16/23  0025  -- 110            Labs/Imaging Results for orders placed or performed during the hospital encounter of 05/05/23 (from the past 48 hour(s))  Blood culture (routine x 2)     Status: None (Preliminary result)   Collection Time: 05/05/23 10:55 PM   Specimen: BLOOD  Result Value Ref Range   Specimen Description      BLOOD LEFT ANTECUBITAL Performed at [facility], [address] [place], [place] [address]    Special Requests      BOTTLES DRAWN AEROBIC AND ANAEROBIC Blood Culture results may not be optimal due to an excessive volume of blood received in culture bottles Performed at [facility], [address] [place], [place] [address]    Culture      NO GROWTH < 12 HOURS Performed at [facility], [address] [place], [place] [address]    Report Status PENDING   CBC with Differential     Status: Abnormal   Collection Time: 05/05/23 10:55 PM  Result Value Ref Range   WBC 8.1 4.0 - 10.5 K/uL   RBC 2.65 (L) 3.87 - 5.11 MIL/uL   Hemoglobin 8.2 (L) 12.0 - 15.0 g/dL   HCT 29.5 (L) 28.4 - 13.2 %   MCV 95.5 80.0 - 100.0 fL   MCH 30.9 26.0 - 34.0 pg   MCHC 32.4 30.0 - 36.0 g/dL   RDW 44.0 10.2 - 72.5 %   Platelets 205 150 - 400 K/uL   nRBC 0.0 0.0 - 0.2 %   Neutrophils Relative % 65 %   Neutro Abs 5.4 1.7 - 7.7 K/uL   Lymphocytes Relative 19 %   Lymphs Abs 1.5 0.7 - 4.0 K/uL   Monocytes Relative 10 %   Monocytes Absolute  0.8 0.1 - 1.0 K/uL   Eosinophils Relative 4 %   Eosinophils Absolute 0.4 0.0 - 0.5 K/uL   Basophils Relative 1 %   Basophils Absolute 0.1 0.0 - 0.1 K/uL   Immature Granulocytes 1 %   Abs Immature Granulocytes 0.04 0.00 - 0.07 K/uL    Comment: Performed at [facility], [address] [place], [place] [address]  Comprehensive metabolic panel     Status: Abnormal   Collection Time: 05/05/23 10:55 PM  Result Value Ref Range   Sodium 125 (L) 135 - 145 mmol/L   Potassium 3.6 3.5 - 5.1 mmol/L   Chloride 88 (L) 98 - 111 mmol/L   CO2 27 22 - 32 mmol/L   Glucose, Bld 93 70 - 99 mg/dL    Comment: Glucose reference range applies only to samples taken after fasting for at least 8 hours.   BUN 29 (H) 8 - 23 mg/dL   Creatinine, Ser 0.86 (H) 0.44 - 1.00 mg/dL   Calcium 8.5 (L) 8.9 - 10.3 mg/dL   Total Protein 6.0 (L) 6.5 - 8.1 g/dL   Albumin 2.8 (L) 3.5 - 5.0 g/dL   AST 29 15 - 41 U/L   ALT 15 0 - 44 U/L   Alkaline Phosphatase 122 38 - 126 U/L   Total Bilirubin 0.8 0.3 - 1.2 mg/dL   GFR, Estimated 39 (L) >60 mL/min    Comment: (NOTE) Calculated using the CKD-EPI Creatinine Equation (2021)    Anion gap 10 5 - 15    Comment: Performed at [facility], [address], [place], [place] [address]  Sedimentation rate     Status: Abnormal   Collection Time: 05/05/23 10:55 PM  Result Value Ref Range   Sed Rate 84 (H) 0 - 22 mm/hr    Comment: Performed at [facility], [address] [place], [place] [address]  C-reactive protein     Status: Abnormal   Collection Time: 05/05/23 10:55 PM  Result Value Ref Range   CRP 6.2 (H) <1.0 mg/dL    Comment: Performed at [facility], [address] [place], [place] [address]  Osmolality     Status: Abnormal   Collection Time: 05/05/23 10:55 PM  Result Value Ref Range   Osmolality 270 (L) 275 - 295 mOsm/kg    Comment: Performed at [facility], [address], [place], [place]  [address]  TSH     Status: None   Collection Time: 05/05/23 10:55 PM  Result Value Ref Range   TSH 1.854 0.350 - 4.500 uIU/mL    Comment: Performed by a 3rd Generation assay with a functional sensitivity of <=0.01 uIU/mL. Performed at [facility], [address] [place], [place] [address]   Protime-INR     Status: Abnormal   Collection Time: 05/05/23 10:55 PM  Result Value Ref Range   Prothrombin Time 39.2 (H) 11.4 - 15.2 seconds   INR 4.0 (H) 0.8 - 1.2    Comment: (NOTE) INR goal varies based on device and disease states. Performed at [facility], [address] [place], [place] [address]   Lactic acid, plasma     Status: None   Collection Time: 05/05/23 11:48 PM  Result Value Ref Range   Lactic Acid, Venous 0.9 0.5 - 1.9 mmol/L    Comment: Performed at [facility], [address], [place], [place] [address]  Blood culture (routine x 2)     Status: None (Preliminary result)   Collection Time: 05/05/23 11:59 PM   Specimen: BLOOD LEFT ARM  Result Value Ref Range   Specimen Description      BLOOD LEFT ARM Performed at [facility], [address], [place], [place] [address]    Special Requests      BOTTLES DRAWN AEROBIC AND  ANAEROBIC Blood Culture adequate volume Performed at [facility], [address], [place], [place] [address]    Culture      NO GROWTH < 12 HOURS Performed at [facility], [address], [place], [place] [address]    Report Status PENDING   Vitamin B12     Status: None   Collection Time: 05/06/23  3:43 AM  Result Value Ref Range   Vitamin B-12 630 180 - 914 pg/mL    Comment: (NOTE) This assay is not validated for testing neonatal or myeloproliferative syndrome specimens for Vitamin B12 levels. Performed at [facility], [address] [place], [place] [address]   Folate     Status: None   Collection Time: 05/06/23  3:43 AM  Result Value  Ref Range   Folate 16.8 >5.9 ng/mL    Comment: Performed at [facility], [address], [place], [place] [address]  Iron and TIBC     Status: Abnormal   Collection Time: 05/06/23  3:43 AM  Result Value Ref Range   Iron 39 28 - 170 ug/dL   TIBC 010 (L) 272 - 536 ug/dL   Saturation Ratios 18 10.4 - 31.8 %   UIBC 172 ug/dL    Comment: Performed at [facility], [address], [place], [place] [address]  Ferritin     Status: None   Collection Time: 05/06/23  3:43 AM  Result Value Ref Range   Ferritin 97 11 - 307 ng/mL    Comment: Performed at [facility], [address], [place], [place] [address]  Reticulocytes     Status: Abnormal   Collection Time: 05/06/23  3:43 AM  Result Value Ref Range   Retic Ct Pct 2.0 0.4 - 3.1 %   RBC. 2.58 (L) 3.87 - 5.11 MIL/uL   Retic Count, Absolute 51.1 19.0 - 186.0 K/uL   Immature Retic Fract 14.3 2.3 - 15.9 %    Comment: Performed at [facility], [address] [place], [place] [address]  Sodium, urine, random     Status: None   Collection Time: 05/06/23  4:15 AM  Result Value Ref Range   Sodium, Ur 22 mmol/L    Comment: Performed at [facility], [address], [place], [place] [address]  Osmolality, urine     Status: Abnormal   Collection Time: 05/06/23  4:15 AM  Result Value Ref Range   Osmolality, Ur 131 (L) 300 - 900 mOsm/kg    Comment: Performed at [facility], [address] [place], [place] [address]  Urinalysis, Routine w reflex microscopic -Urine, Clean Catch     Status: Abnormal   Collection Time: 05/06/23  4:47 AM  Result Value Ref Range   Color, Urine YELLOW YELLOW   APPearance CLOUDY (A) CLEAR   Specific Gravity, Urine 1.004 (L) 1.005 - 1.030   pH 7.0 5.0 - 8.0   Glucose, UA NEGATIVE NEGATIVE mg/dL   Hgb urine dipstick NEGATIVE NEGATIVE   Bilirubin Urine NEGATIVE NEGATIVE   Ketones, ur NEGATIVE NEGATIVE mg/dL   Protein,  ur NEGATIVE NEGATIVE mg/dL   Nitrite NEGATIVE NEGATIVE   Leukocytes,Ua LARGE (A) NEGATIVE   RBC / HPF 21-50 0 - 5 RBC/hpf   WBC, UA >50 0 - 5 WBC/hpf   Bacteria, UA RARE (A) NONE SEEN   Squamous Epithelial / HPF 0-5 0 - 5 /HPF   WBC Clumps PRESENT  Comment: Performed at [facility], [address], [place], [place] [address]  Basic metabolic panel     Status: Abnormal   Collection Time: 05/06/23  7:40 AM  Result Value Ref Range   Sodium 127 (L) 135 - 145 mmol/L   Potassium 3.5 3.5 - 5.1 mmol/L   Chloride 90 (L) 98 - 111 mmol/L   CO2 28 22 - 32 mmol/L   Glucose, Bld 106 (H) 70 - 99 mg/dL    Comment: Glucose reference range applies only to samples taken after fasting for at least 8 hours.   BUN 30 (H) 8 - 23 mg/dL   Creatinine, Ser 4.01 (H) 0.44 - 1.00 mg/dL   Calcium 8.6 (L) 8.9 - 10.3 mg/dL   GFR, Estimated 43 (L) >60 mL/min    Comment: (NOTE) Calculated using the CKD-EPI Creatinine Equation (2021)    Anion gap 9 5 - 15    Comment: Performed at [facility], [address] [place], [place] [address]  CBC     Status: Abnormal   Collection Time: 05/06/23  7:40 AM  Result Value Ref Range   WBC 9.5 4.0 - 10.5 K/uL   RBC 2.45 (L) 3.87 - 5.11 MIL/uL   Hemoglobin 7.7 (L) 12.0 - 15.0 g/dL   HCT 36.6 (L) 44.0 - 34.7 %   MCV 95.9 80.0 - 100.0 fL   MCH 31.4 26.0 - 34.0 pg   MCHC 32.8 30.0 - 36.0 g/dL   RDW 42.5 95.6 - 38.7 %   Platelets 183 150 - 400 K/uL   nRBC 0.0 0.0 - 0.2 %    Comment: Performed at [facility], [address] [place], [place] [address]   DG Tibia/Fibula Left  Result Date: 05/05/2023 CLINICAL DATA:  Left lower extremity pain and swelling, initial encounter EXAM: LEFT TIBIA AND FIBULA - 2 VIEW COMPARISON:  None Available. FINDINGS: Degenerative changes about the left knee joint are seen. Soft tissue calcifications noted. No acute fracture or dislocation is noted. IMPRESSION: Degenerative change without  acute abnormality. Electronically Signed   By: Alcide Clever M.D.   On: 05/05/2023 23:52   DG Foot Complete Left  Result Date: 05/05/2023 CLINICAL DATA:  Left foot pain and swelling, initial encounter EXAM: LEFT FOOT - COMPLETE 3+ VIEW COMPARISON:  11/04/2022 FINDINGS: No acute fracture or dislocation is noted. Hammertoe deformity is noted in the first toe. No soft tissue abnormality is noted. Mild tarsal degenerative changes are seen. IMPRESSION: Degenerative change without acute abnormality. Electronically Signed   By: Alcide Clever M.D.   On: 05/05/2023 23:52   DG Foot Complete Right  Result Date: 05/05/2023 CLINICAL DATA:  Left lower leg redness and swelling with multiple ulcers. EXAM: RIGHT FOOT COMPLETE - 3+ VIEW COMPARISON:  01/29/2022. 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 Osteopenia is noted. No acute fracture or dislocation is seen. Hallux valgus deformity and degenerative changes are present in the first metatarsophalangeal joint. Degenerative changes are present in the midfoot. There is a large calcaneal spur. No periosteal elevation or bony erosions are seen. Vascular calcifications are noted in the soft tissues. IMPRESSION: 1. No acute osseous abnormality. 2. Amputation of the second digit. 3. Scattered degenerative changes. Electronically Signed   By: Thornell Sartorius M.D.   On: 05/05/2023 23:51   DG Chest Portable 1 View  Result Date: 05/05/2023 CLINICAL DATA:  Sepsis, short of breath, atrial fibrillation EXAM: PORTABLE CHEST 1 VIEW COMPARISON:  04/02/2023 FINDINGS: Single  frontal view of the chest demonstrates stable enlarged cardiac silhouette. There is chronic central vascular congestion without acute airspace disease. Trace right pleural effusion unchanged. No pneumothorax. No acute bony abnormalities. IMPRESSION: 1. Stable enlarged cardiac silhouette. 2. Stable central vascular congestion without overt edema. 3. Stable trace right pleural effusion. Electronically Signed    By: Sharlet Salina M.D.   On: 05/05/2023 23:50    Pending Labs Unresulted Labs (From admission, onward)     Start     Ordered   05/07/23 0500  Protime-INR  Daily,   R      05/06/23 0325   05/07/23 0500  Basic metabolic panel  Tomorrow morning,   R        05/06/23 0726   05/07/23 0500  CBC  Tomorrow morning,   R        05/06/23 0726   05/07/23 0500  Protime-INR  Tomorrow morning,   R        05/06/23 0726   Unscheduled  Occult blood card to lab, stool  As needed,   R      05/06/23 0343            Vitals/Pain Today's Vitals   05/06/23 0322 05/06/23 0542 05/06/23 0845 05/06/23 1152  BP:  (!) 158/48 (!) 141/62 (!) 150/82  Pulse:  88 79 86  Resp:  (!) 25 18 18   Temp: 98.1 F (36.7 C) 98.7 F (37.1 C) 97.8 F (36.6 C) 98.2 F (36.8 C)  TempSrc: Oral Oral Oral Oral  SpO2:  100% 97% 99%  PainSc:        Isolation Precautions No active isolations  Medications Medications  levothyroxine (SYNTHROID) tablet 100 mcg (100 mcg Oral Given 05/06/23 0615)  spironolactone (ALDACTONE) tablet 12.5 mg (12.5 mg Oral Given 05/06/23 1041)  ceFEPIme (MAXIPIME) 2 g in sodium chloride 0.9 % 100 mL IVPB (has no administration in time range)  vancomycin (VANCOCIN) IVPB 1000 mg/200 mL premix (has no administration in time range)  Warfarin - Pharmacist Dosing Inpatient (0 each Does not apply Hold 05/06/23 1600)  hydrALAZINE (APRESOLINE) injection 5 mg (has no administration in time range)  acetaminophen (TYLENOL) tablet 650 mg (has no administration in time range)    Or  acetaminophen (TYLENOL) suppository 650 mg (has no administration in time range)  ondansetron (ZOFRAN) tablet 4 mg (has no administration in time range)    Or  ondansetron (ZOFRAN) injection 4 mg (has no administration in time range)  aspirin EC tablet 81 mg (81 mg Oral Given 05/06/23 1040)  atorvastatin (LIPITOR) tablet 10 mg (10 mg Oral Given 05/06/23 1040)  bumetanide (BUMEX) tablet 0.5 mg (0.5 mg Oral Given 05/06/23 1046)   pantoprazole (PROTONIX) EC tablet 40 mg (40 mg Oral Given 05/06/23 1041)  estradiol (ESTRACE) vaginal cream 1 Applicatorful (has no administration in time range)  gabapentin (NEURONTIN) capsule 300 mg (300 mg Oral Given 05/06/23 1041)  polyvinyl alcohol (LIQUIFILM TEARS) 1.4 % ophthalmic solution 1 drop (has no administration in time range)  docusate sodium (COLACE) capsule 100 mg (100 mg Oral Given 05/06/23 1040)  polyethylene glycol (MIRALAX / GLYCOLAX) packet 17 g (has no administration in time range)  albuterol (PROVENTIL) (2.5 MG/3ML) 0.083% nebulizer solution 2.5 mg (has no administration in time range)  ceFEPIme (MAXIPIME) 2 g in sodium chloride 0.9 % 100 mL IVPB (0 g Intravenous Stopped 05/06/23 0020)  lactated ringers bolus 1,000 mL (0 mLs Intravenous Stopped 05/06/23 0149)  vancomycin (VANCOCIN) IVPB 1000 mg/200 mL premix (0  mg Intravenous Stopped 05/06/23 0405)  clotrimazole (GYNE-LOTRIMIN) vaginal cream 1 Applicatorful (1 Applicatorful Vaginal Given 05/06/23 5409)    Mobility walks with person assist

## 2023-05-06 NOTE — ED Notes (Signed)
Pt found with bruising to RUE. Per night shift RN, this was due to blood pressure cuff

## 2023-05-06 NOTE — H&P (Signed)
History and Physical  [name] ZOX:[id number] DOB: 04-Jun-1928 DOA: 05/05/2023  PCP: Camie Patience, FNP   Chief Complaint: Leg redness  HPI: [name] is a 89 y.o. female with medical history significant for PE on Coumadin, atrial fibrillation, congestive heart failure, aortic stenosis, hypertension being admitted to the hospital with a left lower extremity cellulitis.  Patient lives with her daughter who takes good care of her, currently is followed by wound care center for bilateral lower extremity wounds.  Daughter brought her to the ER yesterday as there was concern for tracking erythema up the left leg, which started yesterday.  No reported fever, nausea, change in mental status.  However patient is unable to tell me why she is in the hospital and does not wish to be admitted.  ED Course: In the emergency department, she is a little hypertensive but otherwise vitals are unremarkable.  She is saturating well on room air.  Lab work shows stable hyponatremia, slightly elevated creatinine of 1.29, normal white blood cell count, and hemoglobin of 8.2 down from baseline of about 10.  Patient's INR is elevated at 4.  She denies any abdominal pain, bloody stools as far as she knows.  She was given empiric IV cefepime, IV vancomycin and hospitalist was contacted for admission.  Review of Systems: Please see HPI for pertinent positives and negatives. A complete 10 system review of systems are otherwise negative.  Past Medical History:  Diagnosis Date   Anticoagulant long-term use    Arrhythmia    Arthritis    Atrial fibrillation (HCC)    Congestive heart failure (CHF) (HCC)    Dysphagia    Esophageal reflux    Essential hypertension    Hiatal hernia    Pulmonary embolus (HCC)    Thyroid disease    Past Surgical History:  Procedure Laterality Date   ABDOMINAL AORTOGRAM W/LOWER EXTREMITY N/A 03/04/2023   Procedure: ABDOMINAL AORTOGRAM W/LOWER EXTREMITY;  Surgeon: [name], MD;   Location: [facility] INVASIVE CV LAB;  Service: Cardiovascular;  Laterality: N/A;   AMPUTATION TOE Right 11/17/2021   Procedure: RIGHT SECOND TOE AMPUTATION;  Surgeon: [name], MD;  Location: [facility];  Service: Orthopedics;  Laterality: Right;   COLONOSCOPY     ELBOW SURGERY     LAPAROSCOPIC HYSTERECTOMY      Social History:  reports that she quit smoking about 21 years ago. Her smoking use included cigarettes. She has never used smokeless tobacco. No history on file for alcohol use and drug use.   Allergies  Allergen Reactions   Amoxicillin Other (See Comments)    Unknown reaction  Tolerates Keflex, Cefepime   Azithromycin Other (See Comments)    Unknown reaction    Codeine Other (See Comments)    Unknown reaction    Erythromycin Other (See Comments)    Unknown reaction    Green Dyes Other (See Comments)    Allergic to ALL dyes   Iodine Other (See Comments)    Unknown reaction    Misc. Sulfonamide Containing Compounds    Oxycodone Other (See Comments)    Unknown reaction      Oxycodone-Acetaminophen Other (See Comments)    Unknown reaction    Penicillins Other (See Comments)    Unknown reaction  Tolerates Keflex, Cefepime   Sulfa Antibiotics     Per Pt's daughter - unknown reaction    Sulfasalazine     Unsure of allergy    Tramadol Hives    Family History  Problem Relation Age of Onset   Stroke Mother    Cancer Brother      Prior to Admission medications   Medication Sig Start Date End Date Taking? Authorizing Provider  acetaminophen (TYLENOL) 325 MG tablet Take 2 tablets (650 mg total) by mouth every 6 (six) hours as needed for mild pain or headache. Patient taking differently: Take 650 mg by mouth every 6 (six) hours as needed for mild pain, headache or fever. 11/06/22  Yes Almon Hercules, MD  ascorbic acid (VITAMIN C) 500 MG tablet Take 500 mg by mouth daily.   Yes [provider]  aspirin EC 81 MG tablet Take 1 tablet (81 mg total) by mouth daily. Swallow  whole. 03/04/23 03/03/24 Yes [name], MD  atorvastatin (LIPITOR) 10 MG tablet Take 1 tablet (10 mg total) by mouth daily. 03/04/23 03/03/24 Yes [name], MD  bumetanide (BUMEX) 0.5 MG tablet Take 0.5 mg by mouth 2 (two) times daily.   Yes [provider]  carboxymethylcellulose (REFRESH PLUS) 0.5 % SOLN Place 1 drop into both eyes 2 (two) times daily.   Yes [provider]  diclofenac Sodium (VOLTAREN) 1 % GEL Apply 4 g topically 4 (four) times daily. To bilateral feet and knees Patient taking differently: Apply 4 g topically 4 (four) times daily as needed (pain). To bilateral feet and knees 11/06/22  Yes Gonfa, Taye T, MD  estradiol (ESTRACE) 0.1 MG/GM vaginal cream Place 1 Applicatorful vaginally daily. 04/21/23  Yes [provider]  gabapentin (NEURONTIN) 300 MG capsule Take 300 mg by mouth 2 (two) times daily.    Yes [provider]  levothyroxine (SYNTHROID) 100 MCG tablet Take 100 mcg by mouth daily before breakfast. 09/05/21  Yes [provider]  Multiple Vitamins-Minerals (PRESERVISION/LUTEIN) CAPS Take 1 capsule by mouth 2 (two) times daily.    Yes [provider]  ondansetron (ZOFRAN) 4 MG tablet Take 4 mg by mouth daily as needed. 04/17/23  Yes [provider]  OVER THE COUNTER MEDICATION Take 1 capsule by mouth at bedtime. Equate Neuriva   Yes [provider]  pantoprazole (PROTONIX) 40 MG tablet Take 40 mg by mouth daily.   Yes [provider]  potassium chloride (KLOR-CON) 10 MEQ tablet Take 1 tablet by mouth daily. 04/09/23  Yes [provider]  senna-docusate (SENOKOT-S) 8.6-50 MG tablet Take 1 tablet by mouth 2 (two) times daily between meals as needed for mild constipation. Patient taking differently: Take 1 tablet by mouth at bedtime. 11/06/22  Yes Almon Hercules, MD  spironolactone (ALDACTONE) 25 MG tablet Take 0.5 tablets (12.5 mg total) by mouth daily. 12/19/22  Yes Alver Sorrow,  NP  warfarin (COUMADIN) 1 MG tablet Take 1-2 mg by mouth as directed. 1 MG (Mon,Wed,Fri) & 2 MG on (Tues,Thurs,Sat,Sun)   Yes [provider]  cholecalciferol (VITAMIN D3) 25 MCG (1000 UT) tablet Take 1,000 Units by mouth daily. Patient not taking: Reported on 05/06/2023    [provider]  doxycycline (VIBRAMYCIN) 100 MG capsule Take 100 mg by mouth 2 (two) times daily. Patient not taking: Reported on 05/06/2023 03/30/23   [provider]  fluconazole (DIFLUCAN) 150 MG tablet Take 150 mg by mouth once. Patient not taking: Reported on 05/06/2023 04/21/23   [provider]  levofloxacin (LEVAQUIN) 750 MG tablet Take 750 mg by mouth daily. Patient not taking: Reported on 05/06/2023 03/26/23   [provider]  vitamin B-12 (CYANOCOBALAMIN) 1000 MCG tablet Take  1,000 mcg by mouth daily. Patient not taking: Reported on 05/06/2023    [provider]  zinc gluconate 50 MG tablet Take 50 mg by mouth daily.    [provider]    Physical Exam: BP (!) 158/48 (BP Location: Right Arm)   Pulse 88   Temp 98.7 F (37.1 C) (Oral)   Resp (!) 25   SpO2 100%   General:  Alert, oriented, calm, in no acute distress, thin elderly female appearing her stated age, resting comfortably. Eyes: EOMI, clear conjuctivae, white sclerea Neck: supple, no masses, trachea mildline  Cardiovascular: RRR, no murmurs or rubs, no peripheral edema  Respiratory: clear to auscultation bilaterally, no wheezes, no crackles  Abdomen: soft, nontender, nondistended, normal bowel tones heard  Skin: dry, significant area and warmth without significant edema or tenderness of the left lower extremity overlying the calf Musculoskeletal: no joint effusions, normal range of motion  Psychiatric: appropriate affect, normal speech  Neurologic: extraocular muscles intact, clear speech, moving all extremities with intact sensorium           Labs on Admission:  Basic Metabolic  Panel: Recent Labs  Lab 05/05/23 2255  NA 125*  K 3.6  CL 88*  CO2 27  GLUCOSE 93  BUN 29*  CREATININE 1.29*  CALCIUM 8.5*   Liver Function Tests: Recent Labs  Lab 05/05/23 2255  AST 29  ALT 15  ALKPHOS 122  BILITOT 0.8  PROT 6.0*  ALBUMIN 2.8*   No results for input(s): "LIPASE", "AMYLASE" in the last 168 hours. No results for input(s): "AMMONIA" in the last 168 hours. CBC: Recent Labs  Lab 05/05/23 2255  WBC 8.1  NEUTROABS 5.4  HGB 8.2*  HCT 25.3*  MCV 95.5  PLT 205   Cardiac Enzymes: No results for input(s): "CKTOTAL", "CKMB", "CKMBINDEX", "TROPONINI" in the last 168 hours.  BNP (last 3 results) Recent Labs    10/23/22 2152 11/01/22 0720  BNP 373.1* 329.2*    ProBNP (last 3 results) No results for input(s): "PROBNP" in the last 8760 hours.  CBG: No results for input(s): "GLUCAP" in the last 168 hours.  Radiological Exams on Admission: DG Tibia/Fibula Left  Result Date: 05/05/2023 CLINICAL DATA:  Left lower extremity pain and swelling, initial encounter EXAM: LEFT TIBIA AND FIBULA - 2 VIEW COMPARISON:  None Available. FINDINGS: Degenerative changes about the left knee joint are seen. Soft tissue calcifications noted. No acute fracture or dislocation is noted. IMPRESSION: Degenerative change without acute abnormality. Electronically Signed   By: Alcide Clever M.D.   On: 05/05/2023 23:52   DG Foot Complete Left  Result Date: 05/05/2023 CLINICAL DATA:  Left foot pain and swelling, initial encounter EXAM: LEFT FOOT - COMPLETE 3+ VIEW COMPARISON:  11/04/2022 FINDINGS: No acute fracture or dislocation is noted. Hammertoe deformity is noted in the first toe. No soft tissue abnormality is noted. Mild tarsal degenerative changes are seen. IMPRESSION: Degenerative change without acute abnormality. Electronically Signed   By: Alcide Clever M.D.   On: 05/05/2023 23:52   DG Foot Complete Right  Result Date: 05/05/2023 CLINICAL DATA:  Left lower leg redness and  swelling with multiple ulcers. EXAM: RIGHT FOOT COMPLETE - 3+ VIEW COMPARISON:  01/29/2022. 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 Osteopenia is noted. No acute fracture or dislocation is seen. Hallux valgus deformity and degenerative changes are present in the first metatarsophalangeal joint. Degenerative changes are present in the midfoot. There is a large calcaneal spur. No periosteal elevation  or bony erosions are seen. Vascular calcifications are noted in the soft tissues. IMPRESSION: 1. No acute osseous abnormality. 2. Amputation of the second digit. 3. Scattered degenerative changes. Electronically Signed   By: Thornell Sartorius M.D.   On: 05/05/2023 23:51   DG Chest Portable 1 View  Result Date: 05/05/2023 CLINICAL DATA:  Sepsis, short of breath, atrial fibrillation EXAM: PORTABLE CHEST 1 VIEW COMPARISON:  04/02/2023 FINDINGS: Single frontal view of the chest demonstrates stable enlarged cardiac silhouette. There is chronic central vascular congestion without acute airspace disease. Trace right pleural effusion unchanged. No pneumothorax. No acute bony abnormalities. IMPRESSION: 1. Stable enlarged cardiac silhouette. 2. Stable central vascular congestion without overt edema. 3. Stable trace right pleural effusion. Electronically Signed   By: Sharlet Salina M.D.   On: 05/05/2023 23:50    Assessment/Plan [name] is a 89 y.o. female with medical history significant for PE on Coumadin, atrial fibrillation, congestive heart failure, aortic stenosis, hypertension being admitted to the hospital with a left lower extremity cellulitis.   Left lower extremity cellulitis-without evidence of sepsis -Inpatient admission -Empiric IV vancomycin and IV cefepime, per pharmacy protocol  History of bilateral lower extremity wounds-followed by wound care -Will consult wound care in-house  History of PE-INR supratherapeutic, without obvious evidence of bleeding though hemoglobin is lower than  baseline -Will continue Coumadin per pharmacy consult  Heart failure with preserved EF-patient appears euvolemic without any signs or symptoms of acute heart failure exacerbation -Continue Aldactone and Bumex  CKD stage III-with slight uptrend in creatinine -Monitor with daily labs -Consider hold nephrotoxins if creatinine continues to climb  Hyponatremia-this appears to be chronic, and asymptomatic -Will monitor with daily labs  Acute on chronic anemia-without obvious evidence of bleeding, will check stool guaiac.  Will hold Coumadin and reverse in case of evidence of acute bleeding.  Vaginal atrophy-continue topical estrogen  Peripheral neuropathy-gabapentin  DVT prophylaxis: INR supratherapeutic, being followed by pharmacy    Code Status: DNR  Consults called: None  Admission status: The appropriate patient status for this patient is INPATIENT. Inpatient status is judged to be reasonable and necessary in order to provide the required intensity of service to ensure the patient's safety. The patient's presenting symptoms, physical exam findings, and initial radiographic and laboratory data in the context of their chronic comorbidities is felt to place them at high risk for further clinical deterioration. Furthermore, it is not anticipated that the patient will be medically stable for discharge from the hospital within 2 midnights of admission.    I certify that at the point of admission it is my clinical judgment that the patient will require inpatient hospital care spanning beyond 2 midnights from the point of admission due to high intensity of service, high risk for further deterioration and high frequency of surveillance required  Time spent: 53 minutes  Mariel Gaudin Sharlette Dense MD [facility] Pager [phone]  If 7PM-7AM, please contact night-coverage [email or web address] Password [email or web address]  05/06/2023, 7:30 AM

## 2023-05-07 ENCOUNTER — Ambulatory Visit (HOSPITAL_BASED_OUTPATIENT_CLINIC_OR_DEPARTMENT_OTHER): Payer: Medicare HMO | Admitting: General Surgery

## 2023-05-07 DIAGNOSIS — N3000 Acute cystitis without hematuria: Secondary | ICD-10-CM

## 2023-05-07 DIAGNOSIS — I4821 Permanent atrial fibrillation: Secondary | ICD-10-CM | POA: Diagnosis not present

## 2023-05-07 DIAGNOSIS — L03116 Cellulitis of left lower limb: Secondary | ICD-10-CM | POA: Diagnosis not present

## 2023-05-07 LAB — BASIC METABOLIC PANEL
Anion gap: 9 (ref 5–15)
BUN: 35 mg/dL — ABNORMAL HIGH (ref 8–23)
CO2: 27 mmol/L (ref 22–32)
Calcium: 8.6 mg/dL — ABNORMAL LOW (ref 8.9–10.3)
Chloride: 95 mmol/L — ABNORMAL LOW (ref 98–111)
Creatinine, Ser: 1.16 mg/dL — ABNORMAL HIGH (ref 0.44–1.00)
GFR, Estimated: 44 mL/min — ABNORMAL LOW (ref >60)
Glucose, Bld: 87 mg/dL (ref 70–99)
Potassium: 3.6 mmol/L (ref 3.5–5.1)
Sodium: 131 mmol/L — ABNORMAL LOW (ref 135–145)

## 2023-05-07 LAB — HEMOGLOBIN AND HEMATOCRIT, BLOOD
HCT: 23.6 % — ABNORMAL LOW (ref 36.0–46.0)
Hemoglobin: 7.7 g/dL — ABNORMAL LOW (ref 12.0–15.0)

## 2023-05-07 LAB — CBC
HCT: 20.3 % — ABNORMAL LOW (ref 36.0–46.0)
Hemoglobin: 6.5 g/dL — CL (ref 12.0–15.0)
MCH: 30.7 pg (ref 26.0–34.0)
MCHC: 32 g/dL (ref 30.0–36.0)
MCV: 95.8 fL (ref 80.0–100.0)
Platelets: 170 10*3/uL (ref 150–400)
RBC: 2.12 MIL/uL — ABNORMAL LOW (ref 3.87–5.11)
RDW: 14.6 % (ref 11.5–15.5)
WBC: 6.4 10*3/uL (ref 4.0–10.5)
nRBC: 0 % (ref 0.0–0.2)

## 2023-05-07 LAB — PREPARE RBC (CROSSMATCH)

## 2023-05-07 LAB — PROTIME-INR
INR: 3.1 — ABNORMAL HIGH (ref 0.8–1.2)
Prothrombin Time: 32.1 s — ABNORMAL HIGH (ref 11.4–15.2)

## 2023-05-07 LAB — ABO/RH: ABO/RH(D): A POS

## 2023-05-07 MED ORDER — MUPIROCIN CALCIUM 2 % EX CREA
TOPICAL_CREAM | Freq: Every day | CUTANEOUS | Status: DC
  Filled 2023-05-07: qty 15

## 2023-05-07 MED ORDER — SODIUM CHLORIDE 0.9 % IV SOLN
2.0000 g | INTRAVENOUS | Status: DC
  Administered 2023-05-07 – 2023-05-08 (×2): 2 g via INTRAVENOUS
  Filled 2023-05-07 (×3): qty 2

## 2023-05-07 MED ORDER — WARFARIN SODIUM 1 MG PO TABS
1.0000 mg | ORAL_TABLET | Freq: Once | ORAL | Status: AC
  Administered 2023-05-07: 1 mg via ORAL
  Filled 2023-05-07: qty 1

## 2023-05-07 MED ORDER — SODIUM CHLORIDE 0.9% IV SOLUTION: Freq: Once | INTRAVENOUS | Status: AC

## 2023-05-07 MED ORDER — BISACODYL 10 MG RE SUPP
10.0000 mg | Freq: Every day | RECTAL | Status: AC | PRN
  Administered 2023-05-07: 10 mg via RECTAL
  Filled 2023-05-07: qty 1

## 2023-05-07 MED ORDER — SODIUM CHLORIDE 0.9 % IV SOLN: 2.0000 g | Freq: Three times a day (TID) | INTRAVENOUS | Status: DC

## 2023-05-07 MED ORDER — DOXYCYCLINE HYCLATE 100 MG PO TABS
100.0000 mg | ORAL_TABLET | Freq: Two times a day (BID) | ORAL | Status: DC
  Administered 2023-05-07 – 2023-05-12 (×10): 100 mg via ORAL
  Filled 2023-05-07 (×11): qty 1

## 2023-05-07 MED ORDER — SODIUM CHLORIDE 0.9 % IV SOLN
1.0000 g | INTRAVENOUS | Status: DC
  Administered 2023-05-07: 1 g via INTRAVENOUS
  Filled 2023-05-07: qty 10

## 2023-05-07 NOTE — Plan of Care (Signed)

## 2023-05-07 NOTE — Assessment & Plan Note (Addendum)
-   consistent with cellulitis; nonpurulent and likely moderate. Today the erythema and calor is present but improved - does have known PAD with recent LLE angiogram and angioplasty on 03/04/23 - s/p vanc/cefepime on admission - continue doxy mono for coverage - essentially is healed now; will finish total of 7 day course; completed inpatient

## 2023-05-07 NOTE — Consult Note (Addendum)
WOC Nurse Consult Note: Reason for Consult: Consult requested for bilat heels and toes. Left heel with Unstageable pressure injury; .8X.8cm, dry yellow adhered slough, no odor, drainage, or fluctuance.  Right heel is dark red but blanches, no open wound. Left and right great and 2nd anterior toes with full thickness wounds, no odor, drainage, or fluctuance, all are 90% red, 10% yellow.  Left great toe with full thickness wound; .5X.5X.1cm Left 2nd toe .3X.3X.1cm Right great toe .4X.4X.1cm Right 2nd toe .3X.3X.1cm Dressing procedure/placement/frequency: Prevalon boots ordered bilat to reduce pressure. Topical treatment orders provided for bedside nurses to perform as follows to promote moist healing: Apply Bactroban to left and right great and 2nd toes and left heel Q day, then cover with foam dressing.  Change foam dressings Q 3 days or PRN soiling. Please re-consult if further assistance is needed.  Thank-you,  Cammie Mcgee MSN, RN, CWOCN, [place], CNS [phone]

## 2023-05-07 NOTE — Assessment & Plan Note (Signed)
-   patient has history of CKD3b. Baseline creat ~ 1.1 - 1.3, eGFR~ 39 - currently at baseline

## 2023-05-07 NOTE — Assessment & Plan Note (Signed)
-   not a TAVR candidate - followed by cardiology

## 2023-05-07 NOTE — Plan of Care (Signed)
  Problem: Education: Goal: Knowledge of General Education information will improve Description: Including pain rating scale, medication(s)/side effects and non-pharmacologic comfort measures Outcome: Progressing   Problem: Clinical Measurements: Goal: Diagnostic test results will improve Outcome: Progressing   Problem: Activity: Goal: Risk for activity intolerance will decrease Outcome: Progressing   Problem: Nutrition: Goal: Adequate nutrition will be maintained Outcome: Progressing   Problem: Coping: Goal: Level of anxiety will decrease Outcome: Progressing   Problem: Pain Managment: Goal: General experience of comfort will improve Outcome: Progressing   Problem: Safety: Goal: Ability to remain free from injury will improve Outcome: Progressing

## 2023-05-07 NOTE — Progress Notes (Signed)
   05/07/23 1105  TOC Assessment  TOC screening is complete Yes  Once discharged, how will the patient get to their discharge location? Family/Friend - Photographer;Ambulance  Expected Discharge Plan Home w Home Health Services  Final next level of care Home w Home Health Services  Barriers to Discharge No Barriers Identified  Patient states their goals for this hospitalization and ongoing recovery are: none stated  CMS Medicare.gov Compare Post Acute Care list provided to: Patient  Choice offered to / list presented to  Patient;Adult Children  Post Acute Care Choice Home Health (PT eval)  Living arrangements for the past 2 months Single Family Home  Lives with: Adult Children  Current home services DME  In-house Referral NA  Discharge Planning Services NA  Do you feel safe going back to the place where you live? Y  Permission sought to share information with  Facility Medical sales representative  Permission granted to share information with  Yes, Verbal Permission Granted  Patient language and need for interpreter reviewed: No  Criminal Activity/Legal Involvement Pertinent to Current Situation/Hospitalization No - Comment as needed  Need for Family Participation in Patient Care Y  Care giver support system in place? Y  Appearance: Appears stated age  Attitude/Demeanor/Rapport Other (comment) (appropriate)  Affect (typically observed) Accepting;Calm  Orientation:  Oriented to Self;Oriented to Place;Oriented to  Time;Oriented to Situation  Alcohol / Substance Use  (previous use)  Psych Involvement N

## 2023-05-07 NOTE — Assessment & Plan Note (Signed)
Continue Synthroid °

## 2023-05-07 NOTE — Assessment & Plan Note (Signed)
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

## 2023-05-07 NOTE — Assessment & Plan Note (Signed)
-   On Coumadin at home.  INR supratherapeutic on admission with some bruising noted on right arm -Pharmacy managing Coumadin, appreciate assistance

## 2023-05-07 NOTE — Progress Notes (Signed)
[facility] - Clinic Note  05/19/2023     CHIEF COMPLAINT Patient presents for Retina Follow Up   HISTORY OF PRESENT ILLNESS: [name] is a 89 y.o. female who presents to the clinic today for:   HPI     Retina Follow Up   Patient presents with  Wet AMD.  In both eyes.  This started 4 months ago.  Duration of 4 months.  Since onset it is stable.  I, the attending physician,  performed the HPI with the patient and updated documentation appropriately.        Comments   4 month retina follow up ARMD OU pt is delayed due to being in hospital several times due to UTI potassium levels low and cellulitis pt is reporting her vision is not as sharp when reading things run together and colors are not as sharp       Last edited by Rennis Chris, MD on 05/23/2023  1:56 PM.     Pt is delayed to follow up from 2 weeks to 4 months, pt had 50 liters of fluid removed in February / March and since then she has had covid, low potassium and cellulitis, pt has reported not be able to see the color red  Referring physician: Eloisa Northern, MD [address] [place],  [place] [address]  HISTORICAL INFORMATION:   Selected notes from the MEDICAL RECORD NUMBER Referred by Dr. Karleen Hampshire for ret eval OU   CURRENT MEDICATIONS: Current Outpatient Medications (Ophthalmic Drugs)  Medication Sig   carboxymethylcellulose (REFRESH PLUS) 0.5 % SOLN Place 1 drop into both eyes 2 (two) times daily.   No current facility-administered medications for this visit. (Ophthalmic Drugs)   Current Outpatient Medications (Other)  Medication Sig   acetaminophen (TYLENOL) 325 MG tablet Take 2 tablets (650 mg total) by mouth every 6 (six) hours as needed for mild pain or headache. (Patient taking differently: Take 650 mg by mouth every 6 (six) hours as needed for mild pain, headache or fever.)   ascorbic acid (VITAMIN C) 500 MG tablet Take 500 mg by mouth daily.   aspirin EC 81 MG tablet Take 1 tablet  (81 mg total) by mouth daily. Swallow whole.   atorvastatin (LIPITOR) 10 MG tablet Take 1 tablet (10 mg total) by mouth daily.   bumetanide (BUMEX) 0.5 MG tablet Take 0.5 mg by mouth daily.   diclofenac Sodium (VOLTAREN) 1 % GEL Apply 4 g topically 4 (four) times daily. To bilateral feet and knees (Patient taking differently: Apply 4 g topically 4 (four) times daily as needed (pain). To bilateral feet and knees)   estradiol (ESTRACE) 0.1 MG/GM vaginal cream Place 1 Applicatorful vaginally daily.   gabapentin (NEURONTIN) 300 MG capsule Take 300 mg by mouth 2 (two) times daily.    levothyroxine (SYNTHROID) 100 MCG tablet Take 100 mcg by mouth daily before breakfast.   Multiple Vitamins-Minerals (PRESERVISION/LUTEIN) CAPS Take 1 capsule by mouth 2 (two) times daily.    ondansetron (ZOFRAN) 4 MG tablet Take 4 mg by mouth daily as needed.   OVER THE COUNTER MEDICATION Take 1 capsule by mouth at bedtime. Equate Neuriva   pantoprazole (PROTONIX) 40 MG tablet Take 40 mg by mouth daily.   senna-docusate (SENOKOT-S) 8.6-50 MG tablet Take 1 tablet by mouth 2 (two) times daily between meals as needed for mild constipation. (Patient taking differently: Take 1 tablet by mouth at bedtime.)   vitamin B-12 (CYANOCOBALAMIN) 1000 MCG tablet  Take 1,000 mcg by mouth daily.   warfarin (COUMADIN) 1 MG tablet Take 1-2 mg by mouth as directed. 1 MG (Mon,Wed,Fri) & 2 MG on (Tues,Thurs,Sat,Sun)   zinc gluconate 50 MG tablet Take 50 mg by mouth daily.   No current facility-administered medications for this visit. (Other)   REVIEW OF SYSTEMS: ROS   Positive for: Eyes Negative for: Constitutional, Gastrointestinal, Neurological, Skin, Genitourinary, Musculoskeletal, HENT, Endocrine, Cardiovascular, Respiratory, Psychiatric, Allergic/Imm, Heme/Lymph Last edited by Etheleen Mayhew, COT on 05/19/2023  1:07 PM.       ALLERGIES Allergies  Allergen Reactions   Amoxicillin Other (See Comments)    Unknown reaction   Tolerates Keflex, Cefepime   Azithromycin Other (See Comments)    Unknown reaction    Codeine Other (See Comments)    Unknown reaction    Erythromycin Other (See Comments)    Unknown reaction    Green Dyes Other (See Comments)    Allergic to ALL dyes   Iodine Other (See Comments)    Unknown reaction    Misc. Sulfonamide Containing Compounds    Oxycodone Other (See Comments)    Unknown reaction      Oxycodone-Acetaminophen Other (See Comments)    Unknown reaction    Penicillins Other (See Comments)    Unknown reaction  Tolerates Keflex, Cefepime   Sulfa Antibiotics     Per Pt's daughter - unknown reaction    Sulfasalazine     Unsure of allergy    Tramadol Hives   PAST MEDICAL HISTORY Past Medical History:  Diagnosis Date   Anticoagulant long-term use    Arrhythmia    Arthritis    Atrial fibrillation (HCC)    Congestive heart failure (CHF) (HCC)    Dysphagia    Esophageal reflux    Essential hypertension    Hiatal hernia    Pulmonary embolus (HCC)    Thyroid disease    Past Surgical History:  Procedure Laterality Date   ABDOMINAL AORTOGRAM W/LOWER EXTREMITY N/A 03/04/2023   Procedure: ABDOMINAL AORTOGRAM W/LOWER EXTREMITY;  Surgeon: [name], MD;  Location: [facility] INVASIVE CV LAB;  Service: Cardiovascular;  Laterality: N/A;   AMPUTATION TOE Right 11/17/2021   Procedure: RIGHT SECOND TOE AMPUTATION;  Surgeon: [name], MD;  Location: [facility] ORS;  Service: Orthopedics;  Laterality: Right;   COLONOSCOPY     ELBOW SURGERY     LAPAROSCOPIC HYSTERECTOMY     FAMILY HISTORY Family History  Problem Relation Age of Onset   Stroke Mother    Cancer Brother    SOCIAL HISTORY Social History   Tobacco Use   Smoking status: Former    Current packs/day: 0.00    Types: Cigarettes    Quit date: 10/02/2001    Years since quitting: 21.6   Smokeless tobacco: Never   Tobacco comments:    former smoker  Vaping Use   Vaping status: Never Used  Substance Use Topics    Alcohol use: Never   Drug use: Never       OPHTHALMIC EXAM:  Base Eye Exam     Visual Acuity (Snellen - Linear)       Right Left   Dist cc 20/80 -2 20/150 -2   Dist ph cc NI          Tonometry (Tonopen, 1:11 PM)       Right Left   Pressure 13 14         Pupils       Pupils Dark Light Shape React  APD   Right PERRL 4 3 Round Brisk None   Left PERRL 4 3 Round Brisk None         Visual Fields       Left Right    Full Full         Extraocular Movement       Right Left    Full, Ortho Full, Ortho         Neuro/Psych     Oriented x3: Yes   Mood/Affect: Normal         Dilation     Both eyes: 2.5% Phenylephrine @ 1:12 PM           Slit Lamp and Fundus Exam     Slit Lamp Exam       Right Left   Lids/Lashes Dermatochalasis - upper lid, mild Meibomian gland dysfunction Dermatochalasis - upper lid, mild Meibomian gland dysfunction   Conjunctiva/Sclera White and quiet White and quiet   Cornea arcus, 2-3+ Punctate epithelial erosions, irregular tear film, decreased TBUT, mild central haze, 2+guttata, 1-2+, Descemet's folds, tear film debris, well healed cataract wound arcus, 1-2+ Punctate epithelial erosions, decreased TBUT, mild central haze, 1+guttata, no edema, tear film debris   Anterior Chamber Deep and clear, narrow temporal angle Deep and clear, narrow temporal angle   Iris Round and dilated Round and dilated   Lens PC IOL in good position with open PC PC IOL in good position with open PC   Anterior Vitreous Vitreous syneresis Vitreous syneresis         Fundus Exam       Right Left   Disc 360 PPA, mld Pallor, Sharp rim mild Pallor, Sharp rim   C/D Ratio 0.3 0.3   Macula Flat, Blunted foveal reflex, RPE mottling, clumping and atrophy, Drusen, prominent GA superior and nasal to fovea -- slightly increased, No heme or edema Flat, Blunted foveal reflex, RPE mottling, clumping and atrophy, +GA -- slightly increased, No heme or edema    Vessels Vascular attenuation, mild tortuousity attenuated, Tortuous   Periphery Attached, reticular degeneration, No heme  Attached, mild reticular degeneration, No heme           Refraction     Wearing Rx       Sphere Cylinder Axis   Right -1.50 +1.50 005   Left -0.25 +1.50 175            IMAGING AND PROCEDURES  Imaging and Procedures for 05/19/2023  OCT, Retina - OU - Both Eyes       Right Eye Quality was good. Central Foveal Thickness: 282. Progression has worsened. Findings include normal foveal contour, no IRF, no SRF, retinal drusen , outer retinal atrophy (Interval progression of perifoveal ORA/GA -- seen best on en face image).   Left Eye Quality was good. Central Foveal Thickness: 333. Progression has worsened. Findings include normal foveal contour, no IRF, no SRF, retinal drusen , intraretinal hyper-reflective material, subretinal fluid, outer retinal atrophy (Mild progression of ORA/GA greatest superior and nasal mac -- seen best on en face image).   Notes *Images captured and stored on drive  Diagnosis / Impression:  Nonexudative ARMD OU -- No IRF/SRF OU OD: interval progression of perifoveal ORA/GA -- seen best on en face images OS: Mild progression of ORA/GA greatest superior and nasal mac -- seen best on en face images   Clinical management:  See below  Abbreviations: NFP - Normal foveal profile. CME - cystoid macular edema. PED - pigment  epithelial detachment. IRF - intraretinal fluid. SRF - subretinal fluid. EZ - ellipsoid zone. ERM - epiretinal membrane. ORA - outer retinal atrophy. ORT - outer retinal tubulation. SRHM - subretinal hyper-reflective material. IRHM - intraretinal hyper-reflective material            ASSESSMENT/PLAN:    ICD-10-CM   1. Advanced atrophic nonexudative age-related macular degeneration of both eyes without subfoveal involvement  H35.3133 OCT, Retina - OU - Both Eyes    2. Essential hypertension  I10     3.  Hypertensive retinopathy of both eyes  H35.033     4. Pseudophakia, both eyes  Z96.1     5. Dry eyes  H04.123      1. Age related macular degeneration, non-exudative, with +GA, both eyes (OS>OD)  - delayed f/u -- 4 mos instead of 2 wks  - focal peripapillary ORA OU  - BCVA OD 20/80 from 20/70; OS 20/150 - stable  - OCT shows Mild progression of ORA/GA greatest superior and nasal mac OU -- seen best on en face images  - Cont amsler grid monitoring  - f/u 6-9 mos, DFE, OCT  2,3. Hypertensive retinopathy OU - discussed importance of tight BP control - monitor  4. Pseudophakia OU  - s/p CE/IOL OU  - IOLs in good position  - s/p YAG Cap OD (Dr. Karleen Hampshire) - s/p YAG Cap OS (05.01.24 - Dr. Vanessa Barbara)  5. Dry eyes OU  - recommend artificial tears and lubricating ointment as needed   Ophthalmic Meds Ordered this visit:  No orders of the defined types were placed in this encounter.    Return in about 6 months (around 11/16/2023) for nonex ARMD OU, Dilated Exam, OCT.  There are no Patient Instructions on file for this visit.   Explained the diagnoses, plan, and follow up with the patient and they expressed understanding.  Patient expressed understanding of the importance of proper follow up care.  This document serves as a record of services personally performed by Karie Chimera, MD, PhD. It was created on their behalf by Laurey Morale, COT an ophthalmic technician. The creation of this record is the provider's dictation and/or activities during the visit.    Electronically signed by:  Charlette Caffey, COT  05/23/23 2:39 PM  Karie Chimera, M.D., Ph.D. Diseases & Surgery of the Retina and Vitreous [facility]  I have reviewed the above documentation for accuracy and completeness, and I agree with the above. Karie Chimera, M.D., Ph.D. 05/23/23 2:41 PM  Abbreviations: M myopia (nearsighted); A astigmatism; H hyperopia (farsighted); P presbyopia; Mrx  spectacle prescription;  CTL contact lenses; OD right eye; OS left eye; OU both eyes  XT exotropia; ET esotropia; PEK punctate epithelial keratitis; PEE punctate epithelial erosions; DES dry eye syndrome; MGD meibomian gland dysfunction; ATs artificial tears; PFAT's preservative free artificial tears; NSC nuclear sclerotic cataract; PSC posterior subcapsular cataract; ERM epi-retinal membrane; PVD posterior vitreous detachment; RD retinal detachment; DM diabetes mellitus; DR diabetic retinopathy; NPDR non-proliferative diabetic retinopathy; PDR proliferative diabetic retinopathy; CSME clinically significant macular edema; DME diabetic macular edema; dbh dot blot hemorrhages; CWS cotton wool spot; POAG primary open angle glaucoma; C/D cup-to-disc ratio; HVF humphrey visual field; GVF goldmann visual field; OCT optical coherence tomography; IOP intraocular pressure; BRVO Branch retinal vein occlusion; CRVO central retinal vein occlusion; CRAO central retinal artery occlusion; BRAO branch retinal artery occlusion; RT retinal tear; SB scleral buckle; PPV pars plana vitrectomy; VH Vitreous hemorrhage; PRP  panretinal laser photocoagulation; IVK intravitreal kenalog; VMT vitreomacular traction; MH Macular hole;  NVD neovascularization of the disc; NVE neovascularization elsewhere; AREDS age related eye disease study; ARMD age related macular degeneration; POAG primary open angle glaucoma; EBMD epithelial/anterior basement membrane dystrophy; ACIOL anterior chamber intraocular lens; IOL intraocular lens; PCIOL posterior chamber intraocular lens; Phaco/IOL phacoemulsification with intraocular lens placement; PRK photorefractive keratectomy; LASIK laser assisted in situ keratomileusis; HTN hypertension; DM diabetes mellitus; COPD chronic obstructive pulmonary disease

## 2023-05-07 NOTE — Progress Notes (Addendum)
Progress Note    [name]   WNU:[id number]  DOB: 08-23-1928  DOA: 05/05/2023     1 PCP: Megan Patience, FNP  Initial CC: left leg redness  Hospital Course: [name] is a 89 yo female with PMH afib, PAD, HTN, CHF, AS who presented with worsening redness and pain in her left leg.  She also follows with outpatient wound care center for bilateral wounds on her feet.  On admission, there was concern for left lower extremity cellulitis and she was started on antibiotics and admitted for further monitoring.  She also underwent further workup for other infectious etiologies.  UA was consistent with large LE, greater than 50 WBC.  Of note, she also had recent urinary testing outpatient on 04/21/2023 with urine specimens growing Pseudomonas with some resistance.  Daughter states patient was not treated. She continues to have dysuria.   Interval History:  No events overnight. Left leg looking better this morning compared to pics on daughter's phone from few days ago.  We also discussed prior outpatient Ucx at [facility] (daughter says not treated previously) but patient having urinary symptoms.  Will adjust abx regimen further today.   Assessment and Plan: * Cellulitis of leg, left - consistent with cellulitis; nonpurulent and likely moderate. Today the erythema and calor is present but improved - does have known PAD with recent LLE angiogram and angioplasty on 03/04/23 - s/p vanc/cefepime on admission; initially transitioned to rocephin today but after discussion with daughter about prior urine culture, will change to ceftaz for urine and add doxy for cellulitis coverage (discussed with pharmacy)  UTI (urinary tract infection) - Per daughter, she has been symptomatic.  UA also consistent with probable infection given symptoms - Prior [facility] culture at [facility] noted with Pseudomonas with resistance pattern reviewed -No urine culture sent on admission, will try to see if can get added on -For now,  transition antibiotics to [facility] and follow-up if urine culture can be added -Given length of time from [facility] urine culture, planning on short course  Permanent atrial fibrillation (HCC) - On Coumadin at home.  INR supratherapeutic on admission with some bruising noted on right arm -Pharmacy managing Coumadin, appreciate assistance  Severe aortic stenosis - not a TAVR candidate - followed by cardiology   Chronic kidney disease, stage 3b (HCC) - patient has history of CKD3b. 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   Old records reviewed in assessment of this patient  Antimicrobials: Cefepime 8/27 >> 8/28 Vanc 8/28 x 1 Ceftazidime 8/29 >> current   DVT prophylaxis:   warfarin (COUMADIN) tablet 1 mg   Code Status:   Code Status: DNR  Mobility Assessment (Last 72 Hours)     Mobility Assessment     Row Name 05/07/23 1100 05/06/23 2006 05/06/23 1600 05/06/23 1500     Does patient have an order for bedrest or is patient medically unstable No - Continue assessment No - Continue assessment No - Continue assessment No - Continue assessment    What is the highest level of mobility based on the progressive mobility assessment? Level 4 (Walks with assist in room) - Balance while marching in place and cannot step forward and back - Complete Level 4 (Walks with assist in  room) - Balance while marching in place and cannot step forward and back - Complete Level 4 (Walks with assist in room) - Balance while marching in place and cannot step forward and back - Complete --             Barriers to discharge: none Disposition Plan:  Home Status is: Inpt  Objective: Blood pressure (!) 125/46, pulse 66, temperature 98.6 F (37  C), resp. rate 18, height 5\' 3"  (1.6 m), weight 70.7 kg, SpO2 100%.  Examination:  Physical Exam Constitutional:      Appearance: Normal appearance.  HENT:     Head: Normocephalic and atraumatic.     Mouth/Throat:     Mouth: Mucous membranes are moist.  Eyes:     Extraocular Movements: Extraocular movements intact.  Cardiovascular:     Rate and Rhythm: Normal rate and regular rhythm.  Pulmonary:     Effort: Pulmonary effort is normal. No respiratory distress.     Breath sounds: Normal breath sounds. No wheezing.  Abdominal:     General: Bowel sounds are normal. There is no distension.     Palpations: Abdomen is soft.     Tenderness: There is no abdominal tenderness.  Musculoskeletal:     Cervical back: Normal range of motion and neck supple.     Comments: Large bruise circumferential on right arm above elbow; left leg noted with lower half having erythema and calor (mild dolor). No open wounds or weeping associated with the cellulitis   Skin:    General: Skin is warm and dry.  Neurological:     General: No focal deficit present.     Mental Status: She is alert.  Psychiatric:        Mood and Affect: Mood normal.      Consultants:    Procedures:    Data Reviewed: Results for orders placed or performed during the hospital encounter of 05/05/23 (from the past 24 hour(s))  Protime-INR     Status: Abnormal   Collection Time: 05/07/23  3:25 AM  Result Value Ref Range   Prothrombin Time 32.1 (H) 11.4 - 15.2 seconds   INR 3.1 (H) 0.8 - 1.2  Basic metabolic panel     Status: Abnormal   Collection Time: 05/07/23  3:25 AM  Result Value Ref Range   Sodium 131 (L) 135 - 145 mmol/L   Potassium 3.6 3.5 - 5.1 mmol/L   Chloride 95 (L) 98 - 111 mmol/L   CO2 27 22 - 32 mmol/L   Glucose, Bld 87 70 - 99 mg/dL   BUN 35 (H) 8 - 23 mg/dL   Creatinine, Ser 9.14 (H) 0.44 - 1.00 mg/dL   Calcium 8.6 (L) 8.9 - 10.3 mg/dL   GFR, Estimated 44 (L) >60 mL/min   Anion gap 9 5 - 15  CBC      Status: Abnormal   Collection Time: 05/07/23  3:25 AM  Result Value Ref Range   WBC 6.4 4.0 - 10.5 K/uL   RBC 2.12 (L) 3.87 - 5.11 MIL/uL   Hemoglobin 6.5 (LL) 12.0 - 15.0 g/dL   HCT 78.2 (L) 95.6 - 21.3 %   MCV 95.8 80.0 - 100.0 fL   MCH 30.7 26.0 - 34.0 pg   MCHC 32.0 30.0 - 36.0 g/dL   RDW 08.6 57.8 - 46.9 %   Platelets 170 150 - 400 K/uL   nRBC 0.0 0.0 - 0.2 %  ABO/Rh     Status: None   Collection  Time: 05/07/23  3:25 AM  Result Value Ref Range   ABO/RH(D)      A POS Performed at [facility], [address], [place], [place] [address]   Type and screen [facility]     Status: None (Preliminary result)   Collection Time: 05/07/23  7:23 AM  Result Value Ref Range   ABO/RH(D) A POS    Antibody Screen NEG    Sample Expiration 05/10/2023,2359    Unit Number [id number]    Blood Component Type RED CELLS,LR    Unit division 00    Status of Unit ISSUED    Transfusion Status OK TO TRANSFUSE    Crossmatch Result      Compatible Performed at [facility], [address], [place], [place] [address]   Prepare RBC (crossmatch)     Status: None   Collection Time: 05/07/23  7:23 AM  Result Value Ref Range   Order Confirmation      ORDER PROCESSED BY BLOOD BANK Performed at [facility], [address], [place], [place] [address]     I have reviewed pertinent nursing notes, vitals, labs, and images as necessary. I have ordered labwork to follow up on as indicated.  I have reviewed the last notes from staff over past 24 hours. I have discussed patient's care plan and test results with nursing staff, CM/SW, and other staff as appropriate.  Time spent: Greater than 50% of the 55 minute visit was spent in counseling/coordination of care for the patient as laid out in the A&P.   LOS: 1 day   Lewie Chamber, MD [facility] 05/07/2023, 4:04 PM

## 2023-05-07 NOTE — Hospital Course (Addendum)
Ms. Heinzelman is a 89 yo female with PMH afib, PAD, HTN, CHF, AS who presented with worsening redness and pain in her left leg.  She also follows with outpatient wound care center for bilateral wounds on her feet.  On admission, there was concern for left lower extremity cellulitis and she was started on antibiotics and admitted for further monitoring.  She also underwent further workup for other infectious etiologies.  UA was consistent with large LE, greater than 50 WBC.  Of note, she also had recent urinary testing outpatient on 04/21/2023 with urine specimens growing Pseudomonas with some resistance.  Daughter states patient was not treated and she was endorsing symptoms on admission.

## 2023-05-07 NOTE — Assessment & Plan Note (Addendum)
-   Per daughter, she has been symptomatic.  UA also consistent with probable infection given symptoms - [facility] at [facility] noted with Pseudomonas with resistance pattern reviewed -No urine culture sent on admission; added on; followup - continue fortaz for coverage given prior Pseudo (sens reviewed); goal 5 days of treatment; completed inpatient

## 2023-05-07 NOTE — Progress Notes (Signed)
ANTICOAGULATION CONSULT NOTE - Follow Up Consult  Pharmacy Consult for Warfarin Indication: Hx VTE & Afib  Patient Measurements: Height: 5\' 3"  (160 cm) Weight: 70.7 kg (155 lb 13.8 oz) IBW/kg (Calculated) : 52.4 Heparin Dosing Weight:   Vital Signs: Temp: 98.9 F (37.2 C) (08/29 0327) Temp Source: Axillary (08/28 2304) BP: 143/54 (08/29 0327) Pulse Rate: 69 (08/29 0327)  Labs: Recent Labs    05/05/23 2255 05/06/23 0740 05/07/23 0325  HGB 8.2* 7.7* 6.5*  HCT 25.3* 23.5* 20.3*  PLT 205 183 170  LABPROT 39.2*  --  32.1*  INR 4.0*  --  3.1*  CREATININE 1.29* 1.18* 1.16*    Estimated Creatinine Clearance: 28.6 mL/min (A) (by C-G formula based on SCr of 1.16 mg/dL (H)).   Medications:  Scheduled:   sodium chloride   Intravenous Once   aspirin EC  81 mg Oral Daily   atorvastatin  10 mg Oral Daily   bumetanide  0.5 mg Oral BID   docusate sodium  100 mg Oral BID   estradiol  1 Applicatorful Vaginal QHS   gabapentin  300 mg Oral BID   levothyroxine  100 mcg Oral Q0600   mupirocin cream   Topical Daily   pantoprazole  40 mg Oral Daily   spironolactone  12.5 mg Oral Daily   Warfarin - Pharmacist Dosing Inpatient   Does not apply q1600   Infusions:   ceFEPime (MAXIPIME) IV Stopped (05/06/23 2049)   vancomycin      Assessment: [name] on chronic warfarin for afib + hx VTE. She is admitted with cellulitis and started on broad-spectrum antibiotics which could increase INR.  Pharmacy is consulted for inpatient warfarin dosing.   Home Warfarin 1mg  on MWF and 2mg  on TuThSaSu.  Last dose 8/26 ~10p.  Admit INR elevated at 4  Today, 05/07/2023: INR decreased to 3.1 (after no warfarin 8/27 or 8/28) CBC: Hgb decreased to 6.5, Plt WNL No s/s bleeding or complications reported.  Goal of Therapy:  INR 2-3 Monitor platelets by anticoagulation protocol: Yes   Plan:  Warfarin 1 mg PO x 1 at 16:00 Daily PT/INR, CBC Monitor for signs and symptoms of bleeding.  Lynann Beaver  PharmD, BCPS [facility] [phone] 05/07/2023 10:14 AM

## 2023-05-07 NOTE — Progress Notes (Signed)
    Patient Name: [name]           DOB: 29-Jun-1928  MRN: [id number]      Admission Date: 05/05/2023  Attending Provider: Maryln Gottron, MD  Primary Diagnosis: Cellulitis of leg, left   Level of care: Med-Surg    CROSS COVER NOTE   Date of Service   05/07/2023   [name], 89 y.o. female, was admitted on 05/05/2023 for Cellulitis of leg, left.    HPI/Events of Note   Acute on Chronic Anemia Hemoglobin 7.7 -->  6.5.  No acute changes reported.  Hemodynamically stable. No melena, hematochezia, or other bleeding reported tonight.    Interventions/ Plan   Blood transfusion, 1 unit PRBC        Anthoney Harada, DNP, [employer] [facility] [place]

## 2023-05-07 NOTE — Assessment & Plan Note (Signed)
--   continue protonix  ?

## 2023-05-07 NOTE — Assessment & Plan Note (Signed)
-   on chronic coumadin

## 2023-05-08 ENCOUNTER — Encounter (HOSPITAL_COMMUNITY): Payer: Self-pay | Admitting: Family Medicine

## 2023-05-08 DIAGNOSIS — D62 Acute posthemorrhagic anemia: Secondary | ICD-10-CM | POA: Diagnosis not present

## 2023-05-08 DIAGNOSIS — I4821 Permanent atrial fibrillation: Secondary | ICD-10-CM | POA: Diagnosis not present

## 2023-05-08 DIAGNOSIS — N3000 Acute cystitis without hematuria: Secondary | ICD-10-CM | POA: Diagnosis not present

## 2023-05-08 DIAGNOSIS — R5381 Other malaise: Secondary | ICD-10-CM

## 2023-05-08 DIAGNOSIS — L03116 Cellulitis of left lower limb: Secondary | ICD-10-CM | POA: Diagnosis not present

## 2023-05-08 LAB — CBC WITH DIFFERENTIAL/PLATELET
Abs Immature Granulocytes: 0.22 10*3/uL — ABNORMAL HIGH (ref 0.00–0.07)
Basophils Absolute: 0.1 10*3/uL (ref 0.0–0.1)
Basophils Relative: 1 %
Eosinophils Absolute: 0.4 10*3/uL (ref 0.0–0.5)
Eosinophils Relative: 4 %
HCT: 21.8 % — ABNORMAL LOW (ref 36.0–46.0)
Hemoglobin: 7.2 g/dL — ABNORMAL LOW (ref 12.0–15.0)
Immature Granulocytes: 2 %
Lymphocytes Relative: 9 %
Lymphs Abs: 1 10*3/uL (ref 0.7–4.0)
MCH: 31.3 pg (ref 26.0–34.0)
MCHC: 33 g/dL (ref 30.0–36.0)
MCV: 94.8 fL (ref 80.0–100.0)
Monocytes Absolute: 0.7 10*3/uL (ref 0.1–1.0)
Monocytes Relative: 7 %
Neutro Abs: 8.1 10*3/uL — ABNORMAL HIGH (ref 1.7–7.7)
Neutrophils Relative %: 77 %
Platelets: 184 10*3/uL (ref 150–400)
RBC: 2.3 MIL/uL — ABNORMAL LOW (ref 3.87–5.11)
RDW: 15.8 % — ABNORMAL HIGH (ref 11.5–15.5)
WBC: 10.5 10*3/uL (ref 4.0–10.5)
nRBC: 0 % (ref 0.0–0.2)

## 2023-05-08 LAB — URINE CULTURE: Culture: NO GROWTH

## 2023-05-08 LAB — BASIC METABOLIC PANEL
Anion gap: 9 (ref 5–15)
BUN: 29 mg/dL — ABNORMAL HIGH (ref 8–23)
CO2: 26 mmol/L (ref 22–32)
Calcium: 8.2 mg/dL — ABNORMAL LOW (ref 8.9–10.3)
Chloride: 89 mmol/L — ABNORMAL LOW (ref 98–111)
Creatinine, Ser: 0.97 mg/dL (ref 0.44–1.00)
GFR, Estimated: 54 mL/min — ABNORMAL LOW (ref >60)
Glucose, Bld: 102 mg/dL — ABNORMAL HIGH (ref 70–99)
Potassium: 3.6 mmol/L (ref 3.5–5.1)
Sodium: 124 mmol/L — ABNORMAL LOW (ref 135–145)

## 2023-05-08 LAB — PROTIME-INR
INR: 2.7 — ABNORMAL HIGH (ref 0.8–1.2)
Prothrombin Time: 29 s — ABNORMAL HIGH (ref 11.4–15.2)

## 2023-05-08 LAB — MAGNESIUM: Magnesium: 1.8 mg/dL (ref 1.7–2.4)

## 2023-05-08 LAB — OCCULT BLOOD X 1 CARD TO LAB, STOOL: Fecal Occult Bld: POSITIVE — AB

## 2023-05-08 MED ORDER — FLEET ENEMA RE ENEM
1.0000 | ENEMA | Freq: Once | RECTAL | Status: AC
  Administered 2023-05-08: 1 via RECTAL
  Filled 2023-05-08: qty 1

## 2023-05-08 MED ORDER — HALOPERIDOL LACTATE 5 MG/ML IJ SOLN
1.0000 mg | Freq: Once | INTRAMUSCULAR | Status: AC
  Administered 2023-05-08: 1 mg via INTRAVENOUS
  Filled 2023-05-08: qty 1

## 2023-05-08 MED ORDER — WARFARIN SODIUM 1 MG PO TABS
1.0000 mg | ORAL_TABLET | Freq: Once | ORAL | Status: AC
  Administered 2023-05-08: 1 mg via ORAL
  Filled 2023-05-08: qty 1

## 2023-05-08 MED ORDER — MELATONIN 5 MG PO TABS
5.0000 mg | ORAL_TABLET | Freq: Every evening | ORAL | Status: DC | PRN
  Administered 2023-05-09 – 2023-05-10 (×2): 5 mg via ORAL
  Filled 2023-05-08 (×2): qty 1

## 2023-05-08 MED ORDER — SODIUM CHLORIDE 0.9 % IV SOLN: INTRAVENOUS | Status: AC

## 2023-05-08 NOTE — Progress Notes (Signed)
ANTICOAGULATION CONSULT NOTE - Follow Up Consult  Pharmacy Consult for Warfarin Indication: Hx VTE & Afib  Patient Measurements: Height: 5\' 3"  (160 cm) Weight: 70.7 kg (155 lb 13.8 oz) IBW/kg (Calculated) : 52.4 Heparin Dosing Weight:   Vital Signs: Temp: 98.2 F (36.8 C) (08/30 0408) BP: 129/55 (08/30 0530) Pulse Rate: 71 (08/30 0530)  Labs: Recent Labs    05/05/23 2255 05/06/23 0740 05/07/23 0325 05/07/23 1738 05/08/23 0323  HGB 8.2* 7.7* 6.5* 7.7* 7.2*  HCT 25.3* 23.5* 20.3* 23.6* 21.8*  PLT 205 183 170  --  184  LABPROT 39.2*  --  32.1*  --  29.0*  INR 4.0*  --  3.1*  --  2.7*  CREATININE 1.29* 1.18* 1.16*  --  0.97    Estimated Creatinine Clearance: 34.1 mL/min (by C-G formula based on SCr of 0.97 mg/dL).   Medications:  Scheduled:   aspirin EC  81 mg Oral Daily   atorvastatin  10 mg Oral Daily   docusate sodium  100 mg Oral BID   doxycycline  100 mg Oral Q12H   estradiol  1 Applicatorful Vaginal QHS   gabapentin  300 mg Oral BID   levothyroxine  100 mcg Oral Q0600   mupirocin cream   Topical Daily   pantoprazole  40 mg Oral Daily   Warfarin - Pharmacist Dosing Inpatient   Does not apply q1600   Infusions:   sodium chloride     cefTAZidime (FORTAZ)  IV 2 g (05/07/23 1637)    Assessment: [name] on chronic warfarin for afib + hx VTE. She is admitted with cellulitis and started on broad-spectrum antibiotics which could increase INR.  Pharmacy is consulted for inpatient warfarin dosing.   Home Warfarin 1mg  on MWF and 2mg  on TuThSaSu.  Last dose 8/26 ~10p.  Admit INR elevated at 4  Today, 05/08/2023: INR decreased to 2.7 (after no warfarin 8/27 or 8/28) CBC: Hgb increased to 7.2 (s/p PRBC x1 on 8/29), Plt WNL No s/s bleeding or complications reported by RN but noted FOB positive Antibiotics transitioned to Ceftazidime and doxycycline.  Goal of Therapy:  INR 2-3 Monitor platelets by anticoagulation protocol: Yes   Plan:  Warfarin 1 mg PO x 1 at  16:00 Daily PT/INR, CBC Monitor for signs and symptoms of bleeding.  Lynann Beaver PharmD, BC[facility] [phone] 05/08/2023 8:54 AM

## 2023-05-08 NOTE — Plan of Care (Signed)

## 2023-05-08 NOTE — Assessment & Plan Note (Signed)
-   now becoming more hyponatremic and more confused overnight, raising concern for symptomatic hyponatremia - was on bumex and spirono from 8/28-8/29 but stopped this once Na dropped to 124 on 8/30 and placed her on NS @ 50 - this morning Na now down to 119 after starting IVF yesterday; will d/c IVF and check urine studies and serum osmo. Suspicion and concern is for SIADH - nephrology also consulted for assistance

## 2023-05-08 NOTE — Assessment & Plan Note (Signed)
-   Hgb drop, was down to 6.5 g/dL after admission - differential includes spontaneous bleeding 2/2 supratherapeutic INR (now improved without reversal) vs occult GIB - FOBT positive; discussed with GI. Given high risk for anesthesia, will proceed with conservative measures for now as risk outweighs benefit currently - INR therapeutic; Hgb drop noted again today, 6.7 g/dL. Received 1 unit PRBC and Hgb improved to 8.4 g/dL. Discussed with daughter.  - next plan is to obtain CTA A/P if Hgb does drop again in efforts to locate if possible; options would be ongoing observation vs consideration of discontinuing Coumadin but her stroke risk remains high off AC; (CHA2DS2-VASc = 6 pts) - for reference, also has high ATRIA bleed score, 7 points (5.8% annual risk hemorrhage) and high HAS-BLED score.   - This will have to be a risk/benefit discussion with patient/family as we continue. She has a fairly decent QOL and her mortality from stroke is almost as comparable to her mortality from bleeding risk. After my discussion with daughter (daily) and again today, 9/1, consensus is to continue Coumadin at this time especially since INR well controlled and therapeutic for now - currently Hgb stable 8.4 g/dL. Will plan on outpatient repeat labwork at discharge

## 2023-05-08 NOTE — Progress Notes (Signed)
Progress Note    [name]   ZOX:[id number]  DOB: November 26, 1927  DOA: 05/05/2023     2 PCP: Camie Patience, FNP  Initial CC: left leg redness  Hospital Course: [name] is a 89 yo female with PMH afib, PAD, HTN, CHF, AS who presented with worsening redness and pain in her left leg.  She also follows with outpatient wound care center for bilateral wounds on her feet.  On admission, there was concern for left lower extremity cellulitis and she was started on antibiotics and admitted for further monitoring.  She also underwent further workup for other infectious etiologies.  UA was consistent with large LE, greater than 50 WBC.  Of note, she also had recent urinary testing outpatient on 04/21/2023 with urine specimens growing Pseudomonas with some resistance.  Daughter states patient was not treated. She continues to have dysuria.   Interval History:  No events overnight. Left leg still improving dramatically. Bruise on R arm continues. No obvious bleeding otherwise.  Updated daughter in the hall today.  Tentative d/c tomorrow if Hgb stable/improved.   Assessment and Plan: * Cellulitis of leg, left - consistent with cellulitis; nonpurulent and likely moderate. Today the erythema and calor is present but improved - does have known PAD with recent LLE angiogram and angioplasty on 03/04/23 - s/p vanc/cefepime on admission - continue doxy mono for coverage  ABLA (acute blood loss anemia) - Hgb drop, was down to 6.5 g/dL after admission - differential includes spontaneous bleeding 2/2 supratherapeutic INR (now improved naturally) vs occult GIB - FOBT positive; discussed with GI. Given high risk for anesthesia, will proceed with conservative measures for now as risk outweighs benefit currently - Hgb 7.2 g/dL this am - repeat Hgb again in am  UTI (urinary tract infection) - Per daughter, she has been symptomatic.  UA also consistent with probable infection given symptoms - [facility] Pseud[facility] with resistance pattern reviewed -No urine culture sent on admission; added on; followup - continue fortaz for coverage given prior Pseudo (sens reviewed)  Permanent atrial fibrillation (HCC) - On Coumadin at home.  INR supratherapeutic on admission with some bruising noted on right arm -Pharmacy managing Coumadin, appreciate assistance  Severe aortic stenosis - not a TAVR candidate - followed by cardiology   Chronic kidney disease, stage 3b (HCC) - patient has history of CKD3b. 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   Old records reviewed in assessment of this patient  Antimicrobials: Cefepime 8/27 >> 8/28 Vanc 8/28 x 1 Ceftazidime 8/29 >> current   DVT prophylaxis:   warfarin (COUMADIN) tablet 1 mg   Code Status:   Code Status: DNR  Mobility Assessment (Last 72 Hours)     Mobility Assessment     Row Name 05/07/23 2255 05/07/23 1100 05/06/23 2006 05/06/23 1600 05/06/23 1500   Does patient have an order for bedrest or is patient medically unstable No - Continue assessment No - Continue assessment No -  Continue assessment No - Continue assessment No - Continue assessment   What is the highest level of mobility based on the progressive mobility assessment? Level 4 (Walks with assist in room) - Balance while marching in place and cannot step forward and back - Complete Level 4 (Walks with assist in room) - Balance while marching in place and cannot  step forward and back - Complete Level 4 (Walks with assist in room) - Balance while marching in place and cannot step forward and back - Complete Level 4 (Walks with assist in room) - Balance while marching in place and cannot step forward and back - Complete --            Barriers to discharge: none Disposition Plan:  Home Status is: Inpt  Objective: Blood pressure (!) 129/55, pulse 71, temperature 98.2 F (36.8 C), resp. rate 17, height 5\' 3"  (1.6 m), weight 70.7 kg, SpO2 98%.  Examination:  Physical Exam Constitutional:      Appearance: Normal appearance.  HENT:     Head: Normocephalic and atraumatic.     Mouth/Throat:     Mouth: Mucous membranes are moist.  Eyes:     Extraocular Movements: Extraocular movements intact.  Cardiovascular:     Rate and Rhythm: Normal rate and regular rhythm.  Pulmonary:     Effort: Pulmonary effort is normal. No respiratory distress.     Breath sounds: Normal breath sounds. No wheezing.  Abdominal:     General: Bowel sounds are normal. There is no distension.     Palpations: Abdomen is soft.     Tenderness: There is no abdominal tenderness.  Musculoskeletal:     Cervical back: Normal range of motion and neck supple.     Comments: Large bruise circumferential on right arm above elbow; left leg noted with lower half having erythema and calor (mild dolor). No open wounds or weeping associated with the cellulitis   Skin:    General: Skin is warm and dry.  Neurological:     General: No focal deficit present.     Mental Status: She is alert.  Psychiatric:        Mood and Affect: Mood normal.      Consultants:    Procedures:    Data Reviewed: Results for orders placed or performed during the hospital encounter of 05/05/23 (from the past 24 hour(s))  Hemoglobin and hematocrit, blood     Status: Abnormal   Collection Time: 05/07/23  5:38 PM  Result Value Ref Range   Hemoglobin 7.7 (L) 12.0 - 15.0 g/dL   HCT 19.1 (L) 47.8 - 29.5 %   Occult blood card to lab, stool     Status: Abnormal   Collection Time: 05/08/23  2:26 AM  Result Value Ref Range   Fecal Occult Bld POSITIVE (A) NEGATIVE  Protime-INR     Status: Abnormal   Collection Time: 05/08/23  3:23 AM  Result Value Ref Range   Prothrombin Time 29.0 (H) 11.4 - 15.2 seconds   INR 2.7 (H) 0.8 - 1.2  Basic metabolic panel     Status: Abnormal   Collection Time: 05/08/23  3:23 AM  Result Value Ref Range   Sodium 124 (L) 135 - 145 mmol/L   Potassium 3.6 3.5 - 5.1 mmol/L   Chloride 89 (L) 98 - 111 mmol/L   CO2 26 22 - 32 mmol/L   Glucose, Bld 102 (H) 70 - 99 mg/dL   BUN 29 (H) 8 - 23 mg/dL  Creatinine, Ser 0.97 0.44 - 1.00 mg/dL   Calcium 8.2 (L) 8.9 - 10.3 mg/dL   GFR, Estimated 54 (L) >60 mL/min   Anion gap 9 5 - 15  CBC with Differential/Platelet     Status: Abnormal   Collection Time: 05/08/23  3:23 AM  Result Value Ref Range   WBC 10.5 4.0 - 10.5 K/uL   RBC 2.30 (L) 3.87 - 5.11 MIL/uL   Hemoglobin 7.2 (L) 12.0 - 15.0 g/dL   HCT 40.9 (L) 81.1 - 91.4 %   MCV 94.8 80.0 - 100.0 fL   MCH 31.3 26.0 - 34.0 pg   MCHC 33.0 30.0 - 36.0 g/dL   RDW 78.2 (H) 95.6 - 21.3 %   Platelets 184 150 - 400 K/uL   nRBC 0.0 0.0 - 0.2 %   Neutrophils Relative % 77 %   Neutro Abs 8.1 (H) 1.7 - 7.7 K/uL   Lymphocytes Relative 9 %   Lymphs Abs 1.0 0.7 - 4.0 K/uL   Monocytes Relative 7 %   Monocytes Absolute 0.7 0.1 - 1.0 K/uL   Eosinophils Relative 4 %   Eosinophils Absolute 0.4 0.0 - 0.5 K/uL   Basophils Relative 1 %   Basophils Absolute 0.1 0.0 - 0.1 K/uL   Immature Granulocytes 2 %   Abs Immature Granulocytes 0.22 (H) 0.00 - 0.07 K/uL  Magnesium     Status: None   Collection Time: 05/08/23  3:23 AM  Result Value Ref Range   Magnesium 1.8 1.7 - 2.4 mg/dL    I have reviewed pertinent nursing notes, vitals, labs, and images as necessary. I have ordered labwork to follow up on as indicated.  I have reviewed the last notes from staff over past 24 hours. I have  discussed patient's care plan and test results with nursing staff, CM/SW, and other staff as appropriate.  Time spent: Greater than 50% of the 55 minute visit was spent in counseling/coordination of care for the patient as laid out in the A&P.   LOS: 2 days   Lewie Chamber, MD [facility] 05/08/2023, 4:37 PM

## 2023-05-08 NOTE — Progress Notes (Signed)
    Patient Name: [name]           DOB: 1928-07-22  MRN: [id number]      Admission Date: 05/05/2023  Attending Provider: Lewie Chamber, MD  Primary Diagnosis: Cellulitis of leg, left   Level of care: Med-Surg    CROSS COVER NOTE   Date of Service   05/08/2023   [name], 89 y.o. female, was admitted on 05/05/2023 for Cellulitis of leg, left.    HPI/Events of Note   Agitated, restless, and confused -  Confusion noted on admission.  Likely related to infection, cellulitis and UTI. Patient has been calling 911 multiple times.  Phone has been removed from room. Family was made aware and is unable to spend the night.  She is also attempting to get out of bed multiple times. High fall risk.   Unfortunately, patient is not following commands despite multiple attempts at redirection by staff. Safety sitter has been recommended.    Interventions/ Plan   Will trial 1 mg IV for agitation.  Melatonin has also been ordered to assist with sleep cycle. Safety sitter       Anthoney Harada, DNP, [facility]- [facility]

## 2023-05-08 NOTE — Assessment & Plan Note (Signed)
-   not unsurprising but patient hoping to return to prior level of living - will ask for PT eval; daughter still preferring for taking patient home at discharge.  Declined rehab

## 2023-05-09 DIAGNOSIS — D62 Acute posthemorrhagic anemia: Secondary | ICD-10-CM | POA: Diagnosis not present

## 2023-05-09 DIAGNOSIS — L03116 Cellulitis of left lower limb: Secondary | ICD-10-CM | POA: Diagnosis not present

## 2023-05-09 DIAGNOSIS — E871 Hypo-osmolality and hyponatremia: Secondary | ICD-10-CM

## 2023-05-09 DIAGNOSIS — N3000 Acute cystitis without hematuria: Secondary | ICD-10-CM | POA: Diagnosis not present

## 2023-05-09 LAB — BASIC METABOLIC PANEL
Anion gap: 7 (ref 5–15)
Anion gap: 9 (ref 5–15)
Anion gap: 7 (ref 5–15)
Anion gap: 6 (ref 5–15)
BUN: 20 mg/dL (ref 8–23)
BUN: 20 mg/dL (ref 8–23)
BUN: 18 mg/dL (ref 8–23)
BUN: 18 mg/dL (ref 8–23)
CO2: 26 mmol/L (ref 22–32)
CO2: 26 mmol/L (ref 22–32)
CO2: 25 mmol/L (ref 22–32)
CO2: 26 mmol/L (ref 22–32)
Calcium: 8.6 mg/dL — ABNORMAL LOW (ref 8.9–10.3)
Calcium: 8.9 mg/dL (ref 8.9–10.3)
Calcium: 8.6 mg/dL — ABNORMAL LOW (ref 8.9–10.3)
Calcium: 8.5 mg/dL — ABNORMAL LOW (ref 8.9–10.3)
Chloride: 86 mmol/L — ABNORMAL LOW (ref 98–111)
Chloride: 88 mmol/L — ABNORMAL LOW (ref 98–111)
Chloride: 90 mmol/L — ABNORMAL LOW (ref 98–111)
Chloride: 90 mmol/L — ABNORMAL LOW (ref 98–111)
Creatinine, Ser: 1.01 mg/dL — ABNORMAL HIGH (ref 0.44–1.00)
Creatinine, Ser: 0.91 mg/dL (ref 0.44–1.00)
Creatinine, Ser: 1.02 mg/dL — ABNORMAL HIGH (ref 0.44–1.00)
Creatinine, Ser: 1.13 mg/dL — ABNORMAL HIGH (ref 0.44–1.00)
GFR, Estimated: 52 mL/min — ABNORMAL LOW (ref >60)
GFR, Estimated: 59 mL/min — ABNORMAL LOW (ref >60)
GFR, Estimated: 51 mL/min — ABNORMAL LOW (ref >60)
GFR, Estimated: 45 mL/min — ABNORMAL LOW (ref >60)
Glucose, Bld: 94 mg/dL (ref 70–99)
Glucose, Bld: 101 mg/dL — ABNORMAL HIGH (ref 70–99)
Glucose, Bld: 110 mg/dL — ABNORMAL HIGH (ref 70–99)
Glucose, Bld: 111 mg/dL — ABNORMAL HIGH (ref 70–99)
Potassium: 3.4 mmol/L — ABNORMAL LOW (ref 3.5–5.1)
Potassium: 3.5 mmol/L (ref 3.5–5.1)
Potassium: 4 mmol/L (ref 3.5–5.1)
Potassium: 3.6 mmol/L (ref 3.5–5.1)
Sodium: 119 mmol/L — CL (ref 135–145)
Sodium: 123 mmol/L — ABNORMAL LOW (ref 135–145)
Sodium: 122 mmol/L — ABNORMAL LOW (ref 135–145)
Sodium: 122 mmol/L — ABNORMAL LOW (ref 135–145)

## 2023-05-09 LAB — CBC WITH DIFFERENTIAL/PLATELET
Abs Immature Granulocytes: 0.05 10*3/uL (ref 0.00–0.07)
Basophils Absolute: 0.1 10*3/uL (ref 0.0–0.1)
Basophils Relative: 1 %
Eosinophils Absolute: 0.5 10*3/uL (ref 0.0–0.5)
Eosinophils Relative: 8 %
HCT: 21.9 % — ABNORMAL LOW (ref 36.0–46.0)
Hemoglobin: 7.2 g/dL — ABNORMAL LOW (ref 12.0–15.0)
Immature Granulocytes: 1 %
Lymphocytes Relative: 16 %
Lymphs Abs: 1.1 10*3/uL (ref 0.7–4.0)
MCH: 31 pg (ref 26.0–34.0)
MCHC: 32.9 g/dL (ref 30.0–36.0)
MCV: 94.4 fL (ref 80.0–100.0)
Monocytes Absolute: 0.7 10*3/uL (ref 0.1–1.0)
Monocytes Relative: 10 %
Neutro Abs: 4.5 10*3/uL (ref 1.7–7.7)
Neutrophils Relative %: 64 %
Platelets: 161 10*3/uL (ref 150–400)
RBC: 2.32 MIL/uL — ABNORMAL LOW (ref 3.87–5.11)
RDW: 15 % (ref 11.5–15.5)
WBC: 6.9 10*3/uL (ref 4.0–10.5)
nRBC: 0 % (ref 0.0–0.2)

## 2023-05-09 LAB — HEPATIC FUNCTION PANEL
ALT: 17 U/L (ref 0–44)
AST: 28 U/L (ref 15–41)
Albumin: 2.3 g/dL — ABNORMAL LOW (ref 3.5–5.0)
Alkaline Phosphatase: 104 U/L (ref 38–126)
Bilirubin, Direct: 0.1 mg/dL (ref 0.0–0.2)
Indirect Bilirubin: 0.3 mg/dL (ref 0.3–0.9)
Total Bilirubin: 0.4 mg/dL (ref 0.3–1.2)
Total Protein: 5.2 g/dL — ABNORMAL LOW (ref 6.5–8.1)

## 2023-05-09 LAB — TSH: TSH: 3.892 u[IU]/mL (ref 0.350–4.500)

## 2023-05-09 LAB — SODIUM, URINE, RANDOM: Sodium, Ur: 16 mmol/L

## 2023-05-09 LAB — MAGNESIUM: Magnesium: 1.9 mg/dL (ref 1.7–2.4)

## 2023-05-09 LAB — OSMOLALITY, URINE: Osmolality, Ur: 366 mOsm/kg (ref 300–900)

## 2023-05-09 LAB — PROTIME-INR
INR: 2.7 — ABNORMAL HIGH (ref 0.8–1.2)
Prothrombin Time: 29.2 s — ABNORMAL HIGH (ref 11.4–15.2)

## 2023-05-09 LAB — OSMOLALITY: Osmolality: 262 mOsm/kg — ABNORMAL LOW (ref 275–295)

## 2023-05-09 LAB — CREATININE, URINE, RANDOM: Creatinine, Urine: 60 mg/dL

## 2023-05-09 MED ORDER — WARFARIN SODIUM 1 MG PO TABS
1.0000 mg | ORAL_TABLET | Freq: Once | ORAL | Status: AC
  Administered 2023-05-09: 1 mg via ORAL
  Filled 2023-05-09: qty 1

## 2023-05-09 MED ORDER — TRAZODONE HCL 50 MG PO TABS
50.0000 mg | ORAL_TABLET | Freq: Every evening | ORAL | Status: DC | PRN
  Administered 2023-05-09: 50 mg via ORAL
  Filled 2023-05-09: qty 1

## 2023-05-09 MED ORDER — TOLVAPTAN 15 MG PO TABS
15.0000 mg | ORAL_TABLET | Freq: Once | ORAL | Status: AC
  Administered 2023-05-09: 15 mg via ORAL
  Filled 2023-05-09: qty 1

## 2023-05-09 MED ORDER — SODIUM CHLORIDE 0.9 % IV SOLN
2.0000 g | Freq: Two times a day (BID) | INTRAVENOUS | Status: DC
  Administered 2023-05-09: 2 g via INTRAVENOUS
  Filled 2023-05-09 (×2): qty 2

## 2023-05-09 MED ORDER — SODIUM CHLORIDE 0.9 % IV SOLN: INTRAVENOUS | Status: DC

## 2023-05-09 NOTE — Consult Note (Signed)
Reason for Consult: Hyponatremia Referring Physician: Jimmie Rozsa is an 89 y.o. female with HTN, Afib, hx PE on coumadin, AS, HFpEF, bilat LE wounds, possibly some dementia, lives with daughter.  Brought into he ER on 8/27 with complaints of cellulitis around one of her LE wounds-  admitted and started on abx.  Regarding sodium , it appears that she has some baseline hyponatremia- in the high 120's to low 130's.  On 04/02/23 was 126.  Upon admission on 8/27 was 125 but then improved to 131 by 8/29-  had been given an LR bolus on admit  but also  continued on her bumex and aldactone from 8/27 to 8/29- then on 8/30 for unclear reasons went down to 124-  so bumex and aldactone were stopped and NS was started -  na worsened to 119 this AM-  ns stopped-  no other action then later sodium check was 123.  Historically there was a urine osm that was 335.  When urine osm was checked on 8/28 ( on bumex)  it was 131-  one from today is pending.  TSH is WNL.  UOP has not been well recorded-  urine sodium and osm from today are pending.  Pt is very sleepy on my interview-  says she has been eating normally    Trend in Creatinine: Creatinine, Ser  Date/Time Value Ref Range Status  05/09/2023 11:04 AM 0.91 0.44 - 1.00 mg/dL Final  54/05/8118 14:78 AM 1.01 (H) 0.44 - 1.00 mg/dL Final  29/56/2130 86:57 AM 0.97 0.44 - 1.00 mg/dL Final  84/69/6295 28:41 AM 1.16 (H) 0.44 - 1.00 mg/dL Final  32/44/0102 72:53 AM 1.18 (H) 0.44 - 1.00 mg/dL Final  66/44/0347 42:59 PM 1.29 (H) 0.44 - 1.00 mg/dL Final  56/38/7564 33:29 PM 1.15 (H) 0.44 - 1.00 mg/dL Final  51/88/4166 06:30 AM 1.10 (H) 0.44 - 1.00 mg/dL Final  16/09/930 35:57 AM 1.10 (H) 0.44 - 1.00 mg/dL Final  32/20/2542 70:62 AM 0.94 0.44 - 1.00 mg/dL Final  37/62/8315 17:61 AM 1.09 (H) 0.44 - 1.00 mg/dL Final  60/73/7106 26:94 AM 1.25 (H) 0.44 - 1.00 mg/dL Final  85/46/2703 50:09 PM 1.16 (H) 0.44 - 1.00 mg/dL Final  38/18/2993 71:69 AM 1.28 (H) 0.44 - 1.00  mg/dL Final  67/89/3810 17:51 AM 1.34 (H) 0.44 - 1.00 mg/dL Final  02/58/5277 82:42 AM 1.26 (H) 0.44 - 1.00 mg/dL Final  35/36/1443 15:40 AM 1.33 (H) 0.44 - 1.00 mg/dL Final  08/67/6195 09:32 AM 1.26 (H) 0.44 - 1.00 mg/dL Final  67/08/4579 99:83 PM 1.23 (H) 0.44 - 1.00 mg/dL Final  38/25/0539 76:73 AM 1.23 (H) 0.44 - 1.00 mg/dL Final  41/93/7902 40:97 PM 1.20 (H) 0.44 - 1.00 mg/dL Final  35/32/9924 26:83 AM 1.25 (H) 0.44 - 1.00 mg/dL Final  41/96/2229 79:89 AM 1.40 (H) 0.44 - 1.00 mg/dL Final  21/19/4174 08:14 AM 1.49 (H) 0.44 - 1.00 mg/dL Final  48/18/5631 49:70 AM 1.36 (H) 0.44 - 1.00 mg/dL Final  26/37/8588 50:27 AM 1.39 (H) 0.44 - 1.00 mg/dL Final  74/08/8785 76:72 AM 1.38 (H) 0.44 - 1.00 mg/dL Final  09/47/0962 83:66 AM 1.38 (H) 0.44 - 1.00 mg/dL Final  29/47/6546 50:35 AM 1.47 (H) 0.44 - 1.00 mg/dL Final  46/56/8127 51:70 PM 1.47 (H) 0.44 - 1.00 mg/dL Final  01/74/9449 67:59 PM 1.57 (H) 0.44 - 1.00 mg/dL Final  16/38/4665 99:35 AM 2.08 (H) 0.44 - 1.00 mg/dL Final  70/17/7939 03:00 AM 2.10 (H) 0.44 - 1.00 mg/dL  Final  11/21/2021 05:32 AM 1.29 (H) 0.44 - 1.00 mg/dL Final  16/06/9603 54:09 AM 1.24 (H) 0.44 - 1.00 mg/dL Final  81/19/1478 29:56 AM 1.16 (H) 0.44 - 1.00 mg/dL Final  21/30/8657 84:69 AM 1.31 (H) 0.44 - 1.00 mg/dL Final  62/95/2841 32:44 AM 1.52 (H) 0.44 - 1.00 mg/dL Final  09/10/7251 66:44 AM 1.94 (H) 0.44 - 1.00 mg/dL Final  03/47/4259 56:38 AM 1.30 (H) 0.44 - 1.00 mg/dL Final  75/64/3329 51:88 PM 1.29 (H) 0.44 - 1.00 mg/dL Final  41/66/0630 16:01 AM 1.33 (H) 0.44 - 1.00 mg/dL Final  09/32/3557 32:20 PM 1.46 (H) 0.57 - 1.00 mg/dL Final  25/42/7062 37:62 PM 1.46 (H) 0.57 - 1.00 mg/dL Final   Sodium  Date/Time Value Ref Range Status  05/09/2023 11:04 AM 123 (L) 135 - 145 mmol/L Final  05/09/2023 04:09 AM 119 (LL) 135 - 145 mmol/L Final    Comment:    CRITICAL RESULT CALLED TO, READ BACK BY AND VERIFIED WITH RICKARDS, L RN @ 05/09/23. GILBERTL   05/08/2023 03:23 AM  124 (L) 135 - 145 mmol/L Final    Comment:    DELTA CHECK NOTED  05/07/2023 03:25 AM 131 (L) 135 - 145 mmol/L Final  05/06/2023 07:40 AM 127 (L) 135 - 145 mmol/L Final  05/05/2023 10:55 PM 125 (L) 135 - 145 mmol/L Final  04/02/2023 04:44 PM 126 (L) 135 - 145 mmol/L Final  03/04/2023 10:07 AM 133 (L) 135 - 145 mmol/L Final  03/02/2023 06:29 AM 127 (L) 135 - 145 mmol/L Final  01/17/2023 05:05 AM 130 (L) 135 - 145 mmol/L Final  01/16/2023 05:26 AM 131 (L) 135 - 145 mmol/L Final  01/15/2023 06:42 PM 133 (L) 135 - 145 mmol/L Final  11/07/2022 08:20 AM 127 (L) 135 - 145 mmol/L Final  11/06/2022 08:43 AM 126 (L) 135 - 145 mmol/L Final  11/05/2022 07:57 AM 129 (L) 135 - 145 mmol/L Final  11/04/2022 08:26 AM 129 (L) 135 - 145 mmol/L Final  11/03/2022 07:13 AM 127 (L) 135 - 145 mmol/L Final  11/02/2022 06:12 PM 128 (L) 135 - 145 mmol/L Final  11/02/2022 03:40 AM 127 (L) 135 - 145 mmol/L Final  11/01/2022 06:27 PM 127 (L) 135 - 145 mmol/L Final  11/01/2022 07:20 AM 126 (L) 135 - 145 mmol/L Final  10/31/2022 04:10 AM 129 (L) 135 - 145 mmol/L Final  10/30/2022 03:11 AM 129 (L) 135 - 145 mmol/L Final  10/29/2022 03:01 AM 127 (L) 135 - 145 mmol/L Final  10/28/2022 02:44 AM 131 (L) 135 - 145 mmol/L Final  10/27/2022 02:09 AM 130 (L) 135 - 145 mmol/L Final  10/26/2022 08:03 AM 130 (L) 135 - 145 mmol/L Final  10/25/2022 02:47 AM 131 (L) 135 - 145 mmol/L Final  10/23/2022 11:19 PM 135 135 - 145 mmol/L Final  10/23/2022 09:52 PM 132 (L) 135 - 145 mmol/L Final  02/21/2022 08:35 AM 136 135 - 145 mmol/L Final  02/21/2022 07:12 AM 136 135 - 145 mmol/L Final  11/21/2021 05:32 AM 127 (L) 135 - 145 mmol/L Final  11/20/2021 05:14 AM 128 (L) 135 - 145 mmol/L Final  11/19/2021 05:06 AM 124 (L) 135 - 145 mmol/L Final  11/18/2021 04:27 AM 125 (L) 135 - 145 mmol/L Final  11/17/2021 06:02 AM 126 (L) 135 - 145 mmol/L Final  11/16/2021 07:09 AM 130 (L) 135 - 145 mmol/L Final  11/15/2021 05:43 PM 130 (L) 135 - 145  mmol/L Final  08/02/2020 08:40 AM 134 (L) 135 -  145 mmol/L Final  08/30/2019 03:38 PM 135 134 - 144 mmol/L Final  03/22/2019 04:01 PM 136 134 - 144 mmol/L Final   PMH:   Past Medical History:  Diagnosis Date   Anticoagulant long-term use    Arrhythmia    Arthritis    Atrial fibrillation (HCC)    Congestive heart failure (CHF) (HCC)    Dysphagia    Esophageal reflux    Essential hypertension    Hiatal hernia    Pulmonary embolus (HCC)    Thyroid disease     PSH:   Past Surgical History:  Procedure Laterality Date   ABDOMINAL AORTOGRAM W/LOWER EXTREMITY N/A 03/04/2023   Procedure: ABDOMINAL AORTOGRAM W/LOWER EXTREMITY;  Surgeon: [name], MD;  Location: [name] INVASIVE CV LAB;  Service: Cardiovascular;  Laterality: N/A;   AMPUTATION TOE Right 11/17/2021   Procedure: RIGHT SECOND TOE AMPUTATION;  Surgeon: [name], MD;  Location: [facility] ORS;  Service: Orthopedics;  Laterality: Right;   COLONOSCOPY     ELBOW SURGERY     LAPAROSCOPIC HYSTERECTOMY      Allergies:  Allergies  Allergen Reactions   Amoxicillin Other (See Comments)    Unknown reaction  Tolerates Keflex, Cefepime   Azithromycin Other (See Comments)    Unknown reaction    Codeine Other (See Comments)    Unknown reaction    Erythromycin Other (See Comments)    Unknown reaction    Green Dyes Other (See Comments)    Allergic to ALL dyes   Iodine Other (See Comments)    Unknown reaction    Misc. Sulfonamide Containing Compounds    Oxycodone Other (See Comments)    Unknown reaction      Oxycodone-Acetaminophen Other (See Comments)    Unknown reaction    Penicillins Other (See Comments)    Unknown reaction  Tolerates Keflex, Cefepime   Sulfa Antibiotics     Per Pt's daughter - unknown reaction    Sulfasalazine     Unsure of allergy    Tramadol Hives    Medications:   Prior to Admission medications   Medication Sig Start Date End Date Taking? Authorizing Provider  acetaminophen (TYLENOL) 325 MG  tablet Take 2 tablets (650 mg total) by mouth every 6 (six) hours as needed for mild pain or headache. Patient taking differently: Take 650 mg by mouth every 6 (six) hours as needed for mild pain, headache or fever. 11/06/22  Yes Almon Hercules, MD  ascorbic acid (VITAMIN C) 500 MG tablet Take 500 mg by mouth daily.   Yes [provider]  aspirin EC 81 MG tablet Take 1 tablet (81 mg total) by mouth daily. Swallow whole. 03/04/23 03/03/24 Yes [name], MD  atorvastatin (LIPITOR) 10 MG tablet Take 1 tablet (10 mg total) by mouth daily. 03/04/23 03/03/24 Yes [name], MD  bumetanide (BUMEX) 0.5 MG tablet Take 0.5 mg by mouth 2 (two) times daily.   Yes [provider]  carboxymethylcellulose (REFRESH PLUS) 0.5 % SOLN Place 1 drop into both eyes 2 (two) times daily.   Yes [provider]  diclofenac Sodium (VOLTAREN) 1 % GEL Apply 4 g topically 4 (four) times daily. To bilateral feet and knees Patient taking differently: Apply 4 g topically 4 (four) times daily as needed (pain). To bilateral feet and knees 11/06/22  Yes Gonfa, Taye T, MD  estradiol (ESTRACE) 0.1 MG/GM vaginal cream Place 1 Applicatorful vaginally daily. 04/21/23  Yes [provider]  gabapentin (NEURONTIN) 300 MG  capsule Take 300 mg by mouth 2 (two) times daily.    Yes [provider]  levothyroxine (SYNTHROID) 100 MCG tablet Take 100 mcg by mouth daily before breakfast. 09/05/21  Yes [provider]  Multiple Vitamins-Minerals (PRESERVISION/LUTEIN) CAPS Take 1 capsule by mouth 2 (two) times daily.    Yes [provider]  ondansetron (ZOFRAN) 4 MG tablet Take 4 mg by mouth daily as needed. 04/17/23  Yes [provider]  OVER THE COUNTER MEDICATION Take 1 capsule by mouth at bedtime. Equate Neuriva   Yes [provider]  pantoprazole (PROTONIX) 40 MG tablet Take 40 mg by mouth daily.   Yes [provider]  potassium chloride (KLOR-CON) 10 MEQ  tablet Take 1 tablet by mouth daily. 04/09/23  Yes [provider]  senna-docusate (SENOKOT-S) 8.6-50 MG tablet Take 1 tablet by mouth 2 (two) times daily between meals as needed for mild constipation. Patient taking differently: Take 1 tablet by mouth at bedtime. 11/06/22  Yes Almon Hercules, MD  spironolactone (ALDACTONE) 25 MG tablet Take 0.5 tablets (12.5 mg total) by mouth daily. 12/19/22  Yes Alver Sorrow, NP  warfarin (COUMADIN) 1 MG tablet Take 1-2 mg by mouth as directed. 1 MG (Mon,Wed,Fri) & 2 MG on (Tues,Thurs,Sat,Sun)   Yes [provider]  cholecalciferol (VITAMIN D3) 25 MCG (1000 UT) tablet Take 1,000 Units by mouth daily. Patient not taking: Reported on 05/06/2023    [provider]  doxycycline (VIBRAMYCIN) 100 MG capsule Take 100 mg by mouth 2 (two) times daily. Patient not taking: Reported on 05/06/2023 03/30/23   [provider]  fluconazole (DIFLUCAN) 150 MG tablet Take 150 mg by mouth once. Patient not taking: Reported on 05/06/2023 04/21/23   [provider]  levofloxacin (LEVAQUIN) 750 MG tablet Take 750 mg by mouth daily. Patient not taking: Reported on 05/06/2023 03/26/23   [provider]  vitamin B-12 (CYANOCOBALAMIN) 1000 MCG tablet Take 1,000 mcg by mouth daily. Patient not taking: Reported on 05/06/2023    [provider]  zinc gluconate 50 MG tablet Take 50 mg by mouth daily.    [provider]    Discontinued Meds:   Medications Discontinued During This Encounter  Medication Reason   polyethylene glycol powder (MIRALAX) 17 GM/SCOOP powder Patient Preference   potassium chloride SA (KLOR-CON M) 20 MEQ tablet Discontinued by provider   ceFEPIme (MAXIPIME) 2 g in sodium chloride 0.9 % 100 mL IVPB    vancomycin (VANCOCIN) IVPB 1000 mg/200 mL premix    cefTRIAXone (ROCEPHIN) 1 g in sodium chloride 0.9 % 100 mL IVPB    cefTAZidime (FORTAZ) 2 g in sodium chloride 0.9 % 100 mL IVPB    spironolactone  (ALDACTONE) tablet 12.5 mg    bumetanide (BUMEX) tablet 0.5 mg    0.9 %  sodium chloride infusion     Social History:  reports that she quit smoking about 21 years ago. Her smoking use included cigarettes. She has never used smokeless tobacco. She reports that she does not drink alcohol and does not use drugs.  Family History:   Family History  Problem Relation Age of Onset   Stroke Mother    Cancer Brother     A comprehensive review of systems was negative except for: pain in heels and associated with her leg wounds  Blood pressure (!) 110/45, pulse 66, temperature 98.1 F (36.7 C), temperature source Oral, resp. rate 18, height 5\' 3"  (1.6 m), weight 70.7 kg, SpO2 100%.  General appearance: cachectic, fatigued, pale, and did arouse when prompted but overall not that interactive Resp: diminished breath sounds bibasilar Cardio: regular rate and rhythm and systolic murmur: holosystolic 2/6, crescendo at 2nd left intercostal space GI: soft, non-tender; bowel sounds normal; no masses,  no organomegaly Extremities: edema 1+ and abrasions-  tegaderm in place-  some erythema On O2  Labs: Basic Metabolic Panel: Recent Labs  Lab 05/05/23 2255 05/06/23 0740 05/07/23 0325 05/08/23 0323 05/09/23 0409 05/09/23 1104  NA 125* 127* 131* 124* 119* 123*  K 3.6 3.5 3.6 3.6 3.4* 3.5  CL 88* 90* 95* 89* 86* 88*  CO2 27 28 27 26 26 26   GLUCOSE 93 106* 87 102* 94 101*  BUN 29* 30* 35* 29* 20 20  CREATININE 1.29* 1.18* 1.16* 0.97 1.01* 0.91  ALBUMIN 2.8*  --   --   --   --   --   CALCIUM 8.5* 8.6* 8.6* 8.2* 8.6* 8.9   Liver Function Tests: Recent Labs  Lab 05/05/23 2255  AST 29  ALT 15  ALKPHOS 122  BILITOT 0.8  PROT 6.0*  ALBUMIN 2.8*   No results for input(s): "LIPASE", "AMYLASE" in the last 168 hours. No results for input(s): "AMMONIA" in the last 168 hours. CBC: Recent Labs  Lab 05/05/23 2255 05/06/23 0740 05/07/23 0325 05/07/23 1738 05/08/23 0323 05/09/23 0409  WBC 8.1  9.5 6.4  --  10.5 6.9  NEUTROABS 5.4  --   --   --  8.1* 4.5  HGB 8.2* 7.7* 6.5* 7.7* 7.2* 7.2*  HCT 25.3* 23.5* 20.3* 23.6* 21.8* 21.9*  MCV 95.5 95.9 95.8  --  94.8 94.4  PLT 205 183 170  --  184 161   PT/INR: @labrcntip (inr:5) Cardiac Enzymes: No results for input(s): "CKTOTAL", "CKMB", "CKMBINDEX", "TROPONINI" in the last 168 hours. CBG: No results for input(s): "GLUCAP" in the last 168 hours.  Iron Studies:  Recent Labs  Lab 05/06/23 0343  IRON 39  TIBC 211*  FERRITIN 97    Xrays/Other Studies: No results found.   Assessment/Plan: 89 year old WF with acute on chronic hyponatremia.   1) Hyponatremia -  sodium initially improved when she was on her loop diuretic.  She also has a history of inappropriately elevated urine osm BUT urine osm WAS appropriately low when on bumex.  Therefore there seems to be an element of SIADH normalized with bumex.  The most recent urine osm is pending.  Her urine sodium does not indicate hypovolemia-  I think if anything is euvolemic to hypervolemic.    Since she possibly is having some MS change felt to be related to sodium I am going to give one dose of samsca today to induce aquaresis and sodium correction.  Then will resume bumex formaintenance  Cecille Aver 05/09/2023, 1:06 PM

## 2023-05-09 NOTE — Progress Notes (Signed)
Progress Note    [name]   ZOX:[id number]  DOB: 1928-04-19  DOA: 05/05/2023     3 PCP: Camie Patience, FNP  Initial CC: left leg redness  Hospital Course: Ms. Wojick is a 89 yo female with PMH afib, PAD, HTN, CHF, AS who presented with worsening redness and pain in her left leg.  She also follows with outpatient wound care center for bilateral wounds on her feet.  On admission, there was concern for left lower extremity cellulitis and she was started on antibiotics and admitted for further monitoring.  She also underwent further workup for other infectious etiologies.  UA was consistent with large LE, greater than 50 WBC.  Of note, she also had recent urinary testing outpatient on 04/21/2023 with urine specimens growing Pseudomonas with some resistance.  Daughter states patient was not treated and she was endorsing symptoms on admission.  Interval History:  She was noted to have more confusion overnight and some agitation. Given some haldol as well. Na dropped further and now concern for her becoming symptomatic from this and development of SIADH.  She seemed about the same she has been but is a little more forgetful about what's going on and why she's still in the hospital. She did know her name and that she's at [place].    Assessment and Plan: * Cellulitis of leg, left - consistent with cellulitis; nonpurulent and likely moderate. Today the erythema and calor is present but improved - does have known PAD with recent LLE angiogram and angioplasty on 03/04/23 - s/p vanc/cefepime on admission - continue doxy mono for coverage - essentially is healed now; will finish total of 7 day course   ABLA (acute blood loss anemia) - Hgb drop, was down to 6.5 g/dL after admission - differential includes spontaneous bleeding 2/2 supratherapeutic INR (now improved naturally) vs occult GIB - FOBT positive; discussed with GI. Given high risk for anesthesia, will proceed with conservative measures  for now as risk outweighs benefit currently - Hgb 7.2 g/dL this am and remaining stable  UTI (urinary tract infection) - Per daughter, she has been symptomatic.  UA also consistent with probable infection given symptoms - [facility] with resistance pattern reviewed -No urine culture sent on admission; added on; followup - continue fortaz for coverage given prior Pseudo (sens reviewed)  Hyponatremia - now becoming more hyponatremic and more confused overnight, raising concern for symptomatic hyponatremia - was on bumex and spirono from 8/28-8/29 but stopped this once Na dropped to 124 on 8/30 and placed her on NS @ 50 - this morning Na now down to 119 after starting IVF yesterday; will d/c IVF and check urine studies and serum osmo. Suspicion and concern is for SIADH - nephrology also consulted for assistance   Permanent atrial fibrillation (HCC) - On Coumadin at home.  INR supratherapeutic on admission with some bruising noted on right arm -Pharmacy managing Coumadin, appreciate assistance  Severe aortic stenosis - not a TAVR candidate - followed by cardiology   Chronic kidney disease, stage 3b (HCC) - patient has history of CKD3b. 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   Old records reviewed in assessment of this patient  Antimicrobials: Cefepime 8/27 >> 8/28 Vanc 8/28 x 1 Ceftazidime 8/29 >> current  Doxy 8/29> current   DVT prophylaxis:     Code Status:   Code Status: DNR  Mobility Assessment (Last 72 Hours)      Mobility Assessment     Row Name 05/09/23 1004 05/09/23 0815 05/08/23 2000 05/08/23 1055 05/07/23 2255   Does patient have an order for bedrest or is patient medically unstable -- No - Continue assessment No - Continue assessment No - Continue assessment No - Continue assessment   What is the highest level of mobility based on the progressive mobility assessment? Level 4 (Walks with assist in room) - Balance while marching in place and cannot step forward and back - Complete Level 4 (Walks with assist in room) - Balance while marching in place and cannot step forward and back - Complete Level 4 (Walks with assist in room) - Balance while marching in place and cannot step forward and back - Complete Level 4 (Walks with assist in room) - Balance while marching in place and cannot step forward and back - Complete Level 4 (Walks with assist in room) - Balance while marching in place and cannot step forward and back - Complete    Row Name 05/07/23 1100 05/06/23 2006         Does patient have an order for bedrest or is patient medically unstable No - Continue assessment No - Continue assessment      What is the highest level of mobility based on the progressive mobility assessment? Level 4 (Walks with assist in room) - Balance while marching in place and cannot step forward and back - Complete Level 4 (Walks with assist in room) - Balance while marching in place and cannot step forward and back - Complete               Barriers to discharge: none Disposition Plan:  Home Status is: Inpt  Objective: Blood pressure (!) 110/45, pulse 66, temperature 98.1 F (36.7 C), temperature source Oral, resp. rate 18, height 5\' 3"  (1.6 m), weight 70.7 kg, SpO2 100%.  Examination:  Physical Exam Constitutional:      Appearance: Normal appearance.  HENT:     Head: Normocephalic and atraumatic.     Mouth/Throat:     Mouth: Mucous membranes are moist.  Eyes:     Extraocular Movements: Extraocular  movements intact.  Cardiovascular:     Rate and Rhythm: Normal rate and regular rhythm.  Pulmonary:     Effort: Pulmonary effort is normal. No respiratory distress.     Breath sounds: Normal breath sounds. No wheezing.  Abdominal:     General: Bowel sounds are normal. There is no distension.     Palpations: Abdomen is soft.     Tenderness: There is no abdominal tenderness.  Musculoskeletal:     Cervical back: Normal range of motion and neck supple.     Comments: Large bruise circumferential on right arm above elbow; left leg noted with lower half having erythema and calor (mild dolor). No open wounds or weeping associated with the cellulitis   Skin:    General: Skin is warm and dry.  Neurological:     General: No focal deficit present.     Mental Status: She is alert.     Comments: Oriented to name  and hospital   Psychiatric:        Mood and Affect: Mood normal.      Consultants:    Procedures:    Data Reviewed: Results for orders placed or performed during the hospital encounter of 05/05/23 (from the past 24 hour(s))  Protime-INR     Status: Abnormal   Collection Time: 05/09/23  4:09 AM  Result Value Ref Range   Prothrombin Time 29.2 (H) 11.4 - 15.2 seconds   INR 2.7 (H) 0.8 - 1.2  Basic metabolic panel     Status: Abnormal   Collection Time: 05/09/23  4:09 AM  Result Value Ref Range   Sodium 119 (LL) 135 - 145 mmol/L   Potassium 3.4 (L) 3.5 - 5.1 mmol/L   Chloride 86 (L) 98 - 111 mmol/L   CO2 26 22 - 32 mmol/L   Glucose, Bld 94 70 - 99 mg/dL   BUN 20 8 - 23 mg/dL   Creatinine, Ser 1.61 (H) 0.44 - 1.00 mg/dL   Calcium 8.6 (L) 8.9 - 10.3 mg/dL   GFR, Estimated 52 (L) >60 mL/min   Anion gap 7 5 - 15  CBC with Differential/Platelet     Status: Abnormal   Collection Time: 05/09/23  4:09 AM  Result Value Ref Range   WBC 6.9 4.0 - 10.5 K/uL   RBC 2.32 (L) 3.87 - 5.11 MIL/uL   Hemoglobin 7.2 (L) 12.0 - 15.0 g/dL   HCT 09.6 (L) 04.5 - 40.9 %   MCV 94.4 80.0 - 100.0  fL   MCH 31.0 26.0 - 34.0 pg   MCHC 32.9 30.0 - 36.0 g/dL   RDW 81.1 91.4 - 78.2 %   Platelets 161 150 - 400 K/uL   nRBC 0.0 0.0 - 0.2 %   Neutrophils Relative % 64 %   Neutro Abs 4.5 1.7 - 7.7 K/uL   Lymphocytes Relative 16 %   Lymphs Abs 1.1 0.7 - 4.0 K/uL   Monocytes Relative 10 %   Monocytes Absolute 0.7 0.1 - 1.0 K/uL   Eosinophils Relative 8 %   Eosinophils Absolute 0.5 0.0 - 0.5 K/uL   Basophils Relative 1 %   Basophils Absolute 0.1 0.0 - 0.1 K/uL   Immature Granulocytes 1 %   Abs Immature Granulocytes 0.05 0.00 - 0.07 K/uL  Magnesium     Status: None   Collection Time: 05/09/23  4:09 AM  Result Value Ref Range   Magnesium 1.9 1.7 - 2.4 mg/dL  Basic metabolic panel     Status: Abnormal   Collection Time: 05/09/23 11:04 AM  Result Value Ref Range   Sodium 123 (L) 135 - 145 mmol/L   Potassium 3.5 3.5 - 5.1 mmol/L   Chloride 88 (L) 98 - 111 mmol/L   CO2 26 22 - 32 mmol/L   Glucose, Bld 101 (H) 70 - 99 mg/dL   BUN 20 8 - 23 mg/dL   Creatinine, Ser 9.56 0.44 - 1.00 mg/dL   Calcium 8.9 8.9 - 21.3 mg/dL   GFR, Estimated 59 (L) >60 mL/min   Anion gap 9 5 - 15  TSH     Status: None   Collection Time: 05/09/23 11:04 AM  Result Value Ref Range   TSH 3.892 0.350 - 4.500 uIU/mL  Sodium, urine, random     Status: None   Collection Time: 05/09/23 11:15 AM  Result Value Ref Range   Sodium, Ur 16 mmol/L  Creatinine, urine, random     Status: None  Collection Time: 05/09/23 11:15 AM  Result Value Ref Range   Creatinine, Urine 60 mg/dL  BASELINE basic metabolic panel - IF NOT already drawn     Status: Abnormal   Collection Time: 05/09/23  4:01 PM  Result Value Ref Range   Sodium 122 (L) 135 - 145 mmol/L   Potassium 4.0 3.5 - 5.1 mmol/L   Chloride 90 (L) 98 - 111 mmol/L   CO2 25 22 - 32 mmol/L   Glucose, Bld 110 (H) 70 - 99 mg/dL   BUN 18 8 - 23 mg/dL   Creatinine, Ser 7.56 (H) 0.44 - 1.00 mg/dL   Calcium 8.6 (L) 8.9 - 10.3 mg/dL   GFR, Estimated 51 (L) >60 mL/min    Anion gap 7 5 - 15  Hepatic function panel (Baseline)     Status: Abnormal   Collection Time: 05/09/23  4:01 PM  Result Value Ref Range   Total Protein 5.2 (L) 6.5 - 8.1 g/dL   Albumin 2.3 (L) 3.5 - 5.0 g/dL   AST 28 15 - 41 U/L   ALT 17 0 - 44 U/L   Alkaline Phosphatase 104 38 - 126 U/L   Total Bilirubin 0.4 0.3 - 1.2 mg/dL   Bilirubin, Direct 0.1 0.0 - 0.2 mg/dL   Indirect Bilirubin 0.3 0.3 - 0.9 mg/dL    I have reviewed pertinent nursing notes, vitals, labs, and images as necessary. I have ordered labwork to follow up on as indicated.  I have reviewed the last notes from staff over past 24 hours. I have discussed patient's care plan and test results with nursing staff, CM/SW, and other staff as appropriate.  Time spent: Greater than 50% of the 55 minute visit was spent in counseling/coordination of care for the patient as laid out in the A&P.   LOS: 3 days   Lewie Chamber, MD [facility] 05/09/2023, 5:56 PM

## 2023-05-09 NOTE — Progress Notes (Signed)
PHARMACY NOTE:  ANTIMICROBIAL RENAL DOSAGE ADJUSTMENT  Current antimicrobial regimen includes a mismatch between antimicrobial dosage and estimated renal function.  As per policy approved by the Pharmacy & Therapeutics and Medical Executive Committees, the antimicrobial dosage will be adjusted accordingly.  Current antimicrobial dosage:  Ceftazidime 2gm IV every 24 hours  Indication: UTI   Renal Function:  Estimated Creatinine Clearance: 36.4 mL/min (by C-G formula based on SCr of 0.91 mg/dL).     Antimicrobial dosage has been changed to:  Ceftazidime 2gm IV every 12 hours   Thank you for allowing pharmacy to be a part of this patient's care.  Josefa Half, [facility] 05/09/2023 2:18 PM

## 2023-05-09 NOTE — Progress Notes (Addendum)
ANTICOAGULATION CONSULT NOTE - Follow Up Consult  Pharmacy Consult for Warfarin Indication: Hx VTE & Afib  Patient Measurements: Height: 5\' 3"  (160 cm) Weight: 70.7 kg (155 lb 13.8 oz) IBW/kg (Calculated) : 52.4 Heparin Dosing Weight:   Vital Signs: Temp: 97.5 F (36.4 C) (08/31 0413) Temp Source: Oral (08/31 0413) BP: 131/80 (08/31 0413) Pulse Rate: 60 (08/31 0413)  Labs: Recent Labs    05/07/23 0325 05/07/23 1738 05/08/23 0323 05/09/23 0409  HGB 6.5* 7.7* 7.2* 7.2*  HCT 20.3* 23.6* 21.8* 21.9*  PLT 170  --  184 161  LABPROT 32.1*  --  29.0* 29.2*  INR 3.1*  --  2.7* 2.7*  CREATININE 1.16*  --  0.97 1.01*    Estimated Creatinine Clearance: 32.8 mL/min (A) (by C-G formula based on SCr of 1.01 mg/dL (H)).   Medications:  Scheduled:   aspirin EC  81 mg Oral Daily   atorvastatin  10 mg Oral Daily   docusate sodium  100 mg Oral BID   doxycycline  100 mg Oral Q12H   estradiol  1 Applicatorful Vaginal QHS   gabapentin  300 mg Oral BID   levothyroxine  100 mcg Oral Q0600   mupirocin cream   Topical Daily   pantoprazole  40 mg Oral Daily   Warfarin - Pharmacist Dosing Inpatient   Does not apply q1600   Infusions:   cefTAZidime (FORTAZ)  IV 2 g (05/08/23 1734)    Assessment: [name] on chronic warfarin for afib + hx VTE. She is admitted with cellulitis and started on broad-spectrum antibiotics which could increase INR.  Pharmacy is consulted for inpatient warfarin dosing.   Home Warfarin 1mg  on MWF and 2mg  on TuThSaSu.  Last dose 8/26 ~10p.  Admit INR elevated at 4  - Warfarin held 8/27 & 8/28  Today, 05/09/2023: - INR = 2.7, remains therapeutic  - CBC: Hgb = 7.2; Plt WNL (s/p 1 unit PRBCs 8/29) - Per RN-  No s/s bleeding, but has large bruise on right upper forearm.   - Noted FOB positive.  Spoke with Dr. Lewie Chamber ok to continue warfarin.  Goal of Therapy:  INR 2-3 Monitor platelets by anticoagulation protocol: Yes   Plan:  - Warfarin 1 mg PO x 1 dose  today - Daily PT/INR, CBC - Monitor for signs and symptoms of bleeding.  Tacy Learn, PharmD Clinical Pharmacist 05/09/2023 9:06 AM

## 2023-05-09 NOTE — Evaluation (Signed)
Physical Therapy Evaluation Patient Details Name: [name] MRN: [id number] DOB: August 20, 1928 Today's Date: 05/09/2023  History of Present Illness  [name] is a 89 yo female with PMH afib, PAD, HTN, CHF, AS who presented 05/05/23 with worsening redness and pain in her left leg.   On admission, there was concern for left lower extremity cellulitis, dysuria.  Clinical Impression  Pt admitted with above diagnosis.  Pt currently with functional limitations due to the deficits listed below (see PT Problem List). Pt will benefit from acute skilled PT to increase their independence and safety with mobility to allow discharge.   The patient  is pleasantly confused. Repeatedly asking to get dressed and find her daughter.  The patient is very unsteady and  barely ambulated x 8'  using Rw, PT having to supportpatient to safely return to bed.  Patient's SPO2 on Ra with activity >91%. Placed back on @ L , HR 81-96.  Patient reportedly ambulated with Rw independently in the home.  Family not present.  Patient will benefit from continued inpatient follow up therapy, <3 hours/day, unless patient improves to functional level to return home.         If plan is discharge home, recommend the following: Two people to help with walking and/or transfers;A little help with bathing/dressing/bathroom;Assistance with cooking/housework;Assist for transportation   Can travel by private vehicle   No    Equipment Recommendations None recommended by PT  Recommendations for Other Services       Functional Status Assessment       Precautions / Restrictions Precautions Precautions: Fall Precaution Comments: incontinence Restrictions Weight Bearing Restrictions: No      Mobility  Bed Mobility Overal bed mobility: Needs Assistance Bed Mobility: Rolling, Supine to Sit, Sit to Supine Rolling: Contact guard assist, Used rails   Supine to sit: Min assist, HOB elevated, Used rails Sit to supine: Mod assist    General bed mobility comments: assist legs back onto bed    Transfers Overall transfer level: Needs assistance Equipment used: Rolling walker (2 wheels) Transfers: Sit to/from Stand Sit to Stand: Max assist, Mod assist           General transfer comment: mod forst tiome them max ,noted lgs jerking seond attempt and unable to fully stand    Ambulation/Gait Ambulation/Gait assistance: Max assist, +2 physical assistance, +2 safety/equipment Gait Distance (Feet): 8 Feet Assistive device: Rolling walker (2 wheels) Gait Pattern/deviations: Decreased dorsiflexion - left, Step-to pattern, Staggering left, Staggering right Gait velocity: decr     General Gait Details: mod to max support after  going 8' and turning around  to get back to bed quickly, noted Left foot drop  Stairs            Wheelchair Mobility     Tilt Bed    Modified Rankin (Stroke Patients Only)       Balance Overall balance assessment: Needs assistance Sitting-balance support: Bilateral upper extremity supported, Feet supported Sitting balance-Leahy Scale: Fair Sitting balance - Comments: fair initially then started having jerking of legs and trunk and leaning backwards   Standing balance support: Bilateral upper extremity supported, During functional activity, Reliant on assistive device for balance Standing balance-Leahy Scale: Poor                               Pertinent Vitals/Pain Pain Assessment Pain Assessment: No/denies pain    Home Living Family/patient expects to be discharged  to:: Private residence Living Arrangements: Children Available Help at Discharge: Family;Available 24 hours/day Type of Home: House Home Access: Level entry       Home Layout: One level Home Equipment: Agricultural consultant (2 wheels);BSC/3in1      Prior Function Prior Level of Function : Needs assist             Mobility Comments: pt states uses RW       Extremity/Trunk Assessment    Upper Extremity Assessment Upper Extremity Assessment:  (limited in shoulder elevation, bil.)    Lower Extremity Assessment Lower Extremity Assessment: RLE deficits/detail;LLE deficits/detail RLE Deficits / Details: grossly  4/5 dorsiflexion LLE Deficits / Details: noted foot  drop, edema and redness when dependent    Cervical / Trunk Assessment Cervical / Trunk Assessment: Kyphotic  Communication   Communication Communication: Difficulty following commands/understanding Following commands: Follows one step commands with increased time Cueing Techniques: Verbal cues;Gestural cues  Cognition Arousal: Alert   Overall Cognitive Status: No family/caregiver present to determine baseline cognitive functioning Area of Impairment: Orientation, Memory, Safety/judgement, Awareness                 Orientation Level: Place, Time, Situation   Memory: Decreased recall of precautions, Decreased short-term memory   Safety/Judgement: Decreased awareness of safety, Decreased awareness of deficits     General Comments: continuously asking to get her clthes on to go home, asking where her daughter is?        General Comments      Exercises     Assessment/Plan    PT Assessment Patient needs continued PT services  PT Problem List Decreased strength;Decreased activity tolerance;Decreased mobility;Decreased cognition;Decreased safety awareness;Decreased balance;Decreased knowledge of use of DME;Decreased knowledge of precautions;Decreased skin integrity       PT Treatment Interventions DME instruction;Therapeutic activities;Gait training;Therapeutic exercise;Functional mobility training;Cognitive remediation    PT Goals (Current goals can be found in the Care Plan section)  Acute Rehab PT Goals PT Goal Formulation: Patient unable to participate in goal setting Time For Goal Achievement: 05/23/23 Potential to Achieve Goals: Fair    Frequency Min 1X/week     Co-evaluation                AM-PAC PT "6 Clicks" Mobility  Outcome Measure Help needed turning from your back to your side while in a flat bed without using bedrails?: A Little Help needed moving from lying on your back to sitting on the side of a flat bed without using bedrails?: A Little Help needed moving to and from a bed to a chair (including a wheelchair)?: A Lot Help needed standing up from a chair using your arms (e.g., wheelchair or bedside chair)?: A Lot Help needed to walk in hospital room?: Total Help needed climbing 3-5 steps with a railing? : Total 6 Click Score: 12    End of Session Equipment Utilized During Treatment: Gait belt Activity Tolerance: Patient tolerated treatment well Patient left: in bed;with bed alarm set;with call bell/phone within reach Nurse Communication: Mobility status PT Visit Diagnosis: Unsteadiness on feet (R26.81);Other abnormalities of gait and mobility (R26.89);Difficulty in walking, not elsewhere classified (R26.2)    Time: 0920-0950 PT Time Calculation (min) (ACUTE ONLY): 30 min   Charges:   PT Evaluation $PT Eval Low Complexity: 1 Low PT Treatments $Gait Training: 8-22 mins PT General Charges $$ ACUTE PT VISIT: 1 Visit         Blanchard Kelch PT Acute Rehabilitation Services Office [phone] Weekend pager-[phone]  [name], [name] 05/09/2023, 10:06 AM

## 2023-05-10 ENCOUNTER — Inpatient Hospital Stay (HOSPITAL_COMMUNITY): Payer: Medicare HMO

## 2023-05-10 DIAGNOSIS — N3000 Acute cystitis without hematuria: Secondary | ICD-10-CM | POA: Diagnosis not present

## 2023-05-10 DIAGNOSIS — L03116 Cellulitis of left lower limb: Secondary | ICD-10-CM | POA: Diagnosis not present

## 2023-05-10 DIAGNOSIS — E871 Hypo-osmolality and hyponatremia: Secondary | ICD-10-CM | POA: Diagnosis not present

## 2023-05-10 DIAGNOSIS — D62 Acute posthemorrhagic anemia: Secondary | ICD-10-CM | POA: Diagnosis not present

## 2023-05-10 LAB — BASIC METABOLIC PANEL
Anion gap: 9 (ref 5–15)
Anion gap: 8 (ref 5–15)
BUN: 17 mg/dL (ref 8–23)
BUN: 18 mg/dL (ref 8–23)
CO2: 24 mmol/L (ref 22–32)
CO2: 25 mmol/L (ref 22–32)
Calcium: 8.8 mg/dL — ABNORMAL LOW (ref 8.9–10.3)
Calcium: 9 mg/dL (ref 8.9–10.3)
Chloride: 92 mmol/L — ABNORMAL LOW (ref 98–111)
Chloride: 95 mmol/L — ABNORMAL LOW (ref 98–111)
Creatinine, Ser: 1.12 mg/dL — ABNORMAL HIGH (ref 0.44–1.00)
Creatinine, Ser: 1.02 mg/dL — ABNORMAL HIGH (ref 0.44–1.00)
GFR, Estimated: 46 mL/min — ABNORMAL LOW (ref >60)
GFR, Estimated: 51 mL/min — ABNORMAL LOW (ref >60)
Glucose, Bld: 89 mg/dL (ref 70–99)
Glucose, Bld: 112 mg/dL — ABNORMAL HIGH (ref 70–99)
Potassium: 3.8 mmol/L (ref 3.5–5.1)
Potassium: 3.8 mmol/L (ref 3.5–5.1)
Sodium: 125 mmol/L — ABNORMAL LOW (ref 135–145)
Sodium: 128 mmol/L — ABNORMAL LOW (ref 135–145)

## 2023-05-10 LAB — CBC WITH DIFFERENTIAL/PLATELET
Abs Immature Granulocytes: 0.03 10*3/uL (ref 0.00–0.07)
Basophils Absolute: 0 10*3/uL (ref 0.0–0.1)
Basophils Relative: 1 %
Eosinophils Absolute: 0.4 10*3/uL (ref 0.0–0.5)
Eosinophils Relative: 9 %
HCT: 21 % — ABNORMAL LOW (ref 36.0–46.0)
Hemoglobin: 6.7 g/dL — CL (ref 12.0–15.0)
Immature Granulocytes: 1 %
Lymphocytes Relative: 18 %
Lymphs Abs: 0.9 10*3/uL (ref 0.7–4.0)
MCH: 30.3 pg (ref 26.0–34.0)
MCHC: 31.9 g/dL (ref 30.0–36.0)
MCV: 95 fL (ref 80.0–100.0)
Monocytes Absolute: 0.6 10*3/uL (ref 0.1–1.0)
Monocytes Relative: 12 %
Neutro Abs: 3 10*3/uL (ref 1.7–7.7)
Neutrophils Relative %: 59 %
Platelets: 166 10*3/uL (ref 150–400)
RBC: 2.21 MIL/uL — ABNORMAL LOW (ref 3.87–5.11)
RDW: 15 % (ref 11.5–15.5)
WBC: 4.9 10*3/uL (ref 4.0–10.5)
nRBC: 0 % (ref 0.0–0.2)

## 2023-05-10 LAB — HEMOGLOBIN AND HEMATOCRIT, BLOOD
HCT: 25.6 % — ABNORMAL LOW (ref 36.0–46.0)
Hemoglobin: 8.4 g/dL — ABNORMAL LOW (ref 12.0–15.0)

## 2023-05-10 LAB — PREPARE RBC (CROSSMATCH)

## 2023-05-10 LAB — PROTIME-INR
INR: 2.4 — ABNORMAL HIGH (ref 0.8–1.2)
Prothrombin Time: 26.6 s — ABNORMAL HIGH (ref 11.4–15.2)

## 2023-05-10 LAB — SODIUM: Sodium: 127 mmol/L — ABNORMAL LOW (ref 135–145)

## 2023-05-10 LAB — MAGNESIUM: Magnesium: 2 mg/dL (ref 1.7–2.4)

## 2023-05-10 MED ORDER — BUMETANIDE 0.5 MG PO TABS
0.5000 mg | ORAL_TABLET | Freq: Every day | ORAL | Status: DC
  Administered 2023-05-10 – 2023-05-11 (×2): 0.5 mg via ORAL
  Filled 2023-05-10 (×2): qty 1

## 2023-05-10 MED ORDER — SODIUM CHLORIDE 0.9% IV SOLUTION: Freq: Once | INTRAVENOUS | Status: AC

## 2023-05-10 MED ORDER — SODIUM CHLORIDE 0.9 % IV SOLN
2.0000 g | INTRAVENOUS | Status: DC
  Administered 2023-05-10 – 2023-05-11 (×2): 2 g via INTRAVENOUS
  Filled 2023-05-10 (×3): qty 2

## 2023-05-10 MED ORDER — WARFARIN SODIUM 2 MG PO TABS
2.0000 mg | ORAL_TABLET | Freq: Once | ORAL | Status: AC
  Administered 2023-05-10: 2 mg via ORAL
  Filled 2023-05-10: qty 1

## 2023-05-10 NOTE — Plan of Care (Signed)
  Problem: Activity: Goal: Risk for activity intolerance will decrease Outcome: Not Progressing   Problem: Elimination: Goal: Will not experience complications related to bowel motility Outcome: Not Progressing   Problem: Safety: Goal: Ability to remain free from injury will improve Outcome: Not Progressing

## 2023-05-10 NOTE — Progress Notes (Signed)
Progress Note    [name]   ZOX:[id number]  DOB: May 20, 1928  DOA: 05/05/2023     4 PCP: Camie Patience, FNP  Initial CC: left leg redness  Hospital Course: [name] is a 89 yo female with PMH afib, PAD, HTN, CHF, AS who presented with worsening redness and pain in her left leg.  She also follows with outpatient wound care center for bilateral wounds on her feet.  On admission, there was concern for left lower extremity cellulitis and she was started on antibiotics and admitted for further monitoring.  She also underwent further workup for other infectious etiologies.  UA was consistent with large LE, greater than 50 WBC.  Of note, she also had recent urinary testing outpatient on 04/21/2023 with urine specimens growing Pseudomonas with some resistance.  Daughter states patient was not treated and she was endorsing symptoms on admission.  Interval History:  Sleeping but awakens easily this morning. Mentation does seem improved and she had family visit last night too which she interacted well with.  No obvious bleeding at this time despite Hgb downtrend today. Getting 1 unit PRBC.   Assessment and Plan: * Cellulitis of leg, left - consistent with cellulitis; nonpurulent and likely moderate. Today the erythema and calor is present but improved - does have known PAD with recent LLE angiogram and angioplasty on 03/04/23 - s/p vanc/cefepime on admission - continue doxy mono for coverage - essentially is healed now; will finish total of 7 day course   ABLA (acute blood loss anemia) - Hgb drop, was down to 6.5 g/dL after admission - differential includes spontaneous bleeding 2/2 supratherapeutic INR (now improved without reversal) vs occult GIB - FOBT positive; discussed with GI. Given high risk for anesthesia, will proceed with conservative measures for now as risk outweighs benefit currently - INR therapeutic; Hgb drop noted again today, 6.7 g/dL. Received 1 unit PRBC and Hgb improved to  8.4 g/dL. Discussed with daughter.  - next plan is to obtain CTA A/P if Hgb does drop again in efforts to locate if possible; options would be ongoing observation vs consideration of discontinuing Coumadin but her stroke risk remains high off AC; (CHA2DS2-VASc = 6 pts) - for reference, also has high ATRIA bleed score, 7 points (5.8% annual risk hemorrhage) and high HAS-BLED score.   - This will have to be a risk/benefit discussion with patient/family as we continue. She has a fairly decent QOL and her mortality from stroke is almost as comparable to her mortality from bleeding risk. After my discussion with daughter (daily) and again today, 9/1, consensus is to continue Coumadin at this time especially since INR well controlled and therapeutic for now  UTI (urinary tract infection) - Per daughter, she has been symptomatic.  UA also consistent with probable infection given symptoms - [facility] Pseud[facility] with resistance pattern reviewed -No urine culture sent on admission; added on; followup - continue fortaz for coverage given prior Pseudo (sens reviewed); goal 5 days of treatment (now D4/5)  Hyponatremia - became more hyponatremic morning of 8/31 and more confused, raising concern for symptomatic hyponatremia - was on bumex and spirono from 8/28-8/29 but stopped this once Na dropped to 124 on 8/30 and placed her on NS @ 50; morning of 8/31 Na down to 119 after starting IVF on 8/30; fluids d/c'd and further workup commenced. Suspicion and concern is for SIADH - nephrology also consulted for assistance  - Na improving, s/p tolvaptan  dose on 8/31 - bumex resumed 9/1 per nephrology  - continuing to trend BMP and mentation (improved)  Permanent atrial fibrillation (HCC) - On Coumadin at home.  INR supratherapeutic on admission with some bruising noted on right arm -Pharmacy managing Coumadin, appreciate assistance  Severe aortic stenosis - not a TAVR candidate -  followed by cardiology   Chronic kidney disease, stage 3b (HCC) - patient has history of CKD3b. 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   Old records reviewed in assessment of this patient  Antimicrobials: Cefepime 8/27 >> 8/28 Vanc 8/28 x 1 Ceftazidime 8/29 >> current  Doxy 8/29> current   DVT prophylaxis:   warfarin (COUMADIN) tablet 2 mg   Code Status:   Code Status: DNR  Mobility Assessment (Last 72 Hours)     Mobility Assessment     Row Name 05/10/23 0733 05/09/23 2114 05/09/23 1004 05/09/23 0815 05/08/23 2000   Does patient have an order for bedrest or is patient medically unstable No - Continue assessment No - Continue assessment -- No - Continue assessment No - Continue assessment   What is the highest level of mobility based on the progressive mobility assessment? Level 4 (Walks with assist in room) - Balance while marching in place and cannot step forward and back - Complete Level 4 (Walks with assist in room) - Balance while marching in place and cannot step forward and back - Complete Level 4 (Walks with assist in room) - Balance while marching in place and cannot step forward and back - Complete Level 4 (Walks with assist in room) - Balance while marching in place and cannot step forward and back - Complete Level 4 (Walks with assist in room) - Balance while marching in place and cannot step forward and back - Complete    Row Name 05/08/23 1055 05/07/23 2255         Does patient have an order for bedrest or is patient medically unstable No - Continue  assessment No - Continue assessment      What is the highest level of mobility based on the progressive mobility assessment? Level 4 (Walks with assist in room) - Balance while marching in place and cannot step forward and back - Complete Level 4 (Walks with assist in room) - Balance while marching in place and cannot step forward and back - Complete               Barriers to discharge: none Disposition Plan:  Home Status is: Inpt  Objective: Blood pressure (!) 126/41, pulse (!) 54, temperature 98.2 F (36.8 C), temperature source Oral, resp. rate 15, height 5\' 3"  (1.6 m), weight 70.7 kg, SpO2 97%.  Examination:  Physical Exam Constitutional:      Appearance: Normal appearance.  HENT:     Head: Normocephalic and atraumatic.     Mouth/Throat:     Mouth: Mucous membranes are moist.  Eyes:     Extraocular Movements: Extraocular movements intact.  Cardiovascular:     Rate and Rhythm: Normal rate and regular rhythm.  Pulmonary:  Effort: Pulmonary effort is normal. No respiratory distress.     Breath sounds: Normal breath sounds. No wheezing.  Abdominal:     General: Bowel sounds are normal. There is no distension.     Palpations: Abdomen is soft.     Tenderness: There is no abdominal tenderness.  Musculoskeletal:     Cervical back: Normal range of motion and neck supple.     Comments: Large bruise circumferential on right arm above elbow (improving); LLE erythema now fully resolved (very mild calor)  Skin:    General: Skin is warm and dry.  Neurological:     General: No focal deficit present.     Mental Status: She is alert.     Comments: Oriented to name and hospital   Psychiatric:        Mood and Affect: Mood normal.      Consultants:  Nephrology   Procedures:    Data Reviewed: Results for orders placed or performed during the hospital encounter of 05/05/23 (from the past 24 hour(s))  BASELINE basic metabolic panel - IF NOT already drawn     Status: Abnormal    Collection Time: 05/09/23  4:01 PM  Result Value Ref Range   Sodium 122 (L) 135 - 145 mmol/L   Potassium 4.0 3.5 - 5.1 mmol/L   Chloride 90 (L) 98 - 111 mmol/L   CO2 25 22 - 32 mmol/L   Glucose, Bld 110 (H) 70 - 99 mg/dL   BUN 18 8 - 23 mg/dL   Creatinine, Ser 3.24 (H) 0.44 - 1.00 mg/dL   Calcium 8.6 (L) 8.9 - 10.3 mg/dL   GFR, Estimated 51 (L) >60 mL/min   Anion gap 7 5 - 15  Hepatic function panel (Baseline)     Status: Abnormal   Collection Time: 05/09/23  4:01 PM  Result Value Ref Range   Total Protein 5.2 (L) 6.5 - 8.1 g/dL   Albumin 2.3 (L) 3.5 - 5.0 g/dL   AST 28 15 - 41 U/L   ALT 17 0 - 44 U/L   Alkaline Phosphatase 104 38 - 126 U/L   Total Bilirubin 0.4 0.3 - 1.2 mg/dL   Bilirubin, Direct 0.1 0.0 - 0.2 mg/dL   Indirect Bilirubin 0.3 0.3 - 0.9 mg/dL  Basic metabolic panel     Status: Abnormal   Collection Time: 05/09/23 10:17 PM  Result Value Ref Range   Sodium 122 (L) 135 - 145 mmol/L   Potassium 3.6 3.5 - 5.1 mmol/L   Chloride 90 (L) 98 - 111 mmol/L   CO2 26 22 - 32 mmol/L   Glucose, Bld 111 (H) 70 - 99 mg/dL   BUN 18 8 - 23 mg/dL   Creatinine, Ser 4.01 (H) 0.44 - 1.00 mg/dL   Calcium 8.5 (L) 8.9 - 10.3 mg/dL   GFR, Estimated 45 (L) >60 mL/min   Anion gap 6 5 - 15  Basic metabolic panel     Status: Abnormal   Collection Time: 05/10/23  4:08 AM  Result Value Ref Range   Sodium 125 (L) 135 - 145 mmol/L   Potassium 3.8 3.5 - 5.1 mmol/L   Chloride 92 (L) 98 - 111 mmol/L   CO2 24 22 - 32 mmol/L   Glucose, Bld 89 70 - 99 mg/dL   BUN 17 8 - 23 mg/dL   Creatinine, Ser 0.27 (H) 0.44 - 1.00 mg/dL   Calcium 8.8 (L) 8.9 - 10.3 mg/dL   GFR, Estimated 46 (L) >60 mL/min  Anion gap 9 5 - 15  Protime-INR     Status: Abnormal   Collection Time: 05/10/23  4:08 AM  Result Value Ref Range   Prothrombin Time 26.6 (H) 11.4 - 15.2 seconds   INR 2.4 (H) 0.8 - 1.2  CBC with Differential/Platelet     Status: Abnormal   Collection Time: 05/10/23  4:08 AM  Result Value Ref  Range   WBC 4.9 4.0 - 10.5 K/uL   RBC 2.21 (L) 3.87 - 5.11 MIL/uL   Hemoglobin 6.7 (LL) 12.0 - 15.0 g/dL   HCT 64.4 (L) 03.4 - 74.2 %   MCV 95.0 80.0 - 100.0 fL   MCH 30.3 26.0 - 34.0 pg   MCHC 31.9 30.0 - 36.0 g/dL   RDW 59.5 63.8 - 75.6 %   Platelets 166 150 - 400 K/uL   nRBC 0.0 0.0 - 0.2 %   Neutrophils Relative % 59 %   Neutro Abs 3.0 1.7 - 7.7 K/uL   Lymphocytes Relative 18 %   Lymphs Abs 0.9 0.7 - 4.0 K/uL   Monocytes Relative 12 %   Monocytes Absolute 0.6 0.1 - 1.0 K/uL   Eosinophils Relative 9 %   Eosinophils Absolute 0.4 0.0 - 0.5 K/uL   Basophils Relative 1 %   Basophils Absolute 0.0 0.0 - 0.1 K/uL   Immature Granulocytes 1 %   Abs Immature Granulocytes 0.03 0.00 - 0.07 K/uL  Magnesium     Status: None   Collection Time: 05/10/23  4:08 AM  Result Value Ref Range   Magnesium 2.0 1.7 - 2.4 mg/dL  Prepare RBC (crossmatch)     Status: None   Collection Time: 05/10/23  5:08 AM  Result Value Ref Range   Order Confirmation      ORDER PROCESSED BY BLOOD BANK Performed at [facility], [address], [place], [place] [address]   Sodium     Status: Abnormal   Collection Time: 05/10/23  7:58 AM  Result Value Ref Range   Sodium 127 (L) 135 - 145 mmol/L  Basic metabolic panel     Status: Abnormal   Collection Time: 05/10/23 12:04 PM  Result Value Ref Range   Sodium 128 (L) 135 - 145 mmol/L   Potassium 3.8 3.5 - 5.1 mmol/L   Chloride 95 (L) 98 - 111 mmol/L   CO2 25 22 - 32 mmol/L   Glucose, Bld 112 (H) 70 - 99 mg/dL   BUN 18 8 - 23 mg/dL   Creatinine, Ser 5.18 (H) 0.44 - 1.00 mg/dL   Calcium 9.0 8.9 - 84.1 mg/dL   GFR, Estimated 51 (L) >60 mL/min   Anion gap 8 5 - 15  Hemoglobin and hematocrit, blood     Status: Abnormal   Collection Time: 05/10/23 12:04 PM  Result Value Ref Range   Hemoglobin 8.4 (L) 12.0 - 15.0 g/dL   HCT 66.0 (L) 63.0 - 16.0 %    I have reviewed pertinent nursing notes, vitals, labs, and images as necessary. I have ordered  labwork to follow up on as indicated.  I have reviewed the last notes from staff over past 24 hours. I have discussed patient's care plan and test results with nursing staff, CM/SW, and other staff as appropriate.  Time spent: Greater than 50% of the 55 minute visit was spent in counseling/coordination of care for the patient as laid out in the A&P.   LOS: 4 days   Lewie Chamber, MD [facility] 05/10/2023, 3:39  PM

## 2023-05-10 NOTE — Progress Notes (Signed)
PHARMACY NOTE:  ANTIMICROBIAL RENAL DOSAGE ADJUSTMENT  Current antimicrobial regimen includes a mismatch between antimicrobial dosage and estimated renal function.  As per policy approved by the Pharmacy & Therapeutics and Medical Executive Committees, the antimicrobial dosage will be adjusted accordingly.  Current antimicrobial dosage:  Ceftazidime 2gm IV q12h   Indication: UTI  Renal Function:  Estimated Creatinine Clearance: 29.3 mL/min (A) (by C-G formula based on SCr of 1.13 mg/dL (H)).      Antimicrobial dosage has been changed to:  Ceftazidime 2gm IV q24h   Thank you for allowing pharmacy to be a part of this patient's care.  Maryellen Pile, [facility] 05/10/2023 2:29 AM

## 2023-05-10 NOTE — Progress Notes (Signed)
ANTICOAGULATION CONSULT NOTE - Follow Up Consult  Pharmacy Consult for Warfarin Indication: Hx VTE & Afib  Patient Measurements: Height: 5\' 3"  (160 cm) Weight: 70.7 kg (155 lb 13.8 oz) IBW/kg (Calculated) : 52.4 Heparin Dosing Weight:   Vital Signs: Temp: 97.9 F (36.6 C) (09/01 0844) Temp Source: Oral (09/01 0844) BP: 137/56 (09/01 0844) Pulse Rate: 64 (09/01 0844)  Labs: Recent Labs    05/08/23 0323 05/09/23 0409 05/09/23 1104 05/09/23 1601 05/09/23 2217 05/10/23 0408  HGB 7.2* 7.2*  --   --   --  6.7*  HCT 21.8* 21.9*  --   --   --  21.0*  PLT 184 161  --   --   --  166  LABPROT 29.0* 29.2*  --   --   --  26.6*  INR 2.7* 2.7*  --   --   --  2.4*  CREATININE 0.97 1.01*   < > 1.02* 1.13* 1.12*   < > = values in this interval not displayed.    Estimated Creatinine Clearance: 29.6 mL/min (A) (by C-G formula based on SCr of 1.12 mg/dL (H)).   Medications:  Scheduled:   aspirin EC  81 mg Oral Daily   atorvastatin  10 mg Oral Daily   docusate sodium  100 mg Oral BID   doxycycline  100 mg Oral Q12H   estradiol  1 Applicatorful Vaginal QHS   gabapentin  300 mg Oral BID   levothyroxine  100 mcg Oral Q0600   mupirocin cream   Topical Daily   pantoprazole  40 mg Oral Daily   Warfarin - Pharmacist Dosing Inpatient   Does not apply q1600   Infusions:   cefTAZidime (FORTAZ)  IV      Assessment: [name] on chronic warfarin for afib + hx VTE. She is admitted with cellulitis and started on broad-spectrum antibiotics which could increase INR.  Pharmacy is consulted for inpatient warfarin dosing.   Home Warfarin 1mg  on MWF and 2mg  on TuThSaSu.  Last dose 8/26 ~10p.  Admit INR elevated at 4  - Warfarin held 8/27 & 8/28 - s/p 1 unit PRBCs 8/29 - s/p 1 unit PRBCs 9/1  Today, 05/10/2023: - INR = 2.4, remains therapeutic  - Hgb = 6.7 (transfusion ordered) - Plt = 166 WNL  - Per RN-  no signs of bleeding   - Noted FOB positive. Spoke with Dr. Lewie Chamber again 9/1 about  low hemoglobin and patient receiving transfusion.  He wants to continue warfarin today.  Goal of Therapy:  INR 2-3 Monitor platelets by anticoagulation protocol: Yes   Plan:  - Warfarin 2 mg PO x 1 dose today - Daily PT/INR, CBC - Monitor for signs and symptoms of bleeding.  Tacy Learn, PharmD Clinical Pharmacist 05/10/2023 9:11 AM

## 2023-05-10 NOTE — Progress Notes (Signed)
[facility] Progress Note  Name: [name] MRN: [id number] DOB: 1928/08/10  Chief Complaint:  Leg wounds   Subjective:  Interval improvement of sodium - she got a dose of tolvaptan on 8/31.  She had 1.4 liters UOP over 8/31 charted.  She didn't know that her lunch was in the room and it's 2:00 pm.  Review of systems:  She denies any shortness of breath  Can't remember if she is on oxygen at home but states that her daughter told her she was  No n/v ----------------- Background on consult:  [name] is an 89 y.o. female with HTN, Afib, hx PE on coumadin, AS, HFpEF, bilat LE wounds, possibly some dementia, lives with daughter.  Brought into he ER on 8/27 with complaints of cellulitis around one of her LE wounds-  admitted and started on abx.  Regarding sodium , it appears that she has some baseline hyponatremia- in the high 120's to low 130's.  On 04/02/23 was 126.  Upon admission on 8/27 was 125 but then improved to 131 by 8/29-  had been given an LR bolus on admit  but also  continued on her bumex and aldactone from 8/27 to 8/29- then on 8/30 for unclear reasons went down to 124-  so bumex and aldactone were stopped and NS was started -  na worsened to 119 this AM-  ns stopped-  no other action then later sodium check was 123.  Historically there was a urine osm that was 335.  When urine osm was checked on 8/28 ( on bumex)  it was 131-  one from today is pending.  TSH is WNL.  UOP has not been well recorded-  urine sodium and osm from today are pending.  Pt is very sleepy on my interview-  says she has been eating normally     Intake/Output Summary (Last 24 hours) at 05/10/2023 1346 Last data filed at 05/10/2023 1335 Gross per 24 hour  Intake 662 ml  Output 2550 ml  Net -1888 ml    Vitals:  Vitals:   05/10/23 0543 05/10/23 0558 05/10/23 0844 05/10/23 1300  BP: (!) 128/47 (!) 138/53 (!) 137/56 (!) 126/41  Pulse: 69 71 64 (!) 54  Resp: 20 20 18 15   Temp: 97.6 F  (36.4 C) 97.6 F (36.4 C) 97.9 F (36.6 C) 98.2 F (36.8 C)  TempSrc: Oral  Oral   SpO2: 100%  100% 97%  Weight:      Height:         Physical Exam:  General elderly female in bed in no acute distress HEENT normocephalic atraumatic extraocular movements intact sclera anicteric Neck supple trachea midline Lungs clear but reduced on auscultation bilaterally normal work of breathing at rest; on 2 liters oxygen Heart S1S2 no rub Abdomen soft nontender nondistended Extremities no edema appreciated Psych no anxiety or agitation Neuro asleep on arrival but awakens to voice. Oriented to person and location of a hospital. States year is 48 then 67.  Then guesses 2004   Medications reviewed   Labs:     Latest Ref Rng & Units 05/10/2023   12:04 PM 05/10/2023    7:58 AM 05/10/2023    4:08 AM  BMP  Glucose 70 - 99 mg/dL 409   89   BUN 8 - 23 mg/dL 18   17   Creatinine 8.11 - 1.00 mg/dL 9.14   7.82   Sodium 956 - 145 mmol/L 128  127  125  Potassium 3.5 - 5.1 mmol/L 3.8   3.8   Chloride 98 - 111 mmol/L 95   92   CO2 22 - 32 mmol/L 25   24   Calcium 8.9 - 10.3 mg/dL 9.0   8.8      Assessment/Plan:   89 year old WF with acute on chronic hyponatremia.    # Hyponatremia  - Hyponatremia initially improved when she was on her loop diuretic.  She also has a history of inappropriately elevated urine osm BUT urine osm WAS appropriately low when on bumex.  Therefore there seems to be an element of SIADH normalized with bumex per consulting nephrologist.  TSH ok.  - resume bumex at 0.5 mg daily for now (home dose reported as BID) - Would defer trazodone PRN - have discontinued and this does not appear to be a home medication   - on levothyroxine  # Normocytic anemia - s/p PRBC's  - Transfusion per primary team   # Dementia - diagnosis is per charting  - Appreciate family and staff supplementing her history    Would continue inpatient monitoring and consider discharge as early as 9/2  from a strictly renal standpoint - will give final recs at that time  Estanislado Emms, MD 05/10/2023 1:46 PM

## 2023-05-10 NOTE — TOC Progression Note (Signed)
Transition of Care [facility]) - Progression Note    Patient Details  Name: [name] MRN: [id number] Date of Birth: 1927/09/09  [facility] CM/SW Contact  Carmina Miller, LCSWA Phone Number: 05/10/2023, 1:16 PM  Clinical Narrative:     CSW spoke with pt's daughter [name] concerning SNF rec, she declines SNF, states she will be taking pt home. [name] states pt is active with [facility], agreeable to PT/OT if pt is eligible, requested to speak with MD, MD made aware.    Expected Discharge Plan: Home w Home Health Services Barriers to Discharge: No Barriers Identified  Expected Discharge Plan and Services In-house Referral: NA Discharge Planning Services: NA Post Acute Care Choice: Home Health (PT eval) Living arrangements for the past 2 months: Single Family Home                                       Social Determinants of Health (SDOH) Interventions SDOH Screenings   Food Insecurity: No Food Insecurity (05/06/2023)  Housing: Patient Declined (05/06/2023)  Transportation Needs: No Transportation Needs (05/06/2023)  Utilities: Not At Risk (05/06/2023)  Tobacco Use: Medium Risk (05/08/2023)    Readmission Risk Interventions    11/18/2021   12:55 PM  Readmission Risk Prevention Plan  Transportation Screening Complete  PCP or Specialist Appt within 3-5 Days Complete  HRI or Home Care Consult Complete  Social Work Consult for Recovery Care Planning/Counseling Complete  Palliative Care Screening Complete  Medication Review [employer]) Complete

## 2023-05-11 DIAGNOSIS — N3000 Acute cystitis without hematuria: Secondary | ICD-10-CM | POA: Diagnosis not present

## 2023-05-11 DIAGNOSIS — D62 Acute posthemorrhagic anemia: Secondary | ICD-10-CM | POA: Diagnosis not present

## 2023-05-11 DIAGNOSIS — E871 Hypo-osmolality and hyponatremia: Secondary | ICD-10-CM | POA: Diagnosis not present

## 2023-05-11 DIAGNOSIS — L03116 Cellulitis of left lower limb: Secondary | ICD-10-CM | POA: Diagnosis not present

## 2023-05-11 LAB — BPAM RBC
Blood Product Expiration Date: 202409202359
Blood Product Expiration Date: 202409222359
ISSUE DATE / TIME: 202408291039
ISSUE DATE / TIME: 202409010538
Unit Type and Rh: 6200
Unit Type and Rh: 6200

## 2023-05-11 LAB — RENAL FUNCTION PANEL
Albumin: 2.5 g/dL — ABNORMAL LOW (ref 3.5–5.0)
Anion gap: 6 (ref 5–15)
BUN: 17 mg/dL (ref 8–23)
CO2: 28 mmol/L (ref 22–32)
Calcium: 9.4 mg/dL (ref 8.9–10.3)
Chloride: 96 mmol/L — ABNORMAL LOW (ref 98–111)
Creatinine, Ser: 1.07 mg/dL — ABNORMAL HIGH (ref 0.44–1.00)
GFR, Estimated: 48 mL/min — ABNORMAL LOW (ref >60)
Glucose, Bld: 101 mg/dL — ABNORMAL HIGH (ref 70–99)
Phosphorus: 2.4 mg/dL — ABNORMAL LOW (ref 2.5–4.6)
Potassium: 3.4 mmol/L — ABNORMAL LOW (ref 3.5–5.1)
Sodium: 130 mmol/L — ABNORMAL LOW (ref 135–145)

## 2023-05-11 LAB — TYPE AND SCREEN
ABO/RH(D): A POS
Antibody Screen: NEGATIVE
Unit division: 0
Unit division: 0

## 2023-05-11 LAB — CULTURE, BLOOD (ROUTINE X 2)
Culture: NO GROWTH
Culture: NO GROWTH
Special Requests: ADEQUATE

## 2023-05-11 LAB — CBC WITH DIFFERENTIAL/PLATELET
Abs Immature Granulocytes: 0.05 10*3/uL (ref 0.00–0.07)
Basophils Absolute: 0 10*3/uL (ref 0.0–0.1)
Basophils Relative: 0 %
Eosinophils Absolute: 0.3 10*3/uL (ref 0.0–0.5)
Eosinophils Relative: 4 %
HCT: 26.4 % — ABNORMAL LOW (ref 36.0–46.0)
Hemoglobin: 8.4 g/dL — ABNORMAL LOW (ref 12.0–15.0)
Immature Granulocytes: 1 %
Lymphocytes Relative: 13 %
Lymphs Abs: 0.9 10*3/uL (ref 0.7–4.0)
MCH: 30.4 pg (ref 26.0–34.0)
MCHC: 31.8 g/dL (ref 30.0–36.0)
MCV: 95.7 fL (ref 80.0–100.0)
Monocytes Absolute: 1 10*3/uL (ref 0.1–1.0)
Monocytes Relative: 14 %
Neutro Abs: 4.9 10*3/uL (ref 1.7–7.7)
Neutrophils Relative %: 68 %
Platelets: 201 10*3/uL (ref 150–400)
RBC: 2.76 MIL/uL — ABNORMAL LOW (ref 3.87–5.11)
RDW: 16.1 % — ABNORMAL HIGH (ref 11.5–15.5)
WBC: 7.1 10*3/uL (ref 4.0–10.5)
nRBC: 0 % (ref 0.0–0.2)

## 2023-05-11 LAB — PROTIME-INR
INR: 2 — ABNORMAL HIGH (ref 0.8–1.2)
Prothrombin Time: 23.1 seconds — ABNORMAL HIGH (ref 11.4–15.2)

## 2023-05-11 LAB — MAGNESIUM: Magnesium: 2.3 mg/dL (ref 1.7–2.4)

## 2023-05-11 MED ORDER — SODIUM CHLORIDE 1 G PO TABS
1.0000 g | ORAL_TABLET | Freq: Three times a day (TID) | ORAL | Status: DC
  Administered 2023-05-11 – 2023-05-12 (×3): 1 g via ORAL
  Filled 2023-05-11 (×3): qty 1

## 2023-05-11 MED ORDER — WARFARIN SODIUM 2 MG PO TABS
2.0000 mg | ORAL_TABLET | Freq: Once | ORAL | Status: AC
  Administered 2023-05-11: 2 mg via ORAL
  Filled 2023-05-11: qty 1

## 2023-05-11 MED ORDER — BUMETANIDE 0.5 MG PO TABS
0.5000 mg | ORAL_TABLET | Freq: Two times a day (BID) | ORAL | Status: DC
  Filled 2023-05-11: qty 1

## 2023-05-11 NOTE — Progress Notes (Signed)
Progress Note    [name]   ZOX:[id number]  DOB: [birth year]  DOA: 05/05/2023     5 PCP: Camie Patience, FNP  Initial CC: left leg redness  Hospital Course: [name] is a 89 yo female with PMH afib, PAD, HTN, CHF, AS who presented with worsening redness and pain in her left leg.  She also follows with outpatient wound care center for bilateral wounds on her feet.  On admission, there was concern for left lower extremity cellulitis and she was started on antibiotics and admitted for further monitoring.  She also underwent further workup for other infectious etiologies.  UA was consistent with large LE, greater than 50 WBC.  Of note, she also had recent urinary testing outpatient on 04/21/2023 with urine specimens growing Pseudomonas with some resistance.  Daughter states patient was not treated and she was endorsing symptoms on admission.  Interval History:  No events overnight.  Still no reports of any further bleeding.  Awake and alert this morning.  Overall feeling okay and seems to be improving still. Tentative plan for discharge tomorrow after discussion with daughter.  Assessment and Plan: * Cellulitis of leg, left-resolved as of 05/11/2023 - consistent with cellulitis; nonpurulent and likely moderate. Today the erythema and calor is present but improved - does have known PAD with recent LLE angiogram and angioplasty on 03/04/23 - s/p vanc/cefepime on admission - continue doxy mono for coverage - essentially is healed now; will finish total of 7 day course   Hyponatremia - became more hyponatremic morning of 8/31 and more confused, raising concern for symptomatic hyponatremia - was on bumex and spirono from 8/28-8/29 but stopped this once Na dropped to 124 on 8/30 and placed her on NS @ 50; morning of 8/31 Na down to 119 after starting IVF on 8/30; fluids d/c'd and further workup commenced. Suspicion and concern is for SIADH - nephrology also consulted for assistance  - Na  improving, s/p tolvaptan dose on 8/31 - bumex resumed 9/1 per nephrology  - continuing to trend BMP and mentation (improved)  ABLA (acute blood loss anemia)-resolved as of 05/11/2023 - Hgb drop, was down to 6.5 g/dL after admission - differential includes spontaneous bleeding 2/2 supratherapeutic INR (now improved without reversal) vs occult GIB - FOBT positive; discussed with GI. Given high risk for anesthesia, will proceed with conservative measures for now as risk outweighs benefit currently - INR therapeutic; Hgb drop noted again today, 6.7 g/dL. Received 1 unit PRBC and Hgb improved to 8.4 g/dL. Discussed with daughter.  - next plan is to obtain CTA A/P if Hgb does drop again in efforts to locate if possible; options would be ongoing observation vs consideration of discontinuing Coumadin but her stroke risk remains high off AC; (CHA2DS2-VASc = 6 pts) - for reference, also has high ATRIA bleed score, 7 points (5.8% annual risk hemorrhage) and high HAS-BLED score.   - This will have to be a risk/benefit discussion with patient/family as we continue. She has a fairly decent QOL and her mortality from stroke is almost as comparable to her mortality from bleeding risk. After my discussion with daughter (daily) and again today, 9/1, consensus is to continue Coumadin at this time especially since INR well controlled and therapeutic for now - currently Hgb stable 8.4 g/dL. Will plan on outpatient repeat labwork at discharge  UTI (urinary tract infection)-resolved as of 05/11/2023 - Per daughter, she has been symptomatic.  UA also consistent with probable infection given symptoms -  [facility] Pseud[facility] with resistance pattern reviewed -No urine culture sent on admission; added on; followup - continue fortaz for coverage given prior Pseudo (sens reviewed); goal 5 days of treatment (now D5/5)  Permanent atrial fibrillation (HCC) - On Coumadin at home.  INR supratherapeutic  on admission with some bruising noted on right arm -Pharmacy managing Coumadin, appreciate assistance  Severe aortic stenosis - not a TAVR candidate - followed by cardiology   Chronic kidney disease, stage 3b (HCC) - patient has history of CKD3b. Baseline creat ~ 1.1 - 1.3, eGFR~ 39 - currently at baseline    Hypothyroidism - Continue Synthroid  Physical deconditioning - not unsurprising but patient hoping to return to prior level of living - will ask for PT eval; daughter still preferring for taking patient home at discharge.  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   Old records reviewed in assessment of this patient  Antimicrobials: Cefepime 8/27 >> 8/28 Vanc 8/28 x 1 Ceftazidime 8/29 >> current  Doxy 8/29> current   DVT prophylaxis:   warfarin (COUMADIN) tablet 2 mg   Code Status:   Code Status: DNR  Mobility Assessment (Last 72 Hours)     Mobility Assessment     Row Name 05/11/23 1524 05/11/23 0830 05/10/23 2207 05/10/23 0733 05/09/23 2114   Does patient have an order for bedrest or is patient medically unstable -- No - Continue assessment No - Continue assessment No - Continue assessment No - Continue assessment   What is the highest level of mobility based on the progressive mobility assessment? Level 4 (Walks with assist in room) - Balance while marching in place and cannot step forward and back - Complete Level 4 (Walks with assist in room) - Balance while marching in place and cannot step forward and back - Complete Level 4 (Walks with assist in room) - Balance while marching in place and cannot step forward and back - Complete Level 4 (Walks with assist in room) - Balance while marching in place and cannot step forward and back - Complete Level 4 (Walks with assist in room) - Balance while  marching in place and cannot step forward and back - Complete    Row Name 05/09/23 1004 05/09/23 0815 05/08/23 2000       Does patient have an order for bedrest or is patient medically unstable -- No - Continue assessment No - Continue assessment     What is the highest level of mobility based on the progressive mobility assessment? Level 4 (Walks with assist in room) - Balance while marching in place and cannot step forward and back - Complete Level 4 (Walks with assist in room) - Balance while marching in place and cannot step forward and back - Complete Level 4 (Walks with assist in room) - Balance while marching in place and cannot step forward and back - Complete              Barriers to discharge: none Disposition Plan:  Home Status is: Inpt  Objective: Blood pressure (!) 145/46, pulse 64, temperature 97.9 F (36.6 C), resp. rate 16, height 5\' 3"  (1.6 m), weight 70.7 kg, SpO2 100%.  Examination:  Physical Exam Constitutional:      Appearance: Normal appearance.  HENT:     Head: Normocephalic and atraumatic.  Mouth/Throat:     Mouth: Mucous membranes are moist.  Eyes:     Extraocular Movements: Extraocular movements intact.  Cardiovascular:     Rate and Rhythm: Normal rate and regular rhythm.  Pulmonary:     Effort: Pulmonary effort is normal. No respiratory distress.     Breath sounds: Normal breath sounds. No wheezing.  Abdominal:     General: Bowel sounds are normal. There is no distension.     Palpations: Abdomen is soft.     Tenderness: There is no abdominal tenderness.  Musculoskeletal:     Cervical back: Normal range of motion and neck supple.     Comments: Large bruise circumferential on right arm above elbow (improving); LLE erythema now fully resolved (very mild calor)  Skin:    General: Skin is warm and dry.  Neurological:     General: No focal deficit present.     Mental Status: She is alert.     Comments: Oriented to name and hospital    Psychiatric:        Mood and Affect: Mood normal.      Consultants:  Nephrology   Procedures:    Data Reviewed: Results for orders placed or performed during the hospital encounter of 05/05/23 (from the past 24 hour(s))  Protime-INR     Status: Abnormal   Collection Time: 05/11/23  3:38 AM  Result Value Ref Range   Prothrombin Time 23.1 (H) 11.4 - 15.2 seconds   INR 2.0 (H) 0.8 - 1.2  Magnesium     Status: None   Collection Time: 05/11/23  3:38 AM  Result Value Ref Range   Magnesium 2.3 1.7 - 2.4 mg/dL  CBC with Differential/Platelet     Status: Abnormal   Collection Time: 05/11/23  7:11 AM  Result Value Ref Range   WBC 7.1 4.0 - 10.5 K/uL   RBC 2.76 (L) 3.87 - 5.11 MIL/uL   Hemoglobin 8.4 (L) 12.0 - 15.0 g/dL   HCT 16.0 (L) 10.9 - 32.3 %   MCV 95.7 80.0 - 100.0 fL   MCH 30.4 26.0 - 34.0 pg   MCHC 31.8 30.0 - 36.0 g/dL   RDW 55.7 (H) 32.2 - 02.5 %   Platelets 201 150 - 400 K/uL   nRBC 0.0 0.0 - 0.2 %   Neutrophils Relative % 68 %   Neutro Abs 4.9 1.7 - 7.7 K/uL   Lymphocytes Relative 13 %   Lymphs Abs 0.9 0.7 - 4.0 K/uL   Monocytes Relative 14 %   Monocytes Absolute 1.0 0.1 - 1.0 K/uL   Eosinophils Relative 4 %   Eosinophils Absolute 0.3 0.0 - 0.5 K/uL   Basophils Relative 0 %   Basophils Absolute 0.0 0.0 - 0.1 K/uL   Immature Granulocytes 1 %   Abs Immature Granulocytes 0.05 0.00 - 0.07 K/uL  Renal function panel     Status: Abnormal   Collection Time: 05/11/23  7:11 AM  Result Value Ref Range   Sodium 130 (L) 135 - 145 mmol/L   Potassium 3.4 (L) 3.5 - 5.1 mmol/L   Chloride 96 (L) 98 - 111 mmol/L   CO2 28 22 - 32 mmol/L   Glucose, Bld 101 (H) 70 - 99 mg/dL   BUN 17 8 - 23 mg/dL   Creatinine, Ser 4.27 (H) 0.44 - 1.00 mg/dL   Calcium 9.4 8.9 - 06.2 mg/dL   Phosphorus 2.4 (L) 2.5 - 4.6 mg/dL   Albumin 2.5 (L) 3.5 - 5.0 g/dL  GFR, Estimated 48 (L) >60 mL/min   Anion gap 6 5 - 15    I have reviewed pertinent nursing notes, vitals, labs, and images as  necessary. I have ordered labwork to follow up on as indicated.  I have reviewed the last notes from staff over past 24 hours. I have discussed patient's care plan and test results with nursing staff, CM/SW, and other staff as appropriate.    LOS: 5 days   Lewie Chamber, MD [facility] 05/11/2023, 5:24 PM

## 2023-05-11 NOTE — Progress Notes (Addendum)
ANTICOAGULATION CONSULT NOTE - Follow Up Consult  Pharmacy Consult for Warfarin Indication: Hx VTE & Afib  Patient Measurements: Height: 5\' 3"  (160 cm) Weight: 70.7 kg (155 lb 13.8 oz) IBW/kg (Calculated) : 52.4 Heparin Dosing Weight:   Vital Signs: Temp: 97.8 F (36.6 C) (09/02 0414) BP: 159/50 (09/02 0414) Pulse Rate: 69 (09/02 0414)  Labs: Recent Labs    05/09/23 0409 05/09/23 1104 05/09/23 2217 05/10/23 0408 05/10/23 1204 05/11/23 0338  HGB 7.2*  --   --  6.7* 8.4*  --   HCT 21.9*  --   --  21.0* 25.6*  --   PLT 161  --   --  166  --   --   LABPROT 29.2*  --   --  26.6*  --  23.1*  INR 2.7*  --   --  2.4*  --  2.0*  CREATININE 1.01*   < > 1.13* 1.12* 1.02*  --    < > = values in this interval not displayed.    Estimated Creatinine Clearance: 32.5 mL/min (A) (by C-G formula based on SCr of 1.02 mg/dL (H)).   Medications:  Scheduled:   aspirin EC  81 mg Oral Daily   atorvastatin  10 mg Oral Daily   bumetanide  0.5 mg Oral Daily   docusate sodium  100 mg Oral BID   doxycycline  100 mg Oral Q12H   estradiol  1 Applicatorful Vaginal QHS   gabapentin  300 mg Oral BID   levothyroxine  100 mcg Oral Q0600   mupirocin cream   Topical Daily   pantoprazole  40 mg Oral Daily   Warfarin - Pharmacist Dosing Inpatient   Does not apply q1600   Infusions:   cefTAZidime (FORTAZ)  IV 2 g (05/10/23 1626)    Assessment: [name] on chronic warfarin for afib + hx VTE. She is admitted with cellulitis and started on broad-spectrum antibiotics which could increase INR.  Pharmacy is consulted for inpatient warfarin dosing.   Home Warfarin 1mg  on MWF and 2mg  on TuThSaSu.  PTA last dose  8/26 ~10p.  Admit INR elevated at 4  - Warfarin held 8/27 & 8/28 - s/p 1 unit PRBCs 8/29 - s/p 1 unit PRBCs 9/1  Today, 05/11/2023: - INR = 2, now at low-end of therapeutic   - Hgb: 8.4, s/p transfusion 9/1 - Plt: 201 WNL - RN noted bruising on right upper arm and right and left hand from blood  draw; no other signs of bleeding noted.   Previous discussion: - 8/31 Noted FOB positive, Dr Frederick Peers aware and wants to continue warfarin. - 9/1 Spoke with Dr. Lewie Chamber again FOB positive & low hemoglobin, requiring transfusion 9/1.  He wants to continue warfarin.  Nurse reported no rectal bleeding or melanotic stool.   Goal of Therapy:  INR 2-3 Monitor platelets by anticoagulation protocol: Yes   Plan:  - Warfarin 2 mg PO x 1 dose today - Daily PT/INR - CBC ordered for tomorrow - Monitor for signs and symptoms of bleeding.  Tacy Learn, PharmD Clinical Pharmacist 05/11/2023 7:18 AM

## 2023-05-11 NOTE — Plan of Care (Signed)

## 2023-05-11 NOTE — Progress Notes (Signed)
Physical Therapy Treatment Patient Details Name: [name] MRN: [id number] DOB: 1928-02-19 Today's Date: 05/11/2023   History of Present Illness [name] is a 89 yo female with PMH afib, PAD, HTN, CHF, AS who presented 05/05/23 with worsening redness and pain in her left leg.   On admission, there was concern for left lower extremity cellulitis, dyuria.    PT Comments  The patient remains pleasantly confused.  Patient demonstrates improved mobility and able to stand and  step to [name]. Mobility very limited due to  urinary incontinence, immediately upon standing. Will need  to get   incontinence pads next visit.    If plan is discharge home, recommend the following: Two people to help with walking and/or transfers;A little help with bathing/dressing/bathroom;Assistance with cooking/housework;Assist for transportation   Can travel by private vehicle     No  Equipment Recommendations  None recommended by PT    Recommendations for Other Services       Precautions / Restrictions Precautions Precautions: Fall Precaution Comments: incontinence, svere of bladder Restrictions Weight Bearing Restrictions: No     Mobility  Bed Mobility   Bed Mobility: Supine to Sit Rolling: Min assist         General bed mobility comments: patient able to move legs and sit upright    Transfers Overall transfer level: Needs assistance Equipment used: Rolling walker (2 wheels) Transfers: Sit to/from Stand Sit to Stand: Mod assist           General transfer comment: mod support to steady, immediate incontinence and sat down to bed. Stood again and stepped to [name] holding onto RW. Stood from [name], recliner brought up.    Ambulation/Gait                   Stairs             Wheelchair Mobility     Tilt Bed    Modified Rankin (Stroke Patients Only)       Balance Overall balance assessment: Needs assistance Sitting-balance support: Bilateral upper extremity supported,  Feet supported Sitting balance-Leahy Scale: Fair     Standing balance support: Bilateral upper extremity supported, During functional activity, Reliant on assistive device for balance Standing balance-Leahy Scale: Fair Standing balance comment: reliant on UE                            Cognition Arousal: Alert Behavior During Therapy: WFL for tasks assessed/performed Overall Cognitive Status: No family/caregiver present to determine baseline cognitive functioning Area of Impairment: Orientation, Memory, Safety/judgement, Awareness                               General Comments: states she has not eaten lunch( did get a tray and declined it per nsg.) no memeory of recent  time. does follow directions        Exercises      General Comments        Pertinent Vitals/Pain Pain Assessment Pain Assessment: Faces Faces Pain Scale: Hurts little more Pain Location: spot on back where therapist bumped her during trnasfer Pain Descriptors / Indicators: Discomfort Pain Intervention(s): Heat applied    Home Living                          Prior Function  PT Goals (current goals can now be found in the care plan section) Progress towards PT goals: Progressing toward goals    Frequency    Min 1X/week      PT Plan      Co-evaluation              AM-PAC PT "6 Clicks" Mobility   Outcome Measure  Help needed turning from your back to your side while in a flat bed without using bedrails?: A Little Help needed moving from lying on your back to sitting on the side of a flat bed without using bedrails?: A Little Help needed moving to and from a bed to a chair (including a wheelchair)?: A Little Help needed standing up from a chair using your arms (e.g., wheelchair or bedside chair)?: A Little Help needed to walk in hospital room?: Total Help needed climbing 3-5 steps with a railing? : Total 6 Click Score: 14    End of  Session Equipment Utilized During Treatment: Gait belt Activity Tolerance: Patient tolerated treatment well Patient left: in chair;with call bell/phone within reach;with chair alarm set Nurse Communication: Mobility status PT Visit Diagnosis: Unsteadiness on feet (R26.81);Other abnormalities of gait and mobility (R26.89);Difficulty in walking, not elsewhere classified (R26.2)     Time: 1430-1510 PT Time Calculation (min) (ACUTE ONLY): 40 min  Charges:    $Therapeutic Activity: 23-37 mins $Self Care/Home Management: 8-22 PT General Charges $$ ACUTE PT VISIT: 1 Visit                     [name] PT Acute Rehabilitation Services Office [phone] Weekend pager-[phone]    [name], [place] 05/11/2023, 3:28 PM

## 2023-05-11 NOTE — Progress Notes (Addendum)
[facility] Progress Note  Name: [name] MRN: [id number] DOB: [birth year]  Chief Complaint:  Leg wounds   Subjective: Na+ 130 this am. No c/o's, wants to go home.   Na 125 on 8/27 --> got 1 liter LR bolus. On 8/28 Na+ was 127 --> then 131 on 8/29. Na+ then went down to 124 on 8/30. NS 0.9% started --> then down to 119 on 8/31 am.  Bumex and aldactone were stopped. She rec'd tolvaptan 15mg  x 1 on 8/31 and on 9/01 Na+ was 125 and 128, and today 9/02 am Na+ is 130.    Review of systems:  She denies any shortness of breath  Can't remember if she is on oxygen at home but states that her daughter told her she was  No n/v ----------------- Background on consult:  [name] is an 89 y.o. female with HTN, Afib, hx PE on coumadin, AS, HFpEF, bilat LE wounds, possibly some dementia, lives with daughter.  Brought into he ER on 8/27 with complaints of cellulitis around one of her LE wounds-  admitted and started on abx.    Physical Exam:  General elderly female in bed in no acute distress HEENT normocephalic atraumatic extraocular movements intact sclera anicteric Neck supple trachea midline Lungs clear but reduced on auscultation bilaterally normal work of breathing at rest; on 2 liters oxygen Heart S1S2 no rub Abdomen soft nontender nondistended Extremities no edema appreciated Psych no anxiety or agitation Neuro asleep on arrival but awakens to voice. Oriented to person and location of a hospital. States year is 56 then 51.  Then guesses 2004   Assessment/Plan:     8/28 urine labs --> UNa 22, UOsm 131   8/31 urine labs --> UNa 16, UOsm 74  89 year old WF with acute on chronic hyponatremia.    # Hyponatremia  - Na+ was 125 on 8/27 admission, then improved after 1 liter LR bolus up to 127 on 8/28 and 131 on 8/29. Bumex and aldactone were stopped and NS 0.9% at 50 cc/hr was started. Then Na+ dropped to 124 the next day 8/30 so NS 0.9% was dc'd. Then Na+ on 8/31 was  down again to 8/31. Tolvaptan was given at 15mg  and the Na+ on 9/01 was up to 125 and today 9/02 Na+ is up to 130. Pt's hyponatremia is likely due to low solute (low Na and Osm on admission) +/- SIADH.  Pt is okay for d/c from renal standpoint. Would hold bumex/ aldactone for now. Recommend salt tabs 1gm tid x 1 week, use 1.2 liter fluid restriction for 1-2 wks and ^dietary protein (in general) . Pt should get labs checked in OP setting in 1-2 wks if possible. No further suggestions. Will sign off.   # Normocytic anemia - s/p PRBC's  - Transfusion per primary team   # Dementia - diagnosis is per charting      Maree Krabbe, MD 05/11/2023 2:36 PM

## 2023-05-12 DIAGNOSIS — N3000 Acute cystitis without hematuria: Secondary | ICD-10-CM | POA: Diagnosis not present

## 2023-05-12 DIAGNOSIS — D62 Acute posthemorrhagic anemia: Secondary | ICD-10-CM | POA: Diagnosis not present

## 2023-05-12 DIAGNOSIS — L03116 Cellulitis of left lower limb: Secondary | ICD-10-CM | POA: Diagnosis not present

## 2023-05-12 DIAGNOSIS — E871 Hypo-osmolality and hyponatremia: Secondary | ICD-10-CM | POA: Diagnosis not present

## 2023-05-12 LAB — PROTIME-INR
INR: 1.9 — ABNORMAL HIGH (ref 0.8–1.2)
Prothrombin Time: 22.4 s — ABNORMAL HIGH (ref 11.4–15.2)

## 2023-05-12 MED ORDER — WARFARIN SODIUM 1 MG PO TABS: 1.0000 mg | ORAL_TABLET | ORAL | Status: DC

## 2023-05-12 MED ORDER — SODIUM CHLORIDE 1 G PO TABS: 1.0000 g | ORAL_TABLET | Freq: Three times a day (TID) | ORAL | 0 refills | Status: AC

## 2023-05-12 MED ORDER — WARFARIN SODIUM 2 MG PO TABS
2.0000 mg | ORAL_TABLET | ORAL | Status: DC
  Filled 2023-05-12: qty 1

## 2023-05-12 NOTE — Progress Notes (Signed)
ANTICOAGULATION CONSULT NOTE - Follow Up Consult  Pharmacy Consult for Warfarin Indication: Hx VTE & Afib  Patient Measurements: Height: 5\' 3"  (160 cm) Weight: 70.7 kg (155 lb 13.8 oz) IBW/kg (Calculated) : 52.4 Heparin Dosing Weight:   Vital Signs: Temp: 97.7 F (36.5 C) (09/03 0454) BP: 149/55 (09/03 0553) Pulse Rate: 56 (09/03 0553)  Labs: Recent Labs    05/10/23 0408 05/10/23 1204 05/11/23 0338 05/11/23 0711 05/12/23 0336  HGB 6.7* 8.4*  --  8.4*  --   HCT 21.0* 25.6*  --  26.4*  --   PLT 166  --   --  201  --   LABPROT 26.6*  --  23.1*  --  22.4*  INR 2.4*  --  2.0*  --  1.9*  CREATININE 1.12* 1.02*  --  1.07*  --     Estimated Creatinine Clearance: 31 mL/min (A) (by C-G formula based on SCr of 1.07 mg/dL (H)).   Assessment: 64 yoF on chronic warfarin for afib + hx VTE. She is admitted with cellulitis and started on broad-spectrum antibiotics which could increase INR.  Pharmacy is consulted for inpatient warfarin dosing.   Home Warfarin 1mg  on MWF and 2mg  on TuThSaSu.  PTA last dose  8/26 ~10p.  Admit INR elevated at 4  - Warfarin held 8/27 & 8/28 - s/p 1 unit PRBCs 8/29 - s/p 1 unit PRBCs 9/1  Today, 05/12/2023: - INR = 1.9, almost at low-end of therapeutic   - Hgb: 8.4 (9/2) , s/p transfusion 9/1 - Plt: 201 (9/2) WNL - RN noted bruising on right upper arm and right and left hand from blood draw; no other signs of bleeding noted.   Goal of Therapy:  INR 2-3 Monitor platelets by anticoagulation protocol: Yes   Plan:  - resume PTA dose of 2 gm TTSS & 1 mg MWF Daily INR - Monitor for signs and symptoms of bleeding  Herby Abraham, Pharm.D Use secure chat for questions 05/12/2023 9:02 AM

## 2023-05-12 NOTE — Evaluation (Signed)
Occupational Therapy Evaluation Patient Details Name: [name] MRN: [id number] DOB: Dec 10, 1927 Today's Date: 05/12/2023   History of Present Illness Ms. Defrain is a 89 yo female with PMH afib, PAD, HTN, CHF, AS who presented 05/05/23 with worsening redness and pain in her left leg.   On admission, there was concern for left lower extremity cellulitis, dyuria.   Clinical Impression   Patient is a 89 year old female who was admitted for above. Patient was living at home independently per patient report.  Patient was noted to have consistent urinary incontinence with standing with no awareness. Patient needed +2 for safety with RW in room with increased time for all tasks. Patient was noted to have decreased functional activity tolerance, decreased endurance, decreased standing balance, decreased safety awareness, and decreased knowledge of AD/AE impacting participation in ADLs. Patient will benefit from continued inpatient follow up therapy, <3 hours/day. Patient would continue to benefit from skilled OT services at this time while admitted and after d/c to address noted deficits in order to improve overall safety and independence in ADLs.        If plan is discharge home, recommend the following: Two people to help with walking and/or transfers;A lot of help with bathing/dressing/bathroom;Assistance with cooking/housework;Direct supervision/assist for medications management;Assist for transportation;Help with stairs or ramp for entrance;Direct supervision/assist for financial management    Functional Status Assessment  Patient has had a recent decline in their functional status and/or demonstrates limited ability to make significant improvements in function in a reasonable and predictable amount of time  Equipment Recommendations  None recommended by OT       Precautions / Restrictions Precautions Precautions: Fall Precaution Comments: incontinence, severe of bladder Restrictions Weight  Bearing Restrictions: No      Mobility Bed Mobility Overal bed mobility: Needs Assistance Bed Mobility: Supine to Sit     Supine to sit: Min assist, HOB elevated, Used rails              Balance Overall balance assessment: Needs assistance Sitting-balance support: Bilateral upper extremity supported, Feet supported Sitting balance-Leahy Scale: Fair     Standing balance support: Bilateral upper extremity supported, During functional activity, Reliant on assistive device for balance Standing balance-Leahy Scale: Fair Standing balance comment: reliant on UE         ADL either performed or assessed with clinical judgement   ADL Overall ADL's : Needs assistance/impaired Eating/Feeding: Modified independent;Sitting Eating/Feeding Details (indicate cue type and reason): particular about drinking water temperature Grooming: Sitting;Set up;Wash/dry face   Upper Body Bathing: Set up;Contact guard assist;Sitting   Lower Body Bathing: Total assistance;Maximal assistance;Sitting/lateral leans;Sit to/from stand   Upper Body Dressing : Contact guard assist;Set up;Sitting   Lower Body Dressing: Maximal assistance;Total assistance;Sitting/lateral leans;Sit to/from stand   Toilet Transfer: Moderate assistance;+2 for physical assistance;+2 for safety/equipment;Ambulation;Rolling walker (2 wheels) Toilet Transfer Details (indicate cue type and reason): with increased time. Toileting- Clothing Manipulation and Hygiene: Total assistance;Sit to/from stand               Vision Baseline Vision/History: 1 Wears glasses Vision Assessment?: No apparent visual deficits            Pertinent Vitals/Pain Pain Assessment Pain Assessment: No/denies pain     Extremity/Trunk Assessment Upper Extremity Assessment Upper Extremity Assessment: LUE deficits/detail LUE Deficits / Details: limietd ROM FF about 70 degrees reported they are 'always' like this   Lower Extremity  Assessment Lower Extremity Assessment: Defer to PT evaluation   Cervical /  Trunk Assessment Cervical / Trunk Assessment: Kyphotic   Communication Communication Communication: Difficulty following commands/understanding Following commands: Follows one step commands with increased time Cueing Techniques: Verbal cues;Gestural cues   Cognition Arousal: Alert Behavior During Therapy: Anxious Overall Cognitive Status: No family/caregiver present to determine baseline cognitive functioning Area of Impairment: Orientation, Memory, Safety/judgement, Awareness                 Orientation Level: Place, Time, Situation, Disoriented to   Memory: Decreased recall of precautions, Decreased short-term memory   Safety/Judgement: Decreased awareness of safety, Decreased awareness of deficits     General Comments: states she has not eaten lunch( did get a try and declined it per nsg.) no memeory of recent  time. reporting year was 80 does follow directions                Home Living Family/patient expects to be discharged to:: Private residence Living Arrangements: Children Available Help at Discharge: Family;Available 24 hours/day Type of Home: House Home Access: Level entry     Home Layout: One level     Bathroom Shower/Tub: Sponge bathes at baseline         Home Equipment: Agricultural consultant (2 wheels);BSC/3in1          Prior Functioning/Environment Prior Level of Function : Needs assist             Mobility Comments: pt states uses RW ADLs Comments: per prior admission info, pt min assist for ADLs, sponge bathes at baseline w help from daughter. pt poor historian so unable to provide accurate info        OT Problem List: Decreased activity tolerance;Decreased coordination;Decreased safety awareness;Decreased knowledge of precautions;Decreased knowledge of use of DME or AE      OT Treatment/Interventions: Self-care/ADL training;Energy conservation;Therapeutic  exercise;DME and/or AE instruction;Patient/family education;Therapeutic activities;Balance training    OT Goals(Current goals can be found in the care plan section) Acute Rehab OT Goals Patient Stated Goal: to go home OT Goal Formulation: Patient unable to participate in goal setting Time For Goal Achievement: 05/26/23 Potential to Achieve Goals: Fair  OT Frequency: Min 1X/week       AM-PAC OT "6 Clicks" Daily Activity     Outcome Measure Help from another person eating meals?: A Little Help from another person taking care of personal grooming?: A Little Help from another person toileting, which includes using toliet, bedpan, or urinal?: A Lot Help from another person bathing (including washing, rinsing, drying)?: A Lot Help from another person to put on and taking off regular upper body clothing?: A Little Help from another person to put on and taking off regular lower body clothing?: A Lot 6 Click Score: 15   End of Session Equipment Utilized During Treatment: Gait belt;Rolling walker (2 wheels)  Activity Tolerance: Patient tolerated treatment well Patient left: in chair;with call bell/phone within reach;with chair alarm set  OT Visit Diagnosis: Unsteadiness on feet (R26.81);Muscle weakness (generalized) (M62.81);Other symptoms and signs involving cognitive function                Time: 5409-8119 OT Time Calculation (min): 28 min Charges:  OT General Charges $OT Visit: 1 Visit OT Evaluation $OT Eval Low Complexity: 1 Low  Ricahrd Schwager OTR/L, MS Acute Rehabilitation Department Office# [phone]   Selinda Flavin 05/12/2023, 12:24 PM

## 2023-05-12 NOTE — Progress Notes (Signed)
   05/12/23 1640  TOC Discharge Assessment  Final next level of care Home w Home Health Services  Once discharged, how will the patient get to their discharge location? Family/Friend - Partnered Transport  Has discharge transport plan been identified? Yes  Barriers to Discharge No Barriers Identified  Patient states their goals for this hospitalization and ongoing recovery are: none  CMS Medicare.gov Compare Post Acute Care list provided to: Patient (offered to daughter and pt)  Choice offered to / list presented to  Patient;Adult Children  HH Arranged PT;OT  HH Agency Other - See comment (P active with [facility] and wants to continue)  Date [facility] Agency Contacted 05/12/23  Patient and family notified of of transfer 05/12/23

## 2023-05-12 NOTE — Plan of Care (Signed)
  Problem: Education: Goal: Knowledge of General Education information will improve Description: Including pain rating scale, medication(s)/side effects and non-pharmacologic comfort measures Outcome: Adequate for Discharge   Problem: Health Behavior/Discharge Planning: Goal: Ability to manage health-related needs will improve Outcome: Adequate for Discharge   Problem: Clinical Measurements: Goal: Ability to maintain clinical measurements within normal limits will improve Outcome: Adequate for Discharge Goal: Will remain free from infection Outcome: Adequate for Discharge Goal: Diagnostic test results will improve Outcome: Adequate for Discharge Goal: Respiratory complications will improve Outcome: Adequate for Discharge Goal: Cardiovascular complication will be avoided Outcome: Adequate for Discharge   Problem: Activity: Goal: Risk for activity intolerance will decrease Outcome: Adequate for Discharge   Problem: Nutrition: Goal: Adequate nutrition will be maintained Outcome: Adequate for Discharge   Problem: Coping: Goal: Level of anxiety will decrease Outcome: Adequate for Discharge   Problem: Elimination: Goal: Will not experience complications related to bowel motility Outcome: Adequate for Discharge Goal: Will not experience complications related to urinary retention Outcome: Adequate for Discharge   Problem: Pain Managment: Goal: General experience of comfort will improve Outcome: Adequate for Discharge   Problem: Safety: Goal: Ability to remain free from injury will improve Outcome: Adequate for Discharge   Problem: Skin Integrity: Goal: Risk for impaired skin integrity will decrease Outcome: Adequate for Discharge   Problem: Acute Rehab PT Goals(only PT should resolve) Goal: Pt Will Go Supine/Side To Sit Outcome: Adequate for Discharge Goal: Pt Will Go Sit To Supine/Side Outcome: Adequate for Discharge Goal: Patient Will Transfer Sit To/From  Stand Outcome: Adequate for Discharge Goal: Pt Will Ambulate Outcome: Adequate for Discharge Goal: Pt/caregiver will Perform Home Exercise Program Outcome: Adequate for Discharge

## 2023-05-12 NOTE — Progress Notes (Signed)
Physical Therapy Treatment Patient Details Name: [name] MRN: [id number] DOB: May 01, 1928 Today's Date: 05/12/2023   History of Present Illness Ms. Gahm is a 89 yo female with PMH afib, PAD, HTN, CHF, AS who presented 05/05/23 with worsening redness and pain in her left leg.   On admission, there was concern for left lower extremity cellulitis, dyuria.    PT Comments  Patient continues  with issue of urinary incontinence every time she stands up. Able to don a urinary incontinence  brief which allowed  patient to ambulate a short distance in room with RW, min assistance. Patient  with L foot drop/steppage gait.  No family present.     If plan is discharge home, recommend the following: A little help with walking and/or transfers;A little help with bathing/dressing/bathroom;Assistance with cooking/housework;Assist for transportation;Help with stairs or ramp for entrance   Can travel by private vehicle     No  Equipment Recommendations  None recommended by PT    Recommendations for Other Services       Precautions / Restrictions Precautions Precautions: Fall Precaution Comments: incontinence, severe of bladder Restrictions Weight Bearing Restrictions: No     Mobility  Bed Mobility   Bed Mobility: Supine to Sit Rolling: Contact guard assist         General bed mobility comments: patient able to move to bed edge and sit upright    Transfers Overall transfer level: Needs assistance Equipment used: Rolling walker (2 wheels) Transfers: Sit to/from Stand Sit to Stand: Min assist, Mod assist           General transfer comment: initiallymod to stnad, able to reach and pivot to [facility], stood from [facility] and recliner with cues to use UE's, cues for safety    Ambulation/Gait Ambulation/Gait assistance: Min assist Gait Distance (Feet): 6 Feet (then 20') Assistive device: Rolling walker (2 wheels) Gait Pattern/deviations: Decreased dorsiflexion - left, Step-to pattern,  Steppage Gait velocity: decr     General Gait Details: steppage on Left noted, did  ambulate , noted fatigue   Stairs             Wheelchair Mobility     Tilt Bed    Modified Rankin (Stroke Patients Only)       Balance Overall balance assessment: Needs assistance Sitting-balance support: Bilateral upper extremity supported, Feet supported Sitting balance-Leahy Scale: Fair     Standing balance support: Bilateral upper extremity supported, During functional activity, Reliant on assistive device for balance Standing balance-Leahy Scale: Fair Standing balance comment: reliant on UE                            Cognition Arousal: Alert Behavior During Therapy: Anxious Overall Cognitive Status: No family/caregiver present to determine baseline cognitive functioning Area of Impairment: Orientation, Memory, Safety/judgement, Awareness                 Orientation Level: Place, Time, Situation   Memory: Decreased recall of precautions, Decreased short-term memory   Safety/Judgement: Decreased awareness of safety, Decreased awareness of deficits              Exercises      General Comments        Pertinent Vitals/Pain Pain Assessment Pain Assessment: No/denies pain    Home Living                          Prior Function  PT Goals (current goals can now be found in the care plan section) Progress towards PT goals: Progressing toward goals    Frequency    Min 1X/week      PT Plan      Co-evaluation              AM-PAC PT "6 Clicks" Mobility   Outcome Measure  Help needed turning from your back to your side while in a flat bed without using bedrails?: A Little Help needed moving from lying on your back to sitting on the side of a flat bed without using bedrails?: A Little Help needed moving to and from a bed to a chair (including a wheelchair)?: A Little Help needed standing up from a chair using  your arms (e.g., wheelchair or bedside chair)?: A Little Help needed to walk in hospital room?: A Little Help needed climbing 3-5 steps with a railing? : A Lot 6 Click Score: 17    End of Session Equipment Utilized During Treatment: Gait belt Activity Tolerance: Patient tolerated treatment well Patient left: in chair;with call bell/phone within reach;with chair alarm set Nurse Communication: Mobility status PT Visit Diagnosis: Unsteadiness on feet (R26.81);Other abnormalities of gait and mobility (R26.89);Difficulty in walking, not elsewhere classified (R26.2)     Time: 4401-0272 PT Time Calculation (min) (ACUTE ONLY): 30 min  Charges:    $Gait Training: 8-22 mins PT General Charges $$ ACUTE PT VISIT: 1 Visit                     Blanchard Kelch PT Acute Rehabilitation Services Office [phone] Weekend pager-[phone]    Carmilla, Bardy 05/12/2023, 11:38 AM

## 2023-05-12 NOTE — Discharge Summary (Signed)
Physician Discharge Summary   [name]:[id number] DOB: 1928/02/20 DOA: 05/05/2023  PCP: Camie Patience, FNP  Admit date: 05/05/2023 Discharge date: 05/12/2023  Admitted From: Home Disposition:  Home Discharging physician: Megan Chamber, MD Barriers to discharge: none  Recommendations at discharge: Repeat BMP and CBC Consider resuming bumex if recurrent LE edema  Discharge Condition: stable CODE STATUS: DNR Diet recommendation:  Diet Orders (From admission, onward)     Start     Ordered   05/12/23 0000  Diet - low sodium heart healthy        05/12/23 1316   05/11/23 1500  Diet Heart Room service appropriate? Yes; Fluid consistency: Thin; Fluid restriction: 1200 mL Fluid  Diet effective now       Comments: Patient now has a 1200 cc/day fluid restriction. Patient has hyponatremia and it is very important that fluids be restricted on a day-to-day basis. PLACE SIGNS in room and on the door.  Thanks.  Question Answer Comment  Room service appropriate? Yes   Fluid consistency: Thin   Fluid restriction: 1200 mL Fluid      05/11/23 1500            Hospital Course: [name] is a 89 yo female with PMH afib, PAD, HTN, CHF, AS who presented with worsening redness and pain in her left leg.  She also follows with outpatient wound care center for bilateral wounds on her feet.  On admission, there was concern for left lower extremity cellulitis and she was started on antibiotics and admitted for further monitoring.  She also underwent further workup for other infectious etiologies.  UA was consistent with large LE, greater than 50 WBC.  Of note, she also had recent urinary testing outpatient on 04/21/2023 with urine specimens growing Pseudomonas with some resistance.  Daughter states patient was not treated and she was endorsing symptoms on admission.  Assessment and Plan: * Cellulitis of leg, left-resolved as of 05/11/2023 - consistent with cellulitis; nonpurulent and likely  moderate. Today the erythema and calor is present but improved - does have known PAD with recent LLE angiogram and angioplasty on 03/04/23 - s/p vanc/cefepime on admission - continue doxy mono for coverage - essentially is healed now; will finish total of 7 day course; completed inpatient   Hyponatremia - became more hyponatremic morning of 8/31 and more confused, raising concern for symptomatic hyponatremia - was on bumex and spirono from 8/28-8/29 but stopped this once Na dropped to 124 on 8/30 and placed her on NS @ 50; morning of 8/31 Na down to 119 after starting IVF on 8/30; fluids d/c'd and further workup commenced. Suspicion and concern is for SIADH - nephrology also consulted for assistance  - Na improving, s/p tolvaptan dose on 8/31 - bumex and spirono held at discharge - 7 day course NaCl tabs started per nephrology  - if patient develops worsening LE edema, instructed daughter she can resume daily bumex and will need outpatient labs repeated at discharge   ABLA (acute blood loss anemia)-resolved as of 05/11/2023 - Hgb drop, was down to 6.5 g/dL after admission - differential includes spontaneous bleeding 2/2 supratherapeutic INR (now improved without reversal) vs occult GIB - FOBT positive; discussed with GI. Given high risk for anesthesia, will proceed with conservative measures for now as risk outweighs benefit currently - INR therapeutic; Hgb drop noted again today, 6.7 g/dL. Received 1 unit PRBC and Hgb improved to 8.4 g/dL. Discussed with daughter.  - next plan is to  obtain CTA A/P if Hgb does drop again in efforts to locate if possible; options would be ongoing observation vs consideration of discontinuing Coumadin but her stroke risk remains high off AC; (CHA2DS2-VASc = 6 pts) - for reference, also has high ATRIA bleed score, 7 points (5.8% annual risk hemorrhage) and high HAS-BLED score.   - This will have to be a risk/benefit discussion with patient/family as we continue. She  has a fairly decent QOL and her mortality from stroke is almost as comparable to her mortality from bleeding risk. After my discussion with daughter (daily) and again today, 9/1, consensus is to continue Coumadin at this time especially since INR well controlled and therapeutic for now - currently Hgb stable 8.4 g/dL. Will plan on outpatient repeat labwork at discharge  UTI (urinary tract infection)-resolved as of 05/11/2023 - Per daughter, she has been symptomatic.  UA also consistent with probable infection given symptoms - [facility] with resistance pattern reviewed -No urine culture sent on admission; added on; followup - continue fortaz for coverage given prior Pseudo (sens reviewed); goal 5 days of treatment; completed inpatient   Permanent atrial fibrillation (HCC) - On Coumadin at home.  INR supratherapeutic on admission with some bruising noted on right arm -Pharmacy managing Coumadin, appreciate assistance  Severe aortic stenosis - not a TAVR candidate - followed by cardiology   Chronic kidney disease, stage 3b (HCC) - patient has history of CKD3b. Baseline creat ~ 1.1 - 1.3, eGFR~ 39 - currently at baseline    Hypothyroidism - Continue Synthroid  Physical deconditioning - not unsurprising but patient hoping to return to prior level of living - will ask for PT eval; daughter still preferring for taking patient home at discharge.  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 On day of discharge, patient was felt deemed stable for discharge. Patient/family member advised to call PCP or come back to ER if needed.   Principal Diagnosis: Cellulitis of leg, left  Discharge Diagnoses: Active  Hospital Problems   Diagnosis Date Noted   Hyponatremia 11/16/2021    Priority: 2.   Permanent atrial fibrillation (HCC) 06/20/2009    Priority: 3.   Severe aortic stenosis 10/26/2022    Priority: 4.   Chronic kidney disease, stage 3b (HCC) 11/16/2021    Priority: 4.   Hypothyroidism 12/25/2008    Priority: 4.   Physical deconditioning 05/08/2023    Priority: 5.   Gastroesophageal reflux disease 12/25/2008    Priority: 5.   Personal history of pulmonary embolism 11/15/2021   Chronic thromboembolic pulmonary hypertension (HCC) 05/12/2014    Resolved Hospital Problems   Diagnosis Date Noted Date Resolved   Cellulitis of leg, left 05/06/2023 05/11/2023    Priority: 1.   ABLA (acute blood loss anemia) 05/08/2023 05/11/2023    Priority: 2.   UTI (urinary tract infection) 01/16/2023 05/11/2023    Priority: 2.     Discharge Instructions     Diet - low sodium heart healthy   Complete by: As directed    Discharge wound care:   Complete by: As directed    Resume outpatient wound care   Increase activity slowly   Complete by: As directed       Allergies as of 05/12/2023  Reactions   Amoxicillin Other (See Comments)   Unknown reaction  Tolerates Keflex, Cefepime   Azithromycin Other (See Comments)   Unknown reaction    Codeine Other (See Comments)   Unknown reaction    Erythromycin Other (See Comments)   Unknown reaction    Green Dyes Other (See Comments)   Allergic to ALL dyes   Iodine Other (See Comments)   Unknown reaction    Misc. Sulfonamide Containing Compounds    Oxycodone Other (See Comments)   Unknown reaction    Oxycodone-acetaminophen Other (See Comments)   Unknown reaction    Penicillins Other (See Comments)   Unknown reaction  Tolerates Keflex, Cefepime   Sulfa Antibiotics    Per Pt's daughter - unknown reaction    Sulfasalazine    Unsure of allergy    Tramadol Hives        Medication List     STOP taking these medications     bumetanide 0.5 MG tablet Commonly known as: BUMEX   cholecalciferol 25 MCG (1000 UNIT) tablet Commonly known as: VITAMIN D3   doxycycline 100 MG capsule Commonly known as: VIBRAMYCIN   fluconazole 150 MG tablet Commonly known as: DIFLUCAN   levofloxacin 750 MG tablet Commonly known as: LEVAQUIN   potassium chloride 10 MEQ tablet Commonly known as: KLOR-CON   spironolactone 25 MG tablet Commonly known as: ALDACTONE       TAKE these medications    acetaminophen 325 MG tablet Commonly known as: TYLENOL Take 2 tablets (650 mg total) by mouth every 6 (six) hours as needed for mild pain or headache. What changed: reasons to take this   ascorbic acid 500 MG tablet Commonly known as: VITAMIN C Take 500 mg by mouth daily.   aspirin EC 81 MG tablet Take 1 tablet (81 mg total) by mouth daily. Swallow whole.   atorvastatin 10 MG tablet Commonly known as: Lipitor Take 1 tablet (10 mg total) by mouth daily.   carboxymethylcellulose 0.5 % Soln Commonly known as: REFRESH PLUS Place 1 drop into both eyes 2 (two) times daily.   cyanocobalamin 1000 MCG tablet Commonly known as: VITAMIN B12 Take 1,000 mcg by mouth daily.   diclofenac Sodium 1 % Gel Commonly known as: VOLTAREN Apply 4 g topically 4 (four) times daily. To bilateral feet and knees What changed:  when to take this reasons to take this   estradiol 0.1 MG/GM vaginal cream Commonly known as: ESTRACE Place 1 Applicatorful vaginally daily.   gabapentin 300 MG capsule Commonly known as: NEURONTIN Take 300 mg by mouth 2 (two) times daily.   levothyroxine 100 MCG tablet Commonly known as: SYNTHROID Take 100 mcg by mouth daily before breakfast.   ondansetron 4 MG tablet Commonly known as: ZOFRAN Take 4 mg by mouth daily as needed.   OVER THE COUNTER MEDICATION Take 1 capsule by mouth at bedtime. Equate Neuriva   pantoprazole 40 MG tablet Commonly known as: PROTONIX Take 40 mg by mouth daily.    PreserVision/Lutein Caps Take 1 capsule by mouth 2 (two) times daily.   senna-docusate 8.6-50 MG tablet Commonly known as: Senokot-S Take 1 tablet by mouth 2 (two) times daily between meals as needed for mild constipation. What changed: when to take this   sodium chloride 1 g tablet Take 1 tablet (1 g total) by mouth 3 (three) times daily with meals for 6 days.   warfarin 1 MG tablet Commonly known as: COUMADIN Take 1-2 mg by mouth as directed. 1 MG (  Mon,Wed,Fri) & 2 MG on (Tues,Thurs,Sat,Sun)   zinc gluconate 50 MG tablet Take 50 mg by mouth daily.               Discharge Care Instructions  (From admission, onward)           Start     Ordered   05/12/23 0000  Discharge wound care:       Comments: Resume outpatient wound care   05/12/23 1316            Allergies  Allergen Reactions   Amoxicillin Other (See Comments)    Unknown reaction  Tolerates Keflex, Cefepime   Azithromycin Other (See Comments)    Unknown reaction    Codeine Other (See Comments)    Unknown reaction    Erythromycin Other (See Comments)    Unknown reaction    Green Dyes Other (See Comments)    Allergic to ALL dyes   Iodine Other (See Comments)    Unknown reaction    Misc. Sulfonamide Containing Compounds    Oxycodone Other (See Comments)    Unknown reaction      Oxycodone-Acetaminophen Other (See Comments)    Unknown reaction    Penicillins Other (See Comments)    Unknown reaction  Tolerates Keflex, Cefepime   Sulfa Antibiotics     Per Pt's daughter - unknown reaction    Sulfasalazine     Unsure of allergy    Tramadol Hives    Consultations: Nephrology   Procedures:   Discharge Exam: BP (!) 142/74 (BP Location: Left Arm)   Pulse 65   Temp 98.1 F (36.7 C) (Oral)   Resp 16   Ht 5\' 3"  (1.6 m)   Wt 70.7 kg   SpO2 98%   BMI 27.61 kg/m  Physical Exam Constitutional:      Appearance: Normal appearance.  HENT:     Head: Normocephalic and atraumatic.      Mouth/Throat:     Mouth: Mucous membranes are moist.  Eyes:     Extraocular Movements: Extraocular movements intact.  Cardiovascular:     Rate and Rhythm: Normal rate and regular rhythm.  Pulmonary:     Effort: Pulmonary effort is normal. No respiratory distress.     Breath sounds: Normal breath sounds. No wheezing.  Abdominal:     General: Bowel sounds are normal. There is no distension.     Palpations: Abdomen is soft.     Tenderness: There is no abdominal tenderness.  Musculoskeletal:     Cervical back: Normal range of motion and neck supple.     Comments: Large bruise circumferential on right arm above elbow (improving); LLE erythema now fully resolved (very mild calor)  Skin:    General: Skin is warm and dry.  Neurological:     General: No focal deficit present.     Mental Status: She is alert.     Comments: Oriented to name and hospital   Psychiatric:        Mood and Affect: Mood normal.      The results of significant diagnostics from this hospitalization (including imaging, microbiology, ancillary and laboratory) are listed below for reference.   Microbiology: Recent Results (from the past 240 hour(s))  Blood culture (routine x 2)     Status: None   Collection Time: 05/05/23 10:55 PM   Specimen: BLOOD  Result Value Ref Range Status   Specimen Description   Final    BLOOD LEFT ANTECUBITAL Performed at [facility],  [address] [place], [place] [address]    Special Requests   Final    BOTTLES DRAWN AEROBIC AND ANAEROBIC Blood Culture results may not be optimal due to an excessive volume of blood received in culture bottles Performed at [facility], [address] [place], [place] [address]    Culture   Final    NO GROWTH 5 DAYS Performed at [facility], [address], [place], [place] [address]    Report Status 05/11/2023 FINAL  Final  Blood culture (routine x 2)     Status: None   Collection Time: 05/05/23  11:59 PM   Specimen: BLOOD LEFT ARM  Result Value Ref Range Status   Specimen Description   Final    BLOOD LEFT ARM Performed at [facility], [address], [place], [place] [address]    Special Requests   Final    BOTTLES DRAWN AEROBIC AND ANAEROBIC Blood Culture adequate volume Performed at [facility], [address], [place], [place] [address]    Culture   Final    NO GROWTH 5 DAYS Performed at [facility], [address] [place], [place] [address]    Report Status 05/11/2023 FINAL  Final  Urine Culture (for pregnant, neutropenic or urologic patients or patients with an indwelling urinary catheter)     Status: None   Collection Time: 05/06/23  8:32 PM   Specimen: Urine, Clean Catch  Result Value Ref Range Status   Specimen Description   Final    URINE, CLEAN CATCH Performed at [facility], [address], [place], [place] [address]    Special Requests   Final    NONE Performed at [facility], [address], [place], [place] [address]    Culture   Final    NO GROWTH Performed at [facility], [address] [place], [place] [address]    Report Status 05/08/2023 FINAL  Final     Labs: BNP (last 3 results) Recent Labs    10/23/22 2152 11/01/22 0720  BNP 373.1* 329.2*   Basic Metabolic Panel: Recent Labs  Lab 05/08/23 0323 05/09/23 0409 05/09/23 1104 05/09/23 1601 05/09/23 2217 05/10/23 0408 05/10/23 0758 05/10/23 1204 05/11/23 0338 05/11/23 0711  NA 124* 119*   < > 122* 122* 125* 127* 128*  --  130*  K 3.6 3.4*   < > 4.0 3.6 3.8  --  3.8  --  3.4*  CL 89* 86*   < > 90* 90* 92*  --  95*  --  96*  CO2 26 26   < > 25 26 24   --  25  --  28  GLUCOSE 102* 94   < > 110* 111* 89  --  112*  --  101*  BUN 29* 20   < > 18 18 17   --  18  --  17  CREATININE 0.97 1.01*   < > 1.02* 1.13* 1.12*  --  1.02*  --  1.07*  CALCIUM 8.2* 8.6*   < > 8.6* 8.5* 8.8*  --  9.0  --  9.4  MG 1.8 1.9   --   --   --  2.0  --   --  2.3  --   PHOS  --   --   --   --   --   --   --   --   --  2.4*   < > =  values in this interval not displayed.   Liver Function Tests: Recent Labs  Lab 05/05/23 2255 05/09/23 1601 05/11/23 0711  AST 29 28  --   ALT 15 17  --   ALKPHOS 122 104  --   BILITOT 0.8 0.4  --   PROT 6.0* 5.2*  --   ALBUMIN 2.8* 2.3* 2.5*   No results for input(s): "LIPASE", "AMYLASE" in the last 168 hours. No results for input(s): "AMMONIA" in the last 168 hours. CBC: Recent Labs  Lab 05/05/23 2255 05/06/23 0740 05/07/23 0325 05/07/23 1738 05/08/23 0323 05/09/23 0409 05/10/23 0408 05/10/23 1204 05/11/23 0711  WBC 8.1   < > 6.4  --  10.5 6.9 4.9  --  7.1  NEUTROABS 5.4  --   --   --  8.1* 4.5 3.0  --  4.9  HGB 8.2*   < > 6.5*   < > 7.2* 7.2* 6.7* 8.4* 8.4*  HCT 25.3*   < > 20.3*   < > 21.8* 21.9* 21.0* 25.6* 26.4*  MCV 95.5   < > 95.8  --  94.8 94.4 95.0  --  95.7  PLT 205   < > 170  --  184 161 166  --  201   < > = values in this interval not displayed.   Cardiac Enzymes: No results for input(s): "CKTOTAL", "CKMB", "CKMBINDEX", "TROPONINI" in the last 168 hours. BNP: Invalid input(s): "POCBNP" CBG: No results for input(s): "GLUCAP" in the last 168 hours. D-Dimer No results for input(s): "DDIMER" in the last 72 hours. Hgb A1c No results for input(s): "HGBA1C" in the last 72 hours. Lipid Profile No results for input(s): "CHOL", "HDL", "LDLCALC", "TRIG", "CHOLHDL", "LDLDIRECT" in the last 72 hours. Thyroid function studies No results for input(s): "TSH", "T4TOTAL", "T3FREE", "THYROIDAB" in the last 72 hours.  Invalid input(s): "FREET3" Anemia work up No results for input(s): "VITAMINB12", "FOLATE", "FERRITIN", "TIBC", "IRON", "RETICCTPCT" in the last 72 hours. Urinalysis    Component Value Date/Time   COLORURINE YELLOW 05/06/2023 0447   APPEARANCEUR CLOUDY (A) 05/06/2023 0447   LABSPEC 1.004 (L) 05/06/2023 0447   PHURINE 7.0 05/06/2023 0447   GLUCOSEU  NEGATIVE 05/06/2023 0447   HGBUR NEGATIVE 05/06/2023 0447   BILIRUBINUR NEGATIVE 05/06/2023 0447   KETONESUR NEGATIVE 05/06/2023 0447   PROTEINUR NEGATIVE 05/06/2023 0447   NITRITE NEGATIVE 05/06/2023 0447   LEUKOCYTESUR LARGE (A) 05/06/2023 0447   Sepsis Labs Recent Labs  Lab 05/08/23 0323 05/09/23 0409 05/10/23 0408 05/11/23 0711  WBC 10.5 6.9 4.9 7.1   Microbiology Recent Results (from the past 240 hour(s))  Blood culture (routine x 2)     Status: None   Collection Time: 05/05/23 10:55 PM   Specimen: BLOOD  Result Value Ref Range Status   Specimen Description   Final    BLOOD LEFT ANTECUBITAL Performed at [facility], [address], [place], [place] [address]    Special Requests   Final    BOTTLES DRAWN AEROBIC AND ANAEROBIC Blood Culture results may not be optimal due to an excessive volume of blood received in culture bottles Performed at [facility], [address], [place], [place] [address]    Culture   Final    NO GROWTH 5 DAYS Performed at [facility], [address], [place], [place] [address]    Report Status 05/11/2023 FINAL  Final  Blood culture (routine x 2)     Status: None   Collection Time: 05/05/23 11:59  PM   Specimen: BLOOD LEFT ARM  Result Value Ref Range Status   Specimen Description   Final    BLOOD LEFT ARM Performed at [facility], [address] [place], [place] [address]    Special Requests   Final    BOTTLES DRAWN AEROBIC AND ANAEROBIC Blood Culture adequate volume Performed at [facility], [address], [place], [place] [address]    Culture   Final    NO GROWTH 5 DAYS Performed at [facility], [address], [place], [place] [address]    Report Status 05/11/2023 FINAL  Final  Urine Culture (for pregnant, neutropenic or urologic patients or patients with an indwelling urinary catheter)     Status: None   Collection Time: 05/06/23  8:32 PM   Specimen:  Urine, Clean Catch  Result Value Ref Range Status   Specimen Description   Final    URINE, CLEAN CATCH Performed at [facility], [address] [place], [place] [address]    Special Requests   Final    NONE Performed at [facility], [address], [place], [place] [address]    Culture   Final    NO GROWTH Performed at [facility], [address], [place], [place] [address]    Report Status 05/08/2023 FINAL  Final    Procedures/Studies: DG Abd Portable 1V  Result Date: 05/10/2023 CLINICAL DATA:  Constipation, UTI. EXAM: PORTABLE ABDOMEN - 1 VIEW COMPARISON:  None Available. FINDINGS: The bowel gas pattern is normal. A mild-to-moderate amount of retained stool is present in the colon. No radio-opaque calculi or other acute radiographic abnormality are seen. Degenerative changes are present in the lumbar spine IMPRESSION: 1. Nonobstructive bowel-gas pattern. 2. Mild-to-moderate amount of retained stool in the colon. Electronically Signed   By: Thornell Sartorius M.D.   On: 05/10/2023 20:32   DG Tibia/Fibula Left  Result Date: 05/05/2023 CLINICAL DATA:  Left lower extremity pain and swelling, initial encounter EXAM: LEFT TIBIA AND FIBULA - 2 VIEW COMPARISON:  None Available. FINDINGS: Degenerative changes about the left knee joint are seen. Soft tissue calcifications noted. No acute fracture or dislocation is noted. IMPRESSION: Degenerative change without acute abnormality. Electronically Signed   By: Alcide Clever M.D.   On: 05/05/2023 23:52   DG Foot Complete Left  Result Date: 05/05/2023 CLINICAL DATA:  Left foot pain and swelling, initial encounter EXAM: LEFT FOOT - COMPLETE 3+ VIEW COMPARISON:  11/04/2022 FINDINGS: No acute fracture or dislocation is noted. Hammertoe deformity is noted in the first toe. No soft tissue abnormality is noted. Mild tarsal degenerative changes are seen. IMPRESSION: Degenerative change without acute abnormality.  Electronically Signed   By: Alcide Clever M.D.   On: 05/05/2023 23:52   DG Foot Complete Right  Result Date: 05/05/2023 CLINICAL DATA:  Left lower leg redness and swelling with multiple ulcers. EXAM: RIGHT FOOT COMPLETE - 3+ VIEW COMPARISON:  01/29/2022. 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 Osteopenia is noted. No acute fracture or dislocation is seen. Hallux valgus deformity and degenerative changes are present in the first metatarsophalangeal joint. Degenerative changes are present in the midfoot. There is a large calcaneal spur. No periosteal elevation or bony erosions are seen. Vascular calcifications are noted in the soft tissues. IMPRESSION: 1. No acute osseous abnormality. 2. Amputation of the second digit. 3. Scattered degenerative changes. Electronically Signed   By: Thornell Sartorius M.D.   On:  05/05/2023 23:51   DG Chest Portable 1 View  Result Date: 05/05/2023 CLINICAL DATA:  Sepsis, short of breath, atrial fibrillation EXAM: PORTABLE CHEST 1 VIEW COMPARISON:  04/02/2023 FINDINGS: Single frontal view of the chest demonstrates stable enlarged cardiac silhouette. There is chronic central vascular congestion without acute airspace disease. Trace right pleural effusion unchanged. No pneumothorax. No acute bony abnormalities. IMPRESSION: 1. Stable enlarged cardiac silhouette. 2. Stable central vascular congestion without overt edema. 3. Stable trace right pleural effusion. Electronically Signed   By: Sharlet Salina M.D.   On: 05/05/2023 23:50     Time coordinating discharge: Over 30 minutes    Megan Chamber, MD  [facility] 05/12/2023, 3:07 PM

## 2023-05-13 NOTE — Progress Notes (Signed)
Office Note     CC:  follow up Requesting Provider:  Camie Patience, FNP  HPI: [name] is a 89 y.o. (10-25-1927) female who presents for follow up s/p 03/04/23 left lower extremity angiogram with shockwave lithotripsy of the SFA popliteal, peroneal arteries.  LLE with balloon angioplasty and shockwave ultrasound assisted balloon angioplasty of her left SFA, popliteal and peroneal artery by Dr. Karin Lieu for critical limb ischemia with tissue loss. She was noted to have single vessel peroneal artery outflow on the left. On the right she was noted to have diseased popliteal artery with occlusion of the TPT.  No appreciable tibial runoff but suspect due to contrast timing.   On exam today, had arrived in a wheelchair, accompanied by her daughter.  She continues to struggle with wounds on bilateral lower extremities, and most recently struggling with significant weight gain due to electrolyte issues.  She requires 2 L nasal cannula and supplemental oxygen.  She was last seen by Dr. Lady Gary earlier today.  Per her daughter, the wounds appear to be healing slowly.  There is one that appears stagnant.  Ambulation is minimal.  Denies rest pain.  Past Medical History:  Diagnosis Date   Anticoagulant long-term use    Arrhythmia    Arthritis    Atrial fibrillation (HCC)    Congestive heart failure (CHF) (HCC)    Dysphagia    Esophageal reflux    Essential hypertension    Hiatal hernia    Pulmonary embolus (HCC)    Thyroid disease     Past Surgical History:  Procedure Laterality Date   ABDOMINAL AORTOGRAM W/LOWER EXTREMITY N/A 03/04/2023   Procedure: ABDOMINAL AORTOGRAM W/LOWER EXTREMITY;  Surgeon: [name], MD;  Location: [facility] INVASIVE CV LAB;  Service: Cardiovascular;  Laterality: N/A;   AMPUTATION TOE Right 11/17/2021   Procedure: RIGHT SECOND TOE AMPUTATION;  Surgeon: [name], MD;  Location: [facility];  Service: Orthopedics;  Laterality: Right;   COLONOSCOPY     ELBOW SURGERY      LAPAROSCOPIC HYSTERECTOMY      Social History   Socioeconomic History   Marital status: Widowed    Spouse name: Not on file   Number of children: Not on file   Years of education: Not on file   Highest education level: Not on file  Occupational History   Not on file  Tobacco Use   Smoking status: Former    Current packs/day: 0.00    Types: Cigarettes    Quit date: 10/02/2001    Years since quitting: 21.6   Smokeless tobacco: Never   Tobacco comments:    former smoker  Vaping Use   Vaping status: Never Used  Substance and Sexual Activity   Alcohol use: Never   Drug use: Never   Sexual activity: Not Currently  Other Topics Concern   Not on file  Social History Narrative   Not on file   Social Determinants of Health   Financial Resource Strain: Not on file  Food Insecurity: No Food Insecurity (05/06/2023)   Hunger Vital Sign    Worried About Running Out of Food in the Last Year: Never true    Ran Out of Food in the Last Year: Never true  Transportation Needs: No Transportation Needs (05/06/2023)   PRAPARE - Administrator, Civil Service (Medical): No    Lack of Transportation (Non-Medical): No  Physical Activity: Not on file  Stress: Not on file  Social Connections: Not on  file  Intimate Partner Violence: Not At Risk (05/06/2023)   Humiliation, Afraid, Rape, and Kick questionnaire    Fear of Current or Ex-Partner: No    Emotionally Abused: No    Physically Abused: No    Sexually Abused: No    Family History  Problem Relation Age of Onset   Stroke Mother    Cancer Brother     Current Outpatient Medications  Medication Sig Dispense Refill   acetaminophen (TYLENOL) 325 MG tablet Take 2 tablets (650 mg total) by mouth every 6 (six) hours as needed for mild pain or headache. (Patient taking differently: Take 650 mg by mouth every 6 (six) hours as needed for mild pain, headache or fever.)     ascorbic acid (VITAMIN C) 500 MG tablet Take 500 mg by mouth  daily.     aspirin EC 81 MG tablet Take 1 tablet (81 mg total) by mouth daily. Swallow whole. 150 tablet 2   atorvastatin (LIPITOR) 10 MG tablet Take 1 tablet (10 mg total) by mouth daily. 30 tablet 11   carboxymethylcellulose (REFRESH PLUS) 0.5 % SOLN Place 1 drop into both eyes 2 (two) times daily.     diclofenac Sodium (VOLTAREN) 1 % GEL Apply 4 g topically 4 (four) times daily. To bilateral feet and knees (Patient taking differently: Apply 4 g topically 4 (four) times daily as needed (pain). To bilateral feet and knees)     estradiol (ESTRACE) 0.1 MG/GM vaginal cream Place 1 Applicatorful vaginally daily.     gabapentin (NEURONTIN) 300 MG capsule Take 300 mg by mouth 2 (two) times daily.      levothyroxine (SYNTHROID) 100 MCG tablet Take 100 mcg by mouth daily before breakfast.     Multiple Vitamins-Minerals (PRESERVISION/LUTEIN) CAPS Take 1 capsule by mouth 2 (two) times daily.      ondansetron (ZOFRAN) 4 MG tablet Take 4 mg by mouth daily as needed.     OVER THE COUNTER MEDICATION Take 1 capsule by mouth at bedtime. Equate Neuriva     pantoprazole (PROTONIX) 40 MG tablet Take 40 mg by mouth daily.     senna-docusate (SENOKOT-S) 8.6-50 MG tablet Take 1 tablet by mouth 2 (two) times daily between meals as needed for mild constipation. (Patient taking differently: Take 1 tablet by mouth at bedtime.) 60 tablet 0   sodium chloride 1 g tablet Take 1 tablet (1 g total) by mouth 3 (three) times daily with meals for 6 days. 18 tablet 0   vitamin B-12 (CYANOCOBALAMIN) 1000 MCG tablet Take 1,000 mcg by mouth daily. (Patient not taking: Reported on 05/06/2023)     warfarin (COUMADIN) 1 MG tablet Take 1-2 mg by mouth as directed. 1 MG (Mon,Wed,Fri) & 2 MG on (Tues,Thurs,Sat,Sun)     zinc gluconate 50 MG tablet Take 50 mg by mouth daily.     No current facility-administered medications for this visit.    Allergies  Allergen Reactions   Amoxicillin Other (See Comments)    Unknown reaction  Tolerates  Keflex, Cefepime   Azithromycin Other (See Comments)    Unknown reaction    Codeine Other (See Comments)    Unknown reaction    Erythromycin Other (See Comments)    Unknown reaction    Green Dyes Other (See Comments)    Allergic to ALL dyes   Iodine Other (See Comments)    Unknown reaction    Misc. Sulfonamide Containing Compounds    Oxycodone Other (See Comments)    Unknown reaction  Oxycodone-Acetaminophen Other (See Comments)    Unknown reaction    Penicillins Other (See Comments)    Unknown reaction  Tolerates Keflex, Cefepime   Sulfa Antibiotics     Per Pt's daughter - unknown reaction    Sulfasalazine     Unsure of allergy    Tramadol Hives     REVIEW OF SYSTEMS:  [X]  denotes positive finding, [ ]  denotes negative finding Cardiac  Comments:  Chest pain or chest pressure:    Shortness of breath upon exertion:    Short of breath when lying flat:    Irregular heart rhythm:        Vascular    Pain in calf, thigh, or hip brought on by ambulation:    Pain in feet at night that wakes you up from your sleep:     Blood clot in your veins:    Leg swelling:         Pulmonary    Oxygen at home:    Productive cough:     Wheezing:         Neurologic    Sudden weakness in arms or legs:     Sudden numbness in arms or legs:     Sudden onset of difficulty speaking or slurred speech:    Temporary loss of vision in one eye:     Problems with dizziness:         Gastrointestinal    Blood in stool:     Vomited blood:         Genitourinary    Burning when urinating:     Blood in urine:        Psychiatric    Major depression:         Hematologic    Bleeding problems:    Problems with blood clotting too easily:        Skin    Rashes or ulcers:        Constitutional    Fever or chills:      PHYSICAL EXAMINATION:  There were no vitals filed for this visit.   General:  WDWN in NAD; vital signs documented above Gait: Not observed, in wheel chair HENT:  WNL, normocephalic Pulmonary: normal non-labored breathing without  wheezing Cardiac: regular HR Abdomen: soft Vascular Exam/Pulses: 2+ femoral pulses Extremities: without ischemic changes, without Gangrene , without cellulitis; with open wound of bilateral toes. Toes are dressed. I did not remove these as they had collagen and medicated dressings in place Musculoskeletal: no muscle wasting or atrophy  Neurologic: A&O X 3 Psychiatric:  The pt has Normal affect.   Non-Invasive Vascular Imaging:   +-------+----------------+-----------+------------+------------+  ABI/TBIToday's ABI     Today's TBIPrevious ABIPrevious TBI  +-------+----------------+-----------+------------+------------+  Right 1.13 (calcified)bandages   0.48        0.39          +-------+----------------+-----------+------------+------------+  Left  0.90 (calcified)bandages   0.46        0.21          +-------+----------------+-----------+------------+------------+    ASSESSMENT/PLAN:: 89 y.o. female here for follow up for peripheral artery disease with bilateral foot wounds. On 03/04/23 she underwent Aortogram, Arteriogram of LLE with balloon angioplasty and shockwave ultrasound assisted balloon angioplasty of her left SFA, popliteal and peroneal artery by Dr. Karin Lieu.    Since that time, [name] has continued to struggle with lower extremity wounds which wax and wane.  She was recently hospitalized with electrolyte abnormalities.  Now out of the hospital, she  has struggled with fluid overload.  On exam, she arrived in a wheelchair, with 2 L nasal cannula, severely edematous.  I had a long conversation with both her and her daughter regarding her overall clinical status, and prognosis.  In short, I do not think [name] will live much longer.    Regarding the lower extremities, the wounds appear to be healing slowly, therefore I think the best course of action is continued medical management.  Should they worsen I  would offer repeat angiogram, however prefaced it with the fact I am unsure if it will provide enough perfusion for wound healing, especially as she has single-vessel outflow to the foot with microvascular disease.  After reviewing her previous angiogram, I think further intervention may require stenting through the popliteal artery which has a low patency rate.  She is aware that if any of the stents occluded, it would place [name] and a limb loss situation which would be an amputation above the knee.    I think [name] would be best served with hospice and continued conservative treatment of her lower extremity wounds. Should the wounds worsen, and her daughter decided to move forward with all interventions, I would offer repeat angiogram.  [name] asked for a 98-month follow-up, and stated she would call should they choose to pursue intervention or hospice care. Of note, [name] has been struggling with memory, and was unable to participate meaningfully in the above discussion.    [name] MD [facility] Total time of patient care including pre-visit research, consultation, and documentation greater than 45 minutes

## 2023-05-15 ENCOUNTER — Ambulatory Visit (INDEPENDENT_AMBULATORY_CARE_PROVIDER_SITE_OTHER): Payer: Medicare HMO | Admitting: Vascular Surgery

## 2023-05-15 ENCOUNTER — Encounter (HOSPITAL_BASED_OUTPATIENT_CLINIC_OR_DEPARTMENT_OTHER): Payer: Medicare HMO | Attending: General Surgery | Admitting: General Surgery

## 2023-05-15 ENCOUNTER — Encounter: Payer: Self-pay | Admitting: Vascular Surgery

## 2023-05-15 VITALS — BP 132/78 | HR 90 | Temp 98.0°F | Resp 20

## 2023-05-15 DIAGNOSIS — I872 Venous insufficiency (chronic) (peripheral): Secondary | ICD-10-CM | POA: Diagnosis not present

## 2023-05-15 DIAGNOSIS — Z7901 Long term (current) use of anticoagulants: Secondary | ICD-10-CM | POA: Insufficient documentation

## 2023-05-15 DIAGNOSIS — I130 Hypertensive heart and chronic kidney disease with heart failure and stage 1 through stage 4 chronic kidney disease, or unspecified chronic kidney disease: Secondary | ICD-10-CM | POA: Insufficient documentation

## 2023-05-15 DIAGNOSIS — I70223 Atherosclerosis of native arteries of extremities with rest pain, bilateral legs: Secondary | ICD-10-CM | POA: Diagnosis not present

## 2023-05-15 DIAGNOSIS — N1832 Chronic kidney disease, stage 3b: Secondary | ICD-10-CM | POA: Insufficient documentation

## 2023-05-15 DIAGNOSIS — L97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 without evidence of necrosis: Secondary | ICD-10-CM | POA: Diagnosis present

## 2023-05-15 DIAGNOSIS — I5032 Chronic diastolic (congestive) heart failure: Secondary | ICD-10-CM | POA: Insufficient documentation

## 2023-05-15 DIAGNOSIS — L89623 Pressure ulcer of left heel, stage 3: Secondary | ICD-10-CM | POA: Diagnosis not present

## 2023-05-19 ENCOUNTER — Ambulatory Visit (INDEPENDENT_AMBULATORY_CARE_PROVIDER_SITE_OTHER): Payer: Medicare HMO | Admitting: Ophthalmology

## 2023-05-19 ENCOUNTER — Encounter (INDEPENDENT_AMBULATORY_CARE_PROVIDER_SITE_OTHER): Payer: Self-pay | Admitting: Ophthalmology

## 2023-05-19 DIAGNOSIS — I10 Essential (primary) hypertension: Secondary | ICD-10-CM

## 2023-05-19 DIAGNOSIS — H35033 Hypertensive retinopathy, bilateral: Secondary | ICD-10-CM

## 2023-05-19 DIAGNOSIS — H353133 Nonexudative age-related macular degeneration, bilateral, advanced atrophic without subfoveal involvement: Secondary | ICD-10-CM | POA: Diagnosis not present

## 2023-05-19 DIAGNOSIS — H04123 Dry eye syndrome of bilateral lacrimal glands: Secondary | ICD-10-CM

## 2023-05-19 DIAGNOSIS — H26492 Other secondary cataract, left eye: Secondary | ICD-10-CM

## 2023-05-19 DIAGNOSIS — Z961 Presence of intraocular lens: Secondary | ICD-10-CM

## 2023-05-19 NOTE — Progress Notes (Addendum)
AGGIE, HACKMAN ([id number]) [id number] Page 1 of 18 Visit Report for 05/15/2023 Chief Complaint Document Details Patient Name: Date of Service: [name], [name]. 05/15/2023 11:45 A [name] Medical Record Number: [id number] Patient Account Number: [id number] Date of Birth/Sex: Treating RN: [birth year] (89 y.o. F) Primary Care Provider: [name] Other Clinician: Referring Provider: Treating Provider/Extender: Tiajuana Amass in Treatment: 9 Information Obtained from: Patient Chief Complaint 01/28/2022; right second toe amputation site dehiscence and bilateral lower extremity wounds. 03/10/2023: pressure ulcers of right heel, ulcers on toes Electronic Signature(s) Signed: 05/15/2023 12:35:11 PM By: Duanne Guess MD FACS Entered By: Duanne Guess on 05/15/2023 09:35:11 -------------------------------------------------------------------------------- Debridement Details Patient Name: Date of Service: Megan Reap. 05/15/2023 11:45 A [name] Medical Record Number: [id number] Patient Account Number: [id number] Date of Birth/Sex: Treating RN: 1928/05/18 (89 y.o. [name] Primary Care Provider: [name] Other Clinician: Referring Provider: Treating Provider/Extender: Claudie Leach Weeks in Treatment: 9 Debridement Performed for Assessment: Wound #13 Left,Dorsal T Great oe Performed By: Physician Duanne Guess, MD Debridement Type: Debridement Severity of Tissue Pre Debridement: Fat layer exposed Level of Consciousness (Pre-procedure): Awake and Alert Pre-procedure Verification/Time Out Yes - 12:25 Taken: Start Time: 12:25 Pain Control: Lidocaine 5% topical ointment Percent of Wound Bed Debrided: 100% T Area Debrided (cm): otal 1.49 Tissue and other material debrided: Viable, Non-Viable, Slough, Tendon, Slough Level: Skin/Subcutaneous Tissue/Muscle Debridement Description: Excisional Instrument: Curette, Forceps,  Scissors Bleeding: Minimum Hemostasis Achieved: Pressure Procedural Pain: 3 Post Procedural Pain: 2 Response to Treatment: Procedure was tolerated well Level of Consciousness (Post- Awake and Alert procedure): Post Debridement Measurements of Total Wound Length: (cm) 1 Width: (cm) 1.9 Depth: (cm) 0.1 Volume: (cm) 0.149 [name], [name] ([id number]) [id number] Page 2 of 18 Character of Wound/Ulcer Post Debridement: Improved Severity of Tissue Post Debridement: Fat layer exposed Post Procedure Diagnosis Same as Pre-procedure Notes scribed for Dr. Lady Gary by Zenaida Deed, RN Electronic Signature(s) Signed: 05/15/2023 12:47:20 PM By: Duanne Guess MD FACS Signed: 05/19/2023 3:11:25 PM By: Redmond Pulling RN, BSN Entered By: Redmond Pulling on 05/15/2023 09:29:30 -------------------------------------------------------------------------------- Debridement Details Patient Name: Date of Service: Megan Reap. 05/15/2023 11:45 A [name] Medical Record Number: [id number] Patient Account Number: [id number] Date of Birth/Sex: Treating RN: [birth year] (89 y.o. [name] Primary Care Provider: [name] Other Clinician: Referring Provider: Treating Provider/Extender: Claudie Leach Weeks in Treatment: 9 Debridement Performed for Assessment: Wound #10 Left T Second oe Performed By: Physician Duanne Guess, MD Debridement Type: Debridement Level of Consciousness (Pre-procedure): Awake and Alert Pre-procedure Verification/Time Out Yes - 12:25 Taken: Start Time: 12:25 Pain Control: Lidocaine 5% topical ointment Percent of Wound Bed Debrided: 100% T Area Debrided (cm): otal 0.21 Tissue and other material debrided: Non-Viable, Slough, Slough Level: Non-Viable Tissue Debridement Description: Selective/Open Wound Instrument: Curette, Forceps, Scissors Bleeding: Minimum Hemostasis Achieved: Pressure Procedural Pain: 3 Post Procedural  Pain: 2 Response to Treatment: Procedure was tolerated well Level of Consciousness (Post- Awake and Alert procedure): Post Debridement Measurements of Total Wound Length: (cm) 0.3 Stage: Category/Stage IV Width: (cm) 0.9 Depth: (cm) 0.1 Volume: (cm) 0.021 Character of Wound/Ulcer Post Debridement: Stable Post Procedure Diagnosis Same as Pre-procedure Notes scribed for Dr. Lady Gary by Zenaida Deed, RN Electronic Signature(s) Signed: 05/15/2023 12:47:20 PM By: Duanne Guess MD FACS Signed: 05/19/2023 3:11:25 PM By: Redmond Pulling RN, BSN Entered By: Redmond Pulling on 05/15/2023 09:30:24 [name], [name] ([id number]) [id number] Page 3 of 18 -------------------------------------------------------------------------------- Debridement Details Patient Name: Date of Service: [name],  Megan TTIE [name]. 05/15/2023 11:45 A [name] Medical Record Number: [id number] Patient Account Number: [id number] Date of Birth/Sex: Treating RN: [birth year] (89 y.o. [name] Primary Care Provider: [name] Other Clinician: Referring Provider: Treating Provider/Extender: Claudie Leach Weeks in Treatment: 9 Debridement Performed for Assessment: Wound #11 Right T Second oe Performed By: Physician Duanne Guess, MD Debridement Type: Debridement Level of Consciousness (Pre-procedure): Awake and Alert Pre-procedure Verification/Time Out Yes - 12:25 Taken: Start Time: 12:25 Pain Control: Lidocaine 5% topical ointment Percent of Wound Bed Debrided: 100% T Area Debrided (cm): otal 0.31 Tissue and other material debrided: Non-Viable, Slough, Slough Level: Non-Viable Tissue Debridement Description: Selective/Open Wound Instrument: Curette Bleeding: Minimum Hemostasis Achieved: Pressure Procedural Pain: 3 Post Procedural Pain: 2 Response to Treatment: Procedure was tolerated well Level of Consciousness (Post- Awake and Alert procedure): Post Debridement  Measurements of Total Wound Length: (cm) 0.5 Stage: Category/Stage IV Width: (cm) 0.8 Depth: (cm) 0.1 Volume: (cm) 0.031 Character of Wound/Ulcer Post Debridement: Improved Post Procedure Diagnosis Same as Pre-procedure Notes scribed for Dr. Lady Gary by Zenaida Deed, RN Electronic Signature(s) Signed: 05/15/2023 12:47:20 PM By: Duanne Guess MD FACS Signed: 05/19/2023 3:11:25 PM By: Redmond Pulling RN, BSN Entered By: Redmond Pulling on 05/15/2023 09:31:51 -------------------------------------------------------------------------------- Debridement Details Patient Name: Date of Service: Megan Reap. 05/15/2023 11:45 A [name] Medical Record Number: [id number] Patient Account Number: [id number] Date of Birth/Sex: Treating RN: 08-Dec-1927 (89 y.o. [name] Primary Care Provider: [name] Other Clinician: Referring Provider: Treating Provider/Extender: Claudie Leach Weeks in Treatment: 9 Debridement Performed for Assessment: Wound #15 Right,Dorsal T Great oe Performed By: Physician Duanne Guess, MD Debridement Type: Debridement Severity of Tissue Pre Debridement: Fat layer exposed Level of Consciousness (Pre-procedure): Awake and Alert Pre-procedure Verification/Time Out SHALIKA, DERY ([id number]) [id number] Page 4 of 18 Pre-procedure Verification/Time Out Yes - 12:25 Taken: Start Time: 12:25 Pain Control: Lidocaine 5% topical ointment Percent of Wound Bed Debrided: 100% T Area Debrided (cm): otal 0.42 Tissue and other material debrided: Non-Viable, Slough, Slough Level: Non-Viable Tissue Debridement Description: Selective/Open Wound Instrument: Curette Bleeding: Minimum Hemostasis Achieved: Pressure Procedural Pain: 3 Post Procedural Pain: 2 Response to Treatment: Procedure was tolerated well Level of Consciousness (Post- Awake and Alert procedure): Post Debridement Measurements of Total Wound Length: (cm)  0.9 Width: (cm) 0.6 Depth: (cm) 0.1 Volume: (cm) 0.042 Character of Wound/Ulcer Post Debridement: Stable Severity of Tissue Post Debridement: Fat layer exposed Post Procedure Diagnosis Same as Pre-procedure Notes scribed for Dr. Lady Gary by Zenaida Deed, RN Electronic Signature(s) Signed: 05/15/2023 12:47:20 PM By: Duanne Guess MD FACS Signed: 05/19/2023 3:11:25 PM By: Redmond Pulling RN, BSN Entered By: Redmond Pulling on 05/15/2023 09:33:20 -------------------------------------------------------------------------------- Debridement Details Patient Name: Date of Service: Megan Reap. 05/15/2023 11:45 A [name] Medical Record Number: [id number] Patient Account Number: [id number] Date of Birth/Sex: Treating RN: Jul 15, 1928 (89 y.o. [name] Primary Care Provider: [name] Other Clinician: Referring Provider: Treating Provider/Extender: Claudie Leach Weeks in Treatment: 9 Debridement Performed for Assessment: Wound #12 Left T Great oe Performed By: Physician Duanne Guess, MD Debridement Type: Debridement Severity of Tissue Pre Debridement: Fat layer exposed Level of Consciousness (Pre-procedure): Awake and Alert Pre-procedure Verification/Time Out Yes - 12:25 Taken: Start Time: 12:25 Pain Control: Lidocaine 5% topical ointment Percent of Wound Bed Debrided: 100% T Area Debrided (cm): otal 0.07 Tissue and other material debrided: Non-Viable, Callus, Slough, Slough Level: Non-Viable Tissue Debridement Description: Selective/Open Wound Instrument: Curette Bleeding: Minimum Hemostasis Achieved: Pressure Procedural  Pain: 3 Post Procedural Pain: 2 Response to Treatment: Procedure was tolerated well Level of Consciousness (Post- Awake and Alert procedure): [name], [name] ([id number]) [id number] Page 5 of 18 Post Debridement Measurements of Total Wound Length: (cm) 0.3 Width: (cm) 0.3 Depth: (cm) 0.1 Volume: (cm)  0.007 Character of Wound/Ulcer Post Debridement: Improved Severity of Tissue Post Debridement: Fat layer exposed Post Procedure Diagnosis Same as Pre-procedure Notes scribed for Dr. Lady Gary by Zenaida Deed, RN Electronic Signature(s) Signed: 05/15/2023 12:47:20 PM By: Duanne Guess MD FACS Signed: 05/19/2023 3:11:25 PM By: Redmond Pulling RN, BSN Entered By: Redmond Pulling on 05/15/2023 09:34:09 -------------------------------------------------------------------------------- Debridement Details Patient Name: Date of Service: Megan Reap. 05/15/2023 11:45 A [name] Medical Record Number: [id number] Patient Account Number: [id number] Date of Birth/Sex: Treating RN: 04/24/1928 (89 y.o. F) Primary Care Provider: [name] Other Clinician: Referring Provider: Treating Provider/Extender: Claudie Leach Weeks in Treatment: 9 Debridement Performed for Assessment: Wound #9 Left Calcaneus Performed By: Physician Duanne Guess, MD Debridement Type: Debridement Level of Consciousness (Pre-procedure): Awake and Alert Pre-procedure Verification/Time Out Yes - 12:25 Taken: Start Time: 12:25 Pain Control: Lidocaine 5% topical ointment Percent of Wound Bed Debrided: 100% T Area Debrided (cm): otal 0.19 Tissue and other material debrided: Viable, Non-Viable, Slough, Subcutaneous, Slough Level: Skin/Subcutaneous Tissue Debridement Description: Excisional Instrument: Curette Bleeding: Minimum Hemostasis Achieved: Pressure Procedural Pain: 3 Post Procedural Pain: 2 Response to Treatment: Procedure was tolerated well Level of Consciousness (Post- Awake and Alert procedure): Post Debridement Measurements of Total Wound Length: (cm) 0.6 Stage: Category/Stage III Width: (cm) 0.4 Depth: (cm) 0.3 Volume: (cm) 0.057 Character of Wound/Ulcer Post Debridement: Improved Post Procedure Diagnosis Same as Pre-procedure Notes scribed for Dr. Lady Gary by Zenaida Deed, RN Late  entry 07/28/2023 for date of procedure 05/15/2023: The documentation has been amended to accurately reflect the post-debridement stage of the wound. Note: This entry occurs well after 24 to 48 hours after the date of the original documentation. Electronic Signature(s) BRITTNEYANN, DELAPUENTE ([id number]) [id number] Page 6 of 18 Signed: 07/28/2023 2:05:05 PM By: Duanne Guess MD FACS Previous Signature: 07/28/2023 1:59:57 PM Version By: Duanne Guess MD FACS Previous Signature: 05/19/2023 9:00:25 AM Version By: Duanne Guess MD FACS Previous Signature: 05/15/2023 12:47:20 PM Version By: Duanne Guess MD FACS Entered By: Duanne Guess on 07/28/2023 11:05:05 -------------------------------------------------------------------------------- HPI Details Patient Name: Date of Service: Ethelda Chick TTIE [name]. 05/15/2023 11:45 A [name] Medical Record Number: [id number] Patient Account Number: [id number] Date of Birth/Sex: Treating RN: 1928/03/17 (89 y.o. F) Primary Care Provider: [name] Other Clinician: Referring Provider: Treating Provider/Extender: Claudie Leach Weeks in Treatment: 9 History of Present Illness HPI Description: Admission 01/28/2022 Ms. 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 She states she has tried Medihoney and silver alginate in the past to this area with little benefit. She also has 2 small areas limited to skin breakdown to her lower extremities bilaterally. She has chronic venous insufficiency but not has not been wearing her compression stockings. She states she bumped her legs against an object and not so the wound started. She has been using Medihoney to the  sites. She denies signs of infection. 6/2; patient presents for follow-up. She had an x-ray of her right foot done  at last clinic visit and this was negative for evidence of osteomyelitis. She also had a wound culture done that showed extra high levels of Staph aureus. I recommended Keystone antibiotics for this and this was ordered. She had ABIs with TBI's done as well that showed monophasic waveforms to the right foot with TBI of 0 and an ABI of 0.52. Urgent referral was made to vein and vascular and she saw Dr. Durwin Nora on 6/1, yesterday and he recommended an arteriogram. This is scheduled for 6/16. Patient also reports a new wound to the right great toe. This is a blister that has ruptured. She also reports increased erythema to the toe. 6/6; the patient was worked in urgently today at the insistence of her daughter out of concern for a new wound on the lateral part of the plantar right great toe. She has her original postsurgical wound after the amputation of the right second toe, she has a wound on the medial part of the right great toe. The patient is apparently going for an angiogram by Dr. Durwin Nora in 2 weeks time. 6/13; patient presents for follow-up. She has been using bacitracin to the abrasion on the right great toe. She has been using collagen and Keystone antibiotic to the amputation site. She has no issues or complaints today. She denies signs of infection. 6/22; patient presents for follow-up. She states that her abdominal aortogram was canceled due to her renal function. She has been using Keystone antibiotics to the amputation site and Medihoney to the right great toe wound. At the pace of the right great toe she has a slitlike open area that she thinks was caused by the tape from the dressing. 6/29; patient presents for follow-up. She has been using Keystone antibiotics to the amputation site along with collagen. She has been using Medihoney to the right great toe wound. She has no other  wounds. She denies signs of infection. 7/13; patient presents for follow-up. She has been using Keystone antibiotics and collagen to the wound sites. She currently denies signs of infection. She states she is scheduled to see me nephrology next month. 7/20; patient presents for follow-up. She continue Keystone antibiotics and collagen to the wound sites. She has no issues or complaints today. 8/1; patient presents for follow up. She continues to use keystone antibiotics and collagen to the wound sites. She has no issues or complaints today. 8/15; patient presents for follow-up. She has been using Keystone antibiotics and collagen to the wound sites. She followed up with her nephrologist who made medication changes. She is supposed to get a repeat BMP in 2 weeks. Her decrease in renal function was a limiting factor in obtaining an arteriogram for potential intervention for revascularization. She currently denies signs of infection. 9/2; patient presents for follow-up. She has been using Keystone antibiotic and collagen to the wound beds. She has no issues or complaints today. Reports there has been improvement in kidney function however not cleared to have her arteriogram just yet. 10/10; patient presents for follow-up. She has been using Keystone antibiotic and collagen to the wound beds. She reports 2 new wounds 1 to the anterior right lower extremity and another to the plantar aspect of the right foot. She states that the right plantar foot wound was caused by the home health nurse changing the dressing and causing a skin tear. She is not sure how the right anterior leg wound started. It appears to be from trauma. She denies signs of infection. 10/19; patient  presents for follow-up. She has been using Keystone antibiotic and collagen to the right great toe wound. She is been using silver alginate to the right anterior and right plantar foot wound. She has been using Tubigrip to the right lower  extremity. The plantar foot wound has healed. She has no issues or complaints today. 11/3; since the patient was last here she was seen in urgent care apparently for an area on the dorsal aspect of the right fifth toe perhaps over the PIP. I saw a picture of this on the daughter's cell phone. There was slough on this. Urgent care gave him doxycycline. She is also changed the dressing to all wounds back the [facility] and collagen which includes her right leg and left first toe 11/16; patient presents for follow-up. She has a new wound to the left knee. She states she fell. She has been using antibiotic ointment and collagen to this area. She has been using collagen and Keystone antibiotic to the right great toe wound. The anterior right leg wound is healed. She denies signs of infection. 11/30; patient presents for follow-up. The right great toe wound has healed. She has 1 remaining wound to the left knee. She has been using Hydrofera Blue and Medihoney here. 12/19; patient presents for follow-up. Her left knee wound has healed. She has no issues or complaints today. [name], [name] ([id number]) [id number] Page 7 of 18 09/30/2022 Patient's daughter called for an appointment due to increased swelling to her lower extremities bilaterally. Today patient presents with increased swelling to her lower extremities although there is no increased redness or warmth. She was evaluated by her nephrologist who increased her diuretics. Per patient and daughter her swelling has gone down to the leg Over the past several days. She does not have any open wounds. She was advised to elevate her legs and not consume excess salt By her PCP and nephrologist. Patient has compression stockings that she has been using sporadically. READMISSION 03/10/2023 Since her last visit to the clinic, the patient has been hospitalized at least twice, in February with COVID pneumonia and CHF exacerbation. She  was discharged to a skilled nursing facility and then readmitted in April with fevers. She was noted to have pressure ulcers on her heels and sacrum. Per family declined skilled nursing placement upon discharge and she has apparently been residing at home. She has followed with the vascular surgery clinic regarding peripheral artery disease and due to ulcerations on her feet, she ultimately underwent arteriography with balloon angioplasties of the superficial femoral artery, popliteal artery and peroneal artery on the left, along with shockwave ultrasound assisted balloon angioplasty of the popliteal artery and distal SFA. Given her multiple significant medical comorbidities, she is not a candidate for open surgery. She is deemed to be maximally vascularized this procedure, which was performed on 04 March 2023. T oday, she has a stage III pressure ulcer on her left heel and wounds on the PIP joints of her right third and left second toes. No sacral wound is present and the ulcer on her left heel has closed. 03/18/2023: She has a new ulcer on her left great toe. It was just noticed yesterday and the etiology is unknown. The fat layer is exposed and there is some slough accumulation. The other wounds are all essentially unchanged in size. There is a little bit more granulation tissue on the other toe wounds, but bone is still frankly exposed on both sides. The heel ulcer has thick slough accumulation.  03/26/2023: She has a new wound on the tip of her left third toe. It is black. Neither the patient nor her daughter are aware of how it began. The other wounds are stable. 03/31/2023: The culture that I took from her left third toe returned with MRSA. We contacted the patient's daughter to have her stop levofloxacin and start doxycycline, but she has not yet picked up the new antibiotic. All of her wounds have accumulated slough. Bone remains exposed. She is scheduled to follow- up with vascular surgery  tomorrow to evaluate the patency of her revascularization. 04/09/2023: She saw her vascular surgeon and her ABIs are improved; her TBI's could not be checked due to her bandages. She did finally start taking the prescribed doxycycline. All of her wounds look about the same today. 04/23/2023: No significant changes to any of her wounds aside from being slightly smaller other than the left dorsal great toe wound, which is stable. She has accumulated slough and eschar on all of the surfaces. 04/30/2023: All of the wounds are stable with the exception of the left dorsal great toe wound which now has tendon exposed in addition to bone. There is slough and eschar accumulation on all sites. 05/15/2023: The patient was in the hospital and missed her appointment last week. The wound at the tip of her left third toe is healed. Everything else looks the same. Electronic Signature(s) Signed: 05/15/2023 12:35:58 PM By: Duanne Guess MD FACS Entered By: Duanne Guess on 05/15/2023 09:35:58 -------------------------------------------------------------------------------- Physical Exam Details Patient Name: Date of Service: Megan Reap. 05/15/2023 11:45 A [name] Medical Record Number: [id number] Patient Account Number: [id number] Date of Birth/Sex: Treating RN: 1927-09-13 (89 y.o. F) Primary Care Provider: [name] Other Clinician: Referring Provider: Treating Provider/Extender: Claudie Leach Weeks in Treatment: 9 Constitutional Hypertensive, asymptomatic. . . . no acute distress, but appears fatigued. Respiratory Normal work of breathing on supplementary oxygen. Notes 05/15/2023: The wound at the tip of her left third toe is healed. Everything else looks the same. Electronic Signature(s) Signed: 05/15/2023 12:37:01 PM By: Duanne Guess MD FACS Entered By: Duanne Guess on 05/15/2023 09:37:01 [name], [name] ([id number]) [id number] Page 8 of  18 -------------------------------------------------------------------------------- Physician Orders Details Patient Name: Date of Service: [name], [name] 05/15/2023 11:45 A [name] Medical Record Number: [id number] Patient Account Number: [id number] Date of Birth/Sex: Treating RN: 1927/12/13 (89 y.o. 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 - Dr. Lady Gary - room 1 Return A Thurs 9/12 @ 2:45 pm Anesthetic (In clinic) Topical Lidocaine 4% applied to wound bed Bathing/ Shower/ Hygiene May shower and wash wound with soap and water. Edema Control - Lymphedema / SCD / Other Elevate legs to the level of the heart or above for 30 minutes daily and/or when sitting for 3-4 times a day throughout the day. Avoid standing for long periods of  time. Patient to wear own compression stockings every day. Additional Orders / Instructions Follow Nutritious Diet - add in protein shakes every day to diet - recommend premier protein 500 mg x3 a day vitamin C, zinc 30-50 mg per day Home Health No change in wound care orders this week; continue Home Health for wound care. May utilize formulary equivalent dressing for  wound treatment orders unless otherwise specified. Other Home Health Orders/Instructions: - Medi Home Wound Treatment Wound #10 - T Second oe Wound Laterality: Left Cleanser: Soap and Water 1 x Per Day/30 Days Discharge Instructions: May shower and wash wound with dial antibacterial soap and water prior to dressing change. Cleanser: Wound Cleanser 1 x Per Day/30 Days Discharge Instructions: Cleanse the wound with wound cleanser prior to applying a clean dressing using gauze sponges, not tissue or cotton balls. Prim Dressing: Promogran Prisma Matrix, 4.34 (sq in) (silver collagen) (Generic) 1 x Per Day/30 Days ary Discharge Instructions: Moisten collagen with saline or hydrogel Secondary Dressing: Woven Gauze Sponge, Non-Sterile 4x4 in (Generic) 1 x Per Day/30 Days Discharge Instructions: Apply over primary dressing as directed. Secured With: 66M Medipore H Soft Cloth Surgical T ape, 4 x 10 (in/yd) (Generic) 1 x Per Day/30 Days Discharge Instructions: Secure with tape as directed. Wound #11 - T Second oe Wound Laterality: Right Cleanser: Soap and Water 1 x Per Day/30 Days Discharge Instructions: May shower and wash wound with dial antibacterial soap and water prior to dressing change. Cleanser: Wound Cleanser 1 x Per Day/30 Days EMELY, WINNER ([id number]) [id number] Page 9 of 18 Discharge Instructions: Cleanse the wound with wound cleanser prior to applying a clean dressing using gauze sponges, not tissue or cotton balls. Prim Dressing: Promogran Prisma Matrix, 4.34 (sq in) (silver collagen) (Generic) 1 x Per Day/30 Days ary Discharge Instructions: Moisten collagen with saline or hydrogel Secondary Dressing: Woven Gauze Sponge, Non-Sterile 4x4 in (Generic) 1 x Per Day/30 Days Discharge Instructions: Apply over primary dressing as directed. Secured With: 66M Medipore H Soft Cloth Surgical T ape, 4 x 10 (in/yd) (Generic) 1 x Per Day/30 Days Discharge  Instructions: Secure with tape as directed. Wound #12 - T Great oe Wound Laterality: Left Cleanser: Soap and Water 1 x Per Day/30 Days Discharge Instructions: May shower and wash wound with dial antibacterial soap and water prior to dressing change. Cleanser: Wound Cleanser 1 x Per Day/30 Days Discharge Instructions: Cleanse the wound with wound cleanser prior to applying a clean dressing using gauze sponges, not tissue or cotton balls. Prim Dressing: Promogran Prisma Matrix, 4.34 (sq in) (silver collagen) (Generic) 1 x Per Day/30 Days ary Discharge Instructions: Moisten collagen with saline or hydrogel Secondary Dressing: Woven Gauze Sponge, Non-Sterile 4x4 in (Generic) 1 x Per Day/30 Days Discharge Instructions: Apply over primary dressing as directed. Secured With: 66M Medipore H Soft Cloth Surgical T ape, 4 x 10 (in/yd) (Generic) 1 x Per Day/30 Days Discharge Instructions: Secure with tape as directed. Wound #13 - T Great oe Wound Laterality: Dorsal, Left Cleanser: Soap and Water 1 x Per Day/30 Days Discharge Instructions: May shower and wash wound with dial antibacterial soap and water prior to dressing change. Cleanser: Wound Cleanser 1 x Per Day/30 Days Discharge Instructions: Cleanse the wound with wound cleanser prior to applying a clean dressing using gauze sponges, not tissue or cotton balls. Prim Dressing: Promogran Prisma Matrix, 4.34 (sq in) (silver collagen) (Generic) 1 x Per Day/30 Days ary Discharge Instructions: Moisten collagen with saline or hydrogel Secondary Dressing: Woven  Gauze Sponge, Non-Sterile 4x4 in (Generic) 1 x Per Day/30 Days Discharge Instructions: Apply over primary dressing as directed. Secured With: 30M Medipore H Soft Cloth Surgical T ape, 4 x 10 (in/yd) (Generic) 1 x Per Day/30 Days Discharge Instructions: Secure with tape as directed. Wound #15 - T Great oe Wound Laterality: Dorsal, Right Cleanser: Soap and Water 1 x Per Day/30 Days Discharge  Instructions: May shower and wash wound with dial antibacterial soap and water prior to dressing change. Cleanser: Wound Cleanser 1 x Per Day/30 Days Discharge Instructions: Cleanse the wound with wound cleanser prior to applying a clean dressing using gauze sponges, not tissue or cotton balls. Prim Dressing: Promogran Prisma Matrix, 4.34 (sq in) (silver collagen) (Generic) 1 x Per Day/30 Days ary Discharge Instructions: Moisten collagen with saline or hydrogel Secondary Dressing: Woven Gauze Sponge, Non-Sterile 4x4 in (Generic) 1 x Per Day/30 Days Discharge Instructions: Apply over primary dressing as directed. Secured With: 30M Medipore H Soft Cloth Surgical T ape, 4 x 10 (in/yd) (Generic) 1 x Per Day/30 Days Discharge Instructions: Secure with tape as directed. Wound #9 - Calcaneus Wound Laterality: Left Cleanser: Soap and Water 1 x Per Day/30 Days Discharge Instructions: May shower and wash wound with dial antibacterial soap and water prior to dressing change. Cleanser: Wound Cleanser 1 x Per Day/30 Days Discharge Instructions: Cleanse the wound with wound cleanser prior to applying a clean dressing using gauze sponges, not tissue or cotton balls. Prim Dressing: Promogran Prisma Matrix, 4.34 (sq in) (silver collagen) (Generic) 1 x Per Day/30 Days ary Discharge Instructions: Moisten collagen with saline or hydrogel Secondary Dressing: ALLEVYN Heel 4 1/2in x 5 1/2in / 10.5cm x 13.5cm (Generic) 1 x Per Day/30 Days Discharge Instructions: Apply over primary dressing as directed. Secondary Dressing: Woven Gauze Sponge, Non-Sterile 4x4 in (Generic) 1 x Per Day/30 Days Discharge Instructions: Apply over primary dressing as directed. [name], [name] ([id number]) [id number] Page 10 of 18 Secured With: 30M Medipore H Soft Cloth Surgical T ape, 4 x 10 (in/yd) (Generic) 1 x Per Day/30 Days Discharge Instructions: Secure with tape as directed. Electronic Signature(s) Signed:  05/15/2023 12:47:20 PM By: Duanne Guess MD FACS Signed: 05/19/2023 3:11:25 PM By: Redmond Pulling RN, BSN Entered By: Redmond Pulling on 05/15/2023 09:38:19 -------------------------------------------------------------------------------- Problem List Details Patient Name: Date of Service: Megan Reap. 05/15/2023 11:45 A [name] Medical Record Number: [id number] Patient Account Number: [id number] Date of Birth/Sex: Treating RN: March 01, 1928 (89 y.o. 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 03/10/2023 No Yes without evidence of necrosis 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 03/10/2023 No Yes without evidence of necrosis L97.522 Non-pressure chronic ulcer of other part of left foot with fat layer exposed 03/18/2023 No Yes I73.9 Peripheral vascular disease, unspecified 03/10/2023 No Yes I50.32 Chronic diastolic (congestive) heart failure 03/10/2023 No Yes N18.32 Chronic kidney disease, stage 3b 03/10/2023 No Yes I87.2 Venous insufficiency (chronic) (peripheral) 03/10/2023 No Yes Inactive Problems Resolved Problems Electronic Signature(s) Signed: 07/28/2023 2:11:27 PM By: Duanne Guess MD FACS Previous Signature: 07/28/2023 2:08:59 PM Version By: Duanne Guess MD FACS Previous Signature: 05/15/2023 12:34:56 PM Version By: Duanne Guess MD FACS [name], [name] ([id number]) [id number] Page 11 of  18 Previous Signature: 05/15/2023 12:34:56 PM Version By: Duanne Guess MD FACS Previous Signature: 05/15/2023 12:16:08 PM Version By: Duanne Guess MD FACS Entered By: Duanne Guess  on 07/28/2023 11:11:27 -------------------------------------------------------------------------------- Progress Note Details Patient Name: Date of Service: Megan Reap. 05/15/2023 11:45 A [name] Medical Record Number: [id number] Patient Account Number: [id number] Date of Birth/Sex: Treating RN: [birth year] (89 y.o. F) Primary Care Provider: [name] Other Clinician: Referring Provider: Treating Provider/Extender: Claudie Leach Weeks in Treatment: 9 Subjective Chief Complaint Information obtained from Patient 01/28/2022; right second toe amputation site dehiscence and bilateral lower extremity wounds. 03/10/2023: pressure ulcers of right heel, ulcers on toes History of Present Illness (HPI) Admission 01/28/2022 Ms. 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 She states she has tried Medihoney and silver alginate in the past to this area with little benefit. She also has 2 small areas limited to skin breakdown to her lower extremities bilaterally. She has chronic venous insufficiency but not has not been wearing her compression stockings. She states she bumped her legs against an object and not so the wound started. She has been using Medihoney to the sites. She denies signs of infection. 6/2; patient presents for follow-up. She had an x-ray of her right foot done at last clinic visit and this was negative for evidence of osteomyelitis. She also had a wound culture done that showed extra high levels of Staph aureus. I recommended Keystone antibiotics for this and this was ordered. She had ABIs with TBI's done as well that showed monophasic waveforms to the right foot with TBI of 0 and an ABI of 0.52.  Urgent referral was made to vein and vascular and she saw Dr. Durwin Nora on 6/1, yesterday and he recommended an arteriogram. This is scheduled for 6/16. Patient also reports a new wound to the right great toe. This is a blister that has ruptured. She also reports increased erythema to the toe. 6/6; the patient was worked in urgently today at the insistence of her daughter out of concern for a new wound on the lateral part of the plantar right great toe. She has her original postsurgical wound after the amputation of the right second toe, she has a wound on the medial part of the right great toe. The patient is apparently going for an angiogram by Dr. Durwin Nora in 2 weeks time. 6/13; patient presents for follow-up. She has been using bacitracin to the abrasion on the right great toe. She has been using collagen and Keystone antibiotic to the amputation site. She has no issues or complaints today. She denies signs of infection. 6/22; patient presents for follow-up. She states that her abdominal aortogram was canceled due to her renal function. She has been using Keystone antibiotics to the amputation site and Medihoney to the right great toe wound. At the pace of the right great toe she has a slitlike open area that she thinks was caused by the tape from the dressing. 6/29; patient presents for follow-up. She has been using Keystone antibiotics to the amputation site along with collagen. She has been using Medihoney to the right great toe wound. She has no other wounds. She denies signs of infection. 7/13; patient presents for follow-up. She has been  using Keystone antibiotics and collagen to the wound sites. She currently denies signs of infection. She states she is scheduled to see me nephrology next month. 7/20; patient presents for follow-up. She continue Keystone antibiotics and collagen to the wound sites. She has no issues or complaints today. 8/1; patient presents for follow up. She continues to use  keystone antibiotics and collagen to the wound sites. She has no issues or complaints today. 8/15; patient presents for follow-up. She has been using Keystone antibiotics and collagen to the wound sites. She followed up with her nephrologist who made medication changes. She is supposed to get a repeat BMP in 2 weeks. Her decrease in renal function was a limiting factor in obtaining an arteriogram for potential intervention for revascularization. She currently denies signs of infection. 9/2; patient presents for follow-up. She has been using Keystone antibiotic and collagen to the wound beds. She has no issues or complaints today. Reports there has been improvement in kidney function however not cleared to have her arteriogram just yet. 10/10; patient presents for follow-up. She has been using Keystone antibiotic and collagen to the wound beds. She reports 2 new wounds 1 to the anterior right lower extremity and another to the plantar aspect of the right foot. She states that the right plantar foot wound was caused by the home health nurse changing the dressing and causing a skin tear. She is not sure how the right anterior leg wound started. It appears to be from trauma. She denies signs of infection. 10/19; patient presents for follow-up. She has been using Keystone antibiotic and collagen to the right great toe wound. She is been using silver alginate to the right anterior and right plantar foot wound. She has been using Tubigrip to the right lower extremity. The plantar foot wound has healed. She has no issues or complaints today. 11/3; since the patient was last here she was seen in urgent care apparently for an area on the dorsal aspect of the right fifth toe perhaps over the PIP. I saw a picture of this on the daughter's cell phone. There was slough on this. Urgent care gave him doxycycline. She is also changed the dressing to all wounds back the [facility] and collagen which includes her right leg  and left first toe 11/16; patient presents for follow-up. She has a new wound to the left knee. She states she fell. She has been using antibiotic ointment and collagen to this area. She has been using collagen and Keystone antibiotic to the right great toe wound. The anterior right leg wound is healed. She denies signs of infection. 11/30; patient presents for follow-up. The right great toe wound has healed. She has 1 remaining wound to the left knee. She has been using Hydrofera Blue and Medihoney here. [name], [name] ([id number]) [id number] Page 12 of 18 12/19; patient presents for follow-up. Her left knee wound has healed. She has no issues or complaints today. 09/30/2022 Patient's daughter called for an appointment due to increased swelling to her lower extremities bilaterally. Today patient presents with increased swelling to her lower extremities although there is no increased redness or warmth. She was evaluated by her nephrologist who increased her diuretics. Per patient and daughter her swelling has gone down to the leg Over the past several days. She does not have any open wounds. She was advised to elevate her legs and not consume excess salt By her PCP and nephrologist. Patient has compression stockings that she has been using sporadically.  READMISSION 03/10/2023 Since her last visit to the clinic, the patient has been hospitalized at least twice, in February with COVID pneumonia and CHF exacerbation. She was discharged to a skilled nursing facility and then readmitted in April with fevers. She was noted to have pressure ulcers on her heels and sacrum. Per family declined skilled nursing placement upon discharge and she has apparently been residing at home. She has followed with the vascular surgery clinic regarding peripheral artery disease and due to ulcerations on her feet, she ultimately underwent arteriography with balloon angioplasties of the superficial  femoral artery, popliteal artery and peroneal artery on the left, along with shockwave ultrasound assisted balloon angioplasty of the popliteal artery and distal SFA. Given her multiple significant medical comorbidities, she is not a candidate for open surgery. She is deemed to be maximally vascularized this procedure, which was performed on 04 March 2023. T oday, she has a stage III pressure ulcer on her left heel and wounds on the PIP joints of her right third and left second toes. No sacral wound is present and the ulcer on her left heel has closed. 03/18/2023: She has a new ulcer on her left great toe. It was just noticed yesterday and the etiology is unknown. The fat layer is exposed and there is some slough accumulation. The other wounds are all essentially unchanged in size. There is a little bit more granulation tissue on the other toe wounds, but bone is still frankly exposed on both sides. The heel ulcer has thick slough accumulation. 03/26/2023: She has a new wound on the tip of her left third toe. It is black. Neither the patient nor her daughter are aware of how it began. The other wounds are stable. 03/31/2023: The culture that I took from her left third toe returned with MRSA. We contacted the patient's daughter to have her stop levofloxacin and start doxycycline, but she has not yet picked up the new antibiotic. All of her wounds have accumulated slough. Bone remains exposed. She is scheduled to follow- up with vascular surgery tomorrow to evaluate the patency of her revascularization. 04/09/2023: She saw her vascular surgeon and her ABIs are improved; her TBI's could not be checked due to her bandages. She did finally start taking the prescribed doxycycline. All of her wounds look about the same today. 04/23/2023: No significant changes to any of her wounds aside from being slightly smaller other than the left dorsal great toe wound, which is stable. She has accumulated slough and eschar on  all of the surfaces. 04/30/2023: All of the wounds are stable with the exception of the left dorsal great toe wound which now has tendon exposed in addition to bone. There is slough and eschar accumulation on all sites. 05/15/2023: The patient was in the hospital and missed her appointment last week. The wound at the tip of her left third toe is healed. Everything else looks the same. Patient History Information obtained from Patient, Caregiver, Chart. Family History Cancer - Siblings, Heart Disease, Stroke - Mother, No family history of Diabetes. Social History Former smoker - quit 2003, Marital Status - Widowed, Alcohol Use - Never, Drug Use - No History, Caffeine Use - Never. Medical History Eyes Patient has history of Cataracts Hematologic/Lymphatic Patient has history of Anemia Respiratory Denies history of Chronic Obstructive Pulmonary Disease (COPD) Cardiovascular Patient has history of Arrhythmia - a-fib, Congestive Heart Failure, Hypotension, Peripheral Venous Disease Musculoskeletal Patient has history of 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 Neurologic Patient  has history of Neuropathy Hospitalization/Surgery History - Abdominal aortogram w/lower extremity. - Amputation toe (Right). - Colonoscopy. - Elbow surgery. - Laparoscopic hysterectomy. Medical A Surgical History Notes nd Hematologic/Lymphatic Thrombocytopenia, Anticoagulant long-term use Respiratory Pulmonary embolus Gastrointestinal Hiatal hernia Endocrine Hyperthyroidism, Hypothyroidism Genitourinary CKD stage III Musculoskeletal arthritis July, Kanna [name] ([id number]) [id number] Page 13 of 18 Objective Constitutional Hypertensive, asymptomatic. no acute distress, but appears fatigued. Vitals Time Taken: 12:20 PM, Height: 64 in, Weight: 157 lbs, BMI: 26.9, Temperature: 97.8 F, Pulse: 60 bpm, Respiratory Rate: 18 breaths/min, Blood Pressure: 150/71  mmHg. Respiratory Normal work of breathing on supplementary oxygen. General Notes: 05/15/2023: The wound at the tip of her left third toe is healed. Everything else looks the same. Integumentary (Hair, Skin) Wound #10 status is Open. Original cause of wound was Pressure Injury. The date acquired was: 10/09/2022. The wound has been in treatment 9 weeks. The wound is located on the Left T Second. The wound measures 0.3cm length x 0.9cm width x 0.1cm depth; 0.212cm^2 area and 0.021cm^3 volume. There is oe bone, joint, and Fat Layer (Subcutaneous Tissue) exposed. There is no tunneling or undermining noted. There is a small amount of serosanguineous drainage noted. The wound margin is distinct with the outline attached to the wound base. There is medium (34-66%) pink granulation within the wound bed. There is a medium (34-66%) amount of necrotic tissue within the wound bed including Adherent Slough. The periwound skin appearance had no abnormalities noted for texture. The periwound skin appearance had no abnormalities noted for moisture. The periwound skin appearance had no abnormalities noted for color. Periwound temperature was noted as No Abnormality. Wound #11 status is Open. Original cause of wound was Pressure Injury. The date acquired was: 10/09/2022. The wound has been in treatment 9 weeks. The wound is located on the Right T Second. The wound measures 0.5cm length x 0.8cm width x 0.1cm depth; 0.314cm^2 area and 0.031cm^3 volume. There is oe bone, joint, and Fat Layer (Subcutaneous Tissue) exposed. There is no tunneling or undermining noted. There is a medium amount of serosanguineous drainage noted. The wound margin is distinct with the outline attached to the wound base. There is large (67-100%) red granulation within the wound bed. There is a small (1-33%) amount of necrotic tissue within the wound bed including Adherent Slough. The periwound skin appearance had no abnormalities noted for texture.  The periwound skin appearance had no abnormalities noted for moisture. The periwound skin appearance had no abnormalities noted for color. Periwound temperature was noted as No Abnormality. The periwound has tenderness on palpation. Wound #12 status is Open. Original cause of wound was Gradually Appeared. The date acquired was: 03/17/2023. The wound has been in treatment 8 weeks. The wound is located on the Left T Great. The wound measures 0.3cm length x 0.3cm width x 0.1cm depth; 0.071cm^2 area and 0.007cm^3 volume. There is Fat oe Layer (Subcutaneous Tissue) exposed. There is no tunneling or undermining noted. There is a none present amount of drainage noted. The wound margin is flat and intact. There is no granulation within the wound bed. There is no necrotic tissue within the wound bed. The periwound skin appearance had no abnormalities noted for texture. The periwound skin appearance had no abnormalities noted for moisture. The periwound skin appearance had no abnormalities noted for color. Periwound temperature was noted as No Abnormality. The periwound has tenderness on palpation. Wound #13 status is Open. Original cause of wound was Gradually Appeared. The date acquired was: 03/24/2023. The wound  has been in treatment 7 weeks. The wound is located on the Left,Dorsal T Great. The wound measures 1cm length x 1.9cm width x 0.1cm depth; 1.492cm^2 area and 0.149cm^3 volume. oe There is Fat Layer (Subcutaneous Tissue) exposed. There is no tunneling or undermining noted. There is a medium amount of serosanguineous drainage noted. The wound margin is distinct with the outline attached to the wound base. There is medium (34-66%) red granulation within the wound bed. There is a medium (34-66%) amount of necrotic tissue within the wound bed including Adherent Slough. The periwound skin appearance had no abnormalities noted for texture. The periwound skin appearance had no abnormalities noted for moisture.  The periwound skin appearance did not exhibit: Erythema. Periwound temperature was noted as No Abnormality. The periwound has tenderness on palpation. Wound #14 status is Open. Original cause of wound was Gradually Appeared. The date acquired was: 03/24/2023. The wound has been in treatment 7 weeks. The wound is located on the Left T Third. The wound measures 0cm length x 0cm width x 0cm depth; 0cm^2 area and 0cm^3 volume. There is no tunneling or oe undermining noted. There is a none present amount of drainage noted. There is no granulation within the wound bed. There is no necrotic tissue within the wound bed. The periwound skin appearance had no abnormalities noted for color. The periwound skin appearance did not exhibit: Scarring, Dry/Scaly, Maceration. Periwound temperature was noted as No Abnormality. The periwound has tenderness on palpation. Wound #15 status is Open. Original cause of wound was Gradually Appeared. The date acquired was: 03/26/2023. The wound has been in treatment 7 weeks. The wound is located on the Right,Dorsal T [place]. The wound measures 0.9cm length x 0.6cm width x 0.1cm depth; 0.424cm^2 area and 0.042cm^3 volume. oe There is Fat Layer (Subcutaneous Tissue) exposed. There is no tunneling or undermining noted. There is a medium amount of serosanguineous drainage noted. The wound margin is flat and intact. There is large (67-100%) red granulation within the wound bed. There is a small (1-33%) amount of necrotic tissue within the wound bed including Adherent Slough. The periwound skin appearance had no abnormalities noted for texture. The periwound skin appearance had no abnormalities noted for moisture. The periwound skin appearance had no abnormalities noted for color. Periwound temperature was noted as No Abnormality. The periwound has tenderness on palpation. Wound #9 status is Open. Original cause of wound was Pressure Injury. The date acquired was: 10/22/2022. The wound  has been in treatment 9 weeks. The wound is located on the Left Calcaneus. The wound measures 0.6cm length x 0.4cm width x 0.3cm depth; 0.188cm^2 area and 0.057cm^3 volume. There is Fat Layer (Subcutaneous Tissue) exposed. There is no tunneling or undermining noted. There is a small amount of serous drainage noted. The wound margin is distinct with the outline attached to the wound base. There is small (1-33%) pink, pale granulation within the wound bed. There is a large (67-100%) amount of necrotic tissue within the wound bed including Adherent Slough. The periwound skin appearance had no abnormalities noted for texture. The periwound skin appearance had no abnormalities noted for moisture. The periwound skin appearance had no abnormalities noted for color. Periwound temperature was noted as No Abnormality. The periwound has tenderness on palpation. 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 There was a Selective/Open Wound Non-Viable Tissue oe Debridement with a total area of 0.21 sq cm performed by Duanne Guess, MD. With the following instrument(s): Curette, Forceps, and Scissors to remove Non-Viable tissue/material. Material removed includes [facility] after achieving pain control using Lidocaine 5% topical ointment. No specimens  were taken. A time out was conducted at 12:25, prior to the start of the procedure. A Minimum amount of bleeding was controlled with Pressure. The procedure was tolerated well with a pain level of 3 throughout and a pain level of 2 following the procedure. Post Debridement Measurements: 0.3cm length x 0.9cm width x 0.1cm depth; 0.021cm^3 volume. Post debridement Stage noted as Category/Stage IV. Character of Wound/Ulcer Post Debridement is stable. Post procedure Diagnosis Wound #10: Same as Pre-Procedure General Notes: scribed for Dr. Lady Gary by Zenaida Deed, RN. Wound #11 Pre-procedure diagnosis of Wound #11 is a Pressure Ulcer located on the Right T Second . There was a Selective/Open Wound Non-Viable Tissue oe Debridement with a total area of 0.31 sq cm performed by Duanne Guess, MD. With the following instrument(s): Curette to remove Non-Viable tissue/material. Material removed includes [facility] after achieving pain control using Lidocaine 5% topical ointment. No specimens were taken. A time out was conducted at 12:25, prior to the start of the procedure. A Minimum amount of bleeding was controlled with Pressure. The procedure was tolerated well with a pain level of 3 throughout and a pain level of 2 following the procedure. Post Debridement Measurements: 0.5cm length x 0.8cm width x 0.1cm depth; 0.031cm^3 volume. Post debridement Stage noted as Category/Stage IV. Character of Wound/Ulcer Post Debridement is improved. Post procedure Diagnosis Wound #11: Same as Pre-Procedure General Notes: scribed for Dr. Lady Gary by Zenaida Deed, RN. Wound #12 Pre-procedure diagnosis of Wound #12 is an Arterial Insufficiency Ulcer located on the Left T Great .Severity of Tissue Pre Debridement is: Fat layer oe exposed. There was a Selective/Open Wound Non-Viable Tissue Debridement with a total area of 0.07 sq cm performed by Duanne Guess, MD. With the following instrument(s): Curette to remove  Non-Viable tissue/material. Material removed includes Callus and Slough and after achieving pain control using Lidocaine 5% topical ointment. No specimens were taken. A time out was conducted at 12:25, prior to the start of the procedure. A Minimum amount of bleeding was controlled with Pressure. The procedure was tolerated well with a pain level of 3 throughout and a pain level of 2 following the procedure. Post Debridement Measurements: 0.3cm length x 0.3cm width x 0.1cm depth; 0.007cm^3 volume. Character of Wound/Ulcer Post Debridement is improved. Severity of Tissue Post Debridement is: Fat layer exposed. Post procedure Diagnosis Wound #12: Same as Pre-Procedure General Notes: scribed for Dr. Lady Gary by Zenaida Deed, RN. Wound #13 Pre-procedure diagnosis of Wound #13 is an Arterial Insufficiency Ulcer located on the Left,Dorsal T Great .Severity of Tissue Pre Debridement is: Fat layer oe exposed. There was a Excisional Skin/Subcutaneous Tissue/Muscle Debridement with a total area of 1.49 sq cm performed by Duanne Guess, MD. With the following instrument(s): Curette, Forceps, and Scissors to remove Viable and Non-Viable tissue/material. Material removed includes Tendon and Slough and after achieving pain control  using Lidocaine 5% topical ointment. No specimens were taken. A time out was conducted at 12:25, prior to the start of the procedure. A Minimum amount of bleeding was controlled with Pressure. The procedure was tolerated well with a pain level of 3 throughout and a pain level of 2 following the procedure. Post Debridement Measurements: 1cm length x 1.9cm width x 0.1cm depth; 0.149cm^3 volume. Character of Wound/Ulcer Post Debridement is improved. Severity of Tissue Post Debridement is: Fat layer exposed. Post procedure Diagnosis Wound #13: Same as Pre-Procedure General Notes: scribed for Dr. Lady Gary by Zenaida Deed, RN. Wound #15 Pre-procedure diagnosis of Wound #15 is an Arterial  Insufficiency Ulcer located on the Right,Dorsal T Great .Severity of Tissue Pre Debridement is: Fat oe layer exposed. There was a Selective/Open Wound Non-Viable Tissue Debridement with a total area of 0.42 sq cm performed by Duanne Guess, MD. With the following instrument(s): Curette to remove Non-Viable tissue/material. Material removed includes [facility] after achieving pain control using Lidocaine 5% topical ointment. No specimens were taken. A time out was conducted at 12:25, prior to the start of the procedure. A Minimum amount of bleeding was controlled with Pressure. The procedure was tolerated well with a pain level of 3 throughout and a pain level of 2 following the procedure. Post Debridement Measurements: 0.9cm length x 0.6cm width x 0.1cm depth; 0.042cm^3 volume. Character of Wound/Ulcer Post Debridement is stable. Severity of Tissue Post Debridement is: Fat layer exposed. Post procedure Diagnosis Wound #15: Same as Pre-Procedure General Notes: scribed for Dr. Lady Gary by Zenaida Deed, RN. Wound #9 Pre-procedure diagnosis of Wound #9 is a Pressure Ulcer located on the Left Calcaneus . There was a Excisional Skin/Subcutaneous Tissue Debridement with a total area of 0.19 sq cm performed by Duanne Guess, MD. With the following instrument(s): Curette to remove Viable and Non-Viable tissue/material. Material removed includes Subcutaneous Tissue and Slough and after achieving pain control using Lidocaine 5% topical ointment. No specimens were taken. A time out was conducted at 12:25, prior to the start of the procedure. A Minimum amount of bleeding was controlled with Pressure. The procedure was tolerated well with a pain level of 3 throughout and a pain level of 2 following the procedure. Post Debridement Measurements: 0.6cm length x 0.4cm width x 0.3cm depth; 0.057cm^3 volume. Post debridement Stage noted as Category/Stage III. Character of Wound/Ulcer Post Debridement is  improved. Post procedure Diagnosis Wound #9: Same as Pre-Procedure General Notes: scribed for Dr. Lady Gary by Zenaida Deed, RN Late entry 07/28/2023 for date of procedure 05/15/2023: The documentation has been amended to accurately reflect the post-debridement stage of the wound. Note: This entry occurs well after 24 to 48 hours after the date of the original documentation.. Plan Follow-up Appointments: Return Appointment in 1 week. - Dr. Lady Gary - room 1 Thurs 9/12 @ 2:45 pm Anesthetic: (In clinic) Topical Lidocaine 4% applied to wound bed Bathing/ Shower/ Hygiene: May shower and wash wound with soap and water. Edema Control - Lymphedema / SCD / Other: Elevate legs to the level of the heart or above for 30 minutes daily and/or when sitting for 3-4 times a day throughout the day. Avoid standing for long periods of time. Patient to wear own compression stockings every day. Additional Orders / Instructions: Follow Nutritious Diet - add in protein shakes every day to diet - recommend premier protein 500 mg x3 a day vitamin C, zinc 30-50 mg per day ANTHONIA, ROPER ([id number]) [id number] Page 15 of 18 Home Health: No change in  wound care orders this week; continue Home Health for wound care. May utilize formulary equivalent dressing for wound treatment orders unless otherwise specified. Other Home Health Orders/Instructions: - Medi Home WOUND #10: - T Second Wound Laterality: Left oe Cleanser: Soap and Water 1 x Per Day/30 Days Discharge Instructions: May shower and wash wound with dial antibacterial soap and water prior to dressing change. Cleanser: Wound Cleanser 1 x Per Day/30 Days Discharge Instructions: Cleanse the wound with wound cleanser prior to applying a clean dressing using gauze sponges, not tissue or cotton balls. Prim Dressing: Promogran Prisma Matrix, 4.34 (sq in) (silver collagen) (Generic) 1 x Per Day/30 Days ary Discharge Instructions: Moisten  collagen with saline or hydrogel Secondary Dressing: Woven Gauze Sponge, Non-Sterile 4x4 in (Generic) 1 x Per Day/30 Days Discharge Instructions: Apply over primary dressing as directed. Secured With: 56M Medipore H Soft Cloth Surgical T ape, 4 x 10 (in/yd) (Generic) 1 x Per Day/30 Days Discharge Instructions: Secure with tape as directed. WOUND #11: - T Second Wound Laterality: Right oe Cleanser: Soap and Water 1 x Per Day/30 Days Discharge Instructions: May shower and wash wound with dial antibacterial soap and water prior to dressing change. Cleanser: Wound Cleanser 1 x Per Day/30 Days Discharge Instructions: Cleanse the wound with wound cleanser prior to applying a clean dressing using gauze sponges, not tissue or cotton balls. Prim Dressing: Promogran Prisma Matrix, 4.34 (sq in) (silver collagen) (Generic) 1 x Per Day/30 Days ary Discharge Instructions: Moisten collagen with saline or hydrogel Secondary Dressing: Woven Gauze Sponge, Non-Sterile 4x4 in (Generic) 1 x Per Day/30 Days Discharge Instructions: Apply over primary dressing as directed. Secured With: 56M Medipore H Soft Cloth Surgical T ape, 4 x 10 (in/yd) (Generic) 1 x Per Day/30 Days Discharge Instructions: Secure with tape as directed. WOUND #12: - T Great Wound Laterality: Left oe Cleanser: Soap and Water 1 x Per Day/30 Days Discharge Instructions: May shower and wash wound with dial antibacterial soap and water prior to dressing change. Cleanser: Wound Cleanser 1 x Per Day/30 Days Discharge Instructions: Cleanse the wound with wound cleanser prior to applying a clean dressing using gauze sponges, not tissue or cotton balls. Prim Dressing: Promogran Prisma Matrix, 4.34 (sq in) (silver collagen) (Generic) 1 x Per Day/30 Days ary Discharge Instructions: Moisten collagen with saline or hydrogel Secondary Dressing: Woven Gauze Sponge, Non-Sterile 4x4 in (Generic) 1 x Per Day/30 Days Discharge Instructions: Apply over primary  dressing as directed. Secured With: 56M Medipore H Soft Cloth Surgical T ape, 4 x 10 (in/yd) (Generic) 1 x Per Day/30 Days Discharge Instructions: Secure with tape as directed. WOUND #13: - T Great Wound Laterality: Dorsal, Left oe Cleanser: Soap and Water 1 x Per Day/30 Days Discharge Instructions: May shower and wash wound with dial antibacterial soap and water prior to dressing change. Cleanser: Wound Cleanser 1 x Per Day/30 Days Discharge Instructions: Cleanse the wound with wound cleanser prior to applying a clean dressing using gauze sponges, not tissue or cotton balls. Prim Dressing: Promogran Prisma Matrix, 4.34 (sq in) (silver collagen) (Generic) 1 x Per Day/30 Days ary Discharge Instructions: Moisten collagen with saline or hydrogel Secondary Dressing: Woven Gauze Sponge, Non-Sterile 4x4 in (Generic) 1 x Per Day/30 Days Discharge Instructions: Apply over primary dressing as directed. Secured With: 56M Medipore H Soft Cloth Surgical T ape, 4 x 10 (in/yd) (Generic) 1 x Per Day/30 Days Discharge Instructions: Secure with tape as directed. WOUND #15: - T Great Wound Laterality: Dorsal, Right oe Cleanser:  Soap and Water 1 x Per Day/30 Days Discharge Instructions: May shower and wash wound with dial antibacterial soap and water prior to dressing change. Cleanser: Wound Cleanser 1 x Per Day/30 Days Discharge Instructions: Cleanse the wound with wound cleanser prior to applying a clean dressing using gauze sponges, not tissue or cotton balls. Prim Dressing: Promogran Prisma Matrix, 4.34 (sq in) (silver collagen) (Generic) 1 x Per Day/30 Days ary Discharge Instructions: Moisten collagen with saline or hydrogel Secondary Dressing: Woven Gauze Sponge, Non-Sterile 4x4 in (Generic) 1 x Per Day/30 Days Discharge Instructions: Apply over primary dressing as directed. Secured With: 458M Medipore H Soft Cloth Surgical T ape, 4 x 10 (in/yd) (Generic) 1 x Per Day/30 Days Discharge Instructions: Secure  with tape as directed. WOUND #9: - Calcaneus Wound Laterality: Left Cleanser: Soap and Water 1 x Per Day/30 Days Discharge Instructions: May shower and wash wound with dial antibacterial soap and water prior to dressing change. Cleanser: Wound Cleanser 1 x Per Day/30 Days Discharge Instructions: Cleanse the wound with wound cleanser prior to applying a clean dressing using gauze sponges, not tissue or cotton balls. Prim Dressing: Promogran Prisma Matrix, 4.34 (sq in) (silver collagen) (Generic) 1 x Per Day/30 Days ary Discharge Instructions: Moisten collagen with saline or hydrogel Secondary Dressing: ALLEVYN Heel 4 1/2in x 5 1/2in / 10.5cm x 13.5cm (Generic) 1 x Per Day/30 Days Discharge Instructions: Apply over primary dressing as directed. Secondary Dressing: Woven Gauze Sponge, Non-Sterile 4x4 in (Generic) 1 x Per Day/30 Days Discharge Instructions: Apply over primary dressing as directed. Secured With: 458M Medipore H Soft Cloth Surgical T ape, 4 x 10 (in/yd) (Generic) 1 x Per Day/30 Days Discharge Instructions: Secure with tape as directed. 05/15/2023: The patient was in the hospital and missed her appointment last week. The wound at the tip of her left third toe is healed. Everything else looks the same. I used a curette to debride slough from all of the toe wounds. I also used scissors and forceps to debride some tendon off of the dorsal left great toe ulcer. I debrided slough and subcutaneous tissue from the left heel ulcer. Given the patient's overall poor health status, I do not anticipate healing the wounds with exposed bone, but hopefully we can minimize her risks for sepsis. We will continue Prisma silver collagen to all of the wound sites. Follow-up in 1 week. Electronic Signature(s) Signed: 07/28/2023 2:11:49 PM By: Duanne Guess MD FACS Previous Signature: 07/28/2023 2:05:21 PM Version By: Duanne Guess MD FACS Previous Signature: 05/19/2023 9:00:52 AM Version By: Duanne Guess MD FACS Previous Signature: 05/18/2023 8:07:44 AM Version By: Duanne Guess MD FACS Previous Signature: 05/15/2023 12:39:33 PM Version By: Duanne Guess MD FACS Noguchi, [name] ([id number]) [id number] Page 16 of 18 Previous Signature: 05/15/2023 12:39:33 PM Version By: Duanne Guess MD FACS Entered By: Duanne Guess on 07/28/2023 11:11:48 -------------------------------------------------------------------------------- HxROS Details Patient Name: Date of Service: Ethelda Chick TTIE [name]. 05/15/2023 11:45 A [name] Medical Record Number: [id number] Patient Account Number: [id number] Date of Birth/Sex: Treating RN: 1928-02-13 (89 y.o. F) Primary Care Provider: [name] Other Clinician: Referring Provider: Treating Provider/Extender: Tiajuana Amass in Treatment: 9 Information Obtained From Patient Caregiver Chart Eyes Medical History: Positive for: Cataracts Hematologic/Lymphatic Medical History: Positive for: Anemia Past Medical History Notes: Thrombocytopenia, Anticoagulant long-term use Respiratory Medical History: Negative for: Chronic Obstructive Pulmonary Disease (COPD) Past Medical History Notes: Pulmonary embolus Cardiovascular Medical History: Positive for: Arrhythmia - a-fib; Congestive Heart Failure; Hypotension; Peripheral Venous Disease  Gastrointestinal Medical History: Past Medical History Notes: Hiatal hernia Endocrine Medical History: Past Medical History Notes: Hyperthyroidism, Hypothyroidism Genitourinary Medical History: Past Medical History Notes: CKD stage III Musculoskeletal Medical History: Positive for: 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 Past Medical History Notes: arthritis Neurologic Medical History: Positive for: Neuropathy HBO Extended History Items Dittus, Letonia [name] ([id number]) [id number] Page 17 of 18 Eyes: Cataracts Immunizations Pneumococcal  Vaccine: Received Pneumococcal Vaccination: No Implantable Devices None Hospitalization / Surgery History Type of Hospitalization/Surgery Abdominal aortogram w/lower extremity Amputation toe (Right) Colonoscopy Elbow surgery Laparoscopic hysterectomy Family and Social History Cancer: Yes - Siblings; Diabetes: No; Heart Disease: Yes; Stroke: Yes - Mother; Former smoker - quit 2003; Marital Status - Widowed; Alcohol Use: Never; Drug Use: No History; Caffeine Use: Never; Financial Concerns: No; Food, Clothing or Shelter Needs: No; Support System Lacking: No; Transportation Concerns: No Electronic Signature(s) Signed: 05/15/2023 12:47:20 PM By: Duanne Guess MD FACS Entered By: Duanne Guess on 05/15/2023 09:36:22 -------------------------------------------------------------------------------- SuperBill Details Patient Name: Date of Service: Megan Reap. 05/15/2023 Medical Record Number: [id number] Patient Account Number: [id number] Date of Birth/Sex: Treating RN: Feb 03, 1928 (89 y.o. 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 without evidence of necrosis L97.522 Non-pressure chronic ulcer of other part of left foot with fat layer exposed I73.9 Peripheral vascular disease, unspecified I50.32 Chronic diastolic (congestive) heart failure N18.32 Chronic kidney disease, stage 3b I87.2 Venous insufficiency (chronic) (peripheral) Facility Procedures : CPT4 Code: [id number] Description: 11042 - DEB SUBQ TISSUE 20 SQ CM/< ICD-10 Diagnosis Description L89.623 Pressure ulcer of left heel,  stage 3 Modifier: Quantity: 1 : CPT4 Code: 84696295 Description: 11043 - DEB MUSC/FASCIA 20 SQ CM/< ICD-10 Diagnosis Description 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 witho Modifier: ut evidence of necr Quantity: 1 osis : Luty, HAT CPT4 Code: [id number] TIE [name] ([id number] Description: 97597 - DEBRIDE WOUND 1ST 20 SQ CM OR < ICD-10 Diagnosis Description 48) [phone] 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 without L97.526 Non-pressure chronic ulcer of other part of  left foot with bone involvement without e L97.522 Non-pressure chronic ulcer of other part of left foot with fat layer exposed Modifier: _[id number] evidence of necrosi vidence of necrosis Quantity: 1 f Page 18 of 18 s Physician Procedures : CPT4 Code Description Modifier [phone] 99213 - WC PHYS LEVEL 3 - EST PT ICD-10 Diagnosis Description L89.623 Pressure ulcer of left heel, 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 without evidence of  necr 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 without evidence of 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 Quantity: 1 osis sis : [id number] 11042 - WC PHYS SUBQ TISS 20 SQ CM ICD-10 Diagnosis Description L89.623 Pressure ulcer of left heel, stage 3 Quantity: 1 : [id number] 11043 - WC PHYS DEBR MUSCLE/FASCIA 20 SQ CM ICD-10 Diagnosis Description 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 without evidence of necro Quantity: 1 sis : [id number] 97597 - WC PHYS DEBR WO ANESTH 20  SQ CM ICD-10 Diagnosis Description 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 without evidence of necr L97.526 Non-pressure chronic ulcer of other part of left foot  with bone involvement without evidence of  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 Quantity: 1 osis sis Electronic Signature(s) Signed: 07/28/2023 2:12:42 PM By: Duanne Guess MD FACS Previous Signature: 05/19/2023 9:03:49 AM Version By: Duanne Guess MD FACS Previous Signature: 05/15/2023 12:40:37 PM Version By: Duanne Guess MD FACS Entered By: Duanne Guess on 07/28/2023 11:12:41

## 2023-05-19 NOTE — Progress Notes (Signed)
MIAISABELLA, SILL ([id number]) [id number] Page 1 of 17 Visit Report for 05/15/2023 Arrival Information Details Patient Name: Date of Service: BRAVERY, COONFIELD. 05/15/2023 11:45 A M Medical Record Number: [id number] Patient Account Number: [id number] Date of Birth/Sex: Treating RN: 02/23/1928 (89 y.o. [name] Primary Care Khair Chasteen: [name] Other Clinician: Referring Candice Tobey: Treating Dennys Traughber/Extender: Claudie Leach Weeks in Treatment: 9 Visit Information History Since Last Visit Added or deleted any medications: No Patient Arrived: Wheel Chair Any new allergies or adverse reactions: No Arrival Time: 12:04 Had a fall or experienced change in No Accompanied By: self activities of daily living that may affect Transfer Assistance: None risk of falls: Patient Identification Verified: Yes Signs or symptoms of abuse/neglect since last visito No Secondary Verification Process Completed: Yes Hospitalized since last visit: No Patient Requires Transmission-Based Precautions: No Implantable device outside of the clinic excluding No Patient Has Alerts: Yes cellular tissue based products placed in the center Patient Alerts: ABI R: 0.48 L: 0.46 2/24 since last visit: TBI R: 0.39 L: 0.21 2/24 Has Dressing in Place as Prescribed: Yes Pain Present Now: No Electronic Signature(s) Signed: 05/15/2023 12:28:55 PM By: Redmond Pulling RN, BSN Entered By: Redmond Pulling on 05/15/2023 09:04:49 -------------------------------------------------------------------------------- Encounter Discharge Information Details Patient Name: Date of Service: [name] 05/15/2023 11:45 A M Medical Record Number: [id number] Patient Account Number: [id number] Date of Birth/Sex: Treating RN: [birth year] (89 y.o. [name] Primary Care Addalie Calles: [name] Other Clinician: Referring Brunetta Newingham: Treating Miyeko Mahlum/Extender: Claudie Leach Weeks  in Treatment: 9 Encounter Discharge Information Items Post Procedure Vitals Discharge Condition: Stable Temperature (F): 97.8 Ambulatory Status: Wheelchair Pulse (bpm): 60 Discharge Destination: Home Respiratory Rate (breaths/min): 18 Transportation: Private Auto Blood Pressure (mmHg): 150/71 Accompanied By: daughter Schedule Follow-up Appointment: Yes Clinical Summary of Care: Patient Declined Electronic Signature(s) Signed: 05/19/2023 3:11:25 PM By: Redmond Pulling RN, BSN Entered By: Redmond Pulling on 05/15/2023 09:41:07 Ridlon, [name] ([id number]) [id number] Page 2 of 17 -------------------------------------------------------------------------------- Lower Extremity Assessment Details Patient Name: Date of Service: ANYHA, TONN 05/15/2023 11:45 A M Medical Record Number: [id number] Patient Account Number: [id number] Date of Birth/Sex: Treating RN: March 11, 1928 (89 y.o. [name] Primary Care Halana Deisher: [name] Other Clinician: Referring Zeidy Tayag: Treating Xoe Hoe/Extender: Claudie Leach Weeks in Treatment: 9 Edema Assessment Assessed: [Left: No] [Right: No] Edema: [Left: Yes] [Right: Yes] Calf Left: Right: Point of Measurement: From Medial Instep 39.3 cm 39 cm Ankle Left: Right: Point of Measurement: From Medial Instep 23.5 cm 22 cm Vascular Assessment Pulses: Dorsalis Pedis Palpable: [Left:Yes] [Right:Yes] Extremity colors, hair growth, and conditions: Extremity Color: [Left:Hyperpigmented] [Right:Hyperpigmented] Hair Growth on Extremity: [Left:No] [Right:No] Temperature of Extremity: [Left:Warm < 3 seconds] [Right:Warm < 3 seconds] Electronic Signature(s) Signed: 05/15/2023 12:28:55 PM By: Redmond Pulling RN, BSN Entered By: Redmond Pulling on 05/15/2023 09:06:51 -------------------------------------------------------------------------------- Multi Wound Chart Details Patient Name: Date of Service: [name] 05/15/2023 11:45 A M Medical Record Number: [id number] Patient Account Number: [id number] Date of Birth/Sex: Treating RN: 11-05-1927 (89 y.o. F) Primary Care Lameeka Schleifer: [name] Other Clinician: Referring Yvette Roark: Treating Nitesh Pitstick/Extender: Claudie Leach Weeks in Treatment: 9 Vital Signs Height(in): 64 Pulse(bpm): 60 Weight(lbs): 157 Blood Pressure(mmHg): 150/71 Body Mass Index(BMI): 26.9 Temperature(F): 97.8 Respiratory Rate(breaths/min): 18 [10:Photos:] [[id number] Page 3 of 17] Left T Second oe Right T Second oe Left T Great oe Wound Location: Pressure Injury Pressure Injury Gradually Appeared Wounding Event: Pressure Ulcer Pressure Ulcer Arterial Insufficiency Ulcer Primary Etiology:  Cataracts, Anemia, Arrhythmia, Cataracts, Anemia, Arrhythmia, Cataracts, Anemia, Arrhythmia, Comorbid History: Congestive Heart Failure, Congestive Heart Failure, Congestive Heart Failure, Hypotension, Peripheral Venous Hypotension, Peripheral Venous Hypotension, Peripheral Venous Disease, Osteomyelitis, Neuropathy Disease, Osteomyelitis, Neuropathy Disease, Osteomyelitis, Neuropathy 10/09/2022 10/09/2022 03/17/2023 Date Acquired: 9 9 8  Weeks of Treatment: Open Open Open Wound Status: No No No Wound Recurrence: 0.3x0.9x0.1 0.5x0.8x0.1 0.3x0.3x0.1 Measurements L x W x D (cm) 0.212 0.314 0.071 A (cm) : rea 0.021 0.031 0.007 Volume (cm) : 70.00% 55.60% 85.70% % Reduction in A rea: 70.40% 56.30% 85.70% % Reduction in Volume: Category/Stage IV Category/Stage IV Full Thickness Without Exposed Classification: Support Structures Small Medium None Present Exudate A mount: Serosanguineous Serosanguineous N/A Exudate Type: red, brown red, brown N/A Exudate Color: Distinct, outline attached Distinct, outline attached Flat and Intact Wound Margin: Medium (34-66%) Large (67-100%) None Present (0%) Granulation A mount: Pink Red  N/A Granulation Quality: Medium (34-66%) Small (1-33%) None Present (0%) Necrotic A mount: Fat Layer (Subcutaneous Tissue): Yes Fat Layer (Subcutaneous Tissue): Yes Fat Layer (Subcutaneous Tissue): Yes Exposed Structures: Joint: Yes Joint: Yes Fascia: No Bone: Yes Bone: Yes Tendon: No Fascia: No Fascia: No Muscle: No Tendon: No Tendon: No Joint: No Muscle: No Muscle: No Bone: No Small (1-33%) None Large (67-100%) Epithelialization: Debridement - Selective/Open Wound Debridement - Selective/Open Wound Debridement - Selective/Open Wound Debridement: Pre-procedure Verification/Time Out 12:25 12:25 12:25 Taken: Lidocaine 5% topical ointment Lidocaine 5% topical ointment Lidocaine 5% topical ointment Pain Control: [employer], [employer] Tissue Debrided: Non-Viable Tissue Non-Viable Tissue Non-Viable Tissue Level: 0.21 0.31 0.07 Debridement A (sq cm): rea Curette, Forceps, Scissors Curette Curette Instrument: Minimum Minimum Minimum Bleeding: Pressure Pressure Pressure Hemostasis A chieved: 3 3 3  Procedural Pain: 2 2 2  Post Procedural Pain: Procedure was tolerated well Procedure was tolerated well Procedure was tolerated well Debridement Treatment Response: 0.3x0.9x0.1 0.5x0.8x0.1 0.3x0.3x0.1 Post Debridement Measurements L x W x D (cm) 0.021 0.031 0.007 Post Debridement Volume: (cm) Category/Stage IV Category/Stage IV N/A Post Debridement Stage: No Abnormalities Noted No Abnormalities Noted No Abnormalities Noted Periwound Skin Texture: Dry/Scaly: Yes Dry/Scaly: Yes No Abnormalities Noted Periwound Skin Moisture: No Abnormalities Noted Rubor: No Erythema: No Periwound Skin Color: No Abnormality No Abnormality No Abnormality Temperature: N/A Yes Yes Tenderness on Palpation: Debridement Debridement Debridement Procedures Performed: Wound Number: [id number]  Photos: Left, Dorsal T Great oe Left T Third oe Right, Dorsal T Great oe Wound  Location: Gradually Appeared Gradually Appeared Gradually Appeared Wounding 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 Etiology: Cataracts, Anemia, Arrhythmia, Cataracts, Anemia, Arrhythmia, Cataracts, Anemia, Arrhythmia, Comorbid History: Congestive Heart Failure, Congestive Heart Failure, Congestive Heart Failure, Hypotension, Peripheral Venous Hypotension, Peripheral Venous Hypotension, Peripheral Venous Disease, Osteomyelitis, Neuropathy Disease, Osteomyelitis, Neuropathy Disease, Osteomyelitis, Neuropathy 03/24/2023 03/24/2023 03/26/2023 Date Acquired: 7 7 7  Weeks of Treatment: Open Open Open Wound Status: No No No Wound Recurrence: 1x1.9x0.1 0x0x0 0.9x0.6x0.1 Measurements L x W x D (cm) Ayyad, Rynn M ([id number]) [id number] Page 4 of 17 1.492 0 0.424 A (cm) : rea 0.149 0 0.042 Volume (cm) : -1788.60% 100.00% -497.20% % Reduction in Area: -1762.50% 100.00% -500.00% % Reduction in Volume: Full Thickness Without Exposed Full Thickness With Exposed Support Full Thickness Without Exposed Classification: Support Structures Structures Support Structures Medium None Present Medium Exudate A mount: Serosanguineous N/A Serosanguineous Exudate Type: red, brown N/A red, brown Exudate Color: Distinct, outline attached N/A Flat and Intact Wound Margin: Medium (34-66%) None Present (0%) Large (67-100%) Granulation A mount: Red N/A Red Granulation Quality:  Medium (34-66%) None Present (0%) Small (1-33%) Necrotic A mount: Fat Layer (Subcutaneous Tissue): Yes Fascia: No Fat Layer (Subcutaneous Tissue): Yes Exposed Structures: Fat Layer (Subcutaneous Tissue): No Fascia: No Tendon: No Tendon: No Muscle: No Muscle: No Joint: No Joint: No Bone: No Bone: No None Large (67-100%) Small (1-33%) Epithelialization: Debridement - Excisional N/A Debridement - Selective/Open  Wound Debridement: Pre-procedure Verification/Time Out 12:25 N/A 12:25 Taken: Lidocaine 5% topical ointment N/A Lidocaine 5% topical ointment Pain Control: Tendon, Slough N/A Slough Tissue Debrided: Skin/Subcutaneous Tissue/Muscle N/A Non-Viable Tissue Level: 1.49 N/A 0.42 Debridement A (sq cm): rea Curette, Forceps, Scissors N/A Curette Instrument: Minimum N/A Minimum Bleeding: Pressure N/A Pressure Hemostasis A chieved: 3 N/A 3 Procedural Pain: 2 N/A 2 Post Procedural Pain: Procedure was tolerated well N/A Procedure was tolerated well Debridement Treatment Response: 1x1.9x0.1 N/A 0.9x0.6x0.1 Post Debridement Measurements L x W x D (cm) 0.149 N/A 0.042 Post Debridement Volume: (cm) N/A N/A N/A Post Debridement Stage: No Abnormalities Noted Scarring: No No Abnormalities Noted Periwound Skin Texture: No Abnormalities Noted Maceration: No No Abnormalities Noted Periwound Skin Moisture: Dry/Scaly: No Erythema: No Hemosiderin Staining: Yes No Abnormalities Noted Periwound Skin Color: No Abnormality No Abnormality No Abnormality Temperature: Yes Yes Yes Tenderness on Palpation: Debridement N/A Debridement Procedures Performed: Wound Number: 9 N/A N/A Photos: N/A N/A Left Calcaneus N/A N/A Wound Location: Pressure Injury N/A N/A Wounding Event: Pressure Ulcer N/A N/A Primary Etiology: Cataracts, Anemia, Arrhythmia, N/A N/A Comorbid History: Congestive Heart Failure, Hypotension, Peripheral Venous Disease, Osteomyelitis, Neuropathy 10/22/2022 N/A N/A Date Acquired: 9 N/A N/A Weeks of Treatment: Open N/A N/A Wound Status: No N/A N/A Wound Recurrence: 0.6x0.4x0.3 N/A N/A Measurements L x W x D (cm) 0.188 N/A N/A A (cm) : rea 0.057 N/A N/A Volume (cm) : 43.00% N/A N/A % Reduction in A rea: 42.40% N/A N/A % Reduction in Volume: Category/Stage II N/A N/A Classification: Small N/A N/A Exudate A mount: Serous N/A N/A Exudate Type: amber N/A  N/A Exudate Color: Distinct, outline attached N/A N/A Wound Margin: Small (1-33%) N/A N/A Granulation A mount: Pink, Pale N/A N/A Granulation Quality: Large (67-100%) N/A N/A Necrotic A mount: Fat Layer (Subcutaneous Tissue): Yes N/A N/A Exposed Structures: Fascia: No Tendon: No Muscle: No Joint: No Bone: No Hearns, Khloi M ([id number]) [id number] Page 5 of 17 Small (1-33%) N/A N/A Epithelialization: Debridement - Excisional N/A N/A Debridement: 12:25 N/A N/A Pre-procedure Verification/Time Out Taken: Lidocaine 5% topical ointment N/A N/A Pain Control: Subcutaneous, Slough N/A N/A Tissue Debrided: Skin/Subcutaneous Tissue N/A N/A Level: 0.19 N/A N/A Debridement A (sq cm): rea Curette N/A N/A Instrument: Minimum N/A N/A Bleeding: Pressure N/A N/A Hemostasis A chieved: 3 N/A N/A Procedural Pain: 2 N/A N/A Post Procedural Pain: Procedure was tolerated well N/A N/A Debridement Treatment Response: 0.6x0.4x0.3 N/A N/A Post Debridement Measurements L x W x D (cm) 0.057 N/A N/A Post Debridement Volume: (cm) Category/Stage II N/A N/A Post Debridement Stage: Callus: No N/A N/A Periwound Skin Texture: Dry/Scaly: Yes N/A N/A Periwound Skin Moisture: No Abnormalities Noted N/A N/A Periwound Skin Color: No Abnormality N/A N/A Temperature: Yes N/A N/A Tenderness on Palpation: N/A N/A N/A Procedures Performed: Treatment Notes Electronic Signature(s) Signed: 05/15/2023 12:35:05 PM By: Duanne Guess MD FACS Entered By: Duanne Guess on 05/15/2023 09:35:04 -------------------------------------------------------------------------------- Multi-Disciplinary Care Plan Details Patient Name: Date of Service: [name] 05/15/2023 11:45 A M Medical Record Number: [id number] Patient Account Number: [id number] Date of Birth/Sex: Treating RN: [birth year] (89 y.o. [name] Primary Care Laverda Stribling: Madilyn Fireman,  Tereso Newcomer Other  Clinician: Referring Kizzi Overbey: Treating Cono Gebhard/Extender: Tiajuana Amass in Treatment: 9 Multidisciplinary Care Plan reviewed with physician Active Inactive Abuse / Safety / Falls / Self Care Management Nursing Diagnoses: Impaired home maintenance Impaired physical mobility Potential for falls Goals: Patient/caregiver will verbalize/demonstrate measure taken to improve self care Date Initiated: 03/10/2023 Target Resolution Date: 06/12/2023 Goal Status: Active Patient/caregiver will verbalize/demonstrate measures taken to improve the patient's personal safety Date Initiated: 03/10/2023 Target Resolution Date: 06/12/2023 Goal Status: Active Interventions: Assess fall risk on admission and as needed Assess: immobility, friction, shearing, incontinence upon admission and as needed Provide education on fall prevention Notes: Wound/Skin Impairment Carlucci, [name] ([id number]) [id number] Page 6 of 17 Nursing Diagnoses: Impaired tissue integrity Knowledge deficit related to ulceration/compromised skin integrity Goals: Patient/caregiver will verbalize understanding of skin care regimen Date Initiated: 03/10/2023 Target Resolution Date: 06/12/2023 Goal Status: Active Interventions: Assess patient/caregiver ability to obtain necessary supplies Assess patient/caregiver ability to perform ulcer/skin care regimen upon admission and as needed Assess ulceration(s) every visit Treatment Activities: Skin care regimen initiated : 03/10/2023 Topical wound management initiated : 03/10/2023 Notes: Electronic Signature(s) Signed: 05/15/2023 1:19:41 PM By: Zenaida Deed RN, BSN Entered By: Zenaida Deed on 05/15/2023 09:09:03 -------------------------------------------------------------------------------- Pain Assessment Details Patient Name: Date of Service: [name] 05/15/2023 11:45 A M Medical Record Number: [id number] Patient Account Number:  [id number] Date of Birth/Sex: Treating RN: 1928/07/30 (89 y.o. [name] Primary Care Kamrin Spath: [name] Other Clinician: Referring Zadin Lange: Treating Rishawn Walck/Extender: Claudie Leach Weeks in Treatment: 9 Active Problems Location of Pain Severity and Description of Pain Patient Has Paino No Site Locations Pain Management and Medication Current Pain Management: Electronic Signature(s) Signed: 05/15/2023 12:28:55 PM By: Redmond Pulling RN, BSN Entered By: Redmond Pulling on 05/15/2023 09:05:05 Poteat, [name] ([id number]) [id number] Page 7 of 17 -------------------------------------------------------------------------------- Patient/Caregiver Education Details Patient Name: Date of Service: YAMILI, MOONEYHAN 9/6/2024andnbsp11:45 A M Medical Record Number: [id number] Patient Account Number: [id number] Date of Birth/Gender: Treating RN: [birth year] (89 y.o. [name] Primary Care Physician: [name] Other Clinician: Referring Physician: Treating Physician/Extender: Tiajuana Amass in Treatment: 9 Education Assessment Education Provided To: Patient Education Topics Provided Pressure: Methods: Explain/Verbal Responses: Reinforcements needed, State content correctly Wound Debridement: Methods: Explain/Verbal Responses: Reinforcements needed, State content correctly Wound/Skin Impairment: Methods: Explain/Verbal Responses: Reinforcements needed, State content correctly Electronic Signature(s) Signed: 05/19/2023 3:11:25 PM By: Redmond Pulling RN, BSN Entered By: Redmond Pulling on 05/15/2023 09:38:56 -------------------------------------------------------------------------------- Wound Assessment Details Patient Name: Date of Service: [name] 05/15/2023 11:45 A M Medical Record Number: [id number] Patient Account Number: [id number] Date of Birth/Sex: Treating RN: 1927-12-25 (89 y.o. [name] Primary Care Mirra Basilio: [name] Other Clinician: Referring Izzy Doubek: Treating Shulamis Wenberg/Extender: Claudie Leach Weeks in Treatment: 9 Wound Status Wound Number: 10 Primary Pressure Ulcer Etiology: Wound Location: Left T Second oe Wound Open Wounding Event: Pressure Injury Status: Date Acquired: 10/09/2022 Comorbid Cataracts, Anemia, Arrhythmia, Congestive Heart Failure, Weeks Of Treatment: 9 History: Hypotension, Peripheral Venous Disease, Osteomyelitis, Clustered Wound: No Neuropathy Photos Hakala, [name] ([id number]) [id number] Page 8 of 17 Wound Measurements Length: (cm) 0.3 Width: (cm) 0.9 Depth: (cm) 0.1 Area: (cm) 0.212 Volume: (cm) 0.021 % Reduction in Area: 70% % Reduction in Volume: 70.4% Epithelialization: Small (1-33%) Tunneling: No Undermining: No Wound Description Classification: Category/Stage IV Wound Margin: Distinct, outline attached Exudate Amount: Small Exudate Type: Serosanguineous Exudate Color: red, brown Foul Odor After Cleansing: No Slough/Fibrino No Wound  Bed Granulation Amount: Medium (34-66%) Exposed Structure Granulation Quality: Pink Fascia Exposed: No Necrotic Amount: Medium (34-66%) Fat Layer (Subcutaneous Tissue) Exposed: Yes Necrotic Quality: Adherent Slough Tendon Exposed: No Muscle Exposed: No Joint Exposed: Yes Bone Exposed: Yes Periwound Skin Texture Texture Color No Abnormalities Noted: Yes No Abnormalities Noted: Yes Moisture Temperature / Pain No Abnormalities Noted: Yes Temperature: No Abnormality Treatment Notes Wound #10 (Toe Second) Wound Laterality: Left Cleanser Soap and Water Discharge Instruction: May shower and wash wound with dial antibacterial soap and water prior to dressing change. Wound Cleanser Discharge Instruction: Cleanse the wound with wound cleanser prior to applying a clean dressing using gauze sponges, not tissue or cotton balls. Peri-Wound  Care Topical Primary Dressing Promogran Prisma Matrix, 4.34 (sq in) (silver collagen) Discharge Instruction: Moisten collagen with saline or hydrogel Secondary Dressing Woven Gauze Sponge, Non-Sterile 4x4 in Discharge Instruction: Apply over primary dressing as directed. Secured With 81M Medipore H Soft Cloth Surgical T ape, 4 x 10 (in/yd) Discharge Instruction: Secure with tape as directed. Compression Wrap Compression Stockings Add-Ons DONINIQUE, PAMPLONA ([id number]) [id number] Page 9 of 17 Electronic Signature(s) Signed: 05/15/2023 12:28:55 PM By: Redmond Pulling RN, BSN Entered By: Redmond Pulling on 05/15/2023 09:23:17 -------------------------------------------------------------------------------- Wound Assessment Details Patient Name: Date of Service: [name] 05/15/2023 11:45 A M Medical Record Number: [id number] Patient Account Number: [id number] Date of Birth/Sex: Treating RN: [birth year] (89 y.o. [name] Primary Care Deny Chevez: [name] Other Clinician: Referring Wrigley Winborne: Treating Sebastiana Wuest/Extender: Claudie Leach Weeks in Treatment: 9 Wound Status Wound Number: 11 Primary Pressure Ulcer Etiology: Wound Location: Right T Second oe Wound Open Wounding Event: Pressure Injury Status: Date Acquired: 10/09/2022 Comorbid Cataracts, Anemia, Arrhythmia, Congestive Heart Failure, Weeks Of Treatment: 9 History: Hypotension, Peripheral Venous Disease, Osteomyelitis, Clustered Wound: No Neuropathy Photos Wound Measurements Length: (cm) 0.5 Width: (cm) 0.8 Depth: (cm) 0.1 Area: (cm) 0.314 Volume: (cm) 0.031 % Reduction in Area: 55.6% % Reduction in Volume: 56.3% Epithelialization: None Tunneling: No Undermining: No Wound Description Classification: Category/Stage IV Wound Margin: Distinct, outline attached Exudate Amount: Medium Exudate Type: Serosanguineous Exudate Color: red, brown Foul Odor After Cleansing:  No Slough/Fibrino Yes Wound Bed Granulation Amount: Large (67-100%) Exposed Structure Granulation Quality: Red Fascia Exposed: No Necrotic Amount: Small (1-33%) Fat Layer (Subcutaneous Tissue) Exposed: Yes Necrotic Quality: Adherent Slough Tendon Exposed: No Muscle Exposed: No Joint Exposed: Yes Bone Exposed: Yes Periwound Skin Texture Texture Color No Abnormalities Noted: Yes No Abnormalities Noted: Yes Moisture Temperature / Pain No Abnormalities Noted: Yes Temperature: No Abnormality Tenderness on Palpation: Yes Gilvin, Allean Judie Petit ([id number]) [id number] Page 10 of 17 Treatment Notes Wound #11 (Toe Second) Wound Laterality: Right Cleanser Soap and Water Discharge Instruction: May shower and wash wound with dial antibacterial soap and water prior to dressing change. Wound Cleanser Discharge Instruction: Cleanse the wound with wound cleanser prior to applying a clean dressing using gauze sponges, not tissue or cotton balls. Peri-Wound Care Topical Primary Dressing Promogran Prisma Matrix, 4.34 (sq in) (silver collagen) Discharge Instruction: Moisten collagen with saline or hydrogel Secondary Dressing Woven Gauze Sponge, Non-Sterile 4x4 in Discharge Instruction: Apply over primary dressing as directed. Secured With 81M Medipore H Soft Cloth Surgical T ape, 4 x 10 (in/yd) Discharge Instruction: Secure with tape as directed. Compression Wrap Compression Stockings Add-Ons Electronic Signature(s) Signed: 05/15/2023 12:28:55 PM By: Redmond Pulling RN, BSN Entered By: Redmond Pulling on 05/15/2023 09:23:40 -------------------------------------------------------------------------------- Wound Assessment Details Patient Name: Date of Service: Elders, [name] 05/15/2023 11:45 A M  Medical Record Number: [id number] Patient Account Number: [id number] Date of Birth/Sex: Treating RN: 09-07-1928 (89 y.o. [name] Primary Care Kalden Wanke: [name] Other  Clinician: Referring Fantasha Daniele: Treating Linnell Swords/Extender: Claudie Leach Weeks in Treatment: 9 Wound Status Wound Number: 12 Primary Arterial Insufficiency Ulcer Etiology: Wound Location: Left T Great oe Wound Open Wounding Event: Gradually Appeared Status: Date Acquired: 03/17/2023 Comorbid Cataracts, Anemia, Arrhythmia, Congestive Heart Failure, Weeks Of Treatment: 8 History: Hypotension, Peripheral Venous Disease, Osteomyelitis, Clustered Wound: No Neuropathy Photos Samano, [name] ([id number]) [id number] Page 11 of 17 Wound Measurements Length: (cm) 0.3 Width: (cm) 0.3 Depth: (cm) 0.1 Area: (cm) 0.071 Volume: (cm) 0.007 % Reduction in Area: 85.7% % Reduction in Volume: 85.7% Epithelialization: Large (67-100%) Tunneling: No Undermining: No Wound Description Classification: Full Thickness Without Exposed Support Structures Wound Margin: Flat and Intact Exudate Amount: None Present Foul Odor After Cleansing: No Slough/Fibrino No Wound Bed Granulation Amount: None Present (0%) Exposed Structure Necrotic Amount: None Present (0%) Fascia Exposed: No Fat Layer (Subcutaneous Tissue) Exposed: Yes Tendon Exposed: No Muscle Exposed: No Joint Exposed: No Bone Exposed: No Periwound Skin Texture Texture Color No Abnormalities Noted: Yes No Abnormalities Noted: Yes Moisture Temperature / Pain No Abnormalities Noted: Yes Temperature: No Abnormality Tenderness on Palpation: Yes Treatment Notes Wound #12 (Toe Great) Wound Laterality: Left Cleanser Soap and Water Discharge Instruction: May shower and wash wound with dial antibacterial soap and water prior to dressing change. Wound Cleanser Discharge Instruction: Cleanse the wound with wound cleanser prior to applying a clean dressing using gauze sponges, not tissue or cotton balls. Peri-Wound Care Topical Primary Dressing Promogran Prisma Matrix, 4.34 (sq in) (silver  collagen) Discharge Instruction: Moisten collagen with saline or hydrogel Secondary Dressing Woven Gauze Sponge, Non-Sterile 4x4 in Discharge Instruction: Apply over primary dressing as directed. Secured With 20M Medipore H Soft Cloth Surgical T ape, 4 x 10 (in/yd) Discharge Instruction: Secure with tape as directed. Compression Wrap Compression Stockings Add-Ons Electronic Signature(s) Signed: 05/15/2023 12:28:55 PM By: Redmond Pulling RN, BSN Entered By: Redmond Pulling on 05/15/2023 09:24:10 Wound Assessment Details -------------------------------------------------------------------------------- Modena Morrow ([id number]) [id number] Page 12 of 17 Patient Name: Date of Service: JAILENNE, NAM. 05/15/2023 11:45 A M Medical Record Number: [id number] Patient Account Number: [id number] Date of Birth/Sex: Treating RN: [birth year] (89 y.o. [name] Primary Care Zaleigh Bermingham: [name] Other Clinician: Referring Yezenia Fredrick: Treating Beola Vasallo/Extender: Claudie Leach Weeks in Treatment: 9 Wound Status Wound Number: 13 Primary Arterial Insufficiency Ulcer Etiology: Wound Location: Left, Dorsal T Great oe Wound Open Wounding Event: Gradually Appeared Status: Date Acquired: 03/24/2023 Comorbid Cataracts, Anemia, Arrhythmia, Congestive Heart Failure, Weeks Of Treatment: 7 History: Hypotension, Peripheral Venous Disease, Osteomyelitis, Clustered Wound: No Neuropathy Photos Wound Measurements Length: (cm) 1 % Reduction in Area: -1788.6% Width: (cm) 1.9 % Reduction in Volume: -1762.5% Depth: (cm) 0.1 Epithelialization: None Area: (cm) 1.492 Tunneling: No Volume: (cm) 0.149 Undermining: No Wound Description Classification: Full Thickness Without Exposed Support Structures Foul Odor After Cleansing: No Wound Margin: Distinct, outline attached Slough/Fibrino Yes Exudate Amount: Medium Exudate Type: Serosanguineous Exudate Color: red,  brown Wound Bed Granulation Amount: Medium (34-66%) Exposed Structure Granulation Quality: Red Fat Layer (Subcutaneous Tissue) Exposed: Yes Necrotic Amount: Medium (34-66%) Necrotic Quality: Adherent Slough Periwound Skin Texture Texture Color No Abnormalities Noted: Yes No Abnormalities Noted: No Erythema: No Moisture No Abnormalities Noted: Yes Temperature / Pain Temperature: No Abnormality Tenderness on Palpation: Yes Treatment Notes Wound #13 (Toe Great) Wound Laterality: Dorsal, Left Cleanser Soap and  Water Discharge Instruction: May shower and wash wound with dial antibacterial soap and water prior to dressing change. Wound Cleanser Discharge Instruction: Cleanse the wound with wound cleanser prior to applying a clean dressing using gauze sponges, not tissue or cotton balls. Peri-Wound Care Topical Primary Dressing Promogran Prisma Matrix, 4.34 (sq in) (silver collagen) Gilder, Virgia M ([id number]) [id number] Page 13 of 17 Discharge Instruction: Moisten collagen with saline or hydrogel Secondary Dressing Woven Gauze Sponge, Non-Sterile 4x4 in Discharge Instruction: Apply over primary dressing as directed. Secured With 80M Medipore H Soft Cloth Surgical T ape, 4 x 10 (in/yd) Discharge Instruction: Secure with tape as directed. Compression Wrap Compression Stockings Add-Ons Electronic Signature(s) Signed: 05/15/2023 12:28:55 PM By: Redmond Pulling RN, BSN Entered By: Redmond Pulling on 05/15/2023 09:24:37 -------------------------------------------------------------------------------- Wound Assessment Details Patient Name: Date of Service: [name] 05/15/2023 11:45 A M Medical Record Number: [id number] Patient Account Number: [id number] Date of Birth/Sex: Treating RN: [birth year] (89 y.o. [name] Primary Care Chasten Blaze: [name] Other Clinician: Referring Gilberto Stanforth: Treating Sonya Gunnoe/Extender: Claudie Leach Weeks in Treatment: 9 Wound Status Wound Number: 14 Primary Arterial Insufficiency Ulcer Etiology: Wound Location: Left T Third oe Wound Open Wounding Event: Gradually Appeared Status: Date Acquired: 03/24/2023 Comorbid Cataracts, Anemia, Arrhythmia, Congestive Heart Failure, Weeks Of Treatment: 7 History: Hypotension, Peripheral Venous Disease, Osteomyelitis, Clustered Wound: No Neuropathy Photos Wound Measurements Length: (cm) Width: (cm) Depth: (cm) Area: (cm) Volume: (cm) 0 % Reduction in Area: 100% 0 % Reduction in Volume: 100% 0 Epithelialization: Large (67-100%) 0 Tunneling: No 0 Undermining: No Wound Description Classification: Full Thickness With Exposed Support Structures Exudate Amount: None Present Foul Odor After Cleansing: No Slough/Fibrino No Wound Bed Granulation Amount: None Present (0%) Exposed Structure Necrotic Amount: None Present (0%) Fascia Exposed: No Cloer, Floria M ([id number]) [id number] Page 14 of 17 Fat Layer (Subcutaneous Tissue) Exposed: No Tendon Exposed: No Muscle Exposed: No Joint Exposed: No Bone Exposed: No Periwound Skin Texture Texture Color No Abnormalities Noted: No No Abnormalities Noted: Yes Scarring: No Temperature / Pain Temperature: No Abnormality Moisture No Abnormalities Noted: No Tenderness on Palpation: Yes Dry / Scaly: No Maceration: No Electronic Signature(s) Signed: 05/15/2023 12:28:55 PM By: Redmond Pulling RN, BSN Entered By: Redmond Pulling on 05/15/2023 09:25:02 -------------------------------------------------------------------------------- Wound Assessment Details Patient Name: Date of Service: [name] 05/15/2023 11:45 A M Medical Record Number: [id number] Patient Account Number: [id number] Date of Birth/Sex: Treating RN: [birth year] (89 y.o. [name] Primary Care Donalyn Schneeberger: [name] Other Clinician: Referring Yaris Ferrell: Treating Jeanice Dempsey/Extender:  Claudie Leach Weeks in Treatment: 9 Wound Status Wound Number: 15 Primary Arterial Insufficiency Ulcer Etiology: Wound Location: Right, Dorsal T Great oe Wound Open Wounding Event: Gradually Appeared Status: Date Acquired: 03/26/2023 Comorbid Cataracts, Anemia, Arrhythmia, Congestive Heart Failure, Weeks Of Treatment: 7 History: Hypotension, Peripheral Venous Disease, Osteomyelitis, Clustered Wound: No Neuropathy Photos Wound Measurements Length: (cm) 0.9 Width: (cm) 0.6 Depth: (cm) 0.1 Area: (cm) 0.424 Volume: (cm) 0.042 % Reduction in Area: -497.2% % Reduction in Volume: -500% Epithelialization: Small (1-33%) Tunneling: No Undermining: No Wound Description Classification: Full Thickness Without Exposed Suppo Wound Margin: Flat and Intact Exudate Amount: Medium Exudate Type: Serosanguineous Exudate Color: red, brown rt Structures Foul Odor After Cleansing: No Slough/Fibrino No Wound Bed Spindle, Loreena M ([id number]) [id number] Page 15 of 17 Granulation Amount: Large (67-100%) Exposed Structure Granulation Quality: Red Fascia Exposed: No Necrotic Amount: Small (1-33%) Fat Layer (Subcutaneous Tissue) Exposed: Yes Necrotic Quality: Adherent Slough Tendon Exposed: No Muscle  Exposed: No Joint Exposed: No Bone Exposed: No Periwound Skin Texture Texture Color No Abnormalities Noted: Yes No Abnormalities Noted: Yes Moisture Temperature / Pain No Abnormalities Noted: Yes Temperature: No Abnormality Tenderness on Palpation: Yes Treatment Notes Wound #15 (Toe Great) Wound Laterality: Dorsal, Right Cleanser Soap and Water Discharge Instruction: May shower and wash wound with dial antibacterial soap and water prior to dressing change. Wound Cleanser Discharge Instruction: Cleanse the wound with wound cleanser prior to applying a clean dressing using gauze sponges, not tissue or cotton balls. Peri-Wound Care Topical Primary  Dressing Promogran Prisma Matrix, 4.34 (sq in) (silver collagen) Discharge Instruction: Moisten collagen with saline or hydrogel Secondary Dressing Woven Gauze Sponge, Non-Sterile 4x4 in Discharge Instruction: Apply over primary dressing as directed. Secured With 67M Medipore H Soft Cloth Surgical T ape, 4 x 10 (in/yd) Discharge Instruction: Secure with tape as directed. Compression Wrap Compression Stockings Add-Ons Electronic Signature(s) Signed: 05/15/2023 12:28:55 PM By: Redmond Pulling RN, BSN Entered By: Redmond Pulling on 05/15/2023 09:25:43 -------------------------------------------------------------------------------- Wound Assessment Details Patient Name: Date of Service: [name] 05/15/2023 11:45 A M Medical Record Number: [id number] Patient Account Number: [id number] Date of Birth/Sex: Treating RN: 01/07/1928 (89 y.o. [name] Primary Care Kenitra Leventhal: [name] Other Clinician: Referring Janaiya Beauchesne: Treating Karam Dunson/Extender: Claudie Leach Weeks in Treatment: 9 Wound Status Wound Number: 9 Primary Pressure Ulcer Etiology: Wound Location: Left Calcaneus Wound Open Wounding Event: Pressure Injury Status: Date Acquired: 10/22/2022 Comorbid Cataracts, Anemia, Arrhythmia, Congestive Heart Failure, Weeks Of Treatment: 9 History: Hypotension, Peripheral Venous Disease, Osteomyelitis, Clustered Wound: No Ahmed, Chereese M ([id number]) [id number] Page 16 of 17 Clustered Wound: No Neuropathy Photos Wound Measurements Length: (cm) 0.6 Width: (cm) 0.4 Depth: (cm) 0.3 Area: (cm) 0.188 Volume: (cm) 0.057 % Reduction in Area: 43% % Reduction in Volume: 42.4% Epithelialization: Small (1-33%) Tunneling: No Undermining: No Wound Description Classification: Category/Stage II Wound Margin: Distinct, outline attached Exudate Amount: Small Exudate Type: Serous Exudate Color: amber Foul Odor After Cleansing:  No Slough/Fibrino Yes Wound Bed Granulation Amount: Small (1-33%) Exposed Structure Granulation Quality: Pink, Pale Fascia Exposed: No Necrotic Amount: Large (67-100%) Fat Layer (Subcutaneous Tissue) Exposed: Yes Necrotic Quality: Adherent Slough Tendon Exposed: No Muscle Exposed: No Joint Exposed: No Bone Exposed: No Periwound Skin Texture Texture Color No Abnormalities Noted: Yes No Abnormalities Noted: Yes Moisture Temperature / Pain No Abnormalities Noted: Yes Temperature: No Abnormality Tenderness on Palpation: Yes Treatment Notes Wound #9 (Calcaneus) Wound Laterality: Left Cleanser Soap and Water Discharge Instruction: May shower and wash wound with dial antibacterial soap and water prior to dressing change. Wound Cleanser Discharge Instruction: Cleanse the wound with wound cleanser prior to applying a clean dressing using gauze sponges, not tissue or cotton balls. Peri-Wound Care Topical Primary Dressing Promogran Prisma Matrix, 4.34 (sq in) (silver collagen) Discharge Instruction: Moisten collagen with saline or hydrogel Secondary Dressing ALLEVYN Heel 4 1/2in x 5 1/2in / 10.5cm x 13.5cm Discharge Instruction: Apply over primary dressing as directed. Woven Gauze Sponge, Non-Sterile 4x4 in Discharge Instruction: Apply over primary dressing as directed. Secured With 67M Medipore H Soft Cloth Surgical T ape, 4 x 10 (in/yd) Discharge Instruction: Secure with tape as directed. MEICHELLE, READ ([id number]) [id number] Page 17 of 17 Compression Wrap Compression Stockings Add-Ons Electronic Signature(s) Signed: 05/15/2023 12:28:55 PM By: Redmond Pulling RN, BSN Entered By: Redmond Pulling on 05/15/2023 09:26:09 -------------------------------------------------------------------------------- Vitals Details Patient Name: Date of Service: [name] 05/15/2023 11:45 A M Medical Record Number: [id number] Patient Account Number:  [id number] Date of  Birth/Sex: Treating RN: 04-Jan-1928 (89 y.o. [name] Primary Care Breane Grunwald: [name] Other Clinician: Referring Theresea Trautmann: Treating Yitzhak Awan/Extender: Claudie Leach Weeks in Treatment: 9 Vital Signs Time Taken: 12:20 Temperature (F): 97.8 Height (in): 64 Pulse (bpm): 60 Weight (lbs): 157 Respiratory Rate (breaths/min): 18 Body Mass Index (BMI): 26.9 Blood Pressure (mmHg): 150/71 Reference Range: 80 - 120 mg / dl Electronic Signature(s) Signed: 05/15/2023 12:28:55 PM By: Redmond Pulling RN, BSN Entered By: Redmond Pulling on 05/15/2023 09:21:57

## 2023-05-20 ENCOUNTER — Telehealth (INDEPENDENT_AMBULATORY_CARE_PROVIDER_SITE_OTHER): Payer: Medicare HMO | Admitting: Cardiology

## 2023-05-20 ENCOUNTER — Encounter (HOSPITAL_BASED_OUTPATIENT_CLINIC_OR_DEPARTMENT_OTHER): Payer: Self-pay | Admitting: Cardiology

## 2023-05-20 VITALS — BP 144/72 | HR 65 | Ht 63.0 in | Wt 146.0 lb

## 2023-05-20 DIAGNOSIS — Z7901 Long term (current) use of anticoagulants: Secondary | ICD-10-CM

## 2023-05-20 DIAGNOSIS — I272 Pulmonary hypertension, unspecified: Secondary | ICD-10-CM | POA: Diagnosis not present

## 2023-05-20 DIAGNOSIS — D6869 Other thrombophilia: Secondary | ICD-10-CM

## 2023-05-20 DIAGNOSIS — I350 Nonrheumatic aortic (valve) stenosis: Secondary | ICD-10-CM | POA: Diagnosis not present

## 2023-05-20 DIAGNOSIS — I739 Peripheral vascular disease, unspecified: Secondary | ICD-10-CM

## 2023-05-20 DIAGNOSIS — Z7189 Other specified counseling: Secondary | ICD-10-CM

## 2023-05-20 DIAGNOSIS — I5032 Chronic diastolic (congestive) heart failure: Secondary | ICD-10-CM | POA: Diagnosis not present

## 2023-05-20 DIAGNOSIS — R600 Localized edema: Secondary | ICD-10-CM

## 2023-05-20 DIAGNOSIS — I4821 Permanent atrial fibrillation: Secondary | ICD-10-CM

## 2023-05-20 NOTE — Progress Notes (Signed)
Virtual Visit via Telephone Note   Because of [name]'s co-morbid illnesses, she is at least at moderate risk for complications without adequate follow up.  This format is felt to be most appropriate for this patient at this time.  The patient did not have access to video technology/had technical difficulties with video requiring transitioning to audio format only (telephone).  All issues noted in this document were discussed and addressed.  No physical exam could be performed with this format.  Please refer to the patient's chart for her consent to telehealth for [name].   The patient was identified using 2 identifiers.  Patient Location: Home Provider Location: Office/Clinic   Date:  05/20/2023   ID:  [name], DOB Jul 20, 1928, MRN [id number]  PCP:  Camie Patience, FNP  Cardiologist:  [name], MD PhD  Referring MD: Camie Patience, FNP   CC: follow up  History of Present Illness: .   [name] is a 89 y.o. female with a hx of atrial fibrillation on coumadin, essential hypertension, pulmonary hypertension, low flow low gradient aortic stenosis, PAD with wound, and chronic diastolic heart failure, here for follow up. Her initial consult (telemedicine) was 11/25/18.    Cardiac history: Previously followed by Dr. Orvis Brill at [facility] (notes under media tab). Per notes:  -echo 2014, lexiscan 2015 low risk,  -echo 2019 hyperdynamic EF, septal flattening, severe RV dilation, RVSP noted as critical (near systemic) pulmonary hypertension, severe biatrial enlargement, dilated coronary sinus ?persistent left SVC.  -Venous study 2019 with bilateral mild insufficiency -Echo 11/10/17 with hyperdynamic EF, septal flattening, severe RV dilation, severe TR, severe pulmonary hypertension (listed at 71 mmHg in note) -Echo 12/19/22 with EF 65-70%, RVSP 68.6, mod-severe TR, moderate low flow low gradient aortic stenosis (prior echo 10/25/22 noted severe low  flow low gradient but there was contamination from TR) -s/p peripheral angiography and angioplasty 03/04/23 to left SFA, popliteal, peroneal arteries   Today: Reviewed recent hospitalization. Had hyponatremia, cellulitis. Home health restarted today.   Nephrology initially recommended single dose tolvaptan, then resumption of bumex. However, on day of discharge they recommended to hold bumex/spironolactone and use salt tabs/fluid restriction/increase protein.  Seen by Dr. Karin Lieu 05/15/23. Severely edematous, requiring O2 at the visit.[name] Kitchen Recommended medical management, also discussed recommendation for hospice. Would consider repeat angiogram if wounds worsen.  Continues to have severe LE edema. Finished salt tabs yesterday. Had visit with PCP 9/5, put back on bumex due to edema, had labs done Has been limiting fluid intake.  Daughter [name] feels that she is stable to slightly improved since the hospitalization. We reviewed all the issues she is facing at this time. Discussed goals of care again today. They are concerned about having to stop PT and other treatments on hospice. [name] does see more frailty and confusion with each hospitalization.  ROS: ROS otherwise negative except as noted.   Studies Reviewed: [name] Kitchen    EKG:     (virtual visit, not ordered)  Physical Exam:   VS:  BP (!) 144/72   Pulse 65   Ht 5\' 3"  (1.6 m)   Wt 146 lb (66.2 kg)   BMI 25.86 kg/m    Wt Readings from Last 3 Encounters:  05/20/23 146 lb (66.2 kg)  05/06/23 155 lb 13.8 oz (70.7 kg)  04/06/23 156 lb (70.8 kg)    No distress Vitals reviewed Per report, severe LE edema  ASSESSMENT AND PLAN: .  Low flow low gradient aortic stenosis -initially reported as severe, then re-evaluated and noted as moderate -we have discussed at length -With her comorbidities, unclear that she would be able to tolerate TAVR and she could not tolerate open AVR. They agree and do not wish to pursue at this time.  Peripheral  edema 2/2 severe pulmonary hypertension and chronic diastolic heart failure Hyponatremia -Daughter very involved in her care. Reviewed recent hospitalization -Continue bumex, will need to monitor Na closely -was previously on spironolactone, held during recent admission -continue daily weights, fluid restriction -we have extensively discussed her pulmonary hypertension, see prior notes  Lower extremity wounds PAD -following with Dr. Edilia Bo and Dr. Karin Lieu -nonhealing lower extremity wounds, s/p peripheral angiogram. recently treated for infection, followed by wound care  Hypertension: -continue diuretics as above  Atrial fibrillation, permanent Slow ventricular response -CHA2DS2/VAS=at least 5 -on coumadin, managed per PCP.   Electrolyte abnormalities -hyponatremia, hypokalemia, hypochloremia in the past  -reviewed recommendations from nephrology during recent hospitalization -following labs with pcp  Goals of care: we have discussed now on several occasions, as has Dr. Karin Lieu. They are considering palliative care but are concerned about stopping treatments on hospice. She is DNR. They have spoken to palliative care in the past, not interested to speaking to them again right now. Will continue to address.  Very complex medical decision making. High risk for readmission, discussed today. I favor continued discussion for hospice as they will have access to more services this way, but I understand concern for stopping therapies. Will continue to address.  Plan for follow up: 4-6 weeks, virtual ok  Today, I have spent 50 minutes with the patient with telehealth technology discussing the above problems.  Additional time spent in chart review, documentation, and communication.   Signed, [name], MD   [name], MD, PhD, [facility] [place]  [facility]  [place]  [facility] at [facility] [address]  [address] [place], [place] [address] [phone]

## 2023-05-21 ENCOUNTER — Encounter (HOSPITAL_BASED_OUTPATIENT_CLINIC_OR_DEPARTMENT_OTHER): Payer: Medicare HMO | Admitting: General Surgery

## 2023-05-21 DIAGNOSIS — L89623 Pressure ulcer of left heel, stage 3: Secondary | ICD-10-CM | POA: Diagnosis not present

## 2023-05-21 NOTE — Progress Notes (Signed)
[name], [name] ([id number]) [id number] Page 1 of 18 Visit Report for 05/21/2023 Arrival Information Details Patient Name: Date of Service: [name], [name] 05/21/2023 2:45 PM Medical Record Number: [id number] Patient Account Number: [id number] Date of Birth/Sex: Treating RN: [birth year] (89 y.o. [name], Megan Primary Care [name]: [name] Other Clinician: Referring [name]: Treating [name]/Extender: [name] Weeks in Treatment: 10 Visit Information History Since Last Visit All ordered tests and consults were completed: No Patient Arrived: Wheel Chair Added or deleted any medications: No Arrival Time: 14:42 Any new allergies or adverse reactions: No Accompanied By: daughter Had a fall or experienced change in No Transfer Assistance: None activities of daily living that may affect Patient Identification Verified: Yes risk of falls: Secondary Verification Process Completed: Yes Signs or symptoms of abuse/neglect since last visito No Patient Requires Transmission-Based Precautions: No Hospitalized since last visit: No Patient Has Alerts: Yes Implantable device outside of the clinic excluding No Patient Alerts: ABI R: 0.48 L: 0.46 2/24 cellular tissue based products placed in the center TBI R: 0.39 L: 0.21 2/24 since last visit: Has Dressing in Place as Prescribed: Yes Pain Present Now: No Electronic Signature(s) Signed: 05/21/2023 4:16:21 PM By: Megan Deed RN, BSN Previous Signature: 05/21/2023 2:44:53 PM Version By: [name] Entered By: [name] on 05/21/2023 12:04:00 -------------------------------------------------------------------------------- Encounter Discharge Information Details Patient Name: Date of Service: Megan Reap. 05/21/2023 2:45 PM Medical Record Number: [id number] Patient Account Number: [id number] Date of Birth/Sex: Treating RN: [birth year] (89 y.o. [name] Primary Care  [name]: [name] Other Clinician: Referring [name]: Treating [name]/Extender: [name] Weeks in Treatment: 10 Encounter Discharge Information Items Post Procedure Vitals Discharge Condition: Stable Temperature (F): 98.6 Ambulatory Status: Wheelchair Pulse (bpm): 74 Discharge Destination: Home Respiratory Rate (breaths/min): 18 Transportation: Private Auto Blood Pressure (mmHg): 190/81 Accompanied By: daughter Schedule Follow-up Appointment: Yes Clinical Summary of Care: Patient Declined Electronic Signature(s) Signed: 05/21/2023 4:16:21 PM By: Megan Deed RN, BSN Entered By: [name] on 05/21/2023 12:55:54 [name], [name] ([id number]) [id number] Page 2 of 18 -------------------------------------------------------------------------------- Lower Extremity Assessment Details Patient Name: Date of Service: [name] 05/21/2023 2:45 PM Medical Record Number: [id number] Patient Account Number: [id number] Date of Birth/Sex: Treating RN: [birth year] (89 y.o. [name] Primary Care Shantanique Hodo: [name] Other Clinician: Referring Marquesa Rath: Treating [name]/Extender: [name] Weeks in Treatment: 10 Edema Assessment Assessed: [Left: No] [Right: No] Edema: [Left: Yes] [Right: Yes] Calf Left: Right: Point of Measurement: From Medial Instep 39 cm 36.5 cm Ankle Left: Right: Point of Measurement: From Medial Instep 24.5 cm 24 cm Vascular Assessment Pulses: Dorsalis Pedis Palpable: [Left:No] [Right:No] Extremity colors, hair growth, and conditions: Extremity Color: [Left:Hyperpigmented] [Right:Hyperpigmented] Hair Growth on Extremity: [Left:No] [Right:No] Temperature of Extremity: [Left:Warm < 3 seconds] [Right:Warm < 3 seconds] Electronic Signature(s) Signed: 05/21/2023 4:16:21 PM By: Megan Deed RN, BSN Entered By: [name] on 05/21/2023  12:07:52 -------------------------------------------------------------------------------- Multi Wound Chart Details Patient Name: Date of Service: Megan Reap. 05/21/2023 2:45 PM Medical Record Number: [id number] Patient Account Number: [id number] Date of Birth/Sex: Treating RN: June 17, 1928 (89 y.o. [name] Primary Care [name]: [name] Other Clinician: Referring [name]: Treating [name]/Extender: [name] Weeks in Treatment: 10 Vital Signs Height(in): 64 Pulse(bpm): 74 Weight(lbs): 157 Blood Pressure(mmHg): 190/81 Body Mass Index(BMI): 26.9 Temperature(F): 98.6 Respiratory Rate(breaths/min): 18 [10:Photos:] [[id number] Page 3 of 18] Left T Second oe Right T Second oe Left T Great oe Wound Location: Pressure Injury  Pressure Injury Gradually Appeared Wounding Event: Pressure Ulcer Pressure Ulcer Arterial Insufficiency Ulcer Primary Etiology: Cataracts, Anemia, Arrhythmia, Cataracts, Anemia, Arrhythmia, Cataracts, Anemia, Arrhythmia, Comorbid History: Congestive Heart Failure, Congestive Heart Failure, Congestive Heart Failure, Hypotension, Peripheral Venous Hypotension, Peripheral Venous Hypotension, Peripheral Venous Disease, Osteomyelitis, Neuropathy Disease, Osteomyelitis, Neuropathy Disease, Osteomyelitis, Neuropathy 10/09/2022 10/09/2022 03/17/2023 Date Acquired: 10 10 9  Weeks of Treatment: Open Open Open Wound Status: No No No Wound Recurrence: 0.3x0.7x0.1 0.8x0.7x0.1 0.3x0.5x0.1 Measurements L x W x D (cm) 0.165 0.44 0.118 A (cm) : rea 0.016 0.044 0.012 Volume (cm) : 76.70% 37.80% 76.20% % Reduction in A rea: 77.50% 38.00% 75.50% % Reduction in Volume: Category/Stage IV Category/Stage IV Full Thickness Without Exposed Classification: Support Structures Small Medium None Present Exudate A mount: Serosanguineous Serosanguineous N/A Exudate Type: red, brown red, brown N/A Exudate  Color: Distinct, outline attached Distinct, outline attached Flat and Intact Wound Margin: Medium (34-66%) Medium (34-66%) None Present (0%) Granulation A mount: Red Red N/A Granulation Quality: Medium (34-66%) Medium (34-66%) Large (67-100%) Necrotic A mount: Adherent Slough Adherent Slough Eschar Necrotic Tissue: Fat Layer (Subcutaneous Tissue): Yes Fat Layer (Subcutaneous Tissue): Yes Fat Layer (Subcutaneous Tissue): Yes Exposed Structures: Joint: Yes Joint: Yes Fascia: No Bone: Yes Bone: Yes Tendon: No Fascia: No Fascia: No Muscle: No Tendon: No Tendon: No Joint: No Muscle: No Muscle: No Bone: No Small (1-33%) None Large (67-100%) Epithelialization: Debridement - Selective/Open Wound Debridement - Selective/Open Wound Debridement - Selective/Open Wound Debridement: Pre-procedure Verification/Time Out 15:25 15:25 15:25 Taken: Lidocaine 4% Topical Solution Lidocaine 4% Topical Solution Lidocaine 4% Topical Solution Pain Control: Brewing technologist, [employer] Tissue Debrided: Non-Viable Tissue Non-Viable Tissue Non-Viable Tissue Level: 0.16 0.44 0.12 Debridement A (sq cm): rea Curette Curette Curette Instrument: Minimum Minimum Minimum Bleeding: Pressure Pressure Pressure Hemostasis A chieved: 2 2 2  Procedural Pain: 0 0 0 Post Procedural Pain: Procedure was tolerated well Procedure was tolerated well Procedure was tolerated well Debridement Treatment Response: 0.3x0.7x0.1 0.8x0.7x0.1 0.3x0.5x0.1 Post Debridement Measurements L x W x D (cm) 0.016 0.044 0.012 Post Debridement Volume: (cm) Category/Stage IV Category/Stage IV N/A Post Debridement Stage: No Abnormalities Noted No Abnormalities Noted No Abnormalities Noted Periwound Skin Texture: Dry/Scaly: Yes Dry/Scaly: Yes No Abnormalities Noted Periwound Skin Moisture: No Abnormalities Noted Rubor: No Erythema: No Periwound Skin Color: No Abnormality No Abnormality No  Abnormality Temperature: N/A Yes Yes Tenderness on Palpation: Debridement Debridement Debridement Procedures Performed: Wound Number: [id number]  Photos: Left, Dorsal T Great oe Right, Dorsal T Great oe Left Calcaneus Wound Location: Gradually Appeared Gradually Appeared Pressure Injury Wounding 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094 0.33 A (cm) : rea 0.118 0.009 0.066 Volume (cm) : JANIENE, PEINE ([id number]) [id number] Page 4 of 18 -1391.10% -32.40% 0.00% % Reduction in Area: -1375.00% -28.60% 33.30% % Reduction in Volume: Full Thickness With Exposed Support Full Thickness Without Exposed Category/Stage II Classification: Structures Support Structures Medium Medium Small Exudate A mount: Serosanguineous Serosanguineous Serous Exudate Type: red, brown red, brown amber Exudate Color: Distinct, outline attached Flat and Intact Distinct, outline attached Wound Margin: Medium (34-66%)  Large (67-100%) None Present (0%) Granulation A mount: Red Red N/A Granulation Quality: Medium (34-66%) Small (1-33%) Large (67-100%) Necrotic A mount: Adherent Slough Adherent [employer], Adherent Slough Necrotic Tissue: Fat Layer (Subcutaneous Tissue): Yes Fat Layer (Subcutaneous Tissue): Yes Fat Layer (Subcutaneous  Tissue): Yes Exposed Structures: Tendon: Yes Fascia: No Fascia: No Bone: Yes Tendon: No Tendon: No Muscle: No Muscle: No Joint: No Joint: No Bone: No Bone: No Small (1-33%) Small (1-33%) Small (1-33%) Epithelialization: Debridement - Selective/Open Wound Debridement - Selective/Open Wound Debridement - Excisional Debridement: Pre-procedure Verification/Time Out 15:25 15:25 15:25 Taken: Lidocaine 4% Topical Solution Lidocaine 4% Topical Solution Lidocaine 4% Topical Solution Pain Control: Ambulance person, [employer], Slough Tissue Debrided: Non-Viable Tissue Non-Viable Tissue Skin/Subcutaneous Tissue Level: 1.18 0.09 0.33 Debridement A (sq cm): rea Curette, Forceps, Scissors Curette Curette Instrument: Minimum Minimum Minimum Bleeding: Pressure Pressure Pressure Hemostasis A chieved: 2 2 2  Procedural Pain: 0 0 0 Post Procedural Pain: Procedure was tolerated well Procedure was tolerated well Procedure was tolerated well Debridement Treatment Response: 1x1.5x0.1 0.4x0.3x0.1 0.7x0.6x0.1 Post Debridement Measurements L x W x D (cm) 0.118 0.009 0.033 Post Debridement Volume: (cm) N/A N/A Category/Stage II Post Debridement Stage: No Abnormalities Noted No Abnormalities Noted Callus: No Periwound Skin Texture: No Abnormalities Noted No Abnormalities Noted Dry/Scaly: Yes Periwound Skin Moisture: Erythema: No No Abnormalities Noted No Abnormalities Noted Periwound Skin Color: No Abnormality No Abnormality No Abnormality Temperature: Yes Yes Yes Tenderness on Palpation: Debridement Debridement Debridement Procedures Performed: Treatment Notes Wound #10 (Toe Second) Wound Laterality: Left Cleanser Soap and Water Discharge Instruction: May shower and wash wound with dial antibacterial soap and water prior to dressing change. Wound Cleanser Discharge Instruction: Cleanse the wound with wound cleanser prior to applying a clean dressing using gauze  sponges, not tissue or cotton balls. Peri-Wound Care Topical Primary Dressing Promogran Prisma Matrix, 4.34 (sq in) (silver collagen) Discharge Instruction: Moisten collagen with saline or hydrogel Secondary Dressing Woven Gauze Sponge, Non-Sterile 4x4 in Discharge Instruction: Apply over primary dressing as directed. Secured With 28M Medipore H Soft Cloth Surgical T ape, 4 x 10 (in/yd) Discharge Instruction: Secure with tape as directed. Compression Wrap Compression Stockings Add-Ons Wound #11 (Toe Second) Wound Laterality: Right Cleanser Soap and Water Discharge Instruction: May shower and wash wound with dial antibacterial soap and water prior to dressing change. [name], [name] ([id number]) [id number] Page 5 of 18 Wound Cleanser Discharge Instruction: Cleanse the wound with wound cleanser prior to applying a clean dressing using gauze sponges, not tissue or cotton balls. Peri-Wound Care Topical Primary Dressing Promogran Prisma Matrix, 4.34 (sq in) (silver collagen) Discharge Instruction: Moisten collagen with saline or hydrogel Secondary Dressing Woven Gauze Sponge, Non-Sterile 4x4 in Discharge Instruction: Apply over primary dressing as directed. Secured With 28M Medipore H Soft Cloth Surgical T ape, 4 x 10 (in/yd) Discharge Instruction: Secure with tape as directed. Compression Wrap Compression Stockings Add-Ons Wound #12 (Toe Great) Wound Laterality: Left Cleanser Soap and Water Discharge Instruction: May shower and wash wound with dial antibacterial soap and water prior to dressing change. Wound Cleanser Discharge Instruction: Cleanse the wound with wound cleanser prior to applying a clean dressing using gauze sponges, not tissue or cotton balls. Peri-Wound Care Topical Primary Dressing Promogran Prisma Matrix, 4.34 (sq in) (silver collagen) Discharge Instruction: Moisten collagen with saline or hydrogel Secondary Dressing Woven Gauze  Sponge, Non-Sterile 4x4 in Discharge Instruction: Apply over primary dressing as directed. Secured With 28M Medipore H Soft Cloth Surgical T ape, 4 x 10 (in/yd)  Discharge Instruction: Secure with tape as directed. Compression Wrap Compression Stockings Add-Ons Wound #13 (Toe Great) Wound Laterality: Dorsal, Left Cleanser Soap and Water Discharge Instruction: May shower and wash wound with dial antibacterial soap and water prior to dressing change. Wound Cleanser Discharge Instruction: Cleanse the wound with wound cleanser prior to applying a clean dressing using gauze sponges, not tissue or cotton balls. Peri-Wound Care Topical Primary Dressing Promogran Prisma Matrix, 4.34 (sq in) (silver collagen) Discharge Instruction: Moisten collagen with saline or hydrogel Secondary Dressing Woven Gauze Sponge, Non-Sterile 4x4 in Discharge Instruction: Apply over primary dressing as directed. Secured With AMANDAH, KOORS ([id number]) [id number] Page 6 of 18 58M Medipore H Soft Cloth Surgical T ape, 4 x 10 (in/yd) Discharge Instruction: Secure with tape as directed. Compression Wrap Compression Stockings Add-Ons Wound #15 (Toe Great) Wound Laterality: Dorsal, Right Cleanser Soap and Water Discharge Instruction: May shower and wash wound with dial antibacterial soap and water prior to dressing change. Wound Cleanser Discharge Instruction: Cleanse the wound with wound cleanser prior to applying a clean dressing using gauze sponges, not tissue or cotton balls. Peri-Wound Care Topical Primary Dressing Promogran Prisma Matrix, 4.34 (sq in) (silver collagen) Discharge Instruction: Moisten collagen with saline or hydrogel Secondary Dressing Woven Gauze Sponge, Non-Sterile 4x4 in Discharge Instruction: Apply over primary dressing as directed. Secured With 58M Medipore H Soft Cloth Surgical T ape, 4 x 10 (in/yd) Discharge Instruction: Secure with tape as  directed. Compression Wrap Compression Stockings Add-Ons Wound #9 (Calcaneus) Wound Laterality: Left Cleanser Soap and Water Discharge Instruction: May shower and wash wound with dial antibacterial soap and water prior to dressing change. Wound Cleanser Discharge Instruction: Cleanse the wound with wound cleanser prior to applying a clean dressing using gauze sponges, not tissue or cotton balls. Peri-Wound Care Topical Primary Dressing Promogran Prisma Matrix, 4.34 (sq in) (silver collagen) Discharge Instruction: Moisten collagen with saline or hydrogel Secondary Dressing ALLEVYN Heel 4 1/2in x 5 1/2in / 10.5cm x 13.5cm Discharge Instruction: Apply over primary dressing as directed. Woven Gauze Sponge, Non-Sterile 4x4 in Discharge Instruction: Apply over primary dressing as directed. Secured With 58M Medipore H Soft Cloth Surgical T ape, 4 x 10 (in/yd) Discharge Instruction: Secure with tape as directed. Compression Wrap Compression Stockings Add-Ons Electronic Signature(s) Signed: 05/21/2023 4:14:44 PM By: Duanne Guess MD FACS Signed: 05/21/2023 4:16:21 PM By: Megan Deed RN, BSN Borboa, [name] ([id number]) [id number] Page 7 of 18 Entered By: Duanne Guess on 05/21/2023 13:14:43 -------------------------------------------------------------------------------- Multi-Disciplinary Care Plan Details Patient Name: Date of Service: JACQUEL, OVERHOLT 05/21/2023 2:45 PM Medical Record Number: [id number] Patient Account Number: [id number] Date of Birth/Sex: Treating RN: 10/21/1927 (89 y.o. [name] Primary Care Kailee Essman: [name] Other Clinician: Referring Ardyn Forge: Treating Laelle Bridgett/Extender: Tiajuana Amass in Treatment: 10 Multidisciplinary Care Plan reviewed with physician Active Inactive Abuse / Safety / Falls / Self Care Management Nursing Diagnoses: Impaired home maintenance Impaired physical  mobility Potential for falls Goals: Patient/caregiver will verbalize/demonstrate measure taken to improve self care Date Initiated: 03/10/2023 Target Resolution Date: 06/12/2023 Goal Status: Active Patient/caregiver will verbalize/demonstrate measures taken to improve the patient's personal safety Date Initiated: 03/10/2023 Target Resolution Date: 06/12/2023 Goal Status: Active Interventions: Assess fall risk on admission and as needed Assess: immobility, friction, shearing, incontinence upon admission and as needed Provide education on fall prevention Notes: Wound/Skin Impairment Nursing Diagnoses: Impaired tissue integrity Knowledge deficit related to ulceration/compromised skin integrity Goals: Patient/caregiver will verbalize understanding of skin care regimen Date Initiated: 03/10/2023 Target  Resolution Date: 06/12/2023 Goal Status: Active Interventions: Assess patient/caregiver ability to obtain necessary supplies Assess patient/caregiver ability to perform ulcer/skin care regimen upon admission and as needed Assess ulceration(s) every visit Treatment Activities: Skin care regimen initiated : 03/10/2023 Topical wound management initiated : 03/10/2023 Notes: Electronic Signature(s) Signed: 05/21/2023 4:16:21 PM By: Megan Deed RN, BSN Entered By: [name] on 05/21/2023 12:22:19 [name], [name] ([id number]) [id number] Page 8 of 18 -------------------------------------------------------------------------------- Pain Assessment Details Patient Name: Date of Service: KRISSIA, RALSTON 05/21/2023 2:45 PM Medical Record Number: [id number] Patient Account Number: [id number] Date of Birth/Sex: Treating RN: [birth year] (89 y.o. [name] Primary Care Samanthamarie Ezzell: [name] Other Clinician: Referring Hitesh Fouche: Treating Shivani Barrantes/Extender: [name] Weeks in Treatment: 10 Active Problems Location of Pain Severity and  Description of Pain Patient Has Paino No Site Locations Rate the pain. Current Pain Level: 0 Pain Management and Medication Current Pain Management: Electronic Signature(s) Signed: 05/21/2023 4:16:21 PM By: Megan Deed RN, BSN Previous Signature: 05/21/2023 2:44:53 PM Version By: [name] Entered By: [name] on 05/21/2023 12:04:19 -------------------------------------------------------------------------------- Patient/Caregiver Education Details Patient Name: Date of Service: Megan Reap 9/12/2024andnbsp2:45 PM Medical Record Number: [id number] Patient Account Number: [id number] Date of Birth/Gender: Treating RN: [birth year] (89 y.o. [name] Primary Care Physician: [name] Other Clinician: Referring Physician: Treating Physician/Extender: Tiajuana Amass in Treatment: 10 Education Assessment Education Provided To: Patient Education Topics Provided Tissue Oxygenation: Methods: Explain/Verbal Responses: Reinforcements needed, State content correctly [name], [name] ([id number]) [id number] Page 9 of 18 Wound/Skin Impairment: Methods: Explain/Verbal Responses: Reinforcements needed, State content correctly Electronic Signature(s) Signed: 05/21/2023 4:16:21 PM By: Megan Deed RN, BSN Entered By: [name] on 05/21/2023 12:23:23 -------------------------------------------------------------------------------- Wound Assessment Details Patient Name: Date of Service: Megan Reap. 05/21/2023 2:45 PM Medical Record Number: [id number] Patient Account Number: [id number] Date of Birth/Sex: Treating RN: [birth year] (89 y.o. [name], Megan Primary Care Jendaya Gossett: [name] Other Clinician: Referring Aleem Elza: Treating Marvelous Woolford/Extender: [name] Weeks in Treatment: 10 Wound Status Wound Number: 10 Primary Pressure Ulcer Etiology: Wound Location: Left T Second oe Wound  Open Wounding Event: Pressure Injury Status: Date Acquired: 10/09/2022 Comorbid Cataracts, Anemia, Arrhythmia, Congestive Heart Failure, Weeks Of Treatment: 10 History: Hypotension, Peripheral Venous Disease, Osteomyelitis, Clustered Wound: No Neuropathy Photos Wound Measurements Length: (cm) 0.3 Width: (cm) 0.7 Depth: (cm) 0.1 Area: (cm) 0.165 Volume: (cm) 0.016 % Reduction in Area: 76.7% % Reduction in Volume: 77.5% Epithelialization: Small (1-33%) Tunneling: No Undermining: No Wound Description Classification: Category/Stage IV Wound Margin: Distinct, outline attached Exudate Amount: Small Exudate Type: Serosanguineous Exudate Color: red, brown Foul Odor After Cleansing: No Slough/Fibrino No Wound Bed Granulation Amount: Medium (34-66%) Exposed Structure Granulation Quality: Red Fascia Exposed: No Necrotic Amount: Medium (34-66%) Fat Layer (Subcutaneous Tissue) Exposed: Yes Necrotic Quality: Adherent Slough Tendon Exposed: No Muscle Exposed: No Joint Exposed: Yes Bone Exposed: Yes Periwound Skin Texture [name], [name] ([id number]) [id number] Page 10 of 18 Texture Color No Abnormalities Noted: Yes No Abnormalities Noted: Yes Moisture Temperature / Pain No Abnormalities Noted: Yes Temperature: No Abnormality Treatment Notes Wound #10 (Toe Second) Wound Laterality: Left Cleanser Soap and Water Discharge Instruction: May shower and wash wound with dial antibacterial soap and water prior to dressing change. Wound Cleanser Discharge Instruction: Cleanse the wound with wound cleanser prior to applying a clean dressing using gauze sponges, not tissue or cotton balls. Peri-Wound Care Topical Primary Dressing Promogran Prisma Matrix, 4.34 (sq in) (silver collagen) Discharge Instruction: Moisten  collagen with saline or hydrogel Secondary Dressing Woven Gauze Sponge, Non-Sterile 4x4 in Discharge Instruction: Apply over primary dressing as  directed. Secured With 64M Medipore H Soft Cloth Surgical T ape, 4 x 10 (in/yd) Discharge Instruction: Secure with tape as directed. Compression Wrap Compression Stockings Add-Ons Electronic Signature(s) Signed: 05/21/2023 4:16:21 PM By: Megan Deed RN, BSN Entered By: [name] on 05/21/2023 12:10:45 -------------------------------------------------------------------------------- Wound Assessment Details Patient Name: Date of Service: Megan Reap. 05/21/2023 2:45 PM Medical Record Number: [id number] Patient Account Number: [id number] Date of Birth/Sex: Treating RN: 12-26-1927 (89 y.o. [name] Primary Care Tagan Bartram: [name] Other Clinician: Referring Ardella Chhim: Treating Kemberly Taves/Extender: [name] Weeks in Treatment: 10 Wound Status Wound Number: 11 Primary Pressure Ulcer Etiology: Wound Location: Right T Second oe Wound Open Wounding Event: Pressure Injury Status: Date Acquired: 10/09/2022 Comorbid Cataracts, Anemia, Arrhythmia, Congestive Heart Failure, Weeks Of Treatment: 10 History: Hypotension, Peripheral Venous Disease, Osteomyelitis, Clustered Wound: No Neuropathy Photos Smiles, [name] ([id number]) [id number] Page 11 of 18 Wound Measurements Length: (cm) 0.8 Width: (cm) 0.7 Depth: (cm) 0.1 Area: (cm) 0.44 Volume: (cm) 0.044 % Reduction in Area: 37.8% % Reduction in Volume: 38% Epithelialization: None Tunneling: No Undermining: No Wound Description Classification: Category/Stage IV Wound Margin: Distinct, outline attached Exudate Amount: Medium Exudate Type: Serosanguineous Exudate Color: red, brown Foul Odor After Cleansing: No Slough/Fibrino Yes Wound Bed Granulation Amount: Medium (34-66%) Exposed Structure Granulation Quality: Red Fascia Exposed: No Necrotic Amount: Medium (34-66%) Fat Layer (Subcutaneous Tissue) Exposed: Yes Necrotic Quality: Adherent Slough Tendon Exposed:  No Muscle Exposed: No Joint Exposed: Yes Bone Exposed: Yes Periwound Skin Texture Texture Color No Abnormalities Noted: Yes No Abnormalities Noted: Yes Moisture Temperature / Pain No Abnormalities Noted: Yes Temperature: No Abnormality Tenderness on Palpation: Yes Treatment Notes Wound #11 (Toe Second) Wound Laterality: Right Cleanser Soap and Water Discharge Instruction: May shower and wash wound with dial antibacterial soap and water prior to dressing change. Wound Cleanser Discharge Instruction: Cleanse the wound with wound cleanser prior to applying a clean dressing using gauze sponges, not tissue or cotton balls. Peri-Wound Care Topical Primary Dressing Promogran Prisma Matrix, 4.34 (sq in) (silver collagen) Discharge Instruction: Moisten collagen with saline or hydrogel Secondary Dressing Woven Gauze Sponge, Non-Sterile 4x4 in Discharge Instruction: Apply over primary dressing as directed. Secured With 64M Medipore H Soft Cloth Surgical T ape, 4 x 10 (in/yd) Discharge Instruction: Secure with tape as directed. Compression Wrap Compression Stockings Add-Ons [name], [name] ([id number]) [id number] Page 12 of 18 Electronic Signature(s) Signed: 05/21/2023 4:16:21 PM By: Megan Deed RN, BSN Entered By: [name] on 05/21/2023 12:11:34 -------------------------------------------------------------------------------- Wound Assessment Details Patient Name: Date of Service: Megan Reap. 05/21/2023 2:45 PM Medical Record Number: [id number] Patient Account Number: [id number] Date of Birth/Sex: Treating RN: [birth year] (89 y.o. 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 Wound Open Wounding Event:  Gradually Appeared Status: Date Acquired: 03/17/2023 Comorbid Cataracts, Anemia, Arrhythmia, Congestive Heart Failure, Weeks Of Treatment: 9 History: Hypotension, Peripheral Venous Disease, Osteomyelitis, Clustered Wound: No Neuropathy Photos Wound Measurements Length: (cm) 0.3 Width: (cm) 0.5 Depth: (cm) 0.1 Area: (cm) 0.118 Volume: (cm) 0.012 % Reduction in Area: 76.2% % Reduction in Volume: 75.5% Epithelialization: Large (67-100%) Tunneling: No Undermining: No Wound Description Classification: Full Thickness Without Exposed Suppo Wound Margin: Flat and Intact Exudate  Amount: None Present rt Structures Foul Odor After Cleansing: No Slough/Fibrino No Wound Bed Granulation Amount: None Present (0%) Exposed Structure Necrotic Amount: Large (67-100%) Fascia Exposed: No Necrotic Quality: Eschar Fat Layer (Subcutaneous Tissue) Exposed: Yes Tendon Exposed: No Muscle Exposed: No Joint Exposed: No Bone Exposed: No Periwound Skin Texture Texture Color No Abnormalities Noted: Yes No Abnormalities Noted: Yes Moisture Temperature / Pain No Abnormalities Noted: Yes Temperature: No Abnormality Tenderness on Palpation: Yes Treatment Notes [name], [name] ([id number]) [id number] Page 13 of 18 Wound #12 (Toe Great) Wound Laterality: Left Cleanser Soap and Water Discharge Instruction: May shower and wash wound with dial antibacterial soap and water prior to dressing change. Wound Cleanser Discharge Instruction: Cleanse the wound with wound cleanser prior to applying a clean dressing using gauze sponges, not tissue or cotton balls. Peri-Wound Care Topical Primary Dressing Promogran Prisma Matrix, 4.34 (sq in) (silver collagen) Discharge Instruction: Moisten collagen with saline or hydrogel Secondary Dressing Woven Gauze Sponge, Non-Sterile 4x4 in Discharge Instruction: Apply over primary dressing as directed. Secured With 50M Medipore H Soft Cloth  Surgical T ape, 4 x 10 (in/yd) Discharge Instruction: Secure with tape as directed. Compression Wrap Compression Stockings Add-Ons Electronic Signature(s) Signed: 05/21/2023 4:16:21 PM By: Megan Deed RN, BSN Entered By: [name] on 05/21/2023 12:19:00 -------------------------------------------------------------------------------- Wound Assessment Details Patient Name: Date of Service: Megan Reap. 05/21/2023 2:45 PM Medical Record Number: [id number] Patient Account Number: [id number] Date of Birth/Sex: Treating RN: 10-Oct-1927 (89 y.o. 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, Dorsal T Great oe Wound Open Wounding Event: Gradually Appeared Status: Date Acquired: 03/24/2023 Comorbid Cataracts, Anemia, Arrhythmia, Congestive Heart Failure, Weeks Of Treatment: 8 History: Hypotension, Peripheral Venous Disease, Osteomyelitis, Clustered Wound: No Neuropathy Photos Wound Measurements [name], [name] ([id number]) Length: (cm) 1 Width: (cm) 1.5 Depth: (cm) 0.1 Area: (cm) 1.178 Volume: (cm) 0.118 [id number] Page 14 of 18 % Reduction in Area: -1391.1% % Reduction in Volume: -1375% Epithelialization: Small (1-33%) Tunneling: No Undermining: No Wound Description Classification: Full Thickness With Exposed Supp Wound Margin: Distinct, outline attached Exudate Amount: Medium Exudate Type: Serosanguineous Exudate Color: red, brown ort Structures Foul Odor After Cleansing: No Slough/Fibrino Yes Wound Bed Granulation Amount: Medium (34-66%) Exposed Structure Granulation Quality: Red Fat Layer (Subcutaneous Tissue) Exposed: Yes Necrotic Amount: Medium (34-66%) Tendon Exposed: Yes Necrotic Quality: Adherent  Slough Bone Exposed: Yes Periwound Skin Texture Texture Color No Abnormalities Noted: Yes No Abnormalities Noted: Yes Moisture Temperature / Pain No Abnormalities Noted: Yes Temperature: No Abnormality Tenderness on Palpation: Yes Treatment Notes Wound #13 (Toe Great) Wound Laterality: Dorsal, Left Cleanser Soap and Water Discharge Instruction: May shower and wash wound with dial antibacterial soap and water prior to dressing change. Wound Cleanser Discharge Instruction: Cleanse the wound with wound cleanser prior to applying a clean dressing using gauze sponges, not tissue or cotton balls. Peri-Wound Care Topical Primary Dressing Promogran Prisma Matrix, 4.34 (sq in) (silver collagen) Discharge Instruction: Moisten collagen with saline or hydrogel Secondary Dressing Woven Gauze Sponge, Non-Sterile 4x4 in Discharge Instruction: Apply over primary dressing as directed. Secured With 50M Medipore H Soft Cloth Surgical T ape, 4 x 10 (in/yd) Discharge Instruction: Secure with tape as directed. Compression Wrap Compression Stockings Add-Ons Electronic Signature(s) Signed: 05/21/2023 4:16:21 PM By: Megan Deed RN, BSN Entered By: [name] on 05/21/2023 12:29:04 -------------------------------------------------------------------------------- Wound Assessment Details  Patient Name: Date of Service: [name], [name] 05/21/2023 2:45 PM Medical Record Number: [id number] Patient Account Number: [id number] Date of Birth/Sex: Treating RN: [birth year] (89 y.o. [address] [place], [place] Judie [name] ([id number]) [id number] Page 15 of 18 Primary Care Taiten Brawn: [name] Other Clinician: Referring Avish Torry: Treating Nykole Matos/Extender: [name] Weeks in Treatment: 10 Wound Status Wound Number: 15 Primary Arterial Insufficiency Ulcer Etiology: Wound Location: Right, Dorsal T Great oe Wound Open Wounding Event: Gradually  Appeared Status: Date Acquired: 03/26/2023 Comorbid Cataracts, Anemia, Arrhythmia, Congestive Heart Failure, Weeks Of Treatment: 8 History: Hypotension, Peripheral Venous Disease, Osteomyelitis, Clustered Wound: No Neuropathy Photos Wound Measurements Length: (cm) 0.4 Width: (cm) 0.3 Depth: (cm) 0.1 Area: (cm) 0.094 Volume: (cm) 0.009 % Reduction in Area: -32.4% % Reduction in Volume: -28.6% Epithelialization: Small (1-33%) Tunneling: No Undermining: No Wound Description Classification: Full Thickness Without Exposed Support Structures Wound Margin: Flat and Intact Exudate Amount: Medium Exudate Type: Serosanguineous Exudate Color: red, brown Foul Odor After Cleansing: No Slough/Fibrino No Wound Bed Granulation Amount: Large (67-100%) Exposed Structure Granulation Quality: Red Fascia Exposed: No Necrotic Amount: Small (1-33%) Fat Layer (Subcutaneous Tissue) Exposed: Yes Necrotic Quality: Adherent Slough Tendon Exposed: No Muscle Exposed: No Joint Exposed: No Bone Exposed: No Periwound Skin Texture Texture Color No Abnormalities Noted: Yes No Abnormalities Noted: Yes Moisture Temperature / Pain No Abnormalities Noted: Yes Temperature: No Abnormality Tenderness on Palpation: Yes Treatment Notes Wound #15 (Toe Great) Wound Laterality: Dorsal, Right Cleanser Soap and Water Discharge Instruction: May shower and wash wound with dial antibacterial soap and water prior to dressing change. Wound Cleanser Discharge Instruction: Cleanse the wound with wound cleanser prior to applying a clean dressing using gauze sponges, not tissue or cotton balls. Peri-Wound Care Topical Primary Dressing Promogran Prisma Matrix, 4.34 (sq in) (silver collagen) Discharge Instruction: Moisten collagen with saline or hydrogel [name], [name] [name] ([id number]) [id number] Page 16 of 18 Secondary Dressing Woven Gauze Sponge, Non-Sterile 4x4 in Discharge Instruction:  Apply over primary dressing as directed. Secured With 44M Medipore H Soft Cloth Surgical T ape, 4 x 10 (in/yd) Discharge Instruction: Secure with tape as directed. Compression Wrap Compression Stockings Add-Ons Electronic Signature(s) Signed: 05/21/2023 4:16:21 PM By: Megan Deed RN, BSN Entered By: [name] on 05/21/2023 12:14:37 -------------------------------------------------------------------------------- Wound Assessment Details Patient Name: Date of Service: Megan Reap. 05/21/2023 2:45 PM Medical Record Number: [id number] Patient Account Number: [id number] Date of Birth/Sex: Treating RN: 1928-01-22 (89 y.o. [name], Megan Primary Care Arlynn Mcdermid: [name] Other Clinician: Referring Elga Santy: Treating Lena Gores/Extender: [name] Weeks in Treatment: 10 Wound Status Wound Number: 9 Primary Pressure Ulcer Etiology: Wound Location: Left Calcaneus Wound Open Wounding Event: Pressure Injury Status: Date Acquired: 10/22/2022 Comorbid Cataracts, Anemia, Arrhythmia, Congestive Heart Failure, Weeks Of Treatment: 10 History: Hypotension, Peripheral Venous Disease, Osteomyelitis, Clustered Wound: No Neuropathy Photos Wound Measurements Length: (cm) 0.7 Width: (cm) 0.6 Depth: (cm) 0.2 Area: (cm) 0.33 Volume: (cm) 0.066 % Reduction in Area: 0% % Reduction in Volume: 33.3% Epithelialization: Small (1-33%) Tunneling: No Undermining: No Wound Description Classification: Category/Stage II Wound Margin: Distinct, outline attached Exudate Amount: Small Exudate Type: Serous Exudate Color: amber Foul Odor After Cleansing: No Slough/Fibrino Yes Wound Bed Granulation Amount: None Present (0%) Exposed Structure Dejoy, Tobey [name] ([id number]) [id number] Page 17 of 18 Necrotic Amount: Large (67-100%) Fascia Exposed: No Necrotic Quality: Eschar, Adherent Slough Fat Layer (Subcutaneous Tissue) Exposed: Yes Tendon  Exposed: No Muscle Exposed: No Joint Exposed: No Bone Exposed: No Periwound Skin Texture Texture  Color No Abnormalities Noted: Yes No Abnormalities Noted: Yes Moisture Temperature / Pain No Abnormalities Noted: Yes Temperature: No Abnormality Tenderness on Palpation: Yes Treatment Notes Wound #9 (Calcaneus) Wound Laterality: Left Cleanser Soap and Water Discharge Instruction: May shower and wash wound with dial antibacterial soap and water prior to dressing change. Wound Cleanser Discharge Instruction: Cleanse the wound with wound cleanser prior to applying a clean dressing using gauze sponges, not tissue or cotton balls. Peri-Wound Care Topical Primary Dressing Promogran Prisma Matrix, 4.34 (sq in) (silver collagen) Discharge Instruction: Moisten collagen with saline or hydrogel Secondary Dressing ALLEVYN Heel 4 1/2in x 5 1/2in / 10.5cm x 13.5cm Discharge Instruction: Apply over primary dressing as directed. Woven Gauze Sponge, Non-Sterile 4x4 in Discharge Instruction: Apply over primary dressing as directed. Secured With 61M Medipore H Soft Cloth Surgical T ape, 4 x 10 (in/yd) Discharge Instruction: Secure with tape as directed. Compression Wrap Compression Stockings Add-Ons Electronic Signature(s) Signed: 05/21/2023 4:16:21 PM By: Megan Deed RN, BSN Entered By: [name] on 05/21/2023 12:15:09 -------------------------------------------------------------------------------- Vitals Details Patient Name: Date of Service: Megan Reap. 05/21/2023 2:45 PM Medical Record Number: [id number] Patient Account Number: [id number] Date of Birth/Sex: Treating RN: [birth year] (89 y.o. [name] Primary Care Andren Bethea: [name] Other Clinician: Referring Jonael Paradiso: Treating Reata Petrov/Extender: [name] Weeks in Treatment: 10 Vital Signs Time Taken: 02:43 Temperature (F): 98.6 Height (in): 64 Pulse (bpm): 74 Weight (lbs):  157 Respiratory Rate (breaths/min): 18 Body Mass Index (BMI): 26.9 Blood Pressure (mmHg): 190/81 Kempner, Jeroline [name] ([id number]) [id number] Page 18 of 18 Reference Range: 80 - 120 mg / dl Electronic Signature(s) Signed: 05/21/2023 4:16:21 PM By: Megan Deed RN, BSN Previous Signature: 05/21/2023 2:44:53 PM Version By: [name] Entered By: [name] on 05/21/2023 12:04:09

## 2023-05-22 ENCOUNTER — Ambulatory Visit: Payer: Medicare HMO

## 2023-05-22 ENCOUNTER — Ambulatory Visit: Payer: Medicare HMO | Admitting: Podiatry

## 2023-05-22 NOTE — Progress Notes (Signed)
Bed Debrided: 100% T Area Debrided (cm): otal 1.18 Tissue and other material debrided: Non-Viable, Slough, T endon, Slough Level: Skin/Subcutaneous Tissue/Muscle Debridement Description: Excisional Instrument: Curette, Forceps, Scissors Bleeding: Minimum Hemostasis Achieved: Pressure Procedural  Pain: 2 Post Procedural Pain: 0 Response to Treatment: Procedure was tolerated well Level of Consciousness (Post- Awake and Alert procedure): [name], [name] ([id number]) [id number] Page 5 of 18 Post Debridement Measurements of Total Wound Length: (cm) 1 Width: (cm) 1.5 Depth: (cm) 0.1 Volume: (cm) 0.118 Character of Wound/Ulcer Post Debridement: Stable Severity of Tissue Post Debridement: Necrosis of bone Post Procedure Diagnosis Same as Pre-procedure Electronic Signature(s) Signed: 05/21/2023 4:22:27 PM By: Duanne Guess MD FACS Signed: 05/22/2023 11:43:52 AM By: Zenaida Deed RN, BSN Previous Signature: 05/21/2023 4:16:21 PM Version By: Zenaida Deed RN, BSN Entered By: Duanne Guess on 05/21/2023 16:22:27 -------------------------------------------------------------------------------- Debridement Details Patient Name: Date of Service: [name]. 05/21/2023 2:45 PM Medical Record Number: [id number] Patient Account Number: [id number] Date of Birth/Sex: Treating RN: 1928/07/17 (89 y.o. [name] Primary Care Provider: [name] Other Clinician: Referring Provider: Treating Provider/Extender: Claudie Leach Weeks in Treatment: 10 Debridement Performed for Assessment: Wound #9 Left Calcaneus Performed By: Physician Duanne Guess, MD The following information was scribed by: Zenaida Deed The information was scribed for: Duanne Guess Debridement Type: Debridement Level of Consciousness (Pre-procedure): Awake and Alert Pre-procedure Verification/Time Out Yes - 15:25 Taken: Start Time: 15:26 Pain Control: Lidocaine 4% T opical Solution Percent of Wound Bed Debrided: 100% T Area Debrided (cm): otal 0.33 Tissue and other material debrided: Non-Viable, Slough, Subcutaneous, Slough Level: Skin/Subcutaneous Tissue Debridement Description: Excisional Instrument: Curette Bleeding: Minimum Hemostasis  Achieved: Pressure Procedural Pain: 2 Post Procedural Pain: 0 Response to Treatment: Procedure was tolerated well Level of Consciousness (Post- Awake and Alert procedure): Post Debridement Measurements of Total Wound Length: (cm) 0.7 Stage: Category/Stage II Width: (cm) 0.6 Depth: (cm) 0.1 Volume: (cm) 0.033 Character of Wound/Ulcer Post Debridement: Stable Post Procedure Diagnosis Same as Pre-procedure Electronic Signature(s) Signed: 05/21/2023 4:22:43 PM By: Duanne Guess MD FACS Signed: 05/22/2023 11:43:52 AM By: Zenaida Deed RN, BSN Previous Signature: 05/21/2023 4:16:21 PM Version By: Zenaida Deed RN, BSN Entered By: Duanne Guess on 05/21/2023 16:22:43 [name], [name] ([id number]) [id number] Page 6 of 18 -------------------------------------------------------------------------------- HPI Details Patient Name: Date of Service: [name], [name] 05/21/2023 2:45 PM Medical Record Number: [id number] Patient Account Number: [id number] Date of Birth/Sex: Treating RN: 09/01/1928 (89 y.o. [name] Primary Care Provider: [name] Other Clinician: Referring Provider: Treating Provider/Extender: Claudie Leach Weeks in Treatment: 10 History of Present Illness HPI Description: Admission 01/28/2022 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 She states she has tried Medihoney and silver alginate in the past to this area with little benefit. She also has 2 small areas limited to skin breakdown to her lower extremities bilaterally. She has chronic venous insufficiency but not has not been wearing her compression stockings. She states she bumped  her legs against an object and not so the wound started. She has been using Medihoney to the sites. She denies signs of infection. 6/2; patient presents for follow-up. She had an x-ray of her right foot done at last clinic visit and this was  Debridement Details Patient Name: Date of Service: [name], [name] 05/21/2023 2:45 PM Medical Record Number: [id number] Patient Account Number: [id number] Date of Birth/Sex: Treating RN: 06/18/1928 (89 y.o. [name] Primary Care Provider: [name] Other Clinician: Referring Provider: Treating Provider/Extender: Claudie Leach Weeks in Treatment: 10 Debridement Performed for Assessment: Wound #11 Right T Second oe Performed By: Physician Duanne Guess, MD The following information was scribed by: Zenaida Deed The information was scribed for: Duanne Guess Debridement Type: Debridement Level of Consciousness (Pre-procedure): Awake and Alert Pre-procedure Verification/Time Out Yes - 15:25 Taken: Start Time: 15:26 Pain Control: Lidocaine 4% T opical Solution Percent of Wound Bed Debrided: 100% T Area Debrided (cm): otal 0.44 Tissue and other material debrided: Non-Viable, Slough, Slough Level: Non-Viable Tissue Debridement Description: Selective/Open Wound Instrument: Curette Bleeding:  Minimum Hemostasis Achieved: Pressure Procedural Pain: 2 Post Procedural Pain: 0 Response to Treatment: Procedure was tolerated well Level of Consciousness (Post- Awake and Alert procedure): Post Debridement Measurements of Total Wound Length: (cm) 0.8 Stage: Category/Stage IV Width: (cm) 0.7 Depth: (cm) 0.1 Volume: (cm) 0.044 Character of Wound/Ulcer Post Debridement: Stable Post Procedure Diagnosis Same as Pre-procedure Electronic Signature(s) Signed: 05/21/2023 4:16:21 PM By: Zenaida Deed RN, BSN Signed: 05/21/2023 4:31:02 PM By: Duanne Guess MD FACS Entered By: Zenaida Deed on 05/21/2023 15:34:40 -------------------------------------------------------------------------------- Debridement Details Patient Name: Date of Service: [name]. 05/21/2023 2:45 PM Medical Record Number: [id number] Patient Account Number: [id number] Date of Birth/Sex: Treating RN: [birth year] (89 y.o. [name] Primary Care Provider: [name] Other Clinician: Referring Provider: Treating Provider/Extender: Claudie Leach Weeks in Treatment: 10 Debridement Performed for Assessment: Wound #15 Right,Dorsal T Great oe Performed By: Physician Duanne Guess, MD The following information was scribed by: Zenaida Deed The information was scribed for: Duanne Guess Debridement Type: Debridement Severity of Tissue Pre Debridement: Fat layer exposed [name], [name] ([id number]) [id number] Page 4 of 18 Level of Consciousness (Pre-procedure): Awake and Alert Pre-procedure Verification/Time Out Yes - 15:25 Taken: Start Time: 15:26 Pain Control: Lidocaine 4% Topical Solution Percent of Wound Bed Debrided: 100% T Area Debrided (cm): otal 0.09 Tissue and other material debrided: Non-Viable, Eschar, Slough, Slough Level: Non-Viable Tissue Debridement Description: Selective/Open Wound Instrument: Curette Bleeding: Minimum Hemostasis  Achieved: Pressure Procedural Pain: 2 Post Procedural Pain: 0 Response to Treatment: Procedure was tolerated well Level of Consciousness (Post- Awake and Alert procedure): Post Debridement Measurements of Total Wound Length: (cm) 0.4 Width: (cm) 0.3 Depth: (cm) 0.1 Volume: (cm) 0.009 Character of Wound/Ulcer Post Debridement: Stable Severity of Tissue Post Debridement: Fat layer exposed Post Procedure Diagnosis Same as Pre-procedure Electronic Signature(s) Signed: 05/21/2023 4:16:21 PM By: Zenaida Deed RN, BSN Signed: 05/21/2023 4:31:02 PM By: Duanne Guess MD FACS Entered By: Zenaida Deed on 05/21/2023 15:35:34 -------------------------------------------------------------------------------- Debridement Details Patient Name: Date of Service: [name]. 05/21/2023 2:45 PM Medical Record Number: [id number] Patient Account Number: [id number] Date of Birth/Sex: Treating RN: Oct 01, 1927 (89 y.o. [name] Primary Care Provider: [name] Other Clinician: Referring Provider: Treating Provider/Extender: Claudie Leach Weeks in Treatment: 10 Debridement Performed for Assessment: Wound #13 Left,Dorsal T Great oe Performed By: Physician Duanne Guess, MD The following information was scribed by: Zenaida Deed The information was scribed for: Duanne Guess Debridement Type: Debridement Severity of Tissue Pre Debridement: Bone involvement without necrosis Level of Consciousness (Pre-procedure): Awake and Alert Pre-procedure Verification/Time Out Yes - 15:25 Taken: Start Time: 15:26 Pain Control: Lidocaine 4% T opical Solution Percent of Wound  Debridement Details Patient Name: Date of Service: [name], [name] 05/21/2023 2:45 PM Medical Record Number: [id number] Patient Account Number: [id number] Date of Birth/Sex: Treating RN: 06/18/1928 (89 y.o. [name] Primary Care Provider: [name] Other Clinician: Referring Provider: Treating Provider/Extender: Claudie Leach Weeks in Treatment: 10 Debridement Performed for Assessment: Wound #11 Right T Second oe Performed By: Physician Duanne Guess, MD The following information was scribed by: Zenaida Deed The information was scribed for: Duanne Guess Debridement Type: Debridement Level of Consciousness (Pre-procedure): Awake and Alert Pre-procedure Verification/Time Out Yes - 15:25 Taken: Start Time: 15:26 Pain Control: Lidocaine 4% T opical Solution Percent of Wound Bed Debrided: 100% T Area Debrided (cm): otal 0.44 Tissue and other material debrided: Non-Viable, Slough, Slough Level: Non-Viable Tissue Debridement Description: Selective/Open Wound Instrument: Curette Bleeding:  Minimum Hemostasis Achieved: Pressure Procedural Pain: 2 Post Procedural Pain: 0 Response to Treatment: Procedure was tolerated well Level of Consciousness (Post- Awake and Alert procedure): Post Debridement Measurements of Total Wound Length: (cm) 0.8 Stage: Category/Stage IV Width: (cm) 0.7 Depth: (cm) 0.1 Volume: (cm) 0.044 Character of Wound/Ulcer Post Debridement: Stable Post Procedure Diagnosis Same as Pre-procedure Electronic Signature(s) Signed: 05/21/2023 4:16:21 PM By: Zenaida Deed RN, BSN Signed: 05/21/2023 4:31:02 PM By: Duanne Guess MD FACS Entered By: Zenaida Deed on 05/21/2023 15:34:40 -------------------------------------------------------------------------------- Debridement Details Patient Name: Date of Service: [name]. 05/21/2023 2:45 PM Medical Record Number: [id number] Patient Account Number: [id number] Date of Birth/Sex: Treating RN: [birth year] (89 y.o. [name] Primary Care Provider: [name] Other Clinician: Referring Provider: Treating Provider/Extender: Claudie Leach Weeks in Treatment: 10 Debridement Performed for Assessment: Wound #15 Right,Dorsal T Great oe Performed By: Physician Duanne Guess, MD The following information was scribed by: Zenaida Deed The information was scribed for: Duanne Guess Debridement Type: Debridement Severity of Tissue Pre Debridement: Fat layer exposed [name], [name] ([id number]) [id number] Page 4 of 18 Level of Consciousness (Pre-procedure): Awake and Alert Pre-procedure Verification/Time Out Yes - 15:25 Taken: Start Time: 15:26 Pain Control: Lidocaine 4% Topical Solution Percent of Wound Bed Debrided: 100% T Area Debrided (cm): otal 0.09 Tissue and other material debrided: Non-Viable, Eschar, Slough, Slough Level: Non-Viable Tissue Debridement Description: Selective/Open Wound Instrument: Curette Bleeding: Minimum Hemostasis  Achieved: Pressure Procedural Pain: 2 Post Procedural Pain: 0 Response to Treatment: Procedure was tolerated well Level of Consciousness (Post- Awake and Alert procedure): Post Debridement Measurements of Total Wound Length: (cm) 0.4 Width: (cm) 0.3 Depth: (cm) 0.1 Volume: (cm) 0.009 Character of Wound/Ulcer Post Debridement: Stable Severity of Tissue Post Debridement: Fat layer exposed Post Procedure Diagnosis Same as Pre-procedure Electronic Signature(s) Signed: 05/21/2023 4:16:21 PM By: Zenaida Deed RN, BSN Signed: 05/21/2023 4:31:02 PM By: Duanne Guess MD FACS Entered By: Zenaida Deed on 05/21/2023 15:35:34 -------------------------------------------------------------------------------- Debridement Details Patient Name: Date of Service: [name]. 05/21/2023 2:45 PM Medical Record Number: [id number] Patient Account Number: [id number] Date of Birth/Sex: Treating RN: Oct 01, 1927 (89 y.o. [name] Primary Care Provider: [name] Other Clinician: Referring Provider: Treating Provider/Extender: Claudie Leach Weeks in Treatment: 10 Debridement Performed for Assessment: Wound #13 Left,Dorsal T Great oe Performed By: Physician Duanne Guess, MD The following information was scribed by: Zenaida Deed The information was scribed for: Duanne Guess Debridement Type: Debridement Severity of Tissue Pre Debridement: Bone involvement without necrosis Level of Consciousness (Pre-procedure): Awake and Alert Pre-procedure Verification/Time Out Yes - 15:25 Taken: Start Time: 15:26 Pain Control: Lidocaine 4% T opical Solution Percent of Wound  05/21/2023 2:45 PM Medical Record Number: [id number] Patient Account Number: [id number] Date of Birth/Sex: Treating RN: 1928/06/02 (89 y.o. [name] Primary Care Provider: [name] Other Clinician: Referring Provider: Treating Provider/Extender: Claudie Leach Weeks in Treatment: 10 Subjective Chief Complaint Information obtained from Patient 01/28/2022; right second toe amputation site dehiscence and bilateral lower extremity wounds. 03/10/2023: pressure ulcers of right heel, ulcers on toes History of Present Illness (HPI) Admission 01/28/2022 [name] is a 89 year old female with a past medical history of idiopathic peripheral neuropathy status post amputation to the second right toe secondary to osteomyelitis, COPD and A-fib on Coumadin the presents to the clinic for a 45-month history of nonhealing ulcer to a previous amputation site on the second right toe. She states she has tried Medihoney and silver alginate in the past to this area with little benefit. She also has 2 small areas limited to skin breakdown to her lower extremities bilaterally. She has chronic venous insufficiency but not has not been wearing her compression stockings. She states she bumped her legs against an object and not so the wound started. She has been using Medihoney to the sites. She denies signs of infection. 6/2; patient presents for follow-up. She had an x-ray of her right foot done at last clinic visit and this was negative for evidence of osteomyelitis. She also had a wound culture done that showed extra high levels of Staph aureus. I recommended Keystone antibiotics for this and this was ordered. She had ABIs with TBI's done as well that showed monophasic waveforms to the right foot with TBI of 0 and an ABI of 0.52. Urgent referral was made to vein and vascular and she saw Dr. Durwin Nora on 6/1, yesterday and he recommended an arteriogram. This is scheduled for 6/16.  Patient also reports a new wound to the right great toe. This is a blister that has ruptured. She also reports increased erythema to the toe. 6/6; the patient was worked in urgently today at the insistence of her daughter out of concern for a new wound on the lateral part of the plantar right great toe. She has her original postsurgical wound after the amputation of the right second toe, she has a wound on the medial part of the right great toe. The patient is apparently going for an angiogram by Dr. Durwin Nora in 2 weeks time. 6/13; patient presents for follow-up. She has been using bacitracin to the abrasion on the right great toe. She has been using collagen and Keystone antibiotic to the amputation site. She has no issues or complaints today. She denies signs of infection. 6/22; patient presents for follow-up. She states that her abdominal aortogram was canceled due to her renal function. She has been using Keystone antibiotics to the amputation site and Medihoney to the right great toe wound. At the pace of the right great toe she has a slitlike open area that she thinks was caused by the tape from the dressing. 6/29; patient presents for follow-up. She has been using Keystone antibiotics to the amputation site along with collagen. She has been using Medihoney to the right great toe wound. She has no other wounds. She denies signs of infection. 7/13; patient presents for follow-up. She has been using Keystone antibiotics and collagen to the wound sites. She currently denies signs of infection. She states she is scheduled to see me nephrology next month. 7/20; patient presents for follow-up. She continue Keystone antibiotics and collagen to the wound sites. She has  Debridement Details Patient Name: Date of Service: [name], [name] 05/21/2023 2:45 PM Medical Record Number: [id number] Patient Account Number: [id number] Date of Birth/Sex: Treating RN: 06/18/1928 (89 y.o. [name] Primary Care Provider: [name] Other Clinician: Referring Provider: Treating Provider/Extender: Claudie Leach Weeks in Treatment: 10 Debridement Performed for Assessment: Wound #11 Right T Second oe Performed By: Physician Duanne Guess, MD The following information was scribed by: Zenaida Deed The information was scribed for: Duanne Guess Debridement Type: Debridement Level of Consciousness (Pre-procedure): Awake and Alert Pre-procedure Verification/Time Out Yes - 15:25 Taken: Start Time: 15:26 Pain Control: Lidocaine 4% T opical Solution Percent of Wound Bed Debrided: 100% T Area Debrided (cm): otal 0.44 Tissue and other material debrided: Non-Viable, Slough, Slough Level: Non-Viable Tissue Debridement Description: Selective/Open Wound Instrument: Curette Bleeding:  Minimum Hemostasis Achieved: Pressure Procedural Pain: 2 Post Procedural Pain: 0 Response to Treatment: Procedure was tolerated well Level of Consciousness (Post- Awake and Alert procedure): Post Debridement Measurements of Total Wound Length: (cm) 0.8 Stage: Category/Stage IV Width: (cm) 0.7 Depth: (cm) 0.1 Volume: (cm) 0.044 Character of Wound/Ulcer Post Debridement: Stable Post Procedure Diagnosis Same as Pre-procedure Electronic Signature(s) Signed: 05/21/2023 4:16:21 PM By: Zenaida Deed RN, BSN Signed: 05/21/2023 4:31:02 PM By: Duanne Guess MD FACS Entered By: Zenaida Deed on 05/21/2023 15:34:40 -------------------------------------------------------------------------------- Debridement Details Patient Name: Date of Service: [name]. 05/21/2023 2:45 PM Medical Record Number: [id number] Patient Account Number: [id number] Date of Birth/Sex: Treating RN: [birth year] (89 y.o. [name] Primary Care Provider: [name] Other Clinician: Referring Provider: Treating Provider/Extender: Claudie Leach Weeks in Treatment: 10 Debridement Performed for Assessment: Wound #15 Right,Dorsal T Great oe Performed By: Physician Duanne Guess, MD The following information was scribed by: Zenaida Deed The information was scribed for: Duanne Guess Debridement Type: Debridement Severity of Tissue Pre Debridement: Fat layer exposed [name], [name] ([id number]) [id number] Page 4 of 18 Level of Consciousness (Pre-procedure): Awake and Alert Pre-procedure Verification/Time Out Yes - 15:25 Taken: Start Time: 15:26 Pain Control: Lidocaine 4% Topical Solution Percent of Wound Bed Debrided: 100% T Area Debrided (cm): otal 0.09 Tissue and other material debrided: Non-Viable, Eschar, Slough, Slough Level: Non-Viable Tissue Debridement Description: Selective/Open Wound Instrument: Curette Bleeding: Minimum Hemostasis  Achieved: Pressure Procedural Pain: 2 Post Procedural Pain: 0 Response to Treatment: Procedure was tolerated well Level of Consciousness (Post- Awake and Alert procedure): Post Debridement Measurements of Total Wound Length: (cm) 0.4 Width: (cm) 0.3 Depth: (cm) 0.1 Volume: (cm) 0.009 Character of Wound/Ulcer Post Debridement: Stable Severity of Tissue Post Debridement: Fat layer exposed Post Procedure Diagnosis Same as Pre-procedure Electronic Signature(s) Signed: 05/21/2023 4:16:21 PM By: Zenaida Deed RN, BSN Signed: 05/21/2023 4:31:02 PM By: Duanne Guess MD FACS Entered By: Zenaida Deed on 05/21/2023 15:35:34 -------------------------------------------------------------------------------- Debridement Details Patient Name: Date of Service: [name]. 05/21/2023 2:45 PM Medical Record Number: [id number] Patient Account Number: [id number] Date of Birth/Sex: Treating RN: Oct 01, 1927 (89 y.o. [name] Primary Care Provider: [name] Other Clinician: Referring Provider: Treating Provider/Extender: Claudie Leach Weeks in Treatment: 10 Debridement Performed for Assessment: Wound #13 Left,Dorsal T Great oe Performed By: Physician Duanne Guess, MD The following information was scribed by: Zenaida Deed The information was scribed for: Duanne Guess Debridement Type: Debridement Severity of Tissue Pre Debridement: Bone involvement without necrosis Level of Consciousness (Pre-procedure): Awake and Alert Pre-procedure Verification/Time Out Yes - 15:25 Taken: Start Time: 15:26 Pain Control: Lidocaine 4% T opical Solution Percent of Wound  Debridement Details Patient Name: Date of Service: [name], [name] 05/21/2023 2:45 PM Medical Record Number: [id number] Patient Account Number: [id number] Date of Birth/Sex: Treating RN: 06/18/1928 (89 y.o. [name] Primary Care Provider: [name] Other Clinician: Referring Provider: Treating Provider/Extender: Claudie Leach Weeks in Treatment: 10 Debridement Performed for Assessment: Wound #11 Right T Second oe Performed By: Physician Duanne Guess, MD The following information was scribed by: Zenaida Deed The information was scribed for: Duanne Guess Debridement Type: Debridement Level of Consciousness (Pre-procedure): Awake and Alert Pre-procedure Verification/Time Out Yes - 15:25 Taken: Start Time: 15:26 Pain Control: Lidocaine 4% T opical Solution Percent of Wound Bed Debrided: 100% T Area Debrided (cm): otal 0.44 Tissue and other material debrided: Non-Viable, Slough, Slough Level: Non-Viable Tissue Debridement Description: Selective/Open Wound Instrument: Curette Bleeding:  Minimum Hemostasis Achieved: Pressure Procedural Pain: 2 Post Procedural Pain: 0 Response to Treatment: Procedure was tolerated well Level of Consciousness (Post- Awake and Alert procedure): Post Debridement Measurements of Total Wound Length: (cm) 0.8 Stage: Category/Stage IV Width: (cm) 0.7 Depth: (cm) 0.1 Volume: (cm) 0.044 Character of Wound/Ulcer Post Debridement: Stable Post Procedure Diagnosis Same as Pre-procedure Electronic Signature(s) Signed: 05/21/2023 4:16:21 PM By: Zenaida Deed RN, BSN Signed: 05/21/2023 4:31:02 PM By: Duanne Guess MD FACS Entered By: Zenaida Deed on 05/21/2023 15:34:40 -------------------------------------------------------------------------------- Debridement Details Patient Name: Date of Service: [name]. 05/21/2023 2:45 PM Medical Record Number: [id number] Patient Account Number: [id number] Date of Birth/Sex: Treating RN: [birth year] (89 y.o. [name] Primary Care Provider: [name] Other Clinician: Referring Provider: Treating Provider/Extender: Claudie Leach Weeks in Treatment: 10 Debridement Performed for Assessment: Wound #15 Right,Dorsal T Great oe Performed By: Physician Duanne Guess, MD The following information was scribed by: Zenaida Deed The information was scribed for: Duanne Guess Debridement Type: Debridement Severity of Tissue Pre Debridement: Fat layer exposed [name], [name] ([id number]) [id number] Page 4 of 18 Level of Consciousness (Pre-procedure): Awake and Alert Pre-procedure Verification/Time Out Yes - 15:25 Taken: Start Time: 15:26 Pain Control: Lidocaine 4% Topical Solution Percent of Wound Bed Debrided: 100% T Area Debrided (cm): otal 0.09 Tissue and other material debrided: Non-Viable, Eschar, Slough, Slough Level: Non-Viable Tissue Debridement Description: Selective/Open Wound Instrument: Curette Bleeding: Minimum Hemostasis  Achieved: Pressure Procedural Pain: 2 Post Procedural Pain: 0 Response to Treatment: Procedure was tolerated well Level of Consciousness (Post- Awake and Alert procedure): Post Debridement Measurements of Total Wound Length: (cm) 0.4 Width: (cm) 0.3 Depth: (cm) 0.1 Volume: (cm) 0.009 Character of Wound/Ulcer Post Debridement: Stable Severity of Tissue Post Debridement: Fat layer exposed Post Procedure Diagnosis Same as Pre-procedure Electronic Signature(s) Signed: 05/21/2023 4:16:21 PM By: Zenaida Deed RN, BSN Signed: 05/21/2023 4:31:02 PM By: Duanne Guess MD FACS Entered By: Zenaida Deed on 05/21/2023 15:35:34 -------------------------------------------------------------------------------- Debridement Details Patient Name: Date of Service: [name]. 05/21/2023 2:45 PM Medical Record Number: [id number] Patient Account Number: [id number] Date of Birth/Sex: Treating RN: Oct 01, 1927 (89 y.o. [name] Primary Care Provider: [name] Other Clinician: Referring Provider: Treating Provider/Extender: Claudie Leach Weeks in Treatment: 10 Debridement Performed for Assessment: Wound #13 Left,Dorsal T Great oe Performed By: Physician Duanne Guess, MD The following information was scribed by: Zenaida Deed The information was scribed for: Duanne Guess Debridement Type: Debridement Severity of Tissue Pre Debridement: Bone involvement without necrosis Level of Consciousness (Pre-procedure): Awake and Alert Pre-procedure Verification/Time Out Yes - 15:25 Taken: Start Time: 15:26 Pain Control: Lidocaine 4% T opical Solution Percent of Wound  Bed Debrided: 100% T Area Debrided (cm): otal 1.18 Tissue and other material debrided: Non-Viable, Slough, T endon, Slough Level: Skin/Subcutaneous Tissue/Muscle Debridement Description: Excisional Instrument: Curette, Forceps, Scissors Bleeding: Minimum Hemostasis Achieved: Pressure Procedural  Pain: 2 Post Procedural Pain: 0 Response to Treatment: Procedure was tolerated well Level of Consciousness (Post- Awake and Alert procedure): [name], [name] ([id number]) [id number] Page 5 of 18 Post Debridement Measurements of Total Wound Length: (cm) 1 Width: (cm) 1.5 Depth: (cm) 0.1 Volume: (cm) 0.118 Character of Wound/Ulcer Post Debridement: Stable Severity of Tissue Post Debridement: Necrosis of bone Post Procedure Diagnosis Same as Pre-procedure Electronic Signature(s) Signed: 05/21/2023 4:22:27 PM By: Duanne Guess MD FACS Signed: 05/22/2023 11:43:52 AM By: Zenaida Deed RN, BSN Previous Signature: 05/21/2023 4:16:21 PM Version By: Zenaida Deed RN, BSN Entered By: Duanne Guess on 05/21/2023 16:22:27 -------------------------------------------------------------------------------- Debridement Details Patient Name: Date of Service: [name]. 05/21/2023 2:45 PM Medical Record Number: [id number] Patient Account Number: [id number] Date of Birth/Sex: Treating RN: 1928/07/17 (89 y.o. [name] Primary Care Provider: [name] Other Clinician: Referring Provider: Treating Provider/Extender: Claudie Leach Weeks in Treatment: 10 Debridement Performed for Assessment: Wound #9 Left Calcaneus Performed By: Physician Duanne Guess, MD The following information was scribed by: Zenaida Deed The information was scribed for: Duanne Guess Debridement Type: Debridement Level of Consciousness (Pre-procedure): Awake and Alert Pre-procedure Verification/Time Out Yes - 15:25 Taken: Start Time: 15:26 Pain Control: Lidocaine 4% T opical Solution Percent of Wound Bed Debrided: 100% T Area Debrided (cm): otal 0.33 Tissue and other material debrided: Non-Viable, Slough, Subcutaneous, Slough Level: Skin/Subcutaneous Tissue Debridement Description: Excisional Instrument: Curette Bleeding: Minimum Hemostasis  Achieved: Pressure Procedural Pain: 2 Post Procedural Pain: 0 Response to Treatment: Procedure was tolerated well Level of Consciousness (Post- Awake and Alert procedure): Post Debridement Measurements of Total Wound Length: (cm) 0.7 Stage: Category/Stage II Width: (cm) 0.6 Depth: (cm) 0.1 Volume: (cm) 0.033 Character of Wound/Ulcer Post Debridement: Stable Post Procedure Diagnosis Same as Pre-procedure Electronic Signature(s) Signed: 05/21/2023 4:22:43 PM By: Duanne Guess MD FACS Signed: 05/22/2023 11:43:52 AM By: Zenaida Deed RN, BSN Previous Signature: 05/21/2023 4:16:21 PM Version By: Zenaida Deed RN, BSN Entered By: Duanne Guess on 05/21/2023 16:22:43 [name], [name] ([id number]) [id number] Page 6 of 18 -------------------------------------------------------------------------------- HPI Details Patient Name: Date of Service: [name], [name] 05/21/2023 2:45 PM Medical Record Number: [id number] Patient Account Number: [id number] Date of Birth/Sex: Treating RN: 09/01/1928 (89 y.o. [name] Primary Care Provider: [name] Other Clinician: Referring Provider: Treating Provider/Extender: Claudie Leach Weeks in Treatment: 10 History of Present Illness HPI Description: Admission 01/28/2022 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 She states she has tried Medihoney and silver alginate in the past to this area with little benefit. She also has 2 small areas limited to skin breakdown to her lower extremities bilaterally. She has chronic venous insufficiency but not has not been wearing her compression stockings. She states she bumped  her legs against an object and not so the wound started. She has been using Medihoney to the sites. She denies signs of infection. 6/2; patient presents for follow-up. She had an x-ray of her right foot done at last clinic visit and this was  05/21/2023 2:45 PM Medical Record Number: [id number] Patient Account Number: [id number] Date of Birth/Sex: Treating RN: 1928/06/02 (89 y.o. [name] Primary Care Provider: [name] Other Clinician: Referring Provider: Treating Provider/Extender: Claudie Leach Weeks in Treatment: 10 Subjective Chief Complaint Information obtained from Patient 01/28/2022; right second toe amputation site dehiscence and bilateral lower extremity wounds. 03/10/2023: pressure ulcers of right heel, ulcers on toes History of Present Illness (HPI) Admission 01/28/2022 [name] is a 89 year old female with a past medical history of idiopathic peripheral neuropathy status post amputation to the second right toe secondary to osteomyelitis, COPD and A-fib on Coumadin the presents to the clinic for a 45-month history of nonhealing ulcer to a previous amputation site on the second right toe. She states she has tried Medihoney and silver alginate in the past to this area with little benefit. She also has 2 small areas limited to skin breakdown to her lower extremities bilaterally. She has chronic venous insufficiency but not has not been wearing her compression stockings. She states she bumped her legs against an object and not so the wound started. She has been using Medihoney to the sites. She denies signs of infection. 6/2; patient presents for follow-up. She had an x-ray of her right foot done at last clinic visit and this was negative for evidence of osteomyelitis. She also had a wound culture done that showed extra high levels of Staph aureus. I recommended Keystone antibiotics for this and this was ordered. She had ABIs with TBI's done as well that showed monophasic waveforms to the right foot with TBI of 0 and an ABI of 0.52. Urgent referral was made to vein and vascular and she saw Dr. Durwin Nora on 6/1, yesterday and he recommended an arteriogram. This is scheduled for 6/16.  Patient also reports a new wound to the right great toe. This is a blister that has ruptured. She also reports increased erythema to the toe. 6/6; the patient was worked in urgently today at the insistence of her daughter out of concern for a new wound on the lateral part of the plantar right great toe. She has her original postsurgical wound after the amputation of the right second toe, she has a wound on the medial part of the right great toe. The patient is apparently going for an angiogram by Dr. Durwin Nora in 2 weeks time. 6/13; patient presents for follow-up. She has been using bacitracin to the abrasion on the right great toe. She has been using collagen and Keystone antibiotic to the amputation site. She has no issues or complaints today. She denies signs of infection. 6/22; patient presents for follow-up. She states that her abdominal aortogram was canceled due to her renal function. She has been using Keystone antibiotics to the amputation site and Medihoney to the right great toe wound. At the pace of the right great toe she has a slitlike open area that she thinks was caused by the tape from the dressing. 6/29; patient presents for follow-up. She has been using Keystone antibiotics to the amputation site along with collagen. She has been using Medihoney to the right great toe wound. She has no other wounds. She denies signs of infection. 7/13; patient presents for follow-up. She has been using Keystone antibiotics and collagen to the wound sites. She currently denies signs of infection. She states she is scheduled to see me nephrology next month. 7/20; patient presents for follow-up. She continue Keystone antibiotics and collagen to the wound sites. She has  [name], [name] ([id number]) [id number] Page 1 of 18 Visit Report for 05/21/2023 Chief Complaint Document Details Patient Name: Date of Service: [name], [name] 05/21/2023 2:45 PM Medical Record Number: [id number] Patient Account Number: [id number] Date of Birth/Sex: Treating RN: [birth year] (89 y.o. [name] Primary Care Provider: [name] Other Clinician: Referring Provider: Treating Provider/Extender: Claudie Leach Weeks in Treatment: 10 Information Obtained from: Patient Chief Complaint 01/28/2022; right second toe amputation site dehiscence and bilateral lower extremity wounds. 03/10/2023: pressure ulcers of right heel, ulcers on toes Electronic Signature(s) Signed: 05/21/2023 4:15:09 PM By: Duanne Guess MD FACS Entered By: Duanne Guess on 05/21/2023 16:15:09 -------------------------------------------------------------------------------- Debridement Details Patient Name: Date of Service: [name]. 05/21/2023 2:45 PM Medical Record Number: [id number] Patient Account Number: [id number] Date of Birth/Sex: Treating RN: Jun 28, 1928 (89 y.o. [name] Primary Care Provider: [name] Other Clinician: Referring Provider: Treating Provider/Extender: Claudie Leach Weeks in Treatment: 10 Debridement Performed for Assessment: Wound #12 Left T Great oe Performed By: Physician Duanne Guess, MD The following information was scribed by: Zenaida Deed The information was scribed for: Duanne Guess Debridement Type: Debridement Severity of Tissue Pre Debridement: Fat layer exposed Level of Consciousness (Pre-procedure): Awake and Alert Pre-procedure Verification/Time Out Yes - 15:25 Taken: Start Time: 15:26 Pain Control: Lidocaine 4% Topical Solution Percent of Wound Bed Debrided: 100% T Area Debrided (cm): otal 0.12 Tissue and other material debrided: Non-Viable, Eschar, Slough,  Slough Level: Non-Viable Tissue Debridement Description: Selective/Open Wound Instrument: Curette Bleeding: Minimum Hemostasis Achieved: Pressure Procedural Pain: 2 Post Procedural Pain: 0 Response to Treatment: Procedure was tolerated well Level of Consciousness (Post- Awake and Alert procedure): Post Debridement Measurements of Total Wound Length: (cm) 0.3 Width: (cm) 0.5 [name], [name] ([id number]) [id number] Page 2 of 18 Depth: (cm) 0.1 Volume: (cm) 0.012 Character of Wound/Ulcer Post Debridement: Stable Severity of Tissue Post Debridement: Bone involvement without necrosis Post Procedure Diagnosis Same as Pre-procedure Electronic Signature(s) Signed: 05/21/2023 4:16:21 PM By: Zenaida Deed RN, BSN Signed: 05/21/2023 4:31:02 PM By: Duanne Guess MD FACS Entered By: Zenaida Deed on 05/21/2023 15:33:03 -------------------------------------------------------------------------------- Debridement Details Patient Name: Date of Service: [name]. 05/21/2023 2:45 PM Medical Record Number: [id number] Patient Account Number: [id number] Date of Birth/Sex: Treating RN: [birth year] (89 y.o. [name] Primary Care Provider: [name] Other Clinician: Referring Provider: Treating Provider/Extender: Claudie Leach Weeks in Treatment: 10 Debridement Performed for Assessment: Wound #10 Left T Second oe Performed By: Physician Duanne Guess, MD The following information was scribed by: Zenaida Deed The information was scribed for: Duanne Guess Debridement Type: Debridement Level of Consciousness (Pre-procedure): Awake and Alert Pre-procedure Verification/Time Out Yes - 15:25 Taken: Start Time: 15:26 Pain Control: Lidocaine 4% T opical Solution Percent of Wound Bed Debrided: 100% T Area Debrided (cm): otal 0.16 Tissue and other material debrided: Non-Viable, Slough, Slough Level: Non-Viable  Tissue Debridement Description: Selective/Open Wound Instrument: Curette Bleeding: Minimum Hemostasis Achieved: Pressure Procedural Pain: 2 Post Procedural Pain: 0 Response to Treatment: Procedure was tolerated well Level of Consciousness (Post- Awake and Alert procedure): Post Debridement Measurements of Total Wound Length: (cm) 0.3 Stage: Category/Stage IV Width: (cm) 0.7 Depth: (cm) 0.1 Volume: (cm) 0.016 Character of Wound/Ulcer Post Debridement: Stable Post Procedure Diagnosis Same as Pre-procedure Electronic Signature(s) Signed: 05/21/2023 4:16:21 PM By: Zenaida Deed RN, BSN Signed: 05/21/2023 4:31:02 PM By: Duanne Guess MD FACS Entered By: Zenaida Deed on 05/21/2023 15:33:47 Klimaszewski, [name] ([id number]) [id number] Page 3 of 18 --------------------------------------------------------------------------------  Bed Debrided: 100% T Area Debrided (cm): otal 1.18 Tissue and other material debrided: Non-Viable, Slough, T endon, Slough Level: Skin/Subcutaneous Tissue/Muscle Debridement Description: Excisional Instrument: Curette, Forceps, Scissors Bleeding: Minimum Hemostasis Achieved: Pressure Procedural  Pain: 2 Post Procedural Pain: 0 Response to Treatment: Procedure was tolerated well Level of Consciousness (Post- Awake and Alert procedure): [name], [name] ([id number]) [id number] Page 5 of 18 Post Debridement Measurements of Total Wound Length: (cm) 1 Width: (cm) 1.5 Depth: (cm) 0.1 Volume: (cm) 0.118 Character of Wound/Ulcer Post Debridement: Stable Severity of Tissue Post Debridement: Necrosis of bone Post Procedure Diagnosis Same as Pre-procedure Electronic Signature(s) Signed: 05/21/2023 4:22:27 PM By: Duanne Guess MD FACS Signed: 05/22/2023 11:43:52 AM By: Zenaida Deed RN, BSN Previous Signature: 05/21/2023 4:16:21 PM Version By: Zenaida Deed RN, BSN Entered By: Duanne Guess on 05/21/2023 16:22:27 -------------------------------------------------------------------------------- Debridement Details Patient Name: Date of Service: [name]. 05/21/2023 2:45 PM Medical Record Number: [id number] Patient Account Number: [id number] Date of Birth/Sex: Treating RN: 1928/07/17 (89 y.o. [name] Primary Care Provider: [name] Other Clinician: Referring Provider: Treating Provider/Extender: Claudie Leach Weeks in Treatment: 10 Debridement Performed for Assessment: Wound #9 Left Calcaneus Performed By: Physician Duanne Guess, MD The following information was scribed by: Zenaida Deed The information was scribed for: Duanne Guess Debridement Type: Debridement Level of Consciousness (Pre-procedure): Awake and Alert Pre-procedure Verification/Time Out Yes - 15:25 Taken: Start Time: 15:26 Pain Control: Lidocaine 4% T opical Solution Percent of Wound Bed Debrided: 100% T Area Debrided (cm): otal 0.33 Tissue and other material debrided: Non-Viable, Slough, Subcutaneous, Slough Level: Skin/Subcutaneous Tissue Debridement Description: Excisional Instrument: Curette Bleeding: Minimum Hemostasis  Achieved: Pressure Procedural Pain: 2 Post Procedural Pain: 0 Response to Treatment: Procedure was tolerated well Level of Consciousness (Post- Awake and Alert procedure): Post Debridement Measurements of Total Wound Length: (cm) 0.7 Stage: Category/Stage II Width: (cm) 0.6 Depth: (cm) 0.1 Volume: (cm) 0.033 Character of Wound/Ulcer Post Debridement: Stable Post Procedure Diagnosis Same as Pre-procedure Electronic Signature(s) Signed: 05/21/2023 4:22:43 PM By: Duanne Guess MD FACS Signed: 05/22/2023 11:43:52 AM By: Zenaida Deed RN, BSN Previous Signature: 05/21/2023 4:16:21 PM Version By: Zenaida Deed RN, BSN Entered By: Duanne Guess on 05/21/2023 16:22:43 [name], [name] ([id number]) [id number] Page 6 of 18 -------------------------------------------------------------------------------- HPI Details Patient Name: Date of Service: [name], [name] 05/21/2023 2:45 PM Medical Record Number: [id number] Patient Account Number: [id number] Date of Birth/Sex: Treating RN: 09/01/1928 (89 y.o. [name] Primary Care Provider: [name] Other Clinician: Referring Provider: Treating Provider/Extender: Claudie Leach Weeks in Treatment: 10 History of Present Illness HPI Description: Admission 01/28/2022 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 She states she has tried Medihoney and silver alginate in the past to this area with little benefit. She also has 2 small areas limited to skin breakdown to her lower extremities bilaterally. She has chronic venous insufficiency but not has not been wearing her compression stockings. She states she bumped  her legs against an object and not so the wound started. She has been using Medihoney to the sites. She denies signs of infection. 6/2; patient presents for follow-up. She had an x-ray of her right foot done at last clinic visit and this was  Bed Debrided: 100% T Area Debrided (cm): otal 1.18 Tissue and other material debrided: Non-Viable, Slough, T endon, Slough Level: Skin/Subcutaneous Tissue/Muscle Debridement Description: Excisional Instrument: Curette, Forceps, Scissors Bleeding: Minimum Hemostasis Achieved: Pressure Procedural  Pain: 2 Post Procedural Pain: 0 Response to Treatment: Procedure was tolerated well Level of Consciousness (Post- Awake and Alert procedure): [name], [name] ([id number]) [id number] Page 5 of 18 Post Debridement Measurements of Total Wound Length: (cm) 1 Width: (cm) 1.5 Depth: (cm) 0.1 Volume: (cm) 0.118 Character of Wound/Ulcer Post Debridement: Stable Severity of Tissue Post Debridement: Necrosis of bone Post Procedure Diagnosis Same as Pre-procedure Electronic Signature(s) Signed: 05/21/2023 4:22:27 PM By: Duanne Guess MD FACS Signed: 05/22/2023 11:43:52 AM By: Zenaida Deed RN, BSN Previous Signature: 05/21/2023 4:16:21 PM Version By: Zenaida Deed RN, BSN Entered By: Duanne Guess on 05/21/2023 16:22:27 -------------------------------------------------------------------------------- Debridement Details Patient Name: Date of Service: [name]. 05/21/2023 2:45 PM Medical Record Number: [id number] Patient Account Number: [id number] Date of Birth/Sex: Treating RN: 1928/07/17 (89 y.o. [name] Primary Care Provider: [name] Other Clinician: Referring Provider: Treating Provider/Extender: Claudie Leach Weeks in Treatment: 10 Debridement Performed for Assessment: Wound #9 Left Calcaneus Performed By: Physician Duanne Guess, MD The following information was scribed by: Zenaida Deed The information was scribed for: Duanne Guess Debridement Type: Debridement Level of Consciousness (Pre-procedure): Awake and Alert Pre-procedure Verification/Time Out Yes - 15:25 Taken: Start Time: 15:26 Pain Control: Lidocaine 4% T opical Solution Percent of Wound Bed Debrided: 100% T Area Debrided (cm): otal 0.33 Tissue and other material debrided: Non-Viable, Slough, Subcutaneous, Slough Level: Skin/Subcutaneous Tissue Debridement Description: Excisional Instrument: Curette Bleeding: Minimum Hemostasis  Achieved: Pressure Procedural Pain: 2 Post Procedural Pain: 0 Response to Treatment: Procedure was tolerated well Level of Consciousness (Post- Awake and Alert procedure): Post Debridement Measurements of Total Wound Length: (cm) 0.7 Stage: Category/Stage II Width: (cm) 0.6 Depth: (cm) 0.1 Volume: (cm) 0.033 Character of Wound/Ulcer Post Debridement: Stable Post Procedure Diagnosis Same as Pre-procedure Electronic Signature(s) Signed: 05/21/2023 4:22:43 PM By: Duanne Guess MD FACS Signed: 05/22/2023 11:43:52 AM By: Zenaida Deed RN, BSN Previous Signature: 05/21/2023 4:16:21 PM Version By: Zenaida Deed RN, BSN Entered By: Duanne Guess on 05/21/2023 16:22:43 [name], [name] ([id number]) [id number] Page 6 of 18 -------------------------------------------------------------------------------- HPI Details Patient Name: Date of Service: [name], [name] 05/21/2023 2:45 PM Medical Record Number: [id number] Patient Account Number: [id number] Date of Birth/Sex: Treating RN: 09/01/1928 (89 y.o. [name] Primary Care Provider: [name] Other Clinician: Referring Provider: Treating Provider/Extender: Claudie Leach Weeks in Treatment: 10 History of Present Illness HPI Description: Admission 01/28/2022 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 She states she has tried Medihoney and silver alginate in the past to this area with little benefit. She also has 2 small areas limited to skin breakdown to her lower extremities bilaterally. She has chronic venous insufficiency but not has not been wearing her compression stockings. She states she bumped  her legs against an object and not so the wound started. She has been using Medihoney to the sites. She denies signs of infection. 6/2; patient presents for follow-up. She had an x-ray of her right foot done at last clinic visit and this was  Debridement Details Patient Name: Date of Service: [name], [name] 05/21/2023 2:45 PM Medical Record Number: [id number] Patient Account Number: [id number] Date of Birth/Sex: Treating RN: 06/18/1928 (89 y.o. [name] Primary Care Provider: [name] Other Clinician: Referring Provider: Treating Provider/Extender: Claudie Leach Weeks in Treatment: 10 Debridement Performed for Assessment: Wound #11 Right T Second oe Performed By: Physician Duanne Guess, MD The following information was scribed by: Zenaida Deed The information was scribed for: Duanne Guess Debridement Type: Debridement Level of Consciousness (Pre-procedure): Awake and Alert Pre-procedure Verification/Time Out Yes - 15:25 Taken: Start Time: 15:26 Pain Control: Lidocaine 4% T opical Solution Percent of Wound Bed Debrided: 100% T Area Debrided (cm): otal 0.44 Tissue and other material debrided: Non-Viable, Slough, Slough Level: Non-Viable Tissue Debridement Description: Selective/Open Wound Instrument: Curette Bleeding:  Minimum Hemostasis Achieved: Pressure Procedural Pain: 2 Post Procedural Pain: 0 Response to Treatment: Procedure was tolerated well Level of Consciousness (Post- Awake and Alert procedure): Post Debridement Measurements of Total Wound Length: (cm) 0.8 Stage: Category/Stage IV Width: (cm) 0.7 Depth: (cm) 0.1 Volume: (cm) 0.044 Character of Wound/Ulcer Post Debridement: Stable Post Procedure Diagnosis Same as Pre-procedure Electronic Signature(s) Signed: 05/21/2023 4:16:21 PM By: Zenaida Deed RN, BSN Signed: 05/21/2023 4:31:02 PM By: Duanne Guess MD FACS Entered By: Zenaida Deed on 05/21/2023 15:34:40 -------------------------------------------------------------------------------- Debridement Details Patient Name: Date of Service: [name]. 05/21/2023 2:45 PM Medical Record Number: [id number] Patient Account Number: [id number] Date of Birth/Sex: Treating RN: [birth year] (89 y.o. [name] Primary Care Provider: [name] Other Clinician: Referring Provider: Treating Provider/Extender: Claudie Leach Weeks in Treatment: 10 Debridement Performed for Assessment: Wound #15 Right,Dorsal T Great oe Performed By: Physician Duanne Guess, MD The following information was scribed by: Zenaida Deed The information was scribed for: Duanne Guess Debridement Type: Debridement Severity of Tissue Pre Debridement: Fat layer exposed [name], [name] ([id number]) [id number] Page 4 of 18 Level of Consciousness (Pre-procedure): Awake and Alert Pre-procedure Verification/Time Out Yes - 15:25 Taken: Start Time: 15:26 Pain Control: Lidocaine 4% Topical Solution Percent of Wound Bed Debrided: 100% T Area Debrided (cm): otal 0.09 Tissue and other material debrided: Non-Viable, Eschar, Slough, Slough Level: Non-Viable Tissue Debridement Description: Selective/Open Wound Instrument: Curette Bleeding: Minimum Hemostasis  Achieved: Pressure Procedural Pain: 2 Post Procedural Pain: 0 Response to Treatment: Procedure was tolerated well Level of Consciousness (Post- Awake and Alert procedure): Post Debridement Measurements of Total Wound Length: (cm) 0.4 Width: (cm) 0.3 Depth: (cm) 0.1 Volume: (cm) 0.009 Character of Wound/Ulcer Post Debridement: Stable Severity of Tissue Post Debridement: Fat layer exposed Post Procedure Diagnosis Same as Pre-procedure Electronic Signature(s) Signed: 05/21/2023 4:16:21 PM By: Zenaida Deed RN, BSN Signed: 05/21/2023 4:31:02 PM By: Duanne Guess MD FACS Entered By: Zenaida Deed on 05/21/2023 15:35:34 -------------------------------------------------------------------------------- Debridement Details Patient Name: Date of Service: [name]. 05/21/2023 2:45 PM Medical Record Number: [id number] Patient Account Number: [id number] Date of Birth/Sex: Treating RN: Oct 01, 1927 (89 y.o. [name] Primary Care Provider: [name] Other Clinician: Referring Provider: Treating Provider/Extender: Claudie Leach Weeks in Treatment: 10 Debridement Performed for Assessment: Wound #13 Left,Dorsal T Great oe Performed By: Physician Duanne Guess, MD The following information was scribed by: Zenaida Deed The information was scribed for: Duanne Guess Debridement Type: Debridement Severity of Tissue Pre Debridement: Bone involvement without necrosis Level of Consciousness (Pre-procedure): Awake and Alert Pre-procedure Verification/Time Out Yes - 15:25 Taken: Start Time: 15:26 Pain Control: Lidocaine 4% T opical Solution Percent of Wound  [name], [name] ([id number]) [id number] Page 1 of 18 Visit Report for 05/21/2023 Chief Complaint Document Details Patient Name: Date of Service: [name], [name] 05/21/2023 2:45 PM Medical Record Number: [id number] Patient Account Number: [id number] Date of Birth/Sex: Treating RN: [birth year] (89 y.o. [name] Primary Care Provider: [name] Other Clinician: Referring Provider: Treating Provider/Extender: Claudie Leach Weeks in Treatment: 10 Information Obtained from: Patient Chief Complaint 01/28/2022; right second toe amputation site dehiscence and bilateral lower extremity wounds. 03/10/2023: pressure ulcers of right heel, ulcers on toes Electronic Signature(s) Signed: 05/21/2023 4:15:09 PM By: Duanne Guess MD FACS Entered By: Duanne Guess on 05/21/2023 16:15:09 -------------------------------------------------------------------------------- Debridement Details Patient Name: Date of Service: [name]. 05/21/2023 2:45 PM Medical Record Number: [id number] Patient Account Number: [id number] Date of Birth/Sex: Treating RN: Jun 28, 1928 (89 y.o. [name] Primary Care Provider: [name] Other Clinician: Referring Provider: Treating Provider/Extender: Claudie Leach Weeks in Treatment: 10 Debridement Performed for Assessment: Wound #12 Left T Great oe Performed By: Physician Duanne Guess, MD The following information was scribed by: Zenaida Deed The information was scribed for: Duanne Guess Debridement Type: Debridement Severity of Tissue Pre Debridement: Fat layer exposed Level of Consciousness (Pre-procedure): Awake and Alert Pre-procedure Verification/Time Out Yes - 15:25 Taken: Start Time: 15:26 Pain Control: Lidocaine 4% Topical Solution Percent of Wound Bed Debrided: 100% T Area Debrided (cm): otal 0.12 Tissue and other material debrided: Non-Viable, Eschar, Slough,  Slough Level: Non-Viable Tissue Debridement Description: Selective/Open Wound Instrument: Curette Bleeding: Minimum Hemostasis Achieved: Pressure Procedural Pain: 2 Post Procedural Pain: 0 Response to Treatment: Procedure was tolerated well Level of Consciousness (Post- Awake and Alert procedure): Post Debridement Measurements of Total Wound Length: (cm) 0.3 Width: (cm) 0.5 [name], [name] ([id number]) [id number] Page 2 of 18 Depth: (cm) 0.1 Volume: (cm) 0.012 Character of Wound/Ulcer Post Debridement: Stable Severity of Tissue Post Debridement: Bone involvement without necrosis Post Procedure Diagnosis Same as Pre-procedure Electronic Signature(s) Signed: 05/21/2023 4:16:21 PM By: Zenaida Deed RN, BSN Signed: 05/21/2023 4:31:02 PM By: Duanne Guess MD FACS Entered By: Zenaida Deed on 05/21/2023 15:33:03 -------------------------------------------------------------------------------- Debridement Details Patient Name: Date of Service: [name]. 05/21/2023 2:45 PM Medical Record Number: [id number] Patient Account Number: [id number] Date of Birth/Sex: Treating RN: [birth year] (89 y.o. [name] Primary Care Provider: [name] Other Clinician: Referring Provider: Treating Provider/Extender: Claudie Leach Weeks in Treatment: 10 Debridement Performed for Assessment: Wound #10 Left T Second oe Performed By: Physician Duanne Guess, MD The following information was scribed by: Zenaida Deed The information was scribed for: Duanne Guess Debridement Type: Debridement Level of Consciousness (Pre-procedure): Awake and Alert Pre-procedure Verification/Time Out Yes - 15:25 Taken: Start Time: 15:26 Pain Control: Lidocaine 4% T opical Solution Percent of Wound Bed Debrided: 100% T Area Debrided (cm): otal 0.16 Tissue and other material debrided: Non-Viable, Slough, Slough Level: Non-Viable  Tissue Debridement Description: Selective/Open Wound Instrument: Curette Bleeding: Minimum Hemostasis Achieved: Pressure Procedural Pain: 2 Post Procedural Pain: 0 Response to Treatment: Procedure was tolerated well Level of Consciousness (Post- Awake and Alert procedure): Post Debridement Measurements of Total Wound Length: (cm) 0.3 Stage: Category/Stage IV Width: (cm) 0.7 Depth: (cm) 0.1 Volume: (cm) 0.016 Character of Wound/Ulcer Post Debridement: Stable Post Procedure Diagnosis Same as Pre-procedure Electronic Signature(s) Signed: 05/21/2023 4:16:21 PM By: Zenaida Deed RN, BSN Signed: 05/21/2023 4:31:02 PM By: Duanne Guess MD FACS Entered By: Zenaida Deed on 05/21/2023 15:33:47 Klimaszewski, [name] ([id number]) [id number] Page 3 of 18 --------------------------------------------------------------------------------  Bed Debrided: 100% T Area Debrided (cm): otal 1.18 Tissue and other material debrided: Non-Viable, Slough, T endon, Slough Level: Skin/Subcutaneous Tissue/Muscle Debridement Description: Excisional Instrument: Curette, Forceps, Scissors Bleeding: Minimum Hemostasis Achieved: Pressure Procedural  Pain: 2 Post Procedural Pain: 0 Response to Treatment: Procedure was tolerated well Level of Consciousness (Post- Awake and Alert procedure): [name], [name] ([id number]) [id number] Page 5 of 18 Post Debridement Measurements of Total Wound Length: (cm) 1 Width: (cm) 1.5 Depth: (cm) 0.1 Volume: (cm) 0.118 Character of Wound/Ulcer Post Debridement: Stable Severity of Tissue Post Debridement: Necrosis of bone Post Procedure Diagnosis Same as Pre-procedure Electronic Signature(s) Signed: 05/21/2023 4:22:27 PM By: Duanne Guess MD FACS Signed: 05/22/2023 11:43:52 AM By: Zenaida Deed RN, BSN Previous Signature: 05/21/2023 4:16:21 PM Version By: Zenaida Deed RN, BSN Entered By: Duanne Guess on 05/21/2023 16:22:27 -------------------------------------------------------------------------------- Debridement Details Patient Name: Date of Service: [name]. 05/21/2023 2:45 PM Medical Record Number: [id number] Patient Account Number: [id number] Date of Birth/Sex: Treating RN: 1928/07/17 (89 y.o. [name] Primary Care Provider: [name] Other Clinician: Referring Provider: Treating Provider/Extender: Claudie Leach Weeks in Treatment: 10 Debridement Performed for Assessment: Wound #9 Left Calcaneus Performed By: Physician Duanne Guess, MD The following information was scribed by: Zenaida Deed The information was scribed for: Duanne Guess Debridement Type: Debridement Level of Consciousness (Pre-procedure): Awake and Alert Pre-procedure Verification/Time Out Yes - 15:25 Taken: Start Time: 15:26 Pain Control: Lidocaine 4% T opical Solution Percent of Wound Bed Debrided: 100% T Area Debrided (cm): otal 0.33 Tissue and other material debrided: Non-Viable, Slough, Subcutaneous, Slough Level: Skin/Subcutaneous Tissue Debridement Description: Excisional Instrument: Curette Bleeding: Minimum Hemostasis  Achieved: Pressure Procedural Pain: 2 Post Procedural Pain: 0 Response to Treatment: Procedure was tolerated well Level of Consciousness (Post- Awake and Alert procedure): Post Debridement Measurements of Total Wound Length: (cm) 0.7 Stage: Category/Stage II Width: (cm) 0.6 Depth: (cm) 0.1 Volume: (cm) 0.033 Character of Wound/Ulcer Post Debridement: Stable Post Procedure Diagnosis Same as Pre-procedure Electronic Signature(s) Signed: 05/21/2023 4:22:43 PM By: Duanne Guess MD FACS Signed: 05/22/2023 11:43:52 AM By: Zenaida Deed RN, BSN Previous Signature: 05/21/2023 4:16:21 PM Version By: Zenaida Deed RN, BSN Entered By: Duanne Guess on 05/21/2023 16:22:43 [name], [name] ([id number]) [id number] Page 6 of 18 -------------------------------------------------------------------------------- HPI Details Patient Name: Date of Service: [name], [name] 05/21/2023 2:45 PM Medical Record Number: [id number] Patient Account Number: [id number] Date of Birth/Sex: Treating RN: 09/01/1928 (89 y.o. [name] Primary Care Provider: [name] Other Clinician: Referring Provider: Treating Provider/Extender: Claudie Leach Weeks in Treatment: 10 History of Present Illness HPI Description: Admission 01/28/2022 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 She states she has tried Medihoney and silver alginate in the past to this area with little benefit. She also has 2 small areas limited to skin breakdown to her lower extremities bilaterally. She has chronic venous insufficiency but not has not been wearing her compression stockings. She states she bumped  her legs against an object and not so the wound started. She has been using Medihoney to the sites. She denies signs of infection. 6/2; patient presents for follow-up. She had an x-ray of her right foot done at last clinic visit and this was  Debridement Details Patient Name: Date of Service: [name], [name] 05/21/2023 2:45 PM Medical Record Number: [id number] Patient Account Number: [id number] Date of Birth/Sex: Treating RN: 06/18/1928 (89 y.o. [name] Primary Care Provider: [name] Other Clinician: Referring Provider: Treating Provider/Extender: Claudie Leach Weeks in Treatment: 10 Debridement Performed for Assessment: Wound #11 Right T Second oe Performed By: Physician Duanne Guess, MD The following information was scribed by: Zenaida Deed The information was scribed for: Duanne Guess Debridement Type: Debridement Level of Consciousness (Pre-procedure): Awake and Alert Pre-procedure Verification/Time Out Yes - 15:25 Taken: Start Time: 15:26 Pain Control: Lidocaine 4% T opical Solution Percent of Wound Bed Debrided: 100% T Area Debrided (cm): otal 0.44 Tissue and other material debrided: Non-Viable, Slough, Slough Level: Non-Viable Tissue Debridement Description: Selective/Open Wound Instrument: Curette Bleeding:  Minimum Hemostasis Achieved: Pressure Procedural Pain: 2 Post Procedural Pain: 0 Response to Treatment: Procedure was tolerated well Level of Consciousness (Post- Awake and Alert procedure): Post Debridement Measurements of Total Wound Length: (cm) 0.8 Stage: Category/Stage IV Width: (cm) 0.7 Depth: (cm) 0.1 Volume: (cm) 0.044 Character of Wound/Ulcer Post Debridement: Stable Post Procedure Diagnosis Same as Pre-procedure Electronic Signature(s) Signed: 05/21/2023 4:16:21 PM By: Zenaida Deed RN, BSN Signed: 05/21/2023 4:31:02 PM By: Duanne Guess MD FACS Entered By: Zenaida Deed on 05/21/2023 15:34:40 -------------------------------------------------------------------------------- Debridement Details Patient Name: Date of Service: [name]. 05/21/2023 2:45 PM Medical Record Number: [id number] Patient Account Number: [id number] Date of Birth/Sex: Treating RN: [birth year] (89 y.o. [name] Primary Care Provider: [name] Other Clinician: Referring Provider: Treating Provider/Extender: Claudie Leach Weeks in Treatment: 10 Debridement Performed for Assessment: Wound #15 Right,Dorsal T Great oe Performed By: Physician Duanne Guess, MD The following information was scribed by: Zenaida Deed The information was scribed for: Duanne Guess Debridement Type: Debridement Severity of Tissue Pre Debridement: Fat layer exposed [name], [name] ([id number]) [id number] Page 4 of 18 Level of Consciousness (Pre-procedure): Awake and Alert Pre-procedure Verification/Time Out Yes - 15:25 Taken: Start Time: 15:26 Pain Control: Lidocaine 4% Topical Solution Percent of Wound Bed Debrided: 100% T Area Debrided (cm): otal 0.09 Tissue and other material debrided: Non-Viable, Eschar, Slough, Slough Level: Non-Viable Tissue Debridement Description: Selective/Open Wound Instrument: Curette Bleeding: Minimum Hemostasis  Achieved: Pressure Procedural Pain: 2 Post Procedural Pain: 0 Response to Treatment: Procedure was tolerated well Level of Consciousness (Post- Awake and Alert procedure): Post Debridement Measurements of Total Wound Length: (cm) 0.4 Width: (cm) 0.3 Depth: (cm) 0.1 Volume: (cm) 0.009 Character of Wound/Ulcer Post Debridement: Stable Severity of Tissue Post Debridement: Fat layer exposed Post Procedure Diagnosis Same as Pre-procedure Electronic Signature(s) Signed: 05/21/2023 4:16:21 PM By: Zenaida Deed RN, BSN Signed: 05/21/2023 4:31:02 PM By: Duanne Guess MD FACS Entered By: Zenaida Deed on 05/21/2023 15:35:34 -------------------------------------------------------------------------------- Debridement Details Patient Name: Date of Service: [name]. 05/21/2023 2:45 PM Medical Record Number: [id number] Patient Account Number: [id number] Date of Birth/Sex: Treating RN: Oct 01, 1927 (89 y.o. [name] Primary Care Provider: [name] Other Clinician: Referring Provider: Treating Provider/Extender: Claudie Leach Weeks in Treatment: 10 Debridement Performed for Assessment: Wound #13 Left,Dorsal T Great oe Performed By: Physician Duanne Guess, MD The following information was scribed by: Zenaida Deed The information was scribed for: Duanne Guess Debridement Type: Debridement Severity of Tissue Pre Debridement: Bone involvement without necrosis Level of Consciousness (Pre-procedure): Awake and Alert Pre-procedure Verification/Time Out Yes - 15:25 Taken: Start Time: 15:26 Pain Control: Lidocaine 4% T opical Solution Percent of Wound  05/21/2023 2:45 PM Medical Record Number: [id number] Patient Account Number: [id number] Date of Birth/Sex: Treating RN: 1928/06/02 (89 y.o. [name] Primary Care Provider: [name] Other Clinician: Referring Provider: Treating Provider/Extender: Claudie Leach Weeks in Treatment: 10 Subjective Chief Complaint Information obtained from Patient 01/28/2022; right second toe amputation site dehiscence and bilateral lower extremity wounds. 03/10/2023: pressure ulcers of right heel, ulcers on toes History of Present Illness (HPI) Admission 01/28/2022 [name] is a 89 year old female with a past medical history of idiopathic peripheral neuropathy status post amputation to the second right toe secondary to osteomyelitis, COPD and A-fib on Coumadin the presents to the clinic for a 45-month history of nonhealing ulcer to a previous amputation site on the second right toe. She states she has tried Medihoney and silver alginate in the past to this area with little benefit. She also has 2 small areas limited to skin breakdown to her lower extremities bilaterally. She has chronic venous insufficiency but not has not been wearing her compression stockings. She states she bumped her legs against an object and not so the wound started. She has been using Medihoney to the sites. She denies signs of infection. 6/2; patient presents for follow-up. She had an x-ray of her right foot done at last clinic visit and this was negative for evidence of osteomyelitis. She also had a wound culture done that showed extra high levels of Staph aureus. I recommended Keystone antibiotics for this and this was ordered. She had ABIs with TBI's done as well that showed monophasic waveforms to the right foot with TBI of 0 and an ABI of 0.52. Urgent referral was made to vein and vascular and she saw Dr. Durwin Nora on 6/1, yesterday and he recommended an arteriogram. This is scheduled for 6/16.  Patient also reports a new wound to the right great toe. This is a blister that has ruptured. She also reports increased erythema to the toe. 6/6; the patient was worked in urgently today at the insistence of her daughter out of concern for a new wound on the lateral part of the plantar right great toe. She has her original postsurgical wound after the amputation of the right second toe, she has a wound on the medial part of the right great toe. The patient is apparently going for an angiogram by Dr. Durwin Nora in 2 weeks time. 6/13; patient presents for follow-up. She has been using bacitracin to the abrasion on the right great toe. She has been using collagen and Keystone antibiotic to the amputation site. She has no issues or complaints today. She denies signs of infection. 6/22; patient presents for follow-up. She states that her abdominal aortogram was canceled due to her renal function. She has been using Keystone antibiotics to the amputation site and Medihoney to the right great toe wound. At the pace of the right great toe she has a slitlike open area that she thinks was caused by the tape from the dressing. 6/29; patient presents for follow-up. She has been using Keystone antibiotics to the amputation site along with collagen. She has been using Medihoney to the right great toe wound. She has no other wounds. She denies signs of infection. 7/13; patient presents for follow-up. She has been using Keystone antibiotics and collagen to the wound sites. She currently denies signs of infection. She states she is scheduled to see me nephrology next month. 7/20; patient presents for follow-up. She continue Keystone antibiotics and collagen to the wound sites. She has  4x4 in (Generic) 1 x Per Day/30 Days Discharge Instructions: Apply over primary dressing as directed. Secured With: 12M Medipore H Soft Cloth Surgical T ape, 4 x 10 (in/yd) (Generic) 1 x Per Day/30 Days Discharge Instructions: Secure with tape as directed. 05/21/2023: No real change to any of the wounds. She saw Dr. Sherral Hammers in vascular surgery on Tuesday and they discussed the fact that her wounds are not really progressing. He is reluctant, based upon his note, to be particularly aggressive in this fragile 89 year old, which is reasonable. I used a curette to debride slough and eschar from the majority of the wounds; I also debrided slough and  subcutaneous tissue from the heel ulcer and slough and tendon from the dorsal left great toe ulcer. We will continue Prisma silver collagen to all of the wound sites. Follow-up in 1 week. Electronic Signature(s) Signed: 05/21/2023 4:23:04 PM By: Duanne Guess MD FACS Previous Signature: 05/21/2023 4:20:03 PM Version By: Duanne Guess MD FACS Entered By: Duanne Guess on 05/21/2023 16:23:04 -------------------------------------------------------------------------------- HxROS Details Patient Name: Date of Service: [name]. 05/21/2023 2:45 PM Medical Record Number: [id number] Patient Account Number: [id number] Date of Birth/Sex: Treating RN: [birth year] (89 y.o. [name] Primary Care Provider: [name] Other Clinician: Referring Provider: Treating Provider/Extender: Claudie Leach Weeks in Treatment: [address] [name] ([id number]) [id number] Page 16 of 18 Information Obtained From Patient Caregiver Chart Eyes Medical History: Positive for: Cataracts Hematologic/Lymphatic Medical History: Positive for: Anemia Past Medical History Notes: Thrombocytopenia, Anticoagulant long-term use Respiratory Medical History: Negative for: Chronic Obstructive Pulmonary Disease (COPD) Past Medical History Notes: Pulmonary embolus Cardiovascular Medical History: Positive for: Arrhythmia - a-fib; Congestive Heart Failure; Hypotension; Peripheral Venous Disease Gastrointestinal Medical History: Past Medical History Notes: Hiatal hernia Endocrine Medical History: Past Medical History Notes: Hyperthyroidism, Hypothyroidism Genitourinary Medical History: Past Medical History Notes: CKD stage III Musculoskeletal Medical History: Positive for: 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 Past Medical History Notes: arthritis Neurologic Medical History: Positive for: Neuropathy HBO Extended History  Items Eyes: Cataracts Immunizations Pneumococcal Vaccine: Received Pneumococcal Vaccination: No Implantable Devices None Hospitalization / Surgery History Type of Hospitalization/Surgery Abdominal aortogram w/lower extremity Amputation toe (Right) Colonoscopy Elbow surgery Laparoscopic hysterectomy Weekly, Masa [name] ([id number]) [id number] Page 23 of 69 Family and Social History Cancer: Yes - Siblings; Diabetes: No; Heart Disease: Yes; Stroke: Yes - Mother; Former smoker - quit 2003; Marital Status - Widowed; Alcohol Use: Never; Drug Use: No History; Caffeine Use: Never; Financial Concerns: No; Food, Clothing or Shelter Needs: No; Support System Lacking: No; Transportation Concerns: No Electronic Signature(s) Signed: 05/21/2023 4:16:21 PM By: Zenaida Deed RN, BSN Signed: 05/21/2023 4:31:02 PM By: Duanne Guess MD FACS Entered By: Duanne Guess on 05/21/2023 16:16:03 -------------------------------------------------------------------------------- SuperBill Details Patient Name: Date of Service: [name]. 05/21/2023 Medical Record Number: [id number] Patient Account Number: [id number] Date of Birth/Sex: Treating RN: 07-04-1928 (89 y.o. 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 Secured With: 12M Medipore H Soft Cloth Surgical T ape, 4 x 10 (in/yd) (Generic) 1 x Per Day/30 Days Discharge Instructions: Secure with tape as directed. 05/21/2023: No real change to any of the wounds. She saw Dr. Sherral Hammers in vascular surgery on Tuesday and they discussed the fact that her wounds are not really progressing. He is reluctant, based upon his note, to be particularly aggressive in this fragile 89 year old, which is reasonable. I used a curette to debride slough and eschar from the majority of the wounds; I also debrided slough and  subcutaneous tissue from the heel ulcer and slough and tendon from the dorsal left great toe ulcer. We will continue Prisma silver collagen to all of the wound sites. Follow-up in 1 week. Electronic Signature(s) Signed: 05/21/2023 4:23:04 PM By: Duanne Guess MD FACS Previous Signature: 05/21/2023 4:20:03 PM Version By: Duanne Guess MD FACS Entered By: Duanne Guess on 05/21/2023 16:23:04 -------------------------------------------------------------------------------- HxROS Details Patient Name: Date of Service: [name]. 05/21/2023 2:45 PM Medical Record Number: [id number] Patient Account Number: [id number] Date of Birth/Sex: Treating RN: [birth year] (89 y.o. [name] Primary Care Provider: [name] Other Clinician: Referring Provider: Treating Provider/Extender: Claudie Leach Weeks in Treatment: [address] [name] ([id number]) [id number] Page 16 of 18 Information Obtained From Patient Caregiver Chart Eyes Medical History: Positive for: Cataracts Hematologic/Lymphatic Medical History: Positive for: Anemia Past Medical History Notes: Thrombocytopenia, Anticoagulant long-term use Respiratory Medical History: Negative for: Chronic Obstructive Pulmonary Disease (COPD) Past Medical History Notes: Pulmonary embolus Cardiovascular Medical History: Positive for: Arrhythmia - a-fib; Congestive Heart Failure; Hypotension; Peripheral Venous Disease Gastrointestinal Medical History: Past Medical History Notes: Hiatal hernia Endocrine Medical History: Past Medical History Notes: Hyperthyroidism, Hypothyroidism Genitourinary Medical History: Past Medical History Notes: CKD stage III Musculoskeletal Medical History: Positive for: 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 Past Medical History Notes: arthritis Neurologic Medical History: Positive for: Neuropathy HBO Extended History  Items Eyes: Cataracts Immunizations Pneumococcal Vaccine: Received Pneumococcal Vaccination: No Implantable Devices None Hospitalization / Surgery History Type of Hospitalization/Surgery Abdominal aortogram w/lower extremity Amputation toe (Right) Colonoscopy Elbow surgery Laparoscopic hysterectomy Weekly, Masa [name] ([id number]) [id number] Page 23 of 69 Family and Social History Cancer: Yes - Siblings; Diabetes: No; Heart Disease: Yes; Stroke: Yes - Mother; Former smoker - quit 2003; Marital Status - Widowed; Alcohol Use: Never; Drug Use: No History; Caffeine Use: Never; Financial Concerns: No; Food, Clothing or Shelter Needs: No; Support System Lacking: No; Transportation Concerns: No Electronic Signature(s) Signed: 05/21/2023 4:16:21 PM By: Zenaida Deed RN, BSN Signed: 05/21/2023 4:31:02 PM By: Duanne Guess MD FACS Entered By: Duanne Guess on 05/21/2023 16:16:03 -------------------------------------------------------------------------------- SuperBill Details Patient Name: Date of Service: [name]. 05/21/2023 Medical Record Number: [id number] Patient Account Number: [id number] Date of Birth/Sex: Treating RN: 07-04-1928 (89 y.o. 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

## 2023-05-23 ENCOUNTER — Encounter (INDEPENDENT_AMBULATORY_CARE_PROVIDER_SITE_OTHER): Payer: Self-pay | Admitting: Ophthalmology

## 2023-05-28 ENCOUNTER — Encounter (HOSPITAL_BASED_OUTPATIENT_CLINIC_OR_DEPARTMENT_OTHER): Payer: Medicare HMO | Admitting: General Surgery

## 2023-05-28 DIAGNOSIS — L89623 Pressure ulcer of left heel, stage 3: Secondary | ICD-10-CM | POA: Diagnosis not present

## 2023-05-28 NOTE — Progress Notes (Addendum)
[name], [name] ([id number]) [id number] Page 1 of 17 Visit Report for 05/28/2023 Chief Complaint Document Details Patient Name: Date of Service: [name], [name]. 05/28/2023 2:00 PM Medical Record Number: [id number] Patient Account Number: [id number] Date of Birth/Sex: Treating RN: [birth year] (89 y.o. F) Primary Care Provider: [name] Other Clinician: Referring Provider: Treating Provider/Extender: Tiajuana Amass in Treatment: 11 Information Obtained from: Patient Chief Complaint 01/28/2022; right second toe amputation site dehiscence and bilateral lower extremity wounds. 03/10/2023: pressure ulcers of right heel, ulcers on toes Electronic Signature(s) Signed: 05/28/2023 3:20:01 PM By: Duanne Guess MD FACS Entered By: Duanne Guess on 05/28/2023 15:20:01 -------------------------------------------------------------------------------- Debridement Details Patient Name: Date of Service: [name] 05/28/2023 2:00 PM Medical Record Number: [id number] Patient Account Number: [id number] Date of Birth/Sex: Treating RN: [birth year] (89 y.o. [name] Primary Care Provider: [name] Other Clinician: Referring Provider: Treating Provider/Extender: Claudie Leach Weeks in Treatment: 11 Debridement Performed for Assessment: Wound #15 Right,Dorsal T Great oe Performed By: Physician Duanne Guess, MD The following information was scribed by: Zenaida Deed The information was scribed for: Duanne Guess Debridement Type: Debridement Severity of Tissue Pre Debridement: Fat layer exposed Level of Consciousness (Pre-procedure): Awake and Alert Pre-procedure Verification/Time Out Yes - 14:45 Taken: Start Time: 14:45 Pain Control: Lidocaine 4% Topical Solution Percent of Wound Bed Debrided: 100% T Area Debrided (cm): otal 0.59 Tissue and other material debrided: Non-Viable, Eschar, Slough,  Slough Level: Non-Viable Tissue Debridement Description: Selective/Open Wound Instrument: Curette Bleeding: Minimum Hemostasis Achieved: Pressure Procedural Pain: 0 Post Procedural Pain: 0 Response to Treatment: Procedure was tolerated well Level of Consciousness (Post- Awake and Alert procedure): Post Debridement Measurements of Total Wound Length: (cm) 1.5 Width: (cm) 0.5 [name], [name] ([id number]) [id number] Page 2 of 17 Depth: (cm) 0.1 Volume: (cm) 0.059 Character of Wound/Ulcer Post Debridement: Stable Severity of Tissue Post Debridement: Fat layer exposed Post Procedure Diagnosis Same as Pre-procedure Electronic Signature(s) Signed: 05/28/2023 3:36:31 PM By: Zenaida Deed RN, BSN Signed: 05/28/2023 4:49:16 PM By: Duanne Guess MD FACS Entered By: Zenaida Deed on 05/28/2023 14:52:11 -------------------------------------------------------------------------------- Debridement Details Patient Name: Date of Service: [name] 05/28/2023 2:00 PM Medical Record Number: [id number] Patient Account Number: [id number] Date of Birth/Sex: Treating RN: [birth year] (89 y.o. [name] Primary Care Provider: [name] Other Clinician: Referring Provider: Treating Provider/Extender: Claudie Leach Weeks in Treatment: 11 Debridement Performed for Assessment: Wound #10 Left T Second oe Performed By: Physician Duanne Guess, MD The following information was scribed by: Zenaida Deed The information was scribed for: Duanne Guess Debridement Type: Debridement Level of Consciousness (Pre-procedure): Awake and Alert Pre-procedure Verification/Time Out Yes - 14:45 Taken: Start Time: 14:45 Pain Control: Lidocaine 4% T opical Solution Percent of Wound Bed Debrided: 100% T Area Debrided (cm): otal 0.16 Tissue and other material debrided: Non-Viable, Biofilm Level: Non-Viable Tissue Debridement Description:  Selective/Open Wound Instrument: Curette Bleeding: Minimum Hemostasis Achieved: Pressure Procedural Pain: 0 Post Procedural Pain: 0 Response to Treatment: Procedure was tolerated well Level of Consciousness (Post- Awake and Alert procedure): Post Debridement Measurements of Total Wound Length: (cm) 0.3 Stage: Category/Stage IV Width: (cm) 0.7 Depth: (cm) 0.1 Volume: (cm) 0.016 Character of Wound/Ulcer Post Debridement: Stable Post Procedure Diagnosis Same as Pre-procedure Electronic Signature(s) Signed: 05/28/2023 3:36:31 PM By: Zenaida Deed RN, BSN Signed: 05/28/2023 4:49:16 PM By: Duanne Guess MD FACS Entered By: Zenaida Deed on 05/28/2023 14:53:13 [name], [name] ([id number]) [id number] Page 3 of 17 -------------------------------------------------------------------------------- Debridement Details Patient  Name: Date of Service: ETTAMAE, SHEPPERD. 05/28/2023 2:00 PM Medical Record Number: [id number] Patient Account Number: [id number] Date of Birth/Sex: Treating RN: Nov 20, 1927 (89 y.o. [name] Primary Care Provider: [name] Other Clinician: Referring Provider: Treating Provider/Extender: Claudie Leach Weeks in Treatment: 11 Debridement Performed for Assessment: Wound #12 Left T Great oe Performed By: Physician Duanne Guess, MD The following information was scribed by: Zenaida Deed The information was scribed for: Duanne Guess Debridement Type: Debridement Severity of Tissue Pre Debridement: Fat layer exposed Level of Consciousness (Pre-procedure): Awake and Alert Pre-procedure Verification/Time Out Yes - 14:45 Taken: Start Time: 14:45 Pain Control: Lidocaine 4% Topical Solution Percent of Wound Bed Debrided: 100% T Area Debrided (cm): otal 0.03 Tissue and other material debrided: Non-Viable, Eschar, Slough, Slough Level: Non-Viable Tissue Debridement Description: Selective/Open  Wound Instrument: Curette Bleeding: Minimum Hemostasis Achieved: Pressure Procedural Pain: 0 Post Procedural Pain: 0 Response to Treatment: Procedure was tolerated well Level of Consciousness (Post- Awake and Alert procedure): Post Debridement Measurements of Total Wound Length: (cm) 0.2 Width: (cm) 0.2 Depth: (cm) 0.1 Volume: (cm) 0.003 Character of Wound/Ulcer Post Debridement: Stable Severity of Tissue Post Debridement: Fat layer exposed Post Procedure Diagnosis Same as Pre-procedure Electronic Signature(s) Signed: 05/28/2023 3:36:31 PM By: Zenaida Deed RN, BSN Signed: 05/28/2023 4:49:16 PM By: Duanne Guess MD FACS Entered By: Zenaida Deed on 05/28/2023 14:53:54 -------------------------------------------------------------------------------- Debridement Details Patient Name: Date of Service: [name] 05/28/2023 2:00 PM Medical Record Number: [id number] Patient Account Number: [id number] Date of Birth/Sex: Treating RN: [birth year] (89 y.o. [name] Primary Care Provider: [name] Other Clinician: Referring Provider: Treating Provider/Extender: Claudie Leach Weeks in Treatment: 11 Debridement Performed for Assessment: Wound #13 Left,Dorsal T Great oe Performed By: Physician Duanne Guess, MD The following information was scribed by: Zenaida Deed The information was scribed for: [name], [name] ([id number]) [id number] Page 4 of 17 Debridement Type: Debridement Severity of Tissue Pre Debridement: Necrosis of bone Level of Consciousness (Pre-procedure): Awake and Alert Pre-procedure Verification/Time Out Yes - 14:45 Taken: Start Time: 14:45 Pain Control: Lidocaine 4% T opical Solution Percent of Wound Bed Debrided: 100% T Area Debrided (cm): otal 1.18 Tissue and other material debrided: Viable, Non-Viable, Slough, T endon, Slough Level: Skin/Subcutaneous  Tissue/Muscle Debridement Description: Excisional Instrument: Curette, Forceps, Scissors Bleeding: Minimum Hemostasis Achieved: Pressure Procedural Pain: 0 Post Procedural Pain: 0 Response to Treatment: Procedure was tolerated well Level of Consciousness (Post- Awake and Alert procedure): Post Debridement Measurements of Total Wound Length: (cm) 1 Width: (cm) 1.5 Depth: (cm) 0.1 Volume: (cm) 0.118 Character of Wound/Ulcer Post Debridement: Stable Severity of Tissue Post Debridement: Bone involvement without necrosis Post Procedure Diagnosis Same as Pre-procedure Electronic Signature(s) Signed: 05/28/2023 3:36:31 PM By: Zenaida Deed RN, BSN Signed: 05/28/2023 4:49:16 PM By: Duanne Guess MD FACS Entered By: Zenaida Deed on 05/28/2023 14:54:43 -------------------------------------------------------------------------------- Debridement Details Patient Name: Date of Service: [name] 05/28/2023 2:00 PM Medical Record Number: [id number] Patient Account Number: [id number] Date of Birth/Sex: Treating RN: 1927/11/03 (89 y.o. F) Primary Care Provider: [name] Other Clinician: Referring Provider: Treating Provider/Extender: Claudie Leach Weeks in Treatment: 11 Debridement Performed for Assessment: Wound #9 Left Calcaneus Performed By: Physician Duanne Guess, MD The following information was scribed by: Zenaida Deed The information was scribed for: Duanne Guess Debridement Type: Debridement Level of Consciousness (Pre-procedure): Awake and Alert Pre-procedure Verification/Time Out Yes - 14:45 Taken: Start Time: 14:45 Pain Control: Lidocaine 4% T opical Solution Percent  of Wound Bed Debrided: 100% T Area Debrided (cm): otal 0.16 Tissue and other material debrided: Viable, Non-Viable, Slough, Subcutaneous, Slough Level: Skin/Subcutaneous Tissue Debridement Description: Excisional Instrument: Curette Bleeding: Minimum Hemostasis  Achieved: Pressure Procedural Pain: 0 Post Procedural Pain: 0 Response to Treatment: Procedure was tolerated well Level of Consciousness (Post- Awake and Alert procedure): [name], [name] ([id number]) [id number] Page 5 of 17 Post Debridement Measurements of Total Wound Length: (cm) 0.5 Stage: Category/Stage III Width: (cm) 0.4 Depth: (cm) 0.4 Volume: (cm) 0.063 Character of Wound/Ulcer Post Debridement: Stable Post Procedure Diagnosis Same as Pre-procedure Notes 07/28/2023: Late entry for date of service 05/28/2023: The procedure note was amended to reflect the actual stage of the calcaneal wound (stage III). Note: This amendment was made greater than 24 to 48 hours after the date of service. Electronic Signature(s) Signed: 07/28/2023 2:41:31 PM By: Duanne Guess MD FACS Previous Signature: 05/28/2023 3:36:31 PM Version By: Zenaida Deed RN, BSN Previous Signature: 05/28/2023 4:49:16 PM Version By: Duanne Guess MD FACS Entered By: Duanne Guess on 07/28/2023 14:41:30 -------------------------------------------------------------------------------- HPI Details Patient Name: Date of Service: Fawver, HA TTIE [name]. 05/28/2023 2:00 PM Medical Record Number: [id number] Patient Account Number: [id number] Date of Birth/Sex: Treating RN: 15-Jan-1928 (89 y.o. F) Primary Care Provider: [name] Other Clinician: Referring Provider: Treating Provider/Extender: Claudie Leach Weeks in Treatment: 11 History of Present Illness HPI Description: Admission 01/28/2022 Ms. 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 She states she has tried Medihoney and silver alginate in the  past to this area with little benefit. She also has 2 small areas limited to skin breakdown to her lower extremities bilaterally. She has chronic venous insufficiency but not has not been wearing her compression stockings. She states she bumped her legs against an object and not so the wound started. She has been using Medihoney to the sites. She denies signs of infection. 6/2; patient presents for follow-up. She had an x-ray of her right foot done at last clinic visit and this was negative for evidence of osteomyelitis. She also had a wound culture done that showed extra high levels of Staph aureus. I recommended Keystone antibiotics for this and this was ordered. She had ABIs with TBI's done as well that showed monophasic waveforms to the right foot with TBI of 0 and an ABI of 0.52. Urgent referral was made to vein and vascular and she saw Dr. Durwin Nora on 6/1, yesterday and he recommended an arteriogram. This is scheduled for 6/16. Patient also reports a new wound to the right great toe. This is a blister that has ruptured. She also reports increased erythema to the toe. 6/6; the patient was worked in urgently today at the insistence of her daughter out of concern for a new wound on the lateral part of the plantar right great toe. She has her original postsurgical wound after the amputation of the right second toe, she has a wound on the medial part of the right great toe. The patient is apparently going for an angiogram by Dr. Durwin Nora in 2 weeks time. 6/13; patient presents for follow-up. She has been using bacitracin to the abrasion on the right great toe. She has been using collagen and Keystone antibiotic to the amputation site.  She has no issues or complaints today. She denies signs of infection. 6/22; patient presents for follow-up. She states that her abdominal aortogram was canceled due to her renal function. She has been using Keystone antibiotics to the amputation site and Medihoney to the right  great toe wound. At the pace of the right great toe she has a slitlike open area that she thinks was caused by the tape from the dressing. 6/29; patient presents for follow-up. She has been using Keystone antibiotics to the amputation site along with collagen. She has been using Medihoney to the right great toe wound. She has no other wounds. She denies signs of infection. 7/13; patient presents for follow-up. She has been using Keystone antibiotics and collagen to the wound sites. She currently denies signs of infection. She states she is scheduled to see me nephrology next month. 7/20; patient presents for follow-up. She continue Keystone antibiotics and collagen to the wound sites. She has no issues or complaints today. 8/1; patient presents for follow up. She continues to use keystone antibiotics and collagen to the wound sites. She has no issues or complaints today. 8/15; patient presents for follow-up. She has been using Keystone antibiotics and collagen to the wound sites. She followed up with her nephrologist who made medication changes. She is supposed to get a repeat BMP in 2 weeks. Her decrease in renal function was a limiting factor in obtaining an arteriogram for potential intervention for revascularization. She currently denies signs of infection. 9/2; patient presents for follow-up. She has been using Keystone antibiotic and collagen to the wound beds. She has no issues or complaints today. Reports there has been improvement in kidney function however not cleared to have her arteriogram just yet. MALENA, SHIEH ([id number]) [id number] Page 6 of 17 10/10; patient presents for follow-up. She has been using Keystone antibiotic and collagen to the wound beds. She reports 2 new wounds 1 to the anterior right lower extremity and another to the plantar aspect of the right foot. She states that the right plantar foot wound was caused by the home health nurse  changing the dressing and causing a skin tear. She is not sure how the right anterior leg wound started. It appears to be from trauma. She denies signs of infection. 10/19; patient presents for follow-up. She has been using Keystone antibiotic and collagen to the right great toe wound. She is been using silver alginate to the right anterior and right plantar foot wound. She has been using Tubigrip to the right lower extremity. The plantar foot wound has healed. She has no issues or complaints today. 11/3; since the patient was last here she was seen in urgent care apparently for an area on the dorsal aspect of the right fifth toe perhaps over the PIP. I saw a picture of this on the daughter's cell phone. There was slough on this. Urgent care gave him doxycycline. She is also changed the dressing to all wounds back the [facility] and collagen which includes her right leg and left first toe 11/16; patient presents for follow-up. She has a new wound to the left knee. She states she fell. She has been using antibiotic ointment and collagen to this area. She has been using collagen and Keystone antibiotic to the right great toe wound. The anterior right leg wound is healed. She denies signs of infection. 11/30; patient presents for follow-up. The right great toe wound has healed. She has 1 remaining wound to the left knee. She has  been using Hydrofera Blue and Medihoney here. 12/19; patient presents for follow-up. Her left knee wound has healed. She has no issues or complaints today. 09/30/2022 Patient's daughter called for an appointment due to increased swelling to her lower extremities bilaterally. Today patient presents with increased swelling to her lower extremities although there is no increased redness or warmth. She was evaluated by her nephrologist who increased her diuretics. Per patient and daughter her swelling has gone down to the leg Over the past several days. She does not have any open  wounds. She was advised to elevate her legs and not consume excess salt By her PCP and nephrologist. Patient has compression stockings that she has been using sporadically. READMISSION 03/10/2023 Since her last visit to the clinic, the patient has been hospitalized at least twice, in February with COVID pneumonia and CHF exacerbation. She was discharged to a skilled nursing facility and then readmitted in April with fevers. She was noted to have pressure ulcers on her heels and sacrum. Per family declined skilled nursing placement upon discharge and she has apparently been residing at home. She has followed with the vascular surgery clinic regarding peripheral artery disease and due to ulcerations on her feet, she ultimately underwent arteriography with balloon angioplasties of the superficial femoral artery, popliteal artery and peroneal artery on the left, along with shockwave ultrasound assisted balloon angioplasty of the popliteal artery and distal SFA. Given her multiple significant medical comorbidities, she is not a candidate for open surgery. She is deemed to be maximally vascularized this procedure, which was performed on 04 March 2023. T oday, she has a stage III pressure ulcer on her left heel and wounds on the PIP joints of her right third and left second toes. No sacral wound is present and the ulcer on her left heel has closed. 03/18/2023: She has a new ulcer on her left great toe. It was just noticed yesterday and the etiology is unknown. The fat layer is exposed and there is some slough accumulation. The other wounds are all essentially unchanged in size. There is a little bit more granulation tissue on the other toe wounds, but bone is still frankly exposed on both sides. The heel ulcer has thick slough accumulation. 03/26/2023: She has a new wound on the tip of her left third toe. It is black. Neither the patient nor her daughter are aware of how it began. The other wounds  are stable. 03/31/2023: The culture that I took from her left third toe returned with MRSA. We contacted the patient's daughter to have her stop levofloxacin and start doxycycline, but she has not yet picked up the new antibiotic. All of her wounds have accumulated slough. Bone remains exposed. She is scheduled to follow- up with vascular surgery tomorrow to evaluate the patency of her revascularization. 04/09/2023: She saw her vascular surgeon and her ABIs are improved; her TBI's could not be checked due to her bandages. She did finally start taking the prescribed doxycycline. All of her wounds look about the same today. 04/23/2023: No significant changes to any of her wounds aside from being slightly smaller other than the left dorsal great toe wound, which is stable. She has accumulated slough and eschar on all of the surfaces. 04/30/2023: All of the wounds are stable with the exception of the left dorsal great toe wound which now has tendon exposed in addition to bone. There is slough and eschar accumulation on all sites. 05/15/2023: The patient was in the hospital and missed  her appointment last week. The wound at the tip of her left third toe is healed. Everything else looks the same. 05/21/2023: No real change to any of the wounds. 05/28/2023: Once again, there has been no real change to any of her wounds except that the wound at the tip of her left great toe is closed. Electronic Signature(s) Signed: 05/28/2023 3:33:12 PM By: Duanne Guess MD FACS Entered By: Duanne Guess on 05/28/2023 15:33:12 -------------------------------------------------------------------------------- Physical Exam Details Patient Name: Date of Service: Mash, [name]. 05/28/2023 2:00 PM Medical Record Number: [id number] Patient Account Number: [id number] Date of Birth/Sex: Treating RN: Mar 27, 1928 (89 y.o. F) Primary Care Provider: [name] Other Clinician: Referring Provider: Treating Provider/Extender:  Claudie Leach Weeks in Treatment: [address], [name] ([id number]) [id number] Page 7 of 17 Constitutional . . . . no acute distress. Respiratory Normal work of breathing on supplemental oxygen. Notes 05/28/2023: Once again, there has been no real change to any of her wounds except that the wound at the tip of her left great toe is closed. Electronic Signature(s) Signed: 05/28/2023 3:41:46 PM By: Duanne Guess MD FACS Previous Signature: 05/28/2023 3:34:29 PM Version By: Duanne Guess MD FACS Entered By: Duanne Guess on 05/28/2023 15:41:45 -------------------------------------------------------------------------------- Physician Orders Details Patient Name: Date of Service: [name] 05/28/2023 2:00 PM Medical Record Number: [id number] Patient Account Number: [id number] Date of Birth/Sex: Treating RN: Jun 20, 1928 (89 y.o. 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 - Dr. Lady Gary - room 1 Return A Thurs 10/3@ 2:45 pm Anesthetic (In  clinic) Topical Lidocaine 4% applied to wound bed Bathing/ Shower/ Hygiene May shower and wash wound with soap and water. Edema Control - Lymphedema / SCD / Other Elevate legs to the level of the heart or above for 30 minutes daily and/or when sitting for 3-4 times a day throughout the day. Avoid standing for long periods of time. Patient to wear own compression stockings every day. Moisturize legs daily. - and feet Additional Orders / Instructions Follow Nutritious Diet - add in protein shakes every day to diet - recommend premier protein 500 mg x3 a day vitamin C, zinc 30-50 mg per day Home Health No change in wound care orders this week; continue Home Health for wound care. May utilize formulary equivalent dressing for wound treatment orders unless otherwise specified. Other Home Health Orders/Instructions: - [facility] Home Wound Treatment Wound #10 - T Second oe Wound Laterality: Left [name], [name] ([id number]) [id number] Page 8 of 17 Cleanser: Soap and Water 1 x Per Day/30 Days Discharge Instructions: May shower and wash wound with dial antibacterial soap and water prior to dressing change. Cleanser: Wound Cleanser 1 x Per Day/30 Days Discharge Instructions: Cleanse the wound with wound cleanser prior to applying a clean dressing  using gauze sponges, not tissue or cotton balls. Peri-Wound Care: Sween Lotion (Moisturizing lotion) 1 x Per Day/30 Days Discharge Instructions: Apply moisturizing lotion as directed Prim Dressing: Promogran Prisma Matrix, 4.34 (sq in) (silver collagen) (Generic) 1 x Per Day/30 Days ary Discharge Instructions: Moisten collagen with saline or hydrogel Secondary Dressing: Woven Gauze Sponge, Non-Sterile 4x4 in (Generic) 1 x Per Day/30 Days Discharge Instructions: Apply over primary dressing as directed. Secured With: [employer], 4.5x3.1 (in/yd) 1 x Per Day/30 Days Discharge Instructions: Secure with Kerlix as  directed. Secured With: 25M Medipore H Soft Cloth Surgical T ape, 4 x 10 (in/yd) (Generic) 1 x Per Day/30 Days Discharge Instructions: Secure with tape as directed. Wound #11 - T Second oe Wound Laterality: Right Cleanser: Soap and Water 1 x Per Day/30 Days Discharge Instructions: May shower and wash wound with dial antibacterial soap and water prior to dressing change. Cleanser: Wound Cleanser 1 x Per Day/30 Days Discharge Instructions: Cleanse the wound with wound cleanser prior to applying a clean dressing using gauze sponges, not tissue or cotton balls. Prim Dressing: Promogran Prisma Matrix, 4.34 (sq in) (silver collagen) (Generic) 1 x Per Day/30 Days ary Discharge Instructions: Moisten collagen with saline or hydrogel Secondary Dressing: Woven Gauze Sponge, Non-Sterile 4x4 in (Generic) 1 x Per Day/30 Days Discharge Instructions: Apply over primary dressing as directed. Secured With: Insurance underwriter, Sterile 2x75 (in/in) 1 x Per Day/30 Days Discharge Instructions: Secure with stretch gauze as directed. Secured With: 25M Medipore H Soft Cloth Surgical T ape, 4 x 10 (in/yd) (Generic) 1 x Per Day/30 Days Discharge Instructions: Secure with tape as directed. Wound #12 - T Great oe Wound Laterality: Left Cleanser: Soap and Water 1 x Per Day/30 Days Discharge Instructions: May shower and wash wound with dial antibacterial soap and water prior to dressing change. Cleanser: Wound Cleanser 1 x Per Day/30 Days Discharge Instructions: Cleanse the wound with wound cleanser prior to applying a clean dressing using gauze sponges, not tissue or cotton balls. Prim Dressing: Promogran Prisma Matrix, 4.34 (sq in) (silver collagen) (Generic) 1 x Per Day/30 Days ary Discharge Instructions: Moisten collagen with saline or hydrogel Secondary Dressing: Woven Gauze Sponge, Non-Sterile 4x4 in (Generic) 1 x Per Day/30 Days Discharge Instructions: Apply over primary dressing as  directed. Secured With: 25M Medipore H Soft Cloth Surgical T ape, 4 x 10 (in/yd) (Generic) 1 x Per Day/30 Days Discharge Instructions: Secure with tape as directed. Wound #13 - T Great oe Wound Laterality: Dorsal, Left Cleanser: Soap and Water 1 x Per Day/30 Days Discharge Instructions: May shower and wash wound with dial antibacterial soap and water prior to dressing change. Cleanser: Wound Cleanser 1 x Per Day/30 Days Discharge Instructions: Cleanse the wound with wound cleanser prior to applying a clean dressing using gauze sponges, not tissue or cotton balls. Prim Dressing: Promogran Prisma Matrix, 4.34 (sq in) (silver collagen) (Generic) 1 x Per Day/30 Days ary Discharge Instructions: Moisten collagen with saline or hydrogel Secondary Dressing: Woven Gauze Sponge, Non-Sterile 4x4 in (Generic) 1 x Per Day/30 Days Discharge Instructions: Apply over primary dressing as directed. Secured With: 25M Medipore H Soft Cloth Surgical T ape, 4 x 10 (in/yd) (Generic) 1 x Per Day/30 Days Discharge Instructions: Secure with tape as directed. Wound #15 - T Great oe Wound Laterality: Dorsal, Right Cleanser: Soap and Water 1 x Per Day/30 Days Discharge Instructions: May shower and wash wound with dial antibacterial soap and water prior to dressing change. [name], [name] (  [id number]) [id number] Page 9 of 17 Cleanser: Wound Cleanser 1 x Per Day/30 Days Discharge Instructions: Cleanse the wound with wound cleanser prior to applying a clean dressing using gauze sponges, not tissue or cotton balls. Prim Dressing: Promogran Prisma Matrix, 4.34 (sq in) (silver collagen) (Generic) 1 x Per Day/30 Days ary Discharge Instructions: Moisten collagen with saline or hydrogel Secondary Dressing: Woven Gauze Sponge, Non-Sterile 4x4 in (Generic) 1 x Per Day/30 Days Discharge Instructions: Apply over primary dressing as directed. Secured With: Insurance underwriter, Sterile 2x75  (in/in) 1 x Per Day/30 Days Discharge Instructions: Secure with stretch gauze as directed. Secured With: 32M Medipore H Soft Cloth Surgical T ape, 4 x 10 (in/yd) (Generic) 1 x Per Day/30 Days Discharge Instructions: Secure with tape as directed. Wound #9 - Calcaneus Wound Laterality: Left Cleanser: Soap and Water 1 x Per Day/30 Days Discharge Instructions: May shower and wash wound with dial antibacterial soap and water prior to dressing change. Cleanser: Wound Cleanser 1 x Per Day/30 Days Discharge Instructions: Cleanse the wound with wound cleanser prior to applying a clean dressing using gauze sponges, not tissue or cotton balls. Prim Dressing: Promogran Prisma Matrix, 4.34 (sq in) (silver collagen) (Generic) 1 x Per Day/30 Days ary Discharge Instructions: Moisten collagen with saline or hydrogel Secondary Dressing: ALLEVYN Heel 4 1/2in x 5 1/2in / 10.5cm x 13.5cm (Generic) 1 x Per Day/30 Days Discharge Instructions: Apply over primary dressing as directed. Secondary Dressing: Woven Gauze Sponge, Non-Sterile 4x4 in (Generic) 1 x Per Day/30 Days Discharge Instructions: Apply over primary dressing as directed. Secured With: [employer], 4.5x3.1 (in/yd) 1 x Per Day/30 Days Discharge Instructions: Secure with Kerlix as directed. Secured With: 32M Medipore H Soft Cloth Surgical T ape, 4 x 10 (in/yd) (Generic) 1 x Per Day/30 Days Discharge Instructions: Secure with tape as directed. Electronic Signature(s) Signed: 05/28/2023 3:36:31 PM By: Zenaida Deed RN, BSN Signed: 05/28/2023 4:49:16 PM By: Duanne Guess MD FACS Entered By: Zenaida Deed on 05/28/2023 15:02:06 -------------------------------------------------------------------------------- Problem List Details Patient Name: Date of Service: [name] 05/28/2023 2:00 PM Medical Record Number: [id number] Patient Account Number: [id number] Date of Birth/Sex: Treating RN: 1927/12/16 (89 y.o. 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 03/10/2023 No Yes without evidence of necrosis [name], [name] ([id number]) [id number] Page 10 of 17 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 03/10/2023 No Yes without evidence of necrosis L97.522 Non-pressure chronic ulcer of other part of left foot with fat layer exposed 03/18/2023 No Yes I73.9 Peripheral vascular disease, unspecified 03/10/2023 No Yes I50.32 Chronic diastolic (congestive) heart failure 03/10/2023 No Yes N18.32 Chronic kidney disease, stage 3b 03/10/2023 No Yes I87.2 Venous insufficiency (chronic) (peripheral) 03/10/2023 No Yes Inactive Problems Resolved Problems Electronic Signature(s) Signed: 05/28/2023 3:19:15 PM By: Duanne Guess MD FACS Entered By: Duanne Guess on 05/28/2023 15:19:15 -------------------------------------------------------------------------------- Progress Note Details Patient Name: Date of Service: [name] 05/28/2023 2:00 PM Medical Record Number: [id number] Patient Account Number: [id number] Date of Birth/Sex: Treating RN: [birth year] (89 y.o. F) Primary Care Provider: [name] Other Clinician: Referring Provider: Treating Provider/Extender: Tiajuana Amass in Treatment: 11 Subjective Chief Complaint Information obtained from Patient  01/28/2022; right second toe amputation site dehiscence and bilateral lower extremity wounds. 03/10/2023: pressure ulcers of right heel, ulcers on toes History of Present Illness (HPI) Admission 01/28/2022 Ms. 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 She states she has tried Medihoney and silver alginate in the past to this area with little benefit. She also has 2 small areas limited to skin breakdown to her lower extremities bilaterally. She has chronic venous insufficiency but not has not been wearing her compression stockings. She states she bumped her legs against an object and not so the wound started. She has been using Medihoney to the sites. She denies signs of infection. 6/2; patient presents for follow-up. She had an x-ray of her right foot done at last clinic visit and this was negative for evidence of osteomyelitis. She also had a wound culture done that showed extra high levels of Staph aureus. I recommended Keystone antibiotics for this and this was ordered. She had ABIs with TBI's done as well that showed monophasic waveforms to the right foot with TBI of 0 and an ABI of 0.52. Urgent referral was made to vein and vascular and she saw Dr. Durwin Nora on 6/1, yesterday and he recommended an arteriogram. This is scheduled for 6/16. Patient also reports a new wound to the right great toe. This is a blister that has ruptured. She also reports increased erythema to the toe. 6/6; the patient was worked in urgently today at the insistence of her daughter out of concern for a new wound on the lateral part of the plantar right great toe. She has her original postsurgical wound after the amputation of the right second toe, she has a wound on the medial part of the right great toe. The patient is apparently going for an angiogram by Dr. Durwin Nora in 2 weeks time. 6/13; patient presents for follow-up. She has been using bacitracin to the abrasion on the right great toe. She has been  using collagen and Keystone antibiotic MARINAH, LOVEL ([id number]) [id number] Page 11 of 17 to the amputation site. She has no issues or complaints today. She denies signs of infection. 6/22; patient presents for follow-up. She states that her abdominal aortogram was canceled due to her renal function. She has been using Keystone antibiotics to the amputation site and Medihoney to the right great toe wound. At the pace of the right great toe she has a slitlike open area that she thinks was caused by the tape from the dressing. 6/29; patient presents for follow-up. She has been using Keystone antibiotics to the amputation site along with collagen. She has been using Medihoney to the right great toe wound. She has no other wounds. She denies signs of infection. 7/13; patient presents for follow-up. She has been using Keystone antibiotics and collagen to the wound sites. She currently denies signs of infection. She states she is scheduled to see me nephrology next month. 7/20; patient presents for follow-up. She continue Keystone antibiotics and collagen to the wound sites. She has no issues or complaints today. 8/1; patient presents for follow up. She continues to use keystone antibiotics and collagen to the wound sites. She has no issues or complaints today. 8/15; patient presents for follow-up. She has been  using Keystone antibiotics and collagen to the wound sites. She followed up with her nephrologist who made medication changes. She is supposed to get a repeat BMP in 2 weeks. Her decrease in renal function was a limiting factor in obtaining an arteriogram for potential intervention for revascularization. She currently denies signs of infection. 9/2; patient presents for follow-up. She has been using Keystone antibiotic and collagen to the wound beds. She has no issues or complaints today. Reports there has been improvement in kidney function however not cleared to have  her arteriogram just yet. 10/10; patient presents for follow-up. She has been using Keystone antibiotic and collagen to the wound beds. She reports 2 new wounds 1 to the anterior right lower extremity and another to the plantar aspect of the right foot. She states that the right plantar foot wound was caused by the home health nurse changing the dressing and causing a skin tear. She is not sure how the right anterior leg wound started. It appears to be from trauma. She denies signs of infection. 10/19; patient presents for follow-up. She has been using Keystone antibiotic and collagen to the right great toe wound. She is been using silver alginate to the right anterior and right plantar foot wound. She has been using Tubigrip to the right lower extremity. The plantar foot wound has healed. She has no issues or complaints today. 11/3; since the patient was last here she was seen in urgent care apparently for an area on the dorsal aspect of the right fifth toe perhaps over the PIP. I saw a picture of this on the daughter's cell phone. There was slough on this. Urgent care gave him doxycycline. She is also changed the dressing to all wounds back the [facility] and collagen which includes her right leg and left first toe 11/16; patient presents for follow-up. She has a new wound to the left knee. She states she fell. She has been using antibiotic ointment and collagen to this area. She has been using collagen and Keystone antibiotic to the right great toe wound. The anterior right leg wound is healed. She denies signs of infection. 11/30; patient presents for follow-up. The right great toe wound has healed. She has 1 remaining wound to the left knee. She has been using Hydrofera Blue and Medihoney here. 12/19; patient presents for follow-up. Her left knee wound has healed. She has no issues or complaints today. 09/30/2022 Patient's daughter called for an appointment due to increased swelling to her lower  extremities bilaterally. Today patient presents with increased swelling to her lower extremities although there is no increased redness or warmth. She was evaluated by her nephrologist who increased her diuretics. Per patient and daughter her swelling has gone down to the leg Over the past several days. She does not have any open wounds. She was advised to elevate her legs and not consume excess salt By her PCP and nephrologist. Patient has compression stockings that she has been using sporadically. READMISSION 03/10/2023 Since her last visit to the clinic, the patient has been hospitalized at least twice, in February with COVID pneumonia and CHF exacerbation. She was discharged to a skilled nursing facility and then readmitted in April with fevers. She was noted to have pressure ulcers on her heels and sacrum. Per family declined skilled nursing placement upon discharge and she has apparently been residing at home. She has followed with the vascular surgery clinic regarding peripheral artery disease and due to ulcerations on her feet, she ultimately underwent  arteriography with balloon angioplasties of the superficial femoral artery, popliteal artery and peroneal artery on the left, along with shockwave ultrasound assisted balloon angioplasty of the popliteal artery and distal SFA. Given her multiple significant medical comorbidities, she is not a candidate for open surgery. She is deemed to be maximally vascularized this procedure, which was performed on 04 March 2023. T oday, she has a stage III pressure ulcer on her left heel and wounds on the PIP joints of her right third and left second toes. No sacral wound is present and the ulcer on her left heel has closed. 03/18/2023: She has a new ulcer on her left great toe. It was just noticed yesterday and the etiology is unknown. The fat layer is exposed and there is some slough accumulation. The other wounds are all essentially unchanged in size. There is  a little bit more granulation tissue on the other toe wounds, but bone is still frankly exposed on both sides. The heel ulcer has thick slough accumulation. 03/26/2023: She has a new wound on the tip of her left third toe. It is black. Neither the patient nor her daughter are aware of how it began. The other wounds are stable. 03/31/2023: The culture that I took from her left third toe returned with MRSA. We contacted the patient's daughter to have her stop levofloxacin and start doxycycline, but she has not yet picked up the new antibiotic. All of her wounds have accumulated slough. Bone remains exposed. She is scheduled to follow- up with vascular surgery tomorrow to evaluate the patency of her revascularization. 04/09/2023: She saw her vascular surgeon and her ABIs are improved; her TBI's could not be checked due to her bandages. She did finally start taking the prescribed doxycycline. All of her wounds look about the same today. 04/23/2023: No significant changes to any of her wounds aside from being slightly smaller other than the left dorsal great toe wound, which is stable. She has accumulated slough and eschar on all of the surfaces. 04/30/2023: All of the wounds are stable with the exception of the left dorsal great toe wound which now has tendon exposed in addition to bone. There is slough and eschar accumulation on all sites. 05/15/2023: The patient was in the hospital and missed her appointment last week. The wound at the tip of her left third toe is healed. Everything else looks the same. 05/21/2023: No real change to any of the wounds. 05/28/2023: Once again, there has been no real change to any of her wounds except that the wound at the tip of her left great toe is closed. Patient History Information obtained from Patient, Caregiver, Chart. Family History [name], BRYON ([id number]) [id number] Page 12 of 17 Cancer - Siblings, Heart Disease, Stroke - Mother, No  family history of Diabetes. Social History Former smoker - quit 2003, Marital Status - Widowed, Alcohol Use - Never, Drug Use - No History, Caffeine Use - Never. Medical History Eyes Patient has history of Cataracts Hematologic/Lymphatic Patient has history of Anemia Respiratory Denies history of Chronic Obstructive Pulmonary Disease (COPD) Cardiovascular Patient has history of Arrhythmia - a-fib, Congestive Heart Failure, Hypotension, Peripheral Venous Disease Musculoskeletal Patient has history of 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 Neurologic Patient has history of Neuropathy Hospitalization/Surgery History - Abdominal aortogram w/lower extremity. - Amputation toe (Right). - Colonoscopy. - Elbow surgery. - Laparoscopic hysterectomy. Medical A Surgical History Notes nd Hematologic/Lymphatic Thrombocytopenia, Anticoagulant long-term use Respiratory Pulmonary embolus Gastrointestinal Hiatal hernia Endocrine Hyperthyroidism, Hypothyroidism Genitourinary  CKD stage III Musculoskeletal arthritis Objective Constitutional no acute distress. Vitals Time Taken: 2:16 AM, Height: 64 in, Weight: 157 lbs, BMI: 26.9, Temperature: 98.3 F, Pulse: 65 bpm, Respiratory Rate: 18 breaths/min, Blood Pressure: 122/57 mmHg. Respiratory Normal work of breathing on supplemental oxygen. General Notes: 05/28/2023: Once again, there has been no real change to any of her wounds except that the wound at the tip of her left great toe is closed. Integumentary (Hair, Skin) Wound #10 status is Open. Original cause of wound was Pressure Injury. The date acquired was: 10/09/2022. The wound has been in treatment 11 weeks. The wound is located on the Left T Second. The wound measures 0.3cm length x 0.7cm width x 0.1cm depth; 0.165cm^2 area and 0.016cm^3 volume. There is oe bone, joint, and Fat Layer (Subcutaneous Tissue) exposed. There is no tunneling or undermining noted. There is a small amount of  serosanguineous drainage noted. The wound margin is distinct with the outline attached to the wound base. There is large (67-100%) red granulation within the wound bed. There is a small (1-33%) amount of necrotic tissue within the wound bed including Adherent Slough. The periwound skin appearance had no abnormalities noted for texture. The periwound skin appearance had no abnormalities noted for moisture. The periwound skin appearance had no abnormalities noted for color. Periwound temperature was noted as No Abnormality. Wound #11 status is Open. Original cause of wound was Pressure Injury. The date acquired was: 10/09/2022. The wound has been in treatment 11 weeks. The wound is located on the Right T Second. The wound measures 0.5cm length x 0.6cm width x 0.1cm depth; 0.236cm^2 area and 0.024cm^3 volume. There is oe bone, joint, and Fat Layer (Subcutaneous Tissue) exposed. There is no tunneling or undermining noted. There is a medium amount of serosanguineous drainage noted. The wound margin is distinct with the outline attached to the wound base. There is medium (34-66%) red granulation within the wound bed. There is a medium (34-66%) amount of necrotic tissue within the wound bed including Adherent Slough. The periwound skin appearance had no abnormalities noted for texture. The periwound skin appearance had no abnormalities noted for moisture. The periwound skin appearance had no abnormalities noted for color. Periwound temperature was noted as No Abnormality. The periwound has tenderness on palpation. Wound #12 status is Open. Original cause of wound was Gradually Appeared. The date acquired was: 03/17/2023. The wound has been in treatment 10 weeks. The wound is located on the Left T Great. The wound measures 0.1cm length x 0.1cm width x 0.1cm depth; 0.008cm^2 area and 0.001cm^3 volume. There is oe Fat Layer (Subcutaneous Tissue) exposed. There is no tunneling or undermining noted. There is a none  present amount of drainage noted. The wound margin is flat and intact. There is no granulation within the wound bed. There is a large (67-100%) amount of necrotic tissue within the wound bed including Eschar. The periwound skin appearance had no abnormalities noted for texture. The periwound skin appearance had no abnormalities noted for moisture. The periwound skin appearance had no abnormalities noted for color. Periwound temperature was noted as No Abnormality. The periwound has tenderness on palpation. Wound #13 status is Open. Original cause of wound was Gradually Appeared. The date acquired was: 03/24/2023. The wound has been in treatment 9 weeks. The wound is located on the Left,Dorsal T Great. The wound measures 1cm length x 1.5cm width x 0.1cm depth; 1.178cm^2 area and 0.118cm^3 volume. oe There is bone, tendon, and Fat Layer (Subcutaneous Tissue) exposed.  There is no tunneling or undermining noted. There is a medium amount of serosanguineous drainage noted. The wound margin is distinct with the outline attached to the wound base. There is medium (34-66%) red granulation within the wound bed. There is a medium (34-66%) amount of necrotic tissue within the wound bed including Adherent Slough. The periwound skin appearance had no abnormalities noted for texture. The periwound skin appearance had no abnormalities noted for moisture. The periwound skin appearance had no abnormalities [name], [name] ([id number]) [id number] Page 13 of 17 noted for color. Periwound temperature was noted as No Abnormality. The periwound has tenderness on palpation. Wound #15 status is Open. Original cause of wound was Gradually Appeared. The date acquired was: 03/26/2023. The wound has been in treatment 9 weeks. The wound is located on the Right,Dorsal T [place]. The wound measures 0.3cm length x 0.3cm width x 0.1cm depth; 0.071cm^2 area and 0.007cm^3 volume. oe There is Fat Layer  (Subcutaneous Tissue) exposed. There is no tunneling or undermining noted. There is a medium amount of serosanguineous drainage noted. The wound margin is flat and intact. There is large (67-100%) red granulation within the wound bed. There is a small (1-33%) amount of necrotic tissue within the wound bed including Adherent Slough. The periwound skin appearance had no abnormalities noted for texture. The periwound skin appearance had no abnormalities noted for moisture. The periwound skin appearance had no abnormalities noted for color. Periwound temperature was noted as No Abnormality. The periwound has tenderness on palpation. Wound #9 status is Open. Original cause of wound was Pressure Injury. The date acquired was: 10/22/2022. The wound has been in treatment 11 weeks. The wound is located on the Left Calcaneus. The wound measures 0.5cm length x 0.4cm width x 0.4cm depth; 0.157cm^2 area and 0.063cm^3 volume. There is Fat Layer (Subcutaneous Tissue) exposed. There is no tunneling or undermining noted. There is a small amount of serous drainage noted. The wound margin is distinct with the outline attached to the wound base. There is no granulation within the wound bed. There is a large (67-100%) amount of necrotic tissue within the wound bed including Eschar and Adherent Slough. The periwound skin appearance had no abnormalities noted for texture. The periwound skin appearance had no abnormalities noted for moisture. The periwound skin appearance had no abnormalities noted for color. Periwound temperature was noted as No Abnormality. The periwound has tenderness on palpation. 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 There was a Selective/Open Wound Non-Viable Tissue oe Debridement with a total area of 0.16 sq cm performed by Duanne Guess, MD. With the following instrument(s): Curette to remove Non-Viable tissue/material. Material removed includes Biofilm after achieving pain control using Lidocaine 4% Topical Solution. No specimens were taken. A time out was conducted at 14:45, prior to the start of the procedure. A Minimum amount of bleeding was controlled with Pressure. The procedure was tolerated well with a pain level of 0 throughout and a pain level of 0 following the procedure. Post Debridement  Measurements: 0.3cm length x 0.7cm width x 0.1cm depth; 0.016cm^3 volume. Post debridement Stage noted as Category/Stage IV. Character of Wound/Ulcer Post Debridement is stable. Post procedure Diagnosis Wound #10: Same as Pre-Procedure Wound #12 Pre-procedure diagnosis of Wound #12 is an Arterial Insufficiency Ulcer located on the Left T Great .Severity of Tissue Pre Debridement is: Fat layer oe exposed. There was a Selective/Open Wound Non-Viable Tissue Debridement with a total area of 0.03 sq cm performed by Duanne Guess, MD. With the following instrument(s): Curette to remove Non-Viable tissue/material. Material removed includes Eschar and Slough and after achieving pain control using Lidocaine 4% T opical Solution. No specimens were taken. A time out was conducted at 14:45, prior to the start of the procedure. A Minimum amount of bleeding was controlled with Pressure. The procedure was tolerated well with a pain level of 0 throughout and a pain level of 0 following the procedure. Post Debridement  Measurements: 0.2cm length x 0.2cm width x 0.1cm depth; 0.003cm^3 volume. Character of Wound/Ulcer Post Debridement is stable. Severity of Tissue Post Debridement is: Fat layer exposed. Post procedure Diagnosis Wound #12: Same as Pre-Procedure Wound #13 Pre-procedure diagnosis of Wound #13 is an Arterial Insufficiency Ulcer located on the Left,Dorsal T Great .Severity of Tissue Pre Debridement is: Necrosis oe of bone. There was a Excisional Skin/Subcutaneous Tissue/Muscle Debridement with a total area of 1.18 sq cm performed by Duanne Guess, MD. With the following instrument(s): Curette, Forceps, and Scissors to remove Viable and Non-Viable tissue/material. Material removed includes Tendon and Slough and after achieving pain control using Lidocaine 4% Topical Solution. No specimens were taken. A time out was conducted at 14:45, prior to the start of the procedure. A Minimum amount of bleeding was controlled with Pressure. The procedure was tolerated well with a pain level of 0 throughout and a pain level of 0 following the procedure. Post Debridement Measurements: 1cm length x 1.5cm width x 0.1cm depth; 0.118cm^3 volume. Character of Wound/Ulcer Post Debridement is stable. Severity of Tissue Post Debridement is: Bone involvement without necrosis. Post procedure Diagnosis Wound #13: Same as Pre-Procedure Wound #15 Pre-procedure diagnosis of Wound #15 is an Arterial Insufficiency Ulcer located on the Right,Dorsal T Great .Severity of Tissue Pre Debridement is: Fat oe layer exposed. There was a Selective/Open Wound Non-Viable Tissue Debridement with a total area of 0.59 sq cm performed by Duanne Guess, MD. With the following instrument(s): Curette to remove Non-Viable tissue/material. Material removed includes Eschar and Slough and after achieving pain control using Lidocaine 4% T opical Solution. No specimens were taken. A time out was conducted at 14:45, prior to the start of the procedure.  A Minimum amount of bleeding was controlled with Pressure. The procedure was tolerated well with a pain level of 0 throughout and a pain level of 0 following the procedure. Post Debridement Measurements: 1.5cm length x 0.5cm width x 0.1cm depth; 0.059cm^3 volume. Character of Wound/Ulcer Post Debridement is stable. Severity of Tissue Post Debridement is: Fat layer exposed. Post procedure Diagnosis Wound #15: Same as Pre-Procedure Wound #9 Pre-procedure diagnosis of Wound #9 is a Pressure Ulcer located on the Left Calcaneus . There was a Excisional Skin/Subcutaneous Tissue Debridement with a total area of 0.16 sq cm performed by Duanne Guess, MD. With the following instrument(s): Curette to remove Viable and Non-Viable tissue/material. Material removed includes Subcutaneous Tissue and Slough and after achieving pain control using Lidocaine 4% T opical Solution. No specimens were taken. A TAMEKI, NIESS ([id number]) [id number] Page 14 of 17 time  out was conducted at 14:45, prior to the start of the procedure. A Minimum amount of bleeding was controlled with Pressure. The procedure was tolerated well with a pain level of 0 throughout and a pain level of 0 following the procedure. Post Debridement Measurements: 0.5cm length x 0.4cm width x 0.4cm depth; 0.063cm^3 volume. Post debridement Stage noted as Category/Stage III. Character of Wound/Ulcer Post Debridement is stable. Post procedure Diagnosis Wound #9: Same as Pre-Procedure General Notes: 07/28/2023: Late entry for date of service 05/28/2023: The procedure note was amended to reflect the actual stage of the calcaneal wound (stage III). Note: This amendment was made greater than 24 to 48 hours after the date of service.. Plan Follow-up Appointments: Return Appointment in 2 weeks. - Dr. Lady Gary - room 1 Thurs 10/3@ 2:45 pm Anesthetic: (In clinic) Topical Lidocaine 4% applied to wound bed Bathing/ Shower/  Hygiene: May shower and wash wound with soap and water. Edema Control - Lymphedema / SCD / Other: Elevate legs to the level of the heart or above for 30 minutes daily and/or when sitting for 3-4 times a day throughout the day. Avoid standing for long periods of time. Patient to wear own compression stockings every day. Moisturize legs daily. - and feet Additional Orders / Instructions: Follow Nutritious Diet - add in protein shakes every day to diet - recommend premier protein 500 mg x3 a day vitamin C, zinc 30-50 mg per day Home Health: No change in wound care orders this week; continue Home Health for wound care. May utilize formulary equivalent dressing for wound treatment orders unless otherwise specified. Other Home Health Orders/Instructions: - Medi Home WOUND #10: - T Second Wound Laterality: Left oe Cleanser: Soap and Water 1 x Per Day/30 Days Discharge Instructions: May shower and wash wound with dial antibacterial soap and water prior to dressing change. Cleanser: Wound Cleanser 1 x Per Day/30 Days Discharge Instructions: Cleanse the wound with wound cleanser prior to applying a clean dressing using gauze sponges, not tissue or cotton balls. Peri-Wound Care: Sween Lotion (Moisturizing lotion) 1 x Per Day/30 Days Discharge Instructions: Apply moisturizing lotion as directed Prim Dressing: Promogran Prisma Matrix, 4.34 (sq in) (silver collagen) (Generic) 1 x Per Day/30 Days ary Discharge Instructions: Moisten collagen with saline or hydrogel Secondary Dressing: Woven Gauze Sponge, Non-Sterile 4x4 in (Generic) 1 x Per Day/30 Days Discharge Instructions: Apply over primary dressing as directed. Secured With: [employer], 4.5x3.1 (in/yd) 1 x Per Day/30 Days Discharge Instructions: Secure with Kerlix as directed. Secured With: 89M Medipore H Soft Cloth Surgical T ape, 4 x 10 (in/yd) (Generic) 1 x Per Day/30 Days Discharge Instructions: Secure with tape as directed. WOUND #11:  - T Second Wound Laterality: Right oe Cleanser: Soap and Water 1 x Per Day/30 Days Discharge Instructions: May shower and wash wound with dial antibacterial soap and water prior to dressing change. Cleanser: Wound Cleanser 1 x Per Day/30 Days Discharge Instructions: Cleanse the wound with wound cleanser prior to applying a clean dressing using gauze sponges, not tissue or cotton balls. Prim Dressing: Promogran Prisma Matrix, 4.34 (sq in) (silver collagen) (Generic) 1 x Per Day/30 Days ary Discharge Instructions: Moisten collagen with saline or hydrogel Secondary Dressing: Woven Gauze Sponge, Non-Sterile 4x4 in (Generic) 1 x Per Day/30 Days Discharge Instructions: Apply over primary dressing as directed. Secured With: Insurance underwriter, Sterile 2x75 (in/in) 1 x Per Day/30 Days Discharge Instructions: Secure with stretch gauze as directed. Secured With: 89M Medipore H Scientist, research (life sciences) Surgical  T ape, 4 x 10 (in/yd) (Generic) 1 x Per Day/30 Days Discharge Instructions: Secure with tape as directed. WOUND #12: - T Great Wound Laterality: Left oe Cleanser: Soap and Water 1 x Per Day/30 Days Discharge Instructions: May shower and wash wound with dial antibacterial soap and water prior to dressing change. Cleanser: Wound Cleanser 1 x Per Day/30 Days Discharge Instructions: Cleanse the wound with wound cleanser prior to applying a clean dressing using gauze sponges, not tissue or cotton balls. Prim Dressing: Promogran Prisma Matrix, 4.34 (sq in) (silver collagen) (Generic) 1 x Per Day/30 Days ary Discharge Instructions: Moisten collagen with saline or hydrogel Secondary Dressing: Woven Gauze Sponge, Non-Sterile 4x4 in (Generic) 1 x Per Day/30 Days Discharge Instructions: Apply over primary dressing as directed. Secured With: 11M Medipore H Soft Cloth Surgical T ape, 4 x 10 (in/yd) (Generic) 1 x Per Day/30 Days Discharge Instructions: Secure with tape as directed. WOUND #13: - T Great Wound  Laterality: Dorsal, Left oe Cleanser: Soap and Water 1 x Per Day/30 Days Discharge Instructions: May shower and wash wound with dial antibacterial soap and water prior to dressing change. Cleanser: Wound Cleanser 1 x Per Day/30 Days Discharge Instructions: Cleanse the wound with wound cleanser prior to applying a clean dressing using gauze sponges, not tissue or cotton balls. Prim Dressing: Promogran Prisma Matrix, 4.34 (sq in) (silver collagen) (Generic) 1 x Per Day/30 Days ary Discharge Instructions: Moisten collagen with saline or hydrogel Secondary Dressing: Woven Gauze Sponge, Non-Sterile 4x4 in (Generic) 1 x Per Day/30 Days Discharge Instructions: Apply over primary dressing as directed. Secured With: 11M Medipore H Soft Cloth Surgical T ape, 4 x 10 (in/yd) (Generic) 1 x Per Day/30 Days Discharge Instructions: Secure with tape as directed. WOUND #15: - T Great Wound Laterality: Dorsal, Right oe Cleanser: Soap and Water 1 x Per Day/30 Days Discharge Instructions: May shower and wash wound with dial antibacterial soap and water prior to dressing change. Cleanser: Wound Cleanser 1 x Per Day/30 Days Discharge Instructions: Cleanse the wound with wound cleanser prior to applying a clean dressing using gauze sponges, not tissue or cotton balls. Prim Dressing: Promogran Prisma Matrix, 4.34 (sq in) (silver collagen) (Generic) 1 x Per Day/30 Days ary Discharge Instructions: Moisten collagen with saline or hydrogel Secondary Dressing: Woven Gauze Sponge, Non-Sterile 4x4 in (Generic) 1 x Per Day/30 Days Discharge Instructions: Apply over primary dressing as directed. Secured With: Insurance underwriter, Sterile 2x75 (in/in) 1 x Per Day/30 Days [name], [name] ([id number]) [id number] Page 15 of 17 Discharge Instructions: Secure with stretch gauze as directed. Secured With: 11M Medipore H Soft Cloth Surgical T ape, 4 x 10 (in/yd) (Generic) 1 x Per Day/30  Days Discharge Instructions: Secure with tape as directed. WOUND #9: - Calcaneus Wound Laterality: Left Cleanser: Soap and Water 1 x Per Day/30 Days Discharge Instructions: May shower and wash wound with dial antibacterial soap and water prior to dressing change. Cleanser: Wound Cleanser 1 x Per Day/30 Days Discharge Instructions: Cleanse the wound with wound cleanser prior to applying a clean dressing using gauze sponges, not tissue or cotton balls. Prim Dressing: Promogran Prisma Matrix, 4.34 (sq in) (silver collagen) (Generic) 1 x Per Day/30 Days ary Discharge Instructions: Moisten collagen with saline or hydrogel Secondary Dressing: ALLEVYN Heel 4 1/2in x 5 1/2in / 10.5cm x 13.5cm (Generic) 1 x Per Day/30 Days Discharge Instructions: Apply over primary dressing as directed. Secondary Dressing: Woven Gauze Sponge, Non-Sterile 4x4 in (Generic) 1 x Per Day/30 Days  Discharge Instructions: Apply over primary dressing as directed. Secured With: [employer], 4.5x3.1 (in/yd) 1 x Per Day/30 Days Discharge Instructions: Secure with Kerlix as directed. Secured With: 33M Medipore H Soft Cloth Surgical T ape, 4 x 10 (in/yd) (Generic) 1 x Per Day/30 Days Discharge Instructions: Secure with tape as directed. 05/28/2023: Once again, there has been no real change to any of her wounds except that the wound at the tip of her left great toe is closed. I used a curette to debride slough, eschar, and biofilm from her toe wounds. I used scissors and forceps to remove more nonviable tendon from her left dorsal great toe wound. I debrided slough and subcutaneous tissue from her heel wound. We will continue to use Prisma silver collagen to all of the wound sites. I think given the stability of the wounds, as well as the patient's advanced age and comorbidities, that we can extend her visit interval to every other week. Electronic Signature(s) Signed: 07/29/2023 8:55:12 AM By: Duanne Guess MD  FACS Previous Signature: 07/28/2023 2:41:48 PM Version By: Duanne Guess MD FACS Previous Signature: 05/28/2023 3:43:52 PM Version By: Duanne Guess MD FACS Previous Signature: 05/28/2023 3:42:23 PM Version By: Duanne Guess MD FACS Previous Signature: 05/28/2023 3:41:30 PM Version By: Duanne Guess MD FACS Entered By: Duanne Guess on 07/29/2023 08:55:12 -------------------------------------------------------------------------------- HxROS Details Patient Name: Date of Service: Delaney, HA TTIE [name]. 05/28/2023 2:00 PM Medical Record Number: [id number] Patient Account Number: [id number] Date of Birth/Sex: Treating RN: Dec 31, 1927 (89 y.o. F) Primary Care Provider: [name] Other Clinician: Referring Provider: Treating Provider/Extender: Tiajuana Amass in Treatment: 11 Information Obtained From Patient Caregiver Chart Eyes Medical History: Positive for: Cataracts Hematologic/Lymphatic Medical History: Positive for: Anemia Past Medical History Notes: Thrombocytopenia, Anticoagulant long-term use Respiratory Medical History: Negative for: Chronic Obstructive Pulmonary Disease (COPD) Past Medical History Notes: Pulmonary embolus Cardiovascular Medical HistorySUNNIVA, CROMAN ([id number]) [id number] Page 16 of 17 Positive for: Arrhythmia - a-fib; Congestive Heart Failure; Hypotension; Peripheral Venous Disease Gastrointestinal Medical History: Past Medical History Notes: Hiatal hernia Endocrine Medical History: Past Medical History Notes: Hyperthyroidism, Hypothyroidism Genitourinary Medical History: Past Medical History Notes: CKD stage III Musculoskeletal Medical History: Positive for: 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 Past Medical History Notes: arthritis Neurologic Medical History: Positive for: Neuropathy HBO Extended History Items Eyes: Cataracts Immunizations Pneumococcal  Vaccine: Received Pneumococcal Vaccination: No Implantable Devices None Hospitalization / Surgery History Type of Hospitalization/Surgery Abdominal aortogram w/lower extremity Amputation toe (Right) Colonoscopy Elbow surgery Laparoscopic hysterectomy Family and Social History Cancer: Yes - Siblings; Diabetes: No; Heart Disease: Yes; Stroke: Yes - Mother; Former smoker - quit 2003; Marital Status - Widowed; Alcohol Use: Never; Drug Use: No History; Caffeine Use: Never; Financial Concerns: No; Food, Clothing or Shelter Needs: No; Support System Lacking: No; Transportation Concerns: No Electronic Signature(s) Signed: 05/28/2023 4:49:16 PM By: Duanne Guess MD FACS Entered By: Duanne Guess on 05/28/2023 15:34:05 -------------------------------------------------------------------------------- SuperBill Details Patient Name: Date of Service: [name] 05/28/2023 Medical Record Number: [id number] Patient Account Number: [id number] Date of Birth/Sex: Treating RN: Jun 28, 1928 (89 y.o. 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 L97.526 Non-pressure chronic ulcer of other part of left  foot with bone involvement without evidence of necrosis L97.522 Non-pressure chronic ulcer of other part of left foot with fat layer exposed I73.9 Peripheral vascular disease, unspecified I50.32 Chronic diastolic (congestive) heart failure N18.32 Chronic kidney disease, stage 3b I87.2 Venous insufficiency (chronic) (peripheral) Facility Procedures : CPT4 Code: 16109604 Description: 11042 - DEB SUBQ  TISSUE 20 SQ CM/< ICD-10 Diagnosis Description L89.623 Pressure ulcer of left heel, stage 3 Modifier: Quantity: 1 : CPT4 Code: 54098119 Description: 11043 - DEB MUSC/FASCIA 20 SQ CM/< ICD-10 Diagnosis Description 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 witho Modifier: ut evidence of necr Quantity: 1 osis : CPT4 Code: [id number] Description: 97597 - DEBRIDE WOUND 1ST 20 SQ CM OR < ICD-10 Diagnosis Description 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 with L97.526 Non-pressure chronic ulcer of other part of left foot with bone  involvement witho L97.522 Non-pressure chronic ulcer of other part of left foot with fat layer exposed Modifier: out evidence of nec ut evidence of necr Quantity: 1 rosis osis Physician Procedures : CPT4 Code Description Modifier [id number] 99213 - WC PHYS LEVEL 3 - EST PT ICD-10 Diagnosis Description L89.623 Pressure ulcer of left heel, 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 without evidence of  necro 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 without evidence of necros L97.522 Non-pressure chronic ulcer of other part of left foot with fat layer exposed Quantity: 1 sis is : [id number] 11042 - WC PHYS SUBQ TISS 20 SQ CM ICD-10 Diagnosis Description L89.623 Pressure ulcer of left heel, stage 3 Quantity: 1 : [id number] 11043 - WC PHYS DEBR MUSCLE/FASCIA 20 SQ CM ICD-10 Diagnosis Description 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 without evidence of necros Quantity: 1 is : [id number] 97597 - WC PHYS DEBR WO ANESTH 20 SQ CM ICD-10 Diagnosis Description 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 without evidence of necro L97.526 Non-pressure chronic ulcer of other part of left foot  with bone involvement without evidence of  necros L97.522 Non-pressure chronic ulcer of other part of left foot with fat layer exposed Quantity: 1 sis is Electronic Signature(s) Signed: 07/28/2023 2:43:36 PM By: Duanne Guess MD FACS Previous Signature: 05/28/2023 3:44:30 PM Version By: Duanne Guess MD FACS Entered By: Duanne Guess on 07/28/2023 14:43:35

## 2023-05-29 NOTE — Progress Notes (Signed)
Number: [id number] Date of Birth/Gender: Treating RN: October 14, 1927 (89 y.o. [name] Primary Care Physician: [name] Other Clinician: Referring Physician: Treating Physician/Extender: Tiajuana Amass in Treatment: 11 Education Assessment Education Provided To: Patient Education Topics Provided Pressure: Methods: Explain/Verbal Responses: Reinforcements needed, State content correctly Wound/Skin Impairment: Methods: Explain/Verbal Responses: Reinforcements needed, State content correctly Electronic Signature(s) Signed: 05/28/2023 3:36:31 PM By: Zenaida Deed RN, BSN Entered By: Zenaida Deed on 05/28/2023 11:24:59 -------------------------------------------------------------------------------- Wound Assessment Details Patient Name: Date of Service: Sunday, [name] 05/28/2023 2:00 PM Pro, [name] ([id number]) [id number] Page 7 of 15 Medical Record Number: [id number] Patient Account Number: [id number] Date of Birth/Sex: Treating RN: 1928/08/16 (89 y.o. F) Primary Care [name]: [name] Other Clinician: Referring [name]: Treating [name]/Extender: Claudie Leach Weeks in Treatment: 11 Wound Status Wound Number: 10 Primary Pressure Ulcer Etiology: Wound Location: Left T Second oe Wound Open Wounding  Event: Pressure Injury Status: Date Acquired: 10/09/2022 Comorbid Cataracts, Anemia, Arrhythmia, Congestive Heart Failure, Weeks Of Treatment: 11 History: Hypotension, Peripheral Venous Disease, Osteomyelitis, Clustered Wound: No Neuropathy Photos Wound Measurements Length: (cm) 0.3 Width: (cm) 0.7 Depth: (cm) 0.1 Area: (cm) 0.165 Volume: (cm) 0.016 % Reduction in Area: 76.7% % Reduction in Volume: 77.5% Epithelialization: None Tunneling: No Undermining: No Wound Description Classification: Category/Stage IV Wound Margin: Distinct, outline attached Exudate Amount: Small Exudate Type: Serosanguineous Exudate Color: red, brown Foul Odor After Cleansing: No Slough/Fibrino No Wound Bed Granulation Amount: Large (67-100%) Exposed Structure Granulation Quality: Red Fascia Exposed: No Necrotic Amount: Small (1-33%) Fat Layer (Subcutaneous Tissue) Exposed: Yes Necrotic Quality: Adherent Slough Tendon Exposed: No Muscle Exposed: No Joint Exposed: Yes Bone Exposed: Yes Periwound Skin Texture Texture Color No Abnormalities Noted: Yes No Abnormalities Noted: Yes Moisture Temperature / Pain No Abnormalities Noted: Yes Temperature: No Abnormality Treatment Notes Wound #10 (Toe Second) Wound Laterality: Left Cleanser Soap and Water Discharge Instruction: May shower and wash wound with dial antibacterial soap and water prior to dressing change. Wound Cleanser Discharge Instruction: Cleanse the wound with wound cleanser prior to applying a clean dressing using gauze sponges, not tissue or cotton balls. Peri-Wound Care Sween Lotion (Moisturizing lotion) Discharge Instruction: Apply moisturizing lotion as directed Topical Hourihan, Kendalynn M ([id number]) [id number] Page 8 of 15 Primary Dressing Promogran Prisma Matrix, 4.34 (sq in) (silver collagen) Discharge Instruction: Moisten collagen with saline or hydrogel Secondary Dressing Woven Gauze Sponge,  Non-Sterile 4x4 in Discharge Instruction: Apply over primary dressing as directed. Secured With [employer], 4.5x3.1 (in/yd) Discharge Instruction: Secure with Kerlix as directed. 71M Medipore H Soft Cloth Surgical T ape, 4 x 10 (in/yd) Discharge Instruction: Secure with tape as directed. Compression Wrap Compression Stockings Add-Ons Electronic Signature(s) Signed: 05/29/2023 9:26:29 AM By: Dayton Scrape Entered By: Dayton Scrape on 05/28/2023 11:34:43 -------------------------------------------------------------------------------- Wound Assessment Details Patient Name: Date of Service: [name], [name] 05/28/2023 2:00 PM Medical Record Number: [id number] Patient Account Number: [id number] Date of Birth/Sex: Treating RN: March 11, 1928 (89 y.o. F) Primary Care [name]: [name] Other Clinician: Referring Onix Jumper: Treating Demesha Boorman/Extender: Claudie Leach Weeks in Treatment: 11 Wound Status Wound Number: 11 Primary Pressure Ulcer Etiology: Wound Location: Right T Second oe Wound Open Wounding Event: Pressure Injury Status: Date Acquired: 10/09/2022 Comorbid Cataracts, Anemia, Arrhythmia, Congestive Heart Failure, Weeks Of Treatment: 11 History: Hypotension, Peripheral Venous Disease, Osteomyelitis, Clustered Wound: No Neuropathy Photos Wound Measurements Length: (cm) 0.5 Width: (cm) 0.6 Depth: (cm) 0.1 Area: (cm) 0.236 Volume: (cm) 0.024 % Reduction in Area: 66.6% % Reduction  Number: [id number] Date of Birth/Gender: Treating RN: October 14, 1927 (89 y.o. [name] Primary Care Physician: [name] Other Clinician: Referring Physician: Treating Physician/Extender: Tiajuana Amass in Treatment: 11 Education Assessment Education Provided To: Patient Education Topics Provided Pressure: Methods: Explain/Verbal Responses: Reinforcements needed, State content correctly Wound/Skin Impairment: Methods: Explain/Verbal Responses: Reinforcements needed, State content correctly Electronic Signature(s) Signed: 05/28/2023 3:36:31 PM By: Zenaida Deed RN, BSN Entered By: Zenaida Deed on 05/28/2023 11:24:59 -------------------------------------------------------------------------------- Wound Assessment Details Patient Name: Date of Service: Sunday, [name] 05/28/2023 2:00 PM Pro, [name] ([id number]) [id number] Page 7 of 15 Medical Record Number: [id number] Patient Account Number: [id number] Date of Birth/Sex: Treating RN: 1928/08/16 (89 y.o. F) Primary Care [name]: [name] Other Clinician: Referring [name]: Treating [name]/Extender: Claudie Leach Weeks in Treatment: 11 Wound Status Wound Number: 10 Primary Pressure Ulcer Etiology: Wound Location: Left T Second oe Wound Open Wounding  Event: Pressure Injury Status: Date Acquired: 10/09/2022 Comorbid Cataracts, Anemia, Arrhythmia, Congestive Heart Failure, Weeks Of Treatment: 11 History: Hypotension, Peripheral Venous Disease, Osteomyelitis, Clustered Wound: No Neuropathy Photos Wound Measurements Length: (cm) 0.3 Width: (cm) 0.7 Depth: (cm) 0.1 Area: (cm) 0.165 Volume: (cm) 0.016 % Reduction in Area: 76.7% % Reduction in Volume: 77.5% Epithelialization: None Tunneling: No Undermining: No Wound Description Classification: Category/Stage IV Wound Margin: Distinct, outline attached Exudate Amount: Small Exudate Type: Serosanguineous Exudate Color: red, brown Foul Odor After Cleansing: No Slough/Fibrino No Wound Bed Granulation Amount: Large (67-100%) Exposed Structure Granulation Quality: Red Fascia Exposed: No Necrotic Amount: Small (1-33%) Fat Layer (Subcutaneous Tissue) Exposed: Yes Necrotic Quality: Adherent Slough Tendon Exposed: No Muscle Exposed: No Joint Exposed: Yes Bone Exposed: Yes Periwound Skin Texture Texture Color No Abnormalities Noted: Yes No Abnormalities Noted: Yes Moisture Temperature / Pain No Abnormalities Noted: Yes Temperature: No Abnormality Treatment Notes Wound #10 (Toe Second) Wound Laterality: Left Cleanser Soap and Water Discharge Instruction: May shower and wash wound with dial antibacterial soap and water prior to dressing change. Wound Cleanser Discharge Instruction: Cleanse the wound with wound cleanser prior to applying a clean dressing using gauze sponges, not tissue or cotton balls. Peri-Wound Care Sween Lotion (Moisturizing lotion) Discharge Instruction: Apply moisturizing lotion as directed Topical Hourihan, Kendalynn M ([id number]) [id number] Page 8 of 15 Primary Dressing Promogran Prisma Matrix, 4.34 (sq in) (silver collagen) Discharge Instruction: Moisten collagen with saline or hydrogel Secondary Dressing Woven Gauze Sponge,  Non-Sterile 4x4 in Discharge Instruction: Apply over primary dressing as directed. Secured With [employer], 4.5x3.1 (in/yd) Discharge Instruction: Secure with Kerlix as directed. 71M Medipore H Soft Cloth Surgical T ape, 4 x 10 (in/yd) Discharge Instruction: Secure with tape as directed. Compression Wrap Compression Stockings Add-Ons Electronic Signature(s) Signed: 05/29/2023 9:26:29 AM By: Dayton Scrape Entered By: Dayton Scrape on 05/28/2023 11:34:43 -------------------------------------------------------------------------------- Wound Assessment Details Patient Name: Date of Service: [name], [name] 05/28/2023 2:00 PM Medical Record Number: [id number] Patient Account Number: [id number] Date of Birth/Sex: Treating RN: March 11, 1928 (89 y.o. F) Primary Care [name]: [name] Other Clinician: Referring Onix Jumper: Treating Demesha Boorman/Extender: Claudie Leach Weeks in Treatment: 11 Wound Status Wound Number: 11 Primary Pressure Ulcer Etiology: Wound Location: Right T Second oe Wound Open Wounding Event: Pressure Injury Status: Date Acquired: 10/09/2022 Comorbid Cataracts, Anemia, Arrhythmia, Congestive Heart Failure, Weeks Of Treatment: 11 History: Hypotension, Peripheral Venous Disease, Osteomyelitis, Clustered Wound: No Neuropathy Photos Wound Measurements Length: (cm) 0.5 Width: (cm) 0.6 Depth: (cm) 0.1 Area: (cm) 0.236 Volume: (cm) 0.024 % Reduction in Area: 66.6% % Reduction  Exposed Structures: Tendon: Yes Fascia: No Fascia: No Bone: Yes Tendon: No Tendon: No Muscle: No Muscle: No Joint: No Joint: No Bone: No Bone: No Small (1-33%) Small (1-33%) Small (1-33%) Epithelialization: Debridement - Excisional Debridement - Selective/Open Wound Debridement - Excisional Debridement: Pre-procedure Verification/Time Out 14:45 14:45 14:45 Taken: Lidocaine 4% Topical Solution Lidocaine 4%  Topical Solution Lidocaine 4% Topical Solution Pain Control: Tendon, Ambulance person, [employer], Slough Tissue Debrided: Skin/Subcutaneous Tissue/Muscle Non-Viable Tissue Skin/Subcutaneous Tissue Level: 1.18 0.59 0.16 Debridement A (sq cm): rea Curette, Forceps, Scissors Curette Curette Instrument: Minimum Minimum Minimum Bleeding: Pressure Pressure Pressure Hemostasis A chieved: 0 0 0 Procedural Pain: 0 0 0 Post Procedural Pain: Procedure was tolerated well Procedure was tolerated well Procedure was tolerated well Debridement Treatment Response: 1x1.5x0.1 1.5x0.5x0.1 0.5x0.4x0.4 Post Debridement Measurements L x W x D (cm) 0.118 0.059 0.063 Post Debridement Volume: (cm) N/A N/A Category/Stage II Post Debridement Stage: No Abnormalities Noted No Abnormalities Noted Callus: No Periwound Skin Texture: No Abnormalities Noted No Abnormalities Noted Dry/Scaly: Yes Periwound Skin Moisture: Erythema: No No Abnormalities Noted No Abnormalities Noted Periwound Skin Color: No Abnormality No Abnormality No Abnormality Temperature: Yes Yes Yes Tenderness on Palpation: Debridement Debridement Debridement Procedures Performed: Treatment Notes Electronic Signature(s) Signed: 05/28/2023 3:19:25 PM By: Duanne Guess MD FACS Entered By: Duanne Guess on 05/28/2023 12:19:25 -------------------------------------------------------------------------------- Multi-Disciplinary Care Plan Details Patient Name: Date of Service: [name] 05/28/2023 2:00 PM Medical Record Number: [id number] Patient Account Number: [id number] Date of Birth/Sex: Treating RN: 1928-06-22 (89 y.o. [name] Primary Care Uriah Philipson: [name] Other Clinician: Referring Tykerria Mccubbins: Treating Martha Ellerby/Extender: Tiajuana Amass in Treatment: 11 Multidisciplinary Care Plan reviewed with physician Active Inactive Abuse / Safety / Falls / Self Care  Management Nursing Diagnoses: Impaired home maintenance Impaired physical mobility WENDELIN, FRANGIPANE ([id number]) [id number] Page 5 of 15 Potential for falls Goals: Patient/caregiver will verbalize/demonstrate measure taken to improve self care Date Initiated: 03/10/2023 Target Resolution Date: 06/12/2023 Goal Status: Active Patient/caregiver will verbalize/demonstrate measures taken to improve the patient's personal safety Date Initiated: 03/10/2023 Target Resolution Date: 06/12/2023 Goal Status: Active Interventions: Assess fall risk on admission and as needed Assess: immobility, friction, shearing, incontinence upon admission and as needed Provide education on fall prevention Notes: Wound/Skin Impairment Nursing Diagnoses: Impaired tissue integrity Knowledge deficit related to ulceration/compromised skin integrity Goals: Patient/caregiver will verbalize understanding of skin care regimen Date Initiated: 03/10/2023 Target Resolution Date: 06/12/2023 Goal Status: Active Interventions: Assess patient/caregiver ability to obtain necessary supplies Assess patient/caregiver ability to perform ulcer/skin care regimen upon admission and as needed Assess ulceration(s) every visit Treatment Activities: Skin care regimen initiated : 03/10/2023 Topical wound management initiated : 03/10/2023 Notes: Electronic Signature(s) Signed: 05/28/2023 3:36:31 PM By: Zenaida Deed RN, BSN Entered By: Zenaida Deed on 05/28/2023 11:24:16 -------------------------------------------------------------------------------- Pain Assessment Details Patient Name: Date of Service: [name] 05/28/2023 2:00 PM Medical Record Number: [id number] Patient Account Number: [id number] Date of Birth/Sex: Treating RN: 1928-08-27 (89 y.o. F) Primary Care Rutherford Alarie: [name] Other Clinician: Referring Karlynn Furrow: Treating Bladen Umar/Extender: Claudie Leach Weeks in Treatment:  11 Active Problems Location of Pain Severity and Description of Pain Patient Has Paino No Site Locations Sanderson, Nakaiya M ([id number]) [id number] Page 6 of 15 Pain Management and Medication Current Pain Management: Electronic Signature(s) Signed: 05/29/2023 9:26:29 AM By: Dayton Scrape Entered By: Dayton Scrape on 05/28/2023 11:17:03 -------------------------------------------------------------------------------- Patient/Caregiver Education Details Patient Name: Date of Service: [name] 9/19/2024andnbsp2:00 PM Medical Record Number: [id number] Patient Account  Exposed Structures: Tendon: Yes Fascia: No Fascia: No Bone: Yes Tendon: No Tendon: No Muscle: No Muscle: No Joint: No Joint: No Bone: No Bone: No Small (1-33%) Small (1-33%) Small (1-33%) Epithelialization: Debridement - Excisional Debridement - Selective/Open Wound Debridement - Excisional Debridement: Pre-procedure Verification/Time Out 14:45 14:45 14:45 Taken: Lidocaine 4% Topical Solution Lidocaine 4%  Topical Solution Lidocaine 4% Topical Solution Pain Control: Tendon, Ambulance person, [employer], Slough Tissue Debrided: Skin/Subcutaneous Tissue/Muscle Non-Viable Tissue Skin/Subcutaneous Tissue Level: 1.18 0.59 0.16 Debridement A (sq cm): rea Curette, Forceps, Scissors Curette Curette Instrument: Minimum Minimum Minimum Bleeding: Pressure Pressure Pressure Hemostasis A chieved: 0 0 0 Procedural Pain: 0 0 0 Post Procedural Pain: Procedure was tolerated well Procedure was tolerated well Procedure was tolerated well Debridement Treatment Response: 1x1.5x0.1 1.5x0.5x0.1 0.5x0.4x0.4 Post Debridement Measurements L x W x D (cm) 0.118 0.059 0.063 Post Debridement Volume: (cm) N/A N/A Category/Stage II Post Debridement Stage: No Abnormalities Noted No Abnormalities Noted Callus: No Periwound Skin Texture: No Abnormalities Noted No Abnormalities Noted Dry/Scaly: Yes Periwound Skin Moisture: Erythema: No No Abnormalities Noted No Abnormalities Noted Periwound Skin Color: No Abnormality No Abnormality No Abnormality Temperature: Yes Yes Yes Tenderness on Palpation: Debridement Debridement Debridement Procedures Performed: Treatment Notes Electronic Signature(s) Signed: 05/28/2023 3:19:25 PM By: Duanne Guess MD FACS Entered By: Duanne Guess on 05/28/2023 12:19:25 -------------------------------------------------------------------------------- Multi-Disciplinary Care Plan Details Patient Name: Date of Service: [name] 05/28/2023 2:00 PM Medical Record Number: [id number] Patient Account Number: [id number] Date of Birth/Sex: Treating RN: 1928-06-22 (89 y.o. [name] Primary Care Uriah Philipson: [name] Other Clinician: Referring Tykerria Mccubbins: Treating Martha Ellerby/Extender: Tiajuana Amass in Treatment: 11 Multidisciplinary Care Plan reviewed with physician Active Inactive Abuse / Safety / Falls / Self Care  Management Nursing Diagnoses: Impaired home maintenance Impaired physical mobility WENDELIN, FRANGIPANE ([id number]) [id number] Page 5 of 15 Potential for falls Goals: Patient/caregiver will verbalize/demonstrate measure taken to improve self care Date Initiated: 03/10/2023 Target Resolution Date: 06/12/2023 Goal Status: Active Patient/caregiver will verbalize/demonstrate measures taken to improve the patient's personal safety Date Initiated: 03/10/2023 Target Resolution Date: 06/12/2023 Goal Status: Active Interventions: Assess fall risk on admission and as needed Assess: immobility, friction, shearing, incontinence upon admission and as needed Provide education on fall prevention Notes: Wound/Skin Impairment Nursing Diagnoses: Impaired tissue integrity Knowledge deficit related to ulceration/compromised skin integrity Goals: Patient/caregiver will verbalize understanding of skin care regimen Date Initiated: 03/10/2023 Target Resolution Date: 06/12/2023 Goal Status: Active Interventions: Assess patient/caregiver ability to obtain necessary supplies Assess patient/caregiver ability to perform ulcer/skin care regimen upon admission and as needed Assess ulceration(s) every visit Treatment Activities: Skin care regimen initiated : 03/10/2023 Topical wound management initiated : 03/10/2023 Notes: Electronic Signature(s) Signed: 05/28/2023 3:36:31 PM By: Zenaida Deed RN, BSN Entered By: Zenaida Deed on 05/28/2023 11:24:16 -------------------------------------------------------------------------------- Pain Assessment Details Patient Name: Date of Service: [name] 05/28/2023 2:00 PM Medical Record Number: [id number] Patient Account Number: [id number] Date of Birth/Sex: Treating RN: 1928-08-27 (89 y.o. F) Primary Care Rutherford Alarie: [name] Other Clinician: Referring Karlynn Furrow: Treating Bladen Umar/Extender: Claudie Leach Weeks in Treatment:  11 Active Problems Location of Pain Severity and Description of Pain Patient Has Paino No Site Locations Sanderson, Nakaiya M ([id number]) [id number] Page 6 of 15 Pain Management and Medication Current Pain Management: Electronic Signature(s) Signed: 05/29/2023 9:26:29 AM By: Dayton Scrape Entered By: Dayton Scrape on 05/28/2023 11:17:03 -------------------------------------------------------------------------------- Patient/Caregiver Education Details Patient Name: Date of Service: [name] 9/19/2024andnbsp2:00 PM Medical Record Number: [id number] Patient Account  Number: [id number] Date of Birth/Gender: Treating RN: October 14, 1927 (89 y.o. [name] Primary Care Physician: [name] Other Clinician: Referring Physician: Treating Physician/Extender: Tiajuana Amass in Treatment: 11 Education Assessment Education Provided To: Patient Education Topics Provided Pressure: Methods: Explain/Verbal Responses: Reinforcements needed, State content correctly Wound/Skin Impairment: Methods: Explain/Verbal Responses: Reinforcements needed, State content correctly Electronic Signature(s) Signed: 05/28/2023 3:36:31 PM By: Zenaida Deed RN, BSN Entered By: Zenaida Deed on 05/28/2023 11:24:59 -------------------------------------------------------------------------------- Wound Assessment Details Patient Name: Date of Service: Sunday, [name] 05/28/2023 2:00 PM Pro, [name] ([id number]) [id number] Page 7 of 15 Medical Record Number: [id number] Patient Account Number: [id number] Date of Birth/Sex: Treating RN: 1928/08/16 (89 y.o. F) Primary Care [name]: [name] Other Clinician: Referring [name]: Treating [name]/Extender: Claudie Leach Weeks in Treatment: 11 Wound Status Wound Number: 10 Primary Pressure Ulcer Etiology: Wound Location: Left T Second oe Wound Open Wounding  Event: Pressure Injury Status: Date Acquired: 10/09/2022 Comorbid Cataracts, Anemia, Arrhythmia, Congestive Heart Failure, Weeks Of Treatment: 11 History: Hypotension, Peripheral Venous Disease, Osteomyelitis, Clustered Wound: No Neuropathy Photos Wound Measurements Length: (cm) 0.3 Width: (cm) 0.7 Depth: (cm) 0.1 Area: (cm) 0.165 Volume: (cm) 0.016 % Reduction in Area: 76.7% % Reduction in Volume: 77.5% Epithelialization: None Tunneling: No Undermining: No Wound Description Classification: Category/Stage IV Wound Margin: Distinct, outline attached Exudate Amount: Small Exudate Type: Serosanguineous Exudate Color: red, brown Foul Odor After Cleansing: No Slough/Fibrino No Wound Bed Granulation Amount: Large (67-100%) Exposed Structure Granulation Quality: Red Fascia Exposed: No Necrotic Amount: Small (1-33%) Fat Layer (Subcutaneous Tissue) Exposed: Yes Necrotic Quality: Adherent Slough Tendon Exposed: No Muscle Exposed: No Joint Exposed: Yes Bone Exposed: Yes Periwound Skin Texture Texture Color No Abnormalities Noted: Yes No Abnormalities Noted: Yes Moisture Temperature / Pain No Abnormalities Noted: Yes Temperature: No Abnormality Treatment Notes Wound #10 (Toe Second) Wound Laterality: Left Cleanser Soap and Water Discharge Instruction: May shower and wash wound with dial antibacterial soap and water prior to dressing change. Wound Cleanser Discharge Instruction: Cleanse the wound with wound cleanser prior to applying a clean dressing using gauze sponges, not tissue or cotton balls. Peri-Wound Care Sween Lotion (Moisturizing lotion) Discharge Instruction: Apply moisturizing lotion as directed Topical Hourihan, Kendalynn M ([id number]) [id number] Page 8 of 15 Primary Dressing Promogran Prisma Matrix, 4.34 (sq in) (silver collagen) Discharge Instruction: Moisten collagen with saline or hydrogel Secondary Dressing Woven Gauze Sponge,  Non-Sterile 4x4 in Discharge Instruction: Apply over primary dressing as directed. Secured With [employer], 4.5x3.1 (in/yd) Discharge Instruction: Secure with Kerlix as directed. 71M Medipore H Soft Cloth Surgical T ape, 4 x 10 (in/yd) Discharge Instruction: Secure with tape as directed. Compression Wrap Compression Stockings Add-Ons Electronic Signature(s) Signed: 05/29/2023 9:26:29 AM By: Dayton Scrape Entered By: Dayton Scrape on 05/28/2023 11:34:43 -------------------------------------------------------------------------------- Wound Assessment Details Patient Name: Date of Service: [name], [name] 05/28/2023 2:00 PM Medical Record Number: [id number] Patient Account Number: [id number] Date of Birth/Sex: Treating RN: March 11, 1928 (89 y.o. F) Primary Care [name]: [name] Other Clinician: Referring Onix Jumper: Treating Demesha Boorman/Extender: Claudie Leach Weeks in Treatment: 11 Wound Status Wound Number: 11 Primary Pressure Ulcer Etiology: Wound Location: Right T Second oe Wound Open Wounding Event: Pressure Injury Status: Date Acquired: 10/09/2022 Comorbid Cataracts, Anemia, Arrhythmia, Congestive Heart Failure, Weeks Of Treatment: 11 History: Hypotension, Peripheral Venous Disease, Osteomyelitis, Clustered Wound: No Neuropathy Photos Wound Measurements Length: (cm) 0.5 Width: (cm) 0.6 Depth: (cm) 0.1 Area: (cm) 0.236 Volume: (cm) 0.024 % Reduction in Area: 66.6% % Reduction  DEZHANE, OSTER ([id number]) [id number] Page 1 of 15 Visit Report for 05/28/2023 Arrival Information Details Patient Name: Date of Service: JALYSE, MEERSMAN. 05/28/2023 2:00 PM Medical Record Number: [id number] Patient Account Number: [id number] Date of Birth/Sex: Treating RN: [birth year] (89 y.o. F) Primary Care Aashir Umholtz: [name] Other Clinician: Referring Eboni Coval: Treating Earlena Werst/Extender: Tiajuana Amass in Treatment: 11 Visit Information History Since Last Visit Added or deleted any medications: No Patient Arrived: Wheel Chair Any new allergies or adverse reactions: No Arrival Time: 14:15 Had a fall or experienced change in No Accompanied By: daughter activities of daily living that may affect Transfer Assistance: None risk of falls: Patient Identification Verified: Yes Signs or symptoms of abuse/neglect since last visito No Secondary Verification Process Completed: Yes Hospitalized since last visit: No Patient Requires Transmission-Based Precautions: No Implantable device outside of the clinic excluding No Patient Has Alerts: Yes cellular tissue based products placed in the center Patient Alerts: ABI R: 0.48 L: 0.46 2/24 since last visit: TBI R: 0.39 L: 0.21 2/24 Pain Present Now: No Electronic Signature(s) Signed: 05/29/2023 9:26:29 AM By: Dayton Scrape Entered By: Dayton Scrape on 05/28/2023 11:16:17 -------------------------------------------------------------------------------- Encounter Discharge Information Details Patient Name: Date of Service: [name] 05/28/2023 2:00 PM Medical Record Number: [id number] Patient Account Number: [id number] Date of Birth/Sex: Treating RN: 08/23/1928 (89 y.o. [name] Primary Care Darinda [place]: [name] Other Clinician: Referring Kailena Lubas: Treating Keelyn Fjelstad/Extender: Claudie Leach Weeks in Treatment: 11 Encounter Discharge Information Items Post  Procedure Vitals Discharge Condition: Stable Temperature (F): 98.3 Ambulatory Status: Wheelchair Pulse (bpm): 65 Discharge Destination: Home Respiratory Rate (breaths/min): 18 Transportation: Private Auto Blood Pressure (mmHg): 122/57 Accompanied By: daughter Schedule Follow-up Appointment: Yes Clinical Summary of Care: Patient Declined Electronic Signature(s) Signed: 05/28/2023 3:36:31 PM By: Zenaida Deed RN, BSN Entered By: Zenaida Deed on 05/28/2023 12:36:02 Behunin, [name] ([id number]) [id number] Page 2 of 15 -------------------------------------------------------------------------------- Lower Extremity Assessment Details Patient Name: Date of Service: HOLLEE, LAMBERTON 05/28/2023 2:00 PM Medical Record Number: [id number] Patient Account Number: [id number] Date of Birth/Sex: Treating RN: [birth year] (89 y.o. F) Primary Care Bemnet Trovato: [name] Other Clinician: Referring Emmaclaire Switala: Treating Nene Aranas/Extender: Claudie Leach Weeks in Treatment: 11 Edema Assessment Assessed: [Left: No] [Right: No] Edema: [Left: Yes] [Right: Yes] Calf Left: Right: Point of Measurement: From Medial Instep 39 cm 36.5 cm Ankle Left: Right: Point of Measurement: From Medial Instep 24.5 cm 24 cm Vascular Assessment Pulses: Dorsalis Pedis Palpable: [Left:No] [Right:Yes] Extremity colors, hair growth, and conditions: Extremity Color: [Left:Hyperpigmented] [Right:Hyperpigmented] Hair Growth on Extremity: [Left:No] [Right:No] Temperature of Extremity: [Left:Warm < 3 seconds] [Right:Warm < 3 seconds] Electronic Signature(s) Signed: 05/29/2023 9:26:29 AM By: Dayton Scrape Entered By: Dayton Scrape on 05/28/2023 11:33:51 -------------------------------------------------------------------------------- Multi Wound Chart Details Patient Name: Date of Service: [name] 05/28/2023 2:00 PM Medical Record Number: [id number] Patient Account Number:  [id number] Date of Birth/Sex: Treating RN: 1928-02-09 (89 y.o. 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 05/28/2023 2:00 PM Medical Record Number: [id number] Patient Account Number: [id number] Date of Birth/Sex: Treating RN: [birth year] (89 y.o. F) Primary Care Aashir Umholtz: [name] Other Clinician: Referring Eboni Coval: Treating Earlena Werst/Extender: Tiajuana Amass in Treatment: 11 Visit Information History Since Last Visit Added or deleted any medications: No Patient Arrived: Wheel Chair Any new allergies or adverse reactions: No Arrival Time: 14:15 Had a fall or experienced change in No Accompanied By: daughter activities of daily living that may affect Transfer Assistance: None risk of falls: Patient Identification Verified: Yes Signs or symptoms of abuse/neglect since last visito No Secondary Verification Process Completed: Yes Hospitalized since last visit: No Patient Requires Transmission-Based Precautions: No Implantable device outside of the clinic excluding No Patient Has Alerts: Yes cellular tissue based products placed in the center Patient Alerts: ABI R: 0.48 L: 0.46 2/24 since last visit: TBI R: 0.39 L: 0.21 2/24 Pain Present Now: No Electronic Signature(s) Signed: 05/29/2023 9:26:29 AM By: Dayton Scrape Entered By: Dayton Scrape on 05/28/2023 11:16:17 -------------------------------------------------------------------------------- Encounter Discharge Information Details Patient Name: Date of Service: [name] 05/28/2023 2:00 PM Medical Record Number: [id number] Patient Account Number: [id number] Date of Birth/Sex: Treating RN: 08/23/1928 (89 y.o. [name] Primary Care Darinda [place]: [name] Other Clinician: Referring Kailena Lubas: Treating Keelyn Fjelstad/Extender: Claudie Leach Weeks in Treatment: 11 Encounter Discharge Information Items Post  Procedure Vitals Discharge Condition: Stable Temperature (F): 98.3 Ambulatory Status: Wheelchair Pulse (bpm): 65 Discharge Destination: Home Respiratory Rate (breaths/min): 18 Transportation: Private Auto Blood Pressure (mmHg): 122/57 Accompanied By: daughter Schedule Follow-up Appointment: Yes Clinical Summary of Care: Patient Declined Electronic Signature(s) Signed: 05/28/2023 3:36:31 PM By: Zenaida Deed RN, BSN Entered By: Zenaida Deed on 05/28/2023 12:36:02 Behunin, [name] ([id number]) [id number] Page 2 of 15 -------------------------------------------------------------------------------- Lower Extremity Assessment Details Patient Name: Date of Service: HOLLEE, LAMBERTON 05/28/2023 2:00 PM Medical Record Number: [id number] Patient Account Number: [id number] Date of Birth/Sex: Treating RN: [birth year] (89 y.o. F) Primary Care Bemnet Trov[place]: [name] Other Clinician: Referring Emmaclaire Switala: Treating Nene Aranas/Extender: Claudie Leach Weeks in Treatment: 11 Edema Assessment Assessed: [Left: No] [Right: No] Edema: [Left: Yes] [Right: Yes] Calf Left: Right: Point of Measurement: From Medial Instep 39 cm 36.5 cm Ankle Left: Right: Point of Measurement: From Medial Instep 24.5 cm 24 cm Vascular Assessment Pulses: Dorsalis Pedis Palpable: [Left:No] [Right:Yes] Extremity colors, hair growth, and conditions: Extremity Color: [Left:Hyperpigmented] [Right:Hyperpigmented] Hair Growth on Extremity: [Left:No] [Right:No] Temperature of Extremity: [Left:Warm < 3 seconds] [Right:Warm < 3 seconds] Electronic Signature(s) Signed: 05/29/2023 9:26:29 AM By: Dayton Scrape Entered By: Dayton Scrape on 05/28/2023 11:33:51 -------------------------------------------------------------------------------- Multi Wound Chart Details Patient Name: Date of Service: [name] 05/28/2023 2:00 PM Medical Record Number: [id number] Patient Account Number:  [id number] Date of Birth/Sex: Treating RN: 1928-02-09 (89 y.o. 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 05/28/2023 2:00 PM Medical Record Number: [id number] Patient Account Number: [id number] Date of Birth/Sex: Treating RN: 1928/05/23 (89 y.o.  F) Primary Care Taresa [place]: [name] Other Clinician: Referring Lee Kuang: Treating Petronella Shuford/Extender: Claudie Leach Weeks in Treatment: 11 Vital Signs Time Taken: 02:16 Temperature (F): 98.3 Height (in): 64 Pulse (bpm): 65 Weight (lbs): 157 Respiratory Rate (breaths/min): 18 Body Mass Index (BMI): 26.9 Blood Pressure (mmHg): 122/57 Reference Range: 80 - 120 mg / dl Electronic Signature(s) Signed: 05/29/2023 9:26:29 AM By: Dayton Scrape Entered By: Dayton Scrape on 05/28/2023 11:16:55

## 2023-06-01 ENCOUNTER — Other Ambulatory Visit (HOSPITAL_BASED_OUTPATIENT_CLINIC_OR_DEPARTMENT_OTHER): Payer: Self-pay | Admitting: Family

## 2023-06-01 DIAGNOSIS — I5032 Chronic diastolic (congestive) heart failure: Secondary | ICD-10-CM

## 2023-06-01 DIAGNOSIS — I272 Pulmonary hypertension, unspecified: Secondary | ICD-10-CM

## 2023-06-04 ENCOUNTER — Ambulatory Visit (HOSPITAL_BASED_OUTPATIENT_CLINIC_OR_DEPARTMENT_OTHER): Payer: Medicare HMO | Admitting: General Surgery

## 2023-06-11 ENCOUNTER — Encounter (HOSPITAL_BASED_OUTPATIENT_CLINIC_OR_DEPARTMENT_OTHER): Payer: Medicare HMO | Attending: General Surgery | Admitting: General Surgery

## 2023-06-11 DIAGNOSIS — E039 Hypothyroidism, unspecified: Secondary | ICD-10-CM | POA: Insufficient documentation

## 2023-06-11 DIAGNOSIS — I872 Venous insufficiency (chronic) (peripheral): Secondary | ICD-10-CM | POA: Diagnosis not present

## 2023-06-11 DIAGNOSIS — D631 Anemia in chronic kidney disease: Secondary | ICD-10-CM | POA: Insufficient documentation

## 2023-06-11 DIAGNOSIS — L89623 Pressure ulcer of left heel, stage 3: Secondary | ICD-10-CM | POA: Diagnosis present

## 2023-06-11 DIAGNOSIS — L97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 without evidence of necrosis: Secondary | ICD-10-CM | POA: Diagnosis not present

## 2023-06-11 DIAGNOSIS — I5032 Chronic diastolic (congestive) heart failure: Secondary | ICD-10-CM | POA: Insufficient documentation

## 2023-06-11 DIAGNOSIS — N1832 Chronic kidney disease, stage 3b: Secondary | ICD-10-CM | POA: Diagnosis not present

## 2023-06-11 DIAGNOSIS — I4891 Unspecified atrial fibrillation: Secondary | ICD-10-CM | POA: Diagnosis not present

## 2023-06-11 DIAGNOSIS — I130 Hypertensive heart and chronic kidney disease with heart failure and stage 1 through stage 4 chronic kidney disease, or unspecified chronic kidney disease: Secondary | ICD-10-CM | POA: Diagnosis not present

## 2023-06-11 DIAGNOSIS — I739 Peripheral vascular disease, unspecified: Secondary | ICD-10-CM | POA: Insufficient documentation

## 2023-06-11 DIAGNOSIS — L97522 Non-pressure chronic ulcer of other part of left foot with fat layer exposed: Secondary | ICD-10-CM | POA: Insufficient documentation

## 2023-06-11 DIAGNOSIS — J449 Chronic obstructive pulmonary disease, unspecified: Secondary | ICD-10-CM | POA: Diagnosis not present

## 2023-06-11 DIAGNOSIS — Z7901 Long term (current) use of anticoagulants: Secondary | ICD-10-CM | POA: Diagnosis not present

## 2023-06-11 NOTE — Progress Notes (Signed)
Non-Sterile 4x4 in Discharge Instruction: Apply over primary dressing as directed. Secured With 669M Medipore H Soft Cloth Surgical T ape, 4 x 10 (in/yd) Discharge Instruction: Secure with tape as directed. [name], [name] ([id number]) [id number] Page 6 of 17 Compression Wrap Compression Stockings Add-Ons Wound #15 (Toe Great) Wound Laterality: Dorsal, Right Cleanser Soap and Water Discharge Instruction: Megan shower and wash wound with dial antibacterial soap and water prior to dressing change. Wound Cleanser Discharge Instruction: Cleanse the wound with wound cleanser prior to applying a clean dressing using gauze sponges, not tissue or cotton balls. Peri-Wound Care Topical Primary Dressing Promogran Prisma Matrix, 4.34 (sq in) (silver collagen) Discharge Instruction: Moisten collagen with saline or hydrogel Secondary Dressing Woven Gauze Sponge, Non-Sterile 4x4 in Discharge Instruction: Apply over primary dressing as directed. Secured With Conforming Stretch Gauze Bandage, Sterile 2x75 (in/in) Discharge Instruction: Secure with stretch gauze as directed. 669M Medipore H Soft Cloth Surgical T ape, 4 x 10 (in/yd) Discharge Instruction: Secure with tape as directed. Compression Wrap Compression Stockings Add-Ons Wound #9 (Calcaneus) Wound Laterality: Left Cleanser Soap and Water Discharge Instruction: Megan shower and wash wound with dial antibacterial soap and water prior to dressing change. Wound Cleanser Discharge Instruction: Cleanse the wound with wound cleanser prior to applying a clean dressing using gauze sponges, not tissue or cotton balls. Peri-Wound Care Topical Primary Dressing Promogran Prisma Matrix, 4.34 (sq in) (silver collagen) Discharge Instruction: Moisten collagen with saline or hydrogel Secondary Dressing ALLEVYN Heel 4 1/2in x 5 1/2in /  10.5cm x 13.5cm Discharge Instruction: Apply over primary dressing as directed. Woven Gauze Sponge, Non-Sterile 4x4 in Discharge Instruction: Apply over primary dressing as directed. Secured With [employer], 4.5x3.1 (in/yd) Discharge Instruction: Secure with Kerlix as directed. 669M Medipore H Soft Cloth Surgical T ape, 4 x 10 (in/yd) Discharge Instruction: Secure with tape as directed. Compression Wrap Compression Stockings Add-Ons [name], [name] ([id number]) [id number] Page 7 of 17 Electronic Signature(s) Signed: 06/11/2023 4:16:41 PM By: Duanne Guess MD FACS Entered By: Duanne Guess on 06/11/2023 13:16:40 -------------------------------------------------------------------------------- Multi-Disciplinary Care Plan Details Patient Name: Date of Service: [name]. 06/11/2023 2:45 PM Medical Record Number: [id number] Patient Account Number: [id number] Date of Birth/Sex: Treating RN: [name] (89 y.o. [name] Primary Care [name]: [name] Other Clinician: Referring [name]: Treating [name]/Extender: [name] in Treatment: 13 Multidisciplinary Care Plan reviewed with physician Active Inactive Abuse / Safety / Falls / Self Care Management Nursing Diagnoses: Impaired home maintenance Impaired physical mobility Potential for falls Goals: Patient/caregiver will verbalize/demonstrate measure taken to improve self care Date Initiated: 03/10/2023 Target Resolution Date: 07/10/2023 Goal Status: Active Patient/caregiver will verbalize/demonstrate measures taken to improve the patient's personal safety Date Initiated: 03/10/2023 Target Resolution Date: 07/10/2023 Goal Status: Active Interventions: Assess fall risk on admission and as needed Assess: immobility, friction, shearing, incontinence upon admission and as needed Provide education on fall prevention Notes: Wound/Skin Impairment Nursing  Diagnoses: Impaired tissue integrity Knowledge deficit related to ulceration/compromised skin integrity Goals: Patient/caregiver will verbalize understanding of skin care regimen Date Initiated: 03/10/2023 Target Resolution Date: 07/10/2023 Goal Status: Active Interventions: Assess patient/caregiver ability to obtain necessary supplies Assess patient/caregiver ability to perform ulcer/skin care regimen upon admission and as needed Assess ulceration(s) every visit Treatment Activities: Skin care regimen initiated : 03/10/2023 Topical wound management initiated : 03/10/2023 Notes: Electronic Signature(s) Signed: 06/11/2023 4:38:08 PM By: Zenaida Deed RN, BSN Entered By: Zenaida Deed on 06/11/2023 12:30:36 [name], [name] ([id number]) [id number] Page 8 of 17 --------------------------------------------------------------------------------  Non-Sterile 4x4 in Discharge Instruction: Apply over primary dressing as directed. Secured With 669M Medipore H Soft Cloth Surgical T ape, 4 x 10 (in/yd) Discharge Instruction: Secure with tape as directed. [name], [name] ([id number]) [id number] Page 6 of 17 Compression Wrap Compression Stockings Add-Ons Wound #15 (Toe Great) Wound Laterality: Dorsal, Right Cleanser Soap and Water Discharge Instruction: Megan shower and wash wound with dial antibacterial soap and water prior to dressing change. Wound Cleanser Discharge Instruction: Cleanse the wound with wound cleanser prior to applying a clean dressing using gauze sponges, not tissue or cotton balls. Peri-Wound Care Topical Primary Dressing Promogran Prisma Matrix, 4.34 (sq in) (silver collagen) Discharge Instruction: Moisten collagen with saline or hydrogel Secondary Dressing Woven Gauze Sponge, Non-Sterile 4x4 in Discharge Instruction: Apply over primary dressing as directed. Secured With Conforming Stretch Gauze Bandage, Sterile 2x75 (in/in) Discharge Instruction: Secure with stretch gauze as directed. 669M Medipore H Soft Cloth Surgical T ape, 4 x 10 (in/yd) Discharge Instruction: Secure with tape as directed. Compression Wrap Compression Stockings Add-Ons Wound #9 (Calcaneus) Wound Laterality: Left Cleanser Soap and Water Discharge Instruction: Megan shower and wash wound with dial antibacterial soap and water prior to dressing change. Wound Cleanser Discharge Instruction: Cleanse the wound with wound cleanser prior to applying a clean dressing using gauze sponges, not tissue or cotton balls. Peri-Wound Care Topical Primary Dressing Promogran Prisma Matrix, 4.34 (sq in) (silver collagen) Discharge Instruction: Moisten collagen with saline or hydrogel Secondary Dressing ALLEVYN Heel 4 1/2in x 5 1/2in /  10.5cm x 13.5cm Discharge Instruction: Apply over primary dressing as directed. Woven Gauze Sponge, Non-Sterile 4x4 in Discharge Instruction: Apply over primary dressing as directed. Secured With [employer], 4.5x3.1 (in/yd) Discharge Instruction: Secure with Kerlix as directed. 669M Medipore H Soft Cloth Surgical T ape, 4 x 10 (in/yd) Discharge Instruction: Secure with tape as directed. Compression Wrap Compression Stockings Add-Ons [name], [name] ([id number]) [id number] Page 7 of 17 Electronic Signature(s) Signed: 06/11/2023 4:16:41 PM By: Duanne Guess MD FACS Entered By: Duanne Guess on 06/11/2023 13:16:40 -------------------------------------------------------------------------------- Multi-Disciplinary Care Plan Details Patient Name: Date of Service: [name]. 06/11/2023 2:45 PM Medical Record Number: [id number] Patient Account Number: [id number] Date of Birth/Sex: Treating RN: [name] (89 y.o. [name] Primary Care [name]: [name] Other Clinician: Referring [name]: Treating [name]/Extender: [name] in Treatment: 13 Multidisciplinary Care Plan reviewed with physician Active Inactive Abuse / Safety / Falls / Self Care Management Nursing Diagnoses: Impaired home maintenance Impaired physical mobility Potential for falls Goals: Patient/caregiver will verbalize/demonstrate measure taken to improve self care Date Initiated: 03/10/2023 Target Resolution Date: 07/10/2023 Goal Status: Active Patient/caregiver will verbalize/demonstrate measures taken to improve the patient's personal safety Date Initiated: 03/10/2023 Target Resolution Date: 07/10/2023 Goal Status: Active Interventions: Assess fall risk on admission and as needed Assess: immobility, friction, shearing, incontinence upon admission and as needed Provide education on fall prevention Notes: Wound/Skin Impairment Nursing  Diagnoses: Impaired tissue integrity Knowledge deficit related to ulceration/compromised skin integrity Goals: Patient/caregiver will verbalize understanding of skin care regimen Date Initiated: 03/10/2023 Target Resolution Date: 07/10/2023 Goal Status: Active Interventions: Assess patient/caregiver ability to obtain necessary supplies Assess patient/caregiver ability to perform ulcer/skin care regimen upon admission and as needed Assess ulceration(s) every visit Treatment Activities: Skin care regimen initiated : 03/10/2023 Topical wound management initiated : 03/10/2023 Notes: Electronic Signature(s) Signed: 06/11/2023 4:38:08 PM By: Zenaida Deed RN, BSN Entered By: Zenaida Deed on 06/11/2023 12:30:36 [name], [name] ([id number]) [id number] Page 8 of 17 --------------------------------------------------------------------------------  of Service: [name], [name] 06/11/2023 2:45 PM Medical Record Number: [id number] Patient Account Number: [id number] Date of Birth/Sex: Treating RN: Megan [birth year], [name] (89  y.o. F) Primary Care Madaleine Simmon: [name] Other Clinician: Referring Keyry Iracheta: Treating Stepheni Cameron/Extender: Claudie Leach Weeks in Treatment: 13 Wound Status Wound Number: 11 Primary Pressure Ulcer Etiology: Wound Location: Right T Second oe Wound Open Wounding Event: Pressure Injury Status: Date Acquired: 10/09/2022 Comorbid Cataracts, Anemia, Arrhythmia, Congestive Heart Failure, Weeks Of Treatment: 13 History: Hypotension, Peripheral Venous Disease, Osteomyelitis, Clustered Wound: No Neuropathy Photos [name], [name] ([id number]) [id number] Page 11 of 17 Wound Measurements Length: (cm) 0.5 Width: (cm) 0.6 Depth: (cm) 0.1 Area: (cm) 0.236 Volume: (cm) 0.024 % Reduction in Area: 66.6% % Reduction in Volume: 66.2% Epithelialization: None Tunneling: No Undermining: No Wound Description Classification: Category/Stage IV Wound Margin: Distinct, outline attached Exudate Amount: Medium Exudate Type: Serosanguineous Exudate Color: red, brown Foul Odor After Cleansing: No Slough/Fibrino Yes Wound Bed Granulation Amount: None Present (0%) Exposed Structure Necrotic Amount: Medium (34-66%) Fascia Exposed: No Necrotic Quality: Adherent Slough Fat Layer (Subcutaneous Tissue) Exposed: Yes Tendon Exposed: No Muscle Exposed: No Joint Exposed: Yes Bone Exposed: Yes Periwound Skin Texture Texture Color No Abnormalities Noted: Yes No Abnormalities Noted: Yes Moisture Temperature / Pain No Abnormalities Noted: Yes Temperature: No Abnormality Tenderness on Palpation: Yes Treatment Notes Wound #11 (Toe Second) Wound Laterality: Right Cleanser Soap and Water Discharge Instruction: Megan shower and wash wound with dial antibacterial soap and water prior to dressing change. Wound Cleanser Discharge Instruction: Cleanse the wound with wound cleanser prior to applying a clean dressing using gauze sponges, not tissue or cotton  balls. Peri-Wound Care Topical Primary Dressing Promogran Prisma Matrix, 4.34 (sq in) (silver collagen) Discharge Instruction: Moisten collagen with saline or hydrogel Secondary Dressing Woven Gauze Sponge, Non-Sterile 4x4 in Discharge Instruction: Apply over primary dressing as directed. Secured With Conforming Stretch Gauze Bandage, Sterile 2x75 (in/in) Discharge Instruction: Secure with stretch gauze as directed. 37M Medipore H Soft Cloth Surgical T ape, 4 x 10 (in/yd) Discharge Instruction: Secure with tape as directed. Compression [address] Preesha M ([id number]) [id number] Page 12 of 17 Compression Stockings Add-Ons Electronic Signature(s) Signed: 06/11/2023 4:38:08 PM By: Zenaida Deed RN, BSN Entered By: Zenaida Deed on 06/11/2023 12:26:09 -------------------------------------------------------------------------------- Wound Assessment Details Patient Name: Date of Service: [name]. 06/11/2023 2:45 PM Medical Record Number: [id number] Patient Account Number: [id number] Date of Birth/Sex: Treating RN: [birth year] [name] (89 y.o. 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 Great oe Wound Open Wounding Event: Gradually Appeared Status: Date Acquired: 03/17/2023 Comorbid Cataracts, Anemia, Arrhythmia, Congestive Heart Failure, Weeks Of Treatment: 12 History: Hypotension, Peripheral Venous Disease, Osteomyelitis, Clustered Wound: No Neuropathy Photos Wound Measurements Length: (cm) Width: (cm) Depth: (cm) Area: (cm) Volume: (cm) 0 % Reduction in Area: 100% 0 % Reduction in Volume: 100% 0 Epithelialization: Large (67-100%) 0 Tunneling: No 0 Undermining: No Wound Description Classification: Full Thickness  Without Exposed Suppo Wound Margin: Flat and Intact Exudate Amount: None Present rt Structures Foul Odor After Cleansing: No Slough/Fibrino No Wound Bed Granulation Amount: None Present (0%) Exposed Structure Necrotic Amount: None Present (0%) Fascia Exposed: No Fat Layer (Subcutaneous Tissue) Exposed: No Tendon Exposed: No Muscle Exposed: No Joint Exposed: No Bone Exposed: No Periwound Skin Texture Texture Color No Abnormalities Noted: No No Abnormalities Noted: Yes Callus:  MIKKI, ZIFF ([id number]) [id number] Page 1 of 17 Visit Report for 06/11/2023 Arrival Information Details Patient Name: Date of Service: LARRY, KNIPP 06/11/2023 2:45 PM Medical Record Number: [id number] Patient Account Number: [id number] Date of Birth/Sex: Treating RN: [name][birth year] (89 y.o. F) Primary Care Daleyza Gadomski: [name] Other Clinician: Referring Teryl Gubler: Treating Haide Klinker/Extender: [name] in Treatment: 13 Visit Information History Since Last Visit Added or deleted any medications: No Patient Arrived: Wheel Chair Any new allergies or adverse reactions: No Arrival Time: 15:00 Had a fall or experienced change in No Accompanied By: daughter activities of daily living that Megan affect Transfer Assistance: None risk of falls: Patient Identification Verified: Yes Signs or symptoms of abuse/neglect since last visito No Secondary Verification Process Completed: Yes Hospitalized since last visit: No Patient Requires Transmission-Based Precautions: No Implantable device outside of the clinic excluding No Patient Has Alerts: Yes cellular tissue based products placed in the center Patient Alerts: ABI R: 0.48 L: 0.46 2/24 since last visit: TBI R: 0.39 L: 0.21 2/24 Has Dressing in Place as Prescribed: Yes Pain Present Now: No Electronic Signature(s) Signed: 06/11/2023 4:38:08 PM By: Zenaida Deed RN, BSN Entered By: Zenaida Deed on 06/11/2023 12:17:49 -------------------------------------------------------------------------------- Encounter Discharge Information Details Patient Name: Date of Service: [name]. 06/11/2023 2:45 PM Medical Record Number: [id number] Patient Account Number: [id number] Date of Birth/Sex: Treating RN: [name] (89 y.o. [name] Primary Care Dalasia Predmore: [name] Other Clinician: Referring Kimberlynn Lumbra: Treating Jenie Parish/Extender: Claudie Leach Weeks in  Treatment: 13 Encounter Discharge Information Items Post Procedure Vitals Discharge Condition: Stable Temperature (F): 98 Ambulatory Status: Wheelchair Pulse (bpm): 79 Discharge Destination: Home Respiratory Rate (breaths/min): 18 Transportation: Private Auto Blood Pressure (mmHg): 126/62 Accompanied By: daughter Schedule Follow-up Appointment: Yes Clinical Summary of Care: Patient Declined Electronic Signature(s) Signed: 06/11/2023 4:38:08 PM By: Zenaida Deed RN, BSN Entered By: Zenaida Deed on 06/11/2023 13:09:50 Hanners, [name] ([id number]) [id number] Page 2 of 17 -------------------------------------------------------------------------------- Lower Extremity Assessment Details Patient Name: Date of Service: [name], [name] 06/11/2023 2:45 PM Medical Record Number: [id number] Patient Account Number: [id number] Date of Birth/Sex: Treating RN: [birth year] [name] (89 y.o. [name] Primary Care Tashema Tiller: [name] Other Clinician: Referring Naija Troost: Treating Ermine Stebbins/Extender: Claudie Leach Weeks in Treatment: 13 Edema Assessment Assessed: [Left: No] [Right: No] Edema: [Left: Yes] [Right: Yes] Calf Left: Right: Point of Measurement: From Medial Instep 39 cm 36.5 cm Ankle Left: Right: Point of Measurement: From Medial Instep 24.5 cm 24 cm Vascular Assessment Pulses: Dorsalis Pedis Palpable: [Left:No] [Right:No] Extremity colors, hair growth, and conditions: Extremity Color: [Left:Hyperpigmented] [Right:Hyperpigmented] Hair Growth on Extremity: [Left:No] [Right:No] Temperature of Extremity: [Left:Warm < 3 seconds] [Right:Warm < 3 seconds] Electronic Signature(s) Signed: 06/11/2023 4:38:08 PM By: Zenaida Deed RN, BSN Entered By: Zenaida Deed on 06/11/2023 12:24:53 -------------------------------------------------------------------------------- Multi Wound Chart Details Patient Name: Date of Service: [name]. 06/11/2023 2:45 PM Medical Record Number: [id number] Patient Account Number: [id number] Date of Birth/Sex: Treating RN: [name][birth year] (89 y.o. F) Primary Care Ashlan Dignan: [name] Other Clinician: Referring Jayme Mednick: Treating Engelbert Sevin/Extender: Claudie Leach Weeks in Treatment: 13 Vital Signs Height(in): 64 Pulse(bpm): 79 Weight(lbs): 157 Blood Pressure(mmHg): 126/62 Body Mass Index(BMI): 26.9 Temperature(F): 98.0 Respiratory Rate(breaths/min): 18 [10:Photos:] [[id number] Page 3 of 17] Left T Second oe Right T Second oe Left T Great oe Wound Location: Pressure Injury Pressure Injury Gradually Appeared Wounding Event: Pressure Ulcer Pressure Ulcer Arterial Insufficiency Ulcer Primary Etiology: Cataracts, Anemia, Arrhythmia, Cataracts, Anemia, Arrhythmia,  Yes Temperature / Pain Temperature: No Abnormality Moisture Accomando, Falicity M ([id number]) [id number] Page 13 of 17 No Abnormalities Noted: Yes Tenderness on Palpation: Yes Electronic Signature(s) Signed: 06/11/2023 4:38:08 PM By: Zenaida Deed RN, BSN Entered By: Zenaida Deed on 06/11/2023 12:27:05 -------------------------------------------------------------------------------- Wound Assessment Details Patient Name: Date of Service: [name]. 06/11/2023 2:45 PM Medical Record Number: [id number] Patient Account Number: [id number] Date of Birth/Sex: Treating RN: [birth year] (89 y.o. F) Primary Care King Pinzon: [name] Other Clinician: Referring Kiauna Zywicki: Treating Calan Doren/Extender: Claudie Leach Weeks in Treatment: 13 Wound Status Wound Number: 13 Primary Arterial Insufficiency Ulcer Etiology: Wound Location: Left, Dorsal T Great oe Wound Open Wounding Event: Gradually Appeared Status: Date Acquired: 03/24/2023 Comorbid Cataracts, Anemia, Arrhythmia, Congestive Heart Failure, Weeks Of Treatment: 11 History: Hypotension, Peripheral Venous Disease, Osteomyelitis, Clustered Wound: No Neuropathy Photos Wound Measurements Length: (cm) Width: (cm) Depth: (cm) Area: (cm) Volume: (cm) 1 % Reduction in Area: -1292.4% 1.4 % Reduction in Volume: -5400% 0.4 Epithelialization: None 1.1 Tunneling: No 0.44 Undermining: No Wound  Description Classification: Full Thickness With Exposed Supp Wound Margin: Distinct, outline attached Exudate Amount: Medium Exudate Type: Serosanguineous Exudate Color: red, brown ort Structures Foul Odor After Cleansing: No Slough/Fibrino Yes Wound Bed Granulation Amount: Small (1-33%) Exposed Structure Granulation Quality: Red Fat Layer (Subcutaneous Tissue) Exposed: Yes Necrotic Amount: Medium (34-66%) Tendon Exposed: Yes Necrotic Quality: Adherent Slough Bone Exposed: Yes Periwound Skin Texture Texture Color No Abnormalities Noted: Yes No Abnormalities Noted: Yes Moisture Temperature / Pain No Abnormalities Noted: Yes Temperature: No Abnormality Tenderness on Palpation: Yes [name], [name] ([id number]) [id number] Page 14 of 17 Treatment Notes Wound #13 (Toe Great) Wound Laterality: Dorsal, Left Cleanser Soap and Water Discharge Instruction: Megan shower and wash wound with dial antibacterial soap and water prior to dressing change. Wound Cleanser Discharge Instruction: Cleanse the wound with wound cleanser prior to applying a clean dressing using gauze sponges, not tissue or cotton balls. Peri-Wound Care Topical Primary Dressing Promogran Prisma Matrix, 4.34 (sq in) (silver collagen) Discharge Instruction: Moisten collagen with saline or hydrogel Secondary Dressing Woven Gauze Sponge, Non-Sterile 4x4 in Discharge Instruction: Apply over primary dressing as directed. Secured With 61M Medipore H Soft Cloth Surgical T ape, 4 x 10 (in/yd) Discharge Instruction: Secure with tape as directed. Compression Wrap Compression Stockings Add-Ons Electronic Signature(s) Signed: 06/11/2023 4:38:08 PM By: Zenaida Deed RN, BSN Entered By: Zenaida Deed on 06/11/2023 12:27:48 -------------------------------------------------------------------------------- Wound Assessment Details Patient Name: Date of Service: [name]. 06/11/2023 2:45  PM Medical Record Number: [id number] Patient Account Number: [id number] Date of Birth/Sex: Treating RN: [birth year]/[name] (89 y.o. 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, Dorsal T Great oe Wound Open Wounding Event: Gradually Appeared Status: Date Acquired: 03/26/2023 Comorbid Cataracts, Anemia, Arrhythmia, Congestive Heart Failure, Weeks Of Treatment: 11 History: Hypotension, Peripheral Venous Disease, Osteomyelitis, Clustered Wound: No Neuropathy Photos Bagshaw, [name] ([id number]) [id number] Page 15 of 17 Wound Measurements Length: (cm) 0.7 Width: (cm) 0.5 Depth: (cm) 0.1 Area: (cm) 0.275 Volume: (cm) 0.027 % Reduction in Area: -287.3% % Reduction in Volume: -285.7% Epithelialization: None Tunneling: No Undermining: No Wound Description Classification: Full Thickness Without Exposed Support Structures Wound Margin: Flat and Intact Exudate Amount: Medium Exudate Type: Serosanguineous Exudate Color: red, brown Foul Odor After Cleansing: No Slough/Fibrino No Wound Bed Granulation Amount: Large (67-100%) Exposed Structure Granulation Quality: Red  Yes Temperature / Pain Temperature: No Abnormality Moisture Accomando, Falicity M ([id number]) [id number] Page 13 of 17 No Abnormalities Noted: Yes Tenderness on Palpation: Yes Electronic Signature(s) Signed: 06/11/2023 4:38:08 PM By: Zenaida Deed RN, BSN Entered By: Zenaida Deed on 06/11/2023 12:27:05 -------------------------------------------------------------------------------- Wound Assessment Details Patient Name: Date of Service: [name]. 06/11/2023 2:45 PM Medical Record Number: [id number] Patient Account Number: [id number] Date of Birth/Sex: Treating RN: [birth year] (89 y.o. F) Primary Care King Pinzon: [name] Other Clinician: Referring Kiauna Zywicki: Treating Calan Doren/Extender: Claudie Leach Weeks in Treatment: 13 Wound Status Wound Number: 13 Primary Arterial Insufficiency Ulcer Etiology: Wound Location: Left, Dorsal T Great oe Wound Open Wounding Event: Gradually Appeared Status: Date Acquired: 03/24/2023 Comorbid Cataracts, Anemia, Arrhythmia, Congestive Heart Failure, Weeks Of Treatment: 11 History: Hypotension, Peripheral Venous Disease, Osteomyelitis, Clustered Wound: No Neuropathy Photos Wound Measurements Length: (cm) Width: (cm) Depth: (cm) Area: (cm) Volume: (cm) 1 % Reduction in Area: -1292.4% 1.4 % Reduction in Volume: -5400% 0.4 Epithelialization: None 1.1 Tunneling: No 0.44 Undermining: No Wound  Description Classification: Full Thickness With Exposed Supp Wound Margin: Distinct, outline attached Exudate Amount: Medium Exudate Type: Serosanguineous Exudate Color: red, brown ort Structures Foul Odor After Cleansing: No Slough/Fibrino Yes Wound Bed Granulation Amount: Small (1-33%) Exposed Structure Granulation Quality: Red Fat Layer (Subcutaneous Tissue) Exposed: Yes Necrotic Amount: Medium (34-66%) Tendon Exposed: Yes Necrotic Quality: Adherent Slough Bone Exposed: Yes Periwound Skin Texture Texture Color No Abnormalities Noted: Yes No Abnormalities Noted: Yes Moisture Temperature / Pain No Abnormalities Noted: Yes Temperature: No Abnormality Tenderness on Palpation: Yes [name], [name] ([id number]) [id number] Page 14 of 17 Treatment Notes Wound #13 (Toe Great) Wound Laterality: Dorsal, Left Cleanser Soap and Water Discharge Instruction: Megan shower and wash wound with dial antibacterial soap and water prior to dressing change. Wound Cleanser Discharge Instruction: Cleanse the wound with wound cleanser prior to applying a clean dressing using gauze sponges, not tissue or cotton balls. Peri-Wound Care Topical Primary Dressing Promogran Prisma Matrix, 4.34 (sq in) (silver collagen) Discharge Instruction: Moisten collagen with saline or hydrogel Secondary Dressing Woven Gauze Sponge, Non-Sterile 4x4 in Discharge Instruction: Apply over primary dressing as directed. Secured With 61M Medipore H Soft Cloth Surgical T ape, 4 x 10 (in/yd) Discharge Instruction: Secure with tape as directed. Compression Wrap Compression Stockings Add-Ons Electronic Signature(s) Signed: 06/11/2023 4:38:08 PM By: Zenaida Deed RN, BSN Entered By: Zenaida Deed on 06/11/2023 12:27:48 -------------------------------------------------------------------------------- Wound Assessment Details Patient Name: Date of Service: [name]. 06/11/2023 2:45  PM Medical Record Number: [id number] Patient Account Number: [id number] Date of Birth/Sex: Treating RN: [birth year][name] (89 y.o. 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, Dorsal T Great oe Wound Open Wounding Event: Gradually Appeared Status: Date Acquired: 03/26/2023 Comorbid Cataracts, Anemia, Arrhythmia, Congestive Heart Failure, Weeks Of Treatment: 11 History: Hypotension, Peripheral Venous Disease, Osteomyelitis, Clustered Wound: No Neuropathy Photos Bagshaw, [name] ([id number]) [id number] Page 15 of 17 Wound Measurements Length: (cm) 0.7 Width: (cm) 0.5 Depth: (cm) 0.1 Area: (cm) 0.275 Volume: (cm) 0.027 % Reduction in Area: -287.3% % Reduction in Volume: -285.7% Epithelialization: None Tunneling: No Undermining: No Wound Description Classification: Full Thickness Without Exposed Support Structures Wound Margin: Flat and Intact Exudate Amount: Medium Exudate Type: Serosanguineous Exudate Color: red, brown Foul Odor After Cleansing: No Slough/Fibrino No Wound Bed Granulation Amount: Large (67-100%) Exposed Structure Granulation Quality: Red  Non-Sterile 4x4 in Discharge Instruction: Apply over primary dressing as directed. Secured With 669M Medipore H Soft Cloth Surgical T ape, 4 x 10 (in/yd) Discharge Instruction: Secure with tape as directed. [name], [name] ([id number]) [id number] Page 6 of 17 Compression Wrap Compression Stockings Add-Ons Wound #15 (Toe Great) Wound Laterality: Dorsal, Right Cleanser Soap and Water Discharge Instruction: Megan shower and wash wound with dial antibacterial soap and water prior to dressing change. Wound Cleanser Discharge Instruction: Cleanse the wound with wound cleanser prior to applying a clean dressing using gauze sponges, not tissue or cotton balls. Peri-Wound Care Topical Primary Dressing Promogran Prisma Matrix, 4.34 (sq in) (silver collagen) Discharge Instruction: Moisten collagen with saline or hydrogel Secondary Dressing Woven Gauze Sponge, Non-Sterile 4x4 in Discharge Instruction: Apply over primary dressing as directed. Secured With Conforming Stretch Gauze Bandage, Sterile 2x75 (in/in) Discharge Instruction: Secure with stretch gauze as directed. 669M Medipore H Soft Cloth Surgical T ape, 4 x 10 (in/yd) Discharge Instruction: Secure with tape as directed. Compression Wrap Compression Stockings Add-Ons Wound #9 (Calcaneus) Wound Laterality: Left Cleanser Soap and Water Discharge Instruction: Megan shower and wash wound with dial antibacterial soap and water prior to dressing change. Wound Cleanser Discharge Instruction: Cleanse the wound with wound cleanser prior to applying a clean dressing using gauze sponges, not tissue or cotton balls. Peri-Wound Care Topical Primary Dressing Promogran Prisma Matrix, 4.34 (sq in) (silver collagen) Discharge Instruction: Moisten collagen with saline or hydrogel Secondary Dressing ALLEVYN Heel 4 1/2in x 5 1/2in /  10.5cm x 13.5cm Discharge Instruction: Apply over primary dressing as directed. Woven Gauze Sponge, Non-Sterile 4x4 in Discharge Instruction: Apply over primary dressing as directed. Secured With [employer], 4.5x3.1 (in/yd) Discharge Instruction: Secure with Kerlix as directed. 669M Medipore H Soft Cloth Surgical T ape, 4 x 10 (in/yd) Discharge Instruction: Secure with tape as directed. Compression Wrap Compression Stockings Add-Ons [name], [name] ([id number]) [id number] Page 7 of 17 Electronic Signature(s) Signed: 06/11/2023 4:16:41 PM By: Duanne Guess MD FACS Entered By: Duanne Guess on 06/11/2023 13:16:40 -------------------------------------------------------------------------------- Multi-Disciplinary Care Plan Details Patient Name: Date of Service: [name]. 06/11/2023 2:45 PM Medical Record Number: [id number] Patient Account Number: [id number] Date of Birth/Sex: Treating RN: [name] (89 y.o. [name] Primary Care [name]: [name] Other Clinician: Referring [name]: Treating [name]/Extender: [name] in Treatment: 13 Multidisciplinary Care Plan reviewed with physician Active Inactive Abuse / Safety / Falls / Self Care Management Nursing Diagnoses: Impaired home maintenance Impaired physical mobility Potential for falls Goals: Patient/caregiver will verbalize/demonstrate measure taken to improve self care Date Initiated: 03/10/2023 Target Resolution Date: 07/10/2023 Goal Status: Active Patient/caregiver will verbalize/demonstrate measures taken to improve the patient's personal safety Date Initiated: 03/10/2023 Target Resolution Date: 07/10/2023 Goal Status: Active Interventions: Assess fall risk on admission and as needed Assess: immobility, friction, shearing, incontinence upon admission and as needed Provide education on fall prevention Notes: Wound/Skin Impairment Nursing  Diagnoses: Impaired tissue integrity Knowledge deficit related to ulceration/compromised skin integrity Goals: Patient/caregiver will verbalize understanding of skin care regimen Date Initiated: 03/10/2023 Target Resolution Date: 07/10/2023 Goal Status: Active Interventions: Assess patient/caregiver ability to obtain necessary supplies Assess patient/caregiver ability to perform ulcer/skin care regimen upon admission and as needed Assess ulceration(s) every visit Treatment Activities: Skin care regimen initiated : 03/10/2023 Topical wound management initiated : 03/10/2023 Notes: Electronic Signature(s) Signed: 06/11/2023 4:38:08 PM By: Zenaida Deed RN, BSN Entered By: Zenaida Deed on 06/11/2023 12:30:36 [name], [name] ([id number]) [id number] Page 8 of 17 --------------------------------------------------------------------------------  Fascia Exposed: No Necrotic Amount: Small (1-33%) Fat Layer (Subcutaneous Tissue) Exposed: Yes Necrotic Quality: Eschar, Adherent Slough Tendon Exposed: No Muscle Exposed: No Joint Exposed: No Bone Exposed: No Periwound Skin Texture Texture Color No Abnormalities Noted: Yes No Abnormalities Noted: Yes Moisture Temperature / Pain No Abnormalities Noted: Yes Temperature: No Abnormality Tenderness on Palpation: Yes Treatment Notes Wound #15 (Toe Great) Wound Laterality: Dorsal, Right Cleanser Soap and Water Discharge Instruction: Megan shower and wash wound with dial antibacterial soap  and water prior to dressing change. Wound Cleanser Discharge Instruction: Cleanse the wound with wound cleanser prior to applying a clean dressing using gauze sponges, not tissue or cotton balls. Peri-Wound Care Topical Primary Dressing Promogran Prisma Matrix, 4.34 (sq in) (silver collagen) Discharge Instruction: Moisten collagen with saline or hydrogel Secondary Dressing Woven Gauze Sponge, Non-Sterile 4x4 in Discharge Instruction: Apply over primary dressing as directed. Secured With Conforming Stretch Gauze Bandage, Sterile 2x75 (in/in) Discharge Instruction: Secure with stretch gauze as directed. 77M Medipore H Soft Cloth Surgical T ape, 4 x 10 (in/yd) Discharge Instruction: Secure with tape as directed. Compression Wrap Compression Stockings Add-Ons Electronic Signature(s) Signed: 06/11/2023 4:38:08 PM By: Zenaida Deed RN, BSN Entered By: Zenaida Deed on 06/11/2023 12:28:46 [name], [name] ([id number]) [id number] Page 16 of 17 -------------------------------------------------------------------------------- Wound Assessment Details Patient Name: Date of Service: [name], [name] 06/11/2023 2:45 PM Medical Record Number: [id number] Patient Account Number: [id number] Date of Birth/Sex: Treating RN: [birth year] [name] (89 y.o. F) Primary Care Marsela Kuan: [name] Other Clinician: Referring Anissia Wessells: Treating [place] Arabie/Extender: Claudie Leach Weeks in Treatment: 13 Wound Status Wound Number: 9 Primary Pressure Ulcer Etiology: Wound Location: Left Calcaneus Wound Open Wounding Event: Pressure Injury Status: Date Acquired: 10/22/2022 Comorbid Cataracts, Anemia, Arrhythmia, Congestive Heart Failure, Weeks Of Treatment: 13 History: Hypotension, Peripheral Venous Disease, Osteomyelitis, Clustered Wound: No Neuropathy Photos Wound Measurements Length: (cm) Width: (cm) Depth: (cm) Area: (cm) Volume: (cm) 0.4 % Reduction in Area:  61.8% 0.4 % Reduction in Volume: 49.5% 0.4 Epithelialization: Small (1-33%) 0.126 Tunneling: No 0.05 Undermining: No Wound Description Classification: Category/Stage II Wound Margin: Distinct, outline attached Exudate Amount: Small Exudate Type: Serous Exudate Color: amber Foul Odor After Cleansing: No Slough/Fibrino Yes Wound Bed Granulation Amount: None Present (0%) Exposed Structure Necrotic Amount: Large (67-100%) Fascia Exposed: No Necrotic Quality: Eschar, Adherent Slough Fat Layer (Subcutaneous Tissue) Exposed: Yes Tendon Exposed: No Muscle Exposed: No Joint Exposed: No Bone Exposed: No Periwound Skin Texture Texture Color No Abnormalities Noted: No No Abnormalities Noted: Yes Callus: Yes Temperature / Pain Temperature: No Abnormality Moisture No Abnormalities Noted: Yes Tenderness on Palpation: Yes Treatment Notes Wound #9 (Calcaneus) Wound Laterality: Left Cleanser Soap and Water [place], Dominique M ([id number]) [id number] Page [phone] of 17 Discharge Instruction: Megan shower and wash wound with dial antibacterial soap and water prior to dressing change. Wound Cleanser Discharge Instruction: Cleanse the wound with wound cleanser prior to applying a clean dressing using gauze sponges, not tissue or cotton balls. Peri-Wound Care Topical Primary Dressing Promogran Prisma Matrix, 4.34 (sq in) (silver collagen) Discharge Instruction: Moisten collagen with saline or hydrogel Secondary Dressing ALLEVYN Heel 4 1/2in x 5 1/2in / 10.5cm x 13.5cm Discharge Instruction: Apply over primary dressing as directed. Woven Gauze Sponge, Non-Sterile 4x4 in Discharge Instruction: Apply over primary dressing as directed. Secured With [employer], 4.5x3.1 (in/yd) Discharge Instruction: Secure with Kerlix as directed. 77M Medipore H Soft Cloth Surgical T ape, 4 x 10 (in/yd) Discharge Instruction: Secure with tape as directed. Compression Wrap Compression  of Service: [name], [name] 06/11/2023 2:45 PM Medical Record Number: [id number] Patient Account Number: [id number] Date of Birth/Sex: Treating RN: Megan [birth year], [name] (89  y.o. F) Primary Care Madaleine Simmon: [name] Other Clinician: Referring Keyry Iracheta: Treating Stepheni Cameron/Extender: Claudie Leach Weeks in Treatment: 13 Wound Status Wound Number: 11 Primary Pressure Ulcer Etiology: Wound Location: Right T Second oe Wound Open Wounding Event: Pressure Injury Status: Date Acquired: 10/09/2022 Comorbid Cataracts, Anemia, Arrhythmia, Congestive Heart Failure, Weeks Of Treatment: 13 History: Hypotension, Peripheral Venous Disease, Osteomyelitis, Clustered Wound: No Neuropathy Photos [name], [name] ([id number]) [id number] Page 11 of 17 Wound Measurements Length: (cm) 0.5 Width: (cm) 0.6 Depth: (cm) 0.1 Area: (cm) 0.236 Volume: (cm) 0.024 % Reduction in Area: 66.6% % Reduction in Volume: 66.2% Epithelialization: None Tunneling: No Undermining: No Wound Description Classification: Category/Stage IV Wound Margin: Distinct, outline attached Exudate Amount: Medium Exudate Type: Serosanguineous Exudate Color: red, brown Foul Odor After Cleansing: No Slough/Fibrino Yes Wound Bed Granulation Amount: None Present (0%) Exposed Structure Necrotic Amount: Medium (34-66%) Fascia Exposed: No Necrotic Quality: Adherent Slough Fat Layer (Subcutaneous Tissue) Exposed: Yes Tendon Exposed: No Muscle Exposed: No Joint Exposed: Yes Bone Exposed: Yes Periwound Skin Texture Texture Color No Abnormalities Noted: Yes No Abnormalities Noted: Yes Moisture Temperature / Pain No Abnormalities Noted: Yes Temperature: No Abnormality Tenderness on Palpation: Yes Treatment Notes Wound #11 (Toe Second) Wound Laterality: Right Cleanser Soap and Water Discharge Instruction: Megan shower and wash wound with dial antibacterial soap and water prior to dressing change. Wound Cleanser Discharge Instruction: Cleanse the wound with wound cleanser prior to applying a clean dressing using gauze sponges, not tissue or cotton  balls. Peri-Wound Care Topical Primary Dressing Promogran Prisma Matrix, 4.34 (sq in) (silver collagen) Discharge Instruction: Moisten collagen with saline or hydrogel Secondary Dressing Woven Gauze Sponge, Non-Sterile 4x4 in Discharge Instruction: Apply over primary dressing as directed. Secured With Conforming Stretch Gauze Bandage, Sterile 2x75 (in/in) Discharge Instruction: Secure with stretch gauze as directed. 37M Medipore H Soft Cloth Surgical T ape, 4 x 10 (in/yd) Discharge Instruction: Secure with tape as directed. Compression [address] Preesha M ([id number]) [id number] Page 12 of 17 Compression Stockings Add-Ons Electronic Signature(s) Signed: 06/11/2023 4:38:08 PM By: Zenaida Deed RN, BSN Entered By: Zenaida Deed on 06/11/2023 12:26:09 -------------------------------------------------------------------------------- Wound Assessment Details Patient Name: Date of Service: [name]. 06/11/2023 2:45 PM Medical Record Number: [id number] Patient Account Number: [id number] Date of Birth/Sex: Treating RN: [birth year] [name] (89 y.o. 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 Great oe Wound Open Wounding Event: Gradually Appeared Status: Date Acquired: 03/17/2023 Comorbid Cataracts, Anemia, Arrhythmia, Congestive Heart Failure, Weeks Of Treatment: 12 History: Hypotension, Peripheral Venous Disease, Osteomyelitis, Clustered Wound: No Neuropathy Photos Wound Measurements Length: (cm) Width: (cm) Depth: (cm) Area: (cm) Volume: (cm) 0 % Reduction in Area: 100% 0 % Reduction in Volume: 100% 0 Epithelialization: Large (67-100%) 0 Tunneling: No 0 Undermining: No Wound Description Classification: Full Thickness  Without Exposed Suppo Wound Margin: Flat and Intact Exudate Amount: None Present rt Structures Foul Odor After Cleansing: No Slough/Fibrino No Wound Bed Granulation Amount: None Present (0%) Exposed Structure Necrotic Amount: None Present (0%) Fascia Exposed: No Fat Layer (Subcutaneous Tissue) Exposed: No Tendon Exposed: No Muscle Exposed: No Joint Exposed: No Bone Exposed: No Periwound Skin Texture Texture Color No Abnormalities Noted: No No Abnormalities Noted: Yes Callus:

## 2023-06-11 NOTE — Progress Notes (Addendum)
Megan, [name] ([id number]) [id number] Page 1 of 17 Visit Report for 06/11/2023 Chief Complaint Document Details Patient Name: Date of Service: [name], [name] 06/11/2023 2:45 PM Medical Record Number: [id number] Patient Account Number: [id number] Date of Birth/Sex: Treating RN: [name] (89 y.o. F) Primary Care Provider: [name] Other Clinician: Referring Provider: Treating Provider/Extender: Tiajuana Amass in Treatment: 13 Information Obtained from: Patient Chief Complaint 5/[name]/2023; right second toe amputation site dehiscence and bilateral lower extremity wounds. 03/10/2023: pressure ulcers of right heel, ulcers on toes Electronic Signature(s) Signed: 06/11/2023 4:16:47 PM By: Duanne Guess MD FACS Entered By: Duanne Guess on 06/11/2023 13:16:47 -------------------------------------------------------------------------------- Debridement Details Patient Name: Date of Service: [name]. 06/11/2023 2:45 PM Medical Record Number: [id number] Patient Account Number: [id number] Date of Birth/Sex: Treating RN: [name], [name] (89 y.o. [name] Primary Care Provider: [name] Other Clinician: Referring Provider: Treating Provider/Extender: Claudie Leach Weeks in Treatment: 13 Debridement Performed for Assessment: Wound #13 Left,Dorsal T Great oe Performed By: Physician Duanne Guess, MD The following information was scribed by: Zenaida Deed The information was scribed for: Duanne Guess Debridement Type: Debridement Severity of Tissue Pre Debridement: Necrosis of bone Level of Consciousness (Pre-procedure): Awake and Alert Pre-procedure Verification/Time Out Yes - 15:45 Taken: Start Time: 15:18 Pain Control: Lidocaine 4% T opical Solution Percent of Wound Bed Debrided: 100% T Area Debrided (cm): otal 1.1 Tissue and other material debrided: Non-Viable, Slough, Slough Level:  Non-Viable Tissue Debridement Description: Selective/Open Wound Instrument: Curette Bleeding: None Procedural Pain: 0 Post Procedural Pain: 0 Response to Treatment: Procedure was tolerated well Level of Consciousness (Post- Awake and Alert procedure): Post Debridement Measurements of Total Wound Length: (cm) 1 Width: (cm) 1.4 Depth: (cm) 0.1 Gagen, [name] ([id number]) [id number] Page 2 of 17 Volume: (cm) 0.11 Character of Wound/Ulcer Post Debridement: Stable Severity of Tissue Post Debridement: Necrosis of bone Post Procedure Diagnosis Same as Pre-procedure Electronic Signature(s) Signed: 06/11/2023 4:30:35 PM By: Duanne Guess MD FACS Signed: 06/11/2023 4:38:08 PM By: Zenaida Deed RN, BSN Entered By: Zenaida Deed on 06/11/2023 12:50:06 -------------------------------------------------------------------------------- Debridement Details Patient Name: Date of Service: [name]. 06/11/2023 2:45 PM Medical Record Number: [id number] Patient Account Number: [id number] Date of Birth/Sex: Treating RN: [name], [name] (89 y.o. [name] Primary Care Provider: [name] Other Clinician: Referring Provider: Treating Provider/Extender: Claudie Leach Weeks in Treatment: 13 Debridement Performed for Assessment: Wound #11 Right T Second oe Performed By: Physician Duanne Guess, MD The following information was scribed by: Zenaida Deed The information was scribed for: Duanne Guess Debridement Type: Debridement Level of Consciousness (Pre-procedure): Awake and Alert Pre-procedure Verification/Time Out Yes - 15:45 Taken: Start Time: 15:18 Pain Control: Lidocaine 4% T opical Solution Percent of Wound Bed Debrided: 100% T Area Debrided (cm): otal 0.24 Tissue and other material debrided: Non-Viable, Slough, Slough Level: Non-Viable Tissue Debridement Description: Selective/Open Wound Instrument:  Curette Bleeding: None Procedural Pain: 0 Post Procedural Pain: 0 Response to Treatment: Procedure was tolerated well Level of Consciousness (Post- Awake and Alert procedure): Post Debridement Measurements of Total Wound Length: (cm) 0.5 Stage: Category/Stage IV Width: (cm) 0.6 Depth: (cm) 0.1 Volume: (cm) 0.024 Character of Wound/Ulcer Post Debridement: Stable Post Procedure Diagnosis Same as Pre-procedure Electronic Signature(s) Signed: 06/11/2023 4:30:35 PM By: Duanne Guess MD FACS Signed: 06/11/2023 4:38:08 PM By: Zenaida Deed RN, BSN Entered By: Zenaida Deed on 06/11/2023 12:50:57 [name], [name] ([id number]) [id number] Page 3 of 17 -------------------------------------------------------------------------------- Debridement Details Patient Name: Date of Service: [name],  [name] 06/11/2023 2:45 PM Medical Record Number: [id number] Patient Account Number: [id number] Date of Birth/Sex: Treating RN: [name] (89 y.o. [name] Primary Care Provider: [name] Other Clinician: Referring Provider: Treating Provider/Extender: Claudie Leach Weeks in Treatment: 13 Debridement Performed for Assessment: Wound #15 Right,Dorsal T Great oe Performed By: Physician Duanne Guess, MD The following information was scribed by: Zenaida Deed The information was scribed for: Duanne Guess Debridement Type: Debridement Severity of Tissue Pre Debridement: Necrosis of bone Level of Consciousness (Pre-procedure): Awake and Alert Pre-procedure Verification/Time Out Yes - 15:45 Taken: Start Time: 15:18 Pain Control: Lidocaine 4% T opical Solution Percent of Wound Bed Debrided: 100% T Area Debrided (cm): otal 0.27 Tissue and other material debrided: Non-Viable, Slough, Slough Level: Non-Viable Tissue Debridement Description: Selective/Open Wound Instrument: Curette Bleeding: None Procedural Pain: 0 Post Procedural  Pain: 0 Response to Treatment: Procedure was tolerated well Level of Consciousness (Post- Awake and Alert procedure): Post Debridement Measurements of Total Wound Length: (cm) 0.7 Width: (cm) 0.5 Depth: (cm) 0.1 Volume: (cm) 0.027 Character of Wound/Ulcer Post Debridement: Stable Severity of Tissue Post Debridement: Necrosis of bone Post Procedure Diagnosis Same as Pre-procedure Electronic Signature(s) Signed: 06/11/2023 4:30:35 PM By: Duanne Guess MD FACS Signed: 06/11/2023 4:38:08 PM By: Zenaida Deed RN, BSN Entered By: Zenaida Deed on 06/11/2023 12:53:02 -------------------------------------------------------------------------------- Debridement Details Patient Name: Date of Service: [name]. 06/11/2023 2:45 PM Medical Record Number: [id number] Patient Account Number: [id number] Date of Birth/Sex: Treating RN: [birth year][name] (89 y.o. [name] Primary Care Provider: [name] Other Clinician: Referring Provider: Treating Provider/Extender: Claudie Leach Weeks in Treatment: 13 Debridement Performed for Assessment: Wound #10 Left T Second oe Performed By: Physician Duanne Guess, MD The following information was scribed by: Zenaida Deed The information was scribed for: Duanne Guess Debridement Type: Debridement Level of Consciousness (Pre-procedure): Awake and Alert Pre-procedure Verification/Time Out Yes - 15:45 Taken: Start Time: 15:18 Pain Control: Lidocaine 4% Topical Solution Percent of Wound Bed Debrided: 100% Henney, [name] ([id number]) [id number] Page 4 of 17 T Area Debrided (cm): otal 0.12 Tissue and other material debrided: Non-Viable, Slough, Slough Level: Non-Viable Tissue Debridement Description: Selective/Open Wound Instrument: Curette Bleeding: None Procedural Pain: 0 Post Procedural Pain: 0 Response to Treatment: Procedure was tolerated well Level of Consciousness (Post-  Awake and Alert procedure): Post Debridement Measurements of Total Wound Length: (cm) 0.3 Stage: Category/Stage IV Width: (cm) 0.5 Depth: (cm) 0.1 Volume: (cm) 0.012 Character of Wound/Ulcer Post Debridement: Stable Post Procedure Diagnosis Same as Pre-procedure Electronic Signature(s) Signed: 06/11/2023 4:30:35 PM By: Duanne Guess MD FACS Signed: 06/11/2023 4:38:08 PM By: Zenaida Deed RN, BSN Entered By: Zenaida Deed on 06/11/2023 12:53:49 -------------------------------------------------------------------------------- Debridement Details Patient Name: Date of Service: [name]. 06/11/2023 2:45 PM Medical Record Number: [id number] Patient Account Number: [id number] Date of Birth/Sex: Treating RN: [birth year] [name] (89 y.o. F) Primary Care Provider: [name] Other Clinician: Referring Provider: Treating Provider/Extender: Claudie Leach Weeks in Treatment: 13 Debridement Performed for Assessment: Wound #9 Left Calcaneus Performed By: Physician Duanne Guess, MD The following information was scribed by: Zenaida Deed The information was scribed for: Duanne Guess Debridement Type: Debridement Level of Consciousness (Pre-procedure): Awake and Alert Pre-procedure Verification/Time Out Yes - 15:45 Taken: Start Time: 15:18 Pain Control: Lidocaine 4% Topical Solution Percent of Wound Bed Debrided: 100% T Area Debrided (cm): otal 0.13 Tissue and other material debrided: Non-Viable, Eschar, Slough, Subcutaneous, Slough Level: Skin/Subcutaneous Tissue Debridement Description: Excisional Instrument: Curette Bleeding: Minimum  Hemostasis Achieved: Pressure Procedural Pain: 0 Post Procedural Pain: 0 Response to Treatment: Procedure was tolerated well Level of Consciousness (Post- Awake and Alert procedure): Post Debridement Measurements of Total Wound Length: (cm) 0.4 Stage: Category/Stage III Width: (cm) 0.4 Depth: (cm) 0.4 Volume: (cm)  0.05 Character of Wound/Ulcer Post Debridement: Stable Post Procedure Diagnosis Havens, Inari [name] ([id number]) [id number] Page 5 of 17 Same as Pre-procedure Notes 11/[name]/2024: Late entry for date of service 06/11/2023. The procedure note was amended to reflect the accurate post debridement stage of the wound (stage III). Note: This amendment was made greater than 24 to 48 hours after the original date of service. Electronic Signature(s) Signed: 11/[name]/2024 2:44:51 PM By: Duanne Guess MD FACS Previous Signature: 06/11/2023 4:30:35 PM Version By: Duanne Guess MD FACS Previous Signature: 06/11/2023 4:38:08 PM Version By: Zenaida Deed RN, BSN Entered By: Duanne Guess on 11/[name]/2024 11:44:51 -------------------------------------------------------------------------------- HPI Details Patient Name: Date of Service: [name]. 06/11/2023 2:45 PM Medical Record Number: [id number] Patient Account Number: [id number] Date of Birth/Sex: Treating RN: Megan 29, [name] (89 y.o. F) Primary Care Provider: [name] Other Clinician: Referring Provider: Treating Provider/Extender: Claudie Leach Weeks in Treatment: 13 History of Present Illness HPI Description: Admission 5/[name]/2023 Ms. 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 She states she has tried Medihoney and silver alginate in the past to this area with little benefit. She also has 2 small areas limited to skin breakdown to her lower extremities bilaterally. She has chronic venous insufficiency but not has not been wearing her compression stockings. She states she bumped her legs against an object and not so the wound  started. She has been using Medihoney to the sites. She denies signs of infection. 6/2; patient presents for follow-up. She had an x-ray of her right foot done at last clinic visit and this was negative for evidence of osteomyelitis. She also had a wound culture done that showed extra high levels of Staph aureus. I recommended Keystone antibiotics for this and this was ordered. She had ABIs with TBI's done as well that showed monophasic waveforms to the right foot with TBI of 0 and an ABI of 0.52. Urgent referral was made to vein and vascular and she saw Dr. Durwin Nora on 6/1, yesterday and he recommended an arteriogram. This is scheduled for 6/16. Patient also reports a new wound to the right great toe. This is a blister that has ruptured. She also reports increased erythema to the toe. 6/6; the patient was worked in urgently today at the insistence of her daughter out of concern for a new wound on the lateral part of the plantar right great toe. She has her original postsurgical wound after the amputation of the right second toe, she has a wound on the medial part of the right great toe. The patient is apparently going for an angiogram by Dr. Durwin Nora in 2 weeks time. 6/13; patient presents for follow-up. She has been using bacitracin to the abrasion on the right great toe. She has been using collagen and Keystone antibiotic to the amputation site. She has no issues or complaints today. She denies signs of infection. 6/22; patient presents for follow-up. She states that her abdominal aortogram was canceled due to her renal  function. She has been using Keystone antibiotics to the amputation site and Medihoney to the right great toe wound. At the pace of the right great toe she has a slitlike open area that she thinks was caused by the tape from the dressing. 6/29; patient presents for follow-up. She has been using Keystone antibiotics to the amputation site along with collagen. She has been using Medihoney to  the right great toe wound. She has no other wounds. She denies signs of infection. 7/13; patient presents for follow-up. She has been using Keystone antibiotics and collagen to the wound sites. She currently denies signs of infection. She states she is scheduled to see me nephrology next month. 7/20; patient presents for follow-up. She continue Keystone antibiotics and collagen to the wound sites. She has no issues or complaints today. 8/1; patient presents for follow up. She continues to use keystone antibiotics and collagen to the wound sites. She has no issues or complaints today. 8/15; patient presents for follow-up. She has been using Keystone antibiotics and collagen to the wound sites. She followed up with her nephrologist who made medication changes. She is supposed to get a repeat BMP in 2 weeks. Her decrease in renal function was a limiting factor in obtaining an arteriogram for potential intervention for revascularization. She currently denies signs of infection. 9/2; patient presents for follow-up. She has been using Keystone antibiotic and collagen to the wound beds. She has no issues or complaints today. Reports there has been improvement in kidney function however not cleared to have her arteriogram just yet. 10/10; patient presents for follow-up. She has been using Keystone antibiotic and collagen to the wound beds. She reports 2 new wounds 1 to the anterior right lower extremity and another to the plantar aspect of the right foot. She states that the right plantar foot wound was caused by the home health nurse changing the dressing and causing a skin tear. She is not sure how the right anterior leg wound started. It appears to be from trauma. She denies signs of infection. 10/[name]; patient presents for follow-up. She has been using Keystone antibiotic and collagen to the right great toe wound. She is been using silver alginate to the right anterior and right plantar foot wound. She has  been using Tubigrip to the right lower extremity. The plantar foot wound has healed. She has no issues or complaints today. 11/3; since the patient was last here she was seen in urgent care apparently for an area on the dorsal aspect of the right fifth toe perhaps over the PIP. I saw a picture of this on the daughter's cell phone. There was slough on this. Urgent care gave him doxycycline. She is also changed the dressing to all wounds back Tsui, Sieara [name] ([id number]) [id number] Page 6 of 17 the [place] and collagen which includes her right leg and left first toe 11/16; patient presents for follow-up. She has a new wound to the left knee. She states she fell. She has been using antibiotic ointment and collagen to this area. She has been using collagen and Keystone antibiotic to the right great toe wound. The anterior right leg wound is healed. She denies signs of infection. 11/30; patient presents for follow-up. The right great toe wound has healed. She has 1 remaining wound to the left knee. She has been using Hydrofera Blue and Medihoney here. 12/[name]; patient presents for follow-up. Her left knee wound has healed. She has no issues or complaints today. 1/[name]/2024 Patient's daughter called  for an appointment due to increased swelling to her lower extremities bilaterally. Today patient presents with increased swelling to her lower extremities although there is no increased redness or warmth. She was evaluated by her nephrologist who increased her diuretics. Per patient and daughter her swelling has gone down to the leg Over the past several days. She does not have any open wounds. She was advised to elevate her legs and not consume excess salt By her PCP and nephrologist. Patient has compression stockings that she has been using sporadically. READMISSION 03/10/2023 Since her last visit to the clinic, the patient has been hospitalized at least twice, in February with COVID  pneumonia and CHF exacerbation. She was discharged to a skilled nursing facility and then readmitted in April with fevers. She was noted to have pressure ulcers on her heels and sacrum. Per family declined skilled nursing placement upon discharge and she has apparently been residing at home. She has followed with the vascular surgery clinic regarding peripheral artery disease and due to ulcerations on her feet, she ultimately underwent arteriography with balloon angioplasties of the superficial femoral artery, popliteal artery and peroneal artery on the left, along with shockwave ultrasound assisted balloon angioplasty of the popliteal artery and distal SFA. Given her multiple significant medical comorbidities, she is not a candidate for open surgery. She is deemed to be maximally vascularized this procedure, which was performed on 04 March 2023. T oday, she has a stage III pressure ulcer on her left heel and wounds on the PIP joints of her right third and left second toes. No sacral wound is present and the ulcer on her left heel has closed. 03/18/2023: She has a new ulcer on her left great toe. It was just noticed yesterday and the etiology is unknown. The fat layer is exposed and there is some slough accumulation. The other wounds are all essentially unchanged in size. There is a little bit more granulation tissue on the other toe wounds, but bone is still frankly exposed on both sides. The heel ulcer has thick slough accumulation. 03/26/2023: She has a new wound on the tip of her left third toe. It is black. Neither the patient nor her daughter are aware of how it began. The other wounds are stable. 7/[name]/2024: The culture that I took from her left third toe returned with MRSA. We contacted the patient's daughter to have her stop levofloxacin and start doxycycline, but she has not yet picked up the new antibiotic. All of her wounds have accumulated slough. Bone remains exposed. She is scheduled to  follow- up with vascular surgery tomorrow to evaluate the patency of her revascularization. 04/09/2023: She saw her vascular surgeon and her ABIs are improved; her TBI's could not be checked due to her bandages. She did finally start taking the prescribed doxycycline. All of her wounds look about the same today. 04/23/2023: No significant changes to any of her wounds aside from being slightly smaller other than the left dorsal great toe wound, which is stable. She has accumulated slough and eschar on all of the surfaces. 04/30/2023: All of the wounds are stable with the exception of the left dorsal great toe wound which now has tendon exposed in addition to bone. There is slough and eschar accumulation on all sites. 05/15/2023: The patient was in the hospital and missed her appointment last week. The wound at the tip of her left third toe is healed. Everything else looks the same. 05/21/2023: No real change to any of the  wounds. 9/[name]/2024: Once again, there has been no real change to any of her wounds except that the wound at the tip of her left great toe is closed. 06/11/2019: No change to any of her wounds. Electronic Signature(s) Signed: 06/11/2023 4:20:22 PM By: Duanne Guess MD FACS Entered By: Duanne Guess on 06/11/2023 13:20:21 -------------------------------------------------------------------------------- Physical Exam Details Patient Name: Date of Service: [name]. 06/11/2023 2:45 PM Medical Record Number: [id number] Patient Account Number: [id number] Date of Birth/Sex: Treating RN: 12-13-[name] (89 y.o. F) Primary Care Provider: [name] Other Clinician: Referring Provider: Treating Provider/Extender: Merceda Elks, Tereso Newcomer Weeks in Treatment: 13 Constitutional . . . . no acute distress. Respiratory Normal work of breathing on supplemental oxygen. MEILANY, NIPPERT ([id number]) [id number] Page 7 of 17 Notes 06/11/2019: No change to any  of her wounds. Electronic Signature(s) Signed: 06/11/2023 4:21:22 PM By: Duanne Guess MD FACS Entered By: Duanne Guess on 06/11/2023 13:21:22 -------------------------------------------------------------------------------- Physician Orders Details Patient Name: Date of Service: [name]. 06/11/2023 2:45 PM Medical Record Number: [id number] Patient Account Number: [id number] Date of Birth/Sex: Treating RN: [birth year]-[name] (89 y.o. 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 - Dr. Lady Gary - room 1 Return A Thurs 10/17 @ 2:45 pm Anesthetic (In clinic) Topical Lidocaine 4% applied to wound bed Bathing/ Shower/ Hygiene May shower and wash wound with soap and water. Edema Control - Lymphedema / SCD / Other Elevate legs to the level of the heart or above for 30 minutes daily and/or when sitting for 3-4 times a day throughout the day. Avoid standing for long periods of time. Patient to wear own compression stockings every day. Moisturize legs daily. - and feet Additional Orders /  Instructions Follow Nutritious Diet - add in protein shakes every day to diet - recommend premier protein 500 mg x3 a day vitamin C, zinc 30-50 mg per day Home Health No change in wound care orders this week; continue Home Health for wound care. May utilize formulary equivalent dressing for wound treatment orders unless otherwise specified. Other Home Health Orders/Instructions: - Medi Home Wound Treatment Wound #10 - T Second oe Wound Laterality: Left Cleanser: Soap and Water 1 x Per Day/30 Days Discharge Instructions: May shower and wash wound with dial antibacterial soap and water prior to dressing change. Cleanser: Wound Cleanser 1 x Per Day/30 Days Discharge Instructions: Cleanse the wound with wound cleanser prior to applying a clean dressing using gauze sponges, not tissue or cotton balls. Peri-Wound Care: Sween Lotion (Moisturizing lotion) 1 x Per Day/30 Days Discharge Instructions: Apply moisturizing lotion as directed CARMELLIA, MERLIN ([id number]) [phone].pdf Page 8 of 17 Prim Dressing: Promogran Prisma Matrix, 4.34 (sq in) (silver collagen) (Generic) 1 x Per Day/30 Days ary Discharge Instructions: Moisten collagen with saline or hydrogel  Secondary Dressing: Woven Gauze Sponge, Non-Sterile 4x4 in (Generic) 1 x Per Day/30 Days Discharge Instructions: Apply over primary dressing as directed. Secured With: [employer], 4.5x3.1 (in/yd) 1 x Per Day/30 Days Discharge Instructions: Secure with Kerlix as directed. Secured With: 44M Medipore H Soft Cloth Surgical T ape, 4 x 10 (in/yd) (Generic) 1 x Per Day/30 Days Discharge Instructions: Secure with tape as directed. Wound #11 - T Second oe Wound Laterality: Right Cleanser: Soap and Water 1 x Per Day/30 Days Discharge Instructions: May shower and wash wound with dial antibacterial soap and water prior to dressing change. Cleanser: Wound Cleanser 1 x Per Day/30 Days Discharge Instructions: Cleanse the  wound with wound cleanser prior to applying a clean dressing using gauze sponges, not tissue or cotton balls. Prim Dressing: Promogran Prisma Matrix, 4.34 (sq in) (silver collagen) (Generic) 1 x Per Day/30 Days ary Discharge Instructions: Moisten collagen with saline or hydrogel Secondary Dressing: Woven Gauze Sponge, Non-Sterile 4x4 in (Generic) 1 x Per Day/30 Days Discharge Instructions: Apply over primary dressing as directed. Secured With: Insurance underwriter, Sterile 2x75 (in/in) 1 x Per Day/30 Days Discharge Instructions: Secure with stretch gauze as directed. Secured With: 44M Medipore H Soft Cloth Surgical T ape, 4 x 10 (in/yd) (Generic) 1 x Per Day/30 Days Discharge Instructions: Secure with tape as directed. Wound #13 - T Great oe Wound Laterality: Dorsal, Left Cleanser: Soap and Water 1 x Per Day/30 Days Discharge Instructions: May shower and wash wound with dial antibacterial soap and water prior to dressing change. Cleanser: Wound Cleanser 1 x Per Day/30 Days Discharge Instructions: Cleanse the wound with wound cleanser prior to applying a clean dressing using gauze sponges, not tissue or cotton balls. Prim Dressing: Promogran Prisma Matrix, 4.34 (sq in) (silver collagen) (Generic) 1 x Per Day/30 Days ary Discharge Instructions: Moisten collagen with saline or hydrogel Secondary Dressing: Woven Gauze Sponge, Non-Sterile 4x4 in (Generic) 1 x Per Day/30 Days Discharge Instructions: Apply over primary dressing as directed. Secured With: 44M Medipore H Soft Cloth Surgical T ape, 4 x 10 (in/yd) (Generic) 1 x Per Day/30 Days Discharge Instructions: Secure with tape as directed. Wound #15 - T Great oe Wound Laterality: Dorsal, Right Cleanser: Soap and Water 1 x Per Day/30 Days Discharge Instructions: May shower and wash wound with dial antibacterial soap and water prior to dressing change. Cleanser: Wound Cleanser 1 x Per Day/30 Days Discharge Instructions: Cleanse the  wound with wound cleanser prior to applying a clean dressing using gauze sponges, not tissue or cotton balls. Prim Dressing: Promogran Prisma Matrix, 4.34 (sq in) (silver collagen) (Generic) 1 x Per Day/30 Days ary Discharge Instructions: Moisten collagen with saline or hydrogel Secondary Dressing: Woven Gauze Sponge, Non-Sterile 4x4 in (Generic) 1 x Per Day/30 Days Discharge Instructions: Apply over primary dressing as directed. Secured With: Insurance underwriter, Sterile 2x75 (in/in) 1 x Per Day/30 Days Discharge Instructions: Secure with stretch gauze as directed. Secured With: 44M Medipore H Soft Cloth Surgical T ape, 4 x 10 (in/yd) (Generic) 1 x Per Day/30 Days Discharge Instructions: Secure with tape as directed. Wound #9 - Calcaneus Wound Laterality: Left Cleanser: Soap and Water 1 x Per Day/30 Days Discharge Instructions: May shower and wash wound with dial antibacterial soap and water prior to dressing change. Cleanser: Wound Cleanser 1 x Per Day/30 Days Discharge Instructions: Cleanse the wound with wound cleanser prior to applying a clean dressing using gauze sponges, not tissue or cotton balls. Prim Dressing: Promogran Prisma  Matrix, 4.34 (sq in) (silver collagen) (Generic) 1 x Per Day/30 Days ary CHANNEL, TEE ([id number]) [id number] Page 9 of 17 Discharge Instructions: Moisten collagen with saline or hydrogel Secondary Dressing: ALLEVYN Heel 4 1/2in x 5 1/2in / 10.5cm x 13.5cm (Generic) 1 x Per Day/30 Days Discharge Instructions: Apply over primary dressing as directed. Secondary Dressing: Woven Gauze Sponge, Non-Sterile 4x4 in (Generic) 1 x Per Day/30 Days Discharge Instructions: Apply over primary dressing as directed. Secured With: [employer], 4.5x3.1 (in/yd) 1 x Per Day/30 Days Discharge Instructions: Secure with Kerlix as directed. Secured With: 63M Medipore H Soft Cloth Surgical T ape, 4 x 10 (in/yd) (Generic) 1 x Per Day/30  Days Discharge Instructions: Secure with tape as directed. Electronic Signature(s) Signed: 06/11/2023 4:30:35 PM By: Duanne Guess MD FACS Entered By: Duanne Guess on 06/11/2023 13:[name]:01 -------------------------------------------------------------------------------- Problem List Details Patient Name: Date of Service: [name]. 06/11/2023 2:45 PM Medical Record Number: [id number] Patient Account Number: [id number] Date of Birth/Sex: Treating RN: [birth year] [name] (89 y.o. 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 03/10/2023 No Yes without evidence of necrosis 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 03/10/2023 No Yes without evidence of necrosis L97.522 Non-pressure chronic ulcer of other part of left foot with fat layer exposed 03/18/2023 No Yes I73.9 Peripheral vascular disease, unspecified 03/10/2023 No Yes I50.32 Chronic diastolic (congestive) heart failure 03/10/2023 No Yes N18.32 Chronic kidney disease, stage 3b 03/10/2023 No Yes I87.2 Venous insufficiency (chronic) (peripheral) 03/10/2023 No Yes Caba, Catrina [name] ([id number]) [id number] Page 10 of 17 Inactive Problems Resolved Problems Electronic Signature(s) Signed: 06/11/2023 4:16:28 PM By: Duanne Guess MD FACS Entered By: Duanne Guess on 06/11/2023 13:16:27 -------------------------------------------------------------------------------- Progress Note Details Patient Name: Date of Service: [name]. 06/11/2023 2:45 PM Medical Record Number: [id number] Patient Account Number: [id number] Date of  Birth/Sex: Treating RN: [name] (89 y.o. F) Primary Care Provider: [name] Other Clinician: Referring Provider: Treating Provider/Extender: Tiajuana Amass in Treatment: 13 Subjective Chief Complaint Information obtained from Patient 5/[name]/2023; right second toe amputation site dehiscence and bilateral lower extremity wounds. 03/10/2023: pressure ulcers of right heel, ulcers on toes History of Present Illness (HPI) Admission 5/[name]/2023 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 She states she has tried Medihoney and silver alginate in the past to this area with little benefit. She also has 2 small areas limited to skin breakdown to her lower extremities bilaterally. She has chronic venous insufficiency but not has not been wearing her compression stockings. She states she bumped her legs against an object and not so the wound started. She has been using Medihoney to the sites. She denies signs of infection. 6/2; patient presents for follow-up. She had an x-ray of her right foot done at last clinic visit and this was negative for evidence of osteomyelitis. She also had a wound culture done that showed extra high levels of Staph aureus. I  recommended Keystone antibiotics for this and this was ordered. She had ABIs with TBI's done as well that showed monophasic waveforms to the right foot with TBI of 0 and an ABI of 0.52. Urgent referral was made to vein and vascular and she saw Dr. Durwin Nora on 6/1, yesterday and he recommended an arteriogram. This is scheduled for 6/16. Patient also reports a new wound to the right great toe. This is a blister that has ruptured. She also reports increased  erythema to the toe. 6/6; the patient was worked in urgently today at the insistence of her daughter out of concern for a new wound on the lateral part of the plantar right great toe. She has her original postsurgical wound after the amputation of the right second toe, she has a wound on the medial part of the right great toe. The patient is apparently going for an angiogram by Dr. Durwin Nora in 2 weeks time. 6/13; patient presents for follow-up. She has been using bacitracin to the abrasion on the right great toe. She has been using collagen and Keystone antibiotic to the amputation site. She has no issues or complaints today. She denies signs of infection. 6/22; patient presents for follow-up. She states that her abdominal aortogram was canceled due to her renal function. She has been using Keystone antibiotics to the amputation site and Medihoney to the right great toe wound. At the pace of the right great toe she has a slitlike open area that she thinks was caused by the tape from the dressing. 6/29; patient presents for follow-up. She has been using Keystone antibiotics to the amputation site along with collagen. She has been using Medihoney to the right great toe wound. She has no other wounds. She denies signs of infection. 7/13; patient presents for follow-up. She has been using Keystone antibiotics and collagen to the wound sites. She currently denies signs of infection. She states she is scheduled to see me nephrology next month. 7/20; patient presents for follow-up. She continue Keystone antibiotics and collagen to the wound sites. She has no issues or complaints today. 8/1; patient presents for follow up. She continues to use keystone antibiotics and collagen to the wound sites. She has no issues or complaints today. 8/15; patient presents for follow-up. She has been using Keystone antibiotics and collagen to the wound sites. She followed up with her nephrologist who made medication changes.  She is supposed to get a repeat BMP in 2 weeks. Her decrease in renal function was a limiting factor in obtaining an arteriogram for potential intervention for revascularization. She currently denies signs of infection. 9/2; patient presents for follow-up. She has been using Keystone antibiotic and collagen to the wound beds. She has no issues or complaints today. Reports there has been improvement in kidney function however not cleared to have her arteriogram just yet. 10/10; patient presents for follow-up. She has been using Keystone antibiotic and collagen to the wound beds. She reports 2 new wounds 1 to the anterior right lower extremity and another to the plantar aspect of the right foot. She states that the right plantar foot wound was caused by the home health nurse changing the dressing and causing a skin tear. She is not sure how the right anterior leg wound started. It appears to be from trauma. She denies signs of infection. 10/[name]; patient presents for follow-up. She has been using Keystone antibiotic and collagen to the right great toe wound. She is been using silver alginate to the right anterior  and right plantar foot wound. She has been using Tubigrip to the right lower extremity. The plantar foot wound has healed. She has no issues or complaints today. [name], [name] ([id number]) [id number] Page 62 of 17 11/3; since the patient was last here she was seen in urgent care apparently for an area on the dorsal aspect of the right fifth toe perhaps over the PIP. I saw a picture of this on the daughter's cell phone. There was slough on this. Urgent care gave him doxycycline. She is also changed the dressing to all wounds back the [facility] and collagen which includes her right leg and left first toe 11/16; patient presents for follow-up. She has a new wound to the left knee. She states she fell. She has been using antibiotic ointment and collagen to this area. She  has been using collagen and Keystone antibiotic to the right great toe wound. The anterior right leg wound is healed. She denies signs of infection. 11/30; patient presents for follow-up. The right great toe wound has healed. She has 1 remaining wound to the left knee. She has been using Hydrofera Blue and Medihoney here. 12/[name]; patient presents for follow-up. Her left knee wound has healed. She has no issues or complaints today. 1/[name]/2024 Patient's daughter called for an appointment due to increased swelling to her lower extremities bilaterally. Today patient presents with increased swelling to her lower extremities although there is no increased redness or warmth. She was evaluated by her nephrologist who increased her diuretics. Per patient and daughter her swelling has gone down to the leg Over the past several days. She does not have any open wounds. She was advised to elevate her legs and not consume excess salt By her PCP and nephrologist. Patient has compression stockings that she has been using sporadically. READMISSION 03/10/2023 Since her last visit to the clinic, the patient has been hospitalized at least twice, in February with COVID pneumonia and CHF exacerbation. She was discharged to a skilled nursing facility and then readmitted in April with fevers. She was noted to have pressure ulcers on her heels and sacrum. Per family declined skilled nursing placement upon discharge and she has apparently been residing at home. She has followed with the vascular surgery clinic regarding peripheral artery disease and due to ulcerations on her feet, she ultimately underwent arteriography with balloon angioplasties of the superficial femoral artery, popliteal artery and peroneal artery on the left, along with shockwave ultrasound assisted balloon angioplasty of the popliteal artery and distal SFA. Given her multiple significant medical comorbidities, she is not a candidate for open surgery. She is  deemed to be maximally vascularized this procedure, which was performed on 04 March 2023. T oday, she has a stage III pressure ulcer on her left heel and wounds on the PIP joints of her right third and left second toes. No sacral wound is present and the ulcer on her left heel has closed. 03/18/2023: She has a new ulcer on her left great toe. It was just noticed yesterday and the etiology is unknown. The fat layer is exposed and there is some slough accumulation. The other wounds are all essentially unchanged in size. There is a little bit more granulation tissue on the other toe wounds, but bone is still frankly exposed on both sides. The heel ulcer has thick slough accumulation. 03/26/2023: She has a new wound on the tip of her left third toe. It is black. Neither the patient nor her daughter are aware of how  it began. The other wounds are stable. 7/[name]/2024: The culture that I took from her left third toe returned with MRSA. We contacted the patient's daughter to have her stop levofloxacin and start doxycycline, but she has not yet picked up the new antibiotic. All of her wounds have accumulated slough. Bone remains exposed. She is scheduled to follow- up with vascular surgery tomorrow to evaluate the patency of her revascularization. 04/09/2023: She saw her vascular surgeon and her ABIs are improved; her TBI's could not be checked due to her bandages. She did finally start taking the prescribed doxycycline. All of her wounds look about the same today. 04/23/2023: No significant changes to any of her wounds aside from being slightly smaller other than the left dorsal great toe wound, which is stable. She has accumulated slough and eschar on all of the surfaces. 04/30/2023: All of the wounds are stable with the exception of the left dorsal great toe wound which now has tendon exposed in addition to bone. There is slough and eschar accumulation on all sites. 05/15/2023: The patient was in the hospital and  missed her appointment last week. The wound at the tip of her left third toe is healed. Everything else looks the same. 05/21/2023: No real change to any of the wounds. 9/[name]/2024: Once again, there has been no real change to any of her wounds except that the wound at the tip of her left great toe is closed. 06/11/2019: No change to any of her wounds. Patient History Information obtained from Patient, Caregiver, Chart. Family History Cancer - Siblings, Heart Disease, Stroke - Mother, No family history of Diabetes. Social History Former smoker - quit 2003, Marital Status - Widowed, Alcohol Use - Never, Drug Use - No History, Caffeine Use - Never. Medical History Eyes Patient has history of Cataracts Hematologic/Lymphatic Patient has history of Anemia Respiratory Denies history of Chronic Obstructive Pulmonary Disease (COPD) Cardiovascular Patient has history of Arrhythmia - a-fib, Congestive Heart Failure, Hypotension, Peripheral Venous Disease Musculoskeletal Patient has history of 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 Neurologic Patient has history of Neuropathy Hospitalization/Surgery History - Abdominal aortogram w/lower extremity. - Amputation toe (Right). - Colonoscopy. - Elbow surgery. - Laparoscopic hysterectomy. Medical A Surgical History Notes nd Hematologic/Lymphatic Thrombocytopenia, Anticoagulant long-term use Respiratory Pulmonary embolus Gastrointestinal Leyba, [name] ([id number]) [id number] Page 12 of 17 Hiatal hernia Endocrine Hyperthyroidism, Hypothyroidism Genitourinary CKD stage III Musculoskeletal arthritis Objective Constitutional no acute distress. Vitals Time Taken: 3:00 AM, Height: 64 in, Weight: 157 lbs, BMI: 26.9, Temperature: 98.0 F, Pulse: 79 bpm, Respiratory Rate: 18 breaths/min, Blood Pressure: 126/62 mmHg. Respiratory Normal work of breathing on supplemental oxygen. General Notes: 06/11/2019: No change  to any of her wounds. Integumentary (Hair, Skin) Wound #10 status is Open. Original cause of wound was Pressure Injury. The date acquired was: 10/09/2022. The wound has been in treatment 13 weeks. The wound is located on the Left T Second. The wound measures 0.3cm length x 0.5cm width x 0.1cm depth; 0.118cm^2 area and 0.012cm^3 volume. There is oe bone, joint, and Fat Layer (Subcutaneous Tissue) exposed. There is no tunneling or undermining noted. There is a small amount of serous drainage noted. The wound margin is distinct with the outline attached to the wound base. There is medium (34-66%) red granulation within the wound bed. There is a small (1-33%) amount of necrotic tissue within the wound bed including Adherent Slough. The periwound skin appearance had no abnormalities noted for texture. The periwound skin appearance had no abnormalities  noted for moisture. The periwound skin appearance had no abnormalities noted for color. Periwound temperature was noted as No Abnormality. The periwound has tenderness on palpation. Wound #11 status is Open. Original cause of wound was Pressure Injury. The date acquired was: 10/09/2022. The wound has been in treatment 13 weeks. The wound is located on the Right T Second. The wound measures 0.5cm length x 0.6cm width x 0.1cm depth; 0.236cm^2 area and 0.024cm^3 volume. There is oe bone, joint, and Fat Layer (Subcutaneous Tissue) exposed. There is no tunneling or undermining noted. There is a medium amount of serosanguineous drainage noted. The wound margin is distinct with the outline attached to the wound base. There is no granulation within the wound bed. There is a medium (34-66%) amount of necrotic tissue within the wound bed including Adherent Slough. The periwound skin appearance had no abnormalities noted for texture. The periwound skin appearance had no abnormalities noted for moisture. The periwound skin appearance had no abnormalities noted for color.  Periwound temperature was noted as No Abnormality. The periwound has tenderness on palpation. Wound #12 status is Open. Original cause of wound was Gradually Appeared. The date acquired was: 03/17/2023. The wound has been in treatment 12 weeks. The wound is located on the Left T Great. The wound measures 0cm length x 0cm width x 0cm depth; 0cm^2 area and 0cm^3 volume. There is no tunneling or oe undermining noted. There is a none present amount of drainage noted. The wound margin is flat and intact. There is no granulation within the wound bed. There is no necrotic tissue within the wound bed. The periwound skin appearance had no abnormalities noted for moisture. The periwound skin appearance had no abnormalities noted for color. The periwound skin appearance exhibited: Callus. Periwound temperature was noted as No Abnormality. The periwound has tenderness on palpation. Wound #13 status is Open. Original cause of wound was Gradually Appeared. The date acquired was: 03/24/2023. The wound has been in treatment 11 weeks. The wound is located on the Left,Dorsal T Great. The wound measures 1cm length x 1.4cm width x 0.4cm depth; 1.1cm^2 area and 0.44cm^3 volume. There oe is bone, tendon, and Fat Layer (Subcutaneous Tissue) exposed. There is no tunneling or undermining noted. There is a medium amount of serosanguineous drainage noted. The wound margin is distinct with the outline attached to the wound base. There is small (1-33%) red granulation within the wound bed. There is a medium (34-66%) amount of necrotic tissue within the wound bed including Adherent Slough. The periwound skin appearance had no abnormalities noted for texture. The periwound skin appearance had no abnormalities noted for moisture. The periwound skin appearance had no abnormalities noted for color. Periwound temperature was noted as No Abnormality. The periwound has tenderness on palpation. Wound #15 status is Open. Original cause of  wound was Gradually Appeared. The date acquired was: 03/26/2023. The wound has been in treatment 11 weeks. The wound is located on the Right,Dorsal T [place]. The wound measures 0.7cm length x 0.5cm width x 0.1cm depth; 0.275cm^2 area and 0.027cm^3 volume. oe There is Fat Layer (Subcutaneous Tissue) exposed. There is no tunneling or undermining noted. There is a medium amount of serosanguineous drainage noted. The wound margin is flat and intact. There is large (67-100%) red granulation within the wound bed. There is a small (1-33%) amount of necrotic tissue within the wound bed including Eschar and Adherent Slough. The periwound skin appearance had no abnormalities noted for texture. The periwound skin appearance had no abnormalities  noted for moisture. The periwound skin appearance had no abnormalities noted for color. Periwound temperature was noted as No Abnormality. The periwound has tenderness on palpation. Wound #9 status is Open. Original cause of wound was Pressure Injury. The date acquired was: 10/22/2022. The wound has been in treatment 13 weeks. The wound is located on the Left Calcaneus. The wound measures 0.4cm length x 0.4cm width x 0.4cm depth; 0.126cm^2 area and 0.05cm^3 volume. There is Fat Layer (Subcutaneous Tissue) exposed. There is no tunneling or undermining noted. There is a small amount of serous drainage noted. The wound margin is distinct with the outline attached to the wound base. There is no granulation within the wound bed. There is a large (67-100%) amount of necrotic tissue within the wound bed including Eschar and Adherent Slough. The periwound skin appearance had no abnormalities noted for moisture. The periwound skin appearance had no abnormalities noted for color. The periwound skin appearance exhibited: Callus. Periwound temperature was noted as No Abnormality. The periwound has tenderness on palpation. 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 There was a Selective/Open Wound Non-Viable Tissue oe Debridement with a total area of 0.12 sq cm performed by Duanne Guess, MD. With the following instrument(s): Curette to remove Non-Viable tissue/material. Material removed includes [facility] after achieving pain control using Lidocaine 4% Topical Solution. No specimens were taken. A time out was conducted at 15:45, prior to the start of the procedure. There was no bleeding. The procedure was tolerated well with a pain level of 0 throughout and a pain level of 0 following the procedure. Post Debridement Measurements: 0.3cm length x 0.5cm width x 0.1cm depth; 0.012cm^3 volume. Post debridement Stage noted as Category/Stage IV. Character of Wound/Ulcer Post Debridement is stable. Post procedure Diagnosis Wound #10: Same as Pre-Procedure Wound #11 Pre-procedure diagnosis of Wound #11 is a Pressure Ulcer located on the Right T Second . There was a Selective/Open Wound Non-Viable Tissue oe Debridement with a total area of 0.24 sq cm performed by Duanne Guess, MD. With the following instrument(s): Curette to remove Non-Viable tissue/material. Material removed includes [facility] after achieving pain control using Lidocaine 4% Topical Solution. No specimens were taken. A time out was conducted at 15:45, prior  to the start of the procedure. There was no bleeding. The procedure was tolerated well with a pain level of 0 throughout and a pain level of 0 following the procedure. Post Debridement Measurements: 0.5cm length x 0.6cm width x 0.1cm depth; 0.024cm^3 volume. Post debridement Stage noted as Category/Stage IV. Character of Wound/Ulcer Post Debridement is stable. 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 There was a Selective/Open Wound Non-Viable Tissue Debridement with a total area of 1.1 sq cm performed by Duanne Guess, MD. With the following instrument(s): Curette to remove Non-Viable tissue/material.  Material removed includes [place] after achieving pain control using Lidocaine 4% Topical Solution. No specimens were taken. A time out was conducted at 15:45, prior to the start of the procedure. There was no bleeding. The procedure was tolerated well with a pain level of 0 throughout and a pain level of 0 following the procedure. Post Debridement Measurements: 1cm length x 1.4cm width x 0.1cm depth; 0.11cm^3 volume. Character of Wound/Ulcer Post Debridement is stable. Severity of Tissue Post Debridement is: Necrosis of bone. Post procedure Diagnosis Wound #13: Same as Pre-Procedure Wound #15 Pre-procedure diagnosis of Wound #15 is an Arterial Insufficiency Ulcer located on the Right,Dorsal T Great .Severity of Tissue Pre Debridement is: oe Necrosis of bone. There was a Selective/Open Wound Non-Viable Tissue Debridement with a total area of 0.27 sq cm performed by Duanne Guess, MD. With the following instrument(s): Curette to remove Non-Viable tissue/material. Material removed includes [facility] after achieving pain control using Lidocaine 4% T opical Solution. No specimens were taken. A time out was  conducted at 15:45, prior to the start of the procedure. There was no bleeding. The procedure was tolerated well with a pain level of 0 throughout and a pain level of 0 following the procedure. Post Debridement Measurements: 0.7cm length x 0.5cm width x 0.1cm depth; 0.027cm^3 volume. Character of Wound/Ulcer Post Debridement is stable. Severity of Tissue Post Debridement is: Necrosis of bone. Post procedure Diagnosis Wound #15: Same as Pre-Procedure Wound #9 Pre-procedure diagnosis of Wound #9 is a Pressure Ulcer located on the Left Calcaneus . There was a Excisional Skin/Subcutaneous Tissue Debridement with a total area of 0.13 sq cm performed by Duanne Guess, MD. With the following instrument(s): Curette to remove Non-Viable tissue/material. Material removed includes Eschar, Subcutaneous Tissue, and Slough after achieving pain control using Lidocaine 4% T opical Solution. No specimens were taken. A time out was conducted at 15:45, prior to the start of the procedure. A Minimum amount of bleeding was controlled with Pressure. The procedure was tolerated well with a pain level of 0 throughout and a pain level of 0 following the procedure. Post Debridement Measurements: 0.4cm length x 0.4cm width x 0.4cm depth; 0.05cm^3 volume. Post debridement Stage noted as Category/Stage III. Character of Wound/Ulcer Post Debridement is stable. Post procedure Diagnosis Wound #9: Same as Pre-Procedure General Notes: 11/[name]/2024: Late entry for date of service 06/11/2023. The procedure note was amended to reflect the accurate post debridement stage of the wound (stage III). Note: This amendment was made greater than 24 to 48 hours after the original date of service.. Plan Follow-up Appointments: Return Appointment in 2 weeks. - Dr. Lady Gary - room 1 Thurs 10/17 @ 2:45 pm Anesthetic: (In clinic) Topical Lidocaine 4% applied to wound bed Bathing/ Shower/ Hygiene: May shower and wash wound with soap and  water. Edema Control - Lymphedema / SCD / Other: Elevate legs to the level of the heart or above for 30 minutes daily and/or when sitting for 3-4 times a day throughout the day. Avoid standing for long periods of time. Patient to wear own compression stockings every day. Moisturize legs daily. - and feet Additional Orders / Instructions: Follow Nutritious Diet - add in protein shakes every day to diet - recommend premier protein 500 mg x3 a day vitamin C, zinc 30-50 mg per day [name], [name] ([id number]) [phone].pdf Page 14 of 17 Home Health: No change in wound care orders this week; continue Home Health for wound care. May utilize formulary equivalent dressing for wound treatment orders unless  otherwise specified. Other Home Health Orders/Instructions: - Medi Home WOUND #10: - T Second Wound Laterality: Left oe Cleanser: Soap and Water 1 x Per Day/30 Days Discharge Instructions: May shower and wash wound with dial antibacterial soap and water prior to dressing change. Cleanser: Wound Cleanser 1 x Per Day/30 Days Discharge Instructions: Cleanse the wound with wound cleanser prior to applying a clean dressing using gauze sponges, not tissue or cotton balls. Peri-Wound Care: Sween Lotion (Moisturizing lotion) 1 x Per Day/30 Days Discharge Instructions: Apply moisturizing lotion as directed Prim Dressing: Promogran Prisma Matrix, 4.34 (sq in) (silver collagen) (Generic) 1 x Per Day/30 Days ary Discharge Instructions: Moisten collagen with saline or hydrogel Secondary Dressing: Woven Gauze Sponge, Non-Sterile 4x4 in (Generic) 1 x Per Day/30 Days Discharge Instructions: Apply over primary dressing as directed. Secured With: [employer], 4.5x3.1 (in/yd) 1 x Per Day/30 Days Discharge Instructions: Secure with Kerlix as directed. Secured With: 57M Medipore H Soft Cloth Surgical T ape, 4 x 10 (in/yd) (Generic) 1 x Per Day/30 Days Discharge Instructions: Secure  with tape as directed. WOUND #11: - T Second Wound Laterality: Right oe Cleanser: Soap and Water 1 x Per Day/30 Days Discharge Instructions: May shower and wash wound with dial antibacterial soap and water prior to dressing change. Cleanser: Wound Cleanser 1 x Per Day/30 Days Discharge Instructions: Cleanse the wound with wound cleanser prior to applying a clean dressing using gauze sponges, not tissue or cotton balls. Prim Dressing: Promogran Prisma Matrix, 4.34 (sq in) (silver collagen) (Generic) 1 x Per Day/30 Days ary Discharge Instructions: Moisten collagen with saline or hydrogel Secondary Dressing: Woven Gauze Sponge, Non-Sterile 4x4 in (Generic) 1 x Per Day/30 Days Discharge Instructions: Apply over primary dressing as directed. Secured With: Insurance underwriter, Sterile 2x75 (in/in) 1 x Per Day/30 Days Discharge Instructions: Secure with stretch gauze as directed. Secured With: 57M Medipore H Soft Cloth Surgical T ape, 4 x 10 (in/yd) (Generic) 1 x Per Day/30 Days Discharge Instructions: Secure with tape as directed. WOUND #13: - T Great Wound Laterality: Dorsal, Left oe Cleanser: Soap and Water 1 x Per Day/30 Days Discharge Instructions: May shower and wash wound with dial antibacterial soap and water prior to dressing change. Cleanser: Wound Cleanser 1 x Per Day/30 Days Discharge Instructions: Cleanse the wound with wound cleanser prior to applying a clean dressing using gauze sponges, not tissue or cotton balls. Prim Dressing: Promogran Prisma Matrix, 4.34 (sq in) (silver collagen) (Generic) 1 x Per Day/30 Days ary Discharge Instructions: Moisten collagen with saline or hydrogel Secondary Dressing: Woven Gauze Sponge, Non-Sterile 4x4 in (Generic) 1 x Per Day/30 Days Discharge Instructions: Apply over primary dressing as directed. Secured With: 57M Medipore H Soft Cloth Surgical T ape, 4 x 10 (in/yd) (Generic) 1 x Per Day/30 Days Discharge Instructions: Secure with  tape as directed. WOUND #15: - T Great Wound Laterality: Dorsal, Right oe Cleanser: Soap and Water 1 x Per Day/30 Days Discharge Instructions: May shower and wash wound with dial antibacterial soap and water prior to dressing change. Cleanser: Wound Cleanser 1 x Per Day/30 Days Discharge Instructions: Cleanse the wound with wound cleanser prior to applying a clean dressing using gauze sponges, not tissue or cotton balls. Prim Dressing: Promogran Prisma Matrix, 4.34 (sq in) (silver collagen) (Generic) 1 x Per Day/30 Days ary Discharge Instructions: Moisten collagen with saline or hydrogel Secondary Dressing: Woven Gauze Sponge, Non-Sterile 4x4 in (Generic) 1 x Per Day/30 Days Discharge Instructions: Apply over primary dressing as  directed. Secured With: Insurance underwriter, Sterile 2x75 (in/in) 1 x Per Day/30 Days Discharge Instructions: Secure with stretch gauze as directed. Secured With: 61M Medipore H Soft Cloth Surgical T ape, 4 x 10 (in/yd) (Generic) 1 x Per Day/30 Days Discharge Instructions: Secure with tape as directed. WOUND #9: - Calcaneus Wound Laterality: Left Cleanser: Soap and Water 1 x Per Day/30 Days Discharge Instructions: May shower and wash wound with dial antibacterial soap and water prior to dressing change. Cleanser: Wound Cleanser 1 x Per Day/30 Days Discharge Instructions: Cleanse the wound with wound cleanser prior to applying a clean dressing using gauze sponges, not tissue or cotton balls. Prim Dressing: Promogran Prisma Matrix, 4.34 (sq in) (silver collagen) (Generic) 1 x Per Day/30 Days ary Discharge Instructions: Moisten collagen with saline or hydrogel Secondary Dressing: ALLEVYN Heel 4 1/2in x 5 1/2in / 10.5cm x 13.5cm (Generic) 1 x Per Day/30 Days Discharge Instructions: Apply over primary dressing as directed. Secondary Dressing: Woven Gauze Sponge, Non-Sterile 4x4 in (Generic) 1 x Per Day/30 Days Discharge Instructions: Apply over primary  dressing as directed. Secured With: [employer], 4.5x3.1 (in/yd) 1 x Per Day/30 Days Discharge Instructions: Secure with Kerlix as directed. Secured With: 61M Medipore H Soft Cloth Surgical T ape, 4 x 10 (in/yd) (Generic) 1 x Per Day/30 Days Discharge Instructions: Secure with tape as directed. 06/11/2019: No change to any of her wounds. I used a curette to debride slough off of all the wounds on her toes and slough and subcutaneous tissue from her heel. Will continue to use Prisma silver collagen on all of her wounds. I think the likelihood of any of these healing is extremely small. We are dealing with poor arterial inflow and a 89 year old patient who is in poor health. Nonetheless, her daughter wishes for [place] to continue active treatment. They will follow-up in 2 weeks. Electronic Signature(s) Signed: 11/[name]/2024 4:13:51 PM By: Duanne Guess MD FACS Previous Signature: 11/[name]/2024 2:45:15 PM Version By: Duanne Guess MD FACS Previous Signature: 06/11/2023 4:25:32 PM Version By: Duanne Guess MD FACS Entered By: Zenaida Deed on 11/[name]/2024 13:12:34 Vercher, [name] ([id number]) [id number] Page 15 of 17 -------------------------------------------------------------------------------- HxROS Details Patient Name: Date of Service: [name], [name] 06/11/2023 2:45 PM Medical Record Number: [id number] Patient Account Number: [id number] Date of Birth/Sex: Treating RN: [name] (89 y.o. F) Primary Care Provider: [name] Other Clinician: Referring Provider: Treating Provider/Extender: Tiajuana Amass in Treatment: 13 Information Obtained From Patient Caregiver Chart Eyes Medical History: Positive for: Cataracts Hematologic/Lymphatic Medical History: Positive for: Anemia Past Medical History Notes: Thrombocytopenia, Anticoagulant long-term use Respiratory Medical History: Negative for: Chronic Obstructive Pulmonary Disease  (COPD) Past Medical History Notes: Pulmonary embolus Cardiovascular Medical History: Positive for: Arrhythmia - a-fib; Congestive Heart Failure; Hypotension; Peripheral Venous Disease Gastrointestinal Medical History: Past Medical History Notes: Hiatal hernia Endocrine Medical History: Past Medical History Notes: Hyperthyroidism, Hypothyroidism Genitourinary Medical History: Past Medical History Notes: CKD stage III Musculoskeletal Medical History: Positive for: 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 Past Medical History Notes: arthritis Neurologic Medical History: Positive for: Neuropathy HBO Extended History Items Eyes: Cataracts Saccente, Xiamara [name] ([id number]) [id number] Page 16 of 17 Immunizations Pneumococcal Vaccine: Received Pneumococcal Vaccination: No Implantable Devices None Hospitalization / Surgery History Type of Hospitalization/Surgery Abdominal aortogram w/lower extremity Amputation toe (Right) Colonoscopy Elbow surgery Laparoscopic hysterectomy Family and Social History Cancer: Yes - Siblings; Diabetes: No; Heart Disease: Yes; Stroke: Yes - Mother; Former smoker - quit 2003; Marital Status - Widowed;  Alcohol Use: Never; Drug Use: No History; Caffeine Use: Never; Financial Concerns: No; Food, Clothing or Shelter Needs: No; Support System Lacking: No; Transportation Concerns: No Electronic Signature(s) Signed: 06/11/2023 4:30:35 PM By: Duanne Guess MD FACS Entered By: Duanne Guess on 06/11/2023 13:21:01 -------------------------------------------------------------------------------- SuperBill Details Patient Name: Date of Service: [name]. 06/11/2023 Medical Record Number: [id number] Patient Account Number: [id number] Date of Birth/Sex: Treating RN: [birth year], [name] (89 y.o. 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 without evidence of necrosis L97.522 Non-pressure chronic ulcer of other part of left foot with fat layer exposed I73.9 Peripheral vascular disease, unspecified I50.32 Chronic diastolic (congestive) heart failure N18.32 Chronic kidney disease, stage 3b I87.2 Venous insufficiency (chronic) (peripheral) Facility Procedures : CPT4 Code: [id number] Description: 11042 - DEB SUBQ TISSUE 20 SQ CM/< ICD-10 Diagnosis Description L89.623 Pressure ulcer of left heel, stage 3 Modifier: Quantity: 1 : CPT4 Code: [id number] Description: 97597 - DEBRIDE WOUND 1ST 20 SQ CM OR < ICD-10 Diagnosis Description 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 with L97.526 Non-pressure chronic ulcer of other part of left foot with bone  involvement witho L97.522 Non-pressure chronic ulcer of other part of left foot with fat layer exposed Modifier: out evidence of nec ut evidence of necr Quantity: 1 rosis osis Physician Procedures : CPT4 Code Description Modifier MAYKAYLA, RAWN ([id number]) [id number] 99213 - WC PHYS LEVEL 3 - EST PT 1 ICD-10 Diagnosis Description L89.623 Pressure ulcer of left heel, stage 3 L97.516 Non-pressure chronic ulcer of  other part of right foot with bone involvement without evidence of necrosis 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 without evidence of necrosis I73.9 Peripheral vascular disease, unspecified Quantity: Page 17 of 17 : [id number] 11042 - WC PHYS SUBQ TISS 20 SQ CM 1 ICD-10 Diagnosis Description L89.623 Pressure ulcer of left heel, stage  3 Quantity: : [id number] 97597 - WC PHYS DEBR WO ANESTH 20 SQ CM 1 ICD-10 Diagnosis Description 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

## 2023-06-18 ENCOUNTER — Emergency Department (HOSPITAL_BASED_OUTPATIENT_CLINIC_OR_DEPARTMENT_OTHER): Payer: Medicare HMO

## 2023-06-18 ENCOUNTER — Other Ambulatory Visit: Payer: Self-pay

## 2023-06-18 ENCOUNTER — Emergency Department (HOSPITAL_BASED_OUTPATIENT_CLINIC_OR_DEPARTMENT_OTHER)
Admission: EM | Admit: 2023-06-18 | Discharge: 2023-06-18 | Disposition: A | Discharge status: Home / Self Care | Payer: Medicare HMO | Attending: Emergency Medicine | Admitting: Emergency Medicine

## 2023-06-18 ENCOUNTER — Encounter (HOSPITAL_BASED_OUTPATIENT_CLINIC_OR_DEPARTMENT_OTHER): Payer: Self-pay

## 2023-06-18 DIAGNOSIS — Z79899 Other long term (current) drug therapy: Secondary | ICD-10-CM | POA: Insufficient documentation

## 2023-06-18 DIAGNOSIS — I509 Heart failure, unspecified: Secondary | ICD-10-CM | POA: Diagnosis not present

## 2023-06-18 DIAGNOSIS — W19XXXA Unspecified fall, initial encounter: Secondary | ICD-10-CM | POA: Diagnosis not present

## 2023-06-18 DIAGNOSIS — I110 Hypertensive heart disease with heart failure: Secondary | ICD-10-CM | POA: Diagnosis not present

## 2023-06-18 DIAGNOSIS — Z7982 Long term (current) use of aspirin: Secondary | ICD-10-CM | POA: Diagnosis not present

## 2023-06-18 DIAGNOSIS — Z23 Encounter for immunization: Secondary | ICD-10-CM | POA: Insufficient documentation

## 2023-06-18 DIAGNOSIS — Z7902 Long term (current) use of antithrombotics/antiplatelets: Secondary | ICD-10-CM | POA: Insufficient documentation

## 2023-06-18 DIAGNOSIS — S8992XA Unspecified injury of left lower leg, initial encounter: Secondary | ICD-10-CM | POA: Diagnosis present

## 2023-06-18 DIAGNOSIS — S81012A Laceration without foreign body, left knee, initial encounter: Secondary | ICD-10-CM | POA: Diagnosis not present

## 2023-06-18 MED ORDER — TETANUS-DIPHTH-ACELL PERTUSSIS 5-2.5-18.5 LF-MCG/0.5 IM SUSY
0.5000 mL | PREFILLED_SYRINGE | Freq: Once | INTRAMUSCULAR | Status: AC
  Administered 2023-06-18: 0.5 mL via INTRAMUSCULAR
  Filled 2023-06-18: qty 0.5

## 2023-06-18 MED ORDER — LIDOCAINE-EPINEPHRINE (PF) 2 %-1:200000 IJ SOLN
10.0000 mL | Freq: Once | INTRAMUSCULAR | Status: AC
  Administered 2023-06-18: 10 mL via INTRADERMAL
  Filled 2023-06-18: qty 20

## 2023-06-18 NOTE — Discharge Instructions (Signed)
You were seen for your cut (laceration) in the emergency department.   At home, please keep the area completely dry for 24 hours.  After that you may wash with soap and water but do not submerge until your stitches are removed or they fall out.  Use bacitracin or Neosporin topical antibiotics to prevent infection.  Scars are unavoidable but they can be less noticeable if you prevent sun exposure by covering with clothing or wearing sunscreen over the area for the next 6 months.   Follow-up to have your stitches removed at your primary doctor's office, urgent care, or emergency department in 14 days.  Return immediately to the emergency department if you experience any of the following: Drainage from your wound, redness around your wound, fevers, or any other concerning symptoms.    Thank you for visiting our Emergency Department. It was a pleasure taking care of you today.

## 2023-06-18 NOTE — ED Notes (Signed)
 RN reviewed discharge instructions with pt. Pt verbalized understanding and had no further questions. VSS upon discharge.  

## 2023-06-18 NOTE — ED Provider Notes (Signed)
[facility] Provider Note   CSN: [id number] Arrival date & time: 06/18/23  1807     History  Chief Complaint  Patient presents with   fall on thinners    [name] is a 89 y.o. female.  89 year old female with history of PE on warfarin, chronic hypoxia on 2 L nasal cannula, hypertension, CHF, and chronic lower extremity wounds who presents emergency department with left knee trauma.  History obtained per the patient's caregiver and the patient.  She typically sleeps in a chair and last night at 2:40 AM got up without asking for help.  Slid out of the chair onto the ground.  Did strike her left knee on the way down and cut it.  No head strike or LOC.  911 was called to help her get off the ground.  She was on the ground for less than 20 minutes.  They did not notice any signs of head trauma but did notice left knee laceration.  Over the course of the day the laceration appears to have grown and she was brought in for evaluation after the recommendation of her primary doctor.  No headache, nausea or vomiting, or neck pain.  Denies pain elsewhere from the fall.       Home Medications Prior to Admission medications   Medication Sig Start Date End Date Taking? Authorizing Provider  acetaminophen (TYLENOL) 325 MG tablet Take 2 tablets (650 mg total) by mouth every 6 (six) hours as needed for mild pain or headache. Patient taking differently: Take 650 mg by mouth every 6 (six) hours as needed for mild pain, headache or fever. 11/06/22   Almon Hercules, MD  ascorbic acid (VITAMIN C) 500 MG tablet Take 500 mg by mouth daily.    [provider]  aspirin EC 81 MG tablet Take 1 tablet (81 mg total) by mouth daily. Swallow whole. 03/04/23 03/03/24  [name], MD  atorvastatin (LIPITOR) 10 MG tablet Take 1 tablet (10 mg total) by mouth daily. 03/04/23 03/03/24  [name], MD  bumetanide (BUMEX) 0.5 MG tablet Take 0.5 mg by mouth  daily.    [provider]  carboxymethylcellulose (REFRESH PLUS) 0.5 % SOLN Place 1 drop into both eyes 2 (two) times daily.    [provider]  diclofenac Sodium (VOLTAREN) 1 % GEL Apply 4 g topically 4 (four) times daily. To bilateral feet and knees Patient taking differently: Apply 4 g topically 4 (four) times daily as needed (pain). To bilateral feet and knees 11/06/22   Candelaria Stagers T, MD  estradiol (ESTRACE) 0.1 MG/GM vaginal cream Place 1 Applicatorful vaginally daily. 04/21/23   [provider]  gabapentin (NEURONTIN) 300 MG capsule Take 300 mg by mouth 2 (two) times daily.     [provider]  levothyroxine (SYNTHROID) 100 MCG tablet Take 100 mcg by mouth daily before breakfast. 09/05/21   [provider]  Multiple Vitamins-Minerals (PRESERVISION/LUTEIN) CAPS Take 1 capsule by mouth 2 (two) times daily.     [provider]  ondansetron (ZOFRAN) 4 MG tablet Take 4 mg by mouth daily as needed. 04/17/23   [provider]  OVER THE COUNTER MEDICATION Take 1 capsule by mouth at bedtime. Equate Neuriva    [provider]  pantoprazole (PROTONIX) 40 MG tablet Take 40 mg by mouth daily.    [provider]  senna-docusate (SENOKOT-S) 8.6-50 MG tablet Take 1 tablet by mouth 2 (two) times daily  between meals as needed for mild constipation. Patient taking differently: Take 1 tablet by mouth at bedtime. 11/06/22   Almon Hercules, MD  vitamin B-12 (CYANOCOBALAMIN) 1000 MCG tablet Take 1,000 mcg by mouth daily.    [provider]  warfarin (COUMADIN) 1 MG tablet Take 1-2 mg by mouth as directed. 1 MG (Mon,Wed,Fri) & 2 MG on (Tues,Thurs,Sat,Sun)    [provider]  zinc gluconate 50 MG tablet Take 50 mg by mouth daily.    [provider]      Allergies    Amoxicillin, Azithromycin, Codeine, Erythromycin, Green dyes, Iodine, Misc. sulfonamide containing compounds, Oxycodone, Oxycodone-acetaminophen,  Penicillins, Sulfa antibiotics, Sulfasalazine, and Tramadol    Review of Systems   Review of Systems  Physical Exam Updated Vital Signs BP (!) 173/84   Pulse 81   Temp 98.6 F (37 C) (Oral)   Resp 17   Ht 5\' 4"  (1.626 m)   Wt 69.9 kg   SpO2 100%   BMI 26.43 kg/m  Physical Exam Vitals and nursing note reviewed.  Constitutional:      General: She is not in acute distress.    Appearance: Normal appearance. She is well-developed. She is not ill-appearing.  HENT:     Head: Normocephalic and atraumatic.     Comments: No Battle sign or raccoon eyes    Right Ear: External ear normal.     Left Ear: External ear normal.     Nose: Nose normal.     Mouth/Throat:     Mouth: Mucous membranes are moist.     Pharynx: Oropharynx is clear.  Eyes:     Extraocular Movements: Extraocular movements intact.     Conjunctiva/sclera: Conjunctivae normal.     Pupils: Pupils are equal, round, and reactive to light.     Comments: 3 mm and reactive bilaterally  Neck:     Comments: No C-spine midline tenderness to palpation Cardiovascular:     Rate and Rhythm: Normal rate and regular rhythm.     Pulses: Normal pulses.     Heart sounds: Normal heart sounds. No murmur heard. Pulmonary:     Effort: Pulmonary effort is normal. No respiratory distress.     Breath sounds: Normal breath sounds.  Abdominal:     General: Abdomen is flat. There is no distension.     Palpations: There is no mass.     Tenderness: There is no abdominal tenderness. There is no guarding.  Musculoskeletal:        General: No deformity. Normal range of motion.     Cervical back: Normal range of motion and neck supple. No rigidity or tenderness.     Right lower leg: Edema present.     Left lower leg: Edema present.     Comments: No tenderness to palpation of midline thoracic or lumbar spine.  No step-offs palpated.  No tenderness to palpation of chest wall.  No bruising noted.  No tenderness to palpation of bilateral  clavicles.  No tenderness to palpation, bruising, or deformities noted of bilateral shoulders, elbows, wrists, hips, or ankles.  5 cm laceration of left anterior knee.  Bleeding controlled.  Small amount of bruising surrounding it.  No deformity of the left knee.  Skin:    General: Skin is warm and dry.  Neurological:     General: No focal deficit present.     Mental Status: She is alert. Mental status is at baseline.     Cranial Nerves: No cranial nerve deficit.  Sensory: No sensory deficit.     Motor: No weakness.     Comments: Alert and oriented to self and place.  When asked the year states that "I do not care what year it is".  At baseline per caregiver.  Psychiatric:        Mood and Affect: Mood normal.     ED Results / Procedures / Treatments   Labs (all labs ordered are listed, but only abnormal results are displayed) Labs Reviewed - No data to display  EKG None  Radiology DG Knee Complete 4 Views Left  Result Date: 06/18/2023 CLINICAL DATA:  Left knee pain. Fell last night while trying to go to bathroom. EXAM: LEFT KNEE - COMPLETE 4+ VIEW COMPARISON:  Radiographs 05/05/2023 FINDINGS: No acute fracture or dislocation. Tricompartmental degenerative arthritis left knee with advanced medial compartment narrowing. Small knee joint effusion. Vascular calcifications. IMPRESSION: 1.  No acute fracture or dislocation. 2. Degenerative arthritis left knee with advanced medial compartment narrowing. Electronically Signed   By: Minerva Fester M.D.   On: 06/18/2023 20:22    Procedures .[name] KitchenLaceration Repair  Date/Time: 06/20/2023 12:13 AM  Performed by: Rondel Baton, MD Authorized by: Rondel Baton, MD   Consent:    Consent obtained:  Verbal   Consent given by:  Patient and guardian   Risks, benefits, and alternatives were discussed: yes     Risks discussed:  Infection, pain, poor cosmetic result and need for additional repair   Alternatives discussed:  No  treatment Universal protocol:    Patient identity confirmed:  Hospital-assigned identification number Anesthesia:    Anesthesia method:  Local infiltration   Local anesthetic:  Lidocaine 2% WITH epi Laceration details:    Location:  Leg   Leg location:  L knee   Length (cm):  6 Exploration:    Imaging obtained: x-ray     Imaging outcome: foreign body not noted     Wound exploration: wound explored through full range of motion and entire depth of wound visualized   Treatment:    Area cleansed with:  Saline   Amount of cleaning:  Standard   Irrigation solution:  Sterile saline   Irrigation method:  Pressure wash Skin repair:    Repair method:  Sutures   Suture size:  4-0   Suture material:  Prolene   Suture technique:  Horizontal mattress   Number of sutures:  2 Approximation:    Approximation:  Loose Repair type:    Repair type:  Simple Post-procedure details:    Dressing:  Antibiotic ointment   Procedure completion:  Tolerated well, no immediate complications     Medications Ordered in ED Medications  lidocaine-EPINEPHrine (XYLOCAINE W/EPI) 2 %-1:200000 (PF) injection 10 mL (10 mLs Intradermal Given 06/18/23 1843)  Tdap (BOOSTRIX) injection 0.5 mL (0.5 mLs Intramuscular Given 06/18/23 1841)    ED Course/ Medical Decision Making/ A&P                                 Medical Decision Making Amount and/or Complexity of Data Reviewed Radiology: ordered.  Risk Prescription drug management.   ARIONNE IAMS is a 89 y.o. female with comorbidities that complicate the patient evaluation including PE on warfarin, chronic hypoxia on 2 L nasal cannula, hypertension, CHF, and chronic lower extremity wounds who presents emergency department with left knee trauma.    Initial Ddx:  Fracture, laceration, infection  MDM/Course:  Patient presents  to the emergency department with knee pain after a fall.  No head strike or LOC.  No altered mental status that would suggest a TBI.   On exam does have a laceration of the left knee.  Has been over 12 hours since she fell and cut her leg so is at somewhat higher risk of infection.  Area was copiously irrigated.  Tetanus shot was updated.  Did perform loose approximation of the laceration specially since she has a history of slow and delayed wound healing.  This was to assist with wound healing but not increase the risk of infection significantly.  X-rays were obtained which did not show any foreign body or fracture.  Upon re-evaluation patient remained stable.  Will have her follow-up with her primary doctor and have the stitches taken out in 2 weeks.  Instructed family member to dress the wound with antibiotic ointment such as bacitracin or Neosporin.  This patient presents to the ED for concern of complaints listed in HPI, this involves an extensive number of treatment options, and is a complaint that carries with it a high risk of complications and morbidity. Disposition including potential need for admission considered.   Dispo: DC Home. Return precautions discussed including, but not limited to, those listed in the AVS. Allowed pt time to ask questions which were answered fully prior to dc.  Additional history obtained from daughter Records reviewed Outpatient Clinic Notes I independently reviewed the following imaging with scope of interpretation limited to determining acute life threatening conditions related to emergency care: Extremity x-ray(s) and agree with the radiologist interpretation with the following exceptions: none I have reviewed the patients home medications and made adjustments as needed Social Determinants of health:  Elderly  Portions of this note were generated with Scientist, clinical (histocompatibility and immunogenetics). Dictation errors may occur despite best attempts at proofreading.           Final Clinical Impression(s) / ED Diagnoses Final diagnoses:  Laceration of left knee, initial encounter  Fall, initial encounter     Rx / DC Orders ED Discharge Orders     None         Rondel Baton, MD 06/20/23 [phone]

## 2023-06-18 NOTE — ED Triage Notes (Signed)
Pt POV from home with daughter, fell last night unattended while trying to go to bathroom, found sitting on bottom, family unable to get pt up, assisted back to bed by EMS. LAC noted below L knee, per EMS pt did not need to be seen last night, seen by PCP today who told family to bring pt in for sutures. Pt takes warfarin, denies hitting head or pain, states she "slid out of bed".

## 2023-06-25 ENCOUNTER — Encounter (HOSPITAL_BASED_OUTPATIENT_CLINIC_OR_DEPARTMENT_OTHER): Payer: Medicare HMO | Admitting: General Surgery

## 2023-06-25 DIAGNOSIS — L89623 Pressure ulcer of left heel, stage 3: Secondary | ICD-10-CM | POA: Diagnosis not present

## 2023-06-26 NOTE — Progress Notes (Signed)
[name], [name] ([id number]) [id number] Page 1 of 12 Visit Report for 06/25/2023 Arrival Information Details Patient Name: Date of Service: [name], [name] 06/25/2023 2:45 PM Medical Record Number: [id number] Patient Account Number: [id number] Date of Birth/Sex: Treating RN: [birth year] (89 y.o. [name], [name] Primary Care Herbie Lehrmann: [name] Other Clinician: Referring Dmiya Malphrus: Treating Matilynn Dacey/Extender: Claudie Leach Weeks in Treatment: 15 Visit Information History Since Last Visit Added or deleted any medications: No Patient Arrived: Wheel Chair Any new allergies or adverse reactions: No Arrival Time: 14:47 Had a fall or experienced change in Yes Accompanied By: daughter activities of daily living that may affect Transfer Assistance: None risk of falls: Patient Identification Verified: Yes Signs or symptoms of abuse/neglect since last visito No Secondary Verification Process Completed: Yes Hospitalized since last visit: No Patient Requires Transmission-Based Precautions: No Implantable device outside of the clinic excluding No Patient Has Alerts: Yes cellular tissue based products placed in the center Patient Alerts: ABI R: 0.48 L: 0.46 2/24 since last visit: TBI R: 0.39 L: 0.21 2/24 Has Dressing in Place as Prescribed: Yes Pain Present Now: No Electronic Signature(s) Signed: 06/26/2023 12:37:09 PM By: Zenaida Deed RN, BSN Entered By: Zenaida Deed on 06/25/2023 11:49:24 -------------------------------------------------------------------------------- Lower Extremity Assessment Details Patient Name: Date of Service: Megan Reap. 06/25/2023 2:45 PM Medical Record Number: [id number] Patient Account Number: [id number] Date of Birth/Sex: Treating RN: December 08, 1927 (89 y.o. [name] Primary Care Kali Deadwyler: [name] Other Clinician: Referring Abad Manard: Treating Halea Lieb/Extender: Claudie Leach Weeks in Treatment: 15 Edema Assessment Assessed: [Left: No] [Right: No] Edema: [Left: Yes] [Right: Yes] Calf Left: Right: Point of Measurement: From Medial Instep 39 cm 36.5 cm Ankle Left: Right: Point of Measurement: From Medial Instep 24.5 cm 24 cm Vascular Assessment Pulses: Dorsalis Pedis Palpable: [Left:No] [Right:No] Extremity colors, hair growth, and conditions: [name], [name] ([id number]) [Right:[id number] Page 2 of 12] Extremity Color: [Left:Hyperpigmented] [Right:Hyperpigmented] Hair Growth on Extremity: [Left:No] [Right:No] Temperature of Extremity: [Left:Warm < 3 seconds] [Right:Warm < 3 seconds] Electronic Signature(s) Signed: 06/26/2023 12:37:09 PM By: Zenaida Deed RN, BSN Entered By: Zenaida Deed on 06/25/2023 12:03:28 -------------------------------------------------------------------------------- Multi Wound Chart Details Patient Name: Date of Service: Megan Reap. 06/25/2023 2:45 PM Medical Record Number: [id number] Patient Account Number: [id number] Date of Birth/Sex: Treating RN: Nov 14, 1927 (89 y.o. F) Primary Care Chancellor Vanderloop: [name] Other Clinician: Referring Brilynn Biasi: Treating Ladonte Verstraete/Extender: Claudie Leach Weeks in Treatment: 15 Vital Signs Height(in): 64 Pulse(bpm): 68 Weight(lbs): 157 Blood Pressure(mmHg): 104/56 Body Mass Index(BMI): 26.9 Temperature(F): 97.8 Respiratory Rate(breaths/min): 18 [10:Photos:] Left T Second oe Right T Second oe Left, Dorsal T Great oe Wound Location: Pressure Injury Pressure Injury Gradually Appeared Wounding Event: Pressure Ulcer Pressure Ulcer Arterial Insufficiency Ulcer Primary Etiology: Cataracts, Anemia, Arrhythmia, Cataracts, Anemia, Arrhythmia, Cataracts, Anemia, Arrhythmia, Comorbid History: Congestive Heart Failure, Congestive Heart Failure, Congestive Heart Failure, Hypotension, Peripheral Venous Hypotension, Peripheral Venous  Hypotension, Peripheral Venous Disease, Osteomyelitis, Neuropathy Disease, Osteomyelitis, Neuropathy Disease, Osteomyelitis, Neuropathy 10/09/2022 10/09/2022 03/24/2023 Date Acquired: 15 15 13  Weeks of Treatment: Open Open Open Wound Status: No No No Wound Recurrence: 0.4x0.8x0.1 0.5x0.8x0.2 1.1x1.6x0.2 Measurements L x W x D (cm) 0.251 0.314 1.382 A (cm) : rea 0.025 0.063 0.276 Volume (cm) : 64.50% 55.60% -1649.40% % Reduction in Area: 64.80% 11.30% -3350.00% % Reduction in Volume: No No No Undermining: Category/Stage IV Category/Stage IV Full Thickness With Exposed Support Classification: Structures Medium Medium Medium Exudate Amount: Serous Serosanguineous Serous Exudate Type: amber red, brown amber Exudate Color:  [name], [name] ([id number]) [id number] Page 1 of 12 Visit Report for 06/25/2023 Arrival Information Details Patient Name: Date of Service: [name], [name] 06/25/2023 2:45 PM Medical Record Number: [id number] Patient Account Number: [id number] Date of Birth/Sex: Treating RN: [birth year] (89 y.o. [name], [name] Primary Care Herbie Lehrmann: [name] Other Clinician: Referring Dmiya Malphrus: Treating Matilynn Dacey/Extender: Claudie Leach Weeks in Treatment: 15 Visit Information History Since Last Visit Added or deleted any medications: No Patient Arrived: Wheel Chair Any new allergies or adverse reactions: No Arrival Time: 14:47 Had a fall or experienced change in Yes Accompanied By: daughter activities of daily living that may affect Transfer Assistance: None risk of falls: Patient Identification Verified: Yes Signs or symptoms of abuse/neglect since last visito No Secondary Verification Process Completed: Yes Hospitalized since last visit: No Patient Requires Transmission-Based Precautions: No Implantable device outside of the clinic excluding No Patient Has Alerts: Yes cellular tissue based products placed in the center Patient Alerts: ABI R: 0.48 L: 0.46 2/24 since last visit: TBI R: 0.39 L: 0.21 2/24 Has Dressing in Place as Prescribed: Yes Pain Present Now: No Electronic Signature(s) Signed: 06/26/2023 12:37:09 PM By: Zenaida Deed RN, BSN Entered By: Zenaida Deed on 06/25/2023 11:49:24 -------------------------------------------------------------------------------- Lower Extremity Assessment Details Patient Name: Date of Service: Megan Reap. 06/25/2023 2:45 PM Medical Record Number: [id number] Patient Account Number: [id number] Date of Birth/Sex: Treating RN: December 08, 1927 (89 y.o. [name] Primary Care Kali Deadwyler: [name] Other Clinician: Referring Abad Manard: Treating Halea Lieb/Extender: Claudie Leach Weeks in Treatment: 15 Edema Assessment Assessed: [Left: No] [Right: No] Edema: [Left: Yes] [Right: Yes] Calf Left: Right: Point of Measurement: From Medial Instep 39 cm 36.5 cm Ankle Left: Right: Point of Measurement: From Medial Instep 24.5 cm 24 cm Vascular Assessment Pulses: Dorsalis Pedis Palpable: [Left:No] [Right:No] Extremity colors, hair growth, and conditions: [name], [name] ([id number]) [Right:[id number] Page 2 of 12] Extremity Color: [Left:Hyperpigmented] [Right:Hyperpigmented] Hair Growth on Extremity: [Left:No] [Right:No] Temperature of Extremity: [Left:Warm < 3 seconds] [Right:Warm < 3 seconds] Electronic Signature(s) Signed: 06/26/2023 12:37:09 PM By: Zenaida Deed RN, BSN Entered By: Zenaida Deed on 06/25/2023 12:03:28 -------------------------------------------------------------------------------- Multi Wound Chart Details Patient Name: Date of Service: Megan Reap. 06/25/2023 2:45 PM Medical Record Number: [id number] Patient Account Number: [id number] Date of Birth/Sex: Treating RN: Nov 14, 1927 (89 y.o. F) Primary Care Chancellor Vanderloop: [name] Other Clinician: Referring Brilynn Biasi: Treating Ladonte Verstraete/Extender: Claudie Leach Weeks in Treatment: 15 Vital Signs Height(in): 64 Pulse(bpm): 68 Weight(lbs): 157 Blood Pressure(mmHg): 104/56 Body Mass Index(BMI): 26.9 Temperature(F): 97.8 Respiratory Rate(breaths/min): 18 [10:Photos:] Left T Second oe Right T Second oe Left, Dorsal T Great oe Wound Location: Pressure Injury Pressure Injury Gradually Appeared Wounding Event: Pressure Ulcer Pressure Ulcer Arterial Insufficiency Ulcer Primary Etiology: Cataracts, Anemia, Arrhythmia, Cataracts, Anemia, Arrhythmia, Cataracts, Anemia, Arrhythmia, Comorbid History: Congestive Heart Failure, Congestive Heart Failure, Congestive Heart Failure, Hypotension, Peripheral Venous Hypotension, Peripheral Venous  Hypotension, Peripheral Venous Disease, Osteomyelitis, Neuropathy Disease, Osteomyelitis, Neuropathy Disease, Osteomyelitis, Neuropathy 10/09/2022 10/09/2022 03/24/2023 Date Acquired: 15 15 13  Weeks of Treatment: Open Open Open Wound Status: No No No Wound Recurrence: 0.4x0.8x0.1 0.5x0.8x0.2 1.1x1.6x0.2 Measurements L x W x D (cm) 0.251 0.314 1.382 A (cm) : rea 0.025 0.063 0.276 Volume (cm) : 64.50% 55.60% -1649.40% % Reduction in Area: 64.80% 11.30% -3350.00% % Reduction in Volume: No No No Undermining: Category/Stage IV Category/Stage IV Full Thickness With Exposed Support Classification: Structures Medium Medium Medium Exudate Amount: Serous Serosanguineous Serous Exudate Type: amber red, brown amber Exudate Color:  chieved: N/A 4 4 Procedural Pain: N/A 2 2 Post Procedural Pain: N/A Procedure was tolerated well Procedure was tolerated well Debridement Treatment Response: N/A 3x6x0.3 0.3x0.3x0.4 Post Debridement Measurements L x W x D (cm) Marschner, Isabelle [name] ([id number]) [id number] Page 4 of 12 N/A 4.241 0.028 Post Debridement Volume: (cm) N/A N/A Category/Stage II Post Debridement Stage: No Abnormalities Noted No Abnormalities Noted Callus: Yes Periwound Skin Texture: No Abnormalities Noted No Abnormalities Noted  Dry/Scaly: Yes Periwound Skin Moisture: No Abnormalities Noted Ecchymosis: Yes No Abnormalities Noted Periwound Skin Color: No Abnormality No Abnormality No Abnormality Temperature: Yes Yes Yes Tenderness on Palpation: N/A Debridement Debridement Procedures Performed: Treatment Notes Electronic Signature(s) Signed: 06/25/2023 4:23:08 PM By: Duanne Guess MD FACS Entered By: Duanne Guess on 06/25/2023 13:23:08 -------------------------------------------------------------------------------- Multi-Disciplinary Care Plan Details Patient Name: Date of Service: Megan Reap. 06/25/2023 2:45 PM Medical Record Number: [id number] Patient Account Number: [id number] Date of Birth/Sex: Treating RN: 02-Dec-1927 (89 y.o. [name] Primary Care Adesuwa Osgood: [name] Other Clinician: Referring Makhai Fulco: Treating Jimmie Rueter/Extender: Tiajuana Amass in Treatment: 15 Multidisciplinary Care Plan reviewed with physician Active Inactive Abuse / Safety / Falls / Self Care Management Nursing Diagnoses: Impaired home maintenance Impaired physical mobility Potential for falls Goals: Patient/caregiver will verbalize/demonstrate measure taken to improve self care Date Initiated: 03/10/2023 Target Resolution Date: 07/10/2023 Goal Status: Active Patient/caregiver will verbalize/demonstrate measures taken to improve the patient's personal safety Date Initiated: 03/10/2023 Target Resolution Date: 07/10/2023 Goal Status: Active Interventions: Assess fall risk on admission and as needed Assess: immobility, friction, shearing, incontinence upon admission and as needed Provide education on fall prevention Notes: pt fell 06/18/23 injuring left knee Wound/Skin Impairment Nursing Diagnoses: Impaired tissue integrity Knowledge deficit related to ulceration/compromised skin integrity Goals: Patient/caregiver will verbalize understanding of skin care regimen Date Initiated:  03/10/2023 Target Resolution Date: 07/10/2023 Goal Status: Active Interventions: Assess patient/caregiver ability to obtain necessary supplies Assess patient/caregiver ability to perform ulcer/skin care regimen upon admission and as needed Assess ulceration(s) every visit [name], [name] ([id number]) [id number] Page 5 of 12 Treatment Activities: Skin care regimen initiated : 03/10/2023 Topical wound management initiated : 03/10/2023 Notes: Electronic Signature(s) Signed: 06/26/2023 12:37:09 PM By: Zenaida Deed RN, BSN Entered By: Zenaida Deed on 06/25/2023 12:19:49 -------------------------------------------------------------------------------- Pain Assessment Details Patient Name: Date of Service: Megan Reap. 06/25/2023 2:45 PM Medical Record Number: [id number] Patient Account Number: [id number] Date of Birth/Sex: Treating RN: 09/26/1927 (89 y.o. [name] Primary Care Aleeyah Bensen: [name] Other Clinician: Referring Florentino Laabs: Treating Llewellyn Schoenberger/Extender: Claudie Leach Weeks in Treatment: 15 Active Problems Location of Pain Severity and Description of Pain Patient Has Paino No Site Locations Rate the pain. Current Pain Level: 0 Pain Management and Medication Current Pain Management: Electronic Signature(s) Signed: 06/26/2023 12:37:09 PM By: Zenaida Deed RN, BSN Entered By: Zenaida Deed on 06/25/2023 11:50:12 -------------------------------------------------------------------------------- Patient/Caregiver Education Details Patient Name: Date of Service: Megan Reap 10/17/2024andnbsp2:45 PM Medical Record Number: [id number] Patient Account Number: [id number] Date of Birth/Gender: Treating RN: 12/04/1927 (89 y.o. [name] Primary Care Physician: [name] Other Clinician: Referring Physician: Treating Physician/Extender: Claudie Leach Weeks in Treatment: [address] [name]  ([id number]) [id number] Page 6 of 12 Education Assessment Education Provided To: Patient Education Topics Provided Wound/Skin Impairment: Methods: Explain/Verbal Responses: Reinforcements needed, State content correctly Electronic Signature(s) Signed: 06/26/2023 12:37:09 PM By: Zenaida Deed RN, BSN Entered By: Zenaida Deed on 06/25/2023 12:20:25 -------------------------------------------------------------------------------- Wound Assessment Details Patient Name: Date of Service: [name], [name]  Clustered Wound: No Neuropathy Photos Wound Measurements Length: (cm) 1.1 Width: (cm) 1.6 Depth: (cm) 0.2 Area: (cm) 1.382 Volume: (cm) 0.276 % Reduction in Area: -1649.4% % Reduction in Volume: -3350% Epithelialization: Small (1-33%) Tunneling: No Undermining: No Wound Description Classification: Full Thickness With Exposed Supp Wound Margin: Distinct, outline attached Exudate Amount: Medium Exudate Type: Serous Exudate Color: amber ort Structures Foul Odor After Cleansing: No Slough/Fibrino Yes Wound Bed [name],  Genean [name] ([id number]) [id number] Page 9 of 12 Granulation Amount: Small (1-33%) Exposed Structure Granulation Quality: Red Fascia Exposed: No Necrotic Amount: Large (67-100%) Fat Layer (Subcutaneous Tissue) Exposed: Yes Necrotic Quality: Adherent Slough, Bone Tendon Exposed: No Muscle Exposed: No Joint Exposed: No Bone Exposed: Yes Periwound Skin Texture Texture Color No Abnormalities Noted: Yes No Abnormalities Noted: Yes Moisture Temperature / Pain No Abnormalities Noted: Yes Temperature: No Abnormality Tenderness on Palpation: Yes Electronic Signature(s) Signed: 06/26/2023 12:37:09 PM By: Zenaida Deed RN, BSN Entered By: Zenaida Deed on 06/25/2023 12:12:05 -------------------------------------------------------------------------------- Wound Assessment Details Patient Name: Date of Service: Megan Reap. 06/25/2023 2:45 PM Medical Record Number: [id number] Patient Account Number: [id number] Date of Birth/Sex: Treating RN: 11/03/1927 (89 y.o. 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, Dorsal T Great oe Wound Healed - Epithelialized Wounding Event: Gradually Appeared Status: Date Acquired: 03/26/2023 Comorbid Cataracts, Anemia, Arrhythmia, Congestive Heart Failure, Weeks Of Treatment: 13 History: Hypotension, Peripheral Venous Disease, Osteomyelitis, Clustered Wound: No Neuropathy Photos Wound Measurements Length: (cm) Width: (cm) Depth: (cm) Area: (cm) Volume: (cm) 0 % Reduction in Area: 100% 0 % Reduction in Volume: 100% 0 Epithelialization: Large (67-100%) 0 Tunneling: No 0 Undermining: No Wound Description Classification: Full Thickness Without Exposed Suppo Wound Margin: Flat and  Intact Exudate Amount: None Present rt Structures Foul Odor After Cleansing: No Slough/Fibrino No Wound Bed Granulation Amount: None Present (0%) Exposed Structure [name], [name] ([id number]) [id number] Page 10 of 12 Necrotic Amount: None Present (0%) Fascia Exposed: No Fat Layer (Subcutaneous Tissue) Exposed: No Tendon Exposed: No Muscle Exposed: No Joint Exposed: No Bone Exposed: No Periwound Skin Texture Texture Color No Abnormalities Noted: Yes No Abnormalities Noted: Yes Moisture Temperature / Pain No Abnormalities Noted: Yes Temperature: No Abnormality Tenderness on Palpation: Yes Electronic Signature(s) Signed: 06/26/2023 12:37:09 PM By: Zenaida Deed RN, BSN Entered By: Zenaida Deed on 06/25/2023 12:37:25 -------------------------------------------------------------------------------- Wound Assessment Details Patient Name: Date of Service: Megan Reap. 06/25/2023 2:45 PM Medical Record Number: [id number] Patient Account Number: [id number] Date of Birth/Sex: Treating RN: [birth year] (89 y.o. [name], [name] Primary Care Vertis Scheib: [name] Other Clinician: Referring Royden Bulman: Treating Shivank Pinedo/Extender: Claudie Leach Weeks in Treatment: 15 Wound Status Wound Number: 16 Primary Abrasion Etiology: Wound Location: Left Knee Wound Open Wounding Event: Trauma Status: Date Acquired: 06/18/2023 Comorbid Cataracts, Anemia, Arrhythmia, Congestive Heart Failure, Weeks Of Treatment: 0 History: Hypotension, Peripheral Venous Disease, Osteomyelitis, Clustered Wound: No Neuropathy Photos Wound Measurements Length: (cm) 3 Width: (cm) 6 Depth: (cm) 0.3 Area: (cm) 14.137 Volume: (cm) 4.241 % Reduction in Area: % Reduction in Volume: Epithelialization: Small (1-33%) Tunneling: No Undermining: Yes Starting Position (o'clock): 6 Ending Position (o'clock): 7 Maximum Distance: (cm) 1.2 Wound  Description Classification: Full Thickness Without Exposed Suppo Wound Margin: Distinct, outline attached Exudate Amount: Medium Exudate Type: Serosanguineous Covey, [name] ([id number]) Exudate Color: red, brown rt Structures Foul Odor After Cleansing: No Slough/Fibrino  Clustered Wound: No Neuropathy Photos Wound Measurements Length: (cm) 1.1 Width: (cm) 1.6 Depth: (cm) 0.2 Area: (cm) 1.382 Volume: (cm) 0.276 % Reduction in Area: -1649.4% % Reduction in Volume: -3350% Epithelialization: Small (1-33%) Tunneling: No Undermining: No Wound Description Classification: Full Thickness With Exposed Supp Wound Margin: Distinct, outline attached Exudate Amount: Medium Exudate Type: Serous Exudate Color: amber ort Structures Foul Odor After Cleansing: No Slough/Fibrino Yes Wound Bed [name],  Genean [name] ([id number]) [id number] Page 9 of 12 Granulation Amount: Small (1-33%) Exposed Structure Granulation Quality: Red Fascia Exposed: No Necrotic Amount: Large (67-100%) Fat Layer (Subcutaneous Tissue) Exposed: Yes Necrotic Quality: Adherent Slough, Bone Tendon Exposed: No Muscle Exposed: No Joint Exposed: No Bone Exposed: Yes Periwound Skin Texture Texture Color No Abnormalities Noted: Yes No Abnormalities Noted: Yes Moisture Temperature / Pain No Abnormalities Noted: Yes Temperature: No Abnormality Tenderness on Palpation: Yes Electronic Signature(s) Signed: 06/26/2023 12:37:09 PM By: Zenaida Deed RN, BSN Entered By: Zenaida Deed on 06/25/2023 12:12:05 -------------------------------------------------------------------------------- Wound Assessment Details Patient Name: Date of Service: Megan Reap. 06/25/2023 2:45 PM Medical Record Number: [id number] Patient Account Number: [id number] Date of Birth/Sex: Treating RN: 11/03/1927 (89 y.o. 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, Dorsal T Great oe Wound Healed - Epithelialized Wounding Event: Gradually Appeared Status: Date Acquired: 03/26/2023 Comorbid Cataracts, Anemia, Arrhythmia, Congestive Heart Failure, Weeks Of Treatment: 13 History: Hypotension, Peripheral Venous Disease, Osteomyelitis, Clustered Wound: No Neuropathy Photos Wound Measurements Length: (cm) Width: (cm) Depth: (cm) Area: (cm) Volume: (cm) 0 % Reduction in Area: 100% 0 % Reduction in Volume: 100% 0 Epithelialization: Large (67-100%) 0 Tunneling: No 0 Undermining: No Wound Description Classification: Full Thickness Without Exposed Suppo Wound Margin: Flat and  Intact Exudate Amount: None Present rt Structures Foul Odor After Cleansing: No Slough/Fibrino No Wound Bed Granulation Amount: None Present (0%) Exposed Structure [name], [name] ([id number]) [id number] Page 10 of 12 Necrotic Amount: None Present (0%) Fascia Exposed: No Fat Layer (Subcutaneous Tissue) Exposed: No Tendon Exposed: No Muscle Exposed: No Joint Exposed: No Bone Exposed: No Periwound Skin Texture Texture Color No Abnormalities Noted: Yes No Abnormalities Noted: Yes Moisture Temperature / Pain No Abnormalities Noted: Yes Temperature: No Abnormality Tenderness on Palpation: Yes Electronic Signature(s) Signed: 06/26/2023 12:37:09 PM By: Zenaida Deed RN, BSN Entered By: Zenaida Deed on 06/25/2023 12:37:25 -------------------------------------------------------------------------------- Wound Assessment Details Patient Name: Date of Service: Megan Reap. 06/25/2023 2:45 PM Medical Record Number: [id number] Patient Account Number: [id number] Date of Birth/Sex: Treating RN: [birth year] (89 y.o. [name], [name] Primary Care Vertis Scheib: [name] Other Clinician: Referring Royden Bulman: Treating Shivank Pinedo/Extender: Claudie Leach Weeks in Treatment: 15 Wound Status Wound Number: 16 Primary Abrasion Etiology: Wound Location: Left Knee Wound Open Wounding Event: Trauma Status: Date Acquired: 06/18/2023 Comorbid Cataracts, Anemia, Arrhythmia, Congestive Heart Failure, Weeks Of Treatment: 0 History: Hypotension, Peripheral Venous Disease, Osteomyelitis, Clustered Wound: No Neuropathy Photos Wound Measurements Length: (cm) 3 Width: (cm) 6 Depth: (cm) 0.3 Area: (cm) 14.137 Volume: (cm) 4.241 % Reduction in Area: % Reduction in Volume: Epithelialization: Small (1-33%) Tunneling: No Undermining: Yes Starting Position (o'clock): 6 Ending Position (o'clock): 7 Maximum Distance: (cm) 1.2 Wound  Description Classification: Full Thickness Without Exposed Suppo Wound Margin: Distinct, outline attached Exudate Amount: Medium Exudate Type: Serosanguineous Covey, [name] ([id number]) Exudate Color: red, brown rt Structures Foul Odor After Cleansing: No Slough/Fibrino  Yes [phone].pdf Page 11 of 12 Wound Bed Granulation Amount: Small (1-33%) Exposed Structure Granulation Quality: Red Fascia Exposed: No Necrotic Amount: Large (67-100%) Fat Layer (Subcutaneous Tissue) Exposed: Yes Necrotic Quality: Adherent Slough Tendon Exposed: No Muscle Exposed: No Joint Exposed: No Bone Exposed: No Periwound Skin Texture Texture Color No Abnormalities Noted: Yes No Abnormalities Noted: No Ecchymosis: Yes Moisture No Abnormalities Noted: Yes Temperature / Pain Temperature: No Abnormality Tenderness on Palpation: Yes Electronic Signature(s) Signed: 06/26/2023 12:37:09 PM By: Zenaida Deed RN, BSN Entered By: Zenaida Deed on 06/25/2023 12:17:12 -------------------------------------------------------------------------------- Wound Assessment Details Patient Name: Date of Service: Megan Reap. 06/25/2023 2:45 PM Medical Record Number: [id number] Patient Account Number: [id number] Date of Birth/Sex: Treating RN: 08-08-1928 (89 y.o. [name] Primary Care Kyser Wandel: [name] Other Clinician: Referring Arius Harnois: Treating Damoney Julia/Extender: Claudie Leach Weeks in Treatment: 15 Wound Status Wound Number: 9 Primary Pressure Ulcer Etiology: Wound Location: Left Calcaneus Wound Open Wounding Event: Pressure Injury Status: Date Acquired: 10/22/2022 Comorbid Cataracts, Anemia, Arrhythmia, Congestive Heart Failure, Weeks Of Treatment: 15 History: Hypotension, Peripheral Venous Disease, Osteomyelitis, Clustered Wound: No Neuropathy Photos Wound Measurements Length: (cm) 0.3 Width: (cm) 0.3 Depth: (cm) 0.4 Area: (cm)  0.071 Volume: (cm) 0.028 % Reduction in Area: 78.5% % Reduction in Volume: 71.7% Epithelialization: Small (1-33%) Tunneling: No Undermining: No Wound Description Classification: Category/Stage II Wound Margin: Distinct, outline attached [name], [name] ([id number]) Exudate Amount: Small Exudate Type: Serous Exudate Color: amber Foul Odor After Cleansing: No Slough/Fibrino Yes [phone].pdf Page 12 of 12 Wound Bed Granulation Amount: None Present (0%) Exposed Structure Necrotic Amount: Large (67-100%) Fascia Exposed: No Necrotic Quality: Adherent Slough Fat Layer (Subcutaneous Tissue) Exposed: Yes Tendon Exposed: No Muscle Exposed: No Joint Exposed: No Bone Exposed: No Periwound Skin Texture Texture Color No Abnormalities Noted: No No Abnormalities Noted: Yes Callus: Yes Temperature / Pain Temperature: No Abnormality Moisture No Abnormalities Noted: Yes Tenderness on Palpation: Yes Electronic Signature(s) Signed: 06/26/2023 12:37:09 PM By: Zenaida Deed RN, BSN Entered By: Zenaida Deed on 06/25/2023 12:14:30 -------------------------------------------------------------------------------- Vitals Details Patient Name: Date of Service: Megan Reap. 06/25/2023 2:45 PM Medical Record Number: [id number] Patient Account Number: [id number] Date of Birth/Sex: Treating RN: 08/26/1928 (89 y.o. [name] Primary Care Javontay Vandam: [name] Other Clinician: Referring Nolon Yellin: Treating Fenix Ruppe/Extender: Claudie Leach Weeks in Treatment: 15 Vital Signs Time Taken: 14:49 Temperature (F): 97.8 Height (in): 64 Pulse (bpm): 68 Weight (lbs): 157 Respiratory Rate (breaths/min): 18 Body Mass Index (BMI): 26.9 Blood Pressure (mmHg): 104/56 Reference Range: 80 - 120 mg / dl Electronic Signature(s) Signed: 06/26/2023 12:37:09 PM By: Zenaida Deed RN, BSN Entered By: Zenaida Deed on 06/25/2023 11:49:48

## 2023-06-26 NOTE — Progress Notes (Addendum)
ABREE, CHIEFFO ([id number]) [id number] Page 1 of 17 Visit Report for 06/25/2023 Chief Complaint Document Details Patient Name: Date of Service: [name], [name] 06/25/2023 2:45 PM Medical Record Number: [id number] Patient Account Number: [id number] Date of Birth/Sex: Treating RN: 1927/09/19 (89 y.o. F) Primary Care Provider: Jorge [name] Other Clinician: Referring Provider: Treating Provider/Extender: Tiajuana Amass in Treatment: 15 Information Obtained from: Patient Chief Complaint 01/28/2022; right second toe amputation site dehiscence and bilateral lower extremity wounds. 03/10/2023: pressure ulcers of right heel, ulcers on toes Electronic Signature(s) Signed: 06/25/2023 4:28:09 PM By: Megan Guess MD FACS Entered By: [name] on 06/25/2023 13:28:08 -------------------------------------------------------------------------------- Debridement Details Patient Name: Date of Service: Megan Reap. 06/25/2023 2:45 PM Medical Record Number: [id number] Patient Account Number: [id number] Date of Birth/Sex: Treating RN: 05-30-1928 (89 y.o. [name] Primary Care Provider: Jorge [name] Other Clinician: Referring Provider: Treating Provider/Extender: Claudie Leach Weeks in Treatment: 15 Debridement Performed for Assessment: Wound #16 Left Knee Performed By: Physician Megan Guess, MD The following information was scribed by: [name] The information was scribed for: [name] Debridement Type: Debridement Level of Consciousness (Pre-procedure): Awake and Alert Pre-procedure Verification/Time Out Yes - 15:25 Taken: Start Time: 15:25 Pain Control: Lidocaine 4% Topical Solution Percent of Wound Bed Debrided: 80% T Area Debrided (cm): otal 11.3 Tissue and other material debrided: Non-Viable, Fat, Slough, Subcutaneous, Slough Level: Skin/Subcutaneous Tissue Debridement Description:  Excisional Instrument: Curette Bleeding: Minimum Hemostasis Achieved: Pressure Procedural Pain: 4 Post Procedural Pain: 2 Response to Treatment: Procedure was tolerated well Level of Consciousness (Post- Awake and Alert procedure): Post Debridement Measurements of Total Wound Length: (cm) 3 Width: (cm) 6 Depth: (cm) 0.3 [name], [name] ([id number]) [id number] Page 2 of 17 Volume: (cm) 4.241 Character of Wound/Ulcer Post Debridement: Requires Further Debridement Post Procedure Diagnosis Same as Pre-procedure Electronic Signature(s) Signed: 06/25/2023 4:49:16 PM By: Megan Guess MD FACS Signed: 06/26/2023 12:37:09 PM By: Megan Deed RN, BSN Entered By: [name] on 06/25/2023 12:32:13 -------------------------------------------------------------------------------- Debridement Details Patient Name: Date of Service: Megan Reap. 06/25/2023 2:45 PM Medical Record Number: [id number] Patient Account Number: [id number] Date of Birth/Sex: Treating RN: 04-Oct-1927 (89 y.o. [name], [name] Primary Care Provider: Jorge [name] Other Clinician: Referring Provider: Treating Provider/Extender: Claudie Leach Weeks in Treatment: 15 Debridement Performed for Assessment: Wound #13 Left,Dorsal T Great oe Performed By: Physician Megan Guess, MD Debridement Type: Debridement Severity of Tissue Pre Debridement: Necrosis of bone Level of Consciousness (Pre-procedure): Awake and Alert Pre-procedure Verification/Time Out Yes - 15:25 Taken: Start Time: 15:25 Pain Control: Lidocaine 4% T opical Solution Percent of Wound Bed Debrided: 100% T Area Debrided (cm): otal 1.38 Tissue and other material debrided: Non-Viable, Slough, Slough Level: Non-Viable Tissue Debridement Description: Selective/Open Wound Instrument: Curette Bleeding: Minimum Hemostasis Achieved: Pressure Procedural Pain: 4 Post Procedural Pain: 2 Response to  Treatment: Procedure was tolerated well Level of Consciousness (Post- Awake and Alert procedure): Post Debridement Measurements of Total Wound Length: (cm) 1.1 Width: (cm) 1.6 Depth: (cm) 0.2 Volume: (cm) 0.276 Character of Wound/Ulcer Post Debridement: Stable Severity of Tissue Post Debridement: Necrosis of bone Post Procedure Diagnosis Same as Pre-procedure Electronic Signature(s) Signed: 06/25/2023 4:49:16 PM By: Megan Guess MD FACS Signed: 06/26/2023 12:37:09 PM By: Megan Deed RN, BSN Entered By: [name] on 06/25/2023 12:33:13 [name] ([id number]) [id number] Page 3 of 17 -------------------------------------------------------------------------------- Debridement Details Patient Name: Date of Service: [name], [name] 06/25/2023 2:45 PM Medical Record Number: [id number] Patient Account  Number: [id number] Date of Birth/Sex: Treating RN: 16-Sep-1927 (89 y.o. [name] Primary Care Provider: Jorge [name] Other Clinician: Referring Provider: Treating Provider/Extender: Claudie Leach Weeks in Treatment: 15 Debridement Performed for Assessment: Wound #11 Right T Second oe Performed By: Physician Megan Guess, MD The following information was scribed by: [name] The information was scribed for: [name] Debridement Type: Debridement Level of Consciousness (Pre-procedure): Awake and Alert Pre-procedure Verification/Time Out Yes - 15:25 Taken: Start Time: 15:25 Pain Control: Lidocaine 4% T opical Solution Percent of Wound Bed Debrided: 100% T Area Debrided (cm): otal 0.31 Tissue and other material debrided: Non-Viable, Slough, Slough Level: Non-Viable Tissue Debridement Description: Selective/Open Wound Instrument: Curette Bleeding: Minimum Hemostasis Achieved: Pressure Procedural Pain: 4 Post Procedural Pain: 2 Response to Treatment: Procedure was tolerated well Level of  Consciousness (Post- Awake and Alert procedure): Post Debridement Measurements of Total Wound Length: (cm) 0.5 Stage: Category/Stage IV Width: (cm) 0.8 Depth: (cm) 0.1 Volume: (cm) 0.031 Character of Wound/Ulcer Post Debridement: Stable Post Procedure Diagnosis Same as Pre-procedure Electronic Signature(s) Signed: 06/25/2023 4:49:16 PM By: Megan Guess MD FACS Signed: 06/26/2023 12:37:09 PM By: Megan Deed RN, BSN Entered By: [name] on 06/25/2023 12:34:12 -------------------------------------------------------------------------------- Debridement Details Patient Name: Date of Service: Megan Reap. 06/25/2023 2:45 PM Medical Record Number: [id number] Patient Account Number: [id number] Date of Birth/Sex: Treating RN: 1928-08-04 (89 y.o. [name] Primary Care Provider: Jorge [name] Other Clinician: Referring Provider: Treating Provider/Extender: Claudie Leach Weeks in Treatment: 15 Debridement Performed for Assessment: Wound #10 Left T Second oe Performed By: Physician Megan Guess, MD The following information was scribed by: [name] The information was scribed for: [name] Debridement Type: Debridement Level of Consciousness (Pre-procedure): Awake and Alert Pre-procedure Verification/Time Out Yes - 15:25 Taken: Start Time: 15:25 Pain Control: Lidocaine 4% Topical Solution Percent of Wound Bed Debrided: 100% T Area Debrided (cm): otal 0.25 [name], [name] ([id number]) [id number] Page 4 of 17 Tissue and other material debrided: Non-Viable, Slough, Slough Level: Non-Viable Tissue Debridement Description: Selective/Open Wound Instrument: Curette Bleeding: Minimum Hemostasis Achieved: Pressure Procedural Pain: 4 Post Procedural Pain: 2 Response to Treatment: Procedure was tolerated well Level of Consciousness (Post- Awake and Alert procedure): Post Debridement Measurements of  Total Wound Length: (cm) 0.4 Stage: Category/Stage IV Width: (cm) 0.8 Depth: (cm) 0.1 Volume: (cm) 0.025 Character of Wound/Ulcer Post Debridement: Stable Post Procedure Diagnosis Same as Pre-procedure Electronic Signature(s) Signed: 06/25/2023 4:49:16 PM By: Megan Guess MD FACS Signed: 06/26/2023 12:37:09 PM By: Megan Deed RN, BSN Entered By: [name] on 06/25/2023 12:37:07 -------------------------------------------------------------------------------- Debridement Details Patient Name: Date of Service: Megan Reap. 06/25/2023 2:45 PM Medical Record Number: [id number] Patient Account Number: [id number] Date of Birth/Sex: Treating RN: 14-Dec-1927 (89 y.o. F) Primary Care Provider: Jorge [name] Other Clinician: Referring Provider: Treating Provider/Extender: Claudie Leach Weeks in Treatment: 15 Debridement Performed for Assessment: Wound #9 Left Calcaneus Performed By: Physician Megan Guess, MD The following information was scribed by: [name] The information was scribed for: [name] Debridement Type: Debridement Level of Consciousness (Pre-procedure): Awake and Alert Pre-procedure Verification/Time Out Yes - 15:25 Taken: Start Time: 15:25 Pain Control: Lidocaine 4% T opical Solution Percent of Wound Bed Debrided: 100% T Area Debrided (cm): otal 0.07 Tissue and other material debrided: Non-Viable, Slough, Subcutaneous, Slough Level: Skin/Subcutaneous Tissue Debridement Description: Excisional Instrument: Curette Bleeding: Minimum Hemostasis Achieved: Pressure Procedural Pain: 4 Post Procedural Pain: 2 Response to Treatment: Procedure was tolerated well Level of  Consciousness (Post- Awake and Alert procedure): Post Debridement Measurements of Total Wound Length: (cm) 0.3 Stage: Category/Stage III Width: (cm) 0.3 Depth: (cm) 0.4 Volume: (cm) 0.028 Character of Wound/Ulcer Post Debridement: Stable Post  Procedure Diagnosis [name], [name] ([id number]) [id number] Page 5 of 17 Same as Pre-procedure Notes 07/28/2023: Late entry for date of service 06/25/2023: The procedure note was amended to reflect the accurate postdebridement stage of the wound (stage III). Note: The amendment was made greater than 24 to 48 hours after the original date of service. Electronic Signature(s) Signed: 07/28/2023 2:47:51 PM By: Megan Guess MD FACS Previous Signature: 07/28/2023 2:46:16 PM Version By: Megan Guess MD FACS Previous Signature: 06/25/2023 4:49:16 PM Version By: Megan Guess MD FACS Previous Signature: 06/26/2023 12:37:09 PM Version By: Megan Deed RN, BSN Entered By: [name] on 07/28/2023 11:47:51 -------------------------------------------------------------------------------- HPI Details Patient Name: Date of Service: Megan Reap. 06/25/2023 2:45 PM Medical Record Number: [id number] Patient Account Number: [id number] Date of Birth/Sex: Treating RN: 1928-08-28 (89 y.o. F) Primary Care Provider: Jorge [name] Other Clinician: Referring Provider: Treating Provider/Extender: Claudie Leach Weeks in Treatment: 15 History of Present Illness HPI Description: Admission 01/28/2022 Ms. 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 She states she has tried Medihoney and silver alginate in the past to this area with little benefit. She also has 2 small areas limited to skin breakdown to her lower extremities bilaterally. She has chronic venous insufficiency but not has not been wearing her compression stockings. She states she bumped her legs against an object and  not so the wound started. She has been using Medihoney to the sites. She denies signs of infection. 6/2; patient presents for follow-up. She had an x-ray of her right foot done at last clinic visit and this was negative for evidence of osteomyelitis. She also had a wound culture done that showed extra high levels of Staph aureus. I recommended Keystone antibiotics for this and this was ordered. She had ABIs with TBI's done as well that showed monophasic waveforms to the right foot with TBI of 0 and an ABI of 0.52. Urgent referral was made to vein and vascular and she saw Dr. Durwin Nora on 6/1, yesterday and he recommended an arteriogram. This is scheduled for 6/16. Patient also reports a new wound to the right great toe. This is a blister that has ruptured. She also reports increased erythema to the toe. 6/6; the patient was worked in urgently today at the insistence of her daughter out of concern for a new wound on the lateral part of the plantar right great toe. She has her original postsurgical wound after the amputation of the right second toe, she has a wound on the medial part of the right great toe. The patient is apparently going for an angiogram by Dr. Durwin Nora in 2 weeks time. 6/13; patient presents for follow-up. She has been using bacitracin to the abrasion on the right great toe. She has been using collagen and Keystone antibiotic to the amputation site. She has no issues or complaints today. She denies signs of infection. 6/22; patient presents for follow-up. She states that her abdominal aortogram was canceled due to her renal function. She has been using Keystone antibiotics to the  amputation site and Medihoney to the right great toe wound. At the pace of the right great toe she has a slitlike open area that she thinks was caused by the tape from the dressing. 6/29; patient presents for follow-up. She has been using Keystone antibiotics to the amputation site along with collagen. She has been  using Medihoney to the right great toe wound. She has no other wounds. She denies signs of infection. 7/13; patient presents for follow-up. She has been using Keystone antibiotics and collagen to the wound sites. She currently denies signs of infection. She states she is scheduled to see me nephrology next month. 7/20; patient presents for follow-up. She continue Keystone antibiotics and collagen to the wound sites. She has no issues or complaints today. 8/1; patient presents for follow up. She continues to use keystone antibiotics and collagen to the wound sites. She has no issues or complaints today. 8/15; patient presents for follow-up. She has been using Keystone antibiotics and collagen to the wound sites. She followed up with her nephrologist who made medication changes. She is supposed to get a repeat BMP in 2 weeks. Her decrease in renal function was a limiting factor in obtaining an arteriogram for potential intervention for revascularization. She currently denies signs of infection. 9/2; patient presents for follow-up. She has been using Keystone antibiotic and collagen to the wound beds. She has no issues or complaints today. Reports there has been improvement in kidney function however not cleared to have her arteriogram just yet. 10/10; patient presents for follow-up. She has been using Keystone antibiotic and collagen to the wound beds. She reports 2 new wounds 1 to the anterior right lower extremity and another to the plantar aspect of the right foot. She states that the right plantar foot wound was caused by the home health nurse changing the dressing and causing a skin tear. She is not sure how the right anterior leg wound started. It appears to be from trauma. She denies signs of infection. 10/19; patient presents for follow-up. She has been using Keystone antibiotic and collagen to the right great toe wound. She is been using silver alginate to the right anterior and right plantar  foot wound. She has been using Tubigrip to the right lower extremity. The plantar foot wound has healed. She has no issues or complaints today. 11/3; since the patient was last here she was seen in urgent care apparently for an area on the dorsal aspect of the right fifth toe perhaps over the PIP. I saw a BURNA, HEUTON ([id number]) [id number] Page 6 of 17 picture of this on the daughter's cell phone. There was slough on this. Urgent care gave him doxycycline. She is also changed the dressing to all wounds back the [facility] and collagen which includes her right leg and left first toe 11/16; patient presents for follow-up. She has a new wound to the left knee. She states she fell. She has been using antibiotic ointment and collagen to this area. She has been using collagen and Keystone antibiotic to the right great toe wound. The anterior right leg wound is healed. She denies signs of infection. 11/30; patient presents for follow-up. The right great toe wound has healed. She has 1 remaining wound to the left knee. She has been using Hydrofera Blue and Medihoney here. 12/19; patient presents for follow-up. Her left knee wound has healed. She has no issues or complaints today. 09/30/2022 Patient's daughter called for an appointment due to increased swelling to her  lower extremities bilaterally. Today patient presents with increased swelling to her lower extremities although there is no increased redness or warmth. She was evaluated by her nephrologist who increased her diuretics. Per patient and daughter her swelling has gone down to the leg Over the past several days. She does not have any open wounds. She was advised to elevate her legs and not consume excess salt By her PCP and nephrologist. Patient has compression stockings that she has been using sporadically. READMISSION 03/10/2023 Since her last visit to the clinic, the patient has been hospitalized at least twice, in  February with COVID pneumonia and CHF exacerbation. She was discharged to a skilled nursing facility and then readmitted in April with fevers. She was noted to have pressure ulcers on her heels and sacrum. Per family declined skilled nursing placement upon discharge and she has apparently been residing at home. She has followed with the vascular surgery clinic regarding peripheral artery disease and due to ulcerations on her feet, she ultimately underwent arteriography with balloon angioplasties of the superficial femoral artery, popliteal artery and peroneal artery on the left, along with shockwave ultrasound assisted balloon angioplasty of the popliteal artery and distal SFA. Given her multiple significant medical comorbidities, she is not a candidate for open surgery. She is deemed to be maximally vascularized this procedure, which was performed on 04 March 2023. T oday, she has a stage III pressure ulcer on her left heel and wounds on the PIP joints of her right third and left second toes. No sacral wound is present and the ulcer on her left heel has closed. 03/18/2023: She has a new ulcer on her left great toe. It was just noticed yesterday and the etiology is unknown. The fat layer is exposed and there is some slough accumulation. The other wounds are all essentially unchanged in size. There is a little bit more granulation tissue on the other toe wounds, but bone is still frankly exposed on both sides. The heel ulcer has thick slough accumulation. 03/26/2023: She has a new wound on the tip of her left third toe. It is black. Neither the patient nor her daughter are aware of how it began. The other wounds are stable. 03/31/2023: The culture that I took from her left third toe returned with MRSA. We contacted the patient's daughter to have her stop levofloxacin and start doxycycline, but she has not yet picked up the new antibiotic. All of her wounds have accumulated slough. Bone remains exposed. She  is scheduled to follow- up with vascular surgery tomorrow to evaluate the patency of her revascularization. 04/09/2023: She saw her vascular surgeon and her ABIs are improved; her TBI's could not be checked due to her bandages. She did finally start taking the prescribed doxycycline. All of her wounds look about the same today. 04/23/2023: No significant changes to any of her wounds aside from being slightly smaller other than the left dorsal great toe wound, which is stable. She has accumulated slough and eschar on all of the surfaces. 04/30/2023: All of the wounds are stable with the exception of the left dorsal great toe wound which now has tendon exposed in addition to bone. There is slough and eschar accumulation on all sites. 05/15/2023: The patient was in the hospital and missed her appointment last week. The wound at the tip of her left third toe is healed. Everything else looks the same. 05/21/2023: No real change to any of the wounds. 05/28/2023: Once again, there has been no real  change to any of her wounds except that the wound at the tip of her left great toe is closed. 06/11/2019: No change to any of her wounds. 06/25/2023: The wound on her right great toe nailbed has healed. The other wounds on her toes are unchanged and continue to have bone/joints exposed. The ulcer on her heel is smaller and shallower with very little necrotic tissue present. She fell last week and split her knee open. Apparently she presented to the ED greater than 12 hours after injury and they attempted to loosely suture the site. The sutures have pulled through her skin and they are doing absolutely nothing at this point. The wound is full of nonviable tissue. The muscle fascia is visible. Her PCP has her on doxycycline. Electronic Signature(s) Signed: 06/25/2023 4:31:12 PM By: Megan Guess MD FACS Entered By: [name] on 06/25/2023  13:31:12 -------------------------------------------------------------------------------- Physical Exam Details Patient Name: Date of Service: Megan Reap. 06/25/2023 2:45 PM Medical Record Number: [id number] Patient Account Number: [id number] Date of Birth/Sex: Treating RN: Feb 10, 1928 (89 y.o. F) Primary Care Provider: Jorge [name] Other Clinician: Referring Provider: Treating Provider/Extender: Merceda Elks, Tereso Newcomer Weeks in Treatment: 15 Constitutional . . . . no acute distress. TENNIA, SCHNEPPER ([id number]) [id number] Page 7 of 17 Respiratory Normal work of breathing on room air. Notes 06/25/2023: The wound on her right great toe nailbed has healed. The other wounds on her toes are unchanged and continue to have bone/joints exposed. The ulcer on her heel is smaller and shallower with very little necrotic tissue present. She fell last week and split her knee open. The wound is full of nonviable tissue. The muscle fascia is visible. Electronic Signature(s) Signed: 06/25/2023 4:33:41 PM By: Megan Guess MD FACS Entered By: [name] on 06/25/2023 13:33:41 -------------------------------------------------------------------------------- Physician Orders Details Patient Name: Date of Service: Megan Reap. 06/25/2023 2:45 PM Medical Record Number: [id number] Patient Account Number: [id number] Date of Birth/Sex: Treating RN: 1928/06/27 (89 y.o. 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 - Dr. Lady Gary - room 1 Return A Anesthetic (In clinic) Topical Lidocaine 4% applied to wound bed Bathing/ Shower/ Hygiene May shower and wash wound with soap and water. Edema Control - Lymphedema / SCD / Other Elevate legs to the level of the heart or above for 30 minutes daily and/or when sitting for 3-4 times a day throughout the day. Avoid standing for long periods of time. Patient to wear own compression stockings every day. Moisturize legs daily. - and feet Additional Orders / Instructions Follow Nutritious Diet - add in protein shakes every day to diet - recommend premier protein 500 mg x3 a day vitamin C, zinc 30-50 mg per day Home Health No change in wound care orders this week; continue Home  Health for wound care. May utilize formulary equivalent dressing for wound treatment orders unless otherwise specified. Other Home Health Orders/Instructions: - Medi Home Wound Treatment Wound #10 - T Second oe Wound Laterality: Left Cleanser: Soap and Water 1 x Per Day/30 Days Discharge Instructions: May shower and wash wound with dial antibacterial soap and water prior to dressing change. SHARAY, BOAST ([id number]) [id number] Page 8 of 17 Cleanser: Wound Cleanser 1 x Per Day/30 Days Discharge Instructions: Cleanse the wound with wound cleanser prior to applying a clean dressing using gauze sponges, not tissue or cotton balls. Peri-Wound Care: Sween Lotion (Moisturizing lotion) 1 x Per Day/30  Days Discharge Instructions: Apply moisturizing lotion as directed Prim Dressing: Promogran Prisma Matrix, 4.34 (sq in) (silver collagen) (DME) (Dispense As Written) 1 x Per Day/30 Days ary Discharge Instructions: Moisten collagen with saline or hydrogel Secondary Dressing: Woven Gauze Sponge, Non-Sterile 4x4 in (DME) (Generic) 1 x Per Day/30 Days Discharge Instructions: Apply over primary dressing as directed. Secured With: [employer], 4.5x3.1 (in/yd) (DME) (Generic) 1 x Per Day/30 Days Discharge Instructions: Secure with Kerlix as directed. Secured With: 67M Medipore H Soft Cloth Surgical T ape, 4 x 10 (in/yd) (DME) (Generic) 1 x Per Day/30 Days Discharge Instructions: Secure with tape as directed. Wound #11 - T Second oe Wound Laterality: Right Cleanser: Soap and Water 1 x Per Day/30 Days Discharge Instructions: May shower and wash wound with dial antibacterial soap and water prior to dressing change. Cleanser: Wound Cleanser 1 x Per Day/30 Days Discharge Instructions: Cleanse the wound with wound cleanser prior to applying a clean dressing using gauze sponges, not tissue or cotton balls. Prim Dressing: Promogran Prisma Matrix, 4.34 (sq in) (silver collagen) (DME) (Dispense As Written) 1 x Per Day/30 Days ary Discharge Instructions: Moisten collagen with saline or hydrogel Secondary Dressing: Woven Gauze Sponge, Non-Sterile 4x4 in (DME) (Generic) 1 x Per Day/30 Days Discharge Instructions: Apply over primary dressing as directed. Secured With: Insurance underwriter, Sterile 2x75 (in/in) (DME) (Generic) 1 x Per Day/30 Days Discharge Instructions: Secure with stretch gauze as directed. Secured With: 67M Medipore H Soft Cloth Surgical T ape, 4 x 10 (in/yd) (DME) (Generic) 1 x Per Day/30 Days Discharge Instructions: Secure with tape as directed. Wound #13 - T Great oe Wound Laterality: Dorsal, Left Cleanser: Soap and Water 1 x Per Day/30 Days Discharge Instructions: May  shower and wash wound with dial antibacterial soap and water prior to dressing change. Cleanser: Wound Cleanser 1 x Per Day/30 Days Discharge Instructions: Cleanse the wound with wound cleanser prior to applying a clean dressing using gauze sponges, not tissue or cotton balls. Prim Dressing: Promogran Prisma Matrix, 4.34 (sq in) (silver collagen) (DME) (Dispense As Written) 1 x Per Day/30 Days ary Discharge Instructions: Moisten collagen with saline or hydrogel Secondary Dressing: Woven Gauze Sponge, Non-Sterile 4x4 in (DME) (Generic) 1 x Per Day/30 Days Discharge Instructions: Apply over primary dressing as directed. Secured With: 67M Medipore H Soft Cloth Surgical T ape, 4 x 10 (in/yd) (DME) (Generic) 1 x Per Day/30 Days Discharge Instructions: Secure with tape as directed. Wound #16 - Knee Wound Laterality: Left Cleanser: Normal Saline (DME) (Generic) 3 x Per Week/30 Days Discharge Instructions: Cleanse the wound with Normal Saline prior to applying a clean dressing using gauze sponges, not tissue or cotton balls. Prim Dressing: Hydrofera Blue Classic Foam, 4x4 in (DME) (Dispense As Written) 3 x Per Week/30 Days ary Discharge Instructions: Moisten with saline prior to applying to wound bed  Secondary Dressing: ABD Pad, 8x10 (DME) (Generic) 3 x Per Week/30 Days Discharge Instructions: Apply over primary dressing as directed. Secured With: [employer], 4.5x3.1 (in/yd) (DME) (Generic) 3 x Per Week/30 Days Discharge Instructions: Secure with Kerlix as directed. Secured With: Transpore Surgical Tape, 2x10 (in/yd) 3 x Per Week/30 Days Discharge Instructions: Secure dressing with tape as directed. Wound #9 - Calcaneus Wound Laterality: Left Cleanser: Soap and Water 1 x Per Day/30 Days Discharge Instructions: May shower and wash wound with dial antibacterial soap and water prior to dressing change. Cleanser: Wound Cleanser 1 x Per Day/30 Days Discharge Instructions: Cleanse the wound with  wound cleanser prior to applying a clean dressing using gauze sponges, not tissue or cotton balls. PRESLEI, FREDENBERG ([id number]) [id number] Page 9 of 17 Prim Dressing: Promogran Prisma Matrix, 4.34 (sq in) (silver collagen) (DME) (Dispense As Written) 1 x Per Day/30 Days ary Discharge Instructions: Moisten collagen with saline or hydrogel Secondary Dressing: ALLEVYN Heel 4 1/2in x 5 1/2in / 10.5cm x 13.5cm (DME) (Generic) 1 x Per Day/30 Days Discharge Instructions: Apply over primary dressing as directed. Secondary Dressing: Woven Gauze Sponge, Non-Sterile 4x4 in (DME) (Generic) 1 x Per Day/30 Days Discharge Instructions: Apply over primary dressing as directed. Secured With: [employer], 4.5x3.1 (in/yd) (DME) (Generic) 1 x Per Day/30 Days Discharge Instructions: Secure with Kerlix as directed. Secured With: 59M Medipore H Soft Cloth Surgical T ape, 4 x 10 (in/yd) (DME) (Generic) 1 x Per Day/30 Days Discharge Instructions: Secure with tape as directed. Electronic Signature(s) Signed: 07/01/2023 7:45:46 AM By: Megan Guess MD FACS Signed: 07/01/2023 5:45:00 PM By: Megan Deed RN, BSN Previous Signature: 06/25/2023 4:49:16 PM Version By: Megan Guess MD FACS Entered By: [name] on 06/30/2023 13:31:55 -------------------------------------------------------------------------------- Problem List Details Patient Name: Date of Service: Megan Reap. 06/25/2023 2:45 PM Medical Record Number: [id number] Patient Account Number: [id number] Date of Birth/Sex: Treating RN: 01-Jan-1928 (89 y.o. 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 03/10/2023 No Yes without evidence of necrosis 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 03/10/2023 No Yes without evidence of necrosis L97.825 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 06/25/2023 No Yes involvement without evidence of necrosis I73.9 Peripheral vascular disease, unspecified 03/10/2023 No Yes I50.32 Chronic diastolic (congestive) heart failure 03/10/2023 No Yes N18.32 Chronic kidney disease, stage 3b 03/10/2023 No Yes [name], [name] ([id number]) [id number] Page 10 of 17 I87.2 Venous insufficiency (chronic) (peripheral) 03/10/2023 No Yes Inactive Problems ICD-10 Code Description Active Date Inactive Date L97.522 Non-pressure chronic ulcer of other part of left foot with fat layer exposed 03/18/2023 03/18/2023 Resolved Problems Electronic Signature(s) Signed: 06/25/2023 4:22:20 PM By: Megan Guess MD FACS Entered By: [name] on 06/25/2023 13:22:20 -------------------------------------------------------------------------------- Progress Note Details Patient Name: Date of Service: Megan Reap. 06/25/2023 2:45 PM Medical Record Number: [id number] Patient Account Number: [id number] Date of Birth/Sex: Treating RN: 1927-11-13 (89 y.o. F) Primary Care Provider: Jorge [name] Other Clinician: Referring Provider: Treating Provider/Extender: Claudie Leach Weeks in Treatment: 15 Subjective Chief Complaint Information obtained from Patient 01/28/2022; right second toe amputation site dehiscence and bilateral lower extremity wounds. 03/10/2023: pressure ulcers of  right heel, ulcers on toes History of Present Illness (HPI) Admission 01/28/2022 Ms. 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 She states she has tried Medihoney and silver alginate in the past to this area with little benefit. She also has 2 small areas limited to skin breakdown to her lower extremities bilaterally. She has chronic venous insufficiency but not has not been wearing her compression stockings. She states she bumped her legs against an object and not so the wound started. She has been using Medihoney to the sites. She denies signs of infection. 6/2; patient presents for follow-up. She had an x-ray of her right foot done at last clinic visit and this was negative for evidence of osteomyelitis. She also had a wound culture done that showed extra high levels of Staph aureus. I recommended Keystone antibiotics for this and this was ordered. She had ABIs with TBI's done as well that showed monophasic waveforms to the right foot with TBI of 0 and an ABI of 0.52. Urgent referral was made to vein and vascular and she saw Dr. Durwin Nora on 6/1, yesterday and he recommended an arteriogram. This is scheduled for 6/16. Patient also reports a new wound to the right great toe. This is a blister that has ruptured. She also reports increased erythema to the toe. 6/6; the patient was worked in urgently today at the insistence of her daughter out of concern for a new wound on the lateral part of the plantar right great toe. She has her original postsurgical wound after the amputation of the right second toe, she has a wound on the medial part of the right great toe. The patient is apparently going for an angiogram by Dr. Durwin Nora in 2 weeks time. 6/13; patient presents for follow-up. She has been using bacitracin to the abrasion on the right great toe. She has been using collagen and Keystone antibiotic to the amputation site. She has no issues or complaints today. She denies signs of  infection. 6/22; patient presents for follow-up. She states that her abdominal aortogram was canceled due to her renal function. She has been using Keystone antibiotics to the amputation site and Medihoney to the right great toe wound. At the pace of the right great toe she has a slitlike open area that she thinks was caused by the tape from the dressing. 6/29; patient presents for follow-up. She has been using Keystone antibiotics to the amputation site along with collagen. She has been using Medihoney to the right great toe wound. She has no other wounds. She denies signs of infection. 7/13; patient presents for follow-up. She has been using Keystone antibiotics and collagen to the wound sites. She currently denies signs of infection. She states she is scheduled to see me nephrology next month. 7/20; patient presents for follow-up. She continue Keystone antibiotics and collagen to the wound sites. She has no issues or complaints today. 8/1; patient presents for follow up. She continues to use keystone antibiotics and collagen to the wound sites. She has no issues or complaints today. 8/15; patient presents for follow-up. She has been using Keystone antibiotics and collagen to the wound sites. She followed up with her nephrologist who made [name], [name] ([id number]) [id number] Page 11 of  17 medication changes. She is supposed to get a repeat BMP in 2 weeks. Her decrease in renal function was a limiting factor in obtaining an arteriogram for potential intervention for revascularization. She currently denies signs of infection. 9/2; patient presents for follow-up. She has been using Keystone antibiotic and collagen to the wound beds. She has no issues or complaints today. Reports there has been improvement in kidney function however not cleared to have her arteriogram just yet. 10/10; patient presents for follow-up. She has been using Keystone antibiotic and collagen to  the wound beds. She reports 2 new wounds 1 to the anterior right lower extremity and another to the plantar aspect of the right foot. She states that the right plantar foot wound was caused by the home health nurse changing the dressing and causing a skin tear. She is not sure how the right anterior leg wound started. It appears to be from trauma. She denies signs of infection. 10/19; patient presents for follow-up. She has been using Keystone antibiotic and collagen to the right great toe wound. She is been using silver alginate to the right anterior and right plantar foot wound. She has been using Tubigrip to the right lower extremity. The plantar foot wound has healed. She has no issues or complaints today. 11/3; since the patient was last here she was seen in urgent care apparently for an area on the dorsal aspect of the right fifth toe perhaps over the PIP. I saw a picture of this on the daughter's cell phone. There was slough on this. Urgent care gave him doxycycline. She is also changed the dressing to all wounds back the [facility] and collagen which includes her right leg and left first toe 11/16; patient presents for follow-up. She has a new wound to the left knee. She states she fell. She has been using antibiotic ointment and collagen to this area. She has been using collagen and Keystone antibiotic to the right great toe wound. The anterior right leg wound is healed. She denies signs of infection. 11/30; patient presents for follow-up. The right great toe wound has healed. She has 1 remaining wound to the left knee. She has been using Hydrofera Blue and Medihoney here. 12/19; patient presents for follow-up. Her left knee wound has healed. She has no issues or complaints today. 09/30/2022 Patient's daughter called for an appointment due to increased swelling to her lower extremities bilaterally. Today patient presents with increased swelling to her lower extremities although there is no  increased redness or warmth. She was evaluated by her nephrologist who increased her diuretics. Per patient and daughter her swelling has gone down to the leg Over the past several days. She does not have any open wounds. She was advised to elevate her legs and not consume excess salt By her PCP and nephrologist. Patient has compression stockings that she has been using sporadically. READMISSION 03/10/2023 Since her last visit to the clinic, the patient has been hospitalized at least twice, in February with COVID pneumonia and CHF exacerbation. She was discharged to a skilled nursing facility and then readmitted in April with fevers. She was noted to have pressure ulcers on her heels and sacrum. Per family declined skilled nursing placement upon discharge and she has apparently been residing at home. She has followed with the vascular surgery clinic regarding peripheral artery disease and due to ulcerations on her feet, she ultimately underwent arteriography with balloon angioplasties of the superficial femoral artery, popliteal artery and peroneal artery on the  left, along with shockwave ultrasound assisted balloon angioplasty of the popliteal artery and distal SFA. Given her multiple significant medical comorbidities, she is not a candidate for open surgery. She is deemed to be maximally vascularized this procedure, which was performed on 04 March 2023. T oday, she has a stage III pressure ulcer on her left heel and wounds on the PIP joints of her right third and left second toes. No sacral wound is present and the ulcer on her left heel has closed. 03/18/2023: She has a new ulcer on her left great toe. It was just noticed yesterday and the etiology is unknown. The fat layer is exposed and there is some slough accumulation. The other wounds are all essentially unchanged in size. There is a little bit more granulation tissue on the other toe wounds, but bone is still frankly exposed on both sides. The  heel ulcer has thick slough accumulation. 03/26/2023: She has a new wound on the tip of her left third toe. It is black. Neither the patient nor her daughter are aware of how it began. The other wounds are stable. 03/31/2023: The culture that I took from her left third toe returned with MRSA. We contacted the patient's daughter to have her stop levofloxacin and start doxycycline, but she has not yet picked up the new antibiotic. All of her wounds have accumulated slough. Bone remains exposed. She is scheduled to follow- up with vascular surgery tomorrow to evaluate the patency of her revascularization. 04/09/2023: She saw her vascular surgeon and her ABIs are improved; her TBI's could not be checked due to her bandages. She did finally start taking the prescribed doxycycline. All of her wounds look about the same today. 04/23/2023: No significant changes to any of her wounds aside from being slightly smaller other than the left dorsal great toe wound, which is stable. She has accumulated slough and eschar on all of the surfaces. 04/30/2023: All of the wounds are stable with the exception of the left dorsal great toe wound which now has tendon exposed in addition to bone. There is slough and eschar accumulation on all sites. 05/15/2023: The patient was in the hospital and missed her appointment last week. The wound at the tip of her left third toe is healed. Everything else looks the same. 05/21/2023: No real change to any of the wounds. 05/28/2023: Once again, there has been no real change to any of her wounds except that the wound at the tip of her left great toe is closed. 06/11/2019: No change to any of her wounds. 06/25/2023: The wound on her right great toe nailbed has healed. The other wounds on her toes are unchanged and continue to have bone/joints exposed. The ulcer on her heel is smaller and shallower with very little necrotic tissue present. She fell last week and split her knee open. Apparently  she presented to the ED greater than 12 hours after injury and they attempted to loosely suture the site. The sutures have pulled through her skin and they are doing absolutely nothing at this point. The wound is full of nonviable tissue. The muscle fascia is visible. Her PCP has her on doxycycline. Patient History Information obtained from Patient, Caregiver, Chart. Family History Cancer - Siblings, Heart Disease, Stroke - Mother, No family history of Diabetes. Social History Former smoker - quit 2003, Marital Status - Widowed, Alcohol Use - Never, Drug Use - No History, Caffeine Use - Never. Medical History Eyes Patient has history of Cataracts Hematologic/Lymphatic Budzik,  [name] ([id number]) [id number] Page 12 of 17 Patient has history of Anemia Respiratory Denies history of Chronic Obstructive Pulmonary Disease (COPD) Cardiovascular Patient has history of Arrhythmia - a-fib, Congestive Heart Failure, Hypotension, Peripheral Venous Disease Musculoskeletal Patient has history of 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 Neurologic Patient has history of Neuropathy Hospitalization/Surgery History - Abdominal aortogram w/lower extremity. - Amputation toe (Right). - Colonoscopy. - Elbow surgery. - Laparoscopic hysterectomy. Medical A Surgical History Notes nd Hematologic/Lymphatic Thrombocytopenia, Anticoagulant long-term use Respiratory Pulmonary embolus Gastrointestinal Hiatal hernia Endocrine Hyperthyroidism, Hypothyroidism Genitourinary CKD stage III Musculoskeletal arthritis Objective Constitutional no acute distress. Vitals Time Taken: 2:49 PM, Height: 64 in, Weight: 157 lbs, BMI: 26.9, Temperature: 97.8 F, Pulse: 68 bpm, Respiratory Rate: 18 breaths/min, Blood Pressure: 104/56 mmHg. Respiratory Normal work of breathing on room air. General Notes: 06/25/2023: The wound on her right great toe nailbed has healed. The other wounds on  her toes are unchanged and continue to have bone/joints exposed. The ulcer on her heel is smaller and shallower with very little necrotic tissue present. She fell last week and split her knee open. The wound is full of nonviable tissue. The muscle fascia is visible. Integumentary (Hair, Skin) Wound #10 status is Open. Original cause of wound was Pressure Injury. The date acquired was: 10/09/2022. The wound has been in treatment 15 weeks. The wound is located on the Left T Second. The wound measures 0.4cm length x 0.8cm width x 0.1cm depth; 0.251cm^2 area and 0.025cm^3 volume. There is oe bone, joint, and Fat Layer (Subcutaneous Tissue) exposed. There is no tunneling or undermining noted. There is a medium amount of serous drainage noted. The wound margin is distinct with the outline attached to the wound base. There is medium (34-66%) red granulation within the wound bed. There is a small (1- 33%) amount of necrotic tissue within the wound bed including Adherent Slough. The periwound skin appearance had no abnormalities noted for texture. The periwound skin appearance had no abnormalities noted for moisture. The periwound skin appearance had no abnormalities noted for color. Periwound temperature was noted as No Abnormality. The periwound has tenderness on palpation. Wound #11 status is Open. Original cause of wound was Pressure Injury. The date acquired was: 10/09/2022. The wound has been in treatment 15 weeks. The wound is located on the Right T Second. The wound measures 0.5cm length x 0.8cm width x 0.2cm depth; 0.314cm^2 area and 0.063cm^3 volume. There is oe bone, joint, and Fat Layer (Subcutaneous Tissue) exposed. There is no tunneling or undermining noted. There is a medium amount of serosanguineous drainage noted. The wound margin is distinct with the outline attached to the wound base. There is large (67-100%) pink granulation within the wound bed. There is a small (1-33%) amount of necrotic  tissue within the wound bed including Adherent Slough. The periwound skin appearance had no abnormalities noted for texture. The periwound skin appearance had no abnormalities noted for moisture. The periwound skin appearance had no abnormalities noted for color. Periwound temperature was noted as No Abnormality. The periwound has tenderness on palpation. Wound #13 status is Open. Original cause of wound was Gradually Appeared. The date acquired was: 03/24/2023. The wound has been in treatment 13 weeks. The wound is located on the Left,Dorsal T Great. The wound measures 1.1cm length x 1.6cm width x 0.2cm depth; 1.382cm^2 area and 0.276cm^3 volume. oe There is bone and Fat Layer (Subcutaneous Tissue) exposed. There is no tunneling or undermining noted. There is a medium amount of  serous drainage noted. The wound margin is distinct with the outline attached to the wound base. There is small (1-33%) red granulation within the wound bed. There is a large (67-100%) amount of necrotic tissue within the wound bed including Adherent Slough and Necrosis of Bone. The periwound skin appearance had no abnormalities noted for texture. The periwound skin appearance had no abnormalities noted for moisture. The periwound skin appearance had no abnormalities noted for color. Periwound temperature was noted as No Abnormality. The periwound has tenderness on palpation. Wound #15 status is Healed - Epithelialized. Original cause of wound was Gradually Appeared. The date acquired was: 03/26/2023. The wound has been in treatment 13 weeks. The wound is located on the Right,Dorsal T [place]. The wound measures 0cm length x 0cm width x 0cm depth; 0cm^2 area and 0cm^3 oe volume. There is no tunneling or undermining noted. There is a none present amount of drainage noted. The wound margin is flat and intact. There is no granulation within the wound bed. There is no necrotic tissue within the wound bed. The periwound skin appearance  had no abnormalities noted for texture. The periwound skin appearance had no abnormalities noted for moisture. The periwound skin appearance had no abnormalities noted for color. Periwound temperature was noted as No Abnormality. The periwound has tenderness on palpation. Wound #16 status is Open. Original cause of wound was Trauma. The date acquired was: 06/18/2023. The wound is located on the Left Knee. The wound measures 3cm length x 6cm width x 0.3cm depth; 14.137cm^2 area and 4.241cm^3 volume. There is Fat Layer (Subcutaneous Tissue) exposed. There is no tunneling noted, however, there is undermining starting at 6:00 and ending at 7:00 with a maximum distance of 1.2cm. There is a medium amount of serosanguineous drainage noted. The wound margin is distinct with the outline attached to the wound base. There is small (1-33%) red granulation within the wound bed. There is a large (67-100%) amount of necrotic tissue within the wound bed including Adherent Slough. The periwound skin appearance had no [name], [name] ([id number]) [id number] Page 13 of 17 abnormalities noted for texture. The periwound skin appearance had no abnormalities noted for moisture. The periwound skin appearance exhibited: Ecchymosis. Periwound temperature was noted as No Abnormality. The periwound has tenderness on palpation. Wound #9 status is Open. Original cause of wound was Pressure Injury. The date acquired was: 10/22/2022. The wound has been in treatment 15 weeks. The wound is located on the Left Calcaneus. The wound measures 0.3cm length x 0.3cm width x 0.4cm depth; 0.071cm^2 area and 0.028cm^3 volume. There is Fat Layer (Subcutaneous Tissue) exposed. There is no tunneling or undermining noted. There is a small amount of serous drainage noted. The wound margin is distinct with the outline attached to the wound base. There is no granulation within the wound bed. There is a large (67-100%) amount  of necrotic tissue within the wound bed including Adherent Slough. The periwound skin appearance had no abnormalities noted for moisture. The periwound skin appearance had no abnormalities noted for color. The periwound skin appearance exhibited: Callus. Periwound temperature was noted as No Abnormality. The periwound has tenderness on palpation. 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 There was a Selective/Open Wound Non-Viable Tissue oe Debridement with a total area of 0.25 sq cm performed by Megan Guess, MD. With the following instrument(s): Curette to remove Non-Viable tissue/material. Material removed includes [facility] after achieving pain control using Lidocaine 4% Topical Solution. No specimens were taken. A time out was conducted at 15:25, prior to the start of the procedure. A Minimum amount of bleeding was controlled with Pressure. The procedure was tolerated well with a pain level of 4 throughout and a pain level of 2 following the procedure. Post Debridement Measurements: 0.4cm length x 0.8cm width x 0.1cm depth; 0.025cm^3 volume. Post debridement Stage noted as Category/Stage IV. Character of Wound/Ulcer Post Debridement is stable. Post procedure Diagnosis Wound #10: Same as Pre-Procedure Wound #11 Pre-procedure diagnosis of Wound #11 is a Pressure Ulcer  located on the Right T Second . There was a Selective/Open Wound Non-Viable Tissue oe Debridement with a total area of 0.31 sq cm performed by Megan Guess, MD. With the following instrument(s): Curette to remove Non-Viable tissue/material. Material removed includes [facility] after achieving pain control using Lidocaine 4% Topical Solution. No specimens were taken. A time out was conducted at 15:25, prior to the start of the procedure. A Minimum amount of bleeding was controlled with Pressure. The procedure was tolerated well with a pain level of 4 throughout and a pain level of 2 following the procedure. Post Debridement Measurements: 0.5cm length x 0.8cm width x 0.1cm depth; 0.031cm^3 volume. Post debridement Stage noted as Category/Stage IV. Character of Wound/Ulcer Post Debridement is stable. 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 There was a Selective/Open Wound Non-Viable Tissue Debridement with a total area of 1.38 sq cm performed by Megan Guess, MD. With the following instrument(s): Curette to remove Non-Viable tissue/material. Material removed includes [facility] after achieving pain control using Lidocaine 4% Topical Solution. No specimens were taken. A time out was conducted at 15:25, prior to the start of the procedure. A Minimum amount of bleeding was controlled with Pressure. The procedure was tolerated well with a pain level of 4 throughout and a pain level of 2 following the procedure. Post Debridement Measurements: 1.1cm length x 1.6cm width x 0.2cm depth; 0.276cm^3 volume. Character of Wound/Ulcer Post Debridement is stable. Severity of Tissue Post Debridement is: Necrosis of bone. Post procedure Diagnosis Wound #13: Same as Pre-Procedure Wound #16 Pre-procedure diagnosis of Wound  #16 is an Abrasion located on the Left Knee . There was a Excisional Skin/Subcutaneous Tissue Debridement with a total area of 11.3 sq cm performed by Megan Guess, MD. With the following instrument(s): Curette to remove Non-Viable tissue/material. Material removed includes Fat, Subcutaneous Tissue, and Slough after achieving pain control using Lidocaine 4% T opical Solution. No specimens were taken. A time out was conducted at 15:25, prior to the start of the procedure. A Minimum amount of bleeding was controlled with Pressure. The procedure was tolerated well with a pain level of 4 throughout and a pain level of 2 following the procedure. Post Debridement Measurements: 3cm length x 6cm width x 0.3cm depth; 4.241cm^3 volume. Character of Wound/Ulcer Post Debridement requires further debridement. Post  procedure Diagnosis Wound #16: Same as Pre-Procedure Wound #9 Pre-procedure diagnosis of Wound #9 is a Pressure Ulcer located on the Left Calcaneus . There was a Excisional Skin/Subcutaneous Tissue Debridement with a total area of 0.07 sq cm performed by Megan Guess, MD. With the following instrument(s): Curette to remove Non-Viable tissue/material. Material removed includes Subcutaneous Tissue and Slough and after achieving pain control using Lidocaine 4% T opical Solution. No specimens were taken. A time out was conducted at 15:25, prior to the start of the procedure. A Minimum amount of bleeding was controlled with Pressure. The procedure was tolerated well with a pain level of 4 throughout and a pain level of 2 following the procedure. Post Debridement Measurements: 0.3cm length x 0.3cm width x 0.4cm depth; 0.028cm^3 volume. Post debridement Stage noted as Category/Stage III. Character of Wound/Ulcer Post Debridement is stable. Post procedure Diagnosis Wound #9: Same as Pre-Procedure General Notes: 07/28/2023: Late entry for date of service 06/25/2023: The procedure note was amended to  reflect the accurate postdebridement stage of the wound (stage III). Note: The amendment was made greater than 24 to 48 hours after the original date of service.[name] Kitchen [name], [name] ([id number]) [id number] Page 14 of 17 Plan Follow-up Appointments: Return Appointment in 2 weeks. - Dr. Lady Gary - room 1 Anesthetic: (In clinic) Topical Lidocaine 4% applied to wound bed Bathing/ Shower/ Hygiene: May shower and wash wound with soap and water. Edema Control - Lymphedema / SCD / Other: Elevate legs to the level of the heart or above for 30 minutes daily and/or when sitting for 3-4 times a day throughout the day. Avoid standing for long periods of time. Patient to wear own compression stockings every day. Moisturize legs daily. - and feet Additional Orders / Instructions: Follow Nutritious Diet - add in protein shakes every day to diet - recommend premier protein 500 mg x3 a day vitamin C, zinc 30-50 mg per day Home Health: No change in wound care orders this week; continue Home Health for wound care. May utilize formulary equivalent dressing for wound treatment orders unless otherwise specified. Other Home Health Orders/Instructions: - Medi Home WOUND #10: - T Second Wound Laterality: Left oe Cleanser: Soap and Water 1 x Per Day/30 Days Discharge Instructions: May shower and wash wound with dial antibacterial soap and water prior to dressing change. Cleanser: Wound Cleanser 1 x Per Day/30 Days Discharge Instructions: Cleanse the wound with wound cleanser prior to applying a clean dressing using gauze sponges, not tissue or cotton balls. Peri-Wound Care: Sween Lotion (Moisturizing lotion) 1 x Per Day/30 Days Discharge Instructions: Apply moisturizing lotion as directed Prim Dressing: Promogran Prisma Matrix, 4.34 (sq in) (silver collagen) (DME) (Dispense As Written) 1 x Per Day/30 Days ary Discharge Instructions: Moisten collagen with saline or hydrogel Secondary  Dressing: Woven Gauze Sponge, Non-Sterile 4x4 in (DME) (Generic) 1 x Per Day/30 Days Discharge Instructions: Apply over primary dressing as directed. Secured With: [employer], 4.5x3.1 (in/yd) (DME) (Generic) 1 x Per Day/30 Days Discharge Instructions: Secure with Kerlix as directed. Secured With: 1M Medipore H Soft Cloth Surgical T ape, 4 x 10 (in/yd) (DME) (Generic) 1 x Per Day/30 Days Discharge Instructions: Secure with tape as directed. WOUND #11: - T Second Wound Laterality: Right oe Cleanser: Soap and Water 1 x Per Day/30 Days Discharge Instructions: May shower and wash wound with dial antibacterial soap and water prior to dressing change. Cleanser: Wound Cleanser 1 x Per Day/30 Days Discharge Instructions: Cleanse the wound with wound cleanser prior  to applying a clean dressing using gauze sponges, not tissue or cotton balls. Prim Dressing: Promogran Prisma Matrix, 4.34 (sq in) (silver collagen) (DME) (Dispense As Written) 1 x Per Day/30 Days ary Discharge Instructions: Moisten collagen with saline or hydrogel Secondary Dressing: Woven Gauze Sponge, Non-Sterile 4x4 in (DME) (Generic) 1 x Per Day/30 Days Discharge Instructions: Apply over primary dressing as directed. Secured With: Insurance underwriter, Sterile 2x75 (in/in) (DME) (Generic) 1 x Per Day/30 Days Discharge Instructions: Secure with stretch gauze as directed. Secured With: 39M Medipore H Soft Cloth Surgical T ape, 4 x 10 (in/yd) (DME) (Generic) 1 x Per Day/30 Days Discharge Instructions: Secure with tape as directed. WOUND #13: - T Great Wound Laterality: Dorsal, Left oe Cleanser: Soap and Water 1 x Per Day/30 Days Discharge Instructions: May shower and wash wound with dial antibacterial soap and water prior to dressing change. Cleanser: Wound Cleanser 1 x Per Day/30 Days Discharge Instructions: Cleanse the wound with wound cleanser prior to applying a clean dressing using gauze sponges, not tissue or  cotton balls. Prim Dressing: Promogran Prisma Matrix, 4.34 (sq in) (silver collagen) (DME) (Dispense As Written) 1 x Per Day/30 Days ary Discharge Instructions: Moisten collagen with saline or hydrogel Secondary Dressing: Woven Gauze Sponge, Non-Sterile 4x4 in (DME) (Generic) 1 x Per Day/30 Days Discharge Instructions: Apply over primary dressing as directed. Secured With: 39M Medipore H Soft Cloth Surgical T ape, 4 x 10 (in/yd) (DME) (Generic) 1 x Per Day/30 Days Discharge Instructions: Secure with tape as directed. WOUND #16: - Knee Wound Laterality: Left Cleanser: Normal Saline (DME) (Generic) 3 x Per Week/30 Days Discharge Instructions: Cleanse the wound with Normal Saline prior to applying a clean dressing using gauze sponges, not tissue or cotton balls. Prim Dressing: Hydrofera Blue Classic Foam, 4x4 in (DME) (Dispense As Written) 3 x Per Week/30 Days ary Discharge Instructions: Moisten with saline prior to applying to wound bed Secondary Dressing: ABD Pad, 8x10 (DME) (Generic) 3 x Per Week/30 Days Discharge Instructions: Apply over primary dressing as directed. Secured With: [employer], 4.5x3.1 (in/yd) (DME) (Generic) 3 x Per Week/30 Days Discharge Instructions: Secure with Kerlix as directed. Secured With: Transpore Surgical T ape, 2x10 (in/yd) 3 x Per Week/30 Days Discharge Instructions: Secure dressing with tape as directed. WOUND #9: - Calcaneus Wound Laterality: Left Cleanser: Soap and Water 1 x Per Day/30 Days Discharge Instructions: May shower and wash wound with dial antibacterial soap and water prior to dressing change. Cleanser: Wound Cleanser 1 x Per Day/30 Days Discharge Instructions: Cleanse the wound with wound cleanser prior to applying a clean dressing using gauze sponges, not tissue or cotton balls. Prim Dressing: Promogran Prisma Matrix, 4.34 (sq in) (silver collagen) (DME) (Dispense As Written) 1 x Per Day/30 Days ary Discharge Instructions: Moisten  collagen with saline or hydrogel Secondary Dressing: ALLEVYN Heel 4 1/2in x 5 1/2in / 10.5cm x 13.5cm (DME) (Generic) 1 x Per Day/30 Days Discharge Instructions: Apply over primary dressing as directed. Secondary Dressing: Woven Gauze Sponge, Non-Sterile 4x4 in (DME) (Generic) 1 x Per Day/30 Days Discharge Instructions: Apply over primary dressing as directed. Secured With: [employer], 4.5x3.1 (in/yd) (DME) (Generic) 1 x Per Day/30 Days Discharge Instructions: Secure with Kerlix as directed. Secured With: 39M Medipore H Soft Cloth Surgical T ape, 4 x 10 (in/yd) (DME) (Generic) 1 x Per Day/30 Days Discharge Instructions: Secure with tape as directed. [name], [name] ([id number]) [id number] Page 15 of 17 06/25/2023: The wound on her right great  toe nailbed has healed. The other wounds on her toes are unchanged and continue to have bone/joints exposed. The ulcer on her heel is smaller and shallower with very little necrotic tissue present. She fell last week and split her knee open. Apparently she presented to the ED greater than 12 hours after injury and they attempted to loosely suture the site. The sutures have pulled through her skin and they are doing absolutely nothing at this point. The wound is full of nonviable tissue. The muscle fascia is visible. Her PCP has her on doxycycline. I first removed the sutures from her knee and then proceeded to debride slough, necrotic fat and subcutaneous tissue. This was poorly tolerated by the patient and the wound is in need of further debridement. Unfortunately, she has a stated allergy to iodine and I do not want to risk using Iodoflex or Iodosorb. We will use Hydrofera Blue classic in hopes that the drying action of the dressing will help pull out more of the nonviable tissue. She is already taking doxycycline; if the wound does not improve we will take a culture to see if adequate coverage is being provided. I debrided  slough from her toe wounds and slough and subcutaneous tissue from her heel wound. Will continue Prisma silver collagen to all of these locations. Unfortunately, we do not have any available clinic openings next week as I would really like to follow this wound more closely; we will put her on our cancellation list. For now, she is scheduled to follow-up in 2 weeks. Electronic Signature(s) Signed: 07/28/2023 4:13:51 PM By: Megan Guess MD FACS Previous Signature: 07/28/2023 2:48:06 PM Version By: Megan Guess MD FACS Previous Signature: 07/28/2023 2:46:33 PM Version By: Megan Guess MD FACS Previous Signature: 07/01/2023 7:45:46 AM Version By: Megan Guess MD FACS Previous Signature: 07/01/2023 5:45:00 PM Version By: Megan Deed RN, BSN Previous Signature: 06/25/2023 4:39:37 PM Version By: Megan Guess MD FACS Entered By: [name] on 07/28/2023 13:11:36 -------------------------------------------------------------------------------- HxROS Details Patient Name: Date of Service: Megan Reap. 06/25/2023 2:45 PM Medical Record Number: [id number] Patient Account Number: [id number] Date of Birth/Sex: Treating RN: 08-Sep-1928 (89 y.o. F) Primary Care Provider: Jorge [name] Other Clinician: Referring Provider: Treating Provider/Extender: Tiajuana Amass in Treatment: 15 Information Obtained From Patient Caregiver Chart Eyes Medical History: Positive for: Cataracts Hematologic/Lymphatic Medical History: Positive for: Anemia Past Medical History Notes: Thrombocytopenia, Anticoagulant long-term use Respiratory Medical History: Negative for: Chronic Obstructive Pulmonary Disease (COPD) Past Medical History Notes: Pulmonary embolus Cardiovascular Medical History: Positive for: Arrhythmia - a-fib; Congestive Heart Failure; Hypotension; Peripheral Venous Disease Gastrointestinal Medical History: Past Medical History Notes: Hiatal  hernia Endocrine Medical History: Past Medical History Notes: Hyperthyroidism, Hypothyroidism Rudden, Toye [name] ([id number]) [id number] Page 16 of 17 Genitourinary Medical History: Past Medical History Notes: CKD stage III Musculoskeletal Medical History: Positive for: 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 Past Medical History Notes: arthritis Neurologic Medical History: Positive for: Neuropathy HBO Extended History Items Eyes: Cataracts Immunizations Pneumococcal Vaccine: Received Pneumococcal Vaccination: No Implantable Devices None Hospitalization / Surgery History Type of Hospitalization/Surgery Abdominal aortogram w/lower extremity Amputation toe (Right) Colonoscopy Elbow surgery Laparoscopic hysterectomy Family and Social History Cancer: Yes - Siblings; Diabetes: No; Heart Disease: Yes; Stroke: Yes - Mother; Former smoker - quit 2003; Marital Status - Widowed; Alcohol Use: Never; Drug Use: No History; Caffeine Use: Never; Financial Concerns: No; Food, Clothing or Shelter Needs: No; Support System Lacking: No; Transportation Concerns: No Electronic Signature(s) Signed: 06/25/2023 4:49:16 PM  By: Megan Guess MD FACS Entered By: [name] on 06/25/2023 13:33:02 -------------------------------------------------------------------------------- SuperBill Details Patient Name: Date of Service: Megan Reap. 06/25/2023 Medical Record Number: [id number] Patient Account Number: [id number] Date of Birth/Sex: Treating RN: 01/05/1928 (89 y.o. 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 without evidence of necrosis Apsey, Latanya [name] ([id number]) [id number] Page 17 of 17 I73.9 Peripheral vascular disease, unspecified I50.32 Chronic diastolic (congestive) heart failure N18.32 Chronic kidney disease, stage 3b I87.2 Venous insufficiency (chronic) (peripheral) Facility Procedures : CPT4 Code Description: [id number] 11042 - DEB SUBQ TISSUE 20 SQ CM/< ICD-10 Diagnosis Description L89.623 Pressure ulcer of left heel, stage 3 Modifier: Quantity: 1 : CPT4 Code Description: [id number] - DEBRIDE WOUND 1ST 20 SQ CM OR < ICD-10 Diagnosis Description 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 witho L97.526 Non-pressure chronic ulcer of other part of left foot  with bone involvement withou L97.825 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 involvement Modifier: ut evidence of necr t evidence of necro without evidence o Quantity: 1 osis sis f necrosis Physician Procedures : CPT4 Code Description Modifier [id number] - WC PHYS LEVEL 3 - EST PT ICD-10 Diagnosis Description L89.623 Pressure ulcer of left heel, 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 without evidence of  necr 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 without evidence of necro L97.825 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 involvement without evidence o Quantity: 1 osis sis f necrosis : [id number] 11042 - WC PHYS SUBQ TISS 20 SQ CM ICD-10 Diagnosis Description L89.623 Pressure ulcer of left heel, stage 3 Quantity: 1 : [id number] 97597 - WC PHYS DEBR WO ANESTH 20 SQ CM ICD-10 Diagnosis Description L97.516 Non-pressure chronic ulcer of  other part of right foot with bone involvement without evidence of necr L97.526 Non-pressure chronic ulcer of other part of left foot  with bone involvement without evidence of necro L97.825 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 involvement without evidence o Quantity: 1 osis sis f necrosis Electronic Signature(s) Signed: 07/28/2023 2:47:03 PM By: Megan Guess MD FACS Previous Signature: 06/25/2023 4:40:08 PM Version By: Megan Guess MD FACS Entered By: [name] on 07/28/2023 11:47:02

## 2023-07-03 ENCOUNTER — Telehealth (INDEPENDENT_AMBULATORY_CARE_PROVIDER_SITE_OTHER): Payer: Medicare HMO | Admitting: Cardiology

## 2023-07-03 ENCOUNTER — Encounter (HOSPITAL_BASED_OUTPATIENT_CLINIC_OR_DEPARTMENT_OTHER): Payer: Self-pay

## 2023-07-03 VITALS — Ht 63.0 in | Wt 156.0 lb

## 2023-07-03 DIAGNOSIS — I739 Peripheral vascular disease, unspecified: Secondary | ICD-10-CM

## 2023-07-03 DIAGNOSIS — Z7189 Other specified counseling: Secondary | ICD-10-CM

## 2023-07-03 DIAGNOSIS — I4821 Permanent atrial fibrillation: Secondary | ICD-10-CM | POA: Diagnosis not present

## 2023-07-03 DIAGNOSIS — D6869 Other thrombophilia: Secondary | ICD-10-CM

## 2023-07-03 DIAGNOSIS — I5032 Chronic diastolic (congestive) heart failure: Secondary | ICD-10-CM

## 2023-07-03 DIAGNOSIS — I350 Nonrheumatic aortic (valve) stenosis: Secondary | ICD-10-CM | POA: Diagnosis not present

## 2023-07-03 DIAGNOSIS — I272 Pulmonary hypertension, unspecified: Secondary | ICD-10-CM | POA: Diagnosis not present

## 2023-07-03 DIAGNOSIS — Z7901 Long term (current) use of anticoagulants: Secondary | ICD-10-CM

## 2023-07-03 NOTE — Progress Notes (Signed)
Virtual Visit via Telephone Note   Because of [name]'s co-morbid illnesses, she is at least at moderate risk for complications without adequate follow up.  This format is felt to be most appropriate for this patient at this time.  The patient did not have access to video technology/had technical difficulties with video requiring transitioning to audio format only (telephone).  All issues noted in this document were discussed and addressed.  No physical exam could be performed with this format.  Please refer to the patient's chart for her consent to telehealth for [facility].   The patient was identified using 2 identifiers.  Patient Location: Home Provider Location: Office/Clinic   Date:  07/03/2023   ID:  [name], DOB 1928/05/05, MRN [id number]  PCP:  Camie Patience, FNP  Cardiologist:  [name], MD PhD  Referring MD: Camie Patience, FNP   CC: follow up  History of Present Illness: .    [name] is a 89 y.o. female with a hx of atrial fibrillation on coumadin, essential hypertension, pulmonary hypertension, low flow low gradient aortic stenosis, PAD with wound, and chronic diastolic heart failure, here for follow up. Her initial consult (telemedicine) was 11/25/18.    Cardiac history: Previously followed by Dr. Orvis Brill at [facility] (notes under media tab). Per notes:  -echo 2014, lexiscan 2015 low risk,  -echo 2019 hyperdynamic EF, septal flattening, severe RV dilation, RVSP noted as critical (near systemic) pulmonary hypertension, severe biatrial enlargement, dilated coronary sinus ?persistent left SVC.  -Venous study 2019 with bilateral mild insufficiency -Echo 11/10/17 with hyperdynamic EF, septal flattening, severe RV dilation, severe TR, severe pulmonary hypertension (listed at 71 mmHg in note) -Echo 12/19/22 with EF 65-70%, RVSP 68.6, mod-severe TR, moderate low flow low gradient aortic stenosis (prior echo 10/25/22 noted severe  low flow low gradient but there was contamination from TR) -s/p peripheral angiography and angioplasty 03/04/23 to left SFA, popliteal, peroneal arteries   Seen by Dr. Karin Lieu 05/15/23. Severely edematous, requiring O2 at the visit.[name] Kitchen Recommended medical management, also discussed recommendation for hospice. Would consider repeat angiogram if wounds worsen.  At her telephone visit 05/20/2023, we reviewed her recent hospitalization. Had hyponatremia, cellulitis. Home health was restarted. Nephrology initially recommended single dose tolvaptan, then resumption of bumex. However, on day of discharge they recommended to hold bumex/spironolactone and use salt tabs/fluid restriction/increase protein. When I spoke with her, she continued to have severe LE edema. She had finished her salt tablets the day prior. Had visit with PCP 9/5, put back on bumex due to edema, had labs done. She had been limiting fluid intake. Her daughter [name] felt that she was stable to slightly improved since the hospitalization. We reviewed all the issues she was facing, discussed goals of care again. They were concerned about having to stop PT and other treatments on hospice. [name] does see more frailty and confusion with each hospitalization.  Today,  Seen in the ER 06/18/23 after having a fall and landing on her knee. Had laceration repair. Has been a traumatic experience. Spent an hour today reviewing events, discussing goals of care, see below. There are also concerns for worsening dementia, discussed.  ROS: ROS otherwise negative except as noted.   Studies Reviewed: [name] Kitchen    EKG:     (virtual visit, not ordered)  Physical Exam:   VS:  Ht 5\' 3"  (1.6 m)   Wt 156 lb (70.8 kg)   BMI 27.63 kg/m  Wt Readings from Last 3 Encounters:  07/03/23 156 lb (70.8 kg)  06/18/23 154 lb (69.9 kg)  05/20/23 146 lb (66.2 kg)    No distress Vitals reviewed Per report, severe LE edema  ASSESSMENT AND PLAN: .    Goals of care: we have  discussed now on several occasions. With recent fall/ER visit and concern for infection, again discussed preferences. It seems that dementia may be worsening as well, as there are significant memory issues, intermittent anger. This has taken a significant toll on her daughter/caregiver [name]. Discussed respite care. Again discussed hospice, [name] does not feet that it is time yet. Patient is DNR. They have spoken to palliative care in the past, not interested to speaking to them again right now. Will continue to address.  Very complex medical decision making. High risk for clinical deterioration, discussed today. I favor continued discussion for hospice as they will have access to more services this way, but I understand concern for stopping therapies. Will continue to address.  Chronic issues, discussed and unchanged:  Low flow low gradient aortic stenosis -initially reported as severe, then re-evaluated and noted as moderate -With her comorbidities, unclear that she would be able to tolerate TAVR and she could not tolerate open AVR. They agree and do not wish to pursue at this time.  Peripheral edema 2/2 severe pulmonary hypertension and chronic diastolic heart failure Hyponatremia, history of -continue bumex, spironolactone -continue daily weights, fluid restriction -we have extensively discussed her pulmonary hypertension, see prior notes  Lower extremity wounds PAD -following with Dr. Edilia Bo and Dr. Karin Lieu -nonhealing lower extremity wounds, s/p peripheral angiogram. recently treated for infection, followed by wound care  Hypertension: -continue diuretics as above  Atrial fibrillation, permanent Slow ventricular response -CHA2DS2/VAS=at least 5 -on coumadin, managed per PCP.   Plan for follow up: 6-8 weeks, virtual ok  Today, I have spent 60 minutes with the patient with telehealth technology discussing the above problems.  Additional time spent in chart review,  documentation, and communication.   Signed, [name], MD   [name], MD, PhD, [facility] [place]  [facility]  [place]  [facility] [address], [address] [place], [place] [address] [phone]

## 2023-07-03 NOTE — Patient Instructions (Addendum)
Medication Instructions:  Your physician recommends that you continue on your current medications as directed. Please refer to the Current Medication list given to you today.   Labwork: NONE  Testing/Procedures: NONE  Follow-Up: 09/14/2023 TELEPHONE WITH DR Cristal Deer

## 2023-07-08 ENCOUNTER — Encounter (HOSPITAL_BASED_OUTPATIENT_CLINIC_OR_DEPARTMENT_OTHER): Payer: Medicare HMO | Admitting: General Surgery

## 2023-07-08 DIAGNOSIS — L89623 Pressure ulcer of left heel, stage 3: Secondary | ICD-10-CM | POA: Diagnosis not present

## 2023-07-10 NOTE — Progress Notes (Addendum)
[name] ([id number]) [id number] Page 1 of 17 Visit Report for 07/08/2023 Chief Complaint Document Details Patient Name: Date of Service: [name] 07/08/2023 2:45 PM Medical Record Number: [id number] Patient Account Number: [id number] Date of Birth/Sex: Treating RN: April 05, 1928 (89 y.o. F) Primary Care Provider: [name] Other Clinician: Referring Provider: Treating Provider/Extender: Tiajuana Amass in Treatment: 17 Information Obtained from: Patient Chief Complaint 01/28/2022; right second toe amputation site dehiscence and bilateral lower extremity wounds. 03/10/2023: pressure ulcers of right heel, ulcers on toes Electronic Signature(s) Signed: 07/08/2023 4:49:15 PM By: Duanne Guess MD FACS Previous Signature: 07/08/2023 4:48:11 PM Version By: Duanne Guess MD FACS Entered By: Duanne Guess on 07/08/2023 13:49:15 -------------------------------------------------------------------------------- Debridement Details Patient Name: Date of Service: Megan Reap. 07/08/2023 2:45 PM Medical Record Number: [id number] Patient Account Number: [id number] Date of Birth/Sex: Treating RN: 10-27-1927 (89 y.o. [name] Primary Care Provider: [name] Other Clinician: Referring Provider: Treating Provider/Extender: Claudie Leach Weeks in Treatment: 17 Debridement Performed for Assessment: Wound #16 Left Knee Performed By: Physician Duanne Guess, MD The following information was scribed by: Zenaida Deed The information was scribed for: Duanne Guess Debridement Type: Debridement Level of Consciousness (Pre-procedure): Awake and Alert Pre-procedure Verification/Time Out Yes - 15:30 Taken: Start Time: 15:33 Pain Control: Lidocaine 4% T opical Solution Percent of Wound Bed Debrided: 100% T Area Debrided (cm): otal 5 Tissue and other material debrided: Viable, Non-Viable, Slough,  Subcutaneous, Slough Level: Skin/Subcutaneous Tissue Debridement Description: Excisional Instrument: Curette Bleeding: Minimum Hemostasis Achieved: Pressure Procedural Pain: 0 Post Procedural Pain: 0 Response to Treatment: Procedure was tolerated well Level of Consciousness (Post- Awake and Alert procedure): Post Debridement Measurements of Total Wound Length: (cm) 1.3 Width: (cm) 4.9 Depth: (cm) 0.3 [name], [name] ([id number]) [id number] Page 2 of 17 Volume: (cm) 1.501 Character of Wound/Ulcer Post Debridement: Improved Post Procedure Diagnosis Same as Pre-procedure Electronic Signature(s) Signed: 07/08/2023 5:50:32 PM By: Duanne Guess MD FACS Signed: 07/09/2023 5:49:41 PM By: Zenaida Deed RN, BSN Entered By: Zenaida Deed on 07/08/2023 12:37:03 -------------------------------------------------------------------------------- Debridement Details Patient Name: Date of Service: Megan Reap. 07/08/2023 2:45 PM Medical Record Number: [id number] Patient Account Number: [id number] Date of Birth/Sex: Treating RN: 07/14/1928 (89 y.o. [name] Primary Care Provider: [name] Other Clinician: Referring Provider: Treating Provider/Extender: Claudie Leach Weeks in Treatment: 17 Debridement Performed for Assessment: Wound #10 Left T Second oe Performed By: Physician Duanne Guess, MD The following information was scribed by: Zenaida Deed The information was scribed for: Duanne Guess Debridement Type: Debridement Level of Consciousness (Pre-procedure): Awake and Alert Pre-procedure Verification/Time Out Yes - 15:30 Taken: Start Time: 15:33 Pain Control: Lidocaine 4% T opical Solution Percent of Wound Bed Debrided: 100% T Area Debrided (cm): otal 0.22 Tissue and other material debrided: Non-Viable, Slough, Slough Level: Non-Viable Tissue Debridement Description: Selective/Open Wound Instrument:  Curette Bleeding: Minimum Hemostasis Achieved: Pressure Procedural Pain: 0 Post Procedural Pain: 0 Response to Treatment: Procedure was tolerated well Level of Consciousness (Post- Awake and Alert procedure): Post Debridement Measurements of Total Wound Length: (cm) 0.4 Stage: Category/Stage IV Width: (cm) 0.7 Depth: (cm) 0.1 Volume: (cm) 0.022 Character of Wound/Ulcer Post Debridement: Improved Post Procedure Diagnosis Same as Pre-procedure Electronic Signature(s) Signed: 07/08/2023 5:50:32 PM By: Duanne Guess MD FACS Signed: 07/09/2023 5:49:41 PM By: Zenaida Deed RN, BSN Entered By: Zenaida Deed on 07/08/2023 12:37:55 [name], [name] ([id number]) [id number] Page 3 of 17 -------------------------------------------------------------------------------- Debridement Details Patient Name: Date of Service: [name],  [name] 07/08/2023 2:45 PM Medical Record Number: [id number] Patient Account Number: [id number] Date of Birth/Sex: Treating RN: 1928-07-12 (89 y.o. [name] Primary Care Provider: [name] Other Clinician: Referring Provider: Treating Provider/Extender: Claudie Leach Weeks in Treatment: 17 Debridement Performed for Assessment: Wound #13 Left,Dorsal T Great oe Performed By: Physician Duanne Guess, MD The following information was scribed by: Zenaida Deed The information was scribed for: Duanne Guess Debridement Type: Debridement Severity of Tissue Pre Debridement: Bone involvement without necrosis Level of Consciousness (Pre-procedure): Awake and Alert Pre-procedure Verification/Time Out Yes - 15:30 Taken: Start Time: 15:33 Pain Control: Lidocaine 4% T opical Solution Percent of Wound Bed Debrided: 100% T Area Debrided (cm): otal 0.99 Tissue and other material debrided: Non-Viable, Slough, Slough Level: Non-Viable Tissue Debridement Description: Selective/Open Wound Instrument:  Curette Bleeding: Minimum Hemostasis Achieved: Pressure Procedural Pain: 0 Post Procedural Pain: 0 Response to Treatment: Procedure was tolerated well Level of Consciousness (Post- Awake and Alert procedure): Post Debridement Measurements of Total Wound Length: (cm) 1.4 Width: (cm) 0.9 Depth: (cm) 0.1 Volume: (cm) 0.099 Character of Wound/Ulcer Post Debridement: Stable Severity of Tissue Post Debridement: Bone involvement without necrosis Post Procedure Diagnosis Same as Pre-procedure Electronic Signature(s) Signed: 07/08/2023 5:50:32 PM By: Duanne Guess MD FACS Signed: 07/09/2023 5:49:41 PM By: Zenaida Deed RN, BSN Entered By: Zenaida Deed on 07/08/2023 12:39:40 -------------------------------------------------------------------------------- Debridement Details Patient Name: Date of Service: Megan Reap. 07/08/2023 2:45 PM Medical Record Number: [id number] Patient Account Number: [id number] Date of Birth/Sex: Treating RN: 1928-04-01 (89 y.o. [name] Primary Care Provider: [name] Other Clinician: Referring Provider: Treating Provider/Extender: Claudie Leach Weeks in Treatment: 17 Debridement Performed for Assessment: Wound #11 Right T Second oe Performed By: Physician Duanne Guess, MD The following information was scribed by: Zenaida Deed The information was scribed for: Duanne Guess Debridement Type: Debridement Level of Consciousness (Pre-procedure): Awake and Alert Pre-procedure Verification/Time Out Yes - 15:30 Taken: Start Time: 15:33 Pain Control: Lidocaine 4% Topical Solution [name], [name] ([id number]) [id number] Page 4 of 17 Percent of Wound Bed Debrided: 100% T Area Debrided (cm): otal 0.22 Tissue and other material debrided: Non-Viable, Slough, Slough Level: Non-Viable Tissue Debridement Description: Selective/Open Wound Instrument: Curette Bleeding: Minimum Hemostasis  Achieved: Pressure Procedural Pain: 0 Post Procedural Pain: 0 Response to Treatment: Procedure was tolerated well Level of Consciousness (Post- Awake and Alert procedure): Post Debridement Measurements of Total Wound Length: (cm) 0.4 Stage: Category/Stage IV Width: (cm) 0.7 Depth: (cm) 0.2 Volume: (cm) 0.044 Character of Wound/Ulcer Post Debridement: Improved Post Procedure Diagnosis Same as Pre-procedure Electronic Signature(s) Signed: 07/08/2023 5:50:32 PM By: Duanne Guess MD FACS Signed: 07/09/2023 5:49:41 PM By: Zenaida Deed RN, BSN Entered By: Zenaida Deed on 07/08/2023 12:41:08 -------------------------------------------------------------------------------- Debridement Details Patient Name: Date of Service: Megan Reap. 07/08/2023 2:45 PM Medical Record Number: [id number] Patient Account Number: [id number] Date of Birth/Sex: Treating RN: [birth year] (88 y.o. [name] Primary Care Provider: [name] Other Clinician: Referring Provider: Treating Provider/Extender: Claudie Leach Weeks in Treatment: 17 Debridement Performed for Assessment: Wound #9 Left Calcaneus Performed By: Physician Duanne Guess, MD The following information was scribed by: Zenaida Deed The information was scribed for: Duanne Guess Debridement Type: Debridement Level of Consciousness (Pre-procedure): Awake and Alert Pre-procedure Verification/Time Out Yes - 15:30 Taken: Start Time: 15:33 Pain Control: Lidocaine 4% T opical Solution Percent of Wound Bed Debrided: 100% T Area Debrided (cm): otal 0.09 Tissue and other material debrided: Viable, Non-Viable, Slough, Subcutaneous,  Skin: Epidermis, Slough Level: Skin/Subcutaneous Tissue Debridement Description: Excisional Instrument: Curette Bleeding: Minimum Hemostasis Achieved: Pressure Procedural Pain: 0 Post Procedural Pain: 0 Response to Treatment: Procedure was tolerated well Level of  Consciousness (Post- Awake and Alert procedure): Post Debridement Measurements of Total Wound Length: (cm) 0.4 Stage: Category/Stage III Width: (cm) 0.3 Depth: (cm) 0.3 Volume: (cm) 0.028 Character of Wound/Ulcer Post Debridement: Improved [name], [name] ([id number]) [id number] Page 5 of 17 Post Procedure Diagnosis Same as Pre-procedure Electronic Signature(s) Signed: 07/28/2023 4:13:51 PM By: Duanne Guess MD FACS Signed: 07/28/2023 4:21:34 PM By: Zenaida Deed RN, BSN Previous Signature: 07/08/2023 5:50:32 PM Version By: Duanne Guess MD FACS Previous Signature: 07/09/2023 5:49:41 PM Version By: Zenaida Deed RN, BSN Entered By: Zenaida Deed on 07/28/2023 13:10:16 -------------------------------------------------------------------------------- HPI Details Patient Name: Date of Service: Megan Reap. 07/08/2023 2:45 PM Medical Record Number: [id number] Patient Account Number: [id number] Date of Birth/Sex: Treating RN: Dec 20, 1927 (89 y.o. F) Primary Care Provider: [name] Other Clinician: Referring Provider: Treating Provider/Extender: Claudie Leach Weeks in Treatment: 17 History of Present Illness HPI Description: Admission 01/28/2022 Ms. 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 She states she has tried Medihoney and silver alginate in the past to this area with little benefit. She also has 2 small areas limited to skin breakdown to her lower extremities bilaterally. She has chronic venous insufficiency but not has not been wearing her compression stockings. She states she bumped her legs against an object and not so the wound started.  She has been using Medihoney to the sites. She denies signs of infection. 6/2; patient presents for follow-up. She had an x-ray of her right foot done at last clinic visit and this was negative for evidence of osteomyelitis. She also had a wound culture done that showed extra high levels of Staph aureus. I recommended Keystone antibiotics for this and this was ordered. She had ABIs with TBI's done as well that showed monophasic waveforms to the right foot with TBI of 0 and an ABI of 0.52. Urgent referral was made to vein and vascular and she saw Dr. Durwin Nora on 6/1, yesterday and he recommended an arteriogram. This is scheduled for 6/16. Patient also reports a new wound to the right great toe. This is a blister that has ruptured. She also reports increased erythema to the toe. 6/6; the patient was worked in urgently today at the insistence of her daughter out of concern for a new wound on the lateral part of the plantar right great toe. She has her original postsurgical wound after the amputation of the right second toe, she has a wound on the medial part of the right great toe. The patient is apparently going for an angiogram by Dr. Durwin Nora in 2 weeks time. 6/13; patient presents for follow-up. She has been using bacitracin to the abrasion on the right great toe. She has been using collagen and Keystone antibiotic to the amputation site. She has no issues or complaints today. She denies signs of infection. 6/22; patient presents for follow-up. She states that her abdominal aortogram was canceled due to her renal function. She has been using Keystone antibiotics to the amputation site and Medihoney to the right great toe wound. At the  pace of the right great toe she has a slitlike open area that she thinks was caused by the tape from the dressing. 6/29; patient presents for follow-up. She has been using Keystone antibiotics to the amputation site along with collagen. She has been using Medihoney to  the right great toe wound. She has no other wounds. She denies signs of infection. 7/13; patient presents for follow-up. She has been using Keystone antibiotics and collagen to the wound sites. She currently denies signs of infection. She states she is scheduled to see me nephrology next month. 7/20; patient presents for follow-up. She continue Keystone antibiotics and collagen to the wound sites. She has no issues or complaints today. 8/1; patient presents for follow up. She continues to use keystone antibiotics and collagen to the wound sites. She has no issues or complaints today. 8/15; patient presents for follow-up. She has been using Keystone antibiotics and collagen to the wound sites. She followed up with her nephrologist who made medication changes. She is supposed to get a repeat BMP in 2 weeks. Her decrease in renal function was a limiting factor in obtaining an arteriogram for potential intervention for revascularization. She currently denies signs of infection. 9/2; patient presents for follow-up. She has been using Keystone antibiotic and collagen to the wound beds. She has no issues or complaints today. Reports there has been improvement in kidney function however not cleared to have her arteriogram just yet. 10/10; patient presents for follow-up. She has been using Keystone antibiotic and collagen to the wound beds. She reports 2 new wounds 1 to the anterior right lower extremity and another to the plantar aspect of the right foot. She states that the right plantar foot wound was caused by the home health nurse changing the dressing and causing a skin tear. She is not sure how the right anterior leg wound started. It appears to be from trauma. She denies signs of infection. 10/19; patient presents for follow-up. She has been using Keystone antibiotic and collagen to the right great toe wound. She is been using silver alginate to the right anterior and right plantar foot wound. She has  been using Tubigrip to the right lower extremity. The plantar foot wound has healed. She has no issues or complaints today. 11/3; since the patient was last here she was seen in urgent care apparently for an area on the dorsal aspect of the right fifth toe perhaps over the PIP. I saw a picture of this on the daughter's cell phone. There was slough on this. Urgent care gave him doxycycline. She is also changed the dressing to all wounds back the [facility] and collagen which includes her right leg and left first toe [name], [name] ([id number]) [phone].pdf Page 6 of 17 11/16; patient presents for follow-up. She has a new wound to the left knee. She states she fell. She has been using antibiotic ointment and collagen to this area. She has been using collagen and Keystone antibiotic to the right great toe wound. The anterior right leg wound is healed. She denies signs of infection. 11/30; patient presents for follow-up. The right great toe wound has healed. She has 1 remaining wound to the left knee. She has been using Hydrofera Blue and Medihoney here. 12/19; patient presents for follow-up. Her left knee wound has healed. She has no issues or complaints today. 09/30/2022 Patient's daughter called for an appointment due to increased swelling to her lower extremities bilaterally. Today patient presents with increased swelling to her lower  extremities although there is no increased redness or warmth. She was evaluated by her nephrologist who increased her diuretics. Per patient and daughter her swelling has gone down to the leg Over the past several days. She does not have any open wounds. She was advised to elevate her legs and not consume excess salt By her PCP and nephrologist. Patient has compression stockings that she has been using sporadically. READMISSION 03/10/2023 Since her last visit to the clinic, the patient has been hospitalized at least twice, in February with COVID  pneumonia and CHF exacerbation. She was discharged to a skilled nursing facility and then readmitted in April with fevers. She was noted to have pressure ulcers on her heels and sacrum. Per family declined skilled nursing placement upon discharge and she has apparently been residing at home. She has followed with the vascular surgery clinic regarding peripheral artery disease and due to ulcerations on her feet, she ultimately underwent arteriography with balloon angioplasties of the superficial femoral artery, popliteal artery and peroneal artery on the left, along with shockwave ultrasound assisted balloon angioplasty of the popliteal artery and distal SFA. Given her multiple significant medical comorbidities, she is not a candidate for open surgery. She is deemed to be maximally vascularized this procedure, which was performed on 04 March 2023. T oday, she has a stage III pressure ulcer on her left heel and wounds on the PIP joints of her right third and left second toes. No sacral wound is present and the ulcer on her left heel has closed. 03/18/2023: She has a new ulcer on her left great toe. It was just noticed yesterday and the etiology is unknown. The fat layer is exposed and there is some slough accumulation. The other wounds are all essentially unchanged in size. There is a little bit more granulation tissue on the other toe wounds, but bone is still frankly exposed on both sides. The heel ulcer has thick slough accumulation. 03/26/2023: She has a new wound on the tip of her left third toe. It is black. Neither the patient nor her daughter are aware of how it began. The other wounds are stable. 03/31/2023: The culture that I took from her left third toe returned with MRSA. We contacted the patient's daughter to have her stop levofloxacin and start doxycycline, but she has not yet picked up the new antibiotic. All of her wounds have accumulated slough. Bone remains exposed. She is scheduled to  follow- up with vascular surgery tomorrow to evaluate the patency of her revascularization. 04/09/2023: She saw her vascular surgeon and her ABIs are improved; her TBI's could not be checked due to her bandages. She did finally start taking the prescribed doxycycline. All of her wounds look about the same today. 04/23/2023: No significant changes to any of her wounds aside from being slightly smaller other than the left dorsal great toe wound, which is stable. She has accumulated slough and eschar on all of the surfaces. 04/30/2023: All of the wounds are stable with the exception of the left dorsal great toe wound which now has tendon exposed in addition to bone. There is slough and eschar accumulation on all sites. 05/15/2023: The patient was in the hospital and missed her appointment last week. The wound at the tip of her left third toe is healed. Everything else looks the same. 05/21/2023: No real change to any of the wounds. 05/28/2023: Once again, there has been no real change to any of her wounds except that the wound at the  tip of her left great toe is closed. 06/11/2019: No change to any of her wounds. 06/25/2023: The wound on her right great toe nailbed has healed. The other wounds on her toes are unchanged and continue to have bone/joints exposed. The ulcer on her heel is smaller and shallower with very little necrotic tissue present. She fell last week and split her knee open. Apparently she presented to the ED greater than 12 hours after injury and they attempted to loosely suture the site. The sutures have pulled through her skin and they are doing absolutely nothing at this point. The wound is full of nonviable tissue. The muscle fascia is visible. Her PCP has her on doxycycline. 07/08/2023: The wounds on her feet are basically unchanged although the heel ulcer may be slightly smaller. The wound on her left knee looks much better this week. It is cleaner but still has a layer of slough on the  surface. I can no longer see the muscle fascia. Electronic Signature(s) Signed: 07/08/2023 4:50:09 PM By: Duanne Guess MD FACS Entered By: Duanne Guess on 07/08/2023 13:50:09 -------------------------------------------------------------------------------- Physical Exam Details Patient Name: Date of Service: Megan Reap. 07/08/2023 2:45 PM Medical Record Number: [id number] Patient Account Number: [id number] Date of Birth/Sex: Treating RN: 1927-12-27 (89 y.o. F) Primary Care Provider: [name] Other Clinician: Referring Provider: Treating Provider/Extender: Claudie Leach Weeks in Treatment: [address], [name] ([id number]) [id number] Page 7 of 17 Hypertensive, asymptomatic. . . . no acute distress. Respiratory Normal work of breathing on supplemental oxygen. Notes 07/08/2023: The wounds on her feet are basically unchanged although the heel ulcer may be slightly smaller. The wound on her left knee looks much better this week. It is cleaner but still has a layer of slough on the surface. I can no longer see the muscle fascia. Electronic Signature(s) Signed: 07/08/2023 4:51:02 PM By: Duanne Guess MD FACS Entered By: Duanne Guess on 07/08/2023 13:51:02 -------------------------------------------------------------------------------- Physician Orders Details Patient Name: Date of Service: Megan Reap. 07/08/2023 2:45 PM Medical Record Number: [id number] Patient Account Number: [id number] Date of Birth/Sex: Treating RN: [birth year] (89 y.o. [name], [name] Primary Care Provider: [name] Other Clinician: Referring Provider: Treating Provider/Extender: Claudie Leach Weeks in Treatment: 17 The following information was scribed by: Zenaida Deed The information was scribed for: Duanne Guess Verbal / Phone Orders: No Diagnosis Coding ICD-10 Coding Code Description [phone]  Pressure ulcer of left heel, 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 without evidence of necrosis L97.825 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 involvement without evidence of necrosis I73.9 Peripheral vascular disease, unspecified I50.32 Chronic diastolic (congestive) heart failure N18.32 Chronic kidney disease, stage 3b I87.2 Venous insufficiency (chronic) (peripheral) Follow-up Appointments ppointment in 2 weeks. - Dr. Lady Gary - room 1 Return A Wed 11/13 @ 2:45 pm Anesthetic (In clinic) Topical Lidocaine 4% applied to wound bed Bathing/ Shower/ Hygiene May shower and wash wound with soap and water. Additional Orders / Instructions Follow Nutritious Diet - add in protein shakes every day to diet - recommend premier protein 500 mg x3 a day vitamin C, zinc 30-50 mg per day Home Health No change in wound care orders this week; continue Home Health for wound care. May utilize formulary equivalent dressing for wound treatment orders unless otherwise specified. Other Home Health  Orders/Instructions: - [facility] Wound Treatment Wound #10 - T Second oe Wound Laterality: Left Cleanser: Soap and Water 1 x Per Day/30 Days Discharge Instructions: May shower and wash wound with dial antibacterial soap and water prior to dressing change. Cleanser: Wound Cleanser 1 x Per Day/30 Days Discharge Instructions: Cleanse the wound with wound cleanser prior to applying a clean dressing using gauze sponges, not tissue or cotton balls. Peri-Wound Care: Sween Lotion (Moisturizing lotion) 1 x Per Day/30 Days Chaviano, Renesmee [name] ([id number]) [phone].pdf Page 8 of 17 Discharge Instructions: Apply moisturizing lotion as directed Prim Dressing: Promogran Prisma Matrix, 4.34 (sq in) (silver  collagen) (Dispense As Written) 1 x Per Day/30 Days ary Discharge Instructions: Moisten collagen with saline or hydrogel Secondary Dressing: Woven Gauze Sponge, Non-Sterile 4x4 in (Generic) 1 x Per Day/30 Days Discharge Instructions: Apply over primary dressing as directed. Secured With: [employer], 4.5x3.1 (in/yd) (Generic) 1 x Per Day/30 Days Discharge Instructions: Secure with Kerlix as directed. Secured With: 70M Medipore H Soft Cloth Surgical T ape, 4 x 10 (in/yd) (Generic) 1 x Per Day/30 Days Discharge Instructions: Secure with tape as directed. Wound #11 - T Second oe Wound Laterality: Right Cleanser: Soap and Water 1 x Per Day/30 Days Discharge Instructions: May shower and wash wound with dial antibacterial soap and water prior to dressing change. Cleanser: Wound Cleanser 1 x Per Day/30 Days Discharge Instructions: Cleanse the wound with wound cleanser prior to applying a clean dressing using gauze sponges, not tissue or cotton balls. Prim Dressing: Promogran Prisma Matrix, 4.34 (sq in) (silver collagen) (Dispense As Written) 1 x Per Day/30 Days ary Discharge Instructions: Moisten collagen with saline or hydrogel Secondary Dressing: Woven Gauze Sponge, Non-Sterile 4x4 in (Generic) 1 x Per Day/30 Days Discharge Instructions: Apply over primary dressing as directed. Secured With: Insurance underwriter, Sterile 2x75 (in/in) (Generic) 1 x Per Day/30 Days Discharge Instructions: Secure with stretch gauze as directed. Secured With: 70M Medipore H Soft Cloth Surgical T ape, 4 x 10 (in/yd) (Generic) 1 x Per Day/30 Days Discharge Instructions: Secure with tape as directed. Wound #13 - T Great oe Wound Laterality: Dorsal, Left Cleanser: Soap and Water 1 x Per Day/30 Days Discharge Instructions: May shower and wash wound with dial antibacterial soap and water prior to dressing change. Cleanser: Wound Cleanser 1 x Per Day/30 Days Discharge Instructions: Cleanse the wound  with wound cleanser prior to applying a clean dressing using gauze sponges, not tissue or cotton balls. Prim Dressing: Promogran Prisma Matrix, 4.34 (sq in) (silver collagen) (Dispense As Written) 1 x Per Day/30 Days ary Discharge Instructions: Moisten collagen with saline or hydrogel Secondary Dressing: Woven Gauze Sponge, Non-Sterile 4x4 in (Generic) 1 x Per Day/30 Days Discharge Instructions: Apply over primary dressing as directed. Secured With: 70M Medipore H Soft Cloth Surgical T ape, 4 x 10 (in/yd) (Generic) 1 x Per Day/30 Days Discharge Instructions: Secure with tape as directed. Wound #16 - Knee Wound Laterality: Left Cleanser: Normal Saline (Generic) 3 x Per Week/30 Days Discharge Instructions: Cleanse the wound with Normal Saline prior to applying a clean dressing using gauze sponges, not tissue or cotton balls. Prim Dressing: Hydrofera Blue Ready Transfer Foam, 4x5 (in/in) 3 x Per Week/30 Days ary Discharge Instructions: Apply to wound bed as instructed Secondary Dressing: Zetuvit Plus Silicone Border Dressing 5x5 (in/in) 3 x Per Week/30 Days Discharge Instructions: Apply silicone border over primary dressing as directed. Wound #9 - Calcaneus Wound Laterality: Left Cleanser: Soap and Water 1 x  Per Day/30 Days Discharge Instructions: May shower and wash wound with dial antibacterial soap and water prior to dressing change. Cleanser: Wound Cleanser 1 x Per Day/30 Days Discharge Instructions: Cleanse the wound with wound cleanser prior to applying a clean dressing using gauze sponges, not tissue or cotton balls. Prim Dressing: Promogran Prisma Matrix, 4.34 (sq in) (silver collagen) (Dispense As Written) 1 x Per Day/30 Days ary Discharge Instructions: Moisten collagen with saline or hydrogel Secondary Dressing: ALLEVYN Heel 4 1/2in x 5 1/2in / 10.5cm x 13.5cm (Generic) 1 x Per Day/30 Days Discharge Instructions: Apply over primary dressing as directed. Secondary Dressing: Woven Gauze  Sponge, Non-Sterile 4x4 in (Generic) 1 x Per Day/30 Days Discharge Instructions: Apply over primary dressing as directed. [name], [name] ([id number]) [id number] Page 9 of 17 Secured With: [employer], 4.5x3.1 (in/yd) (Generic) 1 x Per Day/30 Days Discharge Instructions: Secure with Kerlix as directed. Secured With: 12M Medipore H Soft Cloth Surgical T ape, 4 x 10 (in/yd) (Generic) 1 x Per Day/30 Days Discharge Instructions: Secure with tape as directed. Electronic Signature(s) Signed: 07/08/2023 5:50:32 PM By: Duanne Guess MD FACS Entered By: Duanne Guess on 07/08/2023 13:51:36 -------------------------------------------------------------------------------- Problem List Details Patient Name: Date of Service: Megan Reap. 07/08/2023 2:45 PM Medical Record Number: [id number] Patient Account Number: [id number] Date of Birth/Sex: Treating RN: 1928/09/02 (89 y.o. 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 03/10/2023 No Yes without evidence of necrosis 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 03/10/2023 No Yes without evidence of necrosis L97.825 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 06/25/2023 No Yes involvement without evidence of necrosis I73.9 Peripheral vascular disease, unspecified 03/10/2023 No Yes I50.32 Chronic diastolic (congestive) heart failure 03/10/2023 No Yes N18.32 Chronic kidney disease, stage 3b 03/10/2023 No Yes I87.2 Venous insufficiency (chronic) (peripheral) 03/10/2023 No Yes Inactive Problems ICD-10 Code  Description Active Date Inactive Date L97.522 Non-pressure chronic ulcer of other part of left foot with fat layer exposed 03/18/2023 03/18/2023 [name], [name] ([id number]) [phone].pdf Page 10 of 17 Resolved Problems Electronic Signature(s) Signed: 07/08/2023 4:47:26 PM By: Duanne Guess MD FACS Entered By: Duanne Guess on 07/08/2023 13:47:25 -------------------------------------------------------------------------------- Progress Note Details Patient Name: Date of Service: Megan Reap. 07/08/2023 2:45 PM Medical Record Number: [id number] Patient Account Number: [id number] Date of Birth/Sex: Treating RN: [birth year] (89 y.o. F) Primary Care Provider: [name] Other Clinician: Referring Provider: Treating Provider/Extender: Tiajuana Amass in Treatment: 17 Subjective Chief Complaint Information obtained from Patient 01/28/2022; right second toe amputation site dehiscence and bilateral lower extremity wounds. 03/10/2023: pressure ulcers of right heel, ulcers on toes History of Present Illness (HPI) Admission 01/28/2022 Ms. 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 She states she has tried Medihoney and silver alginate in the past to this area with little benefit. She also has 2 small areas limited to skin breakdown to her lower extremities bilaterally.  She has chronic venous insufficiency but not has not been wearing her compression stockings. She states she bumped her legs against an object and not so the wound started. She has been using Medihoney to the sites. She denies signs of infection. 6/2; patient presents for follow-up. She had an x-ray of her  right foot done at last clinic visit and this was negative for evidence of osteomyelitis. She also had a wound culture done that showed extra high levels of Staph aureus. I recommended Keystone antibiotics for this and this was ordered. She had ABIs with TBI's done as well that showed monophasic waveforms to the right foot with TBI of 0 and an ABI of 0.52. Urgent referral was made to vein and vascular and she saw Dr. Durwin Nora on 6/1, yesterday and he recommended an arteriogram. This is scheduled for 6/16. Patient also reports a new wound to the right great toe. This is a blister that has ruptured. She also reports increased erythema to the toe. 6/6; the patient was worked in urgently today at the insistence of her daughter out of concern for a new wound on the lateral part of the plantar right great toe. She has her original postsurgical wound after the amputation of the right second toe, she has a wound on the medial part of the right great toe. The patient is apparently going for an angiogram by Dr. Durwin Nora in 2 weeks time. 6/13; patient presents for follow-up. She has been using bacitracin to the abrasion on the right great toe. She has been using collagen and Keystone antibiotic to the amputation site. She has no issues or complaints today. She denies signs of infection. 6/22; patient presents for follow-up. She states that her abdominal aortogram was canceled due to her renal function. She has been using Keystone antibiotics to the amputation site and Medihoney to the right great toe wound. At the pace of the right great toe she has a slitlike open area that she thinks was caused by the tape from the dressing. 6/29; patient presents for follow-up. She has been using Keystone antibiotics to the amputation site along with collagen. She has been using Medihoney to the right great toe wound. She has no other wounds. She denies signs of infection. 7/13; patient presents for follow-up. She has been using  Keystone antibiotics and collagen to the wound sites. She currently denies signs of infection. She states she is scheduled to see me nephrology next month. 7/20; patient presents for follow-up. She continue Keystone antibiotics and collagen to the wound sites. She has no issues or complaints today. 8/1; patient presents for follow up. She continues to use keystone antibiotics and collagen to the wound sites. She has no issues or complaints today. 8/15; patient presents for follow-up. She has been using Keystone antibiotics and collagen to the wound sites. She followed up with her nephrologist who made medication changes. She is supposed to get a repeat BMP in 2 weeks. Her decrease in renal function was a limiting factor in obtaining an arteriogram for potential intervention for revascularization. She currently denies signs of infection. 9/2; patient presents for follow-up. She has been using Keystone antibiotic and collagen to the wound beds. She has no issues or complaints today. Reports there has been improvement in kidney function however not cleared to have her arteriogram just yet. 10/10; patient presents for follow-up. She has been using Keystone antibiotic and collagen to the wound beds. She reports 2 new wounds 1 to the anterior right lower extremity  and another to the plantar aspect of the right foot. She states that the right plantar foot wound was caused by the home health nurse changing the dressing and causing a skin tear. She is not sure how the right anterior leg wound started. It appears to be from trauma. She denies signs of infection. 10/19; patient presents for follow-up. She has been using Keystone antibiotic and collagen to the right great toe wound. She is been using silver alginate to the right anterior and right plantar foot wound. She has been using Tubigrip to the right lower extremity. The plantar foot wound has healed. She has no issues or complaints today. [name], [name]  ([id number]) [id number] Page 43 of 17 11/3; since the patient was last here she was seen in urgent care apparently for an area on the dorsal aspect of the right fifth toe perhaps over the PIP. I saw a picture of this on the daughter's cell phone. There was slough on this. Urgent care gave him doxycycline. She is also changed the dressing to all wounds back the [facility] and collagen which includes her right leg and left first toe 11/16; patient presents for follow-up. She has a new wound to the left knee. She states she fell. She has been using antibiotic ointment and collagen to this area. She has been using collagen and Keystone antibiotic to the right great toe wound. The anterior right leg wound is healed. She denies signs of infection. 11/30; patient presents for follow-up. The right great toe wound has healed. She has 1 remaining wound to the left knee. She has been using Hydrofera Blue and Medihoney here. 12/19; patient presents for follow-up. Her left knee wound has healed. She has no issues or complaints today. 09/30/2022 Patient's daughter called for an appointment due to increased swelling to her lower extremities bilaterally. Today patient presents with increased swelling to her lower extremities although there is no increased redness or warmth. She was evaluated by her nephrologist who increased her diuretics. Per patient and daughter her swelling has gone down to the leg Over the past several days. She does not have any open wounds. She was advised to elevate her legs and not consume excess salt By her PCP and nephrologist. Patient has compression stockings that she has been using sporadically. READMISSION 03/10/2023 Since her last visit to the clinic, the patient has been hospitalized at least twice, in February with COVID pneumonia and CHF exacerbation. She was discharged to a skilled nursing facility and then readmitted in April with fevers. She was noted to  have pressure ulcers on her heels and sacrum. Per family declined skilled nursing placement upon discharge and she has apparently been residing at home. She has followed with the vascular surgery clinic regarding peripheral artery disease and due to ulcerations on her feet, she ultimately underwent arteriography with balloon angioplasties of the superficial femoral artery, popliteal artery and peroneal artery on the left, along with shockwave ultrasound assisted balloon angioplasty of the popliteal artery and distal SFA. Given her multiple significant medical comorbidities, she is not a candidate for open surgery. She is deemed to be maximally vascularized this procedure, which was performed on 04 March 2023. T oday, she has a stage III pressure ulcer on her left heel and wounds on the PIP joints of her right third and left second toes. No sacral wound is present and the ulcer on her left heel has closed. 03/18/2023: She has a new ulcer on her left great toe. It was  just noticed yesterday and the etiology is unknown. The fat layer is exposed and there is some slough accumulation. The other wounds are all essentially unchanged in size. There is a little bit more granulation tissue on the other toe wounds, but bone is still frankly exposed on both sides. The heel ulcer has thick slough accumulation. 03/26/2023: She has a new wound on the tip of her left third toe. It is black. Neither the patient nor her daughter are aware of how it began. The other wounds are stable. 03/31/2023: The culture that I took from her left third toe returned with MRSA. We contacted the patient's daughter to have her stop levofloxacin and start doxycycline, but she has not yet picked up the new antibiotic. All of her wounds have accumulated slough. Bone remains exposed. She is scheduled to follow- up with vascular surgery tomorrow to evaluate the patency of her revascularization. 04/09/2023: She saw her vascular surgeon and her ABIs  are improved; her TBI's could not be checked due to her bandages. She did finally start taking the prescribed doxycycline. All of her wounds look about the same today. 04/23/2023: No significant changes to any of her wounds aside from being slightly smaller other than the left dorsal great toe wound, which is stable. She has accumulated slough and eschar on all of the surfaces. 04/30/2023: All of the wounds are stable with the exception of the left dorsal great toe wound which now has tendon exposed in addition to bone. There is slough and eschar accumulation on all sites. 05/15/2023: The patient was in the hospital and missed her appointment last week. The wound at the tip of her left third toe is healed. Everything else looks the same. 05/21/2023: No real change to any of the wounds. 05/28/2023: Once again, there has been no real change to any of her wounds except that the wound at the tip of her left great toe is closed. 06/11/2019: No change to any of her wounds. 06/25/2023: The wound on her right great toe nailbed has healed. The other wounds on her toes are unchanged and continue to have bone/joints exposed. The ulcer on her heel is smaller and shallower with very little necrotic tissue present. She fell last week and split her knee open. Apparently she presented to the ED greater than 12 hours after injury and they attempted to loosely suture the site. The sutures have pulled through her skin and they are doing absolutely nothing at this point. The wound is full of nonviable tissue. The muscle fascia is visible. Her PCP has her on doxycycline. 07/08/2023: The wounds on her feet are basically unchanged although the heel ulcer may be slightly smaller. The wound on her left knee looks much better this week. It is cleaner but still has a layer of slough on the surface. I can no longer see the muscle fascia. Patient History Information obtained from Patient, Caregiver, Chart. Family History Cancer -  Siblings, Heart Disease, Stroke - Mother, No family history of Diabetes. Social History Former smoker - quit 2003, Marital Status - Widowed, Alcohol Use - Never, Drug Use - No History, Caffeine Use - Never. Medical History Eyes Patient has history of Cataracts Hematologic/Lymphatic Patient has history of Anemia Respiratory Denies history of Chronic Obstructive Pulmonary Disease (COPD) Cardiovascular Patient has history of Arrhythmia - a-fib, Congestive Heart Failure, Hypotension, Peripheral Venous Disease Musculoskeletal Patient has history of 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 Neurologic Patient has history of Neuropathy Hospitalization/Surgery History - Abdominal  aortogram w/lower extremity. - Amputation toe (Right). - Colonoscopy. - Elbow surgery. - Laparoscopic [name], Meribeth [name] ([id number]) [id number] Page 12 of 17 hysterectomy. Medical A Surgical History Notes nd Hematologic/Lymphatic Thrombocytopenia, Anticoagulant long-term use Respiratory Pulmonary embolus Gastrointestinal Hiatal hernia Endocrine Hyperthyroidism, Hypothyroidism Genitourinary CKD stage III Musculoskeletal arthritis Objective Constitutional Hypertensive, asymptomatic. no acute distress. Vitals Time Taken: 3:15 PM, Height: 64 in, Weight: 157 lbs, BMI: 26.9, Temperature: 98 F, Pulse: 79 bpm, Respiratory Rate: 18 breaths/min, Blood Pressure: 177/49 mmHg. Respiratory Normal work of breathing on supplemental oxygen. General Notes: 07/08/2023: The wounds on her feet are basically unchanged although the heel ulcer may be slightly smaller. The wound on her left knee looks much better this week. It is cleaner but still has a layer of slough on the surface. I can no longer see the muscle fascia. Integumentary (Hair, Skin) Wound #10 status is Open. Original cause of wound was Pressure Injury. The date acquired was: 10/09/2022. The wound has been in treatment 17 weeks.  The wound is located on the Left T Second. The wound measures 0.4cm length x 0.7cm width x 0.6cm depth; 0.22cm^2 area and 0.132cm^3 volume. There is oe bone, joint, and Fat Layer (Subcutaneous Tissue) exposed. There is no tunneling or undermining noted. There is a medium amount of serous drainage noted. The wound margin is distinct with the outline attached to the wound base. There is medium (34-66%) red granulation within the wound bed. There is a small (1- 33%) amount of necrotic tissue within the wound bed including Adherent Slough. The periwound skin appearance had no abnormalities noted for texture. The periwound skin appearance had no abnormalities noted for moisture. The periwound skin appearance had no abnormalities noted for color. Periwound temperature was noted as No Abnormality. The periwound has tenderness on palpation. Wound #11 status is Open. Original cause of wound was Pressure Injury. The date acquired was: 10/09/2022. The wound has been in treatment 17 weeks. The wound is located on the Right T Second. The wound measures 0.4cm length x 0.7cm width x 0.2cm depth; 0.22cm^2 area and 0.044cm^3 volume. There is oe bone, joint, and Fat Layer (Subcutaneous Tissue) exposed. There is no tunneling or undermining noted. There is a medium amount of serosanguineous drainage noted. The wound margin is distinct with the outline attached to the wound base. There is medium (34-66%) pink granulation within the wound bed. There is a small (1-33%) amount of necrotic tissue within the wound bed including Adherent Slough. The periwound skin appearance had no abnormalities noted for texture. The periwound skin appearance had no abnormalities noted for moisture. The periwound skin appearance had no abnormalities noted for color. Periwound temperature was noted as No Abnormality. The periwound has tenderness on palpation. Wound #13 status is Open. Original cause of wound was Gradually Appeared. The date  acquired was: 03/24/2023. The wound has been in treatment 14 weeks. The wound is located on the Left,Dorsal T Great. The wound measures 1.4cm length x 0.9cm width x 0.3cm depth; 0.99cm^2 area and 0.297cm^3 volume. oe There is bone and Fat Layer (Subcutaneous Tissue) exposed. There is no tunneling or undermining noted. There is a medium amount of serous drainage noted. The wound margin is distinct with the outline attached to the wound base. There is medium (34-66%) red granulation within the wound bed. There is a small (1- 33%) amount of necrotic tissue within the wound bed including Adherent Slough and Necrosis of Bone. The periwound skin appearance had no abnormalities noted for texture. The periwound skin  appearance had no abnormalities noted for moisture. The periwound skin appearance had no abnormalities noted for color. Periwound temperature was noted as No Abnormality. The periwound has tenderness on palpation. Wound #16 status is Open. Original cause of wound was Trauma. The date acquired was: 06/18/2023. The wound has been in treatment 1 weeks. The wound is located on the Left Knee. The wound measures 1.3cm length x 4.9cm width x 0.3cm depth; 5.003cm^2 area and 1.501cm^3 volume. There is Fat Layer (Subcutaneous Tissue) exposed. There is no tunneling or undermining noted. There is a medium amount of serosanguineous drainage noted. The wound margin is distinct with the outline attached to the wound base. There is large (67-100%) red granulation within the wound bed. There is a small (1-33%) amount of necrotic tissue within the wound bed including Adherent Slough. The periwound skin appearance had no abnormalities noted for texture. The periwound skin appearance had no abnormalities noted for moisture. The periwound skin appearance had no abnormalities noted for color. Periwound temperature was noted as No Abnormality. The periwound has tenderness on palpation. Wound #9 status is Open. Original  cause of wound was Pressure Injury. The date acquired was: 10/22/2022. The wound has been in treatment 17 weeks. The wound is located on the Left Calcaneus. The wound measures 0.4cm length x 0.3cm width x 0.3cm depth; 0.094cm^2 area and 0.028cm^3 volume. There is Fat Layer (Subcutaneous Tissue) exposed. There is no tunneling or undermining noted. There is a small amount of serous drainage noted. The wound margin is distinct with the outline attached to the wound base. There is medium (34-66%) pink, pale granulation within the wound bed. There is a medium (34-66%) amount of necrotic tissue within the wound bed including Adherent Slough. The periwound skin appearance had no abnormalities noted for moisture. The periwound skin appearance had no abnormalities noted for color. The periwound skin appearance exhibited: Callus. Periwound temperature was noted as No Abnormality. The periwound has tenderness on palpation. 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 There was a Selective/Open Wound Non-Viable Tissue oe Debridement with a total area of 0.22 sq cm performed by Duanne Guess, MD. With the following instrument(s): Curette to remove Non-Viable tissue/material. Material removed includes [facility] after  achieving pain control using Lidocaine 4% Topical Solution. No specimens were taken. A time out was conducted at 15:30, prior to the start of the procedure. A Minimum amount of bleeding was controlled with Pressure. The procedure was tolerated well with a pain level of 0 throughout and a pain level of 0 following the procedure. Post Debridement Measurements: 0.4cm length x 0.7cm width x 0.1cm depth; 0.022cm^3 volume. Post debridement Stage noted as Category/Stage IV. Character of Wound/Ulcer Post Debridement is improved. Post procedure Diagnosis Wound #10: Same as Pre-Procedure Wound #11 Pre-procedure diagnosis of Wound #11 is a Pressure Ulcer located on the Right T Second . There was a Selective/Open Wound Non-Viable Tissue oe Debridement with a total area  of 0.22 sq cm performed by Duanne Guess, MD. With the following instrument(s): Curette to remove Non-Viable tissue/material. Material removed includes [facility] after achieving pain control using Lidocaine 4% Topical Solution. No specimens were taken. A time out was conducted at 15:30, prior to the start of the procedure. A Minimum amount of bleeding was controlled with Pressure. The procedure was tolerated well with a pain level of 0 throughout and a pain level of 0 following the procedure. Post Debridement Measurements: 0.4cm length x 0.7cm width x 0.2cm depth; 0.044cm^3 volume. Post debridement Stage noted as Category/Stage IV. Character of Wound/Ulcer Post Debridement is improved. Post procedure Diagnosis Wound #11: Same as Pre-Procedure Wound #13 Pre-procedure diagnosis of Wound #13 is an Arterial Insufficiency Ulcer located on the Left,Dorsal T Great .Severity of Tissue Pre Debridement is: Bone oe involvement without necrosis. There was a Selective/Open Wound Non-Viable Tissue Debridement with a total area of 0.99 sq cm performed by Duanne Guess, MD. With the following instrument(s): Curette to remove Non-Viable tissue/material.  Material removed includes [facility] after achieving pain control using Lidocaine 4% T opical Solution. No specimens were taken. A time out was conducted at 15:30, prior to the start of the procedure. A Minimum amount of bleeding was controlled with Pressure. The procedure was tolerated well with a pain level of 0 throughout and a pain level of 0 following the procedure. Post Debridement Measurements: 1.4cm length x 0.9cm width x 0.1cm depth; 0.099cm^3 volume. Character of Wound/Ulcer Post Debridement is stable. Severity of Tissue Post Debridement is: Bone involvement without necrosis. Post procedure Diagnosis Wound #13: Same as Pre-Procedure Wound #16 Pre-procedure diagnosis of Wound #16 is an Abrasion located on the Left Knee . There was a Excisional Skin/Subcutaneous Tissue Debridement with a total area of 5 sq cm performed by Duanne Guess, MD. With the following instrument(s): Curette to remove Viable and Non-Viable tissue/material. Material removed includes Subcutaneous Tissue and Slough and after achieving pain control using Lidocaine 4% T opical Solution. No specimens were taken. A time out was conducted at 15:30, prior to the start of the procedure. A Minimum amount of bleeding was controlled with Pressure. The procedure was tolerated well with a pain level of 0 throughout and a pain level of 0 following the procedure. Post Debridement Measurements: 1.3cm length x 4.9cm width x 0.3cm depth; 1.501cm^3 volume. Character of Wound/Ulcer Post Debridement is improved. Post procedure Diagnosis Wound #16: Same as Pre-Procedure Wound #9 Pre-procedure diagnosis of Wound #9 is a Pressure Ulcer located on the Left Calcaneus . There was a Excisional Skin/Subcutaneous Tissue Debridement with a total area of 0.09 sq cm performed by Duanne Guess, MD. With the following instrument(s): Curette to remove Viable and Non-Viable tissue/material. Material removed includes Subcutaneous Tissue, Slough, and  Skin: Epidermis after achieving pain control using Lidocaine 4% T opical Solution. No specimens were taken. A time out was conducted at 15:30, prior to the start of the procedure. A Minimum amount of bleeding was controlled with Pressure. The procedure was tolerated well with a pain level of 0 throughout and a pain level of 0 following the procedure. Post Debridement Measurements: 0.4cm length x 0.3cm width x 0.3cm depth; 0.028cm^3 volume. Post debridement Stage noted as Category/Stage II. Character of Wound/Ulcer Post Debridement is improved. Post procedure Diagnosis Wound #9: Same as Pre-Procedure Plan Follow-up Appointments: Return Appointment in 2 weeks. - Dr. Lady Gary - room 1 Wed 11/13 @ 2:45 pm Anesthetic: (In clinic) Topical Lidocaine 4% applied to wound bed Bathing/ Shower/ Hygiene: May shower  and wash wound with soap and water. Additional Orders / Instructions: Follow Nutritious Diet - add in protein shakes every day to diet - recommend premier protein 500 mg x3 a day vitamin C, zinc 30-50 mg per day Home Health: No change in wound care orders this week; continue Home Health for wound care. May utilize formulary equivalent dressing for wound treatment orders unless otherwise specified. Other Home Health Orders/Instructions: [facility] [place], Ashiyah [place] ([id number]) [id number] Page 14 of 17 WOUND #10: - T Second oe Wound Laterality: Left Cleanser: Soap and Water 1 x Per Day/30 Days Discharge Instructions: May shower and wash wound with dial antibacterial soap and water prior to dressing change. Cleanser: Wound Cleanser 1 x Per Day/30 Days Discharge Instructions: Cleanse the wound with wound cleanser prior to applying a clean dressing using gauze sponges, not tissue or cotton balls. Peri-Wound Care: Sween Lotion (Moisturizing lotion) 1 x Per Day/30 Days Discharge Instructions: Apply moisturizing lotion as directed Prim Dressing: Promogran Prisma Matrix,  4.34 (sq in) (silver collagen) (Dispense As Written) 1 x Per Day/30 Days ary Discharge Instructions: Moisten collagen with saline or hydrogel Secondary Dressing: Woven Gauze Sponge, Non-Sterile 4x4 in (Generic) 1 x Per Day/30 Days Discharge Instructions: Apply over primary dressing as directed. Secured With: [employer], 4.5x3.1 (in/yd) (Generic) 1 x Per Day/30 Days Discharge Instructions: Secure with Kerlix as directed. Secured With: 72M Medipore H Soft Cloth Surgical T ape, 4 x 10 (in/yd) (Generic) 1 x Per Day/30 Days Discharge Instructions: Secure with tape as directed. WOUND #11: - T Second Wound Laterality: Right oe Cleanser: Soap and Water 1 x Per Day/30 Days Discharge Instructions: May shower and wash wound with dial antibacterial soap and water prior to dressing change. Cleanser: Wound Cleanser 1 x Per Day/30 Days Discharge Instructions: Cleanse the wound with wound cleanser prior to applying a clean dressing using gauze sponges, not tissue or cotton balls. Prim Dressing: Promogran Prisma Matrix, 4.34 (sq in) (silver collagen) (Dispense As Written) 1 x Per Day/30 Days ary Discharge Instructions: Moisten collagen with saline or hydrogel Secondary Dressing: Woven Gauze Sponge, Non-Sterile 4x4 in (Generic) 1 x Per Day/30 Days Discharge Instructions: Apply over primary dressing as directed. Secured With: Insurance underwriter, Sterile 2x75 (in/in) (Generic) 1 x Per Day/30 Days Discharge Instructions: Secure with stretch gauze as directed. Secured With: 72M Medipore H Soft Cloth Surgical T ape, 4 x 10 (in/yd) (Generic) 1 x Per Day/30 Days Discharge Instructions: Secure with tape as directed. WOUND #13: - T Great Wound Laterality: Dorsal, Left oe Cleanser: Soap and Water 1 x Per Day/30 Days Discharge Instructions: May shower and wash wound with dial antibacterial soap and water prior to dressing change. Cleanser: Wound Cleanser 1 x Per Day/30 Days Discharge  Instructions: Cleanse the wound with wound cleanser prior to applying a clean dressing using gauze sponges, not tissue or cotton balls. Prim Dressing: Promogran Prisma Matrix, 4.34 (sq in) (silver collagen) (Dispense As Written) 1 x Per Day/30 Days ary Discharge Instructions: Moisten collagen with saline or hydrogel Secondary Dressing: Woven Gauze Sponge, Non-Sterile 4x4 in (Generic) 1 x Per Day/30 Days Discharge Instructions: Apply over primary dressing as directed. Secured With: 72M Medipore H Soft Cloth Surgical T ape, 4 x 10 (in/yd) (Generic) 1 x Per Day/30 Days Discharge Instructions: Secure with tape as directed. WOUND #16: - Knee Wound Laterality: Left Cleanser: Normal Saline (Generic) 3 x Per Week/30 Days Discharge Instructions: Cleanse the wound with Normal Saline prior to applying a clean dressing  using gauze sponges, not tissue or cotton balls. Prim Dressing: Hydrofera Blue Ready Transfer Foam, 4x5 (in/in) 3 x Per Week/30 Days ary Discharge Instructions: Apply to wound bed as instructed Secondary Dressing: Zetuvit Plus Silicone Border Dressing 5x5 (in/in) 3 x Per Week/30 Days Discharge Instructions: Apply silicone border over primary dressing as directed. WOUND #9: - Calcaneus Wound Laterality: Left Cleanser: Soap and Water 1 x Per Day/30 Days Discharge Instructions: May shower and wash wound with dial antibacterial soap and water prior to dressing change. Cleanser: Wound Cleanser 1 x Per Day/30 Days Discharge Instructions: Cleanse the wound with wound cleanser prior to applying a clean dressing using gauze sponges, not tissue or cotton balls. Prim Dressing: Promogran Prisma Matrix, 4.34 (sq in) (silver collagen) (Dispense As Written) 1 x Per Day/30 Days ary Discharge Instructions: Moisten collagen with saline or hydrogel Secondary Dressing: ALLEVYN Heel 4 1/2in x 5 1/2in / 10.5cm x 13.5cm (Generic) 1 x Per Day/30 Days Discharge Instructions: Apply over primary dressing as  directed. Secondary Dressing: Woven Gauze Sponge, Non-Sterile 4x4 in (Generic) 1 x Per Day/30 Days Discharge Instructions: Apply over primary dressing as directed. Secured With: [employer], 4.5x3.1 (in/yd) (Generic) 1 x Per Day/30 Days Discharge Instructions: Secure with Kerlix as directed. Secured With: 30M Medipore H Soft Cloth Surgical T ape, 4 x 10 (in/yd) (Generic) 1 x Per Day/30 Days Discharge Instructions: Secure with tape as directed. 07/08/2023: The wounds on her feet are basically unchanged although the heel ulcer may be slightly smaller. The wound on her left knee looks much better this week. It is cleaner but still has a layer of slough on the surface. I can no longer see the muscle fascia. I used a curette to debride slough from each of the wounds on her toes, slough and subcutaneous tissue from the heel and slough and subcutaneous tissue from the knee. Continue Prisma silver collagen to all of the toe wounds and Hydrofera Blue to the knee wound. Continue to pack the collagen into the heel ulcer. Follow-up in 2 weeks. Electronic Signature(s) Signed: 07/08/2023 4:57:41 PM By: Duanne Guess MD FACS Entered By: Duanne Guess on 07/08/2023 13:57:41 HxROS Details -------------------------------------------------------------------------------- Modena Morrow ([id number]) [id number] Page 15 of 17 Patient Name: Date of Service: ANNETTA, STENTZ 07/08/2023 2:45 PM Medical Record Number: [id number] Patient Account Number: [id number] Date of Birth/Sex: Treating RN: 08/01/1928 (89 y.o. F) Primary Care Provider: [name] Other Clinician: Referring Provider: Treating Provider/Extender: Tiajuana Amass in Treatment: 17 Information Obtained From Patient Caregiver Chart Eyes Medical History: Positive for: Cataracts Hematologic/Lymphatic Medical History: Positive for: Anemia Past Medical History Notes: Thrombocytopenia,  Anticoagulant long-term use Respiratory Medical History: Negative for: Chronic Obstructive Pulmonary Disease (COPD) Past Medical History Notes: Pulmonary embolus Cardiovascular Medical History: Positive for: Arrhythmia - a-fib; Congestive Heart Failure; Hypotension; Peripheral Venous Disease Gastrointestinal Medical History: Past Medical History Notes: Hiatal hernia Endocrine Medical History: Past Medical History Notes: Hyperthyroidism, Hypothyroidism Genitourinary Medical History: Past Medical History Notes: CKD stage III Musculoskeletal Medical History: Positive for: 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 Past Medical History Notes: arthritis Neurologic Medical History: Positive for: Neuropathy HBO Extended History Items Eyes: Cataracts Immunizations Pneumococcal Vaccine: Received Pneumococcal Vaccination: No Implantable Devices None Hospitalization / Surgery History Type of Hospitalization/Surgery Piggott, [name] ([id number]) [id number] Page 16 of 17 Abdominal aortogram w/lower extremity Amputation toe (Right) Colonoscopy Elbow surgery Laparoscopic hysterectomy Family and Social History Cancer: Yes - Siblings; Diabetes: No; Heart Disease: Yes; Stroke: Yes - Mother; Former  smoker - quit 2003; Marital Status - Widowed; Alcohol Use: Never; Drug Use: No History; Caffeine Use: Never; Financial Concerns: No; Food, Clothing or Shelter Needs: No; Support System Lacking: No; Transportation Concerns: No Psychologist, prison and probation services) Signed: 07/08/2023 5:50:32 PM By: Duanne Guess MD FACS Entered By: Duanne Guess on 07/08/2023 13:50:27 -------------------------------------------------------------------------------- SuperBill Details Patient Name: Date of Service: Megan Reap. 07/08/2023 Medical Record Number: [id number] Patient Account Number: [id number] Date of Birth/Sex: Treating RN: Nov 28, 1927 (89 y.o. 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 evidence of necrosis I73.9 Peripheral vascular disease, unspecified I50.32 Chronic diastolic (congestive) heart failure N18.32 Chronic kidney disease, stage 3b I87.2 Venous insufficiency (chronic) (peripheral) Facility Procedures : CPT4 Code Description: [id number] 11042 - DEB SUBQ TISSUE 20 SQ CM/< ICD-10 Diagnosis Description L89.623 Pressure ulcer of left heel, stage 3 L97.825 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 involvement Modifier: without evidence o Quantity: 1 f necrosis : CPT4 Code Description: [id number] - DEBRIDE WOUND 1ST 20 SQ CM OR < ICD-10 Diagnosis Description 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 witho L97.526 Non-pressure chronic ulcer of other part of left foot  with bone involvement withou Modifier: ut evidence of necr t evidence of necro Quantity: 1 osis sis Physician Procedures : CPT4 Code Description Modifier [id number] - WC PHYS LEVEL 4 - EST PT ICD-10 Diagnosis Description L89.623 Pressure ulcer of left heel, 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 without evidence of  necr 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 without evidence of necro L97.825 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 involvement  without evidence o Quantity: 1 osis sis f necrosis : [id number] 11042 - WC PHYS SUBQ TISS 20 SQ CM ICD-10 Diagnosis Description L89.623 Pressure ulcer of left heel, stage 3 Melone, Annalie [name] ([id number]) [id number] L97.825 Non-pressure chronic ulcer of other part of left lower leg  with muscle involvement without evidence of n Quantity: 1 f Page 17 of 17 ecrosis : [id number] 97597 - WC PHYS DEBR WO ANESTH 20 SQ CM ICD-10 Diagnosis Description 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

## 2023-07-10 NOTE — Progress Notes (Signed)
SHIVALI, QUACKENBUSH ([id number]) [id number] Page 1 of 14 Visit Report for 07/08/2023 Arrival Information Details Patient Name: Date of Service: [name], [name] 07/08/2023 2:45 PM Medical Record Number: [id number] Patient Account Number: [id number] Date of Birth/Sex: Treating RN: [birth year] (89 y.o. [name], Megan Primary Care [name]: [name] Other Clinician: Referring Caterin Tabares: Treating Koi Yarbro/Extender: Tiajuana Amass in Treatment: 17 Visit Information History Since Last Visit Added or deleted any medications: Yes Patient Arrived: Wheel Chair Any new allergies or adverse reactions: No Arrival Time: 15:17 Had a fall or experienced change in No Accompanied By: daughter activities of daily living that may affect Transfer Assistance: None risk of falls: Patient Identification Verified: Yes Signs or symptoms of abuse/neglect since last visito No Secondary Verification Process Completed: Yes Hospitalized since last visit: No Patient Requires Transmission-Based Precautions: No Implantable device outside of the clinic excluding No Patient Has Alerts: Yes cellular tissue based products placed in the center Patient Alerts: ABI R: 0.48 L: 0.46 2/24 since last visit: TBI R: 0.39 L: 0.21 2/24 Has Dressing in Place as Prescribed: Yes Pain Present Now: No Electronic Signature(s) Signed: 07/09/2023 5:49:41 PM By: Zenaida Deed RN, BSN Entered By: Zenaida Deed on 07/08/2023 12:18:43 -------------------------------------------------------------------------------- Encounter Discharge Information Details Patient Name: Date of Service: Megan Reap. 07/08/2023 2:45 PM Medical Record Number: [id number] Patient Account Number: [id number] Date of Birth/Sex: Treating RN: [birth year] (89 y.o. [name] Primary Care [name]: [name] Other Clinician: Referring Murray Guzzetta: Treating [name]/Extender: Claudie Leach Weeks in Treatment: 17 Encounter Discharge Information Items Post Procedure Vitals Discharge Condition: Stable Temperature (F): 98 Ambulatory Status: Wheelchair Pulse (bpm): 79 Discharge Destination: Home Respiratory Rate (breaths/min): 18 Transportation: Private Auto Blood Pressure (mmHg): 177/49 Accompanied By: daughter Schedule Follow-up Appointment: Yes Clinical Summary of Care: Patient Declined Electronic Signature(s) Signed: 07/09/2023 5:49:41 PM By: Zenaida Deed RN, BSN Entered By: Zenaida Deed on 07/08/2023 13:50:13 [name], [name] ([id number]) [id number] Page 2 of 14 -------------------------------------------------------------------------------- Lower Extremity Assessment Details Patient Name: Date of Service: Megan, [name] 07/08/2023 2:45 PM Medical Record Number: [id number] Patient Account Number: [id number] Date of Birth/Sex: Treating RN: 07/17/1928 (89 y.o. [name] Primary Care Tonetta Napoles: [name] Other Clinician: Referring Bexleigh Theriault: Treating [name]/Extender: Claudie Leach Weeks in Treatment: 17 Edema Assessment Assessed: [Left: No] [Right: No] Edema: [Left: Yes] [Right: Yes] Calf Left: Right: Point of Measurement: From Medial Instep 39 cm 36.5 cm Ankle Left: Right: Point of Measurement: From Medial Instep 24.5 cm 24 cm Vascular Assessment Pulses: Dorsalis Pedis Palpable: [Left:Yes] [Right:Yes] Extremity colors, hair growth, and conditions: Extremity Color: [Left:Hyperpigmented] [Right:Hyperpigmented] Hair Growth on Extremity: [Left:No] [Right:No] Temperature of Extremity: [Left:Warm < 3 seconds] [Right:Warm < 3 seconds] Electronic Signature(s) Signed: 07/09/2023 5:49:41 PM By: Zenaida Deed RN, BSN Entered By: Zenaida Deed on 07/08/2023 12:20:53 -------------------------------------------------------------------------------- Multi Wound Chart Details Patient Name: Date of  Service: Megan Reap. 07/08/2023 2:45 PM Medical Record Number: [id number] Patient Account Number: [id number] Date of Birth/Sex: Treating RN: 07/10/1928 (89 y.o. F) Primary Care Dezarai Prew: [name] Other Clinician: Referring Madalyne Husk: Treating [name]/Extender: Claudie Leach Weeks in Treatment: 17 Vital Signs Height(in): 64 Pulse(bpm): 79 Weight(lbs): 157 Blood Pressure(mmHg): 177/49 Body Mass Index(BMI): 26.9 Temperature(F): 98 Respiratory Rate(breaths/min): 18 [10:Photos:] [[id number] Page 3 of 14] Left T Second oe Right T Second oe Left, Dorsal T Great oe Wound Location: Pressure Injury Pressure Injury Gradually Appeared Wounding Event: Pressure Ulcer Pressure Ulcer Arterial Insufficiency Ulcer Primary Etiology: Cataracts, Anemia, Arrhythmia,  Cataracts, Anemia, Arrhythmia, Cataracts, Anemia, Arrhythmia, Comorbid History: Congestive Heart Failure, Congestive Heart Failure, Congestive Heart Failure, Hypotension, Peripheral Venous Hypotension, Peripheral Venous Hypotension, Peripheral Venous Disease, Osteomyelitis, Neuropathy Disease, Osteomyelitis, Neuropathy Disease, Osteomyelitis, Neuropathy 10/09/2022 10/09/2022 03/24/2023 Date Acquired: 17 17 14  Weeks of Treatment: Open Open Open Wound Status: No No No Wound Recurrence: 0.4x0.7x0.6 0.4x0.7x0.2 1.4x0.9x0.3 Measurements L x W x D (cm) 0.22 0.22 0.99 A (cm) : rea 0.132 0.044 0.297 Volume (cm) : 68.90% 68.90% -1153.20% % Reduction in A rea: -85.90% 38.00% -3612.50% % Reduction in Volume: Category/Stage IV Category/Stage IV Full Thickness With Exposed Support Classification: Structures Medium Medium Medium Exudate A mount: Serous Serosanguineous Serous Exudate Type: amber red, brown amber Exudate Color: Distinct, outline attached Distinct, outline attached Distinct, outline attached Wound Margin: Medium (34-66%) Medium (34-66%) Medium (34-66%) Granulation  A mount: Red Pink Red Granulation Quality: Small (1-33%) Small (1-33%) Small (1-33%) Necrotic A mount: Fat Layer (Subcutaneous Tissue): Yes Fat Layer (Subcutaneous Tissue): Yes Fat Layer (Subcutaneous Tissue): Yes Exposed Structures: Joint: Yes Joint: Yes Bone: Yes Bone: Yes Bone: Yes Fascia: No Fascia: No Fascia: No Tendon: No Tendon: No Tendon: No Muscle: No Muscle: No Muscle: No Joint: No None None Small (1-33%) Epithelialization: Debridement - Selective/Open Wound Debridement - Selective/Open Wound Debridement - Selective/Open Wound Debridement: Pre-procedure Verification/Time Out 15:30 15:30 15:30 Taken: Lidocaine 4% Topical Solution Lidocaine 4% Topical Solution Lidocaine 4% Topical Solution Pain Control: [facility] Tissue Debrided: Non-Viable Tissue Non-Viable Tissue Non-Viable Tissue Level: 0.22 0.22 0.99 Debridement A (sq cm): rea Curette Curette Curette Instrument: Minimum Minimum Minimum Bleeding: Pressure Pressure Pressure Hemostasis A chieved: 0 0 0 Procedural Pain: 0 0 0 Post Procedural Pain: Procedure was tolerated well Procedure was tolerated well Procedure was tolerated well Debridement Treatment Response: 0.4x0.7x0.1 0.4x0.7x0.2 1.4x0.9x0.1 Post Debridement Measurements L x W x D (cm) 0.022 0.044 0.099 Post Debridement Volume: (cm) Category/Stage IV Category/Stage IV N/A Post Debridement Stage: No Abnormalities Noted No Abnormalities Noted No Abnormalities Noted Periwound Skin Texture: Dry/Scaly: Yes Dry/Scaly: Yes No Abnormalities Noted Periwound Skin Moisture: No Abnormalities Noted Rubor: No Erythema: No Periwound Skin Color: No Abnormality No Abnormality No Abnormality Temperature: Yes Yes Yes Tenderness on Palpation: Debridement Debridement Debridement Procedures Performed: Wound Number: [id number] N/A Photos: N/A Left Knee Left Calcaneus N/A Wound Location: Trauma Pressure Injury N/A Wounding Event: Abrasion  Pressure Ulcer N/A Primary Etiology: Cataracts, Anemia, Arrhythmia, Cataracts, Anemia, Arrhythmia, N/A Comorbid History: Congestive Heart Failure, Congestive Heart Failure, Hypotension, Peripheral Venous Hypotension, Peripheral Venous Disease, Osteomyelitis, Neuropathy Disease, Osteomyelitis, Neuropathy 06/18/2023 10/22/2022 N/A Date Acquired: 1 17 N/A Weeks of Treatment: Open Open N/A Wound Status: No No N/A Wound Recurrence: 1.3x4.9x0.3 0.4x0.3x0.3 N/A Measurements L x W x D (cm) [name], [name] ([id number]) [id number] Page 4 of 14 5.003 0.094 N/A A (cm) : rea 1.501 0.028 N/A Volume (cm) : 64.60% 71.50% N/A % Reduction in Area: 64.60% 71.70% N/A % Reduction in Volume: Full Thickness Without Exposed Category/Stage II N/A Classification: Support Structures Medium Small N/A Exudate A mount: Serosanguineous Serous N/A Exudate Type: red, brown amber N/A Exudate Color: Distinct, outline attached Distinct, outline attached N/A Wound Margin: Large (67-100%) Medium (34-66%) N/A Granulation A mount: Red Pink, Pale N/A Granulation Quality: Small (1-33%) Medium (34-66%) N/A Necrotic A mount: Fat Layer (Subcutaneous Tissue): Yes Fat Layer (Subcutaneous Tissue): Yes N/A Exposed Structures: Fascia: No Fascia: No Tendon: No Tendon: No Muscle: No Muscle: No Joint: No Joint: No Bone: No Bone: No Small (1-33%) Small (1-33%) N/A Epithelialization: Debridement -  Excisional Debridement - Excisional N/A Debridement: Pre-procedure Verification/Time Out 15:30 15:30 N/A Taken: Lidocaine 4% Topical Solution Lidocaine 4% Topical Solution N/A Pain Control: Subcutaneous, Slough Subcutaneous, Slough N/A Tissue Debrided: Skin/Subcutaneous Tissue Skin/Subcutaneous Tissue N/A Level: 5 0.09 N/A Debridement A (sq cm): rea Curette Curette N/A Instrument: Minimum Minimum N/A Bleeding: Pressure Pressure N/A Hemostasis A chieved: 0 0 N/A Procedural  Pain: 0 0 N/A Post Procedural Pain: Procedure was tolerated well Procedure was tolerated well N/A Debridement Treatment Response: 1.3x4.9x0.3 0.4x0.3x0.3 N/A Post Debridement Measurements L x W x D (cm) 1.501 0.028 N/A Post Debridement Volume: (cm) N/A Category/Stage II N/A Post Debridement Stage: No Abnormalities Noted Callus: Yes N/A Periwound Skin Texture: No Abnormalities Noted Dry/Scaly: Yes N/A Periwound Skin Moisture: Ecchymosis: No No Abnormalities Noted N/A Periwound Skin Color: No Abnormality No Abnormality N/A Temperature: Yes Yes N/A Tenderness on Palpation: Debridement Debridement N/A Procedures Performed: Treatment Notes Electronic Signature(s) Signed: 07/08/2023 4:47:53 PM By: Duanne Guess MD FACS Entered By: Duanne Guess on 07/08/2023 13:47:53 -------------------------------------------------------------------------------- Multi-Disciplinary Care Plan Details Patient Name: Date of Service: Megan Reap. 07/08/2023 2:45 PM Medical Record Number: [id number] Patient Account Number: [id number] Date of Birth/Sex: Treating RN: 07/08/1928 (89 y.o. [name] Primary Care Jaleyah Longhi: [name] Other Clinician: Referring Pier Bosher: Treating Sharie Amorin/Extender: Tiajuana Amass in Treatment: 17 Multidisciplinary Care Plan reviewed with physician Active Inactive Abuse / Safety / Falls / Self Care Management Nursing Diagnoses: Impaired home maintenance Impaired physical mobility Potential for falls Battin, [name] ([id number]) [id number] Page 5 of 14 Goals: Patient/caregiver will verbalize/demonstrate measure taken to improve self care Date Initiated: 03/10/2023 Target Resolution Date: 08/07/2023 Goal Status: Active Patient/caregiver will verbalize/demonstrate measures taken to improve the patient's personal safety Date Initiated: 03/10/2023 Target Resolution Date: 08/07/2023 Goal Status:  Active Interventions: Assess fall risk on admission and as needed Assess: immobility, friction, shearing, incontinence upon admission and as needed Provide education on fall prevention Notes: pt fell 06/18/23 injuring left knee Wound/Skin Impairment Nursing Diagnoses: Impaired tissue integrity Knowledge deficit related to ulceration/compromised skin integrity Goals: Patient/caregiver will verbalize understanding of skin care regimen Date Initiated: 03/10/2023 Target Resolution Date: 08/07/2023 Goal Status: Active Interventions: Assess patient/caregiver ability to obtain necessary supplies Assess patient/caregiver ability to perform ulcer/skin care regimen upon admission and as needed Assess ulceration(s) every visit Treatment Activities: Skin care regimen initiated : 03/10/2023 Topical wound management initiated : 03/10/2023 Notes: Electronic Signature(s) Signed: 07/09/2023 5:49:41 PM By: Zenaida Deed RN, BSN Entered By: Zenaida Deed on 07/08/2023 12:28:12 -------------------------------------------------------------------------------- Pain Assessment Details Patient Name: Date of Service: Megan Reap. 07/08/2023 2:45 PM Medical Record Number: [id number] Patient Account Number: [id number] Date of Birth/Sex: Treating RN: Sep 12, 1927 (89 y.o. [name] Primary Care Janari Gagner: [name] Other Clinician: Referring Koula Venier: Treating Genea Rheaume/Extender: Claudie Leach Weeks in Treatment: 17 Active Problems Location of Pain Severity and Description of Pain Patient Has Paino Yes Site Locations Pain Location: ADREENA, WILLITS ([id number]) [id number] Page 6 of 14 Pain Location: Pain in Ulcers With Dressing Change: Yes Duration of the Pain. Constant / Intermittento Intermittent Rate the pain. Current Pain Level: 0 Worst Pain Level: 3 Least Pain Level: 0 Character of Pain Describe the Pain: Tender Pain Management and  Medication Current Pain Management: Rest: Yes Is the Current Pain Management Adequate: Adequate How does your wound impact your activities of daily livingo Sleep: No Bathing: No Appetite: No Relationship With Others: No Bladder Continence: No Emotions: No Bowel Continence: No Work: No Toileting: No Drive: No  Dressing: No Hobbies: No Electronic Signature(s) Signed: 07/09/2023 5:49:41 PM By: Zenaida Deed RN, BSN Entered By: Zenaida Deed on 07/08/2023 12:19:42 -------------------------------------------------------------------------------- Patient/Caregiver Education Details Patient Name: Date of Service: Megan Reap 10/30/2024andnbsp2:45 PM Medical Record Number: [id number] Patient Account Number: [id number] Date of Birth/Gender: Treating RN: June 15, 1928 (89 y.o. [name] Primary Care Physician: [name] Other Clinician: Referring Physician: Treating Physician/Extender: Tiajuana Amass in Treatment: 17 Education Assessment Education Provided To: Patient Education Topics Provided Safety: Methods: Explain/Verbal Responses: Reinforcements needed, State content correctly Tissue Oxygenation: Methods: Explain/Verbal Responses: Reinforcements needed, State content correctly Wound/Skin Impairment: Methods: Explain/Verbal Responses: Reinforcements needed, State content correctly DYANN, [place] ([id number]) [id number] Page 7 of 14 Electronic Signature(s) Signed: 07/09/2023 5:49:41 PM By: Zenaida Deed RN, BSN Entered By: Zenaida Deed on 07/08/2023 12:28:42 -------------------------------------------------------------------------------- Wound Assessment Details Patient Name: Date of Service: Megan Reap. 07/08/2023 2:45 PM Medical Record Number: [id number] Patient Account Number: [id number] Date of Birth/Sex: Treating RN: August 28, 1928 (89 y.o. F) Primary Care Ahnesty Finfrock: [name] Other  Clinician: Referring Candee Hoon: Treating Jermane Brayboy/Extender: Claudie Leach Weeks in Treatment: 17 Wound Status Wound Number: 10 Primary Pressure Ulcer Etiology: Wound Location: Left T Second oe Wound Open Wounding Event: Pressure Injury Status: Date Acquired: 10/09/2022 Comorbid Cataracts, Anemia, Arrhythmia, Congestive Heart Failure, Weeks Of Treatment: 17 History: Hypotension, Peripheral Venous Disease, Osteomyelitis, Clustered Wound: No Neuropathy Photos Wound Measurements Length: (cm) 0.4 Width: (cm) 0.7 Depth: (cm) 0.6 Area: (cm) 0.22 Volume: (cm) 0.132 % Reduction in Area: 68.9% % Reduction in Volume: -85.9% Epithelialization: None Tunneling: No Undermining: No Wound Description Classification: Category/Stage IV Wound Margin: Distinct, outline attached Exudate Amount: Medium Exudate Type: Serous Exudate Color: amber Foul Odor After Cleansing: No Slough/Fibrino No Wound Bed Granulation Amount: Medium (34-66%) Exposed Structure Granulation Quality: Red Fascia Exposed: No Necrotic Amount: Small (1-33%) Fat Layer (Subcutaneous Tissue) Exposed: Yes Necrotic Quality: Adherent Slough Tendon Exposed: No Muscle Exposed: No Joint Exposed: Yes Bone Exposed: Yes Periwound Skin Texture Texture Color No Abnormalities Noted: Yes No Abnormalities Noted: Yes Moisture Temperature / Pain No Abnormalities Noted: Yes Temperature: No Abnormality Tenderness on Palpation: Yes [name], [name] ([id number]) [id number] Page 8 of 14 Treatment Notes Wound #10 (Toe Second) Wound Laterality: Left Cleanser Soap and Water Discharge Instruction: May shower and wash wound with dial antibacterial soap and water prior to dressing change. Wound Cleanser Discharge Instruction: Cleanse the wound with wound cleanser prior to applying a clean dressing using gauze sponges, not tissue or cotton balls. Peri-Wound Care Sween Lotion (Moisturizing  lotion) Discharge Instruction: Apply moisturizing lotion as directed Topical Primary Dressing Promogran Prisma Matrix, 4.34 (sq in) (silver collagen) Discharge Instruction: Moisten collagen with saline or hydrogel Secondary Dressing Woven Gauze Sponge, Non-Sterile 4x4 in Discharge Instruction: Apply over primary dressing as directed. Secured With [employer], 4.5x3.1 (in/yd) Discharge Instruction: Secure with Kerlix as directed. 30M Medipore H Soft Cloth Surgical T ape, 4 x 10 (in/yd) Discharge Instruction: Secure with tape as directed. Compression Wrap Compression Stockings Add-Ons Electronic Signature(s) Signed: 07/09/2023 5:49:41 PM By: Zenaida Deed RN, BSN Entered By: Zenaida Deed on 07/08/2023 12:23:45 -------------------------------------------------------------------------------- Wound Assessment Details Patient Name: Date of Service: Megan Reap. 07/08/2023 2:45 PM Medical Record Number: [id number] Patient Account Number: [id number] Date of Birth/Sex: Treating RN: 1928-07-24 (89 y.o. F) Primary Care Amyla Heffner: [name] Other Clinician: Referring Vasil Juhasz: Treating Josede Cicero/Extender: Claudie Leach Weeks in Treatment: 17 Wound Status Wound Number: 11 Primary Pressure Ulcer Etiology: Wound Location: Right T  Second oe Wound Open Wounding Event: Pressure Injury Status: Date Acquired: 10/09/2022 Comorbid Cataracts, Anemia, Arrhythmia, Congestive Heart Failure, Weeks Of Treatment: 17 History: Hypotension, Peripheral Venous Disease, Osteomyelitis, Clustered Wound: No Neuropathy Photos [name], [name] ([id number]) [id number] Page 9 of 14 Wound Measurements Length: (cm) 0.4 Width: (cm) 0.7 Depth: (cm) 0.2 Area: (cm) 0.22 Volume: (cm) 0.044 % Reduction in Area: 68.9% % Reduction in Volume: 38% Epithelialization: None Tunneling: No Undermining: No Wound Description Classification: Category/Stage IV Wound  Margin: Distinct, outline attached Exudate Amount: Medium Exudate Type: Serosanguineous Exudate Color: red, brown Foul Odor After Cleansing: No Slough/Fibrino Yes Wound Bed Granulation Amount: Medium (34-66%) Exposed Structure Granulation Quality: Pink Fascia Exposed: No Necrotic Amount: Small (1-33%) Fat Layer (Subcutaneous Tissue) Exposed: Yes Necrotic Quality: Adherent Slough Tendon Exposed: No Muscle Exposed: No Joint Exposed: Yes Bone Exposed: Yes Periwound Skin Texture Texture Color No Abnormalities Noted: Yes No Abnormalities Noted: Yes Moisture Temperature / Pain No Abnormalities Noted: Yes Temperature: No Abnormality Tenderness on Palpation: Yes Treatment Notes Wound #11 (Toe Second) Wound Laterality: Right Cleanser Soap and Water Discharge Instruction: May shower and wash wound with dial antibacterial soap and water prior to dressing change. Wound Cleanser Discharge Instruction: Cleanse the wound with wound cleanser prior to applying a clean dressing using gauze sponges, not tissue or cotton balls. Peri-Wound Care Topical Primary Dressing Promogran Prisma Matrix, 4.34 (sq in) (silver collagen) Discharge Instruction: Moisten collagen with saline or hydrogel Secondary Dressing Woven Gauze Sponge, Non-Sterile 4x4 in Discharge Instruction: Apply over primary dressing as directed. Secured With [employer] Stretch Gauze Bandage, Sterile 2x75 (in/in) Discharge Instruction: Secure with stretch gauze as directed. 61M Medipore H Soft Cloth Surgical T ape, 4 x 10 (in/yd) Discharge Instruction: Secure with tape as directed. Compression Wrap [name], [name] ([id number]) [id number] Page 10 of 14 Compression Stockings Add-Ons Electronic Signature(s) Signed: 07/09/2023 5:49:41 PM By: Zenaida Deed RN, BSN Entered By: Zenaida Deed on 07/08/2023 12:24:33 -------------------------------------------------------------------------------- Wound  Assessment Details Patient Name: Date of Service: Megan Reap. 07/08/2023 2:45 PM Medical Record Number: [id number] Patient Account Number: [id number] Date of Birth/Sex: Treating RN: 1927-12-29 (89 y.o. F) Primary Care Sheddrick Lattanzio: [name] Other Clinician: Referring Child Campoy: Treating Tani Virgo/Extender: Claudie Leach Weeks in Treatment: 17 Wound Status Wound Number: 13 Primary Arterial Insufficiency Ulcer Etiology: Wound Location: Left, Dorsal T Great oe Wound Open Wounding Event: Gradually Appeared Status: Date Acquired: 03/24/2023 Comorbid Cataracts, Anemia, Arrhythmia, Congestive Heart Failure, Weeks Of Treatment: 14 History: Hypotension, Peripheral Venous Disease, Osteomyelitis, Clustered Wound: No Neuropathy Photos Wound Measurements Length: (cm) 1.4 Width: (cm) 0.9 Depth: (cm) 0.3 Area: (cm) 0.99 Volume: (cm) 0.297 % Reduction in Area: -1153.2% % Reduction in Volume: -3612.5% Epithelialization: Small (1-33%) Tunneling: No Undermining: No Wound Description Classification: Full Thickness With Exposed Supp Wound Margin: Distinct, outline attached Exudate Amount: Medium Exudate Type: Serous Exudate Color: amber ort Structures Foul Odor After Cleansing: No Slough/Fibrino Yes Wound Bed Granulation Amount: Medium (34-66%) Exposed Structure Granulation Quality: Red Fascia Exposed: No Necrotic Amount: Small (1-33%) Fat Layer (Subcutaneous Tissue) Exposed: Yes Necrotic Quality: Adherent Slough, Bone Tendon Exposed: No Muscle Exposed: No Joint Exposed: No Bone Exposed: Yes Periwound Skin Texture Texture Color No Abnormalities Noted: Yes No Abnormalities Noted: Yes [name], [name] ([id number]) [id number] Page 11 of 14 Moisture Temperature / Pain No Abnormalities Noted: Yes Temperature: No Abnormality Tenderness on Palpation: Yes Treatment Notes Wound #13 (Toe Great) Wound Laterality: Dorsal, Left Cleanser Soap  and Water Discharge Instruction: May shower and wash wound with dial  antibacterial soap and water prior to dressing change. Wound Cleanser Discharge Instruction: Cleanse the wound with wound cleanser prior to applying a clean dressing using gauze sponges, not tissue or cotton balls. Peri-Wound Care Topical Primary Dressing Promogran Prisma Matrix, 4.34 (sq in) (silver collagen) Discharge Instruction: Moisten collagen with saline or hydrogel Secondary Dressing Woven Gauze Sponge, Non-Sterile 4x4 in Discharge Instruction: Apply over primary dressing as directed. Secured With 53M Medipore H Soft Cloth Surgical T ape, 4 x 10 (in/yd) Discharge Instruction: Secure with tape as directed. Compression Wrap Compression Stockings Add-Ons Electronic Signature(s) Signed: 07/09/2023 5:49:41 PM By: Zenaida Deed RN, BSN Entered By: Zenaida Deed on 07/08/2023 12:25:08 -------------------------------------------------------------------------------- Wound Assessment Details Patient Name: Date of Service: Megan Reap. 07/08/2023 2:45 PM Medical Record Number: [id number] Patient Account Number: [id number] Date of Birth/Sex: Treating RN: 09/21/1927 (89 y.o. F) Primary Care Champayne Kocian: [name] Other Clinician: Referring Glendal Cassaday: Treating Cashlynn Yearwood/Extender: Claudie Leach Weeks in Treatment: 17 Wound Status Wound Number: 16 Primary Abrasion Etiology: Wound Location: Left Knee Wound Open Wounding Event: Trauma Status: Date Acquired: 06/18/2023 Comorbid Cataracts, Anemia, Arrhythmia, Congestive Heart Failure, Weeks Of Treatment: 1 History: Hypotension, Peripheral Venous Disease, Osteomyelitis, Clustered Wound: No Neuropathy Photos [place], Clairessa [name] ([id number]) [id number] Page 12 of 14 Wound Measurements Length: (cm) 1.3 Width: (cm) 4.9 Depth: (cm) 0.3 Area: (cm) 5.003 Volume: (cm) 1.501 % Reduction in Area: 64.6% % Reduction in Volume:  64.6% Epithelialization: Small (1-33%) Tunneling: No Undermining: No Wound Description Classification: Full Thickness Without Exposed Support Structures Wound Margin: Distinct, outline attached Exudate Amount: Medium Exudate Type: Serosanguineous Exudate Color: red, brown Foul Odor After Cleansing: No Slough/Fibrino Yes Wound Bed Granulation Amount: Large (67-100%) Exposed Structure Granulation Quality: Red Fascia Exposed: No Necrotic Amount: Small (1-33%) Fat Layer (Subcutaneous Tissue) Exposed: Yes Necrotic Quality: Adherent Slough Tendon Exposed: No Muscle Exposed: No Joint Exposed: No Bone Exposed: No Periwound Skin Texture Texture Color No Abnormalities Noted: Yes No Abnormalities Noted: Yes Moisture Temperature / Pain No Abnormalities Noted: Yes Temperature: No Abnormality Tenderness on Palpation: Yes Treatment Notes Wound #16 (Knee) Wound Laterality: Left Cleanser Normal Saline Discharge Instruction: Cleanse the wound with Normal Saline prior to applying a clean dressing using gauze sponges, not tissue or cotton balls. Peri-Wound Care Topical Primary Dressing Hydrofera Blue Ready Transfer Foam, 4x5 (in/in) Discharge Instruction: Apply to wound bed as instructed Secondary Dressing Zetuvit Plus Silicone Border Dressing 5x5 (in/in) Discharge Instruction: Apply silicone border over primary dressing as directed. Secured With Compression Wrap Compression Stockings Facilities manager) Signed: 07/09/2023 5:49:41 PM By: Zenaida Deed RN, BSN Entered By: Zenaida Deed on 07/08/2023 12:26:32 [name], [name] ([id number]) [id number] Page 13 of 14 -------------------------------------------------------------------------------- Wound Assessment Details Patient Name: Date of Service: [name], [name] 07/08/2023 2:45 PM Medical Record Number: [id number] Patient Account Number: [id number] Date of Birth/Sex: Treating RN: November 03, 1927  (89 y.o. F) Primary Care [name]: [name] Other Clinician: Referring Chasity Outten: Treating Deshawn Witty/Extender: Claudie Leach Weeks in Treatment: 17 Wound Status Wound Number: 9 Primary Pressure Ulcer Etiology: Wound Location: Left Calcaneus Wound Open Wounding Event: Pressure Injury Status: Date Acquired: 10/22/2022 Comorbid Cataracts, Anemia, Arrhythmia, Congestive Heart Failure, Weeks Of Treatment: 17 History: Hypotension, Peripheral Venous Disease, Osteomyelitis, Clustered Wound: No Neuropathy Photos Wound Measurements Length: (cm) Width: (cm) Depth: (cm) Area: (cm) Volume: (cm) 0.4 % Reduction in Area: 71.5% 0.3 % Reduction in Volume: 71.7% 0.3 Epithelialization: Small (1-33%) 0.094 Tunneling: No 0.028 Undermining: No Wound Description Classification: Category/Stage II Wound Margin: Distinct, outline attached Exudate  Amount: Small Exudate Type: Serous Exudate Color: amber Foul Odor After Cleansing: No Slough/Fibrino Yes Wound Bed Granulation Amount: Medium (34-66%) Exposed Structure Granulation Quality: Pink, Pale Fascia Exposed: No Necrotic Amount: Medium (34-66%) Fat Layer (Subcutaneous Tissue) Exposed: Yes Necrotic Quality: Adherent Slough Tendon Exposed: No Muscle Exposed: No Joint Exposed: No Bone Exposed: No Periwound Skin Texture Texture Color No Abnormalities Noted: No No Abnormalities Noted: Yes Callus: Yes Temperature / Pain Temperature: No Abnormality Moisture No Abnormalities Noted: Yes Tenderness on Palpation: Yes Treatment Notes Wound #9 (Calcaneus) Wound Laterality: Left [name], [name] ([id number]) [id number] Page 14 of 14 Cleanser Soap and Water Discharge Instruction: May shower and wash wound with dial antibacterial soap and water prior to dressing change. Wound Cleanser Discharge Instruction: Cleanse the wound with wound cleanser prior to applying a clean dressing using gauze sponges,  not tissue or cotton balls. Peri-Wound Care Topical Primary Dressing Promogran Prisma Matrix, 4.34 (sq in) (silver collagen) Discharge Instruction: Moisten collagen with saline or hydrogel Secondary Dressing ALLEVYN Heel 4 1/2in x 5 1/2in / 10.5cm x 13.5cm Discharge Instruction: Apply over primary dressing as directed. Woven Gauze Sponge, Non-Sterile 4x4 in Discharge Instruction: Apply over primary dressing as directed. Secured With [employer], 4.5x3.1 (in/yd) Discharge Instruction: Secure with Kerlix as directed. 1M Medipore H Soft Cloth Surgical T ape, 4 x 10 (in/yd) Discharge Instruction: Secure with tape as directed. Compression Wrap Compression Stockings Add-Ons Electronic Signature(s) Signed: 07/09/2023 5:49:41 PM By: Zenaida Deed RN, BSN Entered By: Zenaida Deed on 07/08/2023 12:25:37 -------------------------------------------------------------------------------- Vitals Details Patient Name: Date of Service: Megan Reap. 07/08/2023 2:45 PM Medical Record Number: [id number] Patient Account Number: [id number] Date of Birth/Sex: Treating RN: 19-Jun-1928 (89 y.o. [name] Primary Care Errin Whitelaw: [name] Other Clinician: Referring Rea Kalama: Treating Jazmine Longshore/Extender: Claudie Leach Weeks in Treatment: 17 Vital Signs Time Taken: 15:15 Temperature (F): 98 Height (in): 64 Pulse (bpm): 79 Weight (lbs): 157 Respiratory Rate (breaths/min): 18 Body Mass Index (BMI): 26.9 Blood Pressure (mmHg): 177/49 Reference Range: 80 - 120 mg / dl Electronic Signature(s) Signed: 07/09/2023 5:49:41 PM By: Zenaida Deed RN, BSN Entered By: Zenaida Deed on 07/08/2023 12:19:05

## 2023-07-14 NOTE — Progress Notes (Unsigned)
Office Note     CC:  follow up Requesting Provider:  Camie Patience, FNP  HPI: [name] is a 88 y.o. (Oct 08, 1927) female who presents for follow up s/p 03/04/23 left lower extremity angiogram with shockwave lithotripsy of the SFA popliteal, peroneal arteries.  LLE with balloon angioplasty and shockwave ultrasound assisted balloon angioplasty of her left SFA, popliteal and peroneal artery by Dr. Karin Lieu for critical limb ischemia with tissue loss. She was noted to have single vessel peroneal artery outflow on the left. On the right she was noted to have diseased popliteal artery with occlusion of the TPT.  No appreciable tibial runoff but suspect due to contrast timing.   On exam today, had arrived in a wheelchair, accompanied by her daughter.  She continues to struggle with wounds on bilateral lower extremities, and most recently struggling with significant weight gain due to electrolyte issues.  She requires 2 L nasal cannula and supplemental oxygen.  She was last seen by Dr. Lady Gary earlier today.  Per her daughter, the wounds appear to be healing slowly.  There is one that appears stagnant.  Ambulation is minimal.  Denies rest pain.  Past Medical History:  Diagnosis Date   Anticoagulant long-term use    Arrhythmia    Arthritis    Atrial fibrillation (HCC)    Congestive heart failure (CHF) (HCC)    Dysphagia    Esophageal reflux    Essential hypertension    Hiatal hernia    Pulmonary embolus (HCC)    Thyroid disease     Past Surgical History:  Procedure Laterality Date   ABDOMINAL AORTOGRAM W/LOWER EXTREMITY N/A 03/04/2023   Procedure: ABDOMINAL AORTOGRAM W/LOWER EXTREMITY;  Surgeon: [name], MD;  Location: [facility] INVASIVE CV LAB;  Service: Cardiovascular;  Laterality: N/A;   AMPUTATION TOE Right 11/17/2021   Procedure: RIGHT SECOND TOE AMPUTATION;  Surgeon: [name], MD;  Location: [facility] ORS;  Service: Orthopedics;  Laterality: Right;   COLONOSCOPY     ELBOW SURGERY      LAPAROSCOPIC HYSTERECTOMY      Social History   Socioeconomic History   Marital status: Widowed    Spouse name: Not on file   Number of children: Not on file   Years of education: Not on file   Highest education level: Not on file  Occupational History   Not on file  Tobacco Use   Smoking status: Former    Current packs/day: 0.00    Types: Cigarettes    Quit date: 10/02/2001    Years since quitting: 21.7   Smokeless tobacco: Never   Tobacco comments:    former smoker  Vaping Use   Vaping status: Never Used  Substance and Sexual Activity   Alcohol use: Never   Drug use: Never   Sexual activity: Not Currently  Other Topics Concern   Not on file  Social History Narrative   Not on file   Social Determinants of Health   Financial Resource Strain: Not on file  Food Insecurity: No Food Insecurity (05/06/2023)   Hunger Vital Sign    Worried About Running Out of Food in the Last Year: Never true    Ran Out of Food in the Last Year: Never true  Transportation Needs: No Transportation Needs (05/06/2023)   PRAPARE - Administrator, Civil Service (Medical): No    Lack of Transportation (Non-Medical): No  Physical Activity: Not on file  Stress: Not on file  Social Connections: Not on  file  Intimate Partner Violence: Not At Risk (05/06/2023)   Humiliation, Afraid, Rape, and Kick questionnaire    Fear of Current or Ex-Partner: No    Emotionally Abused: No    Physically Abused: No    Sexually Abused: No    Family History  Problem Relation Age of Onset   Stroke Mother    Cancer Brother     Current Outpatient Medications  Medication Sig Dispense Refill   acetaminophen (TYLENOL) 325 MG tablet Take 2 tablets (650 mg total) by mouth every 6 (six) hours as needed for mild pain or headache. (Patient taking differently: Take 650 mg by mouth every 6 (six) hours as needed for mild pain (pain score 1-3), headache or fever.)     ascorbic acid (VITAMIN C) 500 MG tablet Take  500 mg by mouth [name].     aspirin EC 81 MG tablet Take 1 tablet (81 mg total) by mouth [name]. Swallow whole. 150 tablet 2   atorvastatin (LIPITOR) 10 MG tablet Take 1 tablet (10 mg total) by mouth [name]. 30 tablet 11   bumetanide (BUMEX) 0.5 MG tablet Take 0.5 mg by mouth 2 (two) times [name].     carboxymethylcellulose (REFRESH PLUS) 0.5 % SOLN Place 1 drop into both eyes 2 (two) times [name].     ciprofloxacin (CIPRO) 250 MG tablet Take 250 mg by mouth 2 (two) times [name].     diclofenac Sodium (VOLTAREN) 1 % GEL Apply 4 g topically 4 (four) times [name]. To bilateral feet and knees (Patient taking differently: Apply 4 g topically 4 (four) times [name] as needed (pain). To bilateral feet and knees)     estradiol (ESTRACE) 0.1 MG/GM vaginal cream Place 1 Applicatorful vaginally 3 (three) times a week.     gabapentin (NEURONTIN) 300 MG capsule Take 300 mg by mouth 2 (two) times [name].      levothyroxine (SYNTHROID) 100 MCG tablet Take 100 mcg by mouth [name] before breakfast.     Multiple Vitamins-Minerals (PRESERVISION/LUTEIN) CAPS Take 1 capsule by mouth 2 (two) times [name].      ondansetron (ZOFRAN) 4 MG tablet Take 4 mg by mouth [name] as needed.     OVER THE COUNTER MEDICATION Take 1 capsule by mouth at bedtime. Equate Neuriva     pantoprazole (PROTONIX) 40 MG tablet Take 40 mg by mouth [name].     senna-docusate (SENOKOT-S) 8.6-50 MG tablet Take 1 tablet by mouth 2 (two) times [name] between meals as needed for mild constipation. (Patient taking differently: Take 1 tablet by mouth at bedtime.) 60 tablet 0   spironolactone (ALDACTONE) 25 MG tablet Take 12.5 mg by mouth [name].     vitamin B-12 (CYANOCOBALAMIN) 1000 MCG tablet Take 1,000 mcg by mouth [name].     warfarin (COUMADIN) 1 MG tablet Take 1-2 mg by mouth as directed. 1 MG (Mon,Wed,Fri) & 2 MG on (Tues,Thurs,Sat,Sun)     zinc gluconate 50 MG tablet Take 50 mg by mouth [name].     No current facility-administered medications for this visit.     Allergies  Allergen Reactions   Amoxicillin Other (See Comments)    Unknown reaction  Tolerates Keflex, Cefepime   Azithromycin Other (See Comments)    Unknown reaction    Codeine Other (See Comments)    Unknown reaction    Erythromycin Other (See Comments)    Unknown reaction    Green Dyes Other (See Comments)    Allergic to ALL dyes   Iodine Other (See Comments)  Unknown reaction    Misc. Sulfonamide Containing Compounds    Oxycodone Other (See Comments)    Unknown reaction      Oxycodone-Acetaminophen Other (See Comments)    Unknown reaction    Penicillins Other (See Comments)    Unknown reaction  Tolerates Keflex, Cefepime   Sulfa Antibiotics     Per Pt's daughter - unknown reaction    Sulfasalazine     Unsure of allergy    Tramadol Hives     REVIEW OF SYSTEMS:  [X]  denotes positive finding, [ ]  denotes negative finding Cardiac  Comments:  Chest pain or chest pressure:    Shortness of breath upon exertion:    Short of breath when lying flat:    Irregular heart rhythm:        Vascular    Pain in calf, thigh, or hip brought on by ambulation:    Pain in feet at night that wakes you up from your sleep:     Blood clot in your veins:    Leg swelling:         Pulmonary    Oxygen at home:    Productive cough:     Wheezing:         Neurologic    Sudden weakness in arms or legs:     Sudden numbness in arms or legs:     Sudden onset of difficulty speaking or slurred speech:    Temporary loss of vision in one eye:     Problems with dizziness:         Gastrointestinal    Blood in stool:     Vomited blood:         Genitourinary    Burning when urinating:     Blood in urine:        Psychiatric    Major depression:         Hematologic    Bleeding problems:    Problems with blood clotting too easily:        Skin    Rashes or ulcers:        Constitutional    Fever or chills:      PHYSICAL EXAMINATION:  There were no vitals filed for this  visit.   General:  WDWN in NAD; vital signs documented above Gait: Not observed, in wheel chair HENT: WNL, normocephalic Pulmonary: normal non-labored breathing without  wheezing Cardiac: regular HR Abdomen: soft Vascular Exam/Pulses: 2+ femoral pulses Extremities: without ischemic changes, without Gangrene , without cellulitis; with open wound of bilateral toes. Toes are dressed. I did not remove these as they had collagen and medicated dressings in place Musculoskeletal: no muscle wasting or atrophy  Neurologic: A&O X 3 Psychiatric:  The pt has Normal affect.   Non-Invasive Vascular Imaging:   +-------+----------------+-----------+------------+------------+  ABI/TBIToday's ABI     Today's TBIPrevious ABIPrevious TBI  +-------+----------------+-----------+------------+------------+  Right 1.13 (calcified)bandages   0.48        0.39          +-------+----------------+-----------+------------+------------+  Left  0.90 (calcified)bandages   0.46        0.21          +-------+----------------+-----------+------------+------------+    ASSESSMENT/PLAN:: 89 y.o. female here for follow up for peripheral artery disease with bilateral foot wounds. On 03/04/23 she underwent Aortogram, Arteriogram of LLE with balloon angioplasty and shockwave ultrasound assisted balloon angioplasty of her left SFA, popliteal and peroneal artery by Dr. Karin Lieu.    Since that time, [name] has  continued to struggle with lower extremity wounds which wax and wane.  She was recently hospitalized with electrolyte abnormalities.  Now out of the hospital, she has struggled with fluid overload.  On exam, she arrived in a wheelchair, with 2 L nasal cannula, severely edematous.  I had a long conversation with both her and her daughter regarding her overall clinical status, and prognosis.  In short, I do not think [name] will live much longer.    Regarding the lower extremities, the wounds appear to be healing  slowly, therefore I think the best course of action is continued medical management.  Should they worsen I would offer repeat angiogram, however prefaced it with the fact I am unsure if it will provide enough perfusion for wound healing, especially as she has single-vessel outflow to the foot with microvascular disease.  After reviewing her previous angiogram, I think further intervention may require stenting through the popliteal artery which has a low patency rate.  She is aware that if any of the stents occluded, it would place [name] and a limb loss situation which would be an amputation above the knee.    I think [name] would be best served with hospice and continued conservative treatment of her lower extremity wounds. Should the wounds worsen, and her daughter decided to move forward with all interventions, I would offer repeat angiogram.  [name] asked for a 75-month follow-up, and stated she would call should they choose to pursue intervention or hospice care. Of note, [name] has been struggling with memory, and was unable to participate meaningfully in the above discussion.    [name] MD [facility] Total time of patient care including pre-visit research, consultation, and documentation greater than 45 minutes

## 2023-07-15 ENCOUNTER — Encounter (HOSPITAL_BASED_OUTPATIENT_CLINIC_OR_DEPARTMENT_OTHER): Payer: Medicare HMO | Admitting: General Surgery

## 2023-07-16 ENCOUNTER — Ambulatory Visit: Payer: Medicare HMO | Admitting: Vascular Surgery

## 2023-07-16 ENCOUNTER — Encounter: Payer: Self-pay | Admitting: Vascular Surgery

## 2023-07-16 VITALS — BP 128/54 | HR 78 | Temp 98.1°F | Resp 20 | Ht 63.0 in | Wt 156.0 lb

## 2023-07-16 DIAGNOSIS — L97909 Non-pressure chronic ulcer of unspecified part of unspecified lower leg with unspecified severity: Secondary | ICD-10-CM

## 2023-07-16 DIAGNOSIS — I872 Venous insufficiency (chronic) (peripheral): Secondary | ICD-10-CM | POA: Diagnosis not present

## 2023-07-16 DIAGNOSIS — I70299 Other atherosclerosis of native arteries of extremities, unspecified extremity: Secondary | ICD-10-CM | POA: Diagnosis not present

## 2023-07-22 ENCOUNTER — Encounter (HOSPITAL_BASED_OUTPATIENT_CLINIC_OR_DEPARTMENT_OTHER): Payer: Medicare HMO | Attending: General Surgery | Admitting: General Surgery

## 2023-07-22 DIAGNOSIS — I130 Hypertensive heart and chronic kidney disease with heart failure and stage 1 through stage 4 chronic kidney disease, or unspecified chronic kidney disease: Secondary | ICD-10-CM | POA: Insufficient documentation

## 2023-07-22 DIAGNOSIS — L97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 without evidence of necrosis: Secondary | ICD-10-CM | POA: Diagnosis not present

## 2023-07-22 DIAGNOSIS — I4891 Unspecified atrial fibrillation: Secondary | ICD-10-CM | POA: Diagnosis not present

## 2023-07-22 DIAGNOSIS — I5032 Chronic diastolic (congestive) heart failure: Secondary | ICD-10-CM | POA: Diagnosis not present

## 2023-07-22 DIAGNOSIS — L89623 Pressure ulcer of left heel, stage 3: Secondary | ICD-10-CM | POA: Insufficient documentation

## 2023-07-22 DIAGNOSIS — J449 Chronic obstructive pulmonary disease, unspecified: Secondary | ICD-10-CM | POA: Insufficient documentation

## 2023-07-22 DIAGNOSIS — E039 Hypothyroidism, unspecified: Secondary | ICD-10-CM | POA: Insufficient documentation

## 2023-07-22 DIAGNOSIS — I739 Peripheral vascular disease, unspecified: Secondary | ICD-10-CM | POA: Diagnosis not present

## 2023-07-22 DIAGNOSIS — N1832 Chronic kidney disease, stage 3b: Secondary | ICD-10-CM | POA: Insufficient documentation

## 2023-07-22 DIAGNOSIS — I872 Venous insufficiency (chronic) (peripheral): Secondary | ICD-10-CM | POA: Insufficient documentation

## 2023-07-23 NOTE — Progress Notes (Addendum)
UNIQUA, RUSCOE ([id number]) [id number] Page 1 of 17 Visit Report for 07/22/2023 Chief Complaint Document Details Patient Name: Date of Service: [name], [name] 07/22/2023 2:45 PM Medical Record Number: [id number] Patient Account Number: [id number] Date of Birth/Sex: Treating RN: 1928-04-19 (89 y.o. F) Primary Care Provider: [name] Other Clinician: Referring Provider: Treating Provider/Extender: Tiajuana Amass in Treatment: 19 Information Obtained from: Patient Chief Complaint 01/28/2022; right second toe amputation site dehiscence and bilateral lower extremity wounds. 03/10/2023: pressure ulcers of right heel, ulcers on toes Electronic Signature(s) Signed: 07/22/2023 3:43:59 PM By: Duanne Guess MD FACS Entered By: Duanne Guess on 07/22/2023 15:43:59 -------------------------------------------------------------------------------- Debridement Details Patient Name: Date of Service: Megan Reap. 07/22/2023 2:45 PM Medical Record Number: [id number] Patient Account Number: [id number] Date of Birth/Sex: Treating RN: [birth year] (89 y.o. [name], [name] Primary Care Provider: [name] Other Clinician: Referring Provider: Treating Provider/Extender: Claudie Leach Weeks in Treatment: 19 Debridement Performed for Assessment: Wound #16 Left Knee Performed By: Physician Duanne Guess, MD Debridement Type: Debridement Level of Consciousness (Pre-procedure): Awake and Alert Pre-procedure Verification/Time Out Yes - 15:05 Taken: Start Time: 15:08 Pain Control: Lidocaine 4% T opical Solution Percent of Wound Bed Debrided: 100% T Area Debrided (cm): otal 1.06 Tissue and other material debrided: Viable, Non-Viable, Slough, Subcutaneous, Slough Level: Skin/Subcutaneous Tissue Debridement Description: Excisional Instrument: Curette Bleeding: Minimum Hemostasis Achieved: Pressure Procedural Pain:  0 Post Procedural Pain: 0 Response to Treatment: Procedure was tolerated well Level of Consciousness (Post- Awake and Alert procedure): Post Debridement Measurements of Total Wound Length: (cm) 0.9 Width: (cm) 1.5 Depth: (cm) 0.1 Volume: (cm) 0.106 Character of Wound/Ulcer Post Debridement: Improved [name], [name] ([id number]) [id number] Page 2 of 17 Post Procedure Diagnosis Same as Pre-procedure Electronic Signature(s) Signed: 07/22/2023 4:45:05 PM By: Duanne Guess MD FACS Signed: 07/22/2023 5:10:59 PM By: Zenaida Deed RN, BSN Entered By: Zenaida Deed on 07/22/2023 15:11:31 -------------------------------------------------------------------------------- Debridement Details Patient Name: Date of Service: Megan Reap. 07/22/2023 2:45 PM Medical Record Number: [id number] Patient Account Number: [id number] Date of Birth/Sex: Treating RN: [birth year] (89 y.o. [name] Primary Care Provider: [name] Other Clinician: Referring Provider: Treating Provider/Extender: Claudie Leach Weeks in Treatment: 19 Debridement Performed for Assessment: Wound #10 Left T Second oe Performed By: Physician Duanne Guess, MD The following information was scribed by: Zenaida Deed The information was scribed for: Duanne Guess Debridement Type: Debridement Level of Consciousness (Pre-procedure): Awake and Alert Pre-procedure Verification/Time Out Yes - 15:05 Taken: Start Time: 15:08 Pain Control: Lidocaine 4% T opical Solution Percent of Wound Bed Debrided: 100% T Area Debrided (cm): otal 0.25 Tissue and other material debrided: Non-Viable, Slough, Slough Level: Non-Viable Tissue Debridement Description: Selective/Open Wound Instrument: Curette Bleeding: Minimum Hemostasis Achieved: Pressure Procedural Pain: 0 Post Procedural Pain: 0 Response to Treatment: Procedure was tolerated well Level of Consciousness  (Post- Awake and Alert procedure): Post Debridement Measurements of Total Wound Length: (cm) 0.4 Stage: Category/Stage IV Width: (cm) 0.8 Depth: (cm) 0.2 Volume: (cm) 0.05 Character of Wound/Ulcer Post Debridement: Improved Post Procedure Diagnosis Same as Pre-procedure Electronic Signature(s) Signed: 07/22/2023 4:45:05 PM By: Duanne Guess MD FACS Signed: 07/22/2023 5:10:59 PM By: Zenaida Deed RN, BSN Entered By: Zenaida Deed on 07/22/2023 15:12:32 Debridement Details -------------------------------------------------------------------------------- [name] ([id number]) [id number] Page 3 of 17 Patient Name: Date of Service: [name], [name] 07/22/2023 2:45 PM Medical Record Number: [id number] Patient Account Number: [id number] Date of Birth/Sex: Treating RN: Jun 10, 1928 (89 y.o. F) Zenaida Deed Primary  Care Provider: [name] Other Clinician: Referring Provider: Treating Provider/Extender: Claudie Leach Weeks in Treatment: 19 Debridement Performed for Assessment: Wound #13 Left,Dorsal T Great oe Performed By: Physician Duanne Guess, MD Debridement Type: Debridement Severity of Tissue Pre Debridement: Necrosis of bone Level of Consciousness (Pre-procedure): Awake and Alert Pre-procedure Verification/Time Out Yes - 15:05 Taken: Start Time: 15:08 Pain Control: Lidocaine 4% T opical Solution Percent of Wound Bed Debrided: 100% T Area Debrided (cm): otal 1.26 Tissue and other material debrided: Non-Viable, Slough, Slough Level: Non-Viable Tissue Debridement Description: Selective/Open Wound Instrument: Curette Bleeding: Minimum Hemostasis Achieved: Pressure Procedural Pain: 0 Post Procedural Pain: 0 Response to Treatment: Procedure was tolerated well Level of Consciousness (Post- Awake and Alert procedure): Post Debridement Measurements of Total Wound Length: (cm) 1 Width: (cm) 1.6 Depth: (cm)  0.1 Volume: (cm) 0.126 Character of Wound/Ulcer Post Debridement: Improved Severity of Tissue Post Debridement: Necrosis of bone Post Procedure Diagnosis Same as Pre-procedure Electronic Signature(s) Signed: 07/22/2023 4:45:05 PM By: Duanne Guess MD FACS Signed: 07/22/2023 5:10:59 PM By: Zenaida Deed RN, BSN Entered By: Zenaida Deed on 07/22/2023 15:13:14 -------------------------------------------------------------------------------- Debridement Details Patient Name: Date of Service: Megan Reap. 07/22/2023 2:45 PM Medical Record Number: [id number] Patient Account Number: [id number] Date of Birth/Sex: Treating RN: 1927-11-20 (89 y.o. [name] Primary Care Provider: [name] Other Clinician: Referring Provider: Treating Provider/Extender: Claudie Leach Weeks in Treatment: 19 Debridement Performed for Assessment: Wound #11 Right T Second oe Performed By: Physician Duanne Guess, MD The following information was scribed by: Zenaida Deed The information was scribed for: Duanne Guess Debridement Type: Debridement Level of Consciousness (Pre-procedure): Awake and Alert Pre-procedure Verification/Time Out Yes - 15:05 Taken: Start Time: 15:08 Pain Control: Lidocaine 4% T opical Solution Percent of Wound Bed Debrided: 100% T Area Debrided (cm): otal 0.28 Tissue and other material debrided: Non-Viable, Slough, Slough Level: Non-Viable Tissue [name], [name] ([id number]) [id number] Page 4 of 17 Debridement Description: Selective/Open Wound Instrument: Curette Bleeding: Minimum Hemostasis Achieved: Pressure Procedural Pain: 0 Post Procedural Pain: 0 Response to Treatment: Procedure was tolerated well Level of Consciousness (Post- Awake and Alert procedure): Post Debridement Measurements of Total Wound Length: (cm) 0.4 Stage: Category/Stage IV Width: (cm) 0.9 Depth: (cm) 0.1 Volume: (cm)  0.028 Character of Wound/Ulcer Post Debridement: Improved Post Procedure Diagnosis Same as Pre-procedure Electronic Signature(s) Signed: 07/22/2023 4:45:05 PM By: Duanne Guess MD FACS Signed: 07/22/2023 5:10:59 PM By: Zenaida Deed RN, BSN Entered By: Zenaida Deed on 07/22/2023 15:14:28 -------------------------------------------------------------------------------- Debridement Details Patient Name: Date of Service: Megan Reap. 07/22/2023 2:45 PM Medical Record Number: [id number] Patient Account Number: [id number] Date of Birth/Sex: Treating RN: [birth year] (89 y.o. [name] Primary Care Provider: [name] Other Clinician: Referring Provider: Treating Provider/Extender: Claudie Leach Weeks in Treatment: 19 Debridement Performed for Assessment: Wound #9 Left Calcaneus Performed By: Physician Duanne Guess, MD The following information was scribed by: Zenaida Deed The information was scribed for: Duanne Guess Debridement Type: Debridement Level of Consciousness (Pre-procedure): Awake and Alert Pre-procedure Verification/Time Out Yes - 15:05 Taken: Start Time: 15:08 Pain Control: Lidocaine 4% T opical Solution Percent of Wound Bed Debrided: 100% T Area Debrided (cm): otal 0.07 Tissue and other material debrided: Viable, Non-Viable, Slough, Subcutaneous, Slough Level: Skin/Subcutaneous Tissue Debridement Description: Excisional Instrument: Curette Bleeding: Minimum Hemostasis Achieved: Pressure Procedural Pain: 0 Post Procedural Pain: 0 Response to Treatment: Procedure was tolerated well Level of Consciousness (Post- Awake and Alert procedure): Post Debridement Measurements of Total Wound  Length: (cm) 0.3 Stage: Category/Stage III Width: (cm) 0.3 Depth: (cm) 0.1 Volume: (cm) 0.007 Character of Wound/Ulcer Post Debridement: Improved Post Procedure Diagnosis Same as Pre-procedure [name], [name] ([id number])  [id number] Page 5 of 17 Electronic Signature(s) Signed: 07/28/2023 4:12:59 PM By: Duanne Guess MD FACS Signed: 07/28/2023 4:21:34 PM By: Zenaida Deed RN, BSN Previous Signature: 07/22/2023 4:45:05 PM Version By: Duanne Guess MD FACS Previous Signature: 07/22/2023 5:10:59 PM Version By: Zenaida Deed RN, BSN Entered By: Zenaida Deed on 07/28/2023 16:08:56 -------------------------------------------------------------------------------- HPI Details Patient Name: Date of Service: Megan Reap. 07/22/2023 2:45 PM Medical Record Number: [id number] Patient Account Number: [id number] Date of Birth/Sex: Treating RN: 1927-09-09 (89 y.o. F) Primary Care Provider: [name] Other Clinician: Referring Provider: Treating Provider/Extender: Claudie Leach Weeks in Treatment: 19 History of Present Illness HPI Description: Admission 01/28/2022 Ms. 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 She states she has tried Medihoney and silver alginate in the past to this area with little benefit. She also has 2 small areas limited to skin breakdown to her lower extremities bilaterally. She has chronic venous insufficiency but not has not been wearing her compression stockings. She states she bumped her legs against an object and not so the wound started. She has been using Medihoney to the sites. She denies signs of infection. 6/2; patient presents for follow-up. She had an x-ray of her right foot done at last clinic visit and this was negative for evidence of osteomyelitis. She also had a wound culture done that showed extra high levels of Staph aureus. I recommended  Keystone antibiotics for this and this was ordered. She had ABIs with TBI's done as well that showed monophasic waveforms to the right foot with TBI of 0 and an ABI of 0.52. Urgent referral was made to vein and vascular and she saw Dr. Durwin Nora on 6/1, yesterday and he recommended an arteriogram. This is scheduled for 6/16. Patient also reports a new wound to the right great toe. This is a blister that has ruptured. She also reports increased erythema to the toe. 6/6; the patient was worked in urgently today at the insistence of her daughter out of concern for a new wound on the lateral part of the plantar right great toe. She has her original postsurgical wound after the amputation of the right second toe, she has a wound on the medial part of the right great toe. The patient is apparently going for an angiogram by Dr. Durwin Nora in 2 weeks time. 6/13; patient presents for follow-up. She has been using bacitracin to the abrasion on the right great toe. She has been using collagen and Keystone antibiotic to the amputation site. She has no issues or complaints today. She denies signs of infection. 6/22; patient presents for follow-up. She states that her abdominal aortogram was canceled due to her renal function. She has been using Keystone antibiotics to the amputation site and Medihoney to the right great toe wound. At the pace of the right great toe she has a slitlike open area that she thinks was caused by the tape from the dressing. 6/29; patient presents for follow-up. She has been using Keystone antibiotics to the amputation site along with collagen. She has been  using Medihoney to the right great toe wound. She has no other wounds. She denies signs of infection. 7/13; patient presents for follow-up. She has been using Keystone antibiotics and collagen to the wound sites. She currently denies signs of infection. She states she is scheduled to see me nephrology next month. 7/20; patient presents for  follow-up. She continue Keystone antibiotics and collagen to the wound sites. She has no issues or complaints today. 8/1; patient presents for follow up. She continues to use keystone antibiotics and collagen to the wound sites. She has no issues or complaints today. 8/15; patient presents for follow-up. She has been using Keystone antibiotics and collagen to the wound sites. She followed up with her nephrologist who made medication changes. She is supposed to get a repeat BMP in 2 weeks. Her decrease in renal function was a limiting factor in obtaining an arteriogram for potential intervention for revascularization. She currently denies signs of infection. 9/2; patient presents for follow-up. She has been using Keystone antibiotic and collagen to the wound beds. She has no issues or complaints today. Reports there has been improvement in kidney function however not cleared to have her arteriogram just yet. 10/10; patient presents for follow-up. She has been using Keystone antibiotic and collagen to the wound beds. She reports 2 new wounds 1 to the anterior right lower extremity and another to the plantar aspect of the right foot. She states that the right plantar foot wound was caused by the home health nurse changing the dressing and causing a skin tear. She is not sure how the right anterior leg wound started. It appears to be from trauma. She denies signs of infection. 10/19; patient presents for follow-up. She has been using Keystone antibiotic and collagen to the right great toe wound. She is been using silver alginate to the right anterior and right plantar foot wound. She has been using Tubigrip to the right lower extremity. The plantar foot wound has healed. She has no issues or complaints today. 11/3; since the patient was last here she was seen in urgent care apparently for an area on the dorsal aspect of the right fifth toe perhaps over the PIP. I saw a picture of this on the daughter's  cell phone. There was slough on this. Urgent care gave him doxycycline. She is also changed the dressing to all wounds back the [facility] and collagen which includes her right leg and left first toe 11/16; patient presents for follow-up. She has a new wound to the left knee. She states she fell. She has been using antibiotic ointment and collagen to this area. She has been using collagen and Keystone antibiotic to the right great toe wound. The anterior right leg wound is healed. She denies signs of infection. 11/30; patient presents for follow-up. The right great toe wound has healed. She has 1 remaining wound to the left knee. She has been [address] [place], [name] ([id number]) [id number] Page 6 of 17 and Medihoney here. 12/19; patient presents for follow-up. Her left knee wound has healed. She has no issues or complaints today. 09/30/2022 Patient's daughter called for an appointment due to increased swelling to her lower extremities bilaterally. Today patient presents with increased swelling to her lower extremities although there is no increased redness or warmth. She was evaluated by her nephrologist who increased her diuretics. Per patient and daughter her swelling has gone down to the leg Over the past several days. She does not have any open wounds. She  was advised to elevate her legs and not consume excess salt By her PCP and nephrologist. Patient has compression stockings that she has been using sporadically. READMISSION 03/10/2023 Since her last visit to the clinic, the patient has been hospitalized at least twice, in February with COVID pneumonia and CHF exacerbation. She was discharged to a skilled nursing facility and then readmitted in April with fevers. She was noted to have pressure ulcers on her heels and sacrum. Per family declined skilled nursing placement upon discharge and she has apparently been residing at home. She has followed with the  vascular surgery clinic regarding peripheral artery disease and due to ulcerations on her feet, she ultimately underwent arteriography with balloon angioplasties of the superficial femoral artery, popliteal artery and peroneal artery on the left, along with shockwave ultrasound assisted balloon angioplasty of the popliteal artery and distal SFA. Given her multiple significant medical comorbidities, she is not a candidate for open surgery. She is deemed to be maximally vascularized this procedure, which was performed on 04 March 2023. T oday, she has a stage III pressure ulcer on her left heel and wounds on the PIP joints of her right third and left second toes. No sacral wound is present and the ulcer on her left heel has closed. 03/18/2023: She has a new ulcer on her left great toe. It was just noticed yesterday and the etiology is unknown. The fat layer is exposed and there is some slough accumulation. The other wounds are all essentially unchanged in size. There is a little bit more granulation tissue on the other toe wounds, but bone is still frankly exposed on both sides. The heel ulcer has thick slough accumulation. 03/26/2023: She has a new wound on the tip of her left third toe. It is black. Neither the patient nor her daughter are aware of how it began. The other wounds are stable. 03/31/2023: The culture that I took from her left third toe returned with MRSA. We contacted the patient's daughter to have her stop levofloxacin and start doxycycline, but she has not yet picked up the new antibiotic. All of her wounds have accumulated slough. Bone remains exposed. She is scheduled to follow- up with vascular surgery tomorrow to evaluate the patency of her revascularization. 04/09/2023: She saw her vascular surgeon and her ABIs are improved; her TBI's could not be checked due to her bandages. She did finally start taking the prescribed doxycycline. All of her wounds look about the same  today. 04/23/2023: No significant changes to any of her wounds aside from being slightly smaller other than the left dorsal great toe wound, which is stable. She has accumulated slough and eschar on all of the surfaces. 04/30/2023: All of the wounds are stable with the exception of the left dorsal great toe wound which now has tendon exposed in addition to bone. There is slough and eschar accumulation on all sites. 05/15/2023: The patient was in the hospital and missed her appointment last week. The wound at the tip of her left third toe is healed. Everything else looks the same. 05/21/2023: No real change to any of the wounds. 05/28/2023: Once again, there has been no real change to any of her wounds except that the wound at the tip of her left great toe is closed. 06/11/2019: No change to any of her wounds. 06/25/2023: The wound on her right great toe nailbed has healed. The other wounds on her toes are unchanged and continue to have bone/joints exposed. The ulcer on  her heel is smaller and shallower with very little necrotic tissue present. She fell last week and split her knee open. Apparently she presented to the ED greater than 12 hours after injury and they attempted to loosely suture the site. The sutures have pulled through her skin and they are doing absolutely nothing at this point. The wound is full of nonviable tissue. The muscle fascia is visible. Her PCP has her on doxycycline. 07/08/2023: The wounds on her feet are basically unchanged although the heel ulcer may be slightly smaller. The wound on her left knee looks much better this week. It is cleaner but still has a layer of slough on the surface. I can no longer see the muscle fascia. 07/22/2023: The heel ulcer is smaller and shallower. The left knee wound has also closed up considerably with just a small superficial open area that remains. The wounds on her toes are unchanged with persistent bone exposure. Electronic  Signature(s) Signed: 07/22/2023 3:44:42 PM By: Duanne Guess MD FACS Entered By: Duanne Guess on 07/22/2023 15:44:42 -------------------------------------------------------------------------------- Physical Exam Details Patient Name: Date of Service: Megan Reap. 07/22/2023 2:45 PM Medical Record Number: [id number] Patient Account Number: [id number] Date of Birth/Sex: Treating RN: [birth year] (89 y.o. F) Primary Care Provider: [name] Other Clinician: Referring Provider: Treating Provider/Extender: Claudie Leach Weeks in Treatment: 19 Constitutional Hypertensive, asymptomatic. . . . no acute distress. KYNNEDY, CODY ([id number]) [id number] Page 7 of 17 Respiratory Normal work of breathing on supplemental oxygen. Notes 07/22/2023: The heel ulcer is smaller and shallower. The left knee wound has also closed up considerably, with just a small superficial open area that remains. The wounds on her toes are unchanged with persistent bone exposure. Electronic Signature(s) Signed: 07/22/2023 3:46:19 PM By: Duanne Guess MD FACS Entered By: Duanne Guess on 07/22/2023 15:46:19 -------------------------------------------------------------------------------- Physician Orders Details Patient Name: Date of Service: Megan Reap. 07/22/2023 2:45 PM Medical Record Number: [id number] Patient Account Number: [id number] Date of Birth/Sex: Treating RN: 08-23-1928 (89 y.o. [name], [name] Primary Care Provider: [name] Other Clinician: Referring Provider: Treating Provider/Extender: Tiajuana Amass in Treatment: 19 The following information was scribed by: Zenaida Deed The information was scribed for: Duanne Guess Verbal / Phone Orders: No Diagnosis Coding ICD-10 Coding Code Description [phone] Pressure ulcer of left heel, stage 3 L97.516 Non-pressure chronic ulcer of other part of right foot  with bone involvement without evidence of necrosis 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 without evidence of necrosis L97.825 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 involvement without evidence of necrosis I73.9 Peripheral vascular disease, unspecified I50.32 Chronic diastolic (congestive) heart failure N18.32 Chronic kidney disease, stage 3b I87.2 Venous insufficiency (chronic) (peripheral) Follow-up Appointments ppointment in 2 weeks. - Dr. Lady Gary - room 1 Return A Wed 11/20 @ 2:45 pm Anesthetic (In clinic) Topical Lidocaine 4% applied to wound bed Bathing/ Shower/ Hygiene May shower and wash wound with soap and water. Additional Orders / Instructions Follow Nutritious Diet - add in protein shakes every day to diet - recommend premier protein 500 mg x3 a day vitamin C, zinc 30-50 mg per day Home Health No change in wound care orders this week; continue Home Health for wound care. May utilize formulary equivalent dressing for wound treatment orders unless otherwise specified. Other Home Health Orders/Instructions: - Medi Home Wound Treatment Wound #10 - T Second oe Wound Laterality: Left Cleanser: Soap and Water  1 x Per Day/30 Days Discharge Instructions: May shower and wash wound with dial antibacterial soap and water prior to dressing change. Cleanser: Wound Cleanser 1 x Per Day/30 Days Discharge Instructions: Cleanse the wound with wound cleanser prior to applying a clean dressing using gauze sponges, not tissue or cotton balls. Peri-Wound Care: Sween Lotion (Moisturizing lotion) 1 x Per Day/30 Days Discharge Instructions: Apply moisturizing lotion as directed [name], [name] ([id number]) [id number] Page 8 of 17 Prim Dressing: Promogran Prisma Matrix, 4.34 (sq in) (silver collagen) (Dispense As Written) 1 x Per Day/30 Days ary Discharge Instructions: Moisten collagen with  saline or hydrogel Secondary Dressing: Woven Gauze Sponge, Non-Sterile 4x4 in (Generic) 1 x Per Day/30 Days Discharge Instructions: Apply over primary dressing as directed. Secured With: [employer], 4.5x3.1 (in/yd) (Generic) 1 x Per Day/30 Days Discharge Instructions: Secure with Kerlix as directed. Secured With: 47M Medipore H Soft Cloth Surgical T ape, 4 x 10 (in/yd) (Generic) 1 x Per Day/30 Days Discharge Instructions: Secure with tape as directed. Wound #11 - T Second oe Wound Laterality: Right Cleanser: Soap and Water 1 x Per Day/30 Days Discharge Instructions: May shower and wash wound with dial antibacterial soap and water prior to dressing change. Cleanser: Wound Cleanser 1 x Per Day/30 Days Discharge Instructions: Cleanse the wound with wound cleanser prior to applying a clean dressing using gauze sponges, not tissue or cotton balls. Prim Dressing: Promogran Prisma Matrix, 4.34 (sq in) (silver collagen) (Dispense As Written) 1 x Per Day/30 Days ary Discharge Instructions: Moisten collagen with saline or hydrogel Secondary Dressing: Woven Gauze Sponge, Non-Sterile 4x4 in (Generic) 1 x Per Day/30 Days Discharge Instructions: Apply over primary dressing as directed. Secured With: Insurance underwriter, Sterile 2x75 (in/in) (Generic) 1 x Per Day/30 Days Discharge Instructions: Secure with stretch gauze as directed. Secured With: 47M Medipore H Soft Cloth Surgical T ape, 4 x 10 (in/yd) (Generic) 1 x Per Day/30 Days Discharge Instructions: Secure with tape as directed. Wound #13 - T Great oe Wound Laterality: Dorsal, Left Cleanser: Soap and Water 1 x Per Day/30 Days Discharge Instructions: May shower and wash wound with dial antibacterial soap and water prior to dressing change. Cleanser: Wound Cleanser 1 x Per Day/30 Days Discharge Instructions: Cleanse the wound with wound cleanser prior to applying a clean dressing using gauze sponges, not tissue or cotton  balls. Prim Dressing: Promogran Prisma Matrix, 4.34 (sq in) (silver collagen) (Dispense As Written) 1 x Per Day/30 Days ary Discharge Instructions: Moisten collagen with saline or hydrogel Secondary Dressing: Woven Gauze Sponge, Non-Sterile 4x4 in (Generic) 1 x Per Day/30 Days Discharge Instructions: Apply over primary dressing as directed. Secured With: 47M Medipore H Soft Cloth Surgical T ape, 4 x 10 (in/yd) (Generic) 1 x Per Day/30 Days Discharge Instructions: Secure with tape as directed. Wound #16 - Knee Wound Laterality: Left Cleanser: Normal Saline (Generic) 3 x Per Week/30 Days Discharge Instructions: Cleanse the wound with Normal Saline prior to applying a clean dressing using gauze sponges, not tissue or cotton balls. Prim Dressing: Hydrofera Blue Ready Transfer Foam, 4x5 (in/in) 3 x Per Week/30 Days ary Discharge Instructions: Apply to wound bed as instructed Secondary Dressing: Zetuvit Plus Silicone Border Dressing 5x5 (in/in) 3 x Per Week/30 Days Discharge Instructions: Apply silicone border over primary dressing as directed. Wound #9 - Calcaneus Wound Laterality: Left Cleanser: Soap and Water 1 x Per Day/30 Days Discharge Instructions: May shower and wash wound with dial antibacterial soap and water prior to dressing  change. Cleanser: Wound Cleanser 1 x Per Day/30 Days Discharge Instructions: Cleanse the wound with wound cleanser prior to applying a clean dressing using gauze sponges, not tissue or cotton balls. Prim Dressing: Promogran Prisma Matrix, 4.34 (sq in) (silver collagen) (Dispense As Written) 1 x Per Day/30 Days ary Discharge Instructions: Moisten collagen with saline or hydrogel Secondary Dressing: ALLEVYN Heel 4 1/2in x 5 1/2in / 10.5cm x 13.5cm (Generic) 1 x Per Day/30 Days Discharge Instructions: Apply over primary dressing as directed. Secondary Dressing: Woven Gauze Sponge, Non-Sterile 4x4 in (Generic) 1 x Per Day/30 Days Discharge Instructions: Apply over  primary dressing as directed. Secured With: [employer], 4.5x3.1 (in/yd) (Generic) 1 x Per Day/30 Days Discharge Instructions: Secure with Kerlix as directed. [name], [name] ([id number]) [id number] Page 9 of 17 Secured With: 49M Medipore H Soft Cloth Surgical T ape, 4 x 10 (in/yd) (Generic) 1 x Per Day/30 Days Discharge Instructions: Secure with tape as directed. Electronic Signature(s) Signed: 07/22/2023 4:45:05 PM By: Duanne Guess MD FACS Entered By: Duanne Guess on 07/22/2023 15:46:42 -------------------------------------------------------------------------------- Problem List Details Patient Name: Date of Service: Megan Reap. 07/22/2023 2:45 PM Medical Record Number: [id number] Patient Account Number: [id number] Date of Birth/Sex: Treating RN: 20-Jul-1928 (89 y.o. 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 03/10/2023 No Yes without evidence of necrosis 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 03/10/2023 No Yes without evidence of necrosis L97.825 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 06/25/2023 No Yes involvement without evidence of necrosis I73.9 Peripheral vascular disease, unspecified 03/10/2023 No Yes I50.32 Chronic diastolic (congestive) heart failure 03/10/2023 No Yes N18.32 Chronic kidney disease, stage 3b 03/10/2023 No Yes I87.2 Venous insufficiency (chronic) (peripheral) 03/10/2023 No Yes Inactive Problems ICD-10 Code Description Active Date Inactive Date L97.522 Non-pressure chronic ulcer of other part of left  foot with fat layer exposed 03/18/2023 03/18/2023 [name], [name] ([id number]) [id number] Page 10 of 17 Resolved Problems Electronic Signature(s) Signed: 07/22/2023 3:43:26 PM By: Duanne Guess MD FACS Entered By: Duanne Guess on 07/22/2023 15:43:26 -------------------------------------------------------------------------------- Progress Note Details Patient Name: Date of Service: Megan Reap. 07/22/2023 2:45 PM Medical Record Number: [id number] Patient Account Number: [id number] Date of Birth/Sex: Treating RN: 1927/12/24 (89 y.o. F) Primary Care Provider: [name] Other Clinician: Referring Provider: Treating Provider/Extender: Tiajuana Amass in Treatment: 19 Subjective Chief Complaint Information obtained from Patient 01/28/2022; right second toe amputation site dehiscence and bilateral lower extremity wounds. 03/10/2023: pressure ulcers of right heel, ulcers on toes History of Present Illness (HPI) Admission 01/28/2022 Ms. 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 She states she has tried Medihoney and silver alginate in the past to this area with little benefit. She also has 2 small areas limited to skin breakdown to her lower extremities bilaterally. She has chronic venous insufficiency but not has not been wearing her compression stockings. She states she bumped her  legs against an object and not so the wound started. She has been using Medihoney to the sites. She denies signs of infection. 6/2; patient presents for follow-up. She had an x-ray of her right foot done at last clinic visit and this was negative for evidence of osteomyelitis. She also  had a wound culture done that showed extra high levels of Staph aureus. I recommended Keystone antibiotics for this and this was ordered. She had ABIs with TBI's done as well that showed monophasic waveforms to the right foot with TBI of 0 and an ABI of 0.52. Urgent referral was made to vein and vascular and she saw Dr. Durwin Nora on 6/1, yesterday and he recommended an arteriogram. This is scheduled for 6/16. Patient also reports a new wound to the right great toe. This is a blister that has ruptured. She also reports increased erythema to the toe. 6/6; the patient was worked in urgently today at the insistence of her daughter out of concern for a new wound on the lateral part of the plantar right great toe. She has her original postsurgical wound after the amputation of the right second toe, she has a wound on the medial part of the right great toe. The patient is apparently going for an angiogram by Dr. Durwin Nora in 2 weeks time. 6/13; patient presents for follow-up. She has been using bacitracin to the abrasion on the right great toe. She has been using collagen and Keystone antibiotic to the amputation site. She has no issues or complaints today. She denies signs of infection. 6/22; patient presents for follow-up. She states that her abdominal aortogram was canceled due to her renal function. She has been using Keystone antibiotics to the amputation site and Medihoney to the right great toe wound. At the pace of the right great toe she has a slitlike open area that she thinks was caused by the tape from the dressing. 6/29; patient presents for follow-up. She has been using Keystone antibiotics to the amputation site along with collagen. She has been using Medihoney to the right great toe wound. She has no other wounds. She denies signs of infection. 7/13; patient presents for follow-up. She has been using Keystone antibiotics and collagen to the wound sites. She currently denies signs of infection.  She states she is scheduled to see me nephrology next month. 7/20; patient presents for follow-up. She continue Keystone antibiotics and collagen to the wound sites. She has no issues or complaints today. 8/1; patient presents for follow up. She continues to use keystone antibiotics and collagen to the wound sites. She has no issues or complaints today. 8/15; patient presents for follow-up. She has been using Keystone antibiotics and collagen to the wound sites. She followed up with her nephrologist who made medication changes. She is supposed to get a repeat BMP in 2 weeks. Her decrease in renal function was a limiting factor in obtaining an arteriogram for potential intervention for revascularization. She currently denies signs of infection. 9/2; patient presents for follow-up. She has been using Keystone antibiotic and collagen to the wound beds. She has no issues or complaints today. Reports there has been improvement in kidney function however not cleared to have her arteriogram just yet. 10/10; patient presents for follow-up. She has been using Keystone antibiotic and collagen to the wound beds. She reports 2 new wounds 1 to the anterior right lower extremity and another to the plantar aspect of the right foot. She states that the right plantar foot wound was  caused by the home health nurse changing the dressing and causing a skin tear. She is not sure how the right anterior leg wound started. It appears to be from trauma. She denies signs of infection. 10/19; patient presents for follow-up. She has been using Keystone antibiotic and collagen to the right great toe wound. She is been using silver alginate to the right anterior and right plantar foot wound. She has been using Tubigrip to the right lower extremity. The plantar foot wound has healed. She has no issues or complaints today. 11/3; since the patient was last here she was seen in urgent care apparently for an area on the dorsal aspect  of the right fifth toe perhaps over the PIP. I saw a picture of this on the daughter's cell phone. There was slough on this. Urgent care gave him doxycycline. She is also changed the dressing to all wounds back the [facility] and collagen which includes her right leg and left first toe [name], [name] ([id number]) [phone].pdf Page 11 of 17 11/16; patient presents for follow-up. She has a new wound to the left knee. She states she fell. She has been using antibiotic ointment and collagen to this area. She has been using collagen and Keystone antibiotic to the right great toe wound. The anterior right leg wound is healed. She denies signs of infection. 11/30; patient presents for follow-up. The right great toe wound has healed. She has 1 remaining wound to the left knee. She has been using Hydrofera Blue and Medihoney here. 12/19; patient presents for follow-up. Her left knee wound has healed. She has no issues or complaints today. 09/30/2022 Patient's daughter called for an appointment due to increased swelling to her lower extremities bilaterally. Today patient presents with increased swelling to her lower extremities although there is no increased redness or warmth. She was evaluated by her nephrologist who increased her diuretics. Per patient and daughter her swelling has gone down to the leg Over the past several days. She does not have any open wounds. She was advised to elevate her legs and not consume excess salt By her PCP and nephrologist. Patient has compression stockings that she has been using sporadically. READMISSION 03/10/2023 Since her last visit to the clinic, the patient has been hospitalized at least twice, in February with COVID pneumonia and CHF exacerbation. She was discharged to a skilled nursing facility and then readmitted in April with fevers. She was noted to have pressure ulcers on her heels and sacrum. Per family declined skilled nursing placement  upon discharge and she has apparently been residing at home. She has followed with the vascular surgery clinic regarding peripheral artery disease and due to ulcerations on her feet, she ultimately underwent arteriography with balloon angioplasties of the superficial femoral artery, popliteal artery and peroneal artery on the left, along with shockwave ultrasound assisted balloon angioplasty of the popliteal artery and distal SFA. Given her multiple significant medical comorbidities, she is not a candidate for open surgery. She is deemed to be maximally vascularized this procedure, which was performed on 04 March 2023. T oday, she has a stage III pressure ulcer on her left heel and wounds on the PIP joints of her right third and left second toes. No sacral wound is present and the ulcer on her left heel has closed. 03/18/2023: She has a new ulcer on her left great toe. It was just noticed yesterday and the etiology is unknown. The fat layer is exposed and there is some slough accumulation.  The other wounds are all essentially unchanged in size. There is a little bit more granulation tissue on the other toe wounds, but bone is still frankly exposed on both sides. The heel ulcer has thick slough accumulation. 03/26/2023: She has a new wound on the tip of her left third toe. It is black. Neither the patient nor her daughter are aware of how it began. The other wounds are stable. 03/31/2023: The culture that I took from her left third toe returned with MRSA. We contacted the patient's daughter to have her stop levofloxacin and start doxycycline, but she has not yet picked up the new antibiotic. All of her wounds have accumulated slough. Bone remains exposed. She is scheduled to follow- up with vascular surgery tomorrow to evaluate the patency of her revascularization. 04/09/2023: She saw her vascular surgeon and her ABIs are improved; her TBI's could not be checked due to her bandages. She did finally start  taking the prescribed doxycycline. All of her wounds look about the same today. 04/23/2023: No significant changes to any of her wounds aside from being slightly smaller other than the left dorsal great toe wound, which is stable. She has accumulated slough and eschar on all of the surfaces. 04/30/2023: All of the wounds are stable with the exception of the left dorsal great toe wound which now has tendon exposed in addition to bone. There is slough and eschar accumulation on all sites. 05/15/2023: The patient was in the hospital and missed her appointment last week. The wound at the tip of her left third toe is healed. Everything else looks the same. 05/21/2023: No real change to any of the wounds. 05/28/2023: Once again, there has been no real change to any of her wounds except that the wound at the tip of her left great toe is closed. 06/11/2019: No change to any of her wounds. 06/25/2023: The wound on her right great toe nailbed has healed. The other wounds on her toes are unchanged and continue to have bone/joints exposed. The ulcer on her heel is smaller and shallower with very little necrotic tissue present. She fell last week and split her knee open. Apparently she presented to the ED greater than 12 hours after injury and they attempted to loosely suture the site. The sutures have pulled through her skin and they are doing absolutely nothing at this point. The wound is full of nonviable tissue. The muscle fascia is visible. Her PCP has her on doxycycline. 07/08/2023: The wounds on her feet are basically unchanged although the heel ulcer may be slightly smaller. The wound on her left knee looks much better this week. It is cleaner but still has a layer of slough on the surface. I can no longer see the muscle fascia. 07/22/2023: The heel ulcer is smaller and shallower. The left knee wound has also closed up considerably with just a small superficial open area that remains. The wounds on her toes  are unchanged with persistent bone exposure. Patient History Information obtained from Patient, Caregiver, Chart. Family History Cancer - Siblings, Heart Disease, Stroke - Mother, No family history of Diabetes. Social History Former smoker - quit 2003, Marital Status - Widowed, Alcohol Use - Never, Drug Use - No History, Caffeine Use - Never. Medical History Eyes Patient has history of Cataracts Hematologic/Lymphatic Patient has history of Anemia Respiratory Denies history of Chronic Obstructive Pulmonary Disease (COPD) Cardiovascular Patient has history of Arrhythmia - a-fib, Congestive Heart Failure, Hypotension, Peripheral Venous Disease Musculoskeletal Patient has history  of 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 Neurologic Patient has history of Neuropathy Hospitalization/Surgery History - Abdominal aortogram w/lower extremity. - Amputation toe (Right). - Colonoscopy. - Elbow surgery. - Laparoscopic [name], [name] ([id number]) [id number] Page 12 of 17 hysterectomy. Medical A Surgical History Notes nd Hematologic/Lymphatic Thrombocytopenia, Anticoagulant long-term use Respiratory Pulmonary embolus Gastrointestinal Hiatal hernia Endocrine Hyperthyroidism, Hypothyroidism Genitourinary CKD stage III Musculoskeletal arthritis Objective Constitutional Hypertensive, asymptomatic. no acute distress. Vitals Time Taken: 2:51 PM, Height: 64 in, Weight: 157 lbs, BMI: 26.9, Temperature: 97.9 F, Pulse: 69 bpm, Respiratory Rate: 20 breaths/min, Blood Pressure: 163/57 mmHg. Respiratory Normal work of breathing on supplemental oxygen. General Notes: 07/22/2023: The heel ulcer is smaller and shallower. The left knee wound has also closed up considerably, with just a small superficial open area that remains. The wounds on her toes are unchanged with persistent bone exposure. Integumentary (Hair, Skin) Wound #10 status is Open. Original cause of  wound was Pressure Injury. The date acquired was: 10/09/2022. The wound has been in treatment 19 weeks. The wound is located on the Left T Second. The wound measures 0.4cm length x 0.8cm width x 0.2cm depth; 0.251cm^2 area and 0.05cm^3 volume. There is oe bone, joint, and Fat Layer (Subcutaneous Tissue) exposed. There is no tunneling or undermining noted. There is a medium amount of serous drainage noted. The wound margin is distinct with the outline attached to the wound base. There is large (67-100%) red granulation within the wound bed. There is a small (1-33%) amount of necrotic tissue within the wound bed including Adherent Slough. The periwound skin appearance had no abnormalities noted for texture. The periwound skin appearance had no abnormalities noted for moisture. The periwound skin appearance had no abnormalities noted for color. Periwound temperature was noted as No Abnormality. The periwound has tenderness on palpation. Wound #11 status is Open. Original cause of wound was Pressure Injury. The date acquired was: 10/09/2022. The wound has been in treatment 19 weeks. The wound is located on the Right T Second. The wound measures 0.4cm length x 0.9cm width x 0.1cm depth; 0.283cm^2 area and 0.028cm^3 volume. There is oe bone, joint, and Fat Layer (Subcutaneous Tissue) exposed. There is no tunneling or undermining noted. There is a medium amount of serous drainage noted. The wound margin is distinct with the outline attached to the wound base. There is medium (34-66%) pink granulation within the wound bed. There is a small (1- 33%) amount of necrotic tissue within the wound bed including Adherent Slough. The periwound skin appearance had no abnormalities noted for texture. The periwound skin appearance had no abnormalities noted for moisture. The periwound skin appearance had no abnormalities noted for color. Periwound temperature was noted as No Abnormality. The periwound has tenderness on  palpation. Wound #13 status is Open. Original cause of wound was Gradually Appeared. The date acquired was: 03/24/2023. The wound has been in treatment 16 weeks. The wound is located on the Left,Dorsal T Great. The wound measures 1cm length x 1.6cm width x 0.2cm depth; 1.257cm^2 area and 0.251cm^3 volume. oe There is bone and Fat Layer (Subcutaneous Tissue) exposed. There is no tunneling or undermining noted. There is a medium amount of serous drainage noted. The wound margin is distinct with the outline attached to the wound base. There is small (1-33%) red granulation within the wound bed. There is a large (67-100%) amount of necrotic tissue within the wound bed including Adherent Slough and Necrosis of Bone. The periwound skin appearance had no abnormalities noted  for texture. The periwound skin appearance had no abnormalities noted for moisture. The periwound skin appearance had no abnormalities noted for color. Periwound temperature was noted as No Abnormality. The periwound has tenderness on palpation. Wound #16 status is Open. Original cause of wound was Trauma. The date acquired was: 06/18/2023. The wound has been in treatment 3 weeks. The wound is located on the Left Knee. The wound measures 0.9cm length x 1.5cm width x 0.1cm depth; 1.06cm^2 area and 0.106cm^3 volume. There is Fat Layer (Subcutaneous Tissue) exposed. There is no tunneling or undermining noted. There is a medium amount of serosanguineous drainage noted. The wound margin is distinct with the outline attached to the wound base. There is large (67-100%) red granulation within the wound bed. There is a small (1-33%) amount of necrotic tissue within the wound bed including Adherent Slough. The periwound skin appearance had no abnormalities noted for texture. The periwound skin appearance had no abnormalities noted for moisture. The periwound skin appearance had no abnormalities noted for color. Periwound temperature was noted as No  Abnormality. The periwound has tenderness on palpation. Wound #9 status is Open. Original cause of wound was Pressure Injury. The date acquired was: 10/22/2022. The wound has been in treatment 19 weeks. The wound is located on the Left Calcaneus. The wound measures 0.3cm length x 0.3cm width x 0.3cm depth; 0.071cm^2 area and 0.021cm^3 volume. There is Fat Layer (Subcutaneous Tissue) exposed. There is no tunneling or undermining noted. There is a small amount of serous drainage noted. The wound margin is distinct with the outline attached to the wound base. There is small (1-33%) pink, pale granulation within the wound bed. There is a large (67-100%) amount of necrotic tissue within the wound bed including Adherent Slough. The periwound skin appearance had no abnormalities noted for moisture. The periwound skin appearance had no abnormalities noted for color. The periwound skin appearance exhibited: Callus. Periwound temperature was noted as No Abnormality. The periwound has tenderness on palpation. 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 There was a Selective/Open Wound Non-Viable Tissue oe Debridement with a total area of 0.25 sq cm performed by Duanne Guess, MD. With the following  instrument(s): Curette to remove Non-Viable tissue/material. Material removed includes [facility] after achieving pain control using Lidocaine 4% Topical Solution. No specimens were taken. A time out was conducted at 15:05, prior to the start of the procedure. A Minimum amount of bleeding was controlled with Pressure. The procedure was tolerated well with a pain level of 0 throughout and a pain level of 0 following the procedure. Post Debridement Measurements: 0.4cm length x 0.8cm width x 0.2cm depth; 0.05cm^3 volume. Post debridement Stage noted as Category/Stage IV. Character of Wound/Ulcer Post Debridement is improved. Post procedure Diagnosis Wound #10: Same as Pre-Procedure Wound #11 Pre-procedure diagnosis of Wound #11 is a Pressure Ulcer located on the Right T Second . There was a Selective/Open Wound Non-Viable Tissue oe  Debridement with a total area of 0.28 sq cm performed by Duanne Guess, MD. With the following instrument(s): Curette to remove Non-Viable tissue/material. Material removed includes [facility] after achieving pain control using Lidocaine 4% Topical Solution. No specimens were taken. A time out was conducted at 15:05, prior to the start of the procedure. A Minimum amount of bleeding was controlled with Pressure. The procedure was tolerated well with a pain level of 0 throughout and a pain level of 0 following the procedure. Post Debridement Measurements: 0.4cm length x 0.9cm width x 0.1cm depth; 0.028cm^3 volume. Post debridement Stage noted as Category/Stage IV. Character of Wound/Ulcer Post Debridement is improved. 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 There was a Selective/Open Wound Non-Viable Tissue Debridement with a total area of 1.26 sq cm performed by Duanne Guess, MD.  With the following instrument(s): Curette to remove Non-Viable tissue/material. Material removed includes [facility] after achieving pain control using Lidocaine 4% Topical Solution. No specimens were taken. A time out was conducted at 15:05, prior to the start of the procedure. A Minimum amount of bleeding was controlled with Pressure. The procedure was tolerated well with a pain level of 0 throughout and a pain level of 0 following the procedure. Post Debridement Measurements: 1cm length x 1.6cm width x 0.1cm depth; 0.126cm^3 volume. Character of Wound/Ulcer Post Debridement is improved. Severity of Tissue Post Debridement is: Necrosis of bone. Post procedure Diagnosis Wound #13: Same as Pre-Procedure Wound #16 Pre-procedure diagnosis of Wound #16 is an Abrasion located on the Left Knee . There was a Excisional Skin/Subcutaneous Tissue Debridement with a total area of 1.06 sq cm performed by Duanne Guess, MD. With the following instrument(s): Curette to remove Viable and Non-Viable tissue/material. Material removed includes Subcutaneous Tissue and Slough and after achieving pain control using Lidocaine 4% T opical Solution. No specimens were taken. A time out was conducted at 15:05, prior to the start of the procedure. A Minimum amount of bleeding was controlled with Pressure. The procedure was tolerated well with a pain level of 0 throughout and a pain level of 0 following the procedure. Post Debridement Measurements: 0.9cm length x 1.5cm width x 0.1cm depth; 0.106cm^3 volume. Character of Wound/Ulcer Post Debridement is improved. Post procedure Diagnosis Wound #16: Same as Pre-Procedure Wound #9 Pre-procedure diagnosis of Wound #9 is a Pressure Ulcer located on the Left Calcaneus . There was a Excisional Skin/Subcutaneous Tissue Debridement with a total area of 0.07 sq cm performed by Duanne Guess, MD. With the following instrument(s): Curette to remove Viable and Non-Viable  tissue/material. Material removed includes Subcutaneous Tissue and Slough and after achieving pain control using Lidocaine 4% T opical Solution. No specimens were taken. A time out was conducted at 15:05, prior to the start of the procedure. A Minimum amount of bleeding was controlled with Pressure. The procedure was tolerated well with a pain level of 0 throughout and a pain level of 0 following the procedure. Post Debridement Measurements: 0.3cm length x 0.3cm width x 0.1cm depth; 0.007cm^3 volume. Post debridement Stage noted as Category/Stage III. Character of Wound/Ulcer Post Debridement is improved. Post procedure Diagnosis Wound #9: Same as Pre-Procedure Plan Follow-up Appointments: Return Appointment in 2 weeks. - Dr. Lady Gary - room 1 Wed 11/20 @ 2:45 pm Anesthetic: (In clinic) Topical Lidocaine 4% applied to wound bed Bathing/ Shower/ Hygiene: May  shower and wash wound with soap and water. Additional Orders / Instructions: Follow Nutritious Diet - add in protein shakes every day to diet - recommend premier protein 500 mg x3 a day vitamin C, zinc 30-50 mg per day Home Health: No change in wound care orders this week; continue Home Health for wound care. May utilize formulary equivalent dressing for wound treatment orders unless otherwise specified. Other Home Health Orders/Instructions: [facility] [place], Shriya [name] ([id number]) [id number] Page 14 of 17 WOUND #10: - T Second oe Wound Laterality: Left Cleanser: Soap and Water 1 x Per Day/30 Days Discharge Instructions: May shower and wash wound with dial antibacterial soap and water prior to dressing change. Cleanser: Wound Cleanser 1 x Per Day/30 Days Discharge Instructions: Cleanse the wound with wound cleanser prior to applying a clean dressing using gauze sponges, not tissue or cotton balls. Peri-Wound Care: Sween Lotion (Moisturizing lotion) 1 x Per Day/30 Days Discharge Instructions: Apply moisturizing  lotion as directed Prim Dressing: Promogran Prisma Matrix, 4.34 (sq in) (silver collagen) (Dispense As Written) 1 x Per Day/30 Days ary Discharge Instructions: Moisten collagen with saline or hydrogel Secondary Dressing: Woven Gauze Sponge, Non-Sterile 4x4 in (Generic) 1 x Per Day/30 Days Discharge Instructions: Apply over primary dressing as directed. Secured With: [employer], 4.5x3.1 (in/yd) (Generic) 1 x Per Day/30 Days Discharge Instructions: Secure with Kerlix as directed. Secured With: 10M Medipore H Soft Cloth Surgical T ape, 4 x 10 (in/yd) (Generic) 1 x Per Day/30 Days Discharge Instructions: Secure with tape as directed. WOUND #11: - T Second Wound Laterality: Right oe Cleanser: Soap and Water 1 x Per Day/30 Days Discharge Instructions: May shower and wash wound with dial antibacterial soap and water prior to dressing change. Cleanser: Wound Cleanser 1 x Per Day/30 Days Discharge Instructions: Cleanse the wound with wound cleanser prior to applying a clean dressing using gauze sponges, not tissue or cotton balls. Prim Dressing: Promogran Prisma Matrix, 4.34 (sq in) (silver collagen) (Dispense As Written) 1 x Per Day/30 Days ary Discharge Instructions: Moisten collagen with saline or hydrogel Secondary Dressing: Woven Gauze Sponge, Non-Sterile 4x4 in (Generic) 1 x Per Day/30 Days Discharge Instructions: Apply over primary dressing as directed. Secured With: Insurance underwriter, Sterile 2x75 (in/in) (Generic) 1 x Per Day/30 Days Discharge Instructions: Secure with stretch gauze as directed. Secured With: 10M Medipore H Soft Cloth Surgical T ape, 4 x 10 (in/yd) (Generic) 1 x Per Day/30 Days Discharge Instructions: Secure with tape as directed. WOUND #13: - T Great Wound Laterality: Dorsal, Left oe Cleanser: Soap and Water 1 x Per Day/30 Days Discharge Instructions: May shower and wash wound with dial antibacterial soap and water prior to dressing  change. Cleanser: Wound Cleanser 1 x Per Day/30 Days Discharge Instructions: Cleanse the wound with wound cleanser prior to applying a clean dressing using gauze sponges, not tissue or cotton balls. Prim Dressing: Promogran Prisma Matrix, 4.34 (sq in) (silver collagen) (Dispense As Written) 1 x Per Day/30 Days ary Discharge Instructions: Moisten collagen with saline or hydrogel Secondary Dressing: Woven Gauze Sponge, Non-Sterile 4x4 in (Generic) 1 x Per Day/30 Days Discharge Instructions: Apply over primary dressing as directed. Secured With: 10M Medipore H Soft Cloth Surgical T ape, 4 x 10 (in/yd) (Generic) 1 x Per Day/30 Days Discharge Instructions: Secure with tape as directed. WOUND #16: - Knee Wound Laterality: Left Cleanser: Normal Saline (Generic) 3 x Per Week/30 Days Discharge Instructions: Cleanse the wound with Normal Saline prior to applying a clean  dressing using gauze sponges, not tissue or cotton balls. Prim Dressing: Hydrofera Blue Ready Transfer Foam, 4x5 (in/in) 3 x Per Week/30 Days ary Discharge Instructions: Apply to wound bed as instructed Secondary Dressing: Zetuvit Plus Silicone Border Dressing 5x5 (in/in) 3 x Per Week/30 Days Discharge Instructions: Apply silicone border over primary dressing as directed. WOUND #9: - Calcaneus Wound Laterality: Left Cleanser: Soap and Water 1 x Per Day/30 Days Discharge Instructions: May shower and wash wound with dial antibacterial soap and water prior to dressing change. Cleanser: Wound Cleanser 1 x Per Day/30 Days Discharge Instructions: Cleanse the wound with wound cleanser prior to applying a clean dressing using gauze sponges, not tissue or cotton balls. Prim Dressing: Promogran Prisma Matrix, 4.34 (sq in) (silver collagen) (Dispense As Written) 1 x Per Day/30 Days ary Discharge Instructions: Moisten collagen with saline or hydrogel Secondary Dressing: ALLEVYN Heel 4 1/2in x 5 1/2in / 10.5cm x 13.5cm (Generic) 1 x Per Day/30  Days Discharge Instructions: Apply over primary dressing as directed. Secondary Dressing: Woven Gauze Sponge, Non-Sterile 4x4 in (Generic) 1 x Per Day/30 Days Discharge Instructions: Apply over primary dressing as directed. Secured With: [employer], 4.5x3.1 (in/yd) (Generic) 1 x Per Day/30 Days Discharge Instructions: Secure with Kerlix as directed. Secured With: 78M Medipore H Soft Cloth Surgical T ape, 4 x 10 (in/yd) (Generic) 1 x Per Day/30 Days Discharge Instructions: Secure with tape as directed. 07/22/2023: The heel ulcer is smaller and shallower. The left knee wound has also closed up considerably with just a small superficial open area that remains. The wounds on her toes are unchanged with persistent bone exposure. I used a curette to debride slough and subcutaneous tissue from the heel and the knee. I debrided slough from the toe wounds. The patient's daughter asked me today if I thought the toe wounds would ever close; I was quite honest with her that I do not think there is any chance of getting these wounds to close given the patient's significant comorbidities and the fact there is absolutely no tissue scaffold on the bones over which skin could potentially grow. I am primarily using the collagen to keep the bone and periosteum moist and avoid complete desiccation. We will continue to pack the heel wound with collagen and apply Hydrofera Blue to the knee. She will follow-up in 1 week. Electronic Signature(s) Signed: 08/03/2023 4:39:09 PM By: Duanne Guess MD FACS Signed: 09/07/2023 9:20:26 AM By: Shawn Stall RN, BSN Previous Signature: 07/22/2023 3:48:39 PM Version By: Duanne Guess MD FACS Entered By: Shawn Stall on 07/29/2023 16:33:03 Pettibone, [name] ([id number]) [id number] Page 15 of 17 -------------------------------------------------------------------------------- HxROS Details Patient Name: Date of Service: SKYLUR, ZOPPI  07/22/2023 2:45 PM Medical Record Number: [id number] Patient Account Number: [id number] Date of Birth/Sex: Treating RN: 1928-01-02 (89 y.o. F) Primary Care Provider: [name] Other Clinician: Referring Provider: Treating Provider/Extender: Tiajuana Amass in Treatment: 19 Information Obtained From Patient Caregiver Chart Eyes Medical History: Positive for: Cataracts Hematologic/Lymphatic Medical History: Positive for: Anemia Past Medical History Notes: Thrombocytopenia, Anticoagulant long-term use Respiratory Medical History: Negative for: Chronic Obstructive Pulmonary Disease (COPD) Past Medical History Notes: Pulmonary embolus Cardiovascular Medical History: Positive for: Arrhythmia - a-fib; Congestive Heart Failure; Hypotension; Peripheral Venous Disease Gastrointestinal Medical History: Past Medical History Notes: Hiatal hernia Endocrine Medical History: Past Medical History Notes: Hyperthyroidism, Hypothyroidism Genitourinary Medical History: Past Medical History Notes: CKD stage III Musculoskeletal Medical History: Positive for: 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 Past Medical  History Notes: arthritis Neurologic Medical History: Positive for: Neuropathy HBO Extended History Items Eyes: Cataracts Immunizations Tomlin, [name] ([id number]) [id number] Page 16 of 17 Pneumococcal Vaccine: Received Pneumococcal Vaccination: No Implantable Devices None Hospitalization / Surgery History Type of Hospitalization/Surgery Abdominal aortogram w/lower extremity Amputation toe (Right) Colonoscopy Elbow surgery Laparoscopic hysterectomy Family and Social History Cancer: Yes - Siblings; Diabetes: No; Heart Disease: Yes; Stroke: Yes - Mother; Former smoker - quit 2003; Marital Status - Widowed; Alcohol Use: Never; Drug Use: No History; Caffeine Use: Never; Financial Concerns: No; Food, Clothing or  Shelter Needs: No; Support System Lacking: No; Transportation 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 07/22/2023 Medical Record Number: [id number] Patient Account Number: [id number] Date of Birth/Sex: Treating RN: 1928-01-06 (89 y.o. 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 without evidence of necrosis I73.9 Peripheral vascular disease, unspecified I50.32 Chronic diastolic (congestive) heart failure N18.32 Chronic kidney disease, stage 3b I87.2 Venous insufficiency (chronic) (peripheral) Facility Procedures : CPT4 Code Description: [id number] - DEB SUBQ TISSUE 20 SQ CM/< ICD-10 Diagnosis Description L89.623 Pressure ulcer of left heel, stage 3 L97.825 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 involvement Modifier: without evidence o Quantity: 1 f necrosis : CPT4 Code Description: [id number] - DEBRIDE WOUND 1ST 20 SQ CM OR < ICD-10 Diagnosis Description 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 witho L97.526 Non-pressure chronic ulcer of other part of left foot  with bone involvement withou Modifier:  ut evidence of necr t evidence of necro Quantity: 1 osis sis Physician Procedures : CPT4 Code Description Modifier NEETA, MATIAS ([id number]) [id number] - WC PHYS LEVEL 4 - EST PT ICD-10 Diagnosis Description L89.623 Pressure ulcer of left heel, stage 3 L97.516 Non-pressure chronic ulcer of  other part of right foot with bone involvement without evidence of necrosi 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 without evidence of necrosis L97.825 Non-pressure chronic ulcer of other part of left lower leg  with muscle involvement without evidence of n Quantity: f Page 17 of 17 1 s ecrosis : [id number] 11042 - WC PHYS SUBQ TISS 20 SQ CM ICD-10 Diagnosis Description L89.623 Pressure ulcer of left heel, stage 3 L97.825 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

## 2023-07-23 NOTE — Progress Notes (Addendum)
[name], [name] ([id number]) [id number] Page 1 of 16 Visit Report for 07/22/2023 Arrival Information Details Patient Name: Date of Service: [name], [name] 07/22/2023 2:45 PM Medical Record Number: [id number] Patient Account Number: [id number] Date of Birth/Sex: Treating RN: 1927/09/22 (89 y.o. [name], [name] Primary Care [name]: [name] Other Clinician: Referring [name]: Treating [name]/Extender: [name] in Treatment: 19 Visit Information History Since Last Visit Added or deleted any medications: No Patient Arrived: Wheel Chair Any new allergies or adverse reactions: No Arrival Time: 14:44 Had a fall or experienced change in No Accompanied By: daughter activities of daily living that may affect Transfer Assistance: None risk of falls: Patient Identification Verified: Yes Signs or symptoms of abuse/neglect since No Secondary Verification Process Completed: Yes last visito Patient Requires Transmission-Based Precautions: No Hospitalized since last visit: No Patient Has Alerts: Yes Implantable device outside of the clinic No Patient Alerts: ABI R: 0.48 L: 0.46 2/24 excluding TBI R: 0.39 L: 0.21 2/24 cellular tissue based products placed in the center since last visit: Has Dressing in Place as Prescribed: Yes Has Footwear/Offloading in Place as Yes Prescribed: Left: Surgical Shoe with Pressure Relief Insole Right: Surgical Shoe with Pressure Relief Insole Pain Present Now: No Electronic Signature(s) Signed: 07/22/2023 5:10:59 PM By: Megan Deed RN, BSN Entered By: [name] on 07/22/2023 11:47:28 -------------------------------------------------------------------------------- Encounter Discharge Information Details Patient Name: Date of Service: Megan Reap. 07/22/2023 2:45 PM Medical Record Number: [id number] Patient Account Number: [id number] Date of Birth/Sex: Treating RN: [birth year] (89  y.o. [name] Primary Care [name]: [name] Other Clinician: Referring Yasmin Dibello: Treating [name]: [name] Weeks in Treatment: 19 Encounter Discharge Information Items Post Procedure Vitals Discharge Condition: Stable Temperature (F): 97.9 Ambulatory Status: Wheelchair Pulse (bpm): 69 Discharge Destination: Home Respiratory Rate (breaths/min): 18 Transportation: Private Auto Blood Pressure (mmHg): 163/57 Accompanied By: daughter Schedule Follow-up Appointment: Yes Clinical Summary of Care: Patient Declined Electronic Signature(s) Signed: 07/22/2023 5:10:59 PM By: Megan Deed RN, BSN Entered By: [name] on 07/22/2023 12:32:07 [name], [name] ([id number]) [id number] Page 2 of 16 -------------------------------------------------------------------------------- Lower Extremity Assessment Details Patient Name: Date of Service: [name] 07/22/2023 2:45 PM Medical Record Number: [id number] Patient Account Number: [id number] Date of Birth/Sex: Treating RN: October 31, 1927 (89 y.o. [name] Primary Care Donney Caraveo: [name] Other Clinician: Referring [name]: Treating [name]/Extender: [name] Weeks in Treatment: 19 Edema Assessment Assessed: [Left: No] [Right: No] Edema: [Left: Yes] [Right: Yes] Calf Left: Right: Point of Measurement: From Medial Instep 39 cm 36.5 cm Ankle Left: Right: Point of Measurement: From Medial Instep 24.5 cm 24 cm Vascular Assessment Pulses: Dorsalis Pedis Palpable: [Left:No] [Right:No] Extremity colors, hair growth, and conditions: Extremity Color: [Left:Hyperpigmented] [Right:Hyperpigmented] Hair Growth on Extremity: [Left:No] [Right:No] Temperature of Extremity: [Left:Warm < 3 seconds] [Right:Warm < 3 seconds] Electronic Signature(s) Signed: 07/22/2023 5:10:59 PM By: Megan Deed RN, BSN Entered By: [name] on  07/22/2023 11:53:32 -------------------------------------------------------------------------------- Multi Wound Chart Details Patient Name: Date of Service: Megan Reap. 07/22/2023 2:45 PM Medical Record Number: [id number] Patient Account Number: [id number] Date of Birth/Sex: Treating RN: 1927-09-16 (89 y.o. F) Primary Care [name]: [name] Other Clinician: Referring [name]: Treating Jathniel Smeltzer/Extender: [name] Weeks in Treatment: 19 Vital Signs Height(in): 64 Pulse(bpm): 69 Weight(lbs): 157 Blood Pressure(mmHg): 163/57 Body Mass Index(BMI): 26.9 Temperature(F): 97.9 Respiratory Rate(breaths/min): 20 [10:Photos:] [id number] Page 3 of 16] Left T Second oe Right T Second oe Left, Dorsal T Great oe Wound  Location: Pressure Injury Pressure Injury Gradually Appeared Wounding Event: Pressure Ulcer Pressure Ulcer Arterial Insufficiency Ulcer Primary Etiology: Cataracts, Anemia, Arrhythmia, Cataracts, Anemia, Arrhythmia, Cataracts, Anemia, Arrhythmia, Comorbid History: Congestive Heart Failure, Congestive Heart Failure, Congestive Heart Failure, Hypotension, Peripheral Venous Hypotension, Peripheral Venous Hypotension, Peripheral Venous Disease, Osteomyelitis, Neuropathy Disease, Osteomyelitis, Neuropathy Disease, Osteomyelitis, Neuropathy 10/09/2022 10/09/2022 03/24/2023 Date Acquired: 19 19 16  Weeks of Treatment: Open Open Open Wound Status: No No No Wound Recurrence: 0.4x0.8x0.2 0.4x0.9x0.1 1x1.6x0.2 Measurements L x W x D (cm) 0.251 0.283 1.257 A (cm) : rea 0.05 0.028 0.251 Volume (cm) : 64.50% 60.00% -1491.10% % Reduction in A rea: 29.60% 60.60% -3037.50% % Reduction in Volume: Category/Stage IV Category/Stage IV Full Thickness With Exposed Support Classification: Structures Medium Medium Medium Exudate A mount: Serous Serous Serous Exudate Type: amber amber amber Exudate Color: Distinct, outline  attached Distinct, outline attached Distinct, outline attached Wound Margin: Large (67-100%) Medium (34-66%) Small (1-33%) Granulation A mount: Red Pink Red Granulation Quality: Small (1-33%) Small (1-33%) Large (67-100%) Necrotic A mount: Fat Layer (Subcutaneous Tissue): Yes Fat Layer (Subcutaneous Tissue): Yes Fat Layer (Subcutaneous Tissue): Yes Exposed Structures: Joint: Yes Joint: Yes Bone: Yes Bone: Yes Bone: Yes Fascia: No Fascia: No Fascia: No Tendon: No Tendon: No Tendon: No Muscle: No Muscle: No Muscle: No Joint: No Small (1-33%) None Small (1-33%) Epithelialization: Debridement - Selective/Open Wound Debridement - Selective/Open Wound Debridement - Selective/Open Wound Debridement: Pre-procedure Verification/Time Out 15:05 15:05 15:05 Taken: Lidocaine 4% Topical Solution Lidocaine 4% Topical Solution Lidocaine 4% Topical Solution Pain Control: [facility] Tissue Debrided: Non-Viable Tissue Non-Viable Tissue Non-Viable Tissue Level: 0.25 0.28 1.26 Debridement A (sq cm): rea Curette Curette Curette Instrument: Minimum Minimum Minimum Bleeding: Pressure Pressure Pressure Hemostasis A chieved: 0 0 0 Procedural Pain: 0 0 0 Post Procedural Pain: Procedure was tolerated well Procedure was tolerated well Procedure was tolerated well Debridement Treatment Response: 0.4x0.8x0.2 0.4x0.9x0.1 1x1.6x0.1 Post Debridement Measurements L x W x D (cm) 0.05 0.028 0.126 Post Debridement Volume: (cm) Category/Stage IV Category/Stage IV N/A Post Debridement Stage: No Abnormalities Noted No Abnormalities Noted No Abnormalities Noted Periwound Skin Texture: Dry/Scaly: Yes Dry/Scaly: Yes No Abnormalities Noted Periwound Skin Moisture: No Abnormalities Noted Rubor: No Erythema: No Periwound Skin Color: No Abnormality No Abnormality No Abnormality Temperature: Yes Yes Yes Tenderness on Palpation: Debridement Debridement Debridement Procedures  Performed: Wound Number: [id number] N/A Photos: N/A Left Knee Left Calcaneus N/A Wound Location: Trauma Pressure Injury N/A Wounding Event: Abrasion Pressure Ulcer N/A Primary Etiology: Cataracts, Anemia, Arrhythmia, Cataracts, Anemia, Arrhythmia, N/A Comorbid History: Congestive Heart Failure, Congestive Heart Failure, Hypotension, Peripheral Venous Hypotension, Peripheral Venous Disease, Osteomyelitis, Neuropathy Disease, Osteomyelitis, Neuropathy 06/18/2023 10/22/2022 N/A Date Acquired: 3 19 N/A Weeks of Treatment: Open Open N/A Wound Status: No No N/A Wound Recurrence: [name], [name] ([id number]) [id number] Page 4 of 16 0.9x1.5x0.1 0.3x0.3x0.3 N/A Measurements L x W x D (cm) 1.06 0.071 N/A A (cm) : rea 0.106 0.021 N/A Volume (cm) : 92.50% 78.50% N/A % Reduction in Area: 97.50% 78.80% N/A % Reduction in Volume: Full Thickness Without Exposed Category/Stage II N/A Classification: Support Structures Medium Small N/A Exudate A mount: Serosanguineous Serous N/A Exudate Type: red, brown amber N/A Exudate Color: Distinct, outline attached Distinct, outline attached N/A Wound Margin: Large (67-100%) Small (1-33%) N/A Granulation A mount: Red Pink, Pale N/A Granulation Quality: Small (1-33%) Large (67-100%) N/A Necrotic A mount: Fat Layer (Subcutaneous Tissue): Yes Fat Layer (Subcutaneous Tissue): Yes N/A Exposed Structures: Fascia: No Fascia: No Tendon: No Tendon:  No Muscle: No Muscle: No Joint: No Joint: No Bone: No Bone: No Small (1-33%) Medium (34-66%) N/A Epithelialization: Debridement - Excisional Debridement - Excisional N/A Debridement: Pre-procedure Verification/Time Out 15:05 15:05 N/A Taken: Lidocaine 4% Topical Solution Lidocaine 4% Topical Solution N/A Pain Control: Subcutaneous, Slough Subcutaneous, Slough N/A Tissue Debrided: Skin/Subcutaneous Tissue Skin/Subcutaneous Tissue N/A Level: 1.06 0.07 N/A Debridement A  (sq cm): rea Curette Curette N/A Instrument: Minimum Minimum N/A Bleeding: Pressure Pressure N/A Hemostasis A chieved: 0 0 N/A Procedural Pain: 0 0 N/A Post Procedural Pain: Procedure was tolerated well Procedure was tolerated well N/A Debridement Treatment Response: 0.9x1.5x0.1 0.3x0.3x0.1 N/A Post Debridement Measurements L x W x D (cm) 0.106 0.007 N/A Post Debridement Volume: (cm) N/A Category/Stage II N/A Post Debridement Stage: No Abnormalities Noted Callus: Yes N/A Periwound Skin Texture: No Abnormalities Noted Dry/Scaly: Yes N/A Periwound Skin Moisture: Ecchymosis: No No Abnormalities Noted N/A Periwound Skin Color: No Abnormality No Abnormality N/A Temperature: Yes Yes N/A Tenderness on Palpation: Debridement Debridement N/A Procedures Performed: Treatment Notes Wound #10 (Toe Second) Wound Laterality: Left Cleanser Soap and Water Discharge Instruction: May shower and wash wound with dial antibacterial soap and water prior to dressing change. Wound Cleanser Discharge Instruction: Cleanse the wound with wound cleanser prior to applying a clean dressing using gauze sponges, not tissue or cotton balls. Peri-Wound Care Sween Lotion (Moisturizing lotion) Discharge Instruction: Apply moisturizing lotion as directed Topical Primary Dressing Promogran Prisma Matrix, 4.34 (sq in) (silver collagen) Discharge Instruction: Moisten collagen with saline or hydrogel Secondary Dressing Woven Gauze Sponge, Non-Sterile 4x4 in Discharge Instruction: Apply over primary dressing as directed. Secured With [employer], 4.5x3.1 (in/yd) Discharge Instruction: Secure with Kerlix as directed. 24M Medipore H Soft Cloth Surgical T ape, 4 x 10 (in/yd) Discharge Instruction: Secure with tape as directed. Compression Wrap Compression Stockings Add-Ons [name], [name] ([id number]) [id number] Page 5 of 16 Wound #11 (Toe Second) Wound Laterality:  Right Cleanser Soap and Water Discharge Instruction: May shower and wash wound with dial antibacterial soap and water prior to dressing change. Wound Cleanser Discharge Instruction: Cleanse the wound with wound cleanser prior to applying a clean dressing using gauze sponges, not tissue or cotton balls. Peri-Wound Care Topical Primary Dressing Promogran Prisma Matrix, 4.34 (sq in) (silver collagen) Discharge Instruction: Moisten collagen with saline or hydrogel Secondary Dressing Woven Gauze Sponge, Non-Sterile 4x4 in Discharge Instruction: Apply over primary dressing as directed. Secured With Conforming Stretch Gauze Bandage, Sterile 2x75 (in/in) Discharge Instruction: Secure with stretch gauze as directed. 24M Medipore H Soft Cloth Surgical T ape, 4 x 10 (in/yd) Discharge Instruction: Secure with tape as directed. Compression Wrap Compression Stockings Add-Ons Wound #13 (Toe Great) Wound Laterality: Dorsal, Left Cleanser Soap and Water Discharge Instruction: May shower and wash wound with dial antibacterial soap and water prior to dressing change. Wound Cleanser Discharge Instruction: Cleanse the wound with wound cleanser prior to applying a clean dressing using gauze sponges, not tissue or cotton balls. Peri-Wound Care Topical Primary Dressing Promogran Prisma Matrix, 4.34 (sq in) (silver collagen) Discharge Instruction: Moisten collagen with saline or hydrogel Secondary Dressing Woven Gauze Sponge, Non-Sterile 4x4 in Discharge Instruction: Apply over primary dressing as directed. Secured With 24M Medipore H Soft Cloth Surgical T ape, 4 x 10 (in/yd) Discharge Instruction: Secure with tape as directed. Compression Wrap Compression Stockings Add-Ons Wound #16 (Knee) Wound Laterality: Left Cleanser Normal Saline Discharge Instruction: Cleanse the wound with Normal Saline prior to applying a clean dressing using gauze sponges, not tissue or cotton  balls. Peri-Wound  Care Topical Primary Dressing Hydrofera Blue Ready Transfer Foam, 4x5 (in/in) Discharge Instruction: Apply to wound bed as instructed [name], [name] ([id number]) [id number] Page 6 of 16 Secondary Dressing Zetuvit Plus Silicone Border Dressing 5x5 (in/in) Discharge Instruction: Apply silicone border over primary dressing as directed. Secured With Compression Wrap Compression Stockings Add-Ons Wound #9 (Calcaneus) Wound Laterality: Left Cleanser Soap and Water Discharge Instruction: May shower and wash wound with dial antibacterial soap and water prior to dressing change. Wound Cleanser Discharge Instruction: Cleanse the wound with wound cleanser prior to applying a clean dressing using gauze sponges, not tissue or cotton balls. Peri-Wound Care Topical Primary Dressing Promogran Prisma Matrix, 4.34 (sq in) (silver collagen) Discharge Instruction: Moisten collagen with saline or hydrogel Secondary Dressing ALLEVYN Heel 4 1/2in x 5 1/2in / 10.5cm x 13.5cm Discharge Instruction: Apply over primary dressing as directed. Woven Gauze Sponge, Non-Sterile 4x4 in Discharge Instruction: Apply over primary dressing as directed. Secured With [employer], 4.5x3.1 (in/yd) Discharge Instruction: Secure with Kerlix as directed. 39M Medipore H Soft Cloth Surgical T ape, 4 x 10 (in/yd) Discharge Instruction: Secure with tape as directed. Compression Wrap Compression Stockings Add-Ons Electronic Signature(s) Signed: 07/22/2023 3:43:47 PM By: Duanne Guess MD FACS Entered By: Duanne Guess on 07/22/2023 12:43:47 -------------------------------------------------------------------------------- Multi-Disciplinary Care Plan Details Patient Name: Date of Service: Megan Reap. 07/22/2023 2:45 PM Medical Record Number: [id number] Patient Account Number: [id number] Date of Birth/Sex: Treating RN: April 18, 1928 (89 y.o. [name] Primary Care Tyna Huertas:  [name] Other Clinician: Referring Bobbye Petti: Treating Jaleia Hanke/Extender: [name] Weeks in Treatment: [address] Multidisciplinary Care Plan reviewed with physician [address] LEELA, CRACKEL ([id number]) [id number] Page 7 of 16 Abuse / Safety / Falls / Self Care Management Nursing Diagnoses: Impaired home maintenance Impaired physical mobility Potential for falls Goals: Patient/caregiver will verbalize/demonstrate measure taken to improve self care Date Initiated: 03/10/2023 Target Resolution Date: 08/07/2023 Goal Status: Active Patient/caregiver will verbalize/demonstrate measures taken to improve the patient's personal safety Date Initiated: 03/10/2023 Target Resolution Date: 08/07/2023 Goal Status: Active Interventions: Assess fall risk on admission and as needed Assess: immobility, friction, shearing, incontinence upon admission and as needed Provide education on fall prevention Notes: pt fell 06/18/23 injuring left knee Wound/Skin Impairment Nursing Diagnoses: Impaired tissue integrity Knowledge deficit related to ulceration/compromised skin integrity Goals: Patient/caregiver will verbalize understanding of skin care regimen Date Initiated: 03/10/2023 Target Resolution Date: 08/07/2023 Goal Status: Active Interventions: Assess patient/caregiver ability to obtain necessary supplies Assess patient/caregiver ability to perform ulcer/skin care regimen upon admission and as needed Assess ulceration(s) every visit Treatment Activities: Skin care regimen initiated : 03/10/2023 Topical wound management initiated : 03/10/2023 Notes: Electronic Signature(s) Signed: 07/22/2023 5:10:59 PM By: Megan Deed RN, BSN Entered By: [name] on 07/22/2023 12:05:02 -------------------------------------------------------------------------------- Pain Assessment Details Patient Name: Date of Service: Megan Reap. 07/22/2023 2:45  PM Medical Record Number: [id number] Patient Account Number: [id number] Date of Birth/Sex: Treating RN: Mar 19, 1928 (89 y.o. [name] Primary Care Efosa Treichler: [name] Other Clinician: Referring Dong Nimmons: Treating Malikai Gut/Extender: [name] Weeks in Treatment: 19 Active Problems Location of Pain Severity and Description of Pain Patient Has Paino No Site Locations Rate the pain. SHARNEL, PADOVANO ([id number]) [id number] Page 8 of 16 Rate the pain. Current Pain Level: 0 Character of Pain Describe the Pain: Tender Pain Management and Medication Current Pain Management: Medication: Yes Is the Current Pain Management Adequate: Adequate How does your wound impact your activities of daily livingo Sleep:  No Bathing: No Appetite: No Relationship With Others: No Bladder Continence: No Emotions: No Bowel Continence: No Work: No Toileting: No Drive: No Dressing: No Hobbies: No Electronic Signature(s) Signed: 07/22/2023 5:10:59 PM By: Megan Deed RN, BSN Entered By: [name] on 07/22/2023 11:53:14 -------------------------------------------------------------------------------- Patient/Caregiver Education Details Patient Name: Date of Service: Megan Reap 11/13/2024andnbsp2:45 PM Medical Record Number: [id number] Patient Account Number: [id number] Date of Birth/Gender: Treating RN: 1927-11-15 (89 y.o. [name] Primary Care Physician: [name] Other Clinician: Referring Physician: Treating Physician/Extender: [name] in Treatment: 19 Education Assessment Education Provided To: Patient Education Topics Provided Tissue Oxygenation: Methods: Explain/Verbal Responses: Reinforcements needed, State content correctly Wound/Skin Impairment: Methods: Explain/Verbal Responses: Reinforcements needed, State content correctly Electronic Signature(s) Signed: 07/22/2023  5:10:59 PM By: Megan Deed RN, BSN Lefeber, [name] ([id number]) [id number] Page 9 of 16 Entered By: [name] on 07/22/2023 12:05:26 -------------------------------------------------------------------------------- Wound Assessment Details Patient Name: Date of Service: JEANISE, MEMON 07/22/2023 2:45 PM Medical Record Number: [id number] Patient Account Number: [id number] Date of Birth/Sex: Treating RN: May 25, 1928 (89 y.o. [name], [name] Primary Care Niranjan Rufener: [name] Other Clinician: Referring Shellsea Borunda: Treating Rehan Holness/Extender: [name] Weeks in Treatment: 19 Wound Status Wound Number: 10 Primary Pressure Ulcer Etiology: Wound Location: Left T Second oe Wound Open Wounding Event: Pressure Injury Status: Date Acquired: 10/09/2022 Comorbid Cataracts, Anemia, Arrhythmia, Congestive Heart Failure, Weeks Of Treatment: 19 History: Hypotension, Peripheral Venous Disease, Osteomyelitis, Clustered Wound: No Neuropathy Photos Wound Measurements Length: (cm) 0.4 Width: (cm) 0.8 Depth: (cm) 0.2 Area: (cm) 0.251 Volume: (cm) 0.05 % Reduction in Area: 64.5% % Reduction in Volume: 29.6% Epithelialization: Small (1-33%) Tunneling: No Undermining: No Wound Description Classification: Category/Stage IV Wound Margin: Distinct, outline attached Exudate Amount: Medium Exudate Type: Serous Exudate Color: amber Foul Odor After Cleansing: No Slough/Fibrino No Wound Bed Granulation Amount: Large (67-100%) Exposed Structure Granulation Quality: Red Fascia Exposed: No Necrotic Amount: Small (1-33%) Fat Layer (Subcutaneous Tissue) Exposed: Yes Necrotic Quality: Adherent Slough Tendon Exposed: No Muscle Exposed: No Joint Exposed: Yes Bone Exposed: Yes Periwound Skin Texture Texture Color No Abnormalities Noted: Yes No Abnormalities Noted: Yes Moisture Temperature / Pain No Abnormalities Noted: Yes Temperature: No  Abnormality Tenderness on Palpation: Yes Treatment Notes Carver, Presly [name] ([id number]) [id number] Page 10 of 16 Wound #10 (Toe Second) Wound Laterality: Left Cleanser Soap and Water Discharge Instruction: May shower and wash wound with dial antibacterial soap and water prior to dressing change. Wound Cleanser Discharge Instruction: Cleanse the wound with wound cleanser prior to applying a clean dressing using gauze sponges, not tissue or cotton balls. Peri-Wound Care Sween Lotion (Moisturizing lotion) Discharge Instruction: Apply moisturizing lotion as directed Topical Primary Dressing Promogran Prisma Matrix, 4.34 (sq in) (silver collagen) Discharge Instruction: Moisten collagen with saline or hydrogel Secondary Dressing Woven Gauze Sponge, Non-Sterile 4x4 in Discharge Instruction: Apply over primary dressing as directed. Secured With [employer], 4.5x3.1 (in/yd) Discharge Instruction: Secure with Kerlix as directed. 64M Medipore H Soft Cloth Surgical T ape, 4 x 10 (in/yd) Discharge Instruction: Secure with tape as directed. Compression Wrap Compression Stockings Add-Ons Electronic Signature(s) Signed: 07/22/2023 5:10:59 PM By: Megan Deed RN, BSN Entered By: [name] on 07/22/2023 12:00:57 -------------------------------------------------------------------------------- Wound Assessment Details Patient Name: Date of Service: Megan Reap. 07/22/2023 2:45 PM Medical Record Number: [id number] Patient Account Number: [id number] Date of Birth/Sex: Treating RN: May 11, 1928 (89 y.o. [name] Primary Care Dorothey Oetken: [name] Other Clinician: Referring Gazelle Towe: Treating Shyler Holzman/Extender: Duanne Guess  [name] Weeks in Treatment: 19 Wound Status Wound Number: 11 Primary Pressure Ulcer Etiology: Wound Location: Right T Second oe Wound Open Wounding Event: Pressure Injury Status: Date Acquired:  10/09/2022 Comorbid Cataracts, Anemia, Arrhythmia, Congestive Heart Failure, Weeks Of Treatment: 19 History: Hypotension, Peripheral Venous Disease, Osteomyelitis, Clustered Wound: No Neuropathy Photos [name], [name] ([id number]) [id number] Page 11 of 16 Wound Measurements Length: (cm) 0.4 Width: (cm) 0.9 Depth: (cm) 0.1 Area: (cm) 0.283 Volume: (cm) 0.028 % Reduction in Area: 60% % Reduction in Volume: 60.6% Epithelialization: None Tunneling: No Undermining: No Wound Description Classification: Category/Stage IV Wound Margin: Distinct, outline attached Exudate Amount: Medium Exudate Type: Serous Exudate Color: amber Foul Odor After Cleansing: No Slough/Fibrino Yes Wound Bed Granulation Amount: Medium (34-66%) Exposed Structure Granulation Quality: Pink Fascia Exposed: No Necrotic Amount: Small (1-33%) Fat Layer (Subcutaneous Tissue) Exposed: Yes Necrotic Quality: Adherent Slough Tendon Exposed: No Muscle Exposed: No Joint Exposed: Yes Bone Exposed: Yes Periwound Skin Texture Texture Color No Abnormalities Noted: Yes No Abnormalities Noted: Yes Moisture Temperature / Pain No Abnormalities Noted: Yes Temperature: No Abnormality Tenderness on Palpation: Yes Treatment Notes Wound #11 (Toe Second) Wound Laterality: Right Cleanser Soap and Water Discharge Instruction: May shower and wash wound with dial antibacterial soap and water prior to dressing change. Wound Cleanser Discharge Instruction: Cleanse the wound with wound cleanser prior to applying a clean dressing using gauze sponges, not tissue or cotton balls. Peri-Wound Care Topical Primary Dressing Promogran Prisma Matrix, 4.34 (sq in) (silver collagen) Discharge Instruction: Moisten collagen with saline or hydrogel Secondary Dressing Woven Gauze Sponge, Non-Sterile 4x4 in Discharge Instruction: Apply over primary dressing as directed. Secured With Conforming Stretch Gauze Bandage,  Sterile 2x75 (in/in) Discharge Instruction: Secure with stretch gauze as directed. 69M Medipore H Soft Cloth Surgical T ape, 4 x 10 (in/yd) Discharge Instruction: Secure with tape as directed. Compression [address] Shandelle [name] ([id number]) [id number] Page 12 of 16 Compression Stockings Add-Ons Electronic Signature(s) Signed: 07/22/2023 5:10:59 PM By: Megan Deed RN, BSN Entered By: [name] on 07/22/2023 12:01:39 -------------------------------------------------------------------------------- Wound Assessment Details Patient Name: Date of Service: Megan Reap. 07/22/2023 2:45 PM Medical Record Number: [id number] Patient Account Number: [id number] Date of Birth/Sex: Treating RN: 02-Nov-1927 (89 y.o. 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, Dorsal T Great oe Wound Open Wounding Event: Gradually Appeared Status: Date Acquired: 03/24/2023 Comorbid Cataracts, Anemia, Arrhythmia, Congestive Heart Failure, Weeks Of Treatment: 16 History: Hypotension, Peripheral Venous Disease, Osteomyelitis, Clustered Wound: No Neuropathy Photos Wound Measurements Length: (cm) 1 Width: (cm) 1.6 Depth: (cm) 0.2 Area: (cm) 1.257 Volume: (cm) 0.251 % Reduction in Area: -1491.1% % Reduction in Volume: -3037.5% Epithelialization: Small (1-33%) Tunneling: No Undermining: No Wound Description Classification: Full Thickness With Exposed Supp Wound Margin: Distinct, outline attached Exudate Amount: Medium Exudate Type: Serous Exudate Color: amber ort Structures Foul Odor After Cleansing: No Slough/Fibrino Yes Wound Bed Granulation Amount: Small (1-33%) Exposed Structure Granulation Quality: Red Fascia Exposed: No Necrotic  Amount: Large (67-100%) Fat Layer (Subcutaneous Tissue) Exposed: Yes Necrotic Quality: Adherent Slough, Bone Tendon Exposed: No Muscle Exposed: No Joint Exposed: No Bone Exposed: Yes Periwound Skin Texture Texture Color No Abnormalities Noted: Yes No Abnormalities Noted: Yes [name], [name] ([id number]) [id number] Page 13 of 16 Moisture Temperature / Pain No Abnormalities Noted: Yes Temperature: No Abnormality Tenderness on Palpation: Yes Treatment Notes Wound #  13 (Toe Great) Wound Laterality: Dorsal, Left Cleanser Soap and Water Discharge Instruction: May shower and wash wound with dial antibacterial soap and water prior to dressing change. Wound Cleanser Discharge Instruction: Cleanse the wound with wound cleanser prior to applying a clean dressing using gauze sponges, not tissue or cotton balls. Peri-Wound Care Topical Primary Dressing Promogran Prisma Matrix, 4.34 (sq in) (silver collagen) Discharge Instruction: Moisten collagen with saline or hydrogel Secondary Dressing Woven Gauze Sponge, Non-Sterile 4x4 in Discharge Instruction: Apply over primary dressing as directed. Secured With 25M Medipore H Soft Cloth Surgical T ape, 4 x 10 (in/yd) Discharge Instruction: Secure with tape as directed. Compression Wrap Compression Stockings Add-Ons Electronic Signature(s) Signed: 07/22/2023 5:10:59 PM By: Megan Deed RN, BSN Entered By: [name] on 07/22/2023 12:02:14 -------------------------------------------------------------------------------- Wound Assessment Details Patient Name: Date of Service: Megan Reap. 07/22/2023 2:45 PM Medical Record Number: [id number] Patient Account Number: [id number] Date of Birth/Sex: Treating RN: August 29, 1928 (89 y.o. [name] Primary Care Wenona [place]: [name] Other Clinician: Referring Laia Wiley: Treating Nicolaas Savo/Extender: [name] Weeks in Treatment: 19 Wound  Status Wound Number: 16 Primary Abrasion Etiology: Wound Location: Left Knee Wound Open Wounding Event: Trauma Status: Date Acquired: 06/18/2023 Comorbid Cataracts, Anemia, Arrhythmia, Congestive Heart Failure, Weeks Of Treatment: 3 History: Hypotension, Peripheral Venous Disease, Osteomyelitis, Clustered Wound: No Neuropathy Photos [name], [name] ([id number]) [id number] Page 14 of 16 Wound Measurements Length: (cm) 0.9 Width: (cm) 1.5 Depth: (cm) 0.1 Area: (cm) 1.06 Volume: (cm) 0.106 % Reduction in Area: 92.5% % Reduction in Volume: 97.5% Epithelialization: Small (1-33%) Tunneling: No Undermining: No Wound Description Classification: Full Thickness Without Exposed Support Structures Wound Margin: Distinct, outline attached Exudate Amount: Medium Exudate Type: Serosanguineous Exudate Color: red, brown Foul Odor After Cleansing: No Slough/Fibrino Yes Wound Bed Granulation Amount: Large (67-100%) Exposed Structure Granulation Quality: Red Fascia Exposed: No Necrotic Amount: Small (1-33%) Fat Layer (Subcutaneous Tissue) Exposed: Yes Necrotic Quality: Adherent Slough Tendon Exposed: No Muscle Exposed: No Joint Exposed: No Bone Exposed: No Periwound Skin Texture Texture Color No Abnormalities Noted: Yes No Abnormalities Noted: Yes Moisture Temperature / Pain No Abnormalities Noted: Yes Temperature: No Abnormality Tenderness on Palpation: Yes Treatment Notes Wound #16 (Knee) Wound Laterality: Left Cleanser Normal Saline Discharge Instruction: Cleanse the wound with Normal Saline prior to applying a clean dressing using gauze sponges, not tissue or cotton balls. Peri-Wound Care Topical Primary Dressing Hydrofera Blue Ready Transfer Foam, 4x5 (in/in) Discharge Instruction: Apply to wound bed as instructed Secondary Dressing Zetuvit Plus Silicone Border Dressing 5x5 (in/in) Discharge Instruction: Apply silicone border over primary  dressing as directed. Secured With Compression Wrap Compression Stockings Facilities manager) Signed: 07/22/2023 5:10:59 PM By: Megan Deed RN, BSN Entered By: [name] on 07/22/2023 12:02:48 [name], [name] ([id number]) [id number] Page 15 of 16 -------------------------------------------------------------------------------- Wound Assessment Details Patient Name: Date of Service: [name], [name] 07/22/2023 2:45 PM Medical Record Number: [id number] Patient Account Number: [id number] Date of Birth/Sex: Treating RN: 1928/06/18 (89 y.o. [name] Primary Care Matheus Spiker: [name] Other Clinician: Referring Kahlee Metivier: Treating Shyla Gayheart/Extender: [name] Weeks in Treatment: 19 Wound Status Wound Number: 9 Primary Pressure Ulcer Etiology: Wound Location: Left Calcaneus Wound Open Wounding Event: Pressure Injury Status: Date Acquired: 10/22/2022 Comorbid Cataracts, Anemia, Arrhythmia, Congestive Heart Failure, Weeks Of Treatment: 19 History: Hypotension, Peripheral Venous Disease, Osteomyelitis, Clustered Wound: No Neuropathy Photos Wound Measurements Length: (cm) 0.3 Width: (cm) 0.3 Depth: (cm) 0.3 Area: (cm) 0.071 Volume: (cm) 0.021 % Reduction in Area:  78.5% % Reduction in Volume: 78.8% Epithelialization: Medium (34-66%) Tunneling: No Undermining: No Wound Description Classification: Category/Stage III Wound Margin: Distinct, outline attached Exudate Amount: Small Exudate Type: Serous Exudate Color: amber Foul Odor After Cleansing: No Slough/Fibrino Yes Wound Bed Granulation Amount: Small (1-33%) Exposed Structure Granulation Quality: Pink, Pale Fascia Exposed: No Necrotic Amount: Large (67-100%) Fat Layer (Subcutaneous Tissue) Exposed: Yes Necrotic Quality: Adherent Slough Tendon Exposed: No Muscle Exposed: No Joint Exposed: No Bone Exposed: No Periwound Skin Texture Texture  Color No Abnormalities Noted: No No Abnormalities Noted: Yes Callus: Yes Temperature / Pain Temperature: No Abnormality Moisture No Abnormalities Noted: Yes Tenderness on Palpation: Yes Electronic Signature(s) Signed: 07/28/2023 4:21:34 PM By: Megan Deed RN, BSN Previous Signature: 07/22/2023 5:10:59 PM Version By: Megan Deed RN, BSN Spraker, [name] ([id number]) [id number] Page 16 of 16 Entered By: [name] on 07/28/2023 13:08:03 -------------------------------------------------------------------------------- Vitals Details Patient Name: Date of Service: [name], [place] 07/22/2023 2:45 PM Medical Record Number: [id number] Patient Account Number: [id number] Date of Birth/Sex: Treating RN: [birth year] (89 y.o. [name] Primary Care Kemba Hoppes: [name] Other Clinician: Referring Jakiya Bookbinder: Treating Mattelyn Imhoff/Extender: [name] Weeks in Treatment: 19 Vital Signs Time Taken: 14:51 Temperature (F): 97.9 Height (in): 64 Pulse (bpm): 69 Weight (lbs): 157 Respiratory Rate (breaths/min): 20 Body Mass Index (BMI): 26.9 Blood Pressure (mmHg): 163/57 Reference Range: 80 - 120 mg / dl Electronic Signature(s) Signed: 07/22/2023 5:10:59 PM By: Megan Deed RN, BSN Entered By: [name] on 07/22/2023 11:51:55

## 2023-07-26 ENCOUNTER — Other Ambulatory Visit (INDEPENDENT_AMBULATORY_CARE_PROVIDER_SITE_OTHER): Payer: Self-pay | Admitting: Ophthalmology

## 2023-07-29 ENCOUNTER — Encounter (HOSPITAL_BASED_OUTPATIENT_CLINIC_OR_DEPARTMENT_OTHER): Payer: Medicare HMO | Admitting: General Surgery

## 2023-07-29 DIAGNOSIS — L89623 Pressure ulcer of left heel, stage 3: Secondary | ICD-10-CM | POA: Diagnosis not present

## 2023-07-30 NOTE — Progress Notes (Signed)
[name] ([id number]) [id number] Page 1 of 16 Visit Report for 07/29/2023 Chief Complaint Document Details Patient Name: Date of Service: Megan, [name] 07/29/2023 2:45 PM Medical Record Number: [id number] Patient Account Number: [id number] Date of Birth/Sex: Treating RN: 22-Jun-1928 (89 y.o. F) Primary Care Provider: [name] Other Clinician: Referring Provider: Treating Provider/Extender: Tiajuana Amass in Treatment: 20 Information Obtained from: Patient Chief Complaint 01/28/2022; right second toe amputation site dehiscence and bilateral lower extremity wounds. 03/10/2023: pressure ulcers of right heel, ulcers on toes Electronic Signature(s) Signed: 07/29/2023 3:50:22 PM By: Duanne Guess MD FACS Entered By: Duanne Guess on 07/29/2023 12:50:22 -------------------------------------------------------------------------------- Debridement Details Patient Name: Date of Service: [name]. 07/29/2023 2:45 PM Medical Record Number: [id number] Patient Account Number: [id number] Date of Birth/Sex: Treating RN: 07-Aug-1928 (89 y.o. [name] Primary Care Provider: [name] Other Clinician: Referring Provider: Treating Provider/Extender: Claudie Leach Weeks in Treatment: 20 Debridement Performed for Assessment: Wound #16 Left Knee Performed By: Physician Duanne Guess, MD The following information was scribed by: Zenaida Deed The information was scribed for: Duanne Guess Debridement Type: Debridement Level of Consciousness (Pre-procedure): Awake and Alert Pre-procedure Verification/Time Out Yes - 15:40 Taken: Start Time: 15:42 Pain Control: Lidocaine 4% T opical Solution Percent of Wound Bed Debrided: 100% T Area Debrided (cm): otal 0.77 Tissue and other material debrided: Viable, Non-Viable, Slough, Subcutaneous, Slough Level: Skin/Subcutaneous Tissue Debridement  Description: Excisional Instrument: Curette Bleeding: Minimum Hemostasis Achieved: Pressure Procedural Pain: 0 Post Procedural Pain: 0 Response to Treatment: Procedure was tolerated well Level of Consciousness (Post- Awake and Alert procedure): Post Debridement Measurements of Total Wound Length: (cm) 0.7 Width: (cm) 1.4 Depth: (cm) 0.1 [name], [name] ([id number]) [id number] Page 2 of 16 Volume: (cm) 0.077 Character of Wound/Ulcer Post Debridement: Improved Post Procedure Diagnosis Same as Pre-procedure Electronic Signature(s) Signed: 07/29/2023 3:57:12 PM By: Duanne Guess MD FACS Signed: 07/29/2023 5:11:33 PM By: Zenaida Deed RN, BSN Entered By: Zenaida Deed on 07/29/2023 12:44:16 -------------------------------------------------------------------------------- Debridement Details Patient Name: Date of Service: [name]. 07/29/2023 2:45 PM Medical Record Number: [id number] Patient Account Number: [id number] Date of Birth/Sex: Treating RN: 23-Mar-1928 (89 y.o. [name] Primary Care Provider: [name] Other Clinician: Referring Provider: Treating Provider/Extender: Claudie Leach Weeks in Treatment: 20 Debridement Performed for Assessment: Wound #13 Left,Dorsal T Great oe Performed By: Physician Duanne Guess, MD The following information was scribed by: Zenaida Deed The information was scribed for: Duanne Guess Debridement Type: Debridement Severity of Tissue Pre Debridement: Necrosis of bone Level of Consciousness (Pre-procedure): Awake and Alert Pre-procedure Verification/Time Out Yes - 15:40 Taken: Start Time: 15:42 Pain Control: Lidocaine 4% T opical Solution Percent of Wound Bed Debrided: 100% T Area Debrided (cm): otal 1.06 Tissue and other material debrided: Viable, Non-Viable, Slough, Subcutaneous, Slough Level: Skin/Subcutaneous Tissue Debridement Description:  Excisional Instrument: Curette Bleeding: Minimum Hemostasis Achieved: Pressure Procedural Pain: 0 Post Procedural Pain: 0 Response to Treatment: Procedure was tolerated well Level of Consciousness (Post- Awake and Alert procedure): Post Debridement Measurements of Total Wound Length: (cm) 0.9 Width: (cm) 1.5 Depth: (cm) 0.1 Volume: (cm) 0.106 Character of Wound/Ulcer Post Debridement: Improved Severity of Tissue Post Debridement: Necrosis of bone Post Procedure Diagnosis Same as Pre-procedure Electronic Signature(s) Signed: 07/29/2023 3:57:12 PM By: Duanne Guess MD FACS Signed: 07/29/2023 5:11:33 PM By: Zenaida Deed RN, BSN Entered By: Zenaida Deed on 07/29/2023 12:45:10 [name], [name] ([id number]) [id number] Page 3 of 16 -------------------------------------------------------------------------------- Debridement Details Patient Name: Date  of Service: [name], [name] 07/29/2023 2:45 PM Medical Record Number: [id number] Patient Account Number: [id number] Date of Birth/Sex: Treating RN: 1928/01/17 (89 y.o. [name] Primary Care Provider: [name] Other Clinician: Referring Provider: Treating Provider/Extender: Claudie Leach Weeks in Treatment: 20 Debridement Performed for Assessment: Wound #11 Right T Second oe Performed By: Physician Duanne Guess, MD The following information was scribed by: Zenaida Deed The information was scribed for: Duanne Guess Debridement Type: Debridement Level of Consciousness (Pre-procedure): Awake and Alert Pre-procedure Verification/Time Out Yes - 15:40 Taken: Start Time: 15:42 Pain Control: Lidocaine 4% T opical Solution Percent of Wound Bed Debrided: 100% T Area Debrided (cm): otal 0.25 Tissue and other material debrided: Viable, Non-Viable, Slough, Subcutaneous, Slough Level: Skin/Subcutaneous Tissue Debridement Description: Excisional Instrument:  Curette Bleeding: Minimum Hemostasis Achieved: Pressure Procedural Pain: 0 Post Procedural Pain: 0 Response to Treatment: Procedure was tolerated well Level of Consciousness (Post- Awake and Alert procedure): Post Debridement Measurements of Total Wound Length: (cm) 0.4 Stage: Category/Stage IV Width: (cm) 0.8 Depth: (cm) 0.1 Volume: (cm) 0.025 Character of Wound/Ulcer Post Debridement: Improved Post Procedure Diagnosis Same as Pre-procedure Electronic Signature(s) Signed: 07/29/2023 3:57:12 PM By: Duanne Guess MD FACS Signed: 07/29/2023 5:11:33 PM By: Zenaida Deed RN, BSN Entered By: Zenaida Deed on 07/29/2023 12:45:50 -------------------------------------------------------------------------------- Debridement Details Patient Name: Date of Service: [name]. 07/29/2023 2:45 PM Medical Record Number: [id number] Patient Account Number: [id number] Date of Birth/Sex: Treating RN: February 08, 1928 (89 y.o. [name] Primary Care Provider: [name] Other Clinician: Referring Provider: Treating Provider/Extender: Claudie Leach Weeks in Treatment: 20 Debridement Performed for Assessment: Wound #9 Left Calcaneus Performed By: Physician Duanne Guess, MD The following information was scribed by: Zenaida Deed The information was scribed for: Duanne Guess Debridement Type: Debridement Level of Consciousness (Pre-procedure): Awake and Alert [name], [name] ([id number]) [id number] Page 4 of 16 Pre-procedure Verification/Time Out Yes - 15:40 Taken: Start Time: 15:42 Pain Control: Lidocaine 4% T opical Solution Percent of Wound Bed Debrided: 100% T Area Debrided (cm): otal 0.09 Tissue and other material debrided: Viable, Non-Viable, Callus, Eschar, Slough, Subcutaneous, Slough Level: Skin/Subcutaneous Tissue Debridement Description: Excisional Instrument: Curette Bleeding: Minimum Hemostasis Achieved:  Pressure Procedural Pain: 0 Post Procedural Pain: 0 Response to Treatment: Procedure was tolerated well Level of Consciousness (Post- Awake and Alert procedure): Post Debridement Measurements of Total Wound Length: (cm) 0.4 Stage: Category/Stage III Width: (cm) 0.3 Depth: (cm) 0.3 Volume: (cm) 0.028 Character of Wound/Ulcer Post Debridement: Improved Post Procedure Diagnosis Same as Pre-procedure Electronic Signature(s) Signed: 07/29/2023 3:57:12 PM By: Duanne Guess MD FACS Signed: 07/29/2023 5:11:33 PM By: Zenaida Deed RN, BSN Entered By: Zenaida Deed on 07/29/2023 12:47:30 -------------------------------------------------------------------------------- HPI Details Patient Name: Date of Service: [name]. 07/29/2023 2:45 PM Medical Record Number: [id number] Patient Account Number: [id number] Date of Birth/Sex: Treating RN: 1928/06/22 (89 y.o. F) Primary Care Provider: [name] Other Clinician: Referring Provider: Treating Provider/Extender: Claudie Leach Weeks in Treatment: 20 History of Present Illness HPI Description: Admission 01/28/2022 Ms. 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 She states she has tried Medihoney and silver alginate in the past to this area with little benefit. She also has 2 small areas limited to  skin breakdown to her lower extremities bilaterally. She has chronic venous insufficiency but not has not been wearing her compression stockings. She states she bumped her legs against an object and not so the wound started. She has been using Medihoney to the sites. She denies signs of infection. 6/2; patient presents for follow-up. She had an  x-ray of her right foot done at last clinic visit and this was negative for evidence of osteomyelitis. She also had a wound culture done that showed extra high levels of Staph aureus. I recommended Keystone antibiotics for this and this was ordered. She had ABIs with TBI's done as well that showed monophasic waveforms to the right foot with TBI of 0 and an ABI of 0.52. Urgent referral was made to vein and vascular and she saw Dr. Durwin Nora on 6/1, yesterday and he recommended an arteriogram. This is scheduled for 6/16. Patient also reports a new wound to the right great toe. This is a blister that has ruptured. She also reports increased erythema to the toe. 6/6; the patient was worked in urgently today at the insistence of her daughter out of concern for a new wound on the lateral part of the plantar right great toe. She has her original postsurgical wound after the amputation of the right second toe, she has a wound on the medial part of the right great toe. The patient is apparently going for an angiogram by Dr. Durwin Nora in 2 weeks time. 6/13; patient presents for follow-up. She has been using bacitracin to the abrasion on the right great toe. She has been using collagen and Keystone antibiotic to the amputation site. She has no issues or complaints today. She denies signs of infection. 6/22; patient presents for follow-up. She states that her abdominal aortogram was canceled due to her renal function. She has been using Keystone antibiotics to the amputation site and Medihoney to the right great toe wound. At the pace of the right great toe she has a slitlike open area that she thinks was caused by the tape from the dressing. 6/29; patient presents for follow-up. She has been using Keystone antibiotics to the amputation site along with collagen. She has been using Medihoney to the [name], [name] ([id number]) [id number] Page 5 of 16 right great toe wound. She has no other  wounds. She denies signs of infection. 7/13; patient presents for follow-up. She has been using Keystone antibiotics and collagen to the wound sites. She currently denies signs of infection. She states she is scheduled to see me nephrology next month. 7/20; patient presents for follow-up. She continue Keystone antibiotics and collagen to the wound sites. She has no issues or complaints today. 8/1; patient presents for follow up. She continues to use keystone antibiotics and collagen to the wound sites. She has no issues or complaints today. 8/15; patient presents for follow-up. She has been using Keystone antibiotics and collagen to the wound sites. She followed up with her nephrologist who made medication changes. She is supposed to get a repeat BMP in 2 weeks. Her decrease in renal function was a limiting factor in obtaining an arteriogram for potential intervention for revascularization. She currently denies signs of infection. 9/2; patient presents for follow-up. She has been using Keystone antibiotic and collagen to the wound beds. She has no issues or complaints today. Reports there has been improvement in kidney function however not cleared to have her arteriogram just yet. 10/10; patient presents for follow-up. She has been using Keystone antibiotic and collagen  to the wound beds. She reports 2 new wounds 1 to the anterior right lower extremity and another to the plantar aspect of the right foot. She states that the right plantar foot wound was caused by the home health nurse changing the dressing and causing a skin tear. She is not sure how the right anterior leg wound started. It appears to be from trauma. She denies signs of infection. 10/19; patient presents for follow-up. She has been using Keystone antibiotic and collagen to the right great toe wound. She is been using silver alginate to the right anterior and right plantar foot wound. She has been using Tubigrip to the right lower  extremity. The plantar foot wound has healed. She has no issues or complaints today. 11/3; since the patient was last here she was seen in urgent care apparently for an area on the dorsal aspect of the right fifth toe perhaps over the PIP. I saw a picture of this on the daughter's cell phone. There was slough on this. Urgent care gave him doxycycline. She is also changed the dressing to all wounds back the [facility] and collagen which includes her right leg and left first toe 11/16; patient presents for follow-up. She has a new wound to the left knee. She states she fell. She has been using antibiotic ointment and collagen to this area. She has been using collagen and Keystone antibiotic to the right great toe wound. The anterior right leg wound is healed. She denies signs of infection. 11/30; patient presents for follow-up. The right great toe wound has healed. She has 1 remaining wound to the left knee. She has been using Hydrofera Blue and Medihoney here. 12/19; patient presents for follow-up. Her left knee wound has healed. She has no issues or complaints today. 09/30/2022 Patient's daughter called for an appointment due to increased swelling to her lower extremities bilaterally. Today patient presents with increased swelling to her lower extremities although there is no increased redness or warmth. She was evaluated by her nephrologist who increased her diuretics. Per patient and daughter her swelling has gone down to the leg Over the past several days. She does not have any open wounds. She was advised to elevate her legs and not consume excess salt By her PCP and nephrologist. Patient has compression stockings that she has been using sporadically. READMISSION 03/10/2023 Since her last visit to the clinic, the patient has been hospitalized at least twice, in February with COVID pneumonia and CHF exacerbation. She was discharged to a skilled nursing facility and then readmitted in April with  fevers. She was noted to have pressure ulcers on her heels and sacrum. Per family declined skilled nursing placement upon discharge and she has apparently been residing at home. She has followed with the vascular surgery clinic regarding peripheral artery disease and due to ulcerations on her feet, she ultimately underwent arteriography with balloon angioplasties of the superficial femoral artery, popliteal artery and peroneal artery on the left, along with shockwave ultrasound assisted balloon angioplasty of the popliteal artery and distal SFA. Given her multiple significant medical comorbidities, she is not a candidate for open surgery. She is deemed to be maximally vascularized this procedure, which was performed on 04 March 2023. T oday, she has a stage III pressure ulcer on her left heel and wounds on the PIP joints of her right third and left second toes. No sacral wound is present and the ulcer on her left heel has closed. 03/18/2023: She has a new ulcer  on her left great toe. It was just noticed yesterday and the etiology is unknown. The fat layer is exposed and there is some slough accumulation. The other wounds are all essentially unchanged in size. There is a little bit more granulation tissue on the other toe wounds, but bone is still frankly exposed on both sides. The heel ulcer has thick slough accumulation. 03/26/2023: She has a new wound on the tip of her left third toe. It is black. Neither the patient nor her daughter are aware of how it began. The other wounds are stable. 03/31/2023: The culture that I took from her left third toe returned with MRSA. We contacted the patient's daughter to have her stop levofloxacin and start doxycycline, but she has not yet picked up the new antibiotic. All of her wounds have accumulated slough. Bone remains exposed. She is scheduled to follow- up with vascular surgery tomorrow to evaluate the patency of her revascularization. 04/09/2023: She saw her  vascular surgeon and her ABIs are improved; her TBI's could not be checked due to her bandages. She did finally start taking the prescribed doxycycline. All of her wounds look about the same today. 04/23/2023: No significant changes to any of her wounds aside from being slightly smaller other than the left dorsal great toe wound, which is stable. She has accumulated slough and eschar on all of the surfaces. 04/30/2023: All of the wounds are stable with the exception of the left dorsal great toe wound which now has tendon exposed in addition to bone. There is slough and eschar accumulation on all sites. 05/15/2023: The patient was in the hospital and missed her appointment last week. The wound at the tip of her left third toe is healed. Everything else looks the same. 05/21/2023: No real change to any of the wounds. 05/28/2023: Once again, there has been no real change to any of her wounds except that the wound at the tip of her left great toe is closed. 06/11/2019: No change to any of her wounds. 06/25/2023: The wound on her right great toe nailbed has healed. The other wounds on her toes are unchanged and continue to have bone/joints exposed. The ulcer on her heel is smaller and shallower with very little necrotic tissue present. She fell last week and split her knee open. Apparently she presented to the ED greater than 12 hours after injury and they attempted to loosely suture the site. The sutures have pulled through her skin and they are doing absolutely nothing at this point. The wound is full of nonviable tissue. The muscle fascia is visible. Her PCP has her on doxycycline. 07/08/2023: The wounds on her feet are basically unchanged although the heel ulcer may be slightly smaller. The wound on her left knee looks much better this week. It is cleaner but still has a layer of slough on the surface. I can no longer see the muscle fascia. 07/22/2023: The heel ulcer is smaller and shallower. The left knee  wound has also closed up considerably with just a small superficial open area that remains. The wounds on her toes are unchanged with persistent bone exposure. [name], [name] ([id number]) [id number] Page 6 of 16 07/29/2023: The knee wound is smaller again today. There is a little bit of dry skin around the edges and thin slough on the surface. The other wounds are unchanged. Electronic Signature(s) Signed: 07/29/2023 3:51:24 PM By: Duanne Guess MD FACS Entered By: Duanne Guess on 07/29/2023 12:51:24 -------------------------------------------------------------------------------- Physical Exam Details Patient Name:  Date of Service: [name], [name] 07/29/2023 2:45 PM Medical Record Number: [id number] Patient Account Number: [id number] Date of Birth/Sex: Treating RN: 01/24/1928 (89 y.o. F) Primary Care Provider: [name] Other Clinician: Referring Provider: Treating Provider/Extender: Merceda Elks, Tereso Newcomer Weeks in Treatment: 20 Constitutional . . . . no acute distress. Respiratory Normal work of breathing on supplemental oxygen. Notes 07/29/2023: The knee wound is smaller again today. There is a little bit of dry skin around the edges and thin slough on the surface. The other wounds are unchanged. Electronic Signature(s) Signed: 07/29/2023 3:54:04 PM By: Duanne Guess MD FACS Entered By: Duanne Guess on 07/29/2023 12:54:04 -------------------------------------------------------------------------------- Physician Orders Details Patient Name: Date of Service: [name]. 07/29/2023 2:45 PM Medical Record Number: [id number] Patient Account Number: [id number] Date of Birth/Sex: Treating RN: 31-Dec-1927 (89 y.o. [name], [name] Primary Care Provider: [name] Other Clinician: Referring Provider: Treating Provider/Extender: Claudie Leach Weeks in Treatment: 20 The following information was scribed by:  Zenaida Deed The information was scribed for: Duanne Guess Verbal / Phone Orders: No Diagnosis Coding ICD-10 Coding Code Description [phone] Pressure ulcer of left heel, 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 without evidence of necrosis L97.825 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 involvement without evidence of necrosis I73.9 Peripheral vascular disease, unspecified I50.32 Chronic diastolic (congestive) heart failure N18.32 Chronic kidney disease, stage 3b I87.2 Venous insufficiency (chronic) (peripheral) [name], [name] ([id number]) [id number] Page 7 of 16 Follow-up Appointments Return appointment in 1 month. - Dr. Lady Gary Wed 12/18 @ 2:45 pm Anesthetic (In clinic) Topical Lidocaine 4% applied to wound bed Bathing/ Shower/ Hygiene May shower and wash wound with soap and water. Off-Loading Other: - float heels with pillows under calves Additional Orders / Instructions Follow Nutritious Diet - add in protein shakes every day to diet - recommend premier protein 500 mg x3 a day vitamin C, zinc 30-50 mg per day Home Health No change in wound care orders this week; continue Home Health for wound care. May utilize formulary equivalent dressing for wound treatment orders unless otherwise specified. Other Home Health Orders/Instructions: - Medi Home Wound Treatment Wound #10 - T Second oe Wound Laterality: Left Cleanser: Soap and Water 1 x Per Day/30 Days Discharge Instructions: May shower and wash wound with dial antibacterial soap and water prior to dressing change. Cleanser: Wound Cleanser 1 x Per Day/30 Days Discharge Instructions: Cleanse the wound with wound cleanser prior to applying a clean dressing using gauze sponges, not tissue or cotton  balls. Peri-Wound Care: Sween Lotion (Moisturizing lotion) 1 x Per Day/30 Days Discharge Instructions: Apply moisturizing lotion as directed Prim Dressing: Promogran Prisma Matrix, 4.34 (sq in) (silver collagen) (Dispense As Written) 1 x Per Day/30 Days ary Discharge Instructions: Moisten collagen with saline or hydrogel Secondary Dressing: Woven Gauze Sponge, Non-Sterile 4x4 in (Generic) 1 x Per Day/30 Days Discharge Instructions: Apply over primary dressing as directed. Secured With: [employer], 4.5x3.1 (in/yd) (Generic) 1 x Per Day/30 Days Discharge Instructions: Secure with Kerlix as directed. Secured With: 10M Medipore H Soft Cloth Surgical T ape, 4 x 10 (in/yd) (Generic) 1 x Per Day/30 Days Discharge Instructions: Secure with tape as directed. Wound #11 - T Second oe Wound Laterality: Right Cleanser: Soap and Water 1 x Per Day/30 Days Discharge Instructions: May shower  and wash wound with dial antibacterial soap and water prior to dressing change. Cleanser: Wound Cleanser 1 x Per Day/30 Days Discharge Instructions: Cleanse the wound with wound cleanser prior to applying a clean dressing using gauze sponges, not tissue or cotton balls. Prim Dressing: Promogran Prisma Matrix, 4.34 (sq in) (silver collagen) (Dispense As Written) 1 x Per Day/30 Days ary Discharge Instructions: Moisten collagen with saline or hydrogel Secondary Dressing: Woven Gauze Sponge, Non-Sterile 4x4 in (Generic) 1 x Per Day/30 Days Discharge Instructions: Apply over primary dressing as directed. Secured With: Insurance underwriter, Sterile 2x75 (in/in) (Generic) 1 x Per Day/30 Days Discharge Instructions: Secure with stretch gauze as directed. Secured With: 42M Medipore H Soft Cloth Surgical T ape, 4 x 10 (in/yd) (Generic) 1 x Per Day/30 Days Discharge Instructions: Secure with tape as directed. Wound #13 - T Great oe Wound Laterality: Dorsal, Left Cleanser: Soap and Water 1 x Per Day/30  Days Discharge Instructions: May shower and wash wound with dial antibacterial soap and water prior to dressing change. Cleanser: Wound Cleanser 1 x Per Day/30 Days Discharge Instructions: Cleanse the wound with wound cleanser prior to applying a clean dressing using gauze sponges, not tissue or cotton balls. Prim Dressing: Promogran Prisma Matrix, 4.34 (sq in) (silver collagen) (Dispense As Written) 1 x Per Day/30 Days ary Discharge Instructions: Moisten collagen with saline or hydrogel Secondary Dressing: Woven Gauze Sponge, Non-Sterile 4x4 in (Generic) 1 x Per Day/30 Days Discharge Instructions: Apply over primary dressing as directed. LEXXY, DEROY ([id number]) [id number] Page 8 of 16 Secured With: 42M Medipore H Soft Cloth Surgical T ape, 4 x 10 (in/yd) (Generic) 1 x Per Day/30 Days Discharge Instructions: Secure with tape as directed. Wound #16 - Knee Wound Laterality: Left Cleanser: Normal Saline (Generic) 3 x Per Week/30 Days Discharge Instructions: Cleanse the wound with Normal Saline prior to applying a clean dressing using gauze sponges, not tissue or cotton balls. Prim Dressing: Hydrofera Blue Ready Transfer Foam, 4x5 (in/in) 3 x Per Week/30 Days ary Discharge Instructions: Apply to wound bed as instructed Secondary Dressing: Zetuvit Plus Silicone Border Dressing 5x5 (in/in) 3 x Per Week/30 Days Discharge Instructions: Apply silicone border over primary dressing as directed. Wound #9 - Calcaneus Wound Laterality: Left Cleanser: Soap and Water 1 x Per Day/30 Days Discharge Instructions: May shower and wash wound with dial antibacterial soap and water prior to dressing change. Cleanser: Wound Cleanser 1 x Per Day/30 Days Discharge Instructions: Cleanse the wound with wound cleanser prior to applying a clean dressing using gauze sponges, not tissue or cotton balls. Prim Dressing: Promogran Prisma Matrix, 4.34 (sq in) (silver collagen) (Dispense As  Written) 1 x Per Day/30 Days ary Discharge Instructions: Moisten collagen with saline or hydrogel Secondary Dressing: ALLEVYN Heel 4 1/2in x 5 1/2in / 10.5cm x 13.5cm (Generic) 1 x Per Day/30 Days Discharge Instructions: Apply over primary dressing as directed. Secondary Dressing: Woven Gauze Sponge, Non-Sterile 4x4 in (Generic) 1 x Per Day/30 Days Discharge Instructions: Apply over primary dressing as directed. Secured With: [employer], 4.5x3.1 (in/yd) (Generic) 1 x Per Day/30 Days Discharge Instructions: Secure with Kerlix as directed. Secured With: 42M Medipore H Soft Cloth Surgical T ape, 4 x 10 (in/yd) (Generic) 1 x Per Day/30 Days Discharge Instructions: Secure with tape as directed. Electronic Signature(s) Signed: 07/29/2023 3:57:12 PM By: Duanne Guess MD FACS Entered By: Duanne Guess on 07/29/2023 12:54:33 -------------------------------------------------------------------------------- Problem List Details Patient Name: Date of Service: Matto, HA TTIE [name]. 07/29/2023 2:45 PM  Medical Record Number: [id number] Patient Account Number: [id number] Date of Birth/Sex: Treating RN: 12/19/1927 (89 y.o. 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 03/10/2023 No Yes without evidence of necrosis 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 03/10/2023 No Yes without evidence of necrosis [name], [name] ([id number]) [id number] Page 9 of 16 [phone] 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 06/25/2023 No Yes involvement without evidence of  necrosis I73.9 Peripheral vascular disease, unspecified 03/10/2023 No Yes I50.32 Chronic diastolic (congestive) heart failure 03/10/2023 No Yes N18.32 Chronic kidney disease, stage 3b 03/10/2023 No Yes I87.2 Venous insufficiency (chronic) (peripheral) 03/10/2023 No Yes Inactive Problems ICD-10 Code Description Active Date Inactive Date L97.522 Non-pressure chronic ulcer of other part of left foot with fat layer exposed 03/18/2023 03/18/2023 Resolved Problems Electronic Signature(s) Signed: 07/29/2023 3:50:06 PM By: Duanne Guess MD FACS Entered By: Duanne Guess on 07/29/2023 12:50:06 -------------------------------------------------------------------------------- Progress Note Details Patient Name: Date of Service: [name]. 07/29/2023 2:45 PM Medical Record Number: [id number] Patient Account Number: [id number] Date of Birth/Sex: Treating RN: 01-21-1928 (89 y.o. F) Primary Care Provider: [name] Other Clinician: Referring Provider: Treating Provider/Extender: Claudie Leach Weeks in Treatment: 20 Subjective Chief Complaint Information obtained from Patient 01/28/2022; right second toe amputation site dehiscence and bilateral lower extremity wounds. 03/10/2023: pressure ulcers of right heel, ulcers on toes History of Present Illness (HPI) Admission 01/28/2022 Ms. 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 She states she has tried Medihoney and silver alginate in the past to this area with little benefit. She also has 2 small areas limited to skin breakdown to her lower extremities bilaterally. She has chronic venous insufficiency but not has not been wearing her compression stockings.  She states she bumped her legs against an object and not so the wound started. She has been using Medihoney to the sites. She denies signs of infection. 6/2; patient presents for follow-up. She had an x-ray of her right foot done at last clinic visit and this was negative for evidence of osteomyelitis. She also had a wound culture done that showed extra high levels of Staph aureus. I recommended Keystone antibiotics for this and this was ordered. She had ABIs with TBI's done as well that showed monophasic waveforms to the right foot with TBI of 0 and an ABI of 0.52. Urgent referral was made to vein and vascular and she saw Dr. Durwin Nora on 6/1, yesterday and he recommended an arteriogram. This is scheduled for 6/16. Patient also reports a new wound to the right great toe. This is a blister that has ruptured. She also reports increased erythema to the toe. 6/6; the patient was worked in urgently today at the insistence of her daughter out of concern for a new wound on the lateral part of the plantar right great toe. She has her original postsurgical wound after the amputation of the right second toe, she  has a wound on the medial part of the right great toe. CELECIA, HOHLT ([id number]) [id number] Page 10 of 16 The patient is apparently going for an angiogram by Dr. Durwin Nora in 2 weeks time. 6/13; patient presents for follow-up. She has been using bacitracin to the abrasion on the right great toe. She has been using collagen and Keystone antibiotic to the amputation site. She has no issues or complaints today. She denies signs of infection. 6/22; patient presents for follow-up. She states that her abdominal aortogram was canceled due to her renal function. She has been using Keystone antibiotics to the amputation site and Medihoney to the right great toe wound. At the pace of the right great toe she has a slitlike open area that she thinks was caused by the tape from the  dressing. 6/29; patient presents for follow-up. She has been using Keystone antibiotics to the amputation site along with collagen. She has been using Medihoney to the right great toe wound. She has no other wounds. She denies signs of infection. 7/13; patient presents for follow-up. She has been using Keystone antibiotics and collagen to the wound sites. She currently denies signs of infection. She states she is scheduled to see me nephrology next month. 7/20; patient presents for follow-up. She continue Keystone antibiotics and collagen to the wound sites. She has no issues or complaints today. 8/1; patient presents for follow up. She continues to use keystone antibiotics and collagen to the wound sites. She has no issues or complaints today. 8/15; patient presents for follow-up. She has been using Keystone antibiotics and collagen to the wound sites. She followed up with her nephrologist who made medication changes. She is supposed to get a repeat BMP in 2 weeks. Her decrease in renal function was a limiting factor in obtaining an arteriogram for potential intervention for revascularization. She currently denies signs of infection. 9/2; patient presents for follow-up. She has been using Keystone antibiotic and collagen to the wound beds. She has no issues or complaints today. Reports there has been improvement in kidney function however not cleared to have her arteriogram just yet. 10/10; patient presents for follow-up. She has been using Keystone antibiotic and collagen to the wound beds. She reports 2 new wounds 1 to the anterior right lower extremity and another to the plantar aspect of the right foot. She states that the right plantar foot wound was caused by the home health nurse changing the dressing and causing a skin tear. She is not sure how the right anterior leg wound started. It appears to be from trauma. She denies signs of infection. 10/19; patient presents for follow-up. She has been  using Keystone antibiotic and collagen to the right great toe wound. She is been using silver alginate to the right anterior and right plantar foot wound. She has been using Tubigrip to the right lower extremity. The plantar foot wound has healed. She has no issues or complaints today. 11/3; since the patient was last here she was seen in urgent care apparently for an area on the dorsal aspect of the right fifth toe perhaps over the PIP. I saw a picture of this on the daughter's cell phone. There was slough on this. Urgent care gave him doxycycline. She is also changed the dressing to all wounds back the [facility] and collagen which includes her right leg and left first toe 11/16; patient presents for follow-up. She has a new wound to the left knee. She states she fell. She has been  using antibiotic ointment and collagen to this area. She has been using collagen and Keystone antibiotic to the right great toe wound. The anterior right leg wound is healed. She denies signs of infection. 11/30; patient presents for follow-up. The right great toe wound has healed. She has 1 remaining wound to the left knee. She has been using Hydrofera Blue and Medihoney here. 12/19; patient presents for follow-up. Her left knee wound has healed. She has no issues or complaints today. 09/30/2022 Patient's daughter called for an appointment due to increased swelling to her lower extremities bilaterally. Today patient presents with increased swelling to her lower extremities although there is no increased redness or warmth. She was evaluated by her nephrologist who increased her diuretics. Per patient and daughter her swelling has gone down to the leg Over the past several days. She does not have any open wounds. She was advised to elevate her legs and not consume excess salt By her PCP and nephrologist. Patient has compression stockings that she has been using sporadically. READMISSION 03/10/2023 Since her last visit to the  clinic, the patient has been hospitalized at least twice, in February with COVID pneumonia and CHF exacerbation. She was discharged to a skilled nursing facility and then readmitted in April with fevers. She was noted to have pressure ulcers on her heels and sacrum. Per family declined skilled nursing placement upon discharge and she has apparently been residing at home. She has followed with the vascular surgery clinic regarding peripheral artery disease and due to ulcerations on her feet, she ultimately underwent arteriography with balloon angioplasties of the superficial femoral artery, popliteal artery and peroneal artery on the left, along with shockwave ultrasound assisted balloon angioplasty of the popliteal artery and distal SFA. Given her multiple significant medical comorbidities, she is not a candidate for open surgery. She is deemed to be maximally vascularized this procedure, which was performed on 04 March 2023. T oday, she has a stage III pressure ulcer on her left heel and wounds on the PIP joints of her right third and left second toes. No sacral wound is present and the ulcer on her left heel has closed. 03/18/2023: She has a new ulcer on her left great toe. It was just noticed yesterday and the etiology is unknown. The fat layer is exposed and there is some slough accumulation. The other wounds are all essentially unchanged in size. There is a little bit more granulation tissue on the other toe wounds, but bone is still frankly exposed on both sides. The heel ulcer has thick slough accumulation. 03/26/2023: She has a new wound on the tip of her left third toe. It is black. Neither the patient nor her daughter are aware of how it began. The other wounds are stable. 03/31/2023: The culture that I took from her left third toe returned with MRSA. We contacted the patient's daughter to have her stop levofloxacin and start doxycycline, but she has not yet picked up the new antibiotic. All of  her wounds have accumulated slough. Bone remains exposed. She is scheduled to follow- up with vascular surgery tomorrow to evaluate the patency of her revascularization. 04/09/2023: She saw her vascular surgeon and her ABIs are improved; her TBI's could not be checked due to her bandages. She did finally start taking the prescribed doxycycline. All of her wounds look about the same today. 04/23/2023: No significant changes to any of her wounds aside from being slightly smaller other than the left dorsal great toe wound, which  is stable. She has accumulated slough and eschar on all of the surfaces. 04/30/2023: All of the wounds are stable with the exception of the left dorsal great toe wound which now has tendon exposed in addition to bone. There is slough and eschar accumulation on all sites. 05/15/2023: The patient was in the hospital and missed her appointment last week. The wound at the tip of her left third toe is healed. Everything else looks the same. 05/21/2023: No real change to any of the wounds. 05/28/2023: Once again, there has been no real change to any of her wounds except that the wound at the tip of her left great toe is closed. 06/11/2019: No change to any of her wounds. RENNETTE, [name] ([id number]) [id number] Page 11 of 16 06/25/2023: The wound on her right great toe nailbed has healed. The other wounds on her toes are unchanged and continue to have bone/joints exposed. The ulcer on her heel is smaller and shallower with very little necrotic tissue present. She fell last week and split her knee open. Apparently she presented to the ED greater than 12 hours after injury and they attempted to loosely suture the site. The sutures have pulled through her skin and they are doing absolutely nothing at this point. The wound is full of nonviable tissue. The muscle fascia is visible. Her PCP has her on doxycycline. 07/08/2023: The wounds on her feet are basically unchanged  although the heel ulcer may be slightly smaller. The wound on her left knee looks much better this week. It is cleaner but still has a layer of slough on the surface. I can no longer see the muscle fascia. 07/22/2023: The heel ulcer is smaller and shallower. The left knee wound has also closed up considerably with just a small superficial open area that remains. The wounds on her toes are unchanged with persistent bone exposure. 07/29/2023: The knee wound is smaller again today. There is a little bit of dry skin around the edges and thin slough on the surface. The other wounds are unchanged. Patient History Information obtained from Patient, Caregiver, Chart. Family History Cancer - Siblings, Heart Disease, Stroke - Mother, No family history of Diabetes. Social History Former smoker - quit 2003, Marital Status - Widowed, Alcohol Use - Never, Drug Use - No History, Caffeine Use - Never. Medical History Eyes Patient has history of Cataracts Hematologic/Lymphatic Patient has history of Anemia Respiratory Denies history of Chronic Obstructive Pulmonary Disease (COPD) Cardiovascular Patient has history of Arrhythmia - a-fib, Congestive Heart Failure, Hypotension, Peripheral Venous Disease Musculoskeletal Patient has history of 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 Neurologic Patient has history of Neuropathy Hospitalization/Surgery History - Abdominal aortogram w/lower extremity. - Amputation toe (Right). - Colonoscopy. - Elbow surgery. - Laparoscopic hysterectomy. Medical A Surgical History Notes nd Hematologic/Lymphatic Thrombocytopenia, Anticoagulant long-term use Respiratory Pulmonary embolus Gastrointestinal Hiatal hernia Endocrine Hyperthyroidism, Hypothyroidism Genitourinary CKD stage III Musculoskeletal arthritis Objective Constitutional no acute distress. Vitals Time Taken: 3:14 AM, Height: 64 in, Weight: 157 lbs, BMI: 26.9, Temperature: 98.1 F, Pulse: 72  bpm, Respiratory Rate: 18 breaths/min, Blood Pressure: 104/51 mmHg. Respiratory Normal work of breathing on supplemental oxygen. General Notes: 07/29/2023: The knee wound is smaller again today. There is a little bit of dry skin around the edges and thin slough on the surface. The other wounds are unchanged. Integumentary (Hair, Skin) Wound #10 status is Open. Original cause of wound was Pressure Injury. The date acquired was: 10/09/2022. The wound has been in treatment 20  weeks. The wound is located on the Left T Second. The wound measures 0.3cm length x 0.6cm width x 0.2cm depth; 0.141cm^2 area and 0.028cm^3 volume. There is oe bone, joint, and Fat Layer (Subcutaneous Tissue) exposed. There is no tunneling or undermining noted. There is a medium amount of serous drainage noted. The wound margin is distinct with the outline attached to the wound base. There is medium (34-66%) red, pink granulation within the wound bed. There is a small (1-33%) amount of necrotic tissue within the wound bed including Adherent Slough. The periwound skin appearance had no abnormalities noted for texture. The periwound skin appearance had no abnormalities noted for moisture. The periwound skin appearance had no abnormalities noted for color. Periwound temperature was noted as No Abnormality. The periwound has tenderness on palpation. [name], [name] ([id number]) [id number] Page 12 of 16 Wound #11 status is Open. Original cause of wound was Pressure Injury. The date acquired was: 10/09/2022. The wound has been in treatment 20 weeks. The wound is located on the Right T Second. The wound measures 0.4cm length x 0.8cm width x 0.1cm depth; 0.251cm^2 area and 0.025cm^3 volume. There is oe bone, joint, and Fat Layer (Subcutaneous Tissue) exposed. There is no tunneling or undermining noted. There is a medium amount of serous drainage noted. The wound margin is distinct with the outline attached to the  wound base. There is medium (34-66%) pink granulation within the wound bed. There is a small (1- 33%) amount of necrotic tissue within the wound bed including Adherent Slough. The periwound skin appearance had no abnormalities noted for texture. The periwound skin appearance had no abnormalities noted for moisture. The periwound skin appearance had no abnormalities noted for color. Periwound temperature was noted as No Abnormality. The periwound has tenderness on palpation. Wound #13 status is Open. Original cause of wound was Gradually Appeared. The date acquired was: 03/24/2023. The wound has been in treatment 17 weeks. The wound is located on the Left,Dorsal T Great. The wound measures 0.9cm length x 1.5cm width x 0.1cm depth; 1.06cm^2 area and 0.106cm^3 volume. oe There is bone and Fat Layer (Subcutaneous Tissue) exposed. There is no tunneling or undermining noted. There is a medium amount of serous drainage noted. The wound margin is distinct with the outline attached to the wound base. There is medium (34-66%) red granulation within the wound bed. There is a small (1- 33%) amount of necrotic tissue within the wound bed including Adherent Slough and Necrosis of Bone. The periwound skin appearance had no abnormalities noted for texture. The periwound skin appearance had no abnormalities noted for moisture. The periwound skin appearance had no abnormalities noted for color. Periwound temperature was noted as No Abnormality. The periwound has tenderness on palpation. Wound #16 status is Open. Original cause of wound was Trauma. The date acquired was: 06/18/2023. The wound has been in treatment 4 weeks. The wound is located on the Left Knee. The wound measures 0.7cm length x 1.4cm width x 0.1cm depth; 0.77cm^2 area and 0.077cm^3 volume. There is Fat Layer (Subcutaneous Tissue) exposed. There is no tunneling or undermining noted. There is a medium amount of serosanguineous drainage noted. The wound  margin is distinct with the outline attached to the wound base. There is large (67-100%) red granulation within the wound bed. There is no necrotic tissue within the wound bed. The periwound skin appearance had no abnormalities noted for texture. The periwound skin appearance had no abnormalities noted for moisture. The periwound skin appearance had no abnormalities  noted for color. Periwound temperature was noted as No Abnormality. The periwound has tenderness on palpation. Wound #9 status is Open. Original cause of wound was Pressure Injury. The date acquired was: 10/22/2022. The wound has been in treatment 20 weeks. The wound is located on the Left Calcaneus. The wound measures 0.4cm length x 0.2cm width x 0.3cm depth; 0.063cm^2 area and 0.019cm^3 volume. There is Fat Layer (Subcutaneous Tissue) exposed. There is no tunneling or undermining noted. There is a small amount of serous drainage noted. The wound margin is distinct with the outline attached to the wound base. There is small (1-33%) pink, pale granulation within the wound bed. There is a large (67-100%) amount of necrotic tissue within the wound bed including Adherent Slough. The periwound skin appearance had no abnormalities noted for moisture. The periwound skin appearance had no abnormalities noted for color. The periwound skin appearance exhibited: Callus. Periwound temperature was noted as No Abnormality. The periwound has tenderness on palpation. 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 There was a Excisional Skin/Subcutaneous Tissue Debridement oe with a total area of 0.25 sq cm performed by Duanne Guess, MD. With the following instrument(s): Curette to remove Viable and Non-Viable tissue/material. Material removed includes Subcutaneous Tissue and Slough and after achieving pain control using Lidocaine 4% T opical Solution. No specimens were taken. A time out was conducted at 15:40, prior to the start of the procedure. A Minimum amount of bleeding was controlled with Pressure. The procedure was tolerated well with a pain level of 0 throughout and a pain level of 0 following the procedure. Post Debridement Measurements: 0.4cm length x 0.8cm width x 0.1cm depth; 0.025cm^3 volume. Post debridement Stage noted as Category/Stage IV. Character of Wound/Ulcer Post Debridement is improved. 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 There was a Excisional Skin/Subcutaneous Tissue Debridement with a total area of 1.06 sq cm performed by Duanne Guess, MD. With the following instrument(s): Curette to remove Viable and Non-Viable tissue/material. Material removed includes Subcutaneous Tissue and Slough and after achieving pain control using Lidocaine 4% T opical Solution. No specimens were taken. A time out was conducted at 15:40, prior to the start of the procedure. A Minimum amount of bleeding was controlled with Pressure. The procedure was tolerated well with a pain level of 0 throughout and a pain level of 0 following the procedure. Post Debridement Measurements: 0.9cm length x 1.5cm width x 0.1cm depth; 0.106cm^3  volume. Character of Wound/Ulcer Post Debridement is improved. Severity of Tissue Post Debridement is: Necrosis of bone. Post procedure Diagnosis Wound #13: Same as Pre-Procedure Wound #16 Pre-procedure diagnosis of Wound #16 is an Abrasion located on the Left Knee . There was a Excisional Skin/Subcutaneous Tissue Debridement with a total area of 0.77 sq cm performed by Duanne Guess, MD. With the following  instrument(s): Curette to remove Viable and Non-Viable tissue/material. Material removed includes Subcutaneous Tissue and Slough and after achieving pain control using Lidocaine 4% T opical Solution. No specimens were taken. A time out was conducted at 15:40, prior to the start of the procedure. A Minimum amount of bleeding was controlled with Pressure. The procedure was tolerated well with a pain level of 0 throughout and a pain level of 0 following the procedure. Post Debridement Measurements: 0.7cm length x 1.4cm width x 0.1cm depth; 0.077cm^3 volume. Character of Wound/Ulcer Post Debridement is improved. Post procedure Diagnosis Wound #16: Same as Pre-Procedure [name], [name] ([id number]) [id number] Page 13 of 16 Wound #9 Pre-procedure diagnosis of Wound #9 is a Pressure Ulcer located on the Left Calcaneus . There was a Excisional Skin/Subcutaneous Tissue Debridement with a total area of 0.09 sq cm performed by Duanne Guess, MD. With the following instrument(s): Curette to remove Viable and Non-Viable tissue/material. Material removed includes Eschar, Callus, Subcutaneous Tissue, and Slough after achieving pain control using Lidocaine 4% T opical Solution. No specimens were taken. A time out was conducted at 15:40, prior to the start of the procedure. A Minimum amount of bleeding was controlled with Pressure. The procedure was tolerated well with a pain level of 0 throughout and a pain level of 0 following the procedure. Post Debridement Measurements: 0.4cm  length x 0.3cm width x 0.3cm depth; 0.028cm^3 volume. Post debridement Stage noted as Category/Stage III. Character of Wound/Ulcer Post Debridement is improved. Post procedure Diagnosis Wound #9: Same as Pre-Procedure Plan Follow-up Appointments: Return appointment in 1 month. - Dr. Lady Gary Wed 12/18 @ 2:45 pm Anesthetic: (In clinic) Topical Lidocaine 4% applied to wound bed Bathing/ Shower/ Hygiene: May shower and wash wound with soap and water. Off-Loading: Other: - float heels with pillows under calves Additional Orders / Instructions: Follow Nutritious Diet - add in protein shakes every day to diet - recommend premier protein 500 mg x3 a day vitamin C, zinc 30-50 mg per day Home Health: No change in wound care orders this week; continue Home Health for wound care. May utilize formulary equivalent dressing for wound treatment orders unless otherwise specified. Other Home Health Orders/Instructions: - Medi Home WOUND #10: - T Second Wound Laterality: Left oe Cleanser: Soap and Water 1 x Per Day/30 Days Discharge Instructions: May shower and wash wound with dial antibacterial soap and water prior to dressing change. Cleanser: Wound Cleanser 1 x Per Day/30 Days Discharge Instructions: Cleanse the wound with wound cleanser prior to applying a clean dressing using gauze sponges, not tissue or cotton balls. Peri-Wound Care: Sween Lotion (Moisturizing lotion) 1 x Per Day/30 Days Discharge Instructions: Apply moisturizing lotion as directed Prim Dressing: Promogran Prisma Matrix, 4.34 (sq in) (silver collagen) (Dispense As Written) 1 x Per Day/30 Days ary Discharge Instructions: Moisten collagen with saline or hydrogel Secondary Dressing: Woven Gauze Sponge, Non-Sterile 4x4 in (Generic) 1 x Per Day/30 Days Discharge Instructions: Apply over primary dressing as directed. Secured With: [employer], 4.5x3.1 (in/yd) (Generic) 1 x Per Day/30 Days Discharge Instructions: Secure with  Kerlix as directed. Secured With: 73M Medipore H Soft Cloth Surgical T ape, 4 x 10 (in/yd) (Generic) 1 x Per Day/30 Days Discharge Instructions: Secure with tape as directed. WOUND #11: - T Second Wound Laterality: Right oe Cleanser: Soap and Water 1 x Per Day/30 Days Discharge Instructions: May shower and wash wound with dial antibacterial soap and water prior to dressing change. Cleanser: Wound Cleanser 1 x Per Day/30 Days Discharge  Instructions: Cleanse the wound with wound cleanser prior to applying a clean dressing using gauze sponges, not tissue or cotton balls. Prim Dressing: Promogran Prisma Matrix, 4.34 (sq in) (silver collagen) (Dispense As Written) 1 x Per Day/30 Days ary Discharge Instructions: Moisten collagen with saline or hydrogel Secondary Dressing: Woven Gauze Sponge, Non-Sterile 4x4 in (Generic) 1 x Per Day/30 Days Discharge Instructions: Apply over primary dressing as directed. Secured With: Insurance underwriter, Sterile 2x75 (in/in) (Generic) 1 x Per Day/30 Days Discharge Instructions: Secure with stretch gauze as directed. Secured With: 59M Medipore H Soft Cloth Surgical T ape, 4 x 10 (in/yd) (Generic) 1 x Per Day/30 Days Discharge Instructions: Secure with tape as directed. WOUND #13: - T Great Wound Laterality: Dorsal, Left oe Cleanser: Soap and Water 1 x Per Day/30 Days Discharge Instructions: May shower and wash wound with dial antibacterial soap and water prior to dressing change. Cleanser: Wound Cleanser 1 x Per Day/30 Days Discharge Instructions: Cleanse the wound with wound cleanser prior to applying a clean dressing using gauze sponges, not tissue or cotton balls. Prim Dressing: Promogran Prisma Matrix, 4.34 (sq in) (silver collagen) (Dispense As Written) 1 x Per Day/30 Days ary Discharge Instructions: Moisten collagen with saline or hydrogel Secondary Dressing: Woven Gauze Sponge, Non-Sterile 4x4 in (Generic) 1 x Per Day/30 Days Discharge  Instructions: Apply over primary dressing as directed. Secured With: 59M Medipore H Soft Cloth Surgical T ape, 4 x 10 (in/yd) (Generic) 1 x Per Day/30 Days Discharge Instructions: Secure with tape as directed. WOUND #16: - Knee Wound Laterality: Left Cleanser: Normal Saline (Generic) 3 x Per Week/30 Days Discharge Instructions: Cleanse the wound with Normal Saline prior to applying a clean dressing using gauze sponges, not tissue or cotton balls. Prim Dressing: Hydrofera Blue Ready Transfer Foam, 4x5 (in/in) 3 x Per Week/30 Days ary Discharge Instructions: Apply to wound bed as instructed Secondary Dressing: Zetuvit Plus Silicone Border Dressing 5x5 (in/in) 3 x Per Week/30 Days Discharge Instructions: Apply silicone border over primary dressing as directed. WOUND #9: - Calcaneus Wound Laterality: Left Cleanser: Soap and Water 1 x Per Day/30 Days Discharge Instructions: May shower and wash wound with dial antibacterial soap and water prior to dressing change. Cleanser: Wound Cleanser 1 x Per Day/30 Days Discharge Instructions: Cleanse the wound with wound cleanser prior to applying a clean dressing using gauze sponges, not tissue or cotton balls. Prim Dressing: Promogran Prisma Matrix, 4.34 (sq in) (silver collagen) (Dispense As Written) 1 x Per Day/30 Days ary Discharge Instructions: Moisten collagen with saline or hydrogel Secondary Dressing: ALLEVYN Heel 4 1/2in x 5 1/2in / 10.5cm x 13.5cm (Generic) 1 x Per Day/30 Days Discharge Instructions: Apply over primary dressing as directed. Secondary Dressing: Woven Gauze Sponge, Non-Sterile 4x4 in (Generic) 1 x Per Day/30 Days Discharge Instructions: Apply over primary dressing as directed. Secured With: [employer], 4.5x3.1 (in/yd) (Generic) 1 x Per Day/30 Days Discharge Instructions: Secure with Kerlix as directed. Secured With: 59M Medipore H Soft Cloth Surgical T ape, 4 x 10 (in/yd) (Generic) 1 x Per Day/30 Days [name], Baylee [name]  ([id number]) [id number] Page 14 of 16 Discharge Instructions: Secure with tape as directed. 07/29/2023: The knee wound is smaller again today. There is a little bit of dry skin around the edges and thin slough on the surface. The other wounds are unchanged. I used a curette to debride slough and subcutaneous tissue from the knee, the left great toe and right second toe, as well as from the  heel. The left second toe is just bare exposed bone with nothing to debride. We will continue Hydrofera Blue to the knee and Prisma silver collagen everywhere else. Due to clinic scheduling and provider availability, follow-up in 1 month. Electronic Signature(s) Signed: 07/29/2023 3:55:56 PM By: Duanne Guess MD FACS Entered By: Duanne Guess on 07/29/2023 12:55:56 -------------------------------------------------------------------------------- HxROS Details Patient Name: Date of Service: [name]. 07/29/2023 2:45 PM Medical Record Number: [id number] Patient Account Number: [id number] Date of Birth/Sex: Treating RN: 1928-03-31 (89 y.o. F) Primary Care Provider: [name] Other Clinician: Referring Provider: Treating Provider/Extender: Tiajuana Amass in Treatment: 20 Information Obtained From Patient Caregiver Chart Eyes Medical History: Positive for: Cataracts Hematologic/Lymphatic Medical History: Positive for: Anemia Past Medical History Notes: Thrombocytopenia, Anticoagulant long-term use Respiratory Medical History: Negative for: Chronic Obstructive Pulmonary Disease (COPD) Past Medical History Notes: Pulmonary embolus Cardiovascular Medical History: Positive for: Arrhythmia - a-fib; Congestive Heart Failure; Hypotension; Peripheral Venous Disease Gastrointestinal Medical History: Past Medical History Notes: Hiatal hernia Endocrine Medical History: Past Medical History Notes: Hyperthyroidism,  Hypothyroidism Genitourinary Medical History: Past Medical History Notes: CKD stage III Vazques, Avril [name] ([id number]) [id number] Page 15 of 16 Musculoskeletal Medical History: Positive for: 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 Past Medical History Notes: arthritis Neurologic Medical History: Positive for: Neuropathy HBO Extended History Items Eyes: Cataracts Immunizations Pneumococcal Vaccine: Received Pneumococcal Vaccination: No Implantable Devices None Hospitalization / Surgery History Type of Hospitalization/Surgery Abdominal aortogram w/lower extremity Amputation toe (Right) Colonoscopy Elbow surgery Laparoscopic hysterectomy Family and Social History Cancer: Yes - Siblings; Diabetes: No; Heart Disease: Yes; Stroke: Yes - Mother; Former smoker - quit 2003; Marital Status - Widowed; Alcohol Use: Never; Drug Use: No History; Caffeine Use: Never; Financial Concerns: No; Food, Clothing or Shelter Needs: No; Support System Lacking: No; Transportation Concerns: No Electronic Signature(s) Signed: 07/29/2023 3:57:12 PM By: Duanne Guess MD FACS Entered By: Duanne Guess on 07/29/2023 12:51:33 -------------------------------------------------------------------------------- SuperBill Details Patient Name: Date of Service: [name]. 07/29/2023 Medical Record Number: [id number] Patient Account Number: [id number] Date of Birth/Sex: Treating RN: [birth year] (89 y.o. 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 without evidence of  necrosis L97.825 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 involvement without evidence of necrosis I73.9 Peripheral vascular disease, unspecified I50.32 Chronic diastolic (congestive) heart failure N18.32 Chronic kidney disease, stage 3b I87.2 Venous insufficiency (chronic) (peripheral) Martos, Romayne [name] ([id number]) [id number] Page 16 of 16 Facility Procedures : CPT4 Code: [id number] Description: 11042 - DEB SUBQ TISSUE 20 SQ CM/< ICD-10 Diagnosis Description L89.623 Pressure ulcer of left heel, 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 wi L97.526 Non-pressure chronic ulcer of other  part of left foot with bone involvement wit L97.825 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 involvem Modifier: thout evidence of nec hout evidence of necr ent without evidence Quantity: 1 rosis osis of necrosis Physician Procedures : CPT4 Code Description Modifier [id number] 99213 - WC PHYS LEVEL 3 - EST PT ICD-10 Diagnosis Description L89.623 Pressure ulcer of left heel, stage 3 L97.516 Non-pressure chronic ulcer of other part  of right foot with bone involvement without evidence of  necr 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 without evidence of necro L97.825 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 involvement without evidence o Quantity: 1 osis sis f necrosis : [id number] 11042 - WC PHYS SUBQ TISS 20 SQ CM ICD-10 Diagnosis Description L89.623 Pressure ulcer of left heel, 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

## 2023-07-30 NOTE — Progress Notes (Signed)
[name] ([id number]) [id number] Page 1 of 14 Visit Report for 07/29/2023 Arrival Information Details Patient Name: Date of Service: [name], [name] 07/29/2023 2:45 PM Medical Record Number: [id number] Patient Account Number: [id number] Date of Birth/Sex: Treating RN: [birth year] (89 y.o. F) Primary Care Emmani Lesueur: [name] Other Clinician: Referring Desten Manor: Treating Ladainian Therien/Extender: Tiajuana Amass in Treatment: 20 Visit Information History Since Last Visit Added or deleted any medications: No Patient Arrived: Wheel Chair Any new allergies or adverse reactions: No Arrival Time: 15:14 Had a fall or experienced change in No Accompanied By: daughter activities of daily living that may affect Transfer Assistance: None risk of falls: Patient Identification Verified: Yes Signs or symptoms of abuse/neglect since last visito No Secondary Verification Process Completed: Yes Hospitalized since last visit: No Patient Requires Transmission-Based Precautions: No Implantable device outside of the clinic excluding No Patient Has Alerts: Yes cellular tissue based products placed in the center Patient Alerts: ABI R: 0.48 L: 0.46 2/24 since last visit: TBI R: 0.39 L: 0.21 2/24 Has Dressing in Place as Prescribed: Yes Pain Present Now: No Electronic Signature(s) Signed: 07/29/2023 5:11:33 PM By: Zenaida Deed RN, BSN Entered By: Zenaida Deed on 07/29/2023 12:20:09 -------------------------------------------------------------------------------- Encounter Discharge Information Details Patient Name: Date of Service: Megan Reap. 07/29/2023 2:45 PM Medical Record Number: [id number] Patient Account Number: [id number] Date of Birth/Sex: Treating RN: 1928-06-12 (89 y.o. [name] Primary Care Deina Lipsey: [name] Other Clinician: Referring Luman Holway: Treating Aleka Twitty/Extender: Claudie Leach Weeks in  Treatment: 20 Encounter Discharge Information Items Post Procedure Vitals Discharge Condition: Stable Temperature (F): 98.1 Ambulatory Status: Wheelchair Pulse (bpm): 72 Discharge Destination: Home Respiratory Rate (breaths/min): 18 Transportation: Private Auto Blood Pressure (mmHg): 104/51 Accompanied By: daUGHTER Schedule Follow-up Appointment: Yes Clinical Summary of Care: Patient Declined Electronic Signature(s) Signed: 07/29/2023 5:11:33 PM By: Zenaida Deed RN, BSN Entered By: Zenaida Deed on 07/29/2023 13:10:31 Pascual, [name] ([id number]) [id number] Page 2 of 14 -------------------------------------------------------------------------------- Lower Extremity Assessment Details Patient Name: Date of Service: [name] 07/29/2023 2:45 PM Medical Record Number: [id number] Patient Account Number: [id number] Date of Birth/Sex: Treating RN: 1927/09/21 (89 y.o. [name] Primary Care Shanice Poznanski: [name] Other Clinician: Referring Khady Vandenberg: Treating Kendria Halberg/Extender: Claudie Leach Weeks in Treatment: 20 Edema Assessment Assessed: [Left: No] [Right: No] Edema: [Left: Yes] [Right: Yes] Calf Left: Right: Point of Measurement: From Medial Instep 39 cm 36.5 cm Ankle Left: Right: Point of Measurement: From Medial Instep 24.5 cm 24 cm Vascular Assessment Pulses: Dorsalis Pedis Palpable: [Left:Yes] [Right:No] Extremity colors, hair growth, and conditions: Extremity Color: [Left:Hyperpigmented] [Right:Hyperpigmented] Hair Growth on Extremity: [Left:No] [Right:No] Temperature of Extremity: [Left:Warm < 3 seconds] [Right:Warm < 3 seconds] Electronic Signature(s) Signed: 07/29/2023 5:11:33 PM By: Zenaida Deed RN, BSN Entered By: Zenaida Deed on 07/29/2023 12:20:56 -------------------------------------------------------------------------------- Multi Wound Chart Details Patient Name: Date of Service: Megan Reap. 07/29/2023 2:45 PM Medical Record Number: [id number] Patient Account Number: [id number] Date of Birth/Sex: Treating RN: 26-Oct-1927 (89 y.o. F) Primary Care Johnnie Goynes: [name] Other Clinician: Referring Shaiann Mcmanamon: Treating Jafari Mckillop/Extender: Claudie Leach Weeks in Treatment: 20 Vital Signs Height(in): 64 Pulse(bpm): 72 Weight(lbs): 157 Blood Pressure(mmHg): 104/51 Body Mass Index(BMI): 26.9 Temperature(F): 98.1 Respiratory Rate(breaths/min): 18 [10:Photos:] [id number] Page 3 of 14] Left T Second oe Right T Second oe Left, Dorsal T Great oe Wound Location: Pressure Injury Pressure Injury Gradually Appeared Wounding Event: Pressure Ulcer Pressure Ulcer Arterial Insufficiency Ulcer Primary Etiology: Cataracts, Anemia, Arrhythmia, Cataracts, Anemia,  Arrhythmia, Cataracts, Anemia, Arrhythmia, Comorbid History: Congestive Heart Failure, Congestive Heart Failure, Congestive Heart Failure, Hypotension, Peripheral Venous Hypotension, Peripheral Venous Hypotension, Peripheral Venous Disease, Osteomyelitis, Neuropathy Disease, Osteomyelitis, Neuropathy Disease, Osteomyelitis, Neuropathy 10/09/2022 10/09/2022 03/24/2023 Date Acquired: 20 20 17  Weeks of Treatment: Open Open Open Wound Status: No No No Wound Recurrence: 0.3x0.6x0.2 0.4x0.8x0.1 0.9x1.5x0.1 Measurements L x W x D (cm) 0.141 0.251 1.06 A (cm) : rea 0.028 0.025 0.106 Volume (cm) : 80.10% 64.50% -1241.80% % Reduction in A rea: 60.60% 64.80% -1225.00% % Reduction in Volume: Category/Stage IV Category/Stage IV Full Thickness With Exposed Support Classification: Structures Medium Medium Medium Exudate A mount: Serous Serous Serous Exudate Type: amber amber amber Exudate Color: Distinct, outline attached Distinct, outline attached Distinct, outline attached Wound Margin: Medium (34-66%) Medium (34-66%) Medium (34-66%) Granulation A mount: Red, Pink Pink  Red Granulation Quality: Small (1-33%) Small (1-33%) Small (1-33%) Necrotic A mount: Fat Layer (Subcutaneous Tissue): Yes Fat Layer (Subcutaneous Tissue): Yes Fat Layer (Subcutaneous Tissue): Yes Exposed Structures: Joint: Yes Joint: Yes Bone: Yes Bone: Yes Bone: Yes Fascia: No Fascia: No Fascia: No Tendon: No Tendon: No Tendon: No Muscle: No Muscle: No Muscle: No Joint: No None None Small (1-33%) Epithelialization: N/A Debridement - Excisional Debridement - Excisional Debridement: Pre-procedure Verification/Time Out N/A 15:40 15:40 Taken: N/A Lidocaine 4% Topical Solution Lidocaine 4% Topical Solution Pain Control: N/A Subcutaneous, Slough Subcutaneous, Slough Tissue Debrided: N/A Skin/Subcutaneous Tissue Skin/Subcutaneous Tissue Level: N/A 0.25 1.06 Debridement A (sq cm): rea N/A Curette Curette Instrument: N/A Minimum Minimum Bleeding: N/A Pressure Pressure Hemostasis A chieved: N/A 0 0 Procedural Pain: N/A 0 0 Post Procedural Pain: N/A Procedure was tolerated well Procedure was tolerated well Debridement Treatment Response: N/A 0.4x0.8x0.1 0.9x1.5x0.1 Post Debridement Measurements L x W x D (cm) N/A 0.025 0.106 Post Debridement Volume: (cm) N/A Category/Stage IV N/A Post Debridement Stage: No Abnormalities Noted No Abnormalities Noted No Abnormalities Noted Periwound Skin Texture: Dry/Scaly: Yes Dry/Scaly: Yes No Abnormalities Noted Periwound Skin Moisture: No Abnormalities Noted Rubor: No Erythema: No Periwound Skin Color: No Abnormality No Abnormality No Abnormality Temperature: Yes Yes Yes Tenderness on Palpation: N/A Debridement Debridement Procedures Performed: Wound Number: 16 9 N/A Photos: N/A Left Knee Left Calcaneus N/A Wound Location: Trauma Pressure Injury N/A Wounding Event: Abrasion Pressure Ulcer N/A Primary Etiology: Cataracts, Anemia, Arrhythmia, Cataracts, Anemia, Arrhythmia, N/A Comorbid History: Congestive Heart  Failure, Congestive Heart Failure, Hypotension, Peripheral Venous Hypotension, Peripheral Venous Disease, Osteomyelitis, Neuropathy Disease, Osteomyelitis, Neuropathy 06/18/2023 10/22/2022 N/A Date Acquired: 4 20 N/A Weeks of Treatment: Open Open N/A Wound Status: No No N/A Wound Recurrence: 0.7x1.4x0.1 0.4x0.2x0.3 N/A Measurements L x W x D (cm) Maybury, Shawnese M ([id number]) [id number] Page 4 of 14 0.77 0.063 N/A A (cm) : rea 0.077 0.019 N/A Volume (cm) : 94.60% 80.90% N/A % Reduction in Area: 98.20% 80.80% N/A % Reduction in Volume: Full Thickness Without Exposed Category/Stage III N/A Classification: Support Structures Medium Small N/A Exudate A mount: Serosanguineous Serous N/A Exudate Type: red, brown amber N/A Exudate Color: Distinct, outline attached Distinct, outline attached N/A Wound Margin: Large (67-100%) Small (1-33%) N/A Granulation A mount: Red Pink, Pale N/A Granulation Quality: None Present (0%) Large (67-100%) N/A Necrotic A mount: Fat Layer (Subcutaneous Tissue): Yes Fat Layer (Subcutaneous Tissue): Yes N/A Exposed Structures: Fascia: No Fascia: No Tendon: No Tendon: No Muscle: No Muscle: No Joint: No Joint: No Bone: No Bone: No Small (1-33%) Medium (34-66%) N/A Epithelialization: Debridement - Excisional Debridement - Excisional N/A Debridement: Pre-procedure Verification/Time Out 15:40 15:40 N/A  Taken: Lidocaine 4% Topical Solution Lidocaine 4% Topical Solution N/A Pain Control: Subcutaneous, Slough Necrotic/Eschar, Callus, N/A Tissue Debrided: Subcutaneous, Slough Skin/Subcutaneous Tissue Skin/Subcutaneous Tissue N/A Level: 0.77 0.09 N/A Debridement A (sq cm): rea Curette Curette N/A Instrument: Minimum Minimum N/A Bleeding: Pressure Pressure N/A Hemostasis A chieved: 0 0 N/A Procedural Pain: 0 0 N/A Post Procedural Pain: Procedure was tolerated well Procedure was tolerated well N/A Debridement  Treatment Response: 0.7x1.4x0.1 0.4x0.3x0.3 N/A Post Debridement Measurements L x W x D (cm) 0.077 0.028 N/A Post Debridement Volume: (cm) N/A Category/Stage III N/A Post Debridement Stage: No Abnormalities Noted Callus: Yes N/A Periwound Skin Texture: No Abnormalities Noted Dry/Scaly: Yes N/A Periwound Skin Moisture: Ecchymosis: No No Abnormalities Noted N/A Periwound Skin Color: No Abnormality No Abnormality N/A Temperature: Yes Yes N/A Tenderness on Palpation: Debridement Debridement N/A Procedures Performed: Treatment Notes Electronic Signature(s) Signed: 07/29/2023 3:50:14 PM By: Duanne Guess MD FACS Entered By: Duanne Guess on 07/29/2023 12:50:13 -------------------------------------------------------------------------------- Multi-Disciplinary Care Plan Details Patient Name: Date of Service: Megan Reap. 07/29/2023 2:45 PM Medical Record Number: [id number] Patient Account Number: [id number] Date of Birth/Sex: Treating RN: July 04, 1928 (89 y.o. [name] Primary Care Dene Nazir: [name] Other Clinician: Referring Smantha Boakye: Treating Renesme Kerrigan/Extender: Tiajuana Amass in Treatment: 20 Multidisciplinary Care Plan reviewed with physician Active Inactive Abuse / Safety / Falls / Self Care Management Nursing Diagnoses: Impaired home maintenance Impaired physical mobility Potential for falls JARIYA, CANEPA ([id number]) [id number] Page 5 of 14 Goals: Patient/caregiver will verbalize/demonstrate measure taken to improve self care Date Initiated: 03/10/2023 Target Resolution Date: 08/07/2023 Goal Status: Active Patient/caregiver will verbalize/demonstrate measures taken to improve the patient's personal safety Date Initiated: 03/10/2023 Target Resolution Date: 08/07/2023 Goal Status: Active Interventions: Assess fall risk on admission and as needed Assess: immobility, friction, shearing, incontinence  upon admission and as needed Provide education on fall prevention Notes: pt fell 06/18/23 injuring left knee Wound/Skin Impairment Nursing Diagnoses: Impaired tissue integrity Knowledge deficit related to ulceration/compromised skin integrity Goals: Patient/caregiver will verbalize understanding of skin care regimen Date Initiated: 03/10/2023 Target Resolution Date: 08/07/2023 Goal Status: Active Interventions: Assess patient/caregiver ability to obtain necessary supplies Assess patient/caregiver ability to perform ulcer/skin care regimen upon admission and as needed Assess ulceration(s) every visit Treatment Activities: Skin care regimen initiated : 03/10/2023 Topical wound management initiated : 03/10/2023 Notes: Electronic Signature(s) Signed: 07/29/2023 5:11:33 PM By: Zenaida Deed RN, BSN Entered By: Zenaida Deed on 07/29/2023 12:37:42 -------------------------------------------------------------------------------- Pain Assessment Details Patient Name: Date of Service: Megan Reap. 07/29/2023 2:45 PM Medical Record Number: [id number] Patient Account Number: [id number] Date of Birth/Sex: Treating RN: 12-17-1927 (89 y.o. F) Primary Care Maximos Zayas: [name] Other Clinician: Referring Danique Hartsough: Treating Alexya Mcdaris/Extender: Claudie Leach Weeks in Treatment: 20 Active Problems Location of Pain Severity and Description of Pain Patient Has Paino No Site Locations Rate the pain. MELONIA, HAW ([id number]) [id number] Page 6 of 14 Rate the pain. Current Pain Level: 0 Worst Pain Level: 4 Least Pain Level: 0 Character of Pain Describe the Pain: Tender Pain Management and Medication Current Pain Management: Is the Current Pain Management Adequate: Adequate How does your wound impact your activities of daily livingo Sleep: No Bathing: No Appetite: No Relationship With Others: No Bladder Continence: No Emotions: No Bowel  Continence: No Work: No Toileting: No Drive: No Dressing: No Hobbies: No Electronic Signature(s) Signed: 07/29/2023 5:11:33 PM By: Zenaida Deed RN, BSN Entered By: Zenaida Deed on 07/29/2023 12:20:43 -------------------------------------------------------------------------------- Patient/Caregiver Education Details Patient Name: Date of  Service: DEIDREA, NIEBEL 11/20/2024andnbsp2:45 PM Medical Record Number: [id number] Patient Account Number: [id number] Date of Birth/Gender: Treating RN: 1928/03/21 (89 y.o. [name] Primary Care Physician: [name] Other Clinician: Referring Physician: Treating Physician/Extender: Tiajuana Amass in Treatment: 20 Education Assessment Education Provided To: Patient Education Topics Provided Safety: Methods: Explain/Verbal Responses: Reinforcements needed, State content correctly Tissue Oxygenation: Methods: Explain/Verbal Responses: Reinforcements needed, State content correctly Wound/Skin Impairment: Methods: Explain/Verbal Responses: Reinforcements needed, State content correctly FAIRY, DAVIDS ([id number]) [id number] Page 7 of 14 Electronic Signature(s) Signed: 07/29/2023 5:11:33 PM By: Zenaida Deed RN, BSN Entered By: Zenaida Deed on 07/29/2023 12:38:10 -------------------------------------------------------------------------------- Wound Assessment Details Patient Name: Date of Service: Megan Reap. 07/29/2023 2:45 PM Medical Record Number: [id number] Patient Account Number: [id number] Date of Birth/Sex: Treating RN: [birth year] (89 y.o. [name], [name] Primary Care Dalton Mille: [name] Other Clinician: Referring Nohemi Nicklaus: Treating Nael Petrosyan/Extender: Claudie Leach Weeks in Treatment: 20 Wound Status Wound Number: 10 Primary Pressure Ulcer Etiology: Wound Location: Left T Second oe Wound Open Wounding Event: Pressure  Injury Status: Date Acquired: 10/09/2022 Comorbid Cataracts, Anemia, Arrhythmia, Congestive Heart Failure, Weeks Of Treatment: 20 History: Hypotension, Peripheral Venous Disease, Osteomyelitis, Clustered Wound: No Neuropathy Photos Wound Measurements Length: (cm) 0.3 Width: (cm) 0.6 Depth: (cm) 0.2 Area: (cm) 0.141 Volume: (cm) 0.028 % Reduction in Area: 80.1% % Reduction in Volume: 60.6% Epithelialization: None Tunneling: No Undermining: No Wound Description Classification: Category/Stage IV Wound Margin: Distinct, outline attached Exudate Amount: Medium Exudate Type: Serous Exudate Color: amber Foul Odor After Cleansing: No Slough/Fibrino No Wound Bed Granulation Amount: Medium (34-66%) Exposed Structure Granulation Quality: Red, Pink Fascia Exposed: No Necrotic Amount: Small (1-33%) Fat Layer (Subcutaneous Tissue) Exposed: Yes Necrotic Quality: Adherent Slough Tendon Exposed: No Muscle Exposed: No Joint Exposed: Yes Bone Exposed: Yes Periwound Skin Texture Texture Color No Abnormalities Noted: Yes No Abnormalities Noted: Yes Moisture Temperature / Pain No Abnormalities Noted: Yes Temperature: No Abnormality Tenderness on Palpation: Yes Giovannini, Averey Judie Petit ([id number]) [id number] Page 8 of 14 Treatment Notes Wound #10 (Toe Second) Wound Laterality: Left Cleanser Soap and Water Discharge Instruction: May shower and wash wound with dial antibacterial soap and water prior to dressing change. Wound Cleanser Discharge Instruction: Cleanse the wound with wound cleanser prior to applying a clean dressing using gauze sponges, not tissue or cotton balls. Peri-Wound Care Sween Lotion (Moisturizing lotion) Discharge Instruction: Apply moisturizing lotion as directed Topical Primary Dressing Promogran Prisma Matrix, 4.34 (sq in) (silver collagen) Discharge Instruction: Moisten collagen with saline or hydrogel Secondary Dressing Woven Gauze  Sponge, Non-Sterile 4x4 in Discharge Instruction: Apply over primary dressing as directed. Secured With [employer], 4.5x3.1 (in/yd) Discharge Instruction: Secure with Kerlix as directed. 11M Medipore H Soft Cloth Surgical T ape, 4 x 10 (in/yd) Discharge Instruction: Secure with tape as directed. Compression Wrap Compression Stockings Add-Ons Electronic Signature(s) Signed: 07/29/2023 5:11:33 PM By: Zenaida Deed RN, BSN Entered By: Zenaida Deed on 07/29/2023 12:32:32 -------------------------------------------------------------------------------- Wound Assessment Details Patient Name: Date of Service: Megan Reap. 07/29/2023 2:45 PM Medical Record Number: [id number] Patient Account Number: [id number] Date of Birth/Sex: Treating RN: May 19, 1928 (89 y.o. [name] Primary Care Bellina Tokarczyk: [name] Other Clinician: Referring Jevaeh Shams: Treating Kc Sedlak/Extender: Claudie Leach Weeks in Treatment: 20 Wound Status Wound Number: 11 Primary Pressure Ulcer Etiology: Wound Location: Right T Second oe Wound Open Wounding Event: Pressure Injury Status: Date Acquired: 10/09/2022 Comorbid Cataracts, Anemia, Arrhythmia, Congestive Heart Failure, Weeks Of Treatment: 20 History: Hypotension,  Peripheral Venous Disease, Osteomyelitis, Clustered Wound: No Neuropathy Photos NAELA, RICHMAN ([id number]) [id number] Page 9 of 14 Wound Measurements Length: (cm) 0.4 Width: (cm) 0.8 Depth: (cm) 0.1 Area: (cm) 0.251 Volume: (cm) 0.025 % Reduction in Area: 64.5% % Reduction in Volume: 64.8% Epithelialization: None Tunneling: No Undermining: No Wound Description Classification: Category/Stage IV Wound Margin: Distinct, outline attached Exudate Amount: Medium Exudate Type: Serous Exudate Color: amber Foul Odor After Cleansing: No Slough/Fibrino Yes Wound Bed Granulation Amount: Medium (34-66%) Exposed Structure Granulation  Quality: Pink Fascia Exposed: No Necrotic Amount: Small (1-33%) Fat Layer (Subcutaneous Tissue) Exposed: Yes Necrotic Quality: Adherent Slough Tendon Exposed: No Muscle Exposed: No Joint Exposed: Yes Bone Exposed: Yes Periwound Skin Texture Texture Color No Abnormalities Noted: Yes No Abnormalities Noted: Yes Moisture Temperature / Pain No Abnormalities Noted: Yes Temperature: No Abnormality Tenderness on Palpation: Yes Treatment Notes Wound #11 (Toe Second) Wound Laterality: Right Cleanser Soap and Water Discharge Instruction: May shower and wash wound with dial antibacterial soap and water prior to dressing change. Wound Cleanser Discharge Instruction: Cleanse the wound with wound cleanser prior to applying a clean dressing using gauze sponges, not tissue or cotton balls. Peri-Wound Care Topical Primary Dressing Promogran Prisma Matrix, 4.34 (sq in) (silver collagen) Discharge Instruction: Moisten collagen with saline or hydrogel Secondary Dressing Woven Gauze Sponge, Non-Sterile 4x4 in Discharge Instruction: Apply over primary dressing as directed. Secured With [employer] Stretch Gauze Bandage, Sterile 2x75 (in/in) Discharge Instruction: Secure with stretch gauze as directed. 34M Medipore H Soft Cloth Surgical T ape, 4 x 10 (in/yd) Discharge Instruction: Secure with tape as directed. Compression [address] [place] M ([id number]) [id number] Page 10 of 14 Compression Stockings Add-Ons Electronic Signature(s) Signed: 07/29/2023 5:11:33 PM By: Zenaida Deed RN, BSN Entered By: Zenaida Deed on 07/29/2023 12:33:06 -------------------------------------------------------------------------------- Wound Assessment Details Patient Name: Date of Service: Megan Reap. 07/29/2023 2:45 PM Medical Record Number: [id number] Patient Account Number: [id number] Date of Birth/Sex: Treating RN: [birth year] (89 y.o. [name], [name] Primary Care Rey Dansby:  [name] Other Clinician: Referring Karter Hellmer: Treating Juno Bozard/Extender: Claudie Leach Weeks in Treatment: 20 Wound Status Wound Number: 13 Primary Arterial Insufficiency Ulcer Etiology: Wound Location: Left, Dorsal T Great oe Wound Open Wounding Event: Gradually Appeared Status: Date Acquired: 03/24/2023 Comorbid Cataracts, Anemia, Arrhythmia, Congestive Heart Failure, Weeks Of Treatment: 17 History: Hypotension, Peripheral Venous Disease, Osteomyelitis, Clustered Wound: No Neuropathy Photos Wound Measurements Length: (cm) 0.9 Width: (cm) 1.5 Depth: (cm) 0.1 Area: (cm) 1.06 Volume: (cm) 0.106 % Reduction in Area: -1241.8% % Reduction in Volume: -1225% Epithelialization: Small (1-33%) Tunneling: No Undermining: No Wound Description Classification: Full Thickness With Exposed Supp Wound Margin: Distinct, outline attached Exudate Amount: Medium Exudate Type: Serous Exudate Color: amber ort Structures Foul Odor After Cleansing: No Slough/Fibrino Yes Wound Bed Granulation Amount: Medium (34-66%) Exposed Structure Granulation Quality: Red Fascia Exposed: No Necrotic Amount: Small (1-33%) Fat Layer (Subcutaneous Tissue) Exposed: Yes Necrotic Quality: Adherent Slough, Bone Tendon Exposed: No Muscle Exposed: No Joint Exposed: No Bone Exposed: Yes Periwound Skin Texture Texture Color No Abnormalities Noted: Yes No Abnormalities Noted: Yes Yablonski, Jermia Judie Petit ([id number]) [id number] Page 11 of 14 Moisture Temperature / Pain No Abnormalities Noted: Yes Temperature: No Abnormality Tenderness on Palpation: Yes Treatment Notes Wound #13 (Toe Great) Wound Laterality: Dorsal, Left Cleanser Soap and Water Discharge Instruction: May shower and wash wound with dial antibacterial soap and water prior to dressing change. Wound Cleanser Discharge Instruction: Cleanse the wound with wound cleanser prior to applying a clean dressing  using gauze sponges, not tissue or cotton balls. Peri-Wound Care Topical Primary Dressing Promogran Prisma Matrix, 4.34 (sq in) (silver collagen) Discharge Instruction: Moisten collagen with saline or hydrogel Secondary Dressing Woven Gauze Sponge, Non-Sterile 4x4 in Discharge Instruction: Apply over primary dressing as directed. Secured With 41M Medipore H Soft Cloth Surgical T ape, 4 x 10 (in/yd) Discharge Instruction: Secure with tape as directed. Compression Wrap Compression Stockings Add-Ons Electronic Signature(s) Signed: 07/29/2023 5:11:33 PM By: Zenaida Deed RN, BSN Entered By: Zenaida Deed on 07/29/2023 12:33:32 -------------------------------------------------------------------------------- Wound Assessment Details Patient Name: Date of Service: Megan Reap. 07/29/2023 2:45 PM Medical Record Number: [id number] Patient Account Number: [id number] Date of Birth/Sex: Treating RN: 1928-04-05 (89 y.o. [name] Primary Care Trinadee Verhagen: [name] Other Clinician: Referring Paulette Rockford: Treating Loys Hoselton/Extender: Claudie Leach Weeks in Treatment: 20 Wound Status Wound Number: 16 Primary Abrasion Etiology: Wound Location: Left Knee Wound Open Wounding Event: Trauma Status: Date Acquired: 06/18/2023 Comorbid Cataracts, Anemia, Arrhythmia, Congestive Heart Failure, Weeks Of Treatment: 4 History: Hypotension, Peripheral Venous Disease, Osteomyelitis, Clustered Wound: No Neuropathy Photos Munley, [name] ([id number]) [id number] Page 12 of 14 Wound Measurements Length: (cm) 0.7 Width: (cm) 1.4 Depth: (cm) 0.1 Area: (cm) 0.77 Volume: (cm) 0.077 % Reduction in Area: 94.6% % Reduction in Volume: 98.2% Epithelialization: Small (1-33%) Tunneling: No Undermining: No Wound Description Classification: Full Thickness Without Exposed Support Structures Wound Margin: Distinct, outline attached Exudate Amount:  Medium Exudate Type: Serosanguineous Exudate Color: red, brown Foul Odor After Cleansing: No Slough/Fibrino No Wound Bed Granulation Amount: Large (67-100%) Exposed Structure Granulation Quality: Red Fascia Exposed: No Necrotic Amount: None Present (0%) Fat Layer (Subcutaneous Tissue) Exposed: Yes Tendon Exposed: No Muscle Exposed: No Joint Exposed: No Bone Exposed: No Periwound Skin Texture Texture Color No Abnormalities Noted: Yes No Abnormalities Noted: Yes Moisture Temperature / Pain No Abnormalities Noted: Yes Temperature: No Abnormality Tenderness on Palpation: Yes Treatment Notes Wound #16 (Knee) Wound Laterality: Left Cleanser Normal Saline Discharge Instruction: Cleanse the wound with Normal Saline prior to applying a clean dressing using gauze sponges, not tissue or cotton balls. Peri-Wound Care Topical Primary Dressing Hydrofera Blue Ready Transfer Foam, 4x5 (in/in) Discharge Instruction: Apply to wound bed as instructed Secondary Dressing Zetuvit Plus Silicone Border Dressing 5x5 (in/in) Discharge Instruction: Apply silicone border over primary dressing as directed. Secured With Compression Wrap Compression Stockings Facilities manager) Signed: 07/29/2023 5:11:33 PM By: Zenaida Deed RN, BSN Entered By: Zenaida Deed on 07/29/2023 12:34:10 Liming, [name] ([id number]) [id number] Page 13 of 14 -------------------------------------------------------------------------------- Wound Assessment Details Patient Name: Date of Service: NARYIAH, TORP 07/29/2023 2:45 PM Medical Record Number: [id number] Patient Account Number: [id number] Date of Birth/Sex: Treating RN: 1928/08/11 (89 y.o. [name], [name] Primary Care Brendalee Matthies: [name] Other Clinician: Referring Jamas Jaquay: Treating Marietta Sikkema/Extender: Claudie Leach Weeks in Treatment: 20 Wound Status Wound Number: 9 Primary Pressure  Ulcer Etiology: Wound Location: Left Calcaneus Wound Open Wounding Event: Pressure Injury Status: Date Acquired: 10/22/2022 Comorbid Cataracts, Anemia, Arrhythmia, Congestive Heart Failure, Weeks Of Treatment: 20 History: Hypotension, Peripheral Venous Disease, Osteomyelitis, Clustered Wound: No Neuropathy Photos Wound Measurements Length: (cm) Width: (cm) Depth: (cm) Area: (cm) Volume: (cm) 0.4 % Reduction in Area: 80.9% 0.2 % Reduction in Volume: 80.8% 0.3 Epithelialization: Medium (34-66%) 0.063 Tunneling: No 0.019 Undermining: No Wound Description Classification: Category/Stage III Wound Margin: Distinct, outline attached Exudate Amount: Small Exudate Type: Serous Exudate Color: amber Foul Odor After Cleansing: No Slough/Fibrino Yes Wound Bed Granulation Amount: Small (1-33%) Exposed Structure  Granulation Quality: Pink, Pale Fascia Exposed: No Necrotic Amount: Large (67-100%) Fat Layer (Subcutaneous Tissue) Exposed: Yes Necrotic Quality: Adherent Slough Tendon Exposed: No Muscle Exposed: No Joint Exposed: No Bone Exposed: No Periwound Skin Texture Texture Color No Abnormalities Noted: No No Abnormalities Noted: Yes Callus: Yes Temperature / Pain Temperature: No Abnormality Moisture No Abnormalities Noted: Yes Tenderness on Palpation: Yes Treatment Notes Wound #9 (Calcaneus) Wound Laterality: Left Merendino, Natina M ([id number]) [id number] Page 14 of 14 Cleanser Soap and Water Discharge Instruction: May shower and wash wound with dial antibacterial soap and water prior to dressing change. Wound Cleanser Discharge Instruction: Cleanse the wound with wound cleanser prior to applying a clean dressing using gauze sponges, not tissue or cotton balls. Peri-Wound Care Topical Primary Dressing Promogran Prisma Matrix, 4.34 (sq in) (silver collagen) Discharge Instruction: Moisten collagen with saline or hydrogel Secondary  Dressing ALLEVYN Heel 4 1/2in x 5 1/2in / 10.5cm x 13.5cm Discharge Instruction: Apply over primary dressing as directed. Woven Gauze Sponge, Non-Sterile 4x4 in Discharge Instruction: Apply over primary dressing as directed. Secured With [employer], 4.5x3.1 (in/yd) Discharge Instruction: Secure with Kerlix as directed. 100M Medipore H Soft Cloth Surgical T ape, 4 x 10 (in/yd) Discharge Instruction: Secure with tape as directed. Compression Wrap Compression Stockings Add-Ons Electronic Signature(s) Signed: 07/29/2023 5:11:33 PM By: Zenaida Deed RN, BSN Entered By: Zenaida Deed on 07/29/2023 12:34:56 -------------------------------------------------------------------------------- Vitals Details Patient Name: Date of Service: Megan Reap. 07/29/2023 2:45 PM Medical Record Number: [id number] Patient Account Number: [id number] Date of Birth/Sex: Treating RN: November 14, 1927 (89 y.o. F) Primary Care Sukhmani Fetherolf: [name] Other Clinician: Referring Shedrick Sarli: Treating Mubarak Bevens/Extender: Claudie Leach Weeks in Treatment: 20 Vital Signs Time Taken: 03:14 Temperature (F): 98.1 Height (in): 64 Pulse (bpm): 72 Weight (lbs): 157 Respiratory Rate (breaths/min): 18 Body Mass Index (BMI): 26.9 Blood Pressure (mmHg): 104/51 Reference Range: 80 - 120 mg / dl Electronic Signature(s) Signed: 07/29/2023 5:11:33 PM By: Zenaida Deed RN, BSN Entered By: Zenaida Deed on 07/29/2023 12:20:17

## 2023-08-26 ENCOUNTER — Encounter (HOSPITAL_BASED_OUTPATIENT_CLINIC_OR_DEPARTMENT_OTHER): Payer: Medicare HMO | Attending: General Surgery | Admitting: General Surgery

## 2023-08-26 DIAGNOSIS — Z9981 Dependence on supplemental oxygen: Secondary | ICD-10-CM

## 2023-08-26 DIAGNOSIS — I872 Venous insufficiency (chronic) (peripheral): Secondary | ICD-10-CM | POA: Insufficient documentation

## 2023-08-26 DIAGNOSIS — Z91041 Radiographic dye allergy status: Secondary | ICD-10-CM

## 2023-08-26 DIAGNOSIS — I130 Hypertensive heart and chronic kidney disease with heart failure and stage 1 through stage 4 chronic kidney disease, or unspecified chronic kidney disease: Secondary | ICD-10-CM | POA: Diagnosis present

## 2023-08-26 DIAGNOSIS — T45515A Adverse effect of anticoagulants, initial encounter: Secondary | ICD-10-CM | POA: Diagnosis present

## 2023-08-26 DIAGNOSIS — D62 Acute posthemorrhagic anemia: Secondary | ICD-10-CM | POA: Diagnosis present

## 2023-08-26 DIAGNOSIS — E860 Dehydration: Secondary | ICD-10-CM | POA: Diagnosis present

## 2023-08-26 DIAGNOSIS — K922 Gastrointestinal hemorrhage, unspecified: Secondary | ICD-10-CM | POA: Diagnosis not present

## 2023-08-26 DIAGNOSIS — Z885 Allergy status to narcotic agent status: Secondary | ICD-10-CM

## 2023-08-26 DIAGNOSIS — L89623 Pressure ulcer of left heel, stage 3: Secondary | ICD-10-CM | POA: Insufficient documentation

## 2023-08-26 DIAGNOSIS — Z89421 Acquired absence of other right toe(s): Secondary | ICD-10-CM

## 2023-08-26 DIAGNOSIS — N390 Urinary tract infection, site not specified: Secondary | ICD-10-CM | POA: Diagnosis present

## 2023-08-26 DIAGNOSIS — E871 Hypo-osmolality and hyponatremia: Secondary | ICD-10-CM | POA: Diagnosis present

## 2023-08-26 DIAGNOSIS — S8012XA Contusion of left lower leg, initial encounter: Secondary | ICD-10-CM | POA: Diagnosis present

## 2023-08-26 DIAGNOSIS — Z882 Allergy status to sulfonamides status: Secondary | ICD-10-CM

## 2023-08-26 DIAGNOSIS — J449 Chronic obstructive pulmonary disease, unspecified: Secondary | ICD-10-CM | POA: Insufficient documentation

## 2023-08-26 DIAGNOSIS — Z7982 Long term (current) use of aspirin: Secondary | ICD-10-CM

## 2023-08-26 DIAGNOSIS — L97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 without evidence of necrosis: Secondary | ICD-10-CM | POA: Insufficient documentation

## 2023-08-26 DIAGNOSIS — L89152 Pressure ulcer of sacral region, stage 2: Secondary | ICD-10-CM | POA: Diagnosis present

## 2023-08-26 DIAGNOSIS — N1831 Chronic kidney disease, stage 3a: Secondary | ICD-10-CM | POA: Diagnosis present

## 2023-08-26 DIAGNOSIS — Z880 Allergy status to penicillin: Secondary | ICD-10-CM

## 2023-08-26 DIAGNOSIS — L89620 Pressure ulcer of left heel, unstageable: Secondary | ICD-10-CM | POA: Diagnosis present

## 2023-08-26 DIAGNOSIS — Z87891 Personal history of nicotine dependence: Secondary | ICD-10-CM

## 2023-08-26 DIAGNOSIS — I5032 Chronic diastolic (congestive) heart failure: Secondary | ICD-10-CM | POA: Diagnosis present

## 2023-08-26 DIAGNOSIS — Z66 Do not resuscitate: Secondary | ICD-10-CM | POA: Diagnosis present

## 2023-08-26 DIAGNOSIS — D6859 Other primary thrombophilia: Secondary | ICD-10-CM | POA: Diagnosis present

## 2023-08-26 DIAGNOSIS — K5900 Constipation, unspecified: Secondary | ICD-10-CM | POA: Diagnosis not present

## 2023-08-26 DIAGNOSIS — I4891 Unspecified atrial fibrillation: Secondary | ICD-10-CM | POA: Insufficient documentation

## 2023-08-26 DIAGNOSIS — Z823 Family history of stroke: Secondary | ICD-10-CM

## 2023-08-26 DIAGNOSIS — D6832 Hemorrhagic disorder due to extrinsic circulating anticoagulants: Secondary | ICD-10-CM | POA: Diagnosis present

## 2023-08-26 DIAGNOSIS — S8011XA Contusion of right lower leg, initial encounter: Secondary | ICD-10-CM | POA: Diagnosis present

## 2023-08-26 DIAGNOSIS — Z886 Allergy status to analgesic agent status: Secondary | ICD-10-CM

## 2023-08-26 DIAGNOSIS — Z90710 Acquired absence of both cervix and uterus: Secondary | ICD-10-CM

## 2023-08-26 DIAGNOSIS — Z881 Allergy status to other antibiotic agents status: Secondary | ICD-10-CM

## 2023-08-26 DIAGNOSIS — Z79890 Hormone replacement therapy (postmenopausal): Secondary | ICD-10-CM

## 2023-08-26 DIAGNOSIS — Z79899 Other long term (current) drug therapy: Secondary | ICD-10-CM

## 2023-08-26 DIAGNOSIS — R296 Repeated falls: Secondary | ICD-10-CM | POA: Diagnosis present

## 2023-08-26 DIAGNOSIS — I739 Peripheral vascular disease, unspecified: Secondary | ICD-10-CM | POA: Insufficient documentation

## 2023-08-26 DIAGNOSIS — N1832 Chronic kidney disease, stage 3b: Secondary | ICD-10-CM | POA: Insufficient documentation

## 2023-08-26 DIAGNOSIS — I2699 Other pulmonary embolism without acute cor pulmonale: Secondary | ICD-10-CM | POA: Diagnosis present

## 2023-08-26 DIAGNOSIS — Z7901 Long term (current) use of anticoagulants: Secondary | ICD-10-CM

## 2023-08-27 ENCOUNTER — Encounter (HOSPITAL_COMMUNITY): Payer: Self-pay

## 2023-08-27 ENCOUNTER — Inpatient Hospital Stay (HOSPITAL_COMMUNITY): Payer: Medicare HMO

## 2023-08-27 ENCOUNTER — Other Ambulatory Visit: Payer: Self-pay

## 2023-08-27 ENCOUNTER — Inpatient Hospital Stay (HOSPITAL_COMMUNITY)
Admission: EM | Admit: 2023-08-27 | Discharge: 2023-09-01 | DRG: 377 | Disposition: A | Discharge status: Home Health | Payer: Medicare HMO | Attending: Family Medicine | Admitting: Family Medicine

## 2023-08-27 DIAGNOSIS — R791 Abnormal coagulation profile: Secondary | ICD-10-CM

## 2023-08-27 DIAGNOSIS — E860 Dehydration: Secondary | ICD-10-CM | POA: Diagnosis present

## 2023-08-27 DIAGNOSIS — I130 Hypertensive heart and chronic kidney disease with heart failure and stage 1 through stage 4 chronic kidney disease, or unspecified chronic kidney disease: Secondary | ICD-10-CM | POA: Diagnosis present

## 2023-08-27 DIAGNOSIS — K5900 Constipation, unspecified: Secondary | ICD-10-CM | POA: Diagnosis not present

## 2023-08-27 DIAGNOSIS — K922 Gastrointestinal hemorrhage, unspecified: Principal | ICD-10-CM

## 2023-08-27 DIAGNOSIS — E871 Hypo-osmolality and hyponatremia: Secondary | ICD-10-CM

## 2023-08-27 DIAGNOSIS — N390 Urinary tract infection, site not specified: Secondary | ICD-10-CM | POA: Diagnosis present

## 2023-08-27 DIAGNOSIS — I4891 Unspecified atrial fibrillation: Secondary | ICD-10-CM | POA: Diagnosis present

## 2023-08-27 DIAGNOSIS — Z66 Do not resuscitate: Secondary | ICD-10-CM | POA: Diagnosis present

## 2023-08-27 DIAGNOSIS — J449 Chronic obstructive pulmonary disease, unspecified: Secondary | ICD-10-CM | POA: Diagnosis present

## 2023-08-27 DIAGNOSIS — I5032 Chronic diastolic (congestive) heart failure: Secondary | ICD-10-CM | POA: Diagnosis present

## 2023-08-27 DIAGNOSIS — D649 Anemia, unspecified: Secondary | ICD-10-CM | POA: Diagnosis not present

## 2023-08-27 DIAGNOSIS — R296 Repeated falls: Secondary | ICD-10-CM | POA: Diagnosis present

## 2023-08-27 DIAGNOSIS — Z79899 Other long term (current) drug therapy: Secondary | ICD-10-CM | POA: Diagnosis not present

## 2023-08-27 DIAGNOSIS — D6859 Other primary thrombophilia: Secondary | ICD-10-CM | POA: Diagnosis present

## 2023-08-27 DIAGNOSIS — T45515A Adverse effect of anticoagulants, initial encounter: Secondary | ICD-10-CM | POA: Diagnosis present

## 2023-08-27 DIAGNOSIS — Z7901 Long term (current) use of anticoagulants: Secondary | ICD-10-CM | POA: Diagnosis not present

## 2023-08-27 DIAGNOSIS — L89620 Pressure ulcer of left heel, unstageable: Secondary | ICD-10-CM | POA: Diagnosis present

## 2023-08-27 DIAGNOSIS — N1831 Chronic kidney disease, stage 3a: Secondary | ICD-10-CM | POA: Diagnosis present

## 2023-08-27 DIAGNOSIS — L89102 Pressure ulcer of unspecified part of back, stage 2: Secondary | ICD-10-CM

## 2023-08-27 DIAGNOSIS — Z9981 Dependence on supplemental oxygen: Secondary | ICD-10-CM | POA: Diagnosis not present

## 2023-08-27 DIAGNOSIS — S8011XA Contusion of right lower leg, initial encounter: Secondary | ICD-10-CM | POA: Diagnosis present

## 2023-08-27 DIAGNOSIS — D62 Acute posthemorrhagic anemia: Secondary | ICD-10-CM | POA: Diagnosis present

## 2023-08-27 DIAGNOSIS — L89152 Pressure ulcer of sacral region, stage 2: Secondary | ICD-10-CM | POA: Diagnosis present

## 2023-08-27 DIAGNOSIS — S8012XA Contusion of left lower leg, initial encounter: Secondary | ICD-10-CM | POA: Diagnosis present

## 2023-08-27 DIAGNOSIS — D6832 Hemorrhagic disorder due to extrinsic circulating anticoagulants: Secondary | ICD-10-CM | POA: Diagnosis present

## 2023-08-27 DIAGNOSIS — I2699 Other pulmonary embolism without acute cor pulmonale: Secondary | ICD-10-CM | POA: Diagnosis present

## 2023-08-27 DIAGNOSIS — N179 Acute kidney failure, unspecified: Secondary | ICD-10-CM | POA: Diagnosis present

## 2023-08-27 LAB — COMPREHENSIVE METABOLIC PANEL
ALT: 16 U/L (ref 0–44)
AST: 30 U/L (ref 15–41)
Albumin: 3 g/dL — ABNORMAL LOW (ref 3.5–5.0)
Alkaline Phosphatase: 135 U/L — ABNORMAL HIGH (ref 38–126)
Anion gap: 10 (ref 5–15)
BUN: 19 mg/dL (ref 8–23)
CO2: 28 mmol/L (ref 22–32)
Calcium: 9.2 mg/dL (ref 8.9–10.3)
Chloride: 94 mmol/L — ABNORMAL LOW (ref 98–111)
Creatinine, Ser: 1.34 mg/dL — ABNORMAL HIGH (ref 0.44–1.00)
GFR, Estimated: 37 mL/min — ABNORMAL LOW (ref >60)
Glucose, Bld: 109 mg/dL — ABNORMAL HIGH (ref 70–99)
Potassium: 3.3 mmol/L — ABNORMAL LOW (ref 3.5–5.1)
Sodium: 132 mmol/L — ABNORMAL LOW (ref 135–145)
Total Bilirubin: 0.8 mg/dL (ref <1.2)
Total Protein: 7.3 g/dL (ref 6.5–8.1)

## 2023-08-27 LAB — CBC WITH DIFFERENTIAL/PLATELET
Abs Immature Granulocytes: 0.06 10*3/uL (ref 0.00–0.07)
Basophils Absolute: 0.1 10*3/uL (ref 0.0–0.1)
Basophils Relative: 1 %
Eosinophils Absolute: 0.3 10*3/uL (ref 0.0–0.5)
Eosinophils Relative: 4 %
HCT: 25.1 % — ABNORMAL LOW (ref 36.0–46.0)
Hemoglobin: 7.9 g/dL — ABNORMAL LOW (ref 12.0–15.0)
Immature Granulocytes: 1 %
Lymphocytes Relative: 13 %
Lymphs Abs: 1 10*3/uL (ref 0.7–4.0)
MCH: 29.5 pg (ref 26.0–34.0)
MCHC: 31.5 g/dL (ref 30.0–36.0)
MCV: 93.7 fL (ref 80.0–100.0)
Monocytes Absolute: 0.8 10*3/uL (ref 0.1–1.0)
Monocytes Relative: 10 %
Neutro Abs: 5.5 10*3/uL (ref 1.7–7.7)
Neutrophils Relative %: 71 %
Platelets: 291 10*3/uL (ref 150–400)
RBC: 2.68 MIL/uL — ABNORMAL LOW (ref 3.87–5.11)
RDW: 15.6 % — ABNORMAL HIGH (ref 11.5–15.5)
WBC: 7.8 10*3/uL (ref 4.0–10.5)
nRBC: 0 % (ref 0.0–0.2)

## 2023-08-27 LAB — PROTIME-INR
INR: 4.2 (ref 0.8–1.2)
Prothrombin Time: 41 s — ABNORMAL HIGH (ref 11.4–15.2)

## 2023-08-27 LAB — OSMOLALITY: Osmolality: 289 mosm/kg (ref 275–295)

## 2023-08-27 LAB — BRAIN NATRIURETIC PEPTIDE: B Natriuretic Peptide: 264.7 pg/mL — ABNORMAL HIGH (ref 0.0–100.0)

## 2023-08-27 LAB — POC OCCULT BLOOD, ED: Fecal Occult Bld: POSITIVE — AB

## 2023-08-27 MED ORDER — PHYTONADIONE 5 MG PO TABS
2.5000 mg | ORAL_TABLET | ORAL | Status: AC
  Administered 2023-08-27: 2.5 mg via ORAL
  Filled 2023-08-27: qty 1

## 2023-08-27 MED ORDER — POTASSIUM CHLORIDE 20 MEQ PO PACK
40.0000 meq | PACK | Freq: Once | ORAL | Status: AC
  Administered 2023-08-27: 40 meq via ORAL
  Filled 2023-08-27: qty 2

## 2023-08-27 MED ORDER — PANTOPRAZOLE SODIUM 40 MG IV SOLR
40.0000 mg | Freq: Two times a day (BID) | INTRAVENOUS | Status: DC
  Administered 2023-08-28 – 2023-08-30 (×6): 40 mg via INTRAVENOUS
  Filled 2023-08-27 (×6): qty 10

## 2023-08-27 MED ORDER — POLYETHYLENE GLYCOL 3350 17 G PO PACK
17.0000 g | PACK | Freq: Every day | ORAL | Status: DC | PRN
  Administered 2023-08-28: 17 g via ORAL
  Filled 2023-08-27: qty 1

## 2023-08-27 MED ORDER — CIPROFLOXACIN HCL 500 MG PO TABS
250.0000 mg | ORAL_TABLET | Freq: Every day | ORAL | Status: AC
  Administered 2023-08-27 – 2023-08-29 (×3): 250 mg via ORAL
  Filled 2023-08-27 (×3): qty 1

## 2023-08-27 MED ORDER — SODIUM CHLORIDE 0.9 % IV SOLN: INTRAVENOUS | Status: DC

## 2023-08-27 MED ORDER — CIPROFLOXACIN HCL 500 MG PO TABS: 250.0000 mg | ORAL_TABLET | Freq: Two times a day (BID) | ORAL | Status: DC

## 2023-08-27 MED ORDER — SODIUM CHLORIDE 0.9% FLUSH
3.0000 mL | Freq: Two times a day (BID) | INTRAVENOUS | Status: DC
  Administered 2023-08-28 – 2023-09-01 (×10): 3 mL via INTRAVENOUS

## 2023-08-27 MED ORDER — PANTOPRAZOLE SODIUM 40 MG IV SOLR
80.0000 mg | Freq: Once | INTRAVENOUS | Status: AC
  Administered 2023-08-27: 80 mg via INTRAVENOUS
  Filled 2023-08-27: qty 20

## 2023-08-27 NOTE — Assessment & Plan Note (Signed)
[facility] 2lpm oxygen. I will get baseline CXR.

## 2023-08-27 NOTE — Progress Notes (Signed)
Megan, [name] ([id number]) [id number] Page 1 of 13 Visit Report for 08/26/2023 Arrival Information Details Patient Name: Date of Service: [name] 08/26/2023 2:45 PM Medical Record Number: [id number] Patient Account Number: [id number] Date of Birth/Sex: Treating RN: [birth year] (89 y.o. [name], [name] Primary Care [name]: [name] Other Clinician: Referring [name]: Treating [name]/Extender: [name] Weeks in Treatment: 24 Visit Information History Since Last Visit Added or deleted any medications: Yes Patient Arrived: Wheel Chair Any new allergies or adverse reactions: No Arrival Time: 15:02 Had a fall or experienced change in Yes Accompanied By: daughter activities of daily living that may affect Transfer Assistance: None risk of falls: Patient Identification Verified: Yes Signs or symptoms of abuse/neglect since last visito No Secondary Verification Process Completed: Yes Hospitalized since last visit: No Patient Requires Transmission-Based Precautions: No Implantable device outside of the clinic excluding No Patient Has Alerts: Yes cellular tissue based products placed in the center Patient Alerts: ABI R: 0.48 L: 0.46 2/24 since last visit: TBI R: 0.39 L: 0.21 2/24 Has Dressing in Place as Prescribed: Yes Pain Present Now: No Electronic Signature(s) Signed: 08/26/2023 5:22:11 PM By: Zenaida Deed RN, BSN Entered By: Zenaida Deed on 08/26/2023 15:03:48 -------------------------------------------------------------------------------- Lower Extremity Assessment Details Patient Name: Date of Service: [name]. 08/26/2023 2:45 PM Medical Record Number: [id number] Patient Account Number: [id number] Date of Birth/Sex: Treating RN: [birth year] (89 y.o. [name] Primary Care [name]: [name] Other Clinician: Referring [name]: Treating [name]/Extender: [name] Weeks in Treatment: 24 Edema Assessment Assessed: [Left: No] [Right: No] Edema: [Left: Yes] [Right: Yes] Calf Left: Right: Point of Measurement: From Medial Instep 38 cm 34.5 cm Ankle Left: Right: Point of Measurement: From Medial Instep 24 cm 22.5 cm Vascular Assessment Pulses: Dorsalis Pedis Palpable: [Left:No] [Right:No] Extremity colors, hair growth, and conditions: [name] ([id number]) [Right:[id number] Page 2 of 13] Extremity Color: [Left:Hyperpigmented] [Right:Hyperpigmented] Hair Growth on Extremity: [Left:No] [Right:No] Temperature of Extremity: [Left:Warm < 3 seconds] [Right:Warm < 3 seconds] Electronic Signature(s) Signed: 08/26/2023 5:22:11 PM By: Zenaida Deed RN, BSN Entered By: Zenaida Deed on 08/26/2023 15:13:16 -------------------------------------------------------------------------------- Multi Wound Chart Details Patient Name: Date of Service: [name]. 08/26/2023 2:45 PM Medical Record Number: [id number] Patient Account Number: [id number] Date of Birth/Sex: Treating RN: [birth year] (89 y.o. F) Primary Care [name]: [name] Other Clinician: Referring [name]: Treating [name]/Extender: [name] Weeks in Treatment: 24 Vital Signs Height(in): 64 Pulse(bpm): 77 Weight(lbs): 157 Blood Pressure(mmHg): 130/45 Body Mass Index(BMI): 26.9 Temperature(F): 98.3 Respiratory Rate(breaths/min): 18 [10:Photos:] Left T Second oe Right T Second oe Left, Dorsal T Great oe Wound Location: Pressure Injury Pressure Injury Gradually Appeared Wounding Event: Pressure Ulcer Pressure Ulcer Arterial Insufficiency Ulcer Primary Etiology: Cataracts, Anemia, Arrhythmia, Cataracts, Anemia, Arrhythmia, Cataracts, Anemia, Arrhythmia, Comorbid History: Congestive Heart Failure, Congestive Heart Failure, Congestive Heart Failure, Hypotension, Peripheral Venous Hypotension, Peripheral Venous  Hypotension, Peripheral Venous Disease, Osteomyelitis, Neuropathy Disease, Osteomyelitis, NeuropathyDisease, Osteomyelitis, Neuropathy 10/09/2022 10/09/2022 03/24/2023 Date Acquired: 24 24 21  Weeks of Treatment: Open Open Open Wound Status: No No No Wound Recurrence: 0.4x0.7x0.2 0.4x0.7x0.4 1x1.6x0.2 Measurements L x W x D (cm) 0.22 0.22 1.257 A (cm) : rea 0.044 0.088 0.251 Volume (cm) : 68.90% 68.90% -1491.10% % Reduction in A rea: 38.00% -23.90% -3037.50% % Reduction in Volume: Category/Stage IV Category/Stage IV Full Thickness With Exposed Support Classification: Structures Medium Medium Medium Exudate A mount: Serous Serous Serous Exudate Type: amber amber amber Exudate Color: Distinct, outline attached Distinct,  outline attached Distinct, outline attached Wound Margin: Medium (34-66%) Medium (34-66%) Small (1-33%) Granulation A mount: Red, Pink Red, Pink Red, Pink Granulation Quality: Small (1-33%) Small (1-33%) Large (67-100%) Necrotic A mount: Fat Layer (Subcutaneous Tissue): Yes Fat Layer (Subcutaneous Tissue): Yes Fat Layer (Subcutaneous Tissue): Yes Exposed Structures: Joint: Yes Joint: Yes Bone: Yes Bone: Yes Bone: Yes Fascia: No Fascia: No Fascia: No Tendon: No Tendon: No Tendon: No Muscle: No Muscle: No Muscle: No Joint: No Small (1-33%) None Small (1-33%) Epithelialization: Debridement - Selective/Open Wound Debridement - Excisional Debridement - Excisional Debridement: Pre-procedure Verification/Time Out 15:30 15:30 15:30 [name], [name] ([id number]) [id number] Page 3 of 13 Taken: Lidocaine 4% Topical Solution Lidocaine 4% Topical Solution Lidocaine 4% Topical Solution Pain Control: Callus Subcutaneous, Slough Subcutaneous, Slough Tissue Debrided: Skin/Epidermis Skin/Subcutaneous Tissue Skin/Subcutaneous Tissue Level: 0.22 0.22 1.26 Debridement A (sq cm): rea Curette Curette Curette Instrument: None None  None Bleeding: 2 2 2  Procedural Pain: 1 1 1  Post Procedural Pain: Procedure was tolerated well Procedure was tolerated well Procedure was tolerated well Debridement Treatment Response: 0.4x0.7x0.2 0.4x0.7x0.4 1x1.6x0.2 Post Debridement Measurements L x W x D (cm) 0.044 0.088 0.251 Post Debridement Volume: (cm) Category/Stage IV Category/Stage IV N/A Post Debridement Stage: No Abnormalities Noted No Abnormalities Noted No Abnormalities Noted Periwound Skin Texture: Dry/Scaly: Yes Dry/Scaly: Yes No Abnormalities Noted Periwound Skin Moisture: No Abnormalities Noted Rubor: No Erythema: No Periwound Skin Color: No Abnormality No Abnormality No Abnormality Temperature: Yes Yes Yes Tenderness on Palpation: Debridement Debridement Debridement Procedures Performed: Wound Number: 16 9 N/A Photos: N/A Left Knee Left Calcaneus N/A Wound Location: Trauma Pressure Injury N/A Wounding Event: Abrasion Pressure Ulcer N/A Primary Etiology: Cataracts, Anemia, Arrhythmia, Cataracts, Anemia, Arrhythmia, N/A Comorbid History: Congestive Heart Failure, Congestive Heart Failure, Hypotension, Peripheral Venous Hypotension, Peripheral Venous Disease, Osteomyelitis, Neuropathy Disease, Osteomyelitis, Neuropathy 06/18/2023 10/22/2022 N/A Date Acquired: 8 24 N/A Weeks of Treatment: Open Open N/A Wound Status: No No N/A Wound Recurrence: 0.2x0.2x0.1 0.3x0.3x0.3 N/A Measurements L x W x D (cm) 0.031 0.071 N/A A (cm) : rea 0.003 0.021 N/A Volume (cm) : 99.80% 78.50% N/A % Reduction in A rea: 99.90% 78.80% N/A % Reduction in Volume: Full Thickness Without Exposed Category/Stage III N/A Classification: Support Structures Small Small N/A Exudate A mount: Serosanguineous Serous N/A Exudate Type: red, brown amber N/A Exudate Color: Distinct, outline attached Distinct, outline attached N/A Wound Margin: Large (67-100%) None Present (0%) N/A Granulation A mount: Red N/A  N/A Granulation Quality: None Present (0%) Large (67-100%) N/A Necrotic A mount: Fat Layer (Subcutaneous Tissue): Yes Fat Layer (Subcutaneous Tissue): Yes N/A Exposed Structures: Fascia: No Fascia: No Tendon: No Tendon: No Muscle: No Muscle: No Joint: No Joint: No Bone: No Bone: No Large (67-100%) Small (1-33%) N/A Epithelialization: N/A Debridement - Excisional N/A Debridement: Pre-procedure Verification/Time Out N/A 15:30 N/A Taken: N/A Lidocaine 4% Topical Solution N/A Pain Control: N/A Necrotic/Eschar, Subcutaneous, N/A Tissue Debrided: Slough N/A Skin/Subcutaneous Tissue N/A Level: N/A 0.07 N/A Debridement A (sq cm): rea N/A Curette N/A Instrument: N/A None N/A Bleeding: N/A 3 N/A Procedural Pain: N/A 1 N/A Post Procedural Pain: N/A Procedure was tolerated well N/A Debridement Treatment Response: N/A 0.3x0.3x0.3 N/A Post Debridement Measurements L x W x D (cm) N/A 0.021 N/A Post Debridement Volume: (cm) N/A Category/Stage III N/A Post Debridement Stage: No Abnormalities Noted Callus: Yes N/A Periwound Skin Texture: No Abnormalities Noted Dry/Scaly: Yes N/A Periwound Skin Moisture: Ecchymosis: No No Abnormalities Noted N/A Periwound Skin Color: No Abnormality No Abnormality N/A Temperature: [name],  [name] ([id number]) [id number] Page 4 of 13 Yes Yes N/A Tenderness on Palpation: N/A Debridement N/A Procedures Performed: Treatment Notes Electronic Signature(s) Signed: 08/26/2023 4:10:42 PM By: Duanne Guess MD FACS Entered By: Duanne Guess on 08/26/2023 15:39:36 -------------------------------------------------------------------------------- Multi-Disciplinary Care Plan Details Patient Name: Date of Service: [name]. 08/26/2023 2:45 PM Medical Record Number: [id number] Patient Account Number: [id number] Date of Birth/Sex: Treating RN: Jan 12, 1928 (89 y.o. [name] Primary Care Danika Kluender: [name] Other Clinician: Referring Blanka Rockholt: Treating Carrye Goller/Extender: Tiajuana Amass in Treatment: 24 Multidisciplinary Care Plan reviewed with physician Active Inactive Abuse / Safety / Falls / Self Care Management Nursing Diagnoses: Impaired home maintenance Impaired physical mobility Potential for falls Goals: Patient/caregiver will verbalize/demonstrate measure taken to improve self care Date Initiated: 03/10/2023 Target Resolution Date: 09/04/2023 Goal Status: Active Patient/caregiver will verbalize/demonstrate measures taken to improve the patient's personal safety Date Initiated: 03/10/2023 Target Resolution Date: 09/04/2023 Goal Status: Active Interventions: Assess fall risk on admission and as needed Assess: immobility, friction, shearing, incontinence upon admission and as needed Provide education on fall prevention Notes: pt fell 06/18/23 injuring left knee Wound/Skin Impairment Nursing Diagnoses: Impaired tissue integrity Knowledge deficit related to ulceration/compromised skin integrity Goals: Patient/caregiver will verbalize understanding of skin care regimen Date Initiated: 03/10/2023 Target Resolution Date: 09/04/2023 Goal Status: Active Interventions: Assess patient/caregiver ability to obtain necessary supplies Assess patient/caregiver ability to perform ulcer/skin care regimen upon admission and as needed Assess ulceration(s) every visit Treatment Activities: Skin care regimen initiated : 03/10/2023 Topical wound management initiated : 03/10/2023 Notes: [name], [name] ([id number]) [id number].pdf Page 5 of 13 Electronic Signature(s) Signed: 08/26/2023 5:22:11 PM By: Zenaida Deed RN, BSN Entered By: Zenaida Deed on 08/26/2023 15:23:04 -------------------------------------------------------------------------------- Pain Assessment Details Patient Name: Date of Service: [name]. 08/26/2023 2:45  PM Medical Record Number: [id number] Patient Account Number: [id number] Date of Birth/Sex: Treating RN: [birth year] (89 y.o. [name] Primary Care [place] Chelf: [name] Other Clinician: Referring Sahira Cataldi: Treating Azaiah Licciardi/Extender: [name] Weeks in Treatment: 24 Active Problems Location of Pain Severity and Description of Pain Patient Has Paino No Site Locations Rate the pain. Current Pain Level: 0 Pain Management and Medication Current Pain Management: Electronic Signature(s) Signed: 08/26/2023 5:22:11 PM By: Zenaida Deed RN, BSN Entered By: Zenaida Deed on 08/26/2023 15:04:33 -------------------------------------------------------------------------------- Patient/Caregiver Education Details Patient Name: Date of Service: [name] 12/18/2024andnbsp2:45 PM Medical Record Number: [id number] Patient Account Number: [id number] Date of Birth/Gender: Treating RN: [birth year] (89 y.o. [name] Primary Care Physician: [name] Other Clinician: Referring Physician: Treating Physician/Extender: Tiajuana Amass in Treatment: 24 Education Assessment Education Provided To: [name], [name] ([id number]) [id number] Page 6 of 13 Patient Education Topics Provided Safety: Methods: Explain/Verbal Responses: Reinforcements needed, State content correctly Tissue Oxygenation: Methods: Explain/Verbal Responses: Reinforcements needed, State content correctly Wound/Skin Impairment: Methods: Explain/Verbal Responses: Reinforcements needed, State content correctly Electronic Signature(s) Signed: 08/26/2023 5:22:11 PM By: Zenaida Deed RN, BSN Entered By: Zenaida Deed on 08/26/2023 15:24:07 -------------------------------------------------------------------------------- Wound Assessment Details Patient Name: Date of Service: [name]. 08/26/2023 2:45 PM Medical Record Number:  [id number] Patient Account Number: [id number] Date of Birth/Sex: Treating RN: Dec 25, 1927 (89 y.o. [name] Primary Care Yi Haugan: [name] Other Clinician: Referring Hyland Mollenkopf: Treating Breya Cass/Extender: [name] Weeks in Treatment: 24 Wound Status Wound Number: 10 Primary Pressure Ulcer Etiology: Wound Location: Left T Second oe Wound Open Wounding Event: Pressure Injury Status: Date Acquired: 10/09/2022 Comorbid Cataracts, Anemia, Arrhythmia,  Congestive Heart Failure, Weeks Of Treatment: 24 History: Hypotension, Peripheral Venous Disease, Osteomyelitis, Clustered Wound: No Neuropathy Photos Wound Measurements Length: (cm) 0.4 Width: (cm) 0.7 Depth: (cm) 0.2 Area: (cm) 0.22 Volume: (cm) 0.044 % Reduction in Area: 68.9% % Reduction in Volume: 38% Epithelialization: Small (1-33%) Tunneling: No Undermining: No Wound Description Classification: Category/Stage IV Wound Margin: Distinct, outline attached Exudate Amount: Medium Exudate Type: Serous Exudate Color: amber [name], [name] ([id number]) Wound Bed Granulation Amount: Medium (34-66%) Granulation Quality: Red, Pink Necrotic Amount: Small (1-33%) Necrotic Quality: Adherent Slough Foul Odor After Cleansing: No Slough/Fibrino No [id number] Page 7 of 13 Exposed Structure Fascia Exposed: No Fat Layer (Subcutaneous Tissue) Exposed: Yes Tendon Exposed: No Muscle Exposed: No Joint Exposed: Yes Bone Exposed: Yes Periwound Skin Texture Texture Color No Abnormalities Noted: Yes No Abnormalities Noted: Yes Moisture Temperature / Pain No Abnormalities Noted: Yes Temperature: No Abnormality Tenderness on Palpation: Yes Treatment Notes Wound #10 (Toe Second) Wound Laterality: Left Cleanser Soap and Water Discharge Instruction: May shower and wash wound with dial antibacterial soap and water prior to dressing change. Wound Cleanser Discharge Instruction:  Cleanse the wound with wound cleanser prior to applying a clean dressing using gauze sponges, not tissue or cotton balls. Peri-Wound Care Sween Lotion (Moisturizing lotion) Discharge Instruction: Apply moisturizing lotion as directed Topical Primary Dressing Promogran Prisma Matrix, 4.34 (sq in) (silver collagen) Discharge Instruction: Moisten collagen with saline or hydrogel Secondary Dressing Woven Gauze Sponge, Non-Sterile 4x4 in Discharge Instruction: Apply over primary dressing as directed. Secured With [employer], 4.5x3.1 (in/yd) Discharge Instruction: Secure with Kerlix as directed. 68M Medipore H Soft Cloth Surgical T ape, 4 x 10 (in/yd) Discharge Instruction: Secure with tape as directed. Compression Wrap Compression Stockings Add-Ons Electronic Signature(s) Signed: 08/26/2023 5:22:11 PM By: Zenaida Deed RN, BSN Entered By: Zenaida Deed on 08/26/2023 15:19:59 -------------------------------------------------------------------------------- Wound Assessment Details Patient Name: Date of Service: [name]. 08/26/2023 2:45 PM Medical Record Number: [id number] Patient Account Number: [id number] Date of Birth/Sex: Treating RN: July 28, 1928 (89 y.o. [name] Primary Care Duante Arocho: [name] Other Clinician: Referring Terrius Gentile: Treating Maleik Vanderzee/Extender: [name] Weeks in Treatment: [address] VENCES, [name] ([id number]) [id number] Page 8 of 13 Wound Number: 11 Primary Pressure Ulcer Etiology: Wound Location: Right T Second oe Wound Open Wounding Event: Pressure Injury Status: Date Acquired: 10/09/2022 Comorbid Cataracts, Anemia, Arrhythmia, Congestive Heart Failure, Weeks Of Treatment: 24 History: Hypotension, Peripheral Venous Disease, Osteomyelitis, Clustered Wound: No Neuropathy Photos Wound Measurements Length: (cm) 0.4 Width: (cm) 0.7 Depth: (cm) 0.4 Area: (cm) 0.22 Volume:  (cm) 0.088 % Reduction in Area: 68.9% % Reduction in Volume: -23.9% Epithelialization: None Tunneling: No Undermining: No Wound Description Classification: Category/Stage IV Wound Margin: Distinct, outline attached Exudate Amount: Medium Exudate Type: Serous Exudate Color: amber Foul Odor After Cleansing: No Slough/Fibrino Yes Wound Bed Granulation Amount: Medium (34-66%) Exposed Structure Granulation Quality: Red, Pink Fascia Exposed: No Necrotic Amount: Small (1-33%) Fat Layer (Subcutaneous Tissue) Exposed: Yes Necrotic Quality: Adherent Slough Tendon Exposed: No Muscle Exposed: No Joint Exposed: Yes Bone Exposed: Yes Periwound Skin Texture Texture Color No Abnormalities Noted: Yes No Abnormalities Noted: Yes Moisture Temperature / Pain No Abnormalities Noted: Yes Temperature: No Abnormality Tenderness on Palpation: Yes Treatment Notes Wound #11 (Toe Second) Wound Laterality: Right Cleanser Soap and Water Discharge Instruction: May shower and wash wound with dial antibacterial soap and water prior to dressing change. Wound Cleanser Discharge Instruction: Cleanse the wound with wound cleanser prior to applying a clean dressing  using gauze sponges, not tissue or cotton balls. Peri-Wound Care Topical Primary Dressing Promogran Prisma Matrix, 4.34 (sq in) (silver collagen) Discharge Instruction: Moisten collagen with saline or hydrogel Secondary Dressing Woven Gauze Sponge, Non-Sterile 4x4 in Discharge Instruction: Apply over primary dressing as directed. MALIA, BEM ([id number]) [id number] Page 9 of 13 Secured With Insurance underwriter, Sterile 2x75 (in/in) Discharge Instruction: Secure with stretch gauze as directed. 67M Medipore H Soft Cloth Surgical T ape, 4 x 10 (in/yd) Discharge Instruction: Secure with tape as directed. Compression Wrap Compression Stockings Add-Ons Electronic Signature(s) Signed: 08/26/2023  5:22:11 PM By: Zenaida Deed RN, BSN Entered By: Zenaida Deed on 08/26/2023 15:20:34 -------------------------------------------------------------------------------- Wound Assessment Details Patient Name: Date of Service: [name]. 08/26/2023 2:45 PM Medical Record Number: [id number] Patient Account Number: [id number] Date of Birth/Sex: Treating RN: 1928/07/23 (89 y.o. [name], [name] Primary Care Platon Arocho: [name] Other Clinician: Referring Kanasia Gayman: Treating Ayvah Caroll/Extender: [name] Weeks in Treatment: 24 Wound Status Wound Number: 13 Primary Arterial Insufficiency Ulcer Etiology: Wound Location: Left, Dorsal T Great oe Wound Open Wounding Event: Gradually Appeared Status: Date Acquired: 03/24/2023 Comorbid Cataracts, Anemia, Arrhythmia, Congestive Heart Failure, Weeks Of Treatment: 21 History: Hypotension, Peripheral Venous Disease, Osteomyelitis, Clustered Wound: No Neuropathy Photos Wound Measurements Length: (cm) 1 Width: (cm) 1.6 Depth: (cm) 0.2 Area: (cm) 1.257 Volume: (cm) 0.251 % Reduction in Area: -1491.1% % Reduction in Volume: -3037.5% Epithelialization: Small (1-33%) Tunneling: No Undermining: No Wound Description Classification: Full Thickness With Exposed Supp Wound Margin: Distinct, outline attached Exudate Amount: Medium Exudate Type: Serous Exudate Color: amber ort Structures Foul Odor After Cleansing: No Slough/Fibrino Yes Wound Bed Granulation Amount: Small (1-33%) Exposed Structure Granulation Quality: Red, Pink Fascia Exposed: No Necrotic Amount: Large (67-100%) Fat Layer (Subcutaneous Tissue) Exposed: Yes Weems, [name] ([id number]) [id number] Page 10 of 13 Necrotic Quality: Adherent Slough, Bone Tendon Exposed: No Muscle Exposed: No Joint Exposed: No Bone Exposed: Yes Periwound Skin Texture Texture Color No Abnormalities Noted: Yes No Abnormalities Noted:  Yes Moisture Temperature / Pain No Abnormalities Noted: Yes Temperature: No Abnormality Tenderness on Palpation: Yes Treatment Notes Wound #13 (Toe Great) Wound Laterality: Dorsal, Left Cleanser Soap and Water Discharge Instruction: May shower and wash wound with dial antibacterial soap and water prior to dressing change. Wound Cleanser Discharge Instruction: Cleanse the wound with wound cleanser prior to applying a clean dressing using gauze sponges, not tissue or cotton balls. Peri-Wound Care Topical Primary Dressing Promogran Prisma Matrix, 4.34 (sq in) (silver collagen) Discharge Instruction: Moisten collagen with saline or hydrogel Secondary Dressing Woven Gauze Sponge, Non-Sterile 4x4 in Discharge Instruction: Apply over primary dressing as directed. Secured With 67M Medipore H Soft Cloth Surgical T ape, 4 x 10 (in/yd) Discharge Instruction: Secure with tape as directed. Compression Wrap Compression Stockings Add-Ons Electronic Signature(s) Signed: 08/26/2023 5:22:11 PM By: Zenaida Deed RN, BSN Entered By: Zenaida Deed on 08/26/2023 15:21:32 -------------------------------------------------------------------------------- Wound Assessment Details Patient Name: Date of Service: [name]. 08/26/2023 2:45 PM Medical Record Number: [id number] Patient Account Number: [id number] Date of Birth/Sex: Treating RN: 1928/01/28 (89 y.o. [name] Primary Care Terah Robey: [name] Other Clinician: Referring Jaysun Wessels: Treating Keyonna Comunale/Extender: [name] Weeks in Treatment: 24 Wound Status Wound Number: 16 Primary Abrasion Etiology: Wound Location: Left Knee Wound Open Wounding Event: Trauma Status: Date Acquired: 06/18/2023 Comorbid Cataracts, Anemia, Arrhythmia, Congestive Heart Failure, Weeks Of Treatment: 8 History: Hypotension, Peripheral Venous Disease, Osteomyelitis, Clustered Wound: No Neuropathy Photos [name], [name]  ([id number])  [id number] Page 11 of 13 Wound Measurements Length: (cm) 0.2 Width: (cm) 0.2 Depth: (cm) 0.1 Area: (cm) 0.031 Volume: (cm) 0.003 % Reduction in Area: 99.8% % Reduction in Volume: 99.9% Epithelialization: Large (67-100%) Tunneling: No Undermining: No Wound Description Classification: Full Thickness Without Exposed Support Structures Wound Margin: Distinct, outline attached Exudate Amount: Small Exudate Type: Serosanguineous Exudate Color: red, brown Foul Odor After Cleansing: No Slough/Fibrino No Wound Bed Granulation Amount: Large (67-100%) Exposed Structure Granulation Quality: Red Fascia Exposed: No Necrotic Amount: None Present (0%) Fat Layer (Subcutaneous Tissue) Exposed: Yes Tendon Exposed: No Muscle Exposed: No Joint Exposed: No Bone Exposed: No Periwound Skin Texture Texture Color No Abnormalities Noted: Yes No Abnormalities Noted: Yes Moisture Temperature / Pain No Abnormalities Noted: Yes Temperature: No Abnormality Tenderness on Palpation: Yes Treatment Notes Wound #16 (Knee) Wound Laterality: Left Cleanser Normal Saline Discharge Instruction: Cleanse the wound with Normal Saline prior to applying a clean dressing using gauze sponges, not tissue or cotton balls. Peri-Wound Care Topical Primary Dressing Hydrofera Blue Ready Transfer Foam, 4x5 (in/in) Discharge Instruction: Apply to wound bed as instructed Secondary Dressing Zetuvit Plus Silicone Border Dressing 5x5 (in/in) Discharge Instruction: Apply silicone border over primary dressing as directed. Secured With Compression Wrap Compression Stockings Facilities manager) Signed: 08/26/2023 5:22:11 PM By: Zenaida Deed RN, BSN Entered By: Zenaida Deed on 08/26/2023 15:21:59 Arvidson, [name] ([id number]) [id number] Page 12 of 13 -------------------------------------------------------------------------------- Wound  Assessment Details Patient Name: Date of Service: [name], [name] 08/26/2023 2:45 PM Medical Record Number: [id number] Patient Account Number: [id number] Date of Birth/Sex: Treating RN: [birth year] (89 y.o. [name], [name] Primary Care Keliyah Spillman: [name] Other Clinician: Referring Alessia Gonsalez: Treating Kasiya Burck/Extender: [name] Weeks in Treatment: 24 Wound Status Wound Number: 9 Primary Pressure Ulcer Etiology: Wound Location: Left Calcaneus Wound Open Wounding Event: Pressure Injury Status: Date Acquired: 10/22/2022 Comorbid Cataracts, Anemia, Arrhythmia, Congestive Heart Failure, Weeks Of Treatment: 24 History: Hypotension, Peripheral Venous Disease, Osteomyelitis, Clustered Wound: No Neuropathy Photos Wound Measurements Length: (cm) Width: (cm) Depth: (cm) Area: (cm) Volume: (cm) 0.3 % Reduction in Area: 78.5% 0.3 % Reduction in Volume: 78.8% 0.3 Epithelialization: Small (1-33%) 0.071 Tunneling: No 0.021 Undermining: No Wound Description Classification: Category/Stage III Wound Margin: Distinct, outline attached Exudate Amount: Small Exudate Type: Serous Exudate Color: amber Foul Odor After Cleansing: No Slough/Fibrino Yes Wound Bed Granulation Amount: None Present (0%) Exposed Structure Necrotic Amount: Large (67-100%) Fascia Exposed: No Necrotic Quality: Adherent Slough Fat Layer (Subcutaneous Tissue) Exposed: Yes Tendon Exposed: No Muscle Exposed: No Joint Exposed: No Bone Exposed: No Periwound Skin Texture Texture Color No Abnormalities Noted: No No Abnormalities Noted: Yes Callus: Yes Temperature / Pain Temperature: No Abnormality Moisture No Abnormalities Noted: Yes Tenderness on Palpation: Yes Treatment Notes Wound #9 (Calcaneus) Wound Laterality: Left [name], [name] [name] ([id number]) [id number] Page 13 of 13 Cleanser Soap and Water Discharge Instruction: May shower and wash wound with dial  antibacterial soap and water prior to dressing change. Wound Cleanser Discharge Instruction: Cleanse the wound with wound cleanser prior to applying a clean dressing using gauze sponges, not tissue or cotton balls. Peri-Wound Care Topical Primary Dressing Promogran Prisma Matrix, 4.34 (sq in) (silver collagen) Discharge Instruction: Moisten collagen with saline or hydrogel Secondary Dressing ALLEVYN Heel 4 1/2in x 5 1/2in / 10.5cm x 13.5cm Discharge Instruction: Apply over primary dressing as directed. Woven Gauze Sponge, Non-Sterile 4x4 in Discharge Instruction: Apply over primary dressing as directed. Secured With [employer], 4.5x3.1 (in/yd) Discharge Instruction: Secure with [employer]  as directed. 8M Medipore H Soft Cloth Surgical T ape, 4 x 10 (in/yd) Discharge Instruction: Secure with tape as directed. Compression Wrap Compression Stockings Add-Ons Electronic Signature(s) Signed: 08/26/2023 5:22:11 PM By: Zenaida Deed RN, BSN Entered By: Zenaida Deed on 08/26/2023 15:22:31 -------------------------------------------------------------------------------- Vitals Details Patient Name: Date of Service: [name]. 08/26/2023 2:45 PM Medical Record Number: [id number] Patient Account Number: [id number] Date of Birth/Sex: Treating RN: 1928/07/15 (89 y.o. [name] Primary Care Isaak Delmundo: [name] Other Clinician: Referring Nayelli Inglis: Treating Rakan Soffer/Extender: [name] Weeks in Treatment: 24 Vital Signs Time Taken: 15:03 Temperature (F): 98.3 Height (in): 64 Pulse (bpm): 77 Weight (lbs): 157 Respiratory Rate (breaths/min): 18 Body Mass Index (BMI): 26.9 Blood Pressure (mmHg): 130/45 Reference Range: 80 - 120 mg / dl Electronic Signature(s) Signed: 08/26/2023 5:22:11 PM By: Zenaida Deed RN, BSN Entered By: Zenaida Deed on 08/26/2023 15:58:52

## 2023-08-27 NOTE — Progress Notes (Signed)
TAQUASIA, SAWICKI ([id number]) [id number] Page 1 of 14 Visit Report for 08/26/2023 Chief Complaint Document Details Patient Name: Date of Service: [name], [name] 08/26/2023 2:45 PM Medical Record Number: [id number] Patient Account Number: [id number] Date of Birth/Sex: Treating RN: [birth year] (89 y.o. F) Primary Care Provider: [name] Other Clinician: Referring Provider: Treating Provider/Extender: Tiajuana Amass in Treatment: 24 Information Obtained from: Patient Chief Complaint 01/28/2022; right second toe amputation site dehiscence and bilateral lower extremity wounds. 03/10/2023: pressure ulcers of right heel, ulcers on toes Electronic Signature(s) Signed: 08/26/2023 4:10:42 PM By: Duanne Guess MD FACS Entered By: Duanne Guess on 08/26/2023 12:39:46 -------------------------------------------------------------------------------- Debridement Details Patient Name: Date of Service: [name] 08/26/2023 2:45 PM Medical Record Number: [id number] Patient Account Number: [id number] Date of Birth/Sex: Treating RN: Sep 21, 1927 (89 y.o. [name], [name] Primary Care Provider: [name] Other Clinician: Referring Provider: Treating Provider/Extender: Claudie Leach Weeks in Treatment: 24 Debridement Performed for Assessment: Wound #10 Left T Second oe Performed By: Physician Duanne Guess, MD The following information was scribed by: Zenaida Deed The information was scribed for: Duanne Guess Debridement Type: Debridement Level of Consciousness (Pre-procedure): Awake and Alert Pre-procedure Verification/Time Out Yes - 15:30 Taken: Start Time: 15:30 Pain Control: Lidocaine 4% Topical Solution Percent of Wound Bed Debrided: 100% T Area Debrided (cm): otal 0.22 Tissue and other material debrided: Non-Viable, Callus, Skin: Epidermis Level: Skin/Epidermis Debridement Description:  Selective/Open Wound Instrument: Curette Bleeding: None Procedural Pain: 2 Post Procedural Pain: 1 Response to Treatment: Procedure was tolerated well Level of Consciousness (Post- Awake and Alert procedure): Post Debridement Measurements of Total Wound Length: (cm) 0.4 Stage: Category/Stage IV Width: (cm) 0.7 Depth: (cm) 0.2 Volume: (cm) 0.044 Zelenak, Kaelee M ([id number]) [id number] Page 2 of 14 Character of Wound/Ulcer Post Debridement: Stable Post Procedure Diagnosis Same as Pre-procedure Electronic Signature(s) Signed: 08/26/2023 4:10:42 PM By: Duanne Guess MD FACS Signed: 08/26/2023 5:22:11 PM By: Zenaida Deed RN, BSN Entered By: Zenaida Deed on 08/26/2023 12:33:24 -------------------------------------------------------------------------------- Debridement Details Patient Name: Date of Service: [name] 08/26/2023 2:45 PM Medical Record Number: [id number] Patient Account Number: [id number] Date of Birth/Sex: Treating RN: 1928/05/09 (89 y.o. [name] Primary Care Provider: [name] Other Clinician: Referring Provider: Treating Provider/Extender: Claudie Leach Weeks in Treatment: 24 Debridement Performed for Assessment: Wound #13 Left,Dorsal T Great oe Performed By: Physician Duanne Guess, MD The following information was scribed by: Zenaida Deed The information was scribed for: Duanne Guess Debridement Type: Debridement Severity of Tissue Pre Debridement: Necrosis of bone Level of Consciousness (Pre-procedure): Awake and Alert Pre-procedure Verification/Time Out Yes - 15:30 Taken: Start Time: 15:30 Pain Control: Lidocaine 4% T opical Solution Percent of Wound Bed Debrided: 100% T Area Debrided (cm): otal 1.26 Tissue and other material debrided: Viable, Non-Viable, Slough, Subcutaneous, Slough Level: Skin/Subcutaneous Tissue Debridement Description: Excisional Instrument:  Curette Bleeding: None Procedural Pain: 2 Post Procedural Pain: 1 Response to Treatment: Procedure was tolerated well Level of Consciousness (Post- Awake and Alert procedure): Post Debridement Measurements of Total Wound Length: (cm) 1 Width: (cm) 1.6 Depth: (cm) 0.2 Volume: (cm) 0.251 Character of Wound/Ulcer Post Debridement: Stable Severity of Tissue Post Debridement: Necrosis of bone Post Procedure Diagnosis Same as Pre-procedure Electronic Signature(s) Signed: 08/26/2023 4:10:42 PM By: Duanne Guess MD FACS Signed: 08/26/2023 5:22:11 PM By: Zenaida Deed RN, BSN Entered By: Zenaida Deed on 08/26/2023 12:34:14 Penagos, [name] ([id number]) [id number] Page 3 of 14 -------------------------------------------------------------------------------- Debridement Details Patient Name: Date of Service: [name],  [name] 08/26/2023 2:45 PM Medical Record Number: [id number] Patient Account Number: [id number] Date of Birth/Sex: Treating RN: 21-May-1928 (89 y.o. [name] Primary Care Provider: [name] Other Clinician: Referring Provider: Treating Provider/Extender: Claudie Leach Weeks in Treatment: 24 Debridement Performed for Assessment: Wound #11 Right T Second oe Performed By: Physician Duanne Guess, MD The following information was scribed by: Zenaida Deed The information was scribed for: Duanne Guess Debridement Type: Debridement Level of Consciousness (Pre-procedure): Awake and Alert Pre-procedure Verification/Time Out Yes - 15:30 Taken: Start Time: 15:30 Pain Control: Lidocaine 4% T opical Solution Percent of Wound Bed Debrided: 100% T Area Debrided (cm): otal 0.22 Tissue and other material debrided: Viable, Non-Viable, Slough, Subcutaneous, Skin: Epidermis, Slough Level: Skin/Subcutaneous Tissue Debridement Description: Excisional Instrument: Curette Bleeding: None Procedural Pain: 2 Post  Procedural Pain: 1 Response to Treatment: Procedure was tolerated well Level of Consciousness (Post- Awake and Alert procedure): Post Debridement Measurements of Total Wound Length: (cm) 0.4 Stage: Category/Stage IV Width: (cm) 0.7 Depth: (cm) 0.4 Volume: (cm) 0.088 Character of Wound/Ulcer Post Debridement: Stable Post Procedure Diagnosis Same as Pre-procedure Electronic Signature(s) Signed: 08/26/2023 4:10:42 PM By: Duanne Guess MD FACS Signed: 08/26/2023 5:22:11 PM By: Zenaida Deed RN, BSN Entered By: Zenaida Deed on 08/26/2023 12:35:31 -------------------------------------------------------------------------------- Debridement Details Patient Name: Date of Service: [name] 08/26/2023 2:45 PM Medical Record Number: [id number] Patient Account Number: [id number] Date of Birth/Sex: Treating RN: [birth year] (89 y.o. [name] Primary Care Provider: [name] Other Clinician: Referring Provider: Treating Provider/Extender: Claudie Leach Weeks in Treatment: 24 Debridement Performed for Assessment: Wound #9 Left Calcaneus Performed By: Physician Duanne Guess, MD Debridement Type: Debridement Level of Consciousness (Pre-procedure): Awake and Alert Pre-procedure Verification/Time Out Yes - 15:30 Taken: Start Time: 15:30 Pain Control: Lidocaine 4% T opical Solution Percent of Wound Bed Debrided: 100% T Area Debrided (cm): otal 0.07 Tissue and other material debrided: Viable, Non-Viable, Eschar, Slough, Subcutaneous, Skin: Epidermis, Slough Level: Skin/Subcutaneous Tissue Debridement Description: Excisional Morning, [name] ([id number]) [id number] Page 4 of 14 Instrument: Curette Bleeding: None Procedural Pain: 3 Post Procedural Pain: 1 Response to Treatment: Procedure was tolerated well Level of Consciousness (Post- Awake and Alert procedure): Post Debridement Measurements of Total Wound Length:  (cm) 0.3 Stage: Category/Stage III Width: (cm) 0.3 Depth: (cm) 0.3 Volume: (cm) 0.021 Character of Wound/Ulcer Post Debridement: Stable Post Procedure Diagnosis Same as Pre-procedure Electronic Signature(s) Signed: 08/26/2023 4:10:42 PM By: Duanne Guess MD FACS Signed: 08/26/2023 5:22:11 PM By: Zenaida Deed RN, BSN Entered By: Zenaida Deed on 08/26/2023 12:36:18 -------------------------------------------------------------------------------- HPI Details Patient Name: Date of Service: [name] 08/26/2023 2:45 PM Medical Record Number: [id number] Patient Account Number: [id number] Date of Birth/Sex: Treating RN: Apr 23, 1928 (89 y.o. F) Primary Care Provider: [name] Other Clinician: Referring Provider: Treating Provider/Extender: Claudie Leach Weeks in Treatment: 24 History of Present Illness HPI Description: Admission 01/28/2022 Ms. 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 She states she has tried Medihoney and silver alginate in the past to this area with little benefit. She also has 2 small areas limited to skin breakdown to her lower extremities bilaterally. She has chronic venous insufficiency but not has not been wearing her compression stockings.  She states she bumped her legs against an object and not so the wound started. She has been using Medihoney to the sites. She denies signs of infection. 6/2; patient presents for follow-up. She had an x-ray of her right foot done at last clinic visit and this was negative for evidence of osteomyelitis. She also had a wound culture done that showed extra high levels of Staph aureus. I recommended Keystone antibiotics for  this and this was ordered. She had ABIs with TBI's done as well that showed monophasic waveforms to the right foot with TBI of 0 and an ABI of 0.52. Urgent referral was made to vein and vascular and she saw Dr. Durwin Nora on 6/1, yesterday and he recommended an arteriogram. This is scheduled for 6/16. Patient also reports a new wound to the right great toe. This is a blister that has ruptured. She also reports increased erythema to the toe. 6/6; the patient was worked in urgently today at the insistence of her daughter out of concern for a new wound on the lateral part of the plantar right great toe. She has her original postsurgical wound after the amputation of the right second toe, she has a wound on the medial part of the right great toe. The patient is apparently going for an angiogram by Dr. Durwin Nora in 2 weeks time. 6/13; patient presents for follow-up. She has been using bacitracin to the abrasion on the right great toe. She has been using collagen and Keystone antibiotic to the amputation site. She has no issues or complaints today. She denies signs of infection. 6/22; patient presents for follow-up. She states that her abdominal aortogram was canceled due to her renal function. She has been using Keystone antibiotics to the amputation site and Medihoney to the right great toe wound. At the pace of the right great toe she has a slitlike open area that she thinks was caused by the tape from the dressing. 6/29; patient presents for follow-up. She has been using Keystone antibiotics to the amputation site along with collagen. She has been using Medihoney to the right great toe wound. She has no other wounds. She denies signs of infection. 7/13; patient presents for follow-up. She has been using Keystone antibiotics and collagen to the wound sites. She currently denies signs of infection. She states she is scheduled to see me nephrology next month. 7/20; patient presents for follow-up. She continue  Keystone antibiotics and collagen to the wound sites. She has no issues or complaints today. 8/1; patient presents for follow up. She continues to use keystone antibiotics and collagen to the wound sites. She has no issues or complaints today. 8/15; patient presents for follow-up. She has been using Keystone antibiotics and collagen to the wound sites. She followed up with her nephrologist who made medication changes. She is supposed to get a repeat BMP in 2 weeks. Her decrease in renal function was a limiting factor in obtaining an arteriogram for potential intervention for revascularization. She currently denies signs of infection. KAYELEE, ABID ([id number]) [id number] Page 5 of 14 9/2; patient presents for follow-up. She has been using Keystone antibiotic and collagen to the wound beds. She has no issues or complaints today. Reports there has been improvement in kidney function however not cleared to have her arteriogram just yet. 10/10; patient presents for follow-up. She has been using Keystone antibiotic and collagen to the wound beds. She reports 2 new wounds 1 to the anterior right lower extremity and another to the plantar  aspect of the right foot. She states that the right plantar foot wound was caused by the home health nurse changing the dressing and causing a skin tear. She is not sure how the right anterior leg wound started. It appears to be from trauma. She denies signs of infection. 10/19; patient presents for follow-up. She has been using Keystone antibiotic and collagen to the right great toe wound. She is been using silver alginate to the right anterior and right plantar foot wound. She has been using Tubigrip to the right lower extremity. The plantar foot wound has healed. She has no issues or complaints today. 11/3; since the patient was last here she was seen in urgent care apparently for an area on the dorsal aspect of the right fifth toe perhaps  over the PIP. I saw a picture of this on the daughter's cell phone. There was slough on this. Urgent care gave him doxycycline. She is also changed the dressing to all wounds back the [facility] and collagen which includes her right leg and left first toe 11/16; patient presents for follow-up. She has a new wound to the left knee. She states she fell. She has been using antibiotic ointment and collagen to this area. She has been using collagen and Keystone antibiotic to the right great toe wound. The anterior right leg wound is healed. She denies signs of infection. 11/30; patient presents for follow-up. The right great toe wound has healed. She has 1 remaining wound to the left knee. She has been using Hydrofera Blue and Medihoney here. 12/19; patient presents for follow-up. Her left knee wound has healed. She has no issues or complaints today. 09/30/2022 Patient's daughter called for an appointment due to increased swelling to her lower extremities bilaterally. Today patient presents with increased swelling to her lower extremities although there is no increased redness or warmth. She was evaluated by her nephrologist who increased her diuretics. Per patient and daughter her swelling has gone down to the leg Over the past several days. She does not have any open wounds. She was advised to elevate her legs and not consume excess salt By her PCP and nephrologist. Patient has compression stockings that she has been using sporadically. READMISSION 03/10/2023 Since her last visit to the clinic, the patient has been hospitalized at least twice, in February with COVID pneumonia and CHF exacerbation. She was discharged to a skilled nursing facility and then readmitted in April with fevers. She was noted to have pressure ulcers on her heels and sacrum. Per family declined skilled nursing placement upon discharge and she has apparently been residing at home. She has followed with the vascular surgery clinic  regarding peripheral artery disease and due to ulcerations on her feet, she ultimately underwent arteriography with balloon angioplasties of the superficial femoral artery, popliteal artery and peroneal artery on the left, along with shockwave ultrasound assisted balloon angioplasty of the popliteal artery and distal SFA. Given her multiple significant medical comorbidities, she is not a candidate for open surgery. She is deemed to be maximally vascularized this procedure, which was performed on 04 March 2023. T oday, she has a stage III pressure ulcer on her left heel and wounds on the PIP joints of her right third and left second toes. No sacral wound is present and the ulcer on her left heel has closed. 03/18/2023: She has a new ulcer on her left great toe. It was just noticed yesterday and the etiology is unknown. The fat layer is exposed and  there is some slough accumulation. The other wounds are all essentially unchanged in size. There is a little bit more granulation tissue on the other toe wounds, but bone is still frankly exposed on both sides. The heel ulcer has thick slough accumulation. 03/26/2023: She has a new wound on the tip of her left third toe. It is black. Neither the patient nor her daughter are aware of how it began. The other wounds are stable. 03/31/2023: The culture that I took from her left third toe returned with MRSA. We contacted the patient's daughter to have her stop levofloxacin and start doxycycline, but she has not yet picked up the new antibiotic. All of her wounds have accumulated slough. Bone remains exposed. She is scheduled to follow- up with vascular surgery tomorrow to evaluate the patency of her revascularization. 04/09/2023: She saw her vascular surgeon and her ABIs are improved; her TBI's could not be checked due to her bandages. She did finally start taking the prescribed doxycycline. All of her wounds look about the same today. 04/23/2023: No significant changes  to any of her wounds aside from being slightly smaller other than the left dorsal great toe wound, which is stable. She has accumulated slough and eschar on all of the surfaces. 04/30/2023: All of the wounds are stable with the exception of the left dorsal great toe wound which now has tendon exposed in addition to bone. There is slough and eschar accumulation on all sites. 05/15/2023: The patient was in the hospital and missed her appointment last week. The wound at the tip of her left third toe is healed. Everything else looks the same. 05/21/2023: No real change to any of the wounds. 05/28/2023: Once again, there has been no real change to any of her wounds except that the wound at the tip of her left great toe is closed. 06/11/2019: No change to any of her wounds. 06/25/2023: The wound on her right great toe nailbed has healed. The other wounds on her toes are unchanged and continue to have bone/joints exposed. The ulcer on her heel is smaller and shallower with very little necrotic tissue present. She fell last week and split her knee open. Apparently she presented to the ED greater than 12 hours after injury and they attempted to loosely suture the site. The sutures have pulled through her skin and they are doing absolutely nothing at this point. The wound is full of nonviable tissue. The muscle fascia is visible. Her PCP has her on doxycycline. 07/08/2023: The wounds on her feet are basically unchanged although the heel ulcer may be slightly smaller. The wound on her left knee looks much better this week. It is cleaner but still has a layer of slough on the surface. I can no longer see the muscle fascia. 07/22/2023: The heel ulcer is smaller and shallower. The left knee wound has also closed up considerably with just a small superficial open area that remains. The wounds on her toes are unchanged with persistent bone exposure. 07/29/2023: The knee wound is smaller again today. There is a little bit  of dry skin around the edges and thin slough on the surface. The other wounds are unchanged. 08/26/2023: The knee wound is nearly closed. It is very superficial with a clean surface. The wound on her heel is also smaller and shallower. The other wounds are stable. Electronic Signature(s) Signed: 08/26/2023 4:10:42 PM By: Duanne Guess MD FACS Entered By: Duanne Guess on 08/26/2023 12:40:37 Duffy, [name] ([id number]) [id number] Page  6 of 14 -------------------------------------------------------------------------------- Physical Exam Details Patient Name: Date of Service: ROKSANA, KILIAN 08/26/2023 2:45 PM Medical Record Number: [id number] Patient Account Number: [id number] Date of Birth/Sex: Treating RN: 08-Aug-1928 (89 y.o. F) Primary Care Provider: [name] Other Clinician: Referring Provider: Treating Provider/Extender: Claudie Leach Weeks in Treatment: 24 Notes 08/26/2023: The knee wound is nearly closed. It is very superficial with a clean surface. The wound on her heel is also smaller and shallower. The other wounds are stable. Electronic Signature(s) Signed: 08/26/2023 4:10:42 PM By: Duanne Guess MD FACS Entered By: Duanne Guess on 08/26/2023 12:45:33 -------------------------------------------------------------------------------- Physician Orders Details Patient Name: Date of Service: [name] 08/26/2023 2:45 PM Medical Record Number: [id number] Patient Account Number: [id number] Date of Birth/Sex: Treating RN: 03-06-1928 (89 y.o. 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 - Dr. Lady Gary Return A 1/2 24 @ 2:45 pm Anesthetic (In clinic) Topical Lidocaine 4% applied to wound bed Bathing/ Shower/ Hygiene May shower and wash wound with soap and water. Off-Loading Other: - float heels with pillows under calves Additional Orders / Instructions ANIYIA, RHODD ([id number]) [id number] Page 7 of 14 Follow Nutritious Diet - add in protein shakes every day to diet - recommend premier protein 500 mg x3 a day vitamin C, zinc 30-50 mg per day Home Health No change in wound care orders this week; continue Home Health for wound care. May utilize formulary equivalent dressing for wound treatment orders unless otherwise specified. Other Home Health Orders/Instructions: - Medi Home Wound Treatment Wound #10 - T Second oe Wound Laterality: Left Cleanser: Soap and Water 1 x Per Day/30 Days Discharge Instructions: May shower and wash wound with dial antibacterial soap and water prior to dressing change. Cleanser: Wound Cleanser 1 x Per Day/30 Days Discharge Instructions: Cleanse the wound with wound cleanser prior to applying a clean dressing using gauze sponges, not tissue or cotton balls. Peri-Wound Care: Sween Lotion (Moisturizing lotion) 1 x Per Day/30 Days Discharge Instructions: Apply moisturizing lotion as directed Prim Dressing: Promogran  Prisma Matrix, 4.34 (sq in) (silver collagen) (Dispense As Written) 1 x Per Day/30 Days ary Discharge Instructions: Moisten collagen with saline or hydrogel Secondary Dressing: Woven Gauze Sponge, Non-Sterile 4x4 in (Generic) 1 x Per Day/30 Days Discharge Instructions: Apply over primary dressing as directed. Secured With: [employer], 4.5x3.1 (in/yd) (Generic) 1 x Per Day/30 Days Discharge Instructions: Secure with Kerlix as directed. Secured With: 78M Medipore H Soft Cloth Surgical T ape, 4 x 10 (in/yd) (Generic) 1 x Per Day/30 Days Discharge Instructions: Secure with tape as directed. Wound #11 - T Second oe Wound Laterality: Right Cleanser: Soap and Water 1 x Per Day/30 Days Discharge Instructions: May shower and wash wound with dial antibacterial  soap and water prior to dressing change. Cleanser: Wound Cleanser 1 x Per Day/30 Days Discharge Instructions: Cleanse the wound with wound cleanser prior to applying a clean dressing using gauze sponges, not tissue or cotton balls. Prim Dressing: Promogran Prisma Matrix, 4.34 (sq in) (silver collagen) (Dispense As Written) 1 x Per Day/30 Days ary Discharge Instructions: Moisten collagen with saline or hydrogel Secondary Dressing: Woven Gauze Sponge, Non-Sterile 4x4 in (Generic) 1 x Per Day/30 Days Discharge Instructions: Apply over primary dressing as directed. Secured With: Insurance underwriter, Sterile 2x75 (in/in) (Generic) 1 x Per Day/30 Days Discharge Instructions: Secure with stretch gauze as directed. Secured With: 68M Medipore H Soft Cloth Surgical T ape, 4 x 10 (in/yd) (Generic) 1 x Per Day/30 Days Discharge Instructions: Secure with tape as directed. Wound #13 - T Great oe Wound Laterality: Dorsal, Left Cleanser: Soap and Water 1 x Per Day/30 Days Discharge Instructions: May shower and wash wound with dial antibacterial soap and water prior to dressing change. Cleanser: Wound Cleanser 1 x Per Day/30  Days Discharge Instructions: Cleanse the wound with wound cleanser prior to applying a clean dressing using gauze sponges, not tissue or cotton balls. Prim Dressing: Promogran Prisma Matrix, 4.34 (sq in) (silver collagen) (Dispense As Written) 1 x Per Day/30 Days ary Discharge Instructions: Moisten collagen with saline or hydrogel Secondary Dressing: Woven Gauze Sponge, Non-Sterile 4x4 in (Generic) 1 x Per Day/30 Days Discharge Instructions: Apply over primary dressing as directed. Secured With: 68M Medipore H Soft Cloth Surgical T ape, 4 x 10 (in/yd) (Generic) 1 x Per Day/30 Days Discharge Instructions: Secure with tape as directed. Wound #16 - Knee Wound Laterality: Left Cleanser: Normal Saline (Generic) 3 x Per Week/30 Days Discharge Instructions: Cleanse the wound with Normal Saline prior to applying a clean dressing using gauze sponges, not tissue or cotton balls. Prim Dressing: Hydrofera Blue Ready Transfer Foam, 4x5 (in/in) 3 x Per Week/30 Days ary Discharge Instructions: Apply to wound bed as instructed Secondary Dressing: Zetuvit Plus Silicone Border Dressing 5x5 (in/in) 3 x Per Week/30 Days Discharge Instructions: Apply silicone border over primary dressing as directed. ESTEFANITA, KULCZYK ([id number]) [id number] Page 8 of 14 Wound #9 - Calcaneus Wound Laterality: Left Cleanser: Soap and Water 1 x Per Day/30 Days Discharge Instructions: May shower and wash wound with dial antibacterial soap and water prior to dressing change. Cleanser: Wound Cleanser 1 x Per Day/30 Days Discharge Instructions: Cleanse the wound with wound cleanser prior to applying a clean dressing using gauze sponges, not tissue or cotton balls. Prim Dressing: Promogran Prisma Matrix, 4.34 (sq in) (silver collagen) (Dispense As Written) 1 x Per Day/30 Days ary Discharge Instructions: Moisten collagen with saline or hydrogel Secondary Dressing: ALLEVYN Heel 4 1/2in x 5 1/2in / 10.5cm x 13.5cm  (Generic) 1 x Per Day/30 Days Discharge Instructions: Apply over primary dressing as directed. Secondary Dressing: Woven Gauze Sponge, Non-Sterile 4x4 in (Generic) 1 x Per Day/30 Days Discharge Instructions: Apply over primary dressing as directed. Secured With: [employer], 4.5x3.1 (in/yd) (Generic) 1 x Per Day/30 Days Discharge Instructions: Secure with Kerlix as directed. Secured With: 68M Medipore H Soft Cloth Surgical T ape, 4 x 10 (in/yd) (Generic) 1 x Per Day/30 Days Discharge Instructions: Secure with tape as directed. Electronic Signature(s) Signed: 08/26/2023 4:10:42 PM By: Duanne Guess MD FACS Entered By: Duanne Guess on 08/26/2023 12:45:51 -------------------------------------------------------------------------------- Problem List Details Patient Name: Date of Service: [name] 08/26/2023 2:45 PM Medical Record Number: [id number] Patient Account  Number: [id number] Date of Birth/Sex: Treating RN: [birth year] (89 y.o. 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 03/10/2023 No Yes without evidence of necrosis 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 03/10/2023 No Yes without evidence of necrosis L97.825 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 06/25/2023 No Yes involvement without evidence of necrosis I73.9 Peripheral vascular disease, unspecified 03/10/2023 No Yes I50.32 Chronic diastolic (congestive) heart failure 03/10/2023 No Yes Daise, Mada M ([id number]) [id number] Page 9 of 14 N18.32 Chronic kidney  disease, stage 3b 03/10/2023 No Yes I87.2 Venous insufficiency (chronic) (peripheral) 03/10/2023 No Yes Inactive Problems ICD-10 Code Description Active Date Inactive Date L97.522 Non-pressure chronic ulcer of other part of left foot with fat layer exposed 03/18/2023 03/18/2023 Resolved Problems Electronic Signature(s) Signed: 08/26/2023 4:10:42 PM By: Duanne Guess MD FACS Entered By: Duanne Guess on 08/26/2023 12:39:24 -------------------------------------------------------------------------------- Progress Note Details Patient Name: Date of Service: [name] 08/26/2023 2:45 PM Medical Record Number: [id number] Patient Account Number: [id number] Date of Birth/Sex: Treating RN: [birth year] (89 y.o. F) Primary Care Provider: [name] Other Clinician: Referring Provider: Treating Provider/Extender: Claudie Leach Weeks in Treatment: 24 Subjective Chief Complaint Information obtained from Patient 01/28/2022; right second toe amputation site dehiscence and bilateral lower extremity wounds. 03/10/2023: pressure ulcers of right heel, ulcers on toes History of Present Illness (HPI) Admission 01/28/2022 Ms. 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 She states she has tried Medihoney and silver alginate in the past to this area with little benefit. She also has 2 small areas limited to skin breakdown to her lower extremities bilaterally. She has chronic venous insufficiency but not has not been wearing her compression stockings. She states she bumped her legs against an object and not so the wound started. She has been using Medihoney to the sites. She denies signs of infection. 6/2; patient  presents for follow-up. She had an x-ray of her right foot done at last clinic visit and this was negative for evidence of osteomyelitis. She also had a wound culture done that showed extra high levels of Staph aureus. I recommended Keystone antibiotics for this and this was ordered. She had ABIs with TBI's done as well that showed monophasic waveforms to the right foot with TBI of 0 and an ABI of 0.52. Urgent referral was made to vein and vascular and she saw Dr. Durwin Nora on 6/1, yesterday and he recommended an arteriogram. This is scheduled for 6/16. Patient also reports a new wound to the right great toe. This is a blister that has ruptured. She also reports increased erythema to the toe. 6/6; the patient was worked in urgently today at the insistence of her daughter out of concern for a new wound on the lateral part of the plantar right great toe. She has her original postsurgical wound after the amputation of the right second toe, she has a wound on the medial  part of the right great toe. The patient is apparently going for an angiogram by Dr. Durwin Nora in 2 weeks time. 6/13; patient presents for follow-up. She has been using bacitracin to the abrasion on the right great toe. She has been using collagen and Keystone antibiotic to the amputation site. She has no issues or complaints today. She denies signs of infection. 6/22; patient presents for follow-up. She states that her abdominal aortogram was canceled due to her renal function. She has been using Keystone antibiotics to the amputation site and Medihoney to the right great toe wound. At the pace of the right great toe she has a slitlike open area that she thinks was caused by the tape from the dressing. 6/29; patient presents for follow-up. She has been using Keystone antibiotics to the amputation site along with collagen. She has been using Medihoney to the right great toe wound. She has no other wounds. She denies signs of infection. 7/13;  patient presents for follow-up. She has been using Keystone antibiotics and collagen to the wound sites. She currently denies signs of infection. She states she is scheduled to see me nephrology next month. 7/20; patient presents for follow-up. She continue Keystone antibiotics and collagen to the wound sites. She has no issues or complaints today. ANAIKA, WIGGERS ([id number]) [id number] Page 10 of 14 8/1; patient presents for follow up. She continues to use keystone antibiotics and collagen to the wound sites. She has no issues or complaints today. 8/15; patient presents for follow-up. She has been using Keystone antibiotics and collagen to the wound sites. She followed up with her nephrologist who made medication changes. She is supposed to get a repeat BMP in 2 weeks. Her decrease in renal function was a limiting factor in obtaining an arteriogram for potential intervention for revascularization. She currently denies signs of infection. 9/2; patient presents for follow-up. She has been using Keystone antibiotic and collagen to the wound beds. She has no issues or complaints today. Reports there has been improvement in kidney function however not cleared to have her arteriogram just yet. 10/10; patient presents for follow-up. She has been using Keystone antibiotic and collagen to the wound beds. She reports 2 new wounds 1 to the anterior right lower extremity and another to the plantar aspect of the right foot. She states that the right plantar foot wound was caused by the home health nurse changing the dressing and causing a skin tear. She is not sure how the right anterior leg wound started. It appears to be from trauma. She denies signs of infection. 10/19; patient presents for follow-up. She has been using Keystone antibiotic and collagen to the right great toe wound. She is been using silver alginate to the right anterior and right plantar foot wound. She has been  using Tubigrip to the right lower extremity. The plantar foot wound has healed. She has no issues or complaints today. 11/3; since the patient was last here she was seen in urgent care apparently for an area on the dorsal aspect of the right fifth toe perhaps over the PIP. I saw a picture of this on the daughter's cell phone. There was slough on this. Urgent care gave him doxycycline. She is also changed the dressing to all wounds back the [facility] and collagen which includes her right leg and left first toe 11/16; patient presents for follow-up. She has a new wound to the left knee. She states she fell. She has been using antibiotic ointment and collagen to  this area. She has been using collagen and Keystone antibiotic to the right great toe wound. The anterior right leg wound is healed. She denies signs of infection. 11/30; patient presents for follow-up. The right great toe wound has healed. She has 1 remaining wound to the left knee. She has been using Hydrofera Blue and Medihoney here. 12/19; patient presents for follow-up. Her left knee wound has healed. She has no issues or complaints today. 09/30/2022 Patient's daughter called for an appointment due to increased swelling to her lower extremities bilaterally. Today patient presents with increased swelling to her lower extremities although there is no increased redness or warmth. She was evaluated by her nephrologist who increased her diuretics. Per patient and daughter her swelling has gone down to the leg Over the past several days. She does not have any open wounds. She was advised to elevate her legs and not consume excess salt By her PCP and nephrologist. Patient has compression stockings that she has been using sporadically. READMISSION 03/10/2023 Since her last visit to the clinic, the patient has been hospitalized at least twice, in February with COVID pneumonia and CHF exacerbation. She was discharged to a skilled nursing facility and  then readmitted in April with fevers. She was noted to have pressure ulcers on her heels and sacrum. Per family declined skilled nursing placement upon discharge and she has apparently been residing at home. She has followed with the vascular surgery clinic regarding peripheral artery disease and due to ulcerations on her feet, she ultimately underwent arteriography with balloon angioplasties of the superficial femoral artery, popliteal artery and peroneal artery on the left, along with shockwave ultrasound assisted balloon angioplasty of the popliteal artery and distal SFA. Given her multiple significant medical comorbidities, she is not a candidate for open surgery. She is deemed to be maximally vascularized this procedure, which was performed on 04 March 2023. T oday, she has a stage III pressure ulcer on her left heel and wounds on the PIP joints of her right third and left second toes. No sacral wound is present and the ulcer on her left heel has closed. 03/18/2023: She has a new ulcer on her left great toe. It was just noticed yesterday and the etiology is unknown. The fat layer is exposed and there is some slough accumulation. The other wounds are all essentially unchanged in size. There is a little bit more granulation tissue on the other toe wounds, but bone is still frankly exposed on both sides. The heel ulcer has thick slough accumulation. 03/26/2023: She has a new wound on the tip of her left third toe. It is black. Neither the patient nor her daughter are aware of how it began. The other wounds are stable. 03/31/2023: The culture that I took from her left third toe returned with MRSA. We contacted the patient's daughter to have her stop levofloxacin and start doxycycline, but she has not yet picked up the new antibiotic. All of her wounds have accumulated slough. Bone remains exposed. She is scheduled to follow- up with vascular surgery tomorrow to evaluate the patency of her  revascularization. 04/09/2023: She saw her vascular surgeon and her ABIs are improved; her TBI's could not be checked due to her bandages. She did finally start taking the prescribed doxycycline. All of her wounds look about the same today. 04/23/2023: No significant changes to any of her wounds aside from being slightly smaller other than the left dorsal great toe wound, which is stable. She has accumulated slough  and eschar on all of the surfaces. 04/30/2023: All of the wounds are stable with the exception of the left dorsal great toe wound which now has tendon exposed in addition to bone. There is slough and eschar accumulation on all sites. 05/15/2023: The patient was in the hospital and missed her appointment last week. The wound at the tip of her left third toe is healed. Everything else looks the same. 05/21/2023: No real change to any of the wounds. 05/28/2023: Once again, there has been no real change to any of her wounds except that the wound at the tip of her left great toe is closed. 06/11/2019: No change to any of her wounds. 06/25/2023: The wound on her right great toe nailbed has healed. The other wounds on her toes are unchanged and continue to have bone/joints exposed. The ulcer on her heel is smaller and shallower with very little necrotic tissue present. She fell last week and split her knee open. Apparently she presented to the ED greater than 12 hours after injury and they attempted to loosely suture the site. The sutures have pulled through her skin and they are doing absolutely nothing at this point. The wound is full of nonviable tissue. The muscle fascia is visible. Her PCP has her on doxycycline. 07/08/2023: The wounds on her feet are basically unchanged although the heel ulcer may be slightly smaller. The wound on her left knee looks much better this week. It is cleaner but still has a layer of slough on the surface. I can no longer see the muscle fascia. 07/22/2023: The heel  ulcer is smaller and shallower. The left knee wound has also closed up considerably with just a small superficial open area that remains. The wounds on her toes are unchanged with persistent bone exposure. 07/29/2023: The knee wound is smaller again today. There is a little bit of dry skin around the edges and thin slough on the surface. The other wounds are unchanged. 08/26/2023: The knee wound is nearly closed. It is very superficial with a clean surface. The wound on her heel is also smaller and shallower. The other wounds are stable. HALLEE, MOTA ([id number]) [id number] Page 11 of 14 Objective Constitutional Vitals Time Taken: 3:03 PM, Height: 64 in, Weight: 157 lbs, BMI: 26.9, Temperature: 98.3 F, Pulse: 77 bpm, Respiratory Rate: 18 breaths/min, Blood Pressure: 130/45 mmHg. Integumentary (Hair, Skin) Wound #10 status is Open. Original cause of wound was Pressure Injury. The date acquired was: 10/09/2022. The wound has been in treatment 24 weeks. The wound is located on the Left T Second. The wound measures 0.4cm length x 0.7cm width x 0.2cm depth; 0.22cm^2 area and 0.044cm^3 volume. There is oe bone, joint, and Fat Layer (Subcutaneous Tissue) exposed. There is no tunneling or undermining noted. There is a medium amount of serous drainage noted. The wound margin is distinct with the outline attached to the wound base. There is medium (34-66%) red, pink granulation within the wound bed. There is a small (1-33%) amount of necrotic tissue within the wound bed including Adherent Slough. The periwound skin appearance had no abnormalities noted for texture. The periwound skin appearance had no abnormalities noted for moisture. The periwound skin appearance had no abnormalities noted for color. Periwound temperature was noted as No Abnormality. The periwound has tenderness on palpation. Wound #11 status is Open. Original cause of wound was Pressure Injury. The date  acquired was: 10/09/2022. The wound has been in treatment 24 weeks. The wound is located on the  Right T Second. The wound measures 0.4cm length x 0.7cm width x 0.4cm depth; 0.22cm^2 area and 0.088cm^3 volume. There is oe bone, joint, and Fat Layer (Subcutaneous Tissue) exposed. There is no tunneling or undermining noted. There is a medium amount of serous drainage noted. The wound margin is distinct with the outline attached to the wound base. There is medium (34-66%) red, pink granulation within the wound bed. There is a small (1-33%) amount of necrotic tissue within the wound bed including Adherent Slough. The periwound skin appearance had no abnormalities noted for texture. The periwound skin appearance had no abnormalities noted for moisture. The periwound skin appearance had no abnormalities noted for color. Periwound temperature was noted as No Abnormality. The periwound has tenderness on palpation. Wound #13 status is Open. Original cause of wound was Gradually Appeared. The date acquired was: 03/24/2023. The wound has been in treatment 21 weeks. The wound is located on the Left,Dorsal T Great. The wound measures 1cm length x 1.6cm width x 0.2cm depth; 1.257cm^2 area and 0.251cm^3 volume. oe There is bone and Fat Layer (Subcutaneous Tissue) exposed. There is no tunneling or undermining noted. There is a medium amount of serous drainage noted. The wound margin is distinct with the outline attached to the wound base. There is small (1-33%) red, pink granulation within the wound bed. There is a large (67- 100%) amount of necrotic tissue within the wound bed including Adherent Slough and Necrosis of Bone. The periwound skin appearance had no abnormalities noted for texture. The periwound skin appearance had no abnormalities noted for moisture. The periwound skin appearance had no abnormalities noted for color. Periwound temperature was noted as No Abnormality. The periwound has tenderness on  palpation. Wound #16 status is Open. Original cause of wound was Trauma. The date acquired was: 06/18/2023. The wound has been in treatment 8 weeks. The wound is located on the Left Knee. The wound measures 0.2cm length x 0.2cm width x 0.1cm depth; 0.031cm^2 area and 0.003cm^3 volume. There is Fat Layer (Subcutaneous Tissue) exposed. There is no tunneling or undermining noted. There is a small amount of serosanguineous drainage noted. The wound margin is distinct with the outline attached to the wound base. There is large (67-100%) red granulation within the wound bed. There is no necrotic tissue within the wound bed. The periwound skin appearance had no abnormalities noted for texture. The periwound skin appearance had no abnormalities noted for moisture. The periwound skin appearance had no abnormalities noted for color. Periwound temperature was noted as No Abnormality. The periwound has tenderness on palpation. Wound #9 status is Open. Original cause of wound was Pressure Injury. The date acquired was: 10/22/2022. The wound has been in treatment 24 weeks. The wound is located on the Left Calcaneus. The wound measures 0.3cm length x 0.3cm width x 0.3cm depth; 0.071cm^2 area and 0.021cm^3 volume. There is Fat Layer (Subcutaneous Tissue) exposed. There is no tunneling or undermining noted. There is a small amount of serous drainage noted. The wound margin is distinct with the outline attached to the wound base. There is no granulation within the wound bed. There is a large (67-100%) amount of necrotic tissue within the wound bed including Adherent Slough. The periwound skin appearance had no abnormalities noted for moisture. The periwound skin appearance had no abnormalities noted for color. The periwound skin appearance exhibited: Callus. Periwound temperature was noted as No Abnormality. The periwound has tenderness on palpation. 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 There was a Selective/Open Wound Skin/Epidermis Debridement oe with a total area of 0.22 sq cm performed by Duanne Guess, MD. With the following instrument(s): Curette to remove Non-Viable tissue/material. Material removed includes Callus and Skin: Epidermis and after achieving pain control using Lidocaine 4% T opical Solution. No specimens were taken. A time out was conducted at 15:30, prior to the start of the procedure. There was no bleeding. The procedure was tolerated well with a pain level of 2 throughout and a pain level of 1 following the procedure. Post Debridement Measurements: 0.4cm length x 0.7cm width x 0.2cm depth; 0.044cm^3 volume. Post debridement Stage noted as Category/Stage IV. Character of Wound/Ulcer Post Debridement is stable. LOUVELLA, BENTHAM ([id number]) [id number] Page 12 of 14 Post procedure Diagnosis Wound #10: Same as Pre-Procedure Wound #11 Pre-procedure diagnosis of Wound #11 is a Pressure Ulcer located on the Right T Second . There was a Excisional Skin/Subcutaneous Tissue Debridement oe with a total area of 0.22 sq cm performed by Duanne Guess, MD. With the following instrument(s): Curette to remove Viable and Non-Viable tissue/material. Material removed includes Subcutaneous Tissue, Slough, and Skin: Epidermis after achieving pain  control using Lidocaine 4% T opical Solution. No specimens were taken. A time out was conducted at 15:30, prior to the start of the procedure. There was no bleeding. The procedure was tolerated well with a pain level of 2 throughout and a pain level of 1 following the procedure. Post Debridement Measurements: 0.4cm length x 0.7cm width x 0.4cm depth; 0.088cm^3 volume. Post debridement Stage noted as Category/Stage IV. Character of Wound/Ulcer Post Debridement is stable. 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 There was a Excisional Skin/Subcutaneous Tissue Debridement with a total area of 1.26 sq cm performed by Duanne Guess, MD. With the following instrument(s): Curette to remove Viable and Non-Viable tissue/material. Material removed includes Subcutaneous Tissue and Slough and after achieving pain control using Lidocaine 4% T opical Solution. No specimens were taken. A time out was conducted at 15:30, prior to the start of the procedure. There was no bleeding. The procedure was tolerated well with a pain level of 2 throughout and a pain level of 1 following the procedure. Post Debridement Measurements: 1cm length x 1.6cm width x 0.2cm depth; 0.251cm^3 volume. Character of Wound/Ulcer Post Debridement is stable. Severity of Tissue Post Debridement is: Necrosis of bone. Post procedure Diagnosis Wound #13: Same as Pre-Procedure Wound #9 Pre-procedure diagnosis of Wound #9 is a Pressure Ulcer located on the Left Calcaneus . There was a Excisional Skin/Subcutaneous Tissue Debridement with a total area of 0.07 sq cm performed by Duanne Guess, MD. With the following instrument(s): Curette to remove Viable and Non-Viable tissue/material. Material removed includes Eschar, Subcutaneous Tissue, Slough, and  Skin: Epidermis after achieving pain control using Lidocaine 4% T opical Solution. No specimens were taken. A  time out was conducted at 15:30, prior to the start of the procedure. There was no bleeding. The procedure was tolerated well with a pain level of 3 throughout and a pain level of 1 following the procedure. Post Debridement Measurements: 0.3cm length x 0.3cm width x 0.3cm depth; 0.021cm^3 volume. Post debridement Stage noted as Category/Stage III. Character of Wound/Ulcer Post Debridement is stable. Post procedure Diagnosis Wound #9: Same as Pre-Procedure Plan Follow-up Appointments: Return Appointment in 2 weeks. - Dr. Lady Gary 1/2 24 @ 2:45 pm Anesthetic: (In clinic) Topical Lidocaine 4% applied to wound bed Bathing/ Shower/ Hygiene: May shower and wash wound with soap and water. Off-Loading: Other: - float heels with pillows under calves Additional Orders / Instructions: Follow Nutritious Diet - add in protein shakes every day to diet - recommend premier protein 500 mg x3 a day vitamin C, zinc 30-50 mg per day Home Health: No change in wound care orders this week; continue Home Health for wound care. May utilize formulary equivalent dressing for wound treatment orders unless otherwise specified. Other Home Health Orders/Instructions: - Medi Home WOUND #10: - T Second Wound Laterality: Left oe Cleanser: Soap and Water 1 x Per Day/30 Days Discharge Instructions: May shower and wash wound with dial antibacterial soap and water prior to dressing change. Cleanser: Wound Cleanser 1 x Per Day/30 Days Discharge Instructions: Cleanse the wound with wound cleanser prior to applying a clean dressing using gauze sponges, not tissue or cotton balls. Peri-Wound Care: Sween Lotion (Moisturizing lotion) 1 x Per Day/30 Days Discharge Instructions: Apply moisturizing lotion as directed Prim Dressing: Promogran Prisma Matrix, 4.34 (sq in) (silver collagen) (Dispense As Written) 1 x Per Day/30  Days ary Discharge Instructions: Moisten collagen with saline or hydrogel Secondary Dressing: Woven Gauze Sponge, Non-Sterile 4x4 in (Generic) 1 x Per Day/30 Days Discharge Instructions: Apply over primary dressing as directed. Secured With: [employer], 4.5x3.1 (in/yd) (Generic) 1 x Per Day/30 Days Discharge Instructions: Secure with Kerlix as directed. Secured With: 10M Medipore H Soft Cloth Surgical T ape, 4 x 10 (in/yd) (Generic) 1 x Per Day/30 Days Discharge Instructions: Secure with tape as directed. WOUND #11: - T Second Wound Laterality: Right oe Cleanser: Soap and Water 1 x Per Day/30 Days Discharge Instructions: May shower and wash wound with dial antibacterial soap and water prior to dressing change. Cleanser: Wound Cleanser 1 x Per Day/30 Days Discharge Instructions: Cleanse the wound with wound cleanser prior to applying a clean dressing using gauze sponges, not tissue or cotton balls. Prim Dressing: Promogran Prisma Matrix, 4.34 (sq in) (silver collagen) (Dispense As Written) 1 x Per Day/30 Days ary Discharge Instructions: Moisten collagen with saline or hydrogel Secondary Dressing: Woven Gauze Sponge, Non-Sterile 4x4 in (Generic) 1 x Per Day/30 Days Discharge Instructions: Apply over primary dressing as directed. Secured With: Insurance underwriter, Sterile 2x75 (in/in) (Generic) 1 x Per Day/30 Days Discharge Instructions: Secure with stretch gauze as directed. Secured With: 10M Medipore H Soft Cloth Surgical T ape, 4 x 10 (in/yd) (Generic) 1 x Per Day/30 Days Discharge Instructions: Secure with tape as directed. WOUND #13: - T Great Wound Laterality: Dorsal, Left oe Cleanser: Soap and Water 1 x Per Day/30 Days Discharge Instructions: May shower and wash wound with dial antibacterial soap and water prior to dressing change. Cleanser: Wound Cleanser 1 x Per Day/30 Days Discharge Instructions: Cleanse the wound with wound cleanser prior to applying a clean  dressing using gauze sponges, not tissue or cotton balls. Prim Dressing:  Promogran Prisma Matrix, 4.34 (sq in) (silver collagen) (Dispense As Written) 1 x Per Day/30 Days ary Discharge Instructions: Moisten collagen with saline or hydrogel Sponsel, Elishia M ([id number]) [id number].pdf Page 13 of 14 Secondary Dressing: Woven Gauze Sponge, Non-Sterile 4x4 in (Generic) 1 x Per Day/30 Days Discharge Instructions: Apply over primary dressing as directed. Secured With: 45M Medipore H Soft Cloth Surgical T ape, 4 x 10 (in/yd) (Generic) 1 x Per Day/30 Days Discharge Instructions: Secure with tape as directed. WOUND #16: - Knee Wound Laterality: Left Cleanser: Normal Saline (Generic) 3 x Per Week/30 Days Discharge Instructions: Cleanse the wound with Normal Saline prior to applying a clean dressing using gauze sponges, not tissue or cotton balls. Prim Dressing: Hydrofera Blue Ready Transfer Foam, 4x5 (in/in) 3 x Per Week/30 Days ary Discharge Instructions: Apply to wound bed as instructed Secondary Dressing: Zetuvit Plus Silicone Border Dressing 5x5 (in/in) 3 x Per Week/30 Days Discharge Instructions: Apply silicone border over primary dressing as directed. WOUND #9: - Calcaneus Wound Laterality: Left Cleanser: Soap and Water 1 x Per Day/30 Days Discharge Instructions: May shower and wash wound with dial antibacterial soap and water prior to dressing change. Cleanser: Wound Cleanser 1 x Per Day/30 Days Discharge Instructions: Cleanse the wound with wound cleanser prior to applying a clean dressing using gauze sponges, not tissue or cotton balls. Prim Dressing: Promogran Prisma Matrix, 4.34 (sq in) (silver collagen) (Dispense As Written) 1 x Per Day/30 Days ary Discharge Instructions: Moisten collagen with saline or hydrogel Secondary Dressing: ALLEVYN Heel 4 1/2in x 5 1/2in / 10.5cm x 13.5cm (Generic) 1 x Per Day/30 Days Discharge Instructions: Apply over primary dressing as  directed. Secondary Dressing: Woven Gauze Sponge, Non-Sterile 4x4 in (Generic) 1 x Per Day/30 Days Discharge Instructions: Apply over primary dressing as directed. Secured With: [employer], 4.5x3.1 (in/yd) (Generic) 1 x Per Day/30 Days Discharge Instructions: Secure with Kerlix as directed. Secured With: 45M Medipore H Soft Cloth Surgical T ape, 4 x 10 (in/yd) (Generic) 1 x Per Day/30 Days Discharge Instructions: Secure with tape as directed. 08/26/2023: The knee wound is nearly closed. It is very superficial with a clean surface. The wound on her heel is also smaller and shallower. The other wounds are stable. The knee did not require any debridement. I think we can just put a Band-Aid over this site. I debrided eschar, slough, and subcutaneous tissue from the heel, callus and slough from the left second toe, slough and subcutaneous tissue from the left great toe and callus, slough, and subcutaneous tissue from the right second toe. Continue Prisma silver collagen to all of the foot wounds. Follow-up in 2 weeks. Electronic Signature(s) Signed: 08/26/2023 5:35:56 PM By: Shawn Stall RN, BSN Signed: 08/27/2023 8:41:18 AM By: Duanne Guess MD FACS Previous Signature: 08/26/2023 4:10:42 PM Version By: Duanne Guess MD FACS Entered By: Shawn Stall on 08/26/2023 14:32:09 -------------------------------------------------------------------------------- SuperBill Details Patient Name: Date of Service: [name] 08/26/2023 Medical Record Number: [id number] Patient Account Number: [id number] Date of Birth/Sex: Treating RN: 12/19/1927 (89 y.o. 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  without evidence of necrosis 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 without evidence of necrosis L97.825 Non-pressure chronic ulcer of other part of left  lower leg with muscle involvement without evidence of necrosis I73.9 Peripheral vascular disease, unspecified I50.32 Chronic diastolic (congestive) heart failure N18.32 Chronic kidney disease, stage 3b I87.2 Venous insufficiency (chronic) (peripheral) Facility Procedures : Knoles, HAT CPT4 Code: [id number] TIE M ([id number] IC Description: 11042 - DEB SUBQ TISSUE 20 SQ CM/< 8) [phone] D-10 Diagnosis Description L89.623 Pressure ulcer of left heel, 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 without e L97.526  Non-pressure chronic ulcer of other part of left foot with bone involvement without ev Modifier: [id number] vidence of necrosis idence of necrosis Quantity: 1 Page 14 of 14 : CPT4 Code: [id number] 97 I Description: 597 - DEBRIDE WOUND 1ST 20 SQ CM OR < CD-10 Diagnosis Description 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 without ev Modifier: idence of necrosis Quantity: 1 Physician Procedures : CPT4 Code Description Modifier [id number] 11042 - WC PHYS SUBQ TISS 20 SQ CM ICD-10 Diagnosis Description L89.623 Pressure ulcer of left heel, 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

## 2023-08-27 NOTE — Assessment & Plan Note (Addendum)
Patient creatinine 1.34.  Baseline creatinineSeems to be 1.02-1.07.  At this time clinically I feel the patient is fluid overloaded on her legs actually.  Patient has received some sodium chloride infusion in the ER.  I will hold off on further infusion.  I will check urinalysis sodium and creatinine. I will get [place] KUB

## 2023-08-27 NOTE — Progress Notes (Signed)
PHARMACY NOTE:  ANTIMICROBIAL RENAL DOSAGE ADJUSTMENT  Current antimicrobial regimen includes a mismatch between antimicrobial dosage and estimated renal function.  As per policy approved by the Pharmacy & Therapeutics and Medical Executive Committees, the antimicrobial dosage will be adjusted accordingly.  Current antimicrobial dosage:  Cipro 250mg  po BID x 5 doses  Indication: UTI  Renal Function:  Estimated Creatinine Clearance: 24.6 mL/min (A) (by C-G formula based on SCr of 1.34 mg/dL (H)).      Antimicrobial dosage has been changed to:  Cipro 250mg  po q24h x 3 days  Additional comments:   Thank you for allowing pharmacy to be a part of this patient's care.  Maryellen Pile, [facility] 08/27/2023 10:07 PM

## 2023-08-27 NOTE — Assessment & Plan Note (Signed)
Nightly this is only mild.  Per report patient had worse hyponatremia a few days ago and was treated with oral sodium chloride tablets.  Unfortunately patient has had worsening leg edema since the last 7 days or so due to her sodium chloride treatment.  At this time I will stop sodium chloride infusion.  We will monitor clinically.  I will check serum osmolality cortisol and TSH.

## 2023-08-27 NOTE — ED Provider Triage Note (Signed)
Emergency Medicine Provider Triage Evaluation Note  AKSHITHA HUPPE , a 89 y.o. female  was evaluated in triage.  Pt complains of abnormal labs.Told by PCP Hgb was 7.1. States hx of needing transfusions previous. Denies any symptoms. No melena. Has occasional nose bleeding due to chronic oxygen. Family states patient slide out of her chair about a week ago. Has bruising to BLE.Denies head injury. On chronic oxygen.   Review of Systems  Positive: Abnormal lab Negative:   Physical Exam  BP (!) 159/78   Pulse 80   Temp 98.1 F (36.7 C) (Oral)   Resp 19   Ht 5\' 3"  (1.6 m)   Wt 70 kg   SpO2 98%   BMI 27.34 kg/m  Gen:   Awake, no distress   Resp:  Normal effort  MSK:   LE edema, bruising  Other:    Medical Decision Making  Medically screening exam initiated at 5:53 PM.  Appropriate orders placed.  JAIMEE TYRER was informed that the remainder of the evaluation will be completed by another provider, this initial triage assessment does not replace that evaluation, and the importance of remaining in the ED until their evaluation is complete.  Abnormal lab   Aristotle Lieb A, PA-C 08/27/23 1756

## 2023-08-27 NOTE — ED Triage Notes (Signed)
Patient was called that her hemoglobin was 7.1. History of getting blood transfusions. Denies bleeding from anywhere. Does not feel fatigued.

## 2023-08-27 NOTE — Assessment & Plan Note (Addendum)
This is rather incidentally found.  Patient has had several sites of blood loss including bruising in legs due to her fall, recent nare bleed.  At this time there is no evidence of any exsanguinating GI bleed.  Although occult blood is positive.  Discussed with the patient and daughter and they would not want a GI referral for potential colonoscopy or endoscopy for this.  We will get the patient's INR back into therapeutic range (patient ordred for 2.5 mg vit k in the ER).  I will send a full anemia workup to rule out any contributing causes.  And proceed from there.  Curenlty not on transfusion threshold. Empiric pantop has been started  If no bleeding next 12-24 hours, consider restarting warfarin to therapeutic target.

## 2023-08-27 NOTE — Assessment & Plan Note (Signed)
Patient was noted to have pressure injury stage II of the coccygeal area.  Further daughter shows pressure injury photo of left heel that is currently dressed.  We will request wound care evaluation.  Turn and position patient.

## 2023-08-27 NOTE — Assessment & Plan Note (Signed)
This was diagnosed prior to admison. Aptient initally treated with doxycylcine. Subsequently changed to ciprofloxacin per daughter. C.w. same at this time. No concern for uncontrooled UTI at this time.

## 2023-08-27 NOTE — H&P (Addendum)
History and Physical    Patient: [name] ZOX:[id number] DOB: 23-Aug-1928 DOA: 08/27/2023 DOS: the patient was seen and examined on 08/27/2023 PCP: Camie Patience, FNP  Patient coming from: Home sent in by PCP  Chief Complaint:  Chief Complaint  Patient presents with   Abnormal Lab   HPI: [name] is a 89 y.o. female with medical history significant of chronic use of 2 L/min of supplementary oxygen.  Patient has also had recurrent pulmonary embolism.  The last episode was more than 10 years ago.  Patient is chronically anticoagulated with warfarin.  Patient is alert and awake and partakes in the history however principally the history is provided by the daughter at the bedside.  Patient tends to have falls at home has had about 6 falls this year that the daughter can count.  However there are instances when the patient is by herself at home.  There is concern that the patient try and tends to minimize her symptoms.  Patient has had various bruising develop on her lower extremity that have been attributed to her falls.  Patient also had self-limiting episode of left nare bleed a few days ago.  Patient denies any vomiting denies any diarrhea.  No abdominal pain.  Patient was diagnosed with a urinary tract infection about 3 to 4 days ago.  Started treatment with doxycycline subsequently changed to ciprofloxacin.  Patient currently reports no dysuria no fevers no rigors.  Patient also was noted to have worse hyponatremia than her baseline and given a 5-day course of sodium chloride tablets.  Patient had workup done at primary care office yesterday with finding of hemoglobin of 7.1.  Patient referred to the ER for workup today.  No history of chest pain, new shortness of breath presyncope.  Patient does have chronic shortness of breath.   Review of Systems: As mentioned in the history of present illness. All other systems reviewed and are negative. Past Medical History:  Diagnosis Date    Anticoagulant long-term use    Arrhythmia    Arthritis    Atrial fibrillation (HCC)    Congestive heart failure (CHF) (HCC)    Dysphagia    Esophageal reflux    Essential hypertension    Hiatal hernia    Pulmonary embolus (HCC)    Thyroid disease    Past Surgical History:  Procedure Laterality Date   ABDOMINAL AORTOGRAM W/LOWER EXTREMITY N/A 03/04/2023   Procedure: ABDOMINAL AORTOGRAM W/LOWER EXTREMITY;  Surgeon: [name], MD;  Location: [facility] INVASIVE CV LAB;  Service: Cardiovascular;  Laterality: N/A;   AMPUTATION TOE Right 11/17/2021   Procedure: RIGHT SECOND TOE AMPUTATION;  Surgeon: [name], MD;  Location: [facility] ORS;  Service: Orthopedics;  Laterality: Right;   COLONOSCOPY     ELBOW SURGERY     LAPAROSCOPIC HYSTERECTOMY     Social History:  reports that she quit smoking about 21 years ago. Her smoking use included cigarettes. She has never used smokeless tobacco. She reports that she does not drink alcohol and does not use drugs.  Allergies  Allergen Reactions   Amoxicillin Other (See Comments)    Unknown reaction  Tolerates Keflex, Cefepime   Azithromycin Other (See Comments)    Unknown reaction    Codeine Other (See Comments)    Unknown reaction  Other Reaction(s): Not available, Not available, Not available, Not available, Not available, Not available, Not available   Erythromycin Other (See Comments)    Unknown reaction    Green Dyes  Other (See Comments)    Allergic to ALL dyes   Iodine Other (See Comments)    Unknown reaction    Misc. Sulfonamide Containing Compounds    Oxycodone Other (See Comments)    Unknown reaction      Oxycodone-Acetaminophen Other (See Comments)    Unknown reaction    Penicillins Other (See Comments)    Unknown reaction  Tolerates Keflex, Cefepime   Sulfa Antibiotics     Per Pt's daughter - unknown reaction    Sulfasalazine     Unsure of allergy    Tramadol Hives    Family History  Problem Relation Age of Onset    Stroke Mother    Cancer Brother     Prior to Admission medications   Medication Sig Start Date End Date Taking? Authorizing Provider  acetaminophen (TYLENOL) 325 MG tablet Take 2 tablets (650 mg total) by mouth every 6 (six) hours as needed for mild pain or headache. Patient taking differently: Take 650 mg by mouth every 6 (six) hours as needed for mild pain (pain score 1-3), headache or fever. 11/06/22   Almon Hercules, MD  ascorbic acid (VITAMIN C) 500 MG tablet Take 500 mg by mouth daily.    [provider]  aspirin EC 81 MG tablet Take 1 tablet (81 mg total) by mouth daily. Swallow whole. 03/04/23 03/03/24  [name], MD  atorvastatin (LIPITOR) 10 MG tablet Take 1 tablet (10 mg total) by mouth daily. 03/04/23 03/03/24  [name], MD  bumetanide (BUMEX) 0.5 MG tablet Take 0.5 mg by mouth 2 (two) times daily.    [provider]  carboxymethylcellulose (REFRESH PLUS) 0.5 % SOLN Place 1 drop into both eyes 2 (two) times daily.    [provider]  ciprofloxacin (CIPRO) 250 MG tablet Take 250 mg by mouth 2 (two) times daily. 06/25/23   [provider]  diclofenac Sodium (VOLTAREN) 1 % GEL Apply 4 g topically 4 (four) times daily. To bilateral feet and knees Patient taking differently: Apply 4 g topically 4 (four) times daily as needed (pain). To bilateral feet and knees 11/06/22   Candelaria Stagers T, MD  estradiol (ESTRACE) 0.1 MG/GM vaginal cream Place 1 Applicatorful vaginally 3 (three) times a week. 04/21/23   [provider]  gabapentin (NEURONTIN) 300 MG capsule Take 300 mg by mouth 2 (two) times daily.     [provider]  levothyroxine (SYNTHROID) 100 MCG tablet Take 100 mcg by mouth daily before breakfast. 09/05/21   [provider]  Multiple Vitamins-Minerals (PRESERVISION/LUTEIN) CAPS Take 1 capsule by mouth 2 (two) times daily.     [provider]  ondansetron (ZOFRAN) 4 MG tablet Take 4 mg by mouth daily as needed.  04/17/23   [provider]  OVER THE COUNTER MEDICATION Take 1 capsule by mouth at bedtime. Equate Neuriva    [provider]  pantoprazole (PROTONIX) 40 MG tablet Take 40 mg by mouth daily.    [provider]  senna-docusate (SENOKOT-S) 8.6-50 MG tablet Take 1 tablet by mouth 2 (two) times daily between meals as needed for mild constipation. Patient taking differently: Take 1 tablet by mouth at bedtime. 11/06/22   Almon Hercules, MD  spironolactone (ALDACTONE) 25 MG tablet Take 12.5 mg by mouth daily. 06/19/23   [provider]  vitamin B-12 (CYANOCOBALAMIN) 1000 MCG tablet Take 1,000 mcg by mouth daily.    [provider]  warfarin (COUMADIN) 1 MG tablet Take 1-2  mg by mouth as directed. 1 MG (Mon,Wed,Fri) & 2 MG on (Tues,Thurs,Sat,Sun)    [provider]  zinc gluconate 50 MG tablet Take 50 mg by mouth daily.    [provider]    Physical Exam: Vitals:   08/27/23 1751 08/27/23 1752 08/27/23 2000  BP: (!) 159/78 (!) 159/78 (!) 134/58  Pulse: 80  66  Resp: 19  (!) 23  Temp: 98.1 F (36.7 C)    TempSrc: Oral    SpO2: 98%  100%  Weight: 70 kg    Height: 5\' 3"  (1.6 m)    General: Thin appearing lady, daughter at the bedside.  Patient is alert and awake and able to give a coherent account of her symptoms.  However defers a lot of interaction to her daughter.  Patient does not appear immediately distressed on is on 2 L/min of supplementary oxygen.  Patient has left nare tissue in place.  No blood staining. Respiratory exam: Bilateral intravesicular Cardiovascular exam S1-S2 normal systolic murmur Abdomen: Bowel sounds normal all quadrants soft nontender Extremities 1-2+ edema. Patient has a pressure injury of her presacral area.  This was inspected with RN Lillia Abed.  Most of the pressure injury stage I nonblanching erythema however there is a central area of skin disruption.  There were dark green stool around the rectum seen.  There  was no melena noted.  I did not do a rectal exam per se.  Rectal exam was performed by ER attending which was verbally reported to me as nonmelanotic stool however occult blood is positive. Data Reviewed:  Labs on Admission:  Results for orders placed or performed during the hospital encounter of 08/27/23 (from the past 24 hours)  CBC with Differential     Status: Abnormal   Collection Time: 08/27/23  6:06 PM  Result Value Ref Range   WBC 7.8 4.0 - 10.5 K/uL   RBC 2.68 (L) 3.87 - 5.11 MIL/uL   Hemoglobin 7.9 (L) 12.0 - 15.0 g/dL   HCT 58.5 (L) 27.7 - 82.4 %   MCV 93.7 80.0 - 100.0 fL   MCH 29.5 26.0 - 34.0 pg   MCHC 31.5 30.0 - 36.0 g/dL   RDW 23.5 (H) 36.1 - 44.3 %   Platelets 291 150 - 400 K/uL   nRBC 0.0 0.0 - 0.2 %   Neutrophils Relative % 71 %   Neutro Abs 5.5 1.7 - 7.7 K/uL   Lymphocytes Relative 13 %   Lymphs Abs 1.0 0.7 - 4.0 K/uL   Monocytes Relative 10 %   Monocytes Absolute 0.8 0.1 - 1.0 K/uL   Eosinophils Relative 4 %   Eosinophils Absolute 0.3 0.0 - 0.5 K/uL   Basophils Relative 1 %   Basophils Absolute 0.1 0.0 - 0.1 K/uL   Immature Granulocytes 1 %   Abs Immature Granulocytes 0.06 0.00 - 0.07 K/uL  Comprehensive metabolic panel     Status: Abnormal   Collection Time: 08/27/23  6:06 PM  Result Value Ref Range   Sodium 132 (L) 135 - 145 mmol/L   Potassium 3.3 (L) 3.5 - 5.1 mmol/L   Chloride 94 (L) 98 - 111 mmol/L   CO2 28 22 - 32 mmol/L   Glucose, Bld 109 (H) 70 - 99 mg/dL   BUN 19 8 - 23 mg/dL   Creatinine, Ser 1.54 (H) 0.44 - 1.00 mg/dL   Calcium 9.2 8.9 - 00.8 mg/dL   Total Protein 7.3 6.5 - 8.1 g/dL   Albumin 3.0 (L)  3.5 - 5.0 g/dL   AST 30 15 - 41 U/L   ALT 16 0 - 44 U/L   Alkaline Phosphatase 135 (H) 38 - 126 U/L   Total Bilirubin 0.8 <1.2 mg/dL   GFR, Estimated 37 (L) >60 mL/min   Anion gap 10 5 - 15  Type and screen [facility]     Status: None   Collection Time: 08/27/23  6:06 PM  Result Value Ref Range   ABO/RH(D) A POS     Antibody Screen NEG    Sample Expiration      08/30/2023,2359 Performed at [facility], [address] [place], [place] [address]   Protime-INR     Status: Abnormal   Collection Time: 08/27/23  6:06 PM  Result Value Ref Range   Prothrombin Time 41.0 (H) 11.4 - 15.2 seconds   INR 4.2 (HH) 0.8 - 1.2  Brain natriuretic peptide     Status: Abnormal   Collection Time: 08/27/23  6:06 PM  Result Value Ref Range   B Natriuretic Peptide 264.7 (H) 0.0 - 100.0 pg/mL  POC occult blood, ED Provider will collect     Status: Abnormal   Collection Time: 08/27/23  7:58 PM  Result Value Ref Range   Fecal Occult Bld POSITIVE (A) NEGATIVE   Basic Metabolic Panel: Recent Labs  Lab 08/27/23 1806  NA 132*  K 3.3*  CL 94*  CO2 28  GLUCOSE 109*  BUN 19  CREATININE 1.34*  CALCIUM 9.2   Liver Function Tests: Recent Labs  Lab 08/27/23 1806  AST 30  ALT 16  ALKPHOS 135*  BILITOT 0.8  PROT 7.3  ALBUMIN 3.0*   No results for input(s): "LIPASE", "AMYLASE" in the last 168 hours. No results for input(s): "AMMONIA" in the last 168 hours. CBC: Recent Labs  Lab 08/27/23 1806  WBC 7.8  NEUTROABS 5.5  HGB 7.9*  HCT 25.1*  MCV 93.7  PLT 291   Cardiac Enzymes: No results for input(s): "CKTOTAL", "CKMB", "CKMBINDEX", "TROPONINIHS" in the last 168 hours.  BNP (last 3 results) No results for input(s): "PROBNP" in the last 8760 hours. CBG: No results for input(s): "GLUCAP" in the last 168 hours.  Radiological Exams on Admission:  No results found.    Assessment and Plan: * Anemia This is rather incidentally found.  Patient has had several sites of blood loss including bruising in legs due to her fall, recent nare bleed.  At this time there is no evidence of any exsanguinating GI bleed.  Although occult blood is positive.  Discussed with the patient and daughter and they would not want a GI referral for potential colonoscopy or endoscopy for this.  We will get the  patient's INR back into therapeutic range (patient ordred for 2.5 mg vit k in the ER).  I will send a full anemia workup to rule out any contributing causes.  And proceed from there.  Curenlty not on transfusion threshold. Empiric pantop has been started  If no bleeding next 12-24 hours, consider restarting warfarin to therapeutic target.  UTI (urinary tract infection) This was diagnosed prior to admison. Aptient initally treated with doxycylcine. Subsequently changed to ciprofloxacin per daughter. C.w. same at this time. No concern for uncontrooled UTI at this time.  Hyponatremia Nightly this is only mild.  Per report patient had worse hyponatremia a few days ago and was treated with oral sodium chloride tablets.  Unfortunately patient has had worsening leg edema since the last  7 days or so due to her sodium chloride treatment.  At this time I will stop sodium chloride infusion.  We will monitor clinically.  I will check serum osmolality cortisol and TSH.  Pressure injury of back, stage 2 (HCC) Patient was noted to have pressure injury stage II of the coccygeal area.  Further daughter shows pressure injury photo of left heel that is currently dressed.  We will request wound care evaluation.  Turn and position patient.  AKI on CKD-3B Patient creatinine 1.34.  Baseline creatinineSeems to be 1.02-1.07.  At this time clinically I feel the patient is fluid overloaded on her legs actually.  Patient has received some sodium chloride infusion in the ER.  I will hold off on further infusion.  I will check urinalysis sodium and creatinine. I will get [place] KUB  Chronic obstructive lung disease (HCC) [place] 2lpm oxygen. I will get baseline CXR.   Home med rec pending.   Advance Care Planning:   Code Status: Limited: Do not attempt resuscitation (DNR) -DNR-LIMITED -Do Not Intubate/DNI  per patient and daughter discussion at bedside.  Consults: none at this time.  Family Communication: daughter at  bedside. All labs reviewed.  Severity of Illness: The appropriate patient status for this patient is INPATIENT. Inpatient status is judged to be reasonable and necessary in order to provide the required intensity of service to ensure the patient's safety. The patient's presenting symptoms, physical exam findings, and initial radiographic and laboratory data in the context of their chronic comorbidities is felt to place them at high risk for further clinical deterioration. Furthermore, it is not anticipated that the patient will be medically stable for discharge from the hospital within 2 midnights of admission.   * I certify that at the point of admission it is my clinical judgment that the patient will require inpatient hospital care spanning beyond 2 midnights from the point of admission due to high intensity of service, high risk for further deterioration and high frequency of surveillance required.*  Author: Nolberto Hanlon, MD 08/27/2023 9:13 PM  For on call review [email or web address].

## 2023-08-27 NOTE — ED Provider Notes (Signed)
[place] EMERGENCY DEPARTMENT AT [facility] Provider Note  CSN: [id number] Arrival date & time: 08/27/23 1746  Chief Complaint(s) Abnormal Lab  HPI [name] is a 89 y.o. female history of pulmonary embolism on chronic warfarin, atrial fibrillation, CKD, COPD, oxygen use presenting to the emergency department with abnormal lab.  The patient was following with her primary doctor, primary doctor drew labs which was notable for hemoglobin of 7.1.  They advised that she should come to the emergency department for further evaluation.  Daughter reports that over the past week the patient has been weaker and more fatigued appearing, she did notice that the patient had some black and foul-smelling stool.  Patient reports currently she feels well.  No increased shortness of breath, abdominal pain, leg swelling, fevers or chills.  She does have some chronic leg swelling which is not significantly different than normal.  No urinary symptoms.  No chest pain or shortness of breath.   Past Medical History Past Medical History:  Diagnosis Date   Anticoagulant long-term use    Arrhythmia    Arthritis    Atrial fibrillation (HCC)    Congestive heart failure (CHF) (HCC)    Dysphagia    Esophageal reflux    Essential hypertension    Hiatal hernia    Pulmonary embolus (HCC)    Thyroid disease    Patient Active Problem List   Diagnosis Date Noted   Physical deconditioning 05/08/2023   Hypokalemia 04/02/2023   Diverticulitis large intestine w/o perforation or abscess w/o bleeding 01/16/2023   Pressure injury of skin 01/16/2023   Sepsis (HCC) 01/15/2023   Hypomagnesemia 01/15/2023   Leg wound, left 01/15/2023   Severe tricuspid valve regurgitation 11/07/2022   Severe aortic stenosis 10/26/2022   Pneumonia due to COVID-19 virus 10/24/2022   COVID-19 10/23/2022   Hypoxia 10/23/2022   Positive blood culture 11/20/2021   Generalized weakness 11/20/2021   Thrombocytopenia (HCC)  11/17/2021   Anemia 11/17/2021   AKI on CKD-3B 11/17/2021   Osteomyelitis (HCC) 11/16/2021   Chronic diastolic CHF (congestive heart failure) (HCC) 11/16/2021   Hyponatremia 11/16/2021   Chronic kidney disease, stage 3b (HCC) 11/16/2021   Leukocytosis 11/16/2021   Uncontrolled hypertension 11/15/2021   Hyperthyroidism 11/15/2021   Peripheral venous insufficiency 11/15/2021   Personal history of pulmonary embolism 11/15/2021   Osteomyelitis of second toe of right foot (HCC) 11/15/2021   Cellulitis 11/15/2021   Chronic thromboembolic pulmonary hypertension (HCC) 05/12/2014   Permanent atrial fibrillation (HCC) 06/20/2009   Chronic obstructive lung disease (HCC) 04/04/2009   Gastroesophageal reflux disease 12/25/2008   Generalized anxiety disorder 12/25/2008   Hypercholesterolemia 12/25/2008   Hypothyroidism 12/25/2008   Home Medication(s) Prior to Admission medications   Medication Sig Start Date End Date Taking? Authorizing Provider  acetaminophen (TYLENOL) 325 MG tablet Take 2 tablets (650 mg total) by mouth every 6 (six) hours as needed for mild pain or headache. Patient taking differently: Take 650 mg by mouth every 6 (six) hours as needed for mild pain (pain score 1-3), headache or fever. 11/06/22   Almon Hercules, MD  ascorbic acid (VITAMIN C) 500 MG tablet Take 500 mg by mouth daily.    [provider]  aspirin EC 81 MG tablet Take 1 tablet (81 mg total) by mouth daily. Swallow whole. 03/04/23 03/03/24  [name], MD  atorvastatin (LIPITOR) 10 MG tablet Take 1 tablet (10 mg total) by mouth daily. 03/04/23 03/03/24  [name], MD  bumetanide Janalyn Harder)  0.5 MG tablet Take 0.5 mg by mouth 2 (two) times daily.    [provider]  carboxymethylcellulose (REFRESH PLUS) 0.5 % SOLN Place 1 drop into both eyes 2 (two) times daily.    [provider]  ciprofloxacin (CIPRO) 250 MG tablet Take 250 mg by mouth 2 (two) times daily. 06/25/23   [provider]  diclofenac Sodium (VOLTAREN) 1 % GEL Apply 4 g topically 4 (four) times daily. To bilateral feet and knees Patient taking differently: Apply 4 g topically 4 (four) times daily as needed (pain). To bilateral feet and knees 11/06/22   Candelaria Stagers T, MD  estradiol (ESTRACE) 0.1 MG/GM vaginal cream Place 1 Applicatorful vaginally 3 (three) times a week. 04/21/23   [provider]  gabapentin (NEURONTIN) 300 MG capsule Take 300 mg by mouth 2 (two) times daily.     [provider]  levothyroxine (SYNTHROID) 100 MCG tablet Take 100 mcg by mouth daily before breakfast. 09/05/21   [provider]  Multiple Vitamins-Minerals (PRESERVISION/LUTEIN) CAPS Take 1 capsule by mouth 2 (two) times daily.     [provider]  ondansetron (ZOFRAN) 4 MG tablet Take 4 mg by mouth daily as needed. 04/17/23   [provider]  OVER THE COUNTER MEDICATION Take 1 capsule by mouth at bedtime. Equate Neuriva    [provider]  pantoprazole (PROTONIX) 40 MG tablet Take 40 mg by mouth daily.    [provider]  senna-docusate (SENOKOT-S) 8.6-50 MG tablet Take 1 tablet by mouth 2 (two) times daily between meals as needed for mild constipation. Patient taking differently: Take 1 tablet by mouth at bedtime. 11/06/22   Almon Hercules, MD  spironolactone (ALDACTONE) 25 MG tablet Take 12.5 mg by mouth daily. 06/19/23   [provider]  vitamin B-12 (CYANOCOBALAMIN) 1000 MCG tablet Take 1,000 mcg by mouth daily.    [provider]  warfarin (COUMADIN) 1 MG tablet Take 1-2 mg by mouth as directed. 1 MG (Mon,Wed,Fri) & 2 MG on (Tues,Thurs,Sat,Sun)    [provider]  zinc gluconate 50 MG tablet Take 50 mg by mouth daily.    [provider]                                                                                                                                    Past Surgical History Past Surgical History:  Procedure  Laterality Date   ABDOMINAL AORTOGRAM W/LOWER EXTREMITY N/A 03/04/2023   Procedure: ABDOMINAL AORTOGRAM W/LOWER EXTREMITY;  Surgeon: [name], MD;  Location: [facility] INVASIVE CV LAB;  Service: Cardiovascular;  Laterality: N/A;   AMPUTATION TOE Right 11/17/2021   Procedure: RIGHT SECOND TOE AMPUTATION;  Surgeon: [name], MD;  Location: [facility] ORS;  Service: Orthopedics;  Laterality: Right;   COLONOSCOPY     ELBOW SURGERY     LAPAROSCOPIC HYSTERECTOMY     Family History Family History  Problem Relation Age of Onset   Stroke Mother    Cancer Brother     Social History Social History   Tobacco Use   Smoking status: Former    Current packs/day: 0.00    Types: Cigarettes    Quit date: 10/02/2001    Years since quitting: 21.9   Smokeless tobacco: Never   Tobacco comments:    former smoker  Vaping Use   Vaping status: Never Used  Substance Use Topics   Alcohol use: Never   Drug use: Never   Allergies Amoxicillin, Azithromycin, Codeine, Erythromycin, Green dyes, Iodine, Misc. sulfonamide containing compounds, Oxycodone, Oxycodone-acetaminophen, Penicillins, Sulfa antibiotics, Sulfasalazine, and Tramadol  Review of Systems Review of Systems  All other systems reviewed and are negative.   Physical Exam Vital Signs  I have reviewed the triage vital signs BP (!) 134/58   Pulse 66   Temp 98.1 F (36.7 C) (Oral)   Resp (!) 23   Ht 5\' 3"  (1.6 m)   Wt 70 kg   SpO2 100%   BMI 27.34 kg/m  Physical Exam Vitals and nursing note reviewed.  Constitutional:      General: She is not in acute distress.    Appearance: She is well-developed.  HENT:     Head: Normocephalic and atraumatic.     Mouth/Throat:     Mouth: Mucous membranes are moist.  Eyes:     Pupils: Pupils are equal, round, and reactive to light.  Cardiovascular:     Rate and Rhythm: Normal rate and regular rhythm.     Heart sounds: No murmur heard. Pulmonary:     Effort: Pulmonary effort is normal. No  respiratory distress.     Breath sounds: Normal breath sounds.  Abdominal:     General: Abdomen is flat.     Palpations: Abdomen is soft.     Tenderness: There is no abdominal tenderness.  Genitourinary:    Comments: Rectal exam chaperoned by RN, dark stool present in vault with no gross blood.  Small stage I pressure ulcer to sacral area Musculoskeletal:        General: No tenderness.     Right lower leg: Edema present.     Left lower leg: Edema present.  Skin:    General: Skin is warm and dry.  Neurological:     General: No focal deficit present.     Mental Status: She is alert. Mental status is at baseline.  Psychiatric:        Mood and Affect: Mood normal.        Behavior: Behavior normal.     ED Results and Treatments Labs (all labs ordered are listed, but only abnormal results are displayed) Labs Reviewed  CBC WITH DIFFERENTIAL/PLATELET - Abnormal; Notable for the following components:      Result Value   RBC 2.68 (*)    Hemoglobin 7.9 (*)    HCT 25.1 (*)    RDW 15.6 (*)    All other components within normal limits  COMPREHENSIVE METABOLIC PANEL - Abnormal; Notable for the following components:   Sodium 132 (*)    Potassium 3.3 (*)    Chloride 94 (*)    Glucose, Bld 109 (*)    Creatinine, Ser 1.34 (*)    Albumin 3.0 (*)    Alkaline Phosphatase 135 (*)    GFR, Estimated 37 (*)    All other components within normal limits  PROTIME-INR - Abnormal; Notable for the following components:   Prothrombin Time  41.0 (*)    INR 4.2 (*)    All other components within normal limits  POC OCCULT BLOOD, ED - Abnormal; Notable for the following components:   Fecal Occult Bld POSITIVE (*)    All other components within normal limits  BRAIN NATRIURETIC PEPTIDE  TYPE AND SCREEN                                                                                                                          Radiology No results found.  Pertinent labs & imaging results that were  available during my care of the patient were reviewed by me and considered in my medical decision making (see MDM for details).  Medications Ordered in ED Medications  phytonadione (VITAMIN K) tablet 2.5 mg (has no administration in time range)  pantoprazole (PROTONIX) injection 80 mg (has no administration in time range)                                                                                                                                     Procedures Procedures  (including critical care time)  Medical Decision Making / ED Course   MDM:  89 year old female presenting to the emergency department abnormal labs.  Patient well-appearing, physical examination with dark stool but no gross blood.Vital signs reassuring.  Patient reports currently she feels okay.  Patient was sent here for hemoglobin of 7.1, on recheck.  Is improved at 7.9.  Her INR is supratherapeutic at 4.2.  Given report of increased fatigue, black stools performed a rectal exam which is positive for occult blood.  Given supratherapeutic INR, age, debility, believe patient will need to come to the hospital for observation.  Given her supratherapeutic INR, give a very low dose of vitamin K 2.5 mg p.o.  She is hemodynamically stable.  Will also treat with Protonix.  Will discuss with hospitalist for admission.  Labs otherwise are reassuring, she does have a very mild AKI.  Clinical Course as of 08/27/23 2021  Thu Aug 27, 2023  2019 Discussed with Dr. Maryjean Ka, hospitalist who will admit the patient for further management. [WS]    Clinical Course User Index [WS] Lonell Grandchild, MD     Additional history obtained:  -External records from outside source obtained and reviewed including: Chart review including previous notes, labs, imaging, consultation notes including prior notes    Lab Tests: -I ordered,  reviewed, and interpreted labs.   The pertinent results include:   Labs Reviewed  CBC WITH  DIFFERENTIAL/PLATELET - Abnormal; Notable for the following components:      Result Value   RBC 2.68 (*)    Hemoglobin 7.9 (*)    HCT 25.1 (*)    RDW 15.6 (*)    All other components within normal limits  COMPREHENSIVE METABOLIC PANEL - Abnormal; Notable for the following components:   Sodium 132 (*)    Potassium 3.3 (*)    Chloride 94 (*)    Glucose, Bld 109 (*)    Creatinine, Ser 1.34 (*)    Albumin 3.0 (*)    Alkaline Phosphatase 135 (*)    GFR, Estimated 37 (*)    All other components within normal limits  PROTIME-INR - Abnormal; Notable for the following components:   Prothrombin Time 41.0 (*)    INR 4.2 (*)    All other components within normal limits  POC OCCULT BLOOD, ED - Abnormal; Notable for the following components:   Fecal Occult Bld POSITIVE (*)    All other components within normal limits  BRAIN NATRIURETIC PEPTIDE  TYPE AND SCREEN    Notable for + FOBT, chronic anemia, supratherapeutic INR    Medicines ordered and prescription drug management: Meds ordered this encounter  Medications   phytonadione (VITAMIN K) tablet 2.5 mg   pantoprazole (PROTONIX) injection 80 mg    -I have reviewed the patients home medicines and have made adjustments as needed   Consultations Obtained: I requested consultation with the hospitalist,  and discussed lab and imaging findings as well as pertinent plan - they recommend: admission    Reevaluation: After the interventions noted above, I reevaluated the patient and found that their symptoms have improved  Co morbidities that complicate the patient evaluation  Past Medical History:  Diagnosis Date   Anticoagulant long-term use    Arrhythmia    Arthritis    Atrial fibrillation (HCC)    Congestive heart failure (CHF) (HCC)    Dysphagia    Esophageal reflux    Essential hypertension    Hiatal hernia    Pulmonary embolus (HCC)    Thyroid disease       Dispostion: Disposition decision including need for  hospitalization was considered, and patient admitted to the hospital.    Final Clinical Impression(s) / ED Diagnoses Final diagnoses:  Upper GI bleeding  Supratherapeutic INR     This chart was dictated using voice recognition software.  Despite best efforts to proofread,  errors can occur which can change the documentation meaning.    Lonell Grandchild, MD 08/27/23 2021

## 2023-08-28 ENCOUNTER — Encounter (HOSPITAL_COMMUNITY): Payer: Self-pay | Admitting: Internal Medicine

## 2023-08-28 DIAGNOSIS — D649 Anemia, unspecified: Secondary | ICD-10-CM | POA: Diagnosis not present

## 2023-08-28 LAB — HEPATIC FUNCTION PANEL
ALT: 13 U/L (ref 0–44)
AST: 24 U/L (ref 15–41)
Albumin: 2.3 g/dL — ABNORMAL LOW (ref 3.5–5.0)
Alkaline Phosphatase: 100 U/L (ref 38–126)
Bilirubin, Direct: 0.2 mg/dL (ref 0.0–0.2)
Indirect Bilirubin: 0.9 mg/dL (ref 0.3–0.9)
Total Bilirubin: 1.1 mg/dL (ref <1.2)
Total Protein: 5.7 g/dL — ABNORMAL LOW (ref 6.5–8.1)

## 2023-08-28 LAB — URINALYSIS, COMPLETE (UACMP) WITH MICROSCOPIC
Bilirubin Urine: NEGATIVE
Glucose, UA: NEGATIVE mg/dL
Ketones, ur: NEGATIVE mg/dL
Nitrite: NEGATIVE
Protein, ur: NEGATIVE mg/dL
Specific Gravity, Urine: 1.009 (ref 1.005–1.030)
pH: 7 (ref 5.0–8.0)

## 2023-08-28 LAB — BASIC METABOLIC PANEL
Anion gap: 8 (ref 5–15)
BUN: 18 mg/dL (ref 8–23)
CO2: 26 mmol/L (ref 22–32)
Calcium: 8.5 mg/dL — ABNORMAL LOW (ref 8.9–10.3)
Chloride: 94 mmol/L — ABNORMAL LOW (ref 98–111)
Creatinine, Ser: 1.3 mg/dL — ABNORMAL HIGH (ref 0.44–1.00)
GFR, Estimated: 38 mL/min — ABNORMAL LOW (ref >60)
Glucose, Bld: 90 mg/dL (ref 70–99)
Potassium: 3.9 mmol/L (ref 3.5–5.1)
Sodium: 128 mmol/L — ABNORMAL LOW (ref 135–145)

## 2023-08-28 LAB — CBC
HCT: 18.5 % — ABNORMAL LOW (ref 36.0–46.0)
Hemoglobin: 5.8 g/dL — CL (ref 12.0–15.0)
MCH: 29 pg (ref 26.0–34.0)
MCHC: 31.4 g/dL (ref 30.0–36.0)
MCV: 92.5 fL (ref 80.0–100.0)
Platelets: 231 10*3/uL (ref 150–400)
RBC: 2 MIL/uL — ABNORMAL LOW (ref 3.87–5.11)
RDW: 15.7 % — ABNORMAL HIGH (ref 11.5–15.5)
WBC: 5.9 10*3/uL (ref 4.0–10.5)
nRBC: 0 % (ref 0.0–0.2)

## 2023-08-28 LAB — FERRITIN: Ferritin: 45 ng/mL (ref 11–307)

## 2023-08-28 LAB — IRON AND TIBC
Iron: 32 ug/dL (ref 28–170)
Saturation Ratios: 13 % (ref 10.4–31.8)
TIBC: 246 ug/dL — ABNORMAL LOW (ref 250–450)
UIBC: 214 ug/dL

## 2023-08-28 LAB — FOLATE: Folate: 11.5 ng/mL (ref >5.9)

## 2023-08-28 LAB — RETICULOCYTES
Immature Retic Fract: 26.4 % — ABNORMAL HIGH (ref 2.3–15.9)
RBC.: 2.05 MIL/uL — ABNORMAL LOW (ref 3.87–5.11)
Retic Count, Absolute: 56.4 10*3/uL (ref 19.0–186.0)
Retic Ct Pct: 2.8 % (ref 0.4–3.1)

## 2023-08-28 LAB — CREATININE, URINE, RANDOM: Creatinine, Urine: 39 mg/dL

## 2023-08-28 LAB — PROTIME-INR
INR: 3.5 — ABNORMAL HIGH (ref 0.8–1.2)
Prothrombin Time: 35.1 s — ABNORMAL HIGH (ref 11.4–15.2)

## 2023-08-28 LAB — PREPARE RBC (CROSSMATCH)

## 2023-08-28 LAB — HEMOGLOBIN AND HEMATOCRIT, BLOOD
HCT: 26.3 % — ABNORMAL LOW (ref 36.0–46.0)
Hemoglobin: 8.3 g/dL — ABNORMAL LOW (ref 12.0–15.0)

## 2023-08-28 LAB — CORTISOL-AM, BLOOD: Cortisol - AM: 9.2 ug/dL (ref 6.7–22.6)

## 2023-08-28 LAB — SODIUM, URINE, RANDOM: Sodium, Ur: 75 mmol/L

## 2023-08-28 LAB — OSMOLALITY, URINE: Osmolality, Ur: 320 mosm/kg (ref 300–900)

## 2023-08-28 LAB — APTT: aPTT: 65 s — ABNORMAL HIGH (ref 24–36)

## 2023-08-28 LAB — VITAMIN B12: Vitamin B-12: 1036 pg/mL — ABNORMAL HIGH (ref 180–914)

## 2023-08-28 MED ORDER — ACETAMINOPHEN 325 MG PO TABS
650.0000 mg | ORAL_TABLET | ORAL | Status: AC | PRN
  Administered 2023-08-28: 650 mg via ORAL
  Filled 2023-08-28: qty 2

## 2023-08-28 MED ORDER — GERHARDT'S BUTT CREAM
TOPICAL_CREAM | Freq: Two times a day (BID) | CUTANEOUS | Status: DC
Start: 2023-08-28 — End: 2023-09-01
  Filled 2023-08-28: qty 60

## 2023-08-28 MED ORDER — SODIUM CHLORIDE 0.9 % IV SOLN: INTRAVENOUS | Status: AC

## 2023-08-28 MED ORDER — SPIRONOLACTONE 12.5 MG HALF TABLET
12.5000 mg | ORAL_TABLET | Freq: Every day | ORAL | Status: DC
  Administered 2023-08-28 – 2023-09-01 (×5): 12.5 mg via ORAL
  Filled 2023-08-28 (×5): qty 1

## 2023-08-28 MED ORDER — LEVOTHYROXINE SODIUM 100 MCG PO TABS
100.0000 ug | ORAL_TABLET | Freq: Every day | ORAL | Status: DC
  Administered 2023-08-29 – 2023-09-01 (×4): 100 ug via ORAL
  Filled 2023-08-28 (×4): qty 1

## 2023-08-28 MED ORDER — SODIUM CHLORIDE 0.9% IV SOLUTION: Freq: Once | INTRAVENOUS | Status: AC

## 2023-08-28 MED ORDER — PANTOPRAZOLE SODIUM 40 MG IV SOLR: 40.0000 mg | Freq: Two times a day (BID) | INTRAVENOUS | Status: DC

## 2023-08-28 MED ORDER — ATORVASTATIN CALCIUM 10 MG PO TABS
10.0000 mg | ORAL_TABLET | Freq: Every day | ORAL | Status: DC
  Administered 2023-08-28 – 2023-09-01 (×5): 10 mg via ORAL
  Filled 2023-08-28 (×5): qty 1

## 2023-08-28 MED ORDER — DICLOFENAC SODIUM 1 % EX GEL
4.0000 g | Freq: Four times a day (QID) | CUTANEOUS | Status: DC
  Administered 2023-08-28 – 2023-09-01 (×10): 4 g via TOPICAL
  Filled 2023-08-28: qty 100

## 2023-08-28 MED ORDER — VITAMIN C 500 MG PO TABS: 500.0000 mg | ORAL_TABLET | ORAL | Status: DC

## 2023-08-28 MED ORDER — GABAPENTIN 300 MG PO CAPS
300.0000 mg | ORAL_CAPSULE | Freq: Two times a day (BID) | ORAL | Status: DC
  Administered 2023-08-28 – 2023-09-01 (×9): 300 mg via ORAL
  Filled 2023-08-28 (×9): qty 1

## 2023-08-28 NOTE — ED Notes (Signed)
Spoke to pt daughter, given update and blood consent obtained

## 2023-08-28 NOTE — Consult Note (Signed)
Reason for Consult: Anemia guaiac positivity Referring Physician: Hospital team  [name] is an 89 y.o. female.  HPI: Patient seen and examined in her hospital computer chart as well as our office computer chart reviewed and she has had a slow drop in her hemoglobin over the months and is on both Coumadin and aspirin and today her INR is supratherapeutic probably due to con commitment antibiotic use and she herself may have a little constipation but has not seen any blood and has no other GI complaints and is not on a stomach pill and she did have a CT earlier in the year which was okay and she was not iron deficient and has lots of bruising and no other complaints  Past Medical History:  Diagnosis Date   Anticoagulant long-term use    Arrhythmia    Arthritis    Atrial fibrillation (HCC)    Congestive heart failure (CHF) (HCC)    Dysphagia    Esophageal reflux    Essential hypertension    Hiatal hernia    Pulmonary embolus (HCC)    Thyroid disease     Past Surgical History:  Procedure Laterality Date   ABDOMINAL AORTOGRAM W/LOWER EXTREMITY N/A 03/04/2023   Procedure: ABDOMINAL AORTOGRAM W/LOWER EXTREMITY;  Surgeon: [name], MD;  Location: [facility] INVASIVE CV LAB;  Service: Cardiovascular;  Laterality: N/A;   AMPUTATION TOE Right 11/17/2021   Procedure: RIGHT SECOND TOE AMPUTATION;  Surgeon: [name], MD;  Location: [facility] ORS;  Service: Orthopedics;  Laterality: Right;   COLONOSCOPY     ELBOW SURGERY     LAPAROSCOPIC HYSTERECTOMY      Family History  Problem Relation Age of Onset   Stroke Mother    Cancer Brother     Social History:  reports that she quit smoking about 21 years ago. Her smoking use included cigarettes. She has never used smokeless tobacco. She reports that she does not drink alcohol and does not use drugs.  Allergies:  Allergies  Allergen Reactions   Amoxicillin Other (See Comments)    Unknown reaction  Tolerates Keflex, Cefepime   Azithromycin  Other (See Comments)    Unknown reaction    Codeine Other (See Comments)    Unknown reaction  Other Reaction(s): Not available, Not available, Not available, Not available, Not available, Not available, Not available   Erythromycin Other (See Comments)    Unknown reaction    Green Dyes Other (See Comments)    Allergic to ALL dyes   Iodine Other (See Comments)    Unknown reaction    Misc. Sulfonamide Containing Compounds    Oxycodone Other (See Comments)    Unknown reaction      Oxycodone-Acetaminophen Other (See Comments)    Unknown reaction    Penicillins Other (See Comments)    Unknown reaction  Tolerates Keflex, Cefepime   Sulfa Antibiotics     Per Pt's daughter - unknown reaction    Sulfasalazine     Unsure of allergy    Tramadol Hives    Medications: I have reviewed the patient's current medications.  Results for orders placed or performed during the hospital encounter of 08/27/23 (from the past 48 hours)  CBC with Differential     Status: Abnormal   Collection Time: 08/27/23  6:06 PM  Result Value Ref Range   WBC 7.8 4.0 - 10.5 K/uL   RBC 2.68 (L) 3.87 - 5.11 MIL/uL   Hemoglobin 7.9 (L) 12.0 - 15.0 g/dL   HCT 95.6 (  L) 36.0 - 46.0 %   MCV 93.7 80.0 - 100.0 fL   MCH 29.5 26.0 - 34.0 pg   MCHC 31.5 30.0 - 36.0 g/dL   RDW 16.1 (H) 09.6 - 04.5 %   Platelets 291 150 - 400 K/uL   nRBC 0.0 0.0 - 0.2 %   Neutrophils Relative % 71 %   Neutro Abs 5.5 1.7 - 7.7 K/uL   Lymphocytes Relative 13 %   Lymphs Abs 1.0 0.7 - 4.0 K/uL   Monocytes Relative 10 %   Monocytes Absolute 0.8 0.1 - 1.0 K/uL   Eosinophils Relative 4 %   Eosinophils Absolute 0.3 0.0 - 0.5 K/uL   Basophils Relative 1 %   Basophils Absolute 0.1 0.0 - 0.1 K/uL   Immature Granulocytes 1 %   Abs Immature Granulocytes 0.06 0.00 - 0.07 K/uL    Comment: Performed at [facility], [address], [place], [place] [address]  Comprehensive metabolic panel     Status: Abnormal   Collection  Time: 08/27/23  6:06 PM  Result Value Ref Range   Sodium 132 (L) 135 - 145 mmol/L   Potassium 3.3 (L) 3.5 - 5.1 mmol/L   Chloride 94 (L) 98 - 111 mmol/L   CO2 28 22 - 32 mmol/L   Glucose, Bld 109 (H) 70 - 99 mg/dL    Comment: Glucose reference range applies only to samples taken after fasting for at least 8 hours.   BUN 19 8 - 23 mg/dL   Creatinine, Ser 1.91 (H) 0.44 - 1.00 mg/dL   Calcium 9.2 8.9 - 47.8 mg/dL   Total Protein 7.3 6.5 - 8.1 g/dL   Albumin 3.0 (L) 3.5 - 5.0 g/dL   AST 30 15 - 41 U/L   ALT 16 0 - 44 U/L   Alkaline Phosphatase 135 (H) 38 - 126 U/L   Total Bilirubin 0.8 <1.2 mg/dL   GFR, Estimated 37 (L) >60 mL/min    Comment: (NOTE) Calculated using the CKD-EPI Creatinine Equation (2021)    Anion gap 10 5 - 15    Comment: Performed at [facility], [address], [place], [place] [address]  Type and screen [facility]     Status: None (Preliminary result)   Collection Time: 08/27/23  6:06 PM  Result Value Ref Range   ABO/RH(D) A POS    Antibody Screen NEG    Sample Expiration 08/30/2023,2359    Unit Number [id number]    Blood Component Type RED CELLS,LR    Unit division 00    Status of Unit ISSUED    Transfusion Status OK TO TRANSFUSE    Crossmatch Result      Compatible Performed at [facility], [address], [place], [place] [address]   Protime-INR     Status: Abnormal   Collection Time: 08/27/23  6:06 PM  Result Value Ref Range   Prothrombin Time 41.0 (H) 11.4 - 15.2 seconds   INR 4.2 (HH) 0.8 - 1.2    Comment: CRITICAL RESULT CALLED TO, READ BACK BY AND VERIFIED WITH: Rush Farmer., RN AT 1837 08/27/23 RAM (NOTE) INR goal varies based on device and disease states. Performed at [facility], [address] [place], [place] [address]   Brain natriuretic peptide     Status: Abnormal   Collection Time: 08/27/23  6:06 PM  Result Value Ref Range   B Natriuretic Peptide  264.7 (H) 0.0 - 100.0 pg/mL  Comment: Performed at [facility], [address], [place], [place] [address]  POC occult blood, ED Provider will collect     Status: Abnormal   Collection Time: 08/27/23  7:58 PM  Result Value Ref Range   Fecal Occult Bld POSITIVE (A) NEGATIVE  Osmolality     Status: None   Collection Time: 08/27/23 10:10 PM  Result Value Ref Range   Osmolality 289 275 - 295 mOsm/kg    Comment: Performed at [facility], [address] [place], [place] [address]  APTT     Status: Abnormal   Collection Time: 08/28/23  6:42 AM  Result Value Ref Range   aPTT 65 (H) 24 - 36 seconds    Comment:        IF BASELINE aPTT IS ELEVATED, SUGGEST PATIENT RISK ASSESSMENT BE USED TO DETERMINE APPROPRIATE ANTICOAGULANT THERAPY. Performed at [facility], [address] [place], [place] [address]   Protime-INR     Status: Abnormal   Collection Time: 08/28/23  6:42 AM  Result Value Ref Range   Prothrombin Time 35.1 (H) 11.4 - 15.2 seconds   INR 3.5 (H) 0.8 - 1.2    Comment: (NOTE) INR goal varies based on device and disease states. Performed at [facility], [address] [place], [place] [address]   Basic metabolic panel     Status: Abnormal   Collection Time: 08/28/23  6:42 AM  Result Value Ref Range   Sodium 128 (L) 135 - 145 mmol/L   Potassium 3.9 3.5 - 5.1 mmol/L   Chloride 94 (L) 98 - 111 mmol/L   CO2 26 22 - 32 mmol/L   Glucose, Bld 90 70 - 99 mg/dL    Comment: Glucose reference range applies only to samples taken after fasting for at least 8 hours.   BUN 18 8 - 23 mg/dL   Creatinine, Ser 6.57 (H) 0.44 - 1.00 mg/dL   Calcium 8.5 (L) 8.9 - 10.3 mg/dL   GFR, Estimated 38 (L) >60 mL/min    Comment: (NOTE) Calculated using the CKD-EPI Creatinine Equation (2021)    Anion gap 8 5 - 15    Comment: Performed at [facility], [address] [place], [place] [address]  CBC     Status:  Abnormal   Collection Time: 08/28/23  6:42 AM  Result Value Ref Range   WBC 5.9 4.0 - 10.5 K/uL   RBC 2.00 (L) 3.87 - 5.11 MIL/uL   Hemoglobin 5.8 (LL) 12.0 - 15.0 g/dL    Comment: REPEATED TO VERIFY THIS CRITICAL RESULT HAS VERIFIED AND BEEN CALLED TO BRUNSON,T RN BY GOLSON,M ON 12 20 2024 AT 0709, AND HAS BEEN READ BACK.     HCT 18.5 (L) 36.0 - 46.0 %   MCV 92.5 80.0 - 100.0 fL   MCH 29.0 26.0 - 34.0 pg   MCHC 31.4 30.0 - 36.0 g/dL   RDW 29.5 (H) 28.4 - 13.2 %   Platelets 231 150 - 400 K/uL   nRBC 0.0 0.0 - 0.2 %    Comment: Performed at [facility], [address] [place], [place] [address]  Vitamin B12     Status: Abnormal   Collection Time: 08/28/23  6:42 AM  Result Value Ref Range   Vitamin B-12 1,036 (H) 180 - 914 pg/mL    Comment: (NOTE) This assay is not validated for testing neonatal or myeloproliferative syndrome specimens for Vitamin B12 levels. Performed  at [facility], [address], [place], [place] [address]   Folate     Status: None   Collection Time: 08/28/23  6:42 AM  Result Value Ref Range   Folate 11.5 >5.9 ng/mL    Comment: Performed at [facility], [address] [place], [place] [address]  Iron and TIBC     Status: Abnormal   Collection Time: 08/28/23  6:42 AM  Result Value Ref Range   Iron 32 28 - 170 ug/dL   TIBC 098 (L) 119 - 147 ug/dL   Saturation Ratios 13 10.4 - 31.8 %   UIBC 214 ug/dL    Comment: Performed at [facility], [address], [place], [place] [address]  Ferritin     Status: None   Collection Time: 08/28/23  6:42 AM  Result Value Ref Range   Ferritin 45 11 - 307 ng/mL    Comment: Performed at [facility], [address], [place], [place] [address]  Reticulocytes     Status: Abnormal   Collection Time: 08/28/23  6:42 AM  Result Value Ref Range   Retic Ct Pct 2.8 0.4 - 3.1 %   RBC. 2.05 (L) 3.87 - 5.11 MIL/uL   Retic Count,  Absolute 56.4 19.0 - 186.0 K/uL   Immature Retic Fract 26.4 (H) 2.3 - 15.9 %    Comment: Performed at [facility], [address] [place], [place] [address]  Hepatic function panel     Status: Abnormal   Collection Time: 08/28/23  6:42 AM  Result Value Ref Range   Total Protein 5.7 (L) 6.5 - 8.1 g/dL   Albumin 2.3 (L) 3.5 - 5.0 g/dL   AST 24 15 - 41 U/L   ALT 13 0 - 44 U/L   Alkaline Phosphatase 100 38 - 126 U/L   Total Bilirubin 1.1 <1.2 mg/dL   Bilirubin, Direct 0.2 0.0 - 0.2 mg/dL   Indirect Bilirubin 0.9 0.3 - 0.9 mg/dL    Comment: Performed at [facility], [address], [place], [place] [address]  Prepare RBC (crossmatch)     Status: None   Collection Time: 08/28/23  7:27 AM  Result Value Ref Range   Order Confirmation      ORDER PROCESSED BY BLOOD BANK Performed at [facility], [address], [place], [place] [address]   Sodium, urine, random     Status: None   Collection Time: 08/28/23 10:20 AM  Result Value Ref Range   Sodium, Ur 75 mmol/L    Comment: Performed at [facility], [address] [place], [place] [address]  Urinalysis, Complete w Microscopic -Urine, Clean Catch     Status: Abnormal   Collection Time: 08/28/23 10:20 AM  Result Value Ref Range   Color, Urine YELLOW YELLOW   APPearance CLEAR CLEAR   Specific Gravity, Urine 1.009 1.005 - 1.030   pH 7.0 5.0 - 8.0   Glucose, UA NEGATIVE NEGATIVE mg/dL   Hgb urine dipstick MODERATE (A) NEGATIVE   Bilirubin Urine NEGATIVE NEGATIVE   Ketones, ur NEGATIVE NEGATIVE mg/dL   Protein, ur NEGATIVE NEGATIVE mg/dL   Nitrite NEGATIVE NEGATIVE   Leukocytes,Ua LARGE (A) NEGATIVE   RBC / HPF 0-5 0 - 5 RBC/hpf   WBC, UA 11-20 0 - 5 WBC/hpf   Bacteria, UA RARE (A) NONE SEEN   Squamous Epithelial / HPF 0-5 0 - 5 /HPF    Comment: Performed  at [facility], [address] [place], [place] [address]  Creatinine, urine,  random     Status: None   Collection Time: 08/28/23 10:20 AM  Result Value Ref Range   Creatinine, Urine 39 mg/dL    Comment: Performed at [facility], [address], [place], [place] [address]    US RENAL Result Date: 08/28/2023 CLINICAL DATA:  Acute kidney injury EXAM: RENAL / URINARY TRACT ULTRASOUND COMPLETE COMPARISON:  CT abdomen and pelvis 01/15/2023 FINDINGS: Right Kidney: Renal measurements: 7.8 x 4.2 x 4.5 cm = volume: 78 mL. Echogenicity is increased. No mass or hydronephrosis visualized. Left Kidney: Renal measurements: 8.9 x 4.7 x 3.9 cm = volume: 86 mL. Echogenicity is increased. There is no hydronephrosis. There is a 3.3 x 3.4 x 3.0 cm simple cyst in the inferior pole of the left kidney. Bladder: Appears normal for degree of bladder distention. Other: None. IMPRESSION: 1. No hydronephrosis. 2. Increased renal echogenicity bilaterally consistent with medical renal disease. 3. Simple cyst in the inferior pole of the left kidney. Electronically Signed   By: Darliss Cheney M.D.   On: 08/28/2023 00:07   DG CHEST PORT 1 VIEW Result Date: 08/27/2023 CLINICAL DATA:  Shortness of breath.  COPD exacerbation. EXAM: PORTABLE CHEST 1 VIEW COMPARISON:  05/05/2023 FINDINGS: Stable cardiomegaly. Aortic atherosclerotic calcification. Hazy bilateral airspace and interstitial opacities greatest in the lower lungs. Small bilateral pleural effusions. Advanced arthritis both shoulders. IMPRESSION: Cardiomegaly with pulmonary edema and small bilateral pleural effusions. Electronically Signed   By: Minerva Fester M.D.   On: 08/27/2023 23:40    Review of Systems patient does want to go home does not know why she is in the hospital Blood pressure 112/83, pulse 72, temperature 98 F (36.7 C), resp. rate 16, height 5\' 3"  (1.6 m), weight 70 kg, SpO2 100%. Physical Exam elderly pleasant no acute distress lying comfortably in the bed abdomen is soft nontender BUN and creatinine at baseline  liver test normal BUN 2.3 not iron deficient hemoglobin dropped from 7.9-5.8 she had been running 9 and then 8 as an outpatient INR increased to 4.2 on admission  Assessment/Plan: Elderly lady with chronic anemia on Coumadin and aspirin with recent increase drop questionable etiology Plan: Reevaluate her aspirin and blood thinner needs hopefully she can be managed on less consider low-dose long-term pump inhibitor if aspirin is going to be needed or just to prevent peptic ulcer disease even if we stop aspirin now and would empirically treat for at least 3 months if we are stopping the aspirin and she has no signs for urgent intervention and consider FFP only if signs of active bleeding otherwise let INR drift back to the normal range but maybe she would be better served on either just an aspirin a day or a single agent and please call Dr. Dulce Sellar this weekend if any GI question or problem otherwise reconsult [place] early next week if endoscopy is truly requested otherwise happy to see back in follow-up to decide if an outpatient procedure is needed and if truly concerned about anything significant consider a repeat CT scan  [facility] E 08/28/2023, 2:50 PM

## 2023-08-28 NOTE — ED Notes (Signed)
Blood bank has blood ready for this patient.  Notified Taylor,RN.

## 2023-08-28 NOTE — ED Notes (Signed)
ED TO INPATIENT HANDOFF REPORT  ED Nurse Name and Phone #: Darrall Strey  S Name/Age/Gender [name] 89 y.o. female Room/Bed: WA17/WA17  Code Status   Code Status: Limited: Do not attempt resuscitation (DNR) -DNR-LIMITED -Do Not Intubate/DNI   Home/SNF/Other Home Patient oriented to: self and place Is this baseline? Yes   Triage Complete: Triage complete  Chief Complaint Anemia [D64.9]  Triage Note Patient was called that her hemoglobin was 7.1. History of getting blood transfusions. Denies bleeding from anywhere. Does not feel fatigued.    Allergies Allergies  Allergen Reactions   Amoxicillin Other (See Comments)    Unknown reaction  Tolerates Keflex, Cefepime   Azithromycin Other (See Comments)    Unknown reaction    Codeine Other (See Comments)    Unknown reaction  Other Reaction(s): Not available, Not available, Not available, Not available, Not available, Not available, Not available   Erythromycin Other (See Comments)    Unknown reaction    Green Dyes Other (See Comments)    Allergic to ALL dyes   Iodine Other (See Comments)    Unknown reaction    Misc. Sulfonamide Containing Compounds    Oxycodone Other (See Comments)    Unknown reaction      Oxycodone-Acetaminophen Other (See Comments)    Unknown reaction    Penicillins Other (See Comments)    Unknown reaction  Tolerates Keflex, Cefepime   Sulfa Antibiotics     Per Pt's daughter - unknown reaction    Sulfasalazine     Unsure of allergy    Tramadol Hives    Level of Care/Admitting Diagnosis ED Disposition     ED Disposition  Admit   Condition  --   Comment  Hospital Area: [facility] [[id number]]  Level of Care: Progressive [102]  Admit to Progressive based on following criteria: CARDIOVASCULAR & THORACIC of moderate stability with acute coronary syndrome symptoms/low risk myocardial infarction/hypertensive urgency/arrhythmias/heart failure potentially compromising stability  and stable post cardiovascular intervention patients.  May admit patient to [facility] or [facility] if equivalent level of care is available:: No  Covid Evaluation: Asymptomatic - no recent exposure (last 10 days) testing not required  Diagnosis: Anemia [242168]  Admitting Physician: Nolberto Hanlon [[id number]]  Attending Physician: Nolberto Hanlon [[id number]]  Certification:: I certify this patient will need inpatient services for at least 2 midnights  Expected Medical Readiness: 08/29/2023          B Medical/Surgery History Past Medical History:  Diagnosis Date   Anticoagulant long-term use    Arrhythmia    Arthritis    Atrial fibrillation (HCC)    Congestive heart failure (CHF) (HCC)    Dysphagia    Esophageal reflux    Essential hypertension    Hiatal hernia    Pulmonary embolus (HCC)    Thyroid disease    Past Surgical History:  Procedure Laterality Date   ABDOMINAL AORTOGRAM W/LOWER EXTREMITY N/A 03/04/2023   Procedure: ABDOMINAL AORTOGRAM W/LOWER EXTREMITY;  Surgeon: [name], MD;  Location: [facility] INVASIVE CV LAB;  Service: Cardiovascular;  Laterality: N/A;   AMPUTATION TOE Right 11/17/2021   Procedure: RIGHT SECOND TOE AMPUTATION;  Surgeon: [name], MD;  Location: [facility] ORS;  Service: Orthopedics;  Laterality: Right;   COLONOSCOPY     ELBOW SURGERY     LAPAROSCOPIC HYSTERECTOMY       A IV Location/Drains/Wounds Patient Lines/Drains/Airways Status     Active Line/Drains/Airways     Name Placement date Placement time Site Days  Peripheral IV 08/28/23 20 G Left Antecubital 08/28/23  0000  Antecubital  less than 1   Pressure Injury 01/16/23 Heel Left Unstageable - Full thickness tissue loss in which the base of the injury is covered by slough (yellow, tan, gray, green or brown) and/or eschar (tan, brown or black) in the wound bed. 01/16/23  0022  -- 224   Pressure Injury 01/16/23 Sacrum Deep Tissue Pressure Injury - Purple or maroon localized area of discolored  intact skin or blood-filled blister due to damage of underlying soft tissue from pressure and/or shear. 01/16/23  0025  -- 224   Pressure Injury 05/06/23 Toe (Comment  which one) Anterior;Right Stage 2 -  Partial thickness loss of dermis presenting as a shallow open injury with a red, pink wound bed without slough. Full thickness wound, NOT a pressure injury 05/06/23  1500  -- 114   Pressure Injury 05/06/23 Toe (Comment  which one) Anterior;Left Unstageable - Full thickness tissue loss in which the base of the injury is covered by slough (yellow, tan, gray, green or brown) and/or eschar (tan, brown or black) in the wound bed. full t 05/06/23  1500  -- 114   Pressure Injury 08/28/23 Buttocks Right;Left Stage 2 -  Partial thickness loss of dermis presenting as a shallow open injury with a red, pink wound bed without slough. R upper buttock 0.5 cm x 0.5 cm x 0.1 cm; L upper buttock 1 cm x 0.5 cm x 0.1 cm 08/28/23  --  -- less than 1            Intake/Output Last 24 hours  Intake/Output Summary (Last 24 hours) at 08/28/2023 1631 Last data filed at 08/28/2023 1606 Gross per 24 hour  Intake 412 ml  Output --  Net 412 ml    Labs/Imaging Results for orders placed or performed during the hospital encounter of 08/27/23 (from the past 48 hours)  CBC with Differential     Status: Abnormal   Collection Time: 08/27/23  6:06 PM  Result Value Ref Range   WBC 7.8 4.0 - 10.5 K/uL   RBC 2.68 (L) 3.87 - 5.11 MIL/uL   Hemoglobin 7.9 (L) 12.0 - 15.0 g/dL   HCT 09.8 (L) 11.9 - 14.7 %   MCV 93.7 80.0 - 100.0 fL   MCH 29.5 26.0 - 34.0 pg   MCHC 31.5 30.0 - 36.0 g/dL   RDW 82.9 (H) 56.2 - 13.0 %   Platelets 291 150 - 400 K/uL   nRBC 0.0 0.0 - 0.2 %   Neutrophils Relative % 71 %   Neutro Abs 5.5 1.7 - 7.7 K/uL   Lymphocytes Relative 13 %   Lymphs Abs 1.0 0.7 - 4.0 K/uL   Monocytes Relative 10 %   Monocytes Absolute 0.8 0.1 - 1.0 K/uL   Eosinophils Relative 4 %   Eosinophils Absolute 0.3 0.0 - 0.5  K/uL   Basophils Relative 1 %   Basophils Absolute 0.1 0.0 - 0.1 K/uL   Immature Granulocytes 1 %   Abs Immature Granulocytes 0.06 0.00 - 0.07 K/uL    Comment: Performed at [facility], [address] [place], [place] [address]  Comprehensive metabolic panel     Status: Abnormal   Collection Time: 08/27/23  6:06 PM  Result Value Ref Range   Sodium 132 (L) 135 - 145 mmol/L   Potassium 3.3 (L) 3.5 - 5.1 mmol/L   Chloride 94 (L) 98 - 111 mmol/L   CO2 28 22 -  32 mmol/L   Glucose, Bld 109 (H) 70 - 99 mg/dL    Comment: Glucose reference range applies only to samples taken after fasting for at least 8 hours.   BUN 19 8 - 23 mg/dL   Creatinine, Ser 1.61 (H) 0.44 - 1.00 mg/dL   Calcium 9.2 8.9 - 09.6 mg/dL   Total Protein 7.3 6.5 - 8.1 g/dL   Albumin 3.0 (L) 3.5 - 5.0 g/dL   AST 30 15 - 41 U/L   ALT 16 0 - 44 U/L   Alkaline Phosphatase 135 (H) 38 - 126 U/L   Total Bilirubin 0.8 <1.2 mg/dL   GFR, Estimated 37 (L) >60 mL/min    Comment: (NOTE) Calculated using the CKD-EPI Creatinine Equation (2021)    Anion gap 10 5 - 15    Comment: Performed at [facility], [address] [place], [place] [address]  Type and screen [facility]     Status: None (Preliminary result)   Collection Time: 08/27/23  6:06 PM  Result Value Ref Range   ABO/RH(D) A POS    Antibody Screen NEG    Sample Expiration 08/30/2023,2359    Unit Number [id number]    Blood Component Type RED CELLS,LR    Unit division 00    Status of Unit ISSUED    Transfusion Status OK TO TRANSFUSE    Crossmatch Result      Compatible Performed at [facility], [address], [place], [place] [address]   Protime-INR     Status: Abnormal   Collection Time: 08/27/23  6:06 PM  Result Value Ref Range   Prothrombin Time 41.0 (H) 11.4 - 15.2 seconds   INR 4.2 (HH) 0.8 - 1.2    Comment: CRITICAL RESULT CALLED TO, READ BACK BY AND VERIFIED WITH: Rush Farmer., RN AT 1837 08/27/23 RAM (NOTE) INR goal varies based on device and disease states. Performed at [facility], [address], [place], [place] [address]   Brain natriuretic peptide     Status: Abnormal   Collection Time: 08/27/23  6:06 PM  Result Value Ref Range   B Natriuretic Peptide 264.7 (H) 0.0 - 100.0 pg/mL    Comment: Performed at [facility], [address], [place], [place] [address]  POC occult blood, ED Provider will collect     Status: Abnormal   Collection Time: 08/27/23  7:58 PM  Result Value Ref Range   Fecal Occult Bld POSITIVE (A) NEGATIVE  Osmolality     Status: None   Collection Time: 08/27/23 10:10 PM  Result Value Ref Range   Osmolality 289 275 - 295 mOsm/kg    Comment: Performed at [facility], [address] [place], [place] [address]  APTT     Status: Abnormal   Collection Time: 08/28/23  6:42 AM  Result Value Ref Range   aPTT 65 (H) 24 - 36 seconds    Comment:        IF BASELINE aPTT IS ELEVATED, SUGGEST PATIENT RISK ASSESSMENT BE USED TO DETERMINE APPROPRIATE ANTICOAGULANT THERAPY. Performed at [facility], [address] [place], [place] [address]   Protime-INR     Status: Abnormal   Collection Time: 08/28/23  6:42 AM  Result Value Ref Range   Prothrombin Time 35.1 (H) 11.4 - 15.2 seconds   INR 3.5 (H) 0.8 - 1.2    Comment: (NOTE) INR goal varies based on device and disease states.  Performed at [facility], [address], [place], [place] [address]   Basic metabolic panel     Status: Abnormal   Collection Time: 08/28/23  6:42 AM  Result Value Ref Range   Sodium 128 (L) 135 - 145 mmol/L   Potassium 3.9 3.5 - 5.1 mmol/L   Chloride 94 (L) 98 - 111 mmol/L   CO2 26 22 - 32 mmol/L   Glucose, Bld 90 70 - 99 mg/dL    Comment: Glucose reference range applies only to samples taken after fasting for at least 8 hours.   BUN 18 8 - 23 mg/dL   Creatinine, Ser  2.13 (H) 0.44 - 1.00 mg/dL   Calcium 8.5 (L) 8.9 - 10.3 mg/dL   GFR, Estimated 38 (L) >60 mL/min    Comment: (NOTE) Calculated using the CKD-EPI Creatinine Equation (2021)    Anion gap 8 5 - 15    Comment: Performed at [facility], [address] [place], [place] [address]  CBC     Status: Abnormal   Collection Time: 08/28/23  6:42 AM  Result Value Ref Range   WBC 5.9 4.0 - 10.5 K/uL   RBC 2.00 (L) 3.87 - 5.11 MIL/uL   Hemoglobin 5.8 (LL) 12.0 - 15.0 g/dL    Comment: REPEATED TO VERIFY THIS CRITICAL RESULT HAS VERIFIED AND BEEN CALLED TO BRUNSON,T RN BY GOLSON,M ON 12 20 2024 AT 0709, AND HAS BEEN READ BACK.     HCT 18.5 (L) 36.0 - 46.0 %   MCV 92.5 80.0 - 100.0 fL   MCH 29.0 26.0 - 34.0 pg   MCHC 31.4 30.0 - 36.0 g/dL   RDW 84.6 (H) 96.2 - 95.2 %   Platelets 231 150 - 400 K/uL   nRBC 0.0 0.0 - 0.2 %    Comment: Performed at [facility], [address], [place], [place] [address]  Cortisol-am, blood     Status: None   Collection Time: 08/28/23  6:42 AM  Result Value Ref Range   Cortisol - AM 9.2 6.7 - 22.6 ug/dL    Comment: Performed at [facility], [address] [place], [place] [address]  Vitamin B12     Status: Abnormal   Collection Time: 08/28/23  6:42 AM  Result Value Ref Range   Vitamin B-12 1,036 (H) 180 - 914 pg/mL    Comment: (NOTE) This assay is not validated for testing neonatal or myeloproliferative syndrome specimens for Vitamin B12 levels. Performed at [facility], [address], [place], [place] [address]   Folate     Status: None   Collection Time: 08/28/23  6:42 AM  Result Value Ref Range   Folate 11.5 >5.9 ng/mL    Comment: Performed at [facility], [address] [place], [place] [address]  Iron and TIBC     Status: Abnormal   Collection Time: 08/28/23  6:42 AM  Result Value Ref Range   Iron 32 28 - 170 ug/dL   TIBC 347 (L) 425 - 956 ug/dL   Saturation  Ratios 13 10.4 - 31.8 %   UIBC 214 ug/dL    Comment: Performed at [facility], [address], [place], [place] [address]  Ferritin     Status: None   Collection Time: 08/28/23  6:42 AM  Result Value Ref Range   Ferritin 45 11 - 307 ng/mL    Comment: Performed at [facility], [address] [place], [place] [address]  Reticulocytes     Status: Abnormal   Collection Time: 08/28/23  6:42 AM  Result Value Ref Range   Retic Ct Pct 2.8 0.4 - 3.1 %   RBC. 2.05 (L) 3.87 - 5.11 MIL/uL   Retic Count, Absolute 56.4 19.0 - 186.0 K/uL   Immature Retic Fract 26.4 (H) 2.3 - 15.9 %    Comment: Performed at [facility], [address] [place], [place] [address]  Hepatic function panel     Status: Abnormal   Collection Time: 08/28/23  6:42 AM  Result Value Ref Range   Total Protein 5.7 (L) 6.5 - 8.1 g/dL   Albumin 2.3 (L) 3.5 - 5.0 g/dL   AST 24 15 - 41 U/L   ALT 13 0 - 44 U/L   Alkaline Phosphatase 100 38 - 126 U/L   Total Bilirubin 1.1 <1.2 mg/dL   Bilirubin, Direct 0.2 0.0 - 0.2 mg/dL   Indirect Bilirubin 0.9 0.3 - 0.9 mg/dL    Comment: Performed at [facility], [address] [place], [place] [address]  Prepare RBC (crossmatch)     Status: None   Collection Time: 08/28/23  7:27 AM  Result Value Ref Range   Order Confirmation      ORDER PROCESSED BY BLOOD BANK Performed at [facility], [address] [place], [place] [address]   Sodium, urine, random     Status: None   Collection Time: 08/28/23 10:20 AM  Result Value Ref Range   Sodium, Ur 75 mmol/L    Comment: Performed at [facility], [address], [place], [place] [address]  Urinalysis, Complete w Microscopic -Urine, Clean Catch     Status: Abnormal   Collection Time: 08/28/23 10:20 AM  Result Value Ref Range   Color, Urine YELLOW YELLOW   APPearance CLEAR CLEAR   Specific Gravity, Urine 1.009 1.005 - 1.030    pH 7.0 5.0 - 8.0   Glucose, UA NEGATIVE NEGATIVE mg/dL   Hgb urine dipstick MODERATE (A) NEGATIVE   Bilirubin Urine NEGATIVE NEGATIVE   Ketones, ur NEGATIVE NEGATIVE mg/dL   Protein, ur NEGATIVE NEGATIVE mg/dL   Nitrite NEGATIVE NEGATIVE   Leukocytes,Ua LARGE (A) NEGATIVE   RBC / HPF 0-5 0 - 5 RBC/hpf   WBC, UA 11-20 0 - 5 WBC/hpf   Bacteria, UA RARE (A) NONE SEEN   Squamous Epithelial / HPF 0-5 0 - 5 /HPF    Comment: Performed at [facility], [address] [place], [place] [address]  Creatinine, urine, random     Status: None   Collection Time: 08/28/23 10:20 AM  Result Value Ref Range   Creatinine, Urine 39 mg/dL    Comment: Performed at [facility], [address], [place], [place] [address]   US RENAL Result Date: 08/28/2023 CLINICAL DATA:  Acute kidney injury EXAM: RENAL / URINARY TRACT ULTRASOUND COMPLETE COMPARISON:  CT abdomen and pelvis 01/15/2023 FINDINGS: Right Kidney: Renal measurements: 7.8 x 4.2 x 4.5 cm = volume: 78 mL. Echogenicity is increased. No mass or hydronephrosis visualized. Left Kidney: Renal measurements: 8.9 x 4.7 x 3.9 cm = volume: 86 mL. Echogenicity is increased. There is no hydronephrosis. There is a 3.3 x 3.4 x 3.0 cm simple cyst in the inferior pole of the left kidney. Bladder: Appears normal for degree of bladder distention. Other: None. IMPRESSION: 1. No hydronephrosis. 2. Increased renal echogenicity bilaterally consistent with  medical renal disease. 3. Simple cyst in the inferior pole of the left kidney. Electronically Signed   By: Darliss Cheney M.D.   On: 08/28/2023 00:07   DG CHEST PORT 1 VIEW Result Date: 08/27/2023 CLINICAL DATA:  Shortness of breath.  COPD exacerbation. EXAM: PORTABLE CHEST 1 VIEW COMPARISON:  05/05/2023 FINDINGS: Stable cardiomegaly. Aortic atherosclerotic calcification. Hazy bilateral airspace and interstitial opacities greatest in the lower lungs. Small bilateral pleural effusions.  Advanced arthritis both shoulders. IMPRESSION: Cardiomegaly with pulmonary edema and small bilateral pleural effusions. Electronically Signed   By: Minerva Fester M.D.   On: 08/27/2023 23:40    Pending Labs Unresulted Labs (From admission, onward)     Start     Ordered   08/28/23 1128  Osmolality, urine  Once,   R        08/28/23 1127   08/28/23 0500  TSH  Tomorrow morning,   R        08/27/23 2126   08/28/23 0500  Haptoglobin  Tomorrow morning,   R        08/27/23 2128   08/28/23 0500  Protein electrophoresis, serum  Tomorrow morning,   R        08/27/23 2146            Vitals/Pain Today's Vitals   08/28/23 1240 08/28/23 1248 08/28/23 1306 08/28/23 1606  BP: 137/74 (!) 137/47 112/83 (!) 147/49  Pulse: 67 65 72 60  Resp: 17 16 16 16   Temp: 98.2 F (36.8 C)  98 F (36.7 C) 98.2 F (36.8 C)  TempSrc: Oral   Oral  SpO2:  100% 100% 99%  Weight:      Height:      PainSc:        Isolation Precautions No active isolations  Medications Medications  polyethylene glycol (MIRALAX / GLYCOLAX) packet 17 g (17 g Oral Given 08/28/23 0300)  sodium chloride flush (NS) 0.9 % injection 3 mL (3 mLs Intravenous Given 08/28/23 1031)  pantoprazole (PROTONIX) injection 40 mg (40 mg Intravenous Given 08/28/23 1030)  ciprofloxacin (CIPRO) tablet 250 mg (250 mg Oral Given 08/27/23 2233)  acetaminophen (TYLENOL) tablet 650 mg (650 mg Oral Given 08/28/23 0140)  Gerhardt's butt cream ( Topical Given 08/28/23 1031)  atorvastatin (LIPITOR) tablet 10 mg (10 mg Oral Given 08/28/23 1141)  diclofenac Sodium (VOLTAREN) 1 % topical gel 4 g (4 g Topical Given 08/28/23 1142)  gabapentin (NEURONTIN) capsule 300 mg (300 mg Oral Given 08/28/23 1141)  levothyroxine (SYNTHROID) tablet 100 mcg (has no administration in time range)  spironolactone (ALDACTONE) tablet 12.5 mg (12.5 mg Oral Given 08/28/23 1141)  0.9 %  sodium chloride infusion (has no administration in time range)  phytonadione (VITAMIN K)  tablet 2.5 mg (2.5 mg Oral Given 08/27/23 2048)  pantoprazole (PROTONIX) injection 80 mg (80 mg Intravenous Given 08/27/23 2039)  potassium chloride (KLOR-CON) packet 40 mEq (40 mEq Oral Given 08/27/23 2233)  0.9 %  sodium chloride infusion (Manually program via Guardrails IV Fluids) ( Intravenous New Bag/Given 08/28/23 1251)    Mobility walks with person assist     Focused Assessments Gastro, positive occult and hgb 5.8   R Recommendations: See Admitting Provider Note  Report given to:   Additional Notes: Received 1 unit, pt is at baseline, asks the same question repetitively, uses bedside commode very weak

## 2023-08-28 NOTE — Progress Notes (Addendum)
PROGRESS NOTE    [name]  UJW:[id number] DOB: 1927-11-23 DOA: 08/27/2023 PCP: Camie Patience, FNP   Brief Narrative:  HPI: [name] is a 89 y.o. female with medical history significant of chronic use of 2 L/min of supplementary oxygen.  Patient has also had recurrent pulmonary embolism.  The last episode was more than 10 years ago.  Patient is chronically anticoagulated with warfarin.   Patient is alert and awake and partakes in the history however principally the history is provided by the daughter at the bedside.  Patient tends to have falls at home has had about 6 falls this year that the daughter can count.  However there are instances when the patient is by herself at home.  There is concern that the patient try and tends to minimize her symptoms.  Patient has had various bruising develop on her lower extremity that have been attributed to her falls.  Patient also had self-limiting episode of left nare bleed a few days ago.  Patient denies any vomiting denies any diarrhea.  No abdominal pain.  Patient was diagnosed with a urinary tract infection about 3 to 4 days ago.  Started treatment with doxycycline subsequently changed to ciprofloxacin.  Patient currently reports no dysuria no fevers no rigors.  Patient also was noted to have worse hyponatremia than her baseline and given a 5-day course of sodium chloride tablets.   Patient had workup done at primary care office yesterday with finding of hemoglobin of 7.1.  Patient referred to the ER for workup today.  No history of chest pain, new shortness of breath presyncope.  Patient does have chronic shortness of breath.  Assessment & Plan:   Principal Problem:   Anemia Active Problems:   Hyponatremia   UTI (urinary tract infection)   Chronic obstructive lung disease (HCC)   AKI on CKD-3B   Pressure injury of back, stage 2 (HCC)  Acute blood loss anemia secondary to upper GI bleed secondary to Coumadin use/hypercoagulable  state/supratherapeutic INR: This is rather incidentally found.  Patient has had several sites of blood loss including bruising in legs due to her fall, recent nare bleed.  No history of melena or hematochezia but FOBT positive indicating possible contributing from upper GI bleed secondary to anticoagulation/Coumadin use.  INR was supratherapeutic at 4.2 upon arrival, she received vitamin K and INR down to 3.5 today.  However hemoglobin dropped to under 6 from 7.9 at presentation, 1 unit of PRBC transfusion is ordered.  Admitting hospitalist discussed with the patient and daughter and they would not want a GI referral for potential colonoscopy or endoscopy for this.  She received 1 dose of Protonix yesterday, will resume Protonix 40 mg IV twice daily.  We will shoot to keep her hemoglobin over 7 and hold Coumadin for now.  I tried calling daughter to clarify regarding GI consultation but left a voicemail.  Addendum/update 1:32 PM: Received a phone call back from her daughter.  She said that she never said that she would not want GI on board or EGD for the patient, she was opposed to having colonoscopy.  She is okay with GI seeing the patient now that she is having acute blood loss anemia secondary to presumed GI bleed.  I have consulted Dr. Ewing Schlein.  History of recurrent PEs: Coumadin on hold for active GI bleed.   UTI (urinary tract infection) This was diagnosed prior to admison.  She was initially treated with doxycycline and then ciprofloxacin.  UA  upon arrival does not indicate infection however ciprofloxacin was continued.   Chronic hyponatremia: Upon chart review, it appears that she has chronic hyponatremia ranging anywhere from 120-132.  Per report patient had worse hyponatremia a few days ago and was treated with oral sodium chloride tablets.  Sodium hovering around 128-132.  Serum osmolarity has been checked.  I will check urine osmolarity and urine sodium.   Pressure injury of back, stage 2  (HCC) Patient was noted to have pressure injury stage II of the coccygeal area.  Further daughter shows pressure injury photo of left heel that is currently dressed.  We will request wound care evaluation.  Turn and position patient.   AKI on CKD-3B rule out.  CKD stage IIIa-3B Chart review indicates that patient's baseline hemoglobin ranges from 1-1.1 hovering between CKD stage IIIa and IIIb, she presented with 1.34, does not meet criteria for AKI.  She is almost at her baseline.  Avoid nephrotoxic agents.  Clinically she appears slightly dehydrated.  I will start her on normal saline gently at 75 cc/h for 20 hours and repeat labs in the morning.  Renal ultrasound shows medical renal disease.  No obstruction no hydronephrosis.  Chronic obstructive lung disease (HCC) [place] 2lpm oxygen.  Although chest x-ray shows pulmonary edema but clinically she does not appear to be fluid overloaded and lungs are clear to auscultation.  She is currently on 2 L of oxygen.  Recurrent falls: To be seen by PT OT.  DVT prophylaxis: SCDs Start: 08/27/23 2029   Code Status: Limited: Do not attempt resuscitation (DNR) -DNR-LIMITED -Do Not Intubate/DNI   Family Communication:  None present at bedside.  Called and left voicemail for the daughter.  Status is: Inpatient Remains inpatient appropriate because: Anemia, receiving blood transfusion.   Estimated body mass index is 27.34 kg/m as calculated from the following:   Height as of this encounter: 5\' 3"  (1.6 m).   Weight as of this encounter: 70 kg.  Pressure Injury 01/16/23 Heel Left Unstageable - Full thickness tissue loss in which the base of the injury is covered by slough (yellow, tan, gray, green or brown) and/or eschar (tan, brown or black) in the wound bed. (Active)  01/16/23 0022  Location: Heel  Location Orientation: Left  Staging: Unstageable - Full thickness tissue loss in which the base of the injury is covered by slough (yellow, tan, gray, green  or brown) and/or eschar (tan, brown or black) in the wound bed.  Wound Description (Comments):   Present on Admission: Yes     Pressure Injury 01/16/23 Sacrum Deep Tissue Pressure Injury - Purple or maroon localized area of discolored intact skin or blood-filled blister due to damage of underlying soft tissue from pressure and/or shear. (Active)  01/16/23 0025  Location: Sacrum  Location Orientation:   Staging: Deep Tissue Pressure Injury - Purple or maroon localized area of discolored intact skin or blood-filled blister due to damage of underlying soft tissue from pressure and/or shear.  Wound Description (Comments):   Present on Admission: Yes     Pressure Injury 05/06/23 Toe (Comment  which one) Anterior;Right Stage 2 -  Partial thickness loss of dermis presenting as a shallow open injury with a red, pink wound bed without slough. Full thickness wound, NOT a pressure injury (Active)  05/06/23 1500  Location: Toe (Comment  which one)  Location Orientation: Anterior;Right (second toe)  Staging: Stage 2 -  Partial thickness loss of dermis presenting as a shallow open injury with a  red, pink wound bed without slough.  Wound Description (Comments): Full thickness wound, NOT a pressure injury  Present on Admission: Yes     Pressure Injury 05/06/23 Toe (Comment  which one) Anterior;Left Unstageable - Full thickness tissue loss in which the base of the injury is covered by slough (yellow, tan, gray, green or brown) and/or eschar (tan, brown or black) in the wound bed. full t (Active)  05/06/23 1500  Location: Toe (Comment  which one)  Location Orientation: Anterior;Left  Staging: Unstageable - Full thickness tissue loss in which the base of the injury is covered by slough (yellow, tan, gray, green or brown) and/or eschar (tan, brown or black) in the wound bed.  Wound Description (Comments): full thickness wound, NOT a pressure injury  Present on Admission: Yes     Pressure Injury 08/28/23  Buttocks Right;Left Stage 2 -  Partial thickness loss of dermis presenting as a shallow open injury with a red, pink wound bed without slough. R upper buttock 0.5 cm x 0.5 cm x 0.1 cm; L upper buttock 1 cm x 0.5 cm x 0.1 cm (Active)  08/28/23   Location: Buttocks  Location Orientation: Right;Left  Staging: Stage 2 -  Partial thickness loss of dermis presenting as a shallow open injury with a red, pink wound bed without slough.  Wound Description (Comments): R upper buttock 0.5 cm x 0.5 cm x 0.1 cm; L upper buttock 1 cm x 0.5 cm x 0.1 cm  Present on Admission: Yes   Nutritional Assessment: Body mass index is 27.34 kg/m.[name] Kitchen Seen by dietician.  I agree with the assessment and plan as outlined below: Nutrition Status:        . Skin Assessment: I have examined the patient's skin and I agree with the wound assessment as performed by the wound care RN as outlined below: Pressure Injury 01/16/23 Heel Left Unstageable - Full thickness tissue loss in which the base of the injury is covered by slough (yellow, tan, gray, green or brown) and/or eschar (tan, brown or black) in the wound bed. (Active)  01/16/23 0022  Location: Heel  Location Orientation: Left  Staging: Unstageable - Full thickness tissue loss in which the base of the injury is covered by slough (yellow, tan, gray, green or brown) and/or eschar (tan, brown or black) in the wound bed.  Wound Description (Comments):   Present on Admission: Yes     Pressure Injury 01/16/23 Sacrum Deep Tissue Pressure Injury - Purple or maroon localized area of discolored intact skin or blood-filled blister due to damage of underlying soft tissue from pressure and/or shear. (Active)  01/16/23 0025  Location: Sacrum  Location Orientation:   Staging: Deep Tissue Pressure Injury - Purple or maroon localized area of discolored intact skin or blood-filled blister due to damage of underlying soft tissue from pressure and/or shear.  Wound Description (Comments):    Present on Admission: Yes     Pressure Injury 05/06/23 Toe (Comment  which one) Anterior;Right Stage 2 -  Partial thickness loss of dermis presenting as a shallow open injury with a red, pink wound bed without slough. Full thickness wound, NOT a pressure injury (Active)  05/06/23 1500  Location: Toe (Comment  which one)  Location Orientation: Anterior;Right (second toe)  Staging: Stage 2 -  Partial thickness loss of dermis presenting as a shallow open injury with a red, pink wound bed without slough.  Wound Description (Comments): Full thickness wound, NOT a pressure injury  Present on Admission: Yes  Pressure Injury 05/06/23 Toe (Comment  which one) Anterior;Left Unstageable - Full thickness tissue loss in which the base of the injury is covered by slough (yellow, tan, gray, green or brown) and/or eschar (tan, brown or black) in the wound bed. full t (Active)  05/06/23 1500  Location: Toe (Comment  which one)  Location Orientation: Anterior;Left  Staging: Unstageable - Full thickness tissue loss in which the base of the injury is covered by slough (yellow, tan, gray, green or brown) and/or eschar (tan, brown or black) in the wound bed.  Wound Description (Comments): full thickness wound, NOT a pressure injury  Present on Admission: Yes     Pressure Injury 08/28/23 Buttocks Right;Left Stage 2 -  Partial thickness loss of dermis presenting as a shallow open injury with a red, pink wound bed without slough. R upper buttock 0.5 cm x 0.5 cm x 0.1 cm; L upper buttock 1 cm x 0.5 cm x 0.1 cm (Active)  08/28/23   Location: Buttocks  Location Orientation: Right;Left  Staging: Stage 2 -  Partial thickness loss of dermis presenting as a shallow open injury with a red, pink wound bed without slough.  Wound Description (Comments): R upper buttock 0.5 cm x 0.5 cm x 0.1 cm; L upper buttock 1 cm x 0.5 cm x 0.1 cm  Present on Admission: Yes    Consultants:  None  Procedures:   None  Antimicrobials:  Anti-infectives (From admission, onward)    Start     Dose/Rate Route Frequency Ordered Stop   08/27/23 2215  ciprofloxacin (CIPRO) tablet 250 mg        250 mg Oral Daily at 10 pm 08/27/23 2212 08/30/23 2159   08/27/23 2145  ciprofloxacin (CIPRO) tablet 250 mg  Status:  Discontinued        250 mg Oral 2 times daily 08/27/23 2144 08/27/23 2211         Subjective: Patient seen and examined, she is in the ED.  She is complaining of generalized weakness and generalized bodyaches.  No specific complaint.  She is alert and oriented to self and place.  She asked me to call her daughter.  Objective: Vitals:   08/28/23 0600 08/28/23 0645 08/28/23 0920 08/28/23 0922  BP: (!) 129/46   (!) 151/52  Pulse: 67 76  76  Resp: (!) 23 19  16   Temp:   98.4 F (36.9 C) 98.4 F (36.9 C)  TempSrc:   Oral Oral  SpO2: 94% 100%  99%  Weight:      Height:       No intake or output data in the 24 hours ending 08/28/23 1116 Filed Weights   08/27/23 1751  Weight: 70 kg    Examination:  General exam: Appears calm and comfortable  Respiratory system: Clear to auscultation. Respiratory effort normal. Cardiovascular system: S1 & S2 heard, RRR. No JVD, murmurs, rubs, gallops or clicks. No pedal edema. Gastrointestinal system: Abdomen is nondistended, soft and nontender. No organomegaly or masses felt. Normal bowel sounds heard. Central nervous system: Alert and oriented x 2. No focal neurological deficits. Extremities: Symmetric 5 x 5 power. Skin: No rashes, lesions or ulcers  Data Reviewed: I have personally reviewed following labs and imaging studies  CBC: Recent Labs  Lab 08/27/23 1806 08/28/23 0642  WBC 7.8 5.9  NEUTROABS 5.5  --   HGB 7.9* 5.8*  HCT 25.1* 18.5*  MCV 93.7 92.5  PLT 291 231   Basic Metabolic Panel: Recent Labs  Lab  08/27/23 1806 08/28/23 0642  NA 132* 128*  K 3.3* 3.9  CL 94* 94*  CO2 28 26  GLUCOSE 109* 90  BUN 19 18  CREATININE  1.34* 1.30*  CALCIUM 9.2 8.5*   GFR: Estimated Creatinine Clearance: 25.4 mL/min (A) (by C-G formula based on SCr of 1.3 mg/dL (H)). Liver Function Tests: Recent Labs  Lab 08/27/23 1806 08/28/23 0642  AST 30 24  ALT 16 13  ALKPHOS 135* 100  BILITOT 0.8 1.1  PROT 7.3 5.7*  ALBUMIN 3.0* 2.3*   No results for input(s): "LIPASE", "AMYLASE" in the last 168 hours. No results for input(s): "AMMONIA" in the last 168 hours. Coagulation Profile: Recent Labs  Lab 08/27/23 1806 08/28/23 0642  INR 4.2* 3.5*   Cardiac Enzymes: No results for input(s): "CKTOTAL", "CKMB", "CKMBINDEX", "TROPONINI" in the last 168 hours. BNP (last 3 results) No results for input(s): "PROBNP" in the last 8760 hours. HbA1C: No results for input(s): "HGBA1C" in the last 72 hours. CBG: No results for input(s): "GLUCAP" in the last 168 hours. Lipid Profile: No results for input(s): "CHOL", "HDL", "LDLCALC", "TRIG", "CHOLHDL", "LDLDIRECT" in the last 72 hours. Thyroid Function Tests: No results for input(s): "TSH", "T4TOTAL", "FREET4", "T3FREE", "THYROIDAB" in the last 72 hours. Anemia Panel: Recent Labs    08/28/23 0642  VITAMINB12 1,036*  FOLATE 11.5  FERRITIN 45  TIBC 246*  IRON 32  RETICCTPCT 2.8   Sepsis Labs: No results for input(s): "PROCALCITON", "LATICACIDVEN" in the last 168 hours.  No results found for this or any previous visit (from the past 240 hours).   Radiology Studies: US RENAL Result Date: 08/28/2023 CLINICAL DATA:  Acute kidney injury EXAM: RENAL / URINARY TRACT ULTRASOUND COMPLETE COMPARISON:  CT abdomen and pelvis 01/15/2023 FINDINGS: Right Kidney: Renal measurements: 7.8 x 4.2 x 4.5 cm = volume: 78 mL. Echogenicity is increased. No mass or hydronephrosis visualized. Left Kidney: Renal measurements: 8.9 x 4.7 x 3.9 cm = volume: 86 mL. Echogenicity is increased. There is no hydronephrosis. There is a 3.3 x 3.4 x 3.0 cm simple cyst in the inferior pole of the left kidney. Bladder:  Appears normal for degree of bladder distention. Other: None. IMPRESSION: 1. No hydronephrosis. 2. Increased renal echogenicity bilaterally consistent with medical renal disease. 3. Simple cyst in the inferior pole of the left kidney. Electronically Signed   By: Darliss Cheney M.D.   On: 08/28/2023 00:07   DG CHEST PORT 1 VIEW Result Date: 08/27/2023 CLINICAL DATA:  Shortness of breath.  COPD exacerbation. EXAM: PORTABLE CHEST 1 VIEW COMPARISON:  05/05/2023 FINDINGS: Stable cardiomegaly. Aortic atherosclerotic calcification. Hazy bilateral airspace and interstitial opacities greatest in the lower lungs. Small bilateral pleural effusions. Advanced arthritis both shoulders. IMPRESSION: Cardiomegaly with pulmonary edema and small bilateral pleural effusions. Electronically Signed   By: Minerva Fester M.D.   On: 08/27/2023 23:40    Scheduled Meds:  sodium chloride   Intravenous Once   atorvastatin  10 mg Oral Daily   ciprofloxacin  250 mg Oral Q2200   diclofenac Sodium  4 g Topical QID   gabapentin  300 mg Oral BID   Gerhardt's butt cream   Topical BID   [START ON 08/29/2023] levothyroxine  100 mcg Oral QAC breakfast   pantoprazole (PROTONIX) IV  40 mg Intravenous Q12H   sodium chloride flush  3 mL Intravenous Q12H   spironolactone  12.5 mg Oral Daily   Continuous Infusions:   LOS: 1 day   Hughie Closs, MD [facility]  08/28/2023, 11:16 AM   *Please note that this is a verbal dictation therefore any spelling or grammatical errors are due to the "Dragon Medical One" system interpretation.  Please page via Amion and do not message via secure chat for urgent patient care matters. Secure chat can be used for non urgent patient care matters.  How to contact the [facility] Attending or Consulting provider 7A - 7P or covering provider during after hours 7P -7A, for this patient?  Check the care team in [facility] and look for a) attending/consulting [facility] provider listed and b) the [facility] team listed. Page  or secure chat 7A-7P. Log into [email or web address] and use 's universal password to access. If you do not have the password, please contact the hospital operator. Locate the [facility] provider you are looking for under [facility] and page to a number that you can be directly reached. If you still have difficulty reaching the provider, please page the [facility] (Director on Call) for the Hospitalists listed on amion for assistance.

## 2023-08-28 NOTE — Consult Note (Signed)
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  Full thickness ulcers L great, L 2nd digit, R 2nd digit likely r/t arterial insufficiency, L posterior heel full thickness ? R/t friction and pressure  2.  L anterior leg below knee ull thickness related to trauma; Area of dark hemorrhagic material to L anterior leg from trauma  2.  Stage 2 Pressure Injury B upper buttocks surrounding by dry scaly skin  Pressure Injury POA: Yes Measurement: 1.  L great anterior toe 1 cm x 2 cm x 0.2 cm 80% pink 20% yellow; L 2nd digit anterior 80% yellow 20% pink 0.5 cm x 1 cm x 0.2 cm 2. L posterior heel 1 cm x 0.8 cm 100% dry eschar  3.  R 2nd digit full thickness 0.5 cm x 1 cm x 0.1 cm 100% pink moist  4.  Left anterior lower leg (below knee) 0.5 cm x 0.5 cm x 0.1 cm 100% pink moist; area of dark hemorrhagic tissue (skin intact) to left anterior leg 5 cm x 2 cm  5.  Stage 2 R upper buttock 0.5 cm x 0.5 cm x 0.1 cm 100% pink moist; L upper buttock 1 cm x 0.5 cm x 0.1 cm  Wound bed: as above  Drainage (amount, consistency, odor) minimal serosanguinous to toe wounds; L heel dry  Periwound: dry scaly skin feet; dry brown peeling skin to sacrum and buttocks  Dressing procedure/placement/frequency:  Clean ulcers to L great toe, B 2nd digits, L posterior heel, L anterior leg with NS, apply silver hydrofiber [place] [phone]) cut to fit wound beds daily, cover with dry gauze and secure with silicone foam or Kerlix roll gauze and tape.   Clean B buttocks with soap and water, apply Gerhardt's butt cream 2 times a day and prn soiling.  Cover stage 2 Pressure  Injuries with silicone foam, lift daily to assess. Change foam q3 days and prn soiling.  May cover hemorrhagic area to L anterior leg with Xeroform gauze [place] [phone]) daily and secure with silicone foam. Lift foam daily to change Xeroform.  Change foam q3 days and prn soiling.  POC discussed with patient (who appears confused, states her daughter handles all her wound care). Secure chat sent to primary RN.    WOC team will not follow.  Re-consult if further needs arise.   Thank you,    Priscella Mann MSN, RN-BC, [employer] [phone]

## 2023-08-29 DIAGNOSIS — D649 Anemia, unspecified: Secondary | ICD-10-CM | POA: Diagnosis not present

## 2023-08-29 LAB — BASIC METABOLIC PANEL
Anion gap: 7 (ref 5–15)
BUN: 14 mg/dL (ref 8–23)
CO2: 26 mmol/L (ref 22–32)
Calcium: 8.6 mg/dL — ABNORMAL LOW (ref 8.9–10.3)
Chloride: 95 mmol/L — ABNORMAL LOW (ref 98–111)
Creatinine, Ser: 1.25 mg/dL — ABNORMAL HIGH (ref 0.44–1.00)
GFR, Estimated: 40 mL/min — ABNORMAL LOW (ref >60)
Glucose, Bld: 87 mg/dL (ref 70–99)
Potassium: 4.1 mmol/L (ref 3.5–5.1)
Sodium: 128 mmol/L — ABNORMAL LOW (ref 135–145)

## 2023-08-29 LAB — CBC WITH DIFFERENTIAL/PLATELET
Abs Immature Granulocytes: 0.04 10*3/uL (ref 0.00–0.07)
Abs Immature Granulocytes: 0.04 10*3/uL (ref 0.00–0.07)
Basophils Absolute: 0 10*3/uL (ref 0.0–0.1)
Basophils Absolute: 0 10*3/uL (ref 0.0–0.1)
Basophils Relative: 1 %
Basophils Relative: 1 %
Eosinophils Absolute: 0.3 10*3/uL (ref 0.0–0.5)
Eosinophils Absolute: 0.3 10*3/uL (ref 0.0–0.5)
Eosinophils Relative: 5 %
Eosinophils Relative: 4 %
HCT: 23.6 % — ABNORMAL LOW (ref 36.0–46.0)
HCT: 25.7 % — ABNORMAL LOW (ref 36.0–46.0)
Hemoglobin: 7.3 g/dL — ABNORMAL LOW (ref 12.0–15.0)
Hemoglobin: 8.2 g/dL — ABNORMAL LOW (ref 12.0–15.0)
Immature Granulocytes: 1 %
Immature Granulocytes: 1 %
Lymphocytes Relative: 14 %
Lymphocytes Relative: 12 %
Lymphs Abs: 0.7 10*3/uL (ref 0.7–4.0)
Lymphs Abs: 0.8 10*3/uL (ref 0.7–4.0)
MCH: 28.9 pg (ref 26.0–34.0)
MCH: 29.4 pg (ref 26.0–34.0)
MCHC: 30.9 g/dL (ref 30.0–36.0)
MCHC: 31.9 g/dL (ref 30.0–36.0)
MCV: 93.3 fL (ref 80.0–100.0)
MCV: 92.1 fL (ref 80.0–100.0)
Monocytes Absolute: 0.6 10*3/uL (ref 0.1–1.0)
Monocytes Absolute: 0.6 10*3/uL (ref 0.1–1.0)
Monocytes Relative: 11 %
Monocytes Relative: 9 %
Neutro Abs: 3.7 10*3/uL (ref 1.7–7.7)
Neutro Abs: 4.8 10*3/uL (ref 1.7–7.7)
Neutrophils Relative %: 68 %
Neutrophils Relative %: 73 %
Platelets: 221 10*3/uL (ref 150–400)
Platelets: 244 10*3/uL (ref 150–400)
RBC: 2.53 MIL/uL — ABNORMAL LOW (ref 3.87–5.11)
RBC: 2.79 MIL/uL — ABNORMAL LOW (ref 3.87–5.11)
RDW: 15.2 % (ref 11.5–15.5)
RDW: 15.2 % (ref 11.5–15.5)
WBC: 5.4 10*3/uL (ref 4.0–10.5)
WBC: 6.5 10*3/uL (ref 4.0–10.5)
nRBC: 0 % (ref 0.0–0.2)
nRBC: 0 % (ref 0.0–0.2)

## 2023-08-29 LAB — TSH: TSH: 1.207 u[IU]/mL (ref 0.350–4.500)

## 2023-08-29 LAB — HAPTOGLOBIN: Haptoglobin: 242 mg/dL (ref 41–333)

## 2023-08-29 MED ORDER — CARMEX CLASSIC LIP BALM EX OINT
1.0000 | TOPICAL_OINTMENT | CUTANEOUS | Status: DC | PRN
  Filled 2023-08-29 (×2): qty 10

## 2023-08-29 MED ORDER — MELATONIN 5 MG PO TABS
5.0000 mg | ORAL_TABLET | Freq: Once | ORAL | Status: AC
Start: 2023-08-29 — End: 2023-08-29
  Administered 2023-08-29: 5 mg via ORAL
  Filled 2023-08-29: qty 1

## 2023-08-29 MED ORDER — MELATONIN 5 MG PO TABS
5.0000 mg | ORAL_TABLET | Freq: Every evening | ORAL | Status: AC | PRN
  Administered 2023-08-29 – 2023-08-31 (×2): 5 mg via ORAL
  Filled 2023-08-29 (×2): qty 1

## 2023-08-29 NOTE — Plan of Care (Addendum)
Patient oriented to self, knows she is in the hospital but repeatedly asks why she is here, wants to speak directly with the provider (who saw her at bedside this morning) and wants to go home. Educated about her current labs and safety levels, but she repeated the same questions. Patient asked to speak with her daughter by phone, RN called at bedside and they spoke. Up to chair for  meals. Thank you IV Team for new placement.   Problem: Education: Goal: Knowledge of General Education information will improve Description: Including pain rating scale, medication(s)/side effects and non-pharmacologic comfort measures Outcome: Progressing   Problem: Health Behavior/Discharge Planning: Goal: Ability to manage health-related needs will improve Outcome: Progressing   Problem: Clinical Measurements: Goal: Ability to maintain clinical measurements within normal limits will improve Outcome: Progressing Goal: Will remain free from infection Outcome: Progressing Goal: Diagnostic test results will improve Outcome: Progressing Goal: Respiratory complications will improve Outcome: Progressing Goal: Cardiovascular complication will be avoided Outcome: Progressing   Problem: Activity: Goal: Risk for activity intolerance will decrease Outcome: Progressing   Problem: Nutrition: Goal: Adequate nutrition will be maintained Outcome: Progressing   Problem: Coping: Goal: Level of anxiety will decrease Outcome: Progressing   Problem: Elimination: Goal: Will not experience complications related to bowel motility Outcome: Progressing Goal: Will not experience complications related to urinary retention Outcome: Progressing   Problem: Pain Management: Goal: General experience of comfort will improve Outcome: Progressing   Problem: Safety: Goal: Ability to remain free from injury will improve Outcome: Progressing   Problem: Skin Integrity: Goal: Risk for impaired skin integrity will  decrease Outcome: Progressing

## 2023-08-29 NOTE — Plan of Care (Signed)

## 2023-08-29 NOTE — Progress Notes (Signed)
PROGRESS NOTE    [name]  ZDG:[id number] DOB: February 08, 1928 DOA: 08/27/2023 PCP: Camie Patience, FNP   Brief Narrative:  HPI: [name] is a 89 y.o. female with medical history significant of chronic use of 2 L/min of supplementary oxygen.  Patient has also had recurrent pulmonary embolism.  The last episode was more than 10 years ago.  Patient is chronically anticoagulated with warfarin.   Patient is alert and awake and partakes in the history however principally the history is provided by the daughter at the bedside.  Patient tends to have falls at home has had about 6 falls this year that the daughter can count.  However there are instances when the patient is by herself at home.  There is concern that the patient try and tends to minimize her symptoms.  Patient has had various bruising develop on her lower extremity that have been attributed to her falls.  Patient also had self-limiting episode of left nare bleed a few days ago.  Patient denies any vomiting denies any diarrhea.  No abdominal pain.  Patient was diagnosed with a urinary tract infection about 3 to 4 days ago.  Started treatment with doxycycline subsequently changed to ciprofloxacin.  Patient currently reports no dysuria no fevers no rigors.  Patient also was noted to have worse hyponatremia than her baseline and given a 5-day course of sodium chloride tablets.   Patient had workup done at primary care office yesterday with finding of hemoglobin of 7.1.  Patient referred to the ER for workup today.  No history of chest pain, new shortness of breath presyncope.  Patient does have chronic shortness of breath.  Assessment & Plan:   Principal Problem:   Anemia Active Problems:   Hyponatremia   UTI (urinary tract infection)   Chronic obstructive lung disease (HCC)   AKI on CKD-3B   Pressure injury of back, stage 2 (HCC)  Acute blood loss anemia secondary to upper GI bleed secondary to Coumadin use/hypercoagulable  state/supratherapeutic INR: This is rather incidentally found.  Patient has had several sites of blood loss including bruising in legs due to her fall, recent nare bleed.  No history of melena or hematochezia but FOBT positive indicating possible contribution from upper GI bleed secondary to anticoagulation/Coumadin use.  INR was supratherapeutic at 4.2 upon arrival, she received vitamin K and INR down to 3.5 today.  However hemoglobin dropped to under 6 from 7.9 at presentation, 1 unit of PRBC transfusion is ordered.  Admitting hospitalist discussed with the patient and daughter and they would not want a GI referral for potential colonoscopy or endoscopy for this.  I personally spoke to the daughter.  She said that she never said that she would not want GI on board or EGD for the patient, she was opposed to having colonoscopy.  She was okay with GI seeing the patient, Dr. Ewing Schlein saw patient, did not recommend any intervention.  Recommended holding anticoagulation which we are already doing.  Patient's hemoglobin improved to 8.3 after transfusion but dropped to 7.3 today.  Will be checking every 12 hours and transfuse to keep hemoglobin over 7.  She remains on Protonix.  History of recurrent PEs: Coumadin on hold for active GI bleed.   UTI (urinary tract infection) This was diagnosed prior to admison.  She was initially treated with doxycycline and then ciprofloxacin.  UA upon arrival does not indicate infection however ciprofloxacin was continued.   Chronic hyponatremia: Upon chart review, it appears that  she has chronic hyponatremia ranging anywhere from 120-132.  Per report patient had worse hyponatremia a few days ago and was treated with oral sodium chloride tablets.  Sodium hovering around 128-132.    Pressure injury of back, stage 2 (HCC) Patient was noted to have pressure injury stage II of the coccygeal area.  Further daughter shows pressure injury photo of left heel that is currently dressed.   We will request wound care evaluation.  Turn and position patient.   AKI on CKD-3B rule out.  CKD stage IIIa-3B Chart review indicates that patient's baseline hemoglobin ranges from 1-1.1 hovering between CKD stage IIIa and IIIb, she presented with 1.34, does not meet criteria for AKI.  She is almost at her baseline.  Avoid nephrotoxic agents.  Clinically she appears slightly dehydrated.  I will start her on normal saline gently at 75 cc/h for 20 hours and repeat labs in the morning.  Renal ultrasound shows medical renal disease.  No obstruction no hydronephrosis.  Chronic obstructive lung disease (HCC) [place] 2lpm oxygen.  Although chest x-ray shows pulmonary edema but clinically she does not appear to be fluid overloaded and lungs are clear to auscultation.  She is currently on 2 L of oxygen.  Recurrent falls: To be seen by PT OT.  DVT prophylaxis: SCDs Start: 08/27/23 2029   Code Status: Limited: Do not attempt resuscitation (DNR) -DNR-LIMITED -Do Not Intubate/DNI   Family Communication:  None present at bedside.   Status is: Inpatient Remains inpatient appropriate because: Anemia, drop in blood, to be seen by PT OT.   Estimated body mass index is 27.34 kg/m as calculated from the following:   Height as of this encounter: 5\' 3"  (1.6 m).   Weight as of this encounter: 70 kg.  Pressure Injury 01/16/23 Heel Left Unstageable - Full thickness tissue loss in which the base of the injury is covered by slough (yellow, tan, gray, green or brown) and/or eschar (tan, brown or black) in the wound bed. (Active)  01/16/23 0022  Location: Heel  Location Orientation: Left  Staging: Unstageable - Full thickness tissue loss in which the base of the injury is covered by slough (yellow, tan, gray, green or brown) and/or eschar (tan, brown or black) in the wound bed.  Wound Description (Comments):   Present on Admission: Yes     Pressure Injury 01/16/23 Sacrum Deep Tissue Pressure Injury - Purple or  maroon localized area of discolored intact skin or blood-filled blister due to damage of underlying soft tissue from pressure and/or shear. (Active)  01/16/23 0025  Location: Sacrum  Location Orientation:   Staging: Deep Tissue Pressure Injury - Purple or maroon localized area of discolored intact skin or blood-filled blister due to damage of underlying soft tissue from pressure and/or shear.  Wound Description (Comments):   Present on Admission: Yes     Pressure Injury 05/06/23 Toe (Comment  which one) Anterior;Right Stage 2 -  Partial thickness loss of dermis presenting as a shallow open injury with a red, pink wound bed without slough. Full thickness wound, NOT a pressure injury (Active)  05/06/23 1500  Location: Toe (Comment  which one)  Location Orientation: Anterior;Right (second toe)  Staging: Stage 2 -  Partial thickness loss of dermis presenting as a shallow open injury with a red, pink wound bed without slough.  Wound Description (Comments): Full thickness wound, NOT a pressure injury  Present on Admission: Yes     Pressure Injury 05/06/23 Toe (Comment  which one) Anterior;Left  Unstageable - Full thickness tissue loss in which the base of the injury is covered by slough (yellow, tan, gray, green or brown) and/or eschar (tan, brown or black) in the wound bed. full t (Active)  05/06/23 1500  Location: Toe (Comment  which one)  Location Orientation: Anterior;Left  Staging: Unstageable - Full thickness tissue loss in which the base of the injury is covered by slough (yellow, tan, gray, green or brown) and/or eschar (tan, brown or black) in the wound bed.  Wound Description (Comments): full thickness wound, NOT a pressure injury  Present on Admission: Yes     Pressure Injury 08/28/23 Buttocks Right;Left Stage 2 -  Partial thickness loss of dermis presenting as a shallow open injury with a red, pink wound bed without slough. R upper buttock 0.5 cm x 0.5 cm x 0.1 cm; L upper buttock 1 cm x  0.5 cm x 0.1 cm (Active)  08/28/23   Location: Buttocks  Location Orientation: Right;Left  Staging: Stage 2 -  Partial thickness loss of dermis presenting as a shallow open injury with a red, pink wound bed without slough.  Wound Description (Comments): R upper buttock 0.5 cm x 0.5 cm x 0.1 cm; L upper buttock 1 cm x 0.5 cm x 0.1 cm  Present on Admission: Yes   Nutritional Assessment: Body mass index is 27.34 kg/m.[name] Kitchen Seen by dietician.  I agree with the assessment and plan as outlined below: Nutrition Status:        . Skin Assessment: I have examined the patient's skin and I agree with the wound assessment as performed by the wound care RN as outlined below: Pressure Injury 01/16/23 Heel Left Unstageable - Full thickness tissue loss in which the base of the injury is covered by slough (yellow, tan, gray, green or brown) and/or eschar (tan, brown or black) in the wound bed. (Active)  01/16/23 0022  Location: Heel  Location Orientation: Left  Staging: Unstageable - Full thickness tissue loss in which the base of the injury is covered by slough (yellow, tan, gray, green or brown) and/or eschar (tan, brown or black) in the wound bed.  Wound Description (Comments):   Present on Admission: Yes     Pressure Injury 01/16/23 Sacrum Deep Tissue Pressure Injury - Purple or maroon localized area of discolored intact skin or blood-filled blister due to damage of underlying soft tissue from pressure and/or shear. (Active)  01/16/23 0025  Location: Sacrum  Location Orientation:   Staging: Deep Tissue Pressure Injury - Purple or maroon localized area of discolored intact skin or blood-filled blister due to damage of underlying soft tissue from pressure and/or shear.  Wound Description (Comments):   Present on Admission: Yes     Pressure Injury 05/06/23 Toe (Comment  which one) Anterior;Right Stage 2 -  Partial thickness loss of dermis presenting as a shallow open injury with a red, pink wound bed  without slough. Full thickness wound, NOT a pressure injury (Active)  05/06/23 1500  Location: Toe (Comment  which one)  Location Orientation: Anterior;Right (second toe)  Staging: Stage 2 -  Partial thickness loss of dermis presenting as a shallow open injury with a red, pink wound bed without slough.  Wound Description (Comments): Full thickness wound, NOT a pressure injury  Present on Admission: Yes     Pressure Injury 05/06/23 Toe (Comment  which one) Anterior;Left Unstageable - Full thickness tissue loss in which the base of the injury is covered by slough (yellow, tan, gray, green or  brown) and/or eschar (tan, brown or black) in the wound bed. full t (Active)  05/06/23 1500  Location: Toe (Comment  which one)  Location Orientation: Anterior;Left  Staging: Unstageable - Full thickness tissue loss in which the base of the injury is covered by slough (yellow, tan, gray, green or brown) and/or eschar (tan, brown or black) in the wound bed.  Wound Description (Comments): full thickness wound, NOT a pressure injury  Present on Admission: Yes     Pressure Injury 08/28/23 Buttocks Right;Left Stage 2 -  Partial thickness loss of dermis presenting as a shallow open injury with a red, pink wound bed without slough. R upper buttock 0.5 cm x 0.5 cm x 0.1 cm; L upper buttock 1 cm x 0.5 cm x 0.1 cm (Active)  08/28/23   Location: Buttocks  Location Orientation: Right;Left  Staging: Stage 2 -  Partial thickness loss of dermis presenting as a shallow open injury with a red, pink wound bed without slough.  Wound Description (Comments): R upper buttock 0.5 cm x 0.5 cm x 0.1 cm; L upper buttock 1 cm x 0.5 cm x 0.1 cm  Present on Admission: Yes    Consultants:  GI  Procedures:  None  Antimicrobials:  Anti-infectives (From admission, onward)    Start     Dose/Rate Route Frequency Ordered Stop   08/27/23 2215  ciprofloxacin (CIPRO) tablet 250 mg        250 mg Oral Daily at 10 pm 08/27/23 2212  08/30/23 2159   08/27/23 2145  ciprofloxacin (CIPRO) tablet 250 mg  Status:  Discontinued        250 mg Oral 2 times daily 08/27/23 2144 08/27/23 2211         Subjective: Seen and examined.  She is fully alert and oriented.  She has no complaints but she is annoyed that she had been woken up multiple times overnight and did not have a good sleep.  She feels tired.  Objective: Vitals:   08/28/23 1720 08/28/23 2224 08/29/23 0139 08/29/23 0459  BP: (!) 139/59 (!) 149/56 (!) 157/74 (!) 144/51  Pulse: 70 76 78 70  Resp: 20 (!) 21  19  Temp: 98.4 F (36.9 C) 98.3 F (36.8 C) 98.2 F (36.8 C) 97.6 F (36.4 C)  TempSrc: Oral Oral Oral Oral  SpO2: 96% 94% 96% 93%  Weight:      Height:        Intake/Output Summary (Last 24 hours) at 08/29/2023 0935 Last data filed at 08/29/2023 8119 Gross per 24 hour  Intake 1440.75 ml  Output 150 ml  Net 1290.75 ml   Filed Weights   08/27/23 1751  Weight: 70 kg    Examination:  General exam: Appears calm and comfortable  Respiratory system: Clear to auscultation. Respiratory effort normal. Cardiovascular system: S1 & S2 heard, RRR. No JVD, murmurs, rubs, gallops or clicks. No pedal edema. Gastrointestinal system: Abdomen is nondistended, soft and nontender. No organomegaly or masses felt. Normal bowel sounds heard. Central nervous system: Alert and oriented x 2. No focal neurological deficits. Extremities: Symmetric 5 x 5 power. Skin: No rashes, lesions or ulcers.   Data Reviewed: I have personally reviewed following labs and imaging studies  CBC: Recent Labs  Lab 08/27/23 1806 08/28/23 0642 08/28/23 1843 08/29/23 0818  WBC 7.8 5.9  --  5.4  NEUTROABS 5.5  --   --  3.7  HGB 7.9* 5.8* 8.3* 7.3*  HCT 25.1* 18.5* 26.3* 23.6*  MCV 93.7 92.5  --  93.3  PLT 291 231  --  221   Basic Metabolic Panel: Recent Labs  Lab 08/27/23 1806 08/28/23 0642 08/29/23 0818  NA 132* 128* 128*  K 3.3* 3.9 4.1  CL 94* 94* 95*  CO2 28 26 26    GLUCOSE 109* 90 87  BUN 19 18 14   CREATININE 1.34* 1.30* 1.25*  CALCIUM 9.2 8.5* 8.6*   GFR: Estimated Creatinine Clearance: 26.4 mL/min (A) (by C-G formula based on SCr of 1.25 mg/dL (H)). Liver Function Tests: Recent Labs  Lab 08/27/23 1806 08/28/23 0642  AST 30 24  ALT 16 13  ALKPHOS 135* 100  BILITOT 0.8 1.1  PROT 7.3 5.7*  ALBUMIN 3.0* 2.3*   No results for input(s): "LIPASE", "AMYLASE" in the last 168 hours. No results for input(s): "AMMONIA" in the last 168 hours. Coagulation Profile: Recent Labs  Lab 08/27/23 1806 08/28/23 0642  INR 4.2* 3.5*   Cardiac Enzymes: No results for input(s): "CKTOTAL", "CKMB", "CKMBINDEX", "TROPONINI" in the last 168 hours. BNP (last 3 results) No results for input(s): "PROBNP" in the last 8760 hours. HbA1C: No results for input(s): "HGBA1C" in the last 72 hours. CBG: No results for input(s): "GLUCAP" in the last 168 hours. Lipid Profile: No results for input(s): "CHOL", "HDL", "LDLCALC", "TRIG", "CHOLHDL", "LDLDIRECT" in the last 72 hours. Thyroid Function Tests: Recent Labs    08/28/23 0642  TSH 1.207   Anemia Panel: Recent Labs    08/28/23 0642  VITAMINB12 1,036*  FOLATE 11.5  FERRITIN 45  TIBC 246*  IRON 32  RETICCTPCT 2.8   Sepsis Labs: No results for input(s): "PROCALCITON", "LATICACIDVEN" in the last 168 hours.  No results found for this or any previous visit (from the past 240 hours).   Radiology Studies: US RENAL Result Date: 08/28/2023 CLINICAL DATA:  Acute kidney injury EXAM: RENAL / URINARY TRACT ULTRASOUND COMPLETE COMPARISON:  CT abdomen and pelvis 01/15/2023 FINDINGS: Right Kidney: Renal measurements: 7.8 x 4.2 x 4.5 cm = volume: 78 mL. Echogenicity is increased. No mass or hydronephrosis visualized. Left Kidney: Renal measurements: 8.9 x 4.7 x 3.9 cm = volume: 86 mL. Echogenicity is increased. There is no hydronephrosis. There is a 3.3 x 3.4 x 3.0 cm simple cyst in the inferior pole of the left  kidney. Bladder: Appears normal for degree of bladder distention. Other: None. IMPRESSION: 1. No hydronephrosis. 2. Increased renal echogenicity bilaterally consistent with medical renal disease. 3. Simple cyst in the inferior pole of the left kidney. Electronically Signed   By: Darliss Cheney M.D.   On: 08/28/2023 00:07   DG CHEST PORT 1 VIEW Result Date: 08/27/2023 CLINICAL DATA:  Shortness of breath.  COPD exacerbation. EXAM: PORTABLE CHEST 1 VIEW COMPARISON:  05/05/2023 FINDINGS: Stable cardiomegaly. Aortic atherosclerotic calcification. Hazy bilateral airspace and interstitial opacities greatest in the lower lungs. Small bilateral pleural effusions. Advanced arthritis both shoulders. IMPRESSION: Cardiomegaly with pulmonary edema and small bilateral pleural effusions. Electronically Signed   By: Minerva Fester M.D.   On: 08/27/2023 23:40    Scheduled Meds:  atorvastatin  10 mg Oral Daily   ciprofloxacin  250 mg Oral Q2200   diclofenac Sodium  4 g Topical QID   gabapentin  300 mg Oral BID   Gerhardt's butt cream   Topical BID   levothyroxine  100 mcg Oral QAC breakfast   pantoprazole (PROTONIX) IV  40 mg Intravenous Q12H   sodium chloride flush  3 mL Intravenous Q12H   spironolactone  12.5 mg Oral Daily  Continuous Infusions:   LOS: 2 days   Hughie Closs, MD [facility]  08/29/2023, 9:35 AM   *Please note that this is a verbal dictation therefore any spelling or grammatical errors are due to the "Dragon [employer] One" system interpretation.  Please page via Amion and do not message via secure chat for urgent patient care matters. Secure chat can be used for non urgent patient care matters.  How to contact the [facility] Attending or Consulting provider 7A - 7P or covering provider during after hours 7P -7A, for this patient?  Check the care team in [facility] and look for a) attending/consulting [facility] provider listed and b) the [facility] team listed. Page or secure chat 7A-7P. Log into  [email or web address] and use [facility] universal password to access. If you do not have the password, please contact the hospital operator. Locate the [facility] provider you are looking for under [facility] and page to a number that you can be directly reached. If you still have difficulty reaching the provider, please page the [facility] (Director on Call) for the Hospitalists listed on amion for assistance.

## 2023-08-29 NOTE — Evaluation (Addendum)
Physical Therapy Evaluation Patient Details Name: [name] MRN: [id number] DOB: [birth year] Today's Date: 08/29/2023  History of Present Illness  89 y.o. female admitted 08/27/23 for Acute blood loss anemia secondary to upper GI bleed secondary to Coumadin use/hypercoagulable state/supratherapeutic INR. Past medical history significant of chronic use of 2 L/min of supplementary oxygen, CHF, dysphagia, hiatal hernia, arrhythmia, afib.  Patient has also had recurrent pulmonary embolism.  Clinical Impression  Pt admitted with above diagnosis.  Pt currently with functional limitations due to the deficits listed below (see PT Problem List). Pt will benefit from acute skilled PT to increase their independence and safety with mobility to allow discharge.  Pt assisted with ambulating short distance.  Pt requiring 2L O2 [place] at baseline and SPO2 dropped to 83% with ambulation.  SpO2 improved to 90% on 2L with sitting in recliner.  No family present and pt questionable historian however likely close to her baseline.         If plan is discharge home, recommend the following: A little help with walking and/or transfers;A little help with bathing/dressing/bathroom;Help with stairs or ramp for entrance;Assistance with cooking/housework   Can travel by private vehicle        Equipment Recommendations None recommended by PT  Recommendations for Other Services       Functional Status Assessment Patient has had a recent decline in their functional status and demonstrates the ability to make significant improvements in function in a reasonable and predictable amount of time.     Precautions / Restrictions Precautions Precautions: Fall Precaution Comments: 2L O2 baseline Restrictions Weight Bearing Restrictions Per Provider Order: No      Mobility  Bed Mobility               General bed mobility comments: pt in recliner on arrival    Transfers Overall transfer level: Needs  assistance Equipment used: Rolling walker (2 wheels) Transfers: Sit to/from Stand Sit to Stand: Min assist           General transfer comment: light assist to rise, pt reliant on UE self assist    Ambulation/Gait Ambulation/Gait assistance: Contact guard assist Gait Distance (Feet): 16 Feet Assistive device: Rolling walker (2 wheels) Gait Pattern/deviations: Step-through pattern, Decreased stride length, Trunk flexed       General Gait Details: pt reports she keeps one forearm on RW at baseline, distance limited at baseline as well, SpO2 83% on her baseline 2L O2 [place] with ambulation but returned to 90% upon sitting in recliner  Stairs            Wheelchair Mobility     Tilt Bed    Modified Rankin (Stroke Patients Only)       Balance Overall balance assessment: Mild deficits observed, not formally tested                                           Pertinent Vitals/Pain Pain Assessment Pain Assessment: No/denies pain    Home Living Family/patient expects to be discharged to:: Private residence Living Arrangements: Children Available Help at Discharge: Family;Available 24 hours/day Type of Home: House Home Access: Ramped entrance       Home Layout: One level Home Equipment: Rollator (4 wheels);BSC/3in1 Additional Comments: living at daughter's home as above    Prior Function Prior Level of Function : Needs assist  Mobility Comments: uses 4WW, only household distances ADLs Comments: daughter or granddaughter assists with ADLs as needed     Extremity/Trunk Assessment        Lower Extremity Assessment Lower Extremity Assessment: Generalized weakness    Cervical / Trunk Assessment Cervical / Trunk Assessment: Kyphotic  Communication   Communication Communication: Difficulty following commands/understanding  Cognition Arousal: Alert Behavior During Therapy: Anxious Overall Cognitive Status: No family/caregiver  present to determine baseline cognitive functioning                                 General Comments: patient continuing to ask the same questions        General Comments      Exercises     Assessment/Plan    PT Assessment Patient needs continued PT services  PT Problem List Decreased strength;Decreased activity tolerance;Decreased balance;Decreased mobility;Decreased knowledge of use of DME       PT Treatment Interventions DME instruction;Gait training;Functional mobility training;Therapeutic activities;Therapeutic exercise;Patient/family education;Balance training    PT Goals (Current goals can be found in the Care Plan section)  Acute Rehab PT Goals PT Goal Formulation: With patient Time For Goal Achievement: 09/12/23 Potential to Achieve Goals: Fair    Frequency Min 1X/week     Co-evaluation               AM-PAC PT "6 Clicks" Mobility  Outcome Measure Help needed turning from your back to your side while in a flat bed without using bedrails?: A Little Help needed moving from lying on your back to sitting on the side of a flat bed without using bedrails?: A Little Help needed moving to and from a bed to a chair (including a wheelchair)?: A Little Help needed standing up from a chair using your arms (e.g., wheelchair or bedside chair)?: A Lot Help needed to walk in hospital room?: A Lot Help needed climbing 3-5 steps with a railing? : A Lot 6 Click Score: 15    End of Session Equipment Utilized During Treatment: Gait belt;Oxygen Activity Tolerance: Patient tolerated treatment well Patient left: in chair;with call bell/phone within reach;with chair alarm set   PT Visit Diagnosis: Difficulty in walking, not elsewhere classified (R26.2)    Time: 4259-5638 PT Time Calculation (min) (ACUTE ONLY): 24 min   Charges:   PT Evaluation $PT Eval Low Complexity: 1 Low PT Treatments $Gait Training: 8-22 mins PT General Charges $$ ACUTE PT VISIT: 1  Visit        Thomasene Mohair PT, DPT Physical Therapist Acute Rehabilitation Services Office: [phone]   Janan Halter [place] 08/29/2023, 12:17 PM

## 2023-08-29 NOTE — Progress Notes (Signed)
Mobility Specialist - Progress Note   08/29/23 0958  Mobility  Activity Transferred to/from [facility]  Level of Assistance +2 (takes two people)  Assistive Device [facility]  Range of Motion/Exercises Active Assistive  Activity Response Tolerated fair  Mobility visit 1 Mobility  Mobility Specialist Start Time (ACUTE ONLY) 0930  Mobility Specialist Stop Time (ACUTE ONLY) 0958  Mobility Specialist Time Calculation (min) (ACUTE ONLY) 28 min   Assisted RN with pericare of pt and transfer to [facility]. Pt required verbal cues throughout session and mod-A. Pt after BSC transferred to recliner chair. Was left with all needs met. RN in room.  Billey Chang Mobility Specialist

## 2023-08-29 NOTE — Evaluation (Signed)
Occupational Therapy Evaluation Patient Details Name: [name] MRN: [id number] DOB: [birth year] Today's Date: 08/29/2023   History of Present Illness Patient 89 year old female admitted 08/27/23 for Acute blood loss anemia secondary to upper GI bleed secondary to Coumadin use/hypercoagulable state/supratherapeutic INR. Past medical history significant of chronic use of 2 L/min of supplementary oxygen, CHF, dysphagia, hiatal hernia, arrhythmia, afib.  Patient has also had recurrent pulmonary embolism.   Clinical Impression   Patient is a 89 year old female who was admitted for above. Patient was living at home with daughter support. Patient was confused during session with patient repeating asking  to see MD, why she is here, and for different food for breakfast. Patient was noted to have decreased functional activity tolerance, decreased endurance, decreased standing balance, decreased safety awareness, and decreased knowledge of AD/AE impacting participation in ADLs. Plan for patient to transition home with daughter support.Patient would continue to benefit from skilled OT services at this time while admitted to address noted deficits in order to improve overall safety and independence in ADLs.      If plan is discharge home, recommend the following: A little help with walking and/or transfers;Assistance with cooking/housework;Direct supervision/assist for medications management;Assist for transportation;Help with stairs or ramp for entrance;Direct supervision/assist for financial management;A lot of help with bathing/dressing/bathroom    Functional Status Assessment  Patient has had a recent decline in their functional status and demonstrates the ability to make significant improvements in function in a reasonable and predictable amount of time.  Equipment Recommendations  None recommended by OT       Precautions / Restrictions Precautions Precautions: Fall Precaution Comments: 2L O2  baseline Restrictions Weight Bearing Restrictions Per Provider Order: No      Mobility Bed Mobility               General bed mobility comments: pt in recliner on arrival and returned to the same          Balance Overall balance assessment: Mild deficits observed, not formally tested           ADL either performed or assessed with clinical judgement   ADL Overall ADL's : Needs assistance/impaired Eating/Feeding: Set up;Sitting Eating/Feeding Details (indicate cue type and reason): noted to have cough after intake with all sips. nurse made aware. Grooming: Brushing hair;Sitting;Minimal assistance Grooming Details (indicate cue type and reason): repoting "that comb is too small for my hair" Upper Body Bathing: Sitting;Minimal assistance   Lower Body Bathing: Sitting/lateral leans;Maximal assistance   Upper Body Dressing : Minimal assistance;Sitting   Lower Body Dressing: Sitting/lateral leans;Maximal assistance   Toilet Transfer: Minimal assistance Toilet Transfer Details (indicate cue type and reason): sit to stand in front of recliner in room with increased time. able to take one hand off walker and touch posteriorly and head. declined to transfer at this time. Toileting- Architect and Hygiene: Sit to/from stand;Moderate assistance               Vision Baseline Vision/History: 1 Wears glasses              Pertinent Vitals/Pain Pain Assessment Pain Assessment: No/denies pain     Extremity/Trunk Assessment Upper Extremity Assessment Upper Extremity Assessment: Overall WFL for tasks assessed (reporting pain in IV in L anterior elbow)   Lower Extremity Assessment Lower Extremity Assessment: Generalized weakness   Cervical / Trunk Assessment Cervical / Trunk Assessment: Kyphotic   Communication Communication Communication: Difficulty following commands/understanding   Cognition Arousal: Alert  Behavior During Therapy: Anxious Overall  Cognitive Status: No family/caregiver present to determine baseline cognitive functioning           General Comments: patient continuing to ask the same questions                Home Living Family/patient expects to be discharged to:: Private residence Living Arrangements: Children Available Help at Discharge: Family;Available 24 hours/day Type of Home: House Home Access: Ramped entrance     Home Layout: One level     Bathroom Shower/Tub: Sponge bathes at baseline   Bathroom Toilet: Standard     Home Equipment: Rollator (4 wheels);BSC/3in1   Additional Comments: living at daughter's home as above      Prior Functioning/Environment Prior Level of Function : Needs assist             Mobility Comments: uses 4WW, only household distances ADLs Comments: daughter or granddaughter assists with ADLs as needed        OT Problem List: Decreased activity tolerance;Impaired balance (sitting and/or standing);Decreased coordination;Decreased safety awareness;Decreased knowledge of use of DME or AE;Decreased knowledge of precautions      OT Treatment/Interventions: Therapeutic exercise;Self-care/ADL training;DME and/or AE instruction;Therapeutic activities;Patient/family education;Balance training    OT Goals(Current goals can be found in the care plan section) Acute Rehab OT Goals Patient Stated Goal: to speak with MD OT Goal Formulation: Patient unable to participate in goal setting Time For Goal Achievement: 09/12/23 Potential to Achieve Goals: Fair  OT Frequency: Min 1X/week       AM-PAC OT "6 Clicks" Daily Activity     Outcome Measure Help from another person eating meals?: A Little Help from another person taking care of personal grooming?: A Little Help from another person toileting, which includes using toliet, bedpan, or urinal?: A Lot Help from another person bathing (including washing, rinsing, drying)?: A Lot Help from another person to put on and  taking off regular upper body clothing?: A Little Help from another person to put on and taking off regular lower body clothing?: A Lot 6 Click Score: 15   End of Session Equipment Utilized During Treatment: Rolling walker (2 wheels) Nurse Communication: Mobility status;Other (comment) (concerns over cough after swallow.)  Activity Tolerance: Patient limited by fatigue Patient left: in chair;with call bell/phone within reach;with chair alarm set  OT Visit Diagnosis: Unsteadiness on feet (R26.81);Muscle weakness (generalized) (M62.81)                Time: 6962-9528 OT Time Calculation (min): 19 min Charges:  OT General Charges $OT Visit: 1 Visit OT Evaluation $OT Eval Low Complexity: 1 Low  Briceson Broadwater OTR/L, MS Acute Rehabilitation Department Office# [phone]   Selinda Flavin 08/29/2023, 12:51 PM

## 2023-08-30 DIAGNOSIS — D649 Anemia, unspecified: Secondary | ICD-10-CM | POA: Diagnosis not present

## 2023-08-30 LAB — TYPE AND SCREEN
ABO/RH(D): A POS
Antibody Screen: NEGATIVE
Unit division: 0

## 2023-08-30 LAB — BASIC METABOLIC PANEL
Anion gap: 9 (ref 5–15)
BUN: 12 mg/dL (ref 8–23)
CO2: 22 mmol/L (ref 22–32)
Calcium: 9.1 mg/dL (ref 8.9–10.3)
Chloride: 98 mmol/L (ref 98–111)
Creatinine, Ser: 1.1 mg/dL — ABNORMAL HIGH (ref 0.44–1.00)
GFR, Estimated: 47 mL/min — ABNORMAL LOW (ref >60)
Glucose, Bld: 79 mg/dL (ref 70–99)
Potassium: 4.5 mmol/L (ref 3.5–5.1)
Sodium: 129 mmol/L — ABNORMAL LOW (ref 135–145)

## 2023-08-30 LAB — CBC WITH DIFFERENTIAL/PLATELET
Abs Immature Granulocytes: 0.02 10*3/uL (ref 0.00–0.07)
Basophils Absolute: 0 10*3/uL (ref 0.0–0.1)
Basophils Relative: 1 %
Eosinophils Absolute: 0.3 10*3/uL (ref 0.0–0.5)
Eosinophils Relative: 6 %
HCT: 22.7 % — ABNORMAL LOW (ref 36.0–46.0)
Hemoglobin: 7.3 g/dL — ABNORMAL LOW (ref 12.0–15.0)
Immature Granulocytes: 0 %
Lymphocytes Relative: 15 %
Lymphs Abs: 0.7 10*3/uL (ref 0.7–4.0)
MCH: 29.8 pg (ref 26.0–34.0)
MCHC: 32.2 g/dL (ref 30.0–36.0)
MCV: 92.7 fL (ref 80.0–100.0)
Monocytes Absolute: 0.5 10*3/uL (ref 0.1–1.0)
Monocytes Relative: 11 %
Neutro Abs: 3.2 10*3/uL (ref 1.7–7.7)
Neutrophils Relative %: 67 %
Platelets: 222 10*3/uL (ref 150–400)
RBC: 2.45 MIL/uL — ABNORMAL LOW (ref 3.87–5.11)
RDW: 15.1 % (ref 11.5–15.5)
WBC: 4.8 10*3/uL (ref 4.0–10.5)
nRBC: 0 % (ref 0.0–0.2)

## 2023-08-30 LAB — BPAM RBC
Blood Product Expiration Date: 202501162359
ISSUE DATE / TIME: 202412201231
Unit Type and Rh: 6200

## 2023-08-30 NOTE — Progress Notes (Signed)
Physical Therapy Treatment Patient Details Name: [name] MRN: [id number] DOB: Dec 08, 1927 Today's Date: 08/30/2023   History of Present Illness Patient 89 year old female admitted 08/27/23 for Acute blood loss anemia secondary to upper GI bleed secondary to Coumadin use/hypercoagulable state/supratherapeutic INR. Past medical history significant of chronic use of 2 L/min of supplementary oxygen, CHF, dysphagia, hiatal hernia, arrhythmia, afib.  Patient has also had recurrent pulmonary embolism.    PT Comments  Pt in recliner and requesting assist back to bed.  Pt reported fatigue and declined ambulation.  Pt safely assist back to bed and repositioned.     If plan is discharge home, recommend the following: A little help with walking and/or transfers;A little help with bathing/dressing/bathroom;Help with stairs or ramp for entrance;Assistance with cooking/housework   Can travel by private vehicle        Equipment Recommendations  None recommended by PT    Recommendations for Other Services       Precautions / Restrictions Precautions Precautions: Fall Precaution Comments: 2L O2 baseline     Mobility  Bed Mobility Overal bed mobility: Needs Assistance Bed Mobility: Sit to Supine       Sit to supine: Mod assist   General bed mobility comments: assist for LEs into bed, repositioned to comfort in supine and provided pillow under one hip (for pressure relief and due to pt's reports of buttocks pain from sitting in recliner)    Transfers Overall transfer level: Needs assistance Equipment used: Rolling walker (2 wheels) Transfers: Sit to/from Stand, Bed to chair/wheelchair/BSC Sit to Stand: Min assist   Step pivot transfers: Min assist       General transfer comment: light assist to rise, pt reliant on UE self assist, assist for controlling descent to bed; remained on O2 [place]    Ambulation/Gait                   Stairs             Wheelchair  Mobility     Tilt Bed    Modified Rankin (Stroke Patients Only)       Balance Overall balance assessment: Mild deficits observed, not formally tested                                          Cognition Arousal: Alert Behavior During Therapy: WFL for tasks assessed/performed Overall Cognitive Status: No family/caregiver present to determine baseline cognitive functioning                                 General Comments: pt tends to repeat questions mostly asking why she is here        Exercises      General Comments        Pertinent Vitals/Pain Pain Assessment Pain Assessment: Faces Faces Pain Scale: Hurts little more Pain Location: buttocks (from sitting in recliner per pt) Pain Descriptors / Indicators: Grimacing, Sore Pain Intervention(s): Repositioned, Monitored during session    Home Living                          Prior Function            PT Goals (current goals can now be found in the care plan section) Progress towards PT goals: Progressing  toward goals    Frequency    Min 1X/week      PT Plan      Co-evaluation              AM-PAC PT "6 Clicks" Mobility   Outcome Measure  Help needed turning from your back to your side while in a flat bed without using bedrails?: A Little Help needed moving from lying on your back to sitting on the side of a flat bed without using bedrails?: A Little Help needed moving to and from a bed to a chair (including a wheelchair)?: A Little Help needed standing up from a chair using your arms (e.g., wheelchair or bedside chair)?: A Little Help needed to walk in hospital room?: A Lot Help needed climbing 3-5 steps with a railing? : A Lot 6 Click Score: 16    End of Session Equipment Utilized During Treatment: Gait belt;Oxygen Activity Tolerance: Patient tolerated treatment well Patient left: in bed;with call bell/phone within reach;with bed alarm set Nurse  Communication: Mobility status PT Visit Diagnosis: Difficulty in walking, not elsewhere classified (R26.2)     Time: 1610-9604 PT Time Calculation (min) (ACUTE ONLY): 8 min  Charges:    $Therapeutic Activity: 8-22 mins PT General Charges $$ ACUTE PT VISIT: 1 Visit                     Thomasene Mohair PT, DPT Physical Therapist Acute Rehabilitation Services Office: [phone]    Janan Halter [place] 08/30/2023, 3:12 PM

## 2023-08-30 NOTE — Progress Notes (Addendum)
PROGRESS NOTE    [name]  KZS:[id number] DOB: August 19, 1928 DOA: 08/27/2023 PCP: Camie Patience, FNP   Brief Narrative:  HPI: [name] is a 89 y.o. female with medical history significant of chronic use of 2 L/min of supplementary oxygen.  Patient has also had recurrent pulmonary embolism.  The last episode was more than 10 years ago.  Patient is chronically anticoagulated with warfarin.   Patient is alert and awake and partakes in the history however principally the history is provided by the daughter at the bedside.  Patient tends to have falls at home has had about 6 falls this year that the daughter can count.  However there are instances when the patient is by herself at home.  There is concern that the patient try and tends to minimize her symptoms.  Patient has had various bruising develop on her lower extremity that have been attributed to her falls.  Patient also had self-limiting episode of left nare bleed a few days ago.  Patient denies any vomiting denies any diarrhea.  No abdominal pain.  Patient was diagnosed with a urinary tract infection about 3 to 4 days ago.  Started treatment with doxycycline subsequently changed to ciprofloxacin.  Patient currently reports no dysuria no fevers no rigors.  Patient also was noted to have worse hyponatremia than her baseline and given a 5-day course of sodium chloride tablets.   Patient had workup done at primary care office yesterday with finding of hemoglobin of 7.1.  Patient referred to the ER for workup today.  No history of chest pain, new shortness of breath presyncope.  Patient does have chronic shortness of breath.  Assessment & Plan:   Principal Problem:   Anemia Active Problems:   Hyponatremia   UTI (urinary tract infection)   Chronic obstructive lung disease (HCC)   AKI on CKD-3B   Pressure injury of back, stage 2 (HCC)  Acute blood loss anemia secondary to upper GI bleed secondary to Coumadin use/hypercoagulable  state/supratherapeutic INR: This is rather incidentally found.  Patient has had several sites of blood loss including bruising in legs due to her fall, recent nare bleed.  No history of melena or hematochezia but FOBT positive indicating possible contribution from upper GI bleed secondary to anticoagulation/Coumadin use.  INR was supratherapeutic at 4.2 upon arrival, she received vitamin K and INR down to 3.5 today.  However hemoglobin dropped to under 6 from 7.9 at presentation, 1 unit of PRBC transfusion is ordered.  Admitting hospitalist discussed with the patient and daughter and they would not want a GI referral for potential colonoscopy or endoscopy for this.  I personally spoke to the daughter.  She said that she never said that she would not want GI on board or EGD for the patient, she was opposed to having colonoscopy.  She was okay with GI seeing the patient, Dr. Ewing Schlein saw patient, did not recommend any intervention.  Recommended holding anticoagulation which we are already doing.  Patient's hemoglobin improved to 8.3 after transfusion but dropped to 7.3 again today.  I had a lengthy discussion with the daughter over the phone and per her, she did not receive any phone call from Dr. Ewing Schlein.  I informed her about his recommendations to discontinue Coumadin and likely continuing aspirin.  I have found out from her that patient's last PE was 20 years ago.  We discussed in length that now that she is 89 year old, has been falling recurrently and is likely having GI  bleed, the risks of continuing anticoagulation with Coumadin in her situation is far more than the benefits and she agreed with stopping Coumadin and resuming aspirin when safe.  We will however keep her in the hospital overnight and repeat CBC in the morning due to dropping hemoglobin.  Continue Protonix.  Will advance her to soft diet.  History of recurrent PEs: Coumadin on hold for active GI bleed.   UTI (urinary tract infection) This was  diagnosed prior to admison.  She was initially treated with doxycycline and then ciprofloxacin.  UA upon arrival does not indicate infection however ciprofloxacin was continued and she completed the course.   Chronic hyponatremia: Upon chart review, it appears that she has chronic hyponatremia ranging anywhere from 120-132.  Per report patient had worse hyponatremia a few days ago and was treated with oral sodium chloride tablets.  Sodium hovering around 128-132.    Pressure injury of back, stage 2 (HCC) Patient was noted to have pressure injury stage II of the coccygeal area.  Further daughter shows pressure injury photo of left heel that is currently dressed.  We will request wound care evaluation.  Turn and position patient.   AKI on CKD-3B rule out.  CKD stage IIIa-3B Chart review indicates that patient's baseline hemoglobin ranges from 1-1.1 hovering between CKD stage IIIa and IIIb, she presented with 1.34, does not meet criteria for AKI.  She is almost at her baseline.  Avoid nephrotoxic agents.  Clinically she appears slightly dehydrated.  I will start her on normal saline gently at 75 cc/h for 20 hours and repeat labs in the morning.  Renal ultrasound shows medical renal disease.  No obstruction no hydronephrosis.  Chronic obstructive lung disease (HCC) [place] 2lpm oxygen.  Although chest x-ray shows pulmonary edema but clinically she does not appear to be fluid overloaded and lungs are clear to auscultation.  She is currently on 2 L of oxygen.  Recurrent falls: Seen by PT OT, home health PT is recommended.  DVT prophylaxis: SCDs Start: 08/27/23 2029   Code Status: Limited: Do not attempt resuscitation (DNR) -DNR-LIMITED -Do Not Intubate/DNI   Family Communication:  None present at bedside.  Lengthy discussion with the daughter as mentioned above. Status is: Inpatient Remains inpatient appropriate because: Anemia, drop in blood,    Estimated body mass index is 27.34 kg/m as  calculated from the following:   Height as of this encounter: 5\' 3"  (1.6 m).   Weight as of this encounter: 70 kg.  Pressure Injury 01/16/23 Heel Left Unstageable - Full thickness tissue loss in which the base of the injury is covered by slough (yellow, tan, gray, green or brown) and/or eschar (tan, brown or black) in the wound bed. (Active)  01/16/23 0022  Location: Heel  Location Orientation: Left  Staging: Unstageable - Full thickness tissue loss in which the base of the injury is covered by slough (yellow, tan, gray, green or brown) and/or eschar (tan, brown or black) in the wound bed.  Wound Description (Comments):   Present on Admission: Yes     Pressure Injury 01/16/23 Sacrum Deep Tissue Pressure Injury - Purple or maroon localized area of discolored intact skin or blood-filled blister due to damage of underlying soft tissue from pressure and/or shear. (Active)  01/16/23 0025  Location: Sacrum  Location Orientation:   Staging: Deep Tissue Pressure Injury - Purple or maroon localized area of discolored intact skin or blood-filled blister due to damage of underlying soft tissue from pressure and/or shear.  Wound Description (Comments):   Present on Admission: Yes     Pressure Injury 05/06/23 Toe (Comment  which one) Anterior;Right Stage 2 -  Partial thickness loss of dermis presenting as a shallow open injury with a red, pink wound bed without slough. Full thickness wound, NOT a pressure injury (Active)  05/06/23 1500  Location: Toe (Comment  which one)  Location Orientation: Anterior;Right (second toe)  Staging: Stage 2 -  Partial thickness loss of dermis presenting as a shallow open injury with a red, pink wound bed without slough.  Wound Description (Comments): Full thickness wound, NOT a pressure injury  Present on Admission: Yes     Pressure Injury 05/06/23 Toe (Comment  which one) Anterior;Left Unstageable - Full thickness tissue loss in which the base of the injury is covered  by slough (yellow, tan, gray, green or brown) and/or eschar (tan, brown or black) in the wound bed. full t (Active)  05/06/23 1500  Location: Toe (Comment  which one)  Location Orientation: Anterior;Left  Staging: Unstageable - Full thickness tissue loss in which the base of the injury is covered by slough (yellow, tan, gray, green or brown) and/or eschar (tan, brown or black) in the wound bed.  Wound Description (Comments): full thickness wound, NOT a pressure injury  Present on Admission: Yes  Dressing Type Gauze (Comment) 08/30/23 0809     Pressure Injury 08/28/23 Buttocks Right;Left Stage 2 -  Partial thickness loss of dermis presenting as a shallow open injury with a red, pink wound bed without slough. R upper buttock 0.5 cm x 0.5 cm x 0.1 cm; L upper buttock 1 cm x 0.5 cm x 0.1 cm (Active)  08/28/23   Location: Buttocks  Location Orientation: Right;Left  Staging: Stage 2 -  Partial thickness loss of dermis presenting as a shallow open injury with a red, pink wound bed without slough.  Wound Description (Comments): R upper buttock 0.5 cm x 0.5 cm x 0.1 cm; L upper buttock 1 cm x 0.5 cm x 0.1 cm  Present on Admission: Yes  Dressing Type Foam - Lift dressing to assess site every shift 08/30/23 0809   Nutritional Assessment: Body mass index is 27.34 kg/m.[name] Kitchen Seen by dietician.  I agree with the assessment and plan as outlined below: Nutrition Status:        . Skin Assessment: I have examined the patient's skin and I agree with the wound assessment as performed by the wound care RN as outlined below: Pressure Injury 01/16/23 Heel Left Unstageable - Full thickness tissue loss in which the base of the injury is covered by slough (yellow, tan, gray, green or brown) and/or eschar (tan, brown or black) in the wound bed. (Active)  01/16/23 0022  Location: Heel  Location Orientation: Left  Staging: Unstageable - Full thickness tissue loss in which the base of the injury is covered by slough  (yellow, tan, gray, green or brown) and/or eschar (tan, brown or black) in the wound bed.  Wound Description (Comments):   Present on Admission: Yes     Pressure Injury 01/16/23 Sacrum Deep Tissue Pressure Injury - Purple or maroon localized area of discolored intact skin or blood-filled blister due to damage of underlying soft tissue from pressure and/or shear. (Active)  01/16/23 0025  Location: Sacrum  Location Orientation:   Staging: Deep Tissue Pressure Injury - Purple or maroon localized area of discolored intact skin or blood-filled blister due to damage of underlying soft tissue from pressure and/or shear.  Wound  Description (Comments):   Present on Admission: Yes     Pressure Injury 05/06/23 Toe (Comment  which one) Anterior;Right Stage 2 -  Partial thickness loss of dermis presenting as a shallow open injury with a red, pink wound bed without slough. Full thickness wound, NOT a pressure injury (Active)  05/06/23 1500  Location: Toe (Comment  which one)  Location Orientation: Anterior;Right (second toe)  Staging: Stage 2 -  Partial thickness loss of dermis presenting as a shallow open injury with a red, pink wound bed without slough.  Wound Description (Comments): Full thickness wound, NOT a pressure injury  Present on Admission: Yes     Pressure Injury 05/06/23 Toe (Comment  which one) Anterior;Left Unstageable - Full thickness tissue loss in which the base of the injury is covered by slough (yellow, tan, gray, green or brown) and/or eschar (tan, brown or black) in the wound bed. full t (Active)  05/06/23 1500  Location: Toe (Comment  which one)  Location Orientation: Anterior;Left  Staging: Unstageable - Full thickness tissue loss in which the base of the injury is covered by slough (yellow, tan, gray, green or brown) and/or eschar (tan, brown or black) in the wound bed.  Wound Description (Comments): full thickness wound, NOT a pressure injury  Present on Admission: Yes   Dressing Type Gauze (Comment) 08/30/23 0809     Pressure Injury 08/28/23 Buttocks Right;Left Stage 2 -  Partial thickness loss of dermis presenting as a shallow open injury with a red, pink wound bed without slough. R upper buttock 0.5 cm x 0.5 cm x 0.1 cm; L upper buttock 1 cm x 0.5 cm x 0.1 cm (Active)  08/28/23   Location: Buttocks  Location Orientation: Right;Left  Staging: Stage 2 -  Partial thickness loss of dermis presenting as a shallow open injury with a red, pink wound bed without slough.  Wound Description (Comments): R upper buttock 0.5 cm x 0.5 cm x 0.1 cm; L upper buttock 1 cm x 0.5 cm x 0.1 cm  Present on Admission: Yes  Dressing Type Foam - Lift dressing to assess site every shift 08/30/23 0809    Consultants:  GI  Procedures:  None  Antimicrobials:  Anti-infectives (From admission, onward)    Start     Dose/Rate Route Frequency Ordered Stop   08/27/23 2215  ciprofloxacin (CIPRO) tablet 250 mg        250 mg Oral Daily at 10 pm 08/27/23 2212 08/29/23 2126   08/27/23 2145  ciprofloxacin (CIPRO) tablet 250 mg  Status:  Discontinued        250 mg Oral 2 times daily 08/27/23 2144 08/27/23 2211         Subjective: Seen and examined, nurse at the bedside, patient sitting in the recliner.  Appears to be fully alert and oriented.  No complaints.  She is eager to go home.  Objective: Vitals:   08/29/23 0459 08/29/23 1334 08/29/23 2012 08/30/23 0456  BP: (!) 144/51 133/75 (!) 154/72 (!) 133/51  Pulse: 70 82 90 71  Resp: 19  18 18   Temp: 97.6 F (36.4 C) 97.8 F (36.6 C) 98.2 F (36.8 C) 97.8 F (36.6 C)  TempSrc: Oral Oral Oral Oral  SpO2: 93% 95% 91% 96%  Weight:      Height:        Intake/Output Summary (Last 24 hours) at 08/30/2023 1116 Last data filed at 08/30/2023 0901 Gross per 24 hour  Intake 1080 ml  Output 1200 ml  Net -120 ml   Filed Weights   08/27/23 1751  Weight: 70 kg    Examination:  General exam: Appears calm and comfortable   Respiratory system: Clear to auscultation. Respiratory effort normal. Cardiovascular system: S1 & S2 heard, RRR. No JVD, murmurs, rubs, gallops or clicks. No pedal edema. Gastrointestinal system: Abdomen is nondistended, soft and nontender. No organomegaly or masses felt. Normal bowel sounds heard. Central nervous system: Alert and oriented. No focal neurological deficits. Extremities: Symmetric 5 x 5 power. Skin: No rashes, lesions or ulcers.   Data Reviewed: I have personally reviewed following labs and imaging studies  CBC: Recent Labs  Lab 08/27/23 1806 08/28/23 0642 08/28/23 1843 08/29/23 0818 08/29/23 1703 08/30/23 0547  WBC 7.8 5.9  --  5.4 6.5 4.8  NEUTROABS 5.5  --   --  3.7 4.8 3.2  HGB 7.9* 5.8* 8.3* 7.3* 8.2* 7.3*  HCT 25.1* 18.5* 26.3* 23.6* 25.7* 22.7*  MCV 93.7 92.5  --  93.3 92.1 92.7  PLT 291 231  --  221 244 222   Basic Metabolic Panel: Recent Labs  Lab 08/27/23 1806 08/28/23 0642 08/29/23 0818 08/30/23 0547  NA 132* 128* 128* 129*  K 3.3* 3.9 4.1 4.5  CL 94* 94* 95* 98  CO2 28 26 26 22   GLUCOSE 109* 90 87 79  BUN 19 18 14 12   CREATININE 1.34* 1.30* 1.25* 1.10*  CALCIUM 9.2 8.5* 8.6* 9.1   GFR: Estimated Creatinine Clearance: 30 mL/min (A) (by C-G formula based on SCr of 1.1 mg/dL (H)). Liver Function Tests: Recent Labs  Lab 08/27/23 1806 08/28/23 0642  AST 30 24  ALT 16 13  ALKPHOS 135* 100  BILITOT 0.8 1.1  PROT 7.3 5.7*  ALBUMIN 3.0* 2.3*   No results for input(s): "LIPASE", "AMYLASE" in the last 168 hours. No results for input(s): "AMMONIA" in the last 168 hours. Coagulation Profile: Recent Labs  Lab 08/27/23 1806 08/28/23 0642  INR 4.2* 3.5*   Cardiac Enzymes: No results for input(s): "CKTOTAL", "CKMB", "CKMBINDEX", "TROPONINI" in the last 168 hours. BNP (last 3 results) No results for input(s): "PROBNP" in the last 8760 hours. HbA1C: No results for input(s): "HGBA1C" in the last 72 hours. CBG: No results for input(s):  "GLUCAP" in the last 168 hours. Lipid Profile: No results for input(s): "CHOL", "HDL", "LDLCALC", "TRIG", "CHOLHDL", "LDLDIRECT" in the last 72 hours. Thyroid Function Tests: Recent Labs    08/28/23 0642  TSH 1.207   Anemia Panel: Recent Labs    08/28/23 0642  VITAMINB12 1,036*  FOLATE 11.5  FERRITIN 45  TIBC 246*  IRON 32  RETICCTPCT 2.8   Sepsis Labs: No results for input(s): "PROCALCITON", "LATICACIDVEN" in the last 168 hours.  No results found for this or any previous visit (from the past 240 hours).   Radiology Studies: No results found.   Scheduled Meds:  atorvastatin  10 mg Oral Daily   diclofenac Sodium  4 g Topical QID   gabapentin  300 mg Oral BID   Gerhardt's butt cream   Topical BID   levothyroxine  100 mcg Oral QAC breakfast   pantoprazole (PROTONIX) IV  40 mg Intravenous Q12H   sodium chloride flush  3 mL Intravenous Q12H   spironolactone  12.5 mg Oral Daily   Continuous Infusions:   LOS: 3 days   Hughie Closs, MD [facility]  08/30/2023, 11:16 AM   *Please note that this is a verbal dictation therefore any spelling or grammatical errors are due to  the "[employer] One" system interpretation.  Please page via Amion and do not message via secure chat for urgent patient care matters. Secure chat can be used for non urgent patient care matters.  How to contact the [facility] Attending or Consulting provider 7A - 7P or covering provider during after hours 7P -7A, for this patient?  Check the care team in [facility] and look for a) attending/consulting [facility] provider listed and b) the [facility] team listed. Page or secure chat 7A-7P. Log into [email or web address] and use [place]'s universal password to access. If you do not have the password, please contact the hospital operator. Locate the [facility] provider you are looking for under [facility] and page to a number that you can be directly reached. If you still have difficulty reaching the provider, please page  the [facility] (Director on Call) for the Hospitalists listed on amion for assistance.

## 2023-08-30 NOTE — Plan of Care (Signed)
  Problem: Coping: Goal: Level of anxiety will decrease Outcome: Progressing   Problem: Pain Management: Goal: General experience of comfort will improve Outcome: Progressing   Problem: Safety: Goal: Ability to remain free from injury will improve Outcome: Progressing   Problem: Skin Integrity: Goal: Risk for impaired skin integrity will decrease Outcome: Progressing

## 2023-08-31 ENCOUNTER — Telehealth (HOSPITAL_BASED_OUTPATIENT_CLINIC_OR_DEPARTMENT_OTHER): Payer: Self-pay | Admitting: Cardiology

## 2023-08-31 DIAGNOSIS — D649 Anemia, unspecified: Secondary | ICD-10-CM | POA: Diagnosis not present

## 2023-08-31 LAB — HEMOGLOBIN AND HEMATOCRIT, BLOOD
HCT: 30 % — ABNORMAL LOW (ref 36.0–46.0)
Hemoglobin: 9.8 g/dL — ABNORMAL LOW (ref 12.0–15.0)

## 2023-08-31 LAB — PREPARE RBC (CROSSMATCH)

## 2023-08-31 LAB — CBC WITH DIFFERENTIAL/PLATELET
Abs Immature Granulocytes: 0.03 10*3/uL (ref 0.00–0.07)
Basophils Absolute: 0 10*3/uL (ref 0.0–0.1)
Basophils Relative: 1 %
Eosinophils Absolute: 0.4 10*3/uL (ref 0.0–0.5)
Eosinophils Relative: 7 %
HCT: 22.9 % — ABNORMAL LOW (ref 36.0–46.0)
Hemoglobin: 7.3 g/dL — ABNORMAL LOW (ref 12.0–15.0)
Immature Granulocytes: 1 %
Lymphocytes Relative: 15 %
Lymphs Abs: 0.8 10*3/uL (ref 0.7–4.0)
MCH: 29.7 pg (ref 26.0–34.0)
MCHC: 31.9 g/dL (ref 30.0–36.0)
MCV: 93.1 fL (ref 80.0–100.0)
Monocytes Absolute: 0.6 10*3/uL (ref 0.1–1.0)
Monocytes Relative: 12 %
Neutro Abs: 3.4 10*3/uL (ref 1.7–7.7)
Neutrophils Relative %: 64 %
Platelets: 217 10*3/uL (ref 150–400)
RBC: 2.46 MIL/uL — ABNORMAL LOW (ref 3.87–5.11)
RDW: 15.1 % (ref 11.5–15.5)
WBC: 5.2 10*3/uL (ref 4.0–10.5)
nRBC: 0 % (ref 0.0–0.2)

## 2023-08-31 MED ORDER — PANTOPRAZOLE SODIUM 40 MG PO TBEC
40.0000 mg | DELAYED_RELEASE_TABLET | Freq: Every day | ORAL | Status: DC
Start: 2023-08-31 — End: 2023-09-01
  Administered 2023-08-31 – 2023-09-01 (×2): 40 mg via ORAL
  Filled 2023-08-31 (×2): qty 1

## 2023-08-31 MED ORDER — SODIUM CHLORIDE 0.9% IV SOLUTION: Freq: Once | INTRAVENOUS | Status: AC

## 2023-08-31 MED ORDER — BISACODYL 10 MG RE SUPP
10.0000 mg | Freq: Once | RECTAL | Status: DC
  Filled 2023-08-31: qty 1

## 2023-08-31 NOTE — Telephone Encounter (Signed)
Left message for patient to call back  

## 2023-08-31 NOTE — Telephone Encounter (Signed)
Daughter [name]) wants Dr. Di Kindle opinion on taking the patient off of her warfarin/coumadin.  Daughter noted patient's hospital doctors (attending and gastro) are in favor of taking patient off of this medication.

## 2023-08-31 NOTE — Telephone Encounter (Signed)
Received call from patient's daughter.  Spoke to Dr. Cristal Deer regarding ED and gastro doctor agreeing with stopping Coumadin. Dr. Cristal Deer is also in agreement with plan. Made pt's daughter aware that inpatient cardiology may follow up as well. She is in agreement and verbally understands.

## 2023-08-31 NOTE — Plan of Care (Signed)
  Problem: Nutrition: Goal: Adequate nutrition will be maintained Outcome: Progressing   Problem: Pain Management: Goal: General experience of comfort will improve Outcome: Progressing   Problem: Safety: Goal: Ability to remain free from injury will improve Outcome: Progressing   Problem: Skin Integrity: Goal: Risk for impaired skin integrity will decrease Outcome: Progressing

## 2023-08-31 NOTE — Progress Notes (Signed)
PROGRESS NOTE    [name]  OAC:[id number] DOB: 1928-09-02 DOA: 08/27/2023 PCP: Camie Patience, FNP   Brief Narrative:  HPI: [name] is a 89 y.o. female with medical history significant of chronic use of 2 L/min of supplementary oxygen.  Patient has also had recurrent pulmonary embolism.  The last episode was more than 10 years ago.  Patient is chronically anticoagulated with warfarin.   Patient is alert and awake and partakes in the history however principally the history is provided by the daughter at the bedside.  Patient tends to have falls at home has had about 6 falls this year that the daughter can count.  However there are instances when the patient is by herself at home.  There is concern that the patient try and tends to minimize her symptoms.  Patient has had various bruising develop on her lower extremity that have been attributed to her falls.  Patient also had self-limiting episode of left nare bleed a few days ago.  Patient denies any vomiting denies any diarrhea.  No abdominal pain.  Patient was diagnosed with a urinary tract infection about 3 to 4 days ago.  Started treatment with doxycycline subsequently changed to ciprofloxacin.  Patient currently reports no dysuria no fevers no rigors.  Patient also was noted to have worse hyponatremia than her baseline and given a 5-day course of sodium chloride tablets.   Patient had workup done at primary care office yesterday with finding of hemoglobin of 7.1.  Patient referred to the ER for workup today.  No history of chest pain, new shortness of breath presyncope.  Patient does have chronic shortness of breath.  Assessment & Plan:   Principal Problem:   Anemia Active Problems:   Hyponatremia   UTI (urinary tract infection)   Chronic obstructive lung disease (HCC)   AKI on CKD-3B   Pressure injury of back, stage 2 (HCC)  Acute blood loss anemia secondary to upper GI bleed secondary to Coumadin use/hypercoagulable  state/supratherapeutic INR: This is rather incidentally found.  Patient has had several sites of blood loss including bruising in legs due to her fall, recent nare bleed.  No history of melena or hematochezia but FOBT positive indicating possible contribution from upper GI bleed secondary to anticoagulation/Coumadin use.  INR was supratherapeutic at 4.2 upon arrival, she received vitamin K and INR down to 3.5 today.  However hemoglobin dropped to under 6 from 7.9 at presentation, 1 unit of PRBC transfusion is ordered.  Admitting hospitalist discussed with the patient and daughter and they would not want a GI referral for potential colonoscopy or endoscopy for this.  I personally spoke to the daughter.  She said that she never said that she would not want GI on board or EGD for the patient, she was opposed to having colonoscopy.  She was okay with GI seeing the patient, Dr. Ewing Schlein saw patient, did not recommend any intervention.  Recommended holding anticoagulation which we are already doing.  Patient's hemoglobin improved to 8.3 after transfusion but dropped to 7.3 and it is again 7.3 today.  I had a lengthy discussion with the daughter over the phone on 08/30/2023 and per her, she did not receive any phone call from Dr. Ewing Schlein.  I informed her about his recommendations to discontinue Coumadin and likely continuing aspirin.  I have found out from her that patient's last PE was 20 years ago.  We discussed in length that now that she is 89 year old, has been falling  recurrently and is likely having GI bleed, the risks of continuing anticoagulation with Coumadin in her situation is far more than the benefits and she agreed with stopping Coumadin and resuming aspirin when safe.  We will however keep her in the hospital overnight and repeat CBC in the morning due to dropping hemoglobin.  Continue Protonix.  Will advance her to soft diet.  Since her hemoglobin is very close to 7 and she will be at high risk of readmission  if she were to be discharged, I am going to give her a unit of PRBC transfusion today and repeat CBC in the morning.  If her hemoglobin will be over 8, we will discharge her home tomorrow.  History of recurrent PEs: Coumadin on hold for active GI bleed.   UTI (urinary tract infection) This was diagnosed prior to admison.  She was initially treated with doxycycline and then ciprofloxacin.  UA upon arrival does not indicate infection however ciprofloxacin was continued and she completed the course.   Chronic hyponatremia: Upon chart review, it appears that she has chronic hyponatremia ranging anywhere from 120-132.  Per report patient had worse hyponatremia a few days ago and was treated with oral sodium chloride tablets.  Sodium hovering around 128-132.    Pressure injury of back, stage 2 (HCC) Patient was noted to have pressure injury stage II of the coccygeal area.  Further daughter shows pressure injury photo of left heel that is currently dressed.  We will request wound care evaluation.  Turn and position patient.   AKI on CKD-3B rule out.  CKD stage IIIa-3B Chart review indicates that patient's baseline hemoglobin ranges from 1-1.1 hovering between CKD stage IIIa and IIIb, she presented with 1.34, does not meet criteria for AKI.  She is almost at her baseline.  Avoid nephrotoxic agents.  Clinically she appears slightly dehydrated.  I will start her on normal saline gently at 75 cc/h for 20 hours and repeat labs in the morning.  Renal ultrasound shows medical renal disease.  No obstruction no hydronephrosis.  Chronic obstructive lung disease (HCC) [place] 2lpm oxygen.  Although chest x-ray shows pulmonary edema but clinically she does not appear to be fluid overloaded and lungs are clear to auscultation.  She is currently on 2 L of oxygen.  Recurrent falls: Seen by PT OT, home health PT is recommended.  Constipation: Dulcolax suppository.  DVT prophylaxis: SCDs Start: 08/27/23 2029   Code  Status: Limited: Do not attempt resuscitation (DNR) -DNR-LIMITED -Do Not Intubate/DNI   Family Communication:  None present at bedside.  Status is: Inpatient Remains inpatient appropriate because: Anemia, requiring blood transfusion.   Estimated body mass index is 27.34 kg/m as calculated from the following:   Height as of this encounter: 5\' 3"  (1.6 m).   Weight as of this encounter: 70 kg.  Pressure Injury 01/16/23 Heel Left Unstageable - Full thickness tissue loss in which the base of the injury is covered by slough (yellow, tan, gray, green or brown) and/or eschar (tan, brown or black) in the wound bed. (Active)  01/16/23 0022  Location: Heel  Location Orientation: Left  Staging: Unstageable - Full thickness tissue loss in which the base of the injury is covered by slough (yellow, tan, gray, green or brown) and/or eschar (tan, brown or black) in the wound bed.  Wound Description (Comments):   Present on Admission: Yes     Pressure Injury 01/16/23 Sacrum Deep Tissue Pressure Injury - Purple or maroon localized area of discolored  intact skin or blood-filled blister due to damage of underlying soft tissue from pressure and/or shear. (Active)  01/16/23 0025  Location: Sacrum  Location Orientation:   Staging: Deep Tissue Pressure Injury - Purple or maroon localized area of discolored intact skin or blood-filled blister due to damage of underlying soft tissue from pressure and/or shear.  Wound Description (Comments):   Present on Admission: Yes     Pressure Injury 05/06/23 Toe (Comment  which one) Anterior;Right Stage 2 -  Partial thickness loss of dermis presenting as a shallow open injury with a red, pink wound bed without slough. Full thickness wound, NOT a pressure injury (Active)  05/06/23 1500  Location: Toe (Comment  which one)  Location Orientation: Anterior;Right (second toe)  Staging: Stage 2 -  Partial thickness loss of dermis presenting as a shallow open injury with a red,  pink wound bed without slough.  Wound Description (Comments): Full thickness wound, NOT a pressure injury  Present on Admission: Yes     Pressure Injury 05/06/23 Toe (Comment  which one) Anterior;Left Unstageable - Full thickness tissue loss in which the base of the injury is covered by slough (yellow, tan, gray, green or brown) and/or eschar (tan, brown or black) in the wound bed. full t (Active)  05/06/23 1500  Location: Toe (Comment  which one)  Location Orientation: Anterior;Left  Staging: Unstageable - Full thickness tissue loss in which the base of the injury is covered by slough (yellow, tan, gray, green or brown) and/or eschar (tan, brown or black) in the wound bed.  Wound Description (Comments): full thickness wound, NOT a pressure injury  Present on Admission: Yes  Dressing Type Gauze (Comment) 08/30/23 0809     Pressure Injury 08/28/23 Buttocks Right;Left Stage 2 -  Partial thickness loss of dermis presenting as a shallow open injury with a red, pink wound bed without slough. R upper buttock 0.5 cm x 0.5 cm x 0.1 cm; L upper buttock 1 cm x 0.5 cm x 0.1 cm (Active)  08/28/23   Location: Buttocks  Location Orientation: Right;Left  Staging: Stage 2 -  Partial thickness loss of dermis presenting as a shallow open injury with a red, pink wound bed without slough.  Wound Description (Comments): R upper buttock 0.5 cm x 0.5 cm x 0.1 cm; L upper buttock 1 cm x 0.5 cm x 0.1 cm  Present on Admission: Yes  Dressing Type Foam - Lift dressing to assess site every shift 08/30/23 0809   Nutritional Assessment: Body mass index is 27.34 kg/m.[name] Kitchen Seen by dietician.  I agree with the assessment and plan as outlined below: Nutrition Status:        . Skin Assessment: I have examined the patient's skin and I agree with the wound assessment as performed by the wound care RN as outlined below: Pressure Injury 01/16/23 Heel Left Unstageable - Full thickness tissue loss in which the base of the  injury is covered by slough (yellow, tan, gray, green or brown) and/or eschar (tan, brown or black) in the wound bed. (Active)  01/16/23 0022  Location: Heel  Location Orientation: Left  Staging: Unstageable - Full thickness tissue loss in which the base of the injury is covered by slough (yellow, tan, gray, green or brown) and/or eschar (tan, brown or black) in the wound bed.  Wound Description (Comments):   Present on Admission: Yes     Pressure Injury 01/16/23 Sacrum Deep Tissue Pressure Injury - Purple or maroon localized area of discolored intact  skin or blood-filled blister due to damage of underlying soft tissue from pressure and/or shear. (Active)  01/16/23 0025  Location: Sacrum  Location Orientation:   Staging: Deep Tissue Pressure Injury - Purple or maroon localized area of discolored intact skin or blood-filled blister due to damage of underlying soft tissue from pressure and/or shear.  Wound Description (Comments):   Present on Admission: Yes     Pressure Injury 05/06/23 Toe (Comment  which one) Anterior;Right Stage 2 -  Partial thickness loss of dermis presenting as a shallow open injury with a red, pink wound bed without slough. Full thickness wound, NOT a pressure injury (Active)  05/06/23 1500  Location: Toe (Comment  which one)  Location Orientation: Anterior;Right (second toe)  Staging: Stage 2 -  Partial thickness loss of dermis presenting as a shallow open injury with a red, pink wound bed without slough.  Wound Description (Comments): Full thickness wound, NOT a pressure injury  Present on Admission: Yes     Pressure Injury 05/06/23 Toe (Comment  which one) Anterior;Left Unstageable - Full thickness tissue loss in which the base of the injury is covered by slough (yellow, tan, gray, green or brown) and/or eschar (tan, brown or black) in the wound bed. full t (Active)  05/06/23 1500  Location: Toe (Comment  which one)  Location Orientation: Anterior;Left  Staging:  Unstageable - Full thickness tissue loss in which the base of the injury is covered by slough (yellow, tan, gray, green or brown) and/or eschar (tan, brown or black) in the wound bed.  Wound Description (Comments): full thickness wound, NOT a pressure injury  Present on Admission: Yes  Dressing Type Gauze (Comment) 08/30/23 0809     Pressure Injury 08/28/23 Buttocks Right;Left Stage 2 -  Partial thickness loss of dermis presenting as a shallow open injury with a red, pink wound bed without slough. R upper buttock 0.5 cm x 0.5 cm x 0.1 cm; L upper buttock 1 cm x 0.5 cm x 0.1 cm (Active)  08/28/23   Location: Buttocks  Location Orientation: Right;Left  Staging: Stage 2 -  Partial thickness loss of dermis presenting as a shallow open injury with a red, pink wound bed without slough.  Wound Description (Comments): R upper buttock 0.5 cm x 0.5 cm x 0.1 cm; L upper buttock 1 cm x 0.5 cm x 0.1 cm  Present on Admission: Yes  Dressing Type Foam - Lift dressing to assess site every shift 08/30/23 0809    Consultants:  GI  Procedures:  None  Antimicrobials:  Anti-infectives (From admission, onward)    Start     Dose/Rate Route Frequency Ordered Stop   08/27/23 2215  ciprofloxacin (CIPRO) tablet 250 mg        250 mg Oral Daily at 10 pm 08/27/23 2212 08/29/23 2126   08/27/23 2145  ciprofloxacin (CIPRO) tablet 250 mg  Status:  Discontinued        250 mg Oral 2 times daily 08/27/23 2144 08/27/23 2211         Subjective: Patient seen and examined.  She complains of feeling tired and lack of energy.  No other complaint.  Objective: Vitals:   08/30/23 1607 08/30/23 2010 08/31/23 0420 08/31/23 1409  BP: (!) 118/54 (!) 130/51 (!) 119/48 (!) 148/56  Pulse: 76 66 64 65  Resp:  19 19 16   Temp: 98.6 F (37 C) (!) 97.5 F (36.4 C) 97.7 F (36.5 C) 98.5 F (36.9 C)  TempSrc: Oral Oral Oral Oral  SpO2: 100% 99% 99% 97%  Weight:      Height:        Intake/Output Summary (Last 24 hours) at  08/31/2023 1421 Last data filed at 08/31/2023 3016 Gross per 24 hour  Intake --  Output 400 ml  Net -400 ml   Filed Weights   08/27/23 1751  Weight: 70 kg    Examination:  General exam: Appears calm and comfortable  Respiratory system: Clear to auscultation. Respiratory effort normal. Cardiovascular system: S1 & S2 heard, RRR. No JVD, murmurs, rubs, gallops or clicks. No pedal edema. Gastrointestinal system: Abdomen is nondistended, soft and nontender. No organomegaly or masses felt. Normal bowel sounds heard. Central nervous system: Alert and oriented. No focal neurological deficits. Extremities: Symmetric 5 x 5 power. Skin: Multiple bruises in bilateral upper and lower extremities which are improving.   Data Reviewed: I have personally reviewed following labs and imaging studies  CBC: Recent Labs  Lab 08/27/23 1806 08/28/23 0642 08/28/23 1843 08/29/23 0818 08/29/23 1703 08/30/23 0547 08/31/23 0509  WBC 7.8 5.9  --  5.4 6.5 4.8 5.2  NEUTROABS 5.5  --   --  3.7 4.8 3.2 3.4  HGB 7.9* 5.8* 8.3* 7.3* 8.2* 7.3* 7.3*  HCT 25.1* 18.5* 26.3* 23.6* 25.7* 22.7* 22.9*  MCV 93.7 92.5  --  93.3 92.1 92.7 93.1  PLT 291 231  --  221 244 222 217   Basic Metabolic Panel: Recent Labs  Lab 08/27/23 1806 08/28/23 0642 08/29/23 0818 08/30/23 0547  NA 132* 128* 128* 129*  K 3.3* 3.9 4.1 4.5  CL 94* 94* 95* 98  CO2 28 26 26 22   GLUCOSE 109* 90 87 79  BUN 19 18 14 12   CREATININE 1.34* 1.30* 1.25* 1.10*  CALCIUM 9.2 8.5* 8.6* 9.1   GFR: Estimated Creatinine Clearance: 30 mL/min (A) (by C-G formula based on SCr of 1.1 mg/dL (H)). Liver Function Tests: Recent Labs  Lab 08/27/23 1806 08/28/23 0642  AST 30 24  ALT 16 13  ALKPHOS 135* 100  BILITOT 0.8 1.1  PROT 7.3 5.7*  ALBUMIN 3.0* 2.3*   No results for input(s): "LIPASE", "AMYLASE" in the last 168 hours. No results for input(s): "AMMONIA" in the last 168 hours. Coagulation Profile: Recent Labs  Lab 08/27/23 1806  08/28/23 0642  INR 4.2* 3.5*   Cardiac Enzymes: No results for input(s): "CKTOTAL", "CKMB", "CKMBINDEX", "TROPONINI" in the last 168 hours. BNP (last 3 results) No results for input(s): "PROBNP" in the last 8760 hours. HbA1C: No results for input(s): "HGBA1C" in the last 72 hours. CBG: No results for input(s): "GLUCAP" in the last 168 hours. Lipid Profile: No results for input(s): "CHOL", "HDL", "LDLCALC", "TRIG", "CHOLHDL", "LDLDIRECT" in the last 72 hours. Thyroid Function Tests: No results for input(s): "TSH", "T4TOTAL", "FREET4", "T3FREE", "THYROIDAB" in the last 72 hours.  Anemia Panel: No results for input(s): "VITAMINB12", "FOLATE", "FERRITIN", "TIBC", "IRON", "RETICCTPCT" in the last 72 hours.  Sepsis Labs: No results for input(s): "PROCALCITON", "LATICACIDVEN" in the last 168 hours.  No results found for this or any previous visit (from the past 240 hours).   Radiology Studies: No results found.   Scheduled Meds:  atorvastatin  10 mg Oral Daily   bisacodyl  10 mg Rectal Once   diclofenac Sodium  4 g Topical QID   gabapentin  300 mg Oral BID   Gerhardt's butt cream   Topical BID   levothyroxine  100 mcg Oral QAC breakfast   pantoprazole  40 mg Oral  Daily   sodium chloride flush  3 mL Intravenous Q12H   spironolactone  12.5 mg Oral Daily   Continuous Infusions:   LOS: 4 days   Hughie Closs, MD [facility]  08/31/2023, 2:21 PM   *Please note that this is a verbal dictation therefore any spelling or grammatical errors are due to the "Dragon [employer] One" system interpretation.  Please page via Amion and do not message via secure chat for urgent patient care matters. Secure chat can be used for non urgent patient care matters.  How to contact the [facility] Attending or Consulting provider 7A - 7P or covering provider during after hours 7P -7A, for this patient?  Check the care team in [facility] and look for a) attending/consulting [facility] provider listed and b) the [facility]  team listed. Page or secure chat 7A-7P. Log into [email or web address] and use [place]'s universal password to access. If you do not have the password, please contact the hospital operator. Locate the [facility] provider you are looking for under [facility] and page to a number that you can be directly reached. If you still have difficulty reaching the provider, please page the [facility] (Director on Call) for the Hospitalists listed on amion for assistance.

## 2023-09-01 DIAGNOSIS — D649 Anemia, unspecified: Secondary | ICD-10-CM | POA: Diagnosis not present

## 2023-09-01 LAB — TYPE AND SCREEN
ABO/RH(D): A POS
Antibody Screen: POSITIVE
Unit division: 0

## 2023-09-01 LAB — CBC WITH DIFFERENTIAL/PLATELET
Abs Immature Granulocytes: 0.03 10*3/uL (ref 0.00–0.07)
Basophils Absolute: 0 10*3/uL (ref 0.0–0.1)
Basophils Relative: 1 %
Eosinophils Absolute: 0.4 10*3/uL (ref 0.0–0.5)
Eosinophils Relative: 6 %
HCT: 28.2 % — ABNORMAL LOW (ref 36.0–46.0)
Hemoglobin: 9 g/dL — ABNORMAL LOW (ref 12.0–15.0)
Immature Granulocytes: 1 %
Lymphocytes Relative: 13 %
Lymphs Abs: 0.8 10*3/uL (ref 0.7–4.0)
MCH: 29.9 pg (ref 26.0–34.0)
MCHC: 31.9 g/dL (ref 30.0–36.0)
MCV: 93.7 fL (ref 80.0–100.0)
Monocytes Absolute: 0.7 10*3/uL (ref 0.1–1.0)
Monocytes Relative: 10 %
Neutro Abs: 4.5 10*3/uL (ref 1.7–7.7)
Neutrophils Relative %: 69 %
Platelets: 244 10*3/uL (ref 150–400)
RBC: 3.01 MIL/uL — ABNORMAL LOW (ref 3.87–5.11)
RDW: 15.3 % (ref 11.5–15.5)
WBC: 6.4 10*3/uL (ref 4.0–10.5)
nRBC: 0 % (ref 0.0–0.2)

## 2023-09-01 LAB — BPAM RBC
Blood Product Expiration Date: 202501192359
ISSUE DATE / TIME: 202412231400
Unit Type and Rh: 6200

## 2023-09-01 MED ORDER — PANTOPRAZOLE SODIUM 40 MG PO TBEC: 40.0000 mg | DELAYED_RELEASE_TABLET | Freq: Two times a day (BID) | ORAL | 0 refills | Status: AC

## 2023-09-01 NOTE — Progress Notes (Signed)
Physical Therapy Treatment Patient Details Name: [name] MRN: [id number] DOB: [birth year] Today's Date: 09/01/2023   History of Present Illness Patient 89 year old female admitted 08/27/23 for Acute blood loss anemia secondary to upper GI bleed secondary to Coumadin use/hypercoagulable state/supratherapeutic INR. Past medical history significant of chronic use of 2 L/min of supplementary oxygen, CHF, dysphagia, hiatal hernia, arrhythmia, afib.  Patient has also had recurrent pulmonary embolism.    PT Comments  Pt declined participation with PT despite encouragement. She did agree to stand up long enough for me to change pad in her recliner that was soiled with urine. She did allow me to reposition her in recliner. MD arrived towards end of session-made him aware that pt continues to have weakness and that she declined PT participation. MD reports he plans to d/c her on today. Pt reports she lives with family and plans to return upon discharge. Continue to recommend HHPT f/u as long as pt will have 24/7 supervision/assist and family can provide current level of assist. Will continue to follow pt during this hospital stay.    If plan is discharge home, recommend the following: A lot of help with walking and/or transfers;A lot of help with bathing/dressing/bathroom;Assistance with cooking/housework;Assist for transportation;Help with stairs or ramp for entrance   Can travel by private vehicle        Equipment Recommendations  None recommended by PT    Recommendations for Other Services       Precautions / Restrictions Precautions Precautions: Fall Precaution Comments: 2L O2 baseline Restrictions Weight Bearing Restrictions Per Provider Order: No     Mobility  Bed Mobility               General bed mobility comments: pt oob in recliner    Transfers Overall transfer level: Needs assistance Equipment used: Rolling walker (2 wheels) Transfers: Sit to/from Stand Sit to  Stand: Mod assist           General transfer comment: Assist to rise, steady, control descent. Cues for safety, technique, hand placement. Pt stood for ~15 seconds before having to sit down due to fatigue, weakness.    Ambulation/Gait               General Gait Details: NT-pt declined ambulation despite encouragement   Stairs             Wheelchair Mobility     Tilt Bed    Modified Rankin (Stroke Patients Only)       Balance Overall balance assessment: Needs assistance         Standing balance support: Bilateral upper extremity supported Standing balance-Leahy Scale: Poor                              Cognition   Behavior During Therapy: WFL for tasks assessed/performed Overall Cognitive Status: No family/caregiver present to determine baseline cognitive functioning                                          Exercises      General Comments        Pertinent Vitals/Pain Pain Assessment Pain Assessment: Faces Faces Pain Scale: No hurt    Home Living  Prior Function            PT Goals (current goals can now be found in the care plan section) Progress towards PT goals: Progressing toward goals    Frequency    Min 1X/week      PT Plan      Co-evaluation              AM-PAC PT "6 Clicks" Mobility   Outcome Measure  Help needed turning from your back to your side while in a flat bed without using bedrails?: A Lot Help needed moving from lying on your back to sitting on the side of a flat bed without using bedrails?: A Lot Help needed moving to and from a bed to a chair (including a wheelchair)?: A Lot Help needed standing up from a chair using your arms (e.g., wheelchair or bedside chair)?: A Lot Help needed to walk in hospital room?: A Lot Help needed climbing 3-5 steps with a railing? : A Lot 6 Click Score: 12    End of Session Equipment Utilized During  Treatment: Gait belt;Oxygen Activity Tolerance: Patient limited by fatigue Patient left: in chair;with call bell/phone within reach   PT Visit Diagnosis: Difficulty in walking, not elsewhere classified (R26.2);Muscle weakness (generalized) (M62.81)     Time: 9381-8299 PT Time Calculation (min) (ACUTE ONLY): 12 min  Charges:    $Therapeutic Activity: 8-22 mins PT General Charges $$ ACUTE PT VISIT: 1 Visit                         Faye Ramsay, PT Acute Rehabilitation  Office: [phone]

## 2023-09-01 NOTE — TOC Transition Note (Signed)
Transition of Care [facility]) - Discharge Note   Patient Details  Name: [name] MRN: [id number] Date of Birth: 05-May-1928  Transition of Care [facility]) CM/SW Contact:  Larrie Kass, LCSW Phone Number: 09/01/2023, 11:30 AM   Clinical Narrative:    CSW spoke with pt's granddaughter Olegario Messier , she deferred d/c planning to pt's daughter [name]. CSW attempted to contact Pt's daughter,  left VM requesting a return call.    1:40pm  CSW spoke with pt's daughter to discuss Home health rec. Pt's daughter stated she would like [facility] as pt had the agency in the past. Pt's daughter stated she will provide transportation at time of d/c. She inquired about pt's blood work and requested to speak with someone about this, pt's RN was made aware.   [facility] was able to accept pt for HHPT,OT,and RN. No further needs , TOC sign off.   Final next level of care: Home w Home Health Services     Patient Goals and CMS Choice            Discharge Placement                  Name of family member notified: Kerrin Mo (Daughter)  [phone] (Mobile) Patient and family notified of of transfer: 09/01/23  Discharge Plan and Services Additional resources added to the After Visit Summary for                                       Social Drivers of Health (SDOH) Interventions SDOH Screenings   Food Insecurity: No Food Insecurity (08/28/2023)  Housing: Low Risk  (08/28/2023)  Transportation Needs: No Transportation Needs (08/28/2023)  Utilities: Not At Risk (08/28/2023)  Tobacco Use: Medium Risk (08/28/2023)     Readmission Risk Interventions    11/18/2021   12:55 PM  Readmission Risk Prevention Plan  Transportation Screening Complete  PCP or Specialist Appt within 3-5 Days Complete  HRI or Home Care Consult Complete  Social Work Consult for Recovery Care Planning/Counseling Complete  Palliative Care Screening Complete  Medication Review Furniture conservator/restorer) Complete

## 2023-09-01 NOTE — Discharge Summary (Addendum)
Physician Discharge Summary  [name] UEA:[id number] DOB: 04-Dec-1927 DOA: 08/27/2023  PCP: Camie Patience, FNP  Admit date: 08/27/2023 Discharge date: 09/01/2023 30 Day Unplanned Readmission Risk Score    Flowsheet Row ED to Hosp-Admission (Current) from 08/27/2023 in [facility] 4TH FLOOR PROGRESSIVE CARE AND UROLOGY  30 Day Unplanned Readmission Risk Score (%) 20.11 Filed at 09/01/2023 0801       This score is the patient's risk of an unplanned readmission within 30 days of being discharged (0 -100%). The score is based on dignosis, age, lab data, medications, orders, and past utilization.   Low:  0-14.9   Medium: 15-21.9   High: 22-29.9   Extreme: 30 and above          Admitted From: Home Disposition: Home  Recommendations for Outpatient Follow-up:  Follow up with PCP in 1-2 weeks Please obtain BMP/CBC in one week Please follow up with your PCP on the following pending results: Unresulted Labs (From admission, onward)     Start     Ordered   08/28/23 0500  Protein electrophoresis, serum  Tomorrow morning,   R        08/27/23 2146              Home Health: Yes Equipment/Devices: None  Discharge Condition: Stable CODE STATUS: DNR  Diet recommendation: Cardiac  Subjective: Seen and examined.  No complaints other than generalized weakness which is improving.  Brief/Interim Summary: [name] is a 89 y.o. female with medical history significant of chronic use of 2 L/min of supplementary oxygen.  Patient has also had recurrent pulmonary embolism and for that, patient is chronically anticoagulated with warfarin. Patient tends to have falls at home has had about 6 falls this year that the daughter can count.  However there are instances when the patient is by herself at home.  There is concern that the patient try and tends to minimize her symptoms.  Patient has had various bruising develop on her lower extremity that have been attributed to her falls.  Patient  also had self-limiting episode of left nare bleed a few days ago.   No abdominal pain.  Patient was diagnosed with a urinary tract infection about 3 to 4 days ago.  Started treatment with doxycycline subsequently changed to ciprofloxacin.  Patient denied urinary complaints.  Patient also was noted to have worse hyponatremia than her baseline and given a 5-day course of sodium chloride tablets.   Patient had workup done at primary care office and was found to have hemoglobin of 7.1.  Patient referred to the ER for workup today.  No history of chest pain, new shortness of breath presyncope.  Patient does have chronic shortness of breath.  Details below.   Acute blood loss anemia secondary to upper GI bleed secondary to Coumadin use/hypercoagulable state/supratherapeutic INR: This is rather incidentally found.  Patient has had several sites of blood loss including bruising in legs due to her fall, recent nare bleed.  Daughter reported black stools few days ago and FOBT positive indicating possible contribution from upper GI bleed secondary to anticoagulation/Coumadin use.  INR was supratherapeutic at 4.2 upon arrival, she received vitamin K and INR down. However hemoglobin dropped to under 6 from 7.9 at presentation, 1 unit of PRBC transfusion given.  GI consulted, Dr. Ewing Schlein saw patient, did not recommend any intervention.  Recommended holding anticoagulation. Patient's hemoglobin improved to 8.3 after transfusion but dropped to 7.3 and remained stable to 7.3 on 08/31/2023  for which I gave her another blood unit transfusion in order to prepare her for discharge today.  I had a lengthy discussion with the daughter over the phone on 08/30/2023 and I informed her about GI recommendations to discontinue Coumadin and likely continuing aspirin and GI did not offer any intervention.  I have found out from her that patient's last PE was 20 years ago.  We discussed in length that now that she is 89 year old, has been  falling recurrently and is likely having GI bleed, the risks of continuing anticoagulation with Coumadin in her situation is far more than the benefits and she agreed with stopping Coumadin and resuming aspirin when safe.  It looks like she also consulted with patient's primary oncologist Dr. Di Kindle office and she was given the similar recommendations.  Patient is now stable, hemoglobin over 8.5 so she is going to be discharged home.  I have advised her to resume aspirin a week after today only if her hemoglobin is stable when it is checked in a week at PCPs office and PCP clears to resume aspirin.  In order to reduce upper GI bleed and to heal and the ulcer if there is any, I have decided to send her home on increased dosage of omeprazole to 40 mg p.o. twice daily for a month and then to resume back to 40 mg p.o. once daily.   History of recurrent PEs: Discontinuing Coumadin as mentioned above.   UTI (urinary tract infection) This was diagnosed prior to admison.  She was initially treated with doxycycline and then ciprofloxacin.  UA upon arrival does not indicate infection however ciprofloxacin was continued and she completed the course.   Chronic hyponatremia: Upon chart review, it appears that she has chronic hyponatremia ranging anywhere from 120-132.  Per report patient had worse hyponatremia a few days ago and was treated with oral sodium chloride tablets.  Sodium hovering around 128-132.    Pressure injury of back, stage 2 (HCC) Patient was noted to have pressure injury stage II of the coccygeal area.  Further daughter shows pressure injury photo of left heel that is currently dressed.  We will request wound care evaluation.  Turn and position patient.   AKI on CKD-3B rule out.  CKD stage IIIa-3B Chart review indicates that patient's baseline hemoglobin ranges from 1-1.1 hovering between CKD stage IIIa and IIIb, she presented with 1.34, does not meet criteria for AKI.  She is almost at  her baseline.    Chronic obstructive lung disease (HCC) [place] 2lpm oxygen.  Although chest x-ray shows pulmonary edema but clinically she does not appear to be fluid overloaded and lungs are clear to auscultation.  She is currently on 2 L of oxygen.   Recurrent falls: Seen by PT OT, home health PT is recommended.   Constipation: Dulcolax suppository given   Discharge plan was discussed with patient and/or family member and they verbalized understanding and agreed with it.  Discharge Diagnoses:  Principal Problem:   Anemia Active Problems:   Hyponatremia   UTI (urinary tract infection)   Chronic obstructive lung disease (HCC)   AKI on CKD-3B   Pressure injury of back, stage 2 (HCC)    Discharge Instructions   Allergies as of 09/01/2023       Reactions   Amoxicillin Other (See Comments)   Unknown reaction  Tolerates Keflex, Cefepime   Azithromycin Other (See Comments)   Unknown reaction    Codeine Other (See Comments)   Unknown reaction Other  Reaction(s): Not available, Not available, Not available, Not available, Not available, Not available, Not available   Erythromycin Other (See Comments)   Unknown reaction    Green Dyes Other (See Comments)   Allergic to ALL dyes   Iodine Other (See Comments)   Unknown reaction    Misc. Sulfonamide Containing Compounds    Oxycodone Other (See Comments)   Unknown reaction    Oxycodone-acetaminophen Other (See Comments)   Unknown reaction    Penicillins Other (See Comments)   Unknown reaction  Tolerates Keflex, Cefepime   Sulfa Antibiotics    Per Pt's daughter - unknown reaction    Sulfasalazine    Unsure of allergy    Tramadol Hives        Medication List     PAUSE taking these medications    aspirin EC 81 MG tablet Wait to take this until: September 08, 2023 Take 1 tablet (81 mg total) by mouth daily. Swallow whole.       STOP taking these medications    ciprofloxacin 250 MG tablet Commonly known as:  CIPRO   cyanocobalamin 1000 MCG tablet Commonly known as: VITAMIN B12   doxycycline 100 MG tablet Commonly known as: VIBRA-TABS   sodium chloride 1 g tablet   warfarin 1 MG tablet Commonly known as: COUMADIN   zinc gluconate 50 MG tablet       TAKE these medications    acetaminophen 325 MG tablet Commonly known as: TYLENOL Take 2 tablets (650 mg total) by mouth every 6 (six) hours as needed for mild pain or headache. What changed: reasons to take this   ascorbic acid 500 MG tablet Commonly known as: VITAMIN C Take 500 mg by mouth See admin instructions. Pts family says that is it "sporatic"   atorvastatin 10 MG tablet Commonly known as: Lipitor Take 1 tablet (10 mg total) by mouth daily.   bumetanide 0.5 MG tablet Commonly known as: BUMEX Take 0.5 mg by mouth in the morning and at bedtime.   carboxymethylcellulose 0.5 % Soln Commonly known as: REFRESH PLUS Place 1 drop into both eyes 2 (two) times daily.   diclofenac Sodium 1 % Gel Commonly known as: VOLTAREN Apply 4 g topically 4 (four) times daily. To bilateral feet and knees What changed:  when to take this reasons to take this   estradiol 0.1 MG/GM vaginal cream Commonly known as: ESTRACE Place 1 Applicatorful vaginally daily.   gabapentin 300 MG capsule Commonly known as: NEURONTIN Take 300 mg by mouth 2 (two) times daily.   levothyroxine 100 MCG tablet Commonly known as: SYNTHROID Take 100 mcg by mouth daily before breakfast.   ondansetron 4 MG tablet Commonly known as: ZOFRAN Take 4 mg by mouth daily as needed.   OVER THE COUNTER MEDICATION Take 1 capsule by mouth at bedtime. Equate Neuriva   pantoprazole 40 MG tablet Commonly known as: PROTONIX Take 1 tablet (40 mg total) by mouth 2 (two) times daily. What changed: when to take this   PreserVision/Lutein Caps Take 1 capsule by mouth 2 (two) times daily.   senna-docusate 8.6-50 MG tablet Commonly known as: Senokot-S Take 1 tablet by  mouth 2 (two) times daily between meals as needed for mild constipation. What changed: when to take this   spironolactone 25 MG tablet Commonly known as: ALDACTONE Take 12.5 mg by mouth daily.        Follow-up Information     Camie Patience, FNP Follow up in 1 week(s).  Specialty: Family Medicine Contact information: [address] [place] [address] [phone]                Allergies  Allergen Reactions   Amoxicillin Other (See Comments)    Unknown reaction  Tolerates Keflex, Cefepime   Azithromycin Other (See Comments)    Unknown reaction    Codeine Other (See Comments)    Unknown reaction  Other Reaction(s): Not available, Not available, Not available, Not available, Not available, Not available, Not available   Erythromycin Other (See Comments)    Unknown reaction    Green Dyes Other (See Comments)    Allergic to ALL dyes   Iodine Other (See Comments)    Unknown reaction    Misc. Sulfonamide Containing Compounds    Oxycodone Other (See Comments)    Unknown reaction      Oxycodone-Acetaminophen Other (See Comments)    Unknown reaction    Penicillins Other (See Comments)    Unknown reaction  Tolerates Keflex, Cefepime   Sulfa Antibiotics     Per Pt's daughter - unknown reaction    Sulfasalazine     Unsure of allergy    Tramadol Hives    Consultations: GI   Procedures/Studies: US RENAL Result Date: 08/28/2023 CLINICAL DATA:  Acute kidney injury EXAM: RENAL / URINARY TRACT ULTRASOUND COMPLETE COMPARISON:  CT abdomen and pelvis 01/15/2023 FINDINGS: Right Kidney: Renal measurements: 7.8 x 4.2 x 4.5 cm = volume: 78 mL. Echogenicity is increased. No mass or hydronephrosis visualized. Left Kidney: Renal measurements: 8.9 x 4.7 x 3.9 cm = volume: 86 mL. Echogenicity is increased. There is no hydronephrosis. There is a 3.3 x 3.4 x 3.0 cm simple cyst in the inferior pole of the left kidney. Bladder: Appears normal for degree of  bladder distention. Other: None. IMPRESSION: 1. No hydronephrosis. 2. Increased renal echogenicity bilaterally consistent with medical renal disease. 3. Simple cyst in the inferior pole of the left kidney. Electronically Signed   By: Darliss Cheney M.D.   On: 08/28/2023 00:07   DG CHEST PORT 1 VIEW Result Date: 08/27/2023 CLINICAL DATA:  Shortness of breath.  COPD exacerbation. EXAM: PORTABLE CHEST 1 VIEW COMPARISON:  05/05/2023 FINDINGS: Stable cardiomegaly. Aortic atherosclerotic calcification. Hazy bilateral airspace and interstitial opacities greatest in the lower lungs. Small bilateral pleural effusions. Advanced arthritis both shoulders. IMPRESSION: Cardiomegaly with pulmonary edema and small bilateral pleural effusions. Electronically Signed   By: Minerva Fester M.D.   On: 08/27/2023 23:40     Discharge Exam: Vitals:   08/31/23 2111 09/01/23 0636  BP: 118/65 (!) 125/50  Pulse: 76 61  Resp: (!) 22 20  Temp: 98.4 F (36.9 C) 98.1 F (36.7 C)  SpO2: 100% 98%   Vitals:   08/31/23 1425 08/31/23 1700 08/31/23 2111 09/01/23 0636  BP: 129/63 106/75 118/65 (!) 125/50  Pulse: 75 64 76 61  Resp: (!) 22 18 (!) 22 20  Temp: 98.7 F (37.1 C) 98.5 F (36.9 C) 98.4 F (36.9 C) 98.1 F (36.7 C)  TempSrc: Oral Oral Oral Oral  SpO2: 100% 100% 100% 98%  Weight:      Height:        General: Pt is alert, awake, not in acute distress Cardiovascular: RRR, S1/S2 +, no rubs, no gallops Respiratory: CTA bilaterally, no wheezing, no rhonchi Abdominal: Soft, NT, ND, bowel sounds + Extremities: no edema, no cyanosis    The results of significant diagnostics from this hospitalization (including imaging, microbiology, ancillary and laboratory)  are listed below for reference.     Microbiology: No results found for this or any previous visit (from the past 240 hours).   Labs: BNP (last 3 results) Recent Labs    10/23/22 2152 11/01/22 0720 08/27/23 1806  BNP 373.1* 329.2* 264.7*   Basic  Metabolic Panel: Recent Labs  Lab 08/27/23 1806 08/28/23 0642 08/29/23 0818 08/30/23 0547  NA 132* 128* 128* 129*  K 3.3* 3.9 4.1 4.5  CL 94* 94* 95* 98  CO2 28 26 26 22   GLUCOSE 109* 90 87 79  BUN 19 18 14 12   CREATININE 1.34* 1.30* 1.25* 1.10*  CALCIUM 9.2 8.5* 8.6* 9.1   Liver Function Tests: Recent Labs  Lab 08/27/23 1806 08/28/23 0642  AST 30 24  ALT 16 13  ALKPHOS 135* 100  BILITOT 0.8 1.1  PROT 7.3 5.7*  ALBUMIN 3.0* 2.3*   No results for input(s): "LIPASE", "AMYLASE" in the last 168 hours. No results for input(s): "AMMONIA" in the last 168 hours. CBC: Recent Labs  Lab 08/29/23 0818 08/29/23 1703 08/30/23 0547 08/31/23 0509 08/31/23 1902 09/01/23 0855  WBC 5.4 6.5 4.8 5.2  --  6.4  NEUTROABS 3.7 4.8 3.2 3.4  --  4.5  HGB 7.3* 8.2* 7.3* 7.3* 9.8* 9.0*  HCT 23.6* 25.7* 22.7* 22.9* 30.0* 28.2*  MCV 93.3 92.1 92.7 93.1  --  93.7  PLT 221 244 222 217  --  244   Cardiac Enzymes: No results for input(s): "CKTOTAL", "CKMB", "CKMBINDEX", "TROPONINI" in the last 168 hours. BNP: Invalid input(s): "POCBNP" CBG: No results for input(s): "GLUCAP" in the last 168 hours. D-Dimer No results for input(s): "DDIMER" in the last 72 hours. Hgb A1c No results for input(s): "HGBA1C" in the last 72 hours. Lipid Profile No results for input(s): "CHOL", "HDL", "LDLCALC", "TRIG", "CHOLHDL", "LDLDIRECT" in the last 72 hours. Thyroid function studies No results for input(s): "TSH", "T4TOTAL", "T3FREE", "THYROIDAB" in the last 72 hours.  Invalid input(s): "FREET3" Anemia work up No results for input(s): "VITAMINB12", "FOLATE", "FERRITIN", "TIBC", "IRON", "RETICCTPCT" in the last 72 hours. Urinalysis    Component Value Date/Time   COLORURINE YELLOW 08/28/2023 1020   APPEARANCEUR CLEAR 08/28/2023 1020   LABSPEC 1.009 08/28/2023 1020   PHURINE 7.0 08/28/2023 1020   GLUCOSEU NEGATIVE 08/28/2023 1020   HGBUR MODERATE (A) 08/28/2023 1020   BILIRUBINUR NEGATIVE 08/28/2023  1020   KETONESUR NEGATIVE 08/28/2023 1020   PROTEINUR NEGATIVE 08/28/2023 1020   NITRITE NEGATIVE 08/28/2023 1020   LEUKOCYTESUR LARGE (A) 08/28/2023 1020   Sepsis Labs Recent Labs  Lab 08/29/23 1703 08/30/23 0547 08/31/23 0509 09/01/23 0855  WBC 6.5 4.8 5.2 6.4   Microbiology No results found for this or any previous visit (from the past 240 hours).  FURTHER DISCHARGE INSTRUCTIONS:   Get Medicines reviewed and adjusted: Please take all your medications with you for your next visit with your Primary MD   Laboratory/radiological data: Please request your Primary MD to go over all hospital tests and procedure/radiological results at the follow up, please ask your Primary MD to get all Hospital records sent to his/her office.   In some cases, they will be blood work, cultures and biopsy results pending at the time of your discharge. Please request that your primary care M.D. goes through all the records of your hospital data and follows up on these results.   Also Note the following: If you experience worsening of your admission symptoms, develop shortness of breath, life threatening emergency, suicidal or homicidal  thoughts you must seek medical attention immediately by calling 911 or calling your MD immediately  if symptoms less severe.   You must read complete instructions/literature along with all the possible adverse reactions/side effects for all the Medicines you take and that have been prescribed to you. Take any new Medicines after you have completely understood and accpet all the possible adverse reactions/side effects.    Do not drive when taking Pain medications or sleeping medications (Benzodaizepines)   Do not take more than prescribed Pain, Sleep and Anxiety Medications. It is not advisable to combine anxiety,sleep and pain medications without talking with your primary care practitioner   Special Instructions: If you have smoked or chewed Tobacco  in the last 2 yrs  please stop smoking, stop any regular Alcohol  and or any Recreational drug use.   Wear Seat belts while driving.   Please note: You were cared for by a hospitalist during your hospital stay. Once you are discharged, your primary care physician will handle any further medical issues. Please note that NO REFILLS for any discharge medications will be authorized once you are discharged, as it is imperative that you return to your primary care physician (or establish a relationship with a primary care physician if you do not have one) for your post hospital discharge needs so that they can reassess your need for medications and monitor your lab values  Time coordinating discharge: Over 30 minutes  SIGNED:   Hughie Closs, MD  [facility] 09/01/2023, 12:03 PM *Please note that this is a verbal dictation therefore any spelling or grammatical errors are due to the "[employer] One" system interpretation. If 7PM-7AM, please contact night-coverage [email or web address]

## 2023-09-01 NOTE — Discharge Instructions (Addendum)
We have discontinued your Coumadin permanently after discussing with your daughter.  Please hold your aspirin and resume after 1 week if hemoglobin remains stable and cleared by PCP. I have increased your omeprazole 40 mg to take twice daily for a month and then after that, resume back to 40 mg once daily.

## 2023-09-01 NOTE — Progress Notes (Signed)
Discharge instructions reviewed with patient's daughter. Follow up appts discussed. IV site D/C'd. Pt stable at this time and discharged to home with family with home O2 in place.

## 2023-09-06 LAB — PROTEIN ELECTROPHORESIS, SERUM
A/G Ratio: 0.8 (ref 0.7–1.7)
Albumin ELP: 2.3 g/dL — ABNORMAL LOW (ref 2.9–4.4)
Alpha-1-Globulin: 0.3 g/dL (ref 0.0–0.4)
Alpha-2-Globulin: 0.8 g/dL (ref 0.4–1.0)
Beta Globulin: 1 g/dL (ref 0.7–1.3)
Gamma Globulin: 0.9 g/dL (ref 0.4–1.8)
Globulin, Total: 3 g/dL (ref 2.2–3.9)
Total Protein ELP: 5.3 g/dL — ABNORMAL LOW (ref 6.0–8.5)

## 2023-09-10 ENCOUNTER — Encounter (HOSPITAL_BASED_OUTPATIENT_CLINIC_OR_DEPARTMENT_OTHER): Payer: Medicare HMO | Attending: General Surgery | Admitting: General Surgery

## 2023-09-10 DIAGNOSIS — J449 Chronic obstructive pulmonary disease, unspecified: Secondary | ICD-10-CM | POA: Diagnosis not present

## 2023-09-10 DIAGNOSIS — L89623 Pressure ulcer of left heel, stage 3: Secondary | ICD-10-CM | POA: Diagnosis not present

## 2023-09-10 DIAGNOSIS — N1832 Chronic kidney disease, stage 3b: Secondary | ICD-10-CM | POA: Diagnosis not present

## 2023-09-10 DIAGNOSIS — L97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 without evidence of necrosis: Secondary | ICD-10-CM | POA: Insufficient documentation

## 2023-09-10 DIAGNOSIS — I4891 Unspecified atrial fibrillation: Secondary | ICD-10-CM | POA: Insufficient documentation

## 2023-09-10 DIAGNOSIS — D649 Anemia, unspecified: Secondary | ICD-10-CM | POA: Diagnosis not present

## 2023-09-10 DIAGNOSIS — I872 Venous insufficiency (chronic) (peripheral): Secondary | ICD-10-CM | POA: Diagnosis not present

## 2023-09-10 DIAGNOSIS — I739 Peripheral vascular disease, unspecified: Secondary | ICD-10-CM | POA: Insufficient documentation

## 2023-09-10 DIAGNOSIS — I5032 Chronic diastolic (congestive) heart failure: Secondary | ICD-10-CM | POA: Diagnosis not present

## 2023-09-10 DIAGNOSIS — L97822 Non-pressure chronic ulcer of other part of left lower leg with fat layer exposed: Secondary | ICD-10-CM | POA: Insufficient documentation

## 2023-09-10 DIAGNOSIS — I130 Hypertensive heart and chronic kidney disease with heart failure and stage 1 through stage 4 chronic kidney disease, or unspecified chronic kidney disease: Secondary | ICD-10-CM | POA: Insufficient documentation

## 2023-09-11 NOTE — Progress Notes (Signed)
 TANASHA, MENEES ([id number]) [id number] Page 1 of 14 Visit Report for 09/10/2023 Arrival Information Details Patient Name: Date of Service: [name], [name] 09/10/2023 2:45 PM Medical Record Number: [id number] Patient Account Number: [id number] Date of Birth/Sex: Treating RN: 02-04-1928 (89 y.o. [name] Primary Care Mera Gunkel: Dyane Dries Other Clinician: Referring Porfiria Heinrich: Treating Masiyah Jorstad/Extender: Marolyn Delon Dyane Dries Weeks in Treatment: 26 Visit Information History Since Last Visit Added or deleted any medications: No Patient Arrived: Wheel Chair Any new allergies or adverse reactions: No Arrival Time: 15:01 Had a fall or experienced change in No Accompanied By: daughter activities of daily living that may affect Transfer Assistance: Manual risk of falls: Patient Identification Verified: Yes Signs or symptoms of abuse/neglect since last visito No Patient Requires Transmission-Based Precautions: No Implantable device outside of the clinic excluding No Patient Has Alerts: Yes cellular tissue based products placed in the center Patient Alerts: ABI R: 0.48 L: 0.46 2/24 since last visit: TBI R: 0.39 L: 0.21 2/24 Has Dressing in Place as Prescribed: Yes Pain Present Now: No Electronic Signature(s) Signed: 09/10/2023 4:44:55 PM By: Claven Pollen RN Entered By: Claven [name] on 09/10/2023 15:06:34 -------------------------------------------------------------------------------- Encounter Discharge Information Details Patient Name: Date of Service: [name]. 09/10/2023 2:45 PM Medical Record Number: [id number] Patient Account Number: [id number] Date of Birth/Sex: Treating RN: Nov 27, 1927 (89 y.o. [name] Primary Care Victorio Creeden: Dyane Dries Other Clinician: Referring Louan Base: Treating Javaun Dimperio/Extender: Marolyn Delon Dyane Dries Weeks in Treatment: 26 Encounter Discharge Information Items Post Procedure  Vitals Discharge Condition: Stable Temperature (F): 97.5 Ambulatory Status: Wheelchair Pulse (bpm): 67 Discharge Destination: Home Respiratory Rate (breaths/min): 20 Transportation: Private Auto Blood Pressure (mmHg): 186/65 Accompanied By: daughter Schedule Follow-up Appointment: Yes Clinical Summary of Care: Patient Declined Electronic Signature(s) Signed: 09/10/2023 4:44:55 PM By: Claven Pollen RN Entered By: Claven [name] on 09/10/2023 16:42:01 Leveque, JOHNSIE [name] ([id number]) [id number] Page 2 of 14 -------------------------------------------------------------------------------- Lower Extremity Assessment Details Patient Name: Date of Service: [name], [name] 09/10/2023 2:45 PM Medical Record Number: [id number] Patient Account Number: [id number] Date of Birth/Sex: Treating RN: [birth year] (89 y.o. [name] Primary Care Yovani Cogburn: Dyane Dries Other Clinician: Referring Kalayah Leske: Treating Jj Enyeart/Extender: Marolyn Delon Dyane Dries Weeks in Treatment: 26 Edema Assessment Assessed: [Left: No] [Right: No] Edema: [Left: Yes] [Right: Yes] Calf Left: Right: Point of Measurement: From Medial Instep 37 cm 35.9 cm Ankle Left: Right: Point of Measurement: From Medial Instep 23 cm 22.8 cm Vascular Assessment Pulses: Dorsalis Pedis Palpable: [Left:Yes] [Right:Yes] Extremity colors, hair growth, and conditions: Extremity Color: [Left:Hyperpigmented] [Right:Hyperpigmented] Hair Growth on Extremity: [Left:No] [Right:No] Temperature of Extremity: [Left:Warm < 3 seconds] [Right:Warm < 3 seconds] Electronic Signature(s) Signed: 09/10/2023 4:44:55 PM By: Claven Pollen RN Entered By: Claven [name] on 09/10/2023 15:23:38 -------------------------------------------------------------------------------- Multi Wound Chart Details Patient Name: Date of Service: [name]. 09/10/2023 2:45 PM Medical Record Number: [id number] Patient Account  Number: [id number] Date of Birth/Sex: Treating RN: 1928-02-18 (89 y.o. F) Primary Care Melisse Caetano: Dyane Dries Other Clinician: Referring [name]: Treating Joliene Salvador/Extender: Marolyn Delon Dyane Dries Weeks in Treatment: 26 Vital Signs Height(in): 64 Pulse(bpm): 67 Weight(lbs): 157 Blood Pressure(mmHg): 186/65 Body Mass Index(BMI): 26.9 Temperature(F): 97.5 Respiratory Rate(breaths/min): 20 [10:Photos:] [phone].pdf Page 3 of 14] Left T Second oe Right T Second oe Left, Dorsal T Great oe Wound Location: Pressure Injury Pressure Injury Gradually Appeared Wounding Event: Pressure Ulcer Pressure Ulcer Arterial Insufficiency Ulcer Primary Etiology: Cataracts, Anemia, Arrhythmia, Cataracts, Anemia, Arrhythmia, Cataracts, Anemia, Arrhythmia, Comorbid History: Congestive Heart Failure, Congestive Heart  Failure, Congestive Heart Failure, Hypotension, Peripheral Venous Hypotension, Peripheral Venous Hypotension, Peripheral Venous Disease, Osteomyelitis, Neuropathy Disease, Osteomyelitis, Neuropathy Disease, Osteomyelitis, Neuropathy 10/09/2022 10/09/2022 03/24/2023 Date Acquired: 26 26 24  Weeks of Treatment: Open Open Open Wound Status: No No No Wound Recurrence: 0.5x0.6x0.3 0.4x0.7x0.3 0.9x1.6x0.4 Measurements L x W x D (cm) 0.236 0.22 1.131 A (cm) : rea 0.071 0.066 0.452 Volume (cm) : 66.60% 68.90% -1331.60% % Reduction in Area: 0.00% 7.00% -5550.00% % Reduction in Volume: No No No Undermining: Category/Stage IV Category/Stage IV Full Thickness With Exposed Support Classification: Structures Medium Medium Medium Exudate Amount: Serous Serous Serous Exudate Type: amber amber amber Exudate Color: No No No Foul Odor A Cleansing: fter N/A N/A N/A Odor Anticipated Due to Product Use: Distinct, outline attached Distinct, outline attached Distinct, outline attached Wound Margin: Small (1-33%) Medium (34-66%) Small (1-33%) Granulation  A mount: Red, Pink Red, Pink Red, Pink Granulation Quality: Large (67-100%) Small (1-33%) Large (67-100%) Necrotic Amount: Fat Layer (Subcutaneous Tissue): Yes Fat Layer (Subcutaneous Tissue): Yes Fat Layer (Subcutaneous Tissue): Yes Exposed Structures: Joint: Yes Joint: Yes Bone: Yes Bone: Yes Bone: Yes Fascia: No Fascia: No Fascia: No Tendon: No Tendon: No Tendon: No Muscle: No Muscle: No Muscle: No Joint: No Small (1-33%) Small (1-33%) Small (1-33%) Epithelialization: Debridement - Excisional Debridement - Excisional Debridement - Excisional Debridement: Pre-procedure Verification/Time Out 15:25 15:25 15:25 Taken: Lidocaine  4% Topical Solution Lidocaine  4% Topical Solution Lidocaine  4% Topical Solution Pain Control: Subcutaneous, Slough Necrotic/Eschar, Subcutaneous, Subcutaneous, Slough Tissue Debrided: Slough Skin/Subcutaneous Tissue Skin/Subcutaneous Tissue Skin/Subcutaneous Tissue Level: 0.24 0.22 1.13 Debridement A (sq cm): rea Curette Curette Curette Instrument: Minimum Minimum Minimum Bleeding: Pressure Pressure Pressure Hemostasis A chieved: Procedure was tolerated well Procedure was tolerated well Procedure was tolerated well Debridement Treatment Response: 0.5x0.6x0.3 0.4x0.7x0.3 0.9x1.6x0.4 Post Debridement Measurements L x W x D (cm) 0.071 0.066 0.452 Post Debridement Volume: (cm) Category/Stage IV Category/Stage IV N/A Post Debridement Stage: No Abnormalities Noted No Abnormalities Noted No Abnormalities Noted Periwound Skin Texture: Dry/Scaly: Yes Dry/Scaly: Yes No Abnormalities Noted Periwound Skin Moisture: No Abnormalities Noted Rubor: No Erythema: No Periwound Skin Color: No Abnormality No Abnormality No Abnormality Temperature: Yes Yes Yes Tenderness on Palpation: Debridement Debridement Debridement Procedures Performed: Wound Number: 16 9 N/A Photos: N/A Left Knee Left Calcaneus N/A Wound Location: Trauma Pressure Injury  N/A Wounding Event: Abrasion Pressure Ulcer N/A Primary Etiology: Cataracts, Anemia, Arrhythmia, Cataracts, Anemia, Arrhythmia, N/A Comorbid History: Congestive Heart Failure, Congestive Heart Failure, Hypotension, Peripheral Venous Hypotension, Peripheral Venous Disease, Osteomyelitis, Neuropathy Disease, Osteomyelitis, Neuropathy 06/18/2023 10/22/2022 N/A Date Acquired: 11 26 N/A Weeks of Treatment: Healed - Epithelialized Open N/A Wound Status: [name], [name] ([id number]) [id number] Page 4 of 14 No No N/A Wound Recurrence: 0x0x0 1.6x0.3x0.5 N/A Measurements L x W x D (cm) 0 0.377 N/A A (cm) : rea 0 0.188 N/A Volume (cm) : 100.00% -14.20% N/A % Reduction in A rea: 100.00% -89.90% N/A % Reduction in Volume: 12 Starting Position 1 (o'clock): 12 Ending Position 1 (o'clock): 0.4 Maximum Distance 1 (cm): No Yes N/A Undermining: Full Thickness Without Exposed Category/Stage III N/A Classification: Support Structures None Present Small N/A Exudate Amount: N/A Serous N/A Exudate Type: N/A amber N/A Exudate Color: Yes No N/A Foul Odor A Cleansing: fter No N/A N/A Odor Anticipated Due to Product Use: Distinct, outline attached Distinct, outline attached N/A Wound Margin: None Present (0%) Medium (34-66%) N/A Granulation A mount: N/A Red N/A Granulation Quality: None Present (0%) Medium (34-66%) N/A Necrotic Amount: Fascia: No Fat Layer (  Subcutaneous Tissue): Yes N/A Exposed Structures: Fat Layer (Subcutaneous Tissue): No Fascia: No Tendon: No Tendon: No Muscle: No Muscle: No Joint: No Joint: No Bone: No Bone: No Large (67-100%) Small (1-33%) N/A Epithelialization: N/A Debridement - Excisional N/A Debridement: Pre-procedure Verification/Time Out N/A 15:25 N/A Taken: N/A Lidocaine  4% Topical Solution N/A Pain Control: N/A Subcutaneous, Slough N/A Tissue Debrided: N/A Skin/Subcutaneous Tissue N/A Level: N/A 0.38  N/A Debridement A (sq cm): rea N/A Curette N/A Instrument: N/A Minimum N/A Bleeding: N/A Pressure N/A Hemostasis A chieved: N/A Procedure was tolerated well N/A Debridement Treatment Response: N/A 1.6x0.3x0.5 N/A Post Debridement Measurements L x W x D (cm) N/A 0.188 N/A Post Debridement Volume: (cm) N/A Category/Stage III N/A Post Debridement Stage: No Abnormalities Noted Callus: Yes N/A Periwound Skin Texture: No Abnormalities Noted Dry/Scaly: Yes N/A Periwound Skin Moisture: Ecchymosis: No No Abnormalities Noted N/A Periwound Skin Color: No Abnormality No Abnormality N/A Temperature: Yes Yes N/A Tenderness on Palpation: N/A Debridement N/A Procedures Performed: Treatment Notes Electronic Signature(s) Signed: 09/10/2023 3:42:58 PM By: Marolyn Nest MD FACS Entered By: Marolyn Nest on 09/10/2023 15:42:58 -------------------------------------------------------------------------------- Multi-Disciplinary Care Plan Details Patient Name: Date of Service: Megan TWANA LATISHA [name]. 09/10/2023 2:45 PM Medical Record Number: [id number] Patient Account Number: [id number] Date of Birth/Sex: Treating RN: 08/15/1928 (89 y.o. [name] Primary Care Paydon Carll: Dyane Dries Other Clinician: Referring Shakeia Krus: Treating Aaidyn San/Extender: Marolyn Nest Dyane Dries Weeks in Treatment: 26 Multidisciplinary Care Plan reviewed with physician [address] MADINAH, QUARRY ([id number]) [id number] Page 5 of 14 Abuse / Safety / Falls / Self Care Management Nursing Diagnoses: Impaired home maintenance Impaired physical mobility Potential for falls Goals: Patient/caregiver will verbalize/demonstrate measure taken to improve self care Date Initiated: 03/10/2023 Target Resolution Date: 11/06/2023 Goal Status: Active Patient/caregiver will verbalize/demonstrate measures taken to improve the patient's personal safety Date Initiated: 03/10/2023 Target  Resolution Date: 11/06/2023 Goal Status: Active Interventions: Assess fall risk on admission and as needed Assess: immobility, friction, shearing, incontinence upon admission and as needed Provide education on fall prevention Notes: pt fell 06/18/23 injuring left knee Wound/Skin Impairment Nursing Diagnoses: Impaired tissue integrity Knowledge deficit related to ulceration/compromised skin integrity Goals: Patient/caregiver will verbalize understanding of skin care regimen Date Initiated: 03/10/2023 Target Resolution Date: 11/06/2023 Goal Status: Active Interventions: Assess patient/caregiver ability to obtain necessary supplies Assess patient/caregiver ability to perform ulcer/skin care regimen upon admission and as needed Assess ulceration(s) every visit Treatment Activities: Skin care regimen initiated : 03/10/2023 Topical wound management initiated : 03/10/2023 Notes: Electronic Signature(s) Signed: 09/10/2023 4:44:55 PM By: Claven Pollen RN Entered By: Claven [name] on 09/10/2023 16:39:58 -------------------------------------------------------------------------------- Pain Assessment Details Patient Name: Date of Service: Megan TWANA LATISHA [name]. 09/10/2023 2:45 PM Medical Record Number: [id number] Patient Account Number: [id number] Date of Birth/Sex: Treating RN: 1928/07/28 (89 y.o. [name] Primary Care Lilie Vezina: Dyane Dries Other Clinician: Referring Durante Violett: Treating Dvaughn Fickle/Extender: Marolyn Nest Dyane Dries Weeks in Treatment: 26 Active Problems Location of Pain Severity and Description of Pain Patient Has Paino No Site Locations [place] ([id number]) [id number] Page 6 of 14 Pain Management and Medication Current Pain Management: Electronic Signature(s) Signed: 09/10/2023 4:44:55 PM By: Claven Pollen RN Entered By: Claven [name] on 09/10/2023  15:08:22 -------------------------------------------------------------------------------- Patient/Caregiver Education Details Patient Name: Date of Service: Megan TWANA LATISHA [place] 1/2/2025andnbsp2:45 PM Medical Record Number: [id number] Patient Account Number: [id number] Date of Birth/Gender: Treating RN: 1927/11/23 (89 y.o. [name] Primary Care Physician: Dyane Dries Other Clinician: Referring Physician: Treating Physician/Extender: Marolyn Nest Dyane Dries Weeks in Treatment:  26 Education Assessment Education Provided To: Patient Education Topics Provided Wound/Skin Impairment: Methods: Explain/Verbal Responses: State content correctly Electronic Signature(s) Signed: 09/10/2023 4:44:55 PM By: Claven Pollen RN Entered By: Claven [name] on 09/10/2023 16:40:52 -------------------------------------------------------------------------------- Wound Assessment Details Patient Name: Date of Service: Megan TWANA LATISHA [name]. 09/10/2023 2:45 PM Medical Record Number: [id number] Patient Account Number: [id number] Date of Birth/Sex: Treating RN: 1928/06/19 (89 y.o. [name] Primary Care Kaian Fahs: Dyane Dries Other Clinician: Referring Breyana Follansbee: Treating Tanae Petrosky/Extender: Marolyn Delon Dyane Dries [place], MAHIRA [place] ([id number]) [id number] Page 7 of 14 Weeks in Treatment: 26 Wound Status Wound Number: 10 Primary Pressure Ulcer Etiology: Wound Location: Left T Second oe Wound Open Wounding Event: Pressure Injury Status: Date Acquired: 10/09/2022 Comorbid Cataracts, Anemia, Arrhythmia, Congestive Heart Failure, Weeks Of Treatment: 26 History: Hypotension, Peripheral Venous Disease, Osteomyelitis, Clustered Wound: No Neuropathy Photos Wound Measurements Length: (cm) 0.5 Width: (cm) 0.6 Depth: (cm) 0.3 Area: (cm) 0.236 Volume: (cm) 0.071 % Reduction in Area: 66.6% % Reduction in Volume: 0% Epithelialization: Small (1-33%) Tunneling:  No Undermining: No Wound Description Classification: Category/Stage IV Wound Margin: Distinct, outline attached Exudate Amount: Medium Exudate Type: Serous Exudate Color: amber Foul Odor After Cleansing: No Slough/Fibrino No Wound Bed Granulation Amount: Small (1-33%) Exposed Structure Granulation Quality: Red, Pink Fascia Exposed: No Necrotic Amount: Large (67-100%) Fat Layer (Subcutaneous Tissue) Exposed: Yes Necrotic Quality: Adherent Slough Tendon Exposed: No Muscle Exposed: No Joint Exposed: Yes Bone Exposed: Yes Periwound Skin Texture Texture Color No Abnormalities Noted: Yes No Abnormalities Noted: Yes Moisture Temperature / Pain No Abnormalities Noted: Yes Temperature: No Abnormality Tenderness on Palpation: Yes Treatment Notes Wound #10 (Toe Second) Wound Laterality: Left Cleanser Soap and Water Discharge Instruction: May shower and wash wound with dial antibacterial soap and water prior to dressing change. Wound Cleanser Discharge Instruction: Cleanse the wound with wound cleanser prior to applying a clean dressing using gauze sponges, not tissue or cotton balls. Peri-Wound Care Sween Lotion (Moisturizing lotion) Discharge Instruction: Apply moisturizing lotion as directed Topical Primary Dressing Promogran Prisma Matrix, 4.34 (sq in) (silver collagen) Heuring, Bettey [name] ([id number]) [id number] Page 8 of 14 Discharge Instruction: Moisten collagen with saline or hydrogel Secondary Dressing Woven Gauze Sponge, Non-Sterile 4x4 in Discharge Instruction: Apply over primary dressing as directed. Secured With [employer], 4.5x3.1 (in/yd) Discharge Instruction: Secure with Kerlix as directed. 47M Medipore H Soft Cloth Surgical T ape, 4 x 10 (in/yd) Discharge Instruction: Secure with tape as directed. Compression Wrap Compression Stockings Add-Ons Electronic Signature(s) Signed: 09/10/2023 4:01:41 PM By: Okey Bonier Signed: 09/10/2023  4:44:55 PM By: Claven Pollen RN Entered By: Okey Bonier on 09/10/2023 15:14:45 -------------------------------------------------------------------------------- Wound Assessment Details Patient Name: Date of Service: Megan TWANA LATISHA [name]. 09/10/2023 2:45 PM Medical Record Number: [id number] Patient Account Number: [id number] Date of Birth/Sex: Treating RN: 1928-04-05 (89 y.o. [name] Primary Care Grace Haggart: Dyane Dries Other Clinician: Referring Tesslyn Baumert: Treating Julez Huseby/Extender: Marolyn Delon Dyane Dries Weeks in Treatment: 26 Wound Status Wound Number: 11 Primary Pressure Ulcer Etiology: Wound Location: Right T Second oe Wound Open Wounding Event: Pressure Injury Status: Date Acquired: 10/09/2022 Comorbid Cataracts, Anemia, Arrhythmia, Congestive Heart Failure, Weeks Of Treatment: 26 History: Hypotension, Peripheral Venous Disease, Osteomyelitis, Clustered Wound: No Neuropathy Photos Wound Measurements Length: (cm) 0.4 Width: (cm) 0.7 Depth: (cm) 0.3 Area: (cm) 0.22 Volume: (cm) 0.066 % Reduction in Area: 68.9% % Reduction in Volume: 7% Epithelialization: Small (1-33%) Tunneling: No Undermining: No Wound Description Classification: Category/Stage IV Wound Margin: Distinct, outline attached Exudate Amount: Medium Exudate Type: Serous  [place], [name] ([id number]) Exudate Color: amber Foul Odor After Cleansing: No Slough/Fibrino Yes [id number] Page 9 of 14 Wound Bed Granulation Amount: Medium (34-66%) Exposed Structure Granulation Quality: Red, Pink Fascia Exposed: No Necrotic Amount: Small (1-33%) Fat Layer (Subcutaneous Tissue) Exposed: Yes Necrotic Quality: Adherent Slough Tendon Exposed: No Muscle Exposed: No Joint Exposed: Yes Bone Exposed: Yes Periwound Skin Texture Texture Color No Abnormalities Noted: Yes No Abnormalities Noted: Yes Moisture Temperature / Pain No Abnormalities Noted: Yes Temperature: No  Abnormality Tenderness on Palpation: Yes Treatment Notes Wound #11 (Toe Second) Wound Laterality: Right Cleanser Soap and Water Discharge Instruction: May shower and wash wound with dial antibacterial soap and water prior to dressing change. Wound Cleanser Discharge Instruction: Cleanse the wound with wound cleanser prior to applying a clean dressing using gauze sponges, not tissue or cotton balls. Peri-Wound Care Topical Primary Dressing Promogran Prisma Matrix, 4.34 (sq in) (silver collagen) Discharge Instruction: Moisten collagen with saline or hydrogel Secondary Dressing Woven Gauze Sponge, Non-Sterile 4x4 in Discharge Instruction: Apply over primary dressing as directed. Secured With [employer] Stretch Gauze Bandage, Sterile 2x75 (in/in) Discharge Instruction: Secure with stretch gauze as directed. 52M Medipore H Soft Cloth Surgical T ape, 4 x 10 (in/yd) Discharge Instruction: Secure with tape as directed. Compression Wrap Compression Stockings Add-Ons Electronic Signature(s) Signed: 09/10/2023 4:01:41 PM By: Okey Bonier Signed: 09/10/2023 4:44:55 PM By: Claven Pollen RN Entered By: Okey Bonier on 09/10/2023 15:12:55 -------------------------------------------------------------------------------- Wound Assessment Details Patient Name: Date of Service: Megan TWANA LATISHA [name]. 09/10/2023 2:45 PM Medical Record Number: [id number] Patient Account Number: [id number] Date of Birth/Sex: Treating RN: 1927-10-24 (89 y.o. [name] Primary Care Lavergne Hiltunen: Dyane Dries Other Clinician: Referring Helana Macbride: Treating Alfonse Garringer/Extender: Marolyn Delon Dyane Dries Weeks in Treatment: [address] [place] ([id number]) [id number] Page 10 of 14 Wound Status Wound Number: 13 Primary Arterial Insufficiency Ulcer Etiology: Wound Location: Left, Dorsal T Great oe Wound Open Wounding Event: Gradually Appeared Status: Date Acquired: 03/24/2023 Comorbid Cataracts,  Anemia, Arrhythmia, Congestive Heart Failure, Weeks Of Treatment: 24 History: Hypotension, Peripheral Venous Disease, Osteomyelitis, Clustered Wound: No Neuropathy Photos Wound Measurements Length: (cm) 0.9 Width: (cm) 1.6 Depth: (cm) 0.4 Area: (cm) 1.131 Volume: (cm) 0.452 % Reduction in Area: -1331.6% % Reduction in Volume: -5550% Epithelialization: Small (1-33%) Tunneling: No Undermining: No Wound Description Classification: Full Thickness With Exposed Supp Wound Margin: Distinct, outline attached Exudate Amount: Medium Exudate Type: Serous Exudate Color: amber ort Structures Foul Odor After Cleansing: No Slough/Fibrino Yes Wound Bed Granulation Amount: Small (1-33%) Exposed Structure Granulation Quality: Red, Pink Fascia Exposed: No Necrotic Amount: Large (67-100%) Fat Layer (Subcutaneous Tissue) Exposed: Yes Necrotic Quality: Adherent Slough, Bone Tendon Exposed: No Muscle Exposed: No Joint Exposed: No Bone Exposed: Yes Periwound Skin Texture Texture Color No Abnormalities Noted: Yes No Abnormalities Noted: Yes Moisture Temperature / Pain No Abnormalities Noted: Yes Temperature: No Abnormality Tenderness on Palpation: Yes Treatment Notes Wound #13 (Toe Great) Wound Laterality: Dorsal, Left Cleanser Soap and Water Discharge Instruction: May shower and wash wound with dial antibacterial soap and water prior to dressing change. Wound Cleanser Discharge Instruction: Cleanse the wound with wound cleanser prior to applying a clean dressing using gauze sponges, not tissue or cotton balls. Peri-Wound Care Topical Primary Dressing Promogran Prisma Matrix, 4.34 (sq in) (silver collagen) Discharge Instruction: Moisten collagen with saline or hydrogel Secondary Dressing Woven Gauze Sponge, Non-Sterile 4x4 in Discharge Instruction: Apply over primary dressing as directed. DASANI, THURLOW ([id number]) [id number] Page 11 of 14 Secured With 52M  Medipore H  Soft Cloth Surgical T ape, 4 x 10 (in/yd) Discharge Instruction: Secure with tape as directed. Compression Wrap Compression Stockings Add-Ons Electronic Signature(s) Signed: 09/10/2023 4:01:41 PM By: Okey Bonier Signed: 09/10/2023 4:44:55 PM By: Claven Pollen RN Entered By: Okey Bonier on 09/10/2023 15:17:03 -------------------------------------------------------------------------------- Wound Assessment Details Patient Name: Date of Service: Megan TWANA LATISHA [name]. 09/10/2023 2:45 PM Medical Record Number: [id number] Patient Account Number: [id number] Date of Birth/Sex: Treating RN: [birth year] (89 y.o. [name] Primary Care Diandre Merica: Dyane Dries Other Clinician: Referring Latrelle Bazar: Treating Jule Whitsel/Extender: Marolyn Delon Dyane Dries Weeks in Treatment: 26 Wound Status Wound Number: 16 Primary Abrasion Etiology: Wound Location: Left Knee Wound Healed - Epithelialized Wounding Event: Trauma Status: Date Acquired: 06/18/2023 Comorbid Cataracts, Anemia, Arrhythmia, Congestive Heart Failure, Weeks Of Treatment: 11 History: Hypotension, Peripheral Venous Disease, Osteomyelitis, Clustered Wound: No Neuropathy Photos Wound Measurements Length: (cm) Width: (cm) Depth: (cm) Area: (cm) Volume: (cm) 0 % Reduction in Area: 100% 0 % Reduction in Volume: 100% 0 Epithelialization: Large (67-100%) 0 Tunneling: No 0 Undermining: No Wound Description Classification: Full Thickness Without Exposed Suppo Wound Margin: Distinct, outline attached Exudate Amount: None Present rt Structures Foul Odor After Cleansing: Yes Due to Product Use: No Slough/Fibrino Yes Wound Bed Granulation Amount: None Present (0%) Exposed Structure Necrotic Amount: None Present (0%) Fascia Exposed: No Fat Layer (Subcutaneous Tissue) Exposed: No Tendon Exposed: No Muscle Exposed: No Faul, [name] ([id number]) [id number] Page 12 of 14 Joint Exposed: No Bone  Exposed: No Periwound Skin Texture Texture Color No Abnormalities Noted: Yes No Abnormalities Noted: Yes Moisture Temperature / Pain No Abnormalities Noted: Yes Temperature: No Abnormality Tenderness on Palpation: Yes Electronic Signature(s) Signed: 09/10/2023 4:44:55 PM By: Claven Pollen RN Entered By: Claven [name] on 09/10/2023 15:32:34 -------------------------------------------------------------------------------- Wound Assessment Details Patient Name: Date of Service: Megan TWANA LATISHA [name]. 09/10/2023 2:45 PM Medical Record Number: [id number] Patient Account Number: [id number] Date of Birth/Sex: Treating RN: [birth year] (89 y.o. [name] Primary Care Kamonte Mcmichen: Dyane Dries Other Clinician: Referring Lilu Mcglown: Treating Endy Easterly/Extender: Marolyn Delon Dyane Dries Weeks in Treatment: 26 Wound Status Wound Number: 9 Primary Pressure Ulcer Etiology: Wound Location: Left Calcaneus Wound Open Wounding Event: Pressure Injury Status: Date Acquired: 10/22/2022 Comorbid Cataracts, Anemia, Arrhythmia, Congestive Heart Failure, Weeks Of Treatment: 26 History: Hypotension, Peripheral Venous Disease, Osteomyelitis, Clustered Wound: No Neuropathy Photos Wound Measurements Length: (cm) 1.6 Width: (cm) 0.3 Depth: (cm) 0.5 Area: (cm) 0.377 Volume: (cm) 0.188 % Reduction in Area: -14.2% % Reduction in Volume: -89.9% Epithelialization: Small (1-33%) Tunneling: No Undermining: Yes Starting Position (o'clock): 12 Ending Position (o'clock): 12 Maximum Distance: (cm) 0.4 Wound Description Classification: Category/Stage III Wound Margin: Distinct, outline attached Exudate Amount: Small Exudate Type: Serous Exudate Color: amber Foul Odor After Cleansing: No Slough/Fibrino Yes Wound Bed Saad, Anamae [name] ([id number]) [id number] Page 13 of 14 Granulation Amount: Medium (34-66%) Exposed Structure Granulation Quality: Red Fascia Exposed: No Necrotic  Amount: Medium (34-66%) Fat Layer (Subcutaneous Tissue) Exposed: Yes Necrotic Quality: Adherent Slough Tendon Exposed: No Muscle Exposed: No Joint Exposed: No Bone Exposed: No Periwound Skin Texture Texture Color No Abnormalities Noted: No No Abnormalities Noted: Yes Callus: Yes Temperature / Pain Temperature: No Abnormality Moisture No Abnormalities Noted: Yes Tenderness on Palpation: Yes Treatment Notes Wound #9 (Calcaneus) Wound Laterality: Left Cleanser Soap and Water Discharge Instruction: May shower and wash wound with dial antibacterial soap and water prior to dressing change. Wound Cleanser Discharge Instruction: Cleanse the wound with wound cleanser prior to applying a clean dressing using gauze sponges, not tissue  or cotton balls. Peri-Wound Care Topical Primary Dressing Promogran Prisma Matrix, 4.34 (sq in) (silver collagen) Discharge Instruction: Moisten collagen with saline or hydrogel Secondary Dressing ALLEVYN Heel 4 1/2in x 5 1/2in / 10.5cm x 13.5cm Discharge Instruction: Apply over primary dressing as directed. Woven Gauze Sponge, Non-Sterile 4x4 in Discharge Instruction: Apply over primary dressing as directed. Secured With [employer], 4.5x3.1 (in/yd) Discharge Instruction: Secure with Kerlix as directed. 32M Medipore H Soft Cloth Surgical T ape, 4 x 10 (in/yd) Discharge Instruction: Secure with tape as directed. Compression Wrap Compression Stockings Add-Ons Electronic Signature(s) Signed: 09/10/2023 4:01:41 PM By: Okey Bonier Signed: 09/10/2023 4:44:55 PM By: Claven Pollen RN Entered By: Okey Bonier on 09/10/2023 15:21:28 -------------------------------------------------------------------------------- Vitals Details Patient Name: Date of Service: Megan TWANA LATISHA [name]. 09/10/2023 2:45 PM Medical Record Number: [id number] Patient Account Number: [id number] Date of Birth/Sex: Treating RN: 31-Aug-1928 (89 y.o. [name] Primary Care Iya Hamed:  Dyane Dries Other Clinician: Referring Kimia Finan: Treating Lonie Rummell/Extender: Marolyn Delon Dyane Dries Weeks in Treatment: [address] [place] [name] ([id number]) [id number] Page 14 of 14 Time Taken: 15:04 Temperature (F): 97.5 Height (in): 64 Pulse (bpm): 67 Weight (lbs): 157 Respiratory Rate (breaths/min): 20 Body Mass Index (BMI): 26.9 Blood Pressure (mmHg): 186/65 Reference Range: 80 - 120 mg / dl Electronic Signature(s) Signed: 09/10/2023 4:44:55 PM By: Claven Pollen RN Entered By: Claven [name] on 09/10/2023 15:08:13

## 2023-09-11 NOTE — Progress Notes (Signed)
 [name], [name] ([id number]) [id number] Page 1 of 13 Visit Report for 09/10/2023 Chief Complaint Document Details Patient Name: Date of Service: [name], [name] 09/10/2023 2:45 PM Medical Record Number: [id number] Patient Account Number: [id number] Date of Birth/Sex: Treating RN: 1928-07-27 (89 y.o. F) Primary Care Provider: Dyane [name] Other Clinician: Referring Provider: Treating Provider/Extender: [name] [name] [name] in [name]: 26 Information Obtained from: Patient Chief Complaint 01/28/2022; right second toe amputation site dehiscence and bilateral lower extremity wounds. 03/10/2023: pressure ulcers of right heel, ulcers on toes Electronic Signature(s) Signed: 09/10/2023 3:43:12 PM By: [name] Entered By: [name] on 09/10/2023 15:43:11 -------------------------------------------------------------------------------- Debridement Details Patient Name: Date of Service: [name]. 09/10/2023 2:45 PM Medical Record Number: [id number] Patient Account Number: [id number] Date of Birth/Sex: Treating RN: 1928-06-04 (89 y.o. [name] Primary Care Provider: Dyane [name] Other Clinician: Referring Provider: Treating Provider/Extender: [name] [name] [name]: 28 Debridement Performed for Assessment: Wound #13 Left,Dorsal T Great oe Performed By: Physician Megan Delon, MD The following information was scribed by: [name] The information was scribed for: [name] Debridement Type: Debridement Severity of Tissue Pre Debridement: Fat layer exposed Level of Consciousness (Pre-procedure): Awake and Alert Pre-procedure Verification/Time Out Yes - 15:25 Taken: Start Time: 15:25 Pain Control: Lidocaine  4% T opical Solution Percent of Wound Bed Debrided: 100% T Area Debrided (cm): otal 1.13 Tissue and other material debrided: Viable, Non-Viable, Slough, Subcutaneous,  Slough Level: Skin/Subcutaneous Tissue Debridement Description: Excisional Instrument: Curette Bleeding: Minimum Hemostasis Achieved: Pressure Response to [name]: Procedure was tolerated well Level of Consciousness (Post- Awake and Alert procedure): Post Debridement Measurements of Total Wound Length: (cm) 0.9 Width: (cm) 1.6 Depth: (cm) 0.4 Volume: (cm) 0.452 [name], [name] ([id number]) [id number] Page 2 of 13 Character of Wound/Ulcer Post Debridement: Improved Severity of Tissue Post Debridement: Fat layer exposed Post Procedure Diagnosis Same as Pre-procedure Electronic Signature(s) Signed: 09/10/2023 4:10:08 PM By: [name] Signed: 09/10/2023 4:44:55 PM By: Megan Pollen RN Entered By: [name] on 09/10/2023 15:35:24 -------------------------------------------------------------------------------- Debridement Details Patient Name: Date of Service: [name]. 09/10/2023 2:45 PM Medical Record Number: [id number] Patient Account Number: [id number] Date of Birth/Sex: Treating RN: 07-May-1928 (89 y.o. [name] Primary Care Provider: Dyane [name] Other Clinician: Referring Provider: Treating Provider/Extender: [name] [name] [name]: 28 Debridement Performed for Assessment: Wound #10 Left T Second oe Performed By: Physician Megan Delon, MD The following information was scribed by: [name] The information was scribed for: [name] Debridement Type: Debridement Level of Consciousness (Pre-procedure): Awake and Alert Pre-procedure Verification/Time Out Yes - 15:25 Taken: Start Time: 15:25 Pain Control: Lidocaine  4% T opical Solution Percent of Wound Bed Debrided: 100% T Area Debrided (cm): otal 0.24 Tissue and other material debrided: Viable, Non-Viable, Slough, Subcutaneous, Slough Level: Skin/Subcutaneous Tissue Debridement Description: Excisional Instrument:  Curette Bleeding: Minimum Hemostasis Achieved: Pressure Response to [name]: Procedure was tolerated well Level of Consciousness (Post- Awake and Alert procedure): Post Debridement Measurements of Total Wound Length: (cm) 0.5 Stage: Category/Stage IV Width: (cm) 0.6 Depth: (cm) 0.3 Volume: (cm) 0.071 Character of Wound/Ulcer Post Debridement: Improved Post Procedure Diagnosis Same as Pre-procedure Electronic Signature(s) Signed: 09/10/2023 4:10:08 PM By: [name] Signed: 09/10/2023 4:44:55 PM By: Megan Pollen RN Entered By: [name] on 09/10/2023 15:36:50 Debridement Details -------------------------------------------------------------------------------- [name] ([id number]) [id number] Page 3 of 13 Patient Name: Date of Service: [name], [name] 09/10/2023 2:45 PM Medical Record  Number: [id number] Patient Account Number: [id number] Date of Birth/Sex: Treating RN: 1928-07-13 (89 y.o. [name] Primary Care Provider: Dyane [name] Other Clinician: Referring Provider: Treating Provider/Extender: [name] [name] [name]: 26 Debridement Performed for Assessment: Wound #9 Left Calcaneus Performed By: Physician Megan Delon, MD Debridement Type: Debridement Level of Consciousness (Pre-procedure): Awake and Alert Pre-procedure Verification/Time Out Yes - 15:25 Taken: Start Time: 15:25 Pain Control: Lidocaine  4% T opical Solution Percent of Wound Bed Debrided: 100% T Area Debrided (cm): otal 0.38 Tissue and other material debrided: Viable, Non-Viable, Slough, Subcutaneous, Skin: Dermis , Skin: Epidermis, Slough Level: Skin/Subcutaneous Tissue Debridement Description: Excisional Instrument: Curette Bleeding: Minimum Hemostasis Achieved: Pressure Response to [name]: Procedure was tolerated well Level of Consciousness (Post- Awake and Alert procedure): Post Debridement Measurements  of Total Wound Length: (cm) 1.6 Stage: Category/Stage III Width: (cm) 0.3 Depth: (cm) 0.5 Volume: (cm) 0.188 Character of Wound/Ulcer Post Debridement: Improved Post Procedure Diagnosis Same as Pre-procedure Electronic Signature(s) Signed: 09/10/2023 4:10:08 PM By: [name] Signed: 09/10/2023 4:44:55 PM By: Megan Pollen RN Entered By: [name] on 09/10/2023 15:38:00 -------------------------------------------------------------------------------- Debridement Details Patient Name: Date of Service: [name]. 09/10/2023 2:45 PM Medical Record Number: [id number] Patient Account Number: [id number] Date of Birth/Sex: Treating RN: [birth year] (89 y.o. [name] Primary Care Provider: Dyane [name] Other Clinician: Referring Provider: Treating Provider/Extender: [name] [name] [name]: 26 Debridement Performed for Assessment: Wound #11 Right T Second oe Performed By: Physician Megan Delon, MD The following information was scribed by: [name] The information was scribed for: [name] Debridement Type: Debridement Level of Consciousness (Pre-procedure): Awake and Alert Pre-procedure Verification/Time Out Yes - 15:25 Taken: Start Time: 15:25 Pain Control: Lidocaine  4% T opical Solution Percent of Wound Bed Debrided: 100% T Area Debrided (cm): otal 0.22 Tissue and other material debrided: Viable, Non-Viable, Eschar, Slough, Subcutaneous, Slough Level: Skin/Subcutaneous Tissue Debridement Description: Excisional Instrument: Curette Bleeding: Minimum Hemostasis Achieved: Pressure [name], [name] ([id number]) [id number] Page 4 of 13 Response to [name]: Procedure was tolerated well Level of Consciousness (Post- Awake and Alert procedure): Post Debridement Measurements of Total Wound Length: (cm) 0.4 Stage: Category/Stage IV Width: (cm) 0.7 Depth: (cm) 0.3 Volume: (cm)  0.066 Character of Wound/Ulcer Post Debridement: Improved Post Procedure Diagnosis Same as Pre-procedure Electronic Signature(s) Signed: 09/10/2023 4:10:08 PM By: [name] Signed: 09/10/2023 4:44:55 PM By: Megan Pollen RN Entered By: [name] on 09/10/2023 15:39:32 -------------------------------------------------------------------------------- HPI Details Patient Name: Date of Service: [name]. 09/10/2023 2:45 PM Medical Record Number: [id number] Patient Account Number: [id number] Date of Birth/Sex: Treating RN: 1928-01-01 (89 y.o. F) Primary Care Provider: Dyane [name] Other Clinician: Referring Provider: Treating Provider/Extender: [name] [name] [name]: 26 History of Present Illness HPI Description: Admission 01/28/2022 Ms. 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 She states she has tried Medihoney and silver alginate in the past to this area with little benefit. She also has 2 small areas limited to skin breakdown to her lower extremities bilaterally. She has chronic venous insufficiency but not has not been wearing her compression stockings. She states she bumped her legs against an object and not so the wound started. She has  been using Medihoney to the sites. She denies signs of infection. 6/2; patient presents for follow-up. She had an x-ray of her right foot done at last clinic visit and this was negative for evidence of osteomyelitis. She also had a wound culture done that showed extra high levels of Staph aureus. I recommended Keystone antibiotics for this and this was ordered. She had ABIs with TBI's done as well that showed monophasic  waveforms to the right foot with TBI of 0 and an ABI of 0.52. Urgent referral was made to vein and vascular and she saw Dr. Melvenia on 6/1, yesterday and he recommended an arteriogram. This is scheduled for 6/16. Patient also reports a new wound to the right great toe. This is a blister that has ruptured. She also reports increased erythema to the toe. 6/6; the patient was worked in urgently today at the insistence of her daughter out of concern for a new wound on the lateral part of the plantar right great toe. She has her original postsurgical wound after the amputation of the right second toe, she has a wound on the medial part of the right great toe. The patient is apparently going for an angiogram by Dr. Melvenia in 2 Megan time. 6/13; patient presents for follow-up. She has been using bacitracin  to the abrasion on the right great toe. She has been using collagen and Keystone antibiotic to the amputation site. She has no issues or complaints today. She denies signs of infection. 6/22; patient presents for follow-up. She states that her abdominal aortogram was canceled due to her renal function. She has been using Keystone antibiotics to the amputation site and Medihoney to the right great toe wound. At the pace of the right great toe she has a slitlike open area that she thinks was caused by the tape from the dressing. 6/29; patient presents for follow-up. She has been using Keystone antibiotics to the amputation site along with collagen. She has been using Medihoney to the right great toe wound. She has no other wounds. She denies signs of infection. 7/13; patient presents for follow-up. She has been using Keystone antibiotics and collagen to the wound sites. She currently denies signs of infection. She states she is scheduled to see me nephrology next month. 7/20; patient presents for follow-up. She continue Keystone antibiotics and collagen to the wound sites. She has no issues or complaints  today. 8/1; patient presents for follow up. She continues to use keystone antibiotics and collagen to the wound sites. She has no issues or complaints today. 8/15; patient presents for follow-up. She has been using Keystone antibiotics and collagen to the wound sites. She followed up with her nephrologist who made medication changes. She is supposed to get a repeat BMP in 2 Megan. Her decrease in renal function was a limiting factor in obtaining an arteriogram for potential intervention for revascularization. She currently denies signs of infection. 9/2; patient presents for follow-up. She has been using Keystone antibiotic and collagen to the wound beds. She has no issues or complaints today. Reports there has been improvement in kidney function however not cleared to have her arteriogram just yet. 10/10; patient presents for follow-up. She has been using Keystone antibiotic and collagen to the wound beds. She reports 2 new wounds 1 to the anterior right Lombardo, Cortnie [name] ([id number]) [id number] Page 5 of 13 lower extremity and another to the plantar aspect of the right foot. She states that the right plantar foot wound was caused by the  home health nurse changing the dressing and causing a skin tear. She is not sure how the right anterior leg wound started. It appears to be from trauma. She denies signs of infection. 10/19; patient presents for follow-up. She has been using Keystone antibiotic and collagen to the right great toe wound. She is been using silver alginate to the right anterior and right plantar foot wound. She has been using Tubigrip to the right lower extremity. The plantar foot wound has healed. She has no issues or complaints today. 11/3; since the patient was last here she was seen in urgent care apparently for an area on the dorsal aspect of the right fifth toe perhaps over the PIP. I saw a picture of this on the daughter's cell phone. There was slough on  this. Urgent care gave him doxycycline . She is also changed the dressing to all wounds back the [facility] and collagen which includes her right leg and left first toe 11/16; patient presents for follow-up. She has a new wound to the left knee. She states she fell. She has been using antibiotic ointment and collagen to this area. She has been using collagen and Keystone antibiotic to the right great toe wound. The anterior right leg wound is healed. She denies signs of infection. 11/30; patient presents for follow-up. The right great toe wound has healed. She has 1 remaining wound to the left knee. She has been using Hydrofera Blue and Medihoney here. 12/19; patient presents for follow-up. Her left knee wound has healed. She has no issues or complaints today. 09/30/2022 Patient's daughter called for an appointment due to increased swelling to her lower extremities bilaterally. Today patient presents with increased swelling to her lower extremities although there is no increased redness or warmth. She was evaluated by her nephrologist who increased her diuretics. Per patient and daughter her swelling has gone down to the leg Over the past several days. She does not have any open wounds. She was advised to elevate her legs and not consume excess salt By her PCP and nephrologist. Patient has compression stockings that she has been using sporadically. READMISSION 03/10/2023 Since her last visit to the clinic, the patient has been hospitalized at least twice, in February with COVID pneumonia and CHF exacerbation. She was discharged to a skilled nursing facility and then readmitted in April with fevers. She was noted to have pressure ulcers on her heels and sacrum. Per family declined skilled nursing placement upon discharge and she has apparently been residing at home. She has followed with the vascular surgery clinic regarding peripheral artery disease and due to ulcerations on her feet, she ultimately  underwent arteriography with balloon angioplasties of the superficial femoral artery, popliteal artery and peroneal artery on the left, along with shockwave ultrasound assisted balloon angioplasty of the popliteal artery and distal SFA. Given her multiple significant medical comorbidities, she is not a candidate for open surgery. She is deemed to be maximally vascularized this procedure, which was performed on 04 March 2023. T oday, she has a stage III pressure ulcer on her left heel and wounds on the PIP joints of her right third and left second toes. No sacral wound is present and the ulcer on her left heel has closed. 03/18/2023: She has a new ulcer on her left great toe. It was just noticed yesterday and the etiology is unknown. The fat layer is exposed and there is some slough accumulation. The other wounds are all essentially unchanged in size. There is a  little bit more granulation tissue on the other toe wounds, but bone is still frankly exposed on both sides. The heel ulcer has thick slough accumulation. 03/26/2023: She has a new wound on the tip of her left third toe. It is black. Neither the patient nor her daughter are aware of how it began. The other wounds are stable. 03/31/2023: The culture that I took from her left third toe returned with MRSA. We contacted the patient's daughter to have her stop levofloxacin  and start doxycycline , but she has not yet picked up the new antibiotic. All of her wounds have accumulated slough. Bone remains exposed. She is scheduled to follow- up with vascular surgery tomorrow to evaluate the patency of her revascularization. 04/09/2023: She saw her vascular surgeon and her ABIs are improved; her TBI's could not be checked due to her bandages. She did finally start taking the prescribed doxycycline . All of her wounds look about the same today. 04/23/2023: No significant changes to any of her wounds aside from being slightly smaller other than the left dorsal great  toe wound, which is stable. She has accumulated slough and eschar on all of the surfaces. 04/30/2023: All of the wounds are stable with the exception of the left dorsal great toe wound which now has tendon exposed in addition to bone. There is slough and eschar accumulation on all sites. 05/15/2023: The patient was in the hospital and missed her appointment last week. The wound at the tip of her left third toe is healed. Everything else looks the same. 05/21/2023: No real change to any of the wounds. 05/28/2023: Once again, there has been no real change to any of her wounds except that the wound at the tip of her left great toe is closed. 06/11/2019: No change to any of her wounds. 06/25/2023: The wound on her right great toe nailbed has healed. The other wounds on her toes are unchanged and continue to have bone/joints exposed. The ulcer on her heel is smaller and shallower with very little necrotic tissue present. She fell last week and split her knee open. Apparently she presented to the ED greater than 12 hours after injury and they attempted to loosely suture the site. The sutures have pulled through her skin and they are doing absolutely nothing at this point. The wound is full of nonviable tissue. The muscle fascia is visible. Her PCP has her on doxycycline . 07/08/2023: The wounds on her feet are basically unchanged although the heel ulcer may be slightly smaller. The wound on her left knee looks much better this week. It is cleaner but still has a layer of slough on the surface. I can no longer see the muscle fascia. 07/22/2023: The heel ulcer is smaller and shallower. The left knee wound has also closed up considerably with just a small superficial open area that remains. The wounds on her toes are unchanged with persistent bone exposure. 07/29/2023: The knee wound is smaller again today. There is a little bit of dry skin around the edges and thin slough on the surface. The other wounds  are unchanged. 08/26/2023: The knee wound is nearly closed. It is very superficial with a clean surface. The wound on her heel is also smaller and shallower. The other wounds are stable. 09/10/2023: The wound on her knee is healed. The heel wound is larger and deeper today. The wounds on her toes are unchanged. Electronic Signature(s) Signed: 09/10/2023 3:43:45 PM By: Megan Nest MD [name] Entered By: Megan Nest on 09/10/2023 15:43:45  [name], [name] [name] ([id number]) [id number] Page 6 of 13 -------------------------------------------------------------------------------- Physical Exam Details Patient Name: Date of Service: SHELLSEA, BORUNDA 09/10/2023 2:45 PM Medical Record Number: [id number] Patient Account Number: [id number] Date of Birth/Sex: Treating RN: [birth year] (89 y.o. F) Primary Care Provider: Dyane [name] Other Clinician: Referring Provider: Treating Provider/Extender: [name] [name] [name]: 26 Constitutional Hypertensive, asymptomatic. . . . Chronically ill-appearing, but in no acute distress.SABRA Respiratory Normal work of breathing on supplemental oxygen. Notes 09/10/2023: The wound on her knee is healed. The heel wound is larger and deeper today. The wounds on her toes are unchanged. Electronic Signature(s) Signed: 09/10/2023 3:49:52 PM By: [name] Previous Signature: 09/10/2023 3:44:46 PM Version By: [name] Entered By: [name] on 09/10/2023 15:49:51 -------------------------------------------------------------------------------- Physician Orders Details Patient Name: Date of Service: [name]. 09/10/2023 2:45 PM Medical Record Number: [id number] Patient Account Number: [id number] Date of Birth/Sex: Treating RN: [birth year] (89 y.o. [name] Primary Care Provider: Dyane [name] Other Clinician: Referring Provider: Treating Provider/Extender: [name] [name] [name]: 26 Verbal / Phone Orders: No Diagnosis Coding Follow-up Appointments ppointment in 2 Megan. - Dr. Marolyn Return A 09/23/23 at 2:45pm Anesthetic (In clinic) Topical Lidocaine  4% applied to wound bed Bathing/ Shower/ Hygiene May shower and wash wound with soap and water. Off-Loading Other: - float heels with pillows under calves Additional Orders / Instructions Follow Nutritious Diet - add in protein shakes every day to diet - recommend premier protein 500 mg x3 a day vitamin C , zinc  30-50 mg per day Home Health No change in wound care orders this week; continue Home Health for wound care. May utilize formulary equivalent dressing for wound [name] orders unless otherwise specified. Other Home Health Orders/Instructions: - Medi Home Wound [name] Wound #10 - T Second oe Wound Laterality: Left Cleanser: Soap and Water 1 x Per Day/30 Days Discharge Instructions: May shower and wash wound with dial antibacterial soap and water prior to dressing change. [name], [name] ([id number]) [id number] Page 7 of 13 Cleanser: Wound Cleanser 1 x Per Day/30 Days Discharge Instructions: Cleanse the wound with wound cleanser prior to applying a clean dressing using gauze sponges, not tissue or cotton balls. Peri-Wound Care: Sween Lotion (Moisturizing lotion) 1 x Per Day/30 Days Discharge Instructions: Apply moisturizing lotion as directed Prim Dressing: Promogran Prisma Matrix, 4.34 (sq in) (silver collagen) (Dispense As Written) 1 x Per Day/30 Days ary Discharge Instructions: Moisten collagen with saline or hydrogel Secondary Dressing: Woven Gauze Sponge, Non-Sterile 4x4 in (Generic) 1 x Per Day/30 Days Discharge Instructions: Apply over primary dressing as directed. Secured With: [employer], 4.5x3.1 (in/yd) (Generic) 1 x Per Day/30 Days Discharge Instructions: Secure with Kerlix as directed. Secured With: 53M Medipore H Soft Cloth  Surgical T ape, 4 x 10 (in/yd) (Generic) 1 x Per Day/30 Days Discharge Instructions: Secure with tape as directed. Wound #11 - T Second oe Wound Laterality: Right Cleanser: Soap and Water 1 x Per Day/30 Days Discharge Instructions: May shower and wash wound with dial antibacterial soap and water prior to dressing change. Cleanser: Wound Cleanser 1 x Per Day/30 Days Discharge Instructions: Cleanse the wound with wound cleanser prior to applying a clean dressing using gauze sponges, not tissue or cotton balls. Prim Dressing: Promogran Prisma Matrix, 4.34 (sq in) (silver collagen) (Dispense As Written) 1 x Per Day/30 Days ary Discharge Instructions: Moisten collagen with saline or hydrogel Secondary Dressing: Woven Gauze Sponge, Non-Sterile 4x4 in (  Generic) 1 x Per Day/30 Days Discharge Instructions: Apply over primary dressing as directed. Secured With: Insurance Underwriter, Sterile 2x75 (in/in) (Generic) 1 x Per Day/30 Days Discharge Instructions: Secure with stretch gauze as directed. Secured With: 18M Medipore H Soft Cloth Surgical T ape, 4 x 10 (in/yd) (Generic) 1 x Per Day/30 Days Discharge Instructions: Secure with tape as directed. Wound #13 - T Great oe Wound Laterality: Dorsal, Left Cleanser: Soap and Water 1 x Per Day/30 Days Discharge Instructions: May shower and wash wound with dial antibacterial soap and water prior to dressing change. Cleanser: Wound Cleanser 1 x Per Day/30 Days Discharge Instructions: Cleanse the wound with wound cleanser prior to applying a clean dressing using gauze sponges, not tissue or cotton balls. Prim Dressing: Promogran Prisma Matrix, 4.34 (sq in) (silver collagen) (Dispense As Written) 1 x Per Day/30 Days ary Discharge Instructions: Moisten collagen with saline or hydrogel Secondary Dressing: Woven Gauze Sponge, Non-Sterile 4x4 in (Generic) 1 x Per Day/30 Days Discharge Instructions: Apply over primary dressing as directed. Secured With:  18M Medipore H Soft Cloth Surgical T ape, 4 x 10 (in/yd) (Generic) 1 x Per Day/30 Days Discharge Instructions: Secure with tape as directed. Wound #9 - Calcaneus Wound Laterality: Left Cleanser: Soap and Water 1 x Per Day/30 Days Discharge Instructions: May shower and wash wound with dial antibacterial soap and water prior to dressing change. Cleanser: Wound Cleanser 1 x Per Day/30 Days Discharge Instructions: Cleanse the wound with wound cleanser prior to applying a clean dressing using gauze sponges, not tissue or cotton balls. Prim Dressing: Promogran Prisma Matrix, 4.34 (sq in) (silver collagen) (Dispense As Written) 1 x Per Day/30 Days ary Discharge Instructions: Moisten collagen with saline or hydrogel Secondary Dressing: ALLEVYN Heel 4 1/2in x 5 1/2in / 10.5cm x 13.5cm (Generic) 1 x Per Day/30 Days Discharge Instructions: Apply over primary dressing as directed. Secondary Dressing: Woven Gauze Sponge, Non-Sterile 4x4 in (Generic) 1 x Per Day/30 Days Discharge Instructions: Apply over primary dressing as directed. Secured With: [employer], 4.5x3.1 (in/yd) (Generic) 1 x Per Day/30 Days Discharge Instructions: Secure with Kerlix as directed. Secured With: 18M Medipore H Soft Cloth Surgical T ape, 4 x 10 (in/yd) (Generic) 1 x Per Day/30 Days Discharge Instructions: Secure with tape as directed. [name], [name] ([id number]) [id number] Page 8 of 13 Electronic Signature(s) Signed: 09/10/2023 4:10:08 PM By: Megan Nest MD [name] Entered By: Megan Nest on 09/10/2023 15:50:16 -------------------------------------------------------------------------------- Problem List Details Patient Name: Date of Service: [name]. 09/10/2023 2:45 PM Medical Record Number: [id number] Patient Account Number: [id number] Date of Birth/Sex: Treating RN: September 07, 1928 (89 y.o. 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 03/10/2023 No Yes without evidence of necrosis 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 03/10/2023 No Yes without evidence of necrosis I73.9 Peripheral vascular disease, unspecified 03/10/2023 No Yes I50.32 Chronic diastolic (congestive) heart failure 03/10/2023 No Yes N18.32 Chronic kidney disease, stage 3b 03/10/2023 No Yes I87.2 Venous insufficiency (chronic) (peripheral) 03/10/2023 No Yes Inactive Problems ICD-10 Code Description Active Date Inactive Date L97.522 Non-pressure chronic ulcer of other part of left foot with fat layer exposed 03/18/2023 03/18/2023 Resolved Problems ICD-10 Code Description Active Date  Resolved Date L97.825 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 involvement 06/25/2023 06/25/2023 without evidence of necrosis [name], [name] ([id number]) [id number] Page 9 of 13 Electronic Signature(s) Signed: 09/10/2023 3:41:45 PM By: Megan Nest MD [name] Entered By: Megan Nest on 09/10/2023 15:41:45 -------------------------------------------------------------------------------- Progress Note Details Patient Name: Date of Service: [name]. 09/10/2023 2:45 PM Medical Record Number: [id number] Patient Account Number: [id number] Date of Birth/Sex: Treating RN: 1928-08-11 (89 y.o. F) Primary Care Provider: Dyane [name] Other Clinician: Referring Provider: Treating Provider/Extender: Megan Nest [name] [name] in [name]: 26 Subjective Chief Complaint Information obtained from Patient 01/28/2022; right second toe amputation site dehiscence and bilateral lower  extremity wounds. 03/10/2023: pressure ulcers of right heel, ulcers on toes History of Present Illness (HPI) Admission 01/28/2022 Ms. 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 She states she has tried Medihoney and silver alginate in the past to this area with little benefit. She also has 2 small areas limited to skin breakdown to her lower extremities bilaterally. She has chronic venous insufficiency but not has not been wearing her compression stockings. She states she bumped her legs against an object and not so the wound started. She has been using Medihoney to the sites. She denies signs of infection. 6/2; patient presents for follow-up. She had an x-ray of her right foot done at last clinic visit and this was negative for evidence of osteomyelitis. She also had a wound culture done that showed extra high levels of Staph aureus. I recommended Keystone antibiotics for this and this was ordered. She had ABIs with TBI's done as well that showed monophasic waveforms to the right foot with TBI of 0 and an ABI of 0.52. Urgent referral was made to vein and vascular and she saw Dr. Melvenia on 6/1, yesterday and he recommended an arteriogram. This is scheduled for 6/16. Patient also reports a new wound to the right great toe. This is a blister that has ruptured. She also reports increased erythema to the toe. 6/6; the patient was worked in urgently today at the insistence of her daughter out of concern for a new wound on the lateral part of the plantar right great toe. She has her original postsurgical wound after the amputation of the right second toe, she has a wound on the medial part of the right great toe. The patient is  apparently going for an angiogram by Dr. Melvenia in 2 Megan time. 6/13; patient presents for follow-up. She has been using bacitracin  to the abrasion on the right great toe. She has been using collagen and Keystone antibiotic to the amputation site. She has no issues or complaints today. She denies signs of infection. 6/22; patient presents for follow-up. She states that her abdominal aortogram was canceled due to her renal function. She has been using Keystone antibiotics to the amputation site and Medihoney to the right great toe wound. At the pace of the right great toe she has a slitlike open area that she thinks was caused by the tape from the dressing. 6/29; patient presents for follow-up. She has been using Keystone antibiotics to the amputation site along with collagen. She has been using Medihoney to the right great toe wound.  She has no other wounds. She denies signs of infection. 7/13; patient presents for follow-up. She has been using Keystone antibiotics and collagen to the wound sites. She currently denies signs of infection. She states she is scheduled to see me nephrology next month. 7/20; patient presents for follow-up. She continue Keystone antibiotics and collagen to the wound sites. She has no issues or complaints today. 8/1; patient presents for follow up. She continues to use keystone antibiotics and collagen to the wound sites. She has no issues or complaints today. 8/15; patient presents for follow-up. She has been using Keystone antibiotics and collagen to the wound sites. She followed up with her nephrologist who made medication changes. She is supposed to get a repeat BMP in 2 Megan. Her decrease in renal function was a limiting factor in obtaining an arteriogram for potential intervention for revascularization. She currently denies signs of infection. 9/2; patient presents for follow-up. She has been using Keystone antibiotic and collagen to the wound beds. She has no issues  or complaints today. Reports there has been improvement in kidney function however not cleared to have her arteriogram just yet. 10/10; patient presents for follow-up. She has been using Keystone antibiotic and collagen to the wound beds. She reports 2 new wounds 1 to the anterior right lower extremity and another to the plantar aspect of the right foot. She states that the right plantar foot wound was caused by the home health nurse changing the dressing and causing a skin tear. She is not sure how the right anterior leg wound started. It appears to be from trauma. She denies signs of infection. 10/19; patient presents for follow-up. She has been using Keystone antibiotic and collagen to the right great toe wound. She is been using silver alginate to the right anterior and right plantar foot wound. She has been using Tubigrip to the right lower extremity. The plantar foot wound has healed. She has no issues or complaints today. 11/3; since the patient was last here she was seen in urgent care apparently for an area on the dorsal aspect of the right fifth toe perhaps over the PIP. I saw a picture of this on the daughter's cell phone. There was slough on this. Urgent care gave him doxycycline . She is also changed the dressing to all wounds back the [facility] and collagen which includes her right leg and left first toe 11/16; patient presents for follow-up. She has a new wound to the left knee. She states she fell. She has been using antibiotic ointment and collagen to this TERICKA, DEVINCENZI ([id number]) [id number] Page 10 of 13 area. She has been using collagen and Keystone antibiotic to the right great toe wound. The anterior right leg wound is healed. She denies signs of infection. 11/30; patient presents for follow-up. The right great toe wound has healed. She has 1 remaining wound to the left knee. She has been using Hydrofera Blue and Medihoney here. 12/19; patient  presents for follow-up. Her left knee wound has healed. She has no issues or complaints today. 09/30/2022 Patient's daughter called for an appointment due to increased swelling to her lower extremities bilaterally. Today patient presents with increased swelling to her lower extremities although there is no increased redness or warmth. She was evaluated by her nephrologist who increased her diuretics. Per patient and daughter her swelling has gone down to the leg Over the past several days. She does not have any open wounds. She was advised to elevate her legs and not  consume excess salt By her PCP and nephrologist. Patient has compression stockings that she has been using sporadically. READMISSION 03/10/2023 Since her last visit to the clinic, the patient has been hospitalized at least twice, in February with COVID pneumonia and CHF exacerbation. She was discharged to a skilled nursing facility and then readmitted in April with fevers. She was noted to have pressure ulcers on her heels and sacrum. Per family declined skilled nursing placement upon discharge and she has apparently been residing at home. She has followed with the vascular surgery clinic regarding peripheral artery disease and due to ulcerations on her feet, she ultimately underwent arteriography with balloon angioplasties of the superficial femoral artery, popliteal artery and peroneal artery on the left, along with shockwave ultrasound assisted balloon angioplasty of the popliteal artery and distal SFA. Given her multiple significant medical comorbidities, she is not a candidate for open surgery. She is deemed to be maximally vascularized this procedure, which was performed on 04 March 2023. T oday, she has a stage III pressure ulcer on her left heel and wounds on the PIP joints of her right third and left second toes. No sacral wound is present and the ulcer on her left heel has closed. 03/18/2023: She has a new ulcer on her left great  toe. It was just noticed yesterday and the etiology is unknown. The fat layer is exposed and there is some slough accumulation. The other wounds are all essentially unchanged in size. There is a little bit more granulation tissue on the other toe wounds, but bone is still frankly exposed on both sides. The heel ulcer has thick slough accumulation. 03/26/2023: She has a new wound on the tip of her left third toe. It is black. Neither the patient nor her daughter are aware of how it began. The other wounds are stable. 03/31/2023: The culture that I took from her left third toe returned with MRSA. We contacted the patient's daughter to have her stop levofloxacin  and start doxycycline , but she has not yet picked up the new antibiotic. All of her wounds have accumulated slough. Bone remains exposed. She is scheduled to follow- up with vascular surgery tomorrow to evaluate the patency of her revascularization. 04/09/2023: She saw her vascular surgeon and her ABIs are improved; her TBI's could not be checked due to her bandages. She did finally start taking the prescribed doxycycline . All of her wounds look about the same today. 04/23/2023: No significant changes to any of her wounds aside from being slightly smaller other than the left dorsal great toe wound, which is stable. She has accumulated slough and eschar on all of the surfaces. 04/30/2023: All of the wounds are stable with the exception of the left dorsal great toe wound which now has tendon exposed in addition to bone. There is slough and eschar accumulation on all sites. 05/15/2023: The patient was in the hospital and missed her appointment last week. The wound at the tip of her left third toe is healed. Everything else looks the same. 05/21/2023: No real change to any of the wounds. 05/28/2023: Once again, there has been no real change to any of her wounds except that the wound at the tip of her left great toe is closed. 06/11/2019: No change to any of  her wounds. 06/25/2023: The wound on her right great toe nailbed has healed. The other wounds on her toes are unchanged and continue to have bone/joints exposed. The ulcer on her heel is smaller and shallower with very  little necrotic tissue present. She fell last week and split her knee open. Apparently she presented to the ED greater than 12 hours after injury and they attempted to loosely suture the site. The sutures have pulled through her skin and they are doing absolutely nothing at this point. The wound is full of nonviable tissue. The muscle fascia is visible. Her PCP has her on doxycycline . 07/08/2023: The wounds on her feet are basically unchanged although the heel ulcer may be slightly smaller. The wound on her left knee looks much better this week. It is cleaner but still has a layer of slough on the surface. I can no longer see the muscle fascia. 07/22/2023: The heel ulcer is smaller and shallower. The left knee wound has also closed up considerably with just a small superficial open area that remains. The wounds on her toes are unchanged with persistent bone exposure. 07/29/2023: The knee wound is smaller again today. There is a little bit of dry skin around the edges and thin slough on the surface. The other wounds are unchanged. 08/26/2023: The knee wound is nearly closed. It is very superficial with a clean surface. The wound on her heel is also smaller and shallower. The other wounds are stable. 09/10/2023: The wound on her knee is healed. The heel wound is larger and deeper today. The wounds on her toes are unchanged. Objective Constitutional Hypertensive, asymptomatic. Chronically ill-appearing, but in no acute distress.. Vitals Time Taken: 3:04 PM, Height: 64 in, Weight: 157 lbs, BMI: 26.9, Temperature: 97.5 F, Pulse: 67 bpm, Respiratory Rate: 20 breaths/min, Blood Pressure: 186/65 mmHg. Respiratory Normal work of breathing on supplemental oxygen. [name], [name] ([id number])  [id number] Page 11 of 13 General Notes: 09/10/2023: The wound on her knee is healed. The heel wound is larger and deeper today. The wounds on her toes are unchanged. Integumentary (Hair, Skin) Wound #10 status is Open. Original cause of wound was Pressure Injury. The date acquired was: 10/09/2022. The wound has been in [name] 26 Megan. The wound is located on the Left T Second. The wound measures 0.5cm length x 0.6cm width x 0.3cm depth; 0.236cm^2 area and 0.071cm^3 volume. There is oe bone, joint, and Fat Layer (Subcutaneous Tissue) exposed. There is no tunneling or undermining noted. There is a medium amount of serous drainage noted. The wound margin is distinct with the outline attached to the wound base. There is small (1-33%) red, pink granulation within the wound bed. There is a large (67- 100%) amount of necrotic tissue within the wound bed including Adherent Slough. The periwound skin appearance had no abnormalities noted for texture. The periwound skin appearance had no abnormalities noted for moisture. The periwound skin appearance had no abnormalities noted for color. Periwound temperature was noted as No Abnormality. The periwound has tenderness on palpation. Wound #11 status is Open. Original cause of wound was Pressure Injury. The date acquired was: 10/09/2022. The wound has been in [name] 26 Megan. The wound is located on the Right T Second. The wound measures 0.4cm length x 0.7cm width x 0.3cm depth; 0.22cm^2 area and 0.066cm^3 volume. There is oe bone, joint, and Fat Layer (Subcutaneous Tissue) exposed. There is no tunneling or undermining noted. There is a medium amount of serous drainage noted. The wound margin is distinct with the outline attached to the wound base. There is medium (34-66%) red, pink granulation within the wound bed. There is a small (1-33%) amount of necrotic tissue within the wound bed including Adherent Slough. The periwound  skin  appearance had no abnormalities noted for texture. The periwound skin appearance had no abnormalities noted for moisture. The periwound skin appearance had no abnormalities noted for color. Periwound temperature was noted as No Abnormality. The periwound has tenderness on palpation. Wound #13 status is Open. Original cause of wound was Gradually Appeared. The date acquired was: 03/24/2023. The wound has been in [name] 24 Megan. The wound is located on the Left,Dorsal T Great. The wound measures 0.9cm length x 1.6cm width x 0.4cm depth; 1.131cm^2 area and 0.452cm^3 volume. oe There is bone and Fat Layer (Subcutaneous Tissue) exposed. There is no tunneling or undermining noted. There is a medium amount of serous drainage noted. The wound margin is distinct with the outline attached to the wound base. There is small (1-33%) red, pink granulation within the wound bed. There is a large (67- 100%) amount of necrotic tissue within the wound bed including Adherent Slough and Necrosis of Bone. The periwound skin appearance had no abnormalities noted for texture. The periwound skin appearance had no abnormalities noted for moisture. The periwound skin appearance had no abnormalities noted for color. Periwound temperature was noted as No Abnormality. The periwound has tenderness on palpation. Wound #16 status is Healed - Epithelialized. Original cause of wound was Trauma. The date acquired was: 06/18/2023. The wound has been in [name] 11 Megan. The wound is located on the Left Knee. The wound measures 0cm length x 0cm width x 0cm depth; 0cm^2 area and 0cm^3 volume. There is no tunneling or undermining noted. There is a none present amount of drainage noted. Foul odor after cleansing was noted. The wound margin is distinct with the outline attached to the wound base. There is no granulation within the wound bed. There is no necrotic tissue within the wound bed. The periwound skin appearance had no  abnormalities noted for texture. The periwound skin appearance had no abnormalities noted for moisture. The periwound skin appearance had no abnormalities noted for color. Periwound temperature was noted as No Abnormality. The periwound has tenderness on palpation. Wound #9 status is Open. Original cause of wound was Pressure Injury. The date acquired was: 10/22/2022. The wound has been in [name] 26 Megan. The wound is located on the Left Calcaneus. The wound measures 1.6cm length x 0.3cm width x 0.5cm depth; 0.377cm^2 area and 0.188cm^3 volume. There is Fat Layer (Subcutaneous Tissue) exposed. There is no tunneling noted, however, there is undermining starting at 12:00 and ending at 12:00 with a maximum distance of 0.4cm. There is a small amount of serous drainage noted. The wound margin is distinct with the outline attached to the wound base. There is medium (34-66%) red granulation within the wound bed. There is a medium (34-66%) amount of necrotic tissue within the wound bed including Adherent Slough. The periwound skin appearance had no abnormalities noted for moisture. The periwound skin appearance had no abnormalities noted for color. The periwound skin appearance exhibited: Callus. Periwound temperature was noted as No Abnormality. The periwound has tenderness on palpation. 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 There was a  Excisional Skin/Subcutaneous Tissue Debridement oe with  a total area of 0.24 sq cm performed by Megan Nest, MD. With the following instrument(s): Curette to remove Viable and Non-Viable tissue/material. Material removed includes Subcutaneous Tissue and Slough and after achieving pain control using Lidocaine  4% T opical Solution. No specimens were taken. A time out was conducted at 15:25, prior to the start of the procedure. A Minimum amount of bleeding was controlled with Pressure. The procedure was tolerated well. Post Debridement Measurements: 0.5cm length x 0.6cm width x 0.3cm depth; 0.071cm^3 volume. Post debridement Stage noted as Category/Stage IV. Character of Wound/Ulcer Post Debridement is improved. Post procedure Diagnosis Wound #10: Same as Pre-Procedure Wound #11 Pre-procedure diagnosis of Wound #11 is a Pressure Ulcer located on the Right T Second . There was a Excisional Skin/Subcutaneous Tissue Debridement oe with a total area of 0.22 sq cm performed by Megan Nest, MD. With the following instrument(s): Curette to remove Viable and Non-Viable tissue/material. Material removed includes Eschar, Subcutaneous Tissue, and Slough after achieving pain control using Lidocaine  4% T opical Solution. No specimens were taken. A time out was conducted at 15:25, prior to the start of the procedure. A Minimum amount of bleeding was controlled with Pressure. The procedure was tolerated well. Post Debridement Measurements: 0.4cm length x 0.7cm width x 0.3cm depth; 0.066cm^3 volume. Post debridement Stage noted as Category/Stage IV. Character of Wound/Ulcer Post Debridement is improved. Post procedure Diagnosis Wound #11: Same as Pre-Procedure Wound #13 Pre-procedure diagnosis of Wound #13 is an Arterial Insufficiency Ulcer located on the Left,Dorsal T Great .Severity of Tissue Pre Debridement is: Fat layer ALACIA, REHMANN ([id number]) [id number] Page 12 of 13 exposed. There was a Excisional Skin/Subcutaneous  Tissue Debridement with a total area of 1.13 sq cm performed by Megan Nest, MD. With the following instrument(s): Curette to remove Viable and Non-Viable tissue/material. Material removed includes Subcutaneous Tissue and Slough and after achieving pain control using Lidocaine  4% Topical Solution. No specimens were taken. A time out was conducted at 15:25, prior to the start of the procedure. A Minimum amount of bleeding was controlled with Pressure. The procedure was tolerated well. Post Debridement Measurements: 0.9cm length x 1.6cm width x 0.4cm depth; 0.452cm^3 volume. Character of Wound/Ulcer Post Debridement is improved. Severity of Tissue Post Debridement is: Fat layer exposed. Post procedure Diagnosis Wound #13: Same as Pre-Procedure Wound #9 Pre-procedure diagnosis of Wound #9 is a Pressure Ulcer located on the Left Calcaneus . There was a Excisional Skin/Subcutaneous Tissue Debridement with a total area of 0.38 sq cm performed by Megan Nest, MD. With the following instrument(s): Curette to remove Viable and Non-Viable tissue/material. Material removed includes Subcutaneous Tissue, Slough, Skin: Dermis, and Skin: Epidermis after achieving pain control using Lidocaine  4% T opical Solution. No specimens were taken. A time out was conducted at 15:25, prior to the start of the procedure. A Minimum amount of bleeding was controlled with Pressure. The procedure was tolerated well. Post Debridement Measurements: 1.6cm length x 0.3cm width x 0.5cm depth; 0.188cm^3 volume. Post debridement Stage noted as Category/Stage III. Character of Wound/Ulcer Post Debridement is improved. Post procedure Diagnosis Wound #9: Same as Pre-Procedure Plan Follow-up Appointments: Return Appointment in 2 Megan. - Dr. Marolyn 09/23/23 at 2:45pm Anesthetic: (In clinic) Topical Lidocaine  4% applied to wound bed Bathing/ Shower/ Hygiene: May shower and wash wound with soap and water. Off-Loading: Other: -  float heels with pillows under calves Additional Orders / Instructions: Follow Nutritious Diet - add in protein shakes every  day to diet - recommend premier protein 500 mg x3 a day vitamin C , zinc  30-50 mg per day Home Health: No change in wound care orders this week; continue Home Health for wound care. May utilize formulary equivalent dressing for wound [name] orders unless otherwise specified. Other Home Health Orders/Instructions: - Medi Home WOUND #10: - T Second Wound Laterality: Left oe Cleanser: Soap and Water 1 x Per Day/30 Days Discharge Instructions: May shower and wash wound with dial antibacterial soap and water prior to dressing change. Cleanser: Wound Cleanser 1 x Per Day/30 Days Discharge Instructions: Cleanse the wound with wound cleanser prior to applying a clean dressing using gauze sponges, not tissue or cotton balls. Peri-Wound Care: Sween Lotion (Moisturizing lotion) 1 x Per Day/30 Days Discharge Instructions: Apply moisturizing lotion as directed Prim Dressing: Promogran Prisma Matrix, 4.34 (sq in) (silver collagen) (Dispense As Written) 1 x Per Day/30 Days ary Discharge Instructions: Moisten collagen with saline or hydrogel Secondary Dressing: Woven Gauze Sponge, Non-Sterile 4x4 in (Generic) 1 x Per Day/30 Days Discharge Instructions: Apply over primary dressing as directed. Secured With: [employer], 4.5x3.1 (in/yd) (Generic) 1 x Per Day/30 Days Discharge Instructions: Secure with Kerlix as directed. Secured With: 30M Medipore H Soft Cloth Surgical T ape, 4 x 10 (in/yd) (Generic) 1 x Per Day/30 Days Discharge Instructions: Secure with tape as directed. WOUND #11: - T Second Wound Laterality: Right oe Cleanser: Soap and Water 1 x Per Day/30 Days Discharge Instructions: May shower and wash wound with dial antibacterial soap and water prior to dressing change. Cleanser: Wound Cleanser 1 x Per Day/30 Days Discharge Instructions: Cleanse the wound with wound  cleanser prior to applying a clean dressing using gauze sponges, not tissue or cotton balls. Prim Dressing: Promogran Prisma Matrix, 4.34 (sq in) (silver collagen) (Dispense As Written) 1 x Per Day/30 Days ary Discharge Instructions: Moisten collagen with saline or hydrogel Secondary Dressing: Woven Gauze Sponge, Non-Sterile 4x4 in (Generic) 1 x Per Day/30 Days Discharge Instructions: Apply over primary dressing as directed. Secured With: Insurance Underwriter, Sterile 2x75 (in/in) (Generic) 1 x Per Day/30 Days Discharge Instructions: Secure with stretch gauze as directed. Secured With: 30M Medipore H Soft Cloth Surgical T ape, 4 x 10 (in/yd) (Generic) 1 x Per Day/30 Days Discharge Instructions: Secure with tape as directed. WOUND #13: - T Great Wound Laterality: Dorsal, Left oe Cleanser: Soap and Water 1 x Per Day/30 Days Discharge Instructions: May shower and wash wound with dial antibacterial soap and water prior to dressing change. Cleanser: Wound Cleanser 1 x Per Day/30 Days Discharge Instructions: Cleanse the wound with wound cleanser prior to applying a clean dressing using gauze sponges, not tissue or cotton balls. Prim Dressing: Promogran Prisma Matrix, 4.34 (sq in) (silver collagen) (Dispense As Written) 1 x Per Day/30 Days ary Discharge Instructions: Moisten collagen with saline or hydrogel Secondary Dressing: Woven Gauze Sponge, Non-Sterile 4x4 in (Generic) 1 x Per Day/30 Days Discharge Instructions: Apply over primary dressing as directed. Secured With: 30M Medipore H Soft Cloth Surgical T ape, 4 x 10 (in/yd) (Generic) 1 x Per Day/30 Days Discharge Instructions: Secure with tape as directed. WOUND #9: - Calcaneus Wound Laterality: Left Cleanser: Soap and Water 1 x Per Day/30 Days Discharge Instructions: May shower and wash wound with dial antibacterial soap and water prior to dressing change. Cleanser: Wound Cleanser 1 x Per Day/30 Days Discharge Instructions: Cleanse  the wound with wound cleanser prior to applying a clean dressing using gauze sponges, not  tissue or cotton balls. Prim Dressing: Promogran Prisma Matrix, 4.34 (sq in) (silver collagen) (Dispense As Written) 1 x Per Day/30 Days ary Discharge Instructions: Moisten collagen with saline or hydrogel Secondary Dressing: ALLEVYN Heel 4 1/2in x 5 1/2in / 10.5cm x 13.5cm (Generic) 1 x Per Day/30 Days Discharge Instructions: Apply over primary dressing as directed. Secondary Dressing: Woven Gauze Sponge, Non-Sterile 4x4 in (Generic) 1 x Per Day/30 Days Discharge Instructions: Apply over primary dressing as directed. Secured With: [employer], 4.5x3.1 (in/yd) (Generic) 1 x Per Day/30 Days [name], [name] ([id number]) [id number] Page 13 of 13 Discharge Instructions: Secure with Kerlix as directed. Secured With: 30M Medipore H Soft Cloth Surgical T ape, 4 x 10 (in/yd) (Generic) 1 x Per Day/30 Days Discharge Instructions: Secure with tape as directed. 09/10/2023: The wound on her knee is healed. The heel wound is larger and deeper today. The wounds on her toes are unchanged. I used a curette to debride slough and subcutaneous tissue from the toe wounds and skin, slough, and subcutaneous tissue from the heel wound. We will continue Prisma silver collagen to all sites. I have reiterated to the patient's daughter that I do not think these wounds have any real chance of healing given the patient's multiple medical comorbidities and overall frail condition. Follow-up in 2 Megan. Electronic Signature(s) Signed: 09/10/2023 3:51:14 PM By: Megan Nest MD [name] Entered By: Megan Nest on 09/10/2023 15:51:14 -------------------------------------------------------------------------------- SuperBill Details Patient Name: Date of Service: [name]. 09/10/2023 Medical Record Number: [id number] Patient Account Number: [id number] Date of Birth/Sex: Treating RN: 05/14/1928 (89 y.o.  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 without evidence of necrosis I73.9 Peripheral vascular disease, unspecified I50.32 Chronic diastolic (congestive) heart failure N18.32 Chronic kidney disease, stage 3b I87.2 Venous insufficiency (chronic) (peripheral) Facility Procedures : CPT4 Code: [id number] Description: 11042 - DEB SUBQ TISSUE 20 SQ CM/< ICD-10 Diagnosis Description L89.623 Pressure ulcer of left heel, 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 wit L97.526 Non-pressure chronic ulcer of other  part of left foot with bone involvement with I73.9 Peripheral vascular disease, unspecified Modifier: hout evidence of necro out evidence of necros Quantity: 1 sis is Physician Procedures : CPT4 Code Description Modifier [id number] 11042 - WC PHYS SUBQ TISS 20 SQ CM ICD-10 Diagnosis Description L89.623 Pressure ulcer of left heel, 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

## 2023-09-14 ENCOUNTER — Telehealth: Payer: Self-pay | Admitting: Cardiology

## 2023-09-14 ENCOUNTER — Encounter (HOSPITAL_BASED_OUTPATIENT_CLINIC_OR_DEPARTMENT_OTHER): Payer: Self-pay | Admitting: Cardiology

## 2023-09-14 ENCOUNTER — Telehealth (INDEPENDENT_AMBULATORY_CARE_PROVIDER_SITE_OTHER): Payer: Medicare HMO | Admitting: Cardiology

## 2023-09-14 NOTE — Telephone Encounter (Signed)
 Pt daughter called in asking to r/s pt's tele visit. She states she spoke to someone last week and they were supposed to c/b to r/s it but never did.

## 2023-09-15 NOTE — Progress Notes (Signed)
 Unable to reach patient after multiple attempts for telephone visit. Will reschedule. Shelda Bruckner, MD, PhD, [facility]  [facility]  [place]  [facility] [address], [address] [place], [place] [address] [phone]

## 2023-09-21 NOTE — Telephone Encounter (Signed)
 Pt's daughter requesting cb to discuss  rescheduling pt's video visit and also wants to know if pt needs to come in or if it can still be a video visit. Also needs to discuss coumadin.

## 2023-09-22 NOTE — Telephone Encounter (Signed)
 Call and spoke to daughter. Appt rescheduled for 1/31 @ 11:40 (virtual) Daughter concerned on hospital discontinuing coumadin  and pt getting blood clots again. Dgtr would like PCP and Dr. Eston opinion on starting pt on another blood thinner. Pt currently on Aspirin . Pt to see PCP on Thursday.   Will route to MD and make dgtr aware of MD's recommendations. Dgtr verbalized understanding.

## 2023-09-23 ENCOUNTER — Encounter (HOSPITAL_BASED_OUTPATIENT_CLINIC_OR_DEPARTMENT_OTHER): Payer: Medicare HMO | Admitting: General Surgery

## 2023-09-23 DIAGNOSIS — L97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 without evidence of necrosis: Secondary | ICD-10-CM | POA: Diagnosis not present

## 2023-09-24 NOTE — Progress Notes (Signed)
TAYE, MAACK ([id number]) [id number] Page 1 of 14 Visit Report for 09/23/2023 Chief Complaint Document Details Patient Name: Date of Service: [name], [name] 09/23/2023 2:45 PM Medical Record Number: [id number] Patient Account Number: [id number] Date of Birth/Sex: Treating RN: [birth year] (89 y.o. F) Primary Care Provider: [name] Other Clinician: Referring Provider: Treating Provider/Extender: Tiajuana Amass in Treatment: 28 Information Obtained from: Patient Chief Complaint 01/28/2022; right second toe amputation site dehiscence and bilateral lower extremity wounds. 03/10/2023: pressure ulcers of right heel, ulcers on toes Electronic Signature(s) Signed: 09/23/2023 3:30:24 PM By: Duanne Guess MD FACS Entered By: Duanne Guess on 09/23/2023 15:30:24 -------------------------------------------------------------------------------- Debridement Details Patient Name: Date of Service: [name] 09/23/2023 2:45 PM Medical Record Number: [id number] Patient Account Number: [id number] Date of Birth/Sex: Treating RN: 1928-02-24 (89 y.o. [name] Primary Care Provider: [name] Other Clinician: Referring Provider: Treating Provider/Extender: Claudie Leach Weeks in Treatment: 28 Debridement Performed for Assessment: Wound #10 Left T Second oe Performed By: Physician Duanne Guess, MD Debridement Type: Debridement Level of Consciousness (Pre-procedure): Awake and Alert Pre-procedure Verification/Time Out Yes - 15:20 Taken: Start Time: 15:20 Pain Control: Lidocaine 5% topical ointment Percent of Wound Bed Debrided: 100% T Area Debrided (cm): otal 0.28 Tissue and other material debrided: Non-Viable, Eschar, Slough, Subcutaneous, Slough Level: Skin/Subcutaneous Tissue Debridement Description: Excisional Instrument: Curette Bleeding: Minimum Hemostasis Achieved: Pressure Response to  Treatment: Procedure was tolerated well Level of Consciousness (Post- Awake and Alert procedure): Post Debridement Measurements of Total Wound Length: (cm) 0.4 Stage: Category/Stage IV Width: (cm) 0.9 Depth: (cm) 0.2 Volume: (cm) 0.057 Character of Wound/Ulcer Post Debridement: Improved Post Procedure Diagnosis Borre, [name] ([id number]) [id number] Page 2 of 14 Same as Pre-procedure Electronic Signature(s) Signed: 09/23/2023 4:06:15 PM By: Duanne Guess MD FACS Signed: 09/23/2023 4:22:17 PM By: Redmond Pulling RN, BSN Entered By: Redmond Pulling on 09/23/2023 15:22:50 -------------------------------------------------------------------------------- Debridement Details Patient Name: Date of Service: [name] 09/23/2023 2:45 PM Medical Record Number: [id number] Patient Account Number: [id number] Date of Birth/Sex: Treating RN: November 03, 1927 (89 y.o. [name] Primary Care Provider: [name] Other Clinician: Referring Provider: Treating Provider/Extender: Claudie Leach Weeks in Treatment: 28 Debridement Performed for Assessment: Wound #13 Left,Dorsal T Great oe Performed By: Physician Duanne Guess, MD The following information was scribed by: Redmond Pulling The information was scribed for: Duanne Guess Debridement Type: Debridement Severity of Tissue Pre Debridement: Fat layer exposed Level of Consciousness (Pre-procedure): Awake and Alert Pre-procedure Verification/Time Out Yes - 15:20 Taken: Start Time: 15:20 Pain Control: Lidocaine 5% topical ointment Percent of Wound Bed Debrided: 100% T Area Debrided (cm): otal 0.94 Tissue and other material debrided: Non-Viable, Eschar, Slough, Subcutaneous, Slough Level: Skin/Subcutaneous Tissue Debridement Description: Excisional Instrument: Curette Bleeding: Minimum Hemostasis Achieved: Pressure Response to Treatment: Procedure was tolerated well Level of  Consciousness (Post- Awake and Alert procedure): Post Debridement Measurements of Total Wound Length: (cm) 0.8 Width: (cm) 1.5 Depth: (cm) 0.2 Volume: (cm) 0.188 Character of Wound/Ulcer Post Debridement: Improved Severity of Tissue Post Debridement: Fat layer exposed Post Procedure Diagnosis Same as Pre-procedure Electronic Signature(s) Signed: 09/23/2023 4:06:15 PM By: Duanne Guess MD FACS Signed: 09/23/2023 4:22:17 PM By: Redmond Pulling RN, BSN Entered By: Redmond Pulling on 09/23/2023 15:23:15 -------------------------------------------------------------------------------- Debridement Details Patient Name: Date of Service: [name] 09/23/2023 2:45 PM Medical Record Number: [id number] Patient Account Number: [id number] Harned, [name] ([id number]) [id number] Page 3 of 14 Date of Birth/Sex: Treating RN: [birth year] (89 y.o. F)  Redmond Pulling Primary Care Provider: Other Clinician: [name] Referring Provider: Treating Provider/Extender: Claudie Leach Weeks in Treatment: 28 Debridement Performed for Assessment: Wound #11 Right T Second oe Performed By: Physician Duanne Guess, MD The following information was scribed by: Redmond Pulling The information was scribed for: Duanne Guess Debridement Type: Debridement Level of Consciousness (Pre-procedure): Awake and Alert Pre-procedure Verification/Time Out Yes - 15:20 Taken: Start Time: 15:20 Pain Control: Lidocaine 5% topical ointment Percent of Wound Bed Debrided: 100% T Area Debrided (cm): otal 0.22 Tissue and other material debrided: Non-Viable, Eschar, Slough, Subcutaneous, Slough Level: Skin/Subcutaneous Tissue Debridement Description: Excisional Instrument: Curette Bleeding: Minimum Hemostasis Achieved: Pressure Response to Treatment: Procedure was tolerated well Level of Consciousness (Post- Awake and Alert procedure): Post Debridement Measurements of  Total Wound Length: (cm) 0.4 Stage: Category/Stage IV Width: (cm) 0.7 Depth: (cm) 0.3 Volume: (cm) 0.066 Character of Wound/Ulcer Post Debridement: Improved Post Procedure Diagnosis Same as Pre-procedure Electronic Signature(s) Signed: 09/23/2023 4:06:15 PM By: Duanne Guess MD FACS Signed: 09/23/2023 4:22:17 PM By: Redmond Pulling RN, BSN Entered By: Redmond Pulling on 09/23/2023 15:23:51 -------------------------------------------------------------------------------- Debridement Details Patient Name: Date of Service: [name] 09/23/2023 2:45 PM Medical Record Number: [id number] Patient Account Number: [id number] Date of Birth/Sex: Treating RN: 28-May-1928 (89 y.o. [name] Primary Care Provider: [name] Other Clinician: Referring Provider: Treating Provider/Extender: Claudie Leach Weeks in Treatment: 28 Debridement Performed for Assessment: Wound #9 Left Calcaneus Performed By: Physician Duanne Guess, MD The following information was scribed by: Redmond Pulling The information was scribed for: Duanne Guess Debridement Type: Debridement Level of Consciousness (Pre-procedure): Awake and Alert Pre-procedure Verification/Time Out Yes - 15:20 Taken: Start Time: 15:20 Pain Control: Lidocaine 5% topical ointment Percent of Wound Bed Debrided: 100% T Area Debrided (cm): otal 0.07 Tissue and other material debrided: Non-Viable, Eschar, Slough, Subcutaneous, Slough Level: Skin/Subcutaneous Tissue Debridement Description: Excisional Instrument: Curette Bleeding: Minimum Hemostasis Achieved: Pressure Costanza, [name] ([id number]) [id number] Page 4 of 14 Response to Treatment: Procedure was tolerated well Level of Consciousness (Post- Awake and Alert procedure): Post Debridement Measurements of Total Wound Length: (cm) 0.3 Stage: Category/Stage III Width: (cm) 0.3 Depth: (cm) 0.3 Volume: (cm) 0.021 Character  of Wound/Ulcer Post Debridement: Improved Post Procedure Diagnosis Same as Pre-procedure Electronic Signature(s) Signed: 09/23/2023 4:06:15 PM By: Duanne Guess MD FACS Signed: 09/23/2023 4:22:17 PM By: Redmond Pulling RN, BSN Entered By: Redmond Pulling on 09/23/2023 15:24:23 -------------------------------------------------------------------------------- HPI Details Patient Name: Date of Service: [name] 09/23/2023 2:45 PM Medical Record Number: [id number] Patient Account Number: [id number] Date of Birth/Sex: Treating RN: [birth year] (89 y.o. F) Primary Care Provider: [name] Other Clinician: Referring Provider: Treating Provider/Extender: Claudie Leach Weeks in Treatment: 28 History of Present Illness HPI Description: Admission 01/28/2022 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 She states she has tried Medihoney and silver alginate in the past to this area with little benefit. She also has 2 small areas limited to skin breakdown to her lower extremities bilaterally. She has chronic venous insufficiency but not has not been wearing her compression stockings. She states she bumped her legs against an object and not so the wound started. She has been using Medihoney to the sites.  She denies signs of infection. 6/2; patient presents for follow-up. She had an x-ray of her right foot done at last clinic visit and this was negative for evidence of osteomyelitis. She also had a wound culture done that showed extra high levels of Staph aureus. I recommended Keystone antibiotics for this and this was ordered. She had ABIs with TBI's done as well that showed monophasic waveforms to the  right foot with TBI of 0 and an ABI of 0.52. Urgent referral was made to vein and vascular and she saw Dr. Durwin Nora on 6/1, yesterday and he recommended an arteriogram. This is scheduled for 6/16. Patient also reports a new wound to the right great toe. This is a blister that has ruptured. She also reports increased erythema to the toe. 6/6; the patient was worked in urgently today at the insistence of her daughter out of concern for a new wound on the lateral part of the plantar right great toe. She has her original postsurgical wound after the amputation of the right second toe, she has a wound on the medial part of the right great toe. The patient is apparently going for an angiogram by Dr. Durwin Nora in 2 weeks time. 6/13; patient presents for follow-up. She has been using bacitracin to the abrasion on the right great toe. She has been using collagen and Keystone antibiotic to the amputation site. She has no issues or complaints today. She denies signs of infection. 6/22; patient presents for follow-up. She states that her abdominal aortogram was canceled due to her renal function. She has been using Keystone antibiotics to the amputation site and Medihoney to the right great toe wound. At the pace of the right great toe she has a slitlike open area that she thinks was caused by the tape from the dressing. 6/29; patient presents for follow-up. She has been using Keystone antibiotics to the amputation site along with collagen. She has been using Medihoney to the right great toe wound. She has no other wounds. She denies signs of infection. 7/13; patient presents for follow-up. She has been using Keystone antibiotics and collagen to the wound sites. She currently denies signs of infection. She states she is scheduled to see me nephrology next month. 7/20; patient presents for follow-up. She continue Keystone antibiotics and collagen to the wound sites. She has no issues or complaints today. 8/1; patient  presents for follow up. She continues to use keystone antibiotics and collagen to the wound sites. She has no issues or complaints today. 8/15; patient presents for follow-up. She has been using Keystone antibiotics and collagen to the wound sites. She followed up with her nephrologist who made medication changes. She is supposed to get a repeat BMP in 2 weeks. Her decrease in renal function was a limiting factor in obtaining an arteriogram for potential intervention for revascularization. She currently denies signs of infection. 9/2; patient presents for follow-up. She has been using Keystone antibiotic and collagen to the wound beds. She has no issues or complaints today. Reports there has been improvement in kidney function however not cleared to have her arteriogram just yet. 10/10; patient presents for follow-up. She has been using Keystone antibiotic and collagen to the wound beds. She reports 2 new wounds 1 to the anterior right Ratchford, [name] ([id number]) [id number] Page 5 of 14 lower extremity and another to the plantar aspect of the right foot. She states that the right plantar foot wound was caused by the home health nurse changing the dressing  and causing a skin tear. She is not sure how the right anterior leg wound started. It appears to be from trauma. She denies signs of infection. 10/19; patient presents for follow-up. She has been using Keystone antibiotic and collagen to the right great toe wound. She is been using silver alginate to the right anterior and right plantar foot wound. She has been using Tubigrip to the right lower extremity. The plantar foot wound has healed. She has no issues or complaints today. 11/3; since the patient was last here she was seen in urgent care apparently for an area on the dorsal aspect of the right fifth toe perhaps over the PIP. I saw a picture of this on the daughter's cell phone. There was slough on this. Urgent care  gave him doxycycline. She is also changed the dressing to all wounds back the [facility] and collagen which includes her right leg and left first toe 11/16; patient presents for follow-up. She has a new wound to the left knee. She states she fell. She has been using antibiotic ointment and collagen to this area. She has been using collagen and Keystone antibiotic to the right great toe wound. The anterior right leg wound is healed. She denies signs of infection. 11/30; patient presents for follow-up. The right great toe wound has healed. She has 1 remaining wound to the left knee. She has been using Hydrofera Blue and Medihoney here. 12/19; patient presents for follow-up. Her left knee wound has healed. She has no issues or complaints today. 09/30/2022 Patient's daughter called for an appointment due to increased swelling to her lower extremities bilaterally. Today patient presents with increased swelling to her lower extremities although there is no increased redness or warmth. She was evaluated by her nephrologist who increased her diuretics. Per patient and daughter her swelling has gone down to the leg Over the past several days. She does not have any open wounds. She was advised to elevate her legs and not consume excess salt By her PCP and nephrologist. Patient has compression stockings that she has been using sporadically. READMISSION 03/10/2023 Since her last visit to the clinic, the patient has been hospitalized at least twice, in February with COVID pneumonia and CHF exacerbation. She was discharged to a skilled nursing facility and then readmitted in April with fevers. She was noted to have pressure ulcers on her heels and sacrum. Per family declined skilled nursing placement upon discharge and she has apparently been residing at home. She has followed with the vascular surgery clinic regarding peripheral artery disease and due to ulcerations on her feet, she ultimately underwent arteriography  with balloon angioplasties of the superficial femoral artery, popliteal artery and peroneal artery on the left, along with shockwave ultrasound assisted balloon angioplasty of the popliteal artery and distal SFA. Given her multiple significant medical comorbidities, she is not a candidate for open surgery. She is deemed to be maximally vascularized this procedure, which was performed on 04 March 2023. T oday, she has a stage III pressure ulcer on her left heel and wounds on the PIP joints of her right third and left second toes. No sacral wound is present and the ulcer on her left heel has closed. 03/18/2023: She has a new ulcer on her left great toe. It was just noticed yesterday and the etiology is unknown. The fat layer is exposed and there is some slough accumulation. The other wounds are all essentially unchanged in size. There is a little bit more granulation tissue on  the other toe wounds, but bone is still frankly exposed on both sides. The heel ulcer has thick slough accumulation. 03/26/2023: She has a new wound on the tip of her left third toe. It is black. Neither the patient nor her daughter are aware of how it began. The other wounds are stable. 03/31/2023: The culture that I took from her left third toe returned with MRSA. We contacted the patient's daughter to have her stop levofloxacin and start doxycycline, but she has not yet picked up the new antibiotic. All of her wounds have accumulated slough. Bone remains exposed. She is scheduled to follow- up with vascular surgery tomorrow to evaluate the patency of her revascularization. 04/09/2023: She saw her vascular surgeon and her ABIs are improved; her TBI's could not be checked due to her bandages. She did finally start taking the prescribed doxycycline. All of her wounds look about the same today. 04/23/2023: No significant changes to any of her wounds aside from being slightly smaller other than the left dorsal great toe wound, which is  stable. She has accumulated slough and eschar on all of the surfaces. 04/30/2023: All of the wounds are stable with the exception of the left dorsal great toe wound which now has tendon exposed in addition to bone. There is slough and eschar accumulation on all sites. 05/15/2023: The patient was in the hospital and missed her appointment last week. The wound at the tip of her left third toe is healed. Everything else looks the same. 05/21/2023: No real change to any of the wounds. 05/28/2023: Once again, there has been no real change to any of her wounds except that the wound at the tip of her left great toe is closed. 06/11/2019: No change to any of her wounds. 06/25/2023: The wound on her right great toe nailbed has healed. The other wounds on her toes are unchanged and continue to have bone/joints exposed. The ulcer on her heel is smaller and shallower with very little necrotic tissue present. She fell last week and split her knee open. Apparently she presented to the ED greater than 12 hours after injury and they attempted to loosely suture the site. The sutures have pulled through her skin and they are doing absolutely nothing at this point. The wound is full of nonviable tissue. The muscle fascia is visible. Her PCP has her on doxycycline. 07/08/2023: The wounds on her feet are basically unchanged although the heel ulcer may be slightly smaller. The wound on her left knee looks much better this week. It is cleaner but still has a layer of slough on the surface. I can no longer see the muscle fascia. 07/22/2023: The heel ulcer is smaller and shallower. The left knee wound has also closed up considerably with just a small superficial open area that remains. The wounds on her toes are unchanged with persistent bone exposure. 07/29/2023: The knee wound is smaller again today. There is a little bit of dry skin around the edges and thin slough on the surface. The other wounds are unchanged. 08/26/2023:  The knee wound is nearly closed. It is very superficial with a clean surface. The wound on her heel is also smaller and shallower. The other wounds are stable. 09/10/2023: The wound on her knee is healed. The heel wound is larger and deeper today. The wounds on her toes are unchanged. 09/23/2023: The wound on her knee has reopened. It is more superficial than the previous occasion and only extends to the fat layer. The  heel wound is a little bit smaller and shallower today. The wounds on her toes are unchanged. Electronic Signature(s) Signed: 09/23/2023 3:31:05 PM By: Duanne Guess MD FACS Entered By: Duanne Guess on 09/23/2023 15:31:05 Schone, [name] ([id number]) [id number] Page 6 of 14 -------------------------------------------------------------------------------- Physical Exam Details Patient Name: Date of Service: [name], [name] 09/23/2023 2:45 PM Medical Record Number: [id number] Patient Account Number: [id number] Date of Birth/Sex: Treating RN: [birth year] (89 y.o. F) Primary Care Provider: [name] Other Clinician: Referring Provider: Treating Provider/Extender: Claudie Leach Weeks in Treatment: 28 Constitutional Slightly hypertensive. . . . Chronically ill-appearing, but in no acute distress.[name] Kitchen Respiratory Normal work of breathing on supplemental oxygen. Notes 09/23/2023: The wound on her knee has reopened. It is more superficial than the previous occasion and only extends to the fat layer. The heel wound is a little bit smaller and shallower today. The wounds on her toes are unchanged. Electronic Signature(s) Signed: 09/23/2023 3:31:36 PM By: Duanne Guess MD FACS Entered By: Duanne Guess on 09/23/2023 15:31:36 -------------------------------------------------------------------------------- Physician Orders Details Patient Name: Date of Service: [name] 09/23/2023 2:45 PM Medical Record Number:  [id number] Patient Account Number: [id number] Date of Birth/Sex: Treating RN: [birth year] (89 y.o. [name] Primary Care Provider: [name] Other Clinician: Referring Provider: Treating Provider/Extender: Claudie Leach Weeks in Treatment: 28 Verbal / Phone Orders: No Diagnosis Coding ICD-10 Coding Code Description [phone] Pressure ulcer of left heel, 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 without evidence of necrosis L97.822 Non-pressure chronic ulcer of other part of left lower leg with fat layer exposed I73.9 Peripheral vascular disease, unspecified I50.32 Chronic diastolic (congestive) heart failure N18.32 Chronic kidney disease, stage 3b I87.2 Venous insufficiency (chronic) (peripheral) Follow-up Appointments ppointment in 2 weeks. - Dr. Lady Gary Return A 10/07/23 at 2:45pm Anesthetic (In clinic) Topical Lidocaine 4% applied to wound bed Bathing/ Shower/ Hygiene May shower and wash wound with soap and water. [name], [name] ([id number]) [id number] Page 7 of 14 Off-Loading Other: - float heels with pillows under calves Additional Orders / Instructions Follow Nutritious Diet - add in protein shakes every day to diet - recommend premier protein 500 mg x3 a day vitamin C, zinc 30-50 mg per day Home Health No change in wound care orders this week; continue Home Health for wound care. May utilize formulary equivalent dressing for wound treatment orders unless otherwise specified. Other Home Health Orders/Instructions: - Medi Home Wound Treatment Wound #10 - T Second oe Wound Laterality: Left Cleanser: Soap and Water 1 x Per Day/30 Days Discharge Instructions: May shower and wash wound with dial antibacterial soap and water prior to dressing change. Cleanser:  Wound Cleanser 1 x Per Day/30 Days Discharge Instructions: Cleanse the wound with wound cleanser prior to applying a clean dressing using gauze sponges, not tissue or cotton balls. Peri-Wound Care: Sween Lotion (Moisturizing lotion) 1 x Per Day/30 Days Discharge Instructions: Apply moisturizing lotion as directed Prim Dressing: Promogran Prisma Matrix, 4.34 (sq in) (silver collagen) (Dispense As Written) 1 x Per Day/30 Days ary Discharge Instructions: Moisten collagen with saline or hydrogel Secondary Dressing: Woven Gauze Sponge, Non-Sterile 4x4 in (Generic) 1 x Per Day/30 Days Discharge Instructions: Apply over primary dressing as directed. Secured With: [employer], 4.5x3.1 (in/yd) (Generic) 1 x Per Day/30 Days Discharge Instructions: Secure with Kerlix as directed. Secured With:  61M Medipore H Soft Cloth Surgical T ape, 4 x 10 (in/yd) (Generic) 1 x Per Day/30 Days Discharge Instructions: Secure with tape as directed. Wound #11 - T Second oe Wound Laterality: Right Cleanser: Soap and Water 1 x Per Day/30 Days Discharge Instructions: May shower and wash wound with dial antibacterial soap and water prior to dressing change. Cleanser: Wound Cleanser 1 x Per Day/30 Days Discharge Instructions: Cleanse the wound with wound cleanser prior to applying a clean dressing using gauze sponges, not tissue or cotton balls. Prim Dressing: Promogran Prisma Matrix, 4.34 (sq in) (silver collagen) (Dispense As Written) 1 x Per Day/30 Days ary Discharge Instructions: Moisten collagen with saline or hydrogel Secondary Dressing: Woven Gauze Sponge, Non-Sterile 4x4 in (Generic) 1 x Per Day/30 Days Discharge Instructions: Apply over primary dressing as directed. Secured With: Insurance underwriter, Sterile 2x75 (in/in) (Generic) 1 x Per Day/30 Days Discharge Instructions: Secure with stretch gauze as directed. Secured With: 61M Medipore H Soft Cloth Surgical T ape, 4 x 10 (in/yd) (Generic) 1 x  Per Day/30 Days Discharge Instructions: Secure with tape as directed. Wound #13 - T Great oe Wound Laterality: Dorsal, Left Cleanser: Soap and Water 1 x Per Day/30 Days Discharge Instructions: May shower and wash wound with dial antibacterial soap and water prior to dressing change. Cleanser: Wound Cleanser 1 x Per Day/30 Days Discharge Instructions: Cleanse the wound with wound cleanser prior to applying a clean dressing using gauze sponges, not tissue or cotton balls. Prim Dressing: Promogran Prisma Matrix, 4.34 (sq in) (silver collagen) (Dispense As Written) 1 x Per Day/30 Days ary Discharge Instructions: Moisten collagen with saline or hydrogel Secondary Dressing: Woven Gauze Sponge, Non-Sterile 4x4 in (Generic) 1 x Per Day/30 Days Discharge Instructions: Apply over primary dressing as directed. Secured With: 61M Medipore H Soft Cloth Surgical T ape, 4 x 10 (in/yd) (Generic) 1 x Per Day/30 Days Discharge Instructions: Secure with tape as directed. Wound #9 - Calcaneus Wound Laterality: Left Cleanser: Soap and Water 1 x Per Day/30 Days Discharge Instructions: May shower and wash wound with dial antibacterial soap and water prior to dressing change. Cleanser: Wound Cleanser 1 x Per Day/30 Days Discharge Instructions: Cleanse the wound with wound cleanser prior to applying a clean dressing using gauze sponges, not tissue or cotton balls. ALAINNAH, [name] ([id number]) [id number] Page 8 of 14 Prim Dressing: Promogran Prisma Matrix, 4.34 (sq in) (silver collagen) (Dispense As Written) 1 x Per Day/30 Days ary Discharge Instructions: Moisten collagen with saline or hydrogel Secondary Dressing: ALLEVYN Heel 4 1/2in x 5 1/2in / 10.5cm x 13.5cm (Generic) 1 x Per Day/30 Days Discharge Instructions: Apply over primary dressing as directed. Secondary Dressing: Woven Gauze Sponge, Non-Sterile 4x4 in (Generic) 1 x Per Day/30 Days Discharge Instructions: Apply over primary  dressing as directed. Secured With: [employer], 4.5x3.1 (in/yd) (Generic) 1 x Per Day/30 Days Discharge Instructions: Secure with Kerlix as directed. Secured With: 61M Medipore H Soft Cloth Surgical T ape, 4 x 10 (in/yd) (Generic) 1 x Per Day/30 Days Discharge Instructions: Secure with tape as directed. Patient Medications llergies: penicillin, erythromycin base, green dyes, iodine, sulfasalazine, tramadol, oxycodone, codeine A Notifications Medication Indication Start End 09/23/2023 lidocaine DOSE topical 5 % ointment - ointment topical once daily Electronic Signature(s) Signed: 09/23/2023 4:06:15 PM By: Duanne Guess MD FACS Entered By: Duanne Guess on 09/23/2023 15:34:35 -------------------------------------------------------------------------------- Problem List Details Patient Name: Date of Service: [name] 09/23/2023 2:45 PM Medical Record Number: [id number] Patient Account Number:  [id number] Date of Birth/Sex: Treating RN: 04/13/1928 (89 y.o. 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 03/10/2023 No Yes without evidence of necrosis 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 03/10/2023 No Yes without evidence of necrosis L97.822 Non-pressure chronic ulcer of other part of left lower leg with fat layer exposed1/15/2025 No Yes I73.9 Peripheral vascular disease, unspecified 03/10/2023 No Yes I50.32 Chronic diastolic (congestive) heart failure 03/10/2023 No Yes Zambrana, [name] ([id number]) [id number] Page 9 of 14 N18.32 Chronic kidney disease, stage 3b 03/10/2023 No  Yes I87.2 Venous insufficiency (chronic) (peripheral) 03/10/2023 No Yes Inactive Problems ICD-10 Code Description Active Date Inactive Date L97.522 Non-pressure chronic ulcer of other part of left foot with fat layer exposed 03/18/2023 03/18/2023 Resolved Problems ICD-10 Code Description Active Date Resolved Date L97.825 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 involvement 06/25/2023 06/25/2023 without evidence of necrosis Electronic Signature(s) Signed: 09/23/2023 3:30:07 PM By: Duanne Guess MD FACS Entered By: Duanne Guess on 09/23/2023 15:30:07 -------------------------------------------------------------------------------- Progress Note Details Patient Name: Date of Service: [name] 09/23/2023 2:45 PM Medical Record Number: [id number] Patient Account Number: [id number] Date of Birth/Sex: Treating RN: October 11, 1927 (89 y.o. F) Primary Care Provider: [name] Other Clinician: Referring Provider: Treating Provider/Extender: Claudie Leach Weeks in Treatment: 28 Subjective Chief Complaint Information obtained from Patient 01/28/2022; right second toe amputation site dehiscence and bilateral lower extremity wounds. 03/10/2023: pressure ulcers of right heel, ulcers on toes History of Present Illness (HPI) Admission 01/28/2022 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 She states she has tried Medihoney and silver alginate in the past to this area with little benefit. She also has 2 small areas limited to skin breakdown to her lower extremities bilaterally. She has chronic venous insufficiency but not has not been wearing her compression stockings. She  states she bumped her legs against an object and not so the wound started. She has been using Medihoney to the sites. She denies signs of infection. 6/2; patient presents for follow-up. She had an x-ray of her right foot done at last clinic visit and this was negative for evidence of osteomyelitis. She also had a wound culture done that showed extra high levels of Staph aureus. I recommended Keystone antibiotics for this and this was ordered. She had ABIs with TBI's done as well that showed monophasic waveforms to the right foot with TBI of 0 and an ABI of 0.52. Urgent referral was made to vein and vascular and she saw Dr. Durwin Nora on 6/1, yesterday and he recommended an arteriogram. This is scheduled for 6/16. Patient also reports a new wound to the right great toe. This is a blister that has ruptured. She also reports increased erythema to the toe. 6/6; the patient was worked in urgently today at the insistence of her daughter out of concern for a new wound on the lateral part of the plantar right great toe. She  has her original postsurgical wound after the amputation of the right second toe, she has a wound on the medial part of the right great toe. The patient is apparently going for an angiogram by Dr. Durwin Nora in 2 weeks time. 6/13; patient presents for follow-up. She has been using bacitracin to the abrasion on the right great toe. She has been using collagen and Keystone antibiotic to the amputation site. She has no issues or complaints today. She denies signs of infection. [name], [name] ([id number]) [id number] Page 10 of 14 6/22; patient presents for follow-up. She states that her abdominal aortogram was canceled due to her renal function. She has been using Keystone antibiotics to the amputation site and Medihoney to the right great toe wound. At the pace of the right great toe she has a slitlike open area that she thinks was caused by the tape from the  dressing. 6/29; patient presents for follow-up. She has been using Keystone antibiotics to the amputation site along with collagen. She has been using Medihoney to the right great toe wound. She has no other wounds. She denies signs of infection. 7/13; patient presents for follow-up. She has been using Keystone antibiotics and collagen to the wound sites. She currently denies signs of infection. She states she is scheduled to see me nephrology next month. 7/20; patient presents for follow-up. She continue Keystone antibiotics and collagen to the wound sites. She has no issues or complaints today. 8/1; patient presents for follow up. She continues to use keystone antibiotics and collagen to the wound sites. She has no issues or complaints today. 8/15; patient presents for follow-up. She has been using Keystone antibiotics and collagen to the wound sites. She followed up with her nephrologist who made medication changes. She is supposed to get a repeat BMP in 2 weeks. Her decrease in renal function was a limiting factor in obtaining an arteriogram for potential intervention for revascularization. She currently denies signs of infection. 9/2; patient presents for follow-up. She has been using Keystone antibiotic and collagen to the wound beds. She has no issues or complaints today. Reports there has been improvement in kidney function however not cleared to have her arteriogram just yet. 10/10; patient presents for follow-up. She has been using Keystone antibiotic and collagen to the wound beds. She reports 2 new wounds 1 to the anterior right lower extremity and another to the plantar aspect of the right foot. She states that the right plantar foot wound was caused by the home health nurse changing the dressing and causing a skin tear. She is not sure how the right anterior leg wound started. It appears to be from trauma. She denies signs of infection. 10/19; patient presents for follow-up. She has been  using Keystone antibiotic and collagen to the right great toe wound. She is been using silver alginate to the right anterior and right plantar foot wound. She has been using Tubigrip to the right lower extremity. The plantar foot wound has healed. She has no issues or complaints today. 11/3; since the patient was last here she was seen in urgent care apparently for an area on the dorsal aspect of the right fifth toe perhaps over the PIP. I saw a picture of this on the daughter's cell phone. There was slough on this. Urgent care gave him doxycycline. She is also changed the dressing to all wounds back the [facility] and collagen which includes her right leg and left first toe 11/16; patient presents for follow-up. She has  a new wound to the left knee. She states she fell. She has been using antibiotic ointment and collagen to this area. She has been using collagen and Keystone antibiotic to the right great toe wound. The anterior right leg wound is healed. She denies signs of infection. 11/30; patient presents for follow-up. The right great toe wound has healed. She has 1 remaining wound to the left knee. She has been using Hydrofera Blue and Medihoney here. 12/19; patient presents for follow-up. Her left knee wound has healed. She has no issues or complaints today. 09/30/2022 Patient's daughter called for an appointment due to increased swelling to her lower extremities bilaterally. Today patient presents with increased swelling to her lower extremities although there is no increased redness or warmth. She was evaluated by her nephrologist who increased her diuretics. Per patient and daughter her swelling has gone down to the leg Over the past several days. She does not have any open wounds. She was advised to elevate her legs and not consume excess salt By her PCP and nephrologist. Patient has compression stockings that she has been using sporadically. READMISSION 03/10/2023 Since her last visit to the  clinic, the patient has been hospitalized at least twice, in February with COVID pneumonia and CHF exacerbation. She was discharged to a skilled nursing facility and then readmitted in April with fevers. She was noted to have pressure ulcers on her heels and sacrum. Per family declined skilled nursing placement upon discharge and she has apparently been residing at home. She has followed with the vascular surgery clinic regarding peripheral artery disease and due to ulcerations on her feet, she ultimately underwent arteriography with balloon angioplasties of the superficial femoral artery, popliteal artery and peroneal artery on the left, along with shockwave ultrasound assisted balloon angioplasty of the popliteal artery and distal SFA. Given her multiple significant medical comorbidities, she is not a candidate for open surgery. She is deemed to be maximally vascularized this procedure, which was performed on 04 March 2023. T oday, she has a stage III pressure ulcer on her left heel and wounds on the PIP joints of her right third and left second toes. No sacral wound is present and the ulcer on her left heel has closed. 03/18/2023: She has a new ulcer on her left great toe. It was just noticed yesterday and the etiology is unknown. The fat layer is exposed and there is some slough accumulation. The other wounds are all essentially unchanged in size. There is a little bit more granulation tissue on the other toe wounds, but bone is still frankly exposed on both sides. The heel ulcer has thick slough accumulation. 03/26/2023: She has a new wound on the tip of her left third toe. It is black. Neither the patient nor her daughter are aware of how it began. The other wounds are stable. 03/31/2023: The culture that I took from her left third toe returned with MRSA. We contacted the patient's daughter to have her stop levofloxacin and start doxycycline, but she has not yet picked up the new antibiotic. All of  her wounds have accumulated slough. Bone remains exposed. She is scheduled to follow- up with vascular surgery tomorrow to evaluate the patency of her revascularization. 04/09/2023: She saw her vascular surgeon and her ABIs are improved; her TBI's could not be checked due to her bandages. She did finally start taking the prescribed doxycycline. All of her wounds look about the same today. 04/23/2023: No significant changes to any of her wounds  aside from being slightly smaller other than the left dorsal great toe wound, which is stable. She has accumulated slough and eschar on all of the surfaces. 04/30/2023: All of the wounds are stable with the exception of the left dorsal great toe wound which now has tendon exposed in addition to bone. There is slough and eschar accumulation on all sites. 05/15/2023: The patient was in the hospital and missed her appointment last week. The wound at the tip of her left third toe is healed. Everything else looks the same. 05/21/2023: No real change to any of the wounds. 05/28/2023: Once again, there has been no real change to any of her wounds except that the wound at the tip of her left great toe is closed. 06/11/2019: No change to any of her wounds. 06/25/2023: The wound on her right great toe nailbed has healed. The other wounds on her toes are unchanged and continue to have bone/joints exposed. The ulcer on her heel is smaller and shallower with very little necrotic tissue present. She fell last week and split her knee open. Apparently she presented to the ED greater than 12 hours after injury and they attempted to loosely suture the site. The sutures have pulled through her skin and they are doing absolutely nothing at this point. The wound is full of nonviable tissue. The muscle fascia is visible. Her PCP has her on doxycycline. [name], [name] ([id number]) [id number] Page 11 of 14 07/08/2023: The wounds on her feet are basically unchanged  although the heel ulcer may be slightly smaller. The wound on her left knee looks much better this week. It is cleaner but still has a layer of slough on the surface. I can no longer see the muscle fascia. 07/22/2023: The heel ulcer is smaller and shallower. The left knee wound has also closed up considerably with just a small superficial open area that remains. The wounds on her toes are unchanged with persistent bone exposure. 07/29/2023: The knee wound is smaller again today. There is a little bit of dry skin around the edges and thin slough on the surface. The other wounds are unchanged. 08/26/2023: The knee wound is nearly closed. It is very superficial with a clean surface. The wound on her heel is also smaller and shallower. The other wounds are stable. 09/10/2023: The wound on her knee is healed. The heel wound is larger and deeper today. The wounds on her toes are unchanged. 09/23/2023: The wound on her knee has reopened. It is more superficial than the previous occasion and only extends to the fat layer. The heel wound is a little bit smaller and shallower today. The wounds on her toes are unchanged. Objective Constitutional Slightly hypertensive. Chronically ill-appearing, but in no acute distress.. Vitals Time Taken: 2:54 AM, Height: 64 in, Weight: 157 lbs, BMI: 26.9, Temperature: 98.2 F, Pulse: 84 bpm, Respiratory Rate: 18 breaths/min, Blood Pressure: 148/56 mmHg. Respiratory Normal work of breathing on supplemental oxygen. General Notes: 09/23/2023: The wound on her knee has reopened. It is more superficial than the previous occasion and only extends to the fat layer. The heel wound is a little bit smaller and shallower today. The wounds on her toes are unchanged. Integumentary (Hair, Skin) Wound #10 status is Open. Original cause of wound was Pressure Injury. The date acquired was: 10/09/2022. The wound has been in treatment 28 weeks. The wound is located on the Left T Second. The  wound measures 0.4cm length x 0.9cm width x 0.3cm depth; 0.283cm^2 area  and 0.085cm^3 volume. There is oe bone, joint, and Fat Layer (Subcutaneous Tissue) exposed. There is a medium amount of serous drainage noted. The wound margin is distinct with the outline attached to the wound base. There is small (1-33%) red, pink granulation within the wound bed. There is a large (67-100%) amount of necrotic tissue within the wound bed including Adherent Slough. The periwound skin appearance had no abnormalities noted for texture. The periwound skin appearance had no abnormalities noted for moisture. The periwound skin appearance had no abnormalities noted for color. Periwound temperature was noted as No Abnormality. The periwound has tenderness on palpation. Wound #11 status is Open. Original cause of wound was Pressure Injury. The date acquired was: 10/09/2022. The wound has been in treatment 28 weeks. The wound is located on the Right T Second. The wound measures 0.4cm length x 0.7cm width x 0.3cm depth; 0.22cm^2 area and 0.066cm^3 volume. There is oe bone, joint, and Fat Layer (Subcutaneous Tissue) exposed. There is a medium amount of serous drainage noted. The wound margin is distinct with the outline attached to the wound base. There is medium (34-66%) red, pink granulation within the wound bed. There is a small (1-33%) amount of necrotic tissue within the wound bed including Adherent Slough. The periwound skin appearance had no abnormalities noted for texture. The periwound skin appearance had no abnormalities noted for moisture. The periwound skin appearance had no abnormalities noted for color. Periwound temperature was noted as No Abnormality. The periwound has tenderness on palpation. Wound #13 status is Open. Original cause of wound was Gradually Appeared. The date acquired was: 03/24/2023. The wound has been in treatment 25 weeks. The wound is located on the Left,Dorsal T Great. The wound measures  0.8cm length x 1.5cm width x 0.2cm depth; 0.942cm^2 area and 0.188cm^3 volume. oe There is bone and Fat Layer (Subcutaneous Tissue) exposed. There is a medium amount of serous drainage noted. The wound margin is distinct with the outline attached to the wound base. There is small (1-33%) red, pink granulation within the wound bed. There is a large (67-100%) amount of necrotic tissue within the wound bed including Adherent Slough and Necrosis of Bone. The periwound skin appearance had no abnormalities noted for texture. The periwound skin appearance had no abnormalities noted for moisture. The periwound skin appearance had no abnormalities noted for color. Periwound temperature was noted as No Abnormality. The periwound has tenderness on palpation. Wound #9 status is Open. Original cause of wound was Pressure Injury. The date acquired was: 10/22/2022. The wound has been in treatment 28 weeks. The wound is located on the Left Calcaneus. The wound measures 0.3cm length x 0.3cm width x 0.3cm depth; 0.071cm^2 area and 0.021cm^3 volume. There is Fat Layer (Subcutaneous Tissue) exposed. There is a small amount of serous drainage noted. The wound margin is distinct with the outline attached to the wound base. There is medium (34-66%) red granulation within the wound bed. There is a medium (34-66%) amount of necrotic tissue within the wound bed including Adherent Slough. The periwound skin appearance had no abnormalities noted for moisture. The periwound skin appearance had no abnormalities noted for color. The periwound skin appearance exhibited: Callus. Periwound temperature was noted as No Abnormality. The periwound has tenderness on palpation. 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 There was a Excisional Skin/Subcutaneous Tissue Debridement oe with a total area of 0.28 sq cm performed by Duanne Guess, MD. With the following instrument(s): Curette to remove Non-Viable tissue/material. Material removed includes Eschar, Subcutaneous Tissue, and Slough after achieving pain control using Lidocaine 5% topical ointment. No specimens were taken. A time out was conducted at 15:20, prior to the start of the procedure. A Minimum amount of bleeding was controlled with Pressure. The procedure was tolerated well. Post Debridement Measurements: 0.4cm length x 0.9cm width x 0.2cm depth; 0.057cm^3 volume. Post debridement Stage noted as Category/Stage IV. Character of Wound/Ulcer Post Debridement is improved. Post procedure Diagnosis Wound #10: Same as Pre-Procedure Wound #11 Pre-procedure diagnosis of Wound #11 is a Pressure Ulcer located on the Right T Second . There was a Excisional Skin/Subcutaneous Tissue Debridement oe with a total area of 0.22 sq cm performed by Duanne Guess, MD. With the following instrument(s): Curette to remove Non-Viable tissue/material. Material removed includes Eschar, Subcutaneous Tissue, and Slough after achieving pain control using Lidocaine 5% topical ointment. No specimens were taken. A time out was conducted at 15:20, prior to the start of the procedure. A Minimum amount of bleeding was controlled with Pressure. The procedure was tolerated well. Post Debridement Measurements: 0.4cm length x 0.7cm  width x 0.3cm depth; 0.066cm^3 volume. Post debridement Stage noted as Category/Stage IV. Character of Wound/Ulcer Post Debridement is improved. 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 There was a Excisional Skin/Subcutaneous Tissue Debridement with a total area of 0.94 sq cm performed by Duanne Guess, MD. With the following instrument(s): Curette to remove Non-Viable tissue/material. Material removed includes Eschar, Subcutaneous Tissue, and Slough after achieving pain control using Lidocaine 5% topical ointment. No specimens were taken. A time out was conducted at 15:20, prior to the start of the procedure. A Minimum amount of bleeding was controlled with Pressure. The procedure was tolerated well. Post Debridement Measurements: 0.8cm length x 1.5cm width x 0.2cm depth; 0.188cm^3 volume. Character of Wound/Ulcer Post Debridement is improved. Severity of Tissue Post Debridement is: Fat layer exposed. Post procedure Diagnosis Wound #13: Same as Pre-Procedure Wound #9 Pre-procedure diagnosis of Wound #9 is a Pressure Ulcer located on the Left Calcaneus . There was a Excisional Skin/Subcutaneous Tissue Debridement with a total area of 0.07 sq cm performed by Duanne Guess, MD. With the following instrument(s): Curette to remove Non-Viable tissue/material. Material removed includes Eschar, Subcutaneous Tissue, and Slough after achieving pain control using Lidocaine 5% topical ointment. No specimens were taken. A time out was conducted at 15:20, prior to the start of the procedure. A Minimum amount of bleeding was controlled with Pressure. The procedure was tolerated well. Post Debridement Measurements: 0.3cm length x 0.3cm width x 0.3cm depth; 0.021cm^3 volume. Post debridement Stage noted as  Category/Stage III. Character of Wound/Ulcer Post Debridement is improved. Post procedure Diagnosis Wound #9: Same as Pre-Procedure Plan Follow-up Appointments: Return Appointment in 2 weeks. - Dr. Lady Gary 10/07/23 at 2:45pm Anesthetic: (In  clinic) Topical Lidocaine 4% applied to wound bed Bathing/ Shower/ Hygiene: May shower and wash wound with soap and water. Off-Loading: Other: - float heels with pillows under calves Additional Orders / Instructions: Follow Nutritious Diet - add in protein shakes every day to diet - recommend premier protein 500 mg x3 a day vitamin C, zinc 30-50 mg per day Home Health: No change in wound care orders this week; continue Home Health for wound care. May utilize formulary equivalent dressing for wound treatment orders unless otherwise specified. Other Home Health Orders/Instructions: - Medi Home The following medication(s) was prescribed: lidocaine topical 5 % ointment ointment topical once daily was prescribed at facility WOUND #10: - T Second Wound Laterality: Left oe Cleanser: Soap and Water 1 x Per Day/30 Days Discharge Instructions: May shower and wash wound with dial antibacterial soap and water prior to dressing change. Cleanser: Wound Cleanser 1 x Per Day/30 Days Discharge Instructions: Cleanse the wound with wound cleanser prior to applying a clean dressing using gauze sponges, not tissue or cotton balls. Peri-Wound Care: Sween Lotion (Moisturizing lotion) 1 x Per Day/30 Days Discharge Instructions: Apply moisturizing lotion as directed Prim Dressing: Promogran Prisma Matrix, 4.34 (sq in) (silver collagen) (Dispense As Written) 1 x Per Day/30 Days ary Discharge Instructions: Moisten collagen with saline or hydrogel Secondary Dressing: Woven Gauze Sponge, Non-Sterile 4x4 in (Generic) 1 x Per Day/30 Days Discharge Instructions: Apply over primary dressing as directed. Secured With: [employer], 4.5x3.1 (in/yd) (Generic) 1 x Per Day/30  Days Discharge Instructions: Secure with Kerlix as directed. Secured With: 34M Medipore H Soft Cloth Surgical T ape, 4 x 10 (in/yd) (Generic) 1 x Per Day/30 Days Discharge Instructions: Secure with tape as directed. WOUND #11: - T Second Wound Laterality: Right oe Cleanser: Soap and Water 1 x Per Day/30 Days Discharge Instructions: May shower and wash wound with dial antibacterial soap and water prior to dressing change. Cleanser: Wound Cleanser 1 x Per Day/30 Days Discharge Instructions: Cleanse the wound with wound cleanser prior to applying a clean dressing using gauze sponges, not tissue or cotton balls. [name], [name] ([id number]) [id number] Page 13 of 14 Prim Dressing: Promogran Prisma Matrix, 4.34 (sq in) (silver collagen) (Dispense As Written) 1 x Per Day/30 Days ary Discharge Instructions: Moisten collagen with saline or hydrogel Secondary Dressing: Woven Gauze Sponge, Non-Sterile 4x4 in (Generic) 1 x Per Day/30 Days Discharge Instructions: Apply over primary dressing as directed. Secured With: Insurance underwriter, Sterile 2x75 (in/in) (Generic) 1 x Per Day/30 Days Discharge Instructions: Secure with stretch gauze as directed. Secured With: 34M Medipore H Soft Cloth Surgical T ape, 4 x 10 (in/yd) (Generic) 1 x Per Day/30 Days Discharge Instructions: Secure with tape as directed. WOUND #13: - T Great Wound Laterality: Dorsal, Left oe Cleanser: Soap and Water 1 x Per Day/30 Days Discharge Instructions: May shower and wash wound with dial antibacterial soap and water prior to dressing change. Cleanser: Wound Cleanser 1 x Per Day/30 Days Discharge Instructions: Cleanse the wound with wound cleanser prior to applying a clean dressing using gauze sponges, not tissue or cotton balls. Prim Dressing: Promogran Prisma Matrix, 4.34 (sq in) (silver collagen) (Dispense As Written) 1 x Per Day/30 Days ary Discharge Instructions: Moisten collagen with  saline or hydrogel Secondary Dressing: Woven Gauze Sponge, Non-Sterile 4x4 in (Generic) 1 x Per Day/30 Days Discharge Instructions: Apply over primary dressing as directed. Secured With: 34M Medipore H Soft Cloth Surgical T ape, 4 x 10 (in/yd) (Generic) 1 x  Per Day/30 Days Discharge Instructions: Secure with tape as directed. WOUND #9: - Calcaneus Wound Laterality: Left Cleanser: Soap and Water 1 x Per Day/30 Days Discharge Instructions: May shower and wash wound with dial antibacterial soap and water prior to dressing change. Cleanser: Wound Cleanser 1 x Per Day/30 Days Discharge Instructions: Cleanse the wound with wound cleanser prior to applying a clean dressing using gauze sponges, not tissue or cotton balls. Prim Dressing: Promogran Prisma Matrix, 4.34 (sq in) (silver collagen) (Dispense As Written) 1 x Per Day/30 Days ary Discharge Instructions: Moisten collagen with saline or hydrogel Secondary Dressing: ALLEVYN Heel 4 1/2in x 5 1/2in / 10.5cm x 13.5cm (Generic) 1 x Per Day/30 Days Discharge Instructions: Apply over primary dressing as directed. Secondary Dressing: Woven Gauze Sponge, Non-Sterile 4x4 in (Generic) 1 x Per Day/30 Days Discharge Instructions: Apply over primary dressing as directed. Secured With: [employer], 4.5x3.1 (in/yd) (Generic) 1 x Per Day/30 Days Discharge Instructions: Secure with Kerlix as directed. Secured With: 28M Medipore H Soft Cloth Surgical T ape, 4 x 10 (in/yd) (Generic) 1 x Per Day/30 Days Discharge Instructions: Secure with tape as directed. 09/23/2023: The wound on her knee has reopened. It is more superficial than the previous occasion and only extends to the fat layer. The heel wound is a little bit smaller and shallower today. The wounds on her toes are unchanged. I used a curette to debride slough, eschar, and subcutaneous tissue from all of the open wounds, including the new opening on her knee. Will use Hydrofera Blue on the knee and  continue Prisma silver collagen on the toes and heel ulcer. Follow-up in 2 weeks. Electronic Signature(s) Signed: 09/23/2023 3:35:11 PM By: Duanne Guess MD FACS Entered By: Duanne Guess on 09/23/2023 15:35:11 -------------------------------------------------------------------------------- SuperBill Details Patient Name: Date of Service: [name] 09/23/2023 Medical Record Number: [id number] Patient Account Number: [id number] Date of Birth/Sex: Treating RN: 04-20-1928 (89 y.o. 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 without evidence of necrosis L97.822 Non-pressure chronic ulcer of other part of left lower leg with fat layer exposed I73.9 Peripheral vascular disease, unspecified I50.32 Chronic diastolic (congestive) heart failure N18.32 Chronic kidney disease, stage 3b I87.2 Venous insufficiency (chronic) (peripheral) Berry, Manjot [name] ([id number]) [id number] Page 14 of 14 Facility Procedures : CPT4 Code: [id number] Description: 11042 - DEB SUBQ TISSUE 20 SQ CM/< ICD-10 Diagnosis Description L89.623 Pressure ulcer of left heel, 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 wi L97.526 Non-pressure chronic ulcer of other  part of left foot with bone involvement wit L97.822 Non-pressure chronic ulcer of other part of left lower leg with fat layer expos Modifier: thout evidence of nec hout evidence of necr ed Quantity: 1 rosis osis Physician Procedures : CPT4 Code Description Modifier [id number] - WC  PHYS LEVEL 4 - EST PT ICD-10 Diagnosis Description L89.623 Pressure ulcer of left heel, 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 without evidence of  nec L97.822 Non-pressure chronic ulcer of other part of left lower leg with fat layer exposed L97.526 Non-pressure chronic ulcer of other part of left  foot with bone involvement without evidence of necr Quantity: 1 rosis osis : [id number] 11042 - WC PHYS SUBQ TISS 20 SQ CM ICD-10 Diagnosis Description L89.623 Pressure ulcer of left heel, 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

## 2023-09-28 NOTE — Telephone Encounter (Signed)
Risk/benefit of anticoagulation was discussed at length in hospital. Based on cardiology review recommend remain of anticoagulation. Can discuss with Dr. Cristal Deer at [facility] 10/09/23.   Alver Sorrow, NP

## 2023-09-28 NOTE — Progress Notes (Signed)
DEWAYNE, BURLAND ([id number]) [id number] Page 1 of 13 Visit Report for 09/23/2023 Arrival Information Details Patient Name: Date of Service: [name], [name] 09/23/2023 2:45 PM Medical Record Number: [id number] Patient Account Number: [id number] Date of Birth/Sex: Treating RN: March 18, 1928 (89 y.o. F) Primary Care Keshonda Monsour: [name] Other Clinician: Referring Latrise Bowland: Treating Dicky Boer/Extender: Tiajuana Amass in Treatment: 28 Visit Information History Since Last Visit Added or deleted any medications: No Patient Arrived: Wheel Chair Any new allergies or adverse reactions: No Arrival Time: 14:54 Had a fall or experienced change in No Accompanied By: daughter activities of daily living that may affect Transfer Assistance: None risk of falls: Patient Identification Verified: Yes Signs or symptoms of abuse/neglect since last visito No Secondary Verification Process Completed: Yes Hospitalized since last visit: No Patient Requires Transmission-Based Precautions: No Implantable device outside of the clinic excluding No Patient Has Alerts: Yes cellular tissue based products placed in the center Patient Alerts: ABI R: 0.48 L: 0.46 2/24 since last visit: TBI R: 0.39 L: 0.21 2/24 Pain Present Now: No Electronic Signature(s) Signed: 09/23/2023 3:52:18 PM By: Dayton Scrape Entered By: Dayton Scrape on 09/23/2023 14:54:57 -------------------------------------------------------------------------------- Encounter Discharge Information Details Patient Name: Date of Service: Megan Reap. 09/23/2023 2:45 PM Medical Record Number: [id number] Patient Account Number: [id number] Date of Birth/Sex: Treating RN: [birth year] (89 y.o. [name] Primary Care Asenath Balash: [name] Other Clinician: Referring Aldo Sondgeroth: Treating Kaito Schulenburg/Extender: Claudie Leach Weeks in Treatment: 28 Encounter Discharge Information Items Post  Procedure Vitals Discharge Condition: Stable Temperature (F): 98.2 Ambulatory Status: Ambulatory Pulse (bpm): 84 Discharge Destination: Home Respiratory Rate (breaths/min): 18 Transportation: Private Auto Blood Pressure (mmHg): 148/56 Accompanied By: daughter Schedule Follow-up Appointment: Yes Clinical Summary of Care: Patient Declined Electronic Signature(s) Signed: 09/23/2023 4:22:17 PM By: Redmond Pulling RN, BSN Entered By: Redmond Pulling on 09/23/2023 15:45:13 [name], [name] ([id number]) [id number] Page 2 of 13 -------------------------------------------------------------------------------- Lower Extremity Assessment Details Patient Name: Date of Service: [name], [name] 09/23/2023 2:45 PM Medical Record Number: [id number] Patient Account Number: [id number] Date of Birth/Sex: Treating RN: Dec 26, 1927 (89 y.o. [name] Primary Care Milayah Krell: [name] Other Clinician: Referring Rida Loudin: Treating Avenly Roberge/Extender: Claudie Leach Weeks in Treatment: 28 Edema Assessment Assessed: [Left: No] [Right: No] Edema: [Left: Yes] [Right: Yes] Calf Left: Right: Point of Measurement: From Medial Instep 37.5 cm 36 cm Ankle Left: Right: Point of Measurement: From Medial Instep 23 cm 23 cm Vascular Assessment Pulses: Dorsalis Pedis Palpable: [Left:Yes] [Right:Yes] Extremity colors, hair growth, and conditions: Extremity Color: [Left:Hyperpigmented] [Right:Hyperpigmented] Hair Growth on Extremity: [Left:No] [Right:No] Temperature of Extremity: [Left:Warm < 3 seconds] [Right:Warm < 3 seconds] Electronic Signature(s) Signed: 09/23/2023 4:22:17 PM By: Redmond Pulling RN, BSN Entered By: Redmond Pulling on 09/23/2023 15:16:10 -------------------------------------------------------------------------------- Multi Wound Chart Details Patient Name: Date of Service: Megan Reap. 09/23/2023 2:45 PM Medical Record Number:  [id number] Patient Account Number: [id number] Date of Birth/Sex: Treating RN: 12-16-1927 (89 y.o. F) Primary Care Kemar Pandit: [name] Other Clinician: Referring Jacksyn Beeks: Treating Junnie Loschiavo/Extender: Claudie Leach Weeks in Treatment: 28 Vital Signs Height(in): 64 Pulse(bpm): 84 Weight(lbs): 157 Blood Pressure(mmHg): 148/56 Body Mass Index(BMI): 26.9 Temperature(F): 98.2 Respiratory Rate(breaths/min): 18 [10:Photos:] [[id number] Page 3 of 13] Left T Second oe Right T Second oe Left, Dorsal T Great oe Wound Location: Pressure Injury Pressure Injury Gradually Appeared Wounding Event: Pressure Ulcer Pressure Ulcer Arterial Insufficiency Ulcer Primary Etiology: Cataracts, Anemia, Arrhythmia, Cataracts, Anemia, Arrhythmia, Cataracts, Anemia, Arrhythmia, Comorbid History: Congestive Heart Failure,  Congestive Heart Failure, Congestive Heart Failure, Hypotension, Peripheral Venous Hypotension, Peripheral Venous Hypotension, Peripheral Venous Disease, Osteomyelitis, Neuropathy Disease, Osteomyelitis, Neuropathy Disease, Osteomyelitis, Neuropathy 10/09/2022 10/09/2022 03/24/2023 Date Acquired: 28 28 25  Weeks of Treatment: Open Open Open Wound Status: No No No Wound Recurrence: 0.4x0.9x0.3 0.4x0.7x0.3 0.8x1.5x0.2 Measurements L x W x D (cm) 0.283 0.22 0.942 A (cm) : rea 0.085 0.066 0.188 Volume (cm) : 60.00% 68.90% -1092.40% % Reduction in A rea: -19.70% 7.00% -2250.00% % Reduction in Volume: Category/Stage IV Category/Stage IV Full Thickness With Exposed Support Classification: Structures Medium Medium Medium Exudate A mount: Serous Serous Serous Exudate Type: amber amber amber Exudate Color: Distinct, outline attached Distinct, outline attached Distinct, outline attached Wound Margin: Small (1-33%) Medium (34-66%) Small (1-33%) Granulation A mount: Red, Pink Red, Pink Red, Pink Granulation Quality: Large (67-100%) Small  (1-33%) Large (67-100%) Necrotic A mount: Fat Layer (Subcutaneous Tissue): Yes Fat Layer (Subcutaneous Tissue): Yes Fat Layer (Subcutaneous Tissue): Yes Exposed Structures: Joint: Yes Joint: Yes Bone: Yes Bone: Yes Bone: Yes Fascia: No Fascia: No Fascia: No Tendon: No Tendon: No Tendon: No Muscle: No Muscle: No Muscle: No Joint: No Small (1-33%) Small (1-33%) Small (1-33%) Epithelialization: Debridement - Excisional Debridement - Excisional Debridement - Excisional Debridement: Pre-procedure Verification/Time Out 15:20 15:20 15:20 Taken: Lidocaine 5% topical ointment Lidocaine 5% topical ointment Lidocaine 5% topical ointment Pain Control: Necrotic/Eschar, Subcutaneous, Necrotic/Eschar, Subcutaneous, Necrotic/Eschar, Subcutaneous, Tissue Debrided: [employer] Skin/Subcutaneous Tissue Skin/Subcutaneous Tissue Skin/Subcutaneous Tissue Level: 0.28 0.22 0.94 Debridement A (sq cm): rea Curette Curette Curette Instrument: Minimum Minimum Minimum Bleeding: Pressure Pressure Pressure Hemostasis Achieved: Debridement Treatment Response: Procedure was tolerated well Procedure was tolerated well Procedure was tolerated well Post Debridement Measurements L x 0.4x0.9x0.2 0.4x0.7x0.3 0.8x1.5x0.2 W x D (cm) 0.057 0.066 0.188 Post Debridement Volume: (cm) Category/Stage IV Category/Stage IV N/A Post Debridement Stage: No Abnormalities Noted No Abnormalities Noted No Abnormalities Noted Periwound Skin Texture: Dry/Scaly: Yes Dry/Scaly: Yes No Abnormalities Noted Periwound Skin Moisture: No Abnormalities Noted Rubor: No Erythema: No Periwound Skin Color: No Abnormality No Abnormality No Abnormality Temperature: Yes Yes Yes Tenderness on Palpation: Debridement Debridement Debridement Procedures Performed: Wound Number: 9 N/A N/A Photos: N/A N/A Left Calcaneus N/A N/A Wound Location: Pressure Injury N/A N/A Wounding Event: Pressure Ulcer N/A N/A Primary  Etiology: Cataracts, Anemia, Arrhythmia, N/A N/A Comorbid History: Congestive Heart Failure, Hypotension, Peripheral Venous Disease, Osteomyelitis, Neuropathy 10/22/2022 N/A N/A Date Acquired: 73 N/A N/A Weeks of Treatment: Open N/A N/A Wound Status: No N/A N/A Wound Recurrence: 0.3x0.3x0.3 N/A N/A Measurements L x W x D (cm) 0.071 N/A N/A A (cm) : [name], [name] ([id number]) [id number] Page 4 of 13 0.021 N/A N/A Volume (cm) : 78.50% N/A N/A % Reduction in A rea: 78.80% N/A N/A % Reduction in Volume: Category/Stage III N/A N/A Classification: Small N/A N/A Exudate A mount: Serous N/A N/A Exudate Type: amber N/A N/A Exudate Color: Distinct, outline attached N/A N/A Wound Margin: Medium (34-66%) N/A N/A Granulation A mount: Red N/A N/A Granulation Quality: Medium (34-66%) N/A N/A Necrotic A mount: Fat Layer (Subcutaneous Tissue): Yes N/A N/A Exposed Structures: Fascia: No Tendon: No Muscle: No Joint: No Bone: No Small (1-33%) N/A N/A Epithelialization: Debridement - Excisional N/A N/A Debridement: Pre-procedure Verification/Time Out 15:20 N/A N/A Taken: Lidocaine 5% topical ointment N/A N/A Pain Control: Necrotic/Eschar, Subcutaneous, N/A N/A Tissue Debrided: Slough Skin/Subcutaneous Tissue N/A N/A Level: 0.07 N/A N/A Debridement A (sq cm): rea Curette N/A N/A Instrument: Minimum N/A N/A Bleeding: Pressure N/A N/A Hemostasis Achieved:  Debridement Treatment Response: Procedure was tolerated well N/A N/A Post Debridement Measurements L x 0.3x0.3x0.3 N/A N/A W x D (cm) 0.021 N/A N/A Post Debridement Volume: (cm) Category/Stage III N/A N/A Post Debridement Stage: Callus: Yes N/A N/A Periwound Skin Texture: Dry/Scaly: Yes N/A N/A Periwound Skin Moisture: No Abnormalities Noted N/A N/A Periwound Skin Color: No Abnormality N/A N/A Temperature: Yes N/A N/A Tenderness on Palpation: Debridement N/A  N/A Procedures Performed: Treatment Notes Electronic Signature(s) Signed: 09/23/2023 3:30:18 PM By: Duanne Guess MD FACS Entered By: Duanne Guess on 09/23/2023 15:30:18 -------------------------------------------------------------------------------- Multi-Disciplinary Care Plan Details Patient Name: Date of Service: Megan Reap. 09/23/2023 2:45 PM Medical Record Number: [id number] Patient Account Number: [id number] Date of Birth/Sex: Treating RN: [birth year] (89 y.o. [name] Primary Care Keylon Labelle: [name] Other Clinician: Referring Hatley Henegar: Treating Karletta Millay/Extender: Tiajuana Amass in Treatment: 28 Multidisciplinary Care Plan reviewed with physician Active Inactive Abuse / Safety / Falls / Self Care Management Nursing Diagnoses: Impaired home maintenance Impaired physical mobility Potential for falls Goals: Patient/caregiver will verbalize/demonstrate measure taken to improve self care NAQUESHA, KASCHAK ([id number]) [phone].pdf Page 5 of 13 Date Initiated: 03/10/2023 Target Resolution Date: 11/06/2023 Goal Status: Active Patient/caregiver will verbalize/demonstrate measures taken to improve the patient's personal safety Date Initiated: 03/10/2023 Target Resolution Date: 11/06/2023 Goal Status: Active Interventions: Assess fall risk on admission and as needed Assess: immobility, friction, shearing, incontinence upon admission and as needed Provide education on fall prevention Notes: pt fell 06/18/23 injuring left knee Wound/Skin Impairment Nursing Diagnoses: Impaired tissue integrity Knowledge deficit related to ulceration/compromised skin integrity Goals: Patient/caregiver will verbalize understanding of skin care regimen Date Initiated: 03/10/2023 Target Resolution Date: 11/06/2023 Goal Status: Active Interventions: Assess patient/caregiver ability to obtain necessary supplies Assess patient/caregiver  ability to perform ulcer/skin care regimen upon admission and as needed Assess ulceration(s) every visit Treatment Activities: Skin care regimen initiated : 03/10/2023 Topical wound management initiated : 03/10/2023 Notes: Electronic Signature(s) Signed: 09/23/2023 4:22:17 PM By: Redmond Pulling RN, BSN Entered By: Redmond Pulling on 09/23/2023 15:43:33 -------------------------------------------------------------------------------- Pain Assessment Details Patient Name: Date of Service: Megan Reap. 09/23/2023 2:45 PM Medical Record Number: [id number] Patient Account Number: [id number] Date of Birth/Sex: Treating RN: 1927-09-16 (89 y.o. F) Primary Care Neiman Roots: [name] Other Clinician: Referring Nikkita Adeyemi: Treating Nayab Aten/Extender: Claudie Leach Weeks in Treatment: 28 Active Problems Location of Pain Severity and Description of Pain Patient Has Paino No Site Locations [name], [name] ([id number]) [id number] Page 6 of 13 Pain Management and Medication Current Pain Management: Electronic Signature(s) Signed: 09/23/2023 3:52:18 PM By: Dayton Scrape Entered By: Dayton Scrape on 09/23/2023 14:55:33 -------------------------------------------------------------------------------- Patient/Caregiver Education Details Patient Name: Date of Service: Megan Reap 1/15/2025andnbsp2:45 PM Medical Record Number: [id number] Patient Account Number: [id number] Date of Birth/Gender: Treating RN: November 09, 1927 (89 y.o. [name] Primary Care Physician: [name] Other Clinician: Referring Physician: Treating Physician/Extender: Tiajuana Amass in Treatment: 28 Education Assessment Education Provided To: Patient Education Topics Provided Wound/Skin Impairment: Methods: Explain/Verbal Responses: State content correctly [employer]) Signed: 09/23/2023 4:22:17 PM By: Redmond Pulling RN, BSN Entered By: Redmond Pulling on 09/23/2023 15:43:48 -------------------------------------------------------------------------------- Wound Assessment Details Patient Name: Date of Service: Megan Reap. 09/23/2023 2:45 PM Medical Record Number: [id number] Patient Account Number: [id number] Date of Birth/Sex: Treating RN: 02/05/1928 (89 y.o. F) Primary Care Katianna Mcclenney: [name] Other Clinician: Referring Fallon Haecker: Treating Abrham Maslowski/Extender: Claudie Leach [place], [place] Judie Petit ([id number]) [id number] Page 7 of 13 Weeks in Treatment: 28  Wound Status Wound Number: 10 Primary Pressure Ulcer Etiology: Wound Location: Left T Second oe Wound Open Wounding Event: Pressure Injury Status: Date Acquired: 10/09/2022 Comorbid Cataracts, Anemia, Arrhythmia, Congestive Heart Failure, Weeks Of Treatment: 28 History: Hypotension, Peripheral Venous Disease, Osteomyelitis, Clustered Wound: No Neuropathy Photos Wound Measurements Length: (cm) 0.4 Width: (cm) 0.9 Depth: (cm) 0.3 Area: (cm) 0.283 Volume: (cm) 0.085 % Reduction in Area: 60% % Reduction in Volume: -19.7% Epithelialization: Small (1-33%) Wound Description Classification: Category/Stage IV Wound Margin: Distinct, outline attached Exudate Amount: Medium Exudate Type: Serous Exudate Color: amber Foul Odor After Cleansing: No Slough/Fibrino No Wound Bed Granulation Amount: Small (1-33%) Exposed Structure Granulation Quality: Red, Pink Fascia Exposed: No Necrotic Amount: Large (67-100%) Fat Layer (Subcutaneous Tissue) Exposed: Yes Necrotic Quality: Adherent Slough Tendon Exposed: No Muscle Exposed: No Joint Exposed: Yes Bone Exposed: Yes Periwound Skin Texture Texture Color No Abnormalities Noted: Yes No Abnormalities Noted: Yes Moisture Temperature / Pain No Abnormalities Noted: Yes Temperature: No Abnormality Tenderness on Palpation: Yes Treatment Notes Wound #10 (Toe Second) Wound Laterality:  Left Cleanser Soap and Water Discharge Instruction: May shower and wash wound with dial antibacterial soap and water prior to dressing change. Wound Cleanser Discharge Instruction: Cleanse the wound with wound cleanser prior to applying a clean dressing using gauze sponges, not tissue or cotton balls. Peri-Wound Care Sween Lotion (Moisturizing lotion) Discharge Instruction: Apply moisturizing lotion as directed Topical Primary Dressing Promogran Prisma Matrix, 4.34 (sq in) (silver collagen) Bhattacharyya, Lalisa [name] ([id number]) [id number] Page 8 of 13 Discharge Instruction: Moisten collagen with saline or hydrogel Secondary Dressing Woven Gauze Sponge, Non-Sterile 4x4 in Discharge Instruction: Apply over primary dressing as directed. Secured With [employer], 4.5x3.1 (in/yd) Discharge Instruction: Secure with Kerlix as directed. 72M Medipore H Soft Cloth Surgical T ape, 4 x 10 (in/yd) Discharge Instruction: Secure with tape as directed. Compression Wrap Compression Stockings Add-Ons Electronic Signature(s) Signed: 09/28/2023 3:17:34 PM By: Karl Ito Entered By: Karl Ito on 09/23/2023 15:01:25 -------------------------------------------------------------------------------- Wound Assessment Details Patient Name: Date of Service: Megan Reap. 09/23/2023 2:45 PM Medical Record Number: [id number] Patient Account Number: [id number] Date of Birth/Sex: Treating RN: 12/10/1927 (89 y.o. F) Primary Care Kameshia Madruga: [name] Other Clinician: Referring Geovannie Vilar: Treating Amariyah Bazar/Extender: Claudie Leach Weeks in Treatment: 28 Wound Status Wound Number: 11 Primary Pressure Ulcer Etiology: Wound Location: Right T Second oe Wound Open Wounding Event: Pressure Injury Status: Date Acquired: 10/09/2022 Comorbid Cataracts, Anemia, Arrhythmia, Congestive Heart Failure, Weeks Of Treatment: 28 History: Hypotension, Peripheral Venous  Disease, Osteomyelitis, Clustered Wound: No Neuropathy Photos Wound Measurements Length: (cm) 0.4 Width: (cm) 0.7 Depth: (cm) 0.3 Area: (cm) 0.22 Volume: (cm) 0.066 % Reduction in Area: 68.9% % Reduction in Volume: 7% Epithelialization: Small (1-33%) Wound Description Classification: Category/Stage IV Wound Margin: Distinct, outline attached Exudate Amount: Medium Exudate Type: Serous Exudate Color: amber Shoaff, Sidonia [name] ([id number]) Foul Odor After Cleansing: No Slough/Fibrino Yes [id number] Page 9 of 13 Wound Bed Granulation Amount: Medium (34-66%) Exposed Structure Granulation Quality: Red, Pink Fascia Exposed: No Necrotic Amount: Small (1-33%) Fat Layer (Subcutaneous Tissue) Exposed: Yes Necrotic Quality: Adherent Slough Tendon Exposed: No Muscle Exposed: No Joint Exposed: Yes Bone Exposed: Yes Periwound Skin Texture Texture Color No Abnormalities Noted: Yes No Abnormalities Noted: Yes Moisture Temperature / Pain No Abnormalities Noted: Yes Temperature: No Abnormality Tenderness on Palpation: Yes Treatment Notes Wound #11 (Toe Second) Wound Laterality: Right Cleanser Soap and Water Discharge Instruction: May shower and wash wound with dial antibacterial soap and water prior to  dressing change. Wound Cleanser Discharge Instruction: Cleanse the wound with wound cleanser prior to applying a clean dressing using gauze sponges, not tissue or cotton balls. Peri-Wound Care Topical Primary Dressing Promogran Prisma Matrix, 4.34 (sq in) (silver collagen) Discharge Instruction: Moisten collagen with saline or hydrogel Secondary Dressing Woven Gauze Sponge, Non-Sterile 4x4 in Discharge Instruction: Apply over primary dressing as directed. Secured With [employer] Stretch Gauze Bandage, Sterile 2x75 (in/in) Discharge Instruction: Secure with stretch gauze as directed. 57M Medipore H Soft Cloth Surgical T ape, 4 x 10 (in/yd) Discharge  Instruction: Secure with tape as directed. Compression Wrap Compression Stockings Add-Ons Electronic Signature(s) Signed: 09/28/2023 3:17:34 PM By: Karl Ito Entered By: Karl Ito on 09/23/2023 15:02:01 -------------------------------------------------------------------------------- Wound Assessment Details Patient Name: Date of Service: [name], [name] 09/23/2023 2:45 PM Medical Record Number: [id number] Patient Account Number: [id number] Date of Birth/Sex: Treating RN: 1928/02/15 (89 y.o. F) Primary Care Aaliah Jorgenson: [name] Other Clinician: Referring Stefannie Defeo: Treating Aiva Miskell/Extender: Claudie Leach Weeks in Treatment: 28 Wound Status Wound Number: 13 Primary Arterial Insufficiency Ulcer Etiology: [name], [name] ([id number]) [id number] Page 10 of 13 Etiology: Wound Location: Left, Dorsal T Great oe Wound Open Wounding Event: Gradually Appeared Status: Date Acquired: 03/24/2023 Comorbid Cataracts, Anemia, Arrhythmia, Congestive Heart Failure, Weeks Of Treatment: 25 History: Hypotension, Peripheral Venous Disease, Osteomyelitis, Clustered Wound: No Neuropathy Photos Wound Measurements Length: (cm) 0.8 Width: (cm) 1.5 Depth: (cm) 0.2 Area: (cm) 0.942 Volume: (cm) 0.188 % Reduction in Area: -1092.4% % Reduction in Volume: -2250% Epithelialization: Small (1-33%) Wound Description Classification: Full Thickness With Exposed Supp Wound Margin: Distinct, outline attached Exudate Amount: Medium Exudate Type: Serous Exudate Color: amber ort Structures Foul Odor After Cleansing: No Slough/Fibrino Yes Wound Bed Granulation Amount: Small (1-33%) Exposed Structure Granulation Quality: Red, Pink Fascia Exposed: No Necrotic Amount: Large (67-100%) Fat Layer (Subcutaneous Tissue) Exposed: Yes Necrotic Quality: Adherent Slough, Bone Tendon Exposed: No Muscle Exposed: No Joint Exposed: No Bone Exposed:  Yes Periwound Skin Texture Texture Color No Abnormalities Noted: Yes No Abnormalities Noted: Yes Moisture Temperature / Pain No Abnormalities Noted: Yes Temperature: No Abnormality Tenderness on Palpation: Yes Treatment Notes Wound #13 (Toe Great) Wound Laterality: Dorsal, Left Cleanser Soap and Water Discharge Instruction: May shower and wash wound with dial antibacterial soap and water prior to dressing change. Wound Cleanser Discharge Instruction: Cleanse the wound with wound cleanser prior to applying a clean dressing using gauze sponges, not tissue or cotton balls. Peri-Wound Care Topical Primary Dressing Promogran Prisma Matrix, 4.34 (sq in) (silver collagen) Discharge Instruction: Moisten collagen with saline or hydrogel Secondary Dressing Woven Gauze Sponge, Non-Sterile 4x4 in Discharge Instruction: Apply over primary dressing as directed. Secured With [name], [name] ([id number]) [id number] Page 11 of 13 57M Medipore H Soft Cloth Surgical T ape, 4 x 10 (in/yd) Discharge Instruction: Secure with tape as directed. Compression Wrap Compression Stockings Add-Ons Electronic Signature(s) Signed: 09/28/2023 3:17:34 PM By: Karl Ito Entered By: Karl Ito on 09/23/2023 15:02:44 -------------------------------------------------------------------------------- Wound Assessment Details Patient Name: Date of Service: Megan Reap. 09/23/2023 2:45 PM Medical Record Number: [id number] Patient Account Number: [id number] Date of Birth/Sex: Treating RN: 28-May-1928 (89 y.o. F) Primary Care Hillman Attig: [name] Other Clinician: Referring Kaleah Hagemeister: Treating Jaiah Weigel/Extender: Claudie Leach Weeks in Treatment: 28 Wound Status Wound Number: 9 Primary Pressure Ulcer Etiology: Wound Location: Left Calcaneus Wound Open Wounding Event: Pressure Injury Status: Date Acquired: 10/22/2022 Comorbid Cataracts, Anemia, Arrhythmia,  Congestive Heart Failure, Weeks Of Treatment: 28 History: Hypotension, Peripheral Venous Disease, Osteomyelitis,  Clustered Wound: No Neuropathy Photos Wound Measurements Length: (cm) 0.3 Width: (cm) 0.3 Depth: (cm) 0.3 Area: (cm) 0.071 Volume: (cm) 0.021 % Reduction in Area: 78.5% % Reduction in Volume: 78.8% Epithelialization: Small (1-33%) Wound Description Classification: Category/Stage III Wound Margin: Distinct, outline attached Exudate Amount: Small Exudate Type: Serous Exudate Color: amber Foul Odor After Cleansing: No Slough/Fibrino Yes Wound Bed Granulation Amount: Medium (34-66%) Exposed Structure Granulation Quality: Red Fascia Exposed: No Necrotic Amount: Medium (34-66%) Fat Layer (Subcutaneous Tissue) Exposed: Yes Necrotic Quality: Adherent Slough Tendon Exposed: No Muscle Exposed: No Joint Exposed: No Bone Exposed: No [name], [name] ([id number]) [id number] Page 12 of 13 Periwound Skin Texture Texture Color No Abnormalities Noted: No No Abnormalities Noted: Yes Callus: Yes Temperature / Pain Temperature: No Abnormality Moisture No Abnormalities Noted: Yes Tenderness on Palpation: Yes Treatment Notes Wound #9 (Calcaneus) Wound Laterality: Left Cleanser Soap and Water Discharge Instruction: May shower and wash wound with dial antibacterial soap and water prior to dressing change. Wound Cleanser Discharge Instruction: Cleanse the wound with wound cleanser prior to applying a clean dressing using gauze sponges, not tissue or cotton balls. Peri-Wound Care Topical Primary Dressing Promogran Prisma Matrix, 4.34 (sq in) (silver collagen) Discharge Instruction: Moisten collagen with saline or hydrogel Secondary Dressing ALLEVYN Heel 4 1/2in x 5 1/2in / 10.5cm x 13.5cm Discharge Instruction: Apply over primary dressing as directed. Woven Gauze Sponge, Non-Sterile 4x4 in Discharge Instruction: Apply over primary dressing as  directed. Secured With [employer], 4.5x3.1 (in/yd) Discharge Instruction: Secure with Kerlix as directed. 73M Medipore H Soft Cloth Surgical T ape, 4 x 10 (in/yd) Discharge Instruction: Secure with tape as directed. Compression Wrap Compression Stockings Add-Ons Electronic Signature(s) Signed: 09/28/2023 3:17:34 PM By: Karl Ito Entered By: Karl Ito on 09/23/2023 15:03:16 -------------------------------------------------------------------------------- Vitals Details Patient Name: Date of Service: Megan Reap. 09/23/2023 2:45 PM Medical Record Number: [id number] Patient Account Number: [id number] Date of Birth/Sex: Treating RN: 1928-06-19 (89 y.o. F) Primary Care Saaya Procell: [name] Other Clinician: Referring Alvin Diffee: Treating Maitri Schnoebelen/Extender: Claudie Leach Weeks in Treatment: 28 Vital Signs Time Taken: 02:54 Temperature (F): 98.2 Height (in): 64 Pulse (bpm): 84 Weight (lbs): 157 Respiratory Rate (breaths/min): 18 Body Mass Index (BMI): 26.9 Blood Pressure (mmHg): 148/56 Reference Range: 80 - 120 mg / dl Electronic Signature(s) [name], [name] ([id number]) [id number] Page 13 of 13 Signed: 09/23/2023 3:52:18 PM By: Dayton Scrape Entered By: Dayton Scrape on 09/23/2023 14:55:27

## 2023-09-30 ENCOUNTER — Other Ambulatory Visit (HOSPITAL_BASED_OUTPATIENT_CLINIC_OR_DEPARTMENT_OTHER): Payer: Self-pay | Admitting: Family

## 2023-09-30 ENCOUNTER — Other Ambulatory Visit (INDEPENDENT_AMBULATORY_CARE_PROVIDER_SITE_OTHER): Payer: Self-pay | Admitting: Ophthalmology

## 2023-10-07 ENCOUNTER — Encounter (HOSPITAL_BASED_OUTPATIENT_CLINIC_OR_DEPARTMENT_OTHER): Payer: Medicare HMO | Admitting: General Surgery

## 2023-10-07 DIAGNOSIS — L97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 without evidence of necrosis: Secondary | ICD-10-CM | POA: Diagnosis not present

## 2023-10-09 ENCOUNTER — Encounter (HOSPITAL_BASED_OUTPATIENT_CLINIC_OR_DEPARTMENT_OTHER): Payer: Self-pay | Admitting: Cardiology

## 2023-10-09 ENCOUNTER — Telehealth (HOSPITAL_BASED_OUTPATIENT_CLINIC_OR_DEPARTMENT_OTHER): Payer: Self-pay | Admitting: Licensed Clinical Social Worker

## 2023-10-09 ENCOUNTER — Telehealth (HOSPITAL_BASED_OUTPATIENT_CLINIC_OR_DEPARTMENT_OTHER): Payer: Medicare HMO | Admitting: Cardiology

## 2023-10-09 VITALS — BP 133/58 | HR 67 | Ht 63.0 in | Wt 156.0 lb

## 2023-10-09 DIAGNOSIS — D500 Iron deficiency anemia secondary to blood loss (chronic): Secondary | ICD-10-CM | POA: Diagnosis not present

## 2023-10-09 DIAGNOSIS — I272 Pulmonary hypertension, unspecified: Secondary | ICD-10-CM | POA: Diagnosis not present

## 2023-10-09 DIAGNOSIS — I4821 Permanent atrial fibrillation: Secondary | ICD-10-CM | POA: Diagnosis not present

## 2023-10-09 DIAGNOSIS — Z7189 Other specified counseling: Secondary | ICD-10-CM

## 2023-10-09 DIAGNOSIS — I739 Peripheral vascular disease, unspecified: Secondary | ICD-10-CM

## 2023-10-09 DIAGNOSIS — I350 Nonrheumatic aortic (valve) stenosis: Secondary | ICD-10-CM

## 2023-10-09 DIAGNOSIS — Z09 Encounter for follow-up examination after completed treatment for conditions other than malignant neoplasm: Secondary | ICD-10-CM | POA: Diagnosis not present

## 2023-10-09 DIAGNOSIS — I5032 Chronic diastolic (congestive) heart failure: Secondary | ICD-10-CM

## 2023-10-09 NOTE — Patient Instructions (Addendum)
Medication Instructions:  Your physician recommends that you continue on your current medications as directed. Please refer to the Current Medication list given to you today.  *If you need a refill on your cardiac medications before your next appointment, please call your pharmacy*   Follow-Up: At [facility], you and your health needs are our priority.  As part of our continuing mission to provide you with exceptional heart care, we have created designated Provider Care Teams.  These Care Teams include your primary Cardiologist (physician) and Advanced Practice Providers (APPs -  Physician Assistants and Nurse Practitioners) who all work together to provide you with the care you need, when you need it.  We recommend signing up for the patient portal called "MyChart".  Sign up information is provided on this After Visit Summary.  MyChart is used to connect with patients for Virtual Visits (Telemedicine).  Patients are able to view lab/test results, encounter notes, upcoming appointments, etc.  Non-urgent messages can be sent to your provider as well.   To learn more about what you can do with MyChart, go to [email or web address].    Your next appointment:   2 month(s) virtual visit  Provider:   [name], MD    Other Instructions Social worker will reach out with information on living will and advance directives.

## 2023-10-09 NOTE — Progress Notes (Signed)
Heart and Vascular Care Navigation  10/09/2023  [name] 05/15/1928 [id number]  Reason for Referral: advanced care planning Patient is participating in a Managed Medicaid Plan: no, Aetna Medicare only  Engaged with pt caregiver (daughter [name]) for initial visit for Heart and Vascular Care Coordination.                                                                                                   Assessment:               LCSW received referral for pt to assist with some ongoing care planning support for pt daughter. Called and reached pt daughter [name] at [phone]. Introduced self, role, reason for call. Confirmed DOB and pt address. Pt daughter completes assessment on behalf of pt. She is pt main caregiver and is very involved in the role, she has assistance from an in home aide/nurse. She denies any issues at this time with access to or affordability of housing, utilities, food or medications. They have access to transportation for pt.   At this time we discussed that Dr. Cristal [name] wants to make sure care decisions for pt are in line with what the pt would want and take into consideration the impact of pt multiple chronic health needs. Pt daughter confirms pt has DNR form and has completed a power of attorney document with legal assistance. Concern is mostly around knowing what options she should and shouldn't ask for for her mother should an emergency occur. Discussed MOST form and HCPOA contain specific questions regarding tube feeding and hydration etc that she may not have considered. I explained that she and her mother can go through these questions just the two of them and if needed that our team and PCP team is available to answer additional concerns that may arise after reviewing these. Encouraged pt daughter to bring our office a copy of DNR and these forms should they complete them [name] we can have them on file.   Provided verbal support for pt daughters role as caregiver  and that these decisions and questions aren't always easy to answer and decide and thanked her for taking the time to be thoughtful with completing them and considering medical team recommendations.   Encouraged her to call me with any additional questions/concerns that may arise. Will place these documents in the mail to home address.                        HRT/VAS Care Coordination     Patients Home Cardiology Office --  [facility]   Outpatient Care Team Social Worker   Social Worker Name: Octavio Graves, [place], [phone]   Living arrangements for the past 2 months Single Family Home   Lives with: Adult Children   Patient Current Insurance Coverage Managed Medicare   Patient Has Concern With Paying Medical Bills No   Does Patient Have Prescription Coverage? Yes   Home Assistive Devices/Equipment Walker (specify type); Wheelchair   HH Agency Other - See comment  P active with [facility] and wants to continue  Current home services DME       Social History:                                                                               SDOH Screenings   Food Insecurity: No Food Insecurity (10/09/2023)  Housing: Low Risk  (10/09/2023)  Transportation Needs: No Transportation Needs (10/09/2023)  Utilities: Not At Risk (10/09/2023)  Financial Resource Strain: Low Risk  (10/09/2023)  Tobacco Use: Medium Risk (10/09/2023)  Health Literacy: Inadequate Health Literacy (10/09/2023)     Follow-up plan:   LCSW has mailed pt daughter my card, MOST form and HCPOA/living will packet for review. Encouraged her to review it with pt and that our team at [facility] or PCP is available to discuss verbiage or anything else that may cause hesitations or confusion to complete it. Will f/u to ensure paperwork received.

## 2023-10-09 NOTE — Progress Notes (Signed)
Virtual Visit via Telephone Note   Because of Charmain M Dibuono's co-morbid illnesses, she is at least at moderate risk for complications without adequate follow up.  This format is felt to be most appropriate for this patient at this time.  The patient did not have access to video technology/had technical difficulties with video requiring transitioning to audio format only (telephone).  All issues noted in this document were discussed and addressed.  No physical exam could be performed with this format.  Please refer to the patient's chart for her consent to telehealth for [facility].   The patient was identified using 2 identifiers.  Patient Location: Home Provider Location: Office/Clinic   Date:  10/09/2023   ID:  ANAJA MONTS, DOB [birth year], MRN [id number]  PCP:  Camie Patience, FNP  Cardiologist:  [name], MD PhD  Referring MD: Camie Patience, FNP   CC: follow up  History of Present Illness: .    ADELHEID HOGGARD is a 89 y.o. female with a hx of atrial fibrillation now off coumadin due to anemia/concern for GI bleeding, essential hypertension, pulmonary hypertension, low flow low gradient aortic stenosis, PAD with wound, and chronic diastolic heart failure, here for follow up. Her initial consult (telemedicine) was 11/25/18.    Cardiac history: Previously followed by Dr. Orvis Brill at [facility] (notes under media tab). Per notes:  -echo 2014, lexiscan 2015 low risk,  -echo 2019 hyperdynamic EF, septal flattening, severe RV dilation, RVSP noted as critical (near systemic) pulmonary hypertension, severe biatrial enlargement, dilated coronary sinus ?persistent left SVC.  -Venous study 2019 with bilateral mild insufficiency -Echo 11/10/17 with hyperdynamic EF, septal flattening, severe RV dilation, severe TR, severe pulmonary hypertension (listed at 71 mmHg in note) -Echo 12/19/22 with EF 65-70%, RVSP 68.6, mod-severe TR, moderate low flow low gradient aortic  stenosis (prior echo 10/25/22 noted severe low flow low gradient but there was contamination from TR) -s/p peripheral angiography and angioplasty 03/04/23 to left SFA, popliteal, peroneal arteries -Seen by Dr. Karin Lieu 05/15/23. Severely edematous, requiring O2 at the visit.[name] Kitchen Recommended medical management, also discussed recommendation for hospice. Would consider repeat angiogram if wounds worsen.  Today,  Daughter is primary historian. Getting home nursing care. Feels that patient is gradually improving from her hospitalization in December. Has last had transfusion while hospitalized. Has been off coumadin since that hospitalization in December, restarted aspirin January 1st.  We reviewed her anemia/blood loss. Reviewed workup. Reviewed GI recommendation that she remain off coumadin; GI did not recommend procedure during hospitalization. Given her AS, severe pulmonary hypertension, she would be high risk for sedation from a cardiac perspective.  Her primary care doctor has been following her labs closely.  Reports toe wounds are about the same. Following closely with wound care. Reports that LE edema is stable. Just finished antibiotic for UTI.  ROS: ROS otherwise negative except as noted.   Studies Reviewed: [name] Kitchen    EKG:     (virtual visit, not ordered)  Physical Exam:   VS:  BP (!) 133/58 (BP Location: Left Arm, Patient Position: Sitting)   Pulse 67   Ht 5\' 3"  (1.6 m)   Wt 156 lb (70.8 kg)   SpO2 100%   BMI 27.63 kg/m    Wt Readings from Last 3 Encounters:  10/09/23 156 lb (70.8 kg)  08/27/23 154 lb 5.2 oz (70 kg)  07/16/23 156 lb (70.8 kg)    No distress Vitals reviewed Per report, minimal LE edema  and stable foot wounds  ASSESSMENT AND PLAN: .    Anemia Recent hospitalization, concern for GI bleed -we discussed anemia, coumadin, risk of bleeding vs. Risk of stroke -we discussed that she is very high risk for any procedure, including EGD/colonoscopy, given her aortic stenosis and  severe pulmonary hypertension. I discussed with the daughter that I would only do procedures in life or death situations, as the risk of complications are otherwise prohibitive -discussed that aspirin alone does not lower risk of stroke in afib, so we do now have that risk, but with the risk of life threatening anemia, there is no good answer -after shared decision making, would stop anticoagulation permanently. Did discuss that this means her risk of stroke with afib is elevated.  Goals of care: We have discussed extensively on multiple occasions. Daughter declines hospice as she does not want to decrease current services patient is receiving. Patient is DNR, asked her to bring form to next visit so we can upload into our system. We discussed advanced directives. Will reach out to our social worker Rutherford Nail to see if we can assist with this.  Chronic issues, discussed and unchanged:  Low flow low gradient aortic stenosis -initially reported as severe, then re-evaluated and noted as moderate -With her comorbidities, unclear that she would be able to tolerate TAVR and she could not tolerate open AVR. They agree and do not wish to pursue at this time.  Peripheral edema 2/2 severe pulmonary hypertension and chronic diastolic heart failure Hyponatremia, history of -continue bumex, spironolactone -continue daily weights, fluid restriction -we have extensively discussed her pulmonary hypertension, see prior notes  Lower extremity wounds PAD -following with Dr. Edilia Bo and Dr. Karin Lieu -nonhealing lower extremity wounds, s/p peripheral angiogram. recently treated for infection, followed by wound care  Hypertension: -continue diuretics as above  Atrial fibrillation, permanent Slow ventricular response -CHA2DS2/VAS=at least 5 -on coumadin, managed per PCP.   Plan for follow up: 8 weeks, virtual ok  Total time of encounter: I spent 54 minutes dedicated to the care of this patient on the date of  this encounter to include pre-visit review of records, face-to-face time with the patient discussing conditions above, and clinical documentation with the electronic health record. We specifically spent time today discussing risk of bleeding on coumadin, overall high risk cardiovascular disease, advanced directives/goals of care.   Signed, [name], MD   [name], MD, PhD, [facility]  [facility]  [place]  [facility] & Vascular at [facility] at [facility] [address], [address] [place], [place] [address] [phone]

## 2023-10-09 NOTE — Telephone Encounter (Signed)
  Patient Consent for Virtual Visit        [name] has provided verbal consent on 10/09/2023 for a virtual visit (video or telephone).   CONSENT FOR VIRTUAL VISIT FOR:  [name]  By participating in this virtual visit I agree to the following:  I hereby voluntarily request, consent and authorize [facility] and its employed or contracted physicians, physician assistants, nurse practitioners or other licensed health care professionals (the Practitioner), to provide me with telemedicine health care services (the "Services") as deemed necessary by the treating Practitioner. I acknowledge and consent to receive the Services by the Practitioner via telemedicine. I understand that the telemedicine visit will involve communicating with the Practitioner through live audiovisual communication technology and the disclosure of certain medical information by electronic transmission. I acknowledge that I have been given the opportunity to request an in-person assessment or other available alternative prior to the telemedicine visit and am voluntarily participating in the telemedicine visit.  I understand that I have the right to withhold or withdraw my consent to the use of telemedicine in the course of my care at any time, without affecting my right to future care or treatment, and that the Practitioner or I may terminate the telemedicine visit at any time. I understand that I have the right to inspect all information obtained and/or recorded in the course of the telemedicine visit and may receive copies of available information for a reasonable fee.  I understand that some of the potential risks of receiving the Services via telemedicine include:  Delay or interruption in medical evaluation due to technological equipment failure or disruption; Information transmitted may not be sufficient (e.g. poor resolution of images) to allow for appropriate medical decision making by the Practitioner;  and/or  In rare instances, security protocols could fail, causing a breach of personal health information.  Furthermore, I acknowledge that it is my responsibility to provide information about my medical history, conditions and care that is complete and accurate to the best of my ability. I acknowledge that Practitioner's advice, recommendations, and/or decision may be based on factors not within their control, such as incomplete or inaccurate data provided by me or distortions of diagnostic images or specimens that may result from electronic transmissions. I understand that the practice of medicine is not an exact science and that Practitioner makes no warranties or guarantees regarding treatment outcomes. I acknowledge that a copy of this consent can be made available to me via my patient portal [facility] MyChart), or I can request a printed copy by calling the office of [facility].    I understand that my insurance will be billed for this visit.   I have read or had this consent read to me. I understand the contents of this consent, which adequately explains the benefits and risks of the Services being provided via telemedicine.  I have been provided ample opportunity to ask questions regarding this consent and the Services and have had my questions answered to my satisfaction. I give my informed consent for the services to be provided through the use of telemedicine in my medical care

## 2023-10-16 ENCOUNTER — Telehealth (HOSPITAL_BASED_OUTPATIENT_CLINIC_OR_DEPARTMENT_OTHER): Payer: Self-pay | Admitting: Licensed Clinical Social Worker

## 2023-10-16 NOTE — Telephone Encounter (Signed)
 H&V Care Navigation CSW Progress Note  Clinical Social Worker contacted caregiver by phone (pt daughter [place], [place] on file) to f/u on advanced care planning documents mailed to pt home to complete. Was not able to reach her today at [phone]. Encouraged her to contact me if she didn't receive them or if she had any additional questions for myself or provider. Will re-attempt again as able.  Patient is participating in a Managed Medicaid Plan:  No, Aetna Medicare only.   SDOH Screenings   Food Insecurity: No Food Insecurity (10/09/2023)  Housing: Low Risk  (10/09/2023)  Transportation Needs: No Transportation Needs (10/09/2023)  Utilities: Not At Risk (10/09/2023)  Financial Resource Strain: Low Risk  (10/09/2023)  Tobacco Use: Medium Risk (10/09/2023)  Health Literacy: Inadequate Health Literacy (10/09/2023)   Marit Lark, MSW, LCSW Clinical Social Worker II [facility] Heart/Vascular Care Navigation  [phone]- work cell phone (preferred) [phone]- desk phone

## 2023-10-19 ENCOUNTER — Telehealth: Payer: Self-pay | Admitting: Licensed Clinical Social Worker

## 2023-10-19 NOTE — Telephone Encounter (Signed)
 H&V Care Navigation CSW Progress Note  Clinical Social Worker contacted caregiver by phone (pt daughter [name], [place] on file) to f/u on advanced care planning documents mailed to pt home to complete. Was not able to reach her today at [phone], left 2nd voicemail. Encouraged her to contact me if she didn't receive them or if she had any additional questions for myself or provider. Will re-attempt again as able.   Patient is participating in a Managed Medicaid Plan:  No, Aetna Medicare only.   SDOH Screenings   Food Insecurity: No Food Insecurity (10/09/2023)  Housing: Low Risk  (10/09/2023)  Transportation Needs: No Transportation Needs (10/09/2023)  Utilities: Not At Risk (10/09/2023)  Financial Resource Strain: Low Risk  (10/09/2023)  Tobacco Use: Medium Risk (10/09/2023)  Health Literacy: Inadequate Health Literacy (10/09/2023)   Nathen Balder, MSW, LCSW Clinical Social Worker II [facility] Heart/Vascular Care Navigation  [phone]- work cell phone (preferred) [phone]- desk phone

## 2023-10-21 ENCOUNTER — Ambulatory Visit (HOSPITAL_BASED_OUTPATIENT_CLINIC_OR_DEPARTMENT_OTHER): Payer: Medicare HMO | Admitting: General Surgery

## 2023-10-26 ENCOUNTER — Telehealth: Payer: Self-pay | Admitting: Licensed Clinical Social Worker

## 2023-10-26 NOTE — Telephone Encounter (Signed)
H&V Care Navigation CSW Progress Note  Clinical Social Worker contacted caregiver by phone (pt daughter [name], [place] on file) to f/u on advanced care planning documents mailed to pt home to complete. Was not able to reach her again today at [phone], left 3rd and final voicemail at this time as I do not want to add any pressure to complete documents if not comfortable doing so. Encouraged her to contact me if she didn't receive them or if she had any additional questions for myself or provider. Remain available as needed.    Patient is participating in a Managed Medicaid Plan:  No, Aetna Medicare only.   SDOH Screenings   Food Insecurity: No Food Insecurity (10/09/2023)  Housing: Low Risk  (10/09/2023)  Transportation Needs: No Transportation Needs (10/09/2023)  Utilities: Not At Risk (10/09/2023)  Financial Resource Strain: Low Risk  (10/09/2023)  Tobacco Use: Medium Risk (10/09/2023)  Health Literacy: Inadequate Health Literacy (10/09/2023)    Octavio Graves, MSW, LCSW Clinical Social Worker II [facility] Heart/Vascular Care Navigation  [phone]- work cell phone (preferred) [phone]- desk phone

## 2023-10-28 ENCOUNTER — Ambulatory Visit (HOSPITAL_BASED_OUTPATIENT_CLINIC_OR_DEPARTMENT_OTHER): Payer: Medicare HMO | Admitting: General Surgery

## 2023-11-03 NOTE — Progress Notes (Signed)
 [facility] - Clinic Note  11/17/2023     CHIEF COMPLAINT Patient presents for Retina Follow Up   HISTORY OF PRESENT ILLNESS: [name] is a 89 y.o. female who presents to the clinic today for:   HPI     Retina Follow Up   Patient presents with  Dry AMD.  In both eyes.  This started 6 months ago.  I, the attending physician,  performed the HPI with the patient and updated documentation appropriately.        Comments   Patient here for 6 months retina follow up for non exu ARMD OU. Patient states vision doing ok. No eye pain. Can't see words in puzzle book. Don't see some colors like red when has nose bleeds. When at the wound center for toes, can't see toes. Can't see some things on TV. Looking out the kitchen window can't see squirrels or deer in the yard. Started about a year ago. Put on ciprofloxacin for UTI yesterday. Sodium was low put on sodium. Taken off warfarin. Was in hospital in December 2024 blood was low. Takes pantoprazole upped to 2 times a day.      Last edited by Rennis Chris, MD on 11/17/2023  5:53 PM.    Patient states vision blurred. Can't see words in puzzle book. Also has difficulty seeing TV. Colors like red are difficult to distinguish.   Referring physician: Camie Patience, FNP [address] [place],  [place] [address]  HISTORICAL INFORMATION:   Selected notes from the MEDICAL RECORD NUMBER Referred by Dr. Karleen Hampshire for ret eval OU   CURRENT MEDICATIONS: Current Outpatient Medications (Ophthalmic Drugs)  Medication Sig   carboxymethylcellulose (REFRESH PLUS) 0.5 % SOLN Place 1 drop into both eyes 2 (two) times daily.   No current facility-administered medications for this visit. (Ophthalmic Drugs)   Current Outpatient Medications (Other)  Medication Sig   acetaminophen (TYLENOL) 325 MG tablet Take 2 tablets (650 mg total) by mouth every 6 (six) hours as needed for mild pain or headache. (Patient taking  differently: Take 650 mg by mouth every 6 (six) hours as needed for mild pain (pain score 1-3), headache or fever.)   ascorbic acid (VITAMIN C) 500 MG tablet Take 500 mg by mouth See admin instructions. Pts family says that is it "sporatic"   aspirin EC 81 MG tablet Take 1 tablet (81 mg total) by mouth daily. Swallow whole.   atorvastatin (LIPITOR) 10 MG tablet Take 1 tablet (10 mg total) by mouth daily.   bumetanide (BUMEX) 0.5 MG tablet Take 0.5 mg by mouth in the morning and at bedtime.   diclofenac Sodium (VOLTAREN) 1 % GEL Apply 4 g topically 4 (four) times daily. To bilateral feet and knees (Patient taking differently: Apply 4 g topically 4 (four) times daily as needed (pain). To bilateral feet and knees)   estradiol (ESTRACE) 0.1 MG/GM vaginal cream Place 1 Applicatorful vaginally daily.   gabapentin (NEURONTIN) 300 MG capsule Take 300 mg by mouth 2 (two) times daily.    levothyroxine (SYNTHROID) 100 MCG tablet Take 100 mcg by mouth daily before breakfast.   Multiple Vitamins-Minerals (PRESERVISION/LUTEIN) CAPS Take 1 capsule by mouth 2 (two) times daily.    ondansetron (ZOFRAN) 4 MG tablet Take 4 mg by mouth daily as needed.   OVER THE COUNTER MEDICATION Take 1 capsule by mouth at bedtime. Equate Neuriva   senna-docusate (SENOKOT-S) 8.6-50 MG tablet Take 1 tablet by mouth 2 (  two) times daily between meals as needed for mild constipation. (Patient taking differently: Take 1 tablet by mouth at bedtime.)   spironolactone (ALDACTONE) 25 MG tablet TAKE 1/2 TABLET BY MOUTH EVERY DAY   pantoprazole (PROTONIX) 40 MG tablet Take 1 tablet (40 mg total) by mouth 2 (two) times daily.   No current facility-administered medications for this visit. (Other)   REVIEW OF SYSTEMS: ROS   Positive for: Eyes Negative for: Constitutional, Gastrointestinal, Neurological, Skin, Genitourinary, Musculoskeletal, HENT, Endocrine, Cardiovascular, Respiratory, Psychiatric, Allergic/Imm, Heme/Lymph Last edited by  Laddie Aquas, COA on 11/17/2023  2:59 PM.     ALLERGIES Allergies  Allergen Reactions   Amoxicillin Other (See Comments)    Unknown reaction  Tolerates Keflex, Cefepime   Azithromycin Other (See Comments)    Unknown reaction    Codeine Other (See Comments)    Unknown reaction  Other Reaction(s): Not available, Not available, Not available, Not available, Not available, Not available, Not available   Erythromycin Other (See Comments)    Unknown reaction    Green Dyes Other (See Comments)    Allergic to ALL dyes   Iodine Other (See Comments)    Unknown reaction    Misc. Sulfonamide Containing Compounds    Oxycodone Other (See Comments)    Unknown reaction      Oxycodone-Acetaminophen Other (See Comments)    Unknown reaction    Penicillins Other (See Comments)    Unknown reaction  Tolerates Keflex, Cefepime   Sulfa Antibiotics     Per Pt's daughter - unknown reaction    Sulfasalazine     Unsure of allergy    Tramadol Hives   PAST MEDICAL HISTORY Past Medical History:  Diagnosis Date   Anticoagulant long-term use    Arrhythmia    Arthritis    Atrial fibrillation (HCC)    Congestive heart failure (CHF) (HCC)    Dysphagia    Esophageal reflux    Essential hypertension    Hiatal hernia    Pulmonary embolus (HCC)    Thyroid disease    Past Surgical History:  Procedure Laterality Date   ABDOMINAL AORTOGRAM W/LOWER EXTREMITY N/A 03/04/2023   Procedure: ABDOMINAL AORTOGRAM W/LOWER EXTREMITY;  Surgeon: [name], MD;  Location: [facility] INVASIVE CV LAB;  Service: Cardiovascular;  Laterality: N/A;   AMPUTATION TOE Right 11/17/2021   Procedure: RIGHT SECOND TOE AMPUTATION;  Surgeon: [name], MD;  Location: [facility] ORS;  Service: Orthopedics;  Laterality: Right;   COLONOSCOPY     ELBOW SURGERY     LAPAROSCOPIC HYSTERECTOMY     FAMILY HISTORY Family History  Problem Relation Age of Onset   Stroke Mother    Cancer Brother    SOCIAL HISTORY Social History    Tobacco Use   Smoking status: Former    Current packs/day: 0.00    Types: Cigarettes    Quit date: 10/02/2001    Years since quitting: 22.1   Smokeless tobacco: Never   Tobacco comments:    former smoker  Vaping Use   Vaping status: Never Used  Substance Use Topics   Alcohol use: Never   Drug use: Never       OPHTHALMIC EXAM:  Base Eye Exam     Visual Acuity (Snellen - Linear)       Right Left   Dist [place] 20/80 +2 20/60 -1   Dist ph  20/70 -1 NI    Correction: Glasses         Tonometry (Tonopen, 2:53 PM)  Right Left   Pressure 07 08         Pupils       Dark Light Shape React APD   Right 4 3 Round Brisk None   Left 4 3 Round Brisk None         Visual Fields (Counting fingers)       Left Right    Full Full         Extraocular Movement       Right Left    Full, Ortho Full, Ortho         Neuro/Psych     Oriented x3: Yes   Mood/Affect: Normal         Dilation     Both eyes: 1.0% Mydriacyl, 2.5% Phenylephrine @ 2:52 PM           Slit Lamp and Fundus Exam     Slit Lamp Exam       Right Left   Lids/Lashes Dermatochalasis - upper lid, mild Meibomian gland dysfunction Dermatochalasis - upper lid, mild Meibomian gland dysfunction   Conjunctiva/Sclera White and quiet White and quiet   Cornea arcus, 2-3+ Punctate epithelial erosions, irregular tear film, decreased TBUT, mild central haze, 2+guttata, 1-2+, Descemet's folds, tear film debris, well healed cataract wound arcus, 1-2+ Punctate epithelial erosions, decreased TBUT, mild central haze, 1+guttata, no edema, tear film debris   Anterior Chamber Deep and clear, narrow temporal angle Deep and clear, narrow temporal angle   Iris Round and dilated Round and dilated   Lens PC IOL in good position with open PC PC IOL in good position with open PC   Anterior Vitreous Vitreous syneresis Vitreous syneresis         Fundus Exam       Right Left   Disc 360 PPA, mld Pallor, Sharp  rim mild Pallor, Sharp rim   C/D Ratio 0.3 0.3   Macula Flat, Blunted foveal reflex, RPE mottling, clumping and atrophy, Drusen, prominent GA superior and nasal to fovea -- slightly increased, No heme or edema Flat, Blunted foveal reflex, RPE mottling, clumping and atrophy, +GA -- slightly increased, No heme or edema   Vessels Vascular attenuation, mild tortuousity attenuated, Tortuous   Periphery Attached, reticular degeneration, No heme Attached, mild reticular degeneration, scattered, punctate IRH--rare           Refraction     Wearing Rx       Sphere Cylinder Axis   Right -1.50 +1.50 005   Left -0.25 +1.50 175            IMAGING AND PROCEDURES  Imaging and Procedures for 11/17/2023  OCT, Retina - OU - Both Eyes       Right Eye Quality was good. Central Foveal Thickness: 293. Progression has worsened. Findings include normal foveal contour, no IRF, no SRF, retinal drusen , outer retinal atrophy (Mild interval progression of perifoveal ORA/GA -- seen best on en face image).   Left Eye Quality was good. Central Foveal Thickness: 293. Progression has worsened. Findings include normal foveal contour, no IRF, no SRF, retinal drusen , intraretinal hyper-reflective material, subretinal fluid, outer retinal atrophy (Mild progression of ORA/GA greatest superior and nasal mac -- seen best on en face image).   Notes *Images captured and stored on drive  Diagnosis / Impression:  Nonexudative ARMD OU -- No IRF/SRF OU OD: interval progression of perifoveal ORA/GA -- seen best on en face images OS: Mild progression of ORA/GA greatest superior and nasal mac --  seen best on en face images   Clinical management:  See below  Abbreviations: NFP - Normal foveal profile. CME - cystoid macular edema. PED - pigment epithelial detachment. IRF - intraretinal fluid. SRF - subretinal fluid. EZ - ellipsoid zone. ERM - epiretinal membrane. ORA - outer retinal atrophy. ORT - outer retinal  tubulation. SRHM - subretinal hyper-reflective material. IRHM - intraretinal hyper-reflective material            ASSESSMENT/PLAN:    ICD-10-CM   1. Advanced atrophic nonexudative age-related macular degeneration of both eyes without subfoveal involvement  H35.3133 OCT, Retina - OU - Both Eyes    2. Essential hypertension  I10     3. Hypertensive retinopathy of both eyes  H35.033     4. Pseudophakia, both eyes  Z96.1     5. Dry eyes  H04.123      1. Age related macular degeneration, non-exudative, with +GA, both eyes (OS>OD)  - focal peripapillary ORA OU  - BCVA OD improved from 20/80 to 20/70; OS improved from 20/150 to 20/60  - OCT shows Mild progression of ORA/GA greatest superior and nasal mac OU -- seen best on en face images  - Cont amsler grid monitoring and AREDS2 supplements  - f/u 6-9 mos, sooner prn - DFE, OCT  2,3. Hypertensive retinopathy OU - discussed importance of tight BP control - monitor  4. Pseudophakia OU  - s/p CE/IOL OU  - IOLs in good position  - s/p YAG Cap OD (Dr. Karleen Hampshire) - s/p YAG Cap OS (05.01.24 - Dr. Vanessa Barbara)  5. Dry eyes OU  - recommend artificial tears and lubricating ointment as needed   Ophthalmic Meds Ordered this visit:  No orders of the defined types were placed in this encounter.    Return for 6-9 mos - nonexudative ARMD OU - DFE, OCT.  There are no Patient Instructions on file for this visit.   Explained the diagnoses, plan, and follow up with the patient and they expressed understanding.  Patient expressed understanding of the importance of proper follow up care.  This document serves as a record of services personally performed by Karie Chimera, MD, PhD. It was created on their behalf by Laurey Morale, COT an ophthalmic technician. The creation of this record is the provider's dictation and/or activities during the visit.    Electronically signed by:  Charlette Caffey, COT  11/17/23 5:54 PM  This document  serves as a record of services personally performed by Karie Chimera, MD, PhD. It was created on their behalf by Annalee Genta, COMT. The creation of this record is the provider's dictation and/or activities during the visit.  Electronically signed by: Annalee Genta, COMT 11/17/23 5:54 PM  Karie Chimera, M.D., Ph.D. Diseases & Surgery of the Retina and Vitreous [facility]  I have reviewed the above documentation for accuracy and completeness, and I agree with the above. Karie Chimera, M.D., Ph.D. 11/17/23 5:55 PM   Abbreviations: M myopia (nearsighted); A astigmatism; H hyperopia (farsighted); P presbyopia; Mrx spectacle prescription;  CTL contact lenses; OD right eye; OS left eye; OU both eyes  XT exotropia; ET esotropia; PEK punctate epithelial keratitis; PEE punctate epithelial erosions; DES dry eye syndrome; MGD meibomian gland dysfunction; ATs artificial tears; PFAT's preservative free artificial tears; NSC nuclear sclerotic cataract; PSC posterior subcapsular cataract; ERM epi-retinal membrane; PVD posterior vitreous detachment; RD retinal detachment; DM diabetes mellitus; DR diabetic retinopathy; NPDR non-proliferative diabetic retinopathy; PDR proliferative  diabetic retinopathy; CSME clinically significant macular edema; DME diabetic macular edema; dbh dot blot hemorrhages; CWS cotton wool spot; POAG primary open angle glaucoma; C/D cup-to-disc ratio; HVF humphrey visual field; GVF goldmann visual field; OCT optical coherence tomography; IOP intraocular pressure; BRVO Branch retinal vein occlusion; CRVO central retinal vein occlusion; CRAO central retinal artery occlusion; BRAO branch retinal artery occlusion; RT retinal tear; SB scleral buckle; PPV pars plana vitrectomy; VH Vitreous hemorrhage; PRP panretinal laser photocoagulation; IVK intravitreal kenalog; VMT vitreomacular traction; MH Macular hole;  NVD neovascularization of the disc; NVE neovascularization  elsewhere; AREDS age related eye disease study; ARMD age related macular degeneration; POAG primary open angle glaucoma; EBMD epithelial/anterior basement membrane dystrophy; ACIOL anterior chamber intraocular lens; IOL intraocular lens; PCIOL posterior chamber intraocular lens; Phaco/IOL phacoemulsification with intraocular lens placement; PRK photorefractive keratectomy; LASIK laser assisted in situ keratomileusis; HTN hypertension; DM diabetes mellitus; COPD chronic obstructive pulmonary disease

## 2023-11-04 ENCOUNTER — Ambulatory Visit (HOSPITAL_BASED_OUTPATIENT_CLINIC_OR_DEPARTMENT_OTHER): Payer: Medicare HMO | Admitting: General Surgery

## 2023-11-05 ENCOUNTER — Encounter (HOSPITAL_BASED_OUTPATIENT_CLINIC_OR_DEPARTMENT_OTHER): Payer: Medicare HMO | Attending: General Surgery | Admitting: General Surgery

## 2023-11-05 DIAGNOSIS — L97526 Non-pressure chronic ulcer of other part of left foot with bone involvement without evidence of necrosis: Secondary | ICD-10-CM | POA: Diagnosis not present

## 2023-11-05 DIAGNOSIS — N1832 Chronic kidney disease, stage 3b: Secondary | ICD-10-CM | POA: Diagnosis not present

## 2023-11-05 DIAGNOSIS — L97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 without evidence of necrosis: Secondary | ICD-10-CM | POA: Insufficient documentation

## 2023-11-05 DIAGNOSIS — L89623 Pressure ulcer of left heel, stage 3: Secondary | ICD-10-CM | POA: Diagnosis present

## 2023-11-05 DIAGNOSIS — I5032 Chronic diastolic (congestive) heart failure: Secondary | ICD-10-CM | POA: Diagnosis not present

## 2023-11-05 DIAGNOSIS — I739 Peripheral vascular disease, unspecified: Secondary | ICD-10-CM | POA: Diagnosis not present

## 2023-11-05 DIAGNOSIS — I872 Venous insufficiency (chronic) (peripheral): Secondary | ICD-10-CM | POA: Diagnosis not present

## 2023-11-13 ENCOUNTER — Other Ambulatory Visit: Payer: Self-pay | Admitting: Family Medicine

## 2023-11-13 ENCOUNTER — Ambulatory Visit
Admission: RE | Admit: 2023-11-13 | Discharge: 2023-11-13 | Disposition: A | Discharge status: Home / Self Care | Source: Ambulatory Visit | Attending: Family Medicine | Admitting: Family Medicine

## 2023-11-13 DIAGNOSIS — R0902 Hypoxemia: Secondary | ICD-10-CM

## 2023-11-17 ENCOUNTER — Encounter (INDEPENDENT_AMBULATORY_CARE_PROVIDER_SITE_OTHER): Payer: Self-pay | Admitting: Ophthalmology

## 2023-11-17 ENCOUNTER — Ambulatory Visit (INDEPENDENT_AMBULATORY_CARE_PROVIDER_SITE_OTHER): Payer: Medicare HMO | Admitting: Ophthalmology

## 2023-11-17 DIAGNOSIS — Z961 Presence of intraocular lens: Secondary | ICD-10-CM | POA: Diagnosis not present

## 2023-11-17 DIAGNOSIS — I10 Essential (primary) hypertension: Secondary | ICD-10-CM | POA: Diagnosis not present

## 2023-11-17 DIAGNOSIS — H04123 Dry eye syndrome of bilateral lacrimal glands: Secondary | ICD-10-CM

## 2023-11-17 DIAGNOSIS — H35033 Hypertensive retinopathy, bilateral: Secondary | ICD-10-CM | POA: Diagnosis not present

## 2023-11-17 DIAGNOSIS — H353133 Nonexudative age-related macular degeneration, bilateral, advanced atrophic without subfoveal involvement: Secondary | ICD-10-CM | POA: Diagnosis not present

## 2023-12-02 ENCOUNTER — Encounter (HOSPITAL_BASED_OUTPATIENT_CLINIC_OR_DEPARTMENT_OTHER): Payer: Medicare HMO | Attending: General Surgery | Admitting: General Surgery

## 2023-12-02 DIAGNOSIS — I872 Venous insufficiency (chronic) (peripheral): Secondary | ICD-10-CM | POA: Diagnosis not present

## 2023-12-02 DIAGNOSIS — L97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 without evidence of necrosis: Secondary | ICD-10-CM | POA: Diagnosis not present

## 2023-12-02 DIAGNOSIS — I739 Peripheral vascular disease, unspecified: Secondary | ICD-10-CM | POA: Insufficient documentation

## 2023-12-02 DIAGNOSIS — I5032 Chronic diastolic (congestive) heart failure: Secondary | ICD-10-CM | POA: Diagnosis not present

## 2023-12-02 DIAGNOSIS — L89623 Pressure ulcer of left heel, stage 3: Secondary | ICD-10-CM | POA: Diagnosis present

## 2023-12-02 DIAGNOSIS — L97526 Non-pressure chronic ulcer of other part of left foot with bone involvement without evidence of necrosis: Secondary | ICD-10-CM | POA: Insufficient documentation

## 2023-12-02 DIAGNOSIS — N1832 Chronic kidney disease, stage 3b: Secondary | ICD-10-CM | POA: Insufficient documentation

## 2023-12-05 ENCOUNTER — Emergency Department (HOSPITAL_COMMUNITY)

## 2023-12-05 ENCOUNTER — Inpatient Hospital Stay (HOSPITAL_COMMUNITY)
Admission: EM | Admit: 2023-12-05 | Discharge: 2023-12-07 | DRG: 812 | Disposition: A | Discharge status: Home Health | Attending: Internal Medicine | Admitting: Internal Medicine

## 2023-12-05 ENCOUNTER — Encounter (HOSPITAL_COMMUNITY): Payer: Self-pay

## 2023-12-05 ENCOUNTER — Other Ambulatory Visit: Payer: Self-pay

## 2023-12-05 DIAGNOSIS — D649 Anemia, unspecified: Secondary | ICD-10-CM | POA: Diagnosis not present

## 2023-12-05 DIAGNOSIS — J449 Chronic obstructive pulmonary disease, unspecified: Secondary | ICD-10-CM | POA: Diagnosis present

## 2023-12-05 DIAGNOSIS — I2721 Secondary pulmonary arterial hypertension: Secondary | ICD-10-CM | POA: Diagnosis present

## 2023-12-05 DIAGNOSIS — S91102A Unspecified open wound of left great toe without damage to nail, initial encounter: Secondary | ICD-10-CM | POA: Diagnosis present

## 2023-12-05 DIAGNOSIS — Z66 Do not resuscitate: Secondary | ICD-10-CM | POA: Diagnosis present

## 2023-12-05 DIAGNOSIS — I739 Peripheral vascular disease, unspecified: Secondary | ICD-10-CM | POA: Diagnosis present

## 2023-12-05 DIAGNOSIS — S91105A Unspecified open wound of left lesser toe(s) without damage to nail, initial encounter: Secondary | ICD-10-CM | POA: Diagnosis present

## 2023-12-05 DIAGNOSIS — F0390 Unspecified dementia without behavioral disturbance: Secondary | ICD-10-CM | POA: Diagnosis present

## 2023-12-05 DIAGNOSIS — Z885 Allergy status to narcotic agent status: Secondary | ICD-10-CM

## 2023-12-05 DIAGNOSIS — S91101A Unspecified open wound of right great toe without damage to nail, initial encounter: Secondary | ICD-10-CM | POA: Diagnosis present

## 2023-12-05 DIAGNOSIS — Z79818 Long term (current) use of other agents affecting estrogen receptors and estrogen levels: Secondary | ICD-10-CM

## 2023-12-05 DIAGNOSIS — Z86711 Personal history of pulmonary embolism: Secondary | ICD-10-CM

## 2023-12-05 DIAGNOSIS — S91104A Unspecified open wound of right lesser toe(s) without damage to nail, initial encounter: Secondary | ICD-10-CM | POA: Diagnosis present

## 2023-12-05 DIAGNOSIS — J9611 Chronic respiratory failure with hypoxia: Secondary | ICD-10-CM | POA: Diagnosis present

## 2023-12-05 DIAGNOSIS — E871 Hypo-osmolality and hyponatremia: Secondary | ICD-10-CM | POA: Diagnosis present

## 2023-12-05 DIAGNOSIS — Z9102 Food additives allergy status: Secondary | ICD-10-CM

## 2023-12-05 DIAGNOSIS — Z809 Family history of malignant neoplasm, unspecified: Secondary | ICD-10-CM

## 2023-12-05 DIAGNOSIS — Z9981 Dependence on supplemental oxygen: Secondary | ICD-10-CM

## 2023-12-05 DIAGNOSIS — Z87891 Personal history of nicotine dependence: Secondary | ICD-10-CM

## 2023-12-05 DIAGNOSIS — I130 Hypertensive heart and chronic kidney disease with heart failure and stage 1 through stage 4 chronic kidney disease, or unspecified chronic kidney disease: Secondary | ICD-10-CM | POA: Diagnosis present

## 2023-12-05 DIAGNOSIS — I350 Nonrheumatic aortic (valve) stenosis: Secondary | ICD-10-CM | POA: Diagnosis present

## 2023-12-05 DIAGNOSIS — Z7982 Long term (current) use of aspirin: Secondary | ICD-10-CM

## 2023-12-05 DIAGNOSIS — Z880 Allergy status to penicillin: Secondary | ICD-10-CM

## 2023-12-05 DIAGNOSIS — I4821 Permanent atrial fibrillation: Secondary | ICD-10-CM | POA: Diagnosis present

## 2023-12-05 DIAGNOSIS — Z7901 Long term (current) use of anticoagulants: Secondary | ICD-10-CM

## 2023-12-05 DIAGNOSIS — K219 Gastro-esophageal reflux disease without esophagitis: Secondary | ICD-10-CM | POA: Diagnosis present

## 2023-12-05 DIAGNOSIS — Z823 Family history of stroke: Secondary | ICD-10-CM

## 2023-12-05 DIAGNOSIS — Z882 Allergy status to sulfonamides status: Secondary | ICD-10-CM

## 2023-12-05 DIAGNOSIS — Z89421 Acquired absence of other right toe(s): Secondary | ICD-10-CM

## 2023-12-05 DIAGNOSIS — I5032 Chronic diastolic (congestive) heart failure: Secondary | ICD-10-CM | POA: Diagnosis present

## 2023-12-05 DIAGNOSIS — X58XXXA Exposure to other specified factors, initial encounter: Secondary | ICD-10-CM | POA: Diagnosis present

## 2023-12-05 DIAGNOSIS — R195 Other fecal abnormalities: Secondary | ICD-10-CM

## 2023-12-05 DIAGNOSIS — N1832 Chronic kidney disease, stage 3b: Secondary | ICD-10-CM | POA: Diagnosis present

## 2023-12-05 DIAGNOSIS — Z79890 Hormone replacement therapy (postmenopausal): Secondary | ICD-10-CM

## 2023-12-05 DIAGNOSIS — E039 Hypothyroidism, unspecified: Secondary | ICD-10-CM | POA: Diagnosis present

## 2023-12-05 DIAGNOSIS — Z79899 Other long term (current) drug therapy: Secondary | ICD-10-CM

## 2023-12-05 DIAGNOSIS — E7800 Pure hypercholesterolemia, unspecified: Secondary | ICD-10-CM | POA: Diagnosis present

## 2023-12-05 DIAGNOSIS — Z881 Allergy status to other antibiotic agents status: Secondary | ICD-10-CM

## 2023-12-05 LAB — OSMOLALITY: Osmolality: 277 mosm/kg (ref 275–295)

## 2023-12-05 LAB — BRAIN NATRIURETIC PEPTIDE: B Natriuretic Peptide: 252.7 pg/mL — ABNORMAL HIGH (ref 0.0–100.0)

## 2023-12-05 LAB — CBC
HCT: 24.2 % — ABNORMAL LOW (ref 36.0–46.0)
Hemoglobin: 7.2 g/dL — ABNORMAL LOW (ref 12.0–15.0)
MCH: 26 pg (ref 26.0–34.0)
MCHC: 29.8 g/dL — ABNORMAL LOW (ref 30.0–36.0)
MCV: 87.4 fL (ref 80.0–100.0)
Platelets: 192 10*3/uL (ref 150–400)
RBC: 2.77 MIL/uL — ABNORMAL LOW (ref 3.87–5.11)
RDW: 16.4 % — ABNORMAL HIGH (ref 11.5–15.5)
WBC: 5.8 10*3/uL (ref 4.0–10.5)
nRBC: 0 % (ref 0.0–0.2)

## 2023-12-05 LAB — MAGNESIUM: Magnesium: 2.4 mg/dL (ref 1.7–2.4)

## 2023-12-05 LAB — COMPREHENSIVE METABOLIC PANEL WITH GFR
ALT: 12 U/L (ref 0–44)
AST: 27 U/L (ref 15–41)
Albumin: 3.2 g/dL — ABNORMAL LOW (ref 3.5–5.0)
Alkaline Phosphatase: 106 U/L (ref 38–126)
Anion gap: 8 (ref 5–15)
BUN: 19 mg/dL (ref 8–23)
CO2: 34 mmol/L — ABNORMAL HIGH (ref 22–32)
Calcium: 9.2 mg/dL (ref 8.9–10.3)
Chloride: 87 mmol/L — ABNORMAL LOW (ref 98–111)
Creatinine, Ser: 1.21 mg/dL — ABNORMAL HIGH (ref 0.44–1.00)
GFR, Estimated: 42 mL/min — ABNORMAL LOW (ref >60)
Glucose, Bld: 115 mg/dL — ABNORMAL HIGH (ref 70–99)
Potassium: 3.8 mmol/L (ref 3.5–5.1)
Sodium: 129 mmol/L — ABNORMAL LOW (ref 135–145)
Total Bilirubin: 0.7 mg/dL (ref 0.0–1.2)
Total Protein: 6.9 g/dL (ref 6.5–8.1)

## 2023-12-05 LAB — TROPONIN I (HIGH SENSITIVITY)
Troponin I (High Sensitivity): 21 ng/L — ABNORMAL HIGH (ref <18)
Troponin I (High Sensitivity): 23 ng/L — ABNORMAL HIGH (ref <18)

## 2023-12-05 LAB — PREPARE RBC (CROSSMATCH)

## 2023-12-05 LAB — POC OCCULT BLOOD, ED: Fecal Occult Bld: POSITIVE — AB

## 2023-12-05 LAB — GLUCOSE, CAPILLARY: Glucose-Capillary: 105 mg/dL — ABNORMAL HIGH (ref 70–99)

## 2023-12-05 MED ORDER — SENNOSIDES-DOCUSATE SODIUM 8.6-50 MG PO TABS: 1.0000 | ORAL_TABLET | Freq: Every evening | ORAL | Status: DC | PRN

## 2023-12-05 MED ORDER — BUMETANIDE 0.5 MG PO TABS: 0.5000 mg | ORAL_TABLET | Freq: Two times a day (BID) | ORAL | Status: DC

## 2023-12-05 MED ORDER — SPIRONOLACTONE 12.5 MG HALF TABLET
12.5000 mg | ORAL_TABLET | Freq: Every day | ORAL | Status: DC
  Administered 2023-12-06 – 2023-12-07 (×2): 12.5 mg via ORAL
  Filled 2023-12-05 (×2): qty 1

## 2023-12-05 MED ORDER — ACETAMINOPHEN 325 MG PO TABS
650.0000 mg | ORAL_TABLET | Freq: Four times a day (QID) | ORAL | Status: DC | PRN
  Administered 2023-12-06: 650 mg via ORAL
  Filled 2023-12-05: qty 2

## 2023-12-05 MED ORDER — HYDRALAZINE HCL 20 MG/ML IJ SOLN
10.0000 mg | Freq: Four times a day (QID) | INTRAMUSCULAR | Status: DC | PRN
  Administered 2023-12-07: 10 mg via INTRAVENOUS
  Filled 2023-12-05: qty 1

## 2023-12-05 MED ORDER — ATORVASTATIN CALCIUM 10 MG PO TABS
10.0000 mg | ORAL_TABLET | Freq: Every day | ORAL | Status: DC
  Administered 2023-12-06 – 2023-12-07 (×2): 10 mg via ORAL
  Filled 2023-12-05 (×2): qty 1

## 2023-12-05 MED ORDER — SODIUM CHLORIDE 0.9% FLUSH
3.0000 mL | Freq: Two times a day (BID) | INTRAVENOUS | Status: DC
  Administered 2023-12-05 – 2023-12-07 (×4): 3 mL via INTRAVENOUS

## 2023-12-05 MED ORDER — ONDANSETRON HCL 4 MG PO TABS
4.0000 mg | ORAL_TABLET | Freq: Four times a day (QID) | ORAL | Status: DC | PRN
Start: 2023-12-05 — End: 2023-12-07

## 2023-12-05 MED ORDER — FUROSEMIDE 10 MG/ML IJ SOLN
40.0000 mg | Freq: Once | INTRAMUSCULAR | Status: AC
  Administered 2023-12-06: 40 mg via INTRAVENOUS
  Filled 2023-12-05: qty 4

## 2023-12-05 MED ORDER — LEVOTHYROXINE SODIUM 100 MCG PO TABS
100.0000 ug | ORAL_TABLET | Freq: Every day | ORAL | Status: DC
  Administered 2023-12-06 – 2023-12-07 (×2): 100 ug via ORAL
  Filled 2023-12-05 (×2): qty 1

## 2023-12-05 MED ORDER — ACETAMINOPHEN 650 MG RE SUPP: 650.0000 mg | Freq: Four times a day (QID) | RECTAL | Status: DC | PRN

## 2023-12-05 MED ORDER — PANTOPRAZOLE SODIUM 40 MG IV SOLR
40.0000 mg | Freq: Two times a day (BID) | INTRAVENOUS | Status: DC
  Administered 2023-12-05 – 2023-12-07 (×4): 40 mg via INTRAVENOUS
  Filled 2023-12-05 (×4): qty 10

## 2023-12-05 MED ORDER — ONDANSETRON HCL 4 MG/2ML IJ SOLN
4.0000 mg | Freq: Four times a day (QID) | INTRAMUSCULAR | Status: DC | PRN
  Administered 2023-12-06: 4 mg via INTRAVENOUS
  Filled 2023-12-05: qty 2

## 2023-12-05 MED ORDER — SODIUM CHLORIDE 0.9% IV SOLUTION: Freq: Once | INTRAVENOUS | Status: DC

## 2023-12-05 NOTE — ED Provider Notes (Signed)
 [facility]  EMERGENCY DEPARTMENT AT [facility] Provider Note   CSN: [id number] Arrival date & time: 12/05/23  1548     History {Add pertinent medical, surgical, social history, OB history to HPI:1} Chief Complaint  Patient presents with   Abnormal Labs    [name] is a 89 y.o. female.  She has a history of A-fib, pulmonary hypertension, COPD, CKD, anemia.  She has been taken off of anticoagulation.  She has had multiple visits to PCP since being admitted in December for possible GI bleed.  It sounds like her sodium and chloride have been low and her hemoglobin is dropped to a point where they felt she needed to be seen in the emergency department.  Daughter is noticed she has been a little more short of breath and weak.  Also wonders if she has a UTI.  She has not noticed any overt bleeding.  Daughter tells me that they did not want to pursue endoscopy from above or below due to her significant cardiac risk.  Patient herself denies any complaints.  They also follow with the wound clinic for her chronic leg edema and wounds on her feet  The history is provided by the patient and a relative.  Weakness Severity:  Moderate Onset quality:  Gradual Timing:  Constant Progression:  Worsening Chronicity:  Recurrent Relieved by:  Nothing Worsened by:  Activity Ineffective treatments:  Rest Associated symptoms: difficulty walking, dizziness and shortness of breath   Associated symptoms: no abdominal pain, no chest pain and no fever        Home Medications Prior to Admission medications   Medication Sig Start Date End Date Taking? Authorizing Provider  acetaminophen (TYLENOL) 325 MG tablet Take 2 tablets (650 mg total) by mouth every 6 (six) hours as needed for mild pain or headache. Patient taking differently: Take 650 mg by mouth every 6 (six) hours as needed for mild pain (pain score 1-3), headache or fever. 11/06/22   Almon Hercules, MD  ascorbic acid (VITAMIN C) 500 MG  tablet Take 500 mg by mouth See admin instructions. Pts family says that is it "sporatic"    [provider]  aspirin EC 81 MG tablet Take 1 tablet (81 mg total) by mouth daily. Swallow whole. 03/04/23 03/03/24  [name], MD  atorvastatin (LIPITOR) 10 MG tablet Take 1 tablet (10 mg total) by mouth daily. 03/04/23 03/03/24  [name], MD  bumetanide (BUMEX) 0.5 MG tablet Take 0.5 mg by mouth in the morning and at bedtime.    [provider]  carboxymethylcellulose (REFRESH PLUS) 0.5 % SOLN Place 1 drop into both eyes 2 (two) times daily.    [provider]  diclofenac Sodium (VOLTAREN) 1 % GEL Apply 4 g topically 4 (four) times daily. To bilateral feet and knees Patient taking differently: Apply 4 g topically 4 (four) times daily as needed (pain). To bilateral feet and knees 11/06/22   Candelaria Stagers T, MD  estradiol (ESTRACE) 0.1 MG/GM vaginal cream Place 1 Applicatorful vaginally daily. 04/21/23   [provider]  gabapentin (NEURONTIN) 300 MG capsule Take 300 mg by mouth 2 (two) times daily.     [provider]  levothyroxine (SYNTHROID) 100 MCG tablet Take 100 mcg by mouth daily before breakfast. 09/05/21   [provider]  Multiple Vitamins-Minerals (PRESERVISION/LUTEIN) CAPS Take 1 capsule by mouth 2 (two) times daily.     [provider]  ondansetron (ZOFRAN) 4 MG tablet Take  4 mg by mouth daily as needed. 04/17/23   [provider]  OVER THE COUNTER MEDICATION Take 1 capsule by mouth at bedtime. Equate Neuriva    [provider]  pantoprazole (PROTONIX) 40 MG tablet Take 1 tablet (40 mg total) by mouth 2 (two) times daily. 09/01/23 10/01/23  Hughie Closs, MD  senna-docusate (SENOKOT-S) 8.6-50 MG tablet Take 1 tablet by mouth 2 (two) times daily between meals as needed for mild constipation. Patient taking differently: Take 1 tablet by mouth at bedtime. 11/06/22   Almon Hercules, MD  spironolactone (ALDACTONE) 25  MG tablet TAKE 1/2 TABLET BY MOUTH EVERY DAY 09/30/23   [name], MD      Allergies    Amoxicillin, Azithromycin, Codeine, Erythromycin, Green dyes, Iodine, Misc. sulfonamide containing compounds, Oxycodone, Oxycodone-acetaminophen, Penicillins, Sulfa antibiotics, Sulfasalazine, and Tramadol    Review of Systems   Review of Systems  Constitutional:  Negative for fever.  Respiratory:  Positive for shortness of breath.   Cardiovascular:  Negative for chest pain.  Gastrointestinal:  Negative for abdominal pain.  Neurological:  Positive for dizziness and weakness.    Physical Exam Updated Vital Signs BP 137/73 (BP Location: Right Arm)   Pulse 66   Temp 98.4 F (36.9 C) (Oral)   Resp 18   Ht 5\' 3"  (1.6 m)   Wt 70.8 kg   SpO2 91%   BMI 27.63 kg/m  Physical Exam Vitals and nursing note reviewed.  Constitutional:      General: She is not in acute distress.    Appearance: Normal appearance. She is well-developed.  HENT:     Head: Normocephalic and atraumatic.  Eyes:     Conjunctiva/sclera: Conjunctivae normal.  Cardiovascular:     Rate and Rhythm: Normal rate. Rhythm irregular.     Heart sounds: Murmur heard.  Pulmonary:     Effort: Pulmonary effort is normal. No respiratory distress.     Breath sounds: Normal breath sounds.  Abdominal:     Palpations: Abdomen is soft.     Tenderness: There is no abdominal tenderness. There is no guarding or rebound.  Musculoskeletal:     Cervical back: Neck supple.     Right lower leg: Edema present.     Left lower leg: Edema present.  Skin:    General: Skin is warm and dry.     Capillary Refill: Capillary refill takes less than 2 seconds.  Neurological:     General: No focal deficit present.     Mental Status: She is alert.     ED Results / Procedures / Treatments   Labs (all labs ordered are listed, but only abnormal results are displayed) Labs Reviewed  CBC  OSMOLALITY  COMPREHENSIVE METABOLIC PANEL WITH GFR   URINALYSIS, W/ REFLEX TO CULTURE (INFECTION SUSPECTED)  MAGNESIUM  BRAIN NATRIURETIC PEPTIDE  POC OCCULT BLOOD, ED  TYPE AND SCREEN  TROPONIN I (HIGH SENSITIVITY)    EKG None  Radiology No results found.  Procedures Procedures  {Document cardiac monitor, telemetry assessment procedure when appropriate:1}  Medications Ordered in ED Medications - No data to display  ED Course/ Medical Decision Making/ A&P   {   Click here for ABCD2, HEART and other calculatorsREFRESH Note before signing :1}                              Medical Decision Making Amount and/or Complexity of Data Reviewed Labs: ordered. Radiology: ordered.  This patient complains of ***; this involves an extensive number of treatment Options and is a complaint that carries with it a high risk of complications and morbidity. The differential includes ***  I ordered, reviewed and interpreted labs, which included *** I ordered medication *** and reviewed PMP when indicated. I ordered imaging studies which included *** and I independently    visualized and interpreted imaging which showed *** Additional history obtained from *** Previous records obtained and reviewed *** I consulted *** and discussed lab and imaging findings and discussed disposition.  Cardiac monitoring reviewed, *** Social determinants considered, *** Critical Interventions: ***  After the interventions stated above, I reevaluated the patient and found *** Admission and further testing considered, ***   {Document critical care time when appropriate:1} {Document review of labs and clinical decision tools ie heart score, Chads2Vasc2 etc:1}  {Document your independent review of radiology images, and any outside records:1} {Document your discussion with family members, caretakers, and with consultants:1} {Document social determinants of health affecting pt's care:1} {Document your decision making why or why not admission, treatments were  needed:1} Final Clinical Impression(s) / ED Diagnoses Final diagnoses:  None    Rx / DC Orders ED Discharge Orders     None

## 2023-12-05 NOTE — Hospital Course (Signed)
 [name] is a 89 y.o. female with medical history significant for permanent atrial fibrillation off anticoagulation due to hx of UGI bleeding and anemia, chronic HFpEF (EF 65-70%), pulmonary HTN, moderate aortic stenosis, chronic respiratory failure with hypoxia on 2 L [place] at baseline, CKD stage IIIb, chronic hyponatremia, PAD with chronic nonhealing wounds to toes on both feet, hypothyroidism, memory impairment who is admitted with symptomatic acute on chronic anemia.

## 2023-12-05 NOTE — ED Triage Notes (Addendum)
 Pt BIB family for low hgb (7.2), low chloride, and low sodium. No bloody stool or bloody emesis.

## 2023-12-05 NOTE — H&P (Signed)
 History and Physical    [name]:[id number] DOB: October 28, 1927 DOA: 12/05/2023  PCP: Camie Patience, FNP  Patient coming from: Home  I have personally briefly reviewed patient's old medical records in [facility]  Chief Complaint: Fatigue, anemia  HPI: [name] is a 89 y.o. female with medical history significant for permanent atrial fibrillation off anticoagulation due to hx of UGI bleeding and anemia, chronic HFpEF (EF 65-70%), pulmonary HTN, moderate aortic stenosis, chronic respiratory failure with hypoxia on 2 L [place] at baseline, CKD stage IIIb, chronic hyponatremia, PAD with chronic nonhealing wounds to toes on both feet, hypothyroidism, memory impairment who presents to the ED for evaluation of fatigue, dyspnea, and anemia.  History is limited from patient and is otherwise supplement by EDP, chart review, and daughter at bedside.  Daughter states that patient has been following closely with her PCP since her recent admission in December for anemia due to suspected GI bleed.  Patient has been off full dose anticoagulation since then.  Patient has had weekly blood work over the last 3 weeks and has seen progressively declining hemoglobin.  Yesterday her hemoglobin level was 7.2.  Daughter notes that over the last couple weeks she has seemed more fatigued, generally weak, and intermittently short of breath.  She also has had increased swelling to her lower extremities.  She was noted to have a blood blister to her right anterior shin but no other obvious bleeding.  Stools have been brown.  Patient does still take aspirin and she is on Protonix twice daily.  She has not had any prior EGD or colonoscopy and is felt high risk for any type of procedure at this point given her significant cardiac history.  ED Course  Labs/Imaging on admission: I have personally reviewed following labs and imaging studies.  Initial vitals showed BP 137/73, pulse 66, RR 18, temp 98.4 F, SpO2 91% on  room air.  Labs show WBC 5.8, hemoglobin 7.2, platelets 192,000, sodium 129, potassium 3.8, bicarb 34, BUN 19, creatinine 1.21, serum glucose 115, LFTs within normal limits, magnesium 2.4, BNP 252.7, troponin 21.  FOBT is positive.  Portable chest x-ray showed cardiomegaly, pulmonary edema, small bilateral pleural effusions.  Findings similar to prior study.  Patient was ordered to receive 1 unit PRBC transfusion.  The hospitalist service was consulted to admit.  Review of Systems: All systems reviewed and are negative except as documented in history of present illness above.   Past Medical History:  Diagnosis Date   Anticoagulant long-term use    Arrhythmia    Arthritis    Atrial fibrillation (HCC)    Congestive heart failure (CHF) (HCC)    Dysphagia    Esophageal reflux    Essential hypertension    Hiatal hernia    Pulmonary embolus (HCC)    Thyroid disease     Past Surgical History:  Procedure Laterality Date   ABDOMINAL AORTOGRAM W/LOWER EXTREMITY N/A 03/04/2023   Procedure: ABDOMINAL AORTOGRAM W/LOWER EXTREMITY;  Surgeon: [name], MD;  Location: [facility] INVASIVE CV LAB;  Service: Cardiovascular;  Laterality: N/A;   AMPUTATION TOE Right 11/17/2021   Procedure: RIGHT SECOND TOE AMPUTATION;  Surgeon: [name], MD;  Location: [facility] ORS;  Service: Orthopedics;  Laterality: Right;   COLONOSCOPY     ELBOW SURGERY     LAPAROSCOPIC HYSTERECTOMY      Social History: Social History   Tobacco Use   Smoking status: Former    Current packs/day: 0.00  Types: Cigarettes    Quit date: 10/02/2001    Years since quitting: 22.1   Smokeless tobacco: Never   Tobacco comments:    former smoker  Vaping Use   Vaping status: Never Used  Substance Use Topics   Alcohol use: Never   Drug use: Never    Allergies  Allergen Reactions   Amoxicillin Other (See Comments)    Unknown reaction  Tolerates Keflex, Cefepime   Azithromycin Other (See Comments)    Unknown reaction     Codeine Other (See Comments)    Unknown reaction  Other Reaction(s): Not available, Not available, Not available, Not available, Not available, Not available, Not available   Erythromycin Other (See Comments)    Unknown reaction    Green Dyes Other (See Comments)    Allergic to ALL dyes   Iodine Other (See Comments)    Unknown reaction    Misc. Sulfonamide Containing Compounds    Oxycodone Other (See Comments)    Unknown reaction      Oxycodone-Acetaminophen Other (See Comments)    Unknown reaction    Penicillins Other (See Comments)    Unknown reaction  Tolerates Keflex, Cefepime   Sulfa Antibiotics     Per Pt's daughter - unknown reaction    Sulfasalazine     Unsure of allergy    Tramadol Hives    Family History  Problem Relation Age of Onset   Stroke Mother    Cancer Brother      Prior to Admission medications   Medication Sig Start Date End Date Taking? Authorizing Provider  acetaminophen (TYLENOL) 325 MG tablet Take 2 tablets (650 mg total) by mouth every 6 (six) hours as needed for mild pain or headache. Patient taking differently: Take 650 mg by mouth every 6 (six) hours as needed for mild pain (pain score 1-3), headache or fever. 11/06/22   Almon Hercules, MD  ascorbic acid (VITAMIN C) 500 MG tablet Take 500 mg by mouth See admin instructions. Pts family says that is it "sporatic"    [provider]  aspirin EC 81 MG tablet Take 1 tablet (81 mg total) by mouth daily. Swallow whole. 03/04/23 03/03/24  [name], MD  atorvastatin (LIPITOR) 10 MG tablet Take 1 tablet (10 mg total) by mouth daily. 03/04/23 03/03/24  [name], MD  bumetanide (BUMEX) 0.5 MG tablet Take 0.5 mg by mouth in the morning and at bedtime.    [provider]  carboxymethylcellulose (REFRESH PLUS) 0.5 % SOLN Place 1 drop into both eyes 2 (two) times daily.    [provider]  diclofenac Sodium (VOLTAREN) 1 % GEL Apply 4 g topically 4 (four) times daily. To  bilateral feet and knees Patient taking differently: Apply 4 g topically 4 (four) times daily as needed (pain). To bilateral feet and knees 11/06/22   Candelaria Stagers T, MD  estradiol (ESTRACE) 0.1 MG/GM vaginal cream Place 1 Applicatorful vaginally daily. 04/21/23   [provider]  gabapentin (NEURONTIN) 300 MG capsule Take 300 mg by mouth 2 (two) times daily.     [provider]  levothyroxine (SYNTHROID) 100 MCG tablet Take 100 mcg by mouth daily before breakfast. 09/05/21   [provider]  Multiple Vitamins-Minerals (PRESERVISION/LUTEIN) CAPS Take 1 capsule by mouth 2 (two) times daily.     [provider]  ondansetron (ZOFRAN) 4 MG tablet Take 4 mg by mouth daily as needed. 04/17/23   [provider]  OVER THE COUNTER MEDICATION  Take 1 capsule by mouth at bedtime. Equate Neuriva    [provider]  pantoprazole (PROTONIX) 40 MG tablet Take 1 tablet (40 mg total) by mouth 2 (two) times daily. 09/01/23 10/01/23  Hughie Closs, MD  senna-docusate (SENOKOT-S) 8.6-50 MG tablet Take 1 tablet by mouth 2 (two) times daily between meals as needed for mild constipation. Patient taking differently: Take 1 tablet by mouth at bedtime. 11/06/22   Almon Hercules, MD  spironolactone (ALDACTONE) 25 MG tablet TAKE 1/2 TABLET BY MOUTH EVERY DAY 09/30/23   [name], MD    Physical Exam: Vitals:   12/05/23 1730 12/05/23 1930 12/05/23 2012 12/05/23 2042  BP: (!) 178/50 (!) 181/84 (!) 194/78   Pulse: 67  80   Resp: 19 20    Temp:   (!) 97.5 F (36.4 C)   TempSrc:   Oral   SpO2: 92%  91%   Weight:    75.6 kg  Height:    5\' 3"  (1.6 m)   Constitutional: Elderly woman resting in bed with head elevated Eyes: EOMI, lids and conjunctivae normal ENMT: Mucous membranes are moist. Posterior pharynx clear of any exudate or lesions.Normal dentition.  Neck: normal, supple, no masses. Respiratory: Bibasilar inspiratory crackles.  Normal respiratory effort  while on home 2 L O2 via [place]. No accessory muscle use.  Cardiovascular: Irregularly irregular, no murmurs / rubs / gallops. No extremity edema. 2+ pedal pulses. Abdomen: no tenderness, no masses palpated. Musculoskeletal: no clubbing / cyanosis. No joint deformity upper and lower extremities. Good ROM, no contractures. Normal muscle tone.  Skin: Ecchymosis to right anterior shin.  Chronic wounds to several toes on both feet, wound dressing in place. Neurologic: Sensation intact. Strength 5/5 in all 4.  Psychiatric: Normal judgment and insight. Alert and oriented x 3. Normal mood.   EKG: Personally reviewed. Atrial fibrillation, rate 68, no acute ischemic changes.  Similar to prior.  Assessment/Plan Principal Problem:   Symptomatic anemia Active Problems:   Chronic heart failure with preserved ejection fraction (HFpEF, >= 50%) (HCC)   Permanent atrial fibrillation (HCC)   Chronic hyponatremia   Chronic kidney disease, stage 3b (HCC)   Hypercholesterolemia   Hypothyroidism   Pulmonary artery hypertension (HCC)   [name] is a 89 y.o. female with medical history significant for permanent atrial fibrillation off anticoagulation due to hx of UGI bleeding and anemia, chronic HFpEF (EF 65-70%), pulmonary HTN, moderate aortic stenosis, chronic respiratory failure with hypoxia on 2 L [place] at baseline, CKD stage IIIb, chronic hyponatremia, PAD with chronic nonhealing wounds to toes on both feet, hypothyroidism, memory impairment who is admitted with symptomatic acute on chronic anemia.  Assessment and Plan: Symptomatic acute on chronic anemia with history of UGI bleed: Hemoglobin down to 7.2 on admission compared to previous 9.0.  FOBT is persistently positive.  Suspect she has had ongoing slow GI bleed.  She has been off anticoagulation since prior admission in December but remains on low-dose aspirin.  She is not a candidate for endoscopy given her cardiac history. -Transfuse 1 unit PRBC, give  Lasix during transfusion -IV Protonix 40 mg twice daily -Hold aspirin -Repeat CBC in a.m.  Chronic HFpEF Pulmonary HTN Moderate aortic stenosis: Patient with chronic peripheral and pulmonary edema.  At risk for TACO with blood transfusion. -Continue home Bumex 0.5 mg twice daily -Continue spironolactone 12.5 mg daily -Give a dose of IV Lasix 40 mg during blood transfusion -Monitor I/O's, daily weights  Permanent atrial fibrillation: Remains in  atrial fibrillation with controlled rate.  Off of anticoagulation due to history of GI bleed and chronic anemia.  Not on rate/rhythm controlling meds as an outpatient.  Holding aspirin.  Chronic respiratory failure with hypoxia: Stable on home 2 L O2 via [place].  CKD stage IIIb: Renal function stable.  Continue to monitor.  Chronic hyponatremia: Patient with chronic hyponatremia not significantly changed from baseline.  PAD with chronic nonhealing wounds to toes on both feet: Follows closely with wound clinic.  Continue atorvastatin, holding aspirin.  Consult to wound care.  Hypothyroidism: Continue Synthroid.  Memory deficits: Seems to have chronic memory issues with possibly underlying dementia.  Placed on delirium precautions.   DVT prophylaxis: SCDs Start: 12/05/23 1954 Code Status:   Code Status: Limited: Do not attempt resuscitation (DNR) -DNR-LIMITED -Do Not Intubate/DNI confirmed with patient's daughter on admission. Family Communication: Daughter at bedside Disposition Plan: From home, dispo pending clinical progress Consults called: None Severity of Illness: The appropriate patient status for this patient is OBSERVATION. Observation status is judged to be reasonable and necessary in order to provide the required intensity of service to ensure the patient's safety. The patient's presenting symptoms, physical exam findings, and initial radiographic and laboratory data in the context of their medical condition is felt to place them  at decreased risk for further clinical deterioration. Furthermore, it is anticipated that the patient will be medically stable for discharge from the hospital within 2 midnights of admission.   Darreld Mclean MD [facility]  If 7PM-7AM, please contact night-coverage [email or web address]  12/05/2023, 8:52 PM

## 2023-12-05 NOTE — ED Notes (Signed)
 6th floor RN contacted this RN and ED charge RN stating that this pts BP and BNP was too elevated for the tele floor. This RN talked face to face with the admitting MD Darreld Mclean), and he communicated that the pt was still tele floor appropriate, and did not need to be admitted to the progressive floor. ED charge RN called the 6th floor RN and communicated the decision with them. Pt will continue to be admitted to the tele floor.

## 2023-12-06 DIAGNOSIS — K219 Gastro-esophageal reflux disease without esophagitis: Secondary | ICD-10-CM | POA: Diagnosis present

## 2023-12-06 DIAGNOSIS — S91101A Unspecified open wound of right great toe without damage to nail, initial encounter: Secondary | ICD-10-CM | POA: Diagnosis present

## 2023-12-06 DIAGNOSIS — I4821 Permanent atrial fibrillation: Secondary | ICD-10-CM | POA: Diagnosis present

## 2023-12-06 DIAGNOSIS — I350 Nonrheumatic aortic (valve) stenosis: Secondary | ICD-10-CM | POA: Diagnosis present

## 2023-12-06 DIAGNOSIS — Z7901 Long term (current) use of anticoagulants: Secondary | ICD-10-CM | POA: Diagnosis not present

## 2023-12-06 DIAGNOSIS — F0390 Unspecified dementia without behavioral disturbance: Secondary | ICD-10-CM | POA: Diagnosis present

## 2023-12-06 DIAGNOSIS — N1832 Chronic kidney disease, stage 3b: Secondary | ICD-10-CM | POA: Diagnosis present

## 2023-12-06 DIAGNOSIS — Z66 Do not resuscitate: Secondary | ICD-10-CM | POA: Diagnosis present

## 2023-12-06 DIAGNOSIS — I130 Hypertensive heart and chronic kidney disease with heart failure and stage 1 through stage 4 chronic kidney disease, or unspecified chronic kidney disease: Secondary | ICD-10-CM | POA: Diagnosis present

## 2023-12-06 DIAGNOSIS — D649 Anemia, unspecified: Secondary | ICD-10-CM | POA: Diagnosis present

## 2023-12-06 DIAGNOSIS — J449 Chronic obstructive pulmonary disease, unspecified: Secondary | ICD-10-CM | POA: Diagnosis present

## 2023-12-06 DIAGNOSIS — J9611 Chronic respiratory failure with hypoxia: Secondary | ICD-10-CM | POA: Diagnosis present

## 2023-12-06 DIAGNOSIS — S91105A Unspecified open wound of left lesser toe(s) without damage to nail, initial encounter: Secondary | ICD-10-CM | POA: Diagnosis present

## 2023-12-06 DIAGNOSIS — I2721 Secondary pulmonary arterial hypertension: Secondary | ICD-10-CM | POA: Diagnosis present

## 2023-12-06 DIAGNOSIS — X58XXXA Exposure to other specified factors, initial encounter: Secondary | ICD-10-CM | POA: Diagnosis present

## 2023-12-06 DIAGNOSIS — S91102A Unspecified open wound of left great toe without damage to nail, initial encounter: Secondary | ICD-10-CM | POA: Diagnosis present

## 2023-12-06 DIAGNOSIS — Z79890 Hormone replacement therapy (postmenopausal): Secondary | ICD-10-CM | POA: Diagnosis not present

## 2023-12-06 DIAGNOSIS — Z9981 Dependence on supplemental oxygen: Secondary | ICD-10-CM | POA: Diagnosis not present

## 2023-12-06 DIAGNOSIS — I5032 Chronic diastolic (congestive) heart failure: Secondary | ICD-10-CM | POA: Diagnosis present

## 2023-12-06 DIAGNOSIS — E039 Hypothyroidism, unspecified: Secondary | ICD-10-CM | POA: Diagnosis present

## 2023-12-06 DIAGNOSIS — I739 Peripheral vascular disease, unspecified: Secondary | ICD-10-CM | POA: Diagnosis present

## 2023-12-06 DIAGNOSIS — Z87891 Personal history of nicotine dependence: Secondary | ICD-10-CM | POA: Diagnosis not present

## 2023-12-06 DIAGNOSIS — E7800 Pure hypercholesterolemia, unspecified: Secondary | ICD-10-CM | POA: Diagnosis present

## 2023-12-06 DIAGNOSIS — S91104A Unspecified open wound of right lesser toe(s) without damage to nail, initial encounter: Secondary | ICD-10-CM | POA: Diagnosis present

## 2023-12-06 DIAGNOSIS — E871 Hypo-osmolality and hyponatremia: Secondary | ICD-10-CM | POA: Diagnosis present

## 2023-12-06 LAB — CBC
HCT: 23.7 % — ABNORMAL LOW (ref 36.0–46.0)
Hemoglobin: 7.1 g/dL — ABNORMAL LOW (ref 12.0–15.0)
MCH: 26.9 pg (ref 26.0–34.0)
MCHC: 30 g/dL (ref 30.0–36.0)
MCV: 89.8 fL (ref 80.0–100.0)
Platelets: 148 10*3/uL — ABNORMAL LOW (ref 150–400)
RBC: 2.64 MIL/uL — ABNORMAL LOW (ref 3.87–5.11)
RDW: 16.2 % — ABNORMAL HIGH (ref 11.5–15.5)
WBC: 4.2 10*3/uL (ref 4.0–10.5)
nRBC: 0 % (ref 0.0–0.2)

## 2023-12-06 LAB — URINALYSIS, W/ REFLEX TO CULTURE (INFECTION SUSPECTED)
Bacteria, UA: NONE SEEN
Bilirubin Urine: NEGATIVE
Glucose, UA: NEGATIVE mg/dL
Hgb urine dipstick: NEGATIVE
Ketones, ur: NEGATIVE mg/dL
Leukocytes,Ua: NEGATIVE
Nitrite: NEGATIVE
Protein, ur: NEGATIVE mg/dL
Specific Gravity, Urine: 1.009 (ref 1.005–1.030)
pH: 7 (ref 5.0–8.0)

## 2023-12-06 LAB — BASIC METABOLIC PANEL WITH GFR
Anion gap: 7 (ref 5–15)
BUN: 20 mg/dL (ref 8–23)
CO2: 33 mmol/L — ABNORMAL HIGH (ref 22–32)
Calcium: 8.9 mg/dL (ref 8.9–10.3)
Chloride: 89 mmol/L — ABNORMAL LOW (ref 98–111)
Creatinine, Ser: 1.18 mg/dL — ABNORMAL HIGH (ref 0.44–1.00)
GFR, Estimated: 43 mL/min — ABNORMAL LOW (ref >60)
Glucose, Bld: 86 mg/dL (ref 70–99)
Potassium: 4.1 mmol/L (ref 3.5–5.1)
Sodium: 129 mmol/L — ABNORMAL LOW (ref 135–145)

## 2023-12-06 LAB — PREPARE RBC (CROSSMATCH)

## 2023-12-06 MED ORDER — SODIUM CHLORIDE 0.9% IV SOLUTION: Freq: Once | INTRAVENOUS | Status: AC

## 2023-12-06 MED ORDER — BUMETANIDE 1 MG PO TABS
1.5000 mg | ORAL_TABLET | Freq: Every day | ORAL | Status: DC
  Filled 2023-12-06: qty 1

## 2023-12-06 MED ORDER — BUMETANIDE 1 MG PO TABS
1.5000 mg | ORAL_TABLET | Freq: Every day | ORAL | Status: DC
  Administered 2023-12-06 – 2023-12-07 (×2): 1.5 mg via ORAL
  Filled 2023-12-06 (×2): qty 2

## 2023-12-06 NOTE — Consult Note (Signed)
 WOC Nurse Consult Note: B toe wounds r/t arterial insufficiency; followed at [facility] last seen 12/02/2023 using Promogran for all wounds; Promogran is a collagen dressing that is not on formulary at [facility], will use silver hydrofiber as substitute  Reason for Consult: chronic toe wounds  Wound type: 1. Full thickness wounds to toes r/t arterial insufficiency  2.  Unstageable PI L heel 3.  L thigh full thickness 100% purple maroon discoloration likely r/t trauma  Pressure Injury POA: Yes Measurement: 1.  L 2nd toe 0.4 cm x 0.6 cm x 0.1 cm ; L great toe 0.9 cm x 1.9 cm x 0.1 cm; R great toe 0.9 cm x 1.1 cm x 0.1 cm, R 2nd toe 0.2 cm  0.8 cm x 0.1 cm per [facility] notes 12/02/2023  2.  L heel Unstageable 0.1 cm x 0.1 cm x 0.1 cm  Wound bed:1.  L great toe 50% yellow 50% pink, R great toe 100% red hemorrhagic; R 3rd toe 100% dry hemorrhagic; L 2nd toe 100% brown 2.  L heel 100% brown  3.  Purple maroon to L thigh as above  Drainage (amount, consistency, odor)  see nursing flowsheet  Periwound: largely intact, some peeling epithelium to feet  Dressing procedure/placement/frequency:  Cleanse all toe wounds with NS, apply silver hydrofiber ([employer] [place] #[id number]) cut to fit wound beds daily.  Cover with dry gauze and wrap in Kerlix roll gauze to secure.  Apply silicone foam to left heel, lift daily to assess area.  Place foot in offloading heel cups from home or Prevalon boots [place] [phone]) whichever is preferred.  Cleanse L thigh wound with NS, apply Xeroform gauze [place] [phone]) to wound bed daily cover with silicone foam.   POC discussed with bedside nurse. WOC team will not follow. Re-consult if further needs arise.   Thank you,    Priscella Mann MSN, RN-BC, [employer] [phone]

## 2023-12-06 NOTE — Evaluation (Signed)
 Physical Therapy Evaluation Patient Details Name: [name] MRN: [id number] DOB: Jun 01, 1928 Today's Date: 12/06/2023  History of Present Illness  89 y.o. female admitted 12/05/23 for symptomatic anemia, history of GI bleed. Past medical history significant of chronic use of 2 L/min of supplementary oxygen, CHF, dysphagia, hiatal hernia, arrhythmia, moderate AS, chronic nonhealing wounds bilateral feet, memory impairment, and PAF off anticoagulation due to upper GI bleed  Clinical Impression  Pt admitted with above diagnosis.  Pt currently with functional limitations due to the deficits listed below (see PT Problem List). Pt will benefit from acute skilled PT to increase their independence and safety with mobility to allow discharge.  Pt requesting to use BSC so assisted pt to/from [facility] with nurse tech.  Pt currently requiring at least mod assist at this time.  Pt poor historian however prior recent admission notes state pt from home with family assist.  If family can provide current level assist, recommend HHPT upon d/c.  If not, pt may need higher level of care.         If plan is discharge home, recommend the following: A lot of help with walking and/or transfers;A lot of help with bathing/dressing/bathroom;Assistance with cooking/housework;Help with stairs or ramp for entrance;Assist for transportation   Can travel by private vehicle        Equipment Recommendations None recommended by PT  Recommendations for Other Services       Functional Status Assessment Patient has had a recent decline in their functional status and demonstrates the ability to make significant improvements in function in a reasonable and predictable amount of time.     Precautions / Restrictions Precautions Precautions: Fall Precaution/Restrictions Comments: chronic 2L O2      Mobility  Bed Mobility Overal bed mobility: Needs Assistance Bed Mobility: Supine to Sit     Supine to sit: Min assist, HOB  elevated, Used rails     General bed mobility comments: cues for technique, light assist for trunk upright    Transfers Overall transfer level: Needs assistance Equipment used: Rolling walker (2 wheels) Transfers: Sit to/from Stand, Bed to chair/wheelchair/BSC Sit to Stand: Mod assist, +2 safety/equipment Stand pivot transfers: Mod assist, +2 safety/equipment         General transfer comment: multimodal cues for technique and safety; pt slow to mobilize; crepitus felt at shoulders; dribbled urine with pivot to BSC; BSC swapped out for recliner after pericare with standing per NT    Ambulation/Gait                  Stairs            Wheelchair Mobility     Tilt Bed    Modified Rankin (Stroke Patients Only)       Balance Overall balance assessment: Needs assistance Sitting-balance support: No upper extremity supported, Feet supported Sitting balance-Leahy Scale: Fair     Standing balance support: Bilateral upper extremity supported, During functional activity, Reliant on assistive device for balance Standing balance-Leahy Scale: Poor                               Pertinent Vitals/Pain Pain Assessment Pain Assessment: No/denies pain    Home Living Family/patient expects to be discharged to:: Private residence Living Arrangements: Children Available Help at Discharge: Family;Available 24 hours/day Type of Home: House Home Access: Ramped entrance       Home Layout: One level Home Equipment: Rollator (4 wheels);BSC/3in1  Additional Comments: living at daughter's home as above; information from previous admission    Prior Function Prior Level of Function : Patient poor historian/Family not available;Needs assist             Mobility Comments: uses 4WW, only household distances ADLs Comments: daughter or granddaughter assists with ADLs as needed     Extremity/Trunk Assessment        Lower Extremity Assessment Lower  Extremity Assessment: Generalized weakness (lower legs covered in dressings)    Cervical / Trunk Assessment Cervical / Trunk Assessment: Kyphotic;Other exceptions Cervical / Trunk Exceptions: forward head posture  Communication        Cognition Arousal: Alert Behavior During Therapy: WFL for tasks assessed/performed   PT - Cognitive impairments: History of cognitive impairments                       PT - Cognition Comments: hx memory impairment; per MD note, possible underlying dementia Following commands: Intact       Cueing Cueing Techniques: Verbal cues, Tactile cues     General Comments      Exercises     Assessment/Plan    PT Assessment Patient needs continued PT services  PT Problem List Decreased strength;Decreased activity tolerance;Decreased mobility;Decreased balance;Decreased knowledge of use of DME;Decreased cognition;Decreased skin integrity       PT Treatment Interventions DME instruction;Gait training;Balance training;Functional mobility training;Therapeutic activities;Therapeutic exercise;Patient/family education    PT Goals (Current goals can be found in the Care Plan section)  Acute Rehab PT Goals PT Goal Formulation: Patient unable to participate in goal setting Time For Goal Achievement: 12/20/23 Potential to Achieve Goals: Fair    Frequency Min 2X/week     Co-evaluation               AM-PAC PT "6 Clicks" Mobility  Outcome Measure Help needed turning from your back to your side while in a flat bed without using bedrails?: A Little Help needed moving from lying on your back to sitting on the side of a flat bed without using bedrails?: A Little Help needed moving to and from a bed to a chair (including a wheelchair)?: A Lot Help needed standing up from a chair using your arms (e.g., wheelchair or bedside chair)?: A Lot Help needed to walk in hospital room?: A Lot Help needed climbing 3-5 steps with a railing? : Total 6 Click  Score: 13    End of Session Equipment Utilized During Treatment: Gait belt Activity Tolerance: Patient tolerated treatment well Patient left: in chair;with call bell/phone within reach;with chair alarm set;with nursing/sitter in room   PT Visit Diagnosis: Difficulty in walking, not elsewhere classified (R26.2);Muscle weakness (generalized) (M62.81)    Time: 8119-1478 PT Time Calculation (min) (ACUTE ONLY): 20 min   Charges:   PT Evaluation $PT Eval Low Complexity: 1 Low   PT General Charges $$ ACUTE PT VISIT: 1 Visit       Thomasene Mohair PT, DPT Physical Therapist Acute Rehabilitation Services Office: [phone]   Janan Halter [place] 12/06/2023, 11:58 AM

## 2023-12-06 NOTE — Plan of Care (Signed)

## 2023-12-06 NOTE — Care Management Obs Status (Signed)
 MEDICARE OBSERVATION STATUS NOTIFICATION   Patient Details  Name: [name] MRN: [id number] Date of Birth: Jun 14, 1928   Medicare Observation Status Notification Given:  Yes    Diona Browner, LCSW 12/06/2023, 4:30 PM

## 2023-12-06 NOTE — Progress Notes (Signed)
 PROGRESS NOTE  [name] WUJ:[id number] DOB: March 11, 1928 DOA: 12/05/2023 PCP: Camie Patience, FNP   LOS: 0 days   Brief Narrative / Interim history: 89 year old female with PAF off anticoagulation due to upper GI bleed, anemia, chronic diastolic CHF, pulmonary hypertension, moderate AS, chronic hypoxia with 2 L at home, CKD 3B, chronic hyponatremia, PAD with chronic nonhealing wounds bilateral feet, hypothyroidism, memory impairment who comes into the hospital with fatigue, dyspnea, anemia.  She was found to have a hemoglobin of 7.1, fecal occult was positive and she was admitted to the hospital.  Of note, she had a recent hospitalization summer 2024 for acute blood loss anemia, GI saw her at that time and felt like she was too high risk for endoscopic evaluation, was treated conservatively and discharged home  Subjective / 24h Interval events: She is doing well this morning, feeling overall very fatigued but otherwise no specific complaints.  She denies any blood in her stool  Assesement and Plan: Principal Problem:   Symptomatic anemia Active Problems:   Chronic heart failure with preserved ejection fraction (HFpEF, >= 50%) (HCC)   Permanent atrial fibrillation (HCC)   Chronic hyponatremia   Chronic kidney disease, stage 3b (HCC)   Hypercholesterolemia   Hypothyroidism   Pulmonary artery hypertension (HCC)  Principal problem Symptomatic anemia, history of GI bleed -prior baseline hemoglobin was around 9.0, down to 7.2 on admission.  She received a unit of packed red blood cells without much improvement in her hemoglobin this morning.  I will transfuse a second unit of packed red blood cells.  Hold aspirin -She has no frank bleeding, hold on consulting GI for now  Active problems Pulmonary hypertension, moderate AS, chronic diastolic CHF -she has chronic edema, she is at risk for TACO with blood transfusion, closely watch while getting a second unit -Continue Bumex and  spironolactone  Permanent A-fib -rate controlled, not on any nodal agents that I can see  Hypothyroidism -continue Synthroid  Chronic hypoxic respiratory failure -stable on 2 L  CKD 3B -creatinine at baseline  PAD with chronic nonhealing wounds to toes on both feet - Follows closely with wound clinic.  Continue atorvastatin, holding aspirin.  Consult to wound care.   Memory deficits - Seems to have chronic memory issues with possibly underlying dementia.  Placed on delirium precaution  Scheduled Meds:  sodium chloride   Intravenous Once   sodium chloride   Intravenous Once   atorvastatin  10 mg Oral Daily   bumetanide  0.5 mg Oral BID   levothyroxine  100 mcg Oral QAC breakfast   pantoprazole (PROTONIX) IV  40 mg Intravenous Q12H   sodium chloride flush  3 mL Intravenous Q12H   spironolactone  12.5 mg Oral Daily   Continuous Infusions: PRN Meds:.acetaminophen **OR** acetaminophen, hydrALAZINE, ondansetron **OR** ondansetron (ZOFRAN) IV, senna-docusate  Current Outpatient Medications  Medication Instructions   acetaminophen (TYLENOL) 650 mg, Oral, Every 6 hours PRN   ascorbic acid (VITAMIN C) 500 mg, See admin instructions   aspirin EC 81 mg, Oral, Daily, Swallow whole.   atorvastatin (LIPITOR) 10 mg, Oral, Daily   bumetanide (BUMEX) 0.5 mg, 2 times daily   bumetanide (BUMEX) 1.5 mg, Daily   carboxymethylcellulose (REFRESH PLUS) 0.5 % SOLN 1 drop, 2 times daily   ciprofloxacin (CIPRO) 500 mg, 2 times daily   clotrimazole-betamethasone (LOTRISONE) cream 2 times daily PRN   diclofenac Sodium (VOLTAREN) 4 g, Topical, 4 times daily, To bilateral feet and knees   estradiol (ESTRACE) 0.1  MG/GM vaginal cream 1 Applicatorful, Daily   gabapentin (NEURONTIN) 300 mg, 2 times daily   levothyroxine (SYNTHROID) 100 mcg, Daily before breakfast   Multiple Vitamins-Minerals (PRESERVISION/LUTEIN) CAPS 1 capsule, 2 times daily   ondansetron (ZOFRAN) 4 mg, Daily PRN   OVER THE COUNTER  MEDICATION 1 capsule, Daily at bedtime   pantoprazole (PROTONIX) 40 mg, Oral, 2 times daily   senna-docusate (SENOKOT-S) 8.6-50 MG tablet 1 tablet, Oral, 2 times daily between meals PRN   sodium chloride 1 g, 2 times daily   spironolactone (ALDACTONE) 12.5 mg, Oral, Daily    Diet Orders (From admission, onward)     Start     Ordered   12/05/23 1955  Diet renal with fluid restriction Fluid restriction: 1200 mL Fluid; Room service appropriate? Yes; Fluid consistency: Thin  Diet effective now       Question Answer Comment  Fluid restriction: 1200 mL Fluid   Room service appropriate? Yes   Fluid consistency: Thin      12/05/23 1955            DVT prophylaxis: SCDs Start: 12/05/23 1954   Lab Results  Component Value Date   PLT 148 (L) 12/06/2023      Code Status: Limited: Do not attempt resuscitation (DNR) -DNR-LIMITED -Do Not Intubate/DNI   Family Communication: Will speak with the daughter when she gets here  Status is: Observation The patient will require care spanning > 2 midnights and should be moved to inpatient because: Persistent symptomatic anemia   Level of care: Telemetry  Consultants:  None  Objective: Vitals:   12/05/23 2320 12/05/23 2337 12/06/23 0213 12/06/23 0504  BP: (!) 141/39 (!) 148/53 (!) 151/58 (!) 133/49  Pulse: 67 72 66 67  Resp: 18  20 (!) 24  Temp: 97.9 F (36.6 C) 98.1 F (36.7 C) 97.9 F (36.6 C) 97.8 F (36.6 C)  TempSrc: Oral Oral Oral   SpO2: 99% 100% 100% 98%  Weight:    77.1 kg  Height:        Intake/Output Summary (Last 24 hours) at 12/06/2023 0949 Last data filed at 12/06/2023 0529 Gross per 24 hour  Intake 822 ml  Output 300 ml  Net 522 ml   Wt Readings from Last 3 Encounters:  12/06/23 77.1 kg  10/09/23 70.8 kg  08/27/23 70 kg    Examination:  Constitutional: NAD Eyes: no scleral icterus ENMT: Mucous membranes are moist.  Neck: normal, supple Respiratory: clear to auscultation bilaterally, no wheezing, no  crackles.  Cardiovascular: Regular rate and rhythm, no murmurs / rubs / gallops. No LE edema.  Abdomen: non distended, no tenderness. Bowel sounds positive.  Musculoskeletal: no clubbing / cyanosis.   Data Reviewed: I have independently reviewed following labs and imaging studies   CBC Recent Labs  Lab 12/05/23 1645 12/06/23 0519  WBC 5.8 4.2  HGB 7.2* 7.1*  HCT 24.2* 23.7*  PLT 192 148*  MCV 87.4 89.8  MCH 26.0 26.9  MCHC 29.8* 30.0  RDW 16.4* 16.2*    Recent Labs  Lab 12/05/23 1645 12/06/23 0519  NA 129* 129*  K 3.8 4.1  CL 87* 89*  CO2 34* 33*  GLUCOSE 115* 86  BUN 19 20  CREATININE 1.21* 1.18*  CALCIUM 9.2 8.9  AST 27  --   ALT 12  --   ALKPHOS 106  --   BILITOT 0.7  --   ALBUMIN 3.2*  --   MG 2.4  --   BNP 252.7*  --     ------------------------------------------------------------------------------------------------------------------  No results for input(s): "CHOL", "HDL", "LDLCALC", "TRIG", "CHOLHDL", "LDLDIRECT" in the last 72 hours.  No results found for: "HGBA1C" ------------------------------------------------------------------------------------------------------------------ No results for input(s): "TSH", "T4TOTAL", "T3FREE", "THYROIDAB" in the last 72 hours.  Invalid input(s): "FREET3"  Cardiac Enzymes No results for input(s): "CKMB", "TROPONINI", "MYOGLOBIN" in the last 168 hours.  Invalid input(s): "CK" ------------------------------------------------------------------------------------------------------------------    Component Value Date/Time   BNP 252.7 (H) 12/05/2023 1645    CBG: Recent Labs  Lab 12/05/23 2150  GLUCAP 105*    No results found for this or any previous visit (from the past 240 hours).   Radiology Studies: DG Chest Port 1 View Result Date: 12/05/2023 CLINICAL DATA:  Shortness of breath EXAM: PORTABLE CHEST 1 VIEW COMPARISON:  11/13/2023 FINDINGS: Cardiomegaly, vascular congestion. Diffuse interstitial prominence  and bilateral lower lobe airspace opacities, most compatible with edema/CHF. Small bilateral pleural effusions. Findings are similar to prior study. IMPRESSION: Mild CHF with small bilateral effusions.  No real change. Electronically Signed   By: Charlett Nose M.D.   On: 12/05/2023 17:28     Pamella Pert, MD, PhD [facility]  Between 7 am - 7 pm I am available, please contact me via Amion (for emergencies) or Securechat (non urgent messages)  Between 7 pm - 7 am I am not available, please contact night coverage MD/APP via Amion

## 2023-12-07 DIAGNOSIS — D649 Anemia, unspecified: Secondary | ICD-10-CM | POA: Diagnosis not present

## 2023-12-07 LAB — TYPE AND SCREEN
ABO/RH(D): A POS
Antibody Screen: NEGATIVE
Unit division: 0
Unit division: 0

## 2023-12-07 LAB — COMPREHENSIVE METABOLIC PANEL WITH GFR
ALT: 10 U/L (ref 0–44)
AST: 24 U/L (ref 15–41)
Albumin: 2.9 g/dL — ABNORMAL LOW (ref 3.5–5.0)
Alkaline Phosphatase: 94 U/L (ref 38–126)
Anion gap: 8 (ref 5–15)
BUN: 17 mg/dL (ref 8–23)
CO2: 33 mmol/L — ABNORMAL HIGH (ref 22–32)
Calcium: 9 mg/dL (ref 8.9–10.3)
Chloride: 88 mmol/L — ABNORMAL LOW (ref 98–111)
Creatinine, Ser: 1.3 mg/dL — ABNORMAL HIGH (ref 0.44–1.00)
GFR, Estimated: 38 mL/min — ABNORMAL LOW (ref >60)
Glucose, Bld: 97 mg/dL (ref 70–99)
Potassium: 3.8 mmol/L (ref 3.5–5.1)
Sodium: 129 mmol/L — ABNORMAL LOW (ref 135–145)
Total Bilirubin: 1.7 mg/dL — ABNORMAL HIGH (ref 0.0–1.2)
Total Protein: 6.2 g/dL — ABNORMAL LOW (ref 6.5–8.1)

## 2023-12-07 LAB — BPAM RBC
Blood Product Expiration Date: 202504262359
Blood Product Expiration Date: 202504222359
ISSUE DATE / TIME: 202503301311
ISSUE DATE / TIME: 202503292315
Unit Type and Rh: 6200
Unit Type and Rh: 6200

## 2023-12-07 LAB — CBC
HCT: 27 % — ABNORMAL LOW (ref 36.0–46.0)
Hemoglobin: 8.1 g/dL — ABNORMAL LOW (ref 12.0–15.0)
MCH: 26.6 pg (ref 26.0–34.0)
MCHC: 30 g/dL (ref 30.0–36.0)
MCV: 88.8 fL (ref 80.0–100.0)
Platelets: 137 10*3/uL — ABNORMAL LOW (ref 150–400)
RBC: 3.04 MIL/uL — ABNORMAL LOW (ref 3.87–5.11)
RDW: 16.8 % — ABNORMAL HIGH (ref 11.5–15.5)
WBC: 3.7 10*3/uL — ABNORMAL LOW (ref 4.0–10.5)
nRBC: 0 % (ref 0.0–0.2)

## 2023-12-07 LAB — MAGNESIUM: Magnesium: 2.2 mg/dL (ref 1.7–2.4)

## 2023-12-07 MED ORDER — HYDRALAZINE HCL 10 MG PO TABS: 10.0000 mg | ORAL_TABLET | Freq: Four times a day (QID) | ORAL | Status: DC | PRN

## 2023-12-07 NOTE — TOC Transition Note (Signed)
 Transition of Care [facility]) - Discharge Note  Patient Details  Name: [name] MRN: [id number] Date of Birth: 05/25/1928  Transition of Care [employer] [facility] [employer] [facility]) CM/SW Contact:  Ewing Schlein, LCSW Phone Number: 12/07/2023, 3:13 PM  Clinical Narrative: HH is recommended at discharge (PT/OT/RN). Daughter requested [facility] as the patient has used the agency before. HH orders placed by hospitalist. CSW made [facility] referral to [facility] with [facility], which was accepted. TOC signing off.  Final next level of care: Home w Home Health Services Barriers to Discharge: Barriers Resolved  Patient Goals and CMS Choice Patient states their goals for this hospitalization and ongoing recovery are:: Resume HH with [facility] CMS Medicare.gov Compare Post Acute Care list provided to:: Patient Represenative (must comment) Choice offered to / list presented to : Adult Children  Discharge Plan and Services Additional resources added to the After Visit Summary for           DME Arranged: N/A DME Agency: NA HH Arranged: PT, OT, RN HH Agency: Other - See comment ([facility]) Date HH Agency Contacted: 12/07/23 Time HH Agency Contacted: 1413 Representative spoke with at [facility] Agency: Lora Havens  Social Drivers of Health (SDOH) Interventions SDOH Screenings   Food Insecurity: No Food Insecurity (12/05/2023)  Housing: Low Risk  (12/05/2023)  Transportation Needs: No Transportation Needs (12/05/2023)  Utilities: Not At Risk (12/05/2023)  Financial Resource Strain: Low Risk  (10/09/2023)  Social Connections: Moderately Isolated (12/05/2023)  Tobacco Use: Medium Risk (12/05/2023)  Health Literacy: Inadequate Health Literacy (10/09/2023)   Readmission Risk Interventions    11/18/2021   12:55 PM  Readmission Risk Prevention Plan  Transportation Screening Complete  PCP or Specialist Appt within 3-5 Days Complete  HRI or Home Care Consult Complete  Social Work Consult for Recovery Care Planning/Counseling Complete  Palliative Care Screening  Complete  Medication Review [employer]) Complete

## 2023-12-07 NOTE — Progress Notes (Signed)
 Patient received discharge orders to go home with hospice. Patient and patient's daughter were given discharge paperwork/instructions. RN went over discharge paperwork/instructions with the patient's daughter. All questions/concerns were addressed/answered during this time to the best of RN's ability. Patient left the hospital stable on oxygen with daughter, had discharge paperwork/instructions, and had all personal belongings.

## 2023-12-07 NOTE — Progress Notes (Signed)
 Arrived for IV start per IV team consult request.  Patient is currently in the chair.  Secure chat to nurse Hillary to notify IV team once patient is back in bed.  Per [facility] patient has received the IV medications that are needed at this time.

## 2023-12-07 NOTE — Discharge Summary (Signed)
 Physician Discharge Summary  Megan KURKOWSKI AOZ:[id number] DOB: April 18, 1928 DOA: 12/05/2023  PCP: Camie Patience, FNP  Admit date: 12/05/2023 Discharge date: 12/07/2023  Admitted From: home Disposition:  home with hospice  Recommendations for Outpatient Follow-up:  Follow up with PCP in 1-2 weeks  Home Health: none Equipment/Devices: none  Discharge Condition: stable CODE STATUS: DNR Diet Orders (From admission, onward)     Start     Ordered   12/05/23 1955  Diet renal with fluid restriction Fluid restriction: 1200 mL Fluid; Room service appropriate? Yes; Fluid consistency: Thin  Diet effective now       Question Answer Comment  Fluid restriction: 1200 mL Fluid   Room service appropriate? Yes   Fluid consistency: Thin      12/05/23 1955           HPI: Per admitting MD, Megan SHEAFFER is a 89 y.o. female with medical history significant for permanent atrial fibrillation off anticoagulation due to hx of UGI bleeding and anemia, chronic HFpEF (EF 65-70%), pulmonary HTN, moderate aortic stenosis, chronic respiratory failure with hypoxia on 2 L [place] at baseline, CKD stage IIIb, chronic hyponatremia, PAD with chronic nonhealing wounds to toes on both feet, hypothyroidism, memory impairment who presents to the ED for evaluation of fatigue, dyspnea, and anemia.  History is limited from patient and is otherwise supplement by EDP, chart review, and daughter at bedside. Daughter states that patient has been following closely with her PCP since her recent admission in December for anemia due to suspected GI bleed.  Patient has been off full dose anticoagulation since then. Patient has had weekly blood work over the last 3 weeks and has seen progressively declining hemoglobin.  Yesterday her hemoglobin level was 7.2.  Daughter notes that over the last couple weeks she has seemed more fatigued, generally weak, and intermittently short of breath.  She also has had increased swelling to her lower  extremities.  She was noted to have a blood blister to her right anterior shin but no other obvious bleeding.  Stools have been brown. Patient does still take aspirin and she is on Protonix twice daily.  She has not had any prior EGD or colonoscopy and is felt high risk for any type of procedure at this point given her significant cardiac history.  Hospital Course / Discharge diagnoses: Principal Problem:   Symptomatic anemia Active Problems:   Chronic heart failure with preserved ejection fraction (HFpEF, >= 50%) (HCC)   Permanent atrial fibrillation (HCC)   Chronic hyponatremia   Chronic kidney disease, stage 3b (HCC)   Hypercholesterolemia   Hypothyroidism   Pulmonary artery hypertension (HCC)  Principal problem Symptomatic anemia, history of GI bleed -prior baseline hemoglobin was around 9.0, down to 7.2 on admission.  Patient was without any frank bleeding but fecal occult was positive.  She was evaluated by gastroenterology last December, and was deemed too high risk for any endoscopic procedure, and this was corroborated by her outpatient cardiologist.  She was transfused 2 units of packed red blood cells, hemoglobin improved and she is feeling overall stronger.  She will be discharged home in stable condition, recommended this point to hold aspirin and recheck hemoglobin within the next week or 2 as an outpatient.  She has no evidence of frank bleeding  Active problems Pulmonary hypertension, moderate AS, chronic diastolic CHF -she has chronic edema, she is at risk for TACO with blood transfusion, volume status is stable, continue home diuretics upon discharge  Permanent A-fib -rate controlled, not on any nodal agents Hypothyroidism -continue Synthroid Chronic hypoxic respiratory failure -stable on 2 L CKD 3B -creatinine at baseline PAD with chronic nonhealing wounds to toes on both feet - Follows closely with wound clinic.  Continue atorvastatin, holding aspirin.  Consult to wound  care. Memory deficits - Seems to have chronic memory issues with possibly underlying dementia.    Sepsis ruled out  Discharge Instructions   Allergies as of 12/07/2023       Reactions   Amoxicillin Other (See Comments)   Unknown reaction  Tolerates Keflex, Cefepime   Azithromycin Other (See Comments)   Unknown reaction    Codeine Other (See Comments)   Unknown reaction   Erythromycin Other (See Comments)   Unknown reaction    Green Dyes Other (See Comments)   Allergic to ALL dyes   Iodine Other (See Comments)   Unknown reaction    Misc. Sulfonamide Containing Compounds Other (See Comments)   Unknown    Oxycodone Other (See Comments)   Unknown reaction    Oxycodone-acetaminophen Other (See Comments)   Unknown reaction    Penicillins Other (See Comments)   Unknown reaction  Tolerates Keflex, Cefepime   Sulfa Antibiotics    Per Pt's daughter - unknown reaction    Sulfasalazine    Unsure of allergy    Tramadol Hives        Medication List     STOP taking these medications    aspirin EC 81 MG tablet   ciprofloxacin 500 MG tablet Commonly known as: CIPRO       TAKE these medications    acetaminophen 325 MG tablet Commonly known as: TYLENOL Take 2 tablets (650 mg total) by mouth every 6 (six) hours as needed for mild pain or headache. What changed:  when to take this reasons to take this   ascorbic acid 500 MG tablet Commonly known as: VITAMIN C Take 500 mg by mouth 3 (three) times a week.   atorvastatin 10 MG tablet Commonly known as: Lipitor Take 1 tablet (10 mg total) by mouth daily.   bumetanide 1 MG tablet Commonly known as: BUMEX Take 1 mg by mouth 2 (two) times daily. What changed: Another medication with the same name was removed. Continue taking this medication, and follow the directions you see here.   carboxymethylcellulose 0.5 % Soln Commonly known as: REFRESH PLUS Place 1 drop into both eyes 2 (two) times daily.    clotrimazole-betamethasone cream Commonly known as: LOTRISONE Apply 1 Application topically 2 (two) times daily as needed (tears on skin).   cyanocobalamin 1000 MCG tablet Commonly known as: VITAMIN B12 Take 1,000 mcg by mouth daily.   diclofenac Sodium 1 % Gel Commonly known as: VOLTAREN Apply 4 g topically 4 (four) times daily. To bilateral feet and knees What changed:  when to take this reasons to take this   estradiol 0.1 MG/GM vaginal cream Commonly known as: ESTRACE Place 1 Applicatorful vaginally every other day.   ferrous sulfate 325 (65 FE) MG EC tablet Take 325 mg by mouth 3 (three) times a week.   gabapentin 300 MG capsule Commonly known as: NEURONTIN Take 300 mg by mouth 2 (two) times daily.   levothyroxine 100 MCG tablet Commonly known as: SYNTHROID Take 100 mcg by mouth daily before breakfast.   mupirocin cream 2 % Commonly known as: BACTROBAN Apply 1 Application topically daily as needed (dry nose).   ondansetron 4 MG tablet Commonly known as: ZOFRAN Take  4 mg by mouth daily as needed.   OVER THE COUNTER MEDICATION Take 1 capsule by mouth at bedtime. Equate Neuriva   pantoprazole 40 MG tablet Commonly known as: PROTONIX Take 1 tablet (40 mg total) by mouth 2 (two) times daily.   PreserVision/Lutein Caps Take 1 capsule by mouth 2 (two) times daily.   senna-docusate 8.6-50 MG tablet Commonly known as: Senokot-S Take 1 tablet by mouth 2 (two) times daily between meals as needed for mild constipation. What changed: when to take this   sodium chloride 1 g tablet Take 1 g by mouth 2 (two) times daily.   spironolactone 25 MG tablet Commonly known as: ALDACTONE TAKE 1/2 TABLET BY MOUTH EVERY DAY       Consultations: none  Procedures/Studies: none  DG Chest Port 1 View Result Date: 12/05/2023 CLINICAL DATA:  Shortness of breath EXAM: PORTABLE CHEST 1 VIEW COMPARISON:  11/13/2023 FINDINGS: Cardiomegaly, vascular congestion. Diffuse  interstitial prominence and bilateral lower lobe airspace opacities, most compatible with edema/CHF. Small bilateral pleural effusions. Findings are similar to prior study. IMPRESSION: Mild CHF with small bilateral effusions.  No real change. Electronically Signed   By: Charlett Nose M.D.   On: 12/05/2023 17:28   OCT, Retina - OU - Both Eyes Result Date: 11/17/2023 Right Eye Quality was good. Central Foveal Thickness: 293. Progression has worsened. Findings include normal foveal contour, no IRF, no SRF, retinal drusen , outer retinal atrophy (Mild interval progression of perifoveal ORA/GA -- seen best on en face image). Left Eye Quality was good. Central Foveal Thickness: 293. Progression has worsened. Findings include normal foveal contour, no IRF, no SRF, retinal drusen , intraretinal hyper-reflective material, subretinal fluid, outer retinal atrophy (Mild progression of ORA/GA greatest superior and nasal mac -- seen best on en face image). Notes *Images captured and stored on drive Diagnosis / Impression: Nonexudative ARMD OU -- No IRF/SRF OU OD: interval progression of perifoveal ORA/GA -- seen best on en face images OS: Mild progression of ORA/GA greatest superior and nasal mac -- seen best on en face images Clinical management: See below Abbreviations: NFP - Normal foveal profile. CME - cystoid macular edema. PED - pigment epithelial detachment. IRF - intraretinal fluid. SRF - subretinal fluid. EZ - ellipsoid zone. ERM - epiretinal membrane. ORA - outer retinal atrophy. ORT - outer retinal tubulation. SRHM - subretinal hyper-reflective material. IRHM - intraretinal hyper-reflective material   DG Chest 2 View Result Date: 11/16/2023 CLINICAL DATA:  Shortness of breath and hypoxia. EXAM: CHEST - 2 VIEW COMPARISON:  Chest x-ray dated August 27, 2023. FINDINGS: Chronic cardiomegaly. Mild pulmonary vascular congestion. Small bilateral pleural effusions and bibasilar atelectasis. No pneumothorax. No acute  osseous abnormality. IMPRESSION: 1. Mild congestive heart failure. Electronically Signed   By: Obie Dredge M.D.   On: 11/16/2023 12:13     Subjective: - no chest pain, shortness of breath, no abdominal pain, nausea or vomiting.   Discharge Exam: BP (!) 151/65   Pulse 65   Temp 98.1 F (36.7 C) (Oral)   Resp 20   Ht 5\' 3"  (1.6 m)   Wt 77.1 kg   SpO2 98%   BMI 30.11 kg/m   General: Pt is alert, awake, not in acute distress Cardiovascular: RRR, S1/S2 +, no rubs, no gallops Respiratory: CTA bilaterally, no wheezing, no rhonchi Abdominal: Soft, NT, ND, bowel sounds + Extremities: no edema, no cyanosis   The results of significant diagnostics from this hospitalization (including imaging, microbiology, ancillary and laboratory) are  listed below for reference.     Microbiology: No results found for this or any previous visit (from the past 240 hours).   Labs: Basic Metabolic Panel: Recent Labs  Lab 12/05/23 1645 12/06/23 0519 12/07/23 0327  NA 129* 129* 129*  K 3.8 4.1 3.8  CL 87* 89* 88*  CO2 34* 33* 33*  GLUCOSE 115* 86 97  BUN 19 20 17   CREATININE 1.21* 1.18* 1.30*  CALCIUM 9.2 8.9 9.0  MG 2.4  --  2.2   Liver Function Tests: Recent Labs  Lab 12/05/23 1645 12/07/23 0327  AST 27 24  ALT 12 10  ALKPHOS 106 94  BILITOT 0.7 1.7*  PROT 6.9 6.2*  ALBUMIN 3.2* 2.9*   CBC: Recent Labs  Lab 12/05/23 1645 12/06/23 0519 12/07/23 0327  WBC 5.8 4.2 3.7*  HGB 7.2* 7.1* 8.1*  HCT 24.2* 23.7* 27.0*  MCV 87.4 89.8 88.8  PLT 192 148* 137*   CBG: Recent Labs  Lab 12/05/23 2150  GLUCAP 105*   Hgb A1c No results for input(s): "HGBA1C" in the last 72 hours. Lipid Profile No results for input(s): "CHOL", "HDL", "LDLCALC", "TRIG", "CHOLHDL", "LDLDIRECT" in the last 72 hours. Thyroid function studies No results for input(s): "TSH", "T4TOTAL", "T3FREE", "THYROIDAB" in the last 72 hours.  Invalid input(s): "FREET3" Urinalysis    Component Value Date/Time    COLORURINE YELLOW 12/06/2023 0114   APPEARANCEUR CLEAR 12/06/2023 0114   LABSPEC 1.009 12/06/2023 0114   PHURINE 7.0 12/06/2023 0114   GLUCOSEU NEGATIVE 12/06/2023 0114   HGBUR NEGATIVE 12/06/2023 0114   BILIRUBINUR NEGATIVE 12/06/2023 0114   KETONESUR NEGATIVE 12/06/2023 0114   PROTEINUR NEGATIVE 12/06/2023 0114   NITRITE NEGATIVE 12/06/2023 0114   LEUKOCYTESUR NEGATIVE 12/06/2023 0114    FURTHER DISCHARGE INSTRUCTIONS:   Get Medicines reviewed and adjusted: Please take all your medications with you for your next visit with your Primary MD   Laboratory/radiological data: Please request your Primary MD to go over all hospital tests and procedure/radiological results at the follow up, please ask your Primary MD to get all Hospital records sent to his/her office.   In some cases, they will be blood work, cultures and biopsy results pending at the time of your discharge. Please request that your primary care M.D. goes through all the records of your hospital data and follows up on these results.   Also Note the following: If you experience worsening of your admission symptoms, develop shortness of breath, life threatening emergency, suicidal or homicidal thoughts you must seek medical attention immediately by calling 911 or calling your MD immediately  if symptoms less severe.   You must read complete instructions/literature along with all the possible adverse reactions/side effects for all the Medicines you take and that have been prescribed to you. Take any new Medicines after you have completely understood and accpet all the possible adverse reactions/side effects.    Do not drive when taking Pain medications or sleeping medications (Benzodaizepines)   Do not take more than prescribed Pain, Sleep and Anxiety Medications. It is not advisable to combine anxiety,sleep and pain medications without talking with your primary care practitioner   Special Instructions: If you have smoked or  chewed Tobacco  in the last 2 yrs please stop smoking, stop any regular Alcohol  and or any Recreational drug use.   Wear Seat belts while driving.   Please note: You were cared for by a hospitalist during your hospital stay. Once you are discharged, your primary care physician  will handle any further medical issues. Please note that NO REFILLS for any discharge medications will be authorized once you are discharged, as it is imperative that you return to your primary care physician (or establish a relationship with a primary care physician if you do not have one) for your post hospital discharge needs so that they can reassess your need for medications and monitor your lab values.  Time coordinating discharge: 40 minutes  SIGNED:  Pamella Pert, MD, PhD 12/07/2023, 1:14 PM

## 2023-12-08 ENCOUNTER — Other Ambulatory Visit (INDEPENDENT_AMBULATORY_CARE_PROVIDER_SITE_OTHER): Payer: Self-pay | Admitting: Ophthalmology

## 2023-12-18 ENCOUNTER — Other Ambulatory Visit: Payer: Self-pay | Admitting: Vascular Surgery

## 2023-12-27 ENCOUNTER — Other Ambulatory Visit (INDEPENDENT_AMBULATORY_CARE_PROVIDER_SITE_OTHER): Payer: Self-pay | Admitting: Ophthalmology

## 2023-12-30 ENCOUNTER — Encounter (HOSPITAL_BASED_OUTPATIENT_CLINIC_OR_DEPARTMENT_OTHER): Attending: General Surgery | Admitting: General Surgery

## 2023-12-30 DIAGNOSIS — L97812 Non-pressure chronic ulcer of other part of right lower leg with fat layer exposed: Secondary | ICD-10-CM | POA: Insufficient documentation

## 2023-12-30 DIAGNOSIS — L89623 Pressure ulcer of left heel, stage 3: Secondary | ICD-10-CM | POA: Insufficient documentation

## 2023-12-30 DIAGNOSIS — L97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 without evidence of necrosis: Secondary | ICD-10-CM | POA: Diagnosis not present

## 2023-12-30 DIAGNOSIS — I5032 Chronic diastolic (congestive) heart failure: Secondary | ICD-10-CM | POA: Insufficient documentation

## 2023-12-30 DIAGNOSIS — Z89421 Acquired absence of other right toe(s): Secondary | ICD-10-CM | POA: Insufficient documentation

## 2023-12-30 DIAGNOSIS — I872 Venous insufficiency (chronic) (peripheral): Secondary | ICD-10-CM | POA: Diagnosis not present

## 2023-12-30 DIAGNOSIS — I739 Peripheral vascular disease, unspecified: Secondary | ICD-10-CM | POA: Diagnosis not present

## 2023-12-30 DIAGNOSIS — N1832 Chronic kidney disease, stage 3b: Secondary | ICD-10-CM | POA: Insufficient documentation

## 2023-12-30 DIAGNOSIS — L97526 Non-pressure chronic ulcer of other part of left foot with bone involvement without evidence of necrosis: Secondary | ICD-10-CM | POA: Insufficient documentation

## 2023-12-30 DIAGNOSIS — G609 Hereditary and idiopathic neuropathy, unspecified: Secondary | ICD-10-CM | POA: Insufficient documentation

## 2024-01-26 ENCOUNTER — Encounter (INDEPENDENT_AMBULATORY_CARE_PROVIDER_SITE_OTHER): Payer: Self-pay

## 2024-01-27 ENCOUNTER — Encounter (HOSPITAL_BASED_OUTPATIENT_CLINIC_OR_DEPARTMENT_OTHER): Attending: General Surgery | Admitting: General Surgery

## 2024-01-27 DIAGNOSIS — I872 Venous insufficiency (chronic) (peripheral): Secondary | ICD-10-CM | POA: Insufficient documentation

## 2024-01-27 DIAGNOSIS — Z7901 Long term (current) use of anticoagulants: Secondary | ICD-10-CM | POA: Insufficient documentation

## 2024-01-27 DIAGNOSIS — I4891 Unspecified atrial fibrillation: Secondary | ICD-10-CM | POA: Insufficient documentation

## 2024-01-27 DIAGNOSIS — I739 Peripheral vascular disease, unspecified: Secondary | ICD-10-CM | POA: Insufficient documentation

## 2024-01-27 DIAGNOSIS — L97526 Non-pressure chronic ulcer of other part of left foot with bone involvement without evidence of necrosis: Secondary | ICD-10-CM | POA: Insufficient documentation

## 2024-01-27 DIAGNOSIS — N1832 Chronic kidney disease, stage 3b: Secondary | ICD-10-CM | POA: Insufficient documentation

## 2024-01-27 DIAGNOSIS — L97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 without evidence of necrosis: Secondary | ICD-10-CM | POA: Diagnosis not present

## 2024-01-27 DIAGNOSIS — I129 Hypertensive chronic kidney disease with stage 1 through stage 4 chronic kidney disease, or unspecified chronic kidney disease: Secondary | ICD-10-CM | POA: Diagnosis not present

## 2024-01-27 DIAGNOSIS — I5032 Chronic diastolic (congestive) heart failure: Secondary | ICD-10-CM | POA: Diagnosis not present

## 2024-01-27 DIAGNOSIS — L89623 Pressure ulcer of left heel, stage 3: Secondary | ICD-10-CM | POA: Insufficient documentation

## 2024-01-30 ENCOUNTER — Other Ambulatory Visit: Payer: Self-pay

## 2024-01-30 ENCOUNTER — Encounter (HOSPITAL_COMMUNITY): Payer: Self-pay | Admitting: Radiology

## 2024-01-30 ENCOUNTER — Emergency Department (HOSPITAL_COMMUNITY)
Admission: EM | Admit: 2024-01-30 | Discharge: 2024-01-31 | Disposition: A | Discharge status: Home / Self Care | Attending: Emergency Medicine | Admitting: Emergency Medicine

## 2024-01-30 ENCOUNTER — Emergency Department (HOSPITAL_COMMUNITY)

## 2024-01-30 DIAGNOSIS — R519 Headache, unspecified: Secondary | ICD-10-CM | POA: Diagnosis present

## 2024-01-30 DIAGNOSIS — M542 Cervicalgia: Secondary | ICD-10-CM | POA: Diagnosis not present

## 2024-01-30 DIAGNOSIS — D649 Anemia, unspecified: Secondary | ICD-10-CM

## 2024-01-30 DIAGNOSIS — I509 Heart failure, unspecified: Secondary | ICD-10-CM | POA: Insufficient documentation

## 2024-01-30 DIAGNOSIS — I110 Hypertensive heart disease with heart failure: Secondary | ICD-10-CM | POA: Diagnosis not present

## 2024-01-30 DIAGNOSIS — D696 Thrombocytopenia, unspecified: Secondary | ICD-10-CM

## 2024-01-30 DIAGNOSIS — Z79899 Other long term (current) drug therapy: Secondary | ICD-10-CM | POA: Insufficient documentation

## 2024-01-30 DIAGNOSIS — F0390 Unspecified dementia without behavioral disturbance: Secondary | ICD-10-CM | POA: Insufficient documentation

## 2024-01-30 DIAGNOSIS — R17 Unspecified jaundice: Secondary | ICD-10-CM

## 2024-01-30 DIAGNOSIS — N289 Disorder of kidney and ureter, unspecified: Secondary | ICD-10-CM

## 2024-01-30 DIAGNOSIS — K6289 Other specified diseases of anus and rectum: Secondary | ICD-10-CM

## 2024-01-30 DIAGNOSIS — E871 Hypo-osmolality and hyponatremia: Secondary | ICD-10-CM

## 2024-01-30 LAB — CBC WITH DIFFERENTIAL/PLATELET
Abs Immature Granulocytes: 0.1 10*3/uL — ABNORMAL HIGH (ref 0.00–0.07)
Basophils Absolute: 0 10*3/uL (ref 0.0–0.1)
Basophils Relative: 0 %
Eosinophils Absolute: 0 10*3/uL (ref 0.0–0.5)
Eosinophils Relative: 0 %
HCT: 31.1 % — ABNORMAL LOW (ref 36.0–46.0)
Hemoglobin: 9.5 g/dL — ABNORMAL LOW (ref 12.0–15.0)
Immature Granulocytes: 1 %
Lymphocytes Relative: 1 %
Lymphs Abs: 0.2 10*3/uL — ABNORMAL LOW (ref 0.7–4.0)
MCH: 27.3 pg (ref 26.0–34.0)
MCHC: 30.5 g/dL (ref 30.0–36.0)
MCV: 89.4 fL (ref 80.0–100.0)
Monocytes Absolute: 0.2 10*3/uL (ref 0.1–1.0)
Monocytes Relative: 1 %
Neutro Abs: 15.9 10*3/uL — ABNORMAL HIGH (ref 1.7–7.7)
Neutrophils Relative %: 97 %
Platelets: 126 10*3/uL — ABNORMAL LOW (ref 150–400)
RBC: 3.48 MIL/uL — ABNORMAL LOW (ref 3.87–5.11)
RDW: 19.2 % — ABNORMAL HIGH (ref 11.5–15.5)
WBC: 16.4 10*3/uL — ABNORMAL HIGH (ref 4.0–10.5)
nRBC: 0 % (ref 0.0–0.2)

## 2024-01-30 LAB — COMPREHENSIVE METABOLIC PANEL WITH GFR
ALT: 13 U/L (ref 0–44)
AST: 44 U/L — ABNORMAL HIGH (ref 15–41)
Albumin: 2.8 g/dL — ABNORMAL LOW (ref 3.5–5.0)
Alkaline Phosphatase: 99 U/L (ref 38–126)
Anion gap: 11 (ref 5–15)
BUN: 20 mg/dL (ref 8–23)
CO2: 29 mmol/L (ref 22–32)
Calcium: 9.2 mg/dL (ref 8.9–10.3)
Chloride: 93 mmol/L — ABNORMAL LOW (ref 98–111)
Creatinine, Ser: 1.16 mg/dL — ABNORMAL HIGH (ref 0.44–1.00)
GFR, Estimated: 44 mL/min — ABNORMAL LOW (ref >60)
Glucose, Bld: 116 mg/dL — ABNORMAL HIGH (ref 70–99)
Potassium: 4.1 mmol/L (ref 3.5–5.1)
Sodium: 133 mmol/L — ABNORMAL LOW (ref 135–145)
Total Bilirubin: 1.3 mg/dL — ABNORMAL HIGH (ref 0.0–1.2)
Total Protein: 6.7 g/dL (ref 6.5–8.1)

## 2024-01-30 LAB — URINALYSIS, ROUTINE W REFLEX MICROSCOPIC
Bilirubin Urine: NEGATIVE
Glucose, UA: NEGATIVE mg/dL
Hgb urine dipstick: NEGATIVE
Ketones, ur: NEGATIVE mg/dL
Leukocytes,Ua: NEGATIVE
Nitrite: NEGATIVE
Protein, ur: NEGATIVE mg/dL
Specific Gravity, Urine: 1.009 (ref 1.005–1.030)
pH: 5 (ref 5.0–8.0)

## 2024-01-30 LAB — TROPONIN I (HIGH SENSITIVITY)
Troponin I (High Sensitivity): 30 ng/L — ABNORMAL HIGH (ref <18)
Troponin I (High Sensitivity): 58 ng/L — ABNORMAL HIGH (ref <18)

## 2024-01-30 LAB — BRAIN NATRIURETIC PEPTIDE: B Natriuretic Peptide: 576.5 pg/mL — ABNORMAL HIGH (ref 0.0–100.0)

## 2024-01-30 LAB — SEDIMENTATION RATE: Sed Rate: 58 mm/h — ABNORMAL HIGH (ref 0–22)

## 2024-01-30 MED ORDER — LORAZEPAM 2 MG/ML IJ SOLN
1.0000 mg | Freq: Once | INTRAMUSCULAR | Status: AC
  Administered 2024-01-30: 1 mg via INTRAVENOUS
  Filled 2024-01-30: qty 1

## 2024-01-30 MED ORDER — FUROSEMIDE 10 MG/ML IJ SOLN
40.0000 mg | Freq: Once | INTRAMUSCULAR | Status: AC
  Administered 2024-01-30: 40 mg via INTRAVENOUS
  Filled 2024-01-30: qty 4

## 2024-01-30 NOTE — ED Triage Notes (Signed)
 EMS called out today for left arm/jaw/teeth pain that started yesterday. Pain increased today. Hurts more with movement and palpation. Daughter states she increased anxiety and daughter believes she has a UTI. EMS states that she has asked multiple times to use the bathroom. Pt is alert and at baseline to herself.  104/60 100 Afib 26RR BS 126 93%

## 2024-01-30 NOTE — ED Provider Notes (Signed)
 [place] [facility] AT [facility] Provider Note   CSN: [id number] Arrival date & time: 01/30/24  1619     History  Chief Complaint  Patient presents with   Neck Pain   Anxiety    [name] is a 89 y.o. female.  Patient is a 89 year old female who reportedly presents for left arm pain.  Per chart review, she has a history of atrial fibrillation off anticoagulants due to prior GI bleed, hypertension, CHF, chronic respiratory failure on home oxygen at 2 L/min, she complains of chronic pain and increased anxiety.  Reportedly there was some concern for a urinary tract infection.  Patient denies any complaints.  She denies any chest pain.  No arm pain.  No neck pain.  When asked why she is here she says "I think they were just trying to get rid of me".  Per chart review, she has a history of cognitive decline.  I attempted to contact her daughter/POA and it went to voicemail.  17:52: Daughter called.  Pt recently had elevated ESR, saw Dr. Jolena Nay with ophthalmology due to decreased vision, felt pt has temporal arteritis.  Was started on prednisone.  Today, pt was more jittery, not acting right, complaining of pain in left face/jaw.  Had one episode of vomiting.       Home Medications Prior to Admission medications   Medication Sig Start Date End Date Taking? Authorizing Provider  acetaminophen  (TYLENOL ) 325 MG tablet Take 2 tablets (650 mg total) by mouth every 6 (six) hours as needed for mild pain or headache. Patient taking differently: Take 650 mg by mouth daily as needed for mild pain (pain score 1-3), headache, fever or moderate pain (pain score 4-6). 11/06/22   Gonfa, Taye T, MD  ascorbic acid  (VITAMIN C ) 500 MG tablet Take 500 mg by mouth 3 (three) times a week.    [provider]  atorvastatin  (LIPITOR) 10 MG tablet TAKE 1 TABLET BY MOUTH EVERY DAY 12/21/23   Robins, Joshua E, MD  bumetanide  (BUMEX ) 1 MG tablet Take 1 mg by mouth 2 (two) times  daily. 09/11/23   [provider]  carboxymethylcellulose (REFRESH PLUS) 0.5 % SOLN Place 1 drop into both eyes 2 (two) times daily.    [provider]  clotrimazole -betamethasone (LOTRISONE) cream Apply 1 Application topically 2 (two) times daily as needed (tears on skin). 09/14/23   [provider]  cyanocobalamin  (VITAMIN B12) 1000 MCG tablet Take 1,000 mcg by mouth daily.    [provider]  diclofenac  Sodium (VOLTAREN ) 1 % GEL Apply 4 g topically 4 (four) times daily. To bilateral feet and knees Patient taking differently: Apply 4 g topically 4 (four) times daily as needed (pain). To bilateral feet and knees 11/06/22   Carry Clapper T, MD  estradiol  (ESTRACE ) 0.1 MG/GM vaginal cream Place 1 Applicatorful vaginally every other day. 04/21/23   [provider]  ferrous sulfate 325 (65 FE) MG EC tablet Take 325 mg by mouth 3 (three) times a week.    [provider]  gabapentin  (NEURONTIN ) 300 MG capsule Take 300 mg by mouth 2 (two) times daily.     [provider]  levothyroxine  (SYNTHROID ) 100 MCG tablet Take 100 mcg by mouth daily before breakfast. 09/05/21   [provider]  Multiple Vitamins-Minerals (PRESERVISION/LUTEIN) CAPS Take 1 capsule by mouth 2 (two) times daily.     [provider]  mupirocin  cream (BACTROBAN ) 2 % Apply 1 Application topically daily  as needed (dry nose).    [provider]  ondansetron  (ZOFRAN ) 4 MG tablet Take 4 mg by mouth daily as needed. 04/17/23   [provider]  OVER THE COUNTER MEDICATION Take 1 capsule by mouth at bedtime. Equate Neuriva    [provider]  pantoprazole  (PROTONIX ) 40 MG tablet Take 1 tablet (40 mg total) by mouth 2 (two) times daily. 09/01/23 12/06/23  Modena Andes, MD  senna-docusate (SENOKOT-S) 8.6-50 MG tablet Take 1 tablet by mouth 2 (two) times daily between meals as needed for mild constipation. Patient taking differently: Take 1 tablet by  mouth at bedtime. 11/06/22   Gonfa, Taye T, MD  sodium chloride  1 g tablet Take 1 g by mouth 2 (two) times daily. Patient not taking: Reported on 12/06/2023 11/16/23   [provider]  spironolactone  (ALDACTONE ) 25 MG tablet TAKE 1/2 TABLET BY MOUTH EVERY DAY 09/30/23   Sheryle Donning, MD      Allergies    Amoxicillin, Azithromycin, Codeine, Erythromycin, Green dyes, Iodine , Misc. sulfonamide containing compounds, Oxycodone, Oxycodone-acetaminophen , Penicillins, Sulfa antibiotics, Sulfasalazine, and Tramadol    Review of Systems   Review of Systems  Unable to perform ROS: Dementia    Physical Exam Updated Vital Signs BP (!) 117/55 (BP Location: Right Arm)   Pulse 72   Temp 98.6 F (37 C) (Oral)   Resp (!) 22   Ht 5\' 3"  (1.6 m)   Wt 77.1 kg   SpO2 92%   BMI 30.11 kg/m  Physical Exam Constitutional:      Appearance: She is well-developed.  HENT:     Head: Normocephalic and atraumatic.  Eyes:     Pupils: Pupils are equal, round, and reactive to light.  Neck:     Comments: No pain in the neck or paracervical areas Cardiovascular:     Rate and Rhythm: Normal rate and regular rhythm.     Heart sounds: Normal heart sounds.  Pulmonary:     Effort: Pulmonary effort is normal. No respiratory distress.     Breath sounds: Normal breath sounds. No wheezing or rales.  Chest:     Chest wall: No tenderness.  Abdominal:     General: Bowel sounds are normal.     Palpations: Abdomen is soft.     Tenderness: There is no abdominal tenderness. There is no guarding or rebound.  Musculoskeletal:        General: Normal range of motion.     Cervical back: Normal range of motion and neck supple.     Comments: No pain with range of motion of the extremities  Lymphadenopathy:     Cervical: No cervical adenopathy.  Skin:    General: Skin is warm and dry.     Findings: No rash.  Neurological:     General: No focal deficit present.     Mental Status: She is alert.      Comments: Oriented to person and place only, moves all extremities symmetrically without obvious focal deficits     ED Results / Procedures / Treatments   Labs (all labs ordered are listed, but only abnormal results are displayed) Labs Reviewed  CBC WITH DIFFERENTIAL/PLATELET - Abnormal; Notable for the following components:      Result Value   WBC 16.4 (*)    RBC 3.48 (*)    Hemoglobin 9.5 (*)    HCT 31.1 (*)    RDW 19.2 (*)    Platelets 126 (*)    Neutro Abs 15.9 (*)  Lymphs Abs 0.2 (*)    Abs Immature Granulocytes 0.10 (*)    All other components within normal limits  COMPREHENSIVE METABOLIC PANEL WITH GFR - Abnormal; Notable for the following components:   Sodium 133 (*)    Chloride 93 (*)    Glucose, Bld 116 (*)    Creatinine, Ser 1.16 (*)    Albumin 2.8 (*)    AST 44 (*)    Total Bilirubin 1.3 (*)    GFR, Estimated 44 (*)    All other components within normal limits  BRAIN NATRIURETIC PEPTIDE - Abnormal; Notable for the following components:   B Natriuretic Peptide 576.5 (*)    All other components within normal limits  TROPONIN I (HIGH SENSITIVITY) - Abnormal; Notable for the following components:   Troponin I (High Sensitivity) 30 (*)    All other components within normal limits  URINALYSIS, ROUTINE W REFLEX MICROSCOPIC  SEDIMENTATION RATE  TROPONIN I (HIGH SENSITIVITY)    EKG EKG Interpretation Date/Time:  Saturday Jan 30 2024 16:30:09 EDT Ventricular Rate:  108 PR Interval:    QRS Duration:  93 QT Interval:  335 QTC Calculation: 460 R Axis:   207  Text Interpretation: Atrial fibrillation Ventricular premature complex Right ventricular hypertrophy Abnormal lateral Q waves ST depression, consider ischemia, lateral lds Confirmed by Hershel Los [phone]) on 01/30/2024 4:41:47 PM  Radiology DG Chest 2 View Result Date: 01/30/2024 CLINICAL DATA:  Short of breath EXAM: CHEST - 2 VIEW COMPARISON:  12/05/2023 FINDINGS: Frontal and lateral views of the chest  demonstrates stable enlargement of the cardiac silhouette. Continued ectasia and atherosclerosis of the thoracic aorta. There is central pulmonary vascular congestion, with diffuse interstitial opacities and bibasilar ground-glass airspace disease consistent with edema. Small bilateral effusions. No pneumothorax. IMPRESSION: 1. Findings consistent with congestive heart failure. Worsening volume status since prior exam. Electronically Signed   By: Bobbye Burrow M.D.   On: 01/30/2024 17:40    Procedures Procedures    Medications Ordered in ED Medications  furosemide  (LASIX ) injection 40 mg (has no administration in time range)  LORazepam  (ATIVAN ) injection 1 mg (has no administration in time range)    ED Course/ Medical Decision Making/ A&P                                 Medical Decision Making Amount and/or Complexity of Data Reviewed Labs: ordered. Radiology: ordered.  Risk Prescription drug management.   Patient is a 89 year old who presents with left side head and neck pain.  History is obtained from the patient's daughter.  There was concern concern over the last few days for temporal arteritis.  Patient has had a decrease in her vision over the last month that the daughters noticed.  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  She was told that the patient's ESR was elevated.  She thinks that it was elevated at 76 but I do not have access to these records.  She had an urgent visit to Dr. Jolena Nay with ophthalmology who was concerned about temporal arteritis due to the eye findings and started the patient on prednisone 20 mg 3 times daily per the daughter.  I discussed this with Dr. Cleone Dad with neurology.  She does feel that it is difficult to make a diagnosis of temporal arteritis based on elevated sed rate in a patient  who has dementia.  There is some concerning signs and that she seemed to have  some pain in the left side of her head and has been complaining of some left-sided headaches per the daughter.  However her sed rate was also noted to be elevated on chart review about 9 months ago at 86.  She recommended doing an MRI.  We are also going to do a CTA of the head and neck but she has an allergy to iodine .  Will do the MRI and will need to consult back to neurology following this.   Care turned over to Dr. Nora Beal pending MRI and consult with neurology.  She also did have some evidence of increased vasculature/pulmonary edema on her chest x-ray.  Her BNP is elevated.  She does not have any increased oxygen demand or increased work of breathing.  Will give her a dose of Lasix  in the ED.  Her first troponin was mildly elevated.  Awaiting repeat.  No ischemic changes were noted on her EKG.  Final Clinical Impression(s) / ED Diagnoses Final diagnoses:  Acute on chronic congestive heart failure, unspecified heart failure type (HCC)  Left-sided headache    Rx / DC Orders ED Discharge Orders     None         Hershel Los, MD 01/30/24 2051

## 2024-01-30 NOTE — ED Notes (Signed)
 Troponin 58 Belfi MD made aware.

## 2024-01-30 NOTE — ED Notes (Signed)
Pt assisted with using the bedside commode.  

## 2024-01-30 NOTE — ED Notes (Signed)
 Bck frm MRI

## 2024-01-30 NOTE — ED Provider Notes (Signed)
 Care assumed from Dr. Nora Beal, patient with right facial pain for two days, vision loss on the left side, has elevated ESR. She saw an ophthalmologist who started prednisone. Pain is worse today. MRI is pending. Need to discus with neurology.  MRI shows no acute intracranial abnormality.  Have independently viewed the images, and agree with the radiologist's interpretation.  I have explained this to the daughter and also explained the need to continue on prednisone and obtain a temporal artery biopsy as soon as possible.  Daughter mentioned numerous other complaints.  She apparently is complaining of some aching in her shoulders and back and also complaining of anal pain.  I suspect that she may have polymyalgia rheumatica along with her temporal arteritis.  Prednisone is appropriate to treat both.  I did do a rectal exam and did not see any pathology that may explain rectal pain.  Specifically, no fissure or hemorrhoid.  I am referring her to vascular surgery to get temporal artery biopsy done and referring her to gastroenterology for further evaluation of rectal pain.   Imaging Results     MR BRAIN WO CONTRAST (Final result) Result time 01/30/24 23:43:51 Final result by Stevenson Elbe, MD (01/30/24 23:43:51)           Narrative:   CLINICAL DATA:  Neuro deficit, acute, stroke suspected  EXAM: MRI HEAD WITHOUT CONTRAST  TECHNIQUE: Multiplanar, multiecho pulse sequences of the brain and surrounding structures were obtained without intravenous contrast.  COMPARISON:  None Available.  FINDINGS: Brain: No acute infarction, hemorrhage, hydrocephalus, extra-axial collection or mass lesion. Cerebral atrophy. Patchy T2/FLAIR hyperintensities in the white matter, compatible with chronic microvascular ischemic change.  Vascular: Major arterial flow voids are maintained at the skull base.  Skull and upper cervical spine: Normal marrow signal.  Sinuses/Orbits: Clear sinuses.  No acute  orbital findings.  Other: Left mastoid effusion.  IMPRESSION: No evidence of acute intracranial abnormality.   Electronically Signed   By: Stevenson Elbe M.D.   On: 01/30/2024 23:43               Alissa April, MD 01/31/24 0127

## 2024-01-30 NOTE — ED Provider Notes (Signed)
 Patient signed out to me at 2100 by Dr. Nolia Baumgartner pending MRI. In short this is a 89 year old female with PMH dementia, CHF on 2L home O2 that presented to the ED with increased L-arm pain and head pain. Reportedly had outpatient work up with PCP and ophthalmology concerning for temporal arteritis and was started on prednisone. Symptoms worsening and she came in tonight. Previous provider spoke with neurology who recommended repeat labs and MRI. Plan to reconsult neuro once MRI is resulted. On my evaluation patient is awake, following commands in all 4 extremities, unclear if she has any temporal tenderness to percussion. MRI is here to take her down.  11:38 PM Patient signed out to Dr. Candelaria Chaco pending MRI and reassessment with plan for discuss with neurology recommendations for disposition.   Kingsley, Ryka Beighley K, DO 01/30/24 2339

## 2024-01-31 NOTE — Discharge Instructions (Addendum)
 The MRI tonight did not show any signs of stroke or any other serious problem.  The main concern is that there may be an inflammation in the blood vessels going to the eyes called temporal arteritis.  This can cause headaches and can also cause blindness.  It is very important that you continue taking prednisone, and get a biopsy done to see if indeed you do have temporal arteritis.  A condition associated with this called polymyalgia rheumatica can cause weakness and aching of multiple muscles.  I did not see a clear cause for the pain in your butt.  I am referring you to a gastroenterologist for further evaluation.

## 2024-01-31 NOTE — ED Notes (Signed)
 PT was able to stand and sit on bedside with x1 assist. PT was able to tell name and help get dressed. PT VSS and no other needs noted. PT daughter went home to get O2 supply for PT to wear at baseline.

## 2024-02-02 ENCOUNTER — Telehealth: Payer: Self-pay | Admitting: Vascular Surgery

## 2024-02-04 NOTE — Progress Notes (Signed)
 I was sent a message regarding the patient's recent ED visit noting concern for temporal arteritis with possibly polymyalgia rheumatica.  [name] is well-known to me, having undergone lower extremity interventions for critical limb ischemia with tissue loss.  At our last office visit, she was very frail, oxygen dependent and wheelchair-bound.  I thought that her life expectancy was less than a year.  We had a long discussion which included her daughter. She stated she wanted no further surgeries.  In an effort to diagnose temporal arteritis, we will need to move to the next best modality which is ultrasound.  Should the halo sign be demonstrated, will treat.  If a halo sign is not demonstrated, we will have to discuss the recent benefits of continuing treatment versus moving to temporal artery biopsy.  I plan to follow-up the ultrasound and discussed the findings with both her and her daughter.  Kayla Part MD

## 2024-02-05 ENCOUNTER — Other Ambulatory Visit: Payer: Self-pay | Admitting: *Deleted

## 2024-02-05 DIAGNOSIS — G441 Vascular headache, not elsewhere classified: Secondary | ICD-10-CM

## 2024-02-08 ENCOUNTER — Telehealth: Payer: Self-pay

## 2024-02-08 NOTE — Telephone Encounter (Signed)
 Pt's daughter called asking about her mother taking prednisone. The prednisone was ordered by pt's eye doctor so I advised her to call the eye doctor to manage that.    Daughter stated that after being started on the prednisone on 01/29/24, the patient started complaining of arm pain and began vomiting and was taken to the ED on 01/30/24 where she had a Head CT.  Daughter stated that the CT was "normal."  After that episode, the patient began to decline. Daughter stated she became "a limp noodle." Pt's appetite has decreased and she's only drinking protein shakes.  She is also requiring full care to change her clothing and meet her toileting needs.  The patient had an OT evaluation on 01/3024 and the daughter said the decline was notable. The family has an appt with Hospice today, 02/08/24.    Will update Dr. Rosalva Comber about pt's condition.

## 2024-02-09 ENCOUNTER — Ambulatory Visit (HOSPITAL_COMMUNITY)

## 2024-02-11 ENCOUNTER — Telehealth: Payer: Self-pay | Admitting: Cardiology

## 2024-02-11 NOTE — Telephone Encounter (Signed)
 Pt's daughter calling to let Dr Veryl Gottron know that her mother passed away on 2024-02-18 @ 11:53am

## 2024-02-12 NOTE — Telephone Encounter (Signed)
 Daughter called you inform [facility] that pt is now deceased.

## 2024-02-24 ENCOUNTER — Encounter (HOSPITAL_BASED_OUTPATIENT_CLINIC_OR_DEPARTMENT_OTHER): Admitting: General Surgery

## 2024-03-08 DEATH — deceased

## 2024-05-31 NOTE — Progress Notes (Shared)
 [facility] - Clinic Note  06/14/2024     CHIEF COMPLAINT Patient presents for No chief complaint on file.   HISTORY OF PRESENT ILLNESS: [name] is a 89 y.o. female who presents to the clinic today for:    Patient states vision blurred. Can't see words in puzzle book. Also has difficulty seeing TV. Colors like red are difficult to distinguish.   Referring physician: Dyane Anthony RAMAN, FNP [address] [place],  [place] [address]  HISTORICAL INFORMATION:   Selected notes from the MEDICAL RECORD NUMBER Referred by Dr. Jacques for ret eval OU   CURRENT MEDICATIONS: Current Outpatient Medications (Ophthalmic Drugs)  Medication Sig   carboxymethylcellulose (REFRESH PLUS) 0.5 % SOLN Place 1 drop into both eyes 2 (two) times daily.   No current facility-administered medications for this visit. (Ophthalmic Drugs)   Current Outpatient Medications (Other)  Medication Sig   acetaminophen  (TYLENOL ) 325 MG tablet Take 2 tablets (650 mg total) by mouth every 6 (six) hours as needed for mild pain or headache. (Patient taking differently: Take 650 mg by mouth daily as needed for mild pain (pain score 1-3), headache, fever or moderate pain (pain score 4-6).)   ascorbic acid  (VITAMIN C ) 500 MG tablet Take 500 mg by mouth 3 (three) times a week.   atorvastatin  (LIPITOR) 10 MG tablet TAKE 1 TABLET BY MOUTH EVERY DAY   bumetanide  (BUMEX ) 1 MG tablet Take 1 mg by mouth 2 (two) times daily.   clotrimazole -betamethasone (LOTRISONE) cream Apply 1 Application topically 2 (two) times daily as needed (tears on skin).   cyanocobalamin  (VITAMIN B12) 1000 MCG tablet Take 1,000 mcg by mouth daily.   diclofenac  Sodium (VOLTAREN ) 1 % GEL Apply 4 g topically 4 (four) times daily. To bilateral feet and knees (Patient taking differently: Apply 4 g topically 4 (four) times daily as needed (pain). To bilateral feet and knees)   estradiol  (ESTRACE ) 0.1 MG/GM vaginal cream  Place 1 Applicatorful vaginally every other day.   ferrous sulfate 325 (65 FE) MG EC tablet Take 325 mg by mouth 3 (three) times a week.   gabapentin  (NEURONTIN ) 300 MG capsule Take 300 mg by mouth 2 (two) times daily.    levothyroxine  (SYNTHROID ) 100 MCG tablet Take 100 mcg by mouth daily before breakfast.   Multiple Vitamins-Minerals (PRESERVISION/LUTEIN) CAPS Take 1 capsule by mouth 2 (two) times daily.    mupirocin  cream (BACTROBAN ) 2 % Apply 1 Application topically daily as needed (dry nose).   ondansetron  (ZOFRAN ) 4 MG tablet Take 4 mg by mouth daily as needed.   OVER THE COUNTER MEDICATION Take 1 capsule by mouth at bedtime. Equate Neuriva   pantoprazole  (PROTONIX ) 40 MG tablet Take 1 tablet (40 mg total) by mouth 2 (two) times daily.   senna-docusate (SENOKOT-S) 8.6-50 MG tablet Take 1 tablet by mouth 2 (two) times daily between meals as needed for mild constipation. (Patient taking differently: Take 1 tablet by mouth at bedtime.)   sodium chloride  1 g tablet Take 1 g by mouth 2 (two) times daily. (Patient not taking: Reported on 12/06/2023)   spironolactone  (ALDACTONE ) 25 MG tablet TAKE 1/2 TABLET BY MOUTH EVERY DAY   No current facility-administered medications for this visit. (Other)   REVIEW OF SYSTEMS:   ALLERGIES Allergies  Allergen Reactions   Amoxicillin Other (See Comments)    Unknown reaction  Tolerates Keflex , Cefepime    Azithromycin Other (See Comments)    Unknown reaction  Codeine Other (See Comments)    Unknown reaction    Erythromycin Other (See Comments)    Unknown reaction    Green Dyes Other (See Comments)    Allergic to ALL dyes   Iodine  Other (See Comments)    Unknown reaction    Misc. Sulfonamide Containing Compounds Other (See Comments)    Unknown    Oxycodone Other (See Comments)    Unknown reaction      Oxycodone-Acetaminophen  Other (See Comments)    Unknown reaction    Penicillins Other (See Comments)    Unknown reaction  Tolerates  Keflex , Cefepime    Sulfa Antibiotics     Per Pt's daughter - unknown reaction    Sulfasalazine     Unsure of allergy    Tramadol Hives   PAST MEDICAL HISTORY Past Medical History:  Diagnosis Date   Anticoagulant long-term use    Arrhythmia    Arthritis    Atrial fibrillation (HCC)    Congestive heart failure (CHF) (HCC)    Dysphagia    Esophageal reflux    Essential hypertension    Hiatal hernia    Pulmonary embolus (HCC)    Thyroid disease    Past Surgical History:  Procedure Laterality Date   ABDOMINAL AORTOGRAM W/LOWER EXTREMITY N/A 03/04/2023   Procedure: ABDOMINAL AORTOGRAM W/LOWER EXTREMITY;  Surgeon: Lanis Fonda BRAVO, MD;  Location: [facility] INVASIVE CV LAB;  Service: Cardiovascular;  Laterality: N/A;   AMPUTATION TOE Right 11/17/2021   Procedure: RIGHT SECOND TOE AMPUTATION;  Surgeon: Kit Rush, MD;  Location: [facility] ORS;  Service: Orthopedics;  Laterality: Right;   COLONOSCOPY     ELBOW SURGERY     LAPAROSCOPIC HYSTERECTOMY     FAMILY HISTORY Family History  Problem Relation Age of Onset   Stroke Mother    Cancer Brother    SOCIAL HISTORY Social History   Tobacco Use   Smoking status: Former    Current packs/day: 0.00    Types: Cigarettes    Quit date: 10/02/2001    Years since quitting: 22.6   Smokeless tobacco: Never   Tobacco comments:    former smoker  Vaping Use   Vaping status: Never Used  Substance Use Topics   Alcohol  use: Never   Drug use: Never       OPHTHALMIC EXAM:  Not recorded     IMAGING AND PROCEDURES  Imaging and Procedures for 06/14/2024          ASSESSMENT/PLAN:  No diagnosis found.  1. Age related macular degeneration, non-exudative, with +GA, both eyes (OS>OD)  - focal peripapillary ORA OU  - BCVA OD improved from 20/80 to 20/70; OS improved from 20/150 to 20/60  - OCT shows Mild progression of ORA/GA greatest superior and nasal mac OU -- seen best on en face images  - Cont amsler grid monitoring and AREDS2  supplements  - f/u 6-9 mos, sooner prn - DFE, OCT  2,3. Hypertensive retinopathy OU - discussed importance of tight BP control - monitor  4. Pseudophakia OU  - s/p CE/IOL OU  - IOLs in good position  - s/p YAG Cap OD (Dr. Jacques) - s/p YAG Cap OS (05.01.24 - Dr. Valdemar)  5. Dry eyes OU  - recommend artificial tears and lubricating ointment as needed   Ophthalmic Meds Ordered this visit:  No orders of the defined types were placed in this encounter.    No follow-ups on file.  There are no Patient Instructions on file for this visit.  Explained the diagnoses, plan, and follow up with the patient and they expressed understanding.  Patient expressed understanding of the importance of proper follow up care.  This document serves as a record of services personally performed by Redell JUDITHANN Hans, MD, PhD. It was created on their behalf by Wanda Keens, COT an ophthalmic technician. The creation of this record is the provider's dictation and/or activities during the visit.    Electronically signed by:  Wanda GEANNIE Keens, COT  05/31/24 8:55 AM  Redell JUDITHANN Hans, M.D., Ph.D. Diseases & Surgery of the Retina and Vitreous [facility]    Abbreviations: M myopia (nearsighted); A astigmatism; H hyperopia (farsighted); P presbyopia; Mrx spectacle prescription;  CTL contact lenses; OD right eye; OS left eye; OU both eyes  XT exotropia; ET esotropia; PEK punctate epithelial keratitis; PEE punctate epithelial erosions; DES dry eye syndrome; MGD meibomian gland dysfunction; ATs artificial tears; PFAT's preservative free artificial tears; NSC nuclear sclerotic cataract; PSC posterior subcapsular cataract; ERM epi-retinal membrane; PVD posterior vitreous detachment; RD retinal detachment; DM diabetes mellitus; DR diabetic retinopathy; NPDR non-proliferative diabetic retinopathy; PDR proliferative diabetic retinopathy; CSME clinically significant macular edema; DME  diabetic macular edema; dbh dot blot hemorrhages; CWS cotton wool spot; POAG primary open angle glaucoma; C/D cup-to-disc ratio; HVF humphrey visual field; GVF goldmann visual field; OCT optical coherence tomography; IOP intraocular pressure; BRVO Branch retinal vein occlusion; CRVO central retinal vein occlusion; CRAO central retinal artery occlusion; BRAO branch retinal artery occlusion; RT retinal tear; SB scleral buckle; PPV pars plana vitrectomy; VH Vitreous hemorrhage; PRP panretinal laser photocoagulation; IVK intravitreal kenalog; VMT vitreomacular traction; MH Macular hole;  NVD neovascularization of the disc; NVE neovascularization elsewhere; AREDS age related eye disease study; ARMD age related macular degeneration; POAG primary open angle glaucoma; EBMD epithelial/anterior basement membrane dystrophy; ACIOL anterior chamber intraocular lens; IOL intraocular lens; PCIOL posterior chamber intraocular lens; Phaco/IOL phacoemulsification with intraocular lens placement; PRK photorefractive keratectomy; LASIK laser assisted in situ keratomileusis; HTN hypertension; DM diabetes mellitus; COPD chronic obstructive pulmonary disease

## 2024-06-14 ENCOUNTER — Encounter (INDEPENDENT_AMBULATORY_CARE_PROVIDER_SITE_OTHER): Admitting: Ophthalmology

## 2024-06-14 DIAGNOSIS — Z961 Presence of intraocular lens: Secondary | ICD-10-CM

## 2024-06-14 DIAGNOSIS — H04123 Dry eye syndrome of bilateral lacrimal glands: Secondary | ICD-10-CM

## 2024-06-14 DIAGNOSIS — H353133 Nonexudative age-related macular degeneration, bilateral, advanced atrophic without subfoveal involvement: Secondary | ICD-10-CM

## 2024-06-14 DIAGNOSIS — I10 Essential (primary) hypertension: Secondary | ICD-10-CM

## 2024-06-14 DIAGNOSIS — H26492 Other secondary cataract, left eye: Secondary | ICD-10-CM

## 2024-06-14 DIAGNOSIS — H35033 Hypertensive retinopathy, bilateral: Secondary | ICD-10-CM
